# Patient Record
Sex: Male | Born: 1935 | Race: Black or African American | Hispanic: No | Smoking: Former smoker
Health system: Southern US, Community
[De-identification: ages and names within clinical notes are randomized; demographics above are authoritative.]

## PROBLEM LIST (undated history)

## (undated) DIAGNOSIS — D509 Iron deficiency anemia, unspecified: Principal | ICD-10-CM

## (undated) DIAGNOSIS — Z87442 Personal history of urinary calculi: Secondary | ICD-10-CM

## (undated) DIAGNOSIS — I471 Supraventricular tachycardia, unspecified: Secondary | ICD-10-CM

## (undated) DIAGNOSIS — I5032 Chronic diastolic (congestive) heart failure: Secondary | ICD-10-CM

## (undated) DIAGNOSIS — I10 Essential (primary) hypertension: Principal | ICD-10-CM

## (undated) DIAGNOSIS — I498 Other specified cardiac arrhythmias: Secondary | ICD-10-CM

## (undated) DIAGNOSIS — Z950 Presence of cardiac pacemaker: Secondary | ICD-10-CM

## (undated) DIAGNOSIS — G6181 Chronic inflammatory demyelinating polyneuritis: Secondary | ICD-10-CM

## (undated) DIAGNOSIS — C7951 Secondary malignant neoplasm of bone: Secondary | ICD-10-CM

## (undated) DIAGNOSIS — C9000 Multiple myeloma not having achieved remission: Secondary | ICD-10-CM

## (undated) DIAGNOSIS — F17200 Nicotine dependence, unspecified, uncomplicated: Secondary | ICD-10-CM

## (undated) DIAGNOSIS — J45909 Unspecified asthma, uncomplicated: Secondary | ICD-10-CM

## (undated) DIAGNOSIS — Z87898 Personal history of other specified conditions: Secondary | ICD-10-CM

## (undated) HISTORY — DX: Presence of cardiac pacemaker: Z95.0

## (undated) HISTORY — DX: Other specified cardiac arrhythmias: I49.8

## (undated) HISTORY — DX: Secondary malignant neoplasm of bone: C79.51

## (undated) HISTORY — DX: Personal history of urinary calculi: Z87.442

## (undated) HISTORY — DX: Personal history of other specified conditions: Z87.898

## (undated) HISTORY — DX: Multiple myeloma not having achieved remission: C90.00

## (undated) HISTORY — DX: Essential (primary) hypertension: I10

## (undated) HISTORY — DX: Nicotine dependence, unspecified, uncomplicated: F17.200

## (undated) HISTORY — DX: Iron deficiency anemia, unspecified: D50.9

## (undated) HISTORY — PX: COLONOSCOPY: SHX174

## (undated) HISTORY — DX: Unspecified asthma, uncomplicated: J45.909

---

## 1960-05-13 HISTORY — PX: SKIN GRAFT: SHX250

## 2003-05-14 DIAGNOSIS — I498 Other specified cardiac arrhythmias: Secondary | ICD-10-CM

## 2003-05-14 DIAGNOSIS — Z950 Presence of cardiac pacemaker: Secondary | ICD-10-CM

## 2003-05-14 HISTORY — DX: Presence of cardiac pacemaker: Z95.0

## 2003-05-14 HISTORY — PX: PACEMAKER INSERTION: SHX728

## 2003-05-14 HISTORY — DX: Other specified cardiac arrhythmias: I49.8

## 2005-10-03 ENCOUNTER — Ambulatory Visit: Payer: Self-pay | Admitting: Internal Medicine

## 2006-01-16 ENCOUNTER — Ambulatory Visit: Payer: Self-pay | Admitting: Internal Medicine

## 2006-05-19 ENCOUNTER — Ambulatory Visit: Payer: Self-pay | Admitting: Internal Medicine

## 2006-11-21 ENCOUNTER — Telehealth (INDEPENDENT_AMBULATORY_CARE_PROVIDER_SITE_OTHER): Payer: Self-pay

## 2006-11-21 ENCOUNTER — Encounter: Payer: Self-pay | Admitting: Internal Medicine

## 2006-11-21 ENCOUNTER — Ambulatory Visit: Payer: Self-pay | Admitting: Internal Medicine

## 2006-11-21 DIAGNOSIS — I10 Essential (primary) hypertension: Secondary | ICD-10-CM

## 2006-11-21 DIAGNOSIS — Z87442 Personal history of urinary calculi: Secondary | ICD-10-CM

## 2006-11-21 DIAGNOSIS — N400 Enlarged prostate without lower urinary tract symptoms: Secondary | ICD-10-CM

## 2006-11-21 DIAGNOSIS — Z86010 Personal history of colonic polyps: Secondary | ICD-10-CM

## 2006-11-21 HISTORY — DX: Essential (primary) hypertension: I10

## 2006-11-21 HISTORY — DX: Personal history of urinary calculi: Z87.442

## 2006-11-21 LAB — CONVERTED CEMR LAB
ALT: 18 units/L (ref 0–53)
AST: 26 units/L (ref 0–37)
Alkaline Phosphatase: 69 units/L (ref 39–117)
BUN: 12 mg/dL (ref 6–23)
Basophils Relative: 0.7 % (ref 0.0–1.0)
CO2: 31 meq/L (ref 19–32)
Calcium: 9.3 mg/dL (ref 8.4–10.5)
Chloride: 104 meq/L (ref 96–112)
Cholesterol: 204 mg/dL (ref 0–200)
Creatinine, Ser: 1 mg/dL (ref 0.4–1.5)
Hemoglobin: 12.5 g/dL — ABNORMAL LOW (ref 13.0–17.0)
Monocytes Absolute: 0.4 10*3/uL (ref 0.2–0.7)
Monocytes Relative: 10.6 % (ref 3.0–11.0)
Platelets: 185 10*3/uL (ref 150–400)
RBC: 4.35 M/uL (ref 4.22–5.81)
RDW: 13.5 % (ref 11.5–14.6)
Total Bilirubin: 1 mg/dL (ref 0.3–1.2)
Total CHOL/HDL Ratio: 2.7
Total Protein: 6.8 g/dL (ref 6.0–8.3)
VLDL: 14 mg/dL (ref 0–40)

## 2006-12-02 ENCOUNTER — Ambulatory Visit: Payer: Self-pay | Admitting: Internal Medicine

## 2007-03-09 ENCOUNTER — Encounter: Payer: Self-pay | Admitting: Internal Medicine

## 2007-03-19 ENCOUNTER — Ambulatory Visit: Payer: Self-pay | Admitting: Internal Medicine

## 2007-05-25 ENCOUNTER — Ambulatory Visit: Payer: Self-pay | Admitting: Internal Medicine

## 2007-06-17 ENCOUNTER — Ambulatory Visit: Payer: Self-pay | Admitting: Internal Medicine

## 2007-11-03 ENCOUNTER — Ambulatory Visit: Payer: Self-pay | Admitting: Internal Medicine

## 2007-11-24 ENCOUNTER — Ambulatory Visit: Payer: Self-pay | Admitting: Internal Medicine

## 2007-11-24 LAB — CONVERTED CEMR LAB
ALT: 21 units/L (ref 0–53)
AST: 29 units/L (ref 0–37)
Basophils Relative: 0.7 % (ref 0.0–1.0)
Bilirubin, Direct: 0.1 mg/dL (ref 0.0–0.3)
CO2: 28 meq/L (ref 19–32)
Calcium: 9.4 mg/dL (ref 8.4–10.5)
Chloride: 105 meq/L (ref 96–112)
Creatinine, Ser: 0.9 mg/dL (ref 0.4–1.5)
Glucose, Bld: 91 mg/dL (ref 70–99)
HDL: 74.4 mg/dL (ref >39.0)
Hemoglobin: 12.9 g/dL — ABNORMAL LOW (ref 13.0–17.0)
Lymphocytes Relative: 43 % (ref 12.0–46.0)
Monocytes Relative: 11.3 % (ref 3.0–12.0)
Neutro Abs: 1.5 10*3/uL (ref 1.4–7.7)
Neutrophils Relative %: 41.8 % — ABNORMAL LOW (ref 43.0–77.0)
PSA: 0.86 ng/mL (ref 0.10–4.00)
RBC: 4.4 M/uL (ref 4.22–5.81)
Sodium: 141 meq/L (ref 135–145)
Total Bilirubin: 0.9 mg/dL (ref 0.3–1.2)
Total CHOL/HDL Ratio: 2.8
Total Protein: 6.6 g/dL (ref 6.0–8.3)
VLDL: 11 mg/dL (ref 0–40)

## 2007-12-07 ENCOUNTER — Ambulatory Visit: Payer: Self-pay | Admitting: Gastroenterology

## 2007-12-23 ENCOUNTER — Ambulatory Visit: Payer: Self-pay | Admitting: Gastroenterology

## 2008-06-02 ENCOUNTER — Telehealth: Payer: Self-pay | Admitting: Internal Medicine

## 2008-06-03 ENCOUNTER — Ambulatory Visit: Payer: Self-pay | Admitting: Internal Medicine

## 2008-09-01 ENCOUNTER — Encounter (INDEPENDENT_AMBULATORY_CARE_PROVIDER_SITE_OTHER): Payer: Self-pay | Admitting: *Deleted

## 2008-10-25 DIAGNOSIS — D126 Benign neoplasm of colon, unspecified: Secondary | ICD-10-CM | POA: Insufficient documentation

## 2008-10-25 DIAGNOSIS — F17200 Nicotine dependence, unspecified, uncomplicated: Secondary | ICD-10-CM

## 2008-10-25 DIAGNOSIS — R001 Bradycardia, unspecified: Secondary | ICD-10-CM

## 2008-10-25 DIAGNOSIS — Z87898 Personal history of other specified conditions: Secondary | ICD-10-CM

## 2008-10-25 DIAGNOSIS — Z950 Presence of cardiac pacemaker: Secondary | ICD-10-CM

## 2008-10-25 DIAGNOSIS — N200 Calculus of kidney: Secondary | ICD-10-CM | POA: Insufficient documentation

## 2008-10-25 HISTORY — DX: Nicotine dependence, unspecified, uncomplicated: F17.200

## 2008-10-25 HISTORY — DX: Personal history of other specified conditions: Z87.898

## 2008-11-07 ENCOUNTER — Ambulatory Visit: Payer: Self-pay | Admitting: Internal Medicine

## 2008-12-01 ENCOUNTER — Ambulatory Visit: Payer: Self-pay | Admitting: Internal Medicine

## 2008-12-01 LAB — CONVERTED CEMR LAB
ALT: 42 units/L (ref 0–53)
AST: 48 units/L — ABNORMAL HIGH (ref 0–37)
Albumin: 4 g/dL (ref 3.5–5.2)
Alkaline Phosphatase: 67 units/L (ref 39–117)
Basophils Absolute: 0 10*3/uL (ref 0.0–0.1)
Basophils Relative: 0.8 % (ref 0.0–3.0)
Calcium: 9.7 mg/dL (ref 8.4–10.5)
Eosinophils Relative: 5 % (ref 0.0–5.0)
GFR calc non Af Amer: 76.79 mL/min (ref >60)
Glucose, Bld: 99 mg/dL (ref 70–99)
HCT: 38.7 % — ABNORMAL LOW (ref 39.0–52.0)
HDL: 83.4 mg/dL (ref >39.00)
Hemoglobin: 13.1 g/dL (ref 13.0–17.0)
Lymphocytes Relative: 48.5 % — ABNORMAL HIGH (ref 12.0–46.0)
Lymphs Abs: 1.8 10*3/uL (ref 0.7–4.0)
Monocytes Relative: 11.3 % (ref 3.0–12.0)
Neutro Abs: 1.2 10*3/uL — ABNORMAL LOW (ref 1.4–7.7)
Potassium: 5 meq/L (ref 3.5–5.1)
RBC: 4.49 M/uL (ref 4.22–5.81)
RDW: 13.6 % (ref 11.5–14.6)
Sodium: 147 meq/L — ABNORMAL HIGH (ref 135–145)
TSH: 0.76 microintl units/mL (ref 0.35–5.50)
Total CHOL/HDL Ratio: 3
Triglycerides: 76 mg/dL (ref 0.0–149.0)
WBC: 3.6 10*3/uL — ABNORMAL LOW (ref 4.5–10.5)

## 2008-12-08 ENCOUNTER — Emergency Department (HOSPITAL_COMMUNITY): Admission: EM | Admit: 2008-12-08 | Discharge: 2008-12-08 | Payer: Self-pay | Admitting: Family Medicine

## 2009-02-09 ENCOUNTER — Encounter: Payer: Self-pay | Admitting: Internal Medicine

## 2009-02-16 ENCOUNTER — Encounter: Payer: Self-pay | Admitting: Internal Medicine

## 2009-02-16 ENCOUNTER — Ambulatory Visit: Payer: Self-pay

## 2009-05-17 ENCOUNTER — Ambulatory Visit: Payer: Self-pay | Admitting: Internal Medicine

## 2009-06-01 ENCOUNTER — Encounter: Payer: Self-pay | Admitting: Internal Medicine

## 2009-08-16 ENCOUNTER — Ambulatory Visit: Payer: Self-pay | Admitting: Internal Medicine

## 2009-08-30 ENCOUNTER — Encounter: Payer: Self-pay | Admitting: Internal Medicine

## 2009-11-09 ENCOUNTER — Telehealth: Payer: Self-pay | Admitting: Internal Medicine

## 2009-11-21 ENCOUNTER — Ambulatory Visit: Payer: Self-pay | Admitting: Internal Medicine

## 2009-12-18 ENCOUNTER — Telehealth: Payer: Self-pay | Admitting: *Deleted

## 2010-01-12 ENCOUNTER — Telehealth: Payer: Self-pay | Admitting: Internal Medicine

## 2010-01-17 ENCOUNTER — Telehealth: Payer: Self-pay | Admitting: Internal Medicine

## 2010-02-06 ENCOUNTER — Telehealth: Payer: Self-pay | Admitting: Internal Medicine

## 2010-02-13 ENCOUNTER — Telehealth: Payer: Self-pay | Admitting: Internal Medicine

## 2010-02-22 ENCOUNTER — Ambulatory Visit: Payer: Self-pay | Admitting: Internal Medicine

## 2010-02-22 ENCOUNTER — Telehealth: Payer: Self-pay | Admitting: Internal Medicine

## 2010-02-28 ENCOUNTER — Encounter: Payer: Self-pay | Admitting: Internal Medicine

## 2010-03-01 ENCOUNTER — Ambulatory Visit: Payer: Self-pay | Admitting: Internal Medicine

## 2010-05-24 ENCOUNTER — Encounter: Payer: Self-pay | Admitting: Internal Medicine

## 2010-05-24 ENCOUNTER — Ambulatory Visit
Admission: RE | Admit: 2010-05-24 | Discharge: 2010-05-24 | Payer: Self-pay | Source: Home / Self Care | Attending: Internal Medicine | Admitting: Internal Medicine

## 2010-06-12 NOTE — Progress Notes (Signed)
Summary: refill avapro - must be seen  Phone Note Refill Request Message from:  Fax from Pharmacy on February 13, 2010 10:24 AM  Refills Requested: Medication #1:  AVAPRO 300 MG TABS 1 once daily [facility]    Method Requested: Fax to Local Pharmacy Initial call taken by: Duard Brady LPN,  February 13, 2010 10:24 AM    Prescriptions: AVAPRO 300 MG TABS (IRBESARTAN) 1 once daily  #15 x 0   Entered by:   Duard Brady LPN   Authorized by:   Gordy Savers  MD   Signed by:   Duard Brady LPN on 21/30/8657   Method used:   Historical   RxID:   [id number]  must be seen - last seen 10/2008 , lasr efill was given #30 with same instructions   - [facility]   KIK  Appended Document: refill avapro - must be seen pt stopped by office - i called pharmacy - gave ok for #30 - pt made appt to be seen - understands he must keep appt for future refills. KIK

## 2010-06-12 NOTE — Progress Notes (Signed)
Summary: refill cardizem and flomax - #15 - need to see  Phone Note Refill Request Message from:  Fax from Pharmacy on February 06, 2010 11:59 AM  Refills Requested: Medication #1:  CARDIZEM LA 240 MG TB24 1 once daily   Last Refilled: 01/17/2010  Medication #2:  FLOMAX 0.4 MG  CP24 1 once daily.   Last Refilled: 01/17/2010 [facility]   Method Requested: Fax to Local Pharmacy Initial call taken by: Duard Brady LPN,  February 06, 2010 12:00 PM    Prescriptions: FLOMAX 0.4 MG  CP24 (TAMSULOSIN HCL) 1 once daily  #15 x 0   Entered by:   Duard Brady LPN   Authorized by:   Gordy Savers  MD   Signed by:   Duard Brady LPN on 81/19/1478   Method used:   Faxed to ...       [facility] # [phone]* (retail)       [address]       [place], [place]  [address]       Ph: [phone]       Fax: [phone]   RxID:   [id number] CARDIZEM LA 240 MG TB24 (DILTIAZEM HCL COATED BEADS) 1 once daily  #15 x 0   Entered by:   Duard Brady LPN   Authorized by:   Gordy Savers  MD   Signed by:   Duard Brady LPN on 59/56/3875   Method used:   Faxed to ...       [facility] # [phone]* (retail)       [address]       [place], [place]  [address]       Ph: [phone]       Fax: [phone]   RxID:   [id number]

## 2010-06-12 NOTE — Letter (Signed)
Summary: Remote Device Check  [facility] Main Office  [address]   [place], [place] [address]   Phone: [phone]  Fax: [phone]     June 01, 2009 MRN: [id number]   [facility] [address] [place], [place]  [address]   Dear Mr. [facility],   Your remote transmission was recieved and reviewed by your physician.  All diagnostics were within normal limits for you.  __X___Your next transmission is scheduled for:   August 16, 2009.  Please transmit at any time this day.  If you have a wireless device your transmission will be sent automatically.     Sincerely,  Proofreader

## 2010-06-12 NOTE — Progress Notes (Signed)
Summary: refill   Phone Note Refill Request Message from:  Fax from Pharmacy on January 17, 2010 12:20 PM  Refills Requested: Medication #1:  CARDIZEM LA 240 MG TB24 1 once daily  Medication #2:  FLOMAX 0.4 MG  CP24 1 once daily. [facility]   Method Requested: Fax to Local Pharmacy Initial call taken by: Duard Brady LPN,  January 17, 2010 12:20 PM    Prescriptions: FLOMAX 0.4 MG  CP24 (TAMSULOSIN HCL) 1 once daily  #15 x 0   Entered by:   Duard Brady LPN   Authorized by:   Gordy Savers  MD   Signed by:   Duard Brady LPN on 62/37/6283   Method used:   Faxed to ...       [facility] # [phone]* (retail)       [address]       [place], [place]  [address]       Ph: [phone]       Fax: [phone]   RxID:   [id number] CARDIZEM LA 240 MG TB24 (DILTIAZEM HCL COATED BEADS) 1 once daily  #15 x 0   Entered by:   Duard Brady LPN   Authorized by:   Gordy Savers  MD   Signed by:   Duard Brady LPN on 38/02/1750   Method used:   Faxed to ...       [facility] [address] # [phone]* (retail)       [address]       [place], [place]  [address]       Ph: [phone]       Fax: [phone]   RxID:   [id number]  needs to be seen - last seen 11/2008 - was given a 30 day then a 15 day rx with these instructions    KIK

## 2010-06-12 NOTE — Progress Notes (Signed)
Summary: short term refill - MUST be seen   Phone Note Refill Request   Refills Requested: Medication #1:  CARDIZEM LA 240 MG TB24 1 once daily  Medication #2:  FLOMAX 0.4 MG  CP24 1 once daily. [facility]   Method Requested: Fax to Local Pharmacy Initial call taken by: Duard Brady LPN,  February 22, 2010 10:14 AM  Follow-up for Phone Call        faxed back refill # 15 only must be seen - last seen 10/2008 KIK Follow-up by: Duard Brady LPN,  February 22, 2010 12:02 PM    Prescriptions: FLOMAX 0.4 MG  CP24 (TAMSULOSIN HCL) 1 once daily  #15 x 0   Entered by:   Duard Brady LPN   Authorized by:   Gordy Savers  MD   Signed by:   Duard Brady LPN on 91/47/8295   Method used:   Historical   RxID:   [id number] CARDIZEM LA 240 MG TB24 (DILTIAZEM HCL COATED BEADS) 1 once daily  #15 x 0   Entered by:   Duard Brady LPN   Authorized by:   Gordy Savers  MD   Signed by:   Duard Brady LPN on 52/84/1324   Method used:   Historical   RxID:   [id number]

## 2010-06-12 NOTE — Progress Notes (Signed)
Summary: refill avapro  Phone Note Refill Request Message from:  Fax from Pharmacy on January 12, 2010 4:34 PM  Refills Requested: Medication #1:  AVAPRO 300 MG TABS 1 once daily   Last Refilled: 11/10/2009 [facility] [address]    Caryn Section Requested: Fax to Local Pharmacy Initial call taken by: Duard Brady LPN,  January 12, 2010 4:35 PM    Prescriptions: AVAPRO 300 MG TABS (IRBESARTAN) 1 once daily  #30 x 0   Entered by:   Duard Brady LPN   Authorized by:   Gordy Savers  MD   Signed by:   Duard Brady LPN on 16/02/9603   Method used:   Electronically to        [facility] # [phone]* (retail)       [address]       [place], [place]  [address]       Ph: [phone]       Fax: [phone]   RxID:   [id number]

## 2010-06-12 NOTE — Assessment & Plan Note (Signed)
Summary: pc2/sl      Allergies Added: NKDA  Visit Type:  Follow-up Primary Provider:  Gordy Savers  MD   History of Present Illness: Jonathon [name] returns today for PPM followup.  He is a pleasant 75 yo man with a h/o PAF and bradycardia, who underwent PPM in 2005.  He initially moved away but moved back to [place] and returns for evaluation.  He denies c/p or sob or peripheral edema.  He has had no trouble working over the past few years.  Current Medications (verified): 1)  Avapro 300 Mg Tabs (Irbesartan) .Marland Kitchen.. 1 Once Daily 2)  Cardizem La 240 Mg Tb24 (Diltiazem Hcl Coated Beads) .Marland Kitchen.. 1 Once Daily 3)  Flomax 0.4 Mg  Cp24 (Tamsulosin Hcl) .Marland Kitchen.. 1 Once Daily  Allergies (verified): No Known Drug Allergies  Past History:  Past Medical History: Last updated: 10/25/2008 NEPHROLITHIASIS (ICD-592.0) COLONIC POLYPS (ICD-211.3) TOBACCO ABUSE (ICD-305.1) BENIGN PROSTATIC HYPERTROPHY, HX OF (ICD-V13.8) PACEMAKER, PERMANENT (ICD-V45.01) BRADYCARDIA (ICD-427.89) NEPHROLITHIASIS, HX OF (ICD-V13.01) COLONIC POLYPS, HX OF (ICD-V12.72) BENIGN PROSTATIC HYPERTROPHY (ICD-600.00) HYPERTENSION (ICD-401.9)  Past Surgical History: Last updated: 12/01/2008 status post pacemaker insertion February 05. Kidney stone extraction 1981  colonoscopy 2004,2009  Review of Systems  The patient denies chest pain, syncope, dyspnea on exertion, and peripheral edema.    Vital Signs:  Patient profile:   75 year old male Height:      71 inches Weight:      196 pounds BMI:     27.44 Pulse rate:   60 / minute BP sitting:   146 / 78  (left arm)  Vitals Entered By: Laurance Flatten CMA (November 21, 2009 2:56 PM)  Physical Exam  General:  Well-developed,well-nourished,in no acute distress; alert,appropriate and cooperative throughout examination Head:  Normocephalic and atraumatic without obvious abnormalities. No apparent alopecia or balding. Mouth:  Oral mucosa and oropharynx without lesions or  exudates.  Teeth in good repair. Neck:  No deformities, masses, or tenderness noted. Chest Wall:  No deformities, masses, tenderness or gynecomastia noted. Lungs:  Normal respiratory effort, chest expands symmetrically. Lungs are clear to auscultation, no crackles or wheezes. Heart:  Normal rate and regular rhythm. S1 and S2 normal without gallop, murmur, click, rub or other extra sounds. Abdomen:  Bowel sounds positive,abdomen soft and non-tender without masses, organomegaly or hernias noted. Msk:  No deformity or scoliosis noted of thoracic or lumbar spine.   Pulses:  R and L carotid,radial,femoral,dorsalis pedis and posterior tibial pulses are full and equal bilaterally Extremities:  No clubbing, cyanosis, edema, or deformity noted with normal full range of motion of all joints.     PPM Specifications Following MD:  Jonathon Bunting, MD     PPM Vendor:  [employer]onic     PPM Model Number:  [id number]     PPM Serial Number:  [id number] PPM DOI:  06/03/2003     PPM Implanting MD:  Jonathon Bunting, MD  Lead 1    Location: RA     DOI: 06/03/2003     Model #: [id number]     Serial #: [id number]     Status: active Lead 2    Location: RV     DOI: 06/03/2003     Model #: 5621     Serial #: [id number]     Status: active   Indications:  BRADY   PPM Follow Up Battery Voltage:  2.76 V     Battery Est. Longevity:  15yrs     Pacer Dependent:  No  PPM Device Measurements Atrium  Amplitude: 5.60 mV, Impedance: 441 ohms, Threshold: 0.50 V at 0.40 msec Right Ventricle  Amplitude: 11.20 mV, Impedance: 552 ohms, Threshold: 0.50 V at 0.40 msec  Episodes MS Episodes:  13     Percent Mode Switch:  <0.1%     Coumadin:  No Ventricular High Rate:  10     Atrial Pacing:  5.1%     Ventricular Pacing:  13.1%  Parameters Mode:  DDD     Lower Rate Limit:  60     Upper Rate Limit:  130 Paced AV Delay:  250     Sensed AV Delay:  250 Next Cardiology Appt Due:  05/14/2010 Tech Comments:  NORMAL DEVICE FUNCTION.  10 VHR  EPISODES-LONGEST WAS 49 SECONDS.  CHANGED RA OUTPUT FROM 1.5 TO 2.0 V.  ROV IN 6 MTHS.  Vella Kohler  November 21, 2009 3:14 PM MD Comments:  Agree with above.  Impression & Recommendations:  Problem # 1:  PACEMAKER, PERMANENT (ICD-V45.01) His device is working normally.  Will recheck in several months.  Problem # 2:  TOBACCO ABUSE (ICD-305.1) I counciled the patient on the importance of stopping smoking.  Problem # 3:  HYPERTENSION (ICD-401.9) His blood pressure is fairly well controlled.  He will continue to try to stop smoking.   His updated medication list for this problem includes:    Avapro 300 Mg Tabs (Irbesartan) .Marland Kitchen... 1 once daily    Cardizem La 240 Mg Tb24 (Diltiazem hcl coated beads) .Marland Kitchen... 1 once daily

## 2010-06-12 NOTE — Assessment & Plan Note (Signed)
Summary: fu on meds/njr   Vital Signs:  Patient profile:   75 year old male Height:      71 inches Weight:      196 pounds BMI:     27.44 Temp:     98.3 degrees F oral Pulse rate:   681 / minute Resp:     14 per minute BP sitting:   136 / 80  (left arm)  Vitals Entered By: Willy Eddy, LPN (March 01, 2010 3:19 PM) CC: roa Is Patient Diabetic? No   Primary Care Provider:  Gordy Savers  MD  CC:  roa.  History of Present Illness: 75 year old patient who is seen today for follow-up.  He has a history of bradycardia status post pacemaker insertion.  He has hypertension and a history of BPH.  He has nephrolithiasis and history of tobacco use.  He denies any cardiopulmonary complaints  Preventive Screening-Counseling & Management  Alcohol-Tobacco     Smoking Status: current     Smoking Cessation Counseling: yes  Current Problems (verified): 1)  Preventive Health Care  (ICD-V70.0) 2)  Nephrolithiasis  (ICD-592.0) 3)  Colonic Polyps  (ICD-211.3) 4)  Tobacco Abuse  (ICD-305.1) 5)  Benign Prostatic Hypertrophy, Hx of  (ICD-V13.8) 6)  Pacemaker, Permanent  (ICD-V45.01) 7)  Bradycardia  (ICD-427.89) 8)  Nephrolithiasis, Hx of  (ICD-V13.01) 9)  Colonic Polyps, Hx of  (ICD-V12.72) 10)  Benign Prostatic Hypertrophy  (ICD-600.00) 11)  Hypertension  (ICD-401.9)  Current Medications (verified): 1)  Avapro 300 Mg Tabs (Irbesartan) .Marland Kitchen.. 1 Once Daily 2)  Cardizem La 240 Mg Tb24 (Diltiazem Hcl Coated Beads) .Marland Kitchen.. 1 Once Daily 3)  Flomax 0.4 Mg  Cp24 (Tamsulosin Hcl) .Marland Kitchen.. 1 Once Daily  Allergies (verified): No Known Drug Allergies  Past History:  Past Medical History: Reviewed history from 10/25/2008 and no changes required. NEPHROLITHIASIS (ICD-592.0) COLONIC POLYPS (ICD-211.3) TOBACCO ABUSE (ICD-305.1) BENIGN PROSTATIC HYPERTROPHY, HX OF (ICD-V13.8) PACEMAKER, PERMANENT (ICD-V45.01) BRADYCARDIA (ICD-427.89) NEPHROLITHIASIS, HX OF (ICD-V13.01) COLONIC POLYPS, HX  OF (ICD-V12.72) BENIGN PROSTATIC HYPERTROPHY (ICD-600.00) HYPERTENSION (ICD-401.9)  Past Surgical History: Reviewed history from 12/01/2008 and no changes required. status post pacemaker insertion February 05. Kidney stone extraction 1981  colonoscopy 2004,2009  Family History: Reviewed history from 11/24/2007 and no changes required. father died at 48 of an MI.  history of. chronic kidney disease.  Mother died 32.  History Parkinson's disease one brother history type 2 diabetes. two  sisters, history of breast cancer (both)  Review of Systems  The patient denies anorexia, fever, weight loss, weight gain, vision loss, decreased hearing, hoarseness, chest pain, syncope, dyspnea on exertion, peripheral edema, prolonged cough, headaches, hemoptysis, abdominal pain, melena, hematochezia, severe indigestion/heartburn, hematuria, incontinence, genital sores, muscle weakness, suspicious skin lesions, transient blindness, difficulty walking, depression, unusual weight change, abnormal bleeding, enlarged lymph nodes, angioedema, breast masses, and testicular masses.    Physical Exam  General:  Well-developed,well-nourished,in no acute distress; alert,appropriate and cooperative throughout examination Head:  Normocephalic and atraumatic without obvious abnormalities. No apparent alopecia or balding. Eyes:  No corneal or conjunctival inflammation noted. EOMI. Perrla. Funduscopic exam benign, without hemorrhages, exudates or papilledema. Vision grossly normal. Mouth:  Oral mucosa and oropharynx without lesions or exudates.  Teeth in good repair. Neck:  No deformities, masses, or tenderness noted. Lungs:  Normal respiratory effort, chest expands symmetrically. Lungs are clear to auscultation, no crackles or wheezes. Heart:  Normal rate and regular rhythm. S1 and S2 normal without gallop, murmur, click, rub or other extra sounds. Abdomen:  Bowel sounds positive,abdomen soft and non-tender without  masses, organomegaly or hernias noted. Extremities:  no edema   Impression & Recommendations:  Problem # 1:  HYPERTENSION (ICD-401.9)  His updated medication list for this problem includes:    Avapro 300 Mg Tabs (Irbesartan) .Marland Kitchen... 1 once daily    Cardizem La 240 Mg Tb24 (Diltiazem hcl coated beads) .Marland Kitchen... 1 once daily  His updated medication list for this problem includes:    Avapro 300 Mg Tabs (Irbesartan) .Marland Kitchen... 1 once daily    Cardizem La 240 Mg Tb24 (Diltiazem hcl coated beads) .Marland Kitchen... 1 once daily  Problem # 2:  BENIGN PROSTATIC HYPERTROPHY, HX OF (ICD-V13.8)  Problem # 3:  TOBACCO ABUSE (ICD-305.1) cessation of tobacco encouraged  Complete Medication List: 1)  Avapro 300 Mg Tabs (Irbesartan) .Marland Kitchen.. 1 once daily 2)  Cardizem La 240 Mg Tb24 (Diltiazem hcl coated beads) .Marland Kitchen.. 1 once daily 3)  Flomax 0.4 Mg Cp24 (Tamsulosin hcl) .Marland Kitchen.. 1 once daily  Patient Instructions: 1)  Please schedule a follow-up appointment in 6 months for CPX  2)  Advised not to eat any food or drink any liquids after 10 PM the night before your procedure. 3)  Limit your Sodium (Salt). 4)  Tobacco is very bad for your health and your loved ones! You Should stop smoking!. 5)  It is important that you exercise regularly at least 20 minutes 5 times a week. If you develop chest pain, have severe difficulty breathing, or feel very tired , stop exercising immediately and seek medical attention. Prescriptions: FLOMAX 0.4 MG  CP24 (TAMSULOSIN HCL) 1 once daily  #90 x 3   Entered and Authorized by:   Gordy Savers  MD   Signed by:   Gordy Savers  MD on 03/01/2010   Method used:   Electronically to        [facility] # [phone]* (retail)       [address]       [place], [place]  [address]       Ph: [phone]       Fax: [phone]   RxID:   [id number] CARDIZEM LA 240 MG TB24 (DILTIAZEM HCL COATED BEADS) 1 once daily  #90 x 3   Entered and Authorized by:   Gordy Savers  MD   Signed  by:   Gordy Savers  MD on 03/01/2010   Method used:   Electronically to        [facility] # [phone]* (retail)       [address]       [place], [place]  [address]       Ph: [phone]       Fax: [phone]   RxID:   [id number] AVAPRO 300 MG TABS (IRBESARTAN) 1 once daily  #90 x 3   Entered and Authorized by:   Gordy Savers  MD   Signed by:   Gordy Savers  MD on 03/01/2010   Method used:   Electronically to        [facility] # [phone]* (retail)       [address]       [place], [place]  [address]       Ph: [phone]       Fax: [phone]   RxID:   [id number]    Orders Added: 1)  Est. Patient Level III [[address]]   Immunization History:  Influenza Immunization History:    Influenza:  historical (02/10/2010)  Immunization History:  Influenza Immunization History:    Influenza:  Historical (02/10/2010)

## 2010-06-12 NOTE — Letter (Signed)
Summary: Remote Device Check  [facility], Main Office  [address]   [place], [place] [address]   Phone: [phone]  Fax: [phone]     August 30, 2009 MRN: [id number]   [facility] [address] [place], [place]  [address]   Dear Mr. [facility],   Your remote transmission was recieved and reviewed by your physician.  All diagnostics were within normal limits for you.    ___X___Your next office visit is scheduled for:    JUNE 2011 WITH DR Ladona Ridgel. Please call our office to schedule an appointment.    Sincerely,  Proofreader

## 2010-06-12 NOTE — Cardiovascular Report (Signed)
Summary: Office Visit Remote   Office Visit Remote   Imported By: Roderic Ovens 06/07/2009 12:23:48  _____________________________________________________________________  External Attachment:    Type:   Image     Comment:   External Document

## 2010-06-12 NOTE — Progress Notes (Signed)
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    Initial call taken by: Debbra Riding,  November 09, 2009 2:22 PM    Prescriptions: AVAPRO 300 MG TABS (IRBESARTAN) 1 once daily  #60 x 0   Entered by:   Duard Brady LPN   Authorized by:   Gordy Savers  MD   Signed by:   Duard Brady LPN on 16/02/9603   Method used:   Electronically to        [facility] [address] # [phone]* (retail)       [address]       [place], [place]  [address]       Ph: [phone]       Fax: [phone]   RxID:   [id number]  disp # 9 - last seen 10/2008 must be seen   KIK

## 2010-06-12 NOTE — Cardiovascular Report (Signed)
Summary: Office Visit Remote   Office Visit Remote   Imported By: Roderic Ovens 03/01/2010 13:30:06  _____________________________________________________________________  External Attachment:    Type:   Image     Comment:   External Document

## 2010-06-12 NOTE — Letter (Signed)
Summary: Remote Device Check  [facility], Main Office  [address]   [place], [place] [address]   Phone: [phone]  Fax: [phone]     February 28, 2010 MRN: [id number]   [facility] [address] [place], [place]  [address]   Dear Mr. [facility],   Your remote transmission was recieved and reviewed by your physician.  All diagnostics were within normal limits for you.  __X___Your next transmission is scheduled for:  05-24-2010.  Please transmit at any time this day.  If you have a wireless device your transmission will be sent automatically.   Sincerely,  Vella Kohler

## 2010-06-12 NOTE — Cardiovascular Report (Signed)
Summary: Office Visit   Office Visit   Imported By: Roderic Ovens 11/24/2009 11:53:06  _____________________________________________________________________  External Attachment:    Type:   Image     Comment:   External Document

## 2010-06-12 NOTE — Cardiovascular Report (Signed)
Summary: Office Visit Remote  Office Visit Remote   Imported By: Roderic Ovens 08/30/2009 14:53:48  _____________________________________________________________________  External Attachment:    Type:   Image     Comment:   External Document

## 2010-06-12 NOTE — Progress Notes (Signed)
Summary: refill  Phone Note From Pharmacy   Caller: [facility] # [phone]* Reason for Call: Needs renewal Details for Reason: Cardizem la 240mg  Initial call taken by: Romualdo Bolk, CMA (AAMA),  December 18, 2009 11:33 AM    Prescriptions: CARDIZEM LA 240 MG TB24 (DILTIAZEM HCL COATED BEADS) 1 once daily  #30 x 0   Entered by:   Romualdo Bolk, CMA (AAMA)   Authorized by:   Gordy Savers  MD   Signed by:   Romualdo Bolk, CMA (AAMA) on 12/18/2009   Method used:   Electronically to        [facility] # [phone]* (retail)       [address]       [place], [place]  [address]       Ph: [phone]       Fax: [phone]   RxID:   [id number]   Appended Document: refill     Clinical Lists Changes  Medications: Rx of FLOMAX 0.4 MG  CP24 (TAMSULOSIN HCL) 1 once daily;  #30 x 0;  Signed;  Entered by: Romualdo Bolk, CMA (AAMA);  Authorized by: Gordy Savers  MD;  Method used: Electronically to [facility] # [phone]*, [address], [place], [place]  [address], Ph: [phone], Fax: [phone]    Prescriptions: FLOMAX 0.4 MG  CP24 (TAMSULOSIN HCL) 1 once daily  #30 x 0   Entered by:   Romualdo Bolk, CMA (AAMA)   Authorized by:   Gordy Savers  MD   Signed by:   Romualdo Bolk, CMA (AAMA) on 12/18/2009   Method used:   Electronically to        [facility] [address] # [phone]* (retail)       [address]       [place], [place]  [address]       Ph: [phone]       Fax: [phone]   RxID:   [id number]

## 2010-06-16 ENCOUNTER — Encounter: Payer: Self-pay | Admitting: Internal Medicine

## 2010-06-20 NOTE — Miscellaneous (Signed)
Summary: corrected device information  Clinical Lists Changes  Observations: Added new observation of MAGNET RTE: BOL 856 ERI  65 (06/16/2010 10:01) Added new observation of PPMLEADSER1: [id number] V (06/16/2010 10:01)      PPM Specifications Following MD:  Lewayne Bunting, MD     PPM Vendor:  [employer]     PPM Model Number:  [id number]     PPM Serial Number:  [id number] PPM DOI:  06/03/2003     PPM Implanting MD:  Lewayne Bunting, MD  Lead 1    Location: RA     DOI: 06/03/2003     Model #: 5784     Serial #: [id number]     Status: active Lead 2    Location: RV     DOI: 06/03/2003     Model #: 1324     Serial #: [id number]     Status: active  Magnet Response Rate:  BOL 856 ERI  65  Indications:  BRADY   PPM Follow Up Pacer Dependent:  No      Episodes Coumadin:  No  Parameters Mode:  DDD     Lower Rate Limit:  60     Upper Rate Limit:  130 Paced AV Delay:  250     Sensed AV Delay:  250

## 2010-06-24 ENCOUNTER — Encounter (INDEPENDENT_AMBULATORY_CARE_PROVIDER_SITE_OTHER): Payer: Self-pay | Admitting: *Deleted

## 2010-07-04 NOTE — Letter (Signed)
Summary: Remote Device Check  [facility], Main Office  [address]   [place], [place] [address]   Phone: [phone]  Fax: [phone]     June 24, 2010 MRN: [id number]   [facility] [address] [place], [place]  [address]   Dear Mr. [facility],   Your remote transmission was recieved and reviewed by your physician.  All diagnostics were within normal limits for you.  __X___Your next transmission is scheduled for:  08-23-2010.  Please transmit at any time this day.  If you have a wireless device your transmission will be sent automatically.  Sincerely,  Vella Kohler

## 2010-07-04 NOTE — Cardiovascular Report (Signed)
Summary: Office Visit Remote   Office Visit Remote   Imported By: Roderic Ovens 06/26/2010 15:11:50  _____________________________________________________________________  External Attachment:    Type:   Image     Comment:   External Document

## 2010-07-07 ENCOUNTER — Inpatient Hospital Stay (INDEPENDENT_AMBULATORY_CARE_PROVIDER_SITE_OTHER)
Admission: RE | Admit: 2010-07-07 | Discharge: 2010-07-07 | Disposition: A | Discharge status: Home / Self Care | Payer: Medicare Other | Source: Ambulatory Visit | Attending: Family Medicine | Admitting: Family Medicine

## 2010-07-07 DIAGNOSIS — R071 Chest pain on breathing: Secondary | ICD-10-CM

## 2010-07-11 ENCOUNTER — Encounter: Payer: Self-pay | Admitting: Internal Medicine

## 2010-07-11 ENCOUNTER — Ambulatory Visit (INDEPENDENT_AMBULATORY_CARE_PROVIDER_SITE_OTHER): Payer: Private Health Insurance - Indemnity | Admitting: Internal Medicine

## 2010-07-11 DIAGNOSIS — I10 Essential (primary) hypertension: Secondary | ICD-10-CM

## 2010-07-11 MED ORDER — TAMSULOSIN HCL 0.4 MG PO CAPS: 0.4000 mg | ORAL_CAPSULE | Freq: Every day | ORAL | Status: DC

## 2010-07-11 MED ORDER — DILTIAZEM HCL ER COATED BEADS 240 MG PO TB24: 240.0000 mg | ORAL_TABLET | Freq: Every day | ORAL | Status: DC

## 2010-07-11 MED ORDER — IRBESARTAN 300 MG PO TABS: 300.0000 mg | ORAL_TABLET | Freq: Every day | ORAL | Status: DC

## 2010-07-11 NOTE — Progress Notes (Signed)
  Subjective:    Patient ID: Jonathon [name], male    DOB: [birth year], 75 y.o.   MRN: [id number]  HPI   75 year old patient who has a history of hypertension. He was evaluated at the urgent care recently the 2 left shoulder pain. 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 He was treated with analgesics and rest and the left shoulder pain has steadily improved. He has been compliant with his blood pressure medication  He discontinued tobacco use approximately one month ago    Review of Systems  Constitutional: Negative for fever, chills, appetite change and fatigue.  HENT: Negative for hearing loss, ear pain, congestion, sore throat, trouble swallowing, neck stiffness, dental problem, voice change and tinnitus.   Eyes: Negative for pain, discharge and visual disturbance.  Respiratory: Negative for cough, chest tightness, wheezing and stridor.   Cardiovascular: Negative for chest pain, palpitations and leg swelling.  Gastrointestinal: Negative for nausea, vomiting, abdominal pain, diarrhea, constipation, blood in stool and abdominal distention.  Genitourinary: Negative for urgency, hematuria, flank pain, discharge, difficulty urinating and genital sores.  Musculoskeletal: Negative for myalgias, back pain, joint swelling, arthralgias and gait problem.       [ Improving left shoulder pain Skin: Negative for rash.  Neurological: Negative for dizziness, syncope, speech difficulty, weakness, numbness and headaches.  Hematological: Negative for adenopathy. Does not bruise/bleed easily.  Psychiatric/Behavioral: Negative for behavioral problems and dysphoric mood. The patient is not nervous/anxious.        Objective:   Physical Exam  Constitutional: He is oriented to person, place, and time. He appears well-developed.  HENT:  Head: Normocephalic.  Right Ear: External ear normal.  Left Ear: External ear normal.    Eyes: Conjunctivae and EOM are normal.  Neck: Normal range of motion.  Cardiovascular: Normal rate and normal heart sounds.   Pulmonary/Chest: Breath sounds normal.  Abdominal: Bowel sounds are normal.  Musculoskeletal: Normal range of motion. He exhibits no edema and no tenderness.  Neurological: He is alert and oriented to person, place, and time.  Psychiatric: He has a normal mood and affect. His behavior is normal.          Assessment & Plan:   left shoulder strain improving  Hypertension stable   Medications refilled. The patient does have a physical scheduled in 2 months we'll reassess laboratory studies and perform a full examination at that time

## 2010-07-11 NOTE — Patient Instructions (Signed)
Limit your sodium (Salt) intake  Please check your blood pressure on a regular basis.  If it is consistently greater than 150/90, please make an office appointment.  Return in 6 months for follow-up    It is important that you exercise regularly, at least 20 minutes 3 to 4 times per week.  If you develop chest pain or shortness of breath seek  medical attention.   

## 2010-08-23 ENCOUNTER — Encounter: Payer: Self-pay | Admitting: *Deleted

## 2010-08-28 ENCOUNTER — Encounter: Payer: Self-pay | Admitting: *Deleted

## 2010-08-31 ENCOUNTER — Other Ambulatory Visit: Payer: Self-pay | Admitting: Internal Medicine

## 2010-08-31 ENCOUNTER — Ambulatory Visit (INDEPENDENT_AMBULATORY_CARE_PROVIDER_SITE_OTHER): Payer: Medicare Other | Admitting: *Deleted

## 2010-08-31 DIAGNOSIS — I495 Sick sinus syndrome: Secondary | ICD-10-CM

## 2010-08-31 DIAGNOSIS — Z950 Presence of cardiac pacemaker: Secondary | ICD-10-CM

## 2010-09-04 ENCOUNTER — Encounter: Payer: Self-pay | Admitting: Internal Medicine

## 2010-09-04 NOTE — Progress Notes (Signed)
Pacer remote check  

## 2010-09-05 ENCOUNTER — Encounter: Payer: Self-pay | Admitting: *Deleted

## 2010-09-25 NOTE — Assessment & Plan Note (Signed)
[facility]                         ELECTROPHYSIOLOGY OFFICE NOTE   NAME:Jonathon [name]                            MRN:          [id number]  DATE:11/03/2007                            DOB:          April 22, 1936    [name] returns today for followup.  He is a very pleasant 75 year old  man who is retired, who has a history of significant bradycardia and  hypertension.  He underwent a permanent pacemaker insertion back in  2005.  He returns today for followup.  He is still working on a part-  time basis and feels well.  He denies chest pain, shortness of breath,  or peripheral edema.   MEDICATIONS:  1. Avapro 300 a day.  2. Cardizem 240 a day.  3. Flomax 0.4 a day.   PHYSICAL EXAMINATION:  GENERAL:  He is a pleasant well-appearing 71-year-  old man in no acute distress.  VITAL SIGNS:  Blood pressure today was 130/83.  The pulse is 73 and  regular, respirations 18, and weight was 195 pounds.  NECK:  No jugular venous distention.  LUNGS:  Clear bilaterally to auscultation.  No wheezes, rales or rhonchi  are present.  CARDIOVASCULAR:  Regular rate and rhythm.  Normal S1and S2.  No murmurs,  rubs, or gallops present.  PMI was not enlarged nor was it laterally  displaced.  ABDOMEN:  Soft, nontender, and nondistended.  There is no organomegaly.  EXTREMITIES:  No cyanosis, clubbing, or edema.  The pulses are 2+ and  symmetric.   Interrogation of his defibrillator demonstrates a Medtronic Impulse.  The P-waves are greater than 2, R waves are greater than 11.  The  impedance 524 in the A, 525 in the V, the threshold 0.6 at 0.4 and 0.5  at 0.4 in the RV.  Battery voltage was 2.78 volts.  Estimated longevity  is about 5 years.  There are 2 mode switches, the longest of which is 2-  1/2 minutes with upper rate of 175 per minute.   IMPRESSION:  1. Symptomatic bradycardia.  2. Hypertension.  3. Status post pacemaker insertion.   DISCUSSION:  Overall, Mr.  Jonathon [name] is stable, and his pacemaker is working  normally.  We will send to see the patient back in 1 year for pacemaker  followup.     Doylene Canning. Ladona Ridgel, MD  Electronically Signed    GWT/MedQ  DD: 11/03/2007  DT: 11/04/2007  Job #: [id number]

## 2010-09-25 NOTE — Assessment & Plan Note (Signed)
[facility]                         ELECTROPHYSIOLOGY OFFICE NOTE   NAME:Jonathon [name], Jonathon [name]                            MRN:          [id number]  DATE:12/02/2006                            DOB:          [birth year]    Jonathon [name] is referred today by Dr. Amador Cunas for ongoing treatment and  recommendations regarding his permanent pacemaker and cardiology follow-  up.  The patient is a very pleasant 75 year old male with a history of  hypertension and prostate hypertrophy, who underwent current pacemaker  insertion back in 2005.  At that time he had a Medtronic dual-chamber  device placed.  Since then he has done well.  He moved back to [place] approximately a year and a half ago and now comes in for  evaluation of his pacemaker.  The patient has had no syncope and he had  very minimal palpitations.  He denies chest pain or shortness of breath.   PAST MEDICAL HISTORY:  As noted in the HPI.   FAMILY HISTORY:  Noncontributory.   SOCIAL HISTORY:  The patient is divorced.  He drinks 4 alcoholic  beverages per day, sometimes more.  He denies tobacco use or  recreational drug use.   REVIEW OF SYSTEMS:  Negative for fevers, weight loss, anorexia, vision  or hearing problems except he does have some decreased hearing.  He  denies problems with hoarseness or chest pain or syncope, dyspnea.  He  denies peripheral edema.  He denies chronic cough or hemoptysis.  He  denies abdominal pain.  He denies transient visual difficulties.  The  rest of the review of systems was negative.   PHYSICAL EXAMINATION:  He is a pleasant, well-appearing middle-aged man  in no acute distress.  His blood pressure was 144/82, the pulse was 70 and regular, the  respirations 18, the weight was 194 pounds.  NECK:  No jugular venous distention.  There was no thyromegaly.  The  trachea is midline.  The carotids are 2+ and symmetric.  LUNGS:  Clear bilaterally to auscultation.  No  wheezes, rales or  rhonchi.  There was no increased work of breathing.  CARDIOVASCULAR:  A regular rate and rhythm with normal S1 and S2.  I did  not appreciate any murmurs, rubs or gallops.  PMI was not enlarged, nor  was it laterally displaced.  ABDOMEN:  Soft, nontender, nondistended.  There was no organomegaly.  The bowel sounds were present.  There was no rebound or guarding.  EXTREMITIES:  No cyanosis, clubbing or edema.  The pulses were 2+ and  symmetric.  NEUROLOGIC:  He was alert and oriented x3 with cranial nerves intact.  The strength was 5/5 and symmetric.   EKG demonstrates sinus rhythm with normal axis and intervals.   Interrogation of his pacemaker demonstrates a Medtronic with P waves  greater than 2 and R waves of 5, the impedance 549 in the atrium and 546  in the ventricle.  The threshold was 0.5 at 0.4 in both the atrium and  the ventricle.  The battery voltage is 2.79 V.  He  was 63% V-paced.   IMPRESSION:  1. Symptomatic bradycardia.  2. Status post pacemaker insertion.  3. Hypertension.   DISCUSSION:  Overall, Jonathon [name] is stable and his pacemaker appears to  be working normally.  We will plan to see him back for a pacemaker check  in one year.  I have asked that he follow up with Dr. Amador Cunas as  well on an as-needed basis.     Doylene Canning. Ladona Ridgel, MD  Electronically Signed    GWT/MedQ  DD: 12/02/2006  DT: 12/03/2006  Job #: [id number]   cc:   Gordy Savers, MD

## 2010-10-17 ENCOUNTER — Encounter: Payer: Self-pay | Admitting: Internal Medicine

## 2010-11-02 ENCOUNTER — Encounter: Payer: Self-pay | Admitting: Internal Medicine

## 2010-11-02 ENCOUNTER — Ambulatory Visit (INDEPENDENT_AMBULATORY_CARE_PROVIDER_SITE_OTHER): Payer: Private Health Insurance - Indemnity | Admitting: Internal Medicine

## 2010-11-02 VITALS — BP 110/80 | Temp 98.1°F | Wt 205.0 lb

## 2010-11-02 DIAGNOSIS — L723 Sebaceous cyst: Secondary | ICD-10-CM

## 2010-11-02 NOTE — Progress Notes (Signed)
  Subjective:    Patient ID: Jonathon [name], male    DOB: [birth year], 76 y.o.   MRN: [id number]  HPI  75 year old patient who presents complaining of an enlarging painful nodule involving the right lateral neck area. No fever or systemic complaints. He has a history of hypertension which has been stable.   Review of Systems  Skin: Positive for wound.       Objective:   Physical Exam  Constitutional: He appears well-developed and well-nourished. No distress.       Blood pressure 110/80  Skin:       A 1 cm slightly inflamed cyst was noted involving the right lateral neck area. The nodule is open and slightly draining. The sebaceous cyst was manually decompressed. Antibiotic ointment and bandage applied          Assessment & Plan:   Sebaceous cyst.  Mildly inflamed manually decompressed Hypertension stable. We'll continue present regimen

## 2010-11-02 NOTE — Patient Instructions (Signed)
Limit your sodium (Salt) intake  Please check your blood pressure on a regular basis.  If it is consistently greater than 150/90, please make an office appointment.  Call or return to clinic prn if these symptoms worsen or fail to improve as anticipated.  Return in 3 months for follow-up  

## 2010-11-20 ENCOUNTER — Ambulatory Visit (INDEPENDENT_AMBULATORY_CARE_PROVIDER_SITE_OTHER): Payer: Medicare Other | Admitting: Internal Medicine

## 2010-11-20 ENCOUNTER — Encounter: Payer: Self-pay | Admitting: Internal Medicine

## 2010-11-20 DIAGNOSIS — R001 Bradycardia, unspecified: Secondary | ICD-10-CM

## 2010-11-20 DIAGNOSIS — I10 Essential (primary) hypertension: Secondary | ICD-10-CM

## 2010-11-20 DIAGNOSIS — I498 Other specified cardiac arrhythmias: Secondary | ICD-10-CM

## 2010-11-20 DIAGNOSIS — Z950 Presence of cardiac pacemaker: Secondary | ICD-10-CM

## 2010-11-20 LAB — PACEMAKER DEVICE OBSERVATION
AL AMPLITUDE: 5.6 mv
AL THRESHOLD: 0.5 V
ATRIAL PACING PM: 5
BAMS-0001: 175 {beats}/min
RV LEAD IMPEDENCE PM: 590 Ohm
RV LEAD THRESHOLD: 0.5 V

## 2010-11-20 NOTE — Assessment & Plan Note (Signed)
His blood pressure is elevated today in the office but at home it is been well-controlled. Rather than increase his medical therapy, I've asked that he maintain a low-sodium diet. He admits to sodium indiscretion.

## 2010-11-20 NOTE — Assessment & Plan Note (Signed)
His device is working normally. Will recheck in several months. 

## 2010-11-20 NOTE — Patient Instructions (Signed)
Your physician wants you to follow-up in: 12 months Dr Court Joy will receive a reminder letter in the mail two months in advance. If you don't receive a letter, please call our office to schedule the follow-up appointment.  Remote monitoring is used to monitor your Pacemaker of ICD from home. This monitoring reduces the number of office visits required to check your device to one time per year. It allows [place] to keep an eye on the functioning of your device to ensure it is working properly. You are scheduled for a device check from home on 02/21/2011. You may send your transmission at any time that day. If you have a wireless device, the transmission will be sent automatically. After your physician reviews your transmission, you will receive a postcard with your next transmission date.

## 2010-11-20 NOTE — Progress Notes (Signed)
HPI Jonathon [name] returns today for followup. He is a pleasant 75 yo man with a h/o symptomatic bradycardia, s/p PPM, and HTN. He denies c/p, sob, or peripheral edema. He notes some mild shoulder pain which he experienced several months ago after working out too hard. No other complaints. His blood pressure is high today and he notes that at home it is well controlled and that he checks it daily. No Known Allergies   Current Outpatient Prescriptions  Medication Sig Dispense Refill  . diltiazem (CARDIZEM LA) 240 MG 24 hr tablet Take 1 tablet (240 mg total) by mouth daily.  90 tablet  6  . irbesartan (AVAPRO) 300 MG tablet Take 1 tablet (300 mg total) by mouth daily.  90 tablet  6  . Tamsulosin HCl (FLOMAX) 0.4 MG CAPS Take 1 capsule (0.4 mg total) by mouth daily.  90 capsule  6     Past Medical History  Diagnosis Date  . BENIGN PROSTATIC HYPERTROPHY, HX OF 10/25/2008  . BRADYCARDIA 10/25/2008  . COLONIC POLYPS, HX OF 11/21/2006  . HYPERTENSION 11/21/2006  . 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 PACEMAKER, PERMANENT 10/25/2008  . TOBACCO ABUSE 10/25/2008    ROS:   All systems reviewed and negative except as noted in the HPI.   Past Surgical History  Procedure Date  . Pacemaker insertion   . Kidney stone surgery   . Colonoscopy 4098,1191     Family History  Problem Relation Age of Onset  . Breast cancer Sister   . Diabetes type II Brother   . Breast cancer Sister   . Heart attack Father   . Kidney disease Father      History   Social History  . Marital Status: Divorced    Spouse Name: N/A    Number of Children: N/A  . Years of Education: N/A   Occupational History  . Not on file.   Social History Main Topics  . Smoking status: Former Smoker    Quit date: 06/15/2010  . Smokeless tobacco: Never Used  . Alcohol Use: Not on file  . Drug Use: Not on file  . Sexually Active: Not on file   Other Topics Concern  . Not on file   Social  History Narrative   Has relocated from [place] approximately 15 months ago. Daughter, history of uterine cancer. Current smoker 2 packs weekly.      BP 167/97  Pulse 75  Ht 5\' 11"  (1.803 m)  Wt 202 lb 6.4 oz (91.808 kg)  BMI 28.23 kg/m2  Physical Exam:  Well appearing NAD HEENT: Unremarkable Neck:  No JVD, no thyromegally Lymphatics:  No adenopathy Back:  No CVA tenderness Lungs:  Clear. Well-healed pacemaker incision. HEART:  Regular rate rhythm, no murmurs, no rubs, no clicks Abd:  Flat, positive bowel sounds, no organomegally, no rebound, no guarding Ext:  2 plus pulses, no edema, no cyanosis, no clubbing Skin:  No rashes no nodules Neuro:  CN II through XII intact, motor grossly intact  DEVICE  Normal device function.  See PaceArt for details.   Assess/Plan:

## 2011-01-11 ENCOUNTER — Encounter: Payer: Self-pay | Admitting: Internal Medicine

## 2011-01-11 ENCOUNTER — Ambulatory Visit (INDEPENDENT_AMBULATORY_CARE_PROVIDER_SITE_OTHER): Payer: Medicare Other | Admitting: Internal Medicine

## 2011-01-11 DIAGNOSIS — I10 Essential (primary) hypertension: Secondary | ICD-10-CM

## 2011-01-11 DIAGNOSIS — N400 Enlarged prostate without lower urinary tract symptoms: Secondary | ICD-10-CM

## 2011-01-11 DIAGNOSIS — Z950 Presence of cardiac pacemaker: Secondary | ICD-10-CM

## 2011-01-11 DIAGNOSIS — N200 Calculus of kidney: Secondary | ICD-10-CM

## 2011-01-11 DIAGNOSIS — Z87898 Personal history of other specified conditions: Secondary | ICD-10-CM

## 2011-01-11 DIAGNOSIS — Z860100 Personal history of colon polyps, unspecified: Secondary | ICD-10-CM

## 2011-01-11 DIAGNOSIS — Z0000 Encounter for general adult medical examination without abnormal findings: Secondary | ICD-10-CM

## 2011-01-11 LAB — CBC WITH DIFFERENTIAL/PLATELET
Basophils Absolute: 0 10*3/uL (ref 0.0–0.1)
Eosinophils Absolute: 0.1 10*3/uL (ref 0.0–0.7)
Lymphocytes Relative: 40.2 % (ref 12.0–46.0)
MCHC: 32.3 g/dL (ref 30.0–36.0)
Neutrophils Relative %: 46.4 % (ref 43.0–77.0)
Platelets: 179 10*3/uL (ref 150.0–400.0)
RDW: 17.9 % — ABNORMAL HIGH (ref 11.5–14.6)

## 2011-01-11 LAB — LIPID PANEL
Cholesterol: 217 mg/dL — ABNORMAL HIGH (ref 0–200)
Triglycerides: 72 mg/dL (ref 0.0–149.0)

## 2011-01-11 LAB — BASIC METABOLIC PANEL
CO2: 27 mEq/L (ref 19–32)
Calcium: 9.2 mg/dL (ref 8.4–10.5)
Creatinine, Ser: 1.2 mg/dL (ref 0.4–1.5)
Glucose, Bld: 84 mg/dL (ref 70–99)

## 2011-01-11 LAB — HEPATIC FUNCTION PANEL
ALT: 65 U/L — ABNORMAL HIGH (ref 0–53)
Total Bilirubin: 0.7 mg/dL (ref 0.3–1.2)
Total Protein: 7.6 g/dL (ref 6.0–8.3)

## 2011-01-11 LAB — LDL CHOLESTEROL, DIRECT: Direct LDL: 115.8 mg/dL

## 2011-01-11 MED ORDER — IRBESARTAN 300 MG PO TABS: 300.0000 mg | ORAL_TABLET | Freq: Every day | ORAL | Status: DC

## 2011-01-11 MED ORDER — TAMSULOSIN HCL 0.4 MG PO CAPS: 0.4000 mg | ORAL_CAPSULE | Freq: Every day | ORAL | Status: DC

## 2011-01-11 MED ORDER — DILTIAZEM HCL ER COATED BEADS 240 MG PO TB24: 240.0000 mg | ORAL_TABLET | Freq: Every day | ORAL | Status: DC

## 2011-01-11 NOTE — Patient Instructions (Signed)
Limit your sodium (Salt) intake    It is important that you exercise regularly, at least 20 minutes 3 to 4 times per week.  If you develop chest pain or shortness of breath seek  medical attention.  Please check your blood pressure on a regular basis.  If it is consistently greater than 150/90, please make an office appointment.  Return in one year for follow-up  

## 2011-01-11 NOTE — Progress Notes (Signed)
Subjective:    Patient ID: Jonathon [name], male    DOB: 07-01-1935, 75 y.o.   MRN: [id number]  HPI  History of Present Illness:   75 year-old patient seen today for a comprehensive evaluation. He has a history of treated hypertension. Is followed by cardiology, status post permanent pacemaker insertion. He has BPH, history of colonic polyps and also a history of nephrolithiasis. He has a history of  tobacco use but discontinued tobacco in February of 2012   Problems Prior to Update:   1) Nephrolithiasis (ICD-592.0)  2) Colonic Polyps (ICD-211.3)  3) Tobacco Abuse (ICD-305.1)  4) Benign Prostatic Hypertrophy, Hx of (ICD-V13.8)  5) Pacemaker, Permanent (ICD-V45.01)  6) Bradycardia (ICD-427.89)  7) Nephrolithiasis, Hx of (ICD-V13.01)  8) Colonic Polyps, Hx of (ICD-V12.72)  9) Benign Prostatic Hypertrophy (ICD-600.00)  10) Hypertension (ICD-401.9)   Medications Prior to Update:  1) Avapro 300 Mg Tabs (Irbesartan) .Marland Kitchen.. 1 Once Daily  2) Cardizem La 240 Mg Tb24 (Diltiazem Hcl Coated Beads) .Marland Kitchen.. 1 Once Daily  3) Flomax 0.4 Mg Cp24 (Tamsulosin Hcl) .Marland Kitchen.. 1 Once Daily   Allergies:  No Known Drug Allergies   Past History:  Past Medical History:  Reviewed history from 10/25/2008 and no changes required.  NEPHROLITHIASIS (ICD-592.0)  COLONIC POLYPS (ICD-211.3)  TOBACCO ABUSE (ICD-305.1)  BENIGN PROSTATIC HYPERTROPHY, HX OF (ICD-V13.8)  PACEMAKER, PERMANENT (ICD-V45.01)  BRADYCARDIA (ICD-427.89)  NEPHROLITHIASIS, HX OF (ICD-V13.01)  COLONIC POLYPS, HX OF (ICD-V12.72)  BENIGN PROSTATIC HYPERTROPHY (ICD-600.00)  HYPERTENSION (ICD-401.9)   Past Surgical History:  status post pacemaker insertion February 05.  Kidney stone extraction 1981  colonoscopy 2004,2009   Family History:  Reviewed history from 11/24/2007 and no changes required.  father died at 60 of an MI. history of. chronic kidney disease. Mother died 30. History Parkinson's disease  one brother history type 2 diabetes.    two sisters, history of breast cancer (both)   Social History:  Reviewed history from 11/07/2008 and no changes required.  has relocated from [place] approximately 15 months ago  daughter, history of uterine cancer  Former smoker   1. Risk factors, based on past  M,S,F history-  cardiovascular risk factors include a history of hypertension  2.  Physical activities: Remains quite active;  goes to the health club frequently; still works part-time  3.  Depression/mood: No history of depression or mood disorder  4.  Hearing: No deficit  5.  ADL's: Independent in all aspects of daily living  6.  Fall risk: Low  7.  Home safety: No problems identified  8.  Height weight, and visual acuity; height and weight stable no change in visual acuity  9.  Counseling: Heart healthy diet salt restriction regular exercise all encouraged  10. Lab orders based on risk factors: Laboratory profile will be reviewed  11. Referral: Followup cardiology as scheduled requires periodic monitoring of his permanent pacemaker  12. Care plan: Redo colonoscopy in 2 years  13. Cognitive assessment: Alert and oriented normal affect. No cognitive dysfunction.      Review of Systems  Constitutional: Negative for fever, chills, activity change, appetite change and fatigue.  HENT: Negative for hearing loss, ear pain, congestion, rhinorrhea, sneezing, mouth sores, trouble swallowing, neck pain, neck stiffness, dental problem, voice change, sinus pressure and tinnitus.   Eyes: Negative for photophobia, pain, redness and visual disturbance.  Respiratory: Negative for apnea, cough, choking, chest tightness, shortness of breath and wheezing.   Cardiovascular: Negative for chest pain, palpitations and leg swelling.  Gastrointestinal: Negative for nausea, vomiting, abdominal pain, diarrhea, constipation, blood in stool, abdominal distention, anal bleeding and rectal pain.  Genitourinary: Negative for dysuria,  urgency, frequency, hematuria, flank pain, decreased urine volume, discharge, penile swelling, scrotal swelling, difficulty urinating, genital sores and testicular pain.  Musculoskeletal: Negative for myalgias, back pain, joint swelling, arthralgias and gait problem.  Skin: Negative for color change, rash and wound.  Neurological: Negative for dizziness, tremors, seizures, syncope, facial asymmetry, speech difficulty, weakness, light-headedness, numbness and headaches.  Hematological: Negative for adenopathy. Does not bruise/bleed easily.  Psychiatric/Behavioral: Negative for suicidal ideas, hallucinations, behavioral problems, confusion, sleep disturbance, self-injury, dysphoric mood, decreased concentration and agitation. The patient is not nervous/anxious.        Objective:   Physical Exam  Constitutional: He appears well-developed and well-nourished.  HENT:  Head: Normocephalic and atraumatic.  Right Ear: External ear normal.  Left Ear: External ear normal.  Nose: Nose normal.  Mouth/Throat: Oropharynx is clear and moist.  Eyes: Conjunctivae and EOM are normal. Pupils are equal, round, and reactive to light. No scleral icterus.  Neck: Normal range of motion. Neck supple. No JVD present. No thyromegaly present.  Cardiovascular: Regular rhythm, normal heart sounds and intact distal pulses.  Exam reveals no gallop and no friction rub.   No murmur heard. Pulmonary/Chest: Effort normal and breath sounds normal. He exhibits no tenderness.  Abdominal: Soft. Bowel sounds are normal. He exhibits no distension and no mass. There is no tenderness.  Genitourinary: Prostate normal and penis normal.       Prostate +2 enlarged smooth and symmetrical  Redundant rectal hemorrhoidal tissue  Musculoskeletal: Normal range of motion. He exhibits no edema and no tenderness.  Lymphadenopathy:    He has no cervical adenopathy.  Neurological: He is alert. He has normal reflexes. No cranial nerve deficit.  Coordination normal.  Skin: Skin is warm and dry. No rash noted.  Psychiatric: He has a normal mood and affect. His behavior is normal.          Assessment & Plan:   Preventative health Hypertension Status post permanent pacemaker insertion History renal colic History colonic polyps BPH

## 2011-02-21 ENCOUNTER — Encounter: Payer: Medicare Other | Admitting: *Deleted

## 2011-02-25 ENCOUNTER — Encounter: Payer: Self-pay | Admitting: *Deleted

## 2011-03-01 ENCOUNTER — Ambulatory Visit (INDEPENDENT_AMBULATORY_CARE_PROVIDER_SITE_OTHER): Payer: Medicare Other | Admitting: *Deleted

## 2011-03-01 ENCOUNTER — Encounter: Payer: Self-pay | Admitting: Internal Medicine

## 2011-03-01 ENCOUNTER — Other Ambulatory Visit: Payer: Self-pay | Admitting: Internal Medicine

## 2011-03-01 DIAGNOSIS — Z950 Presence of cardiac pacemaker: Secondary | ICD-10-CM

## 2011-03-01 DIAGNOSIS — I498 Other specified cardiac arrhythmias: Secondary | ICD-10-CM

## 2011-03-10 LAB — REMOTE PACEMAKER DEVICE
AL AMPLITUDE: 2.8 mv
BAMS-0001: 175 {beats}/min
BATTERY VOLTAGE: 2.73 V
VENTRICULAR PACING PM: 18

## 2011-03-14 NOTE — Progress Notes (Signed)
Pacer remote check  

## 2011-03-23 ENCOUNTER — Ambulatory Visit (INDEPENDENT_AMBULATORY_CARE_PROVIDER_SITE_OTHER): Payer: Medicare Other | Admitting: Internal Medicine

## 2011-03-23 ENCOUNTER — Encounter: Payer: Self-pay | Admitting: Internal Medicine

## 2011-03-23 VITALS — BP 142/82 | HR 69 | Temp 97.6°F | Ht 70.0 in | Wt 198.4 lb

## 2011-03-23 DIAGNOSIS — K644 Residual hemorrhoidal skin tags: Secondary | ICD-10-CM

## 2011-03-23 MED ORDER — HYDROCORTISONE 2.5 % RE CREA: TOPICAL_CREAM | RECTAL | Status: DC

## 2011-03-23 NOTE — Assessment & Plan Note (Signed)
Not thrombosed Mild rectal prolapse Will try anusol [facility] Consider surgical eval if ongoing pain

## 2011-03-23 NOTE — Progress Notes (Signed)
  Subjective:    Patient ID: Jonathon [name], male    DOB: 10/06/1935, 75 y.o.   MRN: [id number]  HPI Having hemorrhoid problems Goes back for years but worse last week More swollen and painful OTC preparation H with slight relief  occ bleeding on toilet paper No blood in stool Last colonoscopy 3 years  Usually has 5-6 stools per day Increased gas last week though No abdominal pain  Current Outpatient Prescriptions on File Prior to Visit  Medication Sig Dispense Refill  . diltiazem (CARDIZEM LA) 240 MG 24 hr tablet Take 1 tablet (240 mg total) by mouth daily.  90 tablet  6  . irbesartan (AVAPRO) 300 MG tablet Take 1 tablet (300 mg total) by mouth daily.  90 tablet  6  . Tamsulosin HCl (FLOMAX) 0.4 MG CAPS Take 1 capsule (0.4 mg total) by mouth daily.  90 capsule  6    No Known Allergies  Past Medical History  Diagnosis Date  . BENIGN PROSTATIC HYPERTROPHY, HX OF 10/25/2008  . BRADYCARDIA 10/25/2008  . COLONIC POLYPS, HX OF 11/21/2006  . HYPERTENSION 11/21/2006  . 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 PACEMAKER, PERMANENT 10/25/2008  . TOBACCO ABUSE 10/25/2008    Past Surgical History  Procedure Date  . Pacemaker insertion   . Kidney stone surgery   . Colonoscopy [id number]    Family History  Problem Relation Age of Onset  . Breast cancer Sister   . Diabetes type II Brother   . Breast cancer Sister   . Heart attack Father   . Kidney disease Father     History   Social History  . Marital Status: Divorced    Spouse Name: N/A    Number of Children: N/A  . Years of Education: N/A   Occupational History  . Not on file.   Social History Main Topics  . Smoking status: Former Smoker    Quit date: 06/15/2010  . Smokeless tobacco: Never Used  . Alcohol Use: Not on file  . Drug Use: Not on file  . Sexually Active: Not on file   Other Topics Concern  . Not on file   Social History Narrative   Has relocated from [place] approximately 15 months  ago. Daughter, history of uterine cancer. Current smoker 2 packs weekly.    Review of Systems No fever Appetite is his usual     Objective:   Physical Exam  Constitutional: He appears well-developed and well-nourished. No distress.  Abdominal: Soft. There is no tenderness.  Genitourinary:       Slight rectal prolapse with circumferential hemorrhoids without sig tenderness Small internal extension of hemorrhoid as well No rectal mass          Assessment & Plan:

## 2011-04-05 ENCOUNTER — Encounter: Payer: Self-pay | Admitting: *Deleted

## 2011-05-13 ENCOUNTER — Encounter: Payer: Self-pay | Admitting: Internal Medicine

## 2011-05-13 ENCOUNTER — Other Ambulatory Visit: Payer: Self-pay | Admitting: Internal Medicine

## 2011-05-15 ENCOUNTER — Ambulatory Visit (INDEPENDENT_AMBULATORY_CARE_PROVIDER_SITE_OTHER): Payer: Medicare Other | Admitting: *Deleted

## 2011-05-15 DIAGNOSIS — I498 Other specified cardiac arrhythmias: Secondary | ICD-10-CM

## 2011-05-15 DIAGNOSIS — Z950 Presence of cardiac pacemaker: Secondary | ICD-10-CM

## 2011-05-15 LAB — REMOTE PACEMAKER DEVICE
AL AMPLITUDE: 2.8 mv
AL THRESHOLD: 0.625 V
ATRIAL PACING PM: 6
RV LEAD AMPLITUDE: 22.4 mv
RV LEAD IMPEDENCE PM: 648 Ohm
RV LEAD THRESHOLD: 0.625 V

## 2011-05-16 NOTE — Progress Notes (Signed)
Remote pacer check  

## 2011-05-29 ENCOUNTER — Encounter: Payer: Self-pay | Admitting: *Deleted

## 2011-06-13 ENCOUNTER — Encounter: Payer: Medicare Other | Admitting: *Deleted

## 2011-07-24 ENCOUNTER — Other Ambulatory Visit: Payer: Self-pay | Admitting: Internal Medicine

## 2011-07-25 MED ORDER — DILTIAZEM HCL ER COATED BEADS 240 MG PO TB24: 240.0000 mg | ORAL_TABLET | Freq: Every day | ORAL | Status: DC

## 2011-08-15 ENCOUNTER — Encounter: Payer: Medicare Other | Admitting: *Deleted

## 2011-08-22 ENCOUNTER — Encounter: Payer: Self-pay | Admitting: *Deleted

## 2011-09-02 ENCOUNTER — Encounter: Payer: Self-pay | Admitting: Internal Medicine

## 2011-09-02 ENCOUNTER — Ambulatory Visit (INDEPENDENT_AMBULATORY_CARE_PROVIDER_SITE_OTHER): Payer: Medicare Other | Admitting: *Deleted

## 2011-09-02 DIAGNOSIS — I498 Other specified cardiac arrhythmias: Secondary | ICD-10-CM

## 2011-09-02 LAB — PACEMAKER DEVICE OBSERVATION
BAMS-0001: 175 {beats}/min
BATTERY VOLTAGE: 2.71 V
RV LEAD AMPLITUDE: 8 mv
RV LEAD THRESHOLD: 0.375 V
VENTRICULAR PACING PM: 16

## 2011-09-02 NOTE — Progress Notes (Signed)
defib check in clinic  

## 2011-10-04 ENCOUNTER — Other Ambulatory Visit: Payer: Self-pay | Admitting: Internal Medicine

## 2011-10-29 DIAGNOSIS — M25559 Pain in unspecified hip: Secondary | ICD-10-CM | POA: Diagnosis not present

## 2011-10-29 DIAGNOSIS — Q762 Congenital spondylolisthesis: Secondary | ICD-10-CM | POA: Diagnosis not present

## 2011-10-29 DIAGNOSIS — M25569 Pain in unspecified knee: Secondary | ICD-10-CM | POA: Diagnosis not present

## 2011-11-04 DIAGNOSIS — M545 Low back pain: Secondary | ICD-10-CM | POA: Diagnosis not present

## 2011-11-04 DIAGNOSIS — M25569 Pain in unspecified knee: Secondary | ICD-10-CM | POA: Diagnosis not present

## 2011-11-04 DIAGNOSIS — M25559 Pain in unspecified hip: Secondary | ICD-10-CM | POA: Diagnosis not present

## 2011-11-11 ENCOUNTER — Ambulatory Visit (INDEPENDENT_AMBULATORY_CARE_PROVIDER_SITE_OTHER): Payer: Medicare Other | Admitting: Internal Medicine

## 2011-11-11 ENCOUNTER — Encounter: Payer: Self-pay | Admitting: Internal Medicine

## 2011-11-11 VITALS — BP 140/82 | HR 80 | Ht 71.0 in | Wt 189.0 lb

## 2011-11-11 DIAGNOSIS — I10 Essential (primary) hypertension: Secondary | ICD-10-CM

## 2011-11-11 DIAGNOSIS — Z950 Presence of cardiac pacemaker: Secondary | ICD-10-CM

## 2011-11-11 DIAGNOSIS — I498 Other specified cardiac arrhythmias: Secondary | ICD-10-CM

## 2011-11-11 DIAGNOSIS — M25559 Pain in unspecified hip: Secondary | ICD-10-CM | POA: Diagnosis not present

## 2011-11-11 DIAGNOSIS — M25569 Pain in unspecified knee: Secondary | ICD-10-CM | POA: Diagnosis not present

## 2011-11-11 LAB — PACEMAKER DEVICE OBSERVATION
AL AMPLITUDE: 4 mv
BAMS-0001: 175 {beats}/min
BATTERY VOLTAGE: 2.69 V
RV LEAD AMPLITUDE: 5.6 mv
RV LEAD IMPEDENCE PM: 523 Ohm

## 2011-11-11 NOTE — Progress Notes (Signed)
HPI [name] returns today for followup. He is a pleasant 76 yo man with a h/o HTN, symptomatic bradycardia s/p PPM. He denies chest pain or shortness of breath. No syncope. He remains active. No Known Allergies   Current Outpatient Prescriptions  Medication Sig Dispense Refill  . diltiazem (CARDIZEM LA) 240 MG 24 hr tablet Take 1 tablet (240 mg total) by mouth daily.  90 tablet  3  . irbesartan (AVAPRO) 300 MG tablet Take 1 tablet (300 mg total) by mouth daily.  90 tablet  6  . methocarbamol (ROBAXIN) 500 MG tablet as needed.       . Tamsulosin HCl (FLOMAX) 0.4 MG CAPS Take 1 capsule (0.4 mg total) by mouth daily.  90 capsule  6  . traMADol (ULTRAM) 50 MG tablet 50 mg every 8 (eight) hours as needed.       Marland Kitchen DISCONTD: irbesartan (AVAPRO) 300 MG tablet TAKE 1 TABLET BY MOUTH EVERY DAY  90 tablet  0     Past Medical History  Diagnosis Date  . BENIGN PROSTATIC HYPERTROPHY, HX OF 10/25/2008  . BRADYCARDIA 10/25/2008  . COLONIC POLYPS, HX OF 11/21/2006  . HYPERTENSION 11/21/2006  . 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 PACEMAKER, PERMANENT 10/25/2008  . TOBACCO ABUSE 10/25/2008    ROS:   All systems reviewed and negative except as noted in the HPI.   Past Surgical History  Procedure Date  . Pacemaker insertion   . Kidney stone surgery   . Colonoscopy 1610,9604     Family History  Problem Relation Age of Onset  . Breast cancer Sister   . Diabetes type II Brother   . Breast cancer Sister   . Heart attack Father   . Kidney disease Father      History   Social History  . Marital Status: Divorced    Spouse Name: N/A    Number of Children: N/A  . Years of Education: N/A   Occupational History  . Not on file.   Social History Main Topics  . Smoking status: Former Smoker    Quit date: 06/15/2010  . Smokeless tobacco: Never Used  . Alcohol Use: Not on file  . Drug Use: Not on file  . Sexually Active: Not on file   Other Topics Concern  .  Not on file   Social History Narrative   Has relocated from [place] approximately 15 months ago. Daughter, history of uterine cancer. Current smoker 2 packs weekly.      BP 140/82  Pulse 80  Ht 5\' 11"  (1.803 m)  Wt 189 lb (85.73 kg)  BMI 26.36 kg/m2  Physical Exam:  Well appearing 76 year old man, NAD HEENT: Unremarkable Neck:  No JVD, no thyromegally Lungs:  Clear with no wheezes, rales, or rhonchi. HEART:  Regular rate rhythm, no murmurs, no rubs, no clicks Abd:  soft, positive bowel sounds, no organomegally, no rebound, no guarding Ext:  2 plus pulses, no edema, no cyanosis, no clubbing Skin:  No rashes no nodules Neuro:  CN II through XII intact, motor grossly intact  DEVICE  Normal device function.  See PaceArt for details.   Assess/Plan:

## 2011-11-11 NOTE — Assessment & Plan Note (Signed)
His blood pressure is minimally elevated. I've asked him to reduce his salt intake, continue his current medical therapy, and reduce his alcohol consumption.

## 2011-11-11 NOTE — Assessment & Plan Note (Signed)
His device is working normally. We'll plan to recheck in several months. 

## 2011-11-11 NOTE — Patient Instructions (Signed)
Remember to send a transmission on 02/17/12  Your physician wants you to follow-up in: 1 Year with Ladona Ridgel. You will receive a reminder letter in the mail two months in advance. If you don't receive a letter, please call our office to schedule the follow-up appointment.

## 2011-11-13 DIAGNOSIS — M25569 Pain in unspecified knee: Secondary | ICD-10-CM | POA: Diagnosis not present

## 2011-11-13 DIAGNOSIS — M25559 Pain in unspecified hip: Secondary | ICD-10-CM | POA: Diagnosis not present

## 2011-11-18 DIAGNOSIS — M25559 Pain in unspecified hip: Secondary | ICD-10-CM | POA: Diagnosis not present

## 2011-11-20 DIAGNOSIS — M25559 Pain in unspecified hip: Secondary | ICD-10-CM | POA: Diagnosis not present

## 2011-11-20 DIAGNOSIS — M25569 Pain in unspecified knee: Secondary | ICD-10-CM | POA: Diagnosis not present

## 2011-11-21 DIAGNOSIS — M25559 Pain in unspecified hip: Secondary | ICD-10-CM | POA: Diagnosis not present

## 2011-11-25 DIAGNOSIS — M25559 Pain in unspecified hip: Secondary | ICD-10-CM | POA: Diagnosis not present

## 2011-11-25 DIAGNOSIS — M25569 Pain in unspecified knee: Secondary | ICD-10-CM | POA: Diagnosis not present

## 2011-11-27 DIAGNOSIS — M25569 Pain in unspecified knee: Secondary | ICD-10-CM | POA: Diagnosis not present

## 2011-11-27 DIAGNOSIS — M25559 Pain in unspecified hip: Secondary | ICD-10-CM | POA: Diagnosis not present

## 2011-12-03 DIAGNOSIS — M25559 Pain in unspecified hip: Secondary | ICD-10-CM | POA: Diagnosis not present

## 2011-12-03 DIAGNOSIS — M25569 Pain in unspecified knee: Secondary | ICD-10-CM | POA: Diagnosis not present

## 2011-12-05 DIAGNOSIS — M25569 Pain in unspecified knee: Secondary | ICD-10-CM | POA: Diagnosis not present

## 2011-12-05 DIAGNOSIS — M25559 Pain in unspecified hip: Secondary | ICD-10-CM | POA: Diagnosis not present

## 2011-12-10 DIAGNOSIS — M25559 Pain in unspecified hip: Secondary | ICD-10-CM | POA: Diagnosis not present

## 2011-12-10 DIAGNOSIS — M25569 Pain in unspecified knee: Secondary | ICD-10-CM | POA: Diagnosis not present

## 2011-12-12 DIAGNOSIS — M25569 Pain in unspecified knee: Secondary | ICD-10-CM | POA: Diagnosis not present

## 2011-12-12 DIAGNOSIS — M25559 Pain in unspecified hip: Secondary | ICD-10-CM | POA: Diagnosis not present

## 2011-12-17 DIAGNOSIS — M25559 Pain in unspecified hip: Secondary | ICD-10-CM | POA: Diagnosis not present

## 2011-12-17 DIAGNOSIS — M25569 Pain in unspecified knee: Secondary | ICD-10-CM | POA: Diagnosis not present

## 2011-12-19 DIAGNOSIS — M25559 Pain in unspecified hip: Secondary | ICD-10-CM | POA: Diagnosis not present

## 2011-12-26 DIAGNOSIS — M25569 Pain in unspecified knee: Secondary | ICD-10-CM | POA: Diagnosis not present

## 2012-01-12 ENCOUNTER — Other Ambulatory Visit: Payer: Self-pay | Admitting: Internal Medicine

## 2012-01-14 ENCOUNTER — Encounter: Payer: Self-pay | Admitting: Internal Medicine

## 2012-01-14 ENCOUNTER — Ambulatory Visit (INDEPENDENT_AMBULATORY_CARE_PROVIDER_SITE_OTHER): Payer: Medicare Other | Admitting: Internal Medicine

## 2012-01-14 VITALS — BP 122/80 | HR 74 | Temp 98.1°F | Resp 18 | Ht 70.0 in | Wt 190.0 lb

## 2012-01-14 DIAGNOSIS — Z87442 Personal history of urinary calculi: Secondary | ICD-10-CM

## 2012-01-14 DIAGNOSIS — I498 Other specified cardiac arrhythmias: Secondary | ICD-10-CM | POA: Diagnosis not present

## 2012-01-14 DIAGNOSIS — Z860100 Personal history of colon polyps, unspecified: Secondary | ICD-10-CM

## 2012-01-14 DIAGNOSIS — Z0000 Encounter for general adult medical examination without abnormal findings: Secondary | ICD-10-CM

## 2012-01-14 DIAGNOSIS — Z87898 Personal history of other specified conditions: Secondary | ICD-10-CM

## 2012-01-14 DIAGNOSIS — E785 Hyperlipidemia, unspecified: Secondary | ICD-10-CM | POA: Diagnosis not present

## 2012-01-14 DIAGNOSIS — K644 Residual hemorrhoidal skin tags: Secondary | ICD-10-CM

## 2012-01-14 DIAGNOSIS — I10 Essential (primary) hypertension: Secondary | ICD-10-CM

## 2012-01-14 DIAGNOSIS — Z950 Presence of cardiac pacemaker: Secondary | ICD-10-CM

## 2012-01-14 LAB — CBC WITH DIFFERENTIAL/PLATELET
Basophils Absolute: 0 10*3/uL (ref 0.0–0.1)
Eosinophils Relative: 0.9 % (ref 0.0–5.0)
HCT: 31.5 % — ABNORMAL LOW (ref 39.0–52.0)
Hemoglobin: 10.3 g/dL — ABNORMAL LOW (ref 13.0–17.0)
Lymphocytes Relative: 37.2 % (ref 12.0–46.0)
Monocytes Relative: 12.1 % — ABNORMAL HIGH (ref 3.0–12.0)
Neutro Abs: 1.8 10*3/uL (ref 1.4–7.7)
RBC: 3.81 Mil/uL — ABNORMAL LOW (ref 4.22–5.81)
RDW: 15 % — ABNORMAL HIGH (ref 11.5–14.6)
WBC: 3.6 10*3/uL — ABNORMAL LOW (ref 4.5–10.5)

## 2012-01-14 LAB — COMPREHENSIVE METABOLIC PANEL
ALT: 54 U/L — ABNORMAL HIGH (ref 0–53)
BUN: 15 mg/dL (ref 6–23)
CO2: 26 mEq/L (ref 19–32)
Calcium: 9.4 mg/dL (ref 8.4–10.5)
Chloride: 106 mEq/L (ref 96–112)
Creatinine, Ser: 1.1 mg/dL (ref 0.4–1.5)
GFR: 88.8 mL/min (ref >60.00)
Glucose, Bld: 88 mg/dL (ref 70–99)
Total Bilirubin: 0.8 mg/dL (ref 0.3–1.2)

## 2012-01-14 LAB — TSH: TSH: 0.85 u[IU]/mL (ref 0.35–5.50)

## 2012-01-14 LAB — LIPID PANEL
Cholesterol: 206 mg/dL — ABNORMAL HIGH (ref 0–200)
Triglycerides: 44 mg/dL (ref 0.0–149.0)

## 2012-01-14 MED ORDER — DILTIAZEM HCL ER COATED BEADS 240 MG PO TB24: 240.0000 mg | ORAL_TABLET | Freq: Every day | ORAL | Status: DC

## 2012-01-14 MED ORDER — TRAMADOL HCL 50 MG PO TABS: 50.0000 mg | ORAL_TABLET | Freq: Three times a day (TID) | ORAL | Status: DC | PRN

## 2012-01-14 MED ORDER — TAMSULOSIN HCL 0.4 MG PO CAPS: 0.4000 mg | ORAL_CAPSULE | Freq: Every day | ORAL | Status: DC

## 2012-01-14 MED ORDER — IRBESARTAN 300 MG PO TABS: 300.0000 mg | ORAL_TABLET | Freq: Every day | ORAL | Status: DC

## 2012-01-14 NOTE — Progress Notes (Signed)
Patient ID: Jonathon [name], male   DOB: 12-10-1935, 76 y.o.   MRN: [id number]  Subjective:    Patient ID: Jonathon [name], male    DOB: [birth year], 76 y.o.   MRN: [id number]  Hypertension Pertinent negatives include no chest pain, headaches, neck pain, palpitations or shortness of breath.    History of Present Illness:   76 year-old patient seen today for a comprehensive evaluation. He has a history of treated hypertension. Is followed by cardiology, status post permanent pacemaker insertion. He has BPH, history of colonic polyps and also a history of nephrolithiasis. He has a history of  tobacco use but discontinued tobacco in February of 2012;  Last colonoscopy 2009   Problems Prior to Update:   1) Nephrolithiasis (ICD-592.0)  2) Colonic Polyps (ICD-211.3)  3) Tobacco Abuse (ICD-305.1)  4) Benign Prostatic Hypertrophy, Hx of (ICD-V13.8)  5) Pacemaker, Permanent (ICD-V45.01)  6) Bradycardia (ICD-427.89)  7) Nephrolithiasis, Hx of (ICD-V13.01)  8) Colonic Polyps, Hx of (ICD-V12.72)  9) Benign Prostatic Hypertrophy (ICD-600.00)  10) Hypertension (ICD-401.9)   Medications Prior to Update:  1) Avapro 300 Mg Tabs (Irbesartan) .Marland Kitchen.. 1 Once Daily  2) Cardizem La 240 Mg Tb24 (Diltiazem Hcl Coated Beads) .Marland Kitchen.. 1 Once Daily  3) Flomax 0.4 Mg Cp24 (Tamsulosin Hcl) .Marland Kitchen.. 1 Once Daily   Allergies:  No Known Drug Allergies   Past History:  Past Medical History:  Reviewed history from 10/25/2008 and no changes required.  NEPHROLITHIASIS (ICD-592.0)  COLONIC POLYPS (ICD-211.3)  TOBACCO ABUSE (ICD-305.1)  BENIGN PROSTATIC HYPERTROPHY, HX OF (ICD-V13.8)  PACEMAKER, PERMANENT (ICD-V45.01)  BRADYCARDIA (ICD-427.89)  NEPHROLITHIASIS, HX OF (ICD-V13.01)  COLONIC POLYPS, HX OF (ICD-V12.72)  BENIGN PROSTATIC HYPERTROPHY (ICD-600.00)  HYPERTENSION (ICD-401.9)   Past Surgical History:  status post pacemaker insertion February 05.  Kidney stone extraction 1981  colonoscopy 2004,2009   Family History:    Reviewed history from 11/24/2007 and no changes required.  father died at 72 of an MI. history of. chronic kidney disease. Mother died 8. History Parkinson's disease  one brother history type 2 diabetes.  two sisters, history of breast cancer (both)   Social History:  Reviewed history from 11/07/2008 and no changes required.  has relocated from [place] approximately 15 months ago  daughter, history of uterine cancer  Former smoker   1. Risk factors, based on past  M,S,F history-  cardiovascular risk factors include a history of hypertension  2.  Physical activities: Remains quite active;  goes to the health club frequently; still works part-time  3.  Depression/mood: No history of depression or mood disorder  4.  Hearing: No deficit  5.  ADL's: Independent in all aspects of daily living  6.  Fall risk: Low  7.  Home safety: No problems identified  8.  Height weight, and visual acuity; height and weight stable no change in visual acuity  9.  Counseling: Heart healthy diet salt restriction regular exercise all encouraged  10. Lab orders based on risk factors: Laboratory profile will be reviewed  11. Referral: Followup cardiology as scheduled requires periodic monitoring of his permanent pacemaker  12. Care plan: Redo colonoscopy in 1  years  13. Cognitive assessment: Alert and oriented normal affect. No cognitive dysfunction.  Past Medical History  Diagnosis Date  . BENIGN PROSTATIC HYPERTROPHY, HX OF 10/25/2008  . BRADYCARDIA 10/25/2008  . COLONIC POLYPS, HX OF 11/21/2006  . HYPERTENSION 11/21/2006  . 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 PACEMAKER, PERMANENT 10/25/2008  .  TOBACCO ABUSE 10/25/2008    History   Social History  . Marital Status: Divorced    Spouse Name: N/A    Number of Children: N/A  . Years of Education: N/A   Occupational History  . Not on file.   Social History Main Topics  . Smoking status: Former Smoker     Quit date: 06/15/2010  . Smokeless tobacco: Never Used  . Alcohol Use: Not on file  . Drug Use: Not on file  . Sexually Active: Not on file   Other Topics Concern  . Not on file   Social History Narrative   Has relocated from [place] approximately 15 months ago. Daughter, history of uterine cancer. Current smoker 2 packs weekly.     Past Surgical History  Procedure Date  . Pacemaker insertion   . Kidney stone surgery   . Colonoscopy [id number]    Family History  Problem Relation Age of Onset  . Breast cancer Sister   . Diabetes type II Brother   . Breast cancer Sister   . Heart attack Father   . Kidney disease Father     No Known Allergies  Current Outpatient Prescriptions on File Prior to Visit  Medication Sig Dispense Refill  . diltiazem (CARDIZEM LA) 240 MG 24 hr tablet Take 1 tablet (240 mg total) by mouth daily.  90 tablet  3  . irbesartan (AVAPRO) 300 MG tablet Take 1 tablet (300 mg total) by mouth daily.  90 tablet  6  . methocarbamol (ROBAXIN) 500 MG tablet as needed.       . Tamsulosin HCl (FLOMAX) 0.4 MG CAPS Take 1 capsule (0.4 mg total) by mouth daily.  90 capsule  6  . traMADol (ULTRAM) 50 MG tablet 50 mg every 8 (eight) hours as needed.       Marland Kitchen DISCONTD: irbesartan (AVAPRO) 300 MG tablet TAKE 1 TABLET BY MOUTH EVERY DAY  90 tablet  0    BP 122/80  Pulse 74  Temp 98.1 F (36.7 C) (Oral)  Resp 18  Ht 5\' 10"  (1.778 m)  Wt 190 lb (86.183 kg)  BMI 27.26 kg/m2  SpO2 97%       Review of Systems  Constitutional: Negative for fever, chills, activity change, appetite change and fatigue.  HENT: Negative for hearing loss, ear pain, congestion, rhinorrhea, sneezing, mouth sores, trouble swallowing, neck pain, neck stiffness, dental problem, voice change, sinus pressure and tinnitus.   Eyes: Negative for photophobia, pain, redness and visual disturbance.  Respiratory: Negative for apnea, cough, choking, chest tightness, shortness of breath and wheezing.     Cardiovascular: Negative for chest pain, palpitations and leg swelling.  Gastrointestinal: Negative for nausea, vomiting, abdominal pain, diarrhea, constipation, blood in stool, abdominal distention, anal bleeding and rectal pain.  Genitourinary: Negative for dysuria, urgency, frequency, hematuria, flank pain, decreased urine volume, discharge, penile swelling, scrotal swelling, difficulty urinating, genital sores and testicular pain.  Musculoskeletal: Negative for myalgias, back pain, joint swelling, arthralgias and gait problem.  Skin: Negative for color change, rash and wound.  Neurological: Negative for dizziness, tremors, seizures, syncope, facial asymmetry, speech difficulty, weakness, light-headedness, numbness and headaches.  Hematological: Negative for adenopathy. Does not bruise/bleed easily.  Psychiatric/Behavioral: Negative for suicidal ideas, hallucinations, behavioral problems, confusion, disturbed wake/sleep cycle, self-injury, dysphoric mood, decreased concentration and agitation. The patient is not nervous/anxious.        Objective:   Physical Exam  Constitutional: He appears well-developed and well-nourished.  HENT:  Head: Normocephalic and atraumatic.  Right Ear: External ear normal.  Left Ear: External ear normal.  Nose: Nose normal.  Mouth/Throat: Oropharynx is clear and moist.  Eyes: Conjunctivae and EOM are normal. Pupils are equal, round, and reactive to light. No scleral icterus.  Neck: Normal range of motion. Neck supple. No JVD present. No thyromegaly present.  Cardiovascular: Regular rhythm, normal heart sounds and intact distal pulses.  Exam reveals no gallop and no friction rub.   No murmur heard. Pulmonary/Chest: Effort normal and breath sounds normal. He exhibits no tenderness.  Abdominal: Soft. Bowel sounds are normal. He exhibits no distension and no mass. There is no tenderness.  Genitourinary: Prostate normal and penis normal.       Prostate +2  enlarged smooth and symmetrical  Redundant rectal hemorrhoidal tissue  Musculoskeletal: Normal range of motion. He exhibits no edema and no tenderness.  Lymphadenopathy:    He has no cervical adenopathy.  Neurological: He is alert. He has normal reflexes. No cranial nerve deficit. Coordination normal.  Skin: Skin is warm and dry. No rash noted.  Psychiatric: He has a normal mood and affect. His behavior is normal.          Assessment & Plan:   Preventative health Hypertension Status post permanent pacemaker insertion History renal colic History colonic polyps BPH

## 2012-01-14 NOTE — Patient Instructions (Signed)
Limit your sodium (Salt) intake  Please check your blood pressure on a regular basis.  If it is consistently greater than 150/90, please make an office appointment.  Return in one year for follow-up  

## 2012-02-08 ENCOUNTER — Other Ambulatory Visit: Payer: Self-pay | Admitting: Internal Medicine

## 2012-02-17 ENCOUNTER — Ambulatory Visit (INDEPENDENT_AMBULATORY_CARE_PROVIDER_SITE_OTHER): Payer: Medicare Other | Admitting: *Deleted

## 2012-02-17 DIAGNOSIS — I498 Other specified cardiac arrhythmias: Secondary | ICD-10-CM | POA: Diagnosis not present

## 2012-02-17 DIAGNOSIS — Z950 Presence of cardiac pacemaker: Secondary | ICD-10-CM | POA: Diagnosis not present

## 2012-02-18 ENCOUNTER — Encounter: Payer: Self-pay | Admitting: Internal Medicine

## 2012-02-20 LAB — REMOTE PACEMAKER DEVICE
AL AMPLITUDE: 2.8 mv
AL THRESHOLD: 0.75 V
ATRIAL PACING PM: 5
BAMS-0001: 175 {beats}/min
RV LEAD IMPEDENCE PM: 563 Ohm
RV LEAD THRESHOLD: 0.625 V

## 2012-02-27 ENCOUNTER — Encounter: Payer: Self-pay | Admitting: *Deleted

## 2012-05-20 DIAGNOSIS — S93409A Sprain of unspecified ligament of unspecified ankle, initial encounter: Secondary | ICD-10-CM | POA: Diagnosis not present

## 2012-05-20 DIAGNOSIS — M779 Enthesopathy, unspecified: Secondary | ICD-10-CM | POA: Diagnosis not present

## 2012-05-20 DIAGNOSIS — M19079 Primary osteoarthritis, unspecified ankle and foot: Secondary | ICD-10-CM | POA: Diagnosis not present

## 2012-05-20 DIAGNOSIS — M79609 Pain in unspecified limb: Secondary | ICD-10-CM | POA: Diagnosis not present

## 2012-05-25 ENCOUNTER — Telehealth: Payer: Self-pay | Admitting: *Deleted

## 2012-05-25 ENCOUNTER — Ambulatory Visit (INDEPENDENT_AMBULATORY_CARE_PROVIDER_SITE_OTHER): Payer: Medicare Other | Admitting: *Deleted

## 2012-05-25 DIAGNOSIS — I498 Other specified cardiac arrhythmias: Secondary | ICD-10-CM | POA: Diagnosis not present

## 2012-05-25 DIAGNOSIS — Z950 Presence of cardiac pacemaker: Secondary | ICD-10-CM

## 2012-05-25 LAB — REMOTE PACEMAKER DEVICE
RV LEAD IMPEDENCE PM: 584 Ohm
VENTRICULAR PACING PM: 12

## 2012-05-25 NOTE — Telephone Encounter (Signed)
[employer] pacer remote received.  Device at [facility] since 04-06-2012.  Left message for patient to call to schedule appt with Dr Ladona Ridgel to discuss pacer gen change.  Pt should be seen in next 2 weeks.  Asked pt to speak with Melissa since I will be in the hospital the rest of the week

## 2012-05-27 ENCOUNTER — Encounter: Payer: Self-pay | Admitting: *Deleted

## 2012-05-31 ENCOUNTER — Emergency Department (INDEPENDENT_AMBULATORY_CARE_PROVIDER_SITE_OTHER)
Admission: EM | Admit: 2012-05-31 | Discharge: 2012-05-31 | Disposition: A | Discharge status: Home / Self Care | Payer: Medicare Other | Source: Home / Self Care | Attending: Emergency Medicine | Admitting: Emergency Medicine

## 2012-05-31 ENCOUNTER — Encounter (HOSPITAL_COMMUNITY): Payer: Self-pay

## 2012-05-31 DIAGNOSIS — J069 Acute upper respiratory infection, unspecified: Secondary | ICD-10-CM

## 2012-05-31 DIAGNOSIS — H6590 Unspecified nonsuppurative otitis media, unspecified ear: Secondary | ICD-10-CM

## 2012-05-31 MED ORDER — AZITHROMYCIN 250 MG PO TABS: ORAL_TABLET | ORAL | Status: DC

## 2012-05-31 MED ORDER — LORATADINE 10 MG PO TABS: 10.0000 mg | ORAL_TABLET | Freq: Every day | ORAL | Status: DC

## 2012-05-31 NOTE — ED Provider Notes (Signed)
History     CSN: [id number]  Arrival date & time 05/31/12  1320   First MD Initiated Contact with Patient 05/31/12 1342      Chief Complaint  Patient presents with  . Cough    (Consider location/radiation/quality/duration/timing/severity/associated sxs/prior treatment) Patient is a 77 y.o. male presenting with cough. The history is provided by the patient.  Cough This is a new problem. The current episode started more than 1 week ago. The problem has not changed since onset.The cough is productive of sputum (yellow brown phlegm). There has been no fever. Associated symptoms include chills, rhinorrhea and sore throat. Pertinent negatives include no chest pain, no ear congestion, no ear pain, no headaches, no shortness of breath and no wheezing. He has tried decongestants (robitussin) for the symptoms. The treatment provided no relief. He is not a smoker (former). His past medical history is significant for bronchitis.  symptoms preceded by runny nose and sinus congestion.  Past Medical History  Diagnosis Date  . BENIGN PROSTATIC HYPERTROPHY, HX OF 10/25/2008  . BRADYCARDIA 10/25/2008  . COLONIC POLYPS, HX OF 11/21/2006  . HYPERTENSION 11/21/2006  . 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 PACEMAKER, PERMANENT 10/25/2008  . TOBACCO ABUSE 10/25/2008    Past Surgical History  Procedure Date  . Pacemaker insertion   . Kidney stone surgery   . Colonoscopy [id number]    Family History  Problem Relation Age of Onset  . Breast cancer Sister   . Diabetes type II Brother   . Breast cancer Sister   . Heart attack Father   . Kidney disease Father     History  Substance Use Topics  . Smoking status: Former Smoker    Quit date: 06/15/2010  . Smokeless tobacco: Never Used  . Alcohol Use: Not on file      Review of Systems  Constitutional: Positive for chills.  HENT: Positive for sore throat and rhinorrhea. Negative for ear pain.   Respiratory: Positive  for cough. Negative for shortness of breath and wheezing.   Cardiovascular: Negative for chest pain.  Neurological: Negative for headaches.  All other systems reviewed and are negative.    Allergies  Review of patient's allergies indicates no known allergies.  Home Medications   Current Outpatient Rx  Name  Route  Sig  Dispense  Refill  . IRBESARTAN 300 MG PO TABS   Oral   Take 1 tablet (300 mg total) by mouth daily.   90 tablet   6   . MELOXICAM 15 MG PO TABS   Oral   Take 15 mg by mouth daily.         Marland Kitchen TAMSULOSIN HCL 0.4 MG PO CAPS      TAKE 1 CAPSULE BY MOUTH EVERY DAY   90 capsule   3   . AZITHROMYCIN 250 MG PO TABS      Azithromycin 500mg  on day 1, then 250mg  on days 2-4   6 tablet   0   . DILTIAZEM HCL ER COATED BEADS 240 MG PO TB24   Oral   Take 1 tablet (240 mg total) by mouth daily.   90 tablet   3   . LORATADINE 10 MG PO TABS   Oral   Take 1 tablet (10 mg total) by mouth daily.   30 tablet   0   . METHOCARBAMOL 500 MG PO TABS      as needed.          Marland Kitchen  TAMSULOSIN HCL 0.4 MG PO CAPS   Oral   Take 1 capsule (0.4 mg total) by mouth daily.   90 capsule   6   . TRAMADOL HCL 50 MG PO TABS   Oral   Take 1 tablet (50 mg total) by mouth every 8 (eight) hours as needed.   60 tablet   3     BP 156/92  Pulse 88  Temp 98.9 F (37.2 C) (Oral)  Resp 14  SpO2 96%  Physical Exam  Nursing note and vitals reviewed. Constitutional: He is oriented to person, place, and time. Vital signs are normal. He appears well-developed and well-nourished. He is active and cooperative.  HENT:  Head: Normocephalic.  Right Ear: Tympanic membrane and external ear normal.  Left Ear: External ear normal. A middle ear effusion is present.  Nose: Nose normal. Right sinus exhibits no maxillary sinus tenderness and no frontal sinus tenderness. Left sinus exhibits no maxillary sinus tenderness and no frontal sinus tenderness.  Mouth/Throat: Uvula is midline,  oropharynx is clear and moist and mucous membranes are normal.  Eyes: Conjunctivae normal are normal. Pupils are equal, round, and reactive to light. No scleral icterus.  Neck: Trachea normal, normal range of motion and full passive range of motion without pain. Neck supple. No spinous process tenderness and no muscular tenderness present.  Cardiovascular: Normal rate, regular rhythm, S1 normal, normal heart sounds, intact distal pulses and normal pulses.   Pulmonary/Chest: Effort normal and breath sounds normal.  Lymphadenopathy:       Head (right side): No submental, no submandibular, no tonsillar, no preauricular, no posterior auricular and no occipital adenopathy present.       Head (left side): No submental, no submandibular, no tonsillar, no preauricular, no posterior auricular and no occipital adenopathy present.    He has no cervical adenopathy.       Right: No supraclavicular adenopathy present.       Left: No supraclavicular adenopathy present.  Neurological: He is alert and oriented to person, place, and time. No cranial nerve deficit or sensory deficit.  Skin: Skin is warm and dry. No rash noted.  Psychiatric: He has a normal mood and affect. His speech is normal and behavior is normal. Judgment and thought content normal. Cognition and memory are normal.    ED Course  Procedures (including critical care time)  Labs Reviewed - No data to display No results found.   1. URI (upper respiratory infection)   2. Middle ear effusion       MDM  Increase fluids, antihistamine of your choice without decongestant.  Rx provided for antibiotics if symptoms are not improved in 3 days.         Johnsie Kindred, NP 05/31/12 1650

## 2012-05-31 NOTE — ED Notes (Signed)
Cough, congestion, worse at night for 1 week; NAD at present

## 2012-06-01 NOTE — ED Provider Notes (Signed)
Medical screening examination/treatment/procedure(s) were performed by non-physician practitioner and as supervising physician I was immediately available for consultation/collaboration.  Leslee Home, M.D.   Reuben Likes, MD 06/01/12 [phone]

## 2012-06-10 ENCOUNTER — Encounter: Payer: Self-pay | Admitting: *Deleted

## 2012-06-10 ENCOUNTER — Ambulatory Visit (INDEPENDENT_AMBULATORY_CARE_PROVIDER_SITE_OTHER): Payer: Medicare Other | Admitting: Internal Medicine

## 2012-06-10 ENCOUNTER — Encounter: Payer: Self-pay | Admitting: Internal Medicine

## 2012-06-10 VITALS — BP 139/72 | HR 87 | Ht 71.0 in | Wt 186.6 lb

## 2012-06-10 DIAGNOSIS — Z950 Presence of cardiac pacemaker: Secondary | ICD-10-CM | POA: Diagnosis not present

## 2012-06-10 DIAGNOSIS — I10 Essential (primary) hypertension: Secondary | ICD-10-CM | POA: Diagnosis not present

## 2012-06-10 LAB — CBC WITH DIFFERENTIAL/PLATELET
Basophils Relative: 0 % (ref 0.0–3.0)
Eosinophils Absolute: 0 10*3/uL (ref 0.0–0.7)
MCHC: 32 g/dL (ref 30.0–36.0)
MCV: 77.8 fl — ABNORMAL LOW (ref 78.0–100.0)
Monocytes Absolute: 0.5 10*3/uL (ref 0.1–1.0)
Neutrophils Relative %: 63.4 % (ref 43.0–77.0)
Platelets: 276 10*3/uL (ref 150.0–400.0)

## 2012-06-10 LAB — BASIC METABOLIC PANEL
BUN: 47 mg/dL — ABNORMAL HIGH (ref 6–23)
CO2: 26 mEq/L (ref 19–32)
Chloride: 100 mEq/L (ref 96–112)
Creatinine, Ser: 3.1 mg/dL — ABNORMAL HIGH (ref 0.4–1.5)

## 2012-06-10 NOTE — Assessment & Plan Note (Signed)
His Medtronic dual-chamber pacemaker has reached elective replacement. We'll schedule pacemaker removal and insertion of a new device.

## 2012-06-10 NOTE — Patient Instructions (Addendum)
See instruction sheet for generator change  

## 2012-06-10 NOTE — Assessment & Plan Note (Signed)
His blood pressure is well controlled. He will continue his current medical therapy. He is instructed to maintain a low-sodium diet.

## 2012-06-10 NOTE — Progress Notes (Signed)
HPI Jonathon [name] returns today for followup. He is a very pleasant 77-year-old man with symptomatic bradycardia, status post permanent pacemaker insertion, hypertension, and tobacco abuse. His permanent pacemaker has reached elective replacement. He notes that he has had a cold over the last few weeks but no high fever or chills. No syncope. He continues to work part time. No Known Allergies   Current Outpatient Prescriptions  Medication Sig Dispense Refill  . azithromycin (ZITHROMAX Z-PAK) 250 MG tablet Azithromycin 500mg on day 1, then 250mg on days 2-4  6 tablet  0  . diltiazem (CARDIZEM LA) 240 MG 24 hr tablet Take 1 tablet (240 mg total) by mouth daily.  90 tablet  3  . irbesartan (AVAPRO) 300 MG tablet Take 1 tablet (300 mg total) by mouth daily.  90 tablet  6  . loratadine (CLARITIN) 10 MG tablet Take 1 tablet (10 mg total) by mouth daily.  30 tablet  0  . meloxicam (MOBIC) 15 MG tablet Take 15 mg by mouth daily.      . methocarbamol (ROBAXIN) 500 MG tablet as needed.       . Tamsulosin HCl (FLOMAX) 0.4 MG CAPS Take 1 capsule (0.4 mg total) by mouth daily.  90 capsule  6  . traMADol (ULTRAM) 50 MG tablet Take 1 tablet (50 mg total) by mouth every 8 (eight) hours as needed.  60 tablet  3     Past Medical History  Diagnosis Date  . BENIGN PROSTATIC HYPERTROPHY, HX OF 10/25/2008  . BRADYCARDIA 10/25/2008  . COLONIC POLYPS, HX OF 11/21/2006  . HYPERTENSION 11/21/2006  . 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 PACEMAKER, PERMANENT 10/25/2008  . TOBACCO ABUSE 10/25/2008    ROS:   All systems reviewed and negative except as noted in the HPI.   Past Surgical History  Procedure Date  . Pacemaker insertion   . Kidney stone surgery   . Colonoscopy 2004,2009     Family History  Problem Relation Age of Onset  . Breast cancer Sister   . Diabetes type II Brother   . Breast cancer Sister   . Heart attack Father   . Kidney disease Father      History    Social History  . Marital Status: Divorced    Spouse Name: N/A    Number of Children: N/A  . Years of Education: N/A   Occupational History  . Not on file.   Social History Main Topics  . Smoking status: Former Smoker    Quit date: 06/15/2010  . Smokeless tobacco: Never Used  . Alcohol Use: Not on file  . Drug Use: Not on file  . Sexually Active: Not on file   Other Topics Concern  . Not on file   Social History Narrative   Has relocated from [place] approximately 15 months ago. Daughter, history of uterine cancer. Current smoker 2 packs weekly.      BP 139/72  Pulse 87  Ht 5' 11" (1.803 m)  Wt 186 lb 9.6 oz (84.641 kg)  BMI 26.03 kg/m2  Physical Exam:  Well appearing 77-year-old man, NAD HEENT: Unremarkable Neck:  7 cm JVD, no thyromegally Lungs:  Clear with no wheezes, rales, or rhonchi HEART:  Regular rate rhythm, no murmurs, no rubs, no clicks Abd:  soft, positive bowel sounds, no organomegally, no rebound, no guarding Ext:  2 plus pulses, no edema, no cyanosis, no clubbing Skin:  No rashes no nodules Neuro:  CN II through   XII intact, motor grossly intact  DEVICE  Normal device function.  See PaceArt for details. Device at [facility]  Assess/Plan:  

## 2012-06-11 ENCOUNTER — Other Ambulatory Visit: Payer: Self-pay | Admitting: *Deleted

## 2012-06-11 DIAGNOSIS — Z45018 Encounter for adjustment and management of other part of cardiac pacemaker: Secondary | ICD-10-CM

## 2012-06-11 DIAGNOSIS — S93409A Sprain of unspecified ligament of unspecified ankle, initial encounter: Secondary | ICD-10-CM | POA: Diagnosis not present

## 2012-06-11 DIAGNOSIS — R001 Bradycardia, unspecified: Secondary | ICD-10-CM

## 2012-06-11 DIAGNOSIS — M779 Enthesopathy, unspecified: Secondary | ICD-10-CM | POA: Diagnosis not present

## 2012-06-17 ENCOUNTER — Encounter (HOSPITAL_COMMUNITY): Payer: Self-pay | Admitting: Pharmacy Technician

## 2012-06-17 ENCOUNTER — Encounter: Payer: Self-pay | Admitting: Internal Medicine

## 2012-06-18 DIAGNOSIS — I498 Other specified cardiac arrhythmias: Secondary | ICD-10-CM | POA: Diagnosis not present

## 2012-06-18 DIAGNOSIS — Z45018 Encounter for adjustment and management of other part of cardiac pacemaker: Secondary | ICD-10-CM | POA: Diagnosis not present

## 2012-06-18 DIAGNOSIS — F17200 Nicotine dependence, unspecified, uncomplicated: Secondary | ICD-10-CM | POA: Diagnosis not present

## 2012-06-18 DIAGNOSIS — I10 Essential (primary) hypertension: Secondary | ICD-10-CM | POA: Diagnosis not present

## 2012-06-18 MED ORDER — CEFAZOLIN SODIUM-DEXTROSE 2-3 GM-% IV SOLR
2.0000 g | INTRAVENOUS | Status: AC
  Filled 2012-06-18 (×3): qty 50

## 2012-06-18 MED ORDER — SODIUM CHLORIDE 0.9 % IR SOLN
80.0000 mg | Status: AC
  Filled 2012-06-18: qty 2

## 2012-06-19 ENCOUNTER — Ambulatory Visit (HOSPITAL_COMMUNITY): Payer: Medicare Other

## 2012-06-19 ENCOUNTER — Encounter (HOSPITAL_COMMUNITY)
Admission: RE | Disposition: A | Discharge status: Home / Self Care | Payer: Self-pay | Source: Ambulatory Visit | Attending: Internal Medicine

## 2012-06-19 ENCOUNTER — Ambulatory Visit (HOSPITAL_COMMUNITY)
Admission: RE | Admit: 2012-06-19 | Discharge: 2012-06-19 | Disposition: A | Discharge status: Home / Self Care | Payer: Medicare Other | Source: Ambulatory Visit | Attending: Internal Medicine | Admitting: Internal Medicine

## 2012-06-19 DIAGNOSIS — Z45018 Encounter for adjustment and management of other part of cardiac pacemaker: Secondary | ICD-10-CM

## 2012-06-19 DIAGNOSIS — F17200 Nicotine dependence, unspecified, uncomplicated: Secondary | ICD-10-CM | POA: Diagnosis not present

## 2012-06-19 DIAGNOSIS — I77819 Aortic ectasia, unspecified site: Secondary | ICD-10-CM | POA: Diagnosis not present

## 2012-06-19 DIAGNOSIS — Z01818 Encounter for other preprocedural examination: Secondary | ICD-10-CM | POA: Diagnosis not present

## 2012-06-19 DIAGNOSIS — I498 Other specified cardiac arrhythmias: Secondary | ICD-10-CM | POA: Insufficient documentation

## 2012-06-19 DIAGNOSIS — I10 Essential (primary) hypertension: Secondary | ICD-10-CM | POA: Insufficient documentation

## 2012-06-19 DIAGNOSIS — R001 Bradycardia, unspecified: Secondary | ICD-10-CM

## 2012-06-19 HISTORY — PX: PERMANENT PACEMAKER GENERATOR CHANGE: SHX6022

## 2012-06-19 LAB — BASIC METABOLIC PANEL
BUN: 19 mg/dL (ref 6–23)
Calcium: 9.1 mg/dL (ref 8.4–10.5)
GFR calc Af Amer: 78 mL/min — ABNORMAL LOW (ref >90)
GFR calc non Af Amer: 67 mL/min — ABNORMAL LOW (ref >90)
Potassium: 4.4 mEq/L (ref 3.5–5.1)

## 2012-06-19 SURGERY — PERMANENT PACEMAKER GENERATOR CHANGE
Anesthesia: LOCAL

## 2012-06-19 MED ORDER — MUPIROCIN 2 % EX OINT
TOPICAL_OINTMENT | Freq: Two times a day (BID) | CUTANEOUS | Status: DC
  Administered 2012-06-19: 12:00:00 via NASAL
  Filled 2012-06-19: qty 22

## 2012-06-19 MED ORDER — FENTANYL CITRATE 0.05 MG/ML IJ SOLN
INTRAMUSCULAR | Status: AC
  Filled 2012-06-19: qty 2

## 2012-06-19 MED ORDER — ONDANSETRON HCL 4 MG/2ML IJ SOLN: 4.0000 mg | Freq: Four times a day (QID) | INTRAMUSCULAR | Status: DC | PRN

## 2012-06-19 MED ORDER — SODIUM CHLORIDE 0.45 % IV SOLN: INTRAVENOUS | Status: DC

## 2012-06-19 MED ORDER — SODIUM CHLORIDE 0.9 % IJ SOLN: 3.0000 mL | Freq: Two times a day (BID) | INTRAMUSCULAR | Status: DC

## 2012-06-19 MED ORDER — MIDAZOLAM HCL 5 MG/5ML IJ SOLN
INTRAMUSCULAR | Status: AC
  Filled 2012-06-19: qty 5

## 2012-06-19 MED ORDER — LIDOCAINE HCL (PF) 1 % IJ SOLN
INTRAMUSCULAR | Status: AC
  Filled 2012-06-19: qty 60

## 2012-06-19 MED ORDER — MUPIROCIN 2 % EX OINT
TOPICAL_OINTMENT | CUTANEOUS | Status: AC
  Filled 2012-06-19: qty 22

## 2012-06-19 MED ORDER — SODIUM CHLORIDE 0.9 % IJ SOLN: 3.0000 mL | INTRAMUSCULAR | Status: DC | PRN

## 2012-06-19 MED ORDER — CHLORHEXIDINE GLUCONATE 4 % EX LIQD
60.0000 mL | Freq: Once | CUTANEOUS | Status: DC
  Filled 2012-06-19: qty 60

## 2012-06-19 MED ORDER — SODIUM CHLORIDE 0.9 % IV SOLN: 250.0000 mL | INTRAVENOUS | Status: DC

## 2012-06-19 MED ORDER — ACETAMINOPHEN 325 MG PO TABS
325.0000 mg | ORAL_TABLET | ORAL | Status: DC | PRN
  Filled 2012-06-19: qty 2

## 2012-06-19 NOTE — Op Note (Signed)
Jonathon [name], Jonathon [name] NO.:  [id number]  MEDICAL RECORD NO.:  [id number]  LOCATION:  [facility]                         FACILITY:  [facility]  PHYSICIAN:  Doylene Canning. Ladona Ridgel, MD    DATE OF BIRTH:  1935-11-24  DATE OF PROCEDURE:  06/19/2012 DATE OF DISCHARGE:  06/19/2012                              OPERATIVE REPORT   PROCEDURE PERFORMED:  Removal of previously implanted dual-chamber pacemaker and insertion of a new dual-chamber pacemaker.  INDICATIONS:  Symptomatic bradycardia status post pacemaker insertion, with his current device at elective replacement.  INTRODUCTION:  The patient is a very pleasant 77 year old male with symptomatic bradycardia, who has reached elective replacement on this pacemaker.  He is now referred for removal of his older device and insertion of a new one.  DESCRIPTION OF PROCEDURE:  After informed consent was obtained, the patient was taken to the diagnostic EP lab in a fasting state.  After usual preparation and draping, intravenous fentanyl and midazolam were given for sedation.  A 30 mL of lidocaine was infiltrated into the left infraclavicular region.  A 5-cm incision was carried out over this region.  Electrocautery was utilized to dissect down to the fascial plane.  The pacemaker pocket was entered with electrocautery.  Generator was removed with gentle traction.  The R-waves were 6, the P-waves 5. The impedances were stable and the thresholds were both less than a V at 0.5 milliseconds.  With these satisfactory parameters, the old Medtronic impulse dual-chamber pacemaker was removed and the new Medtronic Adaptic L dual-chamber pacemaker, serial #[id number] was connected to the atrial and ventricular leads and placed back in the subcutaneous pocket.  The pocket was irrigated with antibiotic irrigation.  The incision was closed with 2-0 and 3-0 Vicryl.  Benzoin and Steri-Strips were painted on the skin, pressure dressing was applied, the  patient was returned to his room in satisfactory condition.  COMPLICATIONS:  There were no immediate procedure complications.  RESULTS:  This demonstrates successful implantation of a Medtronic dual- chamber pacemaker in a patient with symptomatic bradycardia, who had reached elective replacement on his pacemaker.     Doylene Canning. Ladona Ridgel, MD     GWT/MEDQ  D:  06/19/2012  T:  06/19/2012  Job:  [id number]

## 2012-06-19 NOTE — Interval H&P Note (Signed)
History and Physical Interval Note:  06/19/2012 2:42 PM  Jonathon [name]  has presented today for surgery, with the diagnosis of End of life  The various methods of treatment have been discussed with the patient and family. After consideration of risks, benefits and other options for treatment, the patient has consented to  Procedure(s) (LRB) with comments: PERMANENT PACEMAKER GENERATOR CHANGE (N/A) as a surgical intervention .  The patient's history has been reviewed, patient examined, no change in status, stable for surgery.  I have reviewed the patient's chart and labs.  Questions were answered to the patient's satisfaction.     Leonia Reeves.D.

## 2012-06-19 NOTE — Op Note (Signed)
PPM removal and insertion of a new device without immediate complication. [id number].

## 2012-06-19 NOTE — H&P (View-Only) (Signed)
HPI [name] returns today for followup. He is a very pleasant 77 year old man with symptomatic bradycardia, status post permanent pacemaker insertion, hypertension, and tobacco abuse. His permanent pacemaker has reached elective replacement. He notes that he has had a cold over the last few weeks but no high fever or chills. No syncope. He continues to work part time. No Known Allergies   Current Outpatient Prescriptions  Medication Sig Dispense Refill  . azithromycin (ZITHROMAX Z-PAK) 250 MG tablet Azithromycin 500mg  on day 1, then 250mg  on days 2-4  6 tablet  0  . diltiazem (CARDIZEM LA) 240 MG 24 hr tablet Take 1 tablet (240 mg total) by mouth daily.  90 tablet  3  . irbesartan (AVAPRO) 300 MG tablet Take 1 tablet (300 mg total) by mouth daily.  90 tablet  6  . loratadine (CLARITIN) 10 MG tablet Take 1 tablet (10 mg total) by mouth daily.  30 tablet  0  . meloxicam (MOBIC) 15 MG tablet Take 15 mg by mouth daily.      . methocarbamol (ROBAXIN) 500 MG tablet as needed.       . Tamsulosin HCl (FLOMAX) 0.4 MG CAPS Take 1 capsule (0.4 mg total) by mouth daily.  90 capsule  6  . traMADol (ULTRAM) 50 MG tablet Take 1 tablet (50 mg total) by mouth every 8 (eight) hours as needed.  60 tablet  3     Past Medical History  Diagnosis Date  . BENIGN PROSTATIC HYPERTROPHY, HX OF 10/25/2008  . BRADYCARDIA 10/25/2008  . COLONIC POLYPS, HX OF 11/21/2006  . HYPERTENSION 11/21/2006  . 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 PACEMAKER, PERMANENT 10/25/2008  . TOBACCO ABUSE 10/25/2008    ROS:   All systems reviewed and negative except as noted in the HPI.   Past Surgical History  Procedure Date  . Pacemaker insertion   . Kidney stone surgery   . Colonoscopy [id number]     Family History  Problem Relation Age of Onset  . Breast cancer Sister   . Diabetes type II Brother   . Breast cancer Sister   . Heart attack Father   . Kidney disease Father      History    Social History  . Marital Status: Divorced    Spouse Name: N/A    Number of Children: N/A  . Years of Education: N/A   Occupational History  . Not on file.   Social History Main Topics  . Smoking status: Former Smoker    Quit date: 06/15/2010  . Smokeless tobacco: Never Used  . Alcohol Use: Not on file  . Drug Use: Not on file  . Sexually Active: Not on file   Other Topics Concern  . Not on file   Social History Narrative   Has relocated from [place] approximately 15 months ago. Daughter, history of uterine cancer. Current smoker 2 packs weekly.      BP 139/72  Pulse 87  Ht 5\' 11"  (1.803 m)  Wt 186 lb 9.6 oz (84.641 kg)  BMI 26.03 kg/m2  Physical Exam:  Well appearing 77 year old man, NAD HEENT: Unremarkable Neck:  7 cm JVD, no thyromegally Lungs:  Clear with no wheezes, rales, or rhonchi HEART:  Regular rate rhythm, no murmurs, no rubs, no clicks Abd:  soft, positive bowel sounds, no organomegally, no rebound, no guarding Ext:  2 plus pulses, no edema, no cyanosis, no clubbing Skin:  No rashes no nodules Neuro:  CN II through  XII intact, motor grossly intact  DEVICE  Normal device function.  See PaceArt for details. Device at [facility]  Assess/Plan:

## 2012-07-06 ENCOUNTER — Ambulatory Visit (INDEPENDENT_AMBULATORY_CARE_PROVIDER_SITE_OTHER): Payer: Medicare Other | Admitting: *Deleted

## 2012-07-06 ENCOUNTER — Telehealth: Payer: Self-pay | Admitting: Internal Medicine

## 2012-07-06 ENCOUNTER — Other Ambulatory Visit: Payer: Self-pay | Admitting: Internal Medicine

## 2012-07-06 DIAGNOSIS — I498 Other specified cardiac arrhythmias: Secondary | ICD-10-CM | POA: Diagnosis not present

## 2012-07-06 LAB — PACEMAKER DEVICE OBSERVATION
AL AMPLITUDE: 5.6 mv
AL IMPEDENCE PM: 474 Ohm
AL THRESHOLD: 0.75 V
ATRIAL PACING PM: 1
RV LEAD IMPEDENCE PM: 626 Ohm

## 2012-07-06 NOTE — Progress Notes (Signed)
Pacer check in clinic  

## 2012-07-06 NOTE — Telephone Encounter (Signed)
Pt needs an appointment with Dr.Kwiatkowsk to discuss why he needs a referral to Neurology has not been seen since 01/2012.

## 2012-07-06 NOTE — Telephone Encounter (Signed)
Patient came in stating that he need a referral to a Neurologist. Please assist.

## 2012-07-07 NOTE — Telephone Encounter (Signed)
Pt is sch for Friday 2-28

## 2012-07-07 NOTE — Telephone Encounter (Signed)
lmovm to schedule

## 2012-07-10 ENCOUNTER — Encounter: Payer: Self-pay | Admitting: Internal Medicine

## 2012-07-10 ENCOUNTER — Ambulatory Visit (INDEPENDENT_AMBULATORY_CARE_PROVIDER_SITE_OTHER): Payer: Medicare Other | Admitting: Internal Medicine

## 2012-07-10 VITALS — BP 140/80 | HR 84 | Temp 98.4°F | Resp 18 | Wt 191.0 lb

## 2012-07-10 DIAGNOSIS — I10 Essential (primary) hypertension: Secondary | ICD-10-CM | POA: Diagnosis not present

## 2012-07-10 DIAGNOSIS — R252 Cramp and spasm: Secondary | ICD-10-CM

## 2012-07-10 NOTE — Patient Instructions (Signed)
Limit your sodium (Salt) intake    It is important that you exercise regularly, at least 20 minutes 3 to 4 times per week.  If you develop chest pain or shortness of breath seek  medical attention.  Call or return to clinic prn if these symptoms worsen or fail to improve as anticipated. Muscle Cramps Muscle cramps are due to sudden involuntary muscle contraction. This means you have no control over the tightening of a muscle (or muscles). Often there are no obvious causes. Muscle cramps may occur with overexertion. They may also occur with chilling of the muscles. An example of a muscle chilling activity is swimming. It is uncommon for cramps to be due to a serious underlying disorder. In most cases, muscle cramps improve (or leave) within minutes. CAUSES  Some common causes are:  Injury.  Infections, especially viral.  Abnormal levels of the salts and ions in your blood (electrolytes). This could happen if you are taking water pills (diuretics).  Blood vessel disease where not enough blood is getting to the muscles (intermittent claudication). Some uncommon causes are:  Side effects of some medicine (such as lithium).  Alcohol abuse.  Diseases where there is soreness (inflammation) of the muscular system. HOME CARE INSTRUCTIONS   It may be helpful to massage, stretch, and relax the affected muscle.  Taking a dose of over-the-counter diphenhydramine is helpful for night leg cramps. SEEK MEDICAL CARE IF:  Cramps are frequent and not relieved with medicine. MAKE SURE YOU:   Understand these instructions.  Will watch your condition.  Will get help right away if you are not doing well or get worse. Document Released: 10/19/2001 Document Revised: 07/22/2011 Document Reviewed: 04/20/2008 [facility] Patient Information 2013 [place], [place].

## 2012-07-10 NOTE — Progress Notes (Signed)
Subjective:    Patient ID: Jonathon [name], male    DOB: [birth year], 77 y.o.   MRN: [id number]  HPI  77 year old patient who has treated hypertension. He presents today complaining of cramping involving primarily the left posterior thigh. 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 He often has cramping in his left hamstring region. He is also somewhat concerned about a slight tremor. \ Past Medical History  Diagnosis Date  . BENIGN PROSTATIC HYPERTROPHY, HX OF 10/25/2008  . BRADYCARDIA 10/25/2008  . COLONIC POLYPS, HX OF 11/21/2006  . HYPERTENSION 11/21/2006  . 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 PACEMAKER, PERMANENT 10/25/2008  . TOBACCO ABUSE 10/25/2008    History   Social History  . Marital Status: Divorced    Spouse Name: N/A    Number of Children: N/A  . Years of Education: N/A   Occupational History  . Not on file.   Social History Main Topics  . Smoking status: Former Smoker    Quit date: 06/15/2010  . Smokeless tobacco: Never Used  . Alcohol Use: Not on file  . Drug Use: Not on file  . Sexually Active: Not on file   Other Topics Concern  . Not on file   Social History Narrative   Has relocated from [place] approximately 15 months ago. Daughter, history of uterine cancer. Current smoker 2 packs weekly.     Past Surgical History  Procedure Laterality Date  . Pacemaker insertion    . Kidney stone surgery    . Colonoscopy  [id number]    Family History  Problem Relation Age of Onset  . Breast cancer Sister   . Diabetes type II Brother   . Breast cancer Sister   . Heart attack Father   . Kidney disease Father     No Known Allergies  Current Outpatient Prescriptions on File Prior to Visit  Medication Sig Dispense Refill  . diltiazem (CARDIZEM LA) 240 MG 24 hr tablet Take 1 tablet (240 mg total) by mouth daily.  90 tablet  3  . irbesartan (AVAPRO)  300 MG tablet Take 1 tablet (300 mg total) by mouth daily.  90 tablet  6  . Tamsulosin HCl (FLOMAX) 0.4 MG CAPS Take 1 capsule (0.4 mg total) by mouth daily.  90 capsule  6   No current facility-administered medications on file prior to visit.    BP 140/80  Pulse 84  Temp(Src) 98.4 F (36.9 C) (Oral)  Resp 18  Wt 191 lb (86.637 kg)  BMI 26.65 kg/m2  SpO2 98%       Review of Systems  Constitutional: Negative for fever, chills, appetite change and fatigue.  HENT: Negative for hearing loss, ear pain, congestion, sore throat, trouble swallowing, neck stiffness, dental problem, voice change and tinnitus.   Eyes: Negative for pain, discharge and visual disturbance.  Respiratory: Negative for cough, chest tightness, wheezing and stridor.   Cardiovascular: Negative for chest pain, palpitations and leg swelling.  Gastrointestinal: Negative for nausea, vomiting, abdominal pain, diarrhea, constipation, blood in stool and abdominal distention.  Genitourinary: Negative for urgency, hematuria, flank pain, discharge, difficulty urinating and genital sores.  Musculoskeletal: Positive for myalgias. Negative for back pain, joint swelling, arthralgias and gait problem.  Skin: Negative for rash.  Neurological: Positive for tremors. Negative for dizziness, syncope, speech difficulty, weakness, numbness and headaches.  Hematological: Negative for adenopathy. Does not bruise/bleed easily.  Psychiatric/Behavioral: Negative for behavioral problems and dysphoric mood. The patient is not nervous/anxious.        Objective:   Physical Exam  Constitutional: He appears well-developed and well-nourished. No distress.  Cardiovascular: Intact distal pulses.   Musculoskeletal: Normal range of motion. He exhibits no edema and no tenderness.  Neurological:  Physiologic tremor only          Assessment & Plan:   Muscle cramps. In part due to overuse he exercises quite vigorously. He'll attempt to hydrate  and take more electrolytes Hypertension stable

## 2012-07-23 ENCOUNTER — Encounter: Payer: Self-pay | Admitting: Internal Medicine

## 2012-09-04 ENCOUNTER — Other Ambulatory Visit: Payer: Self-pay | Admitting: Internal Medicine

## 2012-09-22 ENCOUNTER — Ambulatory Visit (INDEPENDENT_AMBULATORY_CARE_PROVIDER_SITE_OTHER): Payer: Medicare Other | Admitting: Internal Medicine

## 2012-09-22 ENCOUNTER — Encounter: Payer: Self-pay | Admitting: Internal Medicine

## 2012-09-22 VITALS — BP 140/80 | HR 60 | Ht 71.0 in | Wt 191.0 lb

## 2012-09-22 DIAGNOSIS — Z950 Presence of cardiac pacemaker: Secondary | ICD-10-CM

## 2012-09-22 DIAGNOSIS — I10 Essential (primary) hypertension: Secondary | ICD-10-CM | POA: Diagnosis not present

## 2012-09-22 DIAGNOSIS — I498 Other specified cardiac arrhythmias: Secondary | ICD-10-CM

## 2012-09-22 LAB — PACEMAKER DEVICE OBSERVATION
AL IMPEDENCE PM: 474 Ohm
AL THRESHOLD: 0.75 V
ATRIAL PACING PM: 0
RV LEAD THRESHOLD: 0.5 V
VENTRICULAR PACING PM: 14

## 2012-09-22 NOTE — Assessment & Plan Note (Signed)
His Medtronic DD PPM is working normally. Will recheck in several months.

## 2012-09-22 NOTE — Patient Instructions (Addendum)
Your physician wants you to follow-up in: 12 months with Dr Taylor You will receive a reminder letter in the mail two months in advance. If you don't receive a letter, please call our office to schedule the follow-up appointment.   Remote monitoring is used to monitor your Pacemaker of ICD from home. This monitoring reduces the number of office visits required to check your device to one time per year. It allows us to keep an eye on the functioning of your device to ensure it is working properly. You are scheduled for a device check from home on 12/28/12. You may send your transmission at any time that day. If you have a wireless device, the transmission will be sent automatically. After your physician reviews your transmission, you will receive a postcard with your next transmission date.   

## 2012-09-22 NOTE — Assessment & Plan Note (Signed)
His blood pressure remains controlled. I've encouraged the patient to increase his physical activity, maintain a low-sodium diet, and continue his current medications.

## 2012-09-22 NOTE — Progress Notes (Signed)
HPI [name] returns today for followup. He is a pleasant 77 yo man with a h/o symptomatic bradycardia, s/p PPM. He has done well in the interim. No chest pain or sob. No syncope. He has retired from working at [employer]. He has minimal residual edema in the left foot. No Known Allergies   Current Outpatient Prescriptions  Medication Sig Dispense Refill  . diltiazem (CARDIZEM LA) 240 MG 24 hr tablet Take 1 tablet (240 mg total) by mouth daily.  90 tablet  3  . irbesartan (AVAPRO) 300 MG tablet Take 1 tablet (300 mg total) by mouth daily.  90 tablet  6  . MATZIM LA 240 MG 24 hr tablet TAKE ONE TABLET BY MOUTH DAILY  90 tablet  0  . Tamsulosin HCl (FLOMAX) 0.4 MG CAPS Take 1 capsule (0.4 mg total) by mouth daily.  90 capsule  6   No current facility-administered medications for this visit.     Past Medical History  Diagnosis Date  . BENIGN PROSTATIC HYPERTROPHY, HX OF 10/25/2008  . BRADYCARDIA 10/25/2008  . COLONIC POLYPS, HX OF 11/21/2006  . HYPERTENSION 11/21/2006  . 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 PACEMAKER, PERMANENT 10/25/2008  . TOBACCO ABUSE 10/25/2008    ROS:   All systems reviewed and negative except as noted in the HPI.   Past Surgical History  Procedure Laterality Date  . Pacemaker insertion    . Kidney stone surgery    . Colonoscopy  [id number]     Family History  Problem Relation Age of Onset  . Breast cancer Sister   . Diabetes type II Brother   . Breast cancer Sister   . Heart attack Father   . Kidney disease Father      History   Social History  . Marital Status: Divorced    Spouse Name: N/A    Number of Children: N/A  . Years of Education: N/A   Occupational History  . Not on file.   Social History Main Topics  . Smoking status: Former Smoker    Quit date: 06/15/2010  . Smokeless tobacco: Never Used  . Alcohol Use: Not on file  . Drug Use: Not on file  . Sexually Active: Not on file   Other Topics  Concern  . Not on file   Social History Narrative   Has relocated from [place] approximately 15 months ago. Daughter, history of uterine cancer. Current smoker 2 packs weekly.      BP 140/80  Pulse 60  Ht 5\' 11"  (1.803 m)  Wt 191 lb (86.637 kg)  BMI 26.65 kg/m2  Physical Exam:  Well appearing 77 yo man, NAD HEENT: Unremarkable Neck:  6 cm JVD, no thyromegally Back:  No CVA tenderness Lungs:  Clear with no wheezes, rales, or rhonchi, well healed PPM incision HEART:  Regular rate rhythm, no murmurs, no rubs, no clicks Abd:  soft, positive bowel sounds, no organomegally, no rebound, no guarding Ext:  2 plus pulses, no edema, no cyanosis, no clubbing Skin:  No rashes no nodules Neuro:  CN II through XII intact, motor grossly intact  DEVICE  Normal device function.  See PaceArt for details.   Assess/Plan:

## 2012-10-29 ENCOUNTER — Encounter: Payer: Self-pay | Admitting: *Deleted

## 2012-11-10 ENCOUNTER — Encounter: Payer: Medicare Other | Admitting: Internal Medicine

## 2012-12-18 ENCOUNTER — Ambulatory Visit (INDEPENDENT_AMBULATORY_CARE_PROVIDER_SITE_OTHER): Payer: Medicare Other | Admitting: Internal Medicine

## 2012-12-18 ENCOUNTER — Encounter: Payer: Self-pay | Admitting: Internal Medicine

## 2012-12-18 VITALS — BP 156/90 | HR 78 | Temp 98.6°F | Resp 20 | Wt 195.0 lb

## 2012-12-18 DIAGNOSIS — R252 Cramp and spasm: Secondary | ICD-10-CM | POA: Diagnosis not present

## 2012-12-18 DIAGNOSIS — Z950 Presence of cardiac pacemaker: Secondary | ICD-10-CM | POA: Diagnosis not present

## 2012-12-18 DIAGNOSIS — F17200 Nicotine dependence, unspecified, uncomplicated: Secondary | ICD-10-CM | POA: Diagnosis not present

## 2012-12-18 DIAGNOSIS — I10 Essential (primary) hypertension: Secondary | ICD-10-CM | POA: Diagnosis not present

## 2012-12-18 NOTE — Progress Notes (Signed)
Subjective:    Patient ID: Jonathon [name], male    DOB: 1935/07/07, 77 y.o.   MRN: [id number]  HPI  77 year old patient who is seen today with a chief complaint of requesting neurology referral.  For the past year he has had cramping involving the hamstring regions the left side much greater than the right. He states his symptoms began last summer. 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 More recently he complains of some slight balance issues. He is also concerned about muscle atrophy involving the hamstring as well as decrease in strength. His symptoms seem confined to hamstring region only and denies any calf or quadriceps symptoms. He denies any back pain or any bowel or bladder symptoms. Denies any weight loss or other constitutional complaints. He was seen in February of this year with a chief complaint of cramping involving the left hamstring area. He states that his workouts include 4 sets with 10 reps of squatting with a 50 pound weight to improve strength.  Wt Readings from Last 3 Encounters:  12/18/12 195 lb (88.451 kg)  09/22/12 191 lb (86.637 kg)  07/10/12 191 lb (86.637 kg)    Past Medical History  Diagnosis Date  . BENIGN PROSTATIC HYPERTROPHY, HX OF 10/25/2008  . BRADYCARDIA 10/25/2008  . HYPERTENSION 11/21/2006  . 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 PACEMAKER, PERMANENT 10/25/2008  . TOBACCO ABUSE 10/25/2008    History   Social History  . Marital Status: Divorced    Spouse Name: N/A    Number of Children: N/A  . Years of Education: N/A   Occupational History  . Not on file.   Social History Main Topics  . Smoking status: Former Smoker    Quit date: 06/15/2010  . Smokeless tobacco: Never Used  . Alcohol Use: Not on file  . Drug Use: Not on file  . Sexually Active: Not on file   Other Topics Concern  . Not on file   Social History  Narrative   Has relocated from [place] approximately 15 months ago. Daughter, history of uterine cancer. Current smoker 2 packs weekly.     Past Surgical History  Procedure Laterality Date  . Pacemaker insertion    . Kidney stone surgery    . Colonoscopy  [id number]    Family History  Problem Relation Age of Onset  . Breast cancer Sister   . Diabetes type II Brother   . Breast cancer Sister   . Heart attack Father   . Kidney disease Father     No Known Allergies  Current Outpatient Prescriptions on File Prior to Visit  Medication Sig Dispense Refill  . diltiazem (CARDIZEM LA) 240 MG 24 hr tablet Take 1 tablet (240 mg total) by mouth daily.  90 tablet  3  . irbesartan (AVAPRO) 300 MG tablet Take 1 tablet (300 mg total) by mouth daily.  90 tablet  6  . Tamsulosin HCl (FLOMAX) 0.4 MG CAPS Take 1 capsule (0.4 mg total) by mouth daily.  90 capsule  6   No current facility-administered medications on file prior to visit.    BP 156/90  Pulse 78  Temp(Src) 98.6 F (37 C) (Oral)  Resp 20  Wt 195 lb (88.451 kg)  BMI 27.21 kg/m2  SpO2 99%       Review of Systems  Constitutional: Negative for fever, chills, appetite change and fatigue.  HENT: Negative  for hearing loss, ear pain, congestion, sore throat, trouble swallowing, neck stiffness, dental problem, voice change and tinnitus.   Eyes: Negative for pain, discharge and visual disturbance.  Respiratory: Negative for cough, chest tightness, wheezing and stridor.   Cardiovascular: Negative for chest pain, palpitations and leg swelling.  Gastrointestinal: Negative for nausea, vomiting, abdominal pain, diarrhea, constipation, blood in stool and abdominal distention.  Genitourinary: Negative for urgency, hematuria, flank pain, discharge, difficulty urinating and genital sores.  Musculoskeletal: Positive for arthralgias. Negative for myalgias, back pain, joint swelling and gait problem.  Skin: Negative for rash.  Neurological:  Negative for dizziness, syncope, speech difficulty, weakness, numbness and headaches.  Hematological: Negative for adenopathy. Does not bruise/bleed easily.  Psychiatric/Behavioral: Negative for behavioral problems and dysphoric mood. The patient is not nervous/anxious.        Objective:   Physical Exam  Constitutional: He is oriented to person, place, and time. He appears well-developed.  HENT:  Head: Normocephalic.  Right Ear: External ear normal.  Left Ear: External ear normal.  Eyes: Conjunctivae and EOM are normal.  Neck: Normal range of motion.  Cardiovascular: Normal rate and normal heart sounds.   Pulmonary/Chest: Breath sounds normal.  Abdominal: Bowel sounds are normal.  Musculoskeletal: Normal range of motion. He exhibits no edema and no tenderness.  No hamstring tenderness or obvious muscle atrophy The patient is able to do multiple squats without difficulty  Neurological: He is alert and oriented to person, place, and time.  Reflexes are generally somewhat depressed but equal Able walk on toes and heels without difficulty Heel-to-shin testing normal Romberg negative Able to do a tandem walk  But  slightly unsteady  Psychiatric: He has a normal mood and affect. His behavior is normal.          Assessment & Plan:    History of hamstring cramping. Patient is concerned about weakness and atrophy. Exam is unremarkable. We'll check laboratory screen including a CPK We'll refer to neurology per his request  Hypertension repeat blood pressure 150/90. We'll continue present regimen. In view of his cramping we'll attempt to hold diuretic therapy  Status post permanent pacemaker insertion Tobacco abuse (neg CXR 2/14)- smoking cessation encouraged

## 2012-12-18 NOTE — Patient Instructions (Addendum)
Limit your sodium (Salt) intake  Please check your blood pressure on a regular basis.  If it is consistently greater than 150/90, please make an office appointment.  Neurology appointment as requested  Smoking tobacco is very bad for your health. You should stop smoking immediately.

## 2012-12-28 ENCOUNTER — Encounter: Payer: Self-pay | Admitting: Internal Medicine

## 2012-12-28 ENCOUNTER — Ambulatory Visit (INDEPENDENT_AMBULATORY_CARE_PROVIDER_SITE_OTHER): Payer: Medicare Other | Admitting: *Deleted

## 2012-12-28 DIAGNOSIS — I498 Other specified cardiac arrhythmias: Secondary | ICD-10-CM

## 2012-12-28 DIAGNOSIS — Z950 Presence of cardiac pacemaker: Secondary | ICD-10-CM

## 2013-01-06 ENCOUNTER — Emergency Department (INDEPENDENT_AMBULATORY_CARE_PROVIDER_SITE_OTHER): Payer: Medicare Other

## 2013-01-06 ENCOUNTER — Emergency Department (INDEPENDENT_AMBULATORY_CARE_PROVIDER_SITE_OTHER)
Admission: EM | Admit: 2013-01-06 | Discharge: 2013-01-06 | Disposition: A | Discharge status: Home / Self Care | Payer: Medicare Other | Source: Home / Self Care

## 2013-01-06 ENCOUNTER — Encounter (HOSPITAL_COMMUNITY): Payer: Self-pay | Admitting: Emergency Medicine

## 2013-01-06 DIAGNOSIS — M7022 Olecranon bursitis, left elbow: Secondary | ICD-10-CM

## 2013-01-06 DIAGNOSIS — M7020 Olecranon bursitis, unspecified elbow: Secondary | ICD-10-CM

## 2013-01-06 MED ORDER — CEPHALEXIN 500 MG PO CAPS: 500.0000 mg | ORAL_CAPSULE | Freq: Four times a day (QID) | ORAL | Status: DC

## 2013-01-06 MED ORDER — HYDROCODONE-ACETAMINOPHEN 5-325 MG PO TABS: 2.0000 | ORAL_TABLET | ORAL | Status: DC | PRN

## 2013-01-06 MED ORDER — IBUPROFEN 800 MG PO TABS: 800.0000 mg | ORAL_TABLET | Freq: Three times a day (TID) | ORAL | Status: DC
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
## 2013-01-06 NOTE — ED Provider Notes (Signed)
CSN: [id number]     Arrival date & time 01/06/13  7829 History   First MD Initiated Contact with Patient 01/06/13 [phone]     Chief Complaint  Patient presents with  . Cyst   (Consider location/radiation/quality/duration/timing/severity/associated sxs/prior Treatment) Patient is a 77 y.o. male presenting with arm injury. The history is provided by the patient. No language interpreter was used.  Arm Injury Location:  Elbow Time since incident:  3 days Injury: yes   Elbow location:  L elbow Pain details:    Quality:  Aching   Severity:  Moderate   Timing:  Constant   Progression:  Worsening Chronicity:  New Ineffective treatments:  None tried Pt complains of swelling to elbow after hitting it  Past Medical History  Diagnosis Date  . BENIGN PROSTATIC HYPERTROPHY, HX OF 10/25/2008  . BRADYCARDIA 10/25/2008  . HYPERTENSION 11/21/2006  . 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 PACEMAKER, PERMANENT 10/25/2008  . TOBACCO ABUSE 10/25/2008   Past Surgical History  Procedure Laterality Date  . Pacemaker insertion    . Kidney stone surgery    . Colonoscopy  [id number]   Family History  Problem Relation Age of Onset  . Breast cancer Sister   . Diabetes type II Brother   . Breast cancer Sister   . Heart attack Father   . Kidney disease Father    History  Substance Use Topics  . Smoking status: Former Smoker    Quit date: 06/15/2010  . Smokeless tobacco: Never Used  . Alcohol Use: Yes    Review of Systems  Musculoskeletal: Positive for myalgias and joint swelling.  All other systems reviewed and are negative.    Allergies  Review of patient's allergies indicates no known allergies.  Home Medications   Current Outpatient Rx  Name  Route  Sig  Dispense  Refill  . diltiazem (CARDIZEM LA) 240 MG 24 hr tablet   Oral   Take 1 tablet (240 mg total) by mouth daily.   90 tablet   3   . irbesartan (AVAPRO) 300 MG tablet   Oral   Take 1 tablet (300 mg  total) by mouth daily.   90 tablet   6   . Tamsulosin HCl (FLOMAX) 0.4 MG CAPS   Oral   Take 1 capsule (0.4 mg total) by mouth daily.   90 capsule   6    BP 151/75  Pulse 108  Temp(Src) 100.2 F (37.9 C) (Oral)  Resp 18  SpO2 100% Physical Exam  Nursing note and vitals reviewed. Constitutional: He is oriented to person, place, and time. He appears well-developed and well-nourished.  HENT:  Head: Normocephalic and atraumatic.  Eyes: Conjunctivae and EOM are normal. Pupils are equal, round, and reactive to light.  Musculoskeletal: He exhibits tenderness.  Swollen left elbow bursa, warm to touch,    Neurological: He is alert and oriented to person, place, and time. He has normal reflexes.  Skin: Skin is warm.  Psychiatric: He has a normal mood and affect.    ED Course  Procedures (including critical care time) Labs Review Labs Reviewed - No data to display Imaging Review Dg Elbow Complete Left  01/06/2013   *RADIOLOGY REPORT*  Clinical Data: Blow to the left elbow 2 days ago with pain and swelling.  LEFT ELBOW - COMPLETE 3+ VIEW  Comparison: None.  Findings: There is marked soft tissue swelling about the posterior aspect of the elbow.  Large enthesophyte at the triceps tendon  insertion is noted.  Degenerative change is present about the elbow.  There is no fracture, dislocation, radiopaque foreign body or joint effusion.  IMPRESSION:  1.  Findings consistent with olecranon bursitis. 2.  Large enthesophyte right triceps tendon insertion. 3.  No acute bony abnormality.   Original Report Authenticated By: Holley Dexter, M.D.    MDM   1. Bursitis, olecranon, left    Traumatic burstitis,  Pt given rx for ibuprofen, keflex and hydrocodone.   Pt advised to see his Md for recheck on Monday.  Return here ir fever or incresing redness    Elson Areas, [place] 01/06/13 1053

## 2013-01-06 NOTE — ED Provider Notes (Signed)
Medical screening examination/treatment/procedure(s) were performed by a resident physician or non-physician practitioner and as the supervising physician I was immediately available for consultation/collaboration.  Jenai Scaletta, MD   Melody Cirrincione S Charleene Callegari, MD 01/06/13 1658 

## 2013-01-08 LAB — REMOTE PACEMAKER DEVICE
AL AMPLITUDE: 2.8 mv
AL IMPEDENCE PM: 467 Ohm
BATTERY VOLTAGE: 2.78 V
RV LEAD AMPLITUDE: 16 mv

## 2013-01-13 ENCOUNTER — Encounter: Payer: Self-pay | Admitting: Neurology

## 2013-01-13 ENCOUNTER — Ambulatory Visit (INDEPENDENT_AMBULATORY_CARE_PROVIDER_SITE_OTHER): Payer: Medicare Other | Admitting: Neurology

## 2013-01-13 VITALS — BP 118/72 | HR 76 | Temp 98.1°F | Resp 20 | Ht 71.0 in | Wt 187.0 lb

## 2013-01-13 DIAGNOSIS — R29898 Other symptoms and signs involving the musculoskeletal system: Secondary | ICD-10-CM

## 2013-01-13 DIAGNOSIS — M6281 Muscle weakness (generalized): Secondary | ICD-10-CM

## 2013-01-13 LAB — CK: Total CK: 55 U/L (ref 7–232)

## 2013-01-13 NOTE — Patient Instructions (Addendum)
1.  Bloodwork today 2.  EMG of the left lower extremity next week. 3.  EMG of the right upper extremity the week after. 4.  Return to the clinic in 2-weeks

## 2013-01-13 NOTE — Progress Notes (Signed)
[facility] Note - Initial Visit   Date: January 13, 2013  Jonathon [name] MRN: [id number] DOB: [birth year]  Dear Dr Amador Cunas:  Thank you for your kind referral of Jonathon [name] for bilateral leg weakness. Although his history is well known to you, please allow [place] to reiterate it for the purpose of our medical record. The patient was accompanied to the clinic by self.   History of Present Illness: Jonathon [name] is a 77 y.o. year-old right-handed African American male with history of BPH, hypertension, bradycardia s/p pacemaker, and previous tobacco use presenting for evaluation of bilateral leg weakness.  Patient was in his usual state of health until 2013.  He has always been physical active and would go to the gym daily (walking up 10 miles/day).  During the spring of 2013, he developed relatively subacute onset of left > right hamstring soreness without an inciting event. Pain initially started in the butt area and radiated down.  There is mild soreness of the calf bilaterally.  It is exacerbated by prolonged sitting, such as when driving.  There is associated proximal leg weakness and loss of muscle mass.   He tried doing 50lb squats at the gym to strengthen his quadriceps over the past month but has not noticed any change in his symptoms.  Functionally, he is still able to complete all his usual activities, but has became much more cautious when climbing stairs. He has not had any falls.  Denies any weakness of his arms, face, swallowing, or speech. Denies abnormal twitches, muscle cramping, tea-colored urine, or sensory disturbances.    At times, he notices a mild tremor when he is writing or with strenuous exertion (weight lifting).  Appetite has been poor because of lack of taste.  He eats one meal a day and recently starting taking whey protein shakes.  His mood is well, but he is understandably concerned about his weakness.  Hobbies include going to the gym and  gardening.    Past Medical History  Diagnosis Date  . BENIGN PROSTATIC HYPERTROPHY, HX OF 10/25/2008  . BRADYCARDIA 10/25/2008  . HYPERTENSION 11/21/2006  . 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 PACEMAKER, PERMANENT 10/25/2008  . TOBACCO ABUSE 10/25/2008    Quit 2012    Past Surgical History  Procedure Laterality Date  . Pacemaker insertion  2005  . Kidney stone surgery    . Colonoscopy  [id number]     Medications:  Current Outpatient Prescriptions on File Prior to Visit  Medication Sig Dispense Refill  . cephALEXin (KEFLEX) 500 MG capsule Take 1 capsule (500 mg total) by mouth 4 (four) times daily.  40 capsule  0  . diltiazem (CARDIZEM LA) 240 MG 24 hr tablet Take 1 tablet (240 mg total) by mouth daily.  90 tablet  3  . ibuprofen (ADVIL,MOTRIN) 800 MG tablet Take 1 tablet (800 mg total) by mouth 3 (three) times daily.  21 tablet  0  . irbesartan (AVAPRO) 300 MG tablet Take 1 tablet (300 mg total) by mouth daily.  90 tablet  6  . Tamsulosin HCl (FLOMAX) 0.4 MG CAPS Take 1 capsule (0.4 mg total) by mouth daily.  90 capsule  6   No current facility-administered medications on file prior to visit.    Allergies: No Known Allergies  Family History: Family History  Problem Relation Age of Onset  . Breast cancer Sister     Living, 74  . Diabetes type II Brother  Living, 67  . Breast cancer Sister     Living, 59  . Heart attack Father     Died, 6  . Kidney disease Father   . Parkinson's disease Mother     Died, 46  . Diabetes Daughter     Living, 62  . Diabetes Son     Living, 36    Social History: History   Social History  . Marital Status: Divorced    Spouse Name: N/A    Number of Children: N/A  . Years of Education: N/A   Occupational History  . Not on file.   Social History Main Topics  . Smoking status: Former Smoker    Quit date: 06/15/2010  . Smokeless tobacco: Never Used  . Alcohol Use: Yes     Comment: Wine  . Drug  Use: Not on file  . Sexual Activity: Not on file   Other Topics Concern  . Not on file   Social History Narrative   Has relocated from [place] in 2007. 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    Review of Systems:  CONSTITUTIONAL: No fevers, chills, night sweats, or weight loss.   EYES: No visual changes or eye pain ENT: No hearing changes.  No history of nose bleeds.   RESPIRATORY: No cough, wheezing and shortness of breath.   CARDIOVASCULAR: Negative for chest pain, and palpitations.   GI: Negative for abdominal discomfort, blood in stools or black stools.  No recent change in bowel habits.   GU:  No history of incontinence.   MUSCLOSKELETAL: No history of joint pain or swelling. + muscle soreness SKIN: Negative for lesions, rash, and itching.   HEMATOLOGY/ONCOLOGY: Negative for prolonged bleeding, bruising easily, and swollen nodes.  No history of cancer.   ENDOCRINE: Negative for cold or heat intolerance, polydipsia or goiter.   PSYCH:  No depression or anxiety symptoms.   NEURO: As Above.   Vital Signs:  BP 118/72  Pulse 76  Temp(Src) 98.1 F (36.7 C) (Oral)  Resp 20  Ht 5\' 11"  (1.803 m)  Wt 187 lb (84.823 kg)  BMI 26.09 kg/m2 Pain Scale: 0 on a scale of 0-10   General Medical Exam: General:  Well appearing, comfortable.   Eyes/ENT: see cranial nerve examination.   Neck: No masses appreciated.  Full range of motion without tenderness.  Respiratory:  Clear to auscultation, good air entry bilaterally.  Mild gynecomastia.  Cardiac:  Regular rate and rhythm, no murmur.  PPM noted in left upper chest. GI:  Soft, non-tender, non-distended abdomen.  Bowel sounds normal. No masses, organomegaly.   Back:  No pain to palpation of spinous processes.   Extremities:  No deformities, edema, or skin discoloration.   Skin:  Skin color, texture, turgor normal. No rashes or lesions.  Neurological Exam: MENTAL STATUS including orientation to time, place, person, recent  and remote memory, attention span and concentration, language, and fund of knowledge is normal.  Speech is not dysarthric.  CRANIAL NERVES: II:  No visual field defects.  Unremarkable fundi.   III-IV-VI: Pupils equal round and reactive to light.  Normal conjugate, extra-ocular eye movements in all directions of gaze.  No nystagmus.  No ptosis prior to or post sustained upgaze, there is redundancy of the skin folds over the upper eyelids.   V:  Normal facial sensation.  Jaw jerk is present, but not brisk.   VII:  Normal facial symmetry and movements.  Mild palmomental reflex bilaterally.  No snout reflex or Myerson's sign.   VIII:  Normal hearing and vestibular function.   IX-X:  Normal palatal movement.   XI:  Normal shoulder shrug and head rotation.   XII:  Normal tongue strength and range of motion, no deviation or fasciculation.  MOTOR:  Moderate atrophy of bilateral quadriceps muscles (L >R) and severe atrophy of the FDI bilaterally, ABP, and ADM (lesser degree).  Very rare fasciculation was seen in the left FDI and right triceps.   No pronator drift.  Tone is normal.    Right Upper Extremity:    Left Upper Extremity:    Deltoid  5/5   Deltoid  5/5   Biceps  5/5   Biceps  5/5   Triceps  5-/5   Triceps  5/5   Wrist extensors  5/5   Wrist extensors  5/5   Wrist flexors  5/5   Wrist flexors  5/5   Finger extensors  5/5   Finger extensors  5/5   Finger flexors  5/5   Finger flexors  5/5   FDI  4-/5  FDI 4-/5  adm 4/5  ADM  4/5  Abductor pollicis  3+/5   Abductor pollicis  3+/5   Tone (Ashworth scale)  0  Tone (Ashworth scale)  0   Right Lower Extremity:    Left Lower Extremity:    Hip flexors  5-/5   Hip flexors  5-/5   Hip extensors  5/5   Hip extensors  5/5   Knee flexors  5/5   Knee flexors  5/5   Knee extensors  5/5   Knee extensors  5/5   Dorsiflexors  5/5   Dorsiflexors  5/5   Plantarflexors  5/5   Plantarflexors  5/5   Toe extensors  5/5   Toe extensors  5/5   Toe flexors   5/5   Toe flexors  5/5   Tone (Ashworth scale)  0  Tone (Ashworth scale)  0   MSRs:  Right                                                                 Left brachioradialis 2+  brachioradialis 2+  biceps 2+  biceps 2+  triceps 2+  triceps 2+  patellar 2+  patellar 2+  ankle jerk absent  ankle jerk absent  Hoffman no  Hoffman no  plantar response mute  plantar response mute  No crossed adductors.    SENSORY:  Reduced vibration distal to ankles bilaterally and impaired proprioception bilaterally with normal and symmetric perception of light touch, and pinprick.  Romberg's sign present.     COORDINATION/GAIT: Normal finger-to- nose-finger and heel-to-shin.  Intact rapid alternating movements bilaterally.  Able to rise from a chair without using arms.  Gait narrow based and stable. Unsteady with heel-walking and tandem gait.    IMPRESSION: Mr. Corado is a delightful 77 year-old gentleman presenting for evaluation of leg weakness and loss of muscle bulk. His neurological exam discloses atrophy and weakness of the intrinsic hand muscles (FDI/ABP > ADM) and quadriceps. There are very rare fasciculations seen in the left FDI and right triceps.  No bulbar weakness is present and pathological facial reflexes are very subtle.  There is no spasticity or hyperreflexia of  the limbs.  Sensory examination reveals distal predominant large fiber sensory loss.  Given his constellation of predominant lower MND symptoms, he warrants an evaluation for myopathy, neuropathy, and motor neuron disease.  His sensory changes, mild gynocomastia, and weakness would make Kennedy's disease a possibility, too.     PLAN/RECOMMENDATIONS:  1.  CK, aldolase, SPEP with IFE on serum and urine, TSH, copper, ceruloplasmin, PTH 2.  EMG of the left lower extremity and right upper extremity - MND protocol 3.  Return to clinic in 3 weeks    The duration of this appointment visit was 60 minutes of face-to-face time with the  patient.  At least 50% of this time was spent in counseling, explanation of diagnosis, planning of further management, and coordination of care.   Thank you for allowing me to participate in patient's care.  If I can answer any additional questions, I would be pleased to do so.    Sincerely,    Samar Dass K. Allena Katz, DO

## 2013-01-14 ENCOUNTER — Ambulatory Visit (INDEPENDENT_AMBULATORY_CARE_PROVIDER_SITE_OTHER): Payer: Medicare Other | Admitting: Internal Medicine

## 2013-01-14 ENCOUNTER — Encounter: Payer: Self-pay | Admitting: Internal Medicine

## 2013-01-14 VITALS — BP 130/74 | HR 91 | Temp 98.3°F | Resp 20 | Ht 70.5 in | Wt 189.0 lb

## 2013-01-14 DIAGNOSIS — N200 Calculus of kidney: Secondary | ICD-10-CM

## 2013-01-14 DIAGNOSIS — Z950 Presence of cardiac pacemaker: Secondary | ICD-10-CM

## 2013-01-14 DIAGNOSIS — Z860100 Personal history of colon polyps, unspecified: Secondary | ICD-10-CM

## 2013-01-14 DIAGNOSIS — R7989 Other specified abnormal findings of blood chemistry: Secondary | ICD-10-CM

## 2013-01-14 DIAGNOSIS — Z23 Encounter for immunization: Secondary | ICD-10-CM | POA: Diagnosis not present

## 2013-01-14 DIAGNOSIS — M6281 Muscle weakness (generalized): Secondary | ICD-10-CM | POA: Diagnosis not present

## 2013-01-14 DIAGNOSIS — Z0000 Encounter for general adult medical examination without abnormal findings: Secondary | ICD-10-CM

## 2013-01-14 DIAGNOSIS — I10 Essential (primary) hypertension: Secondary | ICD-10-CM

## 2013-01-14 LAB — COMPREHENSIVE METABOLIC PANEL
ALT: 58 U/L — ABNORMAL HIGH (ref 0–53)
AST: 77 U/L — ABNORMAL HIGH (ref 0–37)
Albumin: 3.5 g/dL (ref 3.5–5.2)
Alkaline Phosphatase: 95 U/L (ref 39–117)
Glucose, Bld: 91 mg/dL (ref 70–99)
Potassium: 5.1 mEq/L (ref 3.5–5.1)
Sodium: 134 mEq/L — ABNORMAL LOW (ref 135–145)
Total Protein: 7.8 g/dL (ref 6.0–8.3)

## 2013-01-14 LAB — CBC WITH DIFFERENTIAL/PLATELET
Eosinophils Absolute: 0.1 10*3/uL (ref 0.0–0.7)
MCHC: 32.5 g/dL (ref 30.0–36.0)
MCV: 80.6 fl (ref 78.0–100.0)
Monocytes Absolute: 0.9 10*3/uL (ref 0.1–1.0)
Neutrophils Relative %: 52.1 % (ref 43.0–77.0)
Platelets: 242 10*3/uL (ref 150.0–400.0)
RDW: 16 % — ABNORMAL HIGH (ref 11.5–14.6)

## 2013-01-14 LAB — LIPID PANEL
HDL: 53.6 mg/dL (ref >39.00)
VLDL: 41.2 mg/dL — ABNORMAL HIGH (ref 0.0–40.0)

## 2013-01-14 LAB — CK: Total CK: 59 U/L (ref 7–232)

## 2013-01-14 LAB — SEDIMENTATION RATE: Sed Rate: 59 mm/hr — ABNORMAL HIGH (ref 0–22)

## 2013-01-14 MED ORDER — DILTIAZEM HCL ER COATED BEADS 240 MG PO TB24: 240.0000 mg | ORAL_TABLET | Freq: Every day | ORAL | Status: DC

## 2013-01-14 MED ORDER — TAMSULOSIN HCL 0.4 MG PO CAPS: 0.4000 mg | ORAL_CAPSULE | Freq: Every day | ORAL | Status: DC

## 2013-01-14 MED ORDER — IRBESARTAN 300 MG PO TABS: 300.0000 mg | ORAL_TABLET | Freq: Every day | ORAL | Status: DC

## 2013-01-14 NOTE — Patient Instructions (Signed)
Neurology followup as planned  Limit your sodium (Salt) intake    It is important that you exercise regularly, at least 20 minutes 3 to 4 times per week.  If you develop chest pain or shortness of breath seek  medical attention.  Schedule your colonoscopy to help detect colon cancer.

## 2013-01-14 NOTE — Progress Notes (Signed)
Patient ID: Jonathon [name], male   DOB: 03-08-1936, 77 y.o.   MRN: [id number] Patient ID: Jonathon [name], male   DOB: 16-Jul-1935, 77 y.o.   MRN: [id number]  Subjective:    Patient ID: Jonathon [name], male    DOB: 06/30/1935, 77 y.o.   MRN: [id number]  Hypertension Pertinent negatives include no chest pain, headaches, neck pain, palpitations or shortness of breath.    History of Present Illness:   77  year-old patient seen today for a comprehensive evaluation. He has a history of treated hypertension. Is followed by cardiology, status post permanent pacemaker insertion. He has BPH, history of colonic polyps and also a history of nephrolithiasis. He has a history of  tobacco use but discontinued tobacco in February of 2012;  Last colonoscopy 2009.  He has had a recent neurologic examination do to muscle weakness and atrophy and evaluation is in progress   Problems Prior to Update:   1) Nephrolithiasis (ICD-592.0)  2) Colonic Polyps (ICD-211.3)  3) Tobacco Abuse (ICD-305.1)  4) Benign Prostatic Hypertrophy, Hx of (ICD-V13.8)  5) Pacemaker, Permanent (ICD-V45.01)  6) Bradycardia (ICD-427.89)  7) Nephrolithiasis, Hx of (ICD-V13.01)  8) Colonic Polyps, Hx of (ICD-V12.72)  9) Benign Prostatic Hypertrophy (ICD-600.00)  10) Hypertension (ICD-401.9)   Medications Prior to Update:  1) Avapro 300 Mg Tabs (Irbesartan) .Marland Kitchen.. 1 Once Daily  2) Cardizem La 240 Mg Tb24 (Diltiazem Hcl Coated Beads) .Marland Kitchen.. 1 Once Daily  3) Flomax 0.4 Mg Cp24 (Tamsulosin Hcl) .Marland Kitchen.. 1 Once Daily   Allergies:  No Known Drug Allergies   Past History:  Past Medical History:  Reviewed history from 10/25/2008 and no changes required.  NEPHROLITHIASIS (ICD-592.0)  COLONIC POLYPS (ICD-211.3)  TOBACCO ABUSE (ICD-305.1)  BENIGN PROSTATIC HYPERTROPHY, HX OF (ICD-V13.8)  PACEMAKER, PERMANENT (ICD-V45.01)  BRADYCARDIA (ICD-427.89)  NEPHROLITHIASIS, HX OF (ICD-V13.01)  COLONIC POLYPS, HX OF (ICD-V12.72)  BENIGN PROSTATIC HYPERTROPHY  (ICD-600.00)  HYPERTENSION (ICD-401.9)   Past Surgical History:  status post pacemaker insertion February 05.  Kidney stone extraction 1981  colonoscopy 2004,2009   Family History:  Reviewed history from 11/24/2007 and no changes required.  father died at 3 of an MI. history of. chronic kidney disease. Mother died 61. History Parkinson's disease  one brother history type 2 diabetes.  two sisters, history of breast cancer (both)   Social History:  Reviewed history from 11/07/2008 and no changes required.  has relocated from [place] approximately 15 months ago  daughter, history of uterine cancer  Former smoker   1. Risk factors, based on past  M,S,F history-  cardiovascular risk factors include a history of hypertension  2.  Physical activities: Remains quite active;  goes to the health club frequently; still works part-time  3.  Depression/mood: No history of depression or mood disorder  4.  Hearing: No deficit  5.  ADL's: Independent in all aspects of daily living  6.  Fall risk: Low  7.  Home safety: No problems identified  8.  Height weight, and visual acuity; height and weight stable no change in visual acuity  9.  Counseling: Heart healthy diet salt restriction regular exercise all encouraged  10. Lab orders based on risk factors: Laboratory profile will be reviewed  11. Referral: Followup cardiology as scheduled requires periodic monitoring of his permanent pacemaker  12. Care plan: Redo colonoscopy   13. Cognitive assessment: Alert and oriented normal affect. No cognitive dysfunction.  Past Medical History  Diagnosis Date  . BENIGN PROSTATIC HYPERTROPHY, HX OF  10/25/2008  . BRADYCARDIA 10/25/2008  . HYPERTENSION 11/21/2006  . 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 PACEMAKER, PERMANENT 10/25/2008  . TOBACCO ABUSE 10/25/2008    Quit 2012    History   Social History  . Marital Status: Divorced    Spouse Name: N/A    Number of  Children: N/A  . Years of Education: N/A   Occupational History  . Not on file.   Social History Main Topics  . Smoking status: Former Smoker    Quit date: 06/15/2010  . Smokeless tobacco: Never Used  . Alcohol Use: Yes     Comment: Wine  . Drug Use: Not on file  . Sexual Activity: Not on file   Other Topics Concern  . Not on file   Social History Narrative   Has relocated from [place] in 2007. 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    Past Surgical History  Procedure Laterality Date  . Pacemaker insertion  2005  . Kidney stone surgery    . Colonoscopy  1610,9604    Family History  Problem Relation Age of Onset  . Breast cancer Sister     Living, 12  . Diabetes type II Brother     Living, 67  . Breast cancer Sister     Living, 79  . Heart attack Father     Died, 72  . Kidney disease Father   . Parkinson's disease Mother     Died, 17  . Diabetes Daughter     Living, 58  . Diabetes Son     Living, 50    No Known Allergies  Current Outpatient Prescriptions on File Prior to Visit  Medication Sig Dispense Refill  . cephALEXin (KEFLEX) 500 MG capsule Take 1 capsule (500 mg total) by mouth 4 (four) times daily.  40 capsule  0   No current facility-administered medications on file prior to visit.    BP 130/74  Pulse 91  Temp(Src) 98.3 F (36.8 C) (Oral)  Resp 20  Ht 5' 10.5" (1.791 m)  Wt 189 lb (85.73 kg)  BMI 26.73 kg/m2  SpO2 97%       Review of Systems  Constitutional: Negative for fever, chills, activity change, appetite change and fatigue.  HENT: Negative for hearing loss, ear pain, congestion, rhinorrhea, sneezing, mouth sores, trouble swallowing, neck pain, neck stiffness, dental problem, voice change, sinus pressure and tinnitus.   Eyes: Negative for photophobia, pain, redness and visual disturbance.  Respiratory: Negative for apnea, cough, choking, chest tightness, shortness of breath and wheezing.   Cardiovascular: Negative  for chest pain, palpitations and leg swelling.  Gastrointestinal: Negative for nausea, vomiting, abdominal pain, diarrhea, constipation, blood in stool, abdominal distention, anal bleeding and rectal pain.  Genitourinary: Negative for dysuria, urgency, frequency, hematuria, flank pain, decreased urine volume, discharge, penile swelling, scrotal swelling, difficulty urinating, genital sores and testicular pain.  Musculoskeletal: Positive for myalgias. Negative for back pain, joint swelling, arthralgias and gait problem.  Skin: Negative for color change, rash and wound.  Neurological: Positive for weakness. Negative for dizziness, tremors, seizures, syncope, facial asymmetry, speech difficulty, light-headedness, numbness and headaches.  Hematological: Negative for adenopathy. Does not bruise/bleed easily.  Psychiatric/Behavioral: Negative for suicidal ideas, hallucinations, behavioral problems, confusion, sleep disturbance, self-injury, dysphoric mood, decreased concentration and agitation. The patient is not nervous/anxious.        Objective:   Physical Exam  Constitutional: He is oriented to person, place, and time. He  appears well-developed and well-nourished.  HENT:  Head: Normocephalic and atraumatic.  Right Ear: External ear normal.  Left Ear: External ear normal.  Nose: Nose normal.  Mouth/Throat: Oropharynx is clear and moist.  Eyes: Conjunctivae and EOM are normal. Pupils are equal, round, and reactive to light. No scleral icterus.  Neck: Normal range of motion. Neck supple. No JVD present. No thyromegaly present.  Cardiovascular: Regular rhythm, normal heart sounds and intact distal pulses.  Exam reveals no gallop and no friction rub.   No murmur heard. Pulmonary/Chest: Effort normal and breath sounds normal. He exhibits no tenderness.  Abdominal: Soft. Bowel sounds are normal. He exhibits no distension and no mass. There is no tenderness.  Genitourinary: Penis normal.  Prostate +2  enlarged smooth and symmetrical  Redundant rectal hemorrhoidal tissue  Musculoskeletal: Normal range of motion. He exhibits no edema and no tenderness.  Lymphadenopathy:    He has no cervical adenopathy.  Neurological: He is alert and oriented to person, place, and time. He has normal reflexes. No cranial nerve deficit. Coordination normal.  See neuro note of 01/13/2013  Evaluation in progress for NMD with weakness and muscle atrophy of intrinsic muscles of the hands as well as thigh atrophy  Skin: Skin is warm and dry. No rash noted.  Psychiatric: He has a normal mood and affect. His behavior is normal.          Assessment & Plan:   Preventative health Hypertension Status post permanent pacemaker insertion History renal colic History colonic polyps  followup colonoscopy BPH Muscle atrophy and weakness- neuro evaluation in progress

## 2013-01-15 ENCOUNTER — Other Ambulatory Visit: Payer: Self-pay | Admitting: Internal Medicine

## 2013-01-15 DIAGNOSIS — R7989 Other specified abnormal findings of blood chemistry: Secondary | ICD-10-CM | POA: Diagnosis not present

## 2013-01-15 DIAGNOSIS — R945 Abnormal results of liver function studies: Secondary | ICD-10-CM

## 2013-01-15 DIAGNOSIS — R944 Abnormal results of kidney function studies: Secondary | ICD-10-CM

## 2013-01-15 LAB — IMMUNOFIXATION ELECTROPHORESIS
IgA: 709 mg/dL — ABNORMAL HIGH (ref 68–379)
IgM, Serum: 123 mg/dL (ref 41–251)
IgM, Serum: 117 mg/dL (ref 41–251)
Total Protein, Serum Electrophoresis: 7.6 g/dL (ref 6.0–8.3)

## 2013-01-15 LAB — PROTEIN ELECTROPHORESIS, SERUM
Albumin ELP: 48.7 % — ABNORMAL LOW (ref 55.8–66.1)
Alpha-1-Globulin: 4.9 % (ref 2.9–4.9)
Alpha-2-Globulin: 13.3 % — ABNORMAL HIGH (ref 7.1–11.8)
Beta 2: 9.9 % — ABNORMAL HIGH (ref 3.2–6.5)
Beta Globulin: 7.4 % — ABNORMAL HIGH (ref 4.7–7.2)

## 2013-01-15 LAB — UIFE/LIGHT CHAINS/TP QN, 24-HR UR
Beta, Urine: DETECTED — AB
Free Lambda Lt Chains,Ur: 2.01 mg/dL — ABNORMAL HIGH (ref 0.02–0.67)
Total Protein, Urine: 33.7 mg/dL

## 2013-01-15 NOTE — Addendum Note (Signed)
Addended by: Rita Ohara R on: 01/15/2013 10:54 AM   Modules accepted: Orders

## 2013-01-19 ENCOUNTER — Ambulatory Visit (INDEPENDENT_AMBULATORY_CARE_PROVIDER_SITE_OTHER): Payer: Medicare Other

## 2013-01-19 DIAGNOSIS — Z23 Encounter for immunization: Secondary | ICD-10-CM | POA: Diagnosis not present

## 2013-01-20 ENCOUNTER — Ambulatory Visit
Admission: RE | Admit: 2013-01-20 | Discharge: 2013-01-20 | Disposition: A | Discharge status: Home / Self Care | Payer: Medicare Other | Source: Ambulatory Visit | Attending: Internal Medicine | Admitting: Internal Medicine

## 2013-01-20 DIAGNOSIS — R945 Abnormal results of liver function studies: Secondary | ICD-10-CM

## 2013-01-20 DIAGNOSIS — R7989 Other specified abnormal findings of blood chemistry: Secondary | ICD-10-CM | POA: Diagnosis not present

## 2013-01-20 DIAGNOSIS — R944 Abnormal results of kidney function studies: Secondary | ICD-10-CM

## 2013-01-21 ENCOUNTER — Encounter: Payer: Medicare Other | Admitting: Neurology

## 2013-01-21 DIAGNOSIS — I129 Hypertensive chronic kidney disease with stage 1 through stage 4 chronic kidney disease, or unspecified chronic kidney disease: Secondary | ICD-10-CM | POA: Diagnosis not present

## 2013-01-21 DIAGNOSIS — I10 Essential (primary) hypertension: Secondary | ICD-10-CM | POA: Diagnosis not present

## 2013-01-21 DIAGNOSIS — N183 Chronic kidney disease, stage 3 unspecified: Secondary | ICD-10-CM | POA: Diagnosis not present

## 2013-01-21 DIAGNOSIS — N179 Acute kidney failure, unspecified: Secondary | ICD-10-CM | POA: Diagnosis not present

## 2013-01-25 ENCOUNTER — Encounter: Payer: Self-pay | Admitting: Neurology

## 2013-01-25 ENCOUNTER — Ambulatory Visit (INDEPENDENT_AMBULATORY_CARE_PROVIDER_SITE_OTHER): Payer: Medicare Other | Admitting: Neurology

## 2013-01-25 DIAGNOSIS — M6281 Muscle weakness (generalized): Secondary | ICD-10-CM

## 2013-01-25 NOTE — Progress Notes (Signed)
See procedure note for EMG results.  Donika K. Patel, DO  

## 2013-01-25 NOTE — Procedures (Signed)
[facility]  [address] [place], [address]  [place], [place] [address] Tel: [phone] Fax:  [phone] Test Date:  01/25/2013  Patient: Jonathon [name] DOB: 1936-04-17 Physician: Nita Sickle, DO  Sex: Male Height: 5\' 11"  Ref Phys:   ID#: [id number]   Technician:    Patient Complaints: 77 year-old man presenting with one year history of bilateral leg weakness and recent history of fasciculations.  NCV & EMG Findings: Extensive electrodiagnostic examination of the left upper and lower extremities with related paraspinal muscles shows: 1.  Median sensory response is mildly prolonged.  The median motor response is moderately prolonged and the motor amplitude is reduced.   2.  Absent ulnar sensory response.  The ulnar motor response is reduced in amplitude and the distal latency is prolonged.  Additionally, there is focal slowing across the elbow.   3.  Absent sural response may be normal for patient's age.  The superficial peroneal response is normal. 4.  F Wave studies indicate that the left tibial F wave has prolonged latency (65.33 ms).  The left ulnar F wave has no response.   5.  H-reflex studies indicate that the left tibial H-reflex has prolonged latency (41.22 ms).  The right tibial H-reflex has prolonged latency (42.56 ms).  A superimposed S1 intraspinal canal lesion cannot be excluded (based on the abnormal of H-reflexes), however this is thought to be less likely in the setting of normal needle electrode examination of S1-innervated muscles proximal to the ankle. 6.  Needle electrode examination shows chronic motor axon loss changes affecting the first dorsal interosseus, abductor digiti minimi, abductor pollicis brevis, and triceps muscles.  There is no active denervation. 7.  Rare small appearing motor units are seen in the gluteus medius only. 8.  Nonspecific changes affecting the intrinsic foot muscles is most likely due to localized trauma from shoe  wearing.  Impression: 1. 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   2. Left ulnar neuropathy at the elbow, moderately severe in degree electrically.  A superimposed C8 motor radiculopathy cannot be excluded. 3. No evidence of diffuse motor axon loss or a generalized myopathy.   ___________________________ Nita Sickle, DO    Nerve Conduction Studies Anti Sensory Summary Table   Site NR Peak (ms) Norm Peak (ms) P-T Amp (V) Norm P-T Amp  Left Median Anti Sensory (2nd Digit)  Wrist    4.2 <3.8 13.9 >10  Left Radial Anti Sensory (Base 1st Digit)  Wrist    2.5 <2.8 12.6 >10  Left Sup Peroneal Anti Sensory (Ant Lat Mall)  12 cm    3.1 <4.6 7.2 >3  Left Sural Anti Sensory (Lat Mall)  Calf NR  <4.6  >3  Left Ulnar Anti Sensory (5th Digit)  Wrist NR  <3.2  >5   Motor Summary Table   Site NR Onset (ms) Norm Onset (ms) O-P Amp (mV) Norm O-P Amp Site1 Site2 Delta-0 (ms) Dist (cm) Vel (m/s) Norm Vel (m/s)  Left Median Motor (Abd Poll Brev)  Wrist    5.6 <4.0 4.5 >5 Elbow Wrist 6.4 35.0 55 >50  Elbow    12.0  3.7         Left Peroneal Motor (Ext Dig Brev)  Ankle    4.8 <6.0 3.2 >2.5 B Fib Ankle 9.6 39.0 41 >40  B Fib    14.4  2.6  Poplt B Fib 2.4 10.0 42 >40  Poplt    16.8  2.4         Left Tibial Motor (Abd Hall Brev)  Ankle    5.9 <6.0 6.3 >4 Knee Ankle 12.9 50.0 39 >40  Knee    18.8  5.2         Left Ulnar Motor (Abd Dig Minimi)  Wrist    3.3 <3.1 6.7 >7 B Elbow Wrist 5.5 26.0 47 >50  B Elbow    8.8  5.4  A Elbow B Elbow 6.0 10.0 17 >50  A Elbow    14.8  4.1          F Wave Studies   NR F-Lat (ms) Lat Norm (ms) L-R F-Lat (ms)  Left Tibial (Mrkrs) (Abd Hallucis)     65.33 <55   Left Ulnar (Mrkrs) (Abd Dig Min)  NR  <33    H Reflex Studies   NR H-Lat (ms) Lat Norm (ms) L-R H-Lat (ms) M-Lat (ms) HLat-MLat (ms)  Left Tibial (Gastroc)     41.22 <35 1.34 41.36 -0.14   Right Tibial (Gastroc)     42.56 <35 1.34 42.31 0.25     EMG   Side Muscle Ins Act Fibs Psw Fasc Number Recrt Dur Dur. Amp Amp. Poly Poly. Comment  Left AntTibialis Nml Nml Nml 1+ Nml Nml Nml Nml Nml Nml Nml Nml N/A  Left Gastroc Nml Nml Nml Nml 1- Mod-V Nml Nml Nml Nml Nml Nml N/A  Left Flex Dig Long Nml Nml Nml Nml Nml Nml Nml Nml Nml Nml Nml Nml N/A  Left Ext Dig Brev Nml Nml Nml Nml 1- Mod-R Few 1+ Few 1+ Nml Nml N/A  Left AbdHallucis Nml Nml Nml Nml 1- Mod-V Few 1+ Few 1+ Nml Nml N/A  Left RectFemoris Nml Nml Nml Nml Nml Nml Nml Nml Nml Nml Nml Nml N/A  Left VastusLat Nml Nml Nml Nml Nml Nml Nml Nml Nml Nml Nml Nml N/A  Left GluteusMed Nml Nml Nml Nml Nml Nml Nml Nml Few 1- Few 1+ E.R.  Left Iliacus Nml Nml Nml Nml Nml Nml Nml Nml Nml Nml Nml Nml N/A  Left Lumbo Parasp Low Nml Nml Nml Nml NE - - - - - - - N/A  Left Cervical Parasp Low Nml Nml Nml Nml Nml Nml Nml Nml Nml Nml Nml Nml N/A  Left 1stDorInt Nml Nml Nml 1+ 3- Rapid Many 1+ Many 1+ Nml Nml N/A  Left ABD Dig Min Nml Nml Nml 1+ 1- Mod-R Some 1+ Few 1+ Nml Nml N/A  Left Abd Poll Brev Nml Nml Nml Nml 1- Mod-R Few 1+ Few 1+ Nml Nml N/A  Left FlexPolLong Nml Nml Nml Nml Nml Nml Nml Nml Nml Nml Nml Nml N/A  Left Ext Indicis Nml Nml Nml Nml Nml Nml Nml Nml Nml Nml Nml Nml N/A  Left PronatorTeres Nml Nml Nml Nml Nml Nml Nml Nml Nml Nml Nml Nml N/A  Left Biceps Nml Nml Nml Nml Nml Nml Nml Nml Nml Nml Nml Nml N/A  Left Deltoid Nml Nml Nml Nml Nml Nml Nml Nml Nml Nml Nml Nml N/A  Left Triceps Nml Nml Nml Nml 1- Mod-R Some 1+ Some 1+ Nml Nml N/A      Waveforms:

## 2013-02-01 ENCOUNTER — Ambulatory Visit (INDEPENDENT_AMBULATORY_CARE_PROVIDER_SITE_OTHER): Payer: Medicare Other | Admitting: Neurology

## 2013-02-01 ENCOUNTER — Other Ambulatory Visit: Payer: Self-pay | Admitting: Internal Medicine

## 2013-02-01 ENCOUNTER — Encounter: Payer: Self-pay | Admitting: Neurology

## 2013-02-01 VITALS — BP 140/84 | HR 88 | Temp 98.0°F | Resp 20 | Wt 192.0 lb

## 2013-02-01 DIAGNOSIS — R29898 Other symptoms and signs involving the musculoskeletal system: Secondary | ICD-10-CM

## 2013-02-01 DIAGNOSIS — D472 Monoclonal gammopathy: Secondary | ICD-10-CM

## 2013-02-01 NOTE — Progress Notes (Signed)
[facility]  Follow-up Visit   Date: 02/01/2013   Jonathon [name] MRN: [id number] DOB: Mar 27, 1936   Interim History: Jonathon [name] is a 77 y.o. year-old right-handed African American male with history of BPH, hypertension, bradycardia s/p pacemaker, and previous tobacco use presenting for follow-up of bilateral leg weakness.  The patient was accompanied to the clinic by self.  He was last seen in the office on 01/13/2013.  Since then, his symptoms have remained stable.  He continues to have weakness of his proximal legs, worse on the left.  He continues to lift weights in hopes of regaining muscle strength and bulk.  Denies any post-exercise soreness, myalgias, or tea-colored urine.  EMG was performed by myself on 9/15 which did not show any myopathy or radiculopathy of the left lower extremity.  There is left median neuropathy at the wrist and left ulnar neuropathy at the elbow.  He denies any numbness or tinging of the left hand, but does report having difficulty with opening jars.    History of present illness: Patient was in his usual state of health until 2013. He has always been physical active and would go to the gym daily (walking up 10 miles/day). During the spring of 2013, he developed relatively subacute onset of left > right hamstring soreness without an inciting event. Pain initially started in the butt area and radiated down. There is mild soreness of the calf bilaterally. It is exacerbated by prolonged sitting, such as when driving. There is associated proximal leg weakness and loss of muscle mass. He tried doing 50lb squats at the gym to strengthen his quadriceps over the past month but has not noticed any change in his symptoms. Functionally, he is still able to complete all his usual activities, but has became much more cautious when climbing stairs.     Medications:  Current Outpatient Prescriptions on File Prior to Visit  Medication Sig Dispense Refill  .  diltiazem (CARDIZEM LA) 240 MG 24 hr tablet Take 1 tablet (240 mg total) by mouth daily.  90 tablet  3  . tamsulosin (FLOMAX) 0.4 MG CAPS capsule Take 1 capsule (0.4 mg total) by mouth daily.  90 capsule  3  . irbesartan (AVAPRO) 300 MG tablet Take 1 tablet (300 mg total) by mouth daily.  90 tablet  3   No current facility-administered medications on file prior to visit.    Allergies: No Known Allergies   Review of Systems:  CONSTITUTIONAL: No fevers, chills, night sweats, or weight loss.   EYES: No visual changes or eye pain ENT: No hearing changes.  No history of nose bleeds.   RESPIRATORY: No cough, wheezing and shortness of breath.   CARDIOVASCULAR: Negative for chest pain, and palpitations.   GI: Negative for abdominal discomfort, blood in stools or black stools.  No recent change in bowel habits.   GU:  No history of incontinence.   MUSCLOSKELETAL: No history of joint pain or swelling.  No myalgias.   SKIN: Negative for lesions, rash, and itching.   HEMATOLOGY/ONCOLOGY: Negative for prolonged bleeding, bruising easily, and swollen nodes.  ENDOCRINE: Negative for cold or heat intolerance, polydipsia or goiter.   PSYCH:  No depression or anxiety symptoms.   NEURO: As Above.   Vital Signs:  BP 140/84  Pulse 88  Temp(Src) 98 F (36.7 C)  Resp 20  Wt 192 lb (87.091 kg)  BMI 27.15 kg/m2   Neurological Exam: MENTAL STATUS including orientation to time, place, person,  recent and remote memory, attention span and concentration, language, and fund of knowledge is normal.  Speech is not dysarthric.  CRANIAL NERVES: II:  No visual field defects.   III-IV-VI: Pupils equal round and reactive to light.  Normal conjugate, extra-ocular eye movements in all directions of gaze. No ptosis V:  Normal facial sensation.  VII:  Normal facial symmetry and movements.   VIII:  Normal hearing and vestibular function.   IX-X:  Normal palatal movement.   XI:  Normal shoulder shrug and head  rotation.   XII:  Normal tongue strength and range of motion, no deviation or fasciculation.  MOTOR:  Bilateral moderate ADM, FDI > ABP and mild L quadriceps atrophy.  No fasciculations or abnormal movements.  No pronator drift.  Tone is normal.    Right Upper Extremity:    Left Upper Extremity:    Deltoid  5/5   Deltoid  5/5   Biceps  5/5   Biceps  5/5   Triceps  5/5   Triceps  5/5   Wrist extensors  5/5   Wrist extensors  5/5   Wrist flexors  5/5   Wrist flexors  5/5   Finger extensors  5/5   Finger extensors  5/5   Finger flexors  5/5   Finger flexors  5/5   Dorsal interossei  3+/5   Dorsal interossei  3+/5   Abductor pollicis  4/5   Abductor pollicis  4/5   Tone (Ashworth scale)  0  Tone (Ashworth scale)  0   Right Lower Extremity:    Left Lower Extremity:    Hip flexors  5/5   Hip flexors  4+/5   Hip extensors  5/5   Hip extensors  5/5   Knee flexors  5/5   Knee flexors  5/5   Knee extensors  5/5   Knee extensors  5/5   Dorsiflexors  5/5   Dorsiflexors  5/5   Plantarflexors  5/5   Plantarflexors  5/5   Toe extensors  5/5   Toe extensors  5/5   Toe flexors  5/5   Toe flexors  5/5   Tone (Ashworth scale)  0  Tone (Ashworth scale)  0   MSRs:  Right                                                                 Left patellar 2+  patellar 2+    COORDINATION/GAIT: Normal finger-to- nose-finger and heel-to-shin.  Intact rapid alternating movements bilaterally.  Able to rise from a chair without using arms.  Gait narrow based and stable. Tandem and stressed gait intact.   Data: Component     Latest Ref Rng 01/13/2013  Aldolase     <=8.1 U/L 7.9  TSH     0.35 - 5.50 uIU/mL 0.39  Copper     70 - 175 mcg/dL 97  Ceruloplasmin     20 - 60 mg/dL 30  CK Total     7 - 161 U/L 55   EMG 01/25/2013:   1. 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  2. Left ulnar neuropathy at  the elbow, moderately severe in degree electrically. A  superimposed C8 motor radiculopathy cannot be excluded. 3. No evidence of diffuse motor axon loss or a generalized myopathy.   IMPRESSION: Mr. Kemmerling is a delightful 77 year-old gentleman presenting for evaluation of leg weakness and loss of muscle bulk.  His neurological exam discloses atrophy and weakness of the intrinsic hand muscles (FDI/ABP > ADM) and quadriceps.  Labs including CK, aldolase, TSH, copper, and ceruloplasmin is within normal limits.  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  His electrodiagnostic testing of the left side shows median neuropathy at the wrist and left ulnar neuropathy.  There was no evidence of myopathy.  I am not certain that his paraproteinemia is contributing to his muscle weakness because I did not find evidence of neuropothy involving his left leg. Amyloid, on the contrary, can cause myopathy and neuropathy, so I do feel that it needs to be work-up and I will refer him to Hematology.  Unable to obtain MRI of the lumbar spine due to PPM, so will obtain CT instead.   PLAN/RECOMMENDATIONS:  1.  CT lumbar spine 2.  Consult to hematology for IgA monoclonal gammopathy 3.  Return to clinic in 6-weeks  The duration of this appointment visit was 30 minutes of face-to-face time with the patient.  Greater than 50% of this time was spent in counseling, explanation of diagnosis, planning of further management, and coordination of care.   Thank you for allowing me to participate in patient's care.  If I can answer any additional questions, I would be pleased to do so.    Sincerely,    Donika K. Allena Katz, DO

## 2013-02-01 NOTE — Patient Instructions (Addendum)
Hematology will call to schedule your appointment.  Please let us know if you haven't heard from them in a few days.

## 2013-02-02 ENCOUNTER — Other Ambulatory Visit: Payer: Self-pay

## 2013-02-02 DIAGNOSIS — R29898 Other symptoms and signs involving the musculoskeletal system: Secondary | ICD-10-CM

## 2013-02-03 ENCOUNTER — Encounter: Payer: Self-pay | Admitting: *Deleted

## 2013-03-05 DIAGNOSIS — N182 Chronic kidney disease, stage 2 (mild): Secondary | ICD-10-CM | POA: Diagnosis not present

## 2013-03-05 DIAGNOSIS — N179 Acute kidney failure, unspecified: Secondary | ICD-10-CM | POA: Diagnosis not present

## 2013-03-05 DIAGNOSIS — N183 Chronic kidney disease, stage 3 unspecified: Secondary | ICD-10-CM | POA: Diagnosis not present

## 2013-03-05 DIAGNOSIS — I129 Hypertensive chronic kidney disease with stage 1 through stage 4 chronic kidney disease, or unspecified chronic kidney disease: Secondary | ICD-10-CM | POA: Diagnosis not present

## 2013-03-10 ENCOUNTER — Telehealth: Payer: Self-pay | Admitting: Hematology & Oncology

## 2013-03-10 NOTE — Telephone Encounter (Signed)
Left message moved 11-5 to 11-12

## 2013-03-17 ENCOUNTER — Ambulatory Visit: Payer: Medicare Other | Admitting: Hematology & Oncology

## 2013-03-17 ENCOUNTER — Other Ambulatory Visit: Payer: Medicare Other | Admitting: Lab

## 2013-03-17 ENCOUNTER — Ambulatory Visit: Payer: Medicare Other

## 2013-03-23 ENCOUNTER — Telehealth: Payer: Self-pay | Admitting: Hematology & Oncology

## 2013-03-23 NOTE — Telephone Encounter (Signed)
Left vm w NEW PATIENT today to remind them of their appointment with Dr. Ennever. Also, advised them to bring all meds and insurance information. ° °

## 2013-03-24 ENCOUNTER — Telehealth: Payer: Self-pay | Admitting: Hematology & Oncology

## 2013-03-24 ENCOUNTER — Ambulatory Visit: Payer: Medicare Other

## 2013-03-24 ENCOUNTER — Ambulatory Visit (HOSPITAL_BASED_OUTPATIENT_CLINIC_OR_DEPARTMENT_OTHER)
Admission: RE | Admit: 2013-03-24 | Discharge: 2013-03-24 | Disposition: A | Discharge status: Home / Self Care | Payer: Medicare Other | Source: Ambulatory Visit | Attending: Hematology & Oncology | Admitting: Hematology & Oncology

## 2013-03-24 ENCOUNTER — Ambulatory Visit (HOSPITAL_BASED_OUTPATIENT_CLINIC_OR_DEPARTMENT_OTHER): Payer: Medicare Other | Admitting: Hematology & Oncology

## 2013-03-24 ENCOUNTER — Ambulatory Visit (HOSPITAL_BASED_OUTPATIENT_CLINIC_OR_DEPARTMENT_OTHER): Payer: Medicare Other | Admitting: Lab

## 2013-03-24 VITALS — BP 135/81 | HR 83 | Temp 98.2°F | Resp 18 | Ht 70.0 in | Wt 189.0 lb

## 2013-03-24 DIAGNOSIS — M47812 Spondylosis without myelopathy or radiculopathy, cervical region: Secondary | ICD-10-CM | POA: Diagnosis not present

## 2013-03-24 DIAGNOSIS — D649 Anemia, unspecified: Secondary | ICD-10-CM

## 2013-03-24 DIAGNOSIS — M51379 Other intervertebral disc degeneration, lumbosacral region without mention of lumbar back pain or lower extremity pain: Secondary | ICD-10-CM | POA: Insufficient documentation

## 2013-03-24 DIAGNOSIS — D472 Monoclonal gammopathy: Secondary | ICD-10-CM | POA: Diagnosis not present

## 2013-03-24 DIAGNOSIS — M5137 Other intervertebral disc degeneration, lumbosacral region: Secondary | ICD-10-CM | POA: Insufficient documentation

## 2013-03-24 LAB — CBC WITH DIFFERENTIAL (CANCER CENTER ONLY)
BASO#: 0.1 10*3/uL (ref 0.0–0.2)
BASO%: 1.4 % (ref 0.0–2.0)
EOS%: 2.8 % (ref 0.0–7.0)
HCT: 34 % — ABNORMAL LOW (ref 38.7–49.9)
LYMPH#: 1.4 10*3/uL (ref 0.9–3.3)
LYMPH%: 38.6 % (ref 14.0–48.0)
MCH: 25.4 pg — ABNORMAL LOW (ref 28.0–33.4)
MCHC: 33.2 g/dL (ref 32.0–35.9)
MCV: 76 fL — ABNORMAL LOW (ref 82–98)
MONO%: 17.9 % — ABNORMAL HIGH (ref 0.0–13.0)
NEUT%: 39.3 % — ABNORMAL LOW (ref 40.0–80.0)
RDW: 15.7 % (ref 11.1–15.7)

## 2013-03-24 LAB — CHCC SATELLITE - SMEAR

## 2013-03-24 LAB — IRON AND TIBC
%SAT: 7 % — ABNORMAL LOW (ref 20–55)
Iron: 33 ug/dL — ABNORMAL LOW (ref 42–165)
UIBC: 428 ug/dL — ABNORMAL HIGH (ref 125–400)

## 2013-03-24 LAB — FERRITIN: Ferritin: 97 ng/mL (ref 22–322)

## 2013-03-24 NOTE — Progress Notes (Signed)
This office note has been dictated.

## 2013-03-24 NOTE — Telephone Encounter (Signed)
Pt has instructions for 11-26 BMBX, to be NPO after midnight, needs a driver and to check in at radiology. He is aware he may have to wait over an hour between lab and MD on 12-8. Dr. Myna Hidalgo aware BMBX is 11-26 not next week.

## 2013-03-25 ENCOUNTER — Telehealth: Payer: Self-pay | Admitting: *Deleted

## 2013-03-25 NOTE — Telephone Encounter (Signed)
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

## 2013-03-25 NOTE — Telephone Encounter (Signed)
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 Thanks. Jonathon [name] ------

## 2013-03-26 LAB — HEMOGLOBINOPATHY EVALUATION
Hemoglobin Other: 0 %
Hgb A2 Quant: 2.7 % (ref 2.2–3.2)
Hgb A: 97.3 % (ref 96.8–97.8)
Hgb S Quant: 0 %

## 2013-03-29 LAB — IGG, IGA, IGM
IgG (Immunoglobin G), Serum: 1350 mg/dL (ref 650–1600)
IgM, Serum: 121 mg/dL (ref 41–251)

## 2013-03-29 LAB — COMPREHENSIVE METABOLIC PANEL
ALT: 196 U/L — ABNORMAL HIGH (ref 0–53)
AST: 261 U/L — ABNORMAL HIGH (ref 0–37)
Alkaline Phosphatase: 110 U/L (ref 39–117)
BUN: 15 mg/dL (ref 6–23)
Calcium: 9.3 mg/dL (ref 8.4–10.5)
Chloride: 101 mEq/L (ref 96–112)
Creatinine, Ser: 1.01 mg/dL (ref 0.50–1.35)
Total Bilirubin: 0.8 mg/dL (ref 0.3–1.2)

## 2013-03-29 LAB — PROTEIN ELECTROPHORESIS, SERUM, WITH REFLEX
Albumin ELP: 47.9 % — ABNORMAL LOW (ref 55.8–66.1)
Alpha-1-Globulin: 6.6 % — ABNORMAL HIGH (ref 2.9–4.9)
Alpha-2-Globulin: 11.1 % (ref 7.1–11.8)
Beta 2: 9.3 % — ABNORMAL HIGH (ref 3.2–6.5)
Gamma Globulin: 16.7 % (ref 11.1–18.8)

## 2013-03-29 LAB — KAPPA/LAMBDA LIGHT CHAINS
Kappa free light chain: 3.49 mg/dL — ABNORMAL HIGH (ref 0.33–1.94)
Kappa:Lambda Ratio: 0.96 (ref 0.26–1.65)

## 2013-03-29 LAB — BETA 2 MICROGLOBULIN, SERUM: Beta-2 Microglobulin: 3.01 mg/L — ABNORMAL HIGH (ref 1.01–1.73)

## 2013-03-29 LAB — LACTATE DEHYDROGENASE: LDH: 427 U/L — ABNORMAL HIGH (ref 94–250)

## 2013-03-31 LAB — UIFE/LIGHT CHAINS/TP QN, 24-HR UR
Albumin, U: DETECTED
Alpha 2, Urine: DETECTED — AB
Beta, Urine: DETECTED — AB
Free Kappa Lt Chains,Ur: 3.31 mg/dL — ABNORMAL HIGH (ref 0.14–2.42)
Free Kappa/Lambda Ratio: 8.71 ratio (ref 2.04–10.37)
Free Lt Chn Excr Rate: 99.3 mg/d
Total Protein, Urine-Ur/day: 126 mg/d (ref 10–140)
Total Protein, Urine: 4.2 mg/dL

## 2013-04-01 ENCOUNTER — Other Ambulatory Visit: Payer: Self-pay | Admitting: Radiology

## 2013-04-05 ENCOUNTER — Encounter: Payer: Self-pay | Admitting: Internal Medicine

## 2013-04-05 ENCOUNTER — Ambulatory Visit (INDEPENDENT_AMBULATORY_CARE_PROVIDER_SITE_OTHER): Payer: Medicare Other | Admitting: *Deleted

## 2013-04-05 DIAGNOSIS — I495 Sick sinus syndrome: Secondary | ICD-10-CM | POA: Diagnosis not present

## 2013-04-05 LAB — MDC_IDC_ENUM_SESS_TYPE_REMOTE
Battery Impedance: 111 Ohm
Battery Remaining Longevity: 157 mo
Battery Voltage: 2.78 V
Brady Statistic AP VP Percent: 0 %
Brady Statistic AS VP Percent: 7 %
Brady Statistic AS VS Percent: 93 %
Lead Channel Impedance Value: 442 Ohm
Lead Channel Impedance Value: 669 Ohm
Lead Channel Pacing Threshold Pulse Width: 0.4 ms
Lead Channel Sensing Intrinsic Amplitude: 2.8 mV
Lead Channel Setting Pacing Amplitude: 2 V
Lead Channel Setting Pacing Amplitude: 2.5 V
Lead Channel Setting Pacing Pulse Width: 0.4 ms
Lead Channel Setting Sensing Sensitivity: 2 mV

## 2013-04-07 ENCOUNTER — Encounter (HOSPITAL_COMMUNITY): Payer: Self-pay

## 2013-04-07 ENCOUNTER — Ambulatory Visit (HOSPITAL_COMMUNITY)
Admission: RE | Admit: 2013-04-07 | Discharge: 2013-04-07 | Disposition: A | Discharge status: Home / Self Care | Payer: Medicare Other | Source: Ambulatory Visit | Attending: Hematology & Oncology | Admitting: Hematology & Oncology

## 2013-04-07 DIAGNOSIS — Z950 Presence of cardiac pacemaker: Secondary | ICD-10-CM | POA: Insufficient documentation

## 2013-04-07 DIAGNOSIS — I10 Essential (primary) hypertension: Secondary | ICD-10-CM | POA: Insufficient documentation

## 2013-04-07 DIAGNOSIS — D72819 Decreased white blood cell count, unspecified: Secondary | ICD-10-CM | POA: Insufficient documentation

## 2013-04-07 DIAGNOSIS — D472 Monoclonal gammopathy: Secondary | ICD-10-CM | POA: Diagnosis not present

## 2013-04-07 DIAGNOSIS — R252 Cramp and spasm: Secondary | ICD-10-CM | POA: Insufficient documentation

## 2013-04-07 DIAGNOSIS — D509 Iron deficiency anemia, unspecified: Secondary | ICD-10-CM | POA: Insufficient documentation

## 2013-04-07 DIAGNOSIS — D72822 Plasmacytosis: Secondary | ICD-10-CM | POA: Insufficient documentation

## 2013-04-07 LAB — CBC
HCT: 32.4 % — ABNORMAL LOW (ref 39.0–52.0)
Hemoglobin: 11 g/dL — ABNORMAL LOW (ref 13.0–17.0)
MCH: 25.7 pg — ABNORMAL LOW (ref 26.0–34.0)
MCV: 75.7 fL — ABNORMAL LOW (ref 78.0–100.0)
Platelets: 170 10*3/uL (ref 150–400)
RBC: 4.28 MIL/uL (ref 4.22–5.81)
RDW: 16.6 % — ABNORMAL HIGH (ref 11.5–15.5)
WBC: 3.6 10*3/uL — ABNORMAL LOW (ref 4.0–10.5)

## 2013-04-07 MED ORDER — HYDROCODONE-ACETAMINOPHEN 5-325 MG PO TABS
1.0000 | ORAL_TABLET | ORAL | Status: DC | PRN
  Filled 2013-04-07: qty 2

## 2013-04-07 MED ORDER — SODIUM CHLORIDE 0.9 % IV SOLN
INTRAVENOUS | Status: DC
  Administered 2013-04-07: 07:00:00 via INTRAVENOUS

## 2013-04-07 MED ORDER — MIDAZOLAM HCL 2 MG/2ML IJ SOLN
INTRAMUSCULAR | Status: AC | PRN
  Administered 2013-04-07 (×2): 1 mg via INTRAVENOUS

## 2013-04-07 MED ORDER — FENTANYL CITRATE 0.05 MG/ML IJ SOLN
INTRAMUSCULAR | Status: AC
  Filled 2013-04-07: qty 4

## 2013-04-07 MED ORDER — MIDAZOLAM HCL 2 MG/2ML IJ SOLN
INTRAMUSCULAR | Status: AC
  Filled 2013-04-07: qty 4

## 2013-04-07 MED ORDER — FENTANYL CITRATE 0.05 MG/ML IJ SOLN
INTRAMUSCULAR | Status: AC | PRN
  Administered 2013-04-07 (×2): 50 ug via INTRAVENOUS

## 2013-04-07 NOTE — Procedures (Signed)
CT-guided  R iliac bone marrow aspiration and core biopsy No complication No blood loss. See complete dictation in [facility] PACS  

## 2013-04-07 NOTE — H&P (Signed)
Jonathon [name] is an 77 y.o. male.   Chief Complaint: scheduled for bone marrow biopsy Pt developed leg cramps approx 6 months ago. Physical therapy x several weeks without improvement. Referred to neurology - Dr Allena Katz- determined monoclonal gammopathy of unknown significance Referred to hematologist- Dr Myna Hidalgo Request for BM bx HPI: BPH; HTN; pacemaker  Past Medical History  Diagnosis Date  . BENIGN PROSTATIC HYPERTROPHY, HX OF 10/25/2008  . BRADYCARDIA 10/25/2008  . HYPERTENSION 11/21/2006  . 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 PACEMAKER, PERMANENT 10/25/2008  . TOBACCO ABUSE 10/25/2008    Quit 2012    Past Surgical History  Procedure Laterality Date  . Pacemaker insertion  2005  . Kidney stone surgery    . Colonoscopy  [id number]    Family History  Problem Relation Age of Onset  . Breast cancer Sister     Living, 45  . Diabetes type II Brother     Living, 67  . Breast cancer Sister     Living, 72  . Heart attack Father     Died, 60  . Kidney disease Father   . Parkinson's disease Mother     Died, 62  . Diabetes Daughter     Living, 80  . Diabetes Son     Living, 59   Social History:  reports that he quit smoking about 2 years ago. He has never used smokeless tobacco. He reports that he drinks alcohol. His drug history is not on file.  Allergies: No Known Allergies   (Not in [name] hospital admission)  Results for orders placed during the hospital encounter of 04/07/13 (from the past 48 hour(s))  APTT     Status: None   Collection Time    04/07/13  7:20 AM      Result Value Range   aPTT 26  24 - 37 seconds  CBC     Status: Abnormal   Collection Time    04/07/13  7:20 AM      Result Value Range   WBC 3.6 (*) 4.0 - 10.5 K/uL   RBC 4.28  4.22 - 5.81 MIL/uL   Hemoglobin 11.0 (*) 13.0 - 17.0 g/dL   HCT 54.0 (*) 98.1 - 19.1 %   MCV 75.7 (*) 78.0 - 100.0 fL   MCH 25.7 (*) 26.0 - 34.0 pg   MCHC 34.0  30.0 - 36.0 g/dL   RDW 47.8 (*) 29.5  - 15.5 %   Platelets 170  150 - 400 K/uL  PROTIME-INR     Status: None   Collection Time    04/07/13  7:20 AM      Result Value Range   Prothrombin Time 13.4  11.6 - 15.2 seconds   INR 1.04  0.00 - 1.49   No results found.  Review of Systems  Constitutional: Positive for malaise/fatigue. Negative for fever.  Respiratory: Negative for cough and shortness of breath.   Cardiovascular: Negative for chest pain.  Gastrointestinal: Negative for nausea, vomiting and abdominal pain.  Musculoskeletal:       Leg pain  Neurological: Positive for weakness. Negative for dizziness and headaches.    Blood pressure 154/91, pulse 93, temperature 98.3 F (36.8 C), temperature source Oral, resp. rate 18, SpO2 100.00%. Physical Exam  Constitutional: He is oriented to person, place, and time. He appears well-nourished.  Cardiovascular: Normal rate, regular rhythm and normal heart sounds.   No murmur heard. Respiratory: Effort normal and breath sounds normal. He has no  wheezes.  GI: Soft. Bowel sounds are normal. There is no tenderness.  Musculoskeletal: Normal range of motion.  Neurological: He is alert and oriented to person, place, and time.  Skin: Skin is warm and dry.  Psychiatric: He has [name] normal mood and affect. His behavior is normal. Judgment and thought content normal.     Assessment/Plan B Leg cramps; MGUS Request from Dr Myna Hidalgo for [facility] bx Pt aware of procedure benefits and risks and agreeable to proceed Consent signed and in chart  Jonathon [name] 04/07/2013, 8:10 AM

## 2013-04-19 ENCOUNTER — Other Ambulatory Visit (HOSPITAL_BASED_OUTPATIENT_CLINIC_OR_DEPARTMENT_OTHER): Payer: Medicare Other | Admitting: Lab

## 2013-04-19 ENCOUNTER — Encounter: Payer: Self-pay | Admitting: Hematology & Oncology

## 2013-04-19 ENCOUNTER — Ambulatory Visit (HOSPITAL_BASED_OUTPATIENT_CLINIC_OR_DEPARTMENT_OTHER): Payer: Medicare Other | Admitting: Hematology & Oncology

## 2013-04-19 VITALS — BP 144/75 | HR 88 | Temp 98.5°F | Resp 18 | Ht 70.0 in | Wt 191.0 lb

## 2013-04-19 DIAGNOSIS — D509 Iron deficiency anemia, unspecified: Secondary | ICD-10-CM

## 2013-04-19 DIAGNOSIS — D649 Anemia, unspecified: Secondary | ICD-10-CM

## 2013-04-19 DIAGNOSIS — D472 Monoclonal gammopathy: Secondary | ICD-10-CM

## 2013-04-19 HISTORY — DX: Iron deficiency anemia, unspecified: D50.9

## 2013-04-19 LAB — CBC WITH DIFFERENTIAL (CANCER CENTER ONLY)
BASO#: 0 10*3/uL (ref 0.0–0.2)
EOS%: 1.2 % (ref 0.0–7.0)
Eosinophils Absolute: 0.1 10*3/uL (ref 0.0–0.5)
HCT: 33.6 % — ABNORMAL LOW (ref 38.7–49.9)
HGB: 10.9 g/dL — ABNORMAL LOW (ref 13.0–17.1)
MCH: 24.7 pg — ABNORMAL LOW (ref 28.0–33.4)
MCHC: 32.4 g/dL (ref 32.0–35.9)
MONO%: 15.4 % — ABNORMAL HIGH (ref 0.0–13.0)
NEUT#: 2.1 10*3/uL (ref 1.5–6.5)
NEUT%: 49.4 % (ref 40.0–80.0)
Platelets: 139 10*3/uL — ABNORMAL LOW (ref 145–400)
RBC: 4.42 10*6/uL (ref 4.20–5.70)

## 2013-04-19 NOTE — Progress Notes (Signed)
CC:   Jonathon Savers, MD  DIAGNOSIS:  Iron-deficiency anemia.  CURRENT THERAPY:  The patient to receive IV iron on 04/23/2013.  INTERIM HISTORY:  Jonathon [name] comes in for a second office visit.  We initially saw him back in November.  At that point in time, I went ahead and did a bone marrow on him.  I was not sure as to what was going on with the anemia.  His studies certainly were not all that conclusive. He had an LDH that was elevated at 427.  We did do a beta-2 microglobulin which was 3.01.  He had negative monoclonal spike in his serum.  His IgA level was up at 685.  We did a 24-hour urine on him.  There was no Bence Jones protein in his urine.  We did do iron studies.  These were positive, he is iron-deficient.  His iron saturation was only 7%.  Ferritin was 97, which is mostly an acute phase reactant.  We did the bone marrow test on him.  This was done on November 26.  The bone marrow report ([id number]) showed a normocellular marrow with mild plasmacytosis.  He only had 5% to 10% plasma cells. Immunohistochemistry did not really show any monoclonal proteins in the bone marrow.  Congo red stains were negative for amyloid.  Again, he had markedly reduced iron stores.  I believe that Jonathon [name] is going to need to have a colonoscopy.  He has seen, I think, [facility] before.  We will see about making a referral back to them.  He was last seen by Dr. Sheryn Bison.  His colonoscopy was unremarkable.  He definitely needs to have a colonoscopy and an upper endoscopy.  He still has a little bit of fatigue.  He is still trying to work out.  He does not have any type of "cravings."  He has had no weight loss.  He has had no change in bowel or bladder habits.  PHYSICAL EXAMINATION:  General:  This is a well-developed, well- nourished, African American gentleman in no obvious distress.  Vital Signs:  Temperature of 98.5, pulse 88, respiratory rate 18, blood pressure  144/75, weight is 191 pounds.  Head and Neck:  Normocephalic, atraumatic skull.  There are no ocular or oral lesions.  He has no palpable cervical or supraclavicular lymph nodes.  Lungs:  Clear bilaterally.  Cardiac:  Regular rate and rhythm with normal S1, S2. There are no murmurs, rubs, or bruits.  Abdomen:  Soft.  He has good bowel sounds.  There is no fluid wave.  There is no guarding or rebound tenderness.  He has no palpable hepatosplenomegaly.  Back:  No tenderness over the spine, ribs, or hips.  Extremities:  No clubbing, cyanosis, or edema.  Neurologic:  No focal neurological deficits.  LABORATORY STUDIES:  White cell count is 4.2, hemoglobin 10.9, hematocrit 33.6, platelet count is 139.  MCV is 76.  Peripheral smear shows some microcytic red blood cells.  There is some slight anisocytosis.  There are no nucleated red blood cells.  I see no teardrop cells.  He has no schistocytes or spherocytes.  Target cells are not present.  White cells appear normal in morphology and maturation.  There are no immature myeloid or lymphoid forms.  He has no hypersegmented polys.  There are no atypical lymphocytes.  Platelets are adequate in number and size.  IMPRESSION:  Jonathon [name] is a very nice 77 year old African American gentleman.  He has  iron-deficiency anemia.  So far, all of our studies have not shown any bone marrow disorder.  I did do a hemoglobin electrophoresis on him.  He had normal hemoglobin electrophoresis.  I will send off an alpha-thalassemia study on him.  We will see what the iron does for him.  Again, he needs to have an upper endoscopy and lower endoscopy.  He did have an ultrasound of his abdomen back in September.  This showed hepatic steatosis.  Again, I do not see any hematologic malignancy with Jonathon [name].  I do not see any plasma cell disorder.  I spent a good 45 minutes with Jonathon [name] today.  I reviewed all his lab work with him.  I went over his bone  marrow test with him.  I explained to him what the possibilities were going to be, and that he needed to be endoscoped.  Again, we will make a referral back to Dr. Amador Cunas and have him refer to [facility] Pollack, GI.  I will see Jonathon [name] back in about 6 weeks or so.    ______________________________ Josph Macho, M.D. PRE/MEDQ  D:  04/19/2013  T:  04/19/2013  Job:  [id number]

## 2013-04-19 NOTE — Progress Notes (Signed)
This office note has been dictated.

## 2013-04-20 ENCOUNTER — Other Ambulatory Visit: Payer: Self-pay | Admitting: Internal Medicine

## 2013-04-20 DIAGNOSIS — Z86010 Personal history of colonic polyps: Secondary | ICD-10-CM

## 2013-04-20 LAB — CHROMOSOME ANALYSIS, BONE MARROW

## 2013-04-20 NOTE — Progress Notes (Signed)
CC:   Jonathon Savers, MD Nita Sickle, MD [facility]  DIAGNOSES: 1. 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 2. Neuropathy of the legs.  HISTORY OF PRESENT ILLNESS:  Jonathon [name] is a very nice 77 year old African American gentleman.  He is originally from [place] the [place].  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  He has been having some issues for the past year and a half I think with some pain and weakness in his legs.  He has been evaluated at length by Neurology.  He has had EMG studies.  He was found to have I guess neuropathy in the wrists.  I am not sure if there is any kind of neuropathy in the legs.  Of course, they did the typical battery of neuropathy studies. According to the note, there was an IgA monoclonal gammopathy.  However, I do not see any evidence of a monoclonal gammopathy.  He had SPEP done, which did not show a monoclonal spike.  The IgA level was elevated.  I do not find any studies that shows an immunoelectrophoresis, confirmation of an IgA gammopathy.  He was then referred to the [facility] for an evaluation.  He was in the [employer] for 3 years.  He was stationed in [place].  Again, he has had a battery of studies done for all this neuropathy issues.  He had a ceruloplasmin, copper and aldolase, and these were all normal.  Again, the immunoglobulin levels showed an IgA of 709.  He had normal IgG and IgM studies.  He is supposed to have a CT scan from the chart.  I do not see where one was done unless it was done outside the [facility].  He will have a 24-hour urine done in our office.  He does have some renal insufficiency.  Back in January, BUN and creatinine were 47 and 3.1.  A month later, they are back to normal at 19 and 1.06.  I am not sure how to explain that.  His LFTs were slightly elevated.  He did have an ultrasound of the abdomen and  pelvis.  He does have some hepatic steatosis.  Everything else looks okay.  Kidneys looked fine with no renal enlargement.  There is no splenomegaly.  No obvious lymphadenopathy was noted.  He has not had any problem with weight loss or weight gain.  There has been no swallowing difficulties.  He has had no joint issues.  There has been no rashes.  He has not noted any kind of leg swelling.  There has been no change in bowel or bladder habits.  Last colonoscopy was I think 5 years ago.  As far as he knows, there is no history of sickle cell in the family.  He has had no headache.  There has been no double vision or blurred vision.  PAST MEDICAL HISTORY:  Remarkable for: 1. Hypertension. 2. BPH.  PAST SURGICAL HISTORY:  Jonathon [name] had a pacemaker put in 10 years ago for a "heart arrhythmia".  ALLERGIES:  None.  MEDICATIONS:  Cardizem LA 240 mg p.o. daily, Flomax 0.4 mg p.o. daily, Avapro 300 mg p.o. daily.  SOCIAL HISTORY:  Remarkable for past tobacco use.  He smoked for about 45 years.  He said he only smoked a couple cigarettes a day to help him sleep.  He does have a history of alcohol use.  He says that he probably drinks 4-6 beers  a day.  He used to drink heavy liquor, but he stopped this many years ago.  There is no obvious hepatitis exposure.  He has no obvious occupational risk.  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  FAMILY HISTORY:  Remarkable for diabetes.  There is no history of anemia in the family.  REVIEW OF SYSTEMS:  As stated in the history of present illness.  No additional findings noted on a 12-system review.  PHYSICAL EXAMINATION:  General:  This is a well-developed, well- nourished, African American male in no obvious distress.  Vital signs: Temperature 98.2, pulse 83, respiratory rate 18, blood pressure 135/81. Weight is 189 pounds.  Head and neck:  Show normocephalic, atraumatic skull.  There are no ocular  or oral lesions.  There are no palpable cervical or supraclavicular lymph nodes.  There is no thickening of the tongue.  Lungs:  Clear to percussion and auscultation bilaterally. Cardiac:  Regular rate and rhythm with a normal S1 and S2.  There are no murmurs, rubs, or bruits.  Abdomen:  Soft.  He has good bowel sounds. There is no fluid wave.  There is no palpable abdominal mass.  There is no palpable hepatosplenomegaly.  Axillary:  No bilateral axillary adenopathy.  Extremities:  No clubbing, cyanosis, or edema.  He has pretty good strength in his upper and lower extremities.  He has good range of motion of his joints.  I see no joint swelling, erythema, or warmth.  Skin:  No rashes, ecchymoses, or petechia.  Neurological: Shows no focal neurological deficits.  LABORATORY STUDIES:  White cell count is 3.6, hemoglobin 11.3, hematocrit 34, platelet count 163.  MCV is 76.  Peripheral smear shows a slightly microcytic population of red blood cells.  There may be some slight anisocytosis.  There may be a few target cells.  I see no rouleaux formation.  There is no schistocytes or spherocytes.  White cells are minimally decreased in number.  He has a slight increase in monocytes.  They appear mature in shape and form.  He has no hypersegmented polys.  I see no immature myeloid forms.  He has no atypical lymphocytes.  Platelets are adequate in number and size.  IMPRESSION:  Jonathon [name] is a 77 year old African American gentleman with neuropathy.  Again, he did not have a monoclonal spike on the SPEP that was done by the neurologist.  I will repeat this.  We will see if there is an underlying monoclonal gammopathy.  If so, this had to be minimal at best since they cannot even measure a monoclonal protein in his serum.  I think that we are going to end up having to do a bone marrow test on him anyway.  I gave him a 24-hour urine container to do a urine electrophoresis to make sure that he  does not have any kind of kappa or lambda disease.  I see that he is microcytic on his CBC.  I will send off a hemoglobin electrophoresis to rule out a hemoglobinopathy.  I am also sending off iron studies to make sure that he is not iron deficient.  I wonder about his alcohol use that he has.  Looks like he is going to develop some hepatic steatosis.  One would have to think that this is from an alcohol consumption.  I wonder if the alcohol consumption might be contributing to this neuropathy.  We will get a bone survey on him  also.  We will see if there are any lytic lesions on his bones that might suggest a plasma cell disorder.  I spent a good hour with Jonathon [name].  I explained to him what we are trying to find out.  I told him that I thought it would be highly unusual for him to have a monoclonal gammopathy without a positive M- spike.  However, we will still have to rule out light chain disease.  I do not see anything that would suggest a POEMS syndrome.  I will get Jonathon [name] back in another month.  By then, his workup would have been completed and we can see if anything is present that we need to deal with.  Probably, I would have the bone marrow test done by Radiology next week.    ______________________________ Josph Macho, M.D. PRE/MEDQ  D:  03/24/2013  T:  04/03/2013  Job:  [id number]

## 2013-04-21 ENCOUNTER — Encounter: Payer: Self-pay | Admitting: Gastroenterology

## 2013-04-21 ENCOUNTER — Encounter (HOSPITAL_COMMUNITY): Payer: Self-pay

## 2013-04-22 ENCOUNTER — Encounter: Payer: Self-pay | Admitting: Hematology & Oncology

## 2013-04-23 ENCOUNTER — Ambulatory Visit (HOSPITAL_BASED_OUTPATIENT_CLINIC_OR_DEPARTMENT_OTHER): Payer: Medicare Other

## 2013-04-23 ENCOUNTER — Other Ambulatory Visit: Payer: Self-pay | Admitting: *Deleted

## 2013-04-23 ENCOUNTER — Other Ambulatory Visit (HOSPITAL_BASED_OUTPATIENT_CLINIC_OR_DEPARTMENT_OTHER): Payer: Medicare Other | Admitting: Lab

## 2013-04-23 VITALS — BP 152/88 | HR 89 | Temp 98.2°F | Resp 20

## 2013-04-23 DIAGNOSIS — D509 Iron deficiency anemia, unspecified: Secondary | ICD-10-CM | POA: Diagnosis not present

## 2013-04-23 MED ORDER — SODIUM CHLORIDE 0.9 % IV SOLN
Freq: Once | INTRAVENOUS | Status: AC
  Administered 2013-04-23: 10:00:00 via INTRAVENOUS

## 2013-04-23 MED ORDER — SODIUM CHLORIDE 0.9 % IV SOLN
1020.0000 mg | Freq: Once | INTRAVENOUS | Status: AC
  Administered 2013-04-23: 1020 mg via INTRAVENOUS
  Filled 2013-04-23: qty 34

## 2013-04-23 MED ORDER — HEPARIN SOD (PORK) LOCK FLUSH 100 UNIT/ML IV SOLN
250.0000 [IU] | Freq: Once | INTRAVENOUS | Status: DC | PRN
  Filled 2013-04-23: qty 5

## 2013-04-23 NOTE — Patient Instructions (Signed)

## 2013-04-27 ENCOUNTER — Encounter: Payer: Self-pay | Admitting: *Deleted

## 2013-05-04 ENCOUNTER — Ambulatory Visit (AMBULATORY_SURGERY_CENTER): Payer: Self-pay | Admitting: *Deleted

## 2013-05-04 VITALS — Ht 70.0 in | Wt 193.2 lb

## 2013-05-04 DIAGNOSIS — Z860100 Personal history of colon polyps, unspecified: Secondary | ICD-10-CM

## 2013-05-04 MED ORDER — MOVIPREP 100 G PO SOLR: ORAL | Status: DC

## 2013-05-04 NOTE — Progress Notes (Signed)
No allergies to eggs or soy. No problems with anesthesia.  

## 2013-05-19 ENCOUNTER — Encounter: Payer: Medicare Other | Admitting: Gastroenterology

## 2013-05-21 ENCOUNTER — Encounter: Payer: Medicare Other | Admitting: Gastroenterology

## 2013-05-24 ENCOUNTER — Ambulatory Visit (AMBULATORY_SURGERY_CENTER): Payer: Medicare Other | Admitting: Gastroenterology

## 2013-05-24 ENCOUNTER — Encounter: Payer: Self-pay | Admitting: Gastroenterology

## 2013-05-24 VITALS — BP 132/85 | HR 70 | Temp 98.8°F | Resp 22 | Ht 70.0 in | Wt 193.0 lb

## 2013-05-24 DIAGNOSIS — I10 Essential (primary) hypertension: Secondary | ICD-10-CM | POA: Diagnosis not present

## 2013-05-24 DIAGNOSIS — D126 Benign neoplasm of colon, unspecified: Secondary | ICD-10-CM

## 2013-05-24 DIAGNOSIS — Z860100 Personal history of colon polyps, unspecified: Secondary | ICD-10-CM

## 2013-05-24 DIAGNOSIS — D509 Iron deficiency anemia, unspecified: Secondary | ICD-10-CM | POA: Diagnosis not present

## 2013-05-24 DIAGNOSIS — K644 Residual hemorrhoidal skin tags: Secondary | ICD-10-CM

## 2013-05-24 DIAGNOSIS — K620 Anal polyp: Secondary | ICD-10-CM | POA: Diagnosis not present

## 2013-05-24 DIAGNOSIS — N400 Enlarged prostate without lower urinary tract symptoms: Secondary | ICD-10-CM | POA: Diagnosis not present

## 2013-05-24 DIAGNOSIS — K5730 Diverticulosis of large intestine without perforation or abscess without bleeding: Secondary | ICD-10-CM | POA: Diagnosis not present

## 2013-05-24 DIAGNOSIS — K621 Rectal polyp: Secondary | ICD-10-CM | POA: Diagnosis not present

## 2013-05-24 MED ORDER — SODIUM CHLORIDE 0.9 % IV SOLN: 500.0000 mL | INTRAVENOUS | Status: DC

## 2013-05-24 MED ORDER — HYDROCORTISONE ACE-PRAMOXINE 1-1 % RE CREA: TOPICAL_CREAM | RECTAL | Status: DC

## 2013-05-24 NOTE — Patient Instructions (Signed)

## 2013-05-24 NOTE — Progress Notes (Signed)
Called to room to assist during endoscopic procedure.  Patient ID and intended procedure confirmed with present staff. Received instructions for my participation in the procedure from the performing physician.  

## 2013-05-24 NOTE — Progress Notes (Signed)
Stable to RR 

## 2013-05-24 NOTE — Op Note (Addendum)
[place]  [address]. [place], [address]   COLONOSCOPY PROCEDURE REPORT  PATIENT: Jonathon [name], Jonathon [name]  MR#: [id number] BIRTHDATE: Jul 06, 1935 , 77  yrs. old GENDER: Male ENDOSCOPIST: Sable Feil, MD, [facility] REFERRED BY: PROCEDURE DATE:  05/24/2013 PROCEDURE:   Colonoscopy with snare polypectomy First Screening Colonoscopy - Avg.  risk and is 50 yrs.  old or older - No.      History of Adenoma - Now for follow-up colonoscopy & has been > or = to 3 yrs.  Yes hx of adenoma.  Has been 3 or more years since last colonoscopy.  Polyps Removed Today? Yes. ASA CLASS:   Class II INDICATIONS:Colorectal cancer screening. MEDICATIONS: propofol (Diprivan) 350mg  IV  DESCRIPTION OF PROCEDURE:   After the risks benefits and alternatives of the procedure were thoroughly explained, informed consent was obtained.  A digital rectal exam revealed external hemorrhoids.   The LB [id number]  endoscope was introduced through the anus and advanced to the cecum. No adverse events experienced.   The quality of the prep was excellent, using MoviPrep  The instrument was then slowly withdrawn as the colon was fully examined.      COLON FINDINGS: Mild diverticulosis was noted in the sigmoid colon. The colon was otherwise normal.  There was no diverticulosis, inflammation, polyps or cancers unless previously stated. Perianal polyp hot snare excised. Retroflexed views revealed external hemorrhoids. The time to cecum=2 minutes 33 seconds.  Withdrawal time=8 minutes 50 seconds.  The scope was withdrawn and the procedure completed.Fleshy redundant perianal tissue c/w large tags and probable recurrebt fissures and ?? hemorrhoid surgery.Polyp is orobable not an adenoma.Otherwise no polyps noted. COMPLICATIONS: There were no complications.  ENDOSCOPIC IMPRESSION: 1.   Mild diverticulosis was noted in the sigmoid colon 2 .Anal polyp excised 3 .external tags and  hemorrhoids  RECOMMENDATIONS: 1.  Await biopsy results 2.  Repeat colonoscopy in 5 years if polyp adenomatous; otherwise 10 years 3.  Continue current medications 4. Analpram cream prn,he may need surgical hemorrhoidectomy depending on clinical course   eSigned:  Sable Feil, MD, [facility] 05/24/2013 9:04 AM Revised: 05/24/2013 9:04 AM  cc: Marletta Lor, MD   PATIENT NAME:  Jonathon [name], Jonathon [name] MR#: [id number]

## 2013-05-25 ENCOUNTER — Encounter (HOSPITAL_COMMUNITY): Payer: Self-pay | Admitting: Emergency Medicine

## 2013-05-25 ENCOUNTER — Emergency Department (INDEPENDENT_AMBULATORY_CARE_PROVIDER_SITE_OTHER)
Admission: EM | Admit: 2013-05-25 | Discharge: 2013-05-25 | Disposition: A | Discharge status: Home / Self Care | Payer: Medicare Other | Source: Home / Self Care | Attending: Family Medicine | Admitting: Family Medicine

## 2013-05-25 ENCOUNTER — Telehealth: Payer: Self-pay

## 2013-05-25 DIAGNOSIS — M7022 Olecranon bursitis, left elbow: Secondary | ICD-10-CM

## 2013-05-25 DIAGNOSIS — M7020 Olecranon bursitis, unspecified elbow: Secondary | ICD-10-CM | POA: Diagnosis not present

## 2013-05-25 DIAGNOSIS — M25442 Effusion, left hand: Secondary | ICD-10-CM

## 2013-05-25 DIAGNOSIS — M25449 Effusion, unspecified hand: Secondary | ICD-10-CM | POA: Diagnosis not present

## 2013-05-25 MED ORDER — NAPROXEN 500 MG PO TABS: 500.0000 mg | ORAL_TABLET | Freq: Two times a day (BID) | ORAL | Status: DC

## 2013-05-25 NOTE — ED Provider Notes (Signed)
CSN: [id number]     Arrival date & time 05/25/13  0802 History   First MD Initiated Contact with Patient 05/25/13 [phone]     Chief Complaint  Patient presents with  . Joint Swelling   (Consider location/radiation/quality/duration/timing/severity/associated sxs/prior Treatment) HPI Comments: 78 year old male presents complaining of Elbow pain and swelling for a week, getting worse overnight last night.  Also has noted some new hand swelling overnight.  Pain in elbow only, worse with any flexion or extension, no pain proximally or distally to this.  He has a history of olecranon bursitis that has been treated with drainage, always with good results. No trauma to the arm.   Past Medical History  Diagnosis Date  . BENIGN PROSTATIC HYPERTROPHY, HX OF 10/25/2008  . BRADYCARDIA 10/25/2008  . HYPERTENSION 11/21/2006  . 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 PACEMAKER, PERMANENT 10/25/2008  . TOBACCO ABUSE 10/25/2008    Quit 2012  . Iron deficiency anemia, unspecified 04/19/2013   Past Surgical History  Procedure Laterality Date  . Pacemaker insertion  2005  . Kidney stone surgery    . Colonoscopy  [id number]  . Skin graft Right 1962    wrist   Family History  Problem Relation Age of Onset  . Breast cancer Sister     Living, 85  . Diabetes type II Brother     Living, 67  . Breast cancer Sister     Living, 71  . Heart attack Father     Died, 44  . Kidney disease Father   . Parkinson's disease Mother     Died, 85  . Diabetes Daughter     Living, 24  . Diabetes Son     Living, 36  . Colon cancer Neg Hx   . Rectal cancer Neg Hx   . Stomach cancer Neg Hx    History  Substance Use Topics  . Smoking status: Former Smoker    Quit date: 06/15/2010  . Smokeless tobacco: Never Used  . Alcohol Use: 6.6 oz/week    6 Glasses of wine, 5 Cans of beer per week    Review of Systems  Constitutional: Negative for fever, chills and fatigue.  HENT: Negative for sore  throat.   Eyes: Negative for visual disturbance.  Respiratory: Negative for cough and shortness of breath.   Cardiovascular: Negative for chest pain, palpitations and leg swelling.  Gastrointestinal: Negative for nausea, vomiting, abdominal pain, diarrhea and constipation.  Genitourinary: Negative for dysuria, urgency, frequency and hematuria.  Musculoskeletal: Positive for arthralgias and joint swelling. Negative for myalgias, neck pain and neck stiffness.       Left hand swelling   Skin: Negative for rash.  Neurological: Negative for dizziness, weakness and light-headedness.    Allergies  Review of patient's allergies indicates no known allergies.  Home Medications   Current Outpatient Rx  Name  Route  Sig  Dispense  Refill  . cholecalciferol (VITAMIN D) 400 UNITS TABS tablet   Oral   Take 400 Units by mouth.         . Cyanocobalamin (VITAMIN B 12 PO)   Oral   Take 1,000 mg by mouth daily.         Marland Kitchen diltiazem (CARDIZEM LA) 240 MG 24 hr tablet   Oral   Take 1 tablet (240 mg total) by mouth daily.   90 tablet   3   . Garlic [id number] MG CAPS   Oral   Take 1,000 mg by mouth  every morning.          . naproxen (NAPROSYN) 500 MG tablet   Oral   Take 1 tablet (500 mg total) by mouth 2 (two) times daily.   60 tablet   0   . Niacin (VITAMIN B-3 PO)   Oral   Take by mouth every morning.         . Omega-3 Fatty Acids (FISH OIL) 1000 MG CAPS   Oral   Take by mouth every morning.         . pramoxine-hydrocortisone (PROCTOCREAM-HC) 1-1 % rectal cream      Use at bedtime as needed   30 g   1   . tamsulosin (FLOMAX) 0.4 MG CAPS capsule   Oral   Take 1 capsule (0.4 mg total) by mouth daily.   90 capsule   3    BP 155/86  Pulse 102  Temp(Src) 98.1 F (36.7 C) (Oral)  Resp 16  SpO2 98% Physical Exam  Nursing note and vitals reviewed. Constitutional: He is oriented to person, place, and time. He appears well-developed and well-nourished. No distress.  HENT:   Head: Normocephalic.  Cardiovascular:  Pulses:      Radial pulses are 2+ on the right side, and 2+ on the left side.  Pulmonary/Chest: Effort normal. No respiratory distress.  Musculoskeletal:       Left elbow: He exhibits effusion (olecranon bursitis ). Tenderness found. Olecranon process tenderness noted.       Left hand: He exhibits swelling (nonpitting). He exhibits normal range of motion, no tenderness, no bony tenderness, normal two-point discrimination and normal capillary refill. Normal sensation noted. Normal strength noted.  Neurological: He is alert and oriented to person, place, and time. Coordination normal.  Skin: Skin is warm and dry. No rash noted. He is not diaphoretic.  Psychiatric: He has a normal mood and affect. Judgment normal.    ED Course  Procedures (including critical care time) Labs Review Labs Reviewed - No data to display Imaging Review No results found.  Skin over the bursa initially cleansed with alcohol wipe. Anesthetized with 1 mL of 2% lidocaine with epinephrine in a wheel under the skin and 1 mL into the bursa itself. The skin was then cleansed with a Betadine swab and allowed to dry. An 18-gauge needle was entered into the bursa perpendicular to the skin. Approximately 15 mL of serous sanguinous fluid was aspirated from the bursa. A Band-Aid was placed and an Ace wrap was then placed around that.  MDM   1. Olecranon bursitis of left elbow   2. Swelling of joint, hand, left    I have advised him to use a neoprene compression sleeve over his elbow for approximately one month. He has been instructed to followup in 2 days, either here or with his primary care physician, if the hand swelling has not resolved. He has the swelling, but the swelling area is not painful and nonpitting, I believe this is a consequence of the bursitis. However, if this persists or worsen he may need to be evaluated for any underlying cellulitis or upper extremity DVT    Meds  ordered this encounter  Medications  . naproxen (NAPROSYN) 500 MG tablet    Sig: Take 1 tablet (500 mg total) by mouth 2 (two) times daily.    Dispense:  60 tablet    Refill:  0    Order Specific Question:  Supervising Provider    Answer:  Ihor Gully D [phone]  Liam Graham, PA-C 05/25/13 [phone]

## 2013-05-25 NOTE — Telephone Encounter (Signed)
Left message to call [facility] if needed following procedure

## 2013-05-25 NOTE — ED Notes (Signed)
Ace  Wrap  Applied by  zack pa

## 2013-05-25 NOTE — Discharge Instructions (Signed)
Olecranon Bursitis  with Rehab A bursa is a fluid filled sac that is located between soft tissues (ligaments, tendons, skin) and bones. The purpose of a bursa is to allow the soft tissue to function smoothly, without friction. The olecrenon bursa is located between the back of the elbow (olecrenon) and the skin. Olecrenon bursitis involves inflammation of this bursa, resulting in pain. SYMPTOMS   Pain, tenderness, swelling, warmth, or redness over the back of the elbow.  Reduced range of motion of the affected elbow.  Sometimes, severe pain with movement of the affected elbow.  Crackling sound (crepitation) when the bursa is moved or touched.  Often, painless swelling of the bursa.  Fever (when infected). CAUSES  Olecranon bursitis is often caused by direct hit (trauma) to the elbow. Less commonly, it is due to overuse and/or strenuous exercise that the elbow is not used to. RISK INCREASES WITH:  Sports that require bending or landing on the elbow (football, volleyball).  Vigorous or repetitive athletic training, or sudden increase or change in activity level (weekend warriors).  Failure to warm up properly before activity.  Poor exercise technique.  Playing on artificial turf. PREVENTION  Avoid injuries and the overuse of muscles whenever possible.  Warm up and stretch properly before activity.  Allow for adequate recovery between workouts.  Maintain physical fitness:  Strength, flexibility, and endurance.  Cardiovascular fitness.  Learn and use proper technique.  Wear properly fitted and padded protective equipment. PROGNOSIS  If treated properly, olecranon bursitis is usually curable within 2 weeks.  RELATED COMPLICATIONS   Longer healing time, if not properly treated or if not given enough time to heal.  Recurring symptoms that result in a chronic problem.  Joint stiffness with permanent limitation of the affected joint's movement.  Infection of the  bursa.  Chronic inflammation or scarring of the bursa. TREATMENT Treatment first involves the use of ice and medicine, to reduce pain and inflammation. The use of strengthening and stretching exercises may help reduce pain with activity. These exercises may be performed at home or with a therapist. Elbow pads may be advised, to protect the bursa. If symptoms persist, despite non-surgical treatment, a procedure to withdraw fluid from the bursa may be advised. This procedure may be accompanied with an injection of corticosteroids, to reduce inflammation. Sometimes, surgery is needed to remove the bursa. MEDICATION  If pain medicine is needed, nonsteroidal anti-inflammatory medicines (aspirin and ibuprofen), or other minor pain relievers (acetaminophen), are often advised.  Do not take pain medicine for 7 days before surgery.  Prescription pain relievers may be given, if your caregiver thinks they are needed. Use only as directed and only as much as you need.  Corticosteroid injections may be given by your caregiver. These injections should be reserved for the most serious cases, because they may only be given a certain number of times. HEAT AND COLD  Cold treatment (icing) should be applied for 10 to 15 minutes every 2 to 3 hours for inflammation and pain, and immediately after activity that aggravates your symptoms. Use ice packs or an ice massage.  Heat treatment may be used before performing stretching and strengthening activities prescribed by your caregiver, physical therapist, or athletic trainer. Use a heat pack or a warm water soak. SEEK IMMEDIATE MEDICAL CARE IF:   Symptoms get worse or do not improve in 2 weeks, despite treatment.  Signs of infection develop, including fever of 102 F (38.9 C), increased pain, redness, warmth, or pus draining from  the bursa.  New, unexplained symptoms develop. (Drugs used in treatment may produce side effects.) EXERCISES  RANGE OF MOTION (ROM) AND  STRETCHING EXERCISES - Olecranon Bursitis These exercises may help you when beginning to rehabilitate your injury. Your symptoms may resolve with or without further involvement from your physician, physical therapist or athletic trainer. While completing these exercises, remember:   Restoring tissue flexibility helps normal motion to return to the joints. This allows healthier, less painful movement and activity.  An effective stretch should be held for at least 30 seconds.  A stretch should never be painful. You should only feel a gentle lengthening or release in the stretched tissue. RANGE OF MOTION  Elbow Flexion, Supine  Lie on your back. Extend your right / left arm into the air, bracing it with your opposite hand. Allow your right / left arm to relax.  Let your elbow bend, allowing your hand to fall slowly toward your chest.  You should feel a gentle stretch along the back of your upper arm and elbow. Your physician, physical therapist or athletic trainer may ask you to hold a __________ hand weight to increase the intensity of this stretch.  Hold for __________ seconds. Slowly return your right / left arm to the upright position. Repeat __________ times. Complete this exercise __________ times per day. STRETCH  Elbow Flexors  Lie on a firm bed or countertop on your back. Be sure that you are in a comfortable position which will allow you to relax your arm muscles.  Place a folded towel under your right / left upper arm, so that your elbow and shoulder are at the same height. Extend your arm; your elbow should not rest on the bed or towel  Allow the weight of your hand to straighten your elbow. Keep your arm and chest muscles relaxed. Your caregiver may ask you to increase the intensity of your stretch by adding a small wrist or hand weight.  Hold for __________ seconds. You should feel a stretch on the inside of your elbow. Slowly return to the starting position. Repeat __________  times. Complete this exercise __________ times per day. STRENGTHENING EXERCISES - Olecranon Bursitis These exercises will help you regain your strength. They may resolve your symptoms with or without further involvement from your physician, physical therapist or athletic trainer. While completing these exercises, remember:   Muscles can gain both the endurance and the strength needed for everyday activities through controlled exercises.  Complete these exercises as instructed by your physician, physical therapist or athletic trainer. Increase the resistance and repetitions only as guided by your caregiver.  You may experience muscle soreness or fatigue, but the pain or discomfort you are trying to eliminate should never worsen during these exercises. If this pain does worsen, stop and make certain you are following the directions exactly. If the pain is still present after adjustments, discontinue the exercise until you can discuss the trouble with your caregiver. STRENGTH - Elbow Extensors, Isometric  Stand or sit upright on a firm surface. Place your right / left arm so that your palm faces your stomach, and it is at the height of your waist.  Place your opposite hand on the underside of your forearm. Gently push up as your right / left arm resists. Push as hard as you can with both arms without causing any pain or movement at your right / left elbow. Hold this stationary position for __________ seconds.  Gradually release the tension in both arms. Allow  your muscles to relax completely before repeating. Repeat __________ times. Complete this exercise __________ times per day. STRENGTH - Elbow Flexors, Isometric  Stand or sit upright on a firm surface. Place your right / left arm so that your hand is palm-up and at the height of your waist.  Place your opposite hand on top of your forearm. Gently push down as your right / left arm resists. Push as hard as you can with both arms without causing  any pain or movement at your right / left elbow. Hold this stationary position for __________ seconds.  Gradually release the tension in both arms. Allow your muscles to relax completely before repeating. Repeat __________ times. Complete this exercise __________ times per day. STRENGTH  Elbow Flexors, Supinated  With good posture, stand or sit on a firm chair without armrests. Allow your right / left arm to rest at your side with your palm facing forward.  Holding a __________ weight, or gripping a rubber exercise band or tubing,  bring your hand toward your shoulder.  Allow your muscles to control the resistance as your hand returns to your side. Repeat __________ times. Complete this exercise __________ times per day.  STRENGTH  Elbow Flexors, Neutral  With good posture, stand or sit on a firm chair without armrests. Allow your right / left arm to rest at your side with your thumb facing forward.  Holding a __________weight, or gripping a rubber exercise band or tubing,  bring your hand toward your shoulder.  Allow your muscles to control the resistance as your hand returns to your side. Repeat __________ times. Complete this exercise __________ times per day.  STRENGTH  Elbow Extensors  Lie on your back. Extend your right / left elbow into the air, pointing it toward the ceiling. Brace your arm with your opposite hand.*  Holding a __________ weight in your hand, slowly straighten your right / left elbow.  Allow your muscles to control the weight as your hand returns to its starting position. Repeat __________ times. Complete this exercise __________ times per day. *You may also stand with your elbow overhead and pointed toward the ceiling, supported by your opposite hand. STRENGTH - Elbow Extensors, Dynamic  With good posture, stand, or sit on a firm chair without armrests. Keeping your upper arms at your side, bring both hands up to your right / left shoulder while gripping a  rubber exercise band or tubing. Your right / left hand should be just below the other hand.  Straighten your right / left elbow. Hold for __________ seconds.  Allow your muscles to control the rubber exercise band, as your hand returns to your shoulder. Repeat __________ times. Complete this exercise __________ times per day. Document Released: 04/29/2005 Document Revised: 07/22/2011 Document Reviewed: 08/11/2008 [facility] Patient Information 2014 [place], [place].

## 2013-05-25 NOTE — ED Notes (Signed)
Pt  Reports  Pain  /  Swelling of l  Elbow     He  denys  Any   specefic  Injury           He  Reports  The  Symptoms  For  About  4  Days             He  Is  Sitting upright on  Exam table  Speaking in  Complete  sentances  And  Is  Awake  As  Well  As  Alert  And  Oriented

## 2013-05-27 ENCOUNTER — Encounter: Payer: Self-pay | Admitting: Gastroenterology

## 2013-05-27 NOTE — ED Provider Notes (Signed)
Medical screening examination/treatment/procedure(s) were performed by resident physician or non-physician practitioner and as supervising physician I was immediately available for consultation/collaboration.   Pauline Good MD.   Billy Fischer, MD 05/27/13 [phone]

## 2013-06-02 ENCOUNTER — Ambulatory Visit (HOSPITAL_BASED_OUTPATIENT_CLINIC_OR_DEPARTMENT_OTHER): Payer: Medicare Other | Admitting: Hematology & Oncology

## 2013-06-02 ENCOUNTER — Encounter: Payer: Self-pay | Admitting: Hematology & Oncology

## 2013-06-02 ENCOUNTER — Other Ambulatory Visit (HOSPITAL_BASED_OUTPATIENT_CLINIC_OR_DEPARTMENT_OTHER): Payer: Medicare Other | Admitting: Lab

## 2013-06-02 VITALS — BP 142/79 | HR 94 | Temp 98.0°F | Resp 18 | Ht 71.0 in | Wt 187.0 lb

## 2013-06-02 DIAGNOSIS — M7020 Olecranon bursitis, unspecified elbow: Secondary | ICD-10-CM | POA: Diagnosis not present

## 2013-06-02 DIAGNOSIS — R5383 Other fatigue: Secondary | ICD-10-CM

## 2013-06-02 DIAGNOSIS — D509 Iron deficiency anemia, unspecified: Secondary | ICD-10-CM

## 2013-06-02 DIAGNOSIS — D472 Monoclonal gammopathy: Secondary | ICD-10-CM

## 2013-06-02 DIAGNOSIS — R5381 Other malaise: Secondary | ICD-10-CM | POA: Diagnosis not present

## 2013-06-02 LAB — IRON AND TIBC CHCC
%SAT: 57 % — ABNORMAL HIGH (ref 20–55)
Iron: 163 ug/dL (ref 42–163)
TIBC: 285 ug/dL (ref 202–409)
UIBC: 122 ug/dL (ref 117–376)

## 2013-06-02 LAB — CBC WITH DIFFERENTIAL (CANCER CENTER ONLY)
BASO#: 0 10*3/uL (ref 0.0–0.2)
BASO%: 0.8 % (ref 0.0–2.0)
EOS%: 0.8 % (ref 0.0–7.0)
Eosinophils Absolute: 0 10*3/uL (ref 0.0–0.5)
HEMATOCRIT: 39.7 % (ref 38.7–49.9)
HEMOGLOBIN: 13.2 g/dL (ref 13.0–17.1)
LYMPH#: 1.6 10*3/uL (ref 0.9–3.3)
LYMPH%: 30.9 % (ref 14.0–48.0)
MCH: 26.5 pg — AB (ref 28.0–33.4)
MCHC: 33.2 g/dL (ref 32.0–35.9)
MCV: 80 fL — ABNORMAL LOW (ref 82–98)
MONO#: 0.9 10*3/uL (ref 0.1–0.9)
MONO%: 16.7 % — AB (ref 0.0–13.0)
NEUT#: 2.6 10*3/uL (ref 1.5–6.5)
NEUT%: 50.8 % (ref 40.0–80.0)
Platelets: 256 10*3/uL (ref 145–400)
RBC: 4.98 10*6/uL (ref 4.20–5.70)
RDW: 22.2 % — ABNORMAL HIGH (ref 11.1–15.7)
WBC: 5.1 10*3/uL (ref 4.0–10.0)

## 2013-06-02 LAB — TESTOSTERONE: Testosterone: 201 ng/dL — ABNORMAL LOW (ref 300–890)

## 2013-06-02 LAB — CHCC SATELLITE - SMEAR

## 2013-06-02 LAB — FERRITIN CHCC: Ferritin: 2064 ng/ml — ABNORMAL HIGH (ref 22–316)

## 2013-06-02 NOTE — Progress Notes (Signed)
This office note has been dictated.

## 2013-06-03 NOTE — Progress Notes (Signed)
DIAGNOSIS:  Iron-deficiency anemia.  CURRENT THERAPY:  IV iron as indicated.  INTERIM HISTORY:  Mr. Hoogendoorn comes in for followup.  The iron that we gave him back in December worked very nicely.  He said he started to feel better.  He feels much better.  He still has some fatigue, but certainly not what it used to be.  We still are watching him closely.  He was having some issues with his legs.  His legs do not feel as bad right now.  He has been tested for amyloidosis.  He did have a bone marrow biopsy done in November.  This did not show any monoclonal proteins.  He had a 24-hour urine done.  I do not think there was a monoclonal protein noted.  He did have serum protein electrophoresis.  No monoclonal spike was noted in his serum.  His iron studies when we first saw him which showed a ferritin of 97 with an iron saturation of only 7%.  Again, he feels better right now.  I have not yet checked his testosterone levels.  This might be something that could be helpful.  He has had no nausea or vomiting.  He states he still does not look great.  He has had no problems with bowels or bladder.  He did have a colonoscopy a week or so ago.  This was negative for any obvious mass or source of blood loss.  PHYSICAL EXAMINATION:  General:  This is a well-developed, well- nourished black gentleman in no obvious distress.  Vital Signs: Temperature of 98, pulse 94, respiratory rate 18, blood pressure 142/79, weight is 187.  Head and Neck:  Normocephalic, atraumatic skull.  There are no ocular or oral lesions.  There are no palpable cervical or supraclavicular lymph nodes.  Lungs:  Clear bilaterally.  He has no rales, wheezes, or rhonchi.  Cardiac:  Regular rate and rhythm with a normal S1, S2.  There are no murmurs, rubs, or bruits.  Abdomen:  Soft. He has good bowel sounds.  There is no fluid wave.  There is no palpable abdominal mass.  There is no palpable hepatosplenomegaly.  Back:  No tenderness over the spine, ribs, or hips.  Extremities:  No clubbing, cyanosis, or edema.  He has good strength in his legs.  LABORATORY STUDIES:  Show a white cell count of 5.1, hemoglobin 13.2, hematocrit 39.7, platelet count is 256.  MCV is 80.  IMPRESSION:  Mr. Sheek is a very nice 78 year old African American gentleman.  When we first saw him, he came in with fatigue.  He was having leg weakness.  We, so far, have not found any obvious bone marrow malignancy.  He then was found have iron deficiency.  He did have a bone marrow test done.  He had a colonoscopy done.  For now, we will plan to get him back to see [place] in another 6 weeks.    ______________________________ Volanda Napoleon, M.D. PRE/MEDQ  D:  06/02/2013  T:  06/03/2013  Job:  [id number]

## 2013-06-04 LAB — HEMOGLOBINOPATHY EVALUATION
HEMOGLOBIN OTHER: 0 %
HGB A: 97.4 % (ref 96.8–97.8)
Hgb A2 Quant: 2.6 % (ref 2.2–3.2)
Hgb F Quant: 0 % (ref 0.0–2.0)
Hgb S Quant: 0 %

## 2013-06-04 LAB — RETICULOCYTES (CHCC)
ABS Retic: 35.8 10*3/uL (ref 19.0–186.0)
RBC.: 5.11 MIL/uL (ref 4.22–5.81)
RETIC CT PCT: 0.7 % (ref 0.4–2.3)

## 2013-06-09 ENCOUNTER — Telehealth: Payer: Self-pay | Admitting: Nurse Practitioner

## 2013-06-09 NOTE — Telephone Encounter (Addendum)
Message copied by Jimmy Footman on Wed Jun 09, 2013  1:46 PM ------      Message from: Volanda Napoleon      Created: Sun Jun 06, 2013  2:17 PM       Call -His testosterone level is low.  Dose he want any treatment for this (ie gel to rub into his skin???)  Pete ------LVM on pt's personal machine and instructed her to give [place] a call back with any further questions or concerns.

## 2013-06-11 ENCOUNTER — Other Ambulatory Visit: Payer: Self-pay | Admitting: Nurse Practitioner

## 2013-06-11 DIAGNOSIS — N400 Enlarged prostate without lower urinary tract symptoms: Secondary | ICD-10-CM

## 2013-06-11 DIAGNOSIS — D509 Iron deficiency anemia, unspecified: Secondary | ICD-10-CM

## 2013-06-11 MED ORDER — TESTOSTERONE 50 MG/5GM (1%) TD GEL: 5.0000 g | Freq: Every day | TRANSDERMAL | Status: DC

## 2013-06-15 ENCOUNTER — Ambulatory Visit (INDEPENDENT_AMBULATORY_CARE_PROVIDER_SITE_OTHER): Payer: Medicare Other | Admitting: Internal Medicine

## 2013-06-15 ENCOUNTER — Encounter: Payer: Self-pay | Admitting: Internal Medicine

## 2013-06-15 VITALS — BP 140/88 | HR 89 | Temp 98.4°F | Resp 20 | Ht 71.0 in | Wt 190.0 lb

## 2013-06-15 DIAGNOSIS — I10 Essential (primary) hypertension: Secondary | ICD-10-CM | POA: Diagnosis not present

## 2013-06-15 DIAGNOSIS — D509 Iron deficiency anemia, unspecified: Secondary | ICD-10-CM | POA: Diagnosis not present

## 2013-06-15 DIAGNOSIS — L089 Local infection of the skin and subcutaneous tissue, unspecified: Secondary | ICD-10-CM

## 2013-06-15 DIAGNOSIS — L723 Sebaceous cyst: Secondary | ICD-10-CM | POA: Diagnosis not present

## 2013-06-15 MED ORDER — AMOXICILLIN-POT CLAVULANATE 875-125 MG PO TABS: 1.0000 | ORAL_TABLET | Freq: Two times a day (BID) | ORAL | Status: DC

## 2013-06-15 NOTE — Patient Instructions (Signed)
Take your antibiotic as prescribed until ALL of it is gone, but stop if you develop a rash, swelling, or any side effects of the medication.  Contact our office as soon as possible if  there are side effects of the medication.  Epidermal Cyst An epidermal cyst is sometimes called a sebaceous cyst, epidermal inclusion cyst, or infundibular cyst. These cysts usually contain a substance that looks "pasty" or "cheesy" and may have a bad smell. This substance is a protein called keratin. Epidermal cysts are usually found on the face, neck, or trunk. They may also occur in the vaginal area or other parts of the genitalia of both men and women. Epidermal cysts are usually small, painless, slow-growing bumps or lumps that move freely under the skin. It is important not to try to pop them. This may cause an infection and lead to tenderness and swelling. CAUSES  Epidermal cysts may be caused by a deep penetrating injury to the skin or a plugged hair follicle, often associated with acne. SYMPTOMS  Epidermal cysts can become inflamed and cause:  Redness.  Tenderness.  Increased temperature of the skin over the bumps or lumps.  Grayish-white, bad smelling material that drains from the bump or lump. DIAGNOSIS  Epidermal cysts are easily diagnosed by your caregiver during an exam. Rarely, a tissue sample (biopsy) may be taken to rule out other conditions that may resemble epidermal cysts. TREATMENT   Epidermal cysts often get better and disappear on their own. They are rarely ever cancerous.  If a cyst becomes infected, it may become inflamed and tender. This may require opening and draining the cyst. Treatment with antibiotics may be necessary. When the infection is gone, the cyst may be removed with minor surgery.  Small, inflamed cysts can often be treated with antibiotics or by injecting steroid medicines.  Sometimes, epidermal cysts become large and bothersome. If this happens, surgical removal in  your caregiver's office may be necessary. HOME CARE INSTRUCTIONS  Only take over-the-counter or prescription medicines as directed by your caregiver.  Take your antibiotics as directed. Finish them even if you start to feel better. SEEK MEDICAL CARE IF:   Your cyst becomes tender, red, or swollen.  Your condition is not improving or is getting worse.  You have any other questions or concerns. MAKE SURE YOU:  Understand these instructions.  Will watch your condition.  Will get help right away if you are not doing well or get worse. Document Released: 03/30/2004 Document Revised: 07/22/2011 Document Reviewed: 11/05/2010 [employer] Patient Information 2014 [employer].  

## 2013-06-15 NOTE — Progress Notes (Signed)
Subjective:    Patient ID: BIFF RUTIGLIANO, male    DOB: 25-Mar-1936, 78 y.o.   MRN: [id number]  HPI  78 year old patient who presents today with a chief complaint of an inflammatory lesion involving the right elbow region. He was seen at urgent care recently and did have a left olecranon bursitis drained. This has improved He has been followed by hematology due to iron deficiency anemia. He was diagnosed recently with testosterone deficiency and has been on replacement therapy for 2 days.  Past Medical History  Diagnosis Date  . BENIGN PROSTATIC HYPERTROPHY, HX OF 10/25/2008  . BRADYCARDIA 10/25/2008  . HYPERTENSION 11/21/2006  . 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 PACEMAKER, PERMANENT 10/25/2008  . TOBACCO ABUSE 10/25/2008    Quit 2012  . Iron deficiency anemia, unspecified 04/19/2013    History   Social History  . Marital Status: Divorced    Spouse Name: N/A    Number of Children: N/A  . Years of Education: N/A   Occupational History  . Not on file.   Social History Main Topics  . Smoking status: Former Smoker -- 0.25 packs/day for 46 years    Types: Cigarettes    Quit date: 06/15/2010  . Smokeless tobacco: Never Used     Comment: quit 3 years ago0  . Alcohol Use: 6.6 oz/week    6 Glasses of wine, 5 Cans of beer per week  . Drug Use: No  . Sexual Activity: Not on file   Other Topics Concern  . 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    Past Surgical History  Procedure Laterality Date  . Pacemaker insertion  2005  . Kidney stone surgery    . Colonoscopy  [id number],7846  . Skin graft Right 1962    wrist    Family History  Problem Relation Age of Onset  . Breast cancer Sister     Living, 37  . Diabetes type II Brother     Living, 82  . Breast cancer Sister     Living, 55  . Heart attack Father     Died, 14  . Kidney disease Father   . Parkinson's  disease Mother     Died, 60  . Diabetes Daughter     Living, 69  . Diabetes Son     Living, 77  . Colon cancer Neg Hx   . Rectal cancer Neg Hx   . Stomach cancer Neg Hx     No Known Allergies  Current Outpatient Prescriptions on File Prior to Visit  Medication Sig Dispense Refill  . cholecalciferol (VITAMIN D) 400 UNITS TABS tablet Take 400 Units by mouth.      . Cyanocobalamin (VITAMIN B 12 PO) Take 1,000 mg by mouth daily.      Marland Kitchen diltiazem (CARDIZEM LA) 240 MG 24 hr tablet Take 1 tablet (240 mg total) by mouth daily.  90 tablet  3  . Garlic 962 MG CAPS Take 1,000 mg by mouth 3 (three) times a week.       . naproxen (NAPROSYN) 500 MG tablet Take 1 tablet (500 mg total) by mouth 2 (two) times daily.  60 tablet  0  . Niacin (VITAMIN B-3 PO) Take by mouth every morning.      . Omega-3 Fatty Acids (FISH OIL) 1000 MG CAPS Take by mouth every morning.      Marland Kitchen  pramoxine-hydrocortisone (PROCTOCREAM-HC) 1-1 % rectal cream Use at bedtime as needed  30 g  1  . tamsulosin (FLOMAX) 0.4 MG CAPS capsule Take 1 capsule (0.4 mg total) by mouth daily.  90 capsule  3  . testosterone (ANDROGEL) 50 MG/5GM GEL Place 5 g onto the skin daily.  150 Package  0   No current facility-administered medications on file prior to visit.    BP 140/88  Pulse 89  Temp(Src) 98.4 F (36.9 C) (Oral)  Resp 20  Ht 5\' 11"  (1.803 m)  Wt 190 lb (86.183 kg)  BMI 26.51 kg/m2  SpO2 98%       Review of Systems  Skin: Positive for wound.       Objective:   Physical Exam  Constitutional: He appears well-developed and well-nourished. No distress.  Skin:  3 cm annular lesion just distal to the right elbow region. This area was slightly erythematous dry scaly but no fluctuance.          Assessment & Plan:   Resolving furuncle  right elbow region; we'll place on oral antibiotic therapy Local wound care discussed Hypertension stable

## 2013-06-15 NOTE — Progress Notes (Signed)
Pre-visit discussion using our clinic review tool. No additional management support is needed unless otherwise documented below in the visit note.  

## 2013-06-16 ENCOUNTER — Telehealth: Payer: Self-pay | Admitting: Internal Medicine

## 2013-06-16 NOTE — Telephone Encounter (Signed)
Relevant patient education mailed to patient.  

## 2013-07-09 ENCOUNTER — Ambulatory Visit (INDEPENDENT_AMBULATORY_CARE_PROVIDER_SITE_OTHER): Payer: Medicare Other | Admitting: *Deleted

## 2013-07-09 DIAGNOSIS — I498 Other specified cardiac arrhythmias: Secondary | ICD-10-CM

## 2013-07-09 LAB — MDC_IDC_ENUM_SESS_TYPE_REMOTE
Battery Impedance: 111 Ohm
Battery Remaining Longevity: 158 mo
Battery Voltage: 2.78 V
Brady Statistic AP VS Percent: 0 %
Brady Statistic AS VP Percent: 5 %
Brady Statistic AS VS Percent: 95 %
Date Time Interrogation Session: 20150227115635
Lead Channel Impedance Value: 442 Ohm
Lead Channel Impedance Value: 686 Ohm
Lead Channel Pacing Threshold Amplitude: 0.75 V
Lead Channel Pacing Threshold Pulse Width: 0.4 ms
Lead Channel Pacing Threshold Pulse Width: 0.4 ms
Lead Channel Sensing Intrinsic Amplitude: 2.8 mV
Lead Channel Setting Pacing Amplitude: 2 V
Lead Channel Setting Pacing Amplitude: 2.5 V
Lead Channel Setting Sensing Sensitivity: 2 mV
MDC IDC MSMT LEADCHNL RV PACING THRESHOLD AMPLITUDE: 0.5 V
MDC IDC MSMT LEADCHNL RV SENSING INTR AMPL: 5.6 mV
MDC IDC SET LEADCHNL RV PACING PULSEWIDTH: 0.4 ms
MDC IDC STAT BRADY AP VP PERCENT: 0 %

## 2013-07-12 ENCOUNTER — Encounter: Payer: Self-pay | Admitting: Internal Medicine

## 2013-07-14 ENCOUNTER — Ambulatory Visit (HOSPITAL_BASED_OUTPATIENT_CLINIC_OR_DEPARTMENT_OTHER)
Admission: RE | Admit: 2013-07-14 | Discharge: 2013-07-14 | Disposition: A | Discharge status: Home / Self Care | Payer: Medicare Other | Source: Ambulatory Visit | Attending: Hematology & Oncology | Admitting: Hematology & Oncology

## 2013-07-14 ENCOUNTER — Other Ambulatory Visit (HOSPITAL_BASED_OUTPATIENT_CLINIC_OR_DEPARTMENT_OTHER): Payer: Medicare Other | Admitting: Lab

## 2013-07-14 ENCOUNTER — Encounter: Payer: Self-pay | Admitting: Hematology & Oncology

## 2013-07-14 ENCOUNTER — Ambulatory Visit (HOSPITAL_BASED_OUTPATIENT_CLINIC_OR_DEPARTMENT_OTHER): Payer: Medicare Other | Admitting: Hematology & Oncology

## 2013-07-14 VITALS — BP 161/87 | HR 84 | Temp 98.1°F | Resp 18 | Ht 72.0 in | Wt 183.0 lb

## 2013-07-14 DIAGNOSIS — M129 Arthropathy, unspecified: Secondary | ICD-10-CM | POA: Diagnosis not present

## 2013-07-14 DIAGNOSIS — R5381 Other malaise: Secondary | ICD-10-CM

## 2013-07-14 DIAGNOSIS — M1990 Unspecified osteoarthritis, unspecified site: Secondary | ICD-10-CM

## 2013-07-14 DIAGNOSIS — L723 Sebaceous cyst: Secondary | ICD-10-CM | POA: Diagnosis not present

## 2013-07-14 DIAGNOSIS — R160 Hepatomegaly, not elsewhere classified: Secondary | ICD-10-CM | POA: Insufficient documentation

## 2013-07-14 DIAGNOSIS — M7020 Olecranon bursitis, unspecified elbow: Secondary | ICD-10-CM | POA: Diagnosis not present

## 2013-07-14 DIAGNOSIS — M19049 Primary osteoarthritis, unspecified hand: Secondary | ICD-10-CM | POA: Diagnosis not present

## 2013-07-14 DIAGNOSIS — D509 Iron deficiency anemia, unspecified: Secondary | ICD-10-CM | POA: Diagnosis not present

## 2013-07-14 DIAGNOSIS — R5383 Other fatigue: Secondary | ICD-10-CM

## 2013-07-14 DIAGNOSIS — I10 Essential (primary) hypertension: Secondary | ICD-10-CM | POA: Diagnosis not present

## 2013-07-14 DIAGNOSIS — D126 Benign neoplasm of colon, unspecified: Secondary | ICD-10-CM | POA: Diagnosis not present

## 2013-07-14 DIAGNOSIS — D472 Monoclonal gammopathy: Secondary | ICD-10-CM | POA: Diagnosis not present

## 2013-07-14 LAB — CBC WITH DIFFERENTIAL (CANCER CENTER ONLY)
BASO#: 0 10*3/uL (ref 0.0–0.2)
BASO%: 1 % (ref 0.0–2.0)
EOS%: 1.3 % (ref 0.0–7.0)
Eosinophils Absolute: 0 10*3/uL (ref 0.0–0.5)
HCT: 37.3 % — ABNORMAL LOW (ref 38.7–49.9)
HEMOGLOBIN: 12.5 g/dL — AB (ref 13.0–17.1)
LYMPH#: 1.4 10*3/uL (ref 0.9–3.3)
LYMPH%: 46.2 % (ref 14.0–48.0)
MCH: 27.9 pg — ABNORMAL LOW (ref 28.0–33.4)
MCHC: 33.5 g/dL (ref 32.0–35.9)
MCV: 83 fL (ref 82–98)
MONO#: 0.5 10*3/uL (ref 0.1–0.9)
MONO%: 17.4 % — ABNORMAL HIGH (ref 0.0–13.0)
NEUT%: 34.1 % — ABNORMAL LOW (ref 40.0–80.0)
NEUTROS ABS: 1 10*3/uL — AB (ref 1.5–6.5)
Platelets: 105 10*3/uL — ABNORMAL LOW (ref 145–400)
RBC: 4.48 10*6/uL (ref 4.20–5.70)
RDW: 19.5 % — ABNORMAL HIGH (ref 11.1–15.7)
WBC: 3.1 10*3/uL — AB (ref 4.0–10.0)

## 2013-07-14 LAB — SEDIMENTATION RATE: SED RATE: 5 mm/h (ref 0–16)

## 2013-07-15 LAB — RHEUMATOID FACTOR: Rhuematoid fact SerPl-aCnc: <10 IU/mL (ref <=14)

## 2013-07-15 LAB — ANA: Anti Nuclear Antibody(ANA): NEGATIVE

## 2013-07-16 ENCOUNTER — Telehealth: Payer: Self-pay | Admitting: *Deleted

## 2013-07-16 LAB — COMPREHENSIVE METABOLIC PANEL
ALBUMIN: 3.2 g/dL — AB (ref 3.5–5.2)
ALT: 79 U/L — AB (ref 0–53)
AST: 164 U/L — AB (ref 0–37)
Alkaline Phosphatase: 110 U/L (ref 39–117)
BUN: 7 mg/dL (ref 6–23)
CALCIUM: 8.4 mg/dL (ref 8.4–10.5)
CHLORIDE: 104 meq/L (ref 96–112)
CO2: 21 mEq/L (ref 19–32)
Creatinine, Ser: 0.89 mg/dL (ref 0.50–1.35)
Glucose, Bld: 120 mg/dL — ABNORMAL HIGH (ref 70–99)
POTASSIUM: 3.9 meq/L (ref 3.5–5.3)
Sodium: 138 mEq/L (ref 135–145)
Total Bilirubin: 1.1 mg/dL (ref 0.2–1.2)
Total Protein: 6.7 g/dL (ref 6.0–8.3)

## 2013-07-16 LAB — IGG, IGA, IGM
IgA: 725 mg/dL — ABNORMAL HIGH (ref 68–379)
IgG (Immunoglobin G), Serum: 1170 mg/dL (ref 650–1600)
IgM, Serum: 102 mg/dL (ref 41–251)

## 2013-07-16 LAB — PROTEIN ELECTROPHORESIS, SERUM
ALPHA-1-GLOBULIN: 4.6 % (ref 2.9–4.9)
ALPHA-2-GLOBULIN: 9.4 % (ref 7.1–11.8)
Albumin ELP: 50.3 % — ABNORMAL LOW (ref 55.8–66.1)
BETA 2: 10 % — AB (ref 3.2–6.5)
BETA GLOBULIN: 6.9 % (ref 4.7–7.2)
GAMMA GLOBULIN: 18.8 % (ref 11.1–18.8)
Total Protein, Serum Electrophoresis: 6.7 g/dL (ref 6.0–8.3)

## 2013-07-16 LAB — KAPPA/LAMBDA LIGHT CHAINS
Kappa free light chain: 3.57 mg/dL — ABNORMAL HIGH (ref 0.33–1.94)
Kappa:Lambda Ratio: 0.76 (ref 0.26–1.65)
Lambda Free Lght Chn: 4.68 mg/dL — ABNORMAL HIGH (ref 0.57–2.63)

## 2013-07-16 NOTE — Telephone Encounter (Signed)
Called patient to let him know that his xrays are ok. Some arthritis noted.  Also possible cirrhosis noted on ultrasound so an MRI will be set up.  Left message on patients personal cell phone

## 2013-07-19 ENCOUNTER — Telehealth: Payer: Self-pay | Admitting: Hematology & Oncology

## 2013-07-19 NOTE — Telephone Encounter (Signed)
Pt left message to call, I left him message to call back

## 2013-07-19 NOTE — Telephone Encounter (Signed)
Left pt message to call about appointment

## 2013-07-19 NOTE — Telephone Encounter (Signed)
Pt called about follow up appointment. Per MD pt is aware we may order MRI and to still come for 4-2 appointment

## 2013-07-19 NOTE — Progress Notes (Signed)
Hematology and Oncology Follow Up Visit  Jonathon [name] [id number] 06/11/1935 78 y.o. 07/19/2013   Principle Diagnosis:   Iron deficiency anemia  Arthralgias  Current Therapy:    IV iron     Interim History:  Jonathon [name] is back for followup. Unfortunately, he still is having issues. He still is complaining of losing muscle mass. He is on testosterone.  He has a lot of joint swelling. I got x-rays of his hands. I thought that he has a collagen vascular issue. X-rays were negative.  We did do collagen vascular studies on him. His ANA was negative. Rheumatoid factor was normal.  I checked monoclonal studies on him. He did not have a monoclonal spike in the serum.  His LFTs are elevated. We got an ultrasound of his abdomen. His spleen looked okay. There were no lymph nodes. His liver, however, look like there maybe some cirrhosis. He says he doesn't drink. He may have a fatty liver. He will have to have an MRI.  Told him that he probably will need to see a gastroenterologist.    Medications: Current outpatient prescriptions:cholecalciferol (VITAMIN D) 400 UNITS TABS tablet, Take 400 Units by mouth., Disp: , Rfl: ;  Cyanocobalamin (VITAMIN B 12 PO), Take 1,000 mg by mouth daily., Disp: , Rfl: ;  diltiazem (CARDIZEM LA) 240 MG 24 hr tablet, Take 1 tablet (240 mg total) by mouth daily., Disp: 90 tablet, Rfl: 3;  Garlic 829 MG CAPS, Take 1,000 mg by mouth every morning. , Disp: , Rfl:  naproxen (NAPROSYN) 500 MG tablet, Take 1 tablet (500 mg total) by mouth 2 (two) times daily., Disp: 60 tablet, Rfl: 0;  Niacin (VITAMIN B-3 PO), Take by mouth every morning., Disp: , Rfl: ;  Omega-3 Fatty Acids (FISH OIL) 1000 MG CAPS, Take by mouth every morning., Disp: , Rfl: ;  pramoxine-hydrocortisone (PROCTOCREAM-HC) 1-1 % rectal cream, Use at bedtime as needed, Disp: 30 g, Rfl: 1 tamsulosin (FLOMAX) 0.4 MG CAPS capsule, Take 1 capsule (0.4 mg total) by mouth daily., Disp: 90 capsule, Rfl: 3;  testosterone  (ANDROGEL) 50 MG/5GM GEL, Place 5 g onto the skin daily., Disp: 150 Package, Rfl: 0  Allergies: No Known Allergies  Past Medical History, Surgical history, Social history, and Family History were reviewed and updated.  Review of Systems: As above  Physical Exam:  height is 6' (1.829 m) and weight is 183 lb (83.008 kg). His oral temperature is 98.1 F (36.7 C). His blood pressure is 161/87 and his pulse is 84. His respiration is 18.   Well-developed well-nourished. Lungs clear. Cardiac exam regular rate and rhythm. Lymph nodes none. Abdomen soft. No fluid wave. No liver. No splenomegaly. Back exam no tenderness. Extremities shows a swelling in his joints. Skin exam no rashes. Neurological exam negative. Lab Results  Component Value Date   WBC 3.1* 07/14/2013   HGB 12.5* 07/14/2013   HCT 37.3* 07/14/2013   MCV 83 07/14/2013   PLT 105* 07/14/2013     Chemistry      Component Value Date/Time   NA 138 07/14/2013 1206   K 3.9 07/14/2013 1206   CL 104 07/14/2013 1206   CO2 21 07/14/2013 1206   BUN 7 07/14/2013 1206   CREATININE 0.89 07/14/2013 1206      Component Value Date/Time   CALCIUM 8.4 07/14/2013 1206   ALKPHOS 110 07/14/2013 1206   AST 164* 07/14/2013 1206   ALT 79* 07/14/2013 1206   BILITOT 1.1 07/14/2013 1206  Impression and Plan: Jonathon [name] is 78 year old gentleman. Am not sure what is really going on. I will go ahead and get an MRI of his liver.  I still have not found any hematologic issue that we have to deal with.  He has had a bone marrow biopsy done. This was negative the outside of low iron.  We'll see about getting him back in another couple weeks or so. We will see what the MRI shows.  Volanda Napoleon, MD 3/9/201511:49 AM

## 2013-07-20 ENCOUNTER — Telehealth: Payer: Self-pay | Admitting: Hematology & Oncology

## 2013-07-20 NOTE — Telephone Encounter (Signed)
Pt aware of 3-20 MRI

## 2013-07-29 ENCOUNTER — Encounter: Payer: Self-pay | Admitting: *Deleted

## 2013-07-30 ENCOUNTER — Ambulatory Visit (HOSPITAL_COMMUNITY)
Admission: RE | Admit: 2013-07-30 | Discharge: 2013-07-30 | Disposition: A | Discharge status: Home / Self Care | Payer: Medicare Other | Source: Ambulatory Visit | Attending: Hematology & Oncology | Admitting: Hematology & Oncology

## 2013-07-30 DIAGNOSIS — R160 Hepatomegaly, not elsewhere classified: Secondary | ICD-10-CM

## 2013-07-30 NOTE — Progress Notes (Signed)
PT HAS MR UNSAFE PACEMAKER, CANNOT HAVE MRI. -SD

## 2013-08-02 ENCOUNTER — Encounter: Payer: Self-pay | Admitting: *Deleted

## 2013-08-06 ENCOUNTER — Ambulatory Visit (INDEPENDENT_AMBULATORY_CARE_PROVIDER_SITE_OTHER): Payer: Medicare Other | Admitting: Internal Medicine

## 2013-08-06 ENCOUNTER — Encounter: Payer: Self-pay | Admitting: Internal Medicine

## 2013-08-06 VITALS — BP 160/90 | HR 87 | Temp 99.4°F | Resp 20 | Ht 71.0 in | Wt 182.0 lb

## 2013-08-06 DIAGNOSIS — D649 Anemia, unspecified: Secondary | ICD-10-CM | POA: Diagnosis not present

## 2013-08-06 DIAGNOSIS — M6281 Muscle weakness (generalized): Secondary | ICD-10-CM

## 2013-08-06 DIAGNOSIS — D61818 Other pancytopenia: Secondary | ICD-10-CM

## 2013-08-06 DIAGNOSIS — R945 Abnormal results of liver function studies: Secondary | ICD-10-CM

## 2013-08-06 DIAGNOSIS — I10 Essential (primary) hypertension: Secondary | ICD-10-CM

## 2013-08-06 DIAGNOSIS — E538 Deficiency of other specified B group vitamins: Secondary | ICD-10-CM | POA: Diagnosis not present

## 2013-08-06 DIAGNOSIS — Z950 Presence of cardiac pacemaker: Secondary | ICD-10-CM | POA: Diagnosis not present

## 2013-08-06 DIAGNOSIS — R7989 Other specified abnormal findings of blood chemistry: Secondary | ICD-10-CM

## 2013-08-06 LAB — VITAMIN B12: Vitamin B-12: 519 pg/mL (ref 211–911)

## 2013-08-06 NOTE — Patient Instructions (Signed)
GI consultation as discussed Followup neurology Lumbar CT scan  Abstain  from all alcohol

## 2013-08-06 NOTE — Progress Notes (Signed)
Pre-visit discussion using our clinic review tool. No additional management support is needed unless otherwise documented below in the visit note.  

## 2013-08-06 NOTE — Progress Notes (Signed)
Subjective:    Patient ID: Jonathon [name], male    DOB: November 09, 1935, 78 y.o.   MRN: [id number]  HPI 78 year old patient who is seen today in followup.  He was seen by neurology approximately 6 months ago due to muscle weakness and atrophy.  This is still a concern, but has not been very progressive.  He was referred to hematology due to anemia A bone marrow biopsy was unremarkable except for iron deficiency.  The patient has mild pancytopenia and now a slightly depressed albumin.  He states that he drinks 4 beers daily but denied any alcohol use at his last oncology appointment.  An abdominal ultrasound was consistent with  intrinsic liver disease.  Due to the history of joint swelling a collagen vascular panel has also been performed  Past Medical History  Diagnosis Date  . BENIGN PROSTATIC HYPERTROPHY, HX OF 10/25/2008  . BRADYCARDIA 10/25/2008  . HYPERTENSION 11/21/2006  . 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 PACEMAKER, PERMANENT 10/25/2008  . TOBACCO ABUSE 10/25/2008    Quit 2012  . Iron deficiency anemia, unspecified 04/19/2013    History   Social History  . Marital Status: Divorced    Spouse Name: N/A    Number of Children: N/A  . Years of Education: N/A   Occupational History  . Not on file.   Social History Main Topics  . Smoking status: Former Smoker -- 0.25 packs/day for 46 years    Types: Cigarettes    Start date: 03/16/1965    Quit date: 06/15/2010  . Smokeless tobacco: Never Used     Comment: quit 3 years ago0  . Alcohol Use: 6.6 oz/week    6 Glasses of wine, 5 Cans of beer per week  . Drug Use: No  . Sexual Activity: Not on file   Other Topics Concern  . 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    Past Surgical History  Procedure Laterality Date  . Pacemaker insertion  2005  . Kidney stone surgery    . Colonoscopy  1275,1700  . Skin graft  Right 1962    wrist    Family History  Problem Relation Age of Onset  . Breast cancer Sister     Living, 66  . Diabetes type II Brother     Living, 34  . Breast cancer Sister     Living, 17  . Heart attack Father     Died, 9  . Kidney disease Father   . Parkinson's disease Mother     Died, 62  . Diabetes Daughter     Living, 94  . Diabetes Son     Living, 50  . Colon cancer Neg Hx   . Rectal cancer Neg Hx   . Stomach cancer Neg Hx     No Known Allergies  Current Outpatient Prescriptions on File Prior to Visit  Medication Sig Dispense Refill  . cholecalciferol (VITAMIN D) 400 UNITS TABS tablet Take 400 Units by mouth.      . Cyanocobalamin (VITAMIN B 12 PO) Take 1,000 mg by mouth daily.      Marland Kitchen diltiazem (CARDIZEM LA) 240 MG 24 hr tablet Take 1 tablet (240 mg total) by mouth daily.  90 tablet  3  . Garlic 174 MG CAPS Take 1,000 mg by mouth every morning.       . Niacin (  VITAMIN B-3 PO) Take by mouth every morning.      . Omega-3 Fatty Acids (FISH OIL) 1000 MG CAPS Take by mouth every morning.      . pramoxine-hydrocortisone (PROCTOCREAM-HC) 1-1 % rectal cream Use at bedtime as needed  30 g  1  . tamsulosin (FLOMAX) 0.4 MG CAPS capsule Take 1 capsule (0.4 mg total) by mouth daily.  90 capsule  3  . testosterone (ANDROGEL) 50 MG/5GM GEL Place 5 g onto the skin daily.  150 Package  0   No current facility-administered medications on file prior to visit.    BP 160/90  Pulse 87  Temp(Src) 99.4 F (37.4 C) (Oral)  Resp 20  Ht _0  (1.803 m)  Wt 182 lb (82.555 kg)  BMI 25.40 kg/m2  SpO2 97%       Review of Systems  Musculoskeletal: Positive for myalgias.  Neurological: Positive for weakness.       Objective:   Physical Exam  Constitutional: He is oriented to person, place, and time. He appears well-developed.  HENT:  Head: Normocephalic.  Right Ear: External ear normal.  Left Ear: External ear normal.  Eyes: Conjunctivae and EOM are normal.  Neck:  Normal range of motion.  Cardiovascular: Normal rate and normal heart sounds.   Pulmonary/Chest: Breath sounds normal.  Abdominal: Bowel sounds are normal.  Liver is palpable on inspiration  Musculoskeletal: Normal range of motion. He exhibits no edema and no tenderness.  Muscle atrophy of the lower extremities , and intrinsic muscles of the hands, especially the thenar eminences noted  Able to do several squats without much difficulty  Neurological: He is alert and oriented to person, place, and time.  Psychiatric: He has a normal mood and affect. His behavior is normal.          Assessment & Plan:   Muscle pain, weakness, and atrophy.  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  Will schedule GI consultation Pancytopenia.  We'll check a B12 level, although bone marrow biopsy has been performed and was unremarkable

## 2013-08-07 ENCOUNTER — Telehealth: Payer: Self-pay | Admitting: Internal Medicine

## 2013-08-07 NOTE — Telephone Encounter (Signed)
Relevant patient education mailed to patient.  

## 2013-08-10 ENCOUNTER — Encounter: Payer: Self-pay | Admitting: Neurology

## 2013-08-10 ENCOUNTER — Ambulatory Visit (INDEPENDENT_AMBULATORY_CARE_PROVIDER_SITE_OTHER): Payer: Medicare Other | Admitting: Neurology

## 2013-08-10 VITALS — BP 150/92 | HR 88 | Resp 14 | Ht 71.0 in | Wt 181.0 lb

## 2013-08-10 DIAGNOSIS — M6281 Muscle weakness (generalized): Secondary | ICD-10-CM

## 2013-08-10 DIAGNOSIS — M6250 Muscle wasting and atrophy, not elsewhere classified, unspecified site: Secondary | ICD-10-CM

## 2013-08-10 DIAGNOSIS — M6258 Muscle wasting and atrophy, not elsewhere classified, other site: Secondary | ICD-10-CM

## 2013-08-10 NOTE — Patient Instructions (Addendum)
1.  CT cervical and lumbar spine 2.  Repeat EMG - left side  3.  Check GM1, CK, aldolase 4.  Encouraged to add ensure to diet, given loss of appetite 5.  Return to clinic in 4-6 weeks

## 2013-08-10 NOTE — Progress Notes (Signed)
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 year-old right-handed African American male with history of BPH, hypertension, bradycardia s/p pacemaker, and previous tobacco use presenting for follow-up of generalized weakness and muscle atrophy.  The patient was accompanied to the clinic by self.  He was last seen in the office on 02/01/2013.  History of present illness: Patient was in his usual state of health until 2013. He has always been physical active and would go to the gym daily (walking up 10 miles/day). During the spring of 2013, he developed relatively subacute onset of left > right hamstring soreness without an inciting event. Pain initially started in the butt area and radiated down. There is mild soreness of the calf bilaterally. It is exacerbated by prolonged sitting, such as when driving. There is associated proximal leg weakness and loss of muscle mass. He tried doing 50lb squats at the gym to strengthen his quadriceps over the past month but has not noticed any change in his symptoms. Functionally, he is still able to complete all his usual activities, but has became much more cautious when climbing stairs.   - Follow-up 02/01/2013:  He continues to have weakness of his proximal legs, worse on the left.  He continues to lift weights in hopes of regaining muscle strength and bulk.   EMG did not show any myopathy or radiculopathy of the left lower extremity.  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    - Follow-up 08/10/2013: He has noticed loss of muscle bulk over the upper arms and proximal legs. He has difficulty with climbing stairs and opening jars, and dyspnea on exertion. He feels unsteady at times, denies any falls. No post-exercise soreness, myalgias, twitches, cramps, neck pain, problems swallowing/ talking or  tea-colored urine.  He has occasional back pain.  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   He reports previously drinking 4 beers/day and on the weekends up to 6 beers/day and recently has stopped.   He also reports having 15lb unintentional weight loss and has been eating little.  Mood is down.     Medications:  Current Outpatient Prescriptions on File Prior to Visit  Medication Sig Dispense Refill  . cholecalciferol (VITAMIN D) 400 UNITS TABS tablet Take 400 Units by mouth.      . Cyanocobalamin (VITAMIN B 12 PO) Take 1,000 mg by mouth daily.      Marland Kitchen diltiazem (CARDIZEM LA) 240 MG 24 hr tablet Take 1 tablet (240 mg total) by mouth daily.  90 tablet  3  . Garlic 500 MG CAPS Take 1,000 mg by mouth every morning.       . Niacin (VITAMIN B-3 PO) Take by mouth every morning.      . Omega-3 Fatty Acids (FISH OIL) 1000 MG CAPS Take by mouth every morning.      . tamsulosin (FLOMAX) 0.4 MG CAPS capsule Take 1 capsule (0.4 mg total) by mouth daily.  90 capsule  3   No current facility-administered medications on file prior to visit.    Allergies: No Known Allergies   Review of Systems:  CONSTITUTIONAL: No fevers, chills, night sweats, +15 weight loss, + loss of appetitie EYES: No visual changes or eye pain ENT: No hearing changes.  No history of nose bleeds.   RESPIRATORY: No cough,  wheezing and shortness of breath.   CARDIOVASCULAR: Negative for chest pain, and palpitations.   GI: Negative for abdominal discomfort, blood in stools or black stools.  No recent change in bowel habits.   GU:  No history of incontinence.   MUSCLOSKELETAL: No history of joint pain or swelling.  No myalgias.   SKIN: Negative for lesions, rash, and itching.   HEMATOLOGY/ONCOLOGY: Negative for prolonged bleeding, bruising easily, and swollen nodes.  ENDOCRINE: Negative for cold or heat intolerance, polydipsia or goiter.   PSYCH:  +depression or anxiety symptoms.   NEURO: As  Above.   Vital Signs:  BP 150/92  Pulse 88  Resp 14  Ht _0  (1.803 m)  Wt 181 lb (82.101 kg)  BMI 25.26 kg/m2   Neurological Exam: MENTAL STATUS including orientation to time, place, person, recent and remote memory, attention span and concentration, language, and fund of knowledge is normal.  Speech is not dysarthric.  CRANIAL NERVES: II:  No visual field defects.   III-IV-VI: Pupils equal round and reactive to light.  Normal conjugate, extra-ocular eye movements in all directions of gaze. No ptosis V:  Normal facial sensation.  VII:  Normal facial symmetry and movements.   VIII:  Normal hearing and vestibular function.   IX-X:  Normal palatal movement.   XI:  Normal shoulder shrug and head rotation.   XII:  Normal tongue strength and range of motion, no deviation or fasciculation.  MOTOR:  Bilateral moderate ADM, FDI, ABP and moderate bilateral quadriceps atrophy.  No fasciculations or abnormal movements.  No pronator drift.    Right Upper Extremity:    Left Upper Extremity:    Deltoid  5/5   Deltoid  5/5   Biceps  5/5   Biceps  5/5   Triceps  5/5   Triceps  5/5   Wrist extensors  5/5   Wrist extensors  5/5   Wrist flexors  5/5   Wrist flexors  5/5   Finger extensors  5/5   Finger extensors  5/5   Finger flexors  5/5   Finger flexors  5/5   Dorsal interossei  3+/5   Dorsal interossei  3+/5   Abductor pollicis  4-/5   Abductor pollicis  4-/5   Tone (Ashworth scale)  0  Tone (Ashworth scale)  0   Right Lower Extremity:    Left Lower Extremity:    Hip flexors  5-/5   Hip flexors  5-/5   Hip extensors  5/5   Hip extensors  5/5   Knee flexors  5/5   Knee flexors  5/5   Knee extensors  5/5   Knee extensors  5/5   Dorsiflexors  5/5   Dorsiflexors  5/5   Plantarflexors  5/5   Plantarflexors  5/5   Toe extensors  5/5   Toe extensors  5/5   Toe flexors  5/5   Toe flexors  5/5   Tone (Ashworth scale)  0  Tone (Ashworth scale)  0   MSRs:  Reflexes are 2+/4 throughout,  except absent at Achilles.  Down-going plantar responses.  SENSORY:  Pin prick is reduced over the left leg.  Vibration also reduced only at left great toe, otherwise intact.  Temperature intact.  Rhomberg is negative.  COORDINATION/GAIT: Normal finger-to- nose-finger and heel-to-shin.  Intact rapid alternating movements bilaterally.  Able to rise from a chair without using arms.  Gait narrow based and stable. Tandem and stressed gait intact.  He is able  to perform squat and get up without difficulty  Data: Labs 01/13/2013:  CK 55, aldolase 7.9, TSH 0.39, copper 97, ceruloplasmin 30, vitamin  B12 519, ANA neg, RF neg, ESR 5  EMG 01/25/2013:   1. 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  2. Left ulnar neuropathy at the elbow, moderately severe in degree electrically. A superimposed C8 motor radiculopathy cannot be excluded. 3. No evidence of diffuse motor axon loss or a generalized myopathy.   IMPRESSION: Mr. Rothbauer is a delightful 78 year-old gentleman presenting for evaluation of leg weakness and loss of muscle bulk.  He has painless weakness of his hands and proximal legs.  On exam, there is marked atrophy and weakness of the intrinsic hand muscles (FDI, ABP,  ADM) and quadriceps.  I suspect that he is weaker in his legs, but I cannot overcome it.  Reflexes are normal and sensation is only mildly reduced over the left leg.  There is no upper motor neuron findings.   Prior labs including CK, aldolase, TSH, copper, B12, ESR, RF, ANA and ceruloplasmin is within normal limits.  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 Marin Olp, including bone marrow biopsy which was negative.  He was noted to have iron deficiency anemia and had iron transfusion.  He also has transaminatis and is having work-up for this.  His previous EMG shows left  median neuropathy predominately affecting motor fibers (moderate) at the wrist and left ulnar neuropathy (severe).  Reviewing his EMG again, although his H-reflex and F-waves can be attributed to patient's height, they can be subtle demyelinating features.  Multifocal motor neuropathy with conduction block is a possibility especially given the insidious course and muscle atrophy which is out of proportion to his weakness. There is no evidence of conduction block, however.  I will check GM1 and repeat CK and aldolase.  Structural process such as cervical (C7-8-T1) and lumbosacral (L2-4) foraminal stenosis may also cause weakness in his affected muscles, so I will order CT imaging of these levels.    I explained that I would like to repeat EMG of the left side to see if there is an ongoing process causing worsening drop of motor amplitudes or new demyelinating features.  Going forward, if the above testing is unrevealing, I will proceed with CSF analysis to look for inflammatory process, such as CIDP.   Other possibilities, include paraneoplastic process especially with recent weight loss, but he has no features suggestive of LEMS.  Progressive muscles atrophy is purely lower motor neuron process which needs to be considered if work-up is unrevealing.     PLAN/RECOMMENDATIONS:  1.  CT cervical and lumbar spine 2.  Repeat EMG - left side  3.  Check GM1, CK, aldolase 4.  Encouraged to add ensure to his diet, given loss of appetite 5.  Consider CSF testing going forward 6.  Return to clinic in 4-6 weeks   The duration of this appointment visit was 45 minutes of face-to-face time with the patient.  Greater than 50% of this time was spent in counseling, explanation of diagnosis, planning of further management, and coordination of care.   Thank you for allowing me to participate in patient's care.  If I can answer any additional questions, I would be pleased to do so.    Sincerely,    Donika K. Patel,  DO  cc:  Dr. Bluford Kaufmann, Dr. Burney Gauze

## 2013-08-11 ENCOUNTER — Ambulatory Visit (INDEPENDENT_AMBULATORY_CARE_PROVIDER_SITE_OTHER)
Admission: RE | Admit: 2013-08-11 | Discharge: 2013-08-11 | Disposition: A | Discharge status: Home / Self Care | Payer: Medicare Other | Source: Ambulatory Visit | Attending: Internal Medicine | Admitting: Internal Medicine

## 2013-08-11 DIAGNOSIS — D61818 Other pancytopenia: Secondary | ICD-10-CM

## 2013-08-11 DIAGNOSIS — R945 Abnormal results of liver function studies: Secondary | ICD-10-CM

## 2013-08-11 DIAGNOSIS — K7689 Other specified diseases of liver: Secondary | ICD-10-CM | POA: Diagnosis not present

## 2013-08-11 DIAGNOSIS — R7989 Other specified abnormal findings of blood chemistry: Secondary | ICD-10-CM | POA: Diagnosis not present

## 2013-08-11 DIAGNOSIS — K5730 Diverticulosis of large intestine without perforation or abscess without bleeding: Secondary | ICD-10-CM | POA: Diagnosis not present

## 2013-08-11 MED ORDER — IOHEXOL 300 MG/ML  SOLN
100.0000 mL | Freq: Once | INTRAMUSCULAR | Status: AC | PRN
  Administered 2013-08-11: 100 mL via INTRAVENOUS

## 2013-08-12 ENCOUNTER — Other Ambulatory Visit: Payer: Self-pay | Admitting: Hematology & Oncology

## 2013-08-12 ENCOUNTER — Ambulatory Visit (HOSPITAL_BASED_OUTPATIENT_CLINIC_OR_DEPARTMENT_OTHER): Payer: Medicare Other | Admitting: Hematology & Oncology

## 2013-08-12 ENCOUNTER — Other Ambulatory Visit (HOSPITAL_BASED_OUTPATIENT_CLINIC_OR_DEPARTMENT_OTHER): Payer: Medicare Other | Admitting: Lab

## 2013-08-12 ENCOUNTER — Encounter: Payer: Self-pay | Admitting: Hematology & Oncology

## 2013-08-12 VITALS — BP 125/78 | HR 93 | Temp 98.0°F | Resp 16 | Wt 189.0 lb

## 2013-08-12 DIAGNOSIS — I498 Other specified cardiac arrhythmias: Secondary | ICD-10-CM | POA: Diagnosis not present

## 2013-08-12 DIAGNOSIS — D509 Iron deficiency anemia, unspecified: Secondary | ICD-10-CM

## 2013-08-12 DIAGNOSIS — M129 Arthropathy, unspecified: Secondary | ICD-10-CM | POA: Diagnosis not present

## 2013-08-12 DIAGNOSIS — M1990 Unspecified osteoarthritis, unspecified site: Secondary | ICD-10-CM

## 2013-08-12 DIAGNOSIS — I10 Essential (primary) hypertension: Secondary | ICD-10-CM | POA: Diagnosis not present

## 2013-08-12 DIAGNOSIS — R160 Hepatomegaly, not elsewhere classified: Secondary | ICD-10-CM

## 2013-08-12 DIAGNOSIS — D649 Anemia, unspecified: Secondary | ICD-10-CM | POA: Diagnosis not present

## 2013-08-12 DIAGNOSIS — D126 Benign neoplasm of colon, unspecified: Secondary | ICD-10-CM | POA: Diagnosis not present

## 2013-08-12 DIAGNOSIS — M7020 Olecranon bursitis, unspecified elbow: Secondary | ICD-10-CM | POA: Diagnosis not present

## 2013-08-12 DIAGNOSIS — R7989 Other specified abnormal findings of blood chemistry: Secondary | ICD-10-CM | POA: Diagnosis not present

## 2013-08-12 DIAGNOSIS — R5381 Other malaise: Secondary | ICD-10-CM

## 2013-08-12 DIAGNOSIS — M6250 Muscle wasting and atrophy, not elsewhere classified, unspecified site: Secondary | ICD-10-CM | POA: Diagnosis not present

## 2013-08-12 DIAGNOSIS — M6281 Muscle weakness (generalized): Secondary | ICD-10-CM | POA: Diagnosis not present

## 2013-08-12 DIAGNOSIS — Z950 Presence of cardiac pacemaker: Secondary | ICD-10-CM | POA: Diagnosis not present

## 2013-08-12 DIAGNOSIS — D61818 Other pancytopenia: Secondary | ICD-10-CM | POA: Diagnosis not present

## 2013-08-12 DIAGNOSIS — D696 Thrombocytopenia, unspecified: Secondary | ICD-10-CM | POA: Diagnosis not present

## 2013-08-12 DIAGNOSIS — R5383 Other fatigue: Secondary | ICD-10-CM

## 2013-08-12 DIAGNOSIS — D72819 Decreased white blood cell count, unspecified: Secondary | ICD-10-CM

## 2013-08-12 DIAGNOSIS — E538 Deficiency of other specified B group vitamins: Secondary | ICD-10-CM | POA: Diagnosis not present

## 2013-08-12 DIAGNOSIS — L723 Sebaceous cyst: Secondary | ICD-10-CM | POA: Diagnosis not present

## 2013-08-12 LAB — CBC WITH DIFFERENTIAL (CANCER CENTER ONLY)
BASO#: 0 10*3/uL (ref 0.0–0.2)
BASO%: 0.7 % (ref 0.0–2.0)
EOS ABS: 0.1 10*3/uL (ref 0.0–0.5)
EOS%: 3.1 % (ref 0.0–7.0)
HCT: 41.8 % (ref 38.7–49.9)
HGB: 14.2 g/dL (ref 13.0–17.1)
LYMPH#: 1.2 10*3/uL (ref 0.9–3.3)
LYMPH%: 42 % (ref 14.0–48.0)
MCH: 29.3 pg (ref 28.0–33.4)
MCHC: 34 g/dL (ref 32.0–35.9)
MCV: 86 fL (ref 82–98)
MONO#: 0.5 10*3/uL (ref 0.1–0.9)
MONO%: 18.1 % — AB (ref 0.0–13.0)
NEUT#: 1.1 10*3/uL — ABNORMAL LOW (ref 1.5–6.5)
NEUT%: 36.1 % — ABNORMAL LOW (ref 40.0–80.0)
Platelets: 134 10*3/uL — ABNORMAL LOW (ref 145–400)
RBC: 4.84 10*6/uL (ref 4.20–5.70)
RDW: 14.4 % (ref 11.1–15.7)
WBC: 2.9 10*3/uL — AB (ref 4.0–10.0)

## 2013-08-12 LAB — CHCC SATELLITE - SMEAR

## 2013-08-12 NOTE — Progress Notes (Signed)
Hematology and Oncology Follow Up Visit  GEROGE GILLIAM [id number] [birth year] 78 y.o. 08/12/2013   Principle Diagnosis:   Iron deficiency anemia  Current Therapy:    IV iron as indicated     Interim History:  Mr.  Conran is back for followup. We'll last saw him, he had a change in his blood counts. His platelet count was down quite a bit. I felt his liver. We went ahead and got this evaluated. I wanted an MRI. Probably this was not done. Had a CT scan done yesterday. This showed hepatic steatosis. Outside of that, everything else was okay.  He has responded well to iron.  He was on testosterone. His level was low. I told him that he has to keep taking this. He stopped this about a week ago.  He's going to see the neurologist. It sounds like more of nerve conduction studies will be done.  His appetite has been good. Had no nausea vomiting. He's had no change in bowel or bladder habits. She's had no bleeding. He's had no rashes. There's been no leg swelling.  Medications: Current outpatient prescriptions:cholecalciferol (VITAMIN D) 400 UNITS TABS tablet, Take 400 Units by mouth., Disp: , Rfl: ;  Cyanocobalamin (VITAMIN B 12 PO), Take 1,000 mg by mouth daily., Disp: , Rfl: ;  diltiazem (CARDIZEM LA) 240 MG 24 hr tablet, Take 1 tablet (240 mg total) by mouth daily., Disp: 90 tablet, Rfl: 3;  Garlic 665 MG CAPS, Take 1,000 mg by mouth every morning. , Disp: , Rfl:  Niacin (VITAMIN B-3 PO), Take by mouth every morning., Disp: , Rfl: ;  Omega-3 Fatty Acids (FISH OIL) 1000 MG CAPS, Take by mouth every morning., Disp: , Rfl: ;  tamsulosin (FLOMAX) 0.4 MG CAPS capsule, Take 1 capsule (0.4 mg total) by mouth daily., Disp: 90 capsule, Rfl: 3;  testosterone (ANDROGEL) 50 MG/5GM (1%) GEL, , Disp: , Rfl:   Allergies: No Known Allergies  Past Medical History, Surgical history, Social history, and Family History were reviewed and updated.  Review of Systems: As above  Physical Exam:  weight is 189 lb  (85.73 kg). His oral temperature is 98 F (36.7 C). His blood pressure is 125/78 and his pulse is 93. His respiration is 16.   Well developed and well nourished African American gentleman. Head and neck exam shows no scleral icterus.. He has no adenopathy in the neck. He has no palpable thyroid. Lungs are clear. Cardiac exam regular in rhythm. No murmurs. Abdomen is soft. His liver edge is palpable at the right costal margin. There is no palpable spleen tip. Back exam no tenderness over the spine ribs or hips. Extremities shows no clubbing cyanosis or edema. Neurological exam shows no focal neurological deficits.  Lab Results  Component Value Date   WBC 2.9* 08/12/2013   HGB 14.2 08/12/2013   HCT 41.8 08/12/2013   MCV 86 08/12/2013   PLT 134* 08/12/2013     Chemistry      Component Value Date/Time   NA 138 07/14/2013 1206   K 3.9 07/14/2013 1206   CL 104 07/14/2013 1206   CO2 21 07/14/2013 1206   BUN 7 07/14/2013 1206   CREATININE 0.89 07/14/2013 1206      Component Value Date/Time   CALCIUM 8.4 07/14/2013 1206   ALKPHOS 110 07/14/2013 1206   AST 164* 07/14/2013 1206   ALT 79* 07/14/2013 1206   BILITOT 1.1 07/14/2013 1206         Impression and Plan:  Mr. Cozzolino is 78 year old done. He has history of iron deficiency anemia. He has what appears be hepatic steatosis. I suspect that the leukopenia and thrombocytopenia are reflective of this.  He are he has had a bone marrow biopsy done. This was pretty much unremarkable.  I would think that his iron studies should be okay. His blood count certainly is better.  I will plan to get him back in another 2 months for followup.   Volanda Napoleon, MD 4/2/201511:46 AM

## 2013-08-13 LAB — IRON AND TIBC CHCC
%SAT: 58 % — AB (ref 20–55)
Iron: 164 ug/dL — ABNORMAL HIGH (ref 42–163)
TIBC: 285 ug/dL (ref 202–409)
UIBC: 121 ug/dL (ref 117–376)

## 2013-08-13 LAB — FERRITIN CHCC: FERRITIN: 753 ng/mL — AB (ref 22–316)

## 2013-08-16 LAB — COMPREHENSIVE METABOLIC PANEL
ALT: 149 U/L — ABNORMAL HIGH (ref 0–53)
AST: 157 U/L — ABNORMAL HIGH (ref 0–37)
Albumin: 3.5 g/dL (ref 3.5–5.2)
Alkaline Phosphatase: 84 U/L (ref 39–117)
BILIRUBIN TOTAL: 1.2 mg/dL (ref 0.2–1.2)
BUN: 13 mg/dL (ref 6–23)
CO2: 28 meq/L (ref 19–32)
Calcium: 8.8 mg/dL (ref 8.4–10.5)
Chloride: 101 mEq/L (ref 96–112)
Creatinine, Ser: 0.87 mg/dL (ref 0.50–1.35)
GLUCOSE: 94 mg/dL (ref 70–99)
Potassium: 3.9 mEq/L (ref 3.5–5.3)
Sodium: 137 mEq/L (ref 135–145)
Total Protein: 6.8 g/dL (ref 6.0–8.3)

## 2013-08-16 LAB — LACTATE DEHYDROGENASE: LDH: 314 U/L — ABNORMAL HIGH (ref 94–250)

## 2013-08-16 LAB — ALDOLASE: Aldolase: 14.9 U/L — ABNORMAL HIGH (ref 3.3–10.3)

## 2013-08-16 LAB — GM1 ANTIBODY IGG, IGM
GM 1 IgG: <1:100 {titer}
GM 1 IgM: <1:100 {titer}

## 2013-08-16 LAB — CK: CK TOTAL: 91 U/L (ref 24–204)

## 2013-08-16 LAB — HEMOCHROMATOSIS DNA-PCR(C282Y,H63D)

## 2013-08-16 LAB — TESTOSTERONE: TESTOSTERONE: 337 ng/dL (ref 300–890)

## 2013-08-17 ENCOUNTER — Ambulatory Visit (HOSPITAL_COMMUNITY)
Admission: RE | Admit: 2013-08-17 | Discharge: 2013-08-17 | Disposition: A | Discharge status: Home / Self Care | Payer: Medicare Other | Source: Ambulatory Visit | Attending: Neurology | Admitting: Neurology

## 2013-08-17 DIAGNOSIS — M47817 Spondylosis without myelopathy or radiculopathy, lumbosacral region: Secondary | ICD-10-CM | POA: Diagnosis not present

## 2013-08-17 DIAGNOSIS — M48061 Spinal stenosis, lumbar region without neurogenic claudication: Secondary | ICD-10-CM | POA: Diagnosis not present

## 2013-08-17 DIAGNOSIS — M4802 Spinal stenosis, cervical region: Secondary | ICD-10-CM | POA: Diagnosis not present

## 2013-08-17 DIAGNOSIS — M47812 Spondylosis without myelopathy or radiculopathy, cervical region: Secondary | ICD-10-CM | POA: Diagnosis not present

## 2013-08-17 DIAGNOSIS — M6281 Muscle weakness (generalized): Secondary | ICD-10-CM | POA: Insufficient documentation

## 2013-08-18 ENCOUNTER — Telehealth: Payer: Self-pay | Admitting: Neurology

## 2013-08-18 NOTE — Telephone Encounter (Signed)
I called patient back and informed him that you were not in the office this afternoon but that one of [place] would call him back tomorrow.

## 2013-08-18 NOTE — Telephone Encounter (Signed)
Pt wants to talk to someone please call

## 2013-08-18 NOTE — Telephone Encounter (Signed)
I attempted to contact patient via phone today regarding the results of CT cervical and lumbar spine, however there was no answer so a message was left for the patient to return my call.   Donika K. Posey Pronto, DO

## 2013-08-19 NOTE — Telephone Encounter (Signed)
Called patient and informed him that labs are normal.  There is significant multilevel and multifactorial moderate to severe cervical spinal stenosis at the C5-C6 and C6-C7 level,  severe multifactorial neural foraminal stenosis at the  right C4, right C5, right C6, bilateral C7, and bilateral C8 nerve levels, and mild degenerative changes of the lumbar spine. EMG is pending.  Recommended starting physical therapy and occupational therapy, however patient reports he is very active at the gym and would like to continue his exercises there.   Donika K. Posey Pronto, DO

## 2013-09-10 ENCOUNTER — Other Ambulatory Visit: Payer: Self-pay | Admitting: *Deleted

## 2013-09-10 DIAGNOSIS — M6281 Muscle weakness (generalized): Secondary | ICD-10-CM

## 2013-09-16 ENCOUNTER — Ambulatory Visit (INDEPENDENT_AMBULATORY_CARE_PROVIDER_SITE_OTHER): Payer: Medicare Other | Admitting: Neurology

## 2013-09-16 DIAGNOSIS — M6281 Muscle weakness (generalized): Secondary | ICD-10-CM

## 2013-09-16 DIAGNOSIS — G610 Guillain-Barre syndrome: Secondary | ICD-10-CM

## 2013-09-17 ENCOUNTER — Encounter: Payer: Self-pay | Admitting: Neurology

## 2013-09-17 NOTE — Progress Notes (Signed)
See procedure note for EMG results.  Donika K. Patel, DO  

## 2013-09-17 NOTE — Procedures (Signed)
[facility] Neurology  [place], [place]  [place], [place] [address] Tel: [phone] Fax:  [phone] Test Date:  09/16/2013  Patient: Jonathon [name] DOB: 1936/05/09 Physician: Narda Amber, DO  Sex: Male Height: 5\' 11"  Ref Phys: Narda Amber  ID#: [id number] Temp: 34.5C Technician:    Patient Complaints: This is a 78 year-old gentleman presenting with worsening generalized weakness and gait abnormality.  NCV & EMG Findings: Extensive evaluation of the left upper and lower extremities with additional studies of the mid-thoracic paraspinal (T11 level) muscles reveals:  1. The median sensory response is prolonged (4.2 ms) and reduced in amplitude (8.6 V).  The ulnar sensory response is absent. The radial sensory response is normal.  2. Normal sural and superficial peroneal sensory responses. 3. The median motor response is prolonged (4.9 ms) and reduced in amplitude(4.5 mV).. The ulnar motor response is reduced in amplitude (6.0 mV), with evidence of conduction block in the forearm and across the elbow, and has decreased conduction velocity (A Elbow-B Elbow, 20 m/s).   4. The left peroneal motor nerve showed decreased conduction velocity (Poplt-B Fib, 29 m/s).  The left tibial motor nerve showed decreased conduction velocity (Knee-Ankle, 33 m/s).   5. F Wave studies indicate that the left tibial F wave has prolonged latency (62.85 ms).  All remaining F Wave latencies were within normal limits.   6. H-reflex studies indicate that the left tibial H-reflex has prolonged latency (46.26 ms).  The right tibial H-reflex has prolonged latency (39.32 ms).  7. There is evidence of diffuse and generalized chronic motor axon loss changes affecting the aorta the tested muscles of the upper and lower extremities, with accompanied active denervation. No fasciculation potentials are seen.  8. 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 When compared to his previous EMG dated 01/25/2013, there is an interval worsening of findings.    ___________________________ Narda Amber, DO    Nerve Conduction Studies Anti Sensory Summary Table   Site NR Peak (ms) Norm Peak (ms) P-T Amp (V) Norm P-T Amp  Left Median Anti Sensory (2nd Digit)  Wrist    4.2 <3.8 8.6 >10  Left Radial Anti Sensory (Base 1st Digit)  Wrist    2.7 <2.8 16.0 >10  Left Sup Peroneal Anti Sensory (Ant Lat Mall)  12 cm    3.2 <4.6 5.7 >3  Left Sural Anti Sensory (Lat Mall)  Calf    3.8 <4.6 3.5 >3  Left Ulnar Anti Sensory (5th Digit)  Wrist NR  <3.2  >5   Motor Summary Table   Site NR Onset (ms) Norm Onset (ms) O-P Amp (mV) Norm O-P Amp Site1 Site2 Delta-0 (ms) Dist (cm) Vel (m/s) Norm Vel (m/s)  Left Median Motor (Abd Poll Brev)  Wrist    4.9 <4.0 4.5 >5 Elbow Wrist 5.8 35.0 60 >50  Elbow    10.7  4.4         Left Peroneal Motor (Ext Dig Brev)  Ankle    4.5 <6.0 3.0 >2.5 B Fib Ankle 7.9 38.0 48 >40  B Fib    12.4  2.6  Poplt B Fib 3.5 10.0 29 >40  Poplt    15.9  2.4         Left Peroneal TA Motor (Tib Ant)  Fib Head  3.4 <4.5 4.5 >3 Poplit Fib Head 2.2 10.0 45 >40  Poplit    5.6  4.2         Left Tibial Motor (Abd Hall Brev)  Ankle    4.0 <6.0 6.3 >4 Knee Ankle 13.0 43.0 33 >40  Knee    17.0  4.6         Left Ulnar Motor (Abd Dig Minimi)  Wrist    3.0 <3.1 6.0 >7 B Elbow Wrist 5.0 27.0 54 >50  B Elbow    8.0  4.5  A Elbow B Elbow 5.1 10.0 20 >50  A Elbow    13.1  3.1          F Wave Studies   NR F-Lat (ms) Lat Norm (ms) L-R F-Lat (ms)  Left Tibial (Mrkrs) (Abd Hallucis)     62.85 <55   Left Ulnar (Mrkrs) (Abd Dig Min)     26.93 <33    H Reflex Studies   NR H-Lat (ms) Lat Norm (ms) L-R H-Lat (ms)  Left Tibial (Gastroc)     46.26 <35 6.94  Right  Tibial (Gastroc)     39.32 <35 6.94   EMG   Side Muscle Ins Act Fibs Psw Fasc Number Recrt Dur Dur. Amp Amp. Poly Poly. Comment  Left 1stDorInt Nml Nml Nml Nml 3- Rapid Most 1+ Most 1+ Nml Nml N/A  Left AntTibialis Nml Nml Nml Nml Nml Nml Nml Nml Nml Nml Nml Nml N/A  Left Gastroc Nml Nml Nml Nml 1- Mod-R Few 1+ Few 1+ Nml Nml N/A  Left VastusLat Nml Nml Nml Nml 1- Rapid Some 1+ Nml Nml Nml Nml N/A  Left RectFemoris Nml Nml Nml Nml 1- Mod-R Some 1+ Some 1+ Few 1+ E.R.  Left BicepsFemS Nml Nml Nml Nml 1- Mod-R Few 1+ Nml Nml Nml Nml N/A  Left T11 Parasp Nml Nml Nml Nml NE - - - - - All - N/A  Left Cervical Parasp Low Nml Nml Nml Nml NE - - - - - - - N/A  Left Flex Dig Long Nml Nml Nml Nml 2- Mod-R Some 1+ Nml Nml Nml Nml N/A  Left Abd Poll Brev Nml Nml Nml Nml 1- Mod-R Few 1+ Nml Nml Nml Nml N/A  Left FlexPolLong Nml Nml Nml Nml 1- Mod-R Few 1+ Few 1+ Nml Nml N/A  Left Ext Indicis Nml Nml Nml Nml Nml Nml Nml Nml Nml Nml Nml Nml N/A  Left PronatorTeres Nml Nml Nml Nml 1- Rapid Some 1+ Some 1+ Few 1+ N/A  Left ABD Dig Min Nml Nml Nml Nml 3- Rapid Most 1+ Most 1+ Nml Nml N/A  Left Biceps Nml Nml Nml Nml 1- Mod-R Some 1+ Some 1+ Few 1+ N/A  Left Triceps Nml Nml Nml Nml 2- Mod-R Some 1+ Some 1+ Few 1+ N/A  Left Deltoid Nml Nml Nml Nml Nml Nml Nml Nml Nml Nml Nml Nml N/A      Waveforms:

## 2013-09-20 ENCOUNTER — Other Ambulatory Visit: Payer: Self-pay

## 2013-09-21 ENCOUNTER — Telehealth: Payer: Self-pay | Admitting: Hematology & Oncology

## 2013-09-21 ENCOUNTER — Ambulatory Visit (INDEPENDENT_AMBULATORY_CARE_PROVIDER_SITE_OTHER): Payer: Medicare Other | Admitting: Neurology

## 2013-09-21 ENCOUNTER — Telehealth: Payer: Self-pay | Admitting: Neurology

## 2013-09-21 ENCOUNTER — Encounter: Payer: Self-pay | Admitting: Neurology

## 2013-09-21 VITALS — BP 140/94 | HR 82 | Ht 71.65 in | Wt 178.1 lb

## 2013-09-21 DIAGNOSIS — M6250 Muscle wasting and atrophy, not elsewhere classified, unspecified site: Secondary | ICD-10-CM | POA: Diagnosis not present

## 2013-09-21 DIAGNOSIS — G610 Guillain-Barre syndrome: Secondary | ICD-10-CM | POA: Diagnosis not present

## 2013-09-21 DIAGNOSIS — M6258 Muscle wasting and atrophy, not elsewhere classified, other site: Secondary | ICD-10-CM

## 2013-09-21 NOTE — Telephone Encounter (Signed)
Informed patient that his LP is scheduled for Friday, May 15 at 10:00.  Instructed him to arrive at 8:30 at the short stay area at [facility].  Also instructed him to be NPO after midnight the night before and to have a Gabi Mcfate on the day of the procedure.  Patient was given phone # in case he needs to reschedule.

## 2013-09-21 NOTE — Telephone Encounter (Signed)
Pt returning your call

## 2013-09-21 NOTE — Patient Instructions (Signed)
1.  2-hour glucose tolerance test 2.  We will schedule you for lumbar puncture  3.  Continue to add ensure to your diet 4.  OK to continue low impact exercises 5.  Return to clinic in 3-weeks

## 2013-09-21 NOTE — Telephone Encounter (Signed)
Patient called and cx 10/12/13 apt and resch for 10/27/13

## 2013-09-21 NOTE — Progress Notes (Signed)
[name]  Follow-up Visit   Date: 09/21/2013   Jonathon [name] MRN: [id number] DOB: November 07, 1935   Interim History: Jonathon [name] is a 78 y.o. year-old right-handed African American male with history of BPH, hypertension, bradycardia s/p pacemaker, and previous tobacco use presenting for follow-up of generalized weakness and muscle atrophy.  The patient was accompanied to the clinic by self.  He was last seen in the office on 08/10/2012.  History of present illness: Patient was in his usual state of health until 2013. He has always been physical active and would go to the gym daily (walking up 10 miles/day). During the spring of 2013, he developed relatively subacute onset of left > right hamstring soreness without an inciting event. Pain initially started in the butt area and radiated down. There is mild soreness of the calf bilaterally. It is exacerbated by prolonged sitting, such as when driving. There is associated proximal leg weakness and loss of muscle mass. He tried doing 50lb squats at the gym to strengthen his quadriceps over the past month but has not noticed any change in his symptoms. Functionally, he is still able to complete all his usual activities, but has became much more cautious when climbing stairs.   - Follow-up 02/01/2013:  He continues to have weakness of his proximal legs, worse on the left. EMG did not show any myopathy or radiculopathy of the left lower extremity.  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    CK, aldolase, B12 was within normal limits. SPEP/UPEP shows monoclonal IgA kappa gammopathy and he underwent extensive hematologic testing, including bone marrow biopsy by Dr. Marin [name] which has been nondiagnostic.    - Follow-up 08/10/2013: Progressive loss of muscle bulk over the upper arms and proximal legs. He has difficulty with climbing stairs and opening jars, and dyspnea on exertion. He feels unsteady  at times, denies any falls. No post-exercise soreness, myalgias, twitches, cramps, neck pain, problems swallowing/ talking or tea-colored urine.  He has occasional back pain.  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   He reports previously drinking 4 beers/day and on the weekends up to 6 beers/day and recently has stopped.   He also reports having 15lb unintentional weight loss and has been eating little.  Mood is down.    - Follow-up 09/21/2013:  He is here to discuss results of EMG, CT cervical and lumbar spine, and labs.  EMG shows generalized polyradiculoneuropathy with axon loss and demyelinating features.  CK is normal, aldolase elevated, and GM1 antibody is negative. There is evidence of severe multilevel spinal stenosis of the cervical spine, and milder findings in the lumbar region.   Clinically, his remains stable without any significant worsening. He added ensure to his diet and stabilized his weight.  Due to elevated LFTs, he is seeing Dr. Hilarie [name] in GI next week.     Medications:  Current Outpatient Prescriptions on File Prior to Visit  Medication Sig Dispense Refill  . cholecalciferol (VITAMIN D) 400 UNITS TABS tablet Take 400 Units by mouth.      . Cyanocobalamin (VITAMIN B 12 PO) Take 1,000 mg by mouth daily.      Marland Kitchen diltiazem (CARDIZEM LA) 240 MG 24 hr tablet Take 1 tablet (240 mg total) by mouth daily.  90 tablet  3  . Garlic 008 MG CAPS Take 1,000 mg by mouth every morning.       . Niacin (  VITAMIN B-3 PO) Take by mouth every morning.      . Omega-3 Fatty Acids (FISH OIL) 1000 MG CAPS Take by mouth every morning.      . tamsulosin (FLOMAX) 0.4 MG CAPS capsule Take 1 capsule (0.4 mg total) by mouth daily.  90 capsule  3  . testosterone (ANDROGEL) 50 MG/5GM (1%) GEL APPLY ONE PACKET EXTERNALLY TO SPECIFIC AREA OF SKIN DAILY  150 g  0   No current facility-administered medications on file prior to visit.    Allergies: No Known Allergies   Review of  Systems:  CONSTITUTIONAL: No fevers, chills, night sweats, + loss of appetitie EYES: No visual changes or eye pain ENT: No hearing changes.  No history of nose bleeds.   RESPIRATORY: No cough, wheezing and shortness of breath.   CARDIOVASCULAR: Negative for chest pain, and palpitations.   GI: Negative for abdominal discomfort, blood in stools or black stools.  No recent change in bowel habits.   GU:  No history of incontinence.   MUSCLOSKELETAL: No history of joint pain or swelling.  No myalgias.   SKIN: Negative for lesions, rash, and itching.   HEMATOLOGY/ONCOLOGY: Negative for prolonged bleeding, bruising easily, and swollen nodes.  ENDOCRINE: Negative for cold or heat intolerance, polydipsia or goiter.   PSYCH:  +depression or anxiety symptoms.   NEURO: As Above.   Vital Signs:  BP 140/94  Pulse 82  Ht 5' 11.65" (1.82 m)  Wt 178 lb 1 oz (80.769 kg)  BMI 24.38 kg/m2  SpO2 97%   Neurological Exam: MENTAL STATUS including orientation to time, place, person, recent and remote memory, attention span and concentration, language, and fund of knowledge is normal.  Speech is not dysarthric.  CRANIAL NERVES:  Pupils are round and reactive to light.  Normal conjugate, extra-ocular eye movements in all directions of gaze. No ptosis.  Face is symmetric.  Palate elevates symmetrically.  Normal shoulder shrug and head rotation.  Normal tongue strength and range of motion, no deviation or fasciculation.  MOTOR:  Bilateral moderate ADM, FDI, ABP and moderate bilateral quadriceps atrophy.  No fasciculations or abnormal movements.  No pronator drift.    Right Upper Extremity:    Left Upper Extremity:    Deltoid  5/5   Deltoid  5/5   Biceps  5/5   Biceps  5/5   Triceps  5/5   Triceps  5/5   Wrist extensors  5/5   Wrist extensors  5/5   Wrist flexors  5/5   Wrist flexors  5/5   Finger extensors  5/5   Finger extensors  5/5   Finger flexors  5/5   Finger flexors  5/5   Dorsal interossei  3+/5    Dorsal interossei  3+/5   Abductor pollicis  4-/5   Abductor pollicis  4-/5   Tone (Ashworth scale)  0  Tone (Ashworth scale)  0   Right Lower Extremity:    Left Lower Extremity:    Hip flexors  4+/5   Hip flexors  4+/5   Hip extensors  5/5   Hip extensors  5/5   Knee flexors  5/5   Knee flexors  5/5   Knee extensors  5/5   Knee extensors  5/5   Dorsiflexors  5/5   Dorsiflexors  5/5   Plantarflexors  5/5   Plantarflexors  5/5   Toe extensors  5/5   Toe extensors  5/5   Toe flexors  5/5   Toe  flexors  5/5   Tone (Ashworth scale)  0  Tone (Ashworth scale)  0   MSRs:  Reflexes are 1+/4 throughout, except absent at Achilles.  Down-going plantar responses.  SENSORY:  Pin prick is reduced over the left leg.  Vibration also reduced only at left great toe, otherwise intact.   COORDINATION/GAIT: Normal finger-to- nose-finger and heel-to-shin.  Intact rapid alternating movements bilaterally.  Able to rise from a chair without using arms.  Gait narrow based and stable. Tandem and stressed gait intact.  He is able to perform squat and get up without difficulty  Data: Labs 01/13/2013:  CK 55, aldolase 7.9, TSH 0.39, copper 97, ceruloplasmin 30,  Labs 07/2013:  vitamin  B12 519, ANA neg, RF neg, ESR 5 Labs 08/10/2013:  CK 91, aldolase 14.9*, GM1 antibody - neg  EMG 01/25/2013:   1. 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  2. Left ulnar neuropathy at the elbow, moderately severe in degree electrically. A superimposed C8 motor radiculopathy cannot be excluded. 3. No evidence of diffuse motor axon loss or a generalized myopathy.   EMG 09/16/2013: NCV & EMG Findings:  Extensive evaluation of the left upper and lower extremities with additional studies of the mid-thoracic paraspinal (T11 level) muscles reveals:  4. The median sensory response is prolonged (4.2 ms) and reduced in amplitude (8.6 V).  The ulnar sensory response is absent. The radial sensory response is normal.  5. Normal sural and superficial peroneal sensory responses. 6. The median motor response is prolonged (4.9 ms) and reduced in amplitude(4.5 mV).. The ulnar motor response is reduced in amplitude (6.0 mV), with evidence of conduction block in the forearm and across the elbow, and has decreased conduction velocity (A Elbow-B Elbow, 20 m/s).  7. The left peroneal motor nerve showed decreased conduction velocity (Poplt-B Fib, 29 m/s). The left tibial motor nerve showed decreased conduction velocity (Knee-Ankle, 33 m/s).  8. F Wave studies indicate that the left tibial F wave has prolonged latency (62.85 ms). All remaining F Wave latencies were within normal limits.  9. H-reflex studies indicate that the left tibial H-reflex has prolonged latency (46.26 ms). The right tibial H-reflex has prolonged latency (39.32 ms).  10. There is evidence of diffuse and generalized chronic motor axon loss changes affecting the aorta the tested muscles of the upper and lower extremities, with accompanied active denervation. No fasciculation potentials are seen.  11. 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 When compared to his previous EMG dated 01/25/2013, there is an interval worsening of findings.  CT cervical spine and lumbar spine 08/17/2013: 1. Advanced degenerative changes in the cervical spine, including multilevel bulky anterior endplate osteophytes.  2. Suspect multifactorial moderate to severe cervical spinal stenosis at the C5-C6 and C6-C7 levels. As the patient is not a  candidate for MRI, cervical spine CT myelogram would confirm.  3. Associated severe multifactorial neural foraminal stenosis at the right C4,  right C5, right C6, bilateral C7, and bilateral C8 nerve levels.  4. Mild for age lumbar spine degenerative changes, chiefly facet arthropathy. Up to mild multifactorial spinal stenosis at L2-L3 and L3-L4.    IMPRESSION: Jonathon [name] is a delightful 78 year-old gentleman returning for evaluation of leg weakness and loss  of muscle bulk.  He has painless weakness of his hands and proximal legs.  On exam, there is marked atrophy and weakness of the intrinsic hand muscles (FDI, ABP,  ADM) and quadriceps.  I suspect that he is weaker in his legs, but I cannot overcome it.  Reflexes are diminished and sensation is only mildly reduced over the left leg.  There is no upper motor neuron findings.   Labs including CK, TSH, copper, B12, ESR, RF, ANA, GM1 antibody, and ceruloplasmin is within normal limits.  His aldolase when rechecked is elevated (14.9) and he was found to have monoclonal IgA kappa gammopathy on serum for which he underwent extensive evaluation by Dr. Marin [name], including bone marrow biopsy which was negative.  He was noted to have iron deficiency anemia and had iron transfusion.  He also has transaminatis and is having work-up for this.  Repeat EMG shows progression of axon loss and demyelinating features affecting the left side, most suggestive of an acquired polyradiculoneuropathy with the presence of conduction block involving the ulnar nerve and prolonged late responses.  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  Additionally, there is not enough lumbar spinal stenosis to account for the EMG changes seen in the lumbosacral region.  Multifocal motor neuropathy with conduction block is a possibility especially given the insidious course and muscle atrophy which is out of proportion to his weakness, but testing for GM1 antibody returned  negative.    There has been interval progression of findings on his recent EMG which is most consistent with a chronic polyradiculoneuropathy and the next step is to obtain CSF testing looking for inflammatory changes.  Chronic inflammatory demylinating polyradiculoneuropathy is high on the differential, other possibilities include anti-MAG (especially with paraproteinemia), MADSAM, POEMS syndrome, sarcoid, WNV, and Lyme.  I had a lengthy discussion pathophysiology of polyradiculoneuropathy, potential etiologies, work-up to date, and management going forward.  I offered him the opportunity to ask questions and answered them to the best of my ability.    Active issues: 1.  Chronic polyradiculoneuropathy causing weakness 2.  IgA kappa monoclonal gammopathy of uncertain clinical significance 3.  Unintentional weight loss of 10-15lb 4.  Transaminitis   PLAN/RECOMMENDATIONS:  1.  2-hr glucose tolerance test and consider anti-MAG and/or VEGF going forward 2.  Lumbar puncture, will check CSF cell count and diff, protein, glucose,cultures, ACE, WNV, cytology, Lyme, IgG index, MBP, OCB 3.  Encouraged to add ensure to his diet, given loss of appetite 4.  Return to clinic in 3-weeks   The duration of this appointment visit was 40  minutes of face-to-face time with the patient.  Greater than 50% of this time was spent in counseling, explanation of diagnosis, planning of further management, and coordination of care.   Thank you for allowing me to participate in patient's care.  If I can answer any additional questions, I would be pleased to do so.    Sincerely,    Donika K. Posey Pronto, DO  cc:  Dr. Bluford Kaufmann, Dr. Burney Gauze, Dr. Zenovia Jarred

## 2013-09-21 NOTE — Addendum Note (Signed)
Addended by: Chester Holstein on: 09/21/2013 01:38 PM   Modules accepted: Orders

## 2013-09-22 ENCOUNTER — Encounter: Payer: Self-pay | Admitting: Internal Medicine

## 2013-09-22 ENCOUNTER — Ambulatory Visit (INDEPENDENT_AMBULATORY_CARE_PROVIDER_SITE_OTHER): Payer: Medicare Other | Admitting: Internal Medicine

## 2013-09-22 VITALS — BP 138/84 | HR 92 | Ht 71.65 in | Wt 180.0 lb

## 2013-09-22 DIAGNOSIS — R05 Cough: Secondary | ICD-10-CM | POA: Diagnosis not present

## 2013-09-22 DIAGNOSIS — R634 Abnormal weight loss: Secondary | ICD-10-CM | POA: Diagnosis not present

## 2013-09-22 DIAGNOSIS — F17200 Nicotine dependence, unspecified, uncomplicated: Secondary | ICD-10-CM

## 2013-09-22 DIAGNOSIS — R059 Cough, unspecified: Secondary | ICD-10-CM | POA: Diagnosis not present

## 2013-09-22 DIAGNOSIS — I498 Other specified cardiac arrhythmias: Secondary | ICD-10-CM

## 2013-09-22 DIAGNOSIS — Z950 Presence of cardiac pacemaker: Secondary | ICD-10-CM | POA: Diagnosis not present

## 2013-09-22 DIAGNOSIS — Z720 Tobacco use: Secondary | ICD-10-CM

## 2013-09-22 DIAGNOSIS — R0602 Shortness of breath: Secondary | ICD-10-CM

## 2013-09-22 LAB — MDC_IDC_ENUM_SESS_TYPE_INCLINIC
Battery Impedance: 134 Ohm
Battery Remaining Longevity: 151 mo
Battery Voltage: 2.78 V
Lead Channel Impedance Value: 474 Ohm
Lead Channel Pacing Threshold Pulse Width: 0.4 ms
Lead Channel Pacing Threshold Pulse Width: 0.4 ms
Lead Channel Sensing Intrinsic Amplitude: 4 mV
Lead Channel Setting Pacing Amplitude: 2 V
Lead Channel Setting Pacing Amplitude: 2.5 V
Lead Channel Setting Sensing Sensitivity: 2.8 mV
MDC IDC MSMT LEADCHNL RA PACING THRESHOLD AMPLITUDE: 0.75 V
MDC IDC MSMT LEADCHNL RV IMPEDANCE VALUE: 686 Ohm
MDC IDC MSMT LEADCHNL RV PACING THRESHOLD AMPLITUDE: 0.5 V
MDC IDC MSMT LEADCHNL RV SENSING INTR AMPL: 5.6 mV
MDC IDC SESS DTM: 20150513173838
MDC IDC SET LEADCHNL RV PACING PULSEWIDTH: 0.4 ms
MDC IDC STAT BRADY AP VP PERCENT: 0 %
MDC IDC STAT BRADY AP VS PERCENT: 0 %
MDC IDC STAT BRADY AS VP PERCENT: 4 %
MDC IDC STAT BRADY AS VS PERCENT: 96 %

## 2013-09-22 NOTE — Progress Notes (Signed)
HPI Jonathon [name] returns today for followup. He is a pleasant 78 yo man with a h/o symptomatic bradycardia, s/p PPM. He has had problems with weakness, muscle loss and fatigue. He also notes palpitations which are not related to any of the above symptoms and do not bother him much. No syncope. Despite this, he works part time.  No Known Allergies   Current Outpatient Prescriptions  Medication Sig Dispense Refill  . cholecalciferol (VITAMIN D) 400 UNITS TABS tablet Take 400 Units by mouth.      . Cyanocobalamin (VITAMIN B 12 PO) Take 1,000 mg by mouth daily.      Marland Kitchen diltiazem (CARDIZEM LA) 240 MG 24 hr tablet Take 1 tablet (240 mg total) by mouth daily.  90 tablet  3  . Garlic 528 MG CAPS Take 1,000 mg by mouth every morning.       . Niacin (VITAMIN B-3 PO) Take by mouth every morning.      . Omega-3 Fatty Acids (FISH OIL) 1000 MG CAPS Take by mouth every morning.      . tamsulosin (FLOMAX) 0.4 MG CAPS capsule Take 1 capsule (0.4 mg total) by mouth daily.  90 capsule  3   No current facility-administered medications for this visit.     Past Medical History  Diagnosis Date  . BENIGN PROSTATIC HYPERTROPHY, HX OF 10/25/2008  . BRADYCARDIA 10/25/2008  . HYPERTENSION 11/21/2006  . 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 PACEMAKER, PERMANENT 10/25/2008  . TOBACCO ABUSE 10/25/2008    Quit 2012  . Iron deficiency anemia, unspecified 04/19/2013    ROS:   All systems reviewed and negative except as noted in the HPI.   Past Surgical History  Procedure Laterality Date  . Pacemaker insertion  2005  . Kidney stone surgery    . Colonoscopy  4132,4401  . Skin graft Right 1962    wrist     Family History  Problem Relation Age of Onset  . Breast cancer Sister     Living, 1  . Diabetes type II Brother     Living, 3  . Breast cancer Sister     Living, 54  . Heart attack Father     Died, 77  . Kidney disease Father   . Parkinson's disease Mother     Died, 28  .  Diabetes Daughter     Living, 84  . Diabetes Son     Living, 71  . Colon cancer Neg Hx   . Rectal cancer Neg Hx   . Stomach cancer Neg Hx      History   Social History  . Marital Status: Divorced    Spouse Name: N/A    Number of Children: N/A  . Years of Education: N/A   Occupational History  . Not on file.   Social History Main Topics  . Smoking status: Former Smoker -- 0.25 packs/day for 46 years    Types: Cigarettes    Start date: 03/16/1965    Quit date: 06/15/2010  . Smokeless tobacco: Never Used     Comment: quit 3 years ago0  . Alcohol Use: No     Comment: just stopped drinking  . Drug Use: No  . Sexual Activity: Not on file   Other Topics Concern  . 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     BP 138/84  Pulse 92  Ht 5' 11.65" (1.82 m)  Wt 180 lb (81.647 kg)  BMI 24.65 kg/m2  Physical Exam:  Well appearing 78 yo man, NAD HEENT: Unremarkable Neck:  6 cm JVD, no thyromegally Back:  No CVA tenderness Lungs:  Clear with no wheezes, rales, or rhonchi, well healed PPM incision HEART:  Regular rate rhythm, no murmurs, no rubs, no clicks Abd:  soft, positive bowel sounds, no organomegally, no rebound, no guarding Ext:  2 plus pulses, no edema, no cyanosis, no clubbing Skin:  No rashes no nodules Neuro:  CN II through XII intact, motor grossly intact  DEVICE  Normal device function.  See PaceArt for details.   Assess/Plan:

## 2013-09-22 NOTE — Assessment & Plan Note (Signed)
His medtronic PPM is working normally. Will recheck in several months.  

## 2013-09-22 NOTE — Assessment & Plan Note (Signed)
His symptoms are minimal but along with his weakness, mild weight loss and smoking history, I have recommended he undergo a CXR.

## 2013-09-22 NOTE — Patient Instructions (Signed)
Your physician wants you to follow-up in: 12 months with Dr Knox Saliva will receive a reminder letter in the mail two months in advance. If you don't receive a letter, please call our office to schedule the follow-up appointment.  Remote monitoring is used to monitor your Pacemaker of ICD from home. This monitoring reduces the number of office visits required to check your device to one time per year. It allows [place] to keep an eye on the functioning of your device to ensure it is working properly. You are scheduled for a device check from home on 12/22/13. You may send your transmission at any time that day. If you have a wireless device, the transmission will be sent automatically. After your physician reviews your transmission, you will receive a postcard with your next transmission date.   A chest x-ray takes a picture of the organs and structures inside the chest, including the heart, lungs, and blood vessels. This test can show several things, including, whether the heart is enlarges; whether fluid is building up in the lungs; and whether pacemaker / defibrillator leads are still in place.

## 2013-09-23 ENCOUNTER — Encounter: Payer: Self-pay | Admitting: Internal Medicine

## 2013-09-23 ENCOUNTER — Ambulatory Visit: Payer: Medicare Other | Admitting: Neurology

## 2013-09-23 ENCOUNTER — Ambulatory Visit (HOSPITAL_COMMUNITY)
Admission: RE | Admit: 2013-09-23 | Discharge: 2013-09-23 | Disposition: A | Discharge status: Home / Self Care | Payer: Medicare Other | Source: Ambulatory Visit | Attending: Internal Medicine | Admitting: Internal Medicine

## 2013-09-23 DIAGNOSIS — R059 Cough, unspecified: Secondary | ICD-10-CM

## 2013-09-23 DIAGNOSIS — G610 Guillain-Barre syndrome: Secondary | ICD-10-CM | POA: Diagnosis not present

## 2013-09-23 DIAGNOSIS — R05 Cough: Secondary | ICD-10-CM

## 2013-09-23 DIAGNOSIS — R634 Abnormal weight loss: Secondary | ICD-10-CM | POA: Diagnosis not present

## 2013-09-23 DIAGNOSIS — Z950 Presence of cardiac pacemaker: Secondary | ICD-10-CM | POA: Insufficient documentation

## 2013-09-23 DIAGNOSIS — R5381 Other malaise: Secondary | ICD-10-CM | POA: Diagnosis not present

## 2013-09-23 DIAGNOSIS — R5383 Other fatigue: Secondary | ICD-10-CM

## 2013-09-23 DIAGNOSIS — R000 Tachycardia, unspecified: Secondary | ICD-10-CM | POA: Diagnosis not present

## 2013-09-23 DIAGNOSIS — Z720 Tobacco use: Secondary | ICD-10-CM

## 2013-09-24 ENCOUNTER — Ambulatory Visit (HOSPITAL_COMMUNITY)
Admission: RE | Admit: 2013-09-24 | Discharge: 2013-09-24 | Disposition: A | Discharge status: Home / Self Care | Payer: Medicare Other | Source: Ambulatory Visit | Attending: Neurology | Admitting: Neurology

## 2013-09-24 ENCOUNTER — Other Ambulatory Visit: Payer: Self-pay | Admitting: Radiology

## 2013-09-24 DIAGNOSIS — M6258 Muscle wasting and atrophy, not elsewhere classified, other site: Secondary | ICD-10-CM

## 2013-09-24 DIAGNOSIS — M6250 Muscle wasting and atrophy, not elsewhere classified, unspecified site: Secondary | ICD-10-CM | POA: Diagnosis not present

## 2013-09-24 DIAGNOSIS — R839 Unspecified abnormal finding in cerebrospinal fluid: Secondary | ICD-10-CM | POA: Diagnosis not present

## 2013-09-24 DIAGNOSIS — G610 Guillain-Barre syndrome: Secondary | ICD-10-CM

## 2013-09-24 LAB — GRAM STAIN

## 2013-09-24 LAB — CSF CELL COUNT WITH DIFFERENTIAL
RBC COUNT CSF: 1 /mm3 — AB
Tube #: 3
WBC CSF: 0 /mm3 (ref 0–5)

## 2013-09-24 LAB — PROTEIN, CSF: Total  Protein, CSF: 42 mg/dL (ref 15–45)

## 2013-09-24 LAB — GLUCOSE, CSF: Glucose, CSF: 59 mg/dL (ref 43–76)

## 2013-09-24 MED ORDER — ACETAMINOPHEN 500 MG PO TABS
1000.0000 mg | ORAL_TABLET | Freq: Four times a day (QID) | ORAL | Status: DC | PRN
  Filled 2013-09-24: qty 2

## 2013-09-24 NOTE — Procedures (Signed)
Fluoro guided LP performed at O0-6 without complications.  Opening pressure 18cm H2O.  Clear CSF obtained and sent to lab.  No immediate complications.  Full report dictated in [facility].

## 2013-09-24 NOTE — Discharge Instructions (Signed)
Myelogram and Lumbar Puncture Discharge Instructions ° °1. Go home and rest quietly for the next 24 hours.  It is important to lie flat for the next 24 hours.  Get up only to go to the restroom.  You may lie in the bed or on a couch on your back, your stomach, your left side or your right side.  You may have one pillow under your head.  You may have pillows between your knees while you are on your side or under your knees while you are on your back. ° °2. DO NOT drive today.  Recline the seat as far back as it will go, while still wearing your seat belt, on the way home. ° °3. You may get up to go to the bathroom as needed.  You may sit up for 10 minutes to eat.  You may resume your normal diet and medications unless otherwise indicated. ° °4. The incidence of headache, nausea, or vomiting is about 5% (one in 20 patients).  If you develop a headache, lie flat and drink plenty of fluids until the headache goes away.  Caffeinated beverages may be helpful.  If you develop severe nausea and vomiting or a headache that does not go away with flat bed rest, call  [phone] ° °5. You may resume normal activities after your 24 hours of bed rest is over; however, do not exert yourself strongly or do any heavy lifting tomorrow. ° °6. Call your physician for a follow-up appointment.  The results of your myelogram will be sent directly to your physician by the following day. ° °7. If you have any questions or if complications develop after you arrive home, please call [phone] ° °Discharge instructions have been explained to the patient.  The patient, or the person responsible for the patient, fully understands these instructions. ° ° °

## 2013-09-27 ENCOUNTER — Ambulatory Visit (INDEPENDENT_AMBULATORY_CARE_PROVIDER_SITE_OTHER): Payer: Medicare Other | Admitting: Internal Medicine

## 2013-09-27 ENCOUNTER — Other Ambulatory Visit (INDEPENDENT_AMBULATORY_CARE_PROVIDER_SITE_OTHER): Payer: Medicare Other

## 2013-09-27 ENCOUNTER — Encounter: Payer: Self-pay | Admitting: Internal Medicine

## 2013-09-27 VITALS — BP 110/70 | HR 124 | Ht 70.08 in | Wt 179.1 lb

## 2013-09-27 DIAGNOSIS — D509 Iron deficiency anemia, unspecified: Secondary | ICD-10-CM | POA: Diagnosis not present

## 2013-09-27 DIAGNOSIS — R7989 Other specified abnormal findings of blood chemistry: Secondary | ICD-10-CM

## 2013-09-27 DIAGNOSIS — Z5181 Encounter for therapeutic drug level monitoring: Secondary | ICD-10-CM

## 2013-09-27 DIAGNOSIS — R945 Abnormal results of liver function studies: Secondary | ICD-10-CM

## 2013-09-27 DIAGNOSIS — K644 Residual hemorrhoidal skin tags: Secondary | ICD-10-CM | POA: Diagnosis not present

## 2013-09-27 DIAGNOSIS — Z86010 Personal history of colonic polyps: Secondary | ICD-10-CM | POA: Diagnosis not present

## 2013-09-27 LAB — HEPATITIS A ANTIBODY, TOTAL: Hep A Total Ab: NONREACTIVE

## 2013-09-27 LAB — CBC
HEMATOCRIT: 41.9 % (ref 39.0–52.0)
Hemoglobin: 14.1 g/dL (ref 13.0–17.0)
MCHC: 33.5 g/dL (ref 30.0–36.0)
MCV: 89.3 fl (ref 78.0–100.0)
Platelets: 165 10*3/uL (ref 150.0–400.0)
RBC: 4.7 Mil/uL (ref 4.22–5.81)
RDW: 14 % (ref 11.5–15.5)
WBC: 3.9 10*3/uL — ABNORMAL LOW (ref 4.0–10.5)

## 2013-09-27 LAB — COMPREHENSIVE METABOLIC PANEL
ALBUMIN: 3.6 g/dL (ref 3.5–5.2)
ALT: 91 U/L — ABNORMAL HIGH (ref 0–53)
AST: 124 U/L — ABNORMAL HIGH (ref 0–37)
Alkaline Phosphatase: 87 U/L (ref 39–117)
BILIRUBIN TOTAL: 0.8 mg/dL (ref 0.2–1.2)
BUN: 13 mg/dL (ref 6–23)
CALCIUM: 9 mg/dL (ref 8.4–10.5)
CO2: 26 mEq/L (ref 19–32)
Chloride: 107 mEq/L (ref 96–112)
Creatinine, Ser: 1 mg/dL (ref 0.4–1.5)
GFR: 96.86 mL/min (ref >60.00)
Glucose, Bld: 110 mg/dL — ABNORMAL HIGH (ref 70–99)
Potassium: 4.3 mEq/L (ref 3.5–5.1)
Sodium: 142 mEq/L (ref 135–145)
Total Protein: 7.8 g/dL (ref 6.0–8.3)

## 2013-09-27 LAB — CSF CULTURE W GRAM STAIN
Culture: NO GROWTH
Gram Stain: NONE SEEN

## 2013-09-27 LAB — HEPATITIS B CORE ANTIBODY, TOTAL: Hep B Core Total Ab: NONREACTIVE

## 2013-09-27 LAB — CSF CULTURE

## 2013-09-27 LAB — HEPATITIS C ANTIBODY: HCV Ab: NEGATIVE

## 2013-09-27 LAB — MYELIN BASIC PROTEIN, CSF

## 2013-09-27 LAB — HEPATITIS B SURFACE ANTIBODY,QUALITATIVE: HEP B S AB: POSITIVE — AB

## 2013-09-27 LAB — HEPATITIS B SURFACE ANTIGEN: Hepatitis B Surface Ag: NEGATIVE

## 2013-09-27 NOTE — Progress Notes (Signed)
Patient ID: Jonathon [name], male   DOB: [birth year], 78 y.o.   MRN: [id number] HPI: Jonathon [name] is a 78 year old male with a past medical history of colonic polyp, diverticulosis, iron deficiency anemia, kidney stones, hypertension, BPH who is seen in consultation at the request of Dr. Posey Pronto to evaluate elevated liver enzymes. He is here alone today. He reports 2 years of muscle pain and weakness which has been recently worked up by Dr. Posey Pronto of neurology. He is also been seen by Dr. Marin Olp with hematology. 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 He recently had a lumbar puncture but has not heard the results. He did have an unrevealing bone marrow biopsy. Extensive lab testing has been performed including a normal CK, TSH, copper, B12, ESR, RF, ANA, ceruloplasmin, hep C antibody, serum copper, and GM 1 antibody. He has had an elevated lipase and IgA. IgG and IgM were normal. He has undergone EMG.  From a liver standpoint he denies a prior history of liver injury or known liver disease. He also denies a family history of liver disease. He denies history of jaundice, itching, ascites or significant lower extremity edema. He does drink alcohol previously fairly heavily. He reports he was drinking 6 beers daily but since he's gone back to work he's cut this to 3-4 beers daily. His primary care provider asked that he try to cease alcohol entirely. He denies abdominal pain. No nausea or vomiting. No heartburn, dysphagia or odynophagia. No dyspnea or chest pain. He does report decreased appetite over the last 6-12 months. He has lost 10-12 pounds. He reports normal bowel habits without diarrhea or constipation. He occasionally sees bright red blood on the stool and with wiping. He had a colonoscopy earlier this year with Dr. Verl Blalock.  He denies new medications and is only taking diltiazem and tamsulosin  daily. He occasionally uses fish oil, garlic, vitamin D, and B. Vitamins  Muscle weakness has continued primarily in his bilateral upper extremities.  In the past she has been told he had iron deficiency and received IV iron.  Past Medical History  Diagnosis Date  . BENIGN PROSTATIC HYPERTROPHY, HX OF 10/25/2008  . BRADYCARDIA 10/25/2008  . HYPERTENSION 11/21/2006  . 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 PACEMAKER, PERMANENT 10/25/2008  . TOBACCO ABUSE 10/25/2008    Quit 2012  . Iron deficiency anemia, unspecified 04/19/2013  . Asthma     Past Surgical History  Procedure Laterality Date  . Pacemaker insertion  2005  . Colonoscopy  [phone]  . Skin graft Right 1962    wrist    Current Outpatient Prescriptions  Medication Sig Dispense Refill  . cholecalciferol (VITAMIN D) 400 UNITS TABS tablet Take 400 Units by mouth daily.       . Cyanocobalamin (VITAMIN B 12 PO) Take 1,000 mg by mouth daily.      Marland Kitchen diltiazem (CARDIZEM LA) 240 MG 24 hr tablet Take 1 tablet (240 mg total) by mouth daily.  90 tablet  3  . Garlic 974 MG CAPS Take 1,000 mg by mouth every morning.       . Niacin (VITAMIN B-3 PO) Take by mouth every morning.      . Omega-3 Fatty Acids (FISH OIL) 1000 MG CAPS Take by mouth every morning.      . tamsulosin (FLOMAX) 0.4 MG CAPS capsule Take 1 capsule (0.4 mg total) by mouth daily.  90 capsule  3   No current facility-administered medications for this visit.    No Known Allergies  Family History  Problem Relation Age of Onset  . Breast cancer Sister     Living, 52  . Diabetes type II Brother     Living, 33  . Breast cancer Sister     Living, 57  . Heart attack Father     Died, 38  . Kidney disease Father   . Parkinson's disease Mother     Died, 40  . Diabetes Daughter     Living, 48  . Diabetes Son     Living, 76  . Colon cancer Neg Hx   . Rectal cancer Neg Hx   . Stomach cancer Neg Hx   . Ovarian cancer Daughter      History  Substance Use Topics  . Smoking status: Former Smoker -- 0.25 packs/day for 46 years    Types: Cigarettes    Start date: 03/16/1965    Quit date: 06/15/2010  . Smokeless tobacco: Never Used     Comment: quit 3 years ago0  . Alcohol Use: 0.0 oz/week     Comment: 4 beers per day    ROS: As per history of present illness, otherwise negative  BP 110/70  Pulse 124  Ht 5' 10.08" (1.78 m)  Wt 179 lb 2 oz (81.251 kg)  BMI 25.64 kg/m2 Constitutional: Well-developed and well-nourished. No distress. HEENT: Normocephalic and atraumatic. Oropharynx is clear and moist. No oropharyngeal exudate. Conjunctivae are normal.  No scleral icterus. Neck: Neck supple. Trachea midline. Cardiovascular: Tachycardic, regular, no murmur Pulmonary/chest: Effort normal and breath sounds normal. No wheezing, rales or rhonchi. Abdominal: Soft, nontender, nondistended. Bowel sounds active throughout. There are no masses palpable. No hepatosplenomegaly. Extremities: no clubbing, cyanosis, or edema Lymphadenopathy: No cervical adenopathy noted. Neurological: Alert and oriented to person place and time. Skin: Skin is warm and dry. No rashes noted. Psychiatric: Normal mood and affect. Behavior is normal.  RELEVANT LABS AND IMAGING: CBC    Component Value Date/Time   WBC 2.9* 08/12/2013 1020   WBC 3.6* 04/07/2013 0720   RBC 4.84 08/12/2013 1020   RBC 5.11 06/02/2013 1035   RBC 4.28 04/07/2013 0720   HGB 14.2 08/12/2013 1020   HGB 11.0* 04/07/2013 0720   HCT 41.8 08/12/2013 1020   HCT 32.4* 04/07/2013 0720   PLT 134* 08/12/2013 1020   PLT 170 04/07/2013 0720   MCV 86 08/12/2013 1020   MCV 75.7* 04/07/2013 0720   MCH 29.3 08/12/2013 1020   MCH 25.7* 04/07/2013 0720   MCHC 34.0 08/12/2013 1020   MCHC 34.0 04/07/2013 0720   RDW 14.4 08/12/2013 1020   RDW 16.6* 04/07/2013 0720   LYMPHSABS 1.2 08/12/2013 1020   LYMPHSABS 1.2 01/14/2013 0952   MONOABS 0.9 01/14/2013 0952   EOSABS 0.1 08/12/2013 1020   EOSABS 0.1  01/14/2013 0952   BASOSABS 0.0 08/12/2013 1020   BASOSABS 0.0 01/14/2013 0952    CMP     Component Value Date/Time   NA 137 08/12/2013 1020   K 3.9 08/12/2013 1020   CL 101 08/12/2013 1020   CO2 28 08/12/2013 1020   GLUCOSE 94 08/12/2013 1020   BUN 13 08/12/2013 1020   CREATININE 0.87 08/12/2013 1020   CALCIUM 8.8 08/12/2013 1020   PROT 6.8 08/12/2013 1020   ALBUMIN 3.5 08/12/2013 1020   AST 157* 08/12/2013 1020   ALT 149* 08/12/2013 1020   ALKPHOS 84 08/12/2013 1020  BILITOT 1.2 08/12/2013 1020   GFRNONAA 67* 06/19/2012 1123   GFRAA 78* 06/19/2012 1123   Results for ALIK, MAWSON (MRN [id number]) as of 09/27/2013 09:23  Ref. Range 12/01/2008 14:02 01/11/2011 14:31 01/14/2012 15:47 06/10/2012 12:05 06/19/2012 11:23 01/13/2013 15:14 01/14/2013 09:52 03/24/2013 11:44 07/14/2013 12:06 08/12/2013 10:20  Alkaline Phosphatase Latest Range: 39-117 U/L 67 70 81    95 110 110 84  Albumin Latest Range: 3.5-5.2 g/dL 4.0 4.0 3.6    3.5 4.0 3.2 (L) 3.5  AST Latest Range: 0-37 U/L 48 (H) 75 (H) 64 (H)    77 (H) 261 (H) 164 (H) 157 (H)  ALT Latest Range: 0-53 U/L 42 65 (H) 54 (H)    58 (H) 196 (H) 79 (H) 149 (H)  Total Protein Latest Range: 6.0-8.3 g/dL 7.7 7.6 7.1    7.8 7.9 6.7 6.8  Bilirubin, Direct Latest Range: 0.0-0.3 mg/dL 0.1 0.0          Total Bilirubin Latest Range: 0.2-1.2 mg/dL 1.0 0.7 0.8    0.6 0.8 1.1 1.2   Iron/TIBC/Ferritin    Component Value Date/Time   IRON 164* 08/12/2013 1020   IRON 33* 03/24/2013 1411   TIBC 285 08/12/2013 1020   TIBC 461* 03/24/2013 1411   FERRITIN 753* 08/12/2013 1020   FERRITIN 97 03/24/2013 1411   ANA negative IGG normal  Hep C - neg  Serum copper - normal  Hemachromatosis gene - heterozygote H63D   IMAGING -- MARCH 2015 ULTRASOUND ABDOMEN COMPLETE   COMPARISON:  US ABDOMEN COMPLETE dated 01/20/2013   FINDINGS: Gallbladder:   No gallstones or wall thickening visualized. No sonographic Murphy sign noted.   Common bile duct:   Diameter: Normal caliber, 3 mm   Liver:   Coarsened,  increased echotexture throughout the liver suggesting fatty infiltration or intrinsic liver disease. No visible nodularity to the liver contours. No focal abnormality or biliary ductal dilatation.   IVC:   No abnormality visualized.   Pancreas:   Visualized portion unremarkable.   Spleen:   Size and appearance within normal limits.   Right Kidney:   Length: 10.4 cm. Echogenicity within normal limits. No mass or hydronephrosis visualized.   Left Kidney:   Length: 9.8 cm. Echogenicity within normal limits. No mass or hydronephrosis visualized.   Abdominal aorta:   No aneurysm visualized.   Other findings:   None.   IMPRESSION: Increased echotexture throughout the liver suggesting fatty infiltration or intrinsic liver disease.  _______________________________________________________________________________________________________ CT ABDOMEN AND PELVIS WITH CONTRAST   TECHNIQUE: Multidetector CT imaging of the abdomen and pelvis was performed using the standard protocol following bolus administration of intravenous contrast.   CONTRAST:  112m OMNIPAQUE IOHEXOL 300 MG/ML  SOLN   COMPARISON:  None.   FINDINGS: Mild to moderate hepatic steatosis is demonstrated. No liver masses are identified. Gallbladder is unremarkable in appearance, and there is no evidence of biliary ductal dilatation.   The pancreas, spleen, and adrenal glands are normal in appearance. Small renal cysts are noted, however there is no evidence of renal mass or hydronephrosis. No soft tissue masses or lymphadenopathy identified within the abdomen or pelvis.   Prostate gland is normal in size. Mild diffuse bladder wall thickening is seen as well as a small diverticulum along the right lateral bladder wall. This is most likely due to chronic bladder outlet obstruction or neurogenic bladder. No evidence of acute inflammatory process or abnormal fluid collections. Diverticulosis is seen  involving the descending colon, however there is no  evidence of diverticulitis. No other inflammatory process or abnormal fluid collections identified. No suspicious bone lesions identified.   IMPRESSION: Mild to moderate hepatic steatosis.  No liver mass identified.   Diverticulosis. No radiographic evidence of diverticulitis.   Mild diffuse bladder wall thickening and small bladder diverticulum, most often seen with chronic bladder outlet obstruction or neurogenic bladder.  ASSESSMENT/PLAN: 78 year old male with a past medical history of colonic polyp, diverticulosis, iron deficiency anemia, kidney stones, hypertension, BPH who is seen in consultation at the request of Dr. Posey Pronto to evaluate elevated liver enzymes  1.  Elevated serum transaminases -- he does have elevation of serum AST and ALT which has increased slightly over the last several years. By ultrasound and CT he had evidence of steatosis which certainly could explain some element of his transaminitis. He has had multiple labs which have been unrevealing as to an additional source of his transaminitis. He does drink alcohol but the pattern of injury is not consistent with alcoholic hepatitis. Certainly alcohol could contribute to steatosis, and I do think the steatosis is causing some liver inflammation/hepatitis .  Muscle disorder/injury are also in the differential for elevated transaminases and so some element of his liver test abnormality may be muscle in origin.  I have asked that he try to decrease his alcohol intake, ideally to 0, but no more than 2 beers a day. I will check hepatitis B serology and repeat hepatitis C antibody. Also check hepatitis A to determine if he is immune. By mouth check an anti-smooth muscle antibody. His serum ferritin was elevated earlier this year which led to a hemachromatosis gene analysis. 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 I do not think  his liver enzyme abnormalities secondary to hemachromatosis, and it is likely that his elevated ferritin was an acute phase reactant.  There is no evidence of chronic or mass liver disease at this point such as cirrhosis. He does have a pancytopenia --Repeat CMP and CBC today with INR --Followup in 3 months --We also discussed the possibility of liver biopsy, but I do not feel this is necessary at present. We will trend his liver enzymes and discuss at followup  2.  Pancytopenia -- unclear etiology, being followed by hematology  3.  Polyradiculoneuropathy -- following up with Dr. Posey Pronto  4.  Hx of colon polyps -- colonoscopy repeated in January of 2015 by Dr. Sharlett Iles, he did have anorectal prolapse changes consistent with previous hemorrhoids and perhaps fissures. An anorectal polyp was a prolapsed polyp without adenoma or dysplasia. Consider repeat in 5 yrs given history of previous colon polyps

## 2013-09-27 NOTE — Patient Instructions (Signed)
Your physician has requested that you go to the basement for lab work before leaving today.    Follow up with Dr. Hilarie Fredrickson in office in 12 weeks (August) labs before appt.    Dr. Hilarie Fredrickson recommends you cut back on your alcohol consumption to 2 beers a day.

## 2013-09-28 LAB — WEST NILE AB, IGG AND IGM, CSF: West Nile Ab, IgG, CSF: <1.3 index (ref <1.30)

## 2013-09-28 LAB — ANTI-SMOOTH MUSCLE ANTIBODY, IGG: Smooth Muscle Ab: 15 U (ref <20)

## 2013-09-28 LAB — ANGIOTENSIN CONVERTING ENZYME, CSF: Angio Convert Enzyme: 5 U/L (ref <=15)

## 2013-09-28 LAB — PROTIME-INR
INR: 1 ratio (ref 0.8–1.0)
Prothrombin Time: 11.3 s (ref 9.6–13.1)

## 2013-09-28 LAB — CSF IGG: IGG CSF: 4.4 mg/dL (ref 0.8–7.7)

## 2013-09-29 ENCOUNTER — Other Ambulatory Visit: Payer: Self-pay

## 2013-09-29 LAB — OLIGOCLONAL BANDS, CSF + SERM

## 2013-10-08 ENCOUNTER — Telehealth: Payer: Self-pay | Admitting: Neurology

## 2013-10-08 NOTE — Telephone Encounter (Signed)
Pt called, says someone called him from our office but did not leave a message. Unsure who called. CB# [phone] / Sherri S.

## 2013-10-08 NOTE — Telephone Encounter (Signed)
I called patient back and informed him that I did not call.  I asked Dr. Posey Pronto and she said that Hinton Dyer had called him.  Will ask Hinton Dyer to call him back.

## 2013-10-12 ENCOUNTER — Ambulatory Visit (INDEPENDENT_AMBULATORY_CARE_PROVIDER_SITE_OTHER): Payer: Medicare Other | Admitting: Neurology

## 2013-10-12 ENCOUNTER — Encounter: Payer: Self-pay | Admitting: Neurology

## 2013-10-12 ENCOUNTER — Ambulatory Visit: Payer: Medicare Other | Admitting: Hematology & Oncology

## 2013-10-12 ENCOUNTER — Other Ambulatory Visit: Payer: Medicare Other

## 2013-10-12 ENCOUNTER — Other Ambulatory Visit: Payer: Medicare Other | Admitting: Lab

## 2013-10-12 VITALS — BP 158/88 | HR 92 | Ht 71.26 in | Wt 182.0 lb

## 2013-10-12 DIAGNOSIS — D472 Monoclonal gammopathy: Secondary | ICD-10-CM

## 2013-10-12 DIAGNOSIS — G6181 Chronic inflammatory demyelinating polyneuritis: Secondary | ICD-10-CM | POA: Diagnosis not present

## 2013-10-12 DIAGNOSIS — G610 Guillain-Barre syndrome: Secondary | ICD-10-CM | POA: Diagnosis not present

## 2013-10-12 NOTE — Patient Instructions (Addendum)
1.  Check 2-hr glucose tolerance test and anti-MAG  2.  Start IVMP 1g x 3 days 3.  Return to clinic in 10-month

## 2013-10-12 NOTE — Progress Notes (Signed)
[facility]  Follow-up Visit   Date: 10/14/2013   Jonathon [name] MRN: [id number] DOB: [birth year]   Interim History: Jonathon [name] is a 78 y.o. year-old right-handed African American male with history of BPH, hypertension, bradycardia s/p pacemaker, and previous tobacco use presenting for follow-up of generalized weakness and muscle atrophy.  The patient was accompanied to the clinic by self.  He was last seen in the office on 09/21/2012.  History of present illness: Patient was in his usual state of health until 2013. He has always been physical active and would go to the gym daily (walking up 10 miles/day). During the spring of 2013, he developed relatively subacute onset of left > right hamstring soreness without an inciting event. Pain initially started in the butt area and radiated down. There is mild soreness of the calf bilaterally. It is exacerbated by prolonged sitting, such as when driving. There is associated proximal leg weakness and loss of muscle mass. He tried doing 50lb squats at the gym to strengthen his quadriceps over the past month but has not noticed any change in his symptoms. Functionally, he is still able to complete all his usual activities, but has became much more cautious when climbing stairs.   - Follow-up 02/01/2013:  He continues to have weakness of his proximal legs, worse on the left. EMG did not show any myopathy or radiculopathy of the left lower extremity.  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  CK, aldolase, B12 was within normal limits. SPEP/UPEP shows monoclonal IgA kappa gammopathy and he underwent extensive hematologic testing, including bone marrow biopsy by Dr. Marin Olp which has been nondiagnostic.    - Follow-up 08/10/2013: Progressive loss of muscle bulk over the upper arms and proximal legs. He has difficulty with climbing stairs and opening jars, and dyspnea on exertion. He feels unsteady at  times, denies any falls. No post-exercise soreness, myalgias, twitches, cramps, neck pain, problems swallowing/ talking or tea-colored urine.  He has occasional back pain.  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   He reports previously drinking 4 beers/day and on the weekends up to 6 beers/day and recently has stopped.   He also reports having 15lb unintentional weight loss and has been eating little.    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  CK is normal, aldolase elevated, and GM1 antibody is negative. There is evidence of severe multilevel spinal stenosis of the cervical spine, and milder findings in the lumbar region.  Clinically, his remains stable without any significant worsening.    - Follow-up 10/12/2013:  He is here to discuss results of his recent CSF testing. CSF studies performed shows increase oligoclonal bands with normal cell count, IgG index, and myelin basic proteins.  He feels that his legs continue to worsen. No interval falls.  He denies any numbness/tingling.     Medications:  Current Outpatient Prescriptions on File Prior to Visit  Medication Sig Dispense Refill  . cholecalciferol (VITAMIN D) 400 UNITS TABS tablet Take 400 Units by mouth daily.       . Cyanocobalamin (VITAMIN B 12 PO) Take 1,000 mg by mouth daily.      Marland Kitchen diltiazem (CARDIZEM LA) 240 MG 24 hr tablet Take 1 tablet (240 mg total) by mouth daily.  90 tablet  3  . Garlic 314 MG CAPS Take 1,000 mg by mouth every morning.       Marland Kitchen  Niacin (VITAMIN B-3 PO) Take by mouth every morning.      . Omega-3 Fatty Acids (FISH OIL) 1000 MG CAPS Take by mouth every morning.      . tamsulosin (FLOMAX) 0.4 MG CAPS capsule Take 1 capsule (0.4 mg total) by mouth daily.  90 capsule  3   No current facility-administered medications on file prior to visit.    Allergies: No Known Allergies   Review of Systems:  CONSTITUTIONAL: No  fevers, chills, night sweats, + loss of appetitie EYES: No visual changes or eye pain ENT: No hearing changes.  No history of nose bleeds.   RESPIRATORY: No cough, wheezing and shortness of breath.   CARDIOVASCULAR: Negative for chest pain, and palpitations.   GI: Negative for abdominal discomfort, blood in stools or black stools.  No recent change in bowel habits.   GU:  No history of incontinence.   MUSCLOSKELETAL: No history of joint pain or swelling.  No myalgias.   SKIN: Negative for lesions, rash, and itching.   HEMATOLOGY/ONCOLOGY: Negative for prolonged bleeding, bruising easily, and swollen nodes.  ENDOCRINE: Negative for cold or heat intolerance, polydipsia or goiter.   PSYCH:  +depression or anxiety symptoms.   NEURO: As Above.   Vital Signs:  BP 158/88  Pulse 92  Ht 5' 11.26" (1.81 m)  Wt 182 lb (82.555 kg)  BMI 25.20 kg/m2  SpO2 98%  Neurological Exam: MENTAL STATUS including orientation to time, place, person, recent and remote memory, attention span and concentration, language, and fund of knowledge is normal.  Speech is not dysarthric.  CRANIAL NERVES:  Pupils are round and reactive to light.  Normal conjugate, extra-ocular eye movements in all directions of gaze. No ptosis.  Face is symmetric.  Palate elevates symmetrically.  Normal shoulder shrug and head rotation.  Normal tongue strength and range of motion, no deviation or fasciculation.  MOTOR:  Bilateral moderate ADM, FDI, ABP and moderate bilateral quadriceps atrophy.  No fasciculations or abnormal movements.  No pronator drift.   Right Upper Extremity:    Left Upper Extremity:    Deltoid  5/5   Deltoid  5/5   Biceps  5/5   Biceps  5/5   Triceps  5/5   Triceps  5/5   Wrist extensors  5/5   Wrist extensors  5/5   Wrist flexors  5/5   Wrist flexors  5/5   Finger extensors  5/5   Finger extensors  5/5   Finger flexors  5/5   Finger flexors  5/5   Dorsal interossei  3+/5   Dorsal interossei  3+/5   Abductor  pollicis  4-/5   Abductor pollicis  4-/5   Tone (Ashworth scale)  0  Tone (Ashworth scale)  0   Right Lower Extremity:    Left Lower Extremity:    Hip flexors  4+/5   Hip flexors  4+/5   Hip extensors  5/5   Hip extensors  5/5   Knee flexors  5/5   Knee flexors  5/5   Knee extensors  5/5   Knee extensors  5/5   Dorsiflexors  5/5   Dorsiflexors  5/5   Plantarflexors  5/5   Plantarflexors  5/5   Toe extensors  5/5   Toe extensors  5/5   Toe flexors  5/5   Toe flexors  5/5   Tone (Ashworth scale)  0  Tone (Ashworth scale)  0   MSRs:  Reflexes are 1+/4 throughout, except absent  at Achilles.  Down-going plantar responses.  SENSORY:  Pin prick is reduced over the left leg.  Vibration also reduced only at left great toe, otherwise intact.   COORDINATION/GAIT: Normal finger-to- nose-finger and heel-to-shin.  Intact rapid alternating movements bilaterally.  Able to rise from a chair without using arms.  Gait narrow based and stable. Tandem and stressed gait intact.  He is able to perform squat and get up without difficulty  Data: Labs 01/13/2013:  CK 55, aldolase 7.9, TSH 0.39, copper 97, ceruloplasmin 30,  Labs 07/2013:  vitamin  B12 519, ANA neg, RF neg, ESR 5 Labs 08/10/2013:  CK 91, aldolase 14.9*, GM1 antibody - neg  CSF 09/24/2013:  R1 W0 G59 Protein 42, IgG index 4.4, MBP < 2.0, WNV - neg, cytology - neg, OCB - 3 well defined bands present in CSF and serum, more prominent in CSF  EMG 01/25/2013:   1. 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  2. Left ulnar neuropathy at the elbow, moderately severe in degree electrically. A superimposed C8 motor radiculopathy cannot be excluded. 3. No evidence of diffuse motor axon loss or a generalized myopathy.   EMG 09/16/2013: NCV & EMG Findings:  Extensive evaluation of the left upper and lower extremities with additional studies of the  mid-thoracic paraspinal (T11 level) muscles reveals:  4. The median sensory response is prolonged (4.2 ms) and reduced in amplitude (8.6 V). The ulnar sensory response is absent. The radial sensory response is normal.  5. Normal sural and superficial peroneal sensory responses. 6. The median motor response is prolonged (4.9 ms) and reduced in amplitude(4.5 mV).. The ulnar motor response is reduced in amplitude (6.0 mV), with evidence of conduction block in the forearm and across the elbow, and has decreased conduction velocity (A Elbow-B Elbow, 20 m/s).  7. The left peroneal motor nerve showed decreased conduction velocity (Poplt-B Fib, 29 m/s). The left tibial motor nerve showed decreased conduction velocity (Knee-Ankle, 33 m/s).  8. F Wave studies indicate that the left tibial F wave has prolonged latency (62.85 ms). All remaining F Wave latencies were within normal limits.  9. H-reflex studies indicate that the left tibial H-reflex has prolonged latency (46.26 ms). The right tibial H-reflex has prolonged latency (39.32 ms).  10. There is evidence of diffuse and generalized chronic motor axon loss changes affecting the aorta the tested muscles of the upper and lower extremities, with accompanied active denervation. No fasciculation potentials are seen.  11. 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 When compared to his previous EMG dated 01/25/2013, there is an interval worsening of findings.  CT cervical spine and lumbar spine 08/17/2013: 1. Advanced degenerative changes in the cervical spine, including multilevel bulky anterior endplate osteophytes.  2. Suspect multifactorial moderate to severe cervical spinal stenosis at the C5-C6 and C6-C7 levels. As the patient is not a   candidate for MRI, cervical spine CT myelogram would confirm.  3. Associated severe multifactorial neural foraminal stenosis at the right C4, right C5, right C6, bilateral C7, and bilateral C8 nerve levels.  4. Mild for age lumbar spine degenerative changes, chiefly facet arthropathy. Up to mild multifactorial spinal stenosis at L2-L3 and L3-L4.    IMPRESSION: Mr. Dooling is a delightful 78  year-old gentleman returning for evaluation of leg weakness and loss of muscle bulk.  He has progressive sensorimotor neuropathy over the past 6-9 months.  On exam, there is marked atrophy and weakness of the intrinsic hand muscles (FDI, ABP,  ADM) and quadriceps, hyporeflexia throughout, and sensation is only mildly reduced over the left leg.  There is no upper motor neuron findings.   Labs including CK, TSH, copper, B12, ESR, RF, ANA, GM1 antibody, and ceruloplasmin are within normal limits.  His aldolase when rechecked is elevated (14.9) and he was found to have monoclonal IgA kappa gammopathy on serum for which he underwent extensive evaluation by Dr. Marin Olp, including bone marrow biopsy which was negative.  He was noted to have iron deficiency anemia and had iron transfusion.  He also has transaminatis likely due to fatty liver.  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  Additionally, there is not enough lumbar spinal stenosis to account for the EMG changes seen in the lumbosacral region.  Electrodiagnostically, there is a progression of axon loss and demyelinating features affecting the left side, most suggestive of an acquired polyradiculoneuropathy.  There is reduction in conduction velocity involving the peroneal, tibial, and ulnar motor nerves, conduction block involving the ulnar nerve in the forearm, and prolonged late  responses. There is no evidence of abnormal temporal dispersion.   He underwent CSF testing which showed normal cell count and protein. 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     With the workup thus far, I have high clinical suspicion for chronic inflammatory demyelinating polyradiculoneuropathy, associated with monoclonal gammopathy of undetermined significance of the IgA kappa type.   I had an extensive discussion with the patient regarding management options including a course of Solu-Medrol. If he responds to the first round, I will start him on a monthly dose. Going forward to, intravenous immunoglobulin may be considered as well as nerve biopsy if diagnosis remains uncertain and there is no clinical improvement. I offered him the opportunity to ask questions and answered them to the best of my ability.  Multifocal motor neuropathy with conduction block is a possibility especially given the insidious course and muscle atrophy which is out of proportion to his weakness, but testing for GM1 antibody returned negative.    Discussed nerve biopsy, but will hold off for now and pursue treatment with steroids.    PLAN/RECOMMENDATIONS:  1.  Check 2-hr glucose tolerance test and anti-MAG  2.  Start IVMP 1g x 3 days 3.  Return to clinic in 77-month  The duration of this appointment visit was 40 minutes of face-to-face time with the patient.  Greater than 50% of this time was spent in counseling, explanation of diagnosis, planning of further management, and coordination of care.   Thank you for allowing me to participate in patient's care.  If I can answer any additional questions, I would be pleased to do so.    Sincerely,    Farha Dano K. PPosey Pronto DO  cc:  Dr. PBluford Kaufmann Dr. PBurney Gauze Dr. JZenovia Jarred

## 2013-10-13 ENCOUNTER — Other Ambulatory Visit: Payer: Self-pay | Admitting: *Deleted

## 2013-10-13 DIAGNOSIS — G610 Guillain-Barre syndrome: Secondary | ICD-10-CM

## 2013-10-13 MED ORDER — METHYLPREDNISOLONE SODIUM SUCC 1000 MG IJ SOLR: 1000.0000 mg | Freq: Every day | INTRAMUSCULAR | Status: AC

## 2013-10-14 ENCOUNTER — Telehealth: Payer: Self-pay | Admitting: *Deleted

## 2013-10-14 ENCOUNTER — Encounter: Payer: Self-pay | Admitting: *Deleted

## 2013-10-14 ENCOUNTER — Ambulatory Visit: Payer: Medicare Other | Admitting: Neurology

## 2013-10-14 NOTE — Telephone Encounter (Signed)
Patient informed of solumedrol infusion appointments.  June 8th and 9th at 10:00 and June 10th at 11:00.   Letter sent also.

## 2013-10-18 ENCOUNTER — Encounter (HOSPITAL_COMMUNITY)
Admission: RE | Admit: 2013-10-18 | Discharge: 2013-10-18 | Disposition: A | Discharge status: Home / Self Care | Payer: Medicare Other | Source: Ambulatory Visit | Attending: Neurology | Admitting: Neurology

## 2013-10-18 ENCOUNTER — Other Ambulatory Visit: Payer: Self-pay | Admitting: *Deleted

## 2013-10-18 DIAGNOSIS — M6281 Muscle weakness (generalized): Secondary | ICD-10-CM | POA: Diagnosis not present

## 2013-10-18 DIAGNOSIS — G610 Guillain-Barre syndrome: Secondary | ICD-10-CM

## 2013-10-18 DIAGNOSIS — M6250 Muscle wasting and atrophy, not elsewhere classified, unspecified site: Secondary | ICD-10-CM | POA: Insufficient documentation

## 2013-10-18 DIAGNOSIS — G608 Other hereditary and idiopathic neuropathies: Secondary | ICD-10-CM | POA: Insufficient documentation

## 2013-10-18 MED ORDER — METHYLPREDNISOLONE SODIUM SUCC 1000 MG IJ SOLR: 1000.0000 mg | Freq: Every day | INTRAMUSCULAR | Status: AC

## 2013-10-18 MED ORDER — SODIUM CHLORIDE 0.9 % IV SOLN
1000.0000 mg | INTRAVENOUS | Status: DC
  Administered 2013-10-18: 1000 mg via INTRAVENOUS
  Filled 2013-10-18: qty 8

## 2013-10-19 ENCOUNTER — Encounter (HOSPITAL_COMMUNITY)
Admission: RE | Admit: 2013-10-19 | Discharge: 2013-10-19 | Disposition: A | Discharge status: Home / Self Care | Payer: Medicare Other | Source: Ambulatory Visit | Attending: Neurology | Admitting: Neurology

## 2013-10-19 DIAGNOSIS — D472 Monoclonal gammopathy: Secondary | ICD-10-CM | POA: Diagnosis not present

## 2013-10-19 DIAGNOSIS — G610 Guillain-Barre syndrome: Secondary | ICD-10-CM | POA: Diagnosis not present

## 2013-10-19 DIAGNOSIS — G608 Other hereditary and idiopathic neuropathies: Secondary | ICD-10-CM | POA: Diagnosis not present

## 2013-10-19 MED ORDER — SODIUM CHLORIDE 0.9 % IV SOLN
1000.0000 mg | INTRAVENOUS | Status: DC
  Administered 2013-10-19: 1000 mg via INTRAVENOUS
  Filled 2013-10-19: qty 8

## 2013-10-20 ENCOUNTER — Encounter (HOSPITAL_COMMUNITY)
Admission: RE | Admit: 2013-10-20 | Discharge: 2013-10-20 | Disposition: A | Discharge status: Home / Self Care | Payer: Medicare Other | Source: Ambulatory Visit | Attending: Neurology | Admitting: Neurology

## 2013-10-20 DIAGNOSIS — G608 Other hereditary and idiopathic neuropathies: Secondary | ICD-10-CM | POA: Diagnosis not present

## 2013-10-20 MED ORDER — SODIUM CHLORIDE 0.9 % IV SOLN
1000.0000 mg | INTRAVENOUS | Status: DC
  Administered 2013-10-20: 1000 mg via INTRAVENOUS
  Filled 2013-10-20: qty 8

## 2013-10-23 LAB — FUNGUS CULTURE W SMEAR: Fungal Smear: NONE SEEN

## 2013-10-27 ENCOUNTER — Ambulatory Visit (HOSPITAL_BASED_OUTPATIENT_CLINIC_OR_DEPARTMENT_OTHER): Payer: Medicare Other | Admitting: Lab

## 2013-10-27 ENCOUNTER — Ambulatory Visit (HOSPITAL_BASED_OUTPATIENT_CLINIC_OR_DEPARTMENT_OTHER): Payer: Medicare Other | Admitting: Hematology & Oncology

## 2013-10-27 VITALS — BP 148/81 | HR 88 | Temp 99.5°F | Resp 20 | Ht 70.0 in | Wt 187.0 lb

## 2013-10-27 DIAGNOSIS — D696 Thrombocytopenia, unspecified: Secondary | ICD-10-CM | POA: Diagnosis not present

## 2013-10-27 DIAGNOSIS — F17200 Nicotine dependence, unspecified, uncomplicated: Secondary | ICD-10-CM | POA: Diagnosis not present

## 2013-10-27 DIAGNOSIS — M6250 Muscle wasting and atrophy, not elsewhere classified, unspecified site: Secondary | ICD-10-CM | POA: Diagnosis not present

## 2013-10-27 DIAGNOSIS — R5383 Other fatigue: Secondary | ICD-10-CM | POA: Diagnosis not present

## 2013-10-27 DIAGNOSIS — Z86010 Personal history of colonic polyps: Secondary | ICD-10-CM | POA: Diagnosis not present

## 2013-10-27 DIAGNOSIS — Z950 Presence of cardiac pacemaker: Secondary | ICD-10-CM | POA: Diagnosis not present

## 2013-10-27 DIAGNOSIS — D509 Iron deficiency anemia, unspecified: Secondary | ICD-10-CM

## 2013-10-27 DIAGNOSIS — L723 Sebaceous cyst: Secondary | ICD-10-CM | POA: Diagnosis not present

## 2013-10-27 DIAGNOSIS — E349 Endocrine disorder, unspecified: Secondary | ICD-10-CM

## 2013-10-27 DIAGNOSIS — I498 Other specified cardiac arrhythmias: Secondary | ICD-10-CM | POA: Diagnosis not present

## 2013-10-27 DIAGNOSIS — E291 Testicular hypofunction: Secondary | ICD-10-CM | POA: Diagnosis not present

## 2013-10-27 DIAGNOSIS — D126 Benign neoplasm of colon, unspecified: Secondary | ICD-10-CM | POA: Diagnosis not present

## 2013-10-27 DIAGNOSIS — M7020 Olecranon bursitis, unspecified elbow: Secondary | ICD-10-CM | POA: Diagnosis not present

## 2013-10-27 DIAGNOSIS — R634 Abnormal weight loss: Secondary | ICD-10-CM | POA: Diagnosis not present

## 2013-10-27 DIAGNOSIS — D472 Monoclonal gammopathy: Secondary | ICD-10-CM | POA: Diagnosis not present

## 2013-10-27 DIAGNOSIS — M6281 Muscle weakness (generalized): Secondary | ICD-10-CM | POA: Diagnosis not present

## 2013-10-27 DIAGNOSIS — R7989 Other specified abnormal findings of blood chemistry: Secondary | ICD-10-CM | POA: Diagnosis not present

## 2013-10-27 DIAGNOSIS — K7460 Unspecified cirrhosis of liver: Secondary | ICD-10-CM

## 2013-10-27 DIAGNOSIS — I10 Essential (primary) hypertension: Secondary | ICD-10-CM | POA: Diagnosis not present

## 2013-10-27 DIAGNOSIS — D649 Anemia, unspecified: Secondary | ICD-10-CM | POA: Diagnosis not present

## 2013-10-27 DIAGNOSIS — R059 Cough, unspecified: Secondary | ICD-10-CM | POA: Diagnosis not present

## 2013-10-27 DIAGNOSIS — R0602 Shortness of breath: Secondary | ICD-10-CM | POA: Diagnosis not present

## 2013-10-27 DIAGNOSIS — R5381 Other malaise: Secondary | ICD-10-CM | POA: Diagnosis not present

## 2013-10-27 DIAGNOSIS — M129 Arthropathy, unspecified: Secondary | ICD-10-CM | POA: Diagnosis not present

## 2013-10-27 DIAGNOSIS — G610 Guillain-Barre syndrome: Secondary | ICD-10-CM | POA: Diagnosis not present

## 2013-10-27 DIAGNOSIS — K644 Residual hemorrhoidal skin tags: Secondary | ICD-10-CM | POA: Diagnosis not present

## 2013-10-27 DIAGNOSIS — D61818 Other pancytopenia: Secondary | ICD-10-CM | POA: Diagnosis not present

## 2013-10-27 DIAGNOSIS — D72819 Decreased white blood cell count, unspecified: Secondary | ICD-10-CM | POA: Diagnosis not present

## 2013-10-27 DIAGNOSIS — E538 Deficiency of other specified B group vitamins: Secondary | ICD-10-CM | POA: Diagnosis not present

## 2013-10-27 LAB — CBC WITH DIFFERENTIAL (CANCER CENTER ONLY)
BASO#: 0 10*3/uL (ref 0.0–0.2)
BASO%: 0.2 % (ref 0.0–2.0)
EOS%: 0.6 % (ref 0.0–7.0)
Eosinophils Absolute: 0 10*3/uL (ref 0.0–0.5)
HCT: 34.5 % — ABNORMAL LOW (ref 38.7–49.9)
HGB: 12 g/dL — ABNORMAL LOW (ref 13.0–17.1)
LYMPH#: 1.6 10*3/uL (ref 0.9–3.3)
LYMPH%: 26 % (ref 14.0–48.0)
MCH: 30.2 pg (ref 28.0–33.4)
MCHC: 34.8 g/dL (ref 32.0–35.9)
MCV: 87 fL (ref 82–98)
MONO#: 1.2 10*3/uL — AB (ref 0.1–0.9)
MONO%: 19.2 % — ABNORMAL HIGH (ref 0.0–13.0)
NEUT%: 54 % (ref 40.0–80.0)
NEUTROS ABS: 3.4 10*3/uL (ref 1.5–6.5)
Platelets: 122 10*3/uL — ABNORMAL LOW (ref 145–400)
RBC: 3.97 10*6/uL — ABNORMAL LOW (ref 4.20–5.70)
RDW: 12.9 % (ref 11.1–15.7)
WBC: 6.3 10*3/uL (ref 4.0–10.0)

## 2013-10-27 LAB — CHCC SATELLITE - SMEAR

## 2013-10-27 LAB — IRON AND TIBC CHCC
%SAT: 24 % (ref 20–55)
Iron: 69 ug/dL (ref 42–163)
TIBC: 285 ug/dL (ref 202–409)
UIBC: 216 ug/dL (ref 117–376)

## 2013-10-27 LAB — FERRITIN CHCC: Ferritin: 512 ng/ml — ABNORMAL HIGH (ref 22–316)

## 2013-10-27 MED ORDER — TESTOSTERONE 50 MG/5GM (1%) TD GEL: 5.0000 g | Freq: Every day | TRANSDERMAL | Status: DC

## 2013-11-01 ENCOUNTER — Telehealth: Payer: Self-pay | Admitting: Hematology & Oncology

## 2013-11-01 NOTE — Progress Notes (Signed)
Hematology and Oncology Follow Up Visit  Jonathon [name] [id number] [birth year] 78 y.o. 11/01/2013   Principle Diagnosis:  Iron deficiency anemia Hypotestosteronemia  Current Therapy:    IV iron as indicated  Testosterone gel     Interim History:  Mr.  [name] is back for followup. He's about the same. He's been followed by neurology. There may be some issue with a demyelination-type syndrome. He is having studies done.  We've done an extensive battery of labs on him. We did a bone marrow test on him. We've not found any obvious hematologic disorder.  I believe that he's had a spinal tap done.  The last time we saw him, his ferritin was 753 with an iron saturation 58%.  His testosterone level is on the low side. He is on testosterone replacement. In April, his testosterone level was 337.  He's had no fever. He's had no bleeding. He's had no cough. He's had no nausea vomiting.  Medications: Current outpatient prescriptions:cholecalciferol (VITAMIN D) 400 UNITS TABS tablet, Take 400 Units by mouth daily. , Disp: , Rfl: ;  Cyanocobalamin (VITAMIN B 12 PO), Take 1,000 mg by mouth daily., Disp: , Rfl: ;  diltiazem (CARDIZEM LA) 240 MG 24 hr tablet, Take 1 tablet (240 mg total) by mouth daily., Disp: 90 tablet, Rfl: 3;  Garlic 354 MG CAPS, Take 1,000 mg by mouth every morning. , Disp: , Rfl:  Niacin (VITAMIN B-3 PO), Take by mouth every morning., Disp: , Rfl: ;  Omega-3 Fatty Acids (FISH OIL) 1000 MG CAPS, Take by mouth every morning., Disp: , Rfl: ;  tamsulosin (FLOMAX) 0.4 MG CAPS capsule, Take 1 capsule (0.4 mg total) by mouth daily., Disp: 90 capsule, Rfl: 3;  testosterone (ANDROGEL) 50 MG/5GM (1%) GEL, Place 5 g onto the skin daily., Disp: 30 Package, Rfl: 3  Allergies: No Known Allergies  Past Medical History, Surgical history, Social history, and Family History were reviewed and updated.  Review of Systems: As above  Physical Exam:  height is 5\' 10"  (1.778 m) and weight is 187 lb  (84.823 kg). His oral temperature is 99.5 F (37.5 C). His blood pressure is 148/81 and his pulse is 88. His respiration is 20.   Well-developed well-nourished Afro-American gentleman. Lungs are clear. Cardiac exam regular rate and rhythm with no murmurs rubs or bruits. Abdomen is soft. He has good bowel sounds. There is no palpable liver or spleen tip. There is no fluid wave. Back exam no tenderness over the spine. Extremities shows no clubbing cyanosis or edema. Muscle strength is about 4/5 in his legs. Skin exam no rashes. Neurological exam nonfocal.  Lab Results  Component Value Date   WBC 6.3 10/27/2013   HGB 12.0* 10/27/2013   HCT 34.5* 10/27/2013   MCV 87 10/27/2013   PLT 122* 10/27/2013     Chemistry      Component Value Date/Time   NA 136 10/27/2013 1327   K 4.0 10/27/2013 1327   CL 104 10/27/2013 1327   CO2 22 10/27/2013 1327   BUN 16 10/27/2013 1327   CREATININE 0.98 10/27/2013 1327      Component Value Date/Time   CALCIUM 8.3* 10/27/2013 1327   ALKPHOS 66 10/27/2013 1327   AST 43* 10/27/2013 1327   ALT 56* 10/27/2013 1327   BILITOT 1.5* 10/27/2013 1327      Ferritin is 512. Iron saturation 24. Total iron 69.   Impression and Plan: Jonathon [name] is 78 year old gentleman. He has this underlying neurological disorder. Again  is being followed closely by neurology.  His iron is dropping again. We will go ahead and plan for an iron infusion. IV iron always works. Even though he is not that anemic, I think I will help.  I'll plan to get him back in another couple months. Volanda Napoleon, MD 6/22/20157:04 AM

## 2013-11-01 NOTE — Telephone Encounter (Signed)
Pt aware of 6-25 iron infusion

## 2013-11-02 ENCOUNTER — Other Ambulatory Visit: Payer: Medicare Other

## 2013-11-02 DIAGNOSIS — G610 Guillain-Barre syndrome: Secondary | ICD-10-CM

## 2013-11-02 DIAGNOSIS — D472 Monoclonal gammopathy: Secondary | ICD-10-CM

## 2013-11-02 LAB — PROTEIN ELECTROPHORESIS, SERUM, WITH REFLEX
Albumin ELP: 51.4 % — ABNORMAL LOW (ref 55.8–66.1)
Alpha-1-Globulin: 4.6 % (ref 2.9–4.9)
Alpha-2-Globulin: 11.2 % (ref 7.1–11.8)
BETA 2: 9.5 % — AB (ref 3.2–6.5)
Beta Globulin: 7.1 % (ref 4.7–7.2)
Gamma Globulin: 16.2 % (ref 11.1–18.8)
Total Protein, Serum Electrophoresis: 6.2 g/dL (ref 6.0–8.3)

## 2013-11-02 LAB — GLUCOSE TOLERANCE, 2 HOURS
Glucose, 1 Hour GTT: 127 mg/dL
Glucose, 2 hour: 146 mg/dL
Glucose, Fasting: 113 mg/dL — ABNORMAL HIGH (ref 70–99)

## 2013-11-02 LAB — COMPREHENSIVE METABOLIC PANEL
ALK PHOS: 66 U/L (ref 39–117)
ALT: 56 U/L — ABNORMAL HIGH (ref 0–53)
AST: 43 U/L — AB (ref 0–37)
Albumin: 3.4 g/dL — ABNORMAL LOW (ref 3.5–5.2)
BILIRUBIN TOTAL: 1.5 mg/dL — AB (ref 0.2–1.2)
BUN: 16 mg/dL (ref 6–23)
CO2: 22 mEq/L (ref 19–32)
CREATININE: 0.98 mg/dL (ref 0.50–1.35)
Calcium: 8.3 mg/dL — ABNORMAL LOW (ref 8.4–10.5)
Chloride: 104 mEq/L (ref 96–112)
Glucose, Bld: 129 mg/dL — ABNORMAL HIGH (ref 70–99)
Potassium: 4 mEq/L (ref 3.5–5.3)
SODIUM: 136 meq/L (ref 135–145)
TOTAL PROTEIN: 6.2 g/dL (ref 6.0–8.3)

## 2013-11-02 LAB — KAPPA/LAMBDA LIGHT CHAINS
KAPPA FREE LGHT CHN: 2.36 mg/dL — AB (ref 0.33–1.94)
KAPPA LAMBDA RATIO: 0.79 (ref 0.26–1.65)
LAMBDA FREE LGHT CHN: 3 mg/dL — AB (ref 0.57–2.63)

## 2013-11-02 LAB — IGG, IGA, IGM
IGG (IMMUNOGLOBIN G), SERUM: 1020 mg/dL (ref 650–1600)
IgA: 504 mg/dL — ABNORMAL HIGH (ref 68–379)
IgM, Serum: 105 mg/dL (ref 41–251)

## 2013-11-02 LAB — IFE INTERPRETATION

## 2013-11-04 ENCOUNTER — Ambulatory Visit (HOSPITAL_BASED_OUTPATIENT_CLINIC_OR_DEPARTMENT_OTHER): Payer: Medicare Other

## 2013-11-04 VITALS — BP 153/87 | HR 82 | Temp 98.3°F | Resp 20

## 2013-11-04 DIAGNOSIS — M6281 Muscle weakness (generalized): Secondary | ICD-10-CM | POA: Diagnosis not present

## 2013-11-04 DIAGNOSIS — D696 Thrombocytopenia, unspecified: Secondary | ICD-10-CM | POA: Diagnosis not present

## 2013-11-04 DIAGNOSIS — D61818 Other pancytopenia: Secondary | ICD-10-CM | POA: Diagnosis not present

## 2013-11-04 DIAGNOSIS — R0602 Shortness of breath: Secondary | ICD-10-CM | POA: Diagnosis not present

## 2013-11-04 DIAGNOSIS — I10 Essential (primary) hypertension: Secondary | ICD-10-CM | POA: Diagnosis not present

## 2013-11-04 DIAGNOSIS — L723 Sebaceous cyst: Secondary | ICD-10-CM | POA: Diagnosis not present

## 2013-11-04 DIAGNOSIS — I498 Other specified cardiac arrhythmias: Secondary | ICD-10-CM | POA: Diagnosis not present

## 2013-11-04 DIAGNOSIS — R634 Abnormal weight loss: Secondary | ICD-10-CM | POA: Diagnosis not present

## 2013-11-04 DIAGNOSIS — D126 Benign neoplasm of colon, unspecified: Secondary | ICD-10-CM | POA: Diagnosis not present

## 2013-11-04 DIAGNOSIS — K644 Residual hemorrhoidal skin tags: Secondary | ICD-10-CM | POA: Diagnosis not present

## 2013-11-04 DIAGNOSIS — R059 Cough, unspecified: Secondary | ICD-10-CM | POA: Diagnosis not present

## 2013-11-04 DIAGNOSIS — D72819 Decreased white blood cell count, unspecified: Secondary | ICD-10-CM | POA: Diagnosis not present

## 2013-11-04 DIAGNOSIS — M6250 Muscle wasting and atrophy, not elsewhere classified, unspecified site: Secondary | ICD-10-CM | POA: Diagnosis not present

## 2013-11-04 DIAGNOSIS — R7989 Other specified abnormal findings of blood chemistry: Secondary | ICD-10-CM | POA: Diagnosis not present

## 2013-11-04 DIAGNOSIS — E538 Deficiency of other specified B group vitamins: Secondary | ICD-10-CM | POA: Diagnosis not present

## 2013-11-04 DIAGNOSIS — M7020 Olecranon bursitis, unspecified elbow: Secondary | ICD-10-CM | POA: Diagnosis not present

## 2013-11-04 DIAGNOSIS — R5381 Other malaise: Secondary | ICD-10-CM | POA: Diagnosis not present

## 2013-11-04 DIAGNOSIS — Z950 Presence of cardiac pacemaker: Secondary | ICD-10-CM | POA: Diagnosis not present

## 2013-11-04 DIAGNOSIS — R5383 Other fatigue: Secondary | ICD-10-CM | POA: Diagnosis not present

## 2013-11-04 DIAGNOSIS — D649 Anemia, unspecified: Secondary | ICD-10-CM | POA: Diagnosis not present

## 2013-11-04 DIAGNOSIS — D509 Iron deficiency anemia, unspecified: Secondary | ICD-10-CM | POA: Diagnosis not present

## 2013-11-04 DIAGNOSIS — D472 Monoclonal gammopathy: Secondary | ICD-10-CM | POA: Diagnosis not present

## 2013-11-04 DIAGNOSIS — F17200 Nicotine dependence, unspecified, uncomplicated: Secondary | ICD-10-CM | POA: Diagnosis not present

## 2013-11-04 DIAGNOSIS — Z86010 Personal history of colonic polyps: Secondary | ICD-10-CM | POA: Diagnosis not present

## 2013-11-04 DIAGNOSIS — G610 Guillain-Barre syndrome: Secondary | ICD-10-CM | POA: Diagnosis not present

## 2013-11-04 DIAGNOSIS — M129 Arthropathy, unspecified: Secondary | ICD-10-CM | POA: Diagnosis not present

## 2013-11-04 MED ORDER — SODIUM CHLORIDE 0.9 % IV SOLN
510.0000 mg | Freq: Once | INTRAVENOUS | Status: AC
  Administered 2013-11-04: 510 mg via INTRAVENOUS
  Filled 2013-11-04: qty 17

## 2013-11-04 MED ORDER — SODIUM CHLORIDE 0.9 % IV SOLN
Freq: Once | INTRAVENOUS | Status: AC
  Administered 2013-11-04: 11:00:00 via INTRAVENOUS

## 2013-11-04 NOTE — Patient Instructions (Signed)

## 2013-11-05 ENCOUNTER — Encounter: Payer: Self-pay | Admitting: Internal Medicine

## 2013-11-05 ENCOUNTER — Ambulatory Visit (INDEPENDENT_AMBULATORY_CARE_PROVIDER_SITE_OTHER): Payer: Medicare Other | Admitting: Internal Medicine

## 2013-11-05 VITALS — BP 150/90 | HR 82 | Temp 98.3°F | Resp 20 | Ht 70.0 in | Wt 187.0 lb

## 2013-11-05 DIAGNOSIS — R7302 Impaired glucose tolerance (oral): Secondary | ICD-10-CM

## 2013-11-05 DIAGNOSIS — G610 Guillain-Barre syndrome: Secondary | ICD-10-CM

## 2013-11-05 DIAGNOSIS — R600 Localized edema: Secondary | ICD-10-CM

## 2013-11-05 DIAGNOSIS — R609 Edema, unspecified: Secondary | ICD-10-CM | POA: Diagnosis not present

## 2013-11-05 DIAGNOSIS — R7309 Other abnormal glucose: Secondary | ICD-10-CM

## 2013-11-05 DIAGNOSIS — I10 Essential (primary) hypertension: Secondary | ICD-10-CM

## 2013-11-05 LAB — CBC WITH DIFFERENTIAL/PLATELET
BASOS PCT: 0.5 % (ref 0.0–3.0)
Basophils Absolute: 0 10*3/uL (ref 0.0–0.1)
EOS PCT: 3.1 % (ref 0.0–5.0)
Eosinophils Absolute: 0.1 10*3/uL (ref 0.0–0.7)
HCT: 36.9 % — ABNORMAL LOW (ref 39.0–52.0)
HEMOGLOBIN: 12.5 g/dL — AB (ref 13.0–17.0)
LYMPHS PCT: 35.9 % (ref 12.0–46.0)
Lymphs Abs: 1.6 10*3/uL (ref 0.7–4.0)
MCHC: 33.8 g/dL (ref 30.0–36.0)
MCV: 89.1 fl (ref 78.0–100.0)
MONOS PCT: 13.3 % — AB (ref 3.0–12.0)
Monocytes Absolute: 0.6 10*3/uL (ref 0.1–1.0)
NEUTROS ABS: 2.1 10*3/uL (ref 1.4–7.7)
Neutrophils Relative %: 47.2 % (ref 43.0–77.0)
Platelets: 262 10*3/uL (ref 150.0–400.0)
RBC: 4.15 Mil/uL — AB (ref 4.22–5.81)
RDW: 14 % (ref 11.5–15.5)
WBC: 4.4 10*3/uL (ref 4.0–10.5)

## 2013-11-05 LAB — HEMOGLOBIN A1C: Hgb A1c MFr Bld: 6 % (ref 4.6–6.5)

## 2013-11-05 LAB — URIC ACID: URIC ACID, SERUM: 6.4 mg/dL (ref 4.0–7.8)

## 2013-11-05 NOTE — Progress Notes (Signed)
Subjective:    Patient ID: Jonathon [name], male    DOB: 1935/08/04, 78 y.o.   MRN: [id number]  HPI  78 year old complex medical patient has problems include poly-radiculoneuropathy elevated liver function studies and a history of pancytopenia.  He presents with a one-week history of swelling of the right foot.  No history of gout.  He does describe some tenderness of the left first MTP joint.  No history of trauma.  Pain is very minimal.  Past Medical History  Diagnosis Date  . BENIGN PROSTATIC HYPERTROPHY, HX OF 10/25/2008  . BRADYCARDIA 10/25/2008  . HYPERTENSION 11/21/2006  . 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 PACEMAKER, PERMANENT 10/25/2008  . TOBACCO ABUSE 10/25/2008    Quit 2012  . Iron deficiency anemia, unspecified 04/19/2013  . Asthma     History   Social History  . Marital Status: Divorced    Spouse Name: N/A    Number of Children: 72  . Years of Education: N/A   Occupational History  . ENVIROMENTAL SERVICE    Social History Main Topics  . Smoking status: Former Smoker -- 0.25 packs/day for 46 years    Types: Cigarettes    Start date: 03/16/1965    Quit date: 06/15/2010  . Smokeless tobacco: Never Used     Comment: quit 3 years ago0  . Alcohol Use: 0.0 oz/week     Comment: 4 beers per day  . Drug Use: No  . Sexual Activity: Not on file   Other Topics Concern  . 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    Past Surgical History  Procedure Laterality Date  . Pacemaker insertion  2005  . Colonoscopy  5102,5852  . Skin graft Right 1962    wrist    Family History  Problem Relation Age of Onset  . Breast cancer Sister     Living, 52  . Diabetes type II Brother     Living, 10  . Breast cancer Sister     Living, 67  . Heart attack Father     Died, 31  . Kidney disease Father   . Parkinson's disease Mother     Died, 95  . Diabetes Daughter      Living, 63  . Diabetes Son     Living, 83  . Colon cancer Neg Hx   . Rectal cancer Neg Hx   . Stomach cancer Neg Hx   . Ovarian cancer Daughter     No Known Allergies  Current Outpatient Prescriptions on File Prior to Visit  Medication Sig Dispense Refill  . cholecalciferol (VITAMIN D) 400 UNITS TABS tablet Take 400 Units by mouth daily.       . Cyanocobalamin (VITAMIN B 12 PO) Take 1,000 mg by mouth daily.      Marland Kitchen diltiazem (CARDIZEM LA) 240 MG 24 hr tablet Take 1 tablet (240 mg total) by mouth daily.  90 tablet  3  . Garlic 778 MG CAPS Take 1,000 mg by mouth every morning.       . Niacin (VITAMIN B-3 PO) Take by mouth every morning.      . Omega-3 Fatty Acids (FISH OIL) 1000 MG CAPS Take by mouth every morning.      . tamsulosin (FLOMAX) 0.4 MG CAPS capsule Take 1 capsule (0.4 mg total) by mouth daily.  90 capsule  3  .  testosterone (ANDROGEL) 50 MG/5GM (1%) GEL Place 5 g onto the skin daily.  30 Package  3   No current facility-administered medications on file prior to visit.    BP 150/90  Pulse 82  Temp(Src) 98.3 F (36.8 C) (Oral)  Resp 20  Ht 5\' 10"  (1.778 m)  Wt 187 lb (84.823 kg)  BMI 26.83 kg/m2  SpO2 99%       Review of Systems  Constitutional: Negative for fever, chills, appetite change and fatigue.  HENT: Negative for congestion, dental problem, ear pain, hearing loss, sore throat, tinnitus, trouble swallowing and voice change.   Eyes: Negative for pain, discharge and visual disturbance.  Respiratory: Negative for cough, chest tightness, wheezing and stridor.   Cardiovascular: Positive for leg swelling. Negative for chest pain and palpitations.  Gastrointestinal: Negative for nausea, vomiting, abdominal pain, diarrhea, constipation, blood in stool and abdominal distention.  Genitourinary: Negative for urgency, hematuria, flank pain, discharge, difficulty urinating and genital sores.  Musculoskeletal: Negative for arthralgias, back pain, gait problem,  joint swelling, myalgias and neck stiffness.  Skin: Negative for rash.  Neurological: Negative for dizziness, syncope, speech difficulty, weakness, numbness and headaches.  Hematological: Negative for adenopathy. Does not bruise/bleed easily.  Psychiatric/Behavioral: Negative for behavioral problems and dysphoric mood. The patient is not nervous/anxious.        Objective:   Physical Exam  Constitutional: He is oriented to person, place, and time. He appears well-developed.  HENT:  Head: Normocephalic.  Right Ear: External ear normal.  Left Ear: External ear normal.  Eyes: Conjunctivae and EOM are normal.  Neck: Normal range of motion.  Cardiovascular: Normal rate and normal heart sounds.   Pulmonary/Chest: Breath sounds normal.  Abdominal: Bowel sounds are normal.  Liver edge palpable on inspiration  Musculoskeletal: Normal range of motion. He exhibits edema. He exhibits no tenderness.  Edema in his changes of the distal right lower leg right ankle and dorsal aspect of the foot; mild tenderness of the MTP joint.  Slight excess of warmth along the areas of soft tissue swelling  Neurological: He is alert and oriented to person, place, and time.  Psychiatric: He has a normal mood and affect. His behavior is normal.          Assessment & Plan:    right distal lower leg and foot edema.  We'll check a d-dimer, and uric acid level.  We'll treat with anti-inflammatories elevation cold compresses and observe Hypertension stable

## 2013-11-05 NOTE — Progress Notes (Signed)
Pre-visit discussion using our clinic review tool. No additional management support is needed unless otherwise documented below in the visit note.  

## 2013-11-05 NOTE — Patient Instructions (Signed)
Limit your sodium (Salt) intake  Keep right leg elevated as much as possible  Continue cold compresses   Take Aleve 200 mg (2)  twice daily for pain or swelling  Call or return to clinic prn if these symptoms worsen or fail to improve as anticipated.

## 2013-11-06 LAB — D-DIMER, QUANTITATIVE: D-Dimer, Quant: 1.21 ug/mL-FEU — ABNORMAL HIGH (ref 0.00–0.48)

## 2013-11-09 ENCOUNTER — Encounter: Payer: Self-pay | Admitting: Internal Medicine

## 2013-11-09 ENCOUNTER — Ambulatory Visit (INDEPENDENT_AMBULATORY_CARE_PROVIDER_SITE_OTHER): Payer: Medicare Other | Admitting: Neurology

## 2013-11-09 ENCOUNTER — Encounter: Payer: Self-pay | Admitting: Neurology

## 2013-11-09 VITALS — BP 148/88 | HR 95 | Ht 71.65 in | Wt 183.5 lb

## 2013-11-09 DIAGNOSIS — G63 Polyneuropathy in diseases classified elsewhere: Secondary | ICD-10-CM

## 2013-11-09 DIAGNOSIS — G6181 Chronic inflammatory demyelinating polyneuritis: Secondary | ICD-10-CM

## 2013-11-09 DIAGNOSIS — D472 Monoclonal gammopathy: Secondary | ICD-10-CM

## 2013-11-09 NOTE — Patient Instructions (Signed)
1.  Check blood testing today 2.  We will call you with appointments for steroid infusions 3.  Maintain active, healthy lifestyle 4.  Try to cut back on your alcohol consumption 5.  Return to clinic in 3 months

## 2013-11-09 NOTE — Progress Notes (Signed)
[facility]  Follow-up Visit   Date: 11/09/2013   Jonathon [name] MRN: [id number] DOB: [birth year]   Interim History: Jonathon [name] is a 78 y.o. year-old right-handed African American male with history of BPH, hypertension, bradycardia s/p pacemaker, and previous tobacco use presenting for follow-up of generalized weakness and muscle atrophy.  The patient was accompanied to the clinic by self.  He was last seen in the office on 10/12/2013.  History of present illness: Patient was in his usual state of health until 2013. He has always been physical active and would go to the gym daily (walking up 10 miles/day). During the spring of 2013, he developed relatively subacute onset of left > right hamstring soreness without an inciting event. Pain initially started in the butt area and radiated down. There is mild soreness of the calf bilaterally. It is exacerbated by prolonged sitting, such as when driving. There is associated proximal leg weakness and loss of muscle mass. He tried doing 50lb squats at the gym to strengthen his quadriceps over the past month but has not noticed any change in his symptoms. Functionally, he is still able to complete all his usual activities, but has became much more cautious when climbing stairs.   - Follow-up 02/01/2013:  He continues to have weakness of his proximal legs, worse on the left. EMG did not show any myopathy or radiculopathy of the left lower extremity.  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  CK, aldolase, B12 was within normal limits. SPEP/UPEP shows monoclonal IgA kappa gammopathy and he underwent extensive hematologic testing, including bone marrow biopsy by Dr. Marin Olp which has been nondiagnostic.    - Follow-up 08/10/2013: Progressive loss of muscle bulk over the upper arms and proximal legs. He has difficulty with climbing stairs and opening jars, and dyspnea on exertion. He feels unsteady at  times, denies any falls. No post-exercise soreness, myalgias, twitches, cramps, neck pain, problems swallowing/ talking or tea-colored urine.  He has occasional back pain.  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   He reports previously drinking 4 beers/day and on the weekends up to 6 beers/day and recently has stopped.   He also reports having 15lb unintentional weight loss and has been eating little.    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  CK is normal, aldolase elevated, and GM1 antibody is negative. There is evidence of severe multilevel spinal stenosis of the cervical spine, and milder findings in the lumbar region.  Clinically, his remains stable without any significant worsening.    - Follow-up 10/12/2013:  He is here to discuss results of his recent CSF testing. CSF studies performed shows increase oligoclonal bands with normal cell count, IgG index, and myelin basic proteins.  He feels that his legs continue to worsen.     UPDATE 11/09/2013:  He completed 3-days of solumedrol (6/8/-6/10) and since starting steroids, his muscle soreness has significantly improved.  There has been no worsening or notable change in muscle strength.  His appetite is better and he gained 5lb over the past month.  Mood remains good.  He is still drinking 2 beers/nightly and 6 beers/d on the weekends.  He has no interval falls.  Denies any numbness/tingling.  Anti-MAG returned negative.   Medications:  Current Outpatient Prescriptions on File Prior to Visit  Medication Sig Dispense Refill  . cholecalciferol (VITAMIN D) 400  UNITS TABS tablet Take 400 Units by mouth daily.       . Cyanocobalamin (VITAMIN B 12 PO) Take 1,000 mg by mouth daily.      Marland Kitchen diltiazem (CARDIZEM LA) 240 MG 24 hr tablet Take 1 tablet (240 mg total) by mouth daily.  90 tablet  3  . Garlic 353 MG CAPS Take 1,000 mg by mouth every morning.       .  Niacin (VITAMIN B-3 PO) Take by mouth every morning.      . Omega-3 Fatty Acids (FISH OIL) 1000 MG CAPS Take by mouth every morning.      . tamsulosin (FLOMAX) 0.4 MG CAPS capsule Take 1 capsule (0.4 mg total) by mouth daily.  90 capsule  3  . testosterone (ANDROGEL) 50 MG/5GM (1%) GEL Place 5 g onto the skin daily.  30 Package  3   No current facility-administered medications on file prior to visit.    Allergies: No Known Allergies   Review of Systems:  CONSTITUTIONAL: No fevers, chills, night sweats EYES: No visual changes or eye pain ENT: No hearing changes.  No history of nose bleeds.   RESPIRATORY: No cough, wheezing and shortness of breath.   CARDIOVASCULAR: Negative for chest pain, and palpitations.   GI: Negative for abdominal discomfort, blood in stools or black stools.  No recent change in bowel habits.   GU:  No history of incontinence.   MUSCLOSKELETAL: No history of joint pain or swelling.  No myalgias.   SKIN: Negative for lesions, rash, and itching.   HEMATOLOGY/ONCOLOGY: Negative for prolonged bleeding, bruising easily, and swollen nodes.  ENDOCRINE: Negative for cold or heat intolerance, polydipsia or goiter.   PSYCH:  +depression or anxiety symptoms.   NEURO: As Above.   Vital Signs:  BP 148/88  Pulse 95  Ht 5' 11.65" (1.82 m)  Wt 183 lb 8 oz (83.235 kg)  BMI 25.13 kg/m2  SpO2 98%  Neurological Exam: MENTAL STATUS including orientation to time, place, person, recent and remote memory, attention span and concentration, language, and fund of knowledge is normal.  Speech is not dysarthric.  CRANIAL NERVES:  Pupils are round and reactive to light.  Normal conjugate, extra-ocular eye movements in all directions of gaze. No ptosis.  Face is symmetric.  Palate elevates symmetrically.  Normal shoulder shrug and head rotation.  Normal tongue strength and range of motion, no deviation or fasciculation.  MOTOR:  Bilateral moderate ADM, FDI, ABP and moderate bilateral  quadriceps atrophy.  No fasciculations.  Mild resting head tremor.  No pronator drift.  Neck flexion and extension is 5/5  Right Upper Extremity:    Left Upper Extremity:    Deltoid  5/5   Deltoid  5/5   Biceps  5/5   Biceps  5/5   Triceps  5/5   Triceps  5/5   Wrist extensors  5/5   Wrist extensors  5/5   Wrist flexors  5/5   Wrist flexors  5/5   Finger extensors  5/5   Finger extensors  5/5   Finger flexors  5/5   Finger flexors  5/5   Dorsal interossei  4/5   Dorsal interossei  4/5   Abductor pollicis  4+/5   Abductor pollicis  4+/5   Tone (Ashworth scale)  0  Tone (Ashworth scale)  0   Right Lower Extremity:    Left Lower Extremity:    Hip flexors  4+/5   Hip flexors  4+/5  Hip extensors  5/5   Hip extensors  5/5   Knee flexors  5/5   Knee flexors  5/5   Knee extensors  5/5   Knee extensors  5/5   Dorsiflexors  5/5   Dorsiflexors  5/5   Plantarflexors  5/5   Plantarflexors  5/5   Toe extensors  5/5   Toe extensors  5/5   Toe flexors  5/5   Toe flexors  5/5   Tone (Ashworth scale)  0  Tone (Ashworth scale)  0   MSRs:  Reflexes are 2+/4 in upper extremities, 1+ patella, and absent at Achilles.  Down-going plantar responses.  SENSORY:  Vibration and temperature reduced distal to right ankle.  Pin prick intact throughout.    COORDINATION/GAIT: Normal finger-to- nose-finger and heel-to-shin.  Intact rapid alternating movements bilaterally.  Able to rise from a chair without using arms.  Gait narrow based and stable. Tandem and stressed gait intact.  He is able to perform squat and get up without difficulty  Data: Labs 01/13/2013:  CK 55, aldolase 7.9, TSH 0.39, copper 97, ceruloplasmin 30,  Labs 07/2013:  vitamin  B12 519, ANA neg, RF neg, ESR 5 Labs 08/10/2013:  CK 91, aldolase 14.9*, GM1 antibody - neg  CSF 09/24/2013:  R1 W0 G59 Protein 42, IgG index 4.4, MBP < 2.0, WNV - neg, cytology - neg, OCB - 3 well defined bands present in CSF and serum, more prominent in CSF  EMG  01/25/2013:   1. 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  2. Left ulnar neuropathy at the elbow, moderately severe in degree electrically. A superimposed C8 motor radiculopathy cannot be excluded. 3. No evidence of diffuse motor axon loss or a generalized myopathy.   EMG 09/16/2013: NCV & EMG Findings:  Extensive evaluation of the left upper and lower extremities with additional studies of the mid-thoracic paraspinal (T11 level) muscles reveals:  1.  The median sensory response is prolonged (4.2 ms) and reduced in amplitude (8.6 V). The ulnar sensory response is absent. The radial sensory response is normal.  2.  Normal sural and superficial peroneal sensory responses. 3.  The median motor response is prolonged (4.9 ms) and reduced in amplitude(4.5 mV).. The ulnar motor response is reduced in amplitude (6.0 mV), with evidence of conduction block in the forearm and across the elbow, and has decreased conduction velocity (A Elbow-B Elbow, 20 m/s).  4.  The left peroneal motor nerve showed decreased conduction velocity (Poplt-B Fib, 29 m/s). The left tibial motor nerve showed decreased conduction velocity (Knee-Ankle, 33 m/s).  5.  F Wave studies indicate that the left tibial F wave has prolonged latency (62.85 ms). All remaining F Wave latencies were within normal limits.  6.  H-reflex studies indicate that the left tibial H-reflex has prolonged latency (46.26 ms). The right tibial H-reflex has prolonged latency (39.32 ms).  7.  There is evidence of diffuse and generalized chronic motor axon loss changes affecting the aorta the tested muscles of the upper and lower extremities, with accompanied active denervation. No fasciculation potentials are seen.  8.  There is no evidence of active denervation affecting the thoracic paraspinal muscle at T11  Impression:  There is electrophysiological  evidence of a chronic generalized sensorimotor polyradiculoneuropathy, axonal loss and demyelinating in type, affecting the left side. When compared to his previous EMG dated 01/25/2013, there is an interval worsening of findings.  CT cervical spine and lumbar spine 08/17/2013: 1. Advanced degenerative changes in the cervical spine, including multilevel bulky anterior endplate osteophytes.  2. Suspect multifactorial moderate to severe cervical spinal stenosis at the C5-C6 and C6-C7 levels. As the patient is not a  candidate for MRI, cervical spine CT myelogram would confirm.  3. Associated severe multifactorial neural foraminal stenosis at the right C4, right C5, right C6, bilateral C7, and bilateral C8 nerve levels.  4. Mild for age lumbar spine degenerative changes, chiefly facet arthropathy. Up to mild multifactorial spinal stenosis at L2-L3 and L3-L4.    IMPRESSION: Mr. Viegas is a delightful 78 year-old gentleman returning for evaluation of leg weakness and loss of muscle bulk.  He has progressive sensorimotor neuropathy over the past 9 months.  Labs including CK, TSH, copper, B12, ESR, RF, ANA, GM1 antibody, ceruloplasmin, and anti-MAG are within normal limits.  His aldolase when rechecked is elevated (14.9) and he was found to have monoclonal IgA kappa gammopathy on serum for which he underwent extensive evaluation by Dr. Marin Olp, including bone marrow biopsy which was negative.  He was noted to have iron deficiency anemia and had iron transfusion.  He also has transaminatis likely due to fatty liver.  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  Additionally, there is not enough lumbar spinal stenosis to account for the EMG changes seen in the lumbosacral region.  Electrodiagnostically, there is a progression of axon  loss and demyelinating features affecting the left side, most suggestive of an acquired polyradiculoneuropathy.  There is reduction in conduction velocity involving the peroneal, tibial, and ulnar motor nerves, conduction block involving the ulnar nerve in the forearm, and prolonged late responses. There is no evidence of abnormal temporal dispersion.   He underwent CSF testing which showed normal cell count and protein. 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     With the workup thus far, I have high clinical suspicion for chronic inflammatory demyelinating polyradiculoneuropathy, associated with monoclonal gammopathy of undetermined significance of the IgA kappa type.  He has underwent solumedrol 1g x 3 days in early June and reports improvement of muscle soreness.  On exam, there is subtle improvement in intrinsic hand weakness and reflexes are normal in the upper extremities, which is improved from previously.  He continues to have marked atrophy of the hands and quadriceps. There is no upper motor neuron findings.   Since there has been improvement with first course of steroids, I will continue solumedrol 1g monthly and follow him closely.    Other medical co-morbidities include MGUS and transaminitis.  For completeness, I will check VEGF for POEMS syndrome since he has multiple organ systems with various abnormalities.      PLAN/RECOMMENDATIONS:  1.  Check VEGF 2.  Start IVMP 1g monthly for at least 6 months 3.  Recommended reducing alcohol intake 4.  Encouraged to maintain active, healthy lifestyle 5.  Return to clinic in 88-month   The duration of this appointment visit was 30 minutes of face-to-face time with the patient.  Greater than 50% of this time was spent in counseling, explanation of diagnosis, planning  of further management, and coordination of care.   Thank you for allowing me to participate in patient's care.  If  I can answer any additional questions, I would be pleased to do so.    Sincerely,    Donika K. Posey Pronto, DO

## 2013-11-16 ENCOUNTER — Other Ambulatory Visit: Payer: Self-pay | Admitting: Internal Medicine

## 2013-11-17 LAB — VASCULAR ENDOTHELIAL GROWTH FACTOR

## 2013-11-23 ENCOUNTER — Other Ambulatory Visit: Payer: Self-pay | Admitting: *Deleted

## 2013-11-23 ENCOUNTER — Telehealth: Payer: Self-pay | Admitting: *Deleted

## 2013-11-23 DIAGNOSIS — G610 Guillain-Barre syndrome: Secondary | ICD-10-CM

## 2013-11-23 MED ORDER — METHYLPREDNISOLONE SODIUM SUCC 1000 MG IJ SOLR: 1000.0000 mg | Freq: Once | INTRAMUSCULAR | Status: DC

## 2013-11-23 MED ORDER — METHYLPREDNISOLONE SODIUM SUCC 1000 MG IJ SOLR: 1000.0000 mg | Freq: Every day | INTRAMUSCULAR | Status: DC

## 2013-11-23 NOTE — Telephone Encounter (Signed)
Patient notified that I have put the order in for solumedrol infusions.  [name] from short stay asked for me to have patient call to set up appointment times.  Patient given number and will call.

## 2013-12-01 ENCOUNTER — Other Ambulatory Visit: Payer: Self-pay | Admitting: *Deleted

## 2013-12-01 ENCOUNTER — Encounter (HOSPITAL_COMMUNITY)
Admission: RE | Admit: 2013-12-01 | Discharge: 2013-12-01 | Disposition: A | Discharge status: Home / Self Care | Payer: Medicare Other | Source: Ambulatory Visit | Attending: Neurology | Admitting: Neurology

## 2013-12-01 DIAGNOSIS — G608 Other hereditary and idiopathic neuropathies: Secondary | ICD-10-CM | POA: Diagnosis not present

## 2013-12-01 DIAGNOSIS — M6281 Muscle weakness (generalized): Secondary | ICD-10-CM | POA: Insufficient documentation

## 2013-12-01 DIAGNOSIS — M6250 Muscle wasting and atrophy, not elsewhere classified, unspecified site: Secondary | ICD-10-CM | POA: Insufficient documentation

## 2013-12-01 DIAGNOSIS — G379 Demyelinating disease of central nervous system, unspecified: Secondary | ICD-10-CM

## 2013-12-01 MED ORDER — SODIUM CHLORIDE 0.9 % IV SOLN
1000.0000 mg | INTRAVENOUS | Status: DC
  Administered 2013-12-01: 1000 mg via INTRAVENOUS
  Filled 2013-12-01: qty 8

## 2013-12-01 MED ORDER — METHYLPREDNISOLONE SODIUM SUCC 1000 MG IJ SOLR: 1000.0000 mg | Freq: Once | INTRAMUSCULAR | Status: DC

## 2013-12-22 ENCOUNTER — Ambulatory Visit (INDEPENDENT_AMBULATORY_CARE_PROVIDER_SITE_OTHER): Payer: Medicare Other | Admitting: *Deleted

## 2013-12-22 DIAGNOSIS — I495 Sick sinus syndrome: Secondary | ICD-10-CM | POA: Diagnosis not present

## 2013-12-22 DIAGNOSIS — I498 Other specified cardiac arrhythmias: Secondary | ICD-10-CM

## 2013-12-22 LAB — MDC_IDC_ENUM_SESS_TYPE_REMOTE
Battery Remaining Longevity: 152 mo
Battery Voltage: 2.78 V
Brady Statistic AS VP Percent: 0 %
Lead Channel Pacing Threshold Amplitude: 0.5 V
Lead Channel Pacing Threshold Amplitude: 0.75 V
Lead Channel Pacing Threshold Pulse Width: 0.4 ms
Lead Channel Pacing Threshold Pulse Width: 0.4 ms
Lead Channel Sensing Intrinsic Amplitude: 5.6 mV
Lead Channel Setting Pacing Amplitude: 2 V
Lead Channel Setting Pacing Amplitude: 2.5 V
Lead Channel Setting Pacing Pulse Width: 0.4 ms
Lead Channel Setting Sensing Sensitivity: 2 mV
MDC IDC MSMT BATTERY IMPEDANCE: 134 Ohm
MDC IDC MSMT LEADCHNL RA IMPEDANCE VALUE: 510 Ohm
MDC IDC MSMT LEADCHNL RA SENSING INTR AMPL: 1 mV
MDC IDC MSMT LEADCHNL RV IMPEDANCE VALUE: 605 Ohm
MDC IDC SESS DTM: 20150812105640
MDC IDC STAT BRADY AP VP PERCENT: 0 %
MDC IDC STAT BRADY AP VS PERCENT: 0 %
MDC IDC STAT BRADY AS VS PERCENT: 99 %

## 2013-12-22 NOTE — Progress Notes (Signed)
Remote pacemaker transmission.   

## 2013-12-27 ENCOUNTER — Encounter: Payer: Self-pay | Admitting: Hematology & Oncology

## 2013-12-27 ENCOUNTER — Ambulatory Visit (HOSPITAL_BASED_OUTPATIENT_CLINIC_OR_DEPARTMENT_OTHER): Payer: Medicare Other | Admitting: Lab

## 2013-12-27 ENCOUNTER — Telehealth: Payer: Self-pay | Admitting: Hematology & Oncology

## 2013-12-27 ENCOUNTER — Ambulatory Visit (HOSPITAL_BASED_OUTPATIENT_CLINIC_OR_DEPARTMENT_OTHER): Payer: Medicare Other | Admitting: Hematology & Oncology

## 2013-12-27 VITALS — BP 165/93 | HR 82 | Temp 98.2°F | Resp 18 | Ht 71.0 in | Wt 189.0 lb

## 2013-12-27 DIAGNOSIS — G610 Guillain-Barre syndrome: Secondary | ICD-10-CM | POA: Diagnosis not present

## 2013-12-27 DIAGNOSIS — I495 Sick sinus syndrome: Secondary | ICD-10-CM

## 2013-12-27 DIAGNOSIS — D61818 Other pancytopenia: Secondary | ICD-10-CM | POA: Diagnosis not present

## 2013-12-27 DIAGNOSIS — I498 Other specified cardiac arrhythmias: Secondary | ICD-10-CM | POA: Diagnosis not present

## 2013-12-27 DIAGNOSIS — D509 Iron deficiency anemia, unspecified: Secondary | ICD-10-CM | POA: Diagnosis not present

## 2013-12-27 DIAGNOSIS — D649 Anemia, unspecified: Secondary | ICD-10-CM | POA: Diagnosis not present

## 2013-12-27 DIAGNOSIS — R7989 Other specified abnormal findings of blood chemistry: Secondary | ICD-10-CM | POA: Diagnosis not present

## 2013-12-27 DIAGNOSIS — D72819 Decreased white blood cell count, unspecified: Secondary | ICD-10-CM | POA: Diagnosis not present

## 2013-12-27 DIAGNOSIS — E349 Endocrine disorder, unspecified: Secondary | ICD-10-CM

## 2013-12-27 DIAGNOSIS — R609 Edema, unspecified: Secondary | ICD-10-CM

## 2013-12-27 DIAGNOSIS — R5381 Other malaise: Secondary | ICD-10-CM | POA: Diagnosis not present

## 2013-12-27 DIAGNOSIS — R5383 Other fatigue: Secondary | ICD-10-CM

## 2013-12-27 DIAGNOSIS — Z86010 Personal history of colonic polyps: Secondary | ICD-10-CM

## 2013-12-27 DIAGNOSIS — K644 Residual hemorrhoidal skin tags: Secondary | ICD-10-CM

## 2013-12-27 DIAGNOSIS — R634 Abnormal weight loss: Secondary | ICD-10-CM

## 2013-12-27 DIAGNOSIS — D126 Benign neoplasm of colon, unspecified: Secondary | ICD-10-CM | POA: Diagnosis not present

## 2013-12-27 DIAGNOSIS — E291 Testicular hypofunction: Secondary | ICD-10-CM

## 2013-12-27 DIAGNOSIS — K7460 Unspecified cirrhosis of liver: Secondary | ICD-10-CM | POA: Diagnosis not present

## 2013-12-27 DIAGNOSIS — R945 Abnormal results of liver function studies: Secondary | ICD-10-CM

## 2013-12-27 DIAGNOSIS — D472 Monoclonal gammopathy: Secondary | ICD-10-CM | POA: Diagnosis not present

## 2013-12-27 DIAGNOSIS — M7020 Olecranon bursitis, unspecified elbow: Secondary | ICD-10-CM

## 2013-12-27 DIAGNOSIS — I10 Essential (primary) hypertension: Secondary | ICD-10-CM | POA: Diagnosis not present

## 2013-12-27 DIAGNOSIS — M6250 Muscle wasting and atrophy, not elsewhere classified, unspecified site: Secondary | ICD-10-CM | POA: Diagnosis not present

## 2013-12-27 DIAGNOSIS — R0602 Shortness of breath: Secondary | ICD-10-CM

## 2013-12-27 DIAGNOSIS — Z950 Presence of cardiac pacemaker: Secondary | ICD-10-CM | POA: Diagnosis not present

## 2013-12-27 DIAGNOSIS — R059 Cough, unspecified: Secondary | ICD-10-CM

## 2013-12-27 DIAGNOSIS — R7309 Other abnormal glucose: Secondary | ICD-10-CM

## 2013-12-27 DIAGNOSIS — L723 Sebaceous cyst: Secondary | ICD-10-CM

## 2013-12-27 DIAGNOSIS — M129 Arthropathy, unspecified: Secondary | ICD-10-CM

## 2013-12-27 DIAGNOSIS — F17200 Nicotine dependence, unspecified, uncomplicated: Secondary | ICD-10-CM | POA: Diagnosis not present

## 2013-12-27 DIAGNOSIS — D696 Thrombocytopenia, unspecified: Secondary | ICD-10-CM

## 2013-12-27 DIAGNOSIS — G6181 Chronic inflammatory demyelinating polyneuritis: Secondary | ICD-10-CM

## 2013-12-27 DIAGNOSIS — M6281 Muscle weakness (generalized): Secondary | ICD-10-CM

## 2013-12-27 DIAGNOSIS — R05 Cough: Secondary | ICD-10-CM

## 2013-12-27 DIAGNOSIS — G63 Polyneuropathy in diseases classified elsewhere: Secondary | ICD-10-CM

## 2013-12-27 DIAGNOSIS — E538 Deficiency of other specified B group vitamins: Secondary | ICD-10-CM

## 2013-12-27 LAB — CBC WITH DIFFERENTIAL (CANCER CENTER ONLY)
BASO#: 0 10*3/uL (ref 0.0–0.2)
BASO%: 0.8 % (ref 0.0–2.0)
EOS ABS: 0.1 10*3/uL (ref 0.0–0.5)
EOS%: 2.7 % (ref 0.0–7.0)
HEMATOCRIT: 38.2 % — AB (ref 38.7–49.9)
HEMOGLOBIN: 13.1 g/dL (ref 13.0–17.1)
LYMPH#: 1.7 10*3/uL (ref 0.9–3.3)
LYMPH%: 44.6 % (ref 14.0–48.0)
MCH: 30.3 pg (ref 28.0–33.4)
MCHC: 34.3 g/dL (ref 32.0–35.9)
MCV: 88 fL (ref 82–98)
MONO#: 0.5 10*3/uL (ref 0.1–0.9)
MONO%: 14.5 % — ABNORMAL HIGH (ref 0.0–13.0)
NEUT#: 1.4 10*3/uL — ABNORMAL LOW (ref 1.5–6.5)
NEUT%: 37.4 % — AB (ref 40.0–80.0)
PLATELETS: 154 10*3/uL (ref 145–400)
RBC: 4.33 10*6/uL (ref 4.20–5.70)
RDW: 13.3 % (ref 11.1–15.7)
WBC: 3.7 10*3/uL — AB (ref 4.0–10.0)

## 2013-12-27 LAB — IRON AND TIBC CHCC
%SAT: 37 % (ref 20–55)
IRON: 105 ug/dL (ref 42–163)
TIBC: 281 ug/dL (ref 202–409)
UIBC: 176 ug/dL (ref 117–376)

## 2013-12-27 LAB — CMP (CANCER CENTER ONLY)
ALT(SGPT): 93 U/L — ABNORMAL HIGH (ref 10–47)
AST: 106 U/L — ABNORMAL HIGH (ref 11–38)
Albumin: 3.2 g/dL — ABNORMAL LOW (ref 3.3–5.5)
Alkaline Phosphatase: 71 U/L (ref 26–84)
BILIRUBIN TOTAL: 0.5 mg/dL (ref 0.20–1.60)
BUN, Bld: 12 mg/dL (ref 7–22)
CO2: 26 meq/L (ref 18–33)
CREATININE: 0.8 mg/dL (ref 0.6–1.2)
Calcium: 8.5 mg/dL (ref 8.0–10.3)
Chloride: 104 mEq/L (ref 98–108)
GLUCOSE: 105 mg/dL (ref 73–118)
Potassium: 3.9 mEq/L (ref 3.3–4.7)
Sodium: 139 mEq/L (ref 128–145)
Total Protein: 7.3 g/dL (ref 6.4–8.1)

## 2013-12-27 LAB — FERRITIN CHCC: Ferritin: 1545 ng/ml — ABNORMAL HIGH (ref 22–316)

## 2013-12-27 LAB — CHCC SATELLITE - SMEAR

## 2013-12-27 NOTE — Telephone Encounter (Signed)
Mailed oct schedule time ok to draw cryoglobulins per Jackelyn Poling

## 2013-12-29 ENCOUNTER — Encounter: Payer: Self-pay | Admitting: Neurology

## 2013-12-29 LAB — PROTEIN ELECTROPHORESIS, SERUM, WITH REFLEX
ALPHA-1-GLOBULIN: 4 % (ref 2.9–4.9)
ALPHA-2-GLOBULIN: 11 % (ref 7.1–11.8)
Albumin ELP: 55.2 % — ABNORMAL LOW (ref 55.8–66.1)
BETA 2: 8.8 % — AB (ref 3.2–6.5)
Beta Globulin: 6.6 % (ref 4.7–7.2)
Gamma Globulin: 14.4 % (ref 11.1–18.8)
Total Protein, Serum Electrophoresis: 7 g/dL (ref 6.0–8.3)

## 2013-12-29 LAB — IGG, IGA, IGM
IgA: 614 mg/dL — ABNORMAL HIGH (ref 68–379)
IgG (Immunoglobin G), Serum: 1200 mg/dL (ref 650–1600)
IgM, Serum: 120 mg/dL (ref 41–251)

## 2013-12-29 LAB — IFE INTERPRETATION

## 2013-12-29 LAB — RETICULOCYTES (CHCC)
ABS RETIC: 43.9 10*3/uL (ref 19.0–186.0)
RBC.: 4.39 MIL/uL (ref 4.22–5.81)
Retic Ct Pct: 1 % (ref 0.4–2.3)

## 2013-12-30 NOTE — Progress Notes (Signed)
Hematology and Oncology Follow Up Visit  Jonathon [name] [id number] 09-Feb-1936 78 y.o. 12/30/2013   Principle Diagnosis:  Iron deficiency anemia Hypotestosteronemia   Current Therapy:    IV iron as indicated  Testosterone gel supplementation     Interim History:  Mr.  [name] is back for followup. He is being seen by neurology. They are doing a great job with him. They've done quite a few studies on him. There were about the possibility of CIDP. They think this might be possible. There is also a concern for POEMS. They think this might be the case because he has a monoclonal myopathy.  He's been given some pulse steroid doses.  He feels better. He feels a little stronger. He is working.  We last saw him, his ferritin was 512 with an iron saturation 24%. We last saw him, there is no obvious monoclonal spike in his serum. His IgA level was 504 mg/dL.  He's had no fever. He's had no rashes. His had no change in bowel or bladder habits.    Medications: Current outpatient prescriptions:cholecalciferol (VITAMIN D) 400 UNITS TABS tablet, Take 400 Units by mouth daily. , Disp: , Rfl: ;  Cyanocobalamin (VITAMIN B 12 PO), Take 1,000 mg by mouth daily., Disp: , Rfl: ;  diltiazem (CARDIZEM LA) 240 MG 24 hr tablet, Take 1 tablet (240 mg total) by mouth daily., Disp: 90 tablet, Rfl: 3;  Garlic 419 MG CAPS, Take 1,000 mg by mouth every morning. , Disp: , Rfl:  Niacin (VITAMIN B-3 PO), Take by mouth every morning., Disp: , Rfl: ;  Omega-3 Fatty Acids (FISH OIL) 1000 MG CAPS, Take by mouth every morning., Disp: , Rfl: ;  tamsulosin (FLOMAX) 0.4 MG CAPS capsule, Take 1 capsule (0.4 mg total) by mouth daily., Disp: 90 capsule, Rfl: 3;  testosterone (ANDROGEL) 50 MG/5GM (1%) GEL, Place 5 g onto the skin daily., Disp: 30 Package, Rfl: 3 No current facility-administered medications for this visit. Facility-Administered Medications Ordered in Other Visits: methylPREDNISolone sodium succinate (SOLU-MEDROL)  injection 1,000 mg, 1,000 mg, Intravenous, Once, Donika K Patel, DO;  methylPREDNISolone sodium succinate (SOLU-MEDROL) injection 1,000 mg, 1,000 mg, Intravenous, Once, Donika Keith Rake, DO  Allergies: No Known Allergies  Past Medical History, Surgical history, Social history, and Family History were reviewed and updated.  Review of Systems: As above  Physical Exam:  height is 5\' 11"  (1.803 m) and weight is 189 lb (85.73 kg). His oral temperature is 98.2 F (36.8 C). His blood pressure is 165/93 and his pulse is 82. His respiration is 18.   Well-developed and well-nourished African regular. Head and neck exam shows no ocular or oral lesion. He has no palpable cervical or supraclavicular lymph nodes. Lungs are clear bilaterally. Cardiac exam regular in rhythm with no murmurs rubs or bruits. Abdomen is soft. Has good bowel sounds. There is no fluid wave. There is no palpable liver or spleen tip. Back exam no tenderness over the spine ribs or hips. He has no tenderness over the muscles. Extremities shows no clubbing cyanosis or edema. He has good strength in his extremities. Good range of motion of his joints. Skin exam shows no rashes, ecchymosis or petechia. Neurological exam shows no focal neurological deficits.  Lab Results  Component Value Date   WBC 3.7* 12/27/2013   HGB 13.1 12/27/2013   HCT 38.2* 12/27/2013   MCV 88 12/27/2013   PLT 154 12/27/2013     Chemistry      Component Value Date/Time  NA 139 12/27/2013 0941   NA 136 10/27/2013 1327   K 3.9 12/27/2013 0941   K 4.0 10/27/2013 1327   CL 104 12/27/2013 0941   CL 104 10/27/2013 1327   CO2 26 12/27/2013 0941   CO2 22 10/27/2013 1327   BUN 12 12/27/2013 0941   BUN 16 10/27/2013 1327   CREATININE 0.8 12/27/2013 0941   CREATININE 0.98 10/27/2013 1327      Component Value Date/Time   CALCIUM 8.5 12/27/2013 0941   CALCIUM 8.3* 10/27/2013 1327   ALKPHOS 71 12/27/2013 0941   ALKPHOS 66 10/27/2013 1327   AST 106* 12/27/2013 0941   AST 43*  10/27/2013 1327   ALT 93* 12/27/2013 0941   ALT 56* 10/27/2013 1327   BILITOT 0.50 12/27/2013 0941   BILITOT 1.5* 10/27/2013 1327      Ferritin is 1545. Iron saturation is 37%. Total iron is 105. Monoclonal spike is non-detectable. His IgA level is 614 mg/dL   Impression and Plan: Jonathon [name] is a 78 year old male with iron deficiency anemia. We have taken care of this pretty well. His iron studies are normal.  He does have some mildly elevated liver function tests. I think this is been looked at before. A CT scan done back in April showed a fatty liver.  We will see what the neurological evaluation shows up.  I will see him back in another couple months.   Volanda Napoleon, MD 8/20/20156:07 AM

## 2014-01-06 ENCOUNTER — Encounter: Payer: Self-pay | Admitting: Cardiology

## 2014-01-13 ENCOUNTER — Encounter: Payer: Self-pay | Admitting: Internal Medicine

## 2014-01-20 ENCOUNTER — Encounter (HOSPITAL_COMMUNITY)
Admission: RE | Admit: 2014-01-20 | Discharge: 2014-01-20 | Disposition: A | Discharge status: Home / Self Care | Payer: Medicare Other | Source: Ambulatory Visit | Attending: Neurology | Admitting: Neurology

## 2014-01-20 DIAGNOSIS — M6281 Muscle weakness (generalized): Secondary | ICD-10-CM | POA: Insufficient documentation

## 2014-01-20 DIAGNOSIS — M6250 Muscle wasting and atrophy, not elsewhere classified, unspecified site: Secondary | ICD-10-CM | POA: Diagnosis not present

## 2014-01-20 DIAGNOSIS — G608 Other hereditary and idiopathic neuropathies: Secondary | ICD-10-CM | POA: Diagnosis not present

## 2014-01-20 MED ORDER — SODIUM CHLORIDE 0.9 % IV SOLN
1000.0000 mg | INTRAVENOUS | Status: DC
  Administered 2014-01-20: 1000 mg via INTRAVENOUS
  Filled 2014-01-20: qty 8

## 2014-01-28 ENCOUNTER — Encounter: Payer: Self-pay | Admitting: Gastroenterology

## 2014-02-07 ENCOUNTER — Encounter: Payer: Self-pay | Admitting: Internal Medicine

## 2014-02-07 ENCOUNTER — Ambulatory Visit (INDEPENDENT_AMBULATORY_CARE_PROVIDER_SITE_OTHER): Payer: Medicare Other | Admitting: Internal Medicine

## 2014-02-07 VITALS — BP 140/80 | HR 93 | Temp 98.3°F | Resp 20 | Ht 70.0 in | Wt 189.0 lb

## 2014-02-07 DIAGNOSIS — Z950 Presence of cardiac pacemaker: Secondary | ICD-10-CM | POA: Diagnosis not present

## 2014-02-07 DIAGNOSIS — R7989 Other specified abnormal findings of blood chemistry: Secondary | ICD-10-CM | POA: Diagnosis not present

## 2014-02-07 DIAGNOSIS — D509 Iron deficiency anemia, unspecified: Secondary | ICD-10-CM

## 2014-02-07 DIAGNOSIS — I10 Essential (primary) hypertension: Secondary | ICD-10-CM | POA: Diagnosis not present

## 2014-02-07 DIAGNOSIS — R945 Abnormal results of liver function studies: Secondary | ICD-10-CM

## 2014-02-07 NOTE — Patient Instructions (Signed)
Limit your sodium (Salt) intake  Please check your blood pressure on a regular basis.  If it is consistently greater than 150/90, please make an office appointment.    It is important that you exercise regularly, at least 20 minutes 3 to 4 times per week.  If you develop chest pain or shortness of breath seek  medical attention.  Return in 6 months for follow-up  

## 2014-02-07 NOTE — Progress Notes (Signed)
Pre visit review using our clinic review tool, if applicable. No additional management support is needed unless otherwise documented below in the visit note. 

## 2014-02-07 NOTE — Progress Notes (Signed)
Subjective:    Patient ID: Jonathon [name], male    DOB: 11-08-1935, 78 y.o.   MRN: [id number]  HPI 77 year old patient who is seen today for followup.  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  More recently has been receiving monthly Solu-Medrol boluses with improvement in appetite and weight gain.  He continues to have lower extremity weakness and some muscle pain, but denies any paresthesias.  He continues to work 20 hours per week.  Past Medical History  Diagnosis Date  . BENIGN PROSTATIC HYPERTROPHY, HX OF 10/25/2008  . BRADYCARDIA 10/25/2008  . HYPERTENSION 11/21/2006  . 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 PACEMAKER, PERMANENT 10/25/2008  . TOBACCO ABUSE 10/25/2008    Quit 2012  . Iron deficiency anemia, unspecified 04/19/2013  . Asthma     History   Social History  . Marital Status: Divorced    Spouse Name: N/A    Number of Children: 40  . Years of Education: N/A   Occupational History  . ENVIROMENTAL SERVICE    Social History Main Topics  . Smoking status: Former Smoker -- 0.25 packs/day for 46 years    Types: Cigarettes    Start date: 03/16/1965    Quit date: 06/15/2010  . Smokeless tobacco: Never Used     Comment: quit 3 years ago  . Alcohol Use: 0.0 oz/week     Comment: 4 beers per day  . Drug Use: No  . Sexual Activity: Not on file   Other Topics Concern  . 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    Past Surgical History  Procedure Laterality Date  . Pacemaker insertion  2005  . Colonoscopy  [id number]  . Skin graft Right 1962    wrist    Family History  Problem Relation Age of Onset  . Breast cancer Sister     Living, 71  . Diabetes type II Brother     Living, 40  . Breast cancer Sister     Living, 62  . Heart attack Father    Died, 51  . Kidney disease Father   . Parkinson's disease Mother     Died, 56  . Diabetes Daughter     Living, 4  . Diabetes Son     Living, 36  . Colon cancer Neg Hx   . Rectal cancer Neg Hx   . Stomach cancer Neg Hx   . Ovarian cancer Daughter     No Known Allergies  Current Outpatient Prescriptions on File Prior to Visit  Medication Sig Dispense Refill  . cholecalciferol (VITAMIN D) 400 UNITS TABS tablet Take 400 Units by mouth daily.       . Cyanocobalamin (VITAMIN B 12 PO) Take 1,000 mg by mouth daily.      Marland Kitchen diltiazem (CARDIZEM LA) 240 MG 24 hr tablet Take 1 tablet (240 mg total) by mouth daily.  90 tablet  3  . Garlic 588 MG CAPS Take 1,000 mg by mouth every morning.       . Niacin (VITAMIN B-3 PO) Take by mouth every morning.      . Omega-3 Fatty Acids (FISH OIL) 1000 MG CAPS Take by mouth every morning.      . tamsulosin (FLOMAX) 0.4 MG CAPS capsule Take 1  capsule (0.4 mg total) by mouth daily.  90 capsule  3  . testosterone (ANDROGEL) 50 MG/5GM (1%) GEL Place 5 g onto the skin daily.  30 Package  3   Current Facility-Administered Medications on File Prior to Visit  Medication Dose Route Frequency Provider Last Rate Last Dose  . methylPREDNISolone sodium succinate (SOLU-MEDROL) injection 1,000 mg  1,000 mg Intravenous Once Donika K Patel, DO      . methylPREDNISolone sodium succinate (SOLU-MEDROL) injection 1,000 mg  1,000 mg Intravenous Once Donika K Patel, DO        BP 140/80  Pulse 93  Temp(Src) 98.3 F (36.8 C) (Oral)  Resp 20  Ht 5\' 10"  (1.778 m)  Wt 189 lb (85.73 kg)  BMI 27.12 kg/m2  SpO2 98%      Review of Systems  Constitutional: Negative for fever, chills, appetite change and fatigue.  HENT: Negative for congestion, dental problem, ear pain, hearing loss, sore throat, tinnitus, trouble swallowing and voice change.   Eyes: Negative for pain, discharge and visual disturbance.  Respiratory: Negative for cough, chest tightness, wheezing and stridor.    Cardiovascular: Negative for chest pain, palpitations and leg swelling.  Gastrointestinal: Negative for nausea, vomiting, abdominal pain, diarrhea, constipation, blood in stool and abdominal distention.  Genitourinary: Negative for urgency, hematuria, flank pain, discharge, difficulty urinating and genital sores.  Musculoskeletal: Positive for myalgias. Negative for arthralgias, back pain, gait problem, joint swelling and neck stiffness.  Skin: Negative for rash.  Neurological: Positive for weakness. Negative for dizziness, syncope, speech difficulty, numbness and headaches.  Hematological: Negative for adenopathy. Does not bruise/bleed easily.  Psychiatric/Behavioral: Negative for behavioral problems and dysphoric mood. The patient is not nervous/anxious.        Objective:   Physical Exam  Constitutional: He is oriented to person, place, and time. He appears well-developed.  HENT:  Head: Normocephalic.  Right Ear: External ear normal.  Left Ear: External ear normal.  Eyes: Conjunctivae and EOM are normal.  Neck: Normal range of motion.  Cardiovascular: Normal rate and normal heart sounds.   Pulmonary/Chest: Breath sounds normal.  Abdominal: Bowel sounds are normal.  Musculoskeletal: Normal range of motion. He exhibits no edema and no tenderness.  Neurological: He is alert and oriented to person, place, and time.  Able to perform 5 squats quickly and easily  Psychiatric: He has a normal mood and affect. His behavior is normal.          Assessment & Plan:   Hypertension well controlled BPH Poly-radiculoneuropathy.  Followup neurology (in 2 days)  Continue low-salt diet Continue exercise regimen Continue serial blood pressure monitoring Followup neurology Recheck here 6 months

## 2014-02-08 ENCOUNTER — Other Ambulatory Visit: Payer: Self-pay | Admitting: Internal Medicine

## 2014-02-09 ENCOUNTER — Encounter: Payer: Self-pay | Admitting: Neurology

## 2014-02-09 ENCOUNTER — Ambulatory Visit (INDEPENDENT_AMBULATORY_CARE_PROVIDER_SITE_OTHER): Payer: Medicare Other | Admitting: Neurology

## 2014-02-09 VITALS — BP 160/90 | HR 94 | Ht 70.0 in | Wt 189.2 lb

## 2014-02-09 DIAGNOSIS — G6181 Chronic inflammatory demyelinating polyneuritis: Secondary | ICD-10-CM | POA: Diagnosis not present

## 2014-02-09 DIAGNOSIS — G63 Polyneuropathy in diseases classified elsewhere: Secondary | ICD-10-CM | POA: Diagnosis not present

## 2014-02-09 DIAGNOSIS — D472 Monoclonal gammopathy: Secondary | ICD-10-CM

## 2014-02-09 DIAGNOSIS — M6258 Muscle wasting and atrophy, not elsewhere classified, other site: Secondary | ICD-10-CM

## 2014-02-09 DIAGNOSIS — M6250 Muscle wasting and atrophy, not elsewhere classified, unspecified site: Secondary | ICD-10-CM

## 2014-02-09 NOTE — Patient Instructions (Signed)
1.  I will send a message to Dr. Marin Olp to get his opinion on whether your symptoms may be related POEMS Syndrome 2.  Continue your monthly steroid infusions 3.  Continue your gym exercises for leg strengthening 4.  I will see you back in January, or sooner as needed

## 2014-02-09 NOTE — Progress Notes (Signed)
[facility]  Follow-up Visit   Date: 02/09/2014   Jonathon [name] MRN: [id number] DOB: [birth year]   Interim History: Jonathon [name] is a 78 y.o. 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   History of present illness: Patient was in his usual state of health until 2013. He has always been physical active and would go to the gym daily (walking up 10 miles/day). During the spring of 2013, he developed relatively subacute onset of left > right hamstring soreness without an inciting event. Pain initially started in the butt area and radiated down. There is mild soreness of the calf bilaterally. It is exacerbated by prolonged sitting, such as when driving. There is associated proximal leg weakness and loss of muscle mass. He tried doing 50lb squats at the gym to strengthen his quadriceps over the past month but has not noticed any change in his symptoms. Functionally, he is still able to complete all his usual activities, but has became much more cautious when climbing stairs.   - Follow-up 02/01/2013:  He continues to have weakness of his proximal legs, worse on the left. EMG did not show any myopathy or radiculopathy of the left lower extremity.  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  CK, aldolase, B12 was within normal limits. SPEP/UPEP shows monoclonal IgA kappa gammopathy and he underwent extensive hematologic testing, including bone marrow biopsy by Dr. Marin Olp which has been nondiagnostic.    - Follow-up 08/10/2013: Progressive loss of muscle bulk over the upper arms and proximal legs. He has difficulty with climbing stairs and opening jars, and dyspnea on exertion. He feels unsteady at times, denies any falls. 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   He reports previously drinking 4 beers/day and on the weekends up to 6 beers/day and recently has stopped.   He also reports having 15lb unintentional weight loss and has been eating little.    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  CK is normal, aldolase elevated, and GM1 antibody is negative. There is evidence of severe multilevel spinal stenosis of the cervical spine, and milder findings in the lumbar region.  Clinically, his remains stable without any significant worsening.    - Follow-up 10/12/2013:  CSF studies performed shows increase oligoclonal bands with normal cell count, IgG index, and myelin basic proteins.  He feels that his legs continue to worsen.     - Follow-up 11/09/2013:  He completed 3-days of solumedrol (6/8/-6/10) and since starting steroids, his muscle soreness has significantly improved.   His appetite is better and he gained 5lb over the past month.  Mood remains good.  He is still drinking 2 beers/nightly and 6 beers/d on the weekends.  Anti-MAG returned negative.  UPDATE 02/09/2014:  He continues to get monthly steroid infusions and has been tolerating it well. There has been no worsening or notable change in muscle strength that he reports, although he has been going to the gym and pushing 85 pounds with leg exercises. He has no interval falls.  He has gained 8lb, which is great.     Medications:  Current Outpatient Prescriptions on File Prior to Visit  Medication Sig Dispense Refill  . cholecalciferol (VITAMIN D) 400 UNITS TABS tablet Take 400 Units by  mouth daily.       . Cyanocobalamin (VITAMIN B 12 PO) Take 1,000 mg by mouth daily.      Marland Kitchen diltiazem (CARDIZEM LA) 240 MG 24 hr tablet Take 1 tablet (240 mg total) by mouth daily.  90 tablet  3  . Garlic 017 MG CAPS Take 1,000 mg by mouth every morning.       . Niacin (VITAMIN B-3 PO) Take by mouth every morning.      . Omega-3 Fatty  Acids (FISH OIL) 1000 MG CAPS Take by mouth every morning.      . tamsulosin (FLOMAX) 0.4 MG CAPS capsule TAKE 1 CAPSULE BY MOUTH DAILY  90 capsule  1  . testosterone (ANDROGEL) 50 MG/5GM (1%) GEL Place 5 g onto the skin daily.  30 Package  3   Current Facility-Administered Medications on File Prior to Visit  Medication Dose Route Frequency Provider Last Rate Last Dose  . methylPREDNISolone sodium succinate (SOLU-MEDROL) injection 1,000 mg  1,000 mg Intravenous Once Donika K Patel, DO      . methylPREDNISolone sodium succinate (SOLU-MEDROL) injection 1,000 mg  1,000 mg Intravenous Once Donika K Patel, DO        Allergies: No Known Allergies   Review of Systems:  CONSTITUTIONAL: No fevers, chills, night sweats EYES: No visual changes or eye pain ENT: No hearing changes.  No history of nose bleeds.   RESPIRATORY: No cough, wheezing and shortness of breath.   CARDIOVASCULAR: Negative for chest pain, and palpitations.   GI: Negative for abdominal discomfort, blood in stools or black stools.  No recent change in bowel habits.   GU:  No history of incontinence.   MUSCLOSKELETAL: No history of joint pain or swelling.  No myalgias.   SKIN: Negative for lesions, rash, and itching.   HEMATOLOGY/ONCOLOGY: Negative for prolonged bleeding, bruising easily, and swollen nodes.  ENDOCRINE: Negative for cold or heat intolerance, polydipsia or goiter.   PSYCH:  +depression or anxiety symptoms.   NEURO: As Above.   Vital Signs:  BP 160/90  Pulse 94  Ht _0  (1.778 m)  Wt 189 lb 4 oz (85.843 kg)  BMI 27.15 kg/m2  SpO2 96%  Neurological Exam: MENTAL STATUS including orientation to time, place, person, recent and remote memory, attention span and concentration, language, and fund of knowledge is normal.  Speech is not dysarthric.  CRANIAL NERVES:  Pupils are round and reactive to light.  Normal conjugate, extra-ocular eye movements in all directions of gaze. No ptosis.  Face is symmetric.  Palate  elevates symmetrically.  Normal shoulder shrug and head rotation.  Normal tongue strength and range of motion, no deviation or fasciculation.  MOTOR:  Bilateral moderate ADM, FDI, ABP and moderate bilateral quadriceps atrophy.  No fasciculations.  Mild resting head tremor.  No pronator drift.  Neck flexion and extension is 5/5  Right Upper Extremity:    Left Upper Extremity:    Deltoid  5/5   Deltoid  5/5   Biceps  5/5   Biceps  5/5   Triceps  5/5   Triceps  5/5   Wrist extensors  5/5   Wrist extensors  5/5   Wrist flexors  5/5   Wrist flexors  5/5   Finger extensors  5/5   Finger extensors  5/5   Finger flexors  5/5   Finger flexors  5/5   Dorsal interossei  4/5   Dorsal interossei  4/5   Abductor pollicis  4+/5  Abductor pollicis  4+/5   Tone (Ashworth scale)  0  Tone (Ashworth scale)  0   Right Lower Extremity:    Left Lower Extremity:    Hip flexors  5-/5   Hip flexors  5-/5   Hip extensors  5/5   Hip extensors  5/5   Knee flexors  5/5   Knee flexors  5/5   Knee extensors  5/5   Knee extensors  5/5   Dorsiflexors  5/5   Dorsiflexors  5/5   Plantarflexors  5/5   Plantarflexors  5/5   Toe extensors  5/5   Toe extensors  5/5   Toe flexors  5/5   Toe flexors  5/5   Tone (Ashworth scale)  0  Tone (Ashworth scale)  0   MSRs:  Reflexes are 2+/4 in upper extremities, 1+ patella, and absent at Achilles.  Down-going plantar responses.  SENSORY:  Vibration and temperature reduced distal to right ankle.  Pin prick intact throughout.    COORDINATION/GAIT: Normal finger-to- nose-finger and heel-to-shin.  Intact rapid alternating movements bilaterally.  Able to rise from a chair without using arms.  Gait narrow based and stable. Tandem and stressed gait intact.  He is able to perform squat and get up without difficulty  Data: Labs 01/13/2013:  CK 55, aldolase 7.9, TSH 0.39, copper 97, ceruloplasmin 30,  Labs 07/2013:  vitamin  B12 519, ANA neg, RF neg, ESR 5 Labs 08/10/2013:  CK 91, aldolase  14.9*, GM1 antibody - neg Labs 11/09/2013:  VEGF 118* (normal 31-86)  CSF 09/24/2013:  R1 W0 G59 Protein 42, IgG index 4.4, MBP < 2.0, WNV - neg, cytology - neg, OCB - 3 well defined bands present in CSF and serum, more prominent in CSF  EMG 01/25/2013:   1. 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  2. Left ulnar neuropathy at the elbow, moderately severe in degree electrically. A superimposed C8 motor radiculopathy cannot be excluded. 3. No evidence of diffuse motor axon loss or a generalized myopathy.   EMG 09/16/2013: NCV & EMG Findings:  Extensive evaluation of the left upper and lower extremities with additional studies of the mid-thoracic paraspinal (T11 level) muscles reveals:  1.  The median sensory response is prolonged (4.2 ms) and reduced in amplitude (8.6 V). The ulnar sensory response is absent. The radial sensory response is normal.  2.  Normal sural and superficial peroneal sensory responses. 3.  The median motor response is prolonged (4.9 ms) and reduced in amplitude(4.5 mV).. The ulnar motor response is reduced in amplitude (6.0 mV), with evidence of conduction block in the forearm and across the elbow, and has decreased conduction velocity (A Elbow-B Elbow, 20 m/s).  4.  The left peroneal motor nerve showed decreased conduction velocity (Poplt-B Fib, 29 m/s). The left tibial motor nerve showed decreased conduction velocity (Knee-Ankle, 33 m/s).  5.  F Wave studies indicate that the left tibial F wave has prolonged latency (62.85 ms). All remaining F Wave latencies were within normal limits.  6.  H-reflex studies indicate that the left tibial H-reflex has prolonged latency (46.26 ms). The right tibial H-reflex has prolonged latency (39.32 ms).  7.  There is evidence of diffuse and generalized chronic motor axon loss changes affecting the aorta the tested muscles of the upper  and lower extremities, with accompanied active denervation. No fasciculation potentials are seen.  8.  There is no  evidence of active denervation affecting the thoracic paraspinal muscle at T11  Impression:  There is electrophysiological evidence of a chronic generalized sensorimotor polyradiculoneuropathy, axonal loss and demyelinating in type, affecting the left side. When compared to his previous EMG dated 01/25/2013, there is an interval worsening of findings.  CT cervical spine and lumbar spine 08/17/2013: 1. Advanced degenerative changes in the cervical spine, including multilevel bulky anterior endplate osteophytes.  2. Suspect multifactorial moderate to severe cervical spinal stenosis at the C5-C6 and C6-C7 levels. As the patient is not a  candidate for MRI, cervical spine CT myelogram would confirm.  3. Associated severe multifactorial neural foraminal stenosis at the right C4, right C5, right C6, bilateral C7, and bilateral C8 nerve levels.  4. Mild for age lumbar spine degenerative changes, chiefly facet arthropathy. Up to mild multifactorial spinal stenosis at L2-L3 and L3-L4.    IMPRESSION: Jonathon [name] is a delightful 78 year-old gentleman returning for evaluation of CIDP manifesting with leg weakness and loss of muscle bulk.  He underwent extensive testing including EMG, serology tests, cervical and lumbar spine imaging, and CSF testing.  His electrodiagnostic testing last performed in May 2015 showed progression of axon loss and demyelinating features affecting the left side, most suggestive of an acquired polyradiculoneuropathy.  He underwent CSF testing which showed normal cell count and protein. 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     Labs showed monoclonal IgA kappa gammopathy on serum for which he  underwent extensive evaluation by Dr. Marin Olp, including bone marrow biopsy which was negative.  He was noted to have iron deficiency anemia and had iron transfusion.  He also has transaminatis likely due to fatty liver.  Based on his testing, I have high clinical suspicion for chronic inflammatory demyelinating polyradiculoneuropathy, associated with monoclonal gammopathy of undetermined significance of the IgA kappa type.  He has underwent solumedrol 1g x 3 days in early June and reports improvement of muscle soreness.   Today, there continues to be improvement of proximal leg stregnth, although hand weakness persists and may also be due to superimposed cervical foraminal stenosis at this level.  Right patella reflex is also much easier to elicit than previously, suggesting reinnervation.    PLAN: For management of CIDP, I will continue solumedrol 1g monthly and follow him closely.    Other medical co-morbidities include MGUS and transaminitis.  For completeness, at his last visit I checked VEGF for POEMS syndrome, since he has transaminitis, monoclonal gammopathy, skin changes, and demyelinating neuropathy. His VEGF returned high, so would like to get Dr. Antonieta Pert opinion on further management. Of note, he did receive 3-days IVMP prior to checking his VEGF level so it is possible it may have been even higher.   Return to clinic in 43-month.    The duration of this appointment visit was 30 minutes of face-to-face time with the patient.  Greater than 50% of this time was spent in counseling, explanation of diagnosis, planning of further management, and coordination of care.   Thank you for allowing me to participate in patient's care.  If I can answer any additional questions, I would be pleased to do so.    Sincerely,    Donika K. PPosey Pronto DO

## 2014-02-17 ENCOUNTER — Inpatient Hospital Stay (HOSPITAL_COMMUNITY): Admission: RE | Admit: 2014-02-17 | Payer: Medicare Other | Source: Ambulatory Visit

## 2014-02-17 ENCOUNTER — Encounter (HOSPITAL_COMMUNITY)
Admission: RE | Admit: 2014-02-17 | Discharge: 2014-02-17 | Disposition: A | Discharge status: Home / Self Care | Payer: Medicare Other | Source: Ambulatory Visit | Attending: Neurology | Admitting: Neurology

## 2014-02-17 DIAGNOSIS — M6281 Muscle weakness (generalized): Secondary | ICD-10-CM | POA: Insufficient documentation

## 2014-02-17 DIAGNOSIS — G609 Hereditary and idiopathic neuropathy, unspecified: Secondary | ICD-10-CM | POA: Diagnosis not present

## 2014-02-17 MED ORDER — SODIUM CHLORIDE 0.9 % IV SOLN
1000.0000 mg | INTRAVENOUS | Status: DC
  Administered 2014-02-17: 1000 mg via INTRAVENOUS
  Filled 2014-02-17: qty 8

## 2014-02-21 ENCOUNTER — Encounter: Payer: Self-pay | Admitting: Hematology & Oncology

## 2014-02-21 ENCOUNTER — Ambulatory Visit (HOSPITAL_BASED_OUTPATIENT_CLINIC_OR_DEPARTMENT_OTHER): Payer: Medicare Other | Admitting: Hematology & Oncology

## 2014-02-21 ENCOUNTER — Ambulatory Visit (HOSPITAL_BASED_OUTPATIENT_CLINIC_OR_DEPARTMENT_OTHER): Payer: Medicare Other | Admitting: Lab

## 2014-02-21 VITALS — BP 166/96 | HR 61 | Temp 98.1°F | Resp 18 | Ht 70.0 in | Wt 192.0 lb

## 2014-02-21 DIAGNOSIS — D509 Iron deficiency anemia, unspecified: Secondary | ICD-10-CM

## 2014-02-21 DIAGNOSIS — R7989 Other specified abnormal findings of blood chemistry: Secondary | ICD-10-CM | POA: Diagnosis not present

## 2014-02-21 DIAGNOSIS — G610 Guillain-Barre syndrome: Secondary | ICD-10-CM | POA: Diagnosis not present

## 2014-02-21 DIAGNOSIS — R945 Abnormal results of liver function studies: Secondary | ICD-10-CM

## 2014-02-21 DIAGNOSIS — M6281 Muscle weakness (generalized): Secondary | ICD-10-CM

## 2014-02-21 DIAGNOSIS — E291 Testicular hypofunction: Secondary | ICD-10-CM | POA: Diagnosis not present

## 2014-02-21 LAB — CMP (CANCER CENTER ONLY)
ALT: 79 U/L — AB (ref 10–47)
AST: 129 U/L — AB (ref 11–38)
Albumin: 3.7 g/dL (ref 3.3–5.5)
Alkaline Phosphatase: 61 U/L (ref 26–84)
BILIRUBIN TOTAL: 1.1 mg/dL (ref 0.20–1.60)
BUN, Bld: 16 mg/dL (ref 7–22)
CALCIUM: 9.5 mg/dL (ref 8.0–10.3)
CO2: 27 mEq/L (ref 18–33)
Chloride: 96 mEq/L — ABNORMAL LOW (ref 98–108)
Creat: 1 mg/dl (ref 0.6–1.2)
Glucose, Bld: 98 mg/dL (ref 73–118)
Potassium: 3.5 mEq/L (ref 3.3–4.7)
Sodium: 139 mEq/L (ref 128–145)
Total Protein: 7.4 g/dL (ref 6.4–8.1)

## 2014-02-21 LAB — CBC WITH DIFFERENTIAL (CANCER CENTER ONLY)
BASO#: 0 10*3/uL (ref 0.0–0.2)
BASO%: 0.2 % (ref 0.0–2.0)
EOS%: 0.4 % (ref 0.0–7.0)
Eosinophils Absolute: 0 10*3/uL (ref 0.0–0.5)
HCT: 38.2 % — ABNORMAL LOW (ref 38.7–49.9)
HGB: 13.1 g/dL (ref 13.0–17.1)
LYMPH#: 1.2 10*3/uL (ref 0.9–3.3)
LYMPH%: 24.5 % (ref 14.0–48.0)
MCH: 29.8 pg (ref 28.0–33.4)
MCHC: 34.3 g/dL (ref 32.0–35.9)
MCV: 87 fL (ref 82–98)
MONO#: 0.9 10*3/uL (ref 0.1–0.9)
MONO%: 18.2 % — ABNORMAL HIGH (ref 0.0–13.0)
NEUT#: 2.7 10*3/uL (ref 1.5–6.5)
NEUT%: 56.7 % (ref 40.0–80.0)
Platelets: 154 10*3/uL (ref 145–400)
RBC: 4.39 10*6/uL (ref 4.20–5.70)
RDW: 13.6 % (ref 11.1–15.7)
WBC: 4.7 10*3/uL (ref 4.0–10.0)

## 2014-02-21 LAB — CHCC SATELLITE - SMEAR

## 2014-02-22 LAB — IRON AND TIBC CHCC
%SAT: 49 % (ref 20–55)
IRON: 152 ug/dL (ref 42–163)
TIBC: 313 ug/dL (ref 202–409)
UIBC: 161 ug/dL (ref 117–376)

## 2014-02-22 LAB — FERRITIN CHCC: Ferritin: 774 ng/ml — ABNORMAL HIGH (ref 22–316)

## 2014-02-26 LAB — IGG, IGA, IGM
IGA: 591 mg/dL — AB (ref 68–379)
IGG (IMMUNOGLOBIN G), SERUM: 1070 mg/dL (ref 650–1600)
IgM, Serum: 115 mg/dL (ref 41–251)

## 2014-02-26 LAB — PROTEIN ELECTROPHORESIS, SERUM, WITH REFLEX
Albumin ELP: 50.5 % — ABNORMAL LOW (ref 55.8–66.1)
Alpha-1-Globulin: 7.4 % — ABNORMAL HIGH (ref 2.9–4.9)
Alpha-2-Globulin: 11.6 % (ref 7.1–11.8)
Beta 2: 8.6 % — ABNORMAL HIGH (ref 3.2–6.5)
Beta Globulin: 7.3 % — ABNORMAL HIGH (ref 4.7–7.2)
GAMMA GLOBULIN: 14.6 % (ref 11.1–18.8)
M-Spike, %: 0.34 g/dL
TOTAL PROTEIN, SERUM ELECTROPHOR: 7 g/dL (ref 6.0–8.3)

## 2014-02-26 LAB — KAPPA/LAMBDA LIGHT CHAINS
KAPPA LAMBDA RATIO: 0.75 (ref 0.26–1.65)
Kappa free light chain: 1.76 mg/dL (ref 0.33–1.94)
LAMBDA FREE LGHT CHN: 2.35 mg/dL (ref 0.57–2.63)

## 2014-02-26 LAB — CRYOGLOBULIN

## 2014-02-26 LAB — IFE INTERPRETATION

## 2014-02-26 LAB — RHEUMATOID FACTOR

## 2014-03-24 ENCOUNTER — Encounter (HOSPITAL_COMMUNITY)
Admission: RE | Admit: 2014-03-24 | Discharge: 2014-03-24 | Disposition: A | Discharge status: Home / Self Care | Payer: Medicare Other | Source: Ambulatory Visit | Attending: Neurology | Admitting: Neurology

## 2014-03-24 ENCOUNTER — Encounter: Payer: Self-pay | Admitting: Internal Medicine

## 2014-03-24 ENCOUNTER — Ambulatory Visit (INDEPENDENT_AMBULATORY_CARE_PROVIDER_SITE_OTHER): Payer: Medicare Other | Admitting: *Deleted

## 2014-03-24 DIAGNOSIS — I495 Sick sinus syndrome: Secondary | ICD-10-CM

## 2014-03-24 DIAGNOSIS — G609 Hereditary and idiopathic neuropathy, unspecified: Secondary | ICD-10-CM | POA: Diagnosis not present

## 2014-03-24 DIAGNOSIS — M6281 Muscle weakness (generalized): Secondary | ICD-10-CM | POA: Insufficient documentation

## 2014-03-24 LAB — MDC_IDC_ENUM_SESS_TYPE_REMOTE
Battery Impedance: 158 Ohm
Brady Statistic AP VS Percent: 0 %
Brady Statistic AS VP Percent: 0 %
Date Time Interrogation Session: 20151112123527
Lead Channel Impedance Value: 495 Ohm
Lead Channel Impedance Value: 607 Ohm
Lead Channel Pacing Threshold Amplitude: 0.375 V
Lead Channel Pacing Threshold Amplitude: 0.75 V
Lead Channel Pacing Threshold Pulse Width: 0.4 ms
Lead Channel Sensing Intrinsic Amplitude: 16 mV
Lead Channel Sensing Intrinsic Amplitude: 2.8 mV
MDC IDC MSMT BATTERY REMAINING LONGEVITY: 145 mo
MDC IDC MSMT BATTERY VOLTAGE: 2.78 V
MDC IDC MSMT LEADCHNL RV PACING THRESHOLD PULSEWIDTH: 0.4 ms
MDC IDC SET LEADCHNL RA PACING AMPLITUDE: 2 V
MDC IDC SET LEADCHNL RV PACING AMPLITUDE: 2.5 V
MDC IDC SET LEADCHNL RV PACING PULSEWIDTH: 0.4 ms
MDC IDC SET LEADCHNL RV SENSING SENSITIVITY: 2.8 mV
MDC IDC STAT BRADY AP VP PERCENT: 0 %
MDC IDC STAT BRADY AS VS PERCENT: 99 %

## 2014-03-24 MED ORDER — SODIUM CHLORIDE 0.9 % IV SOLN
1000.0000 mg | INTRAVENOUS | Status: DC
  Administered 2014-03-24: 1000 mg via INTRAVENOUS
  Filled 2014-03-24: qty 8

## 2014-03-25 NOTE — Progress Notes (Signed)
Remote pacemaker transmission.   

## 2014-03-30 ENCOUNTER — Other Ambulatory Visit: Payer: Self-pay | Admitting: Hematology & Oncology

## 2014-03-30 DIAGNOSIS — E8809 Other disorders of plasma-protein metabolism, not elsewhere classified: Secondary | ICD-10-CM

## 2014-03-30 NOTE — Progress Notes (Signed)
Hematology and Oncology Follow Up Visit  Jonathon [name] [id number] 06-02-1935 78 y.o. 03/30/2014   Principle Diagnosis:  Iron deficiency anemia Hypotestosteronemia   Current Therapy:    IV iron as indicated  Testosterone gel supplementation     Interim History:  Mr.  [name] is back for followup. He is being seen by neurology. They are doing a great job with him. They've done quite a few studies on him. There were about the possibility of CIDP. They think this might be possible. There is also a concern for POEMS. They think this might be the case because he has a monoclonal gammopathy.  He has had a spinal tap. He has increase in oligoclonal bands with normal cell count, IgG index, and myelin basic proteins.  He has had a MRI of the spine. He has severe spinal stenosis in the cervical spine. He has some small stenosis in the lumbar spine.  He's been given some pulse steroid doses. This has been helping his legs and strength.  He feels better. He feels a little stronger. He is working.  He still does have some alcohol use. He drinks a couple beers a day.  We last saw him, his ferritin was 1545 with an iron saturation 37%. We last saw him, there is no obvious monoclonal spike in his serum. His IgA level was 614 mg/dL.  He's had no fever. He's had no rashes. His had no change in bowel or bladder habits.    Medications: Current outpatient prescriptions: cholecalciferol (VITAMIN D) 400 UNITS TABS tablet, Take 400 Units by mouth daily. , Disp: , Rfl: ;  Cyanocobalamin (VITAMIN B 12 PO), Take 1,000 mg by mouth daily., Disp: , Rfl: ;  diltiazem (CARDIZEM LA) 240 MG 24 hr tablet, Take 1 tablet (240 mg total) by mouth daily., Disp: 90 tablet, Rfl: 3;  Garlic 916 MG CAPS, Take 1,000 mg by mouth every morning. , Disp: , Rfl:  Niacin (VITAMIN B-3 PO), Take by mouth every morning., Disp: , Rfl: ;  Omega-3 Fatty Acids (FISH OIL) 1000 MG CAPS, Take by mouth every morning., Disp: , Rfl: ;  tamsulosin  (FLOMAX) 0.4 MG CAPS capsule, TAKE 1 CAPSULE BY MOUTH DAILY, Disp: 90 capsule, Rfl: 1;  testosterone (ANDROGEL) 50 MG/5GM (1%) GEL, Place 5 g onto the skin daily., Disp: 30 Package, Rfl: 3 No current facility-administered medications for this visit. Facility-Administered Medications Ordered in Other Visits: methylPREDNISolone sodium succinate (SOLU-MEDROL) injection 1,000 mg, 1,000 mg, Intravenous, Once, Donika K Patel, DO;  methylPREDNISolone sodium succinate (SOLU-MEDROL) injection 1,000 mg, 1,000 mg, Intravenous, Once, Donika Keith Rake, DO  Allergies: No Known Allergies  Past Medical History, Surgical history, Social history, and Family History were reviewed and updated.  Review of Systems: As above  Physical Exam:  height is 5\' 10"  (1.778 m) and weight is 192 lb (87.091 kg). His oral temperature is 98.1 F (36.7 C). His blood pressure is 166/96 and his pulse is 61. His respiration is 18.   Well-developed and well-nourished African regular. Head and neck exam shows no ocular or oral lesion. He has no palpable cervical or supraclavicular lymph nodes. Lungs are clear bilaterally. Cardiac exam regular in rhythm with no murmurs rubs or bruits. Abdomen is soft. Has good bowel sounds. There is no fluid wave. There is no palpable liver or spleen tip. Back exam no tenderness over the spine ribs or hips. He has no tenderness over the muscles. Extremities shows no clubbing cyanosis or edema. He has good strength  in his extremities. Good range of motion of his joints. Skin exam shows no rashes, ecchymosis or petechia. Neurological exam shows no focal neurological deficits.  Lab Results  Component Value Date   WBC 4.7 02/21/2014   HGB 13.1 02/21/2014   HCT 38.2* 02/21/2014   MCV 87 02/21/2014   PLT 154 02/21/2014     Chemistry      Component Value Date/Time   NA 139 02/21/2014 1058   NA 136 10/27/2013 1327   K 3.5 02/21/2014 1058   K 4.0 10/27/2013 1327   CL 96* 02/21/2014 1058   CL 104  10/27/2013 1327   CO2 27 02/21/2014 1058   CO2 22 10/27/2013 1327   BUN 16 02/21/2014 1058   BUN 16 10/27/2013 1327   CREATININE 1.0 02/21/2014 1058   CREATININE 0.98 10/27/2013 1327      Component Value Date/Time   CALCIUM 9.5 02/21/2014 1058   CALCIUM 8.3* 10/27/2013 1327   ALKPHOS 61 02/21/2014 1058   ALKPHOS 66 10/27/2013 1327   AST 129* 02/21/2014 1058   AST 43* 10/27/2013 1327   ALT 79* 02/21/2014 1058   ALT 56* 10/27/2013 1327   BILITOT 1.10 02/21/2014 1058   BILITOT 1.5* 10/27/2013 1327      Ferritin is 774. Iron saturation is 49%. Total iron is 152. Monoclonal spike is 0.34g/ l. His IgA level is 591 mg/dL   Impression and Plan: Jonathon [name] is a 78 year old male with iron deficiency anemia. We have taken care of this pretty well. His iron studies are normal.  He does have some mildly elevated liver function tests. I think this is been looked at before. A CT scan done back in April showed a fatty liver.  I really cannot make much of the monoclonal spike. I really don't think this is part of his neurological issue.  He has been seen by neurology. There are very thorough and their evaluation.  We will plan to get him back in another couple months.  Volanda Napoleon, MD 11/18/20155:16 PM

## 2014-04-05 ENCOUNTER — Other Ambulatory Visit: Payer: Self-pay | Admitting: Hematology & Oncology

## 2014-04-06 ENCOUNTER — Other Ambulatory Visit: Payer: Self-pay | Admitting: *Deleted

## 2014-04-21 ENCOUNTER — Encounter (HOSPITAL_COMMUNITY)
Admission: RE | Admit: 2014-04-21 | Discharge: 2014-04-21 | Disposition: A | Discharge status: Home / Self Care | Payer: Medicare Other | Source: Ambulatory Visit | Attending: Neurology | Admitting: Neurology

## 2014-04-21 ENCOUNTER — Encounter (HOSPITAL_COMMUNITY): Payer: Self-pay | Admitting: Internal Medicine

## 2014-04-21 DIAGNOSIS — G609 Hereditary and idiopathic neuropathy, unspecified: Secondary | ICD-10-CM | POA: Insufficient documentation

## 2014-04-21 DIAGNOSIS — M6281 Muscle weakness (generalized): Secondary | ICD-10-CM | POA: Insufficient documentation

## 2014-04-21 MED ORDER — SODIUM CHLORIDE 0.9 % IV SOLN
1000.0000 mg | INTRAVENOUS | Status: DC
  Administered 2014-04-21: 1000 mg via INTRAVENOUS
  Filled 2014-04-21: qty 8

## 2014-04-28 ENCOUNTER — Encounter: Payer: Self-pay | Admitting: Cardiology

## 2014-04-29 ENCOUNTER — Other Ambulatory Visit: Payer: Self-pay | Admitting: Internal Medicine

## 2014-05-16 ENCOUNTER — Encounter: Payer: Self-pay | Admitting: Neurology

## 2014-05-16 ENCOUNTER — Ambulatory Visit (INDEPENDENT_AMBULATORY_CARE_PROVIDER_SITE_OTHER): Payer: Medicare Other | Admitting: Neurology

## 2014-05-16 VITALS — BP 170/80 | HR 121 | Wt 198.0 lb

## 2014-05-16 DIAGNOSIS — G6181 Chronic inflammatory demyelinating polyneuritis: Secondary | ICD-10-CM

## 2014-05-16 MED ORDER — METHYLPREDNISOLONE SODIUM SUCC 1000 MG IJ SOLR: 1000.0000 mg | Freq: Once | INTRAMUSCULAR | Status: DC

## 2014-05-16 NOTE — Progress Notes (Signed)
[facility]  Follow-up Visit   Date: 05/16/2014   Jonathon [name] MRN: [id number] DOB: [birth year]   Interim History: Jonathon [name] is a 79 y.o. 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    History of present illness: Patient was in his usual state of health until 2013. He has always been physical active and would go to the gym daily (walking up 10 miles/day). During the spring of 2013, he developed relatively subacute onset of left > right hamstring soreness without an inciting event. Pain initially started in the butt area and radiated down. There is associated proximal leg weakness and loss of muscle mass. Functionally, he is still able to complete all his usual activities, but has became much more cautious when climbing stairs.   - Follow-up 02/01/2013:  He continues to have weakness of his proximal legs, worse on the left. EMG did not show any myopathy or radiculopathy of the left lower extremity.  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  CK, aldolase, B12 was within normal limits. SPEP/UPEP shows monoclonal IgA kappa gammopathy and he underwent extensive hematologic testing, including bone marrow biopsy by Dr. Marin Olp which has been nondiagnostic.    - Follow-up 08/10/2013: Progressive loss of muscle bulk over the upper arms and proximal legs. He has difficulty with climbing stairs and opening jars, and dyspnea on exertion. He feels unsteady at times, denies any falls. 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   He reports previously drinking 4 beers/day and on the weekends up to 6 beers/day.   He also reports having 15lb unintentional weight loss and has been eating little.    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  CK is normal, aldolase elevated, and GM1 antibody is negative. There is evidence of severe multilevel spinal stenosis of the cervical spine, and milder findings in the lumbar region.  Clinically, his remains stable without any significant worsening.    - Follow-up 10/12/2013:  CSF studies performed shows increase oligoclonal bands with normal cell count, IgG index, and myelin basic proteins.  He feels that his legs continue to worsen.     - Follow-up 11/09/2013:  He completed 3-days of solumedrol (6/8/-6/10) and since starting steroids, his muscle soreness has significantly improved.   His appetite is better and he gained 5lb over the past month.  Mood remains good.  He is still drinking 2 beers/nightly and 6 beers/d on the weekends.  Anti-MAG returned negative.  UPDATE 02/09/2014:  He continues to get monthly steroid infusions and has been tolerating it well. There has been no worsening or notable change in muscle strength that he reports, although he has been going to the gym and pushing 85 pounds with leg exercises. He has no interval falls.      UPDATE 05/16/2014:  He completed 36-month of monthly IVMP and reports little benefit over the past few months, but no worsening.  He continues to have leg weakness and early fatigue.  He does not feel that his hands or legs are weaker and has been able to increase the weight on his biceps curls ([facility]  He also quit drinking his 6-pack of beer last week.  His greatest difficulty is endurance of  his legs because he cannot walk long distances any more.  No falls.  He endorses jittery feeling at times and imbalance.  His BP and HR is elevated today which may be due to the fact that he has not taking his morning medications.  He continues to gain weight and is now back to his baseline weight (198lb).   Medications:  Current Outpatient Prescriptions on File Prior to  Visit  Medication Sig Dispense Refill  . cholecalciferol (VITAMIN D) 400 UNITS TABS tablet Take 400 Units by mouth daily.     . Cyanocobalamin (VITAMIN B 12 PO) Take 1,000 mg by mouth daily.    Marland Kitchen diltiazem (CARDIZEM LA) 240 MG 24 hr tablet Take 1 tablet (240 mg total) by mouth daily. 90 tablet 3  . Garlic 159 MG CAPS Take 1,000 mg by mouth every morning.     . irbesartan (AVAPRO) 300 MG tablet TAKE 1 TABLET BY MOUTH EVERY DAY 90 tablet 0  . Multiple Vitamin (MULTIVITAMIN) tablet Take 1 tablet by mouth daily. Centrum for men    . Niacin (VITAMIN B-3 PO) Take by mouth every morning.    . Omega-3 Fatty Acids (FISH OIL) 1000 MG CAPS Take by mouth every morning.    . tamsulosin (FLOMAX) 0.4 MG CAPS capsule TAKE 1 CAPSULE BY MOUTH DAILY 90 capsule 1  . testosterone (ANDROGEL) 50 MG/5GM (1%) GEL APPLY 1 PACKET ONTO THE SKIN DAILY 150 g 0   Current Facility-Administered Medications on File Prior to Visit  Medication Dose Route Frequency Provider Last Rate Last Dose  . methylPREDNISolone sodium succinate (SOLU-MEDROL) injection 1,000 mg  1,000 mg Intravenous Once Amadeo Coke K Ziah Leandro, DO      . methylPREDNISolone sodium succinate (SOLU-MEDROL) injection 1,000 mg  1,000 mg Intravenous Once Andora Krull K Tyshana Nishida, DO        Allergies: No Known Allergies   Review of Systems:  CONSTITUTIONAL: No fevers, chills, night sweats EYES: No visual changes or eye pain ENT: No hearing changes.  No history of nose bleeds.   RESPIRATORY: No cough, wheezing and shortness of breath.   CARDIOVASCULAR: Negative for chest pain, and palpitations.   GI: Negative for abdominal discomfort, blood in stools or black stools.  No recent change in bowel habits.   GU:  No history of incontinence.   MUSCLOSKELETAL: No history of joint pain or swelling.  No myalgias.   SKIN: Negative for lesions, rash, and itching.   HEMATOLOGY/ONCOLOGY: Negative for prolonged bleeding, bruising easily, and swollen nodes.  ENDOCRINE: Negative for cold or  heat intolerance, polydipsia or goiter.   PSYCH:  +depression or anxiety symptoms.   NEURO: As Above.   Vital Signs:  BP 170/80 mmHg  Pulse 121  Wt 198 lb (89.812 kg)  SpO2 96%  Neurological Exam: MENTAL STATUS including orientation to time, place, person, recent and remote memory, attention span and concentration, language, and fund of knowledge is normal.  Speech is not dysarthric.  CRANIAL NERVES:  Pupils are round and reactive to light.  Normal conjugate, extra-ocular eye movements in all directions of gaze. No ptosis.  Face is symmetric.  Palate elevates symmetrically.  Normal shoulder shrug and head rotation.  Normal tongue strength and range of motion, no deviation or fasciculation.  MOTOR:  Bilateral moderate ADM, FDI, ABP and moderate bilateral quadriceps atrophy.  No fasciculations.  Mild resting head tremor.  No pronator drift.  Neck flexion and extension is 5/5  Right Upper Extremity:    Left Upper Extremity:  Deltoid  5/5   Deltoid  5/5   Biceps  5/5   Biceps  5/5   Triceps  5/5   Triceps  5/5   Wrist extensors  5/5   Wrist extensors  5/5   Wrist flexors  5/5   Wrist flexors  5/5   Finger extensors  5/5   Finger extensors  5/5   Finger flexors  5/5   Finger flexors  5/5   Dorsal interossei  4/5   Dorsal interossei  4/5   Abductor pollicis  4+/5   Abductor pollicis * 5-/5   Tone (Ashworth scale)  0  Tone (Ashworth scale)  0   Right Lower Extremity:    Left Lower Extremity:    Hip flexors * 5/5   Hip flexors * 5/5   Hip extensors  5/5   Hip extensors  5/5   Knee flexors  5/5   Knee flexors  5/5   Knee extensors  5/5   Knee extensors  5/5   Dorsiflexors  5/5   Dorsiflexors  5/5   Plantarflexors  5/5   Plantarflexors  5/5   Toe extensors  5/5   Toe extensors  5/5   Toe flexors  5/5   Toe flexors  5/5   Tone (Ashworth scale)  0  Tone (Ashworth scale)  0   MSRs:  Reflexes are 2+/4 in upper extremities, 1+ patella, and absent at Achilles.  Down-going plantar  responses.  SENSORY: * Vibration and temperature reduced distal to right ankle.  Pin prick intact throughout.    COORDINATION/GAIT:  Intact rapid alternating movements bilaterally.  Able to rise from a chair without using arms.  Gait narrow based and stable. Tandem and stressed gait intact.  He is able to perform squat and get up without difficulty  Data: Labs 01/13/2013:  CK 55, aldolase 7.9, TSH 0.39, copper 97, ceruloplasmin 30,  Labs 07/2013:  vitamin  B12 519, ANA neg, RF neg, ESR 5 Labs 08/10/2013:  CK 91, aldolase 14.9*, GM1 antibody - neg Labs 11/09/2013:  VEGF 118* (normal 31-86)  CSF 09/24/2013:  R1 W0 G59 Protein 42, IgG index 4.4, MBP < 2.0, WNV - neg, cytology - neg, OCB - 3 well defined bands present in CSF and serum, more prominent in CSF  EMG 01/25/2013:   1. 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  2. Left ulnar neuropathy at the elbow, moderately severe in degree electrically. A superimposed C8 motor radiculopathy cannot be excluded. 3. No evidence of diffuse motor axon loss or a generalized myopathy.   EMG 09/16/2013: NCV & EMG Findings:  Extensive evaluation of the left upper and lower extremities with additional studies of the mid-thoracic paraspinal (T11 level) muscles reveals:  1.  The median sensory response is prolonged (4.2 ms) and reduced in amplitude (8.6 V). The ulnar sensory response is absent. The radial sensory response is normal.  2.  Normal sural and superficial peroneal sensory responses. 3.  The median motor response is prolonged (4.9 ms) and reduced in amplitude(4.5 mV).. The ulnar motor response is reduced in amplitude (6.0 mV), with evidence of conduction block in the forearm and across the elbow, and has decreased conduction velocity (A Elbow-B Elbow, 20 m/s).  4.  The left peroneal motor nerve showed decreased conduction velocity (Poplt-B Fib, 29  m/s). The left tibial motor nerve showed decreased conduction velocity (Knee-Ankle, 33 m/s).  5.  F Wave studies indicate that the left tibial F wave has prolonged latency (62.85 ms). All remaining F Wave latencies were within normal limits.  6.  H-reflex studies indicate that the left tibial H-reflex has prolonged latency (46.26 ms). The right tibial H-reflex has prolonged latency (39.32 ms).  7.  There is evidence of diffuse and generalized chronic motor axon loss changes affecting the aorta the tested muscles of the upper and lower extremities, with accompanied active denervation. No fasciculation potentials are seen.  8.  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 When compared to his previous EMG dated 01/25/2013, there is an interval worsening of findings.  CT cervical spine and lumbar spine 08/17/2013: 1. Advanced degenerative changes in the cervical spine, including multilevel bulky anterior endplate osteophytes.  2. Suspect multifactorial moderate to severe cervical spinal stenosis at the C5-C6 and C6-C7 levels. As the patient is not a  candidate for MRI, cervical spine CT myelogram would confirm.  3. Associated severe multifactorial neural foraminal stenosis at the right C4, right C5, right C6, bilateral C7, and bilateral C8 nerve levels.  4. Mild for age lumbar spine degenerative changes, chiefly facet arthropathy. Up to mild multifactorial spinal stenosis at L2-L3 and L3-L4.    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   His electrodiagnostic testing last performed in May 2015 showed progression of axon loss and demyelinating features affecting the left side, most suggestive of an acquired polyradiculoneuropathy.  He underwent CSF testing which showed normal cell count and protein. 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     Labs showed monoclonal IgA kappa gammopathy on serum for which he underwent extensive evaluation by Dr. Marin Olp, including bone marrow biopsy which was negative.  He also has transaminatis likely due to fatty liver.  The possibility of POEMS was raised given elevated VEGF, but hematology did not feel he has this.  He has underwent solumedrol 1g x 3 days in early June 2015 and has been getting monthly IVMP since then.  I will proceed in treating him for CIDP with IVMP since there has been slightly improvement in his motor and sensory exam.  His persistent hand weakness persists and may also be due to superimposed cervical foraminal stenosis at this level.  Consider doing EMG in 3-6 months to re-evaluate neuropathy.  His BP and HR is elevated, may be due to not taking his medication.  I have asked him to monitor this and if it remains elevated to f/u with his PCP/cardiologist.   PLAN: Continue solumedrol 1g monthly and follow him closely.   Consider repeat EMG in 3-6 months Encouraged him to avoid alcohol as this can worsen neuropathy Return to clinic in 82-month.    The duration of this appointment visit was 30 minutes of face-to-face time with the patient.  Greater than 50% of this time was spent in counseling, explanation of diagnosis, planning of further management, and coordination of care.   Thank you for allowing me to participate in patient's care.  If I  can answer any additional questions, I would be pleased  to do so.    Sincerely,    Namiah Dunnavant K. Posey Pronto, DO

## 2014-05-16 NOTE — Patient Instructions (Addendum)
1. Continue your monthly steroid infusions 2.  If your balance becomes more of an issue, please contact my office so we can start you in balance therapy 3. I will see you back in January, or sooner as needed

## 2014-05-20 ENCOUNTER — Telehealth: Payer: Self-pay | Admitting: Neurology

## 2014-05-20 NOTE — Telephone Encounter (Signed)
Pt called wanting to f/u to see when he is scheduled to have his steroid injection at the hospital Please call pt # [phone]

## 2014-05-20 NOTE — Telephone Encounter (Signed)
Left message for patient that his IV appointment is on Jan. 13 at 10:00.

## 2014-05-23 ENCOUNTER — Encounter: Payer: Self-pay | Admitting: Family

## 2014-05-23 ENCOUNTER — Ambulatory Visit (HOSPITAL_BASED_OUTPATIENT_CLINIC_OR_DEPARTMENT_OTHER): Payer: Medicare Other | Admitting: Lab

## 2014-05-23 ENCOUNTER — Ambulatory Visit (HOSPITAL_BASED_OUTPATIENT_CLINIC_OR_DEPARTMENT_OTHER): Payer: Medicare Other | Admitting: Family

## 2014-05-23 DIAGNOSIS — R7989 Other specified abnormal findings of blood chemistry: Secondary | ICD-10-CM | POA: Diagnosis not present

## 2014-05-23 DIAGNOSIS — D509 Iron deficiency anemia, unspecified: Secondary | ICD-10-CM | POA: Diagnosis not present

## 2014-05-23 DIAGNOSIS — M6281 Muscle weakness (generalized): Secondary | ICD-10-CM

## 2014-05-23 DIAGNOSIS — E291 Testicular hypofunction: Secondary | ICD-10-CM

## 2014-05-23 DIAGNOSIS — R945 Abnormal results of liver function studies: Secondary | ICD-10-CM

## 2014-05-23 LAB — CBC WITH DIFFERENTIAL (CANCER CENTER ONLY)
BASO#: 0 10*3/uL (ref 0.0–0.2)
BASO%: 0.6 % (ref 0.0–2.0)
EOS%: 5 % (ref 0.0–7.0)
Eosinophils Absolute: 0.2 10*3/uL (ref 0.0–0.5)
HCT: 42.2 % (ref 38.7–49.9)
HGB: 14.3 g/dL (ref 13.0–17.1)
LYMPH#: 2.3 10*3/uL (ref 0.9–3.3)
LYMPH%: 63.9 % — ABNORMAL HIGH (ref 14.0–48.0)
MCH: 29.7 pg (ref 28.0–33.4)
MCHC: 33.9 g/dL (ref 32.0–35.9)
MCV: 88 fL (ref 82–98)
MONO#: 0.5 10*3/uL (ref 0.1–0.9)
MONO%: 14.6 % — ABNORMAL HIGH (ref 0.0–13.0)
NEUT#: 0.6 10*3/uL — ABNORMAL LOW (ref 1.5–6.5)
NEUT%: 15.9 % — AB (ref 40.0–80.0)
Platelets: 179 10*3/uL (ref 145–400)
RBC: 4.82 10*6/uL (ref 4.20–5.70)
RDW: 14 % (ref 11.1–15.7)
WBC: 3.6 10*3/uL — AB (ref 4.0–10.0)

## 2014-05-23 LAB — CMP (CANCER CENTER ONLY)
ALBUMIN: 3.1 g/dL — AB (ref 3.3–5.5)
ALT: 180 U/L — AB (ref 10–47)
AST: 271 U/L — AB (ref 11–38)
Alkaline Phosphatase: 77 U/L (ref 26–84)
BUN, Bld: 10 mg/dL (ref 7–22)
CO2: 28 mEq/L (ref 18–33)
Calcium: 9.1 mg/dL (ref 8.0–10.3)
Chloride: 104 mEq/L (ref 98–108)
Creat: 1.2 mg/dl (ref 0.6–1.2)
Glucose, Bld: 90 mg/dL (ref 73–118)
Potassium: 3.7 mEq/L (ref 3.3–4.7)
Sodium: 142 mEq/L (ref 128–145)
Total Bilirubin: 0.6 mg/dl (ref 0.20–1.60)
Total Protein: 7.8 g/dL (ref 6.4–8.1)

## 2014-05-23 LAB — FERRITIN CHCC: Ferritin: 406 ng/ml — ABNORMAL HIGH (ref 22–316)

## 2014-05-23 LAB — IRON AND TIBC CHCC
%SAT: 15 % — ABNORMAL LOW (ref 20–55)
Iron: 53 ug/dL (ref 42–163)
TIBC: 344 ug/dL (ref 202–409)
UIBC: 291 ug/dL (ref 117–376)

## 2014-05-23 MED ORDER — TESTOSTERONE 50 MG/5GM (1%) TD GEL: TRANSDERMAL | Status: DC

## 2014-05-23 NOTE — Progress Notes (Signed)
[place]  [phone]) [phone] [phone]) [phone]  ID: Jonathon [name] OB: May 29, 1935 MR#: [id number] HXT#:[id number] Patient Care Team: Marletta Lor, MD as PCP - General [name], MD (Cardiology) [name], MD (Cardiology)  DIAGNOSIS: Iron deficiency anemia Hypotestosteronemia  INTERVAL HISTORY: Jonathon [name] is here today for a follow-up. He is feeling better. He is still having weakness in his legs. He is followed by neurology for this. They are still unsure as to why he has the tremors and muscle wasting.  He is receiving steroid injections to help with strengthening his legs. He states that he stopped drinking alcohol on January first. His liver enzymes are significantly elevated.  He denies fever, chills, n/v, cough, rash, headache, dizziness, SOB, chest pain, palpitations, abdominal pain, constipation, diarrhea, blood in urine ro stool.  No swelling, tenderness, numbness or tingling in his extremities.  His appetite is good and he is trying to drink lots of fluids. His weight is stable at 184 lbs. In October, his ferritin was 774 with an iron saturation 49%. His M-spike was 0.34, IgA level was 591 mg/dL. He is using his androgel daily.   CURRENT TREATMENT: IV iron as indicated Testosterone gel supplementation  REVIEW OF SYSTEMS: All other 10 point review of systems is negative.   PAST MEDICAL HISTORY: Past Medical History  Diagnosis Date  . BENIGN PROSTATIC HYPERTROPHY, HX OF 10/25/2008  . BRADYCARDIA 10/25/2008  . HYPERTENSION 11/21/2006  . 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 PACEMAKER, PERMANENT 10/25/2008  . TOBACCO ABUSE 10/25/2008    Quit 2012  . Iron deficiency anemia, unspecified 04/19/2013  . Asthma     PAST SURGICAL HISTORY: Past Surgical History  Procedure Laterality Date  . Pacemaker insertion  2005  . Colonoscopy  [id number],[id number]  . Skin graft Right 1962    wrist  . Permanent pacemaker generator change  N/A 06/19/2012    Procedure: PERMANENT PACEMAKER GENERATOR CHANGE;  Surgeon: [name], MD;  Location: [facility] CATH LAB;  Service: Cardiovascular;  Laterality: N/A;    FAMILY HISTORY Family History  Problem Relation Age of Onset  . Breast cancer Sister     Living, 77  . Diabetes type II Brother     Living, 66  . Breast cancer Sister     Living, 34  . Heart attack Father     Died, 25  . Kidney disease Father   . Parkinson's disease Mother     Died, 71  . Diabetes Daughter     Living, 77  . Diabetes Son     Living, 45  . Colon cancer Neg Hx   . Rectal cancer Neg Hx   . Stomach cancer Neg Hx   . Ovarian cancer Daughter     GYNECOLOGIC HISTORY:  No LMP for male patient.   SOCIAL HISTORY: History   Social History  . Marital Status: Divorced    Spouse Name: N/A    Number of Children: 55  . Years of Education: N/A   Occupational History  . ENVIROMENTAL SERVICE    Social History Main Topics  . Smoking status: Former Smoker -- 0.25 packs/day for 46 years    Types: Cigarettes    Start date: 03/16/1965    Quit date: 06/15/2010  . Smokeless tobacco: Never Used     Comment: quit 3 years ago  . Alcohol Use: 0.0 oz/week     Comment: 4 beers per day  . Drug Use: No  . Sexual  Activity: Not on file   Other Topics Concern  . 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    ADVANCED DIRECTIVES:  <no information>  HEALTH MAINTENANCE: History  Substance Use Topics  . Smoking status: Former Smoker -- 0.25 packs/day for 46 years    Types: Cigarettes    Start date: 03/16/1965    Quit date: 06/15/2010  . Smokeless tobacco: Never Used     Comment: quit 3 years ago  . Alcohol Use: 0.0 oz/week     Comment: 4 beers per day   Colonoscopy: PAP: Bone density: Lipid panel:  No Known Allergies  Current Outpatient Prescriptions  Medication Sig Dispense Refill  . cholecalciferol (VITAMIN D)  400 UNITS TABS tablet Take 400 Units by mouth daily.     . Cyanocobalamin (VITAMIN B 12 PO) Take 1,000 mg by mouth daily.    Marland Kitchen diltiazem (CARDIZEM LA) 240 MG 24 hr tablet Take 1 tablet (240 mg total) by mouth daily. 90 tablet 3  . Garlic 315 MG CAPS Take 1,000 mg by mouth every morning.     . Multiple Vitamin (MULTIVITAMIN) tablet Take 1 tablet by mouth daily. Centrum for men    . Multiple Vitamins-Minerals (CENTRUM ADULTS PO) Take by mouth.    . Niacin (VITAMIN B-3 PO) Take by mouth every morning.    . Omega-3 Fatty Acids (FISH OIL) 1000 MG CAPS Take by mouth every morning.    . tamsulosin (FLOMAX) 0.4 MG CAPS capsule TAKE 1 CAPSULE BY MOUTH DAILY 90 capsule 1  . testosterone (ANDROGEL) 50 MG/5GM (1%) GEL APPLY 1 PACKET ONTO THE SKIN DAILY 150 g 0   No current facility-administered medications for this visit.   Facility-Administered Medications Ordered in Other Visits  Medication Dose Route Frequency Provider Last Rate Last Dose  . methylPREDNISolone sodium succinate (SOLU-MEDROL) injection 1,000 mg  1,000 mg Intravenous Once Donika K Patel, DO      . methylPREDNISolone sodium succinate (SOLU-MEDROL) injection 1,000 mg  1,000 mg Intravenous Once Donika Keith Rake, DO        OBJECTIVE: Filed Vitals:   05/23/14 1105  BP: 142/89  Pulse: 86  Temp: 97.8 F (36.6 C)  Resp: 16    Filed Weights   05/23/14 1105  Weight: 184 lb (83.462 kg)   ECOG FS:1 - Symptomatic but completely ambulatory Ocular: Sclerae unicteric, pupils equal, round and reactive to light Ear-nose-throat: Oropharynx clear, dentition fair Lymphatic: No cervical or supraclavicular adenopathy Lungs no rales or rhonchi, good excursion bilaterally Heart regular rate and rhythm, no murmur appreciated Abd soft, nontender, positive bowel sounds MSK no focal spinal tenderness, no joint edema Neuro: non-focal, well-oriented, appropriate affect  LAB RESULTS: CMP     Component Value Date/Time   NA 142 05/23/2014 1047   NA  136 10/27/2013 1327   K 3.7 05/23/2014 1047   K 4.0 10/27/2013 1327   CL 104 05/23/2014 1047   CL 104 10/27/2013 1327   CO2 28 05/23/2014 1047   CO2 22 10/27/2013 1327   GLUCOSE 90 05/23/2014 1047   GLUCOSE 129* 10/27/2013 1327   BUN 10 05/23/2014 1047   BUN 16 10/27/2013 1327   CREATININE 1.2 05/23/2014 1047   CREATININE 0.98 10/27/2013 1327   CALCIUM 9.1 05/23/2014 1047   CALCIUM 8.3* 10/27/2013 1327   PROT 7.8 05/23/2014 1047   PROT 6.2 10/27/2013 1327   ALBUMIN 3.4* 10/27/2013  1327   AST 271* 05/23/2014 1047   AST 43* 10/27/2013 1327   ALT 180* 05/23/2014 1047   ALT 56* 10/27/2013 1327   ALKPHOS 77 05/23/2014 1047   ALKPHOS 66 10/27/2013 1327   BILITOT 0.60 05/23/2014 1047   BILITOT 1.5* 10/27/2013 1327   GFRNONAA 67* 06/19/2012 1123   GFRAA 78* 06/19/2012 1123   INo results found for: SPEP, UPEP Lab Results  Component Value Date   WBC 3.6* 05/23/2014   NEUTROABS 0.6* 05/23/2014   HGB 14.3 05/23/2014   HCT 42.2 05/23/2014   MCV 88 05/23/2014   PLT 179 05/23/2014   No results found for: LABCA2 No components found for: TLXBW620 No results for input(s): INR in the last 168 hours. Urinalysis No results found for: COLORURINE, APPEARANCEUR, LABSPEC, PHURINE, GLUCOSEU, HGBUR, BILIRUBINUR, KETONESUR, PROTEINUR, UROBILINOGEN, NITRITE, LEUKOCYTESUR STUDIES: No results found.  ASSESSMENT/PLAN: Jonathon [name] is a 79 year old male with iron deficiency anemia. He has had no issues with this. His iron studies have been normal. Today his Hgb was 14.3 and MCV was 88.  He still has elevated LFT's. A CT scan done back in April showed a fatty liver. He has stopped drinking (11 days).  Neurology is still following him closely.  I refilled his Androgen gel prescription.  We will see him back in 3 months for labs and follow-up.  He knows to call here with any questions or concerns and to go to the ED in the event of an emergency. We can certainly see him sooner if need be.    Eliezer Bottom, NP 05/23/2014 1:11 PM

## 2014-05-24 ENCOUNTER — Other Ambulatory Visit: Payer: Self-pay | Admitting: Hematology & Oncology

## 2014-05-24 ENCOUNTER — Telehealth: Payer: Self-pay | Admitting: Hematology & Oncology

## 2014-05-24 NOTE — Telephone Encounter (Signed)
Mailed April schedule

## 2014-05-25 ENCOUNTER — Other Ambulatory Visit: Payer: Self-pay | Admitting: *Deleted

## 2014-05-25 ENCOUNTER — Encounter (HOSPITAL_COMMUNITY)
Admission: RE | Admit: 2014-05-25 | Discharge: 2014-05-25 | Disposition: A | Discharge status: Home / Self Care | Payer: Medicare Other | Source: Ambulatory Visit | Attending: Neurology | Admitting: Neurology

## 2014-05-25 DIAGNOSIS — G609 Hereditary and idiopathic neuropathy, unspecified: Secondary | ICD-10-CM | POA: Diagnosis not present

## 2014-05-25 DIAGNOSIS — G610 Guillain-Barre syndrome: Secondary | ICD-10-CM

## 2014-05-25 DIAGNOSIS — M6281 Muscle weakness (generalized): Secondary | ICD-10-CM | POA: Insufficient documentation

## 2014-05-25 MED ORDER — SODIUM CHLORIDE 0.9 % IV SOLN: 1000.0000 mg | INTRAVENOUS | Status: DC

## 2014-05-25 MED ORDER — SODIUM CHLORIDE 0.9 % IV SOLN
1000.0000 mg | Freq: Once | INTRAVENOUS | Status: AC
  Administered 2014-05-25: 1000 mg via INTRAVENOUS
  Filled 2014-05-25: qty 8

## 2014-05-25 MED ORDER — METHYLPREDNISOLONE SODIUM SUCC 1000 MG IJ SOLR: 1000.0000 mg | Freq: Once | INTRAMUSCULAR | Status: DC

## 2014-05-26 LAB — PROTEIN ELECTROPHORESIS, SERUM, WITH REFLEX
ALBUMIN ELP: 50.8 % — AB (ref 55.8–66.1)
ALPHA-1-GLOBULIN: 4.1 % (ref 2.9–4.9)
ALPHA-2-GLOBULIN: 11.2 % (ref 7.1–11.8)
Beta 2: 10.5 % — ABNORMAL HIGH (ref 3.2–6.5)
Beta Globulin: 7.2 % (ref 4.7–7.2)
GAMMA GLOBULIN: 16.2 % (ref 11.1–18.8)
Total Protein, Serum Electrophoresis: 7.5 g/dL (ref 6.0–8.3)

## 2014-05-26 LAB — KAPPA/LAMBDA LIGHT CHAINS
KAPPA FREE LGHT CHN: 3.63 mg/dL — AB (ref 0.33–1.94)
KAPPA LAMBDA RATIO: 1.11 (ref 0.26–1.65)
Lambda Free Lght Chn: 3.28 mg/dL — ABNORMAL HIGH (ref 0.57–2.63)

## 2014-05-26 LAB — IGG, IGA, IGM
IgA: 743 mg/dL — ABNORMAL HIGH (ref 68–379)
IgG (Immunoglobin G), Serum: 1290 mg/dL (ref 650–1600)
IgM, Serum: 140 mg/dL (ref 41–251)

## 2014-05-26 LAB — VASCULAR ENDOTHELIAL GROWTH FACTOR

## 2014-05-26 LAB — LACTATE DEHYDROGENASE: LDH: 378 U/L — ABNORMAL HIGH (ref 94–250)

## 2014-05-26 LAB — IFE INTERPRETATION

## 2014-06-02 ENCOUNTER — Encounter: Payer: Self-pay | Admitting: Internal Medicine

## 2014-06-02 ENCOUNTER — Ambulatory Visit (INDEPENDENT_AMBULATORY_CARE_PROVIDER_SITE_OTHER): Payer: Medicare Other | Admitting: Internal Medicine

## 2014-06-02 VITALS — BP 160/90 | HR 96 | Temp 98.0°F | Resp 20 | Ht 70.0 in | Wt 194.0 lb

## 2014-06-02 DIAGNOSIS — Z86010 Personal history of colonic polyps: Secondary | ICD-10-CM | POA: Diagnosis not present

## 2014-06-02 DIAGNOSIS — K625 Hemorrhage of anus and rectum: Secondary | ICD-10-CM

## 2014-06-02 LAB — CBC WITH DIFFERENTIAL/PLATELET
Basophils Absolute: 0 10*3/uL (ref 0.0–0.1)
Basophils Relative: 0.1 % (ref 0.0–3.0)
EOS PCT: 1.5 % (ref 0.0–5.0)
Eosinophils Absolute: 0.1 10*3/uL (ref 0.0–0.7)
HEMATOCRIT: 38.7 % — AB (ref 39.0–52.0)
HEMOGLOBIN: 13 g/dL (ref 13.0–17.0)
LYMPHS PCT: 28.5 % (ref 12.0–46.0)
Lymphs Abs: 1.4 10*3/uL (ref 0.7–4.0)
MCHC: 33.5 g/dL (ref 30.0–36.0)
MCV: 87 fl (ref 78.0–100.0)
Monocytes Absolute: 1 10*3/uL (ref 0.1–1.0)
Monocytes Relative: 19.9 % — ABNORMAL HIGH (ref 3.0–12.0)
Neutro Abs: 2.4 10*3/uL (ref 1.4–7.7)
Neutrophils Relative %: 50 % (ref 43.0–77.0)
Platelets: 166 10*3/uL (ref 150.0–400.0)
RBC: 4.45 Mil/uL (ref 4.22–5.81)
RDW: 14.4 % (ref 11.5–15.5)
WBC: 4.8 10*3/uL (ref 4.0–10.5)

## 2014-06-02 MED ORDER — HYDROCORTISONE 2.5 % RE CREA: 1.0000 "application " | TOPICAL_CREAM | Freq: Two times a day (BID) | RECTAL | Status: DC

## 2014-06-02 NOTE — Patient Instructions (Signed)
General surgery evaluation as discussed  Use hemorrhoidal cream as directed

## 2014-06-02 NOTE — Progress Notes (Signed)
Subjective:    Patient ID: Jonathon [name], male    DOB: 03-Oct-1935, 79 y.o.   MRN: [id number]  HPI 79 year old patient who has a history of colonic polyps.  He had follow-up colonoscopy one year ago and revealed no polyps or significant diverticular disease For the past 2 weeks he has had bright red rectal bleeding associated with each bowel movement. He has a long history of hemorrhoids and the remote past had some ablation procedure performed.  He has had no pain and has not been using any rectal creams  Past Medical History  Diagnosis Date  . BENIGN PROSTATIC HYPERTROPHY, HX OF 10/25/2008  . BRADYCARDIA 10/25/2008  . HYPERTENSION 11/21/2006  . 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 PACEMAKER, PERMANENT 10/25/2008  . TOBACCO ABUSE 10/25/2008    Quit 2012  . Iron deficiency anemia, unspecified 04/19/2013  . Asthma     History   Social History  . Marital Status: Divorced    Spouse Name: N/A    Number of Children: 23  . Years of Education: N/A   Occupational History  . ENVIROMENTAL SERVICE    Social History Main Topics  . Smoking status: Former Smoker -- 0.25 packs/day for 46 years    Types: Cigarettes    Start date: 03/16/1965    Quit date: 06/15/2010  . Smokeless tobacco: Never Used     Comment: quit 3 years ago  . Alcohol Use: 0.0 oz/week     Comment: 4 beers per day  . Drug Use: No  . Sexual Activity: Not on file   Other Topics Concern  . 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    Past Surgical History  Procedure Laterality Date  . Pacemaker insertion  2005  . Colonoscopy  [id number]  . Skin graft Right 1962    wrist  . Permanent pacemaker generator change N/A 06/19/2012    Procedure: PERMANENT PACEMAKER GENERATOR CHANGE;  Surgeon: [name], MD;  Location: [facility] CATH LAB;  Service: Cardiovascular;  Laterality: N/A;    Family History  Problem  Relation Age of Onset  . Breast cancer Sister     Living, 80  . Diabetes type II Brother     Living, 53  . Breast cancer Sister     Living, 62  . Heart attack Father     Died, 36  . Kidney disease Father   . Parkinson's disease Mother     Died, 41  . Diabetes Daughter     Living, 77  . Diabetes Son     Living, 51  . Colon cancer Neg Hx   . Rectal cancer Neg Hx   . Stomach cancer Neg Hx   . Ovarian cancer Daughter     No Known Allergies  Current Outpatient Prescriptions on File Prior to Visit  Medication Sig Dispense Refill  . cholecalciferol (VITAMIN D) 400 UNITS TABS tablet Take 400 Units by mouth daily.     . Cyanocobalamin (VITAMIN B 12 PO) Take 1,000 mg by mouth daily.    Marland Kitchen diltiazem (CARDIZEM LA) 240 MG 24 hr tablet Take 1 tablet (240 mg total) by mouth daily. 90 tablet 3  . Garlic 025 MG CAPS Take 1,000 mg by mouth every morning.     . Multiple Vitamins-Minerals (CENTRUM ADULTS PO) Take by mouth.    . Niacin (VITAMIN B-3 PO)  Take by mouth every morning.    . Omega-3 Fatty Acids (FISH OIL) 1000 MG CAPS Take by mouth every morning.    . tamsulosin (FLOMAX) 0.4 MG CAPS capsule TAKE 1 CAPSULE BY MOUTH DAILY 90 capsule 1  . testosterone (ANDROGEL) 50 MG/5GM (1%) GEL APPLY 1 PACKET ONTO THE SKIN ONCE DAILY AS DIRECTED 150 g 0   Current Facility-Administered Medications on File Prior to Visit  Medication Dose Route Frequency Provider Last Rate Last Dose  . methylPREDNISolone sodium succinate (SOLU-MEDROL) injection 1,000 mg  1,000 mg Intravenous Once Donika K Patel, DO      . methylPREDNISolone sodium succinate (SOLU-MEDROL) injection 1,000 mg  1,000 mg Intravenous Once Donika K Patel, DO      . methylPREDNISolone sodium succinate (SOLU-MEDROL) injection 1,000 mg  1,000 mg Intravenous Once Donika K Patel, DO        BP 160/90 mmHg  Pulse 96  Temp(Src) 98 F (36.7 C) (Oral)  Resp 20  Ht 5\' 10"  (1.778 m)  Wt 194 lb (87.998 kg)  BMI 27.84 kg/m2  SpO2  99%       Review of Systems  Constitutional: Negative for fever, chills, appetite change and fatigue.  HENT: Negative for congestion, dental problem, ear pain, hearing loss, sore throat, tinnitus, trouble swallowing and voice change.   Eyes: Negative for pain, discharge and visual disturbance.  Respiratory: Negative for cough, chest tightness, wheezing and stridor.   Cardiovascular: Negative for chest pain, palpitations and leg swelling.  Gastrointestinal: Positive for blood in stool. Negative for nausea, vomiting, abdominal pain, diarrhea, constipation and abdominal distention.  Genitourinary: Negative for urgency, hematuria, flank pain, discharge, difficulty urinating and genital sores.  Musculoskeletal: Negative for myalgias, back pain, joint swelling, arthralgias, gait problem and neck stiffness.  Skin: Negative for rash.  Neurological: Negative for dizziness, syncope, speech difficulty, weakness, numbness and headaches.  Hematological: Negative for adenopathy. Does not bruise/bleed easily.  Psychiatric/Behavioral: Negative for behavioral problems and dysphoric mood. The patient is not nervous/anxious.        Objective:   Physical Exam  Constitutional: He appears well-developed and well-nourished. No distress.  Abdominal: Bowel sounds are normal. He exhibits no distension. There is no tenderness.  Genitourinary:  Prominent perianal skin folds with multiple polypoid lesions          Assessment & Plan:   Persistent bright red rectal bleeding secondary to perianal pathology.  Patient has some external hemorrhoids.  Prominent perirectal skin folds and polypoid lesions.  Will refer to general surgery for consideration of further treatment.  Will treat with Anusol HC cream

## 2014-06-02 NOTE — Progress Notes (Signed)
Pre visit review using our clinic review tool, if applicable. No additional management support is needed unless otherwise documented below in the visit note. 

## 2014-06-14 ENCOUNTER — Other Ambulatory Visit (INDEPENDENT_AMBULATORY_CARE_PROVIDER_SITE_OTHER): Payer: Self-pay | Admitting: Surgery

## 2014-06-14 DIAGNOSIS — K643 Fourth degree hemorrhoids: Secondary | ICD-10-CM | POA: Diagnosis not present

## 2014-06-14 NOTE — H&P (Signed)
Jonathon [name] 06/14/2014 9:52 AM Location: [facility] Patient #: [id number] DOB: 1936/04/23 Divorced / Language: Jonathon [name] / Race: Black or African American Male History of Present Illness Jonathon Hector MD; 06/14/2014 10:13 AM) The patient is a 79 year old male who presents with hemorrhoids. Patient sent by primary care physician over concern of worsening hemorrhoids with bleeding. Pleasant male with intermittent rectal bleeding in the past. Known hemorrhoids. He thinks he was going to get laser other treatment any decades ago when he lived in [place]. Not since. He tends to have about 5 small but mostly well-formed bowel movements a day. Usually after every meal. Does get intermittent bouts of rectal bleeding. Had more recent episode. Primary care physician concerned. Surgical consultation recommended. Patient notes that the bleeding has fade away over the past week or so. Denies pain with defecation. No drainage. No incontinence. Mild irritation but not severe. History of Crohn's or ulcerative colitis. No history of inflammatory bowel disease. No history of irritable bowel syndrome. Had colonoscopy last year which showed the hyperplastic polyp and hemorrhoids but no other major problems. Other Problems Jonathon Mage Spill[place], [name]; 06/14/2014 9:53 AM) Asthma Enlarged Prostate Hemorrhoids High blood pressure  Past Surgical History Jonathon Mage [place], [name]; 06/14/2014 9:53 AM) Colon Polyp Removal - Colonoscopy Hemorrhoidectomy  Diagnostic Studies History Jonathon Mage Spillers, [name]; 06/14/2014 9:53 AM) Colonoscopy within last year  Allergies Jonathon Mage Spillers, [name]; 06/14/2014 9:54 AM) No Known Drug Allergies 06/14/2014  Medication History (Jonathon Spillers, [name]; 06/14/2014 9:58 AM) Testosterone (50 MG/5GM(1%) Gel, Transdermal) Active. Vitamin D (400UNIT Tablet, Oral) Active. Vitamin B12 (100MCG Tablet, Oral) Active. Cardizem LA (240MG  Tablet ER 24HR, Oral) Active. Garlic (300MG   Tablet, Oral) Active. Anusol-HC (2.5% Cream, External) Active. Anusol-HC (2.5% Cream, Rectal) Active. Centrum Silver Ultra Mens (Oral) Active. Niacin (250MG  Tablet, Oral) Active. Fish Oil (1000MG  Capsule, Oral) Active. Flomax (0.4MG  Capsule, Oral) Active. Medications Reconciled  Social History Jonathon [name], [place]; 06/14/2014 9:53 AM) Alcohol use Moderate alcohol use. Caffeine use Coffee. Illicit drug use Recently quit drug use. Tobacco use Former smoker.  Family History Jonathon [name], [place]; 06/14/2014 9:53 AM) Diabetes Mellitus Brother, Family Members In Jonathon [name], Jonathon [name].     Review of Systems Jonathon Mage [place] [name]; 06/14/2014 9:53 AM) General Present- Fatigue. Not Present- Appetite Loss, Chills, Fever, Night Sweats, Weight Gain and Weight Loss. Skin Present- Non-Healing Wounds. Not Present- Change in Wart/Mole, Dryness, Hives, Jaundice, New Lesions, Rash and Ulcer. HEENT Present- Yellow Eyes. Not Present- Earache, Hearing Loss, Hoarseness, Nose Bleed, Oral Ulcers, Ringing in the Ears, Seasonal Allergies, Sinus Pain, Sore Throat, Visual Disturbances and Wears glasses/contact lenses. Breast Present- Nipple Discharge. Not Present- Breast Mass, Breast Pain and Skin Changes. Gastrointestinal Present- Excessive gas. Not Present- Abdominal Pain, Bloating, Bloody Stool, Change in Bowel Habits, Chronic diarrhea, Constipation, Difficulty Swallowing, Gets full quickly at meals, Hemorrhoids, Indigestion, Nausea, Rectal Pain and Vomiting. Musculoskeletal Present- Muscle Weakness. Not Present- Back Pain, Joint Pain, Joint Stiffness, Muscle Pain and Swelling of Extremities. Neurological Present- Trouble walking and Weakness. Not Present- Decreased Memory, Fainting, Headaches, Numbness, Seizures, Tingling and Tremor. Endocrine Present- Hair Changes. Not Present- Cold Intolerance, Excessive Hunger, Heat Intolerance, Hot flashes and New Diabetes.  Vitals (Jonathon [name]; 06/14/2014 9:54  AM) 06/14/2014 9:53 AM Weight: 186 lb Height: 70in Body Surface Area: 2.04 m Body Mass Index: 26.69 kg/m Pulse: 92 (Regular)  BP: 164/94 (Sitting, Left Arm, Standard)     Physical Exam Jonathon Hector MD; 06/14/2014 10:18 AM)  General Mental Status-Alert. General Appearance-Not in acute distress,  Not Sickly. Orientation-Oriented X3. Hydration-Well hydrated. Voice-Normal.  Integumentary Global Assessment Upon inspection and palpation of skin surfaces of the - Axillae: non-tender, no inflammation or ulceration, no drainage. and Distribution of scalp and body hair is normal. General Characteristics Temperature - normal warmth is noted.  Head and Neck Head-normocephalic, atraumatic with no lesions or palpable masses. Face Global Assessment - atraumatic, no absence of expression. Neck Global Assessment - no abnormal movements, no bruit auscultated on the right, no bruit auscultated on the left, no decreased range of motion, non-tender. Trachea-midline. Thyroid Gland Characteristics - non-tender.  Eye Eyeball - Left-Extraocular movements intact, No Nystagmus. Eyeball - Right-Extraocular movements intact, No Nystagmus. Cornea - Left-No Hazy. Cornea - Right-No Hazy. Sclera/Conjunctiva - Left-No scleral icterus, No Discharge. Sclera/Conjunctiva - Right-No scleral icterus, No Discharge. Pupil - Left-Direct reaction to light normal. Pupil - Right-Direct reaction to light normal.  ENMT Ears Pinna - Left - no drainage observed, no generalized tenderness observed. Right - no drainage observed, no generalized tenderness observed. Nose and Sinuses External Inspection of the Nose - no destructive lesion observed. Inspection of the nares - Left - quiet respiration. Right - quiet respiration. Mouth and Throat Lips - Upper Lip - no fissures observed, no pallor noted. Lower Lip - no fissures observed, no pallor noted. Nasopharynx - no discharge  present. Oral Cavity/Oropharynx - Tongue - no dryness observed. Oral Mucosa - no cyanosis observed. Hypopharynx - no evidence of airway distress observed.  Chest and Lung Exam Inspection Movements - Normal and Symmetrical. Accessory muscles - No use of accessory muscles in breathing. Palpation Palpation of the chest reveals - Non-tender. Auscultation Breath sounds - Normal and Clear.  Cardiovascular Auscultation Rhythm - Regular. Murmurs & Other Heart Sounds - Auscultation of the heart reveals - No Murmurs and No Systolic Clicks.  Abdomen Inspection Inspection of the abdomen reveals - No Visible peristalsis and No Abnormal pulsations. Umbilicus - No Bleeding, No Urine drainage. Palpation/Percussion Palpation and Percussion of the abdomen reveal - Soft, Non Tender, No Rebound tenderness, No Rigidity (guarding) and No Cutaneous hyperesthesia.  Male Genitourinary Sexual Maturity Tanner 5 - Adult hair pattern and Adult penile size and shape. Note: Sternal male genitalia. No internal hernias. No condyloma. No testicular masses   Rectal Note: Giant right anterior prolapsed internal hemorrhoid. Numerous small polyps on it as well. No definite condyloma. Some irritation but no active bleeding. Mild scar posterior midline but no active fissure. No stricture. Normal sphincter tone. Large left lateral greater than rope right posterior internal hemorrhoids. Left lateral partially prolapses. Prostate smooth without enlargement. No condylomata. No fistula.   Peripheral Vascular Upper Extremity Inspection - Left - No Cyanotic nailbeds, Not Ischemic. Right - No Cyanotic nailbeds, Not Ischemic.  Neurologic Neurologic evaluation reveals -normal attention span and ability to concentrate, able to name objects and repeat phrases. Appropriate fund of knowledge , normal sensation and normal coordination. Mental Status Affect - not angry, not paranoid. Cranial Nerves-Normal  Bilaterally. Gait-Normal.  Neuropsychiatric Mental status exam performed with findings of-able to articulate well with normal speech/language, rate, volume and coherence, thought content normal with ability to perform basic computations and apply abstract reasoning and no evidence of hallucinations, delusions, obsessions or homicidal/suicidal ideation.  Musculoskeletal Global Assessment Spine, Ribs and Pelvis - no instability, subluxation or laxity. Right Upper Extremity - no instability, subluxation or laxity.  Lymphatic Head & Neck  General Head & Neck Lymphatics: Bilateral - Description - No Localized lymphadenopathy. Axillary  General Axillary Region: Bilateral - Description -  No Localized lymphadenopathy. Femoral & Inguinal  Generalized Femoral & Inguinal Lymphatics: Left - Description - No Localized lymphadenopathy. Right - Description - No Localized lymphadenopathy.   Results Jonathon Hector MD; 06/14/2014 10:18 AM) Procedures  Name Value Date ANOSCOPY, DIAGNOSTIC [phone]) [ Hemorrhoids ] Procedure Anal exam: External Hemorrhoid prolapse Internal exam: Internal Hemorroids ( non-bleeding) Other: Large hemorrhoids. Please see physical exam note.  Performed: 06/14/2014 10:18 AM    Assessment & Plan Jonathon Hector MD; 06/14/2014 10:18 AM)  COMPLEX FOURTH DEGREE HEMORRHOID (455.8  K64.3) Impression: Very large right anterior and lateral hemorrhoid pile chronically prolapsed with numerous smaller polyps on it. Recurrent bleeding.  I think he would benefit from surgery to get this area removed and THD hemorrhoidal ligation of his other piles since the left lateral pile is rather enlarged and the left lateral is stage III as well.  Because he is not horrifically symptomatic now, I did strongly recommend surgery. I did caution him that there is no other way to fix the problem and usually these things worsen over time. Seems larger than his colonoscopy from  last year. Because he's had recurrent problems with his hemorrhoids, he wishes to go and proceed with surgery. We will coordinate a convenient time. I did caution him he will have pain and soreness especially the first few weeks but eventually it will get better and the anatomy will be much improved.  The anatomy & physiology of the anorectal region was discussed.  The pathophysiology of hemorrhoids and differential diagnosis was discussed.  Natural history risks without surgery was discussed.   I stressed the importance of a bowel regimen to have daily soft bowel movements to minimize progression of disease.  Interventions such as sclerotherapy & banding were discussed.  The patient's symptoms are not adequately controlled by medicines and other non-operative treatments.  I feel the risks & problems of no surgery outweigh the operative risks; therefore, I recommended surgery to treat the hemorrhoids by ligation, pexy, and possible resection.  Risks such as bleeding, infection, urinary difficulties, need for further treatment, heart attack, death, and other risks were discussed.   I noted a good likelihood this will help address the problem.  Goals of post-operative recovery were discussed as well.  Possibility that this will not correct all symptoms was explained.  Post-operative pain, bleeding, constipation, and other problems after surgery were discussed.  We will work to minimize complications.   Educational handouts further explaining the pathology, treatment options, and bowel regimen were given as well.  Questions were answered.  The patient expresses understanding & wishes to proceed with surgery.     Current Plans Schedule for Surgery Pt Education - [facility] Hemorrhoids (Korissa Horsford) Pt Education - [facility] Rectal Surgery HCI (Mylan Lengyel): discussed with patient and provided information. Pt Education - [facility] Good Bowel Health (Eleora Sutherland) Pt Education - [facility] Pain Control (Lalonnie Shaffer) ANOSCOPY, DIAGNOSTIC ([id number])  Jonathon [name], M.D., F.A.C.S. Gastrointestinal and Minimally Invasive Surgery [facility] [address] [place], [place] [address] [phone] Main / Paging

## 2014-06-15 ENCOUNTER — Telehealth: Payer: Self-pay | Admitting: Internal Medicine

## 2014-06-15 NOTE — Telephone Encounter (Signed)
Received request from Nurse fax box, documents faxed for surgical clearance. To: [facility] Fax number: [phone] Attention: 2.3.16/km

## 2014-06-17 ENCOUNTER — Other Ambulatory Visit: Payer: Self-pay | Admitting: Family

## 2014-06-22 ENCOUNTER — Encounter (HOSPITAL_COMMUNITY)
Admission: RE | Admit: 2014-06-22 | Discharge: 2014-06-22 | Disposition: A | Discharge status: Home / Self Care | Payer: Medicare Other | Source: Ambulatory Visit | Attending: Neurology | Admitting: Neurology

## 2014-06-22 DIAGNOSIS — M6281 Muscle weakness (generalized): Secondary | ICD-10-CM | POA: Insufficient documentation

## 2014-06-22 DIAGNOSIS — G609 Hereditary and idiopathic neuropathy, unspecified: Secondary | ICD-10-CM | POA: Insufficient documentation

## 2014-06-22 MED ORDER — METHYLPREDNISOLONE SODIUM SUCC 1000 MG IJ SOLR
1000.0000 mg | INTRAMUSCULAR | Status: DC
  Administered 2014-06-22: 1000 mg via INTRAVENOUS
  Filled 2014-06-22: qty 8

## 2014-06-22 NOTE — Progress Notes (Signed)
[place] for surgery. Low risk. GT

## 2014-06-23 NOTE — Progress Notes (Signed)
Thanks

## 2014-06-27 ENCOUNTER — Ambulatory Visit (INDEPENDENT_AMBULATORY_CARE_PROVIDER_SITE_OTHER): Payer: Medicare Other | Admitting: *Deleted

## 2014-06-27 ENCOUNTER — Encounter: Payer: Self-pay | Admitting: Internal Medicine

## 2014-06-27 DIAGNOSIS — I495 Sick sinus syndrome: Secondary | ICD-10-CM | POA: Diagnosis not present

## 2014-06-27 LAB — MDC_IDC_ENUM_SESS_TYPE_REMOTE
Battery Remaining Longevity: 145 mo
Brady Statistic AP VP Percent: 0 %
Brady Statistic AS VS Percent: 99 %
Lead Channel Impedance Value: 488 Ohm
Lead Channel Sensing Intrinsic Amplitude: 2.8 mV
Lead Channel Sensing Intrinsic Amplitude: 5.6 mV
Lead Channel Setting Pacing Amplitude: 2 V
Lead Channel Setting Pacing Pulse Width: 0.4 ms
MDC IDC MSMT BATTERY IMPEDANCE: 158 Ohm
MDC IDC MSMT BATTERY VOLTAGE: 2.78 V
MDC IDC MSMT LEADCHNL RA PACING THRESHOLD AMPLITUDE: 0.875 V
MDC IDC MSMT LEADCHNL RA PACING THRESHOLD PULSEWIDTH: 0.4 ms
MDC IDC MSMT LEADCHNL RV IMPEDANCE VALUE: 682 Ohm
MDC IDC MSMT LEADCHNL RV PACING THRESHOLD AMPLITUDE: 0.625 V
MDC IDC MSMT LEADCHNL RV PACING THRESHOLD PULSEWIDTH: 0.4 ms
MDC IDC SESS DTM: 20160215182917
MDC IDC SET LEADCHNL RV PACING AMPLITUDE: 2.5 V
MDC IDC SET LEADCHNL RV SENSING SENSITIVITY: 2.8 mV
MDC IDC STAT BRADY AP VS PERCENT: 0 %
MDC IDC STAT BRADY AS VP PERCENT: 0 %

## 2014-06-27 NOTE — Progress Notes (Signed)
Remote pacemaker transmission.   

## 2014-07-08 ENCOUNTER — Telehealth: Payer: Self-pay | Admitting: *Deleted

## 2014-07-08 NOTE — Telephone Encounter (Signed)
Informed patient that GT wants to bring him into the ofc to discuss A/C due to AF. Patient voiced understanding.

## 2014-07-20 ENCOUNTER — Encounter (HOSPITAL_COMMUNITY)
Admission: RE | Admit: 2014-07-20 | Discharge: 2014-07-20 | Disposition: A | Discharge status: Home / Self Care | Payer: Medicare Other | Source: Ambulatory Visit | Attending: Neurology | Admitting: Neurology

## 2014-07-20 DIAGNOSIS — G609 Hereditary and idiopathic neuropathy, unspecified: Secondary | ICD-10-CM | POA: Insufficient documentation

## 2014-07-20 DIAGNOSIS — M6281 Muscle weakness (generalized): Secondary | ICD-10-CM | POA: Diagnosis not present

## 2014-07-20 MED ORDER — METHYLPREDNISOLONE SODIUM SUCC 1000 MG IJ SOLR
1000.0000 mg | INTRAMUSCULAR | Status: DC
  Administered 2014-07-20: 1000 mg via INTRAVENOUS
  Filled 2014-07-20: qty 8

## 2014-07-22 ENCOUNTER — Encounter: Payer: Self-pay | Admitting: Neurology

## 2014-07-22 ENCOUNTER — Ambulatory Visit (INDEPENDENT_AMBULATORY_CARE_PROVIDER_SITE_OTHER): Payer: Medicare Other | Admitting: Neurology

## 2014-07-22 VITALS — BP 160/100 | HR 79 | Ht 70.0 in | Wt 187.2 lb

## 2014-07-22 DIAGNOSIS — M6281 Muscle weakness (generalized): Secondary | ICD-10-CM | POA: Diagnosis not present

## 2014-07-22 DIAGNOSIS — G6181 Chronic inflammatory demyelinating polyneuritis: Secondary | ICD-10-CM | POA: Diagnosis not present

## 2014-07-22 DIAGNOSIS — G609 Hereditary and idiopathic neuropathy, unspecified: Secondary | ICD-10-CM | POA: Diagnosis not present

## 2014-07-22 DIAGNOSIS — R29898 Other symptoms and signs involving the musculoskeletal system: Secondary | ICD-10-CM

## 2014-07-22 MED ORDER — IMMUNE GLOBULIN (HUMAN) 10 GM/100ML IV SOLN: 1.0000 g/kg | INTRAVENOUS | Status: DC

## 2014-07-22 MED ORDER — METHYLPREDNISOLONE SODIUM SUCC 1000 MG IJ SOLR: 1000.0000 mg | Freq: Once | INTRAMUSCULAR | Status: DC

## 2014-07-22 MED ORDER — IMMUNE GLOBULIN (HUMAN) 10 GM/100ML IV SOLN: 400.0000 mg/kg | INTRAVENOUS | Status: DC

## 2014-07-22 NOTE — Patient Instructions (Addendum)
Check CK, vitamin B12, TSH We will schedule monthly steroids again We will also look into the cost of IVIG  Return to clinic in three months

## 2014-07-22 NOTE — Progress Notes (Signed)
[facility]  Follow-up Visit   Date: 07/22/2014   CONNELLY NETTERVILLE MRN: [id number] DOB: 08/18/1935   Interim History: Jonathon [name] is a 79 y.o. 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  He is here with his daughter, [name] today.  History of present illness: Patient was in his usual state of health until 2013. He has always been physical active and would go to the gym daily (walking up 10 miles/day). During the spring of 2013, he developed relatively subacute onset of left > right hamstring soreness without an inciting event. Pain initially started in the butt area and radiated down. There is associated proximal leg weakness and loss of muscle mass. Functionally, he is still able to complete all his usual activities, but has became much more cautious when climbing stairs.   - Follow-up 02/01/2013:  He continues to have weakness of his proximal legs, worse on the left. EMG did not show any myopathy or radiculopathy of the left lower extremity.  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  CK, aldolase, B12 was within normal limits. SPEP/UPEP shows monoclonal IgA kappa gammopathy and he underwent extensive hematologic testing, including bone marrow biopsy by Dr. Marin Olp which has been nondiagnostic.    - Follow-up 08/10/2013: Progressive loss of muscle bulk over the upper arms and proximal legs. He has difficulty with climbing stairs and opening jars, and dyspnea on exertion. He feels unsteady at times, denies any falls. 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   He reports previously drinking 4 beers/day and on the weekends up to 6 beers/day.   He also reports having 15lb unintentional weight loss and has been eating little.    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  CK is normal, aldolase elevated, and GM1 antibody is negative. There is evidence of severe multilevel spinal stenosis of the cervical spine, and milder findings in the lumbar region.  Clinically, his remains stable without any significant worsening.    - Follow-up 10/12/2013:  CSF studies performed shows increase oligoclonal bands with normal cell count, IgG index, and myelin basic proteins.   - Follow-up 11/09/2013:  He completed 3-days of solumedrol (6/8/-6/10) and since starting steroids, his muscle soreness has significantly improved.   His appetite is better and he gained 5lb over the past month.  Mood remains good.  He is still drinking 2 beers/nightly and 6 beers/d on the weekends.  Anti-MAG returned negative.  - Follow-up 02/09/2014:  He continues to get monthly steroid infusions and has been tolerating it well. There has been no worsening or notable change in muscle strength that he reports, although he has been going to the gym and pushing 85 pounds with leg exercises. He has no interval falls.      - Follow-up 05/16/2014:  He completed 51-month of monthly IVMP and reports little benefit over the past few months, but no worsening.  He continues to have leg weakness and early fatigue.  He does not feel that his hands or legs are weaker and has been able to increase the weight on his biceps curls [facility]  He also quit drinking his 6-pack of beer last week.  He endorses jittery feeling at times and imbalance.  He continues to gain weight and is now back to his baseline weight (198lb).  UPDATE 07/22/2014:  Over the past month, he noticed increased weakness of his legs and has lost 11lb.  Appetite remains poor.  Last week at work, he work broke out in a sweat, became light headed, and when he stepped off the machine, he fell.  There was no loss of consciousness and he was  able to get up by himself, but since then he has decided to stop working now.  He is no longer weight lifting at the gym, but does enjoy using the sauna at the [facility].  He continues to have problems with endurance and easily fatigue with walking.   Medications:  Current Outpatient Prescriptions on File Prior to Visit  Medication Sig Dispense Refill  . cholecalciferol (VITAMIN D) 400 UNITS TABS tablet Take 400 Units by mouth daily.     . Cyanocobalamin (VITAMIN B 12 PO) Take 1,000 mg by mouth daily.    Marland Kitchen diltiazem (CARDIZEM LA) 240 MG 24 hr tablet Take 1 tablet (240 mg total) by mouth daily. 90 tablet 3  . Garlic 009 MG CAPS Take 1,000 mg by mouth every morning.     . hydrocortisone (ANUSOL-HC) 2.5 % rectal cream Place 1 application rectally 2 (two) times daily. 30 g 0  . Multiple Vitamins-Minerals (CENTRUM ADULTS PO) Take by mouth.    . Niacin (VITAMIN B-3 PO) Take by mouth every morning.    . Omega-3 Fatty Acids (FISH OIL) 1000 MG CAPS Take by mouth every morning.    . tamsulosin (FLOMAX) 0.4 MG CAPS capsule TAKE 1 CAPSULE BY MOUTH DAILY 90 capsule 1  . testosterone (ANDROGEL) 50 MG/5GM (1%) GEL APPLY 1 PACKET ONTO THE SKIN ONCE DAILY AS DIRECTED 150 g 0   Current Facility-Administered Medications on File Prior to Visit  Medication Dose Route Frequency Provider Last Rate Last Dose  . methylPREDNISolone sodium succinate (SOLU-MEDROL) injection 1,000 mg  1,000 mg Intravenous Once Donika K Patel, DO      . methylPREDNISolone sodium succinate (SOLU-MEDROL) injection 1,000 mg  1,000 mg Intravenous Once Donika K Patel, DO      . methylPREDNISolone sodium succinate (SOLU-MEDROL) injection 1,000 mg  1,000 mg Intravenous Once Donika K Patel, DO        Allergies: No Known Allergies   Review of Systems:  CONSTITUTIONAL: No fevers, chills, night sweats EYES: No visual changes or eye pain ENT: No hearing changes.  No history of nose bleeds.   RESPIRATORY: No cough, wheezing and shortness of breath.    CARDIOVASCULAR: Negative for chest pain, and palpitations.   GI: Negative for abdominal discomfort, blood in stools or black stools.  No recent change in bowel habits.   GU:  No history of incontinence.   MUSCLOSKELETAL: No history of joint pain or swelling.  No myalgias.   SKIN: Negative for lesions, rash, and itching.   HEMATOLOGY/ONCOLOGY: Negative for prolonged bleeding, bruising easily, and swollen nodes.  ENDOCRINE: Negative for cold or heat intolerance, polydipsia or goiter.   PSYCH:  +depression or anxiety symptoms.   NEURO: As Above.   Vital Signs:  BP 160/100 mmHg  Pulse 79  Ht '5\' 10"'  (1.778 m)  Wt 187 lb 3 oz (84.908 kg)  BMI 26.86 kg/m2  SpO2 97%  Neurological Exam: MENTAL STATUS including orientation to time, place, person, recent and remote memory, attention span and concentration, language, and fund of knowledge is normal.  Speech is not dysarthric.  CRANIAL  NERVES:  Pupils are round and reactive to light.  Normal conjugate, extra-ocular eye movements in all directions of gaze. No ptosis.  Face is symmetric.  Palate elevates symmetrically.  Normal shoulder shrug and head rotation.  Normal tongue strength and range of motion, no deviation or fasciculation.  MOTOR:  Bilateral moderate ADM, FDI, ABP and moderate bilateral quadriceps atrophy.  No fasciculations.  Mild resting head tremor.  No pronator drift.  Neck flexion and extension is 5/5  Right Upper Extremity:    Left Upper Extremity:    Deltoid  5/5   Deltoid  5/5   Biceps  5/5   Biceps  5/5   Triceps  5/5   Triceps  5/5   Wrist extensors  5/5   Wrist extensors  5/5   Wrist flexors  5/5   Wrist flexors  5/5   Finger extensors  5/5   Finger extensors  5/5   Finger flexors  5/5   Finger flexors  5/5   Dorsal interossei  4/5   Dorsal interossei  4/5   Abductor pollicis  4+/5   Abductor pollicis * 5-/5   Tone (Ashworth scale)  0  Tone (Ashworth scale)  0   Right Lower Extremity:    Left Lower Extremity:    Hip  flexors  4+/5   Hip flexors  4+/5   Hip extensors  5/5   Hip extensors  5/5   Knee flexors  5/5   Knee flexors  5/5   Knee extensors  5/5   Knee extensors  5/5   Dorsiflexors  5/5   Dorsiflexors  5/5   Plantarflexors  5/5   Plantarflexors  5/5   Toe extensors  5/5   Toe extensors  5/5   Toe flexors  5/5   Toe flexors  5/5   Tone (Ashworth scale)  0  Tone (Ashworth scale)  0   MSRs:  Reflexes are 2+/4 in upper extremities, 2+ patella, and absent at Achilles.  Down-going plantar responses.  SENSORY: Vibration and temperature reduced distal to left ankle.  Pin prick intact throughout.    COORDINATION/GAIT:  Intact rapid alternating movements bilaterally.  Able to rise from a chair without using arms.  Gait narrow based and stable. Tandem and stressed gait intact.  He is able to perform squat and get up without difficulty  Data: Labs 01/13/2013:  CK 55, aldolase 7.9, TSH 0.39, copper 97, ceruloplasmin 30,  Labs 07/2013:  vitamin  B12 519, ANA neg, RF neg, ESR 5 Labs 08/10/2013:  CK 91, aldolase 14.9*, GM1 antibody - neg Labs 11/09/2013:  VEGF 118* (normal 31-86) Labs 06/02/2014:  VEGF <31  CSF 09/24/2013:  R1 W0 G59 Protein 42, IgG index 4.4, MBP < 2.0, WNV - neg, cytology - neg, OCB - 3 well defined bands present in CSF and serum, more prominent in CSF  EMG 01/25/2013:   1. 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  2. Left ulnar neuropathy at the elbow, moderately severe in degree electrically. A superimposed C8 motor radiculopathy cannot be excluded. 3. No evidence of diffuse motor axon loss or a generalized myopathy.  EMG 09/16/2013:  There is electrophysiological evidence of a chronic generalized sensorimotor polyradiculoneuropathy, axonal loss and demyelinating in type, affecting the left side. When compared to his previous EMG dated 01/25/2013, there is an interval worsening of  findings.  CT cervical spine and lumbar spine 08/17/2013: 1.  Advanced degenerative changes in the cervical spine, including multilevel bulky anterior endplate osteophytes.  2. Suspect multifactorial moderate to severe cervical spinal stenosis at the C5-C6 and C6-C7 levels. As the patient is not a  candidate for MRI, cervical spine CT myelogram would confirm.  3. Associated severe multifactorial neural foraminal stenosis at the right C4, right C5, right C6, bilateral C7, and bilateral C8 nerve levels.  4. Mild for age lumbar spine degenerative changes, chiefly facet arthropathy. Up to mild multifactorial spinal stenosis at L2-L3 and L3-L4.    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  His electrodiagnostic testing last performed in May 2015 showed progression of axon loss and demyelinating features affecting the left side, most suggestive of an acquired polyradiculoneuropathy.  He underwent CSF testing which showed normal cell count and protein. 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     Labs showed monoclonal IgA kappa gammopathy on serum for which he underwent extensive evaluation by Dr. Marin Olp, including bone marrow biopsy which was negative.  He also has transaminatis likely due to fatty liver.  The possibility of POEMS was raised given elevated VEGF, but repeat VEGF from 05/2014 returned normal.  Patient was started on solumedrol in June 2015 and has been getting monthly IVMP since then, but reports new worsening of leg weakness since the start of 2016.  His exam discloses proximal leg  (worse) and hand weakness (stable).  His persistent hand weakness persists and may also be due to superimposed cervical foraminal stenosis at this level. We discuss treatment options including switching to IVIG, which he seems interested in.  I will initiate prior authorization to start IVIG and determine the cost to patient.  Risks and benefits discussed at length.  Oral immunomodulatory therapies such as imuran or cellcept will take months to have effect and with his transaminitis, I would be wary of starting potential hepatotoxic agents.   PLAN: Check CK, vitamin B12, TSH Continue solumedrol 1g monthly for now, until we get a better idea of what IVIG would cost patient Start IVIG 76m/kg given over 5 days, followed by 1g/kg monthly.  Risks and benefits discussed, literature provided for patient to review.  Encouraged him to add ensure or boost to diet Return to clinic in 337-month    The duration of this appointment visit was 30 minutes of face-to-face time with the patient.  Greater than 50% of this time was spent in counseling, explanation of diagnosis, planning of further management, and coordination of care.   Thank you for allowing me to participate in patient's care.  If I can answer any additional questions, I would be pleased to do so.    Sincerely,    Donika K. PaPosey ProntoDO

## 2014-07-27 ENCOUNTER — Encounter: Payer: Self-pay | Admitting: Internal Medicine

## 2014-07-27 ENCOUNTER — Ambulatory Visit (INDEPENDENT_AMBULATORY_CARE_PROVIDER_SITE_OTHER): Payer: Medicare Other | Admitting: Internal Medicine

## 2014-07-27 VITALS — BP 136/80 | HR 221 | Ht 70.0 in | Wt 183.8 lb

## 2014-07-27 DIAGNOSIS — R001 Bradycardia, unspecified: Secondary | ICD-10-CM

## 2014-07-27 DIAGNOSIS — I471 Supraventricular tachycardia: Secondary | ICD-10-CM

## 2014-07-27 DIAGNOSIS — Z45018 Encounter for adjustment and management of other part of cardiac pacemaker: Secondary | ICD-10-CM | POA: Diagnosis not present

## 2014-07-27 DIAGNOSIS — Z950 Presence of cardiac pacemaker: Secondary | ICD-10-CM | POA: Diagnosis not present

## 2014-07-27 DIAGNOSIS — I10 Essential (primary) hypertension: Secondary | ICD-10-CM | POA: Diagnosis not present

## 2014-07-27 LAB — MDC_IDC_ENUM_SESS_TYPE_INCLINIC
Battery Impedance: 158 Ohm
Battery Remaining Longevity: 145 mo
Battery Voltage: 2.78 V
Brady Statistic AP VS Percent: 0 %
Brady Statistic AS VP Percent: 0 %
Date Time Interrogation Session: 20160316125958
Lead Channel Impedance Value: 644 Ohm
Lead Channel Setting Pacing Amplitude: 2 V
Lead Channel Setting Sensing Sensitivity: 2.8 mV
MDC IDC MSMT LEADCHNL RA IMPEDANCE VALUE: 461 Ohm
MDC IDC MSMT LEADCHNL RA SENSING INTR AMPL: 5.6 mV — AB
MDC IDC MSMT LEADCHNL RV SENSING INTR AMPL: 5.6 mV
MDC IDC SET LEADCHNL RV PACING AMPLITUDE: 2.5 V
MDC IDC SET LEADCHNL RV PACING PULSEWIDTH: 0.4 ms
MDC IDC STAT BRADY AP VP PERCENT: 0 %
MDC IDC STAT BRADY AS VS PERCENT: 99 %

## 2014-07-27 MED ORDER — FLECAINIDE ACETATE 150 MG PO TABS
75.0000 mg | ORAL_TABLET | Freq: Two times a day (BID) | ORAL | Status: DC
Start: 2014-07-27 — End: 2014-08-15

## 2014-07-27 NOTE — Assessment & Plan Note (Addendum)
In the office today, the patient was in sustained SVT at 220/min. Rapid pacing was carried out from the atrial lead through the PM. We were able to pace terminate his SVT which was atrial tachycardia by pacing the atrium at 260/min. He was in sinus tachy at 115/min. I discussed the treatment options and have recommended a trial of flecainide. Will consider catheter ablation.

## 2014-07-27 NOTE — Progress Notes (Signed)
HPI Jonathon [name] returns today for followup. He is a pleasant 79 yo man with a h/o symptomatic bradycardia, s/p PPM. He has had problems with weakness, muscle loss and fatigue and has been diagnosed with a chronic demyelinating disorder. He also notes palpitations which have increased in frequency and severity. PM remote interogation has demonstrated SVT at 230/min. No syncope.  He was working part time. He presents today feeling anxious and is found to be in SVT at 220/min.   No Known Allergies   Current Outpatient Prescriptions  Medication Sig Dispense Refill  . cholecalciferol (VITAMIN D) 400 UNITS TABS tablet Take 400 Units by mouth daily.     . Cyanocobalamin (VITAMIN B 12 PO) Take 1,000 mg by mouth daily.    Marland Kitchen diltiazem (CARDIZEM LA) 240 MG 24 hr tablet Take 1 tablet (240 mg total) by mouth daily. 90 tablet 3  . Garlic 102 MG CAPS Take 1,000 mg by mouth every morning.     . irbesartan (AVAPRO) 300 MG tablet   0  . Multiple Vitamins-Minerals (CENTRUM ADULTS PO) Take by mouth.    . Omega-3 Fatty Acids (FISH OIL) 1000 MG CAPS Take by mouth every morning.    . tamsulosin (FLOMAX) 0.4 MG CAPS capsule TAKE 1 CAPSULE BY MOUTH DAILY 90 capsule 1  . testosterone (ANDROGEL) 50 MG/5GM (1%) GEL APPLY 1 PACKET ONTO THE SKIN ONCE DAILY AS DIRECTED 150 g 0  . flecainide (TAMBOCOR) 150 MG tablet Take 0.5 tablets (75 mg total) by mouth 2 (two) times daily. 30 tablet 2   No current facility-administered medications for this visit.   Facility-Administered Medications Ordered in Other Visits  Medication Dose Route Frequency Provider Last Rate Last Dose  . methylPREDNISolone sodium succinate (SOLU-MEDROL) injection 1,000 mg  1,000 mg Intravenous Once Jonathon K Patel, DO      . methylPREDNISolone sodium succinate (SOLU-MEDROL) injection 1,000 mg  1,000 mg Intravenous Once Jonathon K Patel, DO      . methylPREDNISolone sodium succinate (SOLU-MEDROL) injection 1,000 mg  1,000 mg Intravenous Once [name], DO          Past Medical History  Diagnosis Date  . BENIGN PROSTATIC HYPERTROPHY, HX OF 10/25/2008  . BRADYCARDIA 10/25/2008  . HYPERTENSION 11/21/2006  . 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 PACEMAKER, PERMANENT 10/25/2008  . TOBACCO ABUSE 10/25/2008    Quit 2012  . Iron deficiency anemia, unspecified 04/19/2013  . Asthma     ROS:   All systems reviewed and negative except as noted in the HPI.   Past Surgical History  Procedure Laterality Date  . Pacemaker insertion  2005  . Colonoscopy  [id number]  . Skin graft Right 1962    wrist  . Permanent pacemaker generator change N/A 06/19/2012    Procedure: PERMANENT PACEMAKER GENERATOR CHANGE;  Surgeon: [name], MD;  Location[facility] CATH LAB;  Service: Cardiovascular;  Laterality: N/A;     Family History  Problem Relation Age of Onset  . Breast cancer Sister     Living, 19  . Diabetes type II Brother     Living, 62  . Breast cancer Sister     Living, 34  . Heart attack Father     Died, 65  . Kidney disease Father   . Parkinson's disease Mother     Died, 88  . Diabetes Daughter     Living, 31  . Diabetes Son     Living, 26  . Colon cancer  Neg Hx   . Rectal cancer Neg Hx   . Stomach cancer Neg Hx   . Ovarian cancer Daughter      History   Social History  . Marital Status: Divorced    Spouse Name: N/A  . Number of Children: 4  . Years of Education: N/A   Occupational History  . ENVIROMENTAL SERVICE    Social History Main Topics  . Smoking status: Former Smoker -- 0.25 packs/day for 46 years    Types: Cigarettes    Start date: 03/16/1965    Quit date: 06/15/2010  . Smokeless tobacco: Never Used     Comment: quit 3 years ago  . Alcohol Use: 0.0 oz/week    0 Standard drinks or equivalent per week     Comment: 4 beers per day  . Drug Use: No  . Sexual Activity: Not on file   Other Topics Concern  . Not on file   Social History Narrative   Has relocated from [place] in 2007. 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     BP 136/80 mmHg  Pulse 221  Ht 5\' 10"  (1.778 m)  Wt 183 lb 12.8 oz (83.371 kg)  BMI 26.37 kg/m2  Physical Exam:  anxious appearing 79 yo man, NAD HEENT: Unremarkable Neck:  8 cm JVD, no thyromegally Back:  No CVA tenderness Lungs:  scattered rales, well healed PPM incision, some increased work of breathing HEART:  Regular tachy rhythm, no murmurs, no rubs, no clicks, precordium is shaking Abd:  soft, positive bowel sounds, no organomegally, no rebound, no guarding Ext:  2 plus pulses, no edema, no cyanosis, no clubbing Skin:  No rashes no nodules Neuro:  CN II through XII intact, motor grossly intact  DEVICE  Normal device function.  See PaceArt for details.   Assess/Plan:

## 2014-07-27 NOTE — Assessment & Plan Note (Signed)
His medtronic DDD PM is working normally. Will recheck in several months. 

## 2014-07-27 NOTE — Patient Instructions (Signed)
Your physician has recommended you make the following change in your medication:  1) START Flecainide 150 mg twice daily -- take 1/2 tablet (75mg  total) twice daily  Your physician recommends that you schedule a nurse visit in: 2 weeks for EKG  Your physician recommends that you schedule a follow-up appointment in: 6 weeks with Dr. Lovena Le.

## 2014-07-27 NOTE — Assessment & Plan Note (Signed)
His blood pressure is stable. Will continue his current meds.

## 2014-08-08 ENCOUNTER — Encounter: Payer: Self-pay | Admitting: Internal Medicine

## 2014-08-08 ENCOUNTER — Ambulatory Visit (INDEPENDENT_AMBULATORY_CARE_PROVIDER_SITE_OTHER): Payer: Medicare Other | Admitting: Internal Medicine

## 2014-08-08 DIAGNOSIS — G610 Guillain-Barre syndrome: Secondary | ICD-10-CM | POA: Diagnosis not present

## 2014-08-08 DIAGNOSIS — G6181 Chronic inflammatory demyelinating polyneuritis: Secondary | ICD-10-CM | POA: Diagnosis not present

## 2014-08-08 DIAGNOSIS — Z23 Encounter for immunization: Secondary | ICD-10-CM | POA: Diagnosis not present

## 2014-08-08 DIAGNOSIS — I10 Essential (primary) hypertension: Secondary | ICD-10-CM

## 2014-08-08 NOTE — Progress Notes (Signed)
Subjective:    Patient ID: Jonathon [name], male    DOB: Aug 30, 1935, 79 y.o.   MRN: [id number]  HPI  79 year old patient who is followed closely by neurology and cardiology.  Seen today for his six-month follow-up.  He has essential hypertension. His only complaint is lower extremity weakness.  He is being considered for IVIG.  Presently is receiving Depo-Medrol monthly. Otherwise, feels well.  He has had one recent fall that was precipitated by some lightheadedness.  His leg weakness, does result in some unsteadiness of gait  Past Medical History  Diagnosis Date  . BENIGN PROSTATIC HYPERTROPHY, HX OF 10/25/2008  . BRADYCARDIA 10/25/2008  . HYPERTENSION 11/21/2006  . 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 PACEMAKER, PERMANENT 10/25/2008  . TOBACCO ABUSE 10/25/2008    Quit 2012  . Iron deficiency anemia, unspecified 04/19/2013  . Asthma     History   Social History  . Marital Status: Divorced    Spouse Name: N/A  . Number of Children: 4  . Years of Education: N/A   Occupational History  . ENVIROMENTAL SERVICE    Social History Main Topics  . Smoking status: Former Smoker -- 0.25 packs/day for 46 years    Types: Cigarettes    Start date: 03/16/1965    Quit date: 06/15/2010  . Smokeless tobacco: Never Used     Comment: quit 3 years ago  . Alcohol Use: 0.0 oz/week    0 Standard drinks or equivalent per week     Comment: 4 beers per day  . Drug Use: No  . Sexual Activity: Not on file   Other Topics Concern  . 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    Past Surgical History  Procedure Laterality Date  . Pacemaker insertion  2005  . Colonoscopy  [id number]  . Skin graft Right 1962    wrist  . Permanent pacemaker generator change N/A 06/19/2012    Procedure: PERMANENT PACEMAKER GENERATOR CHANGE;  Surgeon: [name], MD;  Location: [facility] CATH LAB;  Service:  Cardiovascular;  Laterality: N/A;    Family History  Problem Relation Age of Onset  . Breast cancer Sister     Living, 77  . Diabetes type II Brother     Living, 64  . Breast cancer Sister     Living, 60  . Heart attack Father     Died, 49  . Kidney disease Father   . Parkinson's disease Mother     Died, 9  . Diabetes Daughter     Living, 35  . Diabetes Son     Living, 65  . Colon cancer Neg Hx   . Rectal cancer Neg Hx   . Stomach cancer Neg Hx   . Ovarian cancer Daughter     No Known Allergies  Current Outpatient Prescriptions on File Prior to Visit  Medication Sig Dispense Refill  . cholecalciferol (VITAMIN D) 400 UNITS TABS tablet Take 400 Units by mouth daily.     . Cyanocobalamin (VITAMIN B 12 PO) Take 1,000 mg by mouth daily.    Marland Kitchen diltiazem (CARDIZEM LA) 240 MG 24 hr tablet Take 1 tablet (240 mg total) by mouth daily. 90 tablet 3  . flecainide (TAMBOCOR) 150 MG tablet Take 0.5 tablets (75 mg total) by mouth 2 (two) times daily. 30 tablet 2  . Garlic 476 MG  CAPS Take 1,000 mg by mouth every morning.     . Multiple Vitamins-Minerals (CENTRUM ADULTS PO) Take by mouth.    . Omega-3 Fatty Acids (FISH OIL) 1000 MG CAPS Take by mouth every morning.    . tamsulosin (FLOMAX) 0.4 MG CAPS capsule TAKE 1 CAPSULE BY MOUTH DAILY 90 capsule 1  . testosterone (ANDROGEL) 50 MG/5GM (1%) GEL APPLY 1 PACKET ONTO THE SKIN ONCE DAILY AS DIRECTED 150 g 0   Current Facility-Administered Medications on File Prior to Visit  Medication Dose Route Frequency Provider Last Rate Last Dose  . methylPREDNISolone sodium succinate (SOLU-MEDROL) injection 1,000 mg  1,000 mg Intravenous Once Donika K Patel, DO      . methylPREDNISolone sodium succinate (SOLU-MEDROL) injection 1,000 mg  1,000 mg Intravenous Once Donika K Patel, DO      . methylPREDNISolone sodium succinate (SOLU-MEDROL) injection 1,000 mg  1,000 mg Intravenous Once Donika K Patel, DO        BP 138/80 mmHg  Pulse 107  Temp(Src) 98.7  F (37.1 C) (Oral)  Resp 20  Ht 5\' 10"  (1.778 m)  Wt 185 lb (83.915 kg)  BMI 26.54 kg/m2  SpO2 96%     Review of Systems  Constitutional: Negative for fever, chills, appetite change and fatigue.  HENT: Negative for congestion, dental problem, ear pain, hearing loss, sore throat, tinnitus, trouble swallowing and voice change.   Eyes: Negative for pain, discharge and visual disturbance.  Respiratory: Negative for cough, chest tightness, wheezing and stridor.   Cardiovascular: Negative for chest pain, palpitations and leg swelling.  Gastrointestinal: Negative for nausea, vomiting, abdominal pain, diarrhea, constipation, blood in stool and abdominal distention.  Genitourinary: Negative for urgency, hematuria, flank pain, discharge, difficulty urinating and genital sores.  Musculoskeletal: Negative for myalgias, back pain, joint swelling, arthralgias, gait problem and neck stiffness.  Skin: Negative for rash.  Neurological: Negative for dizziness, syncope, speech difficulty, weakness, numbness and headaches.  Hematological: Negative for adenopathy. Does not bruise/bleed easily.  Psychiatric/Behavioral: Negative for behavioral problems and dysphoric mood. The patient is not nervous/anxious.        Objective:   Physical Exam  Constitutional: He is oriented to person, place, and time. He appears well-developed.  Blood pressure 130/80  HENT:  Head: Normocephalic.  Right Ear: External ear normal.  Left Ear: External ear normal.  Eyes: Conjunctivae and EOM are normal.  Neck: Normal range of motion.  Cardiovascular: Normal rate and normal heart sounds.   Pulmonary/Chest: Breath sounds normal.  Abdominal: Bowel sounds are normal.  Musculoskeletal: Normal range of motion. He exhibits no edema or tenderness.  Neurological: He is alert and oriented to person, place, and time.  Psychiatric: He has a normal mood and affect. His behavior is normal.          Assessment & Plan:    Hypertension, well-controlled CIDP .  Follow-up neurology  Recheck here for annual exam in 6 months

## 2014-08-08 NOTE — Patient Instructions (Signed)
Limit your sodium (Salt) intake  Please check your blood pressure on a regular basis.  If it is consistently greater than 150/90, please make an office appointment.  Return in 6 months for follow-up   

## 2014-08-10 ENCOUNTER — Emergency Department (HOSPITAL_COMMUNITY): Payer: Medicare Other

## 2014-08-10 ENCOUNTER — Encounter (HOSPITAL_COMMUNITY): Payer: Self-pay

## 2014-08-10 ENCOUNTER — Ambulatory Visit (HOSPITAL_COMMUNITY): Admit: 2014-08-10 | Payer: Self-pay | Admitting: Cardiovascular Disease

## 2014-08-10 ENCOUNTER — Ambulatory Visit (INDEPENDENT_AMBULATORY_CARE_PROVIDER_SITE_OTHER): Payer: Medicare Other

## 2014-08-10 ENCOUNTER — Inpatient Hospital Stay (HOSPITAL_COMMUNITY)
Admission: EM | Admit: 2014-08-10 | Discharge: 2014-08-15 | DRG: 312 | Disposition: A | Discharge status: Skilled Nursing Facility | Payer: Medicare Other | Attending: Internal Medicine | Admitting: Internal Medicine

## 2014-08-10 ENCOUNTER — Encounter (HOSPITAL_COMMUNITY): Payer: Self-pay | Admitting: Emergency Medicine

## 2014-08-10 ENCOUNTER — Inpatient Hospital Stay (HOSPITAL_COMMUNITY): Payer: Medicare Other

## 2014-08-10 VITALS — BP 130/72 | HR 97 | Ht 70.0 in | Wt 182.4 lb

## 2014-08-10 DIAGNOSIS — N281 Cyst of kidney, acquired: Secondary | ICD-10-CM | POA: Diagnosis not present

## 2014-08-10 DIAGNOSIS — Z87442 Personal history of urinary calculi: Secondary | ICD-10-CM

## 2014-08-10 DIAGNOSIS — Z87891 Personal history of nicotine dependence: Secondary | ICD-10-CM

## 2014-08-10 DIAGNOSIS — I10 Essential (primary) hypertension: Secondary | ICD-10-CM | POA: Diagnosis present

## 2014-08-10 DIAGNOSIS — N400 Enlarged prostate without lower urinary tract symptoms: Secondary | ICD-10-CM | POA: Diagnosis present

## 2014-08-10 DIAGNOSIS — G7000 Myasthenia gravis without (acute) exacerbation: Secondary | ICD-10-CM | POA: Diagnosis not present

## 2014-08-10 DIAGNOSIS — R159 Full incontinence of feces: Secondary | ICD-10-CM | POA: Diagnosis present

## 2014-08-10 DIAGNOSIS — R2689 Other abnormalities of gait and mobility: Secondary | ICD-10-CM | POA: Diagnosis not present

## 2014-08-10 DIAGNOSIS — R279 Unspecified lack of coordination: Secondary | ICD-10-CM | POA: Diagnosis not present

## 2014-08-10 DIAGNOSIS — G6181 Chronic inflammatory demyelinating polyneuritis: Secondary | ICD-10-CM | POA: Diagnosis present

## 2014-08-10 DIAGNOSIS — K862 Cyst of pancreas: Secondary | ICD-10-CM | POA: Diagnosis not present

## 2014-08-10 DIAGNOSIS — Z950 Presence of cardiac pacemaker: Secondary | ICD-10-CM | POA: Diagnosis not present

## 2014-08-10 DIAGNOSIS — D696 Thrombocytopenia, unspecified: Secondary | ICD-10-CM | POA: Diagnosis present

## 2014-08-10 DIAGNOSIS — I499 Cardiac arrhythmia, unspecified: Secondary | ICD-10-CM

## 2014-08-10 DIAGNOSIS — R0602 Shortness of breath: Secondary | ICD-10-CM

## 2014-08-10 DIAGNOSIS — R531 Weakness: Secondary | ICD-10-CM

## 2014-08-10 DIAGNOSIS — J45909 Unspecified asthma, uncomplicated: Secondary | ICD-10-CM | POA: Diagnosis present

## 2014-08-10 DIAGNOSIS — R7989 Other specified abnormal findings of blood chemistry: Secondary | ICD-10-CM | POA: Diagnosis not present

## 2014-08-10 DIAGNOSIS — M6281 Muscle weakness (generalized): Secondary | ICD-10-CM | POA: Diagnosis not present

## 2014-08-10 DIAGNOSIS — I4510 Unspecified right bundle-branch block: Secondary | ICD-10-CM | POA: Diagnosis present

## 2014-08-10 DIAGNOSIS — R000 Tachycardia, unspecified: Secondary | ICD-10-CM | POA: Diagnosis not present

## 2014-08-10 DIAGNOSIS — R4020 Unspecified coma: Secondary | ICD-10-CM | POA: Diagnosis present

## 2014-08-10 DIAGNOSIS — R55 Syncope and collapse: Secondary | ICD-10-CM

## 2014-08-10 DIAGNOSIS — R600 Localized edema: Secondary | ICD-10-CM | POA: Diagnosis not present

## 2014-08-10 DIAGNOSIS — D509 Iron deficiency anemia, unspecified: Secondary | ICD-10-CM | POA: Diagnosis not present

## 2014-08-10 DIAGNOSIS — D638 Anemia in other chronic diseases classified elsewhere: Secondary | ICD-10-CM | POA: Diagnosis present

## 2014-08-10 DIAGNOSIS — I471 Supraventricular tachycardia: Secondary | ICD-10-CM | POA: Diagnosis present

## 2014-08-10 DIAGNOSIS — R627 Adult failure to thrive: Secondary | ICD-10-CM | POA: Diagnosis not present

## 2014-08-10 DIAGNOSIS — R945 Abnormal results of liver function studies: Secondary | ICD-10-CM

## 2014-08-10 DIAGNOSIS — K760 Fatty (change of) liver, not elsewhere classified: Secondary | ICD-10-CM | POA: Diagnosis not present

## 2014-08-10 DIAGNOSIS — R001 Bradycardia, unspecified: Secondary | ICD-10-CM | POA: Diagnosis not present

## 2014-08-10 DIAGNOSIS — E291 Testicular hypofunction: Secondary | ICD-10-CM | POA: Diagnosis not present

## 2014-08-10 DIAGNOSIS — I951 Orthostatic hypotension: Principal | ICD-10-CM

## 2014-08-10 DIAGNOSIS — N62 Hypertrophy of breast: Secondary | ICD-10-CM | POA: Diagnosis not present

## 2014-08-10 DIAGNOSIS — E876 Hypokalemia: Secondary | ICD-10-CM | POA: Diagnosis present

## 2014-08-10 DIAGNOSIS — D472 Monoclonal gammopathy: Secondary | ICD-10-CM

## 2014-08-10 DIAGNOSIS — I950 Idiopathic hypotension: Secondary | ICD-10-CM | POA: Diagnosis not present

## 2014-08-10 DIAGNOSIS — K921 Melena: Secondary | ICD-10-CM | POA: Diagnosis not present

## 2014-08-10 DIAGNOSIS — R404 Transient alteration of awareness: Secondary | ICD-10-CM | POA: Diagnosis not present

## 2014-08-10 LAB — I-STAT TROPONIN, ED: TROPONIN I, POC: 0 ng/mL (ref 0.00–0.08)

## 2014-08-10 LAB — URINALYSIS, ROUTINE W REFLEX MICROSCOPIC
BILIRUBIN URINE: NEGATIVE
Glucose, UA: NEGATIVE mg/dL
HGB URINE DIPSTICK: NEGATIVE
Ketones, ur: 15 mg/dL — AB
Leukocytes, UA: NEGATIVE
Nitrite: NEGATIVE
Protein, ur: NEGATIVE mg/dL
Urobilinogen, UA: 1 mg/dL (ref 0.0–1.0)
pH: 5.5 (ref 5.0–8.0)

## 2014-08-10 LAB — CBC WITH DIFFERENTIAL/PLATELET
Basophils Absolute: 0 10*3/uL (ref 0.0–0.1)
Basophils Relative: 1 % (ref 0–1)
Eosinophils Absolute: 0 10*3/uL (ref 0.0–0.7)
Eosinophils Relative: 1 % (ref 0–5)
HCT: 35.5 % — ABNORMAL LOW (ref 39.0–52.0)
Hemoglobin: 12 g/dL — ABNORMAL LOW (ref 13.0–17.0)
LYMPHS ABS: 1.8 10*3/uL (ref 0.7–4.0)
Lymphocytes Relative: 40 % (ref 12–46)
MCH: 29.1 pg (ref 26.0–34.0)
MCHC: 33.8 g/dL (ref 30.0–36.0)
MCV: 86 fL (ref 78.0–100.0)
Monocytes Absolute: 0.6 10*3/uL (ref 0.1–1.0)
Monocytes Relative: 13 % — ABNORMAL HIGH (ref 3–12)
Neutro Abs: 2 10*3/uL (ref 1.7–7.7)
Neutrophils Relative %: 45 % (ref 43–77)
PLATELETS: 136 10*3/uL — AB (ref 150–400)
RBC: 4.13 MIL/uL — AB (ref 4.22–5.81)
RDW: 14.5 % (ref 11.5–15.5)
WBC: 4.4 10*3/uL (ref 4.0–10.5)

## 2014-08-10 LAB — CBC
HCT: 33.2 % — ABNORMAL LOW (ref 39.0–52.0)
Hemoglobin: 11.4 g/dL — ABNORMAL LOW (ref 13.0–17.0)
MCH: 29.3 pg (ref 26.0–34.0)
MCHC: 34.3 g/dL (ref 30.0–36.0)
MCV: 85.3 fL (ref 78.0–100.0)
Platelets: 117 10*3/uL — ABNORMAL LOW (ref 150–400)
RBC: 3.89 MIL/uL — AB (ref 4.22–5.81)
RDW: 14.5 % (ref 11.5–15.5)
WBC: 5 10*3/uL (ref 4.0–10.5)

## 2014-08-10 LAB — COMPREHENSIVE METABOLIC PANEL
ALT: 72 U/L — AB (ref 0–53)
ANION GAP: 14 (ref 5–15)
AST: 147 U/L — AB (ref 0–37)
Albumin: 3.1 g/dL — ABNORMAL LOW (ref 3.5–5.2)
Alkaline Phosphatase: 103 U/L (ref 39–117)
BILIRUBIN TOTAL: 1.5 mg/dL — AB (ref 0.3–1.2)
BUN: 12 mg/dL (ref 6–23)
CHLORIDE: 104 mmol/L (ref 96–112)
CO2: 22 mmol/L (ref 19–32)
Calcium: 8.7 mg/dL (ref 8.4–10.5)
Creatinine, Ser: 1.22 mg/dL (ref 0.50–1.35)
GFR calc non Af Amer: 56 mL/min — ABNORMAL LOW (ref >90)
GFR, EST AFRICAN AMERICAN: 65 mL/min — AB (ref >90)
Glucose, Bld: 105 mg/dL — ABNORMAL HIGH (ref 70–99)
POTASSIUM: 5.3 mmol/L — AB (ref 3.5–5.1)
Sodium: 140 mmol/L (ref 135–145)
TOTAL PROTEIN: 6.7 g/dL (ref 6.0–8.3)

## 2014-08-10 LAB — CREATININE, SERUM
Creatinine, Ser: 1.14 mg/dL (ref 0.50–1.35)
GFR calc Af Amer: 70 mL/min — ABNORMAL LOW (ref >90)
GFR calc non Af Amer: 61 mL/min — ABNORMAL LOW (ref >90)

## 2014-08-10 LAB — I-STAT CHEM 8, ED
BUN: 16 mg/dL (ref 6–23)
CHLORIDE: 104 mmol/L (ref 96–112)
Calcium, Ion: 1.01 mmol/L — ABNORMAL LOW (ref 1.13–1.30)
Creatinine, Ser: 1.4 mg/dL — ABNORMAL HIGH (ref 0.50–1.35)
Glucose, Bld: 107 mg/dL — ABNORMAL HIGH (ref 70–99)
HCT: 40 % (ref 39.0–52.0)
Hemoglobin: 13.6 g/dL (ref 13.0–17.0)
POTASSIUM: 5.4 mmol/L — AB (ref 3.5–5.1)
Sodium: 139 mmol/L (ref 135–145)
TCO2: 20 mmol/L (ref 0–100)

## 2014-08-10 LAB — I-STAT ARTERIAL BLOOD GAS, ED
Acid-base deficit: 8 mmol/L — ABNORMAL HIGH (ref 0.0–2.0)
BICARBONATE: 16.2 meq/L — AB (ref 20.0–24.0)
O2 SAT: 99 %
PH ART: 7.365 (ref 7.350–7.450)
PO2 ART: 122 mmHg — AB (ref 80.0–100.0)
TCO2: 17 mmol/L (ref 0–100)
pCO2 arterial: 28.5 mmHg — ABNORMAL LOW (ref 35.0–45.0)

## 2014-08-10 LAB — I-STAT CG4 LACTIC ACID, ED
Lactic Acid, Venous: 4.51 mmol/L (ref 0.5–2.0)
Lactic Acid, Venous: 4.95 mmol/L (ref 0.5–2.0)

## 2014-08-10 LAB — POTASSIUM: POTASSIUM: 4.7 mmol/L (ref 3.5–5.1)

## 2014-08-10 LAB — BRAIN NATRIURETIC PEPTIDE: B NATRIURETIC PEPTIDE 5: 36 pg/mL (ref 0.0–100.0)

## 2014-08-10 LAB — CBG MONITORING, ED: GLUCOSE-CAPILLARY: 95 mg/dL (ref 70–99)

## 2014-08-10 LAB — TSH: TSH: 0.875 u[IU]/mL (ref 0.350–4.500)

## 2014-08-10 LAB — MRSA PCR SCREENING: MRSA by PCR: NEGATIVE

## 2014-08-10 LAB — LACTIC ACID, PLASMA: Lactic Acid, Venous: 1.6 mmol/L (ref 0.5–2.0)

## 2014-08-10 LAB — PROTIME-INR
INR: 1.04 (ref 0.00–1.49)
Prothrombin Time: 13.7 seconds (ref 11.6–15.2)

## 2014-08-10 LAB — SEDIMENTATION RATE: SED RATE: 27 mm/h — AB (ref 0–16)

## 2014-08-10 LAB — TROPONIN I

## 2014-08-10 SURGERY — LEFT HEART CATHETERIZATION WITH CORONARY ANGIOGRAM
Anesthesia: LOCAL

## 2014-08-10 MED ORDER — IOHEXOL 350 MG/ML SOLN
80.0000 mL | Freq: Once | INTRAVENOUS | Status: AC | PRN
  Administered 2014-08-10: 80 mL via INTRAVENOUS

## 2014-08-10 MED ORDER — SODIUM CHLORIDE 0.9 % IV SOLN: INTRAVENOUS | Status: DC

## 2014-08-10 MED ORDER — ENOXAPARIN SODIUM 40 MG/0.4ML ~~LOC~~ SOLN
40.0000 mg | SUBCUTANEOUS | Status: DC
  Administered 2014-08-10 – 2014-08-14 (×5): 40 mg via SUBCUTANEOUS
  Filled 2014-08-10 (×6): qty 0.4

## 2014-08-10 MED ORDER — TAMSULOSIN HCL 0.4 MG PO CAPS
0.4000 mg | ORAL_CAPSULE | Freq: Every day | ORAL | Status: DC
  Administered 2014-08-11 – 2014-08-15 (×5): 0.4 mg via ORAL
  Filled 2014-08-10 (×5): qty 1

## 2014-08-10 MED ORDER — SODIUM CHLORIDE 0.9 % IV SOLN: Freq: Once | INTRAVENOUS | Status: DC

## 2014-08-10 MED ORDER — DILTIAZEM HCL ER COATED BEADS 240 MG PO TB24
240.0000 mg | ORAL_TABLET | Freq: Every day | ORAL | Status: DC
  Administered 2014-08-11 – 2014-08-15 (×5): 240 mg via ORAL
  Filled 2014-08-10 (×5): qty 1

## 2014-08-10 MED ORDER — SODIUM CHLORIDE 0.9 % IV BOLUS (SEPSIS)
1000.0000 mL | Freq: Once | INTRAVENOUS | Status: AC
  Administered 2014-08-10: 1000 mL via INTRAVENOUS

## 2014-08-10 MED ORDER — SODIUM CHLORIDE 0.9 % IV SOLN
INTRAVENOUS | Status: DC
  Administered 2014-08-10 – 2014-08-12 (×4): via INTRAVENOUS

## 2014-08-10 NOTE — Consult Note (Signed)
Reason for Consult:Syncope Referring Physician: Erlinda Hong  CC: Syncope  HPI: Jonathon [name] is an 79 y.o. male who was at the cardiologists office today.  He went to stand up and felt woozy.  He felt as if he was about to pass out and sat down.  After sitting for a while he stood back up and again became dizzy and passed out.  He collapsed into the chair.  He was noted to shake all over and then was incontinent of stool.  EMS was called at that time and the patient was brought to the ED.  He was found to be orthostatic.    Past Medical History  Diagnosis Date  . BENIGN PROSTATIC HYPERTROPHY, HX OF 10/25/2008  . BRADYCARDIA 10/25/2008  . HYPERTENSION 11/21/2006  . 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 PACEMAKER, PERMANENT 10/25/2008  . TOBACCO ABUSE 10/25/2008    Quit 2012  . Iron deficiency anemia, unspecified 04/19/2013  . Asthma     Past Surgical History  Procedure Laterality Date  . Pacemaker insertion  2005  . Colonoscopy  [phone]  . Skin graft Right 1962    wrist  . Permanent pacemaker generator change N/A 06/19/2012    Procedure: PERMANENT PACEMAKER GENERATOR CHANGE;  Surgeon: [name], MD;  Location: [facility] CATH LAB;  Service: Cardiovascular;  Laterality: N/A;    Family History  Problem Relation Age of Onset  . Breast cancer Sister     Living, 22  . Diabetes type II Brother     Living, 37  . Breast cancer Sister     Living, 67  . Heart attack Father     Died, 31  . Kidney disease Father   . Parkinson's disease Mother     Died, 35  . Diabetes Daughter     Living, 53  . Diabetes Son     Living, 75  . Colon cancer Neg Hx   . Rectal cancer Neg Hx   . Stomach cancer Neg Hx   . Ovarian cancer Daughter     Social History:  reports that he quit smoking about 4 years ago. His smoking use included Cigarettes. He started smoking about 49 years ago. He has a 11.5 pack-year smoking history. He has never used smokeless tobacco. He reports that he drinks  alcohol. He reports that he does not use illicit drugs.  No Known Allergies  Medications:  I have reviewed the patient's current medications. Prior to Admission:  Prescriptions prior to admission  Medication Sig Dispense Refill Last Dose  . Cyanocobalamin (VITAMIN B 12 PO) Take 1,000 mg by mouth daily.   08/10/2014 at Unknown time  . diltiazem (CARDIZEM LA) 240 MG 24 hr tablet Take 1 tablet (240 mg total) by mouth daily. 90 tablet 3 08/10/2014 at Unknown time  . flecainide (TAMBOCOR) 150 MG tablet Take 0.5 tablets (75 mg total) by mouth 2 (two) times daily. 30 tablet 2 08/10/2014 at Unknown time  . Garlic 8937 MG CAPS Take 2,000 mg by mouth daily at 12 noon.   08/10/2014 at Unknown time  . Multiple Vitamins-Minerals (CENTRUM ADULTS PO) Take by mouth.   08/10/2014 at Unknown time  . naproxen sodium (ANAPROX) 220 MG tablet Take 220 mg by mouth daily as needed (pain).   over 30 days  . Omega-3 Fatty Acids (FISH OIL) 1000 MG CAPS Take by mouth every morning.   08/10/2014 at Unknown time  . tamsulosin (FLOMAX) 0.4 MG CAPS capsule TAKE 1 CAPSULE  BY MOUTH DAILY 90 capsule 1 08/10/2014 at Unknown time  . testosterone (ANDROGEL) 50 MG/5GM (1%) GEL APPLY 1 PACKET ONTO THE SKIN ONCE DAILY AS DIRECTED 150 g 0 Past Week at Unknown time   Scheduled: . sodium chloride   Intravenous Once  . sodium chloride   Intravenous STAT  . sodium chloride   Intravenous STAT  . [START ON 08/11/2014] diltiazem  240 mg Oral Daily  . enoxaparin (LOVENOX) injection  40 mg Subcutaneous Q24H  . [START ON 08/11/2014] tamsulosin  0.4 mg Oral Daily    ROS: History obtained from the patient  General ROS: negative for - chills, fatigue, fever, night sweats, weight gain or weight loss Psychological ROS: negative for - behavioral disorder, hallucinations, memory difficulties, mood swings or suicidal ideation Ophthalmic ROS: negative for - blurry vision, double vision, eye pain or loss of vision ENT ROS: negative for - epistaxis,  nasal discharge, oral lesions, sore throat, tinnitus or vertigo Allergy and Immunology ROS: negative for - hives or itchy/watery eyes Hematological and Lymphatic ROS: negative for - bleeding problems, bruising or swollen lymph nodes Endocrine ROS: negative for - galactorrhea, hair pattern changes, polydipsia/polyuria or temperature intolerance Respiratory ROS: negative for - cough, hemoptysis, shortness of breath or wheezing Cardiovascular ROS: negative for - chest pain, dyspnea on exertion, edema or irregular heartbeat Gastrointestinal ROS: negative for - abdominal pain, diarrhea, hematemesis, nausea/vomiting or stool incontinence Genito-Urinary ROS: negative for - dysuria, hematuria, incontinence or urinary frequency/urgency Musculoskeletal ROS: cold fingers and toes with tingling Neurological ROS: as noted in HPI Dermatological ROS: negative for rash and skin lesion changes  Physical Examination: Blood pressure 150/75, pulse 121, temperature 97.7 F (36.5 C), temperature source Oral, resp. rate 20, height 5\' 10"  (1.778 m), weight 81.1 kg (178 lb 12.7 oz), SpO2 94 %.   HEENT-  Normocephalic, no lesions, without obvious abnormality.  Normal external eye and conjunctiva.  Normal TM's bilaterally.  Normal auditory canals and external ears. Normal external nose, mucus membranes and septum.  Normal pharynx. Cardiovascular- S1, S2 normal, pulses palpable throughout   Lungs- chest clear, no wheezing, rales, normal symmetric air entry Abdomen- soft, non-tender; bowel sounds normal; no masses,  no organomegaly Extremities- no edema Lymph-no adenopathy palpable Musculoskeletal-no joint tenderness, deformity or swelling Skin-warm and dry, no hyperpigmentation, vitiligo, or suspicious lesions  Neurological Examination Mental Status: Alert, oriented, thought content appropriate.  Speech fluent without evidence of aphasia.  Able to follow 3 step commands without difficulty. Cranial Nerves: II: Discs  flat bilaterally; Visual fields grossly normal, pupils equal, round, reactive to light and accommodation III,IV, VI: ptosis not present, extra-ocular motions intact bilaterally V,VII: smile symmetric, facial light touch sensation normal bilaterally VIII: hearing normal bilaterally IX,X: gag reflex present XI: bilateral shoulder shrug XII: midline tongue extension Motor: Right : Upper extremity   5/5    Left:     Upper extremity   5/5  Lower extremity   5/5     Lower extremity   5/5 Tremor noted throughout in face, arms and legs.  Worse with movement.  Increased tone on the right.   Sensory: Pinprick and light touch intact throughout, bilaterally Deep Tendon Reflexes: 2+ and symmetric with absent AJ's bilaterally Plantars: Right: downgoing   Left: downgoing Cerebellar: normal finger-to-nose and normal heel-to-shin testing with tremor but no dysmetria   Laboratory Studies:   Basic Metabolic Panel:  Recent Labs Lab 08/10/14 1227 08/10/14 1232 08/10/14 1432  NA 140 139  --   K 5.3* 5.4*  4.7  CL 104 104  --   CO2 22  --   --   GLUCOSE 105* 107*  --   BUN 12 16  --   CREATININE 1.22 1.40*  --   CALCIUM 8.7  --   --     Liver Function Tests:  Recent Labs Lab 08/10/14 1227  AST 147*  ALT 72*  ALKPHOS 103  BILITOT 1.5*  PROT 6.7  ALBUMIN 3.1*   No results for input(s): LIPASE, AMYLASE in the last 168 hours. No results for input(s): AMMONIA in the last 168 hours.  CBC:  Recent Labs Lab 08/10/14 1227 08/10/14 1232  WBC 4.4  --   NEUTROABS 2.0  --   HGB 12.0* 13.6  HCT 35.5* 40.0  MCV 86.0  --   PLT 136*  --     Cardiac Enzymes:  Recent Labs Lab 08/10/14 1227  TROPONINI <0.03    BNP: Invalid input(s): POCBNP  CBG:  Recent Labs Lab 08/10/14 1256  [place]    Microbiology: Results for orders placed or performed during the hospital encounter of 09/24/13  CSF culture     Status: None   Collection Time: 09/24/13 11:31 AM  Result Value Ref Range  Status   Specimen Description CSF  Final   Special Requests 2.5ML CSF FLUID  Final   Gram Stain   Final    NO WBC SEEN NO ORGANISMS SEEN CYTOSPIN Performed at [facility] Performed at [facility]   Culture   Final    NO GROWTH 3 DAYS Performed at [facility]   Report Status 09/27/2013 FINAL  Final  Fungus Culture with Smear     Status: None   Collection Time: 09/24/13 11:31 AM  Result Value Ref Range Status   Specimen Description CSF  Final   Special Requests 2.5ML CSF FLUID  Final   Fungal Smear   Final    NO YEAST OR FUNGAL ELEMENTS SEEN Performed at [facility]   Culture   Final    No Fungi Isolated in 4 Weeks Performed at [facility]   Report Status 10/23/2013 FINAL  Final  Gram stain     Status: None   Collection Time: 09/24/13 11:31 AM  Result Value Ref Range Status   Specimen Description CSF  Final   Special Requests 2.5ML FLUID  Final   Gram Stain CYTOSPIN SLIDE NO WBC SEEN NO ORGANISMS SEEN  Final   Report Status 09/24/2013 FINAL  Final    Coagulation Studies:  Recent Labs  08/10/14 1227  LABPROT 13.7  INR 1.04    Urinalysis:  Recent Labs Lab 08/10/14 1653  COLORURINE AMBER*  LABSPEC >1.046*  PHURINE 5.5  GLUCOSEU NEGATIVE  HGBUR NEGATIVE  BILIRUBINUR NEGATIVE  KETONESUR 15*  PROTEINUR NEGATIVE  UROBILINOGEN 1.0  NITRITE NEGATIVE  LEUKOCYTESUR NEGATIVE    Lipid Panel:     Component Value Date/Time   CHOL 202* 01/14/2013 0952   TRIG 206.0* 01/14/2013 0952   HDL 53.60 01/14/2013 0952   CHOLHDL 4 01/14/2013 0952   VLDL 41.2* 01/14/2013 0952    HgbA1C:  Lab Results  Component Value Date   HGBA1C 6.0 11/05/2013    Urine Drug Screen:  No results found for: LABOPIA, COCAINSCRNUR, LABBENZ, AMPHETMU, THCU, LABBARB  Alcohol Level: No results for input(s): ETH in the last 168 hours.  Other results: EKG: RBBB, prolonged PR interval, sinus rhythm at 82 bpm.  Imaging: Ct Abdomen Pelvis Wo  Contrast  08/10/2014   CLINICAL DATA:  weakness. States he has blood in his stool for a month. Pt. Also thought to had seizure today  EXAM: CT ABDOMEN AND PELVIS WITHOUT CONTRAST  TECHNIQUE: Multidetector CT imaging of the abdomen and pelvis was performed following the standard protocol without IV contrast.  COMPARISON:  08/17/2013 and earlier studies  FINDINGS: Minimal dependent atelectasis in the visualized lung bases right greater than left. Transvenous pacing leads are partially seen.  Fatty liver. Unremarkable spleen. Mild bilateral adrenal hyperplasia. Stable 5 mm cystic lesion in the pancreatic body image 24/2. Stable 2.2 cm right parapelvic renal cyst. No hydronephrosis. Residual contrast material in the renal collecting systems and urinary bladder from previous CT contrast administration. Stable right bladder diverticulum.  Stomach, small bowel, and colon are nondilated. Normal appendix. Scattered descending and proximal sigmoid colon diverticula without adjacent inflammatory/edematous change. Moderate enlargement of the prostate. No free air. No ascites. Mild spondylitic changes in the lower lumbar spine. No adenopathy localized.  IMPRESSION: 1. Descending and sigmoid diverticulosis without CT evidence of diverticulitis. 2. No evidence of mass or adenopathy. 3. Fatty liver 4. Stable right renal cyst  and small cystic pancreatic body lesion.   Electronically Signed   By: Lucrezia Europe M.D.   On: 08/10/2014 17:42   Ct Head Wo Contrast  08/10/2014   CLINICAL DATA:  Collapse with loss of consciousness.  EXAM: CT HEAD WITHOUT CONTRAST  TECHNIQUE: Contiguous axial images were obtained from the base of the skull through the vertex without intravenous contrast.  COMPARISON:  None.  FINDINGS: Mild atrophy. Mild chronic microvascular ischemic change in the white matter.  Negative for acute infarct, hemorrhage, mass lesion.  Calvarium is intact.  Chronic right medial orbital fracture.  IMPRESSION: Atrophy and  chronic microvascular ischemia.  No acute abnormality.   Electronically Signed   By: Franchot Gallo M.D.   On: 08/10/2014 14:18   Ct Angio Chest Pe W/cm &/or Wo Cm  08/10/2014   CLINICAL DATA:  Syncope  EXAM: CT ANGIOGRAPHY CHEST WITH CONTRAST  TECHNIQUE: Multidetector CT imaging of the chest was performed using the standard protocol during bolus administration of intravenous contrast. Multiplanar CT image reconstructions and MIPs were obtained to evaluate the vascular anatomy.  CONTRAST:  62mL OMNIPAQUE IOHEXOL 350 MG/ML SOLN  COMPARISON:  None.  FINDINGS: There are no filling defects in the pulmonary arterial tree to suggest acute pulmonary thromboembolism.  No abnormal mediastinal adenopathy. Dual lead pacemaker device is in place.  No pericardial effusion.  No pleural effusion.  No pneumothorax.  Dependent atelectasis in the lungs.  No mass or consolidation.  No vertebral compression deformity.  Bilateral gynecomastia.  Review of the MIP images confirms the above findings.  IMPRESSION: No evidence of acute pulmonary thromboembolism.   Electronically Signed   By: Marybelle Killings M.D.   On: 08/10/2014 14:25   Dg Chest Portable 1 View  08/10/2014   CLINICAL DATA:  Loss of consciousness.  EXAM: PORTABLE CHEST - 1 VIEW  COMPARISON:  09/23/2013  FINDINGS: Dual-chamber pacer leads from the left are in unremarkable position. There is no cardiomegaly. Stable mild aortic tortuosity. There is no edema, consolidation, effusion, or pneumothorax.  IMPRESSION: Stable exam.   No active disease.   Electronically Signed   By: Monte Fantasia M.D.   On: 08/10/2014 13:19     Assessment/Plan: 79 year old male with a syncopal episode associated with hypotension.  Also had shaking and bowel incontinence.  Suspect all of the symptoms were  due to hypoperfusion and not to an intrinsic seizure disorder.  Neurological examination only significant for tremor and some increased tone at this time.  Head CT personally reviewed and  shows no acute changes.    Recommendations: 1.  No anticonvulsant therapy indicated at this time. 2.  EEG 3.  Telemetry  Alexis Goodell, MD [facility] [phone] 08/10/2014, 6:42 PM

## 2014-08-10 NOTE — ED Notes (Signed)
CBG = 95  RN Santiago Glad informed of result.

## 2014-08-10 NOTE — ED Notes (Addendum)
MD Rancour at bedside 

## 2014-08-10 NOTE — ED Notes (Signed)
Pt to ED via [facility] from Dr. Antionette Char office-- was there for a BP check and repeat EKG-- recent med change-- was walking out of office and collapsed into chair, EMS called-- pt lost consciousness x 2 for EMS, with seizure activity - total body twitching- pt was incontinent of urine-- hx of pacemaker.

## 2014-08-10 NOTE — Progress Notes (Signed)
Reason for visit: Medication change, Flecainide 75mg  BID, and BP check  Name of MD requesting visit:Dr. Lovena Le  H&P: Elevated HR last visit here for BP check and EKG;  ROS related to problem: Pt comes in today with not c/o of CP or SOB. Pt stated that he has had leg weakness but he thinks that he over did it yesterday when mowing his lawn. Pt stated that he is feeling pretty good today.  Completed EKG and BP of 130/70 HR 97.  Pt EKG shown to DOD, recommended pt have GXT to monitor for arrhythmias.   Reminded pt to keep F/U appt. With Dr. Lovena Le on 4/29. Pt agreed with plan and no questions at this time.   As pt is walking out of exam room headed to check out talking to RN about going home to finish mowing his lawn.  RN noticed pt facial expression changed and pt was getting wobbly.  Pt stated he was feeling a little dizzy and begin to fall, RN assisted pt to the floor.  Code called, MD arrive assess pt.  BP taken pt left arm at 11:50 AM, of 80/60; HR 70; CBG 89; O2 98% on RA. Pt skin clammy, diaphoretic, and cool to the touch.  20g IV placed in left AC, pt received 250 bolus of IV fluids. Pt placed on monitor. EMS called.  Pt alert and oriented.  Pt feet elevated.  EMS arrived.  Pt stated he felt okay to sit up, pt assisted to sit up, pt started to become diaphoretic and unresponsive.  Pt lifted to EMS stretcher and attached to their monitor. Pt to be transported to [facility] ED and [facility] Card Master Contacted.

## 2014-08-10 NOTE — ED Notes (Signed)
NOTIFIED DR Wyvonnia Dusky IN PERSON FOR PATIENTS LAB RESULTSOF CG4 LACTIC ACID @16 :20.PM

## 2014-08-10 NOTE — ED Notes (Signed)
Pt monitored by pulse ox, bp cuff, and 12-lead. 

## 2014-08-10 NOTE — ED Notes (Signed)
Lab results reported to [place].

## 2014-08-10 NOTE — ED Notes (Signed)
Pt given urinal.

## 2014-08-10 NOTE — ED Notes (Signed)
[name]-- Daughter-- [phone] at bedside--- given wallet, phone, and keys.

## 2014-08-10 NOTE — H&P (Signed)
History and Physical  Jonathon [name] QQI:[id number] DOB: Jun 06, 1935 DOA: 08/10/2014  Referring physician: EDP PCP: Nyoka Cowden, MD   Chief Complaint:   HPI: Jonathon [name] is a 79 y.o. male  Very pleasant AAM sent to [place] ER by EMs due to syncope/LOC?/bladder incontinence episode after his cardiology office visit. there is also reported generalized shakiness?seizure activity? By EMS, concern of STEMI, per ED physician, EKG was reviewed with Dr. Burt Knack, STEMI was ruled out. However, patient was found to be significantly orthostatic hypotension, with elevation of lactic acid. CTA no PE/ Ct head no acute findings/ due to reported blood in stool a CT ab was done, no acute findings. He received ns bolus, hospitalist paged for admission.  Patient reported progressive weakness, loss of appetite, profuse night sweat in the last 12month, though no reported significant weightloss, patient denies  Fever, no cough, no dysuria, no diarrhea, no n/v.   Review chart he was recently started on flecainide for svt about a week ago, he has h/o bradycardia s/p PPM. 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   Review of Systems:  Detail per HPI, Review of systems are otherwise negative  Past Medical History  Diagnosis Date  . BENIGN PROSTATIC HYPERTROPHY, HX OF 10/25/2008  . BRADYCARDIA 10/25/2008  . HYPERTENSION 11/21/2006  . 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 PACEMAKER, PERMANENT 10/25/2008  . TOBACCO ABUSE 10/25/2008    Quit 2012  . Iron deficiency anemia, unspecified 04/19/2013  . Asthma    Past Surgical History  Procedure Laterality Date  . Pacemaker insertion  2005  . Colonoscopy  [id number] . Skin graft Right 1962    wrist  . Permanent pacemaker generator change N/A 06/19/2012    Procedure: PERMANENT PACEMAKER  GENERATOR CHANGE;  Surgeon: GEvans Lance MD;  [facility];  Service: Cardiovascular;  Laterality: N/A;   Social History:  reports that he quit smoking about 4 years ago. His smoking use included Cigarettes. He started smoking about 49 years ago. He has a 11.5 pack-year smoking history. He has never used smokeless tobacco. He reports that he drinks alcohol. He reports that he does not use illicit drugs. Patient lives at home & is able to participate in activities of daily living without assistant, though reported progressive weakness, has to quit working about 651monthago. Patient has good family support.  No Known Allergies  Family History  Problem Relation Age of Onset  . Breast cancer Sister     Living, 7034. Diabetes type II Brother     Living, 6749. Breast cancer Sister     Living, 6499. Heart attack Father     Died, 6552. Kidney disease Father   . Parkinson's disease Mother     Died, 8641. Diabetes Daughter     Living, 5136. Diabetes Son     Living, 5040. Colon cancer Neg Hx   . Rectal cancer Neg Hx   . Stomach cancer Neg Hx   . Ovarian cancer Daughter       Prior to Admission medications   Medication Sig Start Date End Date Taking? Authorizing Provider  Cyanocobalamin (VITAMIN B 12 PO) Take 1,000 mg by mouth daily.   Yes Historical Provider, MD  diltiazem (CARDIZEM LA) 240 MG 24 hr tablet Take 1 tablet (240 mg total) by mouth daily. 01/14/13  Yes Marletta Lor, MD  flecainide (TAMBOCOR) 150 MG tablet Take 0.5 tablets (75 mg total) by mouth 2 (two) times daily. 07/27/14  Yes [name], MD  Garlic 1610 MG CAPS Take 2,000 mg by mouth daily at 12 noon.   Yes Historical Provider, MD  Multiple Vitamins-Minerals (CENTRUM ADULTS PO) Take by mouth.   Yes Historical Provider, MD  naproxen sodium (ANAPROX) 220 MG tablet Take 220 mg by mouth daily as needed (pain).   Yes Historical Provider, MD  Omega-3 Fatty Acids (FISH OIL) 1000 MG CAPS Take by mouth every  morning.   Yes Historical Provider, MD  tamsulosin (FLOMAX) 0.4 MG CAPS capsule TAKE 1 CAPSULE BY MOUTH DAILY 02/08/14  Yes Marletta Lor, MD  testosterone (ANDROGEL) 50 MG/5GM (1%) GEL APPLY 1 PACKET ONTO THE SKIN ONCE DAILY AS DIRECTED 05/24/14  Yes Volanda Napoleon, MD    Physical Exam: BP 150/75 mmHg  Pulse 121  Temp(Src) 99.1 F (37.3 C) (Rectal)  Resp 20  Ht _0  (1.778 m)  Wt 82.555 kg (182 lb)  BMI 26.11 kg/m2  SpO2 94%  General:  AAox3, NAD Eyes: PERRL ENT: unremarkable Neck: supple, no JVD Cardiovascular: sinus tachycardia Respiratory: CTABL Abdomen: soft/ND/ND, positive bowel sounds Skin: no rash Musculoskeletal:  No edema, muscle atrophy? Psychiatric: calm/cooperative Neurologic: no focal deficit, but does has symmetrical weakness in lower extremity, ? intentional tremor upper extremity?          Labs on Admission:  Basic Metabolic Panel:  Recent Labs Lab 08/10/14 1227 08/10/14 1232 08/10/14 1432  NA 140 139  --   K 5.3* 5.4* 4.7  CL 104 104  --   CO2 22  --   --   GLUCOSE 105* 107*  --   BUN 12 16  --   CREATININE 1.22 1.40*  --   CALCIUM 8.7  --   --    Liver Function Tests:  Recent Labs Lab 08/10/14 1227  AST 147*  ALT 72*  ALKPHOS 103  BILITOT 1.5*  PROT 6.7  ALBUMIN 3.1*   No results for input(s): LIPASE, AMYLASE in the last 168 hours. No results for input(s): AMMONIA in the last 168 hours. CBC:  Recent Labs Lab 08/10/14 1227 08/10/14 1232  WBC 4.4  --   NEUTROABS 2.0  --   HGB 12.0* 13.6  HCT 35.5* 40.0  MCV 86.0  --   PLT 136*  --    Cardiac Enzymes:  Recent Labs Lab 08/10/14 1227  TROPONINI <0.03    BNP (last 3 results)  Recent Labs  08/10/14 1227  BNP 36.0    ProBNP (last 3 results) No results for input(s): PROBNP in the last 8760 hours.  CBG:  Recent Labs Lab 08/10/14 1256  GLUCAP 95    Radiological Exams on Admission: Ct Head Wo Contrast  08/10/2014   CLINICAL DATA:  Collapse with loss  of consciousness.  EXAM: CT HEAD WITHOUT CONTRAST  TECHNIQUE: Contiguous axial images were obtained from the base of the skull through the vertex without intravenous contrast.  COMPARISON:  None.  FINDINGS: Mild atrophy. Mild chronic microvascular ischemic change in the white matter.  Negative for acute infarct, hemorrhage, mass lesion.  Calvarium is intact.  Chronic right medial orbital fracture.  IMPRESSION: Atrophy and chronic microvascular ischemia.  No acute abnormality.   Electronically Signed   By: Juanda Crumble  Carlis Abbott M.D.   On: 08/10/2014 14:18   Ct Angio Chest Pe W/cm &/or Wo Cm  08/10/2014   CLINICAL DATA:  Syncope  EXAM: CT ANGIOGRAPHY CHEST WITH CONTRAST  TECHNIQUE: Multidetector CT imaging of the chest was performed using the standard protocol during bolus administration of intravenous contrast. Multiplanar CT image reconstructions and MIPs were obtained to evaluate the vascular anatomy.  CONTRAST:  22m OMNIPAQUE IOHEXOL 350 MG/ML SOLN  COMPARISON:  None.  FINDINGS: There are no filling defects in the pulmonary arterial tree to suggest acute pulmonary thromboembolism.  No abnormal mediastinal adenopathy. Dual lead pacemaker device is in place.  No pericardial effusion.  No pleural effusion.  No pneumothorax.  Dependent atelectasis in the lungs.  No mass or consolidation.  No vertebral compression deformity.  Bilateral gynecomastia.  Review of the MIP images confirms the above findings.  IMPRESSION: No evidence of acute pulmonary thromboembolism.   Electronically Signed   By: AMarybelle KillingsM.D.   On: 08/10/2014 14:25   Dg Chest Portable 1 View  08/10/2014   CLINICAL DATA:  Loss of consciousness.  EXAM: PORTABLE CHEST - 1 VIEW  COMPARISON:  09/23/2013  FINDINGS: Dual-chamber pacer leads from the left are in unremarkable position. There is no cardiomegaly. Stable mild aortic tortuosity. There is no edema, consolidation, effusion, or pneumothorax.  IMPRESSION: Stable exam.   No active disease.    Electronically Signed   By: JMonte FantasiaM.D.   On: 08/10/2014 13:19    EKG: Independently reviewed. Sinus tachycardia/rbbb, chronic st elevation v1?  Assessment/Plan Present on Admission:  . LOC (loss of consciousness) . Syncope  Syncope with significant orthostatic hypotension. Will check tsh/am cortisol level. Admit to step down unit. ua no sign of infection, but is very concentrated, continue hydration. Neurology consulted due to ? Seizure activity, though no post ictal state observed. Echo cardiogram ordered. Cardiology consulted as well. Currently sinus tachycardia, will continue ccb, will have cardiology to decide on flecainide.   Lactic acidosis,  no sign of infection, from poor perfusion/shakiness? Blood culture done, continue ivf, trend lactic acid.  Progressive weakness/night sweat: will repeat spep/upep, check esr/crp/ck. Will need fall precaution/pt/discharge planning once blood pressure improves, for now bed rest.  DVT prophylaxis with lovenox.  Consultants:  Cardiology neurology  Code Status: full  Family Communication:  Patient and family  Disposition Plan: admit to stepdown  Time spent: >766ms  Elza Sortor MD, PhD [facility] Pager [phone] 7PM-7AM, please contact night-coverage at [email or web address], password [email or web address]

## 2014-08-10 NOTE — ED Notes (Signed)
Dr Wyvonnia Dusky made aware of orthostatics

## 2014-08-10 NOTE — ED Provider Notes (Signed)
CSN: [id number]     Arrival date & time 08/10/14  1215 History   First MD Initiated Contact with Patient 08/10/14 1228     Chief Complaint  Patient presents with  . Loss of Consciousness     (Consider location/radiation/quality/duration/timing/severity/associated sxs/prior Treatment) HPI Comments: Patient from cardiologist's office with episode of syncope and loss of consciousness. He was there for blood pressure check. He felt well but on his way out he felt generally weak, lightheaded and dizzy and collapsed into a chair. EMS was called. EKG was concerning for STEMI. Patient transported to the ED. He endorses generalized weakness but denies any chest pain. He denies any shortness of breath. He feels weak all over. EMS reports he lost consciousness 2 with questionable seizure activity. He is incontinent of urine. He denies any  focal weakness. No nausea or vomiting. No history of diabetes. He has a pacemaker secondary to irregular heart rate. Denies ever having a heart attack.   The history is provided by the patient and the EMS personnel. The history is limited by the condition of the patient.    Past Medical History  Diagnosis Date  . BENIGN PROSTATIC HYPERTROPHY, HX OF 10/25/2008  . BRADYCARDIA 10/25/2008  . HYPERTENSION 11/21/2006  . 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 PACEMAKER, PERMANENT 10/25/2008  . TOBACCO ABUSE 10/25/2008    Quit 2012  . Iron deficiency anemia, unspecified 04/19/2013  . Asthma    Past Surgical History  Procedure Laterality Date  . Pacemaker insertion  2005  . Colonoscopy  [id number]  . Skin graft Right 1962    wrist  . Permanent pacemaker generator change N/A 06/19/2012    Procedure: PERMANENT PACEMAKER GENERATOR CHANGE;  Surgeon: [name], MD;  Location: [facility] CATH LAB;  Service: Cardiovascular;  Laterality: N/A;   Family History  Problem Relation Age of Onset  . Breast cancer Sister     Living, 40  . Diabetes type II  Brother     Living, 73  . Breast cancer Sister     Living, 62  . Heart attack Father     Died, 69  . Kidney disease Father   . Parkinson's disease Mother     Died, 45  . Diabetes Daughter     Living, 13  . Diabetes Son     Living, 46  . Colon cancer Neg Hx   . Rectal cancer Neg Hx   . Stomach cancer Neg Hx   . Ovarian cancer Daughter    History  Substance Use Topics  . Smoking status: Former Smoker -- 0.25 packs/day for 46 years    Types: Cigarettes    Start date: 03/16/1965    Quit date: 06/15/2010  . Smokeless tobacco: Never Used     Comment: quit 3 years ago  . Alcohol Use: 0.0 oz/week    0 Standard drinks or equivalent per week     Comment: 4 beers per day    Review of Systems  Constitutional: Positive for fatigue. Negative for fever.  HENT: Negative for congestion and rhinorrhea.   Respiratory: Negative for cough, chest tightness and shortness of breath.   Cardiovascular: Positive for syncope. Negative for chest pain.  Gastrointestinal: Negative for nausea, vomiting and abdominal pain.  Genitourinary: Negative for dysuria, hematuria and testicular pain.  Musculoskeletal: Negative for myalgias and arthralgias.  Skin: Negative for rash.  Neurological: Positive for weakness. Negative for light-headedness and headaches.  A complete 10 system review of systems was  obtained and all systems are negative except as noted in the HPI and PMH.      Allergies  Review of patient's allergies indicates no known allergies.  Home Medications   Prior to Admission medications   Medication Sig Start Date End Date Taking? Authorizing Provider  Cyanocobalamin (VITAMIN B 12 PO) Take 1,000 mg by mouth daily.   Yes Historical Provider, MD  diltiazem (CARDIZEM LA) 240 MG 24 hr tablet Take 1 tablet (240 mg total) by mouth daily. 01/14/13  Yes Marletta Lor, MD  flecainide (TAMBOCOR) 150 MG tablet Take 0.5 tablets (75 mg total) by mouth 2 (two) times daily. 07/27/14  Yes [name], MD  Garlic 7353 MG CAPS Take 2,000 mg by mouth daily at 12 noon.   Yes Historical Provider, MD  Multiple Vitamins-Minerals (CENTRUM ADULTS PO) Take by mouth.   Yes Historical Provider, MD  naproxen sodium (ANAPROX) 220 MG tablet Take 220 mg by mouth daily as needed (pain).   Yes Historical Provider, MD  Omega-3 Fatty Acids (FISH OIL) 1000 MG CAPS Take by mouth every morning.   Yes Historical Provider, MD  tamsulosin (FLOMAX) 0.4 MG CAPS capsule TAKE 1 CAPSULE BY MOUTH DAILY 02/08/14  Yes Marletta Lor, MD  testosterone (ANDROGEL) 50 MG/5GM (1%) GEL APPLY 1 PACKET ONTO THE SKIN ONCE DAILY AS DIRECTED 05/24/14  Yes Volanda Napoleon, MD   BP 150/75 mmHg  Pulse 121  Temp(Src) 97.7 F (36.5 C) (Oral)  Resp 20  Ht 5\' 10"  (1.778 m)  Wt 178 lb 12.7 oz (81.1 kg)  BMI 25.65 kg/m2  SpO2 94% Physical Exam  Constitutional: He is oriented to person, place, and time. He appears well-developed and well-nourished. No distress.  HENT:  Head: Normocephalic and atraumatic.  Mouth/Throat: Oropharynx is clear and moist. No oropharyngeal exudate.  Eyes: Conjunctivae and EOM are normal. Pupils are equal, round, and reactive to light.  Neck: Normal range of motion. Neck supple.  No meningismus.  Cardiovascular: Normal rate, normal heart sounds and intact distal pulses.   No murmur heard. tachycardia  Pulmonary/Chest: Effort normal and breath sounds normal. No respiratory distress.  Abdominal: Soft. There is no tenderness. There is no rebound and no guarding.  Genitourinary:  Incontinent of urine  Musculoskeletal: Normal range of motion. He exhibits no edema or tenderness.  Neurological: He is alert and oriented to person, place, and time. No cranial nerve deficit. He exhibits normal muscle tone. Coordination normal.  CN 2-12 intact. 5/5 strength throughout  Skin: Skin is warm.  Psychiatric: He has a normal mood and affect. His behavior is normal.  Nursing note and vitals reviewed.   ED  Course  Procedures (including critical care time) Labs Review Labs Reviewed  CBC WITH DIFFERENTIAL/PLATELET - Abnormal; Notable for the following:    RBC 4.13 (*)    Hemoglobin 12.0 (*)    HCT 35.5 (*)    Platelets 136 (*)    Monocytes Relative 13 (*)    All other components within normal limits  COMPREHENSIVE METABOLIC PANEL - Abnormal; Notable for the following:    Potassium 5.3 (*)    Glucose, Bld 105 (*)    Albumin 3.1 (*)    AST 147 (*)    ALT 72 (*)    Total Bilirubin 1.5 (*)    GFR calc non Af Amer 56 (*)    GFR calc Af Amer 65 (*)    All other components within normal limits  URINALYSIS, ROUTINE W REFLEX  MICROSCOPIC - Abnormal; Notable for the following:    Color, Urine AMBER (*)    Specific Gravity, Urine >1.046 (*)    Ketones, ur 15 (*)    All other components within normal limits  I-STAT CG4 LACTIC ACID, ED - Abnormal; Notable for the following:    Lactic Acid, Venous 4.51 (*)    All other components within normal limits  I-STAT CHEM 8, ED - Abnormal; Notable for the following:    Potassium 5.4 (*)    Creatinine, Ser 1.40 (*)    Glucose, Bld 107 (*)    Calcium, Ion 1.01 (*)    All other components within normal limits  I-STAT CG4 LACTIC ACID, ED - Abnormal; Notable for the following:    Lactic Acid, Venous 4.95 (*)    All other components within normal limits  I-STAT ARTERIAL BLOOD GAS, ED - Abnormal; Notable for the following:    pCO2 arterial 28.5 (*)    pO2, Arterial 122.0 (*)    Bicarbonate 16.2 (*)    Acid-base deficit 8.0 (*)    All other components within normal limits  CULTURE, BLOOD (ROUTINE X 2)  CULTURE, BLOOD (ROUTINE X 2)  URINE CULTURE  MRSA PCR SCREENING  TROPONIN I  BRAIN NATRIURETIC PEPTIDE  PROTIME-INR  POTASSIUM  SEDIMENTATION RATE  C-REACTIVE PROTEIN  TSH  CK  CBC  CREATININE, SERUM  COMPREHENSIVE METABOLIC PANEL  CBC  CORTISOL  PROTEIN ELECTROPHORESIS, SERUM  IMMUNOFIXATION ELECTROPHORESIS, URINE (WITH TOT PROT)  OCCULT  BLOOD X 1 CARD TO LAB, STOOL  I-STAT TROPOININ, ED  CBG MONITORING, ED    Imaging Review Ct Abdomen Pelvis Wo Contrast  08/10/2014   CLINICAL DATA:  weakness. States he has blood in his stool for a month. Pt. Also thought to had seizure today  EXAM: CT ABDOMEN AND PELVIS WITHOUT CONTRAST  TECHNIQUE: Multidetector CT imaging of the abdomen and pelvis was performed following the standard protocol without IV contrast.  COMPARISON:  08/17/2013 and earlier studies  FINDINGS: Minimal dependent atelectasis in the visualized lung bases right greater than left. Transvenous pacing leads are partially seen.  Fatty liver. Unremarkable spleen. Mild bilateral adrenal hyperplasia. Stable 5 mm cystic lesion in the pancreatic body image 24/2. Stable 2.2 cm right parapelvic renal cyst. No hydronephrosis. Residual contrast material in the renal collecting systems and urinary bladder from previous CT contrast administration. Stable right bladder diverticulum.  Stomach, small bowel, and colon are nondilated. Normal appendix. Scattered descending and proximal sigmoid colon diverticula without adjacent inflammatory/edematous change. Moderate enlargement of the prostate. No free air. No ascites. Mild spondylitic changes in the lower lumbar spine. No adenopathy localized.  IMPRESSION: 1. Descending and sigmoid diverticulosis without CT evidence of diverticulitis. 2. No evidence of mass or adenopathy. 3. Fatty liver 4. Stable right renal cyst  and small cystic pancreatic body lesion.   Electronically Signed   By: Lucrezia Europe M.D.   On: 08/10/2014 17:42   Ct Head Wo Contrast  08/10/2014   CLINICAL DATA:  Collapse with loss of consciousness.  EXAM: CT HEAD WITHOUT CONTRAST  TECHNIQUE: Contiguous axial images were obtained from the base of the skull through the vertex without intravenous contrast.  COMPARISON:  None.  FINDINGS: Mild atrophy. Mild chronic microvascular ischemic change in the white matter.  Negative for acute infarct,  hemorrhage, mass lesion.  Calvarium is intact.  Chronic right medial orbital fracture.  IMPRESSION: Atrophy and chronic microvascular ischemia.  No acute abnormality.   Electronically Signed   By:  Franchot Gallo M.D.   On: 08/10/2014 14:18   Ct Angio Chest Pe W/cm &/or Wo Cm  08/10/2014   CLINICAL DATA:  Syncope  EXAM: CT ANGIOGRAPHY CHEST WITH CONTRAST  TECHNIQUE: Multidetector CT imaging of the chest was performed using the standard protocol during bolus administration of intravenous contrast. Multiplanar CT image reconstructions and MIPs were obtained to evaluate the vascular anatomy.  CONTRAST:  40mL OMNIPAQUE IOHEXOL 350 MG/ML SOLN  COMPARISON:  None.  FINDINGS: There are no filling defects in the pulmonary arterial tree to suggest acute pulmonary thromboembolism.  No abnormal mediastinal adenopathy. Dual lead pacemaker device is in place.  No pericardial effusion.  No pleural effusion.  No pneumothorax.  Dependent atelectasis in the lungs.  No mass or consolidation.  No vertebral compression deformity.  Bilateral gynecomastia.  Review of the MIP images confirms the above findings.  IMPRESSION: No evidence of acute pulmonary thromboembolism.   Electronically Signed   By: Marybelle Killings M.D.   On: 08/10/2014 14:25   Dg Chest Portable 1 View  08/10/2014   CLINICAL DATA:  Loss of consciousness.  EXAM: PORTABLE CHEST - 1 VIEW  COMPARISON:  09/23/2013  FINDINGS: Dual-chamber pacer leads from the left are in unremarkable position. There is no cardiomegaly. Stable mild aortic tortuosity. There is no edema, consolidation, effusion, or pneumothorax.  IMPRESSION: Stable exam.   No active disease.   Electronically Signed   By: Monte Fantasia M.D.   On: 08/10/2014 13:19     EKG Interpretation   Date/Time:  Wednesday August 10 2014 12:20:49 EDT Ventricular Rate:  82 PR Interval:  230 QRS Duration: 157 QT Interval:  416 QTC Calculation: 486 R Axis:   58 Text Interpretation:  Sinus rhythm Prolonged PR  interval Right bundle  branch block Artifact in lead(s) III No significant change was found  Confirmed by [place] [phone]) on 08/10/2014 12:34:56 PM      MDM   Final diagnoses:  LOC (loss of consciousness)  Syncope, unspecified syncope type   Generalized weakness with syncopal episode. No chest pain or shortness of breath. Incontinent of urine. Patient awake and alert on arrival. Denies pain. Endorses global weakness.  EKG reviewed with Dr. Burt Knack who agrees it does not meet STEMI criteria and code STEMI cancelled. Patient with no chest pain or shortness of breath.  syncope versus seizure versus arrhythmia. Concern for PE as well. No right heart strain seen on bedside ultrasound. Elevated lactate, tachycardia. No fever or infectious symptoms.  IVF continued, culture obtained.  CT head negative. No PE.  No AAA or other intraabominal pathology. Hypotensive with attempted standing.  Significantly orthostatic. BP stable at rest.   Admission d.w Dr. Erlinda Hong to stepdown.   CRITICAL CARE Performed by: Ezequiel Essex Total critical care time: 45 Critical care time was exclusive of separately billable procedures and treating other patients. Critical care was necessary to treat or prevent imminent or life-threatening deterioration. Critical care was time spent personally by me on the following activities: development of treatment plan with patient and/or surrogate as well as nursing, discussions with consultants, evaluation of patient's response to treatment, examination of patient, obtaining history from patient or surrogate, ordering and performing treatments and interventions, ordering and review of laboratory studies, ordering and review of radiographic studies, pulse oximetry and re-evaluation of patient's condition.   Ezequiel Essex, MD 08/10/14 [phone]

## 2014-08-10 NOTE — Patient Instructions (Signed)
Your physician recommends that you continue on your current medications as directed. Please refer to the Current Medication list given to you today.  Your physician has requested that you have an exercise tolerance test. For further information please visit [email or web address]. Please also follow instruction sheet, as given.  Keep follow up appointment with Dr. Lovena Le on 4/29.

## 2014-08-11 ENCOUNTER — Inpatient Hospital Stay (HOSPITAL_COMMUNITY): Payer: Medicare Other

## 2014-08-11 ENCOUNTER — Encounter (HOSPITAL_COMMUNITY): Payer: Self-pay | Admitting: Physician Assistant

## 2014-08-11 DIAGNOSIS — R001 Bradycardia, unspecified: Secondary | ICD-10-CM

## 2014-08-11 DIAGNOSIS — R945 Abnormal results of liver function studies: Secondary | ICD-10-CM

## 2014-08-11 DIAGNOSIS — R7989 Other specified abnormal findings of blood chemistry: Secondary | ICD-10-CM

## 2014-08-11 DIAGNOSIS — R404 Transient alteration of awareness: Secondary | ICD-10-CM

## 2014-08-11 LAB — URINE CULTURE
COLONY COUNT: NO GROWTH
Culture: NO GROWTH

## 2014-08-11 LAB — COMPREHENSIVE METABOLIC PANEL
ALBUMIN: 2.8 g/dL — AB (ref 3.5–5.2)
ALT: 62 U/L — AB (ref 0–53)
AST: 94 U/L — AB (ref 0–37)
Alkaline Phosphatase: 90 U/L (ref 39–117)
Anion gap: 12 (ref 5–15)
BUN: 13 mg/dL (ref 6–23)
CALCIUM: 8.2 mg/dL — AB (ref 8.4–10.5)
CO2: 23 mmol/L (ref 19–32)
CREATININE: 1.27 mg/dL (ref 0.50–1.35)
Chloride: 106 mmol/L (ref 96–112)
GFR, EST AFRICAN AMERICAN: 62 mL/min — AB (ref >90)
GFR, EST NON AFRICAN AMERICAN: 53 mL/min — AB (ref >90)
Glucose, Bld: 84 mg/dL (ref 70–99)
Potassium: 4.9 mmol/L (ref 3.5–5.1)
Sodium: 141 mmol/L (ref 135–145)
Total Bilirubin: 1.6 mg/dL — ABNORMAL HIGH (ref 0.3–1.2)
Total Protein: 6.1 g/dL (ref 6.0–8.3)

## 2014-08-11 LAB — CK: Total CK: 72 U/L (ref 7–232)

## 2014-08-11 LAB — CBC
HCT: 31.1 % — ABNORMAL LOW (ref 39.0–52.0)
Hemoglobin: 10.3 g/dL — ABNORMAL LOW (ref 13.0–17.0)
MCH: 28.5 pg (ref 26.0–34.0)
MCHC: 33.1 g/dL (ref 30.0–36.0)
MCV: 85.9 fL (ref 78.0–100.0)
Platelets: 104 10*3/uL — ABNORMAL LOW (ref 150–400)
RBC: 3.62 MIL/uL — ABNORMAL LOW (ref 4.22–5.81)
RDW: 14.5 % (ref 11.5–15.5)
WBC: 4.2 10*3/uL (ref 4.0–10.5)

## 2014-08-11 LAB — CORTISOL: CORTISOL PLASMA: 22.6 ug/dL

## 2014-08-11 LAB — LACTIC ACID, PLASMA: LACTIC ACID, VENOUS: 1.4 mmol/L (ref 0.5–2.0)

## 2014-08-11 LAB — C-REACTIVE PROTEIN

## 2014-08-11 MED ORDER — VANCOMYCIN HCL IN DEXTROSE 750-5 MG/150ML-% IV SOLN
750.0000 mg | Freq: Two times a day (BID) | INTRAVENOUS | Status: DC
  Administered 2014-08-11 – 2014-08-12 (×2): 750 mg via INTRAVENOUS
  Filled 2014-08-11 (×3): qty 150

## 2014-08-11 MED ORDER — ZOLPIDEM TARTRATE 5 MG PO TABS
5.0000 mg | ORAL_TABLET | Freq: Every evening | ORAL | Status: DC | PRN
  Administered 2014-08-11 – 2014-08-12 (×2): 5 mg via ORAL
  Filled 2014-08-11 (×2): qty 1

## 2014-08-11 MED ORDER — TRAZODONE HCL 50 MG PO TABS: 50.0000 mg | ORAL_TABLET | Freq: Every evening | ORAL | Status: DC | PRN

## 2014-08-11 MED ORDER — FLECAINIDE ACETATE 50 MG PO TABS
75.0000 mg | ORAL_TABLET | Freq: Two times a day (BID) | ORAL | Status: DC
  Filled 2014-08-11 (×2): qty 2

## 2014-08-11 MED ORDER — PYRIDOSTIGMINE BROMIDE 60 MG PO TABS: 60.0000 mg | ORAL_TABLET | Freq: Two times a day (BID) | ORAL | Status: DC

## 2014-08-11 MED ORDER — FLECAINIDE ACETATE 100 MG PO TABS
100.0000 mg | ORAL_TABLET | Freq: Two times a day (BID) | ORAL | Status: DC
  Administered 2014-08-11 – 2014-08-15 (×9): 100 mg via ORAL
  Filled 2014-08-11 (×10): qty 1

## 2014-08-11 MED ORDER — PYRIDOSTIGMINE BROMIDE 60 MG PO TABS
60.0000 mg | ORAL_TABLET | Freq: Two times a day (BID) | ORAL | Status: DC
  Administered 2014-08-11 – 2014-08-15 (×8): 60 mg via ORAL
  Filled 2014-08-11 (×14): qty 1

## 2014-08-11 NOTE — Procedures (Signed)
ELECTROENCEPHALOGRAM REPORT   Patient: Jonathon [name]       Room #: 2C-18 Age: 79 y.o.        Sex: male Referring Physician: Dr Karleen Hampshire Report Date:  08/11/2014        Interpreting Physician: Hulen Luster  History: Jonathon [name] is an 79 y.o. male presents with syncopal episode  Medications:  Scheduled: . diltiazem  240 mg Oral Daily  . enoxaparin (LOVENOX) injection  40 mg Subcutaneous Q24H  . flecainide  100 mg Oral BID  . pyridostigmine  60 mg Oral BID AC  . tamsulosin  0.4 mg Oral Daily    Conditions of Recording:  This is a 16 channel EEG carried out with the patient in the drowsy state.  Description:  The waking background activity consists of a low voltage, symmetrical, fairly well organized, 9-11Hz  alpha activity, seen from the parieto-occipital and posterior temporal regions.  Low voltage fast activity, poorly organized, is seen anteriorly and is at times superimposed on more posterior regions.  A mixture of theta and alpha rhythms are seen from the central and temporal regions. No focal slowing or epileptiform activity is noted.   The patient drowses with slowing to irregular, low voltage theta and beta activity. Normal sleep architecture is not noted.   Hyperventilation was not performed.  Intermittent photic stimulation was performed and elicits a symmetrical driving response but fails to elicit any abnormalities.  IMPRESSION: Normal electroencephalogram. There are no focal lateralizing or epileptiform features.   Jim Like, DO [facility] [phone]  If 7pm- 7am, please page neurology on call as listed in [place]. 08/11/2014, 11:49 AM

## 2014-08-11 NOTE — Plan of Care (Signed)
Problem: Phase I Progression Outcomes Goal: OOB as tolerated unless otherwise ordered Outcome: Progressing Pt up to chair today with 1-2 assist. Tolerated well.  Goal: Voiding-avoid urinary catheter unless indicated Outcome: Progressing Pt voids in BSC. No incontinence or difficulty voiding.  Goal: Hemodynamically stable Outcome: Progressing VSS. Orthostatic vitals improved.   Problem: Phase II Progression Outcomes Goal: IV changed to normal saline lock Outcome: Not Progressing Pt continues to have normal saline infusing, but rate decreased to half today.

## 2014-08-11 NOTE — Progress Notes (Addendum)
Subjective: Patient without further events overnight.  No new complaints this morning.    Objective: Current vital signs: BP 179/101 mmHg  Pulse 94  Temp(Src) 98.7 F (37.1 C) (Oral)  Resp 25  Ht 5\' 10"  (1.778 m)  Wt 81.1 kg (178 lb 12.7 oz)  BMI 25.65 kg/m2  SpO2 97% Vital signs in last 24 hours: Temp:  [97.7 F (36.5 C)-100 F (37.8 C)] 98.7 F (37.1 C) (03/31 0826) Pulse Rate:  [80-122] 94 (03/31 0826) Resp:  [18-27] 25 (03/31 0826) BP: (92-179)/(64-101) 179/101 mmHg (03/31 0826) SpO2:  [94 %-100 %] 97 % (03/31 0826) Weight:  [81.1 kg (178 lb 12.7 oz)-82.736 kg (182 lb 6.4 oz)] 81.1 kg (178 lb 12.7 oz) (03/30 1700)  Intake/Output from previous day: 03/30 0701 - 03/31 0700 In: 1006.7 [I.V.:1006.7] Out: -  Intake/Output this shift: Total I/O In: 340 [P.O.:240; I.V.:100] Out: 400 [Urine:400] Nutritional status: Diet heart healthy/carb modified Room service appropriate?: Yes; Fluid consistency:: Thin  Neurologic Exam: Mental Status: Alert, oriented, thought content appropriate. Speech fluent without evidence of aphasia. Able to follow 3 step commands without difficulty. Cranial Nerves: II: Discs flat bilaterally; Visual fields grossly normal, pupils equal, round, reactive to light and accommodation III,IV, VI: ptosis not present, extra-ocular motions intact bilaterally V,VII: smile symmetric, facial light touch sensation normal bilaterally VIII: hearing normal bilaterally IX,X: gag reflex present XI: bilateral shoulder shrug XII: midline tongue extension Motor: Right :Upper extremity 5/5Left: Upper extremity 5/5 Lower extremity 5/5Lower extremity 5/5 Tremor noted throughout in face, arms and legs. Worse with movement. Increased tone on the right.  Sensory: Pinprick and light touch intact throughout, bilaterally Deep Tendon Reflexes: 2+ and symmetric  with absent AJ's bilaterally Plantars: Right: downgoingLeft: downgoing Cerebellar: normal finger-to-nose and normal heel-to-shin testing with tremor but no dysmetria  Lab Results: Basic Metabolic Panel:  Recent Labs Lab 08/10/14 1227 08/10/14 1232 08/10/14 1432 08/10/14 1948 08/11/14 0305  NA 140 139  --   --  141  K 5.3* 5.4* 4.7  --  4.9  CL 104 104  --   --  106  CO2 22  --   --   --  23  GLUCOSE 105* 107*  --   --  84  BUN 12 16  --   --  13  CREATININE 1.22 1.40*  --  1.14 1.27  CALCIUM 8.7  --   --   --  8.2*    Liver Function Tests:  Recent Labs Lab 08/10/14 1227 08/11/14 0305  AST 147* 94*  ALT 72* 62*  ALKPHOS 103 90  BILITOT 1.5* 1.6*  PROT 6.7 6.1  ALBUMIN 3.1* 2.8*   No results for input(s): LIPASE, AMYLASE in the last 168 hours. No results for input(s): AMMONIA in the last 168 hours.  CBC:  Recent Labs Lab 08/10/14 1227 08/10/14 1232 08/10/14 1948 08/11/14 0305  WBC 4.4  --  5.0 4.2  NEUTROABS 2.0  --   --   --   HGB 12.0* 13.6 11.4* 10.3*  HCT 35.5* 40.0 33.2* 31.1*  MCV 86.0  --  85.3 85.9  PLT 136*  --  117* 104*    Cardiac Enzymes:  Recent Labs Lab 08/10/14 1227 08/11/14 0305  CKTOTAL  --  72  TROPONINI <0.03  --     Lipid Panel: No results for input(s): CHOL, TRIG, HDL, CHOLHDL, VLDL, LDLCALC in the last 168 hours.  CBG:  Recent Labs Lab 08/10/14 [place]    Microbiology: Results  for orders placed or performed during the hospital encounter of 08/10/14  MRSA PCR Screening     Status: None   Collection Time: 08/10/14  6:15 PM  Result Value Ref Range Status   MRSA by PCR NEGATIVE NEGATIVE Final    Comment:        The GeneXpert MRSA Assay (FDA approved for NASAL specimens only), is one component of a comprehensive MRSA colonization surveillance program. It is not intended to diagnose MRSA infection nor to guide or monitor treatment for MRSA infections.   Blood culture  (routine x 2)     Status: None (Preliminary result)   Collection Time: 08/10/14  7:40 PM  Result Value Ref Range Status   Specimen Description BLOOD RIGHT HAND  Final   Special Requests BOTTLES DRAWN AEROBIC ONLY 4CC  Final   Culture PENDING  Incomplete   Report Status PENDING  Incomplete  Blood culture (routine x 2)     Status: None (Preliminary result)   Collection Time: 08/10/14  7:48 PM  Result Value Ref Range Status   Specimen Description BLOOD LEFT HAND  Final   Special Requests BOTTLES DRAWN AEROBIC AND ANAEROBIC 5CC  Final   Culture PENDING  Incomplete   Report Status PENDING  Incomplete    Coagulation Studies:  Recent Labs  08/10/14 1227  LABPROT 13.7  INR 1.04    Imaging: Ct Abdomen Pelvis Wo Contrast  08/10/2014   CLINICAL DATA:  weakness. States he has blood in his stool for a month. Pt. Also thought to had seizure today  EXAM: CT ABDOMEN AND PELVIS WITHOUT CONTRAST  TECHNIQUE: Multidetector CT imaging of the abdomen and pelvis was performed following the standard protocol without IV contrast.  COMPARISON:  08/17/2013 and earlier studies  FINDINGS: Minimal dependent atelectasis in the visualized lung bases right greater than left. Transvenous pacing leads are partially seen.  Fatty liver. Unremarkable spleen. Mild bilateral adrenal hyperplasia. Stable 5 mm cystic lesion in the pancreatic body image 24/2. Stable 2.2 cm right parapelvic renal cyst. No hydronephrosis. Residual contrast material in the renal collecting systems and urinary bladder from previous CT contrast administration. Stable right bladder diverticulum.  Stomach, small bowel, and colon are nondilated. Normal appendix. Scattered descending and proximal sigmoid colon diverticula without adjacent inflammatory/edematous change. Moderate enlargement of the prostate. No free air. No ascites. Mild spondylitic changes in the lower lumbar spine. No adenopathy localized.  IMPRESSION: 1. Descending and sigmoid  diverticulosis without CT evidence of diverticulitis. 2. No evidence of mass or adenopathy. 3. Fatty liver 4. Stable right renal cyst  and small cystic pancreatic body lesion.   Electronically Signed   By: Lucrezia Europe M.D.   On: 08/10/2014 17:42   Ct Head Wo Contrast  08/10/2014   CLINICAL DATA:  Collapse with loss of consciousness.  EXAM: CT HEAD WITHOUT CONTRAST  TECHNIQUE: Contiguous axial images were obtained from the base of the skull through the vertex without intravenous contrast.  COMPARISON:  None.  FINDINGS: Mild atrophy. Mild chronic microvascular ischemic change in the white matter.  Negative for acute infarct, hemorrhage, mass lesion.  Calvarium is intact.  Chronic right medial orbital fracture.  IMPRESSION: Atrophy and chronic microvascular ischemia.  No acute abnormality.   Electronically Signed   By: Franchot Gallo M.D.   On: 08/10/2014 14:18   Ct Angio Chest Pe W/cm &/or Wo Cm  08/10/2014   CLINICAL DATA:  Syncope  EXAM: CT ANGIOGRAPHY CHEST WITH CONTRAST  TECHNIQUE: Multidetector CT imaging of the  chest was performed using the standard protocol during bolus administration of intravenous contrast. Multiplanar CT image reconstructions and MIPs were obtained to evaluate the vascular anatomy.  CONTRAST:  54mL OMNIPAQUE IOHEXOL 350 MG/ML SOLN  COMPARISON:  None.  FINDINGS: There are no filling defects in the pulmonary arterial tree to suggest acute pulmonary thromboembolism.  No abnormal mediastinal adenopathy. Dual lead pacemaker device is in place.  No pericardial effusion.  No pleural effusion.  No pneumothorax.  Dependent atelectasis in the lungs.  No mass or consolidation.  No vertebral compression deformity.  Bilateral gynecomastia.  Review of the MIP images confirms the above findings.  IMPRESSION: No evidence of acute pulmonary thromboembolism.   Electronically Signed   By: Marybelle Killings M.D.   On: 08/10/2014 14:25   Dg Chest Portable 1 View  08/10/2014   CLINICAL DATA:  Loss of  consciousness.  EXAM: PORTABLE CHEST - 1 VIEW  COMPARISON:  09/23/2013  FINDINGS: Dual-chamber pacer leads from the left are in unremarkable position. There is no cardiomegaly. Stable mild aortic tortuosity. There is no edema, consolidation, effusion, or pneumothorax.  IMPRESSION: Stable exam.   No active disease.   Electronically Signed   By: Monte Fantasia M.D.   On: 08/10/2014 13:19    Medications:  I have reviewed the patient's current medications. Scheduled: . diltiazem  240 mg Oral Daily  . enoxaparin (LOVENOX) injection  40 mg Subcutaneous Q24H  . flecainide  100 mg Oral BID  . tamsulosin  0.4 mg Oral Daily    Assessment/Plan: Patient with no further events.  Last documentation of orthostatics show BP drop to be significant.  EEG pending.  Recommendations: 1.  Will follow up results of EEG.  Doubt seizure 2.  There is no contraindication to the use of Mestinon.  Case discussed with Dr. Caryl Comes   LOS: 1 day   Alexis Goodell, MD [facility] [phone] 08/11/2014  9:48 AM

## 2014-08-11 NOTE — Progress Notes (Signed)
[facility] PROGRESS NOTE  Jonathon [name] BMW:[id number] DOB: 23-Jul-1935 DOA: 08/10/2014 PCP: Nyoka Cowden, MD Interim summary: 29 y old male admitted for syncope.  Assessment/Plan: 1. 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  Am cortisol level is 22.6. He was initially admitted to step down and started on IV fluids.  Cardiology and neurology consulted and recommendations given.  EEG ordered and was found to be negative for epileptiform activity.  He was started on mestinon.    Bradycardia and s/p PPM : interrogated and did not show any significant cardiac arrhythmias .  Resume flecainide. Echocardiogram ordered and  Is pending.    Hypertension: bp improving.    BPH: Resume flomax.    elevated liver function tests: Asymptomatic. US abdomen ordered.    Mild anemia and thrombocytopenia: Unclear etiology.  Get anemia panel and smear.      Code Status: full code.  Family Communication: family at bedside Disposition Plan: pending. Remain in step down.    Consultants:  [place]  [facility].   Procedures:  EEG  Antibiotics:  none  HPI/Subjective: No new complaints. No dizziness, ches tpain or sob.   Objective: Filed Vitals:   08/11/14 1601  BP: 177/103  Pulse: 111  Temp: 98.6 F (37 C)  Resp: 21    Intake/Output Summary (Last 24 hours) at 08/11/14 1653 Last data filed at 08/11/14 1500  Gross per 24 hour  Intake   1980 ml  Output    400 ml  Net   1580 ml   Filed Weights   08/10/14 1222 08/10/14 1700  Weight: 82.555 kg (182 lb) 81.1 kg (178 lb 12.7 oz)    Exam:   General:  Alert afebrile comfortable sitting in the chair  Cardiovascular: s1s2  Respiratory: clear to ausculatation, no wheezing or rhonchi  Abdomen: soft non tender non distended bowel sounds heard  Musculoskeletal: no pedal edema.   Data Reviewed: Basic Metabolic Panel:  Recent Labs Lab  08/10/14 1227 08/10/14 1232 08/10/14 1432 08/10/14 1948 08/11/14 0305  NA 140 139  --   --  141  K 5.3* 5.4* 4.7  --  4.9  CL 104 104  --   --  106  CO2 22  --   --   --  23  GLUCOSE 105* 107*  --   --  84  BUN 12 16  --   --  13  CREATININE 1.22 1.40*  --  1.14 1.27  CALCIUM 8.7  --   --   --  8.2*   Liver Function Tests:  Recent Labs Lab 08/10/14 1227 08/11/14 0305  AST 147* 94*  ALT 72* 62*  ALKPHOS 103 90  BILITOT 1.5* 1.6*  PROT 6.7 6.1  ALBUMIN 3.1* 2.8*   No results for input(s): LIPASE, AMYLASE in the last 168 hours. No results for input(s): AMMONIA in the last 168 hours. CBC:  Recent Labs Lab 08/10/14 1227 08/10/14 1232 08/10/14 1948 08/11/14 0305  WBC 4.4  --  5.0 4.2  NEUTROABS 2.0  --   --   --   HGB 12.0* 13.6 11.4* 10.3*  HCT 35.5* 40.0 33.2* 31.1*  MCV 86.0  --  85.3 85.9  PLT 136*  --  117* 104*   Cardiac Enzymes:  Recent Labs Lab 08/10/14 1227 08/11/14 0305  CKTOTAL  --  72  TROPONINI <0.03  --    BNP (last 3 results)  Recent Labs  08/10/14 1227  BNP 36.0  ProBNP (last 3 results) No results for input(s): PROBNP in the last 8760 hours.  CBG:  Recent Labs Lab 08/10/14 1256  GLUCAP 95    Recent Results (from the past 240 hour(s))  MRSA PCR Screening     Status: None   Collection Time: 08/10/14  6:15 PM  Result Value Ref Range Status   MRSA by PCR NEGATIVE NEGATIVE Final    Comment:        The GeneXpert MRSA Assay (FDA approved for NASAL specimens only), is one component of a comprehensive MRSA colonization surveillance program. It is not intended to diagnose MRSA infection nor to guide or monitor treatment for MRSA infections.   Blood culture (routine x 2)     Status: None (Preliminary result)   Collection Time: 08/10/14  7:40 PM  Result Value Ref Range Status   Specimen Description BLOOD RIGHT HAND  Final   Special Requests BOTTLES DRAWN AEROBIC ONLY 4CC  Final   Culture PENDING  Incomplete   Report Status  PENDING  Incomplete  Blood culture (routine x 2)     Status: None (Preliminary result)   Collection Time: 08/10/14  7:48 PM  Result Value Ref Range Status   Specimen Description BLOOD LEFT HAND  Final   Special Requests BOTTLES DRAWN AEROBIC AND ANAEROBIC 5CC  Final   Culture PENDING  Incomplete   Report Status PENDING  Incomplete     Studies: Ct Abdomen Pelvis Wo Contrast  08/10/2014   CLINICAL DATA:  weakness. States he has blood in his stool for a month. Pt. Also thought to had seizure today  EXAM: CT ABDOMEN AND PELVIS WITHOUT CONTRAST  TECHNIQUE: Multidetector CT imaging of the abdomen and pelvis was performed following the standard protocol without IV contrast.  COMPARISON:  08/17/2013 and earlier studies  FINDINGS: Minimal dependent atelectasis in the visualized lung bases right greater than left. Transvenous pacing leads are partially seen.  Fatty liver. Unremarkable spleen. Mild bilateral adrenal hyperplasia. Stable 5 mm cystic lesion in the pancreatic body image 24/2. Stable 2.2 cm right parapelvic renal cyst. No hydronephrosis. Residual contrast material in the renal collecting systems and urinary bladder from previous CT contrast administration. Stable right bladder diverticulum.  Stomach, small bowel, and colon are nondilated. Normal appendix. Scattered descending and proximal sigmoid colon diverticula without adjacent inflammatory/edematous change. Moderate enlargement of the prostate. No free air. No ascites. Mild spondylitic changes in the lower lumbar spine. No adenopathy localized.  IMPRESSION: 1. Descending and sigmoid diverticulosis without CT evidence of diverticulitis. 2. No evidence of mass or adenopathy. 3. Fatty liver 4. Stable right renal cyst  and small cystic pancreatic body lesion.   Electronically Signed   By: Lucrezia Europe M.D.   On: 08/10/2014 17:42   Ct Head Wo Contrast  08/10/2014   CLINICAL DATA:  Collapse with loss of consciousness.  EXAM: CT HEAD WITHOUT CONTRAST   TECHNIQUE: Contiguous axial images were obtained from the base of the skull through the vertex without intravenous contrast.  COMPARISON:  None.  FINDINGS: Mild atrophy. Mild chronic microvascular ischemic change in the white matter.  Negative for acute infarct, hemorrhage, mass lesion.  Calvarium is intact.  Chronic right medial orbital fracture.  IMPRESSION: Atrophy and chronic microvascular ischemia.  No acute abnormality.   Electronically Signed   By: Franchot Gallo M.D.   On: 08/10/2014 14:18   Ct Angio Chest Pe W/cm &/or Wo Cm  08/10/2014   CLINICAL DATA:  Syncope  EXAM: CT ANGIOGRAPHY CHEST WITH  CONTRAST  TECHNIQUE: Multidetector CT imaging of the chest was performed using the standard protocol during bolus administration of intravenous contrast. Multiplanar CT image reconstructions and MIPs were obtained to evaluate the vascular anatomy.  CONTRAST:  24mL OMNIPAQUE IOHEXOL 350 MG/ML SOLN  COMPARISON:  None.  FINDINGS: There are no filling defects in the pulmonary arterial tree to suggest acute pulmonary thromboembolism.  No abnormal mediastinal adenopathy. Dual lead pacemaker device is in place.  No pericardial effusion.  No pleural effusion.  No pneumothorax.  Dependent atelectasis in the lungs.  No mass or consolidation.  No vertebral compression deformity.  Bilateral gynecomastia.  Review of the MIP images confirms the above findings.  IMPRESSION: No evidence of acute pulmonary thromboembolism.   Electronically Signed   By: Marybelle Killings M.D.   On: 08/10/2014 14:25   Dg Chest Portable 1 View  08/10/2014   CLINICAL DATA:  Loss of consciousness.  EXAM: PORTABLE CHEST - 1 VIEW  COMPARISON:  09/23/2013  FINDINGS: Dual-chamber pacer leads from the left are in unremarkable position. There is no cardiomegaly. Stable mild aortic tortuosity. There is no edema, consolidation, effusion, or pneumothorax.  IMPRESSION: Stable exam.   No active disease.   Electronically Signed   By: Monte Fantasia M.D.   On:  08/10/2014 13:19    Scheduled Meds: . diltiazem  240 mg Oral Daily  . enoxaparin (LOVENOX) injection  40 mg Subcutaneous Q24H  . flecainide  100 mg Oral BID  . pyridostigmine  60 mg Oral BID AC  . tamsulosin  0.4 mg Oral Daily   Continuous Infusions: . sodium chloride 50 mL/hr at 08/11/14 1429    Active Problems:   LOC (loss of consciousness)   Syncope    Time spent: 25 minutes.     [facility] Pager [phone]. If 7PM-7AM, please contact night-coverage at [email or web address], password [facility] 08/11/2014, 4:53 PM  LOS: 1 day

## 2014-08-11 NOTE — Consult Note (Signed)
ELECTROPHYSIOLOGY CONSULT NOTE   Patient ID: Jonathon [name] MRN: [id number] DOB/AGE: 77-Sep-1937 77 y.o.  Admit date: 08/10/2014  Primary Physician   Nyoka Cowden, MD Primary Cardiologist   Dr Lovena Le Reason for Consultation   Syncope  HPI:Jonathon [name] is a 79 y.o. year old male with a history of symptomatic bradycardia, s/p Medtronic PPM in 2005 with generator change in 2014, not pacer-dependent. He has been diagnosed with a chronic demyelinating disorder. His legs became weaker and he began to fall. After a fall at work, he retired.   He was seen by Dr. Lovena Le in the office on 03/16 for palpitations that were increasing in frequency and severity. He was having SVT versus atrial tach at 220/minute. Rapid pacing was carried out through the pacemaker no murmur able to terminate the SVT by atrial pacing at 260/minute. His underlying rhythm was sinus tachycardia at 115/minute. He was started on flecainide.  He returned to the office on 03/30 for a nurse check. His blood pressure was 130/97 with a heart rate of 97. He was doing well.  As he was walking out of the room he began feeling dizzy and his legs became unable to support him. He was assisted to the floor and suffered no injuries. His blood pressure was 80/60, heart rate 70, he was diaphoretic.   He was placed in Trendelenburg for blood pressure support. When EMS arrived he sat up but became diaphoretic and unresponsive. He had some possible seizure activity and was incontinent of stool/urine. His blood pressure at that time is not recorded. He was transported to the hospital and admitted. Orthostatic VS are below.             Pulse- Lying 120       Pulse- Lying   BP- Sitting 133/69       BP- Sitting   Pulse- Sitting 125       Pulse- Sitting   BP- Standing at 0 minutes 57/32       BP- Standing at 0 minutes   Pulse- Standing at 0 minutes 140               His device was interrogated and showed no tachycardic events during  his symptoms. His device is functioning well and there was no bradycardia.  His orthostatics are to be rechecked, results pending. He has been hydrated.  Today, he feels well, but is still complaining of orthostatic symptoms.   Of note, he lives alone but states between his daughter and his sister, he will have someone with him 24/7.  Past Medical History  Diagnosis Date  . BENIGN PROSTATIC HYPERTROPHY, HX OF 10/25/2008  . BRADYCARDIA 2005  . HYPERTENSION 11/21/2006  . 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 PACEMAKER, PERMANENT 2005    Gen change 2014 Medtronic Adaptic L dual-chamber pacemaker, serial [id number]   . TOBACCO ABUSE 10/25/2008    Quit 2012  . Iron deficiency anemia, unspecified 04/19/2013  . Asthma     Medtronic 5076 CapSureFix Novus [id number] 06/03/2003 Serial#: 114 months RA  Medtronic 5076 CapSureFix Novus [id number] 06/03/2003 Serial#: 114 months LA Medtronic Adaptic L dual-chamber pacemaker, serial #[id number]  Past Surgical History  Procedure Laterality Date  . Pacemaker insertion  2005  . Colonoscopy  1740,[id number]  . Skin graft Right 1962    wrist  . Permanent pacemaker generator change N/A 06/19/2012    Procedure: PERMANENT PACEMAKER GENERATOR CHANGE;  Surgeon: [name], MD;  Medtronic Adaptic L dual-chamber pacemaker, serial [id number]      No Known Allergies  I have reviewed the patient's current medications . diltiazem  240 mg Oral Daily  . enoxaparin (LOVENOX) injection  40 mg Subcutaneous Q24H  . flecainide  75 mg Oral BID  . tamsulosin  0.4 mg Oral Daily   . sodium chloride 100 mL/hr at 08/11/14 3846   Prior to Admission medications   Medication Sig Start Date End Date Taking? Authorizing Provider  Cyanocobalamin (VITAMIN B 12 PO) Take 1,000 mg by mouth daily.   Yes Historical Provider, MD  diltiazem (CARDIZEM LA) 240 MG 24 hr tablet Take 1 tablet (240 mg total) by mouth daily. 01/14/13  Yes Marletta Lor,  MD  flecainide (TAMBOCOR) 150 MG tablet Take 0.5 tablets (75 mg total) by mouth 2 (two) times daily. 07/27/14  Yes [name], MD  Garlic 6599 MG CAPS Take 2,000 mg by mouth daily at 12 noon.   Yes Historical Provider, MD  Multiple Vitamins-Minerals (CENTRUM ADULTS PO) Take by mouth.   Yes Historical Provider, MD  naproxen sodium (ANAPROX) 220 MG tablet Take 220 mg by mouth daily as needed (pain).   Yes Historical Provider, MD  Omega-3 Fatty Acids (FISH OIL) 1000 MG CAPS Take by mouth every morning.   Yes Historical Provider, MD  tamsulosin (FLOMAX) 0.4 MG CAPS capsule TAKE 1 CAPSULE BY MOUTH DAILY 02/08/14  Yes Marletta Lor, MD  testosterone (ANDROGEL) 50 MG/5GM (1%) GEL APPLY 1 PACKET ONTO THE SKIN ONCE DAILY AS DIRECTED 05/24/14  Yes Volanda Napoleon, MD     History   Social History  . Marital Status: Divorced    Spouse Name: N/A  . Number of Children: 4  . Years of Education: N/A   Occupational History  . 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    Social History Main Topics  . Smoking status: Former Smoker -- 0.25 packs/day for 46 years    Types: Cigarettes    Start date: 03/16/1965    Quit date: 06/15/2010  . Smokeless tobacco: Never Used     Comment: quit 3 years ago  . Alcohol Use: 0.0 oz/week    0 Standard drinks or equivalent per week     Comment: 4 beers per day  . Drug Use: No  . Sexual Activity: Not on file   Other Topics Concern  . 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    Family Status  Relation Status Death Age  . Father Deceased 17  . Mother Deceased 75    parkinson's disease   Family History  Problem Relation Age of Onset  . Breast cancer Sister     Living, 77  . Diabetes type II Brother     Living, 33  . Breast cancer Sister     Living, 84  . Heart attack Father     Died, 38  . Kidney disease Father   . Parkinson's disease Mother     Died,  68  . Diabetes Daughter     Living, 66  . Diabetes Son     Living, 44  . Colon cancer Neg Hx   . Rectal cancer Neg Hx   . Stomach cancer Neg Hx   . Ovarian cancer Daughter      ROS: He denies problems with constipation, urinary retention, dry eyes or dry mouth.  Full 14 point review of systems complete and found to be negative unless listed above.  Physical Exam: Blood pressure 179/101, pulse 94, temperature 98.7 F (37.1 C), temperature source Oral, resp. rate 25, height 5\' 10"  (1.778 m), weight 178 lb 12.7 oz (81.1 kg), SpO2 97 %.  General: Well developed, well nourished, male in no acute distress Head: Eyes PERRLA, No xanthomas.   Normocephalic and atraumatic, oropharynx without edema or exudate. Dentition: Good Lungs: Clear bilaterally Heart: HRRR S1 S2, no rub/gallop, soft systolic murmur. pulses are 2+ in all 4 extrem.   Neck: No carotid bruits. No lymphadenopathy.  JVD not elevated. Abdomen: Bowel sounds present, abdomen soft and non-tender without masses or hernias noted. Msk:  No spine or cva tenderness. Bilateral lower extremity weakness, no joint deformities or effusions. Extremities: No clubbing or cyanosis. No edema.  Neuro: Alert and oriented X 3. No focal deficits noted. Psych:  Good affect, responds appropriately Skin: No rashes or lesions noted.  Labs:   Lab Results  Component Value Date   WBC 4.2 08/11/2014   HGB 10.3* 08/11/2014   HCT 31.1* 08/11/2014   MCV 85.9 08/11/2014   PLT 104* 08/11/2014    Recent Labs  08/10/14 1227  INR 1.04     Recent Labs Lab 08/11/14 0305  NA 141  K 4.9  CL 106  CO2 23  BUN 13  CREATININE 1.27  CALCIUM 8.2*  PROT 6.1  BILITOT 1.6*  ALKPHOS 90  ALT 62*  AST 94*  GLUCOSE 84  ALBUMIN 2.8*   No results found for: MG  Recent Labs  08/10/14 1227 08/11/14 0305  CKTOTAL  --  72  TROPONINI <0.03  --     Recent Labs  08/10/14 1230  TROPIPOC 0.00   B NATRIURETIC PEPTIDE  Date/Time Value Ref Range Status   08/10/2014 12:27 PM 36.0 0.0 - 100.0 pg/mL Final   TSH  Date/Time Value Ref Range Status  08/10/2014 07:40 PM 0.875 0.350 - 4.500 uIU/mL Final   Echo: Ordered, not performed yet  ECG:  08/10/2014 Sinus rhythm, first-degree AV block, right bundle branch block Vent. rate 82 BPM PR interval 230 ms QRS duration 157 ms QT/QTc 416/486 ms P-R-T axes 11 58 60  Radiology:  Ct Abdomen Pelvis Wo Contrast  08/10/2014   CLINICAL DATA:  weakness. States he has blood in his stool for a month. Pt. Also thought to had seizure today  EXAM: CT ABDOMEN AND PELVIS WITHOUT CONTRAST  TECHNIQUE: Multidetector CT imaging of the abdomen and pelvis was performed following the standard protocol without IV contrast.  COMPARISON:  08/17/2013 and earlier studies  FINDINGS: Minimal dependent atelectasis in the visualized lung bases right greater than left. Transvenous pacing leads are partially seen.  Fatty liver. Unremarkable spleen. Mild bilateral adrenal hyperplasia. Stable 5 mm cystic lesion in the pancreatic body image 24/2. Stable 2.2 cm right parapelvic renal cyst. No hydronephrosis. Residual contrast material in the renal collecting systems and urinary bladder from previous CT contrast administration. Stable right bladder diverticulum.  Stomach, small bowel, and colon are nondilated. Normal appendix. Scattered descending and proximal sigmoid colon diverticula without adjacent inflammatory/edematous change. Moderate enlargement of the prostate. No free air. No ascites. Mild spondylitic changes in the lower lumbar spine. No adenopathy localized.  IMPRESSION: 1. Descending and sigmoid diverticulosis without CT evidence of diverticulitis. 2. No evidence of mass or adenopathy. 3. Fatty liver 4. Stable right renal cyst  and small cystic pancreatic body lesion.   Electronically Signed  By: Lucrezia Europe M.D.   On: 08/10/2014 17:42   Ct Head Wo Contrast  08/10/2014   CLINICAL DATA:  Collapse with loss of consciousness.  EXAM:  CT HEAD WITHOUT CONTRAST  TECHNIQUE: Contiguous axial images were obtained from the base of the skull through the vertex without intravenous contrast.  COMPARISON:  None.  FINDINGS: Mild atrophy. Mild chronic microvascular ischemic change in the white matter.  Negative for acute infarct, hemorrhage, mass lesion.  Calvarium is intact.  Chronic right medial orbital fracture.  IMPRESSION: Atrophy and chronic microvascular ischemia.  No acute abnormality.   Electronically Signed   By: Franchot Gallo M.D.   On: 08/10/2014 14:18   Ct Angio Chest Pe W/cm &/or Wo Cm  08/10/2014   CLINICAL DATA:  Syncope  EXAM: CT ANGIOGRAPHY CHEST WITH CONTRAST  TECHNIQUE: Multidetector CT imaging of the chest was performed using the standard protocol during bolus administration of intravenous contrast. Multiplanar CT image reconstructions and MIPs were obtained to evaluate the vascular anatomy.  CONTRAST:  80mL OMNIPAQUE IOHEXOL 350 MG/ML SOLN  COMPARISON:  None.  FINDINGS: There are no filling defects in the pulmonary arterial tree to suggest acute pulmonary thromboembolism.  No abnormal mediastinal adenopathy. Dual lead pacemaker device is in place.  No pericardial effusion.  No pleural effusion.  No pneumothorax.  Dependent atelectasis in the lungs.  No mass or consolidation.  No vertebral compression deformity.  Bilateral gynecomastia.  Review of the MIP images confirms the above findings.  IMPRESSION: No evidence of acute pulmonary thromboembolism.   Electronically Signed   By: Marybelle Killings M.D.   On: 08/10/2014 14:25   Dg Chest Portable 1 View  08/10/2014   CLINICAL DATA:  Loss of consciousness.  EXAM: PORTABLE CHEST - 1 VIEW  COMPARISON:  09/23/2013  FINDINGS: Dual-chamber pacer leads from the left are in unremarkable position. There is no cardiomegaly. Stable mild aortic tortuosity. There is no edema, consolidation, effusion, or pneumothorax.  IMPRESSION: Stable exam.   No active disease.   Electronically Signed   By:  Monte Fantasia M.D.   On: 08/10/2014 13:19   ASSESSMENT AND PLAN:   The patient was seen today by Dr. Caryl Comes, the patient evaluated and the data reviewed.  Active Problems:   LOC (loss of consciousness)   Syncope Mr. Bluestein has a chronic demyelinating disorder and has significant orthostatic hypotension, not related to hypovolemia. His device was interrogated and there were no significant cardiac arrhythmias during the episodes of syncope.  Plan: He will be maintained in reversed Trendelenburg while in bed, and is encouraged to sit up in a chair as much as possible. He will get an abdominal binder. He will be started on Mestinon 60 mg twice a day if okay with neurology. His flecainide has been restarted with the dose increased to 100 mg bid. As he is tolerating by mouth intake well, will decrease IV fluids to 50 mL/h.  Other medications and therapies per Dr. Caryl Comes.  SignedRosaria Ferries, PA-C 08/11/2014 8:29 AM Murlean Hark [phone]  Co-Sign MD

## 2014-08-11 NOTE — Progress Notes (Signed)
ANTIBIOTIC CONSULT NOTE - INITIAL  Pharmacy Consult for vancomycin  Indication: Bacteremia  No Known Allergies  Patient Measurements: Height: 5\' 10"  (177.8 cm) Weight: 178 lb 12.7 oz (81.1 kg) IBW/kg (Calculated) : 73   Vital Signs: Temp: 98.6 F (37 C) (03/31 1601) Temp Source: Oral (03/31 1601) BP: 177/103 mmHg (03/31 1601) Pulse Rate: 111 (03/31 1601) Intake/Output from previous day: 03/30 0701 - 03/31 0700 In: 1006.7 [I.V.:1006.7] Out: -  Intake/Output from this shift: Total I/O In: 1073.3 [P.O.:420; I.V.:653.3] Out: 400 [Urine:400]  Labs:  Recent Labs  08/10/14 1227 08/10/14 1232 08/10/14 1948 08/11/14 0305  WBC 4.4  --  5.0 4.2  HGB 12.0* 13.6 11.4* 10.3*  PLT 136*  --  117* 104*  CREATININE 1.22 1.40* 1.14 1.27   Estimated Creatinine Clearance: 51.1 mL/min (by C-G formula based on Cr of 1.27). No results for input(s): VANCOTROUGH, VANCOPEAK, VANCORANDOM, GENTTROUGH, GENTPEAK, GENTRANDOM, TOBRATROUGH, TOBRAPEAK, TOBRARND, AMIKACINPEAK, AMIKACINTROU, AMIKACIN in the last 72 hours.   Microbiology: Recent Results (from the past 720 hour(s))  MRSA PCR Screening     Status: None   Collection Time: 08/10/14  6:15 PM  Result Value Ref Range Status   MRSA by PCR NEGATIVE NEGATIVE Final    Comment:        The GeneXpert MRSA Assay (FDA approved for NASAL specimens only), is one component of a comprehensive MRSA colonization surveillance program. It is not intended to diagnose MRSA infection nor to guide or monitor treatment for MRSA infections.   Blood culture (routine x 2)     Status: None (Preliminary result)   Collection Time: 08/10/14  7:40 PM  Result Value Ref Range Status   Specimen Description BLOOD RIGHT HAND  Final   Special Requests BOTTLES DRAWN AEROBIC ONLY 4CC  Final   Culture   Final    GRAM POSITIVE COCCI IN CLUSTERS Note: Gram Stain Report Called to,Read Back By and Verified With: HEATHER ON [place] ON [id number] BY CASTC Performed at  [facility]    Report Status PENDING  Incomplete  Blood culture (routine x 2)     Status: None (Preliminary result)   Collection Time: 08/10/14  7:48 PM  Result Value Ref Range Status   Specimen Description BLOOD LEFT HAND  Final   Special Requests BOTTLES DRAWN AEROBIC AND ANAEROBIC 5CC  Final   Culture PENDING  Incomplete   Report Status PENDING  Incomplete    Medical History: Past Medical History  Diagnosis Date  . BENIGN PROSTATIC HYPERTROPHY, HX OF 10/25/2008  . BRADYCARDIA 2005  . HYPERTENSION 11/21/2006  . 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 PACEMAKER, PERMANENT 2005    Gen change 2014 Medtronic Adaptic L dual-chamber pacemaker, serial [id number]   . TOBACCO ABUSE 10/25/2008    Quit 2012  . Iron deficiency anemia, unspecified 04/19/2013  . Asthma   Assessment: 36 y old male admitted for syncope. Patient now with 1/2 blood cultures positive for GPC. New orders to start empiric vancomycin. No fevers noted, wbc normal.   Goal of Therapy:  Vancomycin trough level 15-20 mcg/ml  Plan:  Measure antibiotic drug levels at steady state Follow up culture results Vancomycin 750mg  IV q12 hours  Erin Hearing PharmD., BCPS Clinical Pharmacist Pager [phone] 08/11/2014 6:22 PM

## 2014-08-11 NOTE — Progress Notes (Signed)
CRITICAL VALUE ALERT  Critical value received:  Positive blood cultutre; gram + coxxi in clusters 08/10/2014 0740  Date of notification:  08/11/2014    Time of notification:  8251          Critical value read back:Yes.    Nurse who received alert:  Leonidas Romberg, RN  MD notified (1st page):  Dr. Karleen Hampshire       Time of first page:  1808  MD notified (2nd page):  Time of second page:  Responding MD:  Dr. Karleen Hampshire  Time MD responded:  [phone]

## 2014-08-11 NOTE — Progress Notes (Signed)
EEG Completed; Results Pending  

## 2014-08-12 DIAGNOSIS — I951 Orthostatic hypotension: Secondary | ICD-10-CM | POA: Insufficient documentation

## 2014-08-12 DIAGNOSIS — R55 Syncope and collapse: Secondary | ICD-10-CM

## 2014-08-12 LAB — PROTEIN ELECTROPHORESIS, SERUM
A/G RATIO SPE: 0.9 (ref 0.7–2.0)
ALBUMIN ELP: 3.1 g/dL — AB (ref 3.2–5.6)
ALPHA-1-GLOBULIN: 0.2 g/dL (ref 0.1–0.4)
ALPHA-2-GLOBULIN: 0.8 g/dL (ref 0.4–1.2)
BETA GLOBULIN: 1.4 g/dL — AB (ref 0.6–1.3)
Gamma Globulin: 1.1 g/dL (ref 0.5–1.6)
Globulin, Total: 3.5 g/dL (ref 2.0–4.5)
TOTAL PROTEIN ELP: 6.6 g/dL (ref 6.0–8.5)

## 2014-08-12 LAB — CBC
HEMATOCRIT: 28.6 % — AB (ref 39.0–52.0)
Hemoglobin: 9.6 g/dL — ABNORMAL LOW (ref 13.0–17.0)
MCH: 28.7 pg (ref 26.0–34.0)
MCHC: 33.6 g/dL (ref 30.0–36.0)
MCV: 85.6 fL (ref 78.0–100.0)
PLATELETS: 97 10*3/uL — AB (ref 150–400)
RBC: 3.34 MIL/uL — AB (ref 4.22–5.81)
RDW: 14.3 % (ref 11.5–15.5)
WBC: 2.8 10*3/uL — AB (ref 4.0–10.5)

## 2014-08-12 LAB — IRON AND TIBC
Iron: 123 ug/dL (ref 42–165)
SATURATION RATIOS: 49 % (ref 20–55)
TIBC: 249 ug/dL (ref 215–435)
UIBC: 126 ug/dL (ref 125–400)

## 2014-08-12 LAB — BASIC METABOLIC PANEL
Anion gap: 6 (ref 5–15)
BUN: 11 mg/dL (ref 6–23)
CO2: 26 mmol/L (ref 19–32)
Calcium: 8 mg/dL — ABNORMAL LOW (ref 8.4–10.5)
Chloride: 104 mmol/L (ref 96–112)
Creatinine, Ser: 0.84 mg/dL (ref 0.50–1.35)
GFR calc Af Amer: >90 mL/min (ref >90)
GFR, EST NON AFRICAN AMERICAN: 83 mL/min — AB (ref >90)
GLUCOSE: 91 mg/dL (ref 70–99)
POTASSIUM: 3.3 mmol/L — AB (ref 3.5–5.1)
Sodium: 136 mmol/L (ref 135–145)

## 2014-08-12 LAB — HEPATIC FUNCTION PANEL
ALT: 46 U/L (ref 0–53)
AST: 71 U/L — ABNORMAL HIGH (ref 0–37)
Albumin: 2.5 g/dL — ABNORMAL LOW (ref 3.5–5.2)
Alkaline Phosphatase: 77 U/L (ref 39–117)
BILIRUBIN INDIRECT: 0.7 mg/dL (ref 0.3–0.9)
Bilirubin, Direct: 0.3 mg/dL (ref 0.0–0.5)
TOTAL PROTEIN: 5.9 g/dL — AB (ref 6.0–8.3)
Total Bilirubin: 1 mg/dL (ref 0.3–1.2)

## 2014-08-12 LAB — RETICULOCYTES
RBC.: 3.34 MIL/uL — AB (ref 4.22–5.81)
Retic Count, Absolute: 40.1 10*3/uL (ref 19.0–186.0)
Retic Ct Pct: 1.2 % (ref 0.4–3.1)

## 2014-08-12 LAB — CULTURE, BLOOD (ROUTINE X 2)

## 2014-08-12 LAB — FERRITIN: Ferritin: 176 ng/mL (ref 22–322)

## 2014-08-12 LAB — VITAMIN B12: Vitamin B-12: 643 pg/mL (ref 211–911)

## 2014-08-12 LAB — OCCULT BLOOD X 1 CARD TO LAB, STOOL: Fecal Occult Bld: NEGATIVE

## 2014-08-12 LAB — FOLATE: Folate: 18.6 ng/mL

## 2014-08-12 MED ORDER — HYDRALAZINE HCL 25 MG PO TABS
25.0000 mg | ORAL_TABLET | Freq: Three times a day (TID) | ORAL | Status: DC
  Administered 2014-08-12 – 2014-08-15 (×11): 25 mg via ORAL
  Filled 2014-08-12 (×13): qty 1

## 2014-08-12 MED ORDER — POTASSIUM CHLORIDE CRYS ER 20 MEQ PO TBCR
40.0000 meq | EXTENDED_RELEASE_TABLET | Freq: Once | ORAL | Status: AC
  Administered 2014-08-12: 40 meq via ORAL
  Filled 2014-08-12: qty 2

## 2014-08-12 NOTE — Progress Notes (Signed)
Utilization Review Completed.  

## 2014-08-12 NOTE — Progress Notes (Signed)
Called report to [facility]  RN pt to be transferred per w/c to 5West bed 01 will be on telemetry. ABD binder measured for and ordered per materials management

## 2014-08-12 NOTE — Progress Notes (Signed)
Subjective: Patient has had no further syncopal events.  No new complaints.    Objective: Current vital signs: BP 141/86 mmHg  Pulse 66  Temp(Src) 98 F (36.7 C) (Oral)  Resp 18  Ht 5\' 10"  (1.778 m)  Wt 81.1 kg (178 lb 12.7 oz)  BMI 25.65 kg/m2  SpO2 99% Vital signs in last 24 hours: Temp:  [98 F (36.7 C)-98.6 F (37 C)] 98 F (36.7 C) (04/01 0754) Pulse Rate:  [66-111] 66 (04/01 0427) Resp:  [18-30] 18 (04/01 0427) BP: (120-177)/(70-104) 141/86 mmHg (04/01 0427) SpO2:  [95 %-99 %] 99 % (04/01 0427)  Intake/Output from previous day: 03/31 0701 - 04/01 0700 In: 2163.3 [P.O.:660; I.V.:1353.3; IV Piggyback:150] Out: 400 [Urine:400] Intake/Output this shift: Total I/O In: 50 [I.V.:50] Out: -  Nutritional status: Diet heart healthy/carb modified Room service appropriate?: Yes; Fluid consistency:: Thin  Neurologic Exam: Mental Status: Alert, oriented, thought content appropriate. Speech fluent without evidence of aphasia. Able to follow 3 step commands without difficulty. Cranial Nerves: II: Discs flat bilaterally; Visual fields grossly normal, pupils equal, round, reactive to light and accommodation III,IV, VI: ptosis not present, extra-ocular motions intact bilaterally V,VII: smile symmetric, facial light touch sensation normal bilaterally VIII: hearing normal bilaterally IX,X: gag reflex present XI: bilateral shoulder shrug XII: midline tongue extension Motor: Right :Upper extremity 5/5Left: Upper extremity 5/5 Lower extremity 5/5Lower extremity 5/5 Tremor noted throughout in face, arms and legs. Worse with movement. Increased tone on the right.  Sensory: Pinprick and light touch intact throughout, bilaterally Deep Tendon Reflexes: 2+ and symmetric with absent AJ's bilaterally Plantars: Right: downgoingLeft:  downgoing Cerebellar: normal finger-to-nose and normal heel-to-shin testing with tremor but no dysmetria  Lab Results: Basic Metabolic Panel:  Recent Labs Lab 08/10/14 1227 08/10/14 1232 08/10/14 1432 08/10/14 1948 08/11/14 0305 08/12/14 0333  NA 140 139  --   --  141 136  K 5.3* 5.4* 4.7  --  4.9 3.3*  CL 104 104  --   --  106 104  CO2 22  --   --   --  23 26  GLUCOSE 105* 107*  --   --  84 91  BUN 12 16  --   --  13 11  CREATININE 1.22 1.40*  --  1.14 1.27 0.84  CALCIUM 8.7  --   --   --  8.2* 8.0*    Liver Function Tests:  Recent Labs Lab 08/10/14 1227 08/11/14 0305  AST 147* 94*  ALT 72* 62*  ALKPHOS 103 90  BILITOT 1.5* 1.6*  PROT 6.7 6.1  ALBUMIN 3.1* 2.8*   No results for input(s): LIPASE, AMYLASE in the last 168 hours. No results for input(s): AMMONIA in the last 168 hours.  CBC:  Recent Labs Lab 08/10/14 1227 08/10/14 1232 08/10/14 1948 08/11/14 0305 08/12/14 0333  WBC 4.4  --  5.0 4.2 2.8*  NEUTROABS 2.0  --   --   --   --   HGB 12.0* 13.6 11.4* 10.3* 9.6*  HCT 35.5* 40.0 33.2* 31.1* 28.6*  MCV 86.0  --  85.3 85.9 85.6  PLT 136*  --  117* 104* 97*    Cardiac Enzymes:  Recent Labs Lab 08/10/14 1227 08/11/14 0305  CKTOTAL  --  72  TROPONINI <0.03  --     Lipid Panel: No results for input(s): CHOL, TRIG, HDL, CHOLHDL, VLDL, LDLCALC in the last 168 hours.  CBG:  Recent Labs Lab 08/10/14 [place]    Microbiology:  Results for orders placed or performed during the hospital encounter of 08/10/14  Urine culture     Status: None   Collection Time: 08/10/14  4:53 PM  Result Value Ref Range Status   Specimen Description URINE, CLEAN CATCH  Final   Special Requests NONE  Final   Colony Count NO GROWTH Performed at [facility]   Final   Culture NO GROWTH Performed at [facility]   Final   Report Status 08/11/2014 FINAL  Final  MRSA PCR Screening     Status: None   Collection Time: 08/10/14  6:15 PM   Result Value Ref Range Status   MRSA by PCR NEGATIVE NEGATIVE Final    Comment:        The GeneXpert MRSA Assay (FDA approved for NASAL specimens only), is one component of a comprehensive MRSA colonization surveillance program. It is not intended to diagnose MRSA infection nor to guide or monitor treatment for MRSA infections.   Blood culture (routine x 2)     Status: None   Collection Time: 08/10/14  7:40 PM  Result Value Ref Range Status   Specimen Description BLOOD RIGHT HAND  Final   Special Requests BOTTLES DRAWN AEROBIC ONLY 4CC  Final   Culture   Final    STAPHYLOCOCCUS SPECIES (COAGULASE NEGATIVE) Note: THE SIGNIFICANCE OF ISOLATING THIS ORGANISM FROM A SINGLE SET OF BLOOD CULTURES WHEN MULTIPLE SETS ARE DRAWN IS UNCERTAIN. PLEASE NOTIFY THE MICROBIOLOGY DEPARTMENT WITHIN ONE WEEK IF SPECIATION AND SENSITIVITIES ARE REQUIRED. Note: Gram Stain Report Called to,Read Back By and Verified With: [place] [id number] BY CASTC Performed at [facility]    Report Status 08/12/2014 FINAL  Final  Blood culture (routine x 2)     Status: None (Preliminary result)   Collection Time: 08/10/14  7:48 PM  Result Value Ref Range Status   Specimen Description BLOOD LEFT HAND  Final   Special Requests BOTTLES DRAWN AEROBIC AND ANAEROBIC 5CC  Final   Culture   Final           BLOOD CULTURE RECEIVED NO GROWTH TO DATE CULTURE WILL BE HELD FOR 5 DAYS BEFORE ISSUING A FINAL NEGATIVE REPORT Performed at [facility]    Report Status PENDING  Incomplete    Coagulation Studies:  Recent Labs  08/10/14 1227  LABPROT 13.7  INR 1.04    Imaging: Ct Abdomen Pelvis Wo Contrast  08/10/2014   CLINICAL DATA:  weakness. States he has blood in his stool for a month. Pt. Also thought to had seizure today  EXAM: CT ABDOMEN AND PELVIS WITHOUT CONTRAST  TECHNIQUE: Multidetector CT imaging of the abdomen and pelvis was performed following the standard protocol without IV  contrast.  COMPARISON:  08/17/2013 and earlier studies  FINDINGS: Minimal dependent atelectasis in the visualized lung bases right greater than left. Transvenous pacing leads are partially seen.  Fatty liver. Unremarkable spleen. Mild bilateral adrenal hyperplasia. Stable 5 mm cystic lesion in the pancreatic body image 24/2. Stable 2.2 cm right parapelvic renal cyst. No hydronephrosis. Residual contrast material in the renal collecting systems and urinary bladder from previous CT contrast administration. Stable right bladder diverticulum.  Stomach, small bowel, and colon are nondilated. Normal appendix. Scattered descending and proximal sigmoid colon diverticula without adjacent inflammatory/edematous change. Moderate enlargement of the prostate. No free air. No ascites. Mild spondylitic changes in the lower lumbar spine. No adenopathy localized.  IMPRESSION: 1. Descending and sigmoid diverticulosis without CT  evidence of diverticulitis. 2. No evidence of mass or adenopathy. 3. Fatty liver 4. Stable right renal cyst  and small cystic pancreatic body lesion.   Electronically Signed   By: Lucrezia Europe M.D.   On: 08/10/2014 17:42   Ct Head Wo Contrast  08/10/2014   CLINICAL DATA:  Collapse with loss of consciousness.  EXAM: CT HEAD WITHOUT CONTRAST  TECHNIQUE: Contiguous axial images were obtained from the base of the skull through the vertex without intravenous contrast.  COMPARISON:  None.  FINDINGS: Mild atrophy. Mild chronic microvascular ischemic change in the white matter.  Negative for acute infarct, hemorrhage, mass lesion.  Calvarium is intact.  Chronic right medial orbital fracture.  IMPRESSION: Atrophy and chronic microvascular ischemia.  No acute abnormality.   Electronically Signed   By: Franchot Gallo M.D.   On: 08/10/2014 14:18   Ct Angio Chest Pe W/cm &/or Wo Cm  08/10/2014   CLINICAL DATA:  Syncope  EXAM: CT ANGIOGRAPHY CHEST WITH CONTRAST  TECHNIQUE: Multidetector CT imaging of the chest was  performed using the standard protocol during bolus administration of intravenous contrast. Multiplanar CT image reconstructions and MIPs were obtained to evaluate the vascular anatomy.  CONTRAST:  66mL OMNIPAQUE IOHEXOL 350 MG/ML SOLN  COMPARISON:  None.  FINDINGS: There are no filling defects in the pulmonary arterial tree to suggest acute pulmonary thromboembolism.  No abnormal mediastinal adenopathy. Dual lead pacemaker device is in place.  No pericardial effusion.  No pleural effusion.  No pneumothorax.  Dependent atelectasis in the lungs.  No mass or consolidation.  No vertebral compression deformity.  Bilateral gynecomastia.  Review of the MIP images confirms the above findings.  IMPRESSION: No evidence of acute pulmonary thromboembolism.   Electronically Signed   By: Marybelle Killings M.D.   On: 08/10/2014 14:25   Dg Chest Portable 1 View  08/10/2014   CLINICAL DATA:  Loss of consciousness.  EXAM: PORTABLE CHEST - 1 VIEW  COMPARISON:  09/23/2013  FINDINGS: Dual-chamber pacer leads from the left are in unremarkable position. There is no cardiomegaly. Stable mild aortic tortuosity. There is no edema, consolidation, effusion, or pneumothorax.  IMPRESSION: Stable exam.   No active disease.   Electronically Signed   By: Monte Fantasia M.D.   On: 08/10/2014 13:19    Medications:  I have reviewed the patient's current medications. Scheduled: . diltiazem  240 mg Oral Daily  . enoxaparin (LOVENOX) injection  40 mg Subcutaneous Q24H  . flecainide  100 mg Oral BID  . hydrALAZINE  25 mg Oral 3 times per day  . potassium chloride  40 mEq Oral Once  . pyridostigmine  60 mg Oral BID AC  . tamsulosin  0.4 mg Oral Daily  . vancomycin  750 mg Intravenous Q12H    Assessment/Plan: Patient without further events.  EEG normal.  Seizure disorder unlikely.    Recommendations: 1.  No further neurologic intervention is recommended at this time.  If further questions arise, please call or page at that time.  Thank  you for allowing neurology to participate in the care of this patient.    LOS: 2 days   Alexis Goodell, MD [facility] [phone] 08/12/2014  9:21 AM

## 2014-08-12 NOTE — Clinical Documentation Improvement (Signed)
Presents with Orthostatic Hypotension, Acidosis.   Patient with abnormal CBC  Patient is leukopenic (2.8), anemic ( 9.6 / 28.6), thrombocytopenic (97)  Please provide a diagnosis with etiology if known and document findings in next progress note and include in discharge summary if applicable.  Thank You, Zoila Shutter ,RN Clinical Documentation Specialist:  [facility]

## 2014-08-12 NOTE — Progress Notes (Signed)
Pt transferred per w/c to 5 west bed 01 with all belongings per w/c  Accompanied by nurse tech family aware of transfer

## 2014-08-12 NOTE — Progress Notes (Signed)
Pt NPO for ABD ultrasound now test is cancelled will resume diet and all meds

## 2014-08-12 NOTE — Progress Notes (Signed)
RN informed by tele about patient presenting ST with multiform PVC. MD was notified and EKG was  performed per MD order. Will continue to monitor.

## 2014-08-12 NOTE — Progress Notes (Addendum)
[facility] PROGRESS NOTE  Jonathon [name] ACZ:[id number] DOB: 24-Feb-1936 DOA: 08/10/2014 PCP: Nyoka Cowden, MD Interim summary: 22 y old male admitted for syncope.  Assessment/Plan: 1. 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  Am cortisol level is 22.6. He was initially admitted to step down and started on IV fluids. His repeat orthostatics are negative and his bp parameters are much better.  Cardiology and neurology consulted and recommendations given.  EEG ordered and was found to be negative for epileptiform activity.  Echocardiogram ordered , results are pending.     Bradycardia and s/p PPM : interrogated and did not show any significant cardiac arrhythmias .  Resume flecainide. Echocardiogram ordered and  Is pending.    Hypertension: BP parameters are much better.    BPH: Resume flomax.    elevated liver function tests: Asymptomatic. Improved from yesterday.    Mild anemia and thrombocytopenia: Unclear etiology.  Get anemia panel and smear. Anemia panel reveals normal ferritin, iron levels and normal b12 and folate levels.  Probably anemia of chronic disease. His retic count is also within normal. Limits.   Hypokalemia: replete as needed.  Repeat level in am. Get magnesium levels too.   Coag negative staph positive in one blood culture bottle, probably a contaminant. Initially he was started on IV vancomycin and this was discontinued earlier this am.    Code Status: full code.  Family Communication: family at bedside Disposition Plan: transfer to telemetry.    Consultants:  [facility].   Procedures:  EEG  Antibiotics:  none  HPI/Subjective: No new complaints. No dizziness, ches tpain or sob.   Objective: Filed Vitals:   08/12/14 1251  BP: 155/72  Pulse:   Temp: 98.4 F (36.9 C)  Resp:     Intake/Output Summary (Last 24 hours) at 08/12/14 1348 Last data filed  at 08/12/14 1300  Gross per 24 hour  Intake   2160 ml  Output    380 ml  Net   1780 ml   Filed Weights   08/10/14 1222 08/10/14 1700  Weight: 82.555 kg (182 lb) 81.1 kg (178 lb 12.7 oz)    Exam:   General:  Alert afebrile comfortable sitting in the chair  Cardiovascular: s1s2  Respiratory: clear to ausculatation, no wheezing or rhonchi  Abdomen: soft non tender non distended bowel sounds heard  Musculoskeletal: no pedal edema.   Data Reviewed: Basic Metabolic Panel:  Recent Labs Lab 08/10/14 1227 08/10/14 1232 08/10/14 1432 08/10/14 1948 08/11/14 0305 08/12/14 0333  NA 140 139  --   --  141 136  K 5.3* 5.4* 4.7  --  4.9 3.3*  CL 104 104  --   --  106 104  CO2 22  --   --   --  23 26  GLUCOSE 105* 107*  --   --  84 91  BUN 12 16  --   --  13 11  CREATININE 1.22 1.40*  --  1.14 1.27 0.84  CALCIUM 8.7  --   --   --  8.2* 8.0*   Liver Function Tests:  Recent Labs Lab 08/10/14 1227 08/11/14 0305 08/12/14 1030  AST 147* 94* 71*  ALT 72* 62* 46  ALKPHOS 103 90 77  BILITOT 1.5* 1.6* 1.0  PROT 6.7 6.1 5.9*  ALBUMIN 3.1* 2.8* 2.5*   No results for input(s): LIPASE, AMYLASE in the last 168 hours. No results for input(s): AMMONIA in the last 168 hours.  CBC:  Recent Labs Lab 08/10/14 1227 08/10/14 1232 08/10/14 1948 08/11/14 0305 08/12/14 0333  WBC 4.4  --  5.0 4.2 2.8*  NEUTROABS 2.0  --   --   --   --   HGB 12.0* 13.6 11.4* 10.3* 9.6*  HCT 35.5* 40.0 33.2* 31.1* 28.6*  MCV 86.0  --  85.3 85.9 85.6  PLT 136*  --  117* 104* 97*   Cardiac Enzymes:  Recent Labs Lab 08/10/14 1227 08/11/14 0305  CKTOTAL  --  72  TROPONINI <0.03  --    BNP (last 3 results)  Recent Labs  08/10/14 1227  BNP 36.0    ProBNP (last 3 results) No results for input(s): PROBNP in the last 8760 hours.  CBG:  Recent Labs Lab 08/10/14 1256  GLUCAP 95    Recent Results (from the past 240 hour(s))  Urine culture     Status: None   Collection Time: 08/10/14   4:53 PM  Result Value Ref Range Status   Specimen Description URINE, CLEAN CATCH  Final   Special Requests NONE  Final   Colony Count NO GROWTH Performed at [facility]   Final   Culture NO GROWTH Performed at [facility]   Final   Report Status 08/11/2014 FINAL  Final  MRSA PCR Screening     Status: None   Collection Time: 08/10/14  6:15 PM  Result Value Ref Range Status   MRSA by PCR NEGATIVE NEGATIVE Final    Comment:        The GeneXpert MRSA Assay (FDA approved for NASAL specimens only), is one component of a comprehensive MRSA colonization surveillance program. It is not intended to diagnose MRSA infection nor to guide or monitor treatment for MRSA infections.   Blood culture (routine x 2)     Status: None   Collection Time: 08/10/14  7:40 PM  Result Value Ref Range Status   Specimen Description BLOOD RIGHT HAND  Final   Special Requests BOTTLES DRAWN AEROBIC ONLY 4CC  Final   Culture   Final    STAPHYLOCOCCUS SPECIES (COAGULASE NEGATIVE) Note: THE SIGNIFICANCE OF ISOLATING THIS ORGANISM FROM A SINGLE SET OF BLOOD CULTURES WHEN MULTIPLE SETS ARE DRAWN IS UNCERTAIN. PLEASE NOTIFY THE MICROBIOLOGY DEPARTMENT WITHIN ONE WEEK IF SPECIATION AND SENSITIVITIES ARE REQUIRED. Note: Gram Stain Report Called to,Read Back By and Verified With: [place] [id number] BY CASTC Performed at [facility]    Report Status 08/12/2014 FINAL  Final  Blood culture (routine x 2)     Status: None (Preliminary result)   Collection Time: 08/10/14  7:48 PM  Result Value Ref Range Status   Specimen Description BLOOD LEFT HAND  Final   Special Requests BOTTLES DRAWN AEROBIC AND ANAEROBIC 5CC  Final   Culture   Final           BLOOD CULTURE RECEIVED NO GROWTH TO DATE CULTURE WILL BE HELD FOR 5 DAYS BEFORE ISSUING A FINAL NEGATIVE REPORT Performed at [facility]    Report Status PENDING  Incomplete     Studies: Ct Abdomen Pelvis Wo  Contrast  08/10/2014   CLINICAL DATA:  weakness. States he has blood in his stool for a month. Pt. Also thought to had seizure today  EXAM: CT ABDOMEN AND PELVIS WITHOUT CONTRAST  TECHNIQUE: Multidetector CT imaging of the abdomen and pelvis was performed following the standard protocol without IV contrast.  COMPARISON:  08/17/2013 and earlier studies  FINDINGS: Minimal dependent atelectasis in the visualized lung bases right greater than left. Transvenous pacing leads are partially seen.  Fatty liver. Unremarkable spleen. Mild bilateral adrenal hyperplasia. Stable 5 mm cystic lesion in the pancreatic body image 24/2. Stable 2.2 cm right parapelvic renal cyst. No hydronephrosis. Residual contrast material in the renal collecting systems and urinary bladder from previous CT contrast administration. Stable right bladder diverticulum.  Stomach, small bowel, and colon are nondilated. Normal appendix. Scattered descending and proximal sigmoid colon diverticula without adjacent inflammatory/edematous change. Moderate enlargement of the prostate. No free air. No ascites. Mild spondylitic changes in the lower lumbar spine. No adenopathy localized.  IMPRESSION: 1. Descending and sigmoid diverticulosis without CT evidence of diverticulitis. 2. No evidence of mass or adenopathy. 3. Fatty liver 4. Stable right renal cyst  and small cystic pancreatic body lesion.   Electronically Signed   By: Lucrezia Europe M.D.   On: 08/10/2014 17:42   Ct Head Wo Contrast  08/10/2014   CLINICAL DATA:  Collapse with loss of consciousness.  EXAM: CT HEAD WITHOUT CONTRAST  TECHNIQUE: Contiguous axial images were obtained from the base of the skull through the vertex without intravenous contrast.  COMPARISON:  None.  FINDINGS: Mild atrophy. Mild chronic microvascular ischemic change in the white matter.  Negative for acute infarct, hemorrhage, mass lesion.  Calvarium is intact.  Chronic right medial orbital fracture.  IMPRESSION: Atrophy and  chronic microvascular ischemia.  No acute abnormality.   Electronically Signed   By: Franchot Gallo M.D.   On: 08/10/2014 14:18   Ct Angio Chest Pe W/cm &/or Wo Cm  08/10/2014   CLINICAL DATA:  Syncope  EXAM: CT ANGIOGRAPHY CHEST WITH CONTRAST  TECHNIQUE: Multidetector CT imaging of the chest was performed using the standard protocol during bolus administration of intravenous contrast. Multiplanar CT image reconstructions and MIPs were obtained to evaluate the vascular anatomy.  CONTRAST:  7mL OMNIPAQUE IOHEXOL 350 MG/ML SOLN  COMPARISON:  None.  FINDINGS: There are no filling defects in the pulmonary arterial tree to suggest acute pulmonary thromboembolism.  No abnormal mediastinal adenopathy. Dual lead pacemaker device is in place.  No pericardial effusion.  No pleural effusion.  No pneumothorax.  Dependent atelectasis in the lungs.  No mass or consolidation.  No vertebral compression deformity.  Bilateral gynecomastia.  Review of the MIP images confirms the above findings.  IMPRESSION: No evidence of acute pulmonary thromboembolism.   Electronically Signed   By: Marybelle Killings M.D.   On: 08/10/2014 14:25    Scheduled Meds: . diltiazem  240 mg Oral Daily  . enoxaparin (LOVENOX) injection  40 mg Subcutaneous Q24H  . flecainide  100 mg Oral BID  . hydrALAZINE  25 mg Oral 3 times per day  . pyridostigmine  60 mg Oral BID AC  . tamsulosin  0.4 mg Oral Daily   Continuous Infusions: . sodium chloride 50 mL/hr at 08/12/14 9983    Active Problems:   LOC (loss of consciousness)   Syncope   Elevated liver function tests   Orthostatic syncope    Time spent: 25 minutes.     [facility] Pager [phone]. If 7PM-7AM, please contact night-coverage at [email or web address], password [facility] 08/12/2014, 1:48 PM  LOS: 2 days

## 2014-08-12 NOTE — Progress Notes (Signed)
  Echocardiogram 2D Echocardiogram has been performed.  Jonathon [name] 08/12/2014, 10:32 AM

## 2014-08-12 NOTE — Progress Notes (Signed)
Patient trasfered from [facility] to 5W01 via wheelchair; alert and oriented x 4; no complaints of pain; IV saline locked in [place] RAC; skin intact. Orient patient to room and unit;instructed how to use the call bell and  fall risk precautions. Will continue to monitor the patient.

## 2014-08-12 NOTE — Progress Notes (Signed)
Patient Name: Jonathon [name]      SUBJECTIVE: feels better with less LH w standing  Past Medical History  Diagnosis Date  . BENIGN PROSTATIC HYPERTROPHY, HX OF 10/25/2008  . BRADYCARDIA 2005  . HYPERTENSION 11/21/2006  . 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 PACEMAKER, PERMANENT 2005    Gen change 2014 Medtronic Adaptic L dual-chamber pacemaker, serial [id number]   . TOBACCO ABUSE 10/25/2008    Quit 2012  . Iron deficiency anemia, unspecified 04/19/2013  . Asthma     Scheduled Meds:  Scheduled Meds: . diltiazem  240 mg Oral Daily  . enoxaparin (LOVENOX) injection  40 mg Subcutaneous Q24H  . flecainide  100 mg Oral BID  . potassium chloride  40 mEq Oral Once  . pyridostigmine  60 mg Oral BID AC  . tamsulosin  0.4 mg Oral Daily  . vancomycin  750 mg Intravenous Q12H   Continuous Infusions: . sodium chloride 50 mL/hr at 08/12/14 0237   zolpidem    PHYSICAL EXAM Filed Vitals:   08/11/14 1601 08/11/14 2000 08/11/14 2327 08/12/14 0427  BP: 177/103 141/97 120/70 141/86  Pulse: 111 78 66 66  Temp: 98.6 F (37 C) 98.5 F (36.9 C) 98.5 F (36.9 C) 98.5 F (36.9 C)  TempSrc: Oral Oral Oral Oral  Resp: 21 24 28 18   Height:      Weight:      SpO2: 95% 99% 97% 99%    Well developed and nourished in no acute distress HENT normal Neck supple with JVP-flat Clear Regular rate and rhythm, no murmurs or gallops Abd-soft with active BS No Clubbing cyanosis edema Skin-warm and dry A & Oriented  Grossly normal sensory and motor function   TELEMETRY: Reviewed telemetry pt in nsr:    Intake/Output Summary (Last 24 hours) at 08/12/14 0751 Last data filed at 08/12/14 0600  Gross per 24 hour  Intake 2113.33 ml  Output    400 ml  Net 1713.33 ml    LABS: Basic Metabolic Panel:  Recent Labs Lab 08/10/14 1227 08/10/14 1232 08/10/14 1432 08/10/14 1948 08/11/14 0305 08/12/14 0333  NA 140 139  --   --  141 136  K 5.3* 5.4* 4.7   --  4.9 3.3*  CL 104 104  --   --  106 104  CO2 22  --   --   --  23 26  GLUCOSE 105* 107*  --   --  84 91  BUN 12 16  --   --  13 11  CREATININE 1.22 1.40*  --  1.14 1.27 0.84  CALCIUM 8.7  --   --   --  8.2* 8.0*   Cardiac Enzymes:  Recent Labs  08/10/14 1227 08/11/14 0305  CKTOTAL  --  72  TROPONINI <0.03  --    CBC:  Recent Labs Lab 08/10/14 1227 08/10/14 1232 08/10/14 1948 08/11/14 0305 08/12/14 0333  WBC 4.4  --  5.0 4.2 2.8*  NEUTROABS 2.0  --   --   --   --   HGB 12.0* 13.6 11.4* 10.3* 9.6*  HCT 35.5* 40.0 33.2* 31.1* 28.6*  MCV 86.0  --  85.3 85.9 85.6  PLT 136*  --  117* 104* 97*   PROTIME:  Recent Labs  08/10/14 1227  LABPROT 13.7  INR 1.04   Liver Function Tests:  Recent Labs  08/10/14 1227 08/11/14 0305  AST 147* 94*  ALT 72* 62*  ALKPHOS 103 90  BILITOT 1.5* 1.6*  PROT 6.7 6.1  ALBUMIN 3.1* 2.8*   No results for input(s): LIPASE, AMYLASE in the last 72 hours. BNP: BNP (last 3 results)  Recent Labs  08/10/14 1227  BNP 36.0    ProBNP (last 3 results) No results for input(s): PROBNP in the last 8760 hours.  D-Dimer: No results for input(s): DDIMER in the last 72 hours. Hemoglobin A1C: No results for input(s): HGBA1C in the last 72 hours. Fasting Lipid Panel: No results for input(s): CHOL, HDL, LDLCALC, TRIG, CHOLHDL, LDLDIRECT in the last 72 hours. Thyroid Function Tests:  Recent Labs  08/10/14 1940  TSH 0.875   Anemia Panel:  Recent Labs  08/12/14 0333  RETICCTPCT 1.2        ASSESSMENT AND PLAN:  Active Problems: Orthostatic Hypotension Atrial Tachycardia Hypertension Pacemaker Anemia ?   Continue hydralazine for BP   Assoc with volume retention which might mitigate some of his symptoms  Hopefully with ambulation today and tomorrow can be discharged over the weekend  Echo reordered  Signed, Virl Axe MD  08/12/2014

## 2014-08-13 LAB — BASIC METABOLIC PANEL
Anion gap: 5 (ref 5–15)
BUN: 8 mg/dL (ref 6–23)
CALCIUM: 8 mg/dL — AB (ref 8.4–10.5)
CO2: 24 mmol/L (ref 19–32)
CREATININE: 0.88 mg/dL (ref 0.50–1.35)
Chloride: 104 mmol/L (ref 96–112)
GFR calc Af Amer: >90 mL/min (ref >90)
GFR, EST NON AFRICAN AMERICAN: 81 mL/min — AB (ref >90)
Glucose, Bld: 91 mg/dL (ref 70–99)
Potassium: 3.4 mmol/L — ABNORMAL LOW (ref 3.5–5.1)
Sodium: 133 mmol/L — ABNORMAL LOW (ref 135–145)

## 2014-08-13 MED ORDER — SODIUM CHLORIDE 0.9 % IV SOLN
INTRAVENOUS | Status: DC
  Administered 2014-08-13 – 2014-08-14 (×3): via INTRAVENOUS

## 2014-08-13 MED ORDER — POTASSIUM CHLORIDE CRYS ER 20 MEQ PO TBCR
40.0000 meq | EXTENDED_RELEASE_TABLET | Freq: Two times a day (BID) | ORAL | Status: AC
  Administered 2014-08-13 – 2014-08-14 (×2): 40 meq via ORAL
  Filled 2014-08-13 (×2): qty 2

## 2014-08-13 NOTE — Progress Notes (Signed)
[facility] PROGRESS NOTE  DUVAL MACLEOD SVX:[id number] DOB: [birth year] DOA: 08/10/2014 PCP: Nyoka Cowden, MD Interim summary: 78 y old male admitted for syncope.  Assessment/Plan: 1. 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  Am cortisol level is 22.6. He was initially admitted to step down and started on IV fluids. His orthostatics are positive this am. he reports dizziness and weakness. He was started on IV fluids and ted hoses. Cardiology and neurology consulted and recommendations given.  EEG ordered and was found to be negative for epileptiform activity.  Echocardiogram ordered shows LVEF of 60 to 65% with no regional wall abnormalities.     Bradycardia and s/p PPM : interrogated and did not show any significant cardiac arrhythmias .  Resume flecainide. Echocardiogram done does not show any regional wall abnormalities.    Hypertension: BP parameters are much better.    BPH: Resume flomax.    elevated liver function tests: Asymptomatic. Improved from yesterday.    Mild anemia and thrombocytopenia: Unclear etiology.  Get anemia panel and smear. Anemia panel reveals normal ferritin, iron levels and normal b12 and folate levels.  Probably anemia of chronic disease. His retic count is also within normal. Limits.   Hypokalemia: replete as needed.  Repeat level slightly better. Repeat ina m.   Coag negative staph positive in one blood culture bottle, probably a contaminant. Initially he was started on IV vancomycin and this was discontinued earlier this am.    Code Status: full code.  Family Communication: family at bedside Disposition Plan: pending PT eval.    Consultants:  [facility].   Procedures:  EEG  Antibiotics:  none  HPI/Subjective: Reports dizziness.   Objective: Filed Vitals:   08/13/14 1338  BP: 125/72  Pulse: 93  Temp: 99.9 F (37.7 C)  Resp: 20     Intake/Output Summary (Last 24 hours) at 08/13/14 1750 Last data filed at 08/13/14 1300  Gross per 24 hour  Intake    702 ml  Output    150 ml  Net    552 ml   Filed Weights   08/10/14 1222 08/10/14 1700  Weight: 82.555 kg (182 lb) 81.1 kg (178 lb 12.7 oz)    Exam:   General:  Alert afebrile comfortable sitting in the chair  Cardiovascular: s1s2  Respiratory: clear to ausculatation, no wheezing or rhonchi  Abdomen: soft non tender non distended bowel sounds heard  Musculoskeletal: no pedal edema.   Data Reviewed: Basic Metabolic Panel:  Recent Labs Lab 08/10/14 1227 08/10/14 1232 08/10/14 1432 08/10/14 1948 08/11/14 0305 08/12/14 0333 08/13/14 0540  NA 140 139  --   --  141 136 133*  K 5.3* 5.4* 4.7  --  4.9 3.3* 3.4*  CL 104 104  --   --  106 104 104  CO2 22  --   --   --  23 26 24   GLUCOSE 105* 107*  --   --  84 91 91  BUN 12 16  --   --  13 11 8   CREATININE 1.22 1.40*  --  1.14 1.27 0.84 0.88  CALCIUM 8.7  --   --   --  8.2* 8.0* 8.0*   Liver Function Tests:  Recent Labs Lab 08/10/14 1227 08/11/14 0305 08/12/14 1030  AST 147* 94* 71*  ALT 72* 62* 46  ALKPHOS 103 90 77  BILITOT 1.5* 1.6* 1.0  PROT 6.7 6.1 5.9*  ALBUMIN 3.1* 2.8* 2.5*  No results for input(s): LIPASE, AMYLASE in the last 168 hours. No results for input(s): AMMONIA in the last 168 hours. CBC:  Recent Labs Lab 08/10/14 1227 08/10/14 1232 08/10/14 1948 08/11/14 0305 08/12/14 0333  WBC 4.4  --  5.0 4.2 2.8*  NEUTROABS 2.0  --   --   --   --   HGB 12.0* 13.6 11.4* 10.3* 9.6*  HCT 35.5* 40.0 33.2* 31.1* 28.6*  MCV 86.0  --  85.3 85.9 85.6  PLT 136*  --  117* 104* 97*   Cardiac Enzymes:  Recent Labs Lab 08/10/14 1227 08/11/14 0305  CKTOTAL  --  72  TROPONINI <0.03  --    BNP (last 3 results)  Recent Labs  08/10/14 1227  BNP 36.0    ProBNP (last 3 results) No results for input(s): PROBNP in the last 8760 hours.  CBG:  Recent Labs Lab 08/10/14 1256   GLUCAP 95    Recent Results (from the past 240 hour(s))  Urine culture     Status: None   Collection Time: 08/10/14  4:53 PM  Result Value Ref Range Status   Specimen Description URINE, CLEAN CATCH  Final   Special Requests NONE  Final   Colony Count NO GROWTH Performed at [facility]   Final   Culture NO GROWTH Performed at [facility]   Final   Report Status 08/11/2014 FINAL  Final  MRSA PCR Screening     Status: None   Collection Time: 08/10/14  6:15 PM  Result Value Ref Range Status   MRSA by PCR NEGATIVE NEGATIVE Final    Comment:        The GeneXpert MRSA Assay (FDA approved for NASAL specimens only), is one component of a comprehensive MRSA colonization surveillance program. It is not intended to diagnose MRSA infection nor to guide or monitor treatment for MRSA infections.   Blood culture (routine x 2)     Status: None   Collection Time: 08/10/14  7:40 PM  Result Value Ref Range Status   Specimen Description BLOOD RIGHT HAND  Final   Special Requests BOTTLES DRAWN AEROBIC ONLY 4CC  Final   Culture   Final    STAPHYLOCOCCUS SPECIES (COAGULASE NEGATIVE) Note: THE SIGNIFICANCE OF ISOLATING THIS ORGANISM FROM A SINGLE SET OF BLOOD CULTURES WHEN MULTIPLE SETS ARE DRAWN IS UNCERTAIN. PLEASE NOTIFY THE MICROBIOLOGY DEPARTMENT WITHIN ONE WEEK IF SPECIATION AND SENSITIVITIES ARE REQUIRED. Note: Gram Stain Report Called to,Read Back By and Verified With: [place] [id number] BY CASTC Performed at [facility]    Report Status 08/12/2014 FINAL  Final  Blood culture (routine x 2)     Status: None (Preliminary result)   Collection Time: 08/10/14  7:48 PM  Result Value Ref Range Status   Specimen Description BLOOD LEFT HAND  Final   Special Requests BOTTLES DRAWN AEROBIC AND ANAEROBIC 5CC  Final   Culture   Final           BLOOD CULTURE RECEIVED NO GROWTH TO DATE CULTURE WILL BE HELD FOR 5 DAYS BEFORE ISSUING A FINAL NEGATIVE  REPORT Performed at [facility]    Report Status PENDING  Incomplete     Studies: No results found.  Scheduled Meds: . diltiazem  240 mg Oral Daily  . enoxaparin (LOVENOX) injection  40 mg Subcutaneous Q24H  . flecainide  100 mg Oral BID  . hydrALAZINE  25 mg Oral 3 times per day  .  potassium chloride  40 mEq Oral BID  . pyridostigmine  60 mg Oral BID AC  . tamsulosin  0.4 mg Oral Daily   Continuous Infusions: . sodium chloride 75 mL/hr at 08/13/14 1706    Active Problems:   LOC (loss of consciousness)   Syncope   Elevated liver function tests   Orthostatic syncope    Time spent: 25 minutes.     [facility] Pager [phone]. If 7PM-7AM, please contact night-coverage at [email or web address], password [facility] 08/13/2014, 5:50 PM  LOS: 3 days

## 2014-08-13 NOTE — Progress Notes (Signed)
    SUBJECTIVE:  He is weak but in no distress.  EKG done yesterday because of ectopy and possible ST elevation.  However, RBBB and no acute changes noted.    PHYSICAL EXAM Filed Vitals:   08/12/14 1541 08/12/14 2132 08/13/14 0501 08/13/14 0713  BP: 140/84 147/78 149/97 160/75  Pulse:    90  Temp:  99.9 F (37.7 C) 98.6 F (37 C)   TempSrc:  Oral Oral   Resp:  20 19   Height:      Weight:      SpO2:  97% 97% 98%   General:  No distress Lungs:  Clear Heart:  RRR Abdomen:  Positive bowel sounds, no rebound no guarding Extremities:  No edema   LABS:  Results for orders placed or performed during the hospital encounter of 08/10/14 (from the past 24 hour(s))  Hepatic function panel     Status: Abnormal   Collection Time: 08/12/14 10:30 AM  Result Value Ref Range   Total Protein 5.9 (L) 6.0 - 8.3 g/dL   Albumin 2.5 (L) 3.5 - 5.2 g/dL   AST 71 (H) 0 - 37 U/L   ALT 46 0 - 53 U/L   Alkaline Phosphatase 77 39 - 117 U/L   Total Bilirubin 1.0 0.3 - 1.2 mg/dL   Bilirubin, Direct 0.3 0.0 - 0.5 mg/dL   Indirect Bilirubin 0.7 0.3 - 0.9 mg/dL  Occult blood card to lab, stool     Status: None   Collection Time: 08/12/14  1:10 PM  Result Value Ref Range   Fecal Occult Bld NEGATIVE NEGATIVE  Basic metabolic panel     Status: Abnormal   Collection Time: 08/13/14  5:40 AM  Result Value Ref Range   Sodium 133 (L) 135 - 145 mmol/L   Potassium 3.4 (L) 3.5 - 5.1 mmol/L   Chloride 104 96 - 112 mmol/L   CO2 24 19 - 32 mmol/L   Glucose, Bld 91 70 - 99 mg/dL   BUN 8 6 - 23 mg/dL   Creatinine, Ser 0.88 0.50 - 1.35 mg/dL   Calcium 8.0 (L) 8.4 - 10.5 mg/dL   GFR calc non Af Amer 81 (L) >90 mL/min   GFR calc Af Amer >90 >90 mL/min   Anion gap 5 5 - 15    Intake/Output Summary (Last 24 hours) at 08/13/14 0956 Last data filed at 08/13/14 0859  Gross per 24 hour  Intake   1090 ml  Output    330 ml  Net    760 ml    ASSESSMENT AND PLAN:  Orthostatic syncope:  He did have an episode of  tachycardia last night on tele and I reviewed this.  However, this appeared to be sinus tach.  He was likely up and moving shortly before this.  No change in therapy.  Still with significant orthostatic drop but has abdominal binder and at should get Ted hose at discharge.     Jeneen Rinks [facility] 08/13/2014 9:56 AM

## 2014-08-14 LAB — BASIC METABOLIC PANEL
ANION GAP: 7 (ref 5–15)
BUN: <5 mg/dL — ABNORMAL LOW (ref 6–23)
CO2: 22 mmol/L (ref 19–32)
Calcium: 8.8 mg/dL (ref 8.4–10.5)
Chloride: 105 mmol/L (ref 96–112)
Creatinine, Ser: 0.91 mg/dL (ref 0.50–1.35)
GFR, EST NON AFRICAN AMERICAN: 80 mL/min — AB (ref >90)
Glucose, Bld: 119 mg/dL — ABNORMAL HIGH (ref 70–99)
Potassium: 4 mmol/L (ref 3.5–5.1)
Sodium: 134 mmol/L — ABNORMAL LOW (ref 135–145)

## 2014-08-14 NOTE — Progress Notes (Signed)
[facility] PROGRESS NOTE  Jonathon [name] PYK:[id number] DOB: 1936/03/20 DOA: 08/10/2014 PCP: Nyoka Cowden, MD Interim summary: 28 y old male admitted for syncope.  Assessment/Plan: 1. 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  Am cortisol level is 22.6. He was initially admitted to step down and started on IV fluids. His orthostatics are negative. He was given TED hoses Cardiology and neurology consulted and recommendations given.  EEG ordered and was found to be negative for epileptiform activity.  Echocardiogram ordered shows LVEF of 60 to 65% with no regional wall abnormalities.     Bradycardia and s/p PPM : interrogated and did not show any significant cardiac arrhythmias .  Resume flecainide. Echocardiogram done does not show any regional wall abnormalities.    Hypertension: BP parameters are much better.    BPH: Resume flomax.    elevated liver function tests: Asymptomatic. Improved from yesterday.    Mild anemia and thrombocytopenia: Unclear etiology.  anemia panel and smear. Anemia panel reveals normal ferritin, iron levels and normal b12 and folate levels.  Probably anemia of chronic disease. His retic count is also within normal. Limits.   Hypokalemia: replete as needed.  Repeat level slightly better. Repeat ina m is normal .   Coag negative staph positive in one blood culture bottle, probably a contaminant. Initially he was started on IV vancomycin and this was discontinued earlier this am.    Code Status: full code.  Family Communication: family at bedside Disposition Plan:SNF when bed available.    Consultants:  [facility].   Procedures:  EEG  Antibiotics:  none  HPI/Subjective: No new complaints.   Objective: Filed Vitals:   08/14/14 0517  BP: 133/74  Pulse: 85  Temp: 98.5 F (36.9 C)  Resp: 20    Intake/Output Summary (Last 24 hours) at 08/14/14  1931 Last data filed at 08/14/14 1300  Gross per 24 hour  Intake 1563.75 ml  Output    775 ml  Net 788.75 ml   Filed Weights   08/10/14 1222 08/10/14 1700  Weight: 82.555 kg (182 lb) 81.1 kg (178 lb 12.7 oz)    Exam:   General:  Alert afebrile comfortable sitting in the chair  Cardiovascular: s1s2  Respiratory: clear to ausculatation, no wheezing or rhonchi  Abdomen: soft non tender non distended bowel sounds heard  Musculoskeletal: no pedal edema.   Data Reviewed: Basic Metabolic Panel:  Recent Labs Lab 08/10/14 1227 08/10/14 1232 08/10/14 1432 08/10/14 1948 08/11/14 0305 08/12/14 0333 08/13/14 0540 08/14/14 1452  NA 140 139  --   --  141 136 133* 134*  K 5.3* 5.4* 4.7  --  4.9 3.3* 3.4* 4.0  CL 104 104  --   --  106 104 104 105  CO2 22  --   --   --  23 26 24 22   GLUCOSE 105* 107*  --   --  84 91 91 119*  BUN 12 16  --   --  13 11 8  <5*  CREATININE 1.22 1.40*  --  1.14 1.27 0.84 0.88 0.91  CALCIUM 8.7  --   --   --  8.2* 8.0* 8.0* 8.8   Liver Function Tests:  Recent Labs Lab 08/10/14 1227 08/11/14 0305 08/12/14 1030  AST 147* 94* 71*  ALT 72* 62* 46  ALKPHOS 103 90 77  BILITOT 1.5* 1.6* 1.0  PROT 6.7 6.1 5.9*  ALBUMIN 3.1* 2.8* 2.5*   No results for input(s):  LIPASE, AMYLASE in the last 168 hours. No results for input(s): AMMONIA in the last 168 hours. CBC:  Recent Labs Lab 08/10/14 1227 08/10/14 1232 08/10/14 1948 08/11/14 0305 08/12/14 0333  WBC 4.4  --  5.0 4.2 2.8*  NEUTROABS 2.0  --   --   --   --   HGB 12.0* 13.6 11.4* 10.3* 9.6*  HCT 35.5* 40.0 33.2* 31.1* 28.6*  MCV 86.0  --  85.3 85.9 85.6  PLT 136*  --  117* 104* 97*   Cardiac Enzymes:  Recent Labs Lab 08/10/14 1227 08/11/14 0305  CKTOTAL  --  72  TROPONINI <0.03  --    BNP (last 3 results)  Recent Labs  08/10/14 1227  BNP 36.0    ProBNP (last 3 results) No results for input(s): PROBNP in the last 8760 hours.  CBG:  Recent Labs Lab 08/10/14 1256   GLUCAP 95    Recent Results (from the past 240 hour(s))  Urine culture     Status: None   Collection Time: 08/10/14  4:53 PM  Result Value Ref Range Status   Specimen Description URINE, CLEAN CATCH  Final   Special Requests NONE  Final   Colony Count NO GROWTH Performed at [facility]   Final   Culture NO GROWTH Performed at [facility]   Final   Report Status 08/11/2014 FINAL  Final  MRSA PCR Screening     Status: None   Collection Time: 08/10/14  6:15 PM  Result Value Ref Range Status   MRSA by PCR NEGATIVE NEGATIVE Final    Comment:        The GeneXpert MRSA Assay (FDA approved for NASAL specimens only), is one component of a comprehensive MRSA colonization surveillance program. It is not intended to diagnose MRSA infection nor to guide or monitor treatment for MRSA infections.   Blood culture (routine x 2)     Status: None   Collection Time: 08/10/14  7:40 PM  Result Value Ref Range Status   Specimen Description BLOOD RIGHT HAND  Final   Special Requests BOTTLES DRAWN AEROBIC ONLY 4CC  Final   Culture   Final    STAPHYLOCOCCUS SPECIES (COAGULASE NEGATIVE) Note: THE SIGNIFICANCE OF ISOLATING THIS ORGANISM FROM A SINGLE SET OF BLOOD CULTURES WHEN MULTIPLE SETS ARE DRAWN IS UNCERTAIN. PLEASE NOTIFY THE MICROBIOLOGY DEPARTMENT WITHIN ONE WEEK IF SPECIATION AND SENSITIVITIES ARE REQUIRED. Note: Gram Stain Report Called to,Read Back By and Verified With: [place] [id number] BY CASTC Performed at [facility]    Report Status 08/12/2014 FINAL  Final  Blood culture (routine x 2)     Status: None (Preliminary result)   Collection Time: 08/10/14  7:48 PM  Result Value Ref Range Status   Specimen Description BLOOD LEFT HAND  Final   Special Requests BOTTLES DRAWN AEROBIC AND ANAEROBIC 5CC  Final   Culture   Final           BLOOD CULTURE RECEIVED NO GROWTH TO DATE CULTURE WILL BE HELD FOR 5 DAYS BEFORE ISSUING A FINAL NEGATIVE  REPORT Performed at [facility]    Report Status PENDING  Incomplete     Studies: No results found.  Scheduled Meds: . diltiazem  240 mg Oral Daily  . enoxaparin (LOVENOX) injection  40 mg Subcutaneous Q24H  . flecainide  100 mg Oral BID  . hydrALAZINE  25 mg Oral 3 times per day  . pyridostigmine  60  mg Oral BID AC  . tamsulosin  0.4 mg Oral Daily   Continuous Infusions:    Active Problems:   LOC (loss of consciousness)   Syncope   Elevated liver function tests   Orthostatic syncope    Time spent: 25 minutes.     [facility] Pager [phone]. If 7PM-7AM, please contact night-coverage at [email or web address], password [facility] 08/14/2014, 7:31 PM  LOS: 4 days

## 2014-08-14 NOTE — Evaluation (Signed)
Physical Therapy Evaluation Patient Details Name: Jonathon [name] MRN: [id number] DOB: 03-Jul-1935 Today's Date: 08/14/2014   History of Present Illness  PtAdmit with syncope.    Clinical Impression  Pt admitted with above diagnosis. Pt currently with functional limitations due to the deficits listed below (see PT Problem List). Pt will need SNF at d/c as he is needing some assist at present with balance.  Pt agreeable.  Will need RW for balance as well.  Will follow acutely.    Pt will benefit from skilled PT to increase their independence and safety with mobility to allow discharge to the venue listed below.     Follow Up Recommendations Supervision/Assistance - 24 hour;SNF    Equipment Recommendations  Rolling walker with 5" wheels    Recommendations for Other Services       Precautions / Restrictions Precautions Precautions: Fall Restrictions Weight Bearing Restrictions: No      Mobility  Bed Mobility               General bed mobility comments: In chair on arrival.    Transfers Overall transfer level: Needs assistance Equipment used: Rolling walker (2 wheeled) Transfers: Sit to/from Stand Sit to Stand: Min guard         General transfer comment: Pt needed steadying assist upon standing.    Ambulation/Gait Ambulation/Gait assistance: Min assist;Min guard Ambulation Distance (Feet): 250 Feet Assistive device: Rolling walker (2 wheeled) Gait Pattern/deviations: Step-through pattern;Decreased stride length   Gait velocity interpretation: Below normal speed for age/gender General Gait Details: Pt ambulates well with RW.  Slightly shaky at times.  Pt was able to maintain steadiness with RW overall but did have occasional LOB needing steadying assist.    Stairs            Wheelchair Mobility    Modified Rankin (Stroke Patients Only)       Balance Overall balance assessment: Needs assistance;History of Falls         Standing balance support:  Bilateral upper extremity supported;During functional activity Standing balance-Leahy Scale: Poor Standing balance comment: Pt needed RW for balance.               High level balance activites: Direction changes;Turns;Sudden stops High Level Balance Comments: Pt able to ambulate with challenges with balance with RW needing min assist for balance.  Cannot withstand challenges without RW.  Standardized Balance Assessment Standardized Balance Assessment : Dynamic Gait Index   Dynamic Gait Index Level Surface: Mild Impairment Change in Gait Speed: Mild Impairment Gait with Horizontal Head Turns: Mild Impairment Gait with Vertical Head Turns: Mild Impairment Gait and Pivot Turn: Mild Impairment Step Over Obstacle: Moderate Impairment Step Around Obstacles: Mild Impairment Steps: Moderate Impairment Total Score: 14       Pertinent Vitals/Pain Pain Assessment: No/denies pain  VSS    Home Living Family/patient expects to be discharged to:: Private residence Living Arrangements: Alone Available Help at Discharge: Family;Available PRN/intermittently Type of Home: House Home Access: Stairs to enter Entrance Stairs-Rails: Can reach both;Left;Right Entrance Stairs-Number of Steps: 3 Home Layout: One level Home Equipment: None      Prior Function Level of Independence: Independent               Hand Dominance        Extremity/Trunk Assessment   Upper Extremity Assessment: Defer to OT evaluation           Lower Extremity Assessment: Generalized weakness  Communication   Communication: No difficulties  Cognition Arousal/Alertness: Awake/alert Behavior During Therapy: WFL for tasks assessed/performed Overall Cognitive Status: Within Functional Limits for tasks assessed                      General Comments General comments (skin integrity, edema, etc.): Pt scored 14/24 on DGI suggesting pt at risk for falls without RW.      Exercises         Assessment/Plan    PT Assessment Patient needs continued PT services  PT Diagnosis Generalized weakness   PT Problem List Decreased activity tolerance;Decreased balance;Decreased mobility;Decreased knowledge of use of DME;Decreased safety awareness;Decreased knowledge of precautions  PT Treatment Interventions DME instruction;Gait training;Stair training;Functional mobility training;Therapeutic activities;Therapeutic exercise;Balance training;Patient/family education   PT Goals (Current goals can be found in the Care Plan section) Acute Rehab PT Goals Patient Stated Goal: to go home PT Goal Formulation: With patient Time For Goal Achievement: 08/21/14 Potential to Achieve Goals: Good    Frequency Min 3X/week   Barriers to discharge Decreased caregiver support family all works    Co-evaluation               End of [place] During Treatment: Gait belt Activity Tolerance: Patient tolerated treatment well Patient left: in chair;with call bell/phone within reach Nurse Communication: Mobility status         Time: 7416-3845 PT Time Calculation (min) (ACUTE ONLY): 14 min   Charges:   PT Evaluation $Initial PT Evaluation Tier I: 1 Procedure     PT G CodesDenice Paradise 31-Aug-2014, 12:58 PM Eiliyah Reh [facility] Acute Rehabilitation [phone] (pager)

## 2014-08-15 DIAGNOSIS — G609 Hereditary and idiopathic neuropathy, unspecified: Secondary | ICD-10-CM | POA: Diagnosis not present

## 2014-08-15 DIAGNOSIS — R945 Abnormal results of liver function studies: Secondary | ICD-10-CM | POA: Diagnosis not present

## 2014-08-15 DIAGNOSIS — R7989 Other specified abnormal findings of blood chemistry: Secondary | ICD-10-CM | POA: Diagnosis not present

## 2014-08-15 DIAGNOSIS — D509 Iron deficiency anemia, unspecified: Secondary | ICD-10-CM

## 2014-08-15 DIAGNOSIS — R29898 Other symptoms and signs involving the musculoskeletal system: Secondary | ICD-10-CM | POA: Diagnosis not present

## 2014-08-15 DIAGNOSIS — R001 Bradycardia, unspecified: Secondary | ICD-10-CM | POA: Diagnosis not present

## 2014-08-15 DIAGNOSIS — R279 Unspecified lack of coordination: Secondary | ICD-10-CM | POA: Diagnosis not present

## 2014-08-15 DIAGNOSIS — M6281 Muscle weakness (generalized): Secondary | ICD-10-CM | POA: Diagnosis not present

## 2014-08-15 DIAGNOSIS — D519 Vitamin B12 deficiency anemia, unspecified: Secondary | ICD-10-CM | POA: Diagnosis not present

## 2014-08-15 DIAGNOSIS — R2689 Other abnormalities of gait and mobility: Secondary | ICD-10-CM | POA: Diagnosis not present

## 2014-08-15 DIAGNOSIS — E291 Testicular hypofunction: Secondary | ICD-10-CM | POA: Diagnosis not present

## 2014-08-15 DIAGNOSIS — G6181 Chronic inflammatory demyelinating polyneuritis: Secondary | ICD-10-CM | POA: Diagnosis not present

## 2014-08-15 DIAGNOSIS — R404 Transient alteration of awareness: Secondary | ICD-10-CM | POA: Diagnosis not present

## 2014-08-15 DIAGNOSIS — D72819 Decreased white blood cell count, unspecified: Secondary | ICD-10-CM | POA: Diagnosis not present

## 2014-08-15 DIAGNOSIS — G7000 Myasthenia gravis without (acute) exacerbation: Secondary | ICD-10-CM | POA: Diagnosis not present

## 2014-08-15 DIAGNOSIS — D638 Anemia in other chronic diseases classified elsewhere: Secondary | ICD-10-CM | POA: Diagnosis not present

## 2014-08-15 DIAGNOSIS — E46 Unspecified protein-calorie malnutrition: Secondary | ICD-10-CM | POA: Diagnosis not present

## 2014-08-15 DIAGNOSIS — R600 Localized edema: Secondary | ICD-10-CM | POA: Diagnosis not present

## 2014-08-15 DIAGNOSIS — N400 Enlarged prostate without lower urinary tract symptoms: Secondary | ICD-10-CM | POA: Diagnosis not present

## 2014-08-15 DIAGNOSIS — D649 Anemia, unspecified: Secondary | ICD-10-CM | POA: Diagnosis not present

## 2014-08-15 DIAGNOSIS — I10 Essential (primary) hypertension: Secondary | ICD-10-CM | POA: Diagnosis not present

## 2014-08-15 DIAGNOSIS — D472 Monoclonal gammopathy: Secondary | ICD-10-CM | POA: Diagnosis not present

## 2014-08-15 DIAGNOSIS — D696 Thrombocytopenia, unspecified: Secondary | ICD-10-CM | POA: Diagnosis not present

## 2014-08-15 DIAGNOSIS — I951 Orthostatic hypotension: Secondary | ICD-10-CM | POA: Diagnosis not present

## 2014-08-15 DIAGNOSIS — I950 Idiopathic hypotension: Secondary | ICD-10-CM | POA: Diagnosis not present

## 2014-08-15 DIAGNOSIS — Z950 Presence of cardiac pacemaker: Secondary | ICD-10-CM | POA: Diagnosis not present

## 2014-08-15 DIAGNOSIS — G610 Guillain-Barre syndrome: Secondary | ICD-10-CM | POA: Diagnosis not present

## 2014-08-15 DIAGNOSIS — R55 Syncope and collapse: Secondary | ICD-10-CM | POA: Diagnosis not present

## 2014-08-15 MED ORDER — FLECAINIDE ACETATE 100 MG PO TABS: 100.0000 mg | ORAL_TABLET | Freq: Two times a day (BID) | ORAL | Status: DC

## 2014-08-15 MED ORDER — PYRIDOSTIGMINE BROMIDE 60 MG PO TABS: 60.0000 mg | ORAL_TABLET | Freq: Two times a day (BID) | ORAL | Status: DC

## 2014-08-15 MED ORDER — HYDRALAZINE HCL 25 MG PO TABS: 25.0000 mg | ORAL_TABLET | Freq: Three times a day (TID) | ORAL | Status: DC

## 2014-08-15 NOTE — Discharge Summary (Signed)
Physician Discharge Summary  TRELYN VANDERLINDE JJO:[id number] DOB: [birth year] DOA: 08/10/2014  PCP: Nyoka Cowden, MD  Admit date: 08/10/2014 Discharge date: 08/15/2014  Time spent: 30 minutes  Recommendations for Outpatient Follow-up:  1. Follow up with neurology and cardiology as recommended. 2. Follow up of anemia and thrombocytopenia.    Discharge Diagnoses:  Active Problems:   LOC (loss of consciousness)   Syncope   Elevated liver function tests   Orthostatic syncope   Discharge Condition: improved.   Diet recommendation: low sodium diet  Filed Weights   08/10/14 1222 08/10/14 1700  Weight: 82.555 kg (182 lb) 81.1 kg (178 lb 12.7 oz)    History of present illness:  38 y old male admitted for syncope.   Hospital Course:  1. 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 Am cortisol level is 22.6. He was initially admitted to step down and started on IV fluids. His orthostatics are negative. He was given TED hoses Cardiology and neurology consulted and recommendations given.  EEG ordered and was found to be negative for epileptiform activity.  Echocardiogram ordered shows LVEF of 60 to 65% with no regional wall abnormalities.     Bradycardia and s/p PPM : interrogated and did not show any significant cardiac arrhythmias .  Resume flecainide. Echocardiogram done does not show any regional wall abnormalities.    Hypertension: BP parameters are much better.    BPH: Resume flomax.    elevated liver function tests: Asymptomatic. Improved from yesterday.    Mild anemia and thrombocytopenia: Unclear etiology.  Anemia panel reveals normal ferritin, iron levels and normal b12 and folate levels.  Probably anemia of chronic disease. His retic count is also within normal. Limits.   Hypokalemia: replete as needed.   Repeat in am is normal .   Coag negative staph positive in one blood culture bottle,  probably a contaminant. Initially he was started on IV vancomycin and this was discontinued earlier this am.   Procedures:  none  Consultations:  Neurology and cardiology  Discharge Exam: Filed Vitals:   08/15/14 1523  BP: 148/67  Pulse: 89  Temp: 98.4 F (36.9 C)  Resp: 17    General: alert afebrile comfortable Cardiovascular: s1s2 Respiratory: ctab  Discharge Instructions   Discharge Instructions    Diet - low sodium heart healthy    Complete by:  As directed      Discharge instructions    Complete by:  As directed   Follow u with PCP in 1 to 2 weeks.  Follow up with neurology as recommended Follow u with cardiology in 1 week.          Current Discharge Medication List    START taking these medications   Details  hydrALAZINE (APRESOLINE) 25 MG tablet Take 1 tablet (25 mg total) by mouth every 8 (eight) hours.    pyridostigmine (MESTINON) 60 MG tablet Take 1 tablet (60 mg total) by mouth 2 (two) times daily before a meal.      CONTINUE these medications which have CHANGED   Details  flecainide (TAMBOCOR) 100 MG tablet Take 1 tablet (100 mg total) by mouth 2 (two) times daily.      CONTINUE these medications which have NOT CHANGED   Details  Cyanocobalamin (VITAMIN B 12 PO) Take 1,000 mg by mouth daily.    diltiazem (CARDIZEM LA) 240 MG 24 hr tablet Take 1 tablet (240 mg total) by mouth daily. Qty: 90 tablet, Refills: 3  Garlic 0630 MG CAPS Take 2,000 mg by mouth daily at 12 noon.    Multiple Vitamins-Minerals (CENTRUM ADULTS PO) Take by mouth.    Omega-3 Fatty Acids (FISH OIL) 1000 MG CAPS Take by mouth every morning.    tamsulosin (FLOMAX) 0.4 MG CAPS capsule TAKE 1 CAPSULE BY MOUTH DAILY Qty: 90 capsule, Refills: 1    testosterone (ANDROGEL) 50 MG/5GM (1%) GEL APPLY 1 PACKET ONTO THE SKIN ONCE DAILY AS DIRECTED Qty: 150 g, Refills: 0      STOP taking these medications     naproxen sodium (ANAPROX) 220 MG tablet        No Known  Allergies    The results of significant diagnostics from this hospitalization (including imaging, microbiology, ancillary and laboratory) are listed below for reference.    Significant Diagnostic Studies: Ct Abdomen Pelvis Wo Contrast  08/10/2014   CLINICAL DATA:  weakness. States he has blood in his stool for a month. Pt. Also thought to had seizure today  EXAM: CT ABDOMEN AND PELVIS WITHOUT CONTRAST  TECHNIQUE: Multidetector CT imaging of the abdomen and pelvis was performed following the standard protocol without IV contrast.  COMPARISON:  08/17/2013 and earlier studies  FINDINGS: Minimal dependent atelectasis in the visualized lung bases right greater than left. Transvenous pacing leads are partially seen.  Fatty liver. Unremarkable spleen. Mild bilateral adrenal hyperplasia. Stable 5 mm cystic lesion in the pancreatic body image 24/2. Stable 2.2 cm right parapelvic renal cyst. No hydronephrosis. Residual contrast material in the renal collecting systems and urinary bladder from previous CT contrast administration. Stable right bladder diverticulum.  Stomach, small bowel, and colon are nondilated. Normal appendix. Scattered descending and proximal sigmoid colon diverticula without adjacent inflammatory/edematous change. Moderate enlargement of the prostate. No free air. No ascites. Mild spondylitic changes in the lower lumbar spine. No adenopathy localized.  IMPRESSION: 1. Descending and sigmoid diverticulosis without CT evidence of diverticulitis. 2. No evidence of mass or adenopathy. 3. Fatty liver 4. Stable right renal cyst  and small cystic pancreatic body lesion.   Electronically Signed   By: Lucrezia Europe M.D.   On: 08/10/2014 17:42   Ct Head Wo Contrast  08/10/2014   CLINICAL DATA:  Collapse with loss of consciousness.  EXAM: CT HEAD WITHOUT CONTRAST  TECHNIQUE: Contiguous axial images were obtained from the base of the skull through the vertex without intravenous contrast.  COMPARISON:  None.   FINDINGS: Mild atrophy. Mild chronic microvascular ischemic change in the white matter.  Negative for acute infarct, hemorrhage, mass lesion.  Calvarium is intact.  Chronic right medial orbital fracture.  IMPRESSION: Atrophy and chronic microvascular ischemia.  No acute abnormality.   Electronically Signed   By: Franchot Gallo M.D.   On: 08/10/2014 14:18   Ct Angio Chest Pe W/cm &/or Wo Cm  08/10/2014   CLINICAL DATA:  Syncope  EXAM: CT ANGIOGRAPHY CHEST WITH CONTRAST  TECHNIQUE: Multidetector CT imaging of the chest was performed using the standard protocol during bolus administration of intravenous contrast. Multiplanar CT image reconstructions and MIPs were obtained to evaluate the vascular anatomy.  CONTRAST:  68mL OMNIPAQUE IOHEXOL 350 MG/ML SOLN  COMPARISON:  None.  FINDINGS: There are no filling defects in the pulmonary arterial tree to suggest acute pulmonary thromboembolism.  No abnormal mediastinal adenopathy. Dual lead pacemaker device is in place.  No pericardial effusion.  No pleural effusion.  No pneumothorax.  Dependent atelectasis in the lungs.  No mass or consolidation.  No vertebral compression deformity.  Bilateral gynecomastia.  Review of the MIP images confirms the above findings.  IMPRESSION: No evidence of acute pulmonary thromboembolism.   Electronically Signed   By: Marybelle Killings M.D.   On: 08/10/2014 14:25   Dg Chest Portable 1 View  08/10/2014   CLINICAL DATA:  Loss of consciousness.  EXAM: PORTABLE CHEST - 1 VIEW  COMPARISON:  09/23/2013  FINDINGS: Dual-chamber pacer leads from the left are in unremarkable position. There is no cardiomegaly. Stable mild aortic tortuosity. There is no edema, consolidation, effusion, or pneumothorax.  IMPRESSION: Stable exam.   No active disease.   Electronically Signed   By: Monte Fantasia M.D.   On: 08/10/2014 13:19    Microbiology: Recent Results (from the past 240 hour(s))  Urine culture     Status: None   Collection Time: 08/10/14  4:53 PM   Result Value Ref Range Status   Specimen Description URINE, CLEAN CATCH  Final   Special Requests NONE  Final   Colony Count NO GROWTH Performed at [facility]   Final   Culture NO GROWTH Performed at [facility]   Final   Report Status 08/11/2014 FINAL  Final  MRSA PCR Screening     Status: None   Collection Time: 08/10/14  6:15 PM  Result Value Ref Range Status   MRSA by PCR NEGATIVE NEGATIVE Final    Comment:        The GeneXpert MRSA Assay (FDA approved for NASAL specimens only), is one component of a comprehensive MRSA colonization surveillance program. It is not intended to diagnose MRSA infection nor to guide or monitor treatment for MRSA infections.   Blood culture (routine x 2)     Status: None   Collection Time: 08/10/14  7:40 PM  Result Value Ref Range Status   Specimen Description BLOOD RIGHT HAND  Final   Special Requests BOTTLES DRAWN AEROBIC ONLY 4CC  Final   Culture   Final    STAPHYLOCOCCUS SPECIES (COAGULASE NEGATIVE) Note: THE SIGNIFICANCE OF ISOLATING THIS ORGANISM FROM A SINGLE SET OF BLOOD CULTURES WHEN MULTIPLE SETS ARE DRAWN IS UNCERTAIN. PLEASE NOTIFY THE MICROBIOLOGY DEPARTMENT WITHIN ONE WEEK IF SPECIATION AND SENSITIVITIES ARE REQUIRED. Note: Gram Stain Report Called to,Read Back By and Verified With: HEATHER ON Shirley [id number] BY CASTC Performed at [facility]    Report Status 08/12/2014 FINAL  Final  Blood culture (routine x 2)     Status: None (Preliminary result)   Collection Time: 08/10/14  7:48 PM  Result Value Ref Range Status   Specimen Description BLOOD LEFT HAND  Final   Special Requests BOTTLES DRAWN AEROBIC AND ANAEROBIC 5CC  Final   Culture   Final           BLOOD CULTURE RECEIVED NO GROWTH TO DATE CULTURE WILL BE HELD FOR 5 DAYS BEFORE ISSUING A FINAL NEGATIVE REPORT Performed at [facility]    Report Status PENDING  Incomplete     Labs: Basic Metabolic Panel:  Recent Labs Lab  08/10/14 1227 08/10/14 1232 08/10/14 1432 08/10/14 1948 08/11/14 0305 08/12/14 0333 08/13/14 0540 08/14/14 1452  NA 140 139  --   --  141 136 133* 134*  K 5.3* 5.4* 4.7  --  4.9 3.3* 3.4* 4.0  CL 104 104  --   --  106 104 104 105  CO2 22  --   --   --  23 26 24 22   GLUCOSE 105* 107*  --   --  84 91 91 119*  BUN 12 16  --   --  13 11 8  <5*  CREATININE 1.22 1.40*  --  1.14 1.27 0.84 0.88 0.91  CALCIUM 8.7  --   --   --  8.2* 8.0* 8.0* 8.8   Liver Function Tests:  Recent Labs Lab 08/10/14 1227 08/11/14 0305 08/12/14 1030  AST 147* 94* 71*  ALT 72* 62* 46  ALKPHOS 103 90 77  BILITOT 1.5* 1.6* 1.0  PROT 6.7 6.1 5.9*  ALBUMIN 3.1* 2.8* 2.5*   No results for input(s): LIPASE, AMYLASE in the last 168 hours. No results for input(s): AMMONIA in the last 168 hours. CBC:  Recent Labs Lab 08/10/14 1227 08/10/14 1232 08/10/14 1948 08/11/14 0305 08/12/14 0333  WBC 4.4  --  5.0 4.2 2.8*  NEUTROABS 2.0  --   --   --   --   HGB 12.0* 13.6 11.4* 10.3* 9.6*  HCT 35.5* 40.0 33.2* 31.1* 28.6*  MCV 86.0  --  85.3 85.9 85.6  PLT 136*  --  117* 104* 97*   Cardiac Enzymes:  Recent Labs Lab 08/10/14 1227 08/11/14 0305  CKTOTAL  --  72  TROPONINI <0.03  --    BNP: BNP (last 3 results)  Recent Labs  08/10/14 1227  BNP 36.0    ProBNP (last 3 results) No results for input(s): PROBNP in the last 8760 hours.  CBG:  Recent Labs Lab 08/10/14 1256  GLUCAP 95       Signed:  Jennaya Pogue  [facility] 08/15/2014, 3:25 PM

## 2014-08-15 NOTE — Progress Notes (Signed)
Report given to [place], Nurse.

## 2014-08-15 NOTE — Progress Notes (Signed)
Patient Name: Jonathon [name]      SUBJECTIVE: feels better with less LH w standing  Past Medical History  Diagnosis Date  . BENIGN PROSTATIC HYPERTROPHY, HX OF 10/25/2008  . BRADYCARDIA 2005  . HYPERTENSION 11/21/2006  . 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 PACEMAKER, PERMANENT 2005    Gen change 2014 Medtronic Adaptic L dual-chamber pacemaker, serial [id number]   . TOBACCO ABUSE 10/25/2008    Quit 2012  . Iron deficiency anemia, unspecified 04/19/2013  . Asthma     Scheduled Meds:  Scheduled Meds: . diltiazem  240 mg Oral Daily  . enoxaparin (LOVENOX) injection  40 mg Subcutaneous Q24H  . flecainide  100 mg Oral BID  . hydrALAZINE  25 mg Oral 3 times per day  . pyridostigmine  60 mg Oral BID AC  . tamsulosin  0.4 mg Oral Daily   Continuous Infusions:   zolpidem    PHYSICAL EXAM Filed Vitals:   08/13/14 2107 08/14/14 0517 08/14/14 2107 08/15/14 0545  BP: 148/79 133/74 153/79 138/80  Pulse: 87 85 78   Temp: 98.5 F (36.9 C) 98.5 F (36.9 C) 98.9 F (37.2 C) 98.6 F (37 C)  TempSrc: Oral Oral Oral Oral  Resp: 19 20 16 19   Height:      Weight:      SpO2: 100% 98% 100% 98%    Well developed and nourished in no acute distress HENT normal Neck supple with JVP-flat Clear Regular rate and rhythm, no murmurs or gallops Abd-soft with active BS No Clubbing cyanosis edema Skin-warm and dry A & Oriented  Grossly normal sensory and motor function   TELEMETRY: Reviewed telemetry pt in nsr:    Intake/Output Summary (Last 24 hours) at 08/15/14 0625 Last data filed at 08/15/14 0553  Gross per 24 hour  Intake    480 ml  Output    400 ml  Net     80 ml    LABS: Basic Metabolic Panel:  Recent Labs Lab 08/10/14 1227 08/10/14 1232 08/10/14 1432 08/10/14 1948 08/11/14 0305 08/12/14 0333 08/13/14 0540 08/14/14 1452  NA 140 139  --   --  141 136 133* 134*  K 5.3* 5.4* 4.7  --  4.9 3.3* 3.4* 4.0  CL 104 104  --   --   106 104 104 105  CO2 22  --   --   --  23 26 24 22   GLUCOSE 105* 107*  --   --  84 91 91 119*  BUN 12 16  --   --  13 11 8  <5*  CREATININE 1.22 1.40*  --  1.14 1.27 0.84 0.88 0.91  CALCIUM 8.7  --   --   --  8.2* 8.0* 8.0* 8.8   Cardiac Enzymes: No results for input(s): CKTOTAL, CKMB, CKMBINDEX, TROPONINI in the last 72 hours. CBC:  Recent Labs Lab 08/10/14 1227 08/10/14 1232 08/10/14 1948 08/11/14 0305 08/12/14 0333  WBC 4.4  --  5.0 4.2 2.8*  NEUTROABS 2.0  --   --   --   --   HGB 12.0* 13.6 11.4* 10.3* 9.6*  HCT 35.5* 40.0 33.2* 31.1* 28.6*  MCV 86.0  --  85.3 85.9 85.6  PLT 136*  --  117* 104* 97*   PROTIME: No results for input(s): LABPROT, INR in the last 72 hours. Liver Function Tests:  Recent Labs  08/12/14 1030  AST 71*  ALT 46  ALKPHOS 77  BILITOT 1.0  PROT 5.9*  ALBUMIN 2.5*   No results for input(s): LIPASE, AMYLASE in the last 72 hours. BNP: BNP (last 3 results)  Recent Labs  08/10/14 1227  BNP 36.0         ASSESSMENT AND PLAN:  Active Problems: Orthostatic Hypotension Atrial Tachycardia Hypertension Pacemaker Anemia ?   He is much better with less orthostatic drop over  [place] 48 hrs Plan if for rehab  i would concur Signed, Virl Axe MD  08/15/2014

## 2014-08-15 NOTE — Clinical Social Work Note (Signed)
CSW unable to complete full assessment and placement note with patient. Patient's daughter and patient are requesting placement with [facility]. Patient has valid PASRR and [facility] is able to accept patient today. Patient's daughter states she will transport the patient to facility. CSW has left packet on chart and has asked that RN give packet to daughter once the patient is being discharged. CSW signing off at this time.    Liz Beach MSW, [place], [employer], [phone]

## 2014-08-16 ENCOUNTER — Encounter (HOSPITAL_COMMUNITY): Payer: Private Health Insurance - Indemnity

## 2014-08-16 ENCOUNTER — Telehealth: Payer: Self-pay | Admitting: Neurology

## 2014-08-16 DIAGNOSIS — E46 Unspecified protein-calorie malnutrition: Secondary | ICD-10-CM | POA: Diagnosis not present

## 2014-08-16 DIAGNOSIS — E291 Testicular hypofunction: Secondary | ICD-10-CM | POA: Diagnosis not present

## 2014-08-16 DIAGNOSIS — D649 Anemia, unspecified: Secondary | ICD-10-CM | POA: Diagnosis not present

## 2014-08-16 DIAGNOSIS — R55 Syncope and collapse: Secondary | ICD-10-CM | POA: Diagnosis not present

## 2014-08-16 DIAGNOSIS — D519 Vitamin B12 deficiency anemia, unspecified: Secondary | ICD-10-CM | POA: Diagnosis not present

## 2014-08-16 NOTE — Telephone Encounter (Signed)
He should still be able to be transported to his appointments.  Erica, please reschedule is steroid infusion (solumedrol 1g x 1 dose every month).   I will ask Caryl Pina to see about IVIG.  Asiel Chrostowski K. Posey Pronto, DO

## 2014-08-16 NOTE — Telephone Encounter (Signed)
[name], pt's daughter called wanting to speak to Dr. Posey Pronto in reference to his IV treatment that was canceled for today since he was in the hospital. C/b [phone]

## 2014-08-16 NOTE — Telephone Encounter (Signed)
Spoke with patients daughter he will be at [facility] rehab for a while what should patient do about iv treatments

## 2014-08-16 NOTE — Telephone Encounter (Signed)
Spoke with Joelene Millin nurse at [facility] she asked if i could call back tomorrow to coordinate this appointment.

## 2014-08-17 ENCOUNTER — Other Ambulatory Visit: Payer: Self-pay | Admitting: *Deleted

## 2014-08-17 DIAGNOSIS — R945 Abnormal results of liver function studies: Secondary | ICD-10-CM | POA: Diagnosis not present

## 2014-08-17 DIAGNOSIS — G7000 Myasthenia gravis without (acute) exacerbation: Secondary | ICD-10-CM | POA: Diagnosis not present

## 2014-08-17 DIAGNOSIS — D72819 Decreased white blood cell count, unspecified: Secondary | ICD-10-CM | POA: Diagnosis not present

## 2014-08-17 DIAGNOSIS — D696 Thrombocytopenia, unspecified: Secondary | ICD-10-CM | POA: Diagnosis not present

## 2014-08-17 DIAGNOSIS — G6181 Chronic inflammatory demyelinating polyneuritis: Secondary | ICD-10-CM

## 2014-08-17 LAB — CULTURE, BLOOD (ROUTINE X 2): CULTURE: NO GROWTH

## 2014-08-17 MED ORDER — METHYLPREDNISOLONE SODIUM SUCC 1000 MG IJ SOLR: 1000.0000 mg | Freq: Once | INTRAMUSCULAR | Status: DC

## 2014-08-17 NOTE — Telephone Encounter (Signed)
Forwarded to [employer]

## 2014-08-17 NOTE — Telephone Encounter (Signed)
Called [name] and let them know that I have scheduled the infusion appointment for April 12 at 11:00.  They said that he has a doctor appointment with Dr. Posey Pronto on that day also at 9:15.  They will transport him to both appointments.  Daughter also informed.

## 2014-08-18 DIAGNOSIS — D72819 Decreased white blood cell count, unspecified: Secondary | ICD-10-CM | POA: Diagnosis not present

## 2014-08-18 DIAGNOSIS — R945 Abnormal results of liver function studies: Secondary | ICD-10-CM | POA: Diagnosis not present

## 2014-08-18 DIAGNOSIS — G7000 Myasthenia gravis without (acute) exacerbation: Secondary | ICD-10-CM | POA: Diagnosis not present

## 2014-08-18 DIAGNOSIS — D696 Thrombocytopenia, unspecified: Secondary | ICD-10-CM | POA: Diagnosis not present

## 2014-08-19 ENCOUNTER — Ambulatory Visit (HOSPITAL_COMMUNITY)
Admission: RE | Admit: 2014-08-19 | Payer: Private Health Insurance - Indemnity | Source: Ambulatory Visit | Admitting: Surgery

## 2014-08-19 ENCOUNTER — Encounter (HOSPITAL_COMMUNITY): Admission: RE | Payer: Self-pay | Source: Ambulatory Visit

## 2014-08-19 SURGERY — TRANSANAL HEMORRHOIDAL DEARTERIALIZATION
Anesthesia: General

## 2014-08-22 ENCOUNTER — Other Ambulatory Visit: Payer: Medicare Other

## 2014-08-22 ENCOUNTER — Ambulatory Visit: Payer: Medicare Other | Admitting: Hematology & Oncology

## 2014-08-22 ENCOUNTER — Telehealth: Payer: Self-pay | Admitting: Hematology & Oncology

## 2014-08-22 NOTE — Telephone Encounter (Signed)
Called patient's home and cell number to resch lab/md appt due to MD being out.  I did Not get an answer so i left messages on both phones

## 2014-08-23 ENCOUNTER — Ambulatory Visit (INDEPENDENT_AMBULATORY_CARE_PROVIDER_SITE_OTHER): Payer: Medicare Other | Admitting: Neurology

## 2014-08-23 ENCOUNTER — Encounter: Payer: Self-pay | Admitting: Neurology

## 2014-08-23 ENCOUNTER — Encounter (HOSPITAL_COMMUNITY)
Admission: RE | Admit: 2014-08-23 | Discharge: 2014-08-23 | Disposition: A | Discharge status: Home / Self Care | Payer: Medicare Other | Source: Ambulatory Visit | Attending: Neurology | Admitting: Neurology

## 2014-08-23 VITALS — BP 150/80 | HR 87 | Ht 70.0 in | Wt 196.3 lb

## 2014-08-23 DIAGNOSIS — R29898 Other symptoms and signs involving the musculoskeletal system: Secondary | ICD-10-CM

## 2014-08-23 DIAGNOSIS — D472 Monoclonal gammopathy: Secondary | ICD-10-CM | POA: Diagnosis not present

## 2014-08-23 DIAGNOSIS — G6181 Chronic inflammatory demyelinating polyneuritis: Secondary | ICD-10-CM | POA: Diagnosis not present

## 2014-08-23 DIAGNOSIS — G609 Hereditary and idiopathic neuropathy, unspecified: Secondary | ICD-10-CM | POA: Insufficient documentation

## 2014-08-23 DIAGNOSIS — G610 Guillain-Barre syndrome: Secondary | ICD-10-CM

## 2014-08-23 DIAGNOSIS — M6281 Muscle weakness (generalized): Secondary | ICD-10-CM | POA: Insufficient documentation

## 2014-08-23 DIAGNOSIS — M7989 Other specified soft tissue disorders: Secondary | ICD-10-CM

## 2014-08-23 MED ORDER — SODIUM CHLORIDE 0.9 % IV SOLN
1000.0000 mg | INTRAVENOUS | Status: DC
  Administered 2014-08-23: 1000 mg via INTRAVENOUS
  Filled 2014-08-23: qty 8

## 2014-08-23 NOTE — Progress Notes (Signed)
[facility]  Follow-up Visit   Date: 08/23/2014   Jonathon [name] MRN: [id number] DOB: 03-29-1936   Interim History: Jonathon [name] is a 79 y.o. 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  He is here with his daughter, [name] today.    History of present illness: Patient was in his usual state of health until 2013. He has always been physical active and would go to the gym daily (walking up 10 miles/day). During the spring of 2013, he developed relatively subacute onset of left > right hamstring soreness without an inciting event. Pain initially started in the butt area and radiated down. There is associated proximal leg weakness and loss of muscle mass. Functionally, he is still able to complete all his usual activities, but has became much more cautious when climbing stairs.   - Follow-up 02/01/2013:  He continues to have weakness of his proximal legs, worse on the left. EMG did not show any myopathy or radiculopathy of the left lower extremity.  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  CK, aldolase, B12 was within normal limits. SPEP/UPEP shows monoclonal IgA kappa gammopathy and he underwent extensive hematologic testing, including bone marrow biopsy by Dr. Marin Olp which has been nondiagnostic.    - Follow-up 08/10/2013: Progressive loss of muscle bulk over the upper arms and proximal legs. He has difficulty with climbing stairs and opening jars, and dyspnea on exertion. He feels unsteady at times, denies any falls. 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   He reports previously drinking 4 beers/day and on the weekends up to 6 beers/day.   He also reports having 15lb unintentional weight loss and has been eating little.    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  CK is normal, aldolase elevated, and GM1 antibody is negative. There is evidence of severe multilevel spinal stenosis of the cervical spine, and milder findings in the lumbar region.  Clinically, his remains stable without any significant worsening.    - Follow-up 10/12/2013:  CSF studies performed shows increase oligoclonal bands with normal cell count, IgG index, and myelin basic proteins.   - Follow-up 11/09/2013:  He completed 3-days of solumedrol (6/8/-6/10) and since starting steroids, his muscle soreness has significantly improved.   His appetite is better and he gained 5lb over the past month.  Mood remains good.  He is still drinking 2 beers/nightly and 6 beers/d on the weekends.  Anti-MAG returned negative.  - Follow-up 02/09/2014:  He continues to get monthly steroid infusions and has been tolerating it well. There has been no worsening or notable change in muscle strength that he reports, although he has been going to the gym and pushing 85 pounds with leg exercises. He has no interval falls.      - Follow-up 05/16/2014:  He completed 85-month of monthly IVMP and reports little benefit over the past few months, but no worsening.  He continues to have leg weakness and early fatigue.  He does not feel that his hands or legs are weaker and has been able to increase the weight on his biceps curls [facility]  He also quit drinking his 6-pack of beer last week.  He endorses jittery feeling at times  and imbalance.  He continues to gain weight and is now back to his baseline weight (198lb).  UPDATE 07/22/2014:  Over the past month, he noticed increased weakness of his legs and has lost 11lb.  Appetite remains poor.  Last week at work, he work broke out in a sweat, became light headed, and when he stepped off the machine, he fell.  There was no loss of consciousness and he was  able to get up by himself, but since then he has decided to stop working now.  He is no longer weight lifting at the gym, but does enjoy using the sauna at the [facility].  He continues to have problems with endurance and easily fatigue with walking.  UPDATE 08/23/2014:  Patient was hospitalized at [facility] from 3/30-08/15/2014 for syncopal spell secondary to orthostatic hypotension which occurred when he was getting ready to leave his cardiologist's office. He has since been started on mestinon and is using compression stockings. He was discharged to rehab facility where he is doing well with hopes of discharge home later this week. He has noticed new bilateral ankles swelling over the past few days, no associated pain or shortness of breath.   Recent lab testing continues to show mild transaminitis.   No further lightheaded spells.  He has gained 9lb!  No new complaints.   Medications:  Current Outpatient Prescriptions on File Prior to Visit  Medication Sig Dispense Refill  . Cyanocobalamin (VITAMIN B 12 PO) Take 1,000 mg by mouth daily.    Marland Kitchen diltiazem (CARDIZEM LA) 240 MG 24 hr tablet Take 1 tablet (240 mg total) by mouth daily. 90 tablet 3  . flecainide (TAMBOCOR) 100 MG tablet Take 1 tablet (100 mg total) by mouth 2 (two) times daily.    . Garlic 2585 MG CAPS Take 2,000 mg by mouth daily at 12 noon.    . hydrALAZINE (APRESOLINE) 25 MG tablet Take 1 tablet (25 mg total) by mouth every 8 (eight) hours.    . Multiple Vitamins-Minerals (CENTRUM ADULTS PO) Take by mouth.    . Omega-3 Fatty Acids (FISH OIL) 1000 MG CAPS Take by mouth every morning.    . pyridostigmine (MESTINON) 60 MG tablet Take 1 tablet (60 mg total) by mouth 2 (two) times daily before a meal.    . tamsulosin (FLOMAX) 0.4 MG CAPS capsule TAKE 1 CAPSULE BY MOUTH DAILY 90 capsule 1  . testosterone (ANDROGEL) 50 MG/5GM (1%) GEL APPLY 1 PACKET ONTO THE SKIN ONCE DAILY AS DIRECTED 150 g 0   Current Facility-Administered Medications on File  Prior to Visit  Medication Dose Route Frequency Provider Last Rate Last Dose  . methylPREDNISolone sodium succinate (SOLU-MEDROL) injection 1,000 mg  1,000 mg Intravenous Once Donika K Patel, DO      . methylPREDNISolone sodium succinate (SOLU-MEDROL) injection 1,000 mg  1,000 mg Intravenous Once Donika K Patel, DO      . methylPREDNISolone sodium succinate (SOLU-MEDROL) injection 1,000 mg  1,000 mg Intravenous Once Donika K Patel, DO      . methylPREDNISolone sodium succinate (SOLU-MEDROL) injection 1,000 mg  1,000 mg Intravenous Once Donika K Patel, DO        Allergies: No Known Allergies   Review of Systems:  CONSTITUTIONAL: No fevers, chills, night sweats EYES: No visual changes or eye pain ENT: No hearing changes.  No history of nose bleeds.   RESPIRATORY: No cough, wheezing and shortness of breath.   CARDIOVASCULAR: Negative for chest pain, and palpitations.   GI:  Negative for abdominal discomfort, blood in stools or black stools.  No recent change in bowel habits.   GU:  No history of incontinence.   MUSCLOSKELETAL: No history of joint pain or swelling.  No myalgias.   SKIN: Negative for lesions, rash, and itching.   HEMATOLOGY/ONCOLOGY: Negative for prolonged bleeding, bruising easily, and swollen nodes.  ENDOCRINE: Negative for cold or heat intolerance, polydipsia or goiter.   PSYCH:  +depression or anxiety symptoms.   NEURO: As Above.   Vital Signs:  BP 150/80 mmHg  Pulse 87  Ht '5\' 10"'  (1.778 m)  Wt 196 lb 5 oz (89.047 kg)  BMI 28.17 kg/m2  SpO2 97%  General:  Well appearing, no acute distress Ext:  Bilateral lower leg edema (1+ pitting edema)  Neurological Exam: MENTAL STATUS including orientation to time, place, person, recent and remote memory, attention span and concentration, language, and fund of knowledge is normal.  Speech is not dysarthric.  CRANIAL NERVES:  Pupils are round and reactive to light.  Normal conjugate, extra-ocular eye movements in all  directions of gaze. No ptosis.  Face is symmetric.  Palate elevates symmetrically.  Normal shoulder shrug and head rotation.  Normal tongue strength and range of motion, no deviation or fasciculation.  MOTOR:  Bilateral moderate ADM, FDI, ABP and moderate bilateral quadriceps atrophy.  No fasciculations.  No tremor or pronator drift.  Neck flexion and extension is 5/5  Right Upper Extremity:    Left Upper Extremity:    Deltoid  5/5   Deltoid  5/5   Biceps  5/5   Biceps  5/5   Triceps  5/5   Triceps  5/5   Wrist extensors  5/5   Wrist extensors  5/5   Wrist flexors  5/5   Wrist flexors  5/5   Finger extensors  5/5   Finger extensors  5/5   Finger flexors  5/5   Finger flexors  5/5   Dorsal interossei  4/5   Dorsal interossei  4/5   Abductor pollicis  4+/5   Abductor pollicis * 5-/5   Tone (Ashworth scale)  0  Tone (Ashworth scale)  0   Right Lower Extremity:    Left Lower Extremity:    Hip flexors  4/5   Hip flexors  4/5   Hip extensors  5/5   Hip extensors  5/5   Knee flexors  5/5   Knee flexors  5/5   Knee extensors  5/5   Knee extensors  5/5   Dorsiflexors  5/5   Dorsiflexors  5/5   Plantarflexors  5/5   Plantarflexors  5/5   Toe extensors  5/5   Toe extensors  5/5   Toe flexors  5/5   Toe flexors  5/5   Tone (Ashworth scale)  0  Tone (Ashworth scale)  0   MSRs:  Reflexes are 2+/4 in upper extremities, 2+ patella, and absent at Achilles.  Down-going plantar responses.  SENSORY: Vibration and temperature reduced distal to left ankle.  Pin prick intact throughout.    COORDINATION/GAIT:  Intact rapid alternating movements bilaterally.  Able to rise from a chair without using arms.  Gait narrow based and stable. Tandem and stressed gait intact.  He is able to perform squat and get up without difficulty  Data: Labs 01/13/2013:  CK 55, aldolase 7.9, TSH 0.39, copper 97, ceruloplasmin 30,  Labs 07/2013:  vitamin  B12 519, ANA neg, RF neg, ESR 5 Labs 08/10/2013:  CK 91, aldolase  14.9*, GM1  antibody - neg Labs 11/09/2013:  VEGF 118* (normal 31-86) Labs 06/02/2014:  VEGF <31 Labs 08/23/2014:  Vitamin B12 643, folate 18.6, CK 72  CSF 09/24/2013:  R1 W0 G59 Protein 42, IgG index 4.4, MBP < 2.0, WNV - neg, cytology - neg, OCB - 3 well defined bands present in CSF and serum, more prominent in CSF  EMG 01/25/2013:   1. 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  2. Left ulnar neuropathy at the elbow, moderately severe in degree electrically. A superimposed C8 motor radiculopathy cannot be excluded. 3. No evidence of diffuse motor axon loss or a generalized myopathy.  EMG 09/16/2013:  There is electrophysiological evidence of a chronic generalized sensorimotor polyradiculoneuropathy, axonal loss and demyelinating in type, affecting the left side. When compared to his previous EMG dated 01/25/2013, there is an interval worsening of findings.  CT cervical spine and lumbar spine 08/17/2013: 1. Advanced degenerative changes in the cervical spine, including multilevel bulky anterior endplate osteophytes.  2. Suspect multifactorial moderate to severe cervical spinal stenosis at the C5-C6 and C6-C7 levels. As the patient is not a  candidate for MRI, cervical spine CT myelogram would confirm.  3. Associated severe multifactorial neural foraminal stenosis at the right C4, right C5, right C6, bilateral C7, and bilateral C8 nerve levels.  4. Mild for age lumbar spine degenerative changes, chiefly facet arthropathy. Up to mild multifactorial spinal stenosis at L2-L3 and L3-L4.    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  His electrodiagnostic testing last performed in  May 2015 showed progression of axon loss and demyelinating features affecting the left side, most suggestive of an acquired polyradiculoneuropathy.  He underwent CSF testing which showed normal cell count and protein. 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     Labs showed monoclonal IgA kappa gammopathy on serum for which he underwent extensive evaluation by Dr. Marin Olp, including bone marrow biopsy which was negative.  He also has transaminatis likely due to fatty liver.  The possibility of POEMS was raised given elevated VEGF, but repeat VEGF from 05/2014 returned normal.  Patient was started on solumedrol in June 2015 and has been getting monthly IVMP since then, but reports new worsening of leg weakness since the start of 2016.  His exam discloses proximal leg (worse) and hand weakness (stable).  His persistent hand weakness persists and may also be due to superimposed cervical foraminal stenosis at this level. We are still awaiting prior aurthorization for his IVIG and once this is approved, will transition him to this.   PLAN: Continue solumedrol 1g monthly for now, until we get a better idea of what IVIG would cost patient Awaiting prior authorization for IVIG Encouraged him to use a 4-wheeled rollator as needed for long distances Great to see he has gained 9lb in the past month F/U with PCP regarding leg swelling, in the meantime, continue compression stockings.  ?medication effect  Return to clinic in 14-month.    The duration of this appointment visit was 30 minutes of face-to-face time with the patient.  Greater than 50% of this time was spent in  counseling, explanation of diagnosis, planning of further management, and coordination of care.   Thank you for allowing me to participate in patient's care.  If I  can answer any additional questions, I would be pleased to do so.    Sincerely,    Donika K. Posey Pronto, DO

## 2014-08-23 NOTE — Patient Instructions (Addendum)
Consider using a 4-wheeled rollator Continue with monthly steroid infusion and I will inform you when I get more information about the IVIG Keep up the work with your weight gain! Continue your home exercises Please discuss your leg swelling with your primary care providers

## 2014-09-01 ENCOUNTER — Other Ambulatory Visit: Payer: Self-pay | Admitting: Internal Medicine

## 2014-09-09 ENCOUNTER — Encounter: Payer: Self-pay | Admitting: Internal Medicine

## 2014-09-09 ENCOUNTER — Ambulatory Visit (INDEPENDENT_AMBULATORY_CARE_PROVIDER_SITE_OTHER): Payer: Medicare Other | Admitting: Internal Medicine

## 2014-09-09 VITALS — BP 152/72 | HR 100 | Ht 70.0 in | Wt 193.8 lb

## 2014-09-09 DIAGNOSIS — R0602 Shortness of breath: Secondary | ICD-10-CM | POA: Diagnosis not present

## 2014-09-09 DIAGNOSIS — Z950 Presence of cardiac pacemaker: Secondary | ICD-10-CM | POA: Diagnosis not present

## 2014-09-09 DIAGNOSIS — I471 Supraventricular tachycardia: Secondary | ICD-10-CM | POA: Diagnosis not present

## 2014-09-09 DIAGNOSIS — I10 Essential (primary) hypertension: Secondary | ICD-10-CM

## 2014-09-09 DIAGNOSIS — R001 Bradycardia, unspecified: Secondary | ICD-10-CM

## 2014-09-09 DIAGNOSIS — R55 Syncope and collapse: Secondary | ICD-10-CM

## 2014-09-09 LAB — MDC_IDC_ENUM_SESS_TYPE_INCLINIC
Battery Impedance: 159 Ohm
Battery Remaining Longevity: 125 mo
Battery Voltage: 2.78 V
Brady Statistic AP VP Percent: 0 %
Brady Statistic AS VP Percent: 69 %
Lead Channel Impedance Value: 474 Ohm
Lead Channel Pacing Threshold Amplitude: 1.25 V
Lead Channel Pacing Threshold Pulse Width: 0.4 ms
Lead Channel Pacing Threshold Pulse Width: 0.4 ms
Lead Channel Sensing Intrinsic Amplitude: 4 mV
Lead Channel Setting Pacing Amplitude: 2.5 V
Lead Channel Setting Pacing Pulse Width: 0.4 ms
Lead Channel Setting Sensing Sensitivity: 2 mV
MDC IDC MSMT LEADCHNL RV IMPEDANCE VALUE: 496 Ohm
MDC IDC MSMT LEADCHNL RV PACING THRESHOLD AMPLITUDE: 0.75 V
MDC IDC MSMT LEADCHNL RV SENSING INTR AMPL: 4 mV
MDC IDC SESS DTM: 20160429111016
MDC IDC SET LEADCHNL RA PACING AMPLITUDE: 2.25 V
MDC IDC STAT BRADY AP VS PERCENT: 0 %
MDC IDC STAT BRADY AS VS PERCENT: 30 %

## 2014-09-09 MED ORDER — POTASSIUM CHLORIDE ER 10 MEQ PO TBCR: 10.0000 meq | EXTENDED_RELEASE_TABLET | Freq: Every day | ORAL | Status: DC

## 2014-09-09 MED ORDER — FUROSEMIDE 20 MG PO TABS: 20.0000 mg | ORAL_TABLET | Freq: Every day | ORAL | Status: DC

## 2014-09-09 NOTE — Patient Instructions (Addendum)
Medication Instructions:  Your physician has recommended you make the following change in your medication:  1) START Lasix 20 mg by mouth daily - you may stop taking when swelling is gone 2) START Potassium 10 mEq daily with Lasix - you may stop taking when swelling is gone  [employer] Your physician recommends that you return for lab work in: Wed. 5/4 in our office 8 am. ([place] [facility])   Testing/Procedures: NONE  Follow-Up: Your physician recommends that you schedule a follow-up appointment in: 3 months with Dr. Lovena Le.   Any Other Special Instructions Will Be Listed Below (If Applicable).

## 2014-09-09 NOTE — Assessment & Plan Note (Signed)
His shortness of breath has improved with restoration of normal rhythm. He does have some peripheral edema, and we will start him on Lasix.

## 2014-09-09 NOTE — Progress Notes (Signed)
HPI [name] returns today for followup. He is a pleasant 79 yo man with a h/o symptomatic bradycardia, s/p PPM. He has had problems with weakness, muscle loss and fatigue and has been diagnosed with a chronic demyelinating disorder. He also notes his palpitations have improved since starting flecainide. Overall he feels better although he has had some chronic peripheral edema which is new. He denies dietary indiscretion with sodium.  No Known Allergies   Current Outpatient Prescriptions  Medication Sig Dispense Refill  . Cyanocobalamin (VITAMIN B 12 PO) Take 1,000 mg by mouth daily.    Marland Kitchen diltiazem (CARDIZEM LA) 240 MG 24 hr tablet Take 1 tablet (240 mg total) by mouth daily. 90 tablet 3  . flecainide (TAMBOCOR) 100 MG tablet Take 1 tablet (100 mg total) by mouth 2 (two) times daily.    . Garlic 2202 MG CAPS Take 2,000 mg by mouth daily at 12 noon.    . hydrALAZINE (APRESOLINE) 25 MG tablet Take 1 tablet (25 mg total) by mouth every 8 (eight) hours.    . Multiple Vitamins-Minerals (CENTRUM ADULTS PO) Take 1 capsule by mouth daily.     . Omega-3 Fatty Acids (FISH OIL) 1000 MG CAPS Take 1 capsule by mouth every morning.     . pyridostigmine (MESTINON) 60 MG tablet Take 1 tablet (60 mg total) by mouth 2 (two) times daily before a meal.    . tamsulosin (FLOMAX) 0.4 MG CAPS capsule TAKE ONE CAPSULE BY MOUTH EVERY DAY 90 capsule 1  . testosterone (ANDROGEL) 50 MG/5GM (1%) GEL APPLY 1 PACKET ONTO THE SKIN ONCE DAILY AS DIRECTED 150 g 0  . furosemide (LASIX) 20 MG tablet Take 1 tablet (20 mg total) by mouth daily. May stop once swelling is gone 90 tablet 3  . potassium chloride (K-DUR) 10 MEQ tablet Take 1 tablet (10 mEq total) by mouth daily. May stop once swelling is gone 90 tablet 1   No current facility-administered medications for this visit.   Facility-Administered Medications Ordered in Other Visits  Medication Dose Route Frequency Provider Last Rate Last Dose  . methylPREDNISolone sodium  succinate (SOLU-MEDROL) injection 1,000 mg  1,000 mg Intravenous Once Donika K Patel, DO      . methylPREDNISolone sodium succinate (SOLU-MEDROL) injection 1,000 mg  1,000 mg Intravenous Once Donika K Patel, DO      . methylPREDNISolone sodium succinate (SOLU-MEDROL) injection 1,000 mg  1,000 mg Intravenous Once Donika K Patel, DO      . methylPREDNISolone sodium succinate (SOLU-MEDROL) injection 1,000 mg  1,000 mg Intravenous Once [name], DO         Past Medical History  Diagnosis Date  . BENIGN PROSTATIC HYPERTROPHY, HX OF 10/25/2008  . BRADYCARDIA 2005  . HYPERTENSION 11/21/2006  . 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 PACEMAKER, PERMANENT 2005    Gen change 2014 Medtronic Adaptic L dual-chamber pacemaker, serial [id number]   . TOBACCO ABUSE 10/25/2008    Quit 2012  . Iron deficiency anemia, unspecified 04/19/2013  . Asthma     ROS:   All systems reviewed and negative except as noted in the HPI.   Past Surgical History  Procedure Laterality Date  . Pacemaker insertion  2005  . Colonoscopy  5427,0623  . Skin graft Right 1962    wrist  . Permanent pacemaker generator change N/A 06/19/2012    Procedure: PERMANENT PACEMAKER GENERATOR CHANGE;  Surgeon: [name], MD; Medtronic Adaptic L dual-chamber pacemaker, serial #[id number]  Family History  Problem Relation Age of Onset  . Breast cancer Sister     Living, 55  . Diabetes type II Brother     Living, 26  . Breast cancer Sister     Living, 5  . Heart attack Father     Died, 27  . Kidney disease Father   . Parkinson's disease Mother     Died, 75  . Diabetes Daughter     Living, 67  . Diabetes Son     Living, 9  . Colon cancer Neg Hx   . Rectal cancer Neg Hx   . Stomach cancer Neg Hx   . Ovarian cancer Daughter      History   Social History  . Marital Status: Divorced    Spouse Name: N/A  . Number of Children: 4  . Years of Education: N/A   Occupational  History  . ENVIROMENTAL SERVICE    Social History Main Topics  . Smoking status: Former Smoker -- 0.25 packs/day for 46 years    Types: Cigarettes    Start date: 03/16/1965    Quit date: 06/15/2010  . Smokeless tobacco: Never Used     Comment: quit 3 years ago  . Alcohol Use: 0.0 oz/week    0 Standard drinks or equivalent per week     Comment: 4 beers per day  . Drug Use: No  . Sexual Activity: Not on file   Other Topics Concern  . 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     BP 152/72 mmHg  Pulse 100  Ht 5\' 10"  (1.778 m)  Wt 193 lb 12.8 oz (87.907 kg)  BMI 27.81 kg/m2  Physical Exam:  anxious appearing 79 yo man, NAD HEENT: Unremarkable Neck:  8 cm JVD, no thyromegally Back:  No CVA tenderness Lungs:  scattered rales, well healed PPM incision, some increased work of breathing HEART:  Regular tachy rhythm, no murmurs, no rubs, no clicks, precordium is shaking Abd:  soft, positive bowel sounds, no organomegally, no rebound, no guarding Ext:  2 plus pulses, 2+ peripheral edema, no cyanosis, no clubbing Skin:  No rashes no nodules Neuro:  CN II through XII intact, motor grossly intact  DEVICE  Normal device function.  See PaceArt for details.   Assess/Plan:

## 2014-09-09 NOTE — Assessment & Plan Note (Signed)
His Medtronic dual-chamber pacemaker is working normally. We'll plan to recheck in several months.

## 2014-09-09 NOTE — Assessment & Plan Note (Signed)
His SVT is improved. He will continue flecainide.

## 2014-09-09 NOTE — Assessment & Plan Note (Signed)
His blood pressure is slightly elevated today. Hopefully this will improve with low-dose diuretic therapy and sodium reduction.

## 2014-09-14 ENCOUNTER — Other Ambulatory Visit (INDEPENDENT_AMBULATORY_CARE_PROVIDER_SITE_OTHER): Payer: Medicare Other | Admitting: *Deleted

## 2014-09-14 DIAGNOSIS — R55 Syncope and collapse: Secondary | ICD-10-CM

## 2014-09-14 DIAGNOSIS — I498 Other specified cardiac arrhythmias: Secondary | ICD-10-CM | POA: Diagnosis not present

## 2014-09-14 DIAGNOSIS — I471 Supraventricular tachycardia: Secondary | ICD-10-CM | POA: Diagnosis not present

## 2014-09-15 ENCOUNTER — Encounter: Payer: Self-pay | Admitting: Hematology & Oncology

## 2014-09-15 ENCOUNTER — Ambulatory Visit (HOSPITAL_BASED_OUTPATIENT_CLINIC_OR_DEPARTMENT_OTHER): Payer: Medicare Other

## 2014-09-15 ENCOUNTER — Ambulatory Visit (HOSPITAL_BASED_OUTPATIENT_CLINIC_OR_DEPARTMENT_OTHER): Payer: Medicare Other | Admitting: Hematology & Oncology

## 2014-09-15 VITALS — BP 149/77 | HR 107 | Temp 98.6°F | Resp 18 | Ht 70.0 in | Wt 190.0 lb

## 2014-09-15 DIAGNOSIS — R7989 Other specified abnormal findings of blood chemistry: Secondary | ICD-10-CM | POA: Diagnosis not present

## 2014-09-15 DIAGNOSIS — G610 Guillain-Barre syndrome: Secondary | ICD-10-CM

## 2014-09-15 DIAGNOSIS — G6181 Chronic inflammatory demyelinating polyneuritis: Secondary | ICD-10-CM

## 2014-09-15 DIAGNOSIS — D509 Iron deficiency anemia, unspecified: Secondary | ICD-10-CM

## 2014-09-15 DIAGNOSIS — E291 Testicular hypofunction: Secondary | ICD-10-CM | POA: Diagnosis not present

## 2014-09-15 LAB — IRON AND TIBC CHCC
%SAT: 5 % — AB (ref 20–55)
Iron: 17 ug/dL — ABNORMAL LOW (ref 42–163)
TIBC: 366 ug/dL (ref 202–409)
UIBC: 349 ug/dL (ref 117–376)

## 2014-09-15 LAB — CBC WITH DIFFERENTIAL (CANCER CENTER ONLY)
BASO#: 0.1 10*3/uL (ref 0.0–0.2)
BASO%: 1.4 % (ref 0.0–2.0)
EOS%: 5.5 % (ref 0.0–7.0)
Eosinophils Absolute: 0.2 10*3/uL (ref 0.0–0.5)
HCT: 28.8 % — ABNORMAL LOW (ref 38.7–49.9)
HEMOGLOBIN: 9.7 g/dL — AB (ref 13.0–17.1)
LYMPH#: 1.1 10*3/uL (ref 0.9–3.3)
LYMPH%: 32 % (ref 14.0–48.0)
MCH: 27 pg — AB (ref 28.0–33.4)
MCHC: 33.7 g/dL (ref 32.0–35.9)
MCV: 80 fL — ABNORMAL LOW (ref 82–98)
MONO#: 0.6 10*3/uL (ref 0.1–0.9)
MONO%: 17.3 % — ABNORMAL HIGH (ref 0.0–13.0)
NEUT#: 1.5 10*3/uL (ref 1.5–6.5)
NEUT%: 43.8 % (ref 40.0–80.0)
Platelets: 179 10*3/uL (ref 145–400)
RBC: 3.59 10*6/uL — AB (ref 4.20–5.70)
RDW: 14.1 % (ref 11.1–15.7)
WBC: 3.5 10*3/uL — ABNORMAL LOW (ref 4.0–10.0)

## 2014-09-15 LAB — COMPREHENSIVE METABOLIC PANEL
ALBUMIN: 3.4 g/dL — AB (ref 3.5–5.2)
ALT: 29 U/L (ref 0–53)
AST: 59 U/L — ABNORMAL HIGH (ref 0–37)
Alkaline Phosphatase: 90 U/L (ref 39–117)
BUN: 10 mg/dL (ref 6–23)
CALCIUM: 8.2 mg/dL — AB (ref 8.4–10.5)
CHLORIDE: 103 meq/L (ref 96–112)
CO2: 24 meq/L (ref 19–32)
CREATININE: 0.92 mg/dL (ref 0.50–1.35)
Glucose, Bld: 86 mg/dL (ref 70–99)
Potassium: 4.1 mEq/L (ref 3.5–5.3)
Sodium: 140 mEq/L (ref 135–145)
Total Bilirubin: 0.6 mg/dL (ref 0.2–1.2)
Total Protein: 7 g/dL (ref 6.0–8.3)

## 2014-09-15 LAB — FERRITIN CHCC: Ferritin: 58 ng/mL (ref 22–316)

## 2014-09-15 MED ORDER — SODIUM CHLORIDE 0.9 % IV SOLN
Freq: Once | INTRAVENOUS | Status: AC
  Administered 2014-09-15: 09:00:00 via INTRAVENOUS

## 2014-09-15 MED ORDER — SODIUM CHLORIDE 0.9 % IV SOLN
510.0000 mg | Freq: Once | INTRAVENOUS | Status: AC
  Administered 2014-09-15: 510 mg via INTRAVENOUS
  Filled 2014-09-15: qty 17

## 2014-09-15 NOTE — Patient Instructions (Signed)

## 2014-09-15 NOTE — Progress Notes (Signed)
Hematology and Oncology Follow Up Visit  Jonathon [name] [id number] 01/16/1936 79 y.o. 09/15/2014   Principle Diagnosis:  Iron deficiency anemia Hypotestosteronemia CIDP Hepatic steatosis  Current Therapy:    IV iron as indicated  Testosterone gel supplementation  Patient start IVIG     Interim History:  Jonathon [name] is back for followup. He was recently hospitalized. He really had a syncopal episode. I'm unsure exactly what the source of this was. He wears an abdominal binder. I suspect he may have had some kind of autonomic hypotension.  His neurologist is going to start him on IVIG now. Apparently the polyneuropathy seems to be worsening.  He's had no issues with pain. He is not working. He has some chronic weakness in his legs.  He has had some bright blood per rectum. He has terrible hemorrhoids. He really needs to have the hemorrhoids fixed.   He still does have some alcohol use. He drinks a couple beers a day.  He's had no fever. He's had no rashes. His had no change in bowel or bladder habits.  We last saw him back in March, there is no detectable monoclonal spike. His IgA level back in January was 743 mg/dL.  Medications:  Current outpatient prescriptions:  .  Cyanocobalamin (VITAMIN B 12 PO), Take 1,000 mg by mouth daily., Disp: , Rfl:  .  diltiazem (CARDIZEM LA) 240 MG 24 hr tablet, Take 1 tablet (240 mg total) by mouth daily., Disp: 90 tablet, Rfl: 3 .  flecainide (TAMBOCOR) 100 MG tablet, Take 1 tablet (100 mg total) by mouth 2 (two) times daily., Disp: , Rfl:  .  furosemide (LASIX) 20 MG tablet, Take 1 tablet (20 mg total) by mouth daily. May stop once swelling is gone, Disp: 90 tablet, Rfl: 3 .  Garlic 1700 MG CAPS, Take 2,000 mg by mouth as needed. , Disp: , Rfl:  .  hydrALAZINE (APRESOLINE) 25 MG tablet, Take 1 tablet (25 mg total) by mouth every 8 (eight) hours., Disp: , Rfl:  .  Multiple Vitamins-Minerals (CENTRUM ADULTS PO), Take 1 capsule by mouth daily. ,  Disp: , Rfl:  .  Omega-3 Fatty Acids (FISH OIL) 1000 MG CAPS, Take 1 capsule by mouth every morning. , Disp: , Rfl:  .  potassium chloride (K-DUR) 10 MEQ tablet, Take 1 tablet (10 mEq total) by mouth daily. May stop once swelling is gone, Disp: 90 tablet, Rfl: 1 .  pyridostigmine (MESTINON) 60 MG tablet, Take 1 tablet (60 mg total) by mouth 2 (two) times daily before a meal., Disp: , Rfl:  .  tamsulosin (FLOMAX) 0.4 MG CAPS capsule, TAKE ONE CAPSULE BY MOUTH EVERY DAY, Disp: 90 capsule, Rfl: 1 .  testosterone (ANDROGEL) 50 MG/5GM (1%) GEL, APPLY 1 PACKET ONTO THE SKIN ONCE DAILY AS DIRECTED, Disp: 150 g, Rfl: 0 No current facility-administered medications for this visit.  Facility-Administered Medications Ordered in Other Visits:  .  ferumoxytol (FERAHEME) 510 mg in sodium chloride 0.9 % 100 mL IVPB, 510 mg, Intravenous, Once, Volanda Napoleon, MD .  methylPREDNISolone sodium succinate (SOLU-MEDROL) injection 1,000 mg, 1,000 mg, Intravenous, Once, Donika K Patel, DO .  methylPREDNISolone sodium succinate (SOLU-MEDROL) injection 1,000 mg, 1,000 mg, Intravenous, Once, Donika K Patel, DO .  methylPREDNISolone sodium succinate (SOLU-MEDROL) injection 1,000 mg, 1,000 mg, Intravenous, Once, Donika K Patel, DO .  methylPREDNISolone sodium succinate (SOLU-MEDROL) injection 1,000 mg, 1,000 mg, Intravenous, Once, Donika Keith Rake, DO  Allergies: No Known Allergies  Past Medical History,  Surgical history, Social history, and Family History were reviewed and updated.  Review of Systems: As above  Physical Exam:  height is 5\' 10"  (1.778 m) and weight is 190 lb (86.183 kg). His oral temperature is 98.6 F (37 C). His blood pressure is 149/77 and his pulse is 107. His respiration is 18.   Well-developed and well-nourished African regular. Head and neck exam shows no ocular or oral lesion. He has no palpable cervical or supraclavicular lymph nodes. Lungs are clear bilaterally. Cardiac exam regular rate and  rhythm with no murmurs rubs or bruits. Abdomen is soft. Has good bowel sounds. There is no fluid wave. There is no palpable liver or spleen tip. Rectal exam shows severe external hemorrhoids. There is no mass in the rectal vault. His stool is slightly heme positive. Back exam no tenderness over the spine ribs or hips. He has no tenderness over the muscles. Extremities shows no clubbing cyanosis or edema. He has good strength in his extremities. Good range of motion of his joints. Skin exam shows no rashes, ecchymosis or petechia. Neurological exam shows no focal neurological deficits.  Lab Results  Component Value Date   WBC 3.5* 09/15/2014   HGB 9.7* 09/15/2014   HCT 28.8* 09/15/2014   MCV 80* 09/15/2014   PLT 179 09/15/2014     Chemistry      Component Value Date/Time   NA 134* 08/14/2014 1452   NA 142 05/23/2014 1047   K 4.0 08/14/2014 1452   K 3.7 05/23/2014 1047   CL 105 08/14/2014 1452   CL 104 05/23/2014 1047   CO2 22 08/14/2014 1452   CO2 28 05/23/2014 1047   BUN <5* 08/14/2014 1452   BUN 10 05/23/2014 1047   CREATININE 0.91 08/14/2014 1452   CREATININE 1.2 05/23/2014 1047      Component Value Date/Time   CALCIUM 8.8 08/14/2014 1452   CALCIUM 9.1 05/23/2014 1047   ALKPHOS 77 08/12/2014 1030   ALKPHOS 77 05/23/2014 1047   AST 71* 08/12/2014 1030   AST 271* 05/23/2014 1047   ALT 46 08/12/2014 1030   ALT 180* 05/23/2014 1047   BILITOT 1.0 08/12/2014 1030   BILITOT 0.60 05/23/2014 1047        Impression and Plan: Jonathon [name] is a 79 year old male with iron deficiency anemia. I spent that he probably is bleeding again. His hemoglobin is quite low. His MCV is down. He has a significant external hemorrhoids. His stool was slightly heme positive. His last colonoscopy was 2 years ago.  We will go ahead and give him a dose of IV iron. This worked before.  I will like to get him back in another 4 weeks so we can see how his blood is responding.  Hopefully, the IVIG can be  given to him and can help his neuropathy. Volanda Napoleon, MD 5/5/20169:06 AM

## 2014-09-16 ENCOUNTER — Other Ambulatory Visit: Payer: Self-pay | Admitting: Internal Medicine

## 2014-09-18 ENCOUNTER — Emergency Department (HOSPITAL_COMMUNITY)
Admission: EM | Admit: 2014-09-18 | Discharge: 2014-09-18 | Disposition: A | Discharge status: Home / Self Care | Payer: Medicare Other | Attending: Emergency Medicine | Admitting: Emergency Medicine

## 2014-09-18 ENCOUNTER — Encounter (HOSPITAL_COMMUNITY): Payer: Self-pay | Admitting: Emergency Medicine

## 2014-09-18 ENCOUNTER — Other Ambulatory Visit: Payer: Self-pay

## 2014-09-18 DIAGNOSIS — I10 Essential (primary) hypertension: Secondary | ICD-10-CM | POA: Diagnosis not present

## 2014-09-18 DIAGNOSIS — Z79899 Other long term (current) drug therapy: Secondary | ICD-10-CM | POA: Diagnosis not present

## 2014-09-18 DIAGNOSIS — J45909 Unspecified asthma, uncomplicated: Secondary | ICD-10-CM | POA: Insufficient documentation

## 2014-09-18 DIAGNOSIS — Z950 Presence of cardiac pacemaker: Secondary | ICD-10-CM | POA: Insufficient documentation

## 2014-09-18 DIAGNOSIS — Z87442 Personal history of urinary calculi: Secondary | ICD-10-CM | POA: Diagnosis not present

## 2014-09-18 DIAGNOSIS — R404 Transient alteration of awareness: Secondary | ICD-10-CM | POA: Diagnosis not present

## 2014-09-18 DIAGNOSIS — Z87891 Personal history of nicotine dependence: Secondary | ICD-10-CM | POA: Insufficient documentation

## 2014-09-18 DIAGNOSIS — Z862 Personal history of diseases of the blood and blood-forming organs and certain disorders involving the immune mechanism: Secondary | ICD-10-CM | POA: Insufficient documentation

## 2014-09-18 DIAGNOSIS — R55 Syncope and collapse: Secondary | ICD-10-CM | POA: Insufficient documentation

## 2014-09-18 DIAGNOSIS — Z87448 Personal history of other diseases of urinary system: Secondary | ICD-10-CM | POA: Insufficient documentation

## 2014-09-18 LAB — I-STAT CHEM 8, ED
BUN: 9 mg/dL (ref 6–20)
CREATININE: 1.1 mg/dL (ref 0.61–1.24)
Calcium, Ion: 1.17 mmol/L (ref 1.13–1.30)
Chloride: 103 mmol/L (ref 101–111)
Glucose, Bld: 129 mg/dL — ABNORMAL HIGH (ref 70–99)
HCT: 32 % — ABNORMAL LOW (ref 39.0–52.0)
Hemoglobin: 10.9 g/dL — ABNORMAL LOW (ref 13.0–17.0)
Potassium: 4.4 mmol/L (ref 3.5–5.1)
Sodium: 138 mmol/L (ref 135–145)
TCO2: 20 mmol/L (ref 0–100)

## 2014-09-18 NOTE — ED Provider Notes (Signed)
CSN: [id number]     Arrival date & time 09/18/14  1217 History   First MD Initiated Contact with Patient 09/18/14 1219     Chief Complaint  Patient presents with  . Loss of Consciousness     (Consider location/radiation/quality/duration/timing/severity/associated sxs/prior Treatment) HPI Comments: The patient is a 79 year old male who has a known history of recurrent syncope related to orthostatic hypotension, he also is known to have a demyelinating polyneuropathy, he follows closely with Dr. Crissie Sickles of the cardiology service because of a pacemaker and has recently followed up with the neurology service, he has had multiple visits to their office is secondary to his polyneuropathy. Patient reports that he had awoken this morning, he did not have anything to eat or drink, he went to the store to buy some food and states that he was standing in line for a long time, he did not bring his walker with him and thus as he became generally weak and eventually had a syncopal event he had nothing to hold himself up. Paramedics report that he was initially diaphoretic and hypotensive, he had a recurrent episode when he tried to get up or he became hypotensive and diaphoretic again, once he was in the stretcher and being transported to the hospital he became asymptomatic and has no complaints at this time.  Patient is a 79 y.o. male presenting with syncope. The history is provided by the patient and the EMS personnel.  Loss of Consciousness   Past Medical History  Diagnosis Date  . BENIGN PROSTATIC HYPERTROPHY, HX OF 10/25/2008  . BRADYCARDIA 2005  . HYPERTENSION 11/21/2006  . 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 PACEMAKER, PERMANENT 2005    Gen change 2014 Medtronic Adaptic L dual-chamber pacemaker, serial [id number]   . TOBACCO ABUSE 10/25/2008    Quit 2012  . Iron deficiency anemia, unspecified 04/19/2013  . Asthma    Past Surgical History  Procedure Laterality Date   . Pacemaker insertion  2005  . Colonoscopy  [id number]  . Skin graft Right 1962    wrist  . Permanent pacemaker generator change N/A 06/19/2012    Procedure: PERMANENT PACEMAKER GENERATOR CHANGE;  Surgeon: [name], MD; Medtronic Adaptic L dual-chamber pacemaker, serial #[id number]     Family History  Problem Relation Age of Onset  . Breast cancer Sister     Living, 26  . Diabetes type II Brother     Living, 18  . Breast cancer Sister     Living, 79  . Heart attack Father     Died, 48  . Kidney disease Father   . Parkinson's disease Mother     Died, 66  . Diabetes Daughter     Living, 51  . Diabetes Son     Living, 52  . Colon cancer Neg Hx   . Rectal cancer Neg Hx   . Stomach cancer Neg Hx   . Ovarian cancer Daughter    History  Substance Use Topics  . Smoking status: Former Smoker -- 0.25 packs/day for 46 years    Types: Cigarettes    Start date: 03/16/1965    Quit date: 06/15/2010  . Smokeless tobacco: Never Used     Comment: quit 3 years ago  . Alcohol Use: 0.0 oz/week    0 Standard drinks or equivalent per week     Comment: 4 beers per day    Review of Systems  Cardiovascular: Positive for syncope.  All other systems reviewed  and are negative.     Allergies  Review of patient's allergies indicates no known allergies.  Home Medications   Prior to Admission medications   Medication Sig Start Date End Date Taking? Authorizing Provider  Cyanocobalamin (VITAMIN B 12 PO) Take 1,000 mg by mouth daily.   Yes Historical Provider, MD  diltiazem (CARDIZEM LA) 240 MG 24 hr tablet Take 1 tablet (240 mg total) by mouth daily. 01/14/13  Yes Marletta Lor, MD  flecainide (TAMBOCOR) 100 MG tablet Take 1 tablet (100 mg total) by mouth 2 (two) times daily. 08/15/14  Yes Hosie Poisson, MD  furosemide (LASIX) 20 MG tablet Take 1 tablet (20 mg total) by mouth daily. May stop once swelling is gone 09/09/14  Yes [name], MD  Garlic 3903 MG CAPS Take 2,000 mg by  mouth as needed.    Yes Historical Provider, MD  hydrALAZINE (APRESOLINE) 25 MG tablet Take 1 tablet (25 mg total) by mouth every 8 (eight) hours. 08/15/14  Yes Hosie Poisson, MD  irbesartan (AVAPRO) 300 MG tablet TAKE 1 TABLET BY MOUTH DAILY 09/16/14  Yes Marletta Lor, MD  Multiple Vitamins-Minerals (CENTRUM ADULTS PO) Take 1 capsule by mouth daily.    Yes Historical Provider, MD  Omega-3 Fatty Acids (FISH OIL) 1000 MG CAPS Take 1 capsule by mouth every morning.    Yes Historical Provider, MD  potassium chloride (K-DUR) 10 MEQ tablet Take 1 tablet (10 mEq total) by mouth daily. May stop once swelling is gone 09/09/14  Yes [name], MD  pyridostigmine (MESTINON) 60 MG tablet Take 1 tablet (60 mg total) by mouth 2 (two) times daily before a meal. Patient taking differently: Take 30 mg by mouth 2 (two) times daily before a meal.  08/15/14  Yes Hosie Poisson, MD  tamsulosin (FLOMAX) 0.4 MG CAPS capsule TAKE ONE CAPSULE BY MOUTH EVERY DAY 09/01/14  Yes Marletta Lor, MD  testosterone (ANDROGEL) 50 MG/5GM (1%) GEL APPLY 1 PACKET ONTO THE SKIN ONCE DAILY AS DIRECTED 05/24/14  Yes Volanda Napoleon, MD   BP 149/67 mmHg  Pulse 96  Temp(Src) 98.1 F (36.7 C) (Oral)  Resp 17  SpO2 97% Physical Exam  Constitutional: He appears well-developed and well-nourished. No distress.  HENT:  Head: Normocephalic and atraumatic.  Mouth/Throat: Oropharynx is clear and moist. No oropharyngeal exudate.  Eyes: Conjunctivae and EOM are normal. Pupils are equal, round, and reactive to light. Right eye exhibits no discharge. Left eye exhibits no discharge. No scleral icterus.  Neck: Normal range of motion. Neck supple. No JVD present. No thyromegaly present.  Cardiovascular: Normal rate, regular rhythm, normal heart sounds and intact distal pulses.  Exam reveals no gallop and no friction rub.   No murmur heard. Pulmonary/Chest: Effort normal and breath sounds normal. No respiratory distress. He has no wheezes. He  has no rales.  Abdominal: Soft. Bowel sounds are normal. He exhibits no distension and no mass. There is no tenderness.  Musculoskeletal: Normal range of motion. He exhibits no edema or tenderness.  Lymphadenopathy:    He has no cervical adenopathy.  Neurological: He is alert. Coordination normal.  No facial droop, normal strength in all 4 extremities, normal speech and coordination  Skin: Skin is warm and dry. No rash noted. No erythema.  Psychiatric: He has a normal mood and affect. His behavior is normal.  Nursing note and vitals reviewed.   ED Course  Procedures (including critical care time) Labs Review Labs Reviewed  I-STAT CHEM  8, ED - Abnormal; Notable for the following:    Glucose, Bld 129 (*)    Hemoglobin 10.9 (*)    HCT 32.0 (*)    All other components within normal limits    Imaging Review No results found.   EKG Interpretation   Date/Time:  Sunday Sep 18 2014 12:25:39 EDT Ventricular Rate:  86 PR Interval:    QRS Duration: 155 QT Interval:  431 QTC Calculation: 516 R Axis:   107 Text Interpretation:  Normal sinus rhythm Right bundle branch block  Non-specific ST-t changes Abnormal ekg since last tracing no significant  change Confirmed by Mataeo Ingwersen  MD, Selim Durden ([id number]) on 09/18/2014 12:35:17 PM      MDM   Final diagnoses:  Syncope, unspecified syncope type    The patient is very well-appearing, his EKG is totally unchanged from prior EKGs, will interrogate his pacemaker to ensure that is working correctly, check basic labs, the patient is not acutely ill, he did not have breakfast, he will receive some food and fluid, anticipate discharge as the patient does have a history of chronic syncope related to orthostasis. He does not appear ill  Labs normal Pacer interrogated - normal without dysfunction No recurrent sx  Orthostatics stable, has been given meal and PO fluids without difficulty  Stable for d/c.    Noemi Chapel, MD 09/18/14 1435

## 2014-09-18 NOTE — Discharge Instructions (Signed)
Your testing is normal - please use your walker when you walk - see your doctor this week.  Please call your doctor for a followup appointment within 24-48 hours. When you talk to your doctor please let them know that you were seen in the emergency department and have them acquire all of your records so that they can discuss the findings with you and formulate a treatment plan to fully care for your new and ongoing problems.

## 2014-09-18 NOTE — ED Notes (Signed)
NAD at this time.  

## 2014-09-18 NOTE — ED Notes (Signed)
Per EMS, pt coming in today for a syncopal episode which occurred while standing in line at the grocery store. Pt said that he began to feel weak and diaphoretic and then passed out. Pt did not hit his head. After the fire department arrived the pt had another syncopal episode and his BP was 80/50 before the epsidoe, pts BP improved to 150/90 for EMS with no intervention. Pt reports the hx of the same. Pt alert x4 at this time. NAD at this time.

## 2014-09-20 ENCOUNTER — Encounter (HOSPITAL_COMMUNITY)
Admission: RE | Admit: 2014-09-20 | Discharge: 2014-09-20 | Disposition: A | Discharge status: Home / Self Care | Payer: Medicare Other | Source: Ambulatory Visit | Attending: Neurology | Admitting: Neurology

## 2014-09-20 ENCOUNTER — Encounter (HOSPITAL_COMMUNITY)
Admission: RE | Admit: 2014-09-20 | Discharge: 2014-09-20 | Disposition: A | Discharge status: Home / Self Care | Payer: Medicare Other | Source: Ambulatory Visit | Attending: Nephrology | Admitting: Nephrology

## 2014-09-20 DIAGNOSIS — G610 Guillain-Barre syndrome: Secondary | ICD-10-CM | POA: Diagnosis not present

## 2014-09-20 DIAGNOSIS — G6181 Chronic inflammatory demyelinating polyneuritis: Secondary | ICD-10-CM | POA: Insufficient documentation

## 2014-09-20 LAB — LACTATE DEHYDROGENASE: LDH: 237 U/L (ref 94–250)

## 2014-09-20 LAB — PROTEIN ELECTROPHORESIS, SERUM, WITH REFLEX
ALPHA-1-GLOBULIN: 0.4 g/dL — AB (ref 0.2–0.3)
Albumin ELP: 3.4 g/dL — ABNORMAL LOW (ref 3.8–4.8)
Alpha-2-Globulin: 0.8 g/dL (ref 0.5–0.9)
BETA 2: 0.7 g/dL — AB (ref 0.2–0.5)
Beta Globulin: 0.5 g/dL (ref 0.4–0.6)
GAMMA GLOBULIN: 1.2 g/dL (ref 0.8–1.7)
Total Protein, Serum Electrophoresis: 7 g/dL (ref 6.1–8.1)

## 2014-09-20 LAB — KAPPA/LAMBDA LIGHT CHAINS
KAPPA FREE LGHT CHN: 4.43 mg/dL — AB (ref 0.33–1.94)
Kappa:Lambda Ratio: 1.15 (ref 0.26–1.65)
Lambda Free Lght Chn: 3.86 mg/dL — ABNORMAL HIGH (ref 0.57–2.63)

## 2014-09-20 LAB — IGG, IGA, IGM
IGG (IMMUNOGLOBIN G), SERUM: 1370 mg/dL (ref 650–1600)
IgA: 629 mg/dL — ABNORMAL HIGH (ref 68–379)
IgM, Serum: 99 mg/dL (ref 41–251)

## 2014-09-20 LAB — IFE INTERPRETATION

## 2014-09-20 MED ORDER — SODIUM CHLORIDE 0.9 % IV SOLN
1000.0000 mg | INTRAVENOUS | Status: DC
  Administered 2014-09-20: 1000 mg via INTRAVENOUS
  Filled 2014-09-20: qty 8

## 2014-09-21 LAB — FLECAINIDE LEVEL: FLECAINIDE: 0.3 ug/mL (ref 0.20–1.00)

## 2014-09-22 ENCOUNTER — Encounter: Payer: Private Health Insurance - Indemnity | Admitting: Internal Medicine

## 2014-09-22 ENCOUNTER — Telehealth: Payer: Self-pay | Admitting: *Deleted

## 2014-09-22 NOTE — Telephone Encounter (Addendum)
Patient aware of results  ----- Message from Volanda Napoleon, MD sent at 09/21/2014  6:10 PM EDT ----- Please call and tell him that we don't see any abnormal protein in his blood right now. Thanks

## 2014-09-27 ENCOUNTER — Telehealth: Payer: Self-pay | Admitting: *Deleted

## 2014-09-27 NOTE — Telephone Encounter (Signed)
Patient came in to ask if he needed to continue taking med given to him at the hospital for high BP.  Instructed him to call his PCP or Cardiologist per Dr. Posey Pronto to see if they still want him on it.  Patient agreed to do so.

## 2014-10-13 ENCOUNTER — Ambulatory Visit: Payer: Medicare Other | Admitting: Family

## 2014-10-13 ENCOUNTER — Other Ambulatory Visit: Payer: Medicare Other

## 2014-10-13 ENCOUNTER — Ambulatory Visit: Payer: Medicare Other

## 2014-10-24 ENCOUNTER — Ambulatory Visit: Payer: Private Health Insurance - Indemnity | Admitting: Neurology

## 2014-10-28 ENCOUNTER — Other Ambulatory Visit: Payer: Self-pay | Admitting: *Deleted

## 2014-10-31 ENCOUNTER — Other Ambulatory Visit: Payer: Self-pay | Admitting: *Deleted

## 2014-10-31 ENCOUNTER — Encounter: Payer: Self-pay | Admitting: *Deleted

## 2014-11-01 ENCOUNTER — Encounter: Payer: Self-pay | Admitting: *Deleted

## 2014-11-03 ENCOUNTER — Other Ambulatory Visit: Payer: Self-pay | Admitting: *Deleted

## 2014-11-03 DIAGNOSIS — G6181 Chronic inflammatory demyelinating polyneuritis: Secondary | ICD-10-CM

## 2014-11-03 MED ORDER — IMMUNE GLOBULIN (HUMAN) 10 GM/100ML IV SOLN: 1.0000 g/kg | INTRAVENOUS | Status: DC

## 2014-11-03 MED ORDER — IMMUNE GLOBULIN (HUMAN) 10 GM/100ML IV SOLN: 2.0000 g/kg | INTRAVENOUS | Status: AC

## 2014-11-04 NOTE — Addendum Note (Signed)
Addended by: Roberts Gaudy on: 11/04/2014 08:07 AM   Modules accepted: Orders

## 2014-11-08 ENCOUNTER — Other Ambulatory Visit: Payer: Self-pay | Admitting: *Deleted

## 2014-12-05 ENCOUNTER — Encounter: Payer: Self-pay | Admitting: Internal Medicine

## 2014-12-05 ENCOUNTER — Ambulatory Visit (INDEPENDENT_AMBULATORY_CARE_PROVIDER_SITE_OTHER): Payer: Medicare Other | Admitting: Internal Medicine

## 2014-12-05 VITALS — BP 132/84 | HR 97 | Ht 70.0 in | Wt 175.8 lb

## 2014-12-05 DIAGNOSIS — I471 Supraventricular tachycardia: Secondary | ICD-10-CM

## 2014-12-05 DIAGNOSIS — R001 Bradycardia, unspecified: Secondary | ICD-10-CM | POA: Diagnosis not present

## 2014-12-05 DIAGNOSIS — I10 Essential (primary) hypertension: Secondary | ICD-10-CM

## 2014-12-05 DIAGNOSIS — Z950 Presence of cardiac pacemaker: Secondary | ICD-10-CM

## 2014-12-05 LAB — CUP PACEART INCLINIC DEVICE CHECK
Battery Voltage: 2.78 V
Brady Statistic AP VS Percent: 0 %
Brady Statistic AS VP Percent: 19 %
Date Time Interrogation Session: 20160725120131
Lead Channel Pacing Threshold Pulse Width: 0.4 ms
Lead Channel Sensing Intrinsic Amplitude: 4 mV
Lead Channel Sensing Intrinsic Amplitude: 5.6 mV
Lead Channel Setting Pacing Pulse Width: 0.4 ms
MDC IDC MSMT BATTERY IMPEDANCE: 206 Ohm
MDC IDC MSMT BATTERY REMAINING LONGEVITY: 131 mo
MDC IDC MSMT LEADCHNL RA IMPEDANCE VALUE: 480 Ohm
MDC IDC MSMT LEADCHNL RA PACING THRESHOLD AMPLITUDE: 0.75 V
MDC IDC MSMT LEADCHNL RA PACING THRESHOLD PULSEWIDTH: 0.4 ms
MDC IDC MSMT LEADCHNL RV IMPEDANCE VALUE: 619 Ohm
MDC IDC MSMT LEADCHNL RV PACING THRESHOLD AMPLITUDE: 0.5 V
MDC IDC SET LEADCHNL RA PACING AMPLITUDE: 2 V
MDC IDC SET LEADCHNL RV PACING AMPLITUDE: 2.5 V
MDC IDC SET LEADCHNL RV SENSING SENSITIVITY: 2 mV
MDC IDC STAT BRADY AP VP PERCENT: 0 %
MDC IDC STAT BRADY AS VS PERCENT: 81 %

## 2014-12-05 NOTE — Assessment & Plan Note (Signed)
He has had no recurrent SVT (likely atrial tachycardia) since starting flecainide. He will continue his current meds.

## 2014-12-05 NOTE — Progress Notes (Signed)
HPI [name] returns today for followup. He is a pleasant 79 yo man with a h/o symptomatic bradycardia, s/p PPM. He has had problems with weakness, muscle loss and fatigue and has been diagnosed with a chronic demyelinating disorder. He also notes his palpitations have improved since starting flecainide. Overall he feels better although he has had some chronic peripheral edema which is new. He denies dietary indiscretion with sodium. He is using a walker to help with ambulation and to prevent falls.  No Known Allergies   Current Outpatient Prescriptions  Medication Sig Dispense Refill  . Cyanocobalamin (VITAMIN B 12 PO) Take 1,000 mg by mouth daily.    Marland Kitchen diltiazem (CARDIZEM LA) 240 MG 24 hr tablet Take 1 tablet (240 mg total) by mouth daily. 90 tablet 3  . flecainide (TAMBOCOR) 150 MG tablet Take 75 mg by mouth 2 (two) times daily.  2  . furosemide (LASIX) 20 MG tablet Take 1 tablet (20 mg total) by mouth daily. May stop once swelling is gone 90 tablet 3  . Garlic 0347 MG CAPS Take 2,000 mg by mouth as needed.     . tamsulosin (FLOMAX) 0.4 MG CAPS capsule TAKE ONE CAPSULE BY MOUTH EVERY DAY 90 capsule 1  . hydrALAZINE (APRESOLINE) 25 MG tablet Take 1 tablet (25 mg total) by mouth every 8 (eight) hours. (Patient not taking: Reported on 12/05/2014)    . Omega-3 Fatty Acids (FISH OIL) 1000 MG CAPS Take 1 capsule by mouth every morning.     . potassium chloride (K-DUR) 10 MEQ tablet Take 1 tablet (10 mEq total) by mouth daily. May stop once swelling is gone (Patient not taking: Reported on 12/05/2014) 90 tablet 1  . pyridostigmine (MESTINON) 60 MG tablet Take 1 tablet (60 mg total) by mouth 2 (two) times daily before a meal. (Patient not taking: Reported on 12/05/2014)    . testosterone (ANDROGEL) 50 MG/5GM (1%) GEL APPLY 1 PACKET ONTO THE SKIN ONCE DAILY AS DIRECTED (Patient not taking: Reported on 12/05/2014) 150 g 0   No current facility-administered medications for this visit.    Facility-Administered Medications Ordered in Other Visits  Medication Dose Route Frequency Provider Last Rate Last Dose  . Immune Globulin 10% (OCTAGAM) IV infusion 85 g  1 g/kg Intravenous Q30 days Donika K Patel, DO      . methylPREDNISolone sodium succinate (SOLU-MEDROL) injection 1,000 mg  1,000 mg Intravenous Once Donika K Patel, DO      . methylPREDNISolone sodium succinate (SOLU-MEDROL) injection 1,000 mg  1,000 mg Intravenous Once Donika K Patel, DO      . methylPREDNISolone sodium succinate (SOLU-MEDROL) injection 1,000 mg  1,000 mg Intravenous Once Donika K Patel, DO      . methylPREDNISolone sodium succinate (SOLU-MEDROL) injection 1,000 mg  1,000 mg Intravenous Once [name], DO         Past Medical History  Diagnosis Date  . BENIGN PROSTATIC HYPERTROPHY, HX OF 10/25/2008  . BRADYCARDIA 2005  . HYPERTENSION 11/21/2006  . 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 PACEMAKER, PERMANENT 2005    Gen change 2014 Medtronic Adaptic L dual-chamber pacemaker, serial [id number]   . TOBACCO ABUSE 10/25/2008    Quit 2012  . Iron deficiency anemia, unspecified 04/19/2013  . Asthma     ROS:   All systems reviewed and negative except as noted in the HPI.   Past Surgical History  Procedure Laterality Date  . Pacemaker insertion  2005  . Colonoscopy  [id number]  . Skin graft Right 1962    wrist  . Permanent pacemaker generator change N/A 06/19/2012    Procedure: PERMANENT PACEMAKER GENERATOR CHANGE;  Surgeon: [name], MD; Medtronic Adaptic L dual-chamber pacemaker, serial #[id number]       Family History  Problem Relation Age of Onset  . Breast cancer Sister     Living, 47  . Diabetes type II Brother     Living, 72  . Breast cancer Sister     Living, 63  . Heart attack Father     Died, 15  . Kidney disease Father   . Parkinson's disease Mother     Died, 83  . Diabetes Daughter     Living, 58  . Diabetes Son     Living, 108  . Colon  cancer Neg Hx   . Rectal cancer Neg Hx   . Stomach cancer Neg Hx   . Ovarian cancer Daughter      History   Social History  . Marital Status: Divorced    Spouse Name: N/A  . Number of Children: 4  . Years of Education: N/A   Occupational History  . ENVIROMENTAL SERVICE    Social History Main Topics  . Smoking status: Former Smoker -- 0.25 packs/day for 46 years    Types: Cigarettes    Start date: 03/16/1965    Quit date: 06/15/2010  . Smokeless tobacco: Never Used     Comment: quit 3 years ago  . Alcohol Use: 0.0 oz/week    0 Standard drinks or equivalent per week     Comment: 4 beers per day  . Drug Use: No  . Sexual Activity: Not on file   Other Topics Concern  . 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     BP 132/84 mmHg  Pulse 97  Ht 5\' 10"  (1.778 m)  Wt 175 lb 12.8 oz (79.742 kg)  BMI 25.22 kg/m2  SpO2 96%  Physical Exam:  anxious appearing 79 yo man, NAD HEENT: Unremarkable Neck:  8 cm JVD, no thyromegally Back:  No CVA tenderness Lungs:  scattered rales, well healed PPM incision, some increased work of breathing HEART:  Regular tachy rhythm, no murmurs, no rubs, no clicks, precordium is shaking Abd:  soft, positive bowel sounds, no organomegally, no rebound, no guarding Ext:  2 plus pulses, 2+ peripheral edema, no cyanosis, no clubbing Skin:  No rashes no nodules Neuro:  CN II through XII intact, motor grossly intact  DEVICE  Normal device function.  See PaceArt for details.   Assess/Plan:

## 2014-12-05 NOTE — Assessment & Plan Note (Signed)
His blood pressure is stable. With his history of orthostasis, will not attempt to drive blood pressure too low.

## 2014-12-05 NOTE — Patient Instructions (Signed)
Medication Instructions:  Your physician recommends that you continue on your current medications as directed. Please refer to the Current Medication list given to you today.   Labwork: NONE  Testing/Procedures: NONE  Follow-Up: Remote monitoring is used to monitor your Pacemaker or ICD from home. This monitoring reduces the number of office visits required to check your device to one time per year. It allows [place] to keep an eye on the functioning of your device to ensure it is working properly. You are scheduled for a device check from home on 03/06/2015. You may send your transmission at any time that day. If you have a wireless device, the transmission will be sent automatically. After your physician reviews your transmission, you will receive a postcard with your next transmission date.  Your physician wants you to follow-up in: 12 months with Dr. Lovena Le. You will receive a reminder letter in the mail two months in advance. If you don't receive a letter, please call our office to schedule the follow-up appointment.   Any Other Special Instructions Will Be Listed Below (If Applicable).

## 2014-12-05 NOTE — Assessment & Plan Note (Signed)
His medtronic DDD PM is working normally. Will recheck in several months. 

## 2014-12-09 ENCOUNTER — Encounter: Payer: Medicare Other | Admitting: Internal Medicine

## 2014-12-19 ENCOUNTER — Other Ambulatory Visit: Payer: Self-pay | Admitting: Internal Medicine

## 2015-01-02 ENCOUNTER — Telehealth: Payer: Self-pay | Admitting: Neurology

## 2015-01-02 NOTE — Telephone Encounter (Signed)
Despite multiple attempts at trying to get IVIG approved, his insurance will not cover it.  I personally called and spoke to patient who states that he discontinue steroid infusion due to lack of improvement and is no longer interested in IVIG.  He is trying alternative treatment with physical therapy and chiropractic care and feels that it is helping some.  I offered oral agents, such as Imuran, but he would like to think about it.  He will call to schedule return visit, as needed.  Donika K. Posey Pronto, DO

## 2015-01-30 ENCOUNTER — Ambulatory Visit (INDEPENDENT_AMBULATORY_CARE_PROVIDER_SITE_OTHER): Payer: Medicare Other | Admitting: Internal Medicine

## 2015-01-30 ENCOUNTER — Encounter: Payer: Self-pay | Admitting: Internal Medicine

## 2015-01-30 VITALS — BP 146/90 | HR 103 | Temp 98.4°F | Resp 20 | Ht 70.0 in | Wt 181.0 lb

## 2015-01-30 DIAGNOSIS — D509 Iron deficiency anemia, unspecified: Secondary | ICD-10-CM | POA: Diagnosis not present

## 2015-01-30 DIAGNOSIS — G610 Guillain-Barre syndrome: Secondary | ICD-10-CM | POA: Diagnosis not present

## 2015-01-30 DIAGNOSIS — R7989 Other specified abnormal findings of blood chemistry: Secondary | ICD-10-CM

## 2015-01-30 DIAGNOSIS — R945 Abnormal results of liver function studies: Secondary | ICD-10-CM

## 2015-01-30 DIAGNOSIS — Z23 Encounter for immunization: Secondary | ICD-10-CM

## 2015-01-30 DIAGNOSIS — I10 Essential (primary) hypertension: Secondary | ICD-10-CM | POA: Diagnosis not present

## 2015-01-30 DIAGNOSIS — Z950 Presence of cardiac pacemaker: Secondary | ICD-10-CM

## 2015-01-30 LAB — CBC WITH DIFFERENTIAL/PLATELET
Basophils Absolute: 0 10*3/uL (ref 0.0–0.1)
Basophils Relative: 0.1 % (ref 0.0–3.0)
EOS ABS: 0 10*3/uL (ref 0.0–0.7)
Eosinophils Relative: 0.2 % (ref 0.0–5.0)
HCT: 38.3 % — ABNORMAL LOW (ref 39.0–52.0)
Hemoglobin: 12.6 g/dL — ABNORMAL LOW (ref 13.0–17.0)
Lymphocytes Relative: 10.7 % — ABNORMAL LOW (ref 12.0–46.0)
Lymphs Abs: 0.5 10*3/uL — ABNORMAL LOW (ref 0.7–4.0)
MCHC: 33 g/dL (ref 30.0–36.0)
MCV: 87.1 fl (ref 78.0–100.0)
MONO ABS: 0.6 10*3/uL (ref 0.1–1.0)
Monocytes Relative: 11.6 % (ref 3.0–12.0)
NEUTROS PCT: 77.4 % — AB (ref 43.0–77.0)
Neutro Abs: 3.8 10*3/uL (ref 1.4–7.7)
Platelets: 118 10*3/uL — ABNORMAL LOW (ref 150.0–400.0)
RBC: 4.39 Mil/uL (ref 4.22–5.81)
RDW: 16.6 % — ABNORMAL HIGH (ref 11.5–15.5)
WBC: 4.9 10*3/uL (ref 4.0–10.5)

## 2015-01-30 LAB — COMPREHENSIVE METABOLIC PANEL
ALBUMIN: 4 g/dL (ref 3.5–5.2)
ALK PHOS: 117 U/L (ref 39–117)
ALT: 96 U/L — AB (ref 0–53)
AST: 182 U/L — AB (ref 0–37)
BILIRUBIN TOTAL: 1.5 mg/dL — AB (ref 0.2–1.2)
BUN: 16 mg/dL (ref 6–23)
CO2: 28 mEq/L (ref 19–32)
CREATININE: 1.05 mg/dL (ref 0.40–1.50)
Calcium: 9.6 mg/dL (ref 8.4–10.5)
Chloride: 99 mEq/L (ref 96–112)
GFR: 88.08 mL/min (ref >60.00)
Glucose, Bld: 121 mg/dL — ABNORMAL HIGH (ref 70–99)
Potassium: 5.3 mEq/L — ABNORMAL HIGH (ref 3.5–5.1)
SODIUM: 137 meq/L (ref 135–145)
TOTAL PROTEIN: 8.1 g/dL (ref 6.0–8.3)

## 2015-01-30 NOTE — Patient Instructions (Signed)
Limit your sodium (Salt) intake  Please check your blood pressure on a regular basis.  If it is consistently greater than 150/90, please make an office appointment.  Follow-up cardiology and neurology  Bring all medications that you are taking for your next visit  Return in 6 months for follow-up

## 2015-01-30 NOTE — Progress Notes (Signed)
Pre visit review using our clinic review tool, if applicable. No additional management support is needed unless otherwise documented below in the visit note. 

## 2015-01-30 NOTE — Progress Notes (Signed)
Subjective:    Patient ID: Jonathon [name], male    DOB: [birth year], 79 y.o.   MRN: [id number]  HPI  BP Readings from Last 3 Encounters:  01/30/15 146/90  12/05/14 132/84  09/20/14 36/46    79 year old patient who is seen today for his biannual follow-up.  In the spring.  He was hospitalized for orthostatic hypotension.  He has been followed closely by cardiology and neurology. He is in a wheelchair today due to increasing leg weakness, which has been intermittent.  He does have a history of iron deficiency anemia.  He usually is ambulatory with a walker. Generally feels well today Medications were reviewed.  He has not taken a number of prior medications including Mestinon Apresoline, furosemide, Avapro Blood pressure last month or cardiology was normal  Past Medical History  Diagnosis Date  . BENIGN PROSTATIC HYPERTROPHY, HX OF 10/25/2008  . BRADYCARDIA 2005  . HYPERTENSION 11/21/2006  . 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 PACEMAKER, PERMANENT 2005    Gen change 2014 Medtronic Adaptic L dual-chamber pacemaker, serial [id number]   . TOBACCO ABUSE 10/25/2008    Quit 2012  . Iron deficiency anemia, unspecified 04/19/2013  . Asthma     Social History   Social History  . Marital Status: Divorced    Spouse Name: N/A  . Number of Children: 4  . Years of Education: N/A   Occupational History  . ENVIROMENTAL SERVICE    Social History Main Topics  . Smoking status: Former Smoker -- 0.25 packs/day for 46 years    Types: Cigarettes    Start date: 03/16/1965    Quit date: 06/15/2010  . Smokeless tobacco: Never Used     Comment: quit 3 years ago  . Alcohol Use: 0.0 oz/week    0 Standard drinks or equivalent per week     Comment: 4 beers per day  . Drug Use: No  . Sexual Activity: Not on file   Other Topics Concern  . 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      Past Surgical History  Procedure Laterality Date  . Pacemaker insertion  2005  . Colonoscopy  [id number]  . Skin graft Right 1962    wrist  . Permanent pacemaker generator change N/A 06/19/2012    Procedure: PERMANENT PACEMAKER GENERATOR CHANGE;  Surgeon: [name], MD; Medtronic Adaptic L dual-chamber pacemaker, serial #[id number]      Family History  Problem Relation Age of Onset  . Breast cancer Sister     Living, 66  . Diabetes type II Brother     Living, 61  . Breast cancer Sister     Living, 79  . Heart attack Father     Died, 15  . Kidney disease Father   . Parkinson's disease Mother     Died, 22  . Diabetes Daughter     Living, 30  . Diabetes Son     Living, 70  . Colon cancer Neg Hx   . Rectal cancer Neg Hx   . Stomach cancer Neg Hx   . Ovarian cancer Daughter     No Known Allergies  Current Outpatient Prescriptions on File Prior to Visit  Medication Sig Dispense Refill  . Cyanocobalamin (VITAMIN B 12 PO) Take 1,000 mg by mouth daily.    Marland Kitchen diltiazem (CARDIZEM LA) 240 MG 24 hr tablet  Take 1 tablet (240 mg total) by mouth daily. 90 tablet 3  . flecainide (TAMBOCOR) 150 MG tablet Take 75 mg by mouth 2 (two) times daily.  2  . Garlic 3646 MG CAPS Take 2,000 mg by mouth as needed.     . Omega-3 Fatty Acids (FISH OIL) 1000 MG CAPS Take 1 capsule by mouth every morning.     . tamsulosin (FLOMAX) 0.4 MG CAPS capsule TAKE ONE CAPSULE BY MOUTH EVERY DAY 90 capsule 1   Current Facility-Administered Medications on File Prior to Visit  Medication Dose Route Frequency Taytum Scheck Last Rate Last Dose  . Immune Globulin 10% (OCTAGAM) IV infusion 85 g  1 g/kg Intravenous Q30 days Donika K Patel, DO      . methylPREDNISolone sodium succinate (SOLU-MEDROL) injection 1,000 mg  1,000 mg Intravenous Once Donika K Patel, DO      . methylPREDNISolone sodium succinate (SOLU-MEDROL) injection 1,000 mg  1,000 mg Intravenous Once Donika K Patel, DO      . methylPREDNISolone sodium  succinate (SOLU-MEDROL) injection 1,000 mg  1,000 mg Intravenous Once Donika K Patel, DO      . methylPREDNISolone sodium succinate (SOLU-MEDROL) injection 1,000 mg  1,000 mg Intravenous Once Donika K Patel, DO        BP 146/90 mmHg  Pulse 103  Temp(Src) 98.4 F (36.9 C) (Oral)  Resp 20  Ht 5\' 10"  (1.778 m)  Wt 181 lb (82.101 kg)  BMI 25.97 kg/m2  SpO2 98%     Review of Systems  Constitutional: Negative for fever, chills, appetite change and fatigue.  HENT: Negative for congestion, dental problem, ear pain, hearing loss, sore throat, tinnitus, trouble swallowing and voice change.   Eyes: Negative for pain, discharge and visual disturbance.  Respiratory: Negative for cough, chest tightness, wheezing and stridor.   Cardiovascular: Negative for chest pain, palpitations and leg swelling.  Gastrointestinal: Negative for nausea, vomiting, abdominal pain, diarrhea, constipation, blood in stool and abdominal distention.  Genitourinary: Negative for urgency, hematuria, flank pain, discharge, difficulty urinating and genital sores.  Musculoskeletal: Positive for gait problem. Negative for myalgias, back pain, joint swelling, arthralgias and neck stiffness.  Skin: Negative for rash.  Neurological: Positive for weakness. Negative for dizziness, syncope, speech difficulty, numbness and headaches.  Hematological: Negative for adenopathy. Does not bruise/bleed easily.  Psychiatric/Behavioral: Negative for behavioral problems and dysphoric mood. The patient is not nervous/anxious.        Objective:   Physical Exam  Constitutional: He is oriented to person, place, and time. He appears well-developed.  Blood pressure 140/90  HENT:  Head: Normocephalic.  Right Ear: External ear normal.  Left Ear: External ear normal.  Eyes: Conjunctivae and EOM are normal.  Neck: Normal range of motion.  Cardiovascular: Normal rate and normal heart sounds.   Resting pulse rate 90-100  Pulmonary/Chest:  Breath sounds normal.  Abdominal: Bowel sounds are normal.  Musculoskeletal: Normal range of motion. He exhibits no edema or tenderness.  Neurological: He is alert and oriented to person, place, and time.  Psychiatric: He has a normal mood and affect. His behavior is normal.          Assessment & Plan:   Hypertension.  Will continue to observe on present medication.  Blood pressure last month, better.  Repeat blood pressure 140/88 CIDP.  Follow-up neurology History of sustained SVT.  Status post pacemaker insertion for symptomatic bradycardia History of anemia.  Will check a CBC History of LFT abnormalities.  Will check a follow-up  liver panel  Follow-up neurology, hematology and cardiology  Recheck here 6 months Low-salt diet recommended Home blood pressure monitoring.  Encouraged

## 2015-02-05 ENCOUNTER — Other Ambulatory Visit: Payer: Self-pay | Admitting: Internal Medicine

## 2015-03-06 ENCOUNTER — Ambulatory Visit (INDEPENDENT_AMBULATORY_CARE_PROVIDER_SITE_OTHER): Payer: Medicare Other | Admitting: *Deleted

## 2015-03-06 DIAGNOSIS — R001 Bradycardia, unspecified: Secondary | ICD-10-CM | POA: Diagnosis not present

## 2015-03-06 DIAGNOSIS — I495 Sick sinus syndrome: Secondary | ICD-10-CM

## 2015-03-07 NOTE — Progress Notes (Signed)
LOOP RECORDER  

## 2015-03-08 NOTE — Progress Notes (Signed)
Error below --- Remote pacemaker transmission.

## 2015-03-10 ENCOUNTER — Encounter: Payer: Self-pay | Admitting: Cardiology

## 2015-03-10 LAB — CUP PACEART REMOTE DEVICE CHECK
Battery Impedance: 182 Ohm
Battery Remaining Longevity: 137 mo
Brady Statistic AP VP Percent: 1 %
Brady Statistic AP VS Percent: 0 %
Brady Statistic AS VP Percent: 7 %
Brady Statistic AS VS Percent: 92 %
Date Time Interrogation Session: 20161024115617
Implantable Lead Implant Date: 20050121
Implantable Lead Location: 753859
Implantable Lead Location: 753860
Implantable Lead Model: 5076
Implantable Lead Model: 5076
Lead Channel Impedance Value: 534 Ohm
Lead Channel Impedance Value: 575 Ohm
Lead Channel Pacing Threshold Amplitude: 1.25 V
Lead Channel Pacing Threshold Pulse Width: 0.4 ms
Lead Channel Sensing Intrinsic Amplitude: 2.8 mV
Lead Channel Sensing Intrinsic Amplitude: 5.6 mV
MDC IDC LEAD IMPLANT DT: 20050121
MDC IDC MSMT BATTERY VOLTAGE: 2.78 V
MDC IDC MSMT LEADCHNL RV PACING THRESHOLD AMPLITUDE: 0.5 V
MDC IDC MSMT LEADCHNL RV PACING THRESHOLD PULSEWIDTH: 0.4 ms
MDC IDC SET LEADCHNL RA PACING AMPLITUDE: 2.5 V
MDC IDC SET LEADCHNL RV PACING AMPLITUDE: 2.5 V
MDC IDC SET LEADCHNL RV PACING PULSEWIDTH: 0.4 ms
MDC IDC SET LEADCHNL RV SENSING SENSITIVITY: 2.8 mV

## 2015-03-22 DIAGNOSIS — M1711 Unilateral primary osteoarthritis, right knee: Secondary | ICD-10-CM | POA: Diagnosis not present

## 2015-05-01 ENCOUNTER — Encounter: Payer: Self-pay | Admitting: Internal Medicine

## 2015-05-01 ENCOUNTER — Ambulatory Visit (INDEPENDENT_AMBULATORY_CARE_PROVIDER_SITE_OTHER): Payer: Medicare Other | Admitting: Internal Medicine

## 2015-05-01 VITALS — BP 152/90 | HR 87 | Temp 98.5°F | Resp 20 | Ht 70.0 in | Wt 200.0 lb

## 2015-05-01 DIAGNOSIS — R945 Abnormal results of liver function studies: Secondary | ICD-10-CM

## 2015-05-01 DIAGNOSIS — I10 Essential (primary) hypertension: Secondary | ICD-10-CM

## 2015-05-01 DIAGNOSIS — G610 Guillain-Barre syndrome: Secondary | ICD-10-CM | POA: Diagnosis not present

## 2015-05-01 DIAGNOSIS — R7989 Other specified abnormal findings of blood chemistry: Secondary | ICD-10-CM | POA: Diagnosis not present

## 2015-05-01 DIAGNOSIS — D509 Iron deficiency anemia, unspecified: Secondary | ICD-10-CM

## 2015-05-01 LAB — COMPREHENSIVE METABOLIC PANEL
ALBUMIN: 3.6 g/dL (ref 3.5–5.2)
ALK PHOS: 84 U/L (ref 39–117)
ALT: 54 U/L — ABNORMAL HIGH (ref 0–53)
AST: 57 U/L — ABNORMAL HIGH (ref 0–37)
BUN: 18 mg/dL (ref 6–23)
CALCIUM: 9.3 mg/dL (ref 8.4–10.5)
CO2: 28 mEq/L (ref 19–32)
Chloride: 105 mEq/L (ref 96–112)
Creatinine, Ser: 1.01 mg/dL (ref 0.40–1.50)
GFR: 92.06 mL/min (ref >60.00)
Glucose, Bld: 117 mg/dL — ABNORMAL HIGH (ref 70–99)
POTASSIUM: 4.3 meq/L (ref 3.5–5.1)
SODIUM: 140 meq/L (ref 135–145)
Total Bilirubin: 0.8 mg/dL (ref 0.2–1.2)
Total Protein: 6.9 g/dL (ref 6.0–8.3)

## 2015-05-01 LAB — CBC WITH DIFFERENTIAL/PLATELET
Basophils Absolute: 0 10*3/uL (ref 0.0–0.1)
Basophils Relative: 0.8 % (ref 0.0–3.0)
EOS PCT: 5.1 % — AB (ref 0.0–5.0)
Eosinophils Absolute: 0.2 10*3/uL (ref 0.0–0.7)
HEMATOCRIT: 38.6 % — AB (ref 39.0–52.0)
Hemoglobin: 12.7 g/dL — ABNORMAL LOW (ref 13.0–17.0)
LYMPHS PCT: 42.1 % (ref 12.0–46.0)
Lymphs Abs: 1.9 10*3/uL (ref 0.7–4.0)
MCHC: 32.8 g/dL (ref 30.0–36.0)
MCV: 87.1 fl (ref 78.0–100.0)
MONO ABS: 0.6 10*3/uL (ref 0.1–1.0)
Monocytes Relative: 12.8 % — ABNORMAL HIGH (ref 3.0–12.0)
Neutro Abs: 1.8 10*3/uL (ref 1.4–7.7)
Neutrophils Relative %: 39.2 % — ABNORMAL LOW (ref 43.0–77.0)
Platelets: 181 10*3/uL (ref 150.0–400.0)
RBC: 4.44 Mil/uL (ref 4.22–5.81)
RDW: 13.9 % (ref 11.5–15.5)
WBC: 4.5 10*3/uL (ref 4.0–10.5)

## 2015-05-01 NOTE — Progress Notes (Signed)
Pre visit review using our clinic review tool, if applicable. No additional management support is needed unless otherwise documented below in the visit note. 

## 2015-05-01 NOTE — Patient Instructions (Signed)
Limit your sodium (Salt) intake  Please check your blood pressure on a regular basis.  If it is consistently greater than 150/90, please make an office appointment.    It is important that you exercise regularly, at least 20 minutes 3 to 4 times per week.  If you develop chest pain or shortness of breath seek  medical attention.  Return in 6 months for follow-up  

## 2015-05-01 NOTE — Progress Notes (Signed)
Subjective:    Patient ID: Jonathon [name], male    DOB: 08-Oct-1935, 79 y.o.   MRN: [id number]  HPI  79 year old patient who is seen today in follow-up.  He has a history of a polyradiculoneuropathy.  This has been quite stable.  He no longer uses a walker and is active with yard work. He has treated hypertension. Medicines reviewed He has a history of elevated LFTs.  He was told 3 months ago to abstain from all alcohol..  He does admit to excessive alcohol use and states that he has cut back considerably but still drinking.  Feels well  BP Readings from Last 3 Encounters:  05/01/15 152/90  01/30/15 146/90  12/05/14 132/84    Wt Readings from Last 3 Encounters:  05/01/15 200 lb (90.719 kg)  01/30/15 181 lb (82.101 kg)  12/05/14 175 lb 12.8 oz (79.742 kg)    Past Medical History  Diagnosis Date  . BENIGN PROSTATIC HYPERTROPHY, HX OF 10/25/2008  . BRADYCARDIA 2005  . HYPERTENSION 11/21/2006  . 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 PACEMAKER, PERMANENT 2005    Gen change 2014 Medtronic Adaptic L dual-chamber pacemaker, serial [id number]   . TOBACCO ABUSE 10/25/2008    Quit 2012  . Iron deficiency anemia, unspecified 04/19/2013  . Asthma     Social History   Social History  . Marital Status: Divorced    Spouse Name: N/A  . Number of Children: 4  . Years of Education: N/A   Occupational History  . ENVIROMENTAL SERVICE    Social History Main Topics  . Smoking status: Former Smoker -- 0.25 packs/day for 46 years    Types: Cigarettes    Start date: 03/16/1965    Quit date: 06/15/2010  . Smokeless tobacco: Never Used     Comment: quit 3 years ago  . Alcohol Use: 0.0 oz/week    0 Standard drinks or equivalent per week     Comment: 4 beers per day  . Drug Use: No  . Sexual Activity: Not on file   Other Topics Concern  . Not on file   Social History Narrative   Has relocated from [place] in 2007. 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    Past Surgical History  Procedure Laterality Date  . Pacemaker insertion  2005  . Colonoscopy  [id number]  . Skin graft Right 1962    wrist  . Permanent pacemaker generator change N/A 06/19/2012    Procedure: PERMANENT PACEMAKER GENERATOR CHANGE;  Surgeon: [name], MD; Medtronic Adaptic L dual-chamber pacemaker, serial [id number]      Family History  Problem Relation Age of Onset  . Breast cancer Sister     Living, 53  . Diabetes type II Brother     Living, 93  . Breast cancer Sister     Living, 69  . Heart attack Father     Died, 13  . Kidney disease Father   . Parkinson's disease Mother     Died, 41  . Diabetes Daughter     Living, 41  . Diabetes Son     Living, 31  . Colon cancer Neg Hx   . Rectal cancer Neg Hx   . Stomach cancer Neg Hx   . Ovarian cancer Daughter     No Known Allergies  Current Outpatient Prescriptions on File Prior to Visit  Medication Sig Dispense Refill  . Cyanocobalamin (VITAMIN B 12 PO) Take 1,000  mg by mouth daily.    . flecainide (TAMBOCOR) 150 MG tablet TAKE 1/2 TABLET BY MOUTH TWICE DAILY 30 tablet 6  . Garlic 123XX123 MG CAPS Take 2,000 mg by mouth as needed.     . Omega-3 Fatty Acids (FISH OIL) 1000 MG CAPS Take 1 capsule by mouth every morning.     . tamsulosin (FLOMAX) 0.4 MG CAPS capsule TAKE ONE CAPSULE BY MOUTH EVERY DAY 90 capsule 1   Current Facility-Administered Medications on File Prior to Visit  Medication Dose Route Frequency Provider Last Rate Last Dose  . Immune Globulin 10% (OCTAGAM) IV infusion 85 g  1 g/kg Intravenous Q30 days Donika K Patel, DO      . methylPREDNISolone sodium succinate (SOLU-MEDROL) injection 1,000 mg  1,000 mg Intravenous Once Donika K Patel, DO      . methylPREDNISolone sodium succinate (SOLU-MEDROL) injection 1,000 mg  1,000 mg Intravenous Once Donika K Patel, DO      . methylPREDNISolone sodium succinate (SOLU-MEDROL) injection 1,000 mg  1,000 mg Intravenous Once Donika K Patel, DO        . methylPREDNISolone sodium succinate (SOLU-MEDROL) injection 1,000 mg  1,000 mg Intravenous Once Donika K Patel, DO        BP 152/90 mmHg  Pulse 87  Temp(Src) 98.5 F (36.9 C) (Oral)  Resp 20  Ht 5\' 10"  (D34-534 m)  Wt 200 lb (90.719 kg)  BMI 28.70 kg/m2  SpO2 99%       Review of Systems  Constitutional: Negative for fever, chills, appetite change and fatigue.  HENT: Negative for congestion, dental problem, ear pain, hearing loss, sore throat, tinnitus, trouble swallowing and voice change.   Eyes: Negative for pain, discharge and visual disturbance.  Respiratory: Negative for cough, chest tightness, wheezing and stridor.   Cardiovascular: Positive for leg swelling. Negative for chest pain and palpitations.  Gastrointestinal: Negative for nausea, vomiting, abdominal pain, diarrhea, constipation, blood in stool and abdominal distention.  Genitourinary: Negative for urgency, hematuria, flank pain, discharge, difficulty urinating and genital sores.  Musculoskeletal: Negative for myalgias, back pain, joint swelling, arthralgias, gait problem and neck stiffness.  Skin: Negative for rash.  Neurological: Negative for dizziness, syncope, speech difficulty, weakness, numbness and headaches.  Hematological: Negative for adenopathy. Does not bruise/bleed easily.  Psychiatric/Behavioral: Negative for behavioral problems and dysphoric mood. The patient is not nervous/anxious.        Objective:   Physical Exam  Constitutional: He is oriented to person, place, and time. He appears well-developed.  HENT:  Head: Normocephalic.  Right Ear: External ear normal.  Left Ear: External ear normal.  Eyes: Conjunctivae and EOM are normal.  Neck: Normal range of motion.  Cardiovascular: Normal rate and normal heart sounds.   Pulmonary/Chest: Breath sounds normal.  Abdominal: Bowel sounds are normal.  Musculoskeletal: Normal range of motion. He exhibits edema. He exhibits no tenderness.   Neurological: He is alert and oriented to person, place, and time.  Psychiatric: He has a normal mood and affect. His behavior is normal.          Assessment & Plan:   Hypertension.  Fair control.  Hopeful to improve with improved volume status .  Elevated LFTs.  Will repeat.  Likely related to excessive alcohol use History of anemia, improved Polyradiculoneuropathy improved  Weight gain.  Largely due to better diet and caloric intake but some peripheral edema

## 2015-05-03 DIAGNOSIS — M1711 Unilateral primary osteoarthritis, right knee: Secondary | ICD-10-CM | POA: Diagnosis not present

## 2015-06-05 ENCOUNTER — Encounter: Payer: Medicare Other | Admitting: *Deleted

## 2015-06-07 ENCOUNTER — Encounter: Payer: Self-pay | Admitting: Cardiology

## 2015-06-12 ENCOUNTER — Ambulatory Visit (INDEPENDENT_AMBULATORY_CARE_PROVIDER_SITE_OTHER): Payer: Medicare Other | Admitting: *Deleted

## 2015-06-12 DIAGNOSIS — I495 Sick sinus syndrome: Secondary | ICD-10-CM | POA: Diagnosis not present

## 2015-06-13 NOTE — Progress Notes (Signed)
Remote pacemaker transmission.   

## 2015-06-20 LAB — CUP PACEART REMOTE DEVICE CHECK
Battery Remaining Longevity: 134 mo
Battery Voltage: 2.78 V
Brady Statistic AP VP Percent: 0 %
Date Time Interrogation Session: 20170128175830
Implantable Lead Implant Date: 20050121
Implantable Lead Location: 753859
Implantable Lead Model: 5076
Lead Channel Impedance Value: 495 Ohm
Lead Channel Pacing Threshold Amplitude: 0.5 V
Lead Channel Pacing Threshold Pulse Width: 0.4 ms
Lead Channel Pacing Threshold Pulse Width: 0.4 ms
Lead Channel Sensing Intrinsic Amplitude: 2.8 mV
Lead Channel Setting Pacing Amplitude: 2 V
Lead Channel Setting Pacing Amplitude: 2.5 V
Lead Channel Setting Pacing Pulse Width: 0.4 ms
Lead Channel Setting Sensing Sensitivity: 2 mV
MDC IDC LEAD IMPLANT DT: 20050121
MDC IDC LEAD LOCATION: 753860
MDC IDC MSMT BATTERY IMPEDANCE: 206 Ohm
MDC IDC MSMT LEADCHNL RA PACING THRESHOLD AMPLITUDE: 0.75 V
MDC IDC MSMT LEADCHNL RV IMPEDANCE VALUE: 634 Ohm
MDC IDC MSMT LEADCHNL RV SENSING INTR AMPL: 5.6 mV
MDC IDC STAT BRADY AP VS PERCENT: 0 %
MDC IDC STAT BRADY AS VP PERCENT: 7 %
MDC IDC STAT BRADY AS VS PERCENT: 92 %

## 2015-06-23 ENCOUNTER — Encounter: Payer: Self-pay | Admitting: Cardiology

## 2015-08-22 ENCOUNTER — Encounter (HOSPITAL_COMMUNITY): Payer: Self-pay | Admitting: *Deleted

## 2015-08-22 ENCOUNTER — Emergency Department (HOSPITAL_COMMUNITY)
Admission: EM | Admit: 2015-08-22 | Discharge: 2015-08-22 | Disposition: A | Discharge status: Home / Self Care | Payer: Medicare Other | Attending: Emergency Medicine | Admitting: Emergency Medicine

## 2015-08-22 DIAGNOSIS — R748 Abnormal levels of other serum enzymes: Secondary | ICD-10-CM | POA: Diagnosis not present

## 2015-08-22 DIAGNOSIS — Z950 Presence of cardiac pacemaker: Secondary | ICD-10-CM | POA: Insufficient documentation

## 2015-08-22 DIAGNOSIS — R55 Syncope and collapse: Secondary | ICD-10-CM | POA: Diagnosis present

## 2015-08-22 DIAGNOSIS — E860 Dehydration: Secondary | ICD-10-CM | POA: Insufficient documentation

## 2015-08-22 DIAGNOSIS — Z79899 Other long term (current) drug therapy: Secondary | ICD-10-CM | POA: Insufficient documentation

## 2015-08-22 DIAGNOSIS — Z87891 Personal history of nicotine dependence: Secondary | ICD-10-CM | POA: Insufficient documentation

## 2015-08-22 DIAGNOSIS — D509 Iron deficiency anemia, unspecified: Secondary | ICD-10-CM | POA: Diagnosis not present

## 2015-08-22 DIAGNOSIS — Z791 Long term (current) use of non-steroidal anti-inflammatories (NSAID): Secondary | ICD-10-CM | POA: Insufficient documentation

## 2015-08-22 DIAGNOSIS — Z87442 Personal history of urinary calculi: Secondary | ICD-10-CM | POA: Insufficient documentation

## 2015-08-22 DIAGNOSIS — N400 Enlarged prostate without lower urinary tract symptoms: Secondary | ICD-10-CM | POA: Diagnosis not present

## 2015-08-22 DIAGNOSIS — I10 Essential (primary) hypertension: Secondary | ICD-10-CM | POA: Diagnosis not present

## 2015-08-22 DIAGNOSIS — R404 Transient alteration of awareness: Secondary | ICD-10-CM | POA: Diagnosis not present

## 2015-08-22 DIAGNOSIS — I951 Orthostatic hypotension: Secondary | ICD-10-CM

## 2015-08-22 DIAGNOSIS — R61 Generalized hyperhidrosis: Secondary | ICD-10-CM | POA: Insufficient documentation

## 2015-08-22 DIAGNOSIS — J45909 Unspecified asthma, uncomplicated: Secondary | ICD-10-CM | POA: Diagnosis not present

## 2015-08-22 DIAGNOSIS — R945 Abnormal results of liver function studies: Secondary | ICD-10-CM | POA: Diagnosis not present

## 2015-08-22 LAB — COMPREHENSIVE METABOLIC PANEL
ALT: 115 U/L — ABNORMAL HIGH (ref 17–63)
ANION GAP: 17 — AB (ref 5–15)
AST: 213 U/L — ABNORMAL HIGH (ref 15–41)
Albumin: 3.4 g/dL — ABNORMAL LOW (ref 3.5–5.0)
Alkaline Phosphatase: 87 U/L (ref 38–126)
BUN: 12 mg/dL (ref 6–20)
CHLORIDE: 99 mmol/L — AB (ref 101–111)
CO2: 22 mmol/L (ref 22–32)
Calcium: 9.4 mg/dL (ref 8.9–10.3)
Creatinine, Ser: 1.26 mg/dL — ABNORMAL HIGH (ref 0.61–1.24)
GFR calc non Af Amer: 53 mL/min — ABNORMAL LOW (ref >60)
Glucose, Bld: 141 mg/dL — ABNORMAL HIGH (ref 65–99)
Potassium: 4.5 mmol/L (ref 3.5–5.1)
SODIUM: 138 mmol/L (ref 135–145)
Total Bilirubin: 1.5 mg/dL — ABNORMAL HIGH (ref 0.3–1.2)
Total Protein: 7.2 g/dL (ref 6.5–8.1)

## 2015-08-22 LAB — I-STAT TROPONIN, ED
Troponin i, poc: 0 ng/mL (ref 0.00–0.08)
Troponin i, poc: 0 ng/mL (ref 0.00–0.08)

## 2015-08-22 LAB — CBC WITH DIFFERENTIAL/PLATELET
Basophils Absolute: 0 10*3/uL (ref 0.0–0.1)
Basophils Relative: 1 %
EOS ABS: 0 10*3/uL (ref 0.0–0.7)
EOS PCT: 0 %
HCT: 34.9 % — ABNORMAL LOW (ref 39.0–52.0)
Hemoglobin: 11.4 g/dL — ABNORMAL LOW (ref 13.0–17.0)
LYMPHS ABS: 0.7 10*3/uL (ref 0.7–4.0)
Lymphocytes Relative: 17 %
MCH: 27.3 pg (ref 26.0–34.0)
MCHC: 32.7 g/dL (ref 30.0–36.0)
MCV: 83.5 fL (ref 78.0–100.0)
MONOS PCT: 13 %
Monocytes Absolute: 0.6 10*3/uL (ref 0.1–1.0)
Neutro Abs: 3.1 10*3/uL (ref 1.7–7.7)
Neutrophils Relative %: 70 %
Platelets: 80 10*3/uL — ABNORMAL LOW (ref 150–400)
RBC: 4.18 MIL/uL — AB (ref 4.22–5.81)
RDW: 15.8 % — ABNORMAL HIGH (ref 11.5–15.5)
WBC: 4.4 10*3/uL (ref 4.0–10.5)

## 2015-08-22 MED ORDER — SODIUM CHLORIDE 0.9 % IV BOLUS (SEPSIS)
1000.0000 mL | Freq: Once | INTRAVENOUS | Status: AC
  Administered 2015-08-22: 1000 mL via INTRAVENOUS

## 2015-08-22 NOTE — ED Provider Notes (Signed)
CSN: [id number]     Arrival date & time 08/22/15  1300 History   First MD Initiated Contact with Patient 08/22/15 1306     Chief Complaint  Patient presents with  . Loss of Consciousness  . Chest Pain     (Consider location/radiation/quality/duration/timing/severity/associated sxs/prior Treatment) The history is provided by the patient.  Jonathon [name] is a 80 y.o. male hx of BPH, HTN, pacemaker for SA node dysfunction here with syncope. Patient states that he was in the sauna for about 10 minutes as he usually does in the morning. He then took a shower and then felt lightheaded and dizzy. Also felt diaphoretic at the time and then passed out. Patient denies chest pain at that time. He was sitting at that time and then slid down and had no head injury. EMS was called and patient passed out again in the EMS. Had hx of syncope and has a pacemaker. Denies chest pain. Hx of orthostatic hypotension per patient. Has been eating and drinking well, no vomiting or fevers.    Past Medical History  Diagnosis Date  . BENIGN PROSTATIC HYPERTROPHY, HX OF 10/25/2008  . BRADYCARDIA 2005  . HYPERTENSION 11/21/2006  . 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 PACEMAKER, PERMANENT 2005    Gen change 2014 Medtronic Adaptic L dual-chamber pacemaker, serial [id number]   . TOBACCO ABUSE 10/25/2008    Quit 2012  . Iron deficiency anemia, unspecified 04/19/2013  . Asthma    Past Surgical History  Procedure Laterality Date  . Pacemaker insertion  2005  . Colonoscopy  [id number]  . Skin graft Right 1962    wrist  . Permanent pacemaker generator change N/A 06/19/2012    Procedure: PERMANENT PACEMAKER GENERATOR CHANGE;  Surgeon: [name], MD; Medtronic Adaptic L dual-chamber pacemaker, serial [id number]     Family History  Problem Relation Age of Onset  . Breast cancer Sister     Living, 8  . Diabetes type II Brother     Living, 12  . Breast cancer Sister     Living, 35  .  Heart attack Father     Died, 31  . Kidney disease Father   . Parkinson's disease Mother     Died, 76  . Diabetes Daughter     Living, 96  . Diabetes Son     Living, 75  . Colon cancer Neg Hx   . Rectal cancer Neg Hx   . Stomach cancer Neg Hx   . Ovarian cancer Daughter    Social History  Substance Use Topics  . Smoking status: Former Smoker -- 0.25 packs/day for 46 years    Types: Cigarettes    Start date: 03/16/1965    Quit date: 06/15/2010  . Smokeless tobacco: Never Used     Comment: quit 3 years ago  . Alcohol Use: 0.0 oz/week    0 Standard drinks or equivalent per week     Comment: 4 beers per day    Review of Systems  Cardiovascular: Positive for syncope.  All other systems reviewed and are negative.     Allergies  Review of patient's allergies indicates no known allergies.  Home Medications   Prior to Admission medications   Medication Sig Start Date End Date Taking? Authorizing Provider  Cyanocobalamin (VITAMIN B 12 PO) Take 1,000 mg by mouth daily.   Yes Historical Provider, MD  flecainide (TAMBOCOR) 150 MG tablet TAKE 1/2 TABLET BY MOUTH TWICE DAILY 02/06/15  Yes Carleene Overlie  Peyton Najjar, MD  furosemide (LASIX) 20 MG tablet Take 20 mg by mouth as needed.   Yes Historical Provider, MD  Garlic 123XX123 MG CAPS Take 2,000 mg by mouth as needed.    Yes Historical Provider, MD  irbesartan (AVAPRO) 300 MG tablet Take 300 mg by mouth daily.   Yes Historical Provider, MD  meloxicam (MOBIC) 15 MG tablet Take 15 mg by mouth daily.  03/22/15  Yes Historical Provider, MD  Omega-3 Fatty Acids (FISH OIL) 1000 MG CAPS Take 1 capsule by mouth every morning.    Yes Historical Provider, MD  potassium chloride (K-DUR) 10 MEQ tablet Take 10 mEq by mouth as needed.   Yes Historical Provider, MD  tamsulosin (FLOMAX) 0.4 MG CAPS capsule TAKE ONE CAPSULE BY MOUTH EVERY DAY 09/01/14  Yes Marletta Lor, MD   BP 162/92 mmHg  Pulse 98  Resp 20  SpO2 100% Physical Exam  Constitutional: He  is oriented to person, place, and time.  Chronically ill, NAD   HENT:  Head: Normocephalic and atraumatic.  Mouth/Throat: Oropharynx is clear and moist.  No obvious scalp hematoma   Eyes: Conjunctivae are normal. Pupils are equal, round, and reactive to light.  Neck: Normal range of motion. Neck supple.  Cardiovascular: Normal rate, regular rhythm and normal heart sounds.   Pulmonary/Chest: Effort normal and breath sounds normal. No respiratory distress. He has no wheezes. He has no rales.  Abdominal: Soft. Bowel sounds are normal. He exhibits no distension. There is no tenderness. There is no rebound.  Musculoskeletal: Normal range of motion. He exhibits no edema or tenderness.  Neurological: He is alert and oriented to person, place, and time. No cranial nerve deficit. Coordination normal.  CN 2-12 intact, nl strength throughout   Skin: Skin is warm and dry.  Psychiatric: He has a normal mood and affect. His behavior is normal. Judgment and thought content normal.  Nursing note and vitals reviewed.   ED Course  Procedures (including critical care time) Labs Review Labs Reviewed  CBC WITH DIFFERENTIAL/PLATELET - Abnormal; Notable for the following:    RBC 4.18 (*)    Hemoglobin 11.4 (*)    HCT 34.9 (*)    RDW 15.8 (*)    Platelets 80 (*)    All other components within normal limits  COMPREHENSIVE METABOLIC PANEL - Abnormal; Notable for the following:    Chloride 99 (*)    Glucose, Bld 141 (*)    Creatinine, Ser 1.26 (*)    Albumin 3.4 (*)    AST 213 (*)    ALT 115 (*)    Total Bilirubin 1.5 (*)    GFR calc non Af Amer 53 (*)    Anion gap 17 (*)    All other components within normal limits  I-STAT TROPOININ, ED  I-STAT TROPOININ, ED    Imaging Review No results found. I have personally reviewed and evaluated these images and lab results as part of my medical decision-making.   EKG Interpretation   Date/Time:  Tuesday August 22 2015 13:16:40 EDT Ventricular Rate:   95 PR Interval:  200 QRS Duration: 163 QT Interval:  397 QTC Calculation: 499 R Axis:   121 Text Interpretation:  Sinus rhythm Right bundle branch block No  significant change since last tracing Confirmed by YAO  MD, DAVID ([id number])  on 08/22/2015 1:28:14 PM      MDM   Final diagnoses:  None    Jonathon [name] is a 80 y.o. male here  with syncope, no head trauma. No chest pain prior to it. Has hx of orthostatic hypotension. Will get orthostatics, labs. Will interrogate pacemaker.   4:52 PM Pacemaker interrogated. No events or arrhythmias. Patient eating and drinking well. Orthostatic with HR criteria (from 100-140) but BP is fine. Hx of orthostatic hypotension. Given 1 L NS bolus. Labs showed mild dehydration. LFTs slightly elevated but similar to previous. No abdominal tenderness and no abdominal pain. Delta trop neg. Told him to stay hydrated and avoid going to the sauna. Will dc home.    Wandra Arthurs, MD 08/22/15 [phone]

## 2015-08-22 NOTE — ED Notes (Signed)
PER EMS- PT had a syncopal episode x2  at the [facility]. Pt did a 10 min Sauna session which pt states it is his normal routine. Denies head trauma. Upon EMS arrival they attempted to get patient up from floor onto bench and pt had another syncopal episode. A/O x4. Pt was Diaphoretic during episode.

## 2015-08-22 NOTE — ED Notes (Signed)
EMS gave 400cc NS PTA

## 2015-08-22 NOTE — Discharge Instructions (Signed)
Stay hydrated.   Avoid going to the sauna for too long.   Hold lasix for 2 days.   Your liver function and kidney function is slightly abnormal, recheck with your doctor in a week.   See your doctor.   Return to ER if you have passing out, chest pain, shortness of breath, dizziness, lightheadedness.

## 2015-08-22 NOTE — ED Notes (Signed)
[employer] was paged due to no fax regarding the interrogation on patients pacemaker. Spoke w/ Vicky.

## 2015-08-22 NOTE — ED Notes (Signed)
[facility] was paged instead of [employer]. Spoke w/ Yvone Neu. Yvone Neu suggested to redo the interrogation again due to them not receiving the fax. Robie Ridge, Charge RN aware.

## 2015-09-05 ENCOUNTER — Ambulatory Visit (INDEPENDENT_AMBULATORY_CARE_PROVIDER_SITE_OTHER): Payer: Medicare Other | Admitting: Internal Medicine

## 2015-09-05 ENCOUNTER — Encounter: Payer: Self-pay | Admitting: Internal Medicine

## 2015-09-05 ENCOUNTER — Telehealth: Payer: Self-pay | Admitting: Internal Medicine

## 2015-09-05 VITALS — BP 160/90 | HR 100 | Temp 100.4°F | Resp 20 | Ht 70.0 in | Wt 185.0 lb

## 2015-09-05 DIAGNOSIS — G6181 Chronic inflammatory demyelinating polyneuritis: Secondary | ICD-10-CM

## 2015-09-05 DIAGNOSIS — M6281 Muscle weakness (generalized): Secondary | ICD-10-CM

## 2015-09-05 NOTE — Patient Instructions (Addendum)
Neurology follow-up this week.  We will notify you of follow-up appointment Call for any clinical change  Discontinue potassium

## 2015-09-05 NOTE — Progress Notes (Signed)
Pre visit review using our clinic review tool, if applicable. No additional management support is needed unless otherwise documented below in the visit note. 

## 2015-09-05 NOTE — Telephone Encounter (Signed)
That is fine 

## 2015-09-05 NOTE — Progress Notes (Signed)
Subjective:    Patient ID: Jonathon [name], male    DOB: [birth year], 80 y.o.   MRN: [name]  HPI  80 year old patient who has a history of CIDP since 2013.  He has been evaluated by neurology but has not been seen in about one year.  When last seen in clinic in December, he was doing quite well and was ambulatory without a walker.  He is able to do yard work and go to his health club and felt well. 3 weeks ago he had the rather abrupt change in his status.  He developed the abrupt onset of lower extremity weakness, which has been unchanged for 3 weeks.  Denies any bowel and bladder symptoms.  He does describe some numbness over the dorsal and lateral aspect of the left foot only.  The day prior he did yard work and was a motoric without difficulty.  Presently he must use a walker.  He denies any upper extremity symptoms.  Symptoms have been constant since deterioration  3 weeks ago He has been treated with IVIG and IVMP in the past. He is accompanied by his daughter today  Wt Readings from Last 3 Encounters:  09/05/15 185 lb (83.915 kg)  05/01/15 200 lb (90.719 kg)  01/30/15 181 lb (82.101 kg)   He was seen in the ED 2 weeks ago after a single episode that occurred after spending time in the sauna.  This has also occurred under similar circumstances in the past.  ED records reviewed.  Elevated liver function studies.  The patient has a history of heavy alcohol use, at least intermittently  Past Medical History  Diagnosis Date  . BENIGN PROSTATIC HYPERTROPHY, HX OF 10/25/2008  . BRADYCARDIA 2005  . HYPERTENSION 11/21/2006  . 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 PACEMAKER, PERMANENT 2005    Gen change 2014 Medtronic Adaptic L dual-chamber pacemaker, serial [id number]   . TOBACCO ABUSE 10/25/2008    Quit 2012  . Iron deficiency anemia, unspecified 04/19/2013  . Asthma      Social History   Social History  . Marital Status: Divorced    Spouse Name: N/A  .  Number of Children: 4  . Years of Education: N/A   Occupational History  . ENVIROMENTAL SERVICE    Social History Main Topics  . Smoking status: Former Smoker -- 0.25 packs/day for 46 years    Types: Cigarettes    Start date: 03/16/1965    Quit date: 06/15/2010  . Smokeless tobacco: Never Used     Comment: quit 3 years ago  . Alcohol Use: 0.0 oz/week    0 Standard drinks or equivalent per week     Comment: 4 beers per day  . Drug Use: No  . Sexual Activity: Not on file   Other Topics Concern  . 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    Past Surgical History  Procedure Laterality Date  . Pacemaker insertion  2005  . Colonoscopy  [id number]  . Skin graft Right 1962    wrist  . Permanent pacemaker generator change N/A 06/19/2012    Procedure: PERMANENT PACEMAKER GENERATOR CHANGE;  Surgeon: [name], MD; Medtronic Adaptic L dual-chamber pacemaker, serial [id number]      Family History  Problem Relation Age of Onset  . Breast cancer Sister     Living, 65  .  Diabetes type II Brother     Living, 86  . Breast cancer Sister     Living, 36  . Heart attack Father     Died, 7  . Kidney disease Father   . Parkinson's disease Mother     Died, 73  . Diabetes Daughter     Living, 49  . Diabetes Son     Living, 66  . Colon cancer Neg Hx   . Rectal cancer Neg Hx   . Stomach cancer Neg Hx   . Ovarian cancer Daughter     No Known Allergies  Current Outpatient Prescriptions on File Prior to Visit  Medication Sig Dispense Refill  . Cyanocobalamin (VITAMIN B 12 PO) Take 1,000 mg by mouth daily.    . flecainide (TAMBOCOR) 150 MG tablet TAKE 1/2 TABLET BY MOUTH TWICE DAILY 30 tablet 6  . Garlic 123XX123 MG CAPS Take 2,000 mg by mouth as needed.     . irbesartan (AVAPRO) 300 MG tablet Take 300 mg by mouth daily.    . meloxicam (MOBIC) 15 MG tablet Take 15 mg by mouth daily.   1  .  Omega-3 Fatty Acids (FISH OIL) 1000 MG CAPS Take 1 capsule by mouth every morning.     . potassium chloride (K-DUR) 10 MEQ tablet Take 10 mEq by mouth as needed.    . tamsulosin (FLOMAX) 0.4 MG CAPS capsule TAKE ONE CAPSULE BY MOUTH EVERY DAY 90 capsule 1   Current Facility-Administered Medications on File Prior to Visit  Medication Dose Route Frequency Provider Last Rate Last Dose  . Immune Globulin 10% (OCTAGAM) IV infusion 85 g  1 g/kg Intravenous Q30 days Donika K Patel, DO      . methylPREDNISolone sodium succinate (SOLU-MEDROL) injection 1,000 mg  1,000 mg Intravenous Once Donika K Patel, DO      . methylPREDNISolone sodium succinate (SOLU-MEDROL) injection 1,000 mg  1,000 mg Intravenous Once Donika K Patel, DO      . methylPREDNISolone sodium succinate (SOLU-MEDROL) injection 1,000 mg  1,000 mg Intravenous Once Donika K Patel, DO      . methylPREDNISolone sodium succinate (SOLU-MEDROL) injection 1,000 mg  1,000 mg Intravenous Once Donika K Patel, DO        BP 160/90 mmHg  Pulse 100  Temp(Src) 100.4 F (38 C) (Oral)  Resp 20  Ht 5\' 10"  (1.778 m)  Wt 185 lb (83.915 kg)  BMI 26.54 kg/m2  SpO2 98%     Review of Systems  Constitutional: Negative for fever, chills, appetite change and fatigue.  HENT: Negative for congestion, dental problem, ear pain, hearing loss, sore throat, tinnitus, trouble swallowing and voice change.   Eyes: Negative for pain, discharge and visual disturbance.  Respiratory: Negative for cough, chest tightness, wheezing and stridor.   Cardiovascular: Negative for chest pain, palpitations and leg swelling.  Gastrointestinal: Negative for nausea, vomiting, abdominal pain, diarrhea, constipation, blood in stool and abdominal distention.  Genitourinary: Negative for urgency, hematuria, flank pain, discharge, difficulty urinating and genital sores.  Musculoskeletal: Negative for myalgias, back pain, joint swelling, arthralgias, gait problem and neck stiffness.    Skin: Negative for rash.  Neurological: Positive for weakness and numbness. Negative for dizziness, syncope, speech difficulty and headaches.  Hematological: Negative for adenopathy. Does not bruise/bleed easily.  Psychiatric/Behavioral: Negative for behavioral problems and dysphoric mood. The patient is not nervous/anxious.        Objective:   Physical Exam  Constitutional: He is oriented to person, place, and time.  He appears well-developed.  HENT:  Head: Normocephalic.  Right Ear: External ear normal.  Left Ear: External ear normal.  Eyes: Conjunctivae and EOM are normal.  Neck: Normal range of motion.  Cardiovascular: Normal rate and normal heart sounds.   Pulmonary/Chest: Breath sounds normal.  Abdominal: Bowel sounds are normal.  Musculoskeletal: Normal range of motion. He exhibits no edema or tenderness.  Neurological: He is alert and oriented to person, place, and time. He has normal reflexes. No cranial nerve deficit. Coordination normal.  Upper extremity strength appeared to be fairly well intact Trace triceps reflexes Biceps reflexes absent  Patient had a difficult time standing from a sitting position Lower extremity weakness.  More prominent distally with prominent weakness in ankle dorsiflexion and plantar flexion  Psychiatric: He has a normal mood and affect. His behavior is normal.          Assessment & Plan:  Exacerbation CIDP.  Will obtain prompted neurology evaluation History of elevated LFTs.  Probably secondary to alcoholism.  Admits to at least episodic heavy drinking History recent syncope in the setting of volume repletion Status post permit pacemaker Essential hypertension.  Furosemide on hold Recent weight loss

## 2015-09-05 NOTE — Telephone Encounter (Signed)
Daughter called to ask if pt can be seen by Dr Raliegh Ip today? Pt's legs are weak again and pt not sure what is going on.  Pt prefers to see Dr Raliegh Ip who he knows him rather than see another provider or go to UC. Ok to use the 4pm?

## 2015-09-08 ENCOUNTER — Ambulatory Visit (INDEPENDENT_AMBULATORY_CARE_PROVIDER_SITE_OTHER): Payer: Medicare Other | Admitting: Neurology

## 2015-09-08 ENCOUNTER — Encounter: Payer: Self-pay | Admitting: Neurology

## 2015-09-08 VITALS — BP 130/70 | HR 60 | Ht 70.0 in | Wt 185.0 lb

## 2015-09-08 DIAGNOSIS — G6181 Chronic inflammatory demyelinating polyneuritis: Secondary | ICD-10-CM

## 2015-09-08 DIAGNOSIS — M545 Low back pain, unspecified: Secondary | ICD-10-CM

## 2015-09-08 NOTE — Progress Notes (Signed)
[facility]  Follow-up Visit   Date: 09/08/2015   Jonathon [name] MRN: [id number] DOB: May 11, 1936   Interim History: Jonathon [name] is a 80 y.o. 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  He is here with his daughter, Jonathon [name] today.    History of present illness: Patient was in his usual state of health until 2013. He has always been physical active and would go to the gym daily (walking up 10 miles/day). During the spring of 2013, he developed relatively subacute onset of left > right hamstring soreness without an inciting event. Pain initially started in the butt area and radiated down. There is associated proximal leg weakness and loss of muscle mass. Functionally, he is still able to complete all his usual activities, but has became much more cautious when climbing stairs.  In September 2014, he underwent NCS/EMG which did not show any myopathy or radiculopathy of the left lower extremity.  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  CK, aldolase, B12 was within normal limits. SPEP/UPEP shows monoclonal IgA kappa gammopathy and he underwent extensive hematologic testing, including bone marrow biopsy by Dr. Marin [name] which has been nondiagnostic.    In 2015, he continued to have progressive loss of muscle bulk over the upper arms and proximal legs. He has difficulty with climbing stairs and opening jars, and dyspnea on exertion. 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   He reports previously drinking 4 beers/day and on the weekends up to 6 beers/day.   He also reports having 15lb unintentional weight loss and has been eating little.  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  GM1 and  anti-MAG antibody is negative. There is evidence of severe multilevel spinal stenosis of the cervical spine, and milder findings in the lumbar region.  CSF studies shows increase oligoclonal bands with normal cell count, IgG index, and myelin basic proteins.  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   He completed 40-month of monthly IVMP and reports little benefit over the past few months, but no worsening.    UPDATE 07/22/2014:  Over the past month, he noticed increased weakness of his legs and has lost 11lb.  Appetite remains poor.  Last week at work, he work broke out in a sweat, became light headed, and when he stepped off the machine, he fell.  There was no loss of consciousness and he was able to get up by himself, but since then he has decided to stop working now.  He is no longer weight lifting at the gym, but does enjoy using the sauna at the [facility]  He continues to have problems with endurance and easily fatigue with walking.  UPDATE 08/23/2014:  [facility] sync[facility] spell secondary to orthostatic hypotension which occurred when he was getting ready to leave his cardiologist's office. He has since been started on mestinon and is using compression stockings. He was discharged to rehab facility where he is doing well with hopes of discharge home later this week. He has noticed new bilateral ankles swelling over the past few days, no associated pain or shortness of breath.   Recent lab  testing continues to show mild transaminitis.  He has gained 9lb!  UPDATE 09/08/2015:  Patient was last seen 1 year ago and reports being stable with respect to his neuropathy until early April 2017.  In fact, he was doing much better and was ambulating independently, back at the gym, and gained weight, too.  A few weeks ago, he was using a push mower to  cut the lawn and the following morning developed acute onset of bilateral leg weakness, describing his legs as "rubber".  He has to start using his rollator again and became too unsteady to shower at home, so started using the handicap shower at [facility].  He has suffered about 3 falls, but no significant injuries.  He has been dealing with laryngitis over the past week, but denies fever or cough.  He also complains of low back pain for which he was seeing chiropractor, but is more interested in osteopathic manipulation.   Medications:  Current Outpatient Prescriptions on File Prior to Visit  Medication Sig Dispense Refill  . Cyanocobalamin (VITAMIN B 12 PO) Take 1,000 mg by mouth daily.    . flecainide (TAMBOCOR) 150 MG tablet TAKE 1/2 TABLET BY MOUTH TWICE DAILY 30 tablet 6  . Garlic 9147 MG CAPS Take 2,000 mg by mouth as needed.     . irbesartan (AVAPRO) 300 MG tablet Take 300 mg by mouth daily.    . meloxicam (MOBIC) 15 MG tablet Take 15 mg by mouth daily.   1  . Omega-3 Fatty Acids (FISH OIL) 1000 MG CAPS Take 1 capsule by mouth every morning.     . potassium chloride (K-DUR) 10 MEQ tablet Take 10 mEq by mouth as needed.    . tamsulosin (FLOMAX) 0.4 MG CAPS capsule TAKE ONE CAPSULE BY MOUTH EVERY DAY 90 capsule 1   Current Facility-Administered Medications on File Prior to Visit  Medication Dose Route Frequency Provider Last Rate Last Dose  . Immune Globulin 10% (OCTAGAM) IV infusion 85 g  1 g/kg Intravenous Q30 days Jonathon [name] K Jonathon Anding, DO      . methylPREDNISolone sodium succinate (SOLU-MEDROL) injection 1,000 mg  1,000 mg Intravenous Once Jonathon [name] K Jonathon Wanninger, DO      . methylPREDNISolone sodium succinate (SOLU-MEDROL) injection 1,000 mg  1,000 mg Intravenous Once Jonathon [name] K Jonathon Aden, DO      . methylPREDNISolone sodium succinate (SOLU-MEDROL) injection 1,000 mg  1,000 mg Intravenous Once Jonathon [name] K Jonathon Dacy, DO      . methylPREDNISolone sodium succinate (SOLU-MEDROL) injection 1,000 mg  1,000 mg Intravenous  Once Jonathon [name] K Mariaha Ellington, DO        Allergies: No Known Allergies   Review of Systems:  CONSTITUTIONAL: No fevers, chills, night sweats EYES: No visual changes or eye pain ENT: No hearing changes.  No history of nose bleeds.   RESPIRATORY: No cough, wheezing and shortness of breath.   CARDIOVASCULAR: Negative for chest pain, and palpitations.   GI: Negative for abdominal discomfort, blood in stools or black stools.  No recent change in bowel habits.   GU:  No history of incontinence.   MUSCLOSKELETAL: No history of joint pain or swelling.  No myalgias.   SKIN: Negative for lesions, rash, and itching.   HEMATOLOGY/ONCOLOGY: Negative for prolonged bleeding, bruising easily, and swollen nodes.  ENDOCRINE: Negative for cold or heat intolerance, polydipsia or goiter.   PSYCH:  +depression or anxiety symptoms.   NEURO: As Above.   Vital Signs:  BP 130/70 mmHg  Pulse 60  Ht _0  (  1.778 m)  Wt 185 lb (83.915 kg)  BMI 26.54 kg/m2  SpO2 97%  General:  Well appearing, no acute distress  Neurological Exam: MENTAL STATUS including orientation to time, place, person, recent and remote memory, attention span and concentration, language, and fund of knowledge is normal.  Speech is not dysarthric.  CRANIAL NERVES:  Pupils are round and reactive to light.  Normal conjugate, extra-ocular eye movements in all directions of gaze. No ptosis.  Face is symmetric.  Palate elevates symmetrically.  Normal shoulder shrug and head rotation.  Normal tongue strength and range of motion, no deviation or fasciculation.  MOTOR:  Bilateral moderate ADM, FDI, ABP and moderate bilateral quadriceps atrophy.  No fasciculations.  No tremor or pronator drift.  Neck flexion and extension is 5/5  Right Upper Extremity:    Left Upper Extremity:    Deltoid  5/5   Deltoid  5/5   Biceps  5/5   Biceps  5/5   Triceps  5/5   Triceps  5/5   Wrist extensors  5/5   Wrist extensors  5/5   Wrist flexors  5/5   Wrist flexors  5/5    Finger extensors  5/5   Finger extensors  5/5   Finger flexors  5/5   Finger flexors  5/5   Dorsal interossei  4/5   Dorsal interossei  4/5   Abductor pollicis  4+/5   Abductor pollicis * 5-/5   Tone (Ashworth scale)  0  Tone (Ashworth scale)  0   Right Lower Extremity:    Left Lower Extremity:    Hip flexors  4+/5   Hip flexors  4+/5   Hip extensors  5/5   Hip extensors  5/5   Knee flexors  5/5   Knee flexors  5/5   Knee extensors  5/5   Knee extensors  5/5   Dorsiflexors  5/5   Dorsiflexors  5/5   Plantarflexors  5/5   Plantarflexors  5/5   Toe extensors  5/5   Toe extensors  5/5   Toe flexors  5/5   Toe flexors  5/5   Tone (Ashworth scale)  0  Tone (Ashworth scale)  0   MSRs:  Reflexes are 2+/4 in upper extremities, 1+ patella, and absent at Achilles.  Down-going plantar responses.  SENSORY: Vibration and temperature reduced distal to left ankle.   COORDINATION/GAIT:  Intact rapid alternating movements bilaterally.  Able to rise from a chair without using arms.  Gait narrow based and stable, slow.  Data: Labs 01/13/2013:  CK 55, aldolase 7.9, TSH 0.39, copper 97, ceruloplasmin 30,  Labs 07/2013:  vitamin  B12 519, ANA neg, RF neg, ESR 5 Labs 08/10/2013:  CK 91, aldolase 14.9*, GM1 antibody - neg Labs 11/09/2013:  VEGF 118* (normal 31-86) Labs 06/02/2014:  VEGF <31 Labs 08/23/2014:  Vitamin B12 643, folate 18.6, CK 72  CSF 09/24/2013:  R1 W0 G59 Protein 42, IgG index 4.4, MBP < 2.0, WNV - neg, cytology - neg, OCB - 3 well defined bands present in CSF and serum, more prominent in CSF  EMG 01/25/2013:   1. 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  2. Left ulnar neuropathy at the elbow, moderately severe in degree electrically. A superimposed C8 motor radiculopathy cannot be excluded. 3. No evidence of diffuse motor axon loss or a generalized myopathy.  EMG 09/16/2013:  There is  electrophysiological  evidence of a chronic generalized sensorimotor polyradiculoneuropathy, axonal loss and demyelinating in type, affecting the left side. When compared to his previous EMG dated 01/25/2013, there is an interval worsening of findings.  CT cervical spine and lumbar spine 08/17/2013: 1. Advanced degenerative changes in the cervical spine, including multilevel bulky anterior endplate osteophytes.  2. Suspect multifactorial moderate to severe cervical spinal stenosis at the C5-C6 and C6-C7 levels. As the patient is not a  candidate for MRI, cervical spine CT myelogram would confirm.  3. Associated severe multifactorial neural foraminal stenosis at the right C4, right C5, right C6, bilateral C7, and bilateral C8 nerve levels.  4. Mild for age lumbar spine degenerative changes, chiefly facet arthropathy. Up to mild multifactorial spinal stenosis at L2-L3 and L3-L4.    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  He underwent CSF testing which showed normal cell count and protein. 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  Labs showed monoclonal IgA kappa gammopathy on serum for which he underwent extensive evaluation by Dr. Marin [name], including bone marrow biopsy which was negative.  He also has transaminatis likely due to fatty liver.  The possibility of POEMS was raised given elevated VEGF, but repeat VEGF from 05/2014 returned normal.  Patient was started on solumedrol in June 2015 and had monthly IVMP until April 2016, without marked improvement.  Despite stopping treatment, he reports  doing very well and was walking independently again however earlier this month started experiencing a relapse.  He continues to have distal hand and and proximal leg weakness. His persistent hand weakness persists and may also be due to superimposed cervical foraminal stenosis at this level.    PLAN: Start prior authorization for IVIG 59m/kg over 2-5 days, followed by monthly 181mkg.  Risks and benefits discussed Continue to use 4-wheeled rollator as needed for long distances Start out-patient physical therapy Referral to Sports Medicine for low back pain as patient is requesting osteopathic manipulation therapy  Return to clinic in 3-74-month   The duration of this appointment visit was 45 minutes of face-to-face time with the patient.  Greater than 50% of this time was spent in counseling, explanation of diagnosis, planning of further management, and coordination of care.   Thank you for allowing me to participate in patient's care.  If I can answer any additional questions, I would be pleased to do so.    Sincerely,    Alpheus Stiff K. PatPosey ProntoO

## 2015-09-08 NOTE — Patient Instructions (Signed)
Start prior authorization for IVIG  Continue to use 4-wheeled rollator  Start out-patient physical therapy for leg weakness  Referral to Sports Medicine for low back pain as patient is requesting osteopathic manipulation therapy  Return to clinic in 2-3 months

## 2015-09-11 ENCOUNTER — Telehealth: Payer: Self-pay | Admitting: Cardiology

## 2015-09-11 ENCOUNTER — Ambulatory Visit (INDEPENDENT_AMBULATORY_CARE_PROVIDER_SITE_OTHER): Payer: Medicare Other | Admitting: *Deleted

## 2015-09-11 DIAGNOSIS — I495 Sick sinus syndrome: Secondary | ICD-10-CM | POA: Diagnosis not present

## 2015-09-11 NOTE — Telephone Encounter (Signed)
LMOVM reminding pt to send remote transmission.   

## 2015-09-12 NOTE — Progress Notes (Signed)
Remote pacemaker transmission.   

## 2015-09-14 ENCOUNTER — Other Ambulatory Visit: Payer: Self-pay | Admitting: *Deleted

## 2015-09-14 ENCOUNTER — Telehealth: Payer: Self-pay | Admitting: Neurology

## 2015-09-14 DIAGNOSIS — G6181 Chronic inflammatory demyelinating polyneuritis: Secondary | ICD-10-CM

## 2015-09-14 DIAGNOSIS — M545 Low back pain, unspecified: Secondary | ICD-10-CM

## 2015-09-14 DIAGNOSIS — R29898 Other symptoms and signs involving the musculoskeletal system: Secondary | ICD-10-CM

## 2015-09-14 DIAGNOSIS — D472 Monoclonal gammopathy: Secondary | ICD-10-CM

## 2015-09-14 DIAGNOSIS — G610 Guillain-Barre syndrome: Secondary | ICD-10-CM

## 2015-09-14 NOTE — Telephone Encounter (Signed)
PT called in regards to having home health care/[facility] [phone]

## 2015-09-14 NOTE — Telephone Encounter (Signed)
Another referral sent to [facility].  Patient aware that I am working on this.

## 2015-09-14 NOTE — Telephone Encounter (Signed)
Left message for patient to call me back. 

## 2015-09-19 ENCOUNTER — Other Ambulatory Visit: Payer: Self-pay | Admitting: Internal Medicine

## 2015-09-21 ENCOUNTER — Ambulatory Visit (INDEPENDENT_AMBULATORY_CARE_PROVIDER_SITE_OTHER): Payer: Medicare Other | Admitting: Family Medicine

## 2015-09-21 ENCOUNTER — Telehealth: Payer: Self-pay | Admitting: *Deleted

## 2015-09-21 ENCOUNTER — Encounter: Payer: Self-pay | Admitting: Family Medicine

## 2015-09-21 VITALS — BP 128/80 | HR 101 | Ht 70.0 in | Wt 180.0 lb

## 2015-09-21 DIAGNOSIS — G6181 Chronic inflammatory demyelinating polyneuritis: Secondary | ICD-10-CM | POA: Diagnosis not present

## 2015-09-21 DIAGNOSIS — M6281 Muscle weakness (generalized): Secondary | ICD-10-CM | POA: Diagnosis not present

## 2015-09-21 MED ORDER — VITAMIN D (ERGOCALCIFEROL) 1.25 MG (50000 UNIT) PO CAPS: 50000.0000 [IU] | ORAL_CAPSULE | ORAL | Status: DC

## 2015-09-21 MED ORDER — GABAPENTIN 100 MG PO CAPS: 200.0000 mg | ORAL_CAPSULE | Freq: Every day | ORAL | Status: DC

## 2015-09-21 NOTE — Telephone Encounter (Signed)
Noted.  Does he want to try out-patient PT or wait until after starting IVIG?    Hollan Philipp K. Posey Pronto, DO

## 2015-09-21 NOTE — Patient Instructions (Signed)
Good to see you  You are in great hands with Dr. Posey Pronto. For the nerves lets try gabapentin 100mg  at night for next 3 nights then 200mg  thereafter. Once weekly vitamin D for next 12 weeks.  B6 200mg  daily with your B12 can be helpful.  Add protein powder 2 times a day in ADDITION to your regular meals.  See me again in 4 weeks to see how you are responding.

## 2015-09-21 NOTE — Assessment & Plan Note (Signed)
Unfortunately I believe the patient's muscle wasting and muscle weakness is secondary to the chronic inflammatory demyelinating polyneuropathy. I do not believe that this is coming from his back at this moment. We did discuss with patient about repeat imaging which patient declined at the moment. We discussed icing regimen. Patient has had difficulty with iron deficiency previously and we did discuss potential supplementation. Patient will be started on gabapentin which I am hoping we'll help with some of the neuropathy that he is having. I am on a very low dose. Patient will also be started on Solu-Medrol and sounds like in the near future. We discussed vitamin D supplementation. Patient will do this and we'll watch for any signs of kidney stones. I do not believe that he would respond great to osteopathic manipulation today. We will consider this and follow-up. Discussed Perkins supplementation to help as well. Encourage him to remain active. Follow-up again in 4 weeks.

## 2015-09-21 NOTE — Progress Notes (Signed)
[name] [address] [place], [place] [address] Phone: [phone] Subjective:    I'm seeing this patient by the request  of:  Patel MD.   CC: Bilateral leg weakness and mild low back pain  [id number] Jonathon [name] is a 80 y.o. male coming in with complaint of bilateral leg weakness. Patient over the course of time has had generalized weakness as well as management muscle atrophy of his extremities. Patient is notices severely in his legs. Patient has been followed by neurology at this moment. 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 Patient has had this generalized weakness started in 2013 and since then has started to have worsening symptoms. Seems to be worse on the left side at this moment. Does have radiation going down the lateral aspect of the leg. Doing B12 with no significant improvement yet. Patient is going to be on a treatment for his neurologic condition again soon he states. Patient did respond to immunoglobulin therapy previously. Open that this will be beneficial given. Looking at there is any other adjuvant therapies a could be beneficial. Patient states things that have also changed over the course of time has been his diet. Patient is not as hungry. Has lost approximately 15 pounds over the course last year. Patient recently states that he was using a lawnmower and bilateral leg weakness occurred. Started having 10 of a rubber feeling in his legs. Since then has had kind of an unsteady gait. Mild low back pain with it but very minimal. Only numbness is on the lateral aspect of the left leg. Patient has seen a chiropractor for it previously with very minimal improvement. Wondering what else can be done.   previous imaging including CT of the lumbar spine in 2015 showing very mild degenerative changes.  Past Medical History  Diagnosis Date  . BENIGN PROSTATIC HYPERTROPHY, HX OF 10/25/2008  .  BRADYCARDIA 2005  . HYPERTENSION 11/21/2006  . 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 PACEMAKER, PERMANENT 2005    Gen change 2014 Medtronic Adaptic L dual-chamber pacemaker, serial [id number]   . TOBACCO ABUSE 10/25/2008    Quit 2012  . Iron deficiency anemia, unspecified 04/19/2013  . Asthma    Past Surgical History  Procedure Laterality Date  . Pacemaker insertion  2005  . Colonoscopy  [id number]  . Skin graft Right 1962    wrist  . Permanent pacemaker generator change N/A 06/19/2012    Procedure: PERMANENT PACEMAKER GENERATOR CHANGE;  Surgeon: [name], MD; Medtronic Adaptic L dual-chamber pacemaker, serial [id number]     Social History   Social History  . Marital Status: Divorced    Spouse Name: N/A  . Number of Children: 4  . Years of Education: N/A   Occupational History  . ENVIROMENTAL SERVICE    Social History Main Topics  . Smoking status: Former Smoker -- 0.25 packs/day for 46 years    Types: Cigarettes    Start date: 03/16/1965    Quit date: 06/15/2010  . Smokeless tobacco: Never Used     Comment: quit 3 years ago  . Alcohol Use: 0.0 oz/week    0 Standard drinks or equivalent per week     Comment: 4 beers per day  . Drug Use: No  . Sexual Activity: Not Asked   Other Topics Concern  . None   Social History Narrative   Has relocated from  [place] in 2007. 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   No Known Allergies Family History  Problem Relation Age of Onset  . Breast cancer Sister     Living, 49  . Diabetes type II Brother     Living, 83  . Breast cancer Sister     Living, 22  . Heart attack Father     Died, 51  . Kidney disease Father   . Parkinson's disease Mother     Died, 24  . Diabetes Daughter     Living, 70  . Diabetes Son     Living, 78  . Colon cancer Neg Hx   . Rectal cancer Neg Hx   . Stomach cancer Neg Hx   . Ovarian cancer Daughter     Past medical history, social, surgical and  family history all reviewed in electronic medical record.  No pertanent information unless stated regarding to the chief complaint.   Review of Systems: No headache, visual changes, nausea, vomiting, diarrhea, constipation, dizziness, abdominal pain, skin rash, fevers, chills, night sweats, weight loss, swollen lymph nodes, body aches, joint swelling, muscle aches, chest pain, shortness of breath, mood changes.   Objective Blood pressure 128/80, pulse 101, height 5\' 10"  (1.778 m), weight 180 lb (81.647 kg), SpO2 95 %.  General: No apparent distress alert and oriented x3 mood and affect normal, dressed appropriately.  HEENT: Pupils equal, extraocular movements intact  Respiratory: Patient's speak in full sentences and does not appear short of breath  Cardiovascular: No lower extremity edema, non tender, no erythema  Skin: Warm dry intact with no signs of infection or rash on extremities or on axial skeleton.  Abdomen: Soft nontender  Neuro: Cranial nerves II through XII are intact, neurovascularly intact in all extremities with 2+ DTRs and 2+ pulses.  Lymph: No lymphadenopathy of posterior or anterior cervical chain or axillae bilaterally.  Gait normal with good balance and coordination.  MSK:  Non tender with full range of motion and good stability and symmetric strength and tone of shoulders, elbows, wrist, hip, knee and ankles bilaterally. Significant atrophy of all extremity muscles. Patient does have thenar eminence wasting. Atrophy of the quadriceps bilaterally. Appears to be neurovascularly intact distally with 5 out of 5 strength of the lower extremities. Deep tendon reflexes are 2+ and symmetric. Back exam shows the patient has near full range of motion lacking the last 5 of extension. Negative straight leg test bilaterally. Nontender on exam today over the paraspinal musculature and the spinous process.   Impression and Recommendations:     This case required medical decision making  of moderate complexity.      Note: This dictation was prepared with Dragon dictation along with smaller phrase technology. Any transcriptional errors that result from this process are unintentional.

## 2015-09-21 NOTE — Progress Notes (Signed)
Pre visit review using our clinic review tool, if applicable. No additional management support is needed unless otherwise documented below in the visit note. 

## 2015-09-21 NOTE — Telephone Encounter (Signed)
Brad from [facility] called to let us know that patient does not qualify for home health PT.

## 2015-09-22 NOTE — Telephone Encounter (Signed)
Left a message requesting for patient to call me if he would like to do PT outside of the home.

## 2015-09-25 ENCOUNTER — Telehealth: Payer: Self-pay | Admitting: Neurology

## 2015-09-25 NOTE — Telephone Encounter (Signed)
Patient agreed to try outpatient PT since he does not qualify for in home PT.   Referral sent.

## 2015-09-25 NOTE — Telephone Encounter (Signed)
Jonathon [name] 2035-09-27 Patient was returning your call regarding setting up an appointment for rehab. His number is [phone]. Thank you

## 2015-10-02 DIAGNOSIS — R531 Weakness: Secondary | ICD-10-CM | POA: Diagnosis not present

## 2015-10-04 DIAGNOSIS — R531 Weakness: Secondary | ICD-10-CM | POA: Diagnosis not present

## 2015-10-13 DIAGNOSIS — R531 Weakness: Secondary | ICD-10-CM | POA: Diagnosis not present

## 2015-10-19 ENCOUNTER — Encounter (HOSPITAL_COMMUNITY): Payer: Self-pay

## 2015-10-19 ENCOUNTER — Emergency Department (HOSPITAL_COMMUNITY)
Admission: EM | Admit: 2015-10-19 | Discharge: 2015-10-20 | Disposition: A | Discharge status: Home / Self Care | Payer: Medicare Other | Attending: Emergency Medicine | Admitting: Emergency Medicine

## 2015-10-19 DIAGNOSIS — Z79899 Other long term (current) drug therapy: Secondary | ICD-10-CM | POA: Insufficient documentation

## 2015-10-19 DIAGNOSIS — J45909 Unspecified asthma, uncomplicated: Secondary | ICD-10-CM | POA: Insufficient documentation

## 2015-10-19 DIAGNOSIS — Z87891 Personal history of nicotine dependence: Secondary | ICD-10-CM | POA: Diagnosis not present

## 2015-10-19 DIAGNOSIS — Z87442 Personal history of urinary calculi: Secondary | ICD-10-CM | POA: Diagnosis not present

## 2015-10-19 DIAGNOSIS — Z862 Personal history of diseases of the blood and blood-forming organs and certain disorders involving the immune mechanism: Secondary | ICD-10-CM | POA: Insufficient documentation

## 2015-10-19 DIAGNOSIS — Z950 Presence of cardiac pacemaker: Secondary | ICD-10-CM | POA: Diagnosis not present

## 2015-10-19 DIAGNOSIS — R55 Syncope and collapse: Secondary | ICD-10-CM | POA: Insufficient documentation

## 2015-10-19 DIAGNOSIS — R42 Dizziness and giddiness: Secondary | ICD-10-CM | POA: Diagnosis not present

## 2015-10-19 DIAGNOSIS — N400 Enlarged prostate without lower urinary tract symptoms: Secondary | ICD-10-CM | POA: Diagnosis not present

## 2015-10-19 DIAGNOSIS — E860 Dehydration: Secondary | ICD-10-CM | POA: Insufficient documentation

## 2015-10-19 DIAGNOSIS — R402431 Glasgow coma scale score 3-8, in the field [EMT or ambulance]: Secondary | ICD-10-CM | POA: Diagnosis not present

## 2015-10-19 DIAGNOSIS — I10 Essential (primary) hypertension: Secondary | ICD-10-CM | POA: Diagnosis not present

## 2015-10-19 LAB — BASIC METABOLIC PANEL
ANION GAP: 16 — AB (ref 5–15)
BUN: 8 mg/dL (ref 6–20)
CO2: 21 mmol/L — ABNORMAL LOW (ref 22–32)
Calcium: 9.1 mg/dL (ref 8.9–10.3)
Chloride: 95 mmol/L — ABNORMAL LOW (ref 101–111)
Creatinine, Ser: 1.32 mg/dL — ABNORMAL HIGH (ref 0.61–1.24)
GFR calc Af Amer: 58 mL/min — ABNORMAL LOW (ref >60)
GFR, EST NON AFRICAN AMERICAN: 50 mL/min — AB (ref >60)
GLUCOSE: 161 mg/dL — AB (ref 65–99)
POTASSIUM: 3.5 mmol/L (ref 3.5–5.1)
Sodium: 132 mmol/L — ABNORMAL LOW (ref 135–145)

## 2015-10-19 LAB — CBC
HEMATOCRIT: 34.3 % — AB (ref 39.0–52.0)
HEMOGLOBIN: 11.7 g/dL — AB (ref 13.0–17.0)
MCH: 27.9 pg (ref 26.0–34.0)
MCHC: 34.1 g/dL (ref 30.0–36.0)
MCV: 81.9 fL (ref 78.0–100.0)
Platelets: 58 10*3/uL — ABNORMAL LOW (ref 150–400)
RBC: 4.19 MIL/uL — ABNORMAL LOW (ref 4.22–5.81)
RDW: 13.6 % (ref 11.5–15.5)
WBC: 3.4 10*3/uL — AB (ref 4.0–10.5)

## 2015-10-19 LAB — URINALYSIS, ROUTINE W REFLEX MICROSCOPIC
Glucose, UA: NEGATIVE mg/dL
Hgb urine dipstick: NEGATIVE
Ketones, ur: 15 mg/dL — AB
LEUKOCYTES UA: NEGATIVE
NITRITE: NEGATIVE
PH: 6.5 (ref 5.0–8.0)
Protein, ur: 30 mg/dL — AB
SPECIFIC GRAVITY, URINE: 1.014 (ref 1.005–1.030)

## 2015-10-19 LAB — URINE MICROSCOPIC-ADD ON

## 2015-10-19 LAB — CBG MONITORING, ED: Glucose-Capillary: 137 mg/dL — ABNORMAL HIGH (ref 65–99)

## 2015-10-19 MED ORDER — SODIUM CHLORIDE 0.9 % IV SOLN
Freq: Once | INTRAVENOUS | Status: AC
  Administered 2015-10-19: 17:00:00 via INTRAVENOUS

## 2015-10-19 MED ORDER — SODIUM CHLORIDE 0.9 % IV SOLN
Freq: Once | INTRAVENOUS | Status: AC
  Administered 2015-10-19: 15:00:00 via INTRAVENOUS

## 2015-10-19 NOTE — ED Notes (Signed)
Pacemaker interrogated. 

## 2015-10-19 NOTE — ED Notes (Signed)
Pt ambulated in hall with walker without difficulty.  

## 2015-10-19 NOTE — ED Provider Notes (Signed)
Presents with syncopal event at the [facility] earlier today. As witnessed by bystanders. Patient states he's had several in the past one year. Last similar event was in April 2017. He received intravenous fluids and released was released home. He denies any headache denies abdominal pain denies chest pain presently feels well.  Orlie Dakin, MD 10/20/15 [phone]

## 2015-10-19 NOTE — ED Provider Notes (Signed)
CSN: [id number]     Arrival date & time 10/19/15  1336 History   First MD Initiated Contact with Patient 10/19/15 1414     Chief Complaint  Patient presents with  . Loss of Consciousness     (Consider location/radiation/quality/duration/timing/severity/associated sxs/prior Treatment) HPI Patient presents to the emergency department with a history of loss of consciousness.  The patient states he has taken a shower at the [facility] and when he is driving off.  He started sweating and felt hot.  The patient states that he laid himself down on the ground for fear that he is given a pass out.  The patient states had previous episodes of syncope.  He states that he has been evaluated in the emergency department several times for this.  He states he has chronic weakness of both legs.The patient denies chest pain, shortness of breath, headache,blurred vision, neck pain, fever, cough, weakness, numbness, dizziness, anorexia, edema, abdominal pain, nausea, vomiting, diarrhea, rash, back pain, dysuria, hematemesis, bloody stool. Past Medical History  Diagnosis Date  . BENIGN PROSTATIC HYPERTROPHY, HX OF 10/25/2008  . BRADYCARDIA 2005  . HYPERTENSION 11/21/2006  . 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 PACEMAKER, PERMANENT 2005    Gen change 2014 Medtronic Adaptic L dual-chamber pacemaker, serial [id number] H   . TOBACCO ABUSE 10/25/2008    Quit 2012  . Iron deficiency anemia, unspecified 04/19/2013  . Asthma    Past Surgical History  Procedure Laterality Date  . Pacemaker insertion  2005  . Colonoscopy  [id number]  . Skin graft Right 1962    wrist  . Permanent pacemaker generator change N/A 06/19/2012    Procedure: PERMANENT PACEMAKER GENERATOR CHANGE;  Surgeon: [name], MD; Medtronic Adaptic L dual-chamber pacemaker, serial [id number]     Family History  Problem Relation Age of Onset  . Breast cancer Sister     Living, 17  . Diabetes type II Brother     Living, 6   . Breast cancer Sister     Living, 70  . Heart attack Father     Died, 1  . Kidney disease Father   . Parkinson's disease Mother     Died, 100  . Diabetes Daughter     Living, 39  . Diabetes Son     Living, 71  . Colon cancer Neg Hx   . Rectal cancer Neg Hx   . Stomach cancer Neg Hx   . Ovarian cancer Daughter    Social History  Substance Use Topics  . Smoking status: Former Smoker -- 0.25 packs/day for 46 years    Types: Cigarettes    Start date: 03/16/1965    Quit date: 06/15/2010  . Smokeless tobacco: Never Used     Comment: quit 3 years ago  . Alcohol Use: 0.0 oz/week    0 Standard drinks or equivalent per week     Comment: 4 beers per day    Review of Systems  All other systems negative except as documented in the HPI. All pertinent positives and negatives as reviewed in the HPI.  Allergies  Review of patient's allergies indicates no known allergies.  Home Medications   Prior to Admission medications   Medication Sig Start Date End Date Taking? Authorizing Provider  Cyanocobalamin (VITAMIN B 12 PO) Take 1,000 mg by mouth daily.   Yes Historical Provider, MD  flecainide (TAMBOCOR) 150 MG tablet TAKE 1/2 TABLET BY MOUTH TWICE DAILY 02/06/15  Yes [name], MD  gabapentin (  NEURONTIN) 100 MG capsule Take 2 capsules (200 mg total) by mouth at bedtime. 09/21/15  Yes Lyndal Pulley, DO  Garlic [id number] MG CAPS Take 2,000 mg by mouth as needed.    Yes Historical Provider, MD  irbesartan (AVAPRO) 300 MG tablet Take 300 mg by mouth daily.   Yes Historical Provider, MD  Omega-3 Fatty Acids (FISH OIL) 1000 MG CAPS Take 1 capsule by mouth every morning.    Yes Historical Provider, MD  tamsulosin (FLOMAX) 0.4 MG CAPS capsule TAKE 1 CAPSULE BY MOUTH EVERY DAY 09/19/15  Yes Marletta Lor, MD  Vitamin D, Ergocalciferol, (DRISDOL) 50000 units CAPS capsule Take 1 capsule (50,000 Units total) by mouth every 7 (seven) days. Patient taking differently: Take 50,000 Units by mouth  every 7 (seven) days. No specific day 09/21/15  Yes Lyndal Pulley, DO   BP 154/87 mmHg  Pulse 80  Temp(Src) 98.3 F (36.8 C) (Oral)  Resp 19  Ht 5\' 10"  (1.778 m)  Wt 79.379 kg  BMI 25.11 kg/m2  SpO2 100% Physical Exam  Constitutional: He is oriented to person, place, and time. He appears well-developed and well-nourished. No distress.  HENT:  Head: Normocephalic and atraumatic.  Mouth/Throat: Oropharynx is clear and moist.  Eyes: Pupils are equal, round, and reactive to light.  Neck: Normal range of motion. Neck supple.  Cardiovascular: Normal rate, regular rhythm and normal heart sounds.  Exam reveals no gallop and no friction rub.   No murmur heard. Pulmonary/Chest: Effort normal and breath sounds normal. No respiratory distress. He has no wheezes.  Abdominal: Soft. Bowel sounds are normal. He exhibits no distension. There is no tenderness.  Musculoskeletal: He exhibits no edema.  Neurological: He is alert and oriented to person, place, and time. He exhibits normal muscle tone. Coordination normal.  Skin: Skin is warm and dry. No rash noted. No erythema.  Psychiatric: He has a normal mood and affect. His behavior is normal.  Nursing note and vitals reviewed.   ED Course  Procedures (including critical care time) Labs Review Labs Reviewed  BASIC METABOLIC PANEL - Abnormal; Notable for the following:    Sodium 132 (*)    Chloride 95 (*)    CO2 21 (*)    Glucose, Bld 161 (*)    Creatinine, Ser 1.32 (*)    GFR calc non Af Amer 50 (*)    GFR calc Af Amer 58 (*)    Anion gap 16 (*)    All other components within normal limits  CBC - Abnormal; Notable for the following:    WBC 3.4 (*)    RBC 4.19 (*)    Hemoglobin 11.7 (*)    HCT 34.3 (*)    Platelets 58 (*)    All other components within normal limits  CBG MONITORING, ED - Abnormal; Notable for the following:    Glucose-Capillary 137 (*)    All other components within normal limits  URINALYSIS, ROUTINE W REFLEX  MICROSCOPIC (NOT AT [facility])    Imaging Review No results found. I have personally reviewed and evaluated these images and lab results as part of my medical decision-making.   EKG Interpretation   Date/Time:  Thursday October 19 2015 13:43:48 EDT Ventricular Rate:  78 PR Interval:  184 QRS Duration: 150 QT Interval:  417 QTC Calculation: 475 R Axis:   73 Text Interpretation:  Sinus rhythm Right bundle branch block Confirmed by  COOK  MD, BRIAN ([id number]) on 10/19/2015 4:20:00 PM  MDM   Final diagnoses:  None   The patient is feeling better following IV fluids.  He is ambulated without difficulty.  His vital signs have improved.  The patient has had this happen previously and he states he would like to be discharged home.  Patient is advised to return here as needed     Dalia Heading, PA-C 10/20/15 [place], MD 10/20/15 1313

## 2015-10-19 NOTE — Care Management Note (Signed)
Case Management Note  Patient Details  Name: Jonathon [name] MRN: [id number] Date of Birth: 05/10/1936  Subjective/Objective:                  Pt brought in EMS from [facility] for syncopal episode. Pt reports he was taking a shower when he became dizzy. [name] alone.  Action/Plan: Follow for disposition needs. /Set up [place].   Expected Discharge Date:  10/19/15               Expected Discharge Plan:  [place]  In-House Referral:     Discharge planning Services  CM Consult  Post Acute Care Choice:  Home Health Choice offered to:  Patient  DME Arranged:    DME Agency:     HH Arranged:  RN [place] Agency:  [place]  Status of Service:  Completed, signed off  Medicare Important Message Given:    Date Medicare IM Given:    Medicare IM give by:    Date Additional Medicare IM Given:    Additional Medicare Important Message give by:     If discussed at [place] of Stay Meetings, dates discussed:    Additional Comments: [name] J. Clydene Laming, RN, BSN, [employer] [phone] Spoke with pt at bedside regarding discharge planning for [facility]. Offered pt list of home health agencies to choose from.  Pt chose [facility] to render services. Danny Lawless of [facility] notified.  No DME needs identified at this time.  Fuller Mandril, RN 10/19/2015, 3:27 PM

## 2015-10-19 NOTE — Discharge Instructions (Signed)
REturn here as needed. Follow up with your PCP.

## 2015-10-19 NOTE — ED Notes (Signed)
MD at bedside. 

## 2015-10-19 NOTE — ED Provider Notes (Signed)
Patient care assumed from [place] PA-C. See his note for further detail. Plan is for follow-up on vital signs and if patient is not symptomatic with orthostatic vitals and If patient feels well, he may be discharged home. Patient has been ambulating throughout the ED, getting up from lying position, using the bathroom without any dizziness, vision changes, chest pain, shortness of breath or other difficulty.  Has received 2 L of normal saline fluid here and feels much better. Screening labs are unremarkable-baseline for patient. Will interrogate pacemaker. Discussed with Dr. Winfred Leeds Interrogation of pacemaker shows no reason for patient's syncopal episode today. No evidence of cardiac cause of syncopal event. Likely neurocardiogenic. He remains ambulatory in the ED without difficulty. Patient has referral to home health to assist with ADLs at home. Family is at bedside and is comfortable with patient discharge. Patient appropriate for discharge. Discussed aggressive oral rehydration at home.  Filed Vitals:   10/19/15 1745 10/19/15 1830 10/19/15 1845 10/19/15 1902  BP: 151/99 168/94 169/97 165/100  Pulse: 80 89 82 83  Temp:      TempSrc:      Resp: 18   18  Height:      Weight:      SpO2: 99% 98% 99% 98%     Comer Locket, PA-C 10/19/15 [place], MD 10/20/15 1312

## 2015-10-19 NOTE — ED Notes (Signed)
Irena Cords, [place] made aware that patient would like to speak with someone here about the possibility of him getting a home health aid to help with ADL's at home.

## 2015-10-19 NOTE — ED Notes (Signed)
Pt brought in EMS from [facility] for syncopal episode.  Pt reports he was taking a shower when he became dizzy.  Pt laid himself onto floor.  Upon EMS arrival pt was A&Ox4.  EMS sat pt up and pt had LOC for aprox. 5-10 sec.  Pt then vomited and had diarrhea as well.  Pt reports a previous episode of the same symptoms in April.  Pt A&Ox4 and in NAD upon arrival.  Pt incontinent of BM and pericare completed.

## 2015-10-20 ENCOUNTER — Encounter: Payer: Self-pay | Admitting: Cardiology

## 2015-10-20 DIAGNOSIS — R531 Weakness: Secondary | ICD-10-CM | POA: Diagnosis not present

## 2015-10-22 LAB — CUP PACEART REMOTE DEVICE CHECK
Battery Impedance: 230 Ohm
Brady Statistic AP VP Percent: 0 %
Brady Statistic AP VS Percent: 0 %
Brady Statistic AS VP Percent: 12 %
Brady Statistic AS VS Percent: 87 %
Implantable Lead Implant Date: 20050121
Implantable Lead Location: 753859
Lead Channel Impedance Value: 488 Ohm
Lead Channel Pacing Threshold Amplitude: 0.5 V
Lead Channel Sensing Intrinsic Amplitude: 5.6 mV
Lead Channel Setting Pacing Amplitude: 2 V
Lead Channel Setting Pacing Amplitude: 2.5 V
Lead Channel Setting Pacing Pulse Width: 0.4 ms
MDC IDC LEAD IMPLANT DT: 20050121
MDC IDC LEAD LOCATION: 753860
MDC IDC MSMT BATTERY REMAINING LONGEVITY: 128 mo
MDC IDC MSMT BATTERY VOLTAGE: 2.78 V
MDC IDC MSMT LEADCHNL RA PACING THRESHOLD AMPLITUDE: 1 V
MDC IDC MSMT LEADCHNL RA PACING THRESHOLD PULSEWIDTH: 0.4 ms
MDC IDC MSMT LEADCHNL RA SENSING INTR AMPL: 2.8 mV
MDC IDC MSMT LEADCHNL RV IMPEDANCE VALUE: 591 Ohm
MDC IDC MSMT LEADCHNL RV PACING THRESHOLD PULSEWIDTH: 0.4 ms
MDC IDC SESS DTM: 20170501165143
MDC IDC SET LEADCHNL RV SENSING SENSITIVITY: 2 mV

## 2015-10-26 ENCOUNTER — Telehealth: Payer: Self-pay | Admitting: Internal Medicine

## 2015-10-26 NOTE — Telephone Encounter (Signed)
Jonathon [name] [name] is calling to notified md that start care for home health will start today

## 2015-10-27 ENCOUNTER — Encounter (HOSPITAL_COMMUNITY): Payer: Self-pay

## 2015-10-27 ENCOUNTER — Emergency Department (HOSPITAL_COMMUNITY)
Admission: EM | Admit: 2015-10-27 | Discharge: 2015-10-27 | Disposition: A | Discharge status: Home / Self Care | Payer: Medicare Other | Attending: Emergency Medicine | Admitting: Emergency Medicine

## 2015-10-27 DIAGNOSIS — I10 Essential (primary) hypertension: Secondary | ICD-10-CM | POA: Diagnosis not present

## 2015-10-27 DIAGNOSIS — J45909 Unspecified asthma, uncomplicated: Secondary | ICD-10-CM | POA: Insufficient documentation

## 2015-10-27 DIAGNOSIS — R55 Syncope and collapse: Secondary | ICD-10-CM | POA: Insufficient documentation

## 2015-10-27 DIAGNOSIS — Z950 Presence of cardiac pacemaker: Secondary | ICD-10-CM | POA: Diagnosis not present

## 2015-10-27 DIAGNOSIS — R531 Weakness: Secondary | ICD-10-CM | POA: Diagnosis not present

## 2015-10-27 DIAGNOSIS — Z87891 Personal history of nicotine dependence: Secondary | ICD-10-CM | POA: Insufficient documentation

## 2015-10-27 DIAGNOSIS — R404 Transient alteration of awareness: Secondary | ICD-10-CM | POA: Diagnosis not present

## 2015-10-27 LAB — URINALYSIS, ROUTINE W REFLEX MICROSCOPIC
BILIRUBIN URINE: NEGATIVE
GLUCOSE, UA: NEGATIVE mg/dL
HGB URINE DIPSTICK: NEGATIVE
Ketones, ur: NEGATIVE mg/dL
Leukocytes, UA: NEGATIVE
NITRITE: NEGATIVE
PH: 6.5 (ref 5.0–8.0)
Protein, ur: NEGATIVE mg/dL
SPECIFIC GRAVITY, URINE: 1.019 (ref 1.005–1.030)

## 2015-10-27 LAB — CBC WITH DIFFERENTIAL/PLATELET
Basophils Absolute: 0 10*3/uL (ref 0.0–0.1)
Basophils Relative: 0 %
Eosinophils Absolute: 0 10*3/uL (ref 0.0–0.7)
Eosinophils Relative: 0 %
HEMATOCRIT: 34 % — AB (ref 39.0–52.0)
HEMOGLOBIN: 11.4 g/dL — AB (ref 13.0–17.0)
LYMPHS ABS: 0.7 10*3/uL (ref 0.7–4.0)
Lymphocytes Relative: 17 %
MCH: 27.8 pg (ref 26.0–34.0)
MCHC: 33.5 g/dL (ref 30.0–36.0)
MCV: 82.9 fL (ref 78.0–100.0)
MONO ABS: 0.7 10*3/uL (ref 0.1–1.0)
MONOS PCT: 16 %
NEUTROS ABS: 2.8 10*3/uL (ref 1.7–7.7)
NEUTROS PCT: 67 %
Platelets: 171 10*3/uL (ref 150–400)
RBC: 4.1 MIL/uL — ABNORMAL LOW (ref 4.22–5.81)
RDW: 14.3 % (ref 11.5–15.5)
WBC: 4.2 10*3/uL (ref 4.0–10.5)

## 2015-10-27 LAB — BASIC METABOLIC PANEL
Anion gap: 7 (ref 5–15)
BUN: 12 mg/dL (ref 6–20)
CHLORIDE: 105 mmol/L (ref 101–111)
CO2: 24 mmol/L (ref 22–32)
CREATININE: 0.95 mg/dL (ref 0.61–1.24)
Calcium: 9 mg/dL (ref 8.9–10.3)
GFR calc Af Amer: >60 mL/min (ref >60)
GFR calc non Af Amer: >60 mL/min (ref >60)
GLUCOSE: 105 mg/dL — AB (ref 65–99)
Potassium: 4.3 mmol/L (ref 3.5–5.1)
Sodium: 136 mmol/L (ref 135–145)

## 2015-10-27 NOTE — ED Notes (Signed)
Pt presents with report of syncopal episode while at physical therapy today.  Pt reports he warmed up 10 minutes on bike, then stood up, feeling weakness.  Pt received 500 mL NS prior to arrival.

## 2015-10-27 NOTE — Discharge Instructions (Signed)

## 2015-10-27 NOTE — ED Notes (Signed)
Ambulated pt to restroom. Pt needed moderate help to restroom.

## 2015-10-27 NOTE — ED Notes (Signed)
Case management at bedside.

## 2015-10-27 NOTE — ED Notes (Signed)
Patient able to ambulate independently  

## 2015-10-27 NOTE — ED Provider Notes (Signed)
CSN: [id number]     Arrival date & time 10/27/15  1250 History   First MD Initiated Contact with Patient 10/27/15 1302     No chief complaint on file.   HPI Comments: 80 year old male presents with a syncopal episode earlier today. Past medical history significant for pacemaker placement, HTN, chronic bilateral lower extremity weakness, recurrent syncope, BPH. He was at PT today doing leg exercises when he had an acute onset of feeling flushed and lightheaded. His sister, who is in the room with him today, did witness the event. She states that he started foaming at the mouth and his eyes rolled backwards. PT staff layed the patient down on the floor and elevated his legs. She states he lost consciousness for about 1 min total. Afterwards he was transported to the ED for further evaluation. He is denying acute symptoms at this time. Denies vision changes, headache, chest pain, SOB, abdominal pain, N/V, weakness (other than chronic lower leg weakness). He has been seen for the same problem multiple times each time with a negative work up. He states this all started about 1 year ago and has happened about 5 times since. He has has had a negative seizure work up as well.    Past Medical History  Diagnosis Date  . BENIGN PROSTATIC HYPERTROPHY, HX OF 10/25/2008  . BRADYCARDIA 2005  . HYPERTENSION 11/21/2006  . 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 PACEMAKER, PERMANENT 2005    Gen change 2014 Medtronic Adaptic L dual-chamber pacemaker, serial [id number]   . TOBACCO ABUSE 10/25/2008    Quit 2012  . Iron deficiency anemia, unspecified 04/19/2013  . Asthma    Past Surgical History  Procedure Laterality Date  . Pacemaker insertion  2005  . Colonoscopy  [id number]  . Skin graft Right 1962    wrist  . Permanent pacemaker generator change N/A 06/19/2012    Procedure: PERMANENT PACEMAKER GENERATOR CHANGE;  Surgeon: [name], MD; Medtronic Adaptic L dual-chamber pacemaker,  serial [id number]     Family History  Problem Relation Age of Onset  . Breast cancer Sister     Living, 44  . Diabetes type II Brother     Living, 93  . Breast cancer Sister     Living, 56  . Heart attack Father     Died, 21  . Kidney disease Father   . Parkinson's disease Mother     Died, 57  . Diabetes Daughter     Living, 25  . Diabetes Son     Living, 72  . Colon cancer Neg Hx   . Rectal cancer Neg Hx   . Stomach cancer Neg Hx   . Ovarian cancer Daughter    Social History  Substance Use Topics  . Smoking status: Former Smoker -- 0.25 packs/day for 46 years    Types: Cigarettes    Start date: 03/16/1965    Quit date: 06/15/2010  . Smokeless tobacco: Never Used     Comment: quit 3 years ago  . Alcohol Use: 0.0 oz/week    0 Standard drinks or equivalent per week     Comment: 4 beers per day    Review of Systems  Constitutional: Negative for fever and chills.  Eyes: Negative for visual disturbance.  Respiratory: Negative for shortness of breath.   Cardiovascular: Negative for chest pain.  Gastrointestinal: Negative for abdominal pain.  Neurological: Positive for syncope. Negative for dizziness, weakness and light-headedness.    Allergies  Review  of patient's allergies indicates no known allergies.  Home Medications   Prior to Admission medications   Medication Sig Start Date End Date Taking? Authorizing Provider  Cyanocobalamin (VITAMIN B 12 PO) Take 1,000 mg by mouth daily.   Yes Historical Provider, MD  flecainide (TAMBOCOR) 150 MG tablet TAKE 1/2 TABLET BY MOUTH TWICE DAILY 02/06/15  Yes [name], MD  gabapentin (NEURONTIN) 100 MG capsule Take 2 capsules (200 mg total) by mouth at bedtime. 09/21/15  Yes Lyndal Pulley, DO  Garlic [id number] MG CAPS Take 2,000 mg by mouth daily as needed (supplement).    Yes Historical Provider, MD  irbesartan (AVAPRO) 300 MG tablet Take 300 mg by mouth daily.   Yes Historical Provider, MD  Omega-3 Fatty Acids (FISH OIL)  1000 MG CAPS Take 1 capsule by mouth every morning.    Yes Historical Provider, MD  tamsulosin (FLOMAX) 0.4 MG CAPS capsule TAKE 1 CAPSULE BY MOUTH EVERY DAY 09/19/15  Yes Marletta Lor, MD  Vitamin D, Ergocalciferol, (DRISDOL) 50000 units CAPS capsule Take 1 capsule (50,000 Units total) by mouth every 7 (seven) days. Patient taking differently: Take 50,000 Units by mouth every 7 (seven) days. No specific day 09/21/15  Yes Lyndal Pulley, DO   BP 162/98 mmHg  Pulse 83  Temp(Src) 98.6 F (37 C) (Oral)  Resp 23  Ht 5\' 10"  (1.778 m)  Wt 79.379 kg  BMI 25.11 kg/m2  SpO2 99%   Physical Exam  Constitutional: He is oriented to person, place, and time. He appears well-developed and well-nourished. No distress.  HENT:  Head: Normocephalic and atraumatic.  Eyes: Conjunctivae are normal. Pupils are equal, round, and reactive to light. Right eye exhibits no discharge. Left eye exhibits no discharge. No scleral icterus.  Neck: Normal range of motion.  Cardiovascular: Normal rate and regular rhythm.  Exam reveals no gallop and no friction rub.   No murmur heard. Pulmonary/Chest: Effort normal and breath sounds normal. No respiratory distress. He has no wheezes. He has no rales. He exhibits no tenderness.  Abdominal: Soft. Bowel sounds are normal. He exhibits no distension. There is no tenderness. There is no rebound and no guarding.  Neurological: He is alert and oriented to person, place, and time.  CN II-XII grossly intact  Skin: Skin is warm and dry.  Psychiatric: He has a normal mood and affect.    ED Course  Procedures (including critical care time) Labs Review Labs Reviewed  CBC WITH DIFFERENTIAL/PLATELET - Abnormal; Notable for the following:    RBC 4.10 (*)    Hemoglobin 11.4 (*)    HCT 34.0 (*)    All other components within normal limits  BASIC METABOLIC PANEL - Abnormal; Notable for the following:    Glucose, Bld 105 (*)    All other components within normal limits   URINALYSIS, ROUTINE W REFLEX MICROSCOPIC (NOT AT [facility])    Imaging Review No results found. I have personally reviewed and evaluated these images and lab results as part of my medical decision-making.   EKG Interpretation   Date/Time:  Friday October 27 2015 12:56:53 EDT Ventricular Rate:  79 PR Interval:  214 QRS Duration: 152 QT Interval:  412 QTC Calculation: 472 R Axis:   94 Text Interpretation:  Pacemaker spikes or artifacts Sinus rhythm  Borderline prolonged PR interval Right bundle branch block No significant  change since last tracing Confirmed by BEATON  MD, ROBERT ([id number]) on  10/27/2015 3:20:05 PM  MDM   Final diagnoses:  Syncope, unspecified syncope type   80 year old male presents with syncope. He has been worked up for this multiple times in the past with no hard findings. IVF given with significant improvement. Orthostatic vital signs are negative however patient has received fluids prior to checking this. All vital signs are within normal limits except for his blood pressure. His pressure is markedly elevated. No focal neuro deficits noted and patient feels that this is exactly the same as the other times he has had syncopal episodes. Shared visit with Dr. Audie Pinto. Stressed to patient that when he has prodromal symptoms he should find a safe place to lie flat and elevate his legs. Patient is requesting that case management is complicated so he can have PT performed at home. Consultation case management placed. Patient is NAD, non-toxic, with stable VS. Patient is informed of clinical course, understands medical decision making process, and agrees with plan. Opportunity for questions provided and all questions answered. Return precautions given.   Recardo Evangelist, PA-C 10/28/15 1409  Leonard Schwartz, MD 11/03/15 [phone]

## 2015-10-30 ENCOUNTER — Ambulatory Visit (INDEPENDENT_AMBULATORY_CARE_PROVIDER_SITE_OTHER): Payer: Medicare Other | Admitting: Internal Medicine

## 2015-10-30 ENCOUNTER — Encounter: Payer: Self-pay | Admitting: Internal Medicine

## 2015-10-30 VITALS — BP 152/80 | HR 107 | Temp 98.4°F | Resp 20 | Ht 70.0 in | Wt 177.0 lb

## 2015-10-30 DIAGNOSIS — I10 Essential (primary) hypertension: Secondary | ICD-10-CM

## 2015-10-30 DIAGNOSIS — G6181 Chronic inflammatory demyelinating polyneuritis: Secondary | ICD-10-CM

## 2015-10-30 NOTE — Care Management Note (Signed)
Case Management Note  Patient Details  Name: Jonathon [name] MRN: [id number] Date of Birth: 04-Feb-1936  Subjective/Objective:             Patient presented to [facility] ED with Syncope       Action/Plan: CM met with patient and family at bedside, permission given to discuss care in the presence of family. Patient states, he was recently in the ED for similar complaint. Patient reports [place] services were recommended however, services were never commenced. Patient and family are agreeable with recommendations for [place], PT,OT, HH. Patient has reported increase weakness and inability to leave home without assistance. Offered choice [facility] selected. Referral called in to [place] with [facility] to confirm services. Patient contact information verified. Patient verbalizes understanding teach back done. Provided patient information on Medicaid transportation. No further questions or concerns verbalized, provided my contact information should any additional questions arise. No further CM needs identified.  Expected Discharge Date:   10/27/2015               Expected Discharge Plan:  [place]  In-House Referral:     Discharge planning Services  CM Consult  Post Acute Care Choice:    Choice offered to:  Patient, Adult Children  DME Arranged:    DME Agency:     HH Arranged:  RN, PT, OT, Nurse's Aide [place] Agency:     Status of Service:  Completed, signed off  Medicare Important Message Given:    Date Medicare IM Given:    Medicare IM give by:    Date Additional Medicare IM Given:    Additional Medicare Important Message give by:     If discussed at [place] of Stay Meetings, dates discussed:    Additional CommentsLaurena Slimmer, RN 10/30/2015, 4:54 PM

## 2015-10-30 NOTE — Progress Notes (Signed)
Pre visit review using our clinic review tool, if applicable. No additional management support is needed unless otherwise documented below in the visit note. 

## 2015-10-30 NOTE — Patient Instructions (Signed)
Continue active physical therapy  Please check your blood pressure on a regular basis.  If it is consistently greater than 150/90, please make an office appointment.  Return in 6 months for follow-up  Cardiology and neurology follow-up

## 2015-10-30 NOTE — Progress Notes (Signed)
Subjective:    Patient ID: Jonathon [name], male    DOB: February 21, 1936, 80 y.o.   MRN: [id number]  HPI  80 year old patient who is seen today for his six-month follow-up. He was seen in the ED 3 days ago after an episode of recurrent syncope.  This was felt to be orthostatic and occurred in the setting of doing active physical therapy in a standing position. He has a prior history of orthostatic syncope and has been followed by cardiology due to SVT.  He is status post permanent pacemaker insertion. He has essential hypertension and remains on Avapro.  Diltiazem has been discontinued in the past. 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  He is scheduled for follow-up in August.  Remains about the same with lower extremity weakness predominating.  He uses a walker.  Continues to receive active physical therapy  ED records reviewed  Past Medical History  Diagnosis Date  . BENIGN PROSTATIC HYPERTROPHY, HX OF 10/25/2008  . BRADYCARDIA 2005  . HYPERTENSION 11/21/2006  . 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 PACEMAKER, PERMANENT 2005    Gen change 2014 Medtronic Adaptic L dual-chamber pacemaker, serial [id number]   . TOBACCO ABUSE 10/25/2008    Quit 2012  . Iron deficiency anemia, unspecified 04/19/2013  . Asthma      Social History   Social History  . Marital Status: Divorced    Spouse Name: N/A  . Number of Children: 4  . Years of Education: N/A   Occupational History  . ENVIROMENTAL SERVICE    Social History Main Topics  . Smoking status: Former Smoker -- 0.25 packs/day for 46 years    Types: Cigarettes    Start date: 03/16/1965    Quit date: 06/15/2010  . Smokeless tobacco: Never Used     Comment: quit 3 years ago  . Alcohol Use: 0.0 oz/week    0 Standard drinks or equivalent per week     Comment: 4 beers per day  . Drug Use: No  . Sexual Activity: Not on file   Other Topics Concern  . 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    Past Surgical History  Procedure Laterality Date  . Pacemaker insertion  2005  . Colonoscopy  [id number]  . Skin graft Right 1962    wrist  . Permanent pacemaker generator change N/A 06/19/2012    Procedure: PERMANENT PACEMAKER GENERATOR CHANGE;  Surgeon: [name], MD; Medtronic Adaptic L dual-chamber pacemaker, serial [id number]      Family History  Problem Relation Age of Onset  . Breast cancer Sister     Living, 24  . Diabetes type II Brother     Living, 86  . Breast cancer Sister     Living, 22  . Heart attack Father     Died, 34  . Kidney disease Father   . Parkinson's disease Mother     Died, 69  . Diabetes Daughter     Living, 65  . Diabetes Son     Living, 2  . Colon cancer Neg Hx   . Rectal cancer Neg Hx   . Stomach cancer Neg Hx   . Ovarian cancer Daughter     No Known Allergies  Current Outpatient Prescriptions on File Prior to Visit  Medication Sig Dispense Refill  . Cyanocobalamin (VITAMIN B 12  PO) Take 1,000 mg by mouth daily.    . flecainide (TAMBOCOR) 150 MG tablet TAKE 1/2 TABLET BY MOUTH TWICE DAILY 30 tablet 6  . gabapentin (NEURONTIN) 100 MG capsule Take 2 capsules (200 mg total) by mouth at bedtime. 60 capsule 3  . Garlic 123XX123 MG CAPS Take 2,000 mg by mouth daily as needed (supplement).     . irbesartan (AVAPRO) 300 MG tablet Take 300 mg by mouth daily.    . Omega-3 Fatty Acids (FISH OIL) 1000 MG CAPS Take 1 capsule by mouth every morning.     . tamsulosin (FLOMAX) 0.4 MG CAPS capsule TAKE 1 CAPSULE BY MOUTH EVERY DAY 90 capsule 3  . Vitamin D, Ergocalciferol, (DRISDOL) 50000 units CAPS capsule Take 1 capsule (50,000 Units total) by mouth every 7 (seven) days. (Patient taking differently: Take 50,000 Units by mouth every 7 (seven) days. No specific day) 12 capsule 0   Current Facility-Administered Medications on File Prior to Visit  Medication Dose  Route Frequency Provider Last Rate Last Dose  . Immune Globulin 10% (OCTAGAM) IV infusion 85 g  1 g/kg Intravenous Q30 days Donika K Patel, DO      . methylPREDNISolone sodium succinate (SOLU-MEDROL) injection 1,000 mg  1,000 mg Intravenous Once Donika K Patel, DO      . methylPREDNISolone sodium succinate (SOLU-MEDROL) injection 1,000 mg  1,000 mg Intravenous Once Donika K Patel, DO      . methylPREDNISolone sodium succinate (SOLU-MEDROL) injection 1,000 mg  1,000 mg Intravenous Once Donika K Patel, DO      . methylPREDNISolone sodium succinate (SOLU-MEDROL) injection 1,000 mg  1,000 mg Intravenous Once Donika K Patel, DO        BP 152/80 mmHg  Pulse 107  Temp(Src) 98.4 F (36.9 C) (Oral)  Resp 20  Ht 5\' 10"  (1.778 m)  Wt 177 lb (80.287 kg)  BMI 25.40 kg/m2  SpO2 98%     Review of Systems  Constitutional: Negative for fever, chills, appetite change and fatigue.  HENT: Negative for congestion, dental problem, ear pain, hearing loss, sore throat, tinnitus, trouble swallowing and voice change.   Eyes: Negative for pain, discharge and visual disturbance.  Respiratory: Negative for cough, chest tightness, wheezing and stridor.   Cardiovascular: Negative for chest pain, palpitations and leg swelling.  Gastrointestinal: Negative for nausea, vomiting, abdominal pain, diarrhea, constipation, blood in stool and abdominal distention.  Genitourinary: Negative for urgency, hematuria, flank pain, discharge, difficulty urinating and genital sores.  Musculoskeletal: Positive for gait problem. Negative for myalgias, back pain, joint swelling, arthralgias and neck stiffness.  Skin: Negative for rash.  Neurological: Positive for dizziness, syncope, weakness and light-headedness. Negative for speech difficulty, numbness and headaches.  Hematological: Negative for adenopathy. Does not bruise/bleed easily.  Psychiatric/Behavioral: Negative for behavioral problems and dysphoric mood. The patient is not  nervous/anxious.        Objective:   Physical Exam  Constitutional: He is oriented to person, place, and time. He appears well-developed.  Blood pressure 126/80 Pulse 90  HENT:  Head: Normocephalic.  Right Ear: External ear normal.  Left Ear: External ear normal.  Eyes: Conjunctivae and EOM are normal.  Neck: Normal range of motion.  Cardiovascular: Normal rate and normal heart sounds.   Pulmonary/Chest: Breath sounds normal.  Abdominal: Bowel sounds are normal.  Musculoskeletal: Normal range of motion. He exhibits no edema or tenderness.  Neurological: He is alert and oriented to person, place, and time.  Alert and appropriate Lower extremity weakness. Uses  a 4-point walker  Psychiatric: He has a normal mood and affect. His behavior is normal.          Assessment & Plan:   Essential hypertension Recurrent orthostatic hypotension Status post permanent pacemaker insertion History of SVT CIDP.  Continue PT.  Follow-up neurology  Recheck here 6 months  Nyoka Cowden, MD

## 2015-11-03 ENCOUNTER — Telehealth: Payer: Self-pay | Admitting: Internal Medicine

## 2015-11-03 ENCOUNTER — Telehealth: Payer: Self-pay | Admitting: Neurology

## 2015-11-03 DIAGNOSIS — R42 Dizziness and giddiness: Secondary | ICD-10-CM | POA: Diagnosis not present

## 2015-11-03 DIAGNOSIS — I10 Essential (primary) hypertension: Secondary | ICD-10-CM | POA: Diagnosis not present

## 2015-11-03 DIAGNOSIS — Z87891 Personal history of nicotine dependence: Secondary | ICD-10-CM | POA: Diagnosis not present

## 2015-11-03 NOTE — Telephone Encounter (Signed)
Evelyn with [facility] just left pt and his bp was 140/100  and 150/110  Standing (she is not with pt no)  Hr  78

## 2015-11-03 NOTE — Telephone Encounter (Signed)
[facility]urse with [facility] calling with elevated BP-140/100 sitting and 150/110 standing-pls advise

## 2015-11-03 NOTE — Telephone Encounter (Signed)
Jonathon [name] [birth year] He was calling regarding an Infusion Prescription. He had some questions about it. His number is [phone]. Thank you

## 2015-11-03 NOTE — Telephone Encounter (Signed)
Spoke with patient.  He said that someone called him about an infusion.  Sent [facility] a message to check into this.

## 2015-11-03 NOTE — Telephone Encounter (Signed)
Continue home blood pressure monitoring.  Office visit next week.  If blood pressures consistently greater than 150 over 90

## 2015-11-03 NOTE — Telephone Encounter (Signed)
Please see message and advise 

## 2015-11-03 NOTE — Telephone Encounter (Signed)
Referred home health to patient's PCP Dr. Burnice Logan, who monitors his BP.

## 2015-11-03 NOTE — Telephone Encounter (Signed)
Spoke to pt, told him received call about blood pressure being elevated and Dr.K said to continue monitoring. If blood pressures consistently greater than 150/90 need to make an appt next week. Pt verbalized understanding and said he is picking up a blood pressure monitor tomorrow. Told pt okay good, call Monday or Tues for appt if blood pressure elevated. Pt verbalized understanding.

## 2015-11-06 DIAGNOSIS — R42 Dizziness and giddiness: Secondary | ICD-10-CM | POA: Diagnosis not present

## 2015-11-06 DIAGNOSIS — Z87891 Personal history of nicotine dependence: Secondary | ICD-10-CM | POA: Diagnosis not present

## 2015-11-06 DIAGNOSIS — I10 Essential (primary) hypertension: Secondary | ICD-10-CM | POA: Diagnosis not present

## 2015-11-08 ENCOUNTER — Telehealth: Payer: Self-pay | Admitting: Internal Medicine

## 2015-11-08 NOTE — Telephone Encounter (Signed)
[facility] said that the patient does not qualify for financial assistance for in home infusion.  I will send order to [facility] for outpatient infusion.

## 2015-11-08 NOTE — Telephone Encounter (Signed)
New message   Pt is calling to report bp   6-25  156/94  2/27  164/99   172/107  2/27    153/97    Pt wants rn to call him

## 2015-11-08 NOTE — Telephone Encounter (Signed)
Will forward to Dr Raliegh Ip who follows

## 2015-11-08 NOTE — Telephone Encounter (Signed)
lmom for pt to call back to schedule appt for elevated Bp

## 2015-11-08 NOTE — Telephone Encounter (Signed)
Please call pt and schedule appt for Friday to see Dr.K regarding elevated blood pressure.

## 2015-11-09 DIAGNOSIS — I10 Essential (primary) hypertension: Secondary | ICD-10-CM | POA: Diagnosis not present

## 2015-11-09 DIAGNOSIS — Z87891 Personal history of nicotine dependence: Secondary | ICD-10-CM | POA: Diagnosis not present

## 2015-11-09 DIAGNOSIS — R42 Dizziness and giddiness: Secondary | ICD-10-CM | POA: Diagnosis not present

## 2015-11-09 NOTE — Telephone Encounter (Signed)
Please try pt again and schedule him to see Dr.K tomorrow.

## 2015-11-10 ENCOUNTER — Other Ambulatory Visit: Payer: Self-pay | Admitting: *Deleted

## 2015-11-10 DIAGNOSIS — G6181 Chronic inflammatory demyelinating polyneuritis: Secondary | ICD-10-CM

## 2015-11-13 NOTE — Telephone Encounter (Signed)
Pt is busy until Friday, but has been schedule for Friday. Pt will bring readings form his bp machine to visit.

## 2015-11-14 DIAGNOSIS — R42 Dizziness and giddiness: Secondary | ICD-10-CM | POA: Diagnosis not present

## 2015-11-14 DIAGNOSIS — I10 Essential (primary) hypertension: Secondary | ICD-10-CM | POA: Diagnosis not present

## 2015-11-14 DIAGNOSIS — Z87891 Personal history of nicotine dependence: Secondary | ICD-10-CM | POA: Diagnosis not present

## 2015-11-17 ENCOUNTER — Ambulatory Visit: Payer: Medicare Other | Admitting: Internal Medicine

## 2015-11-17 ENCOUNTER — Telehealth: Payer: Self-pay | Admitting: *Deleted

## 2015-11-17 DIAGNOSIS — I10 Essential (primary) hypertension: Secondary | ICD-10-CM | POA: Diagnosis not present

## 2015-11-17 DIAGNOSIS — R42 Dizziness and giddiness: Secondary | ICD-10-CM | POA: Diagnosis not present

## 2015-11-17 DIAGNOSIS — Z87891 Personal history of nicotine dependence: Secondary | ICD-10-CM | POA: Diagnosis not present

## 2015-11-17 NOTE — Telephone Encounter (Signed)
Called and left message for patient that I scheduled his infusions to start on the July 17 at 10:00.  Requested for him to call me to let me know that he got my message.

## 2015-11-20 ENCOUNTER — Ambulatory Visit (INDEPENDENT_AMBULATORY_CARE_PROVIDER_SITE_OTHER): Payer: Medicare Other | Admitting: Internal Medicine

## 2015-11-20 ENCOUNTER — Encounter: Payer: Self-pay | Admitting: Internal Medicine

## 2015-11-20 VITALS — BP 144/90 | HR 102 | Temp 98.1°F | Ht 70.0 in | Wt 178.0 lb

## 2015-11-20 DIAGNOSIS — I10 Essential (primary) hypertension: Secondary | ICD-10-CM

## 2015-11-20 DIAGNOSIS — I951 Orthostatic hypotension: Secondary | ICD-10-CM | POA: Diagnosis not present

## 2015-11-20 DIAGNOSIS — G6181 Chronic inflammatory demyelinating polyneuritis: Secondary | ICD-10-CM | POA: Diagnosis not present

## 2015-11-20 DIAGNOSIS — R42 Dizziness and giddiness: Secondary | ICD-10-CM | POA: Diagnosis not present

## 2015-11-20 DIAGNOSIS — Z87891 Personal history of nicotine dependence: Secondary | ICD-10-CM | POA: Diagnosis not present

## 2015-11-20 NOTE — Progress Notes (Signed)
Pre visit review using our clinic review tool, if applicable. No additional management support is needed unless otherwise documented below in the visit note. 

## 2015-11-20 NOTE — Progress Notes (Signed)
Subjective:    Patient ID: Jonathon [name], male    DOB: 1935-09-10, 80 y.o.   MRN: [name]  HPI 80 year old patient who has a history of essential hypertension.  He was hospitalized 3 weeks ago with orthostatic syncope.  He receives outpatient physical therapy and is followed by advanced home care.  There have been some elevated blood pressure readings periodically.  He also monitors his own blood pressure readings that have been slightly labile but usually in 140 over 85 range. He generally feels well but has had some increasing malaise over the past couple of days.    Past Medical History  Diagnosis Date  . BENIGN PROSTATIC HYPERTROPHY, HX OF 10/25/2008  . BRADYCARDIA 2005  . HYPERTENSION 11/21/2006  . 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 PACEMAKER, PERMANENT 2005    Gen change 2014 Medtronic Adaptic L dual-chamber pacemaker, serial [id number]   . TOBACCO ABUSE 10/25/2008    Quit 2012  . Iron deficiency anemia, unspecified 04/19/2013  . Asthma      Social History   Social History  . Marital Status: Divorced    Spouse Name: N/A  . Number of Children: 4  . Years of Education: N/A   Occupational History  . ENVIROMENTAL SERVICE    Social History Main Topics  . Smoking status: Former Smoker -- 0.25 packs/day for 46 years    Types: Cigarettes    Start date: 03/16/1965    Quit date: 06/15/2010  . Smokeless tobacco: Never Used     Comment: quit 3 years ago  . Alcohol Use: 0.0 oz/week    0 Standard drinks or equivalent per week     Comment: 4 beers per day  . Drug Use: No  . Sexual Activity: Not on file   Other Topics Concern  . 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    Past Surgical History  Procedure Laterality Date  . Pacemaker insertion  2005  . Colonoscopy  [id number]  . Skin graft Right 1962    wrist  . Permanent pacemaker generator change N/A  06/19/2012    Procedure: PERMANENT PACEMAKER GENERATOR CHANGE;  Surgeon: [name], MD; Medtronic Adaptic L dual-chamber pacemaker, serial [id number]      Family History  Problem Relation Age of Onset  . Breast cancer Sister     Living, 21  . Diabetes type II Brother     Living, 36  . Breast cancer Sister     Living, 21  . Heart attack Father     Died, 17  . Kidney disease Father   . Parkinson's disease Mother     Died, 39  . Diabetes Daughter     Living, 4  . Diabetes Son     Living, 95  . Colon cancer Neg Hx   . Rectal cancer Neg Hx   . Stomach cancer Neg Hx   . Ovarian cancer Daughter     No Known Allergies  Current Outpatient Prescriptions on File Prior to Visit  Medication Sig Dispense Refill  . Cyanocobalamin (VITAMIN B 12 PO) Take 1,000 mg by mouth daily.    . flecainide (TAMBOCOR) 150 MG tablet TAKE 1/2 TABLET BY MOUTH TWICE DAILY 30 tablet 6  . gabapentin (NEURONTIN) 100 MG capsule Take 2 capsules (200 mg total) by mouth at bedtime. 60 capsule 3  . Garlic 123XX123 MG  CAPS Take 2,000 mg by mouth daily as needed (supplement).     . irbesartan (AVAPRO) 300 MG tablet Take 300 mg by mouth daily.    . Omega-3 Fatty Acids (FISH OIL) 1000 MG CAPS Take 1 capsule by mouth every morning.     . tamsulosin (FLOMAX) 0.4 MG CAPS capsule TAKE 1 CAPSULE BY MOUTH EVERY DAY 90 capsule 3  . Vitamin D, Ergocalciferol, (DRISDOL) 50000 units CAPS capsule Take 1 capsule (50,000 Units total) by mouth every 7 (seven) days. (Patient taking differently: Take 50,000 Units by mouth every 7 (seven) days. No specific day) 12 capsule 0   Current Facility-Administered Medications on File Prior to Visit  Medication Dose Route Frequency Provider Last Rate Last Dose  . Immune Globulin 10% (OCTAGAM) IV infusion 85 g  1 g/kg Intravenous Q30 days Donika K Patel, DO      . methylPREDNISolone sodium succinate (SOLU-MEDROL) injection 1,000 mg  1,000 mg Intravenous Once Donika K Patel, DO      .  methylPREDNISolone sodium succinate (SOLU-MEDROL) injection 1,000 mg  1,000 mg Intravenous Once Donika K Patel, DO      . methylPREDNISolone sodium succinate (SOLU-MEDROL) injection 1,000 mg  1,000 mg Intravenous Once Donika K Patel, DO      . methylPREDNISolone sodium succinate (SOLU-MEDROL) injection 1,000 mg  1,000 mg Intravenous Once Donika K Patel, DO        BP 144/90 mmHg  Pulse 102  Temp(Src) 98.1 F (36.7 C) (Oral)  Ht 5\' 10"  (1.778 m)  Wt 178 lb (80.74 kg)  BMI 25.54 kg/m2  SpO2 96%      Review of Systems  Musculoskeletal: Positive for gait problem.  Neurological: Positive for weakness.       Objective:   Physical Exam  Constitutional: He appears well-developed and well-nourished. No distress.  Uses a walker Blood pressure 140-144/84-90  Cardiovascular: Normal rate and regular rhythm.   Pulmonary/Chest: Effort normal and breath sounds normal.  Musculoskeletal: He exhibits no edema.          Assessment & Plan:   Hypertension.  Somewhat labile.  We'll continue present regimen at this time.  We'll continue close outpatient monitoring.  Will will consider additional medications, but must be cautious in this patient with a history of orthostatic syncope

## 2015-11-20 NOTE — Patient Instructions (Addendum)
Limit your sodium (Salt) intake  Please check your blood pressure on a regular basis.  If it is consistently greater than 150/90, please make an office appointment.  Return in 3 months for follow-up  Nyoka Cowden, MD

## 2015-11-21 DIAGNOSIS — R42 Dizziness and giddiness: Secondary | ICD-10-CM | POA: Diagnosis not present

## 2015-11-21 DIAGNOSIS — I10 Essential (primary) hypertension: Secondary | ICD-10-CM | POA: Diagnosis not present

## 2015-11-21 DIAGNOSIS — Z87891 Personal history of nicotine dependence: Secondary | ICD-10-CM | POA: Diagnosis not present

## 2015-11-23 ENCOUNTER — Telehealth: Payer: Self-pay | Admitting: Internal Medicine

## 2015-11-23 DIAGNOSIS — Z87891 Personal history of nicotine dependence: Secondary | ICD-10-CM | POA: Diagnosis not present

## 2015-11-23 DIAGNOSIS — R42 Dizziness and giddiness: Secondary | ICD-10-CM | POA: Diagnosis not present

## 2015-11-23 DIAGNOSIS — I10 Essential (primary) hypertension: Secondary | ICD-10-CM | POA: Diagnosis not present

## 2015-11-23 NOTE — Telephone Encounter (Signed)
Frankie with [facility] would like verbal orders to continue PT 2 x/ 5

## 2015-11-23 NOTE — Telephone Encounter (Signed)
Spoke to [place], told her okay to continue PT 2 x's a week for 5 weeks for pt, okay per Dr. Sharion Dove verbalized understanding.

## 2015-11-24 ENCOUNTER — Ambulatory Visit (INDEPENDENT_AMBULATORY_CARE_PROVIDER_SITE_OTHER): Payer: Medicare Other | Admitting: Neurology

## 2015-11-24 ENCOUNTER — Encounter: Payer: Self-pay | Admitting: Neurology

## 2015-11-24 VITALS — BP 150/90 | HR 91 | Ht 70.0 in | Wt 179.4 lb

## 2015-11-24 DIAGNOSIS — G8220 Paraplegia, unspecified: Secondary | ICD-10-CM | POA: Diagnosis not present

## 2015-11-24 DIAGNOSIS — G6181 Chronic inflammatory demyelinating polyneuritis: Secondary | ICD-10-CM

## 2015-11-24 DIAGNOSIS — M6250 Muscle wasting and atrophy, not elsewhere classified, unspecified site: Secondary | ICD-10-CM

## 2015-11-24 NOTE — Progress Notes (Signed)
[facility]  Follow-up Visit   Date: 11/24/2015   OTILIO GROLEAU MRN: [id number] DOB: [birth year]   Interim History: Jonathon [name] is a 80 y.o. 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  He is here with his daughter, [name] today.    History of present illness: Patient was in his usual state of health until 2013. He has always been physical active and would go to the gym daily (walking up 10 miles/day). During the spring of 2013, he developed relatively subacute onset of left > right hamstring soreness without an inciting event. Pain initially started in the butt area and radiated down. There is associated proximal leg weakness and loss of muscle mass. Functionally, he is still able to complete all his usual activities, but has became much more cautious when climbing stairs.  In September 2014, he underwent NCS/EMG which did not show any myopathy or radiculopathy of the left lower extremity.  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  CK, aldolase, B12 was within normal limits. SPEP/UPEP shows monoclonal IgA kappa gammopathy and he underwent extensive hematologic testing, including bone marrow biopsy by Dr. Marin Olp which has been nondiagnostic.    In 2015, he continued to have progressive loss of muscle bulk over the upper arms and proximal legs. He has difficulty with climbing stairs and opening jars, and dyspnea on exertion. 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   He reports previously drinking 4 beers/day and on the weekends up to 6 beers/day.   He also reports having 15lb unintentional weight loss and has been eating little.  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  GM1 and  anti-MAG antibody is negative. There is evidence of severe multilevel spinal stenosis of the cervical spine, and milder findings in the lumbar region.  CSF studies shows increase oligoclonal bands with normal cell count, IgG index, and myelin basic proteins.  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   He completed 42-month of monthly IVMP and reports little benefit over the past few months, but no worsening.    UPDATE 07/22/2014:  Over the past month, he noticed increased weakness of his legs and has lost 11lb.  Appetite remains poor.  Last week at work, he work broke out in a sweat, became light headed, and when he stepped off the machine, he fell.  There was no loss of consciousness and he was able to get up by himself, but since then he has decided to stop working now.  He is no longer weight lifting at the gym, but [facility]  He continues to have problems with endurance and easily fatigue with walking.  UPDATE 08/23/2014:  [facility] sync[facility] spell secondary to orthostatic hypotension which occurred when he was getting ready to leave his cardiologist's office. He has since been started on mestinon and is using compression stockings. He was discharged to rehab facility where he is doing well with hopes of discharge home later this week. He has noticed new bilateral ankles swelling over the past few days, no associated pain or shortness of breath.   Recent lab  testing continues to show mild transaminitis.  He has gained 9lb!  UPDATE 09/08/2015:  Patient was last seen 1 year ago and reports being stable with respect to his neuropathy until early April 2017.  In fact, he was doing much better and was ambulating independently, back at the gym, and gained weight, too.  A few weeks ago, he was using a push mower to  cut the lawn and the following morning developed acute onset of bilateral leg weakness, describing his legs as "rubber".  He has to start using his rollator again and became too unsteady to shower at home, so started using the handicap shower at [facility].  He has suffered about 3 falls, but no significant injuries.    UPDATE 11/24/2015:  He reports having two fainting spells, one while at PT and the second while exiting the shower at the [facility].  He reports feeling very lightheaded and sweating prior to these spells.  He has been staying much better hydrated since this time.  He continues to walk with a walker and leg weakness tends to fluctuates with having good days and bad days.  No new neurological complaints.    Medications:  Current Outpatient Prescriptions on File Prior to Visit  Medication Sig Dispense Refill  . Cyanocobalamin (VITAMIN B 12 PO) Take 1,000 mg by mouth daily.    . flecainide (TAMBOCOR) 150 MG tablet TAKE 1/2 TABLET BY MOUTH TWICE DAILY 30 tablet 6  . gabapentin (NEURONTIN) 100 MG capsule Take 2 capsules (200 mg total) by mouth at bedtime. 60 capsule 3  . Garlic 4451 MG CAPS Take 2,000 mg by mouth daily as needed (supplement).     . irbesartan (AVAPRO) 300 MG tablet Take 300 mg by mouth daily.    . Omega-3 Fatty Acids (FISH OIL) 1000 MG CAPS Take 1 capsule by mouth every morning.     . tamsulosin (FLOMAX) 0.4 MG CAPS capsule TAKE 1 CAPSULE BY MOUTH EVERY DAY 90 capsule 3  . Vitamin D, Ergocalciferol, (DRISDOL) 50000 units CAPS capsule Take 1 capsule (50,000 Units total) by mouth every 7 (seven) days. (Patient taking differently: Take 50,000 Units by mouth every 7 (seven) days. No specific day) 12 capsule 0   Current Facility-Administered Medications on File Prior to Visit  Medication Dose Route Frequency Provider Last Rate Last Dose  . Immune Globulin 10% (OCTAGAM) IV infusion 85 g  1 g/kg Intravenous Q30 days Donika K Patel, DO      . methylPREDNISolone sodium succinate  (SOLU-MEDROL) injection 1,000 mg  1,000 mg Intravenous Once Donika K Patel, DO      . methylPREDNISolone sodium succinate (SOLU-MEDROL) injection 1,000 mg  1,000 mg Intravenous Once Donika K Patel, DO      . methylPREDNISolone sodium succinate (SOLU-MEDROL) injection 1,000 mg  1,000 mg Intravenous Once Donika K Patel, DO      . methylPREDNISolone sodium succinate (SOLU-MEDROL) injection 1,000 mg  1,000 mg Intravenous Once Donika K Patel, DO        Allergies: No Known Allergies   Review of Systems:  CONSTITUTIONAL: No fevers, chills, night sweats EYES: No visual changes or eye pain ENT: No hearing changes.  No history of nose bleeds.   RESPIRATORY: No cough, wheezing and shortness of breath.   CARDIOVASCULAR: Negative for chest pain, and palpitations.   GI: Negative for abdominal discomfort, blood in stools or black stools.  No recent change in bowel habits.   GU:  No history of incontinence.   MUSCLOSKELETAL: No history  of joint pain or swelling.  No myalgias.   SKIN: Negative for lesions, rash, and itching.   HEMATOLOGY/ONCOLOGY: Negative for prolonged bleeding, bruising easily, and swollen nodes.  ENDOCRINE: Negative for cold or heat intolerance, polydipsia or goiter.   PSYCH:  +depression or anxiety symptoms.   NEURO: As Above.   Vital Signs:  BP 150/90 mmHg  Pulse 91  Ht '5\' 10"'  (1.778 m)  Wt 179 lb 7 oz (81.392 kg)  BMI 25.75 kg/m2  SpO2 96%  General:  Well appearing, no acute distress, assisted with walker  Neurological Exam: MENTAL STATUS including orientation to time, place, person, recent and remote memory, attention span and concentration, language, and fund of knowledge is normal.  Speech is not dysarthric.  CRANIAL NERVES:  Pupils are round and reactive to light.  Normal conjugate, extra-ocular eye movements in all directions of gaze. No ptosis.  Face is symmetric.  Palate elevates symmetrically.  Normal shoulder shrug and head rotation.  Normal tongue strength and  range of motion, no deviation or fasciculation.  MOTOR:  Bilateral moderate ADM, FDI, ABP and marked bilateral quadriceps atrophy.  No fasciculations.  No tremor or pronator drift.  Neck flexion and extension is 5/5  Right Upper Extremity:    Left Upper Extremity:    Deltoid  5/5   Deltoid  5/5   Biceps  5/5   Biceps  5/5   Triceps  5/5   Triceps  5/5   Wrist extensors  5/5   Wrist extensors  5/5   Wrist flexors  5/5   Wrist flexors  5/5   Finger extensors  5/5   Finger extensors  5/5   Finger flexors  5/5   Finger flexors  5/5   Dorsal interossei  4/5   Dorsal interossei  4/5   Abductor pollicis  4+/5   Abductor pollicis * 4/5   Tone (Ashworth scale)  0  Tone (Ashworth scale)  0   Right Lower Extremity:    Left Lower Extremity:    Hip flexors  4/5   Hip flexors  4/5   Hip extensors  5/5   Hip extensors  5/5   Knee flexors  5/5   Knee flexors  5/5   Knee extensors  5/5   Knee extensors  5/5   Dorsiflexors  5/5   Dorsiflexors  5/5   Plantarflexors  5/5   Plantarflexors  5/5   Toe extensors  5/5   Toe extensors  5/5   Toe flexors  5/5   Toe flexors  5/5   Tone (Ashworth scale)  0  Tone (Ashworth scale)  0   MSRs:  Reflexes are 2+/4 in upper extremities, 1+ patella, and absent at Achilles.    SENSORY: Vibration and temperature reduced distal to left ankle.   COORDINATION/GAIT:  Able to rise from a chair without using arms.  Gait narrow based and stable, slow.  Data: Labs 01/13/2013:  CK 55, aldolase 7.9, TSH 0.39, copper 97, ceruloplasmin 30,  Labs 07/2013:  vitamin  B12 519, ANA neg, RF neg, ESR 5 Labs 08/10/2013:  CK 91, aldolase 14.9*, GM1 antibody - neg Labs 11/09/2013:  VEGF 118* (normal 31-86) Labs 06/02/2014:  VEGF <31 Labs 08/23/2014:  Vitamin B12 643, folate 18.6, CK 72  CSF 09/24/2013:  R1 W0 G59 Protein 42, IgG index 4.4, MBP < 2.0, WNV - neg, cytology - neg, OCB - 3 well defined bands present in CSF and serum, more prominent in CSF  EMG 01/25/2013:  1. Left median  neuropathy, at or distal to the wrist (carpal tunnel syndrome), moderate in degree electrically and predominately affecting motor fibers.  2. Left ulnar neuropathy at the elbow, moderately severe in degree electrically. A superimposed C8 motor radiculopathy cannot be excluded. 3. No evidence of diffuse motor axon loss or a generalized myopathy.  EMG 09/16/2013:  There is electrophysiological evidence of a chronic generalized sensorimotor polyradiculoneuropathy, axonal loss and demyelinating in type, affecting the left side. When compared to his previous EMG dated 01/25/2013, there is an interval worsening of findings.  CT cervical spine and lumbar spine 08/17/2013: 1. Advanced degenerative changes in the cervical spine, including multilevel bulky anterior endplate osteophytes.  2. Suspect multifactorial moderate to severe cervical spinal stenosis at the C5-C6 and C6-C7 levels. As the patient is not a  candidate for MRI, cervical spine CT myelogram would confirm.  3. Associated severe multifactorial neural foraminal stenosis at the right C4, right C5, right C6, bilateral C7, and bilateral C8 nerve levels.  4. Mild for age lumbar spine degenerative changes, chiefly facet arthropathy. Up to mild multifactorial spinal stenosis at L2-L3 and L3-L4.    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 CSF testing showed normal cell count and protein. 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  Labs showed monoclonal IgA kappa gammopathy on serum and saw Dr. Marin Olp, who also performed bone marrow biopsy which was  negative.  He also has transaminatis likely due to fatty liver.  The possibility of POEMS was raised given elevated VEGF, but repeat VEGF from 05/2014 returned normal.  Patient was started on solumedrol in June 2015 and had monthly IVMP until April 2016, without marked improvement.  Despite stopping treatment, he reports doing very well and was walking independently again however in May, he started experiencing a relapse.  He continues to have distal hand and and proximal leg weakness. His persistent hand weakness persists and may also be due to superimposed cervical foraminal stenosis at this level.    PLAN: He has been approved and scheduled to start IVIG next week, but patient unaware of the estimated cost to him, so will hold on this until we get more information. He was also instructed to look into options through the [place] since he is a veteran Continue to use 4-wheeled rollator as needed for long distances Continue out-patient physical therapy  Return to clinic in 72-month.  The duration of this appointment visit was 30 minutes of face-to-face time with the patient.  Greater than 50% of this time was spent in counseling, explanation of diagnosis, planning of further management, and coordination of care.   Thank you for allowing me to participate in patient's care.  If I can answer any additional questions, I would be pleased to do so.    Sincerely,    Donika K. PPosey Pronto DO

## 2015-11-24 NOTE — Patient Instructions (Signed)
We will call you once we get more information about IVIG and hold off on treatment until then Please explore your options with the [facility]  Return to clinic in 3 months

## 2015-11-24 NOTE — Progress Notes (Signed)
The hospital uses [employer] which costs $11,850 for the 5 days.

## 2015-11-27 ENCOUNTER — Encounter (HOSPITAL_COMMUNITY): Payer: Medicare Other

## 2015-11-28 ENCOUNTER — Encounter (HOSPITAL_COMMUNITY): Payer: Medicare Other

## 2015-11-28 DIAGNOSIS — Z87891 Personal history of nicotine dependence: Secondary | ICD-10-CM | POA: Diagnosis not present

## 2015-11-28 DIAGNOSIS — R42 Dizziness and giddiness: Secondary | ICD-10-CM | POA: Diagnosis not present

## 2015-11-28 DIAGNOSIS — I10 Essential (primary) hypertension: Secondary | ICD-10-CM | POA: Diagnosis not present

## 2015-11-29 ENCOUNTER — Encounter (HOSPITAL_COMMUNITY): Payer: Medicare Other

## 2015-11-30 ENCOUNTER — Encounter (HOSPITAL_COMMUNITY): Payer: Medicare Other

## 2015-11-30 DIAGNOSIS — R42 Dizziness and giddiness: Secondary | ICD-10-CM | POA: Diagnosis not present

## 2015-11-30 DIAGNOSIS — I10 Essential (primary) hypertension: Secondary | ICD-10-CM | POA: Diagnosis not present

## 2015-11-30 DIAGNOSIS — Z87891 Personal history of nicotine dependence: Secondary | ICD-10-CM | POA: Diagnosis not present

## 2015-12-01 ENCOUNTER — Encounter (HOSPITAL_COMMUNITY): Payer: Medicare Other

## 2015-12-05 ENCOUNTER — Other Ambulatory Visit: Payer: Self-pay | Admitting: *Deleted

## 2015-12-05 ENCOUNTER — Other Ambulatory Visit (INDEPENDENT_AMBULATORY_CARE_PROVIDER_SITE_OTHER): Payer: Medicare Other

## 2015-12-05 DIAGNOSIS — G6181 Chronic inflammatory demyelinating polyneuritis: Secondary | ICD-10-CM

## 2015-12-05 DIAGNOSIS — Z79899 Other long term (current) drug therapy: Secondary | ICD-10-CM

## 2015-12-05 DIAGNOSIS — I10 Essential (primary) hypertension: Secondary | ICD-10-CM | POA: Diagnosis not present

## 2015-12-05 DIAGNOSIS — Z87891 Personal history of nicotine dependence: Secondary | ICD-10-CM | POA: Diagnosis not present

## 2015-12-05 DIAGNOSIS — R42 Dizziness and giddiness: Secondary | ICD-10-CM | POA: Diagnosis not present

## 2015-12-05 LAB — COMPREHENSIVE METABOLIC PANEL
ALT: 72 U/L — ABNORMAL HIGH (ref 0–53)
AST: 104 U/L — AB (ref 0–37)
Albumin: 3.7 g/dL (ref 3.5–5.2)
Alkaline Phosphatase: 74 U/L (ref 39–117)
BUN: 9 mg/dL (ref 6–23)
CALCIUM: 8.9 mg/dL (ref 8.4–10.5)
CHLORIDE: 100 meq/L (ref 96–112)
CO2: 26 meq/L (ref 19–32)
CREATININE: 0.86 mg/dL (ref 0.40–1.50)
GFR: 110.65 mL/min (ref >60.00)
Glucose, Bld: 98 mg/dL (ref 70–99)
Potassium: 4.3 mEq/L (ref 3.5–5.1)
Sodium: 133 mEq/L — ABNORMAL LOW (ref 135–145)
Total Bilirubin: 0.7 mg/dL (ref 0.2–1.2)
Total Protein: 7.1 g/dL (ref 6.0–8.3)

## 2015-12-05 LAB — CBC
HCT: 33.9 % — ABNORMAL LOW (ref 39.0–52.0)
Hemoglobin: 11.4 g/dL — ABNORMAL LOW (ref 13.0–17.0)
MCHC: 33.6 g/dL (ref 30.0–36.0)
MCV: 85.2 fl (ref 78.0–100.0)
PLATELETS: 176 10*3/uL (ref 150.0–400.0)
RBC: 3.98 Mil/uL — ABNORMAL LOW (ref 4.22–5.81)
RDW: 14.9 % (ref 11.5–15.5)
WBC: 3.5 10*3/uL — ABNORMAL LOW (ref 4.0–10.5)

## 2015-12-05 LAB — IGA: IgA: 661 mg/dL — ABNORMAL HIGH (ref 68–378)

## 2015-12-07 DIAGNOSIS — I10 Essential (primary) hypertension: Secondary | ICD-10-CM | POA: Diagnosis not present

## 2015-12-07 DIAGNOSIS — Z87891 Personal history of nicotine dependence: Secondary | ICD-10-CM | POA: Diagnosis not present

## 2015-12-07 DIAGNOSIS — R42 Dizziness and giddiness: Secondary | ICD-10-CM | POA: Diagnosis not present

## 2015-12-07 LAB — PROTEIN ELECTROPHORESIS,RANDOM URN
Creatinine, Urine: 90 mg/dL (ref 20–370)
PROTEIN CREATININE RATIO: 89 mg/g{creat} (ref 22–128)
TOTAL PROTEIN, URINE: 8 mg/dL (ref 5–25)

## 2015-12-07 LAB — PROTEIN ELECTROPHORESIS, SERUM
ALPHA-2-GLOBULIN: 0.7 g/dL (ref 0.5–0.9)
Albumin ELP: 3.5 g/dL — ABNORMAL LOW (ref 3.8–4.8)
Alpha-1-Globulin: 0.3 g/dL (ref 0.2–0.3)
BETA GLOBULIN: 0.5 g/dL (ref 0.4–0.6)
Beta 2: 0.7 g/dL — ABNORMAL HIGH (ref 0.2–0.5)
GAMMA GLOBULIN: 1.1 g/dL (ref 0.8–1.7)
Total Protein, Serum Electrophoresis: 6.8 g/dL (ref 6.1–8.1)

## 2015-12-07 LAB — IMMUNOFIXATION ELECTROPHORESIS
IGA: 657 mg/dL — AB (ref 81–463)
IgG (Immunoglobin G), Serum: 1245 mg/dL (ref 694–1618)
IgM, Serum: 137 mg/dL (ref 48–271)

## 2015-12-08 ENCOUNTER — Encounter: Payer: Self-pay | Admitting: Internal Medicine

## 2015-12-08 ENCOUNTER — Encounter (INDEPENDENT_AMBULATORY_CARE_PROVIDER_SITE_OTHER): Payer: Self-pay

## 2015-12-08 ENCOUNTER — Ambulatory Visit (INDEPENDENT_AMBULATORY_CARE_PROVIDER_SITE_OTHER): Payer: Medicare Other | Admitting: Internal Medicine

## 2015-12-08 DIAGNOSIS — Z950 Presence of cardiac pacemaker: Secondary | ICD-10-CM | POA: Diagnosis not present

## 2015-12-08 LAB — CUP PACEART INCLINIC DEVICE CHECK
Battery Remaining Longevity: 127 mo
Battery Voltage: 2.77 V
Brady Statistic AP VP Percent: 1 %
Brady Statistic AS VP Percent: 21 %
Implantable Lead Implant Date: 20050121
Implantable Lead Location: 753860
Implantable Lead Model: 5076
Lead Channel Impedance Value: 633 Ohm
Lead Channel Pacing Threshold Amplitude: 0.5 V
Lead Channel Pacing Threshold Amplitude: 0.875 V
Lead Channel Pacing Threshold Pulse Width: 0.4 ms
Lead Channel Pacing Threshold Pulse Width: 0.4 ms
Lead Channel Sensing Intrinsic Amplitude: 5.6 mV
Lead Channel Setting Pacing Amplitude: 2 V
Lead Channel Setting Sensing Sensitivity: 2 mV
MDC IDC LEAD IMPLANT DT: 20050121
MDC IDC LEAD LOCATION: 753859
MDC IDC MSMT BATTERY IMPEDANCE: 230 Ohm
MDC IDC MSMT LEADCHNL RA IMPEDANCE VALUE: 501 Ohm
MDC IDC MSMT LEADCHNL RA PACING THRESHOLD AMPLITUDE: 0.75 V
MDC IDC MSMT LEADCHNL RA SENSING INTR AMPL: 4 mV
MDC IDC MSMT LEADCHNL RV PACING THRESHOLD AMPLITUDE: 0.5 V
MDC IDC MSMT LEADCHNL RV PACING THRESHOLD PULSEWIDTH: 0.4 ms
MDC IDC MSMT LEADCHNL RV PACING THRESHOLD PULSEWIDTH: 0.4 ms
MDC IDC SESS DTM: 20170728160005
MDC IDC SET LEADCHNL RV PACING AMPLITUDE: 2.5 V
MDC IDC SET LEADCHNL RV PACING PULSEWIDTH: 0.4 ms
MDC IDC STAT BRADY AP VS PERCENT: 0 %
MDC IDC STAT BRADY AS VS PERCENT: 78 %

## 2015-12-08 MED ORDER — IRBESARTAN 300 MG PO TABS: 300.0000 mg | ORAL_TABLET | Freq: Every day | ORAL | 3 refills | Status: DC

## 2015-12-08 NOTE — Patient Instructions (Signed)
Medication Instructions:  Your physician recommends that you continue on your current medications as directed. Please refer to the Current Medication list given to you today.   Labwork: None ordered   Testing/Procedures: None ordered   Follow-Up: Your physician wants you to follow-up in: 12 months with Dr Knox Saliva will receive a reminder letter in the mail two months in advance. If you don't receive a letter, please call our office to schedule the follow-up appointment.   Remote monitoring is used to monitor your Pacemaker  from home. This monitoring reduces the number of office visits required to check your device to one time per year. It allows [place] to keep an eye on the functioning of your device to ensure it is working properly. You are scheduled for a device check from home on 03/11/16. You may send your transmission at any time that day. If you have a wireless device, the transmission will be sent automatically. After your physician reviews your transmission, you will receive a postcard with your next transmission date.     Any Other Special Instructions Will Be Listed Below (If Applicable).     If you need a refill on your cardiac medications before your next appointment, please call your pharmacy.

## 2015-12-08 NOTE — Progress Notes (Signed)
HPI [name] returns today for followup. He is a pleasant 80 yo man with a h/o symptomatic bradycardia, s/p PPM. He has had problems with weakness, muscle loss and fatigue and has been diagnosed with a chronic demyelinating disorder. He also notes his palpitations have improved on flecainide. He denies dietary indiscretion with sodium. He is using a walker to help with ambulation and to prevent falls.  No Known Allergies   Current Outpatient Prescriptions  Medication Sig Dispense Refill  . b complex vitamins tablet Take 1 tablet by mouth daily.    . Cyanocobalamin (VITAMIN B 12 PO) Take 1,000 mg by mouth daily.    . flecainide (TAMBOCOR) 150 MG tablet TAKE 1/2 TABLET BY MOUTH TWICE DAILY 30 tablet 6  . gabapentin (NEURONTIN) 100 MG capsule Take 2 capsules (200 mg total) by mouth at bedtime. 60 capsule 3  . Garlic 123XX123 MG CAPS Take 2,000 mg by mouth daily as needed (supplement).     . irbesartan (AVAPRO) 300 MG tablet Take 300 mg by mouth daily.    . Omega-3 Fatty Acids (FISH OIL) 1000 MG CAPS Take 1 capsule by mouth every morning.     . tamsulosin (FLOMAX) 0.4 MG CAPS capsule TAKE 1 CAPSULE BY MOUTH EVERY DAY 90 capsule 3  . Vitamin D, Ergocalciferol, (DRISDOL) 50000 units CAPS capsule Take 1 capsule (50,000 Units total) by mouth every 7 (seven) days. (Patient taking differently: Take 50,000 Units by mouth every 7 (seven) days. No specific day) 12 capsule 0   No current facility-administered medications for this visit.    Facility-Administered Medications Ordered in Other Visits  Medication Dose Route Frequency Provider Last Rate Last Dose  . Immune Globulin 10% (OCTAGAM) IV infusion 85 g  1 g/kg Intravenous Q30 days Donika K Patel, DO      . methylPREDNISolone sodium succinate (SOLU-MEDROL) injection 1,000 mg  1,000 mg Intravenous Once Donika K Patel, DO      . methylPREDNISolone sodium succinate (SOLU-MEDROL) injection 1,000 mg  1,000 mg Intravenous Once Donika K Patel, DO      .  methylPREDNISolone sodium succinate (SOLU-MEDROL) injection 1,000 mg  1,000 mg Intravenous Once Donika K Patel, DO      . methylPREDNISolone sodium succinate (SOLU-MEDROL) injection 1,000 mg  1,000 mg Intravenous Once [name], DO         Past Medical History:  Diagnosis Date  . Asthma   . BENIGN PROSTATIC HYPERTROPHY, HX OF 10/25/2008  . BRADYCARDIA 2005  . HYPERTENSION 11/21/2006  . Iron deficiency anemia, unspecified 04/19/2013  . 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 PACEMAKER, PERMANENT 2005   Gen change 2014 Medtronic Adaptic L dual-chamber pacemaker, serial [id number]   . TOBACCO ABUSE 10/25/2008   Quit 2012    ROS:   All systems reviewed and negative except as noted in the HPI.   Past Surgical History:  Procedure Laterality Date  . COLONOSCOPY  [id number]  . PACEMAKER INSERTION  2005  . PERMANENT PACEMAKER GENERATOR CHANGE N/A 06/19/2012   Procedure: PERMANENT PACEMAKER GENERATOR CHANGE;  Surgeon: [name], MD; Medtronic Adaptic L dual-chamber pacemaker, serial [id number]    . SKIN GRAFT Right 1962   wrist     Family History  Problem Relation Age of Onset  . Heart attack Father     Died, 99  . Kidney disease Father   . Parkinson's disease Mother     Died, 15  . Breast cancer Sister     Living,  70  . Diabetes type II Brother     Living, 54  . Breast cancer Sister     Living, 77  . Diabetes Daughter     Living, 67  . Diabetes Son     Living, 69  . Ovarian cancer Daughter   . Colon cancer Neg Hx   . Rectal cancer Neg Hx   . Stomach cancer Neg Hx      Social History   Social History  . Marital status: Divorced    Spouse name: N/A  . Number of children: 4  . Years of education: N/A   Occupational History  . [employer]   Social History Main Topics  . Smoking status: Former Smoker    Packs/day: 0.25    Years: 46.00    Types: Cigarettes    Start date: 03/16/1965    Quit  date: 06/15/2010  . Smokeless tobacco: Never Used     Comment: quit 3 years ago  . Alcohol use 0.0 oz/week     Comment: 4 beers per day  . Drug use: No  . Sexual activity: Not on file   Other Topics Concern  . 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     BP 140/80   Pulse 89   Ht 5\' 10"  (1.778 m)   Wt 183 lb 6.4 oz (83.2 kg)   SpO2 98%   BMI 26.32 kg/m   Physical Exam:  stable appearing 80 yo man, NAD HEENT: Unremarkable Neck:  8 cm JVD, no thyromegally Back:  No CVA tenderness Lungs:  Minimal scattered rales, well healed PPM incision HEART:  Regular tachy rhythm, no murmurs, no rubs, no clicks, precordium is shaking Abd:  soft, positive bowel sounds, no organomegally, no rebound, no guarding Ext:  2 plus pulses, 1+ peripheral edema, no cyanosis, no clubbing, bilateral atrophy Skin:  No rashes no nodules Neuro:  CN II through XII intact, motor grossly intact  DEVICE  Normal device function.  See PaceArt for details.   Assess/Plan: 1. SVT - he is maintaining NSR very nicely on flecainide. 2. PPM- his medtronic DDD PM is working normally. Will recheck in several months. 3. HTN - his blood pressure is reasonably well controlled. No change in his meds.  Jonathon [name].D.

## 2015-12-12 ENCOUNTER — Encounter: Payer: Self-pay | Admitting: Internal Medicine

## 2015-12-12 DIAGNOSIS — Z87891 Personal history of nicotine dependence: Secondary | ICD-10-CM | POA: Diagnosis not present

## 2015-12-12 DIAGNOSIS — R42 Dizziness and giddiness: Secondary | ICD-10-CM | POA: Diagnosis not present

## 2015-12-12 DIAGNOSIS — I10 Essential (primary) hypertension: Secondary | ICD-10-CM | POA: Diagnosis not present

## 2015-12-15 ENCOUNTER — Telehealth: Payer: Self-pay | Admitting: Internal Medicine

## 2015-12-15 ENCOUNTER — Ambulatory Visit: Payer: Medicare Other | Admitting: Neurology

## 2015-12-15 NOTE — Telephone Encounter (Signed)
Henderson Newcomer, OT with [facility] would like to have verbal orders 2x for 1 week Home Health OT.

## 2015-12-18 ENCOUNTER — Other Ambulatory Visit: Payer: Self-pay | Admitting: *Deleted

## 2015-12-18 ENCOUNTER — Telehealth: Payer: Self-pay | Admitting: Neurology

## 2015-12-18 DIAGNOSIS — R42 Dizziness and giddiness: Secondary | ICD-10-CM | POA: Diagnosis not present

## 2015-12-18 DIAGNOSIS — I10 Essential (primary) hypertension: Secondary | ICD-10-CM | POA: Diagnosis not present

## 2015-12-18 DIAGNOSIS — Z87891 Personal history of nicotine dependence: Secondary | ICD-10-CM | POA: Diagnosis not present

## 2015-12-18 NOTE — Telephone Encounter (Signed)
Okay for home health OT

## 2015-12-18 NOTE — Telephone Encounter (Signed)
Left message letting Manuela Schwartz know verbal orders were okay.

## 2015-12-18 NOTE — Telephone Encounter (Signed)
Jonathon [name] 03/19/2036. His # is [id number]. He was just wondering about the infusion appointments. He would like you to please call him back. Thank you

## 2015-12-18 NOTE — Telephone Encounter (Signed)
Verbal okay? 

## 2015-12-19 ENCOUNTER — Other Ambulatory Visit: Payer: Self-pay | Admitting: *Deleted

## 2015-12-19 NOTE — Telephone Encounter (Signed)
Patient notified that infusions will begin on Tuesday, August 15 at 12:00.

## 2015-12-21 DIAGNOSIS — Z87891 Personal history of nicotine dependence: Secondary | ICD-10-CM | POA: Diagnosis not present

## 2015-12-21 DIAGNOSIS — R42 Dizziness and giddiness: Secondary | ICD-10-CM | POA: Diagnosis not present

## 2015-12-21 DIAGNOSIS — I10 Essential (primary) hypertension: Secondary | ICD-10-CM | POA: Diagnosis not present

## 2015-12-23 DIAGNOSIS — R42 Dizziness and giddiness: Secondary | ICD-10-CM | POA: Diagnosis not present

## 2015-12-23 DIAGNOSIS — Z87891 Personal history of nicotine dependence: Secondary | ICD-10-CM | POA: Diagnosis not present

## 2015-12-23 DIAGNOSIS — I10 Essential (primary) hypertension: Secondary | ICD-10-CM | POA: Diagnosis not present

## 2015-12-25 ENCOUNTER — Other Ambulatory Visit: Payer: Self-pay | Admitting: *Deleted

## 2015-12-25 ENCOUNTER — Telehealth: Payer: Self-pay | Admitting: *Deleted

## 2015-12-25 ENCOUNTER — Other Ambulatory Visit (HOSPITAL_COMMUNITY): Payer: Self-pay | Admitting: *Deleted

## 2015-12-25 NOTE — Telephone Encounter (Signed)
Privigin 1g/kg x 2 days, then 1g/kg monthly.

## 2015-12-25 NOTE — Telephone Encounter (Signed)
How are you ordering the privigen?

## 2015-12-26 ENCOUNTER — Encounter (HOSPITAL_COMMUNITY)
Admission: RE | Admit: 2015-12-26 | Discharge: 2015-12-26 | Disposition: A | Discharge status: Home / Self Care | Payer: Medicare Other | Source: Ambulatory Visit | Attending: Neurology | Admitting: Neurology

## 2015-12-26 ENCOUNTER — Other Ambulatory Visit: Payer: Self-pay | Admitting: *Deleted

## 2015-12-26 DIAGNOSIS — G6181 Chronic inflammatory demyelinating polyneuritis: Secondary | ICD-10-CM | POA: Insufficient documentation

## 2015-12-26 MED ORDER — IMMUNE GLOBULIN (HUMAN) 5 GM/50ML IV SOLN
1.0000 g/kg | INTRAVENOUS | Status: DC
  Administered 2015-12-26: 85 g via INTRAVENOUS
  Filled 2015-12-26: qty 50

## 2015-12-26 MED ORDER — DEXTROSE 5 % IV SOLN
INTRAVENOUS | Status: DC
  Administered 2015-12-26: 09:00:00 via INTRAVENOUS

## 2015-12-26 NOTE — Telephone Encounter (Signed)
Order in system.

## 2015-12-26 NOTE — Discharge Instructions (Signed)

## 2015-12-27 ENCOUNTER — Encounter (HOSPITAL_COMMUNITY)
Admission: RE | Admit: 2015-12-27 | Discharge: 2015-12-27 | Disposition: A | Discharge status: Home / Self Care | Payer: Medicare Other | Source: Ambulatory Visit | Attending: Neurology | Admitting: Neurology

## 2015-12-27 DIAGNOSIS — G6181 Chronic inflammatory demyelinating polyneuritis: Secondary | ICD-10-CM | POA: Diagnosis not present

## 2015-12-27 MED ORDER — IMMUNE GLOBULIN (HUMAN) 5 GM/50ML IV SOLN
85.0000 g | Freq: Once | INTRAVENOUS | Status: AC
  Administered 2015-12-27: 85 g via INTRAVENOUS
  Filled 2015-12-27: qty 50

## 2015-12-27 MED ORDER — IMMUNE GLOBULIN (HUMAN) 20 GM/200ML IV SOLN: 1.0000 g/kg | INTRAVENOUS | Status: DC

## 2015-12-29 DIAGNOSIS — I10 Essential (primary) hypertension: Secondary | ICD-10-CM | POA: Diagnosis not present

## 2015-12-29 DIAGNOSIS — R42 Dizziness and giddiness: Secondary | ICD-10-CM | POA: Diagnosis not present

## 2015-12-29 DIAGNOSIS — Z87891 Personal history of nicotine dependence: Secondary | ICD-10-CM | POA: Diagnosis not present

## 2016-01-01 DIAGNOSIS — R42 Dizziness and giddiness: Secondary | ICD-10-CM | POA: Diagnosis not present

## 2016-01-01 DIAGNOSIS — I10 Essential (primary) hypertension: Secondary | ICD-10-CM | POA: Diagnosis not present

## 2016-01-01 DIAGNOSIS — Z87891 Personal history of nicotine dependence: Secondary | ICD-10-CM | POA: Diagnosis not present

## 2016-01-25 ENCOUNTER — Ambulatory Visit (HOSPITAL_COMMUNITY)
Admission: RE | Admit: 2016-01-25 | Discharge: 2016-01-25 | Disposition: A | Discharge status: Home / Self Care | Payer: Medicare Other | Source: Ambulatory Visit | Attending: Neurology | Admitting: Neurology

## 2016-01-25 ENCOUNTER — Ambulatory Visit (HOSPITAL_COMMUNITY)
Admission: RE | Admit: 2016-01-25 | Discharge: 2016-01-25 | Disposition: A | Discharge status: Home / Self Care | Payer: Medicare Other | Source: Ambulatory Visit | Attending: Nurse Practitioner | Admitting: Nurse Practitioner

## 2016-01-25 ENCOUNTER — Telehealth: Payer: Self-pay | Admitting: *Deleted

## 2016-01-25 ENCOUNTER — Encounter (HOSPITAL_COMMUNITY): Payer: Self-pay | Admitting: Nurse Practitioner

## 2016-01-25 VITALS — BP 146/82 | HR 113 | Ht 70.0 in | Wt 178.0 lb

## 2016-01-25 DIAGNOSIS — J45909 Unspecified asthma, uncomplicated: Secondary | ICD-10-CM | POA: Insufficient documentation

## 2016-01-25 DIAGNOSIS — N400 Enlarged prostate without lower urinary tract symptoms: Secondary | ICD-10-CM | POA: Insufficient documentation

## 2016-01-25 DIAGNOSIS — Z79899 Other long term (current) drug therapy: Secondary | ICD-10-CM | POA: Insufficient documentation

## 2016-01-25 DIAGNOSIS — Z9889 Other specified postprocedural states: Secondary | ICD-10-CM | POA: Insufficient documentation

## 2016-01-25 DIAGNOSIS — Z87891 Personal history of nicotine dependence: Secondary | ICD-10-CM | POA: Diagnosis not present

## 2016-01-25 DIAGNOSIS — I471 Supraventricular tachycardia: Secondary | ICD-10-CM | POA: Insufficient documentation

## 2016-01-25 DIAGNOSIS — Z87442 Personal history of urinary calculi: Secondary | ICD-10-CM | POA: Diagnosis not present

## 2016-01-25 DIAGNOSIS — Z950 Presence of cardiac pacemaker: Secondary | ICD-10-CM | POA: Insufficient documentation

## 2016-01-25 DIAGNOSIS — I10 Essential (primary) hypertension: Secondary | ICD-10-CM | POA: Insufficient documentation

## 2016-01-25 DIAGNOSIS — I4891 Unspecified atrial fibrillation: Secondary | ICD-10-CM | POA: Insufficient documentation

## 2016-01-25 MED ORDER — DEXTROSE 5 % IV SOLN: INTRAVENOUS | Status: DC

## 2016-01-25 MED ORDER — IMMUNE GLOBULIN (HUMAN) 5 GM/50ML IV SOLN
1.0000 g/kg | INTRAVENOUS | Status: DC
  Filled 2016-01-25: qty 50

## 2016-01-25 MED ORDER — FLECAINIDE ACETATE 150 MG PO TABS: 75.0000 mg | ORAL_TABLET | Freq: Two times a day (BID) | ORAL | 6 refills | Status: DC

## 2016-01-25 MED ORDER — METOPROLOL SUCCINATE ER 25 MG PO TB24: 25.0000 mg | ORAL_TABLET | Freq: Every day | ORAL | 6 refills | Status: DC

## 2016-01-25 NOTE — Telephone Encounter (Signed)
Nancy from short stay said that patient is there for IVIG but his heart rate is 150-160.  They are going to hold off on therapy today.  I requested for her to have patient call his cardiologist and let me know when he can reschedule.

## 2016-01-25 NOTE — Patient Instructions (Signed)
Your physician has recommended you make the following change in your medication:  1)Restart Flecainide 1/2 tablet twice a day 2)Start Metoprolol (toprol) 25mg  once a day

## 2016-01-25 NOTE — Progress Notes (Signed)
Pt here to day for IVIG; HR 150-160; pt states he did not take Flecinide this am, but has been feeling weak last several days, and short of breath;  Dr. Posey Pronto notified, we will hold IVIG today; EKG requested; Trish with [facility] notified; Amber returned call, pt. to be seen this am at 1030 in [facility] clinic

## 2016-01-25 NOTE — Progress Notes (Signed)
Pt taken to [facility] clinic for further evaluation per Amber; pt states "I feel a little better now"

## 2016-01-26 NOTE — Progress Notes (Signed)
Primary Care Physician: Nyoka Cowden, MD Referring Physician:Amber Lynnell Jude, NP   Jonathon [name] is a 80 y.o. male with a h/o SVT. Followed by Dr. Lovena Le, Medttronic PPM, that was in short stay for administration of IVIG and was found to have an elevated heart rate. This was felt to represent afib and hence sent to afib clinic. Device interrogated by industry and results reviewed with Dr Lovena Le. Pt has been feeling weak for the last three days and correlates with him realizing by mistake that he has not taken flecainide since Monday. Interrogated reveals that pt has been having SVT, no afib/flutter. Now ekg shows ST 112 bpm.  Today, he denies symptoms of chest pain, shortness of breath, orthopnea, PND, lower extremity edema, dizziness, presyncope, syncope, or neurologic sequela. Positive for palpitations, fatigue x 3 days.  The patient is tolerating medications without difficulties and is otherwise without complaint today.   Past Medical History:  Diagnosis Date  . Asthma   . BENIGN PROSTATIC HYPERTROPHY, HX OF 10/25/2008  . BRADYCARDIA 2005  . HYPERTENSION 11/21/2006  . Iron deficiency anemia, unspecified 04/19/2013  . 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 PACEMAKER, PERMANENT 2005   Gen change 2014 Medtronic Adaptic L dual-chamber pacemaker, serial [id number]   . TOBACCO ABUSE 10/25/2008   Quit 2012   Past Surgical History:  Procedure Laterality Date  . COLONOSCOPY  [id number]  . PACEMAKER INSERTION  2005  . PERMANENT PACEMAKER GENERATOR CHANGE N/A 06/19/2012   Procedure: PERMANENT PACEMAKER GENERATOR CHANGE;  Surgeon: [name], MD; Medtronic Adaptic L dual-chamber pacemaker, serial [id number]    . SKIN GRAFT Right 1962   wrist    Current Outpatient Prescriptions  Medication Sig Dispense Refill  . b complex vitamins tablet Take 1 tablet by mouth daily.    . Cyanocobalamin (VITAMIN B 12 PO) Take 1,000 mg by mouth daily.    Marland Kitchen gabapentin  (NEURONTIN) 100 MG capsule Take 2 capsules (200 mg total) by mouth at bedtime. 60 capsule 3  . Garlic [id number] MG CAPS Take 2,000 mg by mouth daily as needed (supplement).     . irbesartan (AVAPRO) 300 MG tablet Take 1 tablet (300 mg total) by mouth daily. 90 tablet 3  . Omega-3 Fatty Acids (FISH OIL) 1000 MG CAPS Take 1 capsule by mouth every morning.     . tamsulosin (FLOMAX) 0.4 MG CAPS capsule TAKE 1 CAPSULE BY MOUTH EVERY DAY 90 capsule 3  . Vitamin D, Ergocalciferol, (DRISDOL) 50000 units CAPS capsule Take 1 capsule (50,000 Units total) by mouth every 7 (seven) days. (Patient taking differently: Take 50,000 Units by mouth every 7 (seven) days. No specific day) 12 capsule 0  . flecainide (TAMBOCOR) 150 MG tablet Take 0.5 tablets (75 mg total) by mouth 2 (two) times daily. 30 tablet 6  . metoprolol succinate (TOPROL XL) 25 MG 24 hr tablet Take 1 tablet (25 mg total) by mouth daily. 30 tablet 6   No current facility-administered medications for this encounter.    Facility-Administered Medications Ordered in Other Encounters  Medication Dose Route Frequency Provider Last Rate Last Dose  . methylPREDNISolone sodium succinate (SOLU-MEDROL) injection 1,000 mg  1,000 mg Intravenous Once Donika K Patel, DO      . methylPREDNISolone sodium succinate (SOLU-MEDROL) injection 1,000 mg  1,000 mg Intravenous Once Donika K Patel, DO      . methylPREDNISolone sodium succinate (SOLU-MEDROL) injection 1,000 mg  1,000 mg Intravenous Once [employer], DO      .  methylPREDNISolone sodium succinate (SOLU-MEDROL) injection 1,000 mg  1,000 mg Intravenous Once Donika Keith Rake, DO        No Known Allergies  Social History   Social History  . Marital status: Divorced    Spouse name: N/A  . Number of children: 4  . Years of education: N/A   Occupational History  . [employer]   Social History Main Topics  . Smoking status: Former Smoker    Packs/day: 0.25    Years: 46.00      Types: Cigarettes    Start date: 03/16/1965    Quit date: 06/15/2010  . Smokeless tobacco: Never Used     Comment: quit 3 years ago  . Alcohol use 0.0 oz/week     Comment: 4 beers per day  . Drug use: No  . Sexual activity: Not on file   Other Topics Concern  . 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    Family History  Problem Relation Age of Onset  . Heart attack Father     Died, 14  . Kidney disease Father   . Parkinson's disease Mother     Died, 66  . Breast cancer Sister     Living, 30  . Diabetes type II Brother     Living, 49  . Breast cancer Sister     Living, 22  . Diabetes Daughter     Living, 63  . Diabetes Son     Living, 20  . Ovarian cancer Daughter   . Colon cancer Neg Hx   . Rectal cancer Neg Hx   . Stomach cancer Neg Hx     ROS- All systems are reviewed and negative except as per the HPI above  Physical Exam: Vitals:   01/25/16 1045  BP: (!) 146/82  Pulse: (!) 113  Weight: 178 lb (80.7 kg)  Height: 5\' 10"  (1.778 m)    GEN- The patient is well appearing, alert and oriented x 3 today.   Head- normocephalic, atraumatic Eyes-  Sclera clear, conjunctiva pink Ears- hearing intact Oropharynx- clear Neck- supple, no JVP Lymph- no cervical lymphadenopathy Lungs- Clear to ausculation bilaterally, normal work of breathing Heart- Regular, rapid rate and rhythm, no murmurs, rubs or gallops, PMI not laterally displaced GI- soft, NT, ND, + BS Extremities- no clubbing, cyanosis, or edema MS- no significant deformity or atrophy Skin- no rash or lesion Psych- euthymic mood, full affect Neuro- strength and sensation are intact  EKG- Sinus tach at 112 bpm Pacer interrogated and reviewed with Dr. Lovena Le, SVT, no afib/flutter or ventricular arrhythmias, normal functioning device  Assessment and Plan:   1.SVT  Precipitated by inadvertent omission of  flecainide Restart flecainide at 75 mg bid(1/2 tab 150 mg), Rx refilled Start metoprolol ER at 25 mg bid F/u here in one week for repeat interrogation, after which he can resume IVIG  Butch Penny C. Carroll, [facility] [address] [place], [place] [address] [phone]

## 2016-01-29 ENCOUNTER — Ambulatory Visit: Payer: Medicare Other | Admitting: Physician Assistant

## 2016-02-01 ENCOUNTER — Encounter (HOSPITAL_COMMUNITY): Payer: Self-pay | Admitting: Nurse Practitioner

## 2016-02-01 ENCOUNTER — Ambulatory Visit (HOSPITAL_COMMUNITY)
Admission: RE | Admit: 2016-02-01 | Discharge: 2016-02-01 | Disposition: A | Discharge status: Home / Self Care | Payer: Medicare Other | Source: Ambulatory Visit | Attending: Nurse Practitioner | Admitting: Nurse Practitioner

## 2016-02-01 ENCOUNTER — Encounter: Payer: Self-pay | Admitting: Internal Medicine

## 2016-02-01 VITALS — BP 140/90 | HR 106 | Ht 70.0 in | Wt 176.6 lb

## 2016-02-01 DIAGNOSIS — I10 Essential (primary) hypertension: Secondary | ICD-10-CM | POA: Insufficient documentation

## 2016-02-01 DIAGNOSIS — Z87891 Personal history of nicotine dependence: Secondary | ICD-10-CM | POA: Diagnosis not present

## 2016-02-01 DIAGNOSIS — I471 Supraventricular tachycardia: Secondary | ICD-10-CM | POA: Diagnosis not present

## 2016-02-01 DIAGNOSIS — Z9889 Other specified postprocedural states: Secondary | ICD-10-CM | POA: Insufficient documentation

## 2016-02-01 DIAGNOSIS — J45909 Unspecified asthma, uncomplicated: Secondary | ICD-10-CM | POA: Insufficient documentation

## 2016-02-01 DIAGNOSIS — Z79899 Other long term (current) drug therapy: Secondary | ICD-10-CM | POA: Insufficient documentation

## 2016-02-01 DIAGNOSIS — Z87442 Personal history of urinary calculi: Secondary | ICD-10-CM | POA: Diagnosis not present

## 2016-02-01 NOTE — Progress Notes (Signed)
Primary Care Physician: Nyoka Cowden, MD Referring Physician: Chanetta Marshall, NP EP: Dr. Maretta Bees is a 80 y.o. male with a h/o SVT. Followed by Dr. Lovena Le, with [employer] PPM, that was in short stay for administration of IVIG and was found to have an elevated heart rate. This was felt to represent afib and hence sent to afib clinic. Device interrogated by industry and results reviewed with Dr Lovena Le. Pt has been feeling weak for the last three days and correlates with him realizing by mistake that he has not taken flecainide since Monday. Interrogated reveals that pt has been having SVT, no afib/flutter. Now ekg shows ST 112 bpm. He was asked to restart flecainde and low dose BB was added.  He returns to clinic today and is interrogated. No further rates of 150. He is averaging rates of 80 -100. He feels well and no further episodes of  rapid SVT. He wants to return to IVIG treatments and should be able to reschedule now.  Today, he denies symptoms of chest pain, shortness of breath, orthopnea, PND, lower extremity edema, dizziness, presyncope, syncope, or neurologic sequela. Positive for palpitations, fatigue x 3 days.  The patient is tolerating medications without difficulties and is otherwise without complaint today.   Past Medical History:  Diagnosis Date  . Asthma   . BENIGN PROSTATIC HYPERTROPHY, HX OF 10/25/2008  . BRADYCARDIA 2005  . HYPERTENSION 11/21/2006  . Iron deficiency anemia, unspecified 04/19/2013  . 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 PACEMAKER, PERMANENT 2005   Gen change 2014 Medtronic Adaptic L dual-chamber pacemaker, serial [id number]   . TOBACCO ABUSE 10/25/2008   Quit 2012   Past Surgical History:  Procedure Laterality Date  . COLONOSCOPY  [id number]  . PACEMAKER INSERTION  2005  . PERMANENT PACEMAKER GENERATOR CHANGE N/A 06/19/2012   Procedure: PERMANENT PACEMAKER GENERATOR CHANGE;  Surgeon: [name], MD;  Medtronic Adaptic L dual-chamber pacemaker, serial [id number]    . SKIN GRAFT Right 1962   wrist    Current Outpatient Prescriptions  Medication Sig Dispense Refill  . b complex vitamins tablet Take 1 tablet by mouth daily.    . Cyanocobalamin (VITAMIN B 12 PO) Take 1,000 mg by mouth daily.    . flecainide (TAMBOCOR) 150 MG tablet Take 0.5 tablets (75 mg total) by mouth 2 (two) times daily. 30 tablet 6  . gabapentin (NEURONTIN) 100 MG capsule Take 2 capsules (200 mg total) by mouth at bedtime. 60 capsule 3  . Garlic [id number] MG CAPS Take 2,000 mg by mouth daily as needed (supplement).     . irbesartan (AVAPRO) 300 MG tablet Take 1 tablet (300 mg total) by mouth daily. 90 tablet 3  . metoprolol succinate (TOPROL XL) 25 MG 24 hr tablet Take 1 tablet (25 mg total) by mouth daily. 30 tablet 6  . Omega-3 Fatty Acids (FISH OIL) 1000 MG CAPS Take 1 capsule by mouth every morning.     . tamsulosin (FLOMAX) 0.4 MG CAPS capsule TAKE 1 CAPSULE BY MOUTH EVERY DAY 90 capsule 3  . Vitamin D, Ergocalciferol, (DRISDOL) 50000 units CAPS capsule Take 1 capsule (50,000 Units total) by mouth every 7 (seven) days. (Patient taking differently: Take 50,000 Units by mouth every 7 (seven) days. No specific day) 12 capsule 0   No current facility-administered medications for this encounter.    Facility-Administered Medications Ordered in Other Encounters  Medication Dose Route Frequency Provider Last Rate Last Dose  .  methylPREDNISolone sodium succinate (SOLU-MEDROL) injection 1,000 mg  1,000 mg Intravenous Once Donika K Patel, DO      . methylPREDNISolone sodium succinate (SOLU-MEDROL) injection 1,000 mg  1,000 mg Intravenous Once Donika K Patel, DO      . methylPREDNISolone sodium succinate (SOLU-MEDROL) injection 1,000 mg  1,000 mg Intravenous Once Donika K Patel, DO      . methylPREDNISolone sodium succinate (SOLU-MEDROL) injection 1,000 mg  1,000 mg Intravenous Once Donika Keith Rake, DO        No Known  Allergies  Social History   Social History  . Marital status: Divorced    Spouse name: N/A  . Number of children: 4  . Years of education: N/A   Occupational History  . [employer]   Social History Main Topics  . Smoking status: Former Smoker    Packs/day: 0.25    Years: 46.00    Types: Cigarettes    Start date: 03/16/1965    Quit date: 06/15/2010  . Smokeless tobacco: Never Used     Comment: quit 3 years ago  . Alcohol use 0.0 oz/week     Comment: 4 beers per day  . Drug use: No  . Sexual activity: Not on file   Other Topics Concern  . 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    Family History  Problem Relation Age of Onset  . Heart attack Father     Died, 67  . Kidney disease Father   . Parkinson's disease Mother     Died, 53  . Breast cancer Sister     Living, 9  . Diabetes type II Brother     Living, 78  . Breast cancer Sister     Living, 60  . Diabetes Daughter     Living, 29  . Diabetes Son     Living, 33  . Ovarian cancer Daughter   . Colon cancer Neg Hx   . Rectal cancer Neg Hx   . Stomach cancer Neg Hx     ROS- All systems are reviewed and negative except as per the HPI above  Physical Exam: Vitals:   02/01/16 1058  BP: 140/90  Pulse: (!) 106  Weight: 176 lb 9.6 oz (80.1 kg)  Height: 5\' 10"  (1.778 m)    GEN- The patient is well appearing, alert and oriented x 3 today.   Head- normocephalic, atraumatic Eyes-  Sclera clear, conjunctiva pink Ears- hearing intact Oropharynx- clear Neck- supple, no JVP Lymph- no cervical lymphadenopathy Lungs- Clear to ausculation bilaterally, normal work of breathing Heart- Regular, rapid rate and rhythm, no murmurs, rubs or gallops, PMI not laterally displaced GI- soft, NT, ND, + BS Extremities- no clubbing, cyanosis, or edema MS- no significant deformity or atrophy Skin- no rash  or lesion Psych- euthymic mood, full affect Neuro- strength and sensation are intact  EKG- Sinus tach at 106 bpm,   interrogation shows v rates mostly 80- 00 bpm. No further rapid v rates   Assessment and Plan:   1.SVT  Precipitated by inadvertent omission of flecainide Continue flecainide at 75 mg bid(1/2 tab 150 mg), Rx refilled Continue metoprolol ER at 25 mg bid He can resume IVIG  F/u with Dr. Lovena Le as scheduled  Geroge Baseman. Eyden Dobie, [facility] [address] [place], [place] [address] [phone]

## 2016-02-06 ENCOUNTER — Ambulatory Visit: Payer: Medicare Other | Admitting: Neurology

## 2016-02-06 DIAGNOSIS — Z029 Encounter for administrative examinations, unspecified: Secondary | ICD-10-CM

## 2016-02-08 ENCOUNTER — Telehealth: Payer: Self-pay | Admitting: Neurology

## 2016-02-08 NOTE — Telephone Encounter (Signed)
Jonathon [name] Oct 14, 2035. He has had 2 Infusions and he went last week to have the third and his pulse rate was too high so he was unable to have the Infusion. He was unsure of how to get it rescheduled. His # is [id number]. Thank you

## 2016-02-08 NOTE — Telephone Encounter (Signed)
Called patient back and instructed him to call short stay to reschedule.  Patient agreed.

## 2016-02-12 ENCOUNTER — Telehealth: Payer: Self-pay | Admitting: Neurology

## 2016-02-12 NOTE — Telephone Encounter (Signed)
I spoke with Jonathon [name] and gave her instructions to schedule patient for privigin infusion monthly.

## 2016-02-12 NOTE — Telephone Encounter (Signed)
Jonathon [name] 01/26/16. [name] from short stay called regarding when he needed to get rescheduled for his infusion. Her # is [phone].  He called but she was unsure of when to schedule it. Thank you

## 2016-02-20 ENCOUNTER — Telehealth: Payer: Self-pay | Admitting: Neurology

## 2016-02-20 NOTE — Telephone Encounter (Signed)
PT called and said he needs to resume treatments, no too clear on what he was talking about/Dawn CB# [phone]

## 2016-02-20 NOTE — Telephone Encounter (Signed)
Left message for pt to return call to clinic to gather more information. I assume this may be in regards to telephone encounter from 02/08/16. If so patient will need to contact [facility] @ [phone]

## 2016-02-21 NOTE — Telephone Encounter (Signed)
Spoke with patient. Informed him he needed to call Short Stay. Pt agreed. I then called short stay spoke to  [facility] # is [phone], and verified that pt only needed to call and schedule.

## 2016-03-01 ENCOUNTER — Ambulatory Visit: Payer: Medicare Other | Admitting: Neurology

## 2016-03-05 ENCOUNTER — Telehealth: Payer: Self-pay | Admitting: Neurology

## 2016-03-05 ENCOUNTER — Ambulatory Visit (HOSPITAL_COMMUNITY): Admission: RE | Admit: 2016-03-05 | Payer: Medicare Other | Source: Ambulatory Visit

## 2016-03-05 NOTE — Telephone Encounter (Signed)
PT called and said he had to cancel his short stay because he could hardly walk and is requesting home care because he is having a hard time getting out/Dawn CB# [phone]

## 2016-03-05 NOTE — Telephone Encounter (Signed)
Return call to patient. He reports being much weaker over the past few days, requiring assistance of a walker to get around. I would like to reassess him in the clinic and have asked him to come for follow-up on Friday at 3:30 PM.    Donika K. Posey Pronto, DO

## 2016-03-05 NOTE — Telephone Encounter (Signed)
Ok to order [place]?

## 2016-03-08 ENCOUNTER — Encounter: Payer: Self-pay | Admitting: Neurology

## 2016-03-08 ENCOUNTER — Ambulatory Visit (INDEPENDENT_AMBULATORY_CARE_PROVIDER_SITE_OTHER): Payer: Medicare Other | Admitting: Neurology

## 2016-03-08 ENCOUNTER — Other Ambulatory Visit: Payer: Medicare Other

## 2016-03-08 VITALS — BP 130/76 | HR 96

## 2016-03-08 DIAGNOSIS — G6181 Chronic inflammatory demyelinating polyneuritis: Secondary | ICD-10-CM | POA: Diagnosis not present

## 2016-03-08 DIAGNOSIS — G8220 Paraplegia, unspecified: Secondary | ICD-10-CM | POA: Diagnosis not present

## 2016-03-08 NOTE — Patient Instructions (Addendum)
1.  NCS/EMG of the left arm and leg on November 9th  2.  Check blood work 3.  Continue IVIG 4.  We will send a referral for home health care  We will call you with the results and decide the next step

## 2016-03-08 NOTE — Progress Notes (Signed)
[facility]  Follow-up Visit   Date: 03/08/16   MACHAI DESMITH MRN: [id number] DOB: 1935-11-04   Interim History: NIRAV SWEDA is a 80 y.o. 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  He is here with his sister.    History of present illness: Patient was in his usual state of health until 2013. He has always been physical active and would go to the gym daily (walking up 10 miles/day). During the spring of 2013, he developed relatively subacute onset of left > right hamstring soreness without an inciting event. There is associated proximal leg weakness and loss of muscle mass. Functionally, he is still able to complete all his usual activities, but has became much more cautious when climbing stairs.  In September 2014, he underwent NCS/EMG which did not show any myopathy or radiculopathy of the left lower extremity.  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  CK, aldolase, B12 was within normal limits. SPEP/UPEP shows monoclonal IgA kappa gammopathy and he underwent extensive hematologic testing, including bone marrow biopsy by Dr. Marin Olp which was nondiagnostic.    In 2015, he continued to have progressive loss of muscle bulk in the hands and proximal legs. He has difficulty with climbing stairs and opening jars, and dyspnea on exertion. 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   He reports previously drinking 4 beers/day and on the weekends up to 6 beers/day. 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  GM1 and anti-MAG antibody is negative. There is evidence of severe multilevel spinal stenosis of the cervical spine, and milder findings in the lumbar region.  CSF  studies shows increase oligoclonal bands with normal cell count, IgG index, and myelin basic proteins.  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   He completed 1-month of monthly IVMP and reports little benefit over the past few months, but no worsening.    He stopped working in spring of 2016. Because of progressive weakness, he also stopped weightlifting at the gym.  [facility] for syncopal spell secondary to orthostatic hypotension.  He has since been started on mestinon and is using compression stockings.   UPDATE 09/08/2015:  Patient was last seen 1 year ago and reports being stable with respect to his neuropathy until early April 2017.  In fact, he was doing much better and was ambulating independently, back at the gym, and gained weight, too.  A few weeks ago, he was using a push mower to cut the lawn and the following morning developed acute onset of bilateral leg weakness, describing his legs as "rubber".  He has to start using his rollator again and became too unsteady to shower at home, [facility]  He has suffered about 3 falls, but no significant injuries.    UPDATE 11/24/2015:  He reports having two fainting spells, one while at PT and [facility]  He reports feeling very lightheaded and sweating prior to these spells.  He has been staying much better hydrated since this time.  He continues to walk with a walker and  leg weakness tends to fluctuates with having good days and bad days.  No new neurological complaints.   UPDATE 03/08/2016:  Patient was scheduled for a sooner appointment because earlier this week, he had to cancel his IVIG infusion after awakening with severe weakness where he was unable to stand.  Over the past 2-3 days, he has regained his  strength saw him and is able to stand and walk with walker. He does require help for more day-to-day things.  He completed his induction dose of IVIG and has not noticed any benefit. He unfortunately suffered a fall a few weeks ago in the bathtub, and could not seek help until the day and a half later when his daughter came to check on him.   Medications:  Current Outpatient Prescriptions on File Prior to Visit  Medication Sig Dispense Refill  . b complex vitamins tablet Take 1 tablet by mouth daily.    . Cyanocobalamin (VITAMIN B 12 PO) Take 1,000 mg by mouth daily.    . flecainide (TAMBOCOR) 150 MG tablet Take 0.5 tablets (75 mg total) by mouth 2 (two) times daily. 30 tablet 6  . gabapentin (NEURONTIN) 100 MG capsule Take 2 capsules (200 mg total) by mouth at bedtime. 60 capsule 3  . Garlic 1610 MG CAPS Take 2,000 mg by mouth daily as needed (supplement).     . irbesartan (AVAPRO) 300 MG tablet Take 1 tablet (300 mg total) by mouth daily. 90 tablet 3  . metoprolol succinate (TOPROL XL) 25 MG 24 hr tablet Take 1 tablet (25 mg total) by mouth daily. 30 tablet 6  . Omega-3 Fatty Acids (FISH OIL) 1000 MG CAPS Take 1 capsule by mouth every morning.     . tamsulosin (FLOMAX) 0.4 MG CAPS capsule TAKE 1 CAPSULE BY MOUTH EVERY DAY 90 capsule 3  . Vitamin D, Ergocalciferol, (DRISDOL) 50000 units CAPS capsule Take 1 capsule (50,000 Units total) by mouth every 7 (seven) days. (Patient taking differently: Take 50,000 Units by mouth every 7 (seven) days. No specific day) 12 capsule 0   Current Facility-Administered Medications on File Prior to Visit  Medication Dose Route Frequency Provider Last Rate Last Dose  . methylPREDNISolone sodium succinate (SOLU-MEDROL) injection 1,000 mg  1,000 mg Intravenous Once Atha Mcbain K Nikki Rusnak, DO      . methylPREDNISolone sodium succinate (SOLU-MEDROL) injection 1,000 mg  1,000 mg Intravenous Once Nera Haworth K Deaisha Welborn, DO      . methylPREDNISolone sodium succinate (SOLU-MEDROL)  injection 1,000 mg  1,000 mg Intravenous Once Torsten Weniger K Nicey Krah, DO      . methylPREDNISolone sodium succinate (SOLU-MEDROL) injection 1,000 mg  1,000 mg Intravenous Once Oluwadara Gorman K Liani Caris, DO        Allergies: No Known Allergies   Review of Systems:  CONSTITUTIONAL: No fevers, chills, night sweats EYES: No visual changes or eye pain ENT: No hearing changes.  No history of nose bleeds.   RESPIRATORY: No cough, wheezing and shortness of breath.   CARDIOVASCULAR: Negative for chest pain, and palpitations.   GI: Negative for abdominal discomfort, blood in stools or black stools.  No recent change in bowel habits.   GU:  No history of incontinence.   MUSCLOSKELETAL: No history of joint pain or swelling.  No myalgias.   SKIN: Negative for lesions, rash, and itching.   HEMATOLOGY/ONCOLOGY: Negative for prolonged bleeding, bruising easily, and swollen nodes.  ENDOCRINE: Negative for cold or heat intolerance, polydipsia or goiter.   PSYCH:  +depression or anxiety symptoms.  NEURO: As Above.   Vital Signs:  BP 130/76   Pulse 96   General:  Well appearing, no acute distress, assisted with wheelchair  Neurological Exam: MENTAL STATUS including orientation to time, place, person, recent and remote memory, attention span and concentration, language, and fund of knowledge is normal.  Speech is not dysarthric.  CRANIAL NERVES:  Pupils are round and reactive to light.  Normal conjugate, extra-ocular eye movements in all directions of gaze. No ptosis.  Face is symmetric.  Palate elevates symmetrically.  Normal shoulder shrug and head rotation.  Normal tongue strength and range of motion, no deviation or fasciculation.  MOTOR:  Bilateral severe ADM, FDI, ABP and marked bilateral quadriceps atrophy.  No fasciculations.  No tremor or pronator drift.  Neck flexion and extension is 5/5  Right Upper Extremity:    Left Upper Extremity:    Deltoid  5/5   Deltoid  5/5   Biceps  5/5   Biceps  5/5   Triceps   5/5   Triceps  5/5   Wrist extensors  5/5   Wrist extensors  5/5   Wrist flexors  5/5   Wrist flexors  5/5   Finger extensors  5/5   Finger extensors  5/5   Finger flexors  5/5   Finger flexors  5/5   Dorsal interossei  4/5   Dorsal interossei  4/5   Abductor pollicis  4/5   Abductor pollicis  4/5   Tone (Ashworth scale)  0  Tone (Ashworth scale)  0   Right Lower Extremity:    Left Lower Extremity:    Hip flexors  4/5   Hip flexors  4/5   Hip extensors  5/5   Hip extensors  5/5   Knee flexors  5/5   Knee flexors  5/5   Knee extensors  5/5   Knee extensors  5/5   Dorsiflexors  5/5   Dorsiflexors  5/5   Plantarflexors  5/5   Plantarflexors  5/5   Toe extensors  5/5   Toe extensors  5/5   Toe flexors  5/5   Toe flexors  5/5   Tone (Ashworth scale)  0  Tone (Ashworth scale)  0   MSRs:  Reflexes are 2+/4 in upper extremities, 1+ patella, and absent at Achilles.    SENSORY: Vibration and temperature reduced distal to left ankle.   COORDINATION/GAIT: Unable to rise from chair without using arms. He is able to stand, but appears very unstable. Gait was not tested   Data: Labs 01/13/2013:  CK 55, aldolase 7.9, TSH 0.39, copper 97, ceruloplasmin 30 Labs 07/2013:  vitamin  B12 519, ANA neg, RF neg, ESR 5 Labs 08/10/2013:  CK 91, aldolase 14.9*, GM1 antibody - neg Labs 11/09/2013:  VEGF 118* (normal 31-86) Labs 06/02/2014:  VEGF <31 Labs 08/23/2014:  Vitamin B12 643, folate 18.6, CK 72  CSF 09/24/2013:  R1 W0 G59 Protein 42, IgG index 4.4, MBP < 2.0, WNV - neg, cytology - neg, OCB - 3 well defined bands present in CSF and serum, more prominent in CSF  EMG 01/25/2013:   1. 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  2. Left ulnar neuropathy at the elbow, moderately severe in degree electrically. A superimposed C8 motor radiculopathy cannot be excluded. 3. No evidence of diffuse motor axon loss  or a generalized myopathy.  EMG 09/16/2013:  There is electrophysiological evidence  of a chronic generalized sensorimotor polyradiculoneuropathy, axonal loss and demyelinating in type, affecting the left side. When compared to his previous EMG dated 01/25/2013, there is an interval worsening of findings.  CT cervical spine and lumbar spine 08/17/2013: 1. Advanced degenerative changes in the cervical spine, including multilevel bulky anterior endplate osteophytes.  2. Suspect multifactorial moderate to severe cervical spinal stenosis at the C5-C6 and C6-C7 levels. As the patient is not a  candidate for MRI, cervical spine CT myelogram would confirm.  3. Associated severe multifactorial neural foraminal stenosis at the right C4, right C5, right C6, bilateral C7, and bilateral C8 nerve levels.  4. Mild for age lumbar spine degenerative changes, chiefly facet arthropathy. Up to mild multifactorial spinal stenosis at L2-L3 and L3-L4.   Labs showed monoclonal IgA kappa gammopathy on serum and saw Dr. Marin Olp, who also performed bone marrow biopsy which was negative.  He also has transaminatis likely due to fatty liver.  The possibility of POEMS was raised given elevated VEGF, but repeat VEGF from 05/2014 returned normal.   IMPRESSION: Mr. Asher is a 80 year-old gentleman returning for evaluation of bilateral hand and proximal leg weakness/atrophy.  He has had extensive evaluation including 2 previous electrodiagnostic testing, the most recent in 2015, was suggestive of a axonal and demyelinating polyradiculoneuropathy. CSF testing did not show albuminocytologic dissociation, however there was increased intrathecal protein synthesis based on the presence of oligoclonal bands, therefore an inflammatory-mediated process was suspected and a trial of methylprednisolone was given (June 2015 - April 2016).  He was doing well until May 2017 when he relapsed and developed worsening weakness and therefore switched to IVIG  which he has received 1 dose. There has been no significant improvement with this thus far.  Today, he presents with worsening weakness of the legs, imbalance, and gait instability. Interestingly, his motor strength remains unchanged but he is very unsteady with standing. I have reviewed all of his previous testing.  With his worsening clinical status and no improvement with IVIG or methylprednisolone, I have to question whether he truly has a diagnosis of CIDP.   He has significant degenerative changes in the cervical spine with severe spinal stenosis at C5-6 and C6-7 levels as well as multilevel foraminal stenosis.  Although this may certainly explain his hand weakness, I would not expect sensory abnormalities on his EMG, unless there was a superimposed entrapment neuropathy.  Further his imaging did not disclose lumbosacral stenosis and therefore would not explain his leg weakness, which he is most bothered by.  At this juncture, I would reinvestigate his symptoms as he is not responding to first line therapies for CIDP. I would like to repeat electrodiagnostic testing as well as cervical and lumbar imaging. Depending on the results of his NCS/EMG, we also discussed that a muscle/nerve biopsy may be helpful.    PLAN: At this juncture, I would like to repeat his CT cervical spine as well as electrodiagnostic testing of the left arm and leg.  Continue monthly IVIG.  This can be reassessed based on the results of his testing. Check CK, aldolase, B12, B1, ACE, heavy metal screen, c-ANCA, p-ANCA Referral to home health care will be sent.  Return to clinic in 72-month.  The duration of this appointment visit was 50 minutes of face-to-face time with the patient.  Greater than 50% of this time was spent in counseling, explanation of diagnosis, planning of further management, and coordination of care.   Thank you for allowing me to participate  in patient's care.  If I can answer any additional  questions, I would be pleased to do so.    Sincerely,    Phelicia Dantes K. Posey Pronto, DO

## 2016-03-09 LAB — CK: Total CK: 69 U/L (ref 7–232)

## 2016-03-10 LAB — HEAVY METALS PANEL, BLOOD
Arsenic: 4 mcg/L (ref <23)
Lead: 5 ug/dL — ABNORMAL HIGH (ref <5)
Mercury, B: <4 mcg/L (ref <=10)

## 2016-03-11 ENCOUNTER — Ambulatory Visit: Payer: Medicare Other | Admitting: Neurology

## 2016-03-11 ENCOUNTER — Telehealth: Payer: Self-pay | Admitting: Cardiology

## 2016-03-11 ENCOUNTER — Ambulatory Visit (INDEPENDENT_AMBULATORY_CARE_PROVIDER_SITE_OTHER): Payer: Medicare Other | Admitting: *Deleted

## 2016-03-11 ENCOUNTER — Other Ambulatory Visit: Payer: Self-pay | Admitting: *Deleted

## 2016-03-11 DIAGNOSIS — G6181 Chronic inflammatory demyelinating polyneuritis: Secondary | ICD-10-CM

## 2016-03-11 DIAGNOSIS — I495 Sick sinus syndrome: Secondary | ICD-10-CM

## 2016-03-11 DIAGNOSIS — G610 Guillain-Barre syndrome: Secondary | ICD-10-CM

## 2016-03-11 LAB — SJOGREN'S SYNDROME ANTIBODS(SSA + SSB)
SSA (RO) (ENA) ANTIBODY, IGG: NEGATIVE
SSB (La) (ENA) Antibody, IgG: <1

## 2016-03-11 LAB — ALDOLASE: Aldolase: 8.2 U/L — ABNORMAL HIGH (ref <=8.1)

## 2016-03-11 LAB — ANGIOTENSIN CONVERTING ENZYME: Angiotensin-Converting Enzyme: 57 U/L (ref 9–67)

## 2016-03-11 NOTE — Telephone Encounter (Signed)
LMOVM reminding pt to send remote transmission.   

## 2016-03-11 NOTE — Progress Notes (Signed)
Patient notified and scans ordered.

## 2016-03-12 LAB — ANCA SCREEN W REFLEX TITER: ANCA SCREEN: POSITIVE — AB

## 2016-03-12 LAB — C-ANCA TITER

## 2016-03-12 NOTE — Progress Notes (Signed)
Remote pacemaker transmission.   

## 2016-03-13 LAB — VITAMIN B1: Vitamin B1 (Thiamine): 29 nmol/L (ref 8–30)

## 2016-03-15 ENCOUNTER — Encounter: Payer: Self-pay | Admitting: Cardiology

## 2016-03-20 ENCOUNTER — Other Ambulatory Visit: Payer: Medicare Other

## 2016-03-21 ENCOUNTER — Ambulatory Visit: Payer: Medicare Other

## 2016-03-21 ENCOUNTER — Ambulatory Visit (INDEPENDENT_AMBULATORY_CARE_PROVIDER_SITE_OTHER): Payer: Medicare Other | Admitting: Neurology

## 2016-03-21 DIAGNOSIS — G8220 Paraplegia, unspecified: Secondary | ICD-10-CM

## 2016-03-21 DIAGNOSIS — G6181 Chronic inflammatory demyelinating polyneuritis: Secondary | ICD-10-CM | POA: Diagnosis not present

## 2016-03-21 LAB — CUP PACEART REMOTE DEVICE CHECK
Battery Impedance: 231 Ohm
Battery Remaining Longevity: 127 mo
Battery Voltage: 2.78 V
Brady Statistic AS VS Percent: 74 %
Date Time Interrogation Session: 20171030174356
Implantable Lead Implant Date: 20050121
Implantable Lead Location: 753859
Implantable Lead Model: 5076
Implantable Pulse Generator Implant Date: 20140207
Lead Channel Impedance Value: 559 Ohm
Lead Channel Impedance Value: 725 Ohm
Lead Channel Setting Pacing Amplitude: 2.5 V
Lead Channel Setting Pacing Amplitude: 2.5 V
Lead Channel Setting Pacing Pulse Width: 0.4 ms
Lead Channel Setting Sensing Sensitivity: 2 mV
MDC IDC LEAD IMPLANT DT: 20050121
MDC IDC LEAD LOCATION: 753860
MDC IDC MSMT LEADCHNL RA PACING THRESHOLD AMPLITUDE: 1.125 V
MDC IDC MSMT LEADCHNL RA PACING THRESHOLD PULSEWIDTH: 0.4 ms
MDC IDC MSMT LEADCHNL RV PACING THRESHOLD AMPLITUDE: 0.5 V
MDC IDC MSMT LEADCHNL RV PACING THRESHOLD PULSEWIDTH: 0.4 ms
MDC IDC STAT BRADY AP VP PERCENT: 0 %
MDC IDC STAT BRADY AP VS PERCENT: 0 %
MDC IDC STAT BRADY AS VP PERCENT: 26 %

## 2016-03-22 NOTE — Procedures (Signed)
[facility]  [address], [address]  [place], [place] [address] Tel: [phone] Fax:  [phone] Test Date:  03/21/2016  Patient: Jonathon [name] DOB: 08-01-1935 Physician: Narda Amber, DO  Sex: Male Height: 5\' 10"  Ref Phys: Narda Amber, DO  ID#: [id number] Temp: 33.7C Technician: Jerilynn Mages. Dean   Patient Complaints: This is a 80 year old gentleman with history of polyradiculoneuropathy referred for evaluation of worsening bilateral hand and leg weakness.  NCV & EMG Findings: Extensive electrodiagnostic testing of the left upper and lower extremity shows:  1. Left median sensory response shows prolonged latency (4.7 ms) and reduced amplitude (7.9 V).  Left ulnar sensory response is absent. Left radial sensory responses within normal limits. 2. Left median motor response shows prolonged latency (5.5 ms) and normal amplitude. Left ulnar motor response shows markedly reduced amplitude (3.6 mV) and decreased conduction velocity (A Elbow-B Elbow, 22 m/s).   3. Left sural and superficial peroneal sensory responses are absent. 4. Left peroneal motor response at the tibialis anterior is mildly reduced (2.6 mV). Left peroneal motor response at the extensor digitorum brevis is within normal limits. Left tibial motor response shows reduced amplitude (3.8 mV) and decreased conduction velocity (Knee-Ankle, 37 m/s). 5. Left tibial F-wave and bilateral tibial H reflex studies are prolonged. 6. In the left upper extremity, chronic motor axon loss changes are seen predominantly affecting ulnar innervated muscles and the abductor pollicis brevis. There is no evidence of active denervation. 7. In the left lower extremity, sparse chronic motor axon loss changes are seen in the muscles below the knee and rectus femoris muscles. Rare active denervation is seen in the lower lumbar paraspinal muscles.   Impression: 1. There is electrophysiologic evidence of a chronic, generalized sensorimotor  polyradiculoneuropathy affecting the left side. When compared to his EMG dated 09/17/2013, there has been mild progression of disease. 2. There is also evidence of a superimposed left ulnar neuropathy across the elbow, demyelinating with secondary axon loss, and left median neuropathy at wrist (carpal tunnel syndrome). 3. Specifically, there is no evidence of a diffuse myopathy.    ___________________________ Narda Amber, DO     Nerve Conduction Studies Anti Sensory Summary Table   Site NR Peak (ms) Norm Peak (ms) P-T Amp (V) Norm P-T Amp  Left Median Anti Sensory (2nd Digit)  33.7C  Wrist    4.7 <3.8 7.9 >10  Left Radial Anti Sensory (Base 1st Digit)  33.7C  Wrist    2.6 <2.8 12.6 >10  Left Sup Peroneal Anti Sensory (Ant Lat Mall)  33.7C  12 cm NR  <4.6  >3  Left Sural Anti Sensory (Lat Mall)  33.7C  Calf NR  <4.6  >3  Left Ulnar Anti Sensory (5th Digit)  33.7C  Wrist NR  <3.2  >5   Motor Summary Table   Site NR Onset (ms) Norm Onset (ms) O-P Amp (mV) Norm O-P Amp Site1 Site2 Delta-0 (ms) Dist (cm) Vel (m/s) Norm Vel (m/s)  Left Median Motor (Abd Poll Brev)  33.7C  Wrist    5.5 <4.0 5.1 >5 Elbow Wrist 5.3 27.0 51 >50  Elbow    10.8  4.5         Left Peroneal Motor (Ext Dig Brev)  33.7C  Ankle    4.8 <6.0 2.7 >2.5 B Fib Ankle 9.3 37.0 40 >40  B Fib    14.1  2.4  Poplt B Fib 2.5 10.0 40 >40  Poplt    16.6  2.4  Left Peroneal TA Motor (Tib Ant)  33.7C  Fib Head    3.9 <4.5 2.6 >3 Poplit Fib Head 2.3 10.0 43 >40  Poplit    6.2  2.6         Left Tibial Motor (Abd Hall Brev)  33.7C  Ankle    5.5 <6.0 3.8 >4 Knee Ankle 11.5 42.0 37 >40  Knee    17.0  2.1         Left Ulnar Motor (Abd Dig Minimi)  33.7C  Wrist    3.1 <3.1 3.6 >7 B Elbow Wrist 5.3 27.0 51 >50  B Elbow    8.4  3.2  A Elbow B Elbow 4.6 10.0 22 >50  A Elbow    13.0  2.8          F Wave Studies   NR F-Lat (ms) Lat Norm (ms) L-R F-Lat (ms) M-Lat (ms) FLat-MLat (ms)  Left Tibial (Mrkrs) (Abd  Hallucis)  33.7C     68.67 <55  5.96 62.71  Right Ulnar (Mrkrs) (Abd Dig Min)  33.7C     39.60 <33  3.67 35.93   H Reflex Studies   NR H-Lat (ms) Lat Norm (ms) L-R H-Lat (ms) M-Lat (ms) HLat-MLat (ms)  Left Tibial (Gastroc)  33.7C     44.08 <35 4.76 5.58 38.50  Right Tibial (Gastroc)  33.7C     39.32 <35 4.76 5.58 33.74   EMG   Side Muscle Ins Act Fibs Psw Fasc Number Recrt Dur Dur. Amp Amp. Poly Poly. Comment  Left 1stDorInt Nml Nml Nml Nml SMU Rapid All 1+ All 1+ All 1+ ATR  Left Abd Poll Brev Nml Nml Nml Nml 1- Rapid Some 1+ Some 1+ Nml Nml N/A  Left Flex Dig Long Nml Nml Nml Nml 1- Rapid Some 1+ Some 1+ Nml Nml N/A  Left ABD Dig Min Nml Nml Nml Nml 2- Rapid Many 1+ Many 1+ Nml Nml ATR  Left FlexCarpiUln Nml Nml Nml Nml 1- Rapid Some 1+ Some 1+ Nml Nml N/A  Left Triceps Nml Nml Nml Nml 1- Rapid Few 1+ Few 1+ Nml Nml N/A  Left AntTibialis Nml Nml Nml Nml 1- Mod-R Few 1+ Few 1+ Nml Nml N/A  Left Biceps Nml Nml Nml Nml Nml Nml Nml Nml Nml Nml Nml Nml N/A  Left PronatorTeres Nml Nml Nml Nml Nml Nml Nml Nml Nml Nml Nml Nml N/A  Left Ext Indicis Nml Nml Nml Nml Nml Nml Nml Nml Nml Nml Nml Nml N/A  Left Gastroc Nml Nml Nml Nml 1- Mod-R Few 1+ Few 1+ Nml Nml N/A  Left FlexPolLong Nml Nml Nml Nml 1- Rapid Few 1+ Few 1+ Nml Nml N/A  Left RectFemoris Nml Nml Nml Nml 1- Mod-R Few 1+ Few 1+ Nml Nml N/A  Left GluteusMed Nml Nml Nml Nml Nml Nml Nml Nml Nml Nml Nml Nml N/A  Left BicepsFemS Nml Nml Nml Nml Nml Nml Nml Nml Nml Nml Nml Nml N/A  Left Lumbo Parasp Low Nml 1+ Nml Nml Nml Nml Nml Nml Nml Nml Nml Nml N/A  Left Deltoid Nml Nml Nml Nml Nml Nml Nml Nml Nml Nml Nml Nml N/A  Left Cervical Parasp Low Nml Nml Nml Nml Nml Nml Nml Nml Nml Nml Nml Nml N/A      Waveforms:

## 2016-03-27 ENCOUNTER — Ambulatory Visit
Admission: RE | Admit: 2016-03-27 | Discharge: 2016-03-27 | Disposition: A | Discharge status: Home / Self Care | Payer: Medicare Other | Source: Ambulatory Visit | Attending: Neurology | Admitting: Neurology

## 2016-03-27 DIAGNOSIS — G6181 Chronic inflammatory demyelinating polyneuritis: Secondary | ICD-10-CM

## 2016-03-27 DIAGNOSIS — G610 Guillain-Barre syndrome: Secondary | ICD-10-CM

## 2016-03-27 DIAGNOSIS — M48061 Spinal stenosis, lumbar region without neurogenic claudication: Secondary | ICD-10-CM | POA: Diagnosis not present

## 2016-03-27 DIAGNOSIS — M47812 Spondylosis without myelopathy or radiculopathy, cervical region: Secondary | ICD-10-CM | POA: Diagnosis not present

## 2016-03-29 ENCOUNTER — Telehealth: Payer: Self-pay | Admitting: Neurology

## 2016-03-29 NOTE — Telephone Encounter (Addendum)
Called and discussed results of CT cervical spine, CT lumbar spine, labs, and EMG with patient.    Imaging of the cervical spine shows multilevel degenerative changes and bone spurring causing RIGHT C5 foraminal narrowing, R >L C6 foraminal narrowing, and bilateral C7 foraminal narrowing.  There is no impingement at the C8-T1 levels.  Lumbar spine imaging shows mild degenerative changes, no significant spinal or foraminal stenosis which would cause leg weakness.  Labs indicated mildly elevated c-ANCA titer of 1:40 and borderline abnormal lead level of 5 (normal <5).  I do not see these to be clinically relevant findings.    NCS/EMG of the left arm and leg 03/21/2016: 1. There is electrophysiologic evidence of a chronic, generalized sensorimotor polyradiculoneuropathy affecting the left side. When compared to his EMG dated 09/17/2013, there has been mild progression of disease. 2. There is also evidence of a superimposed left ulnar neuropathy across the elbow, demyelinating with secondary axon loss, and left median neuropathy at wrist (carpal tunnel syndrome). 3. Specifically, there is no evidence of a diffuse myopathy.   The severity of his hand weakness and atrophy seems to be stemming from entrapment neuropathy of the ulnar and median nerves on the left, which shows interval worsening.  Initially, it was thought that these changes were associated with his polyradiculoneuropathy, but because this has progressed despite adequate treatment of polyradiculoneuropathy, I would like to seek the opinion of [facility] for LEFT ulnar nerve transpition and CTS release.  Intrinsic muscle atrophy and weakness is not coming from his neck as there is no impingement at the C8-T1 level.    In the meantime, for his polyradiculoneuropathy we will continue monthly IVIG x 3 months and reassess.   Neah Sporrer K. Posey Pronto, DO

## 2016-03-29 NOTE — Telephone Encounter (Signed)
Referral faxed

## 2016-04-30 ENCOUNTER — Encounter: Payer: Self-pay | Admitting: Internal Medicine

## 2016-04-30 ENCOUNTER — Ambulatory Visit (INDEPENDENT_AMBULATORY_CARE_PROVIDER_SITE_OTHER): Payer: Medicare Other | Admitting: Internal Medicine

## 2016-04-30 VITALS — BP 170/80 | HR 125 | Temp 98.8°F | Ht 70.0 in | Wt 177.4 lb

## 2016-04-30 DIAGNOSIS — G6181 Chronic inflammatory demyelinating polyneuritis: Secondary | ICD-10-CM | POA: Diagnosis not present

## 2016-04-30 DIAGNOSIS — Z23 Encounter for immunization: Secondary | ICD-10-CM

## 2016-04-30 DIAGNOSIS — I10 Essential (primary) hypertension: Secondary | ICD-10-CM

## 2016-04-30 NOTE — Progress Notes (Signed)
Pre visit review using our clinic review tool, if applicable. No additional management support is needed unless otherwise documented below in the visit note. 

## 2016-04-30 NOTE — Patient Instructions (Signed)
Limit your sodium (Salt) intake  Please check your blood pressure on a regular basis.  If it is consistently greater than 150/90, please make an office appointment.    It is important that you exercise regularly, at least 20 minutes 3 to 4 times per week.  If you develop chest pain or shortness of breath seek  medical attention.  Neurology follow-up as scheduled  Return in 6 months for follow-up

## 2016-04-30 NOTE — Progress Notes (Signed)
Subjective:    Patient ID: Jonathon [name], male    DOB: June 17, 1935, 80 y.o.   MRN: [id number]  HPI 80 year old patient who is seen today in follow-up.  He has a history of essential hypertension.  Is also followed by cardiology with a history of SVT. 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  He has not responded well to IVIG or methylprednisolone , raising the possibility of an alternate diagnosis.  He has had recent testing including lab screen, repeat the nerve conduction studies as well as imaging studies of his cervical spine  Last visit here, he was using a walker in today is doing fairly well with a cane  Past Medical History:  Diagnosis Date  . Asthma   . BENIGN PROSTATIC HYPERTROPHY, HX OF 10/25/2008  . BRADYCARDIA 2005  . HYPERTENSION 11/21/2006  . Iron deficiency anemia, unspecified 04/19/2013  . 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 PACEMAKER, PERMANENT 2005   Gen change 2014 Medtronic Adaptic L dual-chamber pacemaker, serial [id number]   . TOBACCO ABUSE 10/25/2008   Quit 2012     Social History   Social History  . Marital status: Divorced    Spouse name: N/A  . Number of children: 4  . Years of education: N/A   Occupational History  . [employer]   Social History Main Topics  . Smoking status: Former Smoker    Packs/day: 0.25    Years: 46.00    Types: Cigarettes    Start date: 03/16/1965    Quit date: 06/15/2010  . Smokeless tobacco: Never Used     Comment: quit 3 years ago  . Alcohol use 0.0 oz/week     Comment: 4 beers per day  . Drug use: No  . Sexual activity: Not on file   Other Topics Concern  . 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    Past Surgical History:  Procedure Laterality Date  . COLONOSCOPY  [id number]  . PACEMAKER INSERTION  2005  . PERMANENT PACEMAKER GENERATOR  CHANGE N/A 06/19/2012   Procedure: PERMANENT PACEMAKER GENERATOR CHANGE;  Surgeon: [name], MD; Medtronic Adaptic L dual-chamber pacemaker, serial [id number]    . SKIN GRAFT Right 1962   wrist    Family History  Problem Relation Age of Onset  . Heart attack Father     Died, 77  . Kidney disease Father   . Parkinson's disease Mother     Died, 40  . Breast cancer Sister     Living, 16  . Diabetes type II Brother     Living, 89  . Breast cancer Sister     Living, 78  . Diabetes Daughter     Living, 48  . Diabetes Son     Living, 43  . Ovarian cancer Daughter   . Colon cancer Neg Hx   . Rectal cancer Neg Hx   . Stomach cancer Neg Hx     No Known Allergies  Current Outpatient Prescriptions on File Prior to Visit  Medication Sig Dispense Refill  . b complex vitamins tablet Take 1 tablet by mouth daily.    . Cyanocobalamin (VITAMIN B 12 PO) Take 1,000 mg by mouth daily.    . flecainide (TAMBOCOR) 150 MG tablet Take 0.5 tablets (75 mg total) by mouth 2 (two) times daily. [place]  tablet 6  . gabapentin (NEURONTIN) 100 MG capsule Take 2 capsules (200 mg total) by mouth at bedtime. 60 capsule 3  . Garlic 123XX123 MG CAPS Take 2,000 mg by mouth daily as needed (supplement).     . irbesartan (AVAPRO) 300 MG tablet Take 1 tablet (300 mg total) by mouth daily. 90 tablet 3  . metoprolol succinate (TOPROL XL) 25 MG 24 hr tablet Take 1 tablet (25 mg total) by mouth daily. 30 tablet 6  . Omega-3 Fatty Acids (FISH OIL) 1000 MG CAPS Take 1 capsule by mouth every morning.     . tamsulosin (FLOMAX) 0.4 MG CAPS capsule TAKE 1 CAPSULE BY MOUTH EVERY DAY 90 capsule 3  . Vitamin D, Ergocalciferol, (DRISDOL) 50000 units CAPS capsule Take 1 capsule (50,000 Units total) by mouth every 7 (seven) days. (Patient taking differently: Take 50,000 Units by mouth every 7 (seven) days. No specific day) 12 capsule 0   No current facility-administered medications on file prior to visit.     BP (!) 170/80 (BP  Location: Right Arm, Patient Position: Sitting, Cuff Size: Normal)   Pulse (!) 125   Temp 98.8 F (37.1 C) (Oral)   Ht 5\' 10"  (1.778 m)   Wt 177 lb 6.4 oz (80.5 kg)   SpO2 96%   BMI 25.45 kg/m      Review of Systems  Constitutional: Negative for appetite change, chills, fatigue and fever.  HENT: Negative for congestion, dental problem, ear pain, hearing loss, sore throat, tinnitus, trouble swallowing and voice change.   Eyes: Negative for pain, discharge and visual disturbance.  Respiratory: Negative for cough, chest tightness, wheezing and stridor.   Cardiovascular: Negative for chest pain, palpitations and leg swelling.  Gastrointestinal: Negative for abdominal distention, abdominal pain, blood in stool, constipation, diarrhea, nausea and vomiting.  Genitourinary: Negative for difficulty urinating, discharge, flank pain, genital sores, hematuria and urgency.  Musculoskeletal: Positive for gait problem. Negative for arthralgias, back pain, joint swelling, myalgias and neck stiffness.  Skin: Negative for rash.  Neurological: Positive for weakness. Negative for dizziness, syncope, speech difficulty, numbness and headaches.  Hematological: Negative for adenopathy. Does not bruise/bleed easily.  Psychiatric/Behavioral: Negative for behavioral problems and dysphoric mood. The patient is not nervous/anxious.        Objective:   Physical Exam  Constitutional: He is oriented to person, place, and time. He appears well-developed.  Repeat blood pressure 144/80  HENT:  Head: Normocephalic.  Right Ear: External ear normal.  Left Ear: External ear normal.  Eyes: Conjunctivae and EOM are normal.  Neck: Normal range of motion.  Cardiovascular: Normal rate and normal heart sounds.   Pulse 100  Pulmonary/Chest: Breath sounds normal.  Abdominal: Bowel sounds are normal.  Musculoskeletal: Normal range of motion. He exhibits no edema or tenderness.  Neurological: He is alert and oriented to  person, place, and time.  Psychiatric: He has a normal mood and affect. His behavior is normal.          Assessment & Plan:   Essential hypertension.  Fair control, although elevated systolic reading on arrival.  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  Follow-up neurology  High-dose flu vaccine administered  Nyoka Cowden

## 2016-05-04 ENCOUNTER — Emergency Department (HOSPITAL_COMMUNITY): Payer: Medicare Other

## 2016-05-04 ENCOUNTER — Emergency Department (HOSPITAL_COMMUNITY)
Admission: EM | Admit: 2016-05-04 | Discharge: 2016-05-04 | Disposition: A | Discharge status: Home / Self Care | Payer: Medicare Other | Attending: Emergency Medicine | Admitting: Emergency Medicine

## 2016-05-04 ENCOUNTER — Encounter (HOSPITAL_COMMUNITY): Payer: Self-pay | Admitting: Emergency Medicine

## 2016-05-04 DIAGNOSIS — Z87891 Personal history of nicotine dependence: Secondary | ICD-10-CM | POA: Diagnosis not present

## 2016-05-04 DIAGNOSIS — R404 Transient alteration of awareness: Secondary | ICD-10-CM | POA: Diagnosis not present

## 2016-05-04 DIAGNOSIS — Z79899 Other long term (current) drug therapy: Secondary | ICD-10-CM | POA: Diagnosis not present

## 2016-05-04 DIAGNOSIS — R55 Syncope and collapse: Secondary | ICD-10-CM

## 2016-05-04 DIAGNOSIS — Z950 Presence of cardiac pacemaker: Secondary | ICD-10-CM | POA: Diagnosis not present

## 2016-05-04 DIAGNOSIS — J45909 Unspecified asthma, uncomplicated: Secondary | ICD-10-CM | POA: Diagnosis not present

## 2016-05-04 DIAGNOSIS — I10 Essential (primary) hypertension: Secondary | ICD-10-CM | POA: Diagnosis not present

## 2016-05-04 LAB — CBG MONITORING, ED: GLUCOSE-CAPILLARY: 117 mg/dL — AB (ref 65–99)

## 2016-05-04 LAB — CBC WITH DIFFERENTIAL/PLATELET
BASOS ABS: 0 10*3/uL (ref 0.0–0.1)
BASOS PCT: 0 %
Eosinophils Absolute: 0 10*3/uL (ref 0.0–0.7)
Eosinophils Relative: 0 %
HEMATOCRIT: 32.9 % — AB (ref 39.0–52.0)
HEMOGLOBIN: 11.2 g/dL — AB (ref 13.0–17.0)
LYMPHS PCT: 18 %
Lymphs Abs: 0.7 10*3/uL (ref 0.7–4.0)
MCH: 29.6 pg (ref 26.0–34.0)
MCHC: 34 g/dL (ref 30.0–36.0)
MCV: 86.8 fL (ref 78.0–100.0)
MONO ABS: 0.5 10*3/uL (ref 0.1–1.0)
MONOS PCT: 12 %
NEUTROS ABS: 2.8 10*3/uL (ref 1.7–7.7)
NEUTROS PCT: 70 %
Platelets: 96 10*3/uL — ABNORMAL LOW (ref 150–400)
RBC: 3.79 MIL/uL — ABNORMAL LOW (ref 4.22–5.81)
RDW: 13.1 % (ref 11.5–15.5)
WBC: 3.9 10*3/uL — ABNORMAL LOW (ref 4.0–10.5)

## 2016-05-04 LAB — COMPREHENSIVE METABOLIC PANEL
ALBUMIN: 2.8 g/dL — AB (ref 3.5–5.0)
ALK PHOS: 124 U/L (ref 38–126)
ALT: 63 U/L (ref 17–63)
AST: 173 U/L — AB (ref 15–41)
Anion gap: 12 (ref 5–15)
BILIRUBIN TOTAL: 2.2 mg/dL — AB (ref 0.3–1.2)
BUN: 11 mg/dL (ref 6–20)
CALCIUM: 8.8 mg/dL — AB (ref 8.9–10.3)
CO2: 22 mmol/L (ref 22–32)
CREATININE: 1.35 mg/dL — AB (ref 0.61–1.24)
Chloride: 103 mmol/L (ref 101–111)
GFR calc Af Amer: 56 mL/min — ABNORMAL LOW (ref >60)
GFR, EST NON AFRICAN AMERICAN: 49 mL/min — AB (ref >60)
GLUCOSE: 128 mg/dL — AB (ref 65–99)
Potassium: 3.6 mmol/L (ref 3.5–5.1)
Sodium: 137 mmol/L (ref 135–145)
TOTAL PROTEIN: 7.1 g/dL (ref 6.5–8.1)

## 2016-05-04 LAB — TROPONIN I

## 2016-05-04 LAB — ETHANOL

## 2016-05-04 NOTE — ED Notes (Signed)
[employer] is sending pacemaker report.

## 2016-05-04 NOTE — Discharge Instructions (Signed)
As discussed, your evaluation today has been largely reassuring.  But, it is important that you monitor your condition carefully, and do not hesitate to return to the ED if you develop new, or concerning changes in your condition. ? ?Otherwise, please follow-up with your physician for appropriate ongoing care. ? ?

## 2016-05-04 NOTE — ED Notes (Signed)
ED Provider at bedside. 

## 2016-05-04 NOTE — ED Notes (Signed)
Dr. Vanita Panda given summary of upcoming [employer]onic report

## 2016-05-04 NOTE — ED Provider Notes (Signed)
[facility] DEPT Provider Note   CSN: [id number] Arrival date & time: 05/04/16  1212     History   Chief Complaint Chief Complaint  Patient presents with  . Loss of Consciousness    HPI Jonathon [name] is a 80 y.o. male.  HPI  Patient presents with concern of possible syncope. The patient is here with his daughter, after urgently arriving via EMS. Patient himself denies current complaints, states that he has been in his usual state of health, though he had an episode of likely syncope earlier today. He states that he has no new medication, diet, activity changes. Today, while receiving a haircut, he felt warm, flushed, and episode of syncope. There is no pain either before or after the episode, and there was complete return to normal cognition afterwards. Patient had 2   subsequent similar events, each brief, with return to complete cognition afterwards. No chest pain at all today. Daughter notes the patient has a history of alcohol abuse, has some suspicion of this.   Past Medical History:  Diagnosis Date  . Asthma   . BENIGN PROSTATIC HYPERTROPHY, HX OF 10/25/2008  . BRADYCARDIA 2005  . HYPERTENSION 11/21/2006  . Iron deficiency anemia, unspecified 04/19/2013  . 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 PACEMAKER, PERMANENT 2005   Gen change 2014 Medtronic Adaptic L dual-chamber pacemaker, serial [id number]   . TOBACCO ABUSE 10/25/2008   Quit 2012    Patient Active Problem List   Diagnosis Date Noted  . Orthostatic syncope   . Elevated liver function tests   . LOC (loss of consciousness) ([place]) 08/10/2014  . Syncope 08/10/2014  . CIDP (chronic inflammatory demyelinating polyneuropathy) ([place] [place]) 08/08/2014  . Sustained SVT ([place]) 07/27/2014  . SOB (shortness of breath) 09/22/2013  . 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 Abnormal LFTs 08/06/2013  . Iron deficiency anemia 04/19/2013  . Muscle weakness 01/13/2013  . External hemorrhoids  03/23/2011  . BRADYCARDIA 10/25/2008  . 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 BENIGN PROSTATIC HYPERTROPHY, HX OF 10/25/2008  . PACEMAKER, PERMANENT 10/25/2008  . Essential hypertension 11/21/2006  . BENIGN PROSTATIC HYPERTROPHY 11/21/2006  . History of colonic polyps 11/21/2006  . NEPHROLITHIASIS, HX OF 11/21/2006    Past Surgical History:  Procedure Laterality Date  . COLONOSCOPY  [id number]  . PACEMAKER INSERTION  2005  . PERMANENT PACEMAKER GENERATOR CHANGE N/A 06/19/2012   Procedure: PERMANENT PACEMAKER GENERATOR CHANGE;  Surgeon: [name], MD; Medtronic Adaptic L dual-chamber pacemaker, serial [id number]    . SKIN GRAFT Right 1962   wrist       Home Medications    Prior to Admission medications   Medication Sig Start Date End Date Taking? Authorizing Provider  b complex vitamins tablet Take 1 tablet by mouth daily.    Historical Provider, MD  Cyanocobalamin (VITAMIN B 12 PO) Take 1,000 mg by mouth daily.    Historical Provider, MD  flecainide (TAMBOCOR) 150 MG tablet Take 0.5 tablets (75 mg total) by mouth 2 (two) times daily. 01/25/16   Sherran Needs, NP  gabapentin (NEURONTIN) 100 MG capsule Take 2 capsules (200 mg total) by mouth at bedtime. 09/21/15   Lyndal Pulley, DO  Garlic [id number] MG CAPS Take 2,000 mg by mouth daily as needed (supplement).     Historical Provider, MD  irbesartan (AVAPRO) 300 MG tablet Take 1 tablet (300 mg total) by mouth daily. 12/08/15   [name], MD  metoprolol succinate (TOPROL XL) 25 MG 24 hr tablet Take  1 tablet (25 mg total) by mouth daily. 01/25/16   Sherran Needs, NP  Omega-3 Fatty Acids (FISH OIL) 1000 MG CAPS Take 1 capsule by mouth every morning.     Historical Provider, MD  tamsulosin (FLOMAX) 0.4 MG CAPS capsule TAKE 1 CAPSULE BY MOUTH EVERY DAY 09/19/15   Marletta Lor, MD  Vitamin D, Ergocalciferol, (DRISDOL) 50000 units CAPS capsule Take 1 capsule (50,000 Units total) by mouth every 7 (seven) days. Patient taking differently:  Take 50,000 Units by mouth every 7 (seven) days. No specific day 09/21/15   Lyndal Pulley, DO    Family History Family History  Problem Relation Age of Onset  . Heart attack Father     Died, 96  . Kidney disease Father   . Parkinson's disease Mother     Died, 58  . Breast cancer Sister     Living, 64  . Diabetes type II Brother     Living, 71  . Breast cancer Sister     Living, 6  . Diabetes Daughter     Living, 62  . Diabetes Son     Living, 66  . Ovarian cancer Daughter   . Colon cancer Neg Hx   . Rectal cancer Neg Hx   . Stomach cancer Neg Hx     Social History Social History  Substance Use Topics  . Smoking status: Former Smoker    Packs/day: 0.25    Years: 46.00    Types: Cigarettes    Start date: 03/16/1965    Quit date: 06/15/2010  . Smokeless tobacco: Never Used     Comment: quit 3 years ago  . Alcohol use No     Comment: 4 beers per day     Allergies   Patient has no known allergies.   Review of Systems Review of Systems  Constitutional:       Per HPI, otherwise negative  HENT:       Per HPI, otherwise negative  Respiratory:       Per HPI, otherwise negative  Cardiovascular:       Per HPI, otherwise negative  Gastrointestinal: Negative for vomiting.  Endocrine:       Negative aside from HPI  Genitourinary:       Neg aside from HPI   Musculoskeletal:       Per HPI, otherwise negative  Skin: Negative.   Neurological: Positive for syncope.  Psychiatric/Behavioral:       Prior alcohol abuse     Physical Exam Updated Vital Signs BP (!) 149/104   Pulse 85   Temp 98.1 F (36.7 C) (Oral)   Resp 17   Ht 5\' 10"  (1.778 m)   Wt 175 lb (79.4 kg)   SpO2 98%   BMI 25.11 kg/m   Physical Exam  Constitutional: He is oriented to person, place, and time. He appears well-developed. No distress.  HENT:  Head: Normocephalic and atraumatic.  Eyes: Conjunctivae and EOM are normal.  Cardiovascular: Normal rate and regular rhythm.     Pulmonary/Chest: Effort normal. No stridor. No respiratory distress.  Abdominal: He exhibits no distension.  Musculoskeletal: He exhibits no edema.  Neurological: He is alert and oriented to person, place, and time.  Skin: Skin is warm and dry.  Psychiatric: He has a normal mood and affect.  Nursing note and vitals reviewed.    ED Treatments / Results  Labs (all labs ordered are listed, but only abnormal results are displayed) Labs Reviewed  CBC WITH DIFFERENTIAL/PLATELET - Abnormal; Notable for the following:       Result Value   WBC 3.9 (*)    RBC 3.79 (*)    Hemoglobin 11.2 (*)    HCT 32.9 (*)    Platelets 96 (*)    All other components within normal limits  COMPREHENSIVE METABOLIC PANEL - Abnormal; Notable for the following:    Glucose, Bld 128 (*)    Creatinine, Ser 1.35 (*)    Calcium 8.8 (*)    Albumin 2.8 (*)    AST 173 (*)    Total Bilirubin 2.2 (*)    GFR calc non Af Amer 49 (*)    GFR calc Af Amer 56 (*)    All other components within normal limits  CBG MONITORING, ED - Abnormal; Notable for the following:    Glucose-Capillary 117 (*)    All other components within normal limits  TROPONIN I  ETHANOL  POCT CBG (FASTING - GLUCOSE)-MANUAL ENTRY    EKG  EKG Interpretation  Date/Time:  Saturday May 04 2016 12:22:19 EST Ventricular Rate:  84 PR Interval:    QRS Duration: 150 QT Interval:  437 QTC Calculation: 517 R Axis:   86 Text Interpretation:  Sinus rhythm Prolonged PR interval Right bundle branch block No significant change since last tracing Abnormal ekg Confirmed by Carmin Muskrat  MD [phone]) on 05/04/2016 4:00:57 PM      Radiology Dg Chest 2 View  Result Date: 05/04/2016 CLINICAL DATA:  Syncope EXAM: CHEST  2 VIEW COMPARISON:  08/10/2014 FINDINGS: Normal heart size. Do lead left subclavian pacemaker device. Clear lungs. IMPRESSION: No active cardiopulmonary disease. Electronically Signed   By: Marybelle Killings M.D.   On: 05/04/2016 13:27    Procedures Procedures (including critical care time)  Initial Impression / Assessment and Plan / ED Course  I have reviewed the triage vital signs and the nursing notes.  Pertinent labs & imaging results that were available during my care of the patient were reviewed by me and considered in my medical decision making (see chart for details).  Clinical Course    On repeat exam the patient is awake and alert. We again discussed all findings this far, and I have had a chance to review the interrogation of the pacemaker device, with no recent events, 2 episodes of tachycardia earlier today. He recalls some racing heartbeat 6 hours prior to his syncope episode, but no similar sensation are too syncope. Currently and he denies any complaints per He, his daughter and I had a lengthy conversation about all findings again, including suspicion for vagal episode given the reassuring physical exam, low suspicion for neurogenic, or cardiogenic etiology. Patient will follow-up with primary care and cardiology.   Final Clinical Impressions(s) / ED Diagnoses   Final diagnoses:  Syncope, unspecified syncope type     Carmin Muskrat, MD 05/04/16 [phone]

## 2016-05-04 NOTE — ED Notes (Signed)
EDCN at bedside to interrogate pacemaker

## 2016-05-04 NOTE — ED Notes (Signed)
Care handoff to Vicente Males, [place]

## 2016-06-05 ENCOUNTER — Encounter (HOSPITAL_COMMUNITY): Payer: Self-pay

## 2016-06-05 ENCOUNTER — Emergency Department (HOSPITAL_COMMUNITY): Payer: Medicare Other

## 2016-06-05 ENCOUNTER — Inpatient Hospital Stay (HOSPITAL_COMMUNITY)
Admission: EM | Admit: 2016-06-05 | Discharge: 2016-06-07 | DRG: 641 | Disposition: A | Discharge status: Home / Self Care | Payer: Medicare Other | Attending: Internal Medicine | Admitting: Internal Medicine

## 2016-06-05 DIAGNOSIS — R001 Bradycardia, unspecified: Secondary | ICD-10-CM | POA: Diagnosis present

## 2016-06-05 DIAGNOSIS — I10 Essential (primary) hypertension: Secondary | ICD-10-CM | POA: Diagnosis present

## 2016-06-05 DIAGNOSIS — Z8041 Family history of malignant neoplasm of ovary: Secondary | ICD-10-CM

## 2016-06-05 DIAGNOSIS — Z833 Family history of diabetes mellitus: Secondary | ICD-10-CM | POA: Diagnosis not present

## 2016-06-05 DIAGNOSIS — E860 Dehydration: Principal | ICD-10-CM | POA: Diagnosis present

## 2016-06-05 DIAGNOSIS — I5032 Chronic diastolic (congestive) heart failure: Secondary | ICD-10-CM | POA: Diagnosis not present

## 2016-06-05 DIAGNOSIS — Z803 Family history of malignant neoplasm of breast: Secondary | ICD-10-CM | POA: Diagnosis not present

## 2016-06-05 DIAGNOSIS — R5381 Other malaise: Secondary | ICD-10-CM | POA: Diagnosis not present

## 2016-06-05 DIAGNOSIS — R509 Fever, unspecified: Secondary | ICD-10-CM

## 2016-06-05 DIAGNOSIS — S299XXA Unspecified injury of thorax, initial encounter: Secondary | ICD-10-CM | POA: Diagnosis not present

## 2016-06-05 DIAGNOSIS — I471 Supraventricular tachycardia, unspecified: Secondary | ICD-10-CM | POA: Diagnosis present

## 2016-06-05 DIAGNOSIS — R000 Tachycardia, unspecified: Secondary | ICD-10-CM | POA: Diagnosis not present

## 2016-06-05 DIAGNOSIS — N400 Enlarged prostate without lower urinary tract symptoms: Secondary | ICD-10-CM | POA: Diagnosis present

## 2016-06-05 DIAGNOSIS — G6181 Chronic inflammatory demyelinating polyneuritis: Secondary | ICD-10-CM | POA: Diagnosis not present

## 2016-06-05 DIAGNOSIS — Z820 Family history of epilepsy and other diseases of the nervous system: Secondary | ICD-10-CM | POA: Diagnosis not present

## 2016-06-05 DIAGNOSIS — E869 Volume depletion, unspecified: Secondary | ICD-10-CM | POA: Diagnosis present

## 2016-06-05 DIAGNOSIS — W19XXXA Unspecified fall, initial encounter: Secondary | ICD-10-CM | POA: Diagnosis not present

## 2016-06-05 DIAGNOSIS — Z841 Family history of disorders of kidney and ureter: Secondary | ICD-10-CM

## 2016-06-05 DIAGNOSIS — R55 Syncope and collapse: Secondary | ICD-10-CM | POA: Diagnosis not present

## 2016-06-05 DIAGNOSIS — R4020 Unspecified coma: Secondary | ICD-10-CM

## 2016-06-05 DIAGNOSIS — T671XXA Heat syncope, initial encounter: Secondary | ICD-10-CM

## 2016-06-05 DIAGNOSIS — N182 Chronic kidney disease, stage 2 (mild): Secondary | ICD-10-CM | POA: Diagnosis present

## 2016-06-05 DIAGNOSIS — Z87891 Personal history of nicotine dependence: Secondary | ICD-10-CM

## 2016-06-05 DIAGNOSIS — Z8249 Family history of ischemic heart disease and other diseases of the circulatory system: Secondary | ICD-10-CM

## 2016-06-05 DIAGNOSIS — Z950 Presence of cardiac pacemaker: Secondary | ICD-10-CM | POA: Diagnosis present

## 2016-06-05 DIAGNOSIS — K760 Fatty (change of) liver, not elsewhere classified: Secondary | ICD-10-CM

## 2016-06-05 DIAGNOSIS — I130 Hypertensive heart and chronic kidney disease with heart failure and stage 1 through stage 4 chronic kidney disease, or unspecified chronic kidney disease: Secondary | ICD-10-CM | POA: Diagnosis present

## 2016-06-05 DIAGNOSIS — T796XXA Traumatic ischemia of muscle, initial encounter: Secondary | ICD-10-CM | POA: Diagnosis present

## 2016-06-05 DIAGNOSIS — S069X9A Unspecified intracranial injury with loss of consciousness of unspecified duration, initial encounter: Secondary | ICD-10-CM | POA: Diagnosis not present

## 2016-06-05 HISTORY — DX: Chronic diastolic (congestive) heart failure: I50.32

## 2016-06-05 HISTORY — DX: Chronic inflammatory demyelinating polyneuritis: G61.81

## 2016-06-05 HISTORY — DX: Supraventricular tachycardia, unspecified: I47.10

## 2016-06-05 HISTORY — DX: Supraventricular tachycardia: I47.1

## 2016-06-05 LAB — CBC WITH DIFFERENTIAL/PLATELET
Basophils Absolute: 0 10*3/uL (ref 0.0–0.1)
Basophils Relative: 0 %
Eosinophils Absolute: 0 10*3/uL (ref 0.0–0.7)
Eosinophils Relative: 0 %
HCT: 36.3 % — ABNORMAL LOW (ref 39.0–52.0)
Hemoglobin: 12.8 g/dL — ABNORMAL LOW (ref 13.0–17.0)
LYMPHS ABS: 0.9 10*3/uL (ref 0.7–4.0)
LYMPHS PCT: 15 %
MCH: 30.1 pg (ref 26.0–34.0)
MCHC: 35.3 g/dL (ref 30.0–36.0)
MCV: 85.4 fL (ref 78.0–100.0)
MONO ABS: 0.7 10*3/uL (ref 0.1–1.0)
Monocytes Relative: 11 %
Neutro Abs: 4.7 10*3/uL (ref 1.7–7.7)
Neutrophils Relative %: 74 %
Platelets: 162 10*3/uL (ref 150–400)
RBC: 4.25 MIL/uL (ref 4.22–5.81)
RDW: 14.1 % (ref 11.5–15.5)
WBC: 6.3 10*3/uL (ref 4.0–10.5)

## 2016-06-05 LAB — COMPREHENSIVE METABOLIC PANEL
ALBUMIN: 2.7 g/dL — AB (ref 3.5–5.0)
ALK PHOS: 136 U/L — AB (ref 38–126)
ALT: 99 U/L — AB (ref 17–63)
AST: 285 U/L — AB (ref 15–41)
Anion gap: 13 (ref 5–15)
BUN: 19 mg/dL (ref 6–20)
CALCIUM: 8.5 mg/dL — AB (ref 8.9–10.3)
CHLORIDE: 98 mmol/L — AB (ref 101–111)
CO2: 21 mmol/L — AB (ref 22–32)
CREATININE: 1.35 mg/dL — AB (ref 0.61–1.24)
GFR calc Af Amer: 56 mL/min — ABNORMAL LOW (ref >60)
GFR calc non Af Amer: 49 mL/min — ABNORMAL LOW (ref >60)
GLUCOSE: 106 mg/dL — AB (ref 65–99)
Potassium: 5 mmol/L (ref 3.5–5.1)
SODIUM: 132 mmol/L — AB (ref 135–145)
Total Bilirubin: 2.7 mg/dL — ABNORMAL HIGH (ref 0.3–1.2)
Total Protein: 7.2 g/dL (ref 6.5–8.1)

## 2016-06-05 LAB — URINALYSIS, ROUTINE W REFLEX MICROSCOPIC
BILIRUBIN URINE: NEGATIVE
Glucose, UA: NEGATIVE mg/dL
KETONES UR: 5 mg/dL — AB
Leukocytes, UA: NEGATIVE
Nitrite: NEGATIVE
PROTEIN: 30 mg/dL — AB
Specific Gravity, Urine: 1.021 (ref 1.005–1.030)
pH: 6 (ref 5.0–8.0)

## 2016-06-05 LAB — LACTIC ACID, PLASMA: Lactic Acid, Venous: 1.8 mmol/L (ref 0.5–1.9)

## 2016-06-05 LAB — INFLUENZA PANEL BY PCR (TYPE A & B)
Influenza A By PCR: NEGATIVE
Influenza B By PCR: NEGATIVE

## 2016-06-05 LAB — PHOSPHORUS: Phosphorus: 2.7 mg/dL (ref 2.5–4.6)

## 2016-06-05 LAB — PROCALCITONIN: Procalcitonin: 0.48 ng/mL

## 2016-06-05 LAB — I-STAT CG4 LACTIC ACID, ED: Lactic Acid, Venous: 2.36 mmol/L (ref 0.5–1.9)

## 2016-06-05 LAB — CK
Total CK: 3535 U/L — ABNORMAL HIGH (ref 49–397)
Total CK: 2401 U/L — ABNORMAL HIGH (ref 49–397)

## 2016-06-05 LAB — MAGNESIUM: MAGNESIUM: 1.4 mg/dL — AB (ref 1.7–2.4)

## 2016-06-05 LAB — I-STAT TROPONIN, ED: Troponin i, poc: 0.01 ng/mL (ref 0.00–0.08)

## 2016-06-05 MED ORDER — IRBESARTAN 300 MG PO TABS
300.0000 mg | ORAL_TABLET | Freq: Every day | ORAL | Status: DC
  Administered 2016-06-05 – 2016-06-06 (×2): 300 mg via ORAL
  Filled 2016-06-05 (×3): qty 1

## 2016-06-05 MED ORDER — SODIUM CHLORIDE 0.9 % IV BOLUS (SEPSIS)
1000.0000 mL | Freq: Once | INTRAVENOUS | Status: AC
  Administered 2016-06-05: 1000 mL via INTRAVENOUS

## 2016-06-05 MED ORDER — PIPERACILLIN-TAZOBACTAM 3.375 G IVPB: 3.3750 g | Freq: Three times a day (TID) | INTRAVENOUS | Status: DC

## 2016-06-05 MED ORDER — TAMSULOSIN HCL 0.4 MG PO CAPS
0.4000 mg | ORAL_CAPSULE | Freq: Every day | ORAL | Status: DC
  Administered 2016-06-05 – 2016-06-07 (×3): 0.4 mg via ORAL
  Filled 2016-06-05 (×3): qty 1

## 2016-06-05 MED ORDER — OSELTAMIVIR PHOSPHATE 75 MG PO CAPS
75.0000 mg | ORAL_CAPSULE | Freq: Once | ORAL | Status: AC
  Administered 2016-06-05: 75 mg via ORAL
  Filled 2016-06-05: qty 1

## 2016-06-05 MED ORDER — ONDANSETRON HCL 4 MG/2ML IJ SOLN: 4.0000 mg | Freq: Four times a day (QID) | INTRAMUSCULAR | Status: DC | PRN

## 2016-06-05 MED ORDER — HEPARIN SODIUM (PORCINE) 5000 UNIT/ML IJ SOLN
5000.0000 [IU] | Freq: Three times a day (TID) | INTRAMUSCULAR | Status: DC
  Administered 2016-06-05 – 2016-06-07 (×5): 5000 [IU] via SUBCUTANEOUS
  Filled 2016-06-05 (×6): qty 1

## 2016-06-05 MED ORDER — SODIUM CHLORIDE 0.9% FLUSH
3.0000 mL | Freq: Two times a day (BID) | INTRAVENOUS | Status: DC
  Administered 2016-06-05 – 2016-06-07 (×4): 3 mL via INTRAVENOUS

## 2016-06-05 MED ORDER — PIPERACILLIN-TAZOBACTAM 3.375 G IVPB 30 MIN
3.3750 g | Freq: Once | INTRAVENOUS | Status: AC
  Administered 2016-06-05: 3.375 g via INTRAVENOUS
  Filled 2016-06-05: qty 50

## 2016-06-05 MED ORDER — SODIUM CHLORIDE 0.9 % IV BOLUS (SEPSIS)
500.0000 mL | Freq: Once | INTRAVENOUS | Status: AC
  Administered 2016-06-05: 500 mL via INTRAVENOUS

## 2016-06-05 MED ORDER — SODIUM CHLORIDE 0.9 % IV SOLN: INTRAVENOUS | Status: DC

## 2016-06-05 MED ORDER — SODIUM CHLORIDE 0.9% FLUSH
3.0000 mL | Freq: Two times a day (BID) | INTRAVENOUS | Status: DC
  Administered 2016-06-05 – 2016-06-06 (×2): 3 mL via INTRAVENOUS

## 2016-06-05 MED ORDER — VANCOMYCIN HCL IN DEXTROSE 1-5 GM/200ML-% IV SOLN
1000.0000 mg | Freq: Once | INTRAVENOUS | Status: AC
  Administered 2016-06-05: 1000 mg via INTRAVENOUS
  Filled 2016-06-05: qty 200

## 2016-06-05 MED ORDER — HEPARIN SODIUM (PORCINE) 5000 UNIT/ML IJ SOLN: 5000.0000 [IU] | Freq: Three times a day (TID) | INTRAMUSCULAR | Status: DC

## 2016-06-05 MED ORDER — SODIUM CHLORIDE 0.9 % IV SOLN
INTRAVENOUS | Status: DC
  Administered 2016-06-05 – 2016-06-07 (×3): via INTRAVENOUS

## 2016-06-05 MED ORDER — ACETAMINOPHEN 650 MG RE SUPP: 650.0000 mg | Freq: Four times a day (QID) | RECTAL | Status: DC | PRN

## 2016-06-05 MED ORDER — ONDANSETRON HCL 4 MG PO TABS: 4.0000 mg | ORAL_TABLET | Freq: Four times a day (QID) | ORAL | Status: DC | PRN

## 2016-06-05 MED ORDER — FLECAINIDE ACETATE 50 MG PO TABS
75.0000 mg | ORAL_TABLET | Freq: Two times a day (BID) | ORAL | Status: DC
  Administered 2016-06-05 – 2016-06-07 (×4): 75 mg via ORAL
  Filled 2016-06-05 (×5): qty 2

## 2016-06-05 MED ORDER — ENSURE ENLIVE PO LIQD
237.0000 mL | Freq: Two times a day (BID) | ORAL | Status: DC
  Administered 2016-06-06 – 2016-06-07 (×3): 237 mL via ORAL
  Filled 2016-06-05: qty 237

## 2016-06-05 MED ORDER — METOPROLOL SUCCINATE ER 25 MG PO TB24
25.0000 mg | ORAL_TABLET | Freq: Every day | ORAL | Status: DC
  Administered 2016-06-05 – 2016-06-07 (×3): 25 mg via ORAL
  Filled 2016-06-05 (×3): qty 1

## 2016-06-05 MED ORDER — OXYCODONE HCL 5 MG PO TABS: 5.0000 mg | ORAL_TABLET | ORAL | Status: DC | PRN

## 2016-06-05 MED ORDER — ACETAMINOPHEN 325 MG PO TABS: 650.0000 mg | ORAL_TABLET | Freq: Four times a day (QID) | ORAL | Status: DC | PRN

## 2016-06-05 MED ORDER — VANCOMYCIN HCL IN DEXTROSE 750-5 MG/150ML-% IV SOLN
750.0000 mg | Freq: Two times a day (BID) | INTRAVENOUS | Status: DC
  Filled 2016-06-05: qty 150

## 2016-06-05 NOTE — ED Notes (Signed)
[employer] pacemaker interrogated at this time.

## 2016-06-05 NOTE — ED Notes (Signed)
Paged [facility] 

## 2016-06-05 NOTE — ED Notes (Signed)
Contacted pharmacy at this time to verify medications.

## 2016-06-05 NOTE — ED Notes (Signed)
Patient transported to X-ray 

## 2016-06-05 NOTE — ED Notes (Signed)
Attempted report 

## 2016-06-05 NOTE — ED Provider Notes (Signed)
[facility] DEPT Provider Note   CSN: [id number] Arrival date & time: 06/05/16  [id number]     History   Chief Complaint Chief Complaint  Patient presents with  . Fall    HPI Jonathon [name] is a 81 y.o. male.  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 Patient was found on the ground in the bathroom today. Patient remembers being in the bathroom prior to falling. He is unsure if this was this morning. He woke when his daughter found him laying on the floor. He did not remember the fall and is unsure how long he was there. Daughter was concerned that he may have been there for about a day because she could not get a hold of him yesterday. However patient states he just did not feel like talking. Per EMS, patient was confused on scene but then became more awake and alert. He had defecated on himself. Normal mentation upon arrival to the emergency department. Patient denies headache or neck pain, chest pain or shortness of breath. He denies other current medical complaints. Onset of symptoms acute. Course is improving. Nothing makes symptoms better or worse.  Similar presentation leading to admit 07/2015. Questionable seizures at that time. EEG performed as inpatient was normal.        Past Medical History:  Diagnosis Date  . Asthma   . BENIGN PROSTATIC HYPERTROPHY, HX OF 10/25/2008  . BRADYCARDIA 2005  . HYPERTENSION 11/21/2006  . Iron deficiency anemia, unspecified 04/19/2013  . 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 PACEMAKER, PERMANENT 2005   Gen change 2014 Medtronic Adaptic L dual-chamber pacemaker, serial [id number]   . TOBACCO ABUSE 10/25/2008   Quit 2012    Patient Active Problem List   Diagnosis Date Noted  . Orthostatic syncope   . Elevated liver function tests   . LOC (loss of consciousness) ([place]) 08/10/2014  . Syncope  08/10/2014  . CIDP (chronic inflammatory demyelinating polyneuropathy) ([place]) 08/08/2014  . Sustained SVT ([place]) 07/27/2014  . SOB (shortness of breath) 09/22/2013  . 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 Abnormal LFTs 08/06/2013  . Iron deficiency anemia 04/19/2013  . Muscle weakness 01/13/2013  . External hemorrhoids 03/23/2011  . BRADYCARDIA 10/25/2008  . 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 BENIGN PROSTATIC HYPERTROPHY, HX OF 10/25/2008  . PACEMAKER, PERMANENT 10/25/2008  . Essential hypertension 11/21/2006  . BENIGN PROSTATIC HYPERTROPHY 11/21/2006  . History of colonic polyps 11/21/2006  . NEPHROLITHIASIS, HX OF 11/21/2006    Past Surgical History:  Procedure Laterality Date  . COLONOSCOPY  [id number]  . PACEMAKER INSERTION  2005  . PERMANENT PACEMAKER GENERATOR CHANGE N/A 06/19/2012   Procedure: PERMANENT PACEMAKER GENERATOR CHANGE;  Surgeon: [name], MD; Medtronic Adaptic L dual-chamber pacemaker, serial [id number]    . SKIN GRAFT Right 1962   wrist       Home Medications    Prior to Admission medications   Medication Sig Start Date End Date Taking? Authorizing Provider  b complex vitamins tablet Take 1 tablet by mouth daily as needed (for supplement).    Yes Historical Provider, MD  Cyanocobalamin (VITAMIN B 12 PO) Take 1,000 mg by mouth daily as needed (for supplement).    Yes Historical Provider, MD  flecainide (TAMBOCOR) 150 MG tablet Take 0.5 tablets (75 mg total) by mouth 2 (two) times daily. 01/25/16  Yes Sherran Needs, NP  Garlic [id number] MG CAPS Take 2,000 mg  by mouth daily as needed (supplement).    Yes Historical Provider, MD  irbesartan (AVAPRO) 300 MG tablet Take 1 tablet (300 mg total) by mouth daily. 12/08/15  Yes [name], MD  metoprolol succinate (TOPROL XL) 25 MG 24 hr tablet Take 1 tablet (25 mg total) by mouth daily. 01/25/16  Yes Sherran Needs, NP  Omega-3 Fatty Acids (FISH OIL) 1000 MG CAPS Take 1 capsule by mouth daily as needed (for  supplement).    Yes Historical Provider, MD  tamsulosin (FLOMAX) 0.4 MG CAPS capsule TAKE 1 CAPSULE BY MOUTH EVERY DAY 09/19/15  Yes Marletta Lor, MD  Vitamin D, Ergocalciferol, (DRISDOL) 50000 units CAPS capsule Take 1 capsule (50,000 Units total) by mouth every 7 (seven) days. 09/21/15  Yes Lyndal Pulley, DO  gabapentin (NEURONTIN) 100 MG capsule Take 2 capsules (200 mg total) by mouth at bedtime. Patient not taking: Reported on 06/05/2016 09/21/15   Lyndal Pulley, DO    Family History Family History  Problem Relation Age of Onset  . Heart attack Father     Died, 68  . Kidney disease Father   . Parkinson's disease Mother     Died, 60  . Breast cancer Sister     Living, 1  . Diabetes type II Brother     Living, 77  . Breast cancer Sister     Living, 70  . Diabetes Daughter     Living, 79  . Diabetes Son     Living, 10  . Ovarian cancer Daughter   . Colon cancer Neg Hx   . Rectal cancer Neg Hx   . Stomach cancer Neg Hx     Social History Social History  Substance Use Topics  . Smoking status: Former Smoker    Packs/day: 0.25    Years: 46.00    Types: Cigarettes    Start date: 03/16/1965    Quit date: 06/15/2010  . Smokeless tobacco: Never Used     Comment: quit 3 years ago  . Alcohol use No     Comment: 4 beers per day     Allergies   Patient has no known allergies.   Review of Systems Review of Systems  Constitutional: Negative for diaphoresis and fever.  Eyes: Negative for redness.  Respiratory: Negative for cough and shortness of breath.   Cardiovascular: Negative for chest pain, palpitations and leg swelling.  Gastrointestinal: Negative for abdominal pain, nausea and vomiting.  Genitourinary: Negative for dysuria.  Musculoskeletal: Negative for back pain and neck pain.  Skin: Negative for rash.  Neurological: Positive for syncope. Negative for light-headedness.       Worsening coordination  Psychiatric/Behavioral: Positive for confusion. Negative  for decreased concentration. The patient is not nervous/anxious.      Physical Exam Updated Vital Signs BP 147/100   Pulse 114   Temp 101 F (38.3 C) (Rectal)   Resp (!) 29   Ht 5\' 11"  (1.803 m)   Wt 77.1 kg   SpO2 100%   BMI 23.71 kg/m   Physical Exam  Constitutional: He is oriented to person, place, and time. He appears well-developed and well-nourished.  HENT:  Head: Normocephalic and atraumatic.  Right Ear: Tympanic membrane, external ear and ear canal normal.  Left Ear: Tympanic membrane, external ear and ear canal normal.  Nose: Nose normal. No mucosal edema or rhinorrhea.  Mouth/Throat: Uvula is midline, oropharynx is clear and moist and mucous membranes are normal. Mucous membranes are not dry. No  trismus in the jaw. No uvula swelling. No oropharyngeal exudate, posterior oropharyngeal edema, posterior oropharyngeal erythema or tonsillar abscesses.  Eyes: Conjunctivae, EOM and lids are normal. Pupils are equal, round, and reactive to light. Right eye exhibits no discharge. Left eye exhibits no discharge.  Neck: Trachea normal and normal range of motion. Neck supple. Normal carotid pulses and no JVD present. No muscular tenderness present. Carotid bruit is not present. No tracheal deviation present.  Cardiovascular: Normal rate, regular rhythm, S1 normal, S2 normal, normal heart sounds and intact distal pulses.  Exam reveals no distant heart sounds and no decreased pulses.   No murmur heard. Pulmonary/Chest: Effort normal and breath sounds normal. No respiratory distress. He has no wheezes. He has no rales. He exhibits no tenderness.  Abdominal: Soft. Normal aorta and bowel sounds are normal. There is no tenderness. There is no rebound and no guarding.  Genitourinary: Testes normal and penis normal. Rectal exam shows external hemorrhoid.  Musculoskeletal: Normal range of motion. He exhibits no edema.       Cervical back: He exhibits normal range of motion, no tenderness and no  bony tenderness.  Full active ROM arms, legs, neck without pain.   Neurological: He is alert and oriented to person, place, and time. He has normal reflexes. No cranial nerve deficit or sensory deficit. He exhibits normal muscle tone. He displays a negative Romberg sign. Coordination abnormal. GCS eye subscore is 4. GCS verbal subscore is 5. GCS motor subscore is 6.  Patient with trace decreased strength bilateral lower extremities which she says is chronic. Well-healed edition. Difficulty touching finger to nose with eyes closed. Finger to nose test is normal.  Skin: Skin is warm and dry. He is not diaphoretic. No cyanosis. No pallor.  Small abrasion over sacrum without tenderness. Some redness on back without disruption to skin.   Psychiatric: He has a normal mood and affect.  Nursing note and vitals reviewed.    ED Treatments / Results  Labs (all labs ordered are listed, but only abnormal results are displayed) Labs Reviewed  CBC WITH DIFFERENTIAL/PLATELET - Abnormal; Notable for the following:       Result Value   Hemoglobin 12.8 (*)    HCT 36.3 (*)    All other components within normal limits  URINALYSIS, ROUTINE W REFLEX MICROSCOPIC - Abnormal; Notable for the following:    Color, Urine AMBER (*)    APPearance HAZY (*)    Hgb urine dipstick SMALL (*)    Ketones, ur 5 (*)    Protein, ur 30 (*)    Bacteria, UA RARE (*)    Squamous Epithelial / LPF 0-5 (*)    All other components within normal limits  COMPREHENSIVE METABOLIC PANEL - Abnormal; Notable for the following:    Sodium 132 (*)    Chloride 98 (*)    CO2 21 (*)    Glucose, Bld 106 (*)    Creatinine, Ser 1.35 (*)    Calcium 8.5 (*)    Albumin 2.7 (*)    AST 285 (*)    ALT 99 (*)    Alkaline Phosphatase 136 (*)    Total Bilirubin 2.7 (*)    GFR calc non Af Amer 49 (*)    GFR calc Af Amer 56 (*)    All other components within normal limits  I-STAT CG4 LACTIC ACID, ED - Abnormal; Notable for the following:     Lactic Acid, Venous 2.36 (*)    All other components within  normal limits  CULTURE, BLOOD (ROUTINE X 2)  CULTURE, BLOOD (ROUTINE X 2)  URINE CULTURE  LACTIC ACID, PLASMA  INFLUENZA PANEL BY PCR (TYPE A & B)  CK  I-STAT TROPOININ, ED    EKG  EKG Interpretation  Date/Time:  Wednesday June 05 2016 09:24:09 EST Ventricular Rate:  119 PR Interval:    QRS Duration: 123 QT Interval:  325 QTC Calculation: 458 R Axis:   155 Text Interpretation:  Sinus tachycardia RBBB and LPFB ST elevation, consider inferior injury Since prior ECG, rate has increased, no other significant changes Confirmed by [facility] MD, ERIN ([id number]) on 06/05/2016 9:55:35 AM       Radiology Dg Chest 2 View  Result Date: 06/05/2016 CLINICAL DATA:  Fall.  Altered level of consciousness. EXAM: CHEST  2 VIEW COMPARISON:  05/04/2016 FINDINGS: Left pacer is in place with leads in the right atrium and right ventricle, unchanged. Heart and mediastinal contours are within normal limits. No focal opacities or effusions. No acute bony abnormality. IMPRESSION: No active cardiopulmonary disease. Electronically Signed   By: Rolm Baptise M.D.   On: 06/05/2016 10:00   Ct Head Wo Contrast  Result Date: 06/05/2016 CLINICAL DATA:  Positive loss of consciousness after fall at home. EXAM: CT HEAD WITHOUT CONTRAST TECHNIQUE: Contiguous axial images were obtained from the base of the skull through the vertex without intravenous contrast. COMPARISON:  CT scan of August 10, 2014. FINDINGS: Brain: Mild diffuse cortical atrophy is noted. Mild chronic ischemic white matter disease is noted. No mass effect or midline shift is noted. Ventricular size is within normal limits. There is no evidence of mass lesion, hemorrhage or acute infarction. Vascular: Atherosclerosis of carotid siphons is noted. Skull: Normal. Negative for fracture or focal lesion. Sinuses/Orbits: No acute finding. Other: None peer IMPRESSION: Mild diffuse cortical atrophy. Mild  chronic ischemic white matter disease. No acute intracranial abnormality seen. Electronically Signed   By: Marijo Conception, M.D.   On: 06/05/2016 10:22    Procedures Procedures (including critical care time)  Medications Ordered in ED Medications  0.9 %  sodium chloride infusion (not administered)     Initial Impression / Assessment and Plan / ED Course  I have reviewed the triage vital signs and the nursing notes.  Pertinent labs & imaging results that were available during my care of the patient were reviewed by me and considered in my medical decision making (see chart for details).     Patient seen and examined. Work-up initiated.    Vital signs reviewed and are as follows: BP 147/100   Pulse 114   Temp 101 F (38.3 C) (Rectal)   Resp (!) 29   Ht 5\' 11"  (1.803 m)   Wt 77.1 kg   SpO2 100%   BMI 23.71 kg/m   10:53 AM Pt d/w and seen by Dr. Billy Fischer who has ordered fluids, IV abx given fever, tachycardia. Unclear source at this time.   Pacer has been interrogated. Patient had elevated atrial rate 140-160 around the time he was found down this AM.   UA is clear. Spoke with Dr. Marily Memos who will see and admit. Pt and daughter at bedside have been updated on plan and agree.   Final Clinical Impressions(s) / ED Diagnoses   Final diagnoses:  Fall, initial encounter  Loss of consciousness ([place])  Fever, unspecified fever cause   Admit.   New Prescriptions New Prescriptions   No medications on file     Carlisle Cater, [place] 06/05/16  [place], MD 06/11/16 1651

## 2016-06-05 NOTE — Progress Notes (Signed)
Pharmacy Antibiotic Note  Jonathon [name] is a 81 y.o. male admitted on 06/05/2016 with sepsis.  Pharmacy has been consulted for vancomycin and zosyn dosing. Wt 77 kg. WBC WNL, lactate 2.36> 1.35 , temp 101, tachycardia HR 140-160. CXR neg, head CT neg. Pt from home via [facility] for fall and LOC, found lying in the floor - unsure how long. Creat 1.35, normalized creat cl 46 ml/min  Plan: vancomycin 1 gm loading dose followed by vancomycin 750 mg IV q12 hrs for vancomycin trough goal 15 - 20 mcg/ml Zosyn 3.375g IV q8h (4 hour infusion).  F/u renal fxn, wbc, temp, culture data Vancomycin levels as needed  Height: 5\' 11"  (180.3 cm) Weight: 170 lb (77.1 kg) IBW/kg (Calculated) : 75.3  Temp (24hrs), Avg:100.3 F (37.9 C), Min:99.6 F (37.6 C), Max:101 F (38.3 C)   Recent Labs Lab 06/05/16 1018 06/05/16 1031  WBC 6.3  --   LATICACIDVEN  --  2.36*    CrCl cannot be calculated (Patient's most recent lab result is older than the maximum 21 days allowed.).    No Known Allergies  Eudelia Bunch, Pharm.D. [id number] 06/05/2016 11:17 AM

## 2016-06-05 NOTE — H&P (Signed)
History and Physical    Jonathon [name] [id number] DOB: November 22, 1935 DOA: 06/05/2016  PCP: Nyoka Cowden, MD Patient coming from: home  Chief Complaint: found down  HPI: Jonathon [name] is a 81 y.o. male with medical history significant of asthma, BPH, bradycardia/SVT status post pacemaker placement, CIDP, nephrolithiasis, hypertension, presenting after being found down by his daughter. Of note patient has no recollection of how he ended up on the floor of the bathroom. No loss of bowel or bladder function, no tongue injury. Last reported communication with the patient was sometime in the afternoon the day prior to admission. Patient was still in his close from the previous day so the suspicion is that he fell some time during the evening and stayed on the floor in the bathroom throughout the night. EMS was called to evaluate patient and patient was noted be confused at time of evaluation. Patient was afebrile at that time and vital signs were otherwise stable. Patient states he has a nonproductive cough which is chronic and unchanged from baseline for him. Denies any other symptoms such as fever, chest pain, shortness of breath, palpitations, nausea, vomiting, diarrhea, neck stiffness, headache, dizzines, lower extremity swelling.   ED Course: Concern for sepsis. Broad-spectrum antibiotics started with fluid boluses. Objective findings outlined below.  Review of Systems: As per HPI otherwise 10 point review of systems negative.   Ambulatory Status: Shuffles but moves about freely.  Past Medical History:  Diagnosis Date  . Asthma   . BENIGN PROSTATIC HYPERTROPHY, HX OF 10/25/2008  . BRADYCARDIA 2005  . Chronic diastolic congestive heart failure ([place]) 06/05/2016  . CIDP (chronic inflammatory demyelinating polyneuropathy) ([place])   . HYPERTENSION 11/21/2006  . Iron deficiency anemia, unspecified 04/19/2013  . 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 PACEMAKER,  PERMANENT 2005   Gen change 2014 Medtronic Adaptic L dual-chamber pacemaker, serial [id number]   . SVT (supraventricular tachycardia) ([place])   . TOBACCO ABUSE 10/25/2008   Quit 2012    Past Surgical History:  Procedure Laterality Date  . COLONOSCOPY  [id number]  . PACEMAKER INSERTION  2005  . PERMANENT PACEMAKER GENERATOR CHANGE N/A 06/19/2012   Procedure: PERMANENT PACEMAKER GENERATOR CHANGE;  Surgeon: [name], MD; Medtronic Adaptic L dual-chamber pacemaker, serial [id number]    . SKIN GRAFT Right 1962   wrist    Social History   Social History  . Marital status: Divorced    Spouse name: N/A  . Number of children: 4  . Years of education: N/A   Occupational History  . [employer]   Social History Main Topics  . Smoking status: Former Smoker    Packs/day: 0.25    Years: 46.00    Types: Cigarettes    Start date: 03/16/1965    Quit date: 06/15/2010  . Smokeless tobacco: Never Used     Comment: quit 3 years ago  . Alcohol use No     Comment: 4 beers per day  . Drug use: No  . Sexual activity: Not on file   Other Topics Concern  . 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    No Known Allergies  Family History  Problem Relation Age of Onset  . Heart attack Father     Died, 28  . Kidney disease Father   . Parkinson's disease Mother     Died,  52  . Breast cancer Sister     Living, 60  . Diabetes type II Brother     Living, 90  . Breast cancer Sister     Living, 55  . Diabetes Daughter     Living, 77  . Diabetes Son     Living, 98  . Ovarian cancer Daughter   . Colon cancer Neg Hx   . Rectal cancer Neg Hx   . Stomach cancer Neg Hx     Prior to Admission medications   Medication Sig Start Date End Date Taking? Authorizing Provider  b complex vitamins tablet Take 1 tablet by mouth daily as needed (for supplement).    Yes Historical  Provider, MD  Cyanocobalamin (VITAMIN B 12 PO) Take 1,000 mg by mouth daily as needed (for supplement).    Yes Historical Provider, MD  flecainide (TAMBOCOR) 150 MG tablet Take 0.5 tablets (75 mg total) by mouth 2 (two) times daily. 01/25/16  Yes Sherran Needs, NP  Garlic [id number] MG CAPS Take 2,000 mg by mouth daily as needed (supplement).    Yes Historical Provider, MD  irbesartan (AVAPRO) 300 MG tablet Take 1 tablet (300 mg total) by mouth daily. 12/08/15  Yes [name], MD  metoprolol succinate (TOPROL XL) 25 MG 24 hr tablet Take 1 tablet (25 mg total) by mouth daily. 01/25/16  Yes Sherran Needs, NP  Omega-3 Fatty Acids (FISH OIL) 1000 MG CAPS Take 1 capsule by mouth daily as needed (for supplement).    Yes Historical Provider, MD  tamsulosin (FLOMAX) 0.4 MG CAPS capsule TAKE 1 CAPSULE BY MOUTH EVERY DAY 09/19/15  Yes Marletta Lor, MD  Vitamin D, Ergocalciferol, (DRISDOL) 50000 units CAPS capsule Take 1 capsule (50,000 Units total) by mouth every 7 (seven) days. 09/21/15  Yes Lyndal Pulley, DO  gabapentin (NEURONTIN) 100 MG capsule Take 2 capsules (200 mg total) by mouth at bedtime. Patient not taking: Reported on 06/05/2016 09/21/15   Lyndal Pulley, DO    Physical Exam: Vitals:   06/05/16 1600 06/05/16 1609 06/05/16 1630 06/05/16 1700  BP: 133/85  127/86 116/85  Pulse: 96  98 95  Resp: 21  22 22   Temp:  98.9 F (37.2 C)    TempSrc:  Oral    SpO2: 95%  98% 97%  Weight:      Height:         General:  Appears calm and comfortable Eyes:  PERRL, EOMI, normal lids, iris ENT:  grossly normal hearing, lips & tongue, mmm Neck:  no LAD, masses or thyromegaly Cardiovascular:  RRR, II/VI systolic murmur. No LE edema.  Respiratory:  CTA bilaterally, no w/r/r. Normal respiratory effort. Abdomen:  soft, ntnd, NABS Skin:  no rash or induration seen on limited exam Musculoskeletal:  grossly normal tone BUE/BLE, good ROM, no bony abnormality Psychiatric:  grossly normal mood and  affect, speech fluent and appropriate, AOx3 Neurologic:  CN 2-12 grossly intact, moves all extremities in coordinated fashion, sensation intact  Labs on Admission: I have personally reviewed following labs and imaging studies  CBC:  Recent Labs Lab 06/05/16 1018  WBC 6.3  NEUTROABS 4.7  HGB 12.8*  HCT 36.3*  MCV 85.4  PLT 0000000   Basic Metabolic Panel:  Recent Labs Lab 06/05/16 1102  NA 132*  K 5.0  CL 98*  CO2 21*  GLUCOSE 106*  BUN 19  CREATININE 1.35*  CALCIUM 8.5*   GFR: Estimated Creatinine Clearance: 48 mL/min (by C-G  formula based on SCr of 1.35 mg/dL (H)). Liver Function Tests:  Recent Labs Lab 06/05/16 1102  AST 285*  ALT 99*  ALKPHOS 136*  BILITOT 2.7*  PROT 7.2  ALBUMIN 2.7*   No results for input(s): LIPASE, AMYLASE in the last 168 hours. No results for input(s): AMMONIA in the last 168 hours. Coagulation Profile: No results for input(s): INR, PROTIME in the last 168 hours. Cardiac Enzymes:  Recent Labs Lab 06/05/16 1623  CKTOTAL 3,535*   BNP (last 3 results) No results for input(s): PROBNP in the last 8760 hours. HbA1C: No results for input(s): HGBA1C in the last 72 hours. CBG: No results for input(s): GLUCAP in the last 168 hours. Lipid Profile: No results for input(s): CHOL, HDL, LDLCALC, TRIG, CHOLHDL, LDLDIRECT in the last 72 hours. Thyroid Function Tests: No results for input(s): TSH, T4TOTAL, FREET4, T3FREE, THYROIDAB in the last 72 hours. Anemia Panel: No results for input(s): VITAMINB12, FOLATE, FERRITIN, TIBC, IRON, RETICCTPCT in the last 72 hours. Urine analysis:    Component Value Date/Time   COLORURINE AMBER (A) 06/05/2016 1355   APPEARANCEUR HAZY (A) 06/05/2016 1355   LABSPEC 1.021 06/05/2016 1355   PHURINE 6.0 06/05/2016 1355   GLUCOSEU NEGATIVE 06/05/2016 1355   HGBUR SMALL (A) 06/05/2016 1355   BILIRUBINUR NEGATIVE 06/05/2016 1355   KETONESUR 5 (A) 06/05/2016 1355   PROTEINUR 30 (A) 06/05/2016 1355    UROBILINOGEN 1.0 08/10/2014 1653   NITRITE NEGATIVE 06/05/2016 1355   LEUKOCYTESUR NEGATIVE 06/05/2016 1355    Creatinine Clearance: Estimated Creatinine Clearance: 48 mL/min (by C-G formula based on SCr of 1.35 mg/dL (H)).  Sepsis Labs: @LABRCNTIP (procalcitonin:4,lacticidven:4) )No results found for this or any previous visit (from the past 240 hour(s)).   Radiological Exams on Admission: Dg Chest 2 View  Result Date: 06/05/2016 CLINICAL DATA:  Fall.  Altered level of consciousness. EXAM: CHEST  2 VIEW COMPARISON:  05/04/2016 FINDINGS: Left pacer is in place with leads in the right atrium and right ventricle, unchanged. Heart and mediastinal contours are within normal limits. No focal opacities or effusions. No acute bony abnormality. IMPRESSION: No active cardiopulmonary disease. Electronically Signed   By: Rolm Baptise M.D.   On: 06/05/2016 10:00   Ct Head Wo Contrast  Result Date: 06/05/2016 CLINICAL DATA:  Positive loss of consciousness after fall at home. EXAM: CT HEAD WITHOUT CONTRAST TECHNIQUE: Contiguous axial images were obtained from the base of the skull through the vertex without intravenous contrast. COMPARISON:  CT scan of August 10, 2014. FINDINGS: Brain: Mild diffuse cortical atrophy is noted. Mild chronic ischemic white matter disease is noted. No mass effect or midline shift is noted. Ventricular size is within normal limits. There is no evidence of mass lesion, hemorrhage or acute infarction. Vascular: Atherosclerosis of carotid siphons is noted. Skull: Normal. Negative for fracture or focal lesion. Sinuses/Orbits: No acute finding. Other: None peer IMPRESSION: Mild diffuse cortical atrophy. Mild chronic ischemic white matter disease. No acute intracranial abnormality seen. Electronically Signed   By: Marijo Conception, M.D.   On: 06/05/2016 10:22    EKG: Independently reviewed. RBBB, No ACS, sinus  Assessment/Plan Active Problems:   Essential hypertension   Symptomatic  bradycardia   PACEMAKER, PERMANENT   Sustained SVT (HCC)   CIDP (chronic inflammatory demyelinating polyneuropathy) (H[facility])   Syncope   FUO (fever of unknown origin)   Hepatic steatosis   Physical deconditioning   Chronic diastolic congestive heart failure ([facility])   Syncope: likely from cardiac etiology vs orthostasis or  vasovagal w/ possible viral/bacterial illness. Lactic acid likely elevated due to dehydration. Pt is a poor historian and w/o recollection of event. Multiple reported falls over the past month. Current episode left pt on floor throughout the night. Doubt seizure. Pacemaker interrogation w/o noted HR of 140-160 at time of EMS eval per EDP. CT head nml. - Tele - Pacemaker interrogation again - orthostatic VS (already w/ IVF boluses) - treatment of presumed infection as below - Echo - CK (concern for rhabdo)  FUO: Fever to 101. Tachycardia time of presentation with intermittent tachypnea. O2 saturations normal on room air. No reported symptoms at home to suggest infection. UA unremarkable. Basic chemistries unchanged from previous. CXR without evidence of acute process. Normal white blood cell count. Lactic acid 2.36 at time of admission. Patient treated per sepsis protocol and started on vancomycin and Zosyn and Tamiflu. Overall patient is well appearing. Influenza PCR negative. Patient's dry cough is chronic and at baseline. - Follow-up urine culture and blood cultures - Respiratory viral panel - Hold further antibiotics - Procalcitonin  SVT/Bradycardia: Condition well controlled after starting flecainide and having pacemaker placed.  - Tele - Continue metop  HTN: - continue metop, avapro  BPH:  - continue flomax  CKD: Creatinine 1.35. At baseline. - IVF, CMP in am  DCHF: EF 65% with grade 1 diastolic noted on previous echo. No evidence of decompensation. - Strict I's and O's, daily weights  Physical deconditioning: h/o CIDP. Pt endorses gradual worsening of  physical strength and poor oral intake - PT/OT/Nutrition - CSW for placement - pt amenable. May need rehab for a few weeks to get pts strength back.   Hepatic steatosis: Chronic. Alkaline phosphatase 136, AST 285, ALT 99, T bili 2.7, albumin 2.7 - CMP in am    DVT prophylaxis:  Hep  Code Status: FULL  Family Communication: wife  Disposition Plan: pending improvement, syncope eval, and PT eval. Suspect pt may need placement   Consults called: none  Admission status: inpt    MERRELL, DAVID J MD [facility]  If 7PM-7AM, please contact night-coverage [email or web address] Password [facility]  06/05/2016, 5:38 PM

## 2016-06-05 NOTE — ED Triage Notes (Signed)
Pt. Coming from home via [facility] for fall and ALOC. Pt. Daughter found him laying in the bathroom floor this morning. Pt. Initially did not know how he got there or remember the fall. Pt. Now sts he was in bed and wanted some oil for his back. Pt. Then got up to get oil from bathroom. Pt. sts he does not know how long he was laying there. Pt. Hx of pacemaker. Pt. Daughter wants him to go to a facility because he's fallen before. Pt. AOx4. EDP at bedside.

## 2016-06-06 ENCOUNTER — Inpatient Hospital Stay (HOSPITAL_COMMUNITY): Payer: Medicare Other

## 2016-06-06 DIAGNOSIS — G6181 Chronic inflammatory demyelinating polyneuritis: Secondary | ICD-10-CM

## 2016-06-06 DIAGNOSIS — Z950 Presence of cardiac pacemaker: Secondary | ICD-10-CM

## 2016-06-06 DIAGNOSIS — R509 Fever, unspecified: Secondary | ICD-10-CM

## 2016-06-06 DIAGNOSIS — I5032 Chronic diastolic (congestive) heart failure: Secondary | ICD-10-CM

## 2016-06-06 DIAGNOSIS — R5381 Other malaise: Secondary | ICD-10-CM

## 2016-06-06 DIAGNOSIS — K760 Fatty (change of) liver, not elsewhere classified: Secondary | ICD-10-CM

## 2016-06-06 LAB — COMPREHENSIVE METABOLIC PANEL
ALK PHOS: 100 U/L (ref 38–126)
ALT: 76 U/L — ABNORMAL HIGH (ref 17–63)
ANION GAP: 7 (ref 5–15)
AST: 193 U/L — ABNORMAL HIGH (ref 15–41)
Albumin: 2.2 g/dL — ABNORMAL LOW (ref 3.5–5.0)
BUN: 19 mg/dL (ref 6–20)
CALCIUM: 8.1 mg/dL — AB (ref 8.9–10.3)
CHLORIDE: 104 mmol/L (ref 101–111)
CO2: 24 mmol/L (ref 22–32)
CREATININE: 1.04 mg/dL (ref 0.61–1.24)
Glucose, Bld: 99 mg/dL (ref 65–99)
Potassium: 4.8 mmol/L (ref 3.5–5.1)
Sodium: 135 mmol/L (ref 135–145)
Total Bilirubin: 2.2 mg/dL — ABNORMAL HIGH (ref 0.3–1.2)
Total Protein: 6.1 g/dL — ABNORMAL LOW (ref 6.5–8.1)

## 2016-06-06 LAB — RESPIRATORY PANEL BY PCR
Adenovirus: NOT DETECTED
BORDETELLA PERTUSSIS-RVPCR: NOT DETECTED
CORONAVIRUS HKU1-RVPPCR: NOT DETECTED
CORONAVIRUS NL63-RVPPCR: NOT DETECTED
CORONAVIRUS OC43-RVPPCR: NOT DETECTED
Chlamydophila pneumoniae: NOT DETECTED
Coronavirus 229E: NOT DETECTED
INFLUENZA A-RVPPCR: NOT DETECTED
Influenza B: NOT DETECTED
MYCOPLASMA PNEUMONIAE-RVPPCR: NOT DETECTED
Metapneumovirus: NOT DETECTED
Parainfluenza Virus 1: NOT DETECTED
Parainfluenza Virus 2: NOT DETECTED
Parainfluenza Virus 3: NOT DETECTED
Parainfluenza Virus 4: NOT DETECTED
RESPIRATORY SYNCYTIAL VIRUS-RVPPCR: NOT DETECTED
RHINOVIRUS / ENTEROVIRUS - RVPPCR: NOT DETECTED

## 2016-06-06 LAB — CBC
HCT: 31.4 % — ABNORMAL LOW (ref 39.0–52.0)
Hemoglobin: 10.6 g/dL — ABNORMAL LOW (ref 13.0–17.0)
MCH: 29.3 pg (ref 26.0–34.0)
MCHC: 33.8 g/dL (ref 30.0–36.0)
MCV: 86.7 fL (ref 78.0–100.0)
PLATELETS: 125 10*3/uL — AB (ref 150–400)
RBC: 3.62 MIL/uL — AB (ref 4.22–5.81)
RDW: 14.1 % (ref 11.5–15.5)
WBC: 4.8 10*3/uL (ref 4.0–10.5)

## 2016-06-06 LAB — URINE CULTURE: Culture: NO GROWTH

## 2016-06-06 LAB — ECHOCARDIOGRAM COMPLETE
HEIGHTINCHES: 71 in
WEIGHTICAEL: 2720 [oz_av]

## 2016-06-06 LAB — GLUCOSE, CAPILLARY: Glucose-Capillary: 103 mg/dL — ABNORMAL HIGH (ref 65–99)

## 2016-06-06 MED ORDER — ADULT MULTIVITAMIN W/MINERALS CH
1.0000 | ORAL_TABLET | Freq: Every day | ORAL | Status: DC
  Administered 2016-06-06 – 2016-06-07 (×2): 1 via ORAL
  Filled 2016-06-06 (×2): qty 1

## 2016-06-06 NOTE — Evaluation (Signed)
Occupational Therapy Evaluation Patient Details Name: Jonathon [name] MRN: [id number] DOB: 25-Feb-1936 Today's Date: 06/06/2016    History of Present Illness  81 y.o. male with medical history significant of asthma, BPH, bradycardia/SVT status post pacemaker placement, CIDP, nephrolithiasis, hypertension, presenting after being found down by his daughter.   Clinical Impression   Pt reports he was managing BADL at mod I level PTA. Currently pt requires min assist for functional mobility and min guard for ADL. Pt presenting with generalized weakness in bil LEs and poor standing balance impacting his independence and safety with ADL and functional mobility. Pt planning to d/c home with 24/7 supervision initially from family with plans for transition to ALF (pt would appreciate guidance with transition to ALF from CM/CSW). Recommending HHOT for follow up to maximize independence and safety with ADL and functional mobility upon return home. Pt would benefit from continued skilled OT to address established goals.    Follow Up Recommendations  Home health OT;Supervision/Assistance - 24 hour (with transition to ALF)    Equipment Recommendations  None recommended by OT    Recommendations for Other Services PT consult     Precautions / Restrictions Precautions Precautions: Fall Restrictions Weight Bearing Restrictions: No      Mobility Bed Mobility Overal bed mobility: Needs Assistance Bed Mobility: Supine to Sit     Supine to sit: Supervision;HOB elevated     General bed mobility comments: Supervision for safety; no physical assist required. HOB elevated without use of bed rail.  Transfers Overall transfer level: Needs assistance Equipment used: Rolling walker (2 wheeled) Transfers: Sit to/from Stand Sit to Stand: Min guard         General transfer comment: Min guard for safety. Cues for hand placement.    Balance Overall balance assessment: Needs assistance;History of  Falls Sitting-balance support: Feet supported;No upper extremity supported Sitting balance-Leahy Scale: Good     Standing balance support: Bilateral upper extremity supported Standing balance-Leahy Scale: Poor Standing balance comment: RW for support                            ADL Overall ADL's : Needs assistance/impaired Eating/Feeding: Modified independent;Sitting   Grooming: Min guard;Standing   Upper Body Bathing: Set up;Supervision/ safety;Sitting   Lower Body Bathing: Min guard;Sit to/from stand   Upper Body Dressing : Set up;Supervision/safety;Sitting   Lower Body Dressing: Min guard;Sit to/from stand   Toilet Transfer: Minimal assistance;Ambulation;RW Toilet Transfer Details (indicate cue type and reason): Simulated by sit to stand from EOB with functional mobility in room         Functional mobility during ADLs: Minimal assistance;Rolling walker General ADL Comments: SpO2 >96% on RA throughout session. Pt reports no dizziness, just bil LE weakness.     Vision     Perception     Praxis      Pertinent Vitals/Pain Pain Assessment: No/denies pain     Hand Dominance     Extremity/Trunk Assessment Upper Extremity Assessment Upper Extremity Assessment: Overall WFL for tasks assessed   Lower Extremity Assessment Lower Extremity Assessment: Defer to PT evaluation   Cervical / Trunk Assessment Cervical / Trunk Assessment: Normal   Communication Communication Communication: No difficulties   Cognition Arousal/Alertness: Awake/alert Behavior During Therapy: WFL for tasks assessed/performed Overall Cognitive Status: Within Functional Limits for tasks assessed                     General Comments  Exercises       Shoulder Instructions      Home Living Family/patient expects to be discharged to:: Private residence Living Arrangements: Alone Available Help at Discharge: Family;Available 24 hours/day (son and daughter  planning to stay with pt 24/7) Type of Home: House Home Access: Stairs to enter [employer] of Steps: 4 Entrance Stairs-Rails: Left;Right Home Layout: One level     Bathroom Shower/Tub: Tub/shower unit;Curtain Shower/tub characteristics: Architectural technologist: Handicapped height     Home Equipment: Environmental consultant - 4 wheels;Cane - single point;Tub bench;Hand held shower head   Additional Comments: Pt reports he plans to get set up at ALF upon d/c; he has already discussed with family.      Prior Functioning/Environment Level of Independence: Independent with assistive device(s)        Comments: RW for mobility. Daughter drives pt to grocery store. Pt independent with BADL, recently has only been sponge bathing.        OT Problem List: Decreased strength;Impaired balance (sitting and/or standing);Decreased safety awareness;Decreased knowledge of use of DME or AE;Decreased knowledge of precautions   OT Treatment/Interventions: Self-care/ADL training;Therapeutic exercise;DME and/or AE instruction;Energy conservation;Therapeutic activities;Patient/family education;Balance training    OT Goals(Current goals can be found in the care plan section) Acute Rehab OT Goals Patient Stated Goal: home then to ALF OT Goal Formulation: With patient Time For Goal Achievement: 06/20/16 Potential to Achieve Goals: Good ADL Goals Pt Will Perform Grooming: with modified independence;standing Pt Will Perform Upper Body Bathing: with modified independence;sitting Pt Will Perform Lower Body Bathing: with modified independence;sit to/from stand Pt Will Perform Upper Body Dressing: with modified independence;sitting Pt Will Perform Lower Body Dressing: with modified independence;sit to/from stand Pt Will Transfer to Toilet: with modified independence;ambulating;regular height toilet Pt Will Perform Toileting - Clothing Manipulation and hygiene: with modified independence;sit to/from stand Pt  Will Perform Tub/Shower Transfer: Tub transfer;with modified independence;ambulating;tub bench;rolling walker  OT Frequency: Min 2X/week   Barriers to D/C:            Co-evaluation              End of Session Equipment Utilized During Treatment: Gait belt;Rolling walker Nurse Communication: Mobility status  Activity Tolerance: Patient tolerated treatment well Patient left: in chair;with call bell/phone within reach;with chair alarm set   Time: [id number] OT Time Calculation (min): 20 min Charges:  OT General Charges $OT Visit: 1 Procedure OT Evaluation $OT Eval Moderate Complexity: 1 Procedure G-Codes:     Binnie Kand M.S., OTR/L Pager: [phone]  06/06/2016, 8:51 AM

## 2016-06-06 NOTE — Progress Notes (Signed)
EEG Completed; Results Pending  

## 2016-06-06 NOTE — Evaluation (Addendum)
PaPhysical Therapy Evaluation Patient Details Name: Jonathon [name] MRN: [name] DOB: Sep 30, 1935 Today's Date: 06/06/2016   History of Present Illness   81 y.o. male with medical history significant of asthma, BPH, bradycardia/SVT status post pacemaker placement, CIDP, nephrolithiasis, hypertension, presenting after being found down by his daughter.  Clinical Impression  Patient presents with decreased mobility due to limited balance, generalized weakness and poor safety awareness.  Feel he will benefit from skilled PT in the acute setting to allow return home with family support initially and plans to set up ALF long term.  He also verbalizes he may try to get life alert system.  Feel follow up HHPT appropriate as well as [name] aide at d/c.     Follow Up Recommendations Home health PT;Supervision/Assistance - 24 hour [name] aide)    Equipment Recommendations  None recommended by PT    Recommendations for Other Services       Precautions / Restrictions Precautions Precautions: Fall Restrictions Weight Bearing Restrictions: No      Mobility  Bed Mobility Overal bed mobility: Needs Assistance Bed Mobility: Supine to Sit     Supine to sit: Supervision;HOB elevated     General bed mobility comments: up in chair  Transfers Overall transfer level: Needs assistance Equipment used: Rolling walker (2 wheeled) Transfers: Sit to/from Stand Sit to Stand: Min guard         General transfer comment: Min guard for safety. Cues for hand placement.  Ambulation/Gait Ambulation/Gait assistance: Supervision;Min guard Ambulation Distance (Feet): 200 Feet Assistive device: Rolling walker (2 wheeled) Gait Pattern/deviations: Step-through pattern;Decreased stride length;Trunk flexed     General Gait Details: assist for safety, pt with some difficulty turning walker due to used to rollator.  Stairs            Wheelchair Mobility    Modified Rankin (Stroke Patients Only)        Balance Overall balance assessment: Needs assistance;History of Falls Sitting-balance support: Feet supported;No upper extremity supported Sitting balance-Leahy Scale: Good     Standing balance support: Bilateral upper extremity supported Standing balance-Leahy Scale: Fair Standing balance comment: able to stand and wash hands at sink no UE support                             Pertinent Vitals/Pain Pain Assessment: No/denies pain    Home Living Family/patient expects to be discharged to:: Private residence Living Arrangements: Alone Available Help at Discharge: Family;Available 24 hours/day (son to stay 24 hours first 5 days, then daughter back from [place] and will stay) Type of Home: House Home Access: Stairs to enter Entrance Stairs-Rails: Chemical engineer of Steps: 4 Home Layout: One level Home Equipment: Environmental consultant - 4 wheels;Cane - single point;Tub bench;Hand held shower head Additional Comments: Pt reports he plans to get set up at ALF upon d/c; he has already discussed with family.    Prior Function Level of Independence: Independent with assistive device(s)         Comments: RW for mobility. Daughter drives pt to grocery store. Pt independent with BADL, recently has only been sponge bathing.     Hand Dominance   Dominant Hand: Right    Extremity/Trunk Assessment   Upper Extremity Assessment Upper Extremity Assessment: Defer to OT evaluation    Lower Extremity Assessment Lower Extremity Assessment: LLE deficits/detail;RLE deficits/detail RLE Deficits / Details: AROM and strength WFL, some tremor noted bilaterally LLE Deficits / Details: AROM and strength  WFL, some tremor noted bilaterally    Cervical / Trunk Assessment Cervical / Trunk Assessment: Normal  Communication   Communication: No difficulties  Cognition Arousal/Alertness: Awake/alert Behavior During Therapy: WFL for tasks assessed/performed Overall Cognitive Status:  Within Functional Limits for tasks assessed                      General Comments General comments (skin integrity, edema, etc.): reports may decide to get life alert button. states passes out due to CDIP. no current lightheadedness in standing    Exercises     Assessment/Plan    PT Assessment Patient needs continued PT services  PT Problem List Decreased strength;Decreased balance;Decreased activity tolerance;Decreased knowledge of use of DME;Cardiopulmonary status limiting activity;Decreased knowledge of precautions;Decreased mobility          PT Treatment Interventions      PT Goals (Current goals can be found in the Care Plan section)  Acute Rehab PT Goals Patient Stated Goal: home then to ALF PT Goal Formulation: With patient Time For Goal Achievement: 06/13/16 Potential to Achieve Goals: Good    Frequency     Barriers to discharge        Co-evaluation               End of Session Equipment Utilized During Treatment: Gait belt Activity Tolerance: Patient tolerated treatment well Patient left: in chair;with call bell/phone within reach;with chair alarm set           Time: 1047-1110 PT Time Calculation (min) (ACUTE ONLY): 23 min   Charges:   PT Evaluation $PT Eval Moderate Complexity: 1 Procedure PT Treatments $Gait Training: 8-22 mins   PT G CodesReginia Naas 2016-07-01, 12:02 PM  Magda Kiel, [place] July 01, 2016

## 2016-06-06 NOTE — Progress Notes (Signed)
Initial Nutrition Assessment  INTERVENTION:  Ensure Enlive BID, each supplement provides 350 calories and 20 grams protein.   MVI daily  NUTRITION DIAGNOSIS:   Increased nutrient needs related to wound healing as evidenced by estimated needs.  GOAL:   Patient will meet greater than or equal to 90% of their needs  MONITOR:   PO intake, Supplement acceptance, Labs, Weight trends, I & O's, Skin  REASON FOR ASSESSMENT:   Consult Assessment of nutrition requirement/status  ASSESSMENT:   81 y.o. male with medical history significant of asthma, BPH, bradycardia/SVT status post pacemaker placement, CIDP, nephrolithiasis, hypertension, presenting after being found down by his daughter.  Pt reported decreased appetite PTA. Per pt report he has consumed 1/2 to 1/3 of meals provided while admitted, depending on foods provided. Per intake history, pt consumed 100% of meal at 10:28 this morning. Nutritional supplement had not been consumed at time of interaction, but he stated he was going to "try it later". Furthermore, he said after being discharged he plans to continue the nutritional supplementation.   Pt reports weight loss from lack of appetite. Pt reports "feeling healthy" around 180 lbs and said a year ago he was going to the gym and an increase in appetite. Since a "set-back in April" pt reports a decline. Weight has ranged from 170-179 over the past 6 mo, however no weight changes significant for time frame.  Discussed importance of protein and nutrition in gaining weight and strength. Provided examples of ways to incorporate more protein into his diet.  Per nutrition focused physical exam pt showed no muscle depletion, no fat depletion, and no edema.   Labs and medications reviewed.  Diet Order:  Diet regular Room service appropriate? Yes; Fluid consistency: Thin  Skin:  Wound (see comment) (Stage II pressure injury on sacrum)  Last BM:  1/25  Height:   Ht Readings from  Last 1 Encounters:  06/05/16 5\' 11"  (1.803 m)    Weight:   Wt Readings from Last 1 Encounters:  06/05/16 170 lb (77.1 kg)    Ideal Body Weight:  78.2 kg  BMI:  Body mass index is 23.71 kg/m.  Estimated Nutritional Needs:   Kcal:  2000-2200  Protein:  95-105  Fluid:  >/= 2 L/d  EDUCATION NEEDS:   Education needs addressed  Parks Ranger Dietetic Intern

## 2016-06-06 NOTE — Consult Note (Signed)
Cardiology Consult    Patient ID: Jonathon [name] MRN: [id number], DOB/AGE: Nov 03, 1935   Admit date: 06/05/2016 Date of Consult: 06/06/2016  Primary Physician: Nyoka Cowden, MD Reason for Consult: Syncope Primary Cardiologist: Dr. Lovena Le Requesting Provider: Dr. Erlinda Hong  Patient Profile    Jonathon [name] is a 81 year old male with a past medical history of HTN, SVT, symptomatic bradycardia s/p PPM followed by Dr. Lovena Le. Cardiology consulted for syncope.   History of Present Illness    Jonathon [name] was found on the bathroom floor by his daughter on 06/05/16 in the am. He was noted to be confused and EMS was called.   In the ED he was febrile, lactic acid was 2.36. He was started on IV antibiotics per the primary team. He tells me that he does not remember falling,he remembers going to the bathroom but the next thing he remembers after that is EMS arriving.   He has not been taking his flecainide bc "I have not been feeling well". When I ask him why he hasn't been feeling well he says, "just not right".  He has had episodes of SVT in the past (Sept. 2017) when he missed his flecainide.   He had a syncopal episode in June 2017 while working out at [employer]. He was found to be dehydrated and hypertensive. Review of chart looks like he has had 5-6 syncopal episodes in 2017.   He denies chest pain and SOB. Feels good today.   Past Medical History   Past Medical History:  Diagnosis Date  . Asthma   . BENIGN PROSTATIC HYPERTROPHY, HX OF 10/25/2008  . BRADYCARDIA 2005  . Chronic diastolic congestive heart failure ([place]) 06/05/2016  . CIDP (chronic inflammatory demyelinating polyneuropathy) ([place])   . HYPERTENSION 11/21/2006  . Iron deficiency anemia, unspecified 04/19/2013  . 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 PACEMAKER, PERMANENT 2005   Gen change 2014 Medtronic Adaptic L dual-chamber pacemaker, serial [id number]   . SVT (supraventricular tachycardia) ([place])     . TOBACCO ABUSE 10/25/2008   Quit 2012    Past Surgical History:  Procedure Laterality Date  . COLONOSCOPY  [id number]  . PACEMAKER INSERTION  2005  . PERMANENT PACEMAKER GENERATOR CHANGE N/A 06/19/2012   Procedure: PERMANENT PACEMAKER GENERATOR CHANGE;  Surgeon: [name], MD; Medtronic Adaptic L dual-chamber pacemaker, serial [id number]    . SKIN GRAFT Right 1962   wrist     Allergies  No Known Allergies  Inpatient Medications    . feeding supplement (ENSURE ENLIVE)  237 mL Oral BID BM  . flecainide  75 mg Oral BID  . heparin  5,000 Units Subcutaneous Q8H  . irbesartan  300 mg Oral Daily  . metoprolol succinate  25 mg Oral Daily  . sodium chloride flush  3 mL Intravenous Q12H  . sodium chloride flush  3 mL Intravenous Q12H  . tamsulosin  0.4 mg Oral Daily    Family History    Family History  Problem Relation Age of Onset  . Heart attack Father     Died, 37  . Kidney disease Father   . Parkinson's disease Mother     Died, 39  . Breast cancer Sister     Living, 20  . Diabetes type II Brother     Living, 12  . Breast cancer Sister     Living, 66  . Diabetes Daughter     Living, 24  . Diabetes Son  Living, 50  . Ovarian cancer Daughter   . Colon cancer Neg Hx   . Rectal cancer Neg Hx   . Stomach cancer Neg Hx     Social History    Social History   Social History  . Marital status: Divorced    Spouse name: N/A  . Number of children: 4  . Years of education: N/A   Occupational History  . [employer]   Social History Main Topics  . Smoking status: Former Smoker    Packs/day: 0.25    Years: 46.00    Types: Cigarettes    Start date: 03/16/1965    Quit date: 06/15/2010  . Smokeless tobacco: Never Used     Comment: quit 3 years ago  . Alcohol use No     Comment: 4 beers per day  . Drug use: No  . Sexual activity: Not on file   Other Topics Concern  . Not on file   Social History Narrative   Has relocated  from [place] in 2007. 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     Review of Systems    General:  No chills, fever, night sweats or weight changes.  Cardiovascular:  No chest pain, dyspnea on exertion, edema, orthopnea, palpitations, paroxysmal nocturnal dyspnea. Dermatological: No rash, lesions/masses Respiratory: No cough, dyspnea Urologic: No hematuria, dysuria Abdominal:   No nausea, vomiting, diarrhea, bright red blood per rectum, melena, or hematemesis Neurologic:  No visual changes, wkns, changes in mental status. All other systems reviewed and are otherwise negative except as noted above.  Physical Exam    Blood pressure 140/78, pulse 90, temperature 98.3 F (36.8 C), temperature source Oral, resp. rate 18, height 5\' 11"  (1.803 m), weight 170 lb (77.1 kg), SpO2 99 %.  General: Pleasant, NAD Psych: Normal affect. Neuro: Alert and oriented X 3. Moves all extremities spontaneously. HEENT: Normal  Neck: Supple without bruits or JVD. Lungs:  Resp regular and unlabored, CTA. Heart: RRR no s3, s4, or murmurs. Abdomen: Soft, non-tender, non-distended, BS + x 4.  Extremities: No clubbing, cyanosis or edema. DP/PT/Radials 2+ and equal bilaterally.  Labs    Troponin [facility] Test)  Recent Labs  06/05/16 1029  TROPIPOC 0.01    Recent Labs  06/05/16 1623 06/05/16 2128  CKTOTAL 3,535* 2,401*   Lab Results  Component Value Date   WBC 4.8 06/06/2016   HGB 10.6 (L) 06/06/2016   HCT 31.4 (L) 06/06/2016   MCV 86.7 06/06/2016   PLT 125 (L) 06/06/2016    Recent Labs Lab 06/06/16 0536  NA 135  K 4.8  CL 104  CO2 24  BUN 19  CREATININE 1.04  CALCIUM 8.1*  PROT 6.1*  BILITOT 2.2*  ALKPHOS 100  ALT 76*  AST 193*  GLUCOSE 99   Lab Results  Component Value Date   CHOL 202 (H) 01/14/2013   HDL 53.60 01/14/2013   TRIG 206.0 (H) 01/14/2013   Lab Results  Component Value Date   DDIMER 1.21 (H) 11/05/2013     Radiology Studies    Dg Chest 2  View  Result Date: 06/05/2016 CLINICAL DATA:  Fall.  Altered level of consciousness. EXAM: CHEST  2 VIEW COMPARISON:  05/04/2016 FINDINGS: Left pacer is in place with leads in the right atrium and right ventricle, unchanged. Heart and mediastinal contours are within normal limits. No focal opacities or effusions. No acute bony abnormality. IMPRESSION: No active cardiopulmonary disease. Electronically  Signed   By: Rolm Baptise M.D.   On: 06/05/2016 10:00   Ct Head Wo Contrast  Result Date: 06/05/2016 CLINICAL DATA:  Positive loss of consciousness after fall at home. EXAM: CT HEAD WITHOUT CONTRAST TECHNIQUE: Contiguous axial images were obtained from the base of the skull through the vertex without intravenous contrast. COMPARISON:  CT scan of August 10, 2014. FINDINGS: Brain: Mild diffuse cortical atrophy is noted. Mild chronic ischemic white matter disease is noted. No mass effect or midline shift is noted. Ventricular size is within normal limits. There is no evidence of mass lesion, hemorrhage or acute infarction. Vascular: Atherosclerosis of carotid siphons is noted. Skull: Normal. Negative for fracture or focal lesion. Sinuses/Orbits: No acute finding. Other: None peer IMPRESSION: Mild diffuse cortical atrophy. Mild chronic ischemic white matter disease. No acute intracranial abnormality seen. Electronically Signed   By: Marijo Conception, M.D.   On: 06/05/2016 10:22    EKG & Cardiac Imaging    EKG: NSR, RBBB  Echocardiogram: pending.   Assessment & Plan    1. Syncope: Contacted Medtronic to interrogate his device. This could be related to him missing his flecainide causing SVT which he has a history of.   He has been worked up for syncope in the past, as he has a history of somewhat frequent episodes. He was last evaluated for syncope in June 2017. He had 5-6 syncopal episodes in 2017. All of which times he has been noted to be hypertensive.   Will follow Echo results.     Signed, Arbutus Leas, NP 06/06/2016, 2:18 PM Pager: [phone]  Personally seen and examined. Agree with above.  81 year old male with syncope of unknown etiology. Pacemaker function was normal. No evidence of SVT surrounding his syncope. He did have an episode of SVT at 144 bpm brief on 06/05/16-20 minutes. Current rhythm is sinus in the 80s.  Currently alert. Heart regular rate and rhythm, lungs are clear  Possible orthostatic changes, possible intravascular volume depletion,   CK total was 3500, lactic acid was elevated at 2.36. Albumin low at 2.2. Liver functions were elevated. Likely metabolic source for syncope.  I do not think that SVT 144 bpm was the ultimate reason for his syncope. Consider restarting flecainide at least on discharge.  Candee Furbish, MD

## 2016-06-06 NOTE — Progress Notes (Signed)
  Echocardiogram 2D Echocardiogram has been performed.  Jonathon [name] M 06/06/2016, 3:39 PM

## 2016-06-06 NOTE — Progress Notes (Signed)
PROGRESS NOTE  Jonathon [name] [id number] DOB: Dec 09, 1935 DOA: 06/05/2016 PCP: Nyoka Cowden, MD  HPI/Recap of past 24 hours:  Feeling better  Assessment/Plan: Active Problems:   Essential hypertension   Symptomatic bradycardia   PACEMAKER, PERMANENT   Sustained SVT (HCC)   CIDP (chronic inflammatory demyelinating polyneuropathy) (H[facility])   Syncope   FUO (fever of unknown origin)   Hepatic steatosis   Physical deconditioning   Chronic diastolic congestive heart failure ([place])   Syncope:  Patient report he has been having these episodes for two years Past EEG in 2016 no seizure   cardiac etiology vs orthostasis vs dehydration or vasovagal w/ possible viral/bacterial illness. Will check am cortisol level Echo pending, Cardiology consulted   Lactic acidosis: from dehydration? Normalized with hydration  Rhabdomyolysis with elevated ck: from down for unknown duration. Urine is dark, urine no infection On gentle hydration   FUO: Fever to 101. Tachycardia time of presentation with intermittent tachypnea. O2 saturations normal on room air. No reported symptoms at home to suggest infection. UA unremarkable. Basic chemistries unchanged from previous. CXR without evidence of acute process. Normal white blood cell count. Lactic acid 2.36 at time of admission. Patient treated per sepsis protocol and started on vancomycin and Zosyn and Tamiflu. Overall patient is well appearing. Influenza PCR negative. Patient's dry cough is chronic and at baseline. -  urine culture and blood cultures no growth - Respiratory viral panel negative - Hold further antibiotics - Procalcitonin One time fever could be associated with rhabdomyolysis  SVT/Bradycardia: Condition well controlled after starting flecainide and having pacemaker placed.  - Tele - Continue metop Cardiology consulted  HTN: - continue metop, avapro  BPH:  - continue flomax  CKD: Creatinine 1.35. At baseline. - IVF,  CMP in am  DCHF: EF 65% with grade 1 diastolic noted on previous echo. No evidence of decompensation. - Strict I's and O's, daily weights  Physical deconditioning: h/o CIDP. Pt endorses gradual worsening of physical strength and poor oral intake - PT/OT/Nutrition - CSW for placement - pt amenable. May need rehab for a few weeks to get pts strength back.   Hepatic steatosis: Chronic. With chronically elevated lft  Alkaline phosphatase 136, AST 285, ALT 99, T bili 2.7, albumin 2.7 - CMP in am     DVT prophylaxis:  Hep  Code Status: FULL  Family Communication: patient Disposition Plan: pending improvement, syncope eval, and PT eval. Suspect pt may need placement   Consults called: cardiology   Procedures:  none  Antibiotics:  Zosyn x1  vancx1   Objective: BP 140/78   Pulse 90   Temp 98.3 F (36.8 C) (Oral)   Resp 18   Ht 5\' 11"  (1.803 m)   Wt 77.1 kg (170 lb)   SpO2 99%   BMI 23.71 kg/m   Intake/Output Summary (Last 24 hours) at 06/06/16 1436 Last data filed at 06/06/16 1028  Gross per 24 hour  Intake             1300 ml  Output                0 ml  Net             1300 ml   Filed Weights   06/05/16 0927  Weight: 77.1 kg (170 lb)    Exam:   General:  NAD  Cardiovascular: RRR  Respiratory: CTABL  Abdomen: Soft/ND/NT, positive BS  Musculoskeletal: No Edema  Neuro: aaox3  Data Reviewed: Basic Metabolic  Panel:  Recent Labs Lab 06/05/16 1102 06/05/16 2128 06/06/16 0536  NA 132*  --  135  K 5.0  --  4.8  CL 98*  --  104  CO2 21*  --  24  GLUCOSE 106*  --  99  BUN 19  --  19  CREATININE 1.35*  --  1.04  CALCIUM 8.5*  --  8.1*  MG  --  1.4*  --   PHOS  --  2.7  --    Liver Function Tests:  Recent Labs Lab 06/05/16 1102 06/06/16 0536  AST 285* 193*  ALT 99* 76*  ALKPHOS 136* 100  BILITOT 2.7* 2.2*  PROT 7.2 6.1*  ALBUMIN 2.7* 2.2*   No results for input(s): LIPASE, AMYLASE in the last 168 hours. No results for  input(s): AMMONIA in the last 168 hours. CBC:  Recent Labs Lab 06/05/16 1018 06/06/16 0536  WBC 6.3 4.8  NEUTROABS 4.7  --   HGB 12.8* 10.6*  HCT 36.3* 31.4*  MCV 85.4 86.7  PLT 162 125*   Cardiac Enzymes:    Recent Labs Lab 06/05/16 1623 06/05/16 2128  CKTOTAL 3,535* 2,401*   BNP (last 3 results) No results for input(s): BNP in the last 8760 hours.  ProBNP (last 3 results) No results for input(s): PROBNP in the last 8760 hours.  CBG:  Recent Labs Lab 06/06/16 0815  GLUCAP 103*    Recent Results (from the past 240 hour(s))  Urine culture     Status: None   Collection Time: 06/05/16  1:39 PM  Result Value Ref Range Status   Specimen Description URINE, CLEAN CATCH  Final   Special Requests NONE  Final   Culture NO GROWTH  Final   Report Status 06/06/2016 FINAL  Final  Respiratory Panel by PCR     Status: None   Collection Time: 06/05/16  5:22 PM  Result Value Ref Range Status   Adenovirus NOT DETECTED NOT DETECTED Final   Coronavirus 229E NOT DETECTED NOT DETECTED Final   Coronavirus HKU1 NOT DETECTED NOT DETECTED Final   Coronavirus NL63 NOT DETECTED NOT DETECTED Final   Coronavirus OC43 NOT DETECTED NOT DETECTED Final   Metapneumovirus NOT DETECTED NOT DETECTED Final   Rhinovirus / Enterovirus NOT DETECTED NOT DETECTED Final   Influenza A NOT DETECTED NOT DETECTED Final   Influenza B NOT DETECTED NOT DETECTED Final   Parainfluenza Virus 1 NOT DETECTED NOT DETECTED Final   Parainfluenza Virus 2 NOT DETECTED NOT DETECTED Final   Parainfluenza Virus 3 NOT DETECTED NOT DETECTED Final   Parainfluenza Virus 4 NOT DETECTED NOT DETECTED Final   Respiratory Syncytial Virus NOT DETECTED NOT DETECTED Final   Bordetella pertussis NOT DETECTED NOT DETECTED Final   Chlamydophila pneumoniae NOT DETECTED NOT DETECTED Final   Mycoplasma pneumoniae NOT DETECTED NOT DETECTED Final     Studies: No results found.  Scheduled Meds: . feeding supplement (ENSURE ENLIVE)   237 mL Oral BID BM  . flecainide  75 mg Oral BID  . heparin  5,000 Units Subcutaneous Q8H  . irbesartan  300 mg Oral Daily  . metoprolol succinate  25 mg Oral Daily  . sodium chloride flush  3 mL Intravenous Q12H  . sodium chloride flush  3 mL Intravenous Q12H  . tamsulosin  0.4 mg Oral Daily    Continuous Infusions: . sodium chloride 50 mL/hr at 06/05/16 2135     Time spent: 88mins  Paulette  MD, PhD  [facility] Pager [phone].  If 7PM-7AM, please contact night-coverage at [email or web address], password [email or web address] 06/06/2016, 2:36 PM  LOS: 1 day

## 2016-06-07 DIAGNOSIS — R55 Syncope and collapse: Secondary | ICD-10-CM

## 2016-06-07 LAB — COMPREHENSIVE METABOLIC PANEL
ALBUMIN: 2.2 g/dL — AB (ref 3.5–5.0)
ALK PHOS: 93 U/L (ref 38–126)
ALT: 95 U/L — AB (ref 17–63)
ANION GAP: 8 (ref 5–15)
AST: 228 U/L — ABNORMAL HIGH (ref 15–41)
BILIRUBIN TOTAL: 1.9 mg/dL — AB (ref 0.3–1.2)
BUN: 19 mg/dL (ref 6–20)
CALCIUM: 8.3 mg/dL — AB (ref 8.9–10.3)
CO2: 22 mmol/L (ref 22–32)
CREATININE: 0.97 mg/dL (ref 0.61–1.24)
Chloride: 105 mmol/L (ref 101–111)
GFR calc Af Amer: >60 mL/min (ref >60)
GFR calc non Af Amer: >60 mL/min (ref >60)
GLUCOSE: 89 mg/dL (ref 65–99)
Potassium: 4.5 mmol/L (ref 3.5–5.1)
SODIUM: 135 mmol/L (ref 135–145)
TOTAL PROTEIN: 6.2 g/dL — AB (ref 6.5–8.1)

## 2016-06-07 LAB — CORTISOL: Cortisol, Plasma: 17.5 ug/dL

## 2016-06-07 LAB — CBC WITH DIFFERENTIAL/PLATELET
BASOS PCT: 1 %
Basophils Absolute: 0 10*3/uL (ref 0.0–0.1)
Eosinophils Absolute: 0.1 10*3/uL (ref 0.0–0.7)
Eosinophils Relative: 2 %
HEMATOCRIT: 29.3 % — AB (ref 39.0–52.0)
HEMOGLOBIN: 9.8 g/dL — AB (ref 13.0–17.0)
LYMPHS ABS: 1 10*3/uL (ref 0.7–4.0)
Lymphocytes Relative: 24 %
MCH: 29 pg (ref 26.0–34.0)
MCHC: 33.4 g/dL (ref 30.0–36.0)
MCV: 86.7 fL (ref 78.0–100.0)
MONOS PCT: 18 %
Monocytes Absolute: 0.7 10*3/uL (ref 0.1–1.0)
NEUTROS ABS: 2.3 10*3/uL (ref 1.7–7.7)
NEUTROS PCT: 55 %
Platelets: 154 10*3/uL (ref 150–400)
RBC: 3.38 MIL/uL — AB (ref 4.22–5.81)
RDW: 13.7 % (ref 11.5–15.5)
WBC: 4.1 10*3/uL (ref 4.0–10.5)

## 2016-06-07 LAB — MAGNESIUM: Magnesium: 1.5 mg/dL — ABNORMAL LOW (ref 1.7–2.4)

## 2016-06-07 LAB — PROCALCITONIN: PROCALCITONIN: 0.46 ng/mL

## 2016-06-07 LAB — CK: Total CK: 1197 U/L — ABNORMAL HIGH (ref 49–397)

## 2016-06-07 LAB — GLUCOSE, CAPILLARY: GLUCOSE-CAPILLARY: 86 mg/dL (ref 65–99)

## 2016-06-07 MED ORDER — ENSURE ENLIVE PO LIQD: 237.0000 mL | Freq: Two times a day (BID) | ORAL | 12 refills | Status: DC

## 2016-06-07 MED ORDER — MAGNESIUM SULFATE 2 GM/50ML IV SOLN
2.0000 g | Freq: Once | INTRAVENOUS | Status: AC
  Administered 2016-06-07: 2 g via INTRAVENOUS
  Filled 2016-06-07: qty 50

## 2016-06-07 MED ORDER — PNEUMOCOCCAL VAC POLYVALENT 25 MCG/0.5ML IJ INJ: 0.5000 mL | INJECTION | Freq: Once | INTRAMUSCULAR | Status: DC

## 2016-06-07 NOTE — Care Management Important Message (Signed)
Important Message  Patient Details  Name: Jonathon [name] MRN: [name] Date of Birth: [birth year]   Medicare Important Message Given:  Yes    Carles Collet, RN 06/07/2016, 8:25 AM

## 2016-06-07 NOTE — Procedures (Signed)
Electroencephalogram (EEG) Report  Date of study: 06/06/16  Requesting clinician: Florencia Reasons, MD  Reason for study: Evaluate for seizure  Brief clinical history: This is a 81 year old man who is admitted after being found down at home by his daughter. EEG is being done or questions as part of his evaluation.  Medications:  Current Facility-Administered Medications:  .  0.9 %  sodium chloride infusion, , Intravenous, Continuous, Waldemar Dickens, MD, Last Rate: 50 mL/hr at 06/07/16 0432 .  acetaminophen (TYLENOL) tablet 650 mg, 650 mg, Oral, Q6H PRN **OR** acetaminophen (TYLENOL) suppository 650 mg, 650 mg, Rectal, Q6H PRN, Waldemar Dickens, MD .  feeding supplement (ENSURE ENLIVE) (ENSURE ENLIVE) liquid 237 mL, 237 mL, Oral, BID BM, Waldemar Dickens, MD, 237 mL at 06/06/16 1519 .  flecainide (TAMBOCOR) tablet 75 mg, 75 mg, Oral, BID, Waldemar Dickens, MD, 75 mg at 06/06/16 2235 .  heparin injection 5,000 Units, 5,000 Units, Subcutaneous, Q8H, Waldemar Dickens, MD, 5,000 Units at 06/07/16 0532 .  irbesartan (AVAPRO) tablet 300 mg, 300 mg, Oral, Daily, Waldemar Dickens, MD, 300 mg at 06/06/16 0914 .  metoprolol succinate (TOPROL-XL) 24 hr tablet 25 mg, 25 mg, Oral, Daily, Waldemar Dickens, MD, 25 mg at 06/06/16 0915 .  multivitamin with minerals tablet 1 tablet, 1 tablet, Oral, Daily, Jenifer A Williams, RD, 1 tablet at 06/06/16 1520 .  ondansetron (ZOFRAN) tablet 4 mg, 4 mg, Oral, Q6H PRN **OR** ondansetron (ZOFRAN) injection 4 mg, 4 mg, Intravenous, Q6H PRN, Waldemar Dickens, MD .  oxyCODONE (Oxy IR/ROXICODONE) immediate release tablet 5 mg, 5 mg, Oral, Q4H PRN, Waldemar Dickens, MD .  sodium chloride flush (NS) 0.9 % injection 3 mL, 3 mL, Intravenous, Q12H, Waldemar Dickens, MD, 3 mL at 06/06/16 2236 .  sodium chloride flush (NS) 0.9 % injection 3 mL, 3 mL, Intravenous, Q12H, Waldemar Dickens, MD, 3 mL at 06/06/16 2236 .  tamsulosin (FLOMAX) capsule 0.4 mg, 0.4 mg, Oral, Daily, Waldemar Dickens, MD, 0.4 mg  at 06/06/16 [id number]  Description: This is a routine EEG performed using standard international 10-20 electrode placement. A total of 18 channels are recorded, including one for the EKG. Wakefulness and drowsiness are recorded.   Activating Maneuvers: None  Findings:  The EKG channel demonstrates a regular rhythm with a rate of about 80 beats per minute.   The background consists of well-formed activity with normal amplitudes. The best dominant posterior rhythm is 10 Hz. This is symmetric and reacts as expected with eye opening.   There are no focal asymmetries. No epileptiform discharges are present. No seizures are recorded.   Drowsiness is recorded and is normal in appearance.   Impression:  This is a normal awake and drowsy EEG.  Clinical correlation: This normal EEG does not explain the patient's clinical presentation.Melba Coon, MD [facility]

## 2016-06-07 NOTE — Discharge Summary (Addendum)
Discharge Summary  Jonathon [name] [id number] DOB: 1935/08/31  PCP: Nyoka Cowden, MD  Admit date: 06/05/2016 Discharge date: 06/07/2016  Time spent: <18mins  Recommendations for Outpatient Follow-up:  1. F/u with PMD within a week  for hospital discharge follow up, repeat cbc/cmp at follow up 2. F/u with cardiology  Discharge Diagnoses:  Active Hospital Problems   Diagnosis Date Noted  . FUO (fever of unknown origin) 06/05/2016  . Hepatic steatosis 06/05/2016  . Physical deconditioning 06/05/2016  . Chronic diastolic congestive heart failure ([place]) 06/05/2016  . Syncope 08/10/2014  . CIDP (chronic inflammatory demyelinating polyneuropathy) ([place]) 08/08/2014  . Sustained SVT ([place]) 07/27/2014  . Symptomatic bradycardia 10/25/2008  . PACEMAKER, PERMANENT 10/25/2008  . Essential hypertension 11/21/2006    Resolved Hospital Problems   Diagnosis Date Noted Date Resolved  No resolved problems to display.    Discharge Condition: stable  Diet recommendation: heart healthy  Filed Weights   06/05/16 0927 06/07/16 0512  Weight: 77.1 kg (170 lb) 77.2 kg (170 lb 3.2 oz)    History of present illness:  PCP: Nyoka Cowden, MD Patient coming from: home  Chief Complaint: found down  HPI: Jonathon [name] is a 81 y.o. male with medical history significant of asthma, BPH, bradycardia/SVT status post pacemaker placement, CIDP, nephrolithiasis, hypertension, presenting after being found down by his daughter. Of note patient has no recollection of how he ended up on the floor of the bathroom. No loss of bowel or bladder function, no tongue injury. Last reported communication with the patient was sometime in the afternoon the day prior to admission. Patient was still in his close from the previous day so the suspicion is that he fell some time during the evening and stayed on the floor in the bathroom throughout the night. EMS was called to evaluate patient and patient was  noted be confused at time of evaluation. Patient was afebrile at that time and vital signs were otherwise stable. Patient states he has a nonproductive cough which is chronic and unchanged from baseline for him. Denies any other symptoms such as fever, chest pain, shortness of breath, palpitations, nausea, vomiting, diarrhea, neck stiffness, headache, dizzines, lower extremity swelling.   ED Course: Concern for sepsis. Broad-spectrum antibiotics started with fluid boluses. Objective findings outlined below.   Hospital Course:  Active Problems:   Essential hypertension   Symptomatic bradycardia   PACEMAKER, PERMANENT   Sustained SVT (HCC)   CIDP (chronic inflammatory demyelinating polyneuropathy) (H[facility])   Syncope   FUO (fever of unknown origin)   Hepatic steatosis   Physical deconditioning   Chronic diastolic congestive heart failure ([place])  Syncope: recurrent Patient report he has been having these episodes for two years Past EEG in 2016 no seizure , repeat EEG this hospitalization unremarkable, am cortisol level unremarkable Echo +LVH, no wall motion abnormality , lvef WNL, grade I diastolic dysfunction  patient does admit that he does not drink enough fluids, and he does not take flecainide consistently Recurrent syncope likely multifactorial He is encouraged to take his flecainide as prescribed, avoid dehydration, he is cleared to discharge home by cardiology, home health arranged.    Lactic acidosis: from dehydration? Normalized with hydration  Rhabdomyolysis with elevated ck: from down for unknown duration. Urine is dark, urine no infection On gentle hydration, ck trending down, cr nwl   [id number] to 101. Tachycardia time of presentation with intermittent tachypnea. O2 saturations normal on room air. No reported symptoms at home to suggest infection. UA unremarkable. Basic  chemistries unchanged from previous. CXR without evidence of acute process. Normal white blood cell  count. Lactic acid 2.36 at time of admission. Patient treated per sepsis protocol and started on vancomycin and Zosyn and Tamiflu. Overall patient is well appearing. Influenza PCR negative. Patient's dry cough is chronic and at baseline. -  urine culture and blood cultures no growth - Respiratory viral panel negative - Hold further antibiotics - Procalcitonin flat at 0.48-0.46 One time fever could be associated with rhabdomyolysis  AKI on CKD II: Creatinine 1.35. On admission,  - ua no infection With hydration, Cr normalized, 0.97 at discharge  SVT/Bradycardia: Condition well controlled after starting flecainide and having pacemaker placed.  - Continue metop, flecainide ( he does not take flecainide consistently at home) Cardiology consulted, input appreciated, he is to continue follow with cardiology outpatient  Hypomagnesemia: replace mag  Chronic DCHF: EF 65% with grade 1 diastolic noted on previous echo. No evidence of decompensation. - he is dehydrated clinically  Hepatic steatosis: Chronic. With chronically elevated lft  Alkaline phosphatase 136, AST 285, ALT 99, T bili 2.7, albumin 2.7 - pmd follow up  HTN: - stable continue metop, avapro  BPH:  - continue flomax    Physical deconditioning: h/o CIDP. Pt endorses gradual worsening of physical strength and poor oral intake - home with home health for now, patient report his family is  Trying to decide ALF placement in the near futher      DVT prophylaxis while in the hospital:Hep Code Status:FULL Family Communication:patient Disposition Plan:home with home health  Consults called:cardiology   Procedures:  EEG unremarkable  Antibiotics:  Zosyn x1  vancx1    Discharge Exam: BP (!) 152/86 (BP Location: Right Arm) Comment: nurse notified  Pulse 75   Temp 98.5 F (36.9 C) (Oral)   Resp 18   Ht 5\' 11"  (1.803 m)   Wt 77.2 kg (170 lb 3.2 oz)   SpO2 100%   BMI 23.74 kg/m      General:  NAD, look stronger  Cardiovascular: RRR  Respiratory: CTABL  Abdomen: Soft/ND/NT, positive BS  Musculoskeletal: No Edema  Neuro: aaox3   Discharge Instructions You were cared for by a hospitalist during your hospital stay. If you have any questions about your discharge medications or the care you received while you were in the hospital after you are discharged, you can call the unit and asked to speak with the hospitalist on call if the hospitalist that took care of you is not available. Once you are discharged, your primary care physician will handle any further medical issues. Please note that NO REFILLS for any discharge medications will be authorized once you are discharged, as it is imperative that you return to your primary care physician (or establish a relationship with a primary care physician if you do not have one) for your aftercare needs so that they can reassess your need for medications and monitor your lab values.  Discharge Instructions    Diet - low sodium heart healthy    Complete by:  As directed    Avoid dehydration   Face-to-face encounter (required for Medicare/Medicaid patients)    Complete by:  As directed    I Othell Diluzio certify that this patient is under my care and that I, or a nurse practitioner or physician's assistant working with me, had a face-to-face encounter that meets the physician face-to-face encounter requirements with this patient on 06/07/2016. The encounter with the patient was in whole, or in part for the  following medical condition(s) which is the primary reason for home health care (List medical condition): FTT   The encounter with the patient was in whole, or in part, for the following medical condition, which is the primary reason for home health care:  FTT   I certify that, based on my findings, the following services are medically necessary home health services:  Physical therapy   Reason for Medically Necessary Home Health  Services:  Therapy- Personnel officer, Public librarian   My clinical findings support the need for the above services:  Unsafe ambulation due to balance issues   Further, I certify that my clinical findings support that this patient is homebound due to:  Unsafe ambulation due to balance issues   Home Health    Complete by:  As directed    To provide the following care/treatments:   PT Home Health Aide     Increase activity slowly    Complete by:  As directed      Allergies as of 06/07/2016   No Known Allergies     Medication List    STOP taking these medications   gabapentin 100 MG capsule Commonly known as:  NEURONTIN     TAKE these medications   b complex vitamins tablet Take 1 tablet by mouth daily as needed (for supplement).   feeding supplement (ENSURE ENLIVE) Liqd Take 237 mLs by mouth 2 (two) times daily between meals.   Fish Oil 1000 MG Caps Take 1 capsule by mouth daily as needed (for supplement).   flecainide 150 MG tablet Commonly known as:  TAMBOCOR Take 0.5 tablets (75 mg total) by mouth 2 (two) times daily.   Garlic 123XX123 MG Caps Take 2,000 mg by mouth daily as needed (supplement).   irbesartan 300 MG tablet Commonly known as:  AVAPRO Take 1 tablet (300 mg total) by mouth daily.   metoprolol succinate 25 MG 24 hr tablet Commonly known as:  TOPROL XL Take 1 tablet (25 mg total) by mouth daily.   tamsulosin 0.4 MG Caps capsule Commonly known as:  FLOMAX TAKE 1 CAPSULE BY MOUTH EVERY DAY   VITAMIN B 12 PO Take 1,000 mg by mouth daily as needed (for supplement).   Vitamin D (Ergocalciferol) 50000 units Caps capsule Commonly known as:  DRISDOL Take 1 capsule (50,000 Units total) by mouth every 7 (seven) days.      No Known Allergies Follow-up Information    Nyoka Cowden, MD Follow up in 1 week(s).   Specialty:  Internal Medicine Why:  hospital discharge follow up, pmd to monitor liver function. Contact  information: [place] [address] [phone]            The results of significant diagnostics from this hospitalization (including imaging, microbiology, ancillary and laboratory) are listed below for reference.    Significant Diagnostic Studies: Dg Chest 2 View  Result Date: 06/05/2016 CLINICAL DATA:  Fall.  Altered level of consciousness. EXAM: CHEST  2 VIEW COMPARISON:  05/04/2016 FINDINGS: Left pacer is in place with leads in the right atrium and right ventricle, unchanged. Heart and mediastinal contours are within normal limits. No focal opacities or effusions. No acute bony abnormality. IMPRESSION: No active cardiopulmonary disease. Electronically Signed   By: Rolm Baptise M.D.   On: 06/05/2016 10:00   Ct Head Wo Contrast  Result Date: 06/05/2016 CLINICAL DATA:  Positive loss of consciousness after fall at home. EXAM: CT HEAD WITHOUT CONTRAST TECHNIQUE: Contiguous axial images  were obtained from the base of the skull through the vertex without intravenous contrast. COMPARISON:  CT scan of August 10, 2014. FINDINGS: Brain: Mild diffuse cortical atrophy is noted. Mild chronic ischemic white matter disease is noted. No mass effect or midline shift is noted. Ventricular size is within normal limits. There is no evidence of mass lesion, hemorrhage or acute infarction. Vascular: Atherosclerosis of carotid siphons is noted. Skull: Normal. Negative for fracture or focal lesion. Sinuses/Orbits: No acute finding. Other: None peer IMPRESSION: Mild diffuse cortical atrophy. Mild chronic ischemic white matter disease. No acute intracranial abnormality seen. Electronically Signed   By: Marijo Conception, M.D.   On: 06/05/2016 10:22    Microbiology: Recent Results (from the past 240 hour(s))  Blood Culture (routine x 2)     Status: None (Preliminary result)   Collection Time: 06/05/16 11:03 AM  Result Value Ref Range Status   Specimen Description BLOOD LEFT ANTECUBITAL   Final   Special Requests BOTTLES DRAWN AEROBIC AND ANAEROBIC 5CC  Final   Culture NO GROWTH 1 DAY  Final   Report Status PENDING  Incomplete  Blood Culture (routine x 2)     Status: None (Preliminary result)   Collection Time: 06/05/16 11:06 AM  Result Value Ref Range Status   Specimen Description BLOOD RIGHT ANTECUBITAL  Final   Special Requests BOTTLES DRAWN AEROBIC AND ANAEROBIC 5CC  Final   Culture NO GROWTH 1 DAY  Final   Report Status PENDING  Incomplete  Urine culture     Status: None   Collection Time: 06/05/16  1:39 PM  Result Value Ref Range Status   Specimen Description URINE, CLEAN CATCH  Final   Special Requests NONE  Final   Culture NO GROWTH  Final   Report Status 06/06/2016 FINAL  Final  Respiratory Panel by PCR     Status: None   Collection Time: 06/05/16  5:22 PM  Result Value Ref Range Status   Adenovirus NOT DETECTED NOT DETECTED Final   Coronavirus 229E NOT DETECTED NOT DETECTED Final   Coronavirus HKU1 NOT DETECTED NOT DETECTED Final   Coronavirus NL63 NOT DETECTED NOT DETECTED Final   Coronavirus OC43 NOT DETECTED NOT DETECTED Final   Metapneumovirus NOT DETECTED NOT DETECTED Final   Rhinovirus / Enterovirus NOT DETECTED NOT DETECTED Final   Influenza A NOT DETECTED NOT DETECTED Final   Influenza B NOT DETECTED NOT DETECTED Final   Parainfluenza Virus 1 NOT DETECTED NOT DETECTED Final   Parainfluenza Virus 2 NOT DETECTED NOT DETECTED Final   Parainfluenza Virus 3 NOT DETECTED NOT DETECTED Final   Parainfluenza Virus 4 NOT DETECTED NOT DETECTED Final   Respiratory Syncytial Virus NOT DETECTED NOT DETECTED Final   Bordetella pertussis NOT DETECTED NOT DETECTED Final   Chlamydophila pneumoniae NOT DETECTED NOT DETECTED Final   Mycoplasma pneumoniae NOT DETECTED NOT DETECTED Final     Labs: Basic Metabolic Panel:  Recent Labs Lab 06/05/16 1102 06/05/16 2128 06/06/16 0536 06/07/16 0459  NA 132*  --  135 135  K 5.0  --  4.8 4.5  CL 98*  --  104 105   CO2 21*  --  24 22  GLUCOSE 106*  --  99 89  BUN 19  --  19 19  CREATININE 1.35*  --  1.04 0.97  CALCIUM 8.5*  --  8.1* 8.3*  MG  --  1.4*  --  1.5*  PHOS  --  2.7  --   --  Liver Function Tests:  Recent Labs Lab 06/05/16 1102 06/06/16 0536 06/07/16 0459  AST 285* 193* 228*  ALT 99* 76* 95*  ALKPHOS 136* 100 93  BILITOT 2.7* 2.2* 1.9*  PROT 7.2 6.1* 6.2*  ALBUMIN 2.7* 2.2* 2.2*   No results for input(s): LIPASE, AMYLASE in the last 168 hours. No results for input(s): AMMONIA in the last 168 hours. CBC:  Recent Labs Lab 06/05/16 1018 06/06/16 0536 06/07/16 0459  WBC 6.3 4.8 4.1  NEUTROABS 4.7  --  2.3  HGB 12.8* 10.6* 9.8*  HCT 36.3* 31.4* 29.3*  MCV 85.4 86.7 86.7  PLT 162 125* 154   Cardiac Enzymes:  Recent Labs Lab 06/05/16 1623 06/05/16 2128 06/07/16 0459  CKTOTAL 3,535* 2,401* 1,197*   BNP: BNP (last 3 results) No results for input(s): BNP in the last 8760 hours.  ProBNP (last 3 results) No results for input(s): PROBNP in the last 8760 hours.  CBG:  Recent Labs Lab 06/06/16 0815 06/07/16 0815  GLUCAP 103* 86       Signed:  Cleaster Shiffer MD, PhD  [facility] 06/07/2016, 10:12 AM

## 2016-06-07 NOTE — Care Management Note (Addendum)
Case Management Note  Patient Details  Name: Jonathon [name] MRN: [name] Date of Birth: 10/21/1935  Subjective/Objective:                 Spoke with patient at the bedside. Expected DC today. PT rec HH. Patient provided choice and would like to use [facility]. CM will request [place] orders from Dr Erlinda Hong. Patient states that he has two RW, no equipment needed. He lives at home alone but his daughter checks up on him three times a week or so and can drive him to appointments and pharmacy.    Action/Plan:  Referral made to [place] clinical liaison [facility], Dr Erlinda Hong text paged for [facility] PT order.  Expected Discharge Date:                  Expected Discharge Plan:  [place]  In-House Referral:     Discharge planning Services  CM Consult  Post Acute Care Choice:  Home Health Choice offered to:  Patient  DME Arranged:    DME Agency:     HH Arranged:  PT [place]:  [place]  Status of Service:  Completed, signed off  If discussed at [place] of Stay Meetings, dates discussed:    Additional Comments:  Carles Collet, RN 06/07/2016, 8:27 AM

## 2016-06-07 NOTE — Progress Notes (Signed)
Progress Note  Patient Name: Jonathon [name] Date of Encounter: 06/07/2016  Primary Cardiologist: Dr. Lovena Le  Subjective   No further syncope. EEG normal, No CP, no SOB  Inpatient Medications    Scheduled Meds: . feeding supplement (ENSURE ENLIVE)  237 mL Oral BID BM  . flecainide  75 mg Oral BID  . heparin  5,000 Units Subcutaneous Q8H  . irbesartan  300 mg Oral Daily  . magnesium sulfate 1 - 4 g bolus IVPB  2 g Intravenous Once  . metoprolol succinate  25 mg Oral Daily  . multivitamin with minerals  1 tablet Oral Daily  . sodium chloride flush  3 mL Intravenous Q12H  . sodium chloride flush  3 mL Intravenous Q12H  . tamsulosin  0.4 mg Oral Daily   Continuous Infusions: . sodium chloride 50 mL/hr at 06/07/16 0432   PRN Meds: acetaminophen **OR** acetaminophen, ondansetron **OR** ondansetron (ZOFRAN) IV, oxyCODONE   Vital Signs    Vitals:   06/06/16 0914 06/06/16 1826 06/06/16 2121 06/07/16 0512  BP: 140/78 132/89 138/88 (!) 152/86  Pulse: 90 91 75 75  Resp:  16 18 18   Temp:  98 F (36.7 C) 98.2 F (36.8 C) 98.5 F (36.9 C)  TempSrc:  Oral  Oral  SpO2:  96% 100% 100%  Weight:    170 lb 3.2 oz (77.2 kg)  Height:        Intake/Output Summary (Last 24 hours) at 06/07/16 0829 Last data filed at 06/07/16 0207  Gross per 24 hour  Intake              670 ml  Output              500 ml  Net              170 ml   Filed Weights   06/05/16 0927 06/07/16 0512  Weight: 170 lb (77.1 kg) 170 lb 3.2 oz (77.2 kg)    Telemetry    No adverse rhythms - Personally Reviewed  ECG    NSR, RBBB - Personally Reviewed  Physical Exam   GEN: No acute distress.  Neck: No JVD Cardiac: RRR, no murmurs, rubs, or gallops.  Respiratory: Clear to auscultation bilaterally. GI: Soft, nontender, non-distended  MS: No edema; No deformity. Neuro:  AAOx3. Psych: Normal affect  Labs    Chemistry Recent Labs Lab 06/05/16 1102 06/06/16 0536 06/07/16 0459  NA 132* 135 135  K  5.0 4.8 4.5  CL 98* 104 105  CO2 21* 24 22  GLUCOSE 106* 99 89  BUN 19 19 19   CREATININE 1.35* 1.04 0.97  CALCIUM 8.5* 8.1* 8.3*  PROT 7.2 6.1* 6.2*  ALBUMIN 2.7* 2.2* 2.2*  AST 285* 193* 228*  ALT 99* 76* 95*  ALKPHOS 136* 100 93  BILITOT 2.7* 2.2* 1.9*  GFRNONAA 49* >60 >60  GFRAA 56* >60 >60  ANIONGAP 13 7 8      Hematology Recent Labs Lab 06/05/16 1018 06/06/16 0536 06/07/16 0459  WBC 6.3 4.8 4.1  RBC 4.25 3.62* 3.38*  HGB 12.8* 10.6* 9.8*  HCT 36.3* 31.4* 29.3*  MCV 85.4 86.7 86.7  MCH 30.1 29.3 29.0  MCHC 35.3 33.8 33.4  RDW 14.1 14.1 13.7  PLT 162 125* 154    Cardiac EnzymesNo results for input(s): TROPONINI in the last 168 hours.  Recent Labs Lab 06/05/16 1029  TROPIPOC 0.01     BNPNo results for input(s): BNP, PROBNP in the last 168 hours.  DDimer No results for input(s): DDIMER in the last 168 hours.   Radiology    Dg Chest 2 View  Result Date: 06/05/2016 CLINICAL DATA:  Fall.  Altered level of consciousness. EXAM: CHEST  2 VIEW COMPARISON:  05/04/2016 FINDINGS: Left pacer is in place with leads in the right atrium and right ventricle, unchanged. Heart and mediastinal contours are within normal limits. No focal opacities or effusions. No acute bony abnormality. IMPRESSION: No active cardiopulmonary disease. Electronically Signed   By: Rolm Baptise M.D.   On: 06/05/2016 10:00   Ct Head Wo Contrast  Result Date: 06/05/2016 CLINICAL DATA:  Positive loss of consciousness after fall at home. EXAM: CT HEAD WITHOUT CONTRAST TECHNIQUE: Contiguous axial images were obtained from the base of the skull through the vertex without intravenous contrast. COMPARISON:  CT scan of August 10, 2014. FINDINGS: Brain: Mild diffuse cortical atrophy is noted. Mild chronic ischemic white matter disease is noted. No mass effect or midline shift is noted. Ventricular size is within normal limits. There is no evidence of mass lesion, hemorrhage or acute infarction. Vascular:  Atherosclerosis of carotid siphons is noted. Skull: Normal. Negative for fracture or focal lesion. Sinuses/Orbits: No acute finding. Other: None peer IMPRESSION: Mild diffuse cortical atrophy. Mild chronic ischemic white matter disease. No acute intracranial abnormality seen. Electronically Signed   By: Marijo Conception, M.D.   On: 06/05/2016 10:22    Cardiac Studies   - Left ventricle: The cavity size was normal. Wall thickness was   increased in a pattern of mild LVH. Systolic function was normal.   The estimated ejection fraction was in the range of 55% to 60%.   Wall motion was normal; there were no regional wall motion   abnormalities. Doppler parameters are consistent with abnormal   left ventricular relaxation (grade 1 diastolic dysfunction). - Mitral valve: There was mild regurgitation. - Right atrium: A pacer wire is seen in the RA and in the 4 chamber   view has the appearance of potential dislodgement. Suggest CXR   and clinical correlation.   NOTE: pacer interrogated, no issues with leads  Patient Profile     81 y.o. male with PSVT (144bpm) with unexplained syncope  Assessment & Plan    Syncope  - possibly related to intravascular volume depletion and orthostasis/ neurocardiogenic.   - pacer functioning normally  - SVT noted yesterday but HR 144, would be unusual cause of syncope.   - Would suggest taking flecainide as outpatient (admits to taking on and off)  - CK total was 3500, lactic acid was elevated at 2.36. Albumin low at 2.2. Liver functions were elevated. Likely metabolic source for syncope.  - overall feels better.   No cardiac source identified. Will sign off. Please call if ?Marland Kitchen   Signed, Candee Furbish, MD  06/07/2016, 8:29 AM

## 2016-06-07 NOTE — Progress Notes (Signed)
Jonathon [name] discharged Home with family and H/H per MD order.  Discharge instructions reviewed and discussed with the patient, all questions and concerns answered. Copy of instructions given to patient.  Allergies as of 06/07/2016   No Known Allergies     Medication List    STOP taking these medications   gabapentin 100 MG capsule Commonly known as:  NEURONTIN     TAKE these medications   b complex vitamins tablet Take 1 tablet by mouth daily as needed (for supplement).   feeding supplement (ENSURE ENLIVE) Liqd Take 237 mLs by mouth 2 (two) times daily between meals.   Fish Oil 1000 MG Caps Take 1 capsule by mouth daily as needed (for supplement).   flecainide 150 MG tablet Commonly known as:  TAMBOCOR Take 0.5 tablets (75 mg total) by mouth 2 (two) times daily.   Garlic 123XX123 MG Caps Take 2,000 mg by mouth daily as needed (supplement).   irbesartan 300 MG tablet Commonly known as:  AVAPRO Take 1 tablet (300 mg total) by mouth daily.   metoprolol succinate 25 MG 24 hr tablet Commonly known as:  TOPROL XL Take 1 tablet (25 mg total) by mouth daily.   tamsulosin 0.4 MG Caps capsule Commonly known as:  FLOMAX TAKE 1 CAPSULE BY MOUTH EVERY DAY   VITAMIN B 12 PO Take 1,000 mg by mouth daily as needed (for supplement).   Vitamin D (Ergocalciferol) 50000 units Caps capsule Commonly known as:  DRISDOL Take 1 capsule (50,000 Units total) by mouth every 7 (seven) days.         Patient will be escorted to car by volunteer in a wheelchair,  no distress noted upon discharge.  Jonathon [name], Jonathon [name] C 06/07/2016 12:47 PM

## 2016-06-10 ENCOUNTER — Ambulatory Visit (INDEPENDENT_AMBULATORY_CARE_PROVIDER_SITE_OTHER): Payer: Medicare Other | Admitting: *Deleted

## 2016-06-10 DIAGNOSIS — I495 Sick sinus syndrome: Secondary | ICD-10-CM

## 2016-06-10 LAB — CULTURE, BLOOD (ROUTINE X 2)
CULTURE: NO GROWTH
Culture: NO GROWTH

## 2016-06-10 NOTE — Progress Notes (Signed)
Remote pacemaker transmission.   

## 2016-06-11 ENCOUNTER — Encounter: Payer: Self-pay | Admitting: Cardiology

## 2016-06-11 ENCOUNTER — Telehealth: Payer: Self-pay

## 2016-06-11 NOTE — Telephone Encounter (Signed)
Spoke with pt and he states that he is doing very well. He is using walker for assistance. His son is staying with him for assistance. He denies needing any additional in home help at this time.   Appt scheduled with Dr Raliegh Ip 06/18/16.Marland Kitchen Pt aware.   Transition Care Management Follow-up Telephone Call  How have you been since you were discharged from the hospital? good   Do you understand why you were in the hospital? yes   Do you understand the discharge instrcutions? yes  Items Reviewed:  Medications reviewed: yes  Allergies reviewed: yes  Dietary changes reviewed: yes  Referrals reviewed: yes   Functional Questionnaire:   Activities of Daily Living (ADLs):   He states they are independent in the following: bathing and hygiene, feeding, continence, grooming, toileting and dressing States they require assistance with the following: ambulates with walker   Any transportation issues/concerns?: no   Any patient concerns? no   Confirmed importance and date/time of follow-up visits scheduled: yes   Confirmed with patient if condition begins to worsen call PCP or go to the ER.  Patient was given the Call-a-Nurse line [phone]: yes

## 2016-06-13 LAB — CUP PACEART REMOTE DEVICE CHECK
Battery Remaining Longevity: 125 mo
Battery Voltage: 2.78 V
Brady Statistic AP VP Percent: 2 %
Brady Statistic AS VP Percent: 14 %
Implantable Lead Implant Date: 20050121
Implantable Lead Model: 5076
Implantable Pulse Generator Implant Date: 20140207
Lead Channel Impedance Value: 534 Ohm
Lead Channel Pacing Threshold Amplitude: 1.125 V
Lead Channel Pacing Threshold Pulse Width: 0.4 ms
Lead Channel Setting Pacing Amplitude: 2.25 V
Lead Channel Setting Pacing Pulse Width: 0.4 ms
MDC IDC LEAD IMPLANT DT: 20050121
MDC IDC LEAD LOCATION: 753859
MDC IDC LEAD LOCATION: 753860
MDC IDC MSMT BATTERY IMPEDANCE: 255 Ohm
MDC IDC MSMT LEADCHNL RA PACING THRESHOLD PULSEWIDTH: 0.4 ms
MDC IDC MSMT LEADCHNL RV IMPEDANCE VALUE: 571 Ohm
MDC IDC MSMT LEADCHNL RV PACING THRESHOLD AMPLITUDE: 0.625 V
MDC IDC SESS DTM: 20180129141420
MDC IDC SET LEADCHNL RV PACING AMPLITUDE: 2.5 V
MDC IDC SET LEADCHNL RV SENSING SENSITIVITY: 2 mV
MDC IDC STAT BRADY AP VS PERCENT: 0 %
MDC IDC STAT BRADY AS VS PERCENT: 84 %

## 2016-06-18 ENCOUNTER — Ambulatory Visit (INDEPENDENT_AMBULATORY_CARE_PROVIDER_SITE_OTHER): Payer: Medicare Other | Admitting: Internal Medicine

## 2016-06-18 ENCOUNTER — Encounter: Payer: Self-pay | Admitting: Internal Medicine

## 2016-06-18 VITALS — BP 140/76 | HR 82 | Temp 98.4°F | Ht 71.0 in | Wt 172.4 lb

## 2016-06-18 DIAGNOSIS — T796XXD Traumatic ischemia of muscle, subsequent encounter: Secondary | ICD-10-CM | POA: Diagnosis not present

## 2016-06-18 DIAGNOSIS — R5381 Other malaise: Secondary | ICD-10-CM | POA: Diagnosis not present

## 2016-06-18 DIAGNOSIS — I10 Essential (primary) hypertension: Secondary | ICD-10-CM

## 2016-06-18 DIAGNOSIS — G6181 Chronic inflammatory demyelinating polyneuritis: Secondary | ICD-10-CM

## 2016-06-18 DIAGNOSIS — K760 Fatty (change of) liver, not elsewhere classified: Secondary | ICD-10-CM | POA: Diagnosis not present

## 2016-06-18 DIAGNOSIS — R7989 Other specified abnormal findings of blood chemistry: Secondary | ICD-10-CM

## 2016-06-18 DIAGNOSIS — R55 Syncope and collapse: Secondary | ICD-10-CM | POA: Diagnosis not present

## 2016-06-18 DIAGNOSIS — R945 Abnormal results of liver function studies: Secondary | ICD-10-CM

## 2016-06-18 LAB — CBC WITH DIFFERENTIAL/PLATELET
BASOS ABS: 0.1 10*3/uL (ref 0.0–0.1)
Basophils Relative: 2.5 % (ref 0.0–3.0)
Eosinophils Absolute: 0.1 10*3/uL (ref 0.0–0.7)
Eosinophils Relative: 2.7 % (ref 0.0–5.0)
HEMATOCRIT: 33.7 % — AB (ref 39.0–52.0)
Hemoglobin: 11.2 g/dL — ABNORMAL LOW (ref 13.0–17.0)
LYMPHS ABS: 1.2 10*3/uL (ref 0.7–4.0)
LYMPHS PCT: 25.9 % (ref 12.0–46.0)
MCHC: 33.1 g/dL (ref 30.0–36.0)
MCV: 90.6 fl (ref 78.0–100.0)
Monocytes Absolute: 0.8 10*3/uL (ref 0.1–1.0)
Monocytes Relative: 16.8 % — ABNORMAL HIGH (ref 3.0–12.0)
NEUTROS ABS: 2.4 10*3/uL (ref 1.4–7.7)
NEUTROS PCT: 52.1 % (ref 43.0–77.0)
PLATELETS: 326 10*3/uL (ref 150.0–400.0)
RBC: 3.72 Mil/uL — ABNORMAL LOW (ref 4.22–5.81)
RDW: 14.6 % (ref 11.5–15.5)
WBC: 4.5 10*3/uL (ref 4.0–10.5)

## 2016-06-18 LAB — COMPREHENSIVE METABOLIC PANEL
ALT: 131 U/L — AB (ref 0–53)
AST: 163 U/L — AB (ref 0–37)
Albumin: 3 g/dL — ABNORMAL LOW (ref 3.5–5.2)
Alkaline Phosphatase: 104 U/L (ref 39–117)
BILIRUBIN TOTAL: 1.4 mg/dL — AB (ref 0.2–1.2)
BUN: 7 mg/dL (ref 6–23)
CALCIUM: 8.6 mg/dL (ref 8.4–10.5)
CO2: 26 meq/L (ref 19–32)
Chloride: 103 mEq/L (ref 96–112)
Creatinine, Ser: 0.81 mg/dL (ref 0.40–1.50)
GFR: 118.4 mL/min (ref >60.00)
Glucose, Bld: 94 mg/dL (ref 70–99)
Potassium: 3.7 mEq/L (ref 3.5–5.1)
Sodium: 137 mEq/L (ref 135–145)
Total Protein: 6.7 g/dL (ref 6.0–8.3)

## 2016-06-18 NOTE — Progress Notes (Signed)
Subjective:    Patient ID: Jonathon [name], male    DOB: March 08, 1936, 81 y.o.   MRN: [id number]  HPI  Admit date: 06/05/2016 Discharge date: 06/07/2016  Recommendations for Outpatient Follow-up:  1. F/u with PMD within a week  for hospital discharge follow up, repeat cbc/cmp at follow up 2. F/u with cardiology  Discharge Diagnoses:       Active Hospital Problems   Diagnosis Date Noted  . FUO (fever of unknown origin) 06/05/2016  . Hepatic steatosis 06/05/2016  . Physical deconditioning 06/05/2016  . Chronic diastolic congestive heart failure ([place]) 06/05/2016   81 year old patient who is seen today following a recent hospital discharge and for transitional care management. He apparently had a syncopal episode without warning and spent the night on his bathroom floor and found by his daughter early the following morning.  On arrival to the hospital.  He was febrile and sepsis workup was negative. Hospital records reviewed.  Patient had significant rhabdomyolysis as well.  Has elevated liver function studies.  He does have a history of fatty liver.  He admits to daily alcohol use .  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  Since his discharge, he has done reasonable well without recurrent syncope.  He complains of lower extremity weakness that he feels is improving.  His weight is up modestly.  There is been no recurrent fever  Past Medical History:  Diagnosis Date  . Asthma   . BENIGN PROSTATIC HYPERTROPHY, HX OF 10/25/2008  . BRADYCARDIA 2005  . Chronic diastolic congestive heart failure ([place]) 06/05/2016  . CIDP (chronic inflammatory demyelinating polyneuropathy) ([place])   . HYPERTENSION 11/21/2006  . Iron deficiency anemia, unspecified 04/19/2013  . 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 PACEMAKER, PERMANENT 2005   Gen change 2014 Medtronic Adaptic L dual-chamber pacemaker, serial [id number]   . SVT (supraventricular tachycardia) ([place])   . TOBACCO  ABUSE 10/25/2008   Quit 2012     Social History   Social History  . Marital status: Divorced    Spouse name: N/A  . Number of children: 4  . Years of education: N/A   Occupational History  . [employer]   Social History Main Topics  . Smoking status: Former Smoker    Packs/day: 0.25    Years: 46.00    Types: Cigarettes    Start date: 03/16/1965    Quit date: 06/15/2010  . Smokeless tobacco: Never Used     Comment: quit 3 years ago  . Alcohol use No     Comment: 4 beers per day  . Drug use: No  . Sexual activity: Not on file   Other Topics Concern  . 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    Past Surgical History:  Procedure Laterality Date  . COLONOSCOPY  [id number]  . PACEMAKER INSERTION  2005  . PERMANENT PACEMAKER GENERATOR CHANGE N/A 06/19/2012   Procedure: PERMANENT PACEMAKER GENERATOR CHANGE;  Surgeon: [name], MD; Medtronic Adaptic L dual-chamber pacemaker, serial [id number]    . SKIN GRAFT Right 1962   wrist    Family History  Problem Relation Age of Onset  . Heart attack Father     Died, 54  . Kidney disease Father   . Parkinson's disease Mother     Died, 67  . Breast cancer Sister  Living, 70  . Diabetes type II Brother     Living, 56  . Breast cancer Sister     Living, 43  . Diabetes Daughter     Living, 46  . Diabetes Son     Living, 76  . Ovarian cancer Daughter   . Colon cancer Neg Hx   . Rectal cancer Neg Hx   . Stomach cancer Neg Hx     No Known Allergies  Current Outpatient Prescriptions on File Prior to Visit  Medication Sig Dispense Refill  . b complex vitamins tablet Take 1 tablet by mouth daily as needed (for supplement).     . Cyanocobalamin (VITAMIN B 12 PO) Take 1,000 mg by mouth daily as needed (for supplement).     . feeding supplement, ENSURE ENLIVE, (ENSURE ENLIVE) LIQD Take 237 mLs by  mouth 2 (two) times daily between meals. 237 mL 12  . flecainide (TAMBOCOR) 150 MG tablet Take 0.5 tablets (75 mg total) by mouth 2 (two) times daily. 30 tablet 6  . Garlic 123XX123 MG CAPS Take 2,000 mg by mouth daily as needed (supplement).     . irbesartan (AVAPRO) 300 MG tablet Take 1 tablet (300 mg total) by mouth daily. 90 tablet 3  . metoprolol succinate (TOPROL XL) 25 MG 24 hr tablet Take 1 tablet (25 mg total) by mouth daily. 30 tablet 6  . Omega-3 Fatty Acids (FISH OIL) 1000 MG CAPS Take 1 capsule by mouth daily as needed (for supplement).     . tamsulosin (FLOMAX) 0.4 MG CAPS capsule TAKE 1 CAPSULE BY MOUTH EVERY DAY 90 capsule 3  . Vitamin D, Ergocalciferol, (DRISDOL) 50000 units CAPS capsule Take 1 capsule (50,000 Units total) by mouth every 7 (seven) days. 12 capsule 0   No current facility-administered medications on file prior to visit.     BP 140/76 (BP Location: Right Arm, Patient Position: Sitting, Cuff Size: Normal)   Pulse 82   Temp 98.4 F (36.9 C) (Oral)   Ht 5\' 11"  (1.803 m)   Wt 172 lb 6.4 oz (78.2 kg)   SpO2 97%   BMI 24.04 kg/m     Review of Systems  Constitutional: Positive for activity change, appetite change and fatigue. Negative for chills and fever.  HENT: Negative for congestion, dental problem, ear pain, hearing loss, sore throat, tinnitus, trouble swallowing and voice change.   Eyes: Negative for pain, discharge and visual disturbance.  Respiratory: Negative for cough, chest tightness, wheezing and stridor.   Cardiovascular: Negative for chest pain, palpitations and leg swelling.  Gastrointestinal: Negative for abdominal distention, abdominal pain, blood in stool, constipation, diarrhea, nausea and vomiting.  Genitourinary: Negative for difficulty urinating, discharge, flank pain, genital sores, hematuria and urgency.  Musculoskeletal: Positive for gait problem. Negative for arthralgias, back pain, joint swelling, myalgias and neck stiffness.  Skin:  Negative for rash.  Neurological: Positive for syncope and weakness. Negative for dizziness, speech difficulty, numbness and headaches.  Hematological: Negative for adenopathy. Does not bruise/bleed easily.  Psychiatric/Behavioral: Negative for behavioral problems and dysphoric mood. The patient is not nervous/anxious.        Objective:   Physical Exam  Constitutional: He is oriented to person, place, and time. He appears well-developed.  Blood pressure 140/76 without orthostatange  HENT:  Head: Normocephalic.  Right Ear: External ear normal.  Left Ear: External ear normal.  Eyes: Conjunctivae and EOM are normal.  Neck: Normal range of motion.  Cardiovascular: Normal rate and normal heart sounds.  Pulmonary/Chest: Breath sounds normal.  Abdominal: Bowel sounds are normal.  Musculoskeletal: Normal range of motion. He exhibits no edema or tenderness.  Neurological: He is alert and oriented to person, place, and time.  Lower extremity weakness Uses a cane  Psychiatric: He has a normal mood and affect. His behavior is normal.          Assessment & Plan:   History of syncope in the setting of alcohol use and fever.  No recurrent symptoms History of rhabdomyolysis.  Will check a follow-up CK levels CIDP.  Follow-up neurology History of elevated liver function studies/history of hepatic steatosis.  Will check follow-up LFTs.  May need ultrasound  [facility]

## 2016-06-18 NOTE — Patient Instructions (Signed)
Discontinue alcohol use  Follow-up neurology    It is important that you exercise regularly, at least 20 minutes 3 to 4 times per week.  If you develop chest pain or shortness of breath seek  medical attention.

## 2016-06-18 NOTE — Progress Notes (Signed)
Pre visit review using our clinic review tool, if applicable. No additional management support is needed unless otherwise documented below in the visit note. 

## 2016-06-19 LAB — CK TOTAL AND CKMB (NOT AT ARMC)
CK, MB: 3.7 ng/mL (ref 0.0–5.0)
Relative Index: 4.2 — ABNORMAL HIGH (ref 0.0–4.0)
Total CK: 89 U/L (ref 7–232)

## 2016-06-24 DIAGNOSIS — K760 Fatty (change of) liver, not elsewhere classified: Secondary | ICD-10-CM | POA: Diagnosis not present

## 2016-06-24 DIAGNOSIS — N400 Enlarged prostate without lower urinary tract symptoms: Secondary | ICD-10-CM | POA: Diagnosis not present

## 2016-06-24 DIAGNOSIS — G6181 Chronic inflammatory demyelinating polyneuritis: Secondary | ICD-10-CM | POA: Diagnosis not present

## 2016-06-24 DIAGNOSIS — Z9181 History of falling: Secondary | ICD-10-CM | POA: Diagnosis not present

## 2016-06-24 DIAGNOSIS — Z950 Presence of cardiac pacemaker: Secondary | ICD-10-CM | POA: Diagnosis not present

## 2016-06-24 DIAGNOSIS — J45909 Unspecified asthma, uncomplicated: Secondary | ICD-10-CM | POA: Diagnosis not present

## 2016-06-24 DIAGNOSIS — M6281 Muscle weakness (generalized): Secondary | ICD-10-CM | POA: Diagnosis not present

## 2016-06-24 DIAGNOSIS — I5032 Chronic diastolic (congestive) heart failure: Secondary | ICD-10-CM | POA: Diagnosis not present

## 2016-06-24 DIAGNOSIS — I110 Hypertensive heart disease with heart failure: Secondary | ICD-10-CM | POA: Diagnosis not present

## 2016-06-26 DIAGNOSIS — K760 Fatty (change of) liver, not elsewhere classified: Secondary | ICD-10-CM | POA: Diagnosis not present

## 2016-06-26 DIAGNOSIS — M6281 Muscle weakness (generalized): Secondary | ICD-10-CM | POA: Diagnosis not present

## 2016-06-26 DIAGNOSIS — I5032 Chronic diastolic (congestive) heart failure: Secondary | ICD-10-CM | POA: Diagnosis not present

## 2016-06-26 DIAGNOSIS — I110 Hypertensive heart disease with heart failure: Secondary | ICD-10-CM | POA: Diagnosis not present

## 2016-06-26 DIAGNOSIS — J45909 Unspecified asthma, uncomplicated: Secondary | ICD-10-CM | POA: Diagnosis not present

## 2016-06-26 DIAGNOSIS — G6181 Chronic inflammatory demyelinating polyneuritis: Secondary | ICD-10-CM | POA: Diagnosis not present

## 2016-06-27 DIAGNOSIS — I110 Hypertensive heart disease with heart failure: Secondary | ICD-10-CM | POA: Diagnosis not present

## 2016-06-27 DIAGNOSIS — I5032 Chronic diastolic (congestive) heart failure: Secondary | ICD-10-CM | POA: Diagnosis not present

## 2016-06-27 DIAGNOSIS — K760 Fatty (change of) liver, not elsewhere classified: Secondary | ICD-10-CM | POA: Diagnosis not present

## 2016-06-27 DIAGNOSIS — G6181 Chronic inflammatory demyelinating polyneuritis: Secondary | ICD-10-CM | POA: Diagnosis not present

## 2016-06-27 DIAGNOSIS — M6281 Muscle weakness (generalized): Secondary | ICD-10-CM | POA: Diagnosis not present

## 2016-06-27 DIAGNOSIS — J45909 Unspecified asthma, uncomplicated: Secondary | ICD-10-CM | POA: Diagnosis not present

## 2016-06-28 DIAGNOSIS — M6281 Muscle weakness (generalized): Secondary | ICD-10-CM | POA: Diagnosis not present

## 2016-06-28 DIAGNOSIS — K760 Fatty (change of) liver, not elsewhere classified: Secondary | ICD-10-CM | POA: Diagnosis not present

## 2016-06-28 DIAGNOSIS — I110 Hypertensive heart disease with heart failure: Secondary | ICD-10-CM | POA: Diagnosis not present

## 2016-06-28 DIAGNOSIS — J45909 Unspecified asthma, uncomplicated: Secondary | ICD-10-CM | POA: Diagnosis not present

## 2016-06-28 DIAGNOSIS — I5032 Chronic diastolic (congestive) heart failure: Secondary | ICD-10-CM | POA: Diagnosis not present

## 2016-06-28 DIAGNOSIS — G6181 Chronic inflammatory demyelinating polyneuritis: Secondary | ICD-10-CM | POA: Diagnosis not present

## 2016-07-01 DIAGNOSIS — J45909 Unspecified asthma, uncomplicated: Secondary | ICD-10-CM | POA: Diagnosis not present

## 2016-07-01 DIAGNOSIS — M6281 Muscle weakness (generalized): Secondary | ICD-10-CM | POA: Diagnosis not present

## 2016-07-01 DIAGNOSIS — I5032 Chronic diastolic (congestive) heart failure: Secondary | ICD-10-CM | POA: Diagnosis not present

## 2016-07-01 DIAGNOSIS — K760 Fatty (change of) liver, not elsewhere classified: Secondary | ICD-10-CM | POA: Diagnosis not present

## 2016-07-01 DIAGNOSIS — I110 Hypertensive heart disease with heart failure: Secondary | ICD-10-CM | POA: Diagnosis not present

## 2016-07-01 DIAGNOSIS — G6181 Chronic inflammatory demyelinating polyneuritis: Secondary | ICD-10-CM | POA: Diagnosis not present

## 2016-07-02 DIAGNOSIS — J45909 Unspecified asthma, uncomplicated: Secondary | ICD-10-CM | POA: Diagnosis not present

## 2016-07-02 DIAGNOSIS — I5032 Chronic diastolic (congestive) heart failure: Secondary | ICD-10-CM | POA: Diagnosis not present

## 2016-07-02 DIAGNOSIS — I110 Hypertensive heart disease with heart failure: Secondary | ICD-10-CM | POA: Diagnosis not present

## 2016-07-02 DIAGNOSIS — M6281 Muscle weakness (generalized): Secondary | ICD-10-CM | POA: Diagnosis not present

## 2016-07-02 DIAGNOSIS — G6181 Chronic inflammatory demyelinating polyneuritis: Secondary | ICD-10-CM | POA: Diagnosis not present

## 2016-07-02 DIAGNOSIS — K760 Fatty (change of) liver, not elsewhere classified: Secondary | ICD-10-CM | POA: Diagnosis not present

## 2016-07-03 DIAGNOSIS — I110 Hypertensive heart disease with heart failure: Secondary | ICD-10-CM | POA: Diagnosis not present

## 2016-07-03 DIAGNOSIS — K760 Fatty (change of) liver, not elsewhere classified: Secondary | ICD-10-CM | POA: Diagnosis not present

## 2016-07-03 DIAGNOSIS — M6281 Muscle weakness (generalized): Secondary | ICD-10-CM | POA: Diagnosis not present

## 2016-07-03 DIAGNOSIS — G6181 Chronic inflammatory demyelinating polyneuritis: Secondary | ICD-10-CM | POA: Diagnosis not present

## 2016-07-03 DIAGNOSIS — I5032 Chronic diastolic (congestive) heart failure: Secondary | ICD-10-CM | POA: Diagnosis not present

## 2016-07-03 DIAGNOSIS — J45909 Unspecified asthma, uncomplicated: Secondary | ICD-10-CM | POA: Diagnosis not present

## 2016-07-04 DIAGNOSIS — M6281 Muscle weakness (generalized): Secondary | ICD-10-CM | POA: Diagnosis not present

## 2016-07-04 DIAGNOSIS — G6181 Chronic inflammatory demyelinating polyneuritis: Secondary | ICD-10-CM | POA: Diagnosis not present

## 2016-07-04 DIAGNOSIS — I110 Hypertensive heart disease with heart failure: Secondary | ICD-10-CM | POA: Diagnosis not present

## 2016-07-04 DIAGNOSIS — J45909 Unspecified asthma, uncomplicated: Secondary | ICD-10-CM | POA: Diagnosis not present

## 2016-07-04 DIAGNOSIS — K760 Fatty (change of) liver, not elsewhere classified: Secondary | ICD-10-CM | POA: Diagnosis not present

## 2016-07-04 DIAGNOSIS — I5032 Chronic diastolic (congestive) heart failure: Secondary | ICD-10-CM | POA: Diagnosis not present

## 2016-07-05 DIAGNOSIS — I110 Hypertensive heart disease with heart failure: Secondary | ICD-10-CM | POA: Diagnosis not present

## 2016-07-05 DIAGNOSIS — G6181 Chronic inflammatory demyelinating polyneuritis: Secondary | ICD-10-CM | POA: Diagnosis not present

## 2016-07-05 DIAGNOSIS — I5032 Chronic diastolic (congestive) heart failure: Secondary | ICD-10-CM | POA: Diagnosis not present

## 2016-07-05 DIAGNOSIS — J45909 Unspecified asthma, uncomplicated: Secondary | ICD-10-CM | POA: Diagnosis not present

## 2016-07-05 DIAGNOSIS — K760 Fatty (change of) liver, not elsewhere classified: Secondary | ICD-10-CM | POA: Diagnosis not present

## 2016-07-05 DIAGNOSIS — M6281 Muscle weakness (generalized): Secondary | ICD-10-CM | POA: Diagnosis not present

## 2016-07-08 DIAGNOSIS — I110 Hypertensive heart disease with heart failure: Secondary | ICD-10-CM | POA: Diagnosis not present

## 2016-07-08 DIAGNOSIS — J45909 Unspecified asthma, uncomplicated: Secondary | ICD-10-CM | POA: Diagnosis not present

## 2016-07-08 DIAGNOSIS — K760 Fatty (change of) liver, not elsewhere classified: Secondary | ICD-10-CM | POA: Diagnosis not present

## 2016-07-08 DIAGNOSIS — M6281 Muscle weakness (generalized): Secondary | ICD-10-CM | POA: Diagnosis not present

## 2016-07-08 DIAGNOSIS — G6181 Chronic inflammatory demyelinating polyneuritis: Secondary | ICD-10-CM | POA: Diagnosis not present

## 2016-07-08 DIAGNOSIS — I119 Hypertensive heart disease without heart failure: Secondary | ICD-10-CM | POA: Diagnosis not present

## 2016-07-08 DIAGNOSIS — I5032 Chronic diastolic (congestive) heart failure: Secondary | ICD-10-CM | POA: Diagnosis not present

## 2016-07-09 DIAGNOSIS — J45909 Unspecified asthma, uncomplicated: Secondary | ICD-10-CM | POA: Diagnosis not present

## 2016-07-09 DIAGNOSIS — I5032 Chronic diastolic (congestive) heart failure: Secondary | ICD-10-CM | POA: Diagnosis not present

## 2016-07-09 DIAGNOSIS — I110 Hypertensive heart disease with heart failure: Secondary | ICD-10-CM | POA: Diagnosis not present

## 2016-07-09 DIAGNOSIS — M6281 Muscle weakness (generalized): Secondary | ICD-10-CM | POA: Diagnosis not present

## 2016-07-09 DIAGNOSIS — G6181 Chronic inflammatory demyelinating polyneuritis: Secondary | ICD-10-CM | POA: Diagnosis not present

## 2016-07-09 DIAGNOSIS — K760 Fatty (change of) liver, not elsewhere classified: Secondary | ICD-10-CM | POA: Diagnosis not present

## 2016-07-10 DIAGNOSIS — I5032 Chronic diastolic (congestive) heart failure: Secondary | ICD-10-CM | POA: Diagnosis not present

## 2016-07-10 DIAGNOSIS — I110 Hypertensive heart disease with heart failure: Secondary | ICD-10-CM | POA: Diagnosis not present

## 2016-07-10 DIAGNOSIS — J45909 Unspecified asthma, uncomplicated: Secondary | ICD-10-CM | POA: Diagnosis not present

## 2016-07-10 DIAGNOSIS — K760 Fatty (change of) liver, not elsewhere classified: Secondary | ICD-10-CM | POA: Diagnosis not present

## 2016-07-10 DIAGNOSIS — M6281 Muscle weakness (generalized): Secondary | ICD-10-CM | POA: Diagnosis not present

## 2016-07-10 DIAGNOSIS — G6181 Chronic inflammatory demyelinating polyneuritis: Secondary | ICD-10-CM | POA: Diagnosis not present

## 2016-07-12 DIAGNOSIS — K760 Fatty (change of) liver, not elsewhere classified: Secondary | ICD-10-CM | POA: Diagnosis not present

## 2016-07-12 DIAGNOSIS — I110 Hypertensive heart disease with heart failure: Secondary | ICD-10-CM | POA: Diagnosis not present

## 2016-07-12 DIAGNOSIS — M6281 Muscle weakness (generalized): Secondary | ICD-10-CM | POA: Diagnosis not present

## 2016-07-12 DIAGNOSIS — G6181 Chronic inflammatory demyelinating polyneuritis: Secondary | ICD-10-CM | POA: Diagnosis not present

## 2016-07-12 DIAGNOSIS — J45909 Unspecified asthma, uncomplicated: Secondary | ICD-10-CM | POA: Diagnosis not present

## 2016-07-12 DIAGNOSIS — I5032 Chronic diastolic (congestive) heart failure: Secondary | ICD-10-CM | POA: Diagnosis not present

## 2016-07-13 ENCOUNTER — Other Ambulatory Visit: Payer: Self-pay | Admitting: Internal Medicine

## 2016-07-15 DIAGNOSIS — G6181 Chronic inflammatory demyelinating polyneuritis: Secondary | ICD-10-CM | POA: Diagnosis not present

## 2016-07-15 DIAGNOSIS — K760 Fatty (change of) liver, not elsewhere classified: Secondary | ICD-10-CM | POA: Diagnosis not present

## 2016-07-15 DIAGNOSIS — I110 Hypertensive heart disease with heart failure: Secondary | ICD-10-CM | POA: Diagnosis not present

## 2016-07-15 DIAGNOSIS — M6281 Muscle weakness (generalized): Secondary | ICD-10-CM | POA: Diagnosis not present

## 2016-07-15 DIAGNOSIS — I5032 Chronic diastolic (congestive) heart failure: Secondary | ICD-10-CM | POA: Diagnosis not present

## 2016-07-15 DIAGNOSIS — J45909 Unspecified asthma, uncomplicated: Secondary | ICD-10-CM | POA: Diagnosis not present

## 2016-07-18 DIAGNOSIS — G6181 Chronic inflammatory demyelinating polyneuritis: Secondary | ICD-10-CM | POA: Diagnosis not present

## 2016-07-18 DIAGNOSIS — I5032 Chronic diastolic (congestive) heart failure: Secondary | ICD-10-CM | POA: Diagnosis not present

## 2016-07-18 DIAGNOSIS — I110 Hypertensive heart disease with heart failure: Secondary | ICD-10-CM | POA: Diagnosis not present

## 2016-07-18 DIAGNOSIS — J45909 Unspecified asthma, uncomplicated: Secondary | ICD-10-CM | POA: Diagnosis not present

## 2016-07-18 DIAGNOSIS — K760 Fatty (change of) liver, not elsewhere classified: Secondary | ICD-10-CM | POA: Diagnosis not present

## 2016-07-18 DIAGNOSIS — M6281 Muscle weakness (generalized): Secondary | ICD-10-CM | POA: Diagnosis not present

## 2016-07-22 DIAGNOSIS — M6281 Muscle weakness (generalized): Secondary | ICD-10-CM | POA: Diagnosis not present

## 2016-07-22 DIAGNOSIS — I110 Hypertensive heart disease with heart failure: Secondary | ICD-10-CM | POA: Diagnosis not present

## 2016-07-22 DIAGNOSIS — J45909 Unspecified asthma, uncomplicated: Secondary | ICD-10-CM | POA: Diagnosis not present

## 2016-07-22 DIAGNOSIS — G6181 Chronic inflammatory demyelinating polyneuritis: Secondary | ICD-10-CM | POA: Diagnosis not present

## 2016-07-22 DIAGNOSIS — K760 Fatty (change of) liver, not elsewhere classified: Secondary | ICD-10-CM | POA: Diagnosis not present

## 2016-07-22 DIAGNOSIS — I5032 Chronic diastolic (congestive) heart failure: Secondary | ICD-10-CM | POA: Diagnosis not present

## 2016-08-01 DIAGNOSIS — K760 Fatty (change of) liver, not elsewhere classified: Secondary | ICD-10-CM | POA: Diagnosis not present

## 2016-08-01 DIAGNOSIS — I110 Hypertensive heart disease with heart failure: Secondary | ICD-10-CM | POA: Diagnosis not present

## 2016-08-01 DIAGNOSIS — M6281 Muscle weakness (generalized): Secondary | ICD-10-CM | POA: Diagnosis not present

## 2016-08-01 DIAGNOSIS — I5032 Chronic diastolic (congestive) heart failure: Secondary | ICD-10-CM | POA: Diagnosis not present

## 2016-08-01 DIAGNOSIS — G6181 Chronic inflammatory demyelinating polyneuritis: Secondary | ICD-10-CM | POA: Diagnosis not present

## 2016-08-01 DIAGNOSIS — J45909 Unspecified asthma, uncomplicated: Secondary | ICD-10-CM | POA: Diagnosis not present

## 2016-08-02 ENCOUNTER — Telehealth: Payer: Self-pay | Admitting: Internal Medicine

## 2016-08-02 NOTE — Telephone Encounter (Signed)
Jonathon [name] with [facility] would like dr to know yesterday afternoon the pt's  BP was 158/100.  She knows the nurse will be checking on pt next week.

## 2016-08-05 DIAGNOSIS — I110 Hypertensive heart disease with heart failure: Secondary | ICD-10-CM | POA: Diagnosis not present

## 2016-08-05 DIAGNOSIS — K760 Fatty (change of) liver, not elsewhere classified: Secondary | ICD-10-CM | POA: Diagnosis not present

## 2016-08-05 DIAGNOSIS — G6181 Chronic inflammatory demyelinating polyneuritis: Secondary | ICD-10-CM | POA: Diagnosis not present

## 2016-08-05 DIAGNOSIS — M6281 Muscle weakness (generalized): Secondary | ICD-10-CM | POA: Diagnosis not present

## 2016-08-05 DIAGNOSIS — I5032 Chronic diastolic (congestive) heart failure: Secondary | ICD-10-CM | POA: Diagnosis not present

## 2016-08-05 DIAGNOSIS — J45909 Unspecified asthma, uncomplicated: Secondary | ICD-10-CM | POA: Diagnosis not present

## 2016-08-22 DIAGNOSIS — I5032 Chronic diastolic (congestive) heart failure: Secondary | ICD-10-CM | POA: Diagnosis not present

## 2016-08-22 DIAGNOSIS — J45909 Unspecified asthma, uncomplicated: Secondary | ICD-10-CM | POA: Diagnosis not present

## 2016-08-22 DIAGNOSIS — I110 Hypertensive heart disease with heart failure: Secondary | ICD-10-CM | POA: Diagnosis not present

## 2016-08-22 DIAGNOSIS — M6281 Muscle weakness (generalized): Secondary | ICD-10-CM | POA: Diagnosis not present

## 2016-08-22 DIAGNOSIS — K760 Fatty (change of) liver, not elsewhere classified: Secondary | ICD-10-CM | POA: Diagnosis not present

## 2016-08-22 DIAGNOSIS — G6181 Chronic inflammatory demyelinating polyneuritis: Secondary | ICD-10-CM | POA: Diagnosis not present

## 2016-09-09 ENCOUNTER — Telehealth: Payer: Self-pay | Admitting: Cardiology

## 2016-09-09 ENCOUNTER — Ambulatory Visit (INDEPENDENT_AMBULATORY_CARE_PROVIDER_SITE_OTHER): Payer: Medicare Other | Admitting: *Deleted

## 2016-09-09 DIAGNOSIS — I495 Sick sinus syndrome: Secondary | ICD-10-CM

## 2016-09-09 NOTE — Telephone Encounter (Signed)
LMOVM reminding pt to send remote transmission.   

## 2016-09-09 NOTE — Progress Notes (Signed)
Remote pacemaker transmission.   

## 2016-09-10 ENCOUNTER — Encounter: Payer: Self-pay | Admitting: Cardiology

## 2016-09-10 LAB — CUP PACEART REMOTE DEVICE CHECK
Battery Remaining Longevity: 118 mo
Battery Voltage: 2.78 V
Brady Statistic AS VP Percent: 37 %
Date Time Interrogation Session: 20180430182857
Implantable Lead Implant Date: 20050121
Implantable Lead Location: 753860
Implantable Lead Model: 5076
Lead Channel Pacing Threshold Amplitude: 0.5 V
Lead Channel Pacing Threshold Amplitude: 0.75 V
Lead Channel Pacing Threshold Pulse Width: 0.4 ms
Lead Channel Setting Pacing Amplitude: 2 V
Lead Channel Setting Pacing Pulse Width: 0.4 ms
Lead Channel Setting Sensing Sensitivity: 2 mV
MDC IDC LEAD IMPLANT DT: 20050121
MDC IDC LEAD LOCATION: 753859
MDC IDC MSMT BATTERY IMPEDANCE: 278 Ohm
MDC IDC MSMT LEADCHNL RA IMPEDANCE VALUE: 495 Ohm
MDC IDC MSMT LEADCHNL RA PACING THRESHOLD PULSEWIDTH: 0.4 ms
MDC IDC MSMT LEADCHNL RV IMPEDANCE VALUE: 613 Ohm
MDC IDC PG IMPLANT DT: 20140207
MDC IDC SET LEADCHNL RV PACING AMPLITUDE: 2.5 V
MDC IDC STAT BRADY AP VP PERCENT: 1 %
MDC IDC STAT BRADY AP VS PERCENT: 0 %
MDC IDC STAT BRADY AS VS PERCENT: 62 %

## 2016-10-04 ENCOUNTER — Encounter: Payer: Self-pay | Admitting: Family Medicine

## 2016-10-04 ENCOUNTER — Ambulatory Visit (INDEPENDENT_AMBULATORY_CARE_PROVIDER_SITE_OTHER): Payer: Medicare Other | Admitting: Family Medicine

## 2016-10-04 VITALS — BP 150/100 | HR 95 | Temp 98.8°F

## 2016-10-04 DIAGNOSIS — M25462 Effusion, left knee: Secondary | ICD-10-CM | POA: Diagnosis not present

## 2016-10-04 DIAGNOSIS — M25561 Pain in right knee: Secondary | ICD-10-CM

## 2016-10-04 DIAGNOSIS — M25562 Pain in left knee: Secondary | ICD-10-CM | POA: Diagnosis not present

## 2016-10-04 NOTE — Progress Notes (Signed)
Subjective:     Patient ID: Jonathon [name], male   DOB: [birth year], 81 y.o.   MRN: [id number]  HPI Patient is here with approximately one-week history of swollen, painful, red, hot left knee. He denies any recent injury. He denies any history of gout. No history of diabetes. Patient states symptoms have progressed over the week. Denies any fever or chills.  Now has pain with flexion or extension of knee.  Increasing difficulty ambulating over the week.  He has chronic problems including history of CIDP, hypertension, chronic diastolic heart failure, kidney stones, BPH, symptomatic bradycardia  Past Medical History:  Diagnosis Date  . Asthma   . BENIGN PROSTATIC HYPERTROPHY, HX OF 10/25/2008  . BRADYCARDIA 2005  . Chronic diastolic congestive heart failure ([place]) 06/05/2016  . CIDP (chronic inflammatory demyelinating polyneuropathy) ([place])   . HYPERTENSION 11/21/2006  . Iron deficiency anemia, unspecified 04/19/2013  . 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 PACEMAKER, PERMANENT 2005   Gen change 2014 Medtronic Adaptic L dual-chamber pacemaker, serial [id number]   . SVT (supraventricular tachycardia) ([place])   . TOBACCO ABUSE 10/25/2008   Quit 2012   Past Surgical History:  Procedure Laterality Date  . COLONOSCOPY  [id number]  . PACEMAKER INSERTION  2005  . PERMANENT PACEMAKER GENERATOR CHANGE N/A 06/19/2012   Procedure: PERMANENT PACEMAKER GENERATOR CHANGE;  Surgeon: [name], MD; Medtronic Adaptic L dual-chamber pacemaker, serial #[id number]    . SKIN GRAFT Right 1962   wrist    reports that he quit smoking about 6 years ago. His smoking use included Cigarettes. He started smoking about 51 years ago. He has a 11.50 pack-year smoking history. He has never used smokeless tobacco. He reports that he does not drink alcohol or use drugs. family history includes Breast cancer in his sister and sister; Diabetes in his daughter and son; Diabetes type II in his brother;  Heart attack in his father; Kidney disease in his father; Ovarian cancer in his daughter; Parkinson's disease in his mother. No Known Allergies   Review of Systems  Constitutional: Negative for chills and fever.  Gastrointestinal: Negative for nausea and vomiting.  Hematological: Negative for adenopathy.       Objective:   Physical Exam  Constitutional: He appears well-developed and well-nourished.  Cardiovascular: Normal rate.   Pulmonary/Chest: Effort normal and breath sounds normal. No respiratory distress. He has no wheezes. He has no rales.  Musculoskeletal:  Patient has diffuse erythema left knee. He has some increased warmth and diffuse tenderness. No prepatellar effusion. No obvious breaks in the skin or any abrasions.    Neurological: He is alert.       Assessment:     Acute inflammatory changes of right knee.  Rule out septic arthritis.      Plan:     -pt was referred to the orthopedic acute walk in clinic for further evaluation  Eulas Post MD [facility]

## 2016-10-05 DIAGNOSIS — M71162 Other infective bursitis, left knee: Secondary | ICD-10-CM | POA: Diagnosis not present

## 2016-10-14 DIAGNOSIS — M25562 Pain in left knee: Secondary | ICD-10-CM | POA: Diagnosis not present

## 2016-10-14 DIAGNOSIS — M71162 Other infective bursitis, left knee: Secondary | ICD-10-CM | POA: Diagnosis not present

## 2016-10-21 DIAGNOSIS — M71162 Other infective bursitis, left knee: Secondary | ICD-10-CM | POA: Diagnosis not present

## 2016-10-25 ENCOUNTER — Ambulatory Visit: Payer: Medicare Other | Admitting: Neurology

## 2016-10-29 ENCOUNTER — Ambulatory Visit: Payer: Medicare Other | Admitting: Internal Medicine

## 2016-11-07 ENCOUNTER — Other Ambulatory Visit: Payer: Self-pay | Admitting: Internal Medicine

## 2016-12-09 ENCOUNTER — Encounter: Payer: Medicare Other | Admitting: *Deleted

## 2016-12-09 ENCOUNTER — Encounter: Payer: Self-pay | Admitting: Neurology

## 2016-12-09 ENCOUNTER — Ambulatory Visit (INDEPENDENT_AMBULATORY_CARE_PROVIDER_SITE_OTHER): Payer: Medicare Other | Admitting: Neurology

## 2016-12-09 ENCOUNTER — Telehealth: Payer: Self-pay | Admitting: Cardiology

## 2016-12-09 VITALS — BP 130/88 | HR 88 | Ht 71.0 in | Wt 174.0 lb

## 2016-12-09 DIAGNOSIS — Z789 Other specified health status: Secondary | ICD-10-CM

## 2016-12-09 DIAGNOSIS — G8220 Paraplegia, unspecified: Secondary | ICD-10-CM

## 2016-12-09 DIAGNOSIS — Z7289 Other problems related to lifestyle: Secondary | ICD-10-CM

## 2016-12-09 DIAGNOSIS — G6181 Chronic inflammatory demyelinating polyneuritis: Secondary | ICD-10-CM

## 2016-12-09 NOTE — Patient Instructions (Addendum)
We will restart IVIG and call you with the details Encouraged to cutback on alcohol intake Return to clinic in 4 months

## 2016-12-09 NOTE — Telephone Encounter (Signed)
LMOVM reminding pt to send remote transmission.   

## 2016-12-09 NOTE — Progress Notes (Signed)
[facility]  Follow-up Visit   Date: 12/09/16   Jonathon [name] MRN: [id number] DOB: November 17, 1935   Interim History: Jonathon [name] is a 81 y.o. 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  He is here with his sister.    History of present illness: Patient was in his usual state of health until 2013. He has always been physical active and would go to the gym daily (walking up 10 miles/day). During the spring of 2013, he developed relatively subacute onset of left > right hamstring soreness without an inciting event. There is associated proximal leg weakness and loss of muscle mass. Functionally, he is still able to complete all his usual activities, but has became much more cautious when climbing stairs.  In September 2014, he underwent NCS/EMG which did not show any myopathy or radiculopathy of the left lower extremity.  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  CK, aldolase, B12 was within normal limits. SPEP/UPEP shows monoclonal IgA kappa gammopathy and he underwent extensive hematologic testing, including bone marrow biopsy by Dr. Marin Olp which was nondiagnostic.    In 2015, he continued to have progressive loss of muscle bulk in the hands and proximal legs. He has difficulty with climbing stairs and opening jars, and dyspnea on exertion. 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   He reports previously drinking 4 beers/day and on the weekends up to 6 beers/day. 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  GM1 and anti-MAG antibody is negative. There is evidence of severe multilevel spinal stenosis of the cervical spine, and milder findings in the lumbar region.  CSF  studies shows increase oligoclonal bands with normal cell count, IgG index, and myelin basic proteins.  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   He completed 76-month of monthly IVMP and reports little benefit over the past few months, but no worsening.   He stopped working in spring of 2016. Because of progressive weakness, he also stopped weightlifting at the gym.  [facility] sync[facility] spell secondary to orthostatic hypotension.  He has since been started on mestinon and is using compression stockings.   UPDATE 09/08/2015:  Patient was last seen 1 year ago and reports being stable with respect to his neuropathy until early April 2017.  In fact, he was doing much better and was ambulating independently, back at the gym, and gained weight, too.  A few weeks ago, he was using a push mower to cut the lawn and the following morning developed acute onset of bilateral leg weakness, describing his legs as "rubber".  He has to start using his rollator again and became too unsteady to shower at home, so [facility]  He has suffered about 3 falls, but no significant injuries.    UPDATE 11/24/2015:  He reports having two fainting spells, one while at PT and the second [facility]  He reports feeling very lightheaded and sweating prior to these spells.  He has been staying much better hydrated since this time.  He continues to walk with a walker and leg  weakness tends to fluctuates with having good days and bad days.  No new neurological complaints.   UPDATE 03/08/2016:  Patient was scheduled for a sooner appointment because earlier this week, he had to cancel his IVIG infusion after awakening with severe weakness where he was unable to stand.  Over the past 2-3 days, he has regained his  strength saw him and is able to stand and walk with walker. He does require help for more day-to-day things.  He completed his induction dose of IVIG and has not noticed any benefit. He unfortunately suffered a fall a few weeks ago in the bathtub, and could not seek help until the day and a half later when his daughter came to check on him.  UPDATE 12/09/2016:  Patient has not been seen in 9 months and self-discontinued IVIG in October 2017 because he did not feel that it was helping at the time, but now states that it may have been beneficial.  He has noticed progressive muscle atrophy of the legs.  He has one hospitalization for syncope and fall where he was on found by his daughter in the bathroom.  He has not suffered a fall in the past 6 months.  He uses a walker and cane at home, and wheelchair only for long distances.  His daughter mentions that his greatest difficulty is raising his legs to get into the shower.  He continues to drink (1) 20oz beer daily.  He has lost 5lb in the past year and has been adding ensure to his diet daily.  He denies any new pain or paresthesias.  Medications:  Current Outpatient Prescriptions on File Prior to Visit  Medication Sig Dispense Refill  . b complex vitamins tablet Take 1 tablet by mouth daily as needed (for supplement).     . Cyanocobalamin (VITAMIN B 12 PO) Take 1,000 mg by mouth daily as needed (for supplement).     . feeding supplement, ENSURE ENLIVE, (ENSURE ENLIVE) LIQD Take 237 mLs by mouth 2 (two) times daily between meals. 237 mL 12  . flecainide (TAMBOCOR) 150 MG tablet TAKE 1/2 TABLET BY MOUTH TWICE DAILY 30 tablet 3  . Garlic 6301 MG CAPS Take 2,000 mg by mouth daily as needed (supplement).     . irbesartan (AVAPRO) 300 MG tablet Take 1 tablet (300 mg total) by mouth daily. 90 tablet 3  . metoprolol succinate (TOPROL XL) 25 MG 24 hr tablet Take 1 tablet (25 mg total) by mouth daily. 30 tablet 6  . Omega-3 Fatty Acids (FISH OIL) 1000 MG CAPS Take  1 capsule by mouth daily as needed (for supplement).     . tamsulosin (FLOMAX) 0.4 MG CAPS capsule TAKE 1 CAPSULE BY MOUTH EVERY DAY 90 capsule 0  . Vitamin D, Ergocalciferol, (DRISDOL) 50000 units CAPS capsule Take 1 capsule (50,000 Units total) by mouth every 7 (seven) days. 12 capsule 0   No current facility-administered medications on file prior to visit.     Allergies: No Known Allergies   Review of Systems:  CONSTITUTIONAL: No fevers, chills, night sweats EYES: No visual changes or eye pain ENT: No hearing changes.  No history of nose bleeds.   RESPIRATORY: No cough, wheezing and shortness of breath.   CARDIOVASCULAR: Negative for chest pain, and palpitations.   GI: Negative for abdominal discomfort, blood in stools or black stools.  No recent change in bowel habits.   GU:  No history of incontinence.   MUSCLOSKELETAL: No history of joint pain  or swelling.  No myalgias.   SKIN: Negative for lesions, rash, and itching.   HEMATOLOGY/ONCOLOGY: Negative for prolonged bleeding, bruising easily, and swollen nodes.  ENDOCRINE: Negative for cold or heat intolerance, polydipsia or goiter.   PSYCH:  +depression or anxiety symptoms.   NEURO: As Above.   Vital Signs:  BP 130/88   Pulse 88   Ht '5\' 11"'  (1.803 m)   Wt 174 lb (78.9 kg)   SpO2 97%   BMI 24.27 kg/m   General:  Well appearing, no acute distress  Neurological Exam: MENTAL STATUS including orientation to time, place, person, recent and remote memory, attention span and concentration, language, and fund of knowledge is normal.  Speech is not dysarthric.  CRANIAL NERVES:  Pupils are round and reactive to light.  Normal conjugate, extra-ocular eye movements in all directions of gaze. Mild bilateral ptosis.  Face is symmetric.  Palate elevates symmetrically.  Normal shoulder shrug and head rotation.  Normal tongue strength and range of motion, no deviation or fasciculation.  MOTOR:  Bilateral severe ADM, ABP and marked  bilateral quadriceps and FDI atrophy.  No fasciculations.  No tremor or pronator drift.  Neck flexion and extension is 5/5  Right Upper Extremity:    Left Upper Extremity:    Deltoid  5/5   Deltoid  5/5   Biceps  5/5   Biceps  5/5   Triceps  5/5   Triceps  5/5   Wrist extensors  5/5   Wrist extensors  5/5   Wrist flexors  5/5   Wrist flexors  5/5   Finger extensors  5/5   Finger extensors  5/5   Finger flexors  5/5   Finger flexors  5/5   Dorsal interossei  4/5   Dorsal interossei  4/5   Abductor pollicis  4+/5   Abductor pollicis  4+/5   Tone (Ashworth scale)  0  Tone (Ashworth scale)  0   Right Lower Extremity:    Left Lower Extremity:    Hip flexors  4+/5   Hip flexors  4+/5   Hip extensors  5/5   Hip extensors  5/5   Knee flexors  5/5   Knee flexors  5/5   Knee extensors  5/5   Knee extensors  5/5   Dorsiflexors  5/5   Dorsiflexors  5/5   Plantarflexors  5/5   Plantarflexors  5/5   Toe extensors  5/5   Toe extensors  5/5   Toe flexors  5/5   Toe flexors  5/5   Tone (Ashworth scale)  0  Tone (Ashworth scale)  0   MSRs:  Reflexes are 2+/4 in upper extremities, 1+ patella, and absent at Achilles.    SENSORY: Vibration and temperature reduced distal to left ankle. Pin prick is intact.   COORDINATION/GAIT:  Able to rise from chair without using arms. He is able to stand and walk unassisted for short-distance.  Data: Labs 01/13/2013:  CK 55, aldolase 7.9, TSH 0.39, copper 97, ceruloplasmin 30 Labs 07/2013:  vitamin  B12 519, ANA neg, RF neg, ESR 5 Labs 08/10/2013:  CK 91, aldolase 14.9*, GM1 antibody - neg Labs 11/09/2013:  VEGF 118* (normal 31-86) Labs 06/02/2014:  VEGF <31 Labs 08/23/2014:  Vitamin B12 643, folate 18.6, CK 72  CSF 09/24/2013:  R1 W0 G59 Protein 42, IgG index 4.4, MBP < 2.0, WNV - neg, cytology - neg, OCB - 3 well defined bands present in CSF and serum, more prominent in  CSF  EMG 01/25/2013:   1. Left median neuropathy, at or distal to the wrist (carpal tunnel  syndrome), moderate in degree electrically and predominately affecting motor fibers.  2. Left ulnar neuropathy at the elbow, moderately severe in degree electrically. A superimposed C8 motor radiculopathy cannot be excluded. 3. No evidence of diffuse motor axon loss or a generalized myopathy.  EMG 09/16/2013:  There is electrophysiological evidence of a chronic generalized sensorimotor polyradiculoneuropathy, axonal loss and demyelinating in type, affecting the left side. When compared to his previous EMG dated 01/25/2013, there is an interval worsening of findings.  CT cervical spine and lumbar spine 08/17/2013: 1. Advanced degenerative changes in the cervical spine, including multilevel bulky anterior endplate osteophytes.  2. Suspect multifactorial moderate to severe cervical spinal stenosis at the C5-C6 and C6-C7 levels. As the patient is not a  candidate for MRI, cervical spine CT myelogram would confirm.  3. Associated severe multifactorial neural foraminal stenosis at the right C4, right C5, right C6, bilateral C7, and bilateral C8 nerve levels.  4. Mild for age lumbar spine degenerative changes, chiefly facet arthropathy. Up to mild multifactorial spinal stenosis at L2-L3 and L3-L4.   Labs showed monoclonal IgA kappa gammopathy on serum and saw Dr. Marin Olp, who also performed bone marrow biopsy which was negative.  He also has transaminatis likely due to fatty liver.  The possibility of POEMS was raised given elevated VEGF, but repeat VEGF from 05/2014 returned normal.  CT lumbar and cervical spine 03/27/2016: Facet and disc degeneration throughout the lumbar spine. Mild spinal stenosis at L3-4 and L4-5 and L5-S1. No disc protrusion or foraminal encroachment.  Bilateral SI joint degenerative change.   C3-4: Asymmetric RIGHT-sided facet arthropathy and uncinate spurring contributes to RIGHT C4 foraminal narrowing.  C4-5: Marked anterior osteophytosis. Posterior disc protrusion. Asymmetric  facet arthropathy on the RIGHT and asymmetric RIGHT-sided uncinate hypertrophy could result in RIGHT C5 foraminal narrowing.  C5-6: Disc space narrowing. Marked anterior osteophytosis. Osseous spurring and facet arthropathy. RIGHT greater than LEFT C6 foraminal narrowing primarily due to uncinate hypertrophy.  C6-7: Severe disc space narrowing. Marked anterior osteophytosis. Osseous ridging, facet arthropathy and BILATERAL uncinate spurring contribute to BILATERAL C7 foraminal narrowing. Superimposed soft disc protrusion is possible.  NCS/EMG of the left arm and leg 03/21/2016: 1. There is electrophysiologic evidence of a chronic, generalized sensorimotor polyradiculoneuropathy affecting the left side. When compared to his EMG dated 09/17/2013, there has been mild progression of disease. 2. There is also evidence of a superimposed left ulnar neuropathy across the elbow, demyelinating with secondary axon loss, and left median neuropathy at wrist (carpal tunnel syndrome). 3. Specifically, there is no evidence of a diffuse myopathy.  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  NCS/EMG was suggestive of axonal and demyelinating polyradiculoneuropathy. CSF testing did not show albuminocytologic dissociation, however there was increased intrathecal protein synthesis based on the presence of oligoclonal bands, therefore an inflammatory-mediated process was suspected and a trial of methylprednisolone was given (June 2015 - April 2016).  He was doing well until May 2017 when he relapsed and developed worsening weakness and therefore switched to IVIG, but self-discontinued this after one dose.    Imaging of the cervical spine shows multilevel degenerative changes and bone spurring causing RIGHT  C5 foraminal narrowing, R >L C6 foraminal narrowing, and bilateral C7  foraminal narrowing.  There is no impingement at the C8-T1 levels.  Lumbar spine imaging shows mild degenerative changes, no significant spinal or foraminal stenosis which would cause leg weakness.    Clinically, he is stable as compared to his exam in July 2017 and better than in October 2017 where he presented in a wheelchair unable to stand.  The goal of treatment is to stabilize his neuropathy and prevent progressive neurological decline.  I set realistic expectations with IVIG and informed him that benefit will takes months to appreciate.  He is agreeable to restarting IVIG 58m/kg over 5 days, then continue 198mkg every 3 weeks.  Side effects discussed.   I also stressed the importance of alcohol cessation in neural recovery and he will try harder at quitting.  Return to clinic in 4-21-month The duration of this appointment visit was 45 minutes of face-to-face time with the patient.  Greater than 50% of this time was spent in counseling, explanation of diagnosis, planning of further management, and coordination of care.   Thank you for allowing me to participate in patient's care.  If I can answer any additional questions, I would be pleased to do so.    Sincerely,    Sueanne Maniaci K. PatPosey ProntoO

## 2016-12-10 ENCOUNTER — Other Ambulatory Visit: Payer: Self-pay | Admitting: *Deleted

## 2016-12-10 DIAGNOSIS — G6181 Chronic inflammatory demyelinating polyneuritis: Secondary | ICD-10-CM

## 2016-12-10 NOTE — Progress Notes (Signed)
Order placed.  I will call patient to let him know we will start the infusions on August 6 at 9:00 am.

## 2016-12-11 ENCOUNTER — Encounter (HOSPITAL_COMMUNITY): Payer: Medicare Other

## 2016-12-11 ENCOUNTER — Telehealth: Payer: Self-pay | Admitting: Neurology

## 2016-12-11 NOTE — Telephone Encounter (Signed)
Patient called to see if his appointment on 12/16/16 for his Infusion can be rescheduled? He needs a time around 3 or 3:30PM. Please call. Thanks

## 2016-12-12 ENCOUNTER — Encounter (HOSPITAL_COMMUNITY): Payer: Medicare Other

## 2016-12-12 NOTE — Telephone Encounter (Signed)
I called patient's daughter (Cybil) and left message informing her that the infusions are not done after 3:00 per [place] at Short Stay.  She called me back and left message that the 9:00 appointment will be ok.  Called for PA.  No PA needed.  Call reference # [id number].

## 2016-12-13 ENCOUNTER — Encounter (HOSPITAL_COMMUNITY): Payer: Medicare Other

## 2016-12-13 ENCOUNTER — Encounter: Payer: Self-pay | Admitting: Cardiology

## 2016-12-16 ENCOUNTER — Encounter (HOSPITAL_COMMUNITY)
Admission: RE | Admit: 2016-12-16 | Discharge: 2016-12-16 | Disposition: A | Discharge status: Home / Self Care | Payer: Medicare Other | Source: Ambulatory Visit | Attending: Neurology | Admitting: Neurology

## 2016-12-16 DIAGNOSIS — G6181 Chronic inflammatory demyelinating polyneuritis: Secondary | ICD-10-CM

## 2016-12-16 MED ORDER — IMMUNE GLOBULIN (HUMAN) 20 GM/200ML IV SOLN
2.0000 g/kg | INTRAVENOUS | Status: DC
Start: 2016-12-16 — End: 2016-12-16
  Filled 2016-12-16: qty 1600

## 2016-12-16 MED ORDER — IMMUNE GLOBULIN (HUMAN) 10 GM/100ML IV SOLN: 400.0000 mg/kg | INTRAVENOUS | Status: DC

## 2016-12-16 MED ORDER — IMMUNE GLOBULIN (HUMAN) 20 GM/200ML IV SOLN
1.0000 g/kg | Freq: Once | INTRAVENOUS | Status: AC
  Administered 2016-12-16: 11:00:00 80 g via INTRAVENOUS

## 2016-12-17 ENCOUNTER — Telehealth: Payer: Self-pay | Admitting: Neurology

## 2016-12-17 ENCOUNTER — Encounter (HOSPITAL_COMMUNITY)
Admission: RE | Admit: 2016-12-17 | Discharge: 2016-12-17 | Disposition: A | Discharge status: Home / Self Care | Payer: Medicare Other | Source: Ambulatory Visit | Attending: Neurology | Admitting: Neurology

## 2016-12-17 DIAGNOSIS — G6181 Chronic inflammatory demyelinating polyneuritis: Secondary | ICD-10-CM | POA: Diagnosis not present

## 2016-12-17 MED ORDER — IMMUNE GLOBULIN (HUMAN) 20 GM/200ML IV SOLN
1.0000 g/kg | Freq: Once | INTRAVENOUS | Status: AC
  Administered 2016-12-17: 10:00:00 80 g via INTRAVENOUS
  Filled 2016-12-17: qty 800

## 2016-12-17 NOTE — Progress Notes (Signed)
Called Dr Posey Pronto because pt's BP was elevated at time of DC and pt stated he took his BP meds for the day already this morning.  Pt denies SOB and chest pain.  Dr Posey Pronto stated okay to let the patient go and have him check his BP today at home and call the office with the reading.  Pt verbalized understanding.

## 2016-12-17 NOTE — Telephone Encounter (Signed)
Called and spoke to [place].  Recheck BP is 172/101, HR 66 patient is asymptomatic.  I recommend that he following BP at home and call the office with an update.    Kempton Milne K. Posey Pronto, DO

## 2016-12-17 NOTE — Telephone Encounter (Signed)
Dr. Posey Pronto will call [place].

## 2016-12-17 NOTE — Telephone Encounter (Signed)
Received call from [place] at [facility]. Patient just finished IVIG and is there now. Before treatment BP - 140/70 and is now reading - 184/97. Wants to know if safe to send him home or if they should give lasix? Please advise.  Erasmo Downer can be reached at [phone].

## 2016-12-18 ENCOUNTER — Encounter (HOSPITAL_COMMUNITY): Payer: Medicare Other

## 2016-12-19 ENCOUNTER — Telehealth: Payer: Self-pay | Admitting: Neurology

## 2016-12-19 ENCOUNTER — Encounter (HOSPITAL_COMMUNITY): Payer: Medicare Other

## 2016-12-19 NOTE — Telephone Encounter (Signed)
Patient's daughter called Cybil. She was calling to give Dr. Posey Pronto her father's Blood Pressure reading from yesterday 12/18/16 at 11:30 AM 151/93 And Pulse 73. She said he is doing well. Thanks

## 2016-12-19 NOTE — Telephone Encounter (Signed)
Noted  

## 2016-12-20 ENCOUNTER — Encounter (HOSPITAL_COMMUNITY): Payer: Medicare Other

## 2016-12-24 ENCOUNTER — Telehealth: Payer: Self-pay | Admitting: Internal Medicine

## 2016-12-24 ENCOUNTER — Encounter (HOSPITAL_COMMUNITY): Payer: Medicare Other

## 2016-12-24 NOTE — Telephone Encounter (Signed)
Patient instructed on how to send manual transmission. Patient plans to call back at ~4pm to see if it was successful.

## 2016-12-24 NOTE — Telephone Encounter (Signed)
New message       Pt is calling to have someone walk him through sending a remote pacer check.  Please call after 2

## 2016-12-25 ENCOUNTER — Encounter: Payer: Self-pay | Admitting: Internal Medicine

## 2016-12-25 ENCOUNTER — Encounter (HOSPITAL_COMMUNITY): Payer: Medicare Other

## 2016-12-25 NOTE — Telephone Encounter (Signed)
Spoke with pt, pt was attempting to use TTM monitor to send transmission, pt is going to get double A batteries for the 2490H monitor and try to send a transmission pt will call back to see if transmission has been received.

## 2016-12-25 NOTE — Telephone Encounter (Signed)
LVM for pt to call back.

## 2016-12-26 NOTE — Telephone Encounter (Signed)
Follow up    Pt is calling to ask if he can bring his home monitor in for it to be looked at. He said he tried to send transmission and it would not go through. Please call.

## 2016-12-26 NOTE — Telephone Encounter (Signed)
Patient calling, states that he is trying to send a transmission but is having difficulty.

## 2016-12-26 NOTE — Telephone Encounter (Signed)
Spoke with patient.  Advised that transmission still not received.  Verified that equipment set up correctly.  Unsure why monitor not transmitting.  Discussed with patient, he is agreeable to receiving new [id number] [employer] monitor.  Ordered to home address, advised should arrive in 7-10 business days.  Printed instructions mailed from our office.  Patient is appreciative.  Advised patient that when he receives monitor, he can call for assistance setting it up, or he can bring with him to appointment with Dr. Lovena Le on 01/17/17.  Patient is agreeable to plan.

## 2016-12-28 ENCOUNTER — Encounter (HOSPITAL_COMMUNITY): Payer: Self-pay | Admitting: *Deleted

## 2016-12-28 ENCOUNTER — Emergency Department (HOSPITAL_COMMUNITY): Payer: Medicare Other

## 2016-12-28 ENCOUNTER — Inpatient Hospital Stay (HOSPITAL_COMMUNITY)
Admission: EM | Admit: 2016-12-28 | Discharge: 2017-01-02 | DRG: 071 | Disposition: A | Discharge status: Home Health | Payer: Medicare Other | Attending: Internal Medicine | Admitting: Internal Medicine

## 2016-12-28 DIAGNOSIS — D509 Iron deficiency anemia, unspecified: Secondary | ICD-10-CM | POA: Diagnosis present

## 2016-12-28 DIAGNOSIS — R402421 Glasgow coma scale score 9-12, in the field [EMT or ambulance]: Secondary | ICD-10-CM | POA: Diagnosis not present

## 2016-12-28 DIAGNOSIS — R55 Syncope and collapse: Secondary | ICD-10-CM | POA: Diagnosis not present

## 2016-12-28 DIAGNOSIS — T796XXA Traumatic ischemia of muscle, initial encounter: Secondary | ICD-10-CM | POA: Diagnosis not present

## 2016-12-28 DIAGNOSIS — G9340 Encephalopathy, unspecified: Secondary | ICD-10-CM | POA: Diagnosis not present

## 2016-12-28 DIAGNOSIS — R748 Abnormal levels of other serum enzymes: Secondary | ICD-10-CM | POA: Diagnosis present

## 2016-12-28 DIAGNOSIS — E876 Hypokalemia: Secondary | ICD-10-CM | POA: Diagnosis not present

## 2016-12-28 DIAGNOSIS — E872 Acidosis, unspecified: Secondary | ICD-10-CM | POA: Diagnosis present

## 2016-12-28 DIAGNOSIS — G6181 Chronic inflammatory demyelinating polyneuritis: Secondary | ICD-10-CM | POA: Diagnosis not present

## 2016-12-28 DIAGNOSIS — I110 Hypertensive heart disease with heart failure: Secondary | ICD-10-CM | POA: Diagnosis present

## 2016-12-28 DIAGNOSIS — S199XXA Unspecified injury of neck, initial encounter: Secondary | ICD-10-CM | POA: Diagnosis not present

## 2016-12-28 DIAGNOSIS — G9341 Metabolic encephalopathy: Principal | ICD-10-CM | POA: Diagnosis present

## 2016-12-28 DIAGNOSIS — N400 Enlarged prostate without lower urinary tract symptoms: Secondary | ICD-10-CM | POA: Diagnosis present

## 2016-12-28 DIAGNOSIS — Z79899 Other long term (current) drug therapy: Secondary | ICD-10-CM

## 2016-12-28 DIAGNOSIS — Z87891 Personal history of nicotine dependence: Secondary | ICD-10-CM

## 2016-12-28 DIAGNOSIS — I5032 Chronic diastolic (congestive) heart failure: Secondary | ICD-10-CM | POA: Diagnosis present

## 2016-12-28 DIAGNOSIS — E860 Dehydration: Secondary | ICD-10-CM | POA: Diagnosis not present

## 2016-12-28 DIAGNOSIS — W19XXXA Unspecified fall, initial encounter: Secondary | ICD-10-CM | POA: Diagnosis present

## 2016-12-28 DIAGNOSIS — Z950 Presence of cardiac pacemaker: Secondary | ICD-10-CM | POA: Diagnosis present

## 2016-12-28 DIAGNOSIS — S0990XA Unspecified injury of head, initial encounter: Secondary | ICD-10-CM | POA: Diagnosis not present

## 2016-12-28 DIAGNOSIS — R4182 Altered mental status, unspecified: Secondary | ICD-10-CM

## 2016-12-28 DIAGNOSIS — G610 Guillain-Barre syndrome: Secondary | ICD-10-CM

## 2016-12-28 LAB — COMPREHENSIVE METABOLIC PANEL
ALBUMIN: 3.2 g/dL — AB (ref 3.5–5.0)
ALT: 48 U/L (ref 17–63)
ANION GAP: 11 (ref 5–15)
AST: 97 U/L — AB (ref 15–41)
Alkaline Phosphatase: 72 U/L (ref 38–126)
BUN: 11 mg/dL (ref 6–20)
CHLORIDE: 104 mmol/L (ref 101–111)
CO2: 23 mmol/L (ref 22–32)
Calcium: 9.1 mg/dL (ref 8.9–10.3)
Creatinine, Ser: 1.22 mg/dL (ref 0.61–1.24)
GFR calc Af Amer: >60 mL/min (ref >60)
GFR calc non Af Amer: 55 mL/min — ABNORMAL LOW (ref >60)
GLUCOSE: 89 mg/dL (ref 65–99)
POTASSIUM: 3.7 mmol/L (ref 3.5–5.1)
SODIUM: 138 mmol/L (ref 135–145)
Total Bilirubin: 1.1 mg/dL (ref 0.3–1.2)
Total Protein: 8.5 g/dL — ABNORMAL HIGH (ref 6.5–8.1)

## 2016-12-28 LAB — CBC WITH DIFFERENTIAL/PLATELET
BASOS ABS: 0 10*3/uL (ref 0.0–0.1)
BASOS PCT: 0 %
EOS ABS: 0 10*3/uL (ref 0.0–0.7)
EOS PCT: 0 %
HCT: 36.4 % — ABNORMAL LOW (ref 39.0–52.0)
Hemoglobin: 12.1 g/dL — ABNORMAL LOW (ref 13.0–17.0)
LYMPHS PCT: 11 %
Lymphs Abs: 1 10*3/uL (ref 0.7–4.0)
MCH: 28.9 pg (ref 26.0–34.0)
MCHC: 33.2 g/dL (ref 30.0–36.0)
MCV: 86.9 fL (ref 78.0–100.0)
Monocytes Absolute: 0.6 10*3/uL (ref 0.1–1.0)
Monocytes Relative: 7 %
Neutro Abs: 7.3 10*3/uL (ref 1.7–7.7)
Neutrophils Relative %: 82 %
PLATELETS: 172 10*3/uL (ref 150–400)
RBC: 4.19 MIL/uL — AB (ref 4.22–5.81)
RDW: 15.5 % (ref 11.5–15.5)
WBC: 8.9 10*3/uL (ref 4.0–10.5)

## 2016-12-28 LAB — I-STAT TROPONIN, ED: Troponin i, poc: 0 ng/mL (ref 0.00–0.08)

## 2016-12-28 LAB — URINALYSIS, ROUTINE W REFLEX MICROSCOPIC
BACTERIA UA: NONE SEEN
BILIRUBIN URINE: NEGATIVE
Glucose, UA: NEGATIVE mg/dL
KETONES UR: NEGATIVE mg/dL
Leukocytes, UA: NEGATIVE
NITRITE: NEGATIVE
PROTEIN: 30 mg/dL — AB
Specific Gravity, Urine: 1.013 (ref 1.005–1.030)
Squamous Epithelial / LPF: NONE SEEN
pH: 5 (ref 5.0–8.0)

## 2016-12-28 LAB — AMMONIA: Ammonia: 36 umol/L — ABNORMAL HIGH (ref 9–35)

## 2016-12-28 LAB — CBC
HCT: 34.5 % — ABNORMAL LOW (ref 39.0–52.0)
HEMOGLOBIN: 11.3 g/dL — AB (ref 13.0–17.0)
MCH: 28.7 pg (ref 26.0–34.0)
MCHC: 32.8 g/dL (ref 30.0–36.0)
MCV: 87.6 fL (ref 78.0–100.0)
PLATELETS: 168 10*3/uL (ref 150–400)
RBC: 3.94 MIL/uL — ABNORMAL LOW (ref 4.22–5.81)
RDW: 15.5 % (ref 11.5–15.5)
WBC: 7.9 10*3/uL (ref 4.0–10.5)

## 2016-12-28 LAB — PROCALCITONIN: PROCALCITONIN: 0.1 ng/mL

## 2016-12-28 LAB — TROPONIN I: Troponin I: 0.03 ng/mL (ref <0.03)

## 2016-12-28 LAB — I-STAT CG4 LACTIC ACID, ED: Lactic Acid, Venous: 6.45 mmol/L (ref 0.5–1.9)

## 2016-12-28 LAB — CREATININE, SERUM
Creatinine, Ser: 1.05 mg/dL (ref 0.61–1.24)
GFR calc Af Amer: >60 mL/min (ref >60)
GFR calc non Af Amer: >60 mL/min (ref >60)

## 2016-12-28 LAB — LACTIC ACID, PLASMA: LACTIC ACID, VENOUS: 3.2 mmol/L — AB (ref 0.5–1.9)

## 2016-12-28 LAB — CK: CK TOTAL: 720 U/L — AB (ref 49–397)

## 2016-12-28 MED ORDER — ONDANSETRON HCL 4 MG/2ML IJ SOLN: 4.0000 mg | Freq: Four times a day (QID) | INTRAMUSCULAR | Status: DC | PRN

## 2016-12-28 MED ORDER — SODIUM CHLORIDE 0.9 % IV BOLUS (SEPSIS)
500.0000 mL | Freq: Once | INTRAVENOUS | Status: AC
  Administered 2016-12-28: 500 mL via INTRAVENOUS

## 2016-12-28 MED ORDER — ENSURE ENLIVE PO LIQD
237.0000 mL | Freq: Two times a day (BID) | ORAL | Status: DC
  Administered 2016-12-29 – 2017-01-01 (×3): 237 mL via ORAL

## 2016-12-28 MED ORDER — METOPROLOL TARTRATE 5 MG/5ML IV SOLN
5.0000 mg | Freq: Once | INTRAVENOUS | Status: AC
  Administered 2016-12-28: 5 mg via INTRAVENOUS
  Filled 2016-12-28: qty 5

## 2016-12-28 MED ORDER — VITAMIN D (ERGOCALCIFEROL) 1.25 MG (50000 UNIT) PO CAPS
50000.0000 [IU] | ORAL_CAPSULE | ORAL | Status: DC
  Administered 2017-01-02: 50000 [IU] via ORAL
  Filled 2016-12-28: qty 1

## 2016-12-28 MED ORDER — SODIUM CHLORIDE 0.9 % IV SOLN
INTRAVENOUS | Status: DC
  Administered 2016-12-28 – 2016-12-31 (×4): via INTRAVENOUS

## 2016-12-28 MED ORDER — VANCOMYCIN HCL IN DEXTROSE 750-5 MG/150ML-% IV SOLN
750.0000 mg | Freq: Two times a day (BID) | INTRAVENOUS | Status: DC
Start: 2016-12-29 — End: 2016-12-29
  Administered 2016-12-29: 750 mg via INTRAVENOUS
  Filled 2016-12-28 (×2): qty 150

## 2016-12-28 MED ORDER — ACETAMINOPHEN 650 MG RE SUPP: 650.0000 mg | Freq: Four times a day (QID) | RECTAL | Status: DC | PRN

## 2016-12-28 MED ORDER — TRAZODONE HCL 50 MG PO TABS
50.0000 mg | ORAL_TABLET | Freq: Every day | ORAL | Status: DC
  Administered 2016-12-28: 50 mg via ORAL
  Filled 2016-12-28: qty 1

## 2016-12-28 MED ORDER — VANCOMYCIN HCL IN DEXTROSE 1-5 GM/200ML-% IV SOLN: 1000.0000 mg | Freq: Once | INTRAVENOUS | Status: DC

## 2016-12-28 MED ORDER — METOPROLOL SUCCINATE ER 25 MG PO TB24
25.0000 mg | ORAL_TABLET | Freq: Every day | ORAL | Status: DC
  Administered 2016-12-28 – 2017-01-01 (×5): 25 mg via ORAL
  Filled 2016-12-28 (×5): qty 1

## 2016-12-28 MED ORDER — ONDANSETRON HCL 4 MG PO TABS: 4.0000 mg | ORAL_TABLET | Freq: Four times a day (QID) | ORAL | Status: DC | PRN

## 2016-12-28 MED ORDER — TAMSULOSIN HCL 0.4 MG PO CAPS
0.4000 mg | ORAL_CAPSULE | Freq: Every day | ORAL | Status: DC
  Administered 2016-12-28 – 2017-01-01 (×5): 0.4 mg via ORAL
  Filled 2016-12-28 (×5): qty 1

## 2016-12-28 MED ORDER — SODIUM CHLORIDE 0.9 % IV BOLUS (SEPSIS)
1000.0000 mL | Freq: Once | INTRAVENOUS | Status: AC
  Administered 2016-12-28: 1000 mL via INTRAVENOUS

## 2016-12-28 MED ORDER — ASPIRIN 300 MG RE SUPP: 300.0000 mg | Freq: Once | RECTAL | Status: DC

## 2016-12-28 MED ORDER — PIPERACILLIN-TAZOBACTAM 3.375 G IVPB
3.3750 g | Freq: Three times a day (TID) | INTRAVENOUS | Status: DC
  Administered 2016-12-28 – 2016-12-29 (×2): 3.375 g via INTRAVENOUS
  Filled 2016-12-28 (×3): qty 50

## 2016-12-28 MED ORDER — ENOXAPARIN SODIUM 40 MG/0.4ML ~~LOC~~ SOLN
40.0000 mg | SUBCUTANEOUS | Status: DC
  Administered 2016-12-29 – 2017-01-02 (×5): 40 mg via SUBCUTANEOUS
  Filled 2016-12-28 (×5): qty 0.4

## 2016-12-28 MED ORDER — FLECAINIDE ACETATE 50 MG PO TABS
75.0000 mg | ORAL_TABLET | Freq: Two times a day (BID) | ORAL | Status: DC
  Administered 2016-12-29 – 2017-01-02 (×9): 75 mg via ORAL
  Filled 2016-12-28 (×12): qty 2

## 2016-12-28 MED ORDER — ASPIRIN 325 MG PO TABS
325.0000 mg | ORAL_TABLET | Freq: Once | ORAL | Status: DC
  Filled 2016-12-28: qty 1

## 2016-12-28 MED ORDER — PIPERACILLIN-TAZOBACTAM 3.375 G IVPB 30 MIN
3.3750 g | Freq: Once | INTRAVENOUS | Status: AC
  Administered 2016-12-28: 3.375 g via INTRAVENOUS
  Filled 2016-12-28: qty 50

## 2016-12-28 MED ORDER — ACETAMINOPHEN 325 MG PO TABS: 650.0000 mg | ORAL_TABLET | Freq: Four times a day (QID) | ORAL | Status: DC | PRN

## 2016-12-28 MED ORDER — VANCOMYCIN HCL 10 G IV SOLR
1500.0000 mg | Freq: Once | INTRAVENOUS | Status: AC
  Administered 2016-12-28: 1500 mg via INTRAVENOUS
  Filled 2016-12-28: qty 1500

## 2016-12-28 NOTE — Progress Notes (Signed)
Pt. Will not keep tele monitor on keeps pulling electrodes off. Has IV fluids scheduled, but patient is agitated and pulling at things so I will hold fluids for an hour and try again.

## 2016-12-28 NOTE — ED Notes (Signed)
Patient transported to CT 

## 2016-12-28 NOTE — ED Provider Notes (Signed)
[facility] DEPT Provider Note   CSN: [id number] Arrival date & time: 12/28/16  1310  LEVEL 5 CAVEAT - ALTERED MENTAL STATUS   History   Chief Complaint Chief Complaint  Patient presents with  . Altered Mental Status    HPI Jonathon [name] is a 81 y.o. male.  HPI  81 year old male with a history of CIDP, pacemaker, diastolic CHF presents with altered mental status. History is unobtainable from the patient given how altered he is. History is obtained from EMS and the daughter when she arrived. Last seen normal around 5:30 PM when he was talked to over the phone. He lives by himself. This morning family came to pick him up and he was found lying on the kitchen floor. Unclear how long. He was acting confused and was brought in by EMS. Has had somewhat similar episodes before. The niece is at the bedside when I'm currently examining the patient and he does not recognize who she is.  Past Medical History:  Diagnosis Date  . Asthma   . BENIGN PROSTATIC HYPERTROPHY, HX OF 10/25/2008  . BRADYCARDIA 2005  . Chronic diastolic congestive heart failure ([place]) 06/05/2016  . CIDP (chronic inflammatory demyelinating polyneuropathy) ([place])   . HYPERTENSION 11/21/2006  . Iron deficiency anemia, unspecified 04/19/2013  . 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 PACEMAKER, PERMANENT 2005   Gen change 2014 Medtronic Adaptic L dual-chamber pacemaker, serial [id number]   . SVT (supraventricular tachycardia) ([place])   . TOBACCO ABUSE 10/25/2008   Quit 2012    Patient Active Problem List   Diagnosis Date Noted  . Acute encephalopathy 12/28/2016  . Lactic acidosis 12/28/2016  . FUO (fever of unknown origin) 06/05/2016  . Hepatic steatosis 06/05/2016  . Physical deconditioning 06/05/2016  . Chronic diastolic congestive heart failure ([place]) 06/05/2016  . Fall   . Orthostatic syncope   . Elevated liver function tests   . LOC (loss of consciousness) ([place]) 08/10/2014  . Syncope  08/10/2014  . CIDP (chronic inflammatory demyelinating polyneuropathy) ([place]) 08/08/2014  . Sustained SVT ([place]) 07/27/2014  . SOB (shortness of breath) 09/22/2013  . 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 Abnormal LFTs 08/06/2013  . Iron deficiency anemia 04/19/2013  . Muscle weakness 01/13/2013  . External hemorrhoids 03/23/2011  . Symptomatic bradycardia 10/25/2008  . 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 BENIGN PROSTATIC HYPERTROPHY, HX OF 10/25/2008  . PACEMAKER, PERMANENT 10/25/2008  . Essential hypertension 11/21/2006  . BENIGN PROSTATIC HYPERTROPHY 11/21/2006  . History of colonic polyps 11/21/2006  . NEPHROLITHIASIS, HX OF 11/21/2006    Past Surgical History:  Procedure Laterality Date  . COLONOSCOPY  [id number]  . PACEMAKER INSERTION  2005  . PERMANENT PACEMAKER GENERATOR CHANGE N/A 06/19/2012   Procedure: PERMANENT PACEMAKER GENERATOR CHANGE;  Surgeon: [name], MD; Medtronic Adaptic L dual-chamber pacemaker, serial #[id number]    . SKIN GRAFT Right 1962   wrist       Home Medications    Prior to Admission medications   Medication Sig Start Date End Date Taking? Authorizing Provider  b complex vitamins tablet Take 1 tablet by mouth daily as needed (for supplement).    Yes [provider]  Cyanocobalamin (VITAMIN B 12 PO) Take 1,000 mg by mouth daily as needed (for supplement).    Yes [provider]  feeding supplement, ENSURE ENLIVE, (ENSURE ENLIVE) LIQD Take 237 mLs by mouth 2 (two) times daily between meals. 06/07/16  Yes Florencia Reasons, MD  flecainide (TAMBOCOR) 150 MG tablet TAKE 1/2 TABLET  BY MOUTH TWICE DAILY Patient taking differently: TAKE 75MG  BY MOUTH TWICE DAILY 07/15/16  Yes [name], MD  Garlic 7893 MG CAPS Take 2,000 mg by mouth daily as needed (supplement).    Yes [provider]  irbesartan (AVAPRO) 300 MG tablet Take 1 tablet (300 mg total) by mouth daily. 12/08/15  Yes [name], MD  metoprolol succinate (TOPROL XL)  25 MG 24 hr tablet Take 1 tablet (25 mg total) by mouth daily. 01/25/16  Yes Sherran Needs, NP  Omega-3 Fatty Acids (FISH OIL) 1000 MG CAPS Take 1 capsule by mouth daily as needed (for supplement).    Yes [provider]  tamsulosin (FLOMAX) 0.4 MG CAPS capsule TAKE 1 CAPSULE BY MOUTH EVERY DAY Patient taking differently: TAKE 0.4MG  BY MOUTH EVERY DAY 11/07/16  Yes Marletta Lor, MD  Vitamin D, Ergocalciferol, (DRISDOL) 50000 units CAPS capsule Take 1 capsule (50,000 Units total) by mouth every 7 (seven) days. 09/21/15  Yes Lyndal Pulley, DO    Family History Family History  Problem Relation Age of Onset  . Heart attack Father        Died, 48  . Kidney disease Father   . Parkinson's disease Mother        Died, 72  . Breast cancer Sister        Living, 70  . Diabetes type II Brother        Living, 87  . Breast cancer Sister        Living, 54  . Diabetes Daughter        Living, 36  . Diabetes Son        Living, 41  . Ovarian cancer Daughter   . Colon cancer Neg Hx   . Rectal cancer Neg Hx   . Stomach cancer Neg Hx     Social History Social History  Substance Use Topics  . Smoking status: Former Smoker    Packs/day: 0.25    Years: 46.00    Types: Cigarettes    Start date: 03/16/1965    Quit date: 06/15/2010  . Smokeless tobacco: Never Used     Comment: quit 3 years ago  . Alcohol use No     Comment: 4 beers per day     Allergies   Patient has no known allergies.   Review of Systems Review of Systems  Unable to perform ROS: Mental status change     Physical Exam Updated Vital Signs BP (!) 190/100   Pulse 98   Temp 98.4 F (36.9 C) (Rectal)   Resp 18   SpO2 100%   Physical Exam  Constitutional: He appears well-developed and well-nourished.  HENT:  Head: Normocephalic and atraumatic.  Right Ear: External ear normal.  Left Ear: External ear normal.  Nose: Nose normal.  Eyes: Pupils are equal, round, and reactive to light. EOM are  normal. Right eye exhibits no discharge. Left eye exhibits no discharge.  Neck: Neck supple.  Cardiovascular: Normal rate, regular rhythm and normal heart sounds.   Pulmonary/Chest: Effort normal and breath sounds normal.  Abdominal: Soft. There is no tenderness.  Musculoskeletal: He exhibits no edema.  Neurological: He is alert. He is disoriented.  Awake, alert but disoriented. Follow commands but very slowly. Often does them incorrectly. However he moves all 4 extremities equally.  Skin: Skin is warm and dry.  Nursing note and vitals reviewed.    ED Treatments / Results  Labs (all labs ordered are listed,  but only abnormal results are displayed) Labs Reviewed  URINALYSIS, ROUTINE W REFLEX MICROSCOPIC - Abnormal; Notable for the following:       Result Value   Hgb urine dipstick MODERATE (*)    Protein, ur 30 (*)    All other components within normal limits  CBC WITH DIFFERENTIAL/PLATELET - Abnormal; Notable for the following:    RBC 4.19 (*)    Hemoglobin 12.1 (*)    HCT 36.4 (*)    All other components within normal limits  CK - Abnormal; Notable for the following:    Total CK 720 (*)    All other components within normal limits  COMPREHENSIVE METABOLIC PANEL - Abnormal; Notable for the following:    Total Protein 8.5 (*)    Albumin 3.2 (*)    AST 97 (*)    GFR calc non Af Amer 55 (*)    All other components within normal limits  LACTIC ACID, PLASMA - Abnormal; Notable for the following:    Lactic Acid, Venous 3.2 (*)    All other components within normal limits  AMMONIA - Abnormal; Notable for the following:    Ammonia 36 (*)    All other components within normal limits  I-STAT CG4 LACTIC ACID, ED - Abnormal; Notable for the following:    Lactic Acid, Venous 6.45 (*)    All other components within normal limits  CULTURE, BLOOD (ROUTINE X 2)  CULTURE, BLOOD (ROUTINE X 2)  URINE CULTURE  PROCALCITONIN  URINALYSIS, ROUTINE W REFLEX MICROSCOPIC  LACTIC ACID, PLASMA    BASIC METABOLIC PANEL  CBC  CBC  CREATININE, SERUM  TROPONIN I  TROPONIN I  TROPONIN I  CBG MONITORING, ED  I-STAT TROPONIN, ED    EKG  EKG Interpretation None       Radiology Ct Head Wo Contrast  Result Date: 12/28/2016 CLINICAL DATA:  Found on the floor. Not following commands. Confusion. EXAM: CT HEAD WITHOUT CONTRAST CT CERVICAL SPINE WITHOUT CONTRAST TECHNIQUE: Multidetector CT imaging of the head and cervical spine was performed following the standard protocol without intravenous contrast. Multiplanar CT image reconstructions of the cervical spine were also generated. COMPARISON:  06/05/2016.  03/27/2016. FINDINGS: CT HEAD FINDINGS Brain: Mild age related atrophy. No evidence of old or acute focal infarction, mass lesion, hemorrhage, hydrocephalus or extra-axial collection. Vascular: There is atherosclerotic calcification of the major vessels at the base of the brain. Skull: Normal Sinuses/Orbits: Clear. Orbits normal except for old lamina papyracea fracture on the right. Other: None CT CERVICAL SPINE FINDINGS Alignment: Straightening of the normal cervical lordosis. 3 mm anterolisthesis C7-T1 which is chronic. Skull base and vertebrae: Chronic posterior element fusion at C2-3. Prominent anterior osteophytes which could be associated with dysphagia. Soft tissues and spinal canal: Otherwise negative Disc levels: Degenerative spondylosis C3-4 through C7-T1. Facet arthropathy bilaterally at C7-T1 and on the right at see C3-4 and C4-5. Mild bony foraminal narrowing because of these processes. Upper chest: Negative Other: None IMPRESSION: Head CT:  No acute or traumatic finding.  Normal study for age. Cervical spine CT: No acute or traumatic finding. Extensive chronic degenerative changes as outlined above. Electronically Signed   By: Nelson Chimes M.D.   On: 12/28/2016 15:31   Ct Cervical Spine Wo Contrast  Result Date: 12/28/2016 CLINICAL DATA:  Found on the floor. Not following  commands. Confusion. EXAM: CT HEAD WITHOUT CONTRAST CT CERVICAL SPINE WITHOUT CONTRAST TECHNIQUE: Multidetector CT imaging of the head and cervical spine was performed following the standard  protocol without intravenous contrast. Multiplanar CT image reconstructions of the cervical spine were also generated. COMPARISON:  06/05/2016.  03/27/2016. FINDINGS: CT HEAD FINDINGS Brain: Mild age related atrophy. No evidence of old or acute focal infarction, mass lesion, hemorrhage, hydrocephalus or extra-axial collection. Vascular: There is atherosclerotic calcification of the major vessels at the base of the brain. Skull: Normal Sinuses/Orbits: Clear. Orbits normal except for old lamina papyracea fracture on the right. Other: None CT CERVICAL SPINE FINDINGS Alignment: Straightening of the normal cervical lordosis. 3 mm anterolisthesis C7-T1 which is chronic. Skull base and vertebrae: Chronic posterior element fusion at C2-3. Prominent anterior osteophytes which could be associated with dysphagia. Soft tissues and spinal canal: Otherwise negative Disc levels: Degenerative spondylosis C3-4 through C7-T1. Facet arthropathy bilaterally at C7-T1 and on the right at see C3-4 and C4-5. Mild bony foraminal narrowing because of these processes. Upper chest: Negative Other: None IMPRESSION: Head CT:  No acute or traumatic finding.  Normal study for age. Cervical spine CT: No acute or traumatic finding. Extensive chronic degenerative changes as outlined above. Electronically Signed   By: Nelson Chimes M.D.   On: 12/28/2016 15:31   Dg Chest Portable 1 View  Result Date: 12/28/2016 CLINICAL DATA:  Altered mental status. EXAM: PORTABLE CHEST 1 VIEW COMPARISON:  06/05/2016 FINDINGS: Cardiac silhouette is normal size. No mediastinal or hilar masses. No convincing adenopathy. Left anterior chest wall sequential pacemaker is stable. Clear lungs.  No pleural effusion or pneumothorax. Skeletal structures are grossly intact. IMPRESSION: No  active disease. Electronically Signed   By: Lajean Manes M.D.   On: 12/28/2016 13:58    Procedures Procedures (including critical care time)  Medications Ordered in ED Medications  piperacillin-tazobactam (ZOSYN) IVPB 3.375 g (not administered)  vancomycin (VANCOCIN) IVPB 750 mg/150 ml premix (not administered)  0.9 %  sodium chloride infusion (not administered)  acetaminophen (TYLENOL) tablet 650 mg (not administered)    Or  acetaminophen (TYLENOL) suppository 650 mg (not administered)  ondansetron (ZOFRAN) tablet 4 mg (not administered)    Or  ondansetron (ZOFRAN) injection 4 mg (not administered)  enoxaparin (LOVENOX) injection 40 mg (not administered)  tamsulosin (FLOMAX) capsule 0.4 mg (not administered)  flecainide (TAMBOCOR) tablet 75 mg (not administered)  feeding supplement (ENSURE ENLIVE) (ENSURE ENLIVE) liquid 237 mL (not administered)  metoprolol succinate (TOPROL-XL) 24 hr tablet 25 mg (not administered)  Vitamin D (Ergocalciferol) (DRISDOL) capsule 50,000 Units (not administered)  aspirin tablet 325 mg (not administered)    Or  aspirin suppository 300 mg (not administered)  sodium chloride 0.9 % bolus 1,000 mL (0 mLs Intravenous Stopped 12/28/16 1553)    And  sodium chloride 0.9 % bolus 1,000 mL (0 mLs Intravenous Stopped 12/28/16 1701)    And  sodium chloride 0.9 % bolus 500 mL (0 mLs Intravenous Stopped 12/28/16 1636)  piperacillin-tazobactam (ZOSYN) IVPB 3.375 g (0 g Intravenous Stopped 12/28/16 1526)  vancomycin (VANCOCIN) 1,500 mg in sodium chloride 0.9 % 500 mL IVPB (0 mg Intravenous Stopped 12/28/16 1636)  metoprolol tartrate (LOPRESSOR) injection 5 mg (5 mg Intravenous Given 12/28/16 1847)     Initial Impression / Assessment and Plan / ED Course  I have reviewed the triage vital signs and the nursing notes.  Pertinent labs & imaging results that were available during my care of the patient were reviewed by me and considered in my medical decision making (see  chart for details).     No clear source of AMS. Has mildly improved mental status but not back  to baseline per family. Possibly a seizure that could account for large lactate without other cause (no fever, normal WBC, no clear infection). Doubt meningitis, has had episodes like this before. Normal neck ROM. Unable to get MRI due to pacemaker. neuro consulted, hospitalist to admit for obs.   Final Clinical Impressions(s) / ED Diagnoses   Final diagnoses:  Altered mental status, unspecified altered mental status type    New Prescriptions Current Discharge Medication List       Sherwood Gambler, MD 12/28/16 [phone]

## 2016-12-28 NOTE — Progress Notes (Signed)
Pharmacy Antibiotic Note  Jonathon [name] is a 80 y.o. male admitted on 12/28/2016 with sepsis.  Pharmacy has been consulted for Zosyn and vancomycin dosing.  Afebrile, WBC wnl. SCr 1.22, CrCl ~73ml/min  Plan: Continue Zosyn 3.375 gm IV q8h (4 hour infusion) Continue vancomycin 1.5g IV x 1, then start vancomycin 750mg  IV Q12h Monitor clinical picture, renal function, VT prn F/U C&S, abx deescalation / LOT   Temp (24hrs), Avg:98.4 F (36.9 C), Min:98.4 F (36.9 C), Max:98.4 F (36.9 C)   Recent Labs Lab 12/28/16 1335 12/28/16 1341  WBC 8.9  --   CREATININE 1.22  --   LATICACIDVEN  --  6.45*    Estimated Creatinine Clearance: 51.5 mL/min (by C-G formula based on SCr of 1.22 mg/dL).    No Known Allergies  Antimicrobials this admission: Zosyn 8/18 >>  Vancomycin 8/18 >>   Dose adjustments this admission: n/a  Microbiology results: 8/18 BCx: sent  Thank you for allowing pharmacy to be a part of this patient's care.  Elenor Quinones, PharmD, BCPS Clinical Pharmacist Pager [phone] 12/28/2016 2:10 PM

## 2016-12-28 NOTE — Progress Notes (Signed)
CRITICAL VALUE ALERT  Critical Value:  Troponin 0.03  Date & Time Notified:  0900 12/28/16  Provider Notified: X. Blount  Orders Received/Actions taken:

## 2016-12-28 NOTE — Progress Notes (Signed)
Pt. BP  On arrival is 190/100, temp. 118. MD notified. 5mg  IV lopressor ordered.

## 2016-12-28 NOTE — Progress Notes (Signed)
Rechecked BP 180/110 manual BP, pulse 94, will notify MD, Pt is very anxious and restless and try and get something for anxiety also. IV fluids running now.

## 2016-12-28 NOTE — ED Notes (Signed)
RN interrogated Pt Medtronic Pacemaker.

## 2016-12-28 NOTE — H&P (Signed)
History and Physical        Hospital Admission Note Date: 12/28/2016  Patient name: Jonathon [name] Medical record number: [id number] Date of birth: [birth year] Age: 81 y.o. Gender: male  PCP: Marletta Lor, MD    Patient coming from: home   I have reviewed all records in the [facility].    Chief Complaint:  Confused  HPI: Patient is a 81 year old male with history of BPH, chronic diastolic CHF, CIDP, hypertension, iron deficiency anemia, SVT status post pacemaker, lives alone and was brought by EMS. He was found lying in the kitchen floor by family this morning confused. At the time of my examination, patient is confused, only oriented to himself. No family member is present at the bedside. Per EDP, Dr Regenia Skeeter, patient was seen last normal last night by his daughter. His daughter tried to contact him today and was unsuccessful. Subsequently family found patient lying on the floor, EMS was called. Unclear if patient had a syncopal episode or any seizure. He lives at home alone. Unable to obtain any other history from the patient himself.   ED work-up/course:  In ED,  temp 98.4, heart rate 107, BP 193/105 O2 sats 100% on room air CBC, BMET unremarkable CK 720 Troponin 1 negative, lactic acid 6.45 EKG: 97, NSR, RBBB   Review of Systems: Positives marked in 'bold' Unable to assess due to his mental status  Past Medical History: Past Medical History:  Diagnosis Date  . Asthma   . BENIGN PROSTATIC HYPERTROPHY, HX OF 10/25/2008  . BRADYCARDIA 2005  . Chronic diastolic congestive heart failure ([place]) 06/05/2016  . CIDP (chronic inflammatory demyelinating polyneuropathy) ([place])   . HYPERTENSION 11/21/2006  . Iron deficiency anemia, unspecified 04/19/2013  . 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 PACEMAKER, PERMANENT 2005   Gen change 2014  Medtronic Adaptic L dual-chamber pacemaker, serial [id number]   . SVT (supraventricular tachycardia) ([place])   . TOBACCO ABUSE 10/25/2008   Quit 2012    Past Surgical History:  Procedure Laterality Date  . COLONOSCOPY  [id number]  . PACEMAKER INSERTION  2005  . PERMANENT PACEMAKER GENERATOR CHANGE N/A 06/19/2012   Procedure: PERMANENT PACEMAKER GENERATOR CHANGE;  Surgeon: [name], MD; Medtronic Adaptic L dual-chamber pacemaker, serial #[id number]    . SKIN GRAFT Right 1962   wrist    Medications: Prior to Admission medications   Medication Sig Start Date End Date Taking? Authorizing Provider  b complex vitamins tablet Take 1 tablet by mouth daily as needed (for supplement).    Yes [provider]  Cyanocobalamin (VITAMIN B 12 PO) Take 1,000 mg by mouth daily as needed (for supplement).    Yes [provider]  feeding supplement, ENSURE ENLIVE, (ENSURE ENLIVE) LIQD Take 237 mLs by mouth 2 (two) times daily between meals. 06/07/16  Yes Florencia Reasons, MD  flecainide (TAMBOCOR) 150 MG tablet TAKE 1/2 TABLET BY MOUTH TWICE DAILY Patient taking differently: TAKE 75MG  BY MOUTH TWICE DAILY 07/15/16  Yes [name], MD  Garlic 2297 MG CAPS Take 2,000 mg by mouth daily as needed (supplement).    Yes [provider]  irbesartan Levy Sjogren)  300 MG tablet Take 1 tablet (300 mg total) by mouth daily. 12/08/15  Yes [name], MD  metoprolol succinate (TOPROL XL) 25 MG 24 hr tablet Take 1 tablet (25 mg total) by mouth daily. 01/25/16  Yes Sherran Needs, NP  Omega-3 Fatty Acids (FISH OIL) 1000 MG CAPS Take 1 capsule by mouth daily as needed (for supplement).    Yes [provider]  tamsulosin (FLOMAX) 0.4 MG CAPS capsule TAKE 1 CAPSULE BY MOUTH EVERY DAY Patient taking differently: TAKE 0.4MG  BY MOUTH EVERY DAY 11/07/16  Yes Marletta Lor, MD  Vitamin D, Ergocalciferol, (DRISDOL) 50000 units CAPS capsule Take 1 capsule (50,000 Units total) by mouth every 7  (seven) days. 09/21/15  Yes Lyndal Pulley, DO    Allergies:  No Known Allergies  Social History:  per chart, reports that he quit smoking about 6 years ago. His smoking use included Cigarettes. He started smoking about 51 years ago. He has a 11.50 pack-year smoking history. He has never used smokeless tobacco. He reports that he does not drink alcohol or use drugs.  Family History: Family History  Problem Relation Age of Onset  . Heart attack Father        Died, 67  . Kidney disease Father   . Parkinson's disease Mother        Died, 32  . Breast cancer Sister        Living, 78  . Diabetes type II Brother        Living, 55  . Breast cancer Sister        Living, 37  . Diabetes Daughter        Living, 68  . Diabetes Son        Living, 56  . Ovarian cancer Daughter   . Colon cancer Neg Hx   . Rectal cancer Neg Hx   . Stomach cancer Neg Hx     Physical Exam: Blood pressure (!) 193/105, pulse (!) 107, temperature 98.4 F (36.9 C), temperature source Rectal, resp. rate 18, SpO2 100 %. General: Confused, alert and oriented to himself Eyes: pink conjunctiva,anicteric sclera, pupils equal and reactive to light and accomodation, HEENT: normocephalic, atraumatic, oropharynx clear Neck: supple, no masses or lymphadenopathy, no goiter, no bruits, no JVD CVS: Regular rate and rhythm, tachycardia, without murmurs, rubs or gallops. No lower extremity edema Resp : Clear to auscultation bilaterally, no wheezing, rales or rhonchi. Pacemaker in the left chest wall GI : Soft, nontender, nondistended, positive bowel sounds, no masses. No hepatomegaly. No hernia.  Musculoskeletal: No clubbing or cyanosis, positive pedal pulses. No contracture. ROM intact  Neuro: moving all 4 extremities Psych confused, oriented  to self Skin: no rashes or lesions, warm and dry   LABS on Admission: I have personally reviewed all the labs and imagings below    Basic Metabolic Panel:  Recent Labs Lab  12/28/16 1335  NA 138  K 3.7  CL 104  CO2 23  GLUCOSE 89  BUN 11  CREATININE 1.22  CALCIUM 9.1   Liver Function Tests:  Recent Labs Lab 12/28/16 1335  AST 97*  ALT 48  ALKPHOS 72  BILITOT 1.1  PROT 8.5*  ALBUMIN 3.2*   No results for input(s): LIPASE, AMYLASE in the last 168 hours. No results for input(s): AMMONIA in the last 168 hours. CBC:  Recent Labs Lab 12/28/16 1335  WBC 8.9  NEUTROABS 7.3  HGB 12.1*  HCT 36.4*  MCV 86.9  PLT 172  Cardiac Enzymes:  Recent Labs Lab 12/28/16 1335  CKTOTAL 720*   BNP: Invalid input(s): POCBNP CBG: No results for input(s): GLUCAP in the last 168 hours.  Radiological Exams on Admission:  Ct Head Wo Contrast  Result Date: 12/28/2016 CLINICAL DATA:  Found on the floor. Not following commands. Confusion. EXAM: CT HEAD WITHOUT CONTRAST CT CERVICAL SPINE WITHOUT CONTRAST TECHNIQUE: Multidetector CT imaging of the head and cervical spine was performed following the standard protocol without intravenous contrast. Multiplanar CT image reconstructions of the cervical spine were also generated. COMPARISON:  06/05/2016.  03/27/2016. FINDINGS: CT HEAD FINDINGS Brain: Mild age related atrophy. No evidence of old or acute focal infarction, mass lesion, hemorrhage, hydrocephalus or extra-axial collection. Vascular: There is atherosclerotic calcification of the major vessels at the base of the brain. Skull: Normal Sinuses/Orbits: Clear. Orbits normal except for old lamina papyracea fracture on the right. Other: None CT CERVICAL SPINE FINDINGS Alignment: Straightening of the normal cervical lordosis. 3 mm anterolisthesis C7-T1 which is chronic. Skull base and vertebrae: Chronic posterior element fusion at C2-3. Prominent anterior osteophytes which could be associated with dysphagia. Soft tissues and spinal canal: Otherwise negative Disc levels: Degenerative spondylosis C3-4 through C7-T1. Facet arthropathy bilaterally at C7-T1 and on the right at  see C3-4 and C4-5. Mild bony foraminal narrowing because of these processes. Upper chest: Negative Other: None IMPRESSION: Head CT:  No acute or traumatic finding.  Normal study for age. Cervical spine CT: No acute or traumatic finding. Extensive chronic degenerative changes as outlined above. Electronically Signed   By: Nelson Chimes M.D.   On: 12/28/2016 15:31   Ct Cervical Spine Wo Contrast  Result Date: 12/28/2016 CLINICAL DATA:  Found on the floor. Not following commands. Confusion. EXAM: CT HEAD WITHOUT CONTRAST CT CERVICAL SPINE WITHOUT CONTRAST TECHNIQUE: Multidetector CT imaging of the head and cervical spine was performed following the standard protocol without intravenous contrast. Multiplanar CT image reconstructions of the cervical spine were also generated. COMPARISON:  06/05/2016.  03/27/2016. FINDINGS: CT HEAD FINDINGS Brain: Mild age related atrophy. No evidence of old or acute focal infarction, mass lesion, hemorrhage, hydrocephalus or extra-axial collection. Vascular: There is atherosclerotic calcification of the major vessels at the base of the brain. Skull: Normal Sinuses/Orbits: Clear. Orbits normal except for old lamina papyracea fracture on the right. Other: None CT CERVICAL SPINE FINDINGS Alignment: Straightening of the normal cervical lordosis. 3 mm anterolisthesis C7-T1 which is chronic. Skull base and vertebrae: Chronic posterior element fusion at C2-3. Prominent anterior osteophytes which could be associated with dysphagia. Soft tissues and spinal canal: Otherwise negative Disc levels: Degenerative spondylosis C3-4 through C7-T1. Facet arthropathy bilaterally at C7-T1 and on the right at see C3-4 and C4-5. Mild bony foraminal narrowing because of these processes. Upper chest: Negative Other: None IMPRESSION: Head CT:  No acute or traumatic finding.  Normal study for age. Cervical spine CT: No acute or traumatic finding. Extensive chronic degenerative changes as outlined above.  Electronically Signed   By: Nelson Chimes M.D.   On: 12/28/2016 15:31   Dg Chest Portable 1 View  Result Date: 12/28/2016 CLINICAL DATA:  Altered mental status. EXAM: PORTABLE CHEST 1 VIEW COMPARISON:  06/05/2016 FINDINGS: Cardiac silhouette is normal size. No mediastinal or hilar masses. No convincing adenopathy. Left anterior chest wall sequential pacemaker is stable. Clear lungs.  No pleural effusion or pneumothorax. Skeletal structures are grossly intact. IMPRESSION: No active disease. Electronically Signed   By: Lajean Manes M.D.   On: 12/28/2016 13:58  EKG: Independently reviewed. EKG: 97, NSR, RBBB   Assessment/Plan Principal Problem:   Acute metabolic encephalopathy: Multifactorial, possibility of seizure versus acute CVA. Patient was last seen normal last night by family, currently no family member at bedside. Elevated CK with lactic acidosis suggests rhabdomyolysis with prolonged lying on the floor. UA negative for UTI. Patient may have had a syncopal episode leading to the fall.  - Admit for observation, telemetry - Will obtain serial cardiac enzymes, 2-D echo, EEG, neurology consult requested (EDP called)  - Unable to obtain MRI to rule out out stroke as patient has pacemaker. Will defer to neurology for CT angiogram .  - Obtain ammonia level  Active Problems:   History of SVT status post PACEMAKER, PERMANENT - will check magnesium level, keep K ~4, Mg ~2 - 2-D echo ordered - Continue flecainide, Toprol-XL    Polyradiculoneuropathy (HCC)/ CIDP - Neurology consult requested    Fall -Once more stable, PT evaluation     Lactic acidosis - No fevers or chills or any infectious symptoms - UA negative for UTI, chest x-ray clear, no pneumonia - Do not think patient has sepsis, lactic acidosis from dehydration, Or possibly from seizure  - Ordered pro-calcitonin, will continue antibiotics for 24 hours and if no fevers and pro-calcitonin <1, will discontinue antibiotics     Rhabdomyolysis -Likely due to fall and on the floor, continue IV fluid hydration    BPH - Continue Flomax  DVT prophylaxis: lovenox   CODE STATUS: full code   Consults called: neurology   Family Communication: No family member at bedside   Admission status:  observation   Disposition plan: Further plan will depend as patient's clinical course evolves and further radiologic and laboratory data become available.    At the time of admission, it appears that the appropriate admission status for this patient is observation This is judged to be reasonable and necessary in order to provide the required intensity of service to ensure the patient's safety given the presenting symptoms, physical exam findings, and initial radiographic and laboratory data in the context of their chronic comorbidities.  The medical decision making on this patient was of high complexity and the patient is at high risk for clinical deterioration, therefore this is a level 3 visit.     Time Spent on Admission: 65 mins     Ripudeep Rai M.D. [facility] 12/28/2016, 4:43 PM Pager: [phone]  If 7PM-7AM, please contact night-coverage [email or web address] Password [email or web address]

## 2016-12-28 NOTE — Consult Note (Addendum)
NEURO HOSPITALIST CONSULT NOTE   Requesting physician: Dr. Tana Coast  Reason for Consult: AMS  History obtained from:  Family  HPI:                                                                                                                                          Jonathon [name] is an 81 y.o. male with a past medical history significant for CIDP, syncope, diastolic CHF with pacemaker and hypertension who presents to [facility] with AMS. His daughter stated that she spoke to Jonathon [name] last night around 1730 about the events they had planned for today. She said that she got to his house around 1200 today, and both the front and back doors were locked (very unusual for him). Once inside the home, she found Jonathon [name] lying on his back on the kitchen floor. He was conscious at this time, confused but quickly recognized his daughter. She said that he was incontinent of urine at that time and with the smell in the home and how dry the urine was, she suspected that he had been on the ground for some time. On further inspection of the home, it looked as though (to her) he had gotten up in haste and fallen on the kitchen floor. His walker was nearby, unlocked. She said that he never went to bed last night. His daughter stated that he is unable to help himself up without a walker, so even if he woke on the floor hours before she saw him, he would not have been able to get up.  Per family, Jonathon [name] does not have a past history of stroke, although his mother did have one. He may or may not have a history of seizure - per his daughter, he has had two episodes in two years where his head turned to the side, his eyes "rolled back" and he became very "rigid". Immediately after these events, he was somnolent, but roused to be fully awake and oriented within a few hours. She states that he has been evaluated for these events, but I could not find record of it. EEG performed for syncopal episode in 2016 revealed  no focal lateralizing or epileptiform features.  At baseline, Jonathon [name] lives alone. He walks with a cane most commonly around the house, but does use a walker. He does not fix most of his meals and his daughter takes care of the bills. He fell in the shower once and does not like to shower when alone in the home, but he does bathe himself. Per family, Jonathon [name] is "sharp as a tack". He does not drive because he "doesn't want to hurt anyone else" should he have an accident due to his neuropathy and seizure-like events. Per family, he manages all  of his medications. It is unclear if he has been compliant.  On my visit, he is pleasant and confused. When asked what happened, he began a story about his "high blood pressure" and being "concerned". Not oriented to location, year, age. He can not name most objects. He follows most commands.  Daughter, sister, niece, son-in-law present at bedside  Pertinent Medications Aspirin 325mg  daily Zosyn and Vanc per PCCM  Pertinent Imaging/Diagnostics CT head, CT C-Spine 12/28/16: No acute or traumatic finding. EEG 08/11/2014: no focal lateralizing or epileptiform features  Past Medical History:  Diagnosis Date  . Asthma   . BENIGN PROSTATIC HYPERTROPHY, HX OF 10/25/2008  . BRADYCARDIA 2005  . Chronic diastolic congestive heart failure ([place]) 06/05/2016  . CIDP (chronic inflammatory demyelinating polyneuropathy) ([place])   . HYPERTENSION 11/21/2006  . Iron deficiency anemia, unspecified 04/19/2013  . 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 PACEMAKER, PERMANENT 2005   Gen change 2014 Medtronic Adaptic L dual-chamber pacemaker, serial [id number]   . SVT (supraventricular tachycardia) ([place])   . TOBACCO ABUSE 10/25/2008   Quit 2012    Past Surgical History:  Procedure Laterality Date  . COLONOSCOPY  [id number]  . PACEMAKER INSERTION  2005  . PERMANENT PACEMAKER GENERATOR CHANGE N/A 06/19/2012   Procedure: PERMANENT PACEMAKER GENERATOR  CHANGE;  Surgeon: [name], MD; Medtronic Adaptic L dual-chamber pacemaker, serial #[id number]    . SKIN GRAFT Right 1962   wrist    Family History  Problem Relation Age of Onset  . Heart attack Father        Died, 57  . Kidney disease Father   . Parkinson's disease Mother        Died, 72  . Breast cancer Sister        Living, 72  . Diabetes type II Brother        Living, 63  . Breast cancer Sister        Living, 69  . Diabetes Daughter        Living, 44  . Diabetes Son        Living, 19  . Ovarian cancer Daughter   . Colon cancer Neg Hx   . Rectal cancer Neg Hx   . Stomach cancer Neg Hx     Social History:  reports that he quit smoking about 6 years ago. His smoking use included Cigarettes. He started smoking about 51 years ago. He has a 11.50 pack-year smoking history. He has never used smokeless tobacco. He reports that he does not drink alcohol or use drugs.  No Known Allergies  MEDICATIONS:                                                                                                                   Current Meds  Medication Sig  . b complex vitamins tablet Take 1 tablet by mouth daily as needed (for supplement).   . Cyanocobalamin (VITAMIN B 12 PO) Take 1,000 mg by mouth daily as needed (for supplement).   Marland Kitchen  feeding supplement, ENSURE ENLIVE, (ENSURE ENLIVE) LIQD Take 237 mLs by mouth 2 (two) times daily between meals.  . flecainide (TAMBOCOR) 150 MG tablet TAKE 1/2 TABLET BY MOUTH TWICE DAILY (Patient taking differently: TAKE 75MG  BY MOUTH TWICE DAILY)  . Garlic 4270 MG CAPS Take 2,000 mg by mouth daily as needed (supplement).   . irbesartan (AVAPRO) 300 MG tablet Take 1 tablet (300 mg total) by mouth daily.  . metoprolol succinate (TOPROL XL) 25 MG 24 hr tablet Take 1 tablet (25 mg total) by mouth daily.  . Omega-3 Fatty Acids (FISH OIL) 1000 MG CAPS Take 1 capsule by mouth daily as needed (for supplement).   . tamsulosin (FLOMAX) 0.4 MG CAPS capsule TAKE 1  CAPSULE BY MOUTH EVERY DAY (Patient taking differently: TAKE 0.4MG  BY MOUTH EVERY DAY)  . Vitamin D, Ergocalciferol, (DRISDOL) 50000 units CAPS capsule Take 1 capsule (50,000 Units total) by mouth every 7 (seven) days.     Review Of Systems:                                                                                                           History obtained from unobtainable from patient due to mental status  Blood pressure (!) 193/105, pulse (!) 107, temperature 98.4 F (36.9 C), temperature source Rectal, resp. rate 18, SpO2 100 %.   Physical Examination:                                                                                                      General: WDWN male. Appears calm and comfortable in bed HEENT:  Normocephalic, no lesions, without obvious abnormality.  Normal external eye and conjunctiva.  Normal external ears. Normal external nose, mucus membranes and septum.  Normal pharynx. Cardiovascular: pulses palpable throughout   Pulmonary: Unlabored Abdomen: Soft Extremities: no joint deformities, effusion, or inflammation. No edema Musculoskeletal: no joint tenderness, deformity or swelling  Skin: warm and dry, no hyperpigmentation, vitiligo, or suspicious lesions  Neurological Examination:                                                                                               Mental Status: Jonathon [name] is alert, oriented only to self and birthday.  Speech fluent with some.  Able to follow most simple commands without difficulty. Cranial Nerves: II: Pupils are equal, round, reactive to light. III,IV, VI: Ptosis not present, extra-ocular muscle movements intact bilaterally V,VII: Smile and eyebrow raise is symmetric. Facial light touch and pinprick sensation intact bilaterally VIII: Hearing grossly intact IX,X: Uvula and palate rise symmetrically XI: SCM and bilateral shoulder shrug strength 5/5 XII: Midline tongue extension Motor: Right :     Upper extremity    5/5   Left:     Upper extremity   5/5          Lower extremity   5/5     Lower extremity   5/5 Pronator drift not present Sensory: Pinprick and light touch intact throughout, bilaterally Deep Tendon Reflexes: Diminished and symmetric throughout. No pathologic reflexes Plantars: Right: downgoing   Left: downgoing Cerebellar: Limbs move without ataxia x 4 Gait: Deferred   Lab Results: Basic Metabolic Panel:  Recent Labs Lab 12/28/16 1335  NA 138  K 3.7  CL 104  CO2 23  GLUCOSE 89  BUN 11  CREATININE 1.22  CALCIUM 9.1    Liver Function Tests:  Recent Labs Lab 12/28/16 1335  AST 97*  ALT 48  ALKPHOS 72  BILITOT 1.1  PROT 8.5*  ALBUMIN 3.2*   No results for input(s): LIPASE, AMYLASE in the last 168 hours. No results for input(s): AMMONIA in the last 168 hours.  CBC:  Recent Labs Lab 12/28/16 1335  WBC 8.9  NEUTROABS 7.3  HGB 12.1*  HCT 36.4*  MCV 86.9  PLT 172    Cardiac Enzymes:  Recent Labs Lab 12/28/16 1335  CKTOTAL 720*    Lipid Panel: No results for input(s): CHOL, TRIG, HDL, CHOLHDL, VLDL, LDLCALC in the last 168 hours.  CBG: No results for input(s): GLUCAP in the last 168 hours.  Microbiology: Results for orders placed or performed during the hospital encounter of 06/05/16  Blood Culture (routine x 2)     Status: None   Collection Time: 06/05/16 11:03 AM  Result Value Ref Range Status   Specimen Description BLOOD LEFT ANTECUBITAL  Final   Special Requests BOTTLES DRAWN AEROBIC AND ANAEROBIC 5CC  Final   Culture NO GROWTH 5 DAYS  Final   Report Status 06/10/2016 FINAL  Final  Blood Culture (routine x 2)     Status: None   Collection Time: 06/05/16 11:06 AM  Result Value Ref Range Status   Specimen Description BLOOD RIGHT ANTECUBITAL  Final   Special Requests BOTTLES DRAWN AEROBIC AND ANAEROBIC 5CC  Final   Culture NO GROWTH 5 DAYS  Final   Report Status 06/10/2016 FINAL  Final  Urine culture     Status: None   Collection Time:  06/05/16  1:39 PM  Result Value Ref Range Status   Specimen Description URINE, CLEAN CATCH  Final   Special Requests NONE  Final   Culture NO GROWTH  Final   Report Status 06/06/2016 FINAL  Final  Respiratory Panel by PCR     Status: None   Collection Time: 06/05/16  5:22 PM  Result Value Ref Range Status   Adenovirus NOT DETECTED NOT DETECTED Final   Coronavirus 229E NOT DETECTED NOT DETECTED Final   Coronavirus HKU1 NOT DETECTED NOT DETECTED Final   Coronavirus NL63 NOT DETECTED NOT DETECTED Final   Coronavirus OC43 NOT DETECTED NOT DETECTED Final   Metapneumovirus NOT DETECTED NOT DETECTED Final   Rhinovirus / Enterovirus NOT DETECTED NOT DETECTED Final   Influenza A NOT  DETECTED NOT DETECTED Final   Influenza B NOT DETECTED NOT DETECTED Final   Parainfluenza Virus 1 NOT DETECTED NOT DETECTED Final   Parainfluenza Virus 2 NOT DETECTED NOT DETECTED Final   Parainfluenza Virus 3 NOT DETECTED NOT DETECTED Final   Parainfluenza Virus 4 NOT DETECTED NOT DETECTED Final   Respiratory Syncytial Virus NOT DETECTED NOT DETECTED Final   Bordetella pertussis NOT DETECTED NOT DETECTED Final   Chlamydophila pneumoniae NOT DETECTED NOT DETECTED Final   Mycoplasma pneumoniae NOT DETECTED NOT DETECTED Final    Coagulation Studies: No results for input(s): LABPROT, INR in the last 72 hours.  Imaging: Ct Head Wo Contrast  Result Date: 12/28/2016 CLINICAL DATA:  Found on the floor. Not following commands. Confusion. EXAM: CT HEAD WITHOUT CONTRAST CT CERVICAL SPINE WITHOUT CONTRAST TECHNIQUE: Multidetector CT imaging of the head and cervical spine was performed following the standard protocol without intravenous contrast. Multiplanar CT image reconstructions of the cervical spine were also generated. COMPARISON:  06/05/2016.  03/27/2016. FINDINGS: CT HEAD FINDINGS Brain: Mild age related atrophy. No evidence of old or acute focal infarction, mass lesion, hemorrhage, hydrocephalus or extra-axial  collection. Vascular: There is atherosclerotic calcification of the major vessels at the base of the brain. Skull: Normal Sinuses/Orbits: Clear. Orbits normal except for old lamina papyracea fracture on the right. Other: None CT CERVICAL SPINE FINDINGS Alignment: Straightening of the normal cervical lordosis. 3 mm anterolisthesis C7-T1 which is chronic. Skull base and vertebrae: Chronic posterior element fusion at C2-3. Prominent anterior osteophytes which could be associated with dysphagia. Soft tissues and spinal canal: Otherwise negative Disc levels: Degenerative spondylosis C3-4 through C7-T1. Facet arthropathy bilaterally at C7-T1 and on the right at see C3-4 and C4-5. Mild bony foraminal narrowing because of these processes. Upper chest: Negative Other: None IMPRESSION: Head CT:  No acute or traumatic finding.  Normal study for age. Cervical spine CT: No acute or traumatic finding. Extensive chronic degenerative changes as outlined above. Electronically Signed   By: Nelson Chimes M.D.   On: 12/28/2016 15:31   Ct Cervical Spine Wo Contrast  Result Date: 12/28/2016 CLINICAL DATA:  Found on the floor. Not following commands. Confusion. EXAM: CT HEAD WITHOUT CONTRAST CT CERVICAL SPINE WITHOUT CONTRAST TECHNIQUE: Multidetector CT imaging of the head and cervical spine was performed following the standard protocol without intravenous contrast. Multiplanar CT image reconstructions of the cervical spine were also generated. COMPARISON:  06/05/2016.  03/27/2016. FINDINGS: CT HEAD FINDINGS Brain: Mild age related atrophy. No evidence of old or acute focal infarction, mass lesion, hemorrhage, hydrocephalus or extra-axial collection. Vascular: There is atherosclerotic calcification of the major vessels at the base of the brain. Skull: Normal Sinuses/Orbits: Clear. Orbits normal except for old lamina papyracea fracture on the right. Other: None CT CERVICAL SPINE FINDINGS Alignment: Straightening of the normal cervical  lordosis. 3 mm anterolisthesis C7-T1 which is chronic. Skull base and vertebrae: Chronic posterior element fusion at C2-3. Prominent anterior osteophytes which could be associated with dysphagia. Soft tissues and spinal canal: Otherwise negative Disc levels: Degenerative spondylosis C3-4 through C7-T1. Facet arthropathy bilaterally at C7-T1 and on the right at see C3-4 and C4-5. Mild bony foraminal narrowing because of these processes. Upper chest: Negative Other: None IMPRESSION: Head CT:  No acute or traumatic finding.  Normal study for age. Cervical spine CT: No acute or traumatic finding. Extensive chronic degenerative changes as outlined above. Electronically Signed   By: Nelson Chimes M.D.   On: 12/28/2016 15:31   Dg Chest Portable  1 View  Result Date: 12/28/2016 CLINICAL DATA:  Altered mental status. EXAM: PORTABLE CHEST 1 VIEW COMPARISON:  06/05/2016 FINDINGS: Cardiac silhouette is normal size. No mediastinal or hilar masses. No convincing adenopathy. Left anterior chest wall sequential pacemaker is stable. Clear lungs.  No pleural effusion or pneumothorax. Skeletal structures are grossly intact. IMPRESSION: No active disease. Electronically Signed   By: Lajean Manes M.D.   On: 12/28/2016 13:58    Assessment  Jonathon [name] is a 81 year old man with a past medical history that includes CIDP, CHF with pacemaker and is concerning for seizure per family report. It is unclear why he fell - ?syncopal episode, ?seizure, ?cardiogenic, ?trip/fall, ?stroke. If he does have an underlying seizure condition, an infection would further lower his seizure threshold and could instigate an event. Pacemaker interrogation, EEG and resolution of the infectious process will be revealing.  Plan: 1) Suggest pacemaker interrogation - is it MRI compatible? 2) MRI if able  3) EEG 4) Correction of metabolic derangements per primary team 5) We will continue to follow   Solon Augusta PA-C [facility]  12/28/2016, 4:56 PM  NEUROHOSPITALIST ADDENDUM Seen and examined the patient this AM. Formulated plan as documented above. Recommendations as above.  Patient has multiple risks for being found down confused an dnot sufficent eveidence to start him on seizure medicatins. Also if he did have seizure, it was likely provoked 2/2 sepsis. Will obtain EEG, hopefully MRI  and hold off staring seizure medications at his time.    Karena Addison Aroor MD [facility] [phone]  If 7pm to 7am, please call on call as listed on AMION.

## 2016-12-28 NOTE — ED Notes (Signed)
[facility] contacted to activate Code Sepsis 

## 2016-12-28 NOTE — ED Triage Notes (Signed)
Pt arrived by [facility]. Last seen normal last night. Family found pt lying on kitchen floor this am altered. Pt appears confused and nonverbal, will follow some commands. Incontinent pta, no hx of seizures.

## 2016-12-29 ENCOUNTER — Observation Stay (HOSPITAL_COMMUNITY): Payer: Medicare Other

## 2016-12-29 DIAGNOSIS — I110 Hypertensive heart disease with heart failure: Secondary | ICD-10-CM | POA: Diagnosis present

## 2016-12-29 DIAGNOSIS — I340 Nonrheumatic mitral (valve) insufficiency: Secondary | ICD-10-CM | POA: Diagnosis not present

## 2016-12-29 DIAGNOSIS — E860 Dehydration: Secondary | ICD-10-CM | POA: Diagnosis present

## 2016-12-29 DIAGNOSIS — T796XXA Traumatic ischemia of muscle, initial encounter: Secondary | ICD-10-CM | POA: Diagnosis present

## 2016-12-29 DIAGNOSIS — M6282 Rhabdomyolysis: Secondary | ICD-10-CM | POA: Diagnosis not present

## 2016-12-29 DIAGNOSIS — N400 Enlarged prostate without lower urinary tract symptoms: Secondary | ICD-10-CM | POA: Diagnosis not present

## 2016-12-29 DIAGNOSIS — Z79899 Other long term (current) drug therapy: Secondary | ICD-10-CM | POA: Diagnosis not present

## 2016-12-29 DIAGNOSIS — Z87891 Personal history of nicotine dependence: Secondary | ICD-10-CM | POA: Diagnosis not present

## 2016-12-29 DIAGNOSIS — R4182 Altered mental status, unspecified: Secondary | ICD-10-CM | POA: Diagnosis not present

## 2016-12-29 DIAGNOSIS — G6181 Chronic inflammatory demyelinating polyneuritis: Secondary | ICD-10-CM | POA: Diagnosis present

## 2016-12-29 DIAGNOSIS — G9340 Encephalopathy, unspecified: Secondary | ICD-10-CM | POA: Diagnosis not present

## 2016-12-29 DIAGNOSIS — Z950 Presence of cardiac pacemaker: Secondary | ICD-10-CM | POA: Diagnosis not present

## 2016-12-29 DIAGNOSIS — G9341 Metabolic encephalopathy: Secondary | ICD-10-CM | POA: Diagnosis present

## 2016-12-29 DIAGNOSIS — E872 Acidosis: Secondary | ICD-10-CM | POA: Diagnosis not present

## 2016-12-29 DIAGNOSIS — D509 Iron deficiency anemia, unspecified: Secondary | ICD-10-CM | POA: Diagnosis present

## 2016-12-29 DIAGNOSIS — R5381 Other malaise: Secondary | ICD-10-CM | POA: Diagnosis not present

## 2016-12-29 DIAGNOSIS — G610 Guillain-Barre syndrome: Secondary | ICD-10-CM | POA: Diagnosis not present

## 2016-12-29 DIAGNOSIS — W19XXXA Unspecified fall, initial encounter: Secondary | ICD-10-CM | POA: Diagnosis not present

## 2016-12-29 DIAGNOSIS — E876 Hypokalemia: Secondary | ICD-10-CM | POA: Diagnosis not present

## 2016-12-29 DIAGNOSIS — R55 Syncope and collapse: Secondary | ICD-10-CM | POA: Diagnosis not present

## 2016-12-29 DIAGNOSIS — R748 Abnormal levels of other serum enzymes: Secondary | ICD-10-CM | POA: Diagnosis present

## 2016-12-29 DIAGNOSIS — I5032 Chronic diastolic (congestive) heart failure: Secondary | ICD-10-CM | POA: Diagnosis present

## 2016-12-29 LAB — BASIC METABOLIC PANEL
Anion gap: 9 (ref 5–15)
BUN: 5 mg/dL — AB (ref 6–20)
CALCIUM: 8.1 mg/dL — AB (ref 8.9–10.3)
CHLORIDE: 105 mmol/L (ref 101–111)
CO2: 23 mmol/L (ref 22–32)
CREATININE: 0.9 mg/dL (ref 0.61–1.24)
GFR calc Af Amer: >60 mL/min (ref >60)
Glucose, Bld: 105 mg/dL — ABNORMAL HIGH (ref 65–99)
Potassium: 3.3 mmol/L — ABNORMAL LOW (ref 3.5–5.1)
SODIUM: 137 mmol/L (ref 135–145)

## 2016-12-29 LAB — CBC
HCT: 31.6 % — ABNORMAL LOW (ref 39.0–52.0)
Hemoglobin: 10.5 g/dL — ABNORMAL LOW (ref 13.0–17.0)
MCH: 28.9 pg (ref 26.0–34.0)
MCHC: 33.2 g/dL (ref 30.0–36.0)
MCV: 87.1 fL (ref 78.0–100.0)
PLATELETS: 151 10*3/uL (ref 150–400)
RBC: 3.63 MIL/uL — ABNORMAL LOW (ref 4.22–5.81)
RDW: 15.2 % (ref 11.5–15.5)
WBC: 5.3 10*3/uL (ref 4.0–10.5)

## 2016-12-29 LAB — TROPONIN I
TROPONIN I: 0.06 ng/mL — AB (ref <0.03)
Troponin I: 0.07 ng/mL (ref <0.03)

## 2016-12-29 LAB — LACTIC ACID, PLASMA: LACTIC ACID, VENOUS: 1.3 mmol/L (ref 0.5–1.9)

## 2016-12-29 MED ORDER — POTASSIUM CHLORIDE CRYS ER 20 MEQ PO TBCR
60.0000 meq | EXTENDED_RELEASE_TABLET | Freq: Once | ORAL | Status: AC
Start: 2016-12-29 — End: 2016-12-29
  Administered 2016-12-29: 60 meq via ORAL
  Filled 2016-12-29: qty 3

## 2016-12-29 NOTE — Progress Notes (Signed)
Subjective: Jonathon [name] was sitting in bed on my arrival, two of his sisters at bedside. He was awake, alert and oriented. He had no recollection of the event that made him fall, but does remember his daughter calling him when she arrived at his house yesterday. He made a comment that "EMS told me I had seizures" and that he had two more "seizures" in the ambulance, but I can not find any record of this.  Pertinent Medications Aspirin 325mg  daily Zosyn and Vanc per PCCM Trazodone 50mg  qhs  Pertinent Imaging/Diagnostics CT head, CT C-Spine 12/28/16: No acute or traumatic finding. EEG 08/11/2014: no focal lateralizing or epileptiform features MRI brain pending  Physical Examination: Vitals:   12/28/16 2056 12/29/16 0549  BP: (!) 169/84 (!) 147/79  Pulse: 83 (!) 133  Resp: 19 18  Temp: 99.5 F (37.5 C) 98.7 F (37.1 C)  SpO2: 100% 100%    General: WDWN male. Appears calm and comfortable in bed HEENT:  Normocephalic, no lesions, without obvious abnormality.  Normal external eye and conjunctiva.  Normal external ears. Normal external nose, mucus membranes and septum.  Normal pharynx. Cardiovascular: pulses palpable throughout   Pulmonary: Unlabored Abdomen: Soft Extremities: no joint deformities, effusion, or inflammation. No edema Musculoskeletal: no joint tenderness, deformity or swelling  Skin: warm and dry, no hyperpigmentation, vitiligo, or suspicious lesions  Neurological Examination:  Mental Status: Jonathon [name] is alert, oriented x 4. Able to follow commands without difficulty. CN: Pupils are equal, round and symmetrically reactive from 3-->2 mm. EOMI without nystagmus. Facial sensation is intact to light touch. Face is symmetric at rest with normal strength and mobility. Hearing is intact to conversational voice. Palate elevates symmetrically and uvula is midline. Voice is normal in tone, pitch and quality. Bilateral SCM and trapezii are 5/5. Tongue is midline with normal bulk and  mobility.  Motor: Normal bulk, slightly increased tone. Strength. 5/5 throughout except 4/5 left hip flexion. No drift.  Sensation: Intact to light touch.  DTRs: Diminished and symmetric throughout. No pathologic reflexes. Toes downgoing bilaterally.   Assessment: Jonathon [name] is a pleasant 81 year old male who presented to [facility] [place] by EMS after being found down by family. On my initial visit, he was alert but only oriented to himself, unaware of his location and year. A code sepsis was called and neurology was consulted to AMS. Differentials at this point are still broad - ?syncopal episode, ?seizure, ?cardiogenic, ?trip/fall, ?stroke.  At this time, he is neurologically intact but we will continue with workup as planned to narrow differential and assist with further treatment measures. He is unable to get an MRI due to his pacemaker. Consider outpatient CT per primary neurologist's discretion.  Recommendations: 1) EEG 2) Orthostatics 3) Correction of metabolic derangements per primary team 4) We will continue to follow   Solon Augusta PA-C [facility] [phone]  12/29/2016, 10:35 AM  NEUROHOSPITALIST ADDENDUM Seen and examined the patient this AM. Formulated plan as documented above. Recommendations as above. Will follow.  Karena Addison Tahliyah Anagnos MD [facility] [phone]  If 7pm to 7am, please call on call as listed on AMION.

## 2016-12-29 NOTE — Progress Notes (Signed)
Patient ID: Jonathon [name], male   DOB: [birth year], 81 y.o.   MRN: [id number]  PROGRESS NOTE    BRAILYN DELMAN  QPR:[id number] DOB: [birth year] DOA: 12/28/2016 PCP: Marletta Lor, MD   Brief Narrative:  81 year old male with history of BPH, chronic diastolic heart failure, CIDP, hypertension, iron deficiency anemia, SVT status post pacemaker presented with altered mental status and confusion. He was found to have elevated lactic acid with unremarkable initial CT scan of the head. Neurology has been consulted and empiric antibiotics were started.  Assessment & Plan:   Principal Problem:   Acute encephalopathy Active Problems:   PACEMAKER, PERMANENT   Polyradiculoneuropathy (HCC)   Syncope   Fall   Lactic acidosis   Acute metabolic encephalopathy -Questionable cause. Mental status has much improved. - Neurology evaluation appreciated. Awaiting MRI of the brain if compatible with pacemaker. Awaiting EEG and 2-D echo. - Unclear if the patient had syncopal episode as well. Orthostatic vitals, carotid ultrasound. Fall precautions. - SLP evaluation. PT/OT evaluation.   Lactic acidosis - Improved. Probably secondary to fall and dehydration. No signs of infection. Discontinue antibiotics and monitor     History of SVT status post PACEMAKER, PERMANENT -  Continue flecainide, Toprol-XL - Monitor heart rate. Follow-up 2-D echo - Repeat his potassium  Mildly elevated troponin Probably secondary to elevated CK and fall with lactic acidosis. No chest pain. Follow 2-D echo  History of CIDP - Follow neurology recommendations  Hypokalemia - Replace potassium. A.m. labs    Fall -Fall precautions. PT evaluation   Elevated CK level -Likely due to fall and on the floor, continue IV fluid hydration   - Repeat a.m. CK  BPH - Continue Flomax  DVT prophylaxis: lovenox   CODE STATUS: full code   Consultants: neurology  Family Communication: None at bedside Disposition Plan:  Depends on clinical outcome   Procedures: None  Antimicrobials: Vancomycin and Zosyn from 12/28/2016    Subjective: Patient seen and examined at bedside. He is more awake and alert and answers some questions but is still slightly confused. No overnight fever or vomiting reported  Objective: Vitals:   12/28/16 1830 12/28/16 2056 12/29/16 0549 12/29/16 1345  BP: (!) 180/110 (!) 169/84 (!) 147/79 (!) 146/80  Pulse:  83 (!) 133 69  Resp:  19 18 20   Temp:  99.5 F (37.5 C) 98.7 F (37.1 C) 98.2 F (36.8 C)  TempSrc:  Oral Oral Oral  SpO2:  100% 100% 100%    Intake/Output Summary (Last 24 hours) at 12/29/16 1353 Last data filed at 12/29/16 0617  Gross per 24 hour  Intake          4793.33 ml  Output              682 ml  Net          4111.33 ml   There were no vitals filed for this visit.  Examination:  General exam: Appears calm and comfortable  Respiratory system: Bilateral decreased breath sound at bases  Cardiovascular system: S1 & S2 heard, Intermittent tachycardia  Gastrointestinal system: Abdomen is nondistended, soft and nontender. Normal bowel sounds heard. Central nervous system: Alert and oriented still slightly confused. No focal neurological deficits. Moving extremities Extremities: No cyanosis, clubbing, edema  Skin: No rashes, lesions or ulcers Lymph: No cervical lymphadenopathy    Data Reviewed: I have personally reviewed following labs and imaging studies  CBC:  Recent Labs Lab 12/28/16 1335 12/28/16 1850 12/29/16 0548  WBC 8.9  7.9 5.3  NEUTROABS 7.3  --   --   HGB 12.1* 11.3* 10.5*  HCT 36.4* 34.5* 31.6*  MCV 86.9 87.6 87.1  PLT 172 168 517   Basic Metabolic Panel:  Recent Labs Lab 12/28/16 1335 12/28/16 1850 12/29/16 0548  NA 138  --  137  K 3.7  --  3.3*  CL 104  --  105  CO2 23  --  23  GLUCOSE 89  --  105*  BUN 11  --  5*  CREATININE 1.22 1.05 0.90  CALCIUM 9.1  --  8.1*   GFR: Estimated Creatinine Clearance: 69.8 mL/min  (by C-G formula based on SCr of 0.9 mg/dL). Liver Function Tests:  Recent Labs Lab 12/28/16 1335  AST 97*  ALT 48  ALKPHOS 72  BILITOT 1.1  PROT 8.5*  ALBUMIN 3.2*   No results for input(s): LIPASE, AMYLASE in the last 168 hours.  Recent Labs Lab 12/28/16 1726  AMMONIA 36*   Coagulation Profile: No results for input(s): INR, PROTIME in the last 168 hours. Cardiac Enzymes:  Recent Labs Lab 12/28/16 1335 12/28/16 1850 12/29/16 0023 12/29/16 0548  CKTOTAL 720*  --   --   --   TROPONINI  --  0.03* 0.06* 0.07*   BNP (last 3 results) No results for input(s): PROBNP in the last 8760 hours. HbA1C: No results for input(s): HGBA1C in the last 72 hours. CBG: No results for input(s): GLUCAP in the last 168 hours. Lipid Profile: No results for input(s): CHOL, HDL, LDLCALC, TRIG, CHOLHDL, LDLDIRECT in the last 72 hours. Thyroid Function Tests: No results for input(s): TSH, T4TOTAL, FREET4, T3FREE, THYROIDAB in the last 72 hours. Anemia Panel: No results for input(s): VITAMINB12, FOLATE, FERRITIN, TIBC, IRON, RETICCTPCT in the last 72 hours. Sepsis Labs:  Recent Labs Lab 12/28/16 1341 12/28/16 1726 12/29/16 0548  PROCALCITON  --  0.10  --   LATICACIDVEN 6.45* 3.2* 1.3    No results found for this or any previous visit (from the past 240 hour(s)).       Radiology Studies: Ct Head Wo Contrast  Result Date: 12/28/2016 CLINICAL DATA:  Found on the floor. Not following commands. Confusion. EXAM: CT HEAD WITHOUT CONTRAST CT CERVICAL SPINE WITHOUT CONTRAST TECHNIQUE: Multidetector CT imaging of the head and cervical spine was performed following the standard protocol without intravenous contrast. Multiplanar CT image reconstructions of the cervical spine were also generated. COMPARISON:  06/05/2016.  03/27/2016. FINDINGS: CT HEAD FINDINGS Brain: Mild age related atrophy. No evidence of old or acute focal infarction, mass lesion, hemorrhage, hydrocephalus or extra-axial  collection. Vascular: There is atherosclerotic calcification of the major vessels at the base of the brain. Skull: Normal Sinuses/Orbits: Clear. Orbits normal except for old lamina papyracea fracture on the right. Other: None CT CERVICAL SPINE FINDINGS Alignment: Straightening of the normal cervical lordosis. 3 mm anterolisthesis C7-T1 which is chronic. Skull base and vertebrae: Chronic posterior element fusion at C2-3. Prominent anterior osteophytes which could be associated with dysphagia. Soft tissues and spinal canal: Otherwise negative Disc levels: Degenerative spondylosis C3-4 through C7-T1. Facet arthropathy bilaterally at C7-T1 and on the right at see C3-4 and C4-5. Mild bony foraminal narrowing because of these processes. Upper chest: Negative Other: None IMPRESSION: Head CT:  No acute or traumatic finding.  Normal study for age. Cervical spine CT: No acute or traumatic finding. Extensive chronic degenerative changes as outlined above. Electronically Signed   By: Nelson Chimes M.D.   On: 12/28/2016 15:31  Ct Cervical Spine Wo Contrast  Result Date: 12/28/2016 CLINICAL DATA:  Found on the floor. Not following commands. Confusion. EXAM: CT HEAD WITHOUT CONTRAST CT CERVICAL SPINE WITHOUT CONTRAST TECHNIQUE: Multidetector CT imaging of the head and cervical spine was performed following the standard protocol without intravenous contrast. Multiplanar CT image reconstructions of the cervical spine were also generated. COMPARISON:  06/05/2016.  03/27/2016. FINDINGS: CT HEAD FINDINGS Brain: Mild age related atrophy. No evidence of old or acute focal infarction, mass lesion, hemorrhage, hydrocephalus or extra-axial collection. Vascular: There is atherosclerotic calcification of the major vessels at the base of the brain. Skull: Normal Sinuses/Orbits: Clear. Orbits normal except for old lamina papyracea fracture on the right. Other: None CT CERVICAL SPINE FINDINGS Alignment: Straightening of the normal cervical  lordosis. 3 mm anterolisthesis C7-T1 which is chronic. Skull base and vertebrae: Chronic posterior element fusion at C2-3. Prominent anterior osteophytes which could be associated with dysphagia. Soft tissues and spinal canal: Otherwise negative Disc levels: Degenerative spondylosis C3-4 through C7-T1. Facet arthropathy bilaterally at C7-T1 and on the right at see C3-4 and C4-5. Mild bony foraminal narrowing because of these processes. Upper chest: Negative Other: None IMPRESSION: Head CT:  No acute or traumatic finding.  Normal study for age. Cervical spine CT: No acute or traumatic finding. Extensive chronic degenerative changes as outlined above. Electronically Signed   By: Nelson Chimes M.D.   On: 12/28/2016 15:31   Dg Chest Portable 1 View  Result Date: 12/28/2016 CLINICAL DATA:  Altered mental status. EXAM: PORTABLE CHEST 1 VIEW COMPARISON:  06/05/2016 FINDINGS: Cardiac silhouette is normal size. No mediastinal or hilar masses. No convincing adenopathy. Left anterior chest wall sequential pacemaker is stable. Clear lungs.  No pleural effusion or pneumothorax. Skeletal structures are grossly intact. IMPRESSION: No active disease. Electronically Signed   By: Lajean Manes M.D.   On: 12/28/2016 13:58        Scheduled Meds: . aspirin  325 mg Oral Once   Or  . aspirin  300 mg Rectal Once  . enoxaparin (LOVENOX) injection  40 mg Subcutaneous Q24H  . feeding supplement (ENSURE ENLIVE)  237 mL Oral BID BM  . flecainide  75 mg Oral BID  . metoprolol succinate  25 mg Oral Daily  . tamsulosin  0.4 mg Oral Daily  . traZODone  50 mg Oral QHS  . [START ON 01/02/2017] Vitamin D (Ergocalciferol)  50,000 Units Oral Q7 days   Continuous Infusions: . sodium chloride 100 mL/hr at 12/29/16 1024  . piperacillin-tazobactam (ZOSYN)  IV Stopped (12/29/16 0900)  . vancomycin 750 mg (12/29/16 0454)     LOS: 0 days        Aline August, MD [facility] Pager [phone]  If 7PM-7AM, please  contact night-coverage [email or web address] Password [email or web address] 12/29/2016, 1:53 PM

## 2016-12-30 ENCOUNTER — Inpatient Hospital Stay (HOSPITAL_COMMUNITY): Payer: Medicare Other

## 2016-12-30 DIAGNOSIS — G9340 Encephalopathy, unspecified: Secondary | ICD-10-CM

## 2016-12-30 DIAGNOSIS — I340 Nonrheumatic mitral (valve) insufficiency: Secondary | ICD-10-CM

## 2016-12-30 DIAGNOSIS — R55 Syncope and collapse: Secondary | ICD-10-CM

## 2016-12-30 LAB — CBC WITH DIFFERENTIAL/PLATELET
Basophils Absolute: 0 10*3/uL (ref 0.0–0.1)
Basophils Relative: 1 %
EOS ABS: 0 10*3/uL (ref 0.0–0.7)
EOS PCT: 1 %
HCT: 31.2 % — ABNORMAL LOW (ref 39.0–52.0)
Hemoglobin: 10.3 g/dL — ABNORMAL LOW (ref 13.0–17.0)
LYMPHS ABS: 2.2 10*3/uL (ref 0.7–4.0)
Lymphocytes Relative: 42 %
MCH: 28.7 pg (ref 26.0–34.0)
MCHC: 33 g/dL (ref 30.0–36.0)
MCV: 86.9 fL (ref 78.0–100.0)
Monocytes Absolute: 0.7 10*3/uL (ref 0.1–1.0)
Monocytes Relative: 14 %
Neutro Abs: 2.1 10*3/uL (ref 1.7–7.7)
Neutrophils Relative %: 42 %
Platelets: 151 10*3/uL (ref 150–400)
RBC: 3.59 MIL/uL — ABNORMAL LOW (ref 4.22–5.81)
RDW: 15.3 % (ref 11.5–15.5)
WBC: 5.1 10*3/uL (ref 4.0–10.5)

## 2016-12-30 LAB — COMPREHENSIVE METABOLIC PANEL
ALT: 36 U/L (ref 17–63)
AST: 108 U/L — ABNORMAL HIGH (ref 15–41)
Albumin: 2.5 g/dL — ABNORMAL LOW (ref 3.5–5.0)
Alkaline Phosphatase: 47 U/L (ref 38–126)
Anion gap: 4 — ABNORMAL LOW (ref 5–15)
BUN: 10 mg/dL (ref 6–20)
CO2: 27 mmol/L (ref 22–32)
Calcium: 8.3 mg/dL — ABNORMAL LOW (ref 8.9–10.3)
Chloride: 107 mmol/L (ref 101–111)
Creatinine, Ser: 0.96 mg/dL (ref 0.61–1.24)
GFR calc Af Amer: >60 mL/min (ref >60)
GFR calc non Af Amer: >60 mL/min (ref >60)
Glucose, Bld: 94 mg/dL (ref 65–99)
Potassium: 3.7 mmol/L (ref 3.5–5.1)
Sodium: 138 mmol/L (ref 135–145)
Total Bilirubin: 1.1 mg/dL (ref 0.3–1.2)
Total Protein: 7 g/dL (ref 6.5–8.1)

## 2016-12-30 LAB — ECHOCARDIOGRAM COMPLETE

## 2016-12-30 LAB — VAS US CAROTID
LCCADDIAS: -10 cm/s
LCCADSYS: -43 cm/s
LEFT ECA DIAS: -8 cm/s
LEFT VERTEBRAL DIAS: 11 cm/s
LICADDIAS: -20 cm/s
LICADSYS: -62 cm/s
Left CCA prox dias: 13 cm/s
Left CCA prox sys: 93 cm/s
Left ICA prox dias: 15 cm/s
Left ICA prox sys: 64 cm/s
RCCADSYS: -55 cm/s
RCCAPSYS: 66 cm/s
RIGHT ECA DIAS: -12 cm/s
RIGHT VERTEBRAL DIAS: 8 cm/s
Right CCA prox dias: 8 cm/s

## 2016-12-30 LAB — CK: CK TOTAL: 3101 U/L — AB (ref 49–397)

## 2016-12-30 LAB — MAGNESIUM: Magnesium: 1.5 mg/dL — ABNORMAL LOW (ref 1.7–2.4)

## 2016-12-30 MED ORDER — MAGNESIUM SULFATE 2 GM/50ML IV SOLN
2.0000 g | Freq: Once | INTRAVENOUS | Status: AC
  Administered 2016-12-30: 2 g via INTRAVENOUS
  Filled 2016-12-30: qty 50

## 2016-12-30 MED ORDER — HYDRALAZINE HCL 20 MG/ML IJ SOLN
5.0000 mg | Freq: Once | INTRAMUSCULAR | Status: AC
  Administered 2016-12-30: 5 mg via INTRAVENOUS
  Filled 2016-12-30: qty 1

## 2016-12-30 MED ORDER — HYDRALAZINE HCL 20 MG/ML IJ SOLN
10.0000 mg | INTRAMUSCULAR | Status: DC | PRN
  Administered 2016-12-30 – 2017-01-01 (×3): 10 mg via INTRAVENOUS
  Filled 2016-12-30 (×3): qty 1

## 2016-12-30 NOTE — Procedures (Signed)
ELECTROENCEPHALOGRAM REPORT  Date of Study: 12/30/2016  Patient's Name: Jonathon [name] MRN: [id number] Date of Birth: [birth year]  Referring Provider: Dr. Estill Cotta  Clinical History: This is a 81 year old man with altered mental status  Medications: acetaminophen (TYLENOL) tablet 650 mg  aspirin tablet 325 mg  enoxaparin (LOVENOX) injection 40 mg  feeding supplement (ENSURE ENLIVE) (ENSURE ENLIVE) liquid 237 mL  flecainide (TAMBOCOR) tablet 75 mg  metoprolol succinate (TOPROL-XL) 24 hr tablet 25 mg  tamsulosin (FLOMAX) capsule 0.4 mg  Vitamin D (Ergocalciferol) (DRISDOL) capsule 50,000 Units   Technical Summary: A multichannel digital EEG recording measured by the international 10-20 system with electrodes applied with paste and impedances below 5000 ohms performed in our laboratory with EKG monitoring in an awake and drowsy patient.  Hyperventilation and photic stimulation were not performed.  The digital EEG was referentially recorded, reformatted, and digitally filtered in a variety of bipolar and referential montages for optimal display.    Description: The patient is awake and drowsy during the recording.  During maximal wakefulness, there is a symmetric, medium voltage 8.5 Hz posterior dominant rhythm that attenuates with eye opening.  There is near-continuous focal delta slowing over the left hemisphere, at times occurring in a periodic pattern without evolution in frequency or amplitude. Sleep is not captured. Hyperventilation and photic stimulation were not performed.  There were no epileptiform discharges or electrographic seizures seen.    EKG lead was unremarkable.  Impression: This awake and asleep EEG is abnormal due to near-continuous focal slowing over the left hemisphere.  Clinical Correlation of the above findings indicates focal cerebral dysfunction over the left hemisphere suggestive of underlying structural or physiologic abnormality. The absence of epileptiform  discharges does not exclude a clinical diagnosis of epilepsy. Clinical correlation is advised.    Ellouise Newer, M.D.

## 2016-12-30 NOTE — Progress Notes (Signed)
Patient's BP 193/105.  On-call MD. Opyd notified.  Patient asymptomatic.  Will continue to monitor and notify MD as needed.

## 2016-12-30 NOTE — Evaluation (Signed)
Physical Therapy Evaluation Patient Details Name: Jonathon [name] MRN: [id number] DOB: Dec 26, 1935 Today's Date: 12/30/2016   History of Present Illness  pt is a 81 y/o male with pmh significant for BPH, d CHF, CIDP, HTN, iron deficiency anemia, pacer, brought in to ED by family who found pt down in the floor confused.  Clinical Impression  Pt admitted with/for acute encephalopathy.  Pt needing min guard for basic mobility, but more help with mentation.  Pt currently limited functionally due to the problems listed. ( See problems list.)   Pt will benefit from PT to maximize function and safety in order to get ready for next venue listed below.     Follow Up Recommendations SNF;Supervision/Assistance - 24 hour    Equipment Recommendations  None recommended by PT    Recommendations for Other Services       Precautions / Restrictions Precautions Precautions: Fall      Mobility  Bed Mobility Overal bed mobility: Needs Assistance Bed Mobility: Sit to Supine       Sit to supine: Supervision   General bed mobility comments: needed cues for sequencing after asked pt to get in bed.  Transfers Overall transfer level: Needs assistance   Transfers: Sit to/from Stand Sit to Stand: Min guard            Ambulation/Gait Ambulation/Gait assistance: Min guard;Min assist Ambulation Distance (Feet): 160 Feet Assistive device:  (pushing IV pole) Gait Pattern/deviations: Step-through pattern   Gait velocity interpretation: Below normal speed for age/gender General Gait Details: mildly unsteady while using the iv pole, distractable, drifting  Stairs            Wheelchair Mobility    Modified Rankin (Stroke Patients Only)       Balance Overall balance assessment: Needs assistance Sitting-balance support: No upper extremity supported Sitting balance-Leahy Scale: Good     Standing balance support: No upper extremity supported;Single extremity supported Standing  balance-Leahy Scale: Fair                               Pertinent Vitals/Pain      Home Living Family/patient expects to be discharged to:: Private residence Living Arrangements: Alone Available Help at Discharge: Family;Available 24 hours/day (Family might be able to do ST 24 hours, but not days) Type of Home: House Home Access: Stairs to enter Entrance Stairs-Rails: Left;Right (back door) Entrance Stairs-Number of Steps: 3-6 Home Layout: One level Home Equipment: Walker - 4 wheels;Cane - single point;Tub bench;Hand held shower head      Prior Function Level of Independence: Independent with assistive device(s)         Comments: RW for mobility. Daughter drives or pt uses [employer] to grocery store. Pt independent with BADL, recently has only been sponge bathing.     Hand Dominance        Extremity/Trunk Assessment        Lower Extremity Assessment Lower Extremity Assessment: Overall WFL for tasks assessed;Generalized weakness       Communication   Communication: Expressive difficulties  Cognition Arousal/Alertness: Awake/alert Behavior During Therapy: WFL for tasks assessed/performed;Flat affect Overall Cognitive Status: Impaired/Different from baseline Area of Impairment: Memory;Following commands;Awareness;Problem solving                     Memory: Decreased short-term memory Following Commands: Follows one step commands with increased time   Awareness: Intellectual Problem Solving: Slow processing  General Comments General comments (skin integrity, edema, etc.): pt having a very hard time answering home environment questions and after awile stopped trying to.    Exercises     Assessment/Plan    PT Assessment Patient needs continued PT services  PT Problem List Decreased strength;Decreased activity tolerance;Decreased balance;Decreased mobility;Decreased knowledge of use of DME       PT Treatment Interventions DME  instruction;Gait training;Functional mobility training;Stair training;Therapeutic activities;Balance training;Patient/family education    PT Goals (Current goals can be found in the Care Plan section)  Acute Rehab PT Goals Patient Stated Goal: pt unable to set goals PT Goal Formulation: Patient unable to participate in goal setting Time For Goal Achievement: 01/13/17 Potential to Achieve Goals: Good    Frequency Min 3X/week   Barriers to discharge        Co-evaluation               AM-PAC PT "6 Clicks" Daily Activity  Outcome Measure Difficulty turning over in bed (including adjusting bedclothes, sheets and blankets)?: A Little Difficulty moving from lying on back to sitting on the side of the bed? : A Little Difficulty sitting down on and standing up from a chair with arms (e.g., wheelchair, bedside commode, etc,.)?: A Little Help needed moving to and from a bed to chair (including a wheelchair)?: A Little Help needed walking in hospital room?: A Little Help needed climbing 3-5 steps with a railing? : A Little 6 Click Score: 18    End of Session   Activity Tolerance: Patient tolerated treatment well Patient left: in bed;with call bell/phone within reach Nurse Communication: Mobility status PT Visit Diagnosis: Unsteadiness on feet (R26.81);Other abnormalities of gait and mobility (R26.89)    Time: 3559-7416 PT Time Calculation (min) (ACUTE ONLY): 33 min   Charges:   PT Evaluation $PT Eval Moderate Complexity: 1 Mod PT Treatments $Gait Training: 8-22 mins   PT G Codes:        2017-01-03  Donnella Sham, PT [phone]  (pager)  Tessie Fass Antonae Zbikowski January 03, 2017, 5:43 PM

## 2016-12-30 NOTE — Progress Notes (Signed)
Unable to get MRI brain. He has been seen multiple times in this hospital with normal EEGs and CT head. This recent EEG did show Delta waves (slowing) but no epileptiform activity. At this time would not start AED but would have follow up as out patient with neurology for further evaluation and possible ambulatory EEG.  At this time Neurology will S/O.   Etta Quill PA-C [facility] [phone]  M-F  (8:30 am- 4 PM)  12/30/2016, 2:05 PM

## 2016-12-30 NOTE — Progress Notes (Signed)
Patient ID: Jonathon [name], male   DOB: 03-Nov-1935, 81 y.o.   MRN: [id number]  PROGRESS NOTE    Jonathon [name]  CHY:[id number] DOB: Apr 09, 1936 DOA: 12/28/2016 PCP: Marletta Lor, MD   Brief Narrative:  81 year old male with history of BPH, chronic diastolic heart failure, CIDP, hypertension, iron deficiency anemia, SVT status post pacemaker presented with altered mental status and confusion. He was found to have elevated lactic acid with unremarkable initial CT scan of the head. Neurology has been consulted and empiric antibiotics were started. Antibiotics were discontinued on 12/29/2016   Assessment & Plan:   Principal Problem:   Acute encephalopathy Active Problems:   PACEMAKER, PERMANENT   Polyradiculoneuropathy (HCC)   Syncope   Fall   Lactic acidosis   Acute metabolic encephalopathy -Questionable cause. Mental status has much improved but still slightly confused. - Neurology evaluation appreciated. MRI could not be done because of pacemaker.  - Awaiting EEG and 2-D echo. - Unclear if the patient had syncopal episode as well. Follow carotid ultrasound and 2-D echo. Fall precautions. - Follow diet as per SLP recommendations - PT/OT eval  Lactic acidosis - Improved. Probably secondary to fall and dehydration. No signs of infection. Monitoring of antibiotics  History of SVT status post PACEMAKER, PERMANENT -  Continue flecainide, Toprol-XL -  Follow-up 2-D echo  Mildly elevated troponin Probably secondary to elevated CK and fall with lactic acidosis. No chest pain. Follow 2-D echo  History of CIDP - Follow neurology recommendations  Hypokalemia - Improving. Repeat a.m. Labs  Hypomagnesemia - Replace and repeat a.m. labs  Fall -Fall precautions. PT evaluation  Elevated CK level -Likely due to fall and on the floor - Repeat CK trending up today. Increase IV fluids to 125 ML per hour - CK in a.m.  BPH - Continue Flomax  DVT prophylaxis: lovenox     CODE STATUS: full code   Consultants: neurology  Family Communication: None at bedside Disposition Plan:  probable discharge in 1-2 days  Procedures:  2-D echo and carotid ultrasound pending  Antimicrobials: Vancomycin and Zosyn from 12/28/2016- 12/29/2016  Subjective: Patient seen and examined at bedside. She denies any overnight fever, nausea or vomiting. He still feels weak. He can't remember the year.  Objective: Vitals:   12/29/16 2253 12/29/16 2255 12/29/16 2257 12/30/16 0533  BP: (!) 188/96 (!) 180/91 (!) 182/101 (!) 191/93  Pulse: 66 65 68 64  Resp:    18  Temp:    98.2 F (36.8 C)  TempSrc:    Oral  SpO2:    100%    Intake/Output Summary (Last 24 hours) at 12/30/16 1123 Last data filed at 12/30/16 1052  Gross per 24 hour  Intake              342 ml  Output             1201 ml  Net             -859 ml   There were no vitals filed for this visit.  Examination:  General exam: Appears calm and comfortable;Still slightly confused as he doesn't remember the current year  Respiratory system: Bilateral decreased breath sound at bases  Cardiovascular system: S1 & S2 heard,Rate controlled Gastrointestinal system: Abdomen is nondistended, soft and nontender. Normal bowel sounds heard. Central nervous system: No focal neurological deficits. Moving extremities Extremities: No cyanosis, clubbing, edema    Data Reviewed: I have personally reviewed following labs and imaging studies  CBC:  Recent Labs Lab 12/28/16 1335 12/28/16 1850 12/29/16 0548 12/30/16 0327  WBC 8.9 7.9 5.3 5.1  NEUTROABS 7.3  --   --  2.1  HGB 12.1* 11.3* 10.5* 10.3*  HCT 36.4* 34.5* 31.6* 31.2*  MCV 86.9 87.6 87.1 86.9  PLT 172 168 151 222   Basic Metabolic Panel:  Recent Labs Lab 12/28/16 1335 12/28/16 1850 12/29/16 0548 12/30/16 0327  NA 138  --  137 138  K 3.7  --  3.3* 3.7  CL 104  --  105 107  CO2 23  --  23 27  GLUCOSE 89  --  105* 94  BUN 11  --  5* 10   CREATININE 1.22 1.05 0.90 0.96  CALCIUM 9.1  --  8.1* 8.3*  MG  --   --   --  1.5*   GFR: Estimated Creatinine Clearance: 65.5 mL/min (by C-G formula based on SCr of 0.96 mg/dL). Liver Function Tests:  Recent Labs Lab 12/28/16 1335 12/30/16 0327  AST 97* 108*  ALT 48 36  ALKPHOS 72 47  BILITOT 1.1 1.1  PROT 8.5* 7.0  ALBUMIN 3.2* 2.5*   No results for input(s): LIPASE, AMYLASE in the last 168 hours.  Recent Labs Lab 12/28/16 1726  AMMONIA 36*   Coagulation Profile: No results for input(s): INR, PROTIME in the last 168 hours. Cardiac Enzymes:  Recent Labs Lab 12/28/16 1335 12/28/16 1850 12/29/16 0023 12/29/16 0548 12/30/16 0327  CKTOTAL 720*  --   --   --  3,101*  TROPONINI  --  0.03* 0.06* 0.07*  --    BNP (last 3 results) No results for input(s): PROBNP in the last 8760 hours. HbA1C: No results for input(s): HGBA1C in the last 72 hours. CBG: No results for input(s): GLUCAP in the last 168 hours. Lipid Profile: No results for input(s): CHOL, HDL, LDLCALC, TRIG, CHOLHDL, LDLDIRECT in the last 72 hours. Thyroid Function Tests: No results for input(s): TSH, T4TOTAL, FREET4, T3FREE, THYROIDAB in the last 72 hours. Anemia Panel: No results for input(s): VITAMINB12, FOLATE, FERRITIN, TIBC, IRON, RETICCTPCT in the last 72 hours. Sepsis Labs:  Recent Labs Lab 12/28/16 1341 12/28/16 1726 12/29/16 0548  PROCALCITON  --  0.10  --   LATICACIDVEN 6.45* 3.2* 1.3    Recent Results (from the past 240 hour(s))  Blood Culture (routine x 2)     Status: None (Preliminary result)   Collection Time: 12/28/16  2:02 PM  Result Value Ref Range Status   Specimen Description LEFT ANTECUBITAL  Final   Special Requests   Final    BOTTLES DRAWN AEROBIC AND ANAEROBIC Blood Culture results may not be optimal due to an inadequate volume of blood received in culture bottles   Culture NO GROWTH 1 DAY  Final   Report Status PENDING  Incomplete  Blood Culture (routine x 2)      Status: None (Preliminary result)   Collection Time: 12/28/16  2:47 PM  Result Value Ref Range Status   Specimen Description BLOOD LEFT FOREARM  Final   Special Requests   Final    BOTTLES DRAWN AEROBIC AND ANAEROBIC Blood Culture adequate volume   Culture NO GROWTH 1 DAY  Final   Report Status PENDING  Incomplete         Radiology Studies: Ct Head Wo Contrast  Result Date: 12/28/2016 CLINICAL DATA:  Found on the floor. Not following commands. Confusion. EXAM: CT HEAD WITHOUT CONTRAST CT CERVICAL SPINE WITHOUT CONTRAST TECHNIQUE: Multidetector CT imaging of the  head and cervical spine was performed following the standard protocol without intravenous contrast. Multiplanar CT image reconstructions of the cervical spine were also generated. COMPARISON:  06/05/2016.  03/27/2016. FINDINGS: CT HEAD FINDINGS Brain: Mild age related atrophy. No evidence of old or acute focal infarction, mass lesion, hemorrhage, hydrocephalus or extra-axial collection. Vascular: There is atherosclerotic calcification of the major vessels at the base of the brain. Skull: Normal Sinuses/Orbits: Clear. Orbits normal except for old lamina papyracea fracture on the right. Other: None CT CERVICAL SPINE FINDINGS Alignment: Straightening of the normal cervical lordosis. 3 mm anterolisthesis C7-T1 which is chronic. Skull base and vertebrae: Chronic posterior element fusion at C2-3. Prominent anterior osteophytes which could be associated with dysphagia. Soft tissues and spinal canal: Otherwise negative Disc levels: Degenerative spondylosis C3-4 through C7-T1. Facet arthropathy bilaterally at C7-T1 and on the right at see C3-4 and C4-5. Mild bony foraminal narrowing because of these processes. Upper chest: Negative Other: None IMPRESSION: Head CT:  No acute or traumatic finding.  Normal study for age. Cervical spine CT: No acute or traumatic finding. Extensive chronic degenerative changes as outlined above. Electronically Signed   By:  Nelson Chimes M.D.   On: 12/28/2016 15:31   Ct Cervical Spine Wo Contrast  Result Date: 12/28/2016 CLINICAL DATA:  Found on the floor. Not following commands. Confusion. EXAM: CT HEAD WITHOUT CONTRAST CT CERVICAL SPINE WITHOUT CONTRAST TECHNIQUE: Multidetector CT imaging of the head and cervical spine was performed following the standard protocol without intravenous contrast. Multiplanar CT image reconstructions of the cervical spine were also generated. COMPARISON:  06/05/2016.  03/27/2016. FINDINGS: CT HEAD FINDINGS Brain: Mild age related atrophy. No evidence of old or acute focal infarction, mass lesion, hemorrhage, hydrocephalus or extra-axial collection. Vascular: There is atherosclerotic calcification of the major vessels at the base of the brain. Skull: Normal Sinuses/Orbits: Clear. Orbits normal except for old lamina papyracea fracture on the right. Other: None CT CERVICAL SPINE FINDINGS Alignment: Straightening of the normal cervical lordosis. 3 mm anterolisthesis C7-T1 which is chronic. Skull base and vertebrae: Chronic posterior element fusion at C2-3. Prominent anterior osteophytes which could be associated with dysphagia. Soft tissues and spinal canal: Otherwise negative Disc levels: Degenerative spondylosis C3-4 through C7-T1. Facet arthropathy bilaterally at C7-T1 and on the right at see C3-4 and C4-5. Mild bony foraminal narrowing because of these processes. Upper chest: Negative Other: None IMPRESSION: Head CT:  No acute or traumatic finding.  Normal study for age. Cervical spine CT: No acute or traumatic finding. Extensive chronic degenerative changes as outlined above. Electronically Signed   By: Nelson Chimes M.D.   On: 12/28/2016 15:31   Dg Chest Portable 1 View  Result Date: 12/28/2016 CLINICAL DATA:  Altered mental status. EXAM: PORTABLE CHEST 1 VIEW COMPARISON:  06/05/2016 FINDINGS: Cardiac silhouette is normal size. No mediastinal or hilar masses. No convincing adenopathy. Left  anterior chest wall sequential pacemaker is stable. Clear lungs.  No pleural effusion or pneumothorax. Skeletal structures are grossly intact. IMPRESSION: No active disease. Electronically Signed   By: Lajean Manes M.D.   On: 12/28/2016 13:58        Scheduled Meds: . aspirin  325 mg Oral Once   Or  . aspirin  300 mg Rectal Once  . enoxaparin (LOVENOX) injection  40 mg Subcutaneous Q24H  . feeding supplement (ENSURE ENLIVE)  237 mL Oral BID BM  . flecainide  75 mg Oral BID  . metoprolol succinate  25 mg Oral Daily  . tamsulosin  0.4 mg Oral Daily  . [START ON 01/02/2017] Vitamin D (Ergocalciferol)  50,000 Units Oral Q7 days   Continuous Infusions: . sodium chloride 125 mL/hr at 12/30/16 0816     LOS: 1 day        Aline August, MD [facility] Pager [phone]  If 7PM-7AM, please contact night-coverage [email or web address] Password [email or web address] 12/30/2016, 11:23 AM

## 2016-12-30 NOTE — Progress Notes (Signed)
*  PRELIMINARY RESULTS* Vascular Ultrasound Carotid Duplex (Doppler) has been completed.  Preliminary findings: Bilateral: No significant (1-39%) ICA stenosis. Antegrade vertebral flow.   Landry Mellow, RDMS, RVT  12/30/2016, 9:17 AM

## 2016-12-30 NOTE — Progress Notes (Signed)
  Echocardiogram 2D Echocardiogram has been performed.  Jeronimo Hellberg 12/30/2016, 11:40 AM

## 2016-12-30 NOTE — Progress Notes (Signed)
Pt brought down to MRI for scan of brain.  Pt has a [employer] pacemaker and even has MR conditional leads, but the ADDRO1 that the patient has installed is NOT MRI conditional.  Pt is unable to be scanned and pt was sent back up to his room by transport.

## 2016-12-30 NOTE — Consult Note (Signed)
[facility] CM Primary Care Navigator  12/30/2016  Jonathon [name] 09/27/1935 [id number]   Met withpatient, sister Jonathon [name]) and daughter at the bedside to identify possibledischarge needs. Patient reports having a fall and  "passed out" that hadled to this admission.  Patient endorses Dr. Bluford Kaufmann with [facility] as [place] care provider.   [facility] [address] [facility].  Patient states that he ismanaginghismedications at home straight out of the containers but plans to use "pill box" system after discharge.  Patient reports driving prior to admission. Patient declined [facility] transportation resource list offered. He mentioned using [employer] bus fortransportation to his doctors' appointments.  Patient lives alone. He understands that he may be needing more help with his care needs after discharge. Patient's niece Jonathon [name]) will beprovidingassistance with care needs at home in between work schedule as stated. [facility] list of personal care services provided as requested by sister for back-up resource, with the understanding that it will be paid out of the pocket.Patient and family were grateful of the resource list provided.  Discharge plan isundetermined yet, pending therapy evaluation/ recommendation and physician order. Patient hopes to be discharged home.  Patient and daughter voiced understanding to call primary care provider's office when he returns backhome, for a post discharge follow-up appointment within a week or sooner if needs arise.Patient letter (with PCP's contact number) wasprovided as theirreminder.  Explained to patient and daughter regarding[facility] CM services available for further healthmanagement at home. Patient opted and verbally agreed to [facility] to follow him up at home as he recovers. Referral to [facility]  made.  Patient and daughter expressed understanding to seekreferral to [facility] care managementservicesfrom primary care provider (on follow-up visit) if deemed necessary and appropriate for services.   [facility] care management information provided for future needs that may arise.   For questions, please contact:  Jonathon [name], BSN, RN- [facility] Primary Care Navigator  Telephone: [phone] [place]

## 2016-12-30 NOTE — Progress Notes (Signed)
EEG Completed; Results Pending  

## 2016-12-31 DIAGNOSIS — E876 Hypokalemia: Secondary | ICD-10-CM

## 2016-12-31 DIAGNOSIS — M6282 Rhabdomyolysis: Secondary | ICD-10-CM

## 2016-12-31 LAB — CBC WITH DIFFERENTIAL/PLATELET
Basophils Absolute: 0 10*3/uL (ref 0.0–0.1)
Basophils Relative: 0 %
EOS ABS: 0 10*3/uL (ref 0.0–0.7)
Eosinophils Relative: 1 %
HEMATOCRIT: 33.2 % — AB (ref 39.0–52.0)
HEMOGLOBIN: 11 g/dL — AB (ref 13.0–17.0)
LYMPHS ABS: 1.7 10*3/uL (ref 0.7–4.0)
LYMPHS PCT: 37 %
MCH: 28.6 pg (ref 26.0–34.0)
MCHC: 33.1 g/dL (ref 30.0–36.0)
MCV: 86.5 fL (ref 78.0–100.0)
MONOS PCT: 13 %
Monocytes Absolute: 0.6 10*3/uL (ref 0.1–1.0)
NEUTROS ABS: 2.2 10*3/uL (ref 1.7–7.7)
NEUTROS PCT: 49 %
Platelets: 150 10*3/uL (ref 150–400)
RBC: 3.84 MIL/uL — AB (ref 4.22–5.81)
RDW: 15 % (ref 11.5–15.5)
WBC: 4.5 10*3/uL (ref 4.0–10.5)

## 2016-12-31 LAB — BASIC METABOLIC PANEL
ANION GAP: 6 (ref 5–15)
BUN: 6 mg/dL (ref 6–20)
CHLORIDE: 106 mmol/L (ref 101–111)
CO2: 25 mmol/L (ref 22–32)
Calcium: 8.2 mg/dL — ABNORMAL LOW (ref 8.9–10.3)
Creatinine, Ser: 0.87 mg/dL (ref 0.61–1.24)
GFR calc Af Amer: >60 mL/min (ref >60)
GFR calc non Af Amer: >60 mL/min (ref >60)
GLUCOSE: 92 mg/dL (ref 65–99)
POTASSIUM: 3 mmol/L — AB (ref 3.5–5.1)
Sodium: 137 mmol/L (ref 135–145)

## 2016-12-31 LAB — MAGNESIUM: Magnesium: 1.6 mg/dL — ABNORMAL LOW (ref 1.7–2.4)

## 2016-12-31 LAB — CK: Total CK: 6432 U/L — ABNORMAL HIGH (ref 49–397)

## 2016-12-31 MED ORDER — MAGNESIUM SULFATE 2 GM/50ML IV SOLN
2.0000 g | Freq: Once | INTRAVENOUS | Status: AC
  Administered 2016-12-31: 2 g via INTRAVENOUS
  Filled 2016-12-31: qty 50

## 2016-12-31 MED ORDER — LABETALOL HCL 5 MG/ML IV SOLN
10.0000 mg | INTRAVENOUS | Status: DC | PRN
  Administered 2016-12-31: 10 mg via INTRAVENOUS
  Filled 2016-12-31 (×2): qty 4

## 2016-12-31 MED ORDER — SODIUM CHLORIDE 0.9 % IV SOLN
INTRAVENOUS | Status: DC
  Administered 2016-12-31 – 2017-01-02 (×5): via INTRAVENOUS

## 2016-12-31 MED ORDER — IRBESARTAN 300 MG PO TABS
300.0000 mg | ORAL_TABLET | Freq: Every day | ORAL | Status: DC
  Administered 2016-12-31 – 2017-01-02 (×3): 300 mg via ORAL
  Filled 2016-12-31 (×3): qty 1

## 2016-12-31 MED ORDER — POTASSIUM CHLORIDE CRYS ER 20 MEQ PO TBCR
80.0000 meq | EXTENDED_RELEASE_TABLET | Freq: Once | ORAL | Status: AC
  Administered 2016-12-31: 80 meq via ORAL
  Filled 2016-12-31: qty 4

## 2016-12-31 MED ORDER — LORAZEPAM 0.5 MG PO TABS
0.5000 mg | ORAL_TABLET | Freq: Four times a day (QID) | ORAL | Status: DC | PRN
  Administered 2016-12-31: 0.5 mg via ORAL
  Administered 2016-12-31: 1 mg via ORAL
  Filled 2016-12-31: qty 2
  Filled 2016-12-31: qty 1

## 2016-12-31 NOTE — Consult Note (Addendum)
   [facility] CM Inpatient Consult   12/31/2016  SIXTO BOWDISH 1936/03/19 [id number]      Went to bedside to follow up on [place] program referral and the need for [facility] Care Management consent in case there are transportation or pharmacy needs.  Went to bedside. Advised by patient's sister to contact Mr. Vick daughter, [name] at [phone].  Called [name] (daughter). She states she is unsure of discharge plan at this time. States "he may some rehab before coming home. I want him to fully be able to maneuver steps before coming home". [name] states she is the main contact for decisions regarding her father.  Made [name] aware that writer will follow up at later time if Mr. Eckardt goes home with [place] aware of conversation with daughter [name].  Spoke with Edwina with [facility] to make aware of above.   [name], MSN-Ed, RN,BSN [facility] Liaison [phone]

## 2016-12-31 NOTE — Progress Notes (Signed)
Patient BP after Hydralazine 10 mg is 192/98. Patient states that he is scared and keeps trying to get out of bed.  Patient is only oriented to self at this time and is worried about "thugs knowing that the house is for sale."  On-call MD, Opyd notified and order for labetaolo 10 mg and ativan 0.5-1 mg ordered.  Will follow MD instructions and continue to monitor patient.

## 2016-12-31 NOTE — Progress Notes (Signed)
Patient ID: Jonathon [name], male   DOB: 1935-11-02, 81 y.o.   MRN: [id number]  PROGRESS NOTE    Jonathon [name]  BPZ:[id number] DOB: 09-Jan-1936 DOA: 12/28/2016 PCP: Marletta Lor, MD   Brief Narrative:  81 year old male with history of BPH, chronic diastolic heart failure, CIDP, hypertension, iron deficiency anemia, SVT status post pacemaker presented with altered mental status and confusion. He was found to have elevated lactic acid with unremarkable initial CT scan of the head. Neurology has been consulted and empiric antibiotics were started. Antibiotics were discontinued on 12/29/2016   Assessment & Plan:   Principal Problem:   Acute encephalopathy Active Problems:   PACEMAKER, PERMANENT   Polyradiculoneuropathy (HCC)   Syncope   Fall   Lactic acidosis   Acute metabolic encephalopathy -Questionable cause. - Apparently was very confused last night. Mental status improved this morning -  MRI could not be done because of pacemaker.  - Neurology has signed off. EEG showed Delta waves but no epileptiform activity. Neurology recommends against intitiation of AED at this time and outpatient neurology follow up for possible ambulatory EEG - PT recommends SNF placement: Social worker consult - Unclear if the patient had syncopal episode as well. carotid ultrasound was negative for significant stenosis. Echo showed EF of 55-60%. Fall precautions. - Follow diet as per SLP recommendations - PT/OT eval  Rhabdomyolysis with elevated CK level -Likely due to fall and on the floor - CK today is 6432. ContinueIV fluids at 125 ML per hour - CK in a.m.  Lactic acidosis - Improved. Probably secondary to fall and dehydration. No signs of infection. Monitoring of antibiotics  History of SVT status post PACEMAKER, PERMANENT - Continue flecainide, Toprol-XL  HTN - BP on the higher side. Continue Toprol. Restart Irbesartan. Monitor BP.  Mildly elevated troponin Probably secondary to  elevated CK and fall with lactic acidosis. No chest pain.  History of CIDP - Outpatient Neurology follow up  Hypokalemia - Replace. Repeat a.m. Labs  Hypomagnesemia - Replace and repeat a.m. labs  Fall -Fall precautions. PT evaluation   BPH - Continue Flomax  DVT prophylaxis: lovenox  CODE STATUS: full code  Consultants: neurology Family Communication:None at bedside Disposition Plan: probable discharge in 1-2 days  Procedures: 2-D echo and carotid ultrasound pending EEG on 12/30/16: Description: The patient is awake and drowsy during the recording.  During maximal wakefulness, there is a symmetric, medium voltage 8.5 Hz posterior dominant rhythm that attenuates with eye opening.  There is near-continuous focal delta slowing over the left hemisphere, at times occurring in a periodic pattern without evolution in frequency or amplitude. Sleep is not captured. Hyperventilation and photic stimulation were not performed.  There were no epileptiform discharges or electrographic seizures seen.    EKG lead was unremarkable.  Impression: This awake and asleep EEG is abnormal due to near-continuous focal slowing over the left hemisphere.  Clinical Correlation of the above findings indicates focal cerebral dysfunction over the left hemisphere suggestive of underlying structural or physiologic abnormality. The absence of epileptiform discharges does not exclude a clinical diagnosis of epilepsy. Clinical correlation is advised.  Carotid US on 12/30/16: Summary:  - The vertebral arteries appear patent with antegrade flow. - Findings consistent with 1- 39 percent stenosis involving the   right internal carotid artery and the left internal carotid   artery.  Echo on 12/30/16: Study Conclusions  - Left ventricle: The cavity size was normal. Wall thickness was   increased in a pattern of mild  LVH. Systolic function was normal.   The estimated ejection fraction  was in the range of 55% to 60%.   Wall motion was normal; there were no regional wall motion   abnormalities. Doppler parameters are consistent with abnormal   left ventricular relaxation (grade 1 diastolic dysfunction). - Aortic valve: There was trivial regurgitation. - Mitral valve: There was mild regurgitation.  Impressions:  - Normal LV systolic function; mild diastolic dysfunction; mild   LVH; trace AI; mild MR.   Antimicrobials: Vancomycin and Zosyn from 12/28/2016- 12/29/2016  Subjective: Patient seen and examined at bedside. He feels better but still slightly confused. No overnight fever or vomiting. He was very confused last night as per nursing report.  Objective: Vitals:   12/31/16 0125 12/31/16 0259 12/31/16 0550 12/31/16 0627  BP: (!) 192/98 (!) 177/88 (!) 172/90 (!) 164/86  Pulse: 83 71 73 78  Resp:   18   Temp:   98.2 F (36.8 C)   TempSrc:      SpO2: 100%  100% 100%    Intake/Output Summary (Last 24 hours) at 12/31/16 1212 Last data filed at 12/31/16 1033  Gross per 24 hour  Intake             1500 ml  Output             2050 ml  Net             -550 ml   There were no vitals filed for this visit.  Examination:  General exam: Appears calm and comfortable but still confused to year Respiratory system: Bilateral decreased breath sound at bases Cardiovascular system: S1 & S2 heard, rate controlled Gastrointestinal system: Abdomen is nondistended, soft and nontender. Normal bowel sounds heard. Central nervous system: No focal neurological deficits. Moving extremities Extremities: No cyanosis, clubbing, edema    Data Reviewed: I have personally reviewed following labs and imaging studies  CBC:  Recent Labs Lab 12/28/16 1335 12/28/16 1850 12/29/16 0548 12/30/16 0327 12/31/16 0526  WBC 8.9 7.9 5.3 5.1 4.5  NEUTROABS 7.3  --   --  2.1 2.2  HGB 12.1* 11.3* 10.5* 10.3* 11.0*  HCT 36.4* 34.5* 31.6* 31.2* 33.2*  MCV 86.9 87.6 87.1 86.9 86.5    PLT 172 168 151 151 536   Basic Metabolic Panel:  Recent Labs Lab 12/28/16 1335 12/28/16 1850 12/29/16 0548 12/30/16 0327 12/31/16 0526  NA 138  --  137 138 137  K 3.7  --  3.3* 3.7 3.0*  CL 104  --  105 107 106  CO2 23  --  23 27 25   GLUCOSE 89  --  105* 94 92  BUN 11  --  5* 10 6  CREATININE 1.22 1.05 0.90 0.96 0.87  CALCIUM 9.1  --  8.1* 8.3* 8.2*  MG  --   --   --  1.5* 1.6*   GFR: CrCl cannot be calculated (Unknown ideal weight.). Liver Function Tests:  Recent Labs Lab 12/28/16 1335 12/30/16 0327  AST 97* 108*  ALT 48 36  ALKPHOS 72 47  BILITOT 1.1 1.1  PROT 8.5* 7.0  ALBUMIN 3.2* 2.5*   No results for input(s): LIPASE, AMYLASE in the last 168 hours.  Recent Labs Lab 12/28/16 1726  AMMONIA 36*   Coagulation Profile: No results for input(s): INR, PROTIME in the last 168 hours. Cardiac Enzymes:  Recent Labs Lab 12/28/16 1335 12/28/16 1850 12/29/16 0023 12/29/16 0548 12/30/16 0327 12/31/16 0840  CKTOTAL 720*  --   --   --  3,101* 6,432*  TROPONINI  --  0.03* 0.06* 0.07*  --   --    BNP (last 3 results) No results for input(s): PROBNP in the last 8760 hours. HbA1C: No results for input(s): HGBA1C in the last 72 hours. CBG: No results for input(s): GLUCAP in the last 168 hours. Lipid Profile: No results for input(s): CHOL, HDL, LDLCALC, TRIG, CHOLHDL, LDLDIRECT in the last 72 hours. Thyroid Function Tests: No results for input(s): TSH, T4TOTAL, FREET4, T3FREE, THYROIDAB in the last 72 hours. Anemia Panel: No results for input(s): VITAMINB12, FOLATE, FERRITIN, TIBC, IRON, RETICCTPCT in the last 72 hours. Sepsis Labs:  Recent Labs Lab 12/28/16 1341 12/28/16 1726 12/29/16 0548  PROCALCITON  --  0.10  --   LATICACIDVEN 6.45* 3.2* 1.3    Recent Results (from the past 240 hour(s))  Blood Culture (routine x 2)     Status: None (Preliminary result)   Collection Time: 12/28/16  2:02 PM  Result Value Ref Range Status   Specimen Description  LEFT ANTECUBITAL  Final   Special Requests   Final    BOTTLES DRAWN AEROBIC AND ANAEROBIC Blood Culture results may not be optimal due to an inadequate volume of blood received in culture bottles   Culture NO GROWTH 3 DAYS  Final   Report Status PENDING  Incomplete  Blood Culture (routine x 2)     Status: None (Preliminary result)   Collection Time: 12/28/16  2:47 PM  Result Value Ref Range Status   Specimen Description BLOOD LEFT FOREARM  Final   Special Requests   Final    BOTTLES DRAWN AEROBIC AND ANAEROBIC Blood Culture adequate volume   Culture NO GROWTH 3 DAYS  Final   Report Status PENDING  Incomplete         Radiology Studies: No results found.      Scheduled Meds: . aspirin  325 mg Oral Once   Or  . aspirin  300 mg Rectal Once  . enoxaparin (LOVENOX) injection  40 mg Subcutaneous Q24H  . feeding supplement (ENSURE ENLIVE)  237 mL Oral BID BM  . flecainide  75 mg Oral BID  . irbesartan  300 mg Oral Daily  . metoprolol succinate  25 mg Oral Daily  . tamsulosin  0.4 mg Oral Daily  . [START ON 01/02/2017] Vitamin D (Ergocalciferol)  50,000 Units Oral Q7 days   Continuous Infusions:   LOS: 2 days        Aline August, MD [facility] Pager [phone]  If 7PM-7AM, please contact night-coverage [email or web address] Password [email or web address] 12/31/2016, 12:12 PM

## 2017-01-01 LAB — CBC WITH DIFFERENTIAL/PLATELET
Basophils Absolute: 0 10*3/uL (ref 0.0–0.1)
Basophils Relative: 0 %
EOS PCT: 1 %
Eosinophils Absolute: 0 10*3/uL (ref 0.0–0.7)
HCT: 33.2 % — ABNORMAL LOW (ref 39.0–52.0)
Hemoglobin: 10.9 g/dL — ABNORMAL LOW (ref 13.0–17.0)
LYMPHS ABS: 1.5 10*3/uL (ref 0.7–4.0)
LYMPHS PCT: 28 %
MCH: 28.4 pg (ref 26.0–34.0)
MCHC: 32.8 g/dL (ref 30.0–36.0)
MCV: 86.5 fL (ref 78.0–100.0)
MONO ABS: 1 10*3/uL (ref 0.1–1.0)
MONOS PCT: 19 %
Neutro Abs: 2.7 10*3/uL (ref 1.7–7.7)
Neutrophils Relative %: 52 %
PLATELETS: 158 10*3/uL (ref 150–400)
RBC: 3.84 MIL/uL — AB (ref 4.22–5.81)
RDW: 14.9 % (ref 11.5–15.5)
WBC: 5.2 10*3/uL (ref 4.0–10.5)

## 2017-01-01 LAB — BASIC METABOLIC PANEL
ANION GAP: 6 (ref 5–15)
BUN: 5 mg/dL — ABNORMAL LOW (ref 6–20)
CALCIUM: 8.5 mg/dL — AB (ref 8.9–10.3)
CO2: 23 mmol/L (ref 22–32)
Chloride: 107 mmol/L (ref 101–111)
Creatinine, Ser: 0.83 mg/dL (ref 0.61–1.24)
GFR calc non Af Amer: >60 mL/min (ref >60)
Glucose, Bld: 91 mg/dL (ref 65–99)
Potassium: 4 mmol/L (ref 3.5–5.1)
Sodium: 136 mmol/L (ref 135–145)

## 2017-01-01 LAB — CK: Total CK: 3777 U/L — ABNORMAL HIGH (ref 49–397)

## 2017-01-01 LAB — MAGNESIUM: MAGNESIUM: 1.7 mg/dL (ref 1.7–2.4)

## 2017-01-01 MED ORDER — MAGNESIUM SULFATE 2 GM/50ML IV SOLN
2.0000 g | Freq: Once | INTRAVENOUS | Status: AC
  Administered 2017-01-01: 2 g via INTRAVENOUS
  Filled 2017-01-01: qty 50

## 2017-01-01 NOTE — Progress Notes (Signed)
Physical Therapy Treatment Patient Details Name: Jonathon [name] MRN: [id number] DOB: 28-Nov-1935 Today's Date: 01/01/2017    History of Present Illness pt is a 81 y/o male with pmh significant for BPH, d CHF, CIDP, HTN, iron deficiency anemia, pacer, brought in to ED by family who found pt down in the floor confused.    PT Comments    Progressing steadily with gait tolerance and stability with cane, etc.  Mentation has improved with pt following simple direction and simple conversation.   Follow Up Recommendations  SNF;Supervision/Assistance - 24 hour     Equipment Recommendations  None recommended by PT    Recommendations for Other Services       Precautions / Restrictions Precautions Precautions: Fall Restrictions Weight Bearing Restrictions: No    Mobility  Bed Mobility               General bed mobility comments: pt in recliner on arrival and back to chair at end of session  Transfers Overall transfer level: Needs assistance Equipment used: Straight cane Transfers: Sit to/from Stand Sit to Stand: Min guard         General transfer comment: pt incoorporated cane well in safe transfer technique  Ambulation/Gait Ambulation/Gait assistance: Min guard;Min assist Ambulation Distance (Feet): 250 Feet Assistive device: Straight cane Gait Pattern/deviations: Step-through pattern   Gait velocity interpretation: Below normal speed for age/gender General Gait Details: Again mildly unsteady overall using std cane.  Cane used safely, but it was not enough to keep pt fully steady when scanning or changing direction .   Stairs            Wheelchair Mobility    Modified Rankin (Stroke Patients Only)       Balance Overall balance assessment: Needs assistance Sitting-balance support: No upper extremity supported Sitting balance-Leahy Scale: Good     Standing balance support: No upper extremity supported;Single extremity supported Standing balance-Leahy  Scale: Fair Standing balance comment: Pt leaning against sink for support during hand washing                            Cognition Arousal/Alertness: Awake/alert Behavior During Therapy: WFL for tasks assessed/performed;Flat affect Overall Cognitive Status: No family/caregiver present to determine baseline cognitive functioning Area of Impairment: Problem solving;Following commands                       Following Commands: Follows one step commands consistently   Awareness: Intellectual Problem Solving: Difficulty sequencing;Requires verbal cues General Comments: Pt had trouble problem solving in unfamiliar environment managing IV pole, Pt demonstrating confusion and needing verbal cues for safety throughout session      Exercises      General Comments General comments (skin integrity, edema, etc.): Pt very very pleasant and excited to work with therapy      Pertinent Vitals/Pain Pain Assessment: No/denies pain Pain Score: 0-No pain Pain Intervention(s): Monitored during session    [place] expects to be discharged to:: Private residence Living Arrangements: Alone Available Help at Discharge: Family;Available 24 hours/day (Family might be able to do ST 24 hours, but not days) Type of Home: House Home Access: Stairs to enter Entrance Stairs-Rails: Left;Right (back door) Home Layout: One level Home Equipment: [place] - 4 wheels;Cane - single point;Tub bench;Hand held shower head      Prior Function Level of Independence: Independent with assistive device(s)      Comments: RW for mobility.  Daughter drives or pt uses SCAT to grocery store. Pt independent with BADL, recently has only been sponge bathing.   PT Goals (current goals can now be found in the care plan section) Acute Rehab PT Goals Patient Stated Goal: To get back to his gardening PT Goal Formulation: Patient unable to participate in goal setting Time For Goal Achievement:  01/13/17 Potential to Achieve Goals: Good Progress towards PT goals: Progressing toward goals    Frequency    Min 3X/week      PT Plan Current plan remains appropriate    Co-evaluation              AM-PAC PT "6 Clicks" Daily Activity  Outcome Measure  Difficulty turning over in bed (including adjusting bedclothes, sheets and blankets)?: A Little Difficulty moving from lying on back to sitting on the side of the bed? : A Little Difficulty sitting down on and standing up from a chair with arms (e.g., wheelchair, bedside commode, etc,.)?: A Little Help needed moving to and from a bed to chair (including a wheelchair)?: A Little Help needed walking in hospital room?: A Little Help needed climbing 3-5 steps with a railing? : A Little 6 Click Score: 18    End of Session   Activity Tolerance: Patient tolerated treatment well Patient left: in chair;with call bell/phone within reach;with chair alarm set Nurse Communication: Mobility status PT Visit Diagnosis: Unsteadiness on feet (R26.81);Other abnormalities of gait and mobility (R26.89)     Time: 8101-7510 PT Time Calculation (min) (ACUTE ONLY): 20 min  Charges:  $Gait Training: 8-22 mins                    G Codes:       January 18, 2017  Donnella Sham, PT [phone]  (pager)   Tessie Fass [place] Scheiderer 01/18/17, 3:47 PM

## 2017-01-01 NOTE — Plan of Care (Signed)
Problem: Safety: Goal: Ability to remain free from injury will improve Outcome: Not Progressing High risk for fall, bed alarm and tele sitter on, patient is very confused and attempting to get OOB multiple times  Problem: Skin Integrity: Goal: Risk for impaired skin integrity will decrease Outcome: Progressing No skin issues noted  Problem: Tissue Perfusion: Goal: Risk factors for ineffective tissue perfusion will decrease Outcome: Progressing No S/S of DVT noted  Problem: Activity: Goal: Risk for activity intolerance will decrease Outcome: Progressing OOB and tolerates well  Problem: Bowel/Gastric: Goal: Will not experience complications related to bowel motility Outcome: Progressing No bowel issues noted, had a bowel movement this shift

## 2017-01-01 NOTE — Progress Notes (Signed)
PROGRESS NOTE    Jonathon [name]  LNL:[id number] DOB: 1936/05/01 DOA: 12/28/2016 PCP: Marletta Lor, MD   Chief Complaint  Patient presents with  . Altered Mental Status    Brief Narrative:  HPI On 12/28/2016 by Dr. Loraine Leriche Patient is a 81 year old male with history of BPH, chronic diastolic CHF, CIDP, hypertension, iron deficiency anemia, SVT status post pacemaker, lives alone and was brought by EMS. He was found lying in the kitchen floor by family this morning confused. At the time of my examination, patient is confused, only oriented to himself. No family member is present at the bedside. Per EDP, Dr Regenia Skeeter, patient was seen last normal last night by his daughter. His daughter tried to contact him today and was unsuccessful. Subsequently family found patient lying on the floor, EMS was called. Unclear if patient had a syncopal episode or any seizure. He lives at home alone. Unable to obtain any other history from the patient himself.  Assessment & Plan   Acute metabolic encephalopathy -Unknown etiology -Mental status appears to improve this morning -Unable to do MRI as patient has pacemaker in place -Neurology consulted and appreciated and has signed off. Neurology recommended no EEGs at this time. Follow-up with outpatient neurology and possibly repeat EEG -EEG should deltoid is no deformity activity. -Carotid ultrasound negative for significant stenosis -Echocardiogram showed EF of 40-81%, grade 1 diastolic dysfunction -PT/OT recommended SNF  Acute rhabdomyolysis with elevated CK level -CK peaked at 6432, was placed on IV fluids -CK currently 3777 -Continue IV fluids, and monitor CK level  Lactic acidosis -Likely secondary to fall and dehydration -Currently no signs of infection  History of SVT -Status post pacemaker -Continue flecainide, metoprolol  Essential hypertension -Continue metoprolol and irbesartan  Mildly elevated troponin -Suspect secondary to the  above, with elevated CK levels -No complaints of chest pain -Echocardiogram as above  History of CIDP -Continue outpatient neurology follow-up  Hypokalemia -resolved   Hypomagnesemia -Magnesium 1.7, will replace  BPH -Continue Flomax  DVT Prophylaxis  lovenox  Code Status: Full  Family Communication: none at bedside  Disposition Plan: Admitted. Pending improvement in CK levels. Needs SNF  Consultants Neurology  Procedures  EEG Echocardiogram Carotid Doppler  Antibiotics   Anti-infectives    Start     Dose/Rate Route Frequency Ordered Stop   12/29/16 0400  vancomycin (VANCOCIN) IVPB 750 mg/150 ml premix  Status:  Discontinued     750 mg 150 mL/hr over 60 Minutes Intravenous Every 12 hours 12/28/16 1412 12/29/16 1408   12/28/16 2200  piperacillin-tazobactam (ZOSYN) IVPB 3.375 g  Status:  Discontinued     3.375 g 12.5 mL/hr over 240 Minutes Intravenous Every 8 hours 12/28/16 1412 12/29/16 1408   12/28/16 1415  piperacillin-tazobactam (ZOSYN) IVPB 3.375 g     3.375 g 100 mL/hr over 30 Minutes Intravenous  Once 12/28/16 1401 12/28/16 1526   12/28/16 1415  vancomycin (VANCOCIN) IVPB 1000 mg/200 mL premix  Status:  Discontinued     1,000 mg 200 mL/hr over 60 Minutes Intravenous  Once 12/28/16 1401 12/28/16 1406   12/28/16 1415  vancomycin (VANCOCIN) 1,500 mg in sodium chloride 0.9 % 500 mL IVPB     1,500 mg 250 mL/hr over 120 Minutes Intravenous  Once 12/28/16 1406 12/28/16 1636      Subjective:   Jonathon [name] seen and examined today.  Patient states he's feeling better. Denies any chest pain, shortness breath, abdominal pain, nausea or vomiting, diarrhea constipation would like to go  home.  Objective:   Vitals:   12/31/16 2111 12/31/16 2330 01/01/17 0516 01/01/17 1450  BP: (!) 197/104 (!) 175/92 (!) 171/94 (!) 161/87  Pulse: 82  81 83  Resp:   18 16  Temp: 98.3 F (36.8 C)  98.8 F (37.1 C) 97.7 F (36.5 C)  TempSrc: Oral  Oral Oral  SpO2: 99%  100% 100%     Intake/Output Summary (Last 24 hours) at 01/01/17 1704 Last data filed at 01/01/17 1501  Gross per 24 hour  Intake           2987.5 ml  Output              800 ml  Net           2187.5 ml   There were no vitals filed for this visit.  Exam  General: Well developed, well nourished, NAD, appears stated age  HEENT: NCAT, mucous membranes moist.   Cardiovascular: S1 S2 auscultated, no rubs, murmurs or gallops. Regular rate and rhythm.  Respiratory: Clear to auscultation bilaterally with equal chest rise  Abdomen: Soft, nontender, nondistended, + bowel sounds  Extremities: warm dry without cyanosis clubbing or edema  Neuro: AAOx3,, Nonfocal  Psych: Appropriate   Data Reviewed: I have personally reviewed following labs and imaging studies  CBC:  Recent Labs Lab 12/28/16 1335 12/28/16 1850 12/29/16 0548 12/30/16 0327 12/31/16 0526 01/01/17 0510  WBC 8.9 7.9 5.3 5.1 4.5 5.2  NEUTROABS 7.3  --   --  2.1 2.2 2.7  HGB 12.1* 11.3* 10.5* 10.3* 11.0* 10.9*  HCT 36.4* 34.5* 31.6* 31.2* 33.2* 33.2*  MCV 86.9 87.6 87.1 86.9 86.5 86.5  PLT 172 168 151 151 150 595   Basic Metabolic Panel:  Recent Labs Lab 12/28/16 1335 12/28/16 1850 12/29/16 0548 12/30/16 0327 12/31/16 0526 01/01/17 0510  NA 138  --  137 138 137 136  K 3.7  --  3.3* 3.7 3.0* 4.0  CL 104  --  105 107 106 107  CO2 23  --  23 27 25 23   GLUCOSE 89  --  105* 94 92 91  BUN 11  --  5* 10 6 5*  CREATININE 1.22 1.05 0.90 0.96 0.87 0.83  CALCIUM 9.1  --  8.1* 8.3* 8.2* 8.5*  MG  --   --   --  1.5* 1.6* 1.7   GFR: CrCl cannot be calculated (Unknown ideal weight.). Liver Function Tests:  Recent Labs Lab 12/28/16 1335 12/30/16 0327  AST 97* 108*  ALT 48 36  ALKPHOS 72 47  BILITOT 1.1 1.1  PROT 8.5* 7.0  ALBUMIN 3.2* 2.5*   No results for input(s): LIPASE, AMYLASE in the last 168 hours.  Recent Labs Lab 12/28/16 1726  AMMONIA 36*   Coagulation Profile: No results for input(s): INR,  PROTIME in the last 168 hours. Cardiac Enzymes:  Recent Labs Lab 12/28/16 1335 12/28/16 1850 12/29/16 0023 12/29/16 0548 12/30/16 0327 12/31/16 0840 01/01/17 0510  CKTOTAL 720*  --   --   --  3,101* 6,432* 3,777*  TROPONINI  --  0.03* 0.06* 0.07*  --   --   --    BNP (last 3 results) No results for input(s): PROBNP in the last 8760 hours. HbA1C: No results for input(s): HGBA1C in the last 72 hours. CBG: No results for input(s): GLUCAP in the last 168 hours. Lipid Profile: No results for input(s): CHOL, HDL, LDLCALC, TRIG, CHOLHDL, LDLDIRECT in the last 72 hours. Thyroid Function Tests: No  results for input(s): TSH, T4TOTAL, FREET4, T3FREE, THYROIDAB in the last 72 hours. Anemia Panel: No results for input(s): VITAMINB12, FOLATE, FERRITIN, TIBC, IRON, RETICCTPCT in the last 72 hours. Urine analysis:    Component Value Date/Time   COLORURINE YELLOW 12/28/2016 1627   APPEARANCEUR CLEAR 12/28/2016 1627   LABSPEC 1.013 12/28/2016 1627   PHURINE 5.0 12/28/2016 1627   GLUCOSEU NEGATIVE 12/28/2016 1627   HGBUR MODERATE (A) 12/28/2016 1627   BILIRUBINUR NEGATIVE 12/28/2016 1627   KETONESUR NEGATIVE 12/28/2016 1627   PROTEINUR 30 (A) 12/28/2016 1627   UROBILINOGEN 1.0 08/10/2014 1653   NITRITE NEGATIVE 12/28/2016 1627   LEUKOCYTESUR NEGATIVE 12/28/2016 1627   Sepsis Labs: @LABRCNTIP (procalcitonin:4,lacticidven:4)  ) Recent Results (from the past 240 hour(s))  Blood Culture (routine x 2)     Status: None (Preliminary result)   Collection Time: 12/28/16  2:02 PM  Result Value Ref Range Status   Specimen Description LEFT ANTECUBITAL  Final   Special Requests   Final    BOTTLES DRAWN AEROBIC AND ANAEROBIC Blood Culture results may not be optimal due to an inadequate volume of blood received in culture bottles   Culture NO GROWTH 4 DAYS  Final   Report Status PENDING  Incomplete  Blood Culture (routine x 2)     Status: None (Preliminary result)   Collection Time: 12/28/16   2:47 PM  Result Value Ref Range Status   Specimen Description BLOOD LEFT FOREARM  Final   Special Requests   Final    BOTTLES DRAWN AEROBIC AND ANAEROBIC Blood Culture adequate volume   Culture NO GROWTH 4 DAYS  Final   Report Status PENDING  Incomplete      Radiology Studies: No results found.   Scheduled Meds: . enoxaparin (LOVENOX) injection  40 mg Subcutaneous Q24H  . feeding supplement (ENSURE ENLIVE)  237 mL Oral BID BM  . flecainide  75 mg Oral BID  . irbesartan  300 mg Oral Daily  . metoprolol succinate  25 mg Oral Daily  . tamsulosin  0.4 mg Oral Daily  . [START ON 01/02/2017] Vitamin D (Ergocalciferol)  50,000 Units Oral Q7 days   Continuous Infusions: . sodium chloride 125 mL/hr at 01/01/17 1619     LOS: 3 days   Time Spent in minutes   30 minutes  Giancarlo Askren D.O. on 01/01/2017 at 5:04 PM  Between 7am to 7pm - Pager - [phone]  After 7pm go to [email or web address] - password [email or web address]  And look for the night coverage person covering for me after hours  [facility] Office  [phone]

## 2017-01-01 NOTE — Evaluation (Signed)
Occupational Therapy Evaluation Patient Details Name: Jonathon [name] MRN: [id number] DOB: [birth year] Today's Date: 01/01/2017    History of Present Illness pt is a 81 y/o male with pmh significant for BPH, d CHF, CIDP, HTN, iron deficiency anemia, pacer, brought in to ED by family who found pt down in the floor confused.   Clinical Impression   PTA Pt independent in ADL and mobility. Pt currently min A for ADL and min A for mobility pushing IV pole and on/off hand held assist. Pt demonstrating more deficits in mentation and cognition than physical at this time. Pt did share that he has not been getting in the bathtub (even with a tub bench) recently and has been sponge bathing, and would like to get back to taking a "real shower". Pt will benefit from skilled OT in the acute setting and afterwards as a part of the "home first" initiative. For safety, OT believes it is imperative that Mr. Berry have 24 hour supervision at this time. Next session to focus on tub transfer and continue to put him in probable instances where he has to problem solve ADLs.     Follow Up Recommendations  Home health OT;Supervision/Assistance - 24 hour (From notes, Pt is going "Home First Program")    Equipment Recommendations  3 in 1 bedside commode    Recommendations for Other Services       Precautions / Restrictions Precautions Precautions: Fall Restrictions Weight Bearing Restrictions: No      Mobility Bed Mobility               General bed mobility comments: Pt sitting OOB in recliner when OT entered the room  Transfers Overall transfer level: Needs assistance Equipment used: None Transfers: Sit to/from Stand Sit to Stand: Min guard;Min assist         General transfer comment: min guard from seats with arm rests, min A from surfaces with no arm rests    Balance Overall balance assessment: Needs assistance Sitting-balance support: No upper extremity supported Sitting balance-Leahy Scale:  Good     Standing balance support: No upper extremity supported;Single extremity supported Standing balance-Leahy Scale: Fair Standing balance comment: Pt leaning against sink for support during hand washing                           ADL either performed or assessed with clinical judgement   ADL Overall ADL's : Needs assistance/impaired Eating/Feeding: Set up;Sitting Eating/Feeding Details (indicate cue type and reason): able to demonstrate ability to use utensils to feed himself  Grooming: Wash/dry hands;Minimal assistance;Standing Grooming Details (indicate cue type and reason): min A for problem solving task - at first tried to stand on the side of the sink, then he could not find the soap, and then he could not find the paper towels. Not sure if he would have the sequencing problems if in his typical environment Upper Body Bathing: Set up;Sitting   Lower Body Bathing: Set up;Sitting/lateral leans   Upper Body Dressing : Supervision/safety   Lower Body Dressing: Minimal assistance   Toilet Transfer: Minimal assistance;Ambulation Toilet Transfer Details (indicate cue type and reason): pushing IV pole with HHA Toileting- Clothing Manipulation and Hygiene: Minimal assistance;Sit to/from stand Toileting - Clothing Manipulation Details (indicate cue type and reason): min A for balance and steadying. No assist needed for rear peri care Tub/ Shower Transfer: Min guard   Functional mobility during ADLs: Minimal assistance (pushing IV pole) General ADL  Comments: deficits seem more mental than physical, Pt eager and willing to participate with therapy     Vision Baseline Vision/History: Wears glasses Wears Glasses: At all times Patient Visual Report: No change from baseline       Perception     Praxis      Pertinent Vitals/Pain Pain Assessment: Faces Pain Score: 0-No pain Pain Intervention(s): Monitored during session     Hand Dominance Right   Extremity/Trunk  Assessment             Communication Communication Communication: No difficulties   Cognition Arousal/Alertness: Awake/alert Behavior During Therapy: WFL for tasks assessed/performed;Flat affect Overall Cognitive Status: No family/caregiver present to determine baseline cognitive functioning Area of Impairment: Problem solving;Following commands                       Following Commands: Follows one step commands consistently   Awareness: Intellectual Problem Solving: Difficulty sequencing;Requires verbal cues General Comments: Pt had trouble problem solving in unfamiliar environment managing IV pole, Pt demonstrating confusion and needing verbal cues for safety throughout session   General Comments  Pt very very pleasant and excited to work with therapy    Exercises     Shoulder Instructions      Home Living Family/patient expects to be discharged to:: Private residence Living Arrangements: Alone Available Help at Discharge: Family;Available 24 hours/day (Family might be able to do ST 24 hours, but not days) Type of Home: House Home Access: Stairs to enter [employer] of Steps: 3-6 Entrance Stairs-Rails: Left;Right (back door) Home Layout: One level     Bathroom Shower/Tub: Tub/shower unit;Curtain   Bathroom Toilet: Handicapped height     Home Equipment: Environmental consultant - 4 wheels;Cane - single point;Tub bench;Hand held shower head          Prior Functioning/Environment Level of Independence: Independent with assistive device(s)        Comments: RW for mobility. Daughter drives or pt uses SCAT to grocery store. Pt independent with BADL, recently has only been sponge bathing.        OT Problem List: Decreased activity tolerance;Impaired balance (sitting and/or standing);Decreased cognition;Decreased knowledge of use of DME or AE;Decreased knowledge of precautions      OT Treatment/Interventions: Self-care/ADL training;Therapeutic exercise;Energy  conservation;DME and/or AE instruction;Therapeutic activities;Cognitive remediation/compensation;Patient/family education;Balance training    OT Goals(Current goals can be found in the care plan section) Acute Rehab OT Goals Patient Stated Goal: To get back to his gardening OT Goal Formulation: With patient Time For Goal Achievement: 01/15/17 Potential to Achieve Goals: Good ADL Goals Pt Will Perform Grooming: with modified independence;standing (sink level) Pt Will Perform Upper Body Bathing: with modified independence;sitting (using tub bench) Pt Will Perform Lower Body Bathing: with modified independence;with adaptive equipment;sit to/from stand (using tub bench) Pt Will Transfer to Toilet: with modified independence;ambulating (BSC over toilet; using SPC/RW) Pt Will Perform Toileting - Clothing Manipulation and hygiene: with modified independence;sit to/from stand Pt Will Perform Tub/Shower Transfer: Tub transfer;with supervision;tub bench  OT Frequency: Min 2X/week   Barriers to D/C: Decreased caregiver support  Pt DOES NOT HAVE 24 hour supervision       Co-evaluation              AM-PAC PT "6 Clicks" Daily Activity     Outcome Measure Help from another person eating meals?: None Help from another person taking care of personal grooming?: A Little Help from another person toileting, which includes using toliet, bedpan, or urinal?:  A Little Help from another person bathing (including washing, rinsing, drying)?: A Little Help from another person to put on and taking off regular upper body clothing?: None Help from another person to put on and taking off regular lower body clothing?: A Little 6 Click Score: 20   End of Session Equipment Utilized During Treatment: Gait belt;Other (comment) (IV pole) Nurse Communication: Mobility status  Activity Tolerance: Patient tolerated treatment well Patient left: in chair;with call bell/phone within reach;with chair alarm set;with  nursing/sitter in room;Other (comment) (telesitter notified via telephone that OT was completed)  OT Visit Diagnosis: Unsteadiness on feet (R26.81);Muscle weakness (generalized) (M62.81);Other symptoms and signs involving cognitive function                Time: 1425-1451 OT Time Calculation (min): 26 min Charges:  OT General Charges $OT Visit: 1 Procedure OT Evaluation $OT Eval Moderate Complexity: 1 Procedure OT Treatments $Self Care/Home Management : 8-22 mins G-Codes:     Hulda Humphrey OTR/L [place] 01/01/2017, 3:41 PM

## 2017-01-01 NOTE — Progress Notes (Signed)
CSW received consult regarding PT recommendation of SNF at discharge.  Patient's family refusing SNF. CSW spoke with patient's daughter, [name]. We discussed the different discharge options available for patient. She reported that she felt patient would get more days with the [facility] program than at SNF since his current issue is more cognitive than physical. CSW alerted [facility] representative who will contact [name] today.  CSW signing off.  Percell Locus Torianna Junio LCSWA [phone]

## 2017-01-01 NOTE — Care Management Important Message (Signed)
Important Message  Patient Details  Name: Jonathon [name] MRN: [id number] Date of Birth: 1935-09-14   Medicare Important Message Given:  Yes    Nathen May 01/01/2017, 9:56 AM

## 2017-01-02 ENCOUNTER — Telehealth: Payer: Self-pay | Admitting: Neurology

## 2017-01-02 ENCOUNTER — Telehealth: Payer: Self-pay | Admitting: Internal Medicine

## 2017-01-02 DIAGNOSIS — N400 Enlarged prostate without lower urinary tract symptoms: Secondary | ICD-10-CM

## 2017-01-02 DIAGNOSIS — R5381 Other malaise: Secondary | ICD-10-CM

## 2017-01-02 LAB — CBC
HCT: 32.5 % — ABNORMAL LOW (ref 39.0–52.0)
HEMOGLOBIN: 11 g/dL — AB (ref 13.0–17.0)
MCH: 29.6 pg (ref 26.0–34.0)
MCHC: 33.8 g/dL (ref 30.0–36.0)
MCV: 87.4 fL (ref 78.0–100.0)
PLATELETS: 169 10*3/uL (ref 150–400)
RBC: 3.72 MIL/uL — ABNORMAL LOW (ref 4.22–5.81)
RDW: 15.1 % (ref 11.5–15.5)
WBC: 6 10*3/uL (ref 4.0–10.5)

## 2017-01-02 LAB — BASIC METABOLIC PANEL
ANION GAP: 9 (ref 5–15)
BUN: 6 mg/dL (ref 6–20)
CHLORIDE: 104 mmol/L (ref 101–111)
CO2: 20 mmol/L — AB (ref 22–32)
Calcium: 8.2 mg/dL — ABNORMAL LOW (ref 8.9–10.3)
Creatinine, Ser: 0.87 mg/dL (ref 0.61–1.24)
GLUCOSE: 87 mg/dL (ref 65–99)
POTASSIUM: 3.5 mmol/L (ref 3.5–5.1)
SODIUM: 133 mmol/L — AB (ref 135–145)

## 2017-01-02 LAB — CULTURE, BLOOD (ROUTINE X 2)
Culture: NO GROWTH
Culture: NO GROWTH
Special Requests: ADEQUATE

## 2017-01-02 LAB — CK: Total CK: 2446 U/L — ABNORMAL HIGH (ref 49–397)

## 2017-01-02 LAB — MAGNESIUM: Magnesium: 1.6 mg/dL — ABNORMAL LOW (ref 1.7–2.4)

## 2017-01-02 MED ORDER — MAGNESIUM SULFATE 4 GM/100ML IV SOLN
4.0000 g | Freq: Once | INTRAVENOUS | Status: AC
  Administered 2017-01-02: 4 g via INTRAVENOUS
  Filled 2017-01-02: qty 100

## 2017-01-02 MED ORDER — POTASSIUM CHLORIDE CRYS ER 20 MEQ PO TBCR
30.0000 meq | EXTENDED_RELEASE_TABLET | ORAL | Status: AC
  Administered 2017-01-02 (×2): 30 meq via ORAL
  Filled 2017-01-02 (×2): qty 1

## 2017-01-02 MED ORDER — MAGNESIUM SULFATE 2 GM/50ML IV SOLN
2.0000 g | Freq: Once | INTRAVENOUS | Status: DC
  Filled 2017-01-02: qty 50

## 2017-01-02 NOTE — Discharge Instructions (Signed)
Confusion Confusion is the inability to think with your usual speed or clarity. Confusion may come on quickly or slowly over time. How quickly the confusion comes on depends on the cause. Confusion can be due to any number of causes. What are the causes?  Concussion, head injury, or head trauma.  Seizures.  Stroke.  Fever.  Brain tumor.  Age related decreased brain function (dementia).  Heightened emotional states like rage or terror.  Mental illness in which the person loses the ability to determine what is real and what is not (hallucinations).  Infections such as a urinary tract infection (UTI).  Toxic effects from alcohol, drugs, or prescription medicines.  Dehydration and an imbalance of salts in the body (electrolytes).  Lack of sleep.  Low blood sugar (diabetes).  Low levels of oxygen from conditions such as chronic lung disorders.  Drug interactions or other medicine side effects.  Nutritional deficiencies, especially niacin, thiamine, vitamin C, or vitamin B.  Sudden drop in body temperature (hypothermia).  Change in routine, such as when traveling or hospitalized. What are the signs or symptoms? People often describe their thinking as cloudy or unclear when they are confused. Confusion can also include feeling disoriented. That means you are unaware of where or who you are. You may also not know what the date or time is. If confused, you may also have difficulty paying attention, remembering, and making decisions. Some people also act aggressively when they are confused. How is this diagnosed? The medical evaluation of confusion may include:  Blood and urine tests.  X-rays.  Brain and nervous system tests.  Analyzing your brain waves (electroencephalogram or EEG).  Magnetic resonance imaging (MRI) of your head.  Computed tomography (CT) scan of your head.  Mental status tests in which your health care provider may ask many questions. Some of these  questions may seem silly or strange, but they are a very important test to help diagnose and treat confusion.  How is this treated? An admission to the hospital may not be needed, but a person with confusion should not be left alone. Stay with a family member or friend until the confusion clears. Avoid alcohol, pain relievers, or sedative drugs until you have fully recovered. Do not drive until directed by your health care provider. Follow these instructions at home: What family and friends can do:  To find out if someone is confused, ask the person to state his or her name, age, and the date. If the person is unsure or answers incorrectly, he or she is confused.  Always introduce yourself, no matter how well the person knows you.  Often remind the person of his or her location.  Place a calendar and clock near the confused person.  Help the person with his or her medicines. You may want to use a pill box, an alarm as a reminder, or give the person each dose as prescribed.  Talk about current events and plans for the day.  Try to keep the environment calm, quiet, and peaceful.  Make sure the person keeps follow-up visits with his or her health care provider.  How is this prevented? Ways to prevent confusion:  Avoid alcohol.  Eat a balanced diet.  Get enough sleep.  Take medicine only as directed by your health care provider.  Do not become isolated. Spend time with other people and make plans for your days.  Keep careful watch on your blood sugar levels if you are diabetic.  Get help right  away if:  You develop severe headaches, repeated vomiting, seizures, blackouts, or slurred speech.  There is increasing confusion, weakness, numbness, restlessness, or personality changes.  You develop a loss of balance, have marked dizziness, feel uncoordinated, or fall.  You have delusions, hallucinations, or develop severe anxiety.  Your family members think you need to be  rechecked. This information is not intended to replace advice given to you by your health care provider. Make sure you discuss any questions you have with your health care provider. Document Released: 06/06/2004 Document Revised: 11/17/2015 Document Reviewed: 06/04/2013 Elsevier Interactive Patient Education  2018 [employer].   Rhabdomyolysis Rhabdomyolysis is a condition that happens when muscle cells break down and release substances into the blood that can damage the kidneys. This condition happens because of damage to the muscles that move bones (skeletal muscle). When the skeletal muscles are damaged, substances inside the muscle cells go into the blood. One of these substances is a protein called myoglobin. Large amounts of myoglobin can cause kidney damage or kidney failure. Other substances that are released by muscle cells may upset the balance of the minerals (electrolytes) in your blood. This imbalance causes your blood to have too much acid (acidosis). What are the causes? This condition is caused by muscle damage. Muscle damage often happens because of:  Using your muscles too much.  An injury that crushes or squeezes a muscle too tightly.  Using illegal drugs, mainly cocaine.  Alcohol abuse.  Other possible causes include:  Prescription medicines, such as those that: ? Lower cholesterol (statins). ? Treat ADHD (attention deficit hyperactivity disorder) or help with weight loss (amphetamines). ? Treat pain (opiates).  Infections.  Muscle diseases that are passed down from parent to child (inherited).  High fever.  Heatstroke.  Not having enough fluids in your body (dehydration).  Seizures.  Surgery.  What increases the risk? This condition is more likely to develop in people who:  Have a family history of muscle disease.  Take part in extreme sports, such as running in marathons.  Have diabetes.  Are older.  Abuse drugs or alcohol.  What are the  signs or symptoms? Symptoms of this condition vary. Some people have very few symptoms, and other people have many symptoms. The most common symptoms include:  Muscle pain and swelling.  Weak muscles.  Dark urine.  Feeling weak and tired.  Other symptoms include:  Nausea and vomiting.  Fever.  Pain in the abdomen.  Pain in the joints.  Symptoms of complications from this condition include:  Heart rhythm that is not normal (arrhythmia).  Seizures.  Not urinating enough because of kidney failure.  Very low blood pressure (shock). Signs of shock include dizziness, blurry vision, and clammy skin.  Bleeding that is hard to stop or control.  How is this diagnosed? This condition is diagnosed based on your medical history, your symptoms, and a physical exam. Tests may also be done, including:  Blood tests.  Urine tests to check for myoglobin.  You may also have other tests to check for causes of muscle damage and to check for complications. How is this treated? Treatment for this condition helps to:  Make sure you have enough fluids in your body.  Lower the acid levels in your blood to reverse acidosis.  Protect your kidneys.  Treatment may include:  Fluids and medicines given through an IV tube that is inserted into one of your veins.  Medicines to lower acidosis or to bring back the  balance of the minerals in your body.  Hemodialysis. This treatment uses an artificial kidney machine to filter your blood while you recover. You may have this if other treatments are not helping.  Follow these instructions at home:  Take over-the-counter and prescription medicines only as told by your health care provider.  Rest at home until your health care provider says that you can return to your normal activities.  Drink enough fluid to keep your urine clear or pale yellow.  Do not do activities that take a lot of effort (are strenuous). Ask your health care provider what  level of exercise is safe for you.  Do not abuse drugs or alcohol. If you are having problems with drug or alcohol use, ask your health care provider for help.  Keep all follow-up visits as told by your health care provider. This is important. Contact a health care provider if:  You start having symptoms of this condition after treatment. Get help right away if:  You have a seizure.  You bleed easily or cannot control bleeding.  You cannot urinate.  You have chest pain.  You have trouble breathing. This information is not intended to replace advice given to you by your health care provider. Make sure you discuss any questions you have with your health care provider. Document Released: 04/11/2004 Document Revised: 02/09/2016 Document Reviewed: 02/09/2016 Elsevier Interactive Patient Education  [employer].

## 2017-01-02 NOTE — Care Management Note (Addendum)
Case Management Note  Patient Details  Name: Jonathon [name] MRN: [id number] Date of Birth: 01/02/1936  Subjective/Objective:   Acute metabolic encephalopathy, acute rhabdomyolysis, lactic acidosis  Action/Plan: Discharge Planning: Spoke to pt and dtr, Jonathon [name] # [phone] and sister at bedside. Pt states he lives at home and sister will stay with him post dc. Pt is declining SNF and did accept [facility] program with extra aide support each day for two weeks. Pt has a shower chair at home. Requesting RW with seat and 3n1 bedside commode. Contacted [facility] DME rep for equipment to be delivered to room prior to dc. Will need HH RN, PT, OT, SW and aide orders with [facility].  [facility] consult.  PCP Jonathon Lor MD  Expected Discharge Date:  01/02/2017               Expected Discharge Plan:  [place]  In-House Referral:  Clinical Social Work  Discharge planning Services  CM Consult  Post Acute Care Choice:  Home Health Choice offered to:  Patient, Adult Children  DME Arranged:  3-N-1, Walker rolling with seat DME Agency:  [place]:  RN, PT, OT, Nurse's Aide, Social Work, Refuses SNF [place] Agency:  [place]  Status of Service:  Completed, signed off  If discussed at [employer], dates discussed:    Additional Comments:  Erenest Rasher, RN 01/02/2017, 10:15 AM

## 2017-01-02 NOTE — Progress Notes (Signed)
Physical Therapy Treatment Patient Details Name: Jonathon [name] MRN: [id number] DOB: 1936/05/11 Today's Date: 01/02/2017    History of Present Illness pt is a 81 y/o male with pmh significant for BPH, d CHF, CIDP, HTN, iron deficiency anemia, pacer, brought in to ED by family who found pt down in the floor confused.    PT Comments    Pt walked with rollator and showed safety on stairs with min guard assist.  He is safe to d/c home with his sister's supervision and [facility] home services at discharge.  His cognition seems much improved today.  PT will continue to follow acutely.    Follow Up Recommendations  Home health PT;Supervision for mobility/OOB     Equipment Recommendations  3in1 (PT);Rolling walker with 5" wheels;Other (comment) (rollator)    Recommendations for Other Services   NA     Precautions / Restrictions Precautions Precautions: Fall Restrictions Weight Bearing Restrictions: No    Mobility  Bed Mobility               General bed mobility comments: pt in recliner on arrival and back to chair at end of session  Transfers Overall transfer level: Needs assistance Equipment used: 4-wheeled walker Transfers: Sit to/from Stand Sit to Stand: Supervision         General transfer comment: supervision for safety, several rocking attempts needed to finally get up on his feet.  Verbal cues for safe hand placement and safe rollator use (locking breaks)  Ambulation/Gait Ambulation/Gait assistance: Supervision Ambulation Distance (Feet): 250 Feet Assistive device: 4-wheeled walker Gait Pattern/deviations: Step-through pattern;Staggering right;Staggering left Gait velocity: decreased Gait velocity interpretation: Below normal speed for age/gender General Gait Details: some mildly staggering gait even with rollator use.  Verbal cues for safe use of new AD.    Stairs Stairs: Yes   Stair Management: Two rails;Alternating pattern;Forwards Number of Stairs: 3 (limited  by IV line) General stair comments: Pt with reports of hip flexion pain with lifting his leg up onto the step bil, reports the soreness has been here this admission, but not before.  He has also been in the bed more than his baseline.        Balance Overall balance assessment: Needs assistance Sitting-balance support: Feet supported;No upper extremity supported Sitting balance-Leahy Scale: Good     Standing balance support: Bilateral upper extremity supported;Single extremity supported;No upper extremity supported Standing balance-Leahy Scale: Fair                              Cognition Arousal/Alertness: Awake/alert Behavior During Therapy: WFL for tasks assessed/performed Overall Cognitive Status: Impaired/Different from baseline Area of Impairment: Problem solving;Memory                     Memory: Decreased short-term memory       Problem Solving: Difficulty sequencing;Requires verbal cues;Requires tactile cues General Comments: Pt seems more clear today than previous sessions.  He is able to report his deficits, his plan going forward and recognized me from when he worked here years ago.          General Comments General comments (skin integrity, edema, etc.): Pt used to be very active, going to the [facility] daily to workout, he is now much more inactive due to his recent neuro/cognitive changes.  It would benefit him to have a regular home program to complete.  I think he would benefit from HHPT .  Pertinent Vitals/Pain Pain Assessment: Faces Faces Pain Scale: Hurts little more Pain Location: bil hip flexors/groin Pain Descriptors / Indicators: Grimacing;Guarding Pain Intervention(s): Limited activity within patient's tolerance;Monitored during session;Repositioned           PT Goals (current goals can now be found in the care plan section) Acute Rehab PT Goals Patient Stated Goal: To get back to his gardening Progress towards PT goals:  Progressing toward goals    Frequency    Min 3X/week      PT Plan Discharge plan needs to be updated       AM-PAC PT "6 Clicks" Daily Activity  Outcome Measure  Difficulty turning over in bed (including adjusting bedclothes, sheets and blankets)?: A Little Difficulty moving from lying on back to sitting on the side of the bed? : A Little Difficulty sitting down on and standing up from a chair with arms (e.g., wheelchair, bedside commode, etc,.)?: A Little Help needed moving to and from a bed to chair (including a wheelchair)?: A Little Help needed walking in hospital room?: A Little Help needed climbing 3-5 steps with a railing? : A Little 6 Click Score: 18    End of Session Equipment Utilized During Treatment: Gait belt Activity Tolerance: Patient limited by fatigue;Patient limited by pain Patient left: in chair;with call bell/phone within reach;with chair alarm set Nurse Communication: Mobility status PT Visit Diagnosis: Unsteadiness on feet (R26.81);Other abnormalities of gait and mobility (R26.89)     Time: 1829-9371 PT Time Calculation (min) (ACUTE ONLY): 20 min  Charges:  $Gait Training: 8-22 mins          Layna Roeper B. [place]en[place], [place], DPT [phone]            01/02/2017, 12:20 PM

## 2017-01-02 NOTE — Telephone Encounter (Signed)
Spoke w/dghtr [employer], is concerned of the drastic mental change in her father since his recent hospitalization. States he is not the same person he was a week ago, she was given differing dx's from the dr's, was told early stages of dementia. Would like you to see him as soon as you feel is necessary. Advsd her you had reviewed some of the hospital notes. How soon would you like to see him?

## 2017-01-02 NOTE — Discharge Summary (Signed)
Physician Discharge Summary  Jonathon [name] QBH:[id number] DOB: [birth year] DOA: 12/28/2016  PCP: Marletta Lor, MD  Admit date: 12/28/2016 Discharge date: 01/02/2017  Time spent: 45 minutes  Recommendations for Outpatient Follow-up:  Patient will be discharged to Home with [facility].  Patient will need to follow up with primary care provider within one week of discharge, repeat CK level in one week.  Patient should continue medications as prescribed.  Patient should follow a heart healthy diet.   Discharge Diagnoses:  Acute metabolic encephalopathy Acute rhabdomyolysis with elevated CK level Lactic acidosis History of SVT Essential hypertension Mildly elevated troponin History of CIDP Hypokalemia Hypomagnesemia BPH Deconditioning  Discharge Condition: Stable  Diet recommendation: Heart healthy  There were no vitals filed for this visit.  History of present illness:  On 12/28/2016 by Dr. Loraine Leriche Patient is a 81 year old male with history of BPH, chronic diastolic CHF, CIDP, hypertension, iron deficiency anemia, SVT status post pacemaker, lives alone and was brought by EMS. He was found lying in the kitchen floor by family this morning confused. At the time of my examination, patient is confused, only oriented to himself. No family member is present at the bedside. Per EDP, Dr Regenia Skeeter, patient was seen last normal last night by his daughter. His daughter tried to contact him today and was unsuccessful. Subsequently family found patient lying on the floor, EMS was called. Unclear if patient had a syncopal episode or any seizure. He lives at home alone. Unable to obtain any other history from the patient himself.   Hospital Course:  Acute metabolic encephalopathy -Unknown etiology -Mental status appears to improve this morning -Unable to do MRI as patient has pacemaker in place -Neurology consulted and appreciated and has signed off. Neurology recommended no EEGs at  this time. Follow-up with outpatient neurology and possibly repeat EEG -EEG should deltoid is no deformity activity. -Carotid ultrasound negative for significant stenosis -Echocardiogram showed EF of 09-73%, grade 1 diastolic dysfunction -PT/OT recommended SNF -Discussed with patient's daughter via phone, she feels her father's mental status has improved today.   Acute rhabdomyolysis with elevated CK level -CK peaked at 6432, was placed on IV fluids -CK currently 2446 -Would repeat CK in one week  Lactic acidosis -Likely secondary to fall and dehydration -CXR and UA unremarkable for infection -No leukocytosis and afebrile. No signs of infection noted. (patient did receive antibiotics in the ER, but were discontinued on admission) -Blood cultures show no growth to date -lactic acid resolved from 6.45 down 1.3  History of SVT -Status post pacemaker -Continue flecainide, metoprolol -Potassium 3.5, will replace for a goal of 4 -Repeat BMP in one week  Essential hypertension -Continue metoprolol and irbesartan  Mildly elevated troponin -Suspect secondary to the above, with elevated CK levels -No complaints of chest pain -Echocardiogram as above  History of CIDP -Continue outpatient neurology follow-up  Hypokalemia -resolved   Hypomagnesemia -Magnesium replaced, would repeat in one week  BPH -Continue Flomax  Deconditioning -Patient was found down at home -PT and OT recommended SNF -Discussed with social work, patient is supposed to go home with home health/Home first Program -Discussed with daughter via phone, she is concerned that her father may not be able to walk up/down the stairs at his home. Discussed with PT and patient was able to walk up/down stairs.   Consultants Neurology  Procedures  EEG Echocardiogram Carotid Doppler  Discharge Exam: Vitals:   01/02/17 0034 01/02/17 0513  BP: (!) 157/90 (!) 157/78  Pulse: (!) 102 87  Resp:  20  Temp:   98.6 F (37 C)  SpO2:  100%   Patient feeling better. Complains of bilateral inguinal soreness. Denies chest pain, shortness of breath, abdominal pain, nausea, vomiting.    General: Well developed, well nourished, NAD, appears stated age  28: NCAT, mucous membranes moist.  Cardiovascular: S1 S2 auscultated, RRR, no murmurs  Respiratory: Clear to auscultation bilaterally with equal chest rise  Abdomen: Soft, nontender, nondistended, + bowel sounds  Extremities: warm dry without cyanosis clubbing or edema  Neuro: AAOx3 (place, self, situation), nonfocal  Psych: Pleasant and appropriate  Discharge Instructions Discharge Instructions    Discharge instructions    Complete by:  As directed    Patient will be discharged to Home with [facility].  Patient will need to follow up with primary care provider within one week of discharge, repeat CK level in one week.  Patient should continue medications as prescribed.  Patient should follow a heart healthy diet.     Current Discharge Medication List    CONTINUE these medications which have NOT CHANGED   Details  b complex vitamins tablet Take 1 tablet by mouth daily as needed (for supplement).     Cyanocobalamin (VITAMIN B 12 PO) Take 1,000 mg by mouth daily as needed (for supplement).     feeding supplement, ENSURE ENLIVE, (ENSURE ENLIVE) LIQD Take 237 mLs by mouth 2 (two) times daily between meals. Qty: 237 mL, Refills: 12    flecainide (TAMBOCOR) 150 MG tablet TAKE 1/2 TABLET BY MOUTH TWICE DAILY Qty: 30 tablet, Refills: 3    Garlic 8242 MG CAPS Take 2,000 mg by mouth daily as needed (supplement).     irbesartan (AVAPRO) 300 MG tablet Take 1 tablet (300 mg total) by mouth daily. Qty: 90 tablet, Refills: 3    metoprolol succinate (TOPROL XL) 25 MG 24 hr tablet Take 1 tablet (25 mg total) by mouth daily. Qty: 30 tablet, Refills: 6    Omega-3 Fatty Acids (FISH OIL) 1000 MG CAPS Take 1 capsule by mouth daily as needed  (for supplement).     tamsulosin (FLOMAX) 0.4 MG CAPS capsule TAKE 1 CAPSULE BY MOUTH EVERY DAY Qty: 90 capsule, Refills: 0    Vitamin D, Ergocalciferol, (DRISDOL) 50000 units CAPS capsule Take 1 capsule (50,000 Units total) by mouth every 7 (seven) days. Qty: 12 capsule, Refills: 0       No Known Allergies Follow-up Information    [facility] Follow up.   Specialty:  Home Health Services Why:  Home Health RN, Physical Therapy, Occupational Therapy, aide and SW-agency will contact you with initial appointment Contact information: [address] [place] [address] [phone]        Marletta Lor, MD. Schedule an appointment as soon as possible for a visit in 1 week(s).   Specialty:  Internal Medicine Why:  Hospital follow up Contact information: [place]  [phone]            The results of significant diagnostics from this hospitalization (including imaging, microbiology, ancillary and laboratory) are listed below for reference.    Significant Diagnostic Studies: Ct Head Wo Contrast  Result Date: 12/28/2016 CLINICAL DATA:  Found on the floor. Not following commands. Confusion. EXAM: CT HEAD WITHOUT CONTRAST CT CERVICAL SPINE WITHOUT CONTRAST TECHNIQUE: Multidetector CT imaging of the head and cervical spine was performed following the standard protocol without intravenous contrast. Multiplanar CT image  reconstructions of the cervical spine were also generated. COMPARISON:  06/05/2016.  03/27/2016. FINDINGS: CT HEAD FINDINGS Brain: Mild age related atrophy. No evidence of old or acute focal infarction, mass lesion, hemorrhage, hydrocephalus or extra-axial collection. Vascular: There is atherosclerotic calcification of the major vessels at the base of the brain. Skull: Normal Sinuses/Orbits: Clear. Orbits normal except for old lamina papyracea fracture on the right. Other: None CT CERVICAL SPINE FINDINGS  Alignment: Straightening of the normal cervical lordosis. 3 mm anterolisthesis C7-T1 which is chronic. Skull base and vertebrae: Chronic posterior element fusion at C2-3. Prominent anterior osteophytes which could be associated with dysphagia. Soft tissues and spinal canal: Otherwise negative Disc levels: Degenerative spondylosis C3-4 through C7-T1. Facet arthropathy bilaterally at C7-T1 and on the right at see C3-4 and C4-5. Mild bony foraminal narrowing because of these processes. Upper chest: Negative Other: None IMPRESSION: Head CT:  No acute or traumatic finding.  Normal study for age. Cervical spine CT: No acute or traumatic finding. Extensive chronic degenerative changes as outlined above. Electronically Signed   By: Nelson Chimes M.D.   On: 12/28/2016 15:31   Ct Cervical Spine Wo Contrast  Result Date: 12/28/2016 CLINICAL DATA:  Found on the floor. Not following commands. Confusion. EXAM: CT HEAD WITHOUT CONTRAST CT CERVICAL SPINE WITHOUT CONTRAST TECHNIQUE: Multidetector CT imaging of the head and cervical spine was performed following the standard protocol without intravenous contrast. Multiplanar CT image reconstructions of the cervical spine were also generated. COMPARISON:  06/05/2016.  03/27/2016. FINDINGS: CT HEAD FINDINGS Brain: Mild age related atrophy. No evidence of old or acute focal infarction, mass lesion, hemorrhage, hydrocephalus or extra-axial collection. Vascular: There is atherosclerotic calcification of the major vessels at the base of the brain. Skull: Normal Sinuses/Orbits: Clear. Orbits normal except for old lamina papyracea fracture on the right. Other: None CT CERVICAL SPINE FINDINGS Alignment: Straightening of the normal cervical lordosis. 3 mm anterolisthesis C7-T1 which is chronic. Skull base and vertebrae: Chronic posterior element fusion at C2-3. Prominent anterior osteophytes which could be associated with dysphagia. Soft tissues and spinal canal: Otherwise negative Disc  levels: Degenerative spondylosis C3-4 through C7-T1. Facet arthropathy bilaterally at C7-T1 and on the right at see C3-4 and C4-5. Mild bony foraminal narrowing because of these processes. Upper chest: Negative Other: None IMPRESSION: Head CT:  No acute or traumatic finding.  Normal study for age. Cervical spine CT: No acute or traumatic finding. Extensive chronic degenerative changes as outlined above. Electronically Signed   By: Nelson Chimes M.D.   On: 12/28/2016 15:31   Dg Chest Portable 1 View  Result Date: 12/28/2016 CLINICAL DATA:  Altered mental status. EXAM: PORTABLE CHEST 1 VIEW COMPARISON:  06/05/2016 FINDINGS: Cardiac silhouette is normal size. No mediastinal or hilar masses. No convincing adenopathy. Left anterior chest wall sequential pacemaker is stable. Clear lungs.  No pleural effusion or pneumothorax. Skeletal structures are grossly intact. IMPRESSION: No active disease. Electronically Signed   By: Lajean Manes M.D.   On: 12/28/2016 13:58    Microbiology: Recent Results (from the past 240 hour(s))  Blood Culture (routine x 2)     Status: None   Collection Time: 12/28/16  2:02 PM  Result Value Ref Range Status   Specimen Description LEFT ANTECUBITAL  Final   Special Requests   Final    BOTTLES DRAWN AEROBIC AND ANAEROBIC Blood Culture results may not be optimal due to an inadequate volume of blood received in culture bottles   Culture NO GROWTH 5 DAYS  Final   Report Status 01/02/2017 FINAL  Final  Blood Culture (routine x 2)     Status: None   Collection Time: 12/28/16  2:47 PM  Result Value Ref Range Status   Specimen Description BLOOD LEFT FOREARM  Final   Special Requests   Final    BOTTLES DRAWN AEROBIC AND ANAEROBIC Blood Culture adequate volume   Culture NO GROWTH 5 DAYS  Final   Report Status 01/02/2017 FINAL  Final     Labs: Basic Metabolic Panel:  Recent Labs Lab 12/29/16 0548 12/30/16 0327 12/31/16 0526 01/01/17 0510 01/02/17 0343  NA 137 138 137 136  133*  K 3.3* 3.7 3.0* 4.0 3.5  CL 105 107 106 107 104  CO2 23 27 25 23  20*  GLUCOSE 105* 94 92 91 87  BUN 5* 10 6 5* 6  CREATININE 0.90 0.96 0.87 0.83 0.87  CALCIUM 8.1* 8.3* 8.2* 8.5* 8.2*  MG  --  1.5* 1.6* 1.7 1.6*   Liver Function Tests:  Recent Labs Lab 12/28/16 1335 12/30/16 0327  AST 97* 108*  ALT 48 36  ALKPHOS 72 47  BILITOT 1.1 1.1  PROT 8.5* 7.0  ALBUMIN 3.2* 2.5*   No results for input(s): LIPASE, AMYLASE in the last 168 hours.  Recent Labs Lab 12/28/16 1726  AMMONIA 36*   CBC:  Recent Labs Lab 12/28/16 1335  12/29/16 0548 12/30/16 0327 12/31/16 0526 01/01/17 0510 01/02/17 0343  WBC 8.9  < > 5.3 5.1 4.5 5.2 6.0  NEUTROABS 7.3  --   --  2.1 2.2 2.7  --   HGB 12.1*  < > 10.5* 10.3* 11.0* 10.9* 11.0*  HCT 36.4*  < > 31.6* 31.2* 33.2* 33.2* 32.5*  MCV 86.9  < > 87.1 86.9 86.5 86.5 87.4  PLT 172  < > 151 151 150 158 169  < > = values in this interval not displayed. Cardiac Enzymes:  Recent Labs Lab 12/28/16 1335 12/28/16 1850 12/29/16 0023 12/29/16 0548 12/30/16 0327 12/31/16 0840 01/01/17 0510 01/02/17 0343  CKTOTAL 720*  --   --   --  3,101* 6,432* 3,777* 2,446*  TROPONINI  --  0.03* 0.06* 0.07*  --   --   --   --    BNP: BNP (last 3 results) No results for input(s): BNP in the last 8760 hours.  ProBNP (last 3 results) No results for input(s): PROBNP in the last 8760 hours.  CBG: No results for input(s): GLUCAP in the last 168 hours.     SignedCristal Ford  [facility] 01/02/2017, 12:58 PM

## 2017-01-02 NOTE — Progress Notes (Signed)
Jonathon [name] to be D/C'd home per MD order.  Discussed with the patient and all questions fully answered.  VSS, Skin clean, dry and intact without evidence of skin break down, no evidence of skin tears noted. IV catheter discontinued intact. Site without signs and symptoms of complications. Dressing and pressure applied.  An After Visit Summary was printed and given to the patient. Patient received prescription.  D/c education completed with patient/family including follow up instructions, medication list, d/c activities limitations if indicated, with other d/c instructions as indicated by MD - patient able to verbalize understanding, all questions fully answered.   Patient instructed to return to ED, call 911, or call MD for any changes in condition.   Patient escorted via [place], and D/C home via private auto.  [name] 01/02/2017 3:50 PM

## 2017-01-02 NOTE — Consult Note (Signed)
   [facility] CM Inpatient Consult   01/02/2017  Jonathon [name] December 23, 1935 [id number]    Confirmed with inpatient LCSW that discharge plan is for [facility].   Telephone call to [name] at [phone] daughter at to confirm that discharge plan is with [facility].   Verbal consent from [place] (daughter/HCPOA) for [facility] services if transportation or pharmacy resources are needed.   [name], MSN-Ed, RN,BSN [facility] Liaison [phone]

## 2017-01-02 NOTE — Telephone Encounter (Signed)
Please note

## 2017-01-02 NOTE — Telephone Encounter (Signed)
[facility] is going to contact [facility] about opening tomorrow

## 2017-01-02 NOTE — Progress Notes (Signed)
Nutrition Brief Note  Patient identified on the Malnutrition Screening Tool (MST) Report  Wt Readings from Last 15 Encounters:  12/17/16 174 lb (78.9 kg)  12/16/16 174 lb (78.9 kg)  12/09/16 174 lb (78.9 kg)  06/18/16 172 lb 6.4 oz (78.2 kg)  06/07/16 170 lb 3.2 oz (77.2 kg)  05/04/16 175 lb (79.4 kg)  04/30/16 177 lb 6.4 oz (80.5 kg)  02/01/16 176 lb 9.6 oz (80.1 kg)  01/25/16 178 lb (80.7 kg)  01/25/16 170 lb (77.1 kg)  12/27/15 175 lb (79.4 kg)  12/26/15 175 lb (79.4 kg)  12/08/15 183 lb 6.4 oz (83.2 kg)  11/24/15 179 lb 7 oz (81.4 kg)  11/20/15 178 lb (80.7 kg)   Patient is a 81 year old male with history of BPH, chronic diastolic CHF, CIDP, hypertension, iron deficiency anemia, SVT status post pacemaker, lives alone and was brought by EMS. He was found lying in the kitchen floor by family this morning confused.   Pt sitting in recliner chair, reading newspaper at time of visit. Unable to provide accurate hx due to cognitive deficits.   Unable to obtain current wt, due to pt being in recliner chair. Last wt from 12/17/16. Wt has been stable over the past year.   Nutrition-Focused physical exam completed. Findings are no fat depletion, no muscle depletion, and no edema.   Estimated body mass index is 24.97 kg/m as calculated from the following:   Height as of 12/17/16: 5\' 10"  (1.778 m).   Weight as of 12/17/16: 174 lb (78.9 kg). Patient meets criteria for normal weight range based on current BMI.   Current diet order is Heart Healthy, patient is consuming approximately 50-75% of meals at this time. Labs and medications reviewed.   No nutrition interventions warranted at this time. If nutrition issues arise, please consult RD.   Trannie Bardales A. Jimmye Norman, RD, LDN, CDE Pager: [phone] After hours Pager: [phone]

## 2017-01-02 NOTE — Telephone Encounter (Signed)
I would like to see him in the clinic on September 11 at 2:30pm to evaluate him. Please place him on a high priority wait list for any sooner cancellations on my schedule.

## 2017-01-02 NOTE — Telephone Encounter (Signed)
Pt is in the hospital since Saturday 12/28/16 they have been treating him for sepsis and they are thinking it is early stage of dementia

## 2017-01-02 NOTE — Telephone Encounter (Signed)
An opening became available tomorrow at 11:30am.  Jonathon [name], please see if Mr. Ridener can come tomorrow - thanks.

## 2017-01-02 NOTE — Telephone Encounter (Signed)
Patient's daughter Beatriz Chancellor) called regarding her dads most recent hospital visit last Week. She said while in the hospital he received a new diagnosis while in the hospital. She would like to speak with someone. She said it was contrary to what was thought? Please call. Thanks

## 2017-01-03 ENCOUNTER — Telehealth: Payer: Self-pay | Admitting: *Deleted

## 2017-01-03 ENCOUNTER — Other Ambulatory Visit: Payer: Self-pay | Admitting: Internal Medicine

## 2017-01-03 ENCOUNTER — Telehealth: Payer: Self-pay | Admitting: Internal Medicine

## 2017-01-03 DIAGNOSIS — G9340 Encephalopathy, unspecified: Secondary | ICD-10-CM | POA: Diagnosis not present

## 2017-01-03 DIAGNOSIS — M6282 Rhabdomyolysis: Secondary | ICD-10-CM | POA: Diagnosis not present

## 2017-01-03 NOTE — Telephone Encounter (Signed)
Transition Care Management Follow-up Telephone Call  Per Discharge Summary:  Admit date: 12/28/2016 Discharge date: 01/02/2017  Time spent: 45 minutes  Recommendations for Outpatient Follow-up:  Patient will be discharged to Home with [facility].  Patient will need to follow up with primary care provider within one week of discharge, repeat CK level in one week.  Patient should continue medications as prescribed.  Patient should follow a heart healthy diet.   Discharge Diagnoses:  Acute metabolic encephalopathy Acute rhabdomyolysis with elevated CK level Lactic acidosis History of SVT Essential hypertension Mildly elevated troponin History of CIDP Hypokalemia Hypomagnesemia BPH Deconditioning  Discharge Condition: Stable  Diet recommendation: Heart healthy  --   How have you been since you were released from the hospital? "I've been okay. I have my sisters and daughter and niece who around me and that is comforting. I ate breakfast this morning."   Do you understand why you were in the hospital? yes   Do you understand the discharge instructions? yes   Where were you discharged to? Home   Items Reviewed:  Medications reviewed: yes  Allergies reviewed: yes  Dietary changes reviewed: yes  Referrals reviewed: yes, home health   Functional Questionnaire:   Activities of Daily Living (ADLs):   He states they are independent in the following: feeding, continence, grooming and dressing States they require assistance with the following: ambulation, bathing and hygiene and toileting. Needs standby assistance/extra time.   Any transportation issues/concerns?: no, uses [facility]   Any patient concerns? no   Confirmed importance and date/time of follow-up visits scheduled yes  Provider Appointment booked with Dr. Burnice Logan 01/10/2017 @ 10:30am  Confirmed with patient if condition begins to worsen call PCP or go to the ER.  Patient was given the office  number and encouraged to call back with question or concerns.  : yes

## 2017-01-03 NOTE — Telephone Encounter (Signed)
Patient unavailable at time of TCM call-home health is at his house.  Will call back this afternoon.

## 2017-01-03 NOTE — Telephone Encounter (Signed)
New message       *STAT* If patient is at the pharmacy, call can be transferred to refill team.   1. Which medications need to be refilled? (please list name of each medication and dose if known) irbesartan 300mg   2. Which pharmacy/location (including street and city if local pharmacy) is medication to be sent to? [facility] at [address] 3. Do they need a 30 day or 90 day supply?30 day

## 2017-01-04 DIAGNOSIS — M6282 Rhabdomyolysis: Secondary | ICD-10-CM | POA: Diagnosis not present

## 2017-01-04 DIAGNOSIS — G9340 Encephalopathy, unspecified: Secondary | ICD-10-CM | POA: Diagnosis not present

## 2017-01-06 ENCOUNTER — Ambulatory Visit (INDEPENDENT_AMBULATORY_CARE_PROVIDER_SITE_OTHER): Payer: Medicare Other | Admitting: Family Medicine

## 2017-01-06 ENCOUNTER — Telehealth: Payer: Self-pay | Admitting: Internal Medicine

## 2017-01-06 ENCOUNTER — Encounter: Payer: Self-pay | Admitting: Family Medicine

## 2017-01-06 VITALS — BP 132/70 | HR 78 | Temp 99.2°F | Wt 177.0 lb

## 2017-01-06 DIAGNOSIS — I5032 Chronic diastolic (congestive) heart failure: Secondary | ICD-10-CM

## 2017-01-06 DIAGNOSIS — M6282 Rhabdomyolysis: Secondary | ICD-10-CM | POA: Diagnosis not present

## 2017-01-06 DIAGNOSIS — G9340 Encephalopathy, unspecified: Secondary | ICD-10-CM | POA: Diagnosis not present

## 2017-01-06 DIAGNOSIS — M109 Gout, unspecified: Secondary | ICD-10-CM

## 2017-01-06 MED ORDER — COLCHICINE 0.6 MG PO TABS: ORAL_TABLET | ORAL | 0 refills | Status: DC

## 2017-01-06 NOTE — Telephone Encounter (Signed)
Please advise 

## 2017-01-06 NOTE — Telephone Encounter (Signed)
Per Dot Lanes at [facility] pt has temp 101.4 and bp 152/80 at home visit today. Please call. [phone]. cb

## 2017-01-06 NOTE — Telephone Encounter (Signed)
Left a detailed message on patient voicemail to call office back to schedule a OV today or tomorrow.  Call Urvi form [facility] and she stated she would contact the pt to make him aware.

## 2017-01-06 NOTE — Patient Instructions (Addendum)
A prescription was sent to your pharmacy for Colchicine.  When you first start taking this medicine for your gout flare, take 2 pills (1.2mg ).  An hour later you should take 1 pill (0.6mg ).  The next day you are to take 1 pill if needed.   We have ordered labs at this visit. It can take up to 1-2 weeks for results and processing. If results require follow up or explanation, we will call you with instructions. Clinically stable results will be released to your [facility] or sent to you via mail. If you have not heard from [place] or cannot find your results in [facility] in 2 weeks please contact our office at [phone].    Gout Gout is painful swelling that can happen in some of your joints. Gout is a type of arthritis. This condition is caused by having too much uric acid in your body. Uric acid is a chemical that is made when your body breaks down substances called purines. If your body has too much uric acid, sharp crystals can form and build up in your joints. This causes pain and swelling. Gout attacks can happen quickly and be very painful (acute gout). Over time, the attacks can affect more joints and happen more often (chronic gout). Follow these instructions at home: During a Gout Attack  If directed, put ice on the painful area: ? Put ice in a plastic bag. ? Place a towel between your skin and the bag. ? Leave the ice on for 20 minutes, 2-3 times a day.  Rest the joint as much as possible. If the joint is in your leg, you may be given crutches to use.  Raise (elevate) the painful joint above the level of your heart as often as you can.  Drink enough fluids to keep your pee (urine) clear or pale yellow.  Take over-the-counter and prescription medicines only as told by your doctor.  Do not drive or use heavy machinery while taking prescription pain medicine.  Follow instructions from your doctor about what you can or cannot eat and drink.  Return to your normal activities as told by your  doctor. Ask your doctor what activities are safe for you. Avoiding Future Gout Attacks  Follow a low-purine diet as told by a specialist (dietitian) or your doctor. Avoid foods and drinks that have a lot of purines, such as: ? Liver. ? Kidney. ? Anchovies. ? Asparagus. ? Herring. ? Mushrooms ? Mussels. ? Beer.  Limit alcohol intake to no more than 1 drink a day for nonpregnant women and 2 drinks a day for men. One drink equals 12 oz of beer, 5 oz of wine, or 1 oz of hard liquor.  Stay at a healthy weight or lose weight if you are overweight. If you want to lose weight, talk with your doctor. It is important that you do not lose weight too fast.  Start or continue an exercise plan as told by your doctor.  Drink enough fluids to keep your pee clear or pale yellow.  Take over-the-counter and prescription medicines only as told by your doctor.  Keep all follow-up visits as told by your doctor. This is important. Contact a doctor if:  You have another gout attack.  You still have symptoms of a gout attack after10 days of treatment.  You have problems (side effects) because of your medicines.  You have chills or a fever.  You have burning pain when you pee (urinate).  You have pain in your  lower back or belly. Get help right away if:  You have very bad pain.  Your pain cannot be controlled.  You cannot pee. This information is not intended to replace advice given to you by your health care provider. Make sure you discuss any questions you have with your health care provider. Document Released: 02/06/2008 Document Revised: 10/05/2015 Document Reviewed: 02/09/2015 Elsevier Interactive Patient Education  [employer].

## 2017-01-06 NOTE — Telephone Encounter (Signed)
ok 

## 2017-01-06 NOTE — Telephone Encounter (Signed)
Edwina @ [facility] called to report that she made a home visit to see pt since he had been reported as sick. She found pt with temp 102 and multiple swollen/painful joints. She is asking for pt to be seen today. They are calling pt's daughter to see if she can bring him in.  Edwina called back and scheduled pt with Dr Volanda Napoleon @ 3:30pm. Pt's daughter to bring.

## 2017-01-06 NOTE — Telephone Encounter (Signed)
Please note

## 2017-01-06 NOTE — Telephone Encounter (Signed)
Please schedule office visit today.  If patient agreeable or tomorrow if today not acceptable.

## 2017-01-06 NOTE — Progress Notes (Addendum)
Subjective:    Patient ID: Jonathon [name], male    DOB: 11-Oct-1935, 81 y.o.   MRN: [id number]  No chief complaint on file.   HPI Pt recently admitted to the hospital 8/18-8/23 for acute encephalopathy.  Patient was seen today for acute joint swelling.  He is accompanied by his niece. -Started yesterday.   -Edema in:  B/l knees, ankles, feet, hands, L elbow. -Joints warm, mildly uncomfortable. -Home health nurse reported pt febrile at 102 today. -No new med changes -Pt lives alone.  Ambulates with a walker, but difficult to get around today.  Felt a little unsteady. -Is in a wheelchair.  Feet are so edematous that pt is wearing slippers. -Pt with prior h/o gout, was on colchicine.    Past Medical History:  Diagnosis Date  . Asthma   . BENIGN PROSTATIC HYPERTROPHY, HX OF 10/25/2008  . BRADYCARDIA 2005  . Chronic diastolic congestive heart failure ([place]) 06/05/2016  . CIDP (chronic inflammatory demyelinating polyneuropathy) ([place])   . HYPERTENSION 11/21/2006  . Iron deficiency anemia, unspecified 04/19/2013  . 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 PACEMAKER, PERMANENT 2005   Gen change 2014 Medtronic Adaptic L dual-chamber pacemaker, serial [id number]   . SVT (supraventricular tachycardia) ([place])   . TOBACCO ABUSE 10/25/2008   Quit 2012    Past Surgical History:  Procedure Laterality Date  . COLONOSCOPY  [id number]  . PACEMAKER INSERTION  2005  . PERMANENT PACEMAKER GENERATOR CHANGE N/A 06/19/2012   Procedure: PERMANENT PACEMAKER GENERATOR CHANGE;  Surgeon: [name], MD; Medtronic Adaptic L dual-chamber pacemaker, serial #[id number]    . SKIN GRAFT Right 1962   wrist    Family History  Problem Relation Age of Onset  . Heart attack Father        Died, 54  . Kidney disease Father   . Parkinson's disease Mother        Died, 54  . Breast cancer Sister        Living, 24  . Diabetes type II Brother        Living, 96  . Breast cancer Sister    Living, 95  . Diabetes Daughter        Living, 58  . Diabetes Son        Living, 38  . Ovarian cancer Daughter   . Colon cancer Neg Hx   . Rectal cancer Neg Hx   . Stomach cancer Neg Hx     Social History   Social History  . Marital status: Divorced    Spouse name: N/A  . Number of children: 4  . Years of education: N/A   Occupational History  . [employer]   Social History Main Topics  . Smoking status: Former Smoker    Packs/day: 0.25    Years: 46.00    Types: Cigarettes    Start date: 03/16/1965    Quit date: 06/15/2010  . Smokeless tobacco: Never Used     Comment: quit 3 years ago  . Alcohol use No     Comment: 4 beers per day  . Drug use: No  . Sexual activity: Not on file   Other Topics Concern  . 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    Outpatient Medications Prior to Visit  Medication Sig Dispense Refill  . b complex vitamins tablet Take  1 tablet by mouth daily as needed (for supplement).     . Cyanocobalamin (VITAMIN B 12 PO) Take 1,000 mg by mouth daily as needed (for supplement).     . feeding supplement, ENSURE ENLIVE, (ENSURE ENLIVE) LIQD Take 237 mLs by mouth 2 (two) times daily between meals. 237 mL 12  . flecainide (TAMBOCOR) 150 MG tablet TAKE 1/2 TABLET BY MOUTH TWICE DAILY (Patient taking differently: TAKE 75MG  BY MOUTH TWICE DAILY) 30 tablet 3  . Garlic 6440 MG CAPS Take 2,000 mg by mouth daily as needed (supplement).     . irbesartan (AVAPRO) 300 MG tablet TAKE 1 TABLET(300 MG) BY MOUTH DAILY 30 tablet 0  . metoprolol succinate (TOPROL XL) 25 MG 24 hr tablet Take 1 tablet (25 mg total) by mouth daily. 30 tablet 6  . Omega-3 Fatty Acids (FISH OIL) 1000 MG CAPS Take 1 capsule by mouth daily as needed (for supplement).     . tamsulosin (FLOMAX) 0.4 MG CAPS capsule TAKE 1 CAPSULE BY MOUTH EVERY DAY 90 capsule 0  . Vitamin D,  Ergocalciferol, (DRISDOL) 50000 units CAPS capsule Take 1 capsule (50,000 Units total) by mouth every 7 (seven) days. 12 capsule 0   No facility-administered medications prior to visit.     No Known Allergies  ROS General: Denies chills, night sweats, changes in appetite  +fever, weight gain HEENT: Denies headaches, ear pain, changes in vision, rhinorrhea, sore throat CV: Denies CP, palpitations, SOB, orthopnea Pulm: Denies SOB, cough, wheezing GI: Denies abdominal pain, nausea, vomiting, diarrhea, constipation GU: Denies dysuria, hematuria, frequency, vaginal discharge Msk: Denies muscle cramps   +warm, swollen joints Neuro: Denies weakness, numbness, tingling Skin: Denies rashes, bruising   Psych: Denies depression, anxiety, hallucinations     Objective:    Blood pressure 132/70, pulse 78, temperature 99.2 F (37.3 C), temperature source Oral, weight 177 lb (80.3 kg), SpO2 97 %.   Gen. Pleasant, well-nourished, in no distress, normal affect HEENT: [place]/AT, face symmetric, no scleral icterus, EOMI, nares patent without drainage Lungs: no accessory muscle use, CTAB Cardiovascular: RR, heart sounds  normal, no m/r/g, peripheral edema Musculoskeletal: B/l LEs edematous from knees down.  2+ pitting edema in b/l ankles and shins.  Mild effusions of b/l knee, L>R. Neuro:  A&Ox3, CN II-XII intact, gait not assessed. Skin:  Warm, erythematous at affected joints (b/l knees, ankles, L elbow, L> R hand, b/l feet)  L elbow tophi aspirated ~4-5 cc of straw colored fluid with some blood present.     Wt Readings from Last 3 Encounters:  01/06/17 177 lb (80.3 kg)  12/17/16 174 lb (78.9 kg)  12/16/16 174 lb (78.9 kg)    Diabetic Foot Exam - Simple   No data filed     Lab Results  Component Value Date   WBC 6.0 01/02/2017   HGB 11.0 (L) 01/02/2017   HCT 32.5 (L) 01/02/2017   PLT 169 01/02/2017   GLUCOSE 87 01/02/2017   CHOL 202 (H) 01/14/2013   TRIG 206.0 (H) 01/14/2013   HDL 53.60  01/14/2013   LDLDIRECT 126.6 01/14/2013   ALT 36 12/30/2016   AST 108 (H) 12/30/2016   NA 133 (L) 01/02/2017   K 3.5 01/02/2017   CL 104 01/02/2017   CREATININE 0.87 01/02/2017   BUN 6 01/02/2017   CO2 20 (L) 01/02/2017   TSH 0.875 08/10/2014   PSA 1.53 12/01/2008   INR 1.04 08/10/2014   HGBA1C 6.0 11/05/2013    Assessment/Plan:  Acute gout of  multiple sites, unspecified cause  -febrile 102 at home, 99.2 in clinic. -Discussed possible causes gout vs psuedogout vs septic arthritis. -L elbow aspirated -Will start Colchicine.  Rx sent to pharmacy, but pt has a bottle at home.  -Reviewed rx instructions - Plan: Uric Acid, Gram Stain/Body Fluid Culture, Cell count + diff,  w/ cryst-synvl fld, CBC with Differential/Platelet, colchicine 0.6 MG tablet -Advised to keep f/u appt on Friday.   RTC or ED if becomes worse.  Chronic diastolic congestive heart failure ([place])  -Hospital d/c wt 174 lbs.  Wt in clinic 177 lbs - Plan: Brain Natriuretic Peptide -Given precautions.    Procedure Note: L Elbow Aspiration  After consent was obtained, using sterile technique the L elbow was prepped and cold spray was used as local anesthetic. The joint was entered and 3-4 ml's of blood tinged straw colored fluid was withdrawn and sent for Gram stain, culture, cell count w/ diff, and crystal.  The procedure was well tolerated.

## 2017-01-06 NOTE — Telephone Encounter (Signed)
° ° °  Jonathon [name] with [facility] home health that pt did see the pt   1 time a week for 1 week  2 times for 4 weeks 1 time a week for 1 week    [phone]

## 2017-01-07 ENCOUNTER — Telehealth: Payer: Self-pay

## 2017-01-07 ENCOUNTER — Other Ambulatory Visit: Payer: Self-pay | Admitting: *Deleted

## 2017-01-07 LAB — SYNOVIAL CELL COUNT + DIFF, W/ CRYSTALS
BASOPHILS, %: 0 %
EOSINOPHILS-SYNOVIAL: 0 % (ref 0–2)
Lymphocytes-Synovial Fld: 1 % (ref 0–74)
MONOCYTE/MACROPHAGE: 3 % (ref 0–69)
NEUTROPHIL, SYNOVIAL: 96 % — AB (ref 0–24)
Synoviocytes, %: 0 % (ref 0–15)
WBC, SYNOVIAL: 11026 {cells}/uL — AB (ref <150)

## 2017-01-07 LAB — CBC WITH DIFFERENTIAL/PLATELET
BASOS ABS: 0.1 10*3/uL (ref 0.0–0.1)
Basophils Relative: 1.1 % (ref 0.0–3.0)
EOS ABS: 0 10*3/uL (ref 0.0–0.7)
Eosinophils Relative: 0.5 % (ref 0.0–5.0)
HCT: 29.3 % — ABNORMAL LOW (ref 39.0–52.0)
Hemoglobin: 9.7 g/dL — ABNORMAL LOW (ref 13.0–17.0)
LYMPHS ABS: 1.2 10*3/uL (ref 0.7–4.0)
Lymphocytes Relative: 18.6 % (ref 12.0–46.0)
MCHC: 33.1 g/dL (ref 30.0–36.0)
MCV: 92.1 fl (ref 78.0–100.0)
Monocytes Absolute: 1.1 10*3/uL — ABNORMAL HIGH (ref 0.1–1.0)
Monocytes Relative: 16.6 % — ABNORMAL HIGH (ref 3.0–12.0)
NEUTROS ABS: 4.2 10*3/uL (ref 1.4–7.7)
NEUTROS PCT: 63.2 % (ref 43.0–77.0)
PLATELETS: 206 10*3/uL (ref 150.0–400.0)
RBC: 3.18 Mil/uL — AB (ref 4.22–5.81)
RDW: 14.3 % (ref 11.5–15.5)
WBC: 6.6 10*3/uL (ref 4.0–10.5)

## 2017-01-07 LAB — URIC ACID: URIC ACID, SERUM: 5 mg/dL (ref 4.0–7.8)

## 2017-01-07 LAB — BRAIN NATRIURETIC PEPTIDE: PRO B NATRI PEPTIDE: 532 pg/mL — AB (ref 0.0–100.0)

## 2017-01-07 MED ORDER — VALACYCLOVIR HCL 1 G PO TABS: 1000.0000 mg | ORAL_TABLET | Freq: Three times a day (TID) | ORAL | 1 refills | Status: DC

## 2017-01-07 NOTE — Telephone Encounter (Signed)
LMTCB for Jonathon [name]. Rx sent to pharmacy.

## 2017-01-07 NOTE — Telephone Encounter (Signed)
Edwina @ [facility] called to report that pt has new onset of cluster of blisters on his buttock. Pt reports area is very itchy and bothersome. She is concerned pt may have shingles. Pt was seen yesterday by Dr. Volanda Napoleon.   Dr. Raliegh Ip - Please advise. Thanks!

## 2017-01-07 NOTE — Telephone Encounter (Signed)
Please call in a new prescription for generic Valtrex 1000 mg #21 1 tablet 3 times daily.  Make patient aware

## 2017-01-07 NOTE — Telephone Encounter (Signed)
Spoke with Bethena Roys and advised. She is calling pt's daughter to make her aware. Nothing further needed at this time.

## 2017-01-07 NOTE — Patient Outreach (Signed)
[place] [facility]) Care Management  01/07/2017  KIM LAUVER February 02, 1936 [id number]   Received [place] EMMI General Discharge Referral on 01/06/17. 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  RNCM sent in basket message to  Care Management Assistant (CMA, Alycia Rossetti), and requested verification of UMR insurance status.  Patient outreach not attempted due to questions regarding [place] Management program eligibility and patient at a scheduled MD appointment when initial outreach was attempted.  Case discussed with [place] Management Care Management Assistant Alycia Rossetti),  Assistant Director [place] Callander Pulliam), and [facility] [name]), patient is being followed by [employer], does not have [employer], not currently not eligible for telephonic [facility] outreach, and will be referred to [place] Management by [facility] Program if appropriate.    RNCM will proceed with case closure.  RNCM will send case closure due to other CM program request to [facility] [place] [facility].   Jame Morrell H. Annia Friendly, BSN, [place] Management [facility] Telephonic CM Phone: [phone] Fax: [phone]

## 2017-01-08 ENCOUNTER — Ambulatory Visit (INDEPENDENT_AMBULATORY_CARE_PROVIDER_SITE_OTHER): Payer: Medicare Other | Admitting: Neurology

## 2017-01-08 ENCOUNTER — Other Ambulatory Visit (INDEPENDENT_AMBULATORY_CARE_PROVIDER_SITE_OTHER): Payer: Medicare Other

## 2017-01-08 ENCOUNTER — Encounter: Payer: Self-pay | Admitting: Neurology

## 2017-01-08 VITALS — BP 130/70 | HR 77 | Temp 98.5°F | Ht 71.0 in | Wt 177.6 lb

## 2017-01-08 DIAGNOSIS — T796XXS Traumatic ischemia of muscle, sequela: Secondary | ICD-10-CM

## 2017-01-08 DIAGNOSIS — R55 Syncope and collapse: Secondary | ICD-10-CM

## 2017-01-08 DIAGNOSIS — R4189 Other symptoms and signs involving cognitive functions and awareness: Secondary | ICD-10-CM

## 2017-01-08 DIAGNOSIS — G6181 Chronic inflammatory demyelinating polyneuritis: Secondary | ICD-10-CM | POA: Diagnosis not present

## 2017-01-08 LAB — CK: CK TOTAL: 147 U/L (ref 7–232)

## 2017-01-08 LAB — VITAMIN B12: VITAMIN B 12: 238 pg/mL (ref 211–911)

## 2017-01-08 LAB — TSH: TSH: 1.28 u[IU]/mL (ref 0.35–4.50)

## 2017-01-08 NOTE — Progress Notes (Signed)
[facility]  Follow-up Visit   Date: 01/08/17   Jonathon [name] MRN: [id number] DOB: 06-30-1935   Interim History: Jonathon [name] is a 81 y.o. 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  He is here with his daughter.    History of present illness: Patient was in his usual state of health until 2013. He has always been physical active and would go to the gym daily (walking up 10 miles/day). During the spring of 2013, he developed relatively subacute onset of left > right hamstring soreness, proximal leg weakness, and atrophy without an inciting event. Functionally, he is still able to complete all his usual activities, but has became much more cautious when climbing stairs.  In September 2014, he underwent NCS/EMG which did not show any myopathy or radiculopathy of the left lower extremity.  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  CK, aldolase, B12 was within normal limits. SPEP/UPEP shows monoclonal IgA kappa gammopathy and he underwent extensive hematologic testing, including bone marrow biopsy by Dr. Marin Olp which was nondiagnostic.    In 2015, he continued to have progressive loss of muscle bulk in the hands and proximal legs. He has difficulty with climbing stairs and opening jars, and dyspnea on exertion. 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   He reports previously drinking 4 beers/day and on the weekends up to 6 beers/day. 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  GM1 and anti-MAG antibody is negative. There is evidence of severe multilevel spinal stenosis of the cervical spine, and milder findings in the lumbar region.  CSF studies shows increase oligoclonal bands with  normal cell count, IgG index, and myelin basic proteins.  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   He completed 79-month of monthly IVMP and reports little benefit over the past few months, but no worsening.    He stopped working in spring of 2016 and going to the gym.  [facility] for syncopal spell secondary to orthostatic hypotension.  He has since been started on mestinon and is using compression stockings.  In late 2016, he was doing better ambulating independently, back at the gym, and gained weight, too.  Unfortunately, in 2017, his legs became much weaker and he suffered about 3 falls and had two fainting spells. Because of his progressive weakness, he was started on IVIG in October 2017, but only completed one dose.   UPDATE 12/09/2016:  Patient has not been seen in 9 months and self-discontinued IVIG in October 2017 because he did not feel that it was helping at the time, but now states that it may have been beneficial.  He has noticed progressive muscle atrophy of the legs.  He has one hospitalization for syncope and fall where he was on found by his daughter in the bathroom.  He has not suffered a fall in the past 6 months.  He uses a walker and cane at home, and wheelchair only for long distances.  His daughter mentions that his greatest difficulty is raising his legs to get into the shower.  He continues to drink (1) 20oz beer daily.  He has lost 5lb  in the past year and has been adding ensure to his diet daily.  He denies any new pain or paresthesias.  UPDATE 01/08/2017:  He was restarted on IVIG in early August 2018 which caused elevated blood pressure transiently.  He was doing well until 8/18 when he was found lying on the kitchen floor by family confused.  It was unclear whether he had a syncopal event or seizure.  He CKs  were elevated to 6400.  EEG showed delta slowing, no epileptiform discharges.  He has noticed increased confusion, stuttering and speech change over the past few months.  He lives by himself and since his discharge has a home health.  His daughter his concerned because he is alone at night and feels he needs 24-hr supervision, but they are unable to provide it. He was doing his own medications until his recent hospitalization and now has a home nurse.   Mood is fair and he is depressed with the fact that he is more dependent for care.   He was seen by his PCP for gout flare and started on colchicine; unfortunately his leg swelling has not improved much, but pain has resolved.   Medications:  Current Outpatient Prescriptions on File Prior to Visit  Medication Sig Dispense Refill  . b complex vitamins tablet Take 1 tablet by mouth daily as needed (for supplement).     . colchicine 0.6 MG tablet Day 1:Take 2 tablets (1.61m) by mouth.  Then take 1 tablet 1 hour later.  Day 2: take 1 tablet. 32 tablet 0  . Cyanocobalamin (VITAMIN B 12 PO) Take 1,000 mg by mouth daily as needed (for supplement).     . feeding supplement, ENSURE ENLIVE, (ENSURE ENLIVE) LIQD Take 237 mLs by mouth 2 (two) times daily between meals. 237 mL 12  . flecainide (TAMBOCOR) 150 MG tablet TAKE 1/2 TABLET BY MOUTH TWICE DAILY (Patient taking differently: TAKE 75MG BY MOUTH TWICE DAILY) 30 tablet 3  . Garlic 13086MG CAPS Take 2,000 mg by mouth daily as needed (supplement).     . irbesartan (AVAPRO) 300 MG tablet TAKE 1 TABLET(300 MG) BY MOUTH DAILY 30 tablet 0  . metoprolol succinate (TOPROL XL) 25 MG 24 hr tablet Take 1 tablet (25 mg total) by mouth daily. 30 tablet 6  . Omega-3 Fatty Acids (FISH OIL) 1000 MG CAPS Take 1 capsule by mouth daily as needed (for supplement).     . tamsulosin (FLOMAX) 0.4 MG CAPS capsule TAKE 1 CAPSULE BY MOUTH EVERY DAY 90 capsule 0  . valACYclovir (VALTREX) 1000 MG tablet Take 1 tablet (1,000 mg total)  by mouth 3 (three) times daily. 21 tablet 1  . Vitamin D, Ergocalciferol, (DRISDOL) 50000 units CAPS capsule Take 1 capsule (50,000 Units total) by mouth every 7 (seven) days. 12 capsule 0   No current facility-administered medications on file prior to visit.     Allergies: No Known Allergies   Review of Systems:  CONSTITUTIONAL: No fevers, chills, night sweats EYES: No visual changes or eye pain ENT: No hearing changes.  No history of nose bleeds.   RESPIRATORY: No cough, wheezing and shortness of breath.   CARDIOVASCULAR: Negative for chest pain, and palpitations.   GI: Negative for abdominal discomfort, blood in stools or black stools.  No recent change in bowel habits.   GU:  No history of incontinence.   MUSCLOSKELETAL: No history of joint pain or swelling.  No myalgias.   SKIN: Negative for lesions, rash, and itching.  HEMATOLOGY/ONCOLOGY: Negative for prolonged bleeding, bruising easily, and swollen nodes.  ENDOCRINE: Negative for cold or heat intolerance, polydipsia or goiter.   PSYCH:  +depression or anxiety symptoms.   NEURO: As Above.   Vital Signs:  BP 130/70   Pulse 77   Temp 98.5 F (36.9 C) (Oral)   Ht 5' 11" (1.803 m)   Wt 177 lb 9 oz (80.5 kg)   SpO2 99%   BMI 24.76 kg/m   General:  Well appearing, no acute distress, sitting in wheelchair  Neurological Exam: MENTAL STATUS including orientation to time, place, person, recent and remote memory, attention span and concentration, language, and fund of knowledge is fairly intact.  Speech is not dysarthric, there is intermittent stuttering of speech which is new. Montreal Cognitive Assessment  01/08/2017  Visuospatial/ Executive (0/5) 3  Naming (0/3) 3  Attention: Read list of digits (0/2) 2  Attention: Read list of letters (0/1) 0  Attention: Serial 7 subtraction starting at 100 (0/3) 1  Language: Repeat phrase (0/2) 2  Language : Fluency (0/1) 0  Abstraction (0/2) 2  Delayed Recall (0/5) 4  Orientation  (0/6) 5  Total 22  Adjusted Score (based on education) 23    CRANIAL NERVES:  Pupils are round and reactive to light.  Normal conjugate, extra-ocular eye movements in all directions of gaze. Mild bilateral ptosis.  Face is symmetric.  Palate elevates symmetrically.    MOTOR:  Bilateral severe ADM, ABP and marked bilateral quadriceps and FDI atrophy.  No tremor or pronator drift.  Neck flexion and extension is 5/5  Right Upper Extremity:    Left Upper Extremity:    Deltoid  5/5   Deltoid  5/5   Biceps  5/5   Biceps  5/5   Triceps  5/5   Triceps  5/5   Wrist extensors  5/5   Wrist extensors  5/5   Wrist flexors  5/5   Wrist flexors  5/5   Finger extensors  5/5   Finger extensors  5/5   Finger flexors  5/5   Finger flexors  5/5   Dorsal interossei  4/5   Dorsal interossei  4/5   Abductor pollicis  4+/5   Abductor pollicis  4+/5   Tone (Ashworth scale)  0  Tone (Ashworth scale)  0   Right Lower Extremity:    Left Lower Extremity:    Hip flexors  4+/5   Hip flexors  4+/5   Hip extensors  5/5   Hip extensors  5/5   Knee flexors  5/5   Knee flexors  5/5   Knee extensors  5/5   Knee extensors  5/5   Dorsiflexors  5/5   Dorsiflexors  5/5   Plantarflexors  5/5   Plantarflexors  5/5   Toe extensors  5/5   Toe extensors  5/5   Toe flexors  5/5   Toe flexors  5/5   Tone (Ashworth scale)  0  Tone (Ashworth scale)  0   MSRs:  Reflexes are 2+/4 in upper extremities, 1+ patella, and absent at Achilles.    COORDINATION/GAIT:  Able to rise from chair without using arms. He is able to stand from wheelchair.  Data: Labs 01/13/2013:  CK 55, aldolase 7.9, TSH 0.39, copper 97, ceruloplasmin 30 Labs 07/2013:  vitamin  B12 519, ANA neg, RF neg, ESR 5 Labs 08/10/2013:  CK 91, aldolase 14.9*, GM1 antibody - neg Labs 11/09/2013:  VEGF 118* (normal 31-86) Labs 06/02/2014:  VEGF <  31 Labs 08/23/2014:  Vitamin B12 643, folate 18.6, CK 72  CSF 09/24/2013:  R1 W0 G59 Protein 42, IgG index 4.4, MBP < 2.0, WNV -  neg, cytology - neg, OCB - 3 well defined bands present in CSF and serum, more prominent in CSF  EMG 01/25/2013:   1. 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  2. Left ulnar neuropathy at the elbow, moderately severe in degree electrically. A superimposed C8 motor radiculopathy cannot be excluded. 3. No evidence of diffuse motor axon loss or a generalized myopathy.  EMG 09/16/2013:  There is electrophysiological evidence of a chronic generalized sensorimotor polyradiculoneuropathy, axonal loss and demyelinating in type, affecting the left side. When compared to his previous EMG dated 01/25/2013, there is an interval worsening of findings.  CT cervical spine and lumbar spine 08/17/2013: 1. Advanced degenerative changes in the cervical spine, including multilevel bulky anterior endplate osteophytes.  2. Suspect multifactorial moderate to severe cervical spinal stenosis at the C5-C6 and C6-C7 levels. As the patient is not a  candidate for MRI, cervical spine CT myelogram would confirm.  3. Associated severe multifactorial neural foraminal stenosis at the right C4, right C5, right C6, bilateral C7, and bilateral C8 nerve levels.  4. Mild for age lumbar spine degenerative changes, chiefly facet arthropathy. Up to mild multifactorial spinal stenosis at L2-L3 and L3-L4.   Labs showed monoclonal IgA kappa gammopathy on serum and saw Dr. Marin Olp, who also performed bone marrow biopsy which was negative.  He also has transaminatis likely due to fatty liver.  The possibility of POEMS was raised given elevated VEGF, but repeat VEGF from 05/2014 returned normal.  CT lumbar and cervical spine 03/27/2016: Facet and disc degeneration throughout the lumbar spine. Mild spinal stenosis at L3-4 and L4-5 and L5-S1. No disc protrusion or foraminal encroachment.  Bilateral SI joint degenerative change.   C3-4:  Asymmetric RIGHT-sided facet arthropathy and uncinate spurring contributes to RIGHT C4 foraminal narrowing.  C4-5: Marked anterior osteophytosis. Posterior disc protrusion. Asymmetric facet arthropathy on the RIGHT and asymmetric RIGHT-sided uncinate hypertrophy could result in RIGHT C5 foraminal narrowing.  C5-6: Disc space narrowing. Marked anterior osteophytosis. Osseous spurring and facet arthropathy. RIGHT greater than LEFT C6 foraminal narrowing primarily due to uncinate hypertrophy.  C6-7: Severe disc space narrowing. Marked anterior osteophytosis. Osseous ridging, facet arthropathy and BILATERAL uncinate spurring contribute to BILATERAL C7 foraminal narrowing. Superimposed soft disc protrusion is possible.  NCS/EMG of the left arm and leg 03/21/2016: 1. There is electrophysiologic evidence of a chronic, generalized sensorimotor polyradiculoneuropathy affecting the left side. When compared to his EMG dated 09/17/2013, there has been mild progression of disease. 2. There is also evidence of a superimposed left ulnar neuropathy across the elbow, demyelinating with secondary axon loss, and left median neuropathy at wrist (carpal tunnel syndrome). 3. Specifically, there is no evidence of a diffuse myopathy.  Labs 03/08/2016:  CK, aldolase 8.2, B12, B1 29, ACE 57, heavy metal screen neg, c-ANCA 1:40, p-ANCA neg  CT head and cervical spine 12/28/2016: Head CT:  No acute or traumatic finding.  Normal study for age.  Cervical spine CT: No acute or traumatic finding. Extensive chronic degenerative changes as outlined above.   IMPRESSION: Encephalopathy, syncope/seizure, and new stuttering - no evidence of epileptiform discharges on EEG while hospitalized.  CT head was unrevealing.  The fact that there was urinary incontinence and elevated CK certainly raises the possibility of  seizure disoder, so set him up with ambulatory EEG monitornig.  CT head did not show acute stroke.  His mood is  down and this could be contributing to some of his cognitive changes especially as his MOCA showed deficits in multiple domains and recall was rather intact.  To better assess, he will have ambulatory EEG and be scheduled for neuropsychological testing. Check CK, vitmain B12, vitamin B1, TSH  Home safety.  There are concerns of home safety with him being alone at night so will refer to home care agency for night supervision.  We may also need to involve Case Management for in-home care resources.   Chronic inflammatory demyelinating polyradiculoneuropathy Continue IVIG 6m/kg every 3 weeks.    Return to clinic in 343-month    Greater than 50% of this 40 minute visit was spent in face-to-face counseling, explanation of diagnosis, planning of further management, and coordination of care.   Thank you for allowing me to participate in patient's care.  If I can answer any additional questions, I would be pleased to do so.    Sincerely,     K. PaPosey ProntoDO

## 2017-01-08 NOTE — Patient Instructions (Addendum)
Check labs Ambulatory EEG Neurocognitive testing Referral to [facility] case manager for in-home resources

## 2017-01-09 DIAGNOSIS — M6282 Rhabdomyolysis: Secondary | ICD-10-CM | POA: Diagnosis not present

## 2017-01-09 DIAGNOSIS — G9340 Encephalopathy, unspecified: Secondary | ICD-10-CM | POA: Diagnosis not present

## 2017-01-10 ENCOUNTER — Ambulatory Visit (INDEPENDENT_AMBULATORY_CARE_PROVIDER_SITE_OTHER): Payer: Medicare Other | Admitting: Internal Medicine

## 2017-01-10 ENCOUNTER — Encounter: Payer: Self-pay | Admitting: Internal Medicine

## 2017-01-10 VITALS — BP 144/78 | HR 76 | Temp 97.9°F | Ht 71.0 in | Wt 179.0 lb

## 2017-01-10 DIAGNOSIS — M6282 Rhabdomyolysis: Secondary | ICD-10-CM | POA: Diagnosis not present

## 2017-01-10 DIAGNOSIS — G9340 Encephalopathy, unspecified: Secondary | ICD-10-CM | POA: Diagnosis not present

## 2017-01-10 DIAGNOSIS — I10 Essential (primary) hypertension: Secondary | ICD-10-CM

## 2017-01-10 DIAGNOSIS — I5032 Chronic diastolic (congestive) heart failure: Secondary | ICD-10-CM

## 2017-01-10 DIAGNOSIS — E538 Deficiency of other specified B group vitamins: Secondary | ICD-10-CM | POA: Diagnosis not present

## 2017-01-10 DIAGNOSIS — G6181 Chronic inflammatory demyelinating polyneuritis: Secondary | ICD-10-CM | POA: Diagnosis not present

## 2017-01-10 MED ORDER — FUROSEMIDE 20 MG PO TABS: 20.0000 mg | ORAL_TABLET | Freq: Every day | ORAL | 3 refills | Status: DC

## 2017-01-10 MED ORDER — CYANOCOBALAMIN 1000 MCG/ML IJ SOLN
1000.0000 ug | Freq: Once | INTRAMUSCULAR | Status: AC
  Administered 2017-01-10: 1000 ug via INTRAMUSCULAR

## 2017-01-10 NOTE — Progress Notes (Signed)
Subjective:    Patient ID: Jonathon [name], male    DOB: 1935-09-12, 81 y.o.   MRN: [id number]  HPI  Admit date: 12/28/2016 Discharge date: 01/02/2017   Recommendations for Outpatient Follow-up:  Patient will be [facility].  Patient will need to follow up with primary care provider within one week of discharge, repeat CK level in one week.  Patient should continue medications as prescribed.  Patient should follow a heart healthy diet.   Discharge Diagnoses:  Acute metabolic encephalopathy Acute rhabdomyolysis with elevated CK level Lactic acidosis History of SVT Essential hypertension Mildly elevated troponin History of CIDP Hypokalemia Hypomagnesemia BPH Deconditioning   01/08/2017.  Neurology follow-up Vit B12 238 01/06/2017. ROV for suspected polyarticular gout. BNP 532  CK 147  Wt Readings from Last 3 Encounters:  01/10/17 179 lb (81.2 kg)  01/08/17 177 lb 9 oz (80.5 kg)  01/06/17 177 lb (80.3 kg)    01/10/2017.  81 year old patient who is seen today following a recent hospital discharge. Hospital records reviewed. He was seen in follow-up earlier this week with suspected polyarticular gout. Uric acid crystals were identified from aspiration from a left olecranon bursitis. He is now pain-free and edema.  This changes in knees, feet and ankles are starting to improve  A B12 level was checked by neurology recently and was in a low-normal range.  He was discharged from the hospital on vitamin B12, which she has been taking for only 2 days  Denies any shortness of breath  He has developed a rash involving the left buttock area and has been on Valtrex for suspected shingles  Past Medical History:  Diagnosis Date  . Asthma   . BENIGN PROSTATIC HYPERTROPHY, HX OF 10/25/2008  . BRADYCARDIA 2005  . Chronic diastolic congestive heart failure ([place]) 06/05/2016  . CIDP (chronic inflammatory demyelinating polyneuropathy) ([place])   . HYPERTENSION  11/21/2006  . Iron deficiency anemia, unspecified 04/19/2013  . 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 PACEMAKER, PERMANENT 2005   Gen change 2014 Medtronic Adaptic L dual-chamber pacemaker, serial [id number]   . SVT (supraventricular tachycardia) ([place])   . TOBACCO ABUSE 10/25/2008   Quit 2012     Social History   Social History  . Marital status: Divorced    Spouse name: N/A  . Number of children: 4  . Years of education: N/A   Occupational History  . [employer]  . retired    Social History Main Topics  . Smoking status: Former Smoker    Packs/day: 0.25    Years: 46.00    Types: Cigarettes    Start date: 03/16/1965    Quit date: 06/15/2010  . Smokeless tobacco: Never Used     Comment: quit 3 years ago  . Alcohol use No     Comment: 4 beers per day  . Drug use: No  . Sexual activity: Not on file   Other Topics Concern  . 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    Past Surgical History:  Procedure Laterality Date  . COLONOSCOPY  [id number]  . PACEMAKER INSERTION  2005  . PERMANENT PACEMAKER GENERATOR CHANGE N/A 06/19/2012   Procedure: PERMANENT PACEMAKER GENERATOR CHANGE;  Surgeon: [name], MD; Medtronic Adaptic L dual-chamber pacemaker, serial #[id number]    . SKIN GRAFT Right 1962  wrist    Family History  Problem Relation Age of Onset  . Heart attack Father        Died, 79  . Kidney disease Father   . Parkinson's disease Mother        Died, 12  . Breast cancer Sister        Living, 52  . Diabetes type II Brother        Living, 51  . Breast cancer Sister        Living, 57  . Diabetes Daughter        Living, 66  . Diabetes Son        Living, 21  . Ovarian cancer Daughter   . Colon cancer Neg Hx   . Rectal cancer Neg Hx   . Stomach cancer Neg Hx     No Known Allergies  Current Outpatient  Prescriptions on File Prior to Visit  Medication Sig Dispense Refill  . b complex vitamins tablet Take 1 tablet by mouth daily as needed (for supplement).     . colchicine 0.6 MG tablet Day 1:Take 2 tablets (1.2mg ) by mouth.  Then take 1 tablet 1 hour later.  Day 2: take 1 tablet. 32 tablet 0  . Cyanocobalamin (VITAMIN B 12 PO) Take 1,000 mg by mouth daily as needed (for supplement).     . feeding supplement, ENSURE ENLIVE, (ENSURE ENLIVE) LIQD Take 237 mLs by mouth 2 (two) times daily between meals. 237 mL 12  . flecainide (TAMBOCOR) 150 MG tablet TAKE 1/2 TABLET BY MOUTH TWICE DAILY (Patient taking differently: TAKE 75MG  BY MOUTH TWICE DAILY) 30 tablet 3  . Garlic 2440 MG CAPS Take 2,000 mg by mouth daily as needed (supplement).     . irbesartan (AVAPRO) 300 MG tablet TAKE 1 TABLET(300 MG) BY MOUTH DAILY 30 tablet 0  . metoprolol succinate (TOPROL XL) 25 MG 24 hr tablet Take 1 tablet (25 mg total) by mouth daily. 30 tablet 6  . Omega-3 Fatty Acids (FISH OIL) 1000 MG CAPS Take 1 capsule by mouth daily as needed (for supplement).     . tamsulosin (FLOMAX) 0.4 MG CAPS capsule TAKE 1 CAPSULE BY MOUTH EVERY DAY 90 capsule 0  . valACYclovir (VALTREX) 1000 MG tablet Take 1 tablet (1,000 mg total) by mouth 3 (three) times daily. 21 tablet 1  . Vitamin D, Ergocalciferol, (DRISDOL) 50000 units CAPS capsule Take 1 capsule (50,000 Units total) by mouth every 7 (seven) days. 12 capsule 0   No current facility-administered medications on file prior to visit.     BP (!) 144/78 (BP Location: Left Arm, Patient Position: Sitting, Cuff Size: Normal)   Pulse 76   Temp 97.9 F (36.6 C) (Oral)   Ht 5\' 11"  (1.803 m)   Wt 179 lb (81.2 kg)   SpO2 98%   BMI 24.97 kg/m       Review of Systems  Constitutional: Negative for appetite change, chills, fatigue and fever.  HENT: Negative for congestion, dental problem, ear pain, hearing loss, sore throat, tinnitus, trouble swallowing and voice change.   Eyes:  Negative for pain, discharge and visual disturbance.  Respiratory: Negative for cough, chest tightness, wheezing and stridor.   Cardiovascular: Negative for chest pain, palpitations and leg swelling.  Gastrointestinal: Negative for abdominal distention, abdominal pain, blood in stool, constipation, diarrhea, nausea and vomiting.  Genitourinary: Negative for difficulty urinating, discharge, flank pain, genital sores, hematuria and urgency.  Musculoskeletal: Positive for arthralgias, gait problem and joint swelling.  Negative for back pain, myalgias and neck stiffness.  Skin: Positive for rash.  Neurological: Positive for weakness. Negative for dizziness, syncope, speech difficulty, numbness and headaches.  Hematological: Negative for adenopathy. Does not bruise/bleed easily.  Psychiatric/Behavioral: Negative for behavioral problems and dysphoric mood. The patient is not nervous/anxious.        Objective:   Physical Exam  Constitutional: He is oriented to person, place, and time. He appears well-developed.  HENT:  Head: Normocephalic.  Right Ear: External ear normal.  Left Ear: External ear normal.  Eyes: Conjunctivae and EOM are normal.  Neck: Normal range of motion.  Cardiovascular: Normal rate, regular rhythm and normal heart sounds.   Pulmonary/Chest: Breath sounds normal.  Abdominal: Bowel sounds are normal.  Musculoskeletal: Normal range of motion. He exhibits edema. He exhibits no tenderness.  Left olecranon still warm to touch and swollen Edema involving ankles and feet  Neurological: He is alert and oriented to person, place, and time.  Skin:  Resolving herpetic rash.  Left buttock area  Psychiatric: He has a normal mood and affect. His behavior is normal.          Assessment & Plan:   Status post rhabdomyolysis.  Most recent CK normal Possible B12 deficiency.  Will supplement with 1 mg IM B12; will continue daily by mouth supplements.  Unclear if MMA and homocystine  level will be helpful since patient has been on oral supplements.  Will defer Resolving gout, left olecranon and left knee.  Continue short-term colchicine Diastolic heart failure.  Will place on furosemide 20.  Low-salt diet.  Encouraged.  Follow-up 4 weeks Hypertension, well-controlled.  Consider switching to losartan for the uricosuric  affect Resolving shingles left buttock area.  Patient will complete Valtrex  Follow-up 4 weeks  Rithvik Orcutt Pilar Plate

## 2017-01-10 NOTE — Patient Instructions (Signed)
Colchicine 1 tablet twice daily for 2 weeks, then discontinue  Furosemide 20 mg daily  Limit your sodium (Salt) intake  Return in 4 weeks for follow-up  Continue oral vitamin B12 1 tablet daily  Complete Valtrex

## 2017-01-12 LAB — GRAM STAIN/BODY FLUID CULTURE: Organism ID, Bacteria: NONE SEEN

## 2017-01-12 LAB — VITAMIN B1

## 2017-01-14 DIAGNOSIS — G9340 Encephalopathy, unspecified: Secondary | ICD-10-CM | POA: Diagnosis not present

## 2017-01-14 DIAGNOSIS — M6282 Rhabdomyolysis: Secondary | ICD-10-CM | POA: Diagnosis not present

## 2017-01-15 ENCOUNTER — Telehealth: Payer: Self-pay | Admitting: *Deleted

## 2017-01-15 NOTE — Telephone Encounter (Signed)
Left message for patient to call me back. 

## 2017-01-15 NOTE — Telephone Encounter (Signed)
-----   Message from [name], DO sent at 01/14/2017  3:35 PM EDT ----- Please inform patient that both his vitamin B 1 and B 12 are low. I would like for him to start B 12 injections if his nurse can administer at home 1066mcg IM injection daily x 7 days, weekly x 4 weeks, then monthly thereafter x 1 year.  I'll also start vitamin B1 (thiamine) 100 mg daily.

## 2017-01-16 ENCOUNTER — Other Ambulatory Visit: Payer: Self-pay | Admitting: *Deleted

## 2017-01-16 ENCOUNTER — Ambulatory Visit (INDEPENDENT_AMBULATORY_CARE_PROVIDER_SITE_OTHER): Payer: Medicare Other | Admitting: Neurology

## 2017-01-16 ENCOUNTER — Telehealth: Payer: Self-pay | Admitting: *Deleted

## 2017-01-16 DIAGNOSIS — R55 Syncope and collapse: Secondary | ICD-10-CM

## 2017-01-16 MED ORDER — CYANOCOBALAMIN 1000 MCG/ML IJ SOLN: 1000.0000 ug | Freq: Once | INTRAMUSCULAR | 0 refills | Status: AC

## 2017-01-16 MED ORDER — "SYRINGE 25G X 1"" 3 ML MISC": 1.0000 [IU] | Freq: Once | 0 refills | Status: AC

## 2017-01-16 NOTE — Telephone Encounter (Signed)
-----   Message from [name], DO sent at 01/14/2017  3:35 PM EDT ----- Please inform patient that both his vitamin B 1 and B 12 are low. I would like for him to start B 12 injections if his nurse can administer at home 1023mcg IM injection daily x 7 days, weekly x 4 weeks, then monthly thereafter x 1 year.  I'll also start vitamin B1 (thiamine) 100 mg daily.

## 2017-01-16 NOTE — Telephone Encounter (Signed)
Patient given results and instructions.  I will give written instructions when I see patient today.  I will also call [facility] to have them go out and do the injections.

## 2017-01-16 NOTE — Patient Instructions (Signed)
Please start vitamin B12 injections and vitamin B1 (thiamin) 100 mg daily.

## 2017-01-17 ENCOUNTER — Encounter: Payer: Medicare Other | Admitting: Internal Medicine

## 2017-01-17 DIAGNOSIS — M6282 Rhabdomyolysis: Secondary | ICD-10-CM | POA: Diagnosis not present

## 2017-01-17 DIAGNOSIS — G9340 Encephalopathy, unspecified: Secondary | ICD-10-CM | POA: Diagnosis not present

## 2017-01-18 DIAGNOSIS — M6282 Rhabdomyolysis: Secondary | ICD-10-CM | POA: Diagnosis not present

## 2017-01-18 DIAGNOSIS — G9340 Encephalopathy, unspecified: Secondary | ICD-10-CM | POA: Diagnosis not present

## 2017-01-19 DIAGNOSIS — M6282 Rhabdomyolysis: Secondary | ICD-10-CM | POA: Diagnosis not present

## 2017-01-19 DIAGNOSIS — G9340 Encephalopathy, unspecified: Secondary | ICD-10-CM | POA: Diagnosis not present

## 2017-01-20 ENCOUNTER — Encounter: Payer: Self-pay | Admitting: Family Medicine

## 2017-01-20 ENCOUNTER — Telehealth: Payer: Self-pay | Admitting: Internal Medicine

## 2017-01-20 DIAGNOSIS — M6282 Rhabdomyolysis: Secondary | ICD-10-CM | POA: Diagnosis not present

## 2017-01-20 DIAGNOSIS — G9340 Encephalopathy, unspecified: Secondary | ICD-10-CM | POA: Diagnosis not present

## 2017-01-20 NOTE — Telephone Encounter (Signed)
OT 1 time a week for 2 weeks, 2 times a week for 1 week,  1 x week for 1 week for ADL, transfers, IADL, Exercise and Heart failure education and okay to DC when goals met or maximum recovery.

## 2017-01-20 NOTE — Telephone Encounter (Signed)
A detailed voice message was left for Don,OT from [facility] with verbal orders for OT per Dr Raliegh Ip.

## 2017-01-21 ENCOUNTER — Telehealth: Payer: Self-pay | Admitting: *Deleted

## 2017-01-21 ENCOUNTER — Ambulatory Visit: Payer: Medicare Other | Admitting: Neurology

## 2017-01-21 DIAGNOSIS — M6282 Rhabdomyolysis: Secondary | ICD-10-CM | POA: Diagnosis not present

## 2017-01-21 DIAGNOSIS — G9340 Encephalopathy, unspecified: Secondary | ICD-10-CM | POA: Diagnosis not present

## 2017-01-21 NOTE — Telephone Encounter (Signed)
Patient given results

## 2017-01-21 NOTE — Procedures (Signed)
ELECTROENCEPHALOGRAM REPORT  Date of Study: 01/16/2017  Patient's Name: Jonathon [name] MRN: [id number] Date of Birth: [birth year]  Referring Provider: Dr. Narda Amber  Clinical History: This is a 81 year old man with altered mental status, stuttering, speech change.  Medications: b complex vitamins tablet   colchicine  VITAMIN B 12 ENSURE ENLIVE TAMBOCOR Garlic AVAPRO TOPROL XL  FISH OIL FLOMAX  VALTREX DRISDOL   Technical Summary: A multichannel digital EEG recording measured by the international 10-20 system with electrodes applied with paste and impedances below 5000 ohms performed in our laboratory with EKG monitoring in an awake and asleep patient.  Hyperventilation was not performed. Photic stimulation was performed.  The digital EEG was referentially recorded, reformatted, and digitally filtered in a variety of bipolar and referential montages for optimal display.    Description: The patient is awake and asleep during the recording.  During maximal wakefulness, there is a symmetric, medium voltage 8.5 Hz posterior dominant rhythm that attenuates with eye opening.  The record is symmetric.  During drowsiness and stage I sleep, there is an increase in theta slowing of the background with vertex waves seen.  Photic stimulation did not elicit any abnormalities.  There were no epileptiform discharges or electrographic seizures seen.    EKG lead showed sinus bradycardia at 60 bpm.  Impression: This awake and asleep EEG is normal.    Clinical Correlation: A normal EEG does not exclude a clinical diagnosis of epilepsy.  If further clinical questions remain, prolonged EEG may be helpful.  Clinical correlation is advised.   Ellouise Newer, M.D.

## 2017-01-21 NOTE — Telephone Encounter (Signed)
-----   Message from [name], DO sent at 01/21/2017 10:57 AM EDT ----- Please inform pt that EEG is normal and did not show any seizures.

## 2017-01-22 ENCOUNTER — Telehealth: Payer: Self-pay | Admitting: *Deleted

## 2017-01-22 DIAGNOSIS — M6282 Rhabdomyolysis: Secondary | ICD-10-CM | POA: Diagnosis not present

## 2017-01-22 DIAGNOSIS — G9340 Encephalopathy, unspecified: Secondary | ICD-10-CM | POA: Diagnosis not present

## 2017-01-22 NOTE — Telephone Encounter (Signed)
Edwina (nurse from [facility]) calling because patient's blood pressure has been increasing above his normal range. It is now 150/90.  She states that he also is show a little more confusion.  She would like to know if you would like to order some labs for him?  Bethena Roys [phone]

## 2017-01-23 DIAGNOSIS — D509 Iron deficiency anemia, unspecified: Secondary | ICD-10-CM | POA: Diagnosis not present

## 2017-01-23 DIAGNOSIS — R789 Finding of unspecified substance, not normally found in blood: Secondary | ICD-10-CM | POA: Diagnosis not present

## 2017-01-23 DIAGNOSIS — G9340 Encephalopathy, unspecified: Secondary | ICD-10-CM | POA: Diagnosis not present

## 2017-01-23 DIAGNOSIS — N400 Enlarged prostate without lower urinary tract symptoms: Secondary | ICD-10-CM | POA: Diagnosis not present

## 2017-01-23 DIAGNOSIS — M6282 Rhabdomyolysis: Secondary | ICD-10-CM | POA: Diagnosis not present

## 2017-01-23 NOTE — Telephone Encounter (Signed)
Please advise 

## 2017-01-24 NOTE — Telephone Encounter (Signed)
Please call and check on patient status Schedule follow-up appointment Monday

## 2017-01-24 NOTE — Telephone Encounter (Signed)
Spoke with [place].  She states the patient is "doing a little better".  She has had some labs done and will send a copy when the results come in.  Appointment made for Tuesday. FYI

## 2017-01-27 ENCOUNTER — Telehealth: Payer: Self-pay | Admitting: Internal Medicine

## 2017-01-27 NOTE — Telephone Encounter (Addendum)
Jonathon [name] is calling pt has an appt on 01-28-17 and she will fax another FL2 form to be completed. The FL2 is in dr Raliegh Ip folder

## 2017-01-28 ENCOUNTER — Encounter: Payer: Self-pay | Admitting: Internal Medicine

## 2017-01-28 ENCOUNTER — Ambulatory Visit (INDEPENDENT_AMBULATORY_CARE_PROVIDER_SITE_OTHER): Payer: Medicare Other | Admitting: Internal Medicine

## 2017-01-28 ENCOUNTER — Ambulatory Visit: Payer: Self-pay | Admitting: Internal Medicine

## 2017-01-28 VITALS — BP 150/88 | HR 68 | Temp 97.8°F | Ht 70.0 in | Wt 168.8 lb

## 2017-01-28 VITALS — BP 170/84 | HR 70 | Ht 70.0 in | Wt 169.0 lb

## 2017-01-28 DIAGNOSIS — I471 Supraventricular tachycardia: Secondary | ICD-10-CM

## 2017-01-28 DIAGNOSIS — I5032 Chronic diastolic (congestive) heart failure: Secondary | ICD-10-CM

## 2017-01-28 DIAGNOSIS — M109 Gout, unspecified: Secondary | ICD-10-CM

## 2017-01-28 DIAGNOSIS — I951 Orthostatic hypotension: Secondary | ICD-10-CM | POA: Diagnosis not present

## 2017-01-28 DIAGNOSIS — Z23 Encounter for immunization: Secondary | ICD-10-CM | POA: Diagnosis not present

## 2017-01-28 DIAGNOSIS — I10 Essential (primary) hypertension: Secondary | ICD-10-CM

## 2017-01-28 DIAGNOSIS — G6181 Chronic inflammatory demyelinating polyneuritis: Secondary | ICD-10-CM | POA: Diagnosis not present

## 2017-01-28 DIAGNOSIS — F1020 Alcohol dependence, uncomplicated: Secondary | ICD-10-CM | POA: Diagnosis not present

## 2017-01-28 DIAGNOSIS — I495 Sick sinus syndrome: Secondary | ICD-10-CM | POA: Diagnosis not present

## 2017-01-28 DIAGNOSIS — Z111 Encounter for screening for respiratory tuberculosis: Secondary | ICD-10-CM | POA: Diagnosis not present

## 2017-01-28 LAB — CUP PACEART REMOTE DEVICE CHECK
Battery Impedance: 303 Ohm
Battery Voltage: 2.78 V
Brady Statistic AP VS Percent: 0 %
Brady Statistic AS VP Percent: 42 %
Implantable Lead Implant Date: 20050121
Implantable Lead Location: 753860
Implantable Lead Model: 5076
Implantable Lead Model: 5076
Lead Channel Pacing Threshold Amplitude: 0.75 V
Lead Channel Pacing Threshold Amplitude: 1.125 V
Lead Channel Pacing Threshold Pulse Width: 0.4 ms
Lead Channel Sensing Intrinsic Amplitude: 5.6 mV
Lead Channel Setting Pacing Amplitude: 2.25 V
Lead Channel Setting Pacing Pulse Width: 0.4 ms
MDC IDC LEAD IMPLANT DT: 20050121
MDC IDC LEAD LOCATION: 753859
MDC IDC MSMT BATTERY REMAINING LONGEVITY: 113 mo
MDC IDC MSMT LEADCHNL RA IMPEDANCE VALUE: 606 Ohm
MDC IDC MSMT LEADCHNL RA PACING THRESHOLD AMPLITUDE: 1.25 V
MDC IDC MSMT LEADCHNL RA PACING THRESHOLD PULSEWIDTH: 0.4 ms
MDC IDC MSMT LEADCHNL RA SENSING INTR AMPL: 2.8 mV
MDC IDC MSMT LEADCHNL RV IMPEDANCE VALUE: 571 Ohm
MDC IDC MSMT LEADCHNL RV PACING THRESHOLD AMPLITUDE: 0.875 V
MDC IDC MSMT LEADCHNL RV PACING THRESHOLD PULSEWIDTH: 0.4 ms
MDC IDC MSMT LEADCHNL RV PACING THRESHOLD PULSEWIDTH: 0.4 ms
MDC IDC PG IMPLANT DT: 20140207
MDC IDC SESS DTM: 20180918151745
MDC IDC SET LEADCHNL RV PACING AMPLITUDE: 2.5 V
MDC IDC SET LEADCHNL RV SENSING SENSITIVITY: 2 mV
MDC IDC STAT BRADY AP VP PERCENT: 1 %
MDC IDC STAT BRADY AS VS PERCENT: 57 %

## 2017-01-28 MED ORDER — COLCHICINE 0.6 MG PO TABS: ORAL_TABLET | ORAL | 0 refills | Status: DC

## 2017-01-28 MED ORDER — CYANOCOBALAMIN 1000 MCG/ML IJ SOLN: 1000.0000 ug | INTRAMUSCULAR | 12 refills | Status: DC

## 2017-01-28 NOTE — Patient Instructions (Signed)
Medication Instructions:  Your physician recommends that you continue on your current medications as directed. Please refer to the Current Medication list given to you today.  Labwork: None ordered.  Testing/Procedures: None ordered.  Follow-Up: Your physician wants you to follow-up in: one year with Dr. Lovena Le.   You will receive a reminder letter in the mail two months in advance. If you don't receive a letter, please call our office to schedule the follow-up appointment.  Remote monitoring is used to monitor your Pacemaker from home. This monitoring reduces the number of office visits required to check your device to one time per year. It allows [place] to keep an eye on the functioning of your device to ensure it is working properly. You are scheduled for a device check from home on 04/29/2017. You may send your transmission at any time that day. If you have a wireless device, the transmission will be sent automatically. After your physician reviews your transmission, you will receive a postcard with your next transmission date.    Any Other Special Instructions Will Be Listed Below (If Applicable).     If you need a refill on your cardiac medications before your next appointment, please call your pharmacy.

## 2017-01-28 NOTE — Progress Notes (Signed)
HPI [name] returns today for ongoing evaluation and management of sinus node dysfunction, SVT, status post pacemaker insertion. He also is a history of progressive lower extremity neurological the new generation and a recent admission with a metabolic encephalopathy in the setting of rhabdo and heavy alcohol use. Since then he has been stable. He gets around in a wheelchair mostly and is generally weak. He denies falling or hurting himself. He is also had a problem with vitamin B-12 deficiency and is undergoing injections. He has orthostasis at times. No Known Allergies   Current Outpatient Prescriptions  Medication Sig Dispense Refill  . feeding supplement, ENSURE ENLIVE, (ENSURE ENLIVE) LIQD Take 237 mLs by mouth 2 (two) times daily between meals. 237 mL 12  . flecainide (TAMBOCOR) 150 MG tablet TAKE 1/2 TABLET BY MOUTH TWICE DAILY 30 tablet 3  . furosemide (LASIX) 20 MG tablet Take 1 tablet (20 mg total) by mouth daily. 30 tablet 3  . Garlic 7829 MG CAPS Take 2,000 mg by mouth daily as needed (supplement).     . irbesartan (AVAPRO) 300 MG tablet TAKE 1 TABLET(300 MG) BY MOUTH DAILY 30 tablet 0  . metoprolol succinate (TOPROL XL) 25 MG 24 hr tablet Take 1 tablet (25 mg total) by mouth daily. 30 tablet 6  . NON FORMULARY as directed. B 12 infusion - once a week    . Omega-3 Fatty Acids (FISH OIL) 1000 MG CAPS Take 1 capsule by mouth daily as needed (for supplement).     . tamsulosin (FLOMAX) 0.4 MG CAPS capsule TAKE 1 CAPSULE BY MOUTH EVERY DAY 90 capsule 0  . Vitamin D, Ergocalciferol, (DRISDOL) 50000 units CAPS capsule Take 1 capsule (50,000 Units total) by mouth every 7 (seven) days. 12 capsule 0  . colchicine 0.6 MG tablet Day 1:Take 2 tablets (1.2mg ) by mouth.  Then take 1 tablet 1 hour later.  Day 2: take 1 tablet. 32 tablet 0  . cyanocobalamin (,VITAMIN B-12,) 1000 MCG/ML injection Inject 1 mL (1,000 mcg total) into the muscle every 30 (thirty) days. 1 mL 12   No current  facility-administered medications for this visit.      Past Medical History:  Diagnosis Date  . Asthma   . BENIGN PROSTATIC HYPERTROPHY, HX OF 10/25/2008  . BRADYCARDIA 2005  . Chronic diastolic congestive heart failure ([place]) 06/05/2016  . CIDP (chronic inflammatory demyelinating polyneuropathy) ([place])   . HYPERTENSION 11/21/2006  . Iron deficiency anemia, unspecified 04/19/2013  . 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 PACEMAKER, PERMANENT 2005   Gen change 2014 Medtronic Adaptic L dual-chamber pacemaker, serial [id number]   . SVT (supraventricular tachycardia) ([place])   . TOBACCO ABUSE 10/25/2008   Quit 2012    ROS:   All systems reviewed and negative except as noted in the HPI.   Past Surgical History:  Procedure Laterality Date  . COLONOSCOPY  [id number]  . PACEMAKER INSERTION  2005  . PERMANENT PACEMAKER GENERATOR CHANGE N/A 06/19/2012   Procedure: PERMANENT PACEMAKER GENERATOR CHANGE;  Surgeon: [name], MD; Medtronic Adaptic L dual-chamber pacemaker, serial #[id number]    . SKIN GRAFT Right 1962   wrist     Family History  Problem Relation Age of Onset  . Heart attack Father        Died, 56  . Kidney disease Father   . Parkinson's disease Mother        Died, 57  . Breast cancer Sister  Living, 70  . Diabetes type II Brother        Living, 47  . Breast cancer Sister        Living, 33  . Diabetes Daughter        Living, 64  . Diabetes Son        Living, 66  . Ovarian cancer Daughter   . Colon cancer Neg Hx   . Rectal cancer Neg Hx   . Stomach cancer Neg Hx      Social History   Social History  . Marital status: Divorced    Spouse name: N/A  . Number of children: 4  . Years of education: N/A   Occupational History  . [employer]  . retired    Social History Main Topics  . Smoking status: Former Smoker    Packs/day: 0.25    Years: 46.00    Types: Cigarettes    Start date:  03/16/1965    Quit date: 06/15/2010  . Smokeless tobacco: Never Used     Comment: quit 3 years ago  . Alcohol use No     Comment: 4 beers per day  . Drug use: No  . Sexual activity: Not on file   Other Topics Concern  . 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     BP (!) 170/84   Pulse 70   Ht 5\' 10"  (1.778 m)   Wt 169 lb (76.7 kg)   SpO2 99%   BMI 24.25 kg/m   Physical Exam:   Chronically ill appearing  81 year old man,NAD HEENT: Unremarkable Neck:  7 cm  JVD, no thyromegally Lymphatics:  No adenopathy Back:  No CVA tenderness Lungs:  Clear, with no wheezes, rales, or rhonchi  HEART:  Regular rate rhythm, no murmurs, no rubs, no clicks Abd:  soft, positive bowel sounds, no organomegally, no rebound, no guarding Ext:  2 plus pulses, no edema, no cyanosis, no clubbing Skin:  No rashes no nodules Neuro:  CN II through XII intact, motor grossly intact   DEVICE  Normal device function.  See PaceArt for details.   Assess/Plan: 1. Sinus node dysfunction - he is asymptomatic status post pacemaker insertion. 2. SVT - he has had no real fast episodes of SVT in the last year. He does have episodes of atrial tachycardia at up 250 bpm but he is not symptomatic. 3. Pacemaker - his Medtronic dual-chamber pacemaker is working normally. We'll recheck in several months. 4. Hypertension - on my exam, his systolic blood pressure is 142/78. I've encouraged him to maintain a low-sodium diet. He admits to sodium indiscretion.  Cristopher Peru, M.D.

## 2017-01-28 NOTE — Patient Instructions (Addendum)
>   Limit your sodium (Salt) intake  Return in 3 months for follow-up  Cardiology and neurology follow-up as scheduled

## 2017-01-28 NOTE — Progress Notes (Signed)
Subjective:    Patient ID: Jonathon [name], male    DOB: May 18, 1935, 81 y.o.   MRN: [id number]  HPI  81 year old patient who has a history of CIDP who presents today for follow-up. He was hospitalized last month with acute metabolic encephalopathy and rhabdomyolysis.  Prior to this he was drinking heavily daily. He has a history of CIDP and chronic lower extremity weakness.  He continues to improve.  He does ambulate with a cane and walker.  He states that he is able to walk a proximally 15 feet before needing to rest. He is accompanied today by 2 daughters.  They will be leaving on vacation and the patient will be residing at [facility].  Respite care for 4 weeks. FL 2.  Forms completed Patient has done quite well and has been abstinent from alcohol since his hospital discharge.  His daughters are very attentive and he is also eating much better  He has a recent history of B12 deficiency and a daughter has been administering parenteral injections  Wt Readings from Last 3 Encounters:  01/28/17 168 lb 12.8 oz (76.6 kg)  01/28/17 169 lb (76.7 kg)  01/10/17 179 lb (81.2 kg)    Past Medical History:  Diagnosis Date  . Asthma   . BENIGN PROSTATIC HYPERTROPHY, HX OF 10/25/2008  . BRADYCARDIA 2005  . Chronic diastolic congestive heart failure ([place]) 06/05/2016  . CIDP (chronic inflammatory demyelinating polyneuropathy) ([place])   . HYPERTENSION 11/21/2006  . Iron deficiency anemia, unspecified 04/19/2013  . 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 PACEMAKER, PERMANENT 2005   Gen change 2014 Medtronic Adaptic L dual-chamber pacemaker, serial [id number]   . SVT (supraventricular tachycardia) ([place])   . TOBACCO ABUSE 10/25/2008   Quit 2012     Social History   Social History  . Marital status: Divorced    Spouse name: N/A  . Number of children: 4  . Years of education: N/A   Occupational History  . [employer]  . retired     Social History Main Topics  . Smoking status: Former Smoker    Packs/day: 0.25    Years: 46.00    Types: Cigarettes    Start date: 03/16/1965    Quit date: 06/15/2010  . Smokeless tobacco: Never Used     Comment: quit 3 years ago  . Alcohol use No     Comment: 4 beers per day  . Drug use: No  . Sexual activity: Not on file   Other Topics Concern  . 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    Past Surgical History:  Procedure Laterality Date  . COLONOSCOPY  [id number]  . PACEMAKER INSERTION  2005  . PERMANENT PACEMAKER GENERATOR CHANGE N/A 06/19/2012   Procedure: PERMANENT PACEMAKER GENERATOR CHANGE;  Surgeon: [name], MD; Medtronic Adaptic L dual-chamber pacemaker, serial #[id number]    . SKIN GRAFT Right 1962   wrist    Family History  Problem Relation Age of Onset  . Heart attack Father        Died, 49  . Kidney disease Father   . Parkinson's disease Mother        Died, 59  . Breast cancer Sister        Living, 63  . Diabetes type II Brother        Living,  4  . Breast cancer Sister        Living, 67  . Diabetes Daughter        Living, 37  . Diabetes Son        Living, 45  . Ovarian cancer Daughter   . Colon cancer Neg Hx   . Rectal cancer Neg Hx   . Stomach cancer Neg Hx     No Known Allergies  Current Outpatient Prescriptions on File Prior to Visit  Medication Sig Dispense Refill  . feeding supplement, ENSURE ENLIVE, (ENSURE ENLIVE) LIQD Take 237 mLs by mouth 2 (two) times daily between meals. 237 mL 12  . flecainide (TAMBOCOR) 150 MG tablet TAKE 1/2 TABLET BY MOUTH TWICE DAILY 30 tablet 3  . furosemide (LASIX) 20 MG tablet Take 1 tablet (20 mg total) by mouth daily. 30 tablet 3  . Garlic 1610 MG CAPS Take 2,000 mg by mouth daily as needed (supplement).     . irbesartan (AVAPRO) 300 MG tablet TAKE 1 TABLET(300 MG) BY MOUTH DAILY 30 tablet 0  . metoprolol  succinate (TOPROL XL) 25 MG 24 hr tablet Take 1 tablet (25 mg total) by mouth daily. 30 tablet 6  . NON FORMULARY as directed. B 12 infusion - once a week    . Omega-3 Fatty Acids (FISH OIL) 1000 MG CAPS Take 1 capsule by mouth daily as needed (for supplement).     . tamsulosin (FLOMAX) 0.4 MG CAPS capsule TAKE 1 CAPSULE BY MOUTH EVERY DAY 90 capsule 0  . Vitamin D, Ergocalciferol, (DRISDOL) 50000 units CAPS capsule Take 1 capsule (50,000 Units total) by mouth every 7 (seven) days. 12 capsule 0   No current facility-administered medications on file prior to visit.     BP (!) 150/88 (BP Location: Left Arm, Patient Position: Sitting, Cuff Size: Normal)   Pulse 68   Temp 97.8 F (36.6 C) (Oral)   Ht 5\' 10"  (1.778 m)   Wt 168 lb 12.8 oz (76.6 kg)   SpO2 98%   BMI 24.22 kg/m      Review of Systems  Constitutional: Negative for appetite change, chills, fatigue and fever.  HENT: Negative for congestion, dental problem, ear pain, hearing loss, sore throat, tinnitus, trouble swallowing and voice change.   Eyes: Negative for pain, discharge and visual disturbance.  Respiratory: Negative for cough, chest tightness, wheezing and stridor.   Cardiovascular: Negative for chest pain, palpitations and leg swelling.  Gastrointestinal: Negative for abdominal distention, abdominal pain, blood in stool, constipation, diarrhea, nausea and vomiting.  Genitourinary: Negative for difficulty urinating, discharge, flank pain, genital sores, hematuria and urgency.  Musculoskeletal: Positive for gait problem. Negative for arthralgias, back pain, joint swelling, myalgias and neck stiffness.  Skin: Negative for rash.  Neurological: Positive for weakness. Negative for dizziness, syncope, speech difficulty, numbness and headaches.  Hematological: Negative for adenopathy. Does not bruise/bleed easily.  Psychiatric/Behavioral: Negative for behavioral problems and dysphoric mood. The patient is not nervous/anxious.          Objective:   Physical Exam  Constitutional: He is oriented to person, place, and time. He appears well-developed and well-nourished. No distress.  Alert, appropriate, no distress Blood pressure 140/80  HENT:  Head: Normocephalic.  Right Ear: External ear normal.  Left Ear: External ear normal.  Eyes: Conjunctivae and EOM are normal.  Neck: Normal range of motion.  Cardiovascular: Normal rate and normal heart sounds.   Pulmonary/Chest: Breath sounds normal.  Abdominal: Bowel sounds are normal.  Musculoskeletal:  Normal range of motion. He exhibits no edema or tenderness.  Neurological: He is alert and oriented to person, place, and time.  Lower extremity weakness  Psychiatric: He has a normal mood and affect. His behavior is normal.          Assessment & Plan:   Status post hospital admission for metabolic encephalopathy in the setting of rhabdomyolysis and heavy alcohol use. Continues to improve.  Remains abstinent from alcohol Hypertension, fair control.  History of orthostatic hypotension.  No change in medical regimen today CIDP stable Physical deconditioning.  FL 2.  Forms completed for 4 week respite stay Vitamin B12 deficiency.  Continue monthly IM injections  Follow-up 3 months  Nazyia Gaugh Pilar Plate

## 2017-01-29 ENCOUNTER — Telehealth: Payer: Self-pay | Admitting: Internal Medicine

## 2017-01-29 DIAGNOSIS — M6282 Rhabdomyolysis: Secondary | ICD-10-CM | POA: Diagnosis not present

## 2017-01-29 DIAGNOSIS — G9340 Encephalopathy, unspecified: Secondary | ICD-10-CM | POA: Diagnosis not present

## 2017-01-29 NOTE — Telephone Encounter (Signed)
Facility is calling stating that the FL2 has a issue with Rx flecainide (TAMBOCOR) 150 MG they need clarification faxed to [phone](F)

## 2017-01-30 DIAGNOSIS — M6282 Rhabdomyolysis: Secondary | ICD-10-CM | POA: Diagnosis not present

## 2017-01-30 DIAGNOSIS — G9340 Encephalopathy, unspecified: Secondary | ICD-10-CM | POA: Diagnosis not present

## 2017-01-30 NOTE — Telephone Encounter (Signed)
Calling back to get instructions on pt's   flecainide (TAMBOCOR) 150 MG tablet  (how much/how often?)  Also pt's med list from the daughter states pt is taking Fish oil, garlic and Vit D (1 X wk) But that was not on the FL2.  Please clarify this as well.  [place] with [facility] at [place] Fax: [phone]  Phone:  [phone]

## 2017-01-31 DIAGNOSIS — M6282 Rhabdomyolysis: Secondary | ICD-10-CM | POA: Diagnosis not present

## 2017-01-31 DIAGNOSIS — G9340 Encephalopathy, unspecified: Secondary | ICD-10-CM | POA: Diagnosis not present

## 2017-02-02 DIAGNOSIS — G9340 Encephalopathy, unspecified: Secondary | ICD-10-CM | POA: Diagnosis not present

## 2017-02-02 DIAGNOSIS — M6282 Rhabdomyolysis: Secondary | ICD-10-CM | POA: Diagnosis not present

## 2017-02-03 DIAGNOSIS — G9340 Encephalopathy, unspecified: Secondary | ICD-10-CM | POA: Diagnosis not present

## 2017-02-03 DIAGNOSIS — M6282 Rhabdomyolysis: Secondary | ICD-10-CM | POA: Diagnosis not present

## 2017-02-04 DIAGNOSIS — G9340 Encephalopathy, unspecified: Secondary | ICD-10-CM | POA: Diagnosis not present

## 2017-02-04 DIAGNOSIS — M6282 Rhabdomyolysis: Secondary | ICD-10-CM | POA: Diagnosis not present

## 2017-02-04 NOTE — Telephone Encounter (Signed)
Tambocor 150  1 half tablet twice daily Okay to hold fish oil, garlic and vitamin D

## 2017-02-04 NOTE — Telephone Encounter (Signed)
Please advise 

## 2017-02-05 MED ORDER — FLECAINIDE ACETATE 150 MG PO TABS: 75.0000 mg | ORAL_TABLET | Freq: Two times a day (BID) | ORAL | 3 refills | Status: DC

## 2017-02-05 NOTE — Telephone Encounter (Signed)
Rx printed awaiting to be singed.

## 2017-02-06 NOTE — Telephone Encounter (Signed)
Rx's were sent to facility

## 2017-02-07 ENCOUNTER — Ambulatory Visit: Payer: Medicare Other | Admitting: Internal Medicine

## 2017-02-24 DIAGNOSIS — M6282 Rhabdomyolysis: Secondary | ICD-10-CM | POA: Diagnosis not present

## 2017-02-24 DIAGNOSIS — G9340 Encephalopathy, unspecified: Secondary | ICD-10-CM | POA: Diagnosis not present

## 2017-03-01 ENCOUNTER — Other Ambulatory Visit (HOSPITAL_COMMUNITY): Payer: Self-pay | Admitting: Nurse Practitioner

## 2017-03-03 DIAGNOSIS — M6282 Rhabdomyolysis: Secondary | ICD-10-CM | POA: Diagnosis not present

## 2017-03-03 DIAGNOSIS — G9340 Encephalopathy, unspecified: Secondary | ICD-10-CM | POA: Diagnosis not present

## 2017-03-04 DIAGNOSIS — G9340 Encephalopathy, unspecified: Secondary | ICD-10-CM | POA: Diagnosis not present

## 2017-03-04 DIAGNOSIS — D509 Iron deficiency anemia, unspecified: Secondary | ICD-10-CM | POA: Diagnosis not present

## 2017-03-10 DIAGNOSIS — D509 Iron deficiency anemia, unspecified: Secondary | ICD-10-CM | POA: Diagnosis not present

## 2017-03-10 DIAGNOSIS — G9340 Encephalopathy, unspecified: Secondary | ICD-10-CM | POA: Diagnosis not present

## 2017-03-17 ENCOUNTER — Other Ambulatory Visit: Payer: Self-pay | Admitting: Internal Medicine

## 2017-03-24 DIAGNOSIS — D509 Iron deficiency anemia, unspecified: Secondary | ICD-10-CM | POA: Diagnosis not present

## 2017-03-24 DIAGNOSIS — G9340 Encephalopathy, unspecified: Secondary | ICD-10-CM | POA: Diagnosis not present

## 2017-03-31 DIAGNOSIS — G9340 Encephalopathy, unspecified: Secondary | ICD-10-CM | POA: Diagnosis not present

## 2017-03-31 DIAGNOSIS — D509 Iron deficiency anemia, unspecified: Secondary | ICD-10-CM | POA: Diagnosis not present

## 2017-04-21 ENCOUNTER — Ambulatory Visit: Payer: Medicare Other | Admitting: Neurology

## 2017-04-21 ENCOUNTER — Encounter: Payer: Medicare Other | Admitting: Psychology

## 2017-04-23 ENCOUNTER — Telehealth: Payer: Self-pay | Admitting: Neurology

## 2017-04-23 NOTE — Telephone Encounter (Signed)
Pt's daughter wants a call back regarding pt's appointments with Dr Si Raider, he was rescheduled to Friday the 14th and the 18th but the daughter said she can not make the Friday appointment and is upset that the next available isn't until April and wants to talk to Dr Posey Pronto about that

## 2017-04-24 ENCOUNTER — Telehealth: Payer: Self-pay

## 2017-04-24 DIAGNOSIS — D509 Iron deficiency anemia, unspecified: Secondary | ICD-10-CM | POA: Diagnosis not present

## 2017-04-24 DIAGNOSIS — G9340 Encephalopathy, unspecified: Secondary | ICD-10-CM | POA: Diagnosis not present

## 2017-04-24 NOTE — Telephone Encounter (Signed)
FYI

## 2017-04-24 NOTE — Telephone Encounter (Signed)
Please see below message, Please advise

## 2017-04-24 NOTE — Telephone Encounter (Signed)
Katharine Look with [facility] home health called to report pt.'s BP "is a little elevated today. Right arm 151/110      Left arm  150/102. States he is asymptomatic, but was worried about some "family matters." Will forward information to provider.

## 2017-04-24 NOTE — Telephone Encounter (Signed)
Suggest follow-up office visit next week to further assess blood pressure

## 2017-04-25 ENCOUNTER — Encounter: Payer: Medicare Other | Admitting: Psychology

## 2017-04-25 NOTE — Telephone Encounter (Signed)
Has patient been scheduled for office follow-up next week??

## 2017-04-28 NOTE — Telephone Encounter (Signed)
Pt has an OV on 04/30/17 @ 1130am

## 2017-04-30 ENCOUNTER — Encounter: Payer: Self-pay | Admitting: Internal Medicine

## 2017-04-30 ENCOUNTER — Ambulatory Visit (INDEPENDENT_AMBULATORY_CARE_PROVIDER_SITE_OTHER): Payer: Medicare Other | Admitting: Internal Medicine

## 2017-04-30 VITALS — BP 170/82 | HR 76 | Temp 98.5°F | Ht 70.0 in | Wt 172.6 lb

## 2017-04-30 DIAGNOSIS — E538 Deficiency of other specified B group vitamins: Secondary | ICD-10-CM | POA: Diagnosis not present

## 2017-04-30 DIAGNOSIS — I951 Orthostatic hypotension: Secondary | ICD-10-CM

## 2017-04-30 DIAGNOSIS — I5032 Chronic diastolic (congestive) heart failure: Secondary | ICD-10-CM | POA: Diagnosis not present

## 2017-04-30 DIAGNOSIS — F1020 Alcohol dependence, uncomplicated: Secondary | ICD-10-CM

## 2017-04-30 DIAGNOSIS — I10 Essential (primary) hypertension: Secondary | ICD-10-CM

## 2017-04-30 MED ORDER — FUROSEMIDE 20 MG PO TABS: 20.0000 mg | ORAL_TABLET | Freq: Every day | ORAL | 3 refills | Status: DC

## 2017-04-30 NOTE — Patient Instructions (Signed)
Limit your sodium (Salt) intake  Return in 4 months for follow-up  Please check your blood pressure on a regular basis.  If it is consistently greater than 150/90, please make an office appointment.

## 2017-04-30 NOTE — Progress Notes (Signed)
Subjective:    Patient ID: Jonathon [name], male    DOB: 1936-02-14, 81 y.o.   MRN: [id number]  HPI  81 year old patient who has a history of essential hypertension.  He also has a history of chronic diastolic heart failure that has been stable. He is followed by neurology for CIDP.  This has been fairly stable;.  No falls since his last visit here.  He continues to use a cane He lives alone and there has been a issue with excessive alcohol use in the past.  He does have daughters that are very attentive.  He denies any recent alcohol use he has a history of B12 deficiency and continues to receive monthly parenteral injections   Past Medical History:  Diagnosis Date  . Asthma   . BENIGN PROSTATIC HYPERTROPHY, HX OF 10/25/2008  . BRADYCARDIA 2005  . Chronic diastolic congestive heart failure ([place]) 06/05/2016  . CIDP (chronic inflammatory demyelinating polyneuropathy) ([place])   . HYPERTENSION 11/21/2006  . Iron deficiency anemia, unspecified 04/19/2013  . 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 PACEMAKER, PERMANENT 2005   Gen change 2014 Medtronic Adaptic L dual-chamber pacemaker, serial [id number]   . SVT (supraventricular tachycardia) ([place])   . TOBACCO ABUSE 10/25/2008   Quit 2012     Social History   Socioeconomic History  . Marital status: Divorced    Spouse name: Not on file  . Number of children: 4  . Years of education: Not on file  . Highest education level: Not on file  Social Needs  . Financial resource strain: Not on file  . Food insecurity - worry: Not on file  . Food insecurity - inability: Not on file  . Transportation needs - medical: Not on file  . Transportation needs - non-medical: Not on file  Occupational History  . Occupation: ENVIROMENTAL Location manager: [employer]  . Occupation: retired  Tobacco Use  . Smoking status: Former Smoker    Packs/day: 0.25    Years: 46.00    Pack years: 11.50    Types: Cigarettes      Start date: 03/16/1965    Last attempt to quit: 06/15/2010    Years since quitting: 6.8  . Smokeless tobacco: Never Used  . Tobacco comment: quit 3 years ago  Substance and Sexual Activity  . Alcohol use: No    Alcohol/week: 0.0 oz    Comment: 4 beers per day  . Drug use: No  . Sexual activity: Not on file  Other Topics Concern  . Not on file  Social History Narrative   Has relocated from [place] in 2007. 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    Past Surgical History:  Procedure Laterality Date  . COLONOSCOPY  [id number]  . PACEMAKER INSERTION  2005  . PERMANENT PACEMAKER GENERATOR CHANGE N/A 06/19/2012   Procedure: PERMANENT PACEMAKER GENERATOR CHANGE;  Surgeon: [name], MD; Medtronic Adaptic L dual-chamber pacemaker, serial #[id number]    . SKIN GRAFT Right 1962   wrist    Family History  Problem Relation Age of Onset  . Heart attack Father        Died, 44  . Kidney disease Father   . Parkinson's disease Mother        Died, 72  . Breast cancer Sister        Living, 37  . Diabetes type II Brother        Living, 57  .  Breast cancer Sister        Living, 25  . Diabetes Daughter        Living, 11  . Diabetes Son        Living, 16  . Ovarian cancer Daughter   . Colon cancer Neg Hx   . Rectal cancer Neg Hx   . Stomach cancer Neg Hx     No Known Allergies  Current Outpatient Medications on File Prior to Visit  Medication Sig Dispense Refill  . colchicine 0.6 MG tablet Day 1:Take 2 tablets (1.2mg ) by mouth.  Then take 1 tablet 1 hour later.  Day 2: take 1 tablet. 32 tablet 0  . cyanocobalamin (,VITAMIN B-12,) 1000 MCG/ML injection Inject 1 mL (1,000 mcg total) into the muscle every 30 (thirty) days. 1 mL 12  . feeding supplement, ENSURE ENLIVE, (ENSURE ENLIVE) LIQD Take 237 mLs by mouth 2 (two) times daily between meals. 237 mL 12  . flecainide (TAMBOCOR) 150 MG tablet Take 0.5 tablets (75 mg total) by mouth 2 (two) times daily. 30 tablet 3  .  Garlic 3875 MG CAPS Take 2,000 mg by mouth daily as needed (supplement).     . irbesartan (AVAPRO) 300 MG tablet TAKE 1 TABLET(300 MG) BY MOUTH DAILY 90 tablet 2  . metoprolol succinate (TOPROL-XL) 25 MG 24 hr tablet TAKE 1 TABLET(25 MG) BY MOUTH DAILY 30 tablet 10  . NON FORMULARY as directed. B 12 infusion - once a week    . Omega-3 Fatty Acids (FISH OIL) 1000 MG CAPS Take 1 capsule by mouth daily as needed (for supplement).     . tamsulosin (FLOMAX) 0.4 MG CAPS capsule TAKE 1 CAPSULE BY MOUTH EVERY DAY 90 capsule 0  . Vitamin D, Ergocalciferol, (DRISDOL) 50000 units CAPS capsule Take 1 capsule (50,000 Units total) by mouth every 7 (seven) days. 12 capsule 0   No current facility-administered medications on file prior to visit.     BP (!) 170/82 (BP Location: Left Arm, Patient Position: Sitting, Cuff Size: Normal)   Pulse 76   Temp 98.5 F (36.9 C) (Oral)   Ht 5\' 10"  (1.778 m)   Wt 172 lb 9.6 oz (78.3 kg)   SpO2 95%   BMI 24.77 kg/m     Review of Systems  Constitutional: Negative for appetite change, chills, fatigue and fever.  HENT: Negative for congestion, dental problem, ear pain, hearing loss, sore throat, tinnitus, trouble swallowing and voice change.   Eyes: Negative for pain, discharge and visual disturbance.  Respiratory: Negative for cough, chest tightness, wheezing and stridor.   Cardiovascular: Negative for chest pain, palpitations and leg swelling.  Gastrointestinal: Negative for abdominal distention, abdominal pain, blood in stool, constipation, diarrhea, nausea and vomiting.  Genitourinary: Negative for difficulty urinating, discharge, flank pain, genital sores, hematuria and urgency.  Musculoskeletal: Positive for gait problem. Negative for arthralgias, back pain, joint swelling, myalgias and neck stiffness.  Skin: Negative for rash.  Neurological: Positive for weakness. Negative for dizziness, syncope, speech difficulty, numbness and headaches.  Hematological:  Negative for adenopathy. Does not bruise/bleed easily.  Psychiatric/Behavioral: Negative for behavioral problems and dysphoric mood. The patient is not nervous/anxious.        Objective:   Physical Exam  Constitutional: He is oriented to person, place, and time. He appears well-developed.  Blood pressure 160/80  HENT:  Head: Normocephalic.  Right Ear: External ear normal.  Left Ear: External ear normal.  Eyes: Conjunctivae and EOM are normal.  Neck: Normal range  of motion.  Cardiovascular: Normal rate and normal heart sounds.  Pulmonary/Chest: Breath sounds normal.  Abdominal: Bowel sounds are normal.  Musculoskeletal: Normal range of motion. He exhibits no edema or tenderness.  Neurological: He is alert and oriented to person, place, and time.  Lower extremity weakness.  Walks with a cane  Psychiatric: He has a normal mood and affect. His behavior is normal.          Assessment & Plan:   Essential hypertension History of orthostatic hypotension.  Unclear whether patient is using furosemide compliant with his medications encourage we will continue present regimen B12 deficiency continue monthly injections CIDP.  Follow-up neurology next month as scheduled Chronic diastolic heart failure compensated  Nyoka Cowden

## 2017-05-01 ENCOUNTER — Telehealth: Payer: Self-pay | Admitting: Internal Medicine

## 2017-05-01 DIAGNOSIS — D509 Iron deficiency anemia, unspecified: Secondary | ICD-10-CM | POA: Diagnosis not present

## 2017-05-01 DIAGNOSIS — G9340 Encephalopathy, unspecified: Secondary | ICD-10-CM | POA: Diagnosis not present

## 2017-05-01 NOTE — Telephone Encounter (Signed)
Katharine Look, RN with [facility] called in with concerns for hypertension. BP left arm 173/110 BP right arm 175/96  He is taking his medications properly.   No s/s of dizziness, weakness, etc per Katharine Look, RN.   Pt was just seen by Dr. Burnice Logan yesterday for for hypertension.     I routed a note to Dr. Truddie Hidden nurse pool.   The nurse can be reached at [phone].

## 2017-05-01 NOTE — Telephone Encounter (Signed)
Spoke with Katharine Look, RN and infomed her that pt was seen in the office yesterday.  He was instructed to please follow-up neurology next month as scheduled Limit your sodium (Salt) intake, (Nurse stated that pt admitted to eating french fries today).  Return in 4 months for follow-up. Check your blood pressure on a regular basis.  If it is consistently greater than 150/90, please make an office appointment. Nurse verbalized understanding.

## 2017-05-19 ENCOUNTER — Encounter: Payer: Medicare Other | Admitting: Psychology

## 2017-05-20 ENCOUNTER — Encounter: Payer: Self-pay | Admitting: Psychology

## 2017-05-20 ENCOUNTER — Ambulatory Visit (INDEPENDENT_AMBULATORY_CARE_PROVIDER_SITE_OTHER): Payer: Medicare Other | Admitting: Psychology

## 2017-05-20 DIAGNOSIS — G3184 Mild cognitive impairment, so stated: Secondary | ICD-10-CM

## 2017-05-20 DIAGNOSIS — G9340 Encephalopathy, unspecified: Secondary | ICD-10-CM

## 2017-05-20 DIAGNOSIS — R4189 Other symptoms and signs involving cognitive functions and awareness: Secondary | ICD-10-CM | POA: Diagnosis not present

## 2017-05-20 NOTE — Progress Notes (Signed)
   Neuropsychology Note  Jonathon [name] completed 60 minutes of neuropsychological testing with technician, Milana Kidney, BS, under the supervision of Dr. Macarthur Critchley, Licensed Psychologist. The patient did not appear overtly distressed by the testing session, per behavioral observation or via self-report to the technician. Rest breaks were offered.   Communication between the psychologist and technician was ongoing throughout the testing session and changes were made as deemed necessary based on patient performance on testing, technician observations and additional pertinent factors (e.g., current level of functioning, level of presumed impairment, nature of symptoms, emotional and behavioral responses during the status exam/testing, level of literacy, estimated premorbid baseline intellectual abilities, and observed level of motivation/effort).  Jonathon [name] will return within 2 weeks for a feedback session with Dr. Si Raider at which time his test performances, clinical impressions and treatment recommendations will be reviewed in detail. The patient understands he can contact our office should he require our assistance before this time.  60 minutes spent face-to-face with patient administering standardized tests Environmental education officer). 30 minutes spent scoring Environmental education officer).  Full report to follow.

## 2017-05-20 NOTE — Progress Notes (Signed)
NEUROBEHAVIORAL STATUS EXAM (CPT: 207 [id number])  Name: Jonathon [name] Date of Birth: [birth year] Date of Interview: 05/20/2017  Reason for Referral:  Jonathon [name] is a 82 y.o. male who is referred for neuropsychological evaluation by Dr. Narda Amber of [facility] due to concerns about memory loss/confusion and possible seizure. This patient is accompanied in the office by his daughter, Jonathon [name], who supplements the history.  History of Presenting Problem:  Jonathon [name] is followed by Dr. Posey Pronto for chronic inflammatory demyelinating polyneuropathy (CIDP). He had no prior history of cognitive decline/dementia, but was hospitalized 12/28/2016-01/02/2017 for altered mental status. He had been found confused and lying on the floor by his daughter on that date. There was no evidence of epileptiform discharges on EEG while hospitalized and CT of the head was unrevealing. He was diagnosed with metabolic encephalopathy. He continued to be confused and not himself after discharge home. Dr. Posey Pronto saw him on 01/08/2017 and noted speech changes/stuttering that was not present before. MoCA was 23/30. Labwork was completed, and vitamin B1 and B12 were found to be low. He has been getting B12 injections and taking oral supplement. EEG was completed on 01/16/2017 and was normal. He was referred for neurocognitive evaluation to further assess and delineate cognitive deficits.  Fortunately, since August 2018, the patient's condition has improved. He reports that it takes him longer to retrieve some words/information, and he still sometimes slurs his words when he speaks quickly, but otherwise he has not had cognitive concerns recently. His daughter agrees that his mental status/cognition has improved markedly since starting B12 injections. He is not forgetting recent conversations/events, not misplacing items, no significant problems with attention/concentration. He does not get disoriented. He has some trouble keeping track of the  day of the week but otherwise is well oriented to time. He still enjoys reading and watching certain television programs/sports. His son moved here to stay with him and provide support. He moved in a week ago. It has been very positive for both of them. Family helps with transportation (patient no longer driving), medication management (filling pillbox and reminding him if necessary), finances, and appointments. He is able to cook and prepare meals without any problems.   He has not had any falls since the hospitalization in August 2018. He is very careful and vigilant when he walks. He uses a cane.  He has had some adjustment related depression due to coping with physical limitations and reduced independence but does not appear clinically depressed. Getting out of the house helps his mood a lot. His daughter is hoping to get a family membership to the [facility] so that they can go together and he can get out more.  He is no longer drinking alcohol daily as he had in the past. He only has one glass of wine at special family functions.   He denies any problems with sleep or appetite.   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 He worked part time for [employer] from 2004-2017. He is now retired. Marital history: Divorced. Ex-wife lives with his daughter Jonathon [name]. Patient has 4 children, 8 grandchildren and 3 great grandchildren.  Alcohol: Former daily drinker. Now minimal alcohol and controlled by family. Tobacco: Former smoker, quit 2012   Medical History: Past Medical History:  Diagnosis Date  . Asthma   .  BENIGN PROSTATIC HYPERTROPHY, HX OF 10/25/2008  . BRADYCARDIA 2005  . Chronic diastolic congestive heart failure ([place]) 06/05/2016  . CIDP (chronic inflammatory demyelinating polyneuropathy) ([place])   .  HYPERTENSION 11/21/2006  . Iron deficiency anemia, unspecified 04/19/2013  . 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 PACEMAKER, PERMANENT 2005   Gen change 2014 Medtronic Adaptic L dual-chamber pacemaker, serial [id number]   . SVT (supraventricular tachycardia) ([place])   . TOBACCO ABUSE 10/25/2008   Quit 2012      Current Medications:  Outpatient Encounter Medications as of 05/20/2017  Medication Sig  . colchicine 0.6 MG tablet Day 1:Take 2 tablets (1.2mg ) by mouth.  Then take 1 tablet 1 hour later.  Day 2: take 1 tablet.  . cyanocobalamin (,VITAMIN B-12,) 1000 MCG/ML injection Inject 1 mL (1,000 mcg total) into the muscle every 30 (thirty) days.  . feeding supplement, ENSURE ENLIVE, (ENSURE ENLIVE) LIQD Take 237 mLs by mouth 2 (two) times daily between meals.  . flecainide (TAMBOCOR) 150 MG tablet Take 0.5 tablets (75 mg total) by mouth 2 (two) times daily.  . furosemide (LASIX) 20 MG tablet Take 1 tablet (20 mg total) by mouth daily.  . Garlic 1324 MG CAPS Take 2,000 mg by mouth daily as needed (supplement).   . irbesartan (AVAPRO) 300 MG tablet TAKE 1 TABLET(300 MG) BY MOUTH DAILY  . metoprolol succinate (TOPROL-XL) 25 MG 24 hr tablet TAKE 1 TABLET(25 MG) BY MOUTH DAILY  . NON FORMULARY as directed. B 12 infusion - once a week  . Omega-3 Fatty Acids (FISH OIL) 1000 MG CAPS Take 1 capsule by mouth daily as needed (for supplement).   . tamsulosin (FLOMAX) 0.4 MG CAPS capsule TAKE 1 CAPSULE BY MOUTH EVERY DAY  . Vitamin D, Ergocalciferol, (DRISDOL) 50000 units CAPS capsule Take 1 capsule (50,000 Units total) by mouth every 7 (seven) days.   No facility-administered encounter medications on file as of 05/20/2017.      Behavioral Observations:   Appearance: Neatly, casually and appropriately dressed and groomed Gait: Ambulated with a cane, no gross abnormalities observed Speech: Fluent; generally normal rate, rhythm and volume. 1-2 occasions of brief slurred speech  when trying to speak quickly. Thought process: Linear, goal directed Affect: Full, appropriate to context, generally euthymic Interpersonal: Very pleasant, appropriate   TESTING: There is medical necessity to proceed with neuropsychological assessment as the results will be used to aid in differential diagnosis and clinical decision-making and to inform specific treatment recommendations. Per the patient, his daughter and medical records reviewed, there was an acute change in cognitive functioning and possible encephalopathy with some persisting symptoms.  Clinical Decision Making: In considering the patient's current level of functioning, level of presumed impairment, nature of symptoms, emotional and behavioral responses during the interview, level of literacy, and observed level of motivation, a battery of tests was selected and communicated to the psychometrician.   Following the clinical interview/neurobehavioral status exam, the patient completed this full battery of neuropsychological testing with my psychometrician under my supervision (see separate note).   PLAN: Evaluation ongoing; full report to follow as an addendum. The patient will return to see Dr. Posey Pronto next week, on 05/27/2017, at which time the results of this evaluation will be reviewed with the patient and his daughter.

## 2017-05-25 ENCOUNTER — Encounter: Payer: Self-pay | Admitting: Psychology

## 2017-05-25 NOTE — Progress Notes (Signed)
Full report is posted as an addendum to Dr. Corliss Blacker 05/20/2017 office visit note.

## 2017-05-25 NOTE — Progress Notes (Signed)
NEUROPSYCHOLOGICAL EVALUATION   Name:    Jonathon [name]  Date of Birth:   23-Jul-1935 Date of Interview:  05/20/2017 Date of Testing:  05/20/2017   Date of Report:  05/26/2017       Background Information:  Reason for Referral:  Jonathon [name] is a 82 y.o. male referred by Dr. Narda Amber to assess his current level of cognitive functioning and assist in differential diagnosis. The current evaluation consisted of a review of available medical records, an interview with the patient and his daughter, Jonathon [name], and the completion of a neuropsychological testing battery. Informed consent was obtained.  History of Presenting Problem:  [name] is followed by Dr. Posey Pronto for chronic inflammatory demyelinating polyneuropathy (CIDP). He had no prior history of cognitive decline/dementia, but was hospitalized 12/28/2016-01/02/2017 for altered mental status. He had been found confused and lying on the floor by his daughter on that date. There was no evidence of epileptiform discharges on EEG while hospitalized and CT of the head was unrevealing. He was diagnosed with metabolic encephalopathy. He continued to be confused and not himself after discharge home. Dr. Posey Pronto saw him on 01/08/2017 and noted speech changes/stuttering that was not present before. MoCA was 23/30. Labwork was completed, and vitamin B1 and B12 were found to be low. He has been getting B12 injections and taking oral supplement. EEG was completed on 01/16/2017 and was normal. He was referred for neurocognitive evaluation to further assess and delineate cognitive deficits.  Fortunately, since August 2018, the patient's condition has improved. He reports that it takes him longer to retrieve some words/information, and he still sometimes slurs his words when he speaks quickly, but otherwise he has not had cognitive concerns recently. His daughter agrees that his mental status/cognition has improved markedly since starting B12 injections. He is not forgetting  recent conversations/events, not misplacing items, no significant problems with attention/concentration. He does not get disoriented. He has some trouble keeping track of the day of the week but otherwise is well oriented to time. He still enjoys reading and watching certain television programs/sports. His son moved here to stay with him and provide support. He moved in a week ago. It has been very positive for both of them. Family helps with transportation (patient no longer driving), medication management (filling pillbox and reminding him if necessary), finances, and appointments. He is able to cook and prepare meals without any problems.   He has not had any falls since the hospitalization in August 2018. He is very careful and vigilant when he walks. He uses a Jonathon.  He has had some adjustment related depression due to coping with physical limitations and reduced independence but does not appear clinically depressed. Getting out of the house helps his mood a lot. His daughter is hoping to get a family membership to the [facility] so that they can go together and he can get out more.  He is no longer drinking alcohol daily as he had in the past. He only has one glass of wine at special family functions.   He denies any problems with sleep or appetite.   Social History: Born/Raised: [place] Education: High school graduate Occupational history: Served in [employer], academic for 4 years, no combat. Worked in a Civil engineer, contracting for many years, also worked for [employer] for many years. He worked part time for [employer] from 2004-2017. He is now retired. Marital history: Divorced. Ex-wife lives with his daughter Jonathon [name]. Patient has 4 children,  8 grandchildren and 3 great grandchildren.  Alcohol: Former daily drinker. Now minimal alcohol and controlled by family. Tobacco: Former smoker, quit 2012   Medical History:  Past Medical History:  Diagnosis Date  . Asthma   . BENIGN PROSTATIC HYPERTROPHY, HX  OF 10/25/2008  . BRADYCARDIA 2005  . Chronic diastolic congestive heart failure ([place]) 06/05/2016  . CIDP (chronic inflammatory demyelinating polyneuropathy) ([place])   . HYPERTENSION 11/21/2006  . Iron deficiency anemia, unspecified 04/19/2013  . 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 PACEMAKER, PERMANENT 2005   Gen change 2014 Medtronic Adaptic L dual-chamber pacemaker, serial [id number]   . SVT (supraventricular tachycardia) ([place])   . TOBACCO ABUSE 10/25/2008   Quit 2012    Current medications:  Outpatient Encounter Medications as of 05/20/2017  Medication Sig  . colchicine 0.6 MG tablet Day 1:Take 2 tablets (1.2mg ) by mouth.  Then take 1 tablet 1 hour later.  Day 2: take 1 tablet.  . cyanocobalamin (,VITAMIN B-12,) 1000 MCG/ML injection Inject 1 mL (1,000 mcg total) into the muscle every 30 (thirty) days.  . feeding supplement, ENSURE ENLIVE, (ENSURE ENLIVE) LIQD Take 237 mLs by mouth 2 (two) times daily between meals.  . flecainide (TAMBOCOR) 150 MG tablet Take 0.5 tablets (75 mg total) by mouth 2 (two) times daily.  . furosemide (LASIX) 20 MG tablet Take 1 tablet (20 mg total) by mouth daily.  . Garlic 6160 MG CAPS Take 2,000 mg by mouth daily as needed (supplement).   . irbesartan (AVAPRO) 300 MG tablet TAKE 1 TABLET(300 MG) BY MOUTH DAILY  . metoprolol succinate (TOPROL-XL) 25 MG 24 hr tablet TAKE 1 TABLET(25 MG) BY MOUTH DAILY  . NON FORMULARY as directed. B 12 infusion - once a week  . Omega-3 Fatty Acids (FISH OIL) 1000 MG CAPS Take 1 capsule by mouth daily as needed (for supplement).   . tamsulosin (FLOMAX) 0.4 MG CAPS capsule TAKE 1 CAPSULE BY MOUTH EVERY DAY  . Vitamin D, Ergocalciferol, (DRISDOL) 50000 units CAPS capsule Take 1 capsule (50,000 Units total) by mouth every 7 (seven) days.   No facility-administered encounter medications on file as of 05/20/2017.      Current Examination:  Behavioral Observations:  Appearance: Neatly, casually and  appropriately dressed and groomed Gait: Ambulated with a Jonathon, no gross abnormalities observed Speech: Fluent; generally normal rate, rhythm and volume. 1-2 occasions of brief slurred speech when trying to speak quickly. Thought process: Linear, goal directed Affect: Full, appropriate to context, generally euthymic Interpersonal: Very pleasant, appropriate Orientation: Oriented to all spheres. Accurately named the current President and his predecessor.    Tests Administered: . Test of Premorbid Functioning (TOPF) . Wechsler Adult Intelligence Scale-Fourth Edition (WAIS-IV): Similarities, Block Design, Matrix Reasoning, Coding and Digit Span subtests . Wechsler Memory Scale-Fourth Edition (WMS-IV) Older Adult Version (ages 35-90): Logical Memory I, II and Recognition subtests  . [employer] Verbal Learning Test - 2nd Edition (CVLT-2) Short Form . Repeatable Battery for the Assessment of Neuropsychological Status (RBANS) Form A:  Figure Copy and Recall subtests and Semantic Fluency subtest . Neuropsychological Assessment Battery (NAB) Language Module, Form 1: Naming subtest . [place] Diagnostic Aphasia Examination: Complex Ideational Material subtest . Controlled Oral Word Association Test (COWAT) . Trail Making Test A and B . Clock drawing test . Geriatric Depression Scale (GDS) 15 Item . Generalized Anxiety Disorder - 7 item screener (GAD-7)  Test Results: Note: Standardized scores are presented only for use by appropriately trained professionals and to allow  for any future test-retest comparison. These scores should not be interpreted without consideration of all the information that is contained in the rest of the report. The most recent standardization samples from the test publisher or other sources were used whenever possible to derive standard scores; scores were corrected for age, gender, ethnicity and education when available.   Test Scores:  Test Name Raw Score Standardized Score  Descriptor  TOPF 55/70 SS= 113 High average  WAIS-IV Subtests     Similarities 19/36 ss= 8 Average  Block Design 24/66 ss= 9 Average   Matrix Reasoning 7/26 ss= 8 Average  Coding 18/135 ss= 5 Borderline  Digit Span Forward 11/16 ss= 12 High average  Digit Span Backward 11/16 ss= 15 Superior  WMS-IV Subtests     LM I 41/53 ss= 15 Superior  LM II 22/39 ss= 12 High average  LM II Recognition 21/23 Cum %: >75 Above average  RBANS Subtests     Figure Copy 20/20 Z= 1.2 High average  Figure Recall 15/20 Z= 0.6 Average  Semantic Fluency 15 Z= -0.9 Low average  CVLT-II Scores     Trial 1 5/9 Z= 0 Average  Trial 4 8/9 Z= 0.5 Average  Trials 1-4 total 28/36 T= 61 High average  SD Free Recall 9/9 Z= 3.5 Very superior  LD Free Recall 8/9 Z= 2 Very superior  LD Cued Recall 9/9 Z= 2 Very superior  Recognition Discriminability 8/9 hits, 0 false positives Z= 1 High average  Forced Choice Recognition 9/9  WNL  NAB Language subtest     Naming 30/31 T= 59 High average  BDAE Subtest     Complex Ideational Material 12/12  WNL  COWAT-FAS 28 T= 44 Average  COWAT-Animals 10 T= 35 Borderline  Trail Making Test A  81" 0 errors T= 31 Borderline  Trail Making Test B  D/c'ed at 300" (maximum time reached at L) 2 errors   Severely impaired  Clock Drawing   Mildly impaired  GDS-15 5/15  Mild  GAD-7 7/21  Mild      Description of Test Results:  Premorbid verbal intellectual abilities were estimated to have been within the high average range based on a test of word reading. Psychomotor processing speed was borderline impaired. Auditory attention and working memory were high average to superior. Visual-spatial construction was average to high average. Language abilities were somewhat variable. Specifically, confrontation naming was high average, while semantic verbal fluency with a time limit was low average to borderline impaired. Auditory comprehension of complex ideational material was intact. With  regard to verbal memory, encoding and acquisition of non-contextual information (i.e., word list) was high average. After a brief distracter task, free recall was very superior (9/9 items recalled). After a delay, free recall was very superior (8/9 items recalled). Cued recall was very superior (9/9 items recalled). Performance on a yes/no recognition task was high average. On another verbal memory test, encoding and acquisition of contextual auditory information (i.e., short stories) was superior. After a delay, free recall was high average. Performance on a yes/no recognition task was above average. With regard to non-verbal memory, delayed free recall of visual information was average. On tasks measuring various aspects of executive functioning, performances ranged from average to impaired. Mental flexibility and set-shifting were severely impaired on Trails B. Verbal fluency with phonemic search restrictions was average. Verbal abstract reasoning was average. Non-verbal abstract reasoning was average. Performance on a clock drawing task was mildly impaired on his second attempt (hands accurately  indicated time, but spacing of numbers demonstrated poor planning and organization). On a self-report measures of mood, the patient's responses were indicative of mild depression and anxiety at the present time. Symptoms endorsed included: dropping interests/activities, boredom, fear of something bad happening, and not feeling happy most of the time, as well as mild generalized anxiety characterized by some nervousness, excessive worrying, difficulty relaxing, irritability and restlessness.   Clinical Impressions: Mild cognitive impairment; Adjustment disorder with mixed depressed/anxious mood. Results of cognitive testing revealed several above-average performances for age and indicated multiple areas of preserved cognitive function (commensurate with high average estimated premorbid baseline). Auditory memory and  auditory attention were two areas of relative strength, with performances ranging from high average to very superior for age. Auditory comprehension, visual memory, visual-spatial construction, confrontation naming, and abstract reasoning were all within normal limits for age. Meanwhile, there were a few areas of impairment, including processing speed, semantic verbal fluency, and mental flexibility/set-shifting.  The patient's performances on testing do not indicate a level of cognitive impairment great enough to be considered dementia. 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 His cognitive profile is indicative of mild frontal-subcortical dysfunction. It appears that he has mostly returned to cognitive baseline since experiencing a fall with altered mental status in August 2018, but he continues to struggle with slow processing speed and mild executive dysfunction. There is no sign of superimposed Alzheimer's disease at this time.  With regard to psychological functioning, he is not presenting with mood disorder or anxiety disorder, but he is experiencing adjustment related depressive/anxiety symptoms (secondary to chronic medical condition with associated declining physical abilities). Increased socialization will likely assist in maintaining highest quality of life possible.    Recommendations/Plan: --The patient and his daughter are scheduled to see Dr. Posey Pronto on 05/27/2017, and they will receive the results of this evaluation at that time. They will be reassured there are no signs of dementia and that in fact the patient has many areas of very strong cognitive function. His main persisting issue (based on testing performances) is reduced processing speed, so increasing time to complete tasks and take in new information will be helpful for him.  --To maximize cognitive functions, regular daily schedule/routine is also recommended, and this should include opportunities for  socialization and mental stimulation.  --They will continue to follow up with Dr. Posey Pronto for management of CIDP, and if there are concerns about cognitive changes at any point in the future we will be glad to provide neuropsychological re-evaluation.   Thank you for your referral of YUEPHENG SCHALLER. Please feel free to contact me if you have any questions or concerns regarding this report.   Total time spent on this patient's case: [id number] unit for neurobehavioral status exam with psychologist (60 minutes); [id number] and [phone] units for testing administered by psychometrician under psychologist's supervision and for scoring of tests by psychometrician; [phone] and [phone] units for integration of patient data, interpretation of standardized test results and clinical data, clinical decision making, treatment planning and preparation of this report by psychologist.

## 2017-05-27 ENCOUNTER — Ambulatory Visit (INDEPENDENT_AMBULATORY_CARE_PROVIDER_SITE_OTHER): Payer: Medicare Other | Admitting: Neurology

## 2017-05-27 ENCOUNTER — Encounter: Payer: Self-pay | Admitting: Neurology

## 2017-05-27 VITALS — BP 138/90 | HR 70 | Ht 70.0 in | Wt 179.5 lb

## 2017-05-27 DIAGNOSIS — G6181 Chronic inflammatory demyelinating polyneuritis: Secondary | ICD-10-CM

## 2017-05-27 DIAGNOSIS — E538 Deficiency of other specified B group vitamins: Secondary | ICD-10-CM

## 2017-05-27 DIAGNOSIS — G3184 Mild cognitive impairment, so stated: Secondary | ICD-10-CM | POA: Diagnosis not present

## 2017-05-27 DIAGNOSIS — E519 Thiamine deficiency, unspecified: Secondary | ICD-10-CM | POA: Diagnosis not present

## 2017-05-27 NOTE — Progress Notes (Signed)
[facility]  Follow-up Visit   Date: 05/27/17   Jonathon [name] MRN: [id number] DOB: June 08, 1935   Interim History: Jonathon [name] is a 82 y.o. year-old right-handed African American male with history of BPH, hypertension, bradycardia s/p pacemaker, and previous tobacco use presenting for follow-up of CIDP and memory changes.  He is here with his son and sister.    History of present illness: Patient was in his usual state of health until 2013. He has always been physical active and would go to the gym daily (walking up 10 miles/day). During the spring of 2013, he developed relatively subacute onset of left > right hamstring soreness, proximal leg weakness, and atrophy without an inciting event. Functionally, he is still able to complete all his usual activities, but has became much more cautious when climbing stairs.  In September 2014, he underwent NCS/EMG which did not show any myopathy or radiculopathy of the left lower extremity.  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  CK, aldolase, B12 was within normal limits. SPEP/UPEP shows monoclonal IgA kappa gammopathy and he underwent extensive hematologic testing, including bone marrow biopsy by Dr. Marin Olp which was nondiagnostic.    In 2015, he continued to have progressive loss of muscle bulk in the hands and proximal legs. He has difficulty with climbing stairs and opening jars, and dyspnea on exertion. 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   He reports previously drinking 4 beers/day and on the weekends up to 6 beers/day. 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  GM1 and anti-MAG antibody is negative. There is evidence of severe multilevel spinal stenosis of the cervical spine, and milder findings in the lumbar region.  CSF studies shows increase oligoclonal bands with normal cell count, IgG index, and  myelin basic proteins.  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   He completed 56-month of monthly IVMP and reports little benefit over the past few months, but no worsening.    He stopped working in spring of 2016 and going to the gym.  [facility] for syncopal spell secondary to orthostatic hypotension.  He has since been started on mestinon and is using compression stockings.  In late 2016, he was doing better ambulating independently, back at the gym, and gained weight, too.  Unfortunately, in 2017, his legs became much weaker and he suffered about 3 falls and had two fainting spells. Because of his progressive weakness, he was started on IVIG in October 2017, but only completed one dose.   UPDATE 12/09/2016:  Patient has not been seen in 9 months and self-discontinued IVIG in October 2017 because he did not feel that it was helping at the time, but now states that it may have been beneficial.  He has noticed progressive muscle atrophy of the legs.  He has one hospitalization for syncope and fall where he was on found by his daughter in the bathroom.  He has not suffered a fall in the past 6 months.  He uses a walker and cane at home, and wheelchair only for long distances.  His daughter mentions that his greatest difficulty is raising his legs to get into the shower.  He continues to drink (1) 20oz beer daily.  He has lost 5lb in the  past year and has been adding ensure to his diet daily.  He denies any new pain or paresthesias.  UPDATE 01/08/2017:  He was restarted on IVIG in early August 2018 which caused elevated blood pressure transiently.  He was doing well until 8/18 when he was found lying on the kitchen floor by family confused.  It was unclear whether he had a syncopal event or seizure.  He CKs were elevated to 6400.  EEG showed  delta slowing, no epileptiform discharges.  He has noticed increased confusion, stuttering and speech change over the past few months.  He lives by himself and since his discharge has a home health.  His daughter his concerned because he is alone at night and feels he needs 24-hr supervision, but they are unable to provide it. He was doing his own medications until his recent hospitalization and now has a home nurse.   Mood is fair and he is depressed with the fact that he is more dependent for care.   UPDATE 05/27/2017:  He is here for follow-up visit to discuss results of neuropsychological testing and routine follow-up.  There was no sign of dementia on his cognitive testing.  There is mild cognitive impairment and adjustment disorder.  He states that since his son has moved in with him, his mood is doing much better.  He quit drinking alcohol and has started to eat 2 meals daily.  In fact, he has gained 11lb since September.   At his last visit, he was started on vitamin B12 and B1 supplementation.  Since making these changes, he is no longer confused and family has seen a marked improvement in his overall cognition and health.  He has not suffered any falls and walks with a cane/walker as needed.  No new complaints.    Medications:  Current Outpatient Medications on File Prior to Visit  Medication Sig Dispense Refill  . colchicine 0.6 MG tablet Day 1:Take 2 tablets (1.57m) by mouth.  Then take 1 tablet 1 hour later.  Day 2: take 1 tablet. 32 tablet 0  . cyanocobalamin (,VITAMIN B-12,) 1000 MCG/ML injection Inject 1 mL (1,000 mcg total) into the muscle every 30 (thirty) days. 1 mL 12  . feeding supplement, ENSURE ENLIVE, (ENSURE ENLIVE) LIQD Take 237 mLs by mouth 2 (two) times daily between meals. 237 mL 12  . flecainide (TAMBOCOR) 150 MG tablet Take 0.5 tablets (75 mg total) by mouth 2 (two) times daily. 30 tablet 3  . furosemide (LASIX) 20 MG tablet Take 1 tablet (20 mg total) by mouth daily. 60  tablet 3  . Garlic 18657MG CAPS Take 2,000 mg by mouth daily as needed (supplement).     . irbesartan (AVAPRO) 300 MG tablet TAKE 1 TABLET(300 MG) BY MOUTH DAILY 90 tablet 2  . metoprolol succinate (TOPROL-XL) 25 MG 24 hr tablet TAKE 1 TABLET(25 MG) BY MOUTH DAILY 30 tablet 10  . NON FORMULARY as directed. B 12 infusion - once a week    . Omega-3 Fatty Acids (FISH OIL) 1000 MG CAPS Take 1 capsule by mouth daily as needed (for supplement).     . tamsulosin (FLOMAX) 0.4 MG CAPS capsule TAKE 1 CAPSULE BY MOUTH EVERY DAY 90 capsule 0  . Vitamin D, Ergocalciferol, (DRISDOL) 50000 units CAPS capsule Take 1 capsule (50,000 Units total) by mouth every 7 (seven) days. 12 capsule 0   No current facility-administered medications on file prior to visit.     Allergies: No Known Allergies  Review of Systems:  CONSTITUTIONAL: No fevers, chills, night sweats +weight gain EYES: No visual changes or eye pain ENT: No hearing changes.  No history of nose bleeds.   RESPIRATORY: No cough, wheezing and shortness of breath.   CARDIOVASCULAR: Negative for chest pain, and palpitations.   GI: Negative for abdominal discomfort, blood in stools or black stools.  No recent change in bowel habits.   GU:  No history of incontinence.   MUSCLOSKELETAL: No history of joint pain or swelling.  No myalgias.   SKIN: Negative for lesions, rash, and itching.   HEMATOLOGY/ONCOLOGY: Negative for prolonged bleeding, bruising easily, and swollen nodes.  ENDOCRINE: Negative for cold or heat intolerance, polydipsia or goiter.   PSYCH:  No depression or anxiety symptoms.   NEURO: As Above.   Vital Signs:  BP 138/90   Pulse 70   Ht '5\' 10"'  (1.778 m)   Wt 179 lb 8 oz (81.4 kg)   SpO2 97%   BMI 25.76 kg/m   General:  Well appearing, comfortable  Neurological Exam: MENTAL STATUS including orientation to time, place, person, recent and remote memory, attention span and concentration, language, and fund of knowledge is intact.   Speech is not dysarthric, there is no stuttering.  CRANIAL NERVES:  Pupils are round and reactive to light.  Normal conjugate, extra-ocular eye movements in all directions of gaze. Mild bilateral ptosis.  Face is symmetric.  Palate elevates symmetrically.    MOTOR:  Bilateral severe ADM, ABP and marked bilateral quadriceps and FDI atrophy.  No tremor or pronator drift.   Right Upper Extremity:    Left Upper Extremity:    Deltoid  5/5   Deltoid  5/5   Biceps  5/5   Biceps  5/5   Triceps  5/5   Triceps  5/5   Wrist extensors  5/5   Wrist extensors  5/5   Wrist flexors  5/5   Wrist flexors  5/5   Finger extensors  5/5   Finger extensors  5/5   Finger flexors  5/5   Finger flexors  5/5   Dorsal interossei  4/5   Dorsal interossei  4/5   Abductor pollicis  4+/5   Abductor pollicis  4+/5   Tone (Ashworth scale)  0  Tone (Ashworth scale)  0   Right Lower Extremity:    Left Lower Extremity:    Hip flexors  5/5   Hip flexors  5/5   Hip extensors  5/5   Hip extensors  5/5   Knee flexors  5/5   Knee flexors  5/5   Knee extensors  5/5   Knee extensors  5/5   Dorsiflexors  5/5   Dorsiflexors  5/5   Plantarflexors  5/5   Plantarflexors  5/5   Toe extensors  5/5   Toe extensors  5/5   Toe flexors  5/5   Toe flexors  5/5   Tone (Ashworth scale)  0  Tone (Ashworth scale)  0   MSRs:  Reflexes are 2+/4 in upper extremities, 2+ patella, and absent at Achilles.    COORDINATION/GAIT:  He is easily able to rise from chair without using arms.  Gait is mildly wide-based, appears stable and assisted with cane as needed.  Data: Labs 01/13/2013:  CK 55, aldolase 7.9, TSH 0.39, copper 97, ceruloplasmin 30 Labs 07/2013:  vitamin  B12 519, ANA neg, RF neg, ESR 5 Labs 08/10/2013:  CK 91, aldolase 14.9*, GM1 antibody - neg Labs 11/09/2013:  VEGF 118* (normal 31-86) Labs 06/02/2014:  VEGF <31 Labs 08/23/2014:  Vitamin B12 643, folate 18.6, CK 72 Labs 01/08/2017:  TSH 1.28, vitamin B12 238, CK 147  CSF 09/24/2013:   R1 W0 G59 Protein 42, IgG index 4.4, MBP < 2.0, WNV - neg, cytology - neg, OCB - 3 well defined bands present in CSF and serum, more prominent in CSF  EMG 01/25/2013:   1. 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  2. Left ulnar neuropathy at the elbow, moderately severe in degree electrically. A superimposed C8 motor radiculopathy cannot be excluded. 3. No evidence of diffuse motor axon loss or a generalized myopathy.  EMG 09/16/2013:  There is electrophysiological evidence of a chronic generalized sensorimotor polyradiculoneuropathy, axonal loss and demyelinating in type, affecting the left side. When compared to his previous EMG dated 01/25/2013, there is an interval worsening of findings.  CT cervical spine and lumbar spine 08/17/2013: 1. Advanced degenerative changes in the cervical spine, including multilevel bulky anterior endplate osteophytes.  2. Suspect multifactorial moderate to severe cervical spinal stenosis at the C5-C6 and C6-C7 levels. As the patient is not a candidate for MRI, cervical spine CT myelogram would confirm.  3. Associated severe multifactorial neural foraminal stenosis at the right C4, right C5, right C6, bilateral C7, and bilateral C8 nerve levels.  4. Mild for age lumbar spine degenerative changes, chiefly facet arthropathy. Up to mild multifactorial spinal stenosis at L2-L3 and L3-L4.   Labs showed monoclonal IgA kappa gammopathy on serum and saw Dr. Marin Olp, who also performed bone marrow biopsy which was negative.  He also has transaminatis likely due to fatty liver.  The possibility of POEMS was raised given elevated VEGF, but repeat VEGF from 05/2014 returned normal.  CT lumbar and cervical spine 03/27/2016:  Facet and disc degeneration throughout the lumbar spine. Mild spinal stenosis at L3-4 and L4-5 and L5-S1. No disc protrusion or foraminal encroachment.   Bilateral SI joint degenerative change. C3-4: Asymmetric RIGHT-sided facet arthropathy and uncinate spurring contributes to RIGHT C4 foraminal narrowing. C4-5: Marked anterior osteophytosis. Posterior disc protrusion. Asymmetric facet arthropathy on the RIGHT and asymmetric RIGHT-sided uncinate hypertrophy could result in RIGHT C5 foraminal narrowing. C5-6: Disc space narrowing. Marked anterior osteophytosis. Osseous spurring and facet arthropathy. RIGHT greater than LEFT C6 foraminal narrowing primarily due to uncinate hypertrophy. C6-7: Severe disc space narrowing. Marked anterior osteophytosis. Osseous ridging, facet arthropathy and BILATERAL uncinate spurring contribute to BILATERAL C7 foraminal narrowing. Superimposed soft disc protrusion is possible.  NCS/EMG of the left arm and leg 03/21/2016: 1. There is electrophysiologic evidence of a chronic, generalized sensorimotor polyradiculoneuropathy affecting the left side. When compared to his EMG dated 09/17/2013, there has been mild progression of disease. 2. There is also evidence of a superimposed left ulnar neuropathy across the elbow, demyelinating with secondary axon loss, and left median neuropathy at wrist (carpal tunnel syndrome). 3. Specifically, there is no evidence of a diffuse myopathy.  Labs 03/08/2016:  CK, aldolase 8.2, B12, B1 29, ACE 57, heavy metal screen neg, c-ANCA 1:40, p-ANCA neg  CT head and cervical spine 12/28/2016: Head CT:  No acute or traumatic finding.  Normal study for age.  Cervical spine CT: No acute or traumatic finding. Extensive chronic degenerative changes as outlined above. Routine EEG 02/05/2017:  Normal   IMPRESSION: 1. Chronic inflammatory demyelinating polyradiculoneuropathy.  Clinically, his exam continues to show hand atrophy and weakness, however his  hip flexion weakness has markedly improved.  He has stopped alcohol consumption and is being treated for vitamin B12 and B1 deficiency - the  combination of this is mostly likely helping, especially has he is no longer getting IVIG for the past year.  If he continues to show improvement or at least stabilization, it may be reasonable to conclude that his neuropathy was largely contributed by alcohol.  Restart IVIG infusions, if he has any neurological deterioration.  2.  Mild cognitive changes, likely due to vitamin B1 and B12 deficiency. After reviewing his records, his episode of confusion in the summer may have been due to Wernicke-Korsakoff encephalopathy, as labs indicated both vitamin B12 and thiamine deficiency.  Results of formal neuropsychological testing was reviewed.   Patient reassured that there are no signs of dementia on his cognitive testing. Continue vitamin B12 1026mg monthly and vitamin B1 1033mdaily.  I praised him for alcohol cessation and encouraged him to avoid this.   3.  Adjustment disorder.  Patient indicates his mood is doing much better now that his son is living with him and he has company.  He is eating much better and trying to stay active.  I will continue to follow this.    Return to clinic in 4 months  Greater than 50% of this 30 minute visit was spent in counseling, explanation of diagnosis, planning of further management, and coordination of care.   Thank you for allowing me to participate in patient's care.  If I can answer any additional questions, I would be pleased to do so.    Sincerely,    Tammy Ericsson K. PaPosey ProntoDO

## 2017-05-27 NOTE — Patient Instructions (Signed)
You look great today!  Start your home exercises   Continue your vitamin B12 injections every month and vitamin B1 100mg  daily  Return to clinic in 4 months

## 2017-05-30 ENCOUNTER — Ambulatory Visit: Payer: Medicare Other | Admitting: Neurology

## 2017-06-11 ENCOUNTER — Other Ambulatory Visit: Payer: Self-pay | Admitting: Internal Medicine

## 2017-07-23 ENCOUNTER — Other Ambulatory Visit: Payer: Self-pay | Admitting: Internal Medicine

## 2017-07-30 ENCOUNTER — Telehealth: Payer: Self-pay | Admitting: Neurology

## 2017-07-30 NOTE — Telephone Encounter (Signed)
Patient's daughter called and is very concerned for her dad's health. She said he has become very Combative, Delusional and very Agitated. She is unsure of who to call. Please Call. Thanks

## 2017-07-30 NOTE — Telephone Encounter (Signed)
I spoke with [name] and she said that her dad is acting very paranoid and demented.  She states that she can no longer care for him at home and is trying to get her brother to help out.  I instructed her to call his PCP to see if they would try to get him into an assisted living facility since he needs to constantly be watched now.  She agreed to call PCP.

## 2017-07-31 ENCOUNTER — Encounter: Payer: Self-pay | Admitting: Internal Medicine

## 2017-07-31 ENCOUNTER — Ambulatory Visit (INDEPENDENT_AMBULATORY_CARE_PROVIDER_SITE_OTHER): Payer: Medicare Other | Admitting: Internal Medicine

## 2017-07-31 VITALS — BP 180/90 | HR 81 | Temp 97.8°F | Wt 179.4 lb

## 2017-07-31 DIAGNOSIS — E538 Deficiency of other specified B group vitamins: Secondary | ICD-10-CM

## 2017-07-31 DIAGNOSIS — G6181 Chronic inflammatory demyelinating polyneuritis: Secondary | ICD-10-CM

## 2017-07-31 DIAGNOSIS — I5032 Chronic diastolic (congestive) heart failure: Secondary | ICD-10-CM

## 2017-07-31 DIAGNOSIS — I10 Essential (primary) hypertension: Secondary | ICD-10-CM | POA: Diagnosis not present

## 2017-07-31 DIAGNOSIS — G610 Guillain-Barre syndrome: Secondary | ICD-10-CM | POA: Diagnosis not present

## 2017-07-31 MED ORDER — ESCITALOPRAM OXALATE 5 MG PO TABS: 5.0000 mg | ORAL_TABLET | Freq: Every day | ORAL | 0 refills | Status: DC

## 2017-07-31 NOTE — Progress Notes (Signed)
Subjective:    Patient ID: Jonathon [name], male    DOB: 12/11/1935, 82 y.o.   MRN: [id number]  HPI  82 year old patient who is followed closely by neurology with a history of CIDP.  He had some extensive neuropsychiatric testing 2 months ago per neurology and was felt to have some very mild cognitive impairment with an adjustment disorder with mixed anxiety and depression He is accompanied by his daughter today.  He has had increasing anxiety and has been easily angered and frustrated.  He feels that he has become overly sensitive to a number of things including noise. There is a suggestion that at times he may have some visual hallucinations but the history is a bit inconsistent.  Past Medical History:  Diagnosis Date  . Asthma   . BENIGN PROSTATIC HYPERTROPHY, HX OF 10/25/2008  . BRADYCARDIA 2005  . Chronic diastolic congestive heart failure ([place]) 06/05/2016  . CIDP (chronic inflammatory demyelinating polyneuropathy) ([place])   . HYPERTENSION 11/21/2006  . Iron deficiency anemia, unspecified 04/19/2013  . 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 PACEMAKER, PERMANENT 2005   Gen change 2014 Medtronic Adaptic L dual-chamber pacemaker, serial [id number]   . SVT (supraventricular tachycardia) ([place])   . TOBACCO ABUSE 10/25/2008   Quit 2012     Social History   Socioeconomic History  . Marital status: Divorced    Spouse name: Not on file  . Number of children: 4  . Years of education: Not on file  . Highest education level: Not on file  Occupational History  . Occupation: ENVIROMENTAL Location manager: [employer]  . Occupation: retired  Scientific laboratory technician  . Financial resource strain: Not on file  . Food insecurity:    Worry: Not on file    Inability: Not on file  . Transportation needs:    Medical: Not on file    Non-medical: Not on file  Tobacco Use  . Smoking status: Former Smoker    Packs/day: 0.25    Years: 46.00    Pack years: 11.50   Types: Cigarettes    Start date: 03/16/1965    Last attempt to quit: 06/15/2010    Years since quitting: 7.1  . Smokeless tobacco: Never Used  . Tobacco comment: quit 3 years ago  Substance and Sexual Activity  . Alcohol use: No    Alcohol/week: 0.0 oz    Comment: 4 beers per day  . Drug use: No  . Sexual activity: Not on file  Lifestyle  . Physical activity:    Days per week: Not on file    Minutes per session: Not on file  . Stress: Not on file  Relationships  . Social connections:    Talks on phone: Not on file    Gets together: Not on file    Attends religious service: Not on file    Active member of club or organization: Not on file    Attends meetings of clubs or organizations: Not on file    Relationship status: Not on file  . Intimate partner violence:    Fear of current or ex partner: Not on file    Emotionally abused: Not on file    Physically abused: Not on file    Forced sexual activity: Not on file  Other Topics Concern  . Not on file  Social History Narrative   Has relocated from [place] in 2007. 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  Past Surgical History:  Procedure Laterality Date  . COLONOSCOPY  [id number]  . PACEMAKER INSERTION  2005  . PERMANENT PACEMAKER GENERATOR CHANGE N/A 06/19/2012   Procedure: PERMANENT PACEMAKER GENERATOR CHANGE;  Surgeon: [name], MD; Medtronic Adaptic L dual-chamber pacemaker, serial #[id number]    . SKIN GRAFT Right 1962   wrist    Family History  Problem Relation Age of Onset  . Heart attack Father        Died, 70  . Kidney disease Father   . Parkinson's disease Mother        Died, 83  . Breast cancer Sister        Living, 41  . Diabetes type II Brother        Living, 27  . Breast cancer Sister        Living, 27  . Diabetes Daughter        Living, 44  . Diabetes Son        Living, 75  . Ovarian cancer Daughter   . Colon cancer Neg Hx   . Rectal cancer Neg Hx   . Stomach cancer Neg Hx      No Known Allergies  Current Outpatient Medications on File Prior to Visit  Medication Sig Dispense Refill  . cyanocobalamin (,VITAMIN B-12,) 1000 MCG/ML injection Inject 1 mL (1,000 mcg total) into the muscle every 30 (thirty) days. 1 mL 12  . flecainide (TAMBOCOR) 150 MG tablet TAKE 1/2 TABLET BY MOUTH TWICE DAILY 90 tablet 1  . furosemide (LASIX) 20 MG tablet Take 1 tablet (20 mg total) by mouth daily. 60 tablet 3  . Garlic 6222 MG CAPS Take 2,000 mg by mouth daily as needed (supplement).     . irbesartan (AVAPRO) 300 MG tablet TAKE 1 TABLET(300 MG) BY MOUTH DAILY 90 tablet 2  . metoprolol succinate (TOPROL-XL) 25 MG 24 hr tablet TAKE 1 TABLET(25 MG) BY MOUTH DAILY 30 tablet 10  . NON FORMULARY as directed. B 12 infusion - once a week    . Omega-3 Fatty Acids (FISH OIL) 1000 MG CAPS Take 1 capsule by mouth daily as needed (for supplement).     . tamsulosin (FLOMAX) 0.4 MG CAPS capsule TAKE 1 CAPSULE BY MOUTH EVERY DAY 90 capsule 0  . Vitamin D, Ergocalciferol, (DRISDOL) 50000 units CAPS capsule Take 1 capsule (50,000 Units total) by mouth every 7 (seven) days. 12 capsule 0  . colchicine 0.6 MG tablet Day 1:Take 2 tablets (1.2mg ) by mouth.  Then take 1 tablet 1 hour later.  Day 2: take 1 tablet. (Patient not taking: Reported on 07/31/2017) 32 tablet 0  . feeding supplement, ENSURE ENLIVE, (ENSURE ENLIVE) LIQD Take 237 mLs by mouth 2 (two) times daily between meals. (Patient not taking: Reported on 07/31/2017) 237 mL 12   No current facility-administered medications on file prior to visit.     BP (!) 180/90 (BP Location: Right Arm, Patient Position: Sitting, Cuff Size: Large)   Pulse 81   Temp 97.8 F (36.6 C) (Oral)   Wt 179 lb 6.4 oz (81.4 kg)   SpO2 100%   BMI 25.74 kg/m     Review of Systems  Constitutional: Positive for fatigue. Negative for appetite change, chills and fever.  HENT: Negative for congestion, dental problem, ear pain, hearing loss, sore throat, tinnitus,  trouble swallowing and voice change.   Eyes: Negative for pain, discharge and visual disturbance.  Respiratory: Negative for cough, chest tightness, wheezing and stridor.   Cardiovascular:  Negative for chest pain, palpitations and leg swelling.  Gastrointestinal: Negative for abdominal distention, abdominal pain, blood in stool, constipation, diarrhea, nausea and vomiting.  Genitourinary: Negative for difficulty urinating, discharge, flank pain, genital sores, hematuria and urgency.  Musculoskeletal: Positive for gait problem. Negative for arthralgias, back pain, joint swelling, myalgias and neck stiffness.  Skin: Negative for rash.  Neurological: Negative for dizziness, syncope, speech difficulty, weakness, numbness and headaches.  Hematological: Negative for adenopathy. Does not bruise/bleed easily.  Psychiatric/Behavioral: Positive for agitation, behavioral problems and dysphoric mood. The patient is nervous/anxious.        Objective:   Physical Exam  Constitutional: He is oriented to person, place, and time. He appears well-developed.  HENT:  Head: Normocephalic.  Right Ear: External ear normal.  Left Ear: External ear normal.  Eyes: Conjunctivae and EOM are normal.  Neck: Normal range of motion.  Cardiovascular: Normal rate and normal heart sounds.  Pulmonary/Chest: Breath sounds normal.  Abdominal: Bowel sounds are normal.  Musculoskeletal: Normal range of motion. He exhibits no edema or tenderness.  Neurological: He is alert and oriented to person, place, and time.  Psychiatric: He has a normal mood and affect. His behavior is normal.  Alert and appropriate with normal affect          Assessment & Plan:   Adjustment disorder with mixed anxiety and depressed mood Mild cognitive impairment CIDP Hypertension.  Blood pressure a bit high today repeat 160/84 will reassess at next visit  Will place on Lexapro 5 to take every morning.  Follow-up 6 weeks Family will report  any clinical change Follow-up neurology  Nyoka Cowden

## 2017-07-31 NOTE — Patient Instructions (Signed)
Return in 6 weeks for follow-up  Limit your sodium (Salt) intake  Please check your blood pressure on a regular basis.  If it is consistently greater than 150/90, please make an office appointment.

## 2017-07-31 NOTE — Telephone Encounter (Signed)
Please send referral for home health nursing care for medication as well as social worker who can help them through this process and provide resources.  Did his behavior change suddenly?  Any new medications?  He may need evaluation for infection (UTI).  He may need a f/u with me to further assess this.

## 2017-08-01 ENCOUNTER — Emergency Department (HOSPITAL_COMMUNITY): Payer: Medicare Other

## 2017-08-01 ENCOUNTER — Inpatient Hospital Stay (HOSPITAL_COMMUNITY)
Admission: EM | Admit: 2017-08-01 | Discharge: 2017-08-05 | DRG: 101 | Disposition: A | Discharge status: Home Health | Payer: Medicare Other | Attending: Internal Medicine | Admitting: Internal Medicine

## 2017-08-01 ENCOUNTER — Telehealth: Payer: Self-pay | Admitting: Internal Medicine

## 2017-08-01 ENCOUNTER — Other Ambulatory Visit: Payer: Self-pay

## 2017-08-01 ENCOUNTER — Encounter (HOSPITAL_COMMUNITY): Payer: Self-pay | Admitting: Emergency Medicine

## 2017-08-01 DIAGNOSIS — R451 Restlessness and agitation: Secondary | ICD-10-CM | POA: Diagnosis present

## 2017-08-01 DIAGNOSIS — E519 Thiamine deficiency, unspecified: Secondary | ICD-10-CM | POA: Diagnosis not present

## 2017-08-01 DIAGNOSIS — F419 Anxiety disorder, unspecified: Secondary | ICD-10-CM | POA: Diagnosis present

## 2017-08-01 DIAGNOSIS — E162 Hypoglycemia, unspecified: Secondary | ICD-10-CM | POA: Diagnosis not present

## 2017-08-01 DIAGNOSIS — E538 Deficiency of other specified B group vitamins: Secondary | ICD-10-CM | POA: Diagnosis present

## 2017-08-01 DIAGNOSIS — F0390 Unspecified dementia without behavioral disturbance: Secondary | ICD-10-CM

## 2017-08-01 DIAGNOSIS — I5032 Chronic diastolic (congestive) heart failure: Secondary | ICD-10-CM | POA: Diagnosis not present

## 2017-08-01 DIAGNOSIS — I471 Supraventricular tachycardia, unspecified: Secondary | ICD-10-CM | POA: Diagnosis present

## 2017-08-01 DIAGNOSIS — G610 Guillain-Barre syndrome: Secondary | ICD-10-CM | POA: Diagnosis present

## 2017-08-01 DIAGNOSIS — R030 Elevated blood-pressure reading, without diagnosis of hypertension: Secondary | ICD-10-CM | POA: Diagnosis not present

## 2017-08-01 DIAGNOSIS — Z87891 Personal history of nicotine dependence: Secondary | ICD-10-CM

## 2017-08-01 DIAGNOSIS — M6281 Muscle weakness (generalized): Secondary | ICD-10-CM | POA: Diagnosis present

## 2017-08-01 DIAGNOSIS — R531 Weakness: Secondary | ICD-10-CM | POA: Diagnosis present

## 2017-08-01 DIAGNOSIS — I110 Hypertensive heart disease with heart failure: Secondary | ICD-10-CM | POA: Diagnosis present

## 2017-08-01 DIAGNOSIS — R404 Transient alteration of awareness: Secondary | ICD-10-CM | POA: Diagnosis not present

## 2017-08-01 DIAGNOSIS — F1020 Alcohol dependence, uncomplicated: Secondary | ICD-10-CM | POA: Diagnosis present

## 2017-08-01 DIAGNOSIS — G909 Disorder of the autonomic nervous system, unspecified: Secondary | ICD-10-CM | POA: Diagnosis present

## 2017-08-01 DIAGNOSIS — N400 Enlarged prostate without lower urinary tract symptoms: Secondary | ICD-10-CM | POA: Diagnosis present

## 2017-08-01 DIAGNOSIS — Z79899 Other long term (current) drug therapy: Secondary | ICD-10-CM

## 2017-08-01 DIAGNOSIS — R001 Bradycardia, unspecified: Secondary | ICD-10-CM | POA: Diagnosis present

## 2017-08-01 DIAGNOSIS — G6181 Chronic inflammatory demyelinating polyneuritis: Secondary | ICD-10-CM | POA: Diagnosis present

## 2017-08-01 DIAGNOSIS — D509 Iron deficiency anemia, unspecified: Secondary | ICD-10-CM | POA: Diagnosis present

## 2017-08-01 DIAGNOSIS — I951 Orthostatic hypotension: Secondary | ICD-10-CM | POA: Diagnosis present

## 2017-08-01 DIAGNOSIS — Z950 Presence of cardiac pacemaker: Secondary | ICD-10-CM | POA: Diagnosis present

## 2017-08-01 DIAGNOSIS — R5381 Other malaise: Secondary | ICD-10-CM | POA: Diagnosis present

## 2017-08-01 DIAGNOSIS — I10 Essential (primary) hypertension: Secondary | ICD-10-CM | POA: Diagnosis present

## 2017-08-01 DIAGNOSIS — G9340 Encephalopathy, unspecified: Secondary | ICD-10-CM | POA: Diagnosis present

## 2017-08-01 DIAGNOSIS — R569 Unspecified convulsions: Secondary | ICD-10-CM | POA: Diagnosis not present

## 2017-08-01 DIAGNOSIS — M109 Gout, unspecified: Secondary | ICD-10-CM | POA: Diagnosis present

## 2017-08-01 DIAGNOSIS — Z781 Physical restraint status: Secondary | ICD-10-CM

## 2017-08-01 DIAGNOSIS — K760 Fatty (change of) liver, not elsewhere classified: Secondary | ICD-10-CM | POA: Diagnosis present

## 2017-08-01 DIAGNOSIS — F329 Major depressive disorder, single episode, unspecified: Secondary | ICD-10-CM | POA: Diagnosis present

## 2017-08-01 DIAGNOSIS — J45909 Unspecified asthma, uncomplicated: Secondary | ICD-10-CM | POA: Diagnosis present

## 2017-08-01 DIAGNOSIS — R410 Disorientation, unspecified: Secondary | ICD-10-CM

## 2017-08-01 DIAGNOSIS — R4182 Altered mental status, unspecified: Secondary | ICD-10-CM | POA: Diagnosis not present

## 2017-08-01 LAB — CBC WITH DIFFERENTIAL/PLATELET
Basophils Absolute: 0.1 10*3/uL (ref 0.0–0.1)
Basophils Relative: 1 %
Eosinophils Absolute: 0.1 10*3/uL (ref 0.0–0.7)
Eosinophils Relative: 2 %
HEMATOCRIT: 37.8 % — AB (ref 39.0–52.0)
Hemoglobin: 12.8 g/dL — ABNORMAL LOW (ref 13.0–17.0)
LYMPHS PCT: 37 %
Lymphs Abs: 2.2 10*3/uL (ref 0.7–4.0)
MCH: 27.4 pg (ref 26.0–34.0)
MCHC: 33.9 g/dL (ref 30.0–36.0)
MCV: 80.8 fL (ref 78.0–100.0)
MONO ABS: 0.6 10*3/uL (ref 0.1–1.0)
MONOS PCT: 10 %
NEUTROS ABS: 3.1 10*3/uL (ref 1.7–7.7)
Neutrophils Relative %: 50 %
PLATELETS: 194 10*3/uL (ref 150–400)
RBC: 4.68 MIL/uL (ref 4.22–5.81)
RDW: 14.8 % (ref 11.5–15.5)
WBC: 6.1 10*3/uL (ref 4.0–10.5)

## 2017-08-01 LAB — URINALYSIS, ROUTINE W REFLEX MICROSCOPIC
BILIRUBIN URINE: NEGATIVE
GLUCOSE, UA: NEGATIVE mg/dL
KETONES UR: 5 mg/dL — AB
LEUKOCYTES UA: NEGATIVE
NITRITE: NEGATIVE
PH: 6 (ref 5.0–8.0)
Protein, ur: 30 mg/dL — AB
Specific Gravity, Urine: 1.01 (ref 1.005–1.030)
Squamous Epithelial / LPF: NONE SEEN

## 2017-08-01 LAB — COMPREHENSIVE METABOLIC PANEL
ALBUMIN: 3.8 g/dL (ref 3.5–5.0)
ALT: 36 U/L (ref 17–63)
AST: 54 U/L — AB (ref 15–41)
Alkaline Phosphatase: 96 U/L (ref 38–126)
Anion gap: 11 (ref 5–15)
BILIRUBIN TOTAL: 1.3 mg/dL — AB (ref 0.3–1.2)
BUN: 21 mg/dL — AB (ref 6–20)
CHLORIDE: 100 mmol/L — AB (ref 101–111)
CO2: 24 mmol/L (ref 22–32)
Calcium: 9.3 mg/dL (ref 8.9–10.3)
Creatinine, Ser: 1.05 mg/dL (ref 0.61–1.24)
GFR calc Af Amer: >60 mL/min (ref >60)
GFR calc non Af Amer: >60 mL/min (ref >60)
GLUCOSE: 92 mg/dL (ref 65–99)
POTASSIUM: 3.9 mmol/L (ref 3.5–5.1)
Sodium: 135 mmol/L (ref 135–145)
TOTAL PROTEIN: 7.7 g/dL (ref 6.5–8.1)

## 2017-08-01 LAB — AMMONIA: Ammonia: 19 umol/L (ref 9–35)

## 2017-08-01 LAB — CBG MONITORING, ED
GLUCOSE-CAPILLARY: 95 mg/dL (ref 65–99)
Glucose-Capillary: 97 mg/dL (ref 65–99)
Glucose-Capillary: 83 mg/dL (ref 65–99)

## 2017-08-01 LAB — ETHANOL: Alcohol, Ethyl (B): <10 mg/dL (ref <10)

## 2017-08-01 LAB — TROPONIN I
Troponin I: <0.03 ng/mL (ref <0.03)
Troponin I: 0.05 ng/mL (ref <0.03)

## 2017-08-01 MED ORDER — ALBUTEROL SULFATE (2.5 MG/3ML) 0.083% IN NEBU: 2.5000 mg | INHALATION_SOLUTION | RESPIRATORY_TRACT | Status: DC | PRN

## 2017-08-01 MED ORDER — SODIUM CHLORIDE 0.9% FLUSH: 3.0000 mL | INTRAVENOUS | Status: DC | PRN

## 2017-08-01 MED ORDER — METOPROLOL SUCCINATE ER 25 MG PO TB24
25.0000 mg | ORAL_TABLET | Freq: Every day | ORAL | Status: DC
  Administered 2017-08-01 – 2017-08-05 (×5): 25 mg via ORAL
  Filled 2017-08-01 (×5): qty 1

## 2017-08-01 MED ORDER — IRBESARTAN 75 MG PO TABS
75.0000 mg | ORAL_TABLET | Freq: Every day | ORAL | Status: DC
  Administered 2017-08-01 – 2017-08-03 (×3): 75 mg via ORAL
  Filled 2017-08-01 (×3): qty 1

## 2017-08-01 MED ORDER — VITAMIN B-12 1000 MCG PO TABS
1000.0000 ug | ORAL_TABLET | Freq: Every day | ORAL | Status: DC
  Administered 2017-08-02 – 2017-08-05 (×4): 1000 ug via ORAL
  Filled 2017-08-01 (×4): qty 1

## 2017-08-01 MED ORDER — SENNA 8.6 MG PO TABS
1.0000 | ORAL_TABLET | Freq: Two times a day (BID) | ORAL | Status: DC
  Administered 2017-08-01 – 2017-08-05 (×8): 8.6 mg via ORAL
  Filled 2017-08-01 (×8): qty 1

## 2017-08-01 MED ORDER — SODIUM CHLORIDE 0.9 % IV SOLN
INTRAVENOUS | Status: DC
  Administered 2017-08-01: 23:00:00 via INTRAVENOUS

## 2017-08-01 MED ORDER — ACETAMINOPHEN 325 MG PO TABS: 650.0000 mg | ORAL_TABLET | Freq: Four times a day (QID) | ORAL | Status: DC | PRN

## 2017-08-01 MED ORDER — VITAMIN D (ERGOCALCIFEROL) 1.25 MG (50000 UNIT) PO CAPS: 50000.0000 [IU] | ORAL_CAPSULE | ORAL | Status: DC

## 2017-08-01 MED ORDER — POLYETHYLENE GLYCOL 3350 17 G PO PACK: 17.0000 g | PACK | Freq: Every day | ORAL | Status: DC | PRN

## 2017-08-01 MED ORDER — SODIUM CHLORIDE 0.9 % IV SOLN: 250.0000 mL | INTRAVENOUS | Status: DC | PRN

## 2017-08-01 MED ORDER — ACETAMINOPHEN 650 MG RE SUPP: 650.0000 mg | Freq: Four times a day (QID) | RECTAL | Status: DC | PRN

## 2017-08-01 MED ORDER — ONDANSETRON HCL 4 MG PO TABS: 4.0000 mg | ORAL_TABLET | Freq: Four times a day (QID) | ORAL | Status: DC | PRN

## 2017-08-01 MED ORDER — FLECAINIDE ACETATE 50 MG PO TABS
75.0000 mg | ORAL_TABLET | Freq: Two times a day (BID) | ORAL | Status: DC
  Administered 2017-08-01 – 2017-08-05 (×8): 75 mg via ORAL
  Filled 2017-08-01 (×9): qty 2

## 2017-08-01 MED ORDER — HEPARIN SODIUM (PORCINE) 5000 UNIT/ML IJ SOLN
5000.0000 [IU] | Freq: Three times a day (TID) | INTRAMUSCULAR | Status: DC
  Administered 2017-08-01 – 2017-08-05 (×11): 5000 [IU] via SUBCUTANEOUS
  Filled 2017-08-01 (×11): qty 1

## 2017-08-01 MED ORDER — SODIUM CHLORIDE 0.9% FLUSH
3.0000 mL | Freq: Two times a day (BID) | INTRAVENOUS | Status: DC
  Administered 2017-08-01 – 2017-08-05 (×7): 3 mL via INTRAVENOUS

## 2017-08-01 MED ORDER — TAMSULOSIN HCL 0.4 MG PO CAPS
0.4000 mg | ORAL_CAPSULE | Freq: Every day | ORAL | Status: DC
  Administered 2017-08-01 – 2017-08-05 (×5): 0.4 mg via ORAL
  Filled 2017-08-01 (×5): qty 1

## 2017-08-01 MED ORDER — HYDRALAZINE HCL 20 MG/ML IJ SOLN
10.0000 mg | Freq: Four times a day (QID) | INTRAMUSCULAR | Status: DC | PRN
  Administered 2017-08-02 – 2017-08-04 (×5): 10 mg via INTRAVENOUS
  Filled 2017-08-01 (×6): qty 1

## 2017-08-01 MED ORDER — ONDANSETRON HCL 4 MG/2ML IJ SOLN: 4.0000 mg | Freq: Four times a day (QID) | INTRAMUSCULAR | Status: DC | PRN

## 2017-08-01 MED ORDER — TRAZODONE HCL 50 MG PO TABS: 50.0000 mg | ORAL_TABLET | Freq: Every evening | ORAL | Status: DC | PRN

## 2017-08-01 MED ORDER — ENSURE ENLIVE PO LIQD
237.0000 mL | Freq: Two times a day (BID) | ORAL | Status: DC
  Administered 2017-08-02 – 2017-08-05 (×6): 237 mL via ORAL

## 2017-08-01 MED ORDER — CYANOCOBALAMIN 1000 MCG/ML IJ SOLN: 1000.0000 ug | INTRAMUSCULAR | Status: DC

## 2017-08-01 NOTE — Progress Notes (Addendum)
[facility] ED CSW received phone call about pt. Pt's family is seeking placement. As of right now, pt does not have a 3 night qualifying stay therefore Medicare will not be able to cover SNF placement. CSW spoke with [facility] ED CM. [facility] ED CM is aware of pt and is reviewing pt's chart to see if pt is eligible for Home Health.   Plan: [facility] CSW will continue to follow for discharge needs.   Update: Family is willing to pay out of pocket for ALF. Family called [facility]. Nurse from [facility] is on her way to assess the pt. CSW created FL-2 and will fax to [facility]. CM working to establish home health with [facility].   3:35 CSW provided handoff to [facility] ED CSW.   Wendelyn Breslow, [facility]  [phone]

## 2017-08-01 NOTE — Progress Notes (Signed)
2nd shift ED CSW received handoff from 1st shift ED CSW stating, family is willing to private pay for pt to go to ALF and per 1st shift ED CSW's noites,  "Nurse from [facility] is on her way to assess the pt. CSW created FL-2 and will fax to [facility]. CM working to establish home health with [facility]".   Per CM, CM and nurse from [facility] confirmed pt was accepted to [facility] ALF, and then pt experienced a seizure in the ED.  Per nurse from [facility], pt will still be accepted whether pt is admitted into the hospital and D/C'd or the pt is D/C'd from the ED.  CSW will continue to follow for D/C needs.  Alphonse Guild. Renarda Mullinix, LCSW, LCAS, CSI Clinical Social Worker Ph: [phone]

## 2017-08-01 NOTE — Telephone Encounter (Unsigned)
Copied from [place] [phone]. Topic: Quick Communication - See Telephone Encounter >> Aug 01, 2017  3:45 PM Percell Belt A wrote: CRM for notification. See Telephone encounter for: 08/01/17. Kisa with [facility] [phone] They will be going to see pt on tues the 26th to start services

## 2017-08-01 NOTE — Progress Notes (Signed)
CRITICAL VALUE ALERT  Critical Value:  Troponin 0.05  Date & Time Notied:  08/01/17 2311  Provider Notified: Silas [place]  Orders Received/Actions taken: pending

## 2017-08-01 NOTE — H&P (Signed)
Patient Demographics:    Jonathon [name], is a 82 y.o. male  MRN: [id number]   DOB - May 24, 1935  Admit Date - 08/01/2017  Outpatient Primary MD for the patient is Marletta Lor, MD   Assessment & Plan:    Active Problems:   Seizure ([place])    1)Syncope Versus Seizures-  admit to telemetry monitored unit, watch for arrhythmias, check serial troponins and EKG for rule out ACS protocol, check echocardiogram to rule out significant aortic stenosis or other outflow obstruction, and also to evaluate EF and to rule out segmental/Regional wall motion abnormalities.  Will need to interrogate patient's pacemaker to see if any significant arrhythmia during this episode/event in the ED .  check carotid artery Dopplers to rule out hemodynamically significant stenosis, EEG for possible seizures, neurology consult requested, seizure precautions.  Brain MRI if pacemaker is MRI compatible  2)Dementia/Cognitive Decline/CIDP/chronic inflammatory demyelinating polyneuropathy-with neuromuscular compromise, generalized weakness and debility-patient is to go to skilled nursing facility as his needs are not being met at home, concerns about recurrent falls at home and inability to do ADLs independently at home.  Get PT OT eval  3)HTN-give metoprolol 25 mg daily, Avapro 75 mg daily,  may use IV Hydralazine 10 mg  Every 4 hours Prn for systolic blood pressure over 160 mmhg  4)H/o Cardiac Arrythmias-continue metoprolol 25 mg daily, continue flecainide 75 mg twice daily, monitor on telemetry unit, Patient needs pacemaker interrogation please call pacemaker team/Tech  5)Disposition- Transfer to [facility] With History of - Reviewed by me  Past Medical History:  Diagnosis Date  . Asthma   . BENIGN PROSTATIC HYPERTROPHY, HX OF 10/25/2008    . BRADYCARDIA 2005  . Chronic diastolic congestive heart failure ([place]) 06/05/2016  . CIDP (chronic inflammatory demyelinating polyneuropathy) ([place])   . HYPERTENSION 11/21/2006  . Iron deficiency anemia, unspecified 04/19/2013  . 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 PACEMAKER, PERMANENT 2005   Gen change 2014 Medtronic Adaptic L dual-chamber pacemaker, serial [id number]   . SVT (supraventricular tachycardia) ([place])   . TOBACCO ABUSE 10/25/2008   Quit 2012      Past Surgical History:  Procedure Laterality Date  . COLONOSCOPY  [id number]  . PACEMAKER INSERTION  2005  . PERMANENT PACEMAKER GENERATOR CHANGE N/A 06/19/2012   Procedure: PERMANENT PACEMAKER GENERATOR CHANGE;  Surgeon: [name], MD; Medtronic Adaptic L dual-chamber pacemaker, serial #[id number]    . SKIN GRAFT Right 1962   wrist      Chief Complaint  Patient presents with  . Altered Mental Status      HPI:    Jonathon [name]  is a 82 y.o. male with PMHx relevant for CIDP/chronic inflammatory demyelinating polyneuropathy, history of cardiac arrhythmias, status post prior pacemaker placement cognitive challenges presented to the ED with worsening generalized weakness and debility and was being assessed for possible transfer to long-term care facility when while in the ED had an  episode of unresponsiveness concerning for possible syncope versus seizure, apparently during this episode patient had tonic-clonic type movement concerning for possible seizures, but no incontinence or tongue bitting, ED provider discussed case with neurologist who advised observation overnight and further neurological workup including EEG. Patient needs pacemaker interrogation please call pacemaker team/Tech.  Prior to arrival to the ED EMS reportedly called for confusion although patient is somewhat unsure how this all happened.  Patient says he thinks that he just fell asleep.  Found to be hypoglycemic by EMS with sugars of  59.  Oral glucose improved to 80.   Patient apparently has been having increasing confusional episodes and disorientation confusion and forgetfulness additional history obtained from patient's daughter at bedside  CT head in the ED without acute findings, may not be able to get MRI as patient has a pacemaker    Review of systems:    In addition to the HPI above,   A full 12 point Review of 10 Systems was done, except as stated above, all other Review of 10 Systems were negative.    Social History:  Reviewed by me    Social History   Tobacco Use  . Smoking status: Former Smoker    Packs/day: 0.25    Years: 46.00    Pack years: 11.50    Types: Cigarettes    Start date: 03/16/1965    Last attempt to quit: 06/15/2010    Years since quitting: 7.1  . Smokeless tobacco: Never Used  . Tobacco comment: quit 3 years ago  Substance Use Topics  . Alcohol use: No    Alcohol/week: 0.0 oz    Comment: 4 beers per day       Family History :  Reviewed by me    Family History  Problem Relation Age of Onset  . Heart attack Father        Died, 57  . Kidney disease Father   . Parkinson's disease Mother        Died, 54  . Breast cancer Sister        Living, 58  . Diabetes type II Brother        Living, 45  . Breast cancer Sister        Living, 37  . Diabetes Daughter        Living, 103  . Diabetes Son        Living, 76  . Ovarian cancer Daughter   . Colon cancer Neg Hx   . Rectal cancer Neg Hx   . Stomach cancer Neg Hx      Home Medications:   Prior to Admission medications   Medication Sig Start Date End Date Taking? Authorizing Provider  cyanocobalamin (,VITAMIN B-12,) 1000 MCG/ML injection Inject 1 mL (1,000 mcg total) into the muscle every 30 (thirty) days. 01/28/17  Yes Marletta Lor, MD  escitalopram (LEXAPRO) 5 MG tablet Take 1 tablet (5 mg total) by mouth daily. 07/31/17  Yes Marletta Lor, MD  flecainide [facility]) 150 MG tablet TAKE 1/2 TABLET BY  MOUTH TWICE DAILY 07/23/17  Yes [name], MD  furosemide (LASIX) 20 MG tablet Take 1 tablet (20 mg total) by mouth daily. 04/30/17  Yes Marletta Lor, MD  Garlic 6226 MG CAPS Take 2,000 mg by mouth daily.    Yes [provider]  irbesartan (AVAPRO) 300 MG tablet TAKE 1 TABLET(300 MG) BY MOUTH DAILY 03/18/17  Yes [name], MD  metoprolol succinate (TOPROL-XL) 25 MG  24 hr tablet TAKE 1 TABLET(25 MG) BY MOUTH DAILY 03/03/17  Yes [name], MD  tamsulosin (FLOMAX) 0.4 MG CAPS capsule TAKE 1 CAPSULE BY MOUTH EVERY DAY 06/11/17  Yes Marletta Lor, MD  vitamin B-12 (CYANOCOBALAMIN) 1000 MCG tablet Take 1,000 mcg by mouth daily.   Yes [provider]  colchicine 0.6 MG tablet Day 1:Take 2 tablets (1.41m) by mouth.  Then take 1 tablet 1 hour later.  Day 2: take 1 tablet. Patient not taking: Reported on 07/31/2017 01/28/17   KMarletta Lor MD  feeding supplement, ENSURE ENLIVE, (ENSURE ENLIVE) LIQD Take 237 mLs by mouth 2 (two) times daily between meals. Patient not taking: Reported on 07/31/2017 06/07/16   XFlorencia Reasons MD  Vitamin D, Ergocalciferol, (DRISDOL) 50000 units CAPS capsule Take 1 capsule (50,000 Units total) by mouth every 7 (seven) days. Patient not taking: Reported on 08/01/2017 09/21/15   SLyndal Pulley DO     Allergies:    No Known Allergies   Physical Exam:   Vitals  Blood pressure (!) 151/81, pulse 81, temperature 98.9 F (37.2 C), temperature source Oral, resp. rate 18, SpO2 100 %.  Physical Examination: General appearance - alert, well appearing, and in no distress Mental status - alert, oriented to person, place, and time, some forgetfulness/cognitive challenges Eyes - sclera anicteric Neck - supple, no JVD elevation , Chest - clear  to auscultation bilaterally, symmetrical air movement,  Heart - S1 and S2 normal, pacemaker in situ Abdomen - soft, nontender, nondistended, no masses or organomegaly Neurological -   neck  supple without rigidity, h/o chronic inflammatory demyelinating polyneuropathy, gait is unsteady Extremities - no pedal edema noted, intact peripheral pulses  Skin - warm, dry   Data Review:    CBC Recent Labs  Lab 08/01/17 1058  WBC 6.1  HGB 12.8*  HCT 37.8*  PLT 194  MCV 80.8  MCH 27.4  MCHC 33.9  RDW 14.8  LYMPHSABS 2.2  MONOABS 0.6  EOSABS 0.1  BASOSABS 0.1   ------------------------------------------------------------------------------------------------------------------  Chemistries  Recent Labs  Lab 08/01/17 1058  NA 135  K 3.9  CL 100*  CO2 24  GLUCOSE 92  BUN 21*  CREATININE 1.05  CALCIUM 9.3  AST 54*  ALT 36  ALKPHOS 96  BILITOT 1.3*   ------------------------------------------------------------------------------------------------------------------ estimated creatinine clearance is 58.9 mL/min (by C-G formula based on SCr of 1.05 mg/dL). ------------------------------------------------------------------------------------------------------------------ No results for input(s): TSH, T4TOTAL, T3FREE, THYROIDAB in the last 72 hours.  Invalid input(s): FREET3  Coagulation profile No results for input(s): INR, PROTIME in the last 168 hours. ------------------------------------------------------------------------------------------------------------------- No results for input(s): DDIMER in the last 72 hours. -------------------------------------------------------------------------------------------------------------------  Cardiac Enzymes Recent Labs  Lab 08/01/17 1058  TROPONINI <0.03   ------------------------------------------------------------------------------------------------------------------    Component Value Date/Time   BNP 36.0 08/10/2014 1227   --------------------------------------------------------------------------------------------------------------  Urinalysis    Component Value Date/Time   COLORURINE YELLOW 08/01/2017 1119    APPEARANCEUR CLEAR 08/01/2017 1119   LABSPEC 1.010 08/01/2017 1119   PHURINE 6.0 08/01/2017 1119   GLUCOSEU NEGATIVE 08/01/2017 1119   HGBUR SMALL (A) 08/01/2017 1119   BILIRUBINUR NEGATIVE 08/01/2017 1119   KETONESUR 5 (A) 08/01/2017 1119   PROTEINUR 30 (A) 08/01/2017 1119   UROBILINOGEN 1.0 08/10/2014 1653   NITRITE NEGATIVE 08/01/2017 1119   LEUKOCYTESUR NEGATIVE 08/01/2017 1119    ----------------------------------------------------------------------------------------------------------------   Imaging Results:    Dg Chest 2 View  Result Date: 08/01/2017 CLINICAL DATA:  Altered mental status. EXAM: CHEST - 2  VIEW COMPARISON:  12/28/2016 FINDINGS: Dual lead cardiac pacemaker in stable position. Cardiomediastinal silhouette is normal. Mediastinal contours appear intact. Tortuosity and calcific atherosclerotic disease of the aorta. There is no evidence of focal airspace consolidation, pleural effusion or pneumothorax. Osseous structures are without acute abnormality. Soft tissues are grossly normal. IMPRESSION: No active cardiopulmonary disease. Electronically Signed   By: Fidela Salisbury M.D.   On: 08/01/2017 11:00   Ct Head Wo Contrast  Result Date: 08/01/2017 CLINICAL DATA:  Seizure.  Altered mental status. EXAM: CT HEAD WITHOUT CONTRAST TECHNIQUE: Contiguous axial images were obtained from the base of the skull through the vertex without intravenous contrast. COMPARISON:  12/28/2016 FINDINGS: Brain: Ventricles, cisterns and other CSF spaces are normal. There is mild chronic ischemic microvascular disease. There is no mass, mass effect, shift of midline structures or acute hemorrhage. There is no evidence of acute infarction. Vascular: No hyperdense vessel or unexpected calcification. Skull: Normal. Negative for fracture or focal lesion. Sinuses/Orbits: No acute finding. Other: None. IMPRESSION: No acute findings. Mild chronic ischemic microvascular disease. Electronically Signed   By:  Marin Olp M.D.   On: 08/01/2017 16:10    Radiological Exams on Admission: Dg Chest 2 View  Result Date: 08/01/2017 CLINICAL DATA:  Altered mental status. EXAM: CHEST - 2 VIEW COMPARISON:  12/28/2016 FINDINGS: Dual lead cardiac pacemaker in stable position. Cardiomediastinal silhouette is normal. Mediastinal contours appear intact. Tortuosity and calcific atherosclerotic disease of the aorta. There is no evidence of focal airspace consolidation, pleural effusion or pneumothorax. Osseous structures are without acute abnormality. Soft tissues are grossly normal. IMPRESSION: No active cardiopulmonary disease. Electronically Signed   By: Fidela Salisbury M.D.   On: 08/01/2017 11:00   Ct Head Wo Contrast  Result Date: 08/01/2017 CLINICAL DATA:  Seizure.  Altered mental status. EXAM: CT HEAD WITHOUT CONTRAST TECHNIQUE: Contiguous axial images were obtained from the base of the skull through the vertex without intravenous contrast. COMPARISON:  12/28/2016 FINDINGS: Brain: Ventricles, cisterns and other CSF spaces are normal. There is mild chronic ischemic microvascular disease. There is no mass, mass effect, shift of midline structures or acute hemorrhage. There is no evidence of acute infarction. Vascular: No hyperdense vessel or unexpected calcification. Skull: Normal. Negative for fracture or focal lesion. Sinuses/Orbits: No acute finding. Other: None. IMPRESSION: No acute findings. Mild chronic ischemic microvascular disease. Electronically Signed   By: Marin Olp M.D.   On: 08/01/2017 16:10    DVT Prophylaxis -SCD /heparin AM Labs Ordered, also please review Full Orders  Family Communication: Admission, patients condition and plan of care including tests being ordered have been discussed with the patient and daughter /POA who indicate understanding and agree with the plan   Code Status - Full Code  Likely DC to  home  Condition   Stable  Roxan Hockey M.D on 08/01/2017 at 8:12  PM  Between 7am to 7pm - Pager - [phone] After 7pm go to [email or web address] - password [email or web address]  [facility] - Office  [phone]  Voice Recognition Viviann Spare dictation system was used to create this note, attempts have been made to correct errors. Please contact the author with questions and/or clarifications.

## 2017-08-01 NOTE — Telephone Encounter (Signed)
Copied from [place]. Topic: Inquiry >> Aug 01, 2017  8:32 AM Margot Ables wrote: Reason for CRM: pt daughter states EMS at pts home now. His cognition is off, yesterday BP was high at his OV, EMS states blood sugar is low this morning. Pt called [name] at 3:47am, she had a family member go to pts home, home was 60 degrees with AC on and he said the lady told him to stay there (in his chair). [name] states she was trying to explain yesterday at pt appt that pt is seeing visions of things that are not there. Pt is going to the hospital. [name] was sobbing stating that he can't be home alone anymore. He is a proud man and she knows he wants to. She is asking for call back today to advise on possible home health or temporary facility. Please advise

## 2017-08-01 NOTE — ED Triage Notes (Signed)
Per EMS, 59 was initial CBG. When EMS arrived he was talking normally and then seemed to be altered briefly after that. Received oral glucose and is now at 80 CBG and AOx4 208/104 92 18. Has psych hx.

## 2017-08-01 NOTE — ED Provider Notes (Signed)
Blood pressure (!) 189/114, pulse 81, temperature 98.5 F (36.9 C), temperature source Oral, resp. rate (!) 22, SpO2 98 %.  Assuming care from Dr. Alvino Chapel.  In short, Jonathon [name] is a 82 y.o. male with a chief complaint of Altered Mental Status .  Refer to the original H&P for additional details.  The current plan of care is to follow CT and likely admit with non-hypoglycemic seizure in the ED with worsening AMS.  04:43 PM Spoke with Dr. Malen Gauze with neurology who reviewed the CT imaging and labs.  No indication for lumbar puncture.  No concern for infectious etiology.  Patient's mental status is improving after his seizure.  If he returns to mental status baseline he would be okay to go home with outpatient neurology follow-up.  If not, the patient will require MRI and EEG.   Discussed patient's case with Hospitalist to request admission. Patient and family (if present) updated with plan. Care transferred to Hospitalist service.  I reviewed all nursing notes, vitals, pertinent old records, EKGs, labs, imaging (as available).   EKG Interpretation  Date/Time:  Friday August 01 2017 10:27:35 EDT Ventricular Rate:  89 PR Interval:    QRS Duration: 165 QT Interval:  418 QTC Calculation: 509 R Axis:   -143 Text Interpretation:  Ventricular-paced complexes No further analysis attempted due to paced rhythm Confirmed by [name] [phone]) on 08/01/2017 11:54:46 AM      Nanda Quinton, MD    Long, [facility], MD 08/01/17 [phone]

## 2017-08-01 NOTE — ED Notes (Signed)
Bed: WA02 Expected date:  Expected time:  Means of arrival:  Comments: EMS-hypoglycemic

## 2017-08-01 NOTE — Telephone Encounter (Signed)
Spoke to [place] and informed her to speak with ED for discharge planning. She stated that she was thinking the same thing but she wanted to give Dr.Kwiatkowski updated information about the patient.

## 2017-08-01 NOTE — ED Provider Notes (Addendum)
[name] Provider Note   CSN: [id number] Arrival date & time: 08/01/17  0910     History   Chief Complaint Chief Complaint  Patient presents with  . Altered Mental Status    HPI Jonathon [name] is a 82 y.o. male. Level 5 caveat due to altered mental status. HPI Patient presents with altered mental status.  EMS reportedly called for confusion although patient is somewhat unsure how this all happened.  Patient says he thinks that he just fell asleep.  Found to be hypoglycemic by EMS with sugars of 59.  Oral glucose improved to 80.  Seen by primary care doctor yesterday.  Has reportedly had some increasing confusion.  Reviewing the doctor's notes from yesterday there was some talk about placement. Past Medical History:  Diagnosis Date  . Asthma   . BENIGN PROSTATIC HYPERTROPHY, HX OF 10/25/2008  . BRADYCARDIA 2005  . Chronic diastolic congestive heart failure ([place]) 06/05/2016  . CIDP (chronic inflammatory demyelinating polyneuropathy) ([place])   . HYPERTENSION 11/21/2006  . Iron deficiency anemia, unspecified 04/19/2013  . 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 PACEMAKER, PERMANENT 2005   Gen change 2014 Medtronic Adaptic L dual-chamber pacemaker, serial [id number]   . SVT (supraventricular tachycardia) ([place])   . TOBACCO ABUSE 10/25/2008   Quit 2012    Patient Active Problem List   Diagnosis Date Noted  . Thiamine deficiency 05/27/2017  . B12 deficiency 04/30/2017  . Alcoholism ([place]) 01/28/2017  . Acute encephalopathy 12/28/2016  . Hepatic steatosis 06/05/2016  . Physical deconditioning 06/05/2016  . Chronic diastolic congestive heart failure ([place]) 06/05/2016  . Orthostatic syncope   . Elevated liver function tests   . LOC (loss of consciousness) ([place]) 08/10/2014  . Syncope 08/10/2014  . CIDP (chronic inflammatory demyelinating polyneuropathy) ([place]) 08/08/2014  . Sustained SVT ([place]) 07/27/2014  .  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 Abnormal LFTs 08/06/2013  . Iron deficiency anemia 04/19/2013  . Muscle weakness 01/13/2013  . External hemorrhoids 03/23/2011  . Symptomatic bradycardia 10/25/2008  . 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 BENIGN PROSTATIC HYPERTROPHY, HX OF 10/25/2008  . PACEMAKER, PERMANENT 10/25/2008  . Essential hypertension 11/21/2006  . BENIGN PROSTATIC HYPERTROPHY 11/21/2006  . History of colonic polyps 11/21/2006  . NEPHROLITHIASIS, HX OF 11/21/2006    Past Surgical History:  Procedure Laterality Date  . COLONOSCOPY  [id number]  . PACEMAKER INSERTION  2005  . PERMANENT PACEMAKER GENERATOR CHANGE N/A 06/19/2012   Procedure: PERMANENT PACEMAKER GENERATOR CHANGE;  Surgeon: [name], MD; Medtronic Adaptic L dual-chamber pacemaker, serial #[id number]    . SKIN GRAFT Right 1962   wrist       Home Medications    Prior to Admission medications   Medication Sig Start Date End Date Taking? Authorizing Provider  cyanocobalamin (,VITAMIN B-12,) 1000 MCG/ML injection Inject 1 mL (1,000 mcg total) into the muscle every 30 (thirty) days. 01/28/17  Yes Marletta Lor, MD  escitalopram (LEXAPRO) 5 MG tablet Take 1 tablet (5 mg total) by mouth daily. 07/31/17  Yes Marletta Lor, MD  flecainide [facility]) 150 MG tablet TAKE 1/2 TABLET BY MOUTH TWICE DAILY 07/23/17  Yes [name], MD  furosemide (LASIX) 20 MG tablet Take 1 tablet (20 mg total) by mouth daily. 04/30/17  Yes Marletta Lor, MD  Garlic 5465 MG CAPS Take 2,000 mg by mouth daily.    Yes [provider]  irbesartan (AVAPRO) 300 MG tablet TAKE 1 TABLET(300 MG) BY MOUTH DAILY  03/18/17  Yes [name], MD  metoprolol succinate (TOPROL-XL) 25 MG 24 hr tablet TAKE 1 TABLET(25 MG) BY MOUTH DAILY 03/03/17  Yes [name], MD  tamsulosin (FLOMAX) 0.4 MG CAPS capsule TAKE 1 CAPSULE BY MOUTH EVERY DAY 06/11/17  Yes Marletta Lor, MD  vitamin B-12 (CYANOCOBALAMIN) 1000 MCG  tablet Take 1,000 mcg by mouth daily.   Yes [provider]  colchicine 0.6 MG tablet Day 1:Take 2 tablets (1.2mg ) by mouth.  Then take 1 tablet 1 hour later.  Day 2: take 1 tablet. Patient not taking: Reported on 07/31/2017 01/28/17   Marletta Lor, MD  feeding supplement, ENSURE ENLIVE, (ENSURE ENLIVE) LIQD Take 237 mLs by mouth 2 (two) times daily between meals. Patient not taking: Reported on 07/31/2017 06/07/16   Florencia Reasons, MD  Vitamin D, Ergocalciferol, (DRISDOL) 50000 units CAPS capsule Take 1 capsule (50,000 Units total) by mouth every 7 (seven) days. Patient not taking: Reported on 08/01/2017 09/21/15   Lyndal Pulley, DO    Family History Family History  Problem Relation Age of Onset  . Heart attack Father        Died, 55  . Kidney disease Father   . Parkinson's disease Mother        Died, 61  . Breast cancer Sister        Living, 76  . Diabetes type II Brother        Living, 35  . Breast cancer Sister        Living, 32  . Diabetes Daughter        Living, 4  . Diabetes Son        Living, 68  . Ovarian cancer Daughter   . Colon cancer Neg Hx   . Rectal cancer Neg Hx   . Stomach cancer Neg Hx     Social History Social History   Tobacco Use  . Smoking status: Former Smoker    Packs/day: 0.25    Years: 46.00    Pack years: 11.50    Types: Cigarettes    Start date: 03/16/1965    Last attempt to quit: 06/15/2010    Years since quitting: 7.1  . Smokeless tobacco: Never Used  . Tobacco comment: quit 3 years ago  Substance Use Topics  . Alcohol use: No    Alcohol/week: 0.0 oz    Comment: 4 beers per day  . Drug use: No     Allergies   Patient has no known allergies.   Review of Systems Review of Systems  Constitutional: Negative for appetite change.  HENT: Negative for congestion.   Respiratory: Negative for chest tightness.   Gastrointestinal: Negative for abdominal pain.  Genitourinary: Negative for flank pain.  Skin: Negative for pallor.    Psychiatric/Behavioral: Positive for confusion.     Physical Exam Updated Vital Signs BP (!) 189/114   Pulse 81   Temp 98.5 F (36.9 C) (Oral)   Resp (!) 22   SpO2 98%   Physical Exam  Constitutional: He appears well-developed.  HENT:  Head: Atraumatic.  Neck: Neck supple.  Cardiovascular: Normal rate.  Pulmonary/Chest: Effort normal.  Abdominal: There is no tenderness.  Neurological: He is alert.  Patient is awake and pleasant but some confusion to date and events.  Skin: Skin is warm. Capillary refill takes less than 2 seconds.     ED Treatments / Results  Labs (all labs ordered are listed, but only abnormal results are displayed) Labs Reviewed  COMPREHENSIVE METABOLIC PANEL - Abnormal; Notable for the following components:      Result Value   Chloride 100 (*)    BUN 21 (*)    AST 54 (*)    Total Bilirubin 1.3 (*)    All other components within normal limits  CBC WITH DIFFERENTIAL/PLATELET - Abnormal; Notable for the following components:   Hemoglobin 12.8 (*)    HCT 37.8 (*)    All other components within normal limits  URINALYSIS, ROUTINE W REFLEX MICROSCOPIC - Abnormal; Notable for the following components:   Hgb urine dipstick SMALL (*)    Ketones, ur 5 (*)    Protein, ur 30 (*)    Bacteria, UA RARE (*)    All other components within normal limits  TROPONIN I  AMMONIA  ETHANOL  CBG MONITORING, ED  CBG MONITORING, ED    EKG  EKG Interpretation None       EKG Interpretation  Date/Time:  Friday August 01 2017 10:27:35 EDT Ventricular Rate:  89 PR Interval:    QRS Duration: 165 QT Interval:  418 QTC Calculation: 509 R Axis:   -143 Text Interpretation:  Ventricular-paced complexes No further analysis attempted due to paced rhythm Confirmed by [name] [phone]) on 08/01/2017 11:54:46 AM        Radiology Dg Chest 2 View  Result Date: 08/01/2017 CLINICAL DATA:  Altered mental status. EXAM: CHEST - 2 VIEW COMPARISON:  12/28/2016  FINDINGS: Dual lead cardiac pacemaker in stable position. Cardiomediastinal silhouette is normal. Mediastinal contours appear intact. Tortuosity and calcific atherosclerotic disease of the aorta. There is no evidence of focal airspace consolidation, pleural effusion or pneumothorax. Osseous structures are without acute abnormality. Soft tissues are grossly normal. IMPRESSION: No active cardiopulmonary disease. Electronically Signed   By: Fidela Salisbury M.D.   On: 08/01/2017 11:00   Ct Head Wo Contrast  Result Date: 08/01/2017 CLINICAL DATA:  Seizure.  Altered mental status. EXAM: CT HEAD WITHOUT CONTRAST TECHNIQUE: Contiguous axial images were obtained from the base of the skull through the vertex without intravenous contrast. COMPARISON:  12/28/2016 FINDINGS: Brain: Ventricles, cisterns and other CSF spaces are normal. There is mild chronic ischemic microvascular disease. There is no mass, mass effect, shift of midline structures or acute hemorrhage. There is no evidence of acute infarction. Vascular: No hyperdense vessel or unexpected calcification. Skull: Normal. Negative for fracture or focal lesion. Sinuses/Orbits: No acute finding. Other: None. IMPRESSION: No acute findings. Mild chronic ischemic microvascular disease. Electronically Signed   By: Marin Olp M.D.   On: 08/01/2017 16:10    Procedures Procedures (including critical care time)  Medications Ordered in ED Medications - No data to display   Initial Impression / Assessment and Plan / ED Course  I have reviewed the triage vital signs and the nursing notes.  Pertinent labs & imaging results that were available during my care of the patient were reviewed by me and considered in my medical decision making (see chart for details).     Patient with mental status changes.  Has had some confusion gradually over the last months.  Mild hypoglycemia that is resolved here.  Has had some hypertension.  CBG stable.  Patient does not  have a clear reason for admission at this time.  Patient has had a home health consult and has been able to be placed at the higher level of care.  Will discharge home.  Before patient was able to be discharged home he had a witnessed tonic-clonic seizure  in the ER.  This was a first seizure for him.  Mental status now improving with reexamination.  Reportedly had been looked for seizures in the past.  CBG during the episode was 95.  With episodic mental status changes and new seizure today I feels that patient would now benefit from admission.  Will admit to hospitalist  Final Clinical Impressions(s) / ED Diagnoses   Final diagnoses:  Confusion  Hypoglycemia  Seizure [facility])    ED Discharge Orders        [place]     08/01/17 1444    Face-to-face encounter (required for Medicare/Medicaid patients)    Comments:  I [name] certify that this patient is under my care and that I, or a nurse practitioner or physician's assistant working with me, had a face-to-face encounter that meets the physician face-to-face encounter requirements with this patient on 08/01/2017. The encounter with the patient was in whole, or in part for the following medical condition(s) which is the primary reason for home health care (List medical condition):  Confusion  Further orders from Dr Burnice Logan.   08/01/17 Purdy, Jezebel Pollet, MD 08/01/17 [place]    [name], MD 08/01/17 1616

## 2017-08-01 NOTE — Discharge Instructions (Signed)
Try to keep your oral intake up.

## 2017-08-01 NOTE — NC FL2 (Signed)
[place] LEVEL OF CARE SCREENING TOOL     IDENTIFICATION  Patient Name: Jonathon [name] Birthdate: 1935-06-03 Sex: male Admission Date (Current Location): 08/01/2017  [facility] and [place] Number:  Herbalist and Address:  [facility], [place] [address] [place], [place] [address]      Provider Number: [id number]  Attending Physician Name and Address:  [name], MD  Relative Name and Phone Number:       Current Level of Care: Home Recommended Level of Care: [place] Prior Approval Number:   [id number] A Date Approved/Denied:   PASRR Number:    Discharge Plan: Other (Comment)(ALF)    Current Diagnoses: Patient Active Problem List   Diagnosis Date Noted  . Thiamine deficiency 05/27/2017  . B12 deficiency 04/30/2017  . Alcoholism ([place]) 01/28/2017  . Acute encephalopathy 12/28/2016  . Hepatic steatosis 06/05/2016  . Physical deconditioning 06/05/2016  . Chronic diastolic congestive heart failure ([place]) 06/05/2016  . Orthostatic syncope   . Elevated liver function tests   . LOC (loss of consciousness) ([place]) 08/10/2014  . Syncope 08/10/2014  . CIDP (chronic inflammatory demyelinating polyneuropathy) ([place]) 08/08/2014  . Sustained SVT ([place]) 07/27/2014  . 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 Abnormal LFTs 08/06/2013  . Iron deficiency anemia 04/19/2013  . Muscle weakness 01/13/2013  . External hemorrhoids 03/23/2011  . Symptomatic bradycardia 10/25/2008  . 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 BENIGN PROSTATIC HYPERTROPHY, HX OF 10/25/2008  . PACEMAKER, PERMANENT 10/25/2008  . Essential hypertension 11/21/2006  . BENIGN PROSTATIC HYPERTROPHY 11/21/2006  . History of colonic polyps 11/21/2006  . NEPHROLITHIASIS, HX OF 11/21/2006    Orientation RESPIRATION BLADDER Height & Weight     Self, Time, Situation, Place  Normal Continent Weight:   Height:     BEHAVIORAL SYMPTOMS/MOOD NEUROLOGICAL BOWEL NUTRITION  STATUS      Continent (See AVS)  AMBULATORY STATUS COMMUNICATION OF NEEDS Skin   Independent([place])   Normal                       Personal Care Assistance Level of Assistance  Bathing, Feeding, Dressing Bathing Assistance: Limited assistance Feeding assistance: Limited assistance Dressing Assistance: Limited assistance     Functional Limitations Info             SPECIAL CARE FACTORS FREQUENCY                       Contractures      Additional Factors Info  Code Status, Allergies Code Status Info: Prior Allergies Info: No known allergies            Current Medications (08/01/2017):  This is the current hospital active medication list No current facility-administered medications for this encounter.    Current Outpatient Medications  Medication Sig Dispense Refill  . cyanocobalamin (,VITAMIN B-12,) 1000 MCG/ML injection Inject 1 mL (1,000 mcg total) into the muscle every 30 (thirty) days. 1 mL 12  . escitalopram (LEXAPRO) 5 MG tablet Take 1 tablet (5 mg total) by mouth daily. 90 tablet 0  . flecainide (TAMBOCOR) 150 MG tablet TAKE 1/2 TABLET BY MOUTH TWICE DAILY 90 tablet 1  . furosemide (LASIX) 20 MG tablet Take 1 tablet (20 mg total) by mouth daily. 60 tablet 3  . Garlic 3474 MG CAPS Take 2,000 mg by mouth daily.     . irbesartan (AVAPRO) 300 MG tablet TAKE 1 TABLET(300 MG) BY MOUTH DAILY 90 tablet 2  .  metoprolol succinate (TOPROL-XL) 25 MG 24 hr tablet TAKE 1 TABLET(25 MG) BY MOUTH DAILY 30 tablet 10  . tamsulosin (FLOMAX) 0.4 MG CAPS capsule TAKE 1 CAPSULE BY MOUTH EVERY DAY 90 capsule 0  . vitamin B-12 (CYANOCOBALAMIN) 1000 MCG tablet Take 1,000 mcg by mouth daily.    . colchicine 0.6 MG tablet Day 1:Take 2 tablets (1.2mg ) by mouth.  Then take 1 tablet 1 hour later.  Day 2: take 1 tablet. (Patient not taking: Reported on 07/31/2017) 32 tablet 0  . feeding supplement, ENSURE ENLIVE, (ENSURE ENLIVE) LIQD Take 237 mLs by mouth 2 (two) times daily between  meals. (Patient not taking: Reported on 07/31/2017) 237 mL 12  . Vitamin D, Ergocalciferol, (DRISDOL) 50000 units CAPS capsule Take 1 capsule (50,000 Units total) by mouth every 7 (seven) days. (Patient not taking: Reported on 08/01/2017) 12 capsule 0     Discharge Medications: Please see discharge summary for a list of discharge medications.  Relevant Imaging Results:  Relevant Lab Results:   Additional Information [id number]  Wendelyn Breslow, LCSW

## 2017-08-01 NOTE — Telephone Encounter (Signed)
Patient is current in ED. Recommend family talk with ED social worker regarding discharge planning and possible admission to a facility. Please call family.

## 2017-08-01 NOTE — Care Management Note (Signed)
Case Management Note  Patient Details  Name: Jonathon [name] MRN: [id number] Date of Birth: Apr 14, 1936  CM consulted for [facility] services.  Pt would not qualify for SNF rehab through Medicare with no 3 night inpt qualifying stay.  CM spoke with Dr. Alvino Chapel who advised pt would not be admitted.  CM spoke with pt, sister, and daughter [name]) at bedside to discuss [facility] and private pay PCS or facility options.  Pt's daughter said he was not safe to be at home by himself at this time and they would private pay for a facility.  She advised that pt has been at both [facility] and [facility] in the past and would be ok to go to either and transition to another later if needed.  CM advised that if either location had a bed available at this time pt could go directly there from the ED but if not pt would still have to be D/C home with [facility] that could help with ALF placement.  Pt's daughter called [facility] who stated they had a bed and would be sending their assessment RN in the next 20 minutes.  She also advised that the [facility] agency they contract with is [facility].    CM contacted CSW to create an FL2 to send to [place].  CM contacted Ronalee Belts with [facility] who accepted pt for services.  CM updated Dr. Alvino Chapel.   [facility] RN, pt's daughter, and CM were outside of the pt's room discussing plan of care for [facility] unit when pt began to have a seizure lasting approx 45 seconds.  CM called RNs, tech, and Dr. Alvino Chapel to bedside.    15:40 pm [facility] advised they will still accept pt after hospital admission.  CM contacted Ronalee Belts with [facility] to advised D/C plan remains the same but that pt is now being admitted.   No further CM needs noted at this time.   Expected Discharge Date:   Unknown               Expected Discharge Plan:  [place], [facility]  In-House Referral:  Clinical Social Work  Discharge planning Services  CM  Consult  Choice offered to:  Patient, Adult Children, Sibling  HH Arranged:  RN, PT, OT, Social Work [employer] Agency:  [facility] [employer] [facility] [employer]  Status of Service:  In process, will continue to follow  Rae Mar, RN 08/01/2017, 3:05 PM

## 2017-08-01 NOTE — ED Notes (Addendum)
This RN was notified that patient began to have seizure like activity. This RN found patient in room sitting up in bed actively seizing. Seizure lasted approx 45 seconds. The head of the bed was lowered and pt was turned on to his side. Suction was provided to prevent aspiration. Dr. Alvino Chapel and primary RN notified. Seizure pads placed. New orders for CT Head.

## 2017-08-01 NOTE — Telephone Encounter (Signed)
Patient's daughter called and said that he is at [facility] ER.  Patient is acting very paranoid and sometime early this morning he had emptied everything out of his kitchen cabinets.  She is hoping they will admit him and eventually put him into a facility where he can be monitored closely.  She will keep [place] posted on what is going on.

## 2017-08-02 ENCOUNTER — Other Ambulatory Visit: Payer: Self-pay

## 2017-08-02 ENCOUNTER — Observation Stay (HOSPITAL_BASED_OUTPATIENT_CLINIC_OR_DEPARTMENT_OTHER): Payer: Medicare Other

## 2017-08-02 DIAGNOSIS — G909 Disorder of the autonomic nervous system, unspecified: Secondary | ICD-10-CM | POA: Diagnosis present

## 2017-08-02 DIAGNOSIS — I471 Supraventricular tachycardia: Secondary | ICD-10-CM | POA: Diagnosis present

## 2017-08-02 DIAGNOSIS — E538 Deficiency of other specified B group vitamins: Secondary | ICD-10-CM | POA: Diagnosis present

## 2017-08-02 DIAGNOSIS — J45909 Unspecified asthma, uncomplicated: Secondary | ICD-10-CM | POA: Diagnosis present

## 2017-08-02 DIAGNOSIS — R410 Disorientation, unspecified: Secondary | ICD-10-CM | POA: Diagnosis not present

## 2017-08-02 DIAGNOSIS — I951 Orthostatic hypotension: Secondary | ICD-10-CM | POA: Diagnosis present

## 2017-08-02 DIAGNOSIS — F0390 Unspecified dementia without behavioral disturbance: Secondary | ICD-10-CM | POA: Diagnosis present

## 2017-08-02 DIAGNOSIS — I361 Nonrheumatic tricuspid (valve) insufficiency: Secondary | ICD-10-CM

## 2017-08-02 DIAGNOSIS — E162 Hypoglycemia, unspecified: Secondary | ICD-10-CM | POA: Diagnosis present

## 2017-08-02 DIAGNOSIS — R5381 Other malaise: Secondary | ICD-10-CM | POA: Diagnosis present

## 2017-08-02 DIAGNOSIS — Z79899 Other long term (current) drug therapy: Secondary | ICD-10-CM | POA: Diagnosis not present

## 2017-08-02 DIAGNOSIS — R451 Restlessness and agitation: Secondary | ICD-10-CM | POA: Diagnosis present

## 2017-08-02 DIAGNOSIS — F329 Major depressive disorder, single episode, unspecified: Secondary | ICD-10-CM | POA: Diagnosis present

## 2017-08-02 DIAGNOSIS — Z781 Physical restraint status: Secondary | ICD-10-CM | POA: Diagnosis not present

## 2017-08-02 DIAGNOSIS — E519 Thiamine deficiency, unspecified: Secondary | ICD-10-CM | POA: Diagnosis present

## 2017-08-02 DIAGNOSIS — N400 Enlarged prostate without lower urinary tract symptoms: Secondary | ICD-10-CM | POA: Diagnosis present

## 2017-08-02 DIAGNOSIS — R569 Unspecified convulsions: Secondary | ICD-10-CM | POA: Diagnosis not present

## 2017-08-02 DIAGNOSIS — G9340 Encephalopathy, unspecified: Secondary | ICD-10-CM | POA: Diagnosis not present

## 2017-08-02 DIAGNOSIS — M109 Gout, unspecified: Secondary | ICD-10-CM | POA: Diagnosis present

## 2017-08-02 DIAGNOSIS — R531 Weakness: Secondary | ICD-10-CM | POA: Diagnosis present

## 2017-08-02 DIAGNOSIS — G6181 Chronic inflammatory demyelinating polyneuritis: Secondary | ICD-10-CM | POA: Diagnosis present

## 2017-08-02 DIAGNOSIS — I110 Hypertensive heart disease with heart failure: Secondary | ICD-10-CM | POA: Diagnosis present

## 2017-08-02 DIAGNOSIS — D509 Iron deficiency anemia, unspecified: Secondary | ICD-10-CM | POA: Diagnosis present

## 2017-08-02 DIAGNOSIS — Z87891 Personal history of nicotine dependence: Secondary | ICD-10-CM | POA: Diagnosis not present

## 2017-08-02 DIAGNOSIS — I5032 Chronic diastolic (congestive) heart failure: Secondary | ICD-10-CM | POA: Diagnosis present

## 2017-08-02 DIAGNOSIS — R001 Bradycardia, unspecified: Secondary | ICD-10-CM | POA: Diagnosis present

## 2017-08-02 DIAGNOSIS — F419 Anxiety disorder, unspecified: Secondary | ICD-10-CM | POA: Diagnosis present

## 2017-08-02 LAB — CBC
HCT: 32.5 % — ABNORMAL LOW (ref 39.0–52.0)
Hemoglobin: 10.8 g/dL — ABNORMAL LOW (ref 13.0–17.0)
MCH: 27.2 pg (ref 26.0–34.0)
MCHC: 33.2 g/dL (ref 30.0–36.0)
MCV: 81.9 fL (ref 78.0–100.0)
PLATELETS: 174 10*3/uL (ref 150–400)
RBC: 3.97 MIL/uL — ABNORMAL LOW (ref 4.22–5.81)
RDW: 15 % (ref 11.5–15.5)
WBC: 5.1 10*3/uL (ref 4.0–10.5)

## 2017-08-02 LAB — BASIC METABOLIC PANEL
Anion gap: 10 (ref 5–15)
BUN: 17 mg/dL (ref 6–20)
CALCIUM: 8.9 mg/dL (ref 8.9–10.3)
CO2: 23 mmol/L (ref 22–32)
CREATININE: 1 mg/dL (ref 0.61–1.24)
Chloride: 105 mmol/L (ref 101–111)
GFR calc Af Amer: >60 mL/min (ref >60)
GLUCOSE: 84 mg/dL (ref 65–99)
POTASSIUM: 3.9 mmol/L (ref 3.5–5.1)
SODIUM: 138 mmol/L (ref 135–145)

## 2017-08-02 LAB — GLUCOSE, CAPILLARY
GLUCOSE-CAPILLARY: 77 mg/dL (ref 65–99)
GLUCOSE-CAPILLARY: 119 mg/dL — AB (ref 65–99)
Glucose-Capillary: 109 mg/dL — ABNORMAL HIGH (ref 65–99)

## 2017-08-02 LAB — ECHOCARDIOGRAM COMPLETE
HEIGHTINCHES: 70 in
WEIGHTICAEL: 2761.92 [oz_av]

## 2017-08-02 LAB — TROPONIN I: Troponin I: 0.05 ng/mL (ref <0.03)

## 2017-08-02 MED ORDER — IMMUNE GLOBULIN (HUMAN) 20 GM/200ML IV SOLN
400.0000 mg/kg | Freq: Once | INTRAVENOUS | Status: AC
  Administered 2017-08-02: 16:00:00 400 mg/kg via INTRAVENOUS
  Filled 2017-08-02: qty 100

## 2017-08-02 MED ORDER — VITAMIN B-1 100 MG PO TABS
100.0000 mg | ORAL_TABLET | Freq: Every day | ORAL | Status: DC
  Administered 2017-08-02 – 2017-08-05 (×4): 100 mg via ORAL
  Filled 2017-08-02 (×4): qty 1

## 2017-08-02 NOTE — Progress Notes (Signed)
  Echocardiogram 2D Echocardiogram has been performed.  [name] 08/02/2017, 10:02 AM

## 2017-08-02 NOTE — Evaluation (Signed)
Physical Therapy Evaluation Patient Details Name: Jonathon [name] MRN: [id number] DOB: 02/01/1936 Today's Date: 08/02/2017   History of Present Illness  pt was admitted with a seizure vs syncope. PMH:  chronic inflammatory demyelinating polyneuropathy, pacemaker, cognitive decline, CHF, HTN  Clinical Impression  Pt admitted with above diagnosis. Pt currently with functional limitations due to the deficits listed below (see PT Problem List). Pt reports independence at baseline, requiring  incr assist to day for amb/balance/safety; will follow Iin acute setting; Pt will benefit from skilled PT to increase their independence and safety with mobility to allow discharge to the venue listed below.     Follow Up Recommendations Home health PT;Supervision - Intermittent    Equipment Recommendations  None recommended by PT    Recommendations for Other Services       Precautions / Restrictions Precautions Precautions: Fall Restrictions Weight Bearing Restrictions: No      Mobility  Bed Mobility               General bed mobility comments: supervision for OOB; head of bed raised  Transfers Overall transfer level: Needs assistance Equipment used: Rolling walker (2 wheeled);1 person hand held assist Transfers: Sit to/from Stand Sit to Stand: Min guard         General transfer comment: for safety  Ambulation/Gait Ambulation/Gait assistance: Min guard Ambulation Distance (Feet): 120 Feet Assistive device: 1 person hand held assist;Rolling walker (2 wheeled) Gait Pattern/deviations: Step-through pattern;Decreased stride length     General Gait Details: pt was carrying RW so switched to HHA, pt with heavy reliance on PT to balance during wt shifting  Stairs            Wheelchair Mobility    Modified Rankin (Stroke Patients Only)       Balance Overall balance assessment: Needs assistance Sitting-balance support: Feet supported;No upper extremity supported Sitting  balance-Leahy Scale: Good       Standing balance-Leahy Scale: Fair Standing balance comment: reliant on UE for dynamic balance, able to maintain static wtih supervision                             Pertinent Vitals/Pain Pain Assessment: No/denies pain    Home Living Family/patient expects to be discharged to:: Private residence Living Arrangements: Alone   Type of Home: House Home Access: Stairs to enter Entrance Stairs-Rails: Right;Left;Can reach both(rails in back) Entrance Stairs-Number of Steps: 3 front, 4 rails Home Layout: One level Home Equipment: [place] - 4 wheels;Cane - single point;Tub bench;Hand held shower head      Prior Function Level of Independence: Independent with assistive device(s)         Comments: amb with cane; pt does not drive; dtr or SCAT for transportation; reports he doesn't bathe unless someone is in the home     Hand Dominance        Extremity/Trunk Assessment   Upper Extremity Assessment Upper Extremity Assessment: Defer to OT evaluation;Overall [facility] for tasks assessed    Lower Extremity Assessment Lower Extremity Assessment: Overall WFL for tasks assessed       Communication   Communication: No difficulties  Cognition Arousal/Alertness: Awake/alert Behavior During Therapy: WFL for tasks assessed/performed Overall Cognitive Status: No family/caregiver present to determine baseline cognitive functioning                                 General  Comments: mostly wfls, but some of what he said was "a little off"; seems to have difficulty following topic of conversation, transferring information; follows commands and is oriented      General Comments      Exercises     Assessment/Plan    PT Assessment Patient needs continued PT services  PT Problem List Decreased activity tolerance;Decreased balance;Decreased coordination;Decreased mobility       PT Treatment Interventions DME instruction;Gait  training;Stair training;Functional mobility training;Balance training;Therapeutic exercise;Patient/family education    PT Goals (Current goals can be found in the Care Plan section)  Acute Rehab PT Goals Patient Stated Goal: none stated; agreeable to therapy PT Goal Formulation: With patient Time For Goal Achievement: 08/09/17 Potential to Achieve Goals: Good    Frequency Min 3X/week   Barriers to discharge        Co-evaluation PT/OT/SLP Co-Evaluation/Treatment: Yes Reason for Co-Treatment: For patient/therapist safety PT goals addressed during session: Mobility/safety with mobility OT goals addressed during session: ADL's and self-care       AM-PAC PT "6 Clicks" Daily Activity  Outcome Measure Difficulty turning over in bed (including adjusting bedclothes, sheets and blankets)?: A Little Difficulty moving from lying on back to sitting on the side of the bed? : A Little Difficulty sitting down on and standing up from a chair with arms (e.g., wheelchair, bedside commode, etc,.)?: A Little Help needed moving to and from a bed to chair (including a wheelchair)?: A Little Help needed walking in hospital room?: A Little Help needed climbing 3-5 steps with a railing? : A Little 6 Click Score: 18    End of Session Equipment Utilized During Treatment: Gait belt Activity Tolerance: Patient tolerated treatment well Patient left: in chair;with call bell/phone within reach;with chair alarm set   PT Visit Diagnosis: Difficulty in walking, not elsewhere classified (R26.2)    Time: 5726-2035 PT Time Calculation (min) (ACUTE ONLY): 25 min   Charges:   PT Evaluation $PT Eval Low Complexity: 1 Low     PT G CodesKenyon Ana, PT Pager: [phone] 08/02/2017   [facility] 08/02/2017, 12:35 PM

## 2017-08-02 NOTE — Progress Notes (Addendum)
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   Assessment & Plan:   Active Problems:   Essential hypertension   Symptomatic bradycardia   BPH (benign prostatic hyperplasia)   PACEMAKER, PERMANENT   Muscle weakness   Polyradiculoneuropathy (HCC)   Sustained SVT (HCC)   CIDP (chronic inflammatory demyelinating polyneuropathy) (HCC)   Syncope   Hepatic steatosis   Physical deconditioning   Alcoholism ([place])   B12 deficiency   Thiamine deficiency   Seizure ([place])   #) Syncopal versus seizure-like episode: Patient's episode of unresponsiveness and possible tonic-clonic movement has had been before.  Patient apparently had a history of autonomic neuropathy of unclear etiology and was on Mestinon some years ago.  It is unclear why this was taken off however patient was lost to follow-up in neurology clinic for some time.  Unfortunately patient cannot receive MRI as he has an old Medtronic pacemaker in place. -EP consult to interrogate Medtronic pacemaker shows no evidence of arrhythmia -Telemetry -Echo pending -EEG pending -We will perform orthostatic vitals, if these are positive we will consider restarting Mestinon versus fludrocortisone or midodrine  #) CIDP with worsening weakness: Patient appears to be increasingly less functional at home.  Per review of the chart patient went off of IVIG.  Discussed  with neurology and unclear if patient would benefit from 5-day bolus however reasonable to give a one-time dose of 400 mg IVIG. -PT consult -One-time dose of 400 mg IVIG, no evidence of infection at this time  #) Hypertension: At this time patient is quite hypertensive.  It is unclear if he was taken off the Mestinon because of his poorly controlled hypertension.  Will attempt orthostatics however will continue home antihypertensives at this time. -Continue irbesartan 300 mg daily -Continue metoprolol succinate 25 mg daily -Hold home furosemide 20 mg  #) SVT: Patient has history of SVT  -Continue flecainide 75 mg twice a day -Telemetry  #) Symptom medic bradycardia status post permanent pacemaker: Patient has Medtronic pacemaker - Telemetry -EPS interrogate pacer with no events  #) BPH: Unfortunately patient is on alpha 1 blocker for his BPH and this can unfortunately be associated with orthostatic hypotension as well. -Continue tamsulosin 0.4 mg daily  #) Possible B12/B1 deficiency: Some of patient's neurological symptoms have been attributed to vitamin deficiencies due to possible alcohol abuse however patient denies drinking. -Continue B12 supplementation -Continue thiamine supplementation  #) Gout: -PRN colchicine 1.2 mg followed by 0.6 mg 1 hour later if symptomatic  Fluids: Tolerating p.o. Electrolytes: Monitor and supplement Nutrition: 2 g sodium restricted diet  Prophylaxis: Heparin every 8 hours  Disposition: Pending physical therapy recommendations  Full code    Consultants:   EP for pacer interrogation  Procedures: (Don't include imaging studies which can be auto populated. Include things that cannot be auto populated i.e. Echo, Carotid and venous dopplers, Foley, Bipap, HD, tubes/drains, wound vac, central lines etc)  Pacer interrogation on 08/02/2017 shows no evidence of arrhythmias  Antimicrobials: (specify start and planned stop date. Auto  populated tables  are space occupying and do not give end dates)  None   Subjective: Patient reports that he is doing well.  He continues to be significantly weak particularly in the extensor and flexor muscles of his lower legs.  He otherwise denies any chest pain, palpitations, nausea, vomiting, diarrhea.  Objective: Vitals:   08/01/17 2130 08/01/17 2148 08/02/17 0129 08/02/17 0507  BP: 138/87 (!) 177/85 134/76 (!) 149/76  Pulse:  82 75 81  Resp: 18 18 18 18   Temp:  98.3 F (36.8 C) 98.7 F (37.1 C) 97.9 F (36.6 C)  TempSrc:  Oral Oral Oral  SpO2:  99% 98% 96%  Weight:  78.3 kg (172 lb 9.9 oz)    Height:  5\' 10"  (1.778 m)      Intake/Output Summary (Last 24 hours) at 08/02/2017 1314 Last data filed at 08/02/2017 1007 Gross per 24 hour  Intake 693.33 ml  Output 0 ml  Net 693.33 ml   Filed Weights   08/01/17 2148  Weight: 78.3 kg (172 lb 9.9 oz)    Examination:  General exam: Appears calm and comfortable  Respiratory system: Clear to auscultation. Respiratory effort normal. Cardiovascular system: Regular rate and rhythm, no murmurs. Gastrointestinal system: Abdomen is nondistended, soft and nontender. No organomegaly or masses felt. Normal bowel sounds heard. Central nervous system: Alert and oriented.  Bilateral lower extremity weakness and large extensor and flexor muscles of lower extremity, worse on right, large leg muscle atrophy noted Extremities: No lower extremity edema Skin: No rashes on visible skin Psychiatry: Judgement and insight appear normal. Mood & affect appropriate.     Data Reviewed: I have personally reviewed following labs and imaging studies  CBC: Recent Labs  Lab 08/01/17 1058 08/02/17 0500  WBC 6.1 5.1  NEUTROABS 3.1  --   HGB 12.8* 10.8*  HCT 37.8* 32.5*  MCV 80.8 81.9  PLT 194 854   Basic Metabolic Panel: Recent Labs  Lab 08/01/17 1058 08/02/17 0500  NA 135 138  K 3.9 3.9  CL 100* 105  CO2 24 23  GLUCOSE 92 84  BUN 21* 17  CREATININE  1.05 1.00  CALCIUM 9.3 8.9   GFR: Estimated Creatinine Clearance: 61.8 mL/min (by C-G formula based on SCr of 1 mg/dL). Liver Function Tests: Recent Labs  Lab 08/01/17 1058  AST 54*  ALT 36  ALKPHOS 96  BILITOT 1.3*  PROT 7.7  ALBUMIN 3.8   No results for input(s): LIPASE, AMYLASE in the last 168 hours. Recent Labs  Lab 08/01/17 1058  AMMONIA 19   Coagulation Profile: No results for input(s): INR, PROTIME in the last 168 hours. Cardiac Enzymes: Recent Labs  Lab 08/01/17 1058 08/01/17 2207 08/02/17 0500  TROPONINI <0.03 0.05* 0.05*   BNP (last 3 results) Recent Labs    01/06/17 1705  PROBNP 532.0*   HbA1C: No results for input(s): HGBA1C in the last 72 hours. CBG: Recent Labs  Lab 08/01/17 0940 08/01/17 1541 08/01/17 2126 08/02/17 0730  GLUCAP 97 95 83 77   Lipid Profile: No results for input(s): CHOL, HDL, LDLCALC, TRIG, CHOLHDL, LDLDIRECT in the last 72 hours. Thyroid Function Tests: No results for input(s): TSH, T4TOTAL, FREET4, T3FREE, THYROIDAB in the last 72 hours. Anemia Panel: No results for input(s): VITAMINB12, FOLATE, FERRITIN, TIBC, IRON, RETICCTPCT in the last 72 hours. Sepsis Labs: No results for input(s): PROCALCITON, LATICACIDVEN in the last 168 hours.  No results found for this or any previous visit (from the past 240 hour(s)).  Radiology Studies: Dg Chest 2 View  Result Date: 08/01/2017 CLINICAL DATA:  Altered mental status. EXAM: CHEST - 2 VIEW COMPARISON:  12/28/2016 FINDINGS: Dual lead cardiac pacemaker in stable position. Cardiomediastinal silhouette is normal. Mediastinal contours appear intact. Tortuosity and calcific atherosclerotic disease of the aorta. There is no evidence of focal airspace consolidation, pleural effusion or pneumothorax. Osseous structures are without acute abnormality. Soft tissues are grossly normal. IMPRESSION: No active cardiopulmonary disease. Electronically Signed   By: Fidela Salisbury M.D.    On: 08/01/2017 11:00   Ct Head Wo Contrast  Result Date: 08/01/2017 CLINICAL DATA:  Seizure.  Altered mental status. EXAM: CT HEAD WITHOUT CONTRAST TECHNIQUE: Contiguous axial images were obtained from the base of the skull through the vertex without intravenous contrast. COMPARISON:  12/28/2016 FINDINGS: Brain: Ventricles, cisterns and other CSF spaces are normal. There is mild chronic ischemic microvascular disease. There is no mass, mass effect, shift of midline structures or acute hemorrhage. There is no evidence of acute infarction. Vascular: No hyperdense vessel or unexpected calcification. Skull: Normal. Negative for fracture or focal lesion. Sinuses/Orbits: No acute finding. Other: None. IMPRESSION: No acute findings. Mild chronic ischemic microvascular disease. Electronically Signed   By: Marin Olp M.D.   On: 08/01/2017 16:10        Scheduled Meds: . [START ON 08/20/2017] cyanocobalamin  1,000 mcg Intramuscular Q30 days  . feeding supplement (ENSURE ENLIVE)  237 mL Oral BID BM  . flecainide  75 mg Oral BID  . heparin  5,000 Units Subcutaneous Q8H  . Immune Globulin 10%  400 mg/kg Intravenous Once  . irbesartan  75 mg Oral Daily  . metoprolol succinate  25 mg Oral Daily  . senna  1 tablet Oral BID  . sodium chloride flush  3 mL Intravenous Q12H  . tamsulosin  0.4 mg Oral Daily  . vitamin B-12  1,000 mcg Oral Daily   Continuous Infusions: . sodium chloride       LOS: 0 days    Time spent: [place], MD [facility]  If 7PM-7AM, please contact night-coverage [email or web address] Password [email or web address] 08/02/2017, 1:14 PM

## 2017-08-02 NOTE — Progress Notes (Signed)
Clinical Social Worker following patient for discharge needs. Per ED social worker patient has been accepted into [facility] and family has agreed to pay privately. Patient will be able to discharge to facility once medically cleared by MD   Rhea Pink, MSW,  [place]

## 2017-08-02 NOTE — Evaluation (Signed)
Occupational Therapy Evaluation Patient Details Name: Jonathon [name] MRN: [id number] DOB: [birth year] Today's Date: 08/02/2017    History of Present Illness pt was admitted with a seizure vs syncope. PMH:  chronic inflammatory demyelinating polyneuropathy, pacemaker, cognitive decline, CHF, HTN   Clinical Impression   This 82 year old man was admitted for the above.  At baseline, he lives alone and family assists with groceries. He also has someone clean for him.  He will benefit from continued OT to increase safety and independence with adls. Anticipate he may need increased assistance from family when he first returns home.  Goals in acute are for supervision level; he currently needs min A overall    Follow Up Recommendations  Home health OT(increased supervision for mob/ADLs)    Equipment Recommendations  None recommended by OT    Recommendations for Other Services       Precautions / Restrictions Precautions Precautions: Fall Restrictions Weight Bearing Restrictions: No      Mobility Bed Mobility               General bed mobility comments: supervision for OOB; head of bed raised  Transfers Overall transfer level: Needs assistance Equipment used: Rolling walker (2 wheeled);1 person hand held assist Transfers: Sit to/from Stand Sit to Stand: Min guard         General transfer comment: for safety    Balance Overall balance assessment: Needs assistance           Standing balance-Leahy Scale: Fair Standing balance comment: min guard for safety. Pt relies on cane for ambulation                           ADL either performed or assessed with clinical judgement   ADL Overall ADL's : Needs assistance/impaired Eating/Feeding: Independent   Grooming: Min guard;Standing   Upper Body Bathing: Set up;Sitting   Lower Body Bathing: Minimal assistance;Sit to/from stand   Upper Body Dressing : Set up;Sitting   Lower Body Dressing: Minimal  assistance;Sit to/from stand   Toilet Transfer: Minimal assistance;Ambulation;Comfort height toilet;RW   Toileting- Clothing Manipulation and Hygiene: Supervision/safety;Sitting/lateral lean         General ADL Comments: used toilet, washed hands and ambulated in hallway.  Min A for balance. Pt usually uses a cane     Vision         Perception     Praxis      Pertinent Vitals/Pain Pain Assessment: No/denies pain     Hand Dominance     Extremity/Trunk Assessment Upper Extremity Assessment Upper Extremity Assessment: Overall WFL for tasks assessed           Communication Communication Communication: No difficulties   Cognition Arousal/Alertness: Awake/alert Behavior During Therapy: WFL for tasks assessed/performed Overall Cognitive Status: No family/caregiver present to determine baseline cognitive functioning                                 General Comments: mostly wfls, but some of what he said was "a little off"   General Comments       Exercises     Shoulder Instructions      Home Living Family/patient expects to be discharged to:: Private residence Living Arrangements: Alone   Type of Home: House Home Access: Stairs to enter [employer] of Steps: 3 front, 4 rails Entrance Stairs-Rails: Right;Left;Can reach both(rails in back) Home  Layout: One level     Bathroom Shower/Tub: Tub/shower unit;Curtain   Bathroom Toilet: Handicapped height     Home Equipment: Environmental consultant - 4 wheels;Cane - single point;Tub bench;Hand held shower head          Prior Functioning/Environment Level of Independence: Independent with assistive device(s)        Comments: amb with cane; pt does not drive; dtr or SCAT for transportation; reports he doesn't bathe unless someone is in the home        OT Problem List: Impaired balance (sitting and/or standing);Decreased strength;Decreased knowledge of use of DME or AE;Decreased activity tolerance       OT Treatment/Interventions: Self-care/ADL training;Energy conservation;Balance training;Patient/family education;Therapeutic activities    OT Goals(Current goals can be found in the care plan section) Acute Rehab OT Goals Patient Stated Goal: none stated; agreeable to therapy OT Goal Formulation: With patient Time For Goal Achievement: 08/16/17 Potential to Achieve Goals: Good ADL Goals Pt Will Transfer to Toilet: with supervision;ambulating(comfort height with SPC) Additional ADL Goal #1: pt will gather adl supplies witih SPC at supervision level and complete ADL without supervision/assistance  OT Frequency: Min 2X/week   Barriers to D/C:            Co-evaluation PT/OT/SLP Co-Evaluation/Treatment: Yes Reason for Co-Treatment: For patient/therapist safety PT goals addressed during session: Mobility/safety with mobility OT goals addressed during session: ADL's and self-care      AM-PAC PT "6 Clicks" Daily Activity     Outcome Measure Help from another person eating meals?: None Help from another person taking care of personal grooming?: A Little Help from another person toileting, which includes using toliet, bedpan, or urinal?: A Little Help from another person bathing (including washing, rinsing, drying)?: A Little Help from another person to put on and taking off regular upper body clothing?: A Little Help from another person to put on and taking off regular lower body clothing?: A Little 6 Click Score: 19   End of Session    Activity Tolerance: Patient tolerated treatment well Patient left: in chair;with call bell/phone within reach;with chair alarm set  OT Visit Diagnosis: Muscle weakness (generalized) (M62.81);Unsteadiness on feet (R26.81)                Time: 6962-9528 OT Time Calculation (min): 21 min Charges:  OT General Charges $OT Visit: 1 Visit OT Evaluation $OT Eval Low Complexity: 1 Low G-Codes:     [place],  OTR/L [phone] 08/02/2017  Jonathon [name] 08/02/2017, 11:38 AM

## 2017-08-02 NOTE — Progress Notes (Signed)
PT Cancellation Note  Patient Details Name: Jonathon [name] MRN: [id number] DOB: 1936-03-31   Cancelled Treatment:    Reason Eval/Treat Not Completed: Patient at procedure or test/unavailable; continue efforts to evaluate pt   [facility] 08/02/2017, 9:36 AM

## 2017-08-03 ENCOUNTER — Inpatient Hospital Stay (HOSPITAL_COMMUNITY): Payer: Medicare Other

## 2017-08-03 DIAGNOSIS — R410 Disorientation, unspecified: Secondary | ICD-10-CM

## 2017-08-03 LAB — CBC
HCT: 34.2 % — ABNORMAL LOW (ref 39.0–52.0)
Hemoglobin: 11.5 g/dL — ABNORMAL LOW (ref 13.0–17.0)
MCH: 27.3 pg (ref 26.0–34.0)
MCHC: 33.6 g/dL (ref 30.0–36.0)
MCV: 81.2 fL (ref 78.0–100.0)
Platelets: 178 10*3/uL (ref 150–400)
RBC: 4.21 MIL/uL — ABNORMAL LOW (ref 4.22–5.81)
RDW: 14.9 % (ref 11.5–15.5)
WBC: 6 10*3/uL (ref 4.0–10.5)

## 2017-08-03 LAB — BASIC METABOLIC PANEL
Anion gap: 9 (ref 5–15)
BUN: 16 mg/dL (ref 6–20)
GFR calc non Af Amer: >60 mL/min (ref >60)
Glucose, Bld: 106 mg/dL — ABNORMAL HIGH (ref 65–99)
Potassium: 3.9 mmol/L (ref 3.5–5.1)
Sodium: 136 mmol/L (ref 135–145)

## 2017-08-03 LAB — GLUCOSE, CAPILLARY
GLUCOSE-CAPILLARY: 89 mg/dL (ref 65–99)
GLUCOSE-CAPILLARY: 83 mg/dL (ref 65–99)
Glucose-Capillary: 103 mg/dL — ABNORMAL HIGH (ref 65–99)
Glucose-Capillary: 104 mg/dL — ABNORMAL HIGH (ref 65–99)

## 2017-08-03 LAB — BASIC METABOLIC PANEL WITH GFR
CO2: 25 mmol/L (ref 22–32)
Calcium: 9 mg/dL (ref 8.9–10.3)
Chloride: 102 mmol/L (ref 101–111)
Creatinine, Ser: 0.95 mg/dL (ref 0.61–1.24)
GFR calc Af Amer: >60 mL/min (ref >60)

## 2017-08-03 LAB — MAGNESIUM: Magnesium: 1.7 mg/dL (ref 1.7–2.4)

## 2017-08-03 MED ORDER — OLANZAPINE 10 MG IM SOLR
5.0000 mg | Freq: Three times a day (TID) | INTRAMUSCULAR | Status: DC | PRN
  Administered 2017-08-03: 5 mg via INTRAMUSCULAR
  Filled 2017-08-03: qty 10

## 2017-08-03 MED ORDER — LORAZEPAM 2 MG/ML IJ SOLN
1.0000 mg | Freq: Four times a day (QID) | INTRAMUSCULAR | Status: DC | PRN
  Administered 2017-08-03: 1 mg via INTRAVENOUS
  Filled 2017-08-03: qty 1

## 2017-08-03 MED ORDER — HALOPERIDOL LACTATE 5 MG/ML IJ SOLN
5.0000 mg | Freq: Once | INTRAMUSCULAR | Status: AC
  Administered 2017-08-03: 5 mg via INTRAVENOUS
  Filled 2017-08-03: qty 1

## 2017-08-03 MED ORDER — ESCITALOPRAM OXALATE 10 MG PO TABS
5.0000 mg | ORAL_TABLET | Freq: Every day | ORAL | Status: DC
  Administered 2017-08-03 – 2017-08-05 (×3): 5 mg via ORAL
  Filled 2017-08-03 (×3): qty 1

## 2017-08-03 NOTE — Progress Notes (Signed)
OT Cancellation Note  Patient Details Name: Jonathon [name] MRN: [id number] DOB: [birth year]   Cancelled Treatment:    Reason Eval/Treat Not Completed: Patient at procedure or test/ unavailable  [place], Mickel Baas, OT [phone] 08/03/2017, 11:25 AM

## 2017-08-03 NOTE — Progress Notes (Signed)
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  In the interim he has developed some altered mental status.   Assessment & Plan:   Active Problems:   Essential hypertension   Symptomatic bradycardia   BPH (benign prostatic hyperplasia)   PACEMAKER, PERMANENT   Muscle weakness   Polyradiculoneuropathy (HCC)   Sustained SVT (HCC)   CIDP (chronic inflammatory demyelinating polyneuropathy) (HCC)   Syncope   Hepatic steatosis   Physical deconditioning   Alcoholism ([place])   B12 deficiency   Thiamine deficiency   Seizure ([place])   #) Altered mental status: Yesterday patient began to become increasingly paranoid and reported hallucinations.  Nursing staff reports that patient continues to exhibit bizarre behaviors.  UA on admission was unremarkable.  This would be far too far out to be postictal.  Unclear if this could possibly represent temporal lobe seizures however not classic.  Apparently patient was initially admitted for concern for altered mental status however his mental status yesterday and on admission appeared to be unremarkable.  There does not appear to be any evidence of withdrawal from either medications or alcohol at this time.  There is no evidence of an infective process ongoing. -We will discuss with daughter about his baseline mental status -Consider psychiatry consult - Olanzapine 5 mg every 8  hours as needed for agitation  #) Syncopal versus seizure-like episode: Patient has had these episodes of unresponsiveness in the past with negative workup.  Unfortunately patient cannot receive MRI due to pacemaker not being MRI compatible.  Per chart review patient has been on Mestinon in the past for orthostatic hypotension. -EP consult to interrogate Medtronic pacemaker shows no evidence of arrhythmia -Telemetry -Echo from 08/02/2017 shows normal EF with grade 1 diastolic dysfunction -EEG pending  #) CIDP with worsening weakness: Patient appears to be increasingly less functional at home.  Per review of the chart patient went off of IVIG. -Status post one-time dose of 400 mg IVIG on 08/02/2017 -PT consult recommends home health PT, family is opting for assisted living facility with self-pay   #) Hypertension:  -Continue irbesartan 300 mg daily -Continue metoprolol succinate 25 mg daily -Hold home furosemide 20 mg  #) SVT: Patient has history of SVT  -Continue flecainide 75 mg twice a day -Telemetry  #) Symptom medic bradycardia status post permanent pacemaker: Patient has Medtronic pacemaker - Telemetry -EPS interrogate pacer with no events  #) BPH: Unfortunately patient is on alpha 1 blocker for his BPH and this can unfortunately be associated with orthostatic hypotension as well. -Continue tamsulosin 0.4 mg daily  #) Possible B12/B1 deficiency: Some of patient's neurological symptoms have been attributed to vitamin deficiencies due to possible alcohol abuse however patient denies drinking. -Continue B12 supplementation -Continue thiamine supplementation  #) Gout: -PRN colchicine 1.2 mg followed by 0.6 mg 1 hour later if symptomatic  #) Psych: -Restarted escitalopram 5 mg daily  Fluids: Tolerating p.o. Electrolytes: Monitor and supplement Nutrition: 2 g sodium restricted diet  Prophylaxis: Heparin every 8 hours  Disposition:  Pending physical therapy  recommendations  Full code    Consultants:   EP for pacer interrogation  Procedures: (Don't include imaging studies which can be auto populated. Include things that cannot be auto populated i.e. Echo, Carotid and venous dopplers, Foley, Bipap, HD, tubes/drains, wound vac, central lines etc)  Pacer interrogation on 08/02/2017 shows no evidence of arrhythmias Echo 08/02/2017:- Left ventricle: The cavity size was normal. There was mild   concentric hypertrophy. Systolic function was vigorous. The   estimated ejection fraction was in the range of 65% to 70%. Wall   motion was normal; there were no regional wall motion   abnormalities. Doppler parameters are consistent with abnormal   left ventricular relaxation (grade 1 diastolic dysfunction).   There was no evidence of elevated ventricular filling pressure by   Doppler parameters. - Aortic valve: Trileaflet; moderately thickened, moderately   calcified leaflets. Transvalvular velocity was within the normal   range. There was no stenosis. There was no regurgitation. - Mitral valve: There was trivial regurgitation. - Right ventricle: Pacer wire or catheter noted in right ventricle.   Systolic function was normal. - Right atrium: Pacer wire or catheter noted in right atrium. - Tricuspid valve: There was mild regurgitation. - Inferior vena cava: The vessel was normal in size.  - Pericardium, extracardiac: There was no pericardial effusion.  Antimicrobials: (specify start and planned stop date. Auto populated tables are space occupying and do not give end dates)  None   Subjective: Patient appears to be somewhat confused today.  He appears to have hallucinations and delusions.  He apparently insisted on walking out of bed and criticized and other family members clothing.  Objective: Vitals:   08/02/17 2104 08/02/17 2203 08/03/17 0458 08/03/17 0801  BP: (!) 168/77 (!) 164/78 (!) 191/92 (!) 182/80  Pulse: 88 87 84 84  Resp: 15 18 17     Temp: 98.2 F (36.8 C) 98.6 F (37 C) 98.7 F (37.1 C)   TempSrc: Axillary Oral Oral   SpO2: 91% 99% 100%   Weight:      Height:        Intake/Output Summary (Last 24 hours) at 08/03/2017 1012 Last data filed at 08/03/2017 0825 Gross per 24 hour  Intake 1170 ml  Output 827 ml  Net 343 ml   Filed Weights   08/01/17 2148  Weight: 78.3 kg (172 lb 9.9 oz)    Examination:  General exam: Somewhat agitated Respiratory system: Clear to auscultation. Respiratory effort normal. Cardiovascular system: Regular rate and rhythm, no murmurs, permanent pacemaker site is clean dry and intact Gastrointestinal system: Abdomen is nondistended, soft and nontender. No organomegaly or masses felt. Normal bowel sounds heard. Central nervous system: Alert but not oriented to situation.  Bilateral lower extremity weakness and large extensor and flexor muscles of lower extremity, worse on right, large leg muscle atrophy noted Extremities: No lower extremity edema Skin: No rashes on visible skin Psychiatry: Judgement and insight appear poor.  He continues to go off on tangents that sound paranoid and are not coherent.  His ability to pass orientation suggests that he is compensating well.    Data Reviewed: I have personally reviewed following labs and imaging studies  CBC: Recent Labs  Lab 08/01/17 1058 08/02/17 0500 08/03/17 0436  WBC 6.1 5.1 6.0  NEUTROABS 3.1  --   --   HGB 12.8* 10.8* 11.5*  HCT 37.8* 32.5* 34.2*  MCV 80.8 81.9 81.2  PLT 194 174 678   Basic Metabolic Panel:  Recent Labs  Lab 08/01/17 1058 08/02/17 0500 08/03/17 0436  NA 135 138 136  K 3.9 3.9 3.9  CL 100* 105 102  CO2 24 23 25   GLUCOSE 92 84 106*  BUN 21* 17 16  CREATININE 1.05 1.00 0.95  CALCIUM 9.3 8.9 9.0  MG  --   --  1.7   GFR: Estimated Creatinine Clearance: 65.1 mL/min (by C-G formula based on SCr of 0.95 mg/dL). Liver Function Tests: Recent Labs  Lab 08/01/17 1058  AST 54*  ALT 36  ALKPHOS 96   BILITOT 1.3*  PROT 7.7  ALBUMIN 3.8   No results for input(s): LIPASE, AMYLASE in the last 168 hours. Recent Labs  Lab 08/01/17 1058  AMMONIA 19   Coagulation Profile: No results for input(s): INR, PROTIME in the last 168 hours. Cardiac Enzymes: Recent Labs  Lab 08/01/17 1058 08/01/17 2207 08/02/17 0500  TROPONINI <0.03 0.05* 0.05*   BNP (last 3 results) Recent Labs    01/06/17 1705  PROBNP 532.0*   HbA1C: No results for input(s): HGBA1C in the last 72 hours. CBG: Recent Labs  Lab 08/01/17 2126 08/02/17 0730 08/02/17 1733 08/02/17 2132 08/03/17 0735  GLUCAP 83 77 119* 109* 89   Lipid Profile: No results for input(s): CHOL, HDL, LDLCALC, TRIG, CHOLHDL, LDLDIRECT in the last 72 hours. Thyroid Function Tests: No results for input(s): TSH, T4TOTAL, FREET4, T3FREE, THYROIDAB in the last 72 hours. Anemia Panel: No results for input(s): VITAMINB12, FOLATE, FERRITIN, TIBC, IRON, RETICCTPCT in the last 72 hours. Sepsis Labs: No results for input(s): PROCALCITON, LATICACIDVEN in the last 168 hours.  No results found for this or any previous visit (from the past 240 hour(s)).       Radiology Studies: Dg Chest 2 View  Result Date: 08/01/2017 CLINICAL DATA:  Altered mental status. EXAM: CHEST - 2 VIEW COMPARISON:  12/28/2016 FINDINGS: Dual lead cardiac pacemaker in stable position. Cardiomediastinal silhouette is normal. Mediastinal contours appear intact. Tortuosity and calcific atherosclerotic disease of the aorta. There is no evidence of focal airspace consolidation, pleural effusion or pneumothorax. Osseous structures are without acute abnormality. Soft tissues are grossly normal. IMPRESSION: No active cardiopulmonary disease. Electronically Signed   By: Fidela Salisbury M.D.   On: 08/01/2017 11:00   Ct Head Wo Contrast  Result Date: 08/01/2017 CLINICAL DATA:  Seizure.  Altered mental status. EXAM: CT HEAD WITHOUT CONTRAST TECHNIQUE: Contiguous axial images  were obtained from the base of the skull through the vertex without intravenous contrast. COMPARISON:  12/28/2016 FINDINGS: Brain: Ventricles, cisterns and other CSF spaces are normal. There is mild chronic ischemic microvascular disease. There is no mass, mass effect, shift of midline structures or acute hemorrhage. There is no evidence of acute infarction. Vascular: No hyperdense vessel or unexpected calcification. Skull: Normal. Negative for fracture or focal lesion. Sinuses/Orbits: No acute finding. Other: None. IMPRESSION: No acute findings. Mild chronic ischemic microvascular disease. Electronically Signed   By: Marin Olp M.D.   On: 08/01/2017 16:10        Scheduled Meds: . [START ON 08/20/2017] cyanocobalamin  1,000 mcg Intramuscular Q30 days  . feeding supplement (ENSURE ENLIVE)  237 mL Oral BID BM  . flecainide  75 mg Oral BID  . heparin  5,000 Units Subcutaneous Q8H  . irbesartan  75 mg Oral Daily  . metoprolol succinate  25 mg Oral Daily  . senna  1 tablet Oral BID  . sodium chloride flush  3 mL Intravenous Q12H  . tamsulosin  0.4  mg Oral Daily  . thiamine  100 mg Oral Daily  . vitamin B-12  1,000 mcg Oral Daily   Continuous Infusions: . sodium chloride       LOS: 1 day    Time spent: [place], MD [facility]  If 7PM-7AM, please contact night-coverage [email or web address] Password [email or web address] 08/03/2017, 10:12 AM

## 2017-08-03 NOTE — Progress Notes (Signed)
MD notified for BP of 182/80. Pt received hydralazine at 0600. Will continue to monitor.  Barbee [place]. Brigitte Pulse, RN

## 2017-08-03 NOTE — Progress Notes (Signed)
Bilateral carotid duplex completed. 1% to 39% ICA stenosis bilaterally with antegrade vertebral flow. [facility], [place]  08/03/2017, 11:32 AM

## 2017-08-04 ENCOUNTER — Inpatient Hospital Stay (HOSPITAL_COMMUNITY)
Admit: 2017-08-04 | Discharge: 2017-08-04 | Disposition: A | Discharge status: Home / Self Care | Payer: Medicare Other | Attending: Internal Medicine | Admitting: Internal Medicine

## 2017-08-04 ENCOUNTER — Telehealth: Payer: Self-pay | Admitting: Internal Medicine

## 2017-08-04 DIAGNOSIS — G9340 Encephalopathy, unspecified: Secondary | ICD-10-CM

## 2017-08-04 DIAGNOSIS — F0390 Unspecified dementia without behavioral disturbance: Secondary | ICD-10-CM

## 2017-08-04 LAB — GLUCOSE, CAPILLARY
GLUCOSE-CAPILLARY: 113 mg/dL — AB (ref 65–99)
Glucose-Capillary: 101 mg/dL — ABNORMAL HIGH (ref 65–99)
Glucose-Capillary: 104 mg/dL — ABNORMAL HIGH (ref 65–99)

## 2017-08-04 MED ORDER — IRBESARTAN 300 MG PO TABS
300.0000 mg | ORAL_TABLET | Freq: Every day | ORAL | Status: DC
  Administered 2017-08-05: 300 mg via ORAL
  Filled 2017-08-04: qty 1

## 2017-08-04 MED ORDER — OLANZAPINE 5 MG PO TABS
5.0000 mg | ORAL_TABLET | Freq: Every day | ORAL | Status: DC
  Administered 2017-08-05: 5 mg via ORAL
  Filled 2017-08-04: qty 1

## 2017-08-04 MED ORDER — TRAZODONE HCL 50 MG PO TABS: 50.0000 mg | ORAL_TABLET | Freq: Every evening | ORAL | 0 refills | Status: DC | PRN

## 2017-08-04 NOTE — Telephone Encounter (Signed)
Copied from [place] [id number]. Topic: Quick Communication - See Telephone Encounter >> Aug 04, 2017  8:28 AM Burnis Medin, NT wrote: CRM for notification. See Telephone encounter for: 08/04/17. Daughter called and wanted to let the doctor know that her dad is in the hospital and has been there since  Thursday. Daughter would like a call back.

## 2017-08-04 NOTE — Progress Notes (Signed)
Occupational Therapy Treatment Patient Details Name: Jonathon [name] MRN: [id number] DOB: [birth year] Today's Date: 08/04/2017    History of present illness pt was admitted with a seizure vs syncope. PMH:  chronic inflammatory demyelinating polyneuropathy, pacemaker, cognitive decline, CHF, HTN   OT comments    Follow Up Recommendations  Home health OT;Other (comment)(min A for ADL's at ALF)    Equipment Recommendations  None recommended by OT    Recommendations for Other Services      Precautions / Restrictions Precautions Precautions: Fall       Mobility Bed Mobility Overal bed mobility: Needs Assistance Bed Mobility: Supine to Sit     Supine to sit: Supervision        Transfers Overall transfer level: Needs assistance Equipment used: Rolling walker (2 wheeled) Transfers: Sit to/from [facility] Sit to Stand: Min guard Stand pivot transfers: Min guard            Balance Overall balance assessment: Needs assistance Sitting-balance support: Feet supported;No upper extremity supported Sitting balance-Leahy Scale: Good       Standing balance-Leahy Scale: Fair Standing balance comment: reliant on UE for dynamic balance, able to maintain static wtih supervision                           ADL either performed or assessed with clinical judgement   ADL Overall ADL's : Needs assistance/impaired     Grooming: Set up;Sitting           Upper Body Dressing : Set up;Sitting   Lower Body Dressing: Minimal assistance;Sit to/from stand   Toilet Transfer: Minimal assistance;Ambulation;Comfort height toilet;RW   Toileting- Clothing Manipulation and Hygiene: Minimal assistance;Sit to/from stand;Cueing for safety;Cueing for sequencing         General ADL Comments: Pt agreeable to OOB.  Pt overall min A.  Pt will need min A with ADL activity at ALF.       Vision Patient Visual Report: No change from baseline            Cognition  Arousal/Alertness: Awake/alert Behavior During Therapy: WFL for tasks assessed/performed Overall Cognitive Status: No family/caregiver present to determine baseline cognitive functioning                                                     Pertinent Vitals/ Pain       Pain Assessment: No/denies pain     Prior Functioning/Environment              Frequency  Min 2X/week        Progress Toward Goals  OT Goals(current goals can now be found in the care plan section)  Progress towards OT goals: Progressing toward goals     Plan Discharge plan remains appropriate       AM-PAC PT "6 Clicks" Daily Activity     Outcome Measure   Help from another person eating meals?: None Help from another person taking care of personal grooming?: A Little Help from another person toileting, which includes using toliet, bedpan, or urinal?: A Little Help from another person bathing (including washing, rinsing, drying)?: A Little Help from another person to put on and taking off regular upper body clothing?: A Little Help from another person to put on and taking off regular lower body  clothing?: A Little 6 Click Score: 19    End of Session    OT Visit Diagnosis: Muscle weakness (generalized) (M62.81);Unsteadiness on feet (R26.81)   Activity Tolerance Patient tolerated treatment well   Patient Left in chair;with call bell/phone within reach;with chair alarm set   Nurse Communication Mobility status        Time: 1610-9604 OT Time Calculation (min): 9 min  Charges: OT General Charges $OT Visit: 1 Visit OT Treatments $Self Care/Home Management : 8-22 mins  [name], [name]   [name] 08/04/2017, 2:09 PM

## 2017-08-04 NOTE — Progress Notes (Addendum)
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  In the interim he has developed some altered mental status.   Assessment & Plan:   Active Problems:   Essential hypertension   Symptomatic bradycardia   BPH (benign prostatic hyperplasia)   PACEMAKER, PERMANENT   Muscle weakness   Polyradiculoneuropathy (HCC)   Sustained SVT (HCC)   CIDP (chronic inflammatory demyelinating polyneuropathy) (HCC)   Syncope   Hepatic steatosis   Physical deconditioning   Acute encephalopathy   Alcoholism ([place])   B12 deficiency   Thiamine deficiency   Seizure ([place])   Dementia   #) Altered mental status: Today patient continues to be altered.  On discussion with his daughter this is apparently been ongoing for several months which is why she brought him to the ED for placement.  Apparently overnight he required restraints as he was quite agitated. -Consider psychiatry consult - Olanzapine 5 mg every 8 hours as needed for agitation -Olanzapine 5 mg nightly -Will attempt to remove restraints and try redirecting patient as this is interfering with his discharge  #) Syncopal versus seizure-like episode: Patient has had these episodes of unresponsiveness in the past with negative workup.  Unfortunately patient cannot receive MRI due to pacemaker not being MRI compatible.  -EP consult to interrogate Medtronic pacemaker shows no  evidence of arrhythmia -Telemetry -Echo from 08/02/2017 shows normal EF with grade 1 diastolic dysfunction -EEG pending  #) CIDP with worsening weakness: Patient appears to be increasingly less functional at home.  Per review of the chart patient went off of IVIG. -Status post one-time dose of 400 mg IVIG on 08/02/2017 -PT consult recommends home health PT, family is opting for assisted living facility with self-pay however his altered mental status which appears to be chronic is interfering with discharge   #) Hypertension:  -Continue irbesartan 300 mg daily -Continue metoprolol succinate 25 mg daily -Hold home furosemide 20 mg  #) SVT: Patient has history of SVT  -Continue flecainide 75 mg twice a day -Telemetry  #) Symptom medic bradycardia status post permanent pacemaker: Patient has Medtronic pacemaker - Telemetry -EPS interrogate pacer with no events  #) BPH: -Continue tamsulosin 0.4 mg daily  #) Possible B12/B1 deficiency:  -Continue B12 supplementation -Continue thiamine supplementation  #) Gout: -PRN colchicine 1.2 mg followed by 0.6 mg 1 hour later if symptomatic  #) Psych: -Continue escitalopram 5 mg daily  Fluids: Tolerating p.o. Electrolytes: Monitor and supplement Nutrition: 2 g sodium restricted diet  Prophylaxis: Heparin every 8 hours  Disposition: Pending safe discharge due to mental status and no requirements for restraints  Full code    Consultants:   EP for pacer interrogation  Procedures: (Don't include imaging studies which can be auto populated. Include things that cannot be auto populated i.e. Echo, Carotid and venous dopplers, Foley, Bipap, HD, tubes/drains, wound vac, central lines etc)  Pacer interrogation on 08/02/2017 shows no evidence of arrhythmias Echo 08/02/2017:- Left ventricle: The cavity size was normal. There was mild   concentric hypertrophy. Systolic  function was vigorous. The   estimated ejection fraction was in the range of  65% to 70%. Wall   motion was normal; there were no regional wall motion   abnormalities. Doppler parameters are consistent with abnormal   left ventricular relaxation (grade 1 diastolic dysfunction).   There was no evidence of elevated ventricular filling pressure by   Doppler parameters. - Aortic valve: Trileaflet; moderately thickened, moderately   calcified leaflets. Transvalvular velocity was within the normal   range. There was no stenosis. There was no regurgitation. - Mitral valve: There was trivial regurgitation. - Right ventricle: Pacer wire or catheter noted in right ventricle.   Systolic function was normal. - Right atrium: Pacer wire or catheter noted in right atrium. - Tricuspid valve: There was mild regurgitation. - Inferior vena cava: The vessel was normal in size.  - Pericardium, extracardiac: There was no pericardial effusion.  Antimicrobials: (specify start and planned stop date. Auto populated tables are space occupying and do not give end dates)  None   Subjective: Patient is sleeping in bed.  Apparently overnight patient required restraints as he was out of bed a great deal.  Objective: Vitals:   08/03/17 1936 08/03/17 2039 08/03/17 2110 08/04/17 0500  BP: (!) 191/82 (!) 191/81 (!) 175/83 (!) 170/79  Pulse: 98   86  Resp: 17     Temp: 99.2 F (37.3 C)   98.6 F (37 C)  TempSrc: Oral   Oral  SpO2: 100%   98%  Weight:      Height:        Intake/Output Summary (Last 24 hours) at 08/04/2017 1048 Last data filed at 08/03/2017 2108 Gross per 24 hour  Intake 150 ml  Output 300 ml  Net -150 ml   Filed Weights   08/01/17 2148  Weight: 78.3 kg (172 lb 9.9 oz)    Examination:  General exam: Sleeping in bed Respiratory system: Clear to auscultation. Respiratory effort normal. Cardiovascular system: Regular rate and rhythm, no murmurs, permanent pacemaker site is clean dry and intact Gastrointestinal system: Abdomen is nondistended, soft and nontender.  No organomegaly or masses felt. Normal bowel sounds heard. Central nervous system: Alert but not oriented to situation.  Bilateral lower extremity weakness and large extensor and flexor muscles of lower extremity, worse on right, large leg muscle atrophy noted Extremities: No lower extremity edema Skin: No rashes on visible skin Psychiatry: Not assessed today as patient was sleeping in bed    Data Reviewed: I have personally reviewed following labs and imaging studies  CBC: Recent Labs  Lab 08/01/17 1058 08/02/17 0500 08/03/17 0436  WBC 6.1 5.1 6.0  NEUTROABS 3.1  --   --   HGB 12.8* 10.8* 11.5*  HCT 37.8* 32.5* 34.2*  MCV 80.8 81.9 81.2  PLT 194 174 811   Basic Metabolic Panel: Recent Labs  Lab 08/01/17 1058 08/02/17 0500 08/03/17 0436  NA 135 138 136  K 3.9 3.9 3.9  CL 100* 105 102  CO2 24 23 25   GLUCOSE 92 84 106*  BUN 21* 17 16  CREATININE 1.05 1.00 0.95  CALCIUM 9.3 8.9 9.0  MG  --   --  1.7   GFR: Estimated Creatinine Clearance: 65.1 mL/min (by C-G formula based on SCr of 0.95 mg/dL). Liver Function Tests: Recent Labs  Lab 08/01/17 1058  AST 54*  ALT 36  ALKPHOS 96  BILITOT 1.3*  PROT 7.7  ALBUMIN 3.8   No results for input(s): LIPASE, AMYLASE in the  last 168 hours. Recent Labs  Lab 08/01/17 1058  AMMONIA 19   Coagulation Profile: No results for input(s): INR, PROTIME in the last 168 hours. Cardiac Enzymes: Recent Labs  Lab 08/01/17 1058 08/01/17 2207 08/02/17 0500  TROPONINI <0.03 0.05* 0.05*   BNP (last 3 results) Recent Labs    01/06/17 1705  PROBNP 532.0*   HbA1C: No results for input(s): HGBA1C in the last 72 hours. CBG: Recent Labs  Lab 08/03/17 0735 08/03/17 1215 08/03/17 1643 08/03/17 1936 08/04/17 0729  GLUCAP 89 83 103* 104* 113*   Lipid Profile: No results for input(s): CHOL, HDL, LDLCALC, TRIG, CHOLHDL, LDLDIRECT in the last 72 hours. Thyroid Function Tests: No results for input(s): TSH, T4TOTAL, FREET4,  T3FREE, THYROIDAB in the last 72 hours. Anemia Panel: No results for input(s): VITAMINB12, FOLATE, FERRITIN, TIBC, IRON, RETICCTPCT in the last 72 hours. Sepsis Labs: No results for input(s): PROCALCITON, LATICACIDVEN in the last 168 hours.  No results found for this or any previous visit (from the past 240 hour(s)).       Radiology Studies: No results found.      Scheduled Meds: . [START ON 08/20/2017] cyanocobalamin  1,000 mcg Intramuscular Q30 days  . escitalopram  5 mg Oral Daily  . feeding supplement (ENSURE ENLIVE)  237 mL Oral BID BM  . flecainide  75 mg Oral BID  . heparin  5,000 Units Subcutaneous Q8H  . [START ON 08/05/2017] irbesartan  300 mg Oral Daily  . metoprolol succinate  25 mg Oral Daily  . OLANZapine  5 mg Oral QHS  . senna  1 tablet Oral BID  . sodium chloride flush  3 mL Intravenous Q12H  . tamsulosin  0.4 mg Oral Daily  . thiamine  100 mg Oral Daily  . vitamin B-12  1,000 mcg Oral Daily   Continuous Infusions: . sodium chloride       LOS: 2 days    Time spent: [place], MD [facility]  If 7PM-7AM, please contact night-coverage [email or web address] Password [facility] 08/04/2017, 10:48 AM

## 2017-08-04 NOTE — Procedures (Signed)
ELECTROENCEPHALOGRAM REPORT  Date of Study: 08/04/2017  Patient's Name: NUNZIO BANET MRN: [id number] Date of Birth: 1936/01/14  Referring Provider: Dr. Roxan Hockey  Clinical History: This is a 82 year old man with possible seizures.  Medications: Ativan  Technical Summary: A multichannel digital EEG recording measured by the international 10-20 system with electrodes applied with paste and impedances below 5000 ohms performed in our laboratory with EKG monitoring in an awake and asleep patient.  Hyperventilation and photic stimulation were not performed.  The digital EEG was referentially recorded, reformatted, and digitally filtered in a variety of bipolar and referential montages for optimal display.    Description: The patient is predominantly drowsy and asleep during the recording.  During brief period of wakefulness, there is a symmetric, medium voltage 8 Hz posterior dominant rhythm that attenuates with eye opening.  The record is symmetric.  During drowsiness and sleep, there is an increase in theta slowing of the background.  Vertex waves and symmetric sleep spindles were seen.  Hyperventilation and photic stimulation were not performed.  There were no epileptiform discharges or electrographic seizures seen.    EKG lead was unremarkable.  Impression: This predominantly drowsy and asleep EEG is normal.    Clinical Correlation: A normal EEG does not exclude a clinical diagnosis of epilepsy.  Clinical correlation is advised.   Ellouise Newer, M.D.

## 2017-08-04 NOTE — Progress Notes (Signed)
Patient confused, getting out of bed, pulling tubes, delusional, unsteady gait, staff continue to reinforce/reorient constantly, unable to leave remove because patient will not stay still, tried other diversional activity not effective,  call family to see if they able to come in but daughter stated she has been with him all day and not able to come back because she is so tired. haldol was given at 7pm but not effective as well,  Notified  C.Bodenheimer-NP again, order given for restraint. Patient's daughter [name] notified of the restraint and plan of care. Will continue to assess patient.

## 2017-08-04 NOTE — Plan of Care (Signed)
  Problem: Safety: Goal: Ability to remain free from injury will improve Outcome: Progressing   Problem: Skin Integrity: Goal: Risk for impaired skin integrity will decrease Outcome: Progressing   

## 2017-08-04 NOTE — Progress Notes (Signed)
EEG Completed; Results Pending  

## 2017-08-04 NOTE — Progress Notes (Signed)
CSW following to assist with patient's discharge to [facility] (memory care unit).   CSW spoke with [facility] staff member Amy, who confirmed patient's room at ALF in their memory care unit.  CSW spoke with patient's daughter/HCPOA [name] [phone]) who confirmed plan for patient to dc to [facility]. Patient's daughter reported that patient will need PTAR for transport to ALF. CSW and patient's daughter discussed patient needing to be restraint free for 24 hours to dc to facility, patient's daughter verbalized understanding.   Per chart review, patient in soft restraints. CSW spoke with patient's attending MD regarding patient's restraints, MD agreed to discontinue order. Patient's RN updated, patient's RN agreed to remove restraints. Patient will need to be restraint free for 24 hours to dc to facility.   CSW will continue to follow and assist with discharge planning.   Abundio Miu, [place] Social Worker [facility] Cell#: [phone]

## 2017-08-04 NOTE — Telephone Encounter (Signed)
Noted! Routing to Dr.Kwiatkowski. 

## 2017-08-04 NOTE — Telephone Encounter (Signed)
Noted  

## 2017-08-05 LAB — BASIC METABOLIC PANEL
BUN: 20 mg/dL (ref 6–20)
CO2: 25 mmol/L (ref 22–32)
Calcium: 8.6 mg/dL — ABNORMAL LOW (ref 8.9–10.3)
Chloride: 105 mmol/L (ref 101–111)
Creatinine, Ser: 1.27 mg/dL — ABNORMAL HIGH (ref 0.61–1.24)
GFR calc Af Amer: >60 mL/min (ref >60)
Glucose, Bld: 98 mg/dL (ref 65–99)

## 2017-08-05 LAB — BASIC METABOLIC PANEL WITH GFR
Anion gap: 10 (ref 5–15)
GFR calc non Af Amer: 52 mL/min — ABNORMAL LOW (ref >60)
Potassium: 3.7 mmol/L (ref 3.5–5.1)
Sodium: 140 mmol/L (ref 135–145)

## 2017-08-05 LAB — CBC
HCT: 32.9 % — ABNORMAL LOW (ref 39.0–52.0)
Hemoglobin: 11 g/dL — ABNORMAL LOW (ref 13.0–17.0)
MCH: 27.6 pg (ref 26.0–34.0)
MCHC: 33.4 g/dL (ref 30.0–36.0)
MCV: 82.5 fL (ref 78.0–100.0)
Platelets: 180 K/uL (ref 150–400)
RBC: 3.99 MIL/uL — ABNORMAL LOW (ref 4.22–5.81)
RDW: 15.4 % (ref 11.5–15.5)
WBC: 4.6 K/uL (ref 4.0–10.5)

## 2017-08-05 LAB — GLUCOSE, CAPILLARY
Glucose-Capillary: 131 mg/dL — ABNORMAL HIGH (ref 65–99)
Glucose-Capillary: 114 mg/dL — ABNORMAL HIGH (ref 65–99)

## 2017-08-05 LAB — MAGNESIUM: Magnesium: 1.8 mg/dL (ref 1.7–2.4)

## 2017-08-05 MED ORDER — IRBESARTAN 300 MG PO TABS: 300.0000 mg | ORAL_TABLET | Freq: Every day | ORAL | 0 refills | Status: DC

## 2017-08-05 MED ORDER — OLANZAPINE 5 MG PO TABS: 5.0000 mg | ORAL_TABLET | Freq: Every day | ORAL | 0 refills | Status: DC

## 2017-08-05 NOTE — Clinical Social Work Placement (Signed)
Patient received and accepted bed offer at [facility] (memory care unit). Facility aware of patient's discharge and confirmed patient's bed offer. PTAR contacted, patient's family notified. Patient's RN can call report to [phone], packet complete. CSW signing off, no other needs identified at this time.   CLINICAL SOCIAL WORK PLACEMENT  NOTE  Date:  08/05/2017  Patient Details  Name: Jonathon [name] MRN: [id number] Date of Birth: February 12, 1936  Clinical Social Work is seeking post-discharge placement for this patient at the [place] level of care (*CSW will initial, date and re-position this form in  chart as items are completed):  Yes   Patient/family provided with [place] Work Department's list of facilities offering this level of care within the geographic area requested by the patient (or if unable, by the patient's family).  Yes   Patient/family informed of their freedom to choose among providers that offer the needed level of care, that participate in Medicare, Medicaid or managed care program needed by the patient, have an available bed and are willing to accept the patient.  Yes   Patient/family informed of [place]'s ownership interest in [facility] and [facility], as well as of the fact that they are under no obligation to receive care at these facilities.  PASRR submitted to EDS on       PASRR number received on       Existing PASRR number confirmed on       FL2 transmitted to all facilities in geographic area requested by pt/family on       FL2 transmitted to all facilities within larger geographic area on       Patient informed that his/her managed care company has contracts with or will negotiate with certain facilities, including the following:        Yes   Patient/family informed of bed offers received.  Patient chooses bed at [facility]     Physician recommends and patient chooses bed at        Patient to be transferred to [facility] on 08/05/17.  Patient to be transferred to facility by PTAR     Patient family notified on 08/05/17 of transfer.  Name of family member notified:  [name]     PHYSICIAN       Additional Comment:    _______________________________________________ Burnis Medin, LCSW 08/05/2017, 2:09 PM

## 2017-08-05 NOTE — NC FL2 (Addendum)
[place] LEVEL OF CARE SCREENING TOOL     IDENTIFICATION  Patient Name: Jonathon [name] Birthdate: 1935/10/29 Sex: male Admission Date (Current Location): 08/01/2017  [facility] and [place] Number:  Herbalist and Address:  [facility],  [place] [address], [place]      Provider Number: [id number]  Attending Physician Name and Address:  Cristy Folks, MD  Relative Name and Phone Number:       Current Level of Care: Hospital Recommended Level of Care: Memory Care, [place] Prior Approval Number:    Date Approved/Denied:   PASRR Number:    Discharge Plan: Other (Comment)(Assisted Living Facility (memory care unit))    Current Diagnoses: Patient Active Problem List   Diagnosis Date Noted  . Dementia 08/04/2017  . Seizure ([place]) 08/01/2017  . Thiamine deficiency 05/27/2017  . B12 deficiency 04/30/2017  . Alcoholism ([place]) 01/28/2017  . Acute encephalopathy 12/28/2016  . Hepatic steatosis 06/05/2016  . Physical deconditioning 06/05/2016  . Chronic diastolic congestive heart failure ([place]) 06/05/2016  . Orthostatic syncope   . Elevated liver function tests   . LOC (loss of consciousness) ([place]) 08/10/2014  . Syncope 08/10/2014  . 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 Sustained SVT ([place]) 07/27/2014  . 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 Abnormal LFTs 08/06/2013  . Iron deficiency anemia 04/19/2013  . Muscle weakness 01/13/2013  . External hemorrhoids 03/23/2011  . Symptomatic bradycardia 10/25/2008  . 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 BENIGN PROSTATIC HYPERTROPHY, HX OF 10/25/2008  . PACEMAKER, PERMANENT 10/25/2008  . Essential hypertension 11/21/2006  . BPH (benign prostatic hyperplasia) 11/21/2006  . History of colonic polyps 11/21/2006  . NEPHROLITHIASIS, HX OF 11/21/2006    Orientation RESPIRATION BLADDER Height & Weight     Self, Time, Situation, Place  Normal  Incontinent Weight: 172 lb 9.9 oz (78.3 kg) Height:  5\' 10"  (177.8 cm)  BEHAVIORAL SYMPTOMS/MOOD NEUROLOGICAL BOWEL NUTRITION STATUS      Continent Diet(no added table salt)  AMBULATORY STATUS COMMUNICATION OF NEEDS Skin   Limited Assist Verbally Normal                       Personal Care Assistance Level of Assistance  Bathing, Feeding, Dressing Bathing Assistance: Limited assistance Feeding assistance: Independent Dressing Assistance: Limited assistance     Functional Limitations Info  Sight, Hearing, Speech Sight Info: Adequate Hearing Info: Adequate Speech Info: Adequate    SPECIAL CARE FACTORS FREQUENCY  OT (By licensed OT), PT (By licensed PT)     PT Frequency: HHPT OT Frequency: HHOT            Contractures Contractures Info: Not present    Additional Factors Info  Code Status, Allergies Code Status Info: Full Code Allergies Info: NKA              Discharge Medications: Medication List    TAKE these medications   colchicine 0.6 MG tablet Day 1:Take 2 tablets (1.2mg ) by mouth.  Then take 1 tablet 1 hour later.  Day 2: take 1 tablet.   escitalopram 5 MG tablet Commonly known as:  LEXAPRO Take 1 tablet (5 mg total) by mouth daily.   feeding supplement (ENSURE ENLIVE) Liqd Take 237 mLs by mouth 2 (two) times daily between meals.   flecainide 150 MG tablet Commonly known as:  TAMBOCOR TAKE 1/2 TABLET BY MOUTH TWICE DAILY   furosemide 20 MG tablet Commonly known as:  LASIX Take  1 tablet (20 mg total) by mouth daily.   Garlic 7048 MG Caps Take 2,000 mg by mouth daily.   irbesartan 300 MG tablet Commonly known as:  AVAPRO TAKE 1 TABLET(300 MG) BY MOUTH DAILY What changed:  Another medication with the same name was added. Make sure you understand how and when to take each.   irbesartan 300 MG tablet Commonly known as:  AVAPRO Take 1 tablet (300 mg total) by mouth daily. Start taking on:  08/06/2017 What changed:  You were  already taking a medication with the same name, and this prescription was added. Make sure you understand how and when to take each.   metoprolol succinate 25 MG 24 hr tablet Commonly known as:  TOPROL-XL TAKE 1 TABLET(25 MG) BY MOUTH DAILY   OLANZapine 5 MG tablet Commonly known as:  ZYPREXA Take 1 tablet (5 mg total) by mouth at bedtime.   tamsulosin 0.4 MG Caps capsule Commonly known as:  FLOMAX TAKE 1 CAPSULE BY MOUTH EVERY DAY   traZODone 50 MG tablet Commonly known as:  DESYREL Take 1 tablet (50 mg total) by mouth at bedtime as needed for sleep.   vitamin B-12 1000 MCG tablet Commonly known as:  CYANOCOBALAMIN Take 1,000 mcg by mouth daily. What changed:  Another medication with the same name was removed. Continue taking this medication, and follow the directions you see here.   Vitamin D (Ergocalciferol) 50000 units Caps capsule Commonly known as:  DRISDOL Take 1 capsule (50,000 Units total) by mouth every 7 (seven) days.      Relevant Imaging Results:  Relevant Lab Results:   Additional Information [id number]  Burnis Medin, LCSW

## 2017-08-05 NOTE — Care Management Note (Signed)
Case Management Note  Patient Details  Name: Jonathon [name] MRN: [id number] Date of Birth: Oct 08, 1935  Subjective/Objective:  D/c to [facility]-CSW to manage d/c to ALF.Spoke to Amy @ facility-contract w/[facility] @ home for [employer] aware of d/c & HHC orders. No further CM needs.                  Action/Plan:d/c ALF w/HHC.   Expected Discharge Date:  08/04/17               Expected Discharge Plan:  [facility])  In-House Referral:  Clinical Social Work  Discharge planning Services  CM Consult  Post Acute Care Choice:    Choice offered to:  Patient, Adult Children, Sibling  DME Arranged:    DME Agency:     HH Arranged:  RN, PT, OT, Social Work [employer] Agency:  [facility] [facility] [employer]  Status of Service:  Completed, signed off  If discussed at [employer], dates discussed:    Additional Comments:  Dessa Phi, RN 08/05/2017, 9:28 AM

## 2017-08-05 NOTE — Progress Notes (Signed)
Report called to Amy at [facility]. PTAR at bedside for transportation to facility.  Patient ready for d/c.

## 2017-08-05 NOTE — Discharge Summary (Signed)
Physician Discharge Summary  Jonathon [name] XFG:[id number] DOB: 09/30/1935 DOA: 08/01/2017  PCP: Jonathon Lor, MD  Admit date: 08/01/2017 Discharge date: 08/05/2017  Admitted From: Home Disposition: Assisted living facility  Recommendations for Outpatient Follow-up:  1. Follow up with PCP in 1-2 weeks 2. Please obtain BMP/CBC in one week   Home Health: Yes Equipment/Devices: No  Discharge Condition: Stable CODE STATUS: Full Diet recommendation: Low sodium  Brief/Interim Summary:  #) Altered mental status: Patient was admitted with intermittent altered mental status.  This apparently has been going on for several months when patient has been living with daughter and she can no longer take care of him at home.  Patient did briefly require restraints overnight however has been without restraints for greater than 24 hours.  Patient was started on olanzapine as needed for agitation and olanzapine 5 mg at night.  Patient was discharged in stable condition.  #) Syncope versus seizure-like episode: Patient had an unresponsive episode while in the emergency department.  EP interrogation of pacemaker showed no evidence of arrhythmia.  EEG was normal and unchanged from prior.  Cardiac echo showed only grade 1 diastolic dysfunction.  Patient did not have any events on telemetry.  Patient has had these episodes before and has been extensively worked up and have been unremarkable.  Patient cannot receive MRI due to pacemaker not being MRI compatible.    #) Chronic inflammatory demyelinating polyneuropathy: Patient was admitted with increasing weakness and decreasing function at home.  Patient had been on IVIG as an outpatient however has been off of her several months on admission to the hospital.  Discussed with neurology over the phone and patient was given one-time dose of 400 mg of IVIG on 08/02/2017 and tolerated this well.  Physical therapy evaluated the patient and recommended home health PT  however the family opted to place the patient into an assisted living facility.  #) hypertension: Patient was continued on home losartan, metoprolol.  #) Sees SVT: Patient has a history of SVT.  He did not have any events while on telemetry here.  He was continued on his home flecainide dose.  #) Symptomatic bradycardia status post permanent pacemaker: Patient's pacemaker did not show any events when interrogated.  #) Benign prostatic hypertrophy: Patient was continued on tamsulosin  #) Possible B12/B1 deficiency: Patient was continued on thiamine and B12 supplementation  #) Gout: Patient was continued on as needed colchicine for flares  #) Psych: Patient was continued on home escitalopram dose.  Discharge Diagnoses:  Active Problems:   Essential hypertension   Symptomatic bradycardia   BPH (benign prostatic hyperplasia)   PACEMAKER, PERMANENT   Muscle weakness   Polyradiculoneuropathy (HCC)   Sustained SVT (HCC)   CIDP (chronic inflammatory demyelinating polyneuropathy) (HCC)   Syncope   Hepatic steatosis   Physical deconditioning   Acute encephalopathy   Alcoholism ([place])   B12 deficiency   Thiamine deficiency   Seizure ([place])   Dementia    Discharge Instructions  Discharge Instructions    Call MD for:  difficulty breathing, headache or visual disturbances   Complete by:  As directed    Call MD for:  persistant nausea and vomiting   Complete by:  As directed    Call MD for:  persistant nausea and vomiting   Complete by:  As directed    Call MD for:  redness, tenderness, or signs of infection (pain, swelling, redness, odor or green/yellow discharge around incision site)   Complete by:  As directed    Call MD for:  redness, tenderness, or signs of infection (pain, swelling, redness, odor or green/yellow discharge around incision site)   Complete by:  As directed    Call MD for:  severe uncontrolled pain   Complete by:  As directed    Call MD for:  severe  uncontrolled pain   Complete by:  As directed    Call MD for:  temperature >100.4   Complete by:  As directed    Call MD for:  temperature >100.4   Complete by:  As directed    Diet - low sodium heart healthy   Complete by:  As directed    Diet - low sodium heart healthy   Complete by:  As directed    Discharge instructions   Complete by:  As directed    Please follow-up with your primary care doctor in 1 week.  Please follow-up with neurology as an outpatient to see if he would continue to benefit from treatment of CIDP.   Discharge instructions   Complete by:  As directed    Please follow-up with your PCP in 1-2 weeks.  Please follow-up with your neurologist in 1-2 weeks.   Face-to-face encounter (required for Medicare/Medicaid patients)   Complete by:  As directed    I [name] certify that this patient is under my care and that I, or a nurse practitioner or physician's assistant working with me, had a face-to-face encounter that meets the physician face-to-face encounter requirements with this patient on 08/01/2017. The encounter with the patient was in whole, or in part for the following medical condition(s) which is the primary reason for home health care (List medical condition):  Confusion  Further orders from Dr Burnice Logan.   The encounter with the patient was in whole, or in part, for the following medical condition, which is the primary reason for home health care:  confusion   I certify that, based on my findings, the following services are medically necessary home health services:  Nursing   Reason for Medically Necessary Home Health Services:   Skilled Nursing- Change/Decline in Patient Status Skilled Nursing- Skilled Assessment/Observation     My clinical findings support the need for the above services:  Cognitive impairments, dementia, or mental confusion  that make it unsafe to leave home   Further, I certify that my clinical findings support that this patient is  homebound due to:  Mental confusion   Home Health   Complete by:  As directed    To provide the following care/treatments:   Social work [place]     Increase activity slowly   Complete by:  As directed    Increase activity slowly   Complete by:  As directed      Allergies as of 08/05/2017   No Known Allergies     Medication List    TAKE these medications   colchicine 0.6 MG tablet Day 1:Take 2 tablets (1.2mg ) by mouth.  Then take 1 tablet 1 hour later.  Day 2: take 1 tablet.   escitalopram 5 MG tablet Commonly known as:  LEXAPRO Take 1 tablet (5 mg total) by mouth daily.   feeding supplement (ENSURE ENLIVE) Liqd Take 237 mLs by mouth 2 (two) times daily between meals.   flecainide 150 MG tablet Commonly known as:  TAMBOCOR TAKE 1/2 TABLET BY MOUTH TWICE DAILY   furosemide 20 MG tablet Commonly known as:  LASIX Take 1 tablet (20 mg total)  by mouth daily.   Garlic 4034 MG Caps Take 2,000 mg by mouth daily.   irbesartan 300 MG tablet Commonly known as:  AVAPRO TAKE 1 TABLET(300 MG) BY MOUTH DAILY What changed:  Another medication with the same name was added. Make sure you understand how and when to take each.   irbesartan 300 MG tablet Commonly known as:  AVAPRO Take 1 tablet (300 mg total) by mouth daily. Start taking on:  08/06/2017 What changed:  You were already taking a medication with the same name, and this prescription was added. Make sure you understand how and when to take each.   metoprolol succinate 25 MG 24 hr tablet Commonly known as:  TOPROL-XL TAKE 1 TABLET(25 MG) BY MOUTH DAILY   OLANZapine 5 MG tablet Commonly known as:  ZYPREXA Take 1 tablet (5 mg total) by mouth at bedtime.   tamsulosin 0.4 MG Caps capsule Commonly known as:  FLOMAX TAKE 1 CAPSULE BY MOUTH EVERY DAY   traZODone 50 MG tablet Commonly known as:  DESYREL Take 1 tablet (50 mg total) by mouth at bedtime as needed for sleep.   vitamin B-12 1000 MCG tablet Commonly  known as:  CYANOCOBALAMIN Take 1,000 mcg by mouth daily. What changed:  Another medication with the same name was removed. Continue taking this medication, and follow the directions you see here.   Vitamin D (Ergocalciferol) 50000 units Caps capsule Commonly known as:  DRISDOL Take 1 capsule (50,000 Units total) by mouth every 7 (seven) days.      Follow-up Information    Jonathon Lor, MD.   Specialty:  Internal Medicine Contact information: [place] [address] [phone]        [facility] Follow up.   Specialty:  Home Health Services Why:  [facility] nursing,physica/occupational therapy, aide,social worker Contact information: [place]  [address] [phone]          No Known Allergies  Consultations:  EP   Procedures/Studies: Dg Chest 2 View  Result Date: 08/01/2017 CLINICAL DATA:  Altered mental status. EXAM: CHEST - 2 VIEW COMPARISON:  12/28/2016 FINDINGS: Dual lead cardiac pacemaker in stable position. Cardiomediastinal silhouette is normal. Mediastinal contours appear intact. Tortuosity and calcific atherosclerotic disease of the aorta. There is no evidence of focal airspace consolidation, pleural effusion or pneumothorax. Osseous structures are without acute abnormality. Soft tissues are grossly normal. IMPRESSION: No active cardiopulmonary disease. Electronically Signed   By: Fidela Salisbury M.D.   On: 08/01/2017 11:00   Ct Head Wo Contrast  Result Date: 08/01/2017 CLINICAL DATA:  Seizure.  Altered mental status. EXAM: CT HEAD WITHOUT CONTRAST TECHNIQUE: Contiguous axial images were obtained from the base of the skull through the vertex without intravenous contrast. COMPARISON:  12/28/2016 FINDINGS: Brain: Ventricles, cisterns and other CSF spaces are normal. There is mild chronic ischemic microvascular disease. There is no mass, mass effect, shift of midline structures or acute hemorrhage. There is  no evidence of acute infarction. Vascular: No hyperdense vessel or unexpected calcification. Skull: Normal. Negative for fracture or focal lesion. Sinuses/Orbits: No acute finding. Other: None. IMPRESSION: No acute findings. Mild chronic ischemic microvascular disease. Electronically Signed   By: Marin Olp M.D.   On: 08/01/2017 16:10   Pacer interrogation on 08/02/2017 shows no evidence of arrhythmias Echo 08/02/2017:- Left ventricle: The cavity size was normal. There was mild concentric hypertrophy. Systolic function was vigorous. The estimated ejection fraction was in the range of 65%  to 70%. Wall motion was normal; there were no regional wall motion abnormalities. Doppler parameters are consistent with abnormal left ventricular relaxation (grade 1 diastolic dysfunction). There was no evidence of elevated ventricular filling pressure by Doppler parameters. - Aortic valve: Trileaflet; moderately thickened, moderately calcified leaflets. Transvalvular velocity was within the normal range. There was no stenosis. There was no regurgitation. - Mitral valve: There was trivial regurgitation. - Right ventricle: Pacer wire or catheter noted in right ventricle. Systolic function was normal. - Right atrium: Pacer wire or catheter noted in right atrium. - Tricuspid valve: There was mild regurgitation. - Inferior vena cava: The vessel was normal in size.  - Pericardium, extracardiac: There was no pericardial effusion.   08/04/2017 EEG: No events    Subjective:   Discharge Exam: Vitals:   08/05/17 0500 08/05/17 0930  BP: 127/65 (!) 154/84  Pulse: 65 86  Resp: 18   Temp: 98.3 F (36.8 C)   SpO2: 100%    Vitals:   08/04/17 1634 08/04/17 2017 08/05/17 0500 08/05/17 0930  BP: 114/65 111/67 127/65 (!) 154/84  Pulse: 80 76 65 86  Resp:  18 18   Temp: 99.2 F (37.3 C) 99 F (37.2 C) 98.3 F (36.8 C)   TempSrc: Oral Oral Oral   SpO2: 100% 100% 100%   Weight:       Height:        General exam: Sleeping in bed Respiratory system: Clear to auscultation. Respiratory effort normal. Cardiovascular system: Regular rate and rhythm, no murmurs, permanent pacemaker site is clean dry and intact Gastrointestinal system: Abdomen is nondistended, soft and nontender. No organomegaly or masses felt. Normal bowel sounds heard. Central nervous system: Alert to self and place but not oriented to situation.  Bilateral lower extremity weakness and large extensor and flexor muscles of lower extremity, worse on right, large leg muscle atrophy noted Extremities: No lower extremity edema Skin: No rashes on visible skin Psychiatry:  Patient's judgment and insight appear to be poor.  His mood appears to be depressed.      The results of significant diagnostics from this hospitalization (including imaging, microbiology, ancillary and laboratory) are listed below for reference.     Microbiology: No results found for this or any previous visit (from the past 240 hour(s)).   Labs: BNP (last 3 results) No results for input(s): BNP in the last 8760 hours. Basic Metabolic Panel: Recent Labs  Lab 08/01/17 1058 08/02/17 0500 08/03/17 0436 08/05/17 0542  NA 135 138 136 140  K 3.9 3.9 3.9 3.7  CL 100* 105 102 105  CO2 24 23 25 25   GLUCOSE 92 84 106* 98  BUN 21* 17 16 20   CREATININE 1.05 1.00 0.95 1.27*  CALCIUM 9.3 8.9 9.0 8.6*  MG  --   --  1.7 1.8   Liver Function Tests: Recent Labs  Lab 08/01/17 1058  AST 54*  ALT 36  ALKPHOS 96  BILITOT 1.3*  PROT 7.7  ALBUMIN 3.8   No results for input(s): LIPASE, AMYLASE in the last 168 hours. Recent Labs  Lab 08/01/17 1058  AMMONIA 19   CBC: Recent Labs  Lab 08/01/17 1058 08/02/17 0500 08/03/17 0436 08/05/17 0542  WBC 6.1 5.1 6.0 4.6  NEUTROABS 3.1  --   --   --   HGB 12.8* 10.8* 11.5* 11.0*  HCT 37.8* 32.5* 34.2* 32.9*  MCV 80.8 81.9 81.2 82.5  PLT 194 174 178 180   Cardiac Enzymes: Recent Labs   Lab  08/01/17 1058 08/01/17 2207 08/02/17 0500  TROPONINI <0.03 0.05* 0.05*   BNP: Invalid input(s): POCBNP CBG: Recent Labs  Lab 08/03/17 1936 08/04/17 0729 08/04/17 1136 08/04/17 1613 08/05/17 0721  GLUCAP 104* 113* 101* 104* 131*   D-Dimer No results for input(s): DDIMER in the last 72 hours. Hgb A1c No results for input(s): HGBA1C in the last 72 hours. Lipid Profile No results for input(s): CHOL, HDL, LDLCALC, TRIG, CHOLHDL, LDLDIRECT in the last 72 hours. Thyroid function studies No results for input(s): TSH, T4TOTAL, T3FREE, THYROIDAB in the last 72 hours.  Invalid input(s): FREET3 Anemia work up No results for input(s): VITAMINB12, FOLATE, FERRITIN, TIBC, IRON, RETICCTPCT in the last 72 hours. Urinalysis    Component Value Date/Time   COLORURINE YELLOW 08/01/2017 1119   APPEARANCEUR CLEAR 08/01/2017 1119   LABSPEC 1.010 08/01/2017 1119   PHURINE 6.0 08/01/2017 1119   GLUCOSEU NEGATIVE 08/01/2017 1119   HGBUR SMALL (A) 08/01/2017 1119   BILIRUBINUR NEGATIVE 08/01/2017 1119   KETONESUR 5 (A) 08/01/2017 1119   PROTEINUR 30 (A) 08/01/2017 1119   UROBILINOGEN 1.0 08/10/2014 1653   NITRITE NEGATIVE 08/01/2017 1119   LEUKOCYTESUR NEGATIVE 08/01/2017 1119   Sepsis Labs Invalid input(s): PROCALCITONIN,  WBC,  LACTICIDVEN Microbiology No results found for this or any previous visit (from the past 240 hour(s)).   Time coordinating discharge: Over 30 minutes  SIGNED:   Cristy Folks, MD  [facility] 08/05/2017, 11:27 AM   If 7PM-7AM, please contact night-coverage [email or web address] Password [email or web address]

## 2017-08-06 DIAGNOSIS — I5032 Chronic diastolic (congestive) heart failure: Secondary | ICD-10-CM | POA: Diagnosis not present

## 2017-08-06 DIAGNOSIS — I110 Hypertensive heart disease with heart failure: Secondary | ICD-10-CM | POA: Diagnosis not present

## 2017-08-06 DIAGNOSIS — G6181 Chronic inflammatory demyelinating polyneuritis: Secondary | ICD-10-CM | POA: Diagnosis not present

## 2017-08-06 DIAGNOSIS — I951 Orthostatic hypotension: Secondary | ICD-10-CM | POA: Diagnosis not present

## 2017-08-06 DIAGNOSIS — K760 Fatty (change of) liver, not elsewhere classified: Secondary | ICD-10-CM | POA: Diagnosis not present

## 2017-08-06 DIAGNOSIS — I471 Supraventricular tachycardia: Secondary | ICD-10-CM | POA: Diagnosis not present

## 2017-08-08 ENCOUNTER — Emergency Department (HOSPITAL_COMMUNITY): Payer: Medicare Other

## 2017-08-08 ENCOUNTER — Telehealth: Payer: Self-pay | Admitting: Internal Medicine

## 2017-08-08 ENCOUNTER — Emergency Department (HOSPITAL_COMMUNITY)
Admission: EM | Admit: 2017-08-08 | Discharge: 2017-08-09 | Disposition: A | Discharge status: Home / Self Care | Payer: Medicare Other | Source: Home / Self Care | Attending: Emergency Medicine | Admitting: Emergency Medicine

## 2017-08-08 ENCOUNTER — Encounter (HOSPITAL_COMMUNITY): Payer: Self-pay | Admitting: Emergency Medicine

## 2017-08-08 DIAGNOSIS — F0390 Unspecified dementia without behavioral disturbance: Secondary | ICD-10-CM | POA: Insufficient documentation

## 2017-08-08 DIAGNOSIS — T887XXA Unspecified adverse effect of drug or medicament, initial encounter: Secondary | ICD-10-CM

## 2017-08-08 DIAGNOSIS — I5032 Chronic diastolic (congestive) heart failure: Secondary | ICD-10-CM | POA: Insufficient documentation

## 2017-08-08 DIAGNOSIS — Z79899 Other long term (current) drug therapy: Secondary | ICD-10-CM

## 2017-08-08 DIAGNOSIS — R410 Disorientation, unspecified: Secondary | ICD-10-CM | POA: Diagnosis not present

## 2017-08-08 DIAGNOSIS — N179 Acute kidney failure, unspecified: Secondary | ICD-10-CM | POA: Diagnosis not present

## 2017-08-08 DIAGNOSIS — I82611 Acute embolism and thrombosis of superficial veins of right upper extremity: Secondary | ICD-10-CM | POA: Diagnosis not present

## 2017-08-08 DIAGNOSIS — R404 Transient alteration of awareness: Secondary | ICD-10-CM | POA: Diagnosis not present

## 2017-08-08 DIAGNOSIS — G6181 Chronic inflammatory demyelinating polyneuritis: Secondary | ICD-10-CM | POA: Diagnosis not present

## 2017-08-08 DIAGNOSIS — Y829 Unspecified medical devices associated with adverse incidents: Secondary | ICD-10-CM

## 2017-08-08 DIAGNOSIS — I471 Supraventricular tachycardia: Secondary | ICD-10-CM | POA: Diagnosis not present

## 2017-08-08 DIAGNOSIS — G92 Toxic encephalopathy: Secondary | ICD-10-CM | POA: Diagnosis not present

## 2017-08-08 DIAGNOSIS — I10 Essential (primary) hypertension: Secondary | ICD-10-CM | POA: Diagnosis not present

## 2017-08-08 DIAGNOSIS — I110 Hypertensive heart disease with heart failure: Secondary | ICD-10-CM | POA: Insufficient documentation

## 2017-08-08 DIAGNOSIS — T43595A Adverse effect of other antipsychotics and neuroleptics, initial encounter: Secondary | ICD-10-CM | POA: Diagnosis not present

## 2017-08-08 DIAGNOSIS — Z950 Presence of cardiac pacemaker: Secondary | ICD-10-CM

## 2017-08-08 DIAGNOSIS — T50905A Adverse effect of unspecified drugs, medicaments and biological substances, initial encounter: Secondary | ICD-10-CM

## 2017-08-08 DIAGNOSIS — R4182 Altered mental status, unspecified: Secondary | ICD-10-CM

## 2017-08-08 DIAGNOSIS — R509 Fever, unspecified: Secondary | ICD-10-CM | POA: Diagnosis not present

## 2017-08-08 DIAGNOSIS — Z87891 Personal history of nicotine dependence: Secondary | ICD-10-CM

## 2017-08-08 DIAGNOSIS — R05 Cough: Secondary | ICD-10-CM | POA: Diagnosis not present

## 2017-08-08 DIAGNOSIS — J45909 Unspecified asthma, uncomplicated: Secondary | ICD-10-CM | POA: Insufficient documentation

## 2017-08-08 DIAGNOSIS — R6889 Other general symptoms and signs: Secondary | ICD-10-CM | POA: Diagnosis not present

## 2017-08-08 LAB — AMMONIA: Ammonia: 14 umol/L (ref 9–35)

## 2017-08-08 LAB — COMPREHENSIVE METABOLIC PANEL
ALK PHOS: 76 U/L (ref 38–126)
ALT: 36 U/L (ref 17–63)
ANION GAP: 14 (ref 5–15)
AST: 90 U/L — ABNORMAL HIGH (ref 15–41)
Albumin: 3.3 g/dL — ABNORMAL LOW (ref 3.5–5.0)
BUN: 25 mg/dL — ABNORMAL HIGH (ref 6–20)
CALCIUM: 9.3 mg/dL (ref 8.9–10.3)
CO2: 22 mmol/L (ref 22–32)
CREATININE: 1.17 mg/dL (ref 0.61–1.24)
Chloride: 102 mmol/L (ref 101–111)
GFR calc Af Amer: >60 mL/min (ref >60)
GFR, EST NON AFRICAN AMERICAN: 57 mL/min — AB (ref >60)
Glucose, Bld: 96 mg/dL (ref 65–99)
Potassium: 4.8 mmol/L (ref 3.5–5.1)
Sodium: 138 mmol/L (ref 135–145)
TOTAL PROTEIN: 8.1 g/dL (ref 6.5–8.1)
Total Bilirubin: 1.3 mg/dL — ABNORMAL HIGH (ref 0.3–1.2)

## 2017-08-08 LAB — CBC
HCT: 36.9 % — ABNORMAL LOW (ref 39.0–52.0)
HEMOGLOBIN: 12.6 g/dL — AB (ref 13.0–17.0)
MCH: 28.3 pg (ref 26.0–34.0)
MCHC: 34.1 g/dL (ref 30.0–36.0)
MCV: 82.7 fL (ref 78.0–100.0)
PLATELETS: 229 10*3/uL (ref 150–400)
RBC: 4.46 MIL/uL (ref 4.22–5.81)
RDW: 15.2 % (ref 11.5–15.5)
WBC: 7.2 10*3/uL (ref 4.0–10.5)

## 2017-08-08 LAB — URINALYSIS, ROUTINE W REFLEX MICROSCOPIC
BILIRUBIN URINE: NEGATIVE
Bacteria, UA: NONE SEEN
GLUCOSE, UA: NEGATIVE mg/dL
KETONES UR: 5 mg/dL — AB
LEUKOCYTES UA: NEGATIVE
NITRITE: NEGATIVE
PH: 5 (ref 5.0–8.0)
Protein, ur: 30 mg/dL — AB
Specific Gravity, Urine: 1.019 (ref 1.005–1.030)
Squamous Epithelial / LPF: NONE SEEN

## 2017-08-08 LAB — CBG MONITORING, ED: Glucose-Capillary: 66 mg/dL (ref 65–99)

## 2017-08-08 LAB — ETHANOL: Alcohol, Ethyl (B): <10 mg/dL (ref <10)

## 2017-08-08 LAB — I-STAT TROPONIN, ED: Troponin i, poc: 0 ng/mL (ref 0.00–0.08)

## 2017-08-08 MED ORDER — METOPROLOL TARTRATE 5 MG/5ML IV SOLN
5.0000 mg | Freq: Once | INTRAVENOUS | Status: AC
  Administered 2017-08-09: 5 mg via INTRAVENOUS
  Filled 2017-08-08: qty 5

## 2017-08-08 MED ORDER — DIPHENHYDRAMINE HCL 50 MG/ML IJ SOLN
12.5000 mg | Freq: Once | INTRAMUSCULAR | Status: AC
  Administered 2017-08-09: 12.5 mg via INTRAVENOUS
  Filled 2017-08-08: qty 1

## 2017-08-08 MED ORDER — METOPROLOL TARTRATE 25 MG PO TABS
25.0000 mg | ORAL_TABLET | Freq: Once | ORAL | Status: DC
  Filled 2017-08-08: qty 1

## 2017-08-08 NOTE — Telephone Encounter (Signed)
Error

## 2017-08-08 NOTE — Telephone Encounter (Signed)
Pt's daughter calling back again for advise on her dad. He is now at [facility] and is very lethargic. She has spoke with the nurse at [facility]. And she is calling his PCP for advise she got very upset when I told her what I was advised which was to talk to a nurse at the facility. She is also very upset no one returned her call from 08/04/17. She stated they would just taken him to the emergency room and hung up the phone.

## 2017-08-08 NOTE — ED Provider Notes (Signed)
[facility] EMERGENCY DEPARTMENT Provider Note   CSN: [id number] Arrival date & time: 08/08/17  1846     History   Chief Complaint Chief Complaint  Patient presents with  . Altered Mental Status    HPI Jonathon [name] is a 82 y.o. male with a past medical history of hypertension, pacemaker placement, who presents to ED for possible altered mental status since this morning.  History is limited due to patient's altered mental status.  Most of history is provided by relatives in the room.  They state that when the CNA at the assisted living facility that he resides at what to feed him his morning meal and medications he refused to take them.  They note that he has been speaking nonsensical sentences and been paranoid since September 2018.  He was hospitalized about a week ago for similar symptoms and was discharged home with new antipsychotic medication and diagnosis of dementia.  He has been residing in an assisted living facility since then.  No recent medication adjustments yesterday.  They state that yesterday he was eating and drinking fine and he is usually ambulatory with normal gait.  HPI  Past Medical History:  Diagnosis Date  . Asthma   . BENIGN PROSTATIC HYPERTROPHY, HX OF 10/25/2008  . BRADYCARDIA 2005  . Chronic diastolic congestive heart failure ([place]) 06/05/2016  . CIDP (chronic inflammatory demyelinating polyneuropathy) ([place])   . HYPERTENSION 11/21/2006  . Iron deficiency anemia, unspecified 04/19/2013  . 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 PACEMAKER, PERMANENT 2005   Gen change 2014 Medtronic Adaptic L dual-chamber pacemaker, serial [id number]   . SVT (supraventricular tachycardia) ([place])   . TOBACCO ABUSE 10/25/2008   Quit 2012    Patient Active Problem List   Diagnosis Date Noted  . Dementia 08/04/2017  . Seizure ([place]) 08/01/2017  . Thiamine deficiency 05/27/2017  . B12 deficiency 04/30/2017  . Alcoholism ([place])  01/28/2017  . Acute encephalopathy 12/28/2016  . Hepatic steatosis 06/05/2016  . Physical deconditioning 06/05/2016  . Chronic diastolic congestive heart failure ([place]) 06/05/2016  . Orthostatic syncope   . Elevated liver function tests   . LOC (loss of consciousness) ([place]) 08/10/2014  . Syncope 08/10/2014  . CIDP (chronic inflammatory demyelinating polyneuropathy) ([place]) 08/08/2014  . Sustained SVT ([place]) 07/27/2014  . 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 Abnormal LFTs 08/06/2013  . Iron deficiency anemia 04/19/2013  . Muscle weakness 01/13/2013  . External hemorrhoids 03/23/2011  . Symptomatic bradycardia 10/25/2008  . 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 BENIGN PROSTATIC HYPERTROPHY, HX OF 10/25/2008  . PACEMAKER, PERMANENT 10/25/2008  . Essential hypertension 11/21/2006  . BPH (benign prostatic hyperplasia) 11/21/2006  . History of colonic polyps 11/21/2006  . NEPHROLITHIASIS, HX OF 11/21/2006    Past Surgical History:  Procedure Laterality Date  . COLONOSCOPY  [id number]  . PACEMAKER INSERTION  2005  . PERMANENT PACEMAKER GENERATOR CHANGE N/A 06/19/2012   Procedure: PERMANENT PACEMAKER GENERATOR CHANGE;  Surgeon: [name], MD; Medtronic Adaptic L dual-chamber pacemaker, serial #[id number]    . SKIN GRAFT Right 1962   wrist        Home Medications    Prior to Admission medications   Medication Sig Start Date End Date Taking? Authorizing Provider  colchicine 0.6 MG tablet Day 1:Take 2 tablets (1.2mg ) by mouth.  Then take 1 tablet 1 hour later.  Day 2: take 1 tablet. Patient taking differently: Take 0.6 mg by mouth daily as needed (gout pain).  01/28/17  Yes  Marletta Lor, MD  escitalopram (LEXAPRO) 5 MG tablet Take 1 tablet (5 mg total) by mouth daily. 07/31/17  Yes Marletta Lor, MD  feeding supplement, ENSURE ENLIVE, (ENSURE ENLIVE) LIQD Take 237 mLs by mouth 2 (two) times daily between meals. 06/07/16  Yes Florencia Reasons, MD  flecainide [facility]) 150 MG  tablet TAKE 1/2 TABLET BY MOUTH TWICE DAILY 07/23/17  Yes [name], MD  furosemide (LASIX) 20 MG tablet Take 1 tablet (20 mg total) by mouth daily. 04/30/17  Yes Marletta Lor, MD  Garlic 7106 MG CAPS Take 2,000 mg by mouth daily.    Yes [provider]  irbesartan (AVAPRO) 300 MG tablet TAKE 1 TABLET(300 MG) BY MOUTH DAILY 03/18/17  Yes [name], MD  metoprolol succinate (TOPROL-XL) 25 MG 24 hr tablet TAKE 1 TABLET(25 MG) BY MOUTH DAILY 03/03/17  Yes [name], MD  OLANZapine (ZYPREXA) 5 MG tablet Take 1 tablet (5 mg total) by mouth at bedtime. 08/05/17  Yes Purohit, Konrad Dolores, MD  tamsulosin (FLOMAX) 0.4 MG CAPS capsule TAKE 1 CAPSULE BY MOUTH EVERY DAY 06/11/17  Yes Marletta Lor, MD  traZODone (DESYREL) 50 MG tablet Take 1 tablet (50 mg total) by mouth at bedtime as needed for sleep. 08/04/17  Yes Purohit, Konrad Dolores, MD  vitamin B-12 (CYANOCOBALAMIN) 1000 MCG tablet Take 1,000 mcg by mouth daily.   Yes [provider]  Vitamin D, Ergocalciferol, (DRISDOL) 50000 units CAPS capsule Take 1 capsule (50,000 Units total) by mouth every 7 (seven) days. 09/21/15  Yes Lyndal Pulley, DO    Family History Family History  Problem Relation Age of Onset  . Heart attack Father        Died, 61  . Kidney disease Father   . Parkinson's disease Mother        Died, 15  . Breast cancer Sister        Living, 67  . Diabetes type II Brother        Living, 35  . Breast cancer Sister        Living, 93  . Diabetes Daughter        Living, 62  . Diabetes Son        Living, 33  . Ovarian cancer Daughter   . Colon cancer Neg Hx   . Rectal cancer Neg Hx   . Stomach cancer Neg Hx     Social History Social History   Tobacco Use  . Smoking status: Former Smoker    Packs/day: 0.25    Years: 46.00    Pack years: 11.50    Types: Cigarettes    Start date: 03/16/1965    Last attempt to quit: 06/15/2010    Years since quitting: 7.1  . Smokeless tobacco: Never Used   . Tobacco comment: quit 3 years ago  Substance Use Topics  . Alcohol use: No    Alcohol/week: 0.0 oz    Comment: 4 beers per day  . Drug use: No     Allergies   Patient has no known allergies.   Review of Systems Review of Systems  Unable to perform ROS: Mental status change     Physical Exam Updated Vital Signs BP (!) 176/89   Pulse 92   Temp 99.6 F (37.6 C) (Rectal)   Resp 19   Ht 5\' 11"  (1.803 m)   Wt 81.2 kg (179 lb)   SpO2 100%   BMI 24.97 kg/m   Physical  Exam  Constitutional: He appears well-developed and well-nourished. No distress.  HENT:  Head: Normocephalic and atraumatic.  Nose: Nose normal.  Eyes: Conjunctivae and EOM are normal. Left eye exhibits no discharge. No scleral icterus.  Neck: Normal range of motion. Neck supple.  Cardiovascular: Normal rate, regular rhythm, normal heart sounds and intact distal pulses. Exam reveals no gallop and no friction rub.  No murmur heard. Pulmonary/Chest: Effort normal and breath sounds normal. No respiratory distress.  Abdominal: Soft. Bowel sounds are normal. He exhibits no distension. There is no tenderness. There is no guarding.  Musculoskeletal: Normal range of motion. He exhibits no edema.  Patient is contracting extremities when trying to pull them forward.  Refusing to open eyes even with force.  Neurological: He is alert. He exhibits normal muscle tone. Coordination normal.  Skin: Skin is warm and dry. No rash noted.  Psychiatric: He has a normal mood and affect.  Nursing note and vitals reviewed.    ED Treatments / Results  Labs (all labs ordered are listed, but only abnormal results are displayed) Labs Reviewed  COMPREHENSIVE METABOLIC PANEL - Abnormal; Notable for the following components:      Result Value   BUN 25 (*)    Albumin 3.3 (*)    AST 90 (*)    Total Bilirubin 1.3 (*)    GFR calc non Af Amer 57 (*)    All other components within normal limits  CBC - Abnormal; Notable for the  following components:   Hemoglobin 12.6 (*)    HCT 36.9 (*)    All other components within normal limits  URINALYSIS, ROUTINE W REFLEX MICROSCOPIC - Abnormal; Notable for the following components:   Hgb urine dipstick SMALL (*)    Ketones, ur 5 (*)    Protein, ur 30 (*)    All other components within normal limits  URINE CULTURE  ETHANOL  AMMONIA  CBG MONITORING, ED  CBG MONITORING, ED  I-STAT TROPONIN, ED    EKG EKG Interpretation  Date/Time:  Friday August 08 2017 18:51:56 EDT Ventricular Rate:  90 PR Interval:    QRS Duration: 151 QT Interval:  390 QTC Calculation: 478 R Axis:   56 Text Interpretation:  Sinus rhythm Right bundle branch block No significant change since last tracing Confirmed by Deno Etienne [phone]) on 08/08/2017 6:54:05 PM   Radiology Dg Chest 1 View  Result Date: 08/08/2017 CLINICAL DATA:  Altered mental status EXAM: CHEST  1 VIEW COMPARISON:  08/01/2017 FINDINGS: Left-sided pacing device as before. No consolidation or effusion. Stable cardiomediastinal silhouette. No pneumothorax. IMPRESSION: No active disease. Electronically Signed   By: Donavan Foil M.D.   On: 08/08/2017 20:10   Ct Head Wo Contrast  Result Date: 08/08/2017 CLINICAL DATA:  Altered mental status.  Found down. EXAM: CT HEAD WITHOUT CONTRAST TECHNIQUE: Contiguous axial images were obtained from the base of the skull through the vertex without intravenous contrast. COMPARISON:  08/01/2017 FINDINGS: Brain: No mass lesion, intraparenchymal hemorrhage or extra-axial collection. No evidence of acute cortical infarct. Normal appearance of the brain parenchyma and extra axial spaces for age. Vascular: No hyperdense vessel or unexpected vascular calcification. Skull: Normal visualized skull base, calvarium and extracranial soft tissues. Sinuses/Orbits: No sinus fluid levels or advanced mucosal thickening. No mastoid effusion. Chronic fracture of the right lamina papyracea. IMPRESSION: Normal aging brain.  Electronically Signed   By: Ulyses Jarred M.D.   On: 08/08/2017 20:08    Procedures Procedures (including critical care time)  Medications  Ordered in ED Medications  diphenhydrAMINE (BENADRYL) injection 12.5 mg (12.5 mg Intravenous Given 08/09/17 0003)  metoprolol tartrate (LOPRESSOR) injection 5 mg (5 mg Intravenous Given 08/09/17 0004)     Initial Impression / Assessment and Plan / ED Course  I have reviewed the triage vital signs and the nursing notes.  Pertinent labs & imaging results that were available during my care of the patient were reviewed by me and considered in my medical decision making (see chart for details).     Patient presents to ED for evaluation of possible altered mental status since this morning.  He resides at [facility] living facility and when the CNA went to give him his medications this morning he refused to take them.  They state that he is refusing to move at all.  Per family, he has been speaking nonsensical sentences and has been paranoid since September 2018.  He was recently started on olanzapine for the symptoms and he has been on it for 5 days.  Physical exam patient is alert but refusing to complete any commands.  He resists any type of movement of his extremities and his eyes.  Lab work including CBC, CMP, urinalysis, ethanol, ammonia, troponin, EKG unremarkable.  Chest x-ray negative.  Head CT negative.  He is afebrile.  He is hypertensive because he is not taking his medication this morning.  Try to give patient blood pressure medication but he refused to take it.  I spoke to the hospitalist who does not feel that inpatient admission is warranted at this time.  I spoke to the family and told him that we can either consult TTS for possible psychiatric evaluation or give the patient Benadryl, discontinue his olanzapine and have him follow-up with his primary care provider and returning to the assisted living facility.  They are agreeable with the plan of giving  him medications here and having him follow-up outpatient.  Blood pressure has improved with IV metoprolol however his condition is essentially unchanged with the Benadryl.  Family declined TTS consult at this time. Patient discussed with and seen by my attending Dr. Tyrone Nine.  Portions of this note were generated with [employer]. Dictation errors may occur despite best attempts at proofreading.   Final Clinical Impressions(s) / ED Diagnoses   Final diagnoses:  Drug reaction, initial encounter    ED Discharge Orders    None       Delia Heady, PA-C 08/09/17 0026    Deno Etienne, DO 08/12/17 1509

## 2017-08-08 NOTE — ED Triage Notes (Signed)
Per EMS pt is from [facility] assisted living.  His last scene normal was last night 9pm. They found him this morning on the floor with no visible trauma.  They called EMS because he seemed more altered.  Pt well tense up when you touch and pull his extremities.  He keeps his eyes close and will not open eyes.  NAD noted at this time.

## 2017-08-09 ENCOUNTER — Inpatient Hospital Stay (HOSPITAL_COMMUNITY)
Admission: EM | Admit: 2017-08-09 | Discharge: 2017-08-16 | DRG: 092 | Disposition: A | Discharge status: Skilled Nursing Facility | Payer: Medicare Other | Attending: Family Medicine | Admitting: Family Medicine

## 2017-08-09 ENCOUNTER — Emergency Department (HOSPITAL_COMMUNITY): Payer: Medicare Other

## 2017-08-09 ENCOUNTER — Other Ambulatory Visit: Payer: Self-pay

## 2017-08-09 ENCOUNTER — Encounter (HOSPITAL_COMMUNITY): Payer: Self-pay

## 2017-08-09 DIAGNOSIS — R404 Transient alteration of awareness: Secondary | ICD-10-CM | POA: Diagnosis not present

## 2017-08-09 DIAGNOSIS — Z818 Family history of other mental and behavioral disorders: Secondary | ICD-10-CM

## 2017-08-09 DIAGNOSIS — Z8249 Family history of ischemic heart disease and other diseases of the circulatory system: Secondary | ICD-10-CM

## 2017-08-09 DIAGNOSIS — R2681 Unsteadiness on feet: Secondary | ICD-10-CM | POA: Diagnosis not present

## 2017-08-09 DIAGNOSIS — I5032 Chronic diastolic (congestive) heart failure: Secondary | ICD-10-CM

## 2017-08-09 DIAGNOSIS — Z7982 Long term (current) use of aspirin: Secondary | ICD-10-CM | POA: Diagnosis not present

## 2017-08-09 DIAGNOSIS — G9340 Encephalopathy, unspecified: Secondary | ICD-10-CM | POA: Diagnosis not present

## 2017-08-09 DIAGNOSIS — I82611 Acute embolism and thrombosis of superficial veins of right upper extremity: Secondary | ICD-10-CM | POA: Diagnosis not present

## 2017-08-09 DIAGNOSIS — R402412 Glasgow coma scale score 13-15, at arrival to emergency department: Secondary | ICD-10-CM | POA: Diagnosis present

## 2017-08-09 DIAGNOSIS — Z87891 Personal history of nicotine dependence: Secondary | ICD-10-CM | POA: Diagnosis not present

## 2017-08-09 DIAGNOSIS — F0390 Unspecified dementia without behavioral disturbance: Secondary | ICD-10-CM | POA: Diagnosis present

## 2017-08-09 DIAGNOSIS — R339 Retention of urine, unspecified: Secondary | ICD-10-CM | POA: Diagnosis not present

## 2017-08-09 DIAGNOSIS — Z841 Family history of disorders of kidney and ureter: Secondary | ICD-10-CM | POA: Diagnosis not present

## 2017-08-09 DIAGNOSIS — M109 Gout, unspecified: Secondary | ICD-10-CM | POA: Diagnosis present

## 2017-08-09 DIAGNOSIS — G9341 Metabolic encephalopathy: Secondary | ICD-10-CM | POA: Diagnosis present

## 2017-08-09 DIAGNOSIS — N401 Enlarged prostate with lower urinary tract symptoms: Secondary | ICD-10-CM | POA: Diagnosis present

## 2017-08-09 DIAGNOSIS — T50995D Adverse effect of other drugs, medicaments and biological substances, subsequent encounter: Secondary | ICD-10-CM | POA: Diagnosis not present

## 2017-08-09 DIAGNOSIS — Z833 Family history of diabetes mellitus: Secondary | ICD-10-CM | POA: Diagnosis not present

## 2017-08-09 DIAGNOSIS — R488 Other symbolic dysfunctions: Secondary | ICD-10-CM | POA: Diagnosis not present

## 2017-08-09 DIAGNOSIS — Z803 Family history of malignant neoplasm of breast: Secondary | ICD-10-CM

## 2017-08-09 DIAGNOSIS — T50995A Adverse effect of other drugs, medicaments and biological substances, initial encounter: Secondary | ICD-10-CM | POA: Diagnosis present

## 2017-08-09 DIAGNOSIS — L03114 Cellulitis of left upper limb: Secondary | ICD-10-CM | POA: Diagnosis not present

## 2017-08-09 DIAGNOSIS — M6281 Muscle weakness (generalized): Secondary | ICD-10-CM | POA: Diagnosis not present

## 2017-08-09 DIAGNOSIS — I471 Supraventricular tachycardia: Secondary | ICD-10-CM | POA: Diagnosis present

## 2017-08-09 DIAGNOSIS — I110 Hypertensive heart disease with heart failure: Secondary | ICD-10-CM | POA: Diagnosis present

## 2017-08-09 DIAGNOSIS — R05 Cough: Secondary | ICD-10-CM | POA: Diagnosis not present

## 2017-08-09 DIAGNOSIS — G6181 Chronic inflammatory demyelinating polyneuritis: Secondary | ICD-10-CM | POA: Diagnosis not present

## 2017-08-09 DIAGNOSIS — R509 Fever, unspecified: Secondary | ICD-10-CM

## 2017-08-09 DIAGNOSIS — I10 Essential (primary) hypertension: Secondary | ICD-10-CM | POA: Diagnosis present

## 2017-08-09 DIAGNOSIS — G92 Toxic encephalopathy: Principal | ICD-10-CM | POA: Diagnosis present

## 2017-08-09 DIAGNOSIS — R338 Other retention of urine: Secondary | ICD-10-CM | POA: Diagnosis not present

## 2017-08-09 DIAGNOSIS — Z820 Family history of epilepsy and other diseases of the nervous system: Secondary | ICD-10-CM

## 2017-08-09 DIAGNOSIS — N320 Bladder-neck obstruction: Secondary | ICD-10-CM | POA: Diagnosis not present

## 2017-08-09 DIAGNOSIS — R410 Disorientation, unspecified: Secondary | ICD-10-CM | POA: Diagnosis not present

## 2017-08-09 DIAGNOSIS — Z466 Encounter for fitting and adjustment of urinary device: Secondary | ICD-10-CM | POA: Diagnosis not present

## 2017-08-09 DIAGNOSIS — M7989 Other specified soft tissue disorders: Secondary | ICD-10-CM | POA: Diagnosis not present

## 2017-08-09 DIAGNOSIS — R4182 Altered mental status, unspecified: Secondary | ICD-10-CM

## 2017-08-09 DIAGNOSIS — N179 Acute kidney failure, unspecified: Secondary | ICD-10-CM | POA: Diagnosis not present

## 2017-08-09 DIAGNOSIS — J45909 Unspecified asthma, uncomplicated: Secondary | ICD-10-CM | POA: Diagnosis not present

## 2017-08-09 DIAGNOSIS — Z950 Presence of cardiac pacemaker: Secondary | ICD-10-CM | POA: Diagnosis not present

## 2017-08-09 LAB — CBC WITH DIFFERENTIAL/PLATELET
Basophils Absolute: 0 10*3/uL (ref 0.0–0.1)
Basophils Relative: 0 %
Eosinophils Absolute: 0 10*3/uL (ref 0.0–0.7)
Eosinophils Relative: 0 %
HEMATOCRIT: 34.8 % — AB (ref 39.0–52.0)
HEMOGLOBIN: 11.3 g/dL — AB (ref 13.0–17.0)
LYMPHS PCT: 21 %
Lymphs Abs: 1.6 10*3/uL (ref 0.7–4.0)
MCH: 27 pg (ref 26.0–34.0)
MCHC: 32.5 g/dL (ref 30.0–36.0)
MCV: 83.3 fL (ref 78.0–100.0)
MONO ABS: 1 10*3/uL (ref 0.1–1.0)
Monocytes Relative: 13 %
NEUTROS ABS: 5.2 10*3/uL (ref 1.7–7.7)
NEUTROS PCT: 66 %
Platelets: 254 10*3/uL (ref 150–400)
RBC: 4.18 MIL/uL — AB (ref 4.22–5.81)
RDW: 15.2 % (ref 11.5–15.5)
WBC: 7.9 10*3/uL (ref 4.0–10.5)

## 2017-08-09 LAB — COMPREHENSIVE METABOLIC PANEL
ALK PHOS: 63 U/L (ref 38–126)
ALT: 28 U/L (ref 17–63)
AST: 55 U/L — AB (ref 15–41)
Albumin: 3.1 g/dL — ABNORMAL LOW (ref 3.5–5.0)
Anion gap: 12 (ref 5–15)
BILIRUBIN TOTAL: 1.2 mg/dL (ref 0.3–1.2)
BUN: 26 mg/dL — AB (ref 6–20)
CO2: 22 mmol/L (ref 22–32)
CREATININE: 1.07 mg/dL (ref 0.61–1.24)
Calcium: 8.9 mg/dL (ref 8.9–10.3)
Chloride: 107 mmol/L (ref 101–111)
GFR calc Af Amer: >60 mL/min (ref >60)
Glucose, Bld: 82 mg/dL (ref 65–99)
Potassium: 4.7 mmol/L (ref 3.5–5.1)
Sodium: 141 mmol/L (ref 135–145)
TOTAL PROTEIN: 7.2 g/dL (ref 6.5–8.1)

## 2017-08-09 LAB — INFLUENZA PANEL BY PCR (TYPE A & B)
Influenza A By PCR: NEGATIVE
Influenza B By PCR: NEGATIVE

## 2017-08-09 LAB — CK: Total CK: 644 U/L — ABNORMAL HIGH (ref 49–397)

## 2017-08-09 LAB — RAPID STREP SCREEN (MED CTR MEBANE ONLY): Streptococcus, Group A Screen (Direct): NEGATIVE

## 2017-08-09 LAB — I-STAT CG4 LACTIC ACID, ED: Lactic Acid, Venous: 1.43 mmol/L (ref 0.5–1.9)

## 2017-08-09 LAB — MRSA PCR SCREENING: MRSA by PCR: NEGATIVE

## 2017-08-09 LAB — LIPASE, BLOOD: Lipase: 24 U/L (ref 11–51)

## 2017-08-09 MED ORDER — SODIUM CHLORIDE 0.9 % IV BOLUS
500.0000 mL | Freq: Once | INTRAVENOUS | Status: AC
  Administered 2017-08-09: 500 mL via INTRAVENOUS

## 2017-08-09 MED ORDER — HYDRALAZINE HCL 20 MG/ML IJ SOLN
10.0000 mg | INTRAMUSCULAR | Status: DC | PRN
  Administered 2017-08-10 – 2017-08-14 (×4): 10 mg via INTRAVENOUS
  Filled 2017-08-09 (×5): qty 1

## 2017-08-09 MED ORDER — LORAZEPAM 2 MG/ML IJ SOLN
2.0000 mg | Freq: Four times a day (QID) | INTRAMUSCULAR | Status: DC
  Administered 2017-08-10 (×4): 2 mg via INTRAVENOUS
  Filled 2017-08-09 (×4): qty 1

## 2017-08-09 MED ORDER — ACETAMINOPHEN 650 MG RE SUPP
650.0000 mg | Freq: Once | RECTAL | Status: AC
  Administered 2017-08-09: 650 mg via RECTAL
  Filled 2017-08-09: qty 1

## 2017-08-09 MED ORDER — ACETAMINOPHEN 325 MG PO TABS
650.0000 mg | ORAL_TABLET | Freq: Four times a day (QID) | ORAL | Status: DC | PRN
  Administered 2017-08-11 – 2017-08-15 (×4): 650 mg via ORAL
  Filled 2017-08-09 (×4): qty 2

## 2017-08-09 MED ORDER — ACETAMINOPHEN 650 MG RE SUPP: 650.0000 mg | Freq: Four times a day (QID) | RECTAL | Status: DC | PRN

## 2017-08-09 MED ORDER — HEPARIN SODIUM (PORCINE) 5000 UNIT/ML IJ SOLN
5000.0000 [IU] | Freq: Three times a day (TID) | INTRAMUSCULAR | Status: DC
  Administered 2017-08-09 – 2017-08-16 (×21): 5000 [IU] via SUBCUTANEOUS
  Filled 2017-08-09 (×20): qty 1

## 2017-08-09 MED ORDER — VANCOMYCIN HCL IN DEXTROSE 1-5 GM/200ML-% IV SOLN
1000.0000 mg | Freq: Once | INTRAVENOUS | Status: AC
  Administered 2017-08-09: 1000 mg via INTRAVENOUS
  Filled 2017-08-09: qty 200

## 2017-08-09 MED ORDER — PIPERACILLIN-TAZOBACTAM 3.375 G IVPB 30 MIN
3.3750 g | Freq: Once | INTRAVENOUS | Status: AC
  Administered 2017-08-09: 3.375 g via INTRAVENOUS
  Filled 2017-08-09: qty 50

## 2017-08-09 MED ORDER — ACETAMINOPHEN 325 MG PO TABS
325.0000 mg | ORAL_TABLET | Freq: Once | ORAL | Status: DC
  Filled 2017-08-09: qty 1

## 2017-08-09 MED ORDER — ENALAPRILAT 1.25 MG/ML IV SOLN
0.6250 mg | Freq: Four times a day (QID) | INTRAVENOUS | Status: DC
  Administered 2017-08-09 – 2017-08-10 (×2): 0.625 mg via INTRAVENOUS
  Filled 2017-08-09 (×3): qty 0.5

## 2017-08-09 MED ORDER — ACETAMINOPHEN 500 MG PO TABS: 1000.0000 mg | ORAL_TABLET | Freq: Once | ORAL | Status: DC

## 2017-08-09 MED ORDER — LABETALOL HCL 5 MG/ML IV SOLN
5.0000 mg | INTRAVENOUS | Status: DC | PRN
  Administered 2017-08-10 (×3): 5 mg via INTRAVENOUS
  Filled 2017-08-09 (×4): qty 4

## 2017-08-09 MED ORDER — SODIUM CHLORIDE 0.9 % IV SOLN
INTRAVENOUS | Status: AC
  Administered 2017-08-09: 21:00:00 via INTRAVENOUS

## 2017-08-09 MED ORDER — ACETAMINOPHEN 325 MG RE SUPP
325.0000 mg | Freq: Once | RECTAL | Status: AC
  Administered 2017-08-09: 325 mg via RECTAL
  Filled 2017-08-09: qty 1

## 2017-08-09 MED ORDER — LABETALOL HCL 5 MG/ML IV SOLN
5.0000 mg | INTRAVENOUS | Status: DC | PRN
  Administered 2017-08-09: 5 mg via INTRAVENOUS
  Filled 2017-08-09: qty 4

## 2017-08-09 MED ORDER — METOPROLOL TARTRATE 5 MG/5ML IV SOLN
5.0000 mg | Freq: Four times a day (QID) | INTRAVENOUS | Status: DC
  Administered 2017-08-09 – 2017-08-11 (×7): 5 mg via INTRAVENOUS
  Filled 2017-08-09 (×7): qty 5

## 2017-08-09 MED ORDER — LORAZEPAM 2 MG/ML IJ SOLN
2.0000 mg | Freq: Once | INTRAMUSCULAR | Status: AC
  Administered 2017-08-09: 2 mg via INTRAVENOUS
  Filled 2017-08-09: qty 1

## 2017-08-09 MED ORDER — HYDRALAZINE HCL 20 MG/ML IJ SOLN
5.0000 mg | INTRAMUSCULAR | Status: DC | PRN
  Administered 2017-08-09: 5 mg via INTRAVENOUS
  Filled 2017-08-09: qty 1

## 2017-08-09 MED ORDER — LABETALOL HCL 5 MG/ML IV SOLN
10.0000 mg | Freq: Once | INTRAVENOUS | Status: AC
  Administered 2017-08-09: 10 mg via INTRAVENOUS

## 2017-08-09 NOTE — H&P (Addendum)
History and Physical    Jonathon [name] ZYS:[id number] DOB: 1936/02/28 DOA: 08/09/2017  PCP: Marletta Lor, MD  Patient coming from: [facility]  Chief Complaint: Lethargy, AMS  HPI: RAHMON HEIGL is a 82 y.o. male with medical history significant of asthma, chronic diastolic CHF, chronic inflammatory demyelinating polyneuropathy, HTN initially presented to ED on 3/29 with concerns of new mental status changes. Patient is currently encephalopathic and unable to provide accurate history. Family not available. Chart reviewed. On the morning of 3/29, patient noted to have acute mental status change associated with refusing PO intake and PO meds. Patient was recently started on zyprexa one week prior to onset of symptoms reportedly for hx of paranoia. In the ED on 3/29, work up was found to be unremarkable, including head CT and CXR. Patient was given benadryl with olanzapine discontinued. Family declined psychiatric evaluation and patient returned to facility. On the morning of 3/30, patient was note to have fever of 100.11F axillary with increased lethargy. No cough, diarrhea. Pacemaker was interrogated and found to be unremarkable. Pt had been on IVIG for chronic inflammatory demyelinating polyneuropathy  ED Course: In the ED, patient found to be hypertensive with temp of 100.45F rectally and with RR in the mid 20's. No leukocytosis. BMET found to be unremarkable. CXR was clear. CT head was unremarkable. Recent UA unremarkable. Rapid strep neg. Flu neg. Viral respiratory panel obtained and is currently pending. Patient was given one dose of vancomycin and zosyn. ED discussed case with on-call Neurology who recommended 2mg  ativan q6hrs with IVF hydration and admission to SDU. Hospitalist consulted for consideration for admission.  Review of Systems:  Review of Systems  Unable to perform ROS: Mental status change    Past Medical History:  Diagnosis Date  . Asthma   . BENIGN PROSTATIC  HYPERTROPHY, HX OF 10/25/2008  . BRADYCARDIA 2005  . Chronic diastolic congestive heart failure ([place]) 06/05/2016  . CIDP (chronic inflammatory demyelinating polyneuropathy) ([place])   . HYPERTENSION 11/21/2006  . Iron deficiency anemia, unspecified 04/19/2013  . 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 PACEMAKER, PERMANENT 2005   Gen change 2014 Medtronic Adaptic L dual-chamber pacemaker, serial [id number]   . SVT (supraventricular tachycardia) ([place])   . TOBACCO ABUSE 10/25/2008   Quit 2012    Past Surgical History:  Procedure Laterality Date  . COLONOSCOPY  [id number]  . PACEMAKER INSERTION  2005  . PERMANENT PACEMAKER GENERATOR CHANGE N/A 06/19/2012   Procedure: PERMANENT PACEMAKER GENERATOR CHANGE;  Surgeon: [name], MD; Medtronic Adaptic L dual-chamber pacemaker, serial #[id number]    . SKIN GRAFT Right 1962   wrist     reports that he quit smoking about 7 years ago. His smoking use included cigarettes. He started smoking about 52 years ago. He has a 11.50 pack-year smoking history. He has never used smokeless tobacco. He reports that he does not drink alcohol or use drugs.  No Known Allergies  Family History  Problem Relation Age of Onset  . Heart attack Father        Died, 35  . Kidney disease Father   . Parkinson's disease Mother        Died, 75  . Breast cancer Sister        Living, 14  . Diabetes type II Brother        Living, 38  . Breast cancer Sister        Living, 43  . Diabetes Daughter  Living, 51  . Diabetes Son        Living, 89  . Ovarian cancer Daughter   . Colon cancer Neg Hx   . Rectal cancer Neg Hx   . Stomach cancer Neg Hx     Prior to Admission medications   Medication Sig Start Date End Date Taking? Authorizing Provider  colchicine 0.6 MG tablet Day 1:Take 2 tablets (1.2mg ) by mouth.  Then take 1 tablet 1 hour later.  Day 2: take 1 tablet. Patient taking differently: Take 0.6 mg by mouth daily as needed (gout  pain).  01/28/17   Marletta Lor, MD  escitalopram (LEXAPRO) 5 MG tablet Take 1 tablet (5 mg total) by mouth daily. 07/31/17   Marletta Lor, MD  feeding supplement, ENSURE ENLIVE, (ENSURE ENLIVE) LIQD Take 237 mLs by mouth 2 (two) times daily between meals. 06/07/16   Florencia Reasons, MD  flecainide [facility]) 150 MG tablet TAKE 1/2 TABLET BY MOUTH TWICE DAILY 07/23/17   [name], MD  furosemide (LASIX) 20 MG tablet Take 1 tablet (20 mg total) by mouth daily. 04/30/17   Marletta Lor, MD  Garlic 7425 MG CAPS Take 2,000 mg by mouth daily.     [provider]  irbesartan (AVAPRO) 300 MG tablet TAKE 1 TABLET(300 MG) BY MOUTH DAILY 03/18/17   [name], MD  metoprolol succinate (TOPROL-XL) 25 MG 24 hr tablet TAKE 1 TABLET(25 MG) BY MOUTH DAILY 03/03/17   [name], MD  OLANZapine (ZYPREXA) 5 MG tablet Take 1 tablet (5 mg total) by mouth at bedtime. 08/05/17   Purohit, Konrad Dolores, MD  tamsulosin (FLOMAX) 0.4 MG CAPS capsule TAKE 1 CAPSULE BY MOUTH EVERY DAY 06/11/17   Marletta Lor, MD  traZODone (DESYREL) 50 MG tablet Take 1 tablet (50 mg total) by mouth at bedtime as needed for sleep. 08/04/17   Purohit, Konrad Dolores, MD  vitamin B-12 (CYANOCOBALAMIN) 1000 MCG tablet Take 1,000 mcg by mouth daily.    [provider]  Vitamin D, Ergocalciferol, (DRISDOL) 50000 units CAPS capsule Take 1 capsule (50,000 Units total) by mouth every 7 (seven) days. 09/21/15   Lyndal Pulley, DO    Physical Exam: Vitals:   08/09/17 1630 08/09/17 1730 08/09/17 1800 08/09/17 1837  BP: (!) 156/85 (!) 165/90 (!) 152/99 (!) 184/92  Pulse: 100 95 (!) 103 (!) 104  Resp: (!) 27  (!) 23 (!) 22  Temp:      TempSrc:      SpO2: 96% 94% 96% 96%  Weight:        Constitutional: NAD, calm, comfortable Vitals:   08/09/17 1630 08/09/17 1730 08/09/17 1800 08/09/17 1837  BP: (!) 156/85 (!) 165/90 (!) 152/99 (!) 184/92  Pulse: 100 95 (!) 103 (!) 104  Resp: (!) 27  (!) 23 (!) 22  Temp:       TempSrc:      SpO2: 96% 94% 96% 96%  Weight:       Eyes: PERRL, lids and conjunctivae normal ENMT: Mucous membranes are moist. Posterior pharynx clear of any exudate or lesions.Normal dentition.  Neck: normal, supple, no masses, no thyromegaly Respiratory: clear to auscultation bilaterally, no wheezing, no crackles. Normal respiratory effort. No accessory muscle use.  Cardiovascular: Regular rate and rhythm, s1, s2 Abdomen: no tenderness, no masses palpated. No hepatosplenomegaly. Bowel sounds positive.  Musculoskeletal: no clubbing / cyanosis. No joint deformity upper and lower extremities. Good ROM, no contractures. Normal muscle tone.  Skin:  no rashes, lesions, ulcers. No induration Neurologic: difficult to assess given mental status at present Psychiatric: unable to assess given mental status change   Labs on Admission: I have personally reviewed following labs and imaging studies  CBC: Recent Labs  Lab 08/03/17 0436 08/05/17 0542 08/08/17 1917 08/09/17 1234  WBC 6.0 4.6 7.2 7.9  NEUTROABS  --   --   --  5.2  HGB 11.5* 11.0* 12.6* 11.3*  HCT 34.2* 32.9* 36.9* 34.8*  MCV 81.2 82.5 82.7 83.3  PLT 178 180 229 703   Basic Metabolic Panel: Recent Labs  Lab 08/03/17 0436 08/05/17 0542 08/08/17 1917 08/09/17 1234  NA 136 140 138 141  K 3.9 3.7 4.8 4.7  CL 102 105 102 107  CO2 25 25 22 22   GLUCOSE 106* 98 96 82  BUN 16 20 25* 26*  CREATININE 0.95 1.27* 1.17 1.07  CALCIUM 9.0 8.6* 9.3 8.9  MG 1.7 1.8  --   --    GFR: Estimated Creatinine Clearance: 59.6 mL/min (by C-G formula based on SCr of 1.07 mg/dL). Liver Function Tests: Recent Labs  Lab 08/08/17 1917 08/09/17 1234  AST 90* 55*  ALT 36 28  ALKPHOS 76 63  BILITOT 1.3* 1.2  PROT 8.1 7.2  ALBUMIN 3.3* 3.1*   Recent Labs  Lab 08/09/17 1234  LIPASE 24   Recent Labs  Lab 08/08/17 2023  AMMONIA 14   Coagulation Profile: No results for input(s): INR, PROTIME in the last 168 hours. Cardiac  Enzymes: Recent Labs  Lab 08/09/17 1234  CKTOTAL 644*   BNP (last 3 results) Recent Labs    01/06/17 1705  PROBNP 532.0*   HbA1C: No results for input(s): HGBA1C in the last 72 hours. CBG: Recent Labs  Lab 08/04/17 1136 08/04/17 1613 08/05/17 0721 08/05/17 1137 08/08/17 1858  GLUCAP 101* 104* 131* 114* 66   Lipid Profile: No results for input(s): CHOL, HDL, LDLCALC, TRIG, CHOLHDL, LDLDIRECT in the last 72 hours. Thyroid Function Tests: No results for input(s): TSH, T4TOTAL, FREET4, T3FREE, THYROIDAB in the last 72 hours. Anemia Panel: No results for input(s): VITAMINB12, FOLATE, FERRITIN, TIBC, IRON, RETICCTPCT in the last 72 hours. Urine analysis:    Component Value Date/Time   COLORURINE YELLOW 08/08/2017 2120   APPEARANCEUR CLEAR 08/08/2017 2120   LABSPEC 1.019 08/08/2017 2120   PHURINE 5.0 08/08/2017 2120   GLUCOSEU NEGATIVE 08/08/2017 2120   HGBUR SMALL (A) 08/08/2017 2120   BILIRUBINUR NEGATIVE 08/08/2017 2120   KETONESUR 5 (A) 08/08/2017 2120   PROTEINUR 30 (A) 08/08/2017 2120   UROBILINOGEN 1.0 08/10/2014 1653   NITRITE NEGATIVE 08/08/2017 2120   LEUKOCYTESUR NEGATIVE 08/08/2017 2120   Sepsis Labs: !!!!!!!!!!!!!!!!!!!!!!!!!!!!!!!!!!!!!!!!!!!! @LABRCNTIP (procalcitonin:4,lacticidven:4) ) Recent Results (from the past 240 hour(s))  Rapid strep screen     Status: None   Collection Time: 08/09/17  5:39 PM  Result Value Ref Range Status   Streptococcus, Group A Screen (Direct) NEGATIVE NEGATIVE Final    Comment: (NOTE) A Rapid Antigen test may result negative if the antigen level in the sample is below the detection level of this test. The FDA has not cleared this test as a stand-alone test therefore the rapid antigen negative result has reflexed to a Group A Strep culture. Performed at [facility], [place] [address] [place], [place] [address]      Radiological Exams on Admission: Dg Chest 1 View  Result Date: 08/08/2017 CLINICAL  DATA:  Altered mental status EXAM: CHEST  1 VIEW COMPARISON:  08/01/2017 FINDINGS: Left-sided pacing device as before. No consolidation or effusion. Stable cardiomediastinal silhouette. No pneumothorax. IMPRESSION: No active disease. Electronically Signed   By: Donavan Foil M.D.   On: 08/08/2017 20:10   Dg Chest 2 View  Result Date: 08/09/2017 CLINICAL DATA:  Fever. EXAM: CHEST - 2 VIEW COMPARISON:  08/08/2017 FINDINGS: Left chest wall pacer device is noted with lead in the right atrial appendage and right ventricle. The heart size appears normal. There is no pleural effusion or edema. No airspace opacities. IMPRESSION: 1. No acute cardiopulmonary abnormalities. Electronically Signed   By: Kerby Moors M.D.   On: 08/09/2017 14:48   Ct Head Wo Contrast  Result Date: 08/08/2017 CLINICAL DATA:  Altered mental status.  Found down. EXAM: CT HEAD WITHOUT CONTRAST TECHNIQUE: Contiguous axial images were obtained from the base of the skull through the vertex without intravenous contrast. COMPARISON:  08/01/2017 FINDINGS: Brain: No mass lesion, intraparenchymal hemorrhage or extra-axial collection. No evidence of acute cortical infarct. Normal appearance of the brain parenchyma and extra axial spaces for age. Vascular: No hyperdense vessel or unexpected vascular calcification. Skull: Normal visualized skull base, calvarium and extracranial soft tissues. Sinuses/Orbits: No sinus fluid levels or advanced mucosal thickening. No mastoid effusion. Chronic fracture of the right lamina papyracea. IMPRESSION: Normal aging brain. Electronically Signed   By: Ulyses Jarred M.D.   On: 08/08/2017 20:08    Assessment/Plan Principal Problem:   Metabolic encephalopathy Active Problems:   Essential hypertension   PACEMAKER, PERMANENT   CIDP (chronic inflammatory demyelinating polyneuropathy) ([facility])   Chronic diastolic congestive heart failure ([facility])   Acute encephalopathy   Dementia   1. Toxic-metabolic  encephalopathy 1. Uncertain etiology 2. No infectious process identified at present 3. Consideration for viral process vs possible serotonin syndrome 4. Neurology called by ED and will see in consultation 5. Neurology recommendation for 2mg  ativan q6hrs, IVF hydration and admission to SDU  2. Fever 1. Continue antipyretics as tolerated 2. Concerns for serotonin syndrome vs viral process per above 3. UA unremarkable. CXR clear.  4. Will check blood cultures 5. Will hold off on further abx at this time 3. HTN 1. Patient had been refusing medications prior to admit 2. BP currently suboptimally controlled 3. Wil continue patient on scheduled IV ACEI and IV beta blocker with anticipation to transition to home PO meds when mental status improves 4. CIDP 1. Patient had been receiving IVIG prior to admit, last dose on 3/22 2. Will continue to monitor for now 3. Neurology consulted per above 5. Chronic diastolic CHF 1. Clinically euvolemic 2. Will give hydration overnight per neurology recommendations 3. Monitor I/o and daily wts 4. Most recent 2d echo from 3/23 with EF normal LVEF and grade 1 diastolic dysfunction 6. Hx dementia 1. Currently encephalopathic per above 2. Will continue per above  DVT prophylaxis: heparin subq  Code Status: Full Family Communication: Pt in room, family not at bedside  Disposition Plan: Uncertain at this time  Consults called: Neurology per ED, Dr. Lorraine Lax Admission status: Inpt as would likely require greater than 2 midnight stay to resolve encephalopathy   Marylu Lund MD [facility] Pager [phone]  If 7PM-7AM, please contact night-coverage [email or web address] Password [facility]  08/09/2017, 6:40 PM

## 2017-08-09 NOTE — ED Provider Notes (Signed)
[facility] DEPT Provider Note   CSN: [id number] Arrival date & time: 08/09/17  1129     History   Chief Complaint Chief Complaint  Patient presents with  . Fever    HPI Jonathon ALPER is a 82 y.o. 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   Staff notes a 100.1 F axillary temperature. Spoke with Lelan Pons, NT, at [facility] who stated patient was very talkative and social, eating and drinking when he arrived several days ago. States he has been more lethargic, which she describes as less responsive, since yesterday morning. Today, he was saying 1-2 words, then would close his eyes and wouldn't answer. Mucous was noted in his mouth that was swabbed out. They spoke with Dr. Raliegh Ip, on-call provider, who advised return to the ED for further evaluation. Staff denies recent vomiting, diarrhea, cough, or shortness of breath. Family also notes that the patient has refused to put anything in his mouth, including medication or food.   He was seen in the ED on 08/08/16 for AMS since yesterday morning. Family told ED staff yesterday the patient has been speaking nonsensical sentences and has been paranoid since September 2018.  Work up yesterday including, CBC, CMP, UA w/ culture, ethanol, ammonia, troponin, EKG, 1-view portable CXR, and head CT was negative.  PA-C spoke with hospitalist who did not feel inpatient admission was warranted at this time.  Family declined TTS consult at that time.  The discussion with the patient's family regarding waxing and waning altered mental status since September.  Family states they have noticed no changes in patient's mentation other than he has been more agitated over the last few days.  Patient was admitted from 3/22-3/26 with  intermittent altered mental status that had apparently been going on for several months.  Patient had been living with daughter, but she is no longer able to take care of him.  Patient had an unresponsive episode while in the emergency department.  He was started on olanzapine, which was later discontinued.  EP interrogation of pacemaker with no evidence of arrhythmia.  EEG was normal and unchanged from previous.  Echo with grade 1 diastolic dysfunction.  MRI deferred due to pacemaker not being MRI compatible.  He has been on IVIG as an outpatient for chronic inflammatory demyelinating polyneuropathy. He was given a dose on 3/22.  He was discharged from the hospital to [facility].  Patient walks with a walker, but has not gotten out of bed much since yesterday.    Level 5 caveat secondary to altered mental status.  The history is provided by the patient. No language interpreter was used.  Fever   Associated symptoms include sore throat and cough. Pertinent negatives include no diarrhea and no vomiting.    Past Medical History:  Diagnosis Date  . Asthma   . BENIGN PROSTATIC HYPERTROPHY, HX OF 10/25/2008  . BRADYCARDIA 2005  . Chronic diastolic congestive heart failure ([place]) 06/05/2016  . CIDP (chronic inflammatory demyelinating polyneuropathy) ([place])   . HYPERTENSION 11/21/2006  . Iron deficiency anemia, unspecified 04/19/2013  . 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 PACEMAKER, PERMANENT 2005   Gen change 2014 Medtronic Adaptic L dual-chamber pacemaker, serial [id number]   . SVT (supraventricular tachycardia) ([place])   . TOBACCO ABUSE 10/25/2008  Quit 2012    Patient Active Problem List   Diagnosis Date Noted  . Dementia 08/04/2017  . Seizure ([place]) 08/01/2017  . Thiamine deficiency 05/27/2017  . B12 deficiency 04/30/2017  . Alcoholism ([place]) 01/28/2017  . Acute encephalopathy 12/28/2016  . Hepatic steatosis 06/05/2016  . Physical deconditioning  06/05/2016  . Chronic diastolic congestive heart failure ([place]) 06/05/2016  . Orthostatic syncope   . Elevated liver function tests   . LOC (loss of consciousness) ([place]) 08/10/2014  . Syncope 08/10/2014  . CIDP (chronic inflammatory demyelinating polyneuropathy) ([place]) 08/08/2014  . Sustained SVT ([place]) 07/27/2014  . 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 Abnormal LFTs 08/06/2013  . Iron deficiency anemia 04/19/2013  . Muscle weakness 01/13/2013  . External hemorrhoids 03/23/2011  . Symptomatic bradycardia 10/25/2008  . 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 BENIGN PROSTATIC HYPERTROPHY, HX OF 10/25/2008  . PACEMAKER, PERMANENT 10/25/2008  . Essential hypertension 11/21/2006  . BPH (benign prostatic hyperplasia) 11/21/2006  . History of colonic polyps 11/21/2006  . NEPHROLITHIASIS, HX OF 11/21/2006    Past Surgical History:  Procedure Laterality Date  . COLONOSCOPY  [id number]  . PACEMAKER INSERTION  2005  . PERMANENT PACEMAKER GENERATOR CHANGE N/A 06/19/2012   Procedure: PERMANENT PACEMAKER GENERATOR CHANGE;  Surgeon: [name], MD; Medtronic Adaptic L dual-chamber pacemaker, serial #[id number]    . SKIN GRAFT Right 1962   wrist        Home Medications    Prior to Admission medications   Medication Sig Start Date End Date Taking? Authorizing Provider  colchicine 0.6 MG tablet Day 1:Take 2 tablets (1.2mg ) by mouth.  Then take 1 tablet 1 hour later.  Day 2: take 1 tablet. Patient taking differently: Take 0.6 mg by mouth daily as needed (gout pain).  01/28/17   Marletta Lor, MD  escitalopram (LEXAPRO) 5 MG tablet Take 1 tablet (5 mg total) by mouth daily. 07/31/17   Marletta Lor, MD  feeding supplement, ENSURE ENLIVE, (ENSURE ENLIVE) LIQD Take 237 mLs by mouth 2 (two) times daily between meals. 06/07/16   Florencia Reasons, MD  flecainide [facility]) 150 MG tablet TAKE 1/2 TABLET BY MOUTH TWICE DAILY 07/23/17   [name], MD  furosemide (LASIX) 20 MG tablet Take 1 tablet  (20 mg total) by mouth daily. 04/30/17   Marletta Lor, MD  Garlic 6440 MG CAPS Take 2,000 mg by mouth daily.     [provider]  irbesartan (AVAPRO) 300 MG tablet TAKE 1 TABLET(300 MG) BY MOUTH DAILY 03/18/17   [name], MD  metoprolol succinate (TOPROL-XL) 25 MG 24 hr tablet TAKE 1 TABLET(25 MG) BY MOUTH DAILY 03/03/17   [name], MD  OLANZapine (ZYPREXA) 5 MG tablet Take 1 tablet (5 mg total) by mouth at bedtime. 08/05/17   Purohit, Konrad Dolores, MD  tamsulosin (FLOMAX) 0.4 MG CAPS capsule TAKE 1 CAPSULE BY MOUTH EVERY DAY 06/11/17   Marletta Lor, MD  traZODone (DESYREL) 50 MG tablet Take 1 tablet (50 mg total) by mouth at bedtime as needed for sleep. 08/04/17   Purohit, Konrad Dolores, MD  vitamin B-12 (CYANOCOBALAMIN) 1000 MCG tablet Take 1,000 mcg by mouth daily.    [provider]  Vitamin D, Ergocalciferol, (DRISDOL) 50000 units CAPS capsule Take 1 capsule (50,000 Units total) by mouth every 7 (seven) days. 09/21/15   Lyndal Pulley, DO    Family History Family History  Problem Relation Age of Onset  . Heart attack Father  Died, 65  . Kidney disease Father   . Parkinson's disease Mother        Died, 62  . Breast cancer Sister        Living, 84  . Diabetes type II Brother        Living, 33  . Breast cancer Sister        Living, 50  . Diabetes Daughter        Living, 68  . Diabetes Son        Living, 60  . Ovarian cancer Daughter   . Colon cancer Neg Hx   . Rectal cancer Neg Hx   . Stomach cancer Neg Hx     Social History Social History   Tobacco Use  . Smoking status: Former Smoker    Packs/day: 0.25    Years: 46.00    Pack years: 11.50    Types: Cigarettes    Start date: 03/16/1965    Last attempt to quit: 06/15/2010    Years since quitting: 7.1  . Smokeless tobacco: Never Used  . Tobacco comment: quit 3 years ago  Substance Use Topics  . Alcohol use: No    Alcohol/week: 0.0 oz    Comment: 4 beers per day  . Drug use: No      Allergies   Patient has no known allergies.   Review of Systems Review of Systems  Unable to perform ROS: Mental status change  Constitutional: Positive for fever.  HENT: Positive for sore throat.   Respiratory: Positive for cough. Negative for shortness of breath.   Gastrointestinal: Negative for abdominal pain, diarrhea and vomiting.  Allergic/Immunologic: Positive for immunocompromised state.  Psychiatric/Behavioral: Positive for confusion.     Physical Exam Updated Vital Signs BP (!) 165/90   Pulse 95   Temp (!) 100.4 F (38 C) (Rectal)   Resp (!) 27   Wt 79.4 kg (175 lb)   SpO2 94%   BMI 24.41 kg/m   Physical Exam  Constitutional: He appears well-developed.  HENT:  Head: Normocephalic.  Right Ear: Tympanic membrane normal.  Left Ear: Tympanic membrane normal.  Nose: Nose normal.  Mouth/Throat: Uvula is midline. Posterior oropharyngeal erythema present. No oropharyngeal exudate, posterior oropharyngeal edema or tonsillar abscesses.  Eyes: Pupils are equal, round, and reactive to light. Conjunctivae are normal.  Neck: Normal range of motion. Neck supple. No tracheal deviation present. No thyromegaly present.  Patient has full active rotation of the neck.  No meningismus.  Cardiovascular: Regular rhythm, normal heart sounds and intact distal pulses. Tachycardia present. Exam reveals no gallop and no friction rub.  No murmur heard. Pulmonary/Chest: Effort normal and breath sounds normal. No stridor. No respiratory distress. He has no wheezes. He has no rales. He exhibits no tenderness.  Abdominal: Soft. Bowel sounds are normal. He exhibits no distension and no mass. There is no tenderness. There is no rebound and no guarding. No hernia.  Abdomen is soft, nontender, nondistended.  Musculoskeletal: Normal range of motion. He exhibits no edema, tenderness or deformity.  Holding the bilateral hands in a clenched position.  Bilateral lower extremities do not appear  rigid.  Neurological: He is alert.  GCS 15. Oriented to self.  Intermittently responds appropriately to questions appropriately and intermittently follows commands, family states this is baseline.  Cogwheeling in the bilateral upper extremities.  5-6 beats of clonus in the bilateral lower extremities.    Skin: Skin is warm and dry. Capillary refill takes less than 2 seconds.  Psychiatric:  He has a normal mood and affect.  Nursing note and vitals reviewed.  ED Treatments / Results  Labs (all labs ordered are listed, but only abnormal results are displayed) Labs Reviewed  CBC WITH DIFFERENTIAL/PLATELET - Abnormal; Notable for the following components:      Result Value   RBC 4.18 (*)    Hemoglobin 11.3 (*)    HCT 34.8 (*)    All other components within normal limits  COMPREHENSIVE METABOLIC PANEL - Abnormal; Notable for the following components:   BUN 26 (*)    Albumin 3.1 (*)    AST 55 (*)    All other components within normal limits  CK - Abnormal; Notable for the following components:   Total CK 644 (*)    All other components within normal limits  RESPIRATORY PANEL BY PCR  RAPID STREP SCREEN (NOT AT [facility])  LIPASE, BLOOD  INFLUENZA PANEL BY PCR (TYPE A & B)  I-STAT CG4 LACTIC ACID, ED  I-STAT CG4 LACTIC ACID, ED    EKG None  Radiology Dg Chest 1 View  Result Date: 08/08/2017 CLINICAL DATA:  Altered mental status EXAM: CHEST  1 VIEW COMPARISON:  08/01/2017 FINDINGS: Left-sided pacing device as before. No consolidation or effusion. Stable cardiomediastinal silhouette. No pneumothorax. IMPRESSION: No active disease. Electronically Signed   By: Donavan Foil M.D.   On: 08/08/2017 20:10   Dg Chest 2 View  Result Date: 08/09/2017 CLINICAL DATA:  Fever. EXAM: CHEST - 2 VIEW COMPARISON:  08/08/2017 FINDINGS: Left chest wall pacer device is noted with lead in the right atrial appendage and right ventricle. The heart size appears normal. There is no pleural effusion or edema. No  airspace opacities. IMPRESSION: 1. No acute cardiopulmonary abnormalities. Electronically Signed   By: Kerby Moors M.D.   On: 08/09/2017 14:48   Ct Head Wo Contrast  Result Date: 08/08/2017 CLINICAL DATA:  Altered mental status.  Found down. EXAM: CT HEAD WITHOUT CONTRAST TECHNIQUE: Contiguous axial images were obtained from the base of the skull through the vertex without intravenous contrast. COMPARISON:  08/01/2017 FINDINGS: Brain: No mass lesion, intraparenchymal hemorrhage or extra-axial collection. No evidence of acute cortical infarct. Normal appearance of the brain parenchyma and extra axial spaces for age. Vascular: No hyperdense vessel or unexpected vascular calcification. Skull: Normal visualized skull base, calvarium and extracranial soft tissues. Sinuses/Orbits: No sinus fluid levels or advanced mucosal thickening. No mastoid effusion. Chronic fracture of the right lamina papyracea. IMPRESSION: Normal aging brain. Electronically Signed   By: Ulyses [name] M.D.   On: 08/08/2017 20:08    Procedures Procedures (including critical care time)  Medications Ordered in ED Medications  vancomycin (VANCOCIN) IVPB 1000 mg/200 mL premix (1,000 mg Intravenous New Bag/Given 08/09/17 1723)  sodium chloride 0.9 % bolus 500 mL (has no administration in time range)  LORazepam (ATIVAN) injection 2 mg (has no administration in time range)  acetaminophen (TYLENOL) suppository 650 mg (650 mg Rectal Given 08/09/17 1247)  piperacillin-tazobactam (ZOSYN) IVPB 3.375 g (0 g Intravenous Stopped 08/09/17 1718)  acetaminophen (TYLENOL) suppository 325 mg (325 mg Rectal Given 08/09/17 1723)     Initial Impression / Assessment and Plan / ED Course  I have reviewed the triage vital signs and the nursing notes.  Pertinent labs & imaging results that were available during my care of the patient were reviewed by me and considered in my medical decision making (see chart for details).    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  Patient with waxing and waning mentation since September 2018, but has been more agitated over the last 3 days.   All workup from yesterday's ED visit was reviewed.  Started on Lexapro on 3/21.  He was admitted to the hospital on 3/22-26 and given olanzapine.  Olanzapine has been DC'd by nursing facility.  Patient has not been taking his medications over the last few days and has only received 1 dose of Lexapro since initiated.  Workup today for source of the patient's fever has been largely unremarkable.  Influenza was negative.  Family notes the patient has been refusing to eat.  When asked, he seems to imply that it is more of his throat.  On physical exam, there is posterior oropharynx, but exam is otherwise unremarkable.  Rapid strep is negative.  Respiratory virus panel is pending.  Labs from yesterday's ED visit were repeated and grossly unchanged.  Chest x-ray is negative.  Urinalysis from yesterday does not appear infectious.  CK level 644.  On physical exam, the patient does have cogwheeling of the bilateral upper extremities and 5-6 beats of clonus in the bilateral lower extremities.  Patient is oriented only to himself.   The patient was seen and evaluated by myself and Dr. Sherry Ruffing, who was in agreement with the workup and plan.  Given recent altered mental status, with new agitation, elevated CK, and focal findings on neurologic exam with new SNRI and second generation antipsychotic on board, consulted neurology, for concern for serotonin syndrome versus neuroleptic malignant syndrome.  Spoke with Dr. Lorraine Lax who recommended 2 mg of Ativan every 6 hours, hydration with IV fluids, and admit to stepdown.  He will see the patient. Recommended holding  cyproheptadine at this time.  Doubt meningitis, ICH, or SAH. Doubt pneumonia or influenza.   In the ED, the patient's fever has been treated with Tylenol suppository.  500 cc fluid bolus given.  2 mg of Ativan given. Spoke with Dr. Wyline Copas, hospitalist, who will admit the patient. The patient appears reasonably stabilized for admission considering the current resources, flow, and capabilities available in the ED at this time, and I doubt any other [facility] requiring further screening and/or treatment in the ED prior to admission.   Final Clinical Impressions(s) / ED Diagnoses   Final diagnoses:  Fever, unspecified fever cause  Transient alteration of awareness    ED Discharge Orders    None       Joanne Gavel, PA-C 08/09/17 1802    Tegeler, Gwenyth Allegra, MD 08/11/17 [phone]

## 2017-08-09 NOTE — ED Triage Notes (Signed)
Pt arrives from [facility] facility. Pt seen at [facility] yesterday for AMS. [facility] discontinued medication Zyprexa that  They think was causing AMS. Pt arrives today via EMS for fever and lethargy.

## 2017-08-09 NOTE — Discharge Instructions (Addendum)
Discontinue his olanzapine. Follow-up with his primary care provider as soon as possible for further evaluation and any further medication adjustments.

## 2017-08-09 NOTE — Progress Notes (Signed)
A consult was received from an ED physician for vancomycin and zosyn per pharmacy dosing.  The patient's profile has been reviewed for ht/wt/allergies/indication/available labs.    A one time order has been placed for vancomycin 1000 mg IV and zosyn 3.375 gm IV over 30 min.  Further antibiotics/pharmacy consults should be ordered by admitting physician if indicated.                       Thank you, Lynelle Doctor 08/09/2017  3:01 PM

## 2017-08-09 NOTE — ED Notes (Signed)
Lab called to add on labs to existing specimens

## 2017-08-09 NOTE — Consult Note (Signed)
Neurology Consultation Reason for Consult:  Referring Physician: Joline Maxcy, [facility]  History is obtained from: Caretaker and chart review  HPI: Jonathon [name] is a 82 y.o. 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 He was discharged to NH after recent admission and has remained confused. However since yesterday, patient has been rigid and unable to get up from bed. Patient noted to be febrile, and rigid in the ER with clonus noted by EDP. CK mildly elevated. Infectious workup negative. BP elevated in 517- 200--systolics. Neurology was consulted for further recommendations.     ROS:  Unable to obtain due to altered mental status.   Past Medical History:  Diagnosis Date  . Asthma   . BENIGN PROSTATIC HYPERTROPHY, HX OF 10/25/2008  . BRADYCARDIA 2005  . Chronic diastolic congestive heart failure ([place]) 06/05/2016  . CIDP (chronic inflammatory demyelinating polyneuropathy) ([place])   . HYPERTENSION 11/21/2006  . Iron deficiency anemia, unspecified 04/19/2013  . 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 PACEMAKER, PERMANENT 2005   Gen change 2014 Medtronic Adaptic L dual-chamber pacemaker, serial [id number]   . SVT (supraventricular tachycardia) ([place])   . TOBACCO ABUSE 10/25/2008   Quit 2012     Family History  Problem Relation Age of Onset  . Heart attack Father        Died, 76  . Kidney disease Father   . Parkinson's disease Mother        Died, 30  . Breast cancer Sister        Living, 68  . Diabetes type II Brother        Living, 75  . Breast cancer Sister        Living, 34  . Diabetes Daughter        Living, 75  . Diabetes Son        Living, 46  . Ovarian cancer Daughter   . Colon cancer Neg Hx   . Rectal cancer Neg Hx   . Stomach cancer Neg Hx      Social History:  reports that  he quit smoking about 7 years ago. His smoking use included cigarettes. He started smoking about 52 years ago. He has a 11.50 pack-year smoking history. He has never used smokeless tobacco. He reports that he does not drink alcohol or use drugs.   Exam: Current vital signs: BP (!) 184/92 (BP Location: Right Arm)   Pulse (!) 104   Temp (!) 100.4 F (38 C) (Rectal)   Resp (!) 22   Wt 79.4 kg (175 lb)   SpO2 96%   BMI 24.41 kg/m  Vital signs in last 24 hours: Temp:  [98.7 F (37.1 C)-100.5 F (38.1 C)] 100.4 F (38 C) (03/30 1439) Pulse Rate:  [68-104] 104 (03/30 1837) Resp:  [16-31] 22 (03/30 1837) BP: (145-202)/(79-123) 184/92 (03/30 1837) SpO2:  [94 %-100 %] 96 % (03/30 1837) Weight:  [79.4 kg (175 lb)] 79.4 kg (175 lb) (03/30 1158)   Physical Exam  Constitutional: Appears well-developed and well-nourished.  Psych: Affect appropriate to situation Eyes: No scleral injection HENT: No OP obstrucion Head: Normocephalic.  Cardiovascular: Normal rate and regular rhythm.  Respiratory: Effort normal, non-labored breathing GI: Soft.  No distension. There is no tenderness.  Skin: WDI  Neuro: Mental Status: Patient is confused, somnolent but easily arousable, follows some commands. Speaks incoherently. Patient is  unable to give a clear and coherent history. Cranial Nerves: II: Visual Fields : unable to assess  Pupils are equal, round, and reactive to light.   III,IV, VI: unable to assess  V: Facial sensation is symmetric VII: Facial movement is symmetric.  VIII: hearing is intact to voice X: Uvula elevates symmetrically XI: Shoulder shrug is symmetric. XII: tongue is midline without atrophy or fasciculations.  Motor: Tone is increased in all 4 extremities. Bulk is normal.  Sensory: Unable to assess Deep Tendon Reflexes: Patellar, anke reflexes absent bilaterally Plantars: Toes are downgoing bilaterally.  Cerebellar: Unable to assess       I have reviewed labs in  epic and the results pertinent to this consultation are: CBC is wnl  I have reviewed the images obtained:CT head   ASSESSMENT AND PLAN    82 y.o. 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 Constellation of symptoms concerning for NMS/Serotonin syndrome. Suspect SS as fever is low grade, CK is 600. On assessment, rigidity has improved as he had just received Ativan.   Recommendations Stop Lexapro, Olanzapine, trazadone IV fluids Ativan 2mg  Q6h Trend CK, Renal function Symptoms seem improving, so role of cyproheptadine/dantrolene  at this point Goal BP,160 SBP   Karena Addison Demosthenes Virnig MD [facility] [phone]  If 7pm to 7am, please call on call as listed on AMION.

## 2017-08-09 NOTE — ED Notes (Signed)
Bed: WA01 Expected date: 08/09/17 Expected time: 11:12 AM Means of arrival: Ambulance Comments: Lethargy

## 2017-08-10 DIAGNOSIS — Z950 Presence of cardiac pacemaker: Secondary | ICD-10-CM

## 2017-08-10 LAB — CBC
HEMATOCRIT: 40.4 % (ref 39.0–52.0)
Hemoglobin: 13 g/dL (ref 13.0–17.0)
MCH: 27.1 pg (ref 26.0–34.0)
MCHC: 32.2 g/dL (ref 30.0–36.0)
MCV: 84.2 fL (ref 78.0–100.0)
Platelets: 259 10*3/uL (ref 150–400)
RBC: 4.8 MIL/uL (ref 4.22–5.81)
RDW: 15.3 % (ref 11.5–15.5)
WBC: 8 10*3/uL (ref 4.0–10.5)

## 2017-08-10 LAB — COMPREHENSIVE METABOLIC PANEL
ALT: 29 U/L (ref 17–63)
AST: 50 U/L — ABNORMAL HIGH (ref 15–41)
Albumin: 3.3 g/dL — ABNORMAL LOW (ref 3.5–5.0)
Alkaline Phosphatase: 68 U/L (ref 38–126)
Anion gap: 14 (ref 5–15)
BUN: 23 mg/dL — ABNORMAL HIGH (ref 6–20)
CHLORIDE: 107 mmol/L (ref 101–111)
CO2: 21 mmol/L — AB (ref 22–32)
Calcium: 9 mg/dL (ref 8.9–10.3)
Creatinine, Ser: 0.94 mg/dL (ref 0.61–1.24)
Glucose, Bld: 84 mg/dL (ref 65–99)
POTASSIUM: 4.2 mmol/L (ref 3.5–5.1)
SODIUM: 142 mmol/L (ref 135–145)
Total Bilirubin: 1 mg/dL (ref 0.3–1.2)
Total Protein: 7.8 g/dL (ref 6.5–8.1)

## 2017-08-10 LAB — RESPIRATORY PANEL BY PCR
Adenovirus: NOT DETECTED
BORDETELLA PERTUSSIS-RVPCR: NOT DETECTED
CHLAMYDOPHILA PNEUMONIAE-RVPPCR: NOT DETECTED
CORONAVIRUS 229E-RVPPCR: NOT DETECTED
CORONAVIRUS HKU1-RVPPCR: NOT DETECTED
Coronavirus NL63: NOT DETECTED
Coronavirus OC43: NOT DETECTED
Influenza A: NOT DETECTED
Influenza B: NOT DETECTED
Metapneumovirus: NOT DETECTED
Mycoplasma pneumoniae: NOT DETECTED
Parainfluenza Virus 1: NOT DETECTED
Parainfluenza Virus 2: NOT DETECTED
Parainfluenza Virus 3: NOT DETECTED
Parainfluenza Virus 4: NOT DETECTED
Respiratory Syncytial Virus: NOT DETECTED
Rhinovirus / Enterovirus: NOT DETECTED

## 2017-08-10 LAB — URINE CULTURE: CULTURE: NO GROWTH

## 2017-08-10 LAB — CK: Total CK: 308 U/L (ref 49–397)

## 2017-08-10 MED ORDER — LABETALOL HCL 5 MG/ML IV SOLN
20.0000 mg | Freq: Once | INTRAVENOUS | Status: AC
  Administered 2017-08-10: 20 mg via INTRAVENOUS
  Filled 2017-08-10: qty 4

## 2017-08-10 MED ORDER — NITROGLYCERIN IN D5W 200-5 MCG/ML-% IV SOLN
0.0000 ug/min | INTRAVENOUS | Status: DC
  Administered 2017-08-10: 5 ug/min via INTRAVENOUS
  Filled 2017-08-10: qty 250

## 2017-08-10 MED ORDER — NICARDIPINE HCL IN NACL 20-0.86 MG/200ML-% IV SOLN
3.0000 mg/h | INTRAVENOUS | Status: DC
  Administered 2017-08-10 (×2): 5 mg/h via INTRAVENOUS
  Administered 2017-08-10: 6 mg/h via INTRAVENOUS
  Administered 2017-08-10: 5 mg/h via INTRAVENOUS
  Administered 2017-08-11: 15 mg/h via INTRAVENOUS
  Administered 2017-08-11: 7.5 mg/h via INTRAVENOUS
  Administered 2017-08-11 (×2): 5 mg/h via INTRAVENOUS
  Administered 2017-08-11: 15 mg/h via INTRAVENOUS
  Administered 2017-08-11: 7.5 mg/h via INTRAVENOUS
  Administered 2017-08-11: 12.5 mg/h via INTRAVENOUS
  Administered 2017-08-11: 15 mg/h via INTRAVENOUS
  Administered 2017-08-11: 7.5 mg/h via INTRAVENOUS
  Administered 2017-08-11: 10 mg/h via INTRAVENOUS
  Administered 2017-08-12 (×2): 7.5 mg/h via INTRAVENOUS
  Filled 2017-08-10 (×17): qty 200

## 2017-08-10 MED ORDER — ENALAPRILAT 1.25 MG/ML IV SOLN
1.2500 mg | Freq: Four times a day (QID) | INTRAVENOUS | Status: DC
  Administered 2017-08-10 – 2017-08-11 (×4): 1.25 mg via INTRAVENOUS
  Filled 2017-08-10 (×6): qty 1

## 2017-08-10 NOTE — Progress Notes (Signed)
PROGRESS NOTE    Jonathon [name]  UUV:[id number] DOB: [birth year] DOA: 08/09/2017 PCP: Marletta Lor, MD    Brief Narrative:  82 y.o. male with medical history significant of asthma, chronic diastolic CHF, chronic inflammatory demyelinating polyneuropathy, HTN initially presented to ED on 3/29 with concerns of new mental status changes with fevers, admitted for concerns for possible serotonin syndrome.  Assessment & Plan:   Principal Problem:   Metabolic encephalopathy Active Problems:   Essential hypertension   PACEMAKER, PERMANENT   CIDP (chronic inflammatory demyelinating polyneuropathy) (HCC)   Chronic diastolic congestive heart failure (HCC)   Acute encephalopathy   Dementia   1. Toxic-metabolic encephalopathy 1. Suspect related to possible serotonin syndrome 2. No infectious process identified at present 3. Viral respiratory panel is negative  4. Appreciate input by Neurology continued on 2mg  ativan q6hrs, IVF hydration was given at time of admission to SDU  2. Fever 1. Continued antipyretics as tolerated 2. Concerns for serotonin syndrome vs viral process per above 3. UA unremarkable. CXR clear. Resp viral panel is neg 4. Blood cx pending 5. Continue to hold off on further abx at this time 3. HTN 1. Patient had been refusing medications prior to admit 2. BP had been poorly controlled overnight 3. Wil continue patient on scheduled IV ACEI and IV beta blocker with anticipation to transition to home PO meds when mental status improves 4. Overnight required nitro gtt with little improvement in bp. Have transitioned to nicardipine gtt with improved BP 4. CIDP 1. Patient had been receiving IVIG prior to admit, last dose on 3/22 2. Will continue to monitor for now 3. Neurology consulted as per above 5. Chronic diastolic CHF 1. Clinically euvolemic 2. Hydration was provided overnight per neurology recommendations 3. Continue to monitor I/o and daily wts 4. Most recent  2d echo from 3/23 with EF normal LVEF and grade 1 diastolic dysfunction 6. Hx dementia 1. Currently encephalopathic per above 2. Will continue per above  DVT prophylaxis: heparin subq Code Status: Full Family Communication: Pt in room, family not at bedside Disposition Plan: Uncertain at this time  Consultants:   Neurology  Procedures:     Antimicrobials: Anti-infectives (From admission, onward)   Start     Dose/Rate Route Frequency Ordered Stop   08/09/17 1515  vancomycin (VANCOCIN) IVPB 1000 mg/200 mL premix     1,000 mg 200 mL/hr over 60 Minutes Intravenous  Once 08/09/17 1503 08/09/17 1837   08/09/17 1515  piperacillin-tazobactam (ZOSYN) IVPB 3.375 g     3.375 g 100 mL/hr over 30 Minutes Intravenous  Once 08/09/17 1503 08/09/17 1718       Subjective: More conversant and awake. Still confused  Objective: Vitals:   08/10/17 1300 08/10/17 1330 08/10/17 1400 08/10/17 1430  BP: (!) 163/85 (!) 171/85 (!) 166/82 (!) 160/85  Pulse: 92 93 95 97  Resp: (!) 21 (!) 21 15 (!) 28  Temp:      TempSrc:      SpO2: 95% 97% 95% 95%  Weight:      Height:        Intake/Output Summary (Last 24 hours) at 08/10/2017 1458 Last data filed at 08/10/2017 1400 Gross per 24 hour  Intake 1104.5 ml  Output -  Net 1104.5 ml   Filed Weights   08/09/17 1158 08/09/17 2100 08/10/17 0211  Weight: 79.4 kg (175 lb) 77.1 kg (169 lb 15.6 oz) 77.1 kg (169 lb 15.6 oz)    Examination:  General exam: Appears calm  and comfortable  Respiratory system: Clear to auscultation. Respiratory effort normal. Cardiovascular system: S1 & S2 heard, regular Gastrointestinal system: Abdomen is nondistended, soft and nontender. No organomegaly or masses felt. Normal bowel sounds heard. Central nervous system: Alert and oriented. No focal neurological deficits. Extremities: Symmetric 5 x 5 power. Skin: No rashes, lesions Psychiatry:Conversant, confused. Knows he is in hospital   Data Reviewed: I have  personally reviewed following labs and imaging studies  CBC: Recent Labs  Lab 08/05/17 0542 08/08/17 1917 08/09/17 1234 08/10/17 0331  WBC 4.6 7.2 7.9 8.0  NEUTROABS  --   --  5.2  --   HGB 11.0* 12.6* 11.3* 13.0  HCT 32.9* 36.9* 34.8* 40.4  MCV 82.5 82.7 83.3 84.2  PLT 180 229 254 937   Basic Metabolic Panel: Recent Labs  Lab 08/05/17 0542 08/08/17 1917 08/09/17 1234 08/10/17 0331  NA 140 138 141 142  K 3.7 4.8 4.7 4.2  CL 105 102 107 107  CO2 25 22 22  21*  GLUCOSE 98 96 82 84  BUN 20 25* 26* 23*  CREATININE 1.27* 1.17 1.07 0.94  CALCIUM 8.6* 9.3 8.9 9.0  MG 1.8  --   --   --    GFR: Estimated Creatinine Clearance: 65.8 mL/min (by C-G formula based on SCr of 0.94 mg/dL). Liver Function Tests: Recent Labs  Lab 08/08/17 1917 08/09/17 1234 08/10/17 0331  AST 90* 55* 50*  ALT 36 28 29  ALKPHOS 76 63 68  BILITOT 1.3* 1.2 1.0  PROT 8.1 7.2 7.8  ALBUMIN 3.3* 3.1* 3.3*   Recent Labs  Lab 08/09/17 1234  LIPASE 24   Recent Labs  Lab 08/08/17 2023  AMMONIA 14   Coagulation Profile: No results for input(s): INR, PROTIME in the last 168 hours. Cardiac Enzymes: Recent Labs  Lab 08/09/17 1234  CKTOTAL 644*   BNP (last 3 results) Recent Labs    01/06/17 1705  PROBNP 532.0*   HbA1C: No results for input(s): HGBA1C in the last 72 hours. CBG: Recent Labs  Lab 08/04/17 1136 08/04/17 1613 08/05/17 0721 08/05/17 1137 08/08/17 1858  GLUCAP 101* 104* 131* 114* 66   Lipid Profile: No results for input(s): CHOL, HDL, LDLCALC, TRIG, CHOLHDL, LDLDIRECT in the last 72 hours. Thyroid Function Tests: No results for input(s): TSH, T4TOTAL, FREET4, T3FREE, THYROIDAB in the last 72 hours. Anemia Panel: No results for input(s): VITAMINB12, FOLATE, FERRITIN, TIBC, IRON, RETICCTPCT in the last 72 hours. Sepsis Labs: Recent Labs  Lab 08/09/17 1241  LATICACIDVEN 1.43    Recent Results (from the past 240 hour(s))  Urine culture     Status: None   Collection  Time: 08/08/17  9:34 PM  Result Value Ref Range Status   Specimen Description URINE, CATHETERIZED  Final   Special Requests NONE  Final   Culture   Final    NO GROWTH Performed at [facility], [place] [address], [place], [place] [address]    Report Status 08/10/2017 FINAL  Final  Respiratory Panel by PCR     Status: None   Collection Time: 08/09/17 12:34 PM  Result Value Ref Range Status   Adenovirus NOT DETECTED NOT DETECTED Final   Coronavirus 229E NOT DETECTED NOT DETECTED Final   Coronavirus HKU1 NOT DETECTED NOT DETECTED Final   Coronavirus NL63 NOT DETECTED NOT DETECTED Final   Coronavirus OC43 NOT DETECTED NOT DETECTED Final   Metapneumovirus NOT DETECTED NOT DETECTED Final   Rhinovirus / Enterovirus NOT DETECTED NOT DETECTED Final  Influenza A NOT DETECTED NOT DETECTED Final   Influenza B NOT DETECTED NOT DETECTED Final   Parainfluenza Virus 1 NOT DETECTED NOT DETECTED Final   Parainfluenza Virus 2 NOT DETECTED NOT DETECTED Final   Parainfluenza Virus 3 NOT DETECTED NOT DETECTED Final   Parainfluenza Virus 4 NOT DETECTED NOT DETECTED Final   Respiratory Syncytial Virus NOT DETECTED NOT DETECTED Final   Bordetella pertussis NOT DETECTED NOT DETECTED Final   Chlamydophila pneumoniae NOT DETECTED NOT DETECTED Final   Mycoplasma pneumoniae NOT DETECTED NOT DETECTED Final    Comment: Performed at [facility], [place] [address] [place], [place] [address]  Rapid strep screen     Status: None   Collection Time: 08/09/17  5:39 PM  Result Value Ref Range Status   Streptococcus, Group A Screen (Direct) NEGATIVE NEGATIVE Final    Comment: (NOTE) A Rapid Antigen test may result negative if the antigen level in the sample is below the detection level of this test. The FDA has not cleared this test as a stand-alone test therefore the rapid antigen negative result has reflexed to a Group A Strep culture. Performed at [facility], [place] [address], [place], [place] [address]   Culture, group A strep     Status: None (Preliminary result)   Collection Time: 08/09/17  5:39 PM  Result Value Ref Range Status   Specimen Description   Final    THROAT Performed at [place] [address] [place], [place] [address]    Special Requests   Final    NONE Reflexed from [id number] Performed at [facility], [place] [address] [place], [place] [address]    Culture   Final    CULTURE REINCUBATED FOR BETTER GROWTH Performed at [facility], [place] [address] [place] [address] [place], [place] [address]    Report Status PENDING  Incomplete  MRSA PCR Screening     Status: None   Collection Time: 08/09/17  8:16 PM  Result Value Ref Range Status   MRSA by PCR NEGATIVE NEGATIVE Final    Comment:        The GeneXpert MRSA Assay (FDA approved for NASAL specimens only), is one component of a comprehensive MRSA colonization surveillance program. It is not intended to diagnose MRSA infection nor to guide or monitor treatment for MRSA infections. Performed at [facility], [place] [address] [place], [place] [address]      Radiology Studies: Dg Chest 1 View  Result Date: 08/08/2017 CLINICAL DATA:  Altered mental status EXAM: CHEST  1 VIEW COMPARISON:  08/01/2017 FINDINGS: Left-sided pacing device as before. No consolidation or effusion. Stable cardiomediastinal silhouette. No pneumothorax. IMPRESSION: No active disease. Electronically Signed   By: Donavan Foil M.D.   On: 08/08/2017 20:10   Dg Chest 2 View  Result Date: 08/09/2017 CLINICAL DATA:  Fever. EXAM: CHEST - 2 VIEW COMPARISON:  08/08/2017 FINDINGS: Left chest wall pacer device is noted with lead in the right atrial appendage and right ventricle. The heart size appears normal. There is no pleural effusion or edema. No airspace opacities. IMPRESSION: 1. No acute cardiopulmonary abnormalities. Electronically Signed   By: Kerby Moors M.D.   On:  08/09/2017 14:48   Ct Head Wo Contrast  Result Date: 08/08/2017 CLINICAL DATA:  Altered mental status.  Found down. EXAM: CT HEAD WITHOUT CONTRAST TECHNIQUE: Contiguous axial images were obtained from the base of the skull through the vertex without intravenous contrast. COMPARISON:  08/01/2017 FINDINGS: Brain: No mass lesion, intraparenchymal hemorrhage or extra-axial collection. No evidence of acute cortical infarct. Normal appearance of the brain parenchyma and extra axial spaces for age. Vascular: No hyperdense vessel or unexpected vascular calcification. Skull: Normal visualized skull base, calvarium and extracranial soft tissues. Sinuses/Orbits: No sinus fluid levels or advanced mucosal thickening. No mastoid effusion. Chronic fracture of the right lamina papyracea. IMPRESSION: Normal aging brain. Electronically Signed   By: Ulyses Jarred M.D.   On: 08/08/2017 20:08    Scheduled Meds: . enalaprilat  1.25 mg Intravenous Q6H  . heparin  5,000 Units Subcutaneous Q8H  . LORazepam  2 mg Intravenous Q6H  . metoprolol tartrate  5 mg Intravenous Q6H   Continuous Infusions: . niCARDipine 5 mg/hr (08/10/17 1204)     LOS: 1 day   Marylu Lund, MD [facility] Pager [phone]  If 7PM-7AM, please contact night-coverage [email or web address] Password [facility] 08/10/2017, 2:58 PM

## 2017-08-10 NOTE — Progress Notes (Signed)
SBP fluctuates between 180-200's, patient was given labetalol pushes, metoprolol, enalapril and hydralazine throughout the night. The BP would decrease for short periods but remained uncontrolled . [facility] and cardiology are aware and nitroglycerin drip was ordered.

## 2017-08-11 ENCOUNTER — Telehealth: Payer: Self-pay | Admitting: Internal Medicine

## 2017-08-11 DIAGNOSIS — R4182 Altered mental status, unspecified: Secondary | ICD-10-CM

## 2017-08-11 DIAGNOSIS — Z87891 Personal history of nicotine dependence: Secondary | ICD-10-CM

## 2017-08-11 DIAGNOSIS — G9340 Encephalopathy, unspecified: Secondary | ICD-10-CM

## 2017-08-11 DIAGNOSIS — Z818 Family history of other mental and behavioral disorders: Secondary | ICD-10-CM

## 2017-08-11 LAB — BASIC METABOLIC PANEL
ANION GAP: 13 (ref 5–15)
BUN: 30 mg/dL — ABNORMAL HIGH (ref 6–20)
CALCIUM: 8.9 mg/dL (ref 8.9–10.3)
CO2: 20 mmol/L — AB (ref 22–32)
CREATININE: 1.06 mg/dL (ref 0.61–1.24)
Chloride: 110 mmol/L (ref 101–111)
Glucose, Bld: 127 mg/dL — ABNORMAL HIGH (ref 65–99)
Potassium: 4 mmol/L (ref 3.5–5.1)
SODIUM: 143 mmol/L (ref 135–145)

## 2017-08-11 MED ORDER — FLECAINIDE ACETATE 50 MG PO TABS
75.0000 mg | ORAL_TABLET | Freq: Two times a day (BID) | ORAL | Status: DC
  Filled 2017-08-11: qty 2

## 2017-08-11 MED ORDER — ENALAPRILAT 1.25 MG/ML IV SOLN
1.2500 mg | Freq: Four times a day (QID) | INTRAVENOUS | Status: DC
  Administered 2017-08-11 – 2017-08-12 (×3): 1.25 mg via INTRAVENOUS
  Filled 2017-08-11 (×3): qty 1

## 2017-08-11 MED ORDER — FUROSEMIDE 20 MG PO TABS: 20.0000 mg | ORAL_TABLET | Freq: Every day | ORAL | Status: DC

## 2017-08-11 MED ORDER — METOPROLOL TARTRATE 5 MG/5ML IV SOLN
5.0000 mg | Freq: Four times a day (QID) | INTRAVENOUS | Status: DC
  Administered 2017-08-11 – 2017-08-12 (×4): 5 mg via INTRAVENOUS
  Filled 2017-08-11 (×4): qty 5

## 2017-08-11 MED ORDER — METOPROLOL SUCCINATE ER 25 MG PO TB24: 25.0000 mg | ORAL_TABLET | Freq: Every day | ORAL | Status: DC

## 2017-08-11 MED ORDER — LIP MEDEX EX OINT
TOPICAL_OINTMENT | CUTANEOUS | Status: AC
  Administered 2017-08-11: 04:00:00
  Filled 2017-08-11: qty 7

## 2017-08-11 MED ORDER — IRBESARTAN 300 MG PO TABS
300.0000 mg | ORAL_TABLET | Freq: Every day | ORAL | Status: DC
  Filled 2017-08-11: qty 1

## 2017-08-11 NOTE — Progress Notes (Signed)
PROGRESS NOTE    Jonathon [name]  JQZ:[id number] DOB: 31-Mar-1936 DOA: 08/09/2017 PCP: Marletta Lor, MD    Brief Narrative:  82 y.o. male with medical history significant of asthma, chronic diastolic CHF, chronic inflammatory demyelinating polyneuropathy, HTN initially presented to ED on 3/29 with concerns of new mental status changes with fevers, admitted for concerns for possible serotonin syndrome.  Assessment & Plan:   Principal Problem:   Metabolic encephalopathy Active Problems:   Essential hypertension   PACEMAKER, PERMANENT   CIDP (chronic inflammatory demyelinating polyneuropathy) (HCC)   Chronic diastolic congestive heart failure (HCC)   Acute encephalopathy   Dementia  1. Toxic-metabolic encephalopathy 1. Suspect related to possible serotonin syndrome 2. No infectious process identified at present 3. Viral respiratory panel is negative  4. Appreciate input by Neurology improvement noted with 2mg  ativan q6hrs, IVF hydration was given at time of admission to SDU  5. Neurology has since signed off with no further recommendations 6. Have consulted Psychiatry for assistance with medications as olanzapine has been discontinued 2. Fever 1. Now afebrile 2. Concerns for serotonin syndrome, trazadone and olanzapine discontinued 3. UA unremarkable. CXR clear. Resp viral panel is neg 4. Blood cx pending 5. Continue to hold off on further abx at this time 3. HTN 1. Patient had been refusing medications prior to admit 2. BP had been poorly controlled overnight 3. BP improved with scheduled IV ACEI and IV beta blocker with cardene gtt 4. Will resume home bp meds and continue to wean cardene gtt with goal of off 4. CIDP 1. Patient had been receiving IVIG prior to admit, last dose on 3/22 2. Will continue to monitor for now 3. Neurology has since signed off 5. Chronic diastolic CHF 1. Clinically euvolemic 2. Hydration was provided overnight per neurology  recommendations 3. Continue to monitor I/o and daily wts 4. Most recent 2d echo from 3/23 with EF normal LVEF and grade 1 diastolic dysfunction 6. Hx dementia 1. Patient appears more alert and conversant since admit 2. Olanzapine and trazadone stopped 3. Will consult Psychiatry for medication recommendations for agitation  DVT prophylaxis: heparin subq Code Status: Full Family Communication: Pt in room, family not at bedside Disposition Plan: Uncertain at this time  Consultants:   Neurology  Psychiatry  Procedures:     Antimicrobials: Anti-infectives (From admission, onward)   Start     Dose/Rate Route Frequency Ordered Stop   08/09/17 1515  vancomycin (VANCOCIN) IVPB 1000 mg/200 mL premix     1,000 mg 200 mL/hr over 60 Minutes Intravenous  Once 08/09/17 1503 08/09/17 1837   08/09/17 1515  piperacillin-tazobactam (ZOSYN) IVPB 3.375 g     3.375 g 100 mL/hr over 30 Minutes Intravenous  Once 08/09/17 1503 08/09/17 1718      Subjective: Conversant, awake, still confused  Objective: Vitals:   08/11/17 0354 08/11/17 0400 08/11/17 0600 08/11/17 0800  BP:  (!) 189/78 (!) 156/72   Pulse:  (!) 103 94   Resp:  19 18   Temp: 98.6 F (37 C)   97.6 F (36.4 C)  TempSrc: Axillary   Axillary  SpO2:  98% 98%   Weight: 78.1 kg (172 lb 2.9 oz)     Height:        Intake/Output Summary (Last 24 hours) at 08/11/2017 1126 Last data filed at 08/11/2017 0600 Gross per 24 hour  Intake 1245 ml  Output 450 ml  Net 795 ml   Filed Weights   08/09/17 2100 08/10/17 0211 08/11/17  0354  Weight: 77.1 kg (169 lb 15.6 oz) 77.1 kg (169 lb 15.6 oz) 78.1 kg (172 lb 2.9 oz)    Examination: General exam: Awake, laying in bed, in nad Respiratory system: Normal respiratory effort, no wheezing Cardiovascular system: regular rate, s1, s2 Gastrointestinal system: Soft, nondistended, positive BS Central nervous system: CN2-12 grossly intact, strength intact Extremities: Perfused, no  clubbing Skin: Normal skin turgor, no notable skin lesions seen Psychiatry: confused but conversant  Data Reviewed: I have personally reviewed following labs and imaging studies  CBC: Recent Labs  Lab 08/05/17 0542 08/08/17 1917 08/09/17 1234 08/10/17 0331  WBC 4.6 7.2 7.9 8.0  NEUTROABS  --   --  5.2  --   HGB 11.0* 12.6* 11.3* 13.0  HCT 32.9* 36.9* 34.8* 40.4  MCV 82.5 82.7 83.3 84.2  PLT 180 229 254 093   Basic Metabolic Panel: Recent Labs  Lab 08/05/17 0542 08/08/17 1917 08/09/17 1234 08/10/17 0331 08/11/17 0336  NA 140 138 141 142 143  K 3.7 4.8 4.7 4.2 4.0  CL 105 102 107 107 110  CO2 25 22 22  21* 20*  GLUCOSE 98 96 82 84 127*  BUN 20 25* 26* 23* 30*  CREATININE 1.27* 1.17 1.07 0.94 1.06  CALCIUM 8.6* 9.3 8.9 9.0 8.9  MG 1.8  --   --   --   --    GFR: Estimated Creatinine Clearance: 58.3 mL/min (by C-G formula based on SCr of 1.06 mg/dL). Liver Function Tests: Recent Labs  Lab 08/08/17 1917 08/09/17 1234 08/10/17 0331  AST 90* 55* 50*  ALT 36 28 29  ALKPHOS 76 63 68  BILITOT 1.3* 1.2 1.0  PROT 8.1 7.2 7.8  ALBUMIN 3.3* 3.1* 3.3*   Recent Labs  Lab 08/09/17 1234  LIPASE 24   Recent Labs  Lab 08/08/17 2023  AMMONIA 14   Coagulation Profile: No results for input(s): INR, PROTIME in the last 168 hours. Cardiac Enzymes: Recent Labs  Lab 08/09/17 1234 08/10/17 2102  CKTOTAL 644* 308   BNP (last 3 results) Recent Labs    01/06/17 1705  PROBNP 532.0*   HbA1C: No results for input(s): HGBA1C in the last 72 hours. CBG: Recent Labs  Lab 08/04/17 1136 08/04/17 1613 08/05/17 0721 08/05/17 1137 08/08/17 1858  GLUCAP 101* 104* 131* 114* 66   Lipid Profile: No results for input(s): CHOL, HDL, LDLCALC, TRIG, CHOLHDL, LDLDIRECT in the last 72 hours. Thyroid Function Tests: No results for input(s): TSH, T4TOTAL, FREET4, T3FREE, THYROIDAB in the last 72 hours. Anemia Panel: No results for input(s): VITAMINB12, FOLATE, FERRITIN, TIBC,  IRON, RETICCTPCT in the last 72 hours. Sepsis Labs: Recent Labs  Lab 08/09/17 1241  LATICACIDVEN 1.43    Recent Results (from the past 240 hour(s))  Urine culture     Status: None   Collection Time: 08/08/17  9:34 PM  Result Value Ref Range Status   Specimen Description URINE, CATHETERIZED  Final   Special Requests NONE  Final   Culture   Final    NO GROWTH Performed at [facility], [place] [address], [place], [place] [address]    Report Status 08/10/2017 FINAL  Final  Respiratory Panel by PCR     Status: None   Collection Time: 08/09/17 12:34 PM  Result Value Ref Range Status   Adenovirus NOT DETECTED NOT DETECTED Final   Coronavirus 229E NOT DETECTED NOT DETECTED Final   Coronavirus HKU1 NOT DETECTED NOT DETECTED Final   Coronavirus NL63 NOT DETECTED  NOT DETECTED Final   Coronavirus OC43 NOT DETECTED NOT DETECTED Final   Metapneumovirus NOT DETECTED NOT DETECTED Final   Rhinovirus / Enterovirus NOT DETECTED NOT DETECTED Final   Influenza A NOT DETECTED NOT DETECTED Final   Influenza B NOT DETECTED NOT DETECTED Final   Parainfluenza Virus 1 NOT DETECTED NOT DETECTED Final   Parainfluenza Virus 2 NOT DETECTED NOT DETECTED Final   Parainfluenza Virus 3 NOT DETECTED NOT DETECTED Final   Parainfluenza Virus 4 NOT DETECTED NOT DETECTED Final   Respiratory Syncytial Virus NOT DETECTED NOT DETECTED Final   Bordetella pertussis NOT DETECTED NOT DETECTED Final   Chlamydophila pneumoniae NOT DETECTED NOT DETECTED Final   Mycoplasma pneumoniae NOT DETECTED NOT DETECTED Final    Comment: Performed at [facility], [place] [address] [place], [place] [address]  Rapid strep screen     Status: None   Collection Time: 08/09/17  5:39 PM  Result Value Ref Range Status   Streptococcus, Group A Screen (Direct) NEGATIVE NEGATIVE Final    Comment: (NOTE) A Rapid Antigen test may result negative if the antigen level in the sample is below the detection level of this test. The FDA has  not cleared this test as a stand-alone test therefore the rapid antigen negative result has reflexed to a Group A Strep culture. Performed at [facility], [place] [address], [place], [place] [address]   Culture, group A strep     Status: None (Preliminary result)   Collection Time: 08/09/17  5:39 PM  Result Value Ref Range Status   Specimen Description   Final    THROAT Performed at [place] [address], [place], [place] [address]    Special Requests   Final    NONE Reflexed from [id number] Performed at [facility], [place] [address], [place], [place] [address]    Culture   Final    CULTURE REINCUBATED FOR BETTER GROWTH Performed at [facility], [place] [address] [place], [place] [address]    Report Status PENDING  Incomplete  MRSA PCR Screening     Status: None   Collection Time: 08/09/17  8:16 PM  Result Value Ref Range Status   MRSA by PCR NEGATIVE NEGATIVE Final    Comment:        The GeneXpert MRSA Assay (FDA approved for NASAL specimens only), is one component of a comprehensive MRSA colonization surveillance program. It is not intended to diagnose MRSA infection nor to guide or monitor treatment for MRSA infections. Performed at [facility], [place] [address], [place],  [address]      Radiology Studies: Dg Chest 2 View  Result Date: 08/09/2017 CLINICAL DATA:  Fever. EXAM: CHEST - 2 VIEW COMPARISON:  08/08/2017 FINDINGS: Left chest wall pacer device is noted with lead in the right atrial appendage and right ventricle. The heart size appears normal. There is no pleural effusion or edema. No airspace opacities. IMPRESSION: 1. No acute cardiopulmonary abnormalities. Electronically Signed   By: Kerby Moors M.D.   On: 08/09/2017 14:48    Scheduled Meds: . enalaprilat  1.25 mg Intravenous Q6H  . heparin  5,000 Units Subcutaneous Q8H  . LORazepam  2 mg Intravenous Q6H  .  metoprolol tartrate  5 mg Intravenous Q6H   Continuous Infusions: . niCARDipine 12.5 mg/hr (08/11/17 1109)     LOS: 2 days   Marylu Lund, MD [facility] Pager [phone]  If 7PM-7AM, please contact  night-coverage [email or web address] Password [email or web address] 08/11/2017, 11:26 AM

## 2017-08-11 NOTE — Telephone Encounter (Signed)
Copied from [place] [phone]. Topic: Quick Communication - See Telephone Encounter >> Aug 11, 2017  1:30 PM Boyd Kerbs wrote: CRM for notification.   Christopher at [facility] verbal orders for PT - 2 x 3 ; gait training and balance     See Telephone encounter for: 08/11/17.

## 2017-08-11 NOTE — Progress Notes (Signed)
Neurology sign off note  Subjective: Patient resting comfortably when entering the room.  Upon waking patient he remains confused believes that he is at a high school.  He does follow commands however if not spoken to he will fall back asleep. daughter wanted Ativan to be held.    Vitals:   08/11/17 0600 08/11/17 0800  BP: (!) 156/72   Pulse: 94   Resp: 18   Temp:  97.6 F (36.4 C)  SpO2: 98%    Gen: In bed, NAD Resp: non-labored breathing, no acute distress Abd: soft, nt   General: NAD Mental Status: Drowsy but awakens easily to voice, not oriented and believes that he is at high school and [place]. Speech hypophonic fluent without evidence of aphasia.  Follows simple commands. Cranial Nerves: II: Able to count fingers in all 4 quadrants, pupils equal, round, reactive to light and accommodation III,IV, VI: ptosis not present, extra-ocular motions intact bilaterally V,VII: smile symmetric, facial light touch sensation normal bilaterally VIII: hearing normal bilaterally IX,X: uvula rises symmetrically XI: bilateral shoulder shrug XII: midline tongue extension without atrophy or fasciculations  Motor: Being all extremities antigravity.  Initially when he attempts to follow my commands to move his extremities he stiffens his extremities up but after asking him to relax he easily relaxes.  He does have a postural tremor in all 4 extremities.  Resting I can easily moving all extremities and does not appear to have any increased tone Sensory: Pinprick and light touch intact throughout, bilaterally Deep Tendon Reflexes:  Depressed throughout  Plantars: Right: downgoing   Left: downgoing     Pertinent Labs:  CO2 20 Glucose 127 BUN 30 CK 308 down from 644  Brief summary of case/Impression:  Male with chronic inflammatory edema lean polyneuropathy status post pacemaker and nephrolithiasis.  He also has hypertension and chronic active decline for a few years.  Patient  was recently started on olanzapine and presents with altered mental status, fever, rigidity and hypertension.  There was concern for serotonin syndrome as he had a fever which was low-grade, CK which was 600.  His exam shows no significant rigidity, afebrile, heart rate of 99, blood pressure 156/72.  Lexapro, olanzapine and trazodone were stopped.  Patient was receiving IV fluids.  His CK has decreased to half of what it was when he came in.  Neurological Diagnoses: 1) possible serotonin syndrome versus metabolic encephalopathy.  Patient's symptoms of serotonin syndrome have significantly improved at this point.  Recommendations: 1) no further recommendations at this time 2) Neurology to sign off at this time. Please call with any further questions or concerns.

## 2017-08-11 NOTE — Telephone Encounter (Signed)
Okay for verbal orders.  Is not this patient presently hospitalized??

## 2017-08-11 NOTE — Consult Note (Addendum)
[facility] Consult   Reason for Consult:  Medication management of dementia  Referring Physician:  Dr. Wyline Copas  Patient Identification: KERBY BORNER MRN:  [id number] Principal Diagnosis: Altered mental status Diagnosis:   Patient Active Problem List   Diagnosis Date Noted  . Metabolic encephalopathy [O35.00] 08/09/2017  . Dementia [F03.90] 08/04/2017  . Seizure ([place]) [R56.9] 08/01/2017  . Thiamine deficiency [E51.9] 05/27/2017  . B12 deficiency [E53.8] 04/30/2017  . Alcoholism ([place]) [F10.20] 01/28/2017  . Acute encephalopathy [G93.40] 12/28/2016  . Hepatic steatosis [K76.0] 06/05/2016  . Physical deconditioning [R53.81] 06/05/2016  . Chronic diastolic congestive heart failure ([place]) [I50.32] 06/05/2016  . Orthostatic syncope [I95.1]   . Elevated liver function tests [R94.5]   . LOC (loss of consciousness) ([place]) [R40.20] 08/10/2014  . Syncope [R55] 08/10/2014  . CIDP (chronic inflammatory demyelinating polyneuropathy) ([place]) [G61.81] 08/08/2014  . Sustained SVT ([place]) [I47.1] 07/27/2014  . 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 Abnormal LFTs [R94.5] 08/06/2013  . Iron deficiency anemia [D50.9] 04/19/2013  . Muscle weakness [M62.81] 01/13/2013  . External hemorrhoids [K64.4] 03/23/2011  . Symptomatic bradycardia [R00.1] 10/25/2008  . NEPHROLITHIASIS [N20.0] 10/25/2008  . BENIGN PROSTATIC HYPERTROPHY, HX OF [Z87.898] 10/25/2008  . PACEMAKER, PERMANENT [Z95.0] 10/25/2008  . Essential hypertension [I10] 11/21/2006  . BPH (benign prostatic hyperplasia) [N40.0] 11/21/2006  . History of colonic polyps [Z86.010] 11/21/2006  . NEPHROLITHIASIS, HX OF [Z87.442] 11/21/2006    Total Time spent with patient: 1 hour  Subjective:   GLEN KESINGER is a 82 y.o. male patient admitted with possible serotonin syndrome.  HPI:   Per chart review, patient was admitted with new mental status change with fevers and concern for possible serotonin syndrome. Trazodone and Zyprexa  were discontinued on admission. He has a history of dementia. He was seen by neurology who recommended Ativan 2 mg q 6 hours and IVF. Home psychiatric medications include Lexapro 5 mg daily, Trazodone 50 mg qhs PRN for sleep and Zyprexa 5 mg qhs. According to [facility] review it indicates that patient was not taking Zyprexa on admission.    On interview, Mr. Schnabel reports that he is feeling okay.  He reports that he woke up here and felt like he had a seizure.  He reports that he has been more confused lately.  He believes that today is Thursday and reports it is Saturday when he asked the year.  He believes that he is a 82 y/o.  He also reports that he is at [facility].  He denies any problems with sleep or appetite.  He denies SI, HI or AVH.  He reports that his son cooks for him and his daughter also helps him with tasks at home.  He is able to bathe himself.  He uses a walker and a cane to ambulate.  He has a nurse who comes to help him at home.  Patient's daughter, [name] was spoken to by phone.  She reports that her father has been at [facility] for the past week.  He was started on Lexapro on Thursday and received 1 dose.  He does not have a psychiatric history.  He does have problems with memory loss and confusion.  She reports that he was also given Zyprexa and narcotic pain medications in the past week which caused him to appear catatonic and stiff.  Past Psychiatric History: Dementia  Risk to Self: Is patient at risk for suicide?: No Risk to Others:  None.  Denies HI. Prior Inpatient Therapy:  Denies Prior Outpatient Therapy:  Denies  Past Medical History:  Past Medical History:  Diagnosis Date  . Asthma   . BENIGN PROSTATIC HYPERTROPHY, HX OF 10/25/2008  . BRADYCARDIA 2005  . Chronic diastolic congestive heart failure ([place]) 06/05/2016  . CIDP (chronic inflammatory demyelinating polyneuropathy) ([place])   . HYPERTENSION 11/21/2006  . Iron deficiency anemia, unspecified 04/19/2013  .  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 PACEMAKER, PERMANENT 2005   Gen change 2014 Medtronic Adaptic L dual-chamber pacemaker, serial [id number]   . SVT (supraventricular tachycardia) ([place])   . TOBACCO ABUSE 10/25/2008   Quit 2012    Past Surgical History:  Procedure Laterality Date  . COLONOSCOPY  [id number]  . PACEMAKER INSERTION  2005  . PERMANENT PACEMAKER GENERATOR CHANGE N/A 06/19/2012   Procedure: PERMANENT PACEMAKER GENERATOR CHANGE;  Surgeon: [name], MD; Medtronic Adaptic L dual-chamber pacemaker, serial #[id number]    . SKIN GRAFT Right 1962   wrist   Family History:  Family History  Problem Relation Age of Onset  . Heart attack Father        Died, 11  . Kidney disease Father   . Parkinson's disease Mother        Died, 18  . Breast cancer Sister        Living, 83  . Diabetes type II Brother        Living, 40  . Breast cancer Sister        Living, 49  . Diabetes Daughter        Living, 50  . Diabetes Son        Living, 5  . Ovarian cancer Daughter   . Colon cancer Neg Hx   . Rectal cancer Neg Hx   . Stomach cancer Neg Hx    Family Psychiatric  History: Father with depression and anxiety. Social History:  Social History   Substance and Sexual Activity  Alcohol Use No  . Alcohol/week: 0.0 oz   Comment: 4 beers per day     Social History   Substance and Sexual Activity  Drug Use No    Social History   Socioeconomic History  . Marital status: Divorced    Spouse name: Not on file  . Number of children: 4  . Years of education: Not on file  . Highest education level: Not on file  Occupational History  . Occupation: ENVIROMENTAL Location manager: [employer]  . Occupation: retired  Scientific laboratory technician  . Financial resource strain: Not on file  . Food insecurity:    Worry: Not on file    Inability: Not on file  . Transportation needs:    Medical: Not on file    Non-medical: Not on file  Tobacco Use  .  Smoking status: Former Smoker    Packs/day: 0.25    Years: 46.00    Pack years: 11.50    Types: Cigarettes    Start date: 03/16/1965    Last attempt to quit: 06/15/2010    Years since quitting: 7.1  . Smokeless tobacco: Never Used  . Tobacco comment: quit 3 years ago  Substance and Sexual Activity  . Alcohol use: No    Alcohol/week: 0.0 oz    Comment: 4 beers per day  . Drug use: No  . Sexual activity: Not on file  Lifestyle  . Physical activity:    Days per week: Not on file    Minutes per session: Not on  file  . Stress: Not on file  Relationships  . Social connections:    Talks on phone: Not on file    Gets together: Not on file    Attends religious service: Not on file    Active member of club or organization: Not on file    Attends meetings of clubs or organizations: Not on file    Relationship status: Not on file  Other Topics Concern  . Not on file  Social History Narrative   Has relocated from [place] in 2007. 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   Additional Social History: He lives at [facility] for the past week.  He denies current alcohol or illicit substance use.  He is a English as a second language teacher.  He previously worked in the hospital in Probation officer.    Allergies:  No Known Allergies  Labs:  Results for orders placed or performed during the hospital encounter of 08/09/17 (from the past 48 hour(s))  CBC with Differential     Status: Abnormal   Collection Time: 08/09/17 12:34 PM  Result Value Ref Range   WBC 7.9 4.0 - 10.5 K/uL   RBC 4.18 (L) 4.22 - 5.81 MIL/uL   Hemoglobin 11.3 (L) 13.0 - 17.0 g/dL   HCT 34.8 (L) 39.0 - 52.0 %   MCV 83.3 78.0 - 100.0 fL   MCH 27.0 26.0 - 34.0 pg   MCHC 32.5 30.0 - 36.0 g/dL   RDW 15.2 11.5 - 15.5 %   Platelets 254 150 - 400 K/uL   Neutrophils Relative % 66 %   Neutro Abs 5.2 1.7 - 7.7 K/uL   Lymphocytes Relative 21 %   Lymphs Abs 1.6 0.7 - 4.0 K/uL   Monocytes Relative 13 %   Monocytes Absolute 1.0 0.1 -  1.0 K/uL   Eosinophils Relative 0 %   Eosinophils Absolute 0.0 0.0 - 0.7 K/uL   Basophils Relative 0 %   Basophils Absolute 0.0 0.0 - 0.1 K/uL    Comment: Performed at [facility], [place] [address] [place], [place] [address]  Comprehensive metabolic panel     Status: Abnormal   Collection Time: 08/09/17 12:34 PM  Result Value Ref Range   Sodium 141 135 - 145 mmol/L   Potassium 4.7 3.5 - 5.1 mmol/L   Chloride 107 101 - 111 mmol/L   CO2 22 22 - 32 mmol/L   Glucose, Bld 82 65 - 99 mg/dL   BUN 26 (H) 6 - 20 mg/dL   Creatinine, Ser 1.07 0.61 - 1.24 mg/dL   Calcium 8.9 8.9 - 10.3 mg/dL   Total Protein 7.2 6.5 - 8.1 g/dL   Albumin 3.1 (L) 3.5 - 5.0 g/dL   AST 55 (H) 15 - 41 U/L   ALT 28 17 - 63 U/L   Alkaline Phosphatase 63 38 - 126 U/L   Total Bilirubin 1.2 0.3 - 1.2 mg/dL   GFR calc non Af Amer >60 >60 mL/min   GFR calc Af Amer >60 >60 mL/min    Comment: (NOTE) The eGFR has been calculated using the CKD EPI equation. This calculation has not been validated in all clinical situations. eGFR's persistently <60 mL/min signify possible Chronic Kidney Disease.    Anion gap 12 5 - 15    Comment: Performed at [facility], [place] [address], [place], [place] [address]  Lipase, blood     Status: None   Collection Time: 08/09/17 12:34 PM  Result Value Ref  Range   Lipase 24 11 - 51 U/L    Comment: Performed at [facility], [place] [address] [place], [place] [address]  Influenza panel by PCR (type A & B)     Status: None   Collection Time: 08/09/17 12:34 PM  Result Value Ref Range   Influenza A By PCR NEGATIVE NEGATIVE   Influenza B By PCR NEGATIVE NEGATIVE    Comment: (NOTE) The Xpert Xpress Flu assay is intended as an aid in the diagnosis of  influenza and should not be used as a sole basis for treatment.  This  assay is FDA approved for nasopharyngeal swab specimens only. Nasal  washings and aspirates are unacceptable for Xpert  Xpress Flu testing. Performed at [facility], [place] [address] [place], [place] [address]   Respiratory Panel by PCR     Status: None   Collection Time: 08/09/17 12:34 PM  Result Value Ref Range   Adenovirus NOT DETECTED NOT DETECTED   Coronavirus 229E NOT DETECTED NOT DETECTED   Coronavirus HKU1 NOT DETECTED NOT DETECTED   Coronavirus NL63 NOT DETECTED NOT DETECTED   Coronavirus OC43 NOT DETECTED NOT DETECTED   Metapneumovirus NOT DETECTED NOT DETECTED   Rhinovirus / Enterovirus NOT DETECTED NOT DETECTED   Influenza A NOT DETECTED NOT DETECTED   Influenza B NOT DETECTED NOT DETECTED   Parainfluenza Virus 1 NOT DETECTED NOT DETECTED   Parainfluenza Virus 2 NOT DETECTED NOT DETECTED   Parainfluenza Virus 3 NOT DETECTED NOT DETECTED   Parainfluenza Virus 4 NOT DETECTED NOT DETECTED   Respiratory Syncytial Virus NOT DETECTED NOT DETECTED   Bordetella pertussis NOT DETECTED NOT DETECTED   Chlamydophila pneumoniae NOT DETECTED NOT DETECTED   Mycoplasma pneumoniae NOT DETECTED NOT DETECTED    Comment: Performed at [place] [address], [place], [place] [address]  CK     Status: Abnormal   Collection Time: 08/09/17 12:34 PM  Result Value Ref Range   Total CK 644 (H) 49 - 397 U/L    Comment: Performed at [facility], [place] [address] [place], [place] [address]  I-Stat CG4 Lactic Acid, ED     Status: None   Collection Time: 08/09/17 12:41 PM  Result Value Ref Range   Lactic Acid, Venous 1.43 0.5 - 1.9 mmol/L  Rapid strep screen     Status: None   Collection Time: 08/09/17  5:39 PM  Result Value Ref Range   Streptococcus, Group A Screen (Direct) NEGATIVE NEGATIVE    Comment: (NOTE) A Rapid Antigen test may result negative if the antigen level in the sample is below the detection level of this test. The FDA has not cleared this test as a stand-alone test therefore the rapid antigen negative result has reflexed to a Group A Strep  culture. Performed at [facility], [place] [address] [place], [place] [address]   Culture, group A strep     Status: None (Preliminary result)   Collection Time: 08/09/17  5:39 PM  Result Value Ref Range   Specimen Description      THROAT Performed at [place] [address] [place], [place] [address]    Special Requests      NONE Reflexed from [id number] Performed at [facility], [place] [address] [place], [place] [address]    Culture      CULTURE REINCUBATED FOR BETTER GROWTH Performed at [facility], [place] [address]  [place], [place] [address]    Report Status PENDING   MRSA PCR Screening     Status: None   Collection Time: 08/09/17  8:16 PM  Result Value Ref Range   MRSA by PCR NEGATIVE NEGATIVE    Comment:        The GeneXpert MRSA Assay (FDA approved for NASAL specimens only), is one component of a comprehensive MRSA colonization surveillance program. It is not intended to diagnose MRSA infection nor to guide or monitor treatment for MRSA infections. Performed at [facility], [place] [address], [place], [place] [address]   Comprehensive metabolic panel     Status: Abnormal   Collection Time: 08/10/17  3:31 AM  Result Value Ref Range   Sodium 142 135 - 145 mmol/L   Potassium 4.2 3.5 - 5.1 mmol/L   Chloride 107 101 - 111 mmol/L   CO2 21 (L) 22 - 32 mmol/L   Glucose, Bld 84 65 - 99 mg/dL   BUN 23 (H) 6 - 20 mg/dL   Creatinine, Ser 0.94 0.61 - 1.24 mg/dL   Calcium 9.0 8.9 - 10.3 mg/dL   Total Protein 7.8 6.5 - 8.1 g/dL   Albumin 3.3 (L) 3.5 - 5.0 g/dL   AST 50 (H) 15 - 41 U/L   ALT 29 17 - 63 U/L   Alkaline Phosphatase 68 38 - 126 U/L   Total Bilirubin 1.0 0.3 - 1.2 mg/dL   GFR calc non Af Amer >60 >60 mL/min   GFR calc Af Amer >60 >60 mL/min    Comment: (NOTE) The eGFR has been calculated using the CKD EPI equation. This calculation has not been validated in all clinical  situations. eGFR's persistently <60 mL/min signify possible Chronic Kidney Disease.    Anion gap 14 5 - 15    Comment: Performed at [facility], [place] [address] [place], [place] [address]  CBC     Status: None   Collection Time: 08/10/17  3:31 AM  Result Value Ref Range   WBC 8.0 4.0 - 10.5 K/uL   RBC 4.80 4.22 - 5.81 MIL/uL   Hemoglobin 13.0 13.0 - 17.0 g/dL   HCT 40.4 39.0 - 52.0 %   MCV 84.2 78.0 - 100.0 fL   MCH 27.1 26.0 - 34.0 pg   MCHC 32.2 30.0 - 36.0 g/dL   RDW 15.3 11.5 - 15.5 %   Platelets 259 150 - 400 K/uL    Comment: Performed at [facility], [place] [address] [place] [address], [place], [place] [address]  CK     Status: None   Collection Time: 08/10/17  9:02 PM  Result Value Ref Range   Total CK 308 49 - 397 U/L    Comment: Performed at [facility], [place] [address] [place], [place] [address]  Basic metabolic panel     Status: Abnormal   Collection Time: 08/11/17  3:36 AM  Result Value Ref Range   Sodium 143 135 - 145 mmol/L   Potassium 4.0 3.5 - 5.1 mmol/L   Chloride 110 101 - 111 mmol/L   CO2 20 (L) 22 - 32 mmol/L   Glucose, Bld 127 (H) 65 - 99 mg/dL   BUN 30 (H) 6 - 20 mg/dL   Creatinine, Ser 1.06 0.61 - 1.24 mg/dL   Calcium 8.9 8.9 - 10.3 mg/dL   GFR calc non Af Amer >60 >60 mL/min   GFR calc Af Amer >60 >60 mL/min    Comment: (NOTE) The  eGFR has been calculated using the CKD EPI equation. This calculation has not been validated in all clinical situations. eGFR's persistently <60 mL/min signify possible Chronic Kidney Disease.    Anion gap 13 5 - 15    Comment: Performed at [facility], [place] [address], [place], [place] [address]    Current Facility-Administered Medications  Medication Dose Route Frequency Provider Last Rate Last Dose  . acetaminophen (TYLENOL) tablet 650 mg  650 mg Oral Q6H PRN Donne Hazel, MD       Or  . acetaminophen (TYLENOL) suppository 650 mg  650 mg Rectal Q6H PRN  Donne Hazel, MD      . flecainide [facility]) tablet 75 mg  75 mg Oral BID Donne Hazel, MD      . furosemide (LASIX) tablet 20 mg  20 mg Oral Daily Donne Hazel, MD      . heparin injection 5,000 Units  5,000 Units Subcutaneous Q8H Donne Hazel, MD   5,000 Units at 08/11/17 0535  . hydrALAZINE (APRESOLINE) injection 10 mg  10 mg Intravenous Q4H PRN Vertis Kelch, NP   10 mg at 08/10/17 0209  . irbesartan (AVAPRO) tablet 300 mg  300 mg Oral Daily Donne Hazel, MD      . labetalol (NORMODYNE,TRANDATE) injection 5 mg  5 mg Intravenous Q2H PRN Vertis Kelch, NP   5 mg at 08/10/17 1602  . LORazepam (ATIVAN) injection 2 mg  2 mg Intravenous Q6H Donne Hazel, MD   2 mg at 08/10/17 1745  . metoprolol succinate (TOPROL-XL) 24 hr tablet 25 mg  25 mg Oral Daily Donne Hazel, MD      . nicardipine (CARDENE) 80m in 0.86% saline 2081mIV infusion (0.1 mg/ml)  3-15 mg/hr Intravenous Continuous ChDonne HazelMD 125 mL/hr at 08/11/17 1109 12.5 mg/hr at 08/11/17 1109    Musculoskeletal: Strength & Muscle Tone: decreased due to physical deconditioning. Gait & Station: UTA due to patient lying in bed. Patient leans: N/A  Psychiatric Specialty Exam: Physical Exam  Nursing note and vitals reviewed. Constitutional: He is oriented to person, place, and time. He appears well-developed and well-nourished.  HENT:  Head: Normocephalic and atraumatic.  Neck: Normal range of motion.  Respiratory: Effort normal.  Musculoskeletal: Normal range of motion.  Neurological: He is alert and oriented to person, place, and time.  Skin: No rash noted.  Psychiatric: He has a normal mood and affect. His speech is normal and behavior is normal. Judgment and thought content normal. Cognition and memory are impaired.    Review of Systems  Constitutional: Negative for chills and fever.  Cardiovascular: Negative for chest pain.  Gastrointestinal: Positive for constipation and diarrhea.  Negative for nausea and vomiting.  Psychiatric/Behavioral: Negative for hallucinations, substance abuse and suicidal ideas. The patient does not have insomnia.   All other systems reviewed and are negative.   Blood pressure (!) 156/72, pulse 94, temperature 97.6 F (36.4 C), temperature source Axillary, resp. rate 18, height _0  (1.778 m), weight 78.1 kg (172 lb 2.9 oz), SpO2 98 %.Body mass index is 24.71 kg/m.  General Appearance: Fairly Groomed, elderly, African American male, wearing a hospital gown with unshaved face and hand mitts. NAD.   Eye Contact:  Good  Speech:  Clear and Coherent and Normal Rate  Volume:  Normal  Mood:  Euthymic  Affect:  Appropriate and Full Range  Thought Process:  Goal Directed, Linear and Descriptions of Associations:  Intact  Orientation:  Other:  Person only.  Thought Content:  Logical  Suicidal Thoughts:  No  Homicidal Thoughts:  No  Memory:  Immediate;   Poor Recent;   Fair Remote;   Fair  Judgement:  Fair  Insight:  Fair  Psychomotor Activity:  Normal  Concentration:  Concentration: Good and Attention Span: Good  Recall:  [employer] of Knowledge:  Fair  Language:  Fair  Akathisia:  No  Handed:  Right  AIMS (if indicated):   N/A  Assets:  Communication Skills Housing Social Support  ADL's:  Impaired  Cognition:  Impaired with short term memory deficits.   Sleep:   Okay   Assessment:  Jonathon [name] is a 82 y.o. male who was admitted with altered mental status, fever, hypertension, rigidity and concern for serotonin syndrome. His symptoms are more consistent with NMS. It appears that he was started on multiple medications in a short time period. Recommend starting Melatonin to regulate sleep/wake cycle and not restarting prior medications due to presenting symptoms. His daughter reports no prior psychiatric history and mostly confusion/memory loss. He can follow up with his PCP as needed for further medication management.   Treatment Plan  Summary: -Start Melatonin 3 mg qhs for sleep/wake cycle. -Recommend against restarting Lexapro, Trazodone or Zyprexa given presenting symptoms (NMS versus serotonin syndrome). -Patient should follow up with his PCP for further medication management. -Recommend follow up with neurology for dementia and cognitive memory enhancers if warranted.  -Psychiatry will sign off on patient at this time. Please consult psychiatry again as needed.   Disposition: No evidence of imminent risk to self or others at present.   Patient does not meet criteria for psychiatric inpatient admission.  Faythe Dingwall, DO 08/11/2017 12:28 PM

## 2017-08-12 LAB — BASIC METABOLIC PANEL
Anion gap: 10 (ref 5–15)
BUN: 45 mg/dL — AB (ref 6–20)
CHLORIDE: 110 mmol/L (ref 101–111)
CO2: 21 mmol/L — AB (ref 22–32)
CREATININE: 1.74 mg/dL — AB (ref 0.61–1.24)
Calcium: 8.5 mg/dL — ABNORMAL LOW (ref 8.9–10.3)
GFR calc Af Amer: 41 mL/min — ABNORMAL LOW (ref >60)
GFR calc non Af Amer: 36 mL/min — ABNORMAL LOW (ref >60)
Glucose, Bld: 128 mg/dL — ABNORMAL HIGH (ref 65–99)
POTASSIUM: 3.6 mmol/L (ref 3.5–5.1)
Sodium: 141 mmol/L (ref 135–145)

## 2017-08-12 LAB — CBC
HCT: 32.3 % — ABNORMAL LOW (ref 39.0–52.0)
Hemoglobin: 10.4 g/dL — ABNORMAL LOW (ref 13.0–17.0)
MCH: 26.8 pg (ref 26.0–34.0)
MCHC: 32.2 g/dL (ref 30.0–36.0)
MCV: 83.2 fL (ref 78.0–100.0)
PLATELETS: 304 10*3/uL (ref 150–400)
RBC: 3.88 MIL/uL — AB (ref 4.22–5.81)
RDW: 15.5 % (ref 11.5–15.5)
WBC: 9 10*3/uL (ref 4.0–10.5)

## 2017-08-12 LAB — CULTURE, GROUP A STREP (THRC)

## 2017-08-12 MED ORDER — ORAL CARE MOUTH RINSE
15.0000 mL | Freq: Two times a day (BID) | OROMUCOSAL | Status: DC
  Administered 2017-08-12 – 2017-08-16 (×8): 15 mL via OROMUCOSAL

## 2017-08-12 MED ORDER — MELATONIN 3 MG PO TABS
3.0000 mg | ORAL_TABLET | Freq: Every day | ORAL | Status: DC
  Administered 2017-08-12 – 2017-08-15 (×4): 3 mg via ORAL
  Filled 2017-08-12 (×6): qty 1

## 2017-08-12 MED ORDER — SODIUM CHLORIDE 0.9 % IV SOLN
INTRAVENOUS | Status: AC
  Administered 2017-08-12: 11:00:00 via INTRAVENOUS

## 2017-08-12 MED ORDER — METOPROLOL TARTRATE 25 MG PO TABS
12.5000 mg | ORAL_TABLET | Freq: Two times a day (BID) | ORAL | Status: DC
  Administered 2017-08-12 – 2017-08-14 (×4): 12.5 mg via ORAL
  Filled 2017-08-12 (×4): qty 1

## 2017-08-12 MED ORDER — POLYETHYLENE GLYCOL 3350 17 G PO PACK
17.0000 g | PACK | Freq: Every day | ORAL | Status: DC
  Administered 2017-08-12 – 2017-08-16 (×5): 17 g via ORAL
  Filled 2017-08-12 (×5): qty 1

## 2017-08-12 NOTE — Telephone Encounter (Signed)
Yes he was previously hospitalized and the verbal orders came.

## 2017-08-12 NOTE — Care Management Note (Signed)
Case Management Note  Patient Details  Name: Jonathon [name] MRN: [id number] Date of Birth: 11/24/1935  Subjective/Objective: Active w/[facility] rep Ronalee Belts aware & following-HHRN/PT/OT/social Insurance underwriter.                   Action/Plan:d/c home w/HHC.   Expected Discharge Date:  (unknown )               Expected Discharge Plan:     In-House Referral:     Discharge planning Services     Post Acute Care Choice:    Choice offered to:     DME Arranged:    DME Agency:     HH Arranged:    HH Agency:     Status of Service:     If discussed at [employer], dates discussed:    Additional Comments:  Dessa Phi, RN 08/12/2017, 3:20 PM

## 2017-08-12 NOTE — Clinical Social Work Note (Signed)
Clinical Social Work Assessment  Patient Details  Name: Jonathon [name] MRN: [id number] Date of Birth: 1935/11/10  Date of referral:  08/12/17               Reason for consult:  Facility Placement                Permission sought to share information with:    Permission granted to share information::     Name::        Agency::  [facility]  Relationship::   Daughter   Contact Information:     Housing/Transportation Living arrangements for the past 2 months:  [place], Single Family Home Source of Information:  Patient Patient Interpreter Needed:  None Criminal Activity/Legal Involvement Pertinent to Current Situation/Hospitalization:  No - Comment as needed Significant Relationships:  Adult Children Lives with:  Facility Resident Do you feel safe going back to the place where you live?  Yes Need for family participation in patient care:  Yes (Comment)(Patient has confusion, per daughter cannot lives independently)  Care giving concerns:   Patient has not been eating or drinking. Lethargy/Altered Mental Status.   Social Worker assessment / plan:  Patient alert x2/resting. CSW talk with the daughter via phone to discuss patient discharge plans. She reports the patient recently transition from his private residence to an Environmental consultant living [employer]. Patient daughter states he moved in about three days ago prior to being admitted. She reports over the course of 6 months the patient cognitive and physically decline. The patient has neuropathy and episodes of syncope. In the past week the patient has refused to eat to take his medications. He is incontinent at times.    Patient daughter reports it is unsafe for the patient to live alone. She reports concerns with continuing to pay privately at the ALF since the patient has only enough funds for about another few months. He recieves a monthly income of about $3,000 dollars through social  security and a pension but is she is unsure if this is correct. She reports they are in the process of saleing the patient home.   She is proactively working with the [facility] at this time to determine if there are other funds available to support the patient. The patient daughter was also given direction from the [place] to apply for medicaid through the department of social services. Patient daughter understands the patient may not qualify due to his current income.   CSW explain PT evaluation/ recommendation by PT .The SNF process and later providing SNF bed offers.CSW explain Medicare only covering first 20 days at 100 percent for physical therapy. Patient daughter reports understanding.   Plan: to be determine, back to ALF vs. Short rehab at [facility] after PT evaluates.   Employment status:  Retired Forensic scientist:  Medicare PT Recommendations:  Not assessed at this time Information / Referral to community resources:  [place]  Patient/Family's Response to care:  Patient daughter appreciative of CSW listening and assisting with the patient placement.   Patient/Family's Understanding of and Emotional Response to Diagnosis, Current Treatment, and Prognosis: Patient daughter is very involved  in patient care and has good understanding of pt. Diagnosis and treatment. She has been proactively in finding a long term payor source for patient care.   Emotional Assessment Appearance:  Appears stated age Attitude/Demeanor/Rapport:    Affect (typically observed):    Orientation:  Oriented to Self, Oriented to Place Alcohol / Substance  use:  Not Applicable Psych involvement (Current and /or in the community):  Yes (Comment)(Yes,Medication Managment )  Discharge Needs  Concerns to be addressed:  Discharge Planning Concerns Readmission within the last 30 days:  No Current discharge risk:  Dependent with Mobility Barriers to Discharge:  Continued Medical Work up   [employer],  LCSW 08/12/2017, 2:59 PM

## 2017-08-12 NOTE — Progress Notes (Signed)
PROGRESS NOTE    Jonathon [name]  ZOX:[id number] DOB: Feb 22, 1936 DOA: 08/09/2017 PCP: Marletta Lor, MD    Brief Narrative:  82 y.o. male with medical history significant of asthma, chronic diastolic CHF, chronic inflammatory demyelinating polyneuropathy, HTN initially presented to ED on 3/29 with concerns of new mental status changes with fevers, admitted for concerns for possible serotonin syndrome.  Assessment & Plan:   Principal Problem:   Altered mental status Active Problems:   Essential hypertension   PACEMAKER, PERMANENT   CIDP (chronic inflammatory demyelinating polyneuropathy) (HCC)   Chronic diastolic congestive heart failure (HCC)   Acute encephalopathy   Dementia   Metabolic encephalopathy  1. Toxic-metabolic encephalopathy 1. Suspect related to possible serotonin syndrome 2. No infectious process identified at present 3. Viral respiratory panel is negative  4. Appreciate input by Neurology improvement noted with 2mg  ativan q6hrs, IVF hydration was given at time of admission to SDU  5. Neurology has since signed off with no further recommendations 6. Have consulted Psychiatry for assistance with medications. Recommendations to avoid restarting Lexapro, Trazadone, Zyprexa 7. Recommendations for Melatonin 3mg  qhs. Will order 8. Overall improved  2. Fever 1. Presenting fevers, now afebrile 2. Concerns for serotonin syndrome, trazadone and olanzapine and lexapro discontinued perr above 3. UA unremarkable. CXR clear. Resp viral panel is neg 4. Blood cx pending 5. Continue to hold off on further abx at this time 3. HTN 1. Patient had been refusing medications prior to admit 2. BP had been poorly controlled on presentation 3. BP improved with scheduled IV ACEI and IV beta blocker with cardene gtt 4. SBP now in the 100's. Cr elevated at 1.4, see below 5. Will continue patient on 12.5mg  BID metoprolol with PRN's, hold ARB or ACEI until renal function  improves 4. CIDP 1. Patient had been receiving IVIG prior to admit, last dose on 3/22 2. Will continue to monitor for now, stable 3. Neurology has since signed off 5. Chronic diastolic CHF 1. Clinically euvolemic 2. Hydration was provided overnight per neurology recommendations 3. Continue to monitor I/o and daily wts 4. Most recent 2d echo from 3/23 with EF normal LVEF and grade 1 diastolic dysfunction 5. Currently stable 6. Hx dementia 1. Patient appears more alert and conversant since admit 2. Olanzapine and trazadone stopped 3. Appreciate input by Psychiatry. Recommendations for melatonin qhs 7. ARF secondary to bladder outlet obstruction 1. >1000cc on bladder scan overnight 2. Foley cath placed with over 1L out 3. Cr up to 1.74 this AM (baseline is normal renal function) 4. Have started IVF at 75cc/hr and will continue for 12hrs given hx of CHF 5. Hold ACEI 6. Repeat bmet in AM  DVT prophylaxis: heparin subq Code Status: Full Family Communication: Pt in room, family not at bedside Disposition Plan: Uncertain at this time  Consultants:   Neurology  Psychiatry  Procedures:     Antimicrobials: Anti-infectives (From admission, onward)   Start     Dose/Rate Route Frequency Ordered Stop   08/09/17 1515  vancomycin (VANCOCIN) IVPB 1000 mg/200 mL premix     1,000 mg 200 mL/hr over 60 Minutes Intravenous  Once 08/09/17 1503 08/09/17 1837   08/09/17 1515  piperacillin-tazobactam (ZOSYN) IVPB 3.375 g     3.375 g 100 mL/hr over 30 Minutes Intravenous  Once 08/09/17 1503 08/09/17 1718      Subjective: Remains confused, no complaints  Objective: Vitals:   08/12/17 1200 08/12/17 1300 08/12/17 1326 08/12/17 1400  BP: (!) 112/48 (!) 146/57 Marland Kitchen)  125/39 (!) 120/58  Pulse: 73 89 86 81  Resp: 18 15 15 19   Temp: 98 F (36.7 C)     TempSrc: Axillary     SpO2: 99% 98% 99% 99%  Weight:      Height:        Intake/Output Summary (Last 24 hours) at 08/12/2017 1531 Last data  filed at 08/12/2017 1043 Gross per 24 hour  Intake 2381.67 ml  Output 1750 ml  Net 631.67 ml   Filed Weights   08/09/17 2100 08/10/17 0211 08/11/17 0354  Weight: 77.1 kg (169 lb 15.6 oz) 77.1 kg (169 lb 15.6 oz) 78.1 kg (172 lb 2.9 oz)    Examination: General exam: Conversant, in no acute distress Respiratory system: normal chest rise, clear, no audible wheezing Cardiovascular system: regular rhythm, s1-s2 Gastrointestinal system: Nondistended, nontender, pos BS Central nervous system: No seizures, no tremors Extremities: No cyanosis, no joint deformities Skin: No rashes, no pallor Psychiatry: Affect normal // no auditory hallucinations   Data Reviewed: I have personally reviewed following labs and imaging studies  CBC: Recent Labs  Lab 08/08/17 1917 08/09/17 1234 08/10/17 0331 08/12/17 0339  WBC 7.2 7.9 8.0 9.0  NEUTROABS  --  5.2  --   --   HGB 12.6* 11.3* 13.0 10.4*  HCT 36.9* 34.8* 40.4 32.3*  MCV 82.7 83.3 84.2 83.2  PLT 229 254 259 938   Basic Metabolic Panel: Recent Labs  Lab 08/08/17 1917 08/09/17 1234 08/10/17 0331 08/11/17 0336 08/12/17 0339  NA 138 141 142 143 141  K 4.8 4.7 4.2 4.0 3.6  CL 102 107 107 110 110  CO2 22 22 21* 20* 21*  GLUCOSE 96 82 84 127* 128*  BUN 25* 26* 23* 30* 45*  CREATININE 1.17 1.07 0.94 1.06 1.74*  CALCIUM 9.3 8.9 9.0 8.9 8.5*   GFR: Estimated Creatinine Clearance: 35.5 mL/min (A) (by C-G formula based on SCr of 1.74 mg/dL (H)). Liver Function Tests: Recent Labs  Lab 08/08/17 1917 08/09/17 1234 08/10/17 0331  AST 90* 55* 50*  ALT 36 28 29  ALKPHOS 76 63 68  BILITOT 1.3* 1.2 1.0  PROT 8.1 7.2 7.8  ALBUMIN 3.3* 3.1* 3.3*   Recent Labs  Lab 08/09/17 1234  LIPASE 24   Recent Labs  Lab 08/08/17 2023  AMMONIA 14   Coagulation Profile: No results for input(s): INR, PROTIME in the last 168 hours. Cardiac Enzymes: Recent Labs  Lab 08/09/17 1234 08/10/17 2102  CKTOTAL 644* 308   BNP (last 3  results) Recent Labs    01/06/17 1705  PROBNP 532.0*   HbA1C: No results for input(s): HGBA1C in the last 72 hours. CBG: Recent Labs  Lab 08/08/17 1858  GLUCAP 66   Lipid Profile: No results for input(s): CHOL, HDL, LDLCALC, TRIG, CHOLHDL, LDLDIRECT in the last 72 hours. Thyroid Function Tests: No results for input(s): TSH, T4TOTAL, FREET4, T3FREE, THYROIDAB in the last 72 hours. Anemia Panel: No results for input(s): VITAMINB12, FOLATE, FERRITIN, TIBC, IRON, RETICCTPCT in the last 72 hours. Sepsis Labs: Recent Labs  Lab 08/09/17 1241  LATICACIDVEN 1.43    Recent Results (from the past 240 hour(s))  Urine culture     Status: None   Collection Time: 08/08/17  9:34 PM  Result Value Ref Range Status   Specimen Description URINE, CATHETERIZED  Final   Special Requests NONE  Final   Culture   Final    NO GROWTH Performed at [facility], [place] [address]  [place], [place] [address]    Report Status 08/10/2017 FINAL  Final  Respiratory Panel by PCR     Status: None   Collection Time: 08/09/17 12:34 PM  Result Value Ref Range Status   Adenovirus NOT DETECTED NOT DETECTED Final   Coronavirus 229E NOT DETECTED NOT DETECTED Final   Coronavirus HKU1 NOT DETECTED NOT DETECTED Final   Coronavirus NL63 NOT DETECTED NOT DETECTED Final   Coronavirus OC43 NOT DETECTED NOT DETECTED Final   Metapneumovirus NOT DETECTED NOT DETECTED Final   Rhinovirus / Enterovirus NOT DETECTED NOT DETECTED Final   Influenza A NOT DETECTED NOT DETECTED Final   Influenza B NOT DETECTED NOT DETECTED Final   Parainfluenza Virus 1 NOT DETECTED NOT DETECTED Final   Parainfluenza Virus 2 NOT DETECTED NOT DETECTED Final   Parainfluenza Virus 3 NOT DETECTED NOT DETECTED Final   Parainfluenza Virus 4 NOT DETECTED NOT DETECTED Final   Respiratory Syncytial Virus NOT DETECTED NOT DETECTED Final   Bordetella pertussis NOT DETECTED NOT DETECTED Final   Chlamydophila pneumoniae NOT DETECTED NOT DETECTED  Final   Mycoplasma pneumoniae NOT DETECTED NOT DETECTED Final    Comment: Performed at [facility], [place] [address], [place], [place] [address]  Rapid strep screen     Status: None   Collection Time: 08/09/17  5:39 PM  Result Value Ref Range Status   Streptococcus, Group A Screen (Direct) NEGATIVE NEGATIVE Final    Comment: (NOTE) A Rapid Antigen test may result negative if the antigen level in the sample is below the detection level of this test. The FDA has not cleared this test as a stand-alone test therefore the rapid antigen negative result has reflexed to a Group A Strep culture. Performed at [facility], [place] [address] [place], [place] [address]   Culture, group A strep     Status: None   Collection Time: 08/09/17  5:39 PM  Result Value Ref Range Status   Specimen Description   Final    THROAT Performed at [facility] [address], [place], [place] [address]    Special Requests   Final    NONE Reflexed from [id number] Performed at [facility], [place] [address], [place], [place] [address]    Culture   Final    NO GROUP A STREP (S.PYOGENES) ISOLATED Performed at [facility], [place] [address], [place], [place] [address]    Report Status 08/12/2017 FINAL  Final  MRSA PCR Screening     Status: None   Collection Time: 08/09/17  8:16 PM  Result Value Ref Range Status   MRSA by PCR NEGATIVE NEGATIVE Final    Comment:        The GeneXpert MRSA Assay (FDA approved for NASAL specimens only), is one component of a comprehensive MRSA colonization surveillance program. It is not intended to diagnose MRSA infection nor to guide or monitor treatment for MRSA infections. Performed at [facility], [place] [address], [place], [place] [address]   Culture, blood (routine x 2)     Status: None (Preliminary result)   Collection Time: 08/09/17  9:29 PM  Result Value Ref Range Status   Specimen  Description   Final    BLOOD RIGHT ARM Performed at [facility] [address], [place], [place] [address]    Special Requests   Final    Blood Culture adequate volume BOTTLES DRAWN AEROBIC AND ANAEROBIC Performed at [facility], [place] [address] [place], [place] [address]    Culture   Final    NO GROWTH 2 DAYS Performed at [facility] [address], [place], [place] [address]    Report Status PENDING  Incomplete  Culture, blood (routine x 2)     Status: None (Preliminary result)   Collection Time: 08/09/17 10:02 PM  Result Value Ref Range Status   Specimen Description   Final    BLOOD RIGHT ANTECUBITAL Performed at [facility] [address] [place], [place] [address]    Special Requests   Final    BOTTLES DRAWN AEROBIC AND ANAEROBIC Blood Culture adequate volume Performed at [facility] [address], [place], [place] [address]    Culture   Final    NO GROWTH 2 DAYS Performed at [facility] [address], [place], [place] [address]    Report Status PENDING  Incomplete     Radiology Studies: No results found.  Scheduled Meds: . heparin  5,000 Units Subcutaneous Q8H  . mouth rinse  15 mL Mouth Rinse BID  . metoprolol tartrate  12.5 mg Oral BID  . polyethylene glycol  17 g Oral Daily   Continuous Infusions: . sodium chloride 75 mL/hr at 08/12/17 1037     LOS: 3 days   Marylu Lund, MD [facility] Pager [phone]  If 7PM-7AM, please contact night-coverage [email or web address] Password [facility] 08/12/2017, 3:31 PM

## 2017-08-13 DIAGNOSIS — R4182 Altered mental status, unspecified: Secondary | ICD-10-CM

## 2017-08-13 DIAGNOSIS — F0390 Unspecified dementia without behavioral disturbance: Secondary | ICD-10-CM

## 2017-08-13 LAB — URINALYSIS, ROUTINE W REFLEX MICROSCOPIC
BILIRUBIN URINE: NEGATIVE
Glucose, UA: NEGATIVE mg/dL
HGB URINE DIPSTICK: NEGATIVE
KETONES UR: NEGATIVE mg/dL
Leukocytes, UA: NEGATIVE
Nitrite: NEGATIVE
PROTEIN: NEGATIVE mg/dL
Specific Gravity, Urine: 1.016 (ref 1.005–1.030)
pH: 5 (ref 5.0–8.0)

## 2017-08-13 LAB — BASIC METABOLIC PANEL
Anion gap: 10 (ref 5–15)
BUN: 25 mg/dL — AB (ref 6–20)
CALCIUM: 8.4 mg/dL — AB (ref 8.9–10.3)
CO2: 21 mmol/L — ABNORMAL LOW (ref 22–32)
Chloride: 108 mmol/L (ref 101–111)
Creatinine, Ser: 0.92 mg/dL (ref 0.61–1.24)
GFR calc Af Amer: >60 mL/min (ref >60)
GLUCOSE: 104 mg/dL — AB (ref 65–99)
Potassium: 3.6 mmol/L (ref 3.5–5.1)
Sodium: 139 mmol/L (ref 135–145)

## 2017-08-13 MED ORDER — CEFAZOLIN SODIUM-DEXTROSE 1-4 GM/50ML-% IV SOLN
1.0000 g | Freq: Three times a day (TID) | INTRAVENOUS | Status: DC
Start: 2017-08-13 — End: 2017-08-14
  Administered 2017-08-13 – 2017-08-14 (×4): 1 g via INTRAVENOUS
  Filled 2017-08-13 (×5): qty 50

## 2017-08-13 MED ORDER — TAMSULOSIN HCL 0.4 MG PO CAPS
0.4000 mg | ORAL_CAPSULE | Freq: Every day | ORAL | Status: DC
  Administered 2017-08-13 – 2017-08-14 (×2): 0.4 mg via ORAL
  Filled 2017-08-13 (×2): qty 1

## 2017-08-13 NOTE — Progress Notes (Signed)
PROGRESS NOTE [facility]   [name]   ACZ:[id number] DOB: 11-10-1935  DOA: 08/09/2017 PCP: Marletta Lor, MD   Brief Narrative:  Jonathon [name] is a 82 year old male with medical history significant for asthma, chronic diastolic CHF, chronic inflammatory demyelinating polyneuropathy, hypertension who presented to the ED on 3/29 with new changes on mental status and fever.  Patient was admitted with concerns of possible serotonin syndrome.  Subjective: Patient seen and examined, he is oriented to self and place only.  Report pain but unable to localize.  Denies difficulty breathing.  Assessment & Plan: Toxic metabolic encephalopathy Suspect related to serotonin syndrome, no infectious process Initially identified.  Viral respiratory panel negative, neurology was consulted recommended Ativan 2 mg every 6 hours, this has been taper off.  No further recommendations Psychiatry was consulted and recommended to do not restart Lexapro, trazodone or Zyprexa Melatonin was started nightly Slowly improving. Out of bed as tolerated  Fever Initially felt to be secondary to serotonin syndrome Now with erythema on left upper extremity, questionable IV infiltrate although looks like cellulitis.  Patient has been started on antibiotic Initial UA unremarkable, chest x-ray clear, blood cultures negative  Right upper extremity cellulitis Start cefazolin Continue to monitor  Hypertension She was treated with IV ACEI, Cardene ggt and IV beta-blocker BP has improved.  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 Patient with history of BPH, resume Flomax Remove Foley catheter  and obtain  UA and urine culture given fever Holding ACEI Repeat BMP in a.m.  DVT prophylaxis: Heparin SQ Code Status: Full code Family Communication: None at bedside Disposition Plan: SNF in 2-3 days if mental status continues to improve and afebrile  Consultants:   Neurology  Psychiatry  Procedures:     Antimicrobials: Anti-infectives (From admission, onward)   Start     Dose/Rate Route Frequency Ordered Stop   08/13/17 1200  ceFAZolin (ANCEF) IVPB 1 g/50 mL premix     1 g 100 mL/hr over 30 Minutes Intravenous Every 8 hours 08/13/17 1119     08/09/17 1515  vancomycin (VANCOCIN) IVPB 1000 mg/200 mL premix     1,000 mg 200 mL/hr over 60 Minutes Intravenous  Once 08/09/17 1503 08/09/17 1837   08/09/17 1515  piperacillin-tazobactam (ZOSYN) IVPB 3.375 g     3.375 g 100 mL/hr over 30 Minutes Intravenous  Once 08/09/17 1503 08/09/17 1718         Objective: Vitals:   08/13/17 0257 08/13/17 0445 08/13/17 0530 08/13/17 1327  BP:  (!) 173/86 139/80 (!) 162/79  Pulse:  100 (!) 105 98  Resp:  20  (!) 32  Temp:  98.7 F (37.1 C)  (!) 101.7 F (38.7 C)  TempSrc:  Oral  Oral  SpO2:  97%  98%  Weight: 79.6 kg (175 lb 7.8 oz)     Height:        Intake/Output Summary (Last 24 hours) at 08/13/2017 1659 Last data filed at 08/13/2017 1403 Gross per 24 hour  Intake 1010 ml  Output 1700 ml  Net -690 ml   Filed Weights   08/11/17 0354 08/12/17 1700 08/13/17 0257  Weight: 78.1 kg (  172 lb 2.9 oz) 83.6 kg (184 lb 4.9 oz) 79.6 kg (175 lb 7.8 oz)    Examination:  General exam: Appears calm and comfortable  Respiratory system: Clear to auscultation. No wheezes,crackle or rhonchi Cardiovascular system: S1 & S2 heard, RRR. No JVD, murmurs, rubs or gallops Gastrointestinal system: Abdomen is nondistended, soft and nontender.  Central nervous system: Oriented to place and person only, follows commands.  No focal Extremities: No pedal edema. Symmetric, strength 5/5   Skin: Left upper  extremity with erythema in the antecubital fossa and forearm, mild induration tender to palpation and warm   Data Reviewed: I have personally reviewed following labs and imaging studies  CBC: Recent Labs  Lab 08/08/17 1917 08/09/17 1234 08/10/17 0331 08/12/17 0339  WBC 7.2 7.9 8.0 9.0  NEUTROABS  --  5.2  --   --   HGB 12.6* 11.3* 13.0 10.4*  HCT 36.9* 34.8* 40.4 32.3*  MCV 82.7 83.3 84.2 83.2  PLT 229 254 259 242   Basic Metabolic Panel: Recent Labs  Lab 08/09/17 1234 08/10/17 0331 08/11/17 0336 08/12/17 0339 08/13/17 0523  NA 141 142 143 141 139  K 4.7 4.2 4.0 3.6 3.6  CL 107 107 110 110 108  CO2 22 21* 20* 21* 21*  GLUCOSE 82 84 127* 128* 104*  BUN 26* 23* 30* 45* 25*  CREATININE 1.07 0.94 1.06 1.74* 0.92  CALCIUM 8.9 9.0 8.9 8.5* 8.4*   GFR: Estimated Creatinine Clearance: 67.2 mL/min (by C-G formula based on SCr of 0.92 mg/dL). Liver Function Tests: Recent Labs  Lab 08/08/17 1917 08/09/17 1234 08/10/17 0331  AST 90* 55* 50*  ALT 36 28 29  ALKPHOS 76 63 68  BILITOT 1.3* 1.2 1.0  PROT 8.1 7.2 7.8  ALBUMIN 3.3* 3.1* 3.3*   Recent Labs  Lab 08/09/17 1234  LIPASE 24   Recent Labs  Lab 08/08/17 2023  AMMONIA 14   Coagulation Profile: No results for input(s): INR, PROTIME in the last 168 hours. Cardiac Enzymes: Recent Labs  Lab 08/09/17 1234 08/10/17 2102  CKTOTAL 644* 308   BNP (last 3 results) Recent Labs    01/06/17 1705  PROBNP 532.0*   HbA1C: No results for input(s): HGBA1C in the last 72 hours. CBG: Recent Labs  Lab 08/08/17 1858  GLUCAP 66   Lipid Profile: No results for input(s): CHOL, HDL, LDLCALC, TRIG, CHOLHDL, LDLDIRECT in the last 72 hours. Thyroid Function Tests: No results for input(s): TSH, T4TOTAL, FREET4, T3FREE, THYROIDAB in the last 72 hours. Anemia Panel: No results for input(s): VITAMINB12, FOLATE, FERRITIN, TIBC, IRON, RETICCTPCT in the last 72 hours. Sepsis Labs: Recent Labs  Lab 08/09/17 1241    LATICACIDVEN 1.43    Recent Results (from the past 240 hour(s))  Urine culture     Status: None   Collection Time: 08/08/17  9:34 PM  Result Value Ref Range Status   Specimen Description URINE, CATHETERIZED  Final   Special Requests NONE  Final   Culture   Final    NO GROWTH Performed at [facility], [place] [address] [place], [place] [address]    Report Status 08/10/2017 FINAL  Final  Respiratory Panel by PCR     Status: None   Collection Time: 08/09/17 12:34 PM  Result Value Ref Range Status   Adenovirus NOT DETECTED NOT DETECTED Final   Coronavirus 229E NOT DETECTED NOT DETECTED Final   Coronavirus HKU1 NOT DETECTED NOT DETECTED Final   Coronavirus NL63 NOT DETECTED NOT  DETECTED Final   Coronavirus OC43 NOT DETECTED NOT DETECTED Final   Metapneumovirus NOT DETECTED NOT DETECTED Final   Rhinovirus / Enterovirus NOT DETECTED NOT DETECTED Final   Influenza A NOT DETECTED NOT DETECTED Final   Influenza B NOT DETECTED NOT DETECTED Final   Parainfluenza Virus 1 NOT DETECTED NOT DETECTED Final   Parainfluenza Virus 2 NOT DETECTED NOT DETECTED Final   Parainfluenza Virus 3 NOT DETECTED NOT DETECTED Final   Parainfluenza Virus 4 NOT DETECTED NOT DETECTED Final   Respiratory Syncytial Virus NOT DETECTED NOT DETECTED Final   Bordetella pertussis NOT DETECTED NOT DETECTED Final   Chlamydophila pneumoniae NOT DETECTED NOT DETECTED Final   Mycoplasma pneumoniae NOT DETECTED NOT DETECTED Final    Comment: Performed at [facility], [place] [address] [place], [place] [address]  Rapid strep screen     Status: None   Collection Time: 08/09/17  5:39 PM  Result Value Ref Range Status   Streptococcus, Group A Screen (Direct) NEGATIVE NEGATIVE Final    Comment: (NOTE) A Rapid Antigen test may result negative if the antigen level in the sample is below the detection level of this test. The FDA has not cleared this test as a stand-alone test therefore the rapid antigen negative  result has reflexed to a Group A Strep culture. Performed at [facility], [place] [address] [place], [place] [address]   Culture, group A strep     Status: None   Collection Time: 08/09/17  5:39 PM  Result Value Ref Range Status   Specimen Description   Final    THROAT Performed at [facility] [address] [place], [place] [address]    Special Requests   Final    NONE Reflexed from [id number] Performed at [facility], [place] [address] [place], [place] [address]    Culture   Final    NO GROUP A STREP (S.PYOGENES) ISOLATED Performed at [facility], [place] [address] [place], [place] [address]    Report Status 08/12/2017 FINAL  Final  MRSA PCR Screening     Status: None   Collection Time: 08/09/17  8:16 PM  Result Value Ref Range Status   MRSA by PCR NEGATIVE NEGATIVE Final    Comment:        The GeneXpert MRSA Assay (FDA approved for NASAL specimens only), is one component of a comprehensive MRSA colonization surveillance program. It is not intended to diagnose MRSA infection nor to guide or monitor treatment for MRSA infections. Performed at [facility], [place] [address], [place], [place] [address]   Culture, blood (routine x 2)     Status: None (Preliminary result)   Collection Time: 08/09/17  9:29 PM  Result Value Ref Range Status   Specimen Description   Final    BLOOD RIGHT ARM Performed at [facility] [address] [place], [place] [address]    Special Requests   Final    Blood Culture adequate volume BOTTLES DRAWN AEROBIC AND ANAEROBIC Performed at [facility] [address], [place], [place] [address]    Culture   Final    NO GROWTH 3 DAYS Performed at [facility], [place] [address], [place], [place] [address]    Report Status PENDING  Incomplete  Culture, blood (routine x 2)     Status: None (Preliminary result)   Collection Time:  08/09/17 10:02 PM  Result Value Ref Range  Status   Specimen Description   Final    BLOOD RIGHT ANTECUBITAL Performed at [facility] [address], [place], [place] [address]    Special Requests   Final    BOTTLES DRAWN AEROBIC AND ANAEROBIC Blood Culture adequate volume Performed at [facility] [address], [place], [place] [address]    Culture   Final    NO GROWTH 3 DAYS Performed at [facility], [place] [address], [place], [place] [address]    Report Status PENDING  Incomplete      Radiology Studies: No results found.    Scheduled Meds: . heparin  5,000 Units Subcutaneous Q8H  . mouth rinse  15 mL Mouth Rinse BID  . Melatonin  3 mg Oral QHS  . metoprolol tartrate  12.5 mg Oral BID  . polyethylene glycol  17 g Oral Daily  . tamsulosin  0.4 mg Oral Daily   Continuous Infusions: .  ceFAZolin (ANCEF) IV Stopped (08/13/17 1236)     LOS: 4 days    Time spent: Total of 25 minutes spent with pt, greater than 50% of which was spent in discussion of  treatment, counseling and coordination of care   Chipper Oman, MD Pager: Text Page via [email or web address]   If 7PM-7AM, please contact night-coverage [email or web address] 08/13/2017, 4:59 PM   Note - This record has been created using [employer]. Chart creation errors have been sought, but may not always have been located. Such creation errors do not reflect on the standard of medical care.

## 2017-08-13 NOTE — Telephone Encounter (Signed)
left message for Jonathon [name] to return call.

## 2017-08-13 NOTE — Progress Notes (Signed)
LCSW following for SNF placement.   Patient needs bed offers. Daughter prefers [name].  LCSW will continue to follow.   Carolin Coy Kimberly [place]

## 2017-08-13 NOTE — NC FL2 (Signed)
[place] LEVEL OF CARE SCREENING TOOL     IDENTIFICATION  Patient Name: Jonathon [name] Birthdate: [birth year] Sex: male Admission Date (Current Location): 08/09/2017  [facility] and [place] Number:  Herbalist and Address:  [facility],  [place], [place]      Provider Number: [id number]  Attending Physician Name and Address:  Patrecia Pour, Christean Grief, MD  Relative Name and Phone Number:       Current Level of Care: Hospital Recommended Level of Care: [place] Prior Approval Number: [id number]  Date Approved/Denied:   PASRR Number:    Discharge Plan: SNF    Current Diagnoses: Patient Active Problem List   Diagnosis Date Noted  . Altered mental status   . Metabolic encephalopathy 45/80/9983  . Dementia 08/04/2017  . Seizure ([place]) 08/01/2017  . Thiamine deficiency 05/27/2017  . B12 deficiency 04/30/2017  . Alcoholism ([place]) 01/28/2017  . Acute encephalopathy 12/28/2016  . Hepatic steatosis 06/05/2016  . Physical deconditioning 06/05/2016  . Chronic diastolic congestive heart failure ([place]) 06/05/2016  . Orthostatic syncope   . Elevated liver function tests   . LOC (loss of consciousness) ([place]) 08/10/2014  . Syncope 08/10/2014  . CIDP (chronic inflammatory demyelinating polyneuropathy) ([place]) 08/08/2014  . Sustained SVT ([place]) 07/27/2014  . 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 Abnormal LFTs 08/06/2013  . Iron deficiency anemia 04/19/2013  . Muscle weakness 01/13/2013  . External hemorrhoids 03/23/2011  . Symptomatic bradycardia 10/25/2008  . 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 BENIGN PROSTATIC HYPERTROPHY, HX OF 10/25/2008  . PACEMAKER, PERMANENT 10/25/2008  . Essential hypertension 11/21/2006  . BPH (benign prostatic hyperplasia) 11/21/2006  . History of colonic polyps 11/21/2006  . NEPHROLITHIASIS, HX OF 11/21/2006    Orientation RESPIRATION BLADDER Height & Weight     Self, Time, Place  Normal  Incontinent Weight: 175 lb 7.8 oz (79.6 kg) Height:  5\' 10"  (177.8 cm)  BEHAVIORAL SYMPTOMS/MOOD NEUROLOGICAL BOWEL NUTRITION STATUS      Continent Diet(See dc summary)  AMBULATORY STATUS COMMUNICATION OF NEEDS Skin   Extensive Assist   Normal                       Personal Care Assistance Level of Assistance  Bathing, Feeding, Dressing Bathing Assistance: Limited assistance Feeding assistance: Independent Dressing Assistance: Limited assistance     Functional Limitations Info  Sight, Hearing, Speech Sight Info: Adequate Hearing Info: Adequate Speech Info: Adequate    SPECIAL CARE FACTORS FREQUENCY  OT (By licensed OT), PT (By licensed PT)     PT Frequency: 5x/week OT Frequency: 5x/week            Contractures Contractures Info: Not present    Additional Factors Info  Code Status, Allergies Code Status Info: Full Allergies Info: NKA           Current Medications (08/13/2017):  This is the current hospital active medication list Current Facility-Administered Medications  Medication Dose Route Frequency Provider Last Rate Last Dose  . acetaminophen (TYLENOL) tablet 650 mg  650 mg Oral Q6H PRN Donne Hazel, MD   650 mg at 08/13/17 1350   Or  . acetaminophen (TYLENOL) suppository 650 mg  650 mg Rectal Q6H PRN Donne Hazel, MD      . ceFAZolin (ANCEF) IVPB 1 g/50 mL premix  1 g Intravenous Q8H Patrecia Pour, Christean Grief, MD   Stopped at 08/13/17 1236  . heparin injection 5,000 Units  5,000 Units Subcutaneous Q8H  Donne Hazel, MD   5,000 Units at 08/13/17 1350  . hydrALAZINE (APRESOLINE) injection 10 mg  10 mg Intravenous Q4H PRN Donne Hazel, MD   10 mg at 08/13/17 1350  . labetalol (NORMODYNE,TRANDATE) injection 5 mg  5 mg Intravenous Q2H PRN Donne Hazel, MD   5 mg at 08/10/17 1602  . MEDLINE mouth rinse  15 mL Mouth Rinse BID Donne Hazel, MD   15 mL at 08/13/17 1205  . Melatonin TABS 3 mg  3 mg Oral QHS Donne Hazel, MD   3 mg at 08/12/17 2159  .  metoprolol tartrate (LOPRESSOR) tablet 12.5 mg  12.5 mg Oral BID Donne Hazel, MD   12.5 mg at 08/13/17 1204  . polyethylene glycol (MIRALAX / GLYCOLAX) packet 17 g  17 g Oral Daily Donne Hazel, MD   17 g at 08/13/17 1205  . tamsulosin (FLOMAX) capsule 0.4 mg  0.4 mg Oral Daily Patrecia Pour, Christean Grief, MD   0.4 mg at 08/13/17 1204     Discharge Medications: Please see discharge summary for a list of discharge medications.  Relevant Imaging Results:  Relevant Lab Results:   Additional Information [id number]  Servando Snare, LCSW

## 2017-08-13 NOTE — Evaluation (Addendum)
Occupational Therapy Evaluation Patient Details Name: Jonathon [name] MRN: [id number] DOB: 31-Oct-1935 Today's Date: 08/13/2017    History of Present Illness 82 yo male admitted with AMS and toxic metabolic encephalopathy. Hx of asthma, CHF, HTnm chronic inflammatory demyelinating polyneuropathy, pacemaker, dementia, HTN.    Clinical Impression   Pt admitted with altered AMS. Pt currently with functional limitations due to the deficits listed below (see OT Problem List). Pt will benefit from skilled OT to increase their safety and independence with ADL and functional mobility for ADL to facilitate discharge to venue listed below.      Follow Up Recommendations  SNF   Equipment Recommendations  None recommended by OT    Recommendations for Other Services       Precautions / Restrictions Precautions Precautions: Fall Restrictions Weight Bearing Restrictions: No      Mobility Bed Mobility Overal bed mobility: Needs Assistance Bed Mobility: Supine to Sit     Supine to sit: Max assist;+2 for physical assistance;+2 for safety/equipment;HOB elevated     General bed mobility comments: Assist for trunk and bil LEs. Utilized bedpad. Multimodal cueing required. Jerky movements noted at times  Transfers Overall transfer level: Needs assistance Equipment used: Rolling walker (2 wheeled);2 person hand held assist Transfers: Sit to/from Stand Sit to Stand: Mod assist;+2 safety/equipment;+2 physical assistance;From elevated surface         General transfer comment: x2. Assist to rise, stabilize, control descent. Multimodal cueing required. Jerky movements noted at times.     Balance Overall balance assessment: Needs assistance         Standing balance support: Bilateral upper extremity supported Standing balance-Leahy Scale: Poor                             ADL either performed or assessed with clinical judgement   ADL Overall ADL's : Needs  assistance/impaired Eating/Feeding: Sitting;Moderate assistance   Grooming: Sitting;Moderate assistance   Upper Body Bathing: Maximal assistance;Sitting   Lower Body Bathing: Sit to/from stand;+2 for physical assistance;+2 for safety/equipment;Total assistance   Upper Body Dressing : Moderate assistance;Sitting   Lower Body Dressing: +2 for physical assistance;+2 for safety/equipment;Sit to/from stand;Cueing for safety;Cueing for sequencing;Total assistance                 General ADL Comments: Pt needs MUCH more A this OT visit than when he was seen last week by OT.  Pt will likely need SNF     Vision Patient Visual Report: No change from baseline              Pertinent Vitals/Pain Pain Assessment: Faces Faces Pain Scale: No hurt     Hand Dominance     Extremity/Trunk Assessment BUE with noted edema in hands, fingers and lower arms.  Noted redness and warmth on LUE lower arm - RN/MD notified   Lower Extremity Assessment Lower Extremity Assessment: Generalized weakness   Cervical / Trunk Assessment Cervical / Trunk Assessment: Normal   Communication Communication Communication: Expressive difficulties;Receptive difficulties   Cognition Arousal/Alertness: Awake/alert Behavior During Therapy: WFL for tasks assessed/performed Overall Cognitive Status: No family/caregiver present to determine baseline cognitive functioning                                 General Comments: inconsistent accuracy of yes/no responses. A&Ox1.    General Comments  Home Living Family/patient expects to be discharged to:: Skilled nursing facility     Type of Home: Assisted living                                  Prior Functioning/Environment          Comments: amb with cane prior to admission in 07/2017        OT Problem List: Impaired balance (sitting and/or standing);Decreased strength;Decreased knowledge of use of DME or  AE;Decreased activity tolerance      OT Treatment/Interventions: Self-care/ADL training;Energy conservation;Balance training;Patient/family education;Therapeutic activities    OT Goals(Current goals can be found in the care plan section) Acute Rehab OT Goals Patient Stated Goal: none stated; agreeable to therapy OT Goal Formulation: With patient  OT Frequency: Min 2X/week   Barriers to D/C:            Co-evaluation   Reason for Co-Treatment: For patient/therapist safety          AM-PAC PT "6 Clicks" Daily Activity     Outcome Measure Help from another person eating meals?: None Help from another person taking care of personal grooming?: A Little Help from another person toileting, which includes using toliet, bedpan, or urinal?: A Little Help from another person bathing (including washing, rinsing, drying)?: A Little Help from another person to put on and taking off regular upper body clothing?: A Little Help from another person to put on and taking off regular lower body clothing?: A Little 6 Click Score: 19   End of Session Equipment Utilized During Treatment: Rolling walker Nurse Communication: Mobility status  Activity Tolerance: Patient tolerated treatment well Patient left: with call bell/phone within reach;in bed;with bed alarm set;with nursing/sitter in room  OT Visit Diagnosis: Muscle weakness (generalized) (M62.81);Unsteadiness on feet (R26.81)                Time: 1660-6004 OT Time Calculation (min): 35 min Charges:  OT General Charges $OT Visit: 1 Visit OT Evaluation $OT Eval Moderate Complexity: 1 Mod G-Codes:     Kari Baars, [place]  [name] 08/13/2017, 12:39 PM

## 2017-08-13 NOTE — Progress Notes (Signed)
Removed foley catheter to attempt voiding trail and patient restarted on Flomax. Foley removed at 1225. Bladder scan at 1730 showed 500cc of urine in the bladder. Patient had not voided any. Patient unable to void on his own. MD made aware. Orders received to replace foley catheter for retention and to send a UA and Urine Cx. 16Fr foley catheter placed without and difficulties. Urine samples collected and sent to the lab.

## 2017-08-13 NOTE — Care Management Important Message (Signed)
Important Message  Patient Details  Name: Jonathon [name] MRN: [id number] Date of Birth: 1936-03-16   Medicare Important Message Given:  Yes    Kerin Salen 08/13/2017, 9:58 AMImportant Message  Patient Details  Name: Jonathon [name] MRN: [id number] Date of Birth: 1936-01-01   Medicare Important Message Given:  Yes    Kerin Salen 08/13/2017, 9:58 AM

## 2017-08-13 NOTE — Evaluation (Signed)
Physical Therapy Evaluation Patient Details Name: Jonathon [name] MRN: [id number] DOB: 05-28-1935 Today's Date: 08/13/2017   History of Present Illness  82 yo male admitted with AMS and toxic metabolic encephalopathy. Hx of asthma, CHF, HTnm chronic inflammatory demyelinating polyneuropathy, pacemaker, dementia, HTN.     Clinical Impression  On eval, pt required Max assist +2 for mobility. He stood x 2 (once with 2 HHA, once with a RW) for ~20-30 seconds at bedside. Pt presents with general weakness, decreased activity tolerance, and impaired gait and balance. No family present during session. Pt was A&Ox 1. Receptive and expressive difficulties noted during session. Unsure of PLOF at ALF. At this time, recommendation is for SNF. Will follow and progress activity as tolerated.     Follow Up Recommendations SNF    Equipment Recommendations  None recommended by PT    Recommendations for Other Services       Precautions / Restrictions Precautions Precautions: Fall Restrictions Weight Bearing Restrictions: No      Mobility  Bed Mobility Overal bed mobility: Needs Assistance Bed Mobility: Supine to Sit     Supine to sit: Max assist;+2 for physical assistance;+2 for safety/equipment;HOB elevated     General bed mobility comments: Assist for trunk and bil LEs. Utilized bedpad. Multimodal cueing required. Jerky movements noted at times  Transfers Overall transfer level: Needs assistance Equipment used: Rolling walker (2 wheeled);2 person hand held assist Transfers: Sit to/from Stand Sit to Stand: Mod assist;+2 safety/equipment;+2 physical assistance;From elevated surface         General transfer comment: x2. Assist to rise, stabilize, control descent. Multimodal cueing required. Jerky movements noted at times.   Ambulation/Gait             General Gait Details: NT  Stairs            Wheelchair Mobility    Modified Rankin (Stroke Patients Only)       Balance  Overall balance assessment: Needs assistance         Standing balance support: Bilateral upper extremity supported Standing balance-Leahy Scale: Poor                               Pertinent Vitals/Pain Pain Assessment: Faces Faces Pain Scale: No hurt    Home Living Family/patient expects to be discharged to:: Skilled nursing facility     Type of Home: Assisted living                Prior Function           Comments: amb with cane prior to admission in 07/2017     Hand Dominance        Extremity/Trunk Assessment   Upper Extremity Assessment Upper Extremity Assessment: Defer to OT evaluation    Lower Extremity Assessment Lower Extremity Assessment: Generalized weakness    Cervical / Trunk Assessment Cervical / Trunk Assessment: Normal  Communication   Communication: Expressive difficulties;Receptive difficulties  Cognition Arousal/Alertness: Awake/alert Behavior During Therapy: WFL for tasks assessed/performed Overall Cognitive Status: No family/caregiver present to determine baseline cognitive functioning                                 General Comments: inconsistent accuracy of yes/no responses. A&Ox1.       General Comments      Exercises     Assessment/Plan    PT Assessment  Patient needs continued PT services  PT Problem List Decreased strength;Decreased mobility;Decreased cognition;Decreased activity tolerance;Decreased balance;Decreased knowledge of use of DME;Decreased range of motion       PT Treatment Interventions DME instruction;Gait training;Therapeutic activities;Therapeutic exercise;Patient/family education;Balance training;Functional mobility training    PT Goals (Current goals can be found in the Care Plan section)  Acute Rehab PT Goals Patient Stated Goal: none stated; agreeable to therapy PT Goal Formulation: With patient Time For Goal Achievement: 08/27/17 Potential to Achieve Goals: Fair     Frequency Min 3X/week   Barriers to discharge        Co-evaluation PT/OT/SLP Co-Evaluation/Treatment: Yes Reason for Co-Treatment: For patient/therapist safety           AM-PAC PT "6 Clicks" Daily Activity  Outcome Measure Difficulty turning over in bed (including adjusting bedclothes, sheets and blankets)?: Unable Difficulty moving from lying on back to sitting on the side of the bed? : Unable Difficulty sitting down on and standing up from a chair with arms (e.g., wheelchair, bedside commode, etc,.)?: Unable Help needed moving to and from a bed to chair (including a wheelchair)?: Total Help needed walking in hospital room?: Total Help needed climbing 3-5 steps with a railing? : Total 6 Click Score: 6    End of Session Equipment Utilized During Treatment: Gait belt Activity Tolerance: Patient tolerated treatment well Patient left: in bed;with call bell/phone within reach;with bed alarm set   PT Visit Diagnosis: Muscle weakness (generalized) (M62.81);Difficulty in walking, not elsewhere classified (R26.2);Other abnormalities of gait and mobility (R26.89)    Time: 9323-5573 PT Time Calculation (min) (ACUTE ONLY): 32 min   Charges:   PT Evaluation $PT Eval Moderate Complexity: 1 Mod     PT G Codes:          [name] Anna, MPT Pager: [phone]

## 2017-08-14 ENCOUNTER — Inpatient Hospital Stay (HOSPITAL_COMMUNITY): Payer: Medicare Other

## 2017-08-14 DIAGNOSIS — M7989 Other specified soft tissue disorders: Secondary | ICD-10-CM

## 2017-08-14 LAB — CBC WITH DIFFERENTIAL/PLATELET
BASOS ABS: 0 10*3/uL (ref 0.0–0.1)
BASOS PCT: 0 %
EOS ABS: 0.1 10*3/uL (ref 0.0–0.7)
EOS PCT: 1 %
HCT: 28.3 % — ABNORMAL LOW (ref 39.0–52.0)
HEMOGLOBIN: 9.4 g/dL — AB (ref 13.0–17.0)
LYMPHS ABS: 1.4 10*3/uL (ref 0.7–4.0)
Lymphocytes Relative: 18 %
MCH: 27.1 pg (ref 26.0–34.0)
MCHC: 33.2 g/dL (ref 30.0–36.0)
MCV: 81.6 fL (ref 78.0–100.0)
Monocytes Absolute: 0.9 10*3/uL (ref 0.1–1.0)
Monocytes Relative: 12 %
NEUTROS PCT: 69 %
Neutro Abs: 5.3 10*3/uL (ref 1.7–7.7)
PLATELETS: 226 10*3/uL (ref 150–400)
RBC: 3.47 MIL/uL — AB (ref 4.22–5.81)
RDW: 14.8 % (ref 11.5–15.5)
WBC: 7.7 10*3/uL (ref 4.0–10.5)

## 2017-08-14 LAB — BASIC METABOLIC PANEL
Anion gap: 8 (ref 5–15)
BUN: 18 mg/dL (ref 6–20)
CALCIUM: 8.3 mg/dL — AB (ref 8.9–10.3)
CHLORIDE: 108 mmol/L (ref 101–111)
CO2: 23 mmol/L (ref 22–32)
CREATININE: 0.91 mg/dL (ref 0.61–1.24)
Glucose, Bld: 92 mg/dL (ref 65–99)
Potassium: 3.6 mmol/L (ref 3.5–5.1)
SODIUM: 139 mmol/L (ref 135–145)

## 2017-08-14 MED ORDER — TAMSULOSIN HCL 0.4 MG PO CAPS
0.8000 mg | ORAL_CAPSULE | Freq: Every day | ORAL | Status: DC
  Administered 2017-08-15 – 2017-08-16 (×2): 0.8 mg via ORAL
  Filled 2017-08-14 (×2): qty 2

## 2017-08-14 MED ORDER — IRBESARTAN 300 MG PO TABS
300.0000 mg | ORAL_TABLET | Freq: Every day | ORAL | Status: DC
  Administered 2017-08-14 – 2017-08-16 (×3): 300 mg via ORAL
  Filled 2017-08-14 (×3): qty 1

## 2017-08-14 MED ORDER — FLECAINIDE ACETATE 50 MG PO TABS
75.0000 mg | ORAL_TABLET | Freq: Two times a day (BID) | ORAL | Status: DC
  Administered 2017-08-14 – 2017-08-16 (×5): 75 mg via ORAL
  Filled 2017-08-14 (×7): qty 2

## 2017-08-14 MED ORDER — METOPROLOL TARTRATE 25 MG PO TABS
25.0000 mg | ORAL_TABLET | Freq: Two times a day (BID) | ORAL | Status: DC
  Administered 2017-08-14 – 2017-08-16 (×4): 25 mg via ORAL
  Filled 2017-08-14 (×4): qty 1

## 2017-08-14 MED ORDER — ASPIRIN EC 81 MG PO TBEC
81.0000 mg | DELAYED_RELEASE_TABLET | Freq: Every day | ORAL | Status: DC
  Administered 2017-08-14 – 2017-08-16 (×3): 81 mg via ORAL
  Filled 2017-08-14 (×3): qty 1

## 2017-08-14 NOTE — Progress Notes (Signed)
PROGRESS NOTE [facility]   Jonathon [name]   NWG:[id number] DOB: 1935-12-05  DOA: 08/09/2017 PCP: Marletta Lor, MD   Brief Narrative:  Jonathon [name] is a 82 year old male with medical history significant for asthma, chronic diastolic CHF, chronic inflammatory demyelinating polyneuropathy, hypertension who presented to the ED on 3/29 with new changes on mental status and fever.  Patient was admitted with concerns of possible serotonin syndrome.  Subjective: She is seen and examined, he is more awake and alert and talkative.  Denies difficulty breathing and shortness of breath. + Left arm pain and swelling.  Assessment & Plan: Toxic metabolic encephalopathy - slowly improving Suspect related to serotonin syndrome, no infectious process initially identified.  Viral respiratory panel negative, neurology was consulted recommended Ativan 2 mg every 6 hours, this has been taper off.  No further recommendations.  Psychiatry was consulted and recommended to do not restart Lexapro, trazodone or Zyprexa. Melatonin was started nightly. Out of bed as tolerated.   Fever Fever now have defervescence, a febrile for over 24 hours Initially felt to be secondary to serotonin syndrome, then thought of cellulitis, this has been r/o  Initial UA unremarkable, chest x-ray clear, blood cultures negative  R upper extremity swelling due to superficial vein thrombosis  Initial thought was cellulitis, was treated with antibiotic with no significant improvement.  Will discontinue cefazolin as  Doppler ultrasound shows superficial vein thrombosis.  Treat with were compresses and will add aspirin.  Keep arm elevated  Hypertension HE was treated with IV ACEI, Cardene ggt and IV beta-blocker BP improved but not at goal.  Will increase metoprolol 25 mg daily as heart rate is also slightly elevated.  Add home dose Avapro as renal function is back to baseline.  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 Monitor BMP in a.m.  Urinary retention Patient with history of BPH,  Failed voiding trial yesterday Foley was reinserted.  We will increase Flomax and do voiding trials in a.m.  DVT prophylaxis: Heparin SQ Code Status: Full code Family Communication: None at bedside Disposition Plan: SNF in 2-3 days if mental status continues to improve and afebrile  Consultants:   Neurology  Psychiatry  Procedures:     Antimicrobials: Anti-infectives (From admission, onward)   Start     Dose/Rate Route Frequency Ordered Stop   08/13/17 1200  ceFAZolin (ANCEF) IVPB 1 g/50 mL premix     1 g 100 mL/hr over 30 Minutes Intravenous Every 8 hours 08/13/17 1119     08/09/17 1515  vancomycin (VANCOCIN) IVPB 1000 mg/200 mL premix     1,000 mg 200 mL/hr over 60 Minutes Intravenous  Once 08/09/17 1503 08/09/17 1837   08/09/17 1515  piperacillin-tazobactam (ZOSYN) IVPB 3.375 g     3.375 g 100 mL/hr over 30 Minutes Intravenous  Once 08/09/17 1503 08/09/17 1718        Objective: Vitals:   08/14/17 0600 08/14/17 0730 08/14/17 0955 08/14/17 1304  BP:  (!) 159/78 (!) 150/61 (!) 146/92  Pulse:   (!) 102 (!) 113  Resp:    20  Temp:  97.8 F (36.6 C)  99.7 F (37.6 C)  TempSrc:  Oral  Axillary  SpO2:    99%  Weight: 81.4 kg (179 lb  7.3 oz)     Height:        Intake/Output Summary (Last 24 hours) at 08/14/2017 1701 Last data filed at 08/14/2017 0900 Gross per 24 hour  Intake 220 ml  Output 750 ml  Net -530 ml   Filed Weights   08/12/17 1700 08/13/17 0257 08/14/17 0600  Weight: 83.6 kg (184 lb 4.9 oz) 79.6 kg (175 lb 7.8 oz) 81.4 kg (179 lb 7.3 oz)    Examination:  General: Pt is alert, awake, not in acute distress Cardiovascular: RRR, S1/S2 +, no rubs, no  gallops Abdominal: Soft, NT, ND, bowel sounds + GU: Foley catheter in place Neuro: Pleasantly confused Extremities: Left upper extremity erythema, swelling nontender in the forearm area.  LE no edema  Data Reviewed: I have personally reviewed following labs and imaging studies  CBC: Recent Labs  Lab 08/08/17 1917 08/09/17 1234 08/10/17 0331 08/12/17 0339 08/14/17 0544  WBC 7.2 7.9 8.0 9.0 7.7  NEUTROABS  --  5.2  --   --  5.3  HGB 12.6* 11.3* 13.0 10.4* 9.4*  HCT 36.9* 34.8* 40.4 32.3* 28.3*  MCV 82.7 83.3 84.2 83.2 81.6  PLT 229 254 259 304 161   Basic Metabolic Panel: Recent Labs  Lab 08/10/17 0331 08/11/17 0336 08/12/17 0339 08/13/17 0523 08/14/17 0544  NA 142 143 141 139 139  K 4.2 4.0 3.6 3.6 3.6  CL 107 110 110 108 108  CO2 21* 20* 21* 21* 23  GLUCOSE 84 127* 128* 104* 92  BUN 23* 30* 45* 25* 18  CREATININE 0.94 1.06 1.74* 0.92 0.91  CALCIUM 9.0 8.9 8.5* 8.4* 8.3*   GFR: Estimated Creatinine Clearance: 68 mL/min (by C-G formula based on SCr of 0.91 mg/dL). Liver Function Tests: Recent Labs  Lab 08/08/17 1917 08/09/17 1234 08/10/17 0331  AST 90* 55* 50*  ALT 36 28 29  ALKPHOS 76 63 68  BILITOT 1.3* 1.2 1.0  PROT 8.1 7.2 7.8  ALBUMIN 3.3* 3.1* 3.3*   Recent Labs  Lab 08/09/17 1234  LIPASE 24   Recent Labs  Lab 08/08/17 2023  AMMONIA 14   Coagulation Profile: No results for input(s): INR, PROTIME in the last 168 hours. Cardiac Enzymes: Recent Labs  Lab 08/09/17 1234 08/10/17 2102  CKTOTAL 644* 308   BNP (last 3 results) Recent Labs    01/06/17 1705  PROBNP 532.0*   HbA1C: No results for input(s): HGBA1C in the last 72 hours. CBG: Recent Labs  Lab 08/08/17 1858  GLUCAP 66   Lipid Profile: No results for input(s): CHOL, HDL, LDLCALC, TRIG, CHOLHDL, LDLDIRECT in the last 72 hours. Thyroid Function Tests: No results for input(s): TSH, T4TOTAL, FREET4, T3FREE, THYROIDAB in the last 72 hours. Anemia Panel: No results for  input(s): VITAMINB12, FOLATE, FERRITIN, TIBC, IRON, RETICCTPCT in the last 72 hours. Sepsis Labs: Recent Labs  Lab 08/09/17 1241  LATICACIDVEN 1.43    Recent Results (from the past 240 hour(s))  Urine culture     Status: None   Collection Time: 08/08/17  9:34 PM  Result Value Ref Range Status   Specimen Description URINE, CATHETERIZED  Final   Special Requests NONE  Final   Culture   Final    NO GROWTH Performed at [facility], [place] [address], [place], [place] [address]    Report Status 08/10/2017 FINAL  Final  Respiratory Panel by PCR     Status: None   Collection Time: 08/09/17 12:34 PM  Result Value Ref Range Status  Adenovirus NOT DETECTED NOT DETECTED Final   Coronavirus 229E NOT DETECTED NOT DETECTED Final   Coronavirus HKU1 NOT DETECTED NOT DETECTED Final   Coronavirus NL63 NOT DETECTED NOT DETECTED Final   Coronavirus OC43 NOT DETECTED NOT DETECTED Final   Metapneumovirus NOT DETECTED NOT DETECTED Final   Rhinovirus / Enterovirus NOT DETECTED NOT DETECTED Final   Influenza A NOT DETECTED NOT DETECTED Final   Influenza B NOT DETECTED NOT DETECTED Final   Parainfluenza Virus 1 NOT DETECTED NOT DETECTED Final   Parainfluenza Virus 2 NOT DETECTED NOT DETECTED Final   Parainfluenza Virus 3 NOT DETECTED NOT DETECTED Final   Parainfluenza Virus 4 NOT DETECTED NOT DETECTED Final   Respiratory Syncytial Virus NOT DETECTED NOT DETECTED Final   Bordetella pertussis NOT DETECTED NOT DETECTED Final   Chlamydophila pneumoniae NOT DETECTED NOT DETECTED Final   Mycoplasma pneumoniae NOT DETECTED NOT DETECTED Final    Comment: Performed at [facility], [place] [address] [place], [place] [address]  Rapid strep screen     Status: None   Collection Time: 08/09/17  5:39 PM  Result Value Ref Range Status   Streptococcus, Group A Screen (Direct) NEGATIVE NEGATIVE Final    Comment: (NOTE) A Rapid Antigen test may result negative if the antigen level in the sample is below  the detection level of this test. The FDA has not cleared this test as a stand-alone test therefore the rapid antigen negative result has reflexed to a Group A Strep culture. Performed at [facility], [place] [address] [place], [place] [address]   Culture, group A strep     Status: None   Collection Time: 08/09/17  5:39 PM  Result Value Ref Range Status   Specimen Description   Final    THROAT Performed at [facility] [address] [place], [place] [address]    Special Requests   Final    NONE Reflexed from [id number] Performed at [facility], [place] [address], [place], [place] [address]    Culture   Final    NO GROUP A STREP (S.PYOGENES) ISOLATED Performed at [facility], [place] [address], [place], [place] [address]    Report Status 08/12/2017 FINAL  Final  MRSA PCR Screening     Status: None   Collection Time: 08/09/17  8:16 PM  Result Value Ref Range Status   MRSA by PCR NEGATIVE NEGATIVE Final    Comment:        The GeneXpert MRSA Assay (FDA approved for NASAL specimens only), is one component of a comprehensive MRSA colonization surveillance program. It is not intended to diagnose MRSA infection nor to guide or monitor treatment for MRSA infections. Performed at [facility], [place] [address] [place], [place] [address]   Culture, blood (routine x 2)     Status: None (Preliminary result)   Collection Time: 08/09/17  9:29 PM  Result Value Ref Range Status   Specimen Description   Final    BLOOD RIGHT ARM Performed at [place] [address] [place], [place] [address]    Special Requests   Final    Blood Culture adequate volume BOTTLES DRAWN AEROBIC AND ANAEROBIC Performed at [place] [address] [place], [place] [address]    Culture   Final    NO GROWTH 4 DAYS Performed at [facility], [place] [address], [address], [place]  [address]  Report Status PENDING  Incomplete  Culture, blood (routine x 2)     Status: None (Preliminary result)   Collection Time: 08/09/17 10:02 PM  Result Value Ref Range Status   Specimen Description   Final    BLOOD RIGHT ANTECUBITAL Performed at [place] [address], [place], [place] [address]    Special Requests   Final    BOTTLES DRAWN AEROBIC AND ANAEROBIC Blood Culture adequate volume Performed at [place] [address] [place], [place] [address]    Culture   Final    NO GROWTH 4 DAYS Performed at [facility], [place] [address], [place], [place] [address]    Report Status PENDING  Incomplete      Radiology Studies: No results found.    Scheduled Meds: . flecainide  75 mg Oral Q12H  . heparin  5,000 Units Subcutaneous Q8H  . irbesartan  300 mg Oral Daily  . mouth rinse  15 mL Mouth Rinse BID  . Melatonin  3 mg Oral QHS  . metoprolol tartrate  12.5 mg Oral BID  . polyethylene glycol  17 g Oral Daily  . tamsulosin  0.4 mg Oral Daily   Continuous Infusions: .  ceFAZolin (ANCEF) IV Stopped (08/14/17 1523)     LOS: 5 days    Time spent: Total of 25 minutes spent with pt, greater than 50% of which was spent in discussion of  treatment, counseling and coordination of care   Chipper Oman, MD Pager: Text Page via [email or web address]   If 7PM-7AM, please contact night-coverage [email or web address] 08/14/2017, 5:01 PM   Note - This record has been created using [employer]. Chart creation errors have been sought, but may not always have been located. Such creation errors do not reflect on the standard of medical care.

## 2017-08-14 NOTE — Telephone Encounter (Signed)
Spoke to [place] and he stated that the patient is back in the hospital but will presume with verbal orders if appropriate.

## 2017-08-14 NOTE — Progress Notes (Signed)
LUE venous duplex prelim: negative for DVT. Superficial thrombosis noted in the left cephalic vein, forearm to wrist. Landry Mellow, RDMS, RVT

## 2017-08-15 LAB — URINE CULTURE: CULTURE: NO GROWTH

## 2017-08-15 LAB — CULTURE, BLOOD (ROUTINE X 2)
CULTURE: NO GROWTH
Culture: NO GROWTH
SPECIAL REQUESTS: ADEQUATE
Special Requests: ADEQUATE

## 2017-08-15 MED ORDER — CEPHALEXIN 500 MG PO CAPS
500.0000 mg | ORAL_CAPSULE | Freq: Two times a day (BID) | ORAL | Status: DC
  Administered 2017-08-15 – 2017-08-16 (×2): 500 mg via ORAL
  Filled 2017-08-15 (×2): qty 1

## 2017-08-15 MED ORDER — FUROSEMIDE 20 MG PO TABS
20.0000 mg | ORAL_TABLET | Freq: Every day | ORAL | Status: DC
  Administered 2017-08-15 – 2017-08-16 (×2): 20 mg via ORAL
  Filled 2017-08-15 (×2): qty 1

## 2017-08-15 NOTE — Progress Notes (Signed)
Jonathon Fantasia, NP on call made aware of pt's c/o tip of penis burning and inability to urinate.  Bladder scan revealed >928mL in bladder. Will continue to monitor pt and carry out any new orders. Carnella [place] I

## 2017-08-15 NOTE — Progress Notes (Signed)
LCSW following for SNF placement.  Patient is not ready for dc today. Patient will go to SNF at dc.   LCSW will continue to follow.  Carolin Coy [place]

## 2017-08-15 NOTE — Progress Notes (Signed)
PROGRESS NOTE [facility]   Jonathon [name]   OHY:[id number] DOB: 25-Nov-1935  DOA: 08/09/2017 PCP: Jonathon Lor, MD   Brief Narrative:  Jonathon [name] is a 82 year old male with medical history significant for asthma, chronic diastolic CHF, chronic inflammatory demyelinating polyneuropathy, hypertension who presented to the ED on 3/29 with new changes on mental status and fever.  Patient was admitted with concerns of possible serotonin syndrome.  Subjective: Patient seen and examined, continues to improve mentally.  Today complaining of mild swelling of the legs.  Otherwise feeling well.  No acute events overnight  Assessment & Plan: Toxic metabolic encephalopathy - mentally back to baseline Suspect related to serotonin syndrome, no infectious process initially identified.  Viral respiratory panel negative, neurology was consulted recommended Ativan 2 mg every 6 hours, this has been taper off.  No further recommendations.  Patient at baseline with dementia.  Psychiatry was consulted and recommended do not restart Lexapro, trazodone or Zyprexa. C/w Melatonin nightly. Out of bed as tolerated.   Fever Fever now have defervescence Initially felt to be secondary to serotonin syndrome, then thought of cellulitis, mild low-grade temp this afternoon.  Will complete treatment for suspected left arm cellulitis.  Keflex for 5 days Initial UA unremarkable, chest x-ray clear, blood cultures negative  R upper extremity swelling due to superficial vein thrombosis - significantly improved Initial thought was cellulitis, was treated with antibiotic with no significant improvement.  Will discontinue cefazolin as  Doppler ultrasound shows superficial vein thrombosis.  Continue warm compresses and aspirin.  Keep arm elevated  Hypertension He was treated with IV ACEI, Cardene ggt and IV beta-blocker BP has improved we will continue with metoprolol 25 mg daily and Avapro. 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  If no voiding in 6-hour reinsert Foley and follow-up with urology. Continue Flomax 0.8 mg daily  DVT prophylaxis: Heparin SQ Code Status: Full code Family Communication: None at bedside Disposition Plan: SNF in a.m. if remains afebrile.   Consultants:   Neurology  Psychiatry  Procedures:     Antimicrobials: Anti-infectives (From admission, onward)   Start     Dose/Rate Route Frequency Ordered Stop   08/13/17 1200  ceFAZolin (ANCEF) IVPB 1 g/50 mL premix  Status:  Discontinued     1 g 100 mL/hr over 30 Minutes Intravenous Every 8 hours 08/13/17 1119 08/14/17 1714   08/09/17 1515  vancomycin (VANCOCIN) IVPB 1000 mg/200 mL premix     1,000 mg 200 mL/hr over 60 Minutes Intravenous  Once 08/09/17 1503 08/09/17 1837   08/09/17 1515  piperacillin-tazobactam (ZOSYN) IVPB 3.375 g     3.375 g 100 mL/hr over 30 Minutes Intravenous  Once 08/09/17 1503 08/09/17 1718        Objective: Vitals:   08/15/17 0414 08/15/17 0919 08/15/17 1422 08/15/17 1704  BP: (!) 157/76 (!) 155/72 (!) 148/73 (!) 142/76  Pulse: 89 88 91 88  Resp: 20  (!) 25 20  Temp: 99.6 F (37.6 C)  (!) 100.5 F (38.1 C) 99.5 F (37.5 C)  TempSrc: Oral  Oral Oral  SpO2: 98%  98%   Weight:  81.9 kg (180 lb 8.9 oz)     Height:        Intake/Output Summary (Last 24 hours) at 08/15/2017 1859 Last data filed at 08/15/2017 1707 Gross per 24 hour  Intake 480 ml  Output 1050 ml  Net -570 ml   Filed Weights   08/13/17 0257 08/14/17 0600 08/15/17 0414  Weight: 79.6 kg (175 lb 7.8 oz) 81.4 kg (179 lb 7.3 oz) 81.9  kg (180 lb 8.9 oz)    Examination:   General: Pt is alert, awake, not in acute distress Cardiovascular: RRR, S1/S2 +, no rubs, no gallops Respiratory: CTA bilaterally, no wheezing, no rhonchi Abdominal: Soft, NT, ND, bowel sounds + Extremities: Left upper extremity erythema and swelling significantly decreased.   General: Pt is alert, awake, not in acute distress Cardiovascular: RRR, S1/S2 +, no rubs, no gallops Abdominal: Soft, NT, ND, bowel sounds + GU: Foley catheter in place Neuro: Pleasantly confused Extremities: Left upper extremity erythema, swelling nontender in the forearm area.  LE no edema  Data Reviewed: I have personally reviewed following labs and imaging studies  CBC: Recent Labs  Lab 08/08/17 1917 08/09/17 1234 08/10/17 0331 08/12/17 0339 08/14/17 0544  WBC 7.2 7.9 8.0 9.0 7.7  NEUTROABS  --  5.2  --   --  5.3  HGB 12.6* 11.3* 13.0 10.4* 9.4*  HCT 36.9* 34.8* 40.4 32.3* 28.3*  MCV 82.7 83.3 84.2 83.2 81.6  PLT 229 254 259 304 443   Basic Metabolic Panel: Recent Labs  Lab 08/10/17 0331 08/11/17 0336 08/12/17 0339 08/13/17 0523 08/14/17 0544  NA 142 143 141 139 139  K 4.2 4.0 3.6 3.6 3.6  CL 107 110 110 108 108  CO2 21* 20* 21* 21* 23  GLUCOSE 84 127* 128* 104* 92  BUN 23* 30* 45* 25* 18  CREATININE 0.94 1.06 1.74* 0.92 0.91  CALCIUM 9.0 8.9 8.5* 8.4* 8.3*   GFR: Estimated Creatinine Clearance: 68 mL/min (by C-G formula based on SCr of 0.91 mg/dL). Liver Function Tests: Recent Labs  Lab 08/08/17 1917 08/09/17 1234 08/10/17 0331  AST 90* 55* 50*  ALT 36 28 29  ALKPHOS 76 63 68  BILITOT 1.3* 1.2 1.0  PROT 8.1 7.2 7.8  ALBUMIN 3.3* 3.1* 3.3*   Recent Labs  Lab 08/09/17 1234  LIPASE 24   Recent Labs  Lab 08/08/17 2023  AMMONIA 14   Coagulation Profile: No results for input(s): INR, PROTIME in the last 168 hours. Cardiac Enzymes: Recent Labs  Lab 08/09/17 1234 08/10/17 2102  CKTOTAL 644* 308   BNP (last 3 results) Recent  Labs    01/06/17 1705  PROBNP 532.0*   HbA1C: No results for input(s): HGBA1C in the last 72 hours. CBG: No results for input(s): GLUCAP in the last 168 hours. Lipid Profile: No results for input(s): CHOL, HDL, LDLCALC, TRIG, CHOLHDL, LDLDIRECT in the last 72 hours. Thyroid Function Tests: No results for input(s): TSH, T4TOTAL, FREET4, T3FREE, THYROIDAB in the last 72 hours. Anemia Panel: No results for input(s): VITAMINB12, FOLATE, FERRITIN, TIBC, IRON, RETICCTPCT in the last 72 hours. Sepsis Labs: Recent Labs  Lab 08/09/17 1241  LATICACIDVEN 1.43    Recent Results (from the past 240 hour(s))  Urine culture     Status: None   Collection Time: 08/08/17  9:34 PM  Result Value Ref Range Status   Specimen Description URINE, CATHETERIZED  Final   Special Requests NONE  Final   Culture   Final    NO GROWTH  Performed at [facility], [place] [address] [place], [place] [address]    Report Status 08/10/2017 FINAL  Final  Respiratory Panel by PCR     Status: None   Collection Time: 08/09/17 12:34 PM  Result Value Ref Range Status   Adenovirus NOT DETECTED NOT DETECTED Final   Coronavirus 229E NOT DETECTED NOT DETECTED Final   Coronavirus HKU1 NOT DETECTED NOT DETECTED Final   Coronavirus NL63 NOT DETECTED NOT DETECTED Final   Coronavirus OC43 NOT DETECTED NOT DETECTED Final   Metapneumovirus NOT DETECTED NOT DETECTED Final   Rhinovirus / Enterovirus NOT DETECTED NOT DETECTED Final   Influenza A NOT DETECTED NOT DETECTED Final   Influenza B NOT DETECTED NOT DETECTED Final   Parainfluenza Virus 1 NOT DETECTED NOT DETECTED Final   Parainfluenza Virus 2 NOT DETECTED NOT DETECTED Final   Parainfluenza Virus 3 NOT DETECTED NOT DETECTED Final   Parainfluenza Virus 4 NOT DETECTED NOT DETECTED Final   Respiratory Syncytial Virus NOT DETECTED NOT DETECTED Final   Bordetella pertussis NOT DETECTED NOT DETECTED Final   Chlamydophila pneumoniae NOT DETECTED NOT DETECTED Final    Mycoplasma pneumoniae NOT DETECTED NOT DETECTED Final    Comment: Performed at [facility], [place] [address], [place], [place] [address]  Rapid strep screen     Status: None   Collection Time: 08/09/17  5:39 PM  Result Value Ref Range Status   Streptococcus, Group A Screen (Direct) NEGATIVE NEGATIVE Final    Comment: (NOTE) A Rapid Antigen test may result negative if the antigen level in the sample is below the detection level of this test. The FDA has not cleared this test as a stand-alone test therefore the rapid antigen negative result has reflexed to a Group A Strep culture. Performed at [facility], [place] [address] [place], [place] [address]   Culture, group A strep     Status: None   Collection Time: 08/09/17  5:39 PM  Result Value Ref Range Status   Specimen Description   Final    THROAT Performed at [place] [address], [place], [place] [address]    Special Requests   Final    NONE Reflexed from [id number] Performed at [facility], [place] [address] [place], [place] [address]    Culture   Final    NO GROUP A STREP (S.PYOGENES) ISOLATED Performed at [facility], [place] [address] [place], [place] [address]    Report Status 08/12/2017 FINAL  Final  MRSA PCR Screening     Status: None   Collection Time: 08/09/17  8:16 PM  Result Value Ref Range Status   MRSA by PCR NEGATIVE NEGATIVE Final    Comment:        The GeneXpert MRSA Assay (FDA approved for NASAL specimens only), is one component of a comprehensive MRSA colonization surveillance program. It is not intended to diagnose MRSA infection nor to guide or monitor treatment for MRSA infections. Performed at [facility], [place] [address], [place], [place] [address]   Culture, blood (routine x 2)     Status: None   Collection Time: 08/09/17  9:29 PM  Result Value Ref Range Status   Specimen Description   Final    BLOOD RIGHT  ARM Performed at [place] [address] [place], [place] [address]    Special Requests   Final    Blood Culture adequate volume BOTTLES DRAWN  AEROBIC AND ANAEROBIC Performed at [place] [address] [place], [place] [address]    Culture   Final    NO GROWTH 5 DAYS Performed at [facility], [place] [address] [place], [place] [address]    Report Status 08/15/2017 FINAL  Final  Culture, blood (routine x 2)     Status: None   Collection Time: 08/09/17 10:02 PM  Result Value Ref Range Status   Specimen Description   Final    BLOOD RIGHT ANTECUBITAL Performed at [place] [address], [place], [place] [address]    Special Requests   Final    BOTTLES DRAWN AEROBIC AND ANAEROBIC Blood Culture adequate volume Performed at [place] [address] [place], [place] [address]    Culture   Final    NO GROWTH 5 DAYS Performed at [facility], [place] [address] [place], [place] [address]    Report Status 08/15/2017 FINAL  Final  Culture, Urine     Status: None   Collection Time: 08/13/17  6:53 PM  Result Value Ref Range Status   Specimen Description   Final    URINE, CLEAN CATCH Performed at [facility], [place] [address] [place], [place] [address]    Special Requests   Final    NONE Performed at [facility], [place] [address], [place], [place] [address]    Culture   Final    NO GROWTH Performed at [facility], [place] [address] [place], [place] [address]    Report Status 08/15/2017 FINAL  Final      Radiology Studies: No results found.    Scheduled Meds: . aspirin EC  81 mg Oral Daily  . flecainide  75 mg Oral Q12H  . furosemide  20 mg Oral Daily  . heparin  5,000 Units Subcutaneous Q8H  . irbesartan  300 mg Oral Daily  . mouth rinse  15 mL Mouth Rinse BID  . Melatonin  3 mg Oral QHS  . metoprolol tartrate  25 mg Oral BID   . polyethylene glycol  17 g Oral Daily  . tamsulosin  0.8 mg Oral Daily   Continuous Infusions:    LOS: 6 days    Time spent: Total of 15 minutes spent with pt, greater than 50% of which was spent in discussion of  treatment, counseling and coordination of care  Chipper Oman, MD Pager: Text Page via [email or web address]   If 7PM-7AM, please contact night-coverage [email or web address] 08/15/2017, 6:59 PM   Note - This record has been created using [employer]. Chart creation errors have been sought, but may not always have been located. Such creation errors do not reflect on the standard of medical care.

## 2017-08-15 NOTE — Progress Notes (Signed)
Physical Therapy Treatment Patient Details Name: Jonathon [name] MRN: [id number] DOB: 06/20/1935 Today's Date: 08/15/2017    History of Present Illness 82 yo male admitted with AMS and toxic metabolic encephalopathy. Hx of asthma, CHF, HTnm chronic inflammatory demyelinating polyneuropathy, pacemaker, dementia, HTN.     PT Comments    Improved cognition and mobility! Overall, Min assist for mobility. He walked a short distance on today. He still exhibits some mild confusion, weakness, impaired gait and balance. Continue to recommend SNF.    Follow Up Recommendations  SNF     Equipment Recommendations  None recommended by PT    Recommendations for Other Services       Precautions / Restrictions Precautions Precautions: Fall Restrictions Weight Bearing Restrictions: No    Mobility  Bed Mobility Overal bed mobility: Needs Assistance Bed Mobility: Sit to Supine       Sit to supine: Min guard   General bed mobility comments: close guard for safety.   Transfers Overall transfer level: Needs assistance Equipment used: Rolling walker (2 wheeled) Transfers: Sit to/from Stand Sit to Stand: Min assist;From elevated surface         General transfer comment: Assist to rise, stabilize, control descent. cueing required.   Ambulation/Gait Ambulation/Gait assistance: Min assist Ambulation Distance (Feet): 10 Feet(x2) Assistive device: Rolling walker (2 wheeled) Gait Pattern/deviations: Step-through pattern;Decreased stride length;Trunk flexed     General Gait Details: Assist to stabilize and maneuver safely with walker. Pt agreed to ambulate to and from bathroom. He declined hallway ambulation. Unsteady.    Stairs            Wheelchair Mobility    Modified Rankin (Stroke Patients Only)       Balance Overall balance assessment: Needs assistance         Standing balance support: Bilateral upper extremity supported Standing balance-Leahy Scale: Poor                               Cognition Arousal/Alertness: Awake/alert Behavior During Therapy: WFL for tasks assessed/performed Overall Cognitive Status: No family/caregiver present to determine baseline cognitive functioning                                 General Comments: Still note some mild confusion      Exercises      General Comments        Pertinent Vitals/Pain Pain Assessment: Faces Faces Pain Scale: Hurts a little bit Pain Location: catheter site Pain Descriptors / Indicators: Discomfort;Burning Pain Intervention(s): Monitored during session;Repositioned    Home Living                      Prior Function            PT Goals (current goals can now be found in the care plan section) Progress towards PT goals: Progressing toward goals    Frequency    Min 3X/week      PT Plan Current plan remains appropriate    Co-evaluation              AM-PAC PT "6 Clicks" Daily Activity  Outcome Measure  Difficulty turning over in bed (including adjusting bedclothes, sheets and blankets)?: A Little Difficulty moving from lying on back to sitting on the side of the bed? : A Little Difficulty sitting down on and standing up from a  chair with arms (e.g., wheelchair, bedside commode, etc,.)?: A Little Help needed moving to and from a bed to chair (including a wheelchair)?: A Little Help needed walking in hospital room?: A Little Help needed climbing 3-5 steps with a railing? : A Lot 6 Click Score: 17    End of Session Equipment Utilized During Treatment: Gait belt Activity Tolerance: Patient tolerated treatment well Patient left: in bed;with call bell/phone within reach;with bed alarm set   PT Visit Diagnosis: Muscle weakness (generalized) (M62.81);Difficulty in walking, not elsewhere classified (R26.2);Other abnormalities of gait and mobility (R26.89)     Time: 0981-1914 PT Time Calculation (min) (ACUTE ONLY): 24 min  Charges:   $Gait Training: 8-22 mins                    G Codes:          [name] Anna, MPT Pager: [phone]

## 2017-08-15 NOTE — Progress Notes (Signed)
Occupational Therapy Treatment Patient Details Name: Jonathon [name] MRN: [id number] DOB: 1935/08/09 Today's Date: 08/15/2017    History of present illness 82 yo male admitted with AMS and toxic metabolic encephalopathy. Hx of asthma, CHF, HTnm chronic inflammatory demyelinating polyneuropathy, pacemaker, dementia, HTN.    OT comments  Pt making good progress:  Overall min A.  Continue to recommend SNF for rehab  Follow Up Recommendations  SNF    Equipment Recommendations  None recommended by OT    Recommendations for Other Services      Precautions / Restrictions Precautions Precautions: Fall Restrictions Weight Bearing Restrictions: No       Mobility Bed Mobility Overal bed mobility: Needs Assistance Bed Mobility: Sit to Supine       Sit to supine: Min guard   General bed mobility comments: close guard for safety.   Transfers Overall transfer level: Needs assistance Equipment used: Rolling walker (2 wheeled) Transfers: Sit to/from Stand Sit to Stand: Min assist         General transfer comment: Assist to rise, stabilize, control descent. cueing required.     Balance Overall balance assessment: Needs assistance         Standing balance support: Bilateral upper extremity supported Standing balance-Leahy Scale: Poor                             ADL either performed or assessed with clinical judgement   ADL       Grooming: Wash/dry hands;Min guard;Standing                   Toilet Transfer: Minimal assistance;Ambulation;Comfort height toilet;RW   Toileting- Clothing Manipulation and Hygiene: Minimal assistance;Sit to/from stand;Cueing for safety;Cueing for sequencing         General ADL Comments: pt initially sitting at EOB trying to urinate:  he has foley in place. Ambulated to bathroom and used commode for bowel movement.  Multimodal cues to side step through tight spaces. Pt tended to release hands from RW and hold onto wall and  grab bar. Cues given for safety     Vision       Perception     Praxis      Cognition Arousal/Alertness: Awake/alert Behavior During Therapy: WFL for tasks assessed/performed Overall Cognitive Status: No family/caregiver present to determine baseline cognitive functioning                                 General Comments: mild confusion; multimodal cues to sidestep through bathroom        Exercises     Shoulder Instructions       General Comments      Pertinent Vitals/ Pain       Pain Assessment: Faces Faces Pain Scale: Hurts a little bit Pain Location: catheter site Pain Descriptors / Indicators: Discomfort;Burning Pain Intervention(s): Limited activity within patient's tolerance;Monitored during session;Repositioned  Home Living                                          Prior Functioning/Environment              Frequency  Min 2X/week        Progress Toward Goals  OT Goals(current goals can now be found in the care plan  section)  Progress towards OT goals: Progressing toward goals     Plan      Co-evaluation    PT/OT/SLP Co-Evaluation/Treatment: Yes Reason for Co-Treatment: For patient/therapist safety PT goals addressed during session: Mobility/safety with mobility OT goals addressed during session: ADL's and self-care      AM-PAC PT "6 Clicks" Daily Activity     Outcome Measure   Help from another person eating meals?: None Help from another person taking care of personal grooming?: A Little Help from another person toileting, which includes using toliet, bedpan, or urinal?: A Little Help from another person bathing (including washing, rinsing, drying)?: A Little Help from another person to put on and taking off regular upper body clothing?: A Little Help from another person to put on and taking off regular lower body clothing?: A Little 6 Click Score: 19    End of Session    OT Visit Diagnosis:  Muscle weakness (generalized) (M62.81);Unsteadiness on feet (R26.81)   Activity Tolerance Patient tolerated treatment well   Patient Left in bed;with call bell/phone within reach;with bed alarm set   Nurse Communication          Time: 5784-6962 OT Time Calculation (min): 24 min  Charges: OT General Charges $OT Visit: 1 Visit OT Treatments $Self Care/Home Management : 8-22 mins  Lesle Chris, OTR/L [phone] 08/15/2017   Jonathon [name] 08/15/2017, 12:47 PM

## 2017-08-16 DIAGNOSIS — R300 Dysuria: Secondary | ICD-10-CM | POA: Diagnosis not present

## 2017-08-16 DIAGNOSIS — N400 Enlarged prostate without lower urinary tract symptoms: Secondary | ICD-10-CM | POA: Diagnosis present

## 2017-08-16 DIAGNOSIS — N3001 Acute cystitis with hematuria: Secondary | ICD-10-CM | POA: Diagnosis present

## 2017-08-16 DIAGNOSIS — G9341 Metabolic encephalopathy: Secondary | ICD-10-CM | POA: Diagnosis not present

## 2017-08-16 DIAGNOSIS — I110 Hypertensive heart disease with heart failure: Secondary | ICD-10-CM | POA: Diagnosis present

## 2017-08-16 DIAGNOSIS — Y846 Urinary catheterization as the cause of abnormal reaction of the patient, or of later complication, without mention of misadventure at the time of the procedure: Secondary | ICD-10-CM | POA: Diagnosis present

## 2017-08-16 DIAGNOSIS — G6181 Chronic inflammatory demyelinating polyneuritis: Secondary | ICD-10-CM | POA: Diagnosis not present

## 2017-08-16 DIAGNOSIS — R296 Repeated falls: Secondary | ICD-10-CM | POA: Diagnosis present

## 2017-08-16 DIAGNOSIS — I10 Essential (primary) hypertension: Secondary | ICD-10-CM | POA: Diagnosis not present

## 2017-08-16 DIAGNOSIS — R351 Nocturia: Secondary | ICD-10-CM | POA: Diagnosis not present

## 2017-08-16 DIAGNOSIS — G9340 Encephalopathy, unspecified: Secondary | ICD-10-CM | POA: Diagnosis not present

## 2017-08-16 DIAGNOSIS — S299XXA Unspecified injury of thorax, initial encounter: Secondary | ICD-10-CM | POA: Diagnosis not present

## 2017-08-16 DIAGNOSIS — Z833 Family history of diabetes mellitus: Secondary | ICD-10-CM | POA: Diagnosis not present

## 2017-08-16 DIAGNOSIS — M25552 Pain in left hip: Secondary | ICD-10-CM | POA: Diagnosis not present

## 2017-08-16 DIAGNOSIS — S0181XA Laceration without foreign body of other part of head, initial encounter: Secondary | ICD-10-CM | POA: Diagnosis not present

## 2017-08-16 DIAGNOSIS — S0003XA Contusion of scalp, initial encounter: Secondary | ICD-10-CM | POA: Diagnosis present

## 2017-08-16 DIAGNOSIS — I82611 Acute embolism and thrombosis of superficial veins of right upper extremity: Secondary | ICD-10-CM | POA: Diagnosis not present

## 2017-08-16 DIAGNOSIS — N179 Acute kidney failure, unspecified: Secondary | ICD-10-CM | POA: Diagnosis not present

## 2017-08-16 DIAGNOSIS — N401 Enlarged prostate with lower urinary tract symptoms: Secondary | ICD-10-CM | POA: Diagnosis not present

## 2017-08-16 DIAGNOSIS — E876 Hypokalemia: Secondary | ICD-10-CM | POA: Diagnosis not present

## 2017-08-16 DIAGNOSIS — G92 Toxic encephalopathy: Secondary | ICD-10-CM | POA: Diagnosis not present

## 2017-08-16 DIAGNOSIS — Z466 Encounter for fitting and adjustment of urinary device: Secondary | ICD-10-CM | POA: Diagnosis not present

## 2017-08-16 DIAGNOSIS — Z7982 Long term (current) use of aspirin: Secondary | ICD-10-CM | POA: Diagnosis not present

## 2017-08-16 DIAGNOSIS — W1830XA Fall on same level, unspecified, initial encounter: Secondary | ICD-10-CM | POA: Diagnosis present

## 2017-08-16 DIAGNOSIS — Z950 Presence of cardiac pacemaker: Secondary | ICD-10-CM | POA: Diagnosis not present

## 2017-08-16 DIAGNOSIS — L03114 Cellulitis of left upper limb: Secondary | ICD-10-CM | POA: Diagnosis not present

## 2017-08-16 DIAGNOSIS — M6281 Muscle weakness (generalized): Secondary | ICD-10-CM | POA: Diagnosis not present

## 2017-08-16 DIAGNOSIS — S62202A Unspecified fracture of first metacarpal bone, left hand, initial encounter for closed fracture: Secondary | ICD-10-CM | POA: Diagnosis present

## 2017-08-16 DIAGNOSIS — T50995D Adverse effect of other drugs, medicaments and biological substances, subsequent encounter: Secondary | ICD-10-CM | POA: Diagnosis not present

## 2017-08-16 DIAGNOSIS — R488 Other symbolic dysfunctions: Secondary | ICD-10-CM | POA: Diagnosis not present

## 2017-08-16 DIAGNOSIS — I82890 Acute embolism and thrombosis of other specified veins: Secondary | ICD-10-CM | POA: Diagnosis not present

## 2017-08-16 DIAGNOSIS — S0081XA Abrasion of other part of head, initial encounter: Secondary | ICD-10-CM | POA: Diagnosis present

## 2017-08-16 DIAGNOSIS — S098XXA Other specified injuries of head, initial encounter: Secondary | ICD-10-CM | POA: Diagnosis not present

## 2017-08-16 DIAGNOSIS — R339 Retention of urine, unspecified: Secondary | ICD-10-CM | POA: Diagnosis not present

## 2017-08-16 DIAGNOSIS — Z87891 Personal history of nicotine dependence: Secondary | ICD-10-CM | POA: Diagnosis not present

## 2017-08-16 DIAGNOSIS — Z8249 Family history of ischemic heart disease and other diseases of the circulatory system: Secondary | ICD-10-CM | POA: Diagnosis not present

## 2017-08-16 DIAGNOSIS — J45909 Unspecified asthma, uncomplicated: Secondary | ICD-10-CM | POA: Diagnosis not present

## 2017-08-16 DIAGNOSIS — E538 Deficiency of other specified B group vitamins: Secondary | ICD-10-CM | POA: Diagnosis not present

## 2017-08-16 DIAGNOSIS — R4182 Altered mental status, unspecified: Secondary | ICD-10-CM | POA: Diagnosis not present

## 2017-08-16 DIAGNOSIS — R509 Fever, unspecified: Secondary | ICD-10-CM | POA: Diagnosis not present

## 2017-08-16 DIAGNOSIS — R2681 Unsteadiness on feet: Secondary | ICD-10-CM | POA: Diagnosis not present

## 2017-08-16 DIAGNOSIS — A4152 Sepsis due to Pseudomonas: Secondary | ICD-10-CM | POA: Diagnosis present

## 2017-08-16 DIAGNOSIS — Y92121 Bathroom in nursing home as the place of occurrence of the external cause: Secondary | ICD-10-CM | POA: Diagnosis not present

## 2017-08-16 DIAGNOSIS — F0390 Unspecified dementia without behavioral disturbance: Secondary | ICD-10-CM | POA: Diagnosis not present

## 2017-08-16 DIAGNOSIS — S79912A Unspecified injury of left hip, initial encounter: Secondary | ICD-10-CM | POA: Diagnosis not present

## 2017-08-16 DIAGNOSIS — Z820 Family history of epilepsy and other diseases of the nervous system: Secondary | ICD-10-CM | POA: Diagnosis not present

## 2017-08-16 DIAGNOSIS — I2510 Atherosclerotic heart disease of native coronary artery without angina pectoris: Secondary | ICD-10-CM | POA: Diagnosis present

## 2017-08-16 DIAGNOSIS — I5032 Chronic diastolic (congestive) heart failure: Secondary | ICD-10-CM | POA: Diagnosis not present

## 2017-08-16 DIAGNOSIS — Z803 Family history of malignant neoplasm of breast: Secondary | ICD-10-CM | POA: Diagnosis not present

## 2017-08-16 DIAGNOSIS — T83511A Infection and inflammatory reaction due to indwelling urethral catheter, initial encounter: Secondary | ICD-10-CM | POA: Diagnosis present

## 2017-08-16 DIAGNOSIS — W19XXXA Unspecified fall, initial encounter: Secondary | ICD-10-CM | POA: Diagnosis not present

## 2017-08-16 DIAGNOSIS — S62512A Displaced fracture of proximal phalanx of left thumb, initial encounter for closed fracture: Secondary | ICD-10-CM | POA: Diagnosis not present

## 2017-08-16 DIAGNOSIS — I471 Supraventricular tachycardia: Secondary | ICD-10-CM | POA: Diagnosis not present

## 2017-08-16 DIAGNOSIS — R338 Other retention of urine: Secondary | ICD-10-CM | POA: Diagnosis not present

## 2017-08-16 MED ORDER — MEMANTINE HCL 5 MG PO TABS: 5.0000 mg | ORAL_TABLET | Freq: Two times a day (BID) | ORAL | Status: DC

## 2017-08-16 MED ORDER — TAMSULOSIN HCL 0.4 MG PO CAPS: 0.8000 mg | ORAL_CAPSULE | Freq: Every day | ORAL | 0 refills | Status: DC

## 2017-08-16 MED ORDER — ASPIRIN 81 MG PO TBEC: 81.0000 mg | DELAYED_RELEASE_TABLET | Freq: Every day | ORAL | Status: DC

## 2017-08-16 MED ORDER — CEPHALEXIN 500 MG PO CAPS: 500.0000 mg | ORAL_CAPSULE | Freq: Two times a day (BID) | ORAL | 0 refills | Status: AC

## 2017-08-16 MED ORDER — POLYETHYLENE GLYCOL 3350 17 G PO PACK: 17.0000 g | PACK | Freq: Every day | ORAL | 0 refills | Status: DC

## 2017-08-16 MED ORDER — METOPROLOL TARTRATE 25 MG PO TABS: 25.0000 mg | ORAL_TABLET | Freq: Two times a day (BID) | ORAL | Status: DC

## 2017-08-16 MED ORDER — MELATONIN 3 MG PO TABS: 3.0000 mg | ORAL_TABLET | Freq: Every day | ORAL | 0 refills | Status: DC

## 2017-08-16 NOTE — NC FL2 (Signed)
[place] LEVEL OF CARE SCREENING TOOL     IDENTIFICATION  Patient Name: Jonathon [name] Birthdate: 29-May-1935 Sex: male Admission Date (Current Location): 08/09/2017  [facility] and [place] Number:  Herbalist and Address:  [facility],  [place], [place]      Provider Number: [id number]  Attending Physician Name and Address:  Patrecia Pour, Christean Grief, MD  Relative Name and Phone Number:       Current Level of Care: Hospital Recommended Level of Care: [place] Prior Approval Number: [id number]  Date Approved/Denied:   PASRR Number:    Discharge Plan: SNF    Current Diagnoses: Patient Active Problem List   Diagnosis Date Noted  . Altered mental status   . Metabolic encephalopathy 94/76/5465  . Dementia 08/04/2017  . Seizure ([place]) 08/01/2017  . Thiamine deficiency 05/27/2017  . B12 deficiency 04/30/2017  . Alcoholism ([place]) 01/28/2017  . Acute encephalopathy 12/28/2016  . Hepatic steatosis 06/05/2016  . Physical deconditioning 06/05/2016  . Chronic diastolic congestive heart failure ([place]) 06/05/2016  . Orthostatic syncope   . Elevated liver function tests   . LOC (loss of consciousness) ([place]) 08/10/2014  . Syncope 08/10/2014  . CIDP (chronic inflammatory demyelinating polyneuropathy) ([place]) 08/08/2014  . Sustained SVT ([place]) 07/27/2014  . 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 Abnormal LFTs 08/06/2013  . Iron deficiency anemia 04/19/2013  . Muscle weakness 01/13/2013  . External hemorrhoids 03/23/2011  . Symptomatic bradycardia 10/25/2008  . 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 BENIGN PROSTATIC HYPERTROPHY, HX OF 10/25/2008  . PACEMAKER, PERMANENT 10/25/2008  . Essential hypertension 11/21/2006  . BPH (benign prostatic hyperplasia) 11/21/2006  . History of colonic polyps 11/21/2006  . NEPHROLITHIASIS, HX OF 11/21/2006    Orientation RESPIRATION BLADDER Height & Weight     Self, Time, Place  Normal  Incontinent Weight: 179 lb 14.3 oz (81.6 kg) Height:  5\' 10"  (177.8 cm)  BEHAVIORAL SYMPTOMS/MOOD NEUROLOGICAL BOWEL NUTRITION STATUS      Continent Diet(See dc summary)  AMBULATORY STATUS COMMUNICATION OF NEEDS Skin   Extensive Assist   Normal                       Personal Care Assistance Level of Assistance  Bathing, Feeding, Dressing Bathing Assistance: Limited assistance Feeding assistance: Independent Dressing Assistance: Limited assistance     Functional Limitations Info  Sight, Hearing, Speech Sight Info: Adequate Hearing Info: Adequate Speech Info: Adequate    SPECIAL CARE FACTORS FREQUENCY  OT (By licensed OT), PT (By licensed PT)     PT Frequency: 5x/week OT Frequency: 5x/week            Contractures Contractures Info: Not present    Additional Factors Info  Code Status, Allergies Code Status Info: Full Allergies Info: NKA           Current Medications (08/16/2017):  This is the current hospital active medication list Current Facility-Administered Medications  Medication Dose Route Frequency Provider Last Rate Last Dose  . acetaminophen (TYLENOL) tablet 650 mg  650 mg Oral Q6H PRN Donne Hazel, MD   650 mg at 08/15/17 1505   Or  . acetaminophen (TYLENOL) suppository 650 mg  650 mg Rectal Q6H PRN Donne Hazel, MD      . aspirin EC tablet 81 mg  81 mg Oral Daily Patrecia Pour, Christean Grief, MD   81 mg at 08/16/17 0354  . cephALEXin (KEFLEX) capsule 500 mg  500 mg Oral Q12H  Patrecia Pour, Christean Grief, MD   500 mg at 08/16/17 1583  . flecainide (TAMBOCOR) tablet 75 mg  75 mg Oral Q12H Patrecia Pour, Christean Grief, MD   75 mg at 08/16/17 1345  . furosemide (LASIX) tablet 20 mg  20 mg Oral Daily Patrecia Pour, Christean Grief, MD   20 mg at 08/16/17 0928  . heparin injection 5,000 Units  5,000 Units Subcutaneous Q8H Donne Hazel, MD   5,000 Units at 08/16/17 1424  . hydrALAZINE (APRESOLINE) injection 10 mg  10 mg Intravenous Q4H PRN Donne Hazel, MD   10 mg at 08/14/17 0602  .  irbesartan (AVAPRO) tablet 300 mg  300 mg Oral Daily Patrecia Pour, Christean Grief, MD   300 mg at 08/16/17 0940  . labetalol (NORMODYNE,TRANDATE) injection 5 mg  5 mg Intravenous Q2H PRN Donne Hazel, MD   5 mg at 08/10/17 1602  . MEDLINE mouth rinse  15 mL Mouth Rinse BID Donne Hazel, MD   15 mL at 08/16/17 0929  . Melatonin TABS 3 mg  3 mg Oral QHS Donne Hazel, MD   3 mg at 08/15/17 2125  . metoprolol tartrate (LOPRESSOR) tablet 25 mg  25 mg Oral BID Patrecia Pour, Christean Grief, MD   25 mg at 08/16/17 7680  . polyethylene glycol (MIRALAX / GLYCOLAX) packet 17 g  17 g Oral Daily Donne Hazel, MD   17 g at 08/16/17 8811  . tamsulosin (FLOMAX) capsule 0.8 mg  0.8 mg Oral Daily Patrecia Pour, Christean Grief, MD   0.8 mg at 08/16/17 0315     Discharge Medications: Please see discharge summary for a list of discharge medications.  Relevant Imaging Results:  Relevant Lab Results:   Additional Information [id number]  Wende Neighbors, LCSW

## 2017-08-16 NOTE — Discharge Summary (Signed)
Physician Discharge Summary  Jonathon [name]  ENI:[id number]  DOB: Sep 12, 1935  DOA: 08/09/2017 PCP: Marletta Lor, MD  Admit date: 08/09/2017 Discharge date: 08/16/2017  Admitted From: Home  Disposition: SNF   Recommendations for Outpatient Follow-up:  1. Follow up with PCP in 1 week  2. Please obtain BMP/CBC in one week to monitor renal function and Hbg  3. Need Urology consult as outpatient, for BPH and urinary retention   Discharge Condition: Stable  CODE STATUS: Full Code  Diet recommendation: Heart Healthy   Brief/Interim Summary: For full details see H&P/Progress note, but in brief, Jonathon [name] is a 82 year old male with medical history significant for asthma, chronic diastolic CHF, chronic inflammatory demyelinating polyneuropathy, hypertension who presented to the ED on 3/29 with new changes on mental status and fever.  Patient was admitted with concerns of possible serotonin syndrome.  Subjective: Patient seen and examined, remains confuse on and off. Periods off lucency and then confused, typical of dementia.  Overnight voiding trials were tried but patient was unable to void.  Patient was pushing and was having some pain but unable to urinate.  Discharge Diagnoses/Hospital Course:  Toxic metabolic encephalopathy - mentally close back to baseline Suspect related to serotonin syndrome, no infectious process initially identified.  Viral respiratory panel negative, neurology was consulted recommended Ativan 2 mg every 6 hours, this has been taper off.  No further recommendations.  Patient at baseline with dementia.  Psychiatry was consulted and recommended do not restart Lexapro, trazodone or Zyprexa. C/w Melatonin nightly. PT evaluated recommended SNF for STR.  Case discussed with neurology given baseline dementia patient will have a prolonged recovery of mental status.  Patient needs physical therapy at this point.  Advised family to continue to reorient patient.  Will add  low-dose Nemenda for dementia, not Aricept at this time given urinary retention.   Fever  Fever now have defervescence Initially felt to be secondary to serotonin syndrome, then thought of cellulitis, will complete treatment for suspected left arm cellulitis. Keflex for 5 days Initial UA unremarkable, chest x-ray clear, blood cultures negative  R upper extremity swelling due to superficial vein thrombosis - significantly improved Initial thought was cellulitis, was treated with antibiotic with no significant improvement.  Will discontinue cefazolin as  Doppler ultrasound shows superficial vein thrombosis.  Continue warm compresses and aspirin.  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  Patient Flomax increase it to 0.8 mg and Foley is being kept in place.  Need urology follow-up as an outpatient.  All other chronic medical condition were stable during the hospitalization.  Patient was seen by physical therapy, recommending SNF On the day of the discharge the patient's vitals were stable, and no other acute medical condition were reported by patient. the patient was felt safe to be discharge to SNF  Discharge Instructions  You were cared for by a hospitalist during your hospital stay. If you have any questions about your discharge medications or the care you received while you were in the hospital after you are  discharged, you can call the unit and asked to speak with the hospitalist on call if the hospitalist that took  care of you is not available. Once you are discharged, your primary care physician will handle any further medical issues. Please note that NO REFILLS for any discharge medications will be authorized once you are discharged, as it is imperative that you return to your primary care physician (or establish a relationship with a primary care physician if you do not have one) for your aftercare needs so that they can reassess your need for medications and monitor your lab values.  Discharge Instructions    Call MD for:  difficulty breathing, headache or visual disturbances   Complete by:  As directed    Call MD for:  extreme fatigue   Complete by:  As directed    Call MD for:  hives   Complete by:  As directed    Call MD for:  persistant dizziness or light-headedness   Complete by:  As directed    Call MD for:  persistant nausea and vomiting   Complete by:  As directed    Call MD for:  redness, tenderness, or signs of infection (pain, swelling, redness, odor or green/yellow discharge around incision site)   Complete by:  As directed    Call MD for:  severe uncontrolled pain   Complete by:  As directed    Call MD for:  temperature >100.4   Complete by:  As directed    Diet - low sodium heart healthy   Complete by:  As directed    Increase activity slowly   Complete by:  As directed      Allergies as of 08/16/2017   No Known Allergies     Medication List    STOP taking these medications   colchicine 0.6 MG tablet   escitalopram 5 MG tablet Commonly known as:  LEXAPRO   metoprolol succinate 25 MG 24 hr tablet Commonly known as:  TOPROL-XL   OLANZapine 5 MG tablet Commonly known as:  ZYPREXA   traZODone 50 MG tablet Commonly known as:  DESYREL     TAKE these medications   aspirin 81 MG EC tablet Take 1 tablet (81 mg total) by mouth daily. Start taking on:  08/17/2017   cephALEXin 500 MG capsule Commonly known as:  KEFLEX Take 1 capsule (500 mg total) by mouth every 12  (twelve) hours for 3 days.   feeding supplement (ENSURE ENLIVE) Liqd Take 237 mLs by mouth 2 (two) times daily between meals.   flecainide 150 MG tablet Commonly known as:  TAMBOCOR TAKE 1/2 TABLET BY MOUTH TWICE DAILY   furosemide 20 MG tablet Commonly known as:  LASIX Take 1 tablet (20 mg total) by mouth daily.   Garlic 0960 MG Caps Take 2,000 mg by mouth daily.   irbesartan 300 MG tablet Commonly known as:  AVAPRO TAKE 1 TABLET(300 MG) BY MOUTH DAILY   Melatonin 3 MG Tabs Take 1 tablet (3 mg total) by mouth at bedtime.   memantine 5 MG tablet Commonly known as:  NAMENDA Take 1 tablet (5 mg total) by mouth 2 (two) times daily.   metoprolol tartrate 25 MG tablet Commonly known as:  LOPRESSOR Take 1 tablet (25 mg total) by mouth 2 (two) times daily.   polyethylene glycol packet Commonly known as:  MIRALAX / GLYCOLAX Take 17 g by mouth daily. Start taking on:  08/17/2017   tamsulosin 0.4 MG Caps capsule Commonly known  as:  FLOMAX Take 2 capsules (0.8 mg total) by mouth daily. What changed:  how much to take   vitamin B-12 1000 MCG tablet Commonly known as:  CYANOCOBALAMIN Take 1,000 mcg by mouth daily.   Vitamin D (Ergocalciferol) 50000 units Caps capsule Commonly known as:  DRISDOL Take 1 capsule (50,000 Units total) by mouth every 7 (seven) days.       No Known Allergies  Consultations:  Neurology   Psychiatry    Procedures/Studies: Dg Chest 1 View  Result Date: 08/08/2017 CLINICAL DATA:  Altered mental status EXAM: CHEST  1 VIEW COMPARISON:  08/01/2017 FINDINGS: Left-sided pacing device as before. No consolidation or effusion. Stable cardiomediastinal silhouette. No pneumothorax. IMPRESSION: No active disease. Electronically Signed   By: Donavan Foil M.D.   On: 08/08/2017 20:10   Dg Chest 2 View  Result Date: 08/09/2017 CLINICAL DATA:  Fever. EXAM: CHEST - 2 VIEW COMPARISON:  08/08/2017 FINDINGS: Left chest wall pacer device is noted with lead in  the right atrial appendage and right ventricle. The heart size appears normal. There is no pleural effusion or edema. No airspace opacities. IMPRESSION: 1. No acute cardiopulmonary abnormalities. Electronically Signed   By: Kerby Moors M.D.   On: 08/09/2017 14:48   Dg Chest 2 View  Result Date: 08/01/2017 CLINICAL DATA:  Altered mental status. EXAM: CHEST - 2 VIEW COMPARISON:  12/28/2016 FINDINGS: Dual lead cardiac pacemaker in stable position. Cardiomediastinal silhouette is normal. Mediastinal contours appear intact. Tortuosity and calcific atherosclerotic disease of the aorta. There is no evidence of focal airspace consolidation, pleural effusion or pneumothorax. Osseous structures are without acute abnormality. Soft tissues are grossly normal. IMPRESSION: No active cardiopulmonary disease. Electronically Signed   By: Fidela Salisbury M.D.   On: 08/01/2017 11:00   Ct Head Wo Contrast  Result Date: 08/08/2017 CLINICAL DATA:  Altered mental status.  Found down. EXAM: CT HEAD WITHOUT CONTRAST TECHNIQUE: Contiguous axial images were obtained from the base of the skull through the vertex without intravenous contrast. COMPARISON:  08/01/2017 FINDINGS: Brain: No mass lesion, intraparenchymal hemorrhage or extra-axial collection. No evidence of acute cortical infarct. Normal appearance of the brain parenchyma and extra axial spaces for age. Vascular: No hyperdense vessel or unexpected vascular calcification. Skull: Normal visualized skull base, calvarium and extracranial soft tissues. Sinuses/Orbits: No sinus fluid levels or advanced mucosal thickening. No mastoid effusion. Chronic fracture of the right lamina papyracea. IMPRESSION: Normal aging brain. Electronically Signed   By: Ulyses Jarred M.D.   On: 08/08/2017 20:08   Ct Head Wo Contrast  Result Date: 08/01/2017 CLINICAL DATA:  Seizure.  Altered mental status. EXAM: CT HEAD WITHOUT CONTRAST TECHNIQUE: Contiguous axial images were obtained from the  base of the skull through the vertex without intravenous contrast. COMPARISON:  12/28/2016 FINDINGS: Brain: Ventricles, cisterns and other CSF spaces are normal. There is mild chronic ischemic microvascular disease. There is no mass, mass effect, shift of midline structures or acute hemorrhage. There is no evidence of acute infarction. Vascular: No hyperdense vessel or unexpected calcification. Skull: Normal. Negative for fracture or focal lesion. Sinuses/Orbits: No acute finding. Other: None. IMPRESSION: No acute findings. Mild chronic ischemic microvascular disease. Electronically Signed   By: Marin Olp M.D.   On: 08/01/2017 16:10     Discharge Exam: Vitals:   08/15/17 2121 08/16/17 0553  BP: (!) 158/81 (!) 156/92  Pulse: 82 76  Resp: 18 18  Temp: 98.5 F (36.9 C) 98 F (36.7 C)  SpO2: 99% 100%  Vitals:   08/15/17 1422 08/15/17 1704 08/15/17 2121 08/16/17 0553  BP: (!) 148/73 (!) 142/76 (!) 158/81 (!) 156/92  Pulse: 91 88 82 76  Resp: (!) 25 20 18 18   Temp: (!) 100.5 F (38.1 C) 99.5 F (37.5 C) 98.5 F (36.9 C) 98 F (36.7 C)  TempSrc: Oral Oral Oral Oral  SpO2: 98%  99% 100%  Weight:    81.6 kg (179 lb 14.3 oz)  Height:        General: Pt is alert, awake, not in acute distress Cardiovascular: RRR, S1/S2 +, no rubs, no gallops Respiratory: CTA bilaterally, no wheezing, no rhonchi Abdominal: Soft, NT, ND, bowel sounds + GU: Foley cath in Place  Extremities: no edema Neuro: Pleasantly confused, Oriented to self and place. Non focal    The results of significant diagnostics from this hospitalization (including imaging, microbiology, ancillary and laboratory) are listed below for reference.     Microbiology: Recent Results (from the past 240 hour(s))  Urine culture     Status: None   Collection Time: 08/08/17  9:34 PM  Result Value Ref Range Status   Specimen Description URINE, CATHETERIZED  Final   Special Requests NONE  Final   Culture   Final    NO  GROWTH Performed at [facility], [address] [place], [place] [address]    Report Status 08/10/2017 FINAL  Final  Respiratory Panel by PCR     Status: None   Collection Time: 08/09/17 12:34 PM  Result Value Ref Range Status   Adenovirus NOT DETECTED NOT DETECTED Final   Coronavirus 229E NOT DETECTED NOT DETECTED Final   Coronavirus HKU1 NOT DETECTED NOT DETECTED Final   Coronavirus NL63 NOT DETECTED NOT DETECTED Final   Coronavirus OC43 NOT DETECTED NOT DETECTED Final   Metapneumovirus NOT DETECTED NOT DETECTED Final   Rhinovirus / Enterovirus NOT DETECTED NOT DETECTED Final   Influenza A NOT DETECTED NOT DETECTED Final   Influenza B NOT DETECTED NOT DETECTED Final   Parainfluenza Virus 1 NOT DETECTED NOT DETECTED Final   Parainfluenza Virus 2 NOT DETECTED NOT DETECTED Final   Parainfluenza Virus 3 NOT DETECTED NOT DETECTED Final   Parainfluenza Virus 4 NOT DETECTED NOT DETECTED Final   Respiratory Syncytial Virus NOT DETECTED NOT DETECTED Final   Bordetella pertussis NOT DETECTED NOT DETECTED Final   Chlamydophila pneumoniae NOT DETECTED NOT DETECTED Final   Mycoplasma pneumoniae NOT DETECTED NOT DETECTED Final    Comment: Performed at [facility], [place] [address] [place], [place] [address]  Rapid strep screen     Status: None   Collection Time: 08/09/17  5:39 PM  Result Value Ref Range Status   Streptococcus, Group A Screen (Direct) NEGATIVE NEGATIVE Final    Comment: (NOTE) A Rapid Antigen test may result negative if the antigen level in the sample is below the detection level of this test. The FDA has not cleared this test as a stand-alone test therefore the rapid antigen negative result has reflexed to a Group A Strep culture. Performed at [facility], [place] [address] [place], [place] [address]   Culture, group A strep     Status: None   Collection Time: 08/09/17  5:39 PM  Result Value Ref Range Status   Specimen Description   Final     THROAT Performed at [place] [address] [place],  [address]    Special Requests   Final  NONE Reflexed from [id number] Performed at [facility], [place] [address], [place], [place] [address]    Culture   Final    NO GROUP A STREP (S.PYOGENES) ISOLATED Performed at [facility], [place] [address], [place], [place] [address]    Report Status 08/12/2017 FINAL  Final  MRSA PCR Screening     Status: None   Collection Time: 08/09/17  8:16 PM  Result Value Ref Range Status   MRSA by PCR NEGATIVE NEGATIVE Final    Comment:        The GeneXpert MRSA Assay (FDA approved for NASAL specimens only), is one component of a comprehensive MRSA colonization surveillance program. It is not intended to diagnose MRSA infection nor to guide or monitor treatment for MRSA infections. Performed at [facility], [place] [address], [place], [place] [address]   Culture, blood (routine x 2)     Status: None   Collection Time: 08/09/17  9:29 PM  Result Value Ref Range Status   Specimen Description   Final    BLOOD RIGHT ARM Performed at [place] [address] [place], [place] [address]    Special Requests   Final    Blood Culture adequate volume BOTTLES DRAWN AEROBIC AND ANAEROBIC Performed at [place] [address], [place], [place] [address]    Culture   Final    NO GROWTH 5 DAYS Performed at [facility], [place] [address] [place], [place] [address]    Report Status 08/15/2017 FINAL  Final  Culture, blood (routine x 2)     Status: None   Collection Time: 08/09/17 10:02 PM  Result Value Ref Range Status   Specimen Description   Final    BLOOD RIGHT ANTECUBITAL Performed at [place] [address], [place], [place] [address]    Special Requests   Final    BOTTLES DRAWN AEROBIC AND ANAEROBIC Blood Culture adequate volume Performed at [place] [address], [place], [place] [address]    Culture   Final    NO GROWTH 5 DAYS Performed at [facility], [place] [address] [place], [place] [address]    Report Status 08/15/2017 FINAL  Final  Culture, Urine     Status: None   Collection Time: 08/13/17  6:53 PM  Result Value Ref Range Status   Specimen Description   Final    URINE, CLEAN CATCH Performed at [facility], [place] [address], [place], [place] [address]    Special Requests   Final    NONE Performed at [facility] [place] [address], [place], [place] [address]    Culture   Final    NO GROWTH Performed at [facility], [place] [address] [place], [place] [address]    Report Status 08/15/2017 FINAL  Final     Labs: BNP (last 3 results) No results for input(s): BNP in the last 8760 hours. Basic Metabolic Panel: Recent Labs  Lab 08/10/17 0331 08/11/17 0336 08/12/17 0339 08/13/17 0523 08/14/17 0544  NA 142 143 141 139 139  K 4.2 4.0 3.6 3.6 3.6  CL 107 110 110 108 108  CO2 21* 20* 21* 21* 23  GLUCOSE 84 127* 128* 104* 92  BUN 23* 30* 45* 25* 18  CREATININE 0.94 1.06 1.74* 0.92 0.91  CALCIUM 9.0 8.9 8.5* 8.4* 8.3*   Liver Function Tests: Recent Labs  Lab  08/10/17 0331  AST 50*  ALT 29  ALKPHOS 68  BILITOT 1.0  PROT 7.8  ALBUMIN 3.3*   No results for input(s): LIPASE, AMYLASE in the last 168 hours. No results for input(s): AMMONIA in the last 168 hours. CBC: Recent Labs  Lab 08/10/17 0331 08/12/17 0339 08/14/17 0544  WBC 8.0 9.0 7.7  NEUTROABS  --   --  5.3  HGB 13.0 10.4* 9.4*  HCT 40.4 32.3* 28.3*  MCV 84.2 83.2 81.6  PLT 259 304 226   Cardiac Enzymes: Recent Labs  Lab 08/10/17 2102  CKTOTAL 308   BNP: Invalid input(s): POCBNP CBG: No results for input(s): GLUCAP in the last 168 hours. D-Dimer No results for input(s): DDIMER in the last 72 hours. Hgb A1c No results for input(s): HGBA1C in the last 72  hours. Lipid Profile No results for input(s): CHOL, HDL, LDLCALC, TRIG, CHOLHDL, LDLDIRECT in the last 72 hours. Thyroid function studies No results for input(s): TSH, T4TOTAL, T3FREE, THYROIDAB in the last 72 hours.  Invalid input(s): FREET3 Anemia work up No results for input(s): VITAMINB12, FOLATE, FERRITIN, TIBC, IRON, RETICCTPCT in the last 72 hours. Urinalysis    Component Value Date/Time   COLORURINE YELLOW 08/13/2017 1853   APPEARANCEUR CLEAR 08/13/2017 1853   LABSPEC 1.016 08/13/2017 1853   PHURINE 5.0 08/13/2017 1853   GLUCOSEU NEGATIVE 08/13/2017 1853   HGBUR NEGATIVE 08/13/2017 1853   BILIRUBINUR NEGATIVE 08/13/2017 1853   KETONESUR NEGATIVE 08/13/2017 1853   PROTEINUR NEGATIVE 08/13/2017 1853   UROBILINOGEN 1.0 08/10/2014 1653   NITRITE NEGATIVE 08/13/2017 1853   LEUKOCYTESUR NEGATIVE 08/13/2017 1853   Sepsis Labs Invalid input(s): PROCALCITONIN,  WBC,  LACTICIDVEN Microbiology Recent Results (from the past 240 hour(s))  Urine culture     Status: None   Collection Time: 08/08/17  9:34 PM  Result Value Ref Range Status   Specimen Description URINE, CATHETERIZED  Final   Special Requests NONE  Final   Culture   Final    NO GROWTH Performed at [facility], [place] [address] [place], [place] [address]    Report Status 08/10/2017 FINAL  Final  Respiratory Panel by PCR     Status: None   Collection Time: 08/09/17 12:34 PM  Result Value Ref Range Status   Adenovirus NOT DETECTED NOT DETECTED Final   Coronavirus 229E NOT DETECTED NOT DETECTED Final   Coronavirus HKU1 NOT DETECTED NOT DETECTED Final   Coronavirus NL63 NOT DETECTED NOT DETECTED Final   Coronavirus OC43 NOT DETECTED NOT DETECTED Final   Metapneumovirus NOT DETECTED NOT DETECTED Final   Rhinovirus / Enterovirus NOT DETECTED NOT DETECTED Final   Influenza A NOT DETECTED NOT DETECTED Final   Influenza B NOT DETECTED NOT DETECTED Final   Parainfluenza Virus 1 NOT DETECTED NOT DETECTED Final    Parainfluenza Virus 2 NOT DETECTED NOT DETECTED Final   Parainfluenza Virus 3 NOT DETECTED NOT DETECTED Final   Parainfluenza Virus 4 NOT DETECTED NOT DETECTED Final   Respiratory Syncytial Virus NOT DETECTED NOT DETECTED Final   Bordetella pertussis NOT DETECTED NOT DETECTED Final   Chlamydophila pneumoniae NOT DETECTED NOT DETECTED Final   Mycoplasma pneumoniae NOT DETECTED NOT DETECTED Final    Comment: Performed at [facility], [place] [address], [place], [place] [address]  Rapid strep screen     Status: None   Collection Time: 08/09/17  5:39 PM  Result Value Ref Range Status   Streptococcus, Group A Screen (Direct) NEGATIVE NEGATIVE Final  Comment: (NOTE) A Rapid Antigen test may result negative if the antigen level in the sample is below the detection level of this test. The FDA has not cleared this test as a stand-alone test therefore the rapid antigen negative result has reflexed to a Group A Strep culture. Performed at [facility], [place] [address] [place], [place] [address]   Culture, group A strep     Status: None   Collection Time: 08/09/17  5:39 PM  Result Value Ref Range Status   Specimen Description   Final    THROAT Performed at [place] [address] [place], [place] [address]    Special Requests   Final    NONE Reflexed from [id number] Performed at [facility], [place] [address] [place], [place] [address]    Culture   Final    NO GROUP A STREP (S.PYOGENES) ISOLATED Performed at [facility], [place] [address] [place], [place] [address]    Report Status 08/12/2017 FINAL  Final  MRSA PCR Screening     Status: None   Collection Time: 08/09/17  8:16 PM  Result Value Ref Range Status   MRSA by PCR NEGATIVE NEGATIVE Final    Comment:        The GeneXpert MRSA Assay (FDA approved for NASAL specimens only), is one component of a comprehensive MRSA colonization surveillance program. It is  not intended to diagnose MRSA infection nor to guide or monitor treatment for MRSA infections. Performed at [facility], [place] [address], [place], [place] [address]   Culture, blood (routine x 2)     Status: None   Collection Time: 08/09/17  9:29 PM  Result Value Ref Range Status   Specimen Description   Final    BLOOD RIGHT ARM Performed at [place] [address], [place], [place] [address]    Special Requests   Final    Blood Culture adequate volume BOTTLES DRAWN AEROBIC AND ANAEROBIC Performed at [place] [address] [place], [place] [address]    Culture   Final    NO GROWTH 5 DAYS Performed at [facility], [place] [address], [place], [place] [address]    Report Status 08/15/2017 FINAL  Final  Culture, blood (routine x 2)     Status: None   Collection Time: 08/09/17 10:02 PM  Result Value Ref Range Status   Specimen Description   Final    BLOOD RIGHT ANTECUBITAL Performed at [place] [address], [name], [place] [address]    Special Requests   Final    BOTTLES DRAWN AEROBIC AND ANAEROBIC Blood Culture adequate volume Performed at [place] [address], [place], [place] [address]    Culture   Final    NO GROWTH 5 DAYS Performed at [facility], [place] [address], [place], [place] [address]    Report Status 08/15/2017 FINAL  Final  Culture, Urine     Status: None   Collection Time: 08/13/17  6:53 PM  Result Value Ref Range Status   Specimen Description   Final    URINE, CLEAN CATCH Performed at [facility], [place] [address] [place], [place] [address]    Special Requests   Final    NONE Performed at [facility], [place] [address], [place], [place] [address]    Culture   Final    NO GROWTH  Performed at [facility], [place] [address], [place], [place] [address]    Report Status 08/15/2017 FINAL   Final     Time coordinating discharge: 35 minutes  SIGNED:  Chipper Oman, MD  [facility] 08/16/2017, 1:26 PM  Pager please text page via  [email or web address]  Note - This record has been created using [employer]. Chart creation errors have been sought, but may not always have been located. Such creation errors do not reflect on the standard of medical care.

## 2017-08-16 NOTE — Progress Notes (Signed)
Report given to [employer], RN at [facility]. Pt will be transported via [facility].

## 2017-08-16 NOTE — Progress Notes (Signed)
Clinical Social Worker facilitated patient discharge including contacting patient family and facility to confirm patient discharge plans.  Clinical information faxed to facility and family agreeable with plan.  CSW arranged ambulance transport via [facility] to [place] .  RN to call [phone] (pt will go in room 107) for report prior to discharge.  Clinical Social Worker will sign off for now as social work intervention is no longer needed. Please consult [place] again if new need arises.  Rhea Pink, MSW, [place]

## 2017-08-16 NOTE — Clinical Social Work Placement (Signed)
   CLINICAL SOCIAL WORK PLACEMENT  NOTE  Date:  08/16/2017  Patient Details  Name: Jonathon [name] MRN: [id number] Date of Birth: 1936/04/24  Clinical Social Work is seeking post-discharge placement for this patient at the   level of care (*CSW will initial, date and re-position this form in  chart as items are completed):      Patient/family provided with [place] Work Department's list of facilities offering this level of care within the geographic area requested by the patient (or if unable, by the patient's family).      Patient/family informed of their freedom to choose among providers that offer the needed level of care, that participate in Medicare, Medicaid or managed care program needed by the patient, have an available bed and are willing to accept the patient.      Patient/family informed of [facility]'s ownership interest in [facility] and [facility], as well as of the fact that they are under no obligation to receive care at these facilities.  PASRR submitted to EDS on       PASRR number received on       Existing PASRR number confirmed on 08/16/17     FL2 transmitted to all facilities in geographic area requested by pt/family on 08/16/17     FL2 transmitted to all facilities within larger geographic area on       Patient informed that his/her managed care company has contracts with or will negotiate with certain facilities, including the following:            Patient/family informed of bed offers received.  Patient chooses bed at [facility] recommends and patient chooses bed at      Patient to be transferred to [facility] on 08/16/17.  Patient to be transferred to facility by [facility]     Patient family notified on 08/16/17 of transfer.  Name of family member notified:  spoke to [name] (daughter)      PHYSICIAN       Additional Comment:     _______________________________________________ Wende Neighbors, LCSW 08/16/2017, 12:44 PM

## 2017-08-18 ENCOUNTER — Non-Acute Institutional Stay (SKILLED_NURSING_FACILITY): Payer: Medicare Other | Admitting: Adult Health

## 2017-08-18 ENCOUNTER — Encounter: Payer: Self-pay | Admitting: Adult Health

## 2017-08-18 DIAGNOSIS — G9341 Metabolic encephalopathy: Secondary | ICD-10-CM

## 2017-08-18 DIAGNOSIS — F0390 Unspecified dementia without behavioral disturbance: Secondary | ICD-10-CM | POA: Diagnosis not present

## 2017-08-18 DIAGNOSIS — N179 Acute kidney failure, unspecified: Secondary | ICD-10-CM

## 2017-08-18 DIAGNOSIS — I10 Essential (primary) hypertension: Secondary | ICD-10-CM | POA: Diagnosis not present

## 2017-08-18 DIAGNOSIS — N401 Enlarged prostate with lower urinary tract symptoms: Secondary | ICD-10-CM

## 2017-08-18 DIAGNOSIS — E538 Deficiency of other specified B group vitamins: Secondary | ICD-10-CM

## 2017-08-18 DIAGNOSIS — I5032 Chronic diastolic (congestive) heart failure: Secondary | ICD-10-CM

## 2017-08-18 DIAGNOSIS — L03114 Cellulitis of left upper limb: Secondary | ICD-10-CM | POA: Diagnosis not present

## 2017-08-18 DIAGNOSIS — I471 Supraventricular tachycardia, unspecified: Secondary | ICD-10-CM

## 2017-08-18 DIAGNOSIS — R338 Other retention of urine: Secondary | ICD-10-CM

## 2017-08-18 DIAGNOSIS — G6181 Chronic inflammatory demyelinating polyneuritis: Secondary | ICD-10-CM | POA: Diagnosis not present

## 2017-08-18 NOTE — Progress Notes (Signed)
Location:  [facility] Room Number: 107-A Place of Service:  SNF (31) Provider:  Durenda Age, NP  Patient Care Team: Marletta Lor, MD as PCP - General [name], MD (Cardiology) [name], MD (Cardiology)  Extended Emergency Contact Information Primary Emergency Contact: [employer] Address: [address]          [place], [place] [address] Johnnette Litter of [place] Phone: [phone] Relation: Daughter Secondary Emergency Contact: [place] of [place] Phone: [phone] Relation: Niece  Code Status:  Full Code  Goals of care: Advanced Directive information Advanced Directives 08/09/2017  Does Patient Have a Medical Advance Directive? Yes  Type of Paramedic of [place];Living will  Does patient want to make changes to medical advance directive? No - Patient declined  Copy of [place] in Chart? Yes  Would patient like information on creating a medical advance directive? -  Pre-existing out of facility DNR order (yellow form or pink MOST form) -     Chief Complaint  Patient presents with  . 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 male seen today for hospital followup.  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  He has a PMH of chronic diastolic CHF with pacemaker, nephrolithiasis, asthma, and chronic inflammatory demyelinating polyneuropathy. It has been recommended not to restart Lexapro, Trazodone, or Zyprexa. He had fever which was thought to be due to serotonin syndrome, then thought of cellulitis of left arm. He was discharged with Keflex x 5 days.UA was unremarkable, chest x-ray is  clear, and blood cultures are negative. His right upper extremity was swollen. Doppler ultrasound showed superficial vein thrombosis. He was given antibiotics and now discharged on Keflex X 5 days. He was seen in the room today. No erythema noted nor edema on both arms. He was noted to be talking to somebody when I entered the room. He said that he "has been talked to and was told and its the tone of voice." He was even talking to somebody while I was talking to him. He was seen talking to the television. Charge nurse reported that he has refused his food during breakfast.    Past Medical History:  Diagnosis Date  . Asthma   . BENIGN PROSTATIC HYPERTROPHY, HX OF 10/25/2008  . BRADYCARDIA 2005  . Chronic diastolic congestive heart failure ([place]) 06/05/2016  . CIDP (chronic inflammatory demyelinating polyneuropathy) ([place])   . HYPERTENSION 11/21/2006  . Iron deficiency anemia, unspecified 04/19/2013  . 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 PACEMAKER, PERMANENT 2005   Gen change 2014 Medtronic Adaptic L dual-chamber pacemaker, serial [id number]   . SVT (supraventricular tachycardia) ([place])   . TOBACCO ABUSE 10/25/2008   Quit 2012   Past Surgical History:  Procedure Laterality Date  . COLONOSCOPY  [id number]  . PACEMAKER INSERTION  2005  . PERMANENT PACEMAKER GENERATOR CHANGE N/A 06/19/2012   Procedure: PERMANENT PACEMAKER GENERATOR CHANGE;  Surgeon: [name], MD; Medtronic Adaptic L dual-chamber pacemaker, serial #[id number]    . SKIN GRAFT Right 1962   wrist    No Known Allergies  Outpatient Encounter Medications as of 08/18/2017  Medication Sig  . aspirin EC 81 MG EC tablet Take 1  tablet (81 mg total) by mouth daily.  . cephALEXin (KEFLEX) 500 MG capsule Take 1 capsule (500 mg total) by mouth every 12 (twelve) hours for 3 days.  . Cholecalciferol (VITAMIN D3) 50000 units CAPS Take 1 capsule by mouth once a week.  . flecainide (TAMBOCOR) 150 MG tablet TAKE 1/2 TABLET BY MOUTH  TWICE DAILY  . furosemide (LASIX) 20 MG tablet Take 1 tablet (20 mg total) by mouth daily.  . Garlic 9629 MG CAPS Take 2,000 mg by mouth daily.   . irbesartan (AVAPRO) 300 MG tablet TAKE 1 TABLET(300 MG) BY MOUTH DAILY  . Melatonin 3 MG TABS Take 1 tablet (3 mg total) by mouth at bedtime.  . memantine (NAMENDA) 5 MG tablet Take 1 tablet (5 mg total) by mouth 2 (two) times daily.  . metoprolol tartrate (LOPRESSOR) 25 MG tablet Take 1 tablet (25 mg total) by mouth 2 (two) times daily.  Marland Kitchen NUTRITIONAL SUPPLEMENT LIQD Take 120 mLs by mouth 2 (two) times daily.  . polyethylene glycol (MIRALAX / GLYCOLAX) packet Take 17 g by mouth daily.  . tamsulosin (FLOMAX) 0.4 MG CAPS capsule Take 2 capsules (0.8 mg total) by mouth daily.  . vitamin B-12 (CYANOCOBALAMIN) 1000 MCG tablet Take 1,000 mcg by mouth daily.  . [DISCONTINUED] feeding supplement, ENSURE ENLIVE, (ENSURE ENLIVE) LIQD Take 237 mLs by mouth 2 (two) times daily between meals.  . [DISCONTINUED] Vitamin D, Ergocalciferol, (DRISDOL) 50000 units CAPS capsule Take 1 capsule (50,000 Units total) by mouth every 7 (seven) days.   No facility-administered encounter medications on file as of 08/18/2017.     Review of Systems  Unable to obtain due to dementia    Immunization History  Administered Date(s) Administered  . Influenza Whole 02/10/2010  . Influenza, High Dose Seasonal PF 01/30/2015, 04/30/2016, 01/28/2017  . Influenza,inj,Quad PF,6+ Mos 01/19/2013  . Influenza-Unspecified 02/03/2014  . PPD Test 01/28/2017  . Pneumococcal Conjugate-13 08/08/2014  . Pneumococcal Polysaccharide-23 01/14/2013  . Tetanus 01/14/2013   Pertinent  Health Maintenance Due  Topic Date Due  . INFLUENZA VACCINE  12/11/2017  . PNA vac Low Risk Adult  Completed   Fall Risk  05/27/2017 01/08/2017 01/06/2017 01/06/2017 12/09/2016  Falls in the past year? Yes Yes Yes Yes Yes  Comment - - - - -  Number falls in past yr: 1 2 or more 2 or more 2 or more 1  Injury with  Fall? No No No No No  Risk Factor Category  - High Fall Risk - - -  Risk for fall due to : Impaired mobility;Impaired balance/gait Impaired balance/gait;Impaired mobility Impaired balance/gait;Other (Comment) - Impaired balance/gait;Impaired mobility  Risk for fall due to: Comment - - - - -  Follow up Falls evaluation completed;Education provided;Falls prevention discussed Falls evaluation completed;Education provided;Falls prevention discussed - - Falls evaluation completed;Education provided;Falls prevention discussed    Vitals:   08/18/17 1115  BP: (!) 146/75  Pulse: 88  Resp: 18  Temp: 99.1 F (37.3 C)  TempSrc: Oral  SpO2: 95%  Weight: 179 lb 14.4 oz (81.6 kg)  Height: 5\' 10"  (1.778 m)   Body mass index is 25.81 kg/m.  Physical Exam  GENERAL APPEARANCE: Well nourished. In no acute distress. Normal body habitus SKIN:  Skin is warm and dry.  MOUTH and THROAT: Lips are without lesions. Oral mucosa is moist and without lesions. Tongue is normal in shape, size, and color and without lesions RESPIRATORY: Breathing is even & unlabored, BS CTAB CARDIAC: RRR, no murmur,no extra  heart sounds, left ankle 1+ edema GI: Abdomen soft, normal BS, no masses, no tenderness GU:  Has foley catheter draining to urine bag with yellowish urine PSYCHIATRIC: Alert to self, disoriented to time and place. Talking to a person not in the room.   Labs reviewed: Recent Labs    01/02/17 0343  08/03/17 0436 08/05/17 0542  08/12/17 0339 08/13/17 0523 08/14/17 0544  NA 133*   < > 136 140   < > 141 139 139  K 3.5   < > 3.9 3.7   < > 3.6 3.6 3.6  CL 104   < > 102 105   < > 110 108 108  CO2 20*   < > 25 25   < > 21* 21* 23  GLUCOSE 87   < > 106* 98   < > 128* 104* 92  BUN 6   < > 16 20   < > 45* 25* 18  CREATININE 0.87   < > 0.95 1.27*   < > 1.74* 0.92 0.91  CALCIUM 8.2*   < > 9.0 8.6*   < > 8.5* 8.4* 8.3*  MG 1.6*  --  1.7 1.8  --   --   --   --    < > = values in this interval not displayed.    Recent Labs    08/08/17 1917 08/09/17 1234 08/10/17 0331  AST 90* 55* 50*  ALT 36 28 29  ALKPHOS 76 63 68  BILITOT 1.3* 1.2 1.0  PROT 8.1 7.2 7.8  ALBUMIN 3.3* 3.1* 3.3*   Recent Labs    08/01/17 1058  08/09/17 1234 08/10/17 0331 08/12/17 0339 08/14/17 0544  WBC 6.1   < > 7.9 8.0 9.0 7.7  NEUTROABS 3.1  --  5.2  --   --  5.3  HGB 12.8*   < > 11.3* 13.0 10.4* 9.4*  HCT 37.8*   < > 34.8* 40.4 32.3* 28.3*  MCV 80.8   < > 83.3 84.2 83.2 81.6  PLT 194   < > 254 259 304 226   < > = values in this interval not displayed.   Lab Results  Component Value Date   TSH 1.28 01/08/2017   Lab Results  Component Value Date   HGBA1C 6.0 11/05/2013   Lab Results  Component Value Date   CHOL 202 (H) 01/14/2013   HDL 53.60 01/14/2013   LDLDIRECT 126.6 01/14/2013   TRIG 206.0 (H) 01/14/2013   CHOLHDL 4 01/14/2013    Significant Diagnostic Results in last 30 days:  Dg Chest 1 View  Result Date: 08/08/2017 CLINICAL DATA:  Altered mental status EXAM: CHEST  1 VIEW COMPARISON:  08/01/2017 FINDINGS: Left-sided pacing device as before. No consolidation or effusion. Stable cardiomediastinal silhouette. No pneumothorax. IMPRESSION: No active disease. Electronically Signed   By: Donavan Foil M.D.   On: 08/08/2017 20:10   Dg Chest 2 View  Result Date: 08/09/2017 CLINICAL DATA:  Fever. EXAM: CHEST - 2 VIEW COMPARISON:  08/08/2017 FINDINGS: Left chest wall pacer device is noted with lead in the right atrial appendage and right ventricle. The heart size appears normal. There is no pleural effusion or edema. No airspace opacities. IMPRESSION: 1. No acute cardiopulmonary abnormalities. Electronically Signed   By: Kerby Moors M.D.   On: 08/09/2017 14:48   Dg Chest 2 View  Result Date: 08/01/2017 CLINICAL DATA:  Altered mental status. EXAM: CHEST - 2 VIEW COMPARISON:  12/28/2016 FINDINGS: Dual lead cardiac pacemaker in  stable position. Cardiomediastinal silhouette is normal. Mediastinal  contours appear intact. Tortuosity and calcific atherosclerotic disease of the aorta. There is no evidence of focal airspace consolidation, pleural effusion or pneumothorax. Osseous structures are without acute abnormality. Soft tissues are grossly normal. IMPRESSION: No active cardiopulmonary disease. Electronically Signed   By: Fidela Salisbury M.D.   On: 08/01/2017 11:00   Ct Head Wo Contrast  Result Date: 08/08/2017 CLINICAL DATA:  Altered mental status.  Found down. EXAM: CT HEAD WITHOUT CONTRAST TECHNIQUE: Contiguous axial images were obtained from the base of the skull through the vertex without intravenous contrast. COMPARISON:  08/01/2017 FINDINGS: Brain: No mass lesion, intraparenchymal hemorrhage or extra-axial collection. No evidence of acute cortical infarct. Normal appearance of the brain parenchyma and extra axial spaces for age. Vascular: No hyperdense vessel or unexpected vascular calcification. Skull: Normal visualized skull base, calvarium and extracranial soft tissues. Sinuses/Orbits: No sinus fluid levels or advanced mucosal thickening. No mastoid effusion. Chronic fracture of the right lamina papyracea. IMPRESSION: Normal aging brain. Electronically Signed   By: Ulyses Jarred M.D.   On: 08/08/2017 20:08   Ct Head Wo Contrast  Result Date: 08/01/2017 CLINICAL DATA:  Seizure.  Altered mental status. EXAM: CT HEAD WITHOUT CONTRAST TECHNIQUE: Contiguous axial images were obtained from the base of the skull through the vertex without intravenous contrast. COMPARISON:  12/28/2016 FINDINGS: Brain: Ventricles, cisterns and other CSF spaces are normal. There is mild chronic ischemic microvascular disease. There is no mass, mass effect, shift of midline structures or acute hemorrhage. There is no evidence of acute infarction. Vascular: No hyperdense vessel or unexpected calcification. Skull: Normal. Negative for fracture or focal lesion. Sinuses/Orbits: No acute finding. Other: None.  IMPRESSION: No acute findings. Mild chronic ischemic microvascular disease. Electronically Signed   By: Marin Olp M.D.   On: 08/01/2017 16:10    Assessment/Plan  1. Metabolic encephalopathy - thought to be related to serotonin syndrome, non infectious process, neurology was consulted and recommended Ativan which was tapered off, recommended  not to restart Lexapro, Trazodone or Zyprexa, for rehabilitation, PT and OT, for therapeutic strengthening exercises, fall precautions   2. Left arm cellulitis - will continue Cephalexin 500 mg BID BID X 3 days, no edema nor erythema noted   3. Chronic diastolic congestive heart failure (HCC) - no SOB, continue Lasix 20 mg daily,  Irbesartan 300 mg 1 tab daily, metoprolol tartrate 5 mg 1 table   4. Essential hypertension - continueI  Irbesartan 300 mg 1 tab daily   5. Sustained SVT (HCC)  -rate controlled, continue flecainide150 mg give 1/2 tab twice a day   6. Benign prostatic hyperplasia with urinary retention - has Foley catheter, attempted X2 failed voiding trialfollow-up with urology, continue Flomax 0.4 mg give 2 capsules = 0.8 mg daily   7. B12 deficiency - continue vitamin B12 1000  mcg 1 tab daily and metoprolol tartrate  1Tab twice a day   8. Dementia without behavioral disturbance, unspecified dementia type - continue memantine 5 mg 1 tab twice a day   9. CIDP - on IVIG prior to hospital admission, last dose 3/22  10.  Acute renal failure -  will check BMP in 1 week Lab Results  Component Value Date   CREATININE 0.91 08/14/2017      Family/ staff Communication: Discussed plan of care with charge nurse.  Labs/tests ordered:  CBC and BMP in 1 week  Goals of care:   Short-term care   Durenda Age, NP [place]  [facility] [phone] (Monday-Friday 8:00 a.m. - 5:00 p.m.) [phone] (after hours)

## 2017-08-19 ENCOUNTER — Encounter: Payer: Self-pay | Admitting: Internal Medicine

## 2017-08-19 ENCOUNTER — Non-Acute Institutional Stay (SKILLED_NURSING_FACILITY): Payer: Medicare Other | Admitting: Internal Medicine

## 2017-08-19 DIAGNOSIS — I82890 Acute embolism and thrombosis of other specified veins: Secondary | ICD-10-CM | POA: Diagnosis not present

## 2017-08-19 DIAGNOSIS — G9340 Encephalopathy, unspecified: Secondary | ICD-10-CM | POA: Diagnosis not present

## 2017-08-19 DIAGNOSIS — N401 Enlarged prostate with lower urinary tract symptoms: Secondary | ICD-10-CM

## 2017-08-19 DIAGNOSIS — R338 Other retention of urine: Secondary | ICD-10-CM | POA: Diagnosis not present

## 2017-08-19 NOTE — Assessment & Plan Note (Signed)
Urology follow-up as outpatient

## 2017-08-19 NOTE — Patient Instructions (Signed)
See assessment and plan under each diagnosis in the problem list and acutely for this visit 

## 2017-08-19 NOTE — Progress Notes (Signed)
NURSING HOME LOCATION:  [facility] ROOM NUMBER:  107-A  CODE STATUS:  Full Code  PCP:  Marletta Lor, MD  [place] [address]   This is a comprehensive admission note to [facility] performed on this date less than 30 days from date of admission. Included are preadmission medical/surgical history;reconciled medication list; family history; social history and comprehensive review of systems.  Corrections and additions to the records were documented. Comprehensive physical exam was also performed. Additionally a clinical summary was entered for each active diagnosis pertinent to this admission in the Problem List to enhance continuity of care.  HPI: The patient was hospitalized 3/30-08/16/17 presenting with changes of mental status in the context of fever. Working diagnosis was possible serotonin syndrome as there was no evidence of any infectious process on urinalysis, chest x-ray, or subsequently blood cultures.  Mental status changes were manifested as intermittent confusion with periods of lucency. Neurology recommended Ativan 2 mg every 6 hours with subsequent taper. Psychiatry recommended not restarting Lexapro, trazodone, Zyprexa.  PT recommended SNF for rehabilitation. Cellulitis of the right upper extremity was found and Keflex prescribed for 5 days. Doppler ultrasound revealed superficial vein thrombosis for which warm compresses ,aspirin and elevation of the extremity was prescribed.  Patient did have urinary retention in the context of history of BPH. Flomax was increased to 0.8 mg and Foley kept in place. Urology follow-up as an outpatient was to be scheduled. Lab abnormalities prior to discharge included a calcium of 8.3, hemoglobin 9.4, and hematocrit 28.3. Indices were normochromic, normocytic.  Past medical and surgical history: Includes SVT, nephrolithiasis, chronic inflammatory demyelinating polyneuropathy, chronic diastolic  congestive heart failure, and asthma. Surgeries and procedures include permanent pacemaker insertion. He also had colonoscopy twice.  Social history: former smoker, record indicates he drinks 4 beers per day  Family history:reviewed   Review of systems: He gave the date as April or May 2019. His only complaint was numbness in his toes which is chronic. He states that he is short of breath only with asthma flares. Actually his history tended to be rambling, convoluted & unfocused. Responses were slow.  Constitutional: No fever,significant weight change, fatigue  Eyes: No redness, discharge, pain, vision change ENT/mouth: No nasal congestion, purulent discharge, earache, change in hearing, sore throat  Cardiovascular: No chest pain, palpitations, paroxysmal nocturnal dyspnea, claudication, edema  Respiratory: No cough, sputum production, hemoptysis, DOE, significant snoring, apnea Gastrointestinal: No heartburn, dysphagia, abdominal pain, nausea /vomiting, rectal bleeding, melena, change in bowels Genitourinary: No dysuria, hematuria, pyuria, incontinence, nocturia Musculoskeletal: No joint stiffness, joint swelling, weakness, pain Dermatologic: No rash, pruritus, change in appearance of skin Neurologic: No dizziness, headache, syncope, seizures Psychiatric: No significant anxiety, depression, insomnia, anorexia Endocrine: No change in hair/skin/ nails, excessive thirst, excessive hunger, excessive urination  Hematologic/lymphatic: No significant bruising, lymphadenopathy, abnormal bleeding Allergy/immunology: No itchy/watery eyes, significant sneezing, urticaria, angioedema  Physical exam:  Pertinent or positive findings: Hair is disheveled. 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 Ptosis greater on the left than the right. There is minimal esotropia of the right eye. There are bronchovesicular breath sounds over the upper lobes. He has minor rales inferiorly. He has 1/2+ pitting edema. Pedal pulses  are surprisingly good. He is slightly weak to opposition. Foley catheter in place. There is an irregular dark nevus of the right upper forearm with 2 surgical deficits within it. Burn / graft scarring over the right ventral forearm.  General appearance: Adequately nourished; no  acute distress, increased work of breathing is present.   Lymphatic: No lymphadenopathy about the head, neck, axilla. Eyes: No conjunctival inflammation or lid edema is present. There is no scleral icterus. Ears:  External ear exam shows no significant lesions or deformities.   Nose:  External nasal examination shows no deformity or inflammation. Nasal mucosa are pink and moist without lesions, exudates Oral exam: Lips and gums are healthy appearing.There is no oropharyngeal erythema or exudate. Neck:  No thyromegaly, masses, tenderness noted.    Heart:  Normal rate and regular rhythm. S1 and S2 normal without gallop, murmur, click, rub .  Lungs: without wheezes, rhonchi, rubs. Abdomen: Bowel sounds are normal.  Abdomen is soft and nontender with no organomegaly, hernias, masses. GU: Deferred  Extremities:  No cyanosis, clubbing  Neurologic exam:  Strength equal  in upper & lower extremities Balance, Rhomberg, finger to nose testing could not be completed due to clinical state Deep tendon reflexes are equal Skin: Warm & dry w/o tenting. No significant rash.  See clinical summary under each active problem in the Problem List with associated updated therapeutic plan

## 2017-08-20 NOTE — Assessment & Plan Note (Signed)
Psych NP follow up @ SNF 

## 2017-08-20 NOTE — Assessment & Plan Note (Signed)
Clinically resolved.  

## 2017-08-23 LAB — CBC AND DIFFERENTIAL
HCT: 30 — AB (ref 41–53)
Hemoglobin: 9.9 — AB (ref 13.5–17.5)
NEUTROS ABS: 3
PLATELETS: 426 — AB (ref 150–399)
WBC: 5.5

## 2017-08-23 LAB — BASIC METABOLIC PANEL
BUN: 13 (ref 4–21)
Creatinine: 0.9 (ref 0.6–1.3)
Glucose: 80
Potassium: 4.5 (ref 3.4–5.3)
Sodium: 139 (ref 137–147)

## 2017-08-25 LAB — BASIC METABOLIC PANEL
BUN: 12 (ref 4–21)
Creatinine: 0.9 (ref 0.6–1.3)
GLUCOSE: 88
POTASSIUM: 4.4 (ref 3.4–5.3)
SODIUM: 139 (ref 137–147)

## 2017-08-25 LAB — CBC AND DIFFERENTIAL
HEMATOCRIT: 31 — AB (ref 41–53)
HEMOGLOBIN: 10.7 — AB (ref 13.5–17.5)
NEUTROS ABS: 2
Platelets: 404 — AB (ref 150–399)
WBC: 5.4

## 2017-08-28 ENCOUNTER — Ambulatory Visit: Payer: Medicare Other | Admitting: Internal Medicine

## 2017-09-04 DIAGNOSIS — N401 Enlarged prostate with lower urinary tract symptoms: Secondary | ICD-10-CM | POA: Diagnosis not present

## 2017-09-04 DIAGNOSIS — R338 Other retention of urine: Secondary | ICD-10-CM | POA: Diagnosis not present

## 2017-09-04 DIAGNOSIS — R351 Nocturia: Secondary | ICD-10-CM | POA: Diagnosis not present

## 2017-09-05 ENCOUNTER — Emergency Department (HOSPITAL_COMMUNITY): Payer: Medicare Other

## 2017-09-05 ENCOUNTER — Other Ambulatory Visit: Payer: Self-pay

## 2017-09-05 ENCOUNTER — Inpatient Hospital Stay (HOSPITAL_COMMUNITY)
Admission: EM | Admit: 2017-09-05 | Discharge: 2017-09-07 | DRG: 698 | Disposition: A | Discharge status: Skilled Nursing Facility | Payer: Medicare Other | Attending: Internal Medicine | Admitting: Internal Medicine

## 2017-09-05 ENCOUNTER — Encounter (HOSPITAL_COMMUNITY): Payer: Self-pay | Admitting: Emergency Medicine

## 2017-09-05 DIAGNOSIS — R509 Fever, unspecified: Secondary | ICD-10-CM

## 2017-09-05 DIAGNOSIS — Z820 Family history of epilepsy and other diseases of the nervous system: Secondary | ICD-10-CM

## 2017-09-05 DIAGNOSIS — N400 Enlarged prostate without lower urinary tract symptoms: Secondary | ICD-10-CM | POA: Diagnosis present

## 2017-09-05 DIAGNOSIS — S0181XA Laceration without foreign body of other part of head, initial encounter: Secondary | ICD-10-CM | POA: Diagnosis not present

## 2017-09-05 DIAGNOSIS — M6281 Muscle weakness (generalized): Secondary | ICD-10-CM | POA: Diagnosis not present

## 2017-09-05 DIAGNOSIS — I10 Essential (primary) hypertension: Secondary | ICD-10-CM | POA: Diagnosis not present

## 2017-09-05 DIAGNOSIS — Y846 Urinary catheterization as the cause of abnormal reaction of the patient, or of later complication, without mention of misadventure at the time of the procedure: Secondary | ICD-10-CM | POA: Diagnosis present

## 2017-09-05 DIAGNOSIS — Z950 Presence of cardiac pacemaker: Secondary | ICD-10-CM

## 2017-09-05 DIAGNOSIS — S0003XA Contusion of scalp, initial encounter: Secondary | ICD-10-CM | POA: Diagnosis present

## 2017-09-05 DIAGNOSIS — N401 Enlarged prostate with lower urinary tract symptoms: Secondary | ICD-10-CM | POA: Diagnosis not present

## 2017-09-05 DIAGNOSIS — Z8249 Family history of ischemic heart disease and other diseases of the circulatory system: Secondary | ICD-10-CM | POA: Diagnosis not present

## 2017-09-05 DIAGNOSIS — R488 Other symbolic dysfunctions: Secondary | ICD-10-CM | POA: Diagnosis not present

## 2017-09-05 DIAGNOSIS — R2681 Unsteadiness on feet: Secondary | ICD-10-CM | POA: Diagnosis not present

## 2017-09-05 DIAGNOSIS — W19XXXA Unspecified fall, initial encounter: Secondary | ICD-10-CM | POA: Diagnosis present

## 2017-09-05 DIAGNOSIS — J45909 Unspecified asthma, uncomplicated: Secondary | ICD-10-CM | POA: Diagnosis not present

## 2017-09-05 DIAGNOSIS — R339 Retention of urine, unspecified: Secondary | ICD-10-CM | POA: Diagnosis present

## 2017-09-05 DIAGNOSIS — R296 Repeated falls: Secondary | ICD-10-CM | POA: Diagnosis present

## 2017-09-05 DIAGNOSIS — S79912A Unspecified injury of left hip, initial encounter: Secondary | ICD-10-CM | POA: Diagnosis not present

## 2017-09-05 DIAGNOSIS — S62202A Unspecified fracture of first metacarpal bone, left hand, initial encounter for closed fracture: Secondary | ICD-10-CM | POA: Diagnosis present

## 2017-09-05 DIAGNOSIS — S0081XA Abrasion of other part of head, initial encounter: Secondary | ICD-10-CM | POA: Diagnosis present

## 2017-09-05 DIAGNOSIS — G6181 Chronic inflammatory demyelinating polyneuritis: Secondary | ICD-10-CM | POA: Diagnosis present

## 2017-09-05 DIAGNOSIS — Z833 Family history of diabetes mellitus: Secondary | ICD-10-CM | POA: Diagnosis not present

## 2017-09-05 DIAGNOSIS — Z803 Family history of malignant neoplasm of breast: Secondary | ICD-10-CM

## 2017-09-05 DIAGNOSIS — A4189 Other specified sepsis: Secondary | ICD-10-CM | POA: Diagnosis not present

## 2017-09-05 DIAGNOSIS — W1830XA Fall on same level, unspecified, initial encounter: Secondary | ICD-10-CM | POA: Diagnosis present

## 2017-09-05 DIAGNOSIS — A4152 Sepsis due to Pseudomonas: Secondary | ICD-10-CM | POA: Diagnosis present

## 2017-09-05 DIAGNOSIS — Z87891 Personal history of nicotine dependence: Secondary | ICD-10-CM

## 2017-09-05 DIAGNOSIS — I5032 Chronic diastolic (congestive) heart failure: Secondary | ICD-10-CM | POA: Diagnosis present

## 2017-09-05 DIAGNOSIS — T83511A Infection and inflammatory reaction due to indwelling urethral catheter, initial encounter: Principal | ICD-10-CM | POA: Diagnosis present

## 2017-09-05 DIAGNOSIS — S62232A Other displaced fracture of base of first metacarpal bone, left hand, initial encounter for closed fracture: Secondary | ICD-10-CM | POA: Diagnosis not present

## 2017-09-05 DIAGNOSIS — S6990XA Unspecified injury of unspecified wrist, hand and finger(s), initial encounter: Secondary | ICD-10-CM | POA: Diagnosis not present

## 2017-09-05 DIAGNOSIS — Y92121 Bathroom in nursing home as the place of occurrence of the external cause: Secondary | ICD-10-CM | POA: Diagnosis not present

## 2017-09-05 DIAGNOSIS — I2510 Atherosclerotic heart disease of native coronary artery without angina pectoris: Secondary | ICD-10-CM | POA: Diagnosis present

## 2017-09-05 DIAGNOSIS — S62512A Displaced fracture of proximal phalanx of left thumb, initial encounter for closed fracture: Secondary | ICD-10-CM | POA: Diagnosis not present

## 2017-09-05 DIAGNOSIS — R300 Dysuria: Secondary | ICD-10-CM | POA: Diagnosis not present

## 2017-09-05 DIAGNOSIS — Z841 Family history of disorders of kidney and ureter: Secondary | ICD-10-CM

## 2017-09-05 DIAGNOSIS — E876 Hypokalemia: Secondary | ICD-10-CM | POA: Diagnosis not present

## 2017-09-05 DIAGNOSIS — I471 Supraventricular tachycardia: Secondary | ICD-10-CM | POA: Diagnosis present

## 2017-09-05 DIAGNOSIS — I5033 Acute on chronic diastolic (congestive) heart failure: Secondary | ICD-10-CM | POA: Diagnosis not present

## 2017-09-05 DIAGNOSIS — Z87442 Personal history of urinary calculi: Secondary | ICD-10-CM

## 2017-09-05 DIAGNOSIS — L03114 Cellulitis of left upper limb: Secondary | ICD-10-CM | POA: Diagnosis not present

## 2017-09-05 DIAGNOSIS — Z7982 Long term (current) use of aspirin: Secondary | ICD-10-CM | POA: Diagnosis not present

## 2017-09-05 DIAGNOSIS — N179 Acute kidney failure, unspecified: Secondary | ICD-10-CM | POA: Diagnosis present

## 2017-09-05 DIAGNOSIS — N3001 Acute cystitis with hematuria: Secondary | ICD-10-CM | POA: Diagnosis not present

## 2017-09-05 DIAGNOSIS — N390 Urinary tract infection, site not specified: Secondary | ICD-10-CM | POA: Diagnosis not present

## 2017-09-05 DIAGNOSIS — I110 Hypertensive heart disease with heart failure: Secondary | ICD-10-CM | POA: Diagnosis present

## 2017-09-05 DIAGNOSIS — S299XXA Unspecified injury of thorax, initial encounter: Secondary | ICD-10-CM | POA: Diagnosis not present

## 2017-09-05 DIAGNOSIS — M25552 Pain in left hip: Secondary | ICD-10-CM | POA: Diagnosis not present

## 2017-09-05 DIAGNOSIS — F0390 Unspecified dementia without behavioral disturbance: Secondary | ICD-10-CM | POA: Diagnosis present

## 2017-09-05 DIAGNOSIS — G92 Toxic encephalopathy: Secondary | ICD-10-CM | POA: Diagnosis not present

## 2017-09-05 DIAGNOSIS — R6520 Severe sepsis without septic shock: Secondary | ICD-10-CM | POA: Diagnosis not present

## 2017-09-05 DIAGNOSIS — Z9181 History of falling: Secondary | ICD-10-CM | POA: Diagnosis not present

## 2017-09-05 DIAGNOSIS — S62202D Unspecified fracture of first metacarpal bone, left hand, subsequent encounter for fracture with routine healing: Secondary | ICD-10-CM | POA: Diagnosis not present

## 2017-09-05 DIAGNOSIS — S098XXA Other specified injuries of head, initial encounter: Secondary | ICD-10-CM | POA: Diagnosis not present

## 2017-09-05 DIAGNOSIS — R338 Other retention of urine: Secondary | ICD-10-CM | POA: Diagnosis not present

## 2017-09-05 DIAGNOSIS — I82611 Acute embolism and thrombosis of superficial veins of right upper extremity: Secondary | ICD-10-CM | POA: Diagnosis not present

## 2017-09-05 DIAGNOSIS — A419 Sepsis, unspecified organism: Secondary | ICD-10-CM

## 2017-09-05 DIAGNOSIS — Z466 Encounter for fitting and adjustment of urinary device: Secondary | ICD-10-CM | POA: Diagnosis not present

## 2017-09-05 DIAGNOSIS — B965 Pseudomonas (aeruginosa) (mallei) (pseudomallei) as the cause of diseases classified elsewhere: Secondary | ICD-10-CM | POA: Diagnosis not present

## 2017-09-05 LAB — CBC WITH DIFFERENTIAL/PLATELET
BASOS PCT: 0 %
Basophils Absolute: 0 10*3/uL (ref 0.0–0.1)
Eosinophils Absolute: 0 10*3/uL (ref 0.0–0.7)
Eosinophils Relative: 0 %
HEMATOCRIT: 34.5 % — AB (ref 39.0–52.0)
HEMOGLOBIN: 11.4 g/dL — AB (ref 13.0–17.0)
LYMPHS PCT: 4 %
Lymphs Abs: 0.6 10*3/uL — ABNORMAL LOW (ref 0.7–4.0)
MCH: 26.6 pg (ref 26.0–34.0)
MCHC: 33 g/dL (ref 30.0–36.0)
MCV: 80.4 fL (ref 78.0–100.0)
MONO ABS: 1.8 10*3/uL — AB (ref 0.1–1.0)
Monocytes Relative: 13 %
NEUTROS ABS: 11.8 10*3/uL — AB (ref 1.7–7.7)
Neutrophils Relative %: 83 %
Platelets: 206 10*3/uL (ref 150–400)
RBC: 4.29 MIL/uL (ref 4.22–5.81)
RDW: 14 % (ref 11.5–15.5)
WBC: 14.2 10*3/uL — ABNORMAL HIGH (ref 4.0–10.5)

## 2017-09-05 LAB — URINALYSIS, ROUTINE W REFLEX MICROSCOPIC
Bilirubin Urine: NEGATIVE
Glucose, UA: NEGATIVE mg/dL
Ketones, ur: NEGATIVE mg/dL
Nitrite: POSITIVE — AB
PROTEIN: 100 mg/dL — AB
SPECIFIC GRAVITY, URINE: 1.008 (ref 1.005–1.030)
pH: 6 (ref 5.0–8.0)

## 2017-09-05 LAB — COMPREHENSIVE METABOLIC PANEL
ALBUMIN: 2.9 g/dL — AB (ref 3.5–5.0)
ALT: 25 U/L (ref 17–63)
AST: 38 U/L (ref 15–41)
Alkaline Phosphatase: 87 U/L (ref 38–126)
Anion gap: 13 (ref 5–15)
BILIRUBIN TOTAL: 1.5 mg/dL — AB (ref 0.3–1.2)
BUN: 14 mg/dL (ref 6–20)
CHLORIDE: 99 mmol/L — AB (ref 101–111)
CO2: 22 mmol/L (ref 22–32)
CREATININE: 1.33 mg/dL — AB (ref 0.61–1.24)
Calcium: 9 mg/dL (ref 8.9–10.3)
GFR calc Af Amer: 57 mL/min — ABNORMAL LOW (ref >60)
GFR calc non Af Amer: 49 mL/min — ABNORMAL LOW (ref >60)
GLUCOSE: 120 mg/dL — AB (ref 65–99)
POTASSIUM: 3.6 mmol/L (ref 3.5–5.1)
Sodium: 134 mmol/L — ABNORMAL LOW (ref 135–145)
TOTAL PROTEIN: 7.4 g/dL (ref 6.5–8.1)

## 2017-09-05 LAB — LACTIC ACID, PLASMA: Lactic Acid, Venous: 1.3 mmol/L (ref 0.5–1.9)

## 2017-09-05 MED ORDER — MEMANTINE HCL 10 MG PO TABS
5.0000 mg | ORAL_TABLET | Freq: Two times a day (BID) | ORAL | Status: DC
  Administered 2017-09-05 – 2017-09-07 (×4): 5 mg via ORAL
  Filled 2017-09-05 (×4): qty 1

## 2017-09-05 MED ORDER — SODIUM CHLORIDE 0.9 % IV SOLN
INTRAVENOUS | Status: DC
  Administered 2017-09-05 – 2017-09-06 (×2): via INTRAVENOUS

## 2017-09-05 MED ORDER — SODIUM CHLORIDE 0.9 % IV SOLN
1.0000 g | Freq: Once | INTRAVENOUS | Status: AC
  Administered 2017-09-05: 1 g via INTRAVENOUS
  Filled 2017-09-05: qty 10

## 2017-09-05 MED ORDER — ACETAMINOPHEN 325 MG PO TABS
650.0000 mg | ORAL_TABLET | Freq: Four times a day (QID) | ORAL | Status: DC | PRN
  Administered 2017-09-05: 650 mg via ORAL
  Filled 2017-09-05: qty 2

## 2017-09-05 MED ORDER — ACETAMINOPHEN 650 MG RE SUPP: 650.0000 mg | Freq: Four times a day (QID) | RECTAL | Status: DC | PRN

## 2017-09-05 MED ORDER — SODIUM CHLORIDE 0.9 % IV SOLN
1.0000 g | INTRAVENOUS | Status: DC
  Administered 2017-09-05 – 2017-09-06 (×2): 1 g via INTRAVENOUS
  Filled 2017-09-05 (×2): qty 10

## 2017-09-05 MED ORDER — ENOXAPARIN SODIUM 40 MG/0.4ML ~~LOC~~ SOLN
40.0000 mg | SUBCUTANEOUS | Status: DC
  Administered 2017-09-05 – 2017-09-06 (×2): 40 mg via SUBCUTANEOUS
  Filled 2017-09-05 (×2): qty 0.4

## 2017-09-05 MED ORDER — SENNOSIDES-DOCUSATE SODIUM 8.6-50 MG PO TABS: 1.0000 | ORAL_TABLET | Freq: Every evening | ORAL | Status: DC | PRN

## 2017-09-05 MED ORDER — ASPIRIN EC 81 MG PO TBEC
81.0000 mg | DELAYED_RELEASE_TABLET | Freq: Every day | ORAL | Status: DC
  Administered 2017-09-05 – 2017-09-07 (×3): 81 mg via ORAL
  Filled 2017-09-05 (×3): qty 1

## 2017-09-05 MED ORDER — ONDANSETRON HCL 4 MG/2ML IJ SOLN: 4.0000 mg | Freq: Four times a day (QID) | INTRAMUSCULAR | Status: DC | PRN

## 2017-09-05 MED ORDER — IRBESARTAN 300 MG PO TABS
300.0000 mg | ORAL_TABLET | Freq: Every day | ORAL | Status: DC
  Administered 2017-09-05 – 2017-09-07 (×3): 300 mg via ORAL
  Filled 2017-09-05 (×3): qty 1

## 2017-09-05 MED ORDER — TAMSULOSIN HCL 0.4 MG PO CAPS
0.8000 mg | ORAL_CAPSULE | Freq: Every day | ORAL | Status: DC
  Administered 2017-09-05 – 2017-09-07 (×3): 0.8 mg via ORAL
  Filled 2017-09-05 (×3): qty 2

## 2017-09-05 MED ORDER — SODIUM CHLORIDE 0.9 % IV BOLUS
500.0000 mL | Freq: Once | INTRAVENOUS | Status: AC
  Administered 2017-09-05: 500 mL via INTRAVENOUS

## 2017-09-05 MED ORDER — METOPROLOL TARTRATE 25 MG PO TABS
25.0000 mg | ORAL_TABLET | Freq: Two times a day (BID) | ORAL | Status: DC
  Administered 2017-09-05 – 2017-09-07 (×4): 25 mg via ORAL
  Filled 2017-09-05 (×5): qty 1

## 2017-09-05 MED ORDER — ONDANSETRON HCL 4 MG PO TABS: 4.0000 mg | ORAL_TABLET | Freq: Four times a day (QID) | ORAL | Status: DC | PRN

## 2017-09-05 MED ORDER — SODIUM CHLORIDE 0.9 % IV SOLN
INTRAVENOUS | Status: DC
  Administered 2017-09-05: 18:00:00 via INTRAVENOUS

## 2017-09-05 MED ORDER — FUROSEMIDE 20 MG PO TABS
20.0000 mg | ORAL_TABLET | Freq: Every day | ORAL | Status: DC
  Administered 2017-09-05 – 2017-09-07 (×3): 20 mg via ORAL
  Filled 2017-09-05 (×3): qty 1

## 2017-09-05 MED ORDER — MELATONIN 3 MG PO TABS
3.0000 mg | ORAL_TABLET | Freq: Every day | ORAL | Status: DC
  Administered 2017-09-05 – 2017-09-06 (×2): 3 mg via ORAL
  Filled 2017-09-05 (×3): qty 1

## 2017-09-05 MED ORDER — HYDROCODONE-ACETAMINOPHEN 5-325 MG PO TABS
1.0000 | ORAL_TABLET | ORAL | Status: DC | PRN
  Administered 2017-09-06 (×2): 2 via ORAL
  Filled 2017-09-05 (×2): qty 2

## 2017-09-05 MED ORDER — FLECAINIDE ACETATE 50 MG PO TABS
75.0000 mg | ORAL_TABLET | Freq: Two times a day (BID) | ORAL | Status: DC
  Administered 2017-09-06 – 2017-09-07 (×4): 75 mg via ORAL
  Filled 2017-09-05 (×6): qty 2

## 2017-09-05 NOTE — ED Notes (Signed)
Will move to next available room for Foley & EKG, Charge RN aware.

## 2017-09-05 NOTE — ED Notes (Signed)
Pt's family to the desk requesting blankets, family informed by RN that pt has an elevated temperature and it's not advised for him to have multiple blankets at this time. Family stated "this is my dad and I'm not going to tell him no". Family seen getting blankets out of blanket warmer.

## 2017-09-05 NOTE — ED Provider Notes (Signed)
[facility] EMERGENCY DEPARTMENT Provider Note   CSN: [id number] Arrival date & time: 09/05/17  1010     History   Chief Complaint Chief Complaint  Patient presents with  . Fall    HPI Jonathon [name] is a 82 y.o. male.  Patient sent in from [facility] [place].  Patient was due for discharge today he was there for rehab for frequent falls.  He had a fall in the bathroom onto his left side.  Resulting in hitting his head with an abrasion to his forehead.  Complaint of left hip pain and left hand pain.  Patient also had had some urinary retention has Foley catheter removed yesterday.  In preparation for going home.  Patient states he has not really urinated since that time.  Patient states he has a fullness in his abdomen.     Past Medical History:  Diagnosis Date  . Asthma   . BENIGN PROSTATIC HYPERTROPHY, HX OF 10/25/2008  . BRADYCARDIA 2005  . Chronic diastolic congestive heart failure ([place]) 06/05/2016  . CIDP (chronic inflammatory demyelinating polyneuropathy) ([place])   . HYPERTENSION 11/21/2006  . Iron deficiency anemia, unspecified 04/19/2013  . 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 PACEMAKER, PERMANENT 2005   Gen change 2014 Medtronic Adaptic L dual-chamber pacemaker, serial [id number]   . SVT (supraventricular tachycardia) ([place])   . TOBACCO ABUSE 10/25/2008   Quit 2012    Patient Active Problem List   Diagnosis Date Noted  . Fall 09/05/2017  . Superficial vein thrombosis 08/19/2017  . Altered mental status   . Metabolic encephalopathy 86/57/8469  . Dementia 08/04/2017  . Seizure ([place]) 08/01/2017  . Thiamine deficiency 05/27/2017  . B12 deficiency 04/30/2017  . Alcoholism ([place]) 01/28/2017  . Acute encephalopathy 12/28/2016  . Hepatic steatosis 06/05/2016  . Physical deconditioning 06/05/2016  . Chronic diastolic congestive heart failure ([place]) 06/05/2016  . Orthostatic syncope   . Elevated liver function tests   . LOC (loss of consciousness)  ([place]) 08/10/2014  . CIDP (chronic inflammatory demyelinating polyneuropathy) ([place]) 08/08/2014  . Sustained SVT ([place]) 07/27/2014  . 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 Abnormal LFTs 08/06/2013  . Iron deficiency anemia 04/19/2013  . Muscle weakness 01/13/2013  . External hemorrhoids 03/23/2011  . Symptomatic bradycardia 10/25/2008  . 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 PACEMAKER, PERMANENT 10/25/2008  . Essential hypertension 11/21/2006  . BPH (benign prostatic hyperplasia) 11/21/2006  . History of colonic polyps 11/21/2006    Past Surgical History:  Procedure Laterality Date  . COLONOSCOPY  [id number]  . PACEMAKER INSERTION  2005  . PERMANENT PACEMAKER GENERATOR CHANGE N/A 06/19/2012   Procedure: PERMANENT PACEMAKER GENERATOR CHANGE;  Surgeon: [name], MD; Medtronic Adaptic L dual-chamber pacemaker, serial #[id number]    . SKIN GRAFT Right 1962   wrist        Home Medications    Prior to Admission medications   Medication Sig Start Date End Date Taking? Authorizing Provider  aspirin EC 81 MG EC tablet Take 1 tablet (81 mg total) by mouth daily. 08/17/17  Yes Patrecia Pour, Christean Grief, MD  Cholecalciferol (VITAMIN D3) 50000 units CAPS Take 1 capsule by mouth once a week.   Yes [provider]  flecainide (TAMBOCOR) 150 MG tablet TAKE 1/2 TABLET BY MOUTH TWICE DAILY 07/23/17  Yes [name], MD  furosemide (LASIX) 20 MG tablet Take 1 tablet (20 mg total) by mouth daily. 04/30/17  Yes Marletta Lor, MD  Garlic 2440 MG CAPS Take 2,000 mg by mouth  daily.    Yes [provider]  irbesartan (AVAPRO) 300 MG tablet TAKE 1 TABLET(300 MG) BY MOUTH DAILY 03/18/17  Yes [name], MD  memantine (NAMENDA) 5 MG tablet Take 1 tablet (5 mg total) by mouth 2 (two) times daily. 08/16/17  Yes Patrecia Pour, Christean Grief, MD  metoprolol tartrate (LOPRESSOR) 25 MG tablet Take 1 tablet (25 mg total) by mouth 2 (two) times daily. 08/16/17  Yes Doreatha Lew, MD  NUTRITIONAL  SUPPLEMENT LIQD Take 120 mLs by mouth 2 (two) times daily. [employer]   Yes [provider]  polyethylene glycol (MIRALAX / GLYCOLAX) packet Take 17 g by mouth daily. 08/17/17  Yes Patrecia Pour, Christean Grief, MD  tamsulosin (FLOMAX) 0.4 MG CAPS capsule Take 2 capsules (0.8 mg total) by mouth daily. 08/16/17  Yes Patrecia Pour, Christean Grief, MD  vitamin B-12 (CYANOCOBALAMIN) 1000 MCG tablet Take 1,000 mcg by mouth daily.   Yes [provider]  Melatonin 3 MG TABS Take 1 tablet (3 mg total) by mouth at bedtime. 08/16/17   Doreatha Lew, MD    Family History Family History  Problem Relation Age of Onset  . Heart attack Father        Died, 40  . Kidney disease Father   . Parkinson's disease Mother        Died, 85  . Breast cancer Sister        Living, 10  . Diabetes type II Brother        Living, 65  . Breast cancer Sister        Living, 46  . Diabetes Daughter        Living, 50  . Diabetes Son        Living, 52  . Ovarian cancer Daughter   . Colon cancer Neg Hx   . Rectal cancer Neg Hx   . Stomach cancer Neg Hx     Social History Social History   Tobacco Use  . Smoking status: Former Smoker    Packs/day: 0.25    Years: 46.00    Pack years: 11.50    Types: Cigarettes    Start date: 03/16/1965    Last attempt to quit: 06/15/2010    Years since quitting: 7.2  . Smokeless tobacco: Never Used  . Tobacco comment: quit 3 years ago  Substance Use Topics  . Alcohol use: No    Alcohol/week: 0.0 oz    Comment: 4 beers per day  . Drug use: No     Allergies   Lexapro [escitalopram oxalate]   Review of Systems Review of Systems  Constitutional: Positive for fever.  HENT: Negative for congestion.   Eyes: Negative for redness.  Respiratory: Negative for shortness of breath.   Cardiovascular: Negative for chest pain.  Gastrointestinal: Positive for abdominal distention and abdominal pain. Negative for nausea.  Genitourinary: Positive for difficulty urinating.    Musculoskeletal: Negative for back pain and neck pain.  Skin: Positive for wound.  Neurological: Negative for headaches.  Hematological: Does not bruise/bleed easily.  Psychiatric/Behavioral: Negative for confusion.     Physical Exam Updated Vital Signs BP (!) 169/86   Pulse (!) 102   Temp (!) 103.1 F (39.5 C) (Oral)   Resp (!) 22   Ht 1.778 m (5\' 10" )   Wt 78.5 kg (173 lb)   SpO2 90%   BMI 24.82 kg/m   Physical Exam  Constitutional: He is oriented to person, place, and time. He appears well-developed and well-nourished. No  distress.  HENT:  Head: Normocephalic.  Mouth/Throat: Oropharynx is clear and moist.  Patient with 3 to 4 cm swelling to the left forehead with abrasion.  No active bleeding.  Eyes: Pupils are equal, round, and reactive to light. Conjunctivae and EOM are normal.  Neck: Normal range of motion. Neck supple.  Cardiovascular: Normal rate, regular rhythm and normal heart sounds.  Pulmonary/Chest: Effort normal and breath sounds normal. No respiratory distress.  Abdominal: Soft. Bowel sounds are normal. He exhibits distension.  Patient with lower abdominal swelling  Musculoskeletal: Normal range of motion. He exhibits edema and tenderness.  Patient with good range of motion of both lower extremities.  Has some tenderness around the left hip area.  Left hand with swelling around the first metacarpal also a superficial abrasion to the dorsum of the thumb.  Radial pulse 2+.  Cap refill to the thumb normal.  Neurological: He is alert and oriented to person, place, and time. No cranial nerve deficit or sensory deficit. He exhibits normal muscle tone. Coordination normal.  Skin: Skin is warm.  Nursing note and vitals reviewed.    ED Treatments / Results  Labs (all labs ordered are listed, but only abnormal results are displayed) Labs Reviewed  CBC WITH DIFFERENTIAL/PLATELET - Abnormal; Notable for the following components:      Result Value   WBC 14.2 (*)     Hemoglobin 11.4 (*)    HCT 34.5 (*)    Neutro Abs 11.8 (*)    Lymphs Abs 0.6 (*)    Monocytes Absolute 1.8 (*)    All other components within normal limits  COMPREHENSIVE METABOLIC PANEL - Abnormal; Notable for the following components:   Sodium 134 (*)    Chloride 99 (*)    Glucose, Bld 120 (*)    Creatinine, Ser 1.33 (*)    Albumin 2.9 (*)    Total Bilirubin 1.5 (*)    GFR calc non Af Amer 49 (*)    GFR calc Af Amer 57 (*)    All other components within normal limits  URINALYSIS, ROUTINE W REFLEX MICROSCOPIC - Abnormal; Notable for the following components:   APPearance CLOUDY (*)    Hgb urine dipstick LARGE (*)    Protein, ur 100 (*)    Nitrite POSITIVE (*)    Leukocytes, UA LARGE (*)    RBC / HPF >50 (*)    WBC, UA >50 (*)    Bacteria, UA FEW (*)    All other components within normal limits  URINE CULTURE  CULTURE, BLOOD (ROUTINE X 2)  CULTURE, BLOOD (ROUTINE X 2)  LACTIC ACID, PLASMA    EKG EKG Interpretation  Date/Time:  Friday September 05 2017 15:28:56 EDT Ventricular Rate:  103 PR Interval:    QRS Duration: 156 QT Interval:  362 QTC Calculation: 474 R Axis:   91 Text Interpretation:  Sinus tachycardia Prolonged PR interval Right bundle branch block Confirmed by Fredia Sorrow [phone]) on 09/05/2017 4:15:06 PM   Radiology Ct Head Wo Contrast  Result Date: 09/05/2017 CLINICAL DATA:  [place] in restroom at rehab, states he tripped over his feet, fell onto LEFT side, LEFT frontal hematoma, history CHF, hypertension, chronic inflammatory demyelinating polyneuropathy EXAM: CT HEAD WITHOUT CONTRAST TECHNIQUE: Contiguous axial images were obtained from the base of the skull through the vertex without intravenous contrast. Sagittal and coronal MPR images reconstructed from axial data set. COMPARISON:  08/08/2017 FINDINGS: Brain: Normal ventricular morphology. No midline shift or mass effect. Normal appearance of brain  parenchyma. No intracranial hemorrhage, mass lesion, or  evidence of acute infarction. No extra-axial fluid collections. Vascular: No hyperdense vessels. Minimal atherosclerotic calcification of internal carotid arteries at skull base Skull: Calvaria intact.  Small LEFT frontal scalp hematoma. Sinuses/Orbits: Paranasal sinuses and mastoid air cells clear. Intraorbital soft tissue planes clear. Other: N/A IMPRESSION: No acute intracranial abnormalities. Small LEFT frontal scalp hematoma. Electronically Signed   By: Lavonia Dana M.D.   On: 09/05/2017 13:36   Dg Hand Complete Left  Result Date: 09/05/2017 CLINICAL DATA:  Pain following fall EXAM: LEFT HAND - COMPLETE 3+ VIEW COMPARISON:  July 14, 2013 FINDINGS: Frontal, oblique, and lateral views were obtained. There is a comminuted fracture of the proximal to mid aspects of the first metacarpal with major fracture fragments in overall near anatomic alignment. No other fractures are evident. No dislocation. There is osteoarthritic change in the first MCP joint. There is also osteoarthritic change in the fourth and fifth DIP joints. No erosive changes. IMPRESSION: Comminuted fracture proximal to mid first metacarpal with major fracture fragments in near anatomic alignment. No other fracture. No dislocation. 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 Electronically Signed   By: Lowella Grip III M.D.   On: 09/05/2017 13:16   Dg Hip Unilat W Or Wo Pelvis 2-3 Views Left  Result Date: 09/05/2017 CLINICAL DATA:  Pain following fall EXAM: DG HIP (WITH OR WITHOUT PELVIS) 2-3V LEFT COMPARISON:  None. FINDINGS: Frontal pelvis as well as frontal and lateral left hip images were obtained. There is no appreciable fracture or dislocation. There is mild symmetric narrowing of both hip joints. No erosive change. IMPRESSION: Mild symmetric narrowing of both hip joints. No fracture or dislocation. Electronically Signed   By: Lowella Grip III M.D.   On: 09/05/2017 13:17     Procedures Procedures (including critical care time)  CRITICAL CARE Performed by: Fredia Sorrow Total critical care time: 30 minutes Critical care time was exclusive of separately billable procedures and treating other patients. Critical care was necessary to treat or prevent imminent or life-threatening deterioration. Critical care was time spent personally by me on the following activities: development of treatment plan with patient and/or surrogate as well as nursing, discussions with consultants, evaluation of patient's response to treatment, examination of patient, obtaining history from patient or surrogate, ordering and performing treatments and interventions, ordering and review of laboratory studies, ordering and review of radiographic studies, pulse oximetry and re-evaluation of patient's condition.   Medications Ordered in ED Medications  cefTRIAXone (ROCEPHIN) 1 g in sodium chloride 0.9 % 100 mL IVPB (1 g Intravenous New Bag/Given 09/05/17 1704)  0.9 %  sodium chloride infusion (has no administration in time range)  sodium chloride 0.9 % bolus 500 mL (has no administration in time range)  0.9 %  sodium chloride infusion (has no administration in time range)     Initial Impression / Assessment and Plan / ED Course  I have reviewed the triage vital signs and the nursing notes.  Pertinent labs & imaging results that were available during my care of the patient were reviewed by me and considered in my medical decision making (see chart for details).    Patient will be admitted by the hospitalist for the spiking fever probably secondary to urinary tract infection.  Patient started on Rocephin for this has blood cultures pending lactic acid pending.  Initial labs without significant abnormalities other than a leukocytosis of 14,000.  In  addition secondary to the fall patient head CT was negative he does have an abrasion on the left forehead but he fell to his left side and he  does have a comminuted fracture of his first metacarpal bone of the thumb on the left side although is fairly aligned.  This will be treated with a thumb spica splint which is been approved by Dr. Amedeo Plenty and Dr. Amedeo Plenty will see him in the hospital.   Other events during the hospitalization patient was sent in for a fall from [facility].  He was due to be discharged from there today he was there because he had been unstable on his feet apparently had been rehabilitated but then he fell today hitting his left head and landing on his left side.  Head CT negative x-rays of pelvis and hip were negative and as already mentioned he had the metacarpal fracture.  It was then noted the patient had a lot of lower abdominal distention and we found out that he had a Foley catheter until the day before that had been removed in preparation for his discharge home but they never confirmed whether he had urinated or not.  We did bladder scan patient had a 800 cc of urine present Foley catheter was replaced patient had significant relief however urinalysis consistent with urinary tract infection with positive nitrite elevated white cell and red blood cells.  This most likely is the cause of the fever.  Chest x-ray has been ordered to be complete and is not back yet.  Patient has not been hypotensive there is been some tachycardia and did have a fever.  Possible sepsis not completely ruled out at this time but that is part of the reason for admission.  Patient certainly was not septic upon his presentation earlier today.  Final Clinical Impressions(s) / ED Diagnoses   Final diagnoses:  Fall, initial encounter  Fever, unspecified fever cause  Urinary retention  Acute cystitis with hematuria    ED Discharge Orders    None       Fredia Sorrow, MD 09/05/17 1807

## 2017-09-05 NOTE — H&P (Signed)
History and Physical    Jonathon [name] FTD:[id number] DOB: January 09, 1936 DOA: 09/05/2017  PCP: Marletta Lor, MD  Patient coming from: [facility]   Chief Complaint: Lytle Michaels   HPI: Jonathon [name] is a 82 y.o. male with medical history significant of chronic diastolic HF, chronic inflammatory demyelinating polyneuropathy, HTN, who was recently admitted and discharged from the hospital for toxic metabolic encephalopathy, suspected secondary to serotonin syndrome. He was discharged to SNF and was supposed to discharge from the facility today when he sustained a fall. He had foley placed at discharge from the hospital due to acute urinary retention which was removed at SNF yesterday. He states that he had significant dysuria, went to the bathroom and the stinging sensation made him fall. He denies losing consciousness. He denies fevers, chills, chest pain, shortness of breath, nausea, vomiting, diarrhea, abdominal pain. He admits to pain over MCP over left thumb.   ED Course: CT head without acute intracranial abnormalities, small left frontal scalp hematoma, CXR negative, hip xray without acute fx or dislocation, left hand xray showed comminuted fx proximal to mid first metacarpal with major fx fragments in near anatomic alignment.   Review of Systems: As per HPI otherwise 10 point review of systems negative.   Past Medical History:  Diagnosis Date  . Asthma   . BENIGN PROSTATIC HYPERTROPHY, HX OF 10/25/2008  . BRADYCARDIA 2005  . Chronic diastolic congestive heart failure ([place]) 06/05/2016  . CIDP (chronic inflammatory demyelinating polyneuropathy) ([place])   . HYPERTENSION 11/21/2006  . Iron deficiency anemia, unspecified 04/19/2013  . 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 PACEMAKER, PERMANENT 2005   Gen change 2014 Medtronic Adaptic L dual-chamber pacemaker, serial [id number]   . SVT (supraventricular tachycardia) ([place])   . TOBACCO ABUSE 10/25/2008   Quit 2012    Past Surgical  History:  Procedure Laterality Date  . COLONOSCOPY  [id number]  . PACEMAKER INSERTION  2005  . PERMANENT PACEMAKER GENERATOR CHANGE N/A 06/19/2012   Procedure: PERMANENT PACEMAKER GENERATOR CHANGE;  Surgeon: [name], MD; Medtronic Adaptic L dual-chamber pacemaker, serial #[id number]    . SKIN GRAFT Right 1962   wrist     reports that he quit smoking about 7 years ago. His smoking use included cigarettes. He started smoking about 52 years ago. He has a 11.50 pack-year smoking history. He has never used smokeless tobacco. He reports that he does not drink alcohol or use drugs.  Allergies  Allergen Reactions  . Lexapro [Escitalopram Oxalate]     3/30-08/16/17 ? Serotonin Syndrome    Family History  Problem Relation Age of Onset  . Heart attack Father        Died, 88  . Kidney disease Father   . Parkinson's disease Mother        Died, 31  . Breast cancer Sister        Living, 69  . Diabetes type II Brother        Living, 70  . Breast cancer Sister        Living, 3  . Diabetes Daughter        Living, 38  . Diabetes Son        Living, 65  . Ovarian cancer Daughter   . Colon cancer Neg Hx   . Rectal cancer Neg Hx   . Stomach cancer Neg Hx     Prior to Admission medications   Medication Sig Start Date End Date Taking? Authorizing Provider  aspirin EC  81 MG EC tablet Take 1 tablet (81 mg total) by mouth daily. 08/17/17  Yes Patrecia Pour, Christean Grief, MD  Cholecalciferol (VITAMIN D3) 50000 units CAPS Take 1 capsule by mouth once a week.   Yes [provider]  flecainide (TAMBOCOR) 150 MG tablet TAKE 1/2 TABLET BY MOUTH TWICE DAILY 07/23/17  Yes [name], MD  furosemide (LASIX) 20 MG tablet Take 1 tablet (20 mg total) by mouth daily. 04/30/17  Yes Marletta Lor, MD  Garlic 8469 MG CAPS Take 2,000 mg by mouth daily.    Yes [provider]  irbesartan (AVAPRO) 300 MG tablet TAKE 1 TABLET(300 MG) BY MOUTH DAILY 03/18/17  Yes [name], MD  memantine  (NAMENDA) 5 MG tablet Take 1 tablet (5 mg total) by mouth 2 (two) times daily. 08/16/17  Yes Patrecia Pour, Christean Grief, MD  metoprolol tartrate (LOPRESSOR) 25 MG tablet Take 1 tablet (25 mg total) by mouth 2 (two) times daily. 08/16/17  Yes Doreatha Lew, MD  NUTRITIONAL SUPPLEMENT LIQD Take 120 mLs by mouth 2 (two) times daily. MedPass   Yes [provider]  polyethylene glycol (MIRALAX / GLYCOLAX) packet Take 17 g by mouth daily. 08/17/17  Yes Patrecia Pour, Christean Grief, MD  tamsulosin (FLOMAX) 0.4 MG CAPS capsule Take 2 capsules (0.8 mg total) by mouth daily. 08/16/17  Yes Patrecia Pour, Christean Grief, MD  vitamin B-12 (CYANOCOBALAMIN) 1000 MCG tablet Take 1,000 mcg by mouth daily.   Yes [provider]  Melatonin 3 MG TABS Take 1 tablet (3 mg total) by mouth at bedtime. 08/16/17   Doreatha Lew, MD    Physical Exam: Vitals:   09/05/17 1530 09/05/17 1645 09/05/17 1707 09/05/17 1745  BP:  (!) 169/86  (!) 171/84  Pulse:  (!) 102  (!) 105  Resp:  (!) 22  (!) 25  Temp:   (!) 103.1 F (39.5 C)   TempSrc:   Oral   SpO2:  90%  95%  Weight: 78.5 kg (173 lb)     Height: 5\' 10"  (1.778 m)       Constitutional: NAD, calm, comfortable Eyes: PERRL, lids and conjunctivae normal ENMT: Mucous membranes are moist. Posterior pharynx clear of any exudate or lesions.Normal dentition.  Neck: normal, supple, no masses, no thyromegaly Respiratory: clear to auscultation bilaterally, no wheezing, no crackles. Normal respiratory effort. No accessory muscle use.  Cardiovascular: Tachycardic rate and regular rhythm, no murmurs / rubs / gallops. No extremity edema. Abdomen: no tenderness, no masses palpated. No hepatosplenomegaly. Bowel sounds positive.  Musculoskeletal: no clubbing / cyanosis. +swelling of left hand  Skin: no rashes, lesions, ulcers. No induration Neurologic: CN 2-12 grossly intact. Speech clear, nonfocal exam Psychiatric: Normal judgment and insight. Alert and oriented x 3. Normal mood.    Labs on Admission: I have personally reviewed following labs and imaging studies  CBC: Recent Labs  Lab 09/05/17 1357  WBC 14.2*  NEUTROABS 11.8*  HGB 11.4*  HCT 34.5*  MCV 80.4  PLT 629   Basic Metabolic Panel: Recent Labs  Lab 09/05/17 1357  NA 134*  K 3.6  CL 99*  CO2 22  GLUCOSE 120*  BUN 14  CREATININE 1.33*  CALCIUM 9.0   GFR: Estimated Creatinine Clearance: 46.5 mL/min (A) (by C-G formula based on SCr of 1.33 mg/dL (H)). Liver Function Tests: Recent Labs  Lab 09/05/17 1357  AST 38  ALT 25  ALKPHOS 87  BILITOT 1.5*  PROT 7.4  ALBUMIN 2.9*   No results  for input(s): LIPASE, AMYLASE in the last 168 hours. No results for input(s): AMMONIA in the last 168 hours. Coagulation Profile: No results for input(s): INR, PROTIME in the last 168 hours. Cardiac Enzymes: No results for input(s): CKTOTAL, CKMB, CKMBINDEX, TROPONINI in the last 168 hours. BNP (last 3 results) Recent Labs    01/06/17 1705  PROBNP 532.0*   HbA1C: No results for input(s): HGBA1C in the last 72 hours. CBG: No results for input(s): GLUCAP in the last 168 hours. Lipid Profile: No results for input(s): CHOL, HDL, LDLCALC, TRIG, CHOLHDL, LDLDIRECT in the last 72 hours. Thyroid Function Tests: No results for input(s): TSH, T4TOTAL, FREET4, T3FREE, THYROIDAB in the last 72 hours. Anemia Panel: No results for input(s): VITAMINB12, FOLATE, FERRITIN, TIBC, IRON, RETICCTPCT in the last 72 hours. Urine analysis:    Component Value Date/Time   COLORURINE YELLOW 09/05/2017 1347   APPEARANCEUR CLOUDY (A) 09/05/2017 1347   LABSPEC 1.008 09/05/2017 1347   PHURINE 6.0 09/05/2017 1347   GLUCOSEU NEGATIVE 09/05/2017 1347   HGBUR LARGE (A) 09/05/2017 1347   BILIRUBINUR NEGATIVE 09/05/2017 1347   KETONESUR NEGATIVE 09/05/2017 1347   PROTEINUR 100 (A) 09/05/2017 1347   UROBILINOGEN 1.0 08/10/2014 1653   NITRITE POSITIVE (A) 09/05/2017 1347   LEUKOCYTESUR LARGE (A) 09/05/2017 1347   Sepsis  Labs: !!!!!!!!!!!!!!!!!!!!!!!!!!!!!!!!!!!!!!!!!!!! @LABRCNTIP (procalcitonin:4,lacticidven:4) )No results found for this or any previous visit (from the past 240 hour(s)).   Radiological Exams on Admission: Ct Head Wo Contrast  Result Date: 09/05/2017 CLINICAL DATA:  [place] in restroom at rehab, states he tripped over his feet, fell onto LEFT side, LEFT frontal hematoma, history CHF, hypertension, chronic inflammatory demyelinating polyneuropathy EXAM: CT HEAD WITHOUT CONTRAST TECHNIQUE: Contiguous axial images were obtained from the base of the skull through the vertex without intravenous contrast. Sagittal and coronal MPR images reconstructed from axial data set. COMPARISON:  08/08/2017 FINDINGS: Brain: Normal ventricular morphology. No midline shift or mass effect. Normal appearance of brain parenchyma. No intracranial hemorrhage, mass lesion, or evidence of acute infarction. No extra-axial fluid collections. Vascular: No hyperdense vessels. Minimal atherosclerotic calcification of internal carotid arteries at skull base Skull: Calvaria intact.  Small LEFT frontal scalp hematoma. Sinuses/Orbits: Paranasal sinuses and mastoid air cells clear. Intraorbital soft tissue planes clear. Other: N/A IMPRESSION: No acute intracranial abnormalities. Small LEFT frontal scalp hematoma. Electronically Signed   By: Lavonia Dana M.D.   On: 09/05/2017 13:36   Dg Chest Port 1 View  Result Date: 09/05/2017 CLINICAL DATA:  82 y/o  M; fever.  Recent fall to left side. EXAM: PORTABLE CHEST 1 VIEW COMPARISON:  08/09/2017 chest radiograph FINDINGS: Stable heart size and mediastinal contours are within normal limits. Two lead pacemaker. Both lungs are clear. The visualized skeletal structures are unremarkable. IMPRESSION: No active disease. Electronically Signed   By: Kristine Garbe M.D.   On: 09/05/2017 17:36   Dg Hand Complete Left  Result Date: 09/05/2017 CLINICAL DATA:  Pain following fall EXAM: LEFT HAND -  COMPLETE 3+ VIEW COMPARISON:  July 14, 2013 FINDINGS: Frontal, oblique, and lateral views were obtained. There is a comminuted fracture of the proximal to mid aspects of the first metacarpal with major fracture fragments in overall near anatomic alignment. No other fractures are evident. No dislocation. There is osteoarthritic change in the first MCP joint. There is also osteoarthritic change in the fourth and fifth DIP joints. No erosive changes. IMPRESSION: Comminuted fracture proximal to mid first metacarpal with major fracture fragments in near anatomic alignment. No other fracture.  No dislocation. 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 Electronically Signed   By: Lowella Grip III M.D.   On: 09/05/2017 13:16   Dg Hip Unilat W Or Wo Pelvis 2-3 Views Left  Result Date: 09/05/2017 CLINICAL DATA:  Pain following fall EXAM: DG HIP (WITH OR WITHOUT PELVIS) 2-3V LEFT COMPARISON:  None. FINDINGS: Frontal pelvis as well as frontal and lateral left hip images were obtained. There is no appreciable fracture or dislocation. There is mild symmetric narrowing of both hip joints. No erosive change. IMPRESSION: Mild symmetric narrowing of both hip joints. No fracture or dislocation. Electronically Signed   By: Lowella Grip III M.D.   On: 09/05/2017 13:17    EKG: Independently reviewed. Normal sinus rhythm with RBBB, similar to previous EKG from 08/08/17   Assessment/Plan Principal Problem:   Fall Active Problems:   Essential hypertension   BPH (benign prostatic hyperplasia)   Sustained SVT (HCC)   CIDP (chronic inflammatory demyelinating polyneuropathy) (HCC)   Chronic diastolic congestive heart failure (HCC)   Dementia   Sepsis secondary to UTI [facility])   Urinary retention   AKI (acute kidney injury) ([place])   CAD (coronary artery disease)   Fall -Head CT negative -Left hand fracture, splint to be placed in ED. EDP will call hand surgery for  recommendation  -PT OT   Sepsis secondary to UTI, chronic foley related, POA -Blood culture pending -Urine culture pending -Rocephin -IVF  -Lactic acid pending   Urinary retention -Continue flomax  -Foley replaced in ED   AKI -Baseline Cr 0.9  -IVF  HTN -Continue metoprolol, avapro   CAD -Continue aspirin  Chronic diastolic CHF -Continue lasix -Euvolemic on exam    Hx SVT -Continue flecainide   CIDP -Has been on IVIG previously   Dementia -Continue namenda -Recent admission for suspected serotonin syndrome. Do not resume lexapro, trazodone, zyprexa    DVT prophylaxis: Lovenox Code Status: Full  Family Communication: No family at bedside Disposition Plan: Pending improvement of sepsis, may need SNF again on discharge  Consults called: None  Admission status: Inpatient    Severity of Illness: The appropriate patient status for this patient is INPATIENT. Inpatient status is judged to be reasonable and necessary in order to provide the required intensity of service to ensure the patient's safety. The patient's presenting symptoms, physical exam findings, and initial radiographic and laboratory data in the context of their chronic comorbidities is felt to place them at high risk for further clinical deterioration. Furthermore, it is not anticipated that the patient will be medically stable for discharge from the hospital within 2 midnights of admission. The following factors support the patient status of inpatient.    Dessa Phi, DO [facility] [email or web address] Password [email or web address] 09/05/2017, 6:01 PM

## 2017-09-05 NOTE — ED Notes (Signed)
Pt transported to Xray and CT. Will come back to room when returns.

## 2017-09-05 NOTE — ED Triage Notes (Addendum)
Pt arrives by [facility] after having a fall in the restroom. Pt is in rehab there due to a fall and was due to go home soon. Pt states he tripped over his feet in the restroom and fell onto his left side, pt has hematoma to left side of forehead and abrasion to left hand. Pt denies any LOC.  Pt has swelling in bilateral lower legs, worse of left than right. Pt has Medtronic pacemaker, In report ems stated on blood thinners, However unable to find documentation of that in [facility] or in pts med list from [place].

## 2017-09-05 NOTE — ED Notes (Signed)
Pt helped to restroom in wheelchair, pt unable to catch urine sample. Pt able to stand and pivot into different positions, unsteady on feet.

## 2017-09-05 NOTE — ED Notes (Signed)
Pt still having urge to Urinate, bladder scan done and pt has 800CC. Pt had foley removed 1 day ago and states has been having pain and burning in penis and unable to void.

## 2017-09-05 NOTE — Progress Notes (Signed)
Orthopedic Tech Progress Note Patient Details:  Jonathon [name] Oct 13, 1935 [id number]  Ortho Devices Type of Ortho Device: Ace wrap, Thumb spica splint Splint Material: Fiberglass Ortho Device/Splint Interventions: Application   Post Interventions Patient Tolerated: Well Instructions Provided: Care of device   [name] 09/05/2017, 6:07 PM

## 2017-09-05 NOTE — ED Notes (Signed)
Upon inserting Cathter I noticed a small wound on tip of penis and ask patient if this had been there to which he answered "Someone had told him he had a sore there at [facility] but he hasn't noticed it'.

## 2017-09-06 ENCOUNTER — Encounter (HOSPITAL_COMMUNITY): Payer: Self-pay

## 2017-09-06 LAB — BASIC METABOLIC PANEL
Anion gap: 9 (ref 5–15)
BUN: 16 mg/dL (ref 6–20)
CALCIUM: 8.1 mg/dL — AB (ref 8.9–10.3)
CO2: 24 mmol/L (ref 22–32)
Chloride: 102 mmol/L (ref 101–111)
Creatinine, Ser: 1.21 mg/dL (ref 0.61–1.24)
GFR calc Af Amer: >60 mL/min (ref >60)
GFR, EST NON AFRICAN AMERICAN: 55 mL/min — AB (ref >60)
GLUCOSE: 101 mg/dL — AB (ref 65–99)
POTASSIUM: 3.4 mmol/L — AB (ref 3.5–5.1)
Sodium: 135 mmol/L (ref 135–145)

## 2017-09-06 LAB — CBC
HCT: 30.7 % — ABNORMAL LOW (ref 39.0–52.0)
Hemoglobin: 10.2 g/dL — ABNORMAL LOW (ref 13.0–17.0)
MCH: 26.9 pg (ref 26.0–34.0)
MCHC: 33.2 g/dL (ref 30.0–36.0)
MCV: 81 fL (ref 78.0–100.0)
PLATELETS: 179 10*3/uL (ref 150–400)
RBC: 3.79 MIL/uL — ABNORMAL LOW (ref 4.22–5.81)
RDW: 14.5 % (ref 11.5–15.5)
WBC: 17.1 10*3/uL — ABNORMAL HIGH (ref 4.0–10.5)

## 2017-09-06 LAB — MRSA PCR SCREENING: MRSA by PCR: NEGATIVE

## 2017-09-06 MED ORDER — POTASSIUM CHLORIDE CRYS ER 20 MEQ PO TBCR
40.0000 meq | EXTENDED_RELEASE_TABLET | Freq: Once | ORAL | Status: DC
  Filled 2017-09-06: qty 2

## 2017-09-06 NOTE — Evaluation (Signed)
Occupational Therapy Evaluation Patient Details Name: Jonathon [name] MRN: [id number] DOB: Mar 25, 1936 Today's Date: 09/06/2017    History of Present Illness 82 yo male admitted from [facility] SNF after falling, resulting in abrasions and fracture of L first metacarpal. Found to have sepsis with UTI. Pt with recent admission with AMS and toxic metabolic encephalopathy. Hx of asthma, CHF, HTN, chronic inflammatory demyelinating polyneuropathy, pacemaker, dementia, HTN.    Clinical Impression   Pt has been ambulating with a rollator and participating in ADL training at SNF. His L thumb is immobilized in splint. Presents with impaired standing balance and requires min to max assist for ADL. Recommend return to SNF to complete rehab. Per pt, his long term plan is to go to an ALF in [facility] and daughter is setting this up.    Follow Up Recommendations  SNF    Equipment Recommendations  None recommended by OT    Recommendations for Other Services       Precautions / Restrictions Precautions Precautions: Fall Required Braces or Orthoses: Other Brace/Splint Other Brace/Splint: L thumb spica splint Restrictions Weight Bearing Restrictions: No Other Position/Activity Restrictions: adherered to avoiding weight through L thumb      Mobility Bed Mobility Overal bed mobility: Needs Assistance Bed Mobility: Supine to Sit     Supine to sit: Supervision     General bed mobility comments: for safety/line management  Transfers Overall transfer level: Needs assistance Equipment used: Rolling walker (2 wheeled) Transfers: Sit to/from Stand Sit to Stand: Min guard         General transfer comment: from bed and 3 in 1 over toilet, cues for hand placement and to avoid weight on L thumb    Balance Overall balance assessment: Needs assistance   Sitting balance-Leahy Scale: Good       Standing balance-Leahy Scale: Poor Standing balance comment: requires B UE support                            ADL either performed or assessed with clinical judgement   ADL Overall ADL's : Needs assistance/impaired Eating/Feeding: Set up;Sitting   Grooming: Minimal assistance;Standing   Upper Body Bathing: Minimal assistance;Sitting   Lower Body Bathing: Sit to/from stand;Moderate assistance   Upper Body Dressing : Minimal assistance;Sitting   Lower Body Dressing: Sit to/from stand;Moderate assistance   Toilet Transfer: Minimal assistance;RW;Ambulation   Toileting- Clothing Manipulation and Hygiene: Maximal assistance;Sitting/lateral lean       Functional mobility during ADLs: Minimal assistance;Rolling walker;Min guard       Vision Baseline Vision/History: Wears glasses Wears Glasses: Reading only Patient Visual Report: No change from baseline       Perception     Praxis      Pertinent Vitals/Pain Pain Assessment: No/denies pain     Hand Dominance Right   Extremity/Trunk Assessment Upper Extremity Assessment Upper Extremity Assessment: LUE deficits/detail LUE Deficits / Details: thumb immobilized in splint LUE Coordination: decreased fine motor   Lower Extremity Assessment Lower Extremity Assessment: Defer to PT evaluation   Cervical / Trunk Assessment Cervical / Trunk Assessment: Normal   Communication Communication Communication: No difficulties   Cognition Arousal/Alertness: Awake/alert Behavior During Therapy: WFL for tasks assessed/performed Overall Cognitive Status: History of cognitive impairments - at baseline                                 General Comments:  hx of dementia   General Comments       Exercises     Shoulder Instructions      Home Living Family/patient expects to be discharged to:: Skilled nursing facility                                 Additional Comments: per pt, his plan is to move to an ALF after SNF      Prior Functioning/Environment Level of Independence: Independent  with assistive device(s)        Comments: walking with a rollator         OT Problem List: Impaired balance (sitting and/or standing);Decreased strength;Decreased knowledge of use of DME or AE;Impaired UE functional use      OT Treatment/Interventions: Self-care/ADL training;Balance training;Patient/family education;Therapeutic activities    OT Goals(Current goals can be found in the care plan section) Acute Rehab OT Goals Patient Stated Goal: to finish rehab at SNF and then go to ALF  OT Goal Formulation: With patient Time For Goal Achievement: 09/20/17 Potential to Achieve Goals: Good ADL Goals Pt Will Perform Grooming: with supervision;standing;with set-up Pt Will Perform Upper Body Dressing: with supervision;sitting Pt Will Perform Lower Body Dressing: with min assist;sit to/from stand Pt Will Transfer to Toilet: with supervision;ambulating;bedside commode Pt Will Perform Toileting - Clothing Manipulation and hygiene: with min assist;sit to/from stand  OT Frequency: Min 2X/week   Barriers to D/C: Decreased caregiver support          Co-evaluation PT/OT/SLP Co-Evaluation/Treatment: Yes Reason for Co-Treatment: For patient/therapist safety   OT goals addressed during session: ADL's and self-care      AM-PAC PT "6 Clicks" Daily Activity     Outcome Measure Help from another person eating meals?: A Little Help from another person taking care of personal grooming?: A Little Help from another person toileting, which includes using toliet, bedpan, or urinal?: A Lot Help from another person bathing (including washing, rinsing, drying)?: A Little Help from another person to put on and taking off regular upper body clothing?: A Little Help from another person to put on and taking off regular lower body clothing?: A Lot 6 Click Score: 16   End of Session Equipment Utilized During Treatment: Gait belt;Rolling walker Nurse Communication: Mobility status  Activity Tolerance:  Patient tolerated treatment well Patient left: in chair;with call bell/phone within reach;with chair alarm set  OT Visit Diagnosis: Muscle weakness (generalized) (M62.81);Unsteadiness on feet (R26.81);History of falling (Z91.81)                Time: 4782-9562 OT Time Calculation (min): 21 min Charges:  OT General Charges $OT Visit: 1 Visit OT Evaluation $OT Eval Moderate Complexity: 1 Mod G-Codes:     2017-09-20 Nestor Lewandowsky, OTR/L Pager: [phone]  Werner Lean, Haze Boyden 2017-09-20, 12:26 PM

## 2017-09-06 NOTE — Progress Notes (Signed)
09/06/17 1244  PT Visit Information  Last PT Received On 09/06/17  Assistance Needed +1 (+2 helpful for lines )  PT/OT/SLP Co-Evaluation/Treatment Yes  Reason for Co-Treatment For patient/therapist safety  PT goals addressed during session Mobility/safety with mobility;Balance;Proper use of DME  History of Present Illness 82 yo male admitted from [facility] SNF after falling, resulting in abrasions and fracture of L first metacarpal. Found to have sepsis with UTI. Pt with recent admission with AMS and toxic metabolic encephalopathy. Hx of asthma, CHF, HTN, chronic inflammatory demyelinating polyneuropathy, pacemaker, dementia, HTN.   Precautions  Precautions Fall  Required Braces or Orthoses Other Brace/Splint  Other Brace/Splint L thumb spica splint  Restrictions  Weight Bearing Restrictions No  Other Position/Activity Restrictions adherered to avoiding weight through L thumb  Home Living  Family/patient expects to be discharged to: Skilled nursing facility  Additional Comments per pt, his plan is to move to an ALF after SNF  Prior Function  Level of Independence Independent with assistive device(s)  Comments walking with a rollator   Communication  Communication No difficulties  Pain Assessment  Pain Assessment No/denies pain  Cognition  Arousal/Alertness Awake/alert  Behavior During Therapy WFL for tasks assessed/performed  Overall Cognitive Status History of cognitive impairments - at baseline  General Comments hx of dementia  Upper Extremity Assessment  Upper Extremity Assessment Defer to OT evaluation  Lower Extremity Assessment  Lower Extremity Assessment Generalized weakness  Cervical / Trunk Assessment  Cervical / Trunk Assessment Normal  Bed Mobility  Overal bed mobility Needs Assistance  Bed Mobility Supine to Sit  Supine to sit Supervision  General bed mobility comments for safety/line management  Transfers  Overall transfer level Needs assistance  Equipment  used Rolling walker (2 wheeled)  Transfers Sit to/from Stand  Sit to Stand Min guard  General transfer comment from bed and 3 in 1 over toilet, cues for hand placement and to avoid weight on L thumb  Ambulation/Gait  Ambulation/Gait assistance +2 safety/equipment;Min assist;Min guard  Ambulation Distance (Feet) 20 Feet  Assistive device Rolling walker (2 wheeled)  Gait Pattern/deviations Step-through pattern;Decreased stride length  General Gait Details Initially unsteady gait requiring min A for steadying. Improved steadiness noted on ambulation back to chair from bathroom and required min guard A. Pt with good technique to avoid WB on L thumb.   Gait velocity Decreased   Gait velocity interpretation <1.31 ft/sec, indicative of household ambulator  Balance  Overall balance assessment Needs assistance  Sitting-balance support No upper extremity supported;Feet supported  Sitting balance-Leahy Scale Good  Standing balance support Bilateral upper extremity supported  Standing balance-Leahy Scale Poor  Standing balance comment requires B UE support  PT - End of Session  Equipment Utilized During Treatment Gait belt  Activity Tolerance Patient tolerated treatment well  Patient left in chair;with call bell/phone within reach;with chair alarm set  Nurse Communication Mobility status  PT Assessment  PT Recommendation/Assessment Patient needs continued PT services  PT Visit Diagnosis Unsteadiness on feet (R26.81);Muscle weakness (generalized) (M62.81);History of falling (Z91.81)  PT Problem List Decreased strength;Decreased mobility;Decreased cognition;Decreased activity tolerance;Decreased balance;Decreased knowledge of use of DME;Decreased range of motion  PT Plan  PT Frequency (ACUTE ONLY) Min 2X/week  PT Treatment/Interventions (ACUTE ONLY) DME instruction;Gait training;Therapeutic activities;Therapeutic exercise;Patient/family education;Balance training;Functional mobility training  AM-PAC  PT "6 Clicks" Daily Activity Outcome Measure  Difficulty turning over in bed (including adjusting bedclothes, sheets and blankets)? 3  Difficulty moving from lying on back to sitting on the side of  the bed?  3  Difficulty sitting down on and standing up from a chair with arms (e.g., wheelchair, bedside commode, etc,.)? 1  Help needed moving to and from a bed to chair (including a wheelchair)? 3  Help needed walking in hospital room? 3  Help needed climbing 3-5 steps with a railing?  2  6 Click Score 15  Mobility G Code  CK  PT Recommendation  Follow Up Recommendations SNF  PT equipment None recommended by PT  Individuals Consulted  Consulted and Agree with Results and Recommendations Patient  Acute Rehab PT Goals  Patient Stated Goal to finish rehab at SNF and then go to ALF   PT Goal Formulation With patient  Time For Goal Achievement 09/20/17  Potential to Achieve Goals Good  PT Time Calculation  PT Start Time (ACUTE ONLY) 0958  PT Stop Time (ACUTE ONLY) 1019  PT Time Calculation (min) (ACUTE ONLY) 21 min  PT General Charges  $$ ACUTE PT VISIT 1 Visit  PT Evaluation  $PT Eval Moderate Complexity 1 Mod  Written Expression  Dominant Hand Right   Pt admitted secondary to problem above with deficits below. Pt with unsteadiness during gait requiring min to min guard A with RW. Educated to limit weightbearing on L thumb, although no precautions in chart. Pt reports his plan is to return to SNF to finish rehab, and then to go to ALF. Will continue to follow acutely to maximize functional mobility independence and safety.   Leighton Ruff, PT, DPT  Acute Rehabilitation Services  Pager: [phone]

## 2017-09-06 NOTE — Consult Note (Signed)
Reason for Consult: Injury to the left thumb with fracture Referring Physician: ER physician  Jonathon [name] is an 82 y.o. male.  HPI: Patient is a 82 year old male status post injury to his left hand.  He has a minimally displaced first metacarpal fracture nonarticular nature.  I reviewed his chart and the summary of findings.  He is currently admitted for medical work-up.  I discussed with him all issues.  He is sitting up in a chair awake alert and oriented at present time.  Past Medical History:  Diagnosis Date  . Asthma   . BENIGN PROSTATIC HYPERTROPHY, HX OF 10/25/2008  . BRADYCARDIA 2005  . Chronic diastolic congestive heart failure ([place]) 06/05/2016  . CIDP (chronic inflammatory demyelinating polyneuropathy) ([place])   . HYPERTENSION 11/21/2006  . Iron deficiency anemia, unspecified 04/19/2013  . 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 PACEMAKER, PERMANENT 2005   Gen change 2014 Medtronic Adaptic L dual-chamber pacemaker, serial [id number]   . SVT (supraventricular tachycardia) ([place])   . TOBACCO ABUSE 10/25/2008   Quit 2012    Past Surgical History:  Procedure Laterality Date  . COLONOSCOPY  [id number]  . PACEMAKER INSERTION  2005  . PERMANENT PACEMAKER GENERATOR CHANGE N/A 06/19/2012   Procedure: PERMANENT PACEMAKER GENERATOR CHANGE;  Surgeon: [name], MD; Medtronic Adaptic L dual-chamber pacemaker, serial #[id number]    . SKIN GRAFT Right 1962   wrist    Family History  Problem Relation Age of Onset  . Heart attack Father        Died, 8  . Kidney disease Father   . Parkinson's disease Mother        Died, 74  . Breast cancer Sister        Living, 39  . Diabetes type II Brother        Living, 61  . Breast cancer Sister        Living, 85  . Diabetes Daughter        Living, 79  . Diabetes Son        Living, 23  . Ovarian cancer Daughter   . Colon cancer Neg Hx   . Rectal cancer Neg Hx   . Stomach cancer Neg Hx     Social History:  reports that he  quit smoking about 7 years ago. His smoking use included cigarettes. He started smoking about 52 years ago. He has a 11.50 pack-year smoking history. He has never used smokeless tobacco. He reports that he does not drink alcohol or use drugs.  Allergies:  Allergies  Allergen Reactions  . Lexapro [Escitalopram Oxalate]     3/30-08/16/17 ? Serotonin Syndrome    Medications: I have reviewed the patient's current medications.  Results for orders placed or performed during the hospital encounter of 09/05/17 (from the past 48 hour(s))  Urinalysis, Routine w reflex microscopic     Status: Abnormal   Collection Time: 09/05/17  1:47 PM  Result Value Ref Range   Color, Urine YELLOW YELLOW   APPearance CLOUDY (A) CLEAR   Specific Gravity, Urine 1.008 1.005 - 1.030   pH 6.0 5.0 - 8.0   Glucose, UA NEGATIVE NEGATIVE mg/dL   Hgb urine dipstick LARGE (A) NEGATIVE   Bilirubin Urine NEGATIVE NEGATIVE   Ketones, ur NEGATIVE NEGATIVE mg/dL   Protein, ur 100 (A) NEGATIVE mg/dL   Nitrite POSITIVE (A) NEGATIVE   Leukocytes, UA LARGE (A) NEGATIVE   RBC / HPF >50 (H) 0 - 5 RBC/hpf   WBC,  UA >50 (H) 0 - 5 WBC/hpf   Bacteria, UA FEW (A) NONE SEEN   WBC Clumps PRESENT     Comment: Performed at [facility], [place] [address], [place], [place] [address]  Urine Culture     Status: Abnormal (Preliminary result)   Collection Time: 09/05/17  1:49 PM  Result Value Ref Range   Specimen Description URINE, CLEAN CATCH    Special Requests NONE    Culture (A)     >=100,000 COLONIES/mL PSEUDOMONAS AERUGINOSA SUSCEPTIBILITIES TO FOLLOW Performed at [facility], [place] [address] [place], [place] [address]    Report Status PENDING   CBC with Differential/Platelet     Status: Abnormal   Collection Time: 09/05/17  1:57 PM  Result Value Ref Range   WBC 14.2 (H) 4.0 - 10.5 K/uL   RBC 4.29 4.22 - 5.81 MIL/uL   Hemoglobin 11.4 (L) 13.0 - 17.0 g/dL   HCT 34.5 (L) 39.0 - 52.0 %   MCV 80.4 78.0 - 100.0 fL    MCH 26.6 26.0 - 34.0 pg   MCHC 33.0 30.0 - 36.0 g/dL   RDW 14.0 11.5 - 15.5 %   Platelets 206 150 - 400 K/uL   Neutrophils Relative % 83 %   Neutro Abs 11.8 (H) 1.7 - 7.7 K/uL   Lymphocytes Relative 4 %   Lymphs Abs 0.6 (L) 0.7 - 4.0 K/uL   Monocytes Relative 13 %   Monocytes Absolute 1.8 (H) 0.1 - 1.0 K/uL   Eosinophils Relative 0 %   Eosinophils Absolute 0.0 0.0 - 0.7 K/uL   Basophils Relative 0 %   Basophils Absolute 0.0 0.0 - 0.1 K/uL    Comment: Performed at [facility] [address] [place], [place] [address]  Comprehensive metabolic panel     Status: Abnormal   Collection Time: 09/05/17  1:57 PM  Result Value Ref Range   Sodium 134 (L) 135 - 145 mmol/L   Potassium 3.6 3.5 - 5.1 mmol/L   Chloride 99 (L) 101 - 111 mmol/L   CO2 22 22 - 32 mmol/L   Glucose, Bld 120 (H) 65 - 99 mg/dL   BUN 14 6 - 20 mg/dL   Creatinine, Ser 1.33 (H) 0.61 - 1.24 mg/dL   Calcium 9.0 8.9 - 10.3 mg/dL   Total Protein 7.4 6.5 - 8.1 g/dL   Albumin 2.9 (L) 3.5 - 5.0 g/dL   AST 38 15 - 41 U/L   ALT 25 17 - 63 U/L   Alkaline Phosphatase 87 38 - 126 U/L   Total Bilirubin 1.5 (H) 0.3 - 1.2 mg/dL   GFR calc non Af Amer 49 (L) >60 mL/min   GFR calc Af Amer 57 (L) >60 mL/min    Comment: (NOTE) The eGFR has been calculated using the CKD EPI equation. This calculation has not been validated in all clinical situations. eGFR's persistently <60 mL/min signify possible Chronic Kidney Disease.    Anion gap 13 5 - 15    Comment: Performed at [facility] [address] [place], [place] [address]  Lactic acid, plasma     Status: None   Collection Time: 09/05/17  6:47 PM  Result Value Ref Range   Lactic Acid, Venous 1.3 0.5 - 1.9 mmol/L    Comment: Performed at [facility] [address], [place], [place] [address]  Culture, blood (Routine X 2) w Reflex to ID Panel     Status: None (  Preliminary result)   Collection Time: 09/05/17  6:52 PM  Result Value Ref Range   Specimen  Description BLOOD RIGHT HAND    Special Requests      BOTTLES DRAWN AEROBIC AND ANAEROBIC Blood Culture adequate volume   Culture      NO GROWTH < 24 HOURS Performed at [facility], [place] [address], [place], [name] [address]    Report Status PENDING   Culture, blood (Routine X 2) w Reflex to ID Panel     Status: None (Preliminary result)   Collection Time: 09/05/17  6:52 PM  Result Value Ref Range   Specimen Description BLOOD RIGHT HAND    Special Requests      BOTTLES DRAWN AEROBIC AND ANAEROBIC Blood Culture adequate volume   Culture      NO GROWTH < 24 HOURS Performed at [facility], [place] [address], [place], [place] [address]    Report Status PENDING   MRSA PCR Screening     Status: None   Collection Time: 09/06/17 12:07 AM  Result Value Ref Range   MRSA by PCR NEGATIVE NEGATIVE    Comment:        The GeneXpert MRSA Assay (FDA approved for NASAL specimens only), is one component of a comprehensive MRSA colonization surveillance program. It is not intended to diagnose MRSA infection nor to guide or monitor treatment for MRSA infections. Performed at [facility], [place] [address] [place], [place] [address]   CBC     Status: Abnormal   Collection Time: 09/06/17  7:10 AM  Result Value Ref Range   WBC 17.1 (H) 4.0 - 10.5 K/uL   RBC 3.79 (L) 4.22 - 5.81 MIL/uL   Hemoglobin 10.2 (L) 13.0 - 17.0 g/dL   HCT 30.7 (L) 39.0 - 52.0 %   MCV 81.0 78.0 - 100.0 fL   MCH 26.9 26.0 - 34.0 pg   MCHC 33.2 30.0 - 36.0 g/dL   RDW 14.5 11.5 - 15.5 %   Platelets 179 150 - 400 K/uL    Comment: Performed at [facility], [place] [address], [place], [place] [address]  Basic metabolic panel     Status: Abnormal   Collection Time: 09/06/17  7:10 AM  Result Value Ref Range   Sodium 135 135 - 145 mmol/L   Potassium 3.4 (L) 3.5 - 5.1 mmol/L   Chloride 102 101 - 111 mmol/L   CO2 24 22 - 32 mmol/L   Glucose, Bld 101 (H) 65 - 99 mg/dL   BUN 16 6 - 20 mg/dL   Creatinine,  Ser 1.21 0.61 - 1.24 mg/dL   Calcium 8.1 (L) 8.9 - 10.3 mg/dL   GFR calc non Af Amer 55 (L) >60 mL/min   GFR calc Af Amer >60 >60 mL/min    Comment: (NOTE) The eGFR has been calculated using the CKD EPI equation. This calculation has not been validated in all clinical situations. eGFR's persistently <60 mL/min signify possible Chronic Kidney Disease.    Anion gap 9 5 - 15    Comment: Performed at [facility] [address] [place], [place] [address]    Ct Head Wo Contrast  Result Date: 09/05/2017 CLINICAL DATA:  [place] in restroom at rehab, states he tripped over his feet, fell onto LEFT side, LEFT frontal hematoma, history CHF, hypertension, chronic inflammatory demyelinating polyneuropathy EXAM: CT HEAD WITHOUT CONTRAST TECHNIQUE: Contiguous axial images were obtained from the base of the skull  through the vertex without intravenous contrast. Sagittal and coronal MPR images reconstructed from axial data set. COMPARISON:  08/08/2017 FINDINGS: Brain: Normal ventricular morphology. No midline shift or mass effect. Normal appearance of brain parenchyma. No intracranial hemorrhage, mass lesion, or evidence of acute infarction. No extra-axial fluid collections. Vascular: No hyperdense vessels. Minimal atherosclerotic calcification of internal carotid arteries at skull base Skull: Calvaria intact.  Small LEFT frontal scalp hematoma. Sinuses/Orbits: Paranasal sinuses and mastoid air cells clear. Intraorbital soft tissue planes clear. Other: N/A IMPRESSION: No acute intracranial abnormalities. Small LEFT frontal scalp hematoma. Electronically Signed   By: Lavonia Dana M.D.   On: 09/05/2017 13:36   Dg Chest Port 1 View  Result Date: 09/05/2017 CLINICAL DATA:  82 y/o  M; fever.  Recent fall to left side. EXAM: PORTABLE CHEST 1 VIEW COMPARISON:  08/09/2017 chest radiograph FINDINGS: Stable heart size and mediastinal contours are within normal limits. Two lead pacemaker. Both lungs are clear. The  visualized skeletal structures are unremarkable. IMPRESSION: No active disease. Electronically Signed   By: Kristine Garbe M.D.   On: 09/05/2017 17:36   Dg Hand Complete Left  Result Date: 09/05/2017 CLINICAL DATA:  Pain following fall EXAM: LEFT HAND - COMPLETE 3+ VIEW COMPARISON:  July 14, 2013 FINDINGS: Frontal, oblique, and lateral views were obtained. There is a comminuted fracture of the proximal to mid aspects of the first metacarpal with major fracture fragments in overall near anatomic alignment. No other fractures are evident. No dislocation. There is osteoarthritic change in the first MCP joint. There is also osteoarthritic change in the fourth and fifth DIP joints. No erosive changes. IMPRESSION: Comminuted fracture proximal to mid first metacarpal with major fracture fragments in near anatomic alignment. No other fracture. No dislocation. 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 Electronically Signed   By: Lowella Grip III M.D.   On: 09/05/2017 13:16   Dg Hip Unilat W Or Wo Pelvis 2-3 Views Left  Result Date: 09/05/2017 CLINICAL DATA:  Pain following fall EXAM: DG HIP (WITH OR WITHOUT PELVIS) 2-3V LEFT COMPARISON:  None. FINDINGS: Frontal pelvis as well as frontal and lateral left hip images were obtained. There is no appreciable fracture or dislocation. There is mild symmetric narrowing of both hip joints. No erosive change. IMPRESSION: Mild symmetric narrowing of both hip joints. No fracture or dislocation. Electronically Signed   By: Lowella Grip III M.D.   On: 09/05/2017 13:17    Review of Systems  Eyes: Negative for pain.  Cardiovascular: Negative for chest pain.  Gastrointestinal: Negative.   Genitourinary: Negative.   Skin: Negative for itching and rash.   Blood pressure 128/68, pulse 74, temperature 98.3 F (36.8 C), temperature source Oral, resp. rate 18, height '5\' 10"'  (1.778 m), weight 80.3 kg (177 lb),  SpO2 100 %. Physical Exam patient is alert and oriented.  He is sitting in a chair.  He has a splint on his left hand.  He has some edema due to a little bit of tightness in the splint.  I have adjusted the splint and discussed with him edema control and range of motion measures.  Right upper extremity is atraumatic.  His left elbow forearm and upper arm are stable.  There is no skin lesions.  There is no evidence of compartment syndrome dystrophy or infection.  Abdomen is nontender and soft.  He has an labored breathing.  He has no evidence of visual disturbance.  I have reviewed his x-rays in detail today.  X-rays show a minimal to nondisplaced first metacarpal fracture.  This appears to be fairly stable.  I would plan for conservative algorithm of care.  We will transition him to a cast in the days ahead.  Assessment/Plan: Patient has a non-to minimally displaced first metacarpal fracture and is in a brace.  I went ahead and adjusted his splint to decrease edema and discussed with him range of motion to the fingers and edema control measures.  I would recommend keeping him in the splint and transitioning to a cast in the days ahead.  I will monitor his condition with you while in the hospital.  I will need to see him back in the office in 10 to 14 days following discharge to assess the fracture radiographically.  I informed him it is typically a 6 to 8-week healing time with some slight stiffness expected.  He is a very nice gentleman and I enjoyed chatting with him at bedside today  Willa Frater III 09/06/2017, 6:50 PM

## 2017-09-06 NOTE — Progress Notes (Signed)
PROGRESS NOTE    Jonathon [name]  QAS:[id number] DOB: Oct 26, 1935 DOA: 09/05/2017 PCP: Marletta Lor, MD     Brief Narrative:  Jonathon [name] is a 82 y.o. male with medical history significant of chronic diastolic HF, chronic inflammatory demyelinating polyneuropathy, HTN, who was recently admitted and discharged from the hospital for toxic metabolic encephalopathy, suspected secondary to serotonin syndrome. He was discharged to SNF and was supposed to discharge from the facility today when he sustained a fall. He had foley placed at discharge from the hospital due to acute urinary retention which was removed at SNF yesterday. He states that he had significant dysuria, went to the bathroom and the stinging sensation made him fall. He denies losing consciousness. In the ED, CT head without acute intracranial abnormalities, small left frontal scalp hematoma, CXR negative, hip xray without acute fx or dislocation, left hand xray showed comminuted fx proximal to mid first metacarpal with major fx fragments in near anatomic alignment. UA was consistent with UTI and he was started on empiric rocephin.   Assessment & Plan:   Principal Problem:   Fall Active Problems:   Essential hypertension   BPH (benign prostatic hyperplasia)   Sustained SVT (HCC)   CIDP (chronic inflammatory demyelinating polyneuropathy) (HCC)   Chronic diastolic congestive heart failure (HCC)   Dementia   Sepsis secondary to UTI [facility])   Urinary retention   AKI (acute kidney injury) ([place])   CAD (coronary artery disease)   Fall -Head CT negative -Left hand fracture, thumb spica splint placed in ED. EDP spoke with Dr. Amedeo Plenty for consultation  -PT OT   Sepsis secondary to UTI, chronic foley related, POA -Blood culture pending -Urine culture pending -Rocephin  Urinary retention -Continue flomax  -Foley replaced in ED   AKI -Baseline Cr 0.9  -Improving and tolerating PO, discontinue IVF and monitor BMP    HTN -Continue metoprolol, avapro   CAD -Continue aspirin  Chronic diastolic CHF -Continue lasix -Euvolemic on exam    Hx SVT -Continue flecainide   CIDP -Has been on IVIG previously   Dementia -Continue namenda -Recent admission for suspected serotonin syndrome. Do not resume lexapro, trazodone, zyprexa   Hypokalemia -Replace, trend      DVT prophylaxis: Lovenox Code Status: Full Family Communication: No family at bedside Disposition Plan: Pending urine culture, PT eval   Consultants:   Hand surgery, Dr. Amedeo Plenty consulted by EDP   Procedures:   None   Antimicrobials:  Anti-infectives (From admission, onward)   Start     Dose/Rate Route Frequency Ordered Stop   09/05/17 1900  cefTRIAXone (ROCEPHIN) 1 g in sodium chloride 0.9 % 100 mL IVPB     1 g 200 mL/hr over 30 Minutes Intravenous Every 24 hours 09/05/17 1845     09/05/17 1700  cefTRIAXone (ROCEPHIN) 1 g in sodium chloride 0.9 % 100 mL IVPB     1 g 200 mL/hr over 30 Minutes Intravenous  Once 09/05/17 1651 09/05/17 1740        Subjective: Patient feeling well today. No complaints of chest pain, shortness of breath, nausea, vomiting, diarrhea, abdominal pain. Left hand feels okay   Objective: Vitals:   09/05/17 2248 09/05/17 2324 09/06/17 0217 09/06/17 0300  BP:  (!) 164/73 (!) 149/75 133/76  Pulse:  85 82 82  Resp:  18  18  Temp: 99 F (37.2 C) 98.6 F (37 C)  98.1 F (36.7 C)  TempSrc: Oral Oral  Oral  SpO2:  100%  98%  Weight:  80.3 kg (177 lb)    Height:  5\' 10"  (1.778 m)      Intake/Output Summary (Last 24 hours) at 09/06/2017 0955 Last data filed at 09/06/2017 0400 Gross per 24 hour  Intake 1416.25 ml  Output 2000 ml  Net -583.75 ml   Filed Weights   09/05/17 1530 09/05/17 2324  Weight: 78.5 kg (173 lb) 80.3 kg (177 lb)    Examination: General exam: Appears calm and comfortable  Respiratory system: Clear to auscultation. Respiratory effort normal. Cardiovascular  system: S1 & S2 heard, RRR. No JVD, murmurs, rubs, gallops or clicks. No pedal edema. Gastrointestinal system: Abdomen is nondistended, soft and nontender. No organomegaly or masses felt. Normal bowel sounds heard. GU: Foley in place  Central nervous system: Alert and oriented. No focal neurological deficits. Extremities: +left hand in thumb spica splint  Skin: No rashes, lesions or ulcers Psychiatry: Judgement and insight appear normal. Mood & affect appropriate.   Data Reviewed: I have personally reviewed following labs and imaging studies  CBC: Recent Labs  Lab 09/05/17 1357 09/06/17 0710  WBC 14.2* 17.1*  NEUTROABS 11.8*  --   HGB 11.4* 10.2*  HCT 34.5* 30.7*  MCV 80.4 81.0  PLT 206 295   Basic Metabolic Panel: Recent Labs  Lab 09/05/17 1357 09/06/17 0710  NA 134* 135  K 3.6 3.4*  CL 99* 102  CO2 22 24  GLUCOSE 120* 101*  BUN 14 16  CREATININE 1.33* 1.21  CALCIUM 9.0 8.1*   GFR: Estimated Creatinine Clearance: 51.1 mL/min (by C-G formula based on SCr of 1.21 mg/dL). Liver Function Tests: Recent Labs  Lab 09/05/17 1357  AST 38  ALT 25  ALKPHOS 87  BILITOT 1.5*  PROT 7.4  ALBUMIN 2.9*   No results for input(s): LIPASE, AMYLASE in the last 168 hours. No results for input(s): AMMONIA in the last 168 hours. Coagulation Profile: No results for input(s): INR, PROTIME in the last 168 hours. Cardiac Enzymes: No results for input(s): CKTOTAL, CKMB, CKMBINDEX, TROPONINI in the last 168 hours. BNP (last 3 results) Recent Labs    01/06/17 1705  PROBNP 532.0*   HbA1C: No results for input(s): HGBA1C in the last 72 hours. CBG: No results for input(s): GLUCAP in the last 168 hours. Lipid Profile: No results for input(s): CHOL, HDL, LDLCALC, TRIG, CHOLHDL, LDLDIRECT in the last 72 hours. Thyroid Function Tests: No results for input(s): TSH, T4TOTAL, FREET4, T3FREE, THYROIDAB in the last 72 hours. Anemia Panel: No results for input(s): VITAMINB12, FOLATE,  FERRITIN, TIBC, IRON, RETICCTPCT in the last 72 hours. Sepsis Labs: Recent Labs  Lab 09/05/17 1847  LATICACIDVEN 1.3    Recent Results (from the past 240 hour(s))  MRSA PCR Screening     Status: None   Collection Time: 09/06/17 12:07 AM  Result Value Ref Range Status   MRSA by PCR NEGATIVE NEGATIVE Final    Comment:        The GeneXpert MRSA Assay (FDA approved for NASAL specimens only), is one component of a comprehensive MRSA colonization surveillance program. It is not intended to diagnose MRSA infection nor to guide or monitor treatment for MRSA infections. Performed at [facility], [place] [address] [place], [place] [address]        Radiology Studies: Ct Head Wo Contrast  Result Date: 09/05/2017 CLINICAL DATA:  [place] in restroom at rehab, states he tripped over his feet, fell onto LEFT side, LEFT frontal hematoma, history CHF, hypertension, chronic inflammatory  demyelinating polyneuropathy EXAM: CT HEAD WITHOUT CONTRAST TECHNIQUE: Contiguous axial images were obtained from the base of the skull through the vertex without intravenous contrast. Sagittal and coronal MPR images reconstructed from axial data set. COMPARISON:  08/08/2017 FINDINGS: Brain: Normal ventricular morphology. No midline shift or mass effect. Normal appearance of brain parenchyma. No intracranial hemorrhage, mass lesion, or evidence of acute infarction. No extra-axial fluid collections. Vascular: No hyperdense vessels. Minimal atherosclerotic calcification of internal carotid arteries at skull base Skull: Calvaria intact.  Small LEFT frontal scalp hematoma. Sinuses/Orbits: Paranasal sinuses and mastoid air cells clear. Intraorbital soft tissue planes clear. Other: N/A IMPRESSION: No acute intracranial abnormalities. Small LEFT frontal scalp hematoma. Electronically Signed   By: Lavonia Dana M.D.   On: 09/05/2017 13:36   Dg Chest Port 1 View  Result Date: 09/05/2017 CLINICAL DATA:  82 y/o  M; fever.   Recent fall to left side. EXAM: PORTABLE CHEST 1 VIEW COMPARISON:  08/09/2017 chest radiograph FINDINGS: Stable heart size and mediastinal contours are within normal limits. Two lead pacemaker. Both lungs are clear. The visualized skeletal structures are unremarkable. IMPRESSION: No active disease. Electronically Signed   By: Kristine Garbe M.D.   On: 09/05/2017 17:36   Dg Hand Complete Left  Result Date: 09/05/2017 CLINICAL DATA:  Pain following fall EXAM: LEFT HAND - COMPLETE 3+ VIEW COMPARISON:  July 14, 2013 FINDINGS: Frontal, oblique, and lateral views were obtained. There is a comminuted fracture of the proximal to mid aspects of the first metacarpal with major fracture fragments in overall near anatomic alignment. No other fractures are evident. No dislocation. There is osteoarthritic change in the first MCP joint. There is also osteoarthritic change in the fourth and fifth DIP joints. No erosive changes. IMPRESSION: Comminuted fracture proximal to mid first metacarpal with major fracture fragments in near anatomic alignment. No other fracture. No dislocation. 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 Electronically Signed   By: Lowella Grip III M.D.   On: 09/05/2017 13:16   Dg Hip Unilat W Or Wo Pelvis 2-3 Views Left  Result Date: 09/05/2017 CLINICAL DATA:  Pain following fall EXAM: DG HIP (WITH OR WITHOUT PELVIS) 2-3V LEFT COMPARISON:  None. FINDINGS: Frontal pelvis as well as frontal and lateral left hip images were obtained. There is no appreciable fracture or dislocation. There is mild symmetric narrowing of both hip joints. No erosive change. IMPRESSION: Mild symmetric narrowing of both hip joints. No fracture or dislocation. Electronically Signed   By: Lowella Grip III M.D.   On: 09/05/2017 13:17      Scheduled Meds: . aspirin EC  81 mg Oral Daily  . enoxaparin (LOVENOX) injection  40 mg Subcutaneous Q24H  .  flecainide  75 mg Oral BID  . furosemide  20 mg Oral Daily  . irbesartan  300 mg Oral Daily  . Melatonin  3 mg Oral QHS  . memantine  5 mg Oral BID  . metoprolol tartrate  25 mg Oral BID  . tamsulosin  0.8 mg Oral Daily   Continuous Infusions: . cefTRIAXone (ROCEPHIN)  IV Stopped (09/05/17 2053)     LOS: 1 day    Time spent: 25 minutes   Dessa Phi, DO [facility] [email or web address] Password [facility] 09/06/2017, 9:55 AM

## 2017-09-07 DIAGNOSIS — Z9181 History of falling: Secondary | ICD-10-CM | POA: Diagnosis not present

## 2017-09-07 DIAGNOSIS — G6181 Chronic inflammatory demyelinating polyneuritis: Secondary | ICD-10-CM | POA: Diagnosis not present

## 2017-09-07 DIAGNOSIS — S62202D Unspecified fracture of first metacarpal bone, left hand, subsequent encounter for fracture with routine healing: Secondary | ICD-10-CM | POA: Diagnosis not present

## 2017-09-07 DIAGNOSIS — R338 Other retention of urine: Secondary | ICD-10-CM | POA: Diagnosis not present

## 2017-09-07 DIAGNOSIS — M6281 Muscle weakness (generalized): Secondary | ICD-10-CM | POA: Diagnosis not present

## 2017-09-07 DIAGNOSIS — R488 Other symbolic dysfunctions: Secondary | ICD-10-CM | POA: Diagnosis not present

## 2017-09-07 DIAGNOSIS — R5381 Other malaise: Secondary | ICD-10-CM | POA: Diagnosis not present

## 2017-09-07 DIAGNOSIS — R509 Fever, unspecified: Secondary | ICD-10-CM | POA: Diagnosis not present

## 2017-09-07 DIAGNOSIS — Z87442 Personal history of urinary calculi: Secondary | ICD-10-CM | POA: Diagnosis not present

## 2017-09-07 DIAGNOSIS — I10 Essential (primary) hypertension: Secondary | ICD-10-CM | POA: Diagnosis not present

## 2017-09-07 DIAGNOSIS — I5032 Chronic diastolic (congestive) heart failure: Secondary | ICD-10-CM | POA: Diagnosis not present

## 2017-09-07 DIAGNOSIS — N179 Acute kidney failure, unspecified: Secondary | ICD-10-CM | POA: Diagnosis not present

## 2017-09-07 DIAGNOSIS — E519 Thiamine deficiency, unspecified: Secondary | ICD-10-CM | POA: Diagnosis not present

## 2017-09-07 DIAGNOSIS — S6990XA Unspecified injury of unspecified wrist, hand and finger(s), initial encounter: Secondary | ICD-10-CM | POA: Diagnosis not present

## 2017-09-07 DIAGNOSIS — I2510 Atherosclerotic heart disease of native coronary artery without angina pectoris: Secondary | ICD-10-CM | POA: Diagnosis not present

## 2017-09-07 DIAGNOSIS — I82611 Acute embolism and thrombosis of superficial veins of right upper extremity: Secondary | ICD-10-CM | POA: Diagnosis not present

## 2017-09-07 DIAGNOSIS — E876 Hypokalemia: Secondary | ICD-10-CM | POA: Diagnosis not present

## 2017-09-07 DIAGNOSIS — Z950 Presence of cardiac pacemaker: Secondary | ICD-10-CM | POA: Diagnosis not present

## 2017-09-07 DIAGNOSIS — L03114 Cellulitis of left upper limb: Secondary | ICD-10-CM | POA: Diagnosis not present

## 2017-09-07 DIAGNOSIS — W19XXXS Unspecified fall, sequela: Secondary | ICD-10-CM | POA: Diagnosis not present

## 2017-09-07 DIAGNOSIS — I110 Hypertensive heart disease with heart failure: Secondary | ICD-10-CM | POA: Diagnosis not present

## 2017-09-07 DIAGNOSIS — I471 Supraventricular tachycardia: Secondary | ICD-10-CM | POA: Diagnosis not present

## 2017-09-07 DIAGNOSIS — Z466 Encounter for fitting and adjustment of urinary device: Secondary | ICD-10-CM | POA: Diagnosis not present

## 2017-09-07 DIAGNOSIS — A4189 Other specified sepsis: Secondary | ICD-10-CM | POA: Diagnosis not present

## 2017-09-07 DIAGNOSIS — W19XXXA Unspecified fall, initial encounter: Secondary | ICD-10-CM | POA: Diagnosis not present

## 2017-09-07 DIAGNOSIS — R339 Retention of urine, unspecified: Secondary | ICD-10-CM | POA: Diagnosis not present

## 2017-09-07 DIAGNOSIS — N401 Enlarged prostate with lower urinary tract symptoms: Secondary | ICD-10-CM | POA: Diagnosis not present

## 2017-09-07 DIAGNOSIS — B965 Pseudomonas (aeruginosa) (mallei) (pseudomallei) as the cause of diseases classified elsewhere: Secondary | ICD-10-CM | POA: Diagnosis not present

## 2017-09-07 DIAGNOSIS — G92 Toxic encephalopathy: Secondary | ICD-10-CM | POA: Diagnosis not present

## 2017-09-07 DIAGNOSIS — F0390 Unspecified dementia without behavioral disturbance: Secondary | ICD-10-CM | POA: Diagnosis not present

## 2017-09-07 DIAGNOSIS — R2681 Unsteadiness on feet: Secondary | ICD-10-CM | POA: Diagnosis not present

## 2017-09-07 DIAGNOSIS — N390 Urinary tract infection, site not specified: Secondary | ICD-10-CM | POA: Diagnosis not present

## 2017-09-07 DIAGNOSIS — S62292S Other fracture of first metacarpal bone, left hand, sequela: Secondary | ICD-10-CM | POA: Diagnosis not present

## 2017-09-07 DIAGNOSIS — I5033 Acute on chronic diastolic (congestive) heart failure: Secondary | ICD-10-CM | POA: Diagnosis not present

## 2017-09-07 DIAGNOSIS — J45909 Unspecified asthma, uncomplicated: Secondary | ICD-10-CM | POA: Diagnosis not present

## 2017-09-07 DIAGNOSIS — A419 Sepsis, unspecified organism: Secondary | ICD-10-CM | POA: Diagnosis not present

## 2017-09-07 DIAGNOSIS — Z7982 Long term (current) use of aspirin: Secondary | ICD-10-CM | POA: Diagnosis not present

## 2017-09-07 DIAGNOSIS — R6520 Severe sepsis without septic shock: Secondary | ICD-10-CM | POA: Diagnosis not present

## 2017-09-07 LAB — BASIC METABOLIC PANEL
Anion gap: 5 (ref 5–15)
BUN: 17 mg/dL (ref 6–20)
CALCIUM: 7.9 mg/dL — AB (ref 8.9–10.3)
CO2: 24 mmol/L (ref 22–32)
CREATININE: 1.04 mg/dL (ref 0.61–1.24)
Chloride: 105 mmol/L (ref 101–111)
GFR calc non Af Amer: >60 mL/min (ref >60)
Glucose, Bld: 95 mg/dL (ref 65–99)
Potassium: 3.1 mmol/L — ABNORMAL LOW (ref 3.5–5.1)
SODIUM: 134 mmol/L — AB (ref 135–145)

## 2017-09-07 LAB — URINE CULTURE

## 2017-09-07 LAB — CBC
HCT: 27.3 % — ABNORMAL LOW (ref 39.0–52.0)
Hemoglobin: 8.9 g/dL — ABNORMAL LOW (ref 13.0–17.0)
MCH: 26.2 pg (ref 26.0–34.0)
MCHC: 32.6 g/dL (ref 30.0–36.0)
MCV: 80.3 fL (ref 78.0–100.0)
Platelets: 179 10*3/uL (ref 150–400)
RBC: 3.4 MIL/uL — ABNORMAL LOW (ref 4.22–5.81)
RDW: 14.2 % (ref 11.5–15.5)
WBC: 15.9 10*3/uL — ABNORMAL HIGH (ref 4.0–10.5)

## 2017-09-07 LAB — MAGNESIUM: MAGNESIUM: 1.6 mg/dL — AB (ref 1.7–2.4)

## 2017-09-07 MED ORDER — PIPERACILLIN-TAZOBACTAM 3.375 G IVPB
3.3750 g | Freq: Three times a day (TID) | INTRAVENOUS | Status: DC
  Filled 2017-09-07 (×2): qty 50

## 2017-09-07 MED ORDER — CIPROFLOXACIN HCL 500 MG PO TABS: 500.0000 mg | ORAL_TABLET | Freq: Two times a day (BID) | ORAL | 0 refills | Status: AC

## 2017-09-07 MED ORDER — CIPROFLOXACIN HCL 500 MG PO TABS: 500.0000 mg | ORAL_TABLET | Freq: Two times a day (BID) | ORAL | 0 refills | Status: DC

## 2017-09-07 MED ORDER — POTASSIUM CHLORIDE CRYS ER 20 MEQ PO TBCR
40.0000 meq | EXTENDED_RELEASE_TABLET | ORAL | Status: AC
  Administered 2017-09-07: 40 meq via ORAL
  Filled 2017-09-07: qty 2

## 2017-09-07 MED ORDER — MAGNESIUM SULFATE 2 GM/50ML IV SOLN
2.0000 g | Freq: Once | INTRAVENOUS | Status: AC
  Administered 2017-09-07: 2 g via INTRAVENOUS
  Filled 2017-09-07: qty 50

## 2017-09-07 MED ORDER — CIPROFLOXACIN HCL 500 MG PO TABS
500.0000 mg | ORAL_TABLET | Freq: Two times a day (BID) | ORAL | Status: DC
  Administered 2017-09-07: 500 mg via ORAL
  Filled 2017-09-07: qty 1

## 2017-09-07 NOTE — Progress Notes (Signed)
.  Patient will Discharge To: [facility] Date:09/07/17 Family Notified: yes, [name] [phone] Transport By: Corey Harold   Per MD patient ready for DC to [place], patient, patient's family, and facility notified of DC. Assessment, Fl2/Pasrr, and Discharge Summary sent to facility. RN given number for report [phone]). DC packet on chart. Ambulance transport requested for patient.   CSW signing off.  Reed Breech LCSWA [phone]

## 2017-09-07 NOTE — Clinical Social Work Note (Signed)
Clinical Social Work Assessment  Patient Details  Name: Jonathon [name] MRN: [id number] Date of Birth: Oct 10, 1935  Date of referral:  09/07/17               Reason for consult:  Facility Placement                Permission sought to share information with:  Facility Sport and exercise psychologist, Family Supports Permission granted to share information::  Yes, Verbal Permission Granted  Name::     Stage manager::  yes  Relationship::  Daughter  Contact Information:  yes  Housing/Transportation Living arrangements for the past 2 months:  [place] of Information:  Patient, Adult Children Patient Interpreter Needed:  None Criminal Activity/Legal Involvement Pertinent to Current Situation/Hospitalization:  No - Comment as needed Significant Relationships:  Adult Children, Siblings, Other Family Members Lives with:  Facility Resident Do you feel safe going back to the place where you live?  Yes Need for family participation in patient care:  Yes (Comment)  Care giving concerns: CSW received consult regarding  SNF placement.  CSW spoke with pt and pt's daughter/family at bedside.  Pt was at [facility] before being admitted to hospital.  Pt and daughter would like for pt to reside at [facility] for short term rehab until stronger.   Social Worker assessment / plan:  CSW spoke with pt and [pt's daughter regarding SNF placement.  Pt and Pt's daughter are agreeable for short term placement.  Employment status:  Retired Nurse, adult PT Recommendations:  [place] / Referral to community resources:  (NA)  Patient/Family's Response to care:  Pt and pt's daughter reports agreement with discharge plan to [facility] for short term stay.  Patient/Family's Understanding of and Emotional Response to Diagnosis, Current Treatment, and Prognosis:  Pt/daughter are realistic regarding therapy needs. Pt and pt's  daughter expressed understanding of CSW role and discharge process as well as medical condition.  No questions/concerns about plan or treatment  Emotional Assessment Appearance:  Appears stated age Attitude/Demeanor/Rapport:  Engaged, Gracious, Self-Confident Affect (typically observed):  Accepting, Appropriate, Calm Orientation:  Oriented to Self, Oriented to Place, Oriented to  Time, Oriented to Situation Alcohol / Substance use:  Not Applicable Psych involvement (Current and /or in the community):  No (Comment)  Discharge Needs  Concerns to be addressed:  No discharge needs identified Readmission within the last 30 days:  No Current discharge risk:  None Barriers to Discharge:  Continued Medical Work up   [employer], [place] 09/07/2017, 1:44 PM

## 2017-09-07 NOTE — Progress Notes (Signed)
Attempted report to [facility].

## 2017-09-07 NOTE — Progress Notes (Signed)
MD was paged about the patient needing to stay three nights and he only stayed two nights so far. Social Work and Scientist, research (physical sciences) says to cancel D/C.

## 2017-09-07 NOTE — Discharge Summary (Addendum)
Physician Discharge Summary  Jonathon [name] EYC:[id number] DOB: 02/02/1936 DOA: 09/05/2017  PCP: Marletta Lor, MD  Admit date: 09/05/2017 Discharge date: 09/07/2017  Admitted From:  SNF Disposition:  SNF  Recommendations for Outpatient Follow-up:  1. Follow up with PCP in 1 week 2. Follow up with Dr. Amedeo Plenty in 2 weeks  3. Repeat BMP and Mg in 1 week, hypokalemia and hypomagnesemia were replaced prior to discharge 4. Repeat CBC to check leukocytosis as infection is treated 5. Follow up final blood culture result  6. Continue voiding trials with foley removal at SNF. Outpatient urology referral recommended for acute urinary retention   Discharge Condition: Stable CODE STATUS: Full  Diet recommendation: Heart healthy   Brief/Interim Summary: Jonathon Ching Lynchis a 82 y.o.malewith medical history significant ofchronic diastolic HF,chronic inflammatory demyelinating polyneuropathy, HTN, who was recently admitted and discharged from the hospital for toxic metabolic encephalopathy, suspected secondary to serotonin syndrome. He was discharged to SNF and was supposed to discharge from the facility today when he sustained a fall. He had foley placed at discharge from the hospital due to acute urinary retention which was removed at SNF yesterday. He states that he had significant dysuria, went to the bathroom and the stinging sensation made him fall. He denies losing consciousness. In the ED, CT head without acute intracranial abnormalities, small left frontal scalp hematoma, CXR negative, hip xray without acute fx or dislocation, left hand xray showed comminuted fx proximal to mid first metacarpal with major fx fragments in near anatomic alignment.UA was consistent with UTI and he was started on empiric rocephin. He was evaluated by Dr. Amedeo Plenty with hand surgery with cast placed for his fracture. Urine culture resulted with pseudomonas, antibiotics changed to cipro.   Discharge Diagnoses:  Principal  Problem:   Fall Active Problems:   Essential hypertension   BPH (benign prostatic hyperplasia)   Sustained SVT (HCC)   CIDP (chronic inflammatory demyelinating polyneuropathy) (HCC)   Chronic diastolic congestive heart failure (HCC)   Dementia   Sepsis secondary to UTI [facility])   Urinary retention   AKI (acute kidney injury) ([place])   CAD (coronary artery disease)  Fall -Head CT negative -Left first metacarpal fracture, cast placed 4/28. Follow up with Dr. Amedeo Plenty in office 2 weeks  -PT OT   Sepsis secondary to Pseudomoas UTI,chronicfoley related, POA -Blood culture negative to date -Urine culture +Pseudomonas, sensitive to cipro -Cipro for 2 weeks   Urinary retention -Continue flomax -Foley replaced in ED  -Will discharge SNF with foley due to acute retention, follow up with voiding trial  AKI -Baseline Cr 0.9  -Resolved back to baseline   HTN -Continue metoprolol, avapro   CAD -Continue aspirin  Chronic diastolic CHF -Continue lasix -Euvolemic on exam   Hx SVT -Continue flecainide  CIDP -Has been on IVIG previously  Dementia -Continue namenda -Recent admission for suspected serotonin syndrome. Do not resume lexapro, trazodone, zyprexa  Hypokalemia -Replace, trend   Hypomagnesemia -Replace, trend      Discharge Instructions  Discharge Instructions    Diet - low sodium heart healthy   Complete by:  As directed    Increase activity slowly   Complete by:  As directed      Allergies as of 09/07/2017      Reactions   Lexapro [escitalopram Oxalate]    3/30-08/16/17 ? Serotonin Syndrome      Medication List    TAKE these medications   aspirin 81 MG EC tablet Take 1 tablet (81 mg  total) by mouth daily.   ciprofloxacin 500 MG tablet Commonly known as:  CIPRO Take 1 tablet (500 mg total) by mouth 2 (two) times daily for 14 days.   flecainide 150 MG tablet Commonly known as:  TAMBOCOR TAKE 1/2 TABLET BY MOUTH TWICE DAILY    furosemide 20 MG tablet Commonly known as:  LASIX Take 1 tablet (20 mg total) by mouth daily.   Garlic 8466 MG Caps Take 2,000 mg by mouth daily.   irbesartan 300 MG tablet Commonly known as:  AVAPRO TAKE 1 TABLET(300 MG) BY MOUTH DAILY   Melatonin 3 MG Tabs Take 1 tablet (3 mg total) by mouth at bedtime.   memantine 5 MG tablet Commonly known as:  NAMENDA Take 1 tablet (5 mg total) by mouth 2 (two) times daily.   metoprolol tartrate 25 MG tablet Commonly known as:  LOPRESSOR Take 1 tablet (25 mg total) by mouth 2 (two) times daily.   NUTRITIONAL SUPPLEMENT Liqd Take 120 mLs by mouth 2 (two) times daily. MedPass   polyethylene glycol packet Commonly known as:  MIRALAX / GLYCOLAX Take 17 g by mouth daily.   tamsulosin 0.4 MG Caps capsule Commonly known as:  FLOMAX Take 2 capsules (0.8 mg total) by mouth daily.   vitamin B-12 1000 MCG tablet Commonly known as:  CYANOCOBALAMIN Take 1,000 mcg by mouth daily.   Vitamin D3 50000 units Caps Take 1 capsule by mouth once a week.      Follow-up Information    Marletta Lor, MD. Schedule an appointment as soon as possible for a visit in 1 week(s).   Specialty:  Internal Medicine Contact information: [place] [address] [phone]        Roseanne Kaufman, MD. Schedule an appointment as soon as possible for a visit in 2 week(s).   Specialty:  Orthopedic Surgery Contact information: [address] [place] [address] [phone]          Allergies  Allergen Reactions  . Lexapro [Escitalopram Oxalate]     3/30-08/16/17 ? Serotonin Syndrome    Consultations:  Hand surgery    Procedures/Studies: Dg Chest 1 View  Result Date: 08/08/2017 CLINICAL DATA:  Altered mental status EXAM: CHEST  1 VIEW COMPARISON:  08/01/2017 FINDINGS: Left-sided pacing device as before. No consolidation or effusion. Stable cardiomediastinal silhouette. No pneumothorax. IMPRESSION:  No active disease. Electronically Signed   By: Donavan Foil M.D.   On: 08/08/2017 20:10   Dg Chest 2 View  Result Date: 08/09/2017 CLINICAL DATA:  Fever. EXAM: CHEST - 2 VIEW COMPARISON:  08/08/2017 FINDINGS: Left chest wall pacer device is noted with lead in the right atrial appendage and right ventricle. The heart size appears normal. There is no pleural effusion or edema. No airspace opacities. IMPRESSION: 1. No acute cardiopulmonary abnormalities. Electronically Signed   By: Kerby Moors M.D.   On: 08/09/2017 14:48   Ct Head Wo Contrast  Result Date: 09/05/2017 CLINICAL DATA:  [place] in restroom at rehab, states he tripped over his feet, fell onto LEFT side, LEFT frontal hematoma, history CHF, hypertension, chronic inflammatory demyelinating polyneuropathy EXAM: CT HEAD WITHOUT CONTRAST TECHNIQUE: Contiguous axial images were obtained from the base of the skull through the vertex without intravenous contrast. Sagittal and coronal MPR images reconstructed from axial data set. COMPARISON:  08/08/2017 FINDINGS: Brain: Normal ventricular morphology. No midline shift or mass effect. Normal appearance of brain parenchyma. No intracranial hemorrhage, mass lesion, or evidence of acute infarction.  No extra-axial fluid collections. Vascular: No hyperdense vessels. Minimal atherosclerotic calcification of internal carotid arteries at skull base Skull: Calvaria intact.  Small LEFT frontal scalp hematoma. Sinuses/Orbits: Paranasal sinuses and mastoid air cells clear. Intraorbital soft tissue planes clear. Other: N/A IMPRESSION: No acute intracranial abnormalities. Small LEFT frontal scalp hematoma. Electronically Signed   By: Lavonia Dana M.D.   On: 09/05/2017 13:36   Ct Head Wo Contrast  Result Date: 08/08/2017 CLINICAL DATA:  Altered mental status.  Found down. EXAM: CT HEAD WITHOUT CONTRAST TECHNIQUE: Contiguous axial images were obtained from the base of the skull through the vertex without intravenous  contrast. COMPARISON:  08/01/2017 FINDINGS: Brain: No mass lesion, intraparenchymal hemorrhage or extra-axial collection. No evidence of acute cortical infarct. Normal appearance of the brain parenchyma and extra axial spaces for age. Vascular: No hyperdense vessel or unexpected vascular calcification. Skull: Normal visualized skull base, calvarium and extracranial soft tissues. Sinuses/Orbits: No sinus fluid levels or advanced mucosal thickening. No mastoid effusion. Chronic fracture of the right lamina papyracea. IMPRESSION: Normal aging brain. Electronically Signed   By: Ulyses Jarred M.D.   On: 08/08/2017 20:08   Dg Chest Port 1 View  Result Date: 09/05/2017 CLINICAL DATA:  82 y/o  M; fever.  Recent fall to left side. EXAM: PORTABLE CHEST 1 VIEW COMPARISON:  08/09/2017 chest radiograph FINDINGS: Stable heart size and mediastinal contours are within normal limits. Two lead pacemaker. Both lungs are clear. The visualized skeletal structures are unremarkable. IMPRESSION: No active disease. Electronically Signed   By: Kristine Garbe M.D.   On: 09/05/2017 17:36   Dg Hand Complete Left  Result Date: 09/05/2017 CLINICAL DATA:  Pain following fall EXAM: LEFT HAND - COMPLETE 3+ VIEW COMPARISON:  July 14, 2013 FINDINGS: Frontal, oblique, and lateral views were obtained. There is a comminuted fracture of the proximal to mid aspects of the first metacarpal with major fracture fragments in overall near anatomic alignment. No other fractures are evident. No dislocation. There is osteoarthritic change in the first MCP joint. There is also osteoarthritic change in the fourth and fifth DIP joints. No erosive changes. IMPRESSION: Comminuted fracture proximal to mid first metacarpal with major fracture fragments in near anatomic alignment. No other fracture. No dislocation. 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 Electronically Signed   By: Lowella Grip III  M.D.   On: 09/05/2017 13:16   Dg Hip Unilat W Or Wo Pelvis 2-3 Views Left  Result Date: 09/05/2017 CLINICAL DATA:  Pain following fall EXAM: DG HIP (WITH OR WITHOUT PELVIS) 2-3V LEFT COMPARISON:  None. FINDINGS: Frontal pelvis as well as frontal and lateral left hip images were obtained. There is no appreciable fracture or dislocation. There is mild symmetric narrowing of both hip joints. No erosive change. IMPRESSION: Mild symmetric narrowing of both hip joints. No fracture or dislocation. Electronically Signed   By: Lowella Grip III M.D.   On: 09/05/2017 13:17      Discharge Exam: Vitals:   09/07/17 0532 09/07/17 0950  BP: (!) 143/79 140/70  Pulse: 68 70  Resp: 16   Temp: 98.9 F (37.2 C)   SpO2: 99%     General: Pt is alert, awake, not in acute distress Cardiovascular: RRR, S1/S2 +, no rubs, no gallops Respiratory: CTA bilaterally, no wheezing, no rhonchi Abdominal: Soft, NT, ND, bowel sounds + Extremities: no edema, no cyanosis    The results of significant diagnostics from this hospitalization (including  imaging, microbiology, ancillary and laboratory) are listed below for reference.     Microbiology: Recent Results (from the past 240 hour(s))  Urine Culture     Status: Abnormal   Collection Time: 09/05/17  1:49 PM  Result Value Ref Range Status   Specimen Description URINE, CLEAN CATCH  Final   Special Requests   Final    NONE Performed at [facility], [address], [place], [place] [address]    Culture >=100,000 COLONIES/mL PSEUDOMONAS AERUGINOSA (A)  Final   Report Status 09/07/2017 FINAL  Final   Organism ID, Bacteria PSEUDOMONAS AERUGINOSA (A)  Final      Susceptibility   Pseudomonas aeruginosa - MIC*    CEFTAZIDIME 2 SENSITIVE Sensitive     CIPROFLOXACIN <=0.25 SENSITIVE Sensitive     GENTAMICIN <=1 SENSITIVE Sensitive     IMIPENEM 2 SENSITIVE Sensitive     PIP/TAZO <=4 SENSITIVE Sensitive     CEFEPIME <=1 SENSITIVE Sensitive     * >=100,000  COLONIES/mL PSEUDOMONAS AERUGINOSA  Culture, blood (Routine X 2) w Reflex to ID Panel     Status: None (Preliminary result)   Collection Time: 09/05/17  6:52 PM  Result Value Ref Range Status   Specimen Description BLOOD RIGHT HAND  Final   Special Requests   Final    BOTTLES DRAWN AEROBIC AND ANAEROBIC Blood Culture adequate volume   Culture   Final    NO GROWTH < 24 HOURS Performed at [facility], [address] [place], [place] [address]    Report Status PENDING  Incomplete  Culture, blood (Routine X 2) w Reflex to ID Panel     Status: None (Preliminary result)   Collection Time: 09/05/17  6:52 PM  Result Value Ref Range Status   Specimen Description BLOOD RIGHT HAND  Final   Special Requests   Final    BOTTLES DRAWN AEROBIC AND ANAEROBIC Blood Culture adequate volume   Culture   Final    NO GROWTH < 24 HOURS Performed at [facility], [place] [address] [place], [place] [address]    Report Status PENDING  Incomplete  MRSA PCR Screening     Status: None   Collection Time: 09/06/17 12:07 AM  Result Value Ref Range Status   MRSA by PCR NEGATIVE NEGATIVE Final    Comment:        The GeneXpert MRSA Assay (FDA approved for NASAL specimens only), is one component of a comprehensive MRSA colonization surveillance program. It is not intended to diagnose MRSA infection nor to guide or monitor treatment for MRSA infections. Performed at [facility], [place] [address], [place], [place] [address]      Labs: BNP (last 3 results) No results for input(s): BNP in the last 8760 hours. Basic Metabolic Panel: Recent Labs  Lab 09/05/17 1357 09/06/17 0710 09/07/17 0548  NA 134* 135 134*  K 3.6 3.4* 3.1*  CL 99* 102 105  CO2 22 24 24   GLUCOSE 120* 101* 95  BUN 14 16 17   CREATININE 1.33* 1.21 1.04  CALCIUM 9.0 8.1* 7.9*  MG  --   --  1.6*   Liver Function Tests: Recent Labs  Lab 09/05/17 1357  AST 38  ALT 25  ALKPHOS 87  BILITOT 1.5*  PROT 7.4  ALBUMIN  2.9*   No results for input(s): LIPASE, AMYLASE in the last 168 hours. No results for input(s): AMMONIA in the last 168 hours. CBC: Recent Labs  Lab  09/05/17 1357 09/06/17 0710 09/07/17 0548  WBC 14.2* 17.1* 15.9*  NEUTROABS 11.8*  --   --   HGB 11.4* 10.2* 8.9*  HCT 34.5* 30.7* 27.3*  MCV 80.4 81.0 80.3  PLT 206 179 179   Cardiac Enzymes: No results for input(s): CKTOTAL, CKMB, CKMBINDEX, TROPONINI in the last 168 hours. BNP: Invalid input(s): POCBNP CBG: No results for input(s): GLUCAP in the last 168 hours. D-Dimer No results for input(s): DDIMER in the last 72 hours. Hgb A1c No results for input(s): HGBA1C in the last 72 hours. Lipid Profile No results for input(s): CHOL, HDL, LDLCALC, TRIG, CHOLHDL, LDLDIRECT in the last 72 hours. Thyroid function studies No results for input(s): TSH, T4TOTAL, T3FREE, THYROIDAB in the last 72 hours.  Invalid input(s): FREET3 Anemia work up No results for input(s): VITAMINB12, FOLATE, FERRITIN, TIBC, IRON, RETICCTPCT in the last 72 hours. Urinalysis    Component Value Date/Time   COLORURINE YELLOW 09/05/2017 1347   APPEARANCEUR CLOUDY (A) 09/05/2017 1347   LABSPEC 1.008 09/05/2017 1347   PHURINE 6.0 09/05/2017 1347   GLUCOSEU NEGATIVE 09/05/2017 1347   HGBUR LARGE (A) 09/05/2017 1347   BILIRUBINUR NEGATIVE 09/05/2017 1347   KETONESUR NEGATIVE 09/05/2017 1347   PROTEINUR 100 (A) 09/05/2017 1347   UROBILINOGEN 1.0 08/10/2014 1653   NITRITE POSITIVE (A) 09/05/2017 1347   LEUKOCYTESUR LARGE (A) 09/05/2017 1347   Sepsis Labs Invalid input(s): PROCALCITONIN,  WBC,  LACTICIDVEN Microbiology Recent Results (from the past 240 hour(s))  Urine Culture     Status: Abnormal   Collection Time: 09/05/17  1:49 PM  Result Value Ref Range Status   Specimen Description URINE, CLEAN CATCH  Final   Special Requests   Final    NONE Performed at [facility], [place] [address], [place], [place] [address]    Culture >=100,000 COLONIES/mL  PSEUDOMONAS AERUGINOSA (A)  Final   Report Status 09/07/2017 FINAL  Final   Organism ID, Bacteria PSEUDOMONAS AERUGINOSA (A)  Final      Susceptibility   Pseudomonas aeruginosa - MIC*    CEFTAZIDIME 2 SENSITIVE Sensitive     CIPROFLOXACIN <=0.25 SENSITIVE Sensitive     GENTAMICIN <=1 SENSITIVE Sensitive     IMIPENEM 2 SENSITIVE Sensitive     PIP/TAZO <=4 SENSITIVE Sensitive     CEFEPIME <=1 SENSITIVE Sensitive     * >=100,000 COLONIES/mL PSEUDOMONAS AERUGINOSA  Culture, blood (Routine X 2) w Reflex to ID Panel     Status: None (Preliminary result)   Collection Time: 09/05/17  6:52 PM  Result Value Ref Range Status   Specimen Description BLOOD RIGHT HAND  Final   Special Requests   Final    BOTTLES DRAWN AEROBIC AND ANAEROBIC Blood Culture adequate volume   Culture   Final    NO GROWTH < 24 HOURS Performed at [facility], [address], [place], [place] [address]    Report Status PENDING  Incomplete  Culture, blood (Routine X 2) w Reflex to ID Panel     Status: None (Preliminary result)   Collection Time: 09/05/17  6:52 PM  Result Value Ref Range Status   Specimen Description BLOOD RIGHT HAND  Final   Special Requests   Final    BOTTLES DRAWN AEROBIC AND ANAEROBIC Blood Culture adequate volume   Culture   Final    NO GROWTH < 24 HOURS Performed at [facility], [place] [address], [place], [place] [address]    Report Status PENDING  Incomplete  MRSA PCR Screening     Status: None   Collection Time: 09/06/17 12:07 AM  Result Value Ref Range Status   MRSA by PCR NEGATIVE NEGATIVE Final    Comment:        The GeneXpert MRSA Assay (FDA approved for NASAL specimens only), is one component of a comprehensive MRSA colonization surveillance program. It is not intended to diagnose MRSA infection nor to guide or monitor treatment for MRSA infections. Performed at [facility], [place] [address], [place], [place] [address]      Patient was seen and examined on  the day of discharge and was found to be in stable condition. Time coordinating discharge: 40 minutes including assessment and coordination of care, as well as examination of the patient.   SIGNED:  Dessa Phi, DO [facility] Pager [phone]  If 7PM-7AM, please contact night-coverage [email or web address] Password [email or web address] 09/07/2017, 11:00 AM

## 2017-09-07 NOTE — Progress Notes (Signed)
Orthopedic Tech Progress Note Patient Details:  JASN XIA [birth year] [id number]  Casting Type of Cast: Short arm cast Cast Location: lue Cast Material: Fiberglass Cast Intervention: Application  Post Interventions Patient Tolerated: Well Instructions Provided: Care of device As ordered by Dr. Freddi Starr, Kerryn Tennant 09/07/2017, 9:56 AM

## 2017-09-07 NOTE — Progress Notes (Signed)
Jonathon [name] to be D/C'd Skilled nursing facility per MD order.  Discussed with the patient and all questions fully answered.  IV catheter discontinued intact. Site without signs and symptoms of complications. Dressing and pressure applied.  An After Visit Summary was printed and given to the patient. Patient received prescription.  D/c education completed with patient/family including follow up instructions, medication list, d/c activities limitations if indicated, with other d/c instructions as indicated by MD - patient able to verbalize understanding, all questions fully answered.    [place] 09/07/2017 2:53 PM

## 2017-09-07 NOTE — NC FL2 (Signed)
[place] LEVEL OF CARE SCREENING TOOL     IDENTIFICATION  Patient Name: Jonathon [name] Birthdate: 1935/09/08 Sex: male Admission Date (Current Location): 09/05/2017  [facility] and [place] Number:  Herbalist and Address:  [facility] [place]. [facility], [place] [address], [place], [place] [address]      Provider Number: [id number]  Attending Physician Name and Address:  Dessa Phi, DO  Relative Name and Phone Number:       Current Level of Care:   Recommended Level of Care:   Prior Approval Number:    Date Approved/Denied:   PASRR Number:    Discharge Plan:      Current Diagnoses: Patient Active Problem List   Diagnosis Date Noted  . Fall 09/05/2017  . Sepsis secondary to UTI ([place]) 09/05/2017  . Urinary retention 09/05/2017  . AKI (acute kidney injury) ([place]) 09/05/2017  . CAD (coronary artery disease) 09/05/2017  . Superficial vein thrombosis 08/19/2017  . Altered mental status   . Metabolic encephalopathy 94/85/4627  . Dementia 08/04/2017  . Seizure ([place]) 08/01/2017  . Thiamine deficiency 05/27/2017  . B12 deficiency 04/30/2017  . Alcoholism ([place]) 01/28/2017  . Acute encephalopathy 12/28/2016  . Hepatic steatosis 06/05/2016  . Physical deconditioning 06/05/2016  . Chronic diastolic congestive heart failure ([place]) 06/05/2016  . Orthostatic syncope   . Elevated liver function tests   . LOC (loss of consciousness) ([place]) 08/10/2014  . CIDP (chronic inflammatory demyelinating polyneuropathy) ([place]) 08/08/2014  . Sustained SVT ([place]) 07/27/2014  . 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 Abnormal LFTs 08/06/2013  . Iron deficiency anemia 04/19/2013  . Muscle weakness 01/13/2013  . External hemorrhoids 03/23/2011  . Symptomatic bradycardia 10/25/2008  . 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 PACEMAKER, PERMANENT 10/25/2008  . Essential hypertension 11/21/2006  . BPH (benign prostatic hyperplasia) 11/21/2006  . History of colonic polyps  11/21/2006    Orientation RESPIRATION BLADDER Height & Weight        Normal Continent Weight: 177 lb (80.3 kg) Height:  5\' 10"  (177.8 cm)  BEHAVIORAL SYMPTOMS/MOOD NEUROLOGICAL BOWEL NUTRITION STATUS    (NA) Continent (NA)  AMBULATORY STATUS COMMUNICATION OF NEEDS Skin   Limited Assist Verbally Normal                       Personal Care Assistance Level of Assistance  Feeding, Bathing Bathing Assistance: Limited assistance Feeding assistance: Limited assistance Dressing Assistance: Limited assistance     Functional Limitations Info  (NA) Sight Info: Adequate Hearing Info: Adequate Speech Info: Adequate    SPECIAL CARE FACTORS FREQUENCY  OT (By licensed OT)       OT Frequency: 2x week            Contractures Contractures Info: Not present    Additional Factors Info  Allergies Code Status Info: Full Allergies Info: Lexapro (Escitalopram Syndrome)           Current Medications (09/07/2017):  This is the current hospital active medication list Current Facility-Administered Medications  Medication Dose Route Frequency Provider Last Rate Last Dose  . acetaminophen (TYLENOL) tablet 650 mg  650 mg Oral Q6H PRN Dessa Phi, DO   650 mg at 09/05/17 1827   Or  . acetaminophen (TYLENOL) suppository 650 mg  650 mg Rectal Q6H PRN Dessa Phi, DO      . aspirin EC tablet 81 mg  81 mg Oral Daily Dessa Phi, DO   81 mg at 09/07/17 0950  . ciprofloxacin (CIPRO)  tablet 500 mg  500 mg Oral BID Dessa Phi, DO   500 mg at 09/07/17 0957  . enoxaparin (LOVENOX) injection 40 mg  40 mg Subcutaneous Q24H Dessa Phi, DO   40 mg at 09/06/17 1836  . flecainide (TAMBOCOR) tablet 75 mg  75 mg Oral BID Dessa Phi, DO   75 mg at 09/07/17 0950  . furosemide (LASIX) tablet 20 mg  20 mg Oral Daily Dessa Phi, DO   20 mg at 09/07/17 3614  . HYDROcodone-acetaminophen (NORCO/VICODIN) 5-325 MG per tablet 1-2 tablet  1-2 tablet Oral Q4H PRN Dessa Phi, DO   2  tablet at 09/06/17 1837  . irbesartan (AVAPRO) tablet 300 mg  300 mg Oral Daily Dessa Phi, DO   300 mg at 09/07/17 4315  . Melatonin TABS 3 mg  3 mg Oral QHS Dessa Phi, DO   3 mg at 09/06/17 2237  . memantine (NAMENDA) tablet 5 mg  5 mg Oral BID Dessa Phi, DO   5 mg at 09/07/17 0944  . metoprolol tartrate (LOPRESSOR) tablet 25 mg  25 mg Oral BID Dessa Phi, DO   25 mg at 09/07/17 0950  . ondansetron (ZOFRAN) tablet 4 mg  4 mg Oral Q6H PRN Dessa Phi, DO       Or  . ondansetron [facility]) injection 4 mg  4 mg Intravenous Q6H PRN Dessa Phi, DO      . potassium chloride SA (K-DUR,KLOR-CON) CR tablet 40 mEq  40 mEq Oral Once Dessa Phi, DO      . potassium chloride SA (K-DUR,KLOR-CON) CR tablet 40 mEq  40 mEq Oral Q4H Dessa Phi, DO   40 mEq at 09/07/17 0949  . senna-docusate (Senokot-S) tablet 1 tablet  1 tablet Oral QHS PRN Dessa Phi, DO      . tamsulosin [facility]) capsule 0.8 mg  0.8 mg Oral Daily Dessa Phi, DO   0.8 mg at 09/07/17 4008     Discharge Medications: Please see discharge summary for a list of discharge medications.  Relevant Imaging Results:  Relevant Lab Results:   Additional Information SS# [id number]  Carolin Sicks, [place]

## 2017-09-07 NOTE — Progress Notes (Signed)
Patient ID: Jonathon [name], male   DOB: 1935/09/21, 82 y.o.   MRN: [id number] Patient is seen at bedside in follow-up regarding his first metacarpal fracture.  He has a left first metacarpal fracture minimally displaced.  I reviewed this with him at length.  He was placed in a cast today at bedside.  There were no complicating features.  I discussed with patient I would like to see him back in the office in 2 weeks for repeat x-rays out of the cast and likely a continued casting for up to 4 to 6 weeks followed by a removable brace and therapeutic endeavors.  At present time the patient is aware of this.  I have given him my business card so that he can make sure that his daughter gets the appointment set up once he is discharged from the hospital.  I will see him back as needed until the 2-week follow-up visit.  He has my business card and I asked him to notify me should any problems occur.  His casting went well today without difficulty.  Marijane Trower MD cellular phone [phone]  [phone]

## 2017-09-08 ENCOUNTER — Non-Acute Institutional Stay (SKILLED_NURSING_FACILITY): Payer: Medicare Other | Admitting: Adult Health

## 2017-09-08 ENCOUNTER — Encounter: Payer: Self-pay | Admitting: Adult Health

## 2017-09-08 DIAGNOSIS — I10 Essential (primary) hypertension: Secondary | ICD-10-CM | POA: Diagnosis not present

## 2017-09-08 DIAGNOSIS — S62292S Other fracture of first metacarpal bone, left hand, sequela: Secondary | ICD-10-CM | POA: Diagnosis not present

## 2017-09-08 DIAGNOSIS — N390 Urinary tract infection, site not specified: Secondary | ICD-10-CM

## 2017-09-08 DIAGNOSIS — E876 Hypokalemia: Secondary | ICD-10-CM | POA: Diagnosis not present

## 2017-09-08 DIAGNOSIS — I2510 Atherosclerotic heart disease of native coronary artery without angina pectoris: Secondary | ICD-10-CM

## 2017-09-08 DIAGNOSIS — F0390 Unspecified dementia without behavioral disturbance: Secondary | ICD-10-CM | POA: Diagnosis not present

## 2017-09-08 DIAGNOSIS — I5032 Chronic diastolic (congestive) heart failure: Secondary | ICD-10-CM

## 2017-09-08 DIAGNOSIS — R339 Retention of urine, unspecified: Secondary | ICD-10-CM | POA: Diagnosis not present

## 2017-09-08 DIAGNOSIS — W19XXXS Unspecified fall, sequela: Secondary | ICD-10-CM | POA: Diagnosis not present

## 2017-09-08 DIAGNOSIS — I471 Supraventricular tachycardia: Secondary | ICD-10-CM | POA: Diagnosis not present

## 2017-09-08 DIAGNOSIS — G6181 Chronic inflammatory demyelinating polyneuritis: Secondary | ICD-10-CM

## 2017-09-08 NOTE — Progress Notes (Addendum)
Location:  [facility] Room Number: 107-A Place of Service:  SNF (31) Provider:  Durenda Age, NP  Patient Care Team: Marletta Lor, MD as PCP - General [name], MD (Cardiology) [name], MD (Cardiology)  Extended Emergency Contact Information Primary Emergency Contact: [facility] Address: [address]          [place], [place] [address] Johnnette Litter of [place] Phone: [phone] Relation: Daughter Secondary Emergency Contact: [place] Phone: [phone] Relation: Niece  Code Status:  Full Code  Goals of care: Advanced Directive information Advanced Directives 09/05/2017  Does Patient Have a Medical Advance Directive? Yes  Type of Advance Directive [place]  Does patient want to make changes to medical advance directive? No - Patient declined  Copy of [place] in Chart? No - copy requested  Would patient like information on creating a medical advance directive? -  Pre-existing out of facility DNR order (yellow form or pink MOST form) -     Chief Complaint  Patient presents with  . Acute Visit    Patient seen for hospital followup, status post admission at [facility] 4/26-4/28/19 for a urinary tract infection    HPI:  Pt is a 82 y.o. male seen today for hospital followup.  He was readmitted to [facility] on 09/08/17 after an admission at [facility] 09/05/17-09/07/17, status post fall and was diagnosed with a UTI. He was started on Rocephin. Urine culture was positive for Pseudomonas so antibiotic was changed to Cipro. He is a short-term rehabilitation resident at [facility].  He has a PMH of asthma, chronic inflammatory demyelinating polyneuropathy, chronic diastolic CHF with pacemaker, and nephrolithiasis. He was having short-term rehabilitation at [facility] before going to the  hospital  for a hospitalization dates 08/09/17 to 08/16/17 due to toxic metabolic encephalopathy which is suspected to be related to serotonin syndrome and cellulitis of left arm. He has completed keflex therapy. He was seen in the room today and was noted to have foley catheter draining to urine bag with clear yellowish urine. He requested that trial voiding be done on Wednesday.    Past Medical History:  Diagnosis Date  . Asthma   . BENIGN PROSTATIC HYPERTROPHY, HX OF 10/25/2008  . BRADYCARDIA 2005  . Chronic diastolic congestive heart failure ([place]) 06/05/2016  . CIDP (chronic inflammatory demyelinating polyneuropathy) ([place])   . HYPERTENSION 11/21/2006  . Iron deficiency anemia, unspecified 04/19/2013  . 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 PACEMAKER, PERMANENT 2005   Gen change 2014 Medtronic Adaptic L dual-chamber pacemaker, serial [id number]   . SVT (supraventricular tachycardia) ([place])   . TOBACCO ABUSE 10/25/2008   Quit 2012   Past Surgical History:  Procedure Laterality Date  . COLONOSCOPY  [id number]  . PACEMAKER INSERTION  2005  . PERMANENT PACEMAKER GENERATOR CHANGE N/A 06/19/2012   Procedure: PERMANENT PACEMAKER GENERATOR CHANGE;  Surgeon: [name], MD; Medtronic Adaptic L dual-chamber pacemaker, serial #[id number]    . SKIN GRAFT Right 1962   wrist    Allergies  Allergen Reactions  . Lexapro [Escitalopram Oxalate]     3/30-08/16/17 ? Serotonin Syndrome    Outpatient Encounter Medications as of 09/08/2017  Medication Sig  . aspirin EC 81 MG EC tablet Take 1 tablet (81 mg total) by mouth daily.  . Cholecalciferol (VITAMIN D3) 50000 units CAPS Take 1 capsule by mouth once a week.  Marland Kitchen  ciprofloxacin (CIPRO) 500 MG tablet Take 1 tablet (500 mg total) by mouth 2 (two) times daily for 14 days.  . flecainide (TAMBOCOR) 150 MG tablet TAKE 1/2 TABLET BY MOUTH TWICE DAILY  . furosemide (LASIX) 20 MG tablet Take 1 tablet (20 mg total) by mouth daily.  . Garlic 1610 MG  CAPS Take 2,000 mg by mouth daily.   . irbesartan (AVAPRO) 300 MG tablet TAKE 1 TABLET(300 MG) BY MOUTH DAILY  . Melatonin 3 MG TABS Take 1 tablet (3 mg total) by mouth at bedtime.  . memantine (NAMENDA) 5 MG tablet Take 1 tablet (5 mg total) by mouth 2 (two) times daily.  . metoprolol tartrate (LOPRESSOR) 25 MG tablet Take 1 tablet (25 mg total) by mouth 2 (two) times daily.  Marland Kitchen NUTRITIONAL SUPPLEMENT LIQD Take 120 mLs by mouth 2 (two) times daily. MedPass  . polyethylene glycol (MIRALAX / GLYCOLAX) packet Take 17 g by mouth daily.  . tamsulosin (FLOMAX) 0.4 MG CAPS capsule Take 2 capsules (0.8 mg total) by mouth daily.  . vitamin B-12 (CYANOCOBALAMIN) 1000 MCG tablet Take 1,000 mcg by mouth daily.   No facility-administered encounter medications on file as of 09/08/2017.     Review of Systems  GENERAL: No change in appetite, no fatigue, no weight changes, no fever, chills or weakness MOUTH and THROAT: Denies oral discomfort, gingival pain or bleeding, pain from teeth or hoarseness   RESPIRATORY: no cough, SOB, DOE, wheezing, hemoptysis CARDIAC: No chest pain, edema or palpitations GI: No abdominal pain, diarrhea, constipation, heart burn, nausea or vomiting GU: Denies dysuria, frequency, hematuria, incontinence, or discharge PSYCHIATRIC: Denies feelings of depression or anxiety. No report of hallucinations, insomnia, paranoia, or agitation   Immunization History  Administered Date(s) Administered  . Influenza Whole 02/10/2010  . Influenza, High Dose Seasonal PF 01/30/2015, 04/30/2016, 01/28/2017  . Influenza,inj,Quad PF,6+ Mos 01/19/2013  . Influenza-Unspecified 02/03/2014  . PPD Test 01/28/2017  . Pneumococcal Conjugate-13 08/08/2014  . Pneumococcal Polysaccharide-23 01/14/2013  . Tetanus 01/14/2013   Pertinent  Health Maintenance Due  Topic Date Due  . INFLUENZA VACCINE  12/11/2017  . PNA vac Low Risk Adult  Completed   Fall Risk  05/27/2017 01/08/2017 01/06/2017 01/06/2017  12/09/2016  Falls in the past year? Yes Yes Yes Yes Yes  Comment - - - - -  Number falls in past yr: 1 2 or more 2 or more 2 or more 1  Injury with Fall? No No No No No  Risk Factor Category  - High Fall Risk - - -  Risk for fall due to : Impaired mobility;Impaired balance/gait Impaired balance/gait;Impaired mobility Impaired balance/gait;Other (Comment) - Impaired balance/gait;Impaired mobility  Risk for fall due to: Comment - - - - -  Follow up Falls evaluation completed;Education provided;Falls prevention discussed Falls evaluation completed;Education provided;Falls prevention discussed - - Falls evaluation completed;Education provided;Falls prevention discussed      Vitals:   09/08/17 0843  BP: 131/75  Pulse: 69  Resp: 20  Temp: 98.5 F (36.9 C)  TempSrc: Oral  SpO2: 95%  Weight: 177 lb (80.3 kg)  Height: 5\' 10"  (1.778 m)   Body mass index is 25.4 kg/m.  Physical Exam  GENERAL APPEARANCE: Well nourished. In no acute distress. Normal body habitus SKIN:  Skin is warm and dry.  MOUTH and THROAT: Lips are without lesions. Oral mucosa is moist and without lesions. Tongue is normal in shape, size, and color and without lesions RESPIRATORY: Breathing is even & unlabored, BS CTAB CARDIAC:  RRR, no murmur,no extra heart sounds, no edema GI: Abdomen soft, normal BS, no masses, no tenderness GU:  Has FC draining to urine bag with clear yellowish urine EXTREMITIES:  Able to move X 4 extremities, left hand with cast PSYCHIATRIC: Alert and oriented X 3. Affect and behavior are appropriate   Labs reviewed: Recent Labs    08/03/17 0436 08/05/17 0542  09/05/17 1357 09/06/17 0710 09/07/17 0548  NA 136 140   < > 134* 135 134*  K 3.9 3.7   < > 3.6 3.4* 3.1*  CL 102 105   < > 99* 102 105  CO2 25 25   < > 22 24 24   GLUCOSE 106* 98   < > 120* 101* 95  BUN 16 20   < > 14 16 17   CREATININE 0.95 1.27*   < > 1.33* 1.21 1.04  CALCIUM 9.0 8.6*   < > 9.0 8.1* 7.9*  MG 1.7 1.8  --   --    --  1.6*   < > = values in this interval not displayed.   Recent Labs    08/09/17 1234 08/10/17 0331 09/05/17 1357  AST 55* 50* 38  ALT 28 29 25   ALKPHOS 63 68 87  BILITOT 1.2 1.0 1.5*  PROT 7.2 7.8 7.4  ALBUMIN 3.1* 3.3* 2.9*   Recent Labs    08/23/17 08/25/17 09/05/17 1357 09/06/17 0710 09/07/17 0548  WBC 5.5 5.4 14.2* 17.1* 15.9*  NEUTROABS 3 2 11.8*  --   --   HGB 9.9* 10.7* 11.4* 10.2* 8.9*  HCT 30* 31* 34.5* 30.7* 27.3*  MCV  --   --  80.4 81.0 80.3  PLT 426* 404* 206 179 179   Lab Results  Component Value Date   TSH 1.28 01/08/2017   Lab Results  Component Value Date   HGBA1C 6.0 11/05/2013   Lab Results  Component Value Date   CHOL 202 (H) 01/14/2013   HDL 53.60 01/14/2013   LDLDIRECT 126.6 01/14/2013   TRIG 206.0 (H) 01/14/2013   CHOLHDL 4 01/14/2013    Significant Diagnostic Results in last 30 days:  Dg Chest 2 View  Result Date: 08/09/2017 CLINICAL DATA:  Fever. EXAM: CHEST - 2 VIEW COMPARISON:  08/08/2017 FINDINGS: Left chest wall pacer device is noted with lead in the right atrial appendage and right ventricle. The heart size appears normal. There is no pleural effusion or edema. No airspace opacities. IMPRESSION: 1. No acute cardiopulmonary abnormalities. Electronically Signed   By: Kerby Moors M.D.   On: 08/09/2017 14:48   Ct Head Wo Contrast  Result Date: 09/05/2017 CLINICAL DATA:  [place] in restroom at rehab, states he tripped over his feet, fell onto LEFT side, LEFT frontal hematoma, history CHF, hypertension, chronic inflammatory demyelinating polyneuropathy EXAM: CT HEAD WITHOUT CONTRAST TECHNIQUE: Contiguous axial images were obtained from the base of the skull through the vertex without intravenous contrast. Sagittal and coronal MPR images reconstructed from axial data set. COMPARISON:  08/08/2017 FINDINGS: Brain: Normal ventricular morphology. No midline shift or mass effect. Normal appearance of brain parenchyma. No intracranial hemorrhage,  mass lesion, or evidence of acute infarction. No extra-axial fluid collections. Vascular: No hyperdense vessels. Minimal atherosclerotic calcification of internal carotid arteries at skull base Skull: Calvaria intact.  Small LEFT frontal scalp hematoma. Sinuses/Orbits: Paranasal sinuses and mastoid air cells clear. Intraorbital soft tissue planes clear. Other: N/A IMPRESSION: No acute intracranial abnormalities. Small LEFT frontal scalp hematoma. Electronically Signed   By: Lavonia Dana  M.D.   On: 09/05/2017 13:36   Dg Chest Port 1 View  Result Date: 09/05/2017 CLINICAL DATA:  82 y/o  M; fever.  Recent fall to left side. EXAM: PORTABLE CHEST 1 VIEW COMPARISON:  08/09/2017 chest radiograph FINDINGS: Stable heart size and mediastinal contours are within normal limits. Two lead pacemaker. Both lungs are clear. The visualized skeletal structures are unremarkable. IMPRESSION: No active disease. Electronically Signed   By: Kristine Garbe M.D.   On: 09/05/2017 17:36   Dg Hand Complete Left  Result Date: 09/05/2017 CLINICAL DATA:  Pain following fall EXAM: LEFT HAND - COMPLETE 3+ VIEW COMPARISON:  July 14, 2013 FINDINGS: Frontal, oblique, and lateral views were obtained. There is a comminuted fracture of the proximal to mid aspects of the first metacarpal with major fracture fragments in overall near anatomic alignment. No other fractures are evident. No dislocation. There is osteoarthritic change in the first MCP joint. There is also osteoarthritic change in the fourth and fifth DIP joints. No erosive changes. IMPRESSION: Comminuted fracture proximal to mid first metacarpal with major fracture fragments in near anatomic alignment. No other fracture. No dislocation. 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 Electronically Signed   By: Lowella Grip III M.D.   On: 09/05/2017 13:16   Dg Hip Unilat W Or Wo Pelvis 2-3 Views Left  Result Date:  09/05/2017 CLINICAL DATA:  Pain following fall EXAM: DG HIP (WITH OR WITHOUT PELVIS) 2-3V LEFT COMPARISON:  None. FINDINGS: Frontal pelvis as well as frontal and lateral left hip images were obtained. There is no appreciable fracture or dislocation. There is mild symmetric narrowing of both hip joints. No erosive change. IMPRESSION: Mild symmetric narrowing of both hip joints. No fracture or dislocation. Electronically Signed   By: Lowella Grip III M.D.   On: 09/05/2017 13:17    Assessment/Plan  1. Fall, sequela - head CT negative, sustained left first metacarpal fracture, cast placed 4/28, for rehabilitation with PT and OT, for therapeutic strengthening exercises, fall precautions   2. Urinary tract infection without hematuria, site unspecified - blood culture was negative, urine culture + for Pseudomonas, continue Cipro 500 mg BID X 14 days   3. Coronary artery disease, angina presence unspecified, unspecified vessel or lesion type, unspecified whether native or transplanted heart - no complaints of chest pains, continue ASA 81  Mg daily, Irbesartan 300 mg daily, discontinue Metoprolol succinate ER 25 mg BID,    4. Essential hypertension - continue Irbesartan 300 mg daily, discontinue Metoprolol Succinate ER 25 mg BID, start Metoprolol tartrate 25 mg 1 tab BID, start Metoprolol tartrate 25 mg 1 tab BID   5. Sustained SVT (HCC) - well-controlled, continue Flecainide 150 mg 1/2 tab BID, discontinue Metoprolol Succinate ER 25 mg BID, start Metoprolol tartrate 25 mg 1 tab BID   6. CIDP (chronic inflammatory demyelinating polyneuropathy) (HCC) - has been on IVIG previously   7. Dementia without behavioral disturbance, unspecified dementia type - continue supportive care, fall precautions, continue Memantine 5 mg BID   8. Hypokalemia - replaced, check BMP Lab Results  Component Value Date   K 3.1 (L) 09/07/2017    9. Hypomagnesemia - replaced, check Mg   10. Closed fracture of  first metacarpal, left hand - cast placed on 4/28, follow-up with Dr. Amedeo Plenty, orthopedics, in 2 weeks  11. Urinary retention - will continue with Cipro and will do voiding trial on 5/01, continue Tamsulosin 0.4 mg 2  capsules = 0.8 mg daily  12. Chronic diastolic CHF - continue Lasix 20 mg daily     Family/ staff Communication: Discussed plan of care with patient.  Labs/tests ordered:  CBC, BMP, Mg,   Goals of care:   Short-term care   Durenda Age, NP [facility] [phone] (Monday-Friday 8:00 a.m. - 5:00 p.m.) [phone] (after hours)

## 2017-09-09 ENCOUNTER — Non-Acute Institutional Stay (SKILLED_NURSING_FACILITY): Payer: Medicare Other | Admitting: Internal Medicine

## 2017-09-09 ENCOUNTER — Encounter: Payer: Self-pay | Admitting: Internal Medicine

## 2017-09-09 DIAGNOSIS — A419 Sepsis, unspecified organism: Secondary | ICD-10-CM

## 2017-09-09 DIAGNOSIS — N390 Urinary tract infection, site not specified: Secondary | ICD-10-CM | POA: Diagnosis not present

## 2017-09-09 DIAGNOSIS — R339 Retention of urine, unspecified: Secondary | ICD-10-CM

## 2017-09-09 DIAGNOSIS — R509 Fever, unspecified: Secondary | ICD-10-CM | POA: Diagnosis not present

## 2017-09-09 DIAGNOSIS — W19XXXS Unspecified fall, sequela: Secondary | ICD-10-CM | POA: Diagnosis not present

## 2017-09-09 LAB — BASIC METABOLIC PANEL
BUN: 12 (ref 4–21)
Creatinine: 0.8 (ref 0.6–1.3)
Glucose: 91
Potassium: 3.6 (ref 3.4–5.3)
SODIUM: 138 (ref 137–147)

## 2017-09-09 LAB — CBC AND DIFFERENTIAL
HEMATOCRIT: 29 — AB (ref 41–53)
HEMOGLOBIN: 9.4 — AB (ref 13.5–17.5)
NEUTROS ABS: 6
Platelets: 201 (ref 150–399)
WBC: 9.9

## 2017-09-09 NOTE — Assessment & Plan Note (Addendum)
Leave Foley until [facility] follow-up 09/18/17

## 2017-09-09 NOTE — Progress Notes (Signed)
NURSING HOME LOCATION:  [facility] ROOM NUMBER:  107-A  CODE STATUS:  Full Code  PCP:  Marletta Lor, MD  [place] [address]  This is a [facility] readmission within 30 days  Interim medical record and care since last [place] visit was updated with review of diagnostic studies and change in clinical status since last visit were documented.  HPI: He was rehospitalized 4/26-4/28/2019 having fallen at the SNF.  A Foley catheter had been placed for urinary retention but this was removed 4/25 at the SNF.He attributes the loss of consciousness  to constant stinging in the urethra. He stated he felt himself losing his strength and energy before passing out.  There was no cardiac or neurologic prodrome.  He also had no postural hypotension symptoms.   In  the ED CTA of the head revealed no acute interim abnormalities did reveal a small left frontal scalp hematoma.  Hip films were negative for fracture or dislocation.  He had a comminuted fracture proximal to the mid first metatarsal with major fracture fragments in near anatomic alignment.Dr Amedeo Plenty placed a cast left hand fracture.  He was to be seen in follow-up 2 weeks after discharge. Urinalysis suggested possible UTI, empiric Rocephin was initiated.  This was changed to ciprofloxacin when culture revealed Pseudomonas.  Cipro was to be continued for a total of 2 weeks  Sepsis syndrone secondary to Pseudomonas UTI attacks in context of chronic Foley was diagnosed.  Blood culture was negative. Marland Kitchen He did have urinary retention for which Flomax was continued.  Foley was replaced in the ED.  Patient was to have voiding trial with [facility].  Because of recent admission for probable serotonin syndrome, Lexapro, trazodone, and Zyprexa were not restarted.  Review of systems: After his temperature was documented as 101 today 4/30 signs or symptoms of infection were sought but all answers were  negative. He has been having BM within 1 hour of meals w/o diarrhea.  Constitutional: No fever, significant weight change, fatigue  Eyes: No redness, discharge, pain, vision change ENT/mouth: No nasal congestion,  purulent discharge, earache, change in hearing, sore throat  Cardiovascular: No chest pain, palpitations, paroxysmal nocturnal dyspnea, claudication, edema  Respiratory: No cough, sputum production, hemoptysis, DOE , significant snoring, apnea   Gastrointestinal: No heartburn, dysphagia, abdominal pain, nausea /vomiting, rectal bleeding, melena Genitourinary: No dysuria, hematuria, pyuria, incontinence, nocturia Musculoskeletal: No joint stiffness, joint swelling, weakness, pain Dermatologic: No rash, pruritus, change in appearance of skin Neurologic: No dizziness, headache, recurrent syncope, seizures, numbness, tingling Psychiatric: No significant anxiety, depression, insomnia, anorexia Endocrine: No change in hair/skin/ nails, excessive thirst, excessive hunger, excessive urination  Hematologic/lymphatic: No significant bruising, lymphadenopathy, abnormal bleeding Allergy/immunology: No itchy/watery eyes, significant sneezing, urticaria, angioedema  Physical exam:  Pertinent or positive findings: He was alert and communicative.  Bilateral ptosis present.  Dentition appears extremely healthy.  Patterned beard & mustache present. No meningismus present. There is splitting of the second heart sound.  1+ edema at the sock line.  Skin is hot and dry.  Temperature was verified as 101.  He has a narrow but elongated lipoma which transilluminates in the left inguinal area.  He has no inguinal axillary lymphadenopathy.  There is an abrasion over the left hip which is healing with eschar.  There is no associated evidence of cellulitis or abscess clinically. L hand casted.  General appearance: Adequately nourished; no acute distress, increased work of breathing is present.  Lymphatic: No  lymphadenopathy about the head, neck, axilla. Eyes: No conjunctival inflammation or lid edema is present. There is no scleral icterus. Ears:  External ear exam shows no significant lesions or deformities.   Nose:  External nasal examination shows no deformity or inflammation. Nasal mucosa are pink and moist without lesions, exudates Oral exam:  Lips and gums are healthy appearing. There is no oropharyngeal erythema or exudate. Neck:  No thyromegaly, masses, tenderness noted.    Heart:  Normal rate and regular rhythm. S1 normal without gallop, murmur, click, rub .  Lungs:Chest clear to auscultation without wheezes, rhonchi,rales , rubs. Abdomen:Bowel sounds are normal. Abdomen is soft and nontender with no organomegaly, hernias,masses. GU: deferred  Extremities:  No cyanosis, clubbing  Neurologic exam : Strength equal  in upper & lower extremities Deep tendon reflexes are equal Skin: Warm & dry w/o tenting. No significant lesions or rash.  See summary under each active problem in the Problem List with associated updated therapeutic plan.

## 2017-09-09 NOTE — Patient Instructions (Signed)
See assessment and plan under each diagnosis in the problem list and acutely for this visit 

## 2017-09-09 NOTE — Assessment & Plan Note (Signed)
Continue Cipro until 09/21/2017 Monitor for any other nidus of infection

## 2017-09-10 LAB — CULTURE, BLOOD (ROUTINE X 2)
Culture: NO GROWTH
Culture: NO GROWTH
Special Requests: ADEQUATE
Special Requests: ADEQUATE

## 2017-09-10 NOTE — Assessment & Plan Note (Signed)
If fever persists ;Urology reassessment to R/O bladder issue as predispositio to UTI (Ex bladder tumor,etc) Monitor for C dif as some pc BM changes w/o diarrhea

## 2017-09-12 ENCOUNTER — Encounter: Payer: Self-pay | Admitting: Adult Health

## 2017-09-12 ENCOUNTER — Non-Acute Institutional Stay (SKILLED_NURSING_FACILITY): Payer: Medicare Other | Admitting: Adult Health

## 2017-09-12 DIAGNOSIS — R339 Retention of urine, unspecified: Secondary | ICD-10-CM

## 2017-09-12 DIAGNOSIS — I471 Supraventricular tachycardia, unspecified: Secondary | ICD-10-CM

## 2017-09-12 DIAGNOSIS — N390 Urinary tract infection, site not specified: Secondary | ICD-10-CM | POA: Diagnosis not present

## 2017-09-12 DIAGNOSIS — G6181 Chronic inflammatory demyelinating polyneuritis: Secondary | ICD-10-CM | POA: Diagnosis not present

## 2017-09-12 DIAGNOSIS — S62292S Other fracture of first metacarpal bone, left hand, sequela: Secondary | ICD-10-CM | POA: Diagnosis not present

## 2017-09-12 DIAGNOSIS — E519 Thiamine deficiency, unspecified: Secondary | ICD-10-CM

## 2017-09-12 DIAGNOSIS — R5381 Other malaise: Secondary | ICD-10-CM

## 2017-09-12 DIAGNOSIS — F0390 Unspecified dementia without behavioral disturbance: Secondary | ICD-10-CM | POA: Diagnosis not present

## 2017-09-12 DIAGNOSIS — I5032 Chronic diastolic (congestive) heart failure: Secondary | ICD-10-CM

## 2017-09-12 DIAGNOSIS — I10 Essential (primary) hypertension: Secondary | ICD-10-CM

## 2017-09-12 NOTE — Progress Notes (Signed)
Location:  [facility] Room Number: 107-A Place of Service:  SNF (31) Provider:  Durenda Age, NP  Patient Care Team: Marletta Lor, MD as PCP - General [name], MD (Cardiology) [name], MD (Cardiology)  Extended Emergency Contact Information Primary Emergency Contact: [facility] Address: [address]          [place], [place] [address] Johnnette Litter of [place] Phone: [phone] Relation: Daughter Secondary Emergency Contact: [place] of [place] Phone: [phone] Relation: Niece  Code Status:  Full Code  Goals of care: Advanced Directive information Advanced Directives 09/05/2017  Does Patient Have a Medical Advance Directive? Yes  Type of Advance Directive [place]  Does patient want to make changes to medical advance directive? No - Patient declined  Copy of [place] in Chart? No - copy requested  Would patient like information on creating a medical advance directive? -  Pre-existing out of facility DNR order (yellow form or pink MOST form) -     Chief Complaint  Patient presents with  . Discharge Note    Patient is transferring to another facility on 09/15/17    HPI:  Pt is a 82 y.o. male seen today for a discharge visit.  He is transferring to a [facility] facility on 09/15/17.   He has been admitted to [facility] on 09/07/17 from [facility] admission dates 09/05/17 - 09/07/17 who fell at home and was diagnosed with UTI. He was started on Rocephin. Urine culture was positive for Pseudomonas so antibiotic was changed to Cipro. He was having a short-term rehabilitation @ [place] before going to the hospital with hospitalization dates 08/09/17 to 08/16/17 due to toxic metabolic encephalopathy which is thought to be related to serotonin syndrome and cellulitis of left arm. He was treated with Keflex. He has a  PMH of asthma, chronic inflammatory demyelinating polyneuropathy, chronic diastolic CHF with pacemaker, and nephrolithiasis.   He was seen in the room today. Explained to him regarding his foley catheter which needs to be kept while he is on antibiotic. He has a urology appointment on May 9th and would still be on Ciprofloxacin.  Patient was admitted to this facility for short-term rehabilitation after the patient's recent hospitalization.  Patient has completed SNF rehabilitation and therapy has cleared the patient for discharge.    Past Medical History:  Diagnosis Date  . Asthma   . BENIGN PROSTATIC HYPERTROPHY, HX OF 10/25/2008  . BRADYCARDIA 2005  . Chronic diastolic congestive heart failure ([place]) 06/05/2016  . CIDP (chronic inflammatory demyelinating polyneuropathy) ([place])   . HYPERTENSION 11/21/2006  . Iron deficiency anemia, unspecified 04/19/2013  . 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 PACEMAKER, PERMANENT 2005   Gen change 2014 Medtronic Adaptic L dual-chamber pacemaker, serial [id number]   . SVT (supraventricular tachycardia) ([place])   . TOBACCO ABUSE 10/25/2008   Quit 2012   Past Surgical History:  Procedure Laterality Date  . COLONOSCOPY  [id number]  . PACEMAKER INSERTION  2005  . PERMANENT PACEMAKER GENERATOR CHANGE N/A 06/19/2012   Procedure: PERMANENT PACEMAKER GENERATOR CHANGE;  Surgeon: [name], MD; Medtronic Adaptic L dual-chamber pacemaker, serial #[id number]    . SKIN GRAFT Right 1962   wrist    Allergies  Allergen Reactions  . Lexapro [Escitalopram Oxalate]     3/30-08/16/17 ? Serotonin Syndrome    Outpatient Encounter Medications as of 09/12/2017  Medication Sig  . acetaminophen (TYLENOL)  325 MG tablet Take 650 mg by mouth every 6 (six) hours as needed for mild pain.  Marland Kitchen aspirin EC 81 MG EC tablet Take 1 tablet (81 mg total) by mouth daily.  . Cholecalciferol (VITAMIN D3) 50000 units CAPS Take 1 capsule by mouth once a week.  . ciprofloxacin  (CIPRO) 500 MG tablet Take 1 tablet (500 mg total) by mouth 2 (two) times daily for 14 days.  . flecainide (TAMBOCOR) 150 MG tablet TAKE 1/2 TABLET BY MOUTH TWICE DAILY  . furosemide (LASIX) 20 MG tablet Take 1 tablet (20 mg total) by mouth daily.  . Garlic 4782 MG CAPS Take 2,000 mg by mouth daily.   . irbesartan (AVAPRO) 300 MG tablet TAKE 1 TABLET(300 MG) BY MOUTH DAILY  . Melatonin 3 MG TABS Take 1 tablet (3 mg total) by mouth at bedtime.  . memantine (NAMENDA) 5 MG tablet Take 5 mg by mouth 2 (two) times daily.  . metoprolol tartrate (LOPRESSOR) 25 MG tablet Take 1 tablet (25 mg total) by mouth 2 (two) times daily.  Marland Kitchen NUTRITIONAL SUPPLEMENT LIQD Take 120 mLs by mouth 2 (two) times daily. MedPass  . polyethylene glycol (MIRALAX / GLYCOLAX) packet Take 17 g by mouth daily.  Marland Kitchen saccharomyces boulardii (FLORASTOR) 250 MG capsule Take 250 mg by mouth 2 (two) times daily.  . tamsulosin (FLOMAX) 0.4 MG CAPS capsule Take 2 capsules (0.8 mg total) by mouth daily.  . vitamin B-12 (CYANOCOBALAMIN) 500 MCG tablet Take 1,000 mcg by mouth daily.   No facility-administered encounter medications on file as of 09/12/2017.     Review of Systems  GENERAL: No change in appetite, no fatigue, no weight changes, no fever, chills or weakness MOUTH and THROAT: Denies oral discomfort, gingival pain or bleeding, pain from teeth or hoarseness   RESPIRATORY: no cough, SOB, DOE, wheezing, hemoptysis CARDIAC: No chest pain, edema or palpitations GI: No abdominal pain, diarrhea, constipation, heart burn, nausea or vomiting PSYCHIATRIC: Denies feelings of depression or anxiety. No report of hallucinations, insomnia, paranoia, or agitation   Immunization History  Administered Date(s) Administered  . Influenza Whole 02/10/2010  . Influenza, High Dose Seasonal PF 01/30/2015, 04/30/2016, 01/28/2017  . Influenza,inj,Quad PF,6+ Mos 01/19/2013  . Influenza-Unspecified 02/03/2014  . PPD Test 01/28/2017  . Pneumococcal  Conjugate-13 08/08/2014  . Pneumococcal Polysaccharide-23 01/14/2013  . Tetanus 01/14/2013   Pertinent  Health Maintenance Due  Topic Date Due  . INFLUENZA VACCINE  12/11/2017  . PNA vac Low Risk Adult  Completed   Fall Risk  05/27/2017 01/08/2017 01/06/2017 01/06/2017 12/09/2016  Falls in the past year? Yes Yes Yes Yes Yes  Comment - - - - -  Number falls in past yr: 1 2 or more 2 or more 2 or more 1  Injury with Fall? No No No No No  Risk Factor Category  - High Fall Risk - - -  Risk for fall due to : Impaired mobility;Impaired balance/gait Impaired balance/gait;Impaired mobility Impaired balance/gait;Other (Comment) - Impaired balance/gait;Impaired mobility  Risk for fall due to: Comment - - - - -  Follow up Falls evaluation completed;Education provided;Falls prevention discussed Falls evaluation completed;Education provided;Falls prevention discussed - - Falls evaluation completed;Education provided;Falls prevention discussed     Vitals:   09/12/17 0734  BP: 120/68  Pulse: 70  Resp: 18  Temp: 97.7 F (36.5 C)  TempSrc: Oral  SpO2: 97%  Weight: 176 lb 3.2 oz (79.9 kg)  Height: 5\' 10"  (1.778 m)   Body mass index is  25.28 kg/m.  Physical Exam  GENERAL APPEARANCE: Well nourished. In no acute distress. Normal body habitus SKIN:  Skin is warm and dry.  MOUTH and THROAT: Lips are without lesions. Oral mucosa is moist and without lesions. Tongue is normal in shape, size, and color and without lesions RESPIRATORY: Breathing is even & unlabored, BS CTAB CARDIAC: RRR, no murmur,no extra heart sounds, no edema GI: Abdomen soft, normal BS, no masses, no tenderness GU:  Has foley catheter attached to urine bag draining clear yellowish urine EXTREMITIES:  Able to move X 4 extremities PSYCHIATRIC: Alert and oriented X 3. Affect and behavior are appropriate  Labs reviewed: Recent Labs    08/03/17 0436 08/05/17 0542  09/05/17 1357 09/06/17 0710 09/07/17 0548  NA 136 140   < >  134* 135 134*  K 3.9 3.7   < > 3.6 3.4* 3.1*  CL 102 105   < > 99* 102 105  CO2 25 25   < > 22 24 24   GLUCOSE 106* 98   < > 120* 101* 95  BUN 16 20   < > 14 16 17   CREATININE 0.95 1.27*   < > 1.33* 1.21 1.04  CALCIUM 9.0 8.6*   < > 9.0 8.1* 7.9*  MG 1.7 1.8  --   --   --  1.6*   < > = values in this interval not displayed.   Recent Labs    08/09/17 1234 08/10/17 0331 09/05/17 1357  AST 55* 50* 38  ALT 28 29 25   ALKPHOS 63 68 87  BILITOT 1.2 1.0 1.5*  PROT 7.2 7.8 7.4  ALBUMIN 3.1* 3.3* 2.9*   Recent Labs    08/23/17 08/25/17 09/05/17 1357 09/06/17 0710 09/07/17 0548  WBC 5.5 5.4 14.2* 17.1* 15.9*  NEUTROABS 3 2 11.8*  --   --   HGB 9.9* 10.7* 11.4* 10.2* 8.9*  HCT 30* 31* 34.5* 30.7* 27.3*  MCV  --   --  80.4 81.0 80.3  PLT 426* 404* 206 179 179   Lab Results  Component Value Date   TSH 1.28 01/08/2017   Lab Results  Component Value Date   HGBA1C 6.0 11/05/2013   Lab Results  Component Value Date   CHOL 202 (H) 01/14/2013   HDL 53.60 01/14/2013   LDLDIRECT 126.6 01/14/2013   TRIG 206.0 (H) 01/14/2013   CHOLHDL 4 01/14/2013    Significant Diagnostic Results in last 30 days:  Ct Head Wo Contrast  Result Date: 09/05/2017 CLINICAL DATA:  [place] in restroom at rehab, states he tripped over his feet, fell onto LEFT side, LEFT frontal hematoma, history CHF, hypertension, chronic inflammatory demyelinating polyneuropathy EXAM: CT HEAD WITHOUT CONTRAST TECHNIQUE: Contiguous axial images were obtained from the base of the skull through the vertex without intravenous contrast. Sagittal and coronal MPR images reconstructed from axial data set. COMPARISON:  08/08/2017 FINDINGS: Brain: Normal ventricular morphology. No midline shift or mass effect. Normal appearance of brain parenchyma. No intracranial hemorrhage, mass lesion, or evidence of acute infarction. No extra-axial fluid collections. Vascular: No hyperdense vessels. Minimal atherosclerotic calcification of internal  carotid arteries at skull base Skull: Calvaria intact.  Small LEFT frontal scalp hematoma. Sinuses/Orbits: Paranasal sinuses and mastoid air cells clear. Intraorbital soft tissue planes clear. Other: N/A IMPRESSION: No acute intracranial abnormalities. Small LEFT frontal scalp hematoma. Electronically Signed   By: Lavonia Dana M.D.   On: 09/05/2017 13:36   Dg Chest Port 1 View  Result Date: 09/05/2017 CLINICAL DATA:  82 y/o  M; fever.  Recent fall to left side. EXAM: PORTABLE CHEST 1 VIEW COMPARISON:  08/09/2017 chest radiograph FINDINGS: Stable heart size and mediastinal contours are within normal limits. Two lead pacemaker. Both lungs are clear. The visualized skeletal structures are unremarkable. IMPRESSION: No active disease. Electronically Signed   By: Kristine Garbe M.D.   On: 09/05/2017 17:36   Dg Hand Complete Left  Result Date: 09/05/2017 CLINICAL DATA:  Pain following fall EXAM: LEFT HAND - COMPLETE 3+ VIEW COMPARISON:  July 14, 2013 FINDINGS: Frontal, oblique, and lateral views were obtained. There is a comminuted fracture of the proximal to mid aspects of the first metacarpal with major fracture fragments in overall near anatomic alignment. No other fractures are evident. No dislocation. There is osteoarthritic change in the first MCP joint. There is also osteoarthritic change in the fourth and fifth DIP joints. No erosive changes. IMPRESSION: Comminuted fracture proximal to mid first metacarpal with major fracture fragments in near anatomic alignment. No other fracture. No dislocation. 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 Electronically Signed   By: Lowella Grip III M.D.   On: 09/05/2017 13:16   Dg Hip Unilat W Or Wo Pelvis 2-3 Views Left  Result Date: 09/05/2017 CLINICAL DATA:  Pain following fall EXAM: DG HIP (WITH OR WITHOUT PELVIS) 2-3V LEFT COMPARISON:  None. FINDINGS: Frontal pelvis as well as frontal and lateral left  hip images were obtained. There is no appreciable fracture or dislocation. There is mild symmetric narrowing of both hip joints. No erosive change. IMPRESSION: Mild symmetric narrowing of both hip joints. No fracture or dislocation. Electronically Signed   By: Lowella Grip III M.D.   On: 09/05/2017 13:17    Assessment/Plan  1. Physical deconditioning - for Home health PT and OT, for therapeutic strengthening exercises, fall precautions   2. Urinary tract infection without hematuria, site unspecified - continue Ciprofloxacin 500 mg BID till 09/21/17 and Florastor 250 mg PO BID    3. Urinary retention - continue Tamsulosin 0.4 mg 1 capsule daily, has foley catheter, will follow up with urology, on May 9th   4. Sustained SVT (HCC) - continue  Metoprolol tartrate 25 mg 1 tab BID, Flecainide 50 mg 1 1/2 tab = 75 mg BID   5. CIDP (chronic inflammatory demyelinating polyneuropathy) (HCC) - has been on IVIG previously   6. Chronic diastolic congestive heart failure (HCC) - continue Lasix 40 mg daily   7. Essential hypertension - well-controlled, continue Metoprolol tartrate 25 mg 1 tab BID, Irbesartan 300 mg 1 tab daily   8. Dementia without behavioral disturbance, unspecified dementia type - continue Memantine 5 mg 1 tab BID   9. Thiamine deficiency - continue Vitamin B12 1,000 mcg 1 tab daily   10. Closed nondisplaced fracture of other part of first metacarpal bone of left hand, sequela - cast was placed on 4/28, follow-up with Dr. Nicola Girt, in 2 weeks     I have filled out patient's discharge paperwork and written prescriptions.  Patient will receive home health PT, OT, and Nursing.  DME provided:  None  Total discharge time: Greater than 30 minutes Greater than 50% was spent in counseling and coordination of care.   Discharge time involved coordination of the discharge process with social worker, nursing staff and therapy department. Medical justification for  home health services verified.   Durenda Age, NP [facility] [phone] (Monday-Friday 8:00 a.m. -  5:00 p.m.) [phone] (after hours)

## 2017-09-15 ENCOUNTER — Ambulatory Visit: Payer: Medicare Other | Admitting: Internal Medicine

## 2017-09-17 DIAGNOSIS — N390 Urinary tract infection, site not specified: Secondary | ICD-10-CM | POA: Diagnosis not present

## 2017-09-17 DIAGNOSIS — I110 Hypertensive heart disease with heart failure: Secondary | ICD-10-CM | POA: Diagnosis not present

## 2017-09-17 DIAGNOSIS — I2510 Atherosclerotic heart disease of native coronary artery without angina pectoris: Secondary | ICD-10-CM | POA: Diagnosis not present

## 2017-09-17 DIAGNOSIS — F0390 Unspecified dementia without behavioral disturbance: Secondary | ICD-10-CM | POA: Diagnosis not present

## 2017-09-17 DIAGNOSIS — N401 Enlarged prostate with lower urinary tract symptoms: Secondary | ICD-10-CM | POA: Diagnosis not present

## 2017-09-17 DIAGNOSIS — R339 Retention of urine, unspecified: Secondary | ICD-10-CM | POA: Diagnosis not present

## 2017-09-17 DIAGNOSIS — I5030 Unspecified diastolic (congestive) heart failure: Secondary | ICD-10-CM | POA: Diagnosis not present

## 2017-09-17 DIAGNOSIS — F5101 Primary insomnia: Secondary | ICD-10-CM | POA: Diagnosis not present

## 2017-09-17 DIAGNOSIS — E559 Vitamin D deficiency, unspecified: Secondary | ICD-10-CM | POA: Diagnosis not present

## 2017-09-17 DIAGNOSIS — Z950 Presence of cardiac pacemaker: Secondary | ICD-10-CM | POA: Diagnosis not present

## 2017-09-17 DIAGNOSIS — Z792 Long term (current) use of antibiotics: Secondary | ICD-10-CM | POA: Diagnosis not present

## 2017-09-17 DIAGNOSIS — I82611 Acute embolism and thrombosis of superficial veins of right upper extremity: Secondary | ICD-10-CM | POA: Diagnosis not present

## 2017-09-17 DIAGNOSIS — L03114 Cellulitis of left upper limb: Secondary | ICD-10-CM | POA: Diagnosis not present

## 2017-09-17 DIAGNOSIS — I482 Chronic atrial fibrillation: Secondary | ICD-10-CM | POA: Diagnosis not present

## 2017-09-17 DIAGNOSIS — J45909 Unspecified asthma, uncomplicated: Secondary | ICD-10-CM | POA: Diagnosis not present

## 2017-09-17 DIAGNOSIS — D518 Other vitamin B12 deficiency anemias: Secondary | ICD-10-CM | POA: Diagnosis not present

## 2017-09-17 DIAGNOSIS — I10 Essential (primary) hypertension: Secondary | ICD-10-CM | POA: Diagnosis not present

## 2017-09-17 DIAGNOSIS — F17211 Nicotine dependence, cigarettes, in remission: Secondary | ICD-10-CM | POA: Diagnosis not present

## 2017-09-17 DIAGNOSIS — G6181 Chronic inflammatory demyelinating polyneuritis: Secondary | ICD-10-CM | POA: Diagnosis not present

## 2017-09-17 DIAGNOSIS — Z466 Encounter for fitting and adjustment of urinary device: Secondary | ICD-10-CM | POA: Diagnosis not present

## 2017-09-17 DIAGNOSIS — I5033 Acute on chronic diastolic (congestive) heart failure: Secondary | ICD-10-CM | POA: Diagnosis not present

## 2017-09-17 DIAGNOSIS — Z9181 History of falling: Secondary | ICD-10-CM | POA: Diagnosis not present

## 2017-09-17 DIAGNOSIS — K5900 Constipation, unspecified: Secondary | ICD-10-CM | POA: Diagnosis not present

## 2017-09-17 DIAGNOSIS — R338 Other retention of urine: Secondary | ICD-10-CM | POA: Diagnosis not present

## 2017-09-18 ENCOUNTER — Telehealth: Payer: Self-pay | Admitting: Neurology

## 2017-09-18 DIAGNOSIS — R338 Other retention of urine: Secondary | ICD-10-CM | POA: Diagnosis not present

## 2017-09-18 NOTE — Telephone Encounter (Signed)
Patient's daughter called and wanted to let Dr. Posey Pronto know that Jonathon [name] is in an assisted [place] at [facility].  She had to reschedule his appointment from  this month to September. Thanks

## 2017-09-19 DIAGNOSIS — I110 Hypertensive heart disease with heart failure: Secondary | ICD-10-CM | POA: Diagnosis not present

## 2017-09-19 DIAGNOSIS — R338 Other retention of urine: Secondary | ICD-10-CM | POA: Diagnosis not present

## 2017-09-19 DIAGNOSIS — N401 Enlarged prostate with lower urinary tract symptoms: Secondary | ICD-10-CM | POA: Diagnosis not present

## 2017-09-19 DIAGNOSIS — I2510 Atherosclerotic heart disease of native coronary artery without angina pectoris: Secondary | ICD-10-CM | POA: Diagnosis not present

## 2017-09-19 DIAGNOSIS — G6181 Chronic inflammatory demyelinating polyneuritis: Secondary | ICD-10-CM | POA: Diagnosis not present

## 2017-09-19 DIAGNOSIS — I5033 Acute on chronic diastolic (congestive) heart failure: Secondary | ICD-10-CM | POA: Diagnosis not present

## 2017-09-19 NOTE — Telephone Encounter (Signed)
FYI

## 2017-09-22 DIAGNOSIS — S62242A Displaced fracture of shaft of first metacarpal bone, left hand, initial encounter for closed fracture: Secondary | ICD-10-CM | POA: Diagnosis not present

## 2017-09-22 DIAGNOSIS — S62242D Displaced fracture of shaft of first metacarpal bone, left hand, subsequent encounter for fracture with routine healing: Secondary | ICD-10-CM | POA: Diagnosis not present

## 2017-09-22 DIAGNOSIS — M79642 Pain in left hand: Secondary | ICD-10-CM | POA: Diagnosis not present

## 2017-09-23 DIAGNOSIS — R338 Other retention of urine: Secondary | ICD-10-CM | POA: Diagnosis not present

## 2017-09-23 DIAGNOSIS — N401 Enlarged prostate with lower urinary tract symptoms: Secondary | ICD-10-CM | POA: Diagnosis not present

## 2017-09-23 DIAGNOSIS — I110 Hypertensive heart disease with heart failure: Secondary | ICD-10-CM | POA: Diagnosis not present

## 2017-09-23 DIAGNOSIS — I2510 Atherosclerotic heart disease of native coronary artery without angina pectoris: Secondary | ICD-10-CM | POA: Diagnosis not present

## 2017-09-23 DIAGNOSIS — G6181 Chronic inflammatory demyelinating polyneuritis: Secondary | ICD-10-CM | POA: Diagnosis not present

## 2017-09-23 DIAGNOSIS — I5033 Acute on chronic diastolic (congestive) heart failure: Secondary | ICD-10-CM | POA: Diagnosis not present

## 2017-09-24 DIAGNOSIS — I2510 Atherosclerotic heart disease of native coronary artery without angina pectoris: Secondary | ICD-10-CM | POA: Diagnosis not present

## 2017-09-24 DIAGNOSIS — N401 Enlarged prostate with lower urinary tract symptoms: Secondary | ICD-10-CM | POA: Diagnosis not present

## 2017-09-24 DIAGNOSIS — G6181 Chronic inflammatory demyelinating polyneuritis: Secondary | ICD-10-CM | POA: Diagnosis not present

## 2017-09-24 DIAGNOSIS — R338 Other retention of urine: Secondary | ICD-10-CM | POA: Diagnosis not present

## 2017-09-24 DIAGNOSIS — I5033 Acute on chronic diastolic (congestive) heart failure: Secondary | ICD-10-CM | POA: Diagnosis not present

## 2017-09-24 DIAGNOSIS — I110 Hypertensive heart disease with heart failure: Secondary | ICD-10-CM | POA: Diagnosis not present

## 2017-09-25 DIAGNOSIS — I2510 Atherosclerotic heart disease of native coronary artery without angina pectoris: Secondary | ICD-10-CM | POA: Diagnosis not present

## 2017-09-25 DIAGNOSIS — G6181 Chronic inflammatory demyelinating polyneuritis: Secondary | ICD-10-CM | POA: Diagnosis not present

## 2017-09-25 DIAGNOSIS — I5033 Acute on chronic diastolic (congestive) heart failure: Secondary | ICD-10-CM | POA: Diagnosis not present

## 2017-09-25 DIAGNOSIS — N401 Enlarged prostate with lower urinary tract symptoms: Secondary | ICD-10-CM | POA: Diagnosis not present

## 2017-09-25 DIAGNOSIS — R338 Other retention of urine: Secondary | ICD-10-CM | POA: Diagnosis not present

## 2017-09-25 DIAGNOSIS — I110 Hypertensive heart disease with heart failure: Secondary | ICD-10-CM | POA: Diagnosis not present

## 2017-09-26 ENCOUNTER — Ambulatory Visit: Payer: Medicare Other | Admitting: Neurology

## 2017-09-26 DIAGNOSIS — R351 Nocturia: Secondary | ICD-10-CM | POA: Diagnosis not present

## 2017-09-26 DIAGNOSIS — N401 Enlarged prostate with lower urinary tract symptoms: Secondary | ICD-10-CM | POA: Diagnosis not present

## 2017-09-26 DIAGNOSIS — R338 Other retention of urine: Secondary | ICD-10-CM | POA: Diagnosis not present

## 2017-09-26 DIAGNOSIS — I2510 Atherosclerotic heart disease of native coronary artery without angina pectoris: Secondary | ICD-10-CM | POA: Diagnosis not present

## 2017-09-26 DIAGNOSIS — G6181 Chronic inflammatory demyelinating polyneuritis: Secondary | ICD-10-CM | POA: Diagnosis not present

## 2017-09-26 DIAGNOSIS — I110 Hypertensive heart disease with heart failure: Secondary | ICD-10-CM | POA: Diagnosis not present

## 2017-09-26 DIAGNOSIS — I5033 Acute on chronic diastolic (congestive) heart failure: Secondary | ICD-10-CM | POA: Diagnosis not present

## 2017-09-30 DIAGNOSIS — I2510 Atherosclerotic heart disease of native coronary artery without angina pectoris: Secondary | ICD-10-CM | POA: Diagnosis not present

## 2017-09-30 DIAGNOSIS — G6181 Chronic inflammatory demyelinating polyneuritis: Secondary | ICD-10-CM | POA: Diagnosis not present

## 2017-09-30 DIAGNOSIS — R338 Other retention of urine: Secondary | ICD-10-CM | POA: Diagnosis not present

## 2017-09-30 DIAGNOSIS — I5033 Acute on chronic diastolic (congestive) heart failure: Secondary | ICD-10-CM | POA: Diagnosis not present

## 2017-09-30 DIAGNOSIS — N401 Enlarged prostate with lower urinary tract symptoms: Secondary | ICD-10-CM | POA: Diagnosis not present

## 2017-09-30 DIAGNOSIS — I110 Hypertensive heart disease with heart failure: Secondary | ICD-10-CM | POA: Diagnosis not present

## 2017-10-01 DIAGNOSIS — N401 Enlarged prostate with lower urinary tract symptoms: Secondary | ICD-10-CM | POA: Diagnosis not present

## 2017-10-01 DIAGNOSIS — I5033 Acute on chronic diastolic (congestive) heart failure: Secondary | ICD-10-CM | POA: Diagnosis not present

## 2017-10-01 DIAGNOSIS — I110 Hypertensive heart disease with heart failure: Secondary | ICD-10-CM | POA: Diagnosis not present

## 2017-10-01 DIAGNOSIS — R338 Other retention of urine: Secondary | ICD-10-CM | POA: Diagnosis not present

## 2017-10-01 DIAGNOSIS — G6181 Chronic inflammatory demyelinating polyneuritis: Secondary | ICD-10-CM | POA: Diagnosis not present

## 2017-10-01 DIAGNOSIS — I2510 Atherosclerotic heart disease of native coronary artery without angina pectoris: Secondary | ICD-10-CM | POA: Diagnosis not present

## 2017-10-02 DIAGNOSIS — I110 Hypertensive heart disease with heart failure: Secondary | ICD-10-CM | POA: Diagnosis not present

## 2017-10-02 DIAGNOSIS — N401 Enlarged prostate with lower urinary tract symptoms: Secondary | ICD-10-CM | POA: Diagnosis not present

## 2017-10-02 DIAGNOSIS — R338 Other retention of urine: Secondary | ICD-10-CM | POA: Diagnosis not present

## 2017-10-02 DIAGNOSIS — G6181 Chronic inflammatory demyelinating polyneuritis: Secondary | ICD-10-CM | POA: Diagnosis not present

## 2017-10-02 DIAGNOSIS — I2510 Atherosclerotic heart disease of native coronary artery without angina pectoris: Secondary | ICD-10-CM | POA: Diagnosis not present

## 2017-10-02 DIAGNOSIS — I5033 Acute on chronic diastolic (congestive) heart failure: Secondary | ICD-10-CM | POA: Diagnosis not present

## 2017-10-07 DIAGNOSIS — I5033 Acute on chronic diastolic (congestive) heart failure: Secondary | ICD-10-CM | POA: Diagnosis not present

## 2017-10-07 DIAGNOSIS — N401 Enlarged prostate with lower urinary tract symptoms: Secondary | ICD-10-CM | POA: Diagnosis not present

## 2017-10-07 DIAGNOSIS — R338 Other retention of urine: Secondary | ICD-10-CM | POA: Diagnosis not present

## 2017-10-07 DIAGNOSIS — I2510 Atherosclerotic heart disease of native coronary artery without angina pectoris: Secondary | ICD-10-CM | POA: Diagnosis not present

## 2017-10-07 DIAGNOSIS — G6181 Chronic inflammatory demyelinating polyneuritis: Secondary | ICD-10-CM | POA: Diagnosis not present

## 2017-10-07 DIAGNOSIS — I110 Hypertensive heart disease with heart failure: Secondary | ICD-10-CM | POA: Diagnosis not present

## 2017-10-09 DIAGNOSIS — N401 Enlarged prostate with lower urinary tract symptoms: Secondary | ICD-10-CM | POA: Diagnosis not present

## 2017-10-09 DIAGNOSIS — I2510 Atherosclerotic heart disease of native coronary artery without angina pectoris: Secondary | ICD-10-CM | POA: Diagnosis not present

## 2017-10-09 DIAGNOSIS — R338 Other retention of urine: Secondary | ICD-10-CM | POA: Diagnosis not present

## 2017-10-09 DIAGNOSIS — G6181 Chronic inflammatory demyelinating polyneuritis: Secondary | ICD-10-CM | POA: Diagnosis not present

## 2017-10-09 DIAGNOSIS — I5033 Acute on chronic diastolic (congestive) heart failure: Secondary | ICD-10-CM | POA: Diagnosis not present

## 2017-10-09 DIAGNOSIS — I110 Hypertensive heart disease with heart failure: Secondary | ICD-10-CM | POA: Diagnosis not present

## 2017-10-10 DIAGNOSIS — I482 Chronic atrial fibrillation: Secondary | ICD-10-CM | POA: Diagnosis not present

## 2017-10-10 DIAGNOSIS — N401 Enlarged prostate with lower urinary tract symptoms: Secondary | ICD-10-CM | POA: Diagnosis not present

## 2017-10-13 DIAGNOSIS — I110 Hypertensive heart disease with heart failure: Secondary | ICD-10-CM | POA: Diagnosis not present

## 2017-10-13 DIAGNOSIS — N401 Enlarged prostate with lower urinary tract symptoms: Secondary | ICD-10-CM | POA: Diagnosis not present

## 2017-10-13 DIAGNOSIS — R338 Other retention of urine: Secondary | ICD-10-CM | POA: Diagnosis not present

## 2017-10-13 DIAGNOSIS — M79642 Pain in left hand: Secondary | ICD-10-CM | POA: Diagnosis not present

## 2017-10-13 DIAGNOSIS — G6181 Chronic inflammatory demyelinating polyneuritis: Secondary | ICD-10-CM | POA: Diagnosis not present

## 2017-10-13 DIAGNOSIS — S62309D Unspecified fracture of unspecified metacarpal bone, subsequent encounter for fracture with routine healing: Secondary | ICD-10-CM | POA: Diagnosis not present

## 2017-10-13 DIAGNOSIS — I2510 Atherosclerotic heart disease of native coronary artery without angina pectoris: Secondary | ICD-10-CM | POA: Diagnosis not present

## 2017-10-13 DIAGNOSIS — I5033 Acute on chronic diastolic (congestive) heart failure: Secondary | ICD-10-CM | POA: Diagnosis not present

## 2017-10-15 DIAGNOSIS — I2510 Atherosclerotic heart disease of native coronary artery without angina pectoris: Secondary | ICD-10-CM | POA: Diagnosis not present

## 2017-10-15 DIAGNOSIS — I110 Hypertensive heart disease with heart failure: Secondary | ICD-10-CM | POA: Diagnosis not present

## 2017-10-15 DIAGNOSIS — G6181 Chronic inflammatory demyelinating polyneuritis: Secondary | ICD-10-CM | POA: Diagnosis not present

## 2017-10-15 DIAGNOSIS — I5033 Acute on chronic diastolic (congestive) heart failure: Secondary | ICD-10-CM | POA: Diagnosis not present

## 2017-10-15 DIAGNOSIS — N401 Enlarged prostate with lower urinary tract symptoms: Secondary | ICD-10-CM | POA: Diagnosis not present

## 2017-10-15 DIAGNOSIS — R338 Other retention of urine: Secondary | ICD-10-CM | POA: Diagnosis not present

## 2017-10-21 DIAGNOSIS — G6181 Chronic inflammatory demyelinating polyneuritis: Secondary | ICD-10-CM | POA: Diagnosis not present

## 2017-10-21 DIAGNOSIS — R338 Other retention of urine: Secondary | ICD-10-CM | POA: Diagnosis not present

## 2017-10-21 DIAGNOSIS — I110 Hypertensive heart disease with heart failure: Secondary | ICD-10-CM | POA: Diagnosis not present

## 2017-10-21 DIAGNOSIS — I2510 Atherosclerotic heart disease of native coronary artery without angina pectoris: Secondary | ICD-10-CM | POA: Diagnosis not present

## 2017-10-21 DIAGNOSIS — N401 Enlarged prostate with lower urinary tract symptoms: Secondary | ICD-10-CM | POA: Diagnosis not present

## 2017-10-21 DIAGNOSIS — I5033 Acute on chronic diastolic (congestive) heart failure: Secondary | ICD-10-CM | POA: Diagnosis not present

## 2017-10-23 DIAGNOSIS — I110 Hypertensive heart disease with heart failure: Secondary | ICD-10-CM | POA: Diagnosis not present

## 2017-10-23 DIAGNOSIS — N401 Enlarged prostate with lower urinary tract symptoms: Secondary | ICD-10-CM | POA: Diagnosis not present

## 2017-10-23 DIAGNOSIS — G6181 Chronic inflammatory demyelinating polyneuritis: Secondary | ICD-10-CM | POA: Diagnosis not present

## 2017-10-23 DIAGNOSIS — I2510 Atherosclerotic heart disease of native coronary artery without angina pectoris: Secondary | ICD-10-CM | POA: Diagnosis not present

## 2017-10-23 DIAGNOSIS — R338 Other retention of urine: Secondary | ICD-10-CM | POA: Diagnosis not present

## 2017-10-23 DIAGNOSIS — I5033 Acute on chronic diastolic (congestive) heart failure: Secondary | ICD-10-CM | POA: Diagnosis not present

## 2017-10-29 DIAGNOSIS — M79642 Pain in left hand: Secondary | ICD-10-CM | POA: Diagnosis not present

## 2017-10-29 DIAGNOSIS — B351 Tinea unguium: Secondary | ICD-10-CM | POA: Diagnosis not present

## 2017-10-29 DIAGNOSIS — M79675 Pain in left toe(s): Secondary | ICD-10-CM | POA: Diagnosis not present

## 2017-10-31 DIAGNOSIS — G6181 Chronic inflammatory demyelinating polyneuritis: Secondary | ICD-10-CM | POA: Diagnosis not present

## 2017-10-31 DIAGNOSIS — I5033 Acute on chronic diastolic (congestive) heart failure: Secondary | ICD-10-CM | POA: Diagnosis not present

## 2017-10-31 DIAGNOSIS — I110 Hypertensive heart disease with heart failure: Secondary | ICD-10-CM | POA: Diagnosis not present

## 2017-10-31 DIAGNOSIS — I2510 Atherosclerotic heart disease of native coronary artery without angina pectoris: Secondary | ICD-10-CM | POA: Diagnosis not present

## 2017-10-31 DIAGNOSIS — R338 Other retention of urine: Secondary | ICD-10-CM | POA: Diagnosis not present

## 2017-10-31 DIAGNOSIS — N401 Enlarged prostate with lower urinary tract symptoms: Secondary | ICD-10-CM | POA: Diagnosis not present

## 2017-11-03 DIAGNOSIS — M79642 Pain in left hand: Secondary | ICD-10-CM | POA: Diagnosis not present

## 2017-11-03 DIAGNOSIS — S62242D Displaced fracture of shaft of first metacarpal bone, left hand, subsequent encounter for fracture with routine healing: Secondary | ICD-10-CM | POA: Diagnosis not present

## 2017-11-08 DIAGNOSIS — G6181 Chronic inflammatory demyelinating polyneuritis: Secondary | ICD-10-CM | POA: Diagnosis not present

## 2017-11-08 DIAGNOSIS — I110 Hypertensive heart disease with heart failure: Secondary | ICD-10-CM | POA: Diagnosis not present

## 2017-11-08 DIAGNOSIS — R338 Other retention of urine: Secondary | ICD-10-CM | POA: Diagnosis not present

## 2017-11-08 DIAGNOSIS — I5033 Acute on chronic diastolic (congestive) heart failure: Secondary | ICD-10-CM | POA: Diagnosis not present

## 2017-11-08 DIAGNOSIS — I2510 Atherosclerotic heart disease of native coronary artery without angina pectoris: Secondary | ICD-10-CM | POA: Diagnosis not present

## 2017-11-08 DIAGNOSIS — N401 Enlarged prostate with lower urinary tract symptoms: Secondary | ICD-10-CM | POA: Diagnosis not present

## 2017-11-11 DIAGNOSIS — G6181 Chronic inflammatory demyelinating polyneuritis: Secondary | ICD-10-CM | POA: Diagnosis not present

## 2017-11-11 DIAGNOSIS — I110 Hypertensive heart disease with heart failure: Secondary | ICD-10-CM | POA: Diagnosis not present

## 2017-11-11 DIAGNOSIS — I2510 Atherosclerotic heart disease of native coronary artery without angina pectoris: Secondary | ICD-10-CM | POA: Diagnosis not present

## 2017-11-11 DIAGNOSIS — R338 Other retention of urine: Secondary | ICD-10-CM | POA: Diagnosis not present

## 2017-11-11 DIAGNOSIS — N401 Enlarged prostate with lower urinary tract symptoms: Secondary | ICD-10-CM | POA: Diagnosis not present

## 2017-11-11 DIAGNOSIS — I5033 Acute on chronic diastolic (congestive) heart failure: Secondary | ICD-10-CM | POA: Diagnosis not present

## 2017-11-12 DIAGNOSIS — K5900 Constipation, unspecified: Secondary | ICD-10-CM | POA: Diagnosis not present

## 2017-11-12 DIAGNOSIS — S5292XA Unspecified fracture of left forearm, initial encounter for closed fracture: Secondary | ICD-10-CM | POA: Diagnosis not present

## 2017-11-12 DIAGNOSIS — I482 Chronic atrial fibrillation: Secondary | ICD-10-CM | POA: Diagnosis not present

## 2017-11-12 DIAGNOSIS — R339 Retention of urine, unspecified: Secondary | ICD-10-CM | POA: Diagnosis not present

## 2017-11-12 DIAGNOSIS — D518 Other vitamin B12 deficiency anemias: Secondary | ICD-10-CM | POA: Diagnosis not present

## 2017-11-12 DIAGNOSIS — F0390 Unspecified dementia without behavioral disturbance: Secondary | ICD-10-CM | POA: Diagnosis not present

## 2017-11-12 DIAGNOSIS — E559 Vitamin D deficiency, unspecified: Secondary | ICD-10-CM | POA: Diagnosis not present

## 2017-11-12 DIAGNOSIS — N390 Urinary tract infection, site not specified: Secondary | ICD-10-CM | POA: Diagnosis not present

## 2017-11-12 DIAGNOSIS — N401 Enlarged prostate with lower urinary tract symptoms: Secondary | ICD-10-CM | POA: Diagnosis not present

## 2017-11-12 DIAGNOSIS — Z950 Presence of cardiac pacemaker: Secondary | ICD-10-CM | POA: Diagnosis not present

## 2017-11-12 DIAGNOSIS — I5030 Unspecified diastolic (congestive) heart failure: Secondary | ICD-10-CM | POA: Diagnosis not present

## 2017-11-12 DIAGNOSIS — I10 Essential (primary) hypertension: Secondary | ICD-10-CM | POA: Diagnosis not present

## 2017-12-17 DIAGNOSIS — R339 Retention of urine, unspecified: Secondary | ICD-10-CM | POA: Diagnosis not present

## 2017-12-17 DIAGNOSIS — I5030 Unspecified diastolic (congestive) heart failure: Secondary | ICD-10-CM | POA: Diagnosis not present

## 2017-12-17 DIAGNOSIS — N401 Enlarged prostate with lower urinary tract symptoms: Secondary | ICD-10-CM | POA: Diagnosis not present

## 2017-12-17 DIAGNOSIS — K5900 Constipation, unspecified: Secondary | ICD-10-CM | POA: Diagnosis not present

## 2017-12-17 DIAGNOSIS — I10 Essential (primary) hypertension: Secondary | ICD-10-CM | POA: Diagnosis not present

## 2017-12-17 DIAGNOSIS — I482 Chronic atrial fibrillation: Secondary | ICD-10-CM | POA: Diagnosis not present

## 2017-12-17 DIAGNOSIS — F0390 Unspecified dementia without behavioral disturbance: Secondary | ICD-10-CM | POA: Diagnosis not present

## 2017-12-17 DIAGNOSIS — N390 Urinary tract infection, site not specified: Secondary | ICD-10-CM | POA: Diagnosis not present

## 2017-12-17 DIAGNOSIS — D518 Other vitamin B12 deficiency anemias: Secondary | ICD-10-CM | POA: Diagnosis not present

## 2017-12-17 DIAGNOSIS — Z950 Presence of cardiac pacemaker: Secondary | ICD-10-CM | POA: Diagnosis not present

## 2017-12-17 DIAGNOSIS — S5292XA Unspecified fracture of left forearm, initial encounter for closed fracture: Secondary | ICD-10-CM | POA: Diagnosis not present

## 2017-12-17 DIAGNOSIS — E559 Vitamin D deficiency, unspecified: Secondary | ICD-10-CM | POA: Diagnosis not present

## 2018-01-09 DIAGNOSIS — E559 Vitamin D deficiency, unspecified: Secondary | ICD-10-CM | POA: Diagnosis not present

## 2018-01-09 DIAGNOSIS — F0390 Unspecified dementia without behavioral disturbance: Secondary | ICD-10-CM | POA: Diagnosis not present

## 2018-01-09 DIAGNOSIS — I5030 Unspecified diastolic (congestive) heart failure: Secondary | ICD-10-CM | POA: Diagnosis not present

## 2018-01-09 DIAGNOSIS — K5900 Constipation, unspecified: Secondary | ICD-10-CM | POA: Diagnosis not present

## 2018-01-09 DIAGNOSIS — F5101 Primary insomnia: Secondary | ICD-10-CM | POA: Diagnosis not present

## 2018-01-09 DIAGNOSIS — Z950 Presence of cardiac pacemaker: Secondary | ICD-10-CM | POA: Diagnosis not present

## 2018-01-09 DIAGNOSIS — D518 Other vitamin B12 deficiency anemias: Secondary | ICD-10-CM | POA: Diagnosis not present

## 2018-01-09 DIAGNOSIS — I482 Chronic atrial fibrillation: Secondary | ICD-10-CM | POA: Diagnosis not present

## 2018-01-09 DIAGNOSIS — N401 Enlarged prostate with lower urinary tract symptoms: Secondary | ICD-10-CM | POA: Diagnosis not present

## 2018-01-14 DIAGNOSIS — F0390 Unspecified dementia without behavioral disturbance: Secondary | ICD-10-CM | POA: Diagnosis not present

## 2018-01-14 DIAGNOSIS — R339 Retention of urine, unspecified: Secondary | ICD-10-CM | POA: Diagnosis not present

## 2018-01-14 DIAGNOSIS — I5030 Unspecified diastolic (congestive) heart failure: Secondary | ICD-10-CM | POA: Diagnosis not present

## 2018-01-14 DIAGNOSIS — N390 Urinary tract infection, site not specified: Secondary | ICD-10-CM | POA: Diagnosis not present

## 2018-01-14 DIAGNOSIS — S5292XA Unspecified fracture of left forearm, initial encounter for closed fracture: Secondary | ICD-10-CM | POA: Diagnosis not present

## 2018-01-14 DIAGNOSIS — I482 Chronic atrial fibrillation: Secondary | ICD-10-CM | POA: Diagnosis not present

## 2018-01-14 DIAGNOSIS — I10 Essential (primary) hypertension: Secondary | ICD-10-CM | POA: Diagnosis not present

## 2018-01-14 DIAGNOSIS — K5900 Constipation, unspecified: Secondary | ICD-10-CM | POA: Diagnosis not present

## 2018-01-14 DIAGNOSIS — Z950 Presence of cardiac pacemaker: Secondary | ICD-10-CM | POA: Diagnosis not present

## 2018-01-14 DIAGNOSIS — E559 Vitamin D deficiency, unspecified: Secondary | ICD-10-CM | POA: Diagnosis not present

## 2018-01-14 DIAGNOSIS — N401 Enlarged prostate with lower urinary tract symptoms: Secondary | ICD-10-CM | POA: Diagnosis not present

## 2018-01-14 DIAGNOSIS — D518 Other vitamin B12 deficiency anemias: Secondary | ICD-10-CM | POA: Diagnosis not present

## 2018-02-16 ENCOUNTER — Encounter: Payer: Self-pay | Admitting: Neurology

## 2018-02-16 ENCOUNTER — Encounter

## 2018-02-16 ENCOUNTER — Ambulatory Visit (INDEPENDENT_AMBULATORY_CARE_PROVIDER_SITE_OTHER): Payer: Medicare Other | Admitting: Neurology

## 2018-02-16 VITALS — BP 144/90 | HR 70 | Ht 70.5 in | Wt 195.0 lb

## 2018-02-16 DIAGNOSIS — I2510 Atherosclerotic heart disease of native coronary artery without angina pectoris: Secondary | ICD-10-CM | POA: Diagnosis not present

## 2018-02-16 DIAGNOSIS — G6181 Chronic inflammatory demyelinating polyneuritis: Secondary | ICD-10-CM | POA: Diagnosis not present

## 2018-02-16 NOTE — Progress Notes (Signed)
[facility]  Follow-up Visit   Date: 02/16/18   Jonathon [name] MRN: [id number] DOB: [birth year]   Interim History: Jonathon [name] is a 82 y.o. year-old right-handed African American male with history of BPH, hypertension, bradycardia s/p pacemaker, and previous tobacco use presenting for follow-up of CIDP.  He is here with his daughter.    History of present illness: Patient was in his usual state of health until 2013. He has always been physical active and would go to the gym daily (walking up 10 miles/day). During the spring of 2013, he developed relatively subacute onset of left > right hamstring soreness, proximal leg weakness, and atrophy without an inciting event. Functionally, he is still able to complete all his usual activities, but has became much more cautious when climbing stairs.  In September 2014, he underwent NCS/EMG which did not show any myopathy or radiculopathy of the left lower extremity.  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  CK, aldolase, B12 was within normal limits. SPEP/UPEP shows monoclonal IgA kappa gammopathy and he underwent extensive hematologic testing, including bone marrow biopsy by Dr. Marin Olp which was nondiagnostic.    In 2015, he continued to have progressive loss of muscle bulk in the hands and proximal legs. He has difficulty with climbing stairs and opening jars, and dyspnea on exertion. 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   He reports previously drinking 4 beers/day and on the weekends up to 6 beers/day. 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  GM1 and anti-MAG antibody is negative. There is evidence of severe multilevel spinal stenosis of the cervical spine, and milder findings in the lumbar region.  CSF studies shows increase oligoclonal bands with normal cell count, IgG index, and myelin basic  proteins.  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   He completed 62-month of monthly IVMP and reports little benefit over the past few months, but no worsening.    He stopped working in spring of 2016 and going to the gym.  [facility] sync[facility] spell secondary to orthostatic hypotension.  He has since been started on mestinon and is using compression stockings.  In late 2016, he was doing better ambulating independently, back at the gym, and gained weight, too.  Unfortunately, in 2017, his legs became much weaker and he suffered about 3 falls and had two fainting spells. Because of his progressive weakness, he was started on IVIG in October 2017, but only completed one dose. In 2018, he had one hospitalization for syncope and fall where he was on found by his daughter in the bathroom.   UPDATE 01/08/2017:  He was restarted on IVIG in early August 2018 which caused elevated blood pressure transiently.  He was doing well until 8/18 when he was found lying on the kitchen floor by family confused.  It was unclear whether he had a syncopal event or seizure.  He CKs were elevated to 6400.  EEG showed delta slowing, no epileptiform discharges.  He has noticed increased confusion, stuttering and speech change over the past few months.  He lives by himself and since his discharge has a home health.  His daughter his concerned because he is alone at night and feels he needs 24-hr  supervision, but they are unable to provide it. He was doing his own medications until his recent hospitalization and now has a home nurse.   Mood is fair and he is depressed with the fact that he is more dependent for care.   UPDATE 05/27/2017:  He is here for follow-up visit to discuss results of neuropsychological testing and routine follow-up.  There was no sign of  dementia on his cognitive testing.  There is mild cognitive impairment and adjustment disorder.  He states that since his son has moved in with him, his mood is doing much better.  He quit drinking alcohol and has started to eat 2 meals daily.  In fact, he has gained 11lb since September.   At his last visit, he was started on vitamin B12 and B1 supplementation.  Since making these changes, he is no longer confused and family has seen a marked improvement in his overall cognition and health.  He has not suffered any falls and walks with a cane/walker as needed.  No new complaints.   UPDATE 02/16/2018: He is here for routine follow-up visit and is doing well.  He is now living in [facility] and feels much safer than when he was living at home.  He has been eating well, participating in physical therapy, and is even able to walk unassisted at times.  He has not suffered any falls.  He does use a cane most of the time.  He denies any new weakness or worsening neuropathy.  He was hospitalized in April with urinary tract infection and delirium, which resulted in his transition him to a skilled nursing facility.  Daughter and patient are much happier with him living in a supervised facility.  Medications:  Current Outpatient Medications on File Prior to Visit  Medication Sig Dispense Refill  . Acetaminophen (TYLENOL) 325 MG CAPS Take 650 mg by mouth as needed.    Marland Kitchen aspirin EC 81 MG tablet Take 81 mg by mouth daily.    . flecainide (TAMBOCOR) 50 MG tablet Take 75 mg by mouth daily as needed.    . furosemide (LASIX) 40 MG tablet Take 40 mg by mouth daily.    . Garlic 8938 MG CAPS Take by mouth daily.    . irbesartan (AVAPRO) 300 MG tablet Take 300 mg by mouth daily.    . Melatonin 3 MG CAPS Take 3 mg by mouth at bedtime.    . memantine (NAMENDA) 5 MG tablet Take 5 mg by mouth 2 (two) times daily.    . metoprolol tartrate (LOPRESSOR) 25 MG tablet Take 1 tablet (25 mg total) by mouth 2  (two) times daily.    . polyethylene glycol (MIRALAX / GLYCOLAX) packet Take 17 g by mouth daily. 14 each 0  . Saccharomyces boulardii (FLORASTOR PO) Take 250 mg by mouth 2 (two) times daily.    . tamsulosin (FLOMAX) 0.4 MG CAPS capsule Take 2 capsules (0.8 mg total) by mouth daily. 90 capsule 0  . vitamin B-12 (CYANOCOBALAMIN) 500 MCG tablet Take 1,000 mcg by mouth daily.    . Vitamin D, Ergocalciferol, (DRISDOL) 50000 units CAPS capsule Take 50,000 Units by mouth every 7 (seven) days.     No current facility-administered medications on file prior to visit.     Allergies:  Allergies  Allergen Reactions  . Lexapro [Escitalopram Oxalate]     3/30-08/16/17 ? Serotonin Syndrome     Review of Systems:  CONSTITUTIONAL: No fevers, chills, night sweats +weight  gain EYES: No visual changes or eye pain ENT: No hearing changes.  No history of nose bleeds.   RESPIRATORY: No cough, wheezing and shortness of breath.   CARDIOVASCULAR: Negative for chest pain, and palpitations.   GI: Negative for abdominal discomfort, blood in stools or black stools.  No recent change in bowel habits.   GU:  No history of incontinence.   MUSCLOSKELETAL: No history of joint pain or swelling.  No myalgias.   SKIN: Negative for lesions, rash, and itching.   HEMATOLOGY/ONCOLOGY: Negative for prolonged bleeding, bruising easily, and swollen nodes.  ENDOCRINE: Negative for cold or heat intolerance, polydipsia or goiter.   PSYCH:  No depression or anxiety symptoms.   NEURO: As Above.   Vital Signs:  BP (!) 144/90   Pulse 70   Ht 5' 10.5" (1.791 m)   Wt 195 lb (88.5 kg)   SpO2 96%   BMI 27.58 kg/m   General Medical Exam:   General:  Well appearing, comfortable  Eyes/ENT: see cranial nerve examination.   Neck: No masses appreciated.  Full range of motion without tenderness.  No carotid bruits. Respiratory:  Clear to auscultation, good air entry bilaterally.   Cardiac:  Regular rate and rhythm, no murmur.     Ext: Generalized loss of muscle bulk in the legs, no edema.  Neurological Exam: MENTAL STATUS including orientation to time, place, person, recent and remote memory, attention span and concentration, language, and fund of knowledge is intact.  Speech is not dysarthric.  CRANIAL NERVES:  Pupils are round and reactive to light.  Normal conjugate, extra-ocular eye movements in all directions of gaze. Mild bilateral ptosis.  Face is symmetric.  Palate elevates symmetrically.    MOTOR:  Bilateral severe ADM, ABP and marked bilateral quadriceps and FDI atrophy.  No tremor or pronator drift.   Right Upper Extremity:    Left Upper Extremity:    Deltoid  5/5   Deltoid  5/5   Biceps  5/5   Biceps  5/5   Triceps  5/5   Triceps  5/5   Wrist extensors  5/5   Wrist extensors  5/5   Wrist flexors  5/5   Wrist flexors  5/5   Finger extensors  5/5   Finger extensors  5/5   Finger flexors  5/5   Finger flexors  5/5   Dorsal interossei  4/5   Dorsal interossei  4/5   Abductor pollicis  4+/5   Abductor pollicis  4+/5   Tone (Ashworth scale)  0  Tone (Ashworth scale)  0   Right Lower Extremity:    Left Lower Extremity:    Hip flexors  5/5   Hip flexors  5/5   Hip extensors  5/5   Hip extensors  5/5   Knee flexors  5/5   Knee flexors  5/5   Knee extensors  5/5   Knee extensors  5/5   Dorsiflexors  5/5   Dorsiflexors  5/5   Plantarflexors  5/5   Plantarflexors  5/5   Toe extensors  5/5   Toe extensors  5/5   Toe flexors  5/5   Toe flexors  5/5   Tone (Ashworth scale)  0  Tone (Ashworth scale)  0   MSRs:  Reflexes are 2+/4 in upper extremities, absent reflexes in the legs.    COORDINATION/GAIT:  He is easily able to rise from chair without using arms.  Gait is mildly wide-based, appears stable and assisted with cane  as needed.  Data: Labs 01/13/2013:  CK 55, aldolase 7.9, TSH 0.39, copper 97, ceruloplasmin 30 Labs 07/2013:  vitamin  B12 519, ANA neg, RF neg, ESR 5 Labs 08/10/2013:  CK 91, aldolase  14.9*, GM1 antibody - neg Labs 11/09/2013:  VEGF 118* (normal 31-86) Labs 06/02/2014:  VEGF <31 Labs 08/23/2014:  Vitamin B12 643, folate 18.6, CK 72 Labs 01/08/2017:  TSH 1.28, vitamin B12 238, CK 147  CSF 09/24/2013:  R1 W0 G59 Protein 42, IgG index 4.4, MBP < 2.0, WNV - neg, cytology - neg, OCB - 3 well defined bands present in CSF and serum, more prominent in CSF  EMG 01/25/2013:   1. 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  2. Left ulnar neuropathy at the elbow, moderately severe in degree electrically. A superimposed C8 motor radiculopathy cannot be excluded. 3. No evidence of diffuse motor axon loss or a generalized myopathy.  EMG 09/16/2013:  There is electrophysiological evidence of a chronic generalized sensorimotor polyradiculoneuropathy, axonal loss and demyelinating in type, affecting the left side. When compared to his previous EMG dated 01/25/2013, there is an interval worsening of findings.  Labs showed monoclonal IgA kappa gammopathy on serum and saw Dr. Marin Olp, who also performed bone marrow biopsy which was negative.  He also has transaminatis likely due to fatty liver.  The possibility of POEMS was raised given elevated VEGF, but repeat VEGF from 05/2014 returned normal.  NCS/EMG of the left arm and leg 03/21/2016: 1. There is electrophysiologic evidence of a chronic, generalized sensorimotor polyradiculoneuropathy affecting the left side. When compared to his EMG dated 09/17/2013, there has been mild progression of disease. 2. There is also evidence of a superimposed left ulnar neuropathy across the elbow, demyelinating with secondary axon loss, and left median neuropathy at wrist (carpal tunnel syndrome). 3. Specifically, there is no evidence of a diffuse myopathy.  Labs 03/08/2016:  CK, aldolase 8.2, B12, B1 29, ACE 57, heavy metal screen neg, c-ANCA 1:40, p-ANCA neg  CT  head and cervical spine 12/28/2016: Head CT:  No acute or traumatic finding.  Normal study for age.  Cervical spine CT: No acute or traumatic finding. Extensive chronic degenerative changes as outlined above. Routine EEG 02/05/2017:  Normal   IMPRESSION: 1. Chronic inflammatory demyelinating polyradiculoneuropathy.  Clinically, he is doing well and exam remains stable as compared to earlier this year.  He has not had any interval falls and is able to walk unassisted at times.  He appears much happier and mood is better since living in an assisted living facility.  Continue to follow off IVIG.  I praised him for self motivation for staying active and engaging in exercises He has underwent an exhaustive work-up in the past for etiology of his neuropathy, which has been nondiagnostic and pointing more towards chronic alcohol consumption.  Since being sober for the past year, there has certainly been stability in his neurologic symptoms.  We discussed doing genetic testing as well as assessing for hereditary transthyretin amyloidosis for his neuropathy, which he is agreeable to.  2.  Vitamin B12 and B1 deficiency.  Continue vitamin B12 1000 mcg and vitamin B1 100 mg daily  3.  Mild cognitive changes, resolved -most likely due to vitamin deficiency.  Formal neuropsychological testing did not show signs of dementia.  Return to clinic in 6 months  Greater than 50% of this 30 minute visit was spent in counseling, explanation  of diagnosis, planning of further management, and coordination of care.  Thank you for allowing me to participate in patient's care.  If I can answer any additional questions, I would be pleased to do so.    Sincerely,    Haruye Lainez K. Posey Pronto, DO

## 2018-02-16 NOTE — Patient Instructions (Signed)
It was great to see you today!  We will call you when we have received the Saliva kit to do genetic testing  Return to clinic in 6 months

## 2018-02-18 DIAGNOSIS — F0390 Unspecified dementia without behavioral disturbance: Secondary | ICD-10-CM | POA: Diagnosis not present

## 2018-02-18 DIAGNOSIS — R339 Retention of urine, unspecified: Secondary | ICD-10-CM | POA: Diagnosis not present

## 2018-02-18 DIAGNOSIS — S5292XA Unspecified fracture of left forearm, initial encounter for closed fracture: Secondary | ICD-10-CM | POA: Diagnosis not present

## 2018-02-18 DIAGNOSIS — K5900 Constipation, unspecified: Secondary | ICD-10-CM | POA: Diagnosis not present

## 2018-02-18 DIAGNOSIS — Z950 Presence of cardiac pacemaker: Secondary | ICD-10-CM | POA: Diagnosis not present

## 2018-02-18 DIAGNOSIS — I5030 Unspecified diastolic (congestive) heart failure: Secondary | ICD-10-CM | POA: Diagnosis not present

## 2018-02-18 DIAGNOSIS — E559 Vitamin D deficiency, unspecified: Secondary | ICD-10-CM | POA: Diagnosis not present

## 2018-02-18 DIAGNOSIS — I10 Essential (primary) hypertension: Secondary | ICD-10-CM | POA: Diagnosis not present

## 2018-02-18 DIAGNOSIS — N401 Enlarged prostate with lower urinary tract symptoms: Secondary | ICD-10-CM | POA: Diagnosis not present

## 2018-02-18 DIAGNOSIS — N390 Urinary tract infection, site not specified: Secondary | ICD-10-CM | POA: Diagnosis not present

## 2018-02-18 DIAGNOSIS — D518 Other vitamin B12 deficiency anemias: Secondary | ICD-10-CM | POA: Diagnosis not present

## 2018-03-02 DIAGNOSIS — Z23 Encounter for immunization: Secondary | ICD-10-CM | POA: Diagnosis not present

## 2018-03-05 ENCOUNTER — Ambulatory Visit: Payer: Self-pay | Admitting: *Deleted

## 2018-03-05 ENCOUNTER — Ambulatory Visit: Payer: Self-pay

## 2018-03-05 NOTE — Telephone Encounter (Signed)
Pt's daughter, Jonathon [name], called stating that the pt's blood pressure is elevated 186/104 at 0926 on10/24/19; on 03/04/18 it was 178/98 at 0803; recommendations made per nurse triage; the pt's daughter Jonathon [name] would like to schedule an appointment on Tuesday 03/10/18 at 1500 because she has to make arrangements for him to bring him; she also says that she is not concerned because the pt is at a monitored facility (pt is a resident at [name]); they would also like to transfer care from [name]; pt/daughter offered and accepted appointment with Jonathon [name], Jonathon [name], 03/10/18 at 1500; they verbalize understanding; will route to office for notification of this upcoming appointment.   Reason for Disposition . Systolic BP  >= 500 OR Diastolic >= 938  Answer Assessment - Initial Assessment Questions 1. BLOOD PRESSURE: "What is the blood pressure?" "Did you take at least two measurements 5 minutes apart?"     Yes 186/104 and 178/98 2. ONSET: "When did you take your blood pressure?"     03/04/18 at 0803 and 186/104 on 03/05/18; not usre if used automatic or manual cuff; pt is a resident of [name] 3. HOW: "How did you obtain the blood pressure?" (e.g., visiting nurse, automatic home BP monitor)     Automatic and cuff 4. HISTORY: "Do you have a history of high blood pressure?"     yes 5. MEDICATIONS: "Are you taking any medications for blood pressure?" "Have you missed any doses recently?"     no 6. OTHER SYMPTOMS: "Do you have any symptoms?" (e.g., headache, chest pain, blurred vision, difficulty breathing, weakness)     no 7. PREGNANCY: "Is there any chance you are pregnant?" "When was your last menstrual period?"     n/a  Protocols used: HIGH BLOOD PRESSURE-A-AH

## 2018-03-10 ENCOUNTER — Ambulatory Visit: Payer: Self-pay | Admitting: Family Medicine

## 2018-03-12 ENCOUNTER — Ambulatory Visit: Payer: Medicare Other

## 2018-03-12 DIAGNOSIS — Z950 Presence of cardiac pacemaker: Secondary | ICD-10-CM | POA: Diagnosis not present

## 2018-03-20 ENCOUNTER — Encounter: Payer: Self-pay | Admitting: Family Medicine

## 2018-03-20 ENCOUNTER — Ambulatory Visit (INDEPENDENT_AMBULATORY_CARE_PROVIDER_SITE_OTHER): Payer: Medicare Other | Admitting: Family Medicine

## 2018-03-20 VITALS — BP 125/79 | HR 72 | Temp 97.7°F | Wt 189.0 lb

## 2018-03-20 DIAGNOSIS — I2510 Atherosclerotic heart disease of native coronary artery without angina pectoris: Secondary | ICD-10-CM

## 2018-03-20 DIAGNOSIS — I10 Essential (primary) hypertension: Secondary | ICD-10-CM | POA: Diagnosis not present

## 2018-03-20 NOTE — Patient Instructions (Addendum)
Please check patient's blood pressure using a standard arm cuff not a wrist or finger blood pressure cuff as these can give falsely elevated readings.  Please provide patient with a log of his blood pressure readings so that he can bring them with him to his next doctor's appointment.  These readings will help determine if his blood pressure medicines need to be adjusted. Managing Your Hypertension Hypertension is commonly called high blood pressure. This is when the force of your blood pressing against the walls of your arteries is too strong. Arteries are blood vessels that carry blood from your heart throughout your body. Hypertension forces the heart to work harder to pump blood, and may cause the arteries to become narrow or stiff. Having untreated or uncontrolled hypertension can cause heart attack, stroke, kidney disease, and other problems. What are blood pressure readings? A blood pressure reading consists of a higher number over a lower number. Ideally, your blood pressure should be below 120/80. The first ("top") number is called the systolic pressure. It is a measure of the pressure in your arteries as your heart beats. The second ("bottom") number is called the diastolic pressure. It is a measure of the pressure in your arteries as the heart relaxes. What does my blood pressure reading mean? Blood pressure is classified into four stages. Based on your blood pressure reading, your health care provider may use the following stages to determine what type of treatment you need, if any. Systolic pressure and diastolic pressure are measured in a unit called mm Hg. Normal  Systolic pressure: below 814.  Diastolic pressure: below 80. Elevated  Systolic pressure: 481-856.  Diastolic pressure: below 80. Hypertension stage 1  Systolic pressure: 314-970.  Diastolic pressure: 26-37. Hypertension stage 2  Systolic pressure: 858 or above.  Diastolic pressure: 90 or above. What health risks  are associated with hypertension? Managing your hypertension is an important responsibility. Uncontrolled hypertension can lead to:  A heart attack.  A stroke.  A weakened blood vessel (aneurysm).  Heart failure.  Kidney damage.  Eye damage.  Metabolic syndrome.  Memory and concentration problems.  What changes can I make to manage my hypertension? Hypertension can be managed by making lifestyle changes and possibly by taking medicines. Your health care provider will help you make a plan to bring your blood pressure within a normal range. Eating and drinking  Eat a diet that is high in fiber and potassium, and low in salt (sodium), added sugar, and fat. An example eating plan is called the DASH (Dietary Approaches to Stop Hypertension) diet. To eat this way: ? Eat plenty of fresh fruits and vegetables. Try to fill half of your plate at each meal with fruits and vegetables. ? Eat whole grains, such as whole wheat pasta, brown rice, or whole grain bread. Fill about one quarter of your plate with whole grains. ? Eat low-fat diary products. ? Avoid fatty cuts of meat, processed or cured meats, and poultry with skin. Fill about one quarter of your plate with lean proteins such as fish, chicken without skin, beans, eggs, and tofu. ? Avoid premade and processed foods. These tend to be higher in sodium, added sugar, and fat.  Reduce your daily sodium intake. Most people with hypertension should eat less than 1,500 mg of sodium a day.  Limit alcohol intake to no more than 1 drink a day for nonpregnant women and 2 drinks a day for men. One drink equals 12 oz of beer, 5 oz of  wine, or 1 oz of hard liquor. Lifestyle  Work with your health care provider to maintain a healthy body weight, or to lose weight. Ask what an ideal weight is for you.  Get at least 30 minutes of exercise that causes your heart to beat faster (aerobic exercise) most days of the week. Activities may include walking,  swimming, or biking.  Include exercise to strengthen your muscles (resistance exercise), such as weight lifting, as part of your weekly exercise routine. Try to do these types of exercises for 30 minutes at least 3 days a week.  Do not use any products that contain nicotine or tobacco, such as cigarettes and e-cigarettes. If you need help quitting, ask your health care provider.  Control any long-term (chronic) conditions you have, such as high cholesterol or diabetes. Monitoring  Monitor your blood pressure at home as told by your health care provider. Your personal target blood pressure may vary depending on your medical conditions, your age, and other factors.  Have your blood pressure checked regularly, as often as told by your health care provider. Working with your health care provider  Review all the medicines you take with your health care provider because there may be side effects or interactions.  Talk with your health care provider about your diet, exercise habits, and other lifestyle factors that may be contributing to hypertension.  Visit your health care provider regularly. Your health care provider can help you create and adjust your plan for managing hypertension. Will I need medicine to control my blood pressure? Your health care provider may prescribe medicine if lifestyle changes are not enough to get your blood pressure under control, and if:  Your systolic blood pressure is 130 or higher.  Your diastolic blood pressure is 80 or higher.  Take medicines only as told by your health care provider. Follow the directions carefully. Blood pressure medicines must be taken as prescribed. The medicine does not work as well when you skip doses. Skipping doses also puts you at risk for problems. Contact a health care provider if:  You think you are having a reaction to medicines you have taken.  You have repeated (recurrent) headaches.  You feel dizzy.  You have swelling  in your ankles.  You have trouble with your vision. Get help right away if:  You develop a severe headache or confusion.  You have unusual weakness or numbness, or you feel faint.  You have severe pain in your chest or abdomen.  You vomit repeatedly.  You have trouble breathing. Summary  Hypertension is when the force of blood pumping through your arteries is too strong. If this condition is not controlled, it may put you at risk for serious complications.  Your personal target blood pressure may vary depending on your medical conditions, your age, and other factors. For most people, a normal blood pressure is less than 120/80.  Hypertension is managed by lifestyle changes, medicines, or both. Lifestyle changes include weight loss, eating a healthy, low-sodium diet, exercising more, and limiting alcohol. This information is not intended to replace advice given to you by your health care provider. Make sure you discuss any questions you have with your health care provider. Document Released: 01/22/2012 Document Revised: 03/27/2016 Document Reviewed: 03/27/2016 Elsevier Interactive Patient Education  2018 [employer].  How to Take Your Blood Pressure Blood pressure is a measurement of how strongly your blood is pressing against the walls of your arteries. Arteries are blood vessels that carry blood from  your heart throughout your body. Your health care provider takes your blood pressure at each office visit. You can also take your own blood pressure at home with a blood pressure machine. You may need to take your own blood pressure:  To confirm a diagnosis of high blood pressure (hypertension).  To monitor your blood pressure over time.  To make sure your blood pressure medicine is working.  Supplies needed: To take your blood pressure, you will need a blood pressure machine. You can buy a blood pressure machine, or blood pressure monitor, at most drugstores or online. There are  several types of home blood pressure monitors. When choosing one, consider the following:  Choose a monitor that has an arm cuff.  Choose a monitor that wraps snugly around your upper arm. You should be able to fit only one finger between your arm and the cuff.  Do not choose a monitor that measures your blood pressure from your wrist or finger.  Your health care provider can suggest a reliable monitor that will meet your needs. How to prepare To get the most accurate reading, avoid the following for 30 minutes before you check your blood pressure:  Drinking caffeine.  Drinking alcohol.  Eating.  Smoking.  Exercising.  Five minutes before you check your blood pressure:  Empty your bladder.  Sit quietly without talking in a dining chair, rather than in a soft couch or armchair.  How to take your blood pressure To check your blood pressure, follow the instructions in the manual that came with your blood pressure monitor. If you have a digital blood pressure monitor, the instructions may be as follows: 1. Sit up straight. 2. Place your feet on the floor. Do not cross your ankles or legs. 3. Rest your left arm at the level of your heart on a table or desk or on the arm of a chair. 4. Pull up your shirt sleeve. 5. Wrap the blood pressure cuff around the upper part of your left arm, 1 inch (2.5 cm) above your elbow. It is best to wrap the cuff around bare skin. 6. Fit the cuff snugly around your arm. You should be able to place only one finger between the cuff and your arm. 7. Position the cord inside the groove of your elbow. 8. Press the power button. 9. Sit quietly while the cuff inflates and deflates. 10. Read the digital reading on the monitor screen and write it down (record it). 11. Wait 2-3 minutes, then repeat the steps, starting at step 1.  What does my blood pressure reading mean? A blood pressure reading consists of a higher number over a lower number. Ideally, your  blood pressure should be below 120/80. The first ("top") number is called the systolic pressure. It is a measure of the pressure in your arteries as your heart beats. The second ("bottom") number is called the diastolic pressure. It is a measure of the pressure in your arteries as the heart relaxes. Blood pressure is classified into four stages. The following are the stages for adults who do not have a short-term serious illness or a chronic condition. Systolic pressure and diastolic pressure are measured in a unit called mm Hg. Normal  Systolic pressure: below 037.  Diastolic pressure: below 80. Elevated  Systolic pressure: 048-889.  Diastolic pressure: below 80. Hypertension stage 1  Systolic pressure: 169-450.  Diastolic pressure: 38-88. Hypertension stage 2  Systolic pressure: 280 or above.  Diastolic pressure: 90 or above. You can  have prehypertension or hypertension even if only the systolic or only the diastolic number in your reading is higher than normal. Follow these instructions at home:  Check your blood pressure as often as recommended by your health care provider.  Take your monitor to the next appointment with your health care provider to make sure: ? That you are using it correctly. ? That it provides accurate readings.  Be sure you understand what your goal blood pressure numbers are.  Tell your health care provider if you are having any side effects from blood pressure medicine. Contact a health care provider if:  Your blood pressure is consistently high. Get help right away if:  Your systolic blood pressure is higher than 180.  Your diastolic blood pressure is higher than 110. This information is not intended to replace advice given to you by your health care provider. Make sure you discuss any questions you have with your health care provider. Document Released: 10/06/2015 Document Revised: 12/19/2015 Document Reviewed: 10/06/2015 Elsevier Interactive  Patient Education  [employer].

## 2018-03-20 NOTE — Progress Notes (Signed)
Subjective:    Patient ID: Jonathon [name], male    DOB: [birth year], 82 y.o.   MRN: [id number]  No chief complaint on file.   HPI Patient was seen today for BP check.  Pt notes bp has been elevated at the nursing facility.  Per memory bp 159/85, 174/95, 101/100.  Pt notes bp checked twice a day sometimes with wrist cuff and other times with an arm cuff.  Pt denies HAs, changes in vision, CP, HAs.  Pt taking lasix 40 mg, irbesartan 300 mg, metoprolol tartrate 25 mg BID.  Pt notes h/o seizures.  Last sz. 6 months ago.  Past Medical History:  Diagnosis Date  . Asthma   . BENIGN PROSTATIC HYPERTROPHY, HX OF 10/25/2008  . BRADYCARDIA 2005  . Chronic diastolic congestive heart failure ([place]) 06/05/2016  . CIDP (chronic inflammatory demyelinating polyneuropathy) ([place])   . HYPERTENSION 11/21/2006  . Iron deficiency anemia, unspecified 04/19/2013  . 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 PACEMAKER, PERMANENT 2005   Gen change 2014 Medtronic Adaptic L dual-chamber pacemaker, serial [id number]   . SVT (supraventricular tachycardia) ([place])   . TOBACCO ABUSE 10/25/2008   Quit 2012    Allergies  Allergen Reactions  . Lexapro [Escitalopram Oxalate]     3/30-08/16/17 ? Serotonin Syndrome    ROS General: Denies fever, chills, night sweats, changes in weight, changes in appetite HEENT: Denies headaches, ear pain, changes in vision, rhinorrhea, sore throat CV: Denies CP, palpitations, SOB, orthopnea Pulm: Denies SOB, cough, wheezing GI: Denies abdominal pain, nausea, vomiting, diarrhea, constipation GU: Denies dysuria, hematuria, frequency, vaginal discharge Msk: Denies muscle cramps, joint pains Neuro: Denies weakness, numbness, tingling Skin: Denies rashes, bruising Psych: Denies depression, anxiety, hallucinations    Objective:    Blood pressure 130/80, pulse 72, temperature 97.7 F (36.5 C), temperature source Oral, weight 189 lb (85.7 kg), SpO2 95 %. Repeat bp 125/79  Gen.  Pleasant, well-nourished, in no distress, normal affect  HEENT: [place]/AT, face symmetric, no scleral icterus, PERRLA, nares patent without drainage Lungs: no accessory muscle use, CTAB, no wheezes or rales Cardiovascular: RRR, no m/r/g, no peripheral edema Neuro:  A&Ox3, CN II-XII intact, ambulating with cane  Wt Readings from Last 3 Encounters:  03/20/18 189 lb (85.7 kg)  02/16/18 195 lb (88.5 kg)  09/12/17 176 lb 3.2 oz (79.9 kg)    Lab Results  Component Value Date   WBC 9.9 09/09/2017   HGB 9.4 (A) 09/09/2017   HCT 29 (A) 09/09/2017   PLT 201 09/09/2017   GLUCOSE 95 09/07/2017   CHOL 202 (H) 01/14/2013   TRIG 206.0 (H) 01/14/2013   HDL 53.60 01/14/2013   LDLDIRECT 126.6 01/14/2013   ALT 25 09/05/2017   AST 38 09/05/2017   NA 138 09/09/2017   K 3.6 09/09/2017   CL 105 09/07/2017   CREATININE 0.8 09/09/2017   BUN 12 09/09/2017   CO2 24 09/07/2017   TSH 1.28 01/08/2017   PSA 1.53 12/01/2008   INR 1.04 08/10/2014   HGBA1C 6.0 11/05/2013    Assessment/Plan:  Essential hypertension  -controlled  130/80 and 125/79 in office -note written to SNF staff not to use wrist cuff to check pt's bp. -bp log to be kept so adjustments can be made to pt's bp med if needed. -continue current meds: Lasix 40 mg, Lopressor 20 mg BID, irbesartan 300 mg daily -discussed being mindful of sodium intake and continuing to drink plenty of water.  F/u in 2-4 wks for bp  Norvell Ureste, MD 

## 2018-03-24 ENCOUNTER — Ambulatory Visit: Payer: Self-pay | Admitting: Internal Medicine

## 2018-03-24 ENCOUNTER — Emergency Department (HOSPITAL_BASED_OUTPATIENT_CLINIC_OR_DEPARTMENT_OTHER): Payer: Medicare Other

## 2018-03-24 ENCOUNTER — Telehealth: Payer: Self-pay | Admitting: Internal Medicine

## 2018-03-24 ENCOUNTER — Emergency Department (HOSPITAL_BASED_OUTPATIENT_CLINIC_OR_DEPARTMENT_OTHER)
Admission: EM | Admit: 2018-03-24 | Discharge: 2018-03-24 | Disposition: A | Discharge status: Home / Self Care | Payer: Medicare Other | Attending: Emergency Medicine | Admitting: Emergency Medicine

## 2018-03-24 ENCOUNTER — Encounter (HOSPITAL_BASED_OUTPATIENT_CLINIC_OR_DEPARTMENT_OTHER): Payer: Self-pay

## 2018-03-24 ENCOUNTER — Telehealth: Payer: Self-pay | Admitting: Family Medicine

## 2018-03-24 DIAGNOSIS — Z87891 Personal history of nicotine dependence: Secondary | ICD-10-CM | POA: Insufficient documentation

## 2018-03-24 DIAGNOSIS — M89011 Algoneurodystrophy, right shoulder: Secondary | ICD-10-CM | POA: Insufficient documentation

## 2018-03-24 DIAGNOSIS — Y9389 Activity, other specified: Secondary | ICD-10-CM | POA: Insufficient documentation

## 2018-03-24 DIAGNOSIS — I110 Hypertensive heart disease with heart failure: Secondary | ICD-10-CM | POA: Diagnosis not present

## 2018-03-24 DIAGNOSIS — I5032 Chronic diastolic (congestive) heart failure: Secondary | ICD-10-CM | POA: Diagnosis not present

## 2018-03-24 DIAGNOSIS — F0390 Unspecified dementia without behavioral disturbance: Secondary | ICD-10-CM | POA: Insufficient documentation

## 2018-03-24 DIAGNOSIS — S42001A Fracture of unspecified part of right clavicle, initial encounter for closed fracture: Secondary | ICD-10-CM | POA: Diagnosis not present

## 2018-03-24 DIAGNOSIS — Y999 Unspecified external cause status: Secondary | ICD-10-CM | POA: Diagnosis not present

## 2018-03-24 DIAGNOSIS — X509XXA Other and unspecified overexertion or strenuous movements or postures, initial encounter: Secondary | ICD-10-CM | POA: Insufficient documentation

## 2018-03-24 DIAGNOSIS — Z79899 Other long term (current) drug therapy: Secondary | ICD-10-CM | POA: Diagnosis not present

## 2018-03-24 DIAGNOSIS — M25511 Pain in right shoulder: Secondary | ICD-10-CM | POA: Diagnosis present

## 2018-03-24 DIAGNOSIS — R531 Weakness: Secondary | ICD-10-CM | POA: Diagnosis not present

## 2018-03-24 DIAGNOSIS — Y92003 Bedroom of unspecified non-institutional (private) residence as the place of occurrence of the external cause: Secondary | ICD-10-CM | POA: Diagnosis not present

## 2018-03-24 DIAGNOSIS — R410 Disorientation, unspecified: Secondary | ICD-10-CM | POA: Diagnosis not present

## 2018-03-24 DIAGNOSIS — M899 Disorder of bone, unspecified: Secondary | ICD-10-CM

## 2018-03-24 DIAGNOSIS — Z7982 Long term (current) use of aspirin: Secondary | ICD-10-CM | POA: Diagnosis not present

## 2018-03-24 DIAGNOSIS — I10 Essential (primary) hypertension: Secondary | ICD-10-CM | POA: Diagnosis not present

## 2018-03-24 DIAGNOSIS — J45909 Unspecified asthma, uncomplicated: Secondary | ICD-10-CM | POA: Diagnosis not present

## 2018-03-24 DIAGNOSIS — S42031A Displaced fracture of lateral end of right clavicle, initial encounter for closed fracture: Secondary | ICD-10-CM | POA: Diagnosis not present

## 2018-03-24 DIAGNOSIS — R937 Abnormal findings on diagnostic imaging of other parts of musculoskeletal system: Secondary | ICD-10-CM | POA: Diagnosis not present

## 2018-03-24 MED ORDER — ACETAMINOPHEN 500 MG PO TABS: 1000.0000 mg | ORAL_TABLET | Freq: Four times a day (QID) | ORAL | 0 refills | Status: DC

## 2018-03-24 MED ORDER — ACETAMINOPHEN 500 MG PO TABS
1000.0000 mg | ORAL_TABLET | Freq: Once | ORAL | Status: AC
  Administered 2018-03-24: 1000 mg via ORAL
  Filled 2018-03-24: qty 2

## 2018-03-24 NOTE — Telephone Encounter (Signed)
Pt's daughter calling, reports [facility] facility called to report pt could not raise his right arm and BP 168/102.  Daughter asked facility agent if EMS should be called; stated not necessary. Daughter is not with patient.  She called the facility to have them call NT back with pt present.

## 2018-03-24 NOTE — Telephone Encounter (Signed)
LMTCB for pt's daughter (DPR) [name] to move pt's TOC appt sooner, this week if possible - can call office to assist with work-in

## 2018-03-24 NOTE — Telephone Encounter (Signed)
Reviewed with Dr Sherren Mocha.  Verbally agreed that patient should go to ED. Patient has arrived at the ED.

## 2018-03-24 NOTE — ED Notes (Signed)
NAD at this time. Pt is stable and leaving with daughter.

## 2018-03-24 NOTE — ED Notes (Signed)
Per EMS pt from [facility]; pt states felt his rt shoulder pop when he raised his arm while in bed. Pt is able to move rt arm

## 2018-03-24 NOTE — Addendum Note (Signed)
Addended by: Elliot Cousin on: 03/24/2018 08:47 AM   Modules accepted: Level of Service, SmartSet

## 2018-03-24 NOTE — Telephone Encounter (Signed)
Spoke with pt from [facility] facility. See previous telephone encounter with daughter. Pt reports was transferring himself from bed to chair yesterday AM, pulling himself to chair with right arm; heard "pop" right shoulder, immediate severe pain.  States limited ROM. Facility nurse present during call, states pt unable to raise arm. Pt reports 10/10 pain, has not taken any pain meds or applied ice. Nurse reports no swelling, redness, warmth. Pt states pain is constant, "Less when I don't move it but always there, pretty bad."  Nurse present reports BP 168/102 this am; checked during call, 165/85.  Facility initially told pt's daughter episode did not warrant EMS. Pt directed to ED; TN spoke with Rachael to confirm disposition. Facility states they will alert daughter and transport pt to ED.    Reason for Disposition . [1] SEVERE pain AND [2] not improved 2 hours after pain medicine    HAs not had pain medication but 10/10 pain x 24hours, limited ROM  Answer Assessment - Initial Assessment Questions 1. ONSET: "When did the pain start?"    Yesterday morning 2. LOCATION: "Where is the pain located?"     Right shoulder 3. PAIN: "How bad is the pain?" (Scale 1-10; or mild, moderate, severe)   - MILD (1-3): doesn't interfere with normal activities   - MODERATE (4-7): interferes with normal activities (e.g., work or school) or awakens from sleep   - SEVERE (8-10): excruciating pain, unable to do any normal activities, unable to move arm at all due to pain     10/10, limited ROM, severe pain 4. WORK OR EXERCISE: "Has there been any recent work or exercise that involved this part of the body?"     Pulled self onto chair transferring out of bed at facility. 5. CAUSE: "What do you think is causing the shoulder pain?"     "Heard a pop", immediate severe pain 6. OTHER SYMPTOMS: "Do you have any other symptoms?" (e.g., neck pain, swelling, rash, fever, numbness, weakness)     Limited ROM, pain  worsening, generalized weakness, elevated BP  Protocols used: SHOULDER PAIN-A-AH

## 2018-03-24 NOTE — ED Provider Notes (Addendum)
[facility] EMERGENCY DEPARTMENT Provider Note   CSN: [id number] Arrival date & time: 03/24/18  1006     History   Chief Complaint Chief Complaint  Patient presents with  . Shoulder Pain    HPI Jonathon [name] is a 82 y.o. male.  Patient is an 82 year old male with a history of bradycardia, CHF, polyneuropathy, hypertension who is coming in today complaining of right shoulder pain.  He states for the last week or so he has had left side and back pain that makes it difficult for him to get up.  He states 2 days ago he was trying to get out of bed and he was reaching with his right arm towards a chair when he felt a pop in his collarbone area and from that time on he has had severe pain in that area anytime he tries to move his arm.  He denies any numbness or tingling in his hands.  His hand grip strength is normal.  He denies any falls or trauma.  The history is provided by the patient.  Shoulder Pain   This is a new problem. The current episode started 2 days ago. The problem occurs constantly. The problem has not changed since onset.The pain is present in the right shoulder. The quality of the pain is described as aching, sharp and constant. The pain is at a severity of 7/10. The pain is severe. Associated symptoms include limited range of motion. The symptoms are aggravated by activity. He has tried rest for the symptoms. The treatment provided no relief. There has been no history of extremity trauma.    Past Medical History:  Diagnosis Date  . Asthma   . BENIGN PROSTATIC HYPERTROPHY, HX OF 10/25/2008  . BRADYCARDIA 2005  . Chronic diastolic congestive heart failure ([place]) 06/05/2016  . CIDP (chronic inflammatory demyelinating polyneuropathy) ([place])   . HYPERTENSION 11/21/2006  . Iron deficiency anemia, unspecified 04/19/2013  . 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 PACEMAKER, PERMANENT 2005   Gen change 2014 Medtronic Adaptic L dual-chamber pacemaker, serial  [id number]   . SVT (supraventricular tachycardia) ([place])   . TOBACCO ABUSE 10/25/2008   Quit 2012    Patient Active Problem List   Diagnosis Date Noted  . Fall 09/05/2017  . Sepsis secondary to UTI ([place]) 09/05/2017  . Urinary retention 09/05/2017  . AKI (acute kidney injury) ([place]) 09/05/2017  . CAD (coronary artery disease) 09/05/2017  . Superficial vein thrombosis 08/19/2017  . Altered mental status   . Metabolic encephalopathy 27/74/1287  . Dementia ([place]) 08/04/2017  . Seizure ([place]) 08/01/2017  . Thiamine deficiency 05/27/2017  . B12 deficiency 04/30/2017  . Alcoholism ([place]) 01/28/2017  . Acute encephalopathy 12/28/2016  . Hepatic steatosis 06/05/2016  . Physical deconditioning 06/05/2016  . Chronic diastolic congestive heart failure ([place]) 06/05/2016  . Orthostatic syncope   . Elevated liver function tests   . LOC (loss of consciousness) ([place]) 08/10/2014  . CIDP (chronic inflammatory demyelinating polyneuropathy) ([place]) 08/08/2014  . Sustained SVT ([place]) 07/27/2014  . 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 Abnormal LFTs 08/06/2013  . Iron deficiency anemia 04/19/2013  . Muscle weakness 01/13/2013  . External hemorrhoids 03/23/2011  . Symptomatic bradycardia 10/25/2008  . 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 PACEMAKER, PERMANENT 10/25/2008  . Essential hypertension 11/21/2006  . BPH (benign prostatic hyperplasia) 11/21/2006  . History of colonic polyps 11/21/2006    Past Surgical History:  Procedure Laterality Date  . COLONOSCOPY  [id number]  . PACEMAKER INSERTION  2005  .  PERMANENT PACEMAKER GENERATOR CHANGE N/A 06/19/2012   Procedure: PERMANENT PACEMAKER GENERATOR CHANGE;  Surgeon: [name], MD; Medtronic Adaptic L dual-chamber pacemaker, serial #[id number]    . SKIN GRAFT Right 1962   wrist        Home Medications    Prior to Admission medications   Medication Sig Start Date End Date Taking? Authorizing Provider  Acetaminophen (TYLENOL) 325 MG CAPS Take  650 mg by mouth as needed.    [provider]  aspirin EC 81 MG tablet Take 81 mg by mouth daily.    [provider]  flecainide (TAMBOCOR) 50 MG tablet Take 75 mg by mouth daily as needed.    [provider]  furosemide (LASIX) 40 MG tablet Take 40 mg by mouth daily.    [provider]  Garlic 2202 MG CAPS Take by mouth daily.    [provider]  irbesartan (AVAPRO) 300 MG tablet Take 300 mg by mouth daily.    [provider]  Melatonin 3 MG CAPS Take 3 mg by mouth at bedtime.    [provider]  memantine (NAMENDA) 5 MG tablet Take 5 mg by mouth 2 (two) times daily.    [provider]  metoprolol tartrate (LOPRESSOR) 25 MG tablet Take 1 tablet (25 mg total) by mouth 2 (two) times daily. 08/16/17   Doreatha Lew, MD  polyethylene glycol [facility] / Floria Raveling) packet Take 17 g by mouth daily. 08/17/17   Doreatha Lew, MD  Saccharomyces boulardii (FLORASTOR PO) Take 250 mg by mouth 2 (two) times daily.    [provider]  tamsulosin (FLOMAX) 0.4 MG CAPS capsule Take 2 capsules (0.8 mg total) by mouth daily. 08/16/17   Doreatha Lew, MD  vitamin B-12 (CYANOCOBALAMIN) 500 MCG tablet Take 1,000 mcg by mouth daily.    [provider]  Vitamin D, Ergocalciferol, (DRISDOL) 50000 units CAPS capsule Take 50,000 Units by mouth every 7 (seven) days.    [provider]    Family History Family History  Problem Relation Age of Onset  . Heart attack Father        Died, 83  . Kidney disease Father   . Parkinson's disease Mother        Died, 70  . Breast cancer Sister        Living, 71  . Diabetes type II Brother        Living, 92  . Breast cancer Sister        Living, 1  . Diabetes Daughter        Living, 9  . Diabetes Son        Living, 39  . Ovarian cancer Daughter   . Colon cancer Neg Hx   . Rectal cancer Neg Hx   . Stomach cancer Neg Hx     Social History Social History    Tobacco Use  . Smoking status: Former Smoker    Packs/day: 0.25    Years: 46.00    Pack years: 11.50    Types: Cigarettes    Start date: 03/16/1965    Last attempt to quit: 06/15/2010    Years since quitting: 7.7  . Smokeless tobacco: Never Used  . Tobacco comment: quit 3 years ago  Substance Use Topics  . Alcohol use: No    Alcohol/week: 0.0 standard drinks    Comment: 4 beers per day  . Drug use: No     Allergies   Lexapro [escitalopram oxalate]  Review of Systems Review of Systems  All other systems reviewed and are negative.    Physical Exam Updated Vital Signs BP (!) 168/88 (BP Location: Left Arm)   Pulse 65   Temp (!) 97.5 F (36.4 C) (Oral)   Resp 16   Ht 5\' 10"  (1.778 m)   Wt 86.2 kg   SpO2 98%   BMI 27.26 kg/m   Physical Exam  Constitutional: He is oriented to person, place, and time. He appears well-developed and well-nourished. No distress.  HENT:  Head: Normocephalic and atraumatic.  Eyes: Pupils are equal, round, and reactive to light.  Neck: Normal range of motion.  Cardiovascular: Normal rate and intact distal pulses.  Pulmonary/Chest: Effort normal.  Musculoskeletal: He exhibits tenderness.       Right shoulder: He exhibits decreased range of motion, tenderness, bony tenderness and swelling. He exhibits no deformity, normal pulse and normal strength.       Arms: Neurological: He is alert and oriented to person, place, and time.  5 out of 5 hand grip strength on the right with normal sensation in the right upper extremity  Skin: Skin is warm and dry.  Psychiatric: He has a normal mood and affect. His behavior is normal.  Nursing note and vitals reviewed.    ED Treatments / Results  Labs (all labs ordered are listed, but only abnormal results are displayed) Labs Reviewed - No data to display  EKG None  Radiology Dg Shoulder Right  Result Date: 03/24/2018 CLINICAL DATA:  Shoulder pain following injury 2 weeks ago, initial  encounter EXAM: RIGHT SHOULDER - 2+ VIEW COMPARISON:  09/05/2017. FINDINGS: Distal right clavicular fracture is noted with some downward displacement of the distal fracture fragment. No acromioclavicular separation is seen. There is a lytic lesion involving the right fifth rib posterolaterally as well as a fracture of the right sixth rib posteriorly. Some expansile lesions are seen laterally in the right sixth rib as well. IMPRESSION: Distal right clavicular fracture with some irregular margins likely related to a pathologic fracture. There are abnormalities in the right ribcage as described above also suggestive of metastatic disease. These were not present on a chest x-ray from 09/05/2017. Further nonemergent workup is recommended. Electronically Signed   By: Inez Catalina M.D.   On: 03/24/2018 11:07    Procedures Procedures (including critical care time)  Medications Ordered in ED Medications - No data to display   Initial Impression / Assessment and Plan / ED Course  I have reviewed the triage vital signs and the nursing notes.  Pertinent labs & imaging results that were available during my care of the patient were reviewed by me and considered in my medical decision making (see chart for details).     Pleasant elderly gentleman presenting today with pain in the right distal clavicle.  He states he felt a pop when he was reaching for a chair trying to pull himself out of bed 2 days ago.  Since that time he has not been able to lift his arm because it elicits the pain.  Neurovascularly intact.  He denies any trauma.  Will get a plain film to rule out clavicle fracture.  11:49 AM X-ray confirms a right distal clavicle fracture however there is concern for possible pathologic fracture.  Also there are multiple lesions present in the right ribs concerning for metastatic disease.  Findings were discussed with the patient.  Spoke with his PCPs office and they plan on getting him in for an  urgent  follow-up.  And has no other complaints at this time vital signs are reassuring.  Will attempt to get a hold of his power of attorney to discuss the findings.  pt was placed in a sling Discussed findings with his daughter [name]. Final Clinical Impressions(s) / ED Diagnoses   Final diagnoses:  Closed displaced fracture of acromial end of right clavicle, initial encounter  Lytic bone lesions on xray    ED Discharge Orders    None       Blanchie Dessert, MD 03/24/18 1151    Blanchie Dessert, MD 03/24/18 1208

## 2018-03-24 NOTE — Telephone Encounter (Signed)
This encounter was created in error - please disregard.

## 2018-03-24 NOTE — Telephone Encounter (Signed)
Received a phone call from ER, Dr. Maryan Rued. Mr. Spoelstra was taken to the ER because complaints of right shoulder pain.  X-ray showed a clavicular fracture as well as other bone abnormalities that raise concern for metastatic disease.  Right shoulder X ray: Distal right clavicular fracture with some irregular margins likely related to a pathologic fracture. There are abnormalities in the right ribcage as described above also suggestive of metastatic disease. These were not present on a chest.  He has an appointment on 04/06/2017 with PCP, Dr. Volanda Napoleon.  Will try to move appt to be seen earlier.   Betty Martinique, MD

## 2018-03-24 NOTE — Discharge Instructions (Signed)
Wear the sling at all times except to shower.  Do not lift or hold anything in the right hand.  You will have to use your cane with your left hand.  Physical therapy was ordered.  Take Tylenol every 6 hours for the next 1 week and then you can take it as needed for pain

## 2018-03-25 ENCOUNTER — Ambulatory Visit (INDEPENDENT_AMBULATORY_CARE_PROVIDER_SITE_OTHER): Payer: Medicare Other | Admitting: Family Medicine

## 2018-03-25 ENCOUNTER — Encounter: Payer: Self-pay | Admitting: Family Medicine

## 2018-03-25 ENCOUNTER — Telehealth: Payer: Self-pay | Admitting: *Deleted

## 2018-03-25 VITALS — BP 148/94 | HR 62 | Temp 98.0°F | Wt 185.0 lb

## 2018-03-25 DIAGNOSIS — I2510 Atherosclerotic heart disease of native coronary artery without angina pectoris: Secondary | ICD-10-CM | POA: Diagnosis not present

## 2018-03-25 DIAGNOSIS — M899 Disorder of bone, unspecified: Secondary | ICD-10-CM

## 2018-03-25 DIAGNOSIS — D518 Other vitamin B12 deficiency anemias: Secondary | ICD-10-CM | POA: Diagnosis not present

## 2018-03-25 DIAGNOSIS — N390 Urinary tract infection, site not specified: Secondary | ICD-10-CM | POA: Diagnosis not present

## 2018-03-25 DIAGNOSIS — I10 Essential (primary) hypertension: Secondary | ICD-10-CM | POA: Diagnosis not present

## 2018-03-25 DIAGNOSIS — N401 Enlarged prostate with lower urinary tract symptoms: Secondary | ICD-10-CM | POA: Diagnosis not present

## 2018-03-25 DIAGNOSIS — Z125 Encounter for screening for malignant neoplasm of prostate: Secondary | ICD-10-CM | POA: Diagnosis not present

## 2018-03-25 DIAGNOSIS — S42031D Displaced fracture of lateral end of right clavicle, subsequent encounter for fracture with routine healing: Secondary | ICD-10-CM

## 2018-03-25 DIAGNOSIS — S5292XA Unspecified fracture of left forearm, initial encounter for closed fracture: Secondary | ICD-10-CM | POA: Diagnosis not present

## 2018-03-25 DIAGNOSIS — K5900 Constipation, unspecified: Secondary | ICD-10-CM | POA: Diagnosis not present

## 2018-03-25 DIAGNOSIS — I5030 Unspecified diastolic (congestive) heart failure: Secondary | ICD-10-CM | POA: Diagnosis not present

## 2018-03-25 DIAGNOSIS — F0390 Unspecified dementia without behavioral disturbance: Secondary | ICD-10-CM | POA: Diagnosis not present

## 2018-03-25 DIAGNOSIS — R339 Retention of urine, unspecified: Secondary | ICD-10-CM | POA: Diagnosis not present

## 2018-03-25 DIAGNOSIS — Z950 Presence of cardiac pacemaker: Secondary | ICD-10-CM | POA: Diagnosis not present

## 2018-03-25 DIAGNOSIS — E559 Vitamin D deficiency, unspecified: Secondary | ICD-10-CM | POA: Diagnosis not present

## 2018-03-25 LAB — CBC WITH DIFFERENTIAL/PLATELET
BASOS ABS: 0.1 10*3/uL (ref 0.0–0.1)
Basophils Relative: 0.8 % (ref 0.0–3.0)
EOS PCT: 1.7 % (ref 0.0–5.0)
Eosinophils Absolute: 0.1 10*3/uL (ref 0.0–0.7)
HEMATOCRIT: 33.6 % — AB (ref 39.0–52.0)
Hemoglobin: 11.3 g/dL — ABNORMAL LOW (ref 13.0–17.0)
LYMPHS PCT: 33.3 % (ref 12.0–46.0)
Lymphs Abs: 2.2 10*3/uL (ref 0.7–4.0)
MCHC: 33.5 g/dL (ref 30.0–36.0)
MCV: 81.3 fl (ref 78.0–100.0)
MONOS PCT: 7.9 % (ref 3.0–12.0)
Monocytes Absolute: 0.5 10*3/uL (ref 0.1–1.0)
NEUTROS PCT: 56.3 % (ref 43.0–77.0)
Neutro Abs: 3.7 10*3/uL (ref 1.4–7.7)
Platelets: 193 10*3/uL (ref 150.0–400.0)
RBC: 4.14 Mil/uL — AB (ref 4.22–5.81)
RDW: 15 % (ref 11.5–15.5)
WBC: 6.6 10*3/uL (ref 4.0–10.5)

## 2018-03-25 LAB — COMPREHENSIVE METABOLIC PANEL
ALBUMIN: 3.4 g/dL — AB (ref 3.5–5.2)
ALK PHOS: 90 U/L (ref 39–117)
ALT: 10 U/L (ref 0–53)
AST: 21 U/L (ref 0–37)
BILIRUBIN TOTAL: 0.5 mg/dL (ref 0.2–1.2)
BUN: 25 mg/dL — AB (ref 6–23)
CO2: 33 mEq/L — ABNORMAL HIGH (ref 19–32)
Calcium: 13.8 mg/dL (ref 8.4–10.5)
Chloride: 94 mEq/L — ABNORMAL LOW (ref 96–112)
Creatinine, Ser: 1.48 mg/dL (ref 0.40–1.50)
GFR: 58.79 mL/min — ABNORMAL LOW (ref >60.00)
GLUCOSE: 100 mg/dL — AB (ref 70–99)
POTASSIUM: 3.7 meq/L (ref 3.5–5.1)
SODIUM: 135 meq/L (ref 135–145)
TOTAL PROTEIN: 9.4 g/dL — AB (ref 6.0–8.3)

## 2018-03-25 NOTE — Telephone Encounter (Signed)
CRITICAL VALUE STICKER  CRITICAL VALUE:  RECEIVER (on-site recipient of call): Apolonio Schneiders  DATE & TIME NOTIFIED: 03/25/18  MESSENGER (representative from lab):Esperanza Richters  MD NOTIFIED: Volanda Napoleon  TIME OF NOTIFICATION: 03/25/18 4:47  RESPONSE:

## 2018-03-25 NOTE — Patient Instructions (Signed)
Clavicle Fracture A clavicle fracture is a break in the long bone that connects the shoulder to the chest wall (clavicle). The clavicle is also called the collarbone. A clavicle fracture is a common injury that can happen at any age. What are the causes? Common causes of a clavicle fracture include:  A hard, direct hit (blow) to the shoulder.  A car accident.  A fall, especially if you try to break your fall with an outstretched arm.  What increases the risk? You are more likely to develop this condition if:  You are younger than 31 or older than 62. Most clavicle fractures happen to people who are younger than 25.  You are a male.  You play contact sports.  What are the signs or symptoms? Symptoms of this condition include:  Pain.  Difficulty moving the arm.  A shoulder that drops downward and forward.  Pain when trying to lift the shoulder.  Bruising, swelling, and tenderness over the clavicle.  A grinding noise when you try to move the shoulder.  A bump over the clavicle.  How is this diagnosed? This condition is diagnosed based on:  Your medical history.  A physical exam.  X-rays to determine the position of your clavicle.  How is this treated? Treatment for this condition depends on the position of your clavicle after the fracture:  If the broken ends of the bone are not out of place, your health care provider may put your arm in a sling.  If the broken ends of the bone are out of place, you may need surgery. Surgery may involve placing screws, pins, or plates to keep your clavicle stable while it heals.  When your health care provider thinks your fracture has healed enough, you may have to do physical therapy to regain normal movement and build up your arm strength. Follow these instructions at home: If you have a sling:  Wear the sling as told by your health care provider. Remove it only as told by your health care provider.  Loosen the sling if your  fingers tingle, become numb, or turn cold and blue.  Do not lift your arm. Keep it across your chest.  Keep the sling clean.  Ask your health care provider if you may remove the sling for bathing. ? If your sling is not waterproof, do not let it get wet. Cover the sling with a watertight covering if you take a bath or a shower while wearing it. ? If you may remove your sling when you take a bath or a shower, keep your shoulder in the same position as when the sling is on. Managing pain, stiffness, and swelling  If directed, put ice on the injured area: ? If you have a removable sling, remove it as told by your health care provider. ? Put ice in a plastic bag. ? Place a towel between your skin and the bag. ? Leave the ice on for 20 minutes, 2-3 times a day. Activity  Avoid activities that make your symptoms worse for 4-6 weeks, or as long as directed.  Ask your health care provider when it is safe for you to drive.  Do exercises as told by your health care provider. General instructions  Do not use any products that contain nicotine or tobacco, such as cigarettes and e-cigarettes. These can delay bone healing. If you need help quitting, ask your health care provider.  Take over-the-counter and prescription medicines only as told by your health care provider.  Keep all follow-up visits as told by your health care provider. This is important. Contact a health care provider if:  Your medicine is not helping to relieve pain and swelling. Get help right away if:  Your arm is numb, cold, or pale, even when your splint is loose. Summary  The clavicle, also called the collarbone, is the long bone that connects the shoulder to the chest wall.  Treatment for this condition depends on the position of your clavicle after the fracture.  You may need to do physical therapy after your injury has healed enough.  If you have a sling, wear the sling as told by your health care  provider. This information is not intended to replace advice given to you by your health care provider. Make sure you discuss any questions you have with your health care provider. Document Released: 02/06/2005 Document Revised: 03/18/2016 Document Reviewed: 03/18/2016 Elsevier Interactive Patient Education  2017 [employer].  How to Use a Sling A sling is a type of hanging bandage. You wear it around your neck to protect an injured arm, shoulder, or other body part. You may need to wear a sling to keep you from moving the injured body part while it heals. Keeping the injured part of your body still reduces pain and speeds up healing. Your doctor may suggest you use a sling if you have:  A broken arm.  A broken collarbone.  A shoulder injury.  Surgery.  What are the risks? Wearing a sling the wrong way can:  Make your injury worse.  Cause stiffness or numbness.  Affect blood circulation in your arm and hand. This can cause tingling or numbness in your fingers or hands.  How to use a sling The way that you should use a sling depends on your injury. It is important that you follow all of your doctor's instructions for your injury. Also follow these general suggestions:  Wear the sling so that your arm bends 90 degrees at the elbow. That is like a right angle or the shape of a capital letter "L." The sling should also support your wrist and hand.  Try not to move your arm.  Do not lie down flat on your back while you have to wear a sling. Sleep in a recliner or use pillows to raise your upper body in bed.  Do not twist, raise, or move your arm in a way that could make your injury worse.  Do not lean on your arm while you have to wear a sling.  Do not lift anything while you have to wear a sling.  Get help if:  You have bruising, swelling, or pain that is getting worse.  Your pain medicine is not helping.  You have a fever. Get help right away if:  Your fingers are numb  or tingling.  Your fingers turn blue or feel cold to the touch.  You cannot control the bleeding from your injury.  You are short of breath. This information is not intended to replace advice given to you by your health care provider. Make sure you discuss any questions you have with your health care provider. Document Released: 07/24/2009 Document Revised: 10/05/2015 Document Reviewed: 03/02/2014 Elsevier Interactive Patient Education  [employer].

## 2018-03-26 LAB — PSA, TOTAL AND FREE
PSA, % Free: 42 % (calc) (ref >25)
PSA, Free: 0.5 ng/mL
PSA, Total: 1.2 ng/mL (ref <=4.0)

## 2018-03-26 NOTE — Telephone Encounter (Signed)
Noted  

## 2018-03-27 ENCOUNTER — Encounter: Payer: Self-pay | Admitting: Hematology

## 2018-03-27 ENCOUNTER — Telehealth: Payer: Self-pay | Admitting: Hematology

## 2018-03-27 NOTE — Telephone Encounter (Signed)
Addressed.

## 2018-03-27 NOTE — Progress Notes (Signed)
Subjective:    Patient ID: Jonathon [name], male    DOB: 07-03-1935, 82 y.o.   MRN: [id number]  No chief complaint on file. Pt accompanied by his daughter, Jonathon [name].  HPI Patient was seen today for ED f/u.  Seen 11/12 in ED for R shoulder pain after reaching up, subsequently dx'd with closed displaced fx of acromial end of R clavicle.  Xray R shoulder also with lytic bone lesions.  Since ED visit, pt doing well.  Wearing sling on R arm, denies pain.  Taking Tylenol PRN.  Patient and daughter interested in pursuing cause of lytic lesions noted on x-ray.  Pt also states he would like to establish care with this provider moving forward.  Past Medical History:  Diagnosis Date  . Asthma   . BENIGN PROSTATIC HYPERTROPHY, HX OF 10/25/2008  . BRADYCARDIA 2005  . Chronic diastolic congestive heart failure ([place]) 06/05/2016  . CIDP (chronic inflammatory demyelinating polyneuropathy) ([place])   . HYPERTENSION 11/21/2006  . Iron deficiency anemia, unspecified 04/19/2013  . 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 PACEMAKER, PERMANENT 2005   Gen change 2014 Medtronic Adaptic L dual-chamber pacemaker, serial [id number]   . SVT (supraventricular tachycardia) ([place])   . TOBACCO ABUSE 10/25/2008   Quit 2012    Allergies  Allergen Reactions  . Lexapro [Escitalopram Oxalate]     3/30-08/16/17 ? Serotonin Syndrome    ROS General: Denies fever, chills, night sweats, changes in weight, changes in appetite HEENT: Denies headaches, ear pain, changes in vision, rhinorrhea, sore throat CV: Denies CP, palpitations, SOB, orthopnea Pulm: Denies SOB, cough, wheezing GI: Denies abdominal pain, nausea, vomiting, diarrhea, constipation GU: Denies dysuria, hematuria, frequency, vaginal discharge Msk: Denies muscle cramps, joint pains  + R shoulder pain Neuro: Denies weakness, numbness, tingling Skin: Denies rashes, bruising Psych: Denies depression, anxiety, hallucinations     Objective:    Blood pressure (!)  148/94, pulse 62, temperature 98 F (36.7 C), temperature source Oral, weight 185 lb (83.9 kg), SpO2 96 %.  Gen. Pleasant, well-nourished, in no distress, normal affect  Neck: No JVD, no thyromegaly, no cervical lymphadenopathy. Lungs: no accessory muscle use, CTAB, no wheezes or rales Cardiovascular: RRR, no m/r/g, no peripheral edema Musculoskeletal: Wearing sling on right arm.  Grip strength bilaterally 5/5 .  no deformities, no cyanosis or clubbing, normal tone Neuro:  A&Ox3, CN II-XII intact, in wheelchair  Wt Readings from Last 3 Encounters:  03/25/18 185 lb (83.9 kg)  03/24/18 190 lb (86.2 kg)  03/20/18 189 lb (85.7 kg)    Lab Results  Component Value Date   WBC 6.6 03/25/2018   HGB 11.3 (L) 03/25/2018   HCT 33.6 (L) 03/25/2018   PLT 193.0 03/25/2018   GLUCOSE 100 (H) 03/25/2018   CHOL 202 (H) 01/14/2013   TRIG 206.0 (H) 01/14/2013   HDL 53.60 01/14/2013   LDLDIRECT 126.6 01/14/2013   ALT 10 03/25/2018   AST 21 03/25/2018   NA 135 03/25/2018   K 3.7 03/25/2018   CL 94 (L) 03/25/2018   CREATININE 1.48 03/25/2018   BUN 25 (H) 03/25/2018   CO2 33 (H) 03/25/2018   TSH 1.28 01/08/2017   PSA 1.53 12/01/2008   INR 1.04 08/10/2014   HGBA1C 6.0 11/05/2013    Assessment/Plan:  Lytic bone lesions on xray  -discussed concern for malignancy. -will obtain labs  - Plan: Ambulatory referral to Oncology, CBC with Differential/Platelet, Comprehensive metabolic panel, PSA, Total and Free, CT Chest W Contrast, CT ABDOMEN PELVIS W  CONTRAST  Closed displaced fracture of acromial end of right clavicle with routine healing, subsequent encounter  -continue use of sling -Tylenol PRN for pain/discomfort - Plan: CT Chest W Contrast  Essential hypertension -Elevated at this visit likely 2/2 pain/anxiety about new medical concern -Continue to check BP daily -Continue current meds  Follow-up PRN in the next 2 to 4 weeks  [name], MD

## 2018-03-27 NOTE — Telephone Encounter (Signed)
New referral received from Dr. [name] for lytic bone lesions. Cld and spoke to Mr. Kunkle daughter, [name], to schedule an appt to see D. Kale on 12/3 at 11am. Letter mailed.

## 2018-03-29 ENCOUNTER — Encounter: Payer: Self-pay | Admitting: Family Medicine

## 2018-03-29 DIAGNOSIS — M899 Disorder of bone, unspecified: Secondary | ICD-10-CM | POA: Insufficient documentation

## 2018-03-29 DIAGNOSIS — S42031D Displaced fracture of lateral end of right clavicle, subsequent encounter for fracture with routine healing: Secondary | ICD-10-CM | POA: Insufficient documentation

## 2018-03-31 ENCOUNTER — Ambulatory Visit (INDEPENDENT_AMBULATORY_CARE_PROVIDER_SITE_OTHER)
Admission: RE | Admit: 2018-03-31 | Discharge: 2018-03-31 | Disposition: A | Discharge status: Home / Self Care | Payer: Medicare Other | Source: Ambulatory Visit | Attending: Family Medicine | Admitting: Family Medicine

## 2018-03-31 DIAGNOSIS — S42001A Fracture of unspecified part of right clavicle, initial encounter for closed fracture: Secondary | ICD-10-CM | POA: Diagnosis not present

## 2018-03-31 DIAGNOSIS — K8020 Calculus of gallbladder without cholecystitis without obstruction: Secondary | ICD-10-CM | POA: Diagnosis not present

## 2018-03-31 DIAGNOSIS — M899 Disorder of bone, unspecified: Secondary | ICD-10-CM

## 2018-03-31 DIAGNOSIS — R911 Solitary pulmonary nodule: Secondary | ICD-10-CM | POA: Diagnosis not present

## 2018-03-31 DIAGNOSIS — S42031D Displaced fracture of lateral end of right clavicle, subsequent encounter for fracture with routine healing: Secondary | ICD-10-CM | POA: Diagnosis not present

## 2018-03-31 MED ORDER — IOPAMIDOL (ISOVUE-300) INJECTION 61%
100.0000 mL | Freq: Once | INTRAVENOUS | Status: AC | PRN
  Administered 2018-03-31: 100 mL via INTRAVENOUS

## 2018-04-02 ENCOUNTER — Ambulatory Visit (INDEPENDENT_AMBULATORY_CARE_PROVIDER_SITE_OTHER): Payer: Medicare Other | Admitting: Family Medicine

## 2018-04-02 ENCOUNTER — Encounter: Payer: Self-pay | Admitting: Family Medicine

## 2018-04-02 VITALS — BP 168/90 | HR 70 | Temp 98.2°F

## 2018-04-02 DIAGNOSIS — S2249XA Multiple fractures of ribs, unspecified side, initial encounter for closed fracture: Secondary | ICD-10-CM | POA: Diagnosis not present

## 2018-04-02 DIAGNOSIS — M899 Disorder of bone, unspecified: Secondary | ICD-10-CM | POA: Diagnosis not present

## 2018-04-02 DIAGNOSIS — I2510 Atherosclerotic heart disease of native coronary artery without angina pectoris: Secondary | ICD-10-CM | POA: Diagnosis not present

## 2018-04-02 MED ORDER — TRAMADOL HCL 50 MG PO TABS: 50.0000 mg | ORAL_TABLET | Freq: Three times a day (TID) | ORAL | 0 refills | Status: AC | PRN

## 2018-04-02 NOTE — Progress Notes (Signed)
Subjective:    Patient ID: Jonathon [name], male    DOB: [birth year], 82 y.o.   MRN: [id number]  Chief Complaint  Patient presents with  . Follow-up    CT Results  Pt accompanied by Jonathon [name], his daughter.  HPI Patient was seen today for review of CT scan.  Pt endorses some pain in back and with deep breaths, tylenol not helping.  Pt stated his understanding of his current health.  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  Discussed keeping appt with Oncology.  Jonathon [name] to get pt a wheelchair.  Per pt's daughter Jonathon [name] needs a note faxed about d/c'd meds. [employer] fax [phone] Cristopher [place]. Need order to d/c Tylenol.  Past Medical History:  Diagnosis Date  . Asthma   . BENIGN PROSTATIC HYPERTROPHY, HX OF 10/25/2008  . BRADYCARDIA 2005  . Chronic diastolic congestive heart failure ([place]) 06/05/2016  . CIDP (chronic inflammatory demyelinating polyneuropathy) ([place])   . HYPERTENSION 11/21/2006  . Iron deficiency anemia, unspecified 04/19/2013  . 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 PACEMAKER, PERMANENT 2005   Gen change 2014 Medtronic Adaptic L dual-chamber pacemaker, serial [id number]   . SVT (supraventricular tachycardia) ([place])   . TOBACCO ABUSE 10/25/2008   Quit 2012    Allergies  Allergen Reactions  . Lexapro [Escitalopram Oxalate]     3/30-08/16/17 ? Serotonin Syndrome    ROS General: Denies fever, chills, night sweats, changes in weight, changes in appetite HEENT: Denies headaches, ear pain, changes in vision, rhinorrhea, sore throat CV: Denies CP, palpitations, SOB, orthopnea Pulm: Denies SOB, cough, wheezing GI: Denies abdominal pain, nausea, vomiting, diarrhea, constipation GU: Denies dysuria, hematuria, frequency, vaginal discharge Msk: Denies muscle cramps, joint pains   +back pain, rib pain, shoulder pain Neuro: Denies  weakness, numbness, tingling Skin: Denies rashes, bruising Psych: Denies depression, anxiety, hallucinations    Objective:    Blood pressure (!) 168/90, pulse 70, temperature 98.2 F (36.8 C), temperature source Oral, SpO2 98 %.  Gen. Pleasant, well-nourished, in no distress, normal affect, tearful, slightly depressed. Lungs: no accessory muscle use Cardiovascular: RRR,  no peripheral edema Neuro:  A&Ox3, CN II-XII intact, gait not assessed, sitting in wheelchair Skin:  Warm, no lesions/ rash   Wt Readings from Last 3 Encounters:  03/25/18 185 lb (83.9 kg)  03/24/18 190 lb (86.2 kg)  03/20/18 189 lb (85.7 kg)    Lab Results  Component Value Date   WBC 6.6 03/25/2018   HGB 11.3 (L) 03/25/2018   HCT 33.6 (L) 03/25/2018   PLT 193.0 03/25/2018   GLUCOSE 100 (H) 03/25/2018   CHOL 202 (H) 01/14/2013   TRIG 206.0 (H) 01/14/2013   HDL 53.60 01/14/2013   LDLDIRECT 126.6 01/14/2013   ALT 10 03/25/2018   AST 21 03/25/2018   NA 135 03/25/2018   K 3.7 03/25/2018   CL 94 (L) 03/25/2018   CREATININE 1.48 03/25/2018   BUN 25 (H) 03/25/2018   CO2 33 (H) 03/25/2018   TSH 1.28 01/08/2017   PSA 1.53 12/01/2008   INR 1.04 08/10/2014   HGBA1C 6.0 11/05/2013    Assessment/Plan:  Lytic bone lesions on 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 -discussed f/u with Oncology.  Pt's daughter called during [place], appt Dec 3 at 11am at [facility] -will start tramadol for pain.  Pt advised to make provider aware if not  helping. - Plan: traMADol (ULTRAM) 50 MG tablet  Multiple fractures of rib involving four or more ribs -2/2 lytic lesions -start tramadol  F/u prn  [name], MD

## 2018-04-06 ENCOUNTER — Encounter: Payer: Self-pay | Admitting: Family Medicine

## 2018-04-07 ENCOUNTER — Other Ambulatory Visit: Payer: Self-pay

## 2018-04-13 NOTE — Progress Notes (Signed)
HEMATOLOGY/ONCOLOGY CONSULTATION NOTE  Date of Service: 04/14/2018  Patient Care Team: [name], MD as PCP - General (Family Medicine) [name], MD (Cardiology) [name], MD (Cardiology)  CHIEF COMPLAINTS/PURPOSE OF CONSULTATION:  Lytic bone lesions   HISTORY OF PRESENTING ILLNESS:   Jonathon [name] is a wonderful 82 y.o. male who has been referred to [place] by Dr. [name] for evaluation and management of Lytic bone lesions. He is accompanied today by his daughter. The pt reports that he is doing well overall. He is currently a resident at [facility] in [place], [place].   The pt presented to the ED on 03/24/18 with a right distal clavicle fracture, which occurred after trying to get out of bed and pushing himself up with his right arm. He notes that his energy has been noticeably decreased for the last month. He also notes that his lower back has a slight sting when trying to stand up from sitting. He also notes some pain in his lower, right chest wall, which is exacerbated when coughing. The pt has been taking 73m Tramadol which is controlling his pain. He does endorse some weakening of both of his lower extremities in the past 1-2 months, which he attributes somewhat to his recent lower back pain.   The pt denies any bowel abnormalities recently. He notes that he stays very well hydrated, and urinates 4-5 times a night, and does have BPH, and takes Flomax. The pt denies any recent changes in his urination habits.   The pt notes that his appetite decreased for a few months, until he began assisted living. He has lost 9 pounds in the last week.   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 He now uses a walker to ambulate. He notes that the CIDP symptoms are primarily located to his legs. He sees Dr. [name] [facility]. He took steroids  without any symptomatic relief. IVIG was cost-prohibitive for the patient, but was recommended.    Regarding the patient's anemia in April 2019, he denies any obvious bleeding at that time, and denies surgeries.    The pt notes that his last colonoscopy, in 2015, was not concerning, which is corroborated by chart review.   The pt and daughter deny any concerns for memory issues, and the daughter adds his memory is "elephant-like". They are not sure why Namenda is on his medication list.   The pt denies any recent dental procedures, or concerns for dental issues. He has maintained every 6 month dental cleanings for all of his life.   Of note prior to the patient's visit today, pt has had a CT C/A/P completed on 03/31/18 with results revealing IMPRESSION: 1. Lytic lesion throughout all visualized vertebra from the lower cervical spine through the sacrum. Epidural extension of tumor T2, T3 and L2 level. Right L3-4 foraminal extension tumor. 2. Lytic lesions involving majority of ribs. Multiple pathologic rib fractures with bony expansion/sub pleural extension of tumor at several levels. 3. Lytic lesions of the scapula, hips bilaterally and pelvis bilaterally. With further progression of tumor, patient may be at risk for pathologic fractures. Pathologic fracture right clavicle incompletely assessed. 4. Circumferential narrowing sigmoid colon. Question possibility of underlying mass (series 2, image 97). Alternatively this could represent muscular hypertrophy/peristalsis. 5. Gastric antrum circumferential narrowing. Cannot exclude mass although this  may be related to peristalsis. 6. No primary lung malignancy noted. 7. Matted aortic pulmonary window lymph nodes. 8. Circumferential urinary bladder wall thickening greater anterior dome region. Right lateral bladder diverticulum. 9. 5 mm low-density lesion pancreatic body (series 5, image 10) unchanged. This is felt unlikely to be a primary malignancy. 10.  Right parapelvic 2.2 cm cyst without significant change. Scattered low-density renal lesions bilaterally too small to adequately characterize. Some were present previously. Statistically these are likely cysts. 11. Stable adrenal gland hyperplasia. 12. Gynecomastia. 13. Prostate gland causes slight impression upon the bladder base. 14.  Aortic Atherosclerosis. 15. Sequential pacemaker in place. 16. Gallstones.  Most recent lab results (03/25/18) of CBC w/diff and CMP is as follows: all values are WNL except for RBC at 4.14, HGB at 11.3, HCT at 33.6, Chloride at 94, CO2 at 33, Glucose at 100, BUN at 25, Total Protein at 9.4, Albumin at 3.4, Calcium at 13.8, GFR at 58. 03/25/18 PSA, Total was normal at 1.2  On review of systems, pt reports positional lower back pain, lower right chest wall pain, recently decreased energy levels, relatively weaker appetite, weight loss, right clavicle pain, weakening of the lower extremities, moving his bowels well, and denies changes in urination, abdominal pains, changes in breathing, changes in bowel habits, pain along the spine, leg swelling, and any other symptoms.   MEDICAL HISTORY:  Past Medical History:  Diagnosis Date  . Asthma   . BENIGN PROSTATIC HYPERTROPHY, HX OF 10/25/2008  . Bone metastases ([place]) 04/14/2018  . BRADYCARDIA 2005  . Chronic diastolic congestive heart failure ([place]) 06/05/2016  . CIDP (chronic inflammatory demyelinating polyneuropathy) ([place])   . HYPERTENSION 11/21/2006  . Iron deficiency anemia, unspecified 04/19/2013  . 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 PACEMAKER, PERMANENT 2005   Gen change 2014 Medtronic Adaptic L dual-chamber pacemaker, serial [id number]   . SVT (supraventricular tachycardia) ([place])   . TOBACCO ABUSE 10/25/2008   Quit 2012    SURGICAL HISTORY: Past Surgical History:  Procedure Laterality Date  . COLONOSCOPY  [id number]  . PACEMAKER INSERTION  2005  . PERMANENT PACEMAKER GENERATOR CHANGE N/A 06/19/2012     Procedure: PERMANENT PACEMAKER GENERATOR CHANGE;  Surgeon: [name], MD; Medtronic Adaptic L dual-chamber pacemaker, serial #[id number]    . SKIN GRAFT Right 1962   wrist    SOCIAL HISTORY: Social History   Socioeconomic History  . Marital status: Divorced    Spouse name: Not on file  . Number of children: 4  . Years of education: Not on file  . Highest education level: Not on file  Occupational History  . Occupation: ENVIROMENTAL Location manager: [employer]  . Occupation: retired  Scientific laboratory technician  . Financial resource strain: Not on file  . Food insecurity:    Worry: Not on file    Inability: Not on file  . Transportation needs:    Medical: Not on file    Non-medical: Not on file  Tobacco Use  . Smoking status: Former Smoker    Packs/day: 0.25    Years: 46.00    Pack years: 11.50    Types: Cigarettes    Start date: 03/16/1965    Last attempt to quit: 06/15/2010    Years since quitting: 7.8  . Smokeless tobacco: Never Used  . Tobacco comment: quit 3 years ago  Substance and Sexual Activity  . Alcohol use: No    Alcohol/week: 0.0 standard drinks    Comment: 4  beers per day  . Drug use: No  . Sexual activity: Not on file  Lifestyle  . Physical activity:    Days per week: Not on file    Minutes per session: Not on file  . Stress: Not on file  Relationships  . Social connections:    Talks on phone: Not on file    Gets together: Not on file    Attends religious service: Not on file    Active member of club or organization: Not on file    Attends meetings of clubs or organizations: Not on file    Relationship status: Not on file  . Intimate partner violence:    Fear of current or ex partner: Not on file    Emotionally abused: Not on file    Physically abused: Not on file    Forced sexual activity: Not on file  Other Topics Concern  . Not on file  Social History Narrative   Has relocated from [place] in 2007. 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  Lives alone  in a one-story home.    FAMILY HISTORY: Family History  Problem Relation Age of Onset  . Heart attack Father        Died, 64  . Kidney disease Father   . Parkinson's disease Mother        Died, 64  . Breast cancer Sister        Living, 3  . Diabetes type II Brother        Living, 33  . Breast cancer Sister        Living, 20  . Diabetes Daughter        Living, 83  . Diabetes Son        Living, 22  . Ovarian cancer Daughter   . Colon cancer Neg Hx   . Rectal cancer Neg Hx   . Stomach cancer Neg Hx     ALLERGIES:  is allergic to lexapro [escitalopram oxalate].  MEDICATIONS:  Current Outpatient Medications  Medication Sig Dispense Refill  . aspirin EC 81 MG tablet Take 81 mg by mouth daily.    . flecainide (TAMBOCOR) 50 MG tablet Take 75 mg by mouth daily as needed.    . furosemide (LASIX) 40 MG tablet Take 40 mg by mouth daily.    . Garlic 2423 MG CAPS Take by mouth daily.    . irbesartan (AVAPRO) 300 MG tablet Take 300 mg by mouth daily.    . Melatonin 3 MG CAPS Take 3 mg by mouth at bedtime.    . memantine (NAMENDA) 5 MG tablet Take 5 mg by mouth 2 (two) times daily.    . metoprolol tartrate (LOPRESSOR) 25 MG tablet Take 1 tablet (25 mg total) by mouth 2 (two) times daily.    . polyethylene glycol (MIRALAX / GLYCOLAX) packet Take 17 g by mouth daily. 14 each 0  . Saccharomyces boulardii (FLORASTOR PO) Take 250 mg by mouth 2 (two) times daily.    . tamsulosin (FLOMAX) 0.4 MG CAPS capsule Take 2 capsules (0.8 mg total) by mouth daily. 90 capsule 0  . vitamin B-12 (CYANOCOBALAMIN) 500 MCG tablet Take 1,000 mcg by mouth daily.    . Vitamin D, Ergocalciferol, (DRISDOL) 50000 units CAPS capsule Take 50,000 Units by mouth every 7 (seven) days.     No current facility-administered medications for this visit.     REVIEW OF SYSTEMS:    10 Point review of Systems was done is negative except as noted above.  PHYSICAL EXAMINATION: ECOG PERFORMANCE STATUS: 2 - Symptomatic,  <50% confined to bed  . Vitals:   04/14/18 1051  BP: (!) 169/93  Pulse: 74  Resp: 18  Temp: 98.5 F (36.9 C)  SpO2: 100%   Filed Weights   04/14/18 1051  Weight: 184 lb 4.8 oz (83.6 kg)   .Body mass index is 26.44 kg/m.  GENERAL:alert, in no acute distress and comfortable SKIN: no acute rashes, no significant lesions EYES: conjunctiva are pink and non-injected, sclera anicteric OROPHARYNX: MMM, no exudates, no oropharyngeal erythema or ulceration NECK: supple, no JVD LYMPH:  no palpable lymphadenopathy in the cervical, axillary or inguinal regions LUNGS: clear to auscultation b/l with normal respiratory effort HEART: regular rate & rhythm ABDOMEN:  normoactive bowel sounds , non tender, not distended. Extremity: no pedal edema PSYCH: alert & oriented x 3 with fluent speech NEURO: no focal motor/sensory deficits  LABORATORY DATA:  I have reviewed the data as listed  . CBC Latest Ref Rng & Units 04/17/2018 04/14/2018 03/25/2018  WBC 4.0 - 10.5 K/uL 4.6 5.3 6.6  Hemoglobin 13.0 - 17.0 g/dL 10.1(L) 10.6(L) 11.3(L)  Hematocrit 39.0 - 52.0 % 32.4(L) 32.4(L) 33.6(L)  Platelets 150 - 400 K/uL 189 191 193.0    . CMP Latest Ref Rng & Units 04/14/2018 04/14/2018 03/25/2018  Glucose 70 - 99 mg/dL 88 - 100(H)  BUN 8 - 23 mg/dL 17 - 25(H)  Creatinine 0.61 - 1.24 mg/dL 1.59(H) - 1.48  Sodium 135 - 145 mmol/L 140 - 135  Potassium 3.5 - 5.1 mmol/L 3.8 - 3.7  Chloride 98 - 111 mmol/L 101 - 94(L)  CO2 22 - 32 mmol/L 28 - 33(H)  Calcium 8.6 - 10.2 mg/dL 11.1(H) 10.9(H) 13.8(HH)  Total Protein 6.5 - 8.1 g/dL 9.8(H) - 9.4(H)  Total Bilirubin 0.3 - 1.2 mg/dL 0.5 - 0.5  Alkaline Phos 38 - 126 U/L 145(H) - 90  AST 15 - 41 U/L 19 - 21  ALT 0 - 44 U/L 11 - 10     RADIOGRAPHIC STUDIES: I have personally reviewed the radiological images as listed and agreed with the findings in the report. Dg Shoulder Right  Result Date: 03/24/2018 CLINICAL DATA:  Shoulder pain following injury 2  weeks ago, initial encounter EXAM: RIGHT SHOULDER - 2+ VIEW COMPARISON:  09/05/2017. FINDINGS: Distal right clavicular fracture is noted with some downward displacement of the distal fracture fragment. No acromioclavicular separation is seen. There is a lytic lesion involving the right fifth rib posterolaterally as well as a fracture of the right sixth rib posteriorly. Some expansile lesions are seen laterally in the right sixth rib as well. IMPRESSION: Distal right clavicular fracture with some irregular margins likely related to a pathologic fracture. There are abnormalities in the right ribcage as described above also suggestive of metastatic disease. These were not present on a chest x-ray from 09/05/2017. Further nonemergent workup is recommended. Electronically Signed   By: Inez Catalina M.D.   On: 03/24/2018 11:07   Ct Chest W Contrast  Result Date: 03/31/2018 CLINICAL DATA:  82 year old male with right clavicle fracture getting out of bed last week. X-ray showed lytic lesions. Subsequent encounter. EXAM: CT CHEST, ABDOMEN, AND PELVIS WITH CONTRAST TECHNIQUE: Multidetector CT imaging of the chest, abdomen and pelvis was performed following the standard protocol during bolus administration of intravenous contrast. CONTRAST:  175m ISOVUE-300 IOPAMIDOL (ISOVUE-300) INJECTION 61% COMPARISON:  03/24/2018 clavicle films. 08/10/2014 CT abdomen and pelvis. FINDINGS: CT CHEST FINDINGS Cardiovascular: No central pulmonary embolus. No  aortic dissection. Ascending thoracic aorta measures 3.9 cm. Heart size within normal limits. Sequential pacemaker in place. Mediastinum/Nodes: Matted AP window lymph nodes which short axis dimension 1.4 cm. No hilar or axillary adenopathy Lungs/Pleura: 6 mm right apical pleural based nodule. No primary lung malignancy detected. Trachea and mainstem bronchi are patent. Musculoskeletal: Lytic lesions throughout majority of visualized osseous structures including all thoracic vertebra  and multiple ribs bilaterally. Epidural extension of tumor T2 and T3 level. Multiple pathologic rib fractures with bony expansion/sub pleural extension of tumor at several levels. Lytic lesions of the scapula. Pathologic fracture right clavicle incompletely assessed. Other: Gynecomastia. CT ABDOMEN PELVIS FINDINGS Hepatobiliary: No focal hepatic lesion. Previously noted fatty infiltration less apparent. Small gallstones. Pancreas: 5 mm low-density lesion pancreatic body (series 5, image 10) unchanged. Spleen: No splenic lesion. Adrenals/Urinary Tract: No obstructing stone or hydronephrosis. Right parapelvic 2.2 cm cyst without significant change. Scattered low-density lesions within both kidneys too small to adequately characterize. Some were present previously. Statistically these are likely cysts. Hyperplasia adrenal glands without change. Circumferential urinary bladder wall thickening greater anterior dome region. Right lateral bladder diverticulum. Stomach/Bowel: Circumferential narrowing sigmoid colon. Question possibility of underlying mass (series 2, image 97). Alternatively this could represent muscular hypertrophy/peristalsis. Gastric antrum circumferential narrowing. Cannot exclude mass although this may be related to peristalsis. Vascular/Lymphatic: Atherosclerotic changes aorta and aortic branch vessels. No abdominal aortic aneurysm or large vessel occlusion. Scattered normal size lymph nodes. Reproductive: Prostate gland causes minimal impression upon the bladder base. Other: No free air or bowel containing hernia. Musculoskeletal: Lytic lesions throughout all lumbar vertebra. Epidural extension L2 level. Right L3-4 foraminal extension of tumor. Lytic lesions throughout hips bilaterally, pelvis bilaterally and sacrum bilaterally. IMPRESSION: 1. Lytic lesion throughout all visualized vertebra from the lower cervical spine through the sacrum. Epidural extension of tumor T2, T3 and L2 level. Right L3-4  foraminal extension tumor. 2. Lytic lesions involving majority of ribs. Multiple pathologic rib fractures with bony expansion/sub pleural extension of tumor at several levels. 3. Lytic lesions of the scapula, hips bilaterally and pelvis bilaterally. With further progression of tumor, patient may be at risk for pathologic fractures. Pathologic fracture right clavicle incompletely assessed. 4. Circumferential narrowing sigmoid colon. Question possibility of underlying mass (series 2, image 97). Alternatively this could represent muscular hypertrophy/peristalsis. 5. Gastric antrum circumferential narrowing. Cannot exclude mass although this may be related to peristalsis. 6. No primary lung malignancy noted. 7. Matted aortic pulmonary window lymph nodes. 8. Circumferential urinary bladder wall thickening greater anterior dome region. Right lateral bladder diverticulum. 9. 5 mm low-density lesion pancreatic body (series 5, image 10) unchanged. This is felt unlikely to be a primary malignancy. 10. Right parapelvic 2.2 cm cyst without significant change. Scattered low-density renal lesions bilaterally too small to adequately characterize. Some were present previously. Statistically these are likely cysts. 11. Stable adrenal gland hyperplasia. 12. Gynecomastia. 13. Prostate gland causes slight impression upon the bladder base. 14.  Aortic Atherosclerosis (ICD10-I70.0). 15. Sequential pacemaker in place. 16. Gallstones. These results will be called to the ordering clinician or representative by the Radiologist Assistant, and communication documented in the PACS or zVision Dashboard. Electronically Signed   By: Genia Del M.D.   On: 03/31/2018 13:31   Ct Abdomen Pelvis W Contrast  Result Date: 03/31/2018 CLINICAL DATA:  82 year old male with right clavicle fracture getting out of bed last week. X-ray showed lytic lesions. Subsequent encounter. EXAM: CT CHEST, ABDOMEN, AND PELVIS WITH CONTRAST TECHNIQUE: Multidetector  CT imaging of the chest,  abdomen and pelvis was performed following the standard protocol during bolus administration of intravenous contrast. CONTRAST:  156m ISOVUE-300 IOPAMIDOL (ISOVUE-300) INJECTION 61% COMPARISON:  03/24/2018 clavicle films. 08/10/2014 CT abdomen and pelvis. FINDINGS: CT CHEST FINDINGS Cardiovascular: No central pulmonary embolus. No aortic dissection. Ascending thoracic aorta measures 3.9 cm. Heart size within normal limits. Sequential pacemaker in place. Mediastinum/Nodes: Matted AP window lymph nodes which short axis dimension 1.4 cm. No hilar or axillary adenopathy Lungs/Pleura: 6 mm right apical pleural based nodule. No primary lung malignancy detected. Trachea and mainstem bronchi are patent. Musculoskeletal: Lytic lesions throughout majority of visualized osseous structures including all thoracic vertebra and multiple ribs bilaterally. Epidural extension of tumor T2 and T3 level. Multiple pathologic rib fractures with bony expansion/sub pleural extension of tumor at several levels. Lytic lesions of the scapula. Pathologic fracture right clavicle incompletely assessed. Other: Gynecomastia. CT ABDOMEN PELVIS FINDINGS Hepatobiliary: No focal hepatic lesion. Previously noted fatty infiltration less apparent. Small gallstones. Pancreas: 5 mm low-density lesion pancreatic body (series 5, image 10) unchanged. Spleen: No splenic lesion. Adrenals/Urinary Tract: No obstructing stone or hydronephrosis. Right parapelvic 2.2 cm cyst without significant change. Scattered low-density lesions within both kidneys too small to adequately characterize. Some were present previously. Statistically these are likely cysts. Hyperplasia adrenal glands without change. Circumferential urinary bladder wall thickening greater anterior dome region. Right lateral bladder diverticulum. Stomach/Bowel: Circumferential narrowing sigmoid colon. Question possibility of underlying mass (series 2, image 97). Alternatively  this could represent muscular hypertrophy/peristalsis. Gastric antrum circumferential narrowing. Cannot exclude mass although this may be related to peristalsis. Vascular/Lymphatic: Atherosclerotic changes aorta and aortic branch vessels. No abdominal aortic aneurysm or large vessel occlusion. Scattered normal size lymph nodes. Reproductive: Prostate gland causes minimal impression upon the bladder base. Other: No free air or bowel containing hernia. Musculoskeletal: Lytic lesions throughout all lumbar vertebra. Epidural extension L2 level. Right L3-4 foraminal extension of tumor. Lytic lesions throughout hips bilaterally, pelvis bilaterally and sacrum bilaterally. IMPRESSION: 1. Lytic lesion throughout all visualized vertebra from the lower cervical spine through the sacrum. Epidural extension of tumor T2, T3 and L2 level. Right L3-4 foraminal extension tumor. 2. Lytic lesions involving majority of ribs. Multiple pathologic rib fractures with bony expansion/sub pleural extension of tumor at several levels. 3. Lytic lesions of the scapula, hips bilaterally and pelvis bilaterally. With further progression of tumor, patient may be at risk for pathologic fractures. Pathologic fracture right clavicle incompletely assessed. 4. Circumferential narrowing sigmoid colon. Question possibility of underlying mass (series 2, image 97). Alternatively this could represent muscular hypertrophy/peristalsis. 5. Gastric antrum circumferential narrowing. Cannot exclude mass although this may be related to peristalsis. 6. No primary lung malignancy noted. 7. Matted aortic pulmonary window lymph nodes. 8. Circumferential urinary bladder wall thickening greater anterior dome region. Right lateral bladder diverticulum. 9. 5 mm low-density lesion pancreatic body (series 5, image 10) unchanged. This is felt unlikely to be a primary malignancy. 10. Right parapelvic 2.2 cm cyst without significant change. Scattered low-density renal lesions  bilaterally too small to adequately characterize. Some were present previously. Statistically these are likely cysts. 11. Stable adrenal gland hyperplasia. 12. Gynecomastia. 13. Prostate gland causes slight impression upon the bladder base. 14.  Aortic Atherosclerosis (ICD10-I70.0). 15. Sequential pacemaker in place. 16. Gallstones. These results will be called to the ordering clinician or representative by the Radiologist Assistant, and communication documented in the PACS or zVision Dashboard. Electronically Signed   By: SGenia DelM.D.   On: 03/31/2018 13:31    ASSESSMENT &  PLAN:  82 y.o. male with  1. Lytic bone lesions - concerning for myeloma vs metastatic carcinoma with unknown primary 2. Pathologic right clavicle fracture 3. Hypercalcemia 03/25/18 CMP revealed Calcium at 13.8-- now down to 11.1 Will begin Xgeva injections, and strongly recommended keeping very well hydrated   PLAN:  -Discussed patient's most recent labs from 04/14/18, hypercalcemic with Calcium at 13.8, renal function up form baseline with Creatinine at 1.48, HGB at 11.3. PSA was normal.  -Discussed the 03/31/18 CT C/A/P which revealed 1. Lytic lesion throughout all visualized vertebra from the lower cervical spine through the sacrum. Epidural extension of tumor T2, T3 and L2 level. Right L3-4 foraminal extension tumor. 2. Lytic lesions involving majority of ribs. Multiple pathologic rib fractures with bony expansion/sub pleural extension of tumor at several levels. 3. Lytic lesions of the scapula, hips bilaterally and pelvis bilaterally. With further progression of tumor, patient may be at risk for pathologic fractures. Pathologic fracture right clavicle incompletely assessed. 4. Circumferential narrowing sigmoid colon. Question possibility of underlying mass (series 2, image 97). Alternatively this could represent muscular hypertrophy/peristalsis. 5. Gastric antrum circumferential narrowing. Cannot exclude mass although  this may be related to peristalsis. 6. No primary lung malignancy noted. 7. Matted aortic pulmonary window lymph nodes. 8. Circumferential urinary bladder wall thickening greater anterior dome region. Right lateral bladder diverticulum. 9. 5 mm low-density lesion pancreatic body (series 5, image 10) unchanged. This is felt unlikely to be a primary malignancy. 10. Right parapelvic 2.2 cm cyst without significant change. Scattered low-density renal lesions bilaterally too small to adequately characterize. Some were present previously. Statistically these are likely cysts. 11. Stable adrenal gland hyperplasia. 12. Gynecomastia. 13. Prostate gland causes slight impression upon the bladder base. 14.  Aortic Atherosclerosis. 15. Sequential pacemaker in place. 16. Gallstones.  -Discussed that all of the work up so far is concerning for Multiple Myeloma, and would require a BM Bx, imaging, and blood tests to further evaluate, all of which the pt prefers -Encouraged the patient to stay very well hydrated given hypercalcemia   -Will begin Xgeva injections. Pt denies any recent dental procedures, dental issues or concerns. -Advised that the pt do his best to minimize pushing, pulling, or lifting to reduce risk of further pathologic bone fractures  -Continue Tramadol for pain control  -Will refer pt to IR for BM Bx -Will order PET/CT  -Will order 24 hour urine study -Will order labs today -Will see the pt back in 10 days    Labs today PET/CT ASAP in 5-7 days Xgeva to start ASAP in 2-3 days CT bone marrow biopsy in 2 days RTC with Dr Irene Limbo in 10 days    All of the patients questions were answered with apparent satisfaction. The patient knows to call the clinic with any problems, questions or concerns.  The total time spent in the appt was 60 minutes and more than 50% was on counseling and direct patient cares.    Sullivan Lone MD MS AAHIVMS [facility] Physician [facility]  (Office):       [phone] (Work cell):  [phone] (Fax):           [phone]  04/14/2018 11:42 AM  I, Baldwin Jamaica, am acting as a scribe for Dr. Sullivan Lone.   .I have reviewed the above documentation for accuracy and completeness, and I agree with the above. Brunetta Genera MD

## 2018-04-14 ENCOUNTER — Inpatient Hospital Stay: Payer: Medicare Other

## 2018-04-14 ENCOUNTER — Encounter: Payer: Self-pay | Admitting: Hematology

## 2018-04-14 ENCOUNTER — Telehealth: Payer: Self-pay

## 2018-04-14 ENCOUNTER — Inpatient Hospital Stay: Payer: Medicare Other | Attending: Hematology | Admitting: Hematology

## 2018-04-14 VITALS — BP 169/93 | HR 74 | Temp 98.5°F | Resp 18 | Ht 70.0 in | Wt 184.3 lb

## 2018-04-14 DIAGNOSIS — C7951 Secondary malignant neoplasm of bone: Secondary | ICD-10-CM | POA: Diagnosis not present

## 2018-04-14 DIAGNOSIS — Z803 Family history of malignant neoplasm of breast: Secondary | ICD-10-CM | POA: Diagnosis not present

## 2018-04-14 DIAGNOSIS — Z950 Presence of cardiac pacemaker: Secondary | ICD-10-CM | POA: Diagnosis not present

## 2018-04-14 DIAGNOSIS — R0789 Other chest pain: Secondary | ICD-10-CM | POA: Insufficient documentation

## 2018-04-14 DIAGNOSIS — C801 Malignant (primary) neoplasm, unspecified: Secondary | ICD-10-CM | POA: Diagnosis not present

## 2018-04-14 DIAGNOSIS — Z5112 Encounter for antineoplastic immunotherapy: Secondary | ICD-10-CM | POA: Insufficient documentation

## 2018-04-14 DIAGNOSIS — I2510 Atherosclerotic heart disease of native coronary artery without angina pectoris: Secondary | ICD-10-CM | POA: Insufficient documentation

## 2018-04-14 DIAGNOSIS — Z7982 Long term (current) use of aspirin: Secondary | ICD-10-CM | POA: Diagnosis not present

## 2018-04-14 DIAGNOSIS — X58XXXS Exposure to other specified factors, sequela: Secondary | ICD-10-CM | POA: Insufficient documentation

## 2018-04-14 DIAGNOSIS — Z87891 Personal history of nicotine dependence: Secondary | ICD-10-CM | POA: Diagnosis not present

## 2018-04-14 DIAGNOSIS — C9000 Multiple myeloma not having achieved remission: Secondary | ICD-10-CM | POA: Insufficient documentation

## 2018-04-14 DIAGNOSIS — M545 Low back pain: Secondary | ICD-10-CM | POA: Diagnosis not present

## 2018-04-14 DIAGNOSIS — I700 Atherosclerosis of aorta: Secondary | ICD-10-CM | POA: Insufficient documentation

## 2018-04-14 DIAGNOSIS — Z8041 Family history of malignant neoplasm of ovary: Secondary | ICD-10-CM | POA: Diagnosis not present

## 2018-04-14 DIAGNOSIS — I5032 Chronic diastolic (congestive) heart failure: Secondary | ICD-10-CM | POA: Diagnosis not present

## 2018-04-14 DIAGNOSIS — I110 Hypertensive heart disease with heart failure: Secondary | ICD-10-CM | POA: Insufficient documentation

## 2018-04-14 DIAGNOSIS — R634 Abnormal weight loss: Secondary | ICD-10-CM | POA: Insufficient documentation

## 2018-04-14 DIAGNOSIS — G6181 Chronic inflammatory demyelinating polyneuritis: Secondary | ICD-10-CM | POA: Insufficient documentation

## 2018-04-14 DIAGNOSIS — R5383 Other fatigue: Secondary | ICD-10-CM | POA: Diagnosis not present

## 2018-04-14 DIAGNOSIS — Z79899 Other long term (current) drug therapy: Secondary | ICD-10-CM | POA: Insufficient documentation

## 2018-04-14 DIAGNOSIS — S42031S Displaced fracture of lateral end of right clavicle, sequela: Secondary | ICD-10-CM | POA: Diagnosis not present

## 2018-04-14 HISTORY — DX: Secondary malignant neoplasm of bone: C79.51

## 2018-04-14 LAB — CMP (CANCER CENTER ONLY)
ALT: 11 U/L (ref 0–44)
AST: 19 U/L (ref 15–41)
Albumin: 3 g/dL — ABNORMAL LOW (ref 3.5–5.0)
Alkaline Phosphatase: 145 U/L — ABNORMAL HIGH (ref 38–126)
Anion gap: 11 (ref 5–15)
BILIRUBIN TOTAL: 0.5 mg/dL (ref 0.3–1.2)
BUN: 17 mg/dL (ref 8–23)
CO2: 28 mmol/L (ref 22–32)
CREATININE: 1.59 mg/dL — AB (ref 0.61–1.24)
Calcium: 11.1 mg/dL — ABNORMAL HIGH (ref 8.9–10.3)
Chloride: 101 mmol/L (ref 98–111)
GFR, EST NON AFRICAN AMERICAN: 40 mL/min — AB (ref >60)
GFR, Est AFR Am: 47 mL/min — ABNORMAL LOW (ref >60)
Glucose, Bld: 88 mg/dL (ref 70–99)
POTASSIUM: 3.8 mmol/L (ref 3.5–5.1)
Sodium: 140 mmol/L (ref 135–145)
TOTAL PROTEIN: 9.8 g/dL — AB (ref 6.5–8.1)

## 2018-04-14 LAB — SAMPLE TO BLOOD BANK

## 2018-04-14 LAB — CBC WITH DIFFERENTIAL/PLATELET
Abs Immature Granulocytes: 0.02 10*3/uL (ref 0.00–0.07)
Basophils Absolute: 0 10*3/uL (ref 0.0–0.1)
Basophils Relative: 1 %
EOS ABS: 0.3 10*3/uL (ref 0.0–0.5)
EOS PCT: 6 %
HEMATOCRIT: 32.4 % — AB (ref 39.0–52.0)
Hemoglobin: 10.6 g/dL — ABNORMAL LOW (ref 13.0–17.0)
IMMATURE GRANULOCYTES: 0 %
LYMPHS ABS: 1.8 10*3/uL (ref 0.7–4.0)
Lymphocytes Relative: 35 %
MCH: 27 pg (ref 26.0–34.0)
MCHC: 32.7 g/dL (ref 30.0–36.0)
MCV: 82.7 fL (ref 80.0–100.0)
MONO ABS: 0.4 10*3/uL (ref 0.1–1.0)
MONOS PCT: 7 %
Neutro Abs: 2.7 10*3/uL (ref 1.7–7.7)
Neutrophils Relative %: 51 %
Platelets: 191 10*3/uL (ref 150–400)
RBC: 3.92 MIL/uL — ABNORMAL LOW (ref 4.22–5.81)
RDW: 14.7 % (ref 11.5–15.5)
WBC: 5.3 10*3/uL (ref 4.0–10.5)
nRBC: 0 % (ref 0.0–0.2)

## 2018-04-14 LAB — LACTATE DEHYDROGENASE: LDH: 204 U/L — AB (ref 98–192)

## 2018-04-14 LAB — CEA (IN HOUSE-CHCC): CEA (CHCC-In House): 1.6 ng/mL (ref 0.00–5.00)

## 2018-04-14 LAB — SEDIMENTATION RATE: Sed Rate: 131 mm/hr — ABNORMAL HIGH (ref 0–16)

## 2018-04-14 NOTE — Telephone Encounter (Signed)
Ok for refills

## 2018-04-14 NOTE — Telephone Encounter (Signed)
Copied from [place] [phone]. Topic: General - Other >> Apr 14, 2018  1:02 PM Oneta Rack wrote: Osvaldo Human name: Marcille Blanco  Relation to pt: [employer] back number: [phone] fax # [phone]   Reason for call:  Daughter states PCP advised if Tramadol 50mg  1 tablet every 8hr as needed short supply worked for the patient to call back and she would give him additional refills. [facility] request refill and new Rx please fax to [phone]

## 2018-04-14 NOTE — Telephone Encounter (Signed)
Printed avs and calender of upcoming appointment. Per 12/3 los 

## 2018-04-15 LAB — PROSTATE-SPECIFIC AG, SERUM (LABCORP): PROSTATE SPECIFIC AG, SERUM: 1 ng/mL (ref 0.0–4.0)

## 2018-04-15 LAB — PTH, INTACT AND CALCIUM
Calcium, Total (PTH): 10.9 mg/dL — ABNORMAL HIGH (ref 8.6–10.2)
PTH: 8 pg/mL — AB (ref 15–65)

## 2018-04-15 LAB — KAPPA/LAMBDA LIGHT CHAINS
Kappa free light chain: 48 mg/L — ABNORMAL HIGH (ref 3.3–19.4)
Kappa, lambda light chain ratio: 2.55 — ABNORMAL HIGH (ref 0.26–1.65)
Lambda free light chains: 18.8 mg/L (ref 5.7–26.3)

## 2018-04-15 LAB — CALCIUM, IONIZED: CALCIUM, IONIZED, SERUM: 5.6 mg/dL (ref 4.5–5.6)

## 2018-04-15 LAB — VITAMIN D 25 HYDROXY (VIT D DEFICIENCY, FRACTURES): VIT D 25 HYDROXY: 138.8 ng/mL — AB (ref 30.0–100.0)

## 2018-04-15 NOTE — Telephone Encounter (Signed)
Ok to send in rx for Tramadol 50 mg. Take 1 tab q 8 hrs prn for pain.  Dispense 90.  Refills 2.

## 2018-04-15 NOTE — Telephone Encounter (Signed)
Rx was call in to pharmacy per Dr Volanda Napoleon approval

## 2018-04-16 ENCOUNTER — Telehealth: Payer: Self-pay | Admitting: Family Medicine

## 2018-04-16 ENCOUNTER — Other Ambulatory Visit: Payer: Self-pay

## 2018-04-16 ENCOUNTER — Inpatient Hospital Stay: Payer: Medicare Other

## 2018-04-16 ENCOUNTER — Other Ambulatory Visit: Payer: Self-pay | Admitting: Student

## 2018-04-16 DIAGNOSIS — C801 Malignant (primary) neoplasm, unspecified: Secondary | ICD-10-CM | POA: Diagnosis not present

## 2018-04-16 DIAGNOSIS — C7951 Secondary malignant neoplasm of bone: Secondary | ICD-10-CM | POA: Diagnosis not present

## 2018-04-16 DIAGNOSIS — S42031S Displaced fracture of lateral end of right clavicle, sequela: Secondary | ICD-10-CM | POA: Diagnosis not present

## 2018-04-16 DIAGNOSIS — C9000 Multiple myeloma not having achieved remission: Secondary | ICD-10-CM | POA: Diagnosis not present

## 2018-04-16 DIAGNOSIS — Z5112 Encounter for antineoplastic immunotherapy: Secondary | ICD-10-CM | POA: Diagnosis not present

## 2018-04-16 LAB — MULTIPLE MYELOMA PANEL, SERUM
ALBUMIN SERPL ELPH-MCNC: 3.1 g/dL (ref 2.9–4.4)
ALBUMIN/GLOB SERPL: 0.6 — AB (ref 0.7–1.7)
ALPHA2 GLOB SERPL ELPH-MCNC: 0.9 g/dL (ref 0.4–1.0)
Alpha 1: 0.3 g/dL (ref 0.0–0.4)
B-Globulin SerPl Elph-Mcnc: 4.3 g/dL — ABNORMAL HIGH (ref 0.7–1.3)
Gamma Glob SerPl Elph-Mcnc: 0.6 g/dL (ref 0.4–1.8)
Globulin, Total: 6.2 g/dL — ABNORMAL HIGH (ref 2.2–3.9)
IGA: 3485 mg/dL — AB (ref 61–437)
IGG (IMMUNOGLOBIN G), SERUM: 970 mg/dL (ref 700–1600)
IGM (IMMUNOGLOBULIN M), SRM: 73 mg/dL (ref 15–143)
M Protein SerPl Elph-Mcnc: 3.4 g/dL — ABNORMAL HIGH
Total Protein ELP: 9.3 g/dL — ABNORMAL HIGH (ref 6.0–8.5)

## 2018-04-16 MED ORDER — DENOSUMAB 120 MG/1.7ML ~~LOC~~ SOLN
120.0000 mg | Freq: Once | SUBCUTANEOUS | Status: AC
  Administered 2018-04-16: 120 mg via SUBCUTANEOUS

## 2018-04-16 MED ORDER — TRAMADOL HCL 50 MG PO TABS: ORAL_TABLET | ORAL | 2 refills | Status: DC

## 2018-04-16 NOTE — Telephone Encounter (Signed)
Spoke with Cyril Mourning aware that Rx script has been faxed to [place] facility

## 2018-04-16 NOTE — Telephone Encounter (Signed)
Copied from [place] [phone]. Topic: Quick Communication - See Telephone Encounter >> Apr 16, 2018  4:03 PM Blase Mess A wrote: CRM for notification. See Telephone encounter for: 04/16/18.  Patient daughter is calling regarding traMADol (ULTRAM) 50 MG tablet [[id number]] The patient is in a facility the daughter is requesting the script to be faxed at this time. [phone]- Attention Kristen.  Patient is out of medication.

## 2018-04-17 ENCOUNTER — Ambulatory Visit (HOSPITAL_COMMUNITY)
Admission: RE | Admit: 2018-04-17 | Discharge: 2018-04-17 | Disposition: A | Discharge status: Home / Self Care | Payer: Medicare Other | Source: Ambulatory Visit | Attending: Hematology | Admitting: Hematology

## 2018-04-17 ENCOUNTER — Encounter (HOSPITAL_COMMUNITY): Payer: Self-pay

## 2018-04-17 DIAGNOSIS — Z8041 Family history of malignant neoplasm of ovary: Secondary | ICD-10-CM | POA: Insufficient documentation

## 2018-04-17 DIAGNOSIS — Z888 Allergy status to other drugs, medicaments and biological substances status: Secondary | ICD-10-CM | POA: Insufficient documentation

## 2018-04-17 DIAGNOSIS — Z8249 Family history of ischemic heart disease and other diseases of the circulatory system: Secondary | ICD-10-CM | POA: Insufficient documentation

## 2018-04-17 DIAGNOSIS — Z803 Family history of malignant neoplasm of breast: Secondary | ICD-10-CM | POA: Diagnosis not present

## 2018-04-17 DIAGNOSIS — Z833 Family history of diabetes mellitus: Secondary | ICD-10-CM | POA: Diagnosis not present

## 2018-04-17 DIAGNOSIS — Z950 Presence of cardiac pacemaker: Secondary | ICD-10-CM | POA: Insufficient documentation

## 2018-04-17 DIAGNOSIS — D649 Anemia, unspecified: Secondary | ICD-10-CM | POA: Diagnosis not present

## 2018-04-17 DIAGNOSIS — Z79899 Other long term (current) drug therapy: Secondary | ICD-10-CM | POA: Insufficient documentation

## 2018-04-17 DIAGNOSIS — Z87891 Personal history of nicotine dependence: Secondary | ICD-10-CM | POA: Diagnosis not present

## 2018-04-17 DIAGNOSIS — Z820 Family history of epilepsy and other diseases of the nervous system: Secondary | ICD-10-CM | POA: Diagnosis not present

## 2018-04-17 DIAGNOSIS — D4989 Neoplasm of unspecified behavior of other specified sites: Secondary | ICD-10-CM | POA: Diagnosis not present

## 2018-04-17 DIAGNOSIS — C9000 Multiple myeloma not having achieved remission: Secondary | ICD-10-CM | POA: Diagnosis not present

## 2018-04-17 DIAGNOSIS — C9030 Solitary plasmacytoma not having achieved remission: Secondary | ICD-10-CM | POA: Diagnosis not present

## 2018-04-17 DIAGNOSIS — G6181 Chronic inflammatory demyelinating polyneuritis: Secondary | ICD-10-CM | POA: Diagnosis not present

## 2018-04-17 DIAGNOSIS — Z87442 Personal history of urinary calculi: Secondary | ICD-10-CM | POA: Diagnosis not present

## 2018-04-17 DIAGNOSIS — N400 Enlarged prostate without lower urinary tract symptoms: Secondary | ICD-10-CM | POA: Diagnosis not present

## 2018-04-17 DIAGNOSIS — I5032 Chronic diastolic (congestive) heart failure: Secondary | ICD-10-CM | POA: Insufficient documentation

## 2018-04-17 DIAGNOSIS — M899 Disorder of bone, unspecified: Secondary | ICD-10-CM | POA: Insufficient documentation

## 2018-04-17 DIAGNOSIS — I110 Hypertensive heart disease with heart failure: Secondary | ICD-10-CM | POA: Insufficient documentation

## 2018-04-17 DIAGNOSIS — Z7982 Long term (current) use of aspirin: Secondary | ICD-10-CM | POA: Insufficient documentation

## 2018-04-17 DIAGNOSIS — C801 Malignant (primary) neoplasm, unspecified: Secondary | ICD-10-CM

## 2018-04-17 DIAGNOSIS — Z841 Family history of disorders of kidney and ureter: Secondary | ICD-10-CM | POA: Diagnosis not present

## 2018-04-17 DIAGNOSIS — C7951 Secondary malignant neoplasm of bone: Secondary | ICD-10-CM

## 2018-04-17 LAB — CBC WITH DIFFERENTIAL/PLATELET
Abs Immature Granulocytes: 0.01 10*3/uL (ref 0.00–0.07)
Basophils Absolute: 0 10*3/uL (ref 0.0–0.1)
Basophils Relative: 1 %
Eosinophils Absolute: 0.3 10*3/uL (ref 0.0–0.5)
Eosinophils Relative: 6 %
HCT: 32.4 % — ABNORMAL LOW (ref 39.0–52.0)
Hemoglobin: 10.1 g/dL — ABNORMAL LOW (ref 13.0–17.0)
Immature Granulocytes: 0 %
LYMPHS PCT: 43 %
Lymphs Abs: 1.9 10*3/uL (ref 0.7–4.0)
MCH: 27.1 pg (ref 26.0–34.0)
MCHC: 31.2 g/dL (ref 30.0–36.0)
MCV: 86.9 fL (ref 80.0–100.0)
Monocytes Absolute: 0.4 10*3/uL (ref 0.1–1.0)
Monocytes Relative: 8 %
Neutro Abs: 1.9 10*3/uL (ref 1.7–7.7)
Neutrophils Relative %: 42 %
Platelets: 189 10*3/uL (ref 150–400)
RBC: 3.73 MIL/uL — AB (ref 4.22–5.81)
RDW: 14.8 % (ref 11.5–15.5)
WBC: 4.6 10*3/uL (ref 4.0–10.5)
nRBC: 0 % (ref 0.0–0.2)

## 2018-04-17 LAB — PROTIME-INR
INR: 1.12
PROTHROMBIN TIME: 14.3 s (ref 11.4–15.2)

## 2018-04-17 LAB — UPEP/TP, 24-HR URINE
Albumin, U: 75.9 %
Alpha 1, Urine: 5.7 %
Alpha 2, Urine: 3.4 %
Beta, Urine: 7.4 %
Gamma Globulin, Urine: 7.6 %
Total Protein, Urine-Ur/day: 726 mg/24 hr — ABNORMAL HIGH (ref 30–150)
Total Protein, Urine: 24.2 mg/dL
Total Volume: 3000

## 2018-04-17 LAB — APTT: APTT: 31 s (ref 24–36)

## 2018-04-17 MED ORDER — FENTANYL CITRATE (PF) 100 MCG/2ML IJ SOLN
INTRAMUSCULAR | Status: AC
  Filled 2018-04-17: qty 6

## 2018-04-17 MED ORDER — MIDAZOLAM HCL 2 MG/2ML IJ SOLN
INTRAMUSCULAR | Status: AC
  Filled 2018-04-17: qty 6

## 2018-04-17 MED ORDER — FENTANYL CITRATE (PF) 100 MCG/2ML IJ SOLN
INTRAMUSCULAR | Status: AC | PRN
  Administered 2018-04-17 (×2): 50 ug via INTRAVENOUS

## 2018-04-17 MED ORDER — SODIUM CHLORIDE 0.9 % IV SOLN
INTRAVENOUS | Status: DC
  Administered 2018-04-17: 09:00:00 via INTRAVENOUS

## 2018-04-17 MED ORDER — FLUMAZENIL 0.5 MG/5ML IV SOLN
INTRAVENOUS | Status: AC
  Filled 2018-04-17: qty 5

## 2018-04-17 MED ORDER — NALOXONE HCL 0.4 MG/ML IJ SOLN
INTRAMUSCULAR | Status: AC
  Filled 2018-04-17: qty 1

## 2018-04-17 MED ORDER — MIDAZOLAM HCL 2 MG/2ML IJ SOLN
INTRAMUSCULAR | Status: AC | PRN
  Administered 2018-04-17 (×2): 1 mg via INTRAVENOUS

## 2018-04-17 NOTE — Discharge Instructions (Signed)
Bone Marrow Aspiration and Bone Marrow Biopsy, Adult, Care After °This sheet gives you information about how to care for yourself after your procedure. Your health care provider may also give you more specific instructions. If you have problems or questions, contact your health care provider. °What can I expect after the procedure? °After the procedure, it is common to have: °· Mild pain and tenderness. °· Swelling. °· Bruising. ° °Follow these instructions at home: °· Take over-the-counter or prescription medicines only as told by your health care provider. °· Do not take baths, swim, or use a hot tub until your health care provider approves. Ask if you can take a shower or have a sponge bath. °· Follow instructions from your health care provider about how to take care of the puncture site. Make sure you: °? Wash your hands with soap and water before you change your bandage (dressing). If soap and water are not available, use hand sanitizer. °? Change your dressing as told by your health care provider. °· Check your puncture site every day for signs of infection. Check for: °? More redness, swelling, or pain. °? More fluid or blood. °? Warmth. °? Pus or a bad smell. °· Return to your normal activities as told by your health care provider. Ask your health care provider what activities are safe for you. °· Do not drive for 24 hours if you were given a medicine to help you relax (sedative). °· Keep all follow-up visits as told by your health care provider. This is important. °Contact a health care provider if: °· You have more redness, swelling, or pain around the puncture site. °· You have more fluid or blood coming from the puncture site. °· Your puncture site feels warm to the touch. °· You have pus or a bad smell coming from the puncture site. °· You have a fever. °· Your pain is not controlled with medicine. °This information is not intended to replace advice given to you by your health care provider. Make sure  you discuss any questions you have with your health care provider. °Document Released: 11/16/2004 Document Revised: 11/17/2015 Document Reviewed: 10/11/2015 °Elsevier Interactive Patient Education © 2018 [employer] °Moderate Conscious Sedation, Adult, Care After °These instructions provide you with information about caring for yourself after your procedure. Your health care provider may also give you more specific instructions. Your treatment has been planned according to current medical practices, but problems sometimes occur. Call your health care provider if you have any problems or questions after your procedure. °What can I expect after the procedure? °After your procedure, it is common: °· To feel sleepy for several hours. °· To feel clumsy and have poor balance for several hours. °· To have poor judgment for several hours. °· To vomit if you eat too soon. ° °Follow these instructions at home: °For at least 24 hours after the procedure: ° °· Do not: °? Participate in activities where you could fall or become injured. °? Drive. °? Use heavy machinery. °? Drink alcohol. °? Take sleeping pills or medicines that cause drowsiness. °? Make important decisions or sign legal documents. °? Take care of children on your own. °· Rest. °Eating and drinking °· Follow the diet recommended by your health care provider. °· If you vomit: °? Drink water, juice, or soup when you can drink without vomiting. °? Make sure you have little or no nausea before eating solid foods. °General instructions °· Have a responsible adult stay with you until you are   awake and alert.  Take over-the-counter and prescription medicines only as told by your health care provider.  If you smoke, do not smoke without supervision.  Keep all follow-up visits as told by your health care provider. This is important. Contact a health care provider if:  You keep feeling nauseous or you keep vomiting.  You feel light-headed.  You develop a  rash.  You have a fever. Get help right away if:  You have trouble breathing. This information is not intended to replace advice given to you by your health care provider. Make sure you discuss any questions you have with your health care provider. Document Released: 02/17/2013 Document Revised: 10/02/2015 Document Reviewed: 08/19/2015 Elsevier Interactive Patient Education  [employer].

## 2018-04-17 NOTE — Procedures (Signed)
Myeloma  S/p RT ILIAC BM ASP AND CORE NO COMP STABLE EBL MIN PATH PENDING FULL REPORT IN PACS

## 2018-04-17 NOTE — H&P (Signed)
Chief Complaint: Patient was seen in consultation today for bone marrow biopsy at the request of Brunetta Genera  Referring Physician(s): Brunetta Genera  Supervising Physician: Daryll Brod  Patient Status: Jonathon [name]  History of Present Illness: Jonathon [name] is a 82 y.o. male being worked up for lytic bone lesions and possible myeloma. He is referred for bone marrow biopsy. PMHx, meds, labs, imaging, allergies reviewed. Feels well, no recent fevers, chills, illness. Has been NPO today as directed. Family at bedside.   Past Medical History:  Diagnosis Date  . Asthma   . BENIGN PROSTATIC HYPERTROPHY, HX OF 10/25/2008  . Bone metastases ([place]) 04/14/2018  . BRADYCARDIA 2005  . Chronic diastolic congestive heart failure ([place]) 06/05/2016  . CIDP (chronic inflammatory demyelinating polyneuropathy) ([place])   . HYPERTENSION 11/21/2006  . Iron deficiency anemia, unspecified 04/19/2013  . 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 PACEMAKER, PERMANENT 2005   Gen change 2014 Medtronic Adaptic L dual-chamber pacemaker, serial [id number]   . SVT (supraventricular tachycardia) ([place])   . TOBACCO ABUSE 10/25/2008   Quit 2012    Past Surgical History:  Procedure Laterality Date  . COLONOSCOPY  [id number]  . PACEMAKER INSERTION  2005  . PERMANENT PACEMAKER GENERATOR CHANGE N/A 06/19/2012   Procedure: PERMANENT PACEMAKER GENERATOR CHANGE;  Surgeon: [name], MD; Medtronic Adaptic L dual-chamber pacemaker, serial #[id number]    . SKIN GRAFT Right 1962   wrist    Allergies: Lexapro [escitalopram oxalate]  Medications: Prior to Admission medications   Medication Sig Start Date End Date Taking? Authorizing Provider  aspirin EC 81 MG tablet Take 81 mg by mouth daily.   Yes [provider]  flecainide (TAMBOCOR) 50 MG tablet Take 75 mg by mouth daily as needed.   Yes [provider]  furosemide (LASIX) 40 MG tablet Take 40 mg by mouth daily.   Yes  [provider]  Garlic 0034 MG CAPS Take by mouth daily.   Yes [provider]  irbesartan (AVAPRO) 300 MG tablet Take 300 mg by mouth daily.   Yes [provider]  Melatonin 3 MG CAPS Take 3 mg by mouth at bedtime.   Yes [provider]  memantine (NAMENDA) 5 MG tablet Take 5 mg by mouth 2 (two) times daily.   Yes [provider]  metoprolol tartrate (LOPRESSOR) 25 MG tablet Take 1 tablet (25 mg total) by mouth 2 (two) times daily. 08/16/17  Yes Patrecia Pour, Christean Grief, MD  polyethylene glycol [facility] / Floria Raveling) packet Take 17 g by mouth daily. 08/17/17  Yes Patrecia Pour, Christean Grief, MD  Saccharomyces boulardii (FLORASTOR PO) Take 250 mg by mouth 2 (two) times daily.   Yes [provider]  tamsulosin (FLOMAX) 0.4 MG CAPS capsule Take 2 capsules (0.8 mg total) by mouth daily. 08/16/17  Yes Patrecia Pour, Christean Grief, MD  traMADol (ULTRAM) 50 MG tablet Take 1 tablet( 50 mg total) by mouth every 8 hrs as needed for pain 04/16/18  Yes [name], MD  vitamin B-12 (CYANOCOBALAMIN) 500 MCG tablet Take 1,000 mcg by mouth daily.   Yes [provider]  Vitamin D, Ergocalciferol, (DRISDOL) 50000 units CAPS capsule Take 50,000 Units by mouth every 7 (seven) days.   Yes [provider]     Family History  Problem Relation Age of Onset  . Heart attack Father        Died, 31  . Kidney disease Father   . Parkinson's disease Mother  Died, 86  . Breast cancer Sister        Living, 71  . Diabetes type II Brother        Living, 18  . Breast cancer Sister        Living, 85  . Diabetes Daughter        Living, 69  . Diabetes Son        Living, 81  . Ovarian cancer Daughter   . Colon cancer Neg Hx   . Rectal cancer Neg Hx   . Stomach cancer Neg Hx     Social History   Socioeconomic History  . Marital status: Divorced    Spouse name: Not on file  . Number of children: 4  . Years of education: Not on file  . Highest education level:  Not on file  Occupational History  . Occupation: ENVIROMENTAL Location manager: [employer]  . Occupation: retired  Scientific laboratory technician  . Financial resource strain: Not on file  . Food insecurity:    Worry: Not on file    Inability: Not on file  . Transportation needs:    Medical: Not on file    Non-medical: Not on file  Tobacco Use  . Smoking status: Former Smoker    Packs/day: 0.25    Years: 46.00    Pack years: 11.50    Types: Cigarettes    Start date: 03/16/1965    Last attempt to quit: 06/15/2010    Years since quitting: 7.8  . Smokeless tobacco: Never Used  . Tobacco comment: quit 3 years ago  Substance and Sexual Activity  . Alcohol use: No    Alcohol/week: 0.0 standard drinks    Comment: 4 beers per day  . Drug use: No  . Sexual activity: Not on file  Lifestyle  . Physical activity:    Days per week: Not on file    Minutes per session: Not on file  . Stress: Not on file  Relationships  . Social connections:    Talks on phone: Not on file    Gets together: Not on file    Attends religious service: Not on file    Active member of club or organization: Not on file    Attends meetings of clubs or organizations: Not on file    Relationship status: Not on file  Other Topics Concern  . Not on file  Social History Narrative   Has relocated from [place] in 2007. 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     Review of Systems: A 12 point ROS discussed and pertinent positives are indicated in the HPI above.  All other systems are negative.  Review of Systems  Vital Signs: BP (!) 158/89   Pulse 65   Temp 98.3 F (36.8 C) (Oral)   Resp 18   Ht '5\' 10"'  (1.778 m)   Wt 83.6 kg   SpO2 99%   BMI 26.44 kg/m   Physical Exam  Constitutional: He is oriented to person, place, and time. He appears well-developed and well-nourished.  HENT:  Head: Normocephalic.  Mouth/Throat: Oropharynx is clear and moist.  Neck: Normal range of motion.    Cardiovascular: Normal rate, regular rhythm and normal heart sounds.  Pulmonary/Chest: Effort normal and breath sounds normal. No respiratory distress.  Neurological: He is alert and oriented to person, place, and time.  Skin: Skin is warm and dry.    Imaging: Dg Shoulder Right  Result Date:  03/24/2018 CLINICAL DATA:  Shoulder pain following injury 2 weeks ago, initial encounter EXAM: RIGHT SHOULDER - 2+ VIEW COMPARISON:  09/05/2017. FINDINGS: Distal right clavicular fracture is noted with some downward displacement of the distal fracture fragment. No acromioclavicular separation is seen. There is a lytic lesion involving the right fifth rib posterolaterally as well as a fracture of the right sixth rib posteriorly. Some expansile lesions are seen laterally in the right sixth rib as well. IMPRESSION: Distal right clavicular fracture with some irregular margins likely related to a pathologic fracture. There are abnormalities in the right ribcage as described above also suggestive of metastatic disease. These were not present on a chest x-ray from 09/05/2017. Further nonemergent workup is recommended. Electronically Signed   By: Inez Catalina M.D.   On: 03/24/2018 11:07   Ct Chest W Contrast  Result Date: 03/31/2018 CLINICAL DATA:  83 year old male with right clavicle fracture getting out of bed last week. X-ray showed lytic lesions. Subsequent encounter. EXAM: CT CHEST, ABDOMEN, AND PELVIS WITH CONTRAST TECHNIQUE: Multidetector CT imaging of the chest, abdomen and pelvis was performed following the standard protocol during bolus administration of intravenous contrast. CONTRAST:  120m ISOVUE-300 IOPAMIDOL (ISOVUE-300) INJECTION 61% COMPARISON:  03/24/2018 clavicle films. 08/10/2014 CT abdomen and pelvis. FINDINGS: CT CHEST FINDINGS Cardiovascular: No central pulmonary embolus. No aortic dissection. Ascending thoracic aorta measures 3.9 cm. Heart size within normal limits. Sequential pacemaker in place.  Mediastinum/Nodes: Matted AP window lymph nodes which short axis dimension 1.4 cm. No hilar or axillary adenopathy Lungs/Pleura: 6 mm right apical pleural based nodule. No primary lung malignancy detected. Trachea and mainstem bronchi are patent. Musculoskeletal: Lytic lesions throughout majority of visualized osseous structures including all thoracic vertebra and multiple ribs bilaterally. Epidural extension of tumor T2 and T3 level. Multiple pathologic rib fractures with bony expansion/sub pleural extension of tumor at several levels. Lytic lesions of the scapula. Pathologic fracture right clavicle incompletely assessed. Other: Gynecomastia. CT ABDOMEN PELVIS FINDINGS Hepatobiliary: No focal hepatic lesion. Previously noted fatty infiltration less apparent. Small gallstones. Pancreas: 5 mm low-density lesion pancreatic body (series 5, image 10) unchanged. Spleen: No splenic lesion. Adrenals/Urinary Tract: No obstructing stone or hydronephrosis. Right parapelvic 2.2 cm cyst without significant change. Scattered low-density lesions within both kidneys too small to adequately characterize. Some were present previously. Statistically these are likely cysts. Hyperplasia adrenal glands without change. Circumferential urinary bladder wall thickening greater anterior dome region. Right lateral bladder diverticulum. Stomach/Bowel: Circumferential narrowing sigmoid colon. Question possibility of underlying mass (series 2, image 97). Alternatively this could represent muscular hypertrophy/peristalsis. Gastric antrum circumferential narrowing. Cannot exclude mass although this may be related to peristalsis. Vascular/Lymphatic: Atherosclerotic changes aorta and aortic branch vessels. No abdominal aortic aneurysm or large vessel occlusion. Scattered normal size lymph nodes. Reproductive: Prostate gland causes minimal impression upon the bladder base. Other: No free air or bowel containing hernia. Musculoskeletal: Lytic lesions  throughout all lumbar vertebra. Epidural extension L2 level. Right L3-4 foraminal extension of tumor. Lytic lesions throughout hips bilaterally, pelvis bilaterally and sacrum bilaterally. IMPRESSION: 1. Lytic lesion throughout all visualized vertebra from the lower cervical spine through the sacrum. Epidural extension of tumor T2, T3 and L2 level. Right L3-4 foraminal extension tumor. 2. Lytic lesions involving majority of ribs. Multiple pathologic rib fractures with bony expansion/sub pleural extension of tumor at several levels. 3. Lytic lesions of the scapula, hips bilaterally and pelvis bilaterally. With further progression of tumor, patient may be at risk for pathologic fractures. Pathologic fracture right clavicle incompletely assessed.  4. Circumferential narrowing sigmoid colon. Question possibility of underlying mass (series 2, image 97). Alternatively this could represent muscular hypertrophy/peristalsis. 5. Gastric antrum circumferential narrowing. Cannot exclude mass although this may be related to peristalsis. 6. No primary lung malignancy noted. 7. Matted aortic pulmonary window lymph nodes. 8. Circumferential urinary bladder wall thickening greater anterior dome region. Right lateral bladder diverticulum. 9. 5 mm low-density lesion pancreatic body (series 5, image 10) unchanged. This is felt unlikely to be a primary malignancy. 10. Right parapelvic 2.2 cm cyst without significant change. Scattered low-density renal lesions bilaterally too small to adequately characterize. Some were present previously. Statistically these are likely cysts. 11. Stable adrenal gland hyperplasia. 12. Gynecomastia. 13. Prostate gland causes slight impression upon the bladder base. 14.  Aortic Atherosclerosis (ICD10-I70.0). 15. Sequential pacemaker in place. 16. Gallstones. These results will be called to the ordering clinician or representative by the Radiologist Assistant, and communication documented in the PACS or  zVision Dashboard. Electronically Signed   By: Genia Del M.D.   On: 03/31/2018 13:31   Ct Abdomen Pelvis W Contrast  Result Date: 03/31/2018 CLINICAL DATA:  82 year old male with right clavicle fracture getting out of bed last week. X-ray showed lytic lesions. Subsequent encounter. EXAM: CT CHEST, ABDOMEN, AND PELVIS WITH CONTRAST TECHNIQUE: Multidetector CT imaging of the chest, abdomen and pelvis was performed following the standard protocol during bolus administration of intravenous contrast. CONTRAST:  123m ISOVUE-300 IOPAMIDOL (ISOVUE-300) INJECTION 61% COMPARISON:  03/24/2018 clavicle films. 08/10/2014 CT abdomen and pelvis. FINDINGS: CT CHEST FINDINGS Cardiovascular: No central pulmonary embolus. No aortic dissection. Ascending thoracic aorta measures 3.9 cm. Heart size within normal limits. Sequential pacemaker in place. Mediastinum/Nodes: Matted AP window lymph nodes which short axis dimension 1.4 cm. No hilar or axillary adenopathy Lungs/Pleura: 6 mm right apical pleural based nodule. No primary lung malignancy detected. Trachea and mainstem bronchi are patent. Musculoskeletal: Lytic lesions throughout majority of visualized osseous structures including all thoracic vertebra and multiple ribs bilaterally. Epidural extension of tumor T2 and T3 level. Multiple pathologic rib fractures with bony expansion/sub pleural extension of tumor at several levels. Lytic lesions of the scapula. Pathologic fracture right clavicle incompletely assessed. Other: Gynecomastia. CT ABDOMEN PELVIS FINDINGS Hepatobiliary: No focal hepatic lesion. Previously noted fatty infiltration less apparent. Small gallstones. Pancreas: 5 mm low-density lesion pancreatic body (series 5, image 10) unchanged. Spleen: No splenic lesion. Adrenals/Urinary Tract: No obstructing stone or hydronephrosis. Right parapelvic 2.2 cm cyst without significant change. Scattered low-density lesions within both kidneys too small to adequately  characterize. Some were present previously. Statistically these are likely cysts. Hyperplasia adrenal glands without change. Circumferential urinary bladder wall thickening greater anterior dome region. Right lateral bladder diverticulum. Stomach/Bowel: Circumferential narrowing sigmoid colon. Question possibility of underlying mass (series 2, image 97). Alternatively this could represent muscular hypertrophy/peristalsis. Gastric antrum circumferential narrowing. Cannot exclude mass although this may be related to peristalsis. Vascular/Lymphatic: Atherosclerotic changes aorta and aortic branch vessels. No abdominal aortic aneurysm or large vessel occlusion. Scattered normal size lymph nodes. Reproductive: Prostate gland causes minimal impression upon the bladder base. Other: No free air or bowel containing hernia. Musculoskeletal: Lytic lesions throughout all lumbar vertebra. Epidural extension L2 level. Right L3-4 foraminal extension of tumor. Lytic lesions throughout hips bilaterally, pelvis bilaterally and sacrum bilaterally. IMPRESSION: 1. Lytic lesion throughout all visualized vertebra from the lower cervical spine through the sacrum. Epidural extension of tumor T2, T3 and L2 level. Right L3-4 foraminal extension tumor. 2. Lytic lesions involving majority of ribs. Multiple pathologic  rib fractures with bony expansion/sub pleural extension of tumor at several levels. 3. Lytic lesions of the scapula, hips bilaterally and pelvis bilaterally. With further progression of tumor, patient may be at risk for pathologic fractures. Pathologic fracture right clavicle incompletely assessed. 4. Circumferential narrowing sigmoid colon. Question possibility of underlying mass (series 2, image 97). Alternatively this could represent muscular hypertrophy/peristalsis. 5. Gastric antrum circumferential narrowing. Cannot exclude mass although this may be related to peristalsis. 6. No primary lung malignancy noted. 7. Matted aortic  pulmonary window lymph nodes. 8. Circumferential urinary bladder wall thickening greater anterior dome region. Right lateral bladder diverticulum. 9. 5 mm low-density lesion pancreatic body (series 5, image 10) unchanged. This is felt unlikely to be a primary malignancy. 10. Right parapelvic 2.2 cm cyst without significant change. Scattered low-density renal lesions bilaterally too small to adequately characterize. Some were present previously. Statistically these are likely cysts. 11. Stable adrenal gland hyperplasia. 12. Gynecomastia. 13. Prostate gland causes slight impression upon the bladder base. 14.  Aortic Atherosclerosis (ICD10-I70.0). 15. Sequential pacemaker in place. 16. Gallstones. These results will be called to the ordering clinician or representative by the Radiologist Assistant, and communication documented in the PACS or zVision Dashboard. Electronically Signed   By: Genia Del M.D.   On: 03/31/2018 13:31    Labs:  CBC: Recent Labs    09/09/17 03/25/18 1424 04/14/18 1159 04/17/18 0909  WBC 9.9 6.6 5.3 4.6  HGB 9.4* 11.3* 10.6* 10.1*  HCT 29* 33.6* 32.4* 32.4*  PLT 201 193.0 191 189    COAGS: Recent Labs    04/17/18 0909  INR 1.12  APTT 31    BMP: Recent Labs    09/05/17 1357 09/06/17 0710 09/07/17 0548 09/09/17 03/25/18 1424 04/14/18 1159  NA 134* 135 134* 138 135 140  K 3.6 3.4* 3.1* 3.6 3.7 3.8  CL 99* 102 105  --  94* 101  CO2 '22 24 24  ' --  33* 28  GLUCOSE 120* 101* 95  --  100* 88  BUN '14 16 17 12 ' 25* 17  CALCIUM 9.0 8.1* 7.9*  --  13.8* 11.1*  10.9*  CREATININE 1.33* 1.21 1.04 0.8 1.48 1.59*  GFRNONAA 49* 55* >60  --   --  40*  GFRAA 57* >60 >60  --   --  47*    LIVER FUNCTION TESTS: Recent Labs    08/10/17 0331 09/05/17 1357 03/25/18 1424 04/14/18 1159  BILITOT 1.0 1.5* 0.5 0.5  AST 50* 38 21 19  ALT '29 25 10 11  ' ALKPHOS 68 87 90 145*  PROT 7.8 7.4 9.4* 9.8*  ALBUMIN 3.3* 2.9* 3.4* 3.0*    TUMOR MARKERS: No results for input(s):  AFPTM, CEA, CA199, CHROMGRNA in the last 8760 hours.  Assessment and Plan: Lytic bone lesions, suspicious for myeloma. For CT guided bone marrow biopsy. Labs ok Risks and benefits discussed with the patient including, but not limited to bleeding, infection, damage to adjacent structures or low yield requiring additional tests.  All of the patient's questions were answered, patient is agreeable to proceed. Consent signed and in chart.    Thank you for this interesting consult.  I greatly enjoyed meeting ALDEAN PIPE and look forward to participating in their care.  A copy of this report was sent to the requesting provider on this date.  Electronically Signed: Ascencion Dike, PA-C 04/17/2018, 10:18 AM   I spent a total of 20 minutes in face to face in clinical consultation, greater than 50%  of which was counseling/coordinating care for bone marrow biopsy

## 2018-04-23 ENCOUNTER — Ambulatory Visit (HOSPITAL_COMMUNITY)
Admission: RE | Admit: 2018-04-23 | Discharge: 2018-04-23 | Disposition: A | Discharge status: Home / Self Care | Payer: Medicare Other | Source: Ambulatory Visit | Attending: Hematology | Admitting: Hematology

## 2018-04-23 DIAGNOSIS — C9010 Plasma cell leukemia not having achieved remission: Secondary | ICD-10-CM | POA: Diagnosis not present

## 2018-04-23 DIAGNOSIS — C7951 Secondary malignant neoplasm of bone: Secondary | ICD-10-CM | POA: Insufficient documentation

## 2018-04-23 DIAGNOSIS — C801 Malignant (primary) neoplasm, unspecified: Secondary | ICD-10-CM | POA: Insufficient documentation

## 2018-04-23 LAB — GLUCOSE, CAPILLARY: Glucose-Capillary: 71 mg/dL (ref 70–99)

## 2018-04-23 MED ORDER — FLUDEOXYGLUCOSE F - 18 (FDG) INJECTION
9.5000 | Freq: Once | INTRAVENOUS | Status: AC
  Administered 2018-04-23: 9.5 via INTRAVENOUS

## 2018-04-23 NOTE — Progress Notes (Signed)
HEMATOLOGY/ONCOLOGY CLINIC NOTE  Date of Service: 04/23/2018  Patient Care Team: [name], MD as PCP - General (Family Medicine) [name], MD (Cardiology) [name], MD (Cardiology)  CHIEF COMPLAINTS/PURPOSE OF CONSULTATION:  Newly diagnosed myeloma  HISTORY OF PRESENTING ILLNESS:   Jonathon [name] is a wonderful 82 y.o. male who has been referred to [place] by Dr. [name] for evaluation and management of Lytic bone lesions. He is accompanied today by his daughter. The pt reports that he is doing well overall. He is currently a resident at [facility] in [place], [place].   The pt presented to the ED on 03/24/18 with a right distal clavicle fracture, which occurred after trying to get out of bed and pushing himself up with his right arm. He notes that his energy has been noticeably decreased for the last month. He also notes that his lower back has a slight sting when trying to stand up from sitting. He also notes some pain in his lower, right chest wall, which is exacerbated when coughing. The pt has been taking '50mg'$  Tramadol which is controlling his pain. He does endorse some weakening of both of his lower extremities in the past 1-2 months, which he attributes somewhat to his recent lower back pain.   The pt denies any bowel abnormalities recently. He notes that he stays very well hydrated, and urinates 4-5 times a night, and does have BPH, and takes Flomax. The pt denies any recent changes in his urination habits.   The pt notes that his appetite decreased for a few months, until he began assisted living. He has lost 9 pounds in the last week.   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 He now uses a walker to ambulate. He notes that the CIDP symptoms are primarily located to his legs. He sees Dr. Narda Amber in Neurology. He took steroids  without any symptomatic relief. IVIG was cost-prohibitive for the patient, but was recommended.    Regarding the patient's anemia in April 2019, he denies any obvious bleeding at that time, and denies surgeries.    The pt notes that his last colonoscopy, in 2015, was not concerning, which is corroborated by chart review.   The pt and daughter deny any concerns for memory issues, and the daughter adds his memory is "elephant-like". They are not sure why Namenda is on his medication list.   The pt denies any recent dental procedures, or concerns for dental issues. He has maintained every 6 month dental cleanings for all of his life.   Of note prior to the patient's visit today, pt has had a CT C/A/P completed on 03/31/18 with results revealing IMPRESSION: 1. Lytic lesion throughout all visualized vertebra from the lower cervical spine through the sacrum. Epidural extension of tumor T2, T3 and L2 level. Right L3-4 foraminal extension tumor. 2. Lytic lesions involving majority of ribs. Multiple pathologic rib fractures with bony expansion/sub pleural extension of tumor at several levels. 3. Lytic lesions of the scapula, hips bilaterally and pelvis bilaterally. With further progression of tumor, patient may be at risk for pathologic fractures. Pathologic fracture right clavicle incompletely assessed. 4. Circumferential narrowing sigmoid colon. Question possibility of underlying mass (series 2, image 97). Alternatively this could represent muscular hypertrophy/peristalsis. 5. Gastric antrum circumferential narrowing. Cannot exclude mass although this may  be related to peristalsis. 6. No primary lung malignancy noted. 7. Matted aortic pulmonary window lymph nodes. 8. Circumferential urinary bladder wall thickening greater anterior dome region. Right lateral bladder diverticulum. 9. 5 mm low-density lesion pancreatic body (series 5, image 10) unchanged. This is felt unlikely to be a primary malignancy. 10.  Right parapelvic 2.2 cm cyst without significant change. Scattered low-density renal lesions bilaterally too small to adequately characterize. Some were present previously. Statistically these are likely cysts. 11. Stable adrenal gland hyperplasia. 12. Gynecomastia. 13. Prostate gland causes slight impression upon the bladder base. 14.  Aortic Atherosclerosis. 15. Sequential pacemaker in place. 16. Gallstones.  Most recent lab results (03/25/18) of CBC w/diff and CMP is as follows: all values are WNL except for RBC at 4.14, HGB at 11.3, HCT at 33.6, Chloride at 94, CO2 at 33, Glucose at 100, BUN at 25, Total Protein at 9.4, Albumin at 3.4, Calcium at 13.8, GFR at 58. 03/25/18 PSA, Total was normal at 1.2  On review of systems, pt reports positional lower back pain, lower right chest wall pain, recently decreased energy levels, relatively weaker appetite, weight loss, right clavicle pain, weakening of the lower extremities, moving his bowels well, and denies changes in urination, abdominal pains, changes in breathing, changes in bowel habits, pain along the spine, leg swelling, and any other symptoms.  Interval History:   Jonathon [name] returns today for management and evaluation of his lytic bone lesions. The patient's last visit with [place] was on 04/14/18. He is accompanied today by his daughter. The pt reports that he is doing well overall.   The pt reports that he has not developed any new concerns in the short interim. His right clavicle fracture is still a little painful and he continues on Tramadol for pain control. He endorses stable energy levels.  The pt also notes that his ribs do intermittently hurt, but this is not something he is normally aware of and is not very bothersome for him.   The pt notes that he hasn't had a UTI "in a while," and denies concerns for current or recent infections.   The pt was diagnosed with CIDP 5 years ago.   Of note since the patient's last visit, pt has had a  PET/CT completed on 04/23/18 with results revealing Innumerable hypermetabolic lytic lesions throughout the skeleton especially involving the spine, ribs, bony pelvis along with some involvement of both proximal femurs. The appearance is compatible with pathologic diagnosis of plasma cell neoplasm could well reflect multiple myeloma. Resulting pathologic fractures of the right lateral clavicle and of multiple bilateral ribs. Lesions are present along the cortical margins of the spinal canal. 2. Both the area and the stomach in the area in the sigmoid colon drawn attention to on prior CT scan appear benign normal today. 3. The confluence of AP window lymph nodes measures about 1.2 cm in short axis with maximum SUV 3.1, which is mildly above the blood pool merit surveillance. 4. Other imaging findings of potential clinical significance: Aortic Atherosclerosis. Coronary atherosclerosis. Pacemaker noted. Borderline prostatomegaly. Cholelithiasis.  The pt also had a BM Bx on 04/17/18 which revealed Normocellular bone marrow with Plasma cell neoplasm   Lab results (04/14/18) of CBC w/diff and CMP is as follows: all values are WNL except for RBC at 3.92, HGB at 10.6, HCT at 32.4, Creatinine at 1.59, Calcium at 11.1, Total Protein at 9.8, Albumin at 3.0, Alk Phos at 145, GFR at 40.  04/14/18 MMP revealed all  values WNL except for IgA at 3485, Total Protein at 9.3, B-globulin at 4.3, M Protein at 3.4, Globulin total at 6.2, Albumin/Glob at 0.6  04/16/18 24 hour UPEP revealed all values WNL except for Total Protein/day at 726 04/14/18 LDH at 204 04/14/18 Sed Rate at 131 04/14/18 Vitamin D at 138.8 04/14/18 SFLC revealed Kappa at 48 and K:L ratio at 2.55 04/14/18 PTH at 8, Calcium Total (PTH) at 10.9  On review of systems, pt reports controlled right clavicle pain, stable energy levels, intermittent rib pain, and denies fevers, chills, night sweats, concerns for infections, and any other symptoms.   MEDICAL HISTORY:    Past Medical History:  Diagnosis Date  . Asthma   . BENIGN PROSTATIC HYPERTROPHY, HX OF 10/25/2008  . Bone metastases ([place]) 04/14/2018  . BRADYCARDIA 2005  . Chronic diastolic congestive heart failure ([place]) 06/05/2016  . CIDP (chronic inflammatory demyelinating polyneuropathy) ([place])   . HYPERTENSION 11/21/2006  . Iron deficiency anemia, unspecified 04/19/2013  . 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 PACEMAKER, PERMANENT 2005   Gen change 2014 Medtronic Adaptic L dual-chamber pacemaker, serial [id number]   . SVT (supraventricular tachycardia) ([place])   . TOBACCO ABUSE 10/25/2008   Quit 2012    SURGICAL HISTORY: Past Surgical History:  Procedure Laterality Date  . COLONOSCOPY  [id number]  . PACEMAKER INSERTION  2005  . PERMANENT PACEMAKER GENERATOR CHANGE N/A 06/19/2012   Procedure: PERMANENT PACEMAKER GENERATOR CHANGE;  Surgeon: [name], MD; Medtronic Adaptic L dual-chamber pacemaker, serial #[id number]    . SKIN GRAFT Right 1962   wrist    SOCIAL HISTORY: Social History   Socioeconomic History  . Marital status: Divorced    Spouse name: Not on file  . Number of children: 4  . Years of education: Not on file  . Highest education level: Not on file  Occupational History  . Occupation: ENVIROMENTAL Location manager: [employer]  . Occupation: retired  Scientific laboratory technician  . Financial resource strain: Not on file  . Food insecurity:    Worry: Not on file    Inability: Not on file  . Transportation needs:    Medical: Not on file    Non-medical: Not on file  Tobacco Use  . Smoking status: Former Smoker    Packs/day: 0.25    Years: 46.00    Pack years: 11.50    Types: Cigarettes    Start date: 03/16/1965    Last attempt to quit: 06/15/2010    Years since quitting: 7.8  . Smokeless tobacco: Never Used  . Tobacco comment: quit 3 years ago  Substance and Sexual Activity  . Alcohol use: No    Alcohol/week: 0.0 standard drinks    Comment: 4 beers  per day  . Drug use: No  . Sexual activity: Not on file  Lifestyle  . Physical activity:    Days per week: Not on file    Minutes per session: Not on file  . Stress: Not on file  Relationships  . Social connections:    Talks on phone: Not on file    Gets together: Not on file    Attends religious service: Not on file    Active member of club or organization: Not on file    Attends meetings of clubs or organizations: Not on file    Relationship status: Not on file  . Intimate partner violence:    Fear of current or ex partner: Not on file  Emotionally abused: Not on file    Physically abused: Not on file    Forced sexual activity: Not on file  Other Topics Concern  . Not on file  Social History Narrative   Has relocated from [place] in 2007. 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    FAMILY HISTORY: Family History  Problem Relation Age of Onset  . Heart attack Father        Died, 25  . Kidney disease Father   . Parkinson's disease Mother        Died, 31  . Breast cancer Sister        Living, 89  . Diabetes type II Brother        Living, 22  . Breast cancer Sister        Living, 87  . Diabetes Daughter        Living, 44  . Diabetes Son        Living, 16  . Ovarian cancer Daughter   . Colon cancer Neg Hx   . Rectal cancer Neg Hx   . Stomach cancer Neg Hx     ALLERGIES:  is allergic to lexapro [escitalopram oxalate].  MEDICATIONS:  Current Outpatient Medications  Medication Sig Dispense Refill  . aspirin EC 81 MG tablet Take 81 mg by mouth daily.    . flecainide (TAMBOCOR) 50 MG tablet Take 75 mg by mouth daily as needed.    . furosemide (LASIX) 40 MG tablet Take 40 mg by mouth daily.    . Garlic 4854 MG CAPS Take by mouth daily.    . irbesartan (AVAPRO) 300 MG tablet Take 300 mg by mouth daily.    . Melatonin 3 MG CAPS Take 3 mg by mouth at bedtime.    . memantine (NAMENDA) 5 MG tablet Take 5 mg by mouth 2 (two) times daily.    . metoprolol  tartrate (LOPRESSOR) 25 MG tablet Take 1 tablet (25 mg total) by mouth 2 (two) times daily.    . polyethylene glycol (MIRALAX / GLYCOLAX) packet Take 17 g by mouth daily. 14 each 0  . Saccharomyces boulardii (FLORASTOR PO) Take 250 mg by mouth 2 (two) times daily.    . tamsulosin (FLOMAX) 0.4 MG CAPS capsule Take 2 capsules (0.8 mg total) by mouth daily. 90 capsule 0  . traMADol (ULTRAM) 50 MG tablet Take 1 tablet( 50 mg total) by mouth every 8 hrs as needed for pain 90 tablet 2  . vitamin B-12 (CYANOCOBALAMIN) 500 MCG tablet Take 1,000 mcg by mouth daily.    . Vitamin D, Ergocalciferol, (DRISDOL) 50000 units CAPS capsule Take 50,000 Units by mouth every 7 (seven) days.     No current facility-administered medications for this visit.     REVIEW OF SYSTEMS:    A 10+ POINT REVIEW OF SYSTEMS WAS OBTAINED including neurology, dermatology, psychiatry, cardiac, respiratory, lymph, extremities, GI, GU, Musculoskeletal, constitutional, breasts, reproductive, HEENT.  All pertinent positives are noted in the HPI.  All others are negative.   PHYSICAL EXAMINATION: ECOG PERFORMANCE STATUS: 2 - Symptomatic, <50% confined to bed  . Vitals:   04/24/18 1059  BP: (!) 176/94  Pulse: 73  Resp: 18  Temp: 98.2 F (36.8 C)  SpO2: 100%   Filed Weights   04/24/18 1059  Weight: 189 lb 9.6 oz (86 kg)   .Body mass index is 27.2 kg/m.  GENERAL:alert, in no acute distress and comfortable SKIN: no acute rashes, no significant lesions EYES:  conjunctiva are pink and non-injected, sclera anicteric OROPHARYNX: MMM, no exudates, no oropharyngeal erythema or ulceration NECK: supple, no JVD LYMPH:  no palpable lymphadenopathy in the cervical, axillary or inguinal regions LUNGS: clear to auscultation b/l with normal respiratory effort HEART: regular rate & rhythm ABDOMEN:  normoactive bowel sounds , non tender, not distended. No palpable hepatosplenomegaly.  Extremity: no pedal edema PSYCH: alert & oriented x  3 with fluent speech NEURO: no focal motor/sensory deficits   LABORATORY DATA:  I have reviewed the data as listed  . CBC Latest Ref Rng & Units 04/17/2018 04/14/2018 03/25/2018  WBC 4.0 - 10.5 K/uL 4.6 5.3 6.6  Hemoglobin 13.0 - 17.0 g/dL 10.1(L) 10.6(L) 11.3(L)  Hematocrit 39.0 - 52.0 % 32.4(L) 32.4(L) 33.6(L)  Platelets 150 - 400 K/uL 189 191 193.0    . CMP Latest Ref Rng & Units 04/14/2018 04/14/2018 03/25/2018  Glucose 70 - 99 mg/dL 88 - 100(H)  BUN 8 - 23 mg/dL 17 - 25(H)  Creatinine 0.61 - 1.24 mg/dL 1.59(H) - 1.48  Sodium 135 - 145 mmol/L 140 - 135  Potassium 3.5 - 5.1 mmol/L 3.8 - 3.7  Chloride 98 - 111 mmol/L 101 - 94(L)  CO2 22 - 32 mmol/L 28 - 33(H)  Calcium 8.6 - 10.2 mg/dL 11.1(H) 10.9(H) 13.8(HH)  Total Protein 6.5 - 8.1 g/dL 9.8(H) - 9.4(H)  Total Bilirubin 0.3 - 1.2 mg/dL 0.5 - 0.5  Alkaline Phos 38 - 126 U/L 145(H) - 90  AST 15 - 41 U/L 19 - 21  ALT 0 - 44 U/L 11 - 10   04/17/18 BM Bx:    RADIOGRAPHIC STUDIES: I have personally reviewed the radiological images as listed and agreed with the findings in the report. Ct Chest W Contrast  Result Date: 03/31/2018 CLINICAL DATA:  82 year old male with right clavicle fracture getting out of bed last week. X-ray showed lytic lesions. Subsequent encounter. EXAM: CT CHEST, ABDOMEN, AND PELVIS WITH CONTRAST TECHNIQUE: Multidetector CT imaging of the chest, abdomen and pelvis was performed following the standard protocol during bolus administration of intravenous contrast. CONTRAST:  167m ISOVUE-300 IOPAMIDOL (ISOVUE-300) INJECTION 61% COMPARISON:  03/24/2018 clavicle films. 08/10/2014 CT abdomen and pelvis. FINDINGS: CT CHEST FINDINGS Cardiovascular: No central pulmonary embolus. No aortic dissection. Ascending thoracic aorta measures 3.9 cm. Heart size within normal limits. Sequential pacemaker in place. Mediastinum/Nodes: Matted AP window lymph nodes which short axis dimension 1.4 cm. No hilar or axillary adenopathy  Lungs/Pleura: 6 mm right apical pleural based nodule. No primary lung malignancy detected. Trachea and mainstem bronchi are patent. Musculoskeletal: Lytic lesions throughout majority of visualized osseous structures including all thoracic vertebra and multiple ribs bilaterally. Epidural extension of tumor T2 and T3 level. Multiple pathologic rib fractures with bony expansion/sub pleural extension of tumor at several levels. Lytic lesions of the scapula. Pathologic fracture right clavicle incompletely assessed. Other: Gynecomastia. CT ABDOMEN PELVIS FINDINGS Hepatobiliary: No focal hepatic lesion. Previously noted fatty infiltration less apparent. Small gallstones. Pancreas: 5 mm low-density lesion pancreatic body (series 5, image 10) unchanged. Spleen: No splenic lesion. Adrenals/Urinary Tract: No obstructing stone or hydronephrosis. Right parapelvic 2.2 cm cyst without significant change. Scattered low-density lesions within both kidneys too small to adequately characterize. Some were present previously. Statistically these are likely cysts. Hyperplasia adrenal glands without change. Circumferential urinary bladder wall thickening greater anterior dome region. Right lateral bladder diverticulum. Stomach/Bowel: Circumferential narrowing sigmoid colon. Question possibility of underlying mass (series 2, image 97). Alternatively this could represent muscular hypertrophy/peristalsis. Gastric antrum circumferential  narrowing. Cannot exclude mass although this may be related to peristalsis. Vascular/Lymphatic: Atherosclerotic changes aorta and aortic branch vessels. No abdominal aortic aneurysm or large vessel occlusion. Scattered normal size lymph nodes. Reproductive: Prostate gland causes minimal impression upon the bladder base. Other: No free air or bowel containing hernia. Musculoskeletal: Lytic lesions throughout all lumbar vertebra. Epidural extension L2 level. Right L3-4 foraminal extension of tumor. Lytic lesions  throughout hips bilaterally, pelvis bilaterally and sacrum bilaterally. IMPRESSION: 1. Lytic lesion throughout all visualized vertebra from the lower cervical spine through the sacrum. Epidural extension of tumor T2, T3 and L2 level. Right L3-4 foraminal extension tumor. 2. Lytic lesions involving majority of ribs. Multiple pathologic rib fractures with bony expansion/sub pleural extension of tumor at several levels. 3. Lytic lesions of the scapula, hips bilaterally and pelvis bilaterally. With further progression of tumor, patient may be at risk for pathologic fractures. Pathologic fracture right clavicle incompletely assessed. 4. Circumferential narrowing sigmoid colon. Question possibility of underlying mass (series 2, image 97). Alternatively this could represent muscular hypertrophy/peristalsis. 5. Gastric antrum circumferential narrowing. Cannot exclude mass although this may be related to peristalsis. 6. No primary lung malignancy noted. 7. Matted aortic pulmonary window lymph nodes. 8. Circumferential urinary bladder wall thickening greater anterior dome region. Right lateral bladder diverticulum. 9. 5 mm low-density lesion pancreatic body (series 5, image 10) unchanged. This is felt unlikely to be a primary malignancy. 10. Right parapelvic 2.2 cm cyst without significant change. Scattered low-density renal lesions bilaterally too small to adequately characterize. Some were present previously. Statistically these are likely cysts. 11. Stable adrenal gland hyperplasia. 12. Gynecomastia. 13. Prostate gland causes slight impression upon the bladder base. 14.  Aortic Atherosclerosis (ICD10-I70.0). 15. Sequential pacemaker in place. 16. Gallstones. These results will be called to the ordering clinician or representative by the Radiologist Assistant, and communication documented in the PACS or zVision Dashboard. Electronically Signed   By: Genia Del M.D.   On: 03/31/2018 13:31   Ct Abdomen Pelvis W  Contrast  Result Date: 03/31/2018 CLINICAL DATA:  82 year old male with right clavicle fracture getting out of bed last week. X-ray showed lytic lesions. Subsequent encounter. EXAM: CT CHEST, ABDOMEN, AND PELVIS WITH CONTRAST TECHNIQUE: Multidetector CT imaging of the chest, abdomen and pelvis was performed following the standard protocol during bolus administration of intravenous contrast. CONTRAST:  153m ISOVUE-300 IOPAMIDOL (ISOVUE-300) INJECTION 61% COMPARISON:  03/24/2018 clavicle films. 08/10/2014 CT abdomen and pelvis. FINDINGS: CT CHEST FINDINGS Cardiovascular: No central pulmonary embolus. No aortic dissection. Ascending thoracic aorta measures 3.9 cm. Heart size within normal limits. Sequential pacemaker in place. Mediastinum/Nodes: Matted AP window lymph nodes which short axis dimension 1.4 cm. No hilar or axillary adenopathy Lungs/Pleura: 6 mm right apical pleural based nodule. No primary lung malignancy detected. Trachea and mainstem bronchi are patent. Musculoskeletal: Lytic lesions throughout majority of visualized osseous structures including all thoracic vertebra and multiple ribs bilaterally. Epidural extension of tumor T2 and T3 level. Multiple pathologic rib fractures with bony expansion/sub pleural extension of tumor at several levels. Lytic lesions of the scapula. Pathologic fracture right clavicle incompletely assessed. Other: Gynecomastia. CT ABDOMEN PELVIS FINDINGS Hepatobiliary: No focal hepatic lesion. Previously noted fatty infiltration less apparent. Small gallstones. Pancreas: 5 mm low-density lesion pancreatic body (series 5, image 10) unchanged. Spleen: No splenic lesion. Adrenals/Urinary Tract: No obstructing stone or hydronephrosis. Right parapelvic 2.2 cm cyst without significant change. Scattered low-density lesions within both kidneys too small to adequately characterize. Some were present previously. Statistically these are likely  cysts. Hyperplasia adrenal glands without  change. Circumferential urinary bladder wall thickening greater anterior dome region. Right lateral bladder diverticulum. Stomach/Bowel: Circumferential narrowing sigmoid colon. Question possibility of underlying mass (series 2, image 97). Alternatively this could represent muscular hypertrophy/peristalsis. Gastric antrum circumferential narrowing. Cannot exclude mass although this may be related to peristalsis. Vascular/Lymphatic: Atherosclerotic changes aorta and aortic branch vessels. No abdominal aortic aneurysm or large vessel occlusion. Scattered normal size lymph nodes. Reproductive: Prostate gland causes minimal impression upon the bladder base. Other: No free air or bowel containing hernia. Musculoskeletal: Lytic lesions throughout all lumbar vertebra. Epidural extension L2 level. Right L3-4 foraminal extension of tumor. Lytic lesions throughout hips bilaterally, pelvis bilaterally and sacrum bilaterally. IMPRESSION: 1. Lytic lesion throughout all visualized vertebra from the lower cervical spine through the sacrum. Epidural extension of tumor T2, T3 and L2 level. Right L3-4 foraminal extension tumor. 2. Lytic lesions involving majority of ribs. Multiple pathologic rib fractures with bony expansion/sub pleural extension of tumor at several levels. 3. Lytic lesions of the scapula, hips bilaterally and pelvis bilaterally. With further progression of tumor, patient may be at risk for pathologic fractures. Pathologic fracture right clavicle incompletely assessed. 4. Circumferential narrowing sigmoid colon. Question possibility of underlying mass (series 2, image 97). Alternatively this could represent muscular hypertrophy/peristalsis. 5. Gastric antrum circumferential narrowing. Cannot exclude mass although this may be related to peristalsis. 6. No primary lung malignancy noted. 7. Matted aortic pulmonary window lymph nodes. 8. Circumferential urinary bladder wall thickening greater anterior dome region. Right  lateral bladder diverticulum. 9. 5 mm low-density lesion pancreatic body (series 5, image 10) unchanged. This is felt unlikely to be a primary malignancy. 10. Right parapelvic 2.2 cm cyst without significant change. Scattered low-density renal lesions bilaterally too small to adequately characterize. Some were present previously. Statistically these are likely cysts. 11. Stable adrenal gland hyperplasia. 12. Gynecomastia. 13. Prostate gland causes slight impression upon the bladder base. 14.  Aortic Atherosclerosis (ICD10-I70.0). 15. Sequential pacemaker in place. 16. Gallstones. These results will be called to the ordering clinician or representative by the Radiologist Assistant, and communication documented in the PACS or zVision Dashboard. Electronically Signed   By: Genia Del M.D.   On: 03/31/2018 13:31   Ct Biopsy  Result Date: 04/17/2018 INDICATION: MYELOMA EXAM: CT GUIDED RIGHT ILIAC BONE MARROW ASPIRATION AND CORE BIOPSY Date:  04/17/2018 04/17/2018 12:10 pm Radiologist:  Jerilynn Mages. Daryll Brod, MD Guidance:  CT FLUOROSCOPY TIME:  Fluoroscopy Time: None. MEDICATIONS: None. ANESTHESIA/SEDATION: 2.0 mg IV Versed; 100 mcg IV Fentanyl Moderate Sedation Time:  12 minutes The patient was continuously monitored during the procedure by the interventional radiology nurse under my direct supervision. CONTRAST:  None COMPLICATIONS: None PROCEDURE: Informed consent was obtained from the patient following explanation of the procedure, risks, benefits and alternatives. The patient understands, agrees and consents for the procedure. All questions were addressed. A time out was performed. The patient was positioned prone and non-contrast localization CT was performed of the pelvis to demonstrate the iliac marrow spaces. Maximal barrier sterile technique utilized including caps, mask, sterile gowns, sterile gloves, large sterile drape, hand hygiene, and Betadine prep. Under sterile conditions and local anesthesia, an 11 gauge  coaxial bone biopsy needle was advanced into the right iliac marrow space. Needle position was confirmed with CT imaging. Initially, bone marrow aspiration was performed. Next, the 11 gauge outer cannula was utilized to obtain a right iliac bone marrow core biopsy. Needle was removed. Hemostasis was obtained with compression. The patient tolerated the  procedure well. Samples were prepared with the cytotechnologist. No immediate complications. IMPRESSION: CT guided right iliac bone marrow aspiration and core biopsy. Electronically Signed   By: Jerilynn Mages.  Shick M.D.   On: 04/17/2018 12:42   Ct Bone Marrow Biopsy & Aspiration  Result Date: 04/17/2018 INDICATION: MYELOMA EXAM: CT GUIDED RIGHT ILIAC BONE MARROW ASPIRATION AND CORE BIOPSY Date:  04/17/2018 04/17/2018 12:10 pm Radiologist:  Jerilynn Mages. Daryll Brod, MD Guidance:  CT FLUOROSCOPY TIME:  Fluoroscopy Time: None. MEDICATIONS: None. ANESTHESIA/SEDATION: 2.0 mg IV Versed; 100 mcg IV Fentanyl Moderate Sedation Time:  12 minutes The patient was continuously monitored during the procedure by the interventional radiology nurse under my direct supervision. CONTRAST:  None COMPLICATIONS: None PROCEDURE: Informed consent was obtained from the patient following explanation of the procedure, risks, benefits and alternatives. The patient understands, agrees and consents for the procedure. All questions were addressed. A time out was performed. The patient was positioned prone and non-contrast localization CT was performed of the pelvis to demonstrate the iliac marrow spaces. Maximal barrier sterile technique utilized including caps, mask, sterile gowns, sterile gloves, large sterile drape, hand hygiene, and Betadine prep. Under sterile conditions and local anesthesia, an 11 gauge coaxial bone biopsy needle was advanced into the right iliac marrow space. Needle position was confirmed with CT imaging. Initially, bone marrow aspiration was performed. Next, the 11 gauge outer cannula was  utilized to obtain a right iliac bone marrow core biopsy. Needle was removed. Hemostasis was obtained with compression. The patient tolerated the procedure well. Samples were prepared with the cytotechnologist. No immediate complications. IMPRESSION: CT guided right iliac bone marrow aspiration and core biopsy. Electronically Signed   By: Jerilynn Mages.  Shick M.D.   On: 04/17/2018 12:42    ASSESSMENT & PLAN:  82 y.o. male with  1. Lytic bone lesions - w/u consistent with newly diagnosed multiple myeloma Labs upon initial presentation from 04/14/18; hypercalcemic with Calcium at 13.8, renal function up form baseline with Creatinine at 1.48, HGB at 11.3. PSA was normal.   03/31/18 CT C/A/P revealed 1. Lytic lesion throughout all visualized vertebra from the lower cervical spine through the sacrum. Epidural extension of tumor T2, T3 and L2 level. Right L3-4 foraminal extension tumor. 2. Lytic lesions involving majority of ribs. Multiple pathologic rib fractures with bony expansion/sub pleural extension of tumor at several levels. 3. Lytic lesions of the scapula, hips bilaterally and pelvis bilaterally. With further progression of tumor, patient may be at risk for pathologic fractures. Pathologic fracture right clavicle incompletely assessed. 4. Circumferential narrowing sigmoid colon. Question possibility of underlying mass (series 2, image 97). Alternatively this could represent muscular hypertrophy/peristalsis. 5. Gastric antrum circumferential narrowing. Cannot exclude mass although this may be related to peristalsis. 6. No primary lung malignancy noted. 7. Matted aortic pulmonary window lymph nodes. 8. Circumferential urinary bladder wall thickening greater anterior dome region. Right lateral bladder diverticulum. 9. 5 mm low-density lesion pancreatic body (series 5, image 10) unchanged. This is felt unlikely to be a primary malignancy. 10. Right parapelvic 2.2 cm cyst without significant change. Scattered low-density  renal lesions bilaterally too small to adequately characterize. Some were present previously. Statistically these are likely cysts. 11. Stable adrenal gland hyperplasia. 12. Gynecomastia. 13. Prostate gland causes slight impression upon the bladder base. 14.  Aortic Atherosclerosis. 15. Sequential pacemaker in place. 16. Gallstones.   2. Pathologic right clavicle fracture 3. Hypercalcemia 03/25/18 CMP revealed Calcium at 13.8-- now down to 11.1 Will begin Xgeva injections, and strongly recommended keeping very  well hydrated   PLAN:  -Discussed pt labwork from 04/14/18; M spike at 3.4 with IgA kappa specificity, no urinary M spike but Total Protein of '726mg'$ /day noted, Calcium decreased to 11.1 from 13.8, Creatinine at 1.59, Kappa light chains serum at '48mg'$  with a K:L ratio at 2.55, Sed Rate at 131, Vitamin D elevated at 138.8 -Patient meets CRAB criteria with hypercalcemia, worsened renal function from baseline, anemia, and bone tumors -Discussed the 04/23/18 PET/CT which revealed Innumerable hypermetabolic lytic lesions throughout the skeleton especially involving the spine, ribs, bony pelvis along with some involvement of both proximal femurs. The appearance is compatible with pathologic diagnosis of plasma cell neoplasm could well reflect multiple myeloma. Resulting pathologic fractures of the right lateral clavicle and of multiple bilateral ribs. Lesions are present along the cortical margins of the spinal canal. 2. Both the area and the stomach in the area in the sigmoid colon drawn attention to on prior CT scan appear benign normal today. 3. The confluence of AP window lymph nodes measures about 1.2 cm in short axis with maximum SUV 3.1, which is mildly above the blood pool merit surveillance. 4. Other imaging findings of potential clinical significance: Aortic Atherosclerosis. Coronary atherosclerosis. Pacemaker noted. Borderline prostatomegaly. Cholelithiasis. -Discussed the 04/21/18 BM Bx which  revealed Normocellular bone marrow with Plasma Cell neoplasm. Scattered medium sized clusters of kappa-restricted plasma cells (14% aspirate, 10% CD138 immunohistochemistry.  -I do suspect that the burden of disease is underestimated in the bone marrow sample given PET/CT findings of innumerable lytic lesions and an M spike of 3.4g -Discussed that the above work up does indicate a diagnosis of Multiple Myeloma, and provided supplemental information  -Discussed that the patient's CIDP needs to be considered in the treatment choice, as some available treatments could worsen his existing neuropathy, and my recommendation is to begin with Daratumumab and Dexamethasonse, and then to hopefully add Revlimid after initial tolerance is displayed -Continue Xgeva every 4 weeks -Advised that the pt return to care with his orthopedist for consideration of risk stratification of his hip and femur involvement, and determining need for prophylactic fixation -Advised that the pt limit his physical stressors, and limit bending, pushing, pulling and stairs to limit risk of further pathologic bone fractures  -Continue Tramadol for fracture related pain -Will begin Acyclovir and discussed the risk of developing Shingles  -Will send supportive medications to pharmacy -Will set the pt up for chemotherapy counseling -Will begin weekly Daratumumab and Dexamethasone -Will see the pt back 7 days after first treatment    -Chemotherapy counseling for Daratumumab/Revlimid/Dexamethasone ASAP -Start Daratumumab in 5-7 days -RTC with dr Irene Limbo with 2nd dose of Daratumumab for toxicity check -labs with each treatment -continue Marchelle Folks -orthopedics referral to [facility] Dancer for femur mets ? Need for prophylactic nailing   All of the patients questions were answered with apparent satisfaction. The patient knows to call the clinic with any problems, questions or concerns.  The total time spent in the appt was 40 minutes  and more than 50% was on counseling and direct patient cares.    Sullivan Lone MD MS AAHIVMS [facility] Hematology/Oncology [facility]  (Office):       [phone] (Work cell):  [phone] (Fax):           [phone]  04/23/2018 2:05 PM  I, Baldwin [place], am acting as a scribe for Dr. Sullivan Lone.   .I have reviewed the above documentation for accuracy and completeness, and I agree  with the above. Brunetta Genera MD

## 2018-04-24 ENCOUNTER — Inpatient Hospital Stay (HOSPITAL_BASED_OUTPATIENT_CLINIC_OR_DEPARTMENT_OTHER): Payer: Medicare Other | Admitting: Hematology

## 2018-04-24 ENCOUNTER — Telehealth: Payer: Self-pay | Admitting: Hematology

## 2018-04-24 VITALS — BP 176/94 | HR 73 | Temp 98.2°F | Resp 18 | Ht 70.0 in | Wt 189.6 lb

## 2018-04-24 DIAGNOSIS — I700 Atherosclerosis of aorta: Secondary | ICD-10-CM

## 2018-04-24 DIAGNOSIS — Z7982 Long term (current) use of aspirin: Secondary | ICD-10-CM

## 2018-04-24 DIAGNOSIS — Z5112 Encounter for antineoplastic immunotherapy: Secondary | ICD-10-CM | POA: Diagnosis not present

## 2018-04-24 DIAGNOSIS — S42031S Displaced fracture of lateral end of right clavicle, sequela: Secondary | ICD-10-CM

## 2018-04-24 DIAGNOSIS — C9000 Multiple myeloma not having achieved remission: Secondary | ICD-10-CM | POA: Diagnosis not present

## 2018-04-24 DIAGNOSIS — X58XXXS Exposure to other specified factors, sequela: Secondary | ICD-10-CM | POA: Diagnosis not present

## 2018-04-24 DIAGNOSIS — I2510 Atherosclerotic heart disease of native coronary artery without angina pectoris: Secondary | ICD-10-CM | POA: Diagnosis not present

## 2018-04-24 DIAGNOSIS — Z7189 Other specified counseling: Secondary | ICD-10-CM

## 2018-04-24 DIAGNOSIS — I5032 Chronic diastolic (congestive) heart failure: Secondary | ICD-10-CM | POA: Diagnosis not present

## 2018-04-24 DIAGNOSIS — Z803 Family history of malignant neoplasm of breast: Secondary | ICD-10-CM

## 2018-04-24 DIAGNOSIS — R634 Abnormal weight loss: Secondary | ICD-10-CM | POA: Diagnosis not present

## 2018-04-24 DIAGNOSIS — I110 Hypertensive heart disease with heart failure: Secondary | ICD-10-CM

## 2018-04-24 DIAGNOSIS — Z8041 Family history of malignant neoplasm of ovary: Secondary | ICD-10-CM

## 2018-04-24 DIAGNOSIS — G6181 Chronic inflammatory demyelinating polyneuritis: Secondary | ICD-10-CM

## 2018-04-24 DIAGNOSIS — Z79899 Other long term (current) drug therapy: Secondary | ICD-10-CM

## 2018-04-24 DIAGNOSIS — C7951 Secondary malignant neoplasm of bone: Secondary | ICD-10-CM | POA: Diagnosis not present

## 2018-04-24 DIAGNOSIS — Z950 Presence of cardiac pacemaker: Secondary | ICD-10-CM | POA: Diagnosis not present

## 2018-04-24 DIAGNOSIS — C801 Malignant (primary) neoplasm, unspecified: Secondary | ICD-10-CM | POA: Diagnosis not present

## 2018-04-24 DIAGNOSIS — Z87891 Personal history of nicotine dependence: Secondary | ICD-10-CM

## 2018-04-24 NOTE — Telephone Encounter (Signed)
Gave patient dtr avs report and appointments for December and January. Per 12/13 los patient to start dara 5-7 days and continue injection q4w. No care plan at this time. Spoke with [facility] and patient will have dara weekly for the 1st 8 weeks.  Scheduled patient for weekly dara and q4w injection will be given with infusion. Patient will see [facility] 12/30 with 2nd infusion due to [place] out of office - ok per [place]. Referral to ortho sent via [facility].

## 2018-04-29 ENCOUNTER — Encounter (HOSPITAL_COMMUNITY): Payer: Self-pay | Admitting: Hematology

## 2018-04-29 ENCOUNTER — Encounter: Payer: Self-pay | Admitting: Hematology

## 2018-04-29 DIAGNOSIS — C9000 Multiple myeloma not having achieved remission: Secondary | ICD-10-CM

## 2018-04-29 DIAGNOSIS — Z7189 Other specified counseling: Secondary | ICD-10-CM | POA: Insufficient documentation

## 2018-04-29 HISTORY — DX: Multiple myeloma not having achieved remission: C90.00

## 2018-04-29 MED ORDER — ONDANSETRON HCL 8 MG PO TABS: 8.0000 mg | ORAL_TABLET | Freq: Two times a day (BID) | ORAL | 1 refills | Status: DC | PRN

## 2018-04-29 MED ORDER — DEXAMETHASONE 4 MG PO TABS: ORAL_TABLET | ORAL | 4 refills | Status: DC

## 2018-04-29 MED ORDER — PROCHLORPERAZINE MALEATE 10 MG PO TABS: 10.0000 mg | ORAL_TABLET | Freq: Four times a day (QID) | ORAL | 1 refills | Status: DC | PRN

## 2018-04-29 MED ORDER — ACYCLOVIR 400 MG PO TABS: 400.0000 mg | ORAL_TABLET | Freq: Two times a day (BID) | ORAL | 11 refills | Status: DC

## 2018-04-29 NOTE — Progress Notes (Signed)
START ON PATHWAY REGIMEN - Multiple Myeloma and Other Plasma Cell Dyscrasias     Cycle 1 and 2: A cycle is every 28 days:     Lenalidomide      Dexamethasone      Daratumumab    Cycles 3 through 6: A cycle is every 28 days:     Lenalidomide      Dexamethasone      Daratumumab    Cycles 7 and beyond: A cycle is every 28 days:     Lenalidomide      Dexamethasone      Daratumumab   **Always confirm dose/schedule in your pharmacy ordering system**  Patient Characteristics: Newly Diagnosed, Transplant Ineligible or Refused, Unknown or Awaiting Test Results R-ISS Staging: II Disease Classification: Newly Diagnosed Is Patient Eligible for Transplant<= Transplant Ineligible or Refused Risk Status: Unknown Intent of Therapy: Non-Curative / Palliative Intent, Discussed with Patient

## 2018-04-29 NOTE — Telephone Encounter (Signed)
Spoke with Anderson Malta at [facility]. Patient scheduled to see Dr. Noemi Chapel 05/14/2018 @ 12:30 pm. Damaris Schooner with dtr re appointment date/time/location/phone.

## 2018-04-30 ENCOUNTER — Inpatient Hospital Stay: Payer: Medicare Other

## 2018-05-01 ENCOUNTER — Other Ambulatory Visit: Payer: Self-pay | Admitting: *Deleted

## 2018-05-01 ENCOUNTER — Telehealth: Payer: Self-pay | Admitting: *Deleted

## 2018-05-01 DIAGNOSIS — Z7189 Other specified counseling: Secondary | ICD-10-CM

## 2018-05-01 DIAGNOSIS — C9000 Multiple myeloma not having achieved remission: Secondary | ICD-10-CM

## 2018-05-01 MED ORDER — DEXAMETHASONE 4 MG PO TABS: ORAL_TABLET | ORAL | 4 refills | Status: DC

## 2018-05-01 MED ORDER — PROCHLORPERAZINE MALEATE 10 MG PO TABS: 10.0000 mg | ORAL_TABLET | Freq: Four times a day (QID) | ORAL | 1 refills | Status: DC | PRN

## 2018-05-01 MED ORDER — ACYCLOVIR 400 MG PO TABS: 400.0000 mg | ORAL_TABLET | Freq: Two times a day (BID) | ORAL | 11 refills | Status: DC

## 2018-05-01 MED ORDER — ONDANSETRON HCL 8 MG PO TABS: 8.0000 mg | ORAL_TABLET | Freq: Two times a day (BID) | ORAL | 1 refills | Status: DC | PRN

## 2018-05-01 NOTE — Telephone Encounter (Signed)
Faxed RXs for Zofran, Decadron, Acyclovir and Compazine to [facility] [phone]. Contacted Erasmo Downer [phone]  at [facility] to insure receipt. She states the RXs arrived.

## 2018-05-04 ENCOUNTER — Inpatient Hospital Stay: Payer: Medicare Other

## 2018-05-04 ENCOUNTER — Other Ambulatory Visit: Payer: Self-pay | Admitting: Hematology

## 2018-05-04 ENCOUNTER — Encounter: Payer: Self-pay | Admitting: Hematology

## 2018-05-04 VITALS — BP 133/86 | HR 95 | Temp 98.5°F | Resp 18

## 2018-05-04 DIAGNOSIS — S42031S Displaced fracture of lateral end of right clavicle, sequela: Secondary | ICD-10-CM | POA: Diagnosis not present

## 2018-05-04 DIAGNOSIS — C7951 Secondary malignant neoplasm of bone: Secondary | ICD-10-CM | POA: Diagnosis not present

## 2018-05-04 DIAGNOSIS — C801 Malignant (primary) neoplasm, unspecified: Secondary | ICD-10-CM | POA: Diagnosis not present

## 2018-05-04 DIAGNOSIS — Z7189 Other specified counseling: Secondary | ICD-10-CM

## 2018-05-04 DIAGNOSIS — C9000 Multiple myeloma not having achieved remission: Secondary | ICD-10-CM

## 2018-05-04 DIAGNOSIS — Z5112 Encounter for antineoplastic immunotherapy: Secondary | ICD-10-CM | POA: Diagnosis not present

## 2018-05-04 LAB — COMPREHENSIVE METABOLIC PANEL
ALT: 11 U/L (ref 0–44)
AST: 17 U/L (ref 15–41)
Albumin: 2.9 g/dL — ABNORMAL LOW (ref 3.5–5.0)
Alkaline Phosphatase: 243 U/L — ABNORMAL HIGH (ref 38–126)
Anion gap: 8 (ref 5–15)
BUN: 9 mg/dL (ref 8–23)
CO2: 23 mmol/L (ref 22–32)
Calcium: 8.5 mg/dL — ABNORMAL LOW (ref 8.9–10.3)
Chloride: 109 mmol/L (ref 98–111)
Creatinine, Ser: 0.95 mg/dL (ref 0.61–1.24)
GFR calc Af Amer: >60 mL/min (ref >60)
GFR calc non Af Amer: >60 mL/min (ref >60)
Glucose, Bld: 93 mg/dL (ref 70–99)
POTASSIUM: 3.9 mmol/L (ref 3.5–5.1)
Sodium: 140 mmol/L (ref 135–145)
TOTAL PROTEIN: 7.4 g/dL (ref 6.5–8.1)
Total Bilirubin: 0.4 mg/dL (ref 0.3–1.2)

## 2018-05-04 LAB — CBC WITH DIFFERENTIAL/PLATELET
Abs Immature Granulocytes: 0 10*3/uL (ref 0.00–0.07)
Basophils Absolute: 0.1 10*3/uL (ref 0.0–0.1)
Basophils Relative: 1 %
Eosinophils Absolute: 0.3 10*3/uL (ref 0.0–0.5)
Eosinophils Relative: 9 %
HCT: 33 % — ABNORMAL LOW (ref 39.0–52.0)
Hemoglobin: 10.5 g/dL — ABNORMAL LOW (ref 13.0–17.0)
Immature Granulocytes: 0 %
Lymphocytes Relative: 39 %
Lymphs Abs: 1.5 10*3/uL (ref 0.7–4.0)
MCH: 26.7 pg (ref 26.0–34.0)
MCHC: 31.8 g/dL (ref 30.0–36.0)
MCV: 84 fL (ref 80.0–100.0)
Monocytes Absolute: 0.3 10*3/uL (ref 0.1–1.0)
Monocytes Relative: 9 %
NEUTROS ABS: 1.6 10*3/uL — AB (ref 1.7–7.7)
Neutrophils Relative %: 42 %
Platelets: 203 10*3/uL (ref 150–400)
RBC: 3.93 MIL/uL — ABNORMAL LOW (ref 4.22–5.81)
RDW: 14.9 % (ref 11.5–15.5)
WBC: 3.7 10*3/uL — ABNORMAL LOW (ref 4.0–10.5)
nRBC: 0 % (ref 0.0–0.2)

## 2018-05-04 LAB — PRETREATMENT RBC PHENOTYPE

## 2018-05-04 LAB — TYPE AND SCREEN
ABO/RH(D): B POS
Antibody Screen: NEGATIVE

## 2018-05-04 LAB — ABO/RH: ABO/RH(D): B POS

## 2018-05-04 MED ORDER — SODIUM CHLORIDE 0.9 % IV SOLN
20.0000 mg | Freq: Once | INTRAVENOUS | Status: AC
  Administered 2018-05-04: 20 mg via INTRAVENOUS
  Filled 2018-05-04: qty 2

## 2018-05-04 MED ORDER — SODIUM CHLORIDE 0.9 % IV SOLN
16.3000 mg/kg | Freq: Once | INTRAVENOUS | Status: AC
  Administered 2018-05-04: 1400 mg via INTRAVENOUS
  Filled 2018-05-04: qty 60

## 2018-05-04 MED ORDER — DIPHENHYDRAMINE HCL 25 MG PO CAPS
ORAL_CAPSULE | ORAL | Status: AC
  Filled 2018-05-04: qty 2

## 2018-05-04 MED ORDER — ACETAMINOPHEN 325 MG PO TABS
ORAL_TABLET | ORAL | Status: AC
  Filled 2018-05-04: qty 2

## 2018-05-04 MED ORDER — DEXAMETHASONE SODIUM PHOSPHATE 10 MG/ML IJ SOLN
INTRAMUSCULAR | Status: AC
  Filled 2018-05-04: qty 1

## 2018-05-04 MED ORDER — MONTELUKAST SODIUM 10 MG PO TABS
ORAL_TABLET | ORAL | Status: AC
  Filled 2018-05-04: qty 1

## 2018-05-04 MED ORDER — DIPHENHYDRAMINE HCL 25 MG PO CAPS
50.0000 mg | ORAL_CAPSULE | Freq: Once | ORAL | Status: AC
  Administered 2018-05-04: 50 mg via ORAL

## 2018-05-04 MED ORDER — MONTELUKAST SODIUM 10 MG PO TABS
10.0000 mg | ORAL_TABLET | Freq: Once | ORAL | Status: AC
  Administered 2018-05-04: 10 mg via ORAL

## 2018-05-04 MED ORDER — SODIUM CHLORIDE 0.9 % IV SOLN
Freq: Once | INTRAVENOUS | Status: AC
  Administered 2018-05-04: 10:00:00 via INTRAVENOUS
  Filled 2018-05-04: qty 250

## 2018-05-04 MED ORDER — FAMOTIDINE IN NACL 20-0.9 MG/50ML-% IV SOLN
INTRAVENOUS | Status: AC
  Filled 2018-05-04: qty 50

## 2018-05-04 MED ORDER — ACETAMINOPHEN 325 MG PO TABS
650.0000 mg | ORAL_TABLET | Freq: Once | ORAL | Status: AC
  Administered 2018-05-04: 650 mg via ORAL

## 2018-05-04 MED ORDER — FAMOTIDINE IN NACL 20-0.9 MG/50ML-% IV SOLN
20.0000 mg | Freq: Once | INTRAVENOUS | Status: AC
  Administered 2018-05-04: 20 mg via INTRAVENOUS

## 2018-05-04 NOTE — Patient Instructions (Signed)
[place] Discharge Instructions for Patients Receiving Chemotherapy  Today you received the following chemotherapy agents Darzalex  To help prevent nausea and vomiting after your treatment, we encourage you to take your nausea medication as directed   If you develop nausea and vomiting that is not controlled by your nausea medication, call the clinic.   BELOW ARE SYMPTOMS THAT SHOULD BE REPORTED IMMEDIATELY:  *FEVER GREATER THAN 100.5 F  *CHILLS WITH OR WITHOUT FEVER  NAUSEA AND VOMITING THAT IS NOT CONTROLLED WITH YOUR NAUSEA MEDICATION  *UNUSUAL SHORTNESS OF BREATH  *UNUSUAL BRUISING OR BLEEDING  TENDERNESS IN MOUTH AND THROAT WITH OR WITHOUT PRESENCE OF ULCERS  *URINARY PROBLEMS  *BOWEL PROBLEMS  UNUSUAL RASH Items with * indicate a potential emergency and should be followed up as soon as possible.  Feel free to call the clinic should you have any questions or concerns. The clinic phone number is [phone].  Please show the [place] at check-in to the Emergency Department and triage nurse.    Daratumumab injection What is this medicine? DARATUMUMAB (dar a toom ue mab) is a monoclonal antibody. It is used to treat multiple myeloma. This medicine may be used for other purposes; ask your health care provider or pharmacist if you have questions. COMMON BRAND NAME(S): DARZALEX What should I tell my health care provider before I take this medicine? They need to know if you have any of these conditions: -infection (especially a virus infection such as chickenpox, herpes, or hepatitis B virus) -lung or breathing disease -an unusual or allergic reaction to daratumumab, other medicines, foods, dyes, or preservatives -pregnant or trying to get pregnant -breast-feeding How should I use this medicine? This medicine is for infusion into a vein. It is given by a health care professional in a hospital or clinic setting. Talk to your pediatrician  regarding the use of this medicine in children. Special care may be needed. Overdosage: If you think you have taken too much of this medicine contact a poison control center or emergency room at once. NOTE: This medicine is only for you. Do not share this medicine with others. What if I miss a dose? Keep appointments for follow-up doses as directed. It is important not to miss your dose. Call your doctor or health care professional if you are unable to keep an appointment. What may interact with this medicine? Interactions have not been studied. Give your health care provider a list of all the medicines, herbs, non-prescription drugs, or dietary supplements you use. Also tell them if you smoke, drink alcohol, or use illegal drugs. Some items may interact with your medicine. This list may not describe all possible interactions. Give your health care provider a list of all the medicines, herbs, non-prescription drugs, or dietary supplements you use. Also tell them if you smoke, drink alcohol, or use illegal drugs. Some items may interact with your medicine. What should I watch for while using this medicine? This drug may make you feel generally unwell. Report any side effects. Continue your course of treatment even though you feel ill unless your doctor tells you to stop. This medicine can cause serious allergic reactions. To reduce your risk you may need to take medicine before treatment with this medicine. Take your medicine as directed. This medicine can affect the results of blood tests to match your blood type. These changes can last for up to 6 months after the final dose. Your healthcare provider will do blood tests to match  your blood type before you start treatment. Tell all of your healthcare providers that you are being treated with this medicine before receiving a blood transfusion. This medicine can affect the results of some tests used to determine treatment response; extra tests may be  needed to evaluate response. Do not become pregnant while taking this medicine or for 3 months after stopping it. Women should inform their doctor if they wish to become pregnant or think they might be pregnant. There is a potential for serious side effects to an unborn child. Talk to your health care professional or pharmacist for more information. What side effects may I notice from receiving this medicine? Side effects that you should report to your doctor or health care professional as soon as possible: -allergic reactions like skin rash, itching or hives, swelling of the face, lips, or tongue -breathing problems -chills -cough -dizziness -feeling faint or lightheaded -headache -low blood counts - this medicine may decrease the number of white blood cells, red blood cells and platelets. You may be at increased risk for infections and bleeding. -nausea, vomiting -shortness of breath -signs of decreased platelets or bleeding - bruising, pinpoint red spots on the skin, black, tarry stools, blood in the urine -signs of decreased red blood cells - unusually weak or tired, feeling faint or lightheaded, falls -signs of infection - fever or chills, cough, sore throat, pain or difficulty passing urine -signs and symptoms of liver injury like dark yellow or brown urine; general ill feeling or flu-like symptoms; light-colored stools; loss of appetite; right upper belly pain; unusually weak or tired; yellowing of the eyes or skin Side effects that usually do not require medical attention (report to your doctor or health care professional if they continue or are bothersome): -back pain -constipation -loss of appetite -diarrhea -joint pain -muscle cramps -pain, tingling, numbness in the hands or feet -swelling of the ankles, feet, hands -tiredness -trouble sleeping This list may not describe all possible side effects. Call your doctor for medical advice about side effects. You may report side  effects to FDA at 1-800-FDA-1088. Where should I keep my medicine? Keep out of the reach of children. This drug is given in a hospital or clinic and will not be stored at home. NOTE: This sheet is a summary. It may not cover all possible information. If you have questions about this medicine, talk to your doctor, pharmacist, or health care provider.  2019 Elsevier/Gold Standard (2017-11-28 15:52:44)

## 2018-05-04 NOTE — Progress Notes (Signed)
Went to infusion to introduce myself as Arboriculturist and to offer available resources. Patient has 2 insurance therefore copay assistance should not be needed.  Discussed the one-time $700 Engineer, drilling. Gave my card for any additional financial questions or concerns.

## 2018-05-11 ENCOUNTER — Inpatient Hospital Stay (HOSPITAL_BASED_OUTPATIENT_CLINIC_OR_DEPARTMENT_OTHER): Payer: Medicare Other | Admitting: Adult Health

## 2018-05-11 ENCOUNTER — Inpatient Hospital Stay: Payer: Medicare Other

## 2018-05-11 ENCOUNTER — Encounter: Payer: Self-pay | Admitting: Adult Health

## 2018-05-11 VITALS — BP 132/86 | HR 74 | Temp 97.9°F | Resp 18

## 2018-05-11 VITALS — BP 153/94 | HR 70 | Temp 98.7°F | Resp 18 | Ht 70.0 in | Wt 187.7 lb

## 2018-05-11 DIAGNOSIS — I110 Hypertensive heart disease with heart failure: Secondary | ICD-10-CM

## 2018-05-11 DIAGNOSIS — C7951 Secondary malignant neoplasm of bone: Secondary | ICD-10-CM | POA: Diagnosis not present

## 2018-05-11 DIAGNOSIS — Z950 Presence of cardiac pacemaker: Secondary | ICD-10-CM

## 2018-05-11 DIAGNOSIS — I5032 Chronic diastolic (congestive) heart failure: Secondary | ICD-10-CM

## 2018-05-11 DIAGNOSIS — Z79899 Other long term (current) drug therapy: Secondary | ICD-10-CM | POA: Diagnosis not present

## 2018-05-11 DIAGNOSIS — C9000 Multiple myeloma not having achieved remission: Secondary | ICD-10-CM | POA: Diagnosis not present

## 2018-05-11 DIAGNOSIS — Z7982 Long term (current) use of aspirin: Secondary | ICD-10-CM

## 2018-05-11 DIAGNOSIS — C801 Malignant (primary) neoplasm, unspecified: Secondary | ICD-10-CM | POA: Diagnosis not present

## 2018-05-11 DIAGNOSIS — Z87891 Personal history of nicotine dependence: Secondary | ICD-10-CM

## 2018-05-11 DIAGNOSIS — Z7189 Other specified counseling: Secondary | ICD-10-CM

## 2018-05-11 DIAGNOSIS — S42031S Displaced fracture of lateral end of right clavicle, sequela: Secondary | ICD-10-CM

## 2018-05-11 DIAGNOSIS — Z5112 Encounter for antineoplastic immunotherapy: Secondary | ICD-10-CM | POA: Diagnosis not present

## 2018-05-11 DIAGNOSIS — X58XXXS Exposure to other specified factors, sequela: Secondary | ICD-10-CM | POA: Diagnosis not present

## 2018-05-11 DIAGNOSIS — M545 Low back pain: Secondary | ICD-10-CM | POA: Diagnosis not present

## 2018-05-11 LAB — CBC WITH DIFFERENTIAL/PLATELET
Abs Immature Granulocytes: 0.01 10*3/uL (ref 0.00–0.07)
Basophils Absolute: 0 10*3/uL (ref 0.0–0.1)
Basophils Relative: 0 %
Eosinophils Absolute: 0.2 10*3/uL (ref 0.0–0.5)
Eosinophils Relative: 4 %
HCT: 34.6 % — ABNORMAL LOW (ref 39.0–52.0)
Hemoglobin: 11.2 g/dL — ABNORMAL LOW (ref 13.0–17.0)
Immature Granulocytes: 0 %
Lymphocytes Relative: 27 %
Lymphs Abs: 1.2 10*3/uL (ref 0.7–4.0)
MCH: 27.1 pg (ref 26.0–34.0)
MCHC: 32.4 g/dL (ref 30.0–36.0)
MCV: 83.6 fL (ref 80.0–100.0)
MONO ABS: 0.4 10*3/uL (ref 0.1–1.0)
Monocytes Relative: 8 %
Neutro Abs: 2.7 10*3/uL (ref 1.7–7.7)
Neutrophils Relative %: 61 %
Platelets: 214 10*3/uL (ref 150–400)
RBC: 4.14 MIL/uL — ABNORMAL LOW (ref 4.22–5.81)
RDW: 15 % (ref 11.5–15.5)
WBC: 4.5 10*3/uL (ref 4.0–10.5)
nRBC: 0 % (ref 0.0–0.2)

## 2018-05-11 LAB — COMPREHENSIVE METABOLIC PANEL
ALK PHOS: 353 U/L — AB (ref 38–126)
ALT: 16 U/L (ref 0–44)
AST: 18 U/L (ref 15–41)
Albumin: 3.1 g/dL — ABNORMAL LOW (ref 3.5–5.0)
Anion gap: 8 (ref 5–15)
BUN: 14 mg/dL (ref 8–23)
CALCIUM: 8.6 mg/dL — AB (ref 8.9–10.3)
CO2: 25 mmol/L (ref 22–32)
CREATININE: 1.08 mg/dL (ref 0.61–1.24)
Chloride: 106 mmol/L (ref 98–111)
GFR calc Af Amer: >60 mL/min (ref >60)
GFR calc non Af Amer: >60 mL/min (ref >60)
Glucose, Bld: 95 mg/dL (ref 70–99)
Potassium: 4.6 mmol/L (ref 3.5–5.1)
Sodium: 139 mmol/L (ref 135–145)
Total Bilirubin: 0.7 mg/dL (ref 0.3–1.2)
Total Protein: 7 g/dL (ref 6.5–8.1)

## 2018-05-11 MED ORDER — SODIUM CHLORIDE 0.9 % IV SOLN
Freq: Once | INTRAVENOUS | Status: AC
  Administered 2018-05-11: 10:00:00 via INTRAVENOUS
  Filled 2018-05-11: qty 250

## 2018-05-11 MED ORDER — DIPHENHYDRAMINE HCL 25 MG PO CAPS
50.0000 mg | ORAL_CAPSULE | Freq: Once | ORAL | Status: AC
  Administered 2018-05-11: 50 mg via ORAL

## 2018-05-11 MED ORDER — SODIUM CHLORIDE 0.9 % IV SOLN
20.0000 mg | Freq: Once | INTRAVENOUS | Status: AC
  Administered 2018-05-11: 20 mg via INTRAVENOUS
  Filled 2018-05-11: qty 2

## 2018-05-11 MED ORDER — MONTELUKAST SODIUM 10 MG PO TABS
10.0000 mg | ORAL_TABLET | Freq: Once | ORAL | Status: AC
  Administered 2018-05-11: 10 mg via ORAL

## 2018-05-11 MED ORDER — FAMOTIDINE IN NACL 20-0.9 MG/50ML-% IV SOLN
20.0000 mg | Freq: Once | INTRAVENOUS | Status: AC
  Administered 2018-05-11: 20 mg via INTRAVENOUS

## 2018-05-11 MED ORDER — FAMOTIDINE IN NACL 20-0.9 MG/50ML-% IV SOLN
INTRAVENOUS | Status: AC
  Filled 2018-05-11: qty 50

## 2018-05-11 MED ORDER — MONTELUKAST SODIUM 10 MG PO TABS
ORAL_TABLET | ORAL | Status: AC
  Filled 2018-05-11: qty 1

## 2018-05-11 MED ORDER — SODIUM CHLORIDE 0.9 % IV SOLN
16.2000 mg/kg | Freq: Once | INTRAVENOUS | Status: AC
  Administered 2018-05-11: 1400 mg via INTRAVENOUS
  Filled 2018-05-11: qty 60

## 2018-05-11 MED ORDER — ACETAMINOPHEN 325 MG PO TABS
650.0000 mg | ORAL_TABLET | Freq: Once | ORAL | Status: AC
  Administered 2018-05-11: 650 mg via ORAL

## 2018-05-11 MED ORDER — ACETAMINOPHEN 325 MG PO TABS
ORAL_TABLET | ORAL | Status: AC
  Filled 2018-05-11: qty 2

## 2018-05-11 MED ORDER — DIPHENHYDRAMINE HCL 25 MG PO CAPS
ORAL_CAPSULE | ORAL | Status: AC
  Filled 2018-05-11: qty 2

## 2018-05-11 NOTE — Patient Instructions (Signed)
Lenalidomide Oral Capsules What is this medicine? LENALIDOMIDE (len a LID oh mide) is a chemotherapy drug that targets specific proteins within cancer cells and stops the cancer cell from growing. It is used to treat multiple myeloma, certain types of lymphoma, and some myelodysplastic syndromes that cause severe anemia requiring blood transfusions. This medicine may be used for other purposes; ask your health care provider or pharmacist if you have questions. COMMON BRAND NAME(S): Revlimid What should I tell my health care provider before I take this medicine? They need to know if you have any of these conditions: -blood clots in the legs or the lungs -high blood pressure -high cholesterol -infection -irregular monthly periods or menstrual cycles -kidney disease -liver disease -smoke tobacco -thyroid disease -an unusual or allergic reaction to lenalidomide, thalidomide, other medicines, foods, dyes, or preservatives -pregnant or trying to get pregnant -breast-feeding How should I use this medicine? Take this medicine by mouth with a glass of water. Follow the directions on the prescription label. Do not cut, crush, or chew this medicine. Take your medicine at regular intervals. Do not take it more often than directed. Do not stop taking except on your doctor's advice. A MedGuide will be given with each prescription and refill. Read this guide carefully each time. The MedGuide may change frequently. Talk to your pediatrician regarding the use of this medicine in children. Special care may be needed. Overdosage: If you think you have taken too much of this medicine contact a poison control center or emergency room at once. NOTE: This medicine is only for you. Do not share this medicine with others. What if I miss a dose? If you miss a dose, take it as soon as you can. If your next dose is to be taken in less than 12 hours, then do not take the missed dose. Take the next dose at your regular  time. Do not take double or extra doses. What may interact with this medicine? This medicine may interact with the following medications: -digoxin -medicines that increase the risk of thrombosis like estrogens or erythropoietic agents (e.g., epoetin alfa and darbepoetin alfa) -warfarin This list may not describe all possible interactions. Give your health care provider a list of all the medicines, herbs, non-prescription drugs, or dietary supplements you use. Also tell them if you smoke, drink alcohol, or use illegal drugs. Some items may interact with your medicine. What should I watch for while using this medicine? You may need blood work done while you are taking this medicine. This medicine is available only through a special program. Doctors, pharmacies, and patients must meet all of the conditions of the program. Your health care provider will help you get signed up with the program if you need this medicine. Through the program you will only receive up to a 28 day supply of the medicine at one time. You will need a new prescription for each refill. This medicine can cause birth defects. Do not get pregnant while taking this drug. Females with child-bearing potential will need to have 2 negative pregnancy tests before starting this medicine. Pregnancy testing must be done every 2 to 4 weeks as directed while taking this medicine. Use 2 reliable forms of birth control together while you are taking this medicine and for 4 weeks after you stop taking this medicine. If you think that you might be pregnant talk to your doctor right away. Do not breast-feed an infant while taking this medicine. Men must use a latex condom during  sexual contact with a woman while taking this medicine and for 4 weeks after you stop taking this medicine. A latex condom is needed even if you have had a vasectomy. Contact your doctor right away if your partner becomes pregnant. Do not donate sperm while taking this medicine  and for 4 weeks after you stop taking this medicine. Do not give blood while taking the medicine and for 4 weeks after completion of treatment to avoid exposing pregnant women to the medicine through the donated blood. Talk to your doctor about your risk of cancer. You may be more at risk for certain types of cancers if you take this medicine. What side effects may I notice from receiving this medicine? Side effects that you should report to your doctor or health care professional as soon as possible: -allergic reactions like skin rash, itching or hives, swelling of the face, lips, or tongue -breathing problems -chest pain or tightness -fast, irregular heartbeat -fever with rash, swollen lymph nodes, or swelling of the face -low blood counts - this medicine may decrease the number of white blood cells, red blood cells and platelets. You may be at increased risk for infections and bleeding. -seizures -signs and symptoms of bleeding such as bloody or black, tarry stools; red or dark-brown urine; spitting up blood or brown material that looks like coffee grounds; red spots on the skin; unusual bruising or bleeding from the eye, gums, or nose -signs and symptoms of a blood clot such as breathing problems; changes in vision; chest pain; severe, sudden headache; pain, swelling, warmth in the leg; trouble speaking; sudden numbness or weakness of the face, arm or leg -signs and symptoms of liver injury like dark yellow or brown urine; general ill feeling or flu-like symptoms; light-colored stools; loss of appetite; nausea; right upper belly pain; unusually weak or tired; yellowing of the eyes or skin -signs and symptoms of a stroke like changes in vision; confusion; trouble speaking or understanding; severe headaches; sudden numbness or weakness of the face, arm or leg; trouble walking; dizziness; loss of balance or coordination -sweating -vomiting Side effects that usually do not require medical attention  (report to your doctor or health care professional if they continue or are bothersome): -constipation -cough -diarrhea -joint pain -muscle cramps -swelling of the arms, legs, or skin -tiredness -trouble sleeping This list may not describe all possible side effects. Call your doctor for medical advice about side effects. You may report side effects to FDA at 1-800-FDA-1088. Where should I keep my medicine? Keep out of the reach of children. Store at room temperature between 15 and 30 degrees C (59 and 86 degrees F). Throw away any unused medicine after the expiration date. NOTE: This sheet is a summary. It may not cover all possible information. If you have questions about this medicine, talk to your doctor, pharmacist, or health care provider.  2019 Elsevier/Gold Standard (2017-10-13 15:00:38)

## 2018-05-11 NOTE — Assessment & Plan Note (Signed)
Jonathon [name] is an 82 year old man with newly diagnosed multiple myeloma here today for evaluation prior to receiving his treatment.    1. Multiple Myeloma: He is tolerating treatment with Daratumumab well.  His CBC is stable today, CMET pending.  He will proceed with treatment today.  He is taking the Dexamethasone on treatment day and is tolerating this well with no adverse effects.  We briefly reviewed the potential addition of Lenalidomide and the fact that Dr. Irene Limbo will discuss this further with them at his next appointment.  This will be in 2 weeks.  I gave him reading information about Lenalidomide today.  2. Pathologic fractures/pain:  He has tramadol PRN, and has needed this minimally.  His pain is improving.    Jonathon [name] will continue to return for weekly Daratumumab and will see Dr. Irene Limbo in 2 weeks.

## 2018-05-11 NOTE — Progress Notes (Signed)
[facility] Follow up:    Jonathon Ruddy, MD [address] [place] [address]   DIAGNOSIS: Cancer Staging No matching staging information was found for the patient.  SUMMARY OF ONCOLOGIC HISTORY:   Multiple myeloma ([facility])   04/29/2018 Initial Diagnosis    Multiple myeloma ([place])    05/04/2018 -  Chemotherapy    The patient had daratumumab (DARZALEX) 1,400 mg in sodium chloride 0.9 % 930 mL (1.4 mg/mL) chemo infusion, 16.3 mg/kg = 1,380 mg, Intravenous, Once, 1 of 1 cycle Administration: 1,400 mg (05/04/2018) daratumumab (DARZALEX) 1,380 mg in sodium chloride 0.9 % 431 mL (2.76 mg/mL) chemo infusion, 16 mg/kg = 1,380 mg, Intravenous, Once, 1 of 10 cycles  for chemotherapy treatment.      CURRENT THERAPY: Weekly Daratumumab and Dexamethasone, Xgeva every 4 weeks  INTERVAL HISTORY: Jonathon [name] 82 y.o. male returns for evaluation accompanied by his daughter Cybil.  He is doing well since starting treatment.  He lives in assisted living.  [place] [facility].  He is pretty independent, using the services for dining mostly.  He has a good appetite and that hasn't changed.  He hasn't noted any changes or side effects from the treatment.  He takes 38m Dexamethasone on Daratumumab treatment days.  He does not note any issues such as sleeplessness or stomach upset from taking this.    Pain: in lower back, occ radiating.  His right shoulder/clavicle is improving.  ROM is getting better.  He takes Tramadol for his pain.  He says that he hasn't requested it in a while.  He says that he hasn't felt a need to request any pain medication.   Patient Active Problem List   Diagnosis Date Noted  . Counseling regarding advance care planning and goals of care 04/29/2018  . Multiple myeloma ([place] 04/29/2018  . Bone metastases ([facility] 04/14/2018  . Hypercalcemia 04/14/2018  . Lytic bone lesions on xray 03/29/2018  . Displaced fracture of lateral end of right  clavicle with routine healing 03/29/2018  . Fall 09/05/2017  . Sepsis secondary to UTI ([facility] 09/05/2017  . Urinary retention 09/05/2017  . AKI (acute kidney injury) ([facility] 09/05/2017  . CAD (coronary artery disease) 09/05/2017  . Superficial vein thrombosis 08/19/2017  . Altered mental status   . Metabolic encephalopathy [id number] . Dementia ([facility] 08/04/2017  . Seizure ([facility] 08/01/2017  . Thiamine deficiency 05/27/2017  . B12 deficiency 04/30/2017  . Alcoholism ([facility] 01/28/2017  . Acute encephalopathy 12/28/2016  . Hepatic steatosis 06/05/2016  . Physical deconditioning 06/05/2016  . Chronic diastolic congestive heart failure ([facility] 06/05/2016  . Orthostatic syncope   . Elevated liver function tests   . LOC (loss of consciousness) ([facility] 08/10/2014  . CIDP (chronic inflammatory demyelinating polyneuropathy) ([facility] 08/08/2014  . Sustained SVT ([facility] 07/27/2014  . 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 Abnormal LFTs 08/06/2013  . Iron deficiency anemia 04/19/2013  . Muscle weakness 01/13/2013  . External hemorrhoids 03/23/2011  . Symptomatic bradycardia 10/25/2008  . 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 PACEMAKER, PERMANENT 10/25/2008  . Essential hypertension 11/21/2006  . BPH (benign prostatic hyperplasia) 11/21/2006  . History of colonic polyps 11/21/2006    is allergic to lexapro [escitalopram oxalate].  MEDICAL HISTORY: Past Medical History:  Diagnosis Date  . Asthma   . BENIGN PROSTATIC HYPERTROPHY, HX OF 10/25/2008  . Bone metastases ([facility] 04/14/2018  . BRADYCARDIA 2005  . Chronic diastolic congestive heart failure ([facility] 06/05/2016  . CIDP (chronic inflammatory demyelinating  polyneuropathy) ([place])   . HYPERTENSION 11/21/2006  . Iron deficiency anemia, unspecified 04/19/2013  . Multiple myeloma ([place]) 04/29/2018  . 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 PACEMAKER, PERMANENT 2005   Gen change 2014 Medtronic Adaptic L dual-chamber pacemaker, serial [id number]   .  SVT (supraventricular tachycardia) ([place])   . TOBACCO ABUSE 10/25/2008   Quit 2012    SURGICAL HISTORY: Past Surgical History:  Procedure Laterality Date  . COLONOSCOPY  [id number]  . PACEMAKER INSERTION  2005  . PERMANENT PACEMAKER GENERATOR CHANGE N/A 06/19/2012   Procedure: PERMANENT PACEMAKER GENERATOR CHANGE;  Surgeon: [name], MD; Medtronic Adaptic L dual-chamber pacemaker, serial #[id number]    . SKIN GRAFT Right 1962   wrist    SOCIAL HISTORY: Social History   Socioeconomic History  . Marital status: Divorced    Spouse name: Not on file  . Number of children: 4  . Years of education: Not on file  . Highest education level: Not on file  Occupational History  . Occupation: ENVIROMENTAL Location manager: [employer]  . Occupation: retired  Scientific laboratory technician  . Financial resource strain: Not on file  . Food insecurity:    Worry: Not on file    Inability: Not on file  . Transportation needs:    Medical: Not on file    Non-medical: Not on file  Tobacco Use  . Smoking status: Former Smoker    Packs/day: 0.25    Years: 46.00    Pack years: 11.50    Types: Cigarettes    Start date: 03/16/1965    Last attempt to quit: 06/15/2010    Years since quitting: 7.9  . Smokeless tobacco: Never Used  . Tobacco comment: quit 3 years ago  Substance and Sexual Activity  . Alcohol use: No    Alcohol/week: 0.0 standard drinks    Comment: 4 beers per day  . Drug use: No  . Sexual activity: Not on file  Lifestyle  . Physical activity:    Days per week: Not on file    Minutes per session: Not on file  . Stress: Not on file  Relationships  . Social connections:    Talks on phone: Not on file    Gets together: Not on file    Attends religious service: Not on file    Active member of club or organization: Not on file    Attends meetings of clubs or organizations: Not on file    Relationship status: Not on file  . Intimate partner violence:    Fear of current or  ex partner: Not on file    Emotionally abused: Not on file    Physically abused: Not on file    Forced sexual activity: Not on file  Other Topics Concern  . Not on file  Social History Narrative   Has relocated from [place] in 2007. 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    FAMILY HISTORY: Family History  Problem Relation Age of Onset  . Heart attack Father        Died, 68  . Kidney disease Father   . Parkinson's disease Mother        Died, 17  . Breast cancer Sister        Living, 66  . Diabetes type II Brother        Living, 17  . Breast cancer Sister        Living, 18  .  Diabetes Daughter        Living, 65  . Diabetes Son        Living, 62  . Ovarian cancer Daughter   . Colon cancer Neg Hx   . Rectal cancer Neg Hx   . Stomach cancer Neg Hx     Review of Systems  Constitutional: Negative for appetite change, chills, fatigue, fever and unexpected weight change.  HENT:   Negative for hearing loss, lump/mass, sore throat and trouble swallowing.   Eyes: Negative for eye problems and icterus.  Respiratory: Negative for chest tightness and shortness of breath.   Cardiovascular: Negative for chest pain, leg swelling and palpitations.  Gastrointestinal: Negative for abdominal distention, abdominal pain, constipation, diarrhea, nausea and vomiting.  Skin: Negative for itching and rash.  Neurological: Positive for numbness (Has CIPD). Negative for dizziness and extremity weakness.  Hematological: Negative for adenopathy. Does not bruise/bleed easily.  Psychiatric/Behavioral: Negative for depression. The patient is not nervous/anxious.       PHYSICAL EXAMINATION  ECOG PERFORMANCE STATUS: 2 - Symptomatic, <50% confined to bed  Vitals:   05/11/18 0842  BP: (!) 153/94  Pulse: 70  Resp: 18  Temp: 98.7 F (37.1 C)  SpO2: 100%    Physical Exam Constitutional:      Appearance: Normal appearance.     Comments: In wheelchair   HENT:     Head:  Normocephalic and atraumatic.     Mouth/Throat:     Mouth: Mucous membranes are moist.     Pharynx: Oropharynx is clear. No oropharyngeal exudate.  Eyes:     General: No scleral icterus.    Pupils: Pupils are equal, round, and reactive to light.  Neck:     Musculoskeletal: Neck supple.  Cardiovascular:     Rate and Rhythm: Normal rate and regular rhythm.     Pulses: Normal pulses.     Heart sounds: Normal heart sounds.  Pulmonary:     Effort: Pulmonary effort is normal.     Breath sounds: Normal breath sounds.  Abdominal:     General: Abdomen is flat. Bowel sounds are normal. There is no distension.     Palpations: Abdomen is soft.     Tenderness: There is no abdominal tenderness.  Musculoskeletal:        General: No swelling.  Lymphadenopathy:     Cervical: No cervical adenopathy.  Skin:    General: Skin is warm and dry.     Capillary Refill: Capillary refill takes less than 2 seconds.     Findings: No rash.  Neurological:     General: No focal deficit present.     Mental Status: He is alert.  Psychiatric:        Mood and Affect: Mood normal.        Behavior: Behavior normal.     LABORATORY DATA:  CBC    Component Value Date/Time   WBC 4.5 05/11/2018 0827   RBC 4.14 (L) 05/11/2018 0827   HGB 11.2 (L) 05/11/2018 0827   HGB 9.7 (L) 09/15/2014 0810   HCT 34.6 (L) 05/11/2018 0827   HCT 28.8 (L) 09/15/2014 0810   PLT 214 05/11/2018 0827   PLT 179 09/15/2014 0810   MCV 83.6 05/11/2018 0827   MCV 80 (L) 09/15/2014 0810   MCH 27.1 05/11/2018 0827   MCHC 32.4 05/11/2018 0827   RDW 15.0 05/11/2018 0827   RDW 14.1 09/15/2014 0810   LYMPHSABS 1.2 05/11/2018 0827   LYMPHSABS 1.1 09/15/2014 0810  MONOABS 0.4 05/11/2018 0827   EOSABS 0.2 05/11/2018 0827   EOSABS 0.2 09/15/2014 0810   BASOSABS 0.0 05/11/2018 0827   BASOSABS 0.1 09/15/2014 0810    CMP     Component Value Date/Time   NA 139 05/11/2018 0827   NA 138 09/09/2017   NA 142 05/23/2014 1047   K 4.6  05/11/2018 0827   K 3.7 05/23/2014 1047   CL 106 05/11/2018 0827   CL 104 05/23/2014 1047   CO2 25 05/11/2018 0827   CO2 28 05/23/2014 1047   GLUCOSE 95 05/11/2018 0827   GLUCOSE 90 05/23/2014 1047   BUN 14 05/11/2018 0827   BUN 12 09/09/2017   BUN 10 05/23/2014 1047   CREATININE 1.08 05/11/2018 0827   CREATININE 1.59 (H) 04/14/2018 1159   CREATININE 1.2 05/23/2014 1047   CALCIUM 8.6 (L) 05/11/2018 0827   CALCIUM 10.9 (H) 04/14/2018 1159   PROT 7.0 05/11/2018 0827   PROT 7.8 05/23/2014 1047   ALBUMIN 3.1 (L) 05/11/2018 0827   ALBUMIN 3.1 (L) 05/23/2014 1047   AST 18 05/11/2018 0827   AST 19 04/14/2018 1159   ALT 16 05/11/2018 0827   ALT 11 04/14/2018 1159   ALT 180 (H) 05/23/2014 1047   ALKPHOS 353 (H) 05/11/2018 0827   ALKPHOS 77 05/23/2014 1047   BILITOT 0.7 05/11/2018 0827   BILITOT 0.5 04/14/2018 1159   GFRNONAA >60 05/11/2018 0827   GFRNONAA 40 (L) 04/14/2018 1159   GFRAA >60 05/11/2018 0827   GFRAA 47 (L) 04/14/2018 1159        ASSESSMENT and PLAN:   Multiple myeloma (H[facility]) Aidenn is an 82 year old man with newly diagnosed multiple myeloma here today for evaluation prior to receiving his treatment.    1. Multiple Myeloma: He is tolerating treatment with Daratumumab well.  His CBC is stable today, CMET pending.  He will proceed with treatment today.  He is taking the Dexamethasone on treatment day and is tolerating this well with no adverse effects.  We briefly reviewed the potential addition of Lenalidomide and the fact that Dr. Irene Limbo will discuss this further with them at his next appointment.  This will be in 2 weeks.  I gave him reading information about Lenalidomide today.  2. Pathologic fractures/pain:  He has tramadol PRN, and has needed this minimally.  His pain is improving.    Shadow will continue to return for weekly Daratumumab and will see Dr. Irene Limbo in 2 weeks.     All questions were answered. The patient knows to call the clinic with any problems,  questions or concerns. We can certainly see the patient much sooner if necessary.  A total of (30) minutes of face-to-face time was spent with this patient with greater than 50% of that time in counseling and care-coordination.  This note was electronically signed. Scot Dock, NP 05/11/2018

## 2018-05-11 NOTE — Patient Instructions (Signed)
[facility] Discharge Instructions for Patients Receiving Chemotherapy  Today you received the following chemotherapy agents :  Daratumumab.  To help prevent nausea and vomiting after your treatment, we encourage you to take your nausea medication as prescribed.   If you develop nausea and vomiting that is not controlled by your nausea medication, call the clinic.   BELOW ARE SYMPTOMS THAT SHOULD BE REPORTED IMMEDIATELY:  *FEVER GREATER THAN 100.5 F  *CHILLS WITH OR WITHOUT FEVER  NAUSEA AND VOMITING THAT IS NOT CONTROLLED WITH YOUR NAUSEA MEDICATION  *UNUSUAL SHORTNESS OF BREATH  *UNUSUAL BRUISING OR BLEEDING  TENDERNESS IN MOUTH AND THROAT WITH OR WITHOUT PRESENCE OF ULCERS  *URINARY PROBLEMS  *BOWEL PROBLEMS  UNUSUAL RASH Items with * indicate a potential emergency and should be followed up as soon as possible.  Feel free to call the clinic should you have any questions or concerns. The clinic phone number is [phone].  Please show the CHEMO ALERT CARD at check-in to the Emergency Department and triage nurse.   

## 2018-05-18 ENCOUNTER — Inpatient Hospital Stay: Payer: Medicare Other

## 2018-05-18 ENCOUNTER — Inpatient Hospital Stay: Payer: Medicare Other | Attending: Hematology

## 2018-05-18 ENCOUNTER — Telehealth: Payer: Self-pay | Admitting: Hematology

## 2018-05-18 VITALS — BP 141/88 | HR 66 | Temp 97.9°F | Resp 17 | Ht 70.0 in | Wt 190.4 lb

## 2018-05-18 DIAGNOSIS — N323 Diverticulum of bladder: Secondary | ICD-10-CM | POA: Diagnosis not present

## 2018-05-18 DIAGNOSIS — N62 Hypertrophy of breast: Secondary | ICD-10-CM | POA: Insufficient documentation

## 2018-05-18 DIAGNOSIS — R5383 Other fatigue: Secondary | ICD-10-CM | POA: Diagnosis not present

## 2018-05-18 DIAGNOSIS — M25511 Pain in right shoulder: Secondary | ICD-10-CM | POA: Diagnosis not present

## 2018-05-18 DIAGNOSIS — I2510 Atherosclerotic heart disease of native coronary artery without angina pectoris: Secondary | ICD-10-CM | POA: Diagnosis not present

## 2018-05-18 DIAGNOSIS — G6181 Chronic inflammatory demyelinating polyneuritis: Secondary | ICD-10-CM | POA: Diagnosis not present

## 2018-05-18 DIAGNOSIS — Z7982 Long term (current) use of aspirin: Secondary | ICD-10-CM | POA: Diagnosis not present

## 2018-05-18 DIAGNOSIS — C7951 Secondary malignant neoplasm of bone: Secondary | ICD-10-CM

## 2018-05-18 DIAGNOSIS — Z950 Presence of cardiac pacemaker: Secondary | ICD-10-CM | POA: Diagnosis not present

## 2018-05-18 DIAGNOSIS — Z79899 Other long term (current) drug therapy: Secondary | ICD-10-CM | POA: Insufficient documentation

## 2018-05-18 DIAGNOSIS — M545 Low back pain: Secondary | ICD-10-CM | POA: Diagnosis not present

## 2018-05-18 DIAGNOSIS — I5032 Chronic diastolic (congestive) heart failure: Secondary | ICD-10-CM | POA: Diagnosis not present

## 2018-05-18 DIAGNOSIS — Z7189 Other specified counseling: Secondary | ICD-10-CM

## 2018-05-18 DIAGNOSIS — M899 Disorder of bone, unspecified: Secondary | ICD-10-CM | POA: Diagnosis not present

## 2018-05-18 DIAGNOSIS — R0789 Other chest pain: Secondary | ICD-10-CM | POA: Diagnosis not present

## 2018-05-18 DIAGNOSIS — I110 Hypertensive heart disease with heart failure: Secondary | ICD-10-CM | POA: Diagnosis not present

## 2018-05-18 DIAGNOSIS — R531 Weakness: Secondary | ICD-10-CM | POA: Diagnosis not present

## 2018-05-18 DIAGNOSIS — R634 Abnormal weight loss: Secondary | ICD-10-CM | POA: Insufficient documentation

## 2018-05-18 DIAGNOSIS — Z87891 Personal history of nicotine dependence: Secondary | ICD-10-CM | POA: Diagnosis not present

## 2018-05-18 DIAGNOSIS — C9000 Multiple myeloma not having achieved remission: Secondary | ICD-10-CM

## 2018-05-18 DIAGNOSIS — Z5112 Encounter for antineoplastic immunotherapy: Secondary | ICD-10-CM | POA: Insufficient documentation

## 2018-05-18 DIAGNOSIS — D649 Anemia, unspecified: Secondary | ICD-10-CM | POA: Insufficient documentation

## 2018-05-18 DIAGNOSIS — N400 Enlarged prostate without lower urinary tract symptoms: Secondary | ICD-10-CM | POA: Diagnosis not present

## 2018-05-18 LAB — COMPREHENSIVE METABOLIC PANEL
ALT: 16 U/L (ref 0–44)
AST: 16 U/L (ref 15–41)
Albumin: 3.3 g/dL — ABNORMAL LOW (ref 3.5–5.0)
Alkaline Phosphatase: 403 U/L — ABNORMAL HIGH (ref 38–126)
Anion gap: 9 (ref 5–15)
BUN: 13 mg/dL (ref 8–23)
CHLORIDE: 108 mmol/L (ref 98–111)
CO2: 23 mmol/L (ref 22–32)
Calcium: 8.4 mg/dL — ABNORMAL LOW (ref 8.9–10.3)
Creatinine, Ser: 1 mg/dL (ref 0.61–1.24)
GFR calc Af Amer: >60 mL/min (ref >60)
GFR calc non Af Amer: >60 mL/min (ref >60)
Glucose, Bld: 87 mg/dL (ref 70–99)
Potassium: 4.4 mmol/L (ref 3.5–5.1)
Sodium: 140 mmol/L (ref 135–145)
Total Bilirubin: 0.6 mg/dL (ref 0.3–1.2)
Total Protein: 7.1 g/dL (ref 6.5–8.1)

## 2018-05-18 LAB — CBC WITH DIFFERENTIAL/PLATELET
Abs Immature Granulocytes: 0.01 10*3/uL (ref 0.00–0.07)
Basophils Absolute: 0 10*3/uL (ref 0.0–0.1)
Basophils Relative: 1 %
Eosinophils Absolute: 0.2 10*3/uL (ref 0.0–0.5)
Eosinophils Relative: 5 %
HEMATOCRIT: 37.1 % — AB (ref 39.0–52.0)
Hemoglobin: 11.7 g/dL — ABNORMAL LOW (ref 13.0–17.0)
Immature Granulocytes: 0 %
Lymphocytes Relative: 34 %
Lymphs Abs: 1.4 10*3/uL (ref 0.7–4.0)
MCH: 26.7 pg (ref 26.0–34.0)
MCHC: 31.5 g/dL (ref 30.0–36.0)
MCV: 84.5 fL (ref 80.0–100.0)
Monocytes Absolute: 0.5 10*3/uL (ref 0.1–1.0)
Monocytes Relative: 11 %
Neutro Abs: 2.1 10*3/uL (ref 1.7–7.7)
Neutrophils Relative %: 49 %
Platelets: 190 10*3/uL (ref 150–400)
RBC: 4.39 MIL/uL (ref 4.22–5.81)
RDW: 15.6 % — ABNORMAL HIGH (ref 11.5–15.5)
WBC: 4.2 10*3/uL (ref 4.0–10.5)
nRBC: 0 % (ref 0.0–0.2)

## 2018-05-18 MED ORDER — DIPHENHYDRAMINE HCL 25 MG PO CAPS
50.0000 mg | ORAL_CAPSULE | Freq: Once | ORAL | Status: AC
  Administered 2018-05-18: 50 mg via ORAL

## 2018-05-18 MED ORDER — SODIUM CHLORIDE 0.9 % IV SOLN
Freq: Once | INTRAVENOUS | Status: AC
  Administered 2018-05-18: 10:00:00 via INTRAVENOUS
  Filled 2018-05-18: qty 250

## 2018-05-18 MED ORDER — MONTELUKAST SODIUM 10 MG PO TABS
ORAL_TABLET | ORAL | Status: AC
  Filled 2018-05-18: qty 1

## 2018-05-18 MED ORDER — ACETAMINOPHEN 325 MG PO TABS
ORAL_TABLET | ORAL | Status: AC
  Filled 2018-05-18: qty 1

## 2018-05-18 MED ORDER — FAMOTIDINE IN NACL 20-0.9 MG/50ML-% IV SOLN
INTRAVENOUS | Status: AC
  Filled 2018-05-18: qty 50

## 2018-05-18 MED ORDER — FAMOTIDINE IN NACL 20-0.9 MG/50ML-% IV SOLN
20.0000 mg | Freq: Once | INTRAVENOUS | Status: AC
  Administered 2018-05-18: 20 mg via INTRAVENOUS

## 2018-05-18 MED ORDER — MONTELUKAST SODIUM 10 MG PO TABS
10.0000 mg | ORAL_TABLET | Freq: Once | ORAL | Status: AC
  Administered 2018-05-18: 10 mg via ORAL

## 2018-05-18 MED ORDER — ACETAMINOPHEN 325 MG PO TABS
650.0000 mg | ORAL_TABLET | Freq: Once | ORAL | Status: AC
  Administered 2018-05-18: 650 mg via ORAL

## 2018-05-18 MED ORDER — SODIUM CHLORIDE 0.9 % IV SOLN
20.0000 mg | Freq: Once | INTRAVENOUS | Status: AC
  Administered 2018-05-18: 20 mg via INTRAVENOUS
  Filled 2018-05-18: qty 2

## 2018-05-18 MED ORDER — DENOSUMAB 120 MG/1.7ML ~~LOC~~ SOLN
SUBCUTANEOUS | Status: AC
  Filled 2018-05-18: qty 1.7

## 2018-05-18 MED ORDER — DIPHENHYDRAMINE HCL 25 MG PO CAPS
ORAL_CAPSULE | ORAL | Status: AC
  Filled 2018-05-18: qty 2

## 2018-05-18 MED ORDER — DENOSUMAB 120 MG/1.7ML ~~LOC~~ SOLN
120.0000 mg | Freq: Once | SUBCUTANEOUS | Status: AC
  Administered 2018-05-18: 120 mg via SUBCUTANEOUS

## 2018-05-18 MED ORDER — SODIUM CHLORIDE 0.9 % IV SOLN
16.2000 mg/kg | Freq: Once | INTRAVENOUS | Status: AC
  Administered 2018-05-18: 1400 mg via INTRAVENOUS
  Filled 2018-05-18: qty 60

## 2018-05-18 NOTE — Patient Instructions (Addendum)
[facility] Discharge Instructions for Patients Receiving Chemotherapy  Today you received the following chemotherapy agents: Daratumumab (Darzalex)  To help prevent nausea and vomiting after your treatment, we encourage you to take your nausea medication as directed.    If you develop nausea and vomiting that is not controlled by your nausea medication, call the clinic.   BELOW ARE SYMPTOMS THAT SHOULD BE REPORTED IMMEDIATELY:  *FEVER GREATER THAN 100.5 F  *CHILLS WITH OR WITHOUT FEVER  NAUSEA AND VOMITING THAT IS NOT CONTROLLED WITH YOUR NAUSEA MEDICATION  *UNUSUAL SHORTNESS OF BREATH  *UNUSUAL BRUISING OR BLEEDING  TENDERNESS IN MOUTH AND THROAT WITH OR WITHOUT PRESENCE OF ULCERS  *URINARY PROBLEMS  *BOWEL PROBLEMS  UNUSUAL RASH Items with * indicate a potential emergency and should be followed up as soon as possible.  Feel free to call the clinic should you have any questions or concerns. The clinic phone number is [phone].  Please show the [facility] at check-in to the Emergency Department and triage nurse.  Denosumab injection What is this medicine? DENOSUMAB (den oh sue mab) slows bone breakdown. Prolia is used to treat osteoporosis in women after menopause and in men, and in people who are taking corticosteroids for 6 months or more. Delton See is used to treat a high calcium level due to cancer and to prevent bone fractures and other bone problems caused by multiple myeloma or cancer bone metastases. Delton See is also used to treat giant cell tumor of the bone. This medicine may be used for other purposes; ask your health care provider or pharmacist if you have questions. COMMON BRAND NAME(S): Prolia, XGEVA What should I tell my health care provider before I take this medicine? They need to know if you have any of these conditions: -dental disease -having surgery or tooth extraction -infection -kidney disease -low levels of calcium or Vitamin D  in the blood -malnutrition -on hemodialysis -skin conditions or sensitivity -thyroid or parathyroid disease -an unusual reaction to denosumab, other medicines, foods, dyes, or preservatives -pregnant or trying to get pregnant -breast-feeding How should I use this medicine? This medicine is for injection under the skin. It is given by a health care professional in a hospital or clinic setting. A special MedGuide will be given to you before each treatment. Be sure to read this information carefully each time. For Prolia, talk to your pediatrician regarding the use of this medicine in children. Special care may be needed. For Delton See, talk to your pediatrician regarding the use of this medicine in children. While this drug may be prescribed for children as young as 13 years for selected conditions, precautions do apply. Overdosage: If you think you have taken too much of this medicine contact a poison control center or emergency room at once. NOTE: This medicine is only for you. Do not share this medicine with others. What if I miss a dose? It is important not to miss your dose. Call your doctor or health care professional if you are unable to keep an appointment. What may interact with this medicine? Do not take this medicine with any of the following medications: -other medicines containing denosumab This medicine may also interact with the following medications: -medicines that lower your chance of fighting infection -steroid medicines like prednisone or cortisone This list may not describe all possible interactions. Give your health care provider a list of all the medicines, herbs, non-prescription drugs, or dietary supplements you use. Also tell them if you smoke, drink  alcohol, or use illegal drugs. Some items may interact with your medicine. What should I watch for while using this medicine? Visit your doctor or health care professional for regular checks on your progress. Your doctor or  health care professional may order blood tests and other tests to see how you are doing. Call your doctor or health care professional for advice if you get a fever, chills or sore throat, or other symptoms of a cold or flu. Do not treat yourself. This drug may decrease your body's ability to fight infection. Try to avoid being around people who are sick. You should make sure you get enough calcium and vitamin D while you are taking this medicine, unless your doctor tells you not to. Discuss the foods you eat and the vitamins you take with your health care professional. See your dentist regularly. Brush and floss your teeth as directed. Before you have any dental work done, tell your dentist you are receiving this medicine. Do not become pregnant while taking this medicine or for 5 months after stopping it. Talk with your doctor or health care professional about your birth control options while taking this medicine. Women should inform their doctor if they wish to become pregnant or think they might be pregnant. There is a potential for serious side effects to an unborn child. Talk to your health care professional or pharmacist for more information. What side effects may I notice from receiving this medicine? Side effects that you should report to your doctor or health care professional as soon as possible: -allergic reactions like skin rash, itching or hives, swelling of the face, lips, or tongue -bone pain -breathing problems -dizziness -jaw pain, especially after dental work -redness, blistering, peeling of the skin -signs and symptoms of infection like fever or chills; cough; sore throat; pain or trouble passing urine -signs of low calcium like fast heartbeat, muscle cramps or muscle pain; pain, tingling, numbness in the hands or feet; seizures -unusual bleeding or bruising -unusually weak or tired Side effects that usually do not require medical attention (report to your doctor or health care  professional if they continue or are bothersome): -constipation -diarrhea -headache -joint pain -loss of appetite -muscle pain -runny nose -tiredness -upset stomach This list may not describe all possible side effects. Call your doctor for medical advice about side effects. You may report side effects to FDA at 1-800-FDA-1088. Where should I keep my medicine? This medicine is only given in a clinic, doctor's office, or other health care setting and will not be stored at home. NOTE: This sheet is a summary. It may not cover all possible information. If you have questions about this medicine, talk to your doctor, pharmacist, or health care provider.  2019 [employer] (2017-09-05 16:10:44)  

## 2018-05-18 NOTE — Progress Notes (Signed)
05/18/18  Maintain dose at 1400 mg despite weight drop.  Dr Irene Limbo will monitor weight and adjust dose if needed.   T.O. Dr Cleda Clarks, PharmD

## 2018-05-18 NOTE — Telephone Encounter (Signed)
Added appt per 1/6 sch message - left message for patient with appt date and time

## 2018-05-22 NOTE — Progress Notes (Signed)
HEMATOLOGY/ONCOLOGY CLINIC NOTE  Date of Service: 05/25/2018  Patient Care Team: [name], MD as PCP - General (Family Medicine) [name], MD (Cardiology) [name], MD (Cardiology)  CHIEF COMPLAINTS/PURPOSE OF CONSULTATION:  Newly diagnosed myeloma  HISTORY OF PRESENTING ILLNESS:   Jonathon [name] is a wonderful 83 y.o. male who has been referred to [place] by Dr. [name] for evaluation and management of Lytic bone lesions. He is accompanied today by his daughter. The pt reports that he is doing well overall. He is currently a resident at [facility] in [place], [place].   The pt presented to the ED on 03/24/18 with a right distal clavicle fracture, which occurred after trying to get out of bed and pushing himself up with his right arm. He notes that his energy has been noticeably decreased for the last month. He also notes that his lower back has a slight sting when trying to stand up from sitting. He also notes some pain in his lower, right chest wall, which is exacerbated when coughing. The pt has been taking 65m Tramadol which is controlling his pain. He does endorse some weakening of both of his lower extremities in the past 1-2 months, which he attributes somewhat to his recent lower back pain.   The pt denies any bowel abnormalities recently. He notes that he stays very well hydrated, and urinates 4-5 times a night, and does have BPH, and takes Flomax. The pt denies any recent changes in his urination habits.   The pt notes that his appetite decreased for a few months, until he began assisted living. He has lost 9 pounds in the last week.   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 He now uses a walker to ambulate. He notes that the CIDP symptoms are primarily located to his legs. He sees Dr. [name]. He took steroids  without any symptomatic relief. IVIG was cost-prohibitive for the patient, but was recommended.    Regarding the patient's anemia in April 2019, he denies any obvious bleeding at that time, and denies surgeries.    The pt notes that his last colonoscopy, in 2015, was not concerning, which is corroborated by chart review.   The pt and daughter deny any concerns for memory issues, and the daughter adds his memory is "elephant-like". They are not sure why Namenda is on his medication list.   The pt denies any recent dental procedures, or concerns for dental issues. He has maintained every 6 month dental cleanings for all of his life.   Of note prior to the patient's visit today, pt has had a CT C/A/P completed on 03/31/18 with results revealing IMPRESSION: 1. Lytic lesion throughout all visualized vertebra from the lower cervical spine through the sacrum. Epidural extension of tumor T2, T3 and L2 level. Right L3-4 foraminal extension tumor. 2. Lytic lesions involving majority of ribs. Multiple pathologic rib fractures with bony expansion/sub pleural extension of tumor at several levels. 3. Lytic lesions of the scapula, hips bilaterally and pelvis bilaterally. With further progression of tumor, patient may be at risk for pathologic fractures. Pathologic fracture right clavicle incompletely assessed. 4. Circumferential narrowing sigmoid colon. Question possibility of underlying mass (series 2, image 97). Alternatively this could represent muscular hypertrophy/peristalsis. 5. Gastric antrum circumferential narrowing. Cannot exclude mass although this may  be related to peristalsis. 6. No primary lung malignancy noted. 7. Matted aortic pulmonary window lymph nodes. 8. Circumferential urinary bladder wall thickening greater anterior dome region. Right lateral bladder diverticulum. 9. 5 mm low-density lesion pancreatic body (series 5, image 10) unchanged. This is felt unlikely to be a primary malignancy. 10.  Right parapelvic 2.2 cm cyst without significant change. Scattered low-density renal lesions bila2terally too small to adequately characterize. Some were present previously. Statistically these are likely cysts. 11. Stable adrenal gland hyperplasia. 12. Gynecomastia. 13. Prostate gland causes slight impression upon the bladder base. 14.  Aortic Atherosclerosis. 15. Sequential pacemaker in place. 16. Gallstones.  Most recent lab results (03/25/18) of CBC w/diff and CMP is as follows: all values are WNL except for RBC at 4.14, HGB at 11.3, HCT at 33.6, Chloride at 94, CO2 at 33, Glucose at 100, BUN at 25, Total Protein at 9.4, Albumin at 3.4, Calcium at 13.8, GFR at 58. 03/25/18 PSA, Total was normal at 1.2  On review of systems, pt reports positional lower back pain, lower right chest wall pain, recently decreased energy levels, relatively weaker appetite, weight loss, right clavicle pain, weakening of the lower extremities, moving his bowels well, and denies changes in urination, abdominal pains, changes in breathing, changes in bowel habits, pain along the spine, leg swelling, and any other symptoms.  Interval History:   Jonathon [name] returns today for management, evaluation, and C1D22 treatment of his Multiple Myeloma. The patient's last visit with [place] was on 04/24/18. He is accompanied today by his daughter. The pt reports that he is doing well overall.   The pt reports that he has tolerated treatment well so far. He notes that his constant pain is "not there," and generally better overall. He notes some pain when starting to stand.   The pt notes that he cannot put as much pressure on his left hip as the other side. He notes that there is some weakness but denies pain. He did see his orthopedist in the interim, who has referred the pt to another orthopedist, which the pt notes he is not very inclined to pursue as this would mean traveling out of town.   The pt notes that he is eating better and is  enjoying improved energy levels. The pt has gained a couple pounds in the interim.   Lab results today (05/25/18) of CBC w/diff is as follows: all values are WNL except for HGB at 11.8, HCT at 37.9, RDW at 15.9. 05/25/18 CMP is pending   On review of systems, pt reports improved pain overall, eating well, improved energy levels, weight gain, and denies abdominal pains, difficulty breathing, new bone pains, and any other symptoms.   MEDICAL HISTORY:  Past Medical History:  Diagnosis Date  . Asthma   . BENIGN PROSTATIC HYPERTROPHY, HX OF 10/25/2008  . Bone metastases ([place]) 04/14/2018  . BRADYCARDIA 2005  . Chronic diastolic congestive heart failure ([place]) 06/05/2016  . CIDP (chronic inflammatory demyelinating polyneuropathy) ([place])   . HYPERTENSION 11/21/2006  . Iron deficiency anemia, unspecified 04/19/2013  . Multiple myeloma ([place]) 04/29/2018  . 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 PACEMAKER, PERMANENT 2005   Gen change 2014 Medtronic Adaptic L dual-chamber pacemaker, serial [id number]   . SVT (supraventricular tachycardia) (Home)   . TOBACCO ABUSE 10/25/2008   Quit 2012    SURGICAL HISTORY: Past Surgical History:  Procedure Laterality Date  . COLONOSCOPY  [id number]  . PACEMAKER INSERTION  2005  . PERMANENT  PACEMAKER GENERATOR CHANGE N/A 06/19/2012   Procedure: PERMANENT PACEMAKER GENERATOR CHANGE;  Surgeon: [name], MD; Medtronic Adaptic L dual-chamber pacemaker, serial #[id number]    . SKIN GRAFT Right 1962   wrist    SOCIAL HISTORY: Social History   Socioeconomic History  . Marital status: Divorced    Spouse name: Not on file  . Number of children: 4  . Years of education: Not on file  . Highest education level: Not on file  Occupational History  . Occupation: ENVIROMENTAL Location manager: [employer]  . Occupation: retired  Scientific laboratory technician  . Financial resource strain: Not on file  . Food insecurity:    Worry: Not on file    Inability:  Not on file  . Transportation needs:    Medical: Not on file    Non-medical: Not on file  Tobacco Use  . Smoking status: Former Smoker    Packs/day: 0.25    Years: 46.00    Pack years: 11.50    Types: Cigarettes    Start date: 03/16/1965    Last attempt to quit: 06/15/2010    Years since quitting: 7.9  . Smokeless tobacco: Never Used  . Tobacco comment: quit 3 years ago  Substance and Sexual Activity  . Alcohol use: No    Alcohol/week: 0.0 standard drinks    Comment: 4 beers per day  . Drug use: No  . Sexual activity: Not on file  Lifestyle  . Physical activity:    Days per week: Not on file    Minutes per session: Not on file  . Stress: Not on file  Relationships  . Social connections:    Talks on phone: Not on file    Gets together: Not on file    Attends religious service: Not on file    Active member of club or organization: Not on file    Attends meetings of clubs or organizations: Not on file    Relationship status: Not on file  . Intimate partner violence:    Fear of current or ex partner: Not on file    Emotionally abused: Not on file    Physically abused: Not on file    Forced sexual activity: Not on file  Other Topics Concern  . Not on file  Social History Narrative   Has relocated from [place] in 2007. 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    FAMILY HISTORY: Family History  Problem Relation Age of Onset  . Heart attack Father        Died, 66  . Kidney disease Father   . Parkinson's disease Mother        Died, 81  . Breast cancer Sister        Living, 42  . Diabetes type II Brother        Living, 64  . Breast cancer Sister        Living, 20  . Diabetes Daughter        Living, 60  . Diabetes Son        Living, 58  . Ovarian cancer Daughter   . Colon cancer Neg Hx   . Rectal cancer Neg Hx   . Stomach cancer Neg Hx     ALLERGIES:  is allergic to lexapro [escitalopram oxalate].  MEDICATIONS:  Current Outpatient Medications    Medication Sig Dispense Refill  . acyclovir (ZOVIRAX) 400 MG tablet Take 1 tablet (400 mg total) by  mouth 2 (two) times daily. 60 tablet 11  . aspirin EC 81 MG tablet Take 81 mg by mouth daily.    Marland Kitchen dexamethasone (DECADRON) 4 MG tablet 74m (5 tabs) with breakfast the day after each Daratumumab infusion 20 tablet 4  . flecainide (TAMBOCOR) 50 MG tablet Take 75 mg by mouth daily as needed.    . furosemide (LASIX) 40 MG tablet Take 40 mg by mouth daily.    . irbesartan (AVAPRO) 300 MG tablet Take 300 mg by mouth daily.    . Melatonin 3 MG CAPS Take 3 mg by mouth at bedtime.    . memantine (NAMENDA) 5 MG tablet Take 5 mg by mouth 2 (two) times daily.    . metoprolol tartrate (LOPRESSOR) 25 MG tablet Take 1 tablet (25 mg total) by mouth 2 (two) times daily.    . ondansetron (ZOFRAN) 8 MG tablet Take 1 tablet (8 mg total) by mouth 2 (two) times daily as needed (Nausea or vomiting). 30 tablet 1  . polyethylene glycol (MIRALAX / GLYCOLAX) packet Take 17 g by mouth daily. 14 each 0  . prochlorperazine (COMPAZINE) 10 MG tablet Take 1 tablet (10 mg total) by mouth every 6 (six) hours as needed (Nausea or vomiting). 30 tablet 1  . Saccharomyces boulardii (FLORASTOR PO) Take 250 mg by mouth 2 (two) times daily.    . tamsulosin (FLOMAX) 0.4 MG CAPS capsule Take 2 capsules (0.8 mg total) by mouth daily. 90 capsule 0  . traMADol (ULTRAM) 50 MG tablet Take 1 tablet( 50 mg total) by mouth every 8 hrs as needed for pain 90 tablet 2  . vitamin B-12 (CYANOCOBALAMIN) 500 MCG tablet Take 1,000 mcg by mouth daily.    . Vitamin D, Ergocalciferol, (DRISDOL) 50000 units CAPS capsule Take 50,000 Units by mouth every 7 (seven) days.     No current facility-administered medications for this visit.     REVIEW OF SYSTEMS:    A 10+ POINT REVIEW OF SYSTEMS WAS OBTAINED including neurology, dermatology, psychiatry, cardiac, respiratory, lymph, extremities, GI, GU, Musculoskeletal, constitutional, breasts, reproductive,  HEENT.  All pertinent positives are noted in the HPI.  All others are negative.   PHYSICAL EXAMINATION: ECOG PERFORMANCE STATUS: 2 - Symptomatic, <50% confined to bed  . Vitals:   05/25/18 0848  BP: (!) 172/95  Pulse: 68  Resp: 18  Temp: 98.3 F (36.8 C)  SpO2: 100%   Filed Weights   05/25/18 0848  Weight: 190 lb 4.8 oz (86.3 kg)   .Body mass index is 27.31 kg/m.  GENERAL:alert, in no acute distress and comfortable SKIN: no acute rashes, no significant lesions EYES: conjunctiva are pink and non-injected, sclera anicteric OROPHARYNX: MMM, no exudates, no oropharyngeal erythema or ulceration NECK: supple, no JVD LYMPH:  no palpable lymphadenopathy in the cervical, axillary or inguinal regions LUNGS: clear to auscultation b/l with normal respiratory effort HEART: regular rate & rhythm ABDOMEN:  normoactive bowel sounds , non tender, not distended. No palpable hepatosplenomegaly.  Extremity: no pedal edema PSYCH: alert & oriented x 3 with fluent speech NEURO: no focal motor/sensory deficits   LABORATORY DATA:  I have reviewed the data as listed  . CBC Latest Ref Rng & Units 05/25/2018 05/18/2018 05/11/2018  WBC 4.0 - 10.5 K/uL 4.3 4.2 4.5  Hemoglobin 13.0 - 17.0 g/dL 11.8(L) 11.7(L) 11.2(L)  Hematocrit 39.0 - 52.0 % 37.9(L) 37.1(L) 34.6(L)  Platelets 150 - 400 K/uL 150 190 214    . CMP Latest Ref Rng & Units  05/18/2018 05/11/2018 05/04/2018  Glucose 70 - 99 mg/dL 87 95 93  BUN 8 - 23 mg/dL '13 14 9  ' Creatinine 0.61 - 1.24 mg/dL 1.00 1.08 0.95  Sodium 135 - 145 mmol/L 140 139 140  Potassium 3.5 - 5.1 mmol/L 4.4 4.6 3.9  Chloride 98 - 111 mmol/L 108 106 109  CO2 22 - 32 mmol/L '23 25 23  ' Calcium 8.9 - 10.3 mg/dL 8.4(L) 8.6(L) 8.5(L)  Total Protein 6.5 - 8.1 g/dL 7.1 7.0 7.4  Total Bilirubin 0.3 - 1.2 mg/dL 0.6 0.7 0.4  Alkaline Phos 38 - 126 U/L 403(H) 353(H) 243(H)  AST 15 - 41 U/L '16 18 17  ' ALT 0 - 44 U/L '16 16 11   ' 04/17/18 BM Bx:    RADIOGRAPHIC STUDIES: I  have personally reviewed the radiological images as listed and agreed with the findings in the report. No results found.  ASSESSMENT & PLAN:  83 y.o. male with  1. Lytic bone lesions - w/u consistent with newly diagnosed multiple myeloma Labs upon initial presentation from 04/14/18; hypercalcemic with Calcium at 13.8, renal function up form baseline with Creatinine at 1.48, HGB at 11.3. PSA was normal.   03/31/18 CT C/A/P revealed 1. Lytic lesion throughout all visualized vertebra from the lower cervical spine through the sacrum. Epidural extension of tumor T2, T3 and L2 level. Right L3-4 foraminal extension tumor. 2. Lytic lesions involving majority of ribs. Multiple pathologic rib fractures with bony expansion/sub pleural extension of tumor at several levels. 3. Lytic lesions of the scapula, hips bilaterally and pelvis bilaterally. With further progression of tumor, patient may be at risk for pathologic fractures. Pathologic fracture right clavicle incompletely assessed. 4. Circumferential narrowing sigmoid colon. Question possibility of underlying mass (series 2, image 97). Alternatively this could represent muscular hypertrophy/peristalsis. 5. Gastric antrum circumferential narrowing. Cannot exclude mass although this may be related to peristalsis. 6. No primary lung malignancy noted. 7. Matted aortic pulmonary window lymph nodes. 8. Circumferential urinary bladder wall thickening greater anterior dome region. Right lateral bladder diverticulum. 9. 5 mm low-density lesion pancreatic body (series 5, image 10) unchanged. This is felt unlikely to be a primary malignancy. 10. Right parapelvic 2.2 cm cyst without significant change. Scattered low-density renal lesions bilaterally too small to adequately characterize. Some were present previously. Statistically these are likely cysts. 11. Stable adrenal gland hyperplasia. 12. Gynecomastia. 13. Prostate gland causes slight impression upon the bladder base. 14.   Aortic Atherosclerosis. 15. Sequential pacemaker in place. 16. Gallstones.   04/23/18 PET/CT revealed Innumerable hypermetabolic lytic lesions throughout the skeleton especially involving the spine, ribs, bony pelvis along with some involvement of both proximal femurs. The appearance is compatible with pathologic diagnosis of plasma cell neoplasm could well reflect multiple myeloma. Resulting pathologic fractures of the right lateral clavicle and of multiple bilateral ribs. Lesions are present along the cortical margins of the spinal canal. 2. Both the area and the stomach in the area in the sigmoid colon drawn attention to on prior CT scan appear benign normal today. 3. The confluence of AP window lymph nodes measures about 1.2 cm in short axis with maximum SUV 3.1, which is mildly above the blood pool merit surveillance. 4. Other imaging findings of potential clinical significance: Aortic Atherosclerosis. Coronary atherosclerosis. Pacemaker noted. Borderline prostatomegaly. Cholelithiasis.  04/21/18 BM Bx revealed Normocellular bone [name] with Plasma Cell neoplasm. Scattered medium sized clusters of kappa-restricted plasma cells (14% aspirate, 10% CD138 immunohistochemistry.   I do suspect that the burden of disease  is underestimated in the bone [name] sample given PET/CT findings of innumerable lytic lesions and an M spike of 3.4g   2. Pathologic right clavicle fracture 3. Hypercalcemia 03/25/18 CMP revealed Calcium at 13.8-- now down to 11.1 Will begin Xgeva injections, and strongly recommended keeping very well hydrated   PLAN:  -Discussed pt labwork today, 05/25/18; calcium levels and anemia have been improving with treatment initiation -The pt has no prohibitive toxicities from continuing C1D22 Daratumumab and Dexamethasone at this time.   -Discussed that the patient's CIDP needs to be considered in the treatment choice -Continue Xgeva every 4 weeks -Again advised that the pt return to care  with his orthopedist for consideration of risk stratification of his hip and femur involvement, and determining need for prophylactic fixation -Again advised that the pt limit his physical stressors, and limit bending, pushing, pulling and stairs to limit risk of further pathologic bone fractures -Continue Tramadol for fracture related pain -Will begin Acyclovir and discussed the risk of developing Shingles -Will see the pt back in 2 weeks   Continue Daratumumab with labs as ordered. Please schedule C2 . RTC with Dr Irene Limbo with labs in 2 weeks X Ray Hip b/l and pelvis in 12 days   All of the patients questions were answered with apparent satisfaction. The patient knows to call the clinic with any problems, questions or concerns.  The total time spent in the appt was 25 minutes and more than 50% was on counseling and direct patient cares.    Sullivan Lone MD MS AAHIVMS [facility] Hematology/Oncology Physician [facility]  (Office):       [phone] (Work cell):  [phone] (Fax):           [phone]  05/25/2018 9:28 AM  I, Baldwin [place], am acting as a scribe for Dr. Sullivan Lone.   .I have reviewed the above documentation for accuracy and completeness, and I agree with the above. Brunetta Genera MD

## 2018-05-25 ENCOUNTER — Encounter: Payer: Self-pay | Admitting: Hematology

## 2018-05-25 ENCOUNTER — Inpatient Hospital Stay: Payer: Medicare Other

## 2018-05-25 ENCOUNTER — Inpatient Hospital Stay (HOSPITAL_BASED_OUTPATIENT_CLINIC_OR_DEPARTMENT_OTHER): Payer: Medicare Other | Admitting: Hematology

## 2018-05-25 VITALS — BP 135/70 | HR 63 | Temp 97.5°F | Resp 17

## 2018-05-25 VITALS — BP 172/95 | HR 68 | Temp 98.3°F | Resp 18 | Ht 70.0 in | Wt 190.3 lb

## 2018-05-25 DIAGNOSIS — G6181 Chronic inflammatory demyelinating polyneuritis: Secondary | ICD-10-CM

## 2018-05-25 DIAGNOSIS — R634 Abnormal weight loss: Secondary | ICD-10-CM

## 2018-05-25 DIAGNOSIS — M545 Low back pain: Secondary | ICD-10-CM

## 2018-05-25 DIAGNOSIS — M25511 Pain in right shoulder: Secondary | ICD-10-CM | POA: Diagnosis not present

## 2018-05-25 DIAGNOSIS — R531 Weakness: Secondary | ICD-10-CM

## 2018-05-25 DIAGNOSIS — R0789 Other chest pain: Secondary | ICD-10-CM | POA: Diagnosis not present

## 2018-05-25 DIAGNOSIS — N62 Hypertrophy of breast: Secondary | ICD-10-CM

## 2018-05-25 DIAGNOSIS — Z7189 Other specified counseling: Secondary | ICD-10-CM

## 2018-05-25 DIAGNOSIS — C9000 Multiple myeloma not having achieved remission: Secondary | ICD-10-CM | POA: Diagnosis not present

## 2018-05-25 DIAGNOSIS — D649 Anemia, unspecified: Secondary | ICD-10-CM

## 2018-05-25 DIAGNOSIS — R5383 Other fatigue: Secondary | ICD-10-CM

## 2018-05-25 DIAGNOSIS — N400 Enlarged prostate without lower urinary tract symptoms: Secondary | ICD-10-CM

## 2018-05-25 DIAGNOSIS — I5032 Chronic diastolic (congestive) heart failure: Secondary | ICD-10-CM

## 2018-05-25 DIAGNOSIS — Z950 Presence of cardiac pacemaker: Secondary | ICD-10-CM

## 2018-05-25 DIAGNOSIS — I110 Hypertensive heart disease with heart failure: Secondary | ICD-10-CM

## 2018-05-25 DIAGNOSIS — Z7982 Long term (current) use of aspirin: Secondary | ICD-10-CM

## 2018-05-25 DIAGNOSIS — I2510 Atherosclerotic heart disease of native coronary artery without angina pectoris: Secondary | ICD-10-CM

## 2018-05-25 DIAGNOSIS — Z87891 Personal history of nicotine dependence: Secondary | ICD-10-CM

## 2018-05-25 DIAGNOSIS — Z5112 Encounter for antineoplastic immunotherapy: Secondary | ICD-10-CM | POA: Diagnosis not present

## 2018-05-25 DIAGNOSIS — Z79899 Other long term (current) drug therapy: Secondary | ICD-10-CM

## 2018-05-25 DIAGNOSIS — M899 Disorder of bone, unspecified: Secondary | ICD-10-CM | POA: Diagnosis not present

## 2018-05-25 DIAGNOSIS — N323 Diverticulum of bladder: Secondary | ICD-10-CM

## 2018-05-25 LAB — COMPREHENSIVE METABOLIC PANEL
ALT: 20 U/L (ref 0–44)
AST: 18 U/L (ref 15–41)
Albumin: 3.3 g/dL — ABNORMAL LOW (ref 3.5–5.0)
Alkaline Phosphatase: 375 U/L — ABNORMAL HIGH (ref 38–126)
Anion gap: 7 (ref 5–15)
BUN: 14 mg/dL (ref 8–23)
CO2: 24 mmol/L (ref 22–32)
Calcium: 8 mg/dL — ABNORMAL LOW (ref 8.9–10.3)
Chloride: 108 mmol/L (ref 98–111)
Creatinine, Ser: 0.9 mg/dL (ref 0.61–1.24)
GFR calc Af Amer: >60 mL/min (ref >60)
GFR calc non Af Amer: >60 mL/min (ref >60)
Glucose, Bld: 86 mg/dL (ref 70–99)
Potassium: 4.1 mmol/L (ref 3.5–5.1)
Sodium: 139 mmol/L (ref 135–145)
Total Bilirubin: 0.6 mg/dL (ref 0.3–1.2)
Total Protein: 6.6 g/dL (ref 6.5–8.1)

## 2018-05-25 LAB — CBC WITH DIFFERENTIAL/PLATELET
Abs Immature Granulocytes: 0.01 10*3/uL (ref 0.00–0.07)
Basophils Absolute: 0 10*3/uL (ref 0.0–0.1)
Basophils Relative: 1 %
Eosinophils Absolute: 0.2 10*3/uL (ref 0.0–0.5)
Eosinophils Relative: 6 %
HEMATOCRIT: 37.9 % — AB (ref 39.0–52.0)
Hemoglobin: 11.8 g/dL — ABNORMAL LOW (ref 13.0–17.0)
IMMATURE GRANULOCYTES: 0 %
Lymphocytes Relative: 35 %
Lymphs Abs: 1.5 10*3/uL (ref 0.7–4.0)
MCH: 26.7 pg (ref 26.0–34.0)
MCHC: 31.1 g/dL (ref 30.0–36.0)
MCV: 85.7 fL (ref 80.0–100.0)
Monocytes Absolute: 0.6 10*3/uL (ref 0.1–1.0)
Monocytes Relative: 13 %
Neutro Abs: 2 10*3/uL (ref 1.7–7.7)
Neutrophils Relative %: 45 %
Platelets: 150 10*3/uL (ref 150–400)
RBC: 4.42 MIL/uL (ref 4.22–5.81)
RDW: 15.9 % — AB (ref 11.5–15.5)
WBC: 4.3 10*3/uL (ref 4.0–10.5)
nRBC: 0 % (ref 0.0–0.2)

## 2018-05-25 MED ORDER — MONTELUKAST SODIUM 10 MG PO TABS
10.0000 mg | ORAL_TABLET | Freq: Once | ORAL | Status: AC
  Administered 2018-05-25: 10 mg via ORAL

## 2018-05-25 MED ORDER — ACETAMINOPHEN 325 MG PO TABS
ORAL_TABLET | ORAL | Status: AC
  Filled 2018-05-25: qty 2

## 2018-05-25 MED ORDER — SODIUM CHLORIDE 0.9 % IV SOLN
16.2000 mg/kg | Freq: Once | INTRAVENOUS | Status: AC
  Administered 2018-05-25: 1400 mg via INTRAVENOUS
  Filled 2018-05-25: qty 60

## 2018-05-25 MED ORDER — DIPHENHYDRAMINE HCL 25 MG PO CAPS
50.0000 mg | ORAL_CAPSULE | Freq: Once | ORAL | Status: AC
  Administered 2018-05-25: 50 mg via ORAL

## 2018-05-25 MED ORDER — FAMOTIDINE IN NACL 20-0.9 MG/50ML-% IV SOLN
20.0000 mg | Freq: Once | INTRAVENOUS | Status: AC
  Administered 2018-05-25: 20 mg via INTRAVENOUS

## 2018-05-25 MED ORDER — FAMOTIDINE IN NACL 20-0.9 MG/50ML-% IV SOLN
INTRAVENOUS | Status: AC
  Filled 2018-05-25: qty 50

## 2018-05-25 MED ORDER — DIPHENHYDRAMINE HCL 25 MG PO CAPS
ORAL_CAPSULE | ORAL | Status: AC
  Filled 2018-05-25: qty 2

## 2018-05-25 MED ORDER — SODIUM CHLORIDE 0.9 % IV SOLN: 16.0000 mg/kg | Freq: Once | INTRAVENOUS | Status: DC

## 2018-05-25 MED ORDER — MONTELUKAST SODIUM 10 MG PO TABS
ORAL_TABLET | ORAL | Status: AC
  Filled 2018-05-25: qty 1

## 2018-05-25 MED ORDER — SODIUM CHLORIDE 0.9 % IV SOLN
20.0000 mg | Freq: Once | INTRAVENOUS | Status: AC
  Administered 2018-05-25: 20 mg via INTRAVENOUS
  Filled 2018-05-25: qty 2

## 2018-05-25 MED ORDER — SODIUM CHLORIDE 0.9 % IV SOLN
Freq: Once | INTRAVENOUS | Status: AC
  Administered 2018-05-25: 10:00:00 via INTRAVENOUS
  Filled 2018-05-25: qty 250

## 2018-05-25 MED ORDER — ACETAMINOPHEN 325 MG PO TABS
650.0000 mg | ORAL_TABLET | Freq: Once | ORAL | Status: AC
  Administered 2018-05-25: 650 mg via ORAL

## 2018-05-25 NOTE — Patient Instructions (Signed)
[facility] Discharge Instructions for Patients Receiving Chemotherapy  Today you received the following chemotherapy agents: Daratumumab (Darzalex)  To help prevent nausea and vomiting after your treatment, we encourage you to take your nausea medication as directed.    If you develop nausea and vomiting that is not controlled by your nausea medication, call the clinic.   BELOW ARE SYMPTOMS THAT SHOULD BE REPORTED IMMEDIATELY:  *FEVER GREATER THAN 100.5 F  *CHILLS WITH OR WITHOUT FEVER  NAUSEA AND VOMITING THAT IS NOT CONTROLLED WITH YOUR NAUSEA MEDICATION  *UNUSUAL SHORTNESS OF BREATH  *UNUSUAL BRUISING OR BLEEDING  TENDERNESS IN MOUTH AND THROAT WITH OR WITHOUT PRESENCE OF ULCERS  *URINARY PROBLEMS  *BOWEL PROBLEMS  UNUSUAL RASH Items with * indicate a potential emergency and should be followed up as soon as possible.  Feel free to call the clinic should you have any questions or concerns. The clinic phone number is [phone].  Please show the [facility] at check-in to the Emergency Department and triage nurse.  Denosumab injection What is this medicine? DENOSUMAB (den oh sue mab) slows bone breakdown. Prolia is used to treat osteoporosis in women after menopause and in men, and in people who are taking corticosteroids for 6 months or more. Delton See is used to treat a high calcium level due to cancer and to prevent bone fractures and other bone problems caused by multiple myeloma or cancer bone metastases. Delton See is also used to treat giant cell tumor of the bone. This medicine may be used for other purposes; ask your health care provider or pharmacist if you have questions. COMMON BRAND NAME(S): Prolia, XGEVA What should I tell my health care provider before I take this medicine? They need to know if you have any of these conditions: -dental disease -having surgery or tooth extraction -infection -kidney disease -low levels of calcium or Vitamin D  in the blood -malnutrition -on hemodialysis -skin conditions or sensitivity -thyroid or parathyroid disease -an unusual reaction to denosumab, other medicines, foods, dyes, or preservatives -pregnant or trying to get pregnant -breast-feeding How should I use this medicine? This medicine is for injection under the skin. It is given by a health care professional in a hospital or clinic setting. A special MedGuide will be given to you before each treatment. Be sure to read this information carefully each time. For Prolia, talk to your pediatrician regarding the use of this medicine in children. Special care may be needed. For Delton See, talk to your pediatrician regarding the use of this medicine in children. While this drug may be prescribed for children as young as 13 years for selected conditions, precautions do apply. Overdosage: If you think you have taken too much of this medicine contact a poison control center or emergency room at once. NOTE: This medicine is only for you. Do not share this medicine with others. What if I miss a dose? It is important not to miss your dose. Call your doctor or health care professional if you are unable to keep an appointment. What may interact with this medicine? Do not take this medicine with any of the following medications: -other medicines containing denosumab This medicine may also interact with the following medications: -medicines that lower your chance of fighting infection -steroid medicines like prednisone or cortisone This list may not describe all possible interactions. Give your health care provider a list of all the medicines, herbs, non-prescription drugs, or dietary supplements you use. Also tell them if you smoke, drink  alcohol, or use illegal drugs. Some items may interact with your medicine. What should I watch for while using this medicine? Visit your doctor or health care professional for regular checks on your progress. Your doctor or  health care professional may order blood tests and other tests to see how you are doing. Call your doctor or health care professional for advice if you get a fever, chills or sore throat, or other symptoms of a cold or flu. Do not treat yourself. This drug may decrease your body's ability to fight infection. Try to avoid being around people who are sick. You should make sure you get enough calcium and vitamin D while you are taking this medicine, unless your doctor tells you not to. Discuss the foods you eat and the vitamins you take with your health care professional. See your dentist regularly. Brush and floss your teeth as directed. Before you have any dental work done, tell your dentist you are receiving this medicine. Do not become pregnant while taking this medicine or for 5 months after stopping it. Talk with your doctor or health care professional about your birth control options while taking this medicine. Women should inform their doctor if they wish to become pregnant or think they might be pregnant. There is a potential for serious side effects to an unborn child. Talk to your health care professional or pharmacist for more information. What side effects may I notice from receiving this medicine? Side effects that you should report to your doctor or health care professional as soon as possible: -allergic reactions like skin rash, itching or hives, swelling of the face, lips, or tongue -bone pain -breathing problems -dizziness -jaw pain, especially after dental work -redness, blistering, peeling of the skin -signs and symptoms of infection like fever or chills; cough; sore throat; pain or trouble passing urine -signs of low calcium like fast heartbeat, muscle cramps or muscle pain; pain, tingling, numbness in the hands or feet; seizures -unusual bleeding or bruising -unusually weak or tired Side effects that usually do not require medical attention (report to your doctor or health care  professional if they continue or are bothersome): -constipation -diarrhea -headache -joint pain -loss of appetite -muscle pain -runny nose -tiredness -upset stomach This list may not describe all possible side effects. Call your doctor for medical advice about side effects. You may report side effects to FDA at 1-800-FDA-1088. Where should I keep my medicine? This medicine is only given in a clinic, doctor's office, or other health care setting and will not be stored at home. NOTE: This sheet is a summary. It may not cover all possible information. If you have questions about this medicine, talk to your doctor, pharmacist, or health care provider.  2019 Elsevier/Gold Standard (2017-09-05 16:10:44)  

## 2018-05-25 NOTE — Progress Notes (Signed)
05/25/18  Confirmed with Dr Irene Limbo ok to change infusion to rapid rate daratumumab.  Orders changed to reflect new rate.  T.O. Dr Cleda Clarks, PharmD

## 2018-05-25 NOTE — Patient Instructions (Signed)
Thank you for choosing [facility] to provide your oncology and hematology care.  To afford each patient quality time with our providers, please arrive 30 minutes before your scheduled appointment time.  If you arrive late for your appointment, you may be asked to reschedule.  We strive to give you quality time with our providers, and arriving late affects you and other patients whose appointments are after yours.    If you are a no show for multiple scheduled visits, you may be dismissed from the clinic at the providers discretion.     Again, thank you for choosing [facility], our hope is that these requests will decrease the amount of time that you wait before being seen by our physicians.  ______________________________________________________________________   Should you have questions after your visit to the [facility], please contact our office at [phone] between the hours of 8:30 and 4:30 p.m.    Voicemails left after 4:30p.m will not be returned until the following business day.     For prescription refill requests, please have your pharmacy contact us directly.  Please also try to allow 48 hours for prescription requests.     Please contact the scheduling department for questions regarding scheduling.  For scheduling of procedures such as PET scans, CT scans, MRI, Ultrasound, etc please contact central scheduling at [phone].     Resources For Cancer Patients and Caregivers:    Oncolink.org:  A wonderful resource for patients and healthcare providers for information regarding your disease, ways to tract your treatment, what to expect, etc.      American Cancer Society:  800-227-2345  Can help patients locate various types of support and financial assistance   Cancer Care: 1-800-813-HOPE (4673) Provides financial assistance, online support groups, medication/co-pay assistance.     [place] DSS:  336-641-3447 Where to apply  for food stamps, Medicaid, and utility assistance   Medicare Rights Center: 800-333-4114 Helps people with Medicare understand their rights and benefits, navigate the Medicare system, and secure the quality healthcare they deserve   SCAT: 336-333-6589 [place] [employer]'s shared-ride transportation service for eligible riders who have a disability that prevents them from riding the fixed route bus.     For additional information on assistance programs please contact our social worker:   Abigail Elmore:  [phone]  

## 2018-05-26 ENCOUNTER — Telehealth: Payer: Self-pay

## 2018-05-26 NOTE — Telephone Encounter (Signed)
Per 1/13 los to add [place] C2 already scheduled.

## 2018-05-27 ENCOUNTER — Telehealth: Payer: Self-pay

## 2018-06-01 ENCOUNTER — Inpatient Hospital Stay: Payer: Medicare Other

## 2018-06-01 ENCOUNTER — Other Ambulatory Visit: Payer: Self-pay | Admitting: Hematology

## 2018-06-01 VITALS — BP 160/89 | HR 63 | Temp 98.2°F | Resp 17 | Wt 193.5 lb

## 2018-06-01 DIAGNOSIS — Z7189 Other specified counseling: Secondary | ICD-10-CM

## 2018-06-01 DIAGNOSIS — C9000 Multiple myeloma not having achieved remission: Secondary | ICD-10-CM | POA: Diagnosis not present

## 2018-06-01 DIAGNOSIS — M899 Disorder of bone, unspecified: Secondary | ICD-10-CM | POA: Diagnosis not present

## 2018-06-01 DIAGNOSIS — R0789 Other chest pain: Secondary | ICD-10-CM | POA: Diagnosis not present

## 2018-06-01 DIAGNOSIS — Z5112 Encounter for antineoplastic immunotherapy: Secondary | ICD-10-CM | POA: Diagnosis not present

## 2018-06-01 DIAGNOSIS — D649 Anemia, unspecified: Secondary | ICD-10-CM | POA: Diagnosis not present

## 2018-06-01 LAB — CBC WITH DIFFERENTIAL/PLATELET
Abs Immature Granulocytes: 0.01 10*3/uL (ref 0.00–0.07)
Basophils Absolute: 0 10*3/uL (ref 0.0–0.1)
Basophils Relative: 1 %
Eosinophils Absolute: 0.2 10*3/uL (ref 0.0–0.5)
Eosinophils Relative: 4 %
HCT: 37.2 % — ABNORMAL LOW (ref 39.0–52.0)
HEMOGLOBIN: 12 g/dL — AB (ref 13.0–17.0)
Immature Granulocytes: 0 %
LYMPHS PCT: 33 %
Lymphs Abs: 1.4 10*3/uL (ref 0.7–4.0)
MCH: 27.2 pg (ref 26.0–34.0)
MCHC: 32.3 g/dL (ref 30.0–36.0)
MCV: 84.4 fL (ref 80.0–100.0)
Monocytes Absolute: 0.4 10*3/uL (ref 0.1–1.0)
Monocytes Relative: 10 %
Neutro Abs: 2.2 10*3/uL (ref 1.7–7.7)
Neutrophils Relative %: 52 %
Platelets: 141 10*3/uL — ABNORMAL LOW (ref 150–400)
RBC: 4.41 MIL/uL (ref 4.22–5.81)
RDW: 16.2 % — ABNORMAL HIGH (ref 11.5–15.5)
WBC: 4.2 10*3/uL (ref 4.0–10.5)
nRBC: 0 % (ref 0.0–0.2)

## 2018-06-01 LAB — CMP (CANCER CENTER ONLY)
ALT: 18 U/L (ref 0–44)
AST: 16 U/L (ref 15–41)
Albumin: 3.4 g/dL — ABNORMAL LOW (ref 3.5–5.0)
Alkaline Phosphatase: 327 U/L — ABNORMAL HIGH (ref 38–126)
Anion gap: 9 (ref 5–15)
BUN: 16 mg/dL (ref 8–23)
CO2: 24 mmol/L (ref 22–32)
Calcium: 7.8 mg/dL — ABNORMAL LOW (ref 8.9–10.3)
Chloride: 109 mmol/L (ref 98–111)
Creatinine: 0.99 mg/dL (ref 0.61–1.24)
GFR, Est AFR Am: >60 mL/min (ref >60)
GFR, Estimated: >60 mL/min (ref >60)
Glucose, Bld: 87 mg/dL (ref 70–99)
Potassium: 4.3 mmol/L (ref 3.5–5.1)
Sodium: 142 mmol/L (ref 135–145)
Total Bilirubin: 0.6 mg/dL (ref 0.3–1.2)
Total Protein: 6.7 g/dL (ref 6.5–8.1)

## 2018-06-01 MED ORDER — DIPHENHYDRAMINE HCL 25 MG PO CAPS
ORAL_CAPSULE | ORAL | Status: AC
  Filled 2018-06-01: qty 2

## 2018-06-01 MED ORDER — SODIUM CHLORIDE 0.9 % IV SOLN
16.2000 mg/kg | Freq: Once | INTRAVENOUS | Status: AC
  Administered 2018-06-01: 1400 mg via INTRAVENOUS
  Filled 2018-06-01: qty 60

## 2018-06-01 MED ORDER — ACETAMINOPHEN 325 MG PO TABS
650.0000 mg | ORAL_TABLET | Freq: Once | ORAL | Status: AC
  Administered 2018-06-01: 650 mg via ORAL

## 2018-06-01 MED ORDER — MONTELUKAST SODIUM 10 MG PO TABS
ORAL_TABLET | ORAL | Status: AC
  Filled 2018-06-01: qty 1

## 2018-06-01 MED ORDER — DIPHENHYDRAMINE HCL 25 MG PO CAPS
50.0000 mg | ORAL_CAPSULE | Freq: Once | ORAL | Status: AC
  Administered 2018-06-01: 50 mg via ORAL

## 2018-06-01 MED ORDER — SODIUM CHLORIDE 0.9 % IV SOLN
Freq: Once | INTRAVENOUS | Status: AC
  Administered 2018-06-01: 10:00:00 via INTRAVENOUS
  Filled 2018-06-01: qty 250

## 2018-06-01 MED ORDER — SODIUM CHLORIDE 0.9 % IV SOLN
20.0000 mg | Freq: Once | INTRAVENOUS | Status: AC
  Administered 2018-06-01: 20 mg via INTRAVENOUS
  Filled 2018-06-01: qty 2

## 2018-06-01 MED ORDER — FAMOTIDINE IN NACL 20-0.9 MG/50ML-% IV SOLN
INTRAVENOUS | Status: AC
  Filled 2018-06-01: qty 50

## 2018-06-01 MED ORDER — FAMOTIDINE IN NACL 20-0.9 MG/50ML-% IV SOLN
20.0000 mg | Freq: Once | INTRAVENOUS | Status: AC
  Administered 2018-06-01: 20 mg via INTRAVENOUS

## 2018-06-01 MED ORDER — ACETAMINOPHEN 325 MG PO TABS
ORAL_TABLET | ORAL | Status: AC
  Filled 2018-06-01: qty 2

## 2018-06-01 MED ORDER — MONTELUKAST SODIUM 10 MG PO TABS
10.0000 mg | ORAL_TABLET | Freq: Once | ORAL | Status: AC
  Administered 2018-06-01: 10 mg via ORAL

## 2018-06-01 NOTE — Patient Instructions (Signed)
[facility] Discharge Instructions for Patients Receiving Chemotherapy  Today you received the following chemotherapy agents Daratumumab(Darzalex)  To help prevent nausea and vomiting after your treatment, we encourage you to take your nausea medication as directed.    If you develop nausea and vomiting that is not controlled by your nausea medication, call the clinic.   BELOW ARE SYMPTOMS THAT SHOULD BE REPORTED IMMEDIATELY:  *FEVER GREATER THAN 100.5 F  *CHILLS WITH OR WITHOUT FEVER  NAUSEA AND VOMITING THAT IS NOT CONTROLLED WITH YOUR NAUSEA MEDICATION  *UNUSUAL SHORTNESS OF BREATH  *UNUSUAL BRUISING OR BLEEDING  TENDERNESS IN MOUTH AND THROAT WITH OR WITHOUT PRESENCE OF ULCERS  *URINARY PROBLEMS  *BOWEL PROBLEMS  UNUSUAL RASH Items with * indicate a potential emergency and should be followed up as soon as possible.  Feel free to call the clinic should you have any questions or concerns. The clinic phone number is [phone].  Please show the CHEMO ALERT CARD at check-in to the Emergency Department and triage nurse.   

## 2018-06-02 LAB — KAPPA/LAMBDA LIGHT CHAINS
Kappa free light chain: 16.8 mg/L (ref 3.3–19.4)
Kappa, lambda light chain ratio: 1.81 — ABNORMAL HIGH (ref 0.26–1.65)
Lambda free light chains: 9.3 mg/L (ref 5.7–26.3)

## 2018-06-03 LAB — MULTIPLE MYELOMA PANEL, SERUM
Albumin SerPl Elph-Mcnc: 3.3 g/dL (ref 2.9–4.4)
Albumin/Glob SerPl: 1.2 (ref 0.7–1.7)
Alpha 1: 0.2 g/dL (ref 0.0–0.4)
Alpha2 Glob SerPl Elph-Mcnc: 0.9 g/dL (ref 0.4–1.0)
B-Globulin SerPl Elph-Mcnc: 1.1 g/dL (ref 0.7–1.3)
GAMMA GLOB SERPL ELPH-MCNC: 0.6 g/dL (ref 0.4–1.8)
Globulin, Total: 2.8 g/dL (ref 2.2–3.9)
IgA: 339 mg/dL (ref 61–437)
IgG (Immunoglobin G), Serum: 767 mg/dL (ref 700–1600)
IgM (Immunoglobulin M), Srm: 58 mg/dL (ref 15–143)
M Protein SerPl Elph-Mcnc: 0.3 g/dL — ABNORMAL HIGH
Total Protein ELP: 6.1 g/dL (ref 6.0–8.5)

## 2018-06-05 NOTE — Progress Notes (Signed)
HEMATOLOGY/ONCOLOGY CLINIC NOTE  Date of Service: 06/08/2018  Patient Care Team: [name], MD as PCP - General (Family Medicine) [name], MD (Cardiology) [name], MD (Cardiology)  CHIEF COMPLAINTS/PURPOSE OF CONSULTATION:  Recently diagnosed myeloma  HISTORY OF PRESENTING ILLNESS:   Jonathon [name] is a wonderful 83 y.o. male who has been referred to [place] by Dr. [name] for evaluation and management of Lytic bone lesions. He is accompanied today by his daughter. The pt reports that he is doing well overall. He is currently a resident at [facility] in [place], [place].   The pt presented to the ED on 03/24/18 with a right distal clavicle fracture, which occurred after trying to get out of bed and pushing himself up with his right arm. He notes that his energy has been noticeably decreased for the last month. He also notes that his lower back has a slight sting when trying to stand up from sitting. He also notes some pain in his lower, right chest wall, which is exacerbated when coughing. The pt has been taking 22m Tramadol which is controlling his pain. He does endorse some weakening of both of his lower extremities in the past 1-2 months, which he attributes somewhat to his recent lower back pain.   The pt denies any bowel abnormalities recently. He notes that he stays very well hydrated, and urinates 4-5 times a night, and does have BPH, and takes Flomax. The pt denies any recent changes in his urination habits.   The pt notes that his appetite decreased for a few months, until he began assisted living. He has lost 9 pounds in the last week.   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 He now uses a walker to ambulate. He notes that the CIDP symptoms are primarily located to his legs. He sees Dr. [name] [facility]. He took  steroids without any symptomatic relief. IVIG was cost-prohibitive for the patient, but was recommended.    Regarding the patient's anemia in April 2019, he denies any obvious bleeding at that time, and denies surgeries.    The pt notes that his last colonoscopy, in 2015, was not concerning, which is corroborated by chart review.   The pt and daughter deny any concerns for memory issues, and the daughter adds his memory is "elephant-like". They are not sure why Namenda is on his medication list.   The pt denies any recent dental procedures, or concerns for dental issues. He has maintained every 6 month dental cleanings for all of his life.   Of note prior to the patient's visit today, pt has had a CT C/A/P completed on 03/31/18 with results revealing IMPRESSION: 1. Lytic lesion throughout all visualized vertebra from the lower cervical spine through the sacrum. Epidural extension of tumor T2, T3 and L2 level. Right L3-4 foraminal extension tumor. 2. Lytic lesions involving majority of ribs. Multiple pathologic rib fractures with bony expansion/sub pleural extension of tumor at several levels. 3. Lytic lesions of the scapula, hips bilaterally and pelvis bilaterally. With further progression of tumor, patient may be at risk for pathologic fractures. Pathologic fracture right clavicle incompletely assessed. 4. Circumferential narrowing sigmoid colon. Question possibility of underlying mass (series 2, image 97). Alternatively this could represent muscular hypertrophy/peristalsis. 5. Gastric antrum circumferential narrowing. Cannot exclude mass although this may  be related to peristalsis. 6. No primary lung malignancy noted. 7. Matted aortic pulmonary window lymph nodes. 8. Circumferential urinary bladder wall thickening greater anterior dome region. Right lateral bladder diverticulum. 9. 5 mm low-density lesion pancreatic body (series 5, image 10) unchanged. This is felt unlikely to be a primary  malignancy. 10. Right parapelvic 2.2 cm cyst without significant change. Scattered low-density renal lesions bila2terally too small to adequately characterize. Some were present previously. Statistically these are likely cysts. 11. Stable adrenal gland hyperplasia. 12. Gynecomastia. 13. Prostate gland causes slight impression upon the bladder base. 14.  Aortic Atherosclerosis. 15. Sequential pacemaker in place. 16. Gallstones.  Most recent lab results (03/25/18) of CBC w/diff and CMP is as follows: all values are WNL except for RBC at 4.14, HGB at 11.3, HCT at 33.6, Chloride at 94, CO2 at 33, Glucose at 100, BUN at 25, Total Protein at 9.4, Albumin at 3.4, Calcium at 13.8, GFR at 58. 03/25/18 PSA, Total was normal at 1.2  On review of systems, pt reports positional lower back pain, lower right chest wall pain, recently decreased energy levels, relatively weaker appetite, weight loss, right clavicle pain, weakening of the lower extremities, moving his bowels well, and denies changes in urination, abdominal pains, changes in breathing, changes in bowel habits, pain along the spine, leg swelling, and any other symptoms.  Interval History:   Jonathon [name] returns today for management, evaluation, and C2D8 treatment of his Multiple Myeloma. The patient's last visit with [place] was on 05/25/18. He is accompanied today by his daughter. The pt reports that he is doing well overall.   The pt reports that he has been feeling better as treatment has begun. He notes that his left hip pain has decreased, and he notes improving back and rib pain. The pt notes that he has been eating very well, and has gained 5 pounds in the last month. He has been wearing compression socks and notes that this has controlled his leg swelling. The pt has been taking Ergocalciferol once a week.  Lab results today (06/08/18) of CBC w/diff and CMP is as follows: all values are WNL except for HGB at 11.7, HCT at 36.1, RDW at 16.2, PLT at 146k,  Calcium at 8.4, Albumin at 3.4, AST at 13, Alk Phos at 240.  On review of systems, pt reports improving energy levels, improving hip back and rib pain, eating well, weight gain, and denies leg swelling, mouth sores, abdominal pains, and any other symptoms.   MEDICAL HISTORY:  Past Medical History:  Diagnosis Date  . Asthma   . BENIGN PROSTATIC HYPERTROPHY, HX OF 10/25/2008  . Bone metastases ([place]) 04/14/2018  . BRADYCARDIA 2005  . Chronic diastolic congestive heart failure ([place]) 06/05/2016  . CIDP (chronic inflammatory demyelinating polyneuropathy) ([place])   . HYPERTENSION 11/21/2006  . Iron deficiency anemia, unspecified 04/19/2013  . Multiple myeloma ([place]) 04/29/2018  . 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 PACEMAKER, PERMANENT 2005   Gen change 2014 Medtronic Adaptic L dual-chamber pacemaker, serial [id number]   . SVT (supraventricular tachycardia) ([place])   . TOBACCO ABUSE 10/25/2008   Quit 2012    SURGICAL HISTORY: Past Surgical History:  Procedure Laterality Date  . COLONOSCOPY  [id number]  . PACEMAKER INSERTION  2005  . PERMANENT PACEMAKER GENERATOR CHANGE N/A 06/19/2012   Procedure: PERMANENT PACEMAKER GENERATOR CHANGE;  Surgeon: [name], MD; Medtronic Adaptic L dual-chamber pacemaker, serial #[id number]    . SKIN GRAFT Right 1962   wrist  SOCIAL HISTORY: Social History   Socioeconomic History  . Marital status: Divorced    Spouse name: Not on file  . Number of children: 4  . Years of education: Not on file  . Highest education level: Not on file  Occupational History  . Occupation: ENVIROMENTAL Location manager: [employer]  . Occupation: retired  Scientific laboratory technician  . Financial resource strain: Not on file  . Food insecurity:    Worry: Not on file    Inability: Not on file  . Transportation needs:    Medical: Not on file    Non-medical: Not on file  Tobacco Use  . Smoking status: Former Smoker    Packs/day: 0.25    Years: 46.00     Pack years: 11.50    Types: Cigarettes    Start date: 03/16/1965    Last attempt to quit: 06/15/2010    Years since quitting: 7.9  . Smokeless tobacco: Never Used  . Tobacco comment: quit 3 years ago  Substance and Sexual Activity  . Alcohol use: No    Alcohol/week: 0.0 standard drinks    Comment: 4 beers per day  . Drug use: No  . Sexual activity: Not on file  Lifestyle  . Physical activity:    Days per week: Not on file    Minutes per session: Not on file  . Stress: Not on file  Relationships  . Social connections:    Talks on phone: Not on file    Gets together: Not on file    Attends religious service: Not on file    Active member of club or organization: Not on file    Attends meetings of clubs or organizations: Not on file    Relationship status: Not on file  . Intimate partner violence:    Fear of current or ex partner: Not on file    Emotionally abused: Not on file    Physically abused: Not on file    Forced sexual activity: Not on file  Other Topics Concern  . Not on file  Social History Narrative   Has relocated from [place] in 2007. 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    FAMILY HISTORY: Family History  Problem Relation Age of Onset  . Heart attack Father        Died, 8  . Kidney disease Father   . Parkinson's disease Mother        Died, 54  . Breast cancer Sister        Living, 78  . Diabetes type II Brother        Living, 82  . Breast cancer Sister        Living, 64  . Diabetes Daughter        Living, 46  . Diabetes Son        Living, 20  . Ovarian cancer Daughter   . Colon cancer Neg Hx   . Rectal cancer Neg Hx   . Stomach cancer Neg Hx     ALLERGIES:  is allergic to lexapro [escitalopram oxalate].  MEDICATIONS:  Current Outpatient Medications  Medication Sig Dispense Refill  . acyclovir (ZOVIRAX) 400 MG tablet Take 1 tablet (400 mg total) by mouth 2 (two) times daily. 60 tablet 11  . aspirin EC 81 MG tablet Take 81 mg by  mouth daily.    Marland Kitchen dexamethasone (DECADRON) 4 MG tablet 88m (5 tabs) with breakfast the day after each Daratumumab  infusion 20 tablet 4  . flecainide (TAMBOCOR) 50 MG tablet Take 75 mg by mouth daily as needed.    . furosemide (LASIX) 40 MG tablet Take 40 mg by mouth daily.    . irbesartan (AVAPRO) 300 MG tablet Take 300 mg by mouth daily.    . Melatonin 3 MG CAPS Take 3 mg by mouth at bedtime.    . memantine (NAMENDA) 5 MG tablet Take 5 mg by mouth 2 (two) times daily.    . metoprolol tartrate (LOPRESSOR) 25 MG tablet Take 1 tablet (25 mg total) by mouth 2 (two) times daily.    . ondansetron (ZOFRAN) 8 MG tablet Take 1 tablet (8 mg total) by mouth 2 (two) times daily as needed (Nausea or vomiting). 30 tablet 1  . polyethylene glycol (MIRALAX / GLYCOLAX) packet Take 17 g by mouth daily. 14 each 0  . prochlorperazine (COMPAZINE) 10 MG tablet Take 1 tablet (10 mg total) by mouth every 6 (six) hours as needed (Nausea or vomiting). 30 tablet 1  . Saccharomyces boulardii (FLORASTOR PO) Take 250 mg by mouth 2 (two) times daily.    . tamsulosin (FLOMAX) 0.4 MG CAPS capsule Take 2 capsules (0.8 mg total) by mouth daily. 90 capsule 0  . traMADol (ULTRAM) 50 MG tablet Take 1 tablet( 50 mg total) by mouth every 8 hrs as needed for pain 90 tablet 2  . vitamin B-12 (CYANOCOBALAMIN) 500 MCG tablet Take 1,000 mcg by mouth daily.    . Vitamin D, Ergocalciferol, (DRISDOL) 50000 units CAPS capsule Take 50,000 Units by mouth every 7 (seven) days.     No current facility-administered medications for this visit.     REVIEW OF SYSTEMS:    A 10+ POINT REVIEW OF SYSTEMS WAS OBTAINED including neurology, dermatology, psychiatry, cardiac, respiratory, lymph, extremities, GI, GU, Musculoskeletal, constitutional, breasts, reproductive, HEENT.  All pertinent positives are noted in the HPI.  All others are negative.   PHYSICAL EXAMINATION: ECOG PERFORMANCE STATUS: 2 - Symptomatic, <50% confined to bed  . Vitals:    06/08/18 0901  BP: (!) 155/86  Pulse: 71  Resp: 18  Temp: (!) 97.5 F (36.4 C)  SpO2: 100%   Filed Weights   06/08/18 0901  Weight: 192 lb 11.2 oz (87.4 kg)   .Body mass index is 27.65 kg/m.  GENERAL:alert, in no acute distress and comfortable SKIN: no acute rashes, no significant lesions EYES: conjunctiva are pink and non-injected, sclera anicteric OROPHARYNX: MMM, no exudates, no oropharyngeal erythema or ulceration NECK: supple, no JVD LYMPH:  no palpable lymphadenopathy in the cervical, axillary or inguinal regions LUNGS: clear to auscultation b/l with normal respiratory effort HEART: regular rate & rhythm ABDOMEN:  normoactive bowel sounds , non tender, not distended. No palpable hepatosplenomegaly.  Extremity: no pedal edema PSYCH: alert & oriented x 3 with fluent speech NEURO: no focal motor/sensory deficits   LABORATORY DATA:  I have reviewed the data as listed  . CBC Latest Ref Rng & Units 06/08/2018 06/01/2018 05/25/2018  WBC 4.0 - 10.5 K/uL 4.9 4.2 4.3  Hemoglobin 13.0 - 17.0 g/dL 11.7(L) 12.0(L) 11.8(L)  Hematocrit 39.0 - 52.0 % 36.1(L) 37.2(L) 37.9(L)  Platelets 150 - 400 K/uL 146(L) 141(L) 150    . CMP Latest Ref Rng & Units 06/08/2018 06/01/2018 05/25/2018  Glucose 70 - 99 mg/dL 96 87 86  BUN 8 - 23 mg/dL _0 Creatinine 0.61 - 1.24 mg/dL 0.97 0.99 0.90  Sodium 135 - 145 mmol/L 139 142 139  Potassium 3.5 - 5.1 mmol/L 4.6 4.3 4.1  Chloride 98 - 111 mmol/L 108 109 108  CO2 22 - 32 mmol/L _0 Calcium 8.9 - 10.3 mg/dL 8.4(L) 7.8(L) 8.0(L)  Total Protein 6.5 - 8.1 g/dL 6.5 6.7 6.6  Total Bilirubin 0.3 - 1.2 mg/dL 0.5 0.6 0.6  Alkaline Phos 38 - 126 U/L 240(H) 327(H) 375(H)  AST 15 - 41 U/L 13(L) 16 18  ALT 0 - 44 U/L _1 04/17/18 BM Bx:    RADIOGRAPHIC STUDIES: I have personally reviewed the radiological images as listed and agreed with the findings in the report. No results found.  ASSESSMENT & PLAN:  83 y.o. male with  1. Lytic  bone lesions - w/u consistent with newly diagnosed multiple myeloma Labs upon initial presentation from 04/14/18; hypercalcemic with Calcium at 13.8, renal function up form baseline with Creatinine at 1.48, HGB at 11.3. PSA was normal.   03/31/18 CT C/A/P revealed 1. Lytic lesion throughout all visualized vertebra from the lower cervical spine through the sacrum. Epidural extension of tumor T2, T3 and L2 level. Right L3-4 foraminal extension tumor. 2. Lytic lesions involving majority of ribs. Multiple pathologic rib fractures with bony expansion/sub pleural extension of tumor at several levels. 3. Lytic lesions of the scapula, hips bilaterally and pelvis bilaterally. With further progression of tumor, patient may be at risk for pathologic fractures. Pathologic fracture right clavicle incompletely assessed. 4. Circumferential narrowing sigmoid colon. Question possibility of underlying mass (series 2, image 97). Alternatively this could represent muscular hypertrophy/peristalsis. 5. Gastric antrum circumferential narrowing. Cannot exclude mass although this may be related to peristalsis. 6. No primary lung malignancy noted. 7. Matted aortic pulmonary window lymph nodes. 8. Circumferential urinary bladder wall thickening greater anterior dome region. Right lateral bladder diverticulum. 9. 5 mm low-density lesion pancreatic body (series 5, image 10) unchanged. This is felt unlikely to be a primary malignancy. 10. Right parapelvic 2.2 cm cyst without significant change. Scattered low-density renal lesions bilaterally too small to adequately characterize. Some were present previously. Statistically these are likely cysts. 11. Stable adrenal gland hyperplasia. 12. Gynecomastia. 13. Prostate gland causes slight impression upon the bladder base. 14.  Aortic Atherosclerosis. 15. Sequential pacemaker in place. 16. Gallstones.   04/23/18 PET/CT revealed Innumerable hypermetabolic lytic lesions throughout the skeleton  especially involving the spine, ribs, bony pelvis along with some involvement of both proximal femurs. The appearance is compatible with pathologic diagnosis of plasma cell neoplasm could well reflect multiple myeloma. Resulting pathologic fractures of the right lateral clavicle and of multiple bilateral ribs. Lesions are present along the cortical margins of the spinal canal. 2. Both the area and the stomach in the area in the sigmoid colon drawn attention to on prior CT scan appear benign normal today. 3. The confluence of AP window lymph nodes measures about 1.2 cm in short axis with maximum SUV 3.1, which is mildly above the blood pool merit surveillance. 4. Other imaging findings of potential clinical significance: Aortic Atherosclerosis. Coronary atherosclerosis. Pacemaker noted. Borderline prostatomegaly. Cholelithiasis.  04/21/18 BM Bx revealed Normocellular bone marrow with Plasma Cell neoplasm. Scattered medium sized clusters of kappa-restricted plasma cells (14% aspirate, 10% CD138 immunohistochemistry.   I do suspect that the burden of disease is underestimated in the bone marrow sample given PET/CT findings of innumerable lytic lesions and an M spike of 3.4g   2. Pathologic right clavicle fracture 3. Hypercalcemia 03/25/18 CMP revealed Calcium at 13.8-- now down to 11.1 Will  begin Xgeva injections, and strongly recommended keeping very well hydrated   PLAN:  -Discussed pt labwork today, 06/08/18; blood counts are stable  -The pt has no prohibitive toxicities from continuing C2D8 Daratumumab and Dexamethasone at this time. -Will recheck Vitamin D levels and will adjust replacement as indicated -Proceed with XR Hips bilaterally as previously ordered -Discussed that the patient's CIDP needs to be considered in the treatment choice -Continue Xgeva every 4 weeks -Again advised that the pt return to care with his orthopedist for consideration of risk stratification of his hip and femur  involvement, and determining need for prophylactic fixation -Again advised that the pt limit his physical stressors, and limit bending, pushing, pulling and stairs to limit risk of further pathologic bone fractures -Continue Tramadol for fracture related pain -Will begin Acyclovir and discussed the risk of developing Shingles -Will see the pt back in 4 weeks    Hip/pelvis Xray after infusion today Continue Xgeva q4weeks Please schedule remaining C2, C3 and C4 of Daratumumab with weekly labs RTC with Dr Irene Limbo in 4 weeks   All of the patients questions were answered with apparent satisfaction. The patient knows to call the clinic with any problems, questions or concerns.  The total time spent in the appt was 25 minutes and more than 50% was on counseling and direct patient cares.    Sullivan Lone MD MS AAHIVMS [facility] Hematology/Oncology Physician [facility]  (Office):       [phone] (Work cell):  [phone] (Fax):           [phone]  06/08/2018 9:28 AM  I, Baldwin Jamaica, am acting as a scribe for Dr. Sullivan Lone.   .I have reviewed the above documentation for accuracy and completeness, and I agree with the above. Brunetta Genera MD

## 2018-06-08 ENCOUNTER — Ambulatory Visit (HOSPITAL_COMMUNITY)
Admission: RE | Admit: 2018-06-08 | Discharge: 2018-06-08 | Disposition: A | Discharge status: Home / Self Care | Payer: Medicare Other | Source: Ambulatory Visit | Attending: Hematology | Admitting: Hematology

## 2018-06-08 ENCOUNTER — Inpatient Hospital Stay (HOSPITAL_BASED_OUTPATIENT_CLINIC_OR_DEPARTMENT_OTHER): Payer: Medicare Other | Admitting: Hematology

## 2018-06-08 ENCOUNTER — Inpatient Hospital Stay: Payer: Medicare Other

## 2018-06-08 VITALS — BP 155/86 | HR 71 | Temp 97.5°F | Resp 18 | Ht 70.0 in | Wt 192.7 lb

## 2018-06-08 VITALS — BP 146/76 | HR 70 | Temp 97.8°F | Resp 18

## 2018-06-08 DIAGNOSIS — Z7982 Long term (current) use of aspirin: Secondary | ICD-10-CM

## 2018-06-08 DIAGNOSIS — R5383 Other fatigue: Secondary | ICD-10-CM

## 2018-06-08 DIAGNOSIS — Z87891 Personal history of nicotine dependence: Secondary | ICD-10-CM

## 2018-06-08 DIAGNOSIS — R0789 Other chest pain: Secondary | ICD-10-CM

## 2018-06-08 DIAGNOSIS — C7951 Secondary malignant neoplasm of bone: Secondary | ICD-10-CM

## 2018-06-08 DIAGNOSIS — C9000 Multiple myeloma not having achieved remission: Secondary | ICD-10-CM | POA: Insufficient documentation

## 2018-06-08 DIAGNOSIS — N62 Hypertrophy of breast: Secondary | ICD-10-CM

## 2018-06-08 DIAGNOSIS — R531 Weakness: Secondary | ICD-10-CM

## 2018-06-08 DIAGNOSIS — M25511 Pain in right shoulder: Secondary | ICD-10-CM | POA: Diagnosis not present

## 2018-06-08 DIAGNOSIS — M899 Disorder of bone, unspecified: Secondary | ICD-10-CM | POA: Diagnosis not present

## 2018-06-08 DIAGNOSIS — Z950 Presence of cardiac pacemaker: Secondary | ICD-10-CM

## 2018-06-08 DIAGNOSIS — M545 Low back pain: Secondary | ICD-10-CM | POA: Diagnosis not present

## 2018-06-08 DIAGNOSIS — D649 Anemia, unspecified: Secondary | ICD-10-CM | POA: Diagnosis not present

## 2018-06-08 DIAGNOSIS — I5032 Chronic diastolic (congestive) heart failure: Secondary | ICD-10-CM

## 2018-06-08 DIAGNOSIS — N323 Diverticulum of bladder: Secondary | ICD-10-CM

## 2018-06-08 DIAGNOSIS — Z79899 Other long term (current) drug therapy: Secondary | ICD-10-CM

## 2018-06-08 DIAGNOSIS — Z5112 Encounter for antineoplastic immunotherapy: Secondary | ICD-10-CM | POA: Diagnosis not present

## 2018-06-08 DIAGNOSIS — G6181 Chronic inflammatory demyelinating polyneuritis: Secondary | ICD-10-CM | POA: Diagnosis not present

## 2018-06-08 DIAGNOSIS — R634 Abnormal weight loss: Secondary | ICD-10-CM

## 2018-06-08 DIAGNOSIS — Z7189 Other specified counseling: Secondary | ICD-10-CM

## 2018-06-08 DIAGNOSIS — N400 Enlarged prostate without lower urinary tract symptoms: Secondary | ICD-10-CM

## 2018-06-08 DIAGNOSIS — I2510 Atherosclerotic heart disease of native coronary artery without angina pectoris: Secondary | ICD-10-CM

## 2018-06-08 DIAGNOSIS — I110 Hypertensive heart disease with heart failure: Secondary | ICD-10-CM

## 2018-06-08 LAB — CMP (CANCER CENTER ONLY)
ALT: 14 U/L (ref 0–44)
AST: 13 U/L — AB (ref 15–41)
Albumin: 3.4 g/dL — ABNORMAL LOW (ref 3.5–5.0)
Alkaline Phosphatase: 240 U/L — ABNORMAL HIGH (ref 38–126)
Anion gap: 7 (ref 5–15)
BUN: 14 mg/dL (ref 8–23)
CO2: 24 mmol/L (ref 22–32)
Calcium: 8.4 mg/dL — ABNORMAL LOW (ref 8.9–10.3)
Chloride: 108 mmol/L (ref 98–111)
Creatinine: 0.97 mg/dL (ref 0.61–1.24)
GFR, Est AFR Am: >60 mL/min (ref >60)
Glucose, Bld: 96 mg/dL (ref 70–99)
Potassium: 4.6 mmol/L (ref 3.5–5.1)
Sodium: 139 mmol/L (ref 135–145)
TOTAL PROTEIN: 6.5 g/dL (ref 6.5–8.1)
Total Bilirubin: 0.5 mg/dL (ref 0.3–1.2)

## 2018-06-08 LAB — CBC WITH DIFFERENTIAL/PLATELET
Abs Immature Granulocytes: 0.01 10*3/uL (ref 0.00–0.07)
BASOS PCT: 0 %
Basophils Absolute: 0 10*3/uL (ref 0.0–0.1)
Eosinophils Absolute: 0.3 10*3/uL (ref 0.0–0.5)
Eosinophils Relative: 5 %
HCT: 36.1 % — ABNORMAL LOW (ref 39.0–52.0)
HEMOGLOBIN: 11.7 g/dL — AB (ref 13.0–17.0)
Immature Granulocytes: 0 %
Lymphocytes Relative: 36 %
Lymphs Abs: 1.8 10*3/uL (ref 0.7–4.0)
MCH: 27 pg (ref 26.0–34.0)
MCHC: 32.4 g/dL (ref 30.0–36.0)
MCV: 83.4 fL (ref 80.0–100.0)
Monocytes Absolute: 0.5 10*3/uL (ref 0.1–1.0)
Monocytes Relative: 11 %
Neutro Abs: 2.3 10*3/uL (ref 1.7–7.7)
Neutrophils Relative %: 48 %
Platelets: 146 10*3/uL — ABNORMAL LOW (ref 150–400)
RBC: 4.33 MIL/uL (ref 4.22–5.81)
RDW: 16.2 % — ABNORMAL HIGH (ref 11.5–15.5)
WBC: 4.9 10*3/uL (ref 4.0–10.5)
nRBC: 0 % (ref 0.0–0.2)

## 2018-06-08 MED ORDER — MONTELUKAST SODIUM 10 MG PO TABS
ORAL_TABLET | ORAL | Status: AC
  Filled 2018-06-08: qty 1

## 2018-06-08 MED ORDER — SODIUM CHLORIDE 0.9 % IV SOLN
20.0000 mg | Freq: Once | INTRAVENOUS | Status: AC
  Administered 2018-06-08: 20 mg via INTRAVENOUS
  Filled 2018-06-08: qty 2

## 2018-06-08 MED ORDER — ACETAMINOPHEN 325 MG PO TABS
650.0000 mg | ORAL_TABLET | Freq: Once | ORAL | Status: AC
  Administered 2018-06-08: 650 mg via ORAL

## 2018-06-08 MED ORDER — DIPHENHYDRAMINE HCL 25 MG PO CAPS
ORAL_CAPSULE | ORAL | Status: AC
  Filled 2018-06-08: qty 2

## 2018-06-08 MED ORDER — MONTELUKAST SODIUM 10 MG PO TABS
10.0000 mg | ORAL_TABLET | Freq: Once | ORAL | Status: AC
  Administered 2018-06-08: 10 mg via ORAL

## 2018-06-08 MED ORDER — DIPHENHYDRAMINE HCL 25 MG PO CAPS
50.0000 mg | ORAL_CAPSULE | Freq: Once | ORAL | Status: AC
  Administered 2018-06-08: 50 mg via ORAL

## 2018-06-08 MED ORDER — ACETAMINOPHEN 325 MG PO TABS
ORAL_TABLET | ORAL | Status: AC
  Filled 2018-06-08: qty 2

## 2018-06-08 MED ORDER — SODIUM CHLORIDE 0.9 % IV SOLN
16.2000 mg/kg | Freq: Once | INTRAVENOUS | Status: AC
  Administered 2018-06-08: 1400 mg via INTRAVENOUS
  Filled 2018-06-08: qty 60

## 2018-06-08 MED ORDER — SODIUM CHLORIDE 0.9 % IV SOLN
Freq: Once | INTRAVENOUS | Status: AC
  Administered 2018-06-08: 10:00:00 via INTRAVENOUS
  Filled 2018-06-08: qty 250

## 2018-06-08 MED ORDER — FAMOTIDINE IN NACL 20-0.9 MG/50ML-% IV SOLN
INTRAVENOUS | Status: AC
  Filled 2018-06-08: qty 50

## 2018-06-08 MED ORDER — FAMOTIDINE IN NACL 20-0.9 MG/50ML-% IV SOLN
20.0000 mg | Freq: Once | INTRAVENOUS | Status: AC
  Administered 2018-06-08: 20 mg via INTRAVENOUS

## 2018-06-08 NOTE — Patient Instructions (Signed)
Thank you for choosing [facility] to provide your oncology and hematology care.  To afford each patient quality time with our providers, please arrive 30 minutes before your scheduled appointment time.  If you arrive late for your appointment, you may be asked to reschedule.  We strive to give you quality time with our providers, and arriving late affects you and other patients whose appointments are after yours.    If you are a no show for multiple scheduled visits, you may be dismissed from the clinic at the providers discretion.     Again, thank you for choosing [facility], our hope is that these requests will decrease the amount of time that you wait before being seen by our physicians.  ______________________________________________________________________   Should you have questions after your visit to the [facility], please contact our office at [phone] between the hours of 8:30 and 4:30 p.m.    Voicemails left after 4:30p.m will not be returned until the following business day.     For prescription refill requests, please have your pharmacy contact us directly.  Please also try to allow 48 hours for prescription requests.     Please contact the scheduling department for questions regarding scheduling.  For scheduling of procedures such as PET scans, CT scans, MRI, Ultrasound, etc please contact central scheduling at [phone].     Resources For Cancer Patients and Caregivers:    Oncolink.org:  A wonderful resource for patients and healthcare providers for information regarding your disease, ways to tract your treatment, what to expect, etc.      American Cancer Society:  800-227-2345  Can help patients locate various types of support and financial assistance   Cancer Care: 1-800-813-HOPE (4673) Provides financial assistance, online support groups, medication/co-pay assistance.     [place] DSS:  336-641-3447 Where to apply  for food stamps, Medicaid, and utility assistance   Medicare Rights Center: 800-333-4114 Helps people with Medicare understand their rights and benefits, navigate the Medicare system, and secure the quality healthcare they deserve   SCAT: 336-333-6589  [employer]'s shared-ride transportation service for eligible riders who have a disability that prevents them from riding the fixed route bus.     For additional information on assistance programs please contact our social worker:   Abigail Elmore:  [phone]  

## 2018-06-08 NOTE — Patient Instructions (Signed)
[facility] Discharge Instructions for Patients Receiving Chemotherapy  Today you received the following chemotherapy agents :  Daratumumab.  To help prevent nausea and vomiting after your treatment, we encourage you to take your nausea medication as prescribed.   If you develop nausea and vomiting that is not controlled by your nausea medication, call the clinic.   BELOW ARE SYMPTOMS THAT SHOULD BE REPORTED IMMEDIATELY:  *FEVER GREATER THAN 100.5 F  *CHILLS WITH OR WITHOUT FEVER  NAUSEA AND VOMITING THAT IS NOT CONTROLLED WITH YOUR NAUSEA MEDICATION  *UNUSUAL SHORTNESS OF BREATH  *UNUSUAL BRUISING OR BLEEDING  TENDERNESS IN MOUTH AND THROAT WITH OR WITHOUT PRESENCE OF ULCERS  *URINARY PROBLEMS  *BOWEL PROBLEMS  UNUSUAL RASH Items with * indicate a potential emergency and should be followed up as soon as possible.  Feel free to call the clinic should you have any questions or concerns. The clinic phone number is [phone].  Please show the CHEMO ALERT CARD at check-in to the Emergency Department and triage nurse.   

## 2018-06-10 ENCOUNTER — Telehealth: Payer: Self-pay

## 2018-06-10 ENCOUNTER — Other Ambulatory Visit: Payer: Self-pay | Admitting: Hematology

## 2018-06-10 NOTE — Telephone Encounter (Signed)
Spoke with patient daughter concerning some additional appointments added to his current schedule will pick on Monday after  Infusion. Per 1/27 los

## 2018-06-15 ENCOUNTER — Inpatient Hospital Stay: Payer: Medicare Other

## 2018-06-15 ENCOUNTER — Inpatient Hospital Stay: Payer: Medicare Other | Attending: Hematology

## 2018-06-15 ENCOUNTER — Other Ambulatory Visit: Payer: Self-pay | Admitting: Hematology

## 2018-06-15 VITALS — BP 145/81 | HR 68 | Temp 98.0°F | Resp 18

## 2018-06-15 DIAGNOSIS — Z950 Presence of cardiac pacemaker: Secondary | ICD-10-CM | POA: Insufficient documentation

## 2018-06-15 DIAGNOSIS — Z7982 Long term (current) use of aspirin: Secondary | ICD-10-CM | POA: Insufficient documentation

## 2018-06-15 DIAGNOSIS — Z803 Family history of malignant neoplasm of breast: Secondary | ICD-10-CM | POA: Diagnosis not present

## 2018-06-15 DIAGNOSIS — Z8041 Family history of malignant neoplasm of ovary: Secondary | ICD-10-CM | POA: Insufficient documentation

## 2018-06-15 DIAGNOSIS — C7951 Secondary malignant neoplasm of bone: Secondary | ICD-10-CM | POA: Insufficient documentation

## 2018-06-15 DIAGNOSIS — Z79899 Other long term (current) drug therapy: Secondary | ICD-10-CM | POA: Insufficient documentation

## 2018-06-15 DIAGNOSIS — R531 Weakness: Secondary | ICD-10-CM | POA: Diagnosis not present

## 2018-06-15 DIAGNOSIS — Z7189 Other specified counseling: Secondary | ICD-10-CM

## 2018-06-15 DIAGNOSIS — Z5112 Encounter for antineoplastic immunotherapy: Secondary | ICD-10-CM | POA: Insufficient documentation

## 2018-06-15 DIAGNOSIS — E559 Vitamin D deficiency, unspecified: Secondary | ICD-10-CM

## 2018-06-15 DIAGNOSIS — C9000 Multiple myeloma not having achieved remission: Secondary | ICD-10-CM | POA: Insufficient documentation

## 2018-06-15 DIAGNOSIS — I110 Hypertensive heart disease with heart failure: Secondary | ICD-10-CM | POA: Insufficient documentation

## 2018-06-15 DIAGNOSIS — Z87891 Personal history of nicotine dependence: Secondary | ICD-10-CM | POA: Insufficient documentation

## 2018-06-15 DIAGNOSIS — R0789 Other chest pain: Secondary | ICD-10-CM | POA: Insufficient documentation

## 2018-06-15 DIAGNOSIS — M545 Low back pain: Secondary | ICD-10-CM | POA: Insufficient documentation

## 2018-06-15 DIAGNOSIS — R5383 Other fatigue: Secondary | ICD-10-CM | POA: Diagnosis not present

## 2018-06-15 DIAGNOSIS — I5032 Chronic diastolic (congestive) heart failure: Secondary | ICD-10-CM | POA: Diagnosis not present

## 2018-06-15 LAB — CBC WITH DIFFERENTIAL/PLATELET
ABS IMMATURE GRANULOCYTES: 0.01 10*3/uL (ref 0.00–0.07)
Basophils Absolute: 0 10*3/uL (ref 0.0–0.1)
Basophils Relative: 0 %
Eosinophils Absolute: 0.2 10*3/uL (ref 0.0–0.5)
Eosinophils Relative: 5 %
HCT: 37.4 % — ABNORMAL LOW (ref 39.0–52.0)
Hemoglobin: 11.9 g/dL — ABNORMAL LOW (ref 13.0–17.0)
Immature Granulocytes: 0 %
Lymphocytes Relative: 43 %
Lymphs Abs: 2.1 10*3/uL (ref 0.7–4.0)
MCH: 26.9 pg (ref 26.0–34.0)
MCHC: 31.8 g/dL (ref 30.0–36.0)
MCV: 84.6 fL (ref 80.0–100.0)
MONOS PCT: 11 %
Monocytes Absolute: 0.5 10*3/uL (ref 0.1–1.0)
Neutro Abs: 2 10*3/uL (ref 1.7–7.7)
Neutrophils Relative %: 41 %
Platelets: 144 10*3/uL — ABNORMAL LOW (ref 150–400)
RBC: 4.42 MIL/uL (ref 4.22–5.81)
RDW: 16.4 % — ABNORMAL HIGH (ref 11.5–15.5)
WBC: 4.9 10*3/uL (ref 4.0–10.5)
nRBC: 0 % (ref 0.0–0.2)

## 2018-06-15 LAB — COMPREHENSIVE METABOLIC PANEL
ALBUMIN: 3.4 g/dL — AB (ref 3.5–5.0)
ALT: 16 U/L (ref 0–44)
AST: 15 U/L (ref 15–41)
Alkaline Phosphatase: 217 U/L — ABNORMAL HIGH (ref 38–126)
Anion gap: 7 (ref 5–15)
BUN: 17 mg/dL (ref 8–23)
CHLORIDE: 109 mmol/L (ref 98–111)
CO2: 25 mmol/L (ref 22–32)
Calcium: 8.2 mg/dL — ABNORMAL LOW (ref 8.9–10.3)
Creatinine, Ser: 0.96 mg/dL (ref 0.61–1.24)
GFR calc Af Amer: >60 mL/min (ref >60)
GFR calc non Af Amer: >60 mL/min (ref >60)
GLUCOSE: 90 mg/dL (ref 70–99)
Potassium: 4.2 mmol/L (ref 3.5–5.1)
Sodium: 141 mmol/L (ref 135–145)
Total Bilirubin: 0.6 mg/dL (ref 0.3–1.2)
Total Protein: 6.4 g/dL — ABNORMAL LOW (ref 6.5–8.1)

## 2018-06-15 MED ORDER — MONTELUKAST SODIUM 10 MG PO TABS
10.0000 mg | ORAL_TABLET | Freq: Once | ORAL | Status: AC
  Administered 2018-06-15: 10 mg via ORAL

## 2018-06-15 MED ORDER — DENOSUMAB 120 MG/1.7ML ~~LOC~~ SOLN
120.0000 mg | Freq: Once | SUBCUTANEOUS | Status: AC
  Administered 2018-06-15: 120 mg via SUBCUTANEOUS

## 2018-06-15 MED ORDER — FAMOTIDINE IN NACL 20-0.9 MG/50ML-% IV SOLN
INTRAVENOUS | Status: AC
  Filled 2018-06-15: qty 50

## 2018-06-15 MED ORDER — DENOSUMAB 120 MG/1.7ML ~~LOC~~ SOLN
SUBCUTANEOUS | Status: AC
  Filled 2018-06-15: qty 1.7

## 2018-06-15 MED ORDER — SODIUM CHLORIDE 0.9 % IV SOLN
Freq: Once | INTRAVENOUS | Status: AC
  Administered 2018-06-15: 10:00:00 via INTRAVENOUS
  Filled 2018-06-15: qty 250

## 2018-06-15 MED ORDER — ACETAMINOPHEN 325 MG PO TABS
650.0000 mg | ORAL_TABLET | Freq: Once | ORAL | Status: AC
  Administered 2018-06-15: 650 mg via ORAL

## 2018-06-15 MED ORDER — SODIUM CHLORIDE 0.9 % IV SOLN
16.2000 mg/kg | Freq: Once | INTRAVENOUS | Status: AC
  Administered 2018-06-15: 1400 mg via INTRAVENOUS
  Filled 2018-06-15: qty 60

## 2018-06-15 MED ORDER — ACETAMINOPHEN 325 MG PO TABS
ORAL_TABLET | ORAL | Status: AC
  Filled 2018-06-15: qty 2

## 2018-06-15 MED ORDER — CALCIUM GLUCONATE 500 MG PO TABS: 1.0000 | ORAL_TABLET | Freq: Every day | ORAL | 0 refills | Status: DC

## 2018-06-15 MED ORDER — MONTELUKAST SODIUM 10 MG PO TABS
ORAL_TABLET | ORAL | Status: AC
  Filled 2018-06-15: qty 1

## 2018-06-15 MED ORDER — DIPHENHYDRAMINE HCL 25 MG PO CAPS
50.0000 mg | ORAL_CAPSULE | Freq: Once | ORAL | Status: AC
  Administered 2018-06-15: 50 mg via ORAL

## 2018-06-15 MED ORDER — DIPHENHYDRAMINE HCL 25 MG PO CAPS
ORAL_CAPSULE | ORAL | Status: AC
  Filled 2018-06-15: qty 2

## 2018-06-15 MED ORDER — SODIUM CHLORIDE 0.9 % IV SOLN
20.0000 mg | Freq: Once | INTRAVENOUS | Status: AC
  Administered 2018-06-15: 20 mg via INTRAVENOUS
  Filled 2018-06-15: qty 2

## 2018-06-15 MED ORDER — FAMOTIDINE IN NACL 20-0.9 MG/50ML-% IV SOLN
20.0000 mg | Freq: Once | INTRAVENOUS | Status: AC
  Administered 2018-06-15: 20 mg via INTRAVENOUS

## 2018-06-15 NOTE — Progress Notes (Signed)
OK to receive xgeva today per MD Irene Limbo - will advise patient of proper OTC dose of Calcium with discharge today

## 2018-06-15 NOTE — Patient Instructions (Addendum)
[facility] Discharge Instructions for Patients Receiving Chemotherapy  Today you received the following chemotherapy agents :  Daratumumab,  Delton See.  To help prevent nausea and vomiting after your treatment, we encourage you to take your nausea medication as prescribed.  TAKE CALCIUM SUPPLEMENT DAILY -  500 mg twice a day - get this over the counter   If you develop nausea and vomiting that is not controlled by your nausea medication, call the clinic.   BELOW ARE SYMPTOMS THAT SHOULD BE REPORTED IMMEDIATELY:  *FEVER GREATER THAN 100.5 F  *CHILLS WITH OR WITHOUT FEVER  NAUSEA AND VOMITING THAT IS NOT CONTROLLED WITH YOUR NAUSEA MEDICATION  *UNUSUAL SHORTNESS OF BREATH  *UNUSUAL BRUISING OR BLEEDING  TENDERNESS IN MOUTH AND THROAT WITH OR WITHOUT PRESENCE OF ULCERS  *URINARY PROBLEMS  *BOWEL PROBLEMS  UNUSUAL RASH Items with * indicate a potential emergency and should be followed up as soon as possible.  Feel free to call the clinic should you have any questions or concerns. The clinic phone number is [phone].  Please show the [place] at check-in to the Emergency Department and triage nurse.

## 2018-06-16 ENCOUNTER — Telehealth: Payer: Self-pay | Admitting: *Deleted

## 2018-06-16 ENCOUNTER — Telehealth: Payer: Self-pay | Admitting: Family Medicine

## 2018-06-16 NOTE — Telephone Encounter (Signed)
Please Advise

## 2018-06-16 NOTE — Telephone Encounter (Signed)
Received faxed order for med change authorization from [facility]. Dr. Irene Limbo d/c'd D3 50,000 units/tablet, give 1 tablet po q7days. Started D2 (ergocalciferol) 50,000 units/tablet, give 1 tablet q7days. Faxed to facility - fax confirmation received.

## 2018-06-16 NOTE — Telephone Encounter (Signed)
Copied from [place] [id number]. Topic: Quick Communication - See Telephone Encounter >> Jun 16, 2018 12:55 PM Bea Graff, NT wrote: CRM for notification. See Telephone encounter for: 06/16/18. [place] with [facility] needs clarification on how much of the vitamin B-12 (CYANOCOBALAMIN) 500 MCG tablet the pt should be taking. CB#: [phone]

## 2018-06-16 NOTE — Telephone Encounter (Signed)
Vitamin B12 1000 mcg (2 of the 500 mcg pills) daily.

## 2018-06-17 ENCOUNTER — Telehealth: Payer: Self-pay | Admitting: *Deleted

## 2018-06-17 NOTE — Telephone Encounter (Signed)
Per Dr. Irene Limbo directions, contacted patient/family to inform them: Multiple Myeloma Panel results: M spike has come down significantly, showing very good partial response with current treatment. No additional drugs to be added at this time, treatment will continue the same. Daughter verbalized understanding and states she will inform her father.

## 2018-06-17 NOTE — Telephone Encounter (Signed)
I called Jonathon [name] and informed her of the message below.  She stated she was confused as the [facility] stated the pt was to have extended release?  I advised she send a note/order form via fax and she agreed.

## 2018-06-19 ENCOUNTER — Telehealth: Payer: Self-pay | Admitting: *Deleted

## 2018-06-19 NOTE — Telephone Encounter (Signed)
Dr. Irene Limbo original order for calcium gluconate 500 mg qd faxed to facility 06/15/2018 could not be filled by facility pharmacy. They requested prescription for calcium carbonate 500 mg q day and that order be sent to hold calcium gluconate which was not available from 06/15/25. Dr. Irene Limbo ordered all calcium held and sent order to have patient drink 12 oz whole milk daily. New order faxed to [phone]. Fax confirmation received.

## 2018-06-22 ENCOUNTER — Inpatient Hospital Stay: Payer: Medicare Other

## 2018-06-22 VITALS — BP 138/92 | HR 72 | Temp 97.6°F | Resp 17

## 2018-06-22 DIAGNOSIS — C7951 Secondary malignant neoplasm of bone: Secondary | ICD-10-CM

## 2018-06-22 DIAGNOSIS — R0789 Other chest pain: Secondary | ICD-10-CM | POA: Diagnosis not present

## 2018-06-22 DIAGNOSIS — Z7189 Other specified counseling: Secondary | ICD-10-CM

## 2018-06-22 DIAGNOSIS — C9000 Multiple myeloma not having achieved remission: Secondary | ICD-10-CM | POA: Diagnosis not present

## 2018-06-22 DIAGNOSIS — M545 Low back pain: Secondary | ICD-10-CM | POA: Diagnosis not present

## 2018-06-22 DIAGNOSIS — R531 Weakness: Secondary | ICD-10-CM | POA: Diagnosis not present

## 2018-06-22 DIAGNOSIS — Z5112 Encounter for antineoplastic immunotherapy: Secondary | ICD-10-CM | POA: Diagnosis not present

## 2018-06-22 LAB — CBC WITH DIFFERENTIAL (CANCER CENTER ONLY)
Abs Immature Granulocytes: 0.01 10*3/uL (ref 0.00–0.07)
BASOS ABS: 0 10*3/uL (ref 0.0–0.1)
Basophils Relative: 0 %
Eosinophils Absolute: 0.2 10*3/uL (ref 0.0–0.5)
Eosinophils Relative: 5 %
HCT: 39.7 % (ref 39.0–52.0)
Hemoglobin: 12.8 g/dL — ABNORMAL LOW (ref 13.0–17.0)
Immature Granulocytes: 0 %
Lymphocytes Relative: 33 %
Lymphs Abs: 1.7 10*3/uL (ref 0.7–4.0)
MCH: 27.2 pg (ref 26.0–34.0)
MCHC: 32.2 g/dL (ref 30.0–36.0)
MCV: 84.3 fL (ref 80.0–100.0)
Monocytes Absolute: 0.5 10*3/uL (ref 0.1–1.0)
Monocytes Relative: 10 %
Neutro Abs: 2.6 10*3/uL (ref 1.7–7.7)
Neutrophils Relative %: 52 %
PLATELETS: 146 10*3/uL — AB (ref 150–400)
RBC: 4.71 MIL/uL (ref 4.22–5.81)
RDW: 16.6 % — AB (ref 11.5–15.5)
WBC Count: 5.1 10*3/uL (ref 4.0–10.5)
nRBC: 0 % (ref 0.0–0.2)

## 2018-06-22 LAB — TYPE AND SCREEN
ABO/RH(D): B POS
Antibody Screen: POSITIVE

## 2018-06-22 LAB — CMP (CANCER CENTER ONLY)
ALT: 19 U/L (ref 0–44)
AST: 19 U/L (ref 15–41)
Albumin: 3.7 g/dL (ref 3.5–5.0)
Alkaline Phosphatase: 205 U/L — ABNORMAL HIGH (ref 38–126)
Anion gap: 11 (ref 5–15)
BUN: 13 mg/dL (ref 8–23)
CO2: 24 mmol/L (ref 22–32)
Calcium: 8.8 mg/dL — ABNORMAL LOW (ref 8.9–10.3)
Chloride: 106 mmol/L (ref 98–111)
Creatinine: 0.99 mg/dL (ref 0.61–1.24)
GFR, Estimated: >60 mL/min (ref >60)
Glucose, Bld: 98 mg/dL (ref 70–99)
Potassium: 4.5 mmol/L (ref 3.5–5.1)
Sodium: 141 mmol/L (ref 135–145)
TOTAL PROTEIN: 6.9 g/dL (ref 6.5–8.1)
Total Bilirubin: 0.9 mg/dL (ref 0.3–1.2)

## 2018-06-22 MED ORDER — DIPHENHYDRAMINE HCL 25 MG PO CAPS
ORAL_CAPSULE | ORAL | Status: AC
  Filled 2018-06-22: qty 2

## 2018-06-22 MED ORDER — DIPHENHYDRAMINE HCL 25 MG PO CAPS
50.0000 mg | ORAL_CAPSULE | Freq: Once | ORAL | Status: AC
  Administered 2018-06-22: 50 mg via ORAL

## 2018-06-22 MED ORDER — SODIUM CHLORIDE 0.9 % IV SOLN: 20.0000 mg | Freq: Once | INTRAVENOUS | Status: DC

## 2018-06-22 MED ORDER — SODIUM CHLORIDE 0.9 % IV SOLN
20.0000 mg | Freq: Once | INTRAVENOUS | Status: AC
  Administered 2018-06-22: 20 mg via INTRAVENOUS
  Filled 2018-06-22: qty 2

## 2018-06-22 MED ORDER — MONTELUKAST SODIUM 10 MG PO TABS
10.0000 mg | ORAL_TABLET | Freq: Once | ORAL | Status: AC
  Administered 2018-06-22: 10 mg via ORAL

## 2018-06-22 MED ORDER — ACETAMINOPHEN 325 MG PO TABS
650.0000 mg | ORAL_TABLET | Freq: Once | ORAL | Status: AC
  Administered 2018-06-22: 650 mg via ORAL

## 2018-06-22 MED ORDER — FAMOTIDINE IN NACL 20-0.9 MG/50ML-% IV SOLN
INTRAVENOUS | Status: AC
  Filled 2018-06-22: qty 50

## 2018-06-22 MED ORDER — ACETAMINOPHEN 325 MG PO TABS
ORAL_TABLET | ORAL | Status: AC
  Filled 2018-06-22: qty 2

## 2018-06-22 MED ORDER — FAMOTIDINE IN NACL 20-0.9 MG/50ML-% IV SOLN
20.0000 mg | Freq: Once | INTRAVENOUS | Status: AC
  Administered 2018-06-22: 20 mg via INTRAVENOUS

## 2018-06-22 MED ORDER — SODIUM CHLORIDE 0.9 % IV SOLN
16.2000 mg/kg | Freq: Once | INTRAVENOUS | Status: AC
  Administered 2018-06-22: 1400 mg via INTRAVENOUS
  Filled 2018-06-22: qty 60

## 2018-06-22 MED ORDER — MONTELUKAST SODIUM 10 MG PO TABS
ORAL_TABLET | ORAL | Status: AC
  Filled 2018-06-22: qty 1

## 2018-06-22 MED ORDER — SODIUM CHLORIDE 0.9 % IV SOLN
Freq: Once | INTRAVENOUS | Status: AC
  Administered 2018-06-22: 10:00:00 via INTRAVENOUS
  Filled 2018-06-22: qty 250

## 2018-06-22 NOTE — Patient Instructions (Signed)
[facility] Discharge Instructions for Patients Receiving Chemotherapy  Today you received the following chemotherapy agents :  Daratumumab.  To help prevent nausea and vomiting after your treatment, we encourage you to take your nausea medication as prescribed.   If you develop nausea and vomiting that is not controlled by your nausea medication, call the clinic.   BELOW ARE SYMPTOMS THAT SHOULD BE REPORTED IMMEDIATELY:  *FEVER GREATER THAN 100.5 F  *CHILLS WITH OR WITHOUT FEVER  NAUSEA AND VOMITING THAT IS NOT CONTROLLED WITH YOUR NAUSEA MEDICATION  *UNUSUAL SHORTNESS OF BREATH  *UNUSUAL BRUISING OR BLEEDING  TENDERNESS IN MOUTH AND THROAT WITH OR WITHOUT PRESENCE OF ULCERS  *URINARY PROBLEMS  *BOWEL PROBLEMS  UNUSUAL RASH Items with * indicate a potential emergency and should be followed up as soon as possible.  Feel free to call the clinic should you have any questions or concerns. The clinic phone number is [phone].  Please show the CHEMO ALERT CARD at check-in to the Emergency Department and triage nurse.   

## 2018-06-24 ENCOUNTER — Telehealth: Payer: Self-pay

## 2018-06-24 LAB — MULTIPLE MYELOMA PANEL, SERUM
Albumin SerPl Elph-Mcnc: 3.4 g/dL (ref 2.9–4.4)
Albumin/Glob SerPl: 1.2 (ref 0.7–1.7)
Alpha 1: 0.3 g/dL (ref 0.0–0.4)
Alpha2 Glob SerPl Elph-Mcnc: 1 g/dL (ref 0.4–1.0)
B-GLOBULIN SERPL ELPH-MCNC: 1.1 g/dL (ref 0.7–1.3)
Gamma Glob SerPl Elph-Mcnc: 0.7 g/dL (ref 0.4–1.8)
Globulin, Total: 3 g/dL (ref 2.2–3.9)
IgA: 212 mg/dL (ref 61–437)
IgG (Immunoglobin G), Serum: 768 mg/dL (ref 700–1600)
IgM (Immunoglobulin M), Srm: 52 mg/dL (ref 15–143)
M PROTEIN SERPL ELPH-MCNC: 0.2 g/dL — AB
Total Protein ELP: 6.4 g/dL (ref 6.0–8.5)

## 2018-06-24 NOTE — Telephone Encounter (Signed)
Pt request to D/C vit B12 extended release 1041mcg was sent to [facility] point.

## 2018-06-29 ENCOUNTER — Inpatient Hospital Stay: Payer: Medicare Other

## 2018-06-29 VITALS — BP 144/86 | HR 73 | Temp 97.8°F | Resp 16

## 2018-06-29 DIAGNOSIS — Z7189 Other specified counseling: Secondary | ICD-10-CM

## 2018-06-29 DIAGNOSIS — C9000 Multiple myeloma not having achieved remission: Secondary | ICD-10-CM

## 2018-06-29 DIAGNOSIS — R531 Weakness: Secondary | ICD-10-CM | POA: Diagnosis not present

## 2018-06-29 DIAGNOSIS — C7951 Secondary malignant neoplasm of bone: Secondary | ICD-10-CM | POA: Diagnosis not present

## 2018-06-29 DIAGNOSIS — R0789 Other chest pain: Secondary | ICD-10-CM | POA: Diagnosis not present

## 2018-06-29 DIAGNOSIS — M545 Low back pain: Secondary | ICD-10-CM | POA: Diagnosis not present

## 2018-06-29 DIAGNOSIS — Z5112 Encounter for antineoplastic immunotherapy: Secondary | ICD-10-CM | POA: Diagnosis not present

## 2018-06-29 LAB — CMP (CANCER CENTER ONLY)
ALT: 16 U/L (ref 0–44)
AST: 17 U/L (ref 15–41)
Albumin: 3.7 g/dL (ref 3.5–5.0)
Alkaline Phosphatase: 169 U/L — ABNORMAL HIGH (ref 38–126)
Anion gap: 10 (ref 5–15)
BILIRUBIN TOTAL: 0.6 mg/dL (ref 0.3–1.2)
BUN: 16 mg/dL (ref 8–23)
CO2: 23 mmol/L (ref 22–32)
Calcium: 8.8 mg/dL — ABNORMAL LOW (ref 8.9–10.3)
Chloride: 108 mmol/L (ref 98–111)
Creatinine: 1.02 mg/dL (ref 0.61–1.24)
GFR, Estimated: >60 mL/min (ref >60)
Glucose, Bld: 99 mg/dL (ref 70–99)
Potassium: 4.4 mmol/L (ref 3.5–5.1)
Sodium: 141 mmol/L (ref 135–145)
TOTAL PROTEIN: 6.9 g/dL (ref 6.5–8.1)

## 2018-06-29 LAB — CBC WITH DIFFERENTIAL/PLATELET
ABS IMMATURE GRANULOCYTES: 0.02 10*3/uL (ref 0.00–0.07)
Basophils Absolute: 0 10*3/uL (ref 0.0–0.1)
Basophils Relative: 1 %
Eosinophils Absolute: 0.3 10*3/uL (ref 0.0–0.5)
Eosinophils Relative: 5 %
HCT: 40.4 % (ref 39.0–52.0)
Hemoglobin: 12.8 g/dL — ABNORMAL LOW (ref 13.0–17.0)
IMMATURE GRANULOCYTES: 0 %
Lymphocytes Relative: 37 %
Lymphs Abs: 2.2 10*3/uL (ref 0.7–4.0)
MCH: 27 pg (ref 26.0–34.0)
MCHC: 31.7 g/dL (ref 30.0–36.0)
MCV: 85.2 fL (ref 80.0–100.0)
Monocytes Absolute: 0.6 10*3/uL (ref 0.1–1.0)
Monocytes Relative: 10 %
Neutro Abs: 2.8 10*3/uL (ref 1.7–7.7)
Neutrophils Relative %: 47 %
Platelets: 126 10*3/uL — ABNORMAL LOW (ref 150–400)
RBC: 4.74 MIL/uL (ref 4.22–5.81)
RDW: 16.8 % — ABNORMAL HIGH (ref 11.5–15.5)
WBC: 5.9 10*3/uL (ref 4.0–10.5)
nRBC: 0 % (ref 0.0–0.2)

## 2018-06-29 MED ORDER — FAMOTIDINE IN NACL 20-0.9 MG/50ML-% IV SOLN
INTRAVENOUS | Status: AC
  Filled 2018-06-29: qty 50

## 2018-06-29 MED ORDER — MONTELUKAST SODIUM 10 MG PO TABS
10.0000 mg | ORAL_TABLET | Freq: Once | ORAL | Status: AC
  Administered 2018-06-29: 10 mg via ORAL

## 2018-06-29 MED ORDER — SODIUM CHLORIDE 0.9 % IV SOLN
16.2000 mg/kg | Freq: Once | INTRAVENOUS | Status: AC
  Administered 2018-06-29: 1400 mg via INTRAVENOUS
  Filled 2018-06-29: qty 60

## 2018-06-29 MED ORDER — SODIUM CHLORIDE 0.9 % IV SOLN
20.0000 mg | Freq: Once | INTRAVENOUS | Status: AC
  Administered 2018-06-29: 20 mg via INTRAVENOUS
  Filled 2018-06-29: qty 20

## 2018-06-29 MED ORDER — SODIUM CHLORIDE 0.9 % IV SOLN
Freq: Once | INTRAVENOUS | Status: AC
  Administered 2018-06-29: 11:00:00 via INTRAVENOUS
  Filled 2018-06-29: qty 250

## 2018-06-29 MED ORDER — MONTELUKAST SODIUM 10 MG PO TABS
ORAL_TABLET | ORAL | Status: AC
  Filled 2018-06-29: qty 1

## 2018-06-29 MED ORDER — FAMOTIDINE IN NACL 20-0.9 MG/50ML-% IV SOLN
20.0000 mg | Freq: Once | INTRAVENOUS | Status: AC
  Administered 2018-06-29: 20 mg via INTRAVENOUS

## 2018-06-29 MED ORDER — DIPHENHYDRAMINE HCL 25 MG PO CAPS
50.0000 mg | ORAL_CAPSULE | Freq: Once | ORAL | Status: AC
  Administered 2018-06-29: 50 mg via ORAL

## 2018-06-29 MED ORDER — ACETAMINOPHEN 325 MG PO TABS
650.0000 mg | ORAL_TABLET | Freq: Once | ORAL | Status: AC
  Administered 2018-06-29: 650 mg via ORAL

## 2018-06-29 MED ORDER — ACETAMINOPHEN 325 MG PO TABS
ORAL_TABLET | ORAL | Status: AC
  Filled 2018-06-29: qty 2

## 2018-06-29 MED ORDER — DIPHENHYDRAMINE HCL 25 MG PO CAPS
ORAL_CAPSULE | ORAL | Status: AC
  Filled 2018-06-29: qty 2

## 2018-06-29 NOTE — Patient Instructions (Signed)
[facility] Discharge Instructions for Patients Receiving Chemotherapy  Today you received the following chemotherapy agents:  Darzalex  To help prevent nausea and vomiting after your treatment, we encourage you to take your nausea medication as prescribed.   If you develop nausea and vomiting that is not controlled by your nausea medication, call the clinic.   BELOW ARE SYMPTOMS THAT SHOULD BE REPORTED IMMEDIATELY:  *FEVER GREATER THAN 100.5 F  *CHILLS WITH OR WITHOUT FEVER  NAUSEA AND VOMITING THAT IS NOT CONTROLLED WITH YOUR NAUSEA MEDICATION  *UNUSUAL SHORTNESS OF BREATH  *UNUSUAL BRUISING OR BLEEDING  TENDERNESS IN MOUTH AND THROAT WITH OR WITHOUT PRESENCE OF ULCERS  *URINARY PROBLEMS  *BOWEL PROBLEMS  UNUSUAL RASH Items with * indicate a potential emergency and should be followed up as soon as possible.  Feel free to call the clinic should you have any questions or concerns. The clinic phone number is [phone].  Please show the CHEMO ALERT CARD at check-in to the Emergency Department and triage nurse.   

## 2018-06-30 LAB — KAPPA/LAMBDA LIGHT CHAINS
Kappa free light chain: 14.4 mg/L (ref 3.3–19.4)
Kappa, lambda light chain ratio: 1.3 (ref 0.26–1.65)
Lambda free light chains: 11.1 mg/L (ref 5.7–26.3)

## 2018-07-03 NOTE — Progress Notes (Signed)
HEMATOLOGY/ONCOLOGY CLINIC NOTE  Date of Service: 07/06/2018  Patient Care Team: [name], MD as PCP - General (Family Medicine) [name], MD (Cardiology) [name], MD (Cardiology)  CHIEF COMPLAINTS/PURPOSE OF CONSULTATION:  Recently diagnosed myeloma  HISTORY OF PRESENTING ILLNESS:   Jonathon [name] is a wonderful 83 y.o. male who has been referred to [place] by Dr. [name] for evaluation and management of Lytic bone lesions. He is accompanied today by his daughter. The pt reports that he is doing well overall. He is currently a resident at [facility] in [place], [place].   The pt presented to the ED on 03/24/18 with a right distal clavicle fracture, which occurred after trying to get out of bed and pushing himself up with his right arm. He notes that his energy has been noticeably decreased for the last month. He also notes that his lower back has a slight sting when trying to stand up from sitting. He also notes some pain in his lower, right chest wall, which is exacerbated when coughing. The pt has been taking '50mg'$  Tramadol which is controlling his pain. He does endorse some weakening of both of his lower extremities in the past 1-2 months, which he attributes somewhat to his recent lower back pain.   The pt denies any bowel abnormalities recently. He notes that he stays very well hydrated, and urinates 4-5 times a night, and does have BPH, and takes Flomax. The pt denies any recent changes in his urination habits.   The pt notes that his appetite decreased for a few months, until he began assisted living. He has lost 9 pounds in the last week.   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 He now uses a walker to ambulate. He notes that the CIDP symptoms are primarily located to his legs. He sees Dr. Narda Amber in Neurology. He took  steroids without any symptomatic relief. IVIG was cost-prohibitive for the patient, but was recommended.    Regarding the patient's anemia in April 2019, he denies any obvious bleeding at that time, and denies surgeries.    The pt notes that his last colonoscopy, in 2015, was not concerning, which is corroborated by chart review.   The pt and daughter deny any concerns for memory issues, and the daughter adds his memory is "elephant-like". They are not sure why Namenda is on his medication list.   The pt denies any recent dental procedures, or concerns for dental issues. He has maintained every 6 month dental cleanings for all of his life.   Of note prior to the patient's visit today, pt has had a CT C/A/P completed on 03/31/18 with results revealing IMPRESSION: 1. Lytic lesion throughout all visualized vertebra from the lower cervical spine through the sacrum. Epidural extension of tumor T2, T3 and L2 level. Right L3-4 foraminal extension tumor. 2. Lytic lesions involving majority of ribs. Multiple pathologic rib fractures with bony expansion/sub pleural extension of tumor at several levels. 3. Lytic lesions of the scapula, hips bilaterally and pelvis bilaterally. With further progression of tumor, patient may be at risk for pathologic fractures. Pathologic fracture right clavicle incompletely assessed. 4. Circumferential narrowing sigmoid colon. Question possibility of underlying mass (series 2, image 97). Alternatively this could represent muscular hypertrophy/peristalsis. 5. Gastric antrum circumferential narrowing. Cannot exclude mass although this may  be related to peristalsis. 6. No primary lung malignancy noted. 7. Matted aortic pulmonary window lymph nodes. 8. Circumferential urinary bladder wall thickening greater anterior dome region. Right lateral bladder diverticulum. 9. 5 mm low-density lesion pancreatic body (series 5, image 10) unchanged. This is felt unlikely to be a primary  malignancy. 10. Right parapelvic 2.2 cm cyst without significant change. Scattered low-density renal lesions bila2terally too small to adequately characterize. Some were present previously. Statistically these are likely cysts. 11. Stable adrenal gland hyperplasia. 12. Gynecomastia. 13. Prostate gland causes slight impression upon the bladder base. 14.  Aortic Atherosclerosis. 15. Sequential pacemaker in place. 16. Gallstones.  Most recent lab results (03/25/18) of CBC w/diff and CMP is as follows: all values are WNL except for RBC at 4.14, HGB at 11.3, HCT at 33.6, Chloride at 94, CO2 at 33, Glucose at 100, BUN at 25, Total Protein at 9.4, Albumin at 3.4, Calcium at 13.8, GFR at 58. 03/25/18 PSA, Total was normal at 1.2  On review of systems, pt reports positional lower back pain, lower right chest wall pain, recently decreased energy levels, relatively weaker appetite, weight loss, right clavicle pain, weakening of the lower extremities, moving his bowels well, and denies changes in urination, abdominal pains, changes in breathing, changes in bowel habits, pain along the spine, leg swelling, and any other symptoms.  Interval History:   Jonathon [name] returns today for management, evaluation, and C3D15 treatment of his Multiple Myeloma. The patient's last visit with [place] was on 06/08/18. He is accompanied today by his daughter. The pt reports that he is doing well overall.   The pt reports that he has been enjoying good energy levels, has been getting around well, and has been eating well. The pt notes that he is not having any more rib pain when he sneezes or coughs. He denies hip or back pain at this time, and is pursuing recumbent bicycling for 30 minutes each day. He notes that he is currently enjoying his life very much and endorses a good quality of life, adding that he is able to do all he wants to do at this time. He notes that his CIDP has been "very stable."  Of note since the patient's last visit,  pt has had DG Hips bilat completed on 06/08/18 with results revealing Innumerable lucencies are noted throughout the pelvis and proximal femurs, similar findings noted on prior PET-CT of 04/23/2018. Findings are consistent patient's known multiple myeloma. Femoral necks are intact. 2.  Aortoiliac atherosclerotic vascular disease.  Lab results today (07/06/18) of CBC w/diff and CMP is as follows: all values are WNL except for HGB at 12.4, RDW at 16.8, PLT at 141k, Glucose at 108, Calcium at 8.4, Alk Phos at 146.  On review of systems, pt reports good energy levels, eating well, improved bone pains, strong appetite, and denies new bone pains, mouth sores, tingling or numbness in his hands or feet, pain along the spine, leg swelling, and any other symptoms.  MEDICAL HISTORY:  Past Medical History:  Diagnosis Date  . Asthma   . BENIGN PROSTATIC HYPERTROPHY, HX OF 10/25/2008  . Bone metastases ([place]) 04/14/2018  . BRADYCARDIA 2005  . Chronic diastolic congestive heart failure ([place]) 06/05/2016  . CIDP (chronic inflammatory demyelinating polyneuropathy) ([place])   . HYPERTENSION 11/21/2006  . Iron deficiency anemia, unspecified 04/19/2013  . Multiple myeloma ([place]) 04/29/2018  . 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 PACEMAKER, PERMANENT 2005   Gen change 2014 Medtronic Adaptic  L dual-chamber pacemaker, serial #[id number]   . SVT (supraventricular tachycardia) ([place])   . TOBACCO ABUSE 10/25/2008   Quit 2012    SURGICAL HISTORY: Past Surgical History:  Procedure Laterality Date  . COLONOSCOPY  [id number]  . PACEMAKER INSERTION  2005  . PERMANENT PACEMAKER GENERATOR CHANGE N/A 06/19/2012   Procedure: PERMANENT PACEMAKER GENERATOR CHANGE;  Surgeon: [name], MD; Medtronic Adaptic L dual-chamber pacemaker, serial #[id number]    . SKIN GRAFT Right 1962   wrist    SOCIAL HISTORY: Social History   Socioeconomic History  . Marital status: Divorced    Spouse name: Not on file  . Number of  children: 4  . Years of education: Not on file  . Highest education level: Not on file  Occupational History  . Occupation: ENVIROMENTAL Location manager: [employer]  . Occupation: retired  Scientific laboratory technician  . Financial resource strain: Not on file  . Food insecurity:    Worry: Not on file    Inability: Not on file  . Transportation needs:    Medical: Not on file    Non-medical: Not on file  Tobacco Use  . Smoking status: Former Smoker    Packs/day: 0.25    Years: 46.00    Pack years: 11.50    Types: Cigarettes    Start date: 03/16/1965    Last attempt to quit: 06/15/2010    Years since quitting: 8.0  . Smokeless tobacco: Never Used  . Tobacco comment: quit 3 years ago  Substance and Sexual Activity  . Alcohol use: No    Alcohol/week: 0.0 standard drinks    Comment: 4 beers per day  . Drug use: No  . Sexual activity: Not on file  Lifestyle  . Physical activity:    Days per week: Not on file    Minutes per session: Not on file  . Stress: Not on file  Relationships  . Social connections:    Talks on phone: Not on file    Gets together: Not on file    Attends religious service: Not on file    Active member of club or organization: Not on file    Attends meetings of clubs or organizations: Not on file    Relationship status: Not on file  . Intimate partner violence:    Fear of current or ex partner: Not on file    Emotionally abused: Not on file    Physically abused: Not on file    Forced sexual activity: Not on file  Other Topics Concern  . Not on file  Social History Narrative   Has relocated from [place] in 2007. 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    FAMILY HISTORY: Family History  Problem Relation Age of Onset  . Heart attack Father        Died, 70  . Kidney disease Father   . Parkinson's disease Mother        Died, 59  . Breast cancer Sister        Living, 67  . Diabetes type II Brother        Living, 22  . Breast cancer  Sister        Living, 54  . Diabetes Daughter        Living, 16  . Diabetes Son        Living, 50  . Ovarian cancer Daughter   . Colon cancer Neg Hx   . Rectal  cancer Neg Hx   . Stomach cancer Neg Hx     ALLERGIES:  is allergic to lexapro [escitalopram oxalate].  MEDICATIONS:  Current Outpatient Medications  Medication Sig Dispense Refill  . acyclovir (ZOVIRAX) 400 MG tablet Take 1 tablet (400 mg total) by mouth 2 (two) times daily. 60 tablet 11  . aspirin EC 81 MG tablet Take 81 mg by mouth daily.    Marland Kitchen dexamethasone (DECADRON) 4 MG tablet '20mg'$  (5 tabs) with breakfast the day after each Daratumumab infusion 20 tablet 4  . flecainide (TAMBOCOR) 50 MG tablet Take 75 mg by mouth daily as needed.    . furosemide (LASIX) 40 MG tablet Take 40 mg by mouth daily.    . irbesartan (AVAPRO) 300 MG tablet Take 300 mg by mouth daily.    . Melatonin 3 MG CAPS Take 3 mg by mouth at bedtime.    . memantine (NAMENDA) 5 MG tablet Take 5 mg by mouth 2 (two) times daily.    . metoprolol tartrate (LOPRESSOR) 25 MG tablet Take 1 tablet (25 mg total) by mouth 2 (two) times daily.    . ondansetron (ZOFRAN) 8 MG tablet Take 1 tablet (8 mg total) by mouth 2 (two) times daily as needed (Nausea or vomiting). 30 tablet 1  . polyethylene glycol (MIRALAX / GLYCOLAX) packet Take 17 g by mouth daily. 14 each 0  . prochlorperazine (COMPAZINE) 10 MG tablet Take 1 tablet (10 mg total) by mouth every 6 (six) hours as needed (Nausea or vomiting). 30 tablet 1  . Saccharomyces boulardii (FLORASTOR PO) Take 250 mg by mouth 2 (two) times daily.    . tamsulosin (FLOMAX) 0.4 MG CAPS capsule Take 2 capsules (0.8 mg total) by mouth daily. 90 capsule 0  . traMADol (ULTRAM) 50 MG tablet Take 1 tablet( 50 mg total) by mouth every 8 hrs as needed for pain 90 tablet 2  . vitamin B-12 (CYANOCOBALAMIN) 500 MCG tablet Take 1,000 mcg by mouth daily.    . Vitamin D, Ergocalciferol, (DRISDOL) 50000 units CAPS capsule Take 50,000 Units by  mouth every 7 (seven) days.     No current facility-administered medications for this visit.     REVIEW OF SYSTEMS:    A 10+ POINT REVIEW OF SYSTEMS WAS OBTAINED including neurology, dermatology, psychiatry, cardiac, respiratory, lymph, extremities, GI, GU, Musculoskeletal, constitutional, breasts, reproductive, HEENT.  All pertinent positives are noted in the HPI.  All others are negative.   PHYSICAL EXAMINATION: ECOG PERFORMANCE STATUS: 2 - Symptomatic, <50% confined to bed  . Vitals:   07/06/18 1406  BP: (!) 177/92  Pulse: 78  Resp: 18  Temp: 98.6 F (37 C)  SpO2: 100%   Filed Weights   07/06/18 1406  Weight: 200 lb 8 oz (90.9 kg)   .Body mass index is 28.77 kg/m.  GENERAL:alert, in no acute distress and comfortable SKIN: no acute rashes, no significant lesions EYES: conjunctiva are pink and non-injected, sclera anicteric OROPHARYNX: MMM, no exudates, no oropharyngeal erythema or ulceration NECK: supple, no JVD LYMPH:  no palpable lymphadenopathy in the cervical, axillary or inguinal regions LUNGS: clear to auscultation b/l with normal respiratory effort HEART: regular rate & rhythm ABDOMEN:  normoactive bowel sounds , non tender, not distended. No palpable hepatosplenomegaly.  Extremity: no pedal edema PSYCH: alert & oriented x 3 with fluent speech NEURO: no focal motor/sensory deficits   LABORATORY DATA:  I have reviewed the data as listed  . CBC Latest Ref Rng & Units 07/06/2018 06/29/2018  06/22/2018  WBC 4.0 - 10.5 K/uL 6.2 5.9 5.1  Hemoglobin 13.0 - 17.0 g/dL 12.4(L) 12.8(L) 12.8(L)  Hematocrit 39.0 - 52.0 % 39.8 40.4 39.7  Platelets 150 - 400 K/uL 141(L) 126(L) 146(L)    . CMP Latest Ref Rng & Units 07/06/2018 06/29/2018 06/22/2018  Glucose 70 - 99 mg/dL 108(H) 99 98  BUN 8 - 23 mg/dL '18 16 13  '$ Creatinine 0.61 - 1.24 mg/dL 1.09 1.02 0.99  Sodium 135 - 145 mmol/L 139 141 141  Potassium 3.5 - 5.1 mmol/L 4.2 4.4 4.5  Chloride 98 - 111 mmol/L 104 108 106    CO2 22 - 32 mmol/L '24 23 24  '$ Calcium 8.9 - 10.3 mg/dL 8.4(L) 8.8(L) 8.8(L)  Total Protein 6.5 - 8.1 g/dL 6.8 6.9 6.9  Total Bilirubin 0.3 - 1.2 mg/dL 0.5 0.6 0.9  Alkaline Phos 38 - 126 U/L 146(H) 169(H) 205(H)  AST 15 - 41 U/L '20 17 19  '$ ALT 0 - 44 U/L '20 16 19   '$ 04/17/18 BM Bx:    RADIOGRAPHIC STUDIES: I have personally reviewed the radiological images as listed and agreed with the findings in the report. Dg Hips Bilat With Pelvis 3-4 Views  Result Date: 06/09/2018 CLINICAL DATA:  Multiple myeloma. EXAM: DG HIP (WITH OR WITHOUT PELVIS) 3-4V BILAT COMPARISON:  PET-CT 04/23/2018. FINDINGS: Innumerable lucencies are noted throughout the pelvis and proximal femurs, similar findings noted on prior PET-CT of 04/23/2018. Findings are consistent patient's known multiple myeloma. Femoral necks are intact. No acute bony abnormality. Aortoiliac atherosclerotic vascular calcification. IMPRESSION: 1. Innumerable lucencies are noted throughout the pelvis and proximal femurs, similar findings noted on prior PET-CT of 04/23/2018. Findings are consistent patient's known multiple myeloma. Femoral necks are intact. 2.  Aortoiliac atherosclerotic vascular disease. Electronically Signed   By: Marcello Moores  Register   On: 06/09/2018 08:08    ASSESSMENT & PLAN:  83 y.o. male with  1. Lytic bone lesions - w/u consistent with newly diagnosed multiple myeloma Labs upon initial presentation from 04/14/18; hypercalcemic with Calcium at 13.8, renal function up form baseline with Creatinine at 1.48, HGB at 11.3. PSA was normal.   03/31/18 CT C/A/P revealed 1. Lytic lesion throughout all visualized vertebra from the lower cervical spine through the sacrum. Epidural extension of tumor T2, T3 and L2 level. Right L3-4 foraminal extension tumor. 2. Lytic lesions involving majority of ribs. Multiple pathologic rib fractures with bony expansion/sub pleural extension of tumor at several levels. 3. Lytic lesions of the scapula, hips  bilaterally and pelvis bilaterally. With further progression of tumor, patient may be at risk for pathologic fractures. Pathologic fracture right clavicle incompletely assessed. 4. Circumferential narrowing sigmoid colon. Question possibility of underlying mass (series 2, image 97). Alternatively this could represent muscular hypertrophy/peristalsis. 5. Gastric antrum circumferential narrowing. Cannot exclude mass although this may be related to peristalsis. 6. No primary lung malignancy noted. 7. Matted aortic pulmonary window lymph nodes. 8. Circumferential urinary bladder wall thickening greater anterior dome region. Right lateral bladder diverticulum. 9. 5 mm low-density lesion pancreatic body (series 5, image 10) unchanged. This is felt unlikely to be a primary malignancy. 10. Right parapelvic 2.2 cm cyst without significant change. Scattered low-density renal lesions bilaterally too small to adequately characterize. Some were present previously. Statistically these are likely cysts. 11. Stable adrenal gland hyperplasia. 12. Gynecomastia. 13. Prostate gland causes slight impression upon the bladder base. 14.  Aortic Atherosclerosis. 15. Sequential pacemaker in place. 16. Gallstones.   04/23/18 PET/CT revealed Innumerable hypermetabolic lytic  lesions throughout the skeleton especially involving the spine, ribs, bony pelvis along with some involvement of both proximal femurs. The appearance is compatible with pathologic diagnosis of plasma cell neoplasm could well reflect multiple myeloma. Resulting pathologic fractures of the right lateral clavicle and of multiple bilateral ribs. Lesions are present along the cortical margins of the spinal canal. 2. Both the area and the stomach in the area in the sigmoid colon drawn attention to on prior CT scan appear benign normal today. 3. The confluence of AP window lymph nodes measures about 1.2 cm in short axis with maximum SUV 3.1, which is mildly above the blood pool  merit surveillance. 4. Other imaging findings of potential clinical significance: Aortic Atherosclerosis. Coronary atherosclerosis. Pacemaker noted. Borderline prostatomegaly. Cholelithiasis.  04/21/18 BM Bx revealed Normocellular bone marrow with Plasma Cell neoplasm. Scattered medium sized clusters of kappa-restricted plasma cells (14% aspirate, 10% CD138 immunohistochemistry.   I do suspect that the burden of disease is underestimated in the bone marrow sample given PET/CT findings of innumerable lytic lesions and an M spike of 3.4g   2. Pathologic right clavicle fracture 3. Hypercalcemia 03/25/18 CMP revealed Calcium at 13.8-- now down to 11.1 Will begin Xgeva injections, and strongly recommended keeping very well hydrated   PLAN:  -Discussed pt labwork today, 07/06/18; blood counts are stable, calcium has normalized -Discussed the 06/08/18 DG Hips bilat which revealed Innumerable lucencies are noted throughout the pelvis and proximal femurs, similar findings noted on prior PET-CT of 04/23/2018. Findings are consistent patient's known multiple myeloma. Femoral necks are intact. 2.  Aortoiliac atherosclerotic vascular disease. -M Protein improved from 3.4g two months ago, to 0.2g on 06/22/18 labs, indicating that the pt has attained a very good partial response -The pt has no prohibitive toxicities from continuing C3 Daratumumab and Dexamethasone at this time. -No strong indication to add on additional medication like Revlimid at this time -Discussed the option to pursue transplant consideration vs maintenance therapy after completing induction. The pt notes that he is very comfortable with not pursuing transplant, and is appreciating the current preservation of his quality of life without intrusive therapies. -Will recheck Vitamin D levels and will adjust replacement as indicated -CIDP has been considered in the treatment choice -Continue Xgeva every 4 weeks -Again advised that the pt return to  care with his orthopedist for consideration of risk stratification of his hip and femur involvement, and determining need for prophylactic fixation -Again advised that the pt limit his physical stressors, and limit bending, pushing, pulling and stairs to limit risk of further pathologic bone fractures  -Will see the pt back in 4 weeks   Please schedule C4 and C5 of Daratumumab as ordered with labs q2weeks with each treatment. Continue Marchelle Folks RTC with Dr Irene Limbo in 4 weeks   All of the patients questions were answered with apparent satisfaction. The patient knows to call the clinic with any problems, questions or concerns.  The total time spent in the appt was 25 minutes and more than 50% was on counseling and direct patient cares.    Sullivan Lone MD MS AAHIVMS [facility] Hematology/Oncology Physician [facility]  (Office):       [phone] (Work cell):  [phone] (Fax):           [phone]  07/06/2018 2:40 PM  I, Baldwin Jamaica, am acting as a scribe for Dr. Sullivan Lone.   .I have reviewed the above documentation for accuracy and completeness, and I agree with the  above. .Brunetta Genera MD

## 2018-07-06 ENCOUNTER — Inpatient Hospital Stay (HOSPITAL_BASED_OUTPATIENT_CLINIC_OR_DEPARTMENT_OTHER): Payer: Medicare Other | Admitting: Hematology

## 2018-07-06 ENCOUNTER — Telehealth: Payer: Self-pay | Admitting: Hematology

## 2018-07-06 ENCOUNTER — Inpatient Hospital Stay: Payer: Medicare Other

## 2018-07-06 ENCOUNTER — Other Ambulatory Visit: Payer: Self-pay

## 2018-07-06 VITALS — BP 177/92 | HR 78 | Temp 98.6°F | Resp 18 | Ht 70.0 in | Wt 200.5 lb

## 2018-07-06 DIAGNOSIS — R531 Weakness: Secondary | ICD-10-CM | POA: Diagnosis not present

## 2018-07-06 DIAGNOSIS — Z5112 Encounter for antineoplastic immunotherapy: Secondary | ICD-10-CM | POA: Diagnosis not present

## 2018-07-06 DIAGNOSIS — I110 Hypertensive heart disease with heart failure: Secondary | ICD-10-CM | POA: Diagnosis not present

## 2018-07-06 DIAGNOSIS — R5383 Other fatigue: Secondary | ICD-10-CM | POA: Diagnosis not present

## 2018-07-06 DIAGNOSIS — R0789 Other chest pain: Secondary | ICD-10-CM | POA: Diagnosis not present

## 2018-07-06 DIAGNOSIS — Z950 Presence of cardiac pacemaker: Secondary | ICD-10-CM | POA: Diagnosis not present

## 2018-07-06 DIAGNOSIS — M545 Low back pain: Secondary | ICD-10-CM

## 2018-07-06 DIAGNOSIS — E559 Vitamin D deficiency, unspecified: Secondary | ICD-10-CM

## 2018-07-06 DIAGNOSIS — Z7982 Long term (current) use of aspirin: Secondary | ICD-10-CM | POA: Diagnosis not present

## 2018-07-06 DIAGNOSIS — C9000 Multiple myeloma not having achieved remission: Secondary | ICD-10-CM

## 2018-07-06 DIAGNOSIS — Z8041 Family history of malignant neoplasm of ovary: Secondary | ICD-10-CM

## 2018-07-06 DIAGNOSIS — Z79899 Other long term (current) drug therapy: Secondary | ICD-10-CM | POA: Diagnosis not present

## 2018-07-06 DIAGNOSIS — C7951 Secondary malignant neoplasm of bone: Secondary | ICD-10-CM

## 2018-07-06 DIAGNOSIS — Z803 Family history of malignant neoplasm of breast: Secondary | ICD-10-CM

## 2018-07-06 DIAGNOSIS — Z87891 Personal history of nicotine dependence: Secondary | ICD-10-CM

## 2018-07-06 DIAGNOSIS — I5032 Chronic diastolic (congestive) heart failure: Secondary | ICD-10-CM

## 2018-07-06 LAB — CBC WITH DIFFERENTIAL/PLATELET
Abs Immature Granulocytes: 0.01 10*3/uL (ref 0.00–0.07)
Basophils Absolute: 0 10*3/uL (ref 0.0–0.1)
Basophils Relative: 0 %
Eosinophils Absolute: 0.3 10*3/uL (ref 0.0–0.5)
Eosinophils Relative: 5 %
HCT: 39.8 % (ref 39.0–52.0)
Hemoglobin: 12.4 g/dL — ABNORMAL LOW (ref 13.0–17.0)
Immature Granulocytes: 0 %
LYMPHS PCT: 38 %
Lymphs Abs: 2.3 10*3/uL (ref 0.7–4.0)
MCH: 27.1 pg (ref 26.0–34.0)
MCHC: 31.2 g/dL (ref 30.0–36.0)
MCV: 87.1 fL (ref 80.0–100.0)
Monocytes Absolute: 0.6 10*3/uL (ref 0.1–1.0)
Monocytes Relative: 9 %
Neutro Abs: 2.9 10*3/uL (ref 1.7–7.7)
Neutrophils Relative %: 48 %
Platelets: 141 10*3/uL — ABNORMAL LOW (ref 150–400)
RBC: 4.57 MIL/uL (ref 4.22–5.81)
RDW: 16.8 % — ABNORMAL HIGH (ref 11.5–15.5)
WBC: 6.2 10*3/uL (ref 4.0–10.5)
nRBC: 0 % (ref 0.0–0.2)

## 2018-07-06 LAB — CMP (CANCER CENTER ONLY)
ALT: 20 U/L (ref 0–44)
AST: 20 U/L (ref 15–41)
Albumin: 3.6 g/dL (ref 3.5–5.0)
Alkaline Phosphatase: 146 U/L — ABNORMAL HIGH (ref 38–126)
Anion gap: 11 (ref 5–15)
BUN: 18 mg/dL (ref 8–23)
CO2: 24 mmol/L (ref 22–32)
Calcium: 8.4 mg/dL — ABNORMAL LOW (ref 8.9–10.3)
Chloride: 104 mmol/L (ref 98–111)
Creatinine: 1.09 mg/dL (ref 0.61–1.24)
GFR, Est AFR Am: >60 mL/min (ref >60)
GFR, Estimated: >60 mL/min (ref >60)
Glucose, Bld: 108 mg/dL — ABNORMAL HIGH (ref 70–99)
Potassium: 4.2 mmol/L (ref 3.5–5.1)
Sodium: 139 mmol/L (ref 135–145)
Total Bilirubin: 0.5 mg/dL (ref 0.3–1.2)
Total Protein: 6.8 g/dL (ref 6.5–8.1)

## 2018-07-06 NOTE — Telephone Encounter (Signed)
Scheduled appt per 02/24 los. ° °Printed calendar and avs. °

## 2018-07-07 LAB — VITAMIN D 25 HYDROXY (VIT D DEFICIENCY, FRACTURES): Vit D, 25-Hydroxy: 70.4 ng/mL (ref 30.0–100.0)

## 2018-07-13 ENCOUNTER — Other Ambulatory Visit: Payer: Self-pay

## 2018-07-13 ENCOUNTER — Inpatient Hospital Stay: Payer: Medicare Other | Attending: Hematology

## 2018-07-13 ENCOUNTER — Inpatient Hospital Stay: Payer: Medicare Other

## 2018-07-13 ENCOUNTER — Ambulatory Visit: Payer: Self-pay | Admitting: Hematology

## 2018-07-13 VITALS — BP 137/78 | HR 73 | Temp 98.0°F | Resp 16

## 2018-07-13 DIAGNOSIS — Z5112 Encounter for antineoplastic immunotherapy: Secondary | ICD-10-CM | POA: Diagnosis not present

## 2018-07-13 DIAGNOSIS — C9000 Multiple myeloma not having achieved remission: Secondary | ICD-10-CM

## 2018-07-13 DIAGNOSIS — Z79899 Other long term (current) drug therapy: Secondary | ICD-10-CM | POA: Diagnosis not present

## 2018-07-13 DIAGNOSIS — I110 Hypertensive heart disease with heart failure: Secondary | ICD-10-CM | POA: Diagnosis not present

## 2018-07-13 DIAGNOSIS — Z87891 Personal history of nicotine dependence: Secondary | ICD-10-CM | POA: Insufficient documentation

## 2018-07-13 DIAGNOSIS — R0789 Other chest pain: Secondary | ICD-10-CM | POA: Diagnosis not present

## 2018-07-13 DIAGNOSIS — I5032 Chronic diastolic (congestive) heart failure: Secondary | ICD-10-CM | POA: Diagnosis not present

## 2018-07-13 DIAGNOSIS — C7951 Secondary malignant neoplasm of bone: Secondary | ICD-10-CM

## 2018-07-13 DIAGNOSIS — Z950 Presence of cardiac pacemaker: Secondary | ICD-10-CM | POA: Diagnosis not present

## 2018-07-13 DIAGNOSIS — Z7189 Other specified counseling: Secondary | ICD-10-CM

## 2018-07-13 DIAGNOSIS — Z87311 Personal history of (healed) other pathological fracture: Secondary | ICD-10-CM | POA: Diagnosis not present

## 2018-07-13 DIAGNOSIS — M545 Low back pain: Secondary | ICD-10-CM | POA: Diagnosis not present

## 2018-07-13 DIAGNOSIS — Z7982 Long term (current) use of aspirin: Secondary | ICD-10-CM | POA: Diagnosis not present

## 2018-07-13 LAB — CMP (CANCER CENTER ONLY)
ALBUMIN: 3.2 g/dL — AB (ref 3.5–5.0)
ALT: 14 U/L (ref 0–44)
AST: 16 U/L (ref 15–41)
Alkaline Phosphatase: 121 U/L (ref 38–126)
Anion gap: 8 (ref 5–15)
BUN: 16 mg/dL (ref 8–23)
CO2: 25 mmol/L (ref 22–32)
CREATININE: 0.98 mg/dL (ref 0.61–1.24)
Calcium: 8 mg/dL — ABNORMAL LOW (ref 8.9–10.3)
Chloride: 108 mmol/L (ref 98–111)
GFR, Est AFR Am: >60 mL/min (ref >60)
GFR, Estimated: >60 mL/min (ref >60)
Glucose, Bld: 112 mg/dL — ABNORMAL HIGH (ref 70–99)
Potassium: 4.1 mmol/L (ref 3.5–5.1)
Sodium: 141 mmol/L (ref 135–145)
Total Bilirubin: 0.5 mg/dL (ref 0.3–1.2)
Total Protein: 6 g/dL — ABNORMAL LOW (ref 6.5–8.1)

## 2018-07-13 LAB — CBC WITH DIFFERENTIAL/PLATELET
Abs Immature Granulocytes: 0.01 10*3/uL (ref 0.00–0.07)
Basophils Absolute: 0 10*3/uL (ref 0.0–0.1)
Basophils Relative: 1 %
Eosinophils Absolute: 0.3 10*3/uL (ref 0.0–0.5)
Eosinophils Relative: 6 %
HCT: 37.6 % — ABNORMAL LOW (ref 39.0–52.0)
Hemoglobin: 12 g/dL — ABNORMAL LOW (ref 13.0–17.0)
IMMATURE GRANULOCYTES: 0 %
Lymphocytes Relative: 38 %
Lymphs Abs: 2 10*3/uL (ref 0.7–4.0)
MCH: 27.5 pg (ref 26.0–34.0)
MCHC: 31.9 g/dL (ref 30.0–36.0)
MCV: 86 fL (ref 80.0–100.0)
Monocytes Absolute: 0.6 10*3/uL (ref 0.1–1.0)
Monocytes Relative: 11 %
NEUTROS PCT: 44 %
Neutro Abs: 2.4 10*3/uL (ref 1.7–7.7)
Platelets: 115 10*3/uL — ABNORMAL LOW (ref 150–400)
RBC: 4.37 MIL/uL (ref 4.22–5.81)
RDW: 16.5 % — ABNORMAL HIGH (ref 11.5–15.5)
WBC: 5.4 10*3/uL (ref 4.0–10.5)
nRBC: 0 % (ref 0.0–0.2)

## 2018-07-13 MED ORDER — ACETAMINOPHEN 325 MG PO TABS
650.0000 mg | ORAL_TABLET | Freq: Once | ORAL | Status: AC
  Administered 2018-07-13: 650 mg via ORAL

## 2018-07-13 MED ORDER — ACETAMINOPHEN 325 MG PO TABS
ORAL_TABLET | ORAL | Status: AC
  Filled 2018-07-13: qty 2

## 2018-07-13 MED ORDER — DIPHENHYDRAMINE HCL 25 MG PO CAPS
ORAL_CAPSULE | ORAL | Status: AC
  Filled 2018-07-13: qty 2

## 2018-07-13 MED ORDER — SODIUM CHLORIDE 0.9 % IV SOLN
20.0000 mg | Freq: Once | INTRAVENOUS | Status: AC
  Administered 2018-07-13: 20 mg via INTRAVENOUS
  Filled 2018-07-13: qty 2

## 2018-07-13 MED ORDER — FAMOTIDINE 20 MG IN NS 100 ML IVPB
20.0000 mg | Freq: Once | INTRAVENOUS | Status: DC
  Filled 2018-07-13: qty 100

## 2018-07-13 MED ORDER — MONTELUKAST SODIUM 10 MG PO TABS
ORAL_TABLET | ORAL | Status: AC
  Filled 2018-07-13: qty 1

## 2018-07-13 MED ORDER — FAMOTIDINE 20 MG IN NS 100 ML IVPB
20.0000 mg | Freq: Two times a day (BID) | INTRAVENOUS | Status: DC
  Filled 2018-07-13: qty 100

## 2018-07-13 MED ORDER — FAMOTIDINE IN NACL 20-0.9 MG/50ML-% IV SOLN: 20.0000 mg | Freq: Once | INTRAVENOUS | Status: DC

## 2018-07-13 MED ORDER — DENOSUMAB 120 MG/1.7ML ~~LOC~~ SOLN
SUBCUTANEOUS | Status: AC
  Filled 2018-07-13: qty 1.7

## 2018-07-13 MED ORDER — SODIUM CHLORIDE 0.9 % IV SOLN
Freq: Once | INTRAVENOUS | Status: AC
  Administered 2018-07-13: 09:00:00 via INTRAVENOUS
  Filled 2018-07-13: qty 250

## 2018-07-13 MED ORDER — DENOSUMAB 120 MG/1.7ML ~~LOC~~ SOLN
120.0000 mg | Freq: Once | SUBCUTANEOUS | Status: AC
  Administered 2018-07-13: 120 mg via SUBCUTANEOUS

## 2018-07-13 MED ORDER — MONTELUKAST SODIUM 10 MG PO TABS
10.0000 mg | ORAL_TABLET | Freq: Once | ORAL | Status: AC
  Administered 2018-07-13: 10 mg via ORAL

## 2018-07-13 MED ORDER — SODIUM CHLORIDE 0.9 % IV SOLN
20.0000 mg | Freq: Once | INTRAVENOUS | Status: AC
  Administered 2018-07-13: 20 mg via INTRAVENOUS
  Filled 2018-07-13: qty 20

## 2018-07-13 MED ORDER — DIPHENHYDRAMINE HCL 25 MG PO CAPS
50.0000 mg | ORAL_CAPSULE | Freq: Once | ORAL | Status: AC
  Administered 2018-07-13: 50 mg via ORAL

## 2018-07-13 MED ORDER — SODIUM CHLORIDE 0.9 % IV SOLN
16.2000 mg/kg | Freq: Once | INTRAVENOUS | Status: AC
  Administered 2018-07-13: 1400 mg via INTRAVENOUS
  Filled 2018-07-13: qty 60

## 2018-07-13 NOTE — Patient Instructions (Signed)
[facility] Discharge Instructions for Patients Receiving Chemotherapy  Today you received the following chemotherapy agents:  Darzalex  To help prevent nausea and vomiting after your treatment, we encourage you to take your nausea medication as prescribed.   If you develop nausea and vomiting that is not controlled by your nausea medication, call the clinic.   BELOW ARE SYMPTOMS THAT SHOULD BE REPORTED IMMEDIATELY:  *FEVER GREATER THAN 100.5 F  *CHILLS WITH OR WITHOUT FEVER  NAUSEA AND VOMITING THAT IS NOT CONTROLLED WITH YOUR NAUSEA MEDICATION  *UNUSUAL SHORTNESS OF BREATH  *UNUSUAL BRUISING OR BLEEDING  TENDERNESS IN MOUTH AND THROAT WITH OR WITHOUT PRESENCE OF ULCERS  *URINARY PROBLEMS  *BOWEL PROBLEMS  UNUSUAL RASH Items with * indicate a potential emergency and should be followed up as soon as possible.  Feel free to call the clinic should you have any questions or concerns. The clinic phone number is [phone].  Please show the CHEMO ALERT CARD at check-in to the Emergency Department and triage nurse.   

## 2018-07-13 NOTE — Progress Notes (Signed)
MD ok'd Delton See despite Ca level today. Kennith Center, Pharm.D., CPP 07/13/2018@10 :10 AM

## 2018-07-20 ENCOUNTER — Inpatient Hospital Stay: Payer: Medicare Other

## 2018-07-20 DIAGNOSIS — Z5112 Encounter for antineoplastic immunotherapy: Secondary | ICD-10-CM | POA: Diagnosis not present

## 2018-07-20 DIAGNOSIS — C9000 Multiple myeloma not having achieved remission: Secondary | ICD-10-CM | POA: Diagnosis not present

## 2018-07-20 DIAGNOSIS — M545 Low back pain: Secondary | ICD-10-CM | POA: Diagnosis not present

## 2018-07-20 DIAGNOSIS — C7951 Secondary malignant neoplasm of bone: Secondary | ICD-10-CM | POA: Diagnosis not present

## 2018-07-20 DIAGNOSIS — R0789 Other chest pain: Secondary | ICD-10-CM | POA: Diagnosis not present

## 2018-07-20 LAB — CBC WITH DIFFERENTIAL/PLATELET
Abs Immature Granulocytes: 0.02 10*3/uL (ref 0.00–0.07)
Basophils Absolute: 0 10*3/uL (ref 0.0–0.1)
Basophils Relative: 1 %
Eosinophils Absolute: 0.3 10*3/uL (ref 0.0–0.5)
Eosinophils Relative: 5 %
HEMATOCRIT: 38.2 % — AB (ref 39.0–52.0)
HEMOGLOBIN: 12.3 g/dL — AB (ref 13.0–17.0)
Immature Granulocytes: 0 %
LYMPHS ABS: 1.8 10*3/uL (ref 0.7–4.0)
LYMPHS PCT: 33 %
MCH: 27.4 pg (ref 26.0–34.0)
MCHC: 32.2 g/dL (ref 30.0–36.0)
MCV: 85.1 fL (ref 80.0–100.0)
Monocytes Absolute: 0.6 10*3/uL (ref 0.1–1.0)
Monocytes Relative: 11 %
Neutro Abs: 2.8 10*3/uL (ref 1.7–7.7)
Neutrophils Relative %: 50 %
Platelets: 135 10*3/uL — ABNORMAL LOW (ref 150–400)
RBC: 4.49 MIL/uL (ref 4.22–5.81)
RDW: 16.4 % — ABNORMAL HIGH (ref 11.5–15.5)
WBC: 5.6 10*3/uL (ref 4.0–10.5)
nRBC: 0 % (ref 0.0–0.2)

## 2018-07-20 LAB — COMPREHENSIVE METABOLIC PANEL
ALT: 19 U/L (ref 0–44)
AST: 20 U/L (ref 15–41)
Albumin: 3.9 g/dL (ref 3.5–5.0)
Alkaline Phosphatase: 103 U/L (ref 38–126)
Anion gap: 8 (ref 5–15)
BUN: 20 mg/dL (ref 8–23)
CO2: 26 mmol/L (ref 22–32)
Calcium: 8.6 mg/dL — ABNORMAL LOW (ref 8.9–10.3)
Chloride: 106 mmol/L (ref 98–111)
Creatinine, Ser: 1.01 mg/dL (ref 0.61–1.24)
GFR calc Af Amer: >60 mL/min (ref >60)
GFR calc non Af Amer: >60 mL/min (ref >60)
Glucose, Bld: 102 mg/dL — ABNORMAL HIGH (ref 70–99)
POTASSIUM: 4.1 mmol/L (ref 3.5–5.1)
Sodium: 140 mmol/L (ref 135–145)
Total Bilirubin: 0.4 mg/dL (ref 0.3–1.2)
Total Protein: 6.4 g/dL — ABNORMAL LOW (ref 6.5–8.1)

## 2018-07-22 LAB — MULTIPLE MYELOMA PANEL, SERUM
ALBUMIN/GLOB SERPL: 1.3 (ref 0.7–1.7)
Albumin SerPl Elph-Mcnc: 3.3 g/dL (ref 2.9–4.4)
Alpha 1: 0.2 g/dL (ref 0.0–0.4)
Alpha2 Glob SerPl Elph-Mcnc: 0.8 g/dL (ref 0.4–1.0)
B-Globulin SerPl Elph-Mcnc: 1 g/dL (ref 0.7–1.3)
Gamma Glob SerPl Elph-Mcnc: 0.5 g/dL (ref 0.4–1.8)
Globulin, Total: 2.6 g/dL (ref 2.2–3.9)
IGM (IMMUNOGLOBULIN M), SRM: 42 mg/dL (ref 15–143)
IgA: 167 mg/dL (ref 61–437)
IgG (Immunoglobin G), Serum: 659 mg/dL — ABNORMAL LOW (ref 700–1600)
M Protein SerPl Elph-Mcnc: 0.2 g/dL — ABNORMAL HIGH
Total Protein ELP: 5.9 g/dL — ABNORMAL LOW (ref 6.0–8.5)

## 2018-07-23 ENCOUNTER — Other Ambulatory Visit: Payer: Self-pay

## 2018-07-23 ENCOUNTER — Emergency Department (HOSPITAL_BASED_OUTPATIENT_CLINIC_OR_DEPARTMENT_OTHER)
Admission: EM | Admit: 2018-07-23 | Discharge: 2018-07-23 | Disposition: A | Discharge status: Home / Self Care | Payer: Medicare Other | Attending: Emergency Medicine | Admitting: Emergency Medicine

## 2018-07-23 ENCOUNTER — Emergency Department (HOSPITAL_BASED_OUTPATIENT_CLINIC_OR_DEPARTMENT_OTHER): Payer: Medicare Other

## 2018-07-23 ENCOUNTER — Encounter (HOSPITAL_BASED_OUTPATIENT_CLINIC_OR_DEPARTMENT_OTHER): Payer: Self-pay

## 2018-07-23 DIAGNOSIS — Z79899 Other long term (current) drug therapy: Secondary | ICD-10-CM | POA: Diagnosis not present

## 2018-07-23 DIAGNOSIS — Z7982 Long term (current) use of aspirin: Secondary | ICD-10-CM | POA: Diagnosis not present

## 2018-07-23 DIAGNOSIS — Z950 Presence of cardiac pacemaker: Secondary | ICD-10-CM | POA: Diagnosis not present

## 2018-07-23 DIAGNOSIS — I2510 Atherosclerotic heart disease of native coronary artery without angina pectoris: Secondary | ICD-10-CM | POA: Diagnosis not present

## 2018-07-23 DIAGNOSIS — Z20828 Contact with and (suspected) exposure to other viral communicable diseases: Secondary | ICD-10-CM | POA: Diagnosis not present

## 2018-07-23 DIAGNOSIS — J111 Influenza due to unidentified influenza virus with other respiratory manifestations: Secondary | ICD-10-CM | POA: Insufficient documentation

## 2018-07-23 DIAGNOSIS — I110 Hypertensive heart disease with heart failure: Secondary | ICD-10-CM | POA: Diagnosis not present

## 2018-07-23 DIAGNOSIS — J45909 Unspecified asthma, uncomplicated: Secondary | ICD-10-CM | POA: Diagnosis not present

## 2018-07-23 DIAGNOSIS — R05 Cough: Secondary | ICD-10-CM | POA: Diagnosis not present

## 2018-07-23 DIAGNOSIS — Z87891 Personal history of nicotine dependence: Secondary | ICD-10-CM | POA: Diagnosis not present

## 2018-07-23 DIAGNOSIS — I5032 Chronic diastolic (congestive) heart failure: Secondary | ICD-10-CM | POA: Diagnosis not present

## 2018-07-23 DIAGNOSIS — R69 Illness, unspecified: Secondary | ICD-10-CM

## 2018-07-23 LAB — BASIC METABOLIC PANEL WITH GFR
Anion gap: 7 (ref 5–15)
BUN: 17 mg/dL (ref 8–23)
CO2: 26 mmol/L (ref 22–32)
Calcium: 8.1 mg/dL — ABNORMAL LOW (ref 8.9–10.3)
Chloride: 103 mmol/L (ref 98–111)
Creatinine, Ser: 1.04 mg/dL (ref 0.61–1.24)
GFR calc Af Amer: >60 mL/min
GFR calc non Af Amer: >60 mL/min
Glucose, Bld: 102 mg/dL — ABNORMAL HIGH (ref 70–99)
Potassium: 4 mmol/L (ref 3.5–5.1)
Sodium: 136 mmol/L (ref 135–145)

## 2018-07-23 LAB — PHOSPHORUS: Phosphorus: 2.9 mg/dL (ref 2.5–4.6)

## 2018-07-23 LAB — CBC WITH DIFFERENTIAL/PLATELET
Abs Immature Granulocytes: 0.02 K/uL (ref 0.00–0.07)
Basophils Absolute: 0 K/uL (ref 0.0–0.1)
Basophils Relative: 0 %
Eosinophils Absolute: 0.2 K/uL (ref 0.0–0.5)
Eosinophils Relative: 3 %
HCT: 38 % — ABNORMAL LOW (ref 39.0–52.0)
Hemoglobin: 11.7 g/dL — ABNORMAL LOW (ref 13.0–17.0)
Immature Granulocytes: 0 %
Lymphocytes Relative: 24 %
Lymphs Abs: 1.6 K/uL (ref 0.7–4.0)
MCH: 26.5 pg (ref 26.0–34.0)
MCHC: 30.8 g/dL (ref 30.0–36.0)
MCV: 86.2 fL (ref 80.0–100.0)
Monocytes Absolute: 1.1 K/uL — ABNORMAL HIGH (ref 0.1–1.0)
Monocytes Relative: 16 %
Neutro Abs: 3.8 K/uL (ref 1.7–7.7)
Neutrophils Relative %: 57 %
Platelets: 125 K/uL — ABNORMAL LOW (ref 150–400)
RBC: 4.41 MIL/uL (ref 4.22–5.81)
RDW: 16.1 % — ABNORMAL HIGH (ref 11.5–15.5)
WBC: 6.6 K/uL (ref 4.0–10.5)
nRBC: 0 % (ref 0.0–0.2)

## 2018-07-23 LAB — MAGNESIUM: MAGNESIUM: 1.7 mg/dL (ref 1.7–2.4)

## 2018-07-23 MED ORDER — OSELTAMIVIR PHOSPHATE 75 MG PO CAPS: 75.0000 mg | ORAL_CAPSULE | Freq: Two times a day (BID) | ORAL | 0 refills | Status: DC

## 2018-07-23 MED ORDER — OSELTAMIVIR PHOSPHATE 75 MG PO CAPS
75.0000 mg | ORAL_CAPSULE | Freq: Once | ORAL | Status: AC
  Administered 2018-07-23: 75 mg via ORAL
  Filled 2018-07-23: qty 1

## 2018-07-23 NOTE — ED Provider Notes (Signed)
[facility] EMERGENC[facility] DEPARTMENT Provider Note   CSN: [id number] Arrival date & time: 07/23/18  1821    History   Chief Complaint Chief Complaint  Patient presents with   Cough    HPI Jonathon [name] is a 83 y.o. male.     HPI Patient started developing a slight cough about 2 days ago.  He reports that today the nurse measured a fever up to 101.  He reports he has not felt short of breath or having any chest pain.  Patient has known multiple bone lytic lesions from multiple myeloma.  He denies any worsening of pain.  No swelling in the legs.  No nausea vomiting.  Patient's daughter reports that today his companion at the dining table has tested positive for influenza B. Past Medical History:  Diagnosis Date   Asthma    BENIGN PROSTATIC HYPERTROPHY, HX OF 10/25/2008   Bone metastases ([place]) 04/14/2018   BRADYCARDIA 2005   Chronic diastolic congestive heart failure ([place]) 06/05/2016   CIDP (chronic inflammatory demyelinating polyneuropathy) ([place])    HYPERTENSION 11/21/2006   Iron deficiency anemia, unspecified 04/19/2013   Multiple myeloma ([place]) 04/29/2018   NEPHROLITHIASIS, HX OF 11/21/2006 and 10/15/2008   PACEMAKER, PERMANENT 2005   Gen change 2014 Medtronic Adaptic L dual-chamber pacemaker, serial #[id number]    SVT (supraventricular tachycardia) ([place])    TOBACCO ABUSE 10/25/2008   Quit 2012    Patient Active Problem List   Diagnosis Date Noted   Counseling regarding advance care planning and goals of care 04/29/2018   Multiple myeloma ([place]) 04/29/2018   Bone metastases ([place]) 04/14/2018   Hypercalcemia 04/14/2018   Lytic bone lesions on xray 03/29/2018   Displaced fracture of lateral end of right clavicle with routine healing 03/29/2018   Fall 09/05/2017   Sepsis secondary to UTI ([place]) 09/05/2017   Urinary retention 09/05/2017   AKI (acute kidney injury) ([place]) 09/05/2017   CAD (coronary artery disease) 09/05/2017   Superficial vein  thrombosis 08/19/2017   Altered mental status    Metabolic encephalopathy 15/40/0867   Dementia ([place]) 08/04/2017   Seizure ([place]) 08/01/2017   Thiamine deficiency 05/27/2017   B12 deficiency 04/30/2017   Alcoholism ([place]) 01/28/2017   Acute encephalopathy 12/28/2016   Hepatic steatosis 06/05/2016   Physical deconditioning 06/05/2016   Chronic diastolic congestive heart failure ([place]) 06/05/2016   Orthostatic syncope    Elevated liver function tests    LOC (loss of consciousness) ([place]) 08/10/2014   CIDP (chronic inflammatory demyelinating polyneuropathy) ([place]) 08/08/2014   Sustained SVT ([place]) 07/27/2014   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   Abnormal LFTs 08/06/2013   Iron deficiency anemia 04/19/2013   Muscle weakness 01/13/2013   External hemorrhoids 03/23/2011   Symptomatic bradycardia 10/25/2008   NEPHROLITHIASIS 10/25/2008   PACEMAKER, PERMANENT 10/25/2008   Essential hypertension 11/21/2006   BPH (benign prostatic hyperplasia) 11/21/2006   History of colonic polyps 11/21/2006    Past Surgical History:  Procedure Laterality Date   COLONOSCOPY  2004,2009   PACEMAKER INSERTION  2005   PERMANENT PACEMAKER GENERATOR CHANGE N/A 06/19/2012   Procedure: PERMANENT PACEMAKER GENERATOR CHANGE;  Surgeon: [name], MD; Medtronic Adaptic L dual-chamber pacemaker, serial #[id number]     SKIN GRAFT Right 1962   wrist        Home Medications    Prior to Admission medications   Medication Sig Start Date End Date Taking? Authorizing Provider  acyclovir (ZOVIRAX) 400 MG tablet Take 1 tablet (400 mg total) by mouth 2 (two) times  daily. 05/01/18   Brunetta Genera, MD  aspirin EC 81 MG tablet Take 81 mg by mouth daily.    [provider]  dexamethasone (DECADRON) 4 MG tablet 93m (5 tabs) with breakfast the day after each Daratumumab infusion 05/01/18   KBrunetta Genera MD  flecainide (TAMBOCOR) 50 MG tablet Take 75 mg by mouth  daily as needed.    [provider]  furosemide (LASIX) 40 MG tablet Take 40 mg by mouth daily.    [provider]  irbesartan (AVAPRO) 300 MG tablet Take 300 mg by mouth daily.    [provider]  Melatonin 3 MG CAPS Take 3 mg by mouth at bedtime.    [provider]  memantine (NAMENDA) 5 MG tablet Take 5 mg by mouth 2 (two) times daily.    [provider]  metoprolol tartrate (LOPRESSOR) 25 MG tablet Take 1 tablet (25 mg total) by mouth 2 (two) times daily. 08/16/17   SDoreatha Lew MD  ondansetron (ZOFRAN) 8 MG tablet Take 1 tablet (8 mg total) by mouth 2 (two) times daily as needed (Nausea or vomiting). 05/01/18   KBrunetta Genera MD  oseltamivir (TAMIFLU) 75 MG capsule Take 1 capsule (75 mg total) by mouth 2 (two) times daily. 07/24/18   PCharlesetta Shanks MD  polyethylene glycol (MIRALAX / GLYCOLAX) packet Take 17 g by mouth daily. 08/17/17   SDoreatha Lew MD  prochlorperazine (COMPAZINE) 10 MG tablet Take 1 tablet (10 mg total) by mouth every 6 (six) hours as needed (Nausea or vomiting). 05/01/18   KBrunetta Genera MD  Saccharomyces boulardii (FLORASTOR PO) Take 250 mg by mouth 2 (two) times daily.    [provider]  tamsulosin (FLOMAX) 0.4 MG CAPS capsule Take 2 capsules (0.8 mg total) by mouth daily. 08/16/17   SDoreatha Lew MD  traMADol (ULTRAM) 50 MG tablet Take 1 tablet( 50 mg total) by mouth every 8 hrs as needed for pain 04/16/18   BBillie Ruddy MD  vitamin B-12 (CYANOCOBALAMIN) 500 MCG tablet Take 1,000 mcg by mouth daily.    [provider]  Vitamin D, Ergocalciferol, (DRISDOL) 50000 units CAPS capsule Take 50,000 Units by mouth every 7 (seven) days.    [provider]    Family History Family History  Problem Relation Age of Onset   Heart attack Father        Died, 664  Kidney disease Father    Parkinson's disease Mother        Died, 820  Breast cancer Sister        Living,  783  Diabetes type II Brother        Living, 637  Breast cancer Sister        Living, 69  Diabetes Daughter        Living, 51  Diabetes Son        Living, 569  Ovarian cancer Daughter    Colon cancer Neg Hx    Rectal cancer Neg Hx    Stomach cancer Neg Hx     Social History Social History   Tobacco Use   Smoking status: Former Smoker    Packs/day: 0.25    Years: 46.00    Pack years: 11.50    Types: Cigarettes    Start date: 03/16/1965    Last attempt to quit: 06/15/2010    Years since quitting: 8.1   Smokeless tobacco: Never Used  Tobacco comment: quit 3 years ago  Substance Use Topics   Alcohol use: No    Alcohol/week: 0.0 standard drinks   Drug use: No     Allergies   Lexapro [escitalopram oxalate]   Review of Systems Review of Systems 10 Systems reviewed and are negative for acute change except as noted in the HPI.   Physical Exam Updated Vital Signs BP (!) 167/104 (BP Location: Left Arm)    Pulse 85    Temp 99.1 F (37.3 C) (Oral)    Resp 16    Ht '5\' 11"'  (1.803 m)    Wt 90.7 kg    SpO2 99%    BMI 27.89 kg/m   Physical Exam Constitutional:      Comments: Patient is alert and nontoxic.  Mental status clear.  No respiratory distress.  Patient has a clinically well appearance.  HENT:     Head: Normocephalic and atraumatic.     Mouth/Throat:     Mouth: Mucous membranes are moist.     Pharynx: Oropharynx is clear.  Eyes:     Extraocular Movements: Extraocular movements intact.  Cardiovascular:     Rate and Rhythm: Normal rate and regular rhythm.  Pulmonary:     Effort: Pulmonary effort is normal.     Comments: Patient has a few expiratory wheezes in the right midlung field.  Otherwise good airflow without rhonchi. Abdominal:     General: There is no distension.     Palpations: Abdomen is soft.     Tenderness: There is no abdominal tenderness. There is no guarding.  Musculoskeletal: Normal range of motion.        General: No swelling or  tenderness.     Comments: Some chronic venous stasis but no significant swelling.  Patient is wearing his compression hose.  No calf tenderness.  Skin:    General: Skin is warm and dry.  Neurological:     General: No focal deficit present.     Mental Status: He is oriented to person, place, and time.     Coordination: Coordination normal.  Psychiatric:        Mood and Affect: Mood normal.      ED Treatments / Results  Labs (all labs ordered are listed, but only abnormal results are displayed) Labs Reviewed  BASIC METABOLIC PANEL - Abnormal; Notable for the following components:      Result Value   Glucose, Bld 102 (*)    Calcium 8.1 (*)    All other components within normal limits  CBC WITH DIFFERENTIAL/PLATELET - Abnormal; Notable for the following components:   Hemoglobin 11.7 (*)    HCT 38.0 (*)    RDW 16.1 (*)    Platelets 125 (*)    Monocytes Absolute 1.1 (*)    All other components within normal limits  MAGNESIUM  PHOSPHORUS    EKG None  Radiology Dg Chest 2 View  Result Date: 07/23/2018 CLINICAL DATA:  83 year old male with fever and cough. Plasma cell neoplasm. EXAM: CHEST - 2 VIEW COMPARISON:  PET-CT 04/23/2018 and earlier. FINDINGS: Stable left chest cardiac pacemaker and mediastinal contours with borderline to mild cardiomegaly. Tortuous thoracic aorta. Visualized tracheal air column is within normal limits. Left nipple shadow suspected. Otherwise the lungs appear clear. No pneumothorax or pleural effusion. Pathologic appearing fractures of the distal right clavicle, posterior right 2nd 5th and 6th ribs, lateral right 3rd and 4th ribs. Negative visible bowel gas pattern. IMPRESSION: 1.  No acute cardiopulmonary abnormality. 2. Multiple  right rib and clavicle pathologic fractures. Innumerable lytic bone lesions demonstrated by PET in December 2019. Electronically Signed   By: Genevie Ann M.D.   On: 07/23/2018 19:31    Procedures Procedures (including critical care  time)  Medications Ordered in ED Medications  oseltamivir (TAMIFLU) capsule 75 mg (75 mg Oral Given 07/23/18 1927)     Initial Impression / Assessment and Plan / ED Course  I have reviewed the triage vital signs and the nursing notes.  Pertinent labs & imaging results that were available during my care of the patient were reviewed by me and considered in my medical decision making (see chart for details).        Patient is alert and without distress.  He does not have respiratory distress.  He has had 2 days or less of cough and now has a documented fever as an outpatient of 101.  He has a close contact influenza B positive at his assisted living.  At this time, patient's chest x-ray does not show any focal pneumonia.  He does not have leukocytosis.  He appears stable to start outpatient treatment.  He will be started on Tamiflu.  Return precautions reviewed.  Final Clinical Impressions(s) / ED Diagnoses   Final diagnoses:  Influenza-like illness    ED Discharge Orders         Ordered    oseltamivir (TAMIFLU) 75 MG capsule  2 times daily     07/23/18 2006           Charlesetta Shanks, MD 07/23/18 2012

## 2018-07-23 NOTE — ED Triage Notes (Signed)
Pt c/o cough fever x 2 days-pt with +flu exposure-NAD-steady gait

## 2018-07-23 NOTE — Discharge Instructions (Signed)
1.  Start Tamiflu tomorrow morning. 2.  Take acetaminophen every 4-6 hours for fever or body ache. 3.  Return to the emergency department if you develop shortness of breath, confusion, worsening symptoms or other concerns.

## 2018-07-27 ENCOUNTER — Other Ambulatory Visit: Payer: Self-pay

## 2018-07-27 ENCOUNTER — Inpatient Hospital Stay: Payer: Medicare Other

## 2018-07-27 VITALS — BP 142/75 | HR 70 | Temp 97.9°F | Resp 16

## 2018-07-27 DIAGNOSIS — C9000 Multiple myeloma not having achieved remission: Secondary | ICD-10-CM

## 2018-07-27 DIAGNOSIS — Z7189 Other specified counseling: Secondary | ICD-10-CM

## 2018-07-27 DIAGNOSIS — M545 Low back pain: Secondary | ICD-10-CM | POA: Diagnosis not present

## 2018-07-27 DIAGNOSIS — Z5112 Encounter for antineoplastic immunotherapy: Secondary | ICD-10-CM | POA: Diagnosis not present

## 2018-07-27 DIAGNOSIS — R0789 Other chest pain: Secondary | ICD-10-CM | POA: Diagnosis not present

## 2018-07-27 DIAGNOSIS — C7951 Secondary malignant neoplasm of bone: Secondary | ICD-10-CM | POA: Diagnosis not present

## 2018-07-27 LAB — CMP (CANCER CENTER ONLY)
ALT: 14 U/L (ref 0–44)
AST: 20 U/L (ref 15–41)
Albumin: 3.1 g/dL — ABNORMAL LOW (ref 3.5–5.0)
Alkaline Phosphatase: 91 U/L (ref 38–126)
Anion gap: 10 (ref 5–15)
BUN: 17 mg/dL (ref 8–23)
CO2: 24 mmol/L (ref 22–32)
CREATININE: 0.93 mg/dL (ref 0.61–1.24)
Calcium: 7.7 mg/dL — ABNORMAL LOW (ref 8.9–10.3)
Chloride: 105 mmol/L (ref 98–111)
Glucose, Bld: 97 mg/dL (ref 70–99)
Potassium: 3.8 mmol/L (ref 3.5–5.1)
Sodium: 139 mmol/L (ref 135–145)
Total Bilirubin: 0.8 mg/dL (ref 0.3–1.2)
Total Protein: 6.1 g/dL — ABNORMAL LOW (ref 6.5–8.1)

## 2018-07-27 LAB — CBC WITH DIFFERENTIAL/PLATELET
Abs Immature Granulocytes: 0.01 10*3/uL (ref 0.00–0.07)
Basophils Absolute: 0 10*3/uL (ref 0.0–0.1)
Basophils Relative: 1 %
Eosinophils Absolute: 0.3 10*3/uL (ref 0.0–0.5)
Eosinophils Relative: 6 %
HEMATOCRIT: 38.7 % — AB (ref 39.0–52.0)
HEMOGLOBIN: 12.3 g/dL — AB (ref 13.0–17.0)
Immature Granulocytes: 0 %
LYMPHS PCT: 42 %
Lymphs Abs: 2.4 10*3/uL (ref 0.7–4.0)
MCH: 27 pg (ref 26.0–34.0)
MCHC: 31.8 g/dL (ref 30.0–36.0)
MCV: 85.1 fL (ref 80.0–100.0)
Monocytes Absolute: 0.6 10*3/uL (ref 0.1–1.0)
Monocytes Relative: 11 %
Neutro Abs: 2.2 10*3/uL (ref 1.7–7.7)
Neutrophils Relative %: 40 %
Platelets: 119 10*3/uL — ABNORMAL LOW (ref 150–400)
RBC: 4.55 MIL/uL (ref 4.22–5.81)
RDW: 15.7 % — ABNORMAL HIGH (ref 11.5–15.5)
WBC: 5.5 10*3/uL (ref 4.0–10.5)
nRBC: 0 % (ref 0.0–0.2)

## 2018-07-27 MED ORDER — FAMOTIDINE IN NACL 20-0.9 MG/50ML-% IV SOLN: 20.0000 mg | Freq: Once | INTRAVENOUS | Status: DC

## 2018-07-27 MED ORDER — MONTELUKAST SODIUM 10 MG PO TABS
10.0000 mg | ORAL_TABLET | Freq: Once | ORAL | Status: AC
  Administered 2018-07-27: 10 mg via ORAL

## 2018-07-27 MED ORDER — SODIUM CHLORIDE 0.9 % IV SOLN
16.2000 mg/kg | Freq: Once | INTRAVENOUS | Status: AC
  Administered 2018-07-27: 1400 mg via INTRAVENOUS
  Filled 2018-07-27: qty 60

## 2018-07-27 MED ORDER — ACETAMINOPHEN 325 MG PO TABS
650.0000 mg | ORAL_TABLET | Freq: Once | ORAL | Status: AC
  Administered 2018-07-27: 650 mg via ORAL

## 2018-07-27 MED ORDER — SODIUM CHLORIDE 0.9 % IV SOLN
Freq: Once | INTRAVENOUS | Status: AC
  Administered 2018-07-27: 10:00:00 via INTRAVENOUS
  Filled 2018-07-27: qty 250

## 2018-07-27 MED ORDER — ACETAMINOPHEN 325 MG PO TABS
ORAL_TABLET | ORAL | Status: AC
  Filled 2018-07-27: qty 2

## 2018-07-27 MED ORDER — DIPHENHYDRAMINE HCL 25 MG PO CAPS
ORAL_CAPSULE | ORAL | Status: AC
  Filled 2018-07-27: qty 2

## 2018-07-27 MED ORDER — SODIUM CHLORIDE 0.9 % IV SOLN
20.0000 mg | Freq: Once | INTRAVENOUS | Status: AC
  Administered 2018-07-27: 20 mg via INTRAVENOUS
  Filled 2018-07-27: qty 2

## 2018-07-27 MED ORDER — DIPHENHYDRAMINE HCL 25 MG PO CAPS
50.0000 mg | ORAL_CAPSULE | Freq: Once | ORAL | Status: AC
  Administered 2018-07-27: 50 mg via ORAL

## 2018-07-27 MED ORDER — MONTELUKAST SODIUM 10 MG PO TABS
ORAL_TABLET | ORAL | Status: AC
  Filled 2018-07-27: qty 1

## 2018-07-27 MED ORDER — SODIUM CHLORIDE 0.9 % IV SOLN
20.0000 mg | Freq: Once | INTRAVENOUS | Status: AC
  Administered 2018-07-27: 20 mg via INTRAVENOUS
  Filled 2018-07-27: qty 20

## 2018-07-27 NOTE — Patient Instructions (Signed)
[facility] Discharge Instructions for Patients Receiving Chemotherapy  Today you received the following chemotherapy agents :  daratumumab (Darzalex).  To help prevent nausea and vomiting after your treatment, we encourage you to take your nausea medication as prescribed.   If you develop nausea and vomiting that is not controlled by your nausea medication, call the clinic.   BELOW ARE SYMPTOMS THAT SHOULD BE REPORTED IMMEDIATELY:  *FEVER GREATER THAN 100.5 F  *CHILLS WITH OR WITHOUT FEVER  NAUSEA AND VOMITING THAT IS NOT CONTROLLED WITH YOUR NAUSEA MEDICATION  *UNUSUAL SHORTNESS OF BREATH  *UNUSUAL BRUISING OR BLEEDING  TENDERNESS IN MOUTH AND THROAT WITH OR WITHOUT PRESENCE OF ULCERS  *URINARY PROBLEMS  *BOWEL PROBLEMS  UNUSUAL RASH Items with * indicate a potential emergency and should be followed up as soon as possible.  Feel free to call the clinic should you have any questions or concerns. The clinic phone number is [phone].  Please show the CHEMO ALERT CARD at check-in to the Emergency Department and triage nurse.   

## 2018-07-31 ENCOUNTER — Telehealth: Payer: Self-pay | Admitting: *Deleted

## 2018-07-31 ENCOUNTER — Telehealth: Payer: Self-pay | Admitting: Hematology

## 2018-07-31 NOTE — Telephone Encounter (Signed)
Called regarding 3/30

## 2018-07-31 NOTE — Telephone Encounter (Signed)
Daughter called in response to screening call. Father lives in [place]. Was exposed to flu positive resident 2 weeks ago. Father had congestion and cough, however tested negative. He did receive tamiflu. He does not have symptoms at this time. Information given to Dr. Irene Limbo. Dr. Irene Limbo advised that patient can cancel Monday and add MD appt to lab/infusion on 3/30. Contacted daughter with information. She verbalized understanding and appreciation. Msg sent to scheduling

## 2018-07-31 NOTE — Telephone Encounter (Signed)
Attempted to contact patient/daughter. Left voice mail regarding COVID-19 screening questions prior to appt on Monday. If answer to any question is yes, please contact office [phone]. Advised that will also be screened at frond desk prior to appt.

## 2018-08-03 ENCOUNTER — Inpatient Hospital Stay: Payer: Medicare Other

## 2018-08-03 ENCOUNTER — Inpatient Hospital Stay: Payer: Medicare Other | Admitting: Hematology

## 2018-08-06 NOTE — Progress Notes (Signed)
HEMATOLOGY/ONCOLOGY CLINIC NOTE  Date of Service: 08/10/2018  Patient Care Team: [name], MD as PCP - General (Family Medicine) [name], MD (Cardiology) [name], MD (Cardiology)  CHIEF COMPLAINTS/PURPOSE OF CONSULTATION:  Recently diagnosed myeloma  HISTORY OF PRESENTING ILLNESS:   Jonathon [name] is a wonderful 83 y.o. male who has been referred to [place] by Dr. [name] for evaluation and management of Lytic bone lesions. He is accompanied today by his daughter. The pt reports that he is doing well overall. He is currently a resident at [facility] in [place], [place].   The pt presented to the ED on 03/24/18 with a right distal clavicle fracture, which occurred after trying to get out of bed and pushing himself up with his right arm. He notes that his energy has been noticeably decreased for the last month. He also notes that his lower back has a slight sting when trying to stand up from sitting. He also notes some pain in his lower, right chest wall, which is exacerbated when coughing. The pt has been taking 61m Tramadol which is controlling his pain. He does endorse some weakening of both of his lower extremities in the past 1-2 months, which he attributes somewhat to his recent lower back pain.   The pt denies any bowel abnormalities recently. He notes that he stays very well hydrated, and urinates 4-5 times a night, and does have BPH, and takes Flomax. The pt denies any recent changes in his urination habits.   The pt notes that his appetite decreased for a few months, until he began assisted living. He has lost 9 pounds in the last week.   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 He now uses a walker to ambulate. He notes that the CIDP symptoms are primarily located to his legs. He sees Dr. [name] [facility]. He took  steroids without any symptomatic relief. IVIG was cost-prohibitive for the patient, but was recommended.    Regarding the patient's anemia in April 2019, he denies any obvious bleeding at that time, and denies surgeries.    The pt notes that his last colonoscopy, in 2015, was not concerning, which is corroborated by chart review.   The pt and daughter deny any concerns for memory issues, and the daughter adds his memory is "elephant-like". They are not sure why Namenda is on his medication list.   The pt denies any recent dental procedures, or concerns for dental issues. He has maintained every 6 month dental cleanings for all of his life.   Of note prior to the patient's visit today, pt has had a CT C/A/P completed on 03/31/18 with results revealing IMPRESSION: 1. Lytic lesion throughout all visualized vertebra from the lower cervical spine through the sacrum. Epidural extension of tumor T2, T3 and L2 level. Right L3-4 foraminal extension tumor. 2. Lytic lesions involving majority of ribs. Multiple pathologic rib fractures with bony expansion/sub pleural extension of tumor at several levels. 3. Lytic lesions of the scapula, hips bilaterally and pelvis bilaterally. With further progression of tumor, patient may be at risk for pathologic fractures. Pathologic fracture right clavicle incompletely assessed. 4. Circumferential narrowing sigmoid colon. Question possibility of underlying mass (series 2, image 97). Alternatively this could represent muscular hypertrophy/peristalsis. 5. Gastric antrum circumferential narrowing. Cannot exclude mass although this may  be related to peristalsis. 6. No primary lung malignancy noted. 7. Matted aortic pulmonary window lymph nodes. 8. Circumferential urinary bladder wall thickening greater anterior dome region. Right lateral bladder diverticulum. 9. 5 mm low-density lesion pancreatic body (series 5, image 10) unchanged. This is felt unlikely to be a primary  malignancy. 10. Right parapelvic 2.2 cm cyst without significant change. Scattered low-density renal lesions bila2terally too small to adequately characterize. Some were present previously. Statistically these are likely cysts. 11. Stable adrenal gland hyperplasia. 12. Gynecomastia. 13. Prostate gland causes slight impression upon the bladder base. 14.  Aortic Atherosclerosis. 15. Sequential pacemaker in place. 16. Gallstones.  Most recent lab results (03/25/18) of CBC w/diff and CMP is as follows: all values are WNL except for RBC at 4.14, HGB at 11.3, HCT at 33.6, Chloride at 94, CO2 at 33, Glucose at 100, BUN at 25, Total Protein at 9.4, Albumin at 3.4, Calcium at 13.8, GFR at 58. 03/25/18 PSA, Total was normal at 1.2  On review of systems, pt reports positional lower back pain, lower right chest wall pain, recently decreased energy levels, relatively weaker appetite, weight loss, right clavicle pain, weakening of the lower extremities, moving his bowels well, and denies changes in urination, abdominal pains, changes in breathing, changes in bowel habits, pain along the spine, leg swelling, and any other symptoms.  Interval History:   [name] returns today for management, evaluation, and C4D15 treatment of his Multiple Myeloma. The patient's last visit with [place] was on 07/06/18. The pt reports that he is doing well overall.   The pt reports that he has enjoyed stable energy levels, has been eating well, and has been moving his bowels well. The pt denies any fevers, concerns of infections, or new bone pains. He has continued to bike 20-30 minutes each day with his recombinant bicycle. The pt notes that he has begun drinking whole milk every day in the interim.  Lab results today (08/10/18) of CBC w/diff and CMP is as follows: all values are WNL except for HGB at 12.9, Calcium at 8.7.  On review of systems, pt reports good energy levels, eating well, moving his bowels well, and denies fevers, new  bone pains, concerns for infections, mouth sores, pain along the spine, hip pains, and any other symptoms.  MEDICAL HISTORY:  Past Medical History:  Diagnosis Date   Asthma    BENIGN PROSTATIC HYPERTROPHY, HX OF 10/25/2008   Bone metastases ([place]) 04/14/2018   BRADYCARDIA 2005   Chronic diastolic congestive heart failure ([place]) 06/05/2016   CIDP (chronic inflammatory demyelinating polyneuropathy) ([place])    HYPERTENSION 11/21/2006   Iron deficiency anemia, unspecified 04/19/2013   Multiple myeloma ([place]) 04/29/2018   NEPHROLITHIASIS, HX OF 11/21/2006 and 10/15/2008   PACEMAKER, PERMANENT 2005   Gen change 2014 Medtronic Adaptic L dual-chamber pacemaker, serial #[id number]    SVT (supraventricular tachycardia) ([place])    TOBACCO ABUSE 10/25/2008   Quit 2012    SURGICAL HISTORY: Past Surgical History:  Procedure Laterality Date   COLONOSCOPY  2004,2009   PACEMAKER INSERTION  2005   PERMANENT PACEMAKER GENERATOR CHANGE N/A 06/19/2012   Procedure: PERMANENT PACEMAKER GENERATOR CHANGE;  Surgeon: [name], MD; Medtronic Adaptic L dual-chamber pacemaker, serial #[id number]     SKIN GRAFT Right 1962   wrist    SOCIAL HISTORY: Social History   Socioeconomic History   Marital status: Divorced    Spouse name: Not on file   Number of children: 4   Years of  education: Not on file   Highest education level: Not on file  Occupational History   Occupation: ENVIROMENTAL SERVICE    Employer: [employer]   Occupation: retired  Scientist, product/process development strain: Not on file   Food insecurity:    Worry: Not on file    Inability: Not on file   Transportation needs:    Medical: Not on file    Non-medical: Not on file  Tobacco Use   Smoking status: Former Smoker    Packs/day: 0.25    Years: 46.00    Pack years: 11.50    Types: Cigarettes    Start date: 03/16/1965    Last attempt to quit: 06/15/2010    Years since quitting: 8.1   Smokeless  tobacco: Never Used   Tobacco comment: quit 3 years ago  Substance and Sexual Activity   Alcohol use: No    Alcohol/week: 0.0 standard drinks   Drug use: No   Sexual activity: Not on file  Lifestyle   Physical activity:    Days per week: Not on file    Minutes per session: Not on file   Stress: Not on file  Relationships   Social connections:    Talks on phone: Not on file    Gets together: Not on file    Attends religious service: Not on file    Active member of club or organization: Not on file    Attends meetings of clubs or organizations: Not on file    Relationship status: Not on file   Intimate partner violence:    Fear of current or ex partner: Not on file    Emotionally abused: Not on file    Physically abused: Not on file    Forced sexual activity: Not on file  Other Topics Concern   Not on file  Social History Narrative   Has relocated from [place] in 2007. 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    FAMILY HISTORY: Family History  Problem Relation Age of Onset   Heart attack Father        Died, 37   Kidney disease Father    Parkinson's disease Mother        Died, 3   Breast cancer Sister        Living, 99   Diabetes type II Brother        Living, 25   Breast cancer Sister        Living, 82   Diabetes Daughter        Living, 63   Diabetes Son        Living, 22   Ovarian cancer Daughter    Colon cancer Neg Hx    Rectal cancer Neg Hx    Stomach cancer Neg Hx     ALLERGIES:  is allergic to lexapro [escitalopram oxalate].  MEDICATIONS:  Current Outpatient Medications  Medication Sig Dispense Refill   acyclovir (ZOVIRAX) 400 MG tablet Take 1 tablet (400 mg total) by mouth 2 (two) times daily. 60 tablet 11   aspirin EC 81 MG tablet Take 81 mg by mouth daily.     dexamethasone (DECADRON) 4 MG tablet 5m (5 tabs) with breakfast the day after each Daratumumab infusion 20 tablet 4   flecainide (TAMBOCOR) 50 MG tablet  Take 75 mg by mouth daily as needed.     furosemide (LASIX) 40 MG tablet Take 40 mg by mouth daily.     irbesartan (  AVAPRO) 300 MG tablet Take 300 mg by mouth daily.     Melatonin 3 MG CAPS Take 3 mg by mouth at bedtime.     memantine (NAMENDA) 5 MG tablet Take 5 mg by mouth 2 (two) times daily.     metoprolol tartrate (LOPRESSOR) 25 MG tablet Take 1 tablet (25 mg total) by mouth 2 (two) times daily.     ondansetron (ZOFRAN) 8 MG tablet Take 1 tablet (8 mg total) by mouth 2 (two) times daily as needed (Nausea or vomiting). 30 tablet 1   oseltamivir (TAMIFLU) 75 MG capsule Take 1 capsule (75 mg total) by mouth 2 (two) times daily. 9 capsule 0   polyethylene glycol (MIRALAX / GLYCOLAX) packet Take 17 g by mouth daily. 14 each 0   prochlorperazine (COMPAZINE) 10 MG tablet Take 1 tablet (10 mg total) by mouth every 6 (six) hours as needed (Nausea or vomiting). 30 tablet 1   Saccharomyces boulardii (FLORASTOR PO) Take 250 mg by mouth 2 (two) times daily.     tamsulosin (FLOMAX) 0.4 MG CAPS capsule Take 2 capsules (0.8 mg total) by mouth daily. 90 capsule 0   traMADol (ULTRAM) 50 MG tablet Take 1 tablet( 50 mg total) by mouth every 8 hrs as needed for pain 90 tablet 2   vitamin B-12 (CYANOCOBALAMIN) 500 MCG tablet Take 1,000 mcg by mouth daily.     Vitamin D, Ergocalciferol, (DRISDOL) 50000 units CAPS capsule Take 50,000 Units by mouth every 7 (seven) days.     No current facility-administered medications for this visit.     REVIEW OF SYSTEMS:    A 10+ POINT REVIEW OF SYSTEMS WAS OBTAINED including neurology, dermatology, psychiatry, cardiac, respiratory, lymph, extremities, GI, GU, Musculoskeletal, constitutional, breasts, reproductive, HEENT.  All pertinent positives are noted in the HPI.  All others are negative.   PHYSICAL EXAMINATION: ECOG PERFORMANCE STATUS: 2 - Symptomatic, <50% confined to bed  Vitals:   08/10/18 0852  BP: (!) 158/95  Pulse: 73  Resp: 18  Temp: (!) 97.5  F (36.4 C)  SpO2: 100%   Filed Weights   08/10/18 0852  Weight: 198 lb 9.6 oz (90.1 kg)   .Body mass index is 27.7 kg/m.  GENERAL:alert, in no acute distress and comfortable SKIN: no acute rashes, no significant lesions EYES: conjunctiva are pink and non-injected, sclera anicteric OROPHARYNX: MMM, no exudates, no oropharyngeal erythema or ulceration NECK: supple, no JVD LYMPH:  no palpable lymphadenopathy in the cervical, axillary or inguinal regions LUNGS: clear to auscultation b/l with normal respiratory effort HEART: regular rate & rhythm ABDOMEN:  normoactive bowel sounds , non tender, not distended. No palpable hepatosplenomegaly.  Extremity: no pedal edema PSYCH: alert & oriented x 3 with fluent speech NEURO: no focal motor/sensory deficits   LABORATORY DATA:  I have reviewed the data as listed  . CBC Latest Ref Rng & Units 08/10/2018 07/27/2018 07/23/2018  WBC 4.0 - 10.5 K/uL 6.3 5.5 6.6  Hemoglobin 13.0 - 17.0 g/dL 12.9(L) 12.3(L) 11.7(L)  Hematocrit 39.0 - 52.0 % 40.4 38.7(L) 38.0(L)  Platelets 150 - 400 K/uL 163 119(L) 125(L)    . CMP Latest Ref Rng & Units 08/10/2018 07/27/2018 07/23/2018  Glucose 70 - 99 mg/dL 93 97 102(H)  BUN 8 - 23 mg/dL '16 17 17  ' Creatinine 0.61 - 1.24 mg/dL 1.06 0.93 1.04  Sodium 135 - 145 mmol/L 141 139 136  Potassium 3.5 - 5.1 mmol/L 4.2 3.8 4.0  Chloride 98 - 111 mmol/L 107 105 103  CO2 22 - 32 mmol/L '24 24 26  ' Calcium 8.9 - 10.3 mg/dL 8.7(L) 7.7(L) 8.1(L)  Total Protein 6.5 - 8.1 g/dL 6.5 6.1(L) -  Total Bilirubin 0.3 - 1.2 mg/dL 0.5 0.8 -  Alkaline Phos 38 - 126 U/L 93 91 -  AST 15 - 41 U/L 17 20 -  ALT 0 - 44 U/L 17 14 -   04/17/18 BM Bx:    RADIOGRAPHIC STUDIES: I have personally reviewed the radiological images as listed and agreed with the findings in the report. Dg Chest 2 View  Result Date: 07/23/2018 CLINICAL DATA:  83 year old male with fever and cough. Plasma cell neoplasm. EXAM: CHEST - 2 VIEW COMPARISON:  PET-CT  04/23/2018 and earlier. FINDINGS: Stable left chest cardiac pacemaker and mediastinal contours with borderline to mild cardiomegaly. Tortuous thoracic aorta. Visualized tracheal air column is within normal limits. Left nipple shadow suspected. Otherwise the lungs appear clear. No pneumothorax or pleural effusion. Pathologic appearing fractures of the distal right clavicle, posterior right 2nd 5th and 6th ribs, lateral right 3rd and 4th ribs. Negative visible bowel gas pattern. IMPRESSION: 1.  No acute cardiopulmonary abnormality. 2. Multiple right rib and clavicle pathologic fractures. Innumerable lytic bone lesions demonstrated by PET in December 2019. Electronically Signed   By: Genevie Ann M.D.   On: 07/23/2018 19:31    ASSESSMENT & PLAN:  83 y.o. male with  1. Lytic bone lesions - w/u consistent with newly diagnosed multiple myeloma Labs upon initial presentation from 04/14/18; hypercalcemic with Calcium at 13.8, renal function up form baseline with Creatinine at 1.48, HGB at 11.3. PSA was normal.   03/31/18 CT C/A/P revealed 1. Lytic lesion throughout all visualized vertebra from the lower cervical spine through the sacrum. Epidural extension of tumor T2, T3 and L2 level. Right L3-4 foraminal extension tumor. 2. Lytic lesions involving majority of ribs. Multiple pathologic rib fractures with bony expansion/sub pleural extension of tumor at several levels. 3. Lytic lesions of the scapula, hips bilaterally and pelvis bilaterally. With further progression of tumor, patient may be at risk for pathologic fractures. Pathologic fracture right clavicle incompletely assessed. 4. Circumferential narrowing sigmoid colon. Question possibility of underlying mass (series 2, image 97). Alternatively this could represent muscular hypertrophy/peristalsis. 5. Gastric antrum circumferential narrowing. Cannot exclude mass although this may be related to peristalsis. 6. No primary lung malignancy noted. 7. Matted aortic  pulmonary window lymph nodes. 8. Circumferential urinary bladder wall thickening greater anterior dome region. Right lateral bladder diverticulum. 9. 5 mm low-density lesion pancreatic body (series 5, image 10) unchanged. This is felt unlikely to be a primary malignancy. 10. Right parapelvic 2.2 cm cyst without significant change. Scattered low-density renal lesions bilaterally too small to adequately characterize. Some were present previously. Statistically these are likely cysts. 11. Stable adrenal gland hyperplasia. 12. Gynecomastia. 13. Prostate gland causes slight impression upon the bladder base. 14.  Aortic Atherosclerosis. 15. Sequential pacemaker in place. 16. Gallstones.   04/23/18 PET/CT revealed Innumerable hypermetabolic lytic lesions throughout the skeleton especially involving the spine, ribs, bony pelvis along with some involvement of both proximal femurs. The appearance is compatible with pathologic diagnosis of plasma cell neoplasm could well reflect multiple myeloma. Resulting pathologic fractures of the right lateral clavicle and of multiple bilateral ribs. Lesions are present along the cortical margins of the spinal canal. 2. Both the area and the stomach in the area in the sigmoid colon drawn attention to on prior CT scan appear benign normal today. 3. The confluence of  AP window lymph nodes measures about 1.2 cm in short axis with maximum SUV 3.1, which is mildly above the blood pool merit surveillance. 4. Other imaging findings of potential clinical significance: Aortic Atherosclerosis. Coronary atherosclerosis. Pacemaker noted. Borderline prostatomegaly. Cholelithiasis.  04/21/18 BM Bx revealed Normocellular bone marrow with Plasma Cell neoplasm. Scattered medium sized clusters of kappa-restricted plasma cells (14% aspirate, 10% CD138 immunohistochemistry.   I do suspect that the burden of disease is underestimated in the bone marrow sample given PET/CT findings of innumerable lytic  lesions and an M spike of 3.4g   06/08/18 DG Hips bilat which revealed Innumerable lucencies are noted throughout the pelvis and proximal femurs, similar findings noted on prior PET-CT of 04/23/2018. Findings are consistent patient's known multiple myeloma. Femoral necks are intact. 2.  Aortoiliac atherosclerotic vascular disease.  2. Pathologic right clavicle fracture due to myeloma 3. Hypercalcemia 03/25/18 CMP revealed Calcium at 13.8-- now down to 11.1 Will begin Xgeva injections, and strongly recommended keeping very well hydrated   PLAN:  -Discussed pt labwork today, 08/10/18; HGB has continued to improve now to 12.9, WBC normal, PLT normal. Chemistries are stable. -Last available MMP from 07/20/18 revealed M Protein at 0.2g, consistent with very good partial response.  -Discussed that due to the low IgG Kappa M spike, pt could now be in remission, as his M Protein has IgG kappa specificity, which is the same as the Daratumumab antibody. -The pt has no prohibitive toxicities from continuing C4D15 Daratumumab and Dexamethasone at this time. -No strong indication to add on additional medication like Revlimid at this time -Discussed the option to pursue transplant consideration vs maintenance therapy after completing induction. The pt notes that he is very comfortable with not pursuing transplant, and is appreciating the current preservation of his quality of life without intrusive therapies. -Will recheck Vitamin D levels and will adjust replacement as indicated -CIDP has been considered in the treatment choice -Continue Xgeva every 4 weeks -Again advised that the pt limit his physical stressors, and limit bending, pushing, pulling and stairs to limit risk of further pathologic bone fractures -Will see the pt back in 4 weeks   plz cancel labs appointment for 08/17/2018 Next labs and treatment as scheduled on 4/13 Plz schedule MD visit with labs and treatment on 4/27 (ok to see in infusion if  needed) Continue [place] See q4weeks   All of the patients questions were answered with apparent satisfaction. The patient knows to call the clinic with any problems, questions or concerns.  The total time spent in the appt was 25 minutes and more than 50% was on counseling and direct patient cares.    Sullivan Lone MD MS AAHIVMS [facility] Hematology/Oncology Physician [facility]  (Office):       [phone] (Work cell):  [phone] (Fax):           [phone]  08/10/2018 9:33 AM  I, Baldwin [place], am acting as a scribe for Dr. Sullivan Lone.   .I have reviewed the above documentation for accuracy and completeness, and I agree with the above. Brunetta Genera MD

## 2018-08-10 ENCOUNTER — Inpatient Hospital Stay: Payer: Medicare Other

## 2018-08-10 ENCOUNTER — Other Ambulatory Visit: Payer: Self-pay

## 2018-08-10 ENCOUNTER — Inpatient Hospital Stay (HOSPITAL_BASED_OUTPATIENT_CLINIC_OR_DEPARTMENT_OTHER): Payer: Medicare Other | Admitting: Hematology

## 2018-08-10 ENCOUNTER — Telehealth: Payer: Self-pay | Admitting: Hematology

## 2018-08-10 VITALS — BP 158/95 | HR 73 | Temp 97.5°F | Resp 18 | Ht 71.0 in | Wt 198.6 lb

## 2018-08-10 VITALS — BP 144/78 | HR 67 | Resp 18

## 2018-08-10 DIAGNOSIS — C7951 Secondary malignant neoplasm of bone: Secondary | ICD-10-CM

## 2018-08-10 DIAGNOSIS — I110 Hypertensive heart disease with heart failure: Secondary | ICD-10-CM | POA: Diagnosis not present

## 2018-08-10 DIAGNOSIS — Z950 Presence of cardiac pacemaker: Secondary | ICD-10-CM | POA: Diagnosis not present

## 2018-08-10 DIAGNOSIS — Z79899 Other long term (current) drug therapy: Secondary | ICD-10-CM | POA: Diagnosis not present

## 2018-08-10 DIAGNOSIS — R0789 Other chest pain: Secondary | ICD-10-CM | POA: Diagnosis not present

## 2018-08-10 DIAGNOSIS — Z87891 Personal history of nicotine dependence: Secondary | ICD-10-CM

## 2018-08-10 DIAGNOSIS — Z7982 Long term (current) use of aspirin: Secondary | ICD-10-CM | POA: Diagnosis not present

## 2018-08-10 DIAGNOSIS — Z87311 Personal history of (healed) other pathological fracture: Secondary | ICD-10-CM

## 2018-08-10 DIAGNOSIS — I5032 Chronic diastolic (congestive) heart failure: Secondary | ICD-10-CM | POA: Diagnosis not present

## 2018-08-10 DIAGNOSIS — Z7189 Other specified counseling: Secondary | ICD-10-CM

## 2018-08-10 DIAGNOSIS — C9000 Multiple myeloma not having achieved remission: Secondary | ICD-10-CM

## 2018-08-10 DIAGNOSIS — Z5112 Encounter for antineoplastic immunotherapy: Secondary | ICD-10-CM | POA: Diagnosis not present

## 2018-08-10 DIAGNOSIS — M545 Low back pain: Secondary | ICD-10-CM

## 2018-08-10 LAB — CMP (CANCER CENTER ONLY)
ALT: 17 U/L (ref 0–44)
AST: 17 U/L (ref 15–41)
Albumin: 3.5 g/dL (ref 3.5–5.0)
Alkaline Phosphatase: 93 U/L (ref 38–126)
Anion gap: 10 (ref 5–15)
BUN: 16 mg/dL (ref 8–23)
CALCIUM: 8.7 mg/dL — AB (ref 8.9–10.3)
CO2: 24 mmol/L (ref 22–32)
Chloride: 107 mmol/L (ref 98–111)
Creatinine: 1.06 mg/dL (ref 0.61–1.24)
GFR, Est AFR Am: >60 mL/min (ref >60)
Glucose, Bld: 93 mg/dL (ref 70–99)
Potassium: 4.2 mmol/L (ref 3.5–5.1)
Sodium: 141 mmol/L (ref 135–145)
Total Bilirubin: 0.5 mg/dL (ref 0.3–1.2)
Total Protein: 6.5 g/dL (ref 6.5–8.1)

## 2018-08-10 LAB — CBC WITH DIFFERENTIAL/PLATELET
Abs Immature Granulocytes: 0.02 10*3/uL (ref 0.00–0.07)
Basophils Absolute: 0.1 10*3/uL (ref 0.0–0.1)
Basophils Relative: 1 %
Eosinophils Absolute: 0.4 10*3/uL (ref 0.0–0.5)
Eosinophils Relative: 6 %
HEMATOCRIT: 40.4 % (ref 39.0–52.0)
HEMOGLOBIN: 12.9 g/dL — AB (ref 13.0–17.0)
Immature Granulocytes: 0 %
LYMPHS ABS: 3.1 10*3/uL (ref 0.7–4.0)
LYMPHS PCT: 48 %
MCH: 27.4 pg (ref 26.0–34.0)
MCHC: 31.9 g/dL (ref 30.0–36.0)
MCV: 85.8 fL (ref 80.0–100.0)
MONOS PCT: 10 %
Monocytes Absolute: 0.6 10*3/uL (ref 0.1–1.0)
Neutro Abs: 2.2 10*3/uL (ref 1.7–7.7)
Neutrophils Relative %: 35 %
Platelets: 163 10*3/uL (ref 150–400)
RBC: 4.71 MIL/uL (ref 4.22–5.81)
RDW: 15.3 % (ref 11.5–15.5)
WBC: 6.3 10*3/uL (ref 4.0–10.5)
nRBC: 0 % (ref 0.0–0.2)

## 2018-08-10 MED ORDER — SODIUM CHLORIDE 0.9 % IV SOLN
16.2000 mg/kg | Freq: Once | INTRAVENOUS | Status: AC
  Administered 2018-08-10: 1400 mg via INTRAVENOUS
  Filled 2018-08-10: qty 60

## 2018-08-10 MED ORDER — SODIUM CHLORIDE 0.9 % IV SOLN
20.0000 mg | Freq: Once | INTRAVENOUS | Status: AC
  Administered 2018-08-10: 20 mg via INTRAVENOUS
  Filled 2018-08-10: qty 20

## 2018-08-10 MED ORDER — ACETAMINOPHEN 325 MG PO TABS
650.0000 mg | ORAL_TABLET | Freq: Once | ORAL | Status: AC
  Administered 2018-08-10: 650 mg via ORAL

## 2018-08-10 MED ORDER — ACETAMINOPHEN 325 MG PO TABS
ORAL_TABLET | ORAL | Status: AC
  Filled 2018-08-10: qty 2

## 2018-08-10 MED ORDER — MONTELUKAST SODIUM 10 MG PO TABS
10.0000 mg | ORAL_TABLET | Freq: Once | ORAL | Status: AC
  Administered 2018-08-10: 10 mg via ORAL

## 2018-08-10 MED ORDER — DIPHENHYDRAMINE HCL 25 MG PO CAPS
ORAL_CAPSULE | ORAL | Status: AC
  Filled 2018-08-10: qty 2

## 2018-08-10 MED ORDER — DIPHENHYDRAMINE HCL 25 MG PO CAPS
50.0000 mg | ORAL_CAPSULE | Freq: Once | ORAL | Status: AC
  Administered 2018-08-10: 50 mg via ORAL

## 2018-08-10 MED ORDER — SODIUM CHLORIDE 0.9 % IV SOLN
20.0000 mg | Freq: Once | INTRAVENOUS | Status: AC
  Administered 2018-08-10: 20 mg via INTRAVENOUS
  Filled 2018-08-10: qty 2

## 2018-08-10 MED ORDER — DENOSUMAB 120 MG/1.7ML ~~LOC~~ SOLN: 120.0000 mg | Freq: Once | SUBCUTANEOUS | Status: DC

## 2018-08-10 MED ORDER — MONTELUKAST SODIUM 10 MG PO TABS
ORAL_TABLET | ORAL | Status: AC
  Filled 2018-08-10: qty 1

## 2018-08-10 MED ORDER — SODIUM CHLORIDE 0.9 % IV SOLN
Freq: Once | INTRAVENOUS | Status: AC
  Administered 2018-08-10: 10:00:00 via INTRAVENOUS
  Filled 2018-08-10: qty 250

## 2018-08-10 MED ORDER — FAMOTIDINE IN NACL 20-0.9 MG/50ML-% IV SOLN: 20.0000 mg | Freq: Once | INTRAVENOUS | Status: DC

## 2018-08-10 NOTE — Telephone Encounter (Signed)
Scheduled appt per 3/30 los.  Printed calendar and avs, and took to patient in treatment area.

## 2018-08-10 NOTE — Patient Instructions (Signed)
[facility] Discharge Instructions for Patients Receiving Chemotherapy  Today you received the following chemotherapy agents :  daratumumab (Darzalex).  To help prevent nausea and vomiting after your treatment, we encourage you to take your nausea medication as prescribed.   If you develop nausea and vomiting that is not controlled by your nausea medication, call the clinic.   BELOW ARE SYMPTOMS THAT SHOULD BE REPORTED IMMEDIATELY:  *FEVER GREATER THAN 100.5 F  *CHILLS WITH OR WITHOUT FEVER  NAUSEA AND VOMITING THAT IS NOT CONTROLLED WITH YOUR NAUSEA MEDICATION  *UNUSUAL SHORTNESS OF BREATH  *UNUSUAL BRUISING OR BLEEDING  TENDERNESS IN MOUTH AND THROAT WITH OR WITHOUT PRESENCE OF ULCERS  *URINARY PROBLEMS  *BOWEL PROBLEMS  UNUSUAL RASH Items with * indicate a potential emergency and should be followed up as soon as possible.  Feel free to call the clinic should you have any questions or concerns. The clinic phone number is [phone].  Please show the CHEMO ALERT CARD at check-in to the Emergency Department and triage nurse.   

## 2018-08-17 ENCOUNTER — Other Ambulatory Visit: Payer: Self-pay

## 2018-08-20 DIAGNOSIS — M79675 Pain in left toe(s): Secondary | ICD-10-CM | POA: Diagnosis not present

## 2018-08-20 DIAGNOSIS — M79674 Pain in right toe(s): Secondary | ICD-10-CM | POA: Diagnosis not present

## 2018-08-20 DIAGNOSIS — B351 Tinea unguium: Secondary | ICD-10-CM | POA: Diagnosis not present

## 2018-08-24 ENCOUNTER — Inpatient Hospital Stay: Payer: Medicare Other

## 2018-08-24 ENCOUNTER — Inpatient Hospital Stay: Payer: Medicare Other | Attending: Hematology

## 2018-08-24 ENCOUNTER — Ambulatory Visit: Payer: Self-pay | Admitting: Neurology

## 2018-08-24 ENCOUNTER — Other Ambulatory Visit: Payer: Self-pay

## 2018-08-24 VITALS — BP 160/78 | HR 62 | Temp 98.0°F | Resp 18

## 2018-08-24 DIAGNOSIS — C9000 Multiple myeloma not having achieved remission: Secondary | ICD-10-CM | POA: Diagnosis not present

## 2018-08-24 DIAGNOSIS — Z803 Family history of malignant neoplasm of breast: Secondary | ICD-10-CM | POA: Insufficient documentation

## 2018-08-24 DIAGNOSIS — Z5112 Encounter for antineoplastic immunotherapy: Secondary | ICD-10-CM | POA: Diagnosis not present

## 2018-08-24 DIAGNOSIS — Z87891 Personal history of nicotine dependence: Secondary | ICD-10-CM | POA: Diagnosis not present

## 2018-08-24 DIAGNOSIS — Z7982 Long term (current) use of aspirin: Secondary | ICD-10-CM | POA: Diagnosis not present

## 2018-08-24 DIAGNOSIS — I10 Essential (primary) hypertension: Secondary | ICD-10-CM | POA: Diagnosis not present

## 2018-08-24 DIAGNOSIS — Z7189 Other specified counseling: Secondary | ICD-10-CM

## 2018-08-24 DIAGNOSIS — Z79899 Other long term (current) drug therapy: Secondary | ICD-10-CM | POA: Insufficient documentation

## 2018-08-24 DIAGNOSIS — C7951 Secondary malignant neoplasm of bone: Secondary | ICD-10-CM

## 2018-08-24 LAB — CMP (CANCER CENTER ONLY)
ALT: 12 U/L (ref 0–44)
AST: 18 U/L (ref 15–41)
Albumin: 3.3 g/dL — ABNORMAL LOW (ref 3.5–5.0)
Alkaline Phosphatase: 84 U/L (ref 38–126)
Anion gap: 11 (ref 5–15)
BUN: 18 mg/dL (ref 8–23)
CO2: 21 mmol/L — ABNORMAL LOW (ref 22–32)
Calcium: 8.8 mg/dL — ABNORMAL LOW (ref 8.9–10.3)
Chloride: 107 mmol/L (ref 98–111)
Creatinine: 1.09 mg/dL (ref 0.61–1.24)
GFR, Est AFR Am: >60 mL/min (ref >60)
GFR, Estimated: >60 mL/min (ref >60)
Glucose, Bld: 103 mg/dL — ABNORMAL HIGH (ref 70–99)
Potassium: 4.2 mmol/L (ref 3.5–5.1)
Sodium: 139 mmol/L (ref 135–145)
Total Bilirubin: 0.5 mg/dL (ref 0.3–1.2)
Total Protein: 6.4 g/dL — ABNORMAL LOW (ref 6.5–8.1)

## 2018-08-24 LAB — CBC WITH DIFFERENTIAL/PLATELET
Abs Immature Granulocytes: 0.01 10*3/uL (ref 0.00–0.07)
Basophils Absolute: 0.1 10*3/uL (ref 0.0–0.1)
Basophils Relative: 1 %
Eosinophils Absolute: 0.5 10*3/uL (ref 0.0–0.5)
Eosinophils Relative: 8 %
HCT: 39 % (ref 39.0–52.0)
Hemoglobin: 12.4 g/dL — ABNORMAL LOW (ref 13.0–17.0)
Immature Granulocytes: 0 %
Lymphocytes Relative: 38 %
Lymphs Abs: 2.2 10*3/uL (ref 0.7–4.0)
MCH: 27.1 pg (ref 26.0–34.0)
MCHC: 31.8 g/dL (ref 30.0–36.0)
MCV: 85.3 fL (ref 80.0–100.0)
Monocytes Absolute: 0.6 10*3/uL (ref 0.1–1.0)
Monocytes Relative: 9 %
Neutro Abs: 2.6 10*3/uL (ref 1.7–7.7)
Neutrophils Relative %: 44 %
Platelets: 124 10*3/uL — ABNORMAL LOW (ref 150–400)
RBC: 4.57 MIL/uL (ref 4.22–5.81)
RDW: 15 % (ref 11.5–15.5)
WBC: 5.9 10*3/uL (ref 4.0–10.5)
nRBC: 0 % (ref 0.0–0.2)

## 2018-08-24 IMAGING — DX DG CHEST 1V PORT
1 series · 1 of 1 positions shown · non-contrast
Comparison: 08/09/2017 chest radiograph

CLINICAL DATA: 81 y/o  M; fever.  Recent fall to left side.

EXAM:
PORTABLE CHEST 1 VIEW

[chest ap]
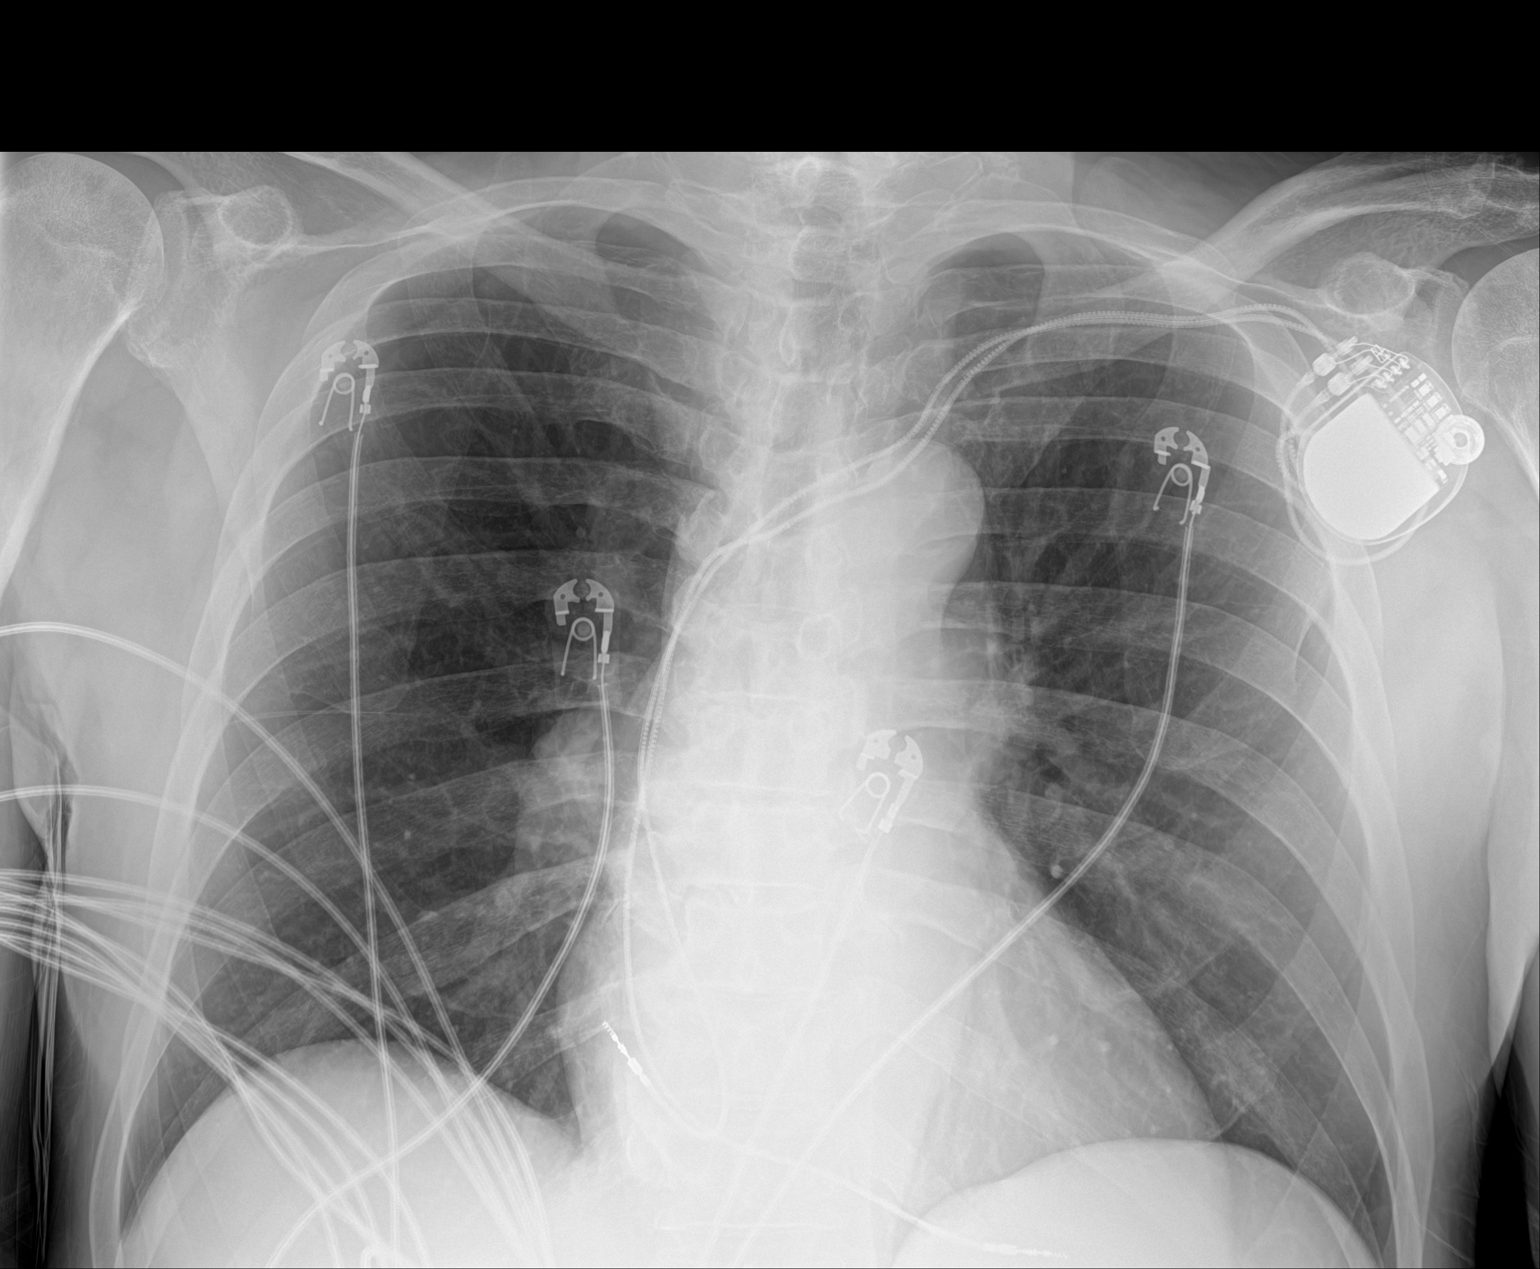

[1 of 1 positions shown; findings below may reference images not displayed]

FINDINGS: Stable heart size and mediastinal contours are within normal limits.
Two lead pacemaker. Both lungs are clear. The visualized skeletal
structures are unremarkable.
IMPRESSION: No active disease.

By: Norsida Taiba M.D.

## 2018-08-24 IMAGING — DX DG HIP (WITH OR WITHOUT PELVIS) 2-3V*L*
3 series · 3 of 3 positions shown · non-contrast
Comparison: None.

CLINICAL DATA: Pain following fall

EXAM:
DG HIP (WITH OR WITHOUT PELVIS) 2-3V LEFT

[pelvis ap]
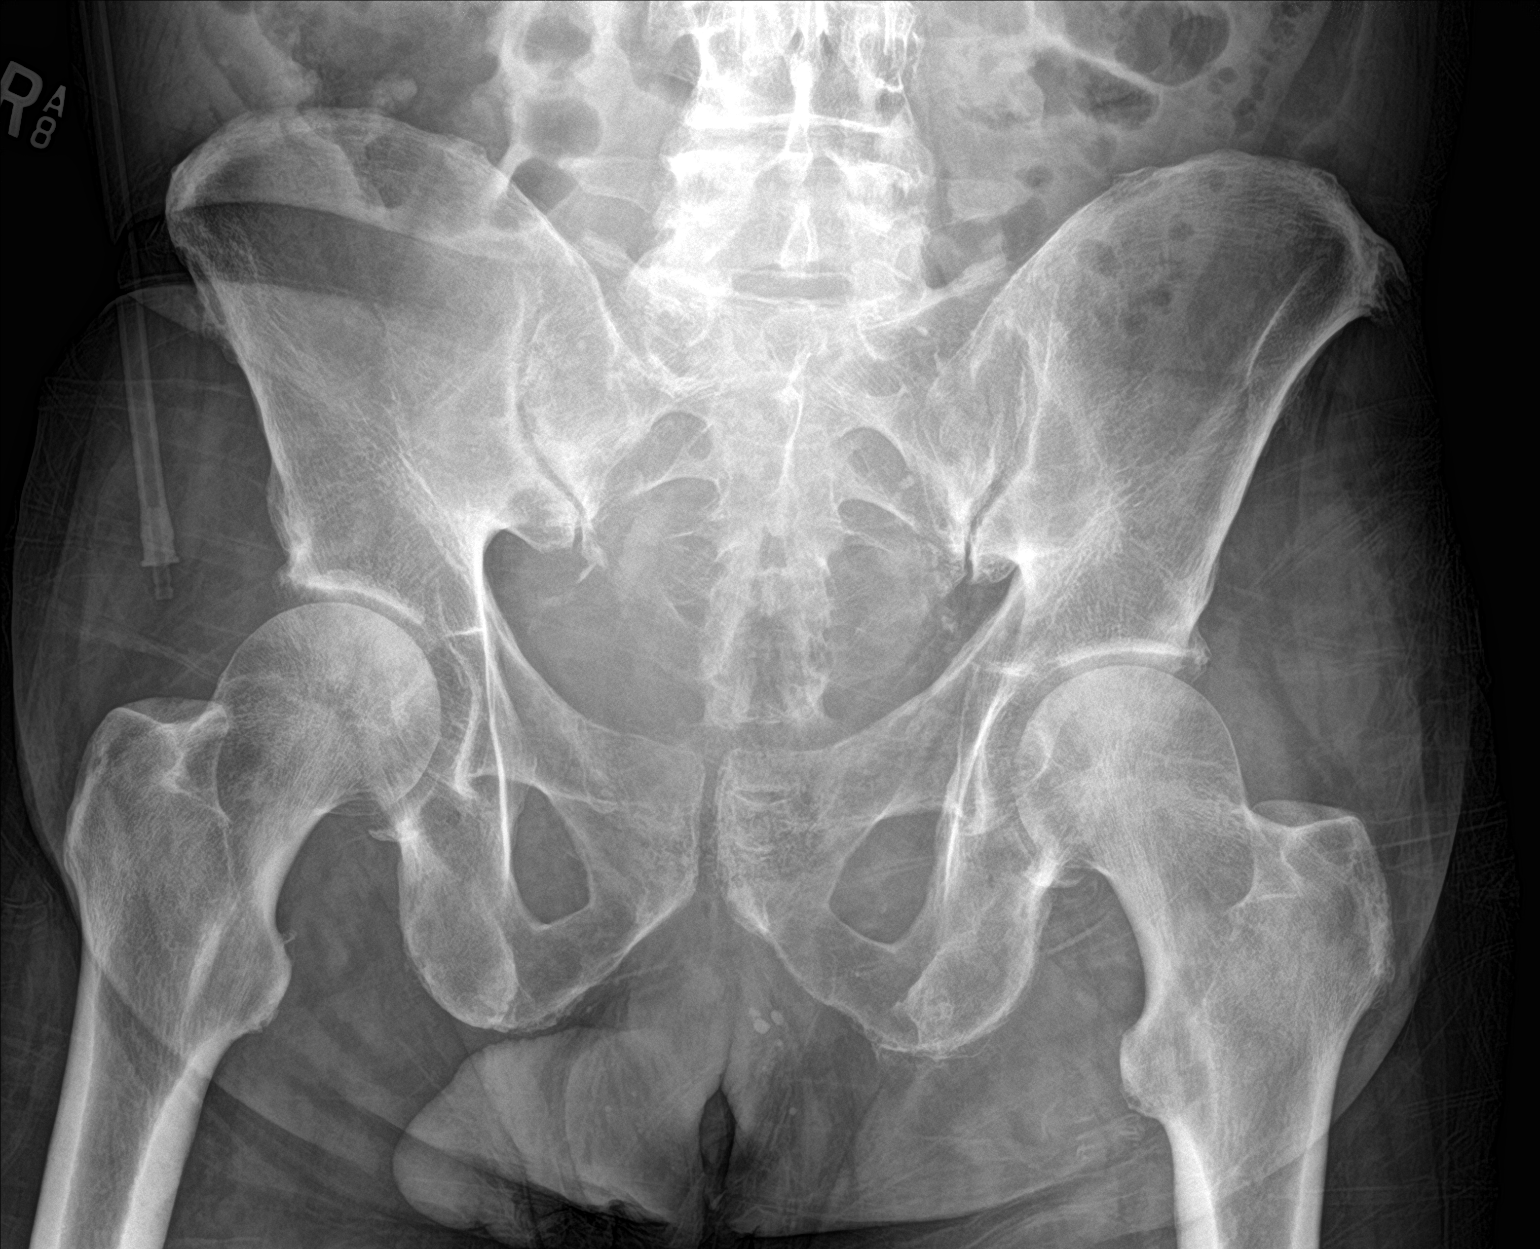

[hip ap]
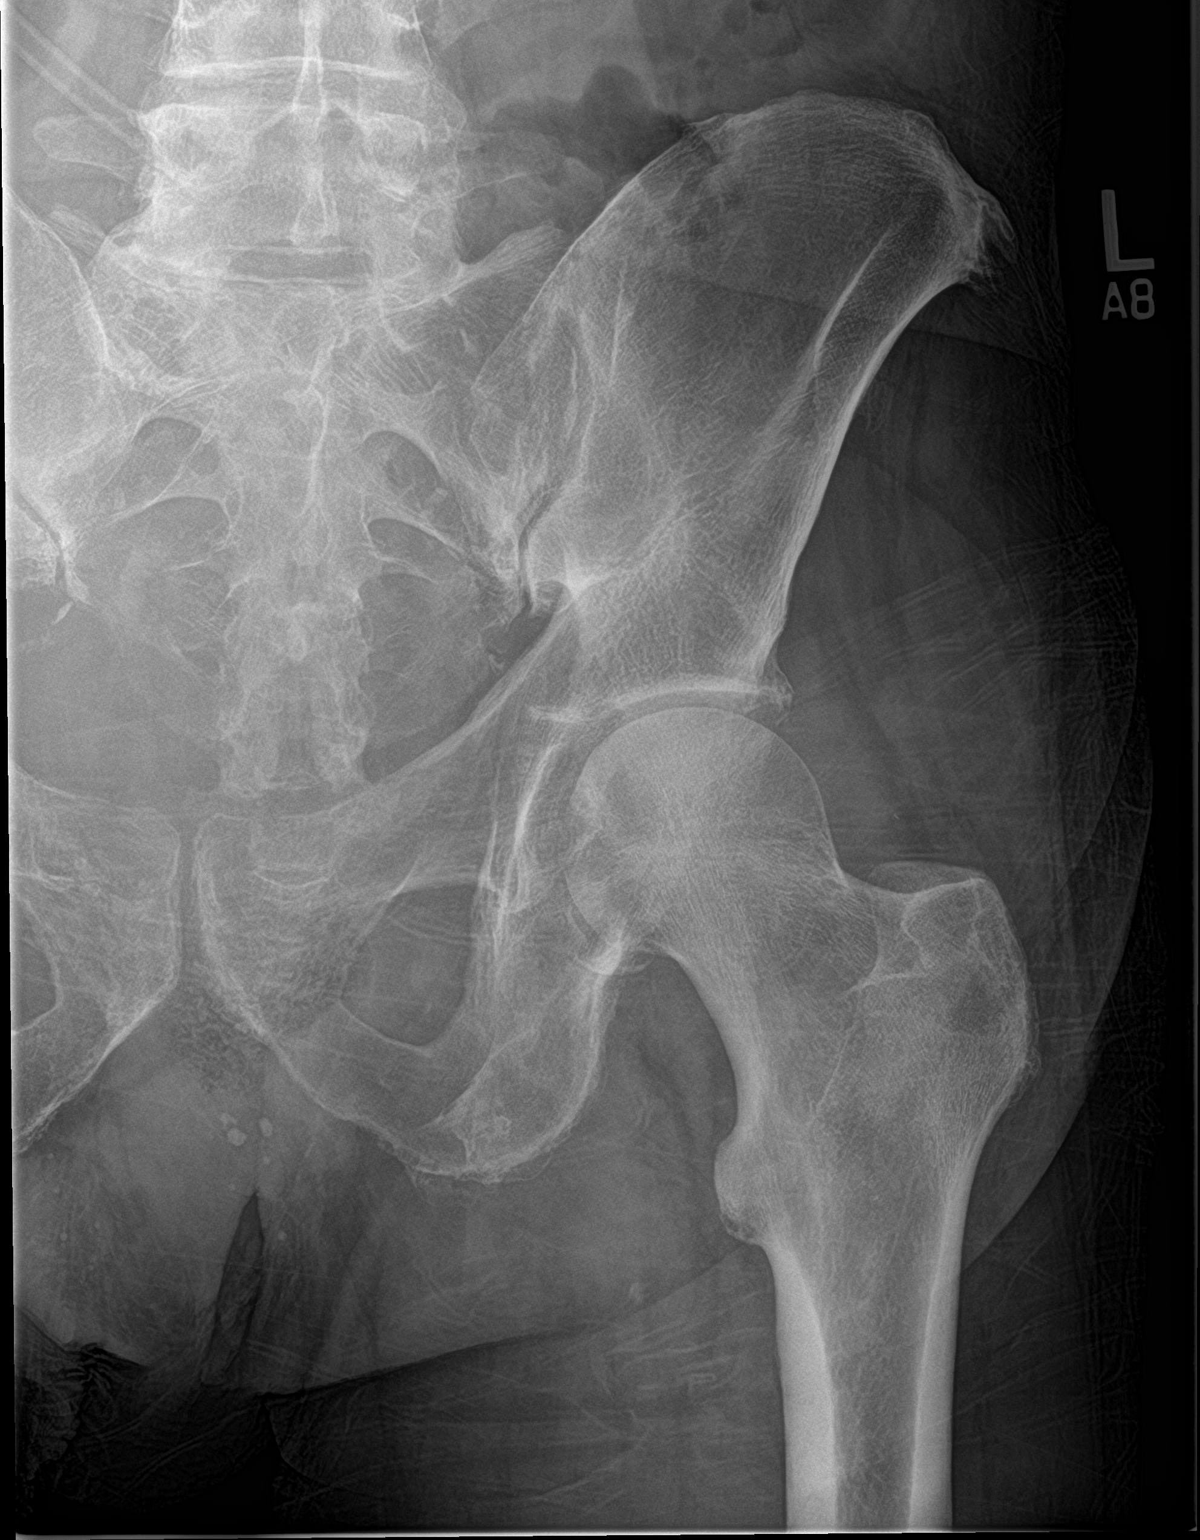

[hip lat]
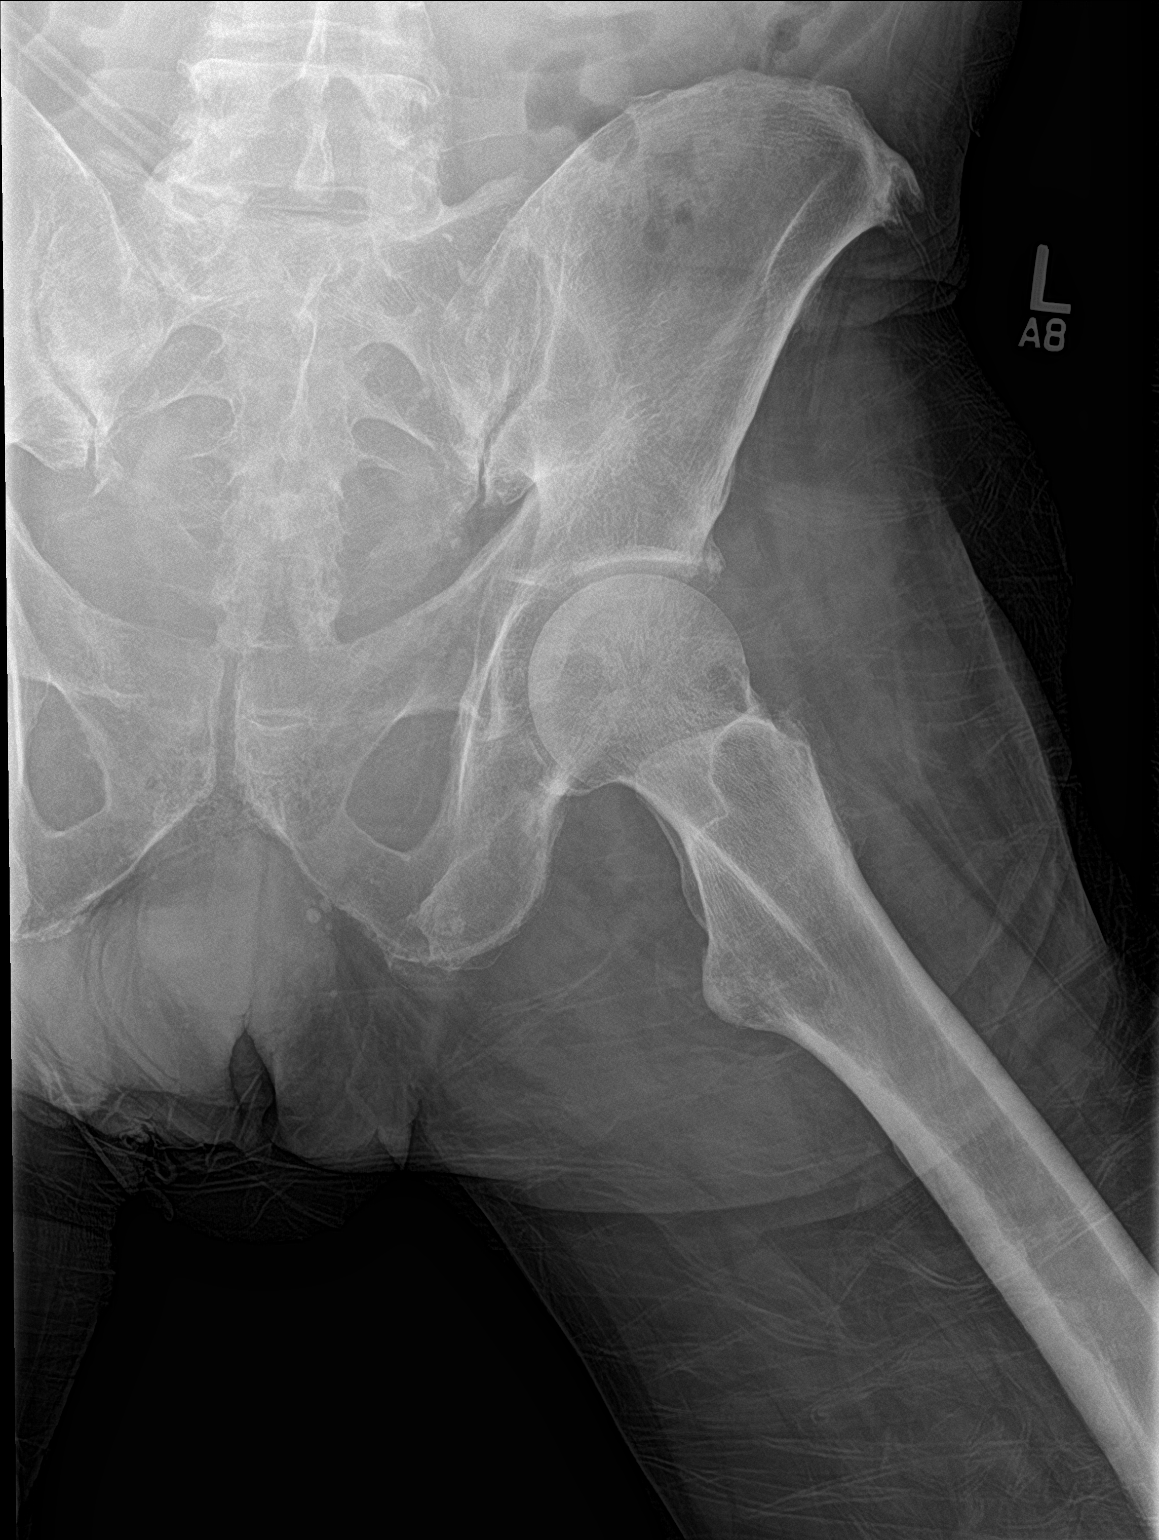

[3 of 3 positions shown; findings below may reference images not displayed]

FINDINGS: Frontal pelvis as well as frontal and lateral left hip images were
obtained. There is no appreciable fracture or dislocation. There is
mild symmetric narrowing of both hip joints. No erosive change.
IMPRESSION: Mild symmetric narrowing of both hip joints. No fracture or
dislocation.

## 2018-08-24 IMAGING — CT CT HEAD W/O CM
4 series · 16 of 47 positions shown, 18 images · non-contrast
Comparison: 08/08/2017

CLINICAL DATA: Fell in restroom at rehab, states he tripped over
his feet, fell onto LEFT side, LEFT frontal hematoma, history CHF,
hypertension, chronic inflammatory demyelinating polyneuropathy

EXAM:
CT HEAD WITHOUT CONTRAST
TECHNIQUE: Contiguous axial images were obtained from the base of the skull
through the vertex without intravenous contrast. Sagittal and
coronal MPR images reconstructed from axial data set.

[Series 3: head without · axial · non-contrast · 0.45mm/px · z∈[-63,+62]mm · 7 of 35 slices shown, 9 images]
[im 5/35  brain]
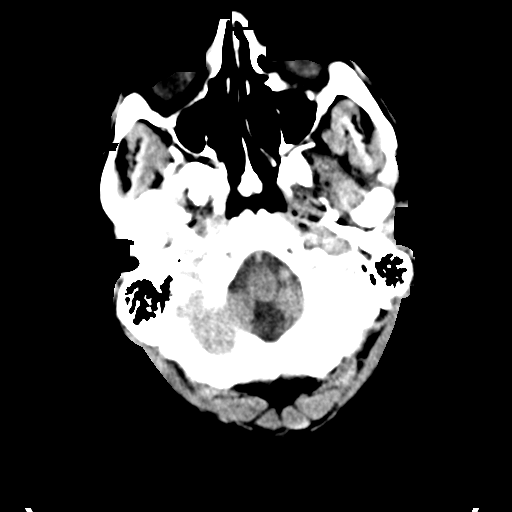
[im 5/35  bone]
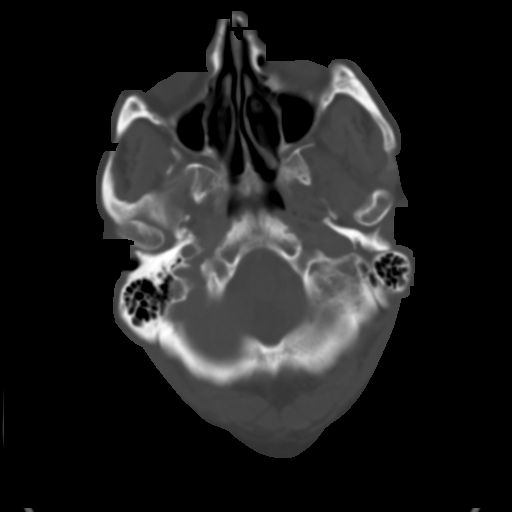
[im 9/35  brain]
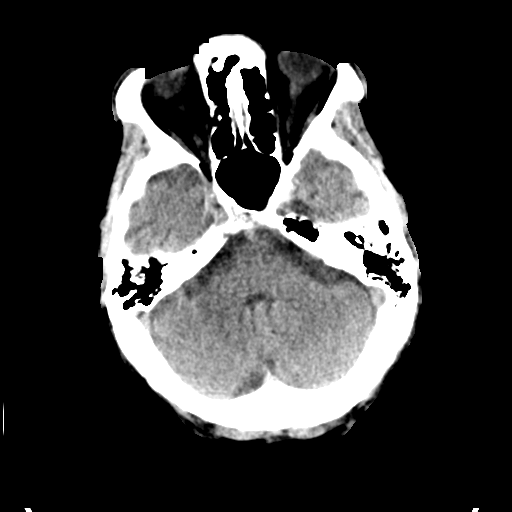
[im 13/35  brain]
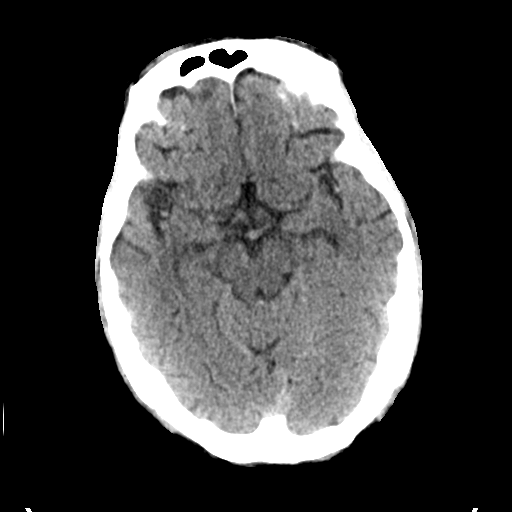
[im 18/35  brain]
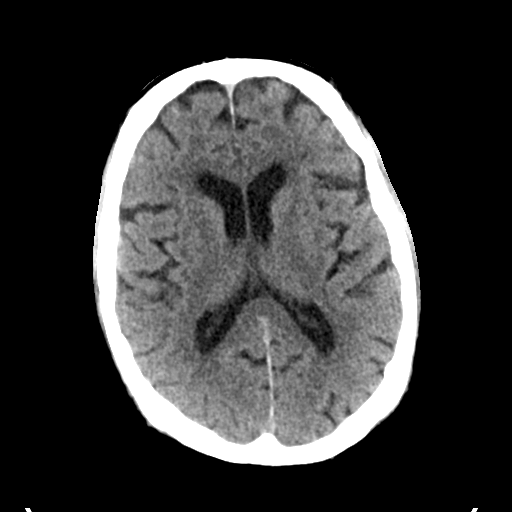
[im 22/35  brain]
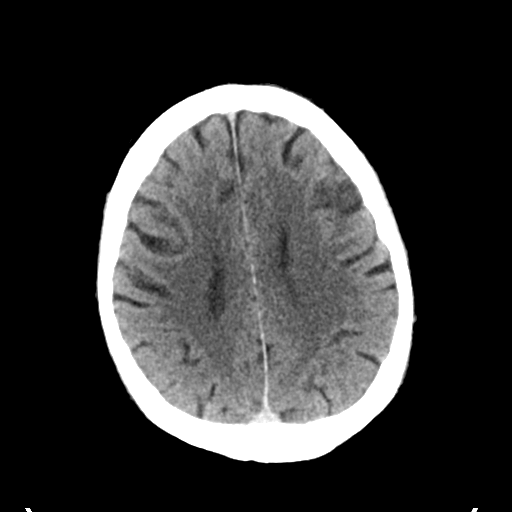
[im 22/35  bone]
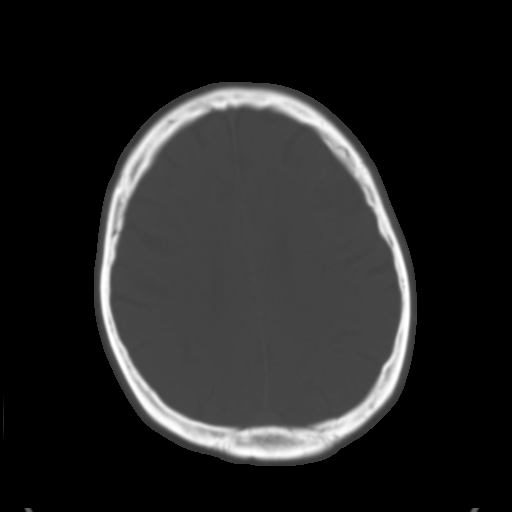
[im 26/35  brain]
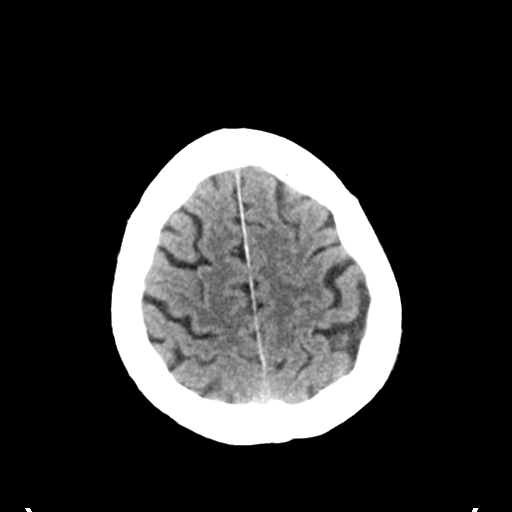
[im 30/35  brain]
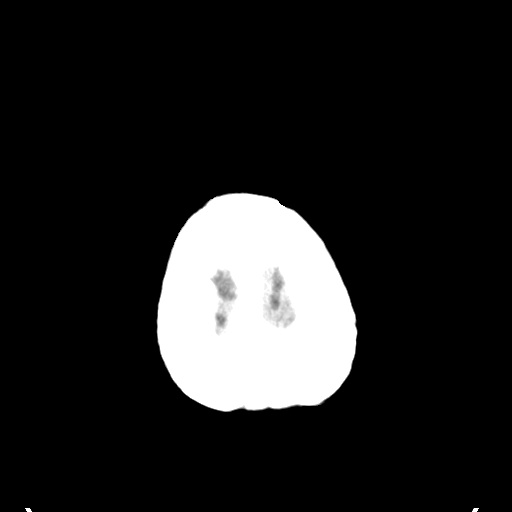

[Series 4: head bone · axial · 0.45mm/px · z∈[-67,-31]mm · 3 of 88 slices shown]
[im 9/88  bone]
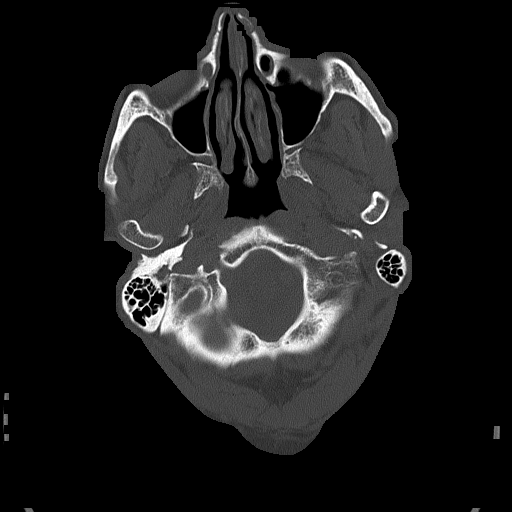
[im 18/88  bone]
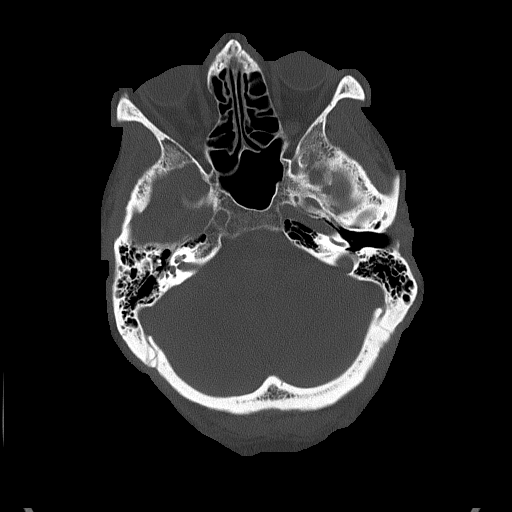
[im 27/88  bone]
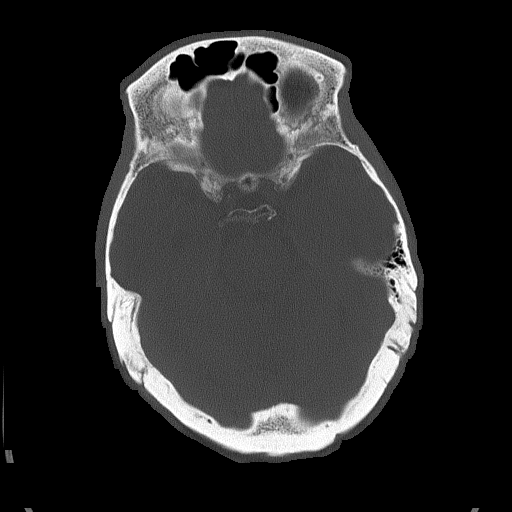

[Series 5: head without cor · coronal · non-contrast · 0.33mm/px · 3 of 73 slices shown]
[im 25/73  brain]
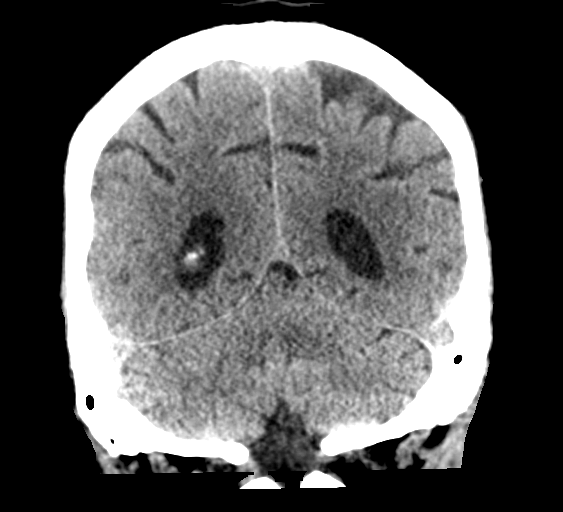
[im 33/73  brain]
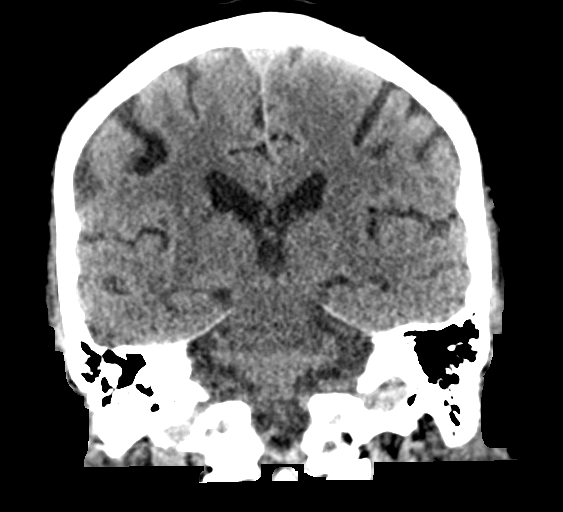
[im 41/73  brain]
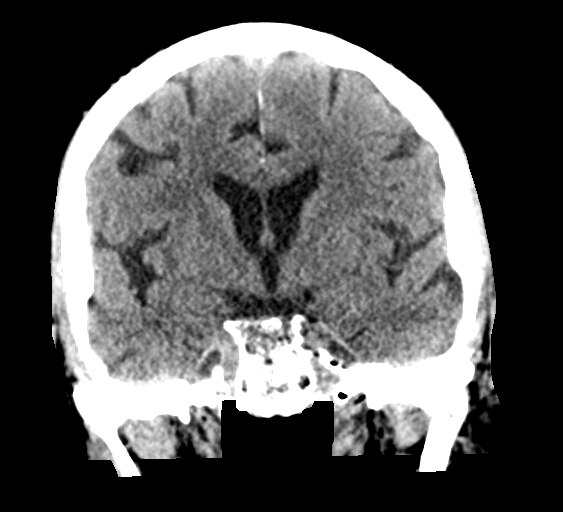

[Series 6: head without sag · sagittal · non-contrast · 0.34mm/px · 3 of 63 slices shown]
[im 21/63  brain]
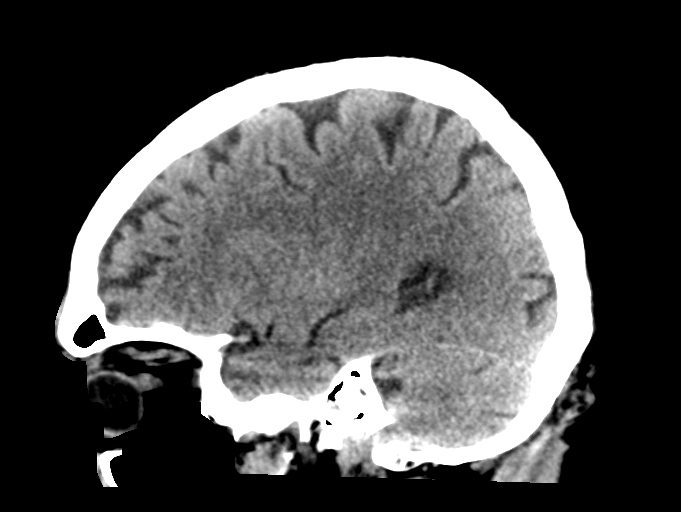
[im 32/63  brain]
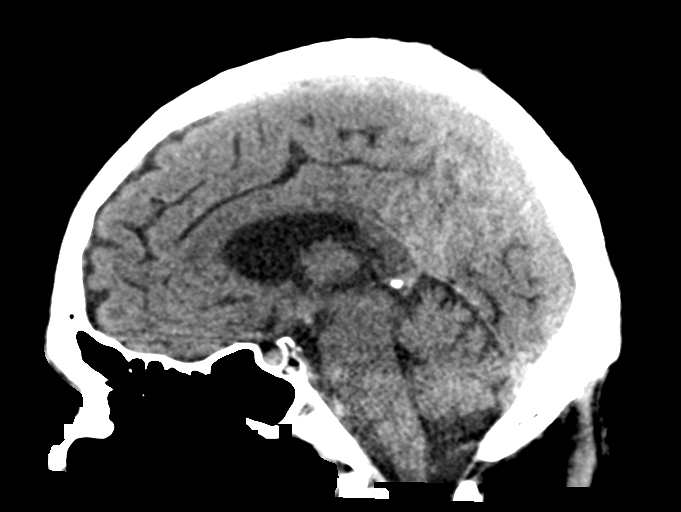
[im 42/63  brain]
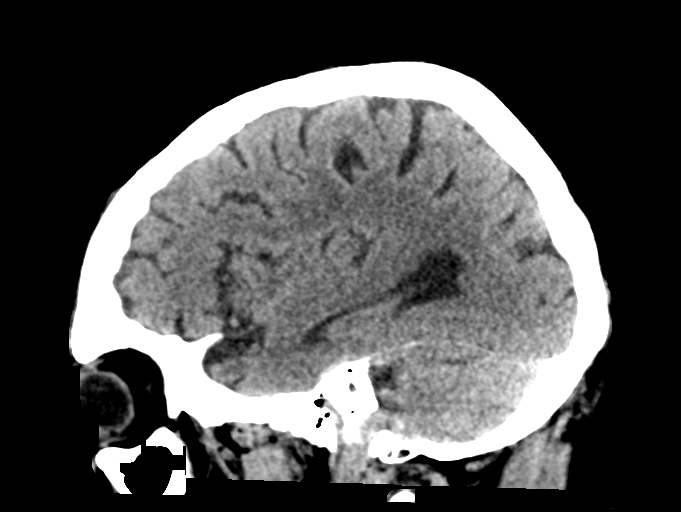

[16 of 47 positions shown; findings below may reference images not displayed]

FINDINGS: Brain: Normal ventricular morphology. No midline shift or mass
effect. Normal appearance of brain parenchyma. No intracranial
hemorrhage, mass lesion, or evidence of acute infarction. No
extra-axial fluid collections.

Vascular: No hyperdense vessels. Minimal atherosclerotic
calcification of internal carotid arteries at skull base

Skull: Calvaria intact.  Small LEFT frontal scalp hematoma.

Sinuses/Orbits: Paranasal sinuses and mastoid air cells clear.
Intraorbital soft tissue planes clear.

Other: N/A
IMPRESSION: No acute intracranial abnormalities.

Small LEFT frontal scalp hematoma.

## 2018-08-24 MED ORDER — DENOSUMAB 120 MG/1.7ML ~~LOC~~ SOLN
SUBCUTANEOUS | Status: AC
  Filled 2018-08-24: qty 1.7

## 2018-08-24 MED ORDER — ACETAMINOPHEN 325 MG PO TABS
ORAL_TABLET | ORAL | Status: AC
  Filled 2018-08-24: qty 2

## 2018-08-24 MED ORDER — FAMOTIDINE IN NACL 20-0.9 MG/50ML-% IV SOLN: 20.0000 mg | Freq: Once | INTRAVENOUS | Status: DC

## 2018-08-24 MED ORDER — DIPHENHYDRAMINE HCL 25 MG PO CAPS
ORAL_CAPSULE | ORAL | Status: AC
  Filled 2018-08-24: qty 2

## 2018-08-24 MED ORDER — SODIUM CHLORIDE 0.9 % IV SOLN
20.0000 mg | Freq: Once | INTRAVENOUS | Status: AC
  Administered 2018-08-24: 20 mg via INTRAVENOUS
  Filled 2018-08-24: qty 2

## 2018-08-24 MED ORDER — SODIUM CHLORIDE 0.9 % IV SOLN
Freq: Once | INTRAVENOUS | Status: AC
  Administered 2018-08-24: 10:00:00 via INTRAVENOUS
  Filled 2018-08-24: qty 250

## 2018-08-24 MED ORDER — ACETAMINOPHEN 325 MG PO TABS
650.0000 mg | ORAL_TABLET | Freq: Once | ORAL | Status: AC
  Administered 2018-08-24: 650 mg via ORAL

## 2018-08-24 MED ORDER — MONTELUKAST SODIUM 10 MG PO TABS
ORAL_TABLET | ORAL | Status: AC
  Filled 2018-08-24: qty 1

## 2018-08-24 MED ORDER — MONTELUKAST SODIUM 10 MG PO TABS
10.0000 mg | ORAL_TABLET | Freq: Once | ORAL | Status: AC
  Administered 2018-08-24: 10 mg via ORAL

## 2018-08-24 MED ORDER — DENOSUMAB 120 MG/1.7ML ~~LOC~~ SOLN
120.0000 mg | Freq: Once | SUBCUTANEOUS | Status: AC
  Administered 2018-08-24: 120 mg via SUBCUTANEOUS

## 2018-08-24 MED ORDER — DIPHENHYDRAMINE HCL 25 MG PO CAPS
50.0000 mg | ORAL_CAPSULE | Freq: Once | ORAL | Status: AC
  Administered 2018-08-24: 50 mg via ORAL

## 2018-08-24 MED ORDER — SODIUM CHLORIDE 0.9 % IV SOLN
16.2000 mg/kg | Freq: Once | INTRAVENOUS | Status: AC
  Administered 2018-08-24: 1400 mg via INTRAVENOUS
  Filled 2018-08-24: qty 60

## 2018-08-24 NOTE — Progress Notes (Signed)
Confirmed with previous encounter and infusion nurse. Delton See is due today as it was released from the treatment plan but not administered.

## 2018-08-24 NOTE — Patient Instructions (Signed)
[facility] Discharge Instructions for Patients Receiving Chemotherapy  Today you received the following chemotherapy agents :  daratumumab (Darzalex).  To help prevent nausea and vomiting after your treatment, we encourage you to take your nausea medication as prescribed.   If you develop nausea and vomiting that is not controlled by your nausea medication, call the clinic.   BELOW ARE SYMPTOMS THAT SHOULD BE REPORTED IMMEDIATELY:  *FEVER GREATER THAN 100.5 F  *CHILLS WITH OR WITHOUT FEVER  NAUSEA AND VOMITING THAT IS NOT CONTROLLED WITH YOUR NAUSEA MEDICATION  *UNUSUAL SHORTNESS OF BREATH  *UNUSUAL BRUISING OR BLEEDING  TENDERNESS IN MOUTH AND THROAT WITH OR WITHOUT PRESENCE OF ULCERS  *URINARY PROBLEMS  *BOWEL PROBLEMS  UNUSUAL RASH Items with * indicate a potential emergency and should be followed up as soon as possible.  Feel free to call the clinic should you have any questions or concerns. The clinic phone number is [phone].  Please show the CHEMO ALERT CARD at check-in to the Emergency Department and triage nurse.   

## 2018-08-25 LAB — MULTIPLE MYELOMA PANEL, SERUM
Albumin SerPl Elph-Mcnc: 3.2 g/dL (ref 2.9–4.4)
Albumin/Glob SerPl: 1.2 (ref 0.7–1.7)
Alpha 1: 0.3 g/dL (ref 0.0–0.4)
Alpha2 Glob SerPl Elph-Mcnc: 1 g/dL (ref 0.4–1.0)
B-Globulin SerPl Elph-Mcnc: 1 g/dL (ref 0.7–1.3)
Gamma Glob SerPl Elph-Mcnc: 0.5 g/dL (ref 0.4–1.8)
Globulin, Total: 2.8 g/dL (ref 2.2–3.9)
IgA: 156 mg/dL (ref 61–437)
IgG (Immunoglobin G), Serum: 639 mg/dL (ref 603–1613)
IgM (Immunoglobulin M), Srm: 39 mg/dL (ref 15–143)
M Protein SerPl Elph-Mcnc: 0.1 g/dL — ABNORMAL HIGH
Total Protein ELP: 6 g/dL (ref 6.0–8.5)

## 2018-08-31 NOTE — Progress Notes (Signed)
HEMATOLOGY/ONCOLOGY CLINIC NOTE  Date of Service: 09/07/2018  Patient Care Team: [name], MD as PCP - General (Family Medicine) [name], MD (Cardiology) [name], MD (Cardiology)  CHIEF COMPLAINTS/PURPOSE OF CONSULTATION:  Continue mx of myeloma  HISTORY OF PRESENTING ILLNESS:   Jonathon [name] is a wonderful 83 y.o. male who has been referred to [place] by Dr. [name] for evaluation and management of Lytic bone lesions. He is accompanied today by his daughter. The pt reports that he is doing well overall. He is currently a resident at [facility] in [place], [place].   The pt presented to the ED on 03/24/18 with a right distal clavicle fracture, which occurred after trying to get out of bed and pushing himself up with his right arm. He notes that his energy has been noticeably decreased for the last month. He also notes that his lower back has a slight sting when trying to stand up from sitting. He also notes some pain in his lower, right chest wall, which is exacerbated when coughing. The pt has been taking 25m Tramadol which is controlling his pain. He does endorse some weakening of both of his lower extremities in the past 1-2 months, which he attributes somewhat to his recent lower back pain.   The pt denies any bowel abnormalities recently. He notes that he stays very well hydrated, and urinates 4-5 times a night, and does have BPH, and takes Flomax. The pt denies any recent changes in his urination habits.   The pt notes that his appetite decreased for a few months, until he began assisted living. He has lost 9 pounds in the last week.   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 He now uses a walker to ambulate. He notes that the CIDP symptoms are primarily located to his legs. He sees Dr. [name] [facility]. He took steroids  without any symptomatic relief. IVIG was cost-prohibitive for the patient, but was recommended.    Regarding the patient's anemia in April 2019, he denies any obvious bleeding at that time, and denies surgeries.    The pt notes that his last colonoscopy, in 2015, was not concerning, which is corroborated by chart review.   The pt and daughter deny any concerns for memory issues, and the daughter adds his memory is "elephant-like". They are not sure why Namenda is on his medication list.   The pt denies any recent dental procedures, or concerns for dental issues. He has maintained every 6 month dental cleanings for all of his life.   Of note prior to the patient's visit today, pt has had a CT C/A/P completed on 03/31/18 with results revealing IMPRESSION: 1. Lytic lesion throughout all visualized vertebra from the lower cervical spine through the sacrum. Epidural extension of tumor T2, T3 and L2 level. Right L3-4 foraminal extension tumor. 2. Lytic lesions involving majority of ribs. Multiple pathologic rib fractures with bony expansion/sub pleural extension of tumor at several levels. 3. Lytic lesions of the scapula, hips bilaterally and pelvis bilaterally. With further progression of tumor, patient may be at risk for pathologic fractures. Pathologic fracture right clavicle incompletely assessed. 4. Circumferential narrowing sigmoid colon. Question possibility of underlying mass (series 2, image 97). Alternatively this could represent muscular hypertrophy/peristalsis. 5. Gastric antrum circumferential narrowing. Cannot exclude mass although this  may be related to peristalsis. 6. No primary lung malignancy noted. 7. Matted aortic pulmonary window lymph nodes. 8. Circumferential urinary bladder wall thickening greater anterior dome region. Right lateral bladder diverticulum. 9. 5 mm low-density lesion pancreatic body (series 5, image 10) unchanged. This is felt unlikely to be a primary malignancy. 10.  Right parapelvic 2.2 cm cyst without significant change. Scattered low-density renal lesions bila2terally too small to adequately characterize. Some were present previously. Statistically these are likely cysts. 11. Stable adrenal gland hyperplasia. 12. Gynecomastia. 13. Prostate gland causes slight impression upon the bladder base. 14.  Aortic Atherosclerosis. 15. Sequential pacemaker in place. 16. Gallstones.  Most recent lab results (03/25/18) of CBC w/diff and CMP is as follows: all values are WNL except for RBC at 4.14, HGB at 11.3, HCT at 33.6, Chloride at 94, CO2 at 33, Glucose at 100, BUN at 25, Total Protein at 9.4, Albumin at 3.4, Calcium at 13.8, GFR at 58. 03/25/18 PSA, Total was normal at 1.2  On review of systems, pt reports positional lower back pain, lower right chest wall pain, recently decreased energy levels, relatively weaker appetite, weight loss, right clavicle pain, weakening of the lower extremities, moving his bowels well, and denies changes in urination, abdominal pains, changes in breathing, changes in bowel habits, pain along the spine, leg swelling, and any other symptoms.  Interval History:   Jonathon [name] returns today for management, evaluation, and C5D15 treatment of his Multiple Myeloma. The patient's last visit with [place] was on 08/10/18. The pt reports that he is doing well overall.  The pt reports that he has not developed any new symptoms or concerns in the interim. He denies any bone pains and has been staying active with 30 minutes of bicycling indoors a day. The pt denies any new back pains at this time as well. The pt notes that his previous rib pain has resolved and he notes he is able to breathe easily and comfortably at this time.  Lab results today (09/07/18) of CBC w/diff and CMP is as follows: all values are WNL except for HGB at 12.9, Calcium at 8.6.  On review of systems, pt reports good energy levels, staying active, breathing well, eating well, good  appetite, and denies bone pains, new back pains, rib pain, abdominal pains, and any other symptoms.   MEDICAL HISTORY:  Past Medical History:  Diagnosis Date   Asthma    BENIGN PROSTATIC HYPERTROPHY, HX OF 10/25/2008   Bone metastases ([place]) 04/14/2018   BRADYCARDIA 2005   Chronic diastolic congestive heart failure ([place]) 06/05/2016   CIDP (chronic inflammatory demyelinating polyneuropathy) ([place])    HYPERTENSION 11/21/2006   Iron deficiency anemia, unspecified 04/19/2013   Multiple myeloma ([place]) 04/29/2018   NEPHROLITHIASIS, HX OF 11/21/2006 and 10/15/2008   PACEMAKER, PERMANENT 2005   Gen change 2014 Medtronic Adaptic L dual-chamber pacemaker, serial #[id number]    SVT (supraventricular tachycardia) ([place])    TOBACCO ABUSE 10/25/2008   Quit 2012    SURGICAL HISTORY: Past Surgical History:  Procedure Laterality Date   COLONOSCOPY  2004,2009   PACEMAKER INSERTION  2005   PERMANENT PACEMAKER GENERATOR CHANGE N/A 06/19/2012   Procedure: PERMANENT PACEMAKER GENERATOR CHANGE;  Surgeon: [name], MD; Medtronic Adaptic L dual-chamber pacemaker, serial #[id number]     SKIN GRAFT Right 1962   wrist    SOCIAL HISTORY: Social History   Socioeconomic History   Marital status: Divorced    Spouse name: Not on file   Number of  children: 4   Years of education: Not on file   Highest education level: Not on file  Occupational History   Occupation: ENVIROMENTAL SERVICE    Employer: [employer]   Occupation: retired  Scientist, product/process development strain: Not on file   Food insecurity:    Worry: Not on file    Inability: Not on file   Transportation needs:    Medical: Not on file    Non-medical: Not on file  Tobacco Use   Smoking status: Former Smoker    Packs/day: 0.25    Years: 46.00    Pack years: 11.50    Types: Cigarettes    Start date: 03/16/1965    Last attempt to quit: 06/15/2010    Years since quitting: 8.2   Smokeless tobacco:  Never Used   Tobacco comment: quit 3 years ago  Substance and Sexual Activity   Alcohol use: No    Alcohol/week: 0.0 standard drinks   Drug use: No   Sexual activity: Not on file  Lifestyle   Physical activity:    Days per week: Not on file    Minutes per session: Not on file   Stress: Not on file  Relationships   Social connections:    Talks on phone: Not on file    Gets together: Not on file    Attends religious service: Not on file    Active member of club or organization: Not on file    Attends meetings of clubs or organizations: Not on file    Relationship status: Not on file   Intimate partner violence:    Fear of current or ex partner: Not on file    Emotionally abused: Not on file    Physically abused: Not on file    Forced sexual activity: Not on file  Other Topics Concern   Not on file  Social History Narrative   Has relocated from [place] in 2007. 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    FAMILY HISTORY: Family History  Problem Relation Age of Onset   Heart attack Father        Died, 63   Kidney disease Father    Parkinson's disease Mother        Died, 44   Breast cancer Sister        Living, 25   Diabetes type II Brother        Living, 41   Breast cancer Sister        Living, 73   Diabetes Daughter        Living, 52   Diabetes Son        Living, 30   Ovarian cancer Daughter    Colon cancer Neg Hx    Rectal cancer Neg Hx    Stomach cancer Neg Hx     ALLERGIES:  is allergic to lexapro [escitalopram oxalate].  MEDICATIONS:  Current Outpatient Medications  Medication Sig Dispense Refill   acyclovir (ZOVIRAX) 400 MG tablet Take 1 tablet (400 mg total) by mouth 2 (two) times daily. 60 tablet 11   aspirin EC 81 MG tablet Take 81 mg by mouth daily.     dexamethasone (DECADRON) 4 MG tablet 12m (5 tabs) with breakfast the day after each Daratumumab infusion 20 tablet 4   flecainide (TAMBOCOR) 50 MG tablet Take 75  mg by mouth daily as needed.     furosemide (LASIX) 40 MG tablet Take 40 mg by mouth  daily.     irbesartan (AVAPRO) 300 MG tablet Take 300 mg by mouth daily.     Melatonin 3 MG CAPS Take 3 mg by mouth at bedtime.     memantine (NAMENDA) 5 MG tablet Take 5 mg by mouth 2 (two) times daily.     metoprolol tartrate (LOPRESSOR) 25 MG tablet Take 1 tablet (25 mg total) by mouth 2 (two) times daily.     ondansetron (ZOFRAN) 8 MG tablet Take 1 tablet (8 mg total) by mouth 2 (two) times daily as needed (Nausea or vomiting). 30 tablet 1   oseltamivir (TAMIFLU) 75 MG capsule Take 1 capsule (75 mg total) by mouth 2 (two) times daily. 9 capsule 0   polyethylene glycol (MIRALAX / GLYCOLAX) packet Take 17 g by mouth daily. 14 each 0   prochlorperazine (COMPAZINE) 10 MG tablet Take 1 tablet (10 mg total) by mouth every 6 (six) hours as needed (Nausea or vomiting). 30 tablet 1   Saccharomyces boulardii (FLORASTOR PO) Take 250 mg by mouth 2 (two) times daily.     tamsulosin (FLOMAX) 0.4 MG CAPS capsule Take 2 capsules (0.8 mg total) by mouth daily. 90 capsule 0   traMADol (ULTRAM) 50 MG tablet Take 1 tablet( 50 mg total) by mouth every 8 hrs as needed for pain 90 tablet 2   vitamin B-12 (CYANOCOBALAMIN) 500 MCG tablet Take 1,000 mcg by mouth daily.     Vitamin D, Ergocalciferol, (DRISDOL) 50000 units CAPS capsule Take 50,000 Units by mouth every 7 (seven) days.     No current facility-administered medications for this visit.     REVIEW OF SYSTEMS:    A 10+ POINT REVIEW OF SYSTEMS WAS OBTAINED including neurology, dermatology, psychiatry, cardiac, respiratory, lymph, extremities, GI, GU, Musculoskeletal, constitutional, breasts, reproductive, HEENT.  All pertinent positives are noted in the HPI.  All others are negative.   PHYSICAL EXAMINATION: ECOG PERFORMANCE STATUS: 2 - Symptomatic, <50% confined to bed  VS stable GENERAL:alert, in no acute distress and comfortable SKIN: no acute rashes, no  significant lesions EYES: conjunctiva are pink and non-injected, sclera anicteric OROPHARYNX: MMM, no exudates, no oropharyngeal erythema or ulceration NECK: supple, no JVD LYMPH:  no palpable lymphadenopathy in the cervical, axillary or inguinal regions LUNGS: clear to auscultation b/l with normal respiratory effort HEART: regular rate & rhythm ABDOMEN:  normoactive bowel sounds , non tender, not distended. No palpable hepatosplenomegaly.  Extremity: no pedal edema PSYCH: alert & oriented x 3 with fluent speech NEURO: no focal motor/sensory deficits   LABORATORY DATA:  I have reviewed the data as listed  . CBC Latest Ref Rng & Units 09/07/2018 08/24/2018 08/10/2018  WBC 4.0 - 10.5 K/uL 6.3 5.9 6.3  Hemoglobin 13.0 - 17.0 g/dL 12.9(L) 12.4(L) 12.9(L)  Hematocrit 39.0 - 52.0 % 41.3 39.0 40.4  Platelets 150 - 400 K/uL 162 124(L) 163    . CMP Latest Ref Rng & Units 09/07/2018 08/24/2018 08/10/2018  Glucose 70 - 99 mg/dL 92 103(H) 93  BUN 8 - 23 mg/dL _0 Creatinine 0.61 - 1.24 mg/dL 1.03 1.09 1.06  Sodium 135 - 145 mmol/L 140 139 141  Potassium 3.5 - 5.1 mmol/L 4.2 4.2 4.2  Chloride 98 - 111 mmol/L 106 107 107  CO2 22 - 32 mmol/L 24 21(L) 24  Calcium 8.9 - 10.3 mg/dL 8.6(L) 8.8(L) 8.7(L)  Total Protein 6.5 - 8.1 g/dL 6.7 6.4(L) 6.5  Total Bilirubin 0.3 - 1.2 mg/dL 0.4 0.5 0.5  Alkaline Phos 38 -  126 U/L 86 84 93  AST 15 - 41 U/L _0 ALT 0 - 44 U/L _1 04/17/18 BM Bx:    RADIOGRAPHIC STUDIES: I have personally reviewed the radiological images as listed and agreed with the findings in the report. No results found.  ASSESSMENT & PLAN:  83 y.o. male with  1. Lytic bone lesions - w/u consistent with newly diagnosed multiple myeloma Labs upon initial presentation from 04/14/18; hypercalcemic with Calcium at 13.8, renal function up form baseline with Creatinine at 1.48, HGB at 11.3. PSA was normal.   03/31/18 CT C/A/P revealed 1. Lytic lesion throughout all  visualized vertebra from the lower cervical spine through the sacrum. Epidural extension of tumor T2, T3 and L2 level. Right L3-4 foraminal extension tumor. 2. Lytic lesions involving majority of ribs. Multiple pathologic rib fractures with bony expansion/sub pleural extension of tumor at several levels. 3. Lytic lesions of the scapula, hips bilaterally and pelvis bilaterally. With further progression of tumor, patient may be at risk for pathologic fractures. Pathologic fracture right clavicle incompletely assessed. 4. Circumferential narrowing sigmoid colon. Question possibility of underlying mass (series 2, image 97). Alternatively this could represent muscular hypertrophy/peristalsis. 5. Gastric antrum circumferential narrowing. Cannot exclude mass although this may be related to peristalsis. 6. No primary lung malignancy noted. 7. Matted aortic pulmonary window lymph nodes. 8. Circumferential urinary bladder wall thickening greater anterior dome region. Right lateral bladder diverticulum. 9. 5 mm low-density lesion pancreatic body (series 5, image 10) unchanged. This is felt unlikely to be a primary malignancy. 10. Right parapelvic 2.2 cm cyst without significant change. Scattered low-density renal lesions bilaterally too small to adequately characterize. Some were present previously. Statistically these are likely cysts. 11. Stable adrenal gland hyperplasia. 12. Gynecomastia. 13. Prostate gland causes slight impression upon the bladder base. 14.  Aortic Atherosclerosis. 15. Sequential pacemaker in place. 16. Gallstones.   04/23/18 PET/CT revealed Innumerable hypermetabolic lytic lesions throughout the skeleton especially involving the spine, ribs, bony pelvis along with some involvement of both proximal femurs. The appearance is compatible with pathologic diagnosis of plasma cell neoplasm could well reflect multiple myeloma. Resulting pathologic fractures of the right lateral clavicle and of multiple  bilateral ribs. Lesions are present along the cortical margins of the spinal canal. 2. Both the area and the stomach in the area in the sigmoid colon drawn attention to on prior CT scan appear benign normal today. 3. The confluence of AP window lymph nodes measures about 1.2 cm in short axis with maximum SUV 3.1, which is mildly above the blood pool merit surveillance. 4. Other imaging findings of potential clinical significance: Aortic Atherosclerosis. Coronary atherosclerosis. Pacemaker noted. Borderline prostatomegaly. Cholelithiasis.  04/21/18 BM Bx revealed Normocellular bone marrow with Plasma Cell neoplasm. Scattered medium sized clusters of kappa-restricted plasma cells (14% aspirate, 10% CD138 immunohistochemistry.   I do suspect that the burden of disease is underestimated in the bone marrow sample given PET/CT findings of innumerable lytic lesions and an M spike of 3.4g   06/08/18 DG Hips bilat which revealed Innumerable lucencies are noted throughout the pelvis and proximal femurs, similar findings noted on prior PET-CT of 04/23/2018. Findings are consistent patient's known multiple myeloma. Femoral necks are intact. 2.  Aortoiliac atherosclerotic vascular disease.  2. Pathologic right clavicle fracture due to myeloma 3. Hypercalcemia 03/25/18 CMP revealed Calcium at 13.8-- now down to 11.1 Will begin Xgeva injections, and strongly recommended keeping very well hydrated   PLAN:  -Discussed  pt labwork today, 09/07/18; HGB normalized to 12.9, other blood counts and chemistries are stable -Discussed the last available MMP from 08/24/18 which revealed an M spike of 0.1g, consistent with a very good partial response, with an initial M Protein of 3.4g. -Discussed that due to the low IgG Kappa M spike, pt could now be in remission, as his M Protein has IgG kappa specificity, which is the same as the Daratumumab antibody. -The pt has no prohibitive toxicities from continuing C5D15 Daratumumab and  Dexamethasone at this time. -No strong indication to add on additional medication like Revlimid at this time -Discussed the option to pursue transplant consideration vs maintenance therapy after completing induction. The pt notes that he is very comfortable with not pursuing transplant, and is appreciating the current preservation of his quality of life without intrusive therapies. -Will recheck Vitamin D levels and will adjust replacement as indicated -CIDP has been considered in the treatment choice -Continue Xgeva every 4 weeks -Again advised that the pt limit his physical stressors, and limit bending, pushing, pulling and stairs to limit risk of further pathologic bone fractures -Recommended that the pt continue to eat well, drink at least 48-64 oz of water each day, and walk 20-30 minutes each day.  -Will see the pt back in 4 weeks   -continue f/u and treatment as scheduled through end of may. Continue Marchelle Folks   All of the patients questions were answered with apparent satisfaction. The patient knows to call the clinic with any problems, questions or concerns.  The total time spent in the appt was 25 minutes and more than 50% was on counseling and direct patient cares.    Sullivan Lone MD MS AAHIVMS [facility] Hematology/Oncology Physician [facility]  (Office):       [phone] (Work cell):  [phone] (Fax):           [phone]  09/07/2018 10:15 AM  I, Baldwin Jamaica, am acting as a scribe for Dr. Sullivan Lone.   .I have reviewed the above documentation for accuracy and completeness, and I agree with the above. Brunetta Genera MD

## 2018-09-07 ENCOUNTER — Other Ambulatory Visit: Payer: Self-pay

## 2018-09-07 ENCOUNTER — Inpatient Hospital Stay (HOSPITAL_BASED_OUTPATIENT_CLINIC_OR_DEPARTMENT_OTHER): Payer: Medicare Other | Admitting: Hematology

## 2018-09-07 ENCOUNTER — Inpatient Hospital Stay: Payer: Medicare Other

## 2018-09-07 ENCOUNTER — Telehealth: Payer: Self-pay | Admitting: Hematology

## 2018-09-07 VITALS — BP 144/88 | HR 64 | Temp 97.7°F | Resp 17

## 2018-09-07 DIAGNOSIS — C9000 Multiple myeloma not having achieved remission: Secondary | ICD-10-CM

## 2018-09-07 DIAGNOSIS — Z803 Family history of malignant neoplasm of breast: Secondary | ICD-10-CM

## 2018-09-07 DIAGNOSIS — Z79899 Other long term (current) drug therapy: Secondary | ICD-10-CM

## 2018-09-07 DIAGNOSIS — I10 Essential (primary) hypertension: Secondary | ICD-10-CM

## 2018-09-07 DIAGNOSIS — Z5112 Encounter for antineoplastic immunotherapy: Secondary | ICD-10-CM

## 2018-09-07 DIAGNOSIS — Z7982 Long term (current) use of aspirin: Secondary | ICD-10-CM

## 2018-09-07 DIAGNOSIS — Z87891 Personal history of nicotine dependence: Secondary | ICD-10-CM

## 2018-09-07 DIAGNOSIS — Z7189 Other specified counseling: Secondary | ICD-10-CM

## 2018-09-07 DIAGNOSIS — C7951 Secondary malignant neoplasm of bone: Secondary | ICD-10-CM

## 2018-09-07 LAB — CMP (CANCER CENTER ONLY)
ALT: 12 U/L (ref 0–44)
AST: 16 U/L (ref 15–41)
Albumin: 3.5 g/dL (ref 3.5–5.0)
Alkaline Phosphatase: 86 U/L (ref 38–126)
Anion gap: 10 (ref 5–15)
BUN: 11 mg/dL (ref 8–23)
CO2: 24 mmol/L (ref 22–32)
Calcium: 8.6 mg/dL — ABNORMAL LOW (ref 8.9–10.3)
Chloride: 106 mmol/L (ref 98–111)
Creatinine: 1.03 mg/dL (ref 0.61–1.24)
GFR, Est AFR Am: >60 mL/min (ref >60)
GFR, Estimated: >60 mL/min (ref >60)
Glucose, Bld: 92 mg/dL (ref 70–99)
Potassium: 4.2 mmol/L (ref 3.5–5.1)
Sodium: 140 mmol/L (ref 135–145)
Total Bilirubin: 0.4 mg/dL (ref 0.3–1.2)
Total Protein: 6.7 g/dL (ref 6.5–8.1)

## 2018-09-07 LAB — CBC WITH DIFFERENTIAL/PLATELET
Abs Immature Granulocytes: 0.02 10*3/uL (ref 0.00–0.07)
Basophils Absolute: 0.1 10*3/uL (ref 0.0–0.1)
Basophils Relative: 1 %
Eosinophils Absolute: 0.5 10*3/uL (ref 0.0–0.5)
Eosinophils Relative: 8 %
HCT: 41.3 % (ref 39.0–52.0)
Hemoglobin: 12.9 g/dL — ABNORMAL LOW (ref 13.0–17.0)
Immature Granulocytes: 0 %
Lymphocytes Relative: 47 %
Lymphs Abs: 3 10*3/uL (ref 0.7–4.0)
MCH: 27 pg (ref 26.0–34.0)
MCHC: 31.2 g/dL (ref 30.0–36.0)
MCV: 86.6 fL (ref 80.0–100.0)
Monocytes Absolute: 0.6 10*3/uL (ref 0.1–1.0)
Monocytes Relative: 10 %
Neutro Abs: 2.1 10*3/uL (ref 1.7–7.7)
Neutrophils Relative %: 34 %
Platelets: 162 10*3/uL (ref 150–400)
RBC: 4.77 MIL/uL (ref 4.22–5.81)
RDW: 14.6 % (ref 11.5–15.5)
WBC: 6.3 10*3/uL (ref 4.0–10.5)
nRBC: 0 % (ref 0.0–0.2)

## 2018-09-07 MED ORDER — FAMOTIDINE IN NACL 20-0.9 MG/50ML-% IV SOLN
20.0000 mg | Freq: Once | INTRAVENOUS | Status: AC
  Administered 2018-09-07: 20 mg via INTRAVENOUS

## 2018-09-07 MED ORDER — SODIUM CHLORIDE 0.9 % IV SOLN: 20.0000 mg | Freq: Once | INTRAVENOUS | Status: DC

## 2018-09-07 MED ORDER — ACETAMINOPHEN 325 MG PO TABS
ORAL_TABLET | ORAL | Status: AC
  Filled 2018-09-07: qty 2

## 2018-09-07 MED ORDER — DENOSUMAB 120 MG/1.7ML ~~LOC~~ SOLN: 120.0000 mg | Freq: Once | SUBCUTANEOUS | Status: DC

## 2018-09-07 MED ORDER — MONTELUKAST SODIUM 10 MG PO TABS
10.0000 mg | ORAL_TABLET | Freq: Once | ORAL | Status: AC
  Administered 2018-09-07: 11:00:00 10 mg via ORAL

## 2018-09-07 MED ORDER — MONTELUKAST SODIUM 10 MG PO TABS
ORAL_TABLET | ORAL | Status: AC
  Filled 2018-09-07: qty 1

## 2018-09-07 MED ORDER — ACETAMINOPHEN 325 MG PO TABS
650.0000 mg | ORAL_TABLET | Freq: Once | ORAL | Status: AC
  Administered 2018-09-07: 650 mg via ORAL

## 2018-09-07 MED ORDER — FAMOTIDINE IN NACL 20-0.9 MG/50ML-% IV SOLN
INTRAVENOUS | Status: AC
  Filled 2018-09-07: qty 50

## 2018-09-07 MED ORDER — SODIUM CHLORIDE 0.9 % IV SOLN
16.2000 mg/kg | Freq: Once | INTRAVENOUS | Status: AC
  Administered 2018-09-07: 1400 mg via INTRAVENOUS
  Filled 2018-09-07: qty 60

## 2018-09-07 MED ORDER — SODIUM CHLORIDE 0.9 % IV SOLN
20.0000 mg | Freq: Once | INTRAVENOUS | Status: AC
  Administered 2018-09-07: 20 mg via INTRAVENOUS
  Filled 2018-09-07: qty 20

## 2018-09-07 MED ORDER — DIPHENHYDRAMINE HCL 25 MG PO CAPS
ORAL_CAPSULE | ORAL | Status: AC
  Filled 2018-09-07: qty 2

## 2018-09-07 MED ORDER — DIPHENHYDRAMINE HCL 25 MG PO CAPS
50.0000 mg | ORAL_CAPSULE | Freq: Once | ORAL | Status: AC
  Administered 2018-09-07: 50 mg via ORAL

## 2018-09-07 MED ORDER — SODIUM CHLORIDE 0.9 % IV SOLN
Freq: Once | INTRAVENOUS | Status: AC
  Administered 2018-09-07: 10:00:00 via INTRAVENOUS
  Filled 2018-09-07: qty 250

## 2018-09-07 NOTE — Progress Notes (Signed)
Xgeva dose was given 08/24/18 per Ronaldo Miyamoto, RN. Today's dose d/c'd and inf RN updated not to administer dose today. Kennith Center, Pharm.D., CPP 09/07/2018@11 :08 AM

## 2018-09-07 NOTE — Progress Notes (Signed)
Delton See will be given today. See inf note 08/24/18. RN aware to release Xgeva order today. Kennith Center, Pharm.D., CPP 09/07/2018@10 :42 AM

## 2018-09-07 NOTE — Patient Instructions (Signed)
[facility] Discharge Instructions for Patients Receiving Chemotherapy  Today you received the following chemotherapy agents:  Darzalex  To help prevent nausea and vomiting after your treatment, we encourage you to take your nausea medication as prescribed.   If you develop nausea and vomiting that is not controlled by your nausea medication, call the clinic.   BELOW ARE SYMPTOMS THAT SHOULD BE REPORTED IMMEDIATELY:  *FEVER GREATER THAN 100.5 F  *CHILLS WITH OR WITHOUT FEVER  NAUSEA AND VOMITING THAT IS NOT CONTROLLED WITH YOUR NAUSEA MEDICATION  *UNUSUAL SHORTNESS OF BREATH  *UNUSUAL BRUISING OR BLEEDING  TENDERNESS IN MOUTH AND THROAT WITH OR WITHOUT PRESENCE OF ULCERS  *URINARY PROBLEMS  *BOWEL PROBLEMS  UNUSUAL RASH Items with * indicate a potential emergency and should be followed up as soon as possible.  Feel free to call the clinic should you have any questions or concerns. The clinic phone number is [phone].  Please show the CHEMO ALERT CARD at check-in to the Emergency Department and triage nurse.   

## 2018-09-07 NOTE — Telephone Encounter (Signed)
Per 4/27 los appt already scheduled.

## 2018-09-21 ENCOUNTER — Inpatient Hospital Stay: Payer: Medicare Other

## 2018-09-21 ENCOUNTER — Other Ambulatory Visit: Payer: Self-pay

## 2018-09-21 ENCOUNTER — Inpatient Hospital Stay: Payer: Medicare Other | Attending: Hematology

## 2018-09-21 VITALS — BP 133/65 | HR 68 | Temp 97.8°F | Resp 17

## 2018-09-21 DIAGNOSIS — C9000 Multiple myeloma not having achieved remission: Secondary | ICD-10-CM | POA: Insufficient documentation

## 2018-09-21 DIAGNOSIS — Z87891 Personal history of nicotine dependence: Secondary | ICD-10-CM | POA: Insufficient documentation

## 2018-09-21 DIAGNOSIS — Z5112 Encounter for antineoplastic immunotherapy: Secondary | ICD-10-CM | POA: Insufficient documentation

## 2018-09-21 DIAGNOSIS — C7951 Secondary malignant neoplasm of bone: Secondary | ICD-10-CM

## 2018-09-21 DIAGNOSIS — Z7189 Other specified counseling: Secondary | ICD-10-CM

## 2018-09-21 LAB — CMP (CANCER CENTER ONLY)
ALT: 16 U/L (ref 0–44)
AST: 16 U/L (ref 15–41)
Albumin: 3.6 g/dL (ref 3.5–5.0)
Alkaline Phosphatase: 78 U/L (ref 38–126)
Anion gap: 11 (ref 5–15)
BUN: 19 mg/dL (ref 8–23)
CO2: 24 mmol/L (ref 22–32)
Calcium: 8.9 mg/dL (ref 8.9–10.3)
Chloride: 106 mmol/L (ref 98–111)
Creatinine: 1.19 mg/dL (ref 0.61–1.24)
GFR, Est AFR Am: >60 mL/min (ref >60)
GFR, Estimated: 57 mL/min — ABNORMAL LOW (ref >60)
Glucose, Bld: 104 mg/dL — ABNORMAL HIGH (ref 70–99)
Potassium: 3.9 mmol/L (ref 3.5–5.1)
Sodium: 141 mmol/L (ref 135–145)
Total Bilirubin: 0.4 mg/dL (ref 0.3–1.2)
Total Protein: 6.7 g/dL (ref 6.5–8.1)

## 2018-09-21 LAB — CBC WITH DIFFERENTIAL/PLATELET
Abs Immature Granulocytes: 0.02 10*3/uL (ref 0.00–0.07)
Basophils Absolute: 0.1 10*3/uL (ref 0.0–0.1)
Basophils Relative: 1 %
Eosinophils Absolute: 0.5 10*3/uL (ref 0.0–0.5)
Eosinophils Relative: 6 %
HCT: 41.7 % (ref 39.0–52.0)
Hemoglobin: 13.3 g/dL (ref 13.0–17.0)
Immature Granulocytes: 0 %
Lymphocytes Relative: 47 %
Lymphs Abs: 3.3 10*3/uL (ref 0.7–4.0)
MCH: 27.1 pg (ref 26.0–34.0)
MCHC: 31.9 g/dL (ref 30.0–36.0)
MCV: 84.9 fL (ref 80.0–100.0)
Monocytes Absolute: 0.6 10*3/uL (ref 0.1–1.0)
Monocytes Relative: 8 %
Neutro Abs: 2.8 10*3/uL (ref 1.7–7.7)
Neutrophils Relative %: 38 %
Platelets: 135 10*3/uL — ABNORMAL LOW (ref 150–400)
RBC: 4.91 MIL/uL (ref 4.22–5.81)
RDW: 14.4 % (ref 11.5–15.5)
WBC: 7.2 10*3/uL (ref 4.0–10.5)
nRBC: 0 % (ref 0.0–0.2)

## 2018-09-21 MED ORDER — MONTELUKAST SODIUM 10 MG PO TABS
ORAL_TABLET | ORAL | Status: AC
  Filled 2018-09-21: qty 1

## 2018-09-21 MED ORDER — ACETAMINOPHEN 325 MG PO TABS
650.0000 mg | ORAL_TABLET | Freq: Once | ORAL | Status: AC
  Administered 2018-09-21: 650 mg via ORAL

## 2018-09-21 MED ORDER — ACETAMINOPHEN 325 MG PO TABS
ORAL_TABLET | ORAL | Status: AC
  Filled 2018-09-21: qty 2

## 2018-09-21 MED ORDER — FAMOTIDINE IN NACL 20-0.9 MG/50ML-% IV SOLN
INTRAVENOUS | Status: AC
  Filled 2018-09-21: qty 50

## 2018-09-21 MED ORDER — SODIUM CHLORIDE 0.9 % IV SOLN
16.2000 mg/kg | Freq: Once | INTRAVENOUS | Status: AC
  Administered 2018-09-21: 12:00:00 1400 mg via INTRAVENOUS
  Filled 2018-09-21: qty 60

## 2018-09-21 MED ORDER — FAMOTIDINE IN NACL 20-0.9 MG/50ML-% IV SOLN
20.0000 mg | Freq: Once | INTRAVENOUS | Status: AC
  Administered 2018-09-21: 10:00:00 20 mg via INTRAVENOUS

## 2018-09-21 MED ORDER — DIPHENHYDRAMINE HCL 25 MG PO CAPS
50.0000 mg | ORAL_CAPSULE | Freq: Once | ORAL | Status: AC
  Administered 2018-09-21: 10:00:00 50 mg via ORAL

## 2018-09-21 MED ORDER — MONTELUKAST SODIUM 10 MG PO TABS
10.0000 mg | ORAL_TABLET | Freq: Once | ORAL | Status: AC
  Administered 2018-09-21: 10:00:00 10 mg via ORAL

## 2018-09-21 MED ORDER — DIPHENHYDRAMINE HCL 25 MG PO CAPS
ORAL_CAPSULE | ORAL | Status: AC
  Filled 2018-09-21: qty 2

## 2018-09-21 MED ORDER — SODIUM CHLORIDE 0.9 % IV SOLN
Freq: Once | INTRAVENOUS | Status: AC
  Administered 2018-09-21: 10:00:00 via INTRAVENOUS
  Filled 2018-09-21: qty 250

## 2018-09-21 MED ORDER — SODIUM CHLORIDE 0.9 % IV SOLN
20.0000 mg | Freq: Once | INTRAVENOUS | Status: AC
  Administered 2018-09-21: 10:00:00 20 mg via INTRAVENOUS
  Filled 2018-09-21: qty 2

## 2018-09-21 NOTE — Patient Instructions (Signed)
[facility] Discharge Instructions for Patients Receiving Chemotherapy  Today you received the following chemotherapy agents:  Darzalex  To help prevent nausea and vomiting after your treatment, we encourage you to take your nausea medication as prescribed.   If you develop nausea and vomiting that is not controlled by your nausea medication, call the clinic.   BELOW ARE SYMPTOMS THAT SHOULD BE REPORTED IMMEDIATELY:  *FEVER GREATER THAN 100.5 F  *CHILLS WITH OR WITHOUT FEVER  NAUSEA AND VOMITING THAT IS NOT CONTROLLED WITH YOUR NAUSEA MEDICATION  *UNUSUAL SHORTNESS OF BREATH  *UNUSUAL BRUISING OR BLEEDING  TENDERNESS IN MOUTH AND THROAT WITH OR WITHOUT PRESENCE OF ULCERS  *URINARY PROBLEMS  *BOWEL PROBLEMS  UNUSUAL RASH Items with * indicate a potential emergency and should be followed up as soon as possible.  Feel free to call the clinic should you have any questions or concerns. The clinic phone number is [phone].  Please show the [facility] at check-in to the Emergency Department and triage nurse.  Coronavirus (COVID-19) Are you at risk?  Are you at risk for the Coronavirus (COVID-19)?  To be considered HIGH RISK for Coronavirus (COVID-19), you have to meet the following criteria:  . Traveled to [place], [place], [place], [place] or [place]; or in the [place] to [place], [place], [place], or [place]; and have fever, cough, and shortness of breath within the last 2 weeks of travel OR . Been in close contact with a person diagnosed with COVID-19 within the last 2 weeks and have fever, cough, and shortness of breath . IF YOU DO NOT MEET THESE CRITERIA, YOU ARE CONSIDERED LOW RISK FOR COVID-19.  What to do if you are HIGH RISK for COVID-19?  Marland Kitchen If you are having a medical emergency, call 911. . Seek medical care right away. Before you go to a doctor's office, urgent care or emergency department, call ahead and tell them about your  recent travel, contact with someone diagnosed with COVID-19, and your symptoms. You should receive instructions from your physician's office regarding next steps of care.  . When you arrive at healthcare provider, tell the healthcare staff immediately you have returned from visiting [place], [place], [place], [place] or [place]; or traveled in the [place] to [place], [place], [place], or [place]; in the last two weeks or you have been in close contact with a person diagnosed with COVID-19 in the last 2 weeks.   . Tell the health care staff about your symptoms: fever, cough and shortness of breath. . After you have been seen by a medical provider, you will be either: o Tested for (COVID-19) and discharged home on quarantine except to seek medical care if symptoms worsen, and asked to  - Stay home and avoid contact with others until you get your results (4-5 days)  - Avoid travel on public transportation if possible (such as bus, train, or airplane) or o Sent to the Emergency Department by EMS for evaluation, COVID-19 testing, and possible admission depending on your condition and test results.  What to do if you are LOW RISK for COVID-19?  Reduce your risk of any infection by using the same precautions used for avoiding the common cold or flu:  Marland Kitchen Wash your hands often with soap and warm water for at least 20 seconds.  If soap and water are not readily available, use an alcohol-based hand sanitizer with at least 60% alcohol.  . If coughing or  sneezing, cover your mouth and nose by coughing or sneezing into the elbow areas of your shirt or coat, into a tissue or into your sleeve (not your hands). . Avoid shaking hands with others and consider head nods or verbal greetings only. . Avoid touching your eyes, nose, or mouth with unwashed hands.  . Avoid close contact with people who are sick. . Avoid places or events with large numbers of people in one location, like concerts or sporting  events. . Carefully consider travel plans you have or are making. . If you are planning any travel outside or inside the [place], visit the CDC's Travelers' Health webpage for the latest health notices. . If you have some symptoms but not all symptoms, continue to monitor at home and seek medical attention if your symptoms worsen. . If you are having a medical emergency, call 911.   [place] / e-Visit: [email or web address]         [facility]: [facility]: [phone]                   [facility]: [phone]

## 2018-10-02 NOTE — Progress Notes (Signed)
HEMATOLOGY/ONCOLOGY CLINIC NOTE  Date of Service: 10/06/2018  Patient Care Team: [name], MD as PCP - General (Family Medicine) [name], MD (Cardiology) [name], MD (Cardiology)  CHIEF COMPLAINTS/PURPOSE OF CONSULTATION:  Continue mx of myeloma  HISTORY OF PRESENTING ILLNESS:   Jonathon [name] is a wonderful 83 y.o. male who has been referred to [place] by Dr. [name] for evaluation and management of Lytic bone lesions. He is accompanied today by his daughter. The pt reports that he is doing well overall. He is currently a resident at [facility] in [place], [place].   The pt presented to the ED on 03/24/18 with a right distal clavicle fracture, which occurred after trying to get out of bed and pushing himself up with his right arm. He notes that his energy has been noticeably decreased for the last month. He also notes that his lower back has a slight sting when trying to stand up from sitting. He also notes some pain in his lower, right chest wall, which is exacerbated when coughing. The pt has been taking 78m Tramadol which is controlling his pain. He does endorse some weakening of both of his lower extremities in the past 1-2 months, which he attributes somewhat to his recent lower back pain.   The pt denies any bowel abnormalities recently. He notes that he stays very well hydrated, and urinates 4-5 times a night, and does have BPH, and takes Flomax. The pt denies any recent changes in his urination habits.   The pt notes that his appetite decreased for a few months, until he began assisted living. He has lost 9 pounds in the last week.   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 He now uses a walker to ambulate. He notes that the CIDP symptoms are primarily located to his legs. He sees Dr. [name] [facility]. He took steroids  without any symptomatic relief. IVIG was cost-prohibitive for the patient, but was recommended.    Regarding the patient's anemia in April 2019, he denies any obvious bleeding at that time, and denies surgeries.    The pt notes that his last colonoscopy, in 2015, was not concerning, which is corroborated by chart review.   The pt and daughter deny any concerns for memory issues, and the daughter adds his memory is "elephant-like". They are not sure why Namenda is on his medication list.   The pt denies any recent dental procedures, or concerns for dental issues. He has maintained every 6 month dental cleanings for all of his life.   Of note prior to the patient's visit today, pt has had a CT C/A/P completed on 03/31/18 with results revealing IMPRESSION: 1. Lytic lesion throughout all visualized vertebra from the lower cervical spine through the sacrum. Epidural extension of tumor T2, T3 and L2 level. Right L3-4 foraminal extension tumor. 2. Lytic lesions involving majority of ribs. Multiple pathologic rib fractures with bony expansion/sub pleural extension of tumor at several levels. 3. Lytic lesions of the scapula, hips bilaterally and pelvis bilaterally. With further progression of tumor, patient may be at risk for pathologic fractures. Pathologic fracture right clavicle incompletely assessed. 4. Circumferential narrowing sigmoid colon. Question possibility of underlying mass (series 2, image 97). Alternatively this could represent muscular hypertrophy/peristalsis. 5. Gastric antrum circumferential narrowing. Cannot exclude mass although this  may be related to peristalsis. 6. No primary lung malignancy noted. 7. Matted aortic pulmonary window lymph nodes. 8. Circumferential urinary bladder wall thickening greater anterior dome region. Right lateral bladder diverticulum. 9. 5 mm low-density lesion pancreatic body (series 5, image 10) unchanged. This is felt unlikely to be a primary malignancy. 10.  Right parapelvic 2.2 cm cyst without significant change. Scattered low-density renal lesions bila2terally too small to adequately characterize. Some were present previously. Statistically these are likely cysts. 11. Stable adrenal gland hyperplasia. 12. Gynecomastia. 13. Prostate gland causes slight impression upon the bladder base. 14.  Aortic Atherosclerosis. 15. Sequential pacemaker in place. 16. Gallstones.  Most recent lab results (03/25/18) of CBC w/diff and CMP is as follows: all values are WNL except for RBC at 4.14, HGB at 11.3, HCT at 33.6, Chloride at 94, CO2 at 33, Glucose at 100, BUN at 25, Total Protein at 9.4, Albumin at 3.4, Calcium at 13.8, GFR at 58. 03/25/18 PSA, Total was normal at 1.2  On review of systems, pt reports positional lower back pain, lower right chest wall pain, recently decreased energy levels, relatively weaker appetite, weight loss, right clavicle pain, weakening of the lower extremities, moving his bowels well, and denies changes in urination, abdominal pains, changes in breathing, changes in bowel habits, pain along the spine, leg swelling, and any other symptoms.  Interval History:   Jonathon [name] returns today for management, evaluation, and C6D15 treatment of his Multiple Myeloma. The patient's last visit with [place] was on 09/07/18. The pt reports that he is doing well overall.  The pt reports that he has not developed any new concerns since our last visit. He notes that he has had some small, clear secretions which he coughs up about twice a day. He denies concerns for infections and endorses good energy levels. He notes that he associates this with asthma and allergies. He denies any fevers or persistent cough.  He denies any bone pains. He notes that he is "eating too well." He is continuing to stay active and notes that just today he rode a stationary bike for 38 minutes.  Lab results today (10/06/18) of CBC w/diff and CMP is as follows: all values are WNL except  for HGB at 12.8, PLT at 128k.  On review of systems, pt reports good energy levels, clear phlegm, eating well, staying active,  and denies concerns for infections, bone pains, fevers, cough, changes in breathing, leg swelling, pain along the spine, and any other symptoms.   MEDICAL HISTORY:  Past Medical History:  Diagnosis Date   Asthma    BENIGN PROSTATIC HYPERTROPHY, HX OF 10/25/2008   Bone metastases ([place]) 04/14/2018   BRADYCARDIA 2005   Chronic diastolic congestive heart failure ([place]) 06/05/2016   CIDP (chronic inflammatory demyelinating polyneuropathy) ([place])    HYPERTENSION 11/21/2006   Iron deficiency anemia, unspecified 04/19/2013   Multiple myeloma ([place]) 04/29/2018   NEPHROLITHIASIS, HX OF 11/21/2006 and 10/15/2008   PACEMAKER, PERMANENT 2005   Gen change 2014 Medtronic Adaptic L dual-chamber pacemaker, serial #[id number]    SVT (supraventricular tachycardia) ([place])    TOBACCO ABUSE 10/25/2008   Quit 2012    SURGICAL HISTORY: Past Surgical History:  Procedure Laterality Date   COLONOSCOPY  2004,2009   PACEMAKER INSERTION  2005   PERMANENT PACEMAKER GENERATOR CHANGE N/A 06/19/2012   Procedure: PERMANENT PACEMAKER GENERATOR CHANGE;  Surgeon: [name], MD; Medtronic Adaptic L dual-chamber pacemaker, serial #[id number]     SKIN GRAFT Right 1962  wrist    SOCIAL HISTORY: Social History   Socioeconomic History   Marital status: Divorced    Spouse name: Not on file   Number of children: 4   Years of education: Not on file   Highest education level: Not on file  Occupational History   Occupation: ENVIROMENTAL SERVICE    Employer: [employer]   Occupation: retired  Scientist, product/process development strain: Not on file   Food insecurity:    Worry: Not on file    Inability: Not on file   Transportation needs:    Medical: Not on file    Non-medical: Not on file  Tobacco Use   Smoking status: Former Smoker    Packs/day: 0.25     Years: 46.00    Pack years: 11.50    Types: Cigarettes    Start date: 03/16/1965    Last attempt to quit: 06/15/2010    Years since quitting: 8.3   Smokeless tobacco: Never Used   Tobacco comment: quit 3 years ago  Substance and Sexual Activity   Alcohol use: No    Alcohol/week: 0.0 standard drinks   Drug use: No   Sexual activity: Not on file  Lifestyle   Physical activity:    Days per week: Not on file    Minutes per session: Not on file   Stress: Not on file  Relationships   Social connections:    Talks on phone: Not on file    Gets together: Not on file    Attends religious service: Not on file    Active member of club or organization: Not on file    Attends meetings of clubs or organizations: Not on file    Relationship status: Not on file   Intimate partner violence:    Fear of current or ex partner: Not on file    Emotionally abused: Not on file    Physically abused: Not on file    Forced sexual activity: Not on file  Other Topics Concern   Not on file  Social History Narrative   Has relocated from [place] in 2007. 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    FAMILY HISTORY: Family History  Problem Relation Age of Onset   Heart attack Father        Died, 78   Kidney disease Father    Parkinson's disease Mother        Died, 25   Breast cancer Sister        Living, 66   Diabetes type II Brother        Living, 66   Breast cancer Sister        Living, 58   Diabetes Daughter        Living, 69   Diabetes Son        Living, 10   Ovarian cancer Daughter    Colon cancer Neg Hx    Rectal cancer Neg Hx    Stomach cancer Neg Hx     ALLERGIES:  is allergic to lexapro [escitalopram oxalate].  MEDICATIONS:  Current Outpatient Medications  Medication Sig Dispense Refill   acyclovir (ZOVIRAX) 400 MG tablet Take 1 tablet (400 mg total) by mouth 2 (two) times daily. 60 tablet 11   aspirin EC 81 MG tablet Take 81 mg by mouth daily.      dexamethasone (DECADRON) 4 MG tablet 81m (5 tabs) with breakfast the day after each Daratumumab infusion 20 tablet 4  flecainide (TAMBOCOR) 50 MG tablet Take 75 mg by mouth daily as needed.     furosemide (LASIX) 40 MG tablet Take 40 mg by mouth daily.     irbesartan (AVAPRO) 300 MG tablet Take 300 mg by mouth daily.     Melatonin 3 MG CAPS Take 3 mg by mouth at bedtime.     memantine (NAMENDA) 5 MG tablet Take 5 mg by mouth 2 (two) times daily.     metoprolol tartrate (LOPRESSOR) 25 MG tablet Take 1 tablet (25 mg total) by mouth 2 (two) times daily.     ondansetron (ZOFRAN) 8 MG tablet Take 1 tablet (8 mg total) by mouth 2 (two) times daily as needed (Nausea or vomiting). 30 tablet 1   oseltamivir (TAMIFLU) 75 MG capsule Take 1 capsule (75 mg total) by mouth 2 (two) times daily. 9 capsule 0   polyethylene glycol (MIRALAX / GLYCOLAX) packet Take 17 g by mouth daily. 14 each 0   prochlorperazine (COMPAZINE) 10 MG tablet Take 1 tablet (10 mg total) by mouth every 6 (six) hours as needed (Nausea or vomiting). 30 tablet 1   Saccharomyces boulardii (FLORASTOR PO) Take 250 mg by mouth 2 (two) times daily.     tamsulosin (FLOMAX) 0.4 MG CAPS capsule Take 2 capsules (0.8 mg total) by mouth daily. 90 capsule 0   traMADol (ULTRAM) 50 MG tablet Take 1 tablet( 50 mg total) by mouth every 8 hrs as needed for pain 90 tablet 2   vitamin B-12 (CYANOCOBALAMIN) 500 MCG tablet Take 1,000 mcg by mouth daily.     Vitamin D, Ergocalciferol, (DRISDOL) 50000 units CAPS capsule Take 50,000 Units by mouth every 7 (seven) days.     No current facility-administered medications for this visit.     REVIEW OF SYSTEMS:    A 10+ POINT REVIEW OF SYSTEMS WAS OBTAINED including neurology, dermatology, psychiatry, cardiac, respiratory, lymph, extremities, GI, GU, Musculoskeletal, constitutional, breasts, reproductive, HEENT.  All pertinent positives are noted in the HPI.  All others are negative.  PHYSICAL  EXAMINATION: ECOG PERFORMANCE STATUS: 2 - Symptomatic, <50% confined to bed  VS stable  GENERAL:alert, in no acute distress and comfortable SKIN: no acute rashes, no significant lesions EYES: conjunctiva are pink and non-injected, sclera anicteric OROPHARYNX: MMM, no exudates, no oropharyngeal erythema or ulceration NECK: supple, no JVD LYMPH:  no palpable lymphadenopathy in the cervical, axillary or inguinal regions LUNGS: clear to auscultation b/l with normal respiratory effort HEART: regular rate & rhythm ABDOMEN:  normoactive bowel sounds , non tender, not distended. No palpable hepatosplenomegaly.  Extremity: no pedal edema PSYCH: alert & oriented x 3 with fluent speech NEURO: no focal motor/sensory deficits   LABORATORY DATA:  I have reviewed the data as listed  . CBC Latest Ref Rng & Units 10/06/2018 09/21/2018 09/07/2018  WBC 4.0 - 10.5 K/uL 6.6 7.2 6.3  Hemoglobin 13.0 - 17.0 g/dL 12.8(L) 13.3 12.9(L)  Hematocrit 39.0 - 52.0 % 40.2 41.7 41.3  Platelets 150 - 400 K/uL 128(L) 135(L) 162    . CMP Latest Ref Rng & Units 10/06/2018 09/21/2018 09/07/2018  Glucose 70 - 99 mg/dL 89 104(H) 92  BUN 8 - 23 mg/dL '17 19 11  ' Creatinine 0.61 - 1.24 mg/dL 1.01 1.19 1.03  Sodium 135 - 145 mmol/L 142 141 140  Potassium 3.5 - 5.1 mmol/L 4.2 3.9 4.2  Chloride 98 - 111 mmol/L 108 106 106  CO2 22 - 32 mmol/L '25 24 24  ' Calcium 8.9 - 10.3 mg/dL  8.9 8.9 8.6(L)  Total Protein 6.5 - 8.1 g/dL 6.8 6.7 6.7  Total Bilirubin 0.3 - 1.2 mg/dL 0.7 0.4 0.4  Alkaline Phos 38 - 126 U/L 77 78 86  AST 15 - 41 U/L '17 16 16  ' ALT 0 - 44 U/L '15 16 12   ' 04/17/18 BM Bx:    RADIOGRAPHIC STUDIES: I have personally reviewed the radiological images as listed and agreed with the findings in the report. No results found.  ASSESSMENT & PLAN:  83 y.o. male with  1. Lytic bone lesions - w/u consistent with newly diagnosed multiple myeloma Labs upon initial presentation from 04/14/18; hypercalcemic with Calcium at  13.8, renal function up form baseline with Creatinine at 1.48, HGB at 11.3. PSA was normal.   03/31/18 CT C/A/P revealed 1. Lytic lesion throughout all visualized vertebra from the lower cervical spine through the sacrum. Epidural extension of tumor T2, T3 and L2 level. Right L3-4 foraminal extension tumor. 2. Lytic lesions involving majority of ribs. Multiple pathologic rib fractures with bony expansion/sub pleural extension of tumor at several levels. 3. Lytic lesions of the scapula, hips bilaterally and pelvis bilaterally. With further progression of tumor, patient may be at risk for pathologic fractures. Pathologic fracture right clavicle incompletely assessed. 4. Circumferential narrowing sigmoid colon. Question possibility of underlying mass (series 2, image 97). Alternatively this could represent muscular hypertrophy/peristalsis. 5. Gastric antrum circumferential narrowing. Cannot exclude mass although this may be related to peristalsis. 6. No primary lung malignancy noted. 7. Matted aortic pulmonary window lymph nodes. 8. Circumferential urinary bladder wall thickening greater anterior dome region. Right lateral bladder diverticulum. 9. 5 mm low-density lesion pancreatic body (series 5, image 10) unchanged. This is felt unlikely to be a primary malignancy. 10. Right parapelvic 2.2 cm cyst without significant change. Scattered low-density renal lesions bilaterally too small to adequately characterize. Some were present previously. Statistically these are likely cysts. 11. Stable adrenal gland hyperplasia. 12. Gynecomastia. 13. Prostate gland causes slight impression upon the bladder base. 14.  Aortic Atherosclerosis. 15. Sequential pacemaker in place. 16. Gallstones.   04/23/18 PET/CT revealed Innumerable hypermetabolic lytic lesions throughout the skeleton especially involving the spine, ribs, bony pelvis along with some involvement of both proximal femurs. The appearance is compatible with pathologic  diagnosis of plasma cell neoplasm could well reflect multiple myeloma. Resulting pathologic fractures of the right lateral clavicle and of multiple bilateral ribs. Lesions are present along the cortical margins of the spinal canal. 2. Both the area and the stomach in the area in the sigmoid colon drawn attention to on prior CT scan appear benign normal today. 3. The confluence of AP window lymph nodes measures about 1.2 cm in short axis with maximum SUV 3.1, which is mildly above the blood pool merit surveillance. 4. Other imaging findings of potential clinical significance: Aortic Atherosclerosis. Coronary atherosclerosis. Pacemaker noted. Borderline prostatomegaly. Cholelithiasis.  04/21/18 BM Bx revealed Normocellular bone marrow with Plasma Cell neoplasm. Scattered medium sized clusters of kappa-restricted plasma cells (14% aspirate, 10% CD138 immunohistochemistry.   I do suspect that the burden of disease is underestimated in the bone marrow sample given PET/CT findings of innumerable lytic lesions and an M spike of 3.4g   06/08/18 DG Hips bilat which revealed Innumerable lucencies are noted throughout the pelvis and proximal femurs, similar findings noted on prior PET-CT of 04/23/2018. Findings are consistent patient's known multiple myeloma. Femoral necks are intact. 2.  Aortoiliac atherosclerotic vascular disease.  2. Pathologic right clavicle fracture due to myeloma  3. Hypercalcemia 03/25/18 CMP revealed Calcium at 13.8-- now down to 11.1 Will begin Xgeva injections, and strongly recommended keeping very well hydrated   PLAN:  -Discussed pt labwork today, 10/06/18; blood counts and chemistries are stable -08/24/18 MMP revealed M Protein at 0.1g. -Will check MMP again in 2 weeks with C7D1 -The pt has no prohibitive toxicities from continuing C6D15 Daratumumab and Dexamethasone, at this time. -Will begin monthly Daratumumab and Dexamethasone with C7, switching from every 2 weeks -Will discuss  further treatment goals at next visit and consideration of ongoing maintenance Daratumumab -Discussed that due to the low IgG Kappa M spike, pt could now be in remission, as his M Protein has IgG kappa specificity, which is the same as the Daratumumab antibody. -No strong indication to add on additional medication like Revlimid at this time -Previously discussed the option to pursue transplant consideration vs maintenance therapy after completing induction. The pt notes that he is very comfortable with not pursuing transplant, and is appreciating the current preservation of his quality of life without intrusive therapies. -Will recheck Vitamin D levels and will adjust replacement as indicated -CIDP has been considered in the treatment choice -Continue Xgeva every 4 weeks -Again advised that the pt limit his physical stressors, and limit bending, pushing, pulling and stairs to limit risk of further pathologic bone fractures -Recommended that the pt continue to eat well, drink at least 48-64 oz of water each day, and walk 20-30 minutes each day.  -Will see the pt back in 6 weeks with C8D1   F/u as per scheduled appointments on 6/8 and 11/16/2018 Next MD visit on 11/16/2018   All of the patients questions were answered with apparent satisfaction. The patient knows to call the clinic with any problems, questions or concerns.  . The total time spent in the appointment was 25 minutes and more than 50% was on counseling and direct patient cares.      Sullivan Lone MD MS AAHIVMS [facility] Hematology/Oncology Physician [facility]  (Office):       [phone] (Work cell):  [phone] (Fax):           [phone]  10/06/2018 11:17 AM  I, Baldwin [place], am acting as a scribe for Dr. Sullivan Lone.   .I have reviewed the above documentation for accuracy and completeness, and I agree with the above. Brunetta Genera MD

## 2018-10-06 ENCOUNTER — Inpatient Hospital Stay (HOSPITAL_BASED_OUTPATIENT_CLINIC_OR_DEPARTMENT_OTHER): Payer: Medicare Other | Admitting: Hematology

## 2018-10-06 ENCOUNTER — Other Ambulatory Visit: Payer: Self-pay

## 2018-10-06 ENCOUNTER — Inpatient Hospital Stay: Payer: Medicare Other

## 2018-10-06 VITALS — BP 161/95 | HR 67 | Temp 98.7°F | Resp 18 | Ht 71.0 in | Wt 204.6 lb

## 2018-10-06 VITALS — BP 156/76 | HR 64 | Temp 98.2°F | Resp 18

## 2018-10-06 DIAGNOSIS — Z5112 Encounter for antineoplastic immunotherapy: Secondary | ICD-10-CM

## 2018-10-06 DIAGNOSIS — C9000 Multiple myeloma not having achieved remission: Secondary | ICD-10-CM

## 2018-10-06 DIAGNOSIS — Z87891 Personal history of nicotine dependence: Secondary | ICD-10-CM | POA: Diagnosis not present

## 2018-10-06 DIAGNOSIS — Z7189 Other specified counseling: Secondary | ICD-10-CM

## 2018-10-06 DIAGNOSIS — C7951 Secondary malignant neoplasm of bone: Secondary | ICD-10-CM

## 2018-10-06 LAB — CBC WITH DIFFERENTIAL/PLATELET
Abs Immature Granulocytes: 0.01 10*3/uL (ref 0.00–0.07)
Basophils Absolute: 0.1 10*3/uL (ref 0.0–0.1)
Basophils Relative: 1 %
Eosinophils Absolute: 0.5 10*3/uL (ref 0.0–0.5)
Eosinophils Relative: 7 %
HCT: 40.2 % (ref 39.0–52.0)
Hemoglobin: 12.8 g/dL — ABNORMAL LOW (ref 13.0–17.0)
Immature Granulocytes: 0 %
Lymphocytes Relative: 42 %
Lymphs Abs: 2.8 10*3/uL (ref 0.7–4.0)
MCH: 27.8 pg (ref 26.0–34.0)
MCHC: 31.8 g/dL (ref 30.0–36.0)
MCV: 87.4 fL (ref 80.0–100.0)
Monocytes Absolute: 0.5 10*3/uL (ref 0.1–1.0)
Monocytes Relative: 7 %
Neutro Abs: 2.8 10*3/uL (ref 1.7–7.7)
Neutrophils Relative %: 43 %
Platelets: 128 10*3/uL — ABNORMAL LOW (ref 150–400)
RBC: 4.6 MIL/uL (ref 4.22–5.81)
RDW: 14.2 % (ref 11.5–15.5)
WBC: 6.6 10*3/uL (ref 4.0–10.5)
nRBC: 0 % (ref 0.0–0.2)

## 2018-10-06 LAB — CMP (CANCER CENTER ONLY)
ALT: 15 U/L (ref 0–44)
AST: 17 U/L (ref 15–41)
Albumin: 3.7 g/dL (ref 3.5–5.0)
Alkaline Phosphatase: 77 U/L (ref 38–126)
Anion gap: 9 (ref 5–15)
BUN: 17 mg/dL (ref 8–23)
CO2: 25 mmol/L (ref 22–32)
Calcium: 8.9 mg/dL (ref 8.9–10.3)
Chloride: 108 mmol/L (ref 98–111)
Creatinine: 1.01 mg/dL (ref 0.61–1.24)
GFR, Est AFR Am: >60 mL/min (ref >60)
GFR, Estimated: >60 mL/min (ref >60)
Glucose, Bld: 89 mg/dL (ref 70–99)
Potassium: 4.2 mmol/L (ref 3.5–5.1)
Sodium: 142 mmol/L (ref 135–145)
Total Bilirubin: 0.7 mg/dL (ref 0.3–1.2)
Total Protein: 6.8 g/dL (ref 6.5–8.1)

## 2018-10-06 MED ORDER — MONTELUKAST SODIUM 10 MG PO TABS
ORAL_TABLET | ORAL | Status: AC
  Filled 2018-10-06: qty 1

## 2018-10-06 MED ORDER — SODIUM CHLORIDE 0.9 % IV SOLN
20.0000 mg | Freq: Once | INTRAVENOUS | Status: AC
  Administered 2018-10-06: 20 mg via INTRAVENOUS
  Filled 2018-10-06: qty 20

## 2018-10-06 MED ORDER — FAMOTIDINE IN NACL 20-0.9 MG/50ML-% IV SOLN
INTRAVENOUS | Status: AC
  Filled 2018-10-06: qty 50

## 2018-10-06 MED ORDER — DENOSUMAB 120 MG/1.7ML ~~LOC~~ SOLN
120.0000 mg | Freq: Once | SUBCUTANEOUS | Status: AC
  Administered 2018-10-06: 15:00:00 120 mg via SUBCUTANEOUS

## 2018-10-06 MED ORDER — DIPHENHYDRAMINE HCL 25 MG PO CAPS
ORAL_CAPSULE | ORAL | Status: AC
  Filled 2018-10-06: qty 1

## 2018-10-06 MED ORDER — DIPHENHYDRAMINE HCL 25 MG PO CAPS
50.0000 mg | ORAL_CAPSULE | Freq: Once | ORAL | Status: AC
  Administered 2018-10-06: 12:00:00 50 mg via ORAL

## 2018-10-06 MED ORDER — SODIUM CHLORIDE 0.9 % IV SOLN
Freq: Once | INTRAVENOUS | Status: AC
  Administered 2018-10-06: 12:00:00 via INTRAVENOUS
  Filled 2018-10-06: qty 250

## 2018-10-06 MED ORDER — MONTELUKAST SODIUM 10 MG PO TABS
10.0000 mg | ORAL_TABLET | Freq: Once | ORAL | Status: AC
  Administered 2018-10-06: 12:00:00 10 mg via ORAL

## 2018-10-06 MED ORDER — FAMOTIDINE IN NACL 20-0.9 MG/50ML-% IV SOLN
20.0000 mg | Freq: Once | INTRAVENOUS | Status: AC
  Administered 2018-10-06: 12:00:00 20 mg via INTRAVENOUS

## 2018-10-06 MED ORDER — ACETAMINOPHEN 325 MG PO TABS
ORAL_TABLET | ORAL | Status: AC
  Filled 2018-10-06: qty 2

## 2018-10-06 MED ORDER — ACETAMINOPHEN 325 MG PO TABS
650.0000 mg | ORAL_TABLET | Freq: Once | ORAL | Status: AC
  Administered 2018-10-06: 650 mg via ORAL

## 2018-10-06 MED ORDER — SODIUM CHLORIDE 0.9 % IV SOLN
16.2000 mg/kg | Freq: Once | INTRAVENOUS | Status: AC
  Administered 2018-10-06: 14:00:00 1400 mg via INTRAVENOUS
  Filled 2018-10-06: qty 60

## 2018-10-06 NOTE — Patient Instructions (Addendum)
[facility] Discharge Instructions for Patients Receiving Chemotherapy  Today you received the following chemotherapy agents:  Darzalex  To help prevent nausea and vomiting after your treatment, we encourage you to take your nausea medication as prescribed.   If you develop nausea and vomiting that is not controlled by your nausea medication, call the clinic.   BELOW ARE SYMPTOMS THAT SHOULD BE REPORTED IMMEDIATELY:  *FEVER GREATER THAN 100.5 F  *CHILLS WITH OR WITHOUT FEVER  NAUSEA AND VOMITING THAT IS NOT CONTROLLED WITH YOUR NAUSEA MEDICATION  *UNUSUAL SHORTNESS OF BREATH  *UNUSUAL BRUISING OR BLEEDING  TENDERNESS IN MOUTH AND THROAT WITH OR WITHOUT PRESENCE OF ULCERS  *URINARY PROBLEMS  *BOWEL PROBLEMS  UNUSUAL RASH Items with * indicate a potential emergency and should be followed up as soon as possible.  Feel free to call the clinic should you have any questions or concerns. The clinic phone number is [phone].  Please show the [facility] at check-in to the Emergency Department and triage nurse.  Denosumab injection Delton See) What is this medicine? DENOSUMAB (den oh sue mab) slows bone breakdown. Prolia is used to treat osteoporosis in women after menopause and in men, and in people who are taking corticosteroids for 6 months or more. Delton See is used to treat a high calcium level due to cancer and to prevent bone fractures and other bone problems caused by multiple myeloma or cancer bone metastases. Delton See is also used to treat giant cell tumor of the bone. This medicine may be used for other purposes; ask your health care provider or pharmacist if you have questions. COMMON BRAND NAME(S): Prolia, XGEVA What should I tell my health care provider before I take this medicine? They need to know if you have any of these conditions: -dental disease -having surgery or tooth extraction -infection -kidney disease -low levels of calcium or Vitamin D in  the blood -malnutrition -on hemodialysis -skin conditions or sensitivity -thyroid or parathyroid disease -an unusual reaction to denosumab, other medicines, foods, dyes, or preservatives -pregnant or trying to get pregnant -breast-feeding How should I use this medicine? This medicine is for injection under the skin. It is given by a health care professional in a hospital or clinic setting. A special MedGuide will be given to you before each treatment. Be sure to read this information carefully each time. For Prolia, talk to your pediatrician regarding the use of this medicine in children. Special care may be needed. For Delton See, talk to your pediatrician regarding the use of this medicine in children. While this drug may be prescribed for children as young as 13 years for selected conditions, precautions do apply. Overdosage: If you think you have taken too much of this medicine contact a poison control center or emergency room at once. NOTE: This medicine is only for you. Do not share this medicine with others. What if I miss a dose? It is important not to miss your dose. Call your doctor or health care professional if you are unable to keep an appointment. What may interact with this medicine? Do not take this medicine with any of the following medications: -other medicines containing denosumab This medicine may also interact with the following medications: -medicines that lower your chance of fighting infection -steroid medicines like prednisone or cortisone This list may not describe all possible interactions. Give your health care provider a list of all the medicines, herbs, non-prescription drugs, or dietary supplements you use. Also tell them if you smoke, drink  alcohol, or use illegal drugs. Some items may interact with your medicine. What should I watch for while using this medicine? Visit your doctor or health care professional for regular checks on your progress. Your doctor or  health care professional may order blood tests and other tests to see how you are doing. Call your doctor or health care professional for advice if you get a fever, chills or sore throat, or other symptoms of a cold or flu. Do not treat yourself. This drug may decrease your body's ability to fight infection. Try to avoid being around people who are sick. You should make sure you get enough calcium and vitamin D while you are taking this medicine, unless your doctor tells you not to. Discuss the foods you eat and the vitamins you take with your health care professional. See your dentist regularly. Brush and floss your teeth as directed. Before you have any dental work done, tell your dentist you are receiving this medicine. Do not become pregnant while taking this medicine or for 5 months after stopping it. Talk with your doctor or health care professional about your birth control options while taking this medicine. Women should inform their doctor if they wish to become pregnant or think they might be pregnant. There is a potential for serious side effects to an unborn child. Talk to your health care professional or pharmacist for more information. What side effects may I notice from receiving this medicine? Side effects that you should report to your doctor or health care professional as soon as possible: -allergic reactions like skin rash, itching or hives, swelling of the face, lips, or tongue -bone pain -breathing problems -dizziness -jaw pain, especially after dental work -redness, blistering, peeling of the skin -signs and symptoms of infection like fever or chills; cough; sore throat; pain or trouble passing urine -signs of low calcium like fast heartbeat, muscle cramps or muscle pain; pain, tingling, numbness in the hands or feet; seizures -unusual bleeding or bruising -unusually weak or tired Side effects that usually do not require medical attention (report to your doctor or health care  professional if they continue or are bothersome): -constipation -diarrhea -headache -joint pain -loss of appetite -muscle pain -runny nose -tiredness -upset stomach This list may not describe all possible side effects. Call your doctor for medical advice about side effects. You may report side effects to FDA at 1-800-FDA-1088. Where should I keep my medicine? This medicine is only given in a clinic, doctor's office, or other health care setting and will not be stored at home. NOTE: This sheet is a summary. It may not cover all possible information. If you have questions about this medicine, talk to your doctor, pharmacist, or health care provider.  2019 [employer] (2017-09-05 16:10:44)

## 2018-10-07 ENCOUNTER — Telehealth: Payer: Self-pay | Admitting: Hematology

## 2018-10-07 NOTE — Telephone Encounter (Signed)
Per 5/26 los appts already scheduled.

## 2018-10-19 ENCOUNTER — Inpatient Hospital Stay: Payer: Medicare Other

## 2018-10-19 ENCOUNTER — Inpatient Hospital Stay: Payer: Medicare Other | Attending: Hematology

## 2018-10-19 ENCOUNTER — Other Ambulatory Visit: Payer: Self-pay

## 2018-10-19 VITALS — BP 148/80 | HR 60 | Temp 98.0°F | Resp 18

## 2018-10-19 DIAGNOSIS — Z5112 Encounter for antineoplastic immunotherapy: Secondary | ICD-10-CM | POA: Diagnosis not present

## 2018-10-19 DIAGNOSIS — C9000 Multiple myeloma not having achieved remission: Secondary | ICD-10-CM | POA: Diagnosis not present

## 2018-10-19 DIAGNOSIS — Z7189 Other specified counseling: Secondary | ICD-10-CM

## 2018-10-19 LAB — CBC WITH DIFFERENTIAL/PLATELET
Abs Immature Granulocytes: 0.02 10*3/uL (ref 0.00–0.07)
Basophils Absolute: 0.1 10*3/uL (ref 0.0–0.1)
Basophils Relative: 1 %
Eosinophils Absolute: 0.4 10*3/uL (ref 0.0–0.5)
Eosinophils Relative: 7 %
HCT: 39.9 % (ref 39.0–52.0)
Hemoglobin: 12.5 g/dL — ABNORMAL LOW (ref 13.0–17.0)
Immature Granulocytes: 0 %
Lymphocytes Relative: 45 %
Lymphs Abs: 2.7 10*3/uL (ref 0.7–4.0)
MCH: 27.7 pg (ref 26.0–34.0)
MCHC: 31.3 g/dL (ref 30.0–36.0)
MCV: 88.3 fL (ref 80.0–100.0)
Monocytes Absolute: 0.5 10*3/uL (ref 0.1–1.0)
Monocytes Relative: 7 %
Neutro Abs: 2.5 10*3/uL (ref 1.7–7.7)
Neutrophils Relative %: 40 %
Platelets: 132 10*3/uL — ABNORMAL LOW (ref 150–400)
RBC: 4.52 MIL/uL (ref 4.22–5.81)
RDW: 14.5 % (ref 11.5–15.5)
WBC: 6.2 10*3/uL (ref 4.0–10.5)
nRBC: 0 % (ref 0.0–0.2)

## 2018-10-19 LAB — CMP (CANCER CENTER ONLY)
ALT: 11 U/L (ref 0–44)
AST: 17 U/L (ref 15–41)
Albumin: 3.7 g/dL (ref 3.5–5.0)
Alkaline Phosphatase: 81 U/L (ref 38–126)
Anion gap: 9 (ref 5–15)
BUN: 14 mg/dL (ref 8–23)
CO2: 22 mmol/L (ref 22–32)
Calcium: 8.7 mg/dL — ABNORMAL LOW (ref 8.9–10.3)
Chloride: 110 mmol/L (ref 98–111)
Creatinine: 1.16 mg/dL (ref 0.61–1.24)
GFR, Est AFR Am: >60 mL/min (ref >60)
GFR, Estimated: 59 mL/min — ABNORMAL LOW (ref >60)
Glucose, Bld: 113 mg/dL — ABNORMAL HIGH (ref 70–99)
Potassium: 4 mmol/L (ref 3.5–5.1)
Sodium: 141 mmol/L (ref 135–145)
Total Bilirubin: 0.6 mg/dL (ref 0.3–1.2)
Total Protein: 6.6 g/dL (ref 6.5–8.1)

## 2018-10-19 MED ORDER — SODIUM CHLORIDE 0.9 % IV SOLN
16.2000 mg/kg | Freq: Once | INTRAVENOUS | Status: AC
  Administered 2018-10-19: 1400 mg via INTRAVENOUS
  Filled 2018-10-19: qty 60

## 2018-10-19 MED ORDER — SODIUM CHLORIDE 0.9 % IV SOLN
20.0000 mg | Freq: Once | INTRAVENOUS | Status: AC
  Administered 2018-10-19: 20 mg via INTRAVENOUS
  Filled 2018-10-19: qty 20

## 2018-10-19 MED ORDER — DIPHENHYDRAMINE HCL 25 MG PO CAPS
50.0000 mg | ORAL_CAPSULE | Freq: Once | ORAL | Status: AC
  Administered 2018-10-19: 50 mg via ORAL

## 2018-10-19 MED ORDER — ACETAMINOPHEN 325 MG PO TABS
ORAL_TABLET | ORAL | Status: AC
  Filled 2018-10-19: qty 2

## 2018-10-19 MED ORDER — SODIUM CHLORIDE 0.9 % IV SOLN
Freq: Once | INTRAVENOUS | Status: AC
  Administered 2018-10-19: 12:00:00 via INTRAVENOUS
  Filled 2018-10-19: qty 250

## 2018-10-19 MED ORDER — ACETAMINOPHEN 325 MG PO TABS
650.0000 mg | ORAL_TABLET | Freq: Once | ORAL | Status: AC
  Administered 2018-10-19: 12:00:00 650 mg via ORAL

## 2018-10-19 MED ORDER — DIPHENHYDRAMINE HCL 25 MG PO CAPS
ORAL_CAPSULE | ORAL | Status: AC
  Filled 2018-10-19: qty 2

## 2018-10-19 MED ORDER — MONTELUKAST SODIUM 10 MG PO TABS
10.0000 mg | ORAL_TABLET | Freq: Once | ORAL | Status: AC
  Administered 2018-10-19: 12:00:00 10 mg via ORAL

## 2018-10-19 MED ORDER — FAMOTIDINE IN NACL 20-0.9 MG/50ML-% IV SOLN
INTRAVENOUS | Status: AC
  Filled 2018-10-19: qty 50

## 2018-10-19 MED ORDER — FAMOTIDINE IN NACL 20-0.9 MG/50ML-% IV SOLN
20.0000 mg | Freq: Once | INTRAVENOUS | Status: AC
  Administered 2018-10-19: 20 mg via INTRAVENOUS

## 2018-10-19 MED ORDER — MONTELUKAST SODIUM 10 MG PO TABS
ORAL_TABLET | ORAL | Status: AC
  Filled 2018-10-19: qty 1

## 2018-10-19 NOTE — Patient Instructions (Signed)
[facility] Discharge Instructions for Patients Receiving Chemotherapy  Today you received the following chemotherapy agents:  Darzalex  To help prevent nausea and vomiting after your treatment, we encourage you to take your nausea medication as prescribed.   If you develop nausea and vomiting that is not controlled by your nausea medication, call the clinic.   BELOW ARE SYMPTOMS THAT SHOULD BE REPORTED IMMEDIATELY:  *FEVER GREATER THAN 100.5 F  *CHILLS WITH OR WITHOUT FEVER  NAUSEA AND VOMITING THAT IS NOT CONTROLLED WITH YOUR NAUSEA MEDICATION  *UNUSUAL SHORTNESS OF BREATH  *UNUSUAL BRUISING OR BLEEDING  TENDERNESS IN MOUTH AND THROAT WITH OR WITHOUT PRESENCE OF ULCERS  *URINARY PROBLEMS  *BOWEL PROBLEMS  UNUSUAL RASH Items with * indicate a potential emergency and should be followed up as soon as possible.  Feel free to call the clinic should you have any questions or concerns. The clinic phone number is [phone].  Please show the CHEMO ALERT CARD at check-in to the Emergency Department and triage nurse.   

## 2018-10-20 LAB — MULTIPLE MYELOMA PANEL, SERUM
Albumin SerPl Elph-Mcnc: 3.4 g/dL (ref 2.9–4.4)
Albumin/Glob SerPl: 1.3 (ref 0.7–1.7)
Alpha 1: 0.2 g/dL (ref 0.0–0.4)
Alpha2 Glob SerPl Elph-Mcnc: 0.8 g/dL (ref 0.4–1.0)
B-Globulin SerPl Elph-Mcnc: 1.1 g/dL (ref 0.7–1.3)
Gamma Glob SerPl Elph-Mcnc: 0.6 g/dL (ref 0.4–1.8)
Globulin, Total: 2.7 g/dL (ref 2.2–3.9)
IgA: 159 mg/dL (ref 61–437)
IgG (Immunoglobin G), Serum: 641 mg/dL (ref 603–1613)
IgM (Immunoglobulin M), Srm: 46 mg/dL (ref 15–143)
M Protein SerPl Elph-Mcnc: 0.2 g/dL — ABNORMAL HIGH
Total Protein ELP: 6.1 g/dL (ref 6.0–8.5)

## 2018-10-29 DIAGNOSIS — Z20828 Contact with and (suspected) exposure to other viral communicable diseases: Secondary | ICD-10-CM | POA: Diagnosis not present

## 2018-10-30 DIAGNOSIS — Z20828 Contact with and (suspected) exposure to other viral communicable diseases: Secondary | ICD-10-CM | POA: Diagnosis not present

## 2018-11-02 ENCOUNTER — Inpatient Hospital Stay: Payer: Medicare Other

## 2018-11-02 ENCOUNTER — Other Ambulatory Visit: Payer: Self-pay

## 2018-11-02 VITALS — BP 148/78 | HR 62 | Temp 98.2°F | Resp 18

## 2018-11-02 DIAGNOSIS — C9000 Multiple myeloma not having achieved remission: Secondary | ICD-10-CM | POA: Diagnosis not present

## 2018-11-02 DIAGNOSIS — C7951 Secondary malignant neoplasm of bone: Secondary | ICD-10-CM

## 2018-11-02 DIAGNOSIS — Z5112 Encounter for antineoplastic immunotherapy: Secondary | ICD-10-CM | POA: Diagnosis not present

## 2018-11-02 DIAGNOSIS — Z7189 Other specified counseling: Secondary | ICD-10-CM

## 2018-11-02 MED ORDER — DENOSUMAB 120 MG/1.7ML ~~LOC~~ SOLN
120.0000 mg | Freq: Once | SUBCUTANEOUS | Status: AC
  Administered 2018-11-02: 120 mg via SUBCUTANEOUS

## 2018-11-02 MED ORDER — DENOSUMAB 120 MG/1.7ML ~~LOC~~ SOLN
SUBCUTANEOUS | Status: AC
  Filled 2018-11-02: qty 1.7

## 2018-11-02 NOTE — Progress Notes (Signed)
Per Dr.Kale verbal order - OK to receive Xgeva today with Ca 8.7.

## 2018-11-02 NOTE — Patient Instructions (Signed)

## 2018-11-11 ENCOUNTER — Ambulatory Visit: Payer: Self-pay | Admitting: Neurology

## 2018-11-12 NOTE — Progress Notes (Signed)
HEMATOLOGY/ONCOLOGY CLINIC NOTE  Date of Service: 11/16/2018  Patient Care Team: [name], MD as PCP - General (Family Medicine) [name], MD (Cardiology) [name], MD (Cardiology)  CHIEF COMPLAINTS/PURPOSE OF CONSULTATION:  Continue mx of myeloma  HISTORY OF PRESENTING ILLNESS:   Jonathon [name] is a wonderful 83 y.o. male who has been referred to [place] by Dr. [name] for evaluation and management of Lytic bone lesions. He is accompanied today by his daughter. The pt reports that he is doing well overall. He is currently a resident at [facility] in [place], [place].   The pt presented to the ED on 03/24/18 with a right distal clavicle fracture, which occurred after trying to get out of bed and pushing himself up with his right arm. He notes that his energy has been noticeably decreased for the last month. He also notes that his lower back has a slight sting when trying to stand up from sitting. He also notes some pain in his lower, right chest wall, which is exacerbated when coughing. The pt has been taking 2m Tramadol which is controlling his pain. He does endorse some weakening of both of his lower extremities in the past 1-2 months, which he attributes somewhat to his recent lower back pain.   The pt denies any bowel abnormalities recently. He notes that he stays very well hydrated, and urinates 4-5 times a night, and does have BPH, and takes Flomax. The pt denies any recent changes in his urination habits.   The pt notes that his appetite decreased for a few months, until he began assisted living. He has lost 9 pounds in the last week.   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 He now uses a walker to ambulate. He notes that the CIDP symptoms are primarily located to his legs. He sees Dr. [name]. He took steroids  without any symptomatic relief. IVIG was cost-prohibitive for the patient, but was recommended.    Regarding the patient's anemia in April 2019, he denies any obvious bleeding at that time, and denies surgeries.    The pt notes that his last colonoscopy, in 2015, was not concerning, which is corroborated by chart review.   The pt and daughter deny any concerns for memory issues, and the daughter adds his memory is "elephant-like". They are not sure why Namenda is on his medication list.   The pt denies any recent dental procedures, or concerns for dental issues. He has maintained every 6 month dental cleanings for all of his life.   Of note prior to the patient's visit today, pt has had a CT C/A/P completed on 03/31/18 with results revealing IMPRESSION: 1. Lytic lesion throughout all visualized vertebra from the lower cervical spine through the sacrum. Epidural extension of tumor T2, T3 and L2 level. Right L3-4 foraminal extension tumor. 2. Lytic lesions involving majority of ribs. Multiple pathologic rib fractures with bony expansion/sub pleural extension of tumor at several levels. 3. Lytic lesions of the scapula, hips bilaterally and pelvis bilaterally. With further progression of tumor, patient may be at risk for pathologic fractures. Pathologic fracture right clavicle incompletely assessed. 4. Circumferential narrowing sigmoid colon. Question possibility of underlying mass (series 2, image 97). Alternatively this could represent muscular hypertrophy/peristalsis. 5. Gastric antrum circumferential narrowing. Cannot exclude mass although this  may be related to peristalsis. 6. No primary lung malignancy noted. 7. Matted aortic pulmonary window lymph nodes. 8. Circumferential urinary bladder wall thickening greater anterior dome region. Right lateral bladder diverticulum. 9. 5 mm low-density lesion pancreatic body (series 5, image 10) unchanged. This is felt unlikely to be a primary malignancy. 10.  Right parapelvic 2.2 cm cyst without significant change. Scattered low-density renal lesions bila2terally too small to adequately characterize. Some were present previously. Statistically these are likely cysts. 11. Stable adrenal gland hyperplasia. 12. Gynecomastia. 13. Prostate gland causes slight impression upon the bladder base. 14.  Aortic Atherosclerosis. 15. Sequential pacemaker in place. 16. Gallstones.  Most recent lab results (03/25/18) of CBC w/diff and CMP is as follows: all values are WNL except for RBC at 4.14, HGB at 11.3, HCT at 33.6, Chloride at 94, CO2 at 33, Glucose at 100, BUN at 25, Total Protein at 9.4, Albumin at 3.4, Calcium at 13.8, GFR at 58. 03/25/18 PSA, Total was normal at 1.2  On review of systems, pt reports positional lower back pain, lower right chest wall pain, recently decreased energy levels, relatively weaker appetite, weight loss, right clavicle pain, weakening of the lower extremities, moving his bowels well, and denies changes in urination, abdominal pains, changes in breathing, changes in bowel habits, pain along the spine, leg swelling, and any other symptoms.  Interval History:   Jonathon [name] returns today for management, evaluation, and C6D15 treatment of his Multiple Myeloma. The patient's last visit with [place] was on 10/06/18. The pt reports that he is doing well overall.  The pt reports that he has been staying active and denies any new bone pains or back pains. He notes that he is eating well and endorses good energy levels. He denies leg swelling and presents today wearing his compression socks. He denies fevers, chills, or concerns for infections.  Lab results today (11/16/18) of CBC w/diff and CMP is as follows: all values are WNL except for HGB at 12.5, Calcium at 8.3. 11/16/18 MMP is pending  On review of systems, pt reports good energy levels, staying active, eating well, and denies bone pains, back pains, fevers, chills, concerns for infections, leg  swelling, and any other symptoms.   MEDICAL HISTORY:  Past Medical History:  Diagnosis Date   Asthma    BENIGN PROSTATIC HYPERTROPHY, HX OF 10/25/2008   Bone metastases ([place]) 04/14/2018   BRADYCARDIA 2005   Chronic diastolic congestive heart failure ([place]) 06/05/2016   CIDP (chronic inflammatory demyelinating polyneuropathy) ([place])    HYPERTENSION 11/21/2006   Iron deficiency anemia, unspecified 04/19/2013   Multiple myeloma ([place]) 04/29/2018   NEPHROLITHIASIS, HX OF 11/21/2006 and 10/15/2008   PACEMAKER, PERMANENT 2005   Gen change 2014 Medtronic Adaptic L dual-chamber pacemaker, serial #[id number]    SVT (supraventricular tachycardia) ([place])    TOBACCO ABUSE 10/25/2008   Quit 2012    SURGICAL HISTORY: Past Surgical History:  Procedure Laterality Date   COLONOSCOPY  2004,2009   PACEMAKER INSERTION  2005   PERMANENT PACEMAKER GENERATOR CHANGE N/A 06/19/2012   Procedure: PERMANENT PACEMAKER GENERATOR CHANGE;  Surgeon: [name], MD; Medtronic Adaptic L dual-chamber pacemaker, serial #[id number]     SKIN GRAFT Right 1962   wrist    SOCIAL HISTORY: Social History   Socioeconomic History   Marital status: Divorced    Spouse name: Not on file   Number of children: 4   Years of education: Not on file   Highest education level: Not on file  Occupational History   Occupation: ENVIROMENTAL Location manager: [employer]   Occupation: retired  Scientist, product/process development strain: Not on file   Food insecurity    Worry: Not on file    Inability: Not on Lexicographer needs    Medical: Not on file    Non-medical: Not on file  Tobacco Use   Smoking status: Former Smoker    Packs/day: 0.25    Years: 46.00    Pack years: 11.50    Types: Cigarettes    Start date: 03/16/1965    Quit date: 06/15/2010    Years since quitting: 8.4   Smokeless tobacco: Never Used   Tobacco comment: quit 3 years ago  Substance and Sexual Activity     Alcohol use: No    Alcohol/week: 0.0 standard drinks   Drug use: No   Sexual activity: Not on file  Lifestyle   Physical activity    Days per week: Not on file    Minutes per session: Not on file   Stress: Not on file  Relationships   Social connections    Talks on phone: Not on file    Gets together: Not on file    Attends religious service: Not on file    Active member of club or organization: Not on file    Attends meetings of clubs or organizations: Not on file    Relationship status: Not on file   Intimate partner violence    Fear of current or ex partner: Not on file    Emotionally abused: Not on file    Physically abused: Not on file    Forced sexual activity: Not on file  Other Topics Concern   Not on file  Social History Narrative   Has relocated from [place] in 2007. 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    FAMILY HISTORY: Family History  Problem Relation Age of Onset   Heart attack Father        Died, 63   Kidney disease Father    Parkinson's disease Mother        Died, 75   Breast cancer Sister        Living, 27   Diabetes type II Brother        Living, 23   Breast cancer Sister        Living, 54   Diabetes Daughter        Living, 40   Diabetes Son        Living, 59   Ovarian cancer Daughter    Colon cancer Neg Hx    Rectal cancer Neg Hx    Stomach cancer Neg Hx     ALLERGIES:  is allergic to lexapro [escitalopram oxalate].  MEDICATIONS:  Current Outpatient Medications  Medication Sig Dispense Refill   acyclovir (ZOVIRAX) 400 MG tablet Take 1 tablet (400 mg total) by mouth 2 (two) times daily. 60 tablet 11   aspirin EC 81 MG tablet Take 81 mg by mouth daily.     dexamethasone (DECADRON) 4 MG tablet 16m (5 tabs) with breakfast the day after each Daratumumab infusion 20 tablet 4   flecainide (TAMBOCOR) 50 MG tablet Take 75 mg by mouth daily as needed.     furosemide (LASIX) 40 MG tablet Take 40 mg by  mouth daily.     irbesartan (AVAPRO) 300 MG tablet Take 300 mg by mouth daily.  Melatonin 3 MG CAPS Take 3 mg by mouth at bedtime.     memantine (NAMENDA) 5 MG tablet Take 5 mg by mouth 2 (two) times daily.     metoprolol tartrate (LOPRESSOR) 25 MG tablet Take 1 tablet (25 mg total) by mouth 2 (two) times daily.     ondansetron (ZOFRAN) 8 MG tablet Take 1 tablet (8 mg total) by mouth 2 (two) times daily as needed (Nausea or vomiting). 30 tablet 1   oseltamivir (TAMIFLU) 75 MG capsule Take 1 capsule (75 mg total) by mouth 2 (two) times daily. 9 capsule 0   polyethylene glycol (MIRALAX / GLYCOLAX) packet Take 17 g by mouth daily. 14 each 0   prochlorperazine (COMPAZINE) 10 MG tablet Take 1 tablet (10 mg total) by mouth every 6 (six) hours as needed (Nausea or vomiting). 30 tablet 1   Saccharomyces boulardii (FLORASTOR PO) Take 250 mg by mouth 2 (two) times daily.     tamsulosin (FLOMAX) 0.4 MG CAPS capsule Take 2 capsules (0.8 mg total) by mouth daily. 90 capsule 0   traMADol (ULTRAM) 50 MG tablet Take 1 tablet( 50 mg total) by mouth every 8 hrs as needed for pain 90 tablet 2   vitamin B-12 (CYANOCOBALAMIN) 500 MCG tablet Take 1,000 mcg by mouth daily.     Vitamin D, Ergocalciferol, (DRISDOL) 50000 units CAPS capsule Take 50,000 Units by mouth every 7 (seven) days.     No current facility-administered medications for this visit.     REVIEW OF SYSTEMS:    A 10+ POINT REVIEW OF SYSTEMS WAS OBTAINED including neurology, dermatology, psychiatry, cardiac, respiratory, lymph, extremities, GI, GU, Musculoskeletal, constitutional, breasts, reproductive, HEENT.  All pertinent positives are noted in the HPI.  All others are negative.   PHYSICAL EXAMINATION: ECOG PERFORMANCE STATUS: 2 - Symptomatic, <50% confined to bed  VS stable  GENERAL:alert, in no acute distress and comfortable SKIN: no acute rashes, no significant lesions EYES: conjunctiva are pink and non-injected, sclera  anicteric OROPHARYNX: MMM, no exudates, no oropharyngeal erythema or ulceration NECK: supple, no JVD LYMPH:  no palpable lymphadenopathy in the cervical, axillary or inguinal regions LUNGS: clear to auscultation b/l with normal respiratory effort HEART: regular rate & rhythm ABDOMEN:  normoactive bowel sounds , non tender, not distended. No palpable hepatosplenomegaly.  Extremity: no pedal edema PSYCH: alert & oriented x 3 with fluent speech NEURO: no focal motor/sensory deficits   LABORATORY DATA:  I have reviewed the data as listed  . CBC Latest Ref Rng & Units 11/16/2018 10/19/2018 10/06/2018  WBC 4.0 - 10.5 K/uL 6.5 6.2 6.6  Hemoglobin 13.0 - 17.0 g/dL 12.5(L) 12.5(L) 12.8(L)  Hematocrit 39.0 - 52.0 % 39.6 39.9 40.2  Platelets 150 - 400 K/uL 150 132(L) 128(L)    . CMP Latest Ref Rng & Units 11/16/2018 10/19/2018 10/06/2018  Glucose 70 - 99 mg/dL 95 113(H) 89  BUN 8 - 23 mg/dL '15 14 17  ' Creatinine 0.61 - 1.24 mg/dL 1.11 1.16 1.01  Sodium 135 - 145 mmol/L 142 141 142  Potassium 3.5 - 5.1 mmol/L 4.1 4.0 4.2  Chloride 98 - 111 mmol/L 110 110 108  CO2 22 - 32 mmol/L '23 22 25  ' Calcium 8.9 - 10.3 mg/dL 8.3(L) 8.7(L) 8.9  Total Protein 6.5 - 8.1 g/dL 6.6 6.6 6.8  Total Bilirubin 0.3 - 1.2 mg/dL 0.6 0.6 0.7  Alkaline Phos 38 - 126 U/L 63 81 77  AST 15 - 41 U/L '16 17 17  ' ALT 0 -  44 U/L '12 11 15   ' 04/17/18 BM Bx:    RADIOGRAPHIC STUDIES: I have personally reviewed the radiological images as listed and agreed with the findings in the report. No results found.  ASSESSMENT & PLAN:  83 y.o. male with  1. Lytic bone lesions - w/u consistent with newly diagnosed multiple myeloma Labs upon initial presentation from 04/14/18; hypercalcemic with Calcium at 13.8, renal function up form baseline with Creatinine at 1.48, HGB at 11.3. PSA was normal.   03/31/18 CT C/A/P revealed 1. Lytic lesion throughout all visualized vertebra from the lower cervical spine through the sacrum. Epidural extension  of tumor T2, T3 and L2 level. Right L3-4 foraminal extension tumor. 2. Lytic lesions involving majority of ribs. Multiple pathologic rib fractures with bony expansion/sub pleural extension of tumor at several levels. 3. Lytic lesions of the scapula, hips bilaterally and pelvis bilaterally. With further progression of tumor, patient may be at risk for pathologic fractures. Pathologic fracture right clavicle incompletely assessed. 4. Circumferential narrowing sigmoid colon. Question possibility of underlying mass (series 2, image 97). Alternatively this could represent muscular hypertrophy/peristalsis. 5. Gastric antrum circumferential narrowing. Cannot exclude mass although this may be related to peristalsis. 6. No primary lung malignancy noted. 7. Matted aortic pulmonary window lymph nodes. 8. Circumferential urinary bladder wall thickening greater anterior dome region. Right lateral bladder diverticulum. 9. 5 mm low-density lesion pancreatic body (series 5, image 10) unchanged. This is felt unlikely to be a primary malignancy. 10. Right parapelvic 2.2 cm cyst without significant change. Scattered low-density renal lesions bilaterally too small to adequately characterize. Some were present previously. Statistically these are likely cysts. 11. Stable adrenal gland hyperplasia. 12. Gynecomastia. 13. Prostate gland causes slight impression upon the bladder base. 14.  Aortic Atherosclerosis. 15. Sequential pacemaker in place. 16. Gallstones.   04/23/18 PET/CT revealed Innumerable hypermetabolic lytic lesions throughout the skeleton especially involving the spine, ribs, bony pelvis along with some involvement of both proximal femurs. The appearance is compatible with pathologic diagnosis of plasma cell neoplasm could well reflect multiple myeloma. Resulting pathologic fractures of the right lateral clavicle and of multiple bilateral ribs. Lesions are present along the cortical margins of the spinal canal. 2. Both the  area and the stomach in the area in the sigmoid colon drawn attention to on prior CT scan appear benign normal today. 3. The confluence of AP window lymph nodes measures about 1.2 cm in short axis with maximum SUV 3.1, which is mildly above the blood pool merit surveillance. 4. Other imaging findings of potential clinical significance: Aortic Atherosclerosis. Coronary atherosclerosis. Pacemaker noted. Borderline prostatomegaly. Cholelithiasis.  04/21/18 BM Bx revealed Normocellular bone marrow with Plasma Cell neoplasm. Scattered medium sized clusters of kappa-restricted plasma cells (14% aspirate, 10% CD138 immunohistochemistry.   I do suspect that the burden of disease is underestimated in the bone marrow sample given PET/CT findings of innumerable lytic lesions and an M spike of 3.4g   06/08/18 DG Hips bilat which revealed Innumerable lucencies are noted throughout the pelvis and proximal femurs, similar findings noted on prior PET-CT of 04/23/2018. Findings are consistent patient's known multiple myeloma. Femoral necks are intact. 2.  Aortoiliac atherosclerotic vascular disease.  2. Pathologic right clavicle fracture due to myeloma 3. Hypercalcemia 03/25/18 CMP revealed Calcium at 13.8-- now down to 11.1 Will begin Xgeva injections, and strongly recommended keeping very well hydrated   PLAN:  -Discussed pt labwork today, 10/06/18; blood counts and chemistries are normal -11/16/18 MMP is pending. Last available 10/19/18 MMP  revealed M Protein stable at 0.2g -The pt has no prohibitive toxicities from continuing C8D1 Daratumumab at this time. -Will continue maintenance monthly Daratumumab until progression or intolerance -Discussed that due to the low IgG Kappa M spike, pt could now be in remission, as his M Protein has IgG kappa specificity, which is the same as the Daratumumab antibody. -No strong indication to add on additional medication like Revlimid at this time -Previously discussed the option  to pursue transplant consideration vs maintenance therapy after completing induction. The pt notes that he is very comfortable with not pursuing transplant, and is appreciating the current preservation of his quality of life without intrusive therapies. -Will recheck Vitamin D levels and will adjust replacement as indicated -CIDP has been considered in the treatment choice -Continue Xgeva every 4 weeks -Again advised that the pt limit his physical stressors, and limit bending, pushing, pulling and stairs to limit risk of further pathologic bone fractures -Recommended that the pt continue to eat well, drink at least 48-64 oz of water each day, and walk 20-30 minutes each day.  -Will see the pt back in 1 month   Please schedule next 3 doses of montly daratumumab with labs and MD visit   All of the patients questions were answered with apparent satisfaction. The patient knows to call the clinic with any problems, questions or concerns.  The total time spent in the appt was 20 minutes and more than 50% was on counseling and direct patient cares.     Sullivan Lone MD MS AAHIVMS [facility] Hematology/Oncology Physician [facility]  (Office):       [phone] (Work cell):  [phone] (Fax):           [phone]  11/16/2018 11:23 AM  I, Baldwin [place], am acting as a scribe for Dr. Sullivan Lone.   .I have reviewed the above documentation for accuracy and completeness, and I agree with the above. Brunetta Genera MD

## 2018-11-16 ENCOUNTER — Inpatient Hospital Stay: Payer: Medicare Other | Attending: Hematology

## 2018-11-16 ENCOUNTER — Other Ambulatory Visit: Payer: Self-pay

## 2018-11-16 ENCOUNTER — Inpatient Hospital Stay: Payer: Medicare Other

## 2018-11-16 ENCOUNTER — Telehealth: Payer: Self-pay | Admitting: Hematology

## 2018-11-16 ENCOUNTER — Inpatient Hospital Stay (HOSPITAL_BASED_OUTPATIENT_CLINIC_OR_DEPARTMENT_OTHER): Payer: Medicare Other | Admitting: Hematology

## 2018-11-16 VITALS — BP 157/86 | HR 68 | Temp 98.4°F | Resp 18

## 2018-11-16 VITALS — BP 161/81 | HR 65 | Temp 99.1°F | Resp 18 | Ht 71.0 in | Wt 206.9 lb

## 2018-11-16 DIAGNOSIS — I700 Atherosclerosis of aorta: Secondary | ICD-10-CM

## 2018-11-16 DIAGNOSIS — M545 Low back pain: Secondary | ICD-10-CM | POA: Diagnosis not present

## 2018-11-16 DIAGNOSIS — Z950 Presence of cardiac pacemaker: Secondary | ICD-10-CM

## 2018-11-16 DIAGNOSIS — I5032 Chronic diastolic (congestive) heart failure: Secondary | ICD-10-CM

## 2018-11-16 DIAGNOSIS — Z79899 Other long term (current) drug therapy: Secondary | ICD-10-CM

## 2018-11-16 DIAGNOSIS — G6181 Chronic inflammatory demyelinating polyneuritis: Secondary | ICD-10-CM | POA: Insufficient documentation

## 2018-11-16 DIAGNOSIS — Z7982 Long term (current) use of aspirin: Secondary | ICD-10-CM

## 2018-11-16 DIAGNOSIS — Z87891 Personal history of nicotine dependence: Secondary | ICD-10-CM | POA: Insufficient documentation

## 2018-11-16 DIAGNOSIS — I2510 Atherosclerotic heart disease of native coronary artery without angina pectoris: Secondary | ICD-10-CM | POA: Diagnosis not present

## 2018-11-16 DIAGNOSIS — C7951 Secondary malignant neoplasm of bone: Secondary | ICD-10-CM | POA: Diagnosis not present

## 2018-11-16 DIAGNOSIS — C9000 Multiple myeloma not having achieved remission: Secondary | ICD-10-CM | POA: Diagnosis not present

## 2018-11-16 DIAGNOSIS — Z5112 Encounter for antineoplastic immunotherapy: Secondary | ICD-10-CM | POA: Insufficient documentation

## 2018-11-16 DIAGNOSIS — N400 Enlarged prostate without lower urinary tract symptoms: Secondary | ICD-10-CM | POA: Insufficient documentation

## 2018-11-16 DIAGNOSIS — Z7189 Other specified counseling: Secondary | ICD-10-CM

## 2018-11-16 LAB — CBC WITH DIFFERENTIAL/PLATELET
Abs Immature Granulocytes: 0.01 10*3/uL (ref 0.00–0.07)
Basophils Absolute: 0.1 10*3/uL (ref 0.0–0.1)
Basophils Relative: 1 %
Eosinophils Absolute: 0.5 10*3/uL (ref 0.0–0.5)
Eosinophils Relative: 7 %
HCT: 39.6 % (ref 39.0–52.0)
Hemoglobin: 12.5 g/dL — ABNORMAL LOW (ref 13.0–17.0)
Immature Granulocytes: 0 %
Lymphocytes Relative: 48 %
Lymphs Abs: 3.1 10*3/uL (ref 0.7–4.0)
MCH: 27.3 pg (ref 26.0–34.0)
MCHC: 31.6 g/dL (ref 30.0–36.0)
MCV: 86.5 fL (ref 80.0–100.0)
Monocytes Absolute: 0.5 10*3/uL (ref 0.1–1.0)
Monocytes Relative: 8 %
Neutro Abs: 2.3 10*3/uL (ref 1.7–7.7)
Neutrophils Relative %: 36 %
Platelets: 150 10*3/uL (ref 150–400)
RBC: 4.58 MIL/uL (ref 4.22–5.81)
RDW: 14.5 % (ref 11.5–15.5)
WBC: 6.5 10*3/uL (ref 4.0–10.5)
nRBC: 0 % (ref 0.0–0.2)

## 2018-11-16 LAB — CMP (CANCER CENTER ONLY)
ALT: 12 U/L (ref 0–44)
AST: 16 U/L (ref 15–41)
Albumin: 3.6 g/dL (ref 3.5–5.0)
Alkaline Phosphatase: 63 U/L (ref 38–126)
Anion gap: 9 (ref 5–15)
BUN: 15 mg/dL (ref 8–23)
CO2: 23 mmol/L (ref 22–32)
Calcium: 8.3 mg/dL — ABNORMAL LOW (ref 8.9–10.3)
Chloride: 110 mmol/L (ref 98–111)
Creatinine: 1.11 mg/dL (ref 0.61–1.24)
GFR, Est AFR Am: >60 mL/min (ref >60)
GFR, Estimated: >60 mL/min (ref >60)
Glucose, Bld: 95 mg/dL (ref 70–99)
Potassium: 4.1 mmol/L (ref 3.5–5.1)
Sodium: 142 mmol/L (ref 135–145)
Total Bilirubin: 0.6 mg/dL (ref 0.3–1.2)
Total Protein: 6.6 g/dL (ref 6.5–8.1)

## 2018-11-16 MED ORDER — SODIUM CHLORIDE 0.9 % IV SOLN
20.0000 mg | Freq: Once | INTRAVENOUS | Status: AC
  Administered 2018-11-16: 20 mg via INTRAVENOUS
  Filled 2018-11-16: qty 2

## 2018-11-16 MED ORDER — DIPHENHYDRAMINE HCL 25 MG PO CAPS
ORAL_CAPSULE | ORAL | Status: AC
  Filled 2018-11-16: qty 1

## 2018-11-16 MED ORDER — FAMOTIDINE IN NACL 20-0.9 MG/50ML-% IV SOLN
INTRAVENOUS | Status: AC
  Filled 2018-11-16: qty 50

## 2018-11-16 MED ORDER — ACETAMINOPHEN 325 MG PO TABS
ORAL_TABLET | ORAL | Status: AC
  Filled 2018-11-16: qty 2

## 2018-11-16 MED ORDER — SODIUM CHLORIDE 0.9 % IV SOLN
Freq: Once | INTRAVENOUS | Status: AC
  Administered 2018-11-16: 12:00:00 via INTRAVENOUS
  Filled 2018-11-16: qty 250

## 2018-11-16 MED ORDER — MONTELUKAST SODIUM 10 MG PO TABS
ORAL_TABLET | ORAL | Status: AC
  Filled 2018-11-16: qty 1

## 2018-11-16 MED ORDER — DIPHENHYDRAMINE HCL 25 MG PO CAPS
50.0000 mg | ORAL_CAPSULE | Freq: Once | ORAL | Status: AC
  Administered 2018-11-16: 50 mg via ORAL

## 2018-11-16 MED ORDER — SODIUM CHLORIDE 0.9 % IV SOLN
16.2000 mg/kg | Freq: Once | INTRAVENOUS | Status: AC
  Administered 2018-11-16: 1400 mg via INTRAVENOUS
  Filled 2018-11-16: qty 60

## 2018-11-16 MED ORDER — FAMOTIDINE IN NACL 20-0.9 MG/50ML-% IV SOLN
20.0000 mg | Freq: Once | INTRAVENOUS | Status: AC
  Administered 2018-11-16: 20 mg via INTRAVENOUS

## 2018-11-16 MED ORDER — MONTELUKAST SODIUM 10 MG PO TABS
10.0000 mg | ORAL_TABLET | Freq: Once | ORAL | Status: AC
  Administered 2018-11-16: 10 mg via ORAL

## 2018-11-16 MED ORDER — ACETAMINOPHEN 325 MG PO TABS
650.0000 mg | ORAL_TABLET | Freq: Once | ORAL | Status: AC
  Administered 2018-11-16: 650 mg via ORAL

## 2018-11-16 NOTE — Patient Instructions (Signed)
[facility] Discharge Instructions for Patients Receiving Chemotherapy  Today you received the following chemotherapy agents:  Darzalex  To help prevent nausea and vomiting after your treatment, we encourage you to take your nausea medication as prescribed.   If you develop nausea and vomiting that is not controlled by your nausea medication, call the clinic.   BELOW ARE SYMPTOMS THAT SHOULD BE REPORTED IMMEDIATELY:  *FEVER GREATER THAN 100.5 F  *CHILLS WITH OR WITHOUT FEVER  NAUSEA AND VOMITING THAT IS NOT CONTROLLED WITH YOUR NAUSEA MEDICATION  *UNUSUAL SHORTNESS OF BREATH  *UNUSUAL BRUISING OR BLEEDING  TENDERNESS IN MOUTH AND THROAT WITH OR WITHOUT PRESENCE OF ULCERS  *URINARY PROBLEMS  *BOWEL PROBLEMS  UNUSUAL RASH Items with * indicate a potential emergency and should be followed up as soon as possible.  Feel free to call the clinic should you have any questions or concerns. The clinic phone number is [phone].  Please show the CHEMO ALERT CARD at check-in to the Emergency Department and triage nurse.   

## 2018-11-16 NOTE — Telephone Encounter (Signed)
Scheduled appts per 7/6 los. Spoke with patient daughter and she is aware of the appt date and time.

## 2018-11-18 LAB — MULTIPLE MYELOMA PANEL, SERUM
Albumin SerPl Elph-Mcnc: 3.4 g/dL (ref 2.9–4.4)
Albumin/Glob SerPl: 1.4 (ref 0.7–1.7)
Alpha 1: 0.2 g/dL (ref 0.0–0.4)
Alpha2 Glob SerPl Elph-Mcnc: 0.9 g/dL (ref 0.4–1.0)
B-Globulin SerPl Elph-Mcnc: 1 g/dL (ref 0.7–1.3)
Gamma Glob SerPl Elph-Mcnc: 0.5 g/dL (ref 0.4–1.8)
Globulin, Total: 2.6 g/dL (ref 2.2–3.9)
IgA: 158 mg/dL (ref 61–437)
IgG (Immunoglobin G), Serum: 657 mg/dL (ref 603–1613)
IgM (Immunoglobulin M), Srm: 53 mg/dL (ref 15–143)
M Protein SerPl Elph-Mcnc: 0.2 g/dL — ABNORMAL HIGH
Total Protein ELP: 6 g/dL (ref 6.0–8.5)

## 2018-11-30 ENCOUNTER — Other Ambulatory Visit: Payer: Self-pay

## 2018-11-30 ENCOUNTER — Inpatient Hospital Stay: Payer: Medicare Other

## 2018-11-30 VITALS — BP 167/84 | HR 66 | Temp 98.9°F | Resp 18

## 2018-11-30 DIAGNOSIS — G6181 Chronic inflammatory demyelinating polyneuritis: Secondary | ICD-10-CM | POA: Diagnosis not present

## 2018-11-30 DIAGNOSIS — C9000 Multiple myeloma not having achieved remission: Secondary | ICD-10-CM

## 2018-11-30 DIAGNOSIS — C7951 Secondary malignant neoplasm of bone: Secondary | ICD-10-CM | POA: Diagnosis not present

## 2018-11-30 DIAGNOSIS — Z5112 Encounter for antineoplastic immunotherapy: Secondary | ICD-10-CM | POA: Diagnosis not present

## 2018-11-30 DIAGNOSIS — Z7189 Other specified counseling: Secondary | ICD-10-CM

## 2018-11-30 DIAGNOSIS — M545 Low back pain: Secondary | ICD-10-CM | POA: Diagnosis not present

## 2018-11-30 MED ORDER — DENOSUMAB 120 MG/1.7ML ~~LOC~~ SOLN
120.0000 mg | Freq: Once | SUBCUTANEOUS | Status: AC
  Administered 2018-11-30: 120 mg via SUBCUTANEOUS

## 2018-11-30 MED ORDER — DENOSUMAB 120 MG/1.7ML ~~LOC~~ SOLN
SUBCUTANEOUS | Status: AC
  Filled 2018-11-30: qty 1.7

## 2018-11-30 NOTE — Progress Notes (Signed)
Verbal Order per Dr. Irene Limbo: Madaline Brilliant to give [place] today.

## 2018-11-30 NOTE — Patient Instructions (Signed)
Denosumab injection What is this medicine? DENOSUMAB (den oh sue mab) slows bone breakdown. Prolia is used to treat osteoporosis in women after menopause and in men, and in people who are taking corticosteroids for 6 months or more. Xgeva is used to treat a high calcium level due to cancer and to prevent bone fractures and other bone problems caused by multiple myeloma or cancer bone metastases. Xgeva is also used to treat giant cell tumor of the bone. This medicine may be used for other purposes; ask your health care provider or pharmacist if you have questions. COMMON BRAND NAME(S): Prolia, XGEVA What should I tell my health care provider before I take this medicine? They need to know if you have any of these conditions:  dental disease  having surgery or tooth extraction  infection  kidney disease  low levels of calcium or Vitamin D in the blood  malnutrition  on hemodialysis  skin conditions or sensitivity  thyroid or parathyroid disease  an unusual reaction to denosumab, other medicines, foods, dyes, or preservatives  pregnant or trying to get pregnant  breast-feeding How should I use this medicine? This medicine is for injection under the skin. It is given by a health care professional in a hospital or clinic setting. A special MedGuide will be given to you before each treatment. Be sure to read this information carefully each time. For Prolia, talk to your pediatrician regarding the use of this medicine in children. Special care may be needed. For Xgeva, talk to your pediatrician regarding the use of this medicine in children. While this drug may be prescribed for children as young as 13 years for selected conditions, precautions do apply. Overdosage: If you think you have taken too much of this medicine contact a poison control center or emergency room at once. NOTE: This medicine is only for you. Do not share this medicine with others. What if I miss a dose? It is  important not to miss your dose. Call your doctor or health care professional if you are unable to keep an appointment. What may interact with this medicine? Do not take this medicine with any of the following medications:  other medicines containing denosumab This medicine may also interact with the following medications:  medicines that lower your chance of fighting infection  steroid medicines like prednisone or cortisone This list may not describe all possible interactions. Give your health care provider a list of all the medicines, herbs, non-prescription drugs, or dietary supplements you use. Also tell them if you smoke, drink alcohol, or use illegal drugs. Some items may interact with your medicine. What should I watch for while using this medicine? Visit your doctor or health care professional for regular checks on your progress. Your doctor or health care professional may order blood tests and other tests to see how you are doing. Call your doctor or health care professional for advice if you get a fever, chills or sore throat, or other symptoms of a cold or flu. Do not treat yourself. This drug may decrease your body's ability to fight infection. Try to avoid being around people who are sick. You should make sure you get enough calcium and vitamin D while you are taking this medicine, unless your doctor tells you not to. Discuss the foods you eat and the vitamins you take with your health care professional. See your dentist regularly. Brush and floss your teeth as directed. Before you have any dental work done, tell your dentist you are   receiving this medicine. Do not become pregnant while taking this medicine or for 5 months after stopping it. Talk with your doctor or health care professional about your birth control options while taking this medicine. Women should inform their doctor if they wish to become pregnant or think they might be pregnant. There is a potential for serious side  effects to an unborn child. Talk to your health care professional or pharmacist for more information. What side effects may I notice from receiving this medicine? Side effects that you should report to your doctor or health care professional as soon as possible:  allergic reactions like skin rash, itching or hives, swelling of the face, lips, or tongue  bone pain  breathing problems  dizziness  jaw pain, especially after dental work  redness, blistering, peeling of the skin  signs and symptoms of infection like fever or chills; cough; sore throat; pain or trouble passing urine  signs of low calcium like fast heartbeat, muscle cramps or muscle pain; pain, tingling, numbness in the hands or feet; seizures  unusual bleeding or bruising  unusually weak or tired Side effects that usually do not require medical attention (report to your doctor or health care professional if they continue or are bothersome):  constipation  diarrhea  headache  joint pain  loss of appetite  muscle pain  runny nose  tiredness  upset stomach This list may not describe all possible side effects. Call your doctor for medical advice about side effects. You may report side effects to FDA at 1-800-FDA-1088. Where should I keep my medicine? This medicine is only given in a clinic, doctor's office, or other health care setting and will not be stored at home. NOTE: This sheet is a summary. It may not cover all possible information. If you have questions about this medicine, talk to your doctor, pharmacist, or health care provider.  2020 [employer] (2017-09-05 16:10:44)

## 2018-12-03 ENCOUNTER — Encounter: Payer: Self-pay | Admitting: Neurology

## 2018-12-07 ENCOUNTER — Other Ambulatory Visit: Payer: Self-pay

## 2018-12-07 ENCOUNTER — Telehealth (INDEPENDENT_AMBULATORY_CARE_PROVIDER_SITE_OTHER): Payer: Medicare Other | Admitting: Neurology

## 2018-12-07 DIAGNOSIS — G6181 Chronic inflammatory demyelinating polyneuritis: Secondary | ICD-10-CM | POA: Diagnosis not present

## 2018-12-07 NOTE — Progress Notes (Signed)
Virtual Visit via Video Note The purpose of this virtual visit is to provide medical care while limiting exposure to the novel coronavirus.    Consent was obtained for video visit:  Yes.   Answered questions that patient had about telehealth interaction:  Yes.   I discussed the limitations, risks, security and privacy concerns of performing an evaluation and management service by telemedicine. I also discussed with the patient that there may be a patient responsible charge related to this service. The patient expressed understanding and agreed to proceed.  Pt location: Home Physician Location: office Name of referring provider:  [name], MD I connected with [name] at patients initiation/request on 12/07/2018 at  2:50 PM EDT by video enabled telemedicine application and verified that I am speaking with the correct person using two identifiers. Pt MRN:  [id number] Pt DOB:  [birth year] Video Participants:  [name];  [name] (daughter)   History of Present Illness: This is a 83 y.o. male with  history of BPH, hypertension, bradycardia s/p pacemaker, and previous tobacco use presenting for follow-up of CIDP.   He was last seen in the office in October 2019 and had transition to [place] home, where he continues to live.  He has made significant improvement in overall health, with increased weight, appetite, and mood.  He is regularly exercising and feels that leg strength is also better.  He walks with a Rollator and is careful with balance, however has not sustained any falls. He is able to perform all ADLs.  They have allowed him to start his own vegetable garden in the small area, which gives him purpose and enjoyment.  He denies any new numbness, tingling, or weakness.  He has not been on any therapy for CIDP over the last year or so, but reports to doing well.  He was diagnosed with multiple myeloma in late 2019 after imaging showed multiple bony lytic lesions.  He is  being followed by Dr. Irene Limbo and on daratumumab.    Observations/Objective:   Weight:  203lb Patient is awake, alert, and appears comfortable.  Oriented x 4.   Extraocular muscles are intact. No ptosis.  Face is symmetric.  Speech is not dysarthric. Tongue is midline. Antigravity in all extremities, with assistance of his daughter, motor strength proximally in the arms and legs was 5/5 (improved).  He continues to have atrophy in the intrinsic hand muscles, which is stable.  No pronator drift.  He is easily able to stand up from low-chair without using arms.    Assessment and Plan:  1.  Chronic inflammatory demyelinating polyradiculoneuropathy.  Overall, he is doing well and possibly even better than his last visit in October 2019.  His neuropathy may indeed be related to new diagnosis of multiple myeloma.  He is no longer on corticosteroids or IVIG and has been doing well.  I will continue to follow him clinically.  He was recommended to continue exercises at home and take extra precautions when walking, especially on uneven ground.  He is compliant with using a Rollator.  2.  Mild cognitive changes, resolved -most likely due to vitamin deficiency.  Formal neuropsychological testing in January 2019 did not show signs of dementia.   Follow Up Instructions:   I discussed the assessment and treatment plan with the patient. The patient was provided an opportunity to ask questions and all were answered. The patient agreed with the plan and demonstrated an understanding of the instructions.  The patient was advised to call back or seek an in-person evaluation if the symptoms worsen or if the condition fails to improve as anticipated.  Follow-up in 9 months  Total time spent:  20 minutes     [name], DO

## 2018-12-13 NOTE — Progress Notes (Signed)
HEMATOLOGY/ONCOLOGY CLINIC NOTE  Date of Service: 12/14/2018  Patient Care Team: [name], MD as PCP - General (Family Medicine) [name], MD (Cardiology) [name], MD (Cardiology) [name], DO as Consulting Physician (Neurology)  CHIEF COMPLAINTS/PURPOSE OF CONSULTATION:  Continue mx of myeloma  HISTORY OF PRESENTING ILLNESS:   Jonathon [name] is a wonderful 83 y.o. male who has been referred to [place] by Dr. [name] for evaluation and management of Lytic bone lesions. He is accompanied today by his daughter. The pt reports that he is doing well overall. He is currently a resident at [facility] in [place], [place].   The pt presented to the ED on 03/24/18 with a right distal clavicle fracture, which occurred after trying to get out of bed and pushing himself up with his right arm. He notes that his energy has been noticeably decreased for the last month. He also notes that his lower back has a slight sting when trying to stand up from sitting. He also notes some pain in his lower, right chest wall, which is exacerbated when coughing. The pt has been taking 82m Tramadol which is controlling his pain. He does endorse some weakening of both of his lower extremities in the past 1-2 months, which he attributes somewhat to his recent lower back pain.   The pt denies any bowel abnormalities recently. He notes that he stays very well hydrated, and urinates 4-5 times a night, and does have BPH, and takes Flomax. The pt denies any recent changes in his urination habits.   The pt notes that his appetite decreased for a few months, until he began assisted living. He has lost 9 pounds in the last week.   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 He now uses a walker to ambulate. He notes that the CIDP symptoms are primarily located to his legs. He  sees Dr. [name] Neurology. He took steroids without any symptomatic relief. IVIG was cost-prohibitive for the patient, but was recommended.    Regarding the patient's anemia in April 2019, he denies any obvious bleeding at that time, and denies surgeries.    The pt notes that his last colonoscopy, in 2015, was not concerning, which is corroborated by chart review.   The pt and daughter deny any concerns for memory issues, and the daughter adds his memory is "elephant-like". They are not sure why Namenda is on his medication list.   The pt denies any recent dental procedures, or concerns for dental issues. He has maintained every 6 month dental cleanings for all of his life.   Of note prior to the patient's visit today, pt has had a CT C/A/P completed on 03/31/18 with results revealing IMPRESSION: 1. Lytic lesion throughout all visualized vertebra from the lower cervical spine through the sacrum. Epidural extension of tumor T2, T3 and L2 level. Right L3-4 foraminal extension tumor. 2. Lytic lesions involving majority of ribs. Multiple pathologic rib fractures with bony expansion/sub pleural extension of tumor at several levels. 3. Lytic lesions of the scapula, hips bilaterally and pelvis bilaterally. With further progression of tumor, patient may be at risk for pathologic fractures. Pathologic fracture right clavicle incompletely assessed. 4. Circumferential narrowing sigmoid colon. Question possibility of underlying mass (series 2, image 97). Alternatively this could represent muscular hypertrophy/peristalsis. 5. Gastric  antrum circumferential narrowing. Cannot exclude mass although this may be related to peristalsis. 6. No primary lung malignancy noted. 7. Matted aortic pulmonary window lymph nodes. 8. Circumferential urinary bladder wall thickening greater anterior dome region. Right lateral bladder diverticulum. 9. 5 mm low-density lesion pancreatic body (series 5, image 10) unchanged.  This is felt unlikely to be a primary malignancy. 10. Right parapelvic 2.2 cm cyst without significant change. Scattered low-density renal lesions bila2terally too small to adequately characterize. Some were present previously. Statistically these are likely cysts. 11. Stable adrenal gland hyperplasia. 12. Gynecomastia. 13. Prostate gland causes slight impression upon the bladder base. 14.  Aortic Atherosclerosis. 15. Sequential pacemaker in place. 16. Gallstones.  Most recent lab results (03/25/18) of CBC w/diff and CMP is as follows: all values are WNL except for RBC at 4.14, HGB at 11.3, HCT at 33.6, Chloride at 94, CO2 at 33, Glucose at 100, BUN at 25, Total Protein at 9.4, Albumin at 3.4, Calcium at 13.8, GFR at 58. 03/25/18 PSA, Total was normal at 1.2  On review of systems, pt reports positional lower back pain, lower right chest wall pain, recently decreased energy levels, relatively weaker appetite, weight loss, right clavicle pain, weakening of the lower extremities, moving his bowels well, and denies changes in urination, abdominal pains, changes in breathing, changes in bowel habits, pain along the spine, leg swelling, and any other symptoms.  Interval History:   Jonathon [name] returns today for management, evaluation, and C9D1 Daratumumab treatment of his Multiple Myeloma. The patient's last visit with [place] was on 11/16/2018. The pt reports that he is doing well overall.  The pt reports no new concerns. He has been eating well and biking for 40 min daily for physical activity. His leg swelling has improved and he has not needed to wear his compression socks everyday. The pt has had no problems with his treatment.  Lab results today (12/14/2018) of CBC w/diff and CMP is as follows: all values are WNL except for HGB at 12.4 PLt at 144k. 12/14/2018 MMP shows no M spike  On review of systems, pt reports improved leg swelling and denies bone pains, body aches, fevers, infection concerns, cough,  sore throat, belly pain, and any other symptoms.   MEDICAL HISTORY:  Past Medical History:  Diagnosis Date   Asthma    BENIGN PROSTATIC HYPERTROPHY, HX OF 10/25/2008   Bone metastases ([place]) 04/14/2018   BRADYCARDIA 2005   Chronic diastolic congestive heart failure ([place]) 06/05/2016   CIDP (chronic inflammatory demyelinating polyneuropathy) ([place])    HYPERTENSION 11/21/2006   Iron deficiency anemia, unspecified 04/19/2013   Multiple myeloma ([place]) 04/29/2018   NEPHROLITHIASIS, HX OF 11/21/2006 and 10/15/2008   PACEMAKER, PERMANENT 2005   Gen change 2014 Medtronic Adaptic L dual-chamber pacemaker, serial #[id number]    SVT (supraventricular tachycardia) ([place])    TOBACCO ABUSE 10/25/2008   Quit 2012    SURGICAL HISTORY: Past Surgical History:  Procedure Laterality Date   COLONOSCOPY  2004,2009   PACEMAKER INSERTION  2005   PERMANENT PACEMAKER GENERATOR CHANGE N/A 06/19/2012   Procedure: PERMANENT PACEMAKER GENERATOR CHANGE;  Surgeon: [name], MD; Medtronic Adaptic L dual-chamber pacemaker, serial #[id number]     SKIN GRAFT Right 1962   wrist    SOCIAL HISTORY: Social History   Socioeconomic History   Marital status: Divorced    Spouse name: Not on file   Number of children: 4   Years of education: Not on file   Highest education level:  Not on file  Occupational History   Occupation: ENVIROMENTAL SERVICE    Employer: [employer]   Occupation: retired  Scientist, product/process development strain: Not on file   Food insecurity    Worry: Not on file    Inability: Not on Lexicographer needs    Medical: Not on file    Non-medical: Not on file  Tobacco Use   Smoking status: Former Smoker    Packs/day: 0.25    Years: 46.00    Pack years: 11.50    Types: Cigarettes    Start date: 03/16/1965    Quit date: 06/15/2010    Years since quitting: 8.5   Smokeless tobacco: Never Used   Tobacco comment: quit 3 years ago  Substance and  Sexual Activity   Alcohol use: No    Alcohol/week: 0.0 standard drinks   Drug use: No   Sexual activity: Not on file  Lifestyle   Physical activity    Days per week: Not on file    Minutes per session: Not on file   Stress: Not on file  Relationships   Social connections    Talks on phone: Not on file    Gets together: Not on file    Attends religious service: Not on file    Active member of club or organization: Not on file    Attends meetings of clubs or organizations: Not on file    Relationship status: Not on file   Intimate partner violence    Fear of current or ex partner: Not on file    Emotionally abused: Not on file    Physically abused: Not on file    Forced sexual activity: Not on file  Other Topics Concern   Not on file  Social History Narrative   Has relocated from [place] in 2007. 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  Patient in a facility thru [facility]    FAMILY HISTORY: Family History  Problem Relation Age of Onset   Heart attack Father        Died, 58   Kidney disease Father    Parkinson's disease Mother        Died, 43   Breast cancer Sister        Living, 37   Diabetes type II Brother        Living, 55   Breast cancer Sister        Living, 48   Diabetes Daughter        Living, 79   Diabetes Son        Living, 81   Ovarian cancer Daughter    Colon cancer Neg Hx    Rectal cancer Neg Hx    Stomach cancer Neg Hx     ALLERGIES:  is allergic to lexapro [escitalopram oxalate].  MEDICATIONS:  Current Outpatient Medications  Medication Sig Dispense Refill   acyclovir (ZOVIRAX) 400 MG tablet Take 1 tablet (400 mg total) by mouth 2 (two) times daily. 60 tablet 11   aspirin EC 81 MG tablet Take 81 mg by mouth daily.     dexamethasone (DECADRON) 4 MG tablet 70m (5 tabs) with breakfast the day after each Daratumumab infusion 20 tablet 4   flecainide (TAMBOCOR) 50 MG tablet Take 75 mg by mouth daily as needed.     furosemide (LASIX) 40 MG tablet  Take 40 mg by mouth daily.     irbesartan (AVAPRO) 300 MG tablet Take 300 mg by mouth daily.  Melatonin 3 MG CAPS Take 3 mg by mouth at bedtime.     memantine (NAMENDA) 5 MG tablet Take 5 mg by mouth 2 (two) times daily.     metoprolol tartrate (LOPRESSOR) 25 MG tablet Take 1 tablet (25 mg total) by mouth 2 (two) times daily.     ondansetron (ZOFRAN) 8 MG tablet Take 1 tablet (8 mg total) by mouth 2 (two) times daily as needed (Nausea or vomiting). 30 tablet 1   oseltamivir (TAMIFLU) 75 MG capsule Take 1 capsule (75 mg total) by mouth 2 (two) times daily. 9 capsule 0   polyethylene glycol (MIRALAX / GLYCOLAX) packet Take 17 g by mouth daily. 14 each 0   prochlorperazine (COMPAZINE) 10 MG tablet Take 1 tablet (10 mg total) by mouth every 6 (six) hours as needed (Nausea or vomiting). 30 tablet 1   Saccharomyces boulardii (FLORASTOR PO) Take 250 mg by mouth 2 (two) times daily.     tamsulosin (FLOMAX) 0.4 MG CAPS capsule Take 2 capsules (0.8 mg total) by mouth daily. 90 capsule 0   traMADol (ULTRAM) 50 MG tablet Take 1 tablet( 50 mg total) by mouth every 8 hrs as needed for pain 90 tablet 2   vitamin B-12 (CYANOCOBALAMIN) 500 MCG tablet Take 1,000 mcg by mouth daily.     Vitamin D, Ergocalciferol, (DRISDOL) 50000 units CAPS capsule Take 50,000 Units by mouth every 7 (seven) days.     No current facility-administered medications for this visit.     REVIEW OF SYSTEMS:    A 10+ POINT REVIEW OF SYSTEMS WAS OBTAINED including neurology, dermatology, psychiatry, cardiac, respiratory, lymph, extremities, GI, GU, Musculoskeletal, constitutional, breasts, reproductive, HEENT.  All pertinent positives are noted in the HPI.  All others are negative.   PHYSICAL EXAMINATION: ECOG PERFORMANCE STATUS: 2 - Symptomatic, <50% confined to bed  GENERAL:alert, in no acute distress and comfortable SKIN: no acute rashes, no significant lesions EYES: conjunctiva are pink and non-injected, sclera  anicteric OROPHARYNX: MMM, no exudates, no oropharyngeal erythema or ulceration NECK: supple, no JVD LYMPH:  no palpable lymphadenopathy in the cervical, axillary or inguinal regions LUNGS: clear to auscultation b/l with normal respiratory effort HEART: regular rate & rhythm ABDOMEN:  normoactive bowel sounds , non tender, not distended. No palpable hepatosplenomegaly.  Extremity: no pedal edema PSYCH: alert & oriented x 3 with fluent speech NEURO: no focal motor/sensory deficits   LABORATORY DATA:  I have reviewed the data as listed  . CBC Latest Ref Rng & Units 12/14/2018 11/16/2018 10/19/2018  WBC 4.0 - 10.5 K/uL 6.3 6.5 6.2  Hemoglobin 13.0 - 17.0 g/dL 12.4(L) 12.5(L) 12.5(L)  Hematocrit 39.0 - 52.0 % 39.3 39.6 39.9  Platelets 150 - 400 K/uL 144(L) 150 132(L)    . CMP Latest Ref Rng & Units 12/14/2018 11/16/2018 10/19/2018  Glucose 70 - 99 mg/dL 99 95 113(H)  BUN 8 - 23 mg/dL '18 15 14  ' Creatinine 0.61 - 1.24 mg/dL 1.11 1.11 1.16  Sodium 135 - 145 mmol/L 142 142 141  Potassium 3.5 - 5.1 mmol/L 4.0 4.1 4.0  Chloride 98 - 111 mmol/L 109 110 110  CO2 22 - 32 mmol/L '23 23 22  ' Calcium 8.9 - 10.3 mg/dL 9.2 8.3(L) 8.7(L)  Total Protein 6.5 - 8.1 g/dL 6.7 6.6 6.6  Total Bilirubin 0.3 - 1.2 mg/dL 0.8 0.6 0.6  Alkaline Phos 38 - 126 U/L 69 63 81  AST 15 - 41 U/L '17 16 17  ' ALT 0 - 44 U/L 13  12 11   04/17/18 BM Bx:   Most recent MMP from 11/16/2018 Component     Latest Ref Rng & Units 11/16/2018  IgG (Immunoglobin G), Serum     603 - 1,613 mg/dL 657  IgA     61 - 437 mg/dL 158  IgM (Immunoglobulin M), Srm     15 - 143 mg/dL 53  Total Protein ELP     6.0 - 8.5 g/dL 6.0  Albumin SerPl Elph-Mcnc     2.9 - 4.4 g/dL 3.4  Alpha 1     0.0 - 0.4 g/dL 0.2  Alpha2 Glob SerPl Elph-Mcnc     0.4 - 1.0 g/dL 0.9  B-Globulin SerPl Elph-Mcnc     0.7 - 1.3 g/dL 1.0  Gamma Glob SerPl Elph-Mcnc     0.4 - 1.8 g/dL 0.5  M Protein SerPl Elph-Mcnc     Not Observed g/dL 0.2 (H)  Globulin, Total      2.2 - 3.9 g/dL 2.6  Albumin/Glob SerPl     0.7 - 1.7 1.4  IFE 1      Comment  Please Note (HCV):      Comment    RADIOGRAPHIC STUDIES: I have personally reviewed the radiological images as listed and agreed with the findings in the report. No results found.  ASSESSMENT & PLAN:  83 y.o. male with  1. Lytic bone lesions - w/u consistent with newly diagnosed multiple myeloma Labs upon initial presentation from 04/14/18; hypercalcemic with Calcium at 13.8, renal function up form baseline with Creatinine at 1.48, HGB at 11.3. PSA was normal.   03/31/18 CT C/A/P revealed 1. Lytic lesion throughout all visualized vertebra from the lower cervical spine through the sacrum. Epidural extension of tumor T2, T3 and L2 level. Right L3-4 foraminal extension tumor. 2. Lytic lesions involving majority of ribs. Multiple pathologic rib fractures with bony expansion/sub pleural extension of tumor at several levels. 3. Lytic lesions of the scapula, hips bilaterally and pelvis bilaterally. With further progression of tumor, patient may be at risk for pathologic fractures. Pathologic fracture right clavicle incompletely assessed. 4. Circumferential narrowing sigmoid colon. Question possibility of underlying mass (series 2, image 97). Alternatively this could represent muscular hypertrophy/peristalsis. 5. Gastric antrum circumferential narrowing. Cannot exclude mass although this may be related to peristalsis. 6. No primary lung malignancy noted. 7. Matted aortic pulmonary window lymph nodes. 8. Circumferential urinary bladder wall thickening greater anterior dome region. Right lateral bladder diverticulum. 9. 5 mm low-density lesion pancreatic body (series 5, image 10) unchanged. This is felt unlikely to be a primary malignancy. 10. Right parapelvic 2.2 cm cyst without significant change. Scattered low-density renal lesions bilaterally too small to adequately characterize. Some were present previously. Statistically  these are likely cysts. 11. Stable adrenal gland hyperplasia. 12. Gynecomastia. 13. Prostate gland causes slight impression upon the bladder base. 14.  Aortic Atherosclerosis. 15. Sequential pacemaker in place. 16. Gallstones.   04/23/18 PET/CT revealed Innumerable hypermetabolic lytic lesions throughout the skeleton especially involving the spine, ribs, bony pelvis along with some involvement of both proximal femurs. The appearance is compatible with pathologic diagnosis of plasma cell neoplasm could well reflect multiple myeloma. Resulting pathologic fractures of the right lateral clavicle and of multiple bilateral ribs. Lesions are present along the cortical margins of the spinal canal. 2. Both the area and the stomach in the area in the sigmoid colon drawn attention to on prior CT scan appear benign normal today. 3. The confluence of AP window lymph nodes measures  about 1.2 cm in short axis with maximum SUV 3.1, which is mildly above the blood pool merit surveillance. 4. Other imaging findings of potential clinical significance: Aortic Atherosclerosis. Coronary atherosclerosis. Pacemaker noted. Borderline prostatomegaly. Cholelithiasis.  04/21/18 BM Bx revealed Normocellular bone marrow with Plasma Cell neoplasm. Scattered medium sized clusters of kappa-restricted plasma cells (14% aspirate, 10% CD138 immunohistochemistry.   I do suspect that the burden of disease is underestimated in the bone marrow sample given PET/CT findings of innumerable lytic lesions and an M spike of 3.4g   06/08/18 DG Hips bilat which revealed Innumerable lucencies are noted throughout the pelvis and proximal femurs, similar findings noted on prior PET-CT of 04/23/2018. Findings are consistent patient's known multiple myeloma. Femoral necks are intact. 2.  Aortoiliac atherosclerotic vascular disease.  2. Pathologic right clavicle fracture due to myeloma 3. Hypercalcemia 03/25/18 CMP revealed Calcium at 13.8-- now down to  11.1 Will begin Xgeva injections, and strongly recommended keeping very well hydrated   PLAN:  -Discussed pt labwork today, 12/14/2018; blood counts are stable, PLT borderline low -The pt has no prohibitive toxicities from continuing C9D1 Daratumumab at this time. -treatment orders placed , reviewed and signed -Will continue maintenance monthly Daratumumab until progression or intolerance -Previously discussed the option to pursue transplant consideration vs maintenance therapy after completing induction. The pt notes that he is very comfortable with not pursuing transplant, and is appreciating the current preservation of his quality of life without intrusive therapies. -CIDP has been considered in the treatment choice -Continue Xgeva every 4 weeks -Will see the pt back in 4 weeks   -Please schedule next 3 doses of montly daratumumab with labs and MD visit -Continue Xgeva every 4 weeks   All of the patients questions were answered with apparent satisfaction. The patient knows to call the clinic with any problems, questions or concerns.  The total time spent in the appt was 25 minutes and more than 50% was on counseling and direct patient cares.   Sullivan Lone MD MS AAHIVMS [facility]  (Office):       [phone] (Work cell):  [phone] (Fax):           [phone]  12/14/2018 10:57 AM  I, De Burrs, am acting as a scribe for Dr. Irene Limbo  .I have reviewed the above documentation for accuracy and completeness, and I agree with the above. Brunetta Genera MD

## 2018-12-14 ENCOUNTER — Inpatient Hospital Stay: Payer: Medicare Other | Attending: Hematology

## 2018-12-14 ENCOUNTER — Other Ambulatory Visit: Payer: Self-pay

## 2018-12-14 ENCOUNTER — Inpatient Hospital Stay: Payer: Medicare Other

## 2018-12-14 ENCOUNTER — Inpatient Hospital Stay (HOSPITAL_BASED_OUTPATIENT_CLINIC_OR_DEPARTMENT_OTHER): Payer: Medicare Other | Admitting: Hematology

## 2018-12-14 VITALS — BP 165/90 | HR 62 | Resp 16

## 2018-12-14 VITALS — BP 165/84 | HR 62 | Temp 98.7°F | Resp 18 | Ht 71.0 in | Wt 210.7 lb

## 2018-12-14 DIAGNOSIS — Z79899 Other long term (current) drug therapy: Secondary | ICD-10-CM | POA: Insufficient documentation

## 2018-12-14 DIAGNOSIS — I2510 Atherosclerotic heart disease of native coronary artery without angina pectoris: Secondary | ICD-10-CM | POA: Insufficient documentation

## 2018-12-14 DIAGNOSIS — M6281 Muscle weakness (generalized): Secondary | ICD-10-CM | POA: Insufficient documentation

## 2018-12-14 DIAGNOSIS — C9000 Multiple myeloma not having achieved remission: Secondary | ICD-10-CM

## 2018-12-14 DIAGNOSIS — Z5112 Encounter for antineoplastic immunotherapy: Secondary | ICD-10-CM | POA: Insufficient documentation

## 2018-12-14 DIAGNOSIS — Z87891 Personal history of nicotine dependence: Secondary | ICD-10-CM | POA: Insufficient documentation

## 2018-12-14 DIAGNOSIS — R600 Localized edema: Secondary | ICD-10-CM | POA: Diagnosis not present

## 2018-12-14 DIAGNOSIS — C7951 Secondary malignant neoplasm of bone: Secondary | ICD-10-CM | POA: Diagnosis not present

## 2018-12-14 DIAGNOSIS — I700 Atherosclerosis of aorta: Secondary | ICD-10-CM | POA: Diagnosis not present

## 2018-12-14 DIAGNOSIS — Z950 Presence of cardiac pacemaker: Secondary | ICD-10-CM | POA: Insufficient documentation

## 2018-12-14 DIAGNOSIS — M899 Disorder of bone, unspecified: Secondary | ICD-10-CM | POA: Diagnosis not present

## 2018-12-14 DIAGNOSIS — I5032 Chronic diastolic (congestive) heart failure: Secondary | ICD-10-CM | POA: Insufficient documentation

## 2018-12-14 DIAGNOSIS — J45909 Unspecified asthma, uncomplicated: Secondary | ICD-10-CM | POA: Insufficient documentation

## 2018-12-14 DIAGNOSIS — Z7982 Long term (current) use of aspirin: Secondary | ICD-10-CM | POA: Insufficient documentation

## 2018-12-14 DIAGNOSIS — Z7189 Other specified counseling: Secondary | ICD-10-CM

## 2018-12-14 DIAGNOSIS — N400 Enlarged prostate without lower urinary tract symptoms: Secondary | ICD-10-CM | POA: Insufficient documentation

## 2018-12-14 LAB — CBC WITH DIFFERENTIAL/PLATELET
Abs Immature Granulocytes: 0.01 10*3/uL (ref 0.00–0.07)
Basophils Absolute: 0.1 10*3/uL (ref 0.0–0.1)
Basophils Relative: 1 %
Eosinophils Absolute: 0.4 10*3/uL (ref 0.0–0.5)
Eosinophils Relative: 7 %
HCT: 39.3 % (ref 39.0–52.0)
Hemoglobin: 12.4 g/dL — ABNORMAL LOW (ref 13.0–17.0)
Immature Granulocytes: 0 %
Lymphocytes Relative: 42 %
Lymphs Abs: 2.7 10*3/uL (ref 0.7–4.0)
MCH: 27.4 pg (ref 26.0–34.0)
MCHC: 31.6 g/dL (ref 30.0–36.0)
MCV: 86.8 fL (ref 80.0–100.0)
Monocytes Absolute: 0.6 10*3/uL (ref 0.1–1.0)
Monocytes Relative: 9 %
Neutro Abs: 2.5 10*3/uL (ref 1.7–7.7)
Neutrophils Relative %: 41 %
Platelets: 144 10*3/uL — ABNORMAL LOW (ref 150–400)
RBC: 4.53 MIL/uL (ref 4.22–5.81)
RDW: 14.5 % (ref 11.5–15.5)
WBC: 6.3 10*3/uL (ref 4.0–10.5)
nRBC: 0 % (ref 0.0–0.2)

## 2018-12-14 LAB — CMP (CANCER CENTER ONLY)
ALT: 13 U/L (ref 0–44)
AST: 17 U/L (ref 15–41)
Albumin: 3.8 g/dL (ref 3.5–5.0)
Alkaline Phosphatase: 69 U/L (ref 38–126)
Anion gap: 10 (ref 5–15)
BUN: 18 mg/dL (ref 8–23)
CO2: 23 mmol/L (ref 22–32)
Calcium: 9.2 mg/dL (ref 8.9–10.3)
Chloride: 109 mmol/L (ref 98–111)
Creatinine: 1.11 mg/dL (ref 0.61–1.24)
GFR, Est AFR Am: >60 mL/min (ref >60)
GFR, Estimated: >60 mL/min (ref >60)
Glucose, Bld: 99 mg/dL (ref 70–99)
Potassium: 4 mmol/L (ref 3.5–5.1)
Sodium: 142 mmol/L (ref 135–145)
Total Bilirubin: 0.8 mg/dL (ref 0.3–1.2)
Total Protein: 6.7 g/dL (ref 6.5–8.1)

## 2018-12-14 MED ORDER — ACETAMINOPHEN 325 MG PO TABS
ORAL_TABLET | ORAL | Status: AC
  Filled 2018-12-14: qty 2

## 2018-12-14 MED ORDER — ACETAMINOPHEN 325 MG PO TABS
650.0000 mg | ORAL_TABLET | Freq: Once | ORAL | Status: AC
  Administered 2018-12-14: 650 mg via ORAL

## 2018-12-14 MED ORDER — MONTELUKAST SODIUM 10 MG PO TABS
ORAL_TABLET | ORAL | Status: AC
  Filled 2018-12-14: qty 1

## 2018-12-14 MED ORDER — FAMOTIDINE IN NACL 20-0.9 MG/50ML-% IV SOLN
INTRAVENOUS | Status: AC
  Filled 2018-12-14: qty 50

## 2018-12-14 MED ORDER — MONTELUKAST SODIUM 10 MG PO TABS
10.0000 mg | ORAL_TABLET | Freq: Once | ORAL | Status: AC
  Administered 2018-12-14: 11:00:00 10 mg via ORAL

## 2018-12-14 MED ORDER — SODIUM CHLORIDE 0.9 % IV SOLN
16.2000 mg/kg | Freq: Once | INTRAVENOUS | Status: AC
  Administered 2018-12-14: 13:00:00 1400 mg via INTRAVENOUS
  Filled 2018-12-14: qty 10

## 2018-12-14 MED ORDER — DIPHENHYDRAMINE HCL 25 MG PO CAPS
ORAL_CAPSULE | ORAL | Status: AC
  Filled 2018-12-14: qty 2

## 2018-12-14 MED ORDER — DIPHENHYDRAMINE HCL 25 MG PO CAPS
50.0000 mg | ORAL_CAPSULE | Freq: Once | ORAL | Status: AC
  Administered 2018-12-14: 11:00:00 50 mg via ORAL

## 2018-12-14 MED ORDER — FAMOTIDINE IN NACL 20-0.9 MG/50ML-% IV SOLN
20.0000 mg | Freq: Once | INTRAVENOUS | Status: AC
  Administered 2018-12-14: 11:00:00 20 mg via INTRAVENOUS

## 2018-12-14 MED ORDER — SODIUM CHLORIDE 0.9 % IV SOLN
Freq: Once | INTRAVENOUS | Status: AC
  Administered 2018-12-14: 11:00:00 via INTRAVENOUS
  Filled 2018-12-14: qty 250

## 2018-12-14 MED ORDER — SODIUM CHLORIDE 0.9 % IV SOLN
20.0000 mg | Freq: Once | INTRAVENOUS | Status: AC
  Administered 2018-12-14: 12:00:00 20 mg via INTRAVENOUS
  Filled 2018-12-14: qty 20

## 2018-12-14 NOTE — Patient Instructions (Signed)
[facility] Discharge Instructions for Patients Receiving Chemotherapy  Today you received the following chemotherapy agents: Darzalex  To help prevent nausea and vomiting after your treatment, we encourage you to take your nausea medication as directed.    If you develop nausea and vomiting that is not controlled by your nausea medication, call the clinic.   BELOW ARE SYMPTOMS THAT SHOULD BE REPORTED IMMEDIATELY:  *FEVER GREATER THAN 100.5 F  *CHILLS WITH OR WITHOUT FEVER  NAUSEA AND VOMITING THAT IS NOT CONTROLLED WITH YOUR NAUSEA MEDICATION  *UNUSUAL SHORTNESS OF BREATH  *UNUSUAL BRUISING OR BLEEDING  TENDERNESS IN MOUTH AND THROAT WITH OR WITHOUT PRESENCE OF ULCERS  *URINARY PROBLEMS  *BOWEL PROBLEMS  UNUSUAL RASH Items with * indicate a potential emergency and should be followed up as soon as possible.  Feel free to call the clinic should you have any questions or concerns. The clinic phone number is [phone].  Please show the CHEMO ALERT CARD at check-in to the Emergency Department and triage nurse.   

## 2018-12-15 ENCOUNTER — Telehealth: Payer: Self-pay | Admitting: Hematology

## 2018-12-15 LAB — MULTIPLE MYELOMA PANEL, SERUM
Albumin SerPl Elph-Mcnc: 3.6 g/dL (ref 2.9–4.4)
Albumin/Glob SerPl: 1.4 (ref 0.7–1.7)
Alpha 1: 0.2 g/dL (ref 0.0–0.4)
Alpha2 Glob SerPl Elph-Mcnc: 0.9 g/dL (ref 0.4–1.0)
B-Globulin SerPl Elph-Mcnc: 1 g/dL (ref 0.7–1.3)
Gamma Glob SerPl Elph-Mcnc: 0.5 g/dL (ref 0.4–1.8)
Globulin, Total: 2.6 g/dL (ref 2.2–3.9)
IgA: 159 mg/dL (ref 61–437)
IgG (Immunoglobin G), Serum: 621 mg/dL (ref 603–1613)
IgM (Immunoglobulin M), Srm: 54 mg/dL (ref 15–143)
Total Protein ELP: 6.2 g/dL (ref 6.0–8.5)

## 2018-12-15 NOTE — Telephone Encounter (Signed)
Scheduled appt per 8/3 los

## 2019-01-09 NOTE — Progress Notes (Signed)
° ° °HEMATOLOGY/ONCOLOGY CLINIC NOTE ° °Date of Service: 01/11/2019 ° °Patient Care Team: °Banks, Shannon R, MD as PCP - General (Family Medicine) °Taylor, Gregg W, MD (Cardiology) °Taylor, Gregg W, MD (Cardiology) °Patel, Donika K, DO as Consulting Physician (Neurology) ° °CHIEF COMPLAINTS/PURPOSE OF CONSULTATION:  °Continue mx of myeloma ° °HISTORY OF PRESENTING ILLNESS:  ° °Jonathon [name] is a wonderful 83 y.o. male who has been referred to us by Dr. Shannon Banks for evaluation and management of Lytic bone lesions. He is accompanied today by his daughter. The pt reports that he is doing well overall. He is currently a resident at [facility] in [place], [place].  ° °The pt presented to the ED on 03/24/18 with a right distal clavicle fracture, which occurred after trying to get out of bed and pushing himself up with his right arm. He notes that his energy has been noticeably decreased for the last month. He also notes that his lower back has a slight sting when trying to stand up from sitting. He also notes some pain in his lower, right chest wall, which is exacerbated when coughing. The pt has been taking 50mg Tramadol which is controlling his pain. He does endorse some weakening of both of his lower extremities in the past 1-2 months, which he attributes somewhat to his recent lower back pain.  ° °The pt denies any bowel abnormalities recently. He notes that he stays very well hydrated, and urinates 4-5 times a night, and does have BPH, and takes Flomax. The pt denies any recent changes in his urination habits.  ° °The pt notes that his appetite decreased for a few months, until he began assisted living. He has lost 9 pounds in the last week.  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 He now uses a walker to ambulate. He notes that the CIDP symptoms are primarily located to his legs.  He sees Dr. Donika Patel in Neurology. He took steroids without any symptomatic relief. IVIG was cost-prohibitive for the patient, but was recommended.   ° °Regarding the patient's anemia in April 2019, he denies any obvious bleeding at that time, and denies surgeries.   ° °The pt notes that his last colonoscopy, in 2015, was not concerning, which is corroborated by chart review.  ° °The pt and daughter deny any concerns for memory issues, and the daughter adds his memory is "elephant-like". They are not sure why Namenda is on his medication list.  ° °The pt denies any recent dental procedures, or concerns for dental issues. He has maintained every 6 month dental cleanings for all of his life.  ° °Of note prior to the patient's visit today, pt has had a CT C/A/P completed on 03/31/18 with results revealing IMPRESSION: °1. Lytic lesion throughout all visualized vertebra from the lower °cervical spine through the sacrum. Epidural extension of tumor T2, T3 and L2 level. Right L3-4 foraminal extension tumor. °2. Lytic lesions involving majority of ribs. Multiple pathologic rib °fractures with bony expansion/sub pleural extension of tumor at °several levels. 3. Lytic lesions of the scapula, hips bilaterally and pelvis bilaterally. With further progression of tumor, patient may be at risk for pathologic fractures. Pathologic fracture right clavicle incompletely assessed. 4. Circumferential narrowing sigmoid colon. Question possibility of underlying mass (series 2, image 97). Alternatively this could represent muscular hypertrophy/peristalsis. 5. Gastric   Gastric antrum circumferential narrowing. Cannot exclude mass although this may be related to peristalsis. 6. No primary lung malignancy noted. 7. Matted aortic pulmonary window lymph nodes. 8. Circumferential urinary bladder wall thickening greater anterior dome region. Right lateral bladder diverticulum. 9. 5 mm low-density lesion pancreatic body (series 5, image 10) unchanged.  This is felt unlikely to be a primary malignancy. 10. Right parapelvic 2.2 cm cyst without significant change. Scattered low-density renal lesions bila2terally too small to adequately characterize. Some were present previously. Statistically these are likely cysts. 11. Stable adrenal gland hyperplasia. 12. Gynecomastia. 13. Prostate gland causes slight impression upon the bladder base. 14.  Aortic Atherosclerosis. 15. Sequential pacemaker in place. 16. Gallstones.  Most recent lab results (03/25/18) of CBC w/diff and CMP is as follows: all values are WNL except for RBC at 4.14, HGB at 11.3, HCT at 33.6, Chloride at 94, CO2 at 33, Glucose at 100, BUN at 25, Total Protein at 9.4, Albumin at 3.4, Calcium at 13.8, GFR at 58. 03/25/18 PSA, Total was normal at 1.2  On review of systems, pt reports positional lower back pain, lower right chest wall pain, recently decreased energy levels, relatively weaker appetite, weight loss, right clavicle pain, weakening of the lower extremities, moving his bowels well, and denies changes in urination, abdominal pains, changes in breathing, changes in bowel habits, pain along the spine, leg swelling, and any other symptoms.  Interval History:   YADRIEL KERRIGAN returns today for management, evaluation, and C10D1 Daratumumab treatment of his Multiple Myeloma. The patient's last visit with [place] was on 12/14/2018. The pt reports that he is doing well overall.  The pt reports that he is eating well and still cycling 40 minutes per day on his bike. He has no new concerns.   Lab results today (01/11/19) of CBC w/diff and CMP is as follows: all values are WNL except for Hgb at 12.6, HCT at 38.9, Platelets at 149K.  01/11/2019 MMP shows M spike of 0.1g/dl  On review of systems, pt reports denies new bone pains, new back pains, abdominal pain, infection issues, fevers, diarrhea and any other symptoms.    MEDICAL HISTORY:  Past Medical History:  Diagnosis Date   Asthma     BENIGN PROSTATIC HYPERTROPHY, HX OF 10/25/2008   Bone metastases ([place]) 04/14/2018   BRADYCARDIA 2005   Chronic diastolic congestive heart failure ([place]) 06/05/2016   CIDP (chronic inflammatory demyelinating polyneuropathy) ([place])    HYPERTENSION 11/21/2006   Iron deficiency anemia, unspecified 04/19/2013   Multiple myeloma ([place]) 04/29/2018   NEPHROLITHIASIS, HX OF 11/21/2006 and 10/15/2008   PACEMAKER, PERMANENT 2005   Gen change 2014 Medtronic Adaptic L dual-chamber pacemaker, serial #[id number]    SVT (supraventricular tachycardia) ([place])    TOBACCO ABUSE 10/25/2008   Quit 2012    SURGICAL HISTORY: Past Surgical History:  Procedure Laterality Date   COLONOSCOPY  2004,2009   PACEMAKER INSERTION  2005   PERMANENT PACEMAKER GENERATOR CHANGE N/A 06/19/2012   Procedure: PERMANENT PACEMAKER GENERATOR CHANGE;  Surgeon: [name], MD; Medtronic Adaptic L dual-chamber pacemaker, serial #[id number]     SKIN GRAFT Right 1962   wrist    SOCIAL HISTORY: Social History   Socioeconomic History   Marital status: Divorced    Spouse name: Not on file   Number of children: 4   Years of education: Not on file   Highest education level: Not on file  Occupational History   Occupation: ENVIROMENTAL SERVICE    Employer: [employer]   Occupation: retired  Scientist, product/process development strain: Not on [employer] insecurity    Worry: Not on file    Inability: Not on Lexicographer needs    Medical: Not on file    Non-medical: Not on file  Tobacco Use   Smoking status: Former Smoker    Packs/day: 0.25    Years: 46.00    Pack years: 11.50    Types: Cigarettes    Start date: 03/16/1965    Quit date: 06/15/2010    Years since quitting: 8.5   Smokeless tobacco: Never Used   Tobacco comment: quit 3 years ago  Substance and Sexual Activity   Alcohol use: No    Alcohol/week: 0.0 standard drinks   Drug use: No   Sexual activity: Not on file    Lifestyle   Physical activity    Days per week: Not on file    Minutes per session: Not on file   Stress: Not on file  Relationships   Social connections    Talks on phone: Not on file    Gets together: Not on file    Attends religious service: Not on file    Active member of club or organization: Not on file    Attends meetings of clubs or organizations: Not on file    Relationship status: Not on file   Intimate partner violence    Fear of current or ex partner: Not on file    Emotionally abused: Not on file    Physically abused: Not on file    Forced sexual activity: Not on file  Other Topics Concern   Not on file  Social History Narrative   Has relocated from [place] in 2007. 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  Patient in a facility thru [facility]    FAMILY HISTORY: Family History  Problem Relation Age of Onset   Heart attack Father        Died, 30   Kidney disease Father    Parkinson's disease Mother        Died, 15   Breast cancer Sister        Living, 85   Diabetes type II Brother        Living, 70   Breast cancer Sister        Living, 2   Diabetes Daughter        Living, 53   Diabetes Son        Living, 27   Ovarian cancer Daughter    Colon cancer Neg Hx    Rectal cancer Neg Hx    Stomach cancer Neg Hx     ALLERGIES:  is allergic to lexapro [escitalopram oxalate].  MEDICATIONS:  Current Outpatient Medications  Medication Sig Dispense Refill   acyclovir (ZOVIRAX) 400 MG tablet Take 1 tablet (400 mg total) by mouth 2 (two) times daily. 60 tablet 11   aspirin EC 81 MG tablet Take 81 mg by mouth daily.     dexamethasone (DECADRON) 4 MG tablet 78m (5 tabs) with breakfast the day after each Daratumumab infusion 20 tablet 4   flecainide (TAMBOCOR) 50 MG tablet Take 75 mg by mouth daily as needed.     furosemide (LASIX) 40 MG tablet Take 40 mg by mouth daily.     irbesartan (AVAPRO) 300 MG tablet Take 300 mg by mouth daily.     Melatonin 3 MG CAPS  Take 3 mg by mouth at bedtime.  memantine (NAMENDA) 5 MG tablet Take 5 mg by mouth 2 (two) times daily.     metoprolol tartrate (LOPRESSOR) 25 MG tablet Take 1 tablet (25 mg total) by mouth 2 (two) times daily.     ondansetron (ZOFRAN) 8 MG tablet Take 1 tablet (8 mg total) by mouth 2 (two) times daily as needed (Nausea or vomiting). 30 tablet 1   oseltamivir (TAMIFLU) 75 MG capsule Take 1 capsule (75 mg total) by mouth 2 (two) times daily. 9 capsule 0   polyethylene glycol (MIRALAX / GLYCOLAX) packet Take 17 g by mouth daily. 14 each 0   prochlorperazine (COMPAZINE) 10 MG tablet Take 1 tablet (10 mg total) by mouth every 6 (six) hours as needed (Nausea or vomiting). 30 tablet 1   Saccharomyces boulardii (FLORASTOR PO) Take 250 mg by mouth 2 (two) times daily.     tamsulosin (FLOMAX) 0.4 MG CAPS capsule Take 2 capsules (0.8 mg total) by mouth daily. 90 capsule 0   traMADol (ULTRAM) 50 MG tablet Take 1 tablet( 50 mg total) by mouth every 8 hrs as needed for pain 90 tablet 2   vitamin B-12 (CYANOCOBALAMIN) 500 MCG tablet Take 1,000 mcg by mouth daily.     Vitamin D, Ergocalciferol, (DRISDOL) 50000 units CAPS capsule Take 50,000 Units by mouth every 7 (seven) days.     No current facility-administered medications for this visit.    Facility-Administered Medications Ordered in Other Visits  Medication Dose Route Frequency Provider Last Rate Last Dose   sodium chloride flush (NS) 0.9 % injection 10 mL  10 mL Intracatheter PRN Brunetta Genera, MD        REVIEW OF SYSTEMS:    A 10+ POINT REVIEW OF SYSTEMS WAS OBTAINED including neurology, dermatology, psychiatry, cardiac, respiratory, lymph, extremities, GI, GU, Musculoskeletal, constitutional, breasts, reproductive, HEENT.  All pertinent positives are noted in the HPI.  All others are negative.   PHYSICAL EXAMINATION: ECOG PERFORMANCE STATUS: 2 - Symptomatic, <50% confined to bed    GENERAL:alert, in no acute distress and  comfortable SKIN: no acute rashes, no significant lesions EYES: conjunctiva are pink and non-injected, sclera anicteric OROPHARYNX: MMM, no exudates, no oropharyngeal erythema or ulceration NECK: supple, no JVD LYMPH:  no palpable lymphadenopathy in the cervical, axillary or inguinal regions LUNGS: clear to auscultation b/l with normal respiratory effort HEART: regular rate & rhythm ABDOMEN:  normoactive bowel sounds , non tender, not distended. No palpable hepatosplenomegaly. Extremity: no pedal edema PSYCH: alert & oriented x 3 with fluent speech NEURO: no focal motor/sensory deficits  LABORATORY DATA:  I have reviewed the data as listed  . CBC Latest Ref Rng & Units 01/11/2019 12/14/2018 11/16/2018  WBC 4.0 - 10.5 K/uL 6.6 6.3 6.5  Hemoglobin 13.0 - 17.0 g/dL 12.6(L) 12.4(L) 12.5(L)  Hematocrit 39.0 - 52.0 % 38.9(L) 39.3 39.6  Platelets 150 - 400 K/uL 149(L) 144(L) 150    . CMP Latest Ref Rng & Units 01/11/2019 12/14/2018 11/16/2018  Glucose 70 - 99 mg/dL 99 99 95  BUN 8 - 23 mg/dL _0 Creatinine 0.61 - 1.24 mg/dL 1.13 1.11 1.11  Sodium 135 - 145 mmol/L 140 142 142  Potassium 3.5 - 5.1 mmol/L 4.0 4.0 4.1  Chloride 98 - 111 mmol/L 107 109 110  CO2 22 - 32 mmol/L _1 Calcium 8.9 - 10.3 mg/dL 8.9 9.2 8.3(L)  Total Protein 6.5 - 8.1 g/dL 6.7 6.7 6.6  Total Bilirubin 0.3 - 1.2 mg/dL 0.5 0.8  Alkaline Phos 38 - 126 U/L 64 69 63  °AST 15 - 41 U/L 18 17 16  °ALT 0 - 44 U/L 13 13 12  ° °12/14/2018 MMP  ° ° °12/14/2018 MMP  ° ° °04/17/18 BM Bx: ° ° ° °RADIOGRAPHIC STUDIES: °I have personally reviewed the radiological images as listed and agreed with the findings in the report. °No results found. ° °ASSESSMENT & PLAN:  °82 y.o. male with ° °1. Lytic bone lesions - w/u consistent with newly diagnosed multiple myeloma °Labs upon initial presentation from 04/14/18; hypercalcemic with Calcium at 13.8, renal function up form baseline with Creatinine at 1.48, HGB at 11.3. PSA was normal.   ° °03/31/18 CT C/A/P revealed 1. Lytic lesion throughout all visualized vertebra from the lower cervical spine through the sacrum. Epidural extension of tumor T2, T3 and L2 level. Right L3-4 foraminal extension tumor. 2. Lytic lesions involving majority of ribs. Multiple pathologic rib fractures with bony expansion/sub pleural extension of tumor at several levels. 3. Lytic lesions of the scapula, hips bilaterally and pelvis bilaterally. With further progression of tumor, patient may be at risk for pathologic fractures. Pathologic fracture right clavicle incompletely assessed. 4. Circumferential narrowing sigmoid colon. Question possibility of underlying mass (series 2, image 97). Alternatively this could represent muscular hypertrophy/peristalsis. 5. Gastric antrum circumferential narrowing. Cannot exclude mass although this may be related to peristalsis. 6. No primary lung malignancy noted. 7. Matted aortic pulmonary window lymph nodes. 8. Circumferential urinary bladder wall thickening greater anterior dome region. Right lateral bladder diverticulum. 9. 5 mm low-density lesion pancreatic body (series 5, image 10) unchanged. This is felt unlikely to be a primary malignancy. 10. Right parapelvic 2.2 cm cyst without significant change. Scattered low-density renal lesions bilaterally too small to adequately characterize. Some were present previously. Statistically these are likely cysts. 11. Stable adrenal gland hyperplasia. 12. Gynecomastia. 13. Prostate gland causes slight impression upon the bladder base. 14.  Aortic Atherosclerosis. 15. Sequential pacemaker in place. 16. Gallstones.  ° °04/23/18 PET/CT revealed Innumerable hypermetabolic lytic lesions throughout the skeleton especially involving the spine, ribs, bony pelvis along with some involvement of both proximal femurs. The appearance is compatible with pathologic diagnosis of plasma cell neoplasm could well reflect multiple myeloma. Resulting pathologic  fractures of the right lateral clavicle and of multiple bilateral ribs. Lesions are present along the cortical margins of the spinal canal. 2. Both the area and the stomach in the area in the sigmoid colon drawn attention to on prior CT scan appear benign normal today. 3. The confluence of AP window lymph nodes measures about 1.2 cm in short axis with maximum SUV 3.1, which is mildly above the blood pool merit surveillance. 4. Other imaging findings of potential clinical significance: Aortic Atherosclerosis. Coronary atherosclerosis. Pacemaker noted. Borderline prostatomegaly. Cholelithiasis. ° °04/21/18 BM Bx revealed Normocellular bone marrow with Plasma Cell neoplasm. Scattered medium sized clusters of kappa-restricted plasma cells (14% aspirate, 10% CD138 immunohistochemistry.   °I do suspect that the burden of disease is underestimated in the bone marrow sample given PET/CT findings of innumerable lytic lesions and an M spike of 3.4g  ° °06/08/18 DG Hips bilat which revealed Innumerable lucencies are noted throughout the pelvis and proximal femurs, similar findings noted on prior PET-CT of 04/23/2018. Findings are consistent patient's known multiple myeloma. Femoral necks are intact. 2.  Aortoiliac atherosclerotic vascular disease. ° °2. Pathologic right clavicle fracture due to myeloma °3. Hypercalcemia- resolved. ° °PLAN:  °-Discussed pt labwork today, 01/11/19; all values are   WNL except for Hgb at 12.6, HCT at 38.9, Platelets at 149K.    °-Discussed 01/11/2019 MMP shows M spike of 0.1g/dl IgG kappa ?related to daratumumab vs MM °-The pt has no prohibitive toxicities from continuing C10D1 Daratumumab at this time. °-Will continue maintenance monthly Daratumumab until progression or intolerance °-Previously discussed the option to pursue transplant consideration vs maintenance therapy after completing induction. The pt notes that he is very comfortable with not pursuing transplant, and is appreciating the  current preservation of his quality of life without intrusive therapies. °-CIDP has been considered in the treatment choice °-Continue Xgeva every 4 weeks ° °FOLLOW UP: °-F/u as per scheduled appointments on 02/08/2019 with labs, MD visit and Daratumumab °-continue Xgeva q4weeks -plz schedule next 4 doses ° °The total time spent in the appt was 25 minutes and more than 50% was on counseling and direct patient cares. ° °All of the patient's questions were answered with apparent satisfaction. The patient knows to call the clinic with any problems, questions or concerns. °  °  MD MS AAHIVMS SCH CTH °Hematology/Oncology Physician °[facility] ° °(Office):       [phone] °(Work cell):  [phone] °(Fax):           [phone] ° °01/11/2019 8:53 AM ° °I, Jazzmine Knight, am acting as a scribe for Dr.  .  ° °.I have reviewed the above documentation for accuracy and completeness, and I agree with the above. °. Kishore  MD °  °  °

## 2019-01-11 ENCOUNTER — Inpatient Hospital Stay: Payer: Medicare Other

## 2019-01-11 ENCOUNTER — Telehealth: Payer: Self-pay | Admitting: Hematology

## 2019-01-11 ENCOUNTER — Other Ambulatory Visit: Payer: Self-pay

## 2019-01-11 ENCOUNTER — Inpatient Hospital Stay (HOSPITAL_BASED_OUTPATIENT_CLINIC_OR_DEPARTMENT_OTHER): Payer: Medicare Other | Admitting: Hematology

## 2019-01-11 VITALS — BP 169/87 | HR 68 | Temp 98.6°F | Resp 18 | Ht 71.0 in | Wt 215.7 lb

## 2019-01-11 VITALS — BP 138/75 | HR 64 | Temp 98.0°F | Resp 17

## 2019-01-11 DIAGNOSIS — R600 Localized edema: Secondary | ICD-10-CM | POA: Diagnosis not present

## 2019-01-11 DIAGNOSIS — Z7189 Other specified counseling: Secondary | ICD-10-CM

## 2019-01-11 DIAGNOSIS — Z5112 Encounter for antineoplastic immunotherapy: Secondary | ICD-10-CM | POA: Diagnosis not present

## 2019-01-11 DIAGNOSIS — C9000 Multiple myeloma not having achieved remission: Secondary | ICD-10-CM | POA: Diagnosis not present

## 2019-01-11 DIAGNOSIS — M899 Disorder of bone, unspecified: Secondary | ICD-10-CM | POA: Diagnosis not present

## 2019-01-11 DIAGNOSIS — C7951 Secondary malignant neoplasm of bone: Secondary | ICD-10-CM

## 2019-01-11 DIAGNOSIS — M6281 Muscle weakness (generalized): Secondary | ICD-10-CM | POA: Diagnosis not present

## 2019-01-11 LAB — CBC WITH DIFFERENTIAL/PLATELET
Abs Immature Granulocytes: 0.02 10*3/uL (ref 0.00–0.07)
Basophils Absolute: 0.1 10*3/uL (ref 0.0–0.1)
Basophils Relative: 1 %
Eosinophils Absolute: 0.5 10*3/uL (ref 0.0–0.5)
Eosinophils Relative: 8 %
HCT: 38.9 % — ABNORMAL LOW (ref 39.0–52.0)
Hemoglobin: 12.6 g/dL — ABNORMAL LOW (ref 13.0–17.0)
Immature Granulocytes: 0 %
Lymphocytes Relative: 44 %
Lymphs Abs: 2.9 10*3/uL (ref 0.7–4.0)
MCH: 27.6 pg (ref 26.0–34.0)
MCHC: 32.4 g/dL (ref 30.0–36.0)
MCV: 85.1 fL (ref 80.0–100.0)
Monocytes Absolute: 0.7 10*3/uL (ref 0.1–1.0)
Monocytes Relative: 10 %
Neutro Abs: 2.4 10*3/uL (ref 1.7–7.7)
Neutrophils Relative %: 37 %
Platelets: 149 10*3/uL — ABNORMAL LOW (ref 150–400)
RBC: 4.57 MIL/uL (ref 4.22–5.81)
RDW: 14.3 % (ref 11.5–15.5)
WBC: 6.6 10*3/uL (ref 4.0–10.5)
nRBC: 0 % (ref 0.0–0.2)

## 2019-01-11 LAB — CMP (CANCER CENTER ONLY)
ALT: 13 U/L (ref 0–44)
AST: 18 U/L (ref 15–41)
Albumin: 3.9 g/dL (ref 3.5–5.0)
Alkaline Phosphatase: 64 U/L (ref 38–126)
Anion gap: 8 (ref 5–15)
BUN: 14 mg/dL (ref 8–23)
CO2: 25 mmol/L (ref 22–32)
Calcium: 8.9 mg/dL (ref 8.9–10.3)
Chloride: 107 mmol/L (ref 98–111)
Creatinine: 1.13 mg/dL (ref 0.61–1.24)
GFR, Est AFR Am: >60 mL/min (ref >60)
GFR, Estimated: >60 mL/min (ref >60)
Glucose, Bld: 99 mg/dL (ref 70–99)
Potassium: 4 mmol/L (ref 3.5–5.1)
Sodium: 140 mmol/L (ref 135–145)
Total Bilirubin: 0.5 mg/dL (ref 0.3–1.2)
Total Protein: 6.7 g/dL (ref 6.5–8.1)

## 2019-01-11 MED ORDER — ACETAMINOPHEN 325 MG PO TABS
650.0000 mg | ORAL_TABLET | Freq: Once | ORAL | Status: AC
  Administered 2019-01-11: 10:00:00 650 mg via ORAL

## 2019-01-11 MED ORDER — DENOSUMAB 120 MG/1.7ML ~~LOC~~ SOLN
120.0000 mg | Freq: Once | SUBCUTANEOUS | Status: AC
  Administered 2019-01-11: 11:00:00 120 mg via SUBCUTANEOUS

## 2019-01-11 MED ORDER — MONTELUKAST SODIUM 10 MG PO TABS
10.0000 mg | ORAL_TABLET | Freq: Once | ORAL | Status: AC
  Administered 2019-01-11: 10:00:00 10 mg via ORAL

## 2019-01-11 MED ORDER — FAMOTIDINE IN NACL 20-0.9 MG/50ML-% IV SOLN
INTRAVENOUS | Status: AC
  Filled 2019-01-11: qty 50

## 2019-01-11 MED ORDER — MONTELUKAST SODIUM 10 MG PO TABS
ORAL_TABLET | ORAL | Status: AC
  Filled 2019-01-11: qty 1

## 2019-01-11 MED ORDER — DIPHENHYDRAMINE HCL 25 MG PO CAPS
50.0000 mg | ORAL_CAPSULE | Freq: Once | ORAL | Status: AC
  Administered 2019-01-11: 10:00:00 50 mg via ORAL

## 2019-01-11 MED ORDER — SODIUM CHLORIDE 0.9 % IV SOLN
20.0000 mg | Freq: Once | INTRAVENOUS | Status: AC
  Administered 2019-01-11: 10:00:00 20 mg via INTRAVENOUS
  Filled 2019-01-11: qty 20

## 2019-01-11 MED ORDER — ACETAMINOPHEN 325 MG PO TABS
ORAL_TABLET | ORAL | Status: AC
  Filled 2019-01-11: qty 2

## 2019-01-11 MED ORDER — FAMOTIDINE IN NACL 20-0.9 MG/50ML-% IV SOLN
20.0000 mg | Freq: Once | INTRAVENOUS | Status: AC
  Administered 2019-01-11: 10:00:00 20 mg via INTRAVENOUS

## 2019-01-11 MED ORDER — SODIUM CHLORIDE 0.9 % IV SOLN
Freq: Once | INTRAVENOUS | Status: AC
  Administered 2019-01-11: 10:00:00 via INTRAVENOUS
  Filled 2019-01-11: qty 250

## 2019-01-11 MED ORDER — SODIUM CHLORIDE 0.9 % IV SOLN: 1400.0000 mg | Freq: Once | INTRAVENOUS | Status: DC

## 2019-01-11 MED ORDER — DENOSUMAB 120 MG/1.7ML ~~LOC~~ SOLN
SUBCUTANEOUS | Status: AC
  Filled 2019-01-11: qty 1.7

## 2019-01-11 MED ORDER — SODIUM CHLORIDE 0.9% FLUSH
10.0000 mL | INTRAVENOUS | Status: AC | PRN
  Filled 2019-01-11: qty 10

## 2019-01-11 MED ORDER — SODIUM CHLORIDE 0.9 % IV SOLN
1600.0000 mg | Freq: Once | INTRAVENOUS | Status: AC
  Administered 2019-01-11: 12:00:00 1600 mg via INTRAVENOUS
  Filled 2019-01-11: qty 80

## 2019-01-11 MED ORDER — DIPHENHYDRAMINE HCL 25 MG PO CAPS
ORAL_CAPSULE | ORAL | Status: AC
  Filled 2019-01-11: qty 2

## 2019-01-11 NOTE — Telephone Encounter (Signed)
Scheduled appt per 8/31 los.

## 2019-01-11 NOTE — Patient Instructions (Signed)
[facility] Discharge Instructions for Patients Receiving Chemotherapy  Today you received the following chemotherapy agents: Darzalex  To help prevent nausea and vomiting after your treatment, we encourage you to take your nausea medication as directed.    If you develop nausea and vomiting that is not controlled by your nausea medication, call the clinic.   BELOW ARE SYMPTOMS THAT SHOULD BE REPORTED IMMEDIATELY:  *FEVER GREATER THAN 100.5 F  *CHILLS WITH OR WITHOUT FEVER  NAUSEA AND VOMITING THAT IS NOT CONTROLLED WITH YOUR NAUSEA MEDICATION  *UNUSUAL SHORTNESS OF BREATH  *UNUSUAL BRUISING OR BLEEDING  TENDERNESS IN MOUTH AND THROAT WITH OR WITHOUT PRESENCE OF ULCERS  *URINARY PROBLEMS  *BOWEL PROBLEMS  UNUSUAL RASH Items with * indicate a potential emergency and should be followed up as soon as possible.  Feel free to call the clinic should you have any questions or concerns. The clinic phone number is [phone].  Please show the CHEMO ALERT CARD at check-in to the Emergency Department and triage nurse.   

## 2019-01-13 DIAGNOSIS — B351 Tinea unguium: Secondary | ICD-10-CM | POA: Diagnosis not present

## 2019-01-13 DIAGNOSIS — M79674 Pain in right toe(s): Secondary | ICD-10-CM | POA: Diagnosis not present

## 2019-01-13 LAB — MULTIPLE MYELOMA PANEL, SERUM
Albumin SerPl Elph-Mcnc: 3.6 g/dL (ref 2.9–4.4)
Albumin/Glob SerPl: 1.5 (ref 0.7–1.7)
Alpha 1: 0.2 g/dL (ref 0.0–0.4)
Alpha2 Glob SerPl Elph-Mcnc: 0.9 g/dL (ref 0.4–1.0)
B-Globulin SerPl Elph-Mcnc: 1 g/dL (ref 0.7–1.3)
Gamma Glob SerPl Elph-Mcnc: 0.4 g/dL (ref 0.4–1.8)
Globulin, Total: 2.5 g/dL (ref 2.2–3.9)
IgA: 153 mg/dL (ref 61–437)
IgG (Immunoglobin G), Serum: 625 mg/dL (ref 603–1613)
IgM (Immunoglobulin M), Srm: 49 mg/dL (ref 15–143)
M Protein SerPl Elph-Mcnc: 0.1 g/dL — ABNORMAL HIGH
Total Protein ELP: 6.1 g/dL (ref 6.0–8.5)

## 2019-02-05 NOTE — Progress Notes (Signed)
HEMATOLOGY/ONCOLOGY CLINIC NOTE  Date of Service: 02/08/2019  Patient Care Team: [name], MD as PCP - General (Family Medicine) [name], MD (Cardiology) [name], MD (Cardiology) [name], DO as Consulting Physician (Neurology)  CHIEF COMPLAINTS/PURPOSE OF CONSULTATION:  Continue mx of myeloma  HISTORY OF PRESENTING ILLNESS:   Jonathon [name] is a wonderful 83 y.o. male who has been referred to [place] by Dr. [name] for evaluation and management of Lytic bone lesions. He is accompanied today by his daughter. The pt reports that he is doing well overall. He is currently a resident at [facility] in [place], [place].   The pt presented to the ED on 03/24/18 with a right distal clavicle fracture, which occurred after trying to get out of bed and pushing himself up with his right arm. He notes that his energy has been noticeably decreased for the last month. He also notes that his lower back has a slight sting when trying to stand up from sitting. He also notes some pain in his lower, right chest wall, which is exacerbated when coughing. The pt has been taking 41m Tramadol which is controlling his pain. He does endorse some weakening of both of his lower extremities in the past 1-2 months, which he attributes somewhat to his recent lower back pain.   The pt denies any bowel abnormalities recently. He notes that he stays very well hydrated, and urinates 4-5 times a night, and does have BPH, and takes Flomax. The pt denies any recent changes in his urination habits.   The pt notes that his appetite decreased for a few months, until he began assisted living. He has lost 9 pounds in the last week.   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 He now uses a walker to ambulate. He notes that the CIDP symptoms are primarily located to his legs.  He sees Dr. [name] Neurology. He took steroids without any symptomatic relief. IVIG was cost-prohibitive for the patient, but was recommended.    Regarding the patient's anemia in April 2019, he denies any obvious bleeding at that time, and denies surgeries.    The pt notes that his last colonoscopy, in 2015, was not concerning, which is corroborated by chart review.   The pt and daughter deny any concerns for memory issues, and the daughter adds his memory is "elephant-like". They are not sure why Namenda is on his medication list.   The pt denies any recent dental procedures, or concerns for dental issues. He has maintained every 6 month dental cleanings for all of his life.   Of note prior to the patient's visit today, pt has had a CT C/A/P completed on 03/31/18 with results revealing IMPRESSION: 1. Lytic lesion throughout all visualized vertebra from the lower cervical spine through the sacrum. Epidural extension of tumor T2, T3 and L2 level. Right L3-4 foraminal extension tumor. 2. Lytic lesions involving majority of ribs. Multiple pathologic rib fractures with bony expansion/sub pleural extension of tumor at several levels. 3. Lytic lesions of the scapula, hips bilaterally and pelvis bilaterally. With further progression of tumor, patient may be at risk for pathologic fractures. Pathologic fracture right clavicle incompletely assessed. 4. Circumferential narrowing sigmoid colon. Question possibility of underlying mass (series 2, image 97). Alternatively this could represent muscular hypertrophy/peristalsis. 5. Gastric  antrum circumferential narrowing. Cannot exclude mass although this may be related to peristalsis. 6. No primary lung malignancy noted. 7. Matted aortic pulmonary window lymph nodes. 8. Circumferential urinary bladder wall thickening greater anterior dome region. Right lateral bladder diverticulum. 9. 5 mm low-density lesion pancreatic body (series 5, image 10) unchanged.  This is felt unlikely to be a primary malignancy. 10. Right parapelvic 2.2 cm cyst without significant change. Scattered low-density renal lesions bila2terally too small to adequately characterize. Some were present previously. Statistically these are likely cysts. 11. Stable adrenal gland hyperplasia. 12. Gynecomastia. 13. Prostate gland causes slight impression upon the bladder base. 14.  Aortic Atherosclerosis. 15. Sequential pacemaker in place. 16. Gallstones.  Most recent lab results (03/25/18) of CBC w/diff and CMP is as follows: all values are WNL except for RBC at 4.14, HGB at 11.3, HCT at 33.6, Chloride at 94, CO2 at 33, Glucose at 100, BUN at 25, Total Protein at 9.4, Albumin at 3.4, Calcium at 13.8, GFR at 58. 03/25/18 PSA, Total was normal at 1.2  On review of systems, pt reports positional lower back pain, lower right chest wall pain, recently decreased energy levels, relatively weaker appetite, weight loss, right clavicle pain, weakening of the lower extremities, moving his bowels well, and denies changes in urination, abdominal pains, changes in breathing, changes in bowel habits, pain along the spine, leg swelling, and any other symptoms.  Interval History:   Jonathon [name] returns today for management, evaluation, and C11D1 Daratumumab treatment of his Multiple Myeloma. The patient's last visit with [place] was on 01/11/2019. The pt reports that he is doing well overall.   The pt reports that everything has been stable and he has no new concerns. His appetite has been good and he has been gaining weight. He is taking Miralax about 3 times a week and his bowel movements have been regular (about 3-4x a day). Pt continues to cycle on his bike. Some time ago he did experience back pain when working out but it has since resolved. He continues to walk outside. Dr. Posey Pronto did a screen about a month ago and was pleased with his neurological function. He is not currently on any medication for his CIDP. He  has not had his seasonal flu shot but he has been getting them regularly for the past few years. He has had no issues with the flu shot. Pt had his last Prevnar in 07/2014 and Pneumovax in 2014. Pt has been taking his Vitamin D once a week.   Lab results today (02/08/19) of CBC w/diff and CMP is as follows: all values are WNL except for Hgb at 12.5, Glucose at 108, Calcium at 8.7. 02/10/2019 MMP is in progress   On review of systems, pt denies diarrhea, fevers, infections, back pain, dental issues and any other symptoms.   MEDICAL HISTORY:  Past Medical History:  Diagnosis Date  . Asthma   . BENIGN PROSTATIC HYPERTROPHY, HX OF 10/25/2008  . Bone metastases ([place]) 04/14/2018  . BRADYCARDIA 2005  . Chronic diastolic congestive heart failure ([place]) 06/05/2016  . CIDP (chronic inflammatory demyelinating polyneuropathy) ([place])   . HYPERTENSION 11/21/2006  . Iron deficiency anemia, unspecified 04/19/2013  . Multiple myeloma ([place]) 04/29/2018  . 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 PACEMAKER, PERMANENT 2005   Gen change 2014 Medtronic Adaptic L dual-chamber pacemaker, serial [id number]   . SVT (supraventricular tachycardia) ([place])   . TOBACCO ABUSE 10/25/2008   Quit 2012    SURGICAL HISTORY: Past  Surgical History:  Procedure Laterality Date  . COLONOSCOPY  [id number]  . PACEMAKER INSERTION  2005  . PERMANENT PACEMAKER GENERATOR CHANGE N/A 06/19/2012   Procedure: PERMANENT PACEMAKER GENERATOR CHANGE;  Surgeon: [name], MD; Medtronic Adaptic L dual-chamber pacemaker, serial #[id number]    . SKIN GRAFT Right 1962   wrist    SOCIAL HISTORY: Social History   Socioeconomic History  . Marital status: Divorced    Spouse name: Not on file  . Number of children: 4  . Years of education: Not on file  . Highest education level: Not on file  Occupational History  . Occupation: ENVIROMENTAL Location manager: [employer]  . Occupation: retired  Scientific laboratory technician  .  Financial resource strain: Not on file  . Food insecurity    Worry: Not on file    Inability: Not on file  . Transportation needs    Medical: Not on file    Non-medical: Not on file  Tobacco Use  . Smoking status: Former Smoker    Packs/day: 0.25    Years: 46.00    Pack years: 11.50    Types: Cigarettes    Start date: 03/16/1965    Quit date: 06/15/2010    Years since quitting: 8.6  . Smokeless tobacco: Never Used  . Tobacco comment: quit 3 years ago  Substance and Sexual Activity  . Alcohol use: No    Alcohol/week: 0.0 standard drinks  . Drug use: No  . Sexual activity: Not on file  Lifestyle  . Physical activity    Days per week: Not on file    Minutes per session: Not on file  . Stress: Not on file  Relationships  . Social Herbalist on phone: Not on file    Gets together: Not on file    Attends religious service: Not on file    Active member of club or organization: Not on file    Attends meetings of clubs or organizations: Not on file    Relationship status: Not on file  . Intimate partner violence    Fear of current or ex partner: Not on file    Emotionally abused: Not on file    Physically abused: Not on file    Forced sexual activity: Not on file  Other Topics Concern  . Not on file  Social History Narrative   Has relocated from [place] in 2007. 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  Patient in a facility thru [facility]    FAMILY HISTORY: Family History  Problem Relation Age of Onset  . Heart attack Father        Died, 83  . Kidney disease Father   . Parkinson's disease Mother        Died, 43  . Breast cancer Sister        Living, 66  . Diabetes type II Brother        Living, 35  . Breast cancer Sister        Living, 55  . Diabetes Daughter        Living, 3  . Diabetes Son        Living, 29  . Ovarian cancer Daughter   . Colon cancer Neg Hx   . Rectal cancer Neg Hx   . Stomach cancer Neg Hx     ALLERGIES:  is allergic to lexapro [escitalopram oxalate].   MEDICATIONS:  Current Outpatient Medications  Medication Sig Dispense Refill  . acyclovir (ZOVIRAX) 400  MG tablet Take 1 tablet (400 mg total) by mouth 2 (two) times daily. 60 tablet 11  . aspirin EC 81 MG tablet Take 81 mg by mouth daily.    Marland Kitchen dexamethasone (DECADRON) 4 MG tablet 47m (5 tabs) with breakfast the day after each Daratumumab infusion 20 tablet 4  . flecainide (TAMBOCOR) 50 MG tablet Take 75 mg by mouth daily as needed.    . furosemide (LASIX) 40 MG tablet Take 40 mg by mouth daily.    . irbesartan (AVAPRO) 300 MG tablet Take 300 mg by mouth daily.    . Melatonin 3 MG CAPS Take 3 mg by mouth at bedtime.    . memantine (NAMENDA) 5 MG tablet Take 5 mg by mouth 2 (two) times daily.    . metoprolol tartrate (LOPRESSOR) 25 MG tablet Take 1 tablet (25 mg total) by mouth 2 (two) times daily.    . ondansetron (ZOFRAN) 8 MG tablet Take 1 tablet (8 mg total) by mouth 2 (two) times daily as needed (Nausea or vomiting). 30 tablet 1  . oseltamivir (TAMIFLU) 75 MG capsule Take 1 capsule (75 mg total) by mouth 2 (two) times daily. 9 capsule 0  . polyethylene glycol (MIRALAX / GLYCOLAX) packet Take 17 g by mouth daily. 14 each 0  . prochlorperazine (COMPAZINE) 10 MG tablet Take 1 tablet (10 mg total) by mouth every 6 (six) hours as needed (Nausea or vomiting). 30 tablet 1  . Saccharomyces boulardii (FLORASTOR PO) Take 250 mg by mouth 2 (two) times daily.    . tamsulosin (FLOMAX) 0.4 MG CAPS capsule Take 2 capsules (0.8 mg total) by mouth daily. 90 capsule 0  . traMADol (ULTRAM) 50 MG tablet Take 1 tablet( 50 mg total) by mouth every 8 hrs as needed for pain 90 tablet 2  . vitamin B-12 (CYANOCOBALAMIN) 500 MCG tablet Take 1,000 mcg by mouth daily.    . Vitamin D, Ergocalciferol, (DRISDOL) 50000 units CAPS capsule Take 50,000 Units by mouth every 7 (seven) days.     No current facility-administered medications for this visit.    Facility-Administered Medications Ordered in Other Visits   Medication Dose Route Frequency Provider Last Rate Last Dose  . influenza vaccine adjuvanted (FLUAD) injection 0.5 mL  0.5 mL Intramuscular Once KBrunetta Genera MD      . sodium chloride flush (NS) 0.9 % injection 10 mL  10 mL Intracatheter PRN KBrunetta Genera MD        REVIEW OF SYSTEMS:    A 10+ POINT REVIEW OF SYSTEMS WAS OBTAINED including neurology, dermatology, psychiatry, cardiac, respiratory, lymph, extremities, GI, GU, Musculoskeletal, constitutional, breasts, reproductive, HEENT.  All pertinent positives are noted in the HPI.  All others are negative.   PHYSICAL EXAMINATION: ECOG PERFORMANCE STATUS: 2 - Symptomatic, <50% confined to bed   Exam was given in a chair   GENERAL:alert, in no acute distress and comfortable SKIN: no acute rashes, no significant lesions EYES: conjunctiva are pink and non-injected, sclera anicteric OROPHARYNX: MMM, no exudates, no oropharyngeal erythema or ulceration NECK: supple, no JVD LYMPH:  no palpable lymphadenopathy in the cervical, axillary or inguinal regions LUNGS: clear to auscultation b/l with normal respiratory effort HEART: regular rate & rhythm ABDOMEN:  normoactive bowel sounds , non tender, not distended. No palpable hepatosplenomegaly.  Extremity: trace pedal edema  PSYCH: alert & oriented x 3 with fluent speech NEURO: no focal motor/sensory deficits   LABORATORY DATA:  I have reviewed the data as listed  .  CBC Latest Ref Rng & Units 02/08/2019 01/11/2019 12/14/2018  WBC 4.0 - 10.5 K/uL 5.7 6.6 6.3  Hemoglobin 13.0 - 17.0 g/dL 12.5(L) 12.6(L) 12.4(L)  Hematocrit 39.0 - 52.0 % 39.0 38.9(L) 39.3  Platelets 150 - 400 K/uL 158 149(L) 144(L)    . CMP Latest Ref Rng & Units 02/08/2019 01/11/2019 12/14/2018  Glucose 70 - 99 mg/dL 108(H) 99 99  BUN 8 - 23 mg/dL _0 Creatinine 0.61 - 1.24 mg/dL 1.11 1.13 1.11  Sodium 135 - 145 mmol/L 141 140 142  Potassium 3.5 - 5.1 mmol/L 3.9 4.0 4.0  Chloride 98 - 111 mmol/L 108  107 109  CO2 22 - 32 mmol/L _1 Calcium 8.9 - 10.3 mg/dL 8.7(L) 8.9 9.2  Total Protein 6.5 - 8.1 g/dL 6.5 6.7 6.7  Total Bilirubin 0.3 - 1.2 mg/dL 0.5 0.5 0.8  Alkaline Phos 38 - 126 U/L 66 64 69  AST 15 - 41 U/L _2 ALT 0 - 44 U/L _3 12/14/2018 MMP    12/14/2018 MMP    04/17/18 BM Bx:    RADIOGRAPHIC STUDIES: I have personally reviewed the radiological images as listed and agreed with the findings in the report. No results found.  ASSESSMENT & PLAN:  83 y.o. male with  1. Lytic bone lesions - w/u consistent with newly diagnosed multiple myeloma Labs upon initial presentation from 04/14/18; hypercalcemic with Calcium at 13.8, renal function up form baseline with Creatinine at 1.48, HGB at 11.3. PSA was normal.   03/31/18 CT C/A/P revealed 1. Lytic lesion throughout all visualized vertebra from the lower cervical spine through the sacrum. Epidural extension of tumor T2, T3 and L2 level. Right L3-4 foraminal extension tumor. 2. Lytic lesions involving majority of ribs. Multiple pathologic rib fractures with bony expansion/sub pleural extension of tumor at several levels. 3. Lytic lesions of the scapula, hips bilaterally and pelvis bilaterally. With further progression of tumor, patient may be at risk for pathologic fractures. Pathologic fracture right clavicle incompletely assessed. 4. Circumferential narrowing sigmoid colon. Question possibility of underlying mass (series 2, image 97). Alternatively this could represent muscular hypertrophy/peristalsis. 5. Gastric antrum circumferential narrowing. Cannot exclude mass although this may be related to peristalsis. 6. No primary lung malignancy noted. 7. Matted aortic pulmonary window lymph nodes. 8. Circumferential urinary bladder wall thickening greater anterior dome region. Right lateral bladder diverticulum. 9. 5 mm low-density lesion pancreatic body (series 5, image 10) unchanged. This is felt unlikely to be a primary  malignancy. 10. Right parapelvic 2.2 cm cyst without significant change. Scattered low-density renal lesions bilaterally too small to adequately characterize. Some were present previously. Statistically these are likely cysts. 11. Stable adrenal gland hyperplasia. 12. Gynecomastia. 13. Prostate gland causes slight impression upon the bladder base. 14.  Aortic Atherosclerosis. 15. Sequential pacemaker in place. 16. Gallstones.   04/23/18 PET/CT revealed Innumerable hypermetabolic lytic lesions throughout the skeleton especially involving the spine, ribs, bony pelvis along with some involvement of both proximal femurs. The appearance is compatible with pathologic diagnosis of plasma cell neoplasm could well reflect multiple myeloma. Resulting pathologic fractures of the right lateral clavicle and of multiple bilateral ribs. Lesions are present along the cortical margins of the spinal canal. 2. Both the area and the stomach in the area in the sigmoid colon drawn attention to on prior CT scan appear benign normal today. 3. The confluence of AP window lymph nodes measures about 1.2 cm in short axis  with maximum SUV 3.1, which is mildly above the blood pool merit surveillance. 4. Other imaging findings of potential clinical significance: Aortic Atherosclerosis. Coronary atherosclerosis. Pacemaker noted. Borderline prostatomegaly. Cholelithiasis.  04/21/18 BM Bx revealed Normocellular bone marrow with Plasma Cell neoplasm. Scattered medium sized clusters of kappa-restricted plasma cells (14% aspirate, 10% CD138 immunohistochemistry.   I do suspect that the burden of disease is underestimated in the bone marrow sample given PET/CT findings of innumerable lytic lesions and an M spike of 3.4g   06/08/18 DG Hips bilat which revealed Innumerable lucencies are noted throughout the pelvis and proximal femurs, similar findings noted on prior PET-CT of 04/23/2018. Findings are consistent patient's known multiple myeloma.  Femoral necks are intact. 2.  Aortoiliac atherosclerotic vascular disease.  2. Pathologic right clavicle fracture due to myeloma- resolved 3. Hypercalcemia- resolved.  PLAN:    -Discussed pt labwork today, 02/08/19; all values are WNL except for Hgb at 12.5, Glucose at 108, Calcium at 8.7. -Discussed 02/10/2019 MMP is in progress -Discussed MMP which shows all values WNL except for M Protein at 0.1g/dL, IFE 1 shows "Immunofixation shows IgG monoclonal protein with kappa light chain specificity."  IgG kappa ?related to daratumumab vs MM -The pt has no prohibitive toxicities from continuing C11D1 Daratumumab at this time. -Will continue maintenance monthly Daratumumab until progression or intolerance -Previously discussed the option to pursue transplant consideration vs maintenance therapy after completing induction. The pt notes that he is very comfortable with not pursuing transplant, and is appreciating the current preservation of his quality of life without intrusive therapies. -CIDP has been considered in the treatment choice -Continue Xgeva every 4 weeks -Pt had Prevnar vaccine in 07/2014 and Pneumovax vaccine in 2014 -Will give flu shot today  -Will give Prevnar vaccine with next visit in 1 month -Will give Pneumovax vaccine in 3 months -Will see back in 1 month with labs   FOLLOW UP: -please schedule next 3 doses of daratumumab with labs and MD visit -continue Xgeva q4weeks -plz schedule next 4 doses -flu shot today -Prevnar in 1 month -Pneumovax in 3 months   The total time spent in the appt was 25 minutes and more than 50% was on counseling and direct patient cares.  All of the patient's questions were answered with apparent satisfaction. The patient knows to call the clinic with any problems, questions or concerns.    Sullivan Lone MD [place] AAHIVMS [facility] Hematology/Oncology Physician [facility]  (Office):       [phone] (Work cell):  [phone] (Fax):            [phone]  02/08/2019 10:58 AM  I, Yevette Edwards, am acting as a scribe for Dr. Sullivan Lone.   .I have reviewed the above documentation for accuracy and completeness, and I agree with the above. Brunetta Genera MD

## 2019-02-08 ENCOUNTER — Inpatient Hospital Stay (HOSPITAL_BASED_OUTPATIENT_CLINIC_OR_DEPARTMENT_OTHER): Payer: Medicare Other | Admitting: Hematology

## 2019-02-08 ENCOUNTER — Other Ambulatory Visit: Payer: Medicare Other

## 2019-02-08 ENCOUNTER — Other Ambulatory Visit: Payer: Self-pay

## 2019-02-08 ENCOUNTER — Inpatient Hospital Stay: Payer: Medicare Other

## 2019-02-08 ENCOUNTER — Inpatient Hospital Stay: Payer: Medicare Other | Attending: Hematology

## 2019-02-08 VITALS — BP 156/84 | HR 68 | Temp 98.9°F | Resp 18 | Ht 71.0 in | Wt 215.5 lb

## 2019-02-08 VITALS — BP 149/83 | HR 60 | Temp 97.5°F | Resp 18

## 2019-02-08 DIAGNOSIS — C7951 Secondary malignant neoplasm of bone: Secondary | ICD-10-CM

## 2019-02-08 DIAGNOSIS — C9000 Multiple myeloma not having achieved remission: Secondary | ICD-10-CM

## 2019-02-08 DIAGNOSIS — Z7189 Other specified counseling: Secondary | ICD-10-CM

## 2019-02-08 DIAGNOSIS — Z23 Encounter for immunization: Secondary | ICD-10-CM | POA: Diagnosis not present

## 2019-02-08 DIAGNOSIS — Z0000 Encounter for general adult medical examination without abnormal findings: Secondary | ICD-10-CM

## 2019-02-08 DIAGNOSIS — Z5112 Encounter for antineoplastic immunotherapy: Secondary | ICD-10-CM | POA: Insufficient documentation

## 2019-02-08 LAB — CMP (CANCER CENTER ONLY)
ALT: 11 U/L (ref 0–44)
AST: 16 U/L (ref 15–41)
Albumin: 3.8 g/dL (ref 3.5–5.0)
Alkaline Phosphatase: 66 U/L (ref 38–126)
Anion gap: 7 (ref 5–15)
BUN: 16 mg/dL (ref 8–23)
CO2: 26 mmol/L (ref 22–32)
Calcium: 8.7 mg/dL — ABNORMAL LOW (ref 8.9–10.3)
Chloride: 108 mmol/L (ref 98–111)
Creatinine: 1.11 mg/dL (ref 0.61–1.24)
GFR, Est AFR Am: >60 mL/min (ref >60)
GFR, Estimated: >60 mL/min (ref >60)
Glucose, Bld: 108 mg/dL — ABNORMAL HIGH (ref 70–99)
Potassium: 3.9 mmol/L (ref 3.5–5.1)
Sodium: 141 mmol/L (ref 135–145)
Total Bilirubin: 0.5 mg/dL (ref 0.3–1.2)
Total Protein: 6.5 g/dL (ref 6.5–8.1)

## 2019-02-08 LAB — CBC WITH DIFFERENTIAL/PLATELET
Abs Immature Granulocytes: 0 10*3/uL (ref 0.00–0.07)
Basophils Absolute: 0 10*3/uL (ref 0.0–0.1)
Basophils Relative: 1 %
Eosinophils Absolute: 0.4 10*3/uL (ref 0.0–0.5)
Eosinophils Relative: 6 %
HCT: 39 % (ref 39.0–52.0)
Hemoglobin: 12.5 g/dL — ABNORMAL LOW (ref 13.0–17.0)
Immature Granulocytes: 0 %
Lymphocytes Relative: 41 %
Lymphs Abs: 2.3 10*3/uL (ref 0.7–4.0)
MCH: 27.6 pg (ref 26.0–34.0)
MCHC: 32.1 g/dL (ref 30.0–36.0)
MCV: 86.1 fL (ref 80.0–100.0)
Monocytes Absolute: 0.6 10*3/uL (ref 0.1–1.0)
Monocytes Relative: 10 %
Neutro Abs: 2.4 10*3/uL (ref 1.7–7.7)
Neutrophils Relative %: 42 %
Platelets: 158 10*3/uL (ref 150–400)
RBC: 4.53 MIL/uL (ref 4.22–5.81)
RDW: 14.3 % (ref 11.5–15.5)
WBC: 5.7 10*3/uL (ref 4.0–10.5)
nRBC: 0 % (ref 0.0–0.2)

## 2019-02-08 MED ORDER — DENOSUMAB 120 MG/1.7ML ~~LOC~~ SOLN
120.0000 mg | Freq: Once | SUBCUTANEOUS | Status: AC
  Administered 2019-02-08: 120 mg via SUBCUTANEOUS

## 2019-02-08 MED ORDER — DENOSUMAB 120 MG/1.7ML ~~LOC~~ SOLN
SUBCUTANEOUS | Status: AC
  Filled 2019-02-08: qty 1.7

## 2019-02-08 MED ORDER — INFLUENZA VAC A&B SA ADJ QUAD 0.5 ML IM PRSY
PREFILLED_SYRINGE | INTRAMUSCULAR | Status: AC
  Filled 2019-02-08: qty 0.5

## 2019-02-08 MED ORDER — FAMOTIDINE IN NACL 20-0.9 MG/50ML-% IV SOLN
20.0000 mg | Freq: Once | INTRAVENOUS | Status: AC
  Administered 2019-02-08: 11:00:00 20 mg via INTRAVENOUS

## 2019-02-08 MED ORDER — ACETAMINOPHEN 325 MG PO TABS
650.0000 mg | ORAL_TABLET | Freq: Once | ORAL | Status: AC
  Administered 2019-02-08: 650 mg via ORAL

## 2019-02-08 MED ORDER — MONTELUKAST SODIUM 10 MG PO TABS
10.0000 mg | ORAL_TABLET | Freq: Once | ORAL | Status: AC
  Administered 2019-02-08: 11:00:00 10 mg via ORAL

## 2019-02-08 MED ORDER — DIPHENHYDRAMINE HCL 25 MG PO CAPS
ORAL_CAPSULE | ORAL | Status: AC
  Filled 2019-02-08: qty 2

## 2019-02-08 MED ORDER — FAMOTIDINE IN NACL 20-0.9 MG/50ML-% IV SOLN
INTRAVENOUS | Status: AC
  Filled 2019-02-08: qty 50

## 2019-02-08 MED ORDER — INFLUENZA VAC A&B SA ADJ QUAD 0.5 ML IM PRSY: 0.5000 mL | PREFILLED_SYRINGE | Freq: Once | INTRAMUSCULAR | Status: DC

## 2019-02-08 MED ORDER — SODIUM CHLORIDE 0.9 % IV SOLN
20.0000 mg | Freq: Once | INTRAVENOUS | Status: AC
  Administered 2019-02-08: 12:00:00 20 mg via INTRAVENOUS
  Filled 2019-02-08: qty 20

## 2019-02-08 MED ORDER — ACETAMINOPHEN 325 MG PO TABS
ORAL_TABLET | ORAL | Status: AC
  Filled 2019-02-08: qty 2

## 2019-02-08 MED ORDER — MONTELUKAST SODIUM 10 MG PO TABS
ORAL_TABLET | ORAL | Status: AC
  Filled 2019-02-08: qty 1

## 2019-02-08 MED ORDER — SODIUM CHLORIDE 0.9 % IV SOLN
1600.0000 mg | Freq: Once | INTRAVENOUS | Status: AC
  Administered 2019-02-08: 1600 mg via INTRAVENOUS
  Filled 2019-02-08: qty 80

## 2019-02-08 MED ORDER — SODIUM CHLORIDE 0.9 % IV SOLN
Freq: Once | INTRAVENOUS | Status: AC
  Administered 2019-02-08: 11:00:00 via INTRAVENOUS
  Filled 2019-02-08: qty 250

## 2019-02-08 MED ORDER — INFLUENZA VAC A&B SA ADJ QUAD 0.5 ML IM PRSY
0.5000 mL | PREFILLED_SYRINGE | Freq: Once | INTRAMUSCULAR | Status: AC
  Administered 2019-02-08: 12:00:00 0.5 mL via INTRAMUSCULAR

## 2019-02-08 MED ORDER — DIPHENHYDRAMINE HCL 25 MG PO CAPS
50.0000 mg | ORAL_CAPSULE | Freq: Once | ORAL | Status: AC
  Administered 2019-02-08: 11:00:00 50 mg via ORAL

## 2019-02-08 NOTE — Patient Instructions (Signed)
[facility] Discharge Instructions for Patients Receiving Chemotherapy  Today you received the following chemotherapy agents: Darzalex  To help prevent nausea and vomiting after your treatment, we encourage you to take your nausea medication as directed.    If you develop nausea and vomiting that is not controlled by your nausea medication, call the clinic.   BELOW ARE SYMPTOMS THAT SHOULD BE REPORTED IMMEDIATELY:  *FEVER GREATER THAN 100.5 F  *CHILLS WITH OR WITHOUT FEVER  NAUSEA AND VOMITING THAT IS NOT CONTROLLED WITH YOUR NAUSEA MEDICATION  *UNUSUAL SHORTNESS OF BREATH  *UNUSUAL BRUISING OR BLEEDING  TENDERNESS IN MOUTH AND THROAT WITH OR WITHOUT PRESENCE OF ULCERS  *URINARY PROBLEMS  *BOWEL PROBLEMS  UNUSUAL RASH Items with * indicate a potential emergency and should be followed up as soon as possible.  Feel free to call the clinic should you have any questions or concerns. The clinic phone number is [phone].  Please show the CHEMO ALERT CARD at check-in to the Emergency Department and triage nurse.   

## 2019-02-09 ENCOUNTER — Telehealth: Payer: Self-pay | Admitting: Hematology

## 2019-02-09 NOTE — Telephone Encounter (Signed)
Scheduled appt per 9/28 los. °

## 2019-02-10 LAB — MULTIPLE MYELOMA PANEL, SERUM
Albumin SerPl Elph-Mcnc: 3.5 g/dL (ref 2.9–4.4)
Albumin/Glob SerPl: 1.5 (ref 0.7–1.7)
Alpha 1: 0.2 g/dL (ref 0.0–0.4)
Alpha2 Glob SerPl Elph-Mcnc: 0.9 g/dL (ref 0.4–1.0)
B-Globulin SerPl Elph-Mcnc: 0.9 g/dL (ref 0.7–1.3)
Gamma Glob SerPl Elph-Mcnc: 0.5 g/dL (ref 0.4–1.8)
Globulin, Total: 2.5 g/dL (ref 2.2–3.9)
IgA: 149 mg/dL (ref 61–437)
IgG (Immunoglobin G), Serum: 569 mg/dL — ABNORMAL LOW (ref 603–1613)
IgM (Immunoglobulin M), Srm: 47 mg/dL (ref 15–143)
Total Protein ELP: 6 g/dL (ref 6.0–8.5)

## 2019-03-08 ENCOUNTER — Inpatient Hospital Stay (HOSPITAL_BASED_OUTPATIENT_CLINIC_OR_DEPARTMENT_OTHER): Payer: Medicare Other | Admitting: Hematology

## 2019-03-08 ENCOUNTER — Inpatient Hospital Stay: Payer: Medicare Other

## 2019-03-08 ENCOUNTER — Inpatient Hospital Stay: Payer: Medicare Other | Attending: Hematology

## 2019-03-08 ENCOUNTER — Other Ambulatory Visit: Payer: Medicare Other

## 2019-03-08 ENCOUNTER — Other Ambulatory Visit: Payer: Self-pay

## 2019-03-08 ENCOUNTER — Telehealth: Payer: Self-pay | Admitting: Hematology

## 2019-03-08 VITALS — BP 128/63 | HR 61 | Temp 97.9°F | Resp 16

## 2019-03-08 VITALS — BP 160/82 | HR 71 | Temp 98.2°F | Resp 17 | Ht 71.0 in | Wt 217.5 lb

## 2019-03-08 DIAGNOSIS — Z23 Encounter for immunization: Secondary | ICD-10-CM | POA: Diagnosis not present

## 2019-03-08 DIAGNOSIS — C7951 Secondary malignant neoplasm of bone: Secondary | ICD-10-CM | POA: Insufficient documentation

## 2019-03-08 DIAGNOSIS — Z950 Presence of cardiac pacemaker: Secondary | ICD-10-CM | POA: Diagnosis not present

## 2019-03-08 DIAGNOSIS — M545 Low back pain: Secondary | ICD-10-CM | POA: Diagnosis not present

## 2019-03-08 DIAGNOSIS — Z7982 Long term (current) use of aspirin: Secondary | ICD-10-CM | POA: Diagnosis not present

## 2019-03-08 DIAGNOSIS — Z5112 Encounter for antineoplastic immunotherapy: Secondary | ICD-10-CM | POA: Diagnosis not present

## 2019-03-08 DIAGNOSIS — R630 Anorexia: Secondary | ICD-10-CM | POA: Insufficient documentation

## 2019-03-08 DIAGNOSIS — Z9181 History of falling: Secondary | ICD-10-CM | POA: Insufficient documentation

## 2019-03-08 DIAGNOSIS — C9000 Multiple myeloma not having achieved remission: Secondary | ICD-10-CM

## 2019-03-08 DIAGNOSIS — Z87891 Personal history of nicotine dependence: Secondary | ICD-10-CM | POA: Diagnosis not present

## 2019-03-08 DIAGNOSIS — R531 Weakness: Secondary | ICD-10-CM | POA: Diagnosis not present

## 2019-03-08 DIAGNOSIS — Z79899 Other long term (current) drug therapy: Secondary | ICD-10-CM | POA: Diagnosis not present

## 2019-03-08 DIAGNOSIS — I10 Essential (primary) hypertension: Secondary | ICD-10-CM | POA: Diagnosis not present

## 2019-03-08 DIAGNOSIS — Z7189 Other specified counseling: Secondary | ICD-10-CM

## 2019-03-08 LAB — CMP (CANCER CENTER ONLY)
ALT: 9 U/L (ref 0–44)
AST: 14 U/L — ABNORMAL LOW (ref 15–41)
Albumin: 3.6 g/dL (ref 3.5–5.0)
Alkaline Phosphatase: 69 U/L (ref 38–126)
Anion gap: 11 (ref 5–15)
BUN: 14 mg/dL (ref 8–23)
CO2: 25 mmol/L (ref 22–32)
Calcium: 8.8 mg/dL — ABNORMAL LOW (ref 8.9–10.3)
Chloride: 104 mmol/L (ref 98–111)
Creatinine: 1.11 mg/dL (ref 0.61–1.24)
GFR, Est AFR Am: >60 mL/min (ref >60)
GFR, Estimated: >60 mL/min (ref >60)
Glucose, Bld: 101 mg/dL — ABNORMAL HIGH (ref 70–99)
Potassium: 3.9 mmol/L (ref 3.5–5.1)
Sodium: 140 mmol/L (ref 135–145)
Total Bilirubin: 0.6 mg/dL (ref 0.3–1.2)
Total Protein: 6.5 g/dL (ref 6.5–8.1)

## 2019-03-08 LAB — CBC WITH DIFFERENTIAL/PLATELET
Abs Immature Granulocytes: 0.01 10*3/uL (ref 0.00–0.07)
Basophils Absolute: 0 10*3/uL (ref 0.0–0.1)
Basophils Relative: 1 %
Eosinophils Absolute: 0.5 10*3/uL (ref 0.0–0.5)
Eosinophils Relative: 9 %
HCT: 38.9 % — ABNORMAL LOW (ref 39.0–52.0)
Hemoglobin: 12.4 g/dL — ABNORMAL LOW (ref 13.0–17.0)
Immature Granulocytes: 0 %
Lymphocytes Relative: 45 %
Lymphs Abs: 2.6 10*3/uL (ref 0.7–4.0)
MCH: 27.2 pg (ref 26.0–34.0)
MCHC: 31.9 g/dL (ref 30.0–36.0)
MCV: 85.3 fL (ref 80.0–100.0)
Monocytes Absolute: 0.6 10*3/uL (ref 0.1–1.0)
Monocytes Relative: 11 %
Neutro Abs: 1.9 10*3/uL (ref 1.7–7.7)
Neutrophils Relative %: 34 %
Platelets: 142 10*3/uL — ABNORMAL LOW (ref 150–400)
RBC: 4.56 MIL/uL (ref 4.22–5.81)
RDW: 14.2 % (ref 11.5–15.5)
WBC: 5.7 10*3/uL (ref 4.0–10.5)
nRBC: 0 % (ref 0.0–0.2)

## 2019-03-08 MED ORDER — DENOSUMAB 120 MG/1.7ML ~~LOC~~ SOLN
120.0000 mg | Freq: Once | SUBCUTANEOUS | Status: AC
  Administered 2019-03-08: 120 mg via SUBCUTANEOUS

## 2019-03-08 MED ORDER — ACETAMINOPHEN 325 MG PO TABS
650.0000 mg | ORAL_TABLET | Freq: Once | ORAL | Status: AC
  Administered 2019-03-08: 650 mg via ORAL

## 2019-03-08 MED ORDER — SODIUM CHLORIDE 0.9 % IV SOLN
Freq: Once | INTRAVENOUS | Status: AC
  Administered 2019-03-08: 12:00:00 via INTRAVENOUS
  Filled 2019-03-08: qty 250

## 2019-03-08 MED ORDER — FAMOTIDINE IN NACL 20-0.9 MG/50ML-% IV SOLN
20.0000 mg | Freq: Once | INTRAVENOUS | Status: AC
  Administered 2019-03-08: 20 mg via INTRAVENOUS

## 2019-03-08 MED ORDER — ACETAMINOPHEN 325 MG PO TABS
ORAL_TABLET | ORAL | Status: AC
  Filled 2019-03-08: qty 2

## 2019-03-08 MED ORDER — SODIUM CHLORIDE 0.9 % IV SOLN
20.0000 mg | Freq: Once | INTRAVENOUS | Status: AC
  Administered 2019-03-08: 20 mg via INTRAVENOUS
  Filled 2019-03-08: qty 20

## 2019-03-08 MED ORDER — SODIUM CHLORIDE 0.9 % IV SOLN
16.4000 mg/kg | Freq: Once | INTRAVENOUS | Status: AC
  Administered 2019-03-08: 1600 mg via INTRAVENOUS
  Filled 2019-03-08: qty 80

## 2019-03-08 MED ORDER — DIPHENHYDRAMINE HCL 25 MG PO CAPS
50.0000 mg | ORAL_CAPSULE | Freq: Once | ORAL | Status: AC
  Administered 2019-03-08: 50 mg via ORAL

## 2019-03-08 MED ORDER — MONTELUKAST SODIUM 10 MG PO TABS
10.0000 mg | ORAL_TABLET | Freq: Once | ORAL | Status: AC
  Administered 2019-03-08: 10 mg via ORAL

## 2019-03-08 MED ORDER — DIPHENHYDRAMINE HCL 25 MG PO CAPS
ORAL_CAPSULE | ORAL | Status: AC
  Filled 2019-03-08: qty 2

## 2019-03-08 MED ORDER — FAMOTIDINE IN NACL 20-0.9 MG/50ML-% IV SOLN
INTRAVENOUS | Status: AC
  Filled 2019-03-08: qty 50

## 2019-03-08 MED ORDER — MONTELUKAST SODIUM 10 MG PO TABS
ORAL_TABLET | ORAL | Status: AC
  Filled 2019-03-08: qty 1

## 2019-03-08 MED ORDER — PNEUMOCOCCAL 13-VAL CONJ VACC IM SUSP
0.5000 mL | Freq: Once | INTRAMUSCULAR | Status: AC
  Administered 2019-03-08: 0.5 mL via INTRAMUSCULAR
  Filled 2019-03-08: qty 0.5

## 2019-03-08 MED ORDER — DENOSUMAB 120 MG/1.7ML ~~LOC~~ SOLN
SUBCUTANEOUS | Status: AC
  Filled 2019-03-08: qty 1.7

## 2019-03-08 NOTE — Telephone Encounter (Signed)
Per 10/26 los appts already scheduled.

## 2019-03-08 NOTE — Progress Notes (Signed)
HEMATOLOGY/ONCOLOGY CLINIC NOTE  Date of Service: 03/08/2019  Patient Care Team: [name], MD as PCP - General (Family Medicine) [name], MD (Cardiology) [name], MD (Cardiology) [name], DO as Consulting Physician (Neurology)  CHIEF COMPLAINTS/PURPOSE OF CONSULTATION:  Continue mx of myeloma  HISTORY OF PRESENTING ILLNESS:   Jonathon [name] is a wonderful 83 y.o. male who has been referred to [place] by Dr. [name] for evaluation and management of Lytic bone lesions. He is accompanied today by his daughter. The pt reports that he is doing well overall. He is currently a resident at [facility] in [place], [place].   The pt presented to the ED on 03/24/18 with a right distal clavicle fracture, which occurred after trying to get out of bed and pushing himself up with his right arm. He notes that his energy has been noticeably decreased for the last month. He also notes that his lower back has a slight sting when trying to stand up from sitting. He also notes some pain in his lower, right chest wall, which is exacerbated when coughing. The pt has been taking 53m Tramadol which is controlling his pain. He does endorse some weakening of both of his lower extremities in the past 1-2 months, which he attributes somewhat to his recent lower back pain.   The pt denies any bowel abnormalities recently. He notes that he stays very well hydrated, and urinates 4-5 times a night, and does have BPH, and takes Flomax. The pt denies any recent changes in his urination habits.   The pt notes that his appetite decreased for a few months, until he began assisted living. He has lost 9 pounds in the last week.   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 He now uses a walker to ambulate. He notes that the CIDP symptoms are primarily located to his legs.  He sees Dr. [name] Neurology. He took steroids without any symptomatic relief. IVIG was cost-prohibitive for the patient, but was recommended.    Regarding the patient's anemia in April 2019, he denies any obvious bleeding at that time, and denies surgeries.    The pt notes that his last colonoscopy, in 2015, was not concerning, which is corroborated by chart review.   The pt and daughter deny any concerns for memory issues, and the daughter adds his memory is "elephant-like". They are not sure why Namenda is on his medication list.   The pt denies any recent dental procedures, or concerns for dental issues. He has maintained every 6 month dental cleanings for all of his life.   Of note prior to the patient's visit today, pt has had a CT C/A/P completed on 03/31/18 with results revealing IMPRESSION: 1. Lytic lesion throughout all visualized vertebra from the lower cervical spine through the sacrum. Epidural extension of tumor T2, T3 and L2 level. Right L3-4 foraminal extension tumor. 2. Lytic lesions involving majority of ribs. Multiple pathologic rib fractures with bony expansion/sub pleural extension of tumor at several levels. 3. Lytic lesions of the scapula, hips bilaterally and pelvis bilaterally. With further progression of tumor, patient may be at risk for pathologic fractures. Pathologic fracture right clavicle incompletely assessed. 4. Circumferential narrowing sigmoid colon. Question possibility of underlying mass (series 2, image 97). Alternatively this could represent muscular hypertrophy/peristalsis. 5. Gastric  antrum circumferential narrowing. Cannot exclude mass although this may be related to peristalsis. 6. No primary lung malignancy noted. 7. Matted aortic pulmonary window lymph nodes. 8. Circumferential urinary bladder wall thickening greater anterior dome region. Right lateral bladder diverticulum. 9. 5 mm low-density lesion pancreatic body (series 5, image 10) unchanged.  This is felt unlikely to be a primary malignancy. 10. Right parapelvic 2.2 cm cyst without significant change. Scattered low-density renal lesions bila2terally too small to adequately characterize. Some were present previously. Statistically these are likely cysts. 11. Stable adrenal gland hyperplasia. 12. Gynecomastia. 13. Prostate gland causes slight impression upon the bladder base. 14.  Aortic Atherosclerosis. 15. Sequential pacemaker in place. 16. Gallstones.  Most recent lab results (03/25/18) of CBC w/diff and CMP is as follows: all values are WNL except for RBC at 4.14, HGB at 11.3, HCT at 33.6, Chloride at 94, CO2 at 33, Glucose at 100, BUN at 25, Total Protein at 9.4, Albumin at 3.4, Calcium at 13.8, GFR at 58. 03/25/18 PSA, Total was normal at 1.2  On review of systems, pt reports positional lower back pain, lower right chest wall pain, recently decreased energy levels, relatively weaker appetite, weight loss, right clavicle pain, weakening of the lower extremities, moving his bowels well, and denies changes in urination, abdominal pains, changes in breathing, changes in bowel habits, pain along the spine, leg swelling, and any other symptoms.  Interval History:   Jonathon [name] returns today for management, evaluation, and C12D1 Daratumumab treatment of his Multiple Myeloma. The patient's last visit with [place] was on 02/08/2019. The pt reports that he is doing well overall.  The pt reports that he has been eating well and gaining weight. He has continued to do 40 minutes on his bike every morning, as he hasn't been able to get out of the house much lately. Pt has gotten his annual flu shot but is unsure of the last time he received his pneumonia vaccines. He has been wearing his compression socks and notices improved leg swelling.   Lab results today (03/08/19) of CBC w/diff and CMP is as follows: all values are WNL except for Hgb at 12.4, HCT at 38.9, PLTs at 142K, Glucose at 101, Calcium at 8.8,  AST at 14. 03/08/2019 MMP -- M spike not observed.  On review of systems, pt reports eating well, improved leg swelling and denies bone pain, back pain, bowel movement issues, infection issues, mouth sores and any other symptoms.    MEDICAL HISTORY:  Past Medical History:  Diagnosis Date   Asthma    BENIGN PROSTATIC HYPERTROPHY, HX OF 10/25/2008   Bone metastases ([place]) 04/14/2018   BRADYCARDIA 2005   Chronic diastolic congestive heart failure ([place]) 06/05/2016   CIDP (chronic inflammatory demyelinating polyneuropathy) ([place])    HYPERTENSION 11/21/2006   Iron deficiency anemia, unspecified 04/19/2013   Multiple myeloma ([place]) 04/29/2018   NEPHROLITHIASIS, HX OF 11/21/2006 and 10/15/2008   PACEMAKER, PERMANENT 2005   Gen change 2014 Medtronic Adaptic L dual-chamber pacemaker, serial #[id number]    SVT (supraventricular tachycardia) ([place])    TOBACCO ABUSE 10/25/2008   Quit 2012    SURGICAL HISTORY: Past Surgical History:  Procedure Laterality Date   COLONOSCOPY  2004,2009   PACEMAKER INSERTION  2005   PERMANENT PACEMAKER GENERATOR CHANGE N/A 06/19/2012   Procedure: PERMANENT PACEMAKER GENERATOR CHANGE;  Surgeon: [name], MD; Medtronic Adaptic L dual-chamber pacemaker, serial #[id number]     SKIN GRAFT Right 1962   wrist    SOCIAL  HISTORY: Social History   Socioeconomic History   Marital status: Divorced    Spouse name: Not on file   Number of children: 4   Years of education: Not on file   Highest education level: Not on file  Occupational History   Occupation: ENVIROMENTAL SERVICE    Employer: [employer]   Occupation: retired  Scientist, product/process development strain: Not on file   Food insecurity    Worry: Not on file    Inability: Not on Lexicographer needs    Medical: Not on file    Non-medical: Not on file  Tobacco Use   Smoking status: Former Smoker    Packs/day: 0.25    Years: 46.00    Pack years: 11.50     Types: Cigarettes    Start date: 03/16/1965    Quit date: 06/15/2010    Years since quitting: 8.7   Smokeless tobacco: Never Used   Tobacco comment: quit 3 years ago  Substance and Sexual Activity   Alcohol use: No    Alcohol/week: 0.0 standard drinks   Drug use: No   Sexual activity: Not on file  Lifestyle   Physical activity    Days per week: Not on file    Minutes per session: Not on file   Stress: Not on file  Relationships   Social connections    Talks on phone: Not on file    Gets together: Not on file    Attends religious service: Not on file    Active member of club or organization: Not on file    Attends meetings of clubs or organizations: Not on file    Relationship status: Not on file   Intimate partner violence    Fear of current or ex partner: Not on file    Emotionally abused: Not on file    Physically abused: Not on file    Forced sexual activity: Not on file  Other Topics Concern   Not on file  Social History Narrative   Has relocated from [place] in 2007. 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  Patient in a facility thru [facility]    FAMILY HISTORY: Family History  Problem Relation Age of Onset   Heart attack Father        Died, 59   Kidney disease Father    Parkinson's disease Mother        Died, 19   Breast cancer Sister        Living, 61   Diabetes type II Brother        Living, 66   Breast cancer Sister        Living, 79   Diabetes Daughter        Living, 28   Diabetes Son        Living, 12   Ovarian cancer Daughter    Colon cancer Neg Hx    Rectal cancer Neg Hx    Stomach cancer Neg Hx     ALLERGIES:  is allergic to lexapro [escitalopram oxalate].  MEDICATIONS:  Current Outpatient Medications  Medication Sig Dispense Refill   acyclovir (ZOVIRAX) 400 MG tablet Take 1 tablet (400 mg total) by mouth 2 (two) times daily. 60 tablet 11   aspirin EC 81 MG tablet Take 81 mg by mouth daily.     dexamethasone (DECADRON) 4 MG tablet 50m (5  tabs) with breakfast the day after each Daratumumab infusion 20 tablet 4   flecainide (TAMBOCOR) 50 MG tablet  Take 75 mg by mouth daily as needed.     furosemide (LASIX) 40 MG tablet Take 40 mg by mouth daily.     irbesartan (AVAPRO) 300 MG tablet Take 300 mg by mouth daily.     Melatonin 3 MG CAPS Take 3 mg by mouth at bedtime.     memantine (NAMENDA) 5 MG tablet Take 5 mg by mouth 2 (two) times daily.     metoprolol tartrate (LOPRESSOR) 25 MG tablet Take 1 tablet (25 mg total) by mouth 2 (two) times daily.     ondansetron (ZOFRAN) 8 MG tablet Take 1 tablet (8 mg total) by mouth 2 (two) times daily as needed (Nausea or vomiting). 30 tablet 1   oseltamivir (TAMIFLU) 75 MG capsule Take 1 capsule (75 mg total) by mouth 2 (two) times daily. 9 capsule 0   polyethylene glycol (MIRALAX / GLYCOLAX) packet Take 17 g by mouth daily. 14 each 0   prochlorperazine (COMPAZINE) 10 MG tablet Take 1 tablet (10 mg total) by mouth every 6 (six) hours as needed (Nausea or vomiting). 30 tablet 1   Saccharomyces boulardii (FLORASTOR PO) Take 250 mg by mouth 2 (two) times daily.     tamsulosin (FLOMAX) 0.4 MG CAPS capsule Take 2 capsules (0.8 mg total) by mouth daily. 90 capsule 0   traMADol (ULTRAM) 50 MG tablet Take 1 tablet( 50 mg total) by mouth every 8 hrs as needed for pain 90 tablet 2   vitamin B-12 (CYANOCOBALAMIN) 500 MCG tablet Take 1,000 mcg by mouth daily.     Vitamin D, Ergocalciferol, (DRISDOL) 50000 units CAPS capsule Take 50,000 Units by mouth every 7 (seven) days.     No current facility-administered medications for this visit.    Facility-Administered Medications Ordered in Other Visits  Medication Dose Route Frequency Provider Last Rate Last Dose   sodium chloride flush (NS) 0.9 % injection 10 mL  10 mL Intracatheter PRN Brunetta Genera, MD        REVIEW OF SYSTEMS:   A 10+ POINT REVIEW OF SYSTEMS WAS OBTAINED including neurology, dermatology, psychiatry, cardiac,  respiratory, lymph, extremities, GI, GU, Musculoskeletal, constitutional, breasts, reproductive, HEENT.  All pertinent positives are noted in the HPI.  All others are negative.   PHYSICAL EXAMINATION: ECOG PERFORMANCE STATUS: 2 - Symptomatic, <50% confined to bed  .BP (!) 160/82 (BP Location: Left Arm, Patient Position: Sitting) Comment: Dr Irene Limbo aware of bp   Pulse 71    Temp 98.2 F (36.8 C) (Oral)    Resp 17    Ht _0  (1.803 m)    Wt 217 lb 8 oz (98.7 kg)    SpO2 100%    BMI 30.34 kg/m   GENERAL:alert, in no acute distress and comfortable SKIN: no acute rashes, no significant lesions EYES: conjunctiva are pink and non-injected, sclera anicteric OROPHARYNX: MMM, no exudates, no oropharyngeal erythema or ulceration NECK: supple, no JVD LYMPH:  no palpable lymphadenopathy in the cervical, axillary or inguinal regions LUNGS: clear to auscultation b/l with normal respiratory effort HEART: regular rate & rhythm ABDOMEN:  normoactive bowel sounds , non tender, not distended. No palpable hepatosplenomegaly.  Extremity: no pedal edema  PSYCH: alert & oriented x 3 with fluent speech NEURO: no focal motor/sensory deficits   LABORATORY DATA:  I have reviewed the data as listed  . CBC Latest Ref Rng & Units 03/08/2019 02/08/2019 01/11/2019  WBC 4.0 - 10.5 K/uL 5.7 5.7 6.6  Hemoglobin 13.0 - 17.0 g/dL 12.4(L) 12.5(L) 12.6(L)  Hematocrit 39.0 - 52.0 % 38.9(L) 39.0 38.9(L)  Platelets 150 - 400 K/uL 142(L) 158 149(L)    . CMP Latest Ref Rng & Units 03/08/2019 02/08/2019 01/11/2019  Glucose 70 - 99 mg/dL 101(H) 108(H) 99  BUN 8 - 23 mg/dL _0 Creatinine 0.61 - 1.24 mg/dL 1.11 1.11 1.13  Sodium 135 - 145 mmol/L 140 141 140  Potassium 3.5 - 5.1 mmol/L 3.9 3.9 4.0  Chloride 98 - 111 mmol/L 104 108 107  CO2 22 - 32 mmol/L _1 Calcium 8.9 - 10.3 mg/dL 8.8(L) 8.7(L) 8.9  Total Protein 6.5 - 8.1 g/dL 6.5 6.5 6.7  Total Bilirubin 0.3 - 1.2 mg/dL 0.6 0.5 0.5  Alkaline Phos 38 - 126 U/L  69 66 64  AST 15 - 41 U/L 14(L) 16 18  ALT 0 - 44 U/L _2 12/14/2018 MMP    12/14/2018 MMP    04/17/18 BM Bx:    RADIOGRAPHIC STUDIES: I have personally reviewed the radiological images as listed and agreed with the findings in the report. No results found.  ASSESSMENT & PLAN:  83 y.o. male with  1. Lytic bone lesions - w/u consistent with newly diagnosed multiple myeloma Labs upon initial presentation from 04/14/18; hypercalcemic with Calcium at 13.8, renal function up form baseline with Creatinine at 1.48, HGB at 11.3. PSA was normal.   03/31/18 CT C/A/P revealed 1. Lytic lesion throughout all visualized vertebra from the lower cervical spine through the sacrum. Epidural extension of tumor T2, T3 and L2 level. Right L3-4 foraminal extension tumor. 2. Lytic lesions involving majority of ribs. Multiple pathologic rib fractures with bony expansion/sub pleural extension of tumor at several levels. 3. Lytic lesions of the scapula, hips bilaterally and pelvis bilaterally. With further progression of tumor, patient may be at risk for pathologic fractures. Pathologic fracture right clavicle incompletely assessed. 4. Circumferential narrowing sigmoid colon. Question possibility of underlying mass (series 2, image 97). Alternatively this could represent muscular hypertrophy/peristalsis. 5. Gastric antrum circumferential narrowing. Cannot exclude mass although this may be related to peristalsis. 6. No primary lung malignancy noted. 7. Matted aortic pulmonary window lymph nodes. 8. Circumferential urinary bladder wall thickening greater anterior dome region. Right lateral bladder diverticulum. 9. 5 mm low-density lesion pancreatic body (series 5, image 10) unchanged. This is felt unlikely to be a primary malignancy. 10. Right parapelvic 2.2 cm cyst without significant change. Scattered low-density renal lesions bilaterally too small to adequately characterize. Some were present previously.  Statistically these are likely cysts. 11. Stable adrenal gland hyperplasia. 12. Gynecomastia. 13. Prostate gland causes slight impression upon the bladder base. 14.  Aortic Atherosclerosis. 15. Sequential pacemaker in place. 16. Gallstones.   04/23/18 PET/CT revealed Innumerable hypermetabolic lytic lesions throughout the skeleton especially involving the spine, ribs, bony pelvis along with some involvement of both proximal femurs. The appearance is compatible with pathologic diagnosis of plasma cell neoplasm could well reflect multiple myeloma. Resulting pathologic fractures of the right lateral clavicle and of multiple bilateral ribs. Lesions are present along the cortical margins of the spinal canal. 2. Both the area and the stomach in the area in the sigmoid colon drawn attention to on prior CT scan appear benign normal today. 3. The confluence of AP window lymph nodes measures about 1.2 cm in short axis with maximum SUV 3.1, which is mildly above the blood pool merit surveillance. 4. Other imaging findings of potential clinical significance: Aortic Atherosclerosis. Coronary atherosclerosis. Pacemaker noted. Borderline  prostatomegaly. Cholelithiasis.  04/21/18 BM Bx revealed Normocellular bone marrow with Plasma Cell neoplasm. Scattered medium sized clusters of kappa-restricted plasma cells (14% aspirate, 10% CD138 immunohistochemistry.   I do suspect that the burden of disease is underestimated in the bone marrow sample given PET/CT findings of innumerable lytic lesions and an M spike of 3.4g   06/08/18 DG Hips bilat which revealed Innumerable lucencies are noted throughout the pelvis and proximal femurs, similar findings noted on prior PET-CT of 04/23/2018. Findings are consistent patient's known multiple myeloma. Femoral necks are intact. 2.  Aortoiliac atherosclerotic vascular disease.  2. Pathologic right clavicle fracture due to myeloma- resolved 3. Hypercalcemia- resolved.  PLAN:    -Discussed  pt labwork today, 03/08/19; all values are WNL except for Hgb at 12.4, HCT at 38.9, PLTs at 142K, Glucose at 101, Calcium at 8.8, AST at 14. -Discussed 02/08/2019 MMP is as follows: all values are WNL except for IgG at 569. No M protein observed.  -Will give Prevnar today after treatment, Pneumovax in 2 months. -The pt has no prohibitive toxicities from continuing C12D1 Daratumumab at this time.  -Will continue maintenance monthly Daratumumab until progression or intolerance -CIDP has been considered in the treatment choice-- remains stable/improved with Daratumumab -Continue Xgeva every 4 weeks -Will see back in 1 month   FOLLOW UP: RTC as per scheduled appointments for next 2 cycles of Daratumumab with labs and MD visits -Pneumovax in 2 months -Prevnar today after daratumumab -Continue Xgeva q4weeks   The total time spent in the appt was 25 minutes and more than 50% was on counseling and direct patient cares.  All of the patient's questions were answered with apparent satisfaction. The patient knows to call the clinic with any problems, questions or concerns.    Sullivan Lone MD [place] AAHIVMS [facility] Hematology/Oncology Physician [facility]  (Office):       [phone] (Work cell):  [phone] (Fax):           [phone]  03/08/2019 5:43 PM  I, Yevette Edwards, am acting as a scribe for Dr. Sullivan Lone.   .I have reviewed the above documentation for accuracy and completeness, and I agree with the above. Brunetta Genera MD

## 2019-03-08 NOTE — Patient Instructions (Signed)
[facility] Discharge Instructions for Patients Receiving Chemotherapy  Today you received the following chemotherapy agents :  Daratumumab.  To help prevent nausea and vomiting after your treatment, we encourage you to take your nausea medication as prescribed.   If you develop nausea and vomiting that is not controlled by your nausea medication, call the clinic.   BELOW ARE SYMPTOMS THAT SHOULD BE REPORTED IMMEDIATELY:  *FEVER GREATER THAN 100.5 F  *CHILLS WITH OR WITHOUT FEVER  NAUSEA AND VOMITING THAT IS NOT CONTROLLED WITH YOUR NAUSEA MEDICATION  *UNUSUAL SHORTNESS OF BREATH  *UNUSUAL BRUISING OR BLEEDING  TENDERNESS IN MOUTH AND THROAT WITH OR WITHOUT PRESENCE OF ULCERS  *URINARY PROBLEMS  *BOWEL PROBLEMS  UNUSUAL RASH Items with * indicate a potential emergency and should be followed up as soon as possible.  Feel free to call the clinic should you have any questions or concerns. The clinic phone number is [phone].  Please show the CHEMO ALERT CARD at check-in to the Emergency Department and triage nurse.   

## 2019-03-10 LAB — MULTIPLE MYELOMA PANEL, SERUM
Albumin SerPl Elph-Mcnc: 3.7 g/dL (ref 2.9–4.4)
Albumin/Glob SerPl: 1.6 (ref 0.7–1.7)
Alpha 1: 0.2 g/dL (ref 0.0–0.4)
Alpha2 Glob SerPl Elph-Mcnc: 0.8 g/dL (ref 0.4–1.0)
B-Globulin SerPl Elph-Mcnc: 1 g/dL (ref 0.7–1.3)
Gamma Glob SerPl Elph-Mcnc: 0.5 g/dL (ref 0.4–1.8)
Globulin, Total: 2.4 g/dL (ref 2.2–3.9)
IgA: 163 mg/dL (ref 61–437)
IgG (Immunoglobin G), Serum: 629 mg/dL (ref 603–1613)
IgM (Immunoglobulin M), Srm: 41 mg/dL (ref 15–143)
Total Protein ELP: 6.1 g/dL (ref 6.0–8.5)

## 2019-03-12 DIAGNOSIS — Z20828 Contact with and (suspected) exposure to other viral communicable diseases: Secondary | ICD-10-CM | POA: Diagnosis not present

## 2019-03-13 DIAGNOSIS — Z20828 Contact with and (suspected) exposure to other viral communicable diseases: Secondary | ICD-10-CM | POA: Diagnosis not present

## 2019-03-19 DIAGNOSIS — Z20828 Contact with and (suspected) exposure to other viral communicable diseases: Secondary | ICD-10-CM | POA: Diagnosis not present

## 2019-03-30 DIAGNOSIS — B351 Tinea unguium: Secondary | ICD-10-CM | POA: Diagnosis not present

## 2019-03-30 DIAGNOSIS — M79674 Pain in right toe(s): Secondary | ICD-10-CM | POA: Diagnosis not present

## 2019-03-30 DIAGNOSIS — M79675 Pain in left toe(s): Secondary | ICD-10-CM | POA: Diagnosis not present

## 2019-03-31 ENCOUNTER — Telehealth: Payer: Self-pay | Admitting: Hematology

## 2019-03-31 NOTE — Telephone Encounter (Signed)
[place] PAL 11/23 f/u cancelled patient will have lab/fu only 11/23. Left message re change with new time for 11/23 appointments from 9 am to 9:30 am.

## 2019-03-31 NOTE — Telephone Encounter (Signed)
Daughter returned call and confirmed 11/23 appointment time change and is aware patient will not see [facility] this day.

## 2019-04-05 ENCOUNTER — Ambulatory Visit: Payer: Medicare Other | Admitting: Hematology

## 2019-04-05 ENCOUNTER — Inpatient Hospital Stay: Payer: Medicare Other | Attending: Hematology

## 2019-04-05 ENCOUNTER — Other Ambulatory Visit: Payer: Self-pay

## 2019-04-05 ENCOUNTER — Inpatient Hospital Stay: Payer: Medicare Other

## 2019-04-05 VITALS — BP 135/77 | HR 60 | Temp 98.0°F | Resp 16 | Wt 214.8 lb

## 2019-04-05 DIAGNOSIS — C7951 Secondary malignant neoplasm of bone: Secondary | ICD-10-CM

## 2019-04-05 DIAGNOSIS — Z7189 Other specified counseling: Secondary | ICD-10-CM

## 2019-04-05 DIAGNOSIS — Z5112 Encounter for antineoplastic immunotherapy: Secondary | ICD-10-CM | POA: Insufficient documentation

## 2019-04-05 DIAGNOSIS — C9000 Multiple myeloma not having achieved remission: Secondary | ICD-10-CM | POA: Diagnosis not present

## 2019-04-05 LAB — CBC WITH DIFFERENTIAL/PLATELET
Abs Immature Granulocytes: 0 10*3/uL (ref 0.00–0.07)
Basophils Absolute: 0.1 10*3/uL (ref 0.0–0.1)
Basophils Relative: 1 %
Eosinophils Absolute: 0.4 10*3/uL (ref 0.0–0.5)
Eosinophils Relative: 7 %
HCT: 39.4 % (ref 39.0–52.0)
Hemoglobin: 12.6 g/dL — ABNORMAL LOW (ref 13.0–17.0)
Immature Granulocytes: 0 %
Lymphocytes Relative: 45 %
Lymphs Abs: 2.3 10*3/uL (ref 0.7–4.0)
MCH: 27.2 pg (ref 26.0–34.0)
MCHC: 32 g/dL (ref 30.0–36.0)
MCV: 85.1 fL (ref 80.0–100.0)
Monocytes Absolute: 0.5 10*3/uL (ref 0.1–1.0)
Monocytes Relative: 9 %
Neutro Abs: 2 10*3/uL (ref 1.7–7.7)
Neutrophils Relative %: 38 %
Platelets: 138 10*3/uL — ABNORMAL LOW (ref 150–400)
RBC: 4.63 MIL/uL (ref 4.22–5.81)
RDW: 14.5 % (ref 11.5–15.5)
WBC: 5.3 10*3/uL (ref 4.0–10.5)
nRBC: 0 % (ref 0.0–0.2)

## 2019-04-05 LAB — COMPREHENSIVE METABOLIC PANEL
ALT: 11 U/L (ref 0–44)
AST: 17 U/L (ref 15–41)
Albumin: 3.8 g/dL (ref 3.5–5.0)
Alkaline Phosphatase: 69 U/L (ref 38–126)
Anion gap: 10 (ref 5–15)
BUN: 17 mg/dL (ref 8–23)
CO2: 23 mmol/L (ref 22–32)
Calcium: 8.7 mg/dL — ABNORMAL LOW (ref 8.9–10.3)
Chloride: 109 mmol/L (ref 98–111)
Creatinine, Ser: 1.14 mg/dL (ref 0.61–1.24)
GFR calc Af Amer: >60 mL/min (ref >60)
GFR calc non Af Amer: 60 mL/min — ABNORMAL LOW (ref >60)
Glucose, Bld: 104 mg/dL — ABNORMAL HIGH (ref 70–99)
Potassium: 4.1 mmol/L (ref 3.5–5.1)
Sodium: 142 mmol/L (ref 135–145)
Total Bilirubin: 0.8 mg/dL (ref 0.3–1.2)
Total Protein: 6.7 g/dL (ref 6.5–8.1)

## 2019-04-05 MED ORDER — ACETAMINOPHEN 325 MG PO TABS
650.0000 mg | ORAL_TABLET | Freq: Once | ORAL | Status: AC
  Administered 2019-04-05: 650 mg via ORAL

## 2019-04-05 MED ORDER — SODIUM CHLORIDE 0.9 % IV SOLN
Freq: Once | INTRAVENOUS | Status: AC
  Administered 2019-04-05: 11:00:00 via INTRAVENOUS
  Filled 2019-04-05: qty 250

## 2019-04-05 MED ORDER — DIPHENHYDRAMINE HCL 25 MG PO CAPS
ORAL_CAPSULE | ORAL | Status: AC
  Filled 2019-04-05: qty 2

## 2019-04-05 MED ORDER — FAMOTIDINE IN NACL 20-0.9 MG/50ML-% IV SOLN
20.0000 mg | Freq: Once | INTRAVENOUS | Status: AC
  Administered 2019-04-05: 20 mg via INTRAVENOUS

## 2019-04-05 MED ORDER — DENOSUMAB 120 MG/1.7ML ~~LOC~~ SOLN
120.0000 mg | Freq: Once | SUBCUTANEOUS | Status: AC
  Administered 2019-04-05: 120 mg via SUBCUTANEOUS

## 2019-04-05 MED ORDER — MONTELUKAST SODIUM 10 MG PO TABS
10.0000 mg | ORAL_TABLET | Freq: Once | ORAL | Status: AC
  Administered 2019-04-05: 10 mg via ORAL

## 2019-04-05 MED ORDER — FAMOTIDINE IN NACL 20-0.9 MG/50ML-% IV SOLN
INTRAVENOUS | Status: AC
  Filled 2019-04-05: qty 50

## 2019-04-05 MED ORDER — SODIUM CHLORIDE 0.9 % IV SOLN
20.0000 mg | Freq: Once | INTRAVENOUS | Status: AC
  Administered 2019-04-05: 11:00:00 20 mg via INTRAVENOUS
  Filled 2019-04-05: qty 20

## 2019-04-05 MED ORDER — DENOSUMAB 120 MG/1.7ML ~~LOC~~ SOLN
SUBCUTANEOUS | Status: AC
  Filled 2019-04-05: qty 1.7

## 2019-04-05 MED ORDER — SODIUM CHLORIDE 0.9 % IV SOLN
16.4000 mg/kg | Freq: Once | INTRAVENOUS | Status: AC
  Administered 2019-04-05: 12:00:00 1600 mg via INTRAVENOUS
  Filled 2019-04-05: qty 80

## 2019-04-05 MED ORDER — DIPHENHYDRAMINE HCL 25 MG PO CAPS
50.0000 mg | ORAL_CAPSULE | Freq: Once | ORAL | Status: AC
  Administered 2019-04-05: 50 mg via ORAL

## 2019-04-05 MED ORDER — ACETAMINOPHEN 325 MG PO TABS
ORAL_TABLET | ORAL | Status: AC
  Filled 2019-04-05: qty 2

## 2019-04-05 MED ORDER — MONTELUKAST SODIUM 10 MG PO TABS
ORAL_TABLET | ORAL | Status: AC
  Filled 2019-04-05: qty 1

## 2019-04-05 NOTE — Patient Instructions (Signed)
[facility] Discharge Instructions for Patients Receiving Chemotherapy  Today you received the following chemotherapy agents: Darzalex  To help prevent nausea and vomiting after your treatment, we encourage you to take your nausea medication as directed.    If you develop nausea and vomiting that is not controlled by your nausea medication, call the clinic.   BELOW ARE SYMPTOMS THAT SHOULD BE REPORTED IMMEDIATELY:  *FEVER GREATER THAN 100.5 F  *CHILLS WITH OR WITHOUT FEVER  NAUSEA AND VOMITING THAT IS NOT CONTROLLED WITH YOUR NAUSEA MEDICATION  *UNUSUAL SHORTNESS OF BREATH  *UNUSUAL BRUISING OR BLEEDING  TENDERNESS IN MOUTH AND THROAT WITH OR WITHOUT PRESENCE OF ULCERS  *URINARY PROBLEMS  *BOWEL PROBLEMS  UNUSUAL RASH Items with * indicate a potential emergency and should be followed up as soon as possible.  Feel free to call the clinic should you have any questions or concerns. The clinic phone number is [phone].  Please show the CHEMO ALERT CARD at check-in to the Emergency Department and triage nurse.   

## 2019-04-05 NOTE — Progress Notes (Signed)
Ok to treat with calcium 8.7 today per Dr. [name]

## 2019-04-07 LAB — MULTIPLE MYELOMA PANEL, SERUM
Albumin SerPl Elph-Mcnc: 3.4 g/dL (ref 2.9–4.4)
Albumin/Glob SerPl: 1.4 (ref 0.7–1.7)
Alpha 1: 0.2 g/dL (ref 0.0–0.4)
Alpha2 Glob SerPl Elph-Mcnc: 0.8 g/dL (ref 0.4–1.0)
B-Globulin SerPl Elph-Mcnc: 1 g/dL (ref 0.7–1.3)
Gamma Glob SerPl Elph-Mcnc: 0.6 g/dL (ref 0.4–1.8)
Globulin, Total: 2.6 g/dL (ref 2.2–3.9)
IgA: 178 mg/dL (ref 61–437)
IgG (Immunoglobin G), Serum: 721 mg/dL (ref 603–1613)
IgM (Immunoglobulin M), Srm: 50 mg/dL (ref 15–143)
Total Protein ELP: 6 g/dL (ref 6.0–8.5)

## 2019-05-03 ENCOUNTER — Inpatient Hospital Stay: Payer: Medicare Other

## 2019-05-03 ENCOUNTER — Other Ambulatory Visit: Payer: Self-pay

## 2019-05-03 ENCOUNTER — Inpatient Hospital Stay: Payer: Medicare Other | Attending: Hematology

## 2019-05-03 ENCOUNTER — Inpatient Hospital Stay (HOSPITAL_BASED_OUTPATIENT_CLINIC_OR_DEPARTMENT_OTHER): Payer: Medicare Other | Admitting: Hematology

## 2019-05-03 VITALS — BP 143/83 | HR 62 | Temp 98.1°F | Resp 18

## 2019-05-03 VITALS — BP 166/89 | HR 64 | Temp 98.7°F | Resp 17 | Ht 71.0 in | Wt 215.7 lb

## 2019-05-03 DIAGNOSIS — C9000 Multiple myeloma not having achieved remission: Secondary | ICD-10-CM | POA: Insufficient documentation

## 2019-05-03 DIAGNOSIS — C7951 Secondary malignant neoplasm of bone: Secondary | ICD-10-CM

## 2019-05-03 DIAGNOSIS — R0789 Other chest pain: Secondary | ICD-10-CM | POA: Insufficient documentation

## 2019-05-03 DIAGNOSIS — Z87891 Personal history of nicotine dependence: Secondary | ICD-10-CM | POA: Diagnosis not present

## 2019-05-03 DIAGNOSIS — Z87311 Personal history of (healed) other pathological fracture: Secondary | ICD-10-CM | POA: Diagnosis not present

## 2019-05-03 DIAGNOSIS — R634 Abnormal weight loss: Secondary | ICD-10-CM | POA: Insufficient documentation

## 2019-05-03 DIAGNOSIS — Z23 Encounter for immunization: Secondary | ICD-10-CM

## 2019-05-03 DIAGNOSIS — M545 Low back pain: Secondary | ICD-10-CM | POA: Insufficient documentation

## 2019-05-03 DIAGNOSIS — I2510 Atherosclerotic heart disease of native coronary artery without angina pectoris: Secondary | ICD-10-CM | POA: Diagnosis not present

## 2019-05-03 DIAGNOSIS — Z7189 Other specified counseling: Secondary | ICD-10-CM

## 2019-05-03 DIAGNOSIS — C9001 Multiple myeloma in remission: Secondary | ICD-10-CM

## 2019-05-03 DIAGNOSIS — R351 Nocturia: Secondary | ICD-10-CM | POA: Insufficient documentation

## 2019-05-03 DIAGNOSIS — D649 Anemia, unspecified: Secondary | ICD-10-CM | POA: Insufficient documentation

## 2019-05-03 DIAGNOSIS — Z7982 Long term (current) use of aspirin: Secondary | ICD-10-CM | POA: Diagnosis not present

## 2019-05-03 DIAGNOSIS — N401 Enlarged prostate with lower urinary tract symptoms: Secondary | ICD-10-CM | POA: Insufficient documentation

## 2019-05-03 DIAGNOSIS — Z5112 Encounter for antineoplastic immunotherapy: Secondary | ICD-10-CM | POA: Diagnosis not present

## 2019-05-03 DIAGNOSIS — Z79899 Other long term (current) drug therapy: Secondary | ICD-10-CM | POA: Insufficient documentation

## 2019-05-03 DIAGNOSIS — Z7951 Long term (current) use of inhaled steroids: Secondary | ICD-10-CM | POA: Insufficient documentation

## 2019-05-03 LAB — CMP (CANCER CENTER ONLY)
ALT: 10 U/L (ref 0–44)
AST: 16 U/L (ref 15–41)
Albumin: 3.8 g/dL (ref 3.5–5.0)
Alkaline Phosphatase: 59 U/L (ref 38–126)
Anion gap: 10 (ref 5–15)
BUN: 19 mg/dL (ref 8–23)
CO2: 25 mmol/L (ref 22–32)
Calcium: 8.5 mg/dL — ABNORMAL LOW (ref 8.9–10.3)
Chloride: 107 mmol/L (ref 98–111)
Creatinine: 1.1 mg/dL (ref 0.61–1.24)
GFR, Est AFR Am: >60 mL/min (ref >60)
GFR, Estimated: >60 mL/min (ref >60)
Glucose, Bld: 100 mg/dL — ABNORMAL HIGH (ref 70–99)
Potassium: 3.7 mmol/L (ref 3.5–5.1)
Sodium: 142 mmol/L (ref 135–145)
Total Bilirubin: 0.7 mg/dL (ref 0.3–1.2)
Total Protein: 6.7 g/dL (ref 6.5–8.1)

## 2019-05-03 LAB — CBC WITH DIFFERENTIAL/PLATELET
Abs Immature Granulocytes: 0.01 10*3/uL (ref 0.00–0.07)
Basophils Absolute: 0.1 10*3/uL (ref 0.0–0.1)
Basophils Relative: 1 %
Eosinophils Absolute: 0.4 10*3/uL (ref 0.0–0.5)
Eosinophils Relative: 6 %
HCT: 39.8 % (ref 39.0–52.0)
Hemoglobin: 12.7 g/dL — ABNORMAL LOW (ref 13.0–17.0)
Immature Granulocytes: 0 %
Lymphocytes Relative: 44 %
Lymphs Abs: 2.8 10*3/uL (ref 0.7–4.0)
MCH: 27.5 pg (ref 26.0–34.0)
MCHC: 31.9 g/dL (ref 30.0–36.0)
MCV: 86.1 fL (ref 80.0–100.0)
Monocytes Absolute: 0.5 10*3/uL (ref 0.1–1.0)
Monocytes Relative: 8 %
Neutro Abs: 2.6 10*3/uL (ref 1.7–7.7)
Neutrophils Relative %: 41 %
Platelets: 142 10*3/uL — ABNORMAL LOW (ref 150–400)
RBC: 4.62 MIL/uL (ref 4.22–5.81)
RDW: 14.7 % (ref 11.5–15.5)
WBC: 6.4 10*3/uL (ref 4.0–10.5)
nRBC: 0 % (ref 0.0–0.2)

## 2019-05-03 MED ORDER — DIPHENHYDRAMINE HCL 25 MG PO CAPS
ORAL_CAPSULE | ORAL | Status: AC
  Filled 2019-05-03: qty 2

## 2019-05-03 MED ORDER — ACETAMINOPHEN 325 MG PO TABS
ORAL_TABLET | ORAL | Status: AC
  Filled 2019-05-03: qty 2

## 2019-05-03 MED ORDER — SODIUM CHLORIDE 0.9 % IV SOLN
20.0000 mg | Freq: Once | INTRAVENOUS | Status: AC
  Administered 2019-05-03: 20 mg via INTRAVENOUS
  Filled 2019-05-03: qty 20

## 2019-05-03 MED ORDER — SODIUM CHLORIDE 0.9 % IV SOLN
Freq: Once | INTRAVENOUS | Status: AC
  Filled 2019-05-03: qty 250

## 2019-05-03 MED ORDER — MONTELUKAST SODIUM 10 MG PO TABS
10.0000 mg | ORAL_TABLET | Freq: Once | ORAL | Status: AC
  Administered 2019-05-03: 10 mg via ORAL

## 2019-05-03 MED ORDER — ACETAMINOPHEN 325 MG PO TABS
650.0000 mg | ORAL_TABLET | Freq: Once | ORAL | Status: AC
  Administered 2019-05-03: 650 mg via ORAL

## 2019-05-03 MED ORDER — PNEUMOCOCCAL VAC POLYVALENT 25 MCG/0.5ML IJ INJ
0.5000 mL | INJECTION | Freq: Once | INTRAMUSCULAR | Status: AC
  Administered 2019-05-03: 0.5 mL via INTRAMUSCULAR
  Filled 2019-05-03: qty 0.5

## 2019-05-03 MED ORDER — DENOSUMAB 120 MG/1.7ML ~~LOC~~ SOLN
120.0000 mg | Freq: Once | SUBCUTANEOUS | Status: AC
  Administered 2019-05-03: 120 mg via SUBCUTANEOUS

## 2019-05-03 MED ORDER — DIPHENHYDRAMINE HCL 25 MG PO CAPS
50.0000 mg | ORAL_CAPSULE | Freq: Once | ORAL | Status: AC
  Administered 2019-05-03: 50 mg via ORAL

## 2019-05-03 MED ORDER — PNEUMOCOCCAL VAC POLYVALENT 25 MCG/0.5ML IJ INJ
0.5000 mL | INJECTION | Freq: Once | INTRAMUSCULAR | Status: DC
  Filled 2019-05-03: qty 0.5

## 2019-05-03 MED ORDER — MONTELUKAST SODIUM 10 MG PO TABS
ORAL_TABLET | ORAL | Status: AC
  Filled 2019-05-03: qty 1

## 2019-05-03 MED ORDER — FAMOTIDINE IN NACL 20-0.9 MG/50ML-% IV SOLN
INTRAVENOUS | Status: AC
  Filled 2019-05-03: qty 50

## 2019-05-03 MED ORDER — DENOSUMAB 120 MG/1.7ML ~~LOC~~ SOLN
SUBCUTANEOUS | Status: AC
  Filled 2019-05-03: qty 1.7

## 2019-05-03 MED ORDER — FAMOTIDINE IN NACL 20-0.9 MG/50ML-% IV SOLN
20.0000 mg | Freq: Once | INTRAVENOUS | Status: AC
  Administered 2019-05-03: 20 mg via INTRAVENOUS

## 2019-05-03 MED ORDER — SODIUM CHLORIDE 0.9 % IV SOLN
16.4000 mg/kg | Freq: Once | INTRAVENOUS | Status: AC
  Administered 2019-05-03: 1600 mg via INTRAVENOUS
  Filled 2019-05-03: qty 80

## 2019-05-03 NOTE — Progress Notes (Signed)
HEMATOLOGY/ONCOLOGY CLINIC NOTE  Date of Service: 05/03/2019  Patient Care Team: [name], MD as PCP - General (Family Medicine) [name], MD (Cardiology) [name], MD (Cardiology) [name], DO as Consulting Physician (Neurology)  CHIEF COMPLAINTS/PURPOSE OF CONSULTATION:  Continue mx of myeloma  HISTORY OF PRESENTING ILLNESS:   Jonathon [name] is a wonderful 83 y.o. male who has been referred to [place] by Dr. [name] for evaluation and management of Lytic bone lesions. He is accompanied today by his daughter. The pt reports that he is doing well overall. He is currently a resident at [facility] in [place], [place].   The pt presented to the ED on 03/24/18 with a right distal clavicle fracture, which occurred after trying to get out of bed and pushing himself up with his right arm. He notes that his energy has been noticeably decreased for the last month. He also notes that his lower back has a slight sting when trying to stand up from sitting. He also notes some pain in his lower, right chest wall, which is exacerbated when coughing. The pt has been taking 61m Tramadol which is controlling his pain. He does endorse some weakening of both of his lower extremities in the past 1-2 months, which he attributes somewhat to his recent lower back pain.   The pt denies any bowel abnormalities recently. He notes that he stays very well hydrated, and urinates 4-5 times a night, and does have BPH, and takes Flomax. The pt denies any recent changes in his urination habits.   The pt notes that his appetite decreased for a few months, until he began assisted living. He has lost 9 pounds in the last week.   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 He now uses a walker to ambulate. He notes that the CIDP symptoms are primarily located to his legs.  He sees Dr. [name] Neurology. He took steroids without any symptomatic relief. IVIG was cost-prohibitive for the patient, but was recommended.    Regarding the patient's anemia in April 2019, he denies any obvious bleeding at that time, and denies surgeries.    The pt notes that his last colonoscopy, in 2015, was not concerning, which is corroborated by chart review.   The pt and daughter deny any concerns for memory issues, and the daughter adds his memory is "elephant-like". They are not sure why Namenda is on his medication list.   The pt denies any recent dental procedures, or concerns for dental issues. He has maintained every 6 month dental cleanings for all of his life.   Of note prior to the patient's visit today, pt has had a CT C/A/P completed on 03/31/18 with results revealing IMPRESSION: 1. Lytic lesion throughout all visualized vertebra from the lower cervical spine through the sacrum. Epidural extension of tumor T2, T3 and L2 level. Right L3-4 foraminal extension tumor. 2. Lytic lesions involving majority of ribs. Multiple pathologic rib fractures with bony expansion/sub pleural extension of tumor at several levels. 3. Lytic lesions of the scapula, hips bilaterally and pelvis bilaterally. With further progression of tumor, patient may be at risk for pathologic fractures. Pathologic fracture right clavicle incompletely assessed. 4. Circumferential narrowing sigmoid colon. Question possibility of underlying mass (series 2, image 97). Alternatively this could represent muscular hypertrophy/peristalsis. 5. Gastric  antrum circumferential narrowing. Cannot exclude mass although this may be related to peristalsis. 6. No primary lung malignancy noted. 7. Matted aortic pulmonary window lymph nodes. 8. Circumferential urinary bladder wall thickening greater anterior dome region. Right lateral bladder diverticulum. 9. 5 mm low-density lesion pancreatic body (series 5, image 10) unchanged.  This is felt unlikely to be a primary malignancy. 10. Right parapelvic 2.2 cm cyst without significant change. Scattered low-density renal lesions bila2terally too small to adequately characterize. Some were present previously. Statistically these are likely cysts. 11. Stable adrenal gland hyperplasia. 12. Gynecomastia. 13. Prostate gland causes slight impression upon the bladder base. 14.  Aortic Atherosclerosis. 15. Sequential pacemaker in place. 16. Gallstones.  Most recent lab results (03/25/18) of CBC w/diff and CMP is as follows: all values are WNL except for RBC at 4.14, HGB at 11.3, HCT at 33.6, Chloride at 94, CO2 at 33, Glucose at 100, BUN at 25, Total Protein at 9.4, Albumin at 3.4, Calcium at 13.8, GFR at 58. 03/25/18 PSA, Total was normal at 1.2  On review of systems, pt reports positional lower back pain, lower right chest wall pain, recently decreased energy levels, relatively weaker appetite, weight loss, right clavicle pain, weakening of the lower extremities, moving his bowels well, and denies changes in urination, abdominal pains, changes in breathing, changes in bowel habits, pain along the spine, leg swelling, and any other symptoms.  Interval History:  Jonathon [name] returns today for management, evaluation, and C14D1 Daratumumab treatment of his Multiple Myeloma. The patient's last visit with [place] was on 03/08/2019. The pt reports that he is doing well overall.  The pt reports that he has been doing well and eating well. He has even gained a little weight. Pt notes that his blood pressures are fluctuating between normal and high but he has been watching them closely. Pt has been having some cold sensations in his fingers and toes and has been trying to keep them warm.   Lab results today (05/03/19) of CBC w/diff and CMP is as follows: all values are WNL except for Hgb at 12.7, PLTs at 142K, Glucose at 100, Calcium at 8.5. 05/03/2019 MMP is in progress  On review of systems, pt  reports cold sensation in fingers/toes and denies fevers, chills, night sweats, diarrhea, new bone pains, new back pain, unexpected weight loss and any other symptoms.   MEDICAL HISTORY:  Past Medical History:  Diagnosis Date  . Asthma   . BENIGN PROSTATIC HYPERTROPHY, HX OF 10/25/2008  . Bone metastases ([place]) 04/14/2018  . BRADYCARDIA 2005  . Chronic diastolic congestive heart failure ([place]) 06/05/2016  . CIDP (chronic inflammatory demyelinating polyneuropathy) ([place])   . HYPERTENSION 11/21/2006  . Iron deficiency anemia, unspecified 04/19/2013  . Multiple myeloma ([place]) 04/29/2018  . 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 PACEMAKER, PERMANENT 2005   Gen change 2014 Medtronic Adaptic L dual-chamber pacemaker, serial [id number]   . SVT (supraventricular tachycardia) ([facility])   . TOBACCO ABUSE 10/25/2008   Quit 2012    SURGICAL HISTORY: Past Surgical History:  Procedure Laterality Date  . COLONOSCOPY  [id number]  . PACEMAKER INSERTION  2005  . PERMANENT PACEMAKER GENERATOR CHANGE N/A 06/19/2012   Procedure: PERMANENT PACEMAKER GENERATOR CHANGE;  Surgeon: [name], MD; Medtronic Adaptic L dual-chamber pacemaker, serial #[id number]    . SKIN GRAFT Right 1962   wrist    SOCIAL HISTORY: Social History   Socioeconomic History  . Marital status: Divorced    Spouse name: Not  on file  . Number of children: 4  . Years of education: Not on file  . Highest education level: Not on file  Occupational History  . Occupation: ENVIROMENTAL Location manager: [employer]  . Occupation: retired  Tobacco Use  . Smoking status: Former Smoker    Packs/day: 0.25    Years: 46.00    Pack years: 11.50    Types: Cigarettes    Start date: 03/16/1965    Quit date: 06/15/2010    Years since quitting: 8.8  . Smokeless tobacco: Never Used  . Tobacco comment: quit 3 years ago  Substance and Sexual Activity  . Alcohol use: No    Alcohol/week: 0.0 standard drinks  . Drug use: No   . Sexual activity: Not on file  Other Topics Concern  . Not on file  Social History Narrative   Has relocated from [place] in 2007. 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  Patient in a facility thru [facility]   Social Determinants of Health   Financial Resource Strain:   . Difficulty of Paying Living Expenses: Not on file  Food Insecurity:   . Worried About Charity fundraiser in the Last Year: Not on file  . Ran Out of Food in the Last Year: Not on file  Transportation Needs:   . Lack of Transportation (Medical): Not on file  . Lack of Transportation (Non-Medical): Not on file  Physical Activity:   . Days of Exercise per Week: Not on file  . Minutes of Exercise per Session: Not on file  Stress:   . Feeling of Stress : Not on file  Social Connections:   . Frequency of Communication with Friends and Family: Not on file  . Frequency of Social Gatherings with Friends and Family: Not on file  . Attends Religious Services: Not on file  . Active Member of Clubs or Organizations: Not on file  . Attends Archivist Meetings: Not on file  . Marital Status: Not on file  Intimate Partner Violence:   . Fear of Current or Ex-Partner: Not on file  . Emotionally Abused: Not on file  . Physically Abused: Not on file  . Sexually Abused: Not on file    FAMILY HISTORY: Family History  Problem Relation Age of Onset  . Heart attack Father        Died, 38  . Kidney disease Father   . Parkinson's disease Mother        Died, 75  . Breast cancer Sister        Living, 58  . Diabetes type II Brother        Living, 7  . Breast cancer Sister        Living, 82  . Diabetes Daughter        Living, 1  . Diabetes Son        Living, 59  . Ovarian cancer Daughter   . Colon cancer Neg Hx   . Rectal cancer Neg Hx   . Stomach cancer Neg Hx     ALLERGIES:  is allergic to lexapro [escitalopram oxalate].  MEDICATIONS:  Current Outpatient Medications  Medication Sig Dispense Refill  . acyclovir (ZOVIRAX)  400 MG tablet Take 1 tablet (400 mg total) by mouth 2 (two) times daily. 60 tablet 11  . aspirin EC 81 MG tablet Take 81 mg by mouth daily.    Marland Kitchen dexamethasone (DECADRON) 4 MG tablet 63m (5 tabs) with breakfast the day after each  Daratumumab infusion 20 tablet 4  . flecainide (TAMBOCOR) 50 MG tablet Take 75 mg by mouth daily as needed.    . furosemide (LASIX) 40 MG tablet Take 40 mg by mouth daily.    . irbesartan (AVAPRO) 300 MG tablet Take 300 mg by mouth daily.    . Melatonin 3 MG CAPS Take 3 mg by mouth at bedtime.    . memantine (NAMENDA) 5 MG tablet Take 5 mg by mouth 2 (two) times daily.    . metoprolol tartrate (LOPRESSOR) 25 MG tablet Take 1 tablet (25 mg total) by mouth 2 (two) times daily.    . ondansetron (ZOFRAN) 8 MG tablet Take 1 tablet (8 mg total) by mouth 2 (two) times daily as needed (Nausea or vomiting). 30 tablet 1  . oseltamivir (TAMIFLU) 75 MG capsule Take 1 capsule (75 mg total) by mouth 2 (two) times daily. 9 capsule 0  . polyethylene glycol (MIRALAX / GLYCOLAX) packet Take 17 g by mouth daily. 14 each 0  . prochlorperazine (COMPAZINE) 10 MG tablet Take 1 tablet (10 mg total) by mouth every 6 (six) hours as needed (Nausea or vomiting). 30 tablet 1  . Saccharomyces boulardii (FLORASTOR PO) Take 250 mg by mouth 2 (two) times daily.    . tamsulosin (FLOMAX) 0.4 MG CAPS capsule Take 2 capsules (0.8 mg total) by mouth daily. 90 capsule 0  . traMADol (ULTRAM) 50 MG tablet Take 1 tablet( 50 mg total) by mouth every 8 hrs as needed for pain 90 tablet 2  . vitamin B-12 (CYANOCOBALAMIN) 500 MCG tablet Take 1,000 mcg by mouth daily.    . Vitamin D, Ergocalciferol, (DRISDOL) 50000 units CAPS capsule Take 50,000 Units by mouth every 7 (seven) days.     Current Facility-Administered Medications  Medication Dose Route Frequency Provider Last Rate Last Admin  . pneumococcal 23 valent vaccine (PNEUMOVAX-23) injection 0.5 mL  0.5 mL Intramuscular Once Brunetta Genera, MD        Facility-Administered Medications Ordered in Other Visits  Medication Dose Route Frequency Provider Last Rate Last Admin  . 0.9 %  sodium chloride infusion   Intravenous Once Brunetta Genera, MD      . acetaminophen (TYLENOL) tablet 650 mg  650 mg Oral Once Brunetta Genera, MD      . daratumumab [facility]) 1,600 mg in sodium chloride 0.9 % 420 mL chemo infusion  16.4 mg/kg (Order-Specific) Intravenous Once Brunetta Genera, MD      . dexamethasone (DECADRON) 20 mg in sodium chloride 0.9 % 50 mL IVPB  20 mg Intravenous Once Brunetta Genera, MD      . diphenhydrAMINE (BENADRYL) capsule 50 mg  50 mg Oral Once Brunetta Genera, MD      . famotidine (PEPCID) IVPB 20 mg premix  20 mg Intravenous Once Brunetta Genera, MD      . montelukast (SINGULAIR) tablet 10 mg  10 mg Oral Once Brunetta Genera, MD      . sodium chloride flush (NS) 0.9 % injection 10 mL  10 mL Intracatheter PRN Brunetta Genera, MD        REVIEW OF SYSTEMS:   A 10+ POINT REVIEW OF SYSTEMS WAS OBTAINED including neurology, dermatology, psychiatry, cardiac, respiratory, lymph, extremities, GI, GU, Musculoskeletal, constitutional, breasts, reproductive, HEENT.  All pertinent positives are noted in the HPI.  All others are negative.   PHYSICAL EXAMINATION: ECOG PERFORMANCE STATUS: 2 - Symptomatic, <50% confined to bed  .BP (!) 166/89 (  BP Location: Left Arm, Patient Position: Sitting)   Pulse 64   Temp 98.7 F (37.1 C) (Temporal)   Resp 17   Ht '5\' 11"'  (1.803 m)   Wt 215 lb 11.2 oz (97.8 kg)   SpO2 100%   BMI 30.08 kg/m   Exam was given in a chair   GENERAL:alert, in no acute distress and comfortable SKIN: no acute rashes, no significant lesions EYES: conjunctiva are pink and non-injected, sclera anicteric OROPHARYNX: MMM, no exudates, no oropharyngeal erythema or ulceration NECK: supple, no JVD LYMPH:  no palpable lymphadenopathy in the cervical, axillary or inguinal regions LUNGS:  clear to auscultation b/l with normal respiratory effort HEART: regular rate & rhythm ABDOMEN:  normoactive bowel sounds , non tender, not distended. No palpable hepatosplenomegaly.  Extremity: no pedal edema PSYCH: alert & oriented x 3 with fluent speech NEURO: no focal motor/sensory deficits  LABORATORY DATA:  I have reviewed the data as listed  . CBC Latest Ref Rng & Units 05/03/2019 04/05/2019 03/08/2019  WBC 4.0 - 10.5 K/uL 6.4 5.3 5.7  Hemoglobin 13.0 - 17.0 g/dL 12.7(L) 12.6(L) 12.4(L)  Hematocrit 39.0 - 52.0 % 39.8 39.4 38.9(L)  Platelets 150 - 400 K/uL 142(L) 138(L) 142(L)    . CMP Latest Ref Rng & Units 05/03/2019 04/05/2019 03/08/2019  Glucose 70 - 99 mg/dL 100(H) 104(H) 101(H)  BUN 8 - 23 mg/dL '19 17 14  ' Creatinine 0.61 - 1.24 mg/dL 1.10 1.14 1.11  Sodium 135 - 145 mmol/L 142 142 140  Potassium 3.5 - 5.1 mmol/L 3.7 4.1 3.9  Chloride 98 - 111 mmol/L 107 109 104  CO2 22 - 32 mmol/L '25 23 25  ' Calcium 8.9 - 10.3 mg/dL 8.5(L) 8.7(L) 8.8(L)  Total Protein 6.5 - 8.1 g/dL 6.7 6.7 6.5  Total Bilirubin 0.3 - 1.2 mg/dL 0.7 0.8 0.6  Alkaline Phos 38 - 126 U/L 59 69 69  AST 15 - 41 U/L 16 17 14(L)  ALT 0 - 44 U/L '10 11 9   ' 12/14/2018 MMP    12/14/2018 MMP    04/17/18 BM Bx:    RADIOGRAPHIC STUDIES: I have personally reviewed the radiological images as listed and agreed with the findings in the report. No results found.  ASSESSMENT & PLAN:  83 y.o. male with  1. Lytic bone lesions - w/u consistent with newly diagnosed multiple myeloma Labs upon initial presentation from 04/14/18; hypercalcemic with Calcium at 13.8, renal function up form baseline with Creatinine at 1.48, HGB at 11.3. PSA was normal.   03/31/18 CT C/A/P revealed 1. Lytic lesion throughout all visualized vertebra from the lower cervical spine through the sacrum. Epidural extension of tumor T2, T3 and L2 level. Right L3-4 foraminal extension tumor. 2. Lytic lesions involving majority of ribs. Multiple  pathologic rib fractures with bony expansion/sub pleural extension of tumor at several levels. 3. Lytic lesions of the scapula, hips bilaterally and pelvis bilaterally. With further progression of tumor, patient may be at risk for pathologic fractures. Pathologic fracture right clavicle incompletely assessed. 4. Circumferential narrowing sigmoid colon. Question possibility of underlying mass (series 2, image 97). Alternatively this could represent muscular hypertrophy/peristalsis. 5. Gastric antrum circumferential narrowing. Cannot exclude mass although this may be related to peristalsis. 6. No primary lung malignancy noted. 7. Matted aortic pulmonary window lymph nodes. 8. Circumferential urinary bladder wall thickening greater anterior dome region. Right lateral bladder diverticulum. 9. 5 mm low-density lesion pancreatic body (series 5, image 10) unchanged. This is felt unlikely to be a primary  malignancy. 10. Right parapelvic 2.2 cm cyst without significant change. Scattered low-density renal lesions bilaterally too small to adequately characterize. Some were present previously. Statistically these are likely cysts. 11. Stable adrenal gland hyperplasia. 12. Gynecomastia. 13. Prostate gland causes slight impression upon the bladder base. 14.  Aortic Atherosclerosis. 15. Sequential pacemaker in place. 16. Gallstones.   04/23/18 PET/CT revealed Innumerable hypermetabolic lytic lesions throughout the skeleton especially involving the spine, ribs, bony pelvis along with some involvement of both proximal femurs. The appearance is compatible with pathologic diagnosis of plasma cell neoplasm could well reflect multiple myeloma. Resulting pathologic fractures of the right lateral clavicle and of multiple bilateral ribs. Lesions are present along the cortical margins of the spinal canal. 2. Both the area and the stomach in the area in the sigmoid colon drawn attention to on prior CT scan appear benign normal today. 3.  The confluence of AP window lymph nodes measures about 1.2 cm in short axis with maximum SUV 3.1, which is mildly above the blood pool merit surveillance. 4. Other imaging findings of potential clinical significance: Aortic Atherosclerosis. Coronary atherosclerosis. Pacemaker noted. Borderline prostatomegaly. Cholelithiasis.  04/21/18 BM Bx revealed Normocellular bone marrow with Plasma Cell neoplasm. Scattered medium sized clusters of kappa-restricted plasma cells (14% aspirate, 10% CD138 immunohistochemistry.   I do suspect that the burden of disease is underestimated in the bone marrow sample given PET/CT findings of innumerable lytic lesions and an M spike of 3.4g   06/08/18 DG Hips bilat which revealed Innumerable lucencies are noted throughout the pelvis and proximal femurs, similar findings noted on prior PET-CT of 04/23/2018. Findings are consistent patient's known multiple myeloma. Femoral necks are intact. 2.  Aortoiliac atherosclerotic vascular disease.  2. Pathologic right clavicle fracture due to myeloma- resolved 3. Hypercalcemia- resolved.  PLAN:    -Discussed pt labwork today, 05/03/19; all values are WNL except for Hgb at 12.7, PLTs at 142K, Glucose at 100, Calcium at 8.5. -Discussed 05/03/2019 MMP is in progress -Discussed 04/05/2019 MMP shows no observed M Protein  -Discussed continuing maintenance Daratumumab until progression vs. continuing maintenance with another medication until progression vs. transplant vs. no treatment - just watching with labs and clinic visits -Would not recommend pt receive a transplant at this time -Due to pt being in remission and great response to treatment not unreasonable for pt to hold treatment at this time -Advised pt that continuing treatment carries an increased risk of infection and holding treatment carries a risk of shorter time to progression -Pt elects to continue his treatment with Daratumumab at this time -The pt has no prohibitive  toxicities from continuing C14D1 Daratumumab at this time.  -Will continue maintenance monthly Daratumumab until progression or intolerance -CIDP has been considered in the treatment choice-- remains stable/improved with Daratumumab -Continue Xgeva every 4 weeks -Will give pt Pneumovax today after treatment -Will see back in 1 month with labs   FOLLOW UP: -Please schedule next 2 cycles of daratumumab with labs and MD visit q4 weeks -Continue Xgeva every 4 weeks   The total time spent in the appt was 25 minutes and more than 50% was on counseling and direct patient cares.  All of the patient's questions were answered with apparent satisfaction. The patient knows to call the clinic with any problems, questions or concerns.    Sullivan Lone MD [place] AAHIVMS [facility] Hematology/Oncology Physician [facility]  (Office):       [phone] (Work cell):  [phone] (Fax):  [phone]  05/03/2019 10:14 AM  I, Yevette Edwards, am acting as a Education administrator for Dr. Sullivan Lone.   .I have reviewed the above documentation for accuracy and completeness, and I agree with the above. Brunetta Genera MD

## 2019-05-03 NOTE — Patient Instructions (Signed)
[facility] Discharge Instructions for Patients Receiving Chemotherapy  Today you received the following chemotherapy agents: Darzalex  To help prevent nausea and vomiting after your treatment, we encourage you to take your nausea medication as directed.    If you develop nausea and vomiting that is not controlled by your nausea medication, call the clinic.   BELOW ARE SYMPTOMS THAT SHOULD BE REPORTED IMMEDIATELY:  *FEVER GREATER THAN 100.5 F  *CHILLS WITH OR WITHOUT FEVER  NAUSEA AND VOMITING THAT IS NOT CONTROLLED WITH YOUR NAUSEA MEDICATION  *UNUSUAL SHORTNESS OF BREATH  *UNUSUAL BRUISING OR BLEEDING  TENDERNESS IN MOUTH AND THROAT WITH OR WITHOUT PRESENCE OF ULCERS  *URINARY PROBLEMS  *BOWEL PROBLEMS  UNUSUAL RASH Items with * indicate a potential emergency and should be followed up as soon as possible.  Feel free to call the clinic should you have any questions or concerns. The clinic phone number is [phone].  Please show the CHEMO ALERT CARD at check-in to the Emergency Department and triage nurse.   

## 2019-05-03 NOTE — Progress Notes (Signed)
Okay to give xgeva today w/ CorCa 8.7 per Dr. Irene Limbo.  Demetrius Charity, PharmD, [facility] Pharmacist Pharmacy Phone: [phone] 05/03/2019

## 2019-05-04 ENCOUNTER — Telehealth: Payer: Self-pay | Admitting: Hematology

## 2019-05-04 NOTE — Telephone Encounter (Signed)
Scheduled appt per 12/21 los.  Spoke with pt daughter and she is aware of the scheduled appt date and time

## 2019-05-05 LAB — MULTIPLE MYELOMA PANEL, SERUM
Albumin SerPl Elph-Mcnc: 3.7 g/dL (ref 2.9–4.4)
Albumin/Glob SerPl: 1.4 (ref 0.7–1.7)
Alpha 1: 0.2 g/dL (ref 0.0–0.4)
Alpha2 Glob SerPl Elph-Mcnc: 0.9 g/dL (ref 0.4–1.0)
B-Globulin SerPl Elph-Mcnc: 1.1 g/dL (ref 0.7–1.3)
Gamma Glob SerPl Elph-Mcnc: 0.6 g/dL (ref 0.4–1.8)
Globulin, Total: 2.8 g/dL (ref 2.2–3.9)
IgA: 166 mg/dL (ref 61–437)
IgG (Immunoglobin G), Serum: 648 mg/dL (ref 603–1613)
IgM (Immunoglobulin M), Srm: 48 mg/dL (ref 15–143)
Total Protein ELP: 6.5 g/dL (ref 6.0–8.5)

## 2019-05-27 DIAGNOSIS — Z20828 Contact with and (suspected) exposure to other viral communicable diseases: Secondary | ICD-10-CM | POA: Diagnosis not present

## 2019-05-28 DIAGNOSIS — Z20828 Contact with and (suspected) exposure to other viral communicable diseases: Secondary | ICD-10-CM | POA: Diagnosis not present

## 2019-05-31 ENCOUNTER — Inpatient Hospital Stay: Payer: Medicare Other

## 2019-05-31 ENCOUNTER — Inpatient Hospital Stay: Payer: Medicare Other | Attending: Hematology | Admitting: Hematology

## 2019-05-31 ENCOUNTER — Other Ambulatory Visit: Payer: Self-pay

## 2019-05-31 VITALS — BP 173/91 | HR 70 | Temp 98.2°F | Resp 16 | Ht 71.0 in | Wt 214.8 lb

## 2019-05-31 DIAGNOSIS — Z7982 Long term (current) use of aspirin: Secondary | ICD-10-CM | POA: Insufficient documentation

## 2019-05-31 DIAGNOSIS — I10 Essential (primary) hypertension: Secondary | ICD-10-CM | POA: Insufficient documentation

## 2019-05-31 DIAGNOSIS — Z79899 Other long term (current) drug therapy: Secondary | ICD-10-CM | POA: Diagnosis not present

## 2019-05-31 DIAGNOSIS — R0789 Other chest pain: Secondary | ICD-10-CM | POA: Diagnosis not present

## 2019-05-31 DIAGNOSIS — Z9225 Personal history of immunosupression therapy: Secondary | ICD-10-CM | POA: Insufficient documentation

## 2019-05-31 DIAGNOSIS — Z87891 Personal history of nicotine dependence: Secondary | ICD-10-CM | POA: Insufficient documentation

## 2019-05-31 DIAGNOSIS — Z87311 Personal history of (healed) other pathological fracture: Secondary | ICD-10-CM | POA: Insufficient documentation

## 2019-05-31 DIAGNOSIS — C7951 Secondary malignant neoplasm of bone: Secondary | ICD-10-CM

## 2019-05-31 DIAGNOSIS — Z950 Presence of cardiac pacemaker: Secondary | ICD-10-CM | POA: Diagnosis not present

## 2019-05-31 DIAGNOSIS — C9001 Multiple myeloma in remission: Secondary | ICD-10-CM

## 2019-05-31 DIAGNOSIS — M545 Low back pain: Secondary | ICD-10-CM | POA: Insufficient documentation

## 2019-05-31 DIAGNOSIS — C9000 Multiple myeloma not having achieved remission: Secondary | ICD-10-CM | POA: Insufficient documentation

## 2019-05-31 LAB — CBC WITH DIFFERENTIAL/PLATELET
Abs Immature Granulocytes: 0.01 10*3/uL (ref 0.00–0.07)
Basophils Absolute: 0.1 10*3/uL (ref 0.0–0.1)
Basophils Relative: 1 %
Eosinophils Absolute: 0.4 10*3/uL (ref 0.0–0.5)
Eosinophils Relative: 7 %
HCT: 41.1 % (ref 39.0–52.0)
Hemoglobin: 13.1 g/dL (ref 13.0–17.0)
Immature Granulocytes: 0 %
Lymphocytes Relative: 45 %
Lymphs Abs: 3 10*3/uL (ref 0.7–4.0)
MCH: 27.5 pg (ref 26.0–34.0)
MCHC: 31.9 g/dL (ref 30.0–36.0)
MCV: 86.2 fL (ref 80.0–100.0)
Monocytes Absolute: 0.5 10*3/uL (ref 0.1–1.0)
Monocytes Relative: 8 %
Neutro Abs: 2.5 10*3/uL (ref 1.7–7.7)
Neutrophils Relative %: 39 %
Platelets: 161 10*3/uL (ref 150–400)
RBC: 4.77 MIL/uL (ref 4.22–5.81)
RDW: 14.8 % (ref 11.5–15.5)
WBC: 6.5 10*3/uL (ref 4.0–10.5)
nRBC: 0 % (ref 0.0–0.2)

## 2019-05-31 LAB — CMP (CANCER CENTER ONLY)
ALT: 12 U/L (ref 0–44)
AST: 15 U/L (ref 15–41)
Albumin: 3.9 g/dL (ref 3.5–5.0)
Alkaline Phosphatase: 71 U/L (ref 38–126)
Anion gap: 9 (ref 5–15)
BUN: 16 mg/dL (ref 8–23)
CO2: 25 mmol/L (ref 22–32)
Calcium: 8.4 mg/dL — ABNORMAL LOW (ref 8.9–10.3)
Chloride: 107 mmol/L (ref 98–111)
Creatinine: 1.12 mg/dL (ref 0.61–1.24)
GFR, Est AFR Am: >60 mL/min (ref >60)
GFR, Estimated: >60 mL/min (ref >60)
Glucose, Bld: 95 mg/dL (ref 70–99)
Potassium: 4.1 mmol/L (ref 3.5–5.1)
Sodium: 141 mmol/L (ref 135–145)
Total Bilirubin: 0.5 mg/dL (ref 0.3–1.2)
Total Protein: 6.7 g/dL (ref 6.5–8.1)

## 2019-05-31 NOTE — Progress Notes (Signed)
HEMATOLOGY/ONCOLOGY CLINIC NOTE  Date of Service: 05/31/2019  Patient Care Team: [name], MD as PCP - General (Family Medicine) [name], MD (Cardiology) [name], MD (Cardiology) [name], DO as Consulting Physician (Neurology)  CHIEF COMPLAINTS/PURPOSE OF CONSULTATION:  Continue mx of myeloma  HISTORY OF PRESENTING ILLNESS:   Jonathon [name] is a wonderful 84 y.o. male who has been referred to [place] by Dr. [name] for evaluation and management of Lytic bone lesions. He is accompanied today by his daughter. The pt reports that he is doing well overall. He is currently a resident at [facility] in [place], [place].   The pt presented to the ED on 03/24/18 with a right distal clavicle fracture, which occurred after trying to get out of bed and pushing himself up with his right arm. He notes that his energy has been noticeably decreased for the last month. He also notes that his lower back has a slight sting when trying to stand up from sitting. He also notes some pain in his lower, right chest wall, which is exacerbated when coughing. The pt has been taking 63m Tramadol which is controlling his pain. He does endorse some weakening of both of his lower extremities in the past 1-2 months, which he attributes somewhat to his recent lower back pain.   The pt denies any bowel abnormalities recently. He notes that he stays very well hydrated, and urinates 4-5 times a night, and does have BPH, and takes Flomax. The pt denies any recent changes in his urination habits.   The pt notes that his appetite decreased for a few months, until he began assisted living. He has lost 9 pounds in the last week.   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 He now uses a walker to ambulate. He notes that the CIDP symptoms are primarily located to his legs.  He sees Dr. [name] Neurology. He took steroids without any symptomatic relief. IVIG was cost-prohibitive for the patient, but was recommended.    Regarding the patient's anemia in April 2019, he denies any obvious bleeding at that time, and denies surgeries.    The pt notes that his last colonoscopy, in 2015, was not concerning, which is corroborated by chart review.   The pt and daughter deny any concerns for memory issues, and the daughter adds his memory is "elephant-like". They are not sure why Namenda is on his medication list.   The pt denies any recent dental procedures, or concerns for dental issues. He has maintained every 6 month dental cleanings for all of his life.   Of note prior to the patient's visit today, pt has had a CT C/A/P completed on 03/31/18 with results revealing IMPRESSION: 1. Lytic lesion throughout all visualized vertebra from the lower cervical spine through the sacrum. Epidural extension of tumor T2, T3 and L2 level. Right L3-4 foraminal extension tumor. 2. Lytic lesions involving majority of ribs. Multiple pathologic rib fractures with bony expansion/sub pleural extension of tumor at several levels. 3. Lytic lesions of the scapula, hips bilaterally and pelvis bilaterally. With further progression of tumor, patient may be at risk for pathologic fractures. Pathologic fracture right clavicle incompletely assessed. 4. Circumferential narrowing sigmoid colon. Question possibility of underlying mass (series 2, image 97). Alternatively this could represent muscular hypertrophy/peristalsis. 5. Gastric  antrum circumferential narrowing. Cannot exclude mass although this may be related to peristalsis. 6. No primary lung malignancy noted. 7. Matted aortic pulmonary window lymph nodes. 8. Circumferential urinary bladder wall thickening greater anterior dome region. Right lateral bladder diverticulum. 9. 5 mm low-density lesion pancreatic body (series 5, image 10) unchanged.  This is felt unlikely to be a primary malignancy. 10. Right parapelvic 2.2 cm cyst without significant change. Scattered low-density renal lesions bila2terally too small to adequately characterize. Some were present previously. Statistically these are likely cysts. 11. Stable adrenal gland hyperplasia. 12. Gynecomastia. 13. Prostate gland causes slight impression upon the bladder base. 14.  Aortic Atherosclerosis. 15. Sequential pacemaker in place. 16. Gallstones.  Most recent lab results (03/25/18) of CBC w/diff and CMP is as follows: all values are WNL except for RBC at 4.14, HGB at 11.3, HCT at 33.6, Chloride at 94, CO2 at 33, Glucose at 100, BUN at 25, Total Protein at 9.4, Albumin at 3.4, Calcium at 13.8, GFR at 58. 03/25/18 PSA, Total was normal at 1.2  On review of systems, pt reports positional lower back pain, lower right chest wall pain, recently decreased energy levels, relatively weaker appetite, weight loss, right clavicle pain, weakening of the lower extremities, moving his bowels well, and denies changes in urination, abdominal pains, changes in breathing, changes in bowel habits, pain along the spine, leg swelling, and any other symptoms.  Interval History:   Jonathon [name] returns today for management, evaluation, and C15D1 Daratumumab treatment of his Multiple Myeloma. The patient's last visit with [place] was on 05/03/2019. The pt reports that he is doing well overall.  The pt reports that issinuses have been aggravating him but he has not been taking any medication for it. Pt is an assisted living facility and is scheduled to receive his first dose of the COVID19 vaccine on Thursday.   He is concerned about his consistently high BP. His systolic number has been around the 557'D and his diastolic number has been around the 90's in the mornings. Pt has not spoken with his PCP about his increased BP.  Lab results today (05/31/19) of CBC w/diff and CMP is as follows: all values are WNL except  for Calcium at 8.4. 05/31/2019 MMP - no M spike.  On review of systems, pt reports rhinitis and denies fevers, chills, night sweats, SOB, abdominal pain and any other symptoms.    MEDICAL HISTORY:  Past Medical History:  Diagnosis Date  . Asthma   . BENIGN PROSTATIC HYPERTROPHY, HX OF 10/25/2008  . Bone metastases ([place]) 04/14/2018  . BRADYCARDIA 2005  . Chronic diastolic congestive heart failure ([place]) 06/05/2016  . CIDP (chronic inflammatory demyelinating polyneuropathy) ([place])   . HYPERTENSION 11/21/2006  . Iron deficiency anemia, unspecified 04/19/2013  . Multiple myeloma ([place]) 04/29/2018  . 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 PACEMAKER, PERMANENT 2005   Gen change 2014 Medtronic Adaptic L dual-chamber pacemaker, serial [id number]   . SVT (supraventricular tachycardia) ([place])   . TOBACCO ABUSE 10/25/2008   Quit 2012    SURGICAL HISTORY: Past Surgical History:  Procedure Laterality Date  . COLONOSCOPY  [id number]  . PACEMAKER INSERTION  2005  . PERMANENT PACEMAKER GENERATOR CHANGE N/A 06/19/2012   Procedure: PERMANENT PACEMAKER GENERATOR CHANGE;  Surgeon: [name], MD; Medtronic Adaptic L dual-chamber pacemaker, serial #[id number]    . SKIN GRAFT Right 1962   wrist    SOCIAL HISTORY: Social History   Socioeconomic History  . Marital status: Divorced  Spouse name: Not on file  . Number of children: 4  . Years of education: Not on file  . Highest education level: Not on file  Occupational History  . Occupation: ENVIROMENTAL Location manager: [employer]  . Occupation: retired  Tobacco Use  . Smoking status: Former Smoker    Packs/day: 0.25    Years: 46.00    Pack years: 11.50    Types: Cigarettes    Start date: 03/16/1965    Quit date: 06/15/2010    Years since quitting: 8.9  . Smokeless tobacco: Never Used  . Tobacco comment: quit 3 years ago  Substance and Sexual Activity  . Alcohol use: No    Alcohol/week: 0.0 standard drinks    . Drug use: No  . Sexual activity: Not on file  Other Topics Concern  . Not on file  Social History Narrative   Has relocated from [place] in 2007. 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  Patient in a facility thru [facility]   Social Determinants of Health   Financial Resource Strain:   . Difficulty of Paying Living Expenses: Not on file  Food Insecurity:   . Worried About Charity fundraiser in the Last Year: Not on file  . Ran Out of Food in the Last Year: Not on file  Transportation Needs:   . Lack of Transportation (Medical): Not on file  . Lack of Transportation (Non-Medical): Not on file  Physical Activity:   . Days of Exercise per Week: Not on file  . Minutes of Exercise per Session: Not on file  Stress:   . Feeling of Stress : Not on file  Social Connections:   . Frequency of Communication with Friends and Family: Not on file  . Frequency of Social Gatherings with Friends and Family: Not on file  . Attends Religious Services: Not on file  . Active Member of Clubs or Organizations: Not on file  . Attends Archivist Meetings: Not on file  . Marital Status: Not on file  Intimate Partner Violence:   . Fear of Current or Ex-Partner: Not on file  . Emotionally Abused: Not on file  . Physically Abused: Not on file  . Sexually Abused: Not on file    FAMILY HISTORY: Family History  Problem Relation Age of Onset  . Heart attack Father        Died, 90  . Kidney disease Father   . Parkinson's disease Mother        Died, 62  . Breast cancer Sister        Living, 31  . Diabetes type II Brother        Living, 49  . Breast cancer Sister        Living, 23  . Diabetes Daughter        Living, 30  . Diabetes Son        Living, 49  . Ovarian cancer Daughter   . Colon cancer Neg Hx   . Rectal cancer Neg Hx   . Stomach cancer Neg Hx     ALLERGIES:  is allergic to lexapro [escitalopram oxalate].  MEDICATIONS:  Current Outpatient Medications  Medication Sig Dispense Refill  .  acyclovir (ZOVIRAX) 400 MG tablet Take 1 tablet (400 mg total) by mouth 2 (two) times daily. 60 tablet 11  . aspirin EC 81 MG tablet Take 81 mg by mouth daily.    Marland Kitchen dexamethasone (DECADRON) 4 MG tablet 59m (5 tabs) with breakfast  the day after each Daratumumab infusion 20 tablet 4  . flecainide (TAMBOCOR) 50 MG tablet Take 75 mg by mouth daily as needed.    . furosemide (LASIX) 40 MG tablet Take 40 mg by mouth daily.    . irbesartan (AVAPRO) 300 MG tablet Take 300 mg by mouth daily.    . Melatonin 3 MG CAPS Take 3 mg by mouth at bedtime.    . memantine (NAMENDA) 5 MG tablet Take 5 mg by mouth 2 (two) times daily.    . metoprolol tartrate (LOPRESSOR) 25 MG tablet Take 1 tablet (25 mg total) by mouth 2 (two) times daily.    . ondansetron (ZOFRAN) 8 MG tablet Take 1 tablet (8 mg total) by mouth 2 (two) times daily as needed (Nausea or vomiting). 30 tablet 1  . oseltamivir (TAMIFLU) 75 MG capsule Take 1 capsule (75 mg total) by mouth 2 (two) times daily. 9 capsule 0  . polyethylene glycol (MIRALAX / GLYCOLAX) packet Take 17 g by mouth daily. 14 each 0  . prochlorperazine (COMPAZINE) 10 MG tablet Take 1 tablet (10 mg total) by mouth every 6 (six) hours as needed (Nausea or vomiting). 30 tablet 1  . Saccharomyces boulardii (FLORASTOR PO) Take 250 mg by mouth 2 (two) times daily.    . tamsulosin (FLOMAX) 0.4 MG CAPS capsule Take 2 capsules (0.8 mg total) by mouth daily. 90 capsule 0  . traMADol (ULTRAM) 50 MG tablet Take 1 tablet( 50 mg total) by mouth every 8 hrs as needed for pain 90 tablet 2  . vitamin B-12 (CYANOCOBALAMIN) 500 MCG tablet Take 1,000 mcg by mouth daily.    . Vitamin D, Ergocalciferol, (DRISDOL) 50000 units CAPS capsule Take 50,000 Units by mouth every 7 (seven) days.     No current facility-administered medications for this visit.   Facility-Administered Medications Ordered in Other Visits  Medication Dose Route Frequency Provider Last Rate Last Admin  . sodium chloride flush (NS)  0.9 % injection 10 mL  10 mL Intracatheter PRN Brunetta Genera, MD        REVIEW OF SYSTEMS:   A 10+ POINT REVIEW OF SYSTEMS WAS OBTAINED including neurology, dermatology, psychiatry, cardiac, respiratory, lymph, extremities, GI, GU, Musculoskeletal, constitutional, breasts, reproductive, HEENT.  All pertinent positives are noted in the HPI.  All others are negative.   PHYSICAL EXAMINATION: ECOG PERFORMANCE STATUS: 2 - Symptomatic, <50% confined to bed  .BP (!) 173/91 (BP Location: Left Arm, Patient Position: Sitting) Comment: RN aware  Pulse 70   Temp 98.2 F (36.8 C) (Temporal)   Resp 16   Ht 5' 11" (1.803 m)   Wt 214 lb 12.8 oz (97.4 kg)   SpO2 100%   BMI 29.96 kg/m   Exam was given in a chair  GENERAL:alert, in no acute distress and comfortable SKIN: no acute rashes, no significant lesions EYES: conjunctiva are pink and non-injected, sclera anicteric OROPHARYNX: MMM, no exudates, no oropharyngeal erythema or ulceration NECK: supple, no JVD LYMPH:  no palpable lymphadenopathy in the cervical, axillary or inguinal regions LUNGS: clear to auscultation b/l with normal respiratory effort HEART: regular rate & rhythm ABDOMEN:  normoactive bowel sounds , non tender, not distended. No palpable hepatosplenomegaly.  Extremity: no pedal edema PSYCH: alert & oriented x 3 with fluent speech NEURO: no focal motor/sensory deficits  LABORATORY DATA:  I have reviewed the data as listed  . CBC Latest Ref Rng & Units 05/31/2019 05/03/2019 04/05/2019  WBC 4.0 - 10.5 K/uL 6.5 6.4  5.3  Hemoglobin 13.0 - 17.0 g/dL 13.1 12.7(L) 12.6(L)  Hematocrit 39.0 - 52.0 % 41.1 39.8 39.4  Platelets 150 - 400 K/uL 161 142(L) 138(L)    . CMP Latest Ref Rng & Units 05/31/2019 05/03/2019 04/05/2019  Glucose 70 - 99 mg/dL 95 100(H) 104(H)  BUN 8 - 23 mg/dL _0 Creatinine 0.61 - 1.24 mg/dL 1.12 1.10 1.14  Sodium 135 - 145 mmol/L 141 142 142  Potassium 3.5 - 5.1 mmol/L 4.1 3.7 4.1  Chloride 98  - 111 mmol/L 107 107 109  CO2 22 - 32 mmol/L _1 Calcium 8.9 - 10.3 mg/dL 8.4(L) 8.5(L) 8.7(L)  Total Protein 6.5 - 8.1 g/dL 6.7 6.7 6.7  Total Bilirubin 0.3 - 1.2 mg/dL 0.5 0.7 0.8  Alkaline Phos 38 - 126 U/L 71 59 69  AST 15 - 41 U/L _2 ALT 0 - 44 U/L _3 12/14/2018 MMP    12/14/2018 MMP    04/17/18 BM Bx:    RADIOGRAPHIC STUDIES: I have personally reviewed the radiological images as listed and agreed with the findings in the report. No results found.  ASSESSMENT & PLAN:  84 y.o. male with  1. Lytic bone lesions - w/u consistent with newly diagnosed multiple myeloma Labs upon initial presentation from 04/14/18; hypercalcemic with Calcium at 13.8, renal function up form baseline with Creatinine at 1.48, HGB at 11.3. PSA was normal.   03/31/18 CT C/A/P revealed 1. Lytic lesion throughout all visualized vertebra from the lower cervical spine through the sacrum. Epidural extension of tumor T2, T3 and L2 level. Right L3-4 foraminal extension tumor. 2. Lytic lesions involving majority of ribs. Multiple pathologic rib fractures with bony expansion/sub pleural extension of tumor at several levels. 3. Lytic lesions of the scapula, hips bilaterally and pelvis bilaterally. With further progression of tumor, patient may be at risk for pathologic fractures. Pathologic fracture right clavicle incompletely assessed. 4. Circumferential narrowing sigmoid colon. Question possibility of underlying mass (series 2, image 97). Alternatively this could represent muscular hypertrophy/peristalsis. 5. Gastric antrum circumferential narrowing. Cannot exclude mass although this may be related to peristalsis. 6. No primary lung malignancy noted. 7. Matted aortic pulmonary window lymph nodes. 8. Circumferential urinary bladder wall thickening greater anterior dome region. Right lateral bladder diverticulum. 9. 5 mm low-density lesion pancreatic body (series 5, image 10) unchanged. This is felt  unlikely to be a primary malignancy. 10. Right parapelvic 2.2 cm cyst without significant change. Scattered low-density renal lesions bilaterally too small to adequately characterize. Some were present previously. Statistically these are likely cysts. 11. Stable adrenal gland hyperplasia. 12. Gynecomastia. 13. Prostate gland causes slight impression upon the bladder base. 14.  Aortic Atherosclerosis. 15. Sequential pacemaker in place. 16. Gallstones.   04/23/18 PET/CT revealed Innumerable hypermetabolic lytic lesions throughout the skeleton especially involving the spine, ribs, bony pelvis along with some involvement of both proximal femurs. The appearance is compatible with pathologic diagnosis of plasma cell neoplasm could well reflect multiple myeloma. Resulting pathologic fractures of the right lateral clavicle and of multiple bilateral ribs. Lesions are present along the cortical margins of the spinal canal. 2. Both the area and the stomach in the area in the sigmoid colon drawn attention to on prior CT scan appear benign normal today. 3. The confluence of AP window lymph nodes measures about 1.2 cm in short axis with maximum SUV 3.1, which is mildly above the blood pool merit surveillance. 4. Other imaging findings  of potential clinical significance: Aortic Atherosclerosis. Coronary atherosclerosis. Pacemaker noted. Borderline prostatomegaly. Cholelithiasis.  04/21/18 BM Bx revealed Normocellular bone marrow with Plasma Cell neoplasm. Scattered medium sized clusters of kappa-restricted plasma cells (14% aspirate, 10% CD138 immunohistochemistry.   I do suspect that the burden of disease is underestimated in the bone marrow sample given PET/CT findings of innumerable lytic lesions and an M spike of 3.4g   06/08/18 DG Hips bilat which revealed Innumerable lucencies are noted throughout the pelvis and proximal femurs, similar findings noted on prior PET-CT of 04/23/2018. Findings are consistent patient's  known multiple myeloma. Femoral necks are intact. 2.  Aortoiliac atherosclerotic vascular disease.  2. Pathologic right clavicle fracture due to myeloma- resolved 3. Hypercalcemia- resolved.  PLAN:    -Discussed pt labwork today, 05/31/19; blood counts and chemistries look good  -Discussed 05/31/2019 MMP is in progress  -Discussed 05/03/2019 MMP shows no M Spike -Pt continues to be in remission from Multiple Myeloma -Recommended pt f/u with scheduled appointment for COVID19 vaccine -Due to respiratory symptoms and upcoming vaccination will hold Daratumumab today -CIDP has been considered in the treatment choice-- remains stable/improved with Daratumumab -Will hold Xgeva today -Recommended pt f/u with PCP for HTN management  -Will see back in 4 weeks with labs  FOLLOW UP: Holding Daratumumab and Xgeva today Plz schedule next dose of Daratumumab , Xgeva, labs and MD visit in 4 weeks  The total time spent in the appt was 15 minutes and more than 50% was on counseling and direct patient cares.  All of the patient's questions were answered with apparent satisfaction. The patient knows to call the clinic with any problems, questions or concerns.    Sullivan Lone MD [place] AAHIVMS [facility] Hematology/Oncology Physician [facility]  (Office):       [phone] (Work cell):  [phone] (Fax):           [phone]  05/31/2019 11:23 AM  I, Yevette Edwards, am acting as a scribe for Dr. Sullivan Lone.   .I have reviewed the above documentation for accuracy and completeness, and I agree with the above. Brunetta Genera MD

## 2019-06-01 ENCOUNTER — Telehealth: Payer: Self-pay | Admitting: Hematology

## 2019-06-01 NOTE — Telephone Encounter (Signed)
Per 1/18 los, appts already scheduled,

## 2019-06-02 DIAGNOSIS — Z23 Encounter for immunization: Secondary | ICD-10-CM | POA: Diagnosis not present

## 2019-06-03 LAB — MULTIPLE MYELOMA PANEL, SERUM
Albumin SerPl Elph-Mcnc: 3.8 g/dL (ref 2.9–4.4)
Albumin/Glob SerPl: 1.5 (ref 0.7–1.7)
Alpha 1: 0.2 g/dL (ref 0.0–0.4)
Alpha2 Glob SerPl Elph-Mcnc: 0.8 g/dL (ref 0.4–1.0)
B-Globulin SerPl Elph-Mcnc: 1.1 g/dL (ref 0.7–1.3)
Gamma Glob SerPl Elph-Mcnc: 0.6 g/dL (ref 0.4–1.8)
Globulin, Total: 2.6 g/dL (ref 2.2–3.9)
IgA: 163 mg/dL (ref 61–437)
IgG (Immunoglobin G), Serum: 667 mg/dL (ref 603–1613)
IgM (Immunoglobulin M), Srm: 48 mg/dL (ref 15–143)
Total Protein ELP: 6.4 g/dL (ref 6.0–8.5)

## 2019-06-08 DIAGNOSIS — M79674 Pain in right toe(s): Secondary | ICD-10-CM | POA: Diagnosis not present

## 2019-06-08 DIAGNOSIS — B351 Tinea unguium: Secondary | ICD-10-CM | POA: Diagnosis not present

## 2019-06-08 DIAGNOSIS — M79675 Pain in left toe(s): Secondary | ICD-10-CM | POA: Diagnosis not present

## 2019-06-28 ENCOUNTER — Inpatient Hospital Stay: Payer: Medicare Other | Attending: Hematology

## 2019-06-28 ENCOUNTER — Other Ambulatory Visit: Payer: Self-pay

## 2019-06-28 ENCOUNTER — Inpatient Hospital Stay (HOSPITAL_BASED_OUTPATIENT_CLINIC_OR_DEPARTMENT_OTHER): Payer: Medicare Other | Admitting: Hematology

## 2019-06-28 ENCOUNTER — Inpatient Hospital Stay: Payer: Medicare Other

## 2019-06-28 VITALS — BP 167/92 | HR 60 | Temp 98.1°F | Resp 16

## 2019-06-28 VITALS — BP 193/97 | HR 71 | Temp 98.1°F | Resp 17 | Ht 71.0 in | Wt 214.9 lb

## 2019-06-28 DIAGNOSIS — Z79899 Other long term (current) drug therapy: Secondary | ICD-10-CM | POA: Diagnosis not present

## 2019-06-28 DIAGNOSIS — Z87891 Personal history of nicotine dependence: Secondary | ICD-10-CM | POA: Diagnosis not present

## 2019-06-28 DIAGNOSIS — Z950 Presence of cardiac pacemaker: Secondary | ICD-10-CM | POA: Insufficient documentation

## 2019-06-28 DIAGNOSIS — I110 Hypertensive heart disease with heart failure: Secondary | ICD-10-CM | POA: Diagnosis not present

## 2019-06-28 DIAGNOSIS — D649 Anemia, unspecified: Secondary | ICD-10-CM | POA: Insufficient documentation

## 2019-06-28 DIAGNOSIS — Z8041 Family history of malignant neoplasm of ovary: Secondary | ICD-10-CM | POA: Insufficient documentation

## 2019-06-28 DIAGNOSIS — C7951 Secondary malignant neoplasm of bone: Secondary | ICD-10-CM | POA: Diagnosis not present

## 2019-06-28 DIAGNOSIS — Z7982 Long term (current) use of aspirin: Secondary | ICD-10-CM | POA: Insufficient documentation

## 2019-06-28 DIAGNOSIS — Z5112 Encounter for antineoplastic immunotherapy: Secondary | ICD-10-CM

## 2019-06-28 DIAGNOSIS — Z7189 Other specified counseling: Secondary | ICD-10-CM

## 2019-06-28 DIAGNOSIS — Z803 Family history of malignant neoplasm of breast: Secondary | ICD-10-CM | POA: Insufficient documentation

## 2019-06-28 DIAGNOSIS — C9000 Multiple myeloma not having achieved remission: Secondary | ICD-10-CM | POA: Diagnosis not present

## 2019-06-28 DIAGNOSIS — C9001 Multiple myeloma in remission: Secondary | ICD-10-CM | POA: Diagnosis not present

## 2019-06-28 DIAGNOSIS — I5032 Chronic diastolic (congestive) heart failure: Secondary | ICD-10-CM | POA: Insufficient documentation

## 2019-06-28 LAB — CBC WITH DIFFERENTIAL/PLATELET
Abs Immature Granulocytes: 0.01 10*3/uL (ref 0.00–0.07)
Basophils Absolute: 0.1 10*3/uL (ref 0.0–0.1)
Basophils Relative: 1 %
Eosinophils Absolute: 0.4 10*3/uL (ref 0.0–0.5)
Eosinophils Relative: 6 %
HCT: 42.1 % (ref 39.0–52.0)
Hemoglobin: 13.6 g/dL (ref 13.0–17.0)
Immature Granulocytes: 0 %
Lymphocytes Relative: 46 %
Lymphs Abs: 3.1 10*3/uL (ref 0.7–4.0)
MCH: 27.5 pg (ref 26.0–34.0)
MCHC: 32.3 g/dL (ref 30.0–36.0)
MCV: 85.2 fL (ref 80.0–100.0)
Monocytes Absolute: 0.7 10*3/uL (ref 0.1–1.0)
Monocytes Relative: 10 %
Neutro Abs: 2.4 10*3/uL (ref 1.7–7.7)
Neutrophils Relative %: 37 %
Platelets: 168 10*3/uL (ref 150–400)
RBC: 4.94 MIL/uL (ref 4.22–5.81)
RDW: 14.6 % (ref 11.5–15.5)
WBC: 6.6 10*3/uL (ref 4.0–10.5)
nRBC: 0 % (ref 0.0–0.2)

## 2019-06-28 LAB — CMP (CANCER CENTER ONLY)
ALT: 12 U/L (ref 0–44)
AST: 14 U/L — ABNORMAL LOW (ref 15–41)
Albumin: 3.9 g/dL (ref 3.5–5.0)
Alkaline Phosphatase: 66 U/L (ref 38–126)
Anion gap: 11 (ref 5–15)
BUN: 16 mg/dL (ref 8–23)
CO2: 24 mmol/L (ref 22–32)
Calcium: 9.3 mg/dL (ref 8.9–10.3)
Chloride: 107 mmol/L (ref 98–111)
Creatinine: 1.11 mg/dL (ref 0.61–1.24)
GFR, Est AFR Am: >60 mL/min (ref >60)
GFR, Estimated: >60 mL/min (ref >60)
Glucose, Bld: 102 mg/dL — ABNORMAL HIGH (ref 70–99)
Potassium: 4.2 mmol/L (ref 3.5–5.1)
Sodium: 142 mmol/L (ref 135–145)
Total Bilirubin: 0.6 mg/dL (ref 0.3–1.2)
Total Protein: 7.1 g/dL (ref 6.5–8.1)

## 2019-06-28 MED ORDER — SODIUM CHLORIDE 0.9 % IV SOLN
16.0000 mg/kg | Freq: Once | INTRAVENOUS | Status: AC
  Administered 2019-06-28: 1600 mg via INTRAVENOUS
  Filled 2019-06-28: qty 80

## 2019-06-28 MED ORDER — FAMOTIDINE IN NACL 20-0.9 MG/50ML-% IV SOLN
INTRAVENOUS | Status: AC
  Filled 2019-06-28: qty 50

## 2019-06-28 MED ORDER — MONTELUKAST SODIUM 10 MG PO TABS
ORAL_TABLET | ORAL | Status: AC
  Filled 2019-06-28: qty 1

## 2019-06-28 MED ORDER — SODIUM CHLORIDE 0.9 % IV SOLN
20.0000 mg | Freq: Once | INTRAVENOUS | Status: AC
  Administered 2019-06-28: 20 mg via INTRAVENOUS
  Filled 2019-06-28: qty 20

## 2019-06-28 MED ORDER — DENOSUMAB 120 MG/1.7ML ~~LOC~~ SOLN
120.0000 mg | Freq: Once | SUBCUTANEOUS | Status: AC
  Administered 2019-06-28: 14:00:00 120 mg via SUBCUTANEOUS

## 2019-06-28 MED ORDER — ACETAMINOPHEN 325 MG PO TABS
650.0000 mg | ORAL_TABLET | Freq: Once | ORAL | Status: AC
  Administered 2019-06-28: 11:00:00 650 mg via ORAL

## 2019-06-28 MED ORDER — SODIUM CHLORIDE 0.9 % IV SOLN
Freq: Once | INTRAVENOUS | Status: AC
  Filled 2019-06-28: qty 250

## 2019-06-28 MED ORDER — ACETAMINOPHEN 325 MG PO TABS
ORAL_TABLET | ORAL | Status: AC
  Filled 2019-06-28: qty 2

## 2019-06-28 MED ORDER — MONTELUKAST SODIUM 10 MG PO TABS
10.0000 mg | ORAL_TABLET | Freq: Once | ORAL | Status: AC
  Administered 2019-06-28: 10 mg via ORAL

## 2019-06-28 MED ORDER — DIPHENHYDRAMINE HCL 25 MG PO CAPS
50.0000 mg | ORAL_CAPSULE | Freq: Once | ORAL | Status: AC
  Administered 2019-06-28: 50 mg via ORAL

## 2019-06-28 MED ORDER — FAMOTIDINE IN NACL 20-0.9 MG/50ML-% IV SOLN
20.0000 mg | Freq: Once | INTRAVENOUS | Status: AC
  Administered 2019-06-28: 11:00:00 20 mg via INTRAVENOUS

## 2019-06-28 MED ORDER — DENOSUMAB 120 MG/1.7ML ~~LOC~~ SOLN
SUBCUTANEOUS | Status: AC
  Filled 2019-06-28: qty 1.7

## 2019-06-28 MED ORDER — DIPHENHYDRAMINE HCL 25 MG PO CAPS
ORAL_CAPSULE | ORAL | Status: AC
  Filled 2019-06-28: qty 2

## 2019-06-28 NOTE — Progress Notes (Signed)
HEMATOLOGY/ONCOLOGY CLINIC NOTE  Date of Service: 06/28/2019  Patient Care Team: [name], MD as PCP - General (Family Medicine) [name], MD (Cardiology) [name], MD (Cardiology) [name], DO as Consulting Physician (Neurology)  CHIEF COMPLAINTS/PURPOSE OF CONSULTATION:  Continue mx of myeloma  HISTORY OF PRESENTING ILLNESS:   Jonathon [name] is a wonderful 84 y.o. male who has been referred to [place] by Dr. [name] for evaluation and management of Lytic bone lesions. He is accompanied today by his daughter. The pt reports that he is doing well overall. He is currently a resident at [facility] in [place], [place].   The pt presented to the ED on 03/24/18 with a right distal clavicle fracture, which occurred after trying to get out of bed and pushing himself up with his right arm. He notes that his energy has been noticeably decreased for the last month. He also notes that his lower back has a slight sting when trying to stand up from sitting. He also notes some pain in his lower, right chest wall, which is exacerbated when coughing. The pt has been taking 51m Tramadol which is controlling his pain. He does endorse some weakening of both of his lower extremities in the past 1-2 months, which he attributes somewhat to his recent lower back pain.   The pt denies any bowel abnormalities recently. He notes that he stays very well hydrated, and urinates 4-5 times a night, and does have BPH, and takes Flomax. The pt denies any recent changes in his urination habits.   The pt notes that his appetite decreased for a few months, until he began assisted living. He has lost 9 pounds in the last week.   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 He now uses a walker to ambulate. He notes that the CIDP symptoms are primarily located to his legs.  He sees Dr. [name] Neurology. He took steroids without any symptomatic relief. IVIG was cost-prohibitive for the patient, but was recommended.    Regarding the patient's anemia in April 2019, he denies any obvious bleeding at that time, and denies surgeries.    The pt notes that his last colonoscopy, in 2015, was not concerning, which is corroborated by chart review.   The pt and daughter deny any concerns for memory issues, and the daughter adds his memory is "elephant-like". They are not sure why Namenda is on his medication list.   The pt denies any recent dental procedures, or concerns for dental issues. He has maintained every 6 month dental cleanings for all of his life.   Of note prior to the patient's visit today, pt has had a CT C/A/P completed on 03/31/18 with results revealing IMPRESSION: 1. Lytic lesion throughout all visualized vertebra from the lower cervical spine through the sacrum. Epidural extension of tumor T2, T3 and L2 level. Right L3-4 foraminal extension tumor. 2. Lytic lesions involving majority of ribs. Multiple pathologic rib fractures with bony expansion/sub pleural extension of tumor at several levels. 3. Lytic lesions of the scapula, hips bilaterally and pelvis bilaterally. With further progression of tumor, patient may be at risk for pathologic fractures. Pathologic fracture right clavicle incompletely assessed. 4. Circumferential narrowing sigmoid colon. Question possibility of underlying mass (series 2, image 97). Alternatively this could represent muscular hypertrophy/peristalsis. 5. Gastric  antrum circumferential narrowing. Cannot exclude mass although this may be related to peristalsis. 6. No primary lung malignancy noted. 7. Matted aortic pulmonary window lymph nodes. 8. Circumferential urinary bladder wall thickening greater anterior dome region. Right lateral bladder diverticulum. 9. 5 mm low-density lesion pancreatic body (series 5, image 10) unchanged.  This is felt unlikely to be a primary malignancy. 10. Right parapelvic 2.2 cm cyst without significant change. Scattered low-density renal lesions bila2terally too small to adequately characterize. Some were present previously. Statistically these are likely cysts. 11. Stable adrenal gland hyperplasia. 12. Gynecomastia. 13. Prostate gland causes slight impression upon the bladder base. 14.  Aortic Atherosclerosis. 15. Sequential pacemaker in place. 16. Gallstones.  Most recent lab results (03/25/18) of CBC w/diff and CMP is as follows: all values are WNL except for RBC at 4.14, HGB at 11.3, HCT at 33.6, Chloride at 94, CO2 at 33, Glucose at 100, BUN at 25, Total Protein at 9.4, Albumin at 3.4, Calcium at 13.8, GFR at 58. 03/25/18 PSA, Total was normal at 1.2  On review of systems, pt reports positional lower back pain, lower right chest wall pain, recently decreased energy levels, relatively weaker appetite, weight loss, right clavicle pain, weakening of the lower extremities, moving his bowels well, and denies changes in urination, abdominal pains, changes in breathing, changes in bowel habits, pain along the spine, leg swelling, and any other symptoms.  Interval History:   [name] returns today for management, evaluation, and C15D1 Daratumumab treatment of his Multiple Myeloma. The patient's last visit with [place] was on 05/31/2019. The pt reports that he is doing well overall.  The pt reports that his last BP reading at home was close to 700 systolic. Pt gets regular BP readings first thing in the morning before breakfast or coffee. He has been taking his HTN medications as prescribed for years. He has cut back on his sodium intake, but notes that sometimes the food that he eats is pre-seasoned. Pt has no history of white-coat hypertension. It has been over a year since he has had a visit with his PCP.  Pt reports that his toes feel cold sometimes. The feeling is not persistent and his toes do not  change color.   He has his second dose of the COVID19 vaccine scheduled for later this week.   Lab results today (06/28/19) of CBC w/diff and CMP is as follows: all values are WNL except for Glucose at 102, AST at 14. 06/28/2019 MMP - no M spike  On review of systems, pt reports cold sensation in toes and denies chest pain, SOB, mouth sores, fevers, chills, night sweats and any other symptoms.   MEDICAL HISTORY:  Past Medical History:  Diagnosis Date  . Asthma   . BENIGN PROSTATIC HYPERTROPHY, HX OF 10/25/2008  . Bone metastases ([place]) 04/14/2018  . BRADYCARDIA 2005  . Chronic diastolic congestive heart failure ([place]) 06/05/2016  . CIDP (chronic inflammatory demyelinating polyneuropathy) ([place])   . HYPERTENSION 11/21/2006  . Iron deficiency anemia, unspecified 04/19/2013  . Multiple myeloma ([place]) 04/29/2018  . 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 PACEMAKER, PERMANENT 2005   Gen change 2014 Medtronic Adaptic L dual-chamber pacemaker, serial [id number]   . SVT (supraventricular tachycardia) ([place])   . TOBACCO ABUSE 10/25/2008   Quit 2012    SURGICAL HISTORY: Past Surgical History:  Procedure Laterality Date  . COLONOSCOPY  [id number]  . PACEMAKER INSERTION  2005  . PERMANENT PACEMAKER GENERATOR CHANGE N/A 06/19/2012  Procedure: PERMANENT PACEMAKER GENERATOR CHANGE;  Surgeon: [name], MD; Medtronic Adaptic L dual-chamber pacemaker, serial #[id number]    . SKIN GRAFT Right 1962   wrist    SOCIAL HISTORY: Social History   Socioeconomic History  . Marital status: Divorced    Spouse name: Not on file  . Number of children: 4  . Years of education: Not on file  . Highest education level: Not on file  Occupational History  . Occupation: ENVIROMENTAL Location manager: [employer]  . Occupation: retired  Tobacco Use  . Smoking status: Former Smoker    Packs/day: 0.25    Years: 46.00    Pack years: 11.50    Types: Cigarettes    Start date:  03/16/1965    Quit date: 06/15/2010    Years since quitting: 9.0  . Smokeless tobacco: Never Used  . Tobacco comment: quit 3 years ago  Substance and Sexual Activity  . Alcohol use: No    Alcohol/week: 0.0 standard drinks  . Drug use: No  . Sexual activity: Not on file  Other Topics Concern  . Not on file  Social History Narrative   Has relocated from [place] in 2007. 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  Patient in a facility thru [facility]   Social Determinants of Health   Financial Resource Strain:   . Difficulty of Paying Living Expenses: Not on file  Food Insecurity:   . Worried About Charity fundraiser in the Last Year: Not on file  . Ran Out of Food in the Last Year: Not on file  Transportation Needs:   . Lack of Transportation (Medical): Not on file  . Lack of Transportation (Non-Medical): Not on file  Physical Activity:   . Days of Exercise per Week: Not on file  . Minutes of Exercise per Session: Not on file  Stress:   . Feeling of Stress : Not on file  Social Connections:   . Frequency of Communication with Friends and Family: Not on file  . Frequency of Social Gatherings with Friends and Family: Not on file  . Attends Religious Services: Not on file  . Active Member of Clubs or Organizations: Not on file  . Attends Archivist Meetings: Not on file  . Marital Status: Not on file  Intimate Partner Violence:   . Fear of Current or Ex-Partner: Not on file  . Emotionally Abused: Not on file  . Physically Abused: Not on file  . Sexually Abused: Not on file    FAMILY HISTORY: Family History  Problem Relation Age of Onset  . Heart attack Father        Died, 32  . Kidney disease Father   . Parkinson's disease Mother        Died, 15  . Breast cancer Sister        Living, 33  . Diabetes type II Brother        Living, 31  . Breast cancer Sister        Living, 57  . Diabetes Daughter        Living, 65  . Diabetes Son        Living, 5  . Ovarian cancer Daughter   .  Colon cancer Neg Hx   . Rectal cancer Neg Hx   . Stomach cancer Neg Hx     ALLERGIES:  is allergic to lexapro [escitalopram oxalate].  MEDICATIONS:  Current Outpatient Medications  Medication Sig Dispense Refill  .  acyclovir (ZOVIRAX) 400 MG tablet Take 1 tablet (400 mg total) by mouth 2 (two) times daily. 60 tablet 11  . aspirin EC 81 MG tablet Take 81 mg by mouth daily.    Marland Kitchen dexamethasone (DECADRON) 4 MG tablet 88m (5 tabs) with breakfast the day after each Daratumumab infusion 20 tablet 4  . flecainide (TAMBOCOR) 50 MG tablet Take 75 mg by mouth daily as needed.    . furosemide (LASIX) 40 MG tablet Take 40 mg by mouth daily.    . irbesartan (AVAPRO) 300 MG tablet Take 300 mg by mouth daily.    . Melatonin 3 MG CAPS Take 3 mg by mouth at bedtime.    . memantine (NAMENDA) 5 MG tablet Take 5 mg by mouth 2 (two) times daily.    . metoprolol tartrate (LOPRESSOR) 25 MG tablet Take 1 tablet (25 mg total) by mouth 2 (two) times daily.    . ondansetron (ZOFRAN) 8 MG tablet Take 1 tablet (8 mg total) by mouth 2 (two) times daily as needed (Nausea or vomiting). 30 tablet 1  . polyethylene glycol (MIRALAX / GLYCOLAX) packet Take 17 g by mouth daily. 14 each 0  . prochlorperazine (COMPAZINE) 10 MG tablet Take 1 tablet (10 mg total) by mouth every 6 (six) hours as needed (Nausea or vomiting). 30 tablet 1  . Saccharomyces boulardii (FLORASTOR PO) Take 250 mg by mouth 2 (two) times daily.    . tamsulosin (FLOMAX) 0.4 MG CAPS capsule Take 2 capsules (0.8 mg total) by mouth daily. 90 capsule 0  . traMADol (ULTRAM) 50 MG tablet Take 1 tablet( 50 mg total) by mouth every 8 hrs as needed for pain 90 tablet 2  . vitamin B-12 (CYANOCOBALAMIN) 500 MCG tablet Take 1,000 mcg by mouth daily.    . Vitamin D, Ergocalciferol, (DRISDOL) 50000 units CAPS capsule Take 50,000 Units by mouth every 7 (seven) days.     No current facility-administered medications for this visit.   Facility-Administered Medications  Ordered in Other Visits  Medication Dose Route Frequency Provider Last Rate Last Admin  . sodium chloride flush (NS) 0.9 % injection 10 mL  10 mL Intracatheter PRN KBrunetta Genera MD        REVIEW OF SYSTEMS:   A 10+ POINT REVIEW OF SYSTEMS WAS OBTAINED including neurology, dermatology, psychiatry, cardiac, respiratory, lymph, extremities, GI, GU, Musculoskeletal, constitutional, breasts, reproductive, HEENT.  All pertinent positives are noted in the HPI.  All others are negative.   PHYSICAL EXAMINATION: ECOG PERFORMANCE STATUS: 2 - Symptomatic, <50% confined to bed  .BP (!) 193/97 (Patient Position: Sitting) Comment: Sandi RN notified  Pulse 71   Temp 98.1 F (36.7 C) (Temporal)   Resp 17   Ht '5\' 11"'  (1.803 m)   Wt 214 lb 14.4 oz (97.5 kg)   SpO2 100%   BMI 29.97 kg/m   Exam was given in a chair   GENERAL:alert, in no acute distress and comfortable SKIN: no acute rashes, no significant lesions EYES: conjunctiva are pink and non-injected, sclera anicteric OROPHARYNX: MMM, no exudates, no oropharyngeal erythema or ulceration NECK: supple, no JVD LYMPH:  no palpable lymphadenopathy in the cervical, axillary or inguinal regions LUNGS: clear to auscultation b/l with normal respiratory effort HEART: regular rate & rhythm ABDOMEN:  normoactive bowel sounds , non tender, not distended. No palpable hepatosplenomegaly.  Extremity: no pedal edema PSYCH: alert & oriented x 3 with fluent speech NEURO: no focal motor/sensory deficits  LABORATORY DATA:  I have reviewed  the data as listed  . CBC Latest Ref Rng & Units 06/28/2019 05/31/2019 05/03/2019  WBC 4.0 - 10.5 K/uL 6.6 6.5 6.4  Hemoglobin 13.0 - 17.0 g/dL 13.6 13.1 12.7(L)  Hematocrit 39.0 - 52.0 % 42.1 41.1 39.8  Platelets 150 - 400 K/uL 168 161 142(L)    . CMP Latest Ref Rng & Units 06/28/2019 05/31/2019 05/03/2019  Glucose 70 - 99 mg/dL 102(H) 95 100(H)  BUN 8 - 23 mg/dL '16 16 19  ' Creatinine 0.61 - 1.24 mg/dL 1.11 1.12  1.10  Sodium 135 - 145 mmol/L 142 141 142  Potassium 3.5 - 5.1 mmol/L 4.2 4.1 3.7  Chloride 98 - 111 mmol/L 107 107 107  CO2 22 - 32 mmol/L '24 25 25  ' Calcium 8.9 - 10.3 mg/dL 9.3 8.4(L) 8.5(L)  Total Protein 6.5 - 8.1 g/dL 7.1 6.7 6.7  Total Bilirubin 0.3 - 1.2 mg/dL 0.6 0.5 0.7  Alkaline Phos 38 - 126 U/L 66 71 59  AST 15 - 41 U/L 14(L) 15 16  ALT 0 - 44 U/L '12 12 10   ' 12/14/2018 MMP    12/14/2018 MMP    04/17/18 BM Bx:    RADIOGRAPHIC STUDIES: I have personally reviewed the radiological images as listed and agreed with the findings in the report. No results found.  ASSESSMENT & PLAN:  84 y.o. male with  1. Lytic bone lesions - w/u consistent with newly diagnosed multiple myeloma Labs upon initial presentation from 04/14/18; hypercalcemic with Calcium at 13.8, renal function up form baseline with Creatinine at 1.48, HGB at 11.3. PSA was normal.   03/31/18 CT C/A/P revealed 1. Lytic lesion throughout all visualized vertebra from the lower cervical spine through the sacrum. Epidural extension of tumor T2, T3 and L2 level. Right L3-4 foraminal extension tumor. 2. Lytic lesions involving majority of ribs. Multiple pathologic rib fractures with bony expansion/sub pleural extension of tumor at several levels. 3. Lytic lesions of the scapula, hips bilaterally and pelvis bilaterally. With further progression of tumor, patient may be at risk for pathologic fractures. Pathologic fracture right clavicle incompletely assessed. 4. Circumferential narrowing sigmoid colon. Question possibility of underlying mass (series 2, image 97). Alternatively this could represent muscular hypertrophy/peristalsis. 5. Gastric antrum circumferential narrowing. Cannot exclude mass although this may be related to peristalsis. 6. No primary lung malignancy noted. 7. Matted aortic pulmonary window lymph nodes. 8. Circumferential urinary bladder wall thickening greater anterior dome region. Right lateral bladder  diverticulum. 9. 5 mm low-density lesion pancreatic body (series 5, image 10) unchanged. This is felt unlikely to be a primary malignancy. 10. Right parapelvic 2.2 cm cyst without significant change. Scattered low-density renal lesions bilaterally too small to adequately characterize. Some were present previously. Statistically these are likely cysts. 11. Stable adrenal gland hyperplasia. 12. Gynecomastia. 13. Prostate gland causes slight impression upon the bladder base. 14.  Aortic Atherosclerosis. 15. Sequential pacemaker in place. 16. Gallstones.   04/23/18 PET/CT revealed Innumerable hypermetabolic lytic lesions throughout the skeleton especially involving the spine, ribs, bony pelvis along with some involvement of both proximal femurs. The appearance is compatible with pathologic diagnosis of plasma cell neoplasm could well reflect multiple myeloma. Resulting pathologic fractures of the right lateral clavicle and of multiple bilateral ribs. Lesions are present along the cortical margins of the spinal canal. 2. Both the area and the stomach in the area in the sigmoid colon drawn attention to on prior CT scan appear benign normal today. 3. The confluence of AP window lymph nodes measures  about 1.2 cm in short axis with maximum SUV 3.1, which is mildly above the blood pool merit surveillance. 4. Other imaging findings of potential clinical significance: Aortic Atherosclerosis. Coronary atherosclerosis. Pacemaker noted. Borderline prostatomegaly. Cholelithiasis.  04/21/18 BM Bx revealed Normocellular bone marrow with Plasma Cell neoplasm. Scattered medium sized clusters of kappa-restricted plasma cells (14% aspirate, 10% CD138 immunohistochemistry.   I do suspect that the burden of disease is underestimated in the bone marrow sample given PET/CT findings of innumerable lytic lesions and an M spike of 3.4g   06/08/18 DG Hips bilat which revealed Innumerable lucencies are noted throughout the pelvis and  proximal femurs, similar findings noted on prior PET-CT of 04/23/2018. Findings are consistent patient's known multiple myeloma. Femoral necks are intact. 2.  Aortoiliac atherosclerotic vascular disease.  2. Pathologic right clavicle fracture due to myeloma- resolved 3. Hypercalcemia- resolved.  PLAN:    -Discussed pt labwork today, 06/28/19; blood counts look good, blood chemistries are stable  -Discussed 06/28/2019 MMP - no M spike. -Discussed 05/31/2019 M Protein is not observed  -Pt continues to be in remission from Multiple Myeloma. -The pt has no prohibitive toxicities from continuing C15D1 of Daratumumab at this time -CIDP has been considered in the treatment choice-- remains stable/improved with Daratumumab -Recommend pt continue to monitor sodium intake  -Recommend pt f/u with second dose of COVID19 vaccine as scheduled -Would recommend pt f/u with Dr. Volanda Napoleon for HTN management  -Continue Xgeva every 3 weeks -Will see back in 3 weeks with labs   FOLLOW UP: Continue Daratumumab , Xgeva, labs and MD visit every 4 weeks x 3   . The total time spent in the appointment was 25 minutes and more than 50% was on counseling and direct patient cares.    All of the patient's questions were answered with apparent satisfaction. The patient knows to call the clinic with any problems, questions or concerns.    Sullivan Lone MD [place] AAHIVMS [facility] Hematology/Oncology Physician [facility]  (Office):       [phone] (Work cell):  [phone] (Fax):           [phone]  06/28/2019 9:50 AM  I, Yevette Edwards, am acting as a scribe for Dr. Sullivan Lone.   .I have reviewed the above documentation for accuracy and completeness, and I agree with the above. Brunetta Genera MD

## 2019-06-28 NOTE — Patient Instructions (Signed)
[facility] Discharge Instructions for Patients Receiving Chemotherapy  Today you received the following chemotherapy agents: darzalex  To help prevent nausea and vomiting after your treatment, we encourage you to take your nausea medication as directed    If you develop nausea and vomiting that is not controlled by your nausea medication, call the clinic.   BELOW ARE SYMPTOMS THAT SHOULD BE REPORTED IMMEDIATELY:  *FEVER GREATER THAN 100.5 F  *CHILLS WITH OR WITHOUT FEVER  NAUSEA AND VOMITING THAT IS NOT CONTROLLED WITH YOUR NAUSEA MEDICATION  *UNUSUAL SHORTNESS OF BREATH  *UNUSUAL BRUISING OR BLEEDING  TENDERNESS IN MOUTH AND THROAT WITH OR WITHOUT PRESENCE OF ULCERS  *URINARY PROBLEMS  *BOWEL PROBLEMS  UNUSUAL RASH Items with * indicate a potential emergency and should be followed up as soon as possible.  Feel free to call the clinic should you have any questions or concerns. The clinic phone number is [phone].  Please show the CHEMO ALERT CARD at check-in to the Emergency Department and triage nurse.   

## 2019-06-30 ENCOUNTER — Other Ambulatory Visit: Payer: Self-pay

## 2019-06-30 ENCOUNTER — Telehealth: Payer: Self-pay | Admitting: Hematology

## 2019-06-30 DIAGNOSIS — Z23 Encounter for immunization: Secondary | ICD-10-CM | POA: Diagnosis not present

## 2019-06-30 NOTE — Telephone Encounter (Signed)
Scheduled per 02/15 los, spoke with patient's daughter and patient should be notified.

## 2019-07-01 ENCOUNTER — Ambulatory Visit: Payer: Medicare Other | Admitting: Family Medicine

## 2019-07-01 LAB — MULTIPLE MYELOMA PANEL, SERUM
Albumin SerPl Elph-Mcnc: 3.7 g/dL (ref 2.9–4.4)
Albumin/Glob SerPl: 1.4 (ref 0.7–1.7)
Alpha 1: 0.2 g/dL (ref 0.0–0.4)
Alpha2 Glob SerPl Elph-Mcnc: 0.8 g/dL (ref 0.4–1.0)
B-Globulin SerPl Elph-Mcnc: 1 g/dL (ref 0.7–1.3)
Gamma Glob SerPl Elph-Mcnc: 0.6 g/dL (ref 0.4–1.8)
Globulin, Total: 2.7 g/dL (ref 2.2–3.9)
IgA: 163 mg/dL (ref 61–437)
IgG (Immunoglobin G), Serum: 628 mg/dL (ref 603–1613)
IgM (Immunoglobulin M), Srm: 54 mg/dL (ref 15–143)
Total Protein ELP: 6.4 g/dL (ref 6.0–8.5)

## 2019-07-07 ENCOUNTER — Other Ambulatory Visit: Payer: Self-pay

## 2019-07-07 ENCOUNTER — Ambulatory Visit (INDEPENDENT_AMBULATORY_CARE_PROVIDER_SITE_OTHER): Payer: Medicare Other | Admitting: Family Medicine

## 2019-07-07 VITALS — BP 170/84 | HR 70 | Temp 98.0°F | Wt 217.0 lb

## 2019-07-07 DIAGNOSIS — C9001 Multiple myeloma in remission: Secondary | ICD-10-CM | POA: Diagnosis not present

## 2019-07-07 DIAGNOSIS — I10 Essential (primary) hypertension: Secondary | ICD-10-CM

## 2019-07-07 MED ORDER — METOPROLOL TARTRATE 25 MG PO TABS: 37.5000 mg | ORAL_TABLET | Freq: Two times a day (BID) | ORAL | 3 refills | Status: DC

## 2019-07-07 NOTE — Patient Instructions (Addendum)
We have discontinued vitamin D 50,000 IU, vitamin B12 500 MCG, melatonin 3 mg, tramadol 50 mg, metoprolol 25 mg twice daily.  We will start metoprolol tartrate 37.5 mg twice daily.  A prescription has been written for you to provide to [facility] living.  Work on decreasing the amount of soup and sodium you are eating.  We will continue to check your blood pressures regularly.  Plan to follow-up in 1 month, sooner if needed. Managing Your Hypertension Hypertension is commonly called high blood pressure. This is when the force of your blood pressing against the walls of your arteries is too strong. Arteries are blood vessels that carry blood from your heart throughout your body. Hypertension forces the heart to work harder to pump blood, and may cause the arteries to become narrow or stiff. Having untreated or uncontrolled hypertension can cause heart attack, stroke, kidney disease, and other problems. What are blood pressure readings? A blood pressure reading consists of a higher number over a lower number. Ideally, your blood pressure should be below 120/80. The first ("top") number is called the systolic pressure. It is a measure of the pressure in your arteries as your heart beats. The second ("bottom") number is called the diastolic pressure. It is a measure of the pressure in your arteries as the heart relaxes. What does my blood pressure reading mean? Blood pressure is classified into four stages. Based on your blood pressure reading, your health care provider may use the following stages to determine what type of treatment you need, if any. Systolic pressure and diastolic pressure are measured in a unit called mm Hg. Normal  Systolic pressure: below 123456.  Diastolic pressure: below 80. Elevated  Systolic pressure: [id number].  Diastolic pressure: below 80. Hypertension stage 1  Systolic pressure: 0000000.  Diastolic pressure: [id number]. Hypertension stage 2  Systolic pressure: XX123456 or  above.  Diastolic pressure: 90 or above. What health risks are associated with hypertension? Managing your hypertension is an important responsibility. Uncontrolled hypertension can lead to:  A heart attack.  A stroke.  A weakened blood vessel (aneurysm).  Heart failure.  Kidney damage.  Eye damage.  Metabolic syndrome.  Memory and concentration problems. What changes can I make to manage my hypertension? Hypertension can be managed by making lifestyle changes and possibly by taking medicines. Your health care provider will help you make a plan to bring your blood pressure within a normal range. Eating and drinking   Eat a diet that is high in fiber and potassium, and low in salt (sodium), added sugar, and fat. An example eating plan is called the DASH (Dietary Approaches to Stop Hypertension) diet. To eat this way: ? Eat plenty of fresh fruits and vegetables. Try to fill half of your plate at each meal with fruits and vegetables. ? Eat whole grains, such as whole wheat pasta, brown rice, or whole grain bread. Fill about one quarter of your plate with whole grains. ? Eat low-fat diary products. ? Avoid fatty cuts of meat, processed or cured meats, and poultry with skin. Fill about one quarter of your plate with lean proteins such as fish, chicken without skin, beans, eggs, and tofu. ? Avoid premade and processed foods. These tend to be higher in sodium, added sugar, and fat.  Reduce your daily sodium intake. Most people with hypertension should eat less than 1,500 mg of sodium a day.  Limit alcohol intake to no more than 1 drink a day for nonpregnant women and 2 drinks  a day for men. One drink equals 12 oz of beer, 5 oz of wine, or 1 oz of hard liquor. Lifestyle  Work with your health care provider to maintain a healthy body weight, or to lose weight. Ask what an ideal weight is for you.  Get at least 30 minutes of exercise that causes your heart to beat faster (aerobic  exercise) most days of the week. Activities may include walking, swimming, or biking.  Include exercise to strengthen your muscles (resistance exercise), such as weight lifting, as part of your weekly exercise routine. Try to do these types of exercises for 30 minutes at least 3 days a week.  Do not use any products that contain nicotine or tobacco, such as cigarettes and e-cigarettes. If you need help quitting, ask your health care provider.  Control any long-term (chronic) conditions you have, such as high cholesterol or diabetes. Monitoring  Monitor your blood pressure at home as told by your health care provider. Your personal target blood pressure may vary depending on your medical conditions, your age, and other factors.  Have your blood pressure checked regularly, as often as told by your health care provider. Working with your health care provider  Review all the medicines you take with your health care provider because there may be side effects or interactions.  Talk with your health care provider about your diet, exercise habits, and other lifestyle factors that may be contributing to hypertension.  Visit your health care provider regularly. Your health care provider can help you create and adjust your plan for managing hypertension. Will I need medicine to control my blood pressure? Your health care provider may prescribe medicine if lifestyle changes are not enough to get your blood pressure under control, and if:  Your systolic blood pressure is 130 or higher.  Your diastolic blood pressure is 80 or higher. Take medicines only as told by your health care provider. Follow the directions carefully. Blood pressure medicines must be taken as prescribed. The medicine does not work as well when you skip doses. Skipping doses also puts you at risk for problems. Contact a health care provider if:  You think you are having a reaction to medicines you have taken.  You have repeated  (recurrent) headaches.  You feel dizzy.  You have swelling in your ankles.  You have trouble with your vision. Get help right away if:  You develop a severe headache or confusion.  You have unusual weakness or numbness, or you feel faint.  You have severe pain in your chest or abdomen.  You vomit repeatedly.  You have trouble breathing. Summary  Hypertension is when the force of blood pumping through your arteries is too strong. If this condition is not controlled, it may put you at risk for serious complications.  Your personal target blood pressure may vary depending on your medical conditions, your age, and other factors. For most people, a normal blood pressure is less than 120/80.  Hypertension is managed by lifestyle changes, medicines, or both. Lifestyle changes include weight loss, eating a healthy, low-sodium diet, exercising more, and limiting alcohol. This information is not intended to replace advice given to you by your health care provider. Make sure you discuss any questions you have with your health care provider. Document Revised: 08/21/2018 Document Reviewed: 03/27/2016 Elsevier Patient Education  [place].  Edema  Edema is an abnormal buildup of fluids in the body tissues and under the skin. Swelling of the legs, feet, and  ankles is a common symptom that becomes more likely as you get older. Swelling is also common in looser tissues, like around the eyes. When the affected area is squeezed, the fluid may move out of that spot and leave a dent for a few moments. This dent is called pitting edema. There are many possible causes of edema. Eating too much salt (sodium) and being on your feet or sitting for a long time can cause edema in your legs, feet, and ankles. Hot weather may make edema worse. Common causes of edema include:  Heart failure.  Liver or kidney disease.  Weak leg blood vessels.  Cancer.  An  injury.  Pregnancy.  Medicines.  Being obese.  Low protein levels in the blood. Edema is usually painless. Your skin may look swollen or shiny. Follow these instructions at home:  Keep the affected body part raised (elevated) above the level of your heart when you are sitting or lying down.  Do not sit still or stand for long periods of time.  Do not wear tight clothing. Do not wear garters on your upper legs.  Exercise your legs to get your circulation going. This helps to move the fluid back into your blood vessels, and it may help the swelling go down.  Wear elastic bandages or support stockings to reduce swelling as told by your health care provider.  Eat a low-salt (low-sodium) diet to reduce fluid as told by your health care provider.  Depending on the cause of your swelling, you may need to limit how much fluid you drink (fluid restriction).  Take over-the-counter and prescription medicines only as told by your health care provider. Contact a health care provider if:  Your edema does not get better with treatment.  You have heart, liver, or kidney disease and have symptoms of edema.  You have sudden and unexplained weight gain. Get help right away if:  You develop shortness of breath or chest pain.  You cannot breathe when you lie down.  You develop pain, redness, or warmth in the swollen areas.  You have heart, liver, or kidney disease and suddenly get edema.  You have a fever and your symptoms suddenly get worse. Summary  Edema is an abnormal buildup of fluids in the body tissues and under the skin.  Eating too much salt (sodium) and being on your feet or sitting for a long time can cause edema in your legs, feet, and ankles.  Keep the affected body part raised (elevated) above the level of your heart when you are sitting or lying down. This information is not intended to replace advice given to you by your health care provider. Make sure you discuss any  questions you have with your health care provider. Document Revised: 09/16/2018 Document Reviewed: 06/01/2016 Elsevier Patient Education  [place].

## 2019-07-07 NOTE — Progress Notes (Signed)
Subjective:    Patient ID: Jonathon [name], male    DOB: 30-Dec-1935, 84 y.o.   MRN: [id number]  No chief complaint on file. Patient accompanied by his daughter, [name].  HPI Patient was seen today for follow-up.  Pt notes elevated BP.  BP 156/93, 166/90, 145/85, 196/92, 181/100, 184/64, 174/22 over the last week at [facility] in [facility].  Pt denies HA, CP, SOB, changes in vision.  Taking lasix 40 mg, irbesartan 300 mg, lopressor 25 mg BID.  Pt notes eating more soup.    Pt followed by Oncology for MM.  On C15D1 of Daratumaumab and Xgeva.  Noted after xray with R clavicular fx and lytic bone lesions 03/24/18.  Currently in remission.  Denies pain.  Has not required Tramadol, would like it removed from med list. Pt had his 1st COVID vaccine without issue.  Past Medical History:  Diagnosis Date  . Asthma   . BENIGN PROSTATIC HYPERTROPHY, HX OF 10/25/2008  . Bone metastases ([place]) 04/14/2018  . BRADYCARDIA 2005  . Chronic diastolic congestive heart failure ([place]) 06/05/2016  . CIDP (chronic inflammatory demyelinating polyneuropathy) ([place])   . HYPERTENSION 11/21/2006  . Iron deficiency anemia, unspecified 04/19/2013  . Multiple myeloma ([place]) 04/29/2018  . 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 PACEMAKER, PERMANENT 2005   Gen change 2014 Medtronic Adaptic L dual-chamber pacemaker, serial [id number]   . SVT (supraventricular tachycardia) ([place])   . TOBACCO ABUSE 10/25/2008   Quit 2012    Allergies  Allergen Reactions  . Lexapro [Escitalopram Oxalate]     3/30-08/16/17 ? Serotonin Syndrome    ROS General: Denies fever, chills, night sweats, changes in weight, changes in appetite HEENT: Denies headaches, ear pain, changes in vision, rhinorrhea, sore throat CV: Denies CP, palpitations, SOB, orthopnea Pulm: Denies SOB, cough, wheezing GI: Denies abdominal pain, nausea, vomiting, diarrhea, constipation GU: Denies dysuria, hematuria, frequency, vaginal discharge Msk: Denies muscle  cramps, joint pains Neuro: Denies weakness, numbness, tingling Skin: Denies rashes, bruising Psych: Denies depression, anxiety, hallucinations    Objective:    Blood pressure (!) 170/84, pulse 70, temperature 98 F (36.7 C), weight 217 lb (98.4 kg), SpO2 98 %.  Gen. Pleasant, well-nourished, in no distress, normal affect   HEENT: [place]/AT, face symmetric, PERRLA, EOMI, nares patent without drainage Lungs: no accessory muscle use, CTAB, no wheezes or rales Cardiovascular: RRR, no m/r/g, no peripheral edema Musculoskeletal: No deformities, no cyanosis or clubbing, normal tone Neuro:  A&Ox3, CN II-XII intact, normal gait Skin:  Warm, no lesions/ rash   Wt Readings from Last 3 Encounters:  06/28/19 214 lb 14.4 oz (97.5 kg)  05/31/19 214 lb 12.8 oz (97.4 kg)  05/03/19 215 lb 11.2 oz (97.8 kg)    Lab Results  Component Value Date   WBC 6.6 06/28/2019   HGB 13.6 06/28/2019   HCT 42.1 06/28/2019   PLT 168 06/28/2019   GLUCOSE 102 (H) 06/28/2019   CHOL 202 (H) 01/14/2013   TRIG 206.0 (H) 01/14/2013   HDL 53.60 01/14/2013   LDLDIRECT 126.6 01/14/2013   ALT 12 06/28/2019   AST 14 (L) 06/28/2019   NA 142 06/28/2019   K 4.2 06/28/2019   CL 107 06/28/2019   CREATININE 1.11 06/28/2019   BUN 16 06/28/2019   CO2 24 06/28/2019   TSH 1.28 01/08/2017   PSA 1.53 12/01/2008   INR 1.12 04/17/2018   HGBA1C 6.0 11/05/2013    Assessment/Plan:  Essential hypertension  -uncontrolled -continue irbesartan 300 mg, lasix 40  mg -will increase lopressor from 25 mg to 37.5 mg.   -monitor sodium intake. -continue to monitor readings - Plan: metoprolol tartrate (LOPRESSOR) 25 MG tablet  Multiple myeloma in remission ([place]) -continue Daratumumab and Xgeva -continue f/u with Oncology, Dr. Lovena Le  F/u in 4 wks, sooner if needed  [name], MD

## 2019-07-10 ENCOUNTER — Encounter: Payer: Self-pay | Admitting: Family Medicine

## 2019-07-16 ENCOUNTER — Encounter (HOSPITAL_BASED_OUTPATIENT_CLINIC_OR_DEPARTMENT_OTHER): Payer: Self-pay | Admitting: *Deleted

## 2019-07-16 ENCOUNTER — Emergency Department (HOSPITAL_BASED_OUTPATIENT_CLINIC_OR_DEPARTMENT_OTHER)
Admission: EM | Admit: 2019-07-16 | Discharge: 2019-07-16 | Disposition: A | Discharge status: Home / Self Care | Payer: Medicare Other | Attending: Emergency Medicine | Admitting: Emergency Medicine

## 2019-07-16 ENCOUNTER — Other Ambulatory Visit: Payer: Self-pay

## 2019-07-16 DIAGNOSIS — I10 Essential (primary) hypertension: Secondary | ICD-10-CM

## 2019-07-16 DIAGNOSIS — Z950 Presence of cardiac pacemaker: Secondary | ICD-10-CM | POA: Insufficient documentation

## 2019-07-16 DIAGNOSIS — I110 Hypertensive heart disease with heart failure: Secondary | ICD-10-CM | POA: Diagnosis not present

## 2019-07-16 DIAGNOSIS — Z7982 Long term (current) use of aspirin: Secondary | ICD-10-CM | POA: Insufficient documentation

## 2019-07-16 DIAGNOSIS — I2510 Atherosclerotic heart disease of native coronary artery without angina pectoris: Secondary | ICD-10-CM | POA: Insufficient documentation

## 2019-07-16 DIAGNOSIS — Z87891 Personal history of nicotine dependence: Secondary | ICD-10-CM | POA: Insufficient documentation

## 2019-07-16 DIAGNOSIS — Z888 Allergy status to other drugs, medicaments and biological substances status: Secondary | ICD-10-CM | POA: Insufficient documentation

## 2019-07-16 DIAGNOSIS — I5032 Chronic diastolic (congestive) heart failure: Secondary | ICD-10-CM | POA: Diagnosis not present

## 2019-07-16 DIAGNOSIS — G4489 Other headache syndrome: Secondary | ICD-10-CM | POA: Diagnosis not present

## 2019-07-16 DIAGNOSIS — J45909 Unspecified asthma, uncomplicated: Secondary | ICD-10-CM | POA: Diagnosis not present

## 2019-07-16 DIAGNOSIS — F0390 Unspecified dementia without behavioral disturbance: Secondary | ICD-10-CM | POA: Diagnosis not present

## 2019-07-16 DIAGNOSIS — Z79899 Other long term (current) drug therapy: Secondary | ICD-10-CM | POA: Insufficient documentation

## 2019-07-16 LAB — CBC WITH DIFFERENTIAL/PLATELET
Abs Immature Granulocytes: 0.01 10*3/uL (ref 0.00–0.07)
Basophils Absolute: 0 10*3/uL (ref 0.0–0.1)
Basophils Relative: 1 %
Eosinophils Absolute: 0.3 10*3/uL (ref 0.0–0.5)
Eosinophils Relative: 5 %
HCT: 38.9 % — ABNORMAL LOW (ref 39.0–52.0)
Hemoglobin: 12.5 g/dL — ABNORMAL LOW (ref 13.0–17.0)
Immature Granulocytes: 0 %
Lymphocytes Relative: 46 %
Lymphs Abs: 2.2 10*3/uL (ref 0.7–4.0)
MCH: 27.6 pg (ref 26.0–34.0)
MCHC: 32.1 g/dL (ref 30.0–36.0)
MCV: 85.9 fL (ref 80.0–100.0)
Monocytes Absolute: 0.5 10*3/uL (ref 0.1–1.0)
Monocytes Relative: 10 %
Neutro Abs: 1.8 10*3/uL (ref 1.7–7.7)
Neutrophils Relative %: 38 %
Platelets: 153 10*3/uL (ref 150–400)
RBC: 4.53 MIL/uL (ref 4.22–5.81)
RDW: 14.6 % (ref 11.5–15.5)
WBC: 4.7 10*3/uL (ref 4.0–10.5)
nRBC: 0 % (ref 0.0–0.2)

## 2019-07-16 LAB — URINALYSIS, ROUTINE W REFLEX MICROSCOPIC
Bilirubin Urine: NEGATIVE
Glucose, UA: NEGATIVE mg/dL
Hgb urine dipstick: NEGATIVE
Ketones, ur: NEGATIVE mg/dL
Leukocytes,Ua: NEGATIVE
Nitrite: NEGATIVE
Protein, ur: NEGATIVE mg/dL
Specific Gravity, Urine: 1.015 (ref 1.005–1.030)
pH: 7 (ref 5.0–8.0)

## 2019-07-16 LAB — BASIC METABOLIC PANEL
Anion gap: 8 (ref 5–15)
BUN: 19 mg/dL (ref 8–23)
CO2: 26 mmol/L (ref 22–32)
Calcium: 8.8 mg/dL — ABNORMAL LOW (ref 8.9–10.3)
Chloride: 106 mmol/L (ref 98–111)
Creatinine, Ser: 1.06 mg/dL (ref 0.61–1.24)
GFR calc Af Amer: >60 mL/min (ref >60)
GFR calc non Af Amer: >60 mL/min (ref >60)
Glucose, Bld: 103 mg/dL — ABNORMAL HIGH (ref 70–99)
Potassium: 3.7 mmol/L (ref 3.5–5.1)
Sodium: 140 mmol/L (ref 135–145)

## 2019-07-16 MED ORDER — METOPROLOL TARTRATE 25 MG PO TABS
37.5000 mg | ORAL_TABLET | Freq: Two times a day (BID) | ORAL | Status: DC
  Filled 2019-07-16: qty 1

## 2019-07-16 MED ORDER — FUROSEMIDE 40 MG PO TABS: 40.0000 mg | ORAL_TABLET | Freq: Every day | ORAL | Status: DC

## 2019-07-16 MED ORDER — METOPROLOL TARTRATE 50 MG PO TABS
50.0000 mg | ORAL_TABLET | Freq: Once | ORAL | Status: AC
  Administered 2019-07-16: 50 mg via ORAL
  Filled 2019-07-16: qty 1

## 2019-07-16 MED ORDER — ENALAPRILAT 1.25 MG/ML IV SOLN
1.2500 mg | Freq: Once | INTRAVENOUS | Status: AC
  Administered 2019-07-16: 1.25 mg via INTRAVENOUS
  Filled 2019-07-16: qty 2

## 2019-07-16 MED ORDER — IRBESARTAN 300 MG PO TABS
300.0000 mg | ORAL_TABLET | Freq: Every day | ORAL | Status: DC
  Filled 2019-07-16: qty 1

## 2019-07-16 MED ORDER — LABETALOL HCL 5 MG/ML IV SOLN
20.0000 mg | Freq: Once | INTRAVENOUS | Status: AC
  Administered 2019-07-16: 20 mg via INTRAVENOUS
  Filled 2019-07-16: qty 4

## 2019-07-16 MED ORDER — METOPROLOL TARTRATE 50 MG PO TABS: 50.0000 mg | ORAL_TABLET | Freq: Two times a day (BID) | ORAL | 0 refills | Status: DC

## 2019-07-16 MED ORDER — FUROSEMIDE 40 MG PO TABS
40.0000 mg | ORAL_TABLET | Freq: Every day | ORAL | Status: DC
  Administered 2019-07-16: 40 mg via ORAL
  Filled 2019-07-16: qty 1

## 2019-07-16 NOTE — ED Triage Notes (Addendum)
Arrived ems c/o high bp  Slight ha over left eye  A&O x 4  bp has been running high x 1 month,  Was seen 1 week ago by pcp and adjusted meds

## 2019-07-16 NOTE — ED Provider Notes (Signed)
[facility] EMERGENCY DEPARTMENT Provider Note   CSN: [id number] Arrival date & time: 07/16/19  6270     History Chief Complaint  Patient presents with  . Hypertension    Jonathon [name] is a 84 y.o. male.  Pt presents to the ED today with HTN.  It is not quite clear why the facility called EMS.  Pt's BPs have been running high for the past month.  He did see his PCP on 2/24 and Metoprolol tartrate 37.5 bid was added to his regimen.  He has not taken his meds today as they are given at 0800 at the facility.  Pt had a little headache, but it is gone now.  No cp or sob.          Past Medical History:  Diagnosis Date  . Asthma   . BENIGN PROSTATIC HYPERTROPHY, HX OF 10/25/2008  . Bone metastases ([place]) 04/14/2018  . BRADYCARDIA 2005  . Chronic diastolic congestive heart failure ([place]) 06/05/2016  . CIDP (chronic inflammatory demyelinating polyneuropathy) ([place])   . HYPERTENSION 11/21/2006  . Iron deficiency anemia, unspecified 04/19/2013  . Multiple myeloma ([place]) 04/29/2018  . 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 PACEMAKER, PERMANENT 2005   Gen change 2014 Medtronic Adaptic L dual-chamber pacemaker, serial [id number]   . SVT (supraventricular tachycardia) ([place])   . TOBACCO ABUSE 10/25/2008   Quit 2012    Patient Active Problem List   Diagnosis Date Noted  . Counseling regarding advance care planning and goals of care 04/29/2018  . Multiple myeloma ([place]) 04/29/2018  . Bone metastases ([place]) 04/14/2018  . Hypercalcemia 04/14/2018  . Lytic bone lesions on xray 03/29/2018  . Displaced fracture of lateral end of right clavicle with routine healing 03/29/2018  . Fall 09/05/2017  . Sepsis secondary to UTI ([place]) 09/05/2017  . Urinary retention 09/05/2017  . AKI (acute kidney injury) ([place]) 09/05/2017  . CAD (coronary artery disease) 09/05/2017  . Superficial vein thrombosis 08/19/2017  . Altered mental status   . Metabolic encephalopathy 35/00/9381  . Dementia  ([place]) 08/04/2017  . Seizure ([place]) 08/01/2017  . Thiamine deficiency 05/27/2017  . B12 deficiency 04/30/2017  . Alcoholism ([place]) 01/28/2017  . Acute encephalopathy 12/28/2016  . Hepatic steatosis 06/05/2016  . Physical deconditioning 06/05/2016  . Chronic diastolic congestive heart failure ([place]) 06/05/2016  . Orthostatic syncope   . Elevated liver function tests   . LOC (loss of consciousness) ([place]) 08/10/2014  . CIDP (chronic inflammatory demyelinating polyneuropathy) ([place]) 08/08/2014  . Sustained SVT ([place]) 07/27/2014  . 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 Abnormal LFTs 08/06/2013  . Iron deficiency anemia 04/19/2013  . Muscle weakness 01/13/2013  . External hemorrhoids 03/23/2011  . Symptomatic bradycardia 10/25/2008  . 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 PACEMAKER, PERMANENT 10/25/2008  . Essential hypertension 11/21/2006  . BPH (benign prostatic hyperplasia) 11/21/2006  . History of colonic polyps 11/21/2006    Past Surgical History:  Procedure Laterality Date  . COLONOSCOPY  [id number]  . PACEMAKER INSERTION  2005  . PERMANENT PACEMAKER GENERATOR CHANGE N/A 06/19/2012   Procedure: PERMANENT PACEMAKER GENERATOR CHANGE;  Surgeon: [name], MD; Medtronic Adaptic L dual-chamber pacemaker, serial #[id number]    . SKIN GRAFT Right 1962   wrist       Family History  Problem Relation Age of Onset  . Heart attack Father        Died, 70  . Kidney disease Father   . Parkinson's disease Mother  Died, 86  . Breast cancer Sister        Living, 10  . Diabetes type II Brother        Living, 48  . Breast cancer Sister        Living, 77  . Diabetes Daughter        Living, 32  . Diabetes Son        Living, 34  . Ovarian cancer Daughter   . Colon cancer Neg Hx   . Rectal cancer Neg Hx   . Stomach cancer Neg Hx     Social History   Tobacco Use  . Smoking status: Former Smoker    Packs/day: 0.25    Years: 46.00    Pack years: 11.50    Types: Cigarettes     Start date: 03/16/1965    Quit date: 06/15/2010    Years since quitting: 9.0  . Smokeless tobacco: Never Used  . Tobacco comment: quit 3 years ago  Substance Use Topics  . Alcohol use: No    Alcohol/week: 0.0 standard drinks  . Drug use: No    Home Medications Prior to Admission medications   Medication Sig Start Date End Date Taking? Authorizing Provider  acyclovir (ZOVIRAX) 400 MG tablet Take 1 tablet (400 mg total) by mouth 2 (two) times daily. 05/01/18   Brunetta Genera, MD  aspirin EC 81 MG tablet Take 81 mg by mouth daily.    [provider]  dexamethasone (DECADRON) 4 MG tablet 79m (5 tabs) with breakfast the day after each Daratumumab infusion 05/01/18   KBrunetta Genera MD  flecainide (TAMBOCOR) 50 MG tablet Take 75 mg by mouth daily as needed.    [provider]  furosemide (LASIX) 40 MG tablet Take 40 mg by mouth daily.    [provider]  irbesartan (AVAPRO) 300 MG tablet Take 300 mg by mouth daily.    [provider]  memantine (NAMENDA) 5 MG tablet Take 5 mg by mouth 2 (two) times daily.    [provider]  metoprolol tartrate (LOPRESSOR) 50 MG tablet Take 1 tablet (50 mg total) by mouth 2 (two) times daily. 07/16/19   HIsla Pence MD  ondansetron (ZOFRAN) 8 MG tablet Take 1 tablet (8 mg total) by mouth 2 (two) times daily as needed (Nausea or vomiting). 05/01/18   KBrunetta Genera MD  polyethylene glycol ([facility]/ GFloria Raveling packet Take 17 g by mouth daily. 08/17/17   SDoreatha Lew MD  prochlorperazine (COMPAZINE) 10 MG tablet Take 1 tablet (10 mg total) by mouth every 6 (six) hours as needed (Nausea or vomiting). 05/01/18   KBrunetta Genera MD  Saccharomyces boulardii (FLORASTOR PO) Take 250 mg by mouth 2 (two) times daily.    [provider]  tamsulosin (FLOMAX) 0.4 MG CAPS capsule Take 2 capsules (0.8 mg total) by mouth daily. 08/16/17   SDoreatha Lew MD    Allergies    Lexapro  [escitalopram oxalate]  Review of Systems   Review of Systems  Neurological: Positive for headaches.  All other systems reviewed and are negative.   Physical Exam Updated Vital Signs BP (!) 164/101   Pulse (!) 59   Temp 98.1 F (36.7 C) (Oral)   Resp 18   Ht '5\' 10"'  (1.778 m)   Wt 97.5 kg   SpO2 99%   BMI 30.85 kg/m   Physical Exam Vitals and nursing note reviewed.  Constitutional:      Appearance: Normal  appearance.  HENT:     Head: Normocephalic and atraumatic.     Right Ear: External ear normal.     Left Ear: External ear normal.     Nose: Nose normal.     Mouth/Throat:     Mouth: Mucous membranes are moist.     Pharynx: Oropharynx is clear.  Eyes:     Extraocular Movements: Extraocular movements intact.     Conjunctiva/sclera: Conjunctivae normal.     Pupils: Pupils are equal, round, and reactive to light.  Cardiovascular:     Rate and Rhythm: Normal rate and regular rhythm.     Pulses: Normal pulses.     Heart sounds: Normal heart sounds.  Pulmonary:     Effort: Pulmonary effort is normal.     Breath sounds: Normal breath sounds.  Abdominal:     General: Abdomen is flat. Bowel sounds are normal.     Palpations: Abdomen is soft.  Musculoskeletal:        General: Normal range of motion.     Cervical back: Normal range of motion and neck supple.  Skin:    General: Skin is warm.     Capillary Refill: Capillary refill takes less than 2 seconds.  Neurological:     General: No focal deficit present.     Mental Status: He is alert and oriented to person, place, and time.  Psychiatric:        Mood and Affect: Mood normal.        Behavior: Behavior normal.        Thought Content: Thought content normal.        Judgment: Judgment normal.     ED Results / Procedures / Treatments   Labs (all labs ordered are listed, but only abnormal results are displayed) Labs Reviewed  BASIC METABOLIC PANEL - Abnormal; Notable for the following components:      Result  Value   Glucose, Bld 103 (*)    Calcium 8.8 (*)    All other components within normal limits  CBC WITH DIFFERENTIAL/PLATELET - Abnormal; Notable for the following components:   Hemoglobin 12.5 (*)    HCT 38.9 (*)    All other components within normal limits  URINALYSIS, ROUTINE W REFLEX MICROSCOPIC    EKG EKG Interpretation  Date/Time:  Friday July 16 2019 07:47:09 EST Ventricular Rate:  60 PR Interval:    QRS Duration: 148 QT Interval:  441 QTC Calculation: 441 R Axis:   -26 Text Interpretation: Sinus or ectopic atrial rhythm Right bundle branch block No significant change since last tracing Confirmed by Isla Pence [phone]) on 07/16/2019 8:17:51 AM   Radiology No results found.  Procedures Procedures (including critical care time)  Medications Ordered in ED Medications  furosemide (LASIX) tablet 40 mg (40 mg Oral Given 07/16/19 0808)  metoprolol tartrate (LOPRESSOR) tablet 50 mg (50 mg Oral Given 07/16/19 0808)  enalaprilat (VASOTEC) injection 1.25 mg (1.25 mg Intravenous Given 07/16/19 0809)  labetalol (NORMODYNE) injection 20 mg (20 mg Intravenous Given 07/16/19 1062)    ED Course  I have reviewed the triage vital signs and the nursing notes.  Pertinent labs & imaging results that were available during my care of the patient were reviewed by me and considered in my medical decision making (see chart for details).    MDM Rules/Calculators/A&P                     We gave him his nl lasix 40 mg.  We don't have avapro or metoprolol 37.5 here.  We gave him lopressor 50 and vasotec 1.25 mg.  Pt's bp is still elevated, but he is asymptomatic.  He can go back to the facility and take his Avapro.  I will increase his lopressor to 50 mg bid.  Further medication changes should go through his pcp.  Final Clinical Impression(s) / ED Diagnoses Final diagnoses:  Essential hypertension    Rx / DC Orders ED Discharge Orders         Ordered    metoprolol tartrate (LOPRESSOR) 50  MG tablet  2 times daily     07/16/19 0933           Isla Pence, MD 07/16/19 [phone]

## 2019-07-16 NOTE — Discharge Instructions (Addendum)
Change metoprolol from 37.5 mg twice a day to 50 mg twice a day.  Take the Avapro when you get back to the facility.

## 2019-07-16 NOTE — Telephone Encounter (Signed)
Pt was seen at the office. Nothing further needed

## 2019-07-26 ENCOUNTER — Inpatient Hospital Stay: Payer: Medicare Other

## 2019-07-26 ENCOUNTER — Inpatient Hospital Stay (HOSPITAL_BASED_OUTPATIENT_CLINIC_OR_DEPARTMENT_OTHER): Payer: Medicare Other | Admitting: Hematology

## 2019-07-26 ENCOUNTER — Inpatient Hospital Stay: Payer: Medicare Other | Attending: Hematology

## 2019-07-26 ENCOUNTER — Other Ambulatory Visit: Payer: Self-pay

## 2019-07-26 VITALS — BP 183/88 | HR 60 | Temp 97.8°F | Resp 17

## 2019-07-26 VITALS — BP 183/91 | HR 64 | Temp 98.7°F | Resp 18 | Ht 70.0 in | Wt 218.4 lb

## 2019-07-26 DIAGNOSIS — Z87891 Personal history of nicotine dependence: Secondary | ICD-10-CM | POA: Diagnosis not present

## 2019-07-26 DIAGNOSIS — C9001 Multiple myeloma in remission: Secondary | ICD-10-CM | POA: Diagnosis not present

## 2019-07-26 DIAGNOSIS — C7951 Secondary malignant neoplasm of bone: Secondary | ICD-10-CM

## 2019-07-26 DIAGNOSIS — C9000 Multiple myeloma not having achieved remission: Secondary | ICD-10-CM | POA: Diagnosis not present

## 2019-07-26 DIAGNOSIS — Z7982 Long term (current) use of aspirin: Secondary | ICD-10-CM | POA: Insufficient documentation

## 2019-07-26 DIAGNOSIS — I10 Essential (primary) hypertension: Secondary | ICD-10-CM | POA: Diagnosis not present

## 2019-07-26 DIAGNOSIS — Z5112 Encounter for antineoplastic immunotherapy: Secondary | ICD-10-CM

## 2019-07-26 DIAGNOSIS — Z9689 Presence of other specified functional implants: Secondary | ICD-10-CM | POA: Insufficient documentation

## 2019-07-26 DIAGNOSIS — Z7189 Other specified counseling: Secondary | ICD-10-CM

## 2019-07-26 DIAGNOSIS — Z5111 Encounter for antineoplastic chemotherapy: Secondary | ICD-10-CM | POA: Diagnosis not present

## 2019-07-26 DIAGNOSIS — Z79899 Other long term (current) drug therapy: Secondary | ICD-10-CM | POA: Insufficient documentation

## 2019-07-26 LAB — CMP (CANCER CENTER ONLY)
ALT: 9 U/L (ref 0–44)
AST: 16 U/L (ref 15–41)
Albumin: 3.7 g/dL (ref 3.5–5.0)
Alkaline Phosphatase: 66 U/L (ref 38–126)
Anion gap: 10 (ref 5–15)
BUN: 13 mg/dL (ref 8–23)
CO2: 25 mmol/L (ref 22–32)
Calcium: 8.4 mg/dL — ABNORMAL LOW (ref 8.9–10.3)
Chloride: 106 mmol/L (ref 98–111)
Creatinine: 1.1 mg/dL (ref 0.61–1.24)
GFR, Est AFR Am: >60 mL/min (ref >60)
GFR, Estimated: >60 mL/min (ref >60)
Glucose, Bld: 96 mg/dL (ref 70–99)
Potassium: 4.1 mmol/L (ref 3.5–5.1)
Sodium: 141 mmol/L (ref 135–145)
Total Bilirubin: 0.7 mg/dL (ref 0.3–1.2)
Total Protein: 6.5 g/dL (ref 6.5–8.1)

## 2019-07-26 LAB — CBC WITH DIFFERENTIAL/PLATELET
Abs Immature Granulocytes: 0.01 10*3/uL (ref 0.00–0.07)
Basophils Absolute: 0.1 10*3/uL (ref 0.0–0.1)
Basophils Relative: 1 %
Eosinophils Absolute: 0.3 10*3/uL (ref 0.0–0.5)
Eosinophils Relative: 5 %
HCT: 39.6 % (ref 39.0–52.0)
Hemoglobin: 12.7 g/dL — ABNORMAL LOW (ref 13.0–17.0)
Immature Granulocytes: 0 %
Lymphocytes Relative: 45 %
Lymphs Abs: 3.1 10*3/uL (ref 0.7–4.0)
MCH: 27.4 pg (ref 26.0–34.0)
MCHC: 32.1 g/dL (ref 30.0–36.0)
MCV: 85.3 fL (ref 80.0–100.0)
Monocytes Absolute: 0.6 10*3/uL (ref 0.1–1.0)
Monocytes Relative: 9 %
Neutro Abs: 2.8 10*3/uL (ref 1.7–7.7)
Neutrophils Relative %: 40 %
Platelets: 157 10*3/uL (ref 150–400)
RBC: 4.64 MIL/uL (ref 4.22–5.81)
RDW: 14.4 % (ref 11.5–15.5)
WBC: 6.8 10*3/uL (ref 4.0–10.5)
nRBC: 0 % (ref 0.0–0.2)

## 2019-07-26 MED ORDER — MONTELUKAST SODIUM 10 MG PO TABS
ORAL_TABLET | ORAL | Status: AC
  Filled 2019-07-26: qty 1

## 2019-07-26 MED ORDER — MONTELUKAST SODIUM 10 MG PO TABS
10.0000 mg | ORAL_TABLET | Freq: Once | ORAL | Status: AC
  Administered 2019-07-26: 13:00:00 10 mg via ORAL

## 2019-07-26 MED ORDER — FAMOTIDINE IN NACL 20-0.9 MG/50ML-% IV SOLN
20.0000 mg | Freq: Once | INTRAVENOUS | Status: AC
  Administered 2019-07-26: 13:00:00 20 mg via INTRAVENOUS

## 2019-07-26 MED ORDER — SODIUM CHLORIDE 0.9 % IV SOLN
Freq: Once | INTRAVENOUS | Status: AC
  Filled 2019-07-26: qty 250

## 2019-07-26 MED ORDER — DENOSUMAB 120 MG/1.7ML ~~LOC~~ SOLN
120.0000 mg | Freq: Once | SUBCUTANEOUS | Status: AC
  Administered 2019-07-26: 120 mg via SUBCUTANEOUS

## 2019-07-26 MED ORDER — SODIUM CHLORIDE 0.9 % IV SOLN
16.0000 mg/kg | Freq: Once | INTRAVENOUS | Status: AC
  Administered 2019-07-26: 14:00:00 1600 mg via INTRAVENOUS
  Filled 2019-07-26: qty 80

## 2019-07-26 MED ORDER — ACETAMINOPHEN 325 MG PO TABS
ORAL_TABLET | ORAL | Status: AC
  Filled 2019-07-26: qty 2

## 2019-07-26 MED ORDER — DIPHENHYDRAMINE HCL 25 MG PO CAPS
50.0000 mg | ORAL_CAPSULE | Freq: Once | ORAL | Status: AC
  Administered 2019-07-26: 50 mg via ORAL

## 2019-07-26 MED ORDER — DIPHENHYDRAMINE HCL 25 MG PO CAPS
ORAL_CAPSULE | ORAL | Status: AC
  Filled 2019-07-26: qty 2

## 2019-07-26 MED ORDER — SODIUM CHLORIDE 0.9 % IV SOLN
20.0000 mg | Freq: Once | INTRAVENOUS | Status: AC
  Administered 2019-07-26: 13:00:00 20 mg via INTRAVENOUS
  Filled 2019-07-26: qty 20

## 2019-07-26 MED ORDER — DENOSUMAB 120 MG/1.7ML ~~LOC~~ SOLN
SUBCUTANEOUS | Status: AC
  Filled 2019-07-26: qty 1.7

## 2019-07-26 MED ORDER — ACETAMINOPHEN 325 MG PO TABS
650.0000 mg | ORAL_TABLET | Freq: Once | ORAL | Status: AC
  Administered 2019-07-26: 650 mg via ORAL

## 2019-07-26 MED ORDER — FAMOTIDINE IN NACL 20-0.9 MG/50ML-% IV SOLN
INTRAVENOUS | Status: AC
  Filled 2019-07-26: qty 50

## 2019-07-26 MED ORDER — DIPHENHYDRAMINE HCL 25 MG PO CAPS
ORAL_CAPSULE | ORAL | Status: AC
  Filled 2019-07-26: qty 1

## 2019-07-26 NOTE — Progress Notes (Signed)
HEMATOLOGY/ONCOLOGY CLINIC NOTE  Date of Service: 07/26/2019  Patient Care Team: [name], MD as PCP - General (Family Medicine) [name], MD (Cardiology) [name], MD (Cardiology) [name], DO as Consulting Physician (Neurology)  CHIEF COMPLAINTS/PURPOSE OF CONSULTATION:  Continue mx of myeloma  HISTORY OF PRESENTING ILLNESS:   Jonathon [name] is a wonderful 84 y.o. male who has been referred to [place] by Dr. [name] for evaluation and management of Lytic bone lesions. He is accompanied today by his daughter. The pt reports that he is doing well overall. He is currently a resident at [facility] in [place], [place].   The pt presented to the ED on 03/24/18 with a right distal clavicle fracture, which occurred after trying to get out of bed and pushing himself up with his right arm. He notes that his energy has been noticeably decreased for the last month. He also notes that his lower back has a slight sting when trying to stand up from sitting. He also notes some pain in his lower, right chest wall, which is exacerbated when coughing. The pt has been taking 31m Tramadol which is controlling his pain. He does endorse some weakening of both of his lower extremities in the past 1-2 months, which he attributes somewhat to his recent lower back pain.   The pt denies any bowel abnormalities recently. He notes that he stays very well hydrated, and urinates 4-5 times a night, and does have BPH, and takes Flomax. The pt denies any recent changes in his urination habits.   The pt notes that his appetite decreased for a few months, until he began assisted living. He has lost 9 pounds in the last week.   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 He now uses a walker to ambulate. He notes that the CIDP symptoms are primarily located to his legs.  He sees Dr. [name] Neurology. He took steroids without any symptomatic relief. IVIG was cost-prohibitive for the patient, but was recommended.    Regarding the patient's anemia in April 2019, he denies any obvious bleeding at that time, and denies surgeries.    The pt notes that his last colonoscopy, in 2015, was not concerning, which is corroborated by chart review.   The pt and daughter deny any concerns for memory issues, and the daughter adds his memory is "elephant-like". They are not sure why Namenda is on his medication list.   The pt denies any recent dental procedures, or concerns for dental issues. He has maintained every 6 month dental cleanings for all of his life.   Of note prior to the patient's visit today, pt has had a CT C/A/P completed on 03/31/18 with results revealing IMPRESSION: 1. Lytic lesion throughout all visualized vertebra from the lower cervical spine through the sacrum. Epidural extension of tumor T2, T3 and L2 level. Right L3-4 foraminal extension tumor. 2. Lytic lesions involving majority of ribs. Multiple pathologic rib fractures with bony expansion/sub pleural extension of tumor at several levels. 3. Lytic lesions of the scapula, hips bilaterally and pelvis bilaterally. With further progression of tumor, patient may be at risk for pathologic fractures. Pathologic fracture right clavicle incompletely assessed. 4. Circumferential narrowing sigmoid colon. Question possibility of underlying mass (series 2, image 97). Alternatively this could represent muscular hypertrophy/peristalsis. 5. Gastric  antrum circumferential narrowing. Cannot exclude mass although this may be related to peristalsis. 6. No primary lung malignancy noted. 7. Matted aortic pulmonary window lymph nodes. 8. Circumferential urinary bladder wall thickening greater anterior dome region. Right lateral bladder diverticulum. 9. 5 mm low-density lesion pancreatic body (series 5, image 10) unchanged.  This is felt unlikely to be a primary malignancy. 10. Right parapelvic 2.2 cm cyst without significant change. Scattered low-density renal lesions bila2terally too small to adequately characterize. Some were present previously. Statistically these are likely cysts. 11. Stable adrenal gland hyperplasia. 12. Gynecomastia. 13. Prostate gland causes slight impression upon the bladder base. 14.  Aortic Atherosclerosis. 15. Sequential pacemaker in place. 16. Gallstones.  Most recent lab results (03/25/18) of CBC w/diff and CMP is as follows: all values are WNL except for RBC at 4.14, HGB at 11.3, HCT at 33.6, Chloride at 94, CO2 at 33, Glucose at 100, BUN at 25, Total Protein at 9.4, Albumin at 3.4, Calcium at 13.8, GFR at 58. 03/25/18 PSA, Total was normal at 1.2  On review of systems, pt reports positional lower back pain, lower right chest wall pain, recently decreased energy levels, relatively weaker appetite, weight loss, right clavicle pain, weakening of the lower extremities, moving his bowels well, and denies changes in urination, abdominal pains, changes in breathing, changes in bowel habits, pain along the spine, leg swelling, and any other symptoms.  Interval History:   Jonathon [name] returns today for management, evaluation, and C16D1 Daratumumab treatment of his Multiple Myeloma. The patient's last visit with [place] was on 06/28/2019. The pt reports that he is doing well overall.  The pt reports that his Lopressor dosage was increased. He has also received both doses of the COVID19 vaccine and tolerated them well. Pt has had good energy levels and has continued to eat well.   Lab results today (07/26/19) of CBC w/diff and CMP is as follows: all values are WNL except for Hgb at 12.7, Calcium at 8.4.  On review of systems, pt reports healthy appetite and denies new bone pain, fatigue and any other symptoms.   MEDICAL HISTORY:  Past Medical History:  Diagnosis Date  . Asthma   . BENIGN PROSTATIC  HYPERTROPHY, HX OF 10/25/2008  . Bone metastases ([place]) 04/14/2018  . BRADYCARDIA 2005  . Chronic diastolic congestive heart failure ([place]) 06/05/2016  . CIDP (chronic inflammatory demyelinating polyneuropathy) ([place])   . HYPERTENSION 11/21/2006  . Iron deficiency anemia, unspecified 04/19/2013  . Multiple myeloma ([place]) 04/29/2018  . 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 PACEMAKER, PERMANENT 2005   Gen change 2014 Medtronic Adaptic L dual-chamber pacemaker, serial [id number]   . SVT (supraventricular tachycardia) ([place])   . TOBACCO ABUSE 10/25/2008   Quit 2012    SURGICAL HISTORY: Past Surgical History:  Procedure Laterality Date  . COLONOSCOPY  [id number]  . PACEMAKER INSERTION  2005  . PERMANENT PACEMAKER GENERATOR CHANGE N/A 06/19/2012   Procedure: PERMANENT PACEMAKER GENERATOR CHANGE;  Surgeon: [name], MD; Medtronic Adaptic L dual-chamber pacemaker, serial #[id number]    . SKIN GRAFT Right 1962   wrist    SOCIAL HISTORY: Social History   Socioeconomic History  . Marital status: Divorced    Spouse name: Not on file  . Number of children: 4  . Years of education: Not on file  . Highest education level: Not on file  Occupational History  . Occupation: ENVIROMENTAL Location manager: [employer]  . Occupation: retired  Tobacco Use  . Smoking status: Former Smoker    Packs/day: 0.25    Years: 46.00    Pack years: 11.50    Types: Cigarettes    Start date: 03/16/1965    Quit date: 06/15/2010    Years since quitting: 9.1  . Smokeless tobacco: Never Used  . Tobacco comment: quit 3 years ago  Substance and Sexual Activity  . Alcohol use: No    Alcohol/week: 0.0 standard drinks  . Drug use: No  . Sexual activity: Not on file  Other Topics Concern  . Not on file  Social History Narrative   Has relocated from [place] in 2007. 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  Patient in a facility thru [facility]   Social Determinants of Health   Financial Resource Strain:   .  Difficulty of Paying Living Expenses:   Food Insecurity:   . Worried About Charity fundraiser in the Last Year:   . Arboriculturist in the Last Year:   Transportation Needs:   . Film/video editor (Medical):   Marland Kitchen Lack of Transportation (Non-Medical):   Physical Activity:   . Days of Exercise per Week:   . Minutes of Exercise per Session:   Stress:   . Feeling of Stress :   Social Connections:   . Frequency of Communication with Friends and Family:   . Frequency of Social Gatherings with Friends and Family:   . Attends Religious Services:   . Active Member of Clubs or Organizations:   . Attends Archivist Meetings:   Marland Kitchen Marital Status:   Intimate Partner Violence:   . Fear of Current or Ex-Partner:   . Emotionally Abused:   Marland Kitchen Physically Abused:   . Sexually Abused:     FAMILY HISTORY: Family History  Problem Relation Age of Onset  . Heart attack Father        Died, 16  . Kidney disease Father   . Parkinson's disease Mother        Died, 60  . Breast cancer Sister        Living, 66  . Diabetes type II Brother        Living, 52  . Breast cancer Sister        Living, 82  . Diabetes Daughter        Living, 13  . Diabetes Son        Living, 33  . Ovarian cancer Daughter   . Colon cancer Neg Hx   . Rectal cancer Neg Hx   . Stomach cancer Neg Hx     ALLERGIES:  is allergic to lexapro [escitalopram oxalate].  MEDICATIONS:  Current Outpatient Medications  Medication Sig Dispense Refill  . acyclovir (ZOVIRAX) 400 MG tablet Take 1 tablet (400 mg total) by mouth 2 (two) times daily. 60 tablet 11  . aspirin EC 81 MG tablet Take 81 mg by mouth daily.    Marland Kitchen dexamethasone (DECADRON) 4 MG tablet 41m (5 tabs) with breakfast the day after each Daratumumab infusion 20 tablet 4  . flecainide (TAMBOCOR) 50 MG tablet Take 75 mg by mouth daily as needed.    . furosemide (LASIX) 40 MG tablet Take 40 mg by mouth daily.    . irbesartan (AVAPRO) 300 MG tablet Take 300 mg by  mouth daily.    . memantine (NAMENDA) 5 MG tablet Take 5 mg by mouth 2 (two) times daily.    . metoprolol tartrate (LOPRESSOR) 50 MG tablet Take 1 tablet (  50 mg total) by mouth 2 (two) times daily. 60 tablet 0  . ondansetron (ZOFRAN) 8 MG tablet Take 1 tablet (8 mg total) by mouth 2 (two) times daily as needed (Nausea or vomiting). 30 tablet 1  . polyethylene glycol (MIRALAX / GLYCOLAX) packet Take 17 g by mouth daily. 14 each 0  . prochlorperazine (COMPAZINE) 10 MG tablet Take 1 tablet (10 mg total) by mouth every 6 (six) hours as needed (Nausea or vomiting). 30 tablet 1  . Saccharomyces boulardii (FLORASTOR PO) Take 250 mg by mouth 2 (two) times daily.    . tamsulosin (FLOMAX) 0.4 MG CAPS capsule Take 2 capsules (0.8 mg total) by mouth daily. 90 capsule 0   No current facility-administered medications for this visit.   Facility-Administered Medications Ordered in Other Visits  Medication Dose Route Frequency Provider Last Rate Last Admin  . sodium chloride flush (NS) 0.9 % injection 10 mL  10 mL Intracatheter PRN Brunetta Genera, MD        REVIEW OF SYSTEMS:   A 10+ POINT REVIEW OF SYSTEMS WAS OBTAINED including neurology, dermatology, psychiatry, cardiac, respiratory, lymph, extremities, GI, GU, Musculoskeletal, constitutional, breasts, reproductive, HEENT.  All pertinent positives are noted in the HPI.  All others are negative.   PHYSICAL EXAMINATION: ECOG PERFORMANCE STATUS: 2 - Symptomatic, <50% confined to bed  .BP (!) 183/91 (BP Location: Left Arm, Patient Position: Sitting) Comment: Notified Nurse of BP  Pulse 64   Temp 98.7 F (37.1 C) (Temporal)   Resp 18   Ht '5\' 10"'  (1.778 m)   Wt 218 lb 6.4 oz (99.1 kg)   SpO2 100%   BMI 31.34 kg/m   GENERAL:alert, in no acute distress and comfortable SKIN: no acute rashes, no significant lesions EYES: conjunctiva are pink and non-injected, sclera anicteric OROPHARYNX: MMM, no exudates, no oropharyngeal erythema or  ulceration NECK: supple, no JVD LYMPH:  no palpable lymphadenopathy in the cervical, axillary or inguinal regions LUNGS: clear to auscultation b/l with normal respiratory effort HEART: regular rate & rhythm ABDOMEN:  normoactive bowel sounds , non tender, not distended. No palpable hepatosplenomegaly.  Extremity: no pedal edema PSYCH: alert & oriented x 3 with fluent speech NEURO: no focal motor/sensory deficits  LABORATORY DATA:  I have reviewed the data as listed  . CBC Latest Ref Rng & Units 07/26/2019 07/16/2019 06/28/2019  WBC 4.0 - 10.5 K/uL 6.8 4.7 6.6  Hemoglobin 13.0 - 17.0 g/dL 12.7(L) 12.5(L) 13.6  Hematocrit 39.0 - 52.0 % 39.6 38.9(L) 42.1  Platelets 150 - 400 K/uL 157 153 168    . CMP Latest Ref Rng & Units 07/26/2019 07/16/2019 06/28/2019  Glucose 70 - 99 mg/dL 96 103(H) 102(H)  BUN 8 - 23 mg/dL '13 19 16  ' Creatinine 0.61 - 1.24 mg/dL 1.10 1.06 1.11  Sodium 135 - 145 mmol/L 141 140 142  Potassium 3.5 - 5.1 mmol/L 4.1 3.7 4.2  Chloride 98 - 111 mmol/L 106 106 107  CO2 22 - 32 mmol/L '25 26 24  ' Calcium 8.9 - 10.3 mg/dL 8.4(L) 8.8(L) 9.3  Total Protein 6.5 - 8.1 g/dL 6.5 - 7.1  Total Bilirubin 0.3 - 1.2 mg/dL 0.7 - 0.6  Alkaline Phos 38 - 126 U/L 66 - 66  AST 15 - 41 U/L 16 - 14(L)  ALT 0 - 44 U/L 9 - 12   12/14/2018 MMP    12/14/2018 MMP    04/17/18 BM Bx:    RADIOGRAPHIC STUDIES: I have personally reviewed the radiological images  as listed and agreed with the findings in the report. No results found.  ASSESSMENT & PLAN:  84 y.o. male with  1. Lytic bone lesions - w/u consistent with newly diagnosed multiple myeloma Labs upon initial presentation from 04/14/18; hypercalcemic with Calcium at 13.8, renal function up form baseline with Creatinine at 1.48, HGB at 11.3. PSA was normal.   03/31/18 CT C/A/P revealed 1. Lytic lesion throughout all visualized vertebra from the lower cervical spine through the sacrum. Epidural extension of tumor T2, T3 and L2 level.  Right L3-4 foraminal extension tumor. 2. Lytic lesions involving majority of ribs. Multiple pathologic rib fractures with bony expansion/sub pleural extension of tumor at several levels. 3. Lytic lesions of the scapula, hips bilaterally and pelvis bilaterally. With further progression of tumor, patient may be at risk for pathologic fractures. Pathologic fracture right clavicle incompletely assessed. 4. Circumferential narrowing sigmoid colon. Question possibility of underlying mass (series 2, image 97). Alternatively this could represent muscular hypertrophy/peristalsis. 5. Gastric antrum circumferential narrowing. Cannot exclude mass although this may be related to peristalsis. 6. No primary lung malignancy noted. 7. Matted aortic pulmonary window lymph nodes. 8. Circumferential urinary bladder wall thickening greater anterior dome region. Right lateral bladder diverticulum. 9. 5 mm low-density lesion pancreatic body (series 5, image 10) unchanged. This is felt unlikely to be a primary malignancy. 10. Right parapelvic 2.2 cm cyst without significant change. Scattered low-density renal lesions bilaterally too small to adequately characterize. Some were present previously. Statistically these are likely cysts. 11. Stable adrenal gland hyperplasia. 12. Gynecomastia. 13. Prostate gland causes slight impression upon the bladder base. 14.  Aortic Atherosclerosis. 15. Sequential pacemaker in place. 16. Gallstones.   04/23/18 PET/CT revealed Innumerable hypermetabolic lytic lesions throughout the skeleton especially involving the spine, ribs, bony pelvis along with some involvement of both proximal femurs. The appearance is compatible with pathologic diagnosis of plasma cell neoplasm could well reflect multiple myeloma. Resulting pathologic fractures of the right lateral clavicle and of multiple bilateral ribs. Lesions are present along the cortical margins of the spinal canal. 2. Both the area and the stomach in the  area in the sigmoid colon drawn attention to on prior CT scan appear benign normal today. 3. The confluence of AP window lymph nodes measures about 1.2 cm in short axis with maximum SUV 3.1, which is mildly above the blood pool merit surveillance. 4. Other imaging findings of potential clinical significance: Aortic Atherosclerosis. Coronary atherosclerosis. Pacemaker noted. Borderline prostatomegaly. Cholelithiasis.  04/21/18 BM Bx revealed Normocellular bone marrow with Plasma Cell neoplasm. Scattered medium sized clusters of kappa-restricted plasma cells (14% aspirate, 10% CD138 immunohistochemistry.   I do suspect that the burden of disease is underestimated in the bone marrow sample given PET/CT findings of innumerable lytic lesions and an M spike of 3.4g   06/08/18 DG Hips bilat which revealed Innumerable lucencies are noted throughout the pelvis and proximal femurs, similar findings noted on prior PET-CT of 04/23/2018. Findings are consistent patient's known multiple myeloma. Femoral necks are intact. 2.  Aortoiliac atherosclerotic vascular disease.  2. Pathologic right clavicle fracture due to myeloma- resolved 3. Hypercalcemia- resolved.  PLAN:    -Discussed pt labwork today, 07/26/19; blood counts look good, Calcium is a little low -Pt continues to be in remission from Multiple Myeloma, based on last MMP -The pt has no prohibitive toxicities from continuing C16D1 of Daratumumab at this time -Discussed the possibility of discontinuing maintenance Daratumumab after C16 due to 6 months of Multiple Myeloma remission. Also  discussed continuing until 1 year and 2 years of Multiple Myeloma remission. -Advised pt that continuing maintenance Daratumumab may lengthen his time to recurrence/progression  -Advised pt that Daratumumab is also likely helping his CIDP -Pt would like to watch with labs and clinic visits at this time -Will discontinue Daratumumab after today -Recommend pt begin Calcium  supplement  -Continue Xgeva every 8 weeks -Will see back in 8 weeks, with labs 1 week prior  FOLLOW UP: Change Xgeva to every 2 months- plz schedule next 3 doses Holding Daratumumab appointments after today RTC with Dr Irene Limbo with labs in 2 months with Daratumumab dose. Labs 1 week prior to clinic visit   The total time spent in the appt was 30 minutes and more than 50% was on counseling and direct patient cares.  All of the patient's questions were answered with apparent satisfaction. The patient knows to call the clinic with any problems, questions or concerns.    Sullivan Lone MD [place] AAHIVMS [facility] Hematology/Oncology Physician [facility]  (Office):       [phone] (Work cell):  [phone] (Fax):           [phone]  07/26/2019 10:38 AM  I, Yevette Edwards, am acting as a scribe for Dr. Sullivan Lone.   .I have reviewed the above documentation for accuracy and completeness, and I agree with the above. Brunetta Genera MD

## 2019-07-26 NOTE — Patient Instructions (Signed)
[facility] Discharge Instructions for Patients Receiving Chemotherapy  Today you received the following chemotherapy agents: Darzalex  To help prevent nausea and vomiting after your treatment, we encourage you to take your nausea medication as directed.    If you develop nausea and vomiting that is not controlled by your nausea medication, call the clinic.   BELOW ARE SYMPTOMS THAT SHOULD BE REPORTED IMMEDIATELY:  *FEVER GREATER THAN 100.5 F  *CHILLS WITH OR WITHOUT FEVER  NAUSEA AND VOMITING THAT IS NOT CONTROLLED WITH YOUR NAUSEA MEDICATION  *UNUSUAL SHORTNESS OF BREATH  *UNUSUAL BRUISING OR BLEEDING  TENDERNESS IN MOUTH AND THROAT WITH OR WITHOUT PRESENCE OF ULCERS  *URINARY PROBLEMS  *BOWEL PROBLEMS  UNUSUAL RASH Items with * indicate a potential emergency and should be followed up as soon as possible.  Feel free to call the clinic should you have any questions or concerns. The clinic phone number is [phone].  Please show the CHEMO ALERT CARD at check-in to the Emergency Department and triage nurse.   

## 2019-07-29 LAB — MULTIPLE MYELOMA PANEL, SERUM
Albumin SerPl Elph-Mcnc: 3.5 g/dL (ref 2.9–4.4)
Albumin/Glob SerPl: 1.4 (ref 0.7–1.7)
Alpha 1: 0.2 g/dL (ref 0.0–0.4)
Alpha2 Glob SerPl Elph-Mcnc: 0.8 g/dL (ref 0.4–1.0)
B-Globulin SerPl Elph-Mcnc: 1.1 g/dL (ref 0.7–1.3)
Gamma Glob SerPl Elph-Mcnc: 0.5 g/dL (ref 0.4–1.8)
Globulin, Total: 2.6 g/dL (ref 2.2–3.9)
IgA: 162 mg/dL (ref 61–437)
IgG (Immunoglobin G), Serum: 563 mg/dL — ABNORMAL LOW (ref 603–1613)
IgM (Immunoglobulin M), Srm: 49 mg/dL (ref 15–143)
Total Protein ELP: 6.1 g/dL (ref 6.0–8.5)

## 2019-08-12 ENCOUNTER — Telehealth: Payer: Self-pay | Admitting: Hematology

## 2019-08-12 NOTE — Telephone Encounter (Signed)
Scheduled per 03/15 los, called patient's daughter and patient should be notified of upcoming appointments.

## 2019-08-17 DIAGNOSIS — M79674 Pain in right toe(s): Secondary | ICD-10-CM | POA: Diagnosis not present

## 2019-08-17 DIAGNOSIS — B351 Tinea unguium: Secondary | ICD-10-CM | POA: Diagnosis not present

## 2019-08-17 DIAGNOSIS — M79675 Pain in left toe(s): Secondary | ICD-10-CM | POA: Diagnosis not present

## 2019-08-24 ENCOUNTER — Ambulatory Visit: Payer: Medicare Other

## 2019-08-24 ENCOUNTER — Ambulatory Visit: Payer: Medicare Other | Admitting: Hematology

## 2019-08-24 ENCOUNTER — Other Ambulatory Visit: Payer: Medicare Other

## 2019-09-14 ENCOUNTER — Ambulatory Visit (INDEPENDENT_AMBULATORY_CARE_PROVIDER_SITE_OTHER): Payer: Medicare Other | Admitting: Internal Medicine

## 2019-09-14 ENCOUNTER — Other Ambulatory Visit: Payer: Self-pay

## 2019-09-14 ENCOUNTER — Encounter (INDEPENDENT_AMBULATORY_CARE_PROVIDER_SITE_OTHER): Payer: Self-pay

## 2019-09-14 ENCOUNTER — Encounter: Payer: Self-pay | Admitting: Internal Medicine

## 2019-09-14 VITALS — BP 150/84 | HR 69 | Ht 71.0 in | Wt 213.0 lb

## 2019-09-14 DIAGNOSIS — Z950 Presence of cardiac pacemaker: Secondary | ICD-10-CM

## 2019-09-14 DIAGNOSIS — I471 Supraventricular tachycardia: Secondary | ICD-10-CM | POA: Diagnosis not present

## 2019-09-14 DIAGNOSIS — I10 Essential (primary) hypertension: Secondary | ICD-10-CM | POA: Diagnosis not present

## 2019-09-14 DIAGNOSIS — R001 Bradycardia, unspecified: Secondary | ICD-10-CM

## 2019-09-14 NOTE — Patient Instructions (Signed)
Medication Instructions:  Your physician recommends that you continue on your current medications as directed. Please refer to the Current Medication list given to you today.  Labwork: None ordered.  Testing/Procedures: None ordered.  Follow-Up: Your physician wants you to follow-up in: one year with Dr. Lovena Le.   You will receive a reminder letter in the mail two months in advance. If you don't receive a letter, please call our office to schedule the follow-up appointment.  Remote monitoring is used to monitor your Pacemaker from home.   Device clinic will call you to get you set up with remote monitoring.  Device clinic- [phone]  Any Other Special Instructions Will Be Listed Below (If Applicable).  If you need a refill on your cardiac medications before your next appointment, please call your pharmacy.

## 2019-09-14 NOTE — Progress Notes (Signed)
HPI Jonathon [name] returns today for followup. He is a pleasant 84 yo man with a h/o sinus node dysfunction, PAF/AT and HTN, s/p PPM insertion. He has CIDP and recently diagnosed with multiple myeloma and is on immune therapy. Despite his serious medical problems he continues to maintain a very positive attitude. He had gained weight but then lost some. He has not had chest pain or sob. No edema.  Allergies  Allergen Reactions  . Lexapro [Escitalopram Oxalate]     3/30-08/16/17 ? Serotonin Syndrome     Current Outpatient Medications  Medication Sig Dispense Refill  . acyclovir (ZOVIRAX) 400 MG tablet Take 1 tablet (400 mg total) by mouth 2 (two) times daily. 60 tablet 11  . aspirin EC 81 MG tablet Take 81 mg by mouth daily.    Marland Kitchen dexamethasone (DECADRON) 4 MG tablet 50m (5 tabs) with breakfast the day after each Daratumumab infusion 20 tablet 4  . diltiazem (TIAZAC) 240 MG 24 hr capsule Take 1 capsule by mouth daily.    . flecainide (TAMBOCOR) 50 MG tablet Take 75 mg by mouth daily as needed.    . furosemide (LASIX) 40 MG tablet Take 40 mg by mouth daily.    . irbesartan (AVAPRO) 300 MG tablet Take 300 mg by mouth daily.    . memantine (NAMENDA) 5 MG tablet Take 5 mg by mouth 2 (two) times daily.    . metoprolol tartrate (LOPRESSOR) 50 MG tablet Take 1 tablet (50 mg total) by mouth 2 (two) times daily. 60 tablet 0  . ondansetron (ZOFRAN) 8 MG tablet Take 1 tablet (8 mg total) by mouth 2 (two) times daily as needed (Nausea or vomiting). 30 tablet 1  . polyethylene glycol (MIRALAX / GLYCOLAX) packet Take 17 g by mouth daily. 14 each 0  . prochlorperazine (COMPAZINE) 10 MG tablet Take 1 tablet (10 mg total) by mouth every 6 (six) hours as needed (Nausea or vomiting). 30 tablet 1  . Saccharomyces boulardii (FLORASTOR PO) Take 250 mg by mouth 2 (two) times daily.    . tamsulosin (FLOMAX) 0.4 MG CAPS capsule Take 2 capsules (0.8 mg total) by mouth daily. 90 capsule 0   No current  facility-administered medications for this visit.   Facility-Administered Medications Ordered in Other Visits  Medication Dose Route Frequency Provider Last Rate Last Admin  . sodium chloride flush (NS) 0.9 % injection 10 mL  10 mL Intracatheter PRN KBrunetta Genera MD         Past Medical History:  Diagnosis Date  . Asthma   . BENIGN PROSTATIC HYPERTROPHY, HX OF 10/25/2008  . Bone metastases ([facility] 04/14/2018  . BRADYCARDIA 2005  . Chronic diastolic congestive heart failure ([facility] 06/05/2016  . CIDP (chronic inflammatory demyelinating polyneuropathy) ([facility]   . HYPERTENSION 11/21/2006  . Iron deficiency anemia, unspecified 04/19/2013  . Multiple myeloma ([facility] 04/29/2018  . 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 PACEMAKER, PERMANENT 2005   Gen change 2014 Medtronic Adaptic L dual-chamber pacemaker, serial #[id number]   . SVT (supraventricular tachycardia) ([facility]   . TOBACCO ABUSE 10/25/2008   Quit 2012    ROS:   All systems reviewed and negative except as noted in the HPI.   Past Surgical History:  Procedure Laterality Date  . COLONOSCOPY  [id number] . PACEMAKER INSERTION  2005  . PERMANENT PACEMAKER GENERATOR CHANGE N/A 06/19/2012   Procedure: PERMANENT PACEMAKER GENERATOR CHANGE;  Surgeon: GEvans Lance MD; Medtronic Adaptic L dual-chamber pacemaker,  serial #[id number]    . SKIN GRAFT Right 1962   wrist     Family History  Problem Relation Age of Onset  . Heart attack Father        Died, 65  . Kidney disease Father   . Parkinson's disease Mother        Died, 86  . Breast cancer Sister        Living, 70  . Diabetes type II Brother        Living, 67  . Breast cancer Sister        Living, 64  . Diabetes Daughter        Living, 51  . Diabetes Son        Living, 50  . Ovarian cancer Daughter   . Colon cancer Neg Hx   . Rectal cancer Neg Hx   . Stomach cancer Neg Hx      Social History   Socioeconomic History  . Marital status: Divorced     Spouse name: Not on file  . Number of children: 4  . Years of education: Not on file  . Highest education level: Not on file  Occupational History  . Occupation: ENVIROMENTAL SERVICE    Employer: [employer]  . Occupation: retired  Tobacco Use  . Smoking status: Former Smoker    Packs/day: 0.25    Years: 46.00    Pack years: 11.50    Types: Cigarettes    Start date: 03/16/1965    Quit date: 06/15/2010    Years since quitting: 9.2  . Smokeless tobacco: Never Used  . Tobacco comment: quit 3 years ago  Substance and Sexual Activity  . Alcohol use: No    Alcohol/week: 0.0 standard drinks  . Drug use: No  . Sexual activity: Not on file  Other Topics Concern  . Not on file  Social History Narrative   Has relocated from [place] in 2007. 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  Patient in a facility thru [facility]   Social Determinants of Health   Financial Resource Strain:   . Difficulty of Paying Living Expenses:   Food Insecurity:   . Worried About Running Out of Food in the Last Year:   . Ran Out of Food in the Last Year:   Transportation Needs:   . Lack of Transportation (Medical):   . Lack of Transportation (Non-Medical):   Physical Activity:   . Days of Exercise per Week:   . Minutes of Exercise per Session:   Stress:   . Feeling of Stress :   Social Connections:   . Frequency of Communication with Friends and Family:   . Frequency of Social Gatherings with Friends and Family:   . Attends Religious Services:   . Active Member of Clubs or Organizations:   . Attends Club or Organization Meetings:   . Marital Status:   Intimate Partner Violence:   . Fear of Current or Ex-Partner:   . Emotionally Abused:   . Physically Abused:   . Sexually Abused:      BP (!) 150/84   Pulse 69   Ht 5' 11" (1.803 m)   Wt 213 lb (96.6 kg)   SpO2 96%   BMI 29.71 kg/m   Physical Exam:  Well appearing NAD HEENT: Unremarkable Neck:  No JVD, no thyromegally Lymphatics:  No  adenopathy Back:  No CVA tenderness Lungs:  Clear with no wheezes HEART:  Regular rate rhythm, no murmurs, no rubs, no clicks Abd:    soft, positive bowel sounds, no organomegally, no rebound, no guarding Ext:  2 plus pulses, no edema, no cyanosis, no clubbing Skin:  No rashes no nodules Neuro:  CN II through XII intact, motor grossly intact  DEVICE  Normal device function.  See PaceArt for details.   Assess/Plan: 1. AT  - his arrhythmias are well controlled. He will continue low dose flecainide 2. Sinus node dysfunction - he is asymptomatic, s/p PPM insertion. 3. PPM -his Medtronic DDD PM is working normally. We will recheck in several months. 4. HTN - his bp today is a little high. He has had labile pressures in the past so I have asked him to continue his current meds but to call if he has much higher bp's.  Mikle Bosworth.D.

## 2019-09-15 ENCOUNTER — Telehealth: Payer: Self-pay

## 2019-09-15 NOTE — Telephone Encounter (Signed)
Spoke with pt daughter.  She states when they moved pt to nursing facility they did not bring his old bedside monitor with him.  They will need new monitor.  New monitor ordered to be sent to [facility] at [address] [place], [place] [address]

## 2019-09-15 NOTE — Telephone Encounter (Signed)
-----   Message from Damian Leavell, RN sent at 09/14/2019  3:57 PM EDT ----- Regarding: remote checks It looks like this Pt is remote capable, but hasn't submitted remotes in a couple of years.  Can we get him set up with remote checks again if possible?  Sonia Baller

## 2019-09-17 ENCOUNTER — Inpatient Hospital Stay: Payer: Medicare Other | Attending: Hematology

## 2019-09-17 ENCOUNTER — Other Ambulatory Visit: Payer: Self-pay

## 2019-09-17 DIAGNOSIS — Z5112 Encounter for antineoplastic immunotherapy: Secondary | ICD-10-CM

## 2019-09-17 DIAGNOSIS — I110 Hypertensive heart disease with heart failure: Secondary | ICD-10-CM | POA: Diagnosis not present

## 2019-09-17 DIAGNOSIS — C7951 Secondary malignant neoplasm of bone: Secondary | ICD-10-CM | POA: Insufficient documentation

## 2019-09-17 DIAGNOSIS — C9001 Multiple myeloma in remission: Secondary | ICD-10-CM | POA: Insufficient documentation

## 2019-09-17 DIAGNOSIS — Z79899 Other long term (current) drug therapy: Secondary | ICD-10-CM | POA: Insufficient documentation

## 2019-09-17 DIAGNOSIS — I5032 Chronic diastolic (congestive) heart failure: Secondary | ICD-10-CM | POA: Insufficient documentation

## 2019-09-17 DIAGNOSIS — Z87891 Personal history of nicotine dependence: Secondary | ICD-10-CM | POA: Diagnosis not present

## 2019-09-17 DIAGNOSIS — Z9225 Personal history of immunosupression therapy: Secondary | ICD-10-CM | POA: Insufficient documentation

## 2019-09-17 LAB — CMP (CANCER CENTER ONLY)
ALT: 10 U/L (ref 0–44)
AST: 16 U/L (ref 15–41)
Albumin: 3.7 g/dL (ref 3.5–5.0)
Alkaline Phosphatase: 67 U/L (ref 38–126)
Anion gap: 11 (ref 5–15)
BUN: 15 mg/dL (ref 8–23)
CO2: 28 mmol/L (ref 22–32)
Calcium: 8.5 mg/dL — ABNORMAL LOW (ref 8.9–10.3)
Chloride: 105 mmol/L (ref 98–111)
Creatinine: 1.11 mg/dL (ref 0.61–1.24)
GFR, Est AFR Am: >60 mL/min (ref >60)
GFR, Estimated: >60 mL/min (ref >60)
Glucose, Bld: 87 mg/dL (ref 70–99)
Potassium: 3.6 mmol/L (ref 3.5–5.1)
Sodium: 144 mmol/L (ref 135–145)
Total Bilirubin: 0.9 mg/dL (ref 0.3–1.2)
Total Protein: 6.7 g/dL (ref 6.5–8.1)

## 2019-09-17 LAB — CBC WITH DIFFERENTIAL/PLATELET
Abs Immature Granulocytes: 0.01 10*3/uL (ref 0.00–0.07)
Basophils Absolute: 0.1 10*3/uL (ref 0.0–0.1)
Basophils Relative: 1 %
Eosinophils Absolute: 0.2 10*3/uL (ref 0.0–0.5)
Eosinophils Relative: 3 %
HCT: 41.5 % (ref 39.0–52.0)
Hemoglobin: 13 g/dL (ref 13.0–17.0)
Immature Granulocytes: 0 %
Lymphocytes Relative: 50 %
Lymphs Abs: 3.1 10*3/uL (ref 0.7–4.0)
MCH: 27.5 pg (ref 26.0–34.0)
MCHC: 31.3 g/dL (ref 30.0–36.0)
MCV: 87.9 fL (ref 80.0–100.0)
Monocytes Absolute: 0.6 10*3/uL (ref 0.1–1.0)
Monocytes Relative: 9 %
Neutro Abs: 2.3 10*3/uL (ref 1.7–7.7)
Neutrophils Relative %: 37 %
Platelets: 153 10*3/uL (ref 150–400)
RBC: 4.72 MIL/uL (ref 4.22–5.81)
RDW: 14.6 % (ref 11.5–15.5)
WBC: 6.3 10*3/uL (ref 4.0–10.5)
nRBC: 0 % (ref 0.0–0.2)

## 2019-09-17 LAB — VITAMIN D 25 HYDROXY (VIT D DEFICIENCY, FRACTURES): Vit D, 25-Hydroxy: 42.24 ng/mL (ref 30–100)

## 2019-09-17 NOTE — Telephone Encounter (Signed)
New monitor shipped 09/16/19 per [facility].

## 2019-09-20 LAB — MULTIPLE MYELOMA PANEL, SERUM
Albumin SerPl Elph-Mcnc: 3.4 g/dL (ref 2.9–4.4)
Albumin/Glob SerPl: 1.4 (ref 0.7–1.7)
Alpha 1: 0.2 g/dL (ref 0.0–0.4)
Alpha2 Glob SerPl Elph-Mcnc: 0.8 g/dL (ref 0.4–1.0)
B-Globulin SerPl Elph-Mcnc: 1 g/dL (ref 0.7–1.3)
Gamma Glob SerPl Elph-Mcnc: 0.6 g/dL (ref 0.4–1.8)
Globulin, Total: 2.6 g/dL (ref 2.2–3.9)
IgA: 156 mg/dL (ref 61–437)
IgG (Immunoglobin G), Serum: 596 mg/dL — ABNORMAL LOW (ref 603–1613)
IgM (Immunoglobulin M), Srm: 47 mg/dL (ref 15–143)
Total Protein ELP: 6 g/dL (ref 6.0–8.5)

## 2019-09-21 ENCOUNTER — Ambulatory Visit: Payer: Medicare Other

## 2019-09-21 ENCOUNTER — Other Ambulatory Visit: Payer: Medicare Other

## 2019-09-21 ENCOUNTER — Ambulatory Visit: Payer: Medicare Other | Admitting: Hematology

## 2019-09-23 NOTE — Progress Notes (Signed)
HEMATOLOGY/ONCOLOGY CLINIC NOTE  Date of Service: 09/24/2019  Patient Care Team: [name], MD as PCP - General (Family Medicine) [name], MD (Cardiology) [name], MD (Cardiology) [name], DO as Consulting Physician (Neurology)  CHIEF COMPLAINTS/PURPOSE OF CONSULTATION:  Continue mx of myeloma  HISTORY OF PRESENTING ILLNESS:   Jonathon [name] is a wonderful 84 y.o. male who has been referred to [place] by Dr. [name] for evaluation and management of Lytic bone lesions. He is accompanied today by his daughter. The pt reports that he is doing well overall. He is currently a resident at [facility] in [place], [place].   The pt presented to the ED on 03/24/18 with a right distal clavicle fracture, which occurred after trying to get out of bed and pushing himself up with his right arm. He notes that his energy has been noticeably decreased for the last month. He also notes that his lower back has a slight sting when trying to stand up from sitting. He also notes some pain in his lower, right chest wall, which is exacerbated when coughing. The pt has been taking 23m Tramadol which is controlling his pain. He does endorse some weakening of both of his lower extremities in the past 1-2 months, which he attributes somewhat to his recent lower back pain.   The pt denies any bowel abnormalities recently. He notes that he stays very well hydrated, and urinates 4-5 times a night, and does have BPH, and takes Flomax. The pt denies any recent changes in his urination habits.   The pt notes that his appetite decreased for a few months, until he began assisted living. He has lost 9 pounds in the last week.   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 He now uses a walker to ambulate. He notes that the CIDP symptoms are primarily located to his legs.  He sees Dr. [name] Neurology. He took steroids without any symptomatic relief. IVIG was cost-prohibitive for the patient, but was recommended.    Regarding the patient's anemia in April 2019, he denies any obvious bleeding at that time, and denies surgeries.    The pt notes that his last colonoscopy, in 2015, was not concerning, which is corroborated by chart review.   The pt and daughter deny any concerns for memory issues, and the daughter adds his memory is "elephant-like". They are not sure why Namenda is on his medication list.   The pt denies any recent dental procedures, or concerns for dental issues. He has maintained every 6 month dental cleanings for all of his life.   Of note prior to the patient's visit today, pt has had a CT C/A/P completed on 03/31/18 with results revealing IMPRESSION: 1. Lytic lesion throughout all visualized vertebra from the lower cervical spine through the sacrum. Epidural extension of tumor T2, T3 and L2 level. Right L3-4 foraminal extension tumor. 2. Lytic lesions involving majority of ribs. Multiple pathologic rib fractures with bony expansion/sub pleural extension of tumor at several levels. 3. Lytic lesions of the scapula, hips bilaterally and pelvis bilaterally. With further progression of tumor, patient may be at risk for pathologic fractures. Pathologic fracture right clavicle incompletely assessed. 4. Circumferential narrowing sigmoid colon. Question possibility of underlying mass (series 2, image 97). Alternatively this could represent muscular hypertrophy/peristalsis. 5. Gastric  antrum circumferential narrowing. Cannot exclude mass although this may be related to peristalsis. 6. No primary lung malignancy noted. 7. Matted aortic pulmonary window lymph nodes. 8. Circumferential urinary bladder wall thickening greater anterior dome region. Right lateral bladder diverticulum. 9. 5 mm low-density lesion pancreatic body (series 5, image 10) unchanged.  This is felt unlikely to be a primary malignancy. 10. Right parapelvic 2.2 cm cyst without significant change. Scattered low-density renal lesions bila2terally too small to adequately characterize. Some were present previously. Statistically these are likely cysts. 11. Stable adrenal gland hyperplasia. 12. Gynecomastia. 13. Prostate gland causes slight impression upon the bladder base. 14.  Aortic Atherosclerosis. 15. Sequential pacemaker in place. 16. Gallstones.  Most recent lab results (03/25/18) of CBC w/diff and CMP is as follows: all values are WNL except for RBC at 4.14, HGB at 11.3, HCT at 33.6, Chloride at 94, CO2 at 33, Glucose at 100, BUN at 25, Total Protein at 9.4, Albumin at 3.4, Calcium at 13.8, GFR at 58. 03/25/18 PSA, Total was normal at 1.2  On review of systems, pt reports positional lower back pain, lower right chest wall pain, recently decreased energy levels, relatively weaker appetite, weight loss, right clavicle pain, weakening of the lower extremities, moving his bowels well, and denies changes in urination, abdominal pains, changes in breathing, changes in bowel habits, pain along the spine, leg swelling, and any other symptoms.  Interval History:   [name] returns today for management, evaluation, and C17D1 Daratumumab treatment of his Multiple Myeloma. The patient's last visit with [place] was on 07/26/19. The pt reports that he is doing well overall.  The pt reports he is good. He has gotten both doses of COVID19 vaccines. Pt has been eating well, and staying active with biking.   Lab results today (09/17/19) of CBC w/diff and CMP is as follows: all values are WNL except for Calcium at 8.5 09/17/19 of MMP is as follows: all values are WNL except for IgG Serum at 596. No M spike  09/17/19 of Vitamin D 25-Hydroxy at 42.42: WNL  On review of systems, pt reports healthy appetite and denies back pains, fatigue, abdominal pain and any other symptoms.    MEDICAL HISTORY:    Past Medical History:  Diagnosis Date  . Asthma   . BENIGN PROSTATIC HYPERTROPHY, HX OF 10/25/2008  . Bone metastases ([place]) 04/14/2018  . BRADYCARDIA 2005  . Chronic diastolic congestive heart failure ([place]) 06/05/2016  . CIDP (chronic inflammatory demyelinating polyneuropathy) ([place])   . HYPERTENSION 11/21/2006  . Iron deficiency anemia, unspecified 04/19/2013  . Multiple myeloma ([place]) 04/29/2018  . 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 PACEMAKER, PERMANENT 2005   Gen change 2014 Medtronic Adaptic L dual-chamber pacemaker, serial [id number]   . SVT (supraventricular tachycardia) ([place])   . TOBACCO ABUSE 10/25/2008   Quit 2012    SURGICAL HISTORY: Past Surgical History:  Procedure Laterality Date  . COLONOSCOPY  [id number]  . PACEMAKER INSERTION  2005  . PERMANENT PACEMAKER GENERATOR CHANGE N/A 06/19/2012   Procedure: PERMANENT PACEMAKER GENERATOR CHANGE;  Surgeon: [name], MD; Medtronic Adaptic L dual-chamber pacemaker, serial #[id number]    . SKIN GRAFT Right 1962   wrist    SOCIAL HISTORY: Social History   Socioeconomic History  . Marital status: Divorced    Spouse name: Not on file  . Number of children: 4  . Years of education: Not on file  . Highest education level: Not on file  Occupational History  .  Occupation: ENVIROMENTAL Location manager: [employer]  . Occupation: retired  Tobacco Use  . Smoking status: Former Smoker    Packs/day: 0.25    Years: 46.00    Pack years: 11.50    Types: Cigarettes    Start date: 03/16/1965    Quit date: 06/15/2010    Years since quitting: 9.2  . Smokeless tobacco: Never Used  . Tobacco comment: quit 3 years ago  Substance and Sexual Activity  . Alcohol use: No    Alcohol/week: 0.0 standard drinks  . Drug use: No  . Sexual activity: Not on file  Other Topics Concern  . Not on file  Social History Narrative   Has relocated from [place] in 2007. 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  Patient in a facility thru  [facility]   Social Determinants of Health   Financial Resource Strain:   . Difficulty of Paying Living Expenses:   Food Insecurity:   . Worried About Charity fundraiser in the Last Year:   . Arboriculturist in the Last Year:   Transportation Needs:   . Film/video editor (Medical):   Marland Kitchen Lack of Transportation (Non-Medical):   Physical Activity:   . Days of Exercise per Week:   . Minutes of Exercise per Session:   Stress:   . Feeling of Stress :   Social Connections:   . Frequency of Communication with Friends and Family:   . Frequency of Social Gatherings with Friends and Family:   . Attends Religious Services:   . Active Member of Clubs or Organizations:   . Attends Archivist Meetings:   Marland Kitchen Marital Status:   Intimate Partner Violence:   . Fear of Current or Ex-Partner:   . Emotionally Abused:   Marland Kitchen Physically Abused:   . Sexually Abused:     FAMILY HISTORY: Family History  Problem Relation Age of Onset  . Heart attack Father        Died, 67  . Kidney disease Father   . Parkinson's disease Mother        Died, 58  . Breast cancer Sister        Living, 51  . Diabetes type II Brother        Living, 43  . Breast cancer Sister        Living, 60  . Diabetes Daughter        Living, 50  . Diabetes Son        Living, 72  . Ovarian cancer Daughter   . Colon cancer Neg Hx   . Rectal cancer Neg Hx   . Stomach cancer Neg Hx     ALLERGIES:  is allergic to lexapro [escitalopram oxalate].  MEDICATIONS:  Current Outpatient Medications  Medication Sig Dispense Refill  . acyclovir (ZOVIRAX) 400 MG tablet Take 1 tablet (400 mg total) by mouth 2 (two) times daily. 60 tablet 11  . aspirin EC 81 MG tablet Take 81 mg by mouth daily.    Marland Kitchen dexamethasone (DECADRON) 4 MG tablet 45m (5 tabs) with breakfast the day after each Daratumumab infusion 20 tablet 4  . diltiazem (TIAZAC) 240 MG 24 hr capsule Take 1 capsule by mouth daily.    . flecainide (TAMBOCOR) 50 MG tablet  Take 75 mg by mouth daily as needed.    . furosemide (LASIX) 40 MG tablet Take 40 mg by mouth daily.    . irbesartan (AVAPRO) 300 MG tablet Take 300 mg by  mouth daily.    . memantine (NAMENDA) 5 MG tablet Take 5 mg by mouth 2 (two) times daily.    . metoprolol tartrate (LOPRESSOR) 50 MG tablet Take 1 tablet (50 mg total) by mouth 2 (two) times daily. 60 tablet 0  . ondansetron (ZOFRAN) 8 MG tablet Take 1 tablet (8 mg total) by mouth 2 (two) times daily as needed (Nausea or vomiting). 30 tablet 1  . polyethylene glycol (MIRALAX / GLYCOLAX) packet Take 17 g by mouth daily. 14 each 0  . prochlorperazine (COMPAZINE) 10 MG tablet Take 1 tablet (10 mg total) by mouth every 6 (six) hours as needed (Nausea or vomiting). 30 tablet 1  . Saccharomyces boulardii (FLORASTOR PO) Take 250 mg by mouth 2 (two) times daily.    . tamsulosin (FLOMAX) 0.4 MG CAPS capsule Take 2 capsules (0.8 mg total) by mouth daily. 90 capsule 0   No current facility-administered medications for this visit.   Facility-Administered Medications Ordered in Other Visits  Medication Dose Route Frequency Provider Last Rate Last Admin  . sodium chloride flush (NS) 0.9 % injection 10 mL  10 mL Intracatheter PRN Brunetta Genera, MD        REVIEW OF SYSTEMS:   A 10+ POINT REVIEW OF SYSTEMS WAS OBTAINED including neurology, dermatology, psychiatry, cardiac, respiratory, lymph, extremities, GI, GU, Musculoskeletal, constitutional, breasts, reproductive, HEENT.  All pertinent positives are noted in the HPI.  All others are negative.   PHYSICAL EXAMINATION: ECOG PERFORMANCE STATUS: 2 - Symptomatic, <50% confined to bed  .BP (!) 169/92 (BP Location: Left Arm, Patient Position: Sitting)   Pulse 60   Temp 98.7 F (37.1 C) (Temporal)   Resp 17   Ht '5\' 11"'  (1.803 m)   Wt 213 lb 6.4 oz (96.8 kg)   SpO2 100%   BMI 29.76 kg/m   GENERAL:alert, in no acute distress and comfortable SKIN: no acute rashes, no significant lesions EYES:  conjunctiva are pink and non-injected, sclera anicteric OROPHARYNX: MMM, no exudates, no oropharyngeal erythema or ulceration NECK: supple, no JVD LYMPH:  no palpable lymphadenopathy in the cervical, axillary or inguinal regions LUNGS: clear to auscultation b/l with normal respiratory effort HEART: regular rate & rhythm ABDOMEN:  normoactive bowel sounds , non tender, not distended. Extremity: no pedal edema PSYCH: alert & oriented x 3 with fluent speech NEURO: no focal motor/sensory deficits  LABORATORY DATA:  I have reviewed the data as listed  . CBC Latest Ref Rng & Units 09/17/2019 07/26/2019 07/16/2019  WBC 4.0 - 10.5 K/uL 6.3 6.8 4.7  Hemoglobin 13.0 - 17.0 g/dL 13.0 12.7(L) 12.5(L)  Hematocrit 39.0 - 52.0 % 41.5 39.6 38.9(L)  Platelets 150 - 400 K/uL 153 157 153    . CMP Latest Ref Rng & Units 09/17/2019 07/26/2019 07/16/2019  Glucose 70 - 99 mg/dL 87 96 103(H)  BUN 8 - 23 mg/dL '15 13 19  ' Creatinine 0.61 - 1.24 mg/dL 1.11 1.10 1.06  Sodium 135 - 145 mmol/L 144 141 140  Potassium 3.5 - 5.1 mmol/L 3.6 4.1 3.7  Chloride 98 - 111 mmol/L 105 106 106  CO2 22 - 32 mmol/L '28 25 26  ' Calcium 8.9 - 10.3 mg/dL 8.5(L) 8.4(L) 8.8(L)  Total Protein 6.5 - 8.1 g/dL 6.7 6.5 -  Total Bilirubin 0.3 - 1.2 mg/dL 0.9 0.7 -  Alkaline Phos 38 - 126 U/L 67 66 -  AST 15 - 41 U/L 16 16 -  ALT 0 - 44 U/L 10 9 -  12/14/2018 MMP    12/14/2018 MMP    04/17/18 BM Bx:    RADIOGRAPHIC STUDIES: I have personally reviewed the radiological images as listed and agreed with the findings in the report. No results found.  ASSESSMENT & PLAN:  84 y.o. male with  1. Lytic bone lesions - w/u consistent with newly diagnosed multiple myeloma Labs upon initial presentation from 04/14/18; hypercalcemic with Calcium at 13.8, renal function up form baseline with Creatinine at 1.48, HGB at 11.3. PSA was normal.   03/31/18 CT C/A/P revealed 1. Lytic lesion throughout all visualized vertebra from the lower cervical  spine through the sacrum. Epidural extension of tumor T2, T3 and L2 level. Right L3-4 foraminal extension tumor. 2. Lytic lesions involving majority of ribs. Multiple pathologic rib fractures with bony expansion/sub pleural extension of tumor at several levels. 3. Lytic lesions of the scapula, hips bilaterally and pelvis bilaterally. With further progression of tumor, patient may be at risk for pathologic fractures. Pathologic fracture right clavicle incompletely assessed. 4. Circumferential narrowing sigmoid colon. Question possibility of underlying mass (series 2, image 97). Alternatively this could represent muscular hypertrophy/peristalsis. 5. Gastric antrum circumferential narrowing. Cannot exclude mass although this may be related to peristalsis. 6. No primary lung malignancy noted. 7. Matted aortic pulmonary window lymph nodes. 8. Circumferential urinary bladder wall thickening greater anterior dome region. Right lateral bladder diverticulum. 9. 5 mm low-density lesion pancreatic body (series 5, image 10) unchanged. This is felt unlikely to be a primary malignancy. 10. Right parapelvic 2.2 cm cyst without significant change. Scattered low-density renal lesions bilaterally too small to adequately characterize. Some were present previously. Statistically these are likely cysts. 11. Stable adrenal gland hyperplasia. 12. Gynecomastia. 13. Prostate gland causes slight impression upon the bladder base. 14.  Aortic Atherosclerosis. 15. Sequential pacemaker in place. 16. Gallstones.   04/23/18 PET/CT revealed Innumerable hypermetabolic lytic lesions throughout the skeleton especially involving the spine, ribs, bony pelvis along with some involvement of both proximal femurs. The appearance is compatible with pathologic diagnosis of plasma cell neoplasm could well reflect multiple myeloma. Resulting pathologic fractures of the right lateral clavicle and of multiple bilateral ribs. Lesions are present along the  cortical margins of the spinal canal. 2. Both the area and the stomach in the area in the sigmoid colon drawn attention to on prior CT scan appear benign normal today. 3. The confluence of AP window lymph nodes measures about 1.2 cm in short axis with maximum SUV 3.1, which is mildly above the blood pool merit surveillance. 4. Other imaging findings of potential clinical significance: Aortic Atherosclerosis. Coronary atherosclerosis. Pacemaker noted. Borderline prostatomegaly. Cholelithiasis.  04/21/18 BM Bx revealed Normocellular bone marrow with Plasma Cell neoplasm. Scattered medium sized clusters of kappa-restricted plasma cells (14% aspirate, 10% CD138 immunohistochemistry.   I do suspect that the burden of disease is underestimated in the bone marrow sample given PET/CT findings of innumerable lytic lesions and an M spike of 3.4g   06/08/18 DG Hips bilat which revealed Innumerable lucencies are noted throughout the pelvis and proximal femurs, similar findings noted on prior PET-CT of 04/23/2018. Findings are consistent patient's known multiple myeloma. Femoral necks are intact. 2.  Aortoiliac atherosclerotic vascular disease.  2. Pathologic right clavicle fracture due to myeloma- resolved 3. Hypercalcemia- resolved.  PLAN:    -Discussed pt labwork today, 09/17/19; of CBC w/diff and CMP is as follows: all values are WNL except for Calcium at 8.5 -Discussed 09/17/19 of MMP is as follows: all values are WNL except  for IgG Serum at 596 -Discussed 09/17/19 of Vitamin D 25-Hydroxy at 42.42: WNL --Pt continues to be in remission from Multiple Myeloma, based on last MMP - patient is off Daratumumab and continues to be monitored for myeloma progression. -Continue Xgeva every 8 weeks -Recommends taking OTC Vitamin D  -Will see back in 4 months with labs   FOLLOW UP: Plz schedule Xgeva every 2 months with labs- plz schedule next 4 doses RTC with Dr Irene Limbo in 4 months  The total time spent in the appt  was 20 minutes and more than 50% was on counseling and direct patient cares.  All of the patient's questions were answered with apparent satisfaction. The patient knows to call the clinic with any problems, questions or concerns.   Sullivan Lone MD [place] AAHIVMS [facility] Hematology/Oncology Physician [facility]  (Office):       [phone] (Work cell):  [phone] (Fax):           [phone]  09/24/2019 11:06 AM  I, Dawayne Cirri am acting as a Education administrator for Dr. Sullivan Lone.   .I have reviewed the above documentation for accuracy and completeness, and I agree with the above. Brunetta Genera MD

## 2019-09-24 ENCOUNTER — Inpatient Hospital Stay: Payer: Medicare Other

## 2019-09-24 ENCOUNTER — Other Ambulatory Visit: Payer: Medicare Other

## 2019-09-24 ENCOUNTER — Inpatient Hospital Stay (HOSPITAL_BASED_OUTPATIENT_CLINIC_OR_DEPARTMENT_OTHER): Payer: Medicare Other | Admitting: Hematology

## 2019-09-24 ENCOUNTER — Other Ambulatory Visit: Payer: Self-pay

## 2019-09-24 VITALS — BP 169/92 | HR 60 | Temp 98.7°F | Resp 17 | Ht 71.0 in | Wt 213.4 lb

## 2019-09-24 DIAGNOSIS — C7951 Secondary malignant neoplasm of bone: Secondary | ICD-10-CM

## 2019-09-24 DIAGNOSIS — Z7189 Other specified counseling: Secondary | ICD-10-CM

## 2019-09-24 DIAGNOSIS — C9001 Multiple myeloma in remission: Secondary | ICD-10-CM | POA: Diagnosis not present

## 2019-09-24 DIAGNOSIS — C9000 Multiple myeloma not having achieved remission: Secondary | ICD-10-CM

## 2019-09-24 MED ORDER — DENOSUMAB 120 MG/1.7ML ~~LOC~~ SOLN
120.0000 mg | Freq: Once | SUBCUTANEOUS | Status: AC
  Administered 2019-09-24: 120 mg via SUBCUTANEOUS

## 2019-09-24 MED ORDER — DENOSUMAB 120 MG/1.7ML ~~LOC~~ SOLN
SUBCUTANEOUS | Status: AC
  Filled 2019-09-24: qty 1.7

## 2019-09-24 NOTE — Patient Instructions (Signed)
Thank you for choosing [facility] to provide your oncology and hematology care.   Should you have questions after your visit to the [facility] ([facility]), please contact this office at [phone] between 8:30 AM and 4:30 PM.  Voice mails left after 4:00 PM may not be returned until the following business day.  Calls received after 4:30 PM will be answered by an off-site Nurse Triage Line.    Prescription Refills:  Please have your pharmacy contact us directly for most prescription requests.  Contact the office directly for refills of narcotics (pain medications). Allow 48-72 hours for refills.  Appointments: Please contact the [facility] scheduling department [phone] for questions regarding [facility] appointment scheduling.  Contact the schedulers with any scheduling changes so that your appointment can be rescheduled in a timely manner.   Central Scheduling for [facility] [phone] - Call to schedule procedures such as PET scans, CT scans, MRI, Ultrasound, etc.  To afford each patient quality time with our providers, please arrive 30 minutes before your scheduled appointment time.  If you arrive late for your appointment, you may be asked to reschedule.  We strive to give you quality time with our providers, and arriving late affects you and other patients whose appointments are after yours. If you are a no show for multiple scheduled visits, you may be dismissed from the clinic at the providers discretion.     Resources: [facility] Social Workers [phone] for additional information on assistance programs or assistance connecting with community support programs   [place] DSS  336-641-3447: Information regarding food stamps, Medicaid, and utility assistance [place] 336-333-6589   [place] [employer]'s shared-ride transportation service for eligible riders who have a disability that prevents them from riding the fixed route bus.   Medicare Rights Center  800-333-4114 Helps people with Medicare understand their rights and benefits, navigate the Medicare system, and secure the quality healthcare they deserve [employer] 800-227-2345 Assists patients locate various types of support and financial assistance Cancer Care: 1-800-813-HOPE (4673) Provides financial assistance, online support groups, medication/co-pay assistance.   Transportation Assistance for appointments at [facility]: Transportation Coordinator [phone]  Again, thank you for choosing [facility] for your care.       

## 2019-09-24 NOTE — Patient Instructions (Signed)
Denosumab injection What is this medicine? DENOSUMAB (den oh sue mab) slows bone breakdown. Prolia is used to treat osteoporosis in women after menopause and in men, and in people who are taking corticosteroids for 6 months or more. Xgeva is used to treat a high calcium level due to cancer and to prevent bone fractures and other bone problems caused by multiple myeloma or cancer bone metastases. Xgeva is also used to treat giant cell tumor of the bone. This medicine may be used for other purposes; ask your health care provider or pharmacist if you have questions. COMMON BRAND NAME(S): Prolia, XGEVA What should I tell my health care provider before I take this medicine? They need to know if you have any of these conditions:  dental disease  having surgery or tooth extraction  infection  kidney disease  low levels of calcium or Vitamin D in the blood  malnutrition  on hemodialysis  skin conditions or sensitivity  thyroid or parathyroid disease  an unusual reaction to denosumab, other medicines, foods, dyes, or preservatives  pregnant or trying to get pregnant  breast-feeding How should I use this medicine? This medicine is for injection under the skin. It is given by a health care professional in a hospital or clinic setting. A special MedGuide will be given to you before each treatment. Be sure to read this information carefully each time. For Prolia, talk to your pediatrician regarding the use of this medicine in children. Special care may be needed. For Xgeva, talk to your pediatrician regarding the use of this medicine in children. While this drug may be prescribed for children as young as 13 years for selected conditions, precautions do apply. Overdosage: If you think you have taken too much of this medicine contact a poison control center or emergency room at once. NOTE: This medicine is only for you. Do not share this medicine with others. What if I miss a dose? It is  important not to miss your dose. Call your doctor or health care professional if you are unable to keep an appointment. What may interact with this medicine? Do not take this medicine with any of the following medications:  other medicines containing denosumab This medicine may also interact with the following medications:  medicines that lower your chance of fighting infection  steroid medicines like prednisone or cortisone This list may not describe all possible interactions. Give your health care provider a list of all the medicines, herbs, non-prescription drugs, or dietary supplements you use. Also tell them if you smoke, drink alcohol, or use illegal drugs. Some items may interact with your medicine. What should I watch for while using this medicine? Visit your doctor or health care professional for regular checks on your progress. Your doctor or health care professional may order blood tests and other tests to see how you are doing. Call your doctor or health care professional for advice if you get a fever, chills or sore throat, or other symptoms of a cold or flu. Do not treat yourself. This drug may decrease your body's ability to fight infection. Try to avoid being around people who are sick. You should make sure you get enough calcium and vitamin D while you are taking this medicine, unless your doctor tells you not to. Discuss the foods you eat and the vitamins you take with your health care professional. See your dentist regularly. Brush and floss your teeth as directed. Before you have any dental work done, tell your dentist you are   receiving this medicine. Do not become pregnant while taking this medicine or for 5 months after stopping it. Talk with your doctor or health care professional about your birth control options while taking this medicine. Women should inform their doctor if they wish to become pregnant or think they might be pregnant. There is a potential for serious side  effects to an unborn child. Talk to your health care professional or pharmacist for more information. What side effects may I notice from receiving this medicine? Side effects that you should report to your doctor or health care professional as soon as possible:  allergic reactions like skin rash, itching or hives, swelling of the face, lips, or tongue  bone pain  breathing problems  dizziness  jaw pain, especially after dental work  redness, blistering, peeling of the skin  signs and symptoms of infection like fever or chills; cough; sore throat; pain or trouble passing urine  signs of low calcium like fast heartbeat, muscle cramps or muscle pain; pain, tingling, numbness in the hands or feet; seizures  unusual bleeding or bruising  unusually weak or tired Side effects that usually do not require medical attention (report to your doctor or health care professional if they continue or are bothersome):  constipation  diarrhea  headache  joint pain  loss of appetite  muscle pain  runny nose  tiredness  upset stomach This list may not describe all possible side effects. Call your doctor for medical advice about side effects. You may report side effects to FDA at 1-800-FDA-1088. Where should I keep my medicine? This medicine is only given in a clinic, doctor's office, or other health care setting and will not be stored at home. NOTE: This sheet is a summary. It may not cover all possible information. If you have questions about this medicine, talk to your doctor, pharmacist, or health care provider.  2020 [employer] (2017-09-05 16:10:44)

## 2019-09-29 ENCOUNTER — Telehealth: Payer: Self-pay | Admitting: Hematology

## 2019-09-29 NOTE — Telephone Encounter (Signed)
Scheduled per 05/14 los, patient has been called and voicemail was left.

## 2019-09-29 NOTE — Telephone Encounter (Signed)
Spoke with daughter, states the current monitor sent the patient is unable to use d/t not having cell phone. Called [employer] tech support and they are sending new monitor to patient for bedside. Should arrive within 7-10 days. Advised [name] once she receives the new monitor to place the old monitor back in the box, there will be a return label, attach that to outside of box then return to [employer] by [employer]. Verbalizes understanding.   Address verified with attn to [facility].   [address]  [place], [place] [address]

## 2019-10-06 ENCOUNTER — Ambulatory Visit (INDEPENDENT_AMBULATORY_CARE_PROVIDER_SITE_OTHER): Payer: Medicare Other | Admitting: *Deleted

## 2019-10-06 DIAGNOSIS — R001 Bradycardia, unspecified: Secondary | ICD-10-CM

## 2019-10-06 LAB — CUP PACEART REMOTE DEVICE CHECK
Battery Impedance: 802 Ohm
Battery Remaining Longevity: 72 mo
Battery Voltage: 2.75 V
Brady Statistic AP VP Percent: 25 %
Brady Statistic AP VS Percent: 0 %
Brady Statistic AS VP Percent: 38 %
Brady Statistic AS VS Percent: 36 %
Date Time Interrogation Session: 20210526091124
Implantable Lead Implant Date: 20050121
Implantable Lead Implant Date: 20050121
Implantable Lead Location: 753859
Implantable Lead Location: 753860
Implantable Lead Model: 5076
Implantable Lead Model: 5076
Implantable Pulse Generator Implant Date: 20140207
Lead Channel Impedance Value: 526 Ohm
Lead Channel Impedance Value: 665 Ohm
Lead Channel Pacing Threshold Amplitude: 0.5 V
Lead Channel Pacing Threshold Amplitude: 1.25 V
Lead Channel Pacing Threshold Pulse Width: 0.4 ms
Lead Channel Pacing Threshold Pulse Width: 0.4 ms
Lead Channel Setting Pacing Amplitude: 2.5 V
Lead Channel Setting Pacing Amplitude: 2.5 V
Lead Channel Setting Pacing Pulse Width: 0.4 ms
Lead Channel Setting Sensing Sensitivity: 2 mV

## 2019-10-07 NOTE — Progress Notes (Signed)
Remote pacemaker transmission.   

## 2019-10-14 NOTE — Telephone Encounter (Signed)
Patient monitor is now connected to [place]. Last send 10/06/2019.

## 2019-10-19 ENCOUNTER — Ambulatory Visit: Payer: Medicare Other | Admitting: Hematology

## 2019-10-19 ENCOUNTER — Ambulatory Visit: Payer: Medicare Other

## 2019-10-19 ENCOUNTER — Other Ambulatory Visit: Payer: Medicare Other

## 2019-10-26 DIAGNOSIS — B351 Tinea unguium: Secondary | ICD-10-CM | POA: Diagnosis not present

## 2019-10-26 DIAGNOSIS — M79675 Pain in left toe(s): Secondary | ICD-10-CM | POA: Diagnosis not present

## 2019-10-26 DIAGNOSIS — M79674 Pain in right toe(s): Secondary | ICD-10-CM | POA: Diagnosis not present

## 2019-11-26 ENCOUNTER — Inpatient Hospital Stay: Payer: Medicare Other

## 2019-11-26 ENCOUNTER — Other Ambulatory Visit: Payer: Self-pay

## 2019-11-26 ENCOUNTER — Inpatient Hospital Stay: Payer: Medicare Other | Attending: Hematology

## 2019-11-26 ENCOUNTER — Ambulatory Visit: Payer: Medicare Other

## 2019-11-26 ENCOUNTER — Other Ambulatory Visit: Payer: Medicare Other

## 2019-11-26 VITALS — BP 146/72 | HR 63 | Resp 17

## 2019-11-26 DIAGNOSIS — C7951 Secondary malignant neoplasm of bone: Secondary | ICD-10-CM

## 2019-11-26 DIAGNOSIS — Z7189 Other specified counseling: Secondary | ICD-10-CM

## 2019-11-26 DIAGNOSIS — C9001 Multiple myeloma in remission: Secondary | ICD-10-CM | POA: Diagnosis not present

## 2019-11-26 DIAGNOSIS — C9000 Multiple myeloma not having achieved remission: Secondary | ICD-10-CM

## 2019-11-26 DIAGNOSIS — Z5112 Encounter for antineoplastic immunotherapy: Secondary | ICD-10-CM

## 2019-11-26 LAB — CMP (CANCER CENTER ONLY)
ALT: 8 U/L (ref 0–44)
AST: 15 U/L (ref 15–41)
Albumin: 3.7 g/dL (ref 3.5–5.0)
Alkaline Phosphatase: 81 U/L (ref 38–126)
Anion gap: 11 (ref 5–15)
BUN: 11 mg/dL (ref 8–23)
CO2: 27 mmol/L (ref 22–32)
Calcium: 9.6 mg/dL (ref 8.9–10.3)
Chloride: 103 mmol/L (ref 98–111)
Creatinine: 1.02 mg/dL (ref 0.61–1.24)
GFR, Est AFR Am: >60 mL/min (ref >60)
GFR, Estimated: >60 mL/min (ref >60)
Glucose, Bld: 95 mg/dL (ref 70–99)
Potassium: 4.2 mmol/L (ref 3.5–5.1)
Sodium: 141 mmol/L (ref 135–145)
Total Bilirubin: 0.9 mg/dL (ref 0.3–1.2)
Total Protein: 7.1 g/dL (ref 6.5–8.1)

## 2019-11-26 LAB — CBC WITH DIFFERENTIAL/PLATELET
Abs Immature Granulocytes: 0.01 10*3/uL (ref 0.00–0.07)
Basophils Absolute: 0.1 10*3/uL (ref 0.0–0.1)
Basophils Relative: 1 %
Eosinophils Absolute: 0.4 10*3/uL (ref 0.0–0.5)
Eosinophils Relative: 7 %
HCT: 39.9 % (ref 39.0–52.0)
Hemoglobin: 12.8 g/dL — ABNORMAL LOW (ref 13.0–17.0)
Immature Granulocytes: 0 %
Lymphocytes Relative: 43 %
Lymphs Abs: 2.8 10*3/uL (ref 0.7–4.0)
MCH: 27.4 pg (ref 26.0–34.0)
MCHC: 32.1 g/dL (ref 30.0–36.0)
MCV: 85.4 fL (ref 80.0–100.0)
Monocytes Absolute: 0.6 10*3/uL (ref 0.1–1.0)
Monocytes Relative: 9 %
Neutro Abs: 2.6 10*3/uL (ref 1.7–7.7)
Neutrophils Relative %: 40 %
Platelets: 176 10*3/uL (ref 150–400)
RBC: 4.67 MIL/uL (ref 4.22–5.81)
RDW: 14.4 % (ref 11.5–15.5)
WBC: 6.5 10*3/uL (ref 4.0–10.5)
nRBC: 0 % (ref 0.0–0.2)

## 2019-11-26 MED ORDER — DENOSUMAB 120 MG/1.7ML ~~LOC~~ SOLN
120.0000 mg | Freq: Once | SUBCUTANEOUS | Status: AC
  Administered 2019-11-26: 120 mg via SUBCUTANEOUS

## 2019-11-26 NOTE — Patient Instructions (Signed)
Denosumab injection What is this medicine? DENOSUMAB (den oh sue mab) slows bone breakdown. Prolia is used to treat osteoporosis in women after menopause and in men, and in people who are taking corticosteroids for 6 months or more. Xgeva is used to treat a high calcium level due to cancer and to prevent bone fractures and other bone problems caused by multiple myeloma or cancer bone metastases. Xgeva is also used to treat giant cell tumor of the bone. This medicine may be used for other purposes; ask your health care provider or pharmacist if you have questions. COMMON BRAND NAME(S): Prolia, XGEVA What should I tell my health care provider before I take this medicine? They need to know if you have any of these conditions:  dental disease  having surgery or tooth extraction  infection  kidney disease  low levels of calcium or Vitamin D in the blood  malnutrition  on hemodialysis  skin conditions or sensitivity  thyroid or parathyroid disease  an unusual reaction to denosumab, other medicines, foods, dyes, or preservatives  pregnant or trying to get pregnant  breast-feeding How should I use this medicine? This medicine is for injection under the skin. It is given by a health care professional in a hospital or clinic setting. A special MedGuide will be given to you before each treatment. Be sure to read this information carefully each time. For Prolia, talk to your pediatrician regarding the use of this medicine in children. Special care may be needed. For Xgeva, talk to your pediatrician regarding the use of this medicine in children. While this drug may be prescribed for children as young as 13 years for selected conditions, precautions do apply. Overdosage: If you think you have taken too much of this medicine contact a poison control center or emergency room at once. NOTE: This medicine is only for you. Do not share this medicine with others. What if I miss a dose? It is  important not to miss your dose. Call your doctor or health care professional if you are unable to keep an appointment. What may interact with this medicine? Do not take this medicine with any of the following medications:  other medicines containing denosumab This medicine may also interact with the following medications:  medicines that lower your chance of fighting infection  steroid medicines like prednisone or cortisone This list may not describe all possible interactions. Give your health care provider a list of all the medicines, herbs, non-prescription drugs, or dietary supplements you use. Also tell them if you smoke, drink alcohol, or use illegal drugs. Some items may interact with your medicine. What should I watch for while using this medicine? Visit your doctor or health care professional for regular checks on your progress. Your doctor or health care professional may order blood tests and other tests to see how you are doing. Call your doctor or health care professional for advice if you get a fever, chills or sore throat, or other symptoms of a cold or flu. Do not treat yourself. This drug may decrease your body's ability to fight infection. Try to avoid being around people who are sick. You should make sure you get enough calcium and vitamin D while you are taking this medicine, unless your doctor tells you not to. Discuss the foods you eat and the vitamins you take with your health care professional. See your dentist regularly. Brush and floss your teeth as directed. Before you have any dental work done, tell your dentist you are   receiving this medicine. Do not become pregnant while taking this medicine or for 5 months after stopping it. Talk with your doctor or health care professional about your birth control options while taking this medicine. Women should inform their doctor if they wish to become pregnant or think they might be pregnant. There is a potential for serious side  effects to an unborn child. Talk to your health care professional or pharmacist for more information. What side effects may I notice from receiving this medicine? Side effects that you should report to your doctor or health care professional as soon as possible:  allergic reactions like skin rash, itching or hives, swelling of the face, lips, or tongue  bone pain  breathing problems  dizziness  jaw pain, especially after dental work  redness, blistering, peeling of the skin  signs and symptoms of infection like fever or chills; cough; sore throat; pain or trouble passing urine  signs of low calcium like fast heartbeat, muscle cramps or muscle pain; pain, tingling, numbness in the hands or feet; seizures  unusual bleeding or bruising  unusually weak or tired Side effects that usually do not require medical attention (report to your doctor or health care professional if they continue or are bothersome):  constipation  diarrhea  headache  joint pain  loss of appetite  muscle pain  runny nose  tiredness  upset stomach This list may not describe all possible side effects. Call your doctor for medical advice about side effects. You may report side effects to FDA at 1-800-FDA-1088. Where should I keep my medicine? This medicine is only given in a clinic, doctor's office, or other health care setting and will not be stored at home. NOTE: This sheet is a summary. It may not cover all possible information. If you have questions about this medicine, talk to your doctor, pharmacist, or health care provider.  2020 [employer] (2017-09-05 16:10:44)

## 2019-11-30 LAB — MULTIPLE MYELOMA PANEL, SERUM
Albumin SerPl Elph-Mcnc: 3.5 g/dL (ref 2.9–4.4)
Albumin/Glob SerPl: 1.2 (ref 0.7–1.7)
Alpha 1: 0.3 g/dL (ref 0.0–0.4)
Alpha2 Glob SerPl Elph-Mcnc: 1 g/dL (ref 0.4–1.0)
B-Globulin SerPl Elph-Mcnc: 1.2 g/dL (ref 0.7–1.3)
Gamma Glob SerPl Elph-Mcnc: 0.6 g/dL (ref 0.4–1.8)
Globulin, Total: 3 g/dL (ref 2.2–3.9)
IgA: 247 mg/dL (ref 61–437)
IgG (Immunoglobin G), Serum: 681 mg/dL (ref 603–1613)
IgM (Immunoglobulin M), Srm: 50 mg/dL (ref 15–143)
Total Protein ELP: 6.5 g/dL (ref 6.0–8.5)

## 2019-12-22 ENCOUNTER — Telehealth: Payer: Self-pay | Admitting: Neurology

## 2019-12-22 NOTE — Telephone Encounter (Signed)
Spoke with daughter and told her that per the DMV form, pt shouldn't need a medical recertification for placard renewal since he is over 59. Told her that if she still wants Dr Posey Pronto to sign then she can drop form off for signature and we'll notify when ready to pick up so she can take to [facility] [place]. Sending renewal form to her since she thinks she misplaced the form. She verbalized understanding of all inst.

## 2019-12-22 NOTE — Telephone Encounter (Signed)
AccessNurse 12/21/19 @ 4:37pm:  "Caller states she completed the DMV handicap information for her father and needs to drop paperwork off. States that her dad needs to re-file and needs to know what she needs to do."

## 2020-01-05 ENCOUNTER — Ambulatory Visit (INDEPENDENT_AMBULATORY_CARE_PROVIDER_SITE_OTHER): Payer: Medicare Other | Admitting: *Deleted

## 2020-01-05 DIAGNOSIS — R001 Bradycardia, unspecified: Secondary | ICD-10-CM

## 2020-01-06 LAB — CUP PACEART REMOTE DEVICE CHECK
Battery Impedance: 905 Ohm
Battery Remaining Longevity: 67 mo
Battery Voltage: 2.76 V
Brady Statistic AP VP Percent: 23 %
Brady Statistic AP VS Percent: 0 %
Brady Statistic AS VP Percent: 58 %
Brady Statistic AS VS Percent: 19 %
Date Time Interrogation Session: 20210825112820
Implantable Lead Implant Date: 20050121
Implantable Lead Implant Date: 20050121
Implantable Lead Location: 753859
Implantable Lead Location: 753860
Implantable Lead Model: 5076
Implantable Lead Model: 5076
Implantable Pulse Generator Implant Date: 20140207
Lead Channel Impedance Value: 559 Ohm
Lead Channel Impedance Value: 650 Ohm
Lead Channel Pacing Threshold Amplitude: 0.5 V
Lead Channel Pacing Threshold Amplitude: 1.25 V
Lead Channel Pacing Threshold Pulse Width: 0.4 ms
Lead Channel Pacing Threshold Pulse Width: 0.4 ms
Lead Channel Setting Pacing Amplitude: 2.5 V
Lead Channel Setting Pacing Amplitude: 2.5 V
Lead Channel Setting Pacing Pulse Width: 0.4 ms
Lead Channel Setting Sensing Sensitivity: 2 mV

## 2020-01-11 DIAGNOSIS — M79675 Pain in left toe(s): Secondary | ICD-10-CM | POA: Diagnosis not present

## 2020-01-11 DIAGNOSIS — M79674 Pain in right toe(s): Secondary | ICD-10-CM | POA: Diagnosis not present

## 2020-01-11 DIAGNOSIS — B351 Tinea unguium: Secondary | ICD-10-CM | POA: Diagnosis not present

## 2020-01-11 NOTE — Progress Notes (Signed)
Remote pacemaker transmission.   

## 2020-01-27 ENCOUNTER — Ambulatory Visit: Payer: Medicare Other

## 2020-01-27 ENCOUNTER — Other Ambulatory Visit: Payer: Medicare Other

## 2020-01-28 ENCOUNTER — Inpatient Hospital Stay: Payer: Medicare Other

## 2020-01-28 ENCOUNTER — Inpatient Hospital Stay (HOSPITAL_BASED_OUTPATIENT_CLINIC_OR_DEPARTMENT_OTHER): Payer: Medicare Other | Admitting: Hematology

## 2020-01-28 ENCOUNTER — Inpatient Hospital Stay: Payer: Medicare Other | Attending: Hematology

## 2020-01-28 ENCOUNTER — Other Ambulatory Visit: Payer: Self-pay

## 2020-01-28 ENCOUNTER — Telehealth: Payer: Self-pay | Admitting: Hematology

## 2020-01-28 VITALS — BP 174/86 | HR 62 | Temp 97.8°F | Resp 18 | Ht 71.0 in | Wt 207.1 lb

## 2020-01-28 DIAGNOSIS — C9000 Multiple myeloma not having achieved remission: Secondary | ICD-10-CM | POA: Insufficient documentation

## 2020-01-28 DIAGNOSIS — Z5112 Encounter for antineoplastic immunotherapy: Secondary | ICD-10-CM

## 2020-01-28 DIAGNOSIS — C9001 Multiple myeloma in remission: Secondary | ICD-10-CM | POA: Diagnosis present

## 2020-01-28 DIAGNOSIS — I5032 Chronic diastolic (congestive) heart failure: Secondary | ICD-10-CM | POA: Diagnosis not present

## 2020-01-28 DIAGNOSIS — Z833 Family history of diabetes mellitus: Secondary | ICD-10-CM | POA: Insufficient documentation

## 2020-01-28 DIAGNOSIS — I110 Hypertensive heart disease with heart failure: Secondary | ICD-10-CM | POA: Insufficient documentation

## 2020-01-28 DIAGNOSIS — Z87891 Personal history of nicotine dependence: Secondary | ICD-10-CM | POA: Diagnosis not present

## 2020-01-28 DIAGNOSIS — Z1152 Encounter for screening for COVID-19: Secondary | ICD-10-CM | POA: Diagnosis not present

## 2020-01-28 DIAGNOSIS — Z7982 Long term (current) use of aspirin: Secondary | ICD-10-CM | POA: Diagnosis not present

## 2020-01-28 DIAGNOSIS — Z803 Family history of malignant neoplasm of breast: Secondary | ICD-10-CM | POA: Insufficient documentation

## 2020-01-28 DIAGNOSIS — Z79899 Other long term (current) drug therapy: Secondary | ICD-10-CM | POA: Diagnosis not present

## 2020-01-28 DIAGNOSIS — Z8249 Family history of ischemic heart disease and other diseases of the circulatory system: Secondary | ICD-10-CM | POA: Insufficient documentation

## 2020-01-28 DIAGNOSIS — Z8041 Family history of malignant neoplasm of ovary: Secondary | ICD-10-CM | POA: Diagnosis not present

## 2020-01-28 DIAGNOSIS — N400 Enlarged prostate without lower urinary tract symptoms: Secondary | ICD-10-CM | POA: Insufficient documentation

## 2020-01-28 LAB — CMP (CANCER CENTER ONLY)
ALT: 10 U/L (ref 0–44)
AST: 17 U/L (ref 15–41)
Albumin: 3.8 g/dL (ref 3.5–5.0)
Alkaline Phosphatase: 68 U/L (ref 38–126)
Anion gap: 11 (ref 5–15)
BUN: 19 mg/dL (ref 8–23)
CO2: 26 mmol/L (ref 22–32)
Calcium: 9.4 mg/dL (ref 8.9–10.3)
Chloride: 103 mmol/L (ref 98–111)
Creatinine: 1.2 mg/dL (ref 0.61–1.24)
GFR, Est AFR Am: >60 mL/min (ref >60)
GFR, Estimated: 56 mL/min — ABNORMAL LOW (ref >60)
Glucose, Bld: 98 mg/dL (ref 70–99)
Potassium: 3.7 mmol/L (ref 3.5–5.1)
Sodium: 140 mmol/L (ref 135–145)
Total Bilirubin: 0.6 mg/dL (ref 0.3–1.2)
Total Protein: 7.2 g/dL (ref 6.5–8.1)

## 2020-01-28 LAB — CBC WITH DIFFERENTIAL/PLATELET
Abs Immature Granulocytes: 0.01 10*3/uL (ref 0.00–0.07)
Basophils Absolute: 0.1 10*3/uL (ref 0.0–0.1)
Basophils Relative: 1 %
Eosinophils Absolute: 0.2 10*3/uL (ref 0.0–0.5)
Eosinophils Relative: 3 %
HCT: 39.9 % (ref 39.0–52.0)
Hemoglobin: 12.8 g/dL — ABNORMAL LOW (ref 13.0–17.0)
Immature Granulocytes: 0 %
Lymphocytes Relative: 43 %
Lymphs Abs: 3.2 10*3/uL (ref 0.7–4.0)
MCH: 27.6 pg (ref 26.0–34.0)
MCHC: 32.1 g/dL (ref 30.0–36.0)
MCV: 86 fL (ref 80.0–100.0)
Monocytes Absolute: 0.6 10*3/uL (ref 0.1–1.0)
Monocytes Relative: 8 %
Neutro Abs: 3.3 10*3/uL (ref 1.7–7.7)
Neutrophils Relative %: 45 %
Platelets: 187 10*3/uL (ref 150–400)
RBC: 4.64 MIL/uL (ref 4.22–5.81)
RDW: 14.9 % (ref 11.5–15.5)
WBC: 7.4 10*3/uL (ref 4.0–10.5)
nRBC: 0 % (ref 0.0–0.2)

## 2020-01-28 MED ORDER — DENOSUMAB 120 MG/1.7ML ~~LOC~~ SOLN
120.0000 mg | Freq: Once | SUBCUTANEOUS | Status: AC
  Administered 2020-01-28: 120 mg via SUBCUTANEOUS

## 2020-01-28 MED ORDER — DENOSUMAB 120 MG/1.7ML ~~LOC~~ SOLN
SUBCUTANEOUS | Status: AC
  Filled 2020-01-28: qty 1.7

## 2020-01-28 NOTE — Progress Notes (Signed)
HEMATOLOGY/ONCOLOGY CLINIC NOTE  Date of Service: 01/28/2020  Patient Care Team: [name], MD as PCP - General (Family Medicine) [name], MD (Cardiology) [name], MD (Cardiology) [name], DO as Consulting Physician (Neurology)  CHIEF COMPLAINTS/PURPOSE OF CONSULTATION:  Continue mx of myeloma  HISTORY OF PRESENTING ILLNESS:   Jonathon [name] is a wonderful 84 y.o. male who has been referred to [place] by Dr. [name] for evaluation and management of Lytic bone lesions. He is accompanied today by his daughter. The pt reports that he is doing well overall. He is currently a resident at [facility] in [place], [place].   The pt presented to the ED on 03/24/18 with a right distal clavicle fracture, which occurred after trying to get out of bed and pushing himself up with his right arm. He notes that his energy has been noticeably decreased for the last month. He also notes that his lower back has a slight sting when trying to stand up from sitting. He also notes some pain in his lower, right chest wall, which is exacerbated when coughing. The pt has been taking 25m Tramadol which is controlling his pain. He does endorse some weakening of both of his lower extremities in the past 1-2 months, which he attributes somewhat to his recent lower back pain.   The pt denies any bowel abnormalities recently. He notes that he stays very well hydrated, and urinates 4-5 times a night, and does have BPH, and takes Flomax. The pt denies any recent changes in his urination habits.   The pt notes that his appetite decreased for a few months, until he began assisted living. He has lost 9 pounds in the last week.   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 He now uses a walker to ambulate. He notes that the CIDP symptoms are primarily located to his legs.  He sees Dr. [name] Neurology. He took steroids without any symptomatic relief. IVIG was cost-prohibitive for the patient, but was recommended.    Regarding the patient's anemia in April 2019, he denies any obvious bleeding at that time, and denies surgeries.    The pt notes that his last colonoscopy, in 2015, was not concerning, which is corroborated by chart review.   The pt and daughter deny any concerns for memory issues, and the daughter adds his memory is "elephant-like". They are not sure why Namenda is on his medication list.   The pt denies any recent dental procedures, or concerns for dental issues. He has maintained every 6 month dental cleanings for all of his life.   Of note prior to the patient's visit today, pt has had a CT C/A/P completed on 03/31/18 with results revealing IMPRESSION: 1. Lytic lesion throughout all visualized vertebra from the lower cervical spine through the sacrum. Epidural extension of tumor T2, T3 and L2 level. Right L3-4 foraminal extension tumor. 2. Lytic lesions involving majority of ribs. Multiple pathologic rib fractures with bony expansion/sub pleural extension of tumor at several levels. 3. Lytic lesions of the scapula, hips bilaterally and pelvis bilaterally. With further progression of tumor, patient may be at risk for pathologic fractures. Pathologic fracture right clavicle incompletely assessed. 4. Circumferential narrowing sigmoid colon. Question possibility of underlying mass (series 2, image 97). Alternatively this could represent muscular hypertrophy/peristalsis. 5. Gastric  antrum circumferential narrowing. Cannot exclude mass although this may be related to peristalsis. 6. No primary lung malignancy noted. 7. Matted aortic pulmonary window lymph nodes. 8. Circumferential urinary bladder wall thickening greater anterior dome region. Right lateral bladder diverticulum. 9. 5 mm low-density lesion pancreatic body (series 5, image 10) unchanged.  This is felt unlikely to be a primary malignancy. 10. Right parapelvic 2.2 cm cyst without significant change. Scattered low-density renal lesions bila2terally too small to adequately characterize. Some were present previously. Statistically these are likely cysts. 11. Stable adrenal gland hyperplasia. 12. Gynecomastia. 13. Prostate gland causes slight impression upon the bladder base. 14.  Aortic Atherosclerosis. 15. Sequential pacemaker in place. 16. Gallstones.  Most recent lab results (03/25/18) of CBC w/diff and CMP is as follows: all values are WNL except for RBC at 4.14, HGB at 11.3, HCT at 33.6, Chloride at 94, CO2 at 33, Glucose at 100, BUN at 25, Total Protein at 9.4, Albumin at 3.4, Calcium at 13.8, GFR at 58. 03/25/18 PSA, Total was normal at 1.2  On review of systems, pt reports positional lower back pain, lower right chest wall pain, recently decreased energy levels, relatively weaker appetite, weight loss, right clavicle pain, weakening of the lower extremities, moving his bowels well, and denies changes in urination, abdominal pains, changes in breathing, changes in bowel habits, pain along the spine, leg swelling, and any other symptoms.  Interval History:   Jonathon [name] returns today for management and evaluation of his Multiple Myeloma. We are joined today by his daughter. The patient's last visit with [place] was on 09/24/2019. The pt reports that he is doing well overall.  The pt reports that he has started a daily Vitamin D3 and is getting plenty of dietary Calcium. Pt continues to ride his stationary bike regularly. The numbness in his fingers and toes is stable at this time.   Lab results today (01/28/20) of CBC w/diff and CMP is as follows: all values are WNL except for Hgb at 12.8. 01/28/2020 MMP is in progress  On review of systems, pt denies new bone pain, back pain, worsening neuropathy, leg pain and any other symptoms.   MEDICAL HISTORY:  Past Medical History:  Diagnosis  Date  . Asthma   . BENIGN PROSTATIC HYPERTROPHY, HX OF 10/25/2008  . Bone metastases ([place]) 04/14/2018  . BRADYCARDIA 2005  . Chronic diastolic congestive heart failure ([place]) 06/05/2016  . CIDP (chronic inflammatory demyelinating polyneuropathy) ([place])   . HYPERTENSION 11/21/2006  . Iron deficiency anemia, unspecified 04/19/2013  . Multiple myeloma ([place]) 04/29/2018  . 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 PACEMAKER, PERMANENT 2005   Gen change 2014 Medtronic Adaptic L dual-chamber pacemaker, serial [id number]   . SVT (supraventricular tachycardia) ([place])   . TOBACCO ABUSE 10/25/2008   Quit 2012    SURGICAL HISTORY: Past Surgical History:  Procedure Laterality Date  . COLONOSCOPY  [id number]  . PACEMAKER INSERTION  2005  . PERMANENT PACEMAKER GENERATOR CHANGE N/A 06/19/2012   Procedure: PERMANENT PACEMAKER GENERATOR CHANGE;  Surgeon: [name], MD; Medtronic Adaptic L dual-chamber pacemaker, serial #[id number]    . SKIN GRAFT Right 1962   wrist    SOCIAL HISTORY: Social History   Socioeconomic History  . Marital status: Divorced    Spouse name: Not on file  . Number of children: 4  . Years of education: Not on file  . Highest education level: Not on file  Occupational History  . Occupation: ENVIROMENTAL SERVICE  Employer: [employer]  . Occupation: retired  Tobacco Use  . Smoking status: Former Smoker    Packs/day: 0.25    Years: 46.00    Pack years: 11.50    Types: Cigarettes    Start date: 03/16/1965    Quit date: 06/15/2010    Years since quitting: 9.6  . Smokeless tobacco: Never Used  . Tobacco comment: quit 3 years ago  Vaping Use  . Vaping Use: Never used  Substance and Sexual Activity  . Alcohol use: No    Alcohol/week: 0.0 standard drinks  . Drug use: No  . Sexual activity: Not on file  Other Topics Concern  . Not on file  Social History Narrative   Has relocated from [place] in 2007. 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  Patient in a facility thru  [facility]   Social Determinants of Health   Financial Resource Strain:   . Difficulty of Paying Living Expenses: Not on file  Food Insecurity:   . Worried About Charity fundraiser in the Last Year: Not on file  . Ran Out of Food in the Last Year: Not on file  Transportation Needs:   . Lack of Transportation (Medical): Not on file  . Lack of Transportation (Non-Medical): Not on file  Physical Activity:   . Days of Exercise per Week: Not on file  . Minutes of Exercise per Session: Not on file  Stress:   . Feeling of Stress : Not on file  Social Connections:   . Frequency of Communication with Friends and Family: Not on file  . Frequency of Social Gatherings with Friends and Family: Not on file  . Attends Religious Services: Not on file  . Active Member of Clubs or Organizations: Not on file  . Attends Archivist Meetings: Not on file  . Marital Status: Not on file  Intimate Partner Violence:   . Fear of Current or Ex-Partner: Not on file  . Emotionally Abused: Not on file  . Physically Abused: Not on file  . Sexually Abused: Not on file    FAMILY HISTORY: Family History  Problem Relation Age of Onset  . Heart attack Father        Died, 35  . Kidney disease Father   . Parkinson's disease Mother        Died, 46  . Breast cancer Sister        Living, 77  . Diabetes type II Brother        Living, 20  . Breast cancer Sister        Living, 23  . Diabetes Daughter        Living, 10  . Diabetes Son        Living, 22  . Ovarian cancer Daughter   . Colon cancer Neg Hx   . Rectal cancer Neg Hx   . Stomach cancer Neg Hx     ALLERGIES:  is allergic to lexapro [escitalopram oxalate].  MEDICATIONS:  Current Outpatient Medications  Medication Sig Dispense Refill  . aspirin EC 81 MG tablet Take 81 mg by mouth daily.    Marland Kitchen diltiazem (TIAZAC) 240 MG 24 hr capsule Take 1 capsule by mouth daily.    . flecainide (TAMBOCOR) 50 MG tablet Take 75 mg by mouth daily as  needed.    . furosemide (LASIX) 40 MG tablet Take 40 mg by mouth daily.    . irbesartan (AVAPRO) 300 MG tablet Take 300 mg by mouth daily.    Marland Kitchen  memantine (NAMENDA) 5 MG tablet Take 5 mg by mouth 2 (two) times daily.    . metoprolol tartrate (LOPRESSOR) 50 MG tablet Take 1 tablet (50 mg total) by mouth 2 (two) times daily. 60 tablet 0  . polyethylene glycol (MIRALAX / GLYCOLAX) packet Take 17 g by mouth daily. 14 each 0  . Saccharomyces boulardii (FLORASTOR PO) Take 250 mg by mouth 2 (two) times daily.    . tamsulosin (FLOMAX) 0.4 MG CAPS capsule Take 2 capsules (0.8 mg total) by mouth daily. 90 capsule 0   No current facility-administered medications for this visit.   Facility-Administered Medications Ordered in Other Visits  Medication Dose Route Frequency Provider Last Rate Last Admin  . sodium chloride flush (NS) 0.9 % injection 10 mL  10 mL Intracatheter PRN Brunetta Genera, MD        REVIEW OF SYSTEMS:   A 10+ POINT REVIEW OF SYSTEMS WAS OBTAINED including neurology, dermatology, psychiatry, cardiac, respiratory, lymph, extremities, GI, GU, Musculoskeletal, constitutional, breasts, reproductive, HEENT.  All pertinent positives are noted in the HPI.  All others are negative.   PHYSICAL EXAMINATION: ECOG PERFORMANCE STATUS: 2 - Symptomatic, <50% confined to bed  .BP (!) 174/86 (BP Location: Left Arm, Patient Position: Sitting)   Pulse 62   Temp 97.8 F (36.6 C) (Tympanic)   Resp 18   Ht _0  (1.803 m)   Wt 207 lb 1.6 oz (93.9 kg)   SpO2 100%   BMI 28.88 kg/m   Exam was given in a chair   GENERAL:alert, in no acute distress and comfortable SKIN: no acute rashes, no significant lesions EYES: conjunctiva are pink and non-injected, sclera anicteric OROPHARYNX: MMM, no exudates, no oropharyngeal erythema or ulceration NECK: supple, no JVD LYMPH:  no palpable lymphadenopathy in the cervical, axillary or inguinal regions LUNGS: clear to auscultation b/l with normal  respiratory effort HEART: regular rate & rhythm ABDOMEN:  normoactive bowel sounds , non tender, not distended. No palpable hepatosplenomegaly.  Extremity: no pedal edema PSYCH: alert & oriented x 3 with fluent speech NEURO: no focal motor/sensory deficits  LABORATORY DATA:  I have reviewed the data as listed  . CBC Latest Ref Rng & Units 01/28/2020 11/26/2019 09/17/2019  WBC 4.0 - 10.5 K/uL 7.4 6.5 6.3  Hemoglobin 13.0 - 17.0 g/dL 12.8(L) 12.8(L) 13.0  Hematocrit 39 - 52 % 39.9 39.9 41.5  Platelets 150 - 400 K/uL 187 176 153    . CMP Latest Ref Rng & Units 01/28/2020 11/26/2019 09/17/2019  Glucose 70 - 99 mg/dL 98 95 87  BUN 8 - 23 mg/dL _1 Creatinine 0.61 - 1.24 mg/dL 1.20 1.02 1.11  Sodium 135 - 145 mmol/L 140 141 144  Potassium 3.5 - 5.1 mmol/L 3.7 4.2 3.6  Chloride 98 - 111 mmol/L 103 103 105  CO2 22 - 32 mmol/L _2 Calcium 8.9 - 10.3 mg/dL 9.4 9.6 8.5(L)  Total Protein 6.5 - 8.1 g/dL 7.2 7.1 6.7  Total Bilirubin 0.3 - 1.2 mg/dL 0.6 0.9 0.9  Alkaline Phos 38 - 126 U/L 68 81 67  AST 15 - 41 U/L _3 ALT 0 - 44 U/L _4 12/14/2018 MMP    12/14/2018 MMP    04/17/18 BM Bx:    RADIOGRAPHIC STUDIES: I have personally reviewed the radiological images as listed and agreed with the findings in the report. CUP PACEART REMOTE DEVICE CHECK  Result Date: 01/06/2020 Scheduled remote reviewed. Normal  device function.  Next remote 91 days. Kathy Breach, RN, CCDS, [employer]   ASSESSMENT & PLAN:  83 y.o. male with  1. Lytic bone lesions - w/u consistent with newly diagnosed multiple myeloma Labs upon initial presentation from 04/14/18; hypercalcemic with Calcium at 13.8, renal function up form baseline with Creatinine at 1.48, HGB at 11.3. PSA was normal.   03/31/18 CT C/A/P revealed 1. Lytic lesion throughout all visualized vertebra from the lower cervical spine through the sacrum. Epidural extension of tumor T2, T3 and L2 level. Right L3-4  foraminal extension tumor. 2. Lytic lesions involving majority of ribs. Multiple pathologic rib fractures with bony expansion/sub pleural extension of tumor at several levels. 3. Lytic lesions of the scapula, hips bilaterally and pelvis bilaterally. With further progression of tumor, patient may be at risk for pathologic fractures. Pathologic fracture right clavicle incompletely assessed. 4. Circumferential narrowing sigmoid colon. Question possibility of underlying mass (series 2, image 97). Alternatively this could represent muscular hypertrophy/peristalsis. 5. Gastric antrum circumferential narrowing. Cannot exclude mass although this may be related to peristalsis. 6. No primary lung malignancy noted. 7. Matted aortic pulmonary window lymph nodes. 8. Circumferential urinary bladder wall thickening greater anterior dome region. Right lateral bladder diverticulum. 9. 5 mm low-density lesion pancreatic body (series 5, image 10) unchanged. This is felt unlikely to be a primary malignancy. 10. Right parapelvic 2.2 cm cyst without significant change. Scattered low-density renal lesions bilaterally too small to adequately characterize. Some were present previously. Statistically these are likely cysts. 11. Stable adrenal gland hyperplasia. 12. Gynecomastia. 13. Prostate gland causes slight impression upon the bladder base. 14.  Aortic Atherosclerosis. 15. Sequential pacemaker in place. 16. Gallstones.   04/23/18 PET/CT revealed Innumerable hypermetabolic lytic lesions throughout the skeleton especially involving the spine, ribs, bony pelvis along with some involvement of both proximal femurs. The appearance is compatible with pathologic diagnosis of plasma cell neoplasm could well reflect multiple myeloma. Resulting pathologic fractures of the right lateral clavicle and of multiple bilateral ribs. Lesions are present along the cortical margins of the spinal canal. 2. Both the area and the stomach in the area in the  sigmoid colon drawn attention to on prior CT scan appear benign normal today. 3. The confluence of AP window lymph nodes measures about 1.2 cm in short axis with maximum SUV 3.1, which is mildly above the blood pool merit surveillance. 4. Other imaging findings of potential clinical significance: Aortic Atherosclerosis. Coronary atherosclerosis. Pacemaker noted. Borderline prostatomegaly. Cholelithiasis.  04/21/18 BM Bx revealed Normocellular bone marrow with Plasma Cell neoplasm. Scattered medium sized clusters of kappa-restricted plasma cells (14% aspirate, 10% CD138 immunohistochemistry.   I do suspect that the burden of disease is underestimated in the bone marrow sample given PET/CT findings of innumerable lytic lesions and an M spike of 3.4g   06/08/18 DG Hips bilat which revealed Innumerable lucencies are noted throughout the pelvis and proximal femurs, similar findings noted on prior PET-CT of 04/23/2018. Findings are consistent patient's known multiple myeloma. Femoral necks are intact. 2.  Aortoiliac atherosclerotic vascular disease.  2. Pathologic right clavicle fracture due to myeloma- resolved 3. Hypercalcemia- resolved.  PLAN:    -Discussed pt labwork today, 01/28/20; blood counts look good, blood chemistries are nml.  -Discussed 01/28/2020 MMP is in progress, 11/26/2019 MMP shows no M Protein observed -Pt continues to be in remission from Multiple Myeloma. -Discussed CDC guidelines regarding the [place] booster. Plan to give in clinic today.  -Recommend pt receive the annual flu  vaccine. Wait two weeks between vaccinations if possible.  -Continue OTC Vitamin D  -Continue Xgeva q8weeks -Will see back in 4 months with labs    FOLLOW UP: Covid booster vaccine today Plz schedule Xgeva every 2 months with labs- plz schedule next 4 doses RTC with Dr Irene Limbo in 4 months   The total time spent in the appt was 20 minutes and more than 50% was on counseling and direct patient  cares.  All of the patient's questions were answered with apparent satisfaction. The patient knows to call the clinic with any problems, questions or concerns.    Sullivan Lone MD [place] AAHIVMS [facility] Hematology/Oncology Physician [facility]  (Office):       [phone] (Work cell):  [phone] (Fax):           [phone]  01/28/2020 12:11 PM  I, Yevette Edwards, am acting as a scribe for Dr. Sullivan Lone.   .I have reviewed the above documentation for accuracy and completeness, and I agree with the above. Brunetta Genera MD

## 2020-01-28 NOTE — Patient Instructions (Signed)
Denosumab injection What is this medicine? DENOSUMAB (den oh sue mab) slows bone breakdown. Prolia is used to treat osteoporosis in women after menopause and in men, and in people who are taking corticosteroids for 6 months or more. Xgeva is used to treat a high calcium level due to cancer and to prevent bone fractures and other bone problems caused by multiple myeloma or cancer bone metastases. Xgeva is also used to treat giant cell tumor of the bone. This medicine may be used for other purposes; ask your health care provider or pharmacist if you have questions. COMMON BRAND NAME(S): Prolia, XGEVA What should I tell my health care provider before I take this medicine? They need to know if you have any of these conditions:  dental disease  having surgery or tooth extraction  infection  kidney disease  low levels of calcium or Vitamin D in the blood  malnutrition  on hemodialysis  skin conditions or sensitivity  thyroid or parathyroid disease  an unusual reaction to denosumab, other medicines, foods, dyes, or preservatives  pregnant or trying to get pregnant  breast-feeding How should I use this medicine? This medicine is for injection under the skin. It is given by a health care professional in a hospital or clinic setting. A special MedGuide will be given to you before each treatment. Be sure to read this information carefully each time. For Prolia, talk to your pediatrician regarding the use of this medicine in children. Special care may be needed. For Xgeva, talk to your pediatrician regarding the use of this medicine in children. While this drug may be prescribed for children as young as 13 years for selected conditions, precautions do apply. Overdosage: If you think you have taken too much of this medicine contact a poison control center or emergency room at once. NOTE: This medicine is only for you. Do not share this medicine with others. What if I miss a dose? It is  important not to miss your dose. Call your doctor or health care professional if you are unable to keep an appointment. What may interact with this medicine? Do not take this medicine with any of the following medications:  other medicines containing denosumab This medicine may also interact with the following medications:  medicines that lower your chance of fighting infection  steroid medicines like prednisone or cortisone This list may not describe all possible interactions. Give your health care provider a list of all the medicines, herbs, non-prescription drugs, or dietary supplements you use. Also tell them if you smoke, drink alcohol, or use illegal drugs. Some items may interact with your medicine. What should I watch for while using this medicine? Visit your doctor or health care professional for regular checks on your progress. Your doctor or health care professional may order blood tests and other tests to see how you are doing. Call your doctor or health care professional for advice if you get a fever, chills or sore throat, or other symptoms of a cold or flu. Do not treat yourself. This drug may decrease your body's ability to fight infection. Try to avoid being around people who are sick. You should make sure you get enough calcium and vitamin D while you are taking this medicine, unless your doctor tells you not to. Discuss the foods you eat and the vitamins you take with your health care professional. See your dentist regularly. Brush and floss your teeth as directed. Before you have any dental work done, tell your dentist you are   receiving this medicine. Do not become pregnant while taking this medicine or for 5 months after stopping it. Talk with your doctor or health care professional about your birth control options while taking this medicine. Women should inform their doctor if they wish to become pregnant or think they might be pregnant. There is a potential for serious side  effects to an unborn child. Talk to your health care professional or pharmacist for more information. What side effects may I notice from receiving this medicine? Side effects that you should report to your doctor or health care professional as soon as possible:  allergic reactions like skin rash, itching or hives, swelling of the face, lips, or tongue  bone pain  breathing problems  dizziness  jaw pain, especially after dental work  redness, blistering, peeling of the skin  signs and symptoms of infection like fever or chills; cough; sore throat; pain or trouble passing urine  signs of low calcium like fast heartbeat, muscle cramps or muscle pain; pain, tingling, numbness in the hands or feet; seizures  unusual bleeding or bruising  unusually weak or tired Side effects that usually do not require medical attention (report to your doctor or health care professional if they continue or are bothersome):  constipation  diarrhea  headache  joint pain  loss of appetite  muscle pain  runny nose  tiredness  upset stomach This list may not describe all possible side effects. Call your doctor for medical advice about side effects. You may report side effects to FDA at 1-800-FDA-1088. Where should I keep my medicine? This medicine is only given in a clinic, doctor's office, or other health care setting and will not be stored at home. NOTE: This sheet is a summary. It may not cover all possible information. If you have questions about this medicine, talk to your doctor, pharmacist, or health care provider.  2020 Elsevier/Gold Standard (2017-09-05 16:10:44)  

## 2020-01-28 NOTE — Telephone Encounter (Signed)
Scheduled per 9/17 los. Printed avs and calendar for pt.

## 2020-01-31 LAB — MULTIPLE MYELOMA PANEL, SERUM
Albumin SerPl Elph-Mcnc: 3.6 g/dL (ref 2.9–4.4)
Albumin/Glob SerPl: 1.2 (ref 0.7–1.7)
Alpha 1: 0.2 g/dL (ref 0.0–0.4)
Alpha2 Glob SerPl Elph-Mcnc: 1 g/dL (ref 0.4–1.0)
B-Globulin SerPl Elph-Mcnc: 1.2 g/dL (ref 0.7–1.3)
Gamma Glob SerPl Elph-Mcnc: 0.8 g/dL (ref 0.4–1.8)
Globulin, Total: 3.2 g/dL (ref 2.2–3.9)
IgA: 295 mg/dL (ref 61–437)
IgG (Immunoglobin G), Serum: 763 mg/dL (ref 603–1613)
IgM (Immunoglobulin M), Srm: 57 mg/dL (ref 15–143)
Total Protein ELP: 6.8 g/dL (ref 6.0–8.5)

## 2020-02-07 DIAGNOSIS — Z1152 Encounter for screening for COVID-19: Secondary | ICD-10-CM | POA: Diagnosis not present

## 2020-02-28 DIAGNOSIS — Z23 Encounter for immunization: Secondary | ICD-10-CM | POA: Diagnosis not present

## 2020-03-08 ENCOUNTER — Telehealth: Payer: Self-pay | Admitting: *Deleted

## 2020-03-08 NOTE — Telephone Encounter (Signed)
Contacted by Steffanie Dunn from [facility] [phone]. She requested clarification regarding Dexamethasone - if patient was to continue or not - please send faxed signed MD order for patient. Dr. Irene Limbo stated patient does not need to take it at this time. Signed order faxed to [phone] as requested and fax confirmation received.

## 2020-03-22 DIAGNOSIS — Z23 Encounter for immunization: Secondary | ICD-10-CM | POA: Diagnosis not present

## 2020-03-28 DIAGNOSIS — B351 Tinea unguium: Secondary | ICD-10-CM | POA: Diagnosis not present

## 2020-03-28 DIAGNOSIS — M79675 Pain in left toe(s): Secondary | ICD-10-CM | POA: Diagnosis not present

## 2020-03-29 ENCOUNTER — Ambulatory Visit: Payer: Medicare Other

## 2020-03-29 ENCOUNTER — Other Ambulatory Visit: Payer: Medicare Other

## 2020-03-31 ENCOUNTER — Other Ambulatory Visit: Payer: Self-pay

## 2020-03-31 ENCOUNTER — Inpatient Hospital Stay: Payer: Medicare Other

## 2020-03-31 ENCOUNTER — Inpatient Hospital Stay: Payer: Medicare Other | Attending: Hematology

## 2020-03-31 VITALS — BP 170/91 | HR 59

## 2020-03-31 DIAGNOSIS — C9000 Multiple myeloma not having achieved remission: Secondary | ICD-10-CM

## 2020-03-31 DIAGNOSIS — C9001 Multiple myeloma in remission: Secondary | ICD-10-CM | POA: Diagnosis not present

## 2020-03-31 DIAGNOSIS — C7951 Secondary malignant neoplasm of bone: Secondary | ICD-10-CM | POA: Insufficient documentation

## 2020-03-31 DIAGNOSIS — Z7189 Other specified counseling: Secondary | ICD-10-CM

## 2020-03-31 DIAGNOSIS — Z5112 Encounter for antineoplastic immunotherapy: Secondary | ICD-10-CM

## 2020-03-31 LAB — CBC WITH DIFFERENTIAL/PLATELET
Abs Immature Granulocytes: 0.01 10*3/uL (ref 0.00–0.07)
Basophils Absolute: 0.1 10*3/uL (ref 0.0–0.1)
Basophils Relative: 1 %
Eosinophils Absolute: 0.4 10*3/uL (ref 0.0–0.5)
Eosinophils Relative: 6 %
HCT: 39.2 % (ref 39.0–52.0)
Hemoglobin: 12.4 g/dL — ABNORMAL LOW (ref 13.0–17.0)
Immature Granulocytes: 0 %
Lymphocytes Relative: 47 %
Lymphs Abs: 3.1 10*3/uL (ref 0.7–4.0)
MCH: 27.2 pg (ref 26.0–34.0)
MCHC: 31.6 g/dL (ref 30.0–36.0)
MCV: 86 fL (ref 80.0–100.0)
Monocytes Absolute: 0.6 10*3/uL (ref 0.1–1.0)
Monocytes Relative: 10 %
Neutro Abs: 2.4 10*3/uL (ref 1.7–7.7)
Neutrophils Relative %: 36 %
Platelets: 162 10*3/uL (ref 150–400)
RBC: 4.56 MIL/uL (ref 4.22–5.81)
RDW: 14.3 % (ref 11.5–15.5)
WBC: 6.6 10*3/uL (ref 4.0–10.5)
nRBC: 0 % (ref 0.0–0.2)

## 2020-03-31 LAB — CMP (CANCER CENTER ONLY)
ALT: 7 U/L (ref 0–44)
AST: 18 U/L (ref 15–41)
Albumin: 3.6 g/dL (ref 3.5–5.0)
Alkaline Phosphatase: 70 U/L (ref 38–126)
Anion gap: 5 (ref 5–15)
BUN: 13 mg/dL (ref 8–23)
CO2: 31 mmol/L (ref 22–32)
Calcium: 9.4 mg/dL (ref 8.9–10.3)
Chloride: 103 mmol/L (ref 98–111)
Creatinine: 1.13 mg/dL (ref 0.61–1.24)
GFR, Estimated: >60 mL/min (ref >60)
Glucose, Bld: 91 mg/dL (ref 70–99)
Potassium: 4.5 mmol/L (ref 3.5–5.1)
Sodium: 139 mmol/L (ref 135–145)
Total Bilirubin: 0.6 mg/dL (ref 0.3–1.2)
Total Protein: 7.1 g/dL (ref 6.5–8.1)

## 2020-03-31 MED ORDER — DENOSUMAB 120 MG/1.7ML ~~LOC~~ SOLN
SUBCUTANEOUS | Status: AC
  Filled 2020-03-31: qty 1.7

## 2020-03-31 MED ORDER — DENOSUMAB 120 MG/1.7ML ~~LOC~~ SOLN
120.0000 mg | Freq: Once | SUBCUTANEOUS | Status: AC
  Administered 2020-03-31: 120 mg via SUBCUTANEOUS

## 2020-04-04 LAB — MULTIPLE MYELOMA PANEL, SERUM
Albumin SerPl Elph-Mcnc: 3.3 g/dL (ref 2.9–4.4)
Albumin/Glob SerPl: 1.1 (ref 0.7–1.7)
Alpha 1: 0.2 g/dL (ref 0.0–0.4)
Alpha2 Glob SerPl Elph-Mcnc: 0.9 g/dL (ref 0.4–1.0)
B-Globulin SerPl Elph-Mcnc: 1.1 g/dL (ref 0.7–1.3)
Gamma Glob SerPl Elph-Mcnc: 1 g/dL (ref 0.4–1.8)
Globulin, Total: 3.2 g/dL (ref 2.2–3.9)
IgA: 364 mg/dL (ref 61–437)
IgG (Immunoglobin G), Serum: 781 mg/dL (ref 603–1613)
IgM (Immunoglobulin M), Srm: 50 mg/dL (ref 15–143)
M Protein SerPl Elph-Mcnc: 0.3 g/dL — ABNORMAL HIGH
Total Protein ELP: 6.5 g/dL (ref 6.0–8.5)

## 2020-04-05 ENCOUNTER — Ambulatory Visit (INDEPENDENT_AMBULATORY_CARE_PROVIDER_SITE_OTHER): Payer: Medicare Other

## 2020-04-05 DIAGNOSIS — R001 Bradycardia, unspecified: Secondary | ICD-10-CM

## 2020-04-05 LAB — CUP PACEART REMOTE DEVICE CHECK
Battery Impedance: 956 Ohm
Battery Remaining Longevity: 65 mo
Battery Voltage: 2.77 V
Brady Statistic AP VP Percent: 24 %
Brady Statistic AP VS Percent: 0 %
Brady Statistic AS VP Percent: 58 %
Brady Statistic AS VS Percent: 19 %
Date Time Interrogation Session: 20211124092234
Implantable Lead Implant Date: 20050121
Implantable Lead Implant Date: 20050121
Implantable Lead Location: 753859
Implantable Lead Location: 753860
Implantable Lead Model: 5076
Implantable Lead Model: 5076
Implantable Pulse Generator Implant Date: 20140207
Lead Channel Impedance Value: 550 Ohm
Lead Channel Impedance Value: 702 Ohm
Lead Channel Pacing Threshold Amplitude: 0.375 V
Lead Channel Pacing Threshold Amplitude: 1.25 V
Lead Channel Pacing Threshold Pulse Width: 0.4 ms
Lead Channel Pacing Threshold Pulse Width: 0.4 ms
Lead Channel Setting Pacing Amplitude: 2.5 V
Lead Channel Setting Pacing Amplitude: 2.5 V
Lead Channel Setting Pacing Pulse Width: 0.4 ms
Lead Channel Setting Sensing Sensitivity: 2 mV

## 2020-04-13 NOTE — Progress Notes (Signed)
Remote pacemaker transmission.   

## 2020-05-22 DIAGNOSIS — Z20828 Contact with and (suspected) exposure to other viral communicable diseases: Secondary | ICD-10-CM | POA: Diagnosis not present

## 2020-05-29 ENCOUNTER — Other Ambulatory Visit: Payer: Medicare Other

## 2020-05-29 ENCOUNTER — Ambulatory Visit: Payer: Medicare Other | Admitting: Hematology

## 2020-05-29 ENCOUNTER — Ambulatory Visit: Payer: Medicare Other

## 2020-06-01 NOTE — Progress Notes (Signed)
HEMATOLOGY/ONCOLOGY CLINIC NOTE  Date of Service: 06/01/2020  Patient Care Team: [name], MD as PCP - General (Family Medicine) [name], MD (Cardiology) [name], MD (Cardiology) [name], DO as Consulting Physician (Neurology)  CHIEF COMPLAINTS/PURPOSE OF CONSULTATION:  Continue mx of myeloma  HISTORY OF PRESENTING ILLNESS:   Jonathon [name] is a wonderful 85 y.o. male who has been referred to [place] by Dr. [name] for evaluation and management of Lytic bone lesions. He is accompanied today by his daughter. The pt reports that he is doing well overall. He is currently a resident at [facility] in [place], [place].   The pt presented to the ED on 03/24/18 with a right distal clavicle fracture, which occurred after trying to get out of bed and pushing himself up with his right arm. He notes that his energy has been noticeably decreased for the last month. He also notes that his lower back has a slight sting when trying to stand up from sitting. He also notes some pain in his lower, right chest wall, which is exacerbated when coughing. The pt has been taking 79m Tramadol which is controlling his pain. He does endorse some weakening of both of his lower extremities in the past 1-2 months, which he attributes somewhat to his recent lower back pain.   The pt denies any bowel abnormalities recently. He notes that he stays very well hydrated, and urinates 4-5 times a night, and does have BPH, and takes Flomax. The pt denies any recent changes in his urination habits.   The pt notes that his appetite decreased for a few months, until he began assisted living. He has lost 9 pounds in the last week.   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 He now uses a walker to ambulate. He notes that the CIDP symptoms are primarily located to his legs.  He sees Dr. [name] Neurology. He took steroids without any symptomatic relief. IVIG was cost-prohibitive for the patient, but was recommended.    Regarding the patient's anemia in April 2019, he denies any obvious bleeding at that time, and denies surgeries.    The pt notes that his last colonoscopy, in 2015, was not concerning, which is corroborated by chart review.   The pt and daughter deny any concerns for memory issues, and the daughter adds his memory is "elephant-like". They are not sure why Namenda is on his medication list.   The pt denies any recent dental procedures, or concerns for dental issues. He has maintained every 6 month dental cleanings for all of his life.   Of note prior to the patient's visit today, pt has had a CT C/A/P completed on 03/31/18 with results revealing IMPRESSION: 1. Lytic lesion throughout all visualized vertebra from the lower cervical spine through the sacrum. Epidural extension of tumor T2, T3 and L2 level. Right L3-4 foraminal extension tumor. 2. Lytic lesions involving majority of ribs. Multiple pathologic rib fractures with bony expansion/sub pleural extension of tumor at several levels. 3. Lytic lesions of the scapula, hips bilaterally and pelvis bilaterally. With further progression of tumor, patient may be at risk for pathologic fractures. Pathologic fracture right clavicle incompletely assessed. 4. Circumferential narrowing sigmoid colon. Question possibility of underlying mass (series 2, image 97). Alternatively this could represent muscular hypertrophy/peristalsis. 5. Gastric  antrum circumferential narrowing. Cannot exclude mass although this may be related to peristalsis. 6. No primary lung malignancy noted. 7. Matted aortic pulmonary window lymph nodes. 8. Circumferential urinary bladder wall thickening greater anterior dome region. Right lateral bladder diverticulum. 9. 5 mm low-density lesion pancreatic body (series 5, image 10) unchanged.  This is felt unlikely to be a primary malignancy. 10. Right parapelvic 2.2 cm cyst without significant change. Scattered low-density renal lesions bila2terally too small to adequately characterize. Some were present previously. Statistically these are likely cysts. 11. Stable adrenal gland hyperplasia. 12. Gynecomastia. 13. Prostate gland causes slight impression upon the bladder base. 14.  Aortic Atherosclerosis. 15. Sequential pacemaker in place. 16. Gallstones.  Most recent lab results (03/25/18) of CBC w/diff and CMP is as follows: all values are WNL except for RBC at 4.14, HGB at 11.3, HCT at 33.6, Chloride at 94, CO2 at 33, Glucose at 100, BUN at 25, Total Protein at 9.4, Albumin at 3.4, Calcium at 13.8, GFR at 58. 03/25/18 PSA, Total was normal at 1.2  On review of systems, pt reports positional lower back pain, lower right chest wall pain, recently decreased energy levels, relatively weaker appetite, weight loss, right clavicle pain, weakening of the lower extremities, moving his bowels well, and denies changes in urination, abdominal pains, changes in breathing, changes in bowel habits, pain along the spine, leg swelling, and any other symptoms.  Interval History:   Jonathon [name] returns today for management and evaluation of his Multiple Myeloma. We are joined today by his daughter. The patient's last visit with [place] was on 01/28/2020. The pt reports that he is doing well overall.  The pt reports that he has been doing well. He has no new symptoms or concerns. The pt notes that he has been attempting to stay active and no new medication changes.  The pt has received both his COVID vaccines and booster. He recently got tested for COVID and the results came back negative. The pt noted that he recently lost his 17 year old grandchild due to [place].  The pt also noted that he still has his pacemaker in.   Lab results today 06/02/2020 of CBC w/diff and CMP is as follows: all values are WNL except  for Hgb at 12.3, HCT at 38.4, Calcium at 8.8. 06/02/2020 MMP is at 0.3g/dl  On review of systems, pt denies back pain, abdominal pain and any other symptoms.  MEDICAL HISTORY:  Past Medical History:  Diagnosis Date  . Asthma   . BENIGN PROSTATIC HYPERTROPHY, HX OF 10/25/2008  . Bone metastases ([place]) 04/14/2018  . BRADYCARDIA 2005  . Chronic diastolic congestive heart failure ([place]) 06/05/2016  . CIDP (chronic inflammatory demyelinating polyneuropathy) ([place])   . HYPERTENSION 11/21/2006  . Iron deficiency anemia, unspecified 04/19/2013  . Multiple myeloma ([place]) 04/29/2018  . 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 PACEMAKER, PERMANENT 2005   Gen change 2014 Medtronic Adaptic L dual-chamber pacemaker, serial [id number]   . SVT (supraventricular tachycardia) ([place])   . TOBACCO ABUSE 10/25/2008   Quit 2012    SURGICAL HISTORY: Past Surgical History:  Procedure Laterality Date  . COLONOSCOPY  [id number]  . PACEMAKER INSERTION  2005  . PERMANENT PACEMAKER GENERATOR CHANGE N/A 06/19/2012   Procedure: PERMANENT PACEMAKER GENERATOR CHANGE;  Surgeon: [name], MD; Medtronic Adaptic L dual-chamber pacemaker, serial #[id number]    . SKIN GRAFT Right 1962   wrist    SOCIAL HISTORY: Social History   Socioeconomic History  . Marital  status: Divorced    Spouse name: Not on file  . Number of children: 4  . Years of education: Not on file  . Highest education level: Not on file  Occupational History  . Occupation: ENVIROMENTAL Location manager: [employer]  . Occupation: retired  Tobacco Use  . Smoking status: Former Smoker    Packs/day: 0.25    Years: 46.00    Pack years: 11.50    Types: Cigarettes    Start date: 03/16/1965    Quit date: 06/15/2010    Years since quitting: 9.9  . Smokeless tobacco: Never Used  . Tobacco comment: quit 3 years ago  Vaping Use  . Vaping Use: Never used  Substance and Sexual Activity  . Alcohol use: No    Alcohol/week: 0.0  standard drinks  . Drug use: No  . Sexual activity: Not on file  Other Topics Concern  . Not on file  Social History Narrative   Has relocated from [place] in 2007. 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  Patient in a facility thru [facility]   Social Determinants of Health   Financial Resource Strain: Not on file  Food Insecurity: Not on file  Transportation Needs: Not on file  Physical Activity: Not on file  Stress: Not on file  Social Connections: Not on file  Intimate Partner Violence: Not on file    FAMILY HISTORY: Family History  Problem Relation Age of Onset  . Heart attack Father        Died, 85  . Kidney disease Father   . Parkinson's disease Mother        Died, 21  . Breast cancer Sister        Living, 81  . Diabetes type II Brother        Living, 44  . Breast cancer Sister        Living, 85  . Diabetes Daughter        Living, 36  . Diabetes Son        Living, 10  . Ovarian cancer Daughter   . Colon cancer Neg Hx   . Rectal cancer Neg Hx   . Stomach cancer Neg Hx     ALLERGIES:  is allergic to lexapro [escitalopram oxalate].  MEDICATIONS:  Current Outpatient Medications  Medication Sig Dispense Refill  . aspirin EC 81 MG tablet Take 81 mg by mouth daily.    Marland Kitchen diltiazem (TIAZAC) 240 MG 24 hr capsule Take 1 capsule by mouth daily.    . flecainide (TAMBOCOR) 50 MG tablet Take 75 mg by mouth daily as needed.    . furosemide (LASIX) 40 MG tablet Take 40 mg by mouth daily.    . irbesartan (AVAPRO) 300 MG tablet Take 300 mg by mouth daily.    . memantine (NAMENDA) 5 MG tablet Take 5 mg by mouth 2 (two) times daily.    . metoprolol tartrate (LOPRESSOR) 50 MG tablet Take 1 tablet (50 mg total) by mouth 2 (two) times daily. 60 tablet 0  . polyethylene glycol (MIRALAX / GLYCOLAX) packet Take 17 g by mouth daily. 14 each 0  . Saccharomyces boulardii (FLORASTOR PO) Take 250 mg by mouth 2 (two) times daily.    . tamsulosin (FLOMAX) 0.4 MG CAPS capsule Take 2 capsules (0.8 mg total) by  mouth daily. 90 capsule 0   No current facility-administered medications for this visit.   Facility-Administered Medications Ordered in Other Visits  Medication Dose Route Frequency Provider Last Rate  Last Admin  . sodium chloride flush (NS) 0.9 % injection 10 mL  10 mL Intracatheter PRN Brunetta Genera, MD        REVIEW OF SYSTEMS:   10 Point review of Systems was done is negative except as noted above.  PHYSICAL EXAMINATION: ECOG PERFORMANCE STATUS: 2 - Symptomatic, <50% confined to bed  .BP (!) 182/96   Pulse 74   Temp (!) 97 F (36.1 C) (Tympanic)   Resp 18   Ht '5\' 11"'  (1.803 m)   Wt 216 lb 8 oz (98.2 kg)   SpO2 99%   BMI 30.20 kg/m   Exam was given a chair.   GENERAL:alert, in no acute distress and comfortable SKIN: no acute rashes, no significant lesions EYES: conjunctiva are pink and non-injected, sclera anicteric OROPHARYNX: MMM, no exudates, no oropharyngeal erythema or ulceration NECK: supple, no JVD LYMPH:  no palpable lymphadenopathy in the cervical, axillary or inguinal regions LUNGS: clear to auscultation b/l with normal respiratory effort HEART: regular rate & rhythm ABDOMEN:  normoactive bowel sounds , non tender, not distended. Extremity: no pedal edema PSYCH: alert & oriented x 3 with fluent speech NEURO: no focal motor/sensory deficits   LABORATORY DATA:  I have reviewed the data as listed  . CBC Latest Ref Rng & Units 03/31/2020 01/28/2020 11/26/2019  WBC 4.0 - 10.5 K/uL 6.6 7.4 6.5  Hemoglobin 13.0 - 17.0 g/dL 12.4(L) 12.8(L) 12.8(L)  Hematocrit 39.0 - 52.0 % 39.2 39.9 39.9  Platelets 150 - 400 K/uL 162 187 176    . CMP Latest Ref Rng & Units 03/31/2020 01/28/2020 11/26/2019  Glucose 70 - 99 mg/dL 91 98 95  BUN 8 - 23 mg/dL '13 19 11  ' Creatinine 0.61 - 1.24 mg/dL 1.13 1.20 1.02  Sodium 135 - 145 mmol/L 139 140 141  Potassium 3.5 - 5.1 mmol/L 4.5 3.7 4.2  Chloride 98 - 111 mmol/L 103 103 103  CO2 22 - 32 mmol/L '31 26 27  ' Calcium 8.9 -  10.3 mg/dL 9.4 9.4 9.6  Total Protein 6.5 - 8.1 g/dL 7.1 7.2 7.1  Total Bilirubin 0.3 - 1.2 mg/dL 0.6 0.6 0.9  Alkaline Phos 38 - 126 U/L 70 68 81  AST 15 - 41 U/L '18 17 15  ' ALT 0 - 44 U/L '7 10 8   ' 12/14/2018 MMP    12/14/2018 MMP    04/17/18 BM Bx:    RADIOGRAPHIC STUDIES: I have personally reviewed the radiological images as listed and agreed with the findings in the report. No results found.  ASSESSMENT & PLAN:  85 y.o. male with  1. Lytic bone lesions - w/u consistent with newly diagnosed multiple myeloma Labs upon initial presentation from 04/14/18; hypercalcemic with Calcium at 13.8, renal function up form baseline with Creatinine at 1.48, HGB at 11.3. PSA was normal.   03/31/18 CT C/A/P revealed 1. Lytic lesion throughout all visualized vertebra from the lower cervical spine through the sacrum. Epidural extension of tumor T2, T3 and L2 level. Right L3-4 foraminal extension tumor. 2. Lytic lesions involving majority of ribs. Multiple pathologic rib fractures with bony expansion/sub pleural extension of tumor at several levels. 3. Lytic lesions of the scapula, hips bilaterally and pelvis bilaterally. With further progression of tumor, patient may be at risk for pathologic fractures. Pathologic fracture right clavicle incompletely assessed. 4. Circumferential narrowing sigmoid colon. Question possibility of underlying mass (series 2, image 97). Alternatively this could represent muscular hypertrophy/peristalsis. 5. Gastric antrum circumferential narrowing. Cannot exclude mass although  this may be related to peristalsis. 6. No primary lung malignancy noted. 7. Matted aortic pulmonary window lymph nodes. 8. Circumferential urinary bladder wall thickening greater anterior dome region. Right lateral bladder diverticulum. 9. 5 mm low-density lesion pancreatic body (series 5, image 10) unchanged. This is felt unlikely to be a primary malignancy. 10. Right parapelvic 2.2 cm cyst without  significant change. Scattered low-density renal lesions bilaterally too small to adequately characterize. Some were present previously. Statistically these are likely cysts. 11. Stable adrenal gland hyperplasia. 12. Gynecomastia. 13. Prostate gland causes slight impression upon the bladder base. 14.  Aortic Atherosclerosis. 15. Sequential pacemaker in place. 16. Gallstones.   04/23/18 PET/CT revealed Innumerable hypermetabolic lytic lesions throughout the skeleton especially involving the spine, ribs, bony pelvis along with some involvement of both proximal femurs. The appearance is compatible with pathologic diagnosis of plasma cell neoplasm could well reflect multiple myeloma. Resulting pathologic fractures of the right lateral clavicle and of multiple bilateral ribs. Lesions are present along the cortical margins of the spinal canal. 2. Both the area and the stomach in the area in the sigmoid colon drawn attention to on prior CT scan appear benign normal today. 3. The confluence of AP window lymph nodes measures about 1.2 cm in short axis with maximum SUV 3.1, which is mildly above the blood pool merit surveillance. 4. Other imaging findings of potential clinical significance: Aortic Atherosclerosis. Coronary atherosclerosis. Pacemaker noted. Borderline prostatomegaly. Cholelithiasis.  04/21/18 BM Bx revealed Normocellular bone marrow with Plasma Cell neoplasm. Scattered medium sized clusters of kappa-restricted plasma cells (14% aspirate, 10% CD138 immunohistochemistry.   I do suspect that the burden of disease is underestimated in the bone marrow sample given PET/CT findings of innumerable lytic lesions and an M spike of 3.4g   06/08/18 DG Hips bilat which revealed Innumerable lucencies are noted throughout the pelvis and proximal femurs, similar findings noted on prior PET-CT of 04/23/2018. Findings are consistent patient's known multiple myeloma. Femoral necks are intact. 2.  Aortoiliac atherosclerotic  vascular disease.  2. Pathologic right clavicle fracture due to myeloma- resolved 3. Hypercalcemia- resolved.  PLAN:    -Discussed pt labwork today, 06/02/2020; MMP is at 0.3g/dl, blood counts and chemistries very stable -Discussed last MMP; slight increase in m-protein. Will observe this MMP today and make a decision. -will restart maintenance Daratumumab montly  -Advised pt of q4weeks Daratumumab if slight increase and conservative approach.  -Continue OTC Vitamin D. -Continue Xgeva q8weeks with labs. -Continue stationary bike and physical activity daily.  -Will see back in 8 weeks with labs.   FOLLOW UP: PLz schedule next 4 doses of Lianne Cure  MD visit in 8 weeks Labs with each Delton See treatment   The total time spent in the appointment was 30 minutes and more than 50% was on counseling and direct patient cares.  All of the patient's questions were answered with apparent satisfaction. The patient knows to call the clinic with any problems, questions or concerns.    Sullivan Lone MD [place] [facility]AHIVMS [facility] Hematology/Oncology Physician [facility]  (Office):       [phone] (Work cell):  [phone] (Fax):           [phone]  06/01/2020 2:51 PM  I, Reinaldo Raddle, am acting as scribe for Dr. Sullivan Lone, MD.    .I have reviewed the above documentation for accuracy and completeness, and I agree with the above. Brunetta Genera MD

## 2020-06-02 ENCOUNTER — Inpatient Hospital Stay: Payer: Medicare Other

## 2020-06-02 ENCOUNTER — Inpatient Hospital Stay: Payer: Medicare Other | Attending: Hematology | Admitting: Hematology

## 2020-06-02 ENCOUNTER — Other Ambulatory Visit: Payer: Self-pay

## 2020-06-02 VITALS — BP 182/96 | HR 74 | Temp 97.0°F | Resp 18 | Ht 71.0 in | Wt 216.5 lb

## 2020-06-02 DIAGNOSIS — I10 Essential (primary) hypertension: Secondary | ICD-10-CM | POA: Insufficient documentation

## 2020-06-02 DIAGNOSIS — N400 Enlarged prostate without lower urinary tract symptoms: Secondary | ICD-10-CM | POA: Diagnosis not present

## 2020-06-02 DIAGNOSIS — Z7982 Long term (current) use of aspirin: Secondary | ICD-10-CM | POA: Insufficient documentation

## 2020-06-02 DIAGNOSIS — C9001 Multiple myeloma in remission: Secondary | ICD-10-CM | POA: Diagnosis not present

## 2020-06-02 DIAGNOSIS — Z79899 Other long term (current) drug therapy: Secondary | ICD-10-CM | POA: Diagnosis not present

## 2020-06-02 DIAGNOSIS — C7951 Secondary malignant neoplasm of bone: Secondary | ICD-10-CM

## 2020-06-02 DIAGNOSIS — Z7189 Other specified counseling: Secondary | ICD-10-CM

## 2020-06-02 DIAGNOSIS — Z8041 Family history of malignant neoplasm of ovary: Secondary | ICD-10-CM | POA: Diagnosis not present

## 2020-06-02 DIAGNOSIS — C9000 Multiple myeloma not having achieved remission: Secondary | ICD-10-CM

## 2020-06-02 DIAGNOSIS — Z5112 Encounter for antineoplastic immunotherapy: Secondary | ICD-10-CM

## 2020-06-02 DIAGNOSIS — Z833 Family history of diabetes mellitus: Secondary | ICD-10-CM | POA: Insufficient documentation

## 2020-06-02 DIAGNOSIS — Z87891 Personal history of nicotine dependence: Secondary | ICD-10-CM | POA: Insufficient documentation

## 2020-06-02 DIAGNOSIS — D508 Other iron deficiency anemias: Secondary | ICD-10-CM | POA: Diagnosis not present

## 2020-06-02 DIAGNOSIS — Z8249 Family history of ischemic heart disease and other diseases of the circulatory system: Secondary | ICD-10-CM | POA: Diagnosis not present

## 2020-06-02 DIAGNOSIS — Z803 Family history of malignant neoplasm of breast: Secondary | ICD-10-CM | POA: Insufficient documentation

## 2020-06-02 LAB — CBC WITH DIFFERENTIAL/PLATELET
Abs Immature Granulocytes: 0.01 10*3/uL (ref 0.00–0.07)
Basophils Absolute: 0.1 10*3/uL (ref 0.0–0.1)
Basophils Relative: 1 %
Eosinophils Absolute: 0.3 10*3/uL (ref 0.0–0.5)
Eosinophils Relative: 6 %
HCT: 38.4 % — ABNORMAL LOW (ref 39.0–52.0)
Hemoglobin: 12.3 g/dL — ABNORMAL LOW (ref 13.0–17.0)
Immature Granulocytes: 0 %
Lymphocytes Relative: 46 %
Lymphs Abs: 2.4 10*3/uL (ref 0.7–4.0)
MCH: 27.7 pg (ref 26.0–34.0)
MCHC: 32 g/dL (ref 30.0–36.0)
MCV: 86.5 fL (ref 80.0–100.0)
Monocytes Absolute: 0.5 10*3/uL (ref 0.1–1.0)
Monocytes Relative: 10 %
Neutro Abs: 1.9 10*3/uL (ref 1.7–7.7)
Neutrophils Relative %: 37 %
Platelets: 158 10*3/uL (ref 150–400)
RBC: 4.44 MIL/uL (ref 4.22–5.81)
RDW: 14.4 % (ref 11.5–15.5)
WBC: 5.1 10*3/uL (ref 4.0–10.5)
nRBC: 0 % (ref 0.0–0.2)

## 2020-06-02 LAB — CMP (CANCER CENTER ONLY)
ALT: 9 U/L (ref 0–44)
AST: 16 U/L (ref 15–41)
Albumin: 3.6 g/dL (ref 3.5–5.0)
Alkaline Phosphatase: 70 U/L (ref 38–126)
Anion gap: 8 (ref 5–15)
BUN: 14 mg/dL (ref 8–23)
CO2: 25 mmol/L (ref 22–32)
Calcium: 8.8 mg/dL — ABNORMAL LOW (ref 8.9–10.3)
Chloride: 108 mmol/L (ref 98–111)
Creatinine: 1.12 mg/dL (ref 0.61–1.24)
GFR, Estimated: >60 mL/min (ref >60)
Glucose, Bld: 86 mg/dL (ref 70–99)
Potassium: 4.1 mmol/L (ref 3.5–5.1)
Sodium: 141 mmol/L (ref 135–145)
Total Bilirubin: 0.5 mg/dL (ref 0.3–1.2)
Total Protein: 7.3 g/dL (ref 6.5–8.1)

## 2020-06-02 MED ORDER — DENOSUMAB 120 MG/1.7ML ~~LOC~~ SOLN
SUBCUTANEOUS | Status: AC
  Filled 2020-06-02: qty 1.7

## 2020-06-02 MED ORDER — DENOSUMAB 120 MG/1.7ML ~~LOC~~ SOLN
120.0000 mg | Freq: Once | SUBCUTANEOUS | Status: AC
  Administered 2020-06-02: 120 mg via SUBCUTANEOUS

## 2020-06-02 NOTE — Patient Instructions (Signed)
Denosumab injection °What is this medicine? °DENOSUMAB (den oh sue mab) slows bone breakdown. Prolia is used to treat osteoporosis in women after menopause and in men, and in people who are taking corticosteroids for 6 months or more. Xgeva is used to treat a high calcium level due to cancer and to prevent bone fractures and other bone problems caused by multiple myeloma or cancer bone metastases. Xgeva is also used to treat giant cell tumor of the bone. °This medicine may be used for other purposes; ask your health care provider or pharmacist if you have questions. °COMMON BRAND NAME(S): Prolia, XGEVA °What should I tell my health care provider before I take this medicine? °They need to know if you have any of these conditions: °· dental disease °· having surgery or tooth extraction °· infection °· kidney disease °· low levels of calcium or Vitamin D in the blood °· malnutrition °· on hemodialysis °· skin conditions or sensitivity °· thyroid or parathyroid disease °· an unusual reaction to denosumab, other medicines, foods, dyes, or preservatives °· pregnant or trying to get pregnant °· breast-feeding °How should I use this medicine? °This medicine is for injection under the skin. It is given by a health care professional in a hospital or clinic setting. °A special MedGuide will be given to you before each treatment. Be sure to read this information carefully each time. °For Prolia, talk to your pediatrician regarding the use of this medicine in children. Special care may be needed. For Xgeva, talk to your pediatrician regarding the use of this medicine in children. While this drug may be prescribed for children as young as 13 years for selected conditions, precautions do apply. °Overdosage: If you think you have taken too much of this medicine contact a poison control center or emergency room at once. °NOTE: This medicine is only for you. Do not share this medicine with others. °What if I miss a dose? °It is  important not to miss your dose. Call your doctor or health care professional if you are unable to keep an appointment. °What may interact with this medicine? °Do not take this medicine with any of the following medications: °· other medicines containing denosumab °This medicine may also interact with the following medications: °· medicines that lower your chance of fighting infection °· steroid medicines like prednisone or cortisone °This list may not describe all possible interactions. Give your health care provider a list of all the medicines, herbs, non-prescription drugs, or dietary supplements you use. Also tell them if you smoke, drink alcohol, or use illegal drugs. Some items may interact with your medicine. °What should I watch for while using this medicine? °Visit your doctor or health care professional for regular checks on your progress. Your doctor or health care professional may order blood tests and other tests to see how you are doing. °Call your doctor or health care professional for advice if you get a fever, chills or sore throat, or other symptoms of a cold or flu. Do not treat yourself. This drug may decrease your body's ability to fight infection. Try to avoid being around people who are sick. °You should make sure you get enough calcium and vitamin D while you are taking this medicine, unless your doctor tells you not to. Discuss the foods you eat and the vitamins you take with your health care professional. °See your dentist regularly. Brush and floss your teeth as directed. Before you have any dental work done, tell your dentist you are   receiving this medicine. Do not become pregnant while taking this medicine or for 5 months after stopping it. Talk with your doctor or health care professional about your birth control options while taking this medicine. Women should inform their doctor if they wish to become pregnant or think they might be pregnant. There is a potential for serious side  effects to an unborn child. Talk to your health care professional or pharmacist for more information. What side effects may I notice from receiving this medicine? Side effects that you should report to your doctor or health care professional as soon as possible:  allergic reactions like skin rash, itching or hives, swelling of the face, lips, or tongue  bone pain  breathing problems  dizziness  jaw pain, especially after dental work  redness, blistering, peeling of the skin  signs and symptoms of infection like fever or chills; cough; sore throat; pain or trouble passing urine  signs of low calcium like fast heartbeat, muscle cramps or muscle pain; pain, tingling, numbness in the hands or feet; seizures  unusual bleeding or bruising  unusually weak or tired Side effects that usually do not require medical attention (report to your doctor or health care professional if they continue or are bothersome):  constipation  diarrhea  headache  joint pain  loss of appetite  muscle pain  runny nose  tiredness  upset stomach This list may not describe all possible side effects. Call your doctor for medical advice about side effects. You may report side effects to FDA at 1-800-FDA-1088. Where should I keep my medicine? This medicine is only given in a clinic, doctor's office, or other health care setting and will not be stored at home. NOTE: This sheet is a summary. It may not cover all possible information. If you have questions about this medicine, talk to your doctor, pharmacist, or health care provider.  2021 Elsevier/Gold Standard (2017-09-05 16:10:44)

## 2020-06-06 LAB — MULTIPLE MYELOMA PANEL, SERUM
Albumin SerPl Elph-Mcnc: 3.6 g/dL (ref 2.9–4.4)
Albumin/Glob SerPl: 1.1 (ref 0.7–1.7)
Alpha 1: 0.2 g/dL (ref 0.0–0.4)
Alpha2 Glob SerPl Elph-Mcnc: 0.8 g/dL (ref 0.4–1.0)
B-Globulin SerPl Elph-Mcnc: 1.2 g/dL (ref 0.7–1.3)
Gamma Glob SerPl Elph-Mcnc: 1.1 g/dL (ref 0.4–1.8)
Globulin, Total: 3.3 g/dL (ref 2.2–3.9)
IgA: 526 mg/dL — ABNORMAL HIGH (ref 61–437)
IgG (Immunoglobin G), Serum: 789 mg/dL (ref 603–1613)
IgM (Immunoglobulin M), Srm: 48 mg/dL (ref 15–143)
M Protein SerPl Elph-Mcnc: 0.3 g/dL — ABNORMAL HIGH
Total Protein ELP: 6.9 g/dL (ref 6.0–8.5)

## 2020-06-08 MED ORDER — ACYCLOVIR 400 MG PO TABS: 400.0000 mg | ORAL_TABLET | Freq: Two times a day (BID) | ORAL | 11 refills | Status: DC

## 2020-06-08 MED ORDER — PROCHLORPERAZINE MALEATE 10 MG PO TABS: 10.0000 mg | ORAL_TABLET | Freq: Four times a day (QID) | ORAL | 1 refills | Status: DC | PRN

## 2020-06-08 MED ORDER — ONDANSETRON HCL 8 MG PO TABS: 8.0000 mg | ORAL_TABLET | Freq: Two times a day (BID) | ORAL | 1 refills | Status: DC | PRN

## 2020-06-08 NOTE — Progress Notes (Signed)
ON PATHWAY REGIMEN - Multiple Myeloma and Other Plasma Cell Dyscrasias  No Change  Continue With Treatment as Ordered.  Original Decision Date/Time: 04/29/2018 00:34     Cycle 1 and 2: A cycle is every 28 days:     Lenalidomide      Dexamethasone      Daratumumab    Cycles 3 through 6: A cycle is every 28 days:     Lenalidomide      Dexamethasone      Daratumumab    Cycles 7 and beyond: A cycle is every 28 days:     Lenalidomide      Dexamethasone      Daratumumab   **Always confirm dose/schedule in your pharmacy ordering system**  Patient Characteristics: Newly Diagnosed, Transplant Ineligible or Refused, Unknown or Awaiting Test Results R-ISS Staging: II Disease Classification: Newly Diagnosed Is Patient Eligible for Transplant<= Transplant Ineligible or Refused Risk Status: Unknown Intent of Therapy: Non-Curative / Palliative Intent, Discussed with Patient

## 2020-06-13 DIAGNOSIS — B351 Tinea unguium: Secondary | ICD-10-CM | POA: Diagnosis not present

## 2020-06-13 DIAGNOSIS — M79675 Pain in left toe(s): Secondary | ICD-10-CM | POA: Diagnosis not present

## 2020-06-13 DIAGNOSIS — M79674 Pain in right toe(s): Secondary | ICD-10-CM | POA: Diagnosis not present

## 2020-06-16 ENCOUNTER — Telehealth: Payer: Self-pay | Admitting: Hematology

## 2020-06-16 NOTE — Telephone Encounter (Signed)
Called pt per 1/27 sch msg - left message for daughter with appt date and time

## 2020-06-22 ENCOUNTER — Inpatient Hospital Stay: Payer: Medicare Other

## 2020-06-27 ENCOUNTER — Inpatient Hospital Stay: Payer: Medicare Other

## 2020-06-27 ENCOUNTER — Inpatient Hospital Stay: Payer: Medicare Other | Attending: Hematology

## 2020-06-27 ENCOUNTER — Other Ambulatory Visit: Payer: Self-pay

## 2020-06-27 VITALS — BP 171/96 | HR 64 | Temp 98.2°F | Resp 18

## 2020-06-27 DIAGNOSIS — Z79899 Other long term (current) drug therapy: Secondary | ICD-10-CM | POA: Diagnosis not present

## 2020-06-27 DIAGNOSIS — C9001 Multiple myeloma in remission: Secondary | ICD-10-CM

## 2020-06-27 DIAGNOSIS — C9000 Multiple myeloma not having achieved remission: Secondary | ICD-10-CM | POA: Insufficient documentation

## 2020-06-27 DIAGNOSIS — Z5112 Encounter for antineoplastic immunotherapy: Secondary | ICD-10-CM

## 2020-06-27 DIAGNOSIS — Z7189 Other specified counseling: Secondary | ICD-10-CM

## 2020-06-27 LAB — CMP (CANCER CENTER ONLY)
ALT: 8 U/L (ref 0–44)
AST: 16 U/L (ref 15–41)
Albumin: 3.6 g/dL (ref 3.5–5.0)
Alkaline Phosphatase: 67 U/L (ref 38–126)
Anion gap: 8 (ref 5–15)
BUN: 15 mg/dL (ref 8–23)
CO2: 26 mmol/L (ref 22–32)
Calcium: 9.1 mg/dL (ref 8.9–10.3)
Chloride: 104 mmol/L (ref 98–111)
Creatinine: 1.26 mg/dL — ABNORMAL HIGH (ref 0.61–1.24)
GFR, Estimated: 57 mL/min — ABNORMAL LOW (ref >60)
Glucose, Bld: 96 mg/dL (ref 70–99)
Potassium: 4 mmol/L (ref 3.5–5.1)
Sodium: 138 mmol/L (ref 135–145)
Total Bilirubin: 0.7 mg/dL (ref 0.3–1.2)
Total Protein: 7.1 g/dL (ref 6.5–8.1)

## 2020-06-27 LAB — CBC WITH DIFFERENTIAL/PLATELET
Abs Immature Granulocytes: 0.01 10*3/uL (ref 0.00–0.07)
Basophils Absolute: 0.1 10*3/uL (ref 0.0–0.1)
Basophils Relative: 1 %
Eosinophils Absolute: 0.5 10*3/uL (ref 0.0–0.5)
Eosinophils Relative: 8 %
HCT: 38.1 % — ABNORMAL LOW (ref 39.0–52.0)
Hemoglobin: 12.6 g/dL — ABNORMAL LOW (ref 13.0–17.0)
Immature Granulocytes: 0 %
Lymphocytes Relative: 46 %
Lymphs Abs: 2.7 10*3/uL (ref 0.7–4.0)
MCH: 28.1 pg (ref 26.0–34.0)
MCHC: 33.1 g/dL (ref 30.0–36.0)
MCV: 84.9 fL (ref 80.0–100.0)
Monocytes Absolute: 0.6 10*3/uL (ref 0.1–1.0)
Monocytes Relative: 11 %
Neutro Abs: 2 10*3/uL (ref 1.7–7.7)
Neutrophils Relative %: 34 %
Platelets: 153 10*3/uL (ref 150–400)
RBC: 4.49 MIL/uL (ref 4.22–5.81)
RDW: 14 % (ref 11.5–15.5)
WBC: 5.9 10*3/uL (ref 4.0–10.5)
nRBC: 0 % (ref 0.0–0.2)

## 2020-06-27 MED ORDER — ACETAMINOPHEN 325 MG PO TABS
650.0000 mg | ORAL_TABLET | Freq: Once | ORAL | Status: AC
  Administered 2020-06-27: 650 mg via ORAL

## 2020-06-27 MED ORDER — SODIUM CHLORIDE 0.9 % IV SOLN
16.0000 mg/kg | Freq: Once | INTRAVENOUS | Status: AC
  Administered 2020-06-27: 1600 mg via INTRAVENOUS
  Filled 2020-06-27: qty 80

## 2020-06-27 MED ORDER — MONTELUKAST SODIUM 10 MG PO TABS
ORAL_TABLET | ORAL | Status: AC
  Filled 2020-06-27: qty 1

## 2020-06-27 MED ORDER — DIPHENHYDRAMINE HCL 25 MG PO CAPS
ORAL_CAPSULE | ORAL | Status: AC
  Filled 2020-06-27: qty 2

## 2020-06-27 MED ORDER — SODIUM CHLORIDE 0.9 % IV SOLN
Freq: Once | INTRAVENOUS | Status: AC
Start: 2020-06-27 — End: 2020-06-27
  Filled 2020-06-27: qty 250

## 2020-06-27 MED ORDER — ACETAMINOPHEN 325 MG PO TABS
ORAL_TABLET | ORAL | Status: AC
  Filled 2020-06-27: qty 2

## 2020-06-27 MED ORDER — FAMOTIDINE IN NACL 20-0.9 MG/50ML-% IV SOLN
20.0000 mg | Freq: Once | INTRAVENOUS | Status: AC
  Administered 2020-06-27: 20 mg via INTRAVENOUS

## 2020-06-27 MED ORDER — MONTELUKAST SODIUM 10 MG PO TABS
10.0000 mg | ORAL_TABLET | Freq: Once | ORAL | Status: AC
  Administered 2020-06-27: 10 mg via ORAL

## 2020-06-27 MED ORDER — SODIUM CHLORIDE 0.9 % IV SOLN
20.0000 mg | Freq: Once | INTRAVENOUS | Status: AC
  Administered 2020-06-27: 20 mg via INTRAVENOUS
  Filled 2020-06-27: qty 20

## 2020-06-27 MED ORDER — DIPHENHYDRAMINE HCL 25 MG PO CAPS
50.0000 mg | ORAL_CAPSULE | Freq: Once | ORAL | Status: AC
  Administered 2020-06-27: 50 mg via ORAL

## 2020-06-27 MED ORDER — FAMOTIDINE IN NACL 20-0.9 MG/50ML-% IV SOLN
INTRAVENOUS | Status: AC
  Filled 2020-06-27: qty 50

## 2020-06-27 NOTE — Patient Instructions (Signed)
[facility] Discharge Instructions for Patients Receiving Chemotherapy  Today you received the following chemotherapy agents: darzalex  To help prevent nausea and vomiting after your treatment, we encourage you to take your nausea medication as directed    If you develop nausea and vomiting that is not controlled by your nausea medication, call the clinic.   BELOW ARE SYMPTOMS THAT SHOULD BE REPORTED IMMEDIATELY:  *FEVER GREATER THAN 100.5 F  *CHILLS WITH OR WITHOUT FEVER  NAUSEA AND VOMITING THAT IS NOT CONTROLLED WITH YOUR NAUSEA MEDICATION  *UNUSUAL SHORTNESS OF BREATH  *UNUSUAL BRUISING OR BLEEDING  TENDERNESS IN MOUTH AND THROAT WITH OR WITHOUT PRESENCE OF ULCERS  *URINARY PROBLEMS  *BOWEL PROBLEMS  UNUSUAL RASH Items with * indicate a potential emergency and should be followed up as soon as possible.  Feel free to call the clinic should you have any questions or concerns. The clinic phone number is [phone].  Please show the CHEMO ALERT CARD at check-in to the Emergency Department and triage nurse.   

## 2020-06-30 LAB — MULTIPLE MYELOMA PANEL, SERUM
Albumin SerPl Elph-Mcnc: 3.5 g/dL (ref 2.9–4.4)
Albumin/Glob SerPl: 1.1 (ref 0.7–1.7)
Alpha 1: 0.2 g/dL (ref 0.0–0.4)
Alpha2 Glob SerPl Elph-Mcnc: 0.8 g/dL (ref 0.4–1.0)
B-Globulin SerPl Elph-Mcnc: 1.2 g/dL (ref 0.7–1.3)
Gamma Glob SerPl Elph-Mcnc: 1 g/dL (ref 0.4–1.8)
Globulin, Total: 3.3 g/dL (ref 2.2–3.9)
IgA: 569 mg/dL — ABNORMAL HIGH (ref 61–437)
IgG (Immunoglobin G), Serum: 759 mg/dL (ref 603–1613)
IgM (Immunoglobulin M), Srm: 45 mg/dL (ref 15–143)
M Protein SerPl Elph-Mcnc: 0.2 g/dL — ABNORMAL HIGH
Total Protein ELP: 6.8 g/dL (ref 6.0–8.5)

## 2020-07-05 ENCOUNTER — Ambulatory Visit (INDEPENDENT_AMBULATORY_CARE_PROVIDER_SITE_OTHER): Payer: Medicare Other

## 2020-07-05 DIAGNOSIS — R001 Bradycardia, unspecified: Secondary | ICD-10-CM | POA: Diagnosis not present

## 2020-07-06 LAB — CUP PACEART REMOTE DEVICE CHECK
Battery Impedance: 1035 Ohm
Battery Remaining Longevity: 61 mo
Battery Voltage: 2.75 V
Brady Statistic AP VP Percent: 25 %
Brady Statistic AP VS Percent: 0 %
Brady Statistic AS VP Percent: 61 %
Brady Statistic AS VS Percent: 14 %
Date Time Interrogation Session: 20220223101949
Implantable Lead Implant Date: 20050121
Implantable Lead Implant Date: 20050121
Implantable Lead Location: 753859
Implantable Lead Location: 753860
Implantable Lead Model: 5076
Implantable Lead Model: 5076
Implantable Pulse Generator Implant Date: 20140207
Lead Channel Impedance Value: 550 Ohm
Lead Channel Impedance Value: 628 Ohm
Lead Channel Pacing Threshold Amplitude: 0.375 V
Lead Channel Pacing Threshold Amplitude: 1.125 V
Lead Channel Pacing Threshold Pulse Width: 0.4 ms
Lead Channel Pacing Threshold Pulse Width: 0.4 ms
Lead Channel Setting Pacing Amplitude: 2.5 V
Lead Channel Setting Pacing Amplitude: 2.5 V
Lead Channel Setting Pacing Pulse Width: 0.4 ms
Lead Channel Setting Sensing Sensitivity: 2 mV

## 2020-07-13 NOTE — Progress Notes (Signed)
Remote pacemaker transmission.   

## 2020-07-24 NOTE — Progress Notes (Incomplete)
HEMATOLOGY/ONCOLOGY CLINIC NOTE  Date of Service: 07/24/2020  Patient Care Team: [name], MD as PCP - General (Family Medicine) [name], MD (Cardiology) [name], MD (Cardiology) [name], DO as Consulting Physician (Neurology)  CHIEF COMPLAINTS/PURPOSE OF CONSULTATION:  Continue mx of myeloma  HISTORY OF PRESENTING ILLNESS:   Jonathon [name] is a wonderful 85 y.o. male who has been referred to [place] by Dr. [name] for evaluation and management of Lytic bone lesions. He is accompanied today by his daughter. The pt reports that he is doing well overall. He is currently a resident at [facility] in [place], [place].   The pt presented to the ED on 03/24/18 with a right distal clavicle fracture, which occurred after trying to get out of bed and pushing himself up with his right arm. He notes that his energy has been noticeably decreased for the last month. He also notes that his lower back has a slight sting when trying to stand up from sitting. He also notes some pain in his lower, right chest wall, which is exacerbated when coughing. The pt has been taking 61m Tramadol which is controlling his pain. He does endorse some weakening of both of his lower extremities in the past 1-2 months, which he attributes somewhat to his recent lower back pain.   The pt denies any bowel abnormalities recently. He notes that he stays very well hydrated, and urinates 4-5 times a night, and does have BPH, and takes Flomax. The pt denies any recent changes in his urination habits.   The pt notes that his appetite decreased for a few months, until he began assisted living. He has lost 9 pounds in the last week.   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 He now uses a walker to ambulate. He notes that the CIDP symptoms are primarily located to his legs.  He sees Dr. [name] Neurology. He took steroids without any symptomatic relief. IVIG was cost-prohibitive for the patient, but was recommended.    Regarding the patient's anemia in April 2019, he denies any obvious bleeding at that time, and denies surgeries.    The pt notes that his last colonoscopy, in 2015, was not concerning, which is corroborated by chart review.   The pt and daughter deny any concerns for memory issues, and the daughter adds his memory is "elephant-like". They are not sure why Namenda is on his medication list.   The pt denies any recent dental procedures, or concerns for dental issues. He has maintained every 6 month dental cleanings for all of his life.   Of note prior to the patient's visit today, pt has had a CT C/A/P completed on 03/31/18 with results revealing IMPRESSION: 1. Lytic lesion throughout all visualized vertebra from the lower cervical spine through the sacrum. Epidural extension of tumor T2, T3 and L2 level. Right L3-4 foraminal extension tumor. 2. Lytic lesions involving majority of ribs. Multiple pathologic rib fractures with bony expansion/sub pleural extension of tumor at several levels. 3. Lytic lesions of the scapula, hips bilaterally and pelvis bilaterally. With further progression of tumor, patient may be at risk for pathologic fractures. Pathologic fracture right clavicle incompletely assessed. 4. Circumferential narrowing sigmoid colon. Question possibility of underlying mass (series 2, image 97). Alternatively this could represent muscular hypertrophy/peristalsis. 5. Gastric  antrum circumferential narrowing. Cannot exclude mass although this may be related to peristalsis. 6. No primary lung malignancy noted. 7. Matted aortic pulmonary window lymph nodes. 8. Circumferential urinary bladder wall thickening greater anterior dome region. Right lateral bladder diverticulum. 9. 5 mm low-density lesion pancreatic body (series 5, image 10) unchanged.  This is felt unlikely to be a primary malignancy. 10. Right parapelvic 2.2 cm cyst without significant change. Scattered low-density renal lesions bila2terally too small to adequately characterize. Some were present previously. Statistically these are likely cysts. 11. Stable adrenal gland hyperplasia. 12. Gynecomastia. 13. Prostate gland causes slight impression upon the bladder base. 14.  Aortic Atherosclerosis. 15. Sequential pacemaker in place. 16. Gallstones.  Most recent lab results (03/25/18) of CBC w/diff and CMP is as follows: all values are WNL except for RBC at 4.14, HGB at 11.3, HCT at 33.6, Chloride at 94, CO2 at 33, Glucose at 100, BUN at 25, Total Protein at 9.4, Albumin at 3.4, Calcium at 13.8, GFR at 58. 03/25/18 PSA, Total was normal at 1.2  On review of systems, pt reports positional lower back pain, lower right chest wall pain, recently decreased energy levels, relatively weaker appetite, weight loss, right clavicle pain, weakening of the lower extremities, moving his bowels well, and denies changes in urination, abdominal pains, changes in breathing, changes in bowel habits, pain along the spine, leg swelling, and any other symptoms.  Interval History:  Jonathon [name] returns today for management and evaluation of his Multiple Myeloma. We are joined today by his daughter.***  The patient's last visit with [place] was on 06/02/2020. The pt reports that he is doing well overall.  The pt reports ***  Lab results today 07/25/2020 of CBC w/diff and CMP is as follows: all values are WNL except for ***  On review of systems, pt reports *** and denies *** and any other symptoms.  MEDICAL HISTORY:  Past Medical History:  Diagnosis Date  . Asthma   . BENIGN PROSTATIC HYPERTROPHY, HX OF 10/25/2008  . Bone metastases ([place]) 04/14/2018  . BRADYCARDIA 2005  . Chronic diastolic congestive heart failure ([place]) 06/05/2016  . CIDP (chronic inflammatory demyelinating polyneuropathy) ([place])   .  HYPERTENSION 11/21/2006  . Iron deficiency anemia, unspecified 04/19/2013  . Multiple myeloma ([place]) 04/29/2018  . 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 PACEMAKER, PERMANENT 2005   Gen change 2014 Medtronic Adaptic L dual-chamber pacemaker, serial [id number]   . SVT (supraventricular tachycardia) ([place])   . TOBACCO ABUSE 10/25/2008   Quit 2012    SURGICAL HISTORY: Past Surgical History:  Procedure Laterality Date  . COLONOSCOPY  [id number]  . PACEMAKER INSERTION  2005  . PERMANENT PACEMAKER GENERATOR CHANGE N/A 06/19/2012   Procedure: PERMANENT PACEMAKER GENERATOR CHANGE;  Surgeon: [name], MD; Medtronic Adaptic L dual-chamber pacemaker, serial #[id number]    . SKIN GRAFT Right 1962   wrist    SOCIAL HISTORY: Social History   Socioeconomic History  . Marital status: Divorced    Spouse name: Not on file  . Number of children: 4  . Years of education: Not on file  . Highest education level: Not on file  Occupational History  . Occupation: ENVIROMENTAL Location manager: [employer]  . Occupation: retired  Tobacco Use  . Smoking status: Former Smoker    Packs/day: 0.25    Years: 46.00    Pack years: 11.50    Types: Cigarettes    Start date: 03/16/1965  Quit date: 06/15/2010    Years since quitting: 10.1  . Smokeless tobacco: Never Used  . Tobacco comment: quit 3 years ago  Vaping Use  . Vaping Use: Never used  Substance and Sexual Activity  . Alcohol use: No    Alcohol/week: 0.0 standard drinks  . Drug use: No  . Sexual activity: Not on file  Other Topics Concern  . Not on file  Social History Narrative   Has relocated from [place] in 2007. 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  Patient in a facility thru [facility]   Social Determinants of Health   Financial Resource Strain: Not on file  Food Insecurity: Not on file  Transportation Needs: Not on file  Physical Activity: Not on file  Stress: Not on file  Social Connections: Not on file  Intimate  Partner Violence: Not on file    FAMILY HISTORY: Family History  Problem Relation Age of Onset  . Heart attack Father        Died, 16  . Kidney disease Father   . Parkinson's disease Mother        Died, 73  . Breast cancer Sister        Living, 41  . Diabetes type II Brother        Living, 46  . Breast cancer Sister        Living, 69  . Diabetes Daughter        Living, 14  . Diabetes Son        Living, 48  . Ovarian cancer Daughter   . Colon cancer Neg Hx   . Rectal cancer Neg Hx   . Stomach cancer Neg Hx     ALLERGIES:  is allergic to lexapro [escitalopram oxalate].  MEDICATIONS:  Current Outpatient Medications  Medication Sig Dispense Refill  . acyclovir (ZOVIRAX) 400 MG tablet Take 1 tablet (400 mg total) by mouth 2 (two) times daily. 60 tablet 11  . aspirin EC 81 MG tablet Take 81 mg by mouth daily.    Marland Kitchen diltiazem (TIAZAC) 240 MG 24 hr capsule Take 1 capsule by mouth daily.    . flecainide (TAMBOCOR) 50 MG tablet Take 75 mg by mouth daily as needed.    . furosemide (LASIX) 40 MG tablet Take 40 mg by mouth daily.    . irbesartan (AVAPRO) 300 MG tablet Take 300 mg by mouth daily.    . memantine (NAMENDA) 5 MG tablet Take 5 mg by mouth 2 (two) times daily.    . metoprolol tartrate (LOPRESSOR) 50 MG tablet Take 1 tablet (50 mg total) by mouth 2 (two) times daily. 60 tablet 0  . ondansetron (ZOFRAN) 8 MG tablet Take 1 tablet (8 mg total) by mouth 2 (two) times daily as needed (Nausea or vomiting). 30 tablet 1  . polyethylene glycol (MIRALAX / GLYCOLAX) packet Take 17 g by mouth daily. 14 each 0  . prochlorperazine (COMPAZINE) 10 MG tablet Take 1 tablet (10 mg total) by mouth every 6 (six) hours as needed (Nausea or vomiting). 30 tablet 1  . Saccharomyces boulardii (FLORASTOR PO) Take 250 mg by mouth 2 (two) times daily.    . tamsulosin (FLOMAX) 0.4 MG CAPS capsule Take 2 capsules (0.8 mg total) by mouth daily. 90 capsule 0   No current facility-administered medications  for this visit.   Facility-Administered Medications Ordered in Other Visits  Medication Dose Route Frequency Provider Last Rate Last Admin  . sodium chloride flush (NS) 0.9 % injection 10 mL  10  mL Intracatheter PRN Brunetta Genera, MD        REVIEW OF SYSTEMS:   10 Point review of Systems was done is negative except as noted above.  PHYSICAL EXAMINATION: ECOG PERFORMANCE STATUS: 2 - Symptomatic, <50% confined to bed  .There were no vitals taken for this visit.  *** GENERAL:alert, in no acute distress and comfortable SKIN: no acute rashes, no significant lesions EYES: conjunctiva are pink and non-injected, sclera anicteric OROPHARYNX: MMM, no exudates, no oropharyngeal erythema or ulceration NECK: supple, no JVD LYMPH:  no palpable lymphadenopathy in the cervical, axillary or inguinal regions LUNGS: clear to auscultation b/l with normal respiratory effort HEART: regular rate & rhythm ABDOMEN:  normoactive bowel sounds , non tender, not distended. Extremity: no pedal edema PSYCH: alert & oriented x 3 with fluent speech NEURO: no focal motor/sensory deficits   LABORATORY DATA:  I have reviewed the data as listed  . CBC Latest Ref Rng & Units 06/27/2020 06/02/2020 03/31/2020  WBC 4.0 - 10.5 K/uL 5.9 5.1 6.6  Hemoglobin 13.0 - 17.0 g/dL 12.6(L) 12.3(L) 12.4(L)  Hematocrit 39.0 - 52.0 % 38.1(L) 38.4(L) 39.2  Platelets 150 - 400 K/uL 153 158 162    . CMP Latest Ref Rng & Units 06/27/2020 06/02/2020 03/31/2020  Glucose 70 - 99 mg/dL 96 86 91  BUN 8 - 23 mg/dL '15 14 13  ' Creatinine 0.61 - 1.24 mg/dL 1.26(H) 1.12 1.13  Sodium 135 - 145 mmol/L 138 141 139  Potassium 3.5 - 5.1 mmol/L 4.0 4.1 4.5  Chloride 98 - 111 mmol/L 104 108 103  CO2 22 - 32 mmol/L '26 25 31  ' Calcium 8.9 - 10.3 mg/dL 9.1 8.8(L) 9.4  Total Protein 6.5 - 8.1 g/dL 7.1 7.3 7.1  Total Bilirubin 0.3 - 1.2 mg/dL 0.7 0.5 0.6  Alkaline Phos 38 - 126 U/L 67 70 70  AST 15 - 41 U/L '16 16 18  ' ALT 0 - 44 U/L '8 9 7    ' 12/14/2018 MMP    12/14/2018 MMP    04/17/18 BM Bx:    RADIOGRAPHIC STUDIES: I have personally reviewed the radiological images as listed and agreed with the findings in the report. CUP PACEART REMOTE DEVICE CHECK  Result Date: 07/06/2020 Scheduled remote reviewed. Normal device function.  Next remote 91 days. Kathy Breach, RN, CCDS, [employer]   ASSESSMENT & PLAN:  85 y.o. male with  1. Lytic bone lesions - w/u consistent with newly diagnosed multiple myeloma Labs upon initial presentation from 04/14/18; hypercalcemic with Calcium at 13.8, renal function up form baseline with Creatinine at 1.48, HGB at 11.3. PSA was normal.   03/31/18 CT C/A/P revealed 1. Lytic lesion throughout all visualized vertebra from the lower cervical spine through the sacrum. Epidural extension of tumor T2, T3 and L2 level. Right L3-4 foraminal extension tumor. 2. Lytic lesions involving majority of ribs. Multiple pathologic rib fractures with bony expansion/sub pleural extension of tumor at several levels. 3. Lytic lesions of the scapula, hips bilaterally and pelvis bilaterally. With further progression of tumor, patient may be at risk for pathologic fractures. Pathologic fracture right clavicle incompletely assessed. 4. Circumferential narrowing sigmoid colon. Question possibility of underlying mass (series 2, image 97). Alternatively this could represent muscular hypertrophy/peristalsis. 5. Gastric antrum circumferential narrowing. Cannot exclude mass although this may be related to peristalsis. 6. No primary lung malignancy noted. 7. Matted aortic pulmonary window lymph nodes. 8. Circumferential urinary bladder wall thickening greater anterior dome region. Right lateral bladder diverticulum.  9. 5 mm low-density lesion pancreatic body (series 5, image 10) unchanged. This is felt unlikely to be a primary malignancy. 10. Right parapelvic 2.2 cm cyst without significant change. Scattered low-density renal  lesions bilaterally too small to adequately characterize. Some were present previously. Statistically these are likely cysts. 11. Stable adrenal gland hyperplasia. 12. Gynecomastia. 13. Prostate gland causes slight impression upon the bladder base. 14.  Aortic Atherosclerosis. 15. Sequential pacemaker in place. 16. Gallstones.   04/23/18 PET/CT revealed Innumerable hypermetabolic lytic lesions throughout the skeleton especially involving the spine, ribs, bony pelvis along with some involvement of both proximal femurs. The appearance is compatible with pathologic diagnosis of plasma cell neoplasm could well reflect multiple myeloma. Resulting pathologic fractures of the right lateral clavicle and of multiple bilateral ribs. Lesions are present along the cortical margins of the spinal canal. 2. Both the area and the stomach in the area in the sigmoid colon drawn attention to on prior CT scan appear benign normal today. 3. The confluence of AP window lymph nodes measures about 1.2 cm in short axis with maximum SUV 3.1, which is mildly above the blood pool merit surveillance. 4. Other imaging findings of potential clinical significance: Aortic Atherosclerosis. Coronary atherosclerosis. Pacemaker noted. Borderline prostatomegaly. Cholelithiasis.  04/21/18 BM Bx revealed Normocellular bone marrow with Plasma Cell neoplasm. Scattered medium sized clusters of kappa-restricted plasma cells (14% aspirate, 10% CD138 immunohistochemistry.   I do suspect that the burden of disease is underestimated in the bone marrow sample given PET/CT findings of innumerable lytic lesions and an M spike of 3.4g   06/08/18 DG Hips bilat which revealed Innumerable lucencies are noted throughout the pelvis and proximal femurs, similar findings noted on prior PET-CT of 04/23/2018. Findings are consistent patient's known multiple myeloma. Femoral necks are intact. 2.  Aortoiliac atherosclerotic vascular disease.  2. Pathologic right  clavicle fracture due to myeloma- resolved 3. Hypercalcemia- resolved.  PLAN:    -Discussed pt labwork today, 07/25/2020; ***    -Advised pt of q4weeks Daratumumab if slight increase and conservative approach.  -Continue OTC Vitamin D. -Continue Xgeva q8weeks with labs. -Continue stationary bike and physical activity daily.  -Will see back in ***   FOLLOW UP: ***   The total time spent in the appointment was *** minutes and more than 50% was on counseling and direct patient cares.   All of the patient's questions were answered with apparent satisfaction. The patient knows to call the clinic with any problems, questions or concerns.    Sullivan Lone MD [place] AAHIVMS [facility] Hematology/Oncology Physician [facility]  (Office):       [phone] (Work cell):  [phone] (Fax):           [phone]  07/24/2020 8:18 PM  I, Reinaldo Raddle, am acting as scribe for Dr. Sullivan Lone, MD.

## 2020-07-25 ENCOUNTER — Telehealth: Payer: Self-pay | Admitting: *Deleted

## 2020-07-25 ENCOUNTER — Inpatient Hospital Stay: Payer: Medicare Other | Attending: Hematology

## 2020-07-25 ENCOUNTER — Inpatient Hospital Stay: Payer: Medicare Other | Admitting: Hematology

## 2020-07-25 ENCOUNTER — Inpatient Hospital Stay: Payer: Medicare Other

## 2020-07-25 DIAGNOSIS — D509 Iron deficiency anemia, unspecified: Secondary | ICD-10-CM | POA: Insufficient documentation

## 2020-07-25 DIAGNOSIS — Z8041 Family history of malignant neoplasm of ovary: Secondary | ICD-10-CM | POA: Insufficient documentation

## 2020-07-25 DIAGNOSIS — Z803 Family history of malignant neoplasm of breast: Secondary | ICD-10-CM | POA: Insufficient documentation

## 2020-07-25 DIAGNOSIS — Z79899 Other long term (current) drug therapy: Secondary | ICD-10-CM | POA: Insufficient documentation

## 2020-07-25 DIAGNOSIS — Z5112 Encounter for antineoplastic immunotherapy: Secondary | ICD-10-CM | POA: Insufficient documentation

## 2020-07-25 DIAGNOSIS — Z87891 Personal history of nicotine dependence: Secondary | ICD-10-CM | POA: Insufficient documentation

## 2020-07-25 DIAGNOSIS — Z833 Family history of diabetes mellitus: Secondary | ICD-10-CM | POA: Insufficient documentation

## 2020-07-25 DIAGNOSIS — Z7982 Long term (current) use of aspirin: Secondary | ICD-10-CM | POA: Insufficient documentation

## 2020-07-25 DIAGNOSIS — I10 Essential (primary) hypertension: Secondary | ICD-10-CM | POA: Insufficient documentation

## 2020-07-25 DIAGNOSIS — C9000 Multiple myeloma not having achieved remission: Secondary | ICD-10-CM | POA: Insufficient documentation

## 2020-07-25 DIAGNOSIS — Z8249 Family history of ischemic heart disease and other diseases of the circulatory system: Secondary | ICD-10-CM | POA: Insufficient documentation

## 2020-07-25 NOTE — Telephone Encounter (Signed)
Patient did not come to [place] this morning for appts for lab/Dr.Kale/infusion. Contacted daughter [name] [phone] at [phone] and left a message requesting she contact office to reschedule.

## 2020-07-25 NOTE — Telephone Encounter (Signed)
Daughter called - she arranges appts for patient and has been out of country on business. States she just got her days mixed up and did not bring him to [facility] today. Advised her that schedule message sent to r/s appts as soon as openings are available.

## 2020-07-28 ENCOUNTER — Other Ambulatory Visit: Payer: Medicare Other

## 2020-07-28 ENCOUNTER — Ambulatory Visit: Payer: Medicare Other

## 2020-07-28 ENCOUNTER — Ambulatory Visit: Payer: Medicare Other | Admitting: Hematology

## 2020-07-31 ENCOUNTER — Ambulatory Visit: Payer: Medicare Other

## 2020-07-31 ENCOUNTER — Other Ambulatory Visit: Payer: Medicare Other

## 2020-07-31 ENCOUNTER — Ambulatory Visit: Payer: Medicare Other | Admitting: Hematology

## 2020-07-31 ENCOUNTER — Other Ambulatory Visit: Payer: Self-pay | Admitting: *Deleted

## 2020-07-31 DIAGNOSIS — C9001 Multiple myeloma in remission: Secondary | ICD-10-CM

## 2020-08-01 ENCOUNTER — Other Ambulatory Visit: Payer: Medicare Other

## 2020-08-01 ENCOUNTER — Ambulatory Visit: Payer: Medicare Other | Admitting: Hematology

## 2020-08-01 NOTE — Progress Notes (Signed)
HEMATOLOGY/ONCOLOGY CLINIC NOTE  Date of Service: 08/02/2020  Patient Care Team: [name], MD as PCP - General (Family Medicine) [name], MD (Cardiology) [name], MD (Cardiology) [name], DO as Consulting Physician (Neurology)  CHIEF COMPLAINTS/PURPOSE OF CONSULTATION:  Continue mx of myeloma  HISTORY OF PRESENTING ILLNESS:   Jonathon [name] is a wonderful 85 y.o. male who has been referred to [place] by Dr. [name] for evaluation and management of Lytic bone lesions. He is accompanied today by his daughter. The pt reports that he is doing well overall. He is currently a resident at [facility] in [place], [place].   The pt presented to the ED on 03/24/18 with a right distal clavicle fracture, which occurred after trying to get out of bed and pushing himself up with his right arm. He notes that his energy has been noticeably decreased for the last month. He also notes that his lower back has a slight sting when trying to stand up from sitting. He also notes some pain in his lower, right chest wall, which is exacerbated when coughing. The pt has been taking 32m Tramadol which is controlling his pain. He does endorse some weakening of both of his lower extremities in the past 1-2 months, which he attributes somewhat to his recent lower back pain.   The pt denies any bowel abnormalities recently. He notes that he stays very well hydrated, and urinates 4-5 times a night, and does have BPH, and takes Flomax. The pt denies any recent changes in his urination habits.   The pt notes that his appetite decreased for a few months, until he began assisted living. He has lost 9 pounds in the last week.   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 He now uses a walker to ambulate. He notes that the CIDP symptoms are primarily located to his legs.  He sees Dr. [name] Neurology. He took steroids without any symptomatic relief. IVIG was cost-prohibitive for the patient, but was recommended.    Regarding the patient's anemia in April 2019, he denies any obvious bleeding at that time, and denies surgeries.    The pt notes that his last colonoscopy, in 2015, was not concerning, which is corroborated by chart review.   The pt and daughter deny any concerns for memory issues, and the daughter adds his memory is "elephant-like". They are not sure why Namenda is on his medication list.   The pt denies any recent dental procedures, or concerns for dental issues. He has maintained every 6 month dental cleanings for all of his life.   Of note prior to the patient's visit today, pt has had a CT C/A/P completed on 03/31/18 with results revealing IMPRESSION: 1. Lytic lesion throughout all visualized vertebra from the lower cervical spine through the sacrum. Epidural extension of tumor T2, T3 and L2 level. Right L3-4 foraminal extension tumor. 2. Lytic lesions involving majority of ribs. Multiple pathologic rib fractures with bony expansion/sub pleural extension of tumor at several levels. 3. Lytic lesions of the scapula, hips bilaterally and pelvis bilaterally. With further progression of tumor, patient may be at risk for pathologic fractures. Pathologic fracture right clavicle incompletely assessed. 4. Circumferential narrowing sigmoid colon. Question possibility of underlying mass (series 2, image 97). Alternatively this could represent muscular hypertrophy/peristalsis. 5. Gastric  antrum circumferential narrowing. Cannot exclude mass although this may be related to peristalsis. 6. No primary lung malignancy noted. 7. Matted aortic pulmonary window lymph nodes. 8. Circumferential urinary bladder wall thickening greater anterior dome region. Right lateral bladder diverticulum. 9. 5 mm low-density lesion pancreatic body (series 5, image 10) unchanged.  This is felt unlikely to be a primary malignancy. 10. Right parapelvic 2.2 cm cyst without significant change. Scattered low-density renal lesions bila2terally too small to adequately characterize. Some were present previously. Statistically these are likely cysts. 11. Stable adrenal gland hyperplasia. 12. Gynecomastia. 13. Prostate gland causes slight impression upon the bladder base. 14.  Aortic Atherosclerosis. 15. Sequential pacemaker in place. 16. Gallstones.  Most recent lab results (03/25/18) of CBC w/diff and CMP is as follows: all values are WNL except for RBC at 4.14, HGB at 11.3, HCT at 33.6, Chloride at 94, CO2 at 33, Glucose at 100, BUN at 25, Total Protein at 9.4, Albumin at 3.4, Calcium at 13.8, GFR at 58. 03/25/18 PSA, Total was normal at 1.2  On review of systems, pt reports positional lower back pain, lower right chest wall pain, recently decreased energy levels, relatively weaker appetite, weight loss, right clavicle pain, weakening of the lower extremities, moving his bowels well, and denies changes in urination, abdominal pains, changes in breathing, changes in bowel habits, pain along the spine, leg swelling, and any other symptoms.  INTERVAL HISTORY:  [name] returns today for management and evaluation of his Multiple Myeloma. We are joined today by his daughter. The patient's last visit with [place] was on 06/02/2020. The pt reports that he is doing well overall.  The pt reports that he has been experiencing some lower back pain when he stand for extended periods of time. The pt notes he has been staying active. He notes no issues tolerating the Daratumumab.   Lab results today 08/02/2020 of CBC w/diff and CMP is as follows: all values are WNL except for Calcium of 8.6, Hgb of 12.5, HCT of 38.3. 08/02/2020 MMP in progress.  On review of systems, pt reports lower back pain upon much activity and denies abdominal pain, leg swelling, and any other symptoms.  MEDICAL HISTORY:   Past Medical History:  Diagnosis Date  . Asthma   . BENIGN PROSTATIC HYPERTROPHY, HX OF 10/25/2008  . Bone metastases ([place]) 04/14/2018  . BRADYCARDIA 2005  . Chronic diastolic congestive heart failure ([place]) 06/05/2016  . CIDP (chronic inflammatory demyelinating polyneuropathy) ([place])   . HYPERTENSION 11/21/2006  . Iron deficiency anemia, unspecified 04/19/2013  . Multiple myeloma ([place]) 04/29/2018  . 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 PACEMAKER, PERMANENT 2005   Gen change 2014 Medtronic Adaptic L dual-chamber pacemaker, serial [id number]   . SVT (supraventricular tachycardia) ([place])   . TOBACCO ABUSE 10/25/2008   Quit 2012    SURGICAL HISTORY: Past Surgical History:  Procedure Laterality Date  . COLONOSCOPY  [id number]  . PACEMAKER INSERTION  2005  . PERMANENT PACEMAKER GENERATOR CHANGE N/A 06/19/2012   Procedure: PERMANENT PACEMAKER GENERATOR CHANGE;  Surgeon: [name], MD; Medtronic Adaptic L dual-chamber pacemaker, serial #[id number]    . SKIN GRAFT Right 1962   wrist    SOCIAL HISTORY: Social History   Socioeconomic History  . Marital status: Divorced    Spouse name: Not on file  . Number of children: 4  . Years of education: Not on file  . Highest education level: Not on file  Occupational History  . Occupation: ENVIROMENTAL SERVICE  Employer: [employer]  . Occupation: retired  Tobacco Use  . Smoking status: Former Smoker    Packs/day: 0.25    Years: 46.00    Pack years: 11.50    Types: Cigarettes    Start date: 03/16/1965    Quit date: 06/15/2010    Years since quitting: 10.1  . Smokeless tobacco: Never Used  . Tobacco comment: quit 3 years ago  Vaping Use  . Vaping Use: Never used  Substance and Sexual Activity  . Alcohol use: No    Alcohol/week: 0.0 standard drinks  . Drug use: No  . Sexual activity: Not on file  Other Topics Concern  . Not on file  Social History Narrative   Has relocated from [place] in 2007. 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  Patient in a facility thru [facility]   Social Determinants of Health   Financial Resource Strain: Not on file  Food Insecurity: Not on file  Transportation Needs: Not on file  Physical Activity: Not on file  Stress: Not on file  Social Connections: Not on file  Intimate Partner Violence: Not on file    FAMILY HISTORY: Family History  Problem Relation Age of Onset  . Heart attack Father        Died, 41  . Kidney disease Father   . Parkinson's disease Mother        Died, 19  . Breast cancer Sister        Living, 74  . Diabetes type II Brother        Living, 85  . Breast cancer Sister        Living, 38  . Diabetes Daughter        Living, 26  . Diabetes Son        Living, 54  . Ovarian cancer Daughter   . Colon cancer Neg Hx   . Rectal cancer Neg Hx   . Stomach cancer Neg Hx     ALLERGIES:  is allergic to lexapro [escitalopram oxalate].  MEDICATIONS:  Current Outpatient Medications  Medication Sig Dispense Refill  . acyclovir (ZOVIRAX) 400 MG tablet Take 1 tablet (400 mg total) by mouth 2 (two) times daily. 60 tablet 11  . aspirin EC 81 MG tablet Take 81 mg by mouth daily.    Marland Kitchen diltiazem (TIAZAC) 240 MG 24 hr capsule Take 1 capsule by mouth daily.    . flecainide (TAMBOCOR) 50 MG tablet Take 75 mg by mouth daily as needed.    . furosemide (LASIX) 40 MG tablet Take 40 mg by mouth daily.    . irbesartan (AVAPRO) 300 MG tablet Take 300 mg by mouth daily.    . memantine (NAMENDA) 5 MG tablet Take 5 mg by mouth 2 (two) times daily.    . metoprolol tartrate (LOPRESSOR) 50 MG tablet Take 1 tablet (50 mg total) by mouth 2 (two) times daily. 60 tablet 0  . ondansetron (ZOFRAN) 8 MG tablet Take 1 tablet (8 mg total) by mouth 2 (two) times daily as needed (Nausea or vomiting). 30 tablet 1  . polyethylene glycol (MIRALAX / GLYCOLAX) packet Take 17 g by mouth daily. 14 each 0  . prochlorperazine (COMPAZINE) 10 MG tablet Take 1 tablet (10 mg total) by mouth every 6  (six) hours as needed (Nausea or vomiting). 30 tablet 1  . Saccharomyces boulardii (FLORASTOR PO) Take 250 mg by mouth 2 (two) times daily.    . tamsulosin (FLOMAX) 0.4 MG CAPS capsule Take 2 capsules (0.8  mg total) by mouth daily. 90 capsule 0   No current facility-administered medications for this visit.   Facility-Administered Medications Ordered in Other Visits  Medication Dose Route Frequency Provider Last Rate Last Admin  . sodium chloride flush (NS) 0.9 % injection 10 mL  10 mL Intracatheter PRN Brunetta Genera, MD        REVIEW OF SYSTEMS:   10 Point review of Systems was done is negative except as noted above.  PHYSICAL EXAMINATION: ECOG PERFORMANCE STATUS: 2 - Symptomatic, <50% confined to bed  .BP (!) 185/88 (BP Location: Left Arm, Patient Position: Sitting)   Pulse 61   Temp (!) 97 F (36.1 C) (Tympanic)   Resp 19   Ht '5\' 11"'  (1.803 m)   Wt 218 lb (98.9 kg)   SpO2 100%   BMI 30.40 kg/m   GENERAL:alert, in no acute distress and comfortable SKIN: no acute rashes, no significant lesions EYES: conjunctiva are pink and non-injected, sclera anicteric OROPHARYNX: MMM, no exudates, no oropharyngeal erythema or ulceration NECK: supple, no JVD LYMPH:  no palpable lymphadenopathy in the cervical, axillary or inguinal regions LUNGS: clear to auscultation b/l with normal respiratory effort HEART: regular rate & rhythm ABDOMEN:  normoactive bowel sounds , non tender, not distended. Extremity: no pedal edema PSYCH: alert & oriented x 3 with fluent speech NEURO: no focal motor/sensory deficits   LABORATORY DATA:  I have reviewed the data as listed  CBC Latest Ref Rng & Units 08/02/2020 06/27/2020 06/02/2020  WBC 4.0 - 10.5 K/uL 6.4 5.9 5.1  Hemoglobin 13.0 - 17.0 g/dL 12.5(L) 12.6(L) 12.3(L)  Hematocrit 39.0 - 52.0 % 38.3(L) 38.1(L) 38.4(L)  Platelets 150 - 400 K/uL 155 153 158    CMP Latest Ref Rng & Units 08/02/2020 06/27/2020 06/02/2020  Glucose 70 - 99 mg/dL 90 96  86  BUN 8 - 23 mg/dL '20 15 14  ' Creatinine 0.61 - 1.24 mg/dL 1.17 1.26(H) 1.12  Sodium 135 - 145 mmol/L 142 138 141  Potassium 3.5 - 5.1 mmol/L 4.4 4.0 4.1  Chloride 98 - 111 mmol/L 105 104 108  CO2 22 - 32 mmol/L '25 26 25  ' Calcium 8.9 - 10.3 mg/dL 8.6(L) 9.1 8.8(L)  Total Protein 6.5 - 8.1 g/dL 7.0 7.1 7.3  Total Bilirubin 0.3 - 1.2 mg/dL 0.5 0.7 0.5  Alkaline Phos 38 - 126 U/L 65 67 70  AST 15 - 41 U/L '16 16 16  ' ALT 0 - 44 U/L '11 8 9   ' 12/14/2018 MMP    12/14/2018 MMP    04/17/18 BM Bx:    RADIOGRAPHIC STUDIES: I have personally reviewed the radiological images as listed and agreed with the findings in the report. CUP PACEART REMOTE DEVICE CHECK  Result Date: 07/06/2020 Scheduled remote reviewed. Normal device function.  Next remote 91 days. Kathy Breach, RN, CCDS, [employer]   ASSESSMENT & PLAN:  85 y.o. male with  1. Lytic bone lesions - w/u consistent with newly diagnosed multiple myeloma Labs upon initial presentation from 04/14/18; hypercalcemic with Calcium at 13.8, renal function up form baseline with Creatinine at 1.48, HGB at 11.3. PSA was normal.   03/31/18 CT C/A/P revealed 1. Lytic lesion throughout all visualized vertebra from the lower cervical spine through the sacrum. Epidural extension of tumor T2, T3 and L2 level. Right L3-4 foraminal extension tumor. 2. Lytic lesions involving majority of ribs. Multiple pathologic rib fractures with bony expansion/sub pleural extension of tumor at several levels. 3. Lytic lesions of  the scapula, hips bilaterally and pelvis bilaterally. With further progression of tumor, patient may be at risk for pathologic fractures. Pathologic fracture right clavicle incompletely assessed. 4. Circumferential narrowing sigmoid colon. Question possibility of underlying mass (series 2, image 97). Alternatively this could represent muscular hypertrophy/peristalsis. 5. Gastric antrum circumferential narrowing. Cannot exclude mass although  this may be related to peristalsis. 6. No primary lung malignancy noted. 7. Matted aortic pulmonary window lymph nodes. 8. Circumferential urinary bladder wall thickening greater anterior dome region. Right lateral bladder diverticulum. 9. 5 mm low-density lesion pancreatic body (series 5, image 10) unchanged. This is felt unlikely to be a primary malignancy. 10. Right parapelvic 2.2 cm cyst without significant change. Scattered low-density renal lesions bilaterally too small to adequately characterize. Some were present previously. Statistically these are likely cysts. 11. Stable adrenal gland hyperplasia. 12. Gynecomastia. 13. Prostate gland causes slight impression upon the bladder base. 14.  Aortic Atherosclerosis. 15. Sequential pacemaker in place. 16. Gallstones.   04/23/18 PET/CT revealed Innumerable hypermetabolic lytic lesions throughout the skeleton especially involving the spine, ribs, bony pelvis along with some involvement of both proximal femurs. The appearance is compatible with pathologic diagnosis of plasma cell neoplasm could well reflect multiple myeloma. Resulting pathologic fractures of the right lateral clavicle and of multiple bilateral ribs. Lesions are present along the cortical margins of the spinal canal. 2. Both the area and the stomach in the area in the sigmoid colon drawn attention to on prior CT scan appear benign normal today. 3. The confluence of AP window lymph nodes measures about 1.2 cm in short axis with maximum SUV 3.1, which is mildly above the blood pool merit surveillance. 4. Other imaging findings of potential clinical significance: Aortic Atherosclerosis. Coronary atherosclerosis. Pacemaker noted. Borderline prostatomegaly. Cholelithiasis.  04/21/18 BM Bx revealed Normocellular bone marrow with Plasma Cell neoplasm. Scattered medium sized clusters of kappa-restricted plasma cells (14% aspirate, 10% CD138 immunohistochemistry.   I do suspect that the burden of disease  is underestimated in the bone marrow sample given PET/CT findings of innumerable lytic lesions and an M spike of 3.4g   06/08/18 DG Hips bilat which revealed Innumerable lucencies are noted throughout the pelvis and proximal femurs, similar findings noted on prior PET-CT of 04/23/2018. Findings are consistent patient's known multiple myeloma. Femoral necks are intact. 2.  Aortoiliac atherosclerotic vascular disease.  2. Pathologic right clavicle fracture due to myeloma- resolved 3. Hypercalcemia- resolved.   PLAN:    -Discussed pt labwork today, 08/02/2020; blood chemistries normal, blood counts mostly normal.  -Advised pt his last MMP m-protein was down to 0.2. -The pt has no prohibitive toxicities from continuing q4weeks Daratumumab at this time. -Discussed [place] and pt's eligibility. The pt wishes to get a referral. -Continue OTC Vitamin D. -Continue Lianne Cure with labs. -Continue stationary bike and physical activity daily.  -Will see back in 2 months with labs.   FOLLOW UP: F/u for next 3 treatments as scheduled   The total time spent in the appointment was 20 minutes and more than 50% was on counseling and direct patient cares.   All of the patient's questions were answered with apparent satisfaction. The patient knows to call the clinic with any problems, questions or concerns.    Sullivan Lone MD [place] AAHIVMS [facility] Hematology/Oncology Physician [facility]  (Office):       [phone] (Work cell):  [phone] (Fax):           [phone]  08/02/2020 3:55 PM  I,  Reinaldo Raddle, am acting as scribe for Dr. Sullivan Lone, MD.     .I have reviewed the above documentation for accuracy and completeness, and I agree with the above. Brunetta Genera MD

## 2020-08-02 ENCOUNTER — Inpatient Hospital Stay: Payer: Medicare Other

## 2020-08-02 ENCOUNTER — Inpatient Hospital Stay (HOSPITAL_BASED_OUTPATIENT_CLINIC_OR_DEPARTMENT_OTHER): Payer: Medicare Other | Admitting: Hematology

## 2020-08-02 ENCOUNTER — Other Ambulatory Visit: Payer: Self-pay

## 2020-08-02 VITALS — BP 185/88 | HR 61 | Temp 97.0°F | Resp 19 | Ht 71.0 in | Wt 218.0 lb

## 2020-08-02 DIAGNOSIS — Z87891 Personal history of nicotine dependence: Secondary | ICD-10-CM | POA: Diagnosis not present

## 2020-08-02 DIAGNOSIS — C9001 Multiple myeloma in remission: Secondary | ICD-10-CM

## 2020-08-02 DIAGNOSIS — C9000 Multiple myeloma not having achieved remission: Secondary | ICD-10-CM | POA: Diagnosis not present

## 2020-08-02 DIAGNOSIS — I10 Essential (primary) hypertension: Secondary | ICD-10-CM | POA: Diagnosis not present

## 2020-08-02 DIAGNOSIS — Z833 Family history of diabetes mellitus: Secondary | ICD-10-CM | POA: Diagnosis not present

## 2020-08-02 DIAGNOSIS — D509 Iron deficiency anemia, unspecified: Secondary | ICD-10-CM | POA: Diagnosis not present

## 2020-08-02 DIAGNOSIS — Z79899 Other long term (current) drug therapy: Secondary | ICD-10-CM | POA: Diagnosis not present

## 2020-08-02 DIAGNOSIS — Z5112 Encounter for antineoplastic immunotherapy: Secondary | ICD-10-CM | POA: Diagnosis not present

## 2020-08-02 DIAGNOSIS — Z8041 Family history of malignant neoplasm of ovary: Secondary | ICD-10-CM | POA: Diagnosis not present

## 2020-08-02 DIAGNOSIS — Z7982 Long term (current) use of aspirin: Secondary | ICD-10-CM | POA: Diagnosis not present

## 2020-08-02 DIAGNOSIS — Z803 Family history of malignant neoplasm of breast: Secondary | ICD-10-CM | POA: Diagnosis not present

## 2020-08-02 DIAGNOSIS — Z8249 Family history of ischemic heart disease and other diseases of the circulatory system: Secondary | ICD-10-CM | POA: Diagnosis not present

## 2020-08-02 LAB — CBC WITH DIFFERENTIAL (CANCER CENTER ONLY)
Abs Immature Granulocytes: 0.01 10*3/uL (ref 0.00–0.07)
Basophils Absolute: 0.1 10*3/uL (ref 0.0–0.1)
Basophils Relative: 1 %
Eosinophils Absolute: 0.4 10*3/uL (ref 0.0–0.5)
Eosinophils Relative: 5 %
HCT: 38.3 % — ABNORMAL LOW (ref 39.0–52.0)
Hemoglobin: 12.5 g/dL — ABNORMAL LOW (ref 13.0–17.0)
Immature Granulocytes: 0 %
Lymphocytes Relative: 48 %
Lymphs Abs: 3 10*3/uL (ref 0.7–4.0)
MCH: 27.8 pg (ref 26.0–34.0)
MCHC: 32.6 g/dL (ref 30.0–36.0)
MCV: 85.3 fL (ref 80.0–100.0)
Monocytes Absolute: 0.5 10*3/uL (ref 0.1–1.0)
Monocytes Relative: 8 %
Neutro Abs: 2.5 10*3/uL (ref 1.7–7.7)
Neutrophils Relative %: 38 %
Platelet Count: 155 10*3/uL (ref 150–400)
RBC: 4.49 MIL/uL (ref 4.22–5.81)
RDW: 14 % (ref 11.5–15.5)
WBC Count: 6.4 10*3/uL (ref 4.0–10.5)
nRBC: 0 % (ref 0.0–0.2)

## 2020-08-02 LAB — CMP (CANCER CENTER ONLY)
ALT: 11 U/L (ref 0–44)
AST: 16 U/L (ref 15–41)
Albumin: 3.7 g/dL (ref 3.5–5.0)
Alkaline Phosphatase: 65 U/L (ref 38–126)
Anion gap: 12 (ref 5–15)
BUN: 20 mg/dL (ref 8–23)
CO2: 25 mmol/L (ref 22–32)
Calcium: 8.6 mg/dL — ABNORMAL LOW (ref 8.9–10.3)
Chloride: 105 mmol/L (ref 98–111)
Creatinine: 1.17 mg/dL (ref 0.61–1.24)
GFR, Estimated: >60 mL/min (ref >60)
Glucose, Bld: 90 mg/dL (ref 70–99)
Potassium: 4.4 mmol/L (ref 3.5–5.1)
Sodium: 142 mmol/L (ref 135–145)
Total Bilirubin: 0.5 mg/dL (ref 0.3–1.2)
Total Protein: 7 g/dL (ref 6.5–8.1)

## 2020-08-03 ENCOUNTER — Telehealth: Payer: Self-pay | Admitting: Hematology

## 2020-08-03 ENCOUNTER — Inpatient Hospital Stay: Payer: Medicare Other

## 2020-08-03 VITALS — BP 193/83 | HR 60 | Temp 98.1°F | Resp 17

## 2020-08-03 DIAGNOSIS — C9000 Multiple myeloma not having achieved remission: Secondary | ICD-10-CM

## 2020-08-03 DIAGNOSIS — C7951 Secondary malignant neoplasm of bone: Secondary | ICD-10-CM

## 2020-08-03 DIAGNOSIS — Z5112 Encounter for antineoplastic immunotherapy: Secondary | ICD-10-CM | POA: Diagnosis not present

## 2020-08-03 DIAGNOSIS — C9001 Multiple myeloma in remission: Secondary | ICD-10-CM

## 2020-08-03 DIAGNOSIS — Z7189 Other specified counseling: Secondary | ICD-10-CM

## 2020-08-03 MED ORDER — SODIUM CHLORIDE 0.9 % IV SOLN
16.0000 mg/kg | Freq: Once | INTRAVENOUS | Status: AC
  Administered 2020-08-03: 1600 mg via INTRAVENOUS
  Filled 2020-08-03: qty 80

## 2020-08-03 MED ORDER — ACETAMINOPHEN 325 MG PO TABS
ORAL_TABLET | ORAL | Status: AC
  Filled 2020-08-03: qty 2

## 2020-08-03 MED ORDER — DIPHENHYDRAMINE HCL 25 MG PO CAPS
ORAL_CAPSULE | ORAL | Status: AC
  Filled 2020-08-03: qty 2

## 2020-08-03 MED ORDER — SODIUM CHLORIDE 0.9 % IV SOLN
Freq: Once | INTRAVENOUS | Status: AC
  Filled 2020-08-03: qty 250

## 2020-08-03 MED ORDER — ACETAMINOPHEN 325 MG PO TABS
650.0000 mg | ORAL_TABLET | Freq: Once | ORAL | Status: AC
  Administered 2020-08-03: 650 mg via ORAL

## 2020-08-03 MED ORDER — FAMOTIDINE IN NACL 20-0.9 MG/50ML-% IV SOLN
INTRAVENOUS | Status: AC
  Filled 2020-08-03: qty 50

## 2020-08-03 MED ORDER — FAMOTIDINE IN NACL 20-0.9 MG/50ML-% IV SOLN
20.0000 mg | Freq: Once | INTRAVENOUS | Status: AC
  Administered 2020-08-03: 20 mg via INTRAVENOUS

## 2020-08-03 MED ORDER — MONTELUKAST SODIUM 10 MG PO TABS
10.0000 mg | ORAL_TABLET | Freq: Once | ORAL | Status: AC
Start: 2020-08-03 — End: 2020-08-03
  Administered 2020-08-03: 10 mg via ORAL
  Filled 2020-08-03: qty 1

## 2020-08-03 MED ORDER — DIPHENHYDRAMINE HCL 25 MG PO CAPS
50.0000 mg | ORAL_CAPSULE | Freq: Once | ORAL | Status: AC
  Administered 2020-08-03: 50 mg via ORAL

## 2020-08-03 MED ORDER — DENOSUMAB 120 MG/1.7ML ~~LOC~~ SOLN
SUBCUTANEOUS | Status: AC
  Filled 2020-08-03: qty 1.7

## 2020-08-03 MED ORDER — DENOSUMAB 120 MG/1.7ML ~~LOC~~ SOLN
120.0000 mg | Freq: Once | SUBCUTANEOUS | Status: AC
  Administered 2020-08-03: 120 mg via SUBCUTANEOUS

## 2020-08-03 MED ORDER — SODIUM CHLORIDE 0.9 % IV SOLN
20.0000 mg | Freq: Once | INTRAVENOUS | Status: AC
  Administered 2020-08-03: 20 mg via INTRAVENOUS
  Filled 2020-08-03: qty 20

## 2020-08-03 NOTE — Telephone Encounter (Signed)
Checked out appointment. No LOS notes needing to be scheduled. No changes made. 

## 2020-08-04 LAB — MULTIPLE MYELOMA PANEL, SERUM
Albumin SerPl Elph-Mcnc: 3.5 g/dL (ref 2.9–4.4)
Albumin/Glob SerPl: 1.1 (ref 0.7–1.7)
Alpha 1: 0.2 g/dL (ref 0.0–0.4)
Alpha2 Glob SerPl Elph-Mcnc: 0.9 g/dL (ref 0.4–1.0)
B-Globulin SerPl Elph-Mcnc: 1.1 g/dL (ref 0.7–1.3)
Gamma Glob SerPl Elph-Mcnc: 1 g/dL (ref 0.4–1.8)
Globulin, Total: 3.2 g/dL (ref 2.2–3.9)
IgA: 640 mg/dL — ABNORMAL HIGH (ref 61–437)
IgG (Immunoglobin G), Serum: 662 mg/dL (ref 603–1613)
IgM (Immunoglobulin M), Srm: 34 mg/dL (ref 15–143)
M Protein SerPl Elph-Mcnc: 0.3 g/dL — ABNORMAL HIGH
Total Protein ELP: 6.7 g/dL (ref 6.0–8.5)

## 2020-08-09 ENCOUNTER — Telehealth: Payer: Self-pay | Admitting: Family Medicine

## 2020-08-09 NOTE — Telephone Encounter (Signed)
Jonathon [name] is calling in to see if she can get the Medication Summary Report (5 pages) from [facility].  It was faxed the first time on 08/03/2020 @ 11:15 a.m and will refax 08/09/2020 @ 2:10 p.m.  She would like to have a call back to let her know when it has been faxed.

## 2020-08-11 NOTE — Telephone Encounter (Signed)
Tried Automotive engineer at call back number listed, tried several options and no answer. Still no paperwork/forms received by fax for patient.

## 2020-08-15 NOTE — Telephone Encounter (Signed)
Paper work was received, completed and faxed on 08/15/20.

## 2020-08-22 ENCOUNTER — Other Ambulatory Visit: Payer: Self-pay

## 2020-08-22 ENCOUNTER — Inpatient Hospital Stay: Payer: Medicare Other

## 2020-08-22 ENCOUNTER — Ambulatory Visit: Payer: Medicare Other

## 2020-08-22 ENCOUNTER — Inpatient Hospital Stay: Payer: Medicare Other | Attending: Hematology

## 2020-08-22 ENCOUNTER — Other Ambulatory Visit: Payer: Medicare Other

## 2020-08-22 VITALS — BP 165/83 | HR 59 | Temp 98.0°F | Resp 18 | Wt 215.5 lb

## 2020-08-22 DIAGNOSIS — Z7189 Other specified counseling: Secondary | ICD-10-CM

## 2020-08-22 DIAGNOSIS — C9001 Multiple myeloma in remission: Secondary | ICD-10-CM

## 2020-08-22 DIAGNOSIS — C9000 Multiple myeloma not having achieved remission: Secondary | ICD-10-CM | POA: Diagnosis not present

## 2020-08-22 DIAGNOSIS — Z5112 Encounter for antineoplastic immunotherapy: Secondary | ICD-10-CM | POA: Diagnosis not present

## 2020-08-22 LAB — CMP (CANCER CENTER ONLY)
ALT: 8 U/L (ref 0–44)
AST: 16 U/L (ref 15–41)
Albumin: 3.9 g/dL (ref 3.5–5.0)
Alkaline Phosphatase: 72 U/L (ref 38–126)
Anion gap: 11 (ref 5–15)
BUN: 18 mg/dL (ref 8–23)
CO2: 23 mmol/L (ref 22–32)
Calcium: 9.1 mg/dL (ref 8.9–10.3)
Chloride: 104 mmol/L (ref 98–111)
Creatinine: 1.16 mg/dL (ref 0.61–1.24)
GFR, Estimated: >60 mL/min (ref >60)
Glucose, Bld: 104 mg/dL — ABNORMAL HIGH (ref 70–99)
Potassium: 4.4 mmol/L (ref 3.5–5.1)
Sodium: 138 mmol/L (ref 135–145)
Total Bilirubin: 0.7 mg/dL (ref 0.3–1.2)
Total Protein: 7.5 g/dL (ref 6.5–8.1)

## 2020-08-22 LAB — CBC WITH DIFFERENTIAL/PLATELET
Abs Immature Granulocytes: 0.01 10*3/uL (ref 0.00–0.07)
Basophils Absolute: 0.1 10*3/uL (ref 0.0–0.1)
Basophils Relative: 1 %
Eosinophils Absolute: 0.4 10*3/uL (ref 0.0–0.5)
Eosinophils Relative: 6 %
HCT: 39.8 % (ref 39.0–52.0)
Hemoglobin: 13 g/dL (ref 13.0–17.0)
Immature Granulocytes: 0 %
Lymphocytes Relative: 39 %
Lymphs Abs: 2.4 10*3/uL (ref 0.7–4.0)
MCH: 27.5 pg (ref 26.0–34.0)
MCHC: 32.7 g/dL (ref 30.0–36.0)
MCV: 84.3 fL (ref 80.0–100.0)
Monocytes Absolute: 0.5 10*3/uL (ref 0.1–1.0)
Monocytes Relative: 8 %
Neutro Abs: 2.8 10*3/uL (ref 1.7–7.7)
Neutrophils Relative %: 46 %
Platelets: 163 10*3/uL (ref 150–400)
RBC: 4.72 MIL/uL (ref 4.22–5.81)
RDW: 13.8 % (ref 11.5–15.5)
WBC: 6.1 10*3/uL (ref 4.0–10.5)
nRBC: 0 % (ref 0.0–0.2)

## 2020-08-22 MED ORDER — ACETAMINOPHEN 325 MG PO TABS
650.0000 mg | ORAL_TABLET | Freq: Once | ORAL | Status: AC
  Administered 2020-08-22: 650 mg via ORAL

## 2020-08-22 MED ORDER — SODIUM CHLORIDE 0.9 % IV SOLN
16.0000 mg/kg | Freq: Once | INTRAVENOUS | Status: AC
  Administered 2020-08-22: 1600 mg via INTRAVENOUS
  Filled 2020-08-22: qty 80

## 2020-08-22 MED ORDER — DIPHENHYDRAMINE HCL 25 MG PO CAPS
ORAL_CAPSULE | ORAL | Status: AC
  Filled 2020-08-22: qty 2

## 2020-08-22 MED ORDER — MONTELUKAST SODIUM 10 MG PO TABS
10.0000 mg | ORAL_TABLET | Freq: Once | ORAL | Status: AC
Start: 2020-08-22 — End: 2020-08-22
  Administered 2020-08-22: 10 mg via ORAL

## 2020-08-22 MED ORDER — FAMOTIDINE IN NACL 20-0.9 MG/50ML-% IV SOLN
20.0000 mg | Freq: Once | INTRAVENOUS | Status: AC
Start: 2020-08-22 — End: 2020-08-22
  Administered 2020-08-22: 20 mg via INTRAVENOUS

## 2020-08-22 MED ORDER — ACETAMINOPHEN 325 MG PO TABS
ORAL_TABLET | ORAL | Status: AC
  Filled 2020-08-22: qty 2

## 2020-08-22 MED ORDER — SODIUM CHLORIDE 0.9 % IV SOLN
Freq: Once | INTRAVENOUS | Status: AC
Start: 2020-08-22 — End: 2020-08-22
  Filled 2020-08-22: qty 250

## 2020-08-22 MED ORDER — MONTELUKAST SODIUM 10 MG PO TABS
ORAL_TABLET | ORAL | Status: AC
  Filled 2020-08-22: qty 1

## 2020-08-22 MED ORDER — SODIUM CHLORIDE 0.9 % IV SOLN
20.0000 mg | Freq: Once | INTRAVENOUS | Status: AC
  Administered 2020-08-22: 20 mg via INTRAVENOUS
  Filled 2020-08-22: qty 20

## 2020-08-22 MED ORDER — FAMOTIDINE IN NACL 20-0.9 MG/50ML-% IV SOLN
INTRAVENOUS | Status: AC
  Filled 2020-08-22: qty 50

## 2020-08-22 MED ORDER — DIPHENHYDRAMINE HCL 25 MG PO CAPS
50.0000 mg | ORAL_CAPSULE | Freq: Once | ORAL | Status: AC
Start: 2020-08-22 — End: 2020-08-22
  Administered 2020-08-22: 50 mg via ORAL

## 2020-08-22 NOTE — Patient Instructions (Signed)
[facility] Discharge Instructions for Patients Receiving Chemotherapy  Today you received the following chemotherapy agents: daratumumab.  To help prevent nausea and vomiting after your treatment, we encourage you to take your nausea medication as directed.   If you develop nausea and vomiting that is not controlled by your nausea medication, call the clinic.   BELOW ARE SYMPTOMS THAT SHOULD BE REPORTED IMMEDIATELY:  *FEVER GREATER THAN 100.5 F  *CHILLS WITH OR WITHOUT FEVER  NAUSEA AND VOMITING THAT IS NOT CONTROLLED WITH YOUR NAUSEA MEDICATION  *UNUSUAL SHORTNESS OF BREATH  *UNUSUAL BRUISING OR BLEEDING  TENDERNESS IN MOUTH AND THROAT WITH OR WITHOUT PRESENCE OF ULCERS  *URINARY PROBLEMS  *BOWEL PROBLEMS  UNUSUAL RASH Items with * indicate a potential emergency and should be followed up as soon as possible.  Feel free to call the clinic should you have any questions or concerns. The clinic phone number is [phone].  Please show the [place] at check-in to the Emergency Department and triage nurse.

## 2020-08-23 LAB — MULTIPLE MYELOMA PANEL, SERUM
Albumin SerPl Elph-Mcnc: 3.7 g/dL (ref 2.9–4.4)
Albumin/Glob SerPl: 1.1 (ref 0.7–1.7)
Alpha 1: 0.2 g/dL (ref 0.0–0.4)
Alpha2 Glob SerPl Elph-Mcnc: 1 g/dL (ref 0.4–1.0)
B-Globulin SerPl Elph-Mcnc: 1.3 g/dL (ref 0.7–1.3)
Gamma Glob SerPl Elph-Mcnc: 1.1 g/dL (ref 0.4–1.8)
Globulin, Total: 3.6 g/dL (ref 2.2–3.9)
IgA: 693 mg/dL — ABNORMAL HIGH (ref 61–437)
IgG (Immunoglobin G), Serum: 727 mg/dL (ref 603–1613)
IgM (Immunoglobulin M), Srm: 38 mg/dL (ref 15–143)
M Protein SerPl Elph-Mcnc: 0.4 g/dL — ABNORMAL HIGH
Total Protein ELP: 7.3 g/dL (ref 6.0–8.5)

## 2020-08-31 DIAGNOSIS — Z23 Encounter for immunization: Secondary | ICD-10-CM | POA: Diagnosis not present

## 2020-09-05 DIAGNOSIS — B351 Tinea unguium: Secondary | ICD-10-CM | POA: Diagnosis not present

## 2020-09-05 DIAGNOSIS — M79675 Pain in left toe(s): Secondary | ICD-10-CM | POA: Diagnosis not present

## 2020-09-05 DIAGNOSIS — M79674 Pain in right toe(s): Secondary | ICD-10-CM | POA: Diagnosis not present

## 2020-09-18 NOTE — Progress Notes (Signed)
HEMATOLOGY/ONCOLOGY CLINIC NOTE  Date of Service: 09/19/2020  Patient Care Team: [name], MD as PCP - General (Family Medicine) [name], MD (Cardiology) [name], MD (Cardiology) [name], DO as Consulting Physician (Neurology)  CHIEF COMPLAINTS/PURPOSE OF CONSULTATION:  Continue mx of myeloma  HISTORY OF PRESENTING ILLNESS:   Jonathon [name] is a wonderful 85 y.o. male who has been referred to [place] by Dr. [name] for evaluation and management of Lytic bone lesions. He is accompanied today by his daughter. The pt reports that he is doing well overall. He is currently a resident at [facility] in [place], [place].   The pt presented to the ED on 03/24/18 with a right distal clavicle fracture, which occurred after trying to get out of bed and pushing himself up with his right arm. He notes that his energy has been noticeably decreased for the last month. He also notes that his lower back has a slight sting when trying to stand up from sitting. He also notes some pain in his lower, right chest wall, which is exacerbated when coughing. The pt has been taking 107m Tramadol which is controlling his pain. He does endorse some weakening of both of his lower extremities in the past 1-2 months, which he attributes somewhat to his recent lower back pain.   The pt denies any bowel abnormalities recently. He notes that he stays very well hydrated, and urinates 4-5 times a night, and does have BPH, and takes Flomax. The pt denies any recent changes in his urination habits.   The pt notes that his appetite decreased for a few months, until he began assisted living. He has lost 9 pounds in the last week.   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 He now uses a walker to ambulate. He notes that the CIDP symptoms are primarily located to his legs.  He sees Dr. [name] Neurology. He took steroids without any symptomatic relief. IVIG was cost-prohibitive for the patient, but was recommended.    Regarding the patient's anemia in April 2019, he denies any obvious bleeding at that time, and denies surgeries.    The pt notes that his last colonoscopy, in 2015, was not concerning, which is corroborated by chart review.   The pt and daughter deny any concerns for memory issues, and the daughter adds his memory is "elephant-like". They are not sure why Namenda is on his medication list.   The pt denies any recent dental procedures, or concerns for dental issues. He has maintained every 6 month dental cleanings for all of his life.   Of note prior to the patient's visit today, pt has had a CT C/A/P completed on 03/31/18 with results revealing IMPRESSION: 1. Lytic lesion throughout all visualized vertebra from the lower cervical spine through the sacrum. Epidural extension of tumor T2, T3 and L2 level. Right L3-4 foraminal extension tumor. 2. Lytic lesions involving majority of ribs. Multiple pathologic rib fractures with bony expansion/sub pleural extension of tumor at several levels. 3. Lytic lesions of the scapula, hips bilaterally and pelvis bilaterally. With further progression of tumor, patient may be at risk for pathologic fractures. Pathologic fracture right clavicle incompletely assessed. 4. Circumferential narrowing sigmoid colon. Question possibility of underlying mass (series 2, image 97). Alternatively this could represent muscular hypertrophy/peristalsis. 5. Gastric  antrum circumferential narrowing. Cannot exclude mass although this may be related to peristalsis. 6. No primary lung malignancy noted. 7. Matted aortic pulmonary window lymph nodes. 8. Circumferential urinary bladder wall thickening greater anterior dome region. Right lateral bladder diverticulum. 9. 5 mm low-density lesion pancreatic body (series 5, image 10) unchanged.  This is felt unlikely to be a primary malignancy. 10. Right parapelvic 2.2 cm cyst without significant change. Scattered low-density renal lesions bila2terally too small to adequately characterize. Some were present previously. Statistically these are likely cysts. 11. Stable adrenal gland hyperplasia. 12. Gynecomastia. 13. Prostate gland causes slight impression upon the bladder base. 14.  Aortic Atherosclerosis. 15. Sequential pacemaker in place. 16. Gallstones.  Most recent lab results (03/25/18) of CBC w/diff and CMP is as follows: all values are WNL except for RBC at 4.14, HGB at 11.3, HCT at 33.6, Chloride at 94, CO2 at 33, Glucose at 100, BUN at 25, Total Protein at 9.4, Albumin at 3.4, Calcium at 13.8, GFR at 58. 03/25/18 PSA, Total was normal at 1.2  On review of systems, pt reports positional lower back pain, lower right chest wall pain, recently decreased energy levels, relatively weaker appetite, weight loss, right clavicle pain, weakening of the lower extremities, moving his bowels well, and denies changes in urination, abdominal pains, changes in breathing, changes in bowel habits, pain along the spine, leg swelling, and any other symptoms.  INTERVAL HISTORY:   GONSALO CUTHBERTSON returns today for management and evaluation of his Multiple Myeloma. The patient's last visit with [place] was on 08/07/2020. The pt reports that he is doing well overall.  The pt reports no new symptoms or concerns. The pt notes his urination frequency during the night is improving, from four to now two times per night. The pt has been eating and drinking well. The pt notes that he was never called about the Evusheld. The pt notes that it is his daughter's number on the chart and they may have called her and she forgot to tell him. The pt notes that he may be needing a dental procedure in the coming months, but will keep [place] updated.  Lab results today 09/19/2020 of CBC w/diff and CMP is as follows: all values are WNL except  for Glucose of 106. 09/19/2020 MMP in progress.  On review of systems, the pt reports dental issues and denies fevers, chills, infection issues, SOB, abdominal pain, changes in bowel habits, sudden weight loss, and any other symptoms.    MEDICAL HISTORY:  Past Medical History:  Diagnosis Date  . Asthma   . BENIGN PROSTATIC HYPERTROPHY, HX OF 10/25/2008  . Bone metastases ([place]) 04/14/2018  . BRADYCARDIA 2005  . Chronic diastolic congestive heart failure ([place]) 06/05/2016  . CIDP (chronic inflammatory demyelinating polyneuropathy) ([place])   . HYPERTENSION 11/21/2006  . Iron deficiency anemia, unspecified 04/19/2013  . Multiple myeloma ([place]) 04/29/2018  . 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 PACEMAKER, PERMANENT 2005   Gen change 2014 Medtronic Adaptic L dual-chamber pacemaker, serial [id number]   . SVT (supraventricular tachycardia) ([place])   . TOBACCO ABUSE 10/25/2008   Quit 2012    SURGICAL HISTORY: Past Surgical History:  Procedure Laterality Date  . COLONOSCOPY  [id number]  . PACEMAKER INSERTION  2005  . PERMANENT PACEMAKER GENERATOR CHANGE N/A 06/19/2012   Procedure: PERMANENT PACEMAKER GENERATOR CHANGE;  Surgeon: [name], MD; Medtronic Adaptic L dual-chamber pacemaker, serial #[id number]    . SKIN GRAFT Right 1962   wrist  SOCIAL HISTORY: Social History   Socioeconomic History  . Marital status: Divorced    Spouse name: Not on file  . Number of children: 4  . Years of education: Not on file  . Highest education level: Not on file  Occupational History  . Occupation: ENVIROMENTAL Location manager: [employer]  . Occupation: retired  Tobacco Use  . Smoking status: Former Smoker    Packs/day: 0.25    Years: 46.00    Pack years: 11.50    Types: Cigarettes    Start date: 03/16/1965    Quit date: 06/15/2010    Years since quitting: 10.2  . Smokeless tobacco: Never Used  . Tobacco comment: quit 3 years ago  Vaping Use  . Vaping Use: Never  used  Substance and Sexual Activity  . Alcohol use: No    Alcohol/week: 0.0 standard drinks  . Drug use: No  . Sexual activity: Not on file  Other Topics Concern  . Not on file  Social History Narrative   Has relocated from [place] in 2007. 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  Patient in a facility thru [facility]   Social Determinants of Health   Financial Resource Strain: Not on file  Food Insecurity: Not on file  Transportation Needs: Not on file  Physical Activity: Not on file  Stress: Not on file  Social Connections: Not on file  Intimate Partner Violence: Not on file    FAMILY HISTORY: Family History  Problem Relation Age of Onset  . Heart attack Father        Died, 21  . Kidney disease Father   . Parkinson's disease Mother        Died, 51  . Breast cancer Sister        Living, 53  . Diabetes type II Brother        Living, 61  . Breast cancer Sister        Living, 59  . Diabetes Daughter        Living, 51  . Diabetes Son        Living, 65  . Ovarian cancer Daughter   . Colon cancer Neg Hx   . Rectal cancer Neg Hx   . Stomach cancer Neg Hx     ALLERGIES:  is allergic to lexapro [escitalopram oxalate].  MEDICATIONS:  Current Outpatient Medications  Medication Sig Dispense Refill  . acyclovir (ZOVIRAX) 400 MG tablet Take 1 tablet (400 mg total) by mouth 2 (two) times daily. 60 tablet 11  . aspirin EC 81 MG tablet Take 81 mg by mouth daily.    Marland Kitchen diltiazem (TIAZAC) 240 MG 24 hr capsule Take 1 capsule by mouth daily.    . flecainide (TAMBOCOR) 50 MG tablet Take 75 mg by mouth daily as needed.    . furosemide (LASIX) 40 MG tablet Take 40 mg by mouth daily.    . irbesartan (AVAPRO) 300 MG tablet Take 300 mg by mouth daily.    . memantine (NAMENDA) 5 MG tablet Take 5 mg by mouth 2 (two) times daily.    . metoprolol tartrate (LOPRESSOR) 50 MG tablet Take 1 tablet (50 mg total) by mouth 2 (two) times daily. 60 tablet 0  . ondansetron (ZOFRAN) 8 MG tablet Take 1 tablet (8 mg total) by  mouth 2 (two) times daily as needed (Nausea or vomiting). 30 tablet 1  . polyethylene glycol (MIRALAX / GLYCOLAX) packet Take 17 g by mouth daily. 14 each 0  .  prochlorperazine (COMPAZINE) 10 MG tablet Take 1 tablet (10 mg total) by mouth every 6 (six) hours as needed (Nausea or vomiting). 30 tablet 1  . Saccharomyces boulardii (FLORASTOR PO) Take 250 mg by mouth 2 (two) times daily.    . tamsulosin (FLOMAX) 0.4 MG CAPS capsule Take 2 capsules (0.8 mg total) by mouth daily. 90 capsule 0   No current facility-administered medications for this visit.   Facility-Administered Medications Ordered in Other Visits  Medication Dose Route Frequency Provider Last Rate Last Admin  . [MAR Hold] sodium chloride flush (NS) 0.9 % injection 10 mL  10 mL Intracatheter PRN Brunetta Genera, MD        REVIEW OF SYSTEMS:   10 Point review of Systems was done is negative except as noted above.  PHYSICAL EXAMINATION: ECOG PERFORMANCE STATUS: 2 - Symptomatic, <50% confined to bed  .BP (!) 152/87 (BP Location: Right Arm, Patient Position: Sitting)   Pulse 73   Temp 97.8 F (36.6 C) (Tympanic)   Resp 20   Ht _0  (1.803 m)   Wt 214 lb 9.6 oz (97.3 kg)   SpO2 100%   BMI 29.93 kg/m    GENERAL:alert, in no acute distress and comfortable SKIN: no acute rashes, no significant lesions EYES: conjunctiva are pink and non-injected, sclera anicteric OROPHARYNX: MMM, no exudates, no oropharyngeal erythema or ulceration NECK: supple, no JVD LYMPH:  no palpable lymphadenopathy in the cervical, axillary or inguinal regions LUNGS: clear to auscultation b/l with normal respiratory effort HEART: regular rate & rhythm ABDOMEN:  normoactive bowel sounds , non tender, not distended. Extremity: no pedal edema PSYCH: alert & oriented x 3 with fluent speech NEURO: no focal motor/sensory deficits   LABORATORY DATA:  I have reviewed the data as listed  CBC Latest Ref Rng & Units 09/19/2020 08/22/2020 08/02/2020   WBC 4.0 - 10.5 K/uL 5.8 6.1 6.4  Hemoglobin 13.0 - 17.0 g/dL 13.0 13.0 12.5(L)  Hematocrit 39.0 - 52.0 % 39.1 39.8 38.3(L)  Platelets 150 - 400 K/uL 198 163 155    CMP Latest Ref Rng & Units 09/19/2020 08/22/2020 08/02/2020  Glucose 70 - 99 mg/dL 106(H) 104(H) 90  BUN 8 - 23 mg/dL _1 Creatinine 0.61 - 1.24 mg/dL 1.08 1.16 1.17  Sodium 135 - 145 mmol/L 137 138 142  Potassium 3.5 - 5.1 mmol/L 4.4 4.4 4.4  Chloride 98 - 111 mmol/L 101 104 105  CO2 22 - 32 mmol/L _2 Calcium 8.9 - 10.3 mg/dL 9.1 9.1 8.6(L)  Total Protein 6.5 - 8.1 g/dL 7.2 7.5 7.0  Total Bilirubin 0.3 - 1.2 mg/dL 0.6 0.7 0.5  Alkaline Phos 38 - 126 U/L 73 72 65  AST 15 - 41 U/L _3 ALT 0 - 44 U/L _4 12/14/2018 MMP    12/14/2018 MMP    04/17/18 BM Bx:    RADIOGRAPHIC STUDIES: I have personally reviewed the radiological images as listed and agreed with the findings in the report. No results found.  ASSESSMENT & PLAN:  85 y.o. male with  1. Lytic bone lesions - w/u consistent with newly diagnosed multiple myeloma Labs upon initial presentation from 04/14/18; hypercalcemic with Calcium at 13.8, renal function up form baseline with Creatinine at 1.48, HGB at 11.3. PSA was normal.   03/31/18 CT C/A/P revealed 1. Lytic lesion throughout all visualized vertebra from the lower cervical spine through the sacrum. Epidural extension of tumor T2, T3 and  L2 level. Right L3-4 foraminal extension tumor. 2. Lytic lesions involving majority of ribs. Multiple pathologic rib fractures with bony expansion/sub pleural extension of tumor at several levels. 3. Lytic lesions of the scapula, hips bilaterally and pelvis bilaterally. With further progression of tumor, patient may be at risk for pathologic fractures. Pathologic fracture right clavicle incompletely assessed. 4. Circumferential narrowing sigmoid colon. Question possibility of underlying mass (series 2, image 97). Alternatively this could represent  muscular hypertrophy/peristalsis. 5. Gastric antrum circumferential narrowing. Cannot exclude mass although this may be related to peristalsis. 6. No primary lung malignancy noted. 7. Matted aortic pulmonary window lymph nodes. 8. Circumferential urinary bladder wall thickening greater anterior dome region. Right lateral bladder diverticulum. 9. 5 mm low-density lesion pancreatic body (series 5, image 10) unchanged. This is felt unlikely to be a primary malignancy. 10. Right parapelvic 2.2 cm cyst without significant change. Scattered low-density renal lesions bilaterally too small to adequately characterize. Some were present previously. Statistically these are likely cysts. 11. Stable adrenal gland hyperplasia. 12. Gynecomastia. 13. Prostate gland causes slight impression upon the bladder base. 14.  Aortic Atherosclerosis. 15. Sequential pacemaker in place. 16. Gallstones.   04/23/18 PET/CT revealed Innumerable hypermetabolic lytic lesions throughout the skeleton especially involving the spine, ribs, bony pelvis along with some involvement of both proximal femurs. The appearance is compatible with pathologic diagnosis of plasma cell neoplasm could well reflect multiple myeloma. Resulting pathologic fractures of the right lateral clavicle and of multiple bilateral ribs. Lesions are present along the cortical margins of the spinal canal. 2. Both the area and the stomach in the area in the sigmoid colon drawn attention to on prior CT scan appear benign normal today. 3. The confluence of AP window lymph nodes measures about 1.2 cm in short axis with maximum SUV 3.1, which is mildly above the blood pool merit surveillance. 4. Other imaging findings of potential clinical significance: Aortic Atherosclerosis. Coronary atherosclerosis. Pacemaker noted. Borderline prostatomegaly. Cholelithiasis.  04/21/18 BM Bx revealed Normocellular bone marrow with Plasma Cell neoplasm. Scattered medium sized clusters of  kappa-restricted plasma cells (14% aspirate, 10% CD138 immunohistochemistry.   I do suspect that the burden of disease is underestimated in the bone marrow sample given PET/CT findings of innumerable lytic lesions and an M spike of 3.4g   06/08/18 DG Hips bilat which revealed Innumerable lucencies are noted throughout the pelvis and proximal femurs, similar findings noted on prior PET-CT of 04/23/2018. Findings are consistent patient's known multiple myeloma. Femoral necks are intact. 2.  Aortoiliac atherosclerotic vascular disease.  2. Pathologic right clavicle fracture due to myeloma- resolved 3. Hypercalcemia- resolved.   PLAN:    -Discussed pt labwork today, 09/19/2020; blood counts completely normal, chemistries normal. -Discussed pt MMP, 08/22/2020; m-protein continues to increase slightly. Now at 0.4. -Advised pt that we will monitor his myeloma panel from labs today and will see if it keeps increasing gradually. If so, we will switch Daratumumab from q4weeks to q2weeks. -Discussed Evusheld and pt's eligibility. Will send referral again. -The pt has no prohibitive toxicities from continuing q4weeks Daratumumab at this time. -Continue OTC Vitamin D. -Hold Xgeva at this time until dental issues are resolved. -Continue stationary bike and physical activity daily.  -Will see back in in 2 months with labs.   FOLLOW UP: Please schedule next 3 cycles of monthly daratumumab with labs MD visit in 2 months Xgeva currently on hold pending dental procedure Referral for Evusheld   The total time spent in the appointment was 30 minutes  and more than 50% was on counseling and direct patient cares, ordering and mx of immunotherapy   All of the patient's questions were answered with apparent satisfaction. The patient knows to call the clinic with any problems, questions or concerns.    Sullivan Lone MD [place] AAHIVMS [facility] Hematology/Oncology Physician [facility]  (Office):        [phone] (Work cell):  [phone] (Fax):           [phone]  09/19/2020 9:47 AM  I, Reinaldo Raddle, am acting as scribe for Dr. Sullivan Lone, MD.   .I have reviewed the above documentation for accuracy and completeness, and I agree with the above. Brunetta Genera MD

## 2020-09-19 ENCOUNTER — Inpatient Hospital Stay: Payer: Medicare Other | Attending: Hematology

## 2020-09-19 ENCOUNTER — Inpatient Hospital Stay (HOSPITAL_BASED_OUTPATIENT_CLINIC_OR_DEPARTMENT_OTHER): Payer: Medicare Other | Admitting: Hematology

## 2020-09-19 ENCOUNTER — Other Ambulatory Visit: Payer: Self-pay

## 2020-09-19 ENCOUNTER — Inpatient Hospital Stay: Payer: Medicare Other

## 2020-09-19 VITALS — BP 152/87 | HR 73 | Temp 97.8°F | Resp 20 | Ht 71.0 in | Wt 214.6 lb

## 2020-09-19 VITALS — BP 147/76 | HR 65 | Temp 97.8°F | Resp 22

## 2020-09-19 DIAGNOSIS — Z7982 Long term (current) use of aspirin: Secondary | ICD-10-CM | POA: Insufficient documentation

## 2020-09-19 DIAGNOSIS — C9001 Multiple myeloma in remission: Secondary | ICD-10-CM

## 2020-09-19 DIAGNOSIS — I110 Hypertensive heart disease with heart failure: Secondary | ICD-10-CM | POA: Diagnosis not present

## 2020-09-19 DIAGNOSIS — Z79899 Other long term (current) drug therapy: Secondary | ICD-10-CM | POA: Diagnosis not present

## 2020-09-19 DIAGNOSIS — C9000 Multiple myeloma not having achieved remission: Secondary | ICD-10-CM | POA: Diagnosis not present

## 2020-09-19 DIAGNOSIS — N400 Enlarged prostate without lower urinary tract symptoms: Secondary | ICD-10-CM | POA: Insufficient documentation

## 2020-09-19 DIAGNOSIS — Z5112 Encounter for antineoplastic immunotherapy: Secondary | ICD-10-CM

## 2020-09-19 DIAGNOSIS — Z7189 Other specified counseling: Secondary | ICD-10-CM

## 2020-09-19 DIAGNOSIS — C9002 Multiple myeloma in relapse: Secondary | ICD-10-CM | POA: Diagnosis not present

## 2020-09-19 DIAGNOSIS — I5032 Chronic diastolic (congestive) heart failure: Secondary | ICD-10-CM | POA: Diagnosis not present

## 2020-09-19 DIAGNOSIS — Z87891 Personal history of nicotine dependence: Secondary | ICD-10-CM | POA: Diagnosis not present

## 2020-09-19 DIAGNOSIS — I471 Supraventricular tachycardia: Secondary | ICD-10-CM | POA: Insufficient documentation

## 2020-09-19 LAB — CMP (CANCER CENTER ONLY)
ALT: 6 U/L (ref 0–44)
AST: 16 U/L (ref 15–41)
Albumin: 3.7 g/dL (ref 3.5–5.0)
Alkaline Phosphatase: 73 U/L (ref 38–126)
Anion gap: 11 (ref 5–15)
BUN: 13 mg/dL (ref 8–23)
CO2: 25 mmol/L (ref 22–32)
Calcium: 9.1 mg/dL (ref 8.9–10.3)
Chloride: 101 mmol/L (ref 98–111)
Creatinine: 1.08 mg/dL (ref 0.61–1.24)
GFR, Estimated: >60 mL/min (ref >60)
Glucose, Bld: 106 mg/dL — ABNORMAL HIGH (ref 70–99)
Potassium: 4.4 mmol/L (ref 3.5–5.1)
Sodium: 137 mmol/L (ref 135–145)
Total Bilirubin: 0.6 mg/dL (ref 0.3–1.2)
Total Protein: 7.2 g/dL (ref 6.5–8.1)

## 2020-09-19 LAB — CBC WITH DIFFERENTIAL/PLATELET
Abs Immature Granulocytes: 0.01 10*3/uL (ref 0.00–0.07)
Basophils Absolute: 0.1 10*3/uL (ref 0.0–0.1)
Basophils Relative: 1 %
Eosinophils Absolute: 0.5 10*3/uL (ref 0.0–0.5)
Eosinophils Relative: 8 %
HCT: 39.1 % (ref 39.0–52.0)
Hemoglobin: 13 g/dL (ref 13.0–17.0)
Immature Granulocytes: 0 %
Lymphocytes Relative: 45 %
Lymphs Abs: 2.6 10*3/uL (ref 0.7–4.0)
MCH: 27.9 pg (ref 26.0–34.0)
MCHC: 33.2 g/dL (ref 30.0–36.0)
MCV: 83.9 fL (ref 80.0–100.0)
Monocytes Absolute: 0.6 10*3/uL (ref 0.1–1.0)
Monocytes Relative: 10 %
Neutro Abs: 2.1 10*3/uL (ref 1.7–7.7)
Neutrophils Relative %: 36 %
Platelets: 198 10*3/uL (ref 150–400)
RBC: 4.66 MIL/uL (ref 4.22–5.81)
RDW: 14 % (ref 11.5–15.5)
WBC: 5.8 10*3/uL (ref 4.0–10.5)
nRBC: 0 % (ref 0.0–0.2)

## 2020-09-19 MED ORDER — DIPHENHYDRAMINE HCL 25 MG PO CAPS
ORAL_CAPSULE | ORAL | Status: AC
  Filled 2020-09-19: qty 2

## 2020-09-19 MED ORDER — DEXAMETHASONE SODIUM PHOSPHATE 100 MG/10ML IJ SOLN
20.0000 mg | Freq: Once | INTRAMUSCULAR | Status: AC
  Administered 2020-09-19: 20 mg via INTRAVENOUS
  Filled 2020-09-19: qty 20

## 2020-09-19 MED ORDER — DIPHENHYDRAMINE HCL 25 MG PO CAPS
50.0000 mg | ORAL_CAPSULE | Freq: Once | ORAL | Status: AC
  Administered 2020-09-19: 50 mg via ORAL

## 2020-09-19 MED ORDER — MONTELUKAST SODIUM 10 MG PO TABS
10.0000 mg | ORAL_TABLET | Freq: Once | ORAL | Status: AC
  Administered 2020-09-19: 10 mg via ORAL

## 2020-09-19 MED ORDER — MONTELUKAST SODIUM 10 MG PO TABS
ORAL_TABLET | ORAL | Status: AC
  Filled 2020-09-19: qty 1

## 2020-09-19 MED ORDER — ACETAMINOPHEN 325 MG PO TABS
650.0000 mg | ORAL_TABLET | Freq: Once | ORAL | Status: AC
Start: 2020-09-19 — End: 2020-09-19
  Administered 2020-09-19: 650 mg via ORAL

## 2020-09-19 MED ORDER — SODIUM CHLORIDE 0.9 % IV SOLN
Freq: Once | INTRAVENOUS | Status: AC
  Filled 2020-09-19: qty 250

## 2020-09-19 MED ORDER — FAMOTIDINE 20 MG IN NS 100 ML IVPB
20.0000 mg | Freq: Once | INTRAVENOUS | Status: AC
  Administered 2020-09-19: 20 mg via INTRAVENOUS

## 2020-09-19 MED ORDER — SODIUM CHLORIDE 0.9 % IV SOLN
16.0000 mg/kg | Freq: Once | INTRAVENOUS | Status: AC
  Administered 2020-09-19: 1600 mg via INTRAVENOUS
  Filled 2020-09-19: qty 80

## 2020-09-19 MED ORDER — FAMOTIDINE 20 MG IN NS 100 ML IVPB
INTRAVENOUS | Status: AC
  Filled 2020-09-19: qty 100

## 2020-09-19 MED ORDER — ACETAMINOPHEN 325 MG PO TABS
ORAL_TABLET | ORAL | Status: AC
  Filled 2020-09-19: qty 2

## 2020-09-19 NOTE — Patient Instructions (Signed)
[facility] MEDICAL ONCOLOGY   Discharge Instructions: Thank you for choosing [facility] to provide your oncology and hematology care.   If you have a lab appointment with the Cancer Center, please go directly to the Cancer Center and check in at the registration area.   Wear comfortable clothing and clothing appropriate for easy access to any Portacath or PICC line.   We strive to give you quality time with your provider. You may need to reschedule your appointment if you arrive late (15 or more minutes).  Arriving late affects you and other patients whose appointments are after yours.  Also, if you miss three or more appointments without notifying the office, you may be dismissed from the clinic at the provider's discretion.      For prescription refill requests, have your pharmacy contact our office and allow 72 hours for refills to be completed.    Today you received the following chemotherapy and/or immunotherapy agents: daratumumab      To help prevent nausea and vomiting after your treatment, we encourage you to take your nausea medication as directed.  BELOW ARE SYMPTOMS THAT SHOULD BE REPORTED IMMEDIATELY: *FEVER GREATER THAN 100.4 F (38 C) OR HIGHER *CHILLS OR SWEATING *NAUSEA AND VOMITING THAT IS NOT CONTROLLED WITH YOUR NAUSEA MEDICATION *UNUSUAL SHORTNESS OF BREATH *UNUSUAL BRUISING OR BLEEDING *URINARY PROBLEMS (pain or burning when urinating, or frequent urination) *BOWEL PROBLEMS (unusual diarrhea, constipation, pain near the anus) TENDERNESS IN MOUTH AND THROAT WITH OR WITHOUT PRESENCE OF ULCERS (sore throat, sores in mouth, or a toothache) UNUSUAL RASH, SWELLING OR PAIN  UNUSUAL VAGINAL DISCHARGE OR ITCHING   Items with * indicate a potential emergency and should be followed up as soon as possible or go to the Emergency Department if any problems should occur.  Please show the CHEMOTHERAPY ALERT CARD or IMMUNOTHERAPY ALERT CARD at check-in  to the Emergency Department and triage nurse.  Should you have questions after your visit or need to cancel or reschedule your appointment, please contact [facility] MEDICAL ONCOLOG[facility]  Dept: [phone]  and follow the prompts.  Office hours are 8:00 a.m. to 4:30 p.m. Monday - Friday. Please note that voicemails left after 4:00 p.m. may not be returned until the following business day.  We are closed weekends and major holidays. You have access to a nurse at all times for urgent questions. Please call the main number to the clinic Dept: [phone] and follow the prompts.   For any non-urgent questions, you may also contact your provider using MyChart. We now offer e-Visits for anyone 18 and older to request care online for non-urgent symptoms. For details visit [email or web address].   Also download the MyChart app! Go to the app store, search "MyChart", open the app, select , and log in with your MyChart username and password.  Due to Covid, a mask is required upon entering the hospital/clinic. If you do not have a mask, one will be given to you upon arrival. For doctor visits, patients may have 1 support person aged 18 or older with them. For treatment visits, patients cannot have anyone with them due to current Covid guidelines and our immunocompromised population.   

## 2020-09-21 ENCOUNTER — Telehealth: Payer: Self-pay

## 2020-09-21 ENCOUNTER — Telehealth: Payer: Self-pay | Admitting: Hematology

## 2020-09-21 NOTE — Telephone Encounter (Signed)
Left message for pt's daughter:   Jonathon [name] currently on hold pending dental procedure   Pt will continue with his regular infusion appointments but needs to [name] the dentist prior to resuming Jonathon [name],   Instructed to call this office for questions.

## 2020-09-21 NOTE — Telephone Encounter (Signed)
Left message with cancelled upcoming appointments per 5/10 los. Gave option to call back with questions if needed.

## 2020-09-22 ENCOUNTER — Ambulatory Visit: Payer: Medicare Other

## 2020-09-22 ENCOUNTER — Other Ambulatory Visit: Payer: Medicare Other

## 2020-09-25 ENCOUNTER — Other Ambulatory Visit: Payer: Self-pay | Admitting: Adult Health

## 2020-09-25 DIAGNOSIS — Z20828 Contact with and (suspected) exposure to other viral communicable diseases: Secondary | ICD-10-CM | POA: Diagnosis not present

## 2020-09-25 DIAGNOSIS — C9001 Multiple myeloma in remission: Secondary | ICD-10-CM

## 2020-09-25 LAB — MULTIPLE MYELOMA PANEL, SERUM
Albumin SerPl Elph-Mcnc: 3.6 g/dL (ref 2.9–4.4)
Albumin/Glob SerPl: 1.2 (ref 0.7–1.7)
Alpha 1: 0.2 g/dL (ref 0.0–0.4)
Alpha2 Glob SerPl Elph-Mcnc: 0.9 g/dL (ref 0.4–1.0)
B-Globulin SerPl Elph-Mcnc: 1.1 g/dL (ref 0.7–1.3)
Gamma Glob SerPl Elph-Mcnc: 1.1 g/dL (ref 0.4–1.8)
Globulin, Total: 3.2 g/dL (ref 2.2–3.9)
IgA: 759 mg/dL — ABNORMAL HIGH (ref 61–437)
IgG (Immunoglobin G), Serum: 660 mg/dL (ref 603–1613)
IgM (Immunoglobulin M), Srm: 31 mg/dL (ref 15–143)
M Protein SerPl Elph-Mcnc: 0.4 g/dL — ABNORMAL HIGH
Total Protein ELP: 6.8 g/dL (ref 6.0–8.5)

## 2020-09-25 NOTE — Progress Notes (Signed)
I connected by phone with Jonathon [name] on 09/25/2020, 1:43 PM to discuss the potential use of a new treatment, tixagevimab/cilgavimab, for pre-exposure prophylaxis for prevention of coronavirus disease 2019 (COVID-19) caused by the SARS-CoV-2 virus.  This patient is a 85 y.o. male that meets the FDA criteria for Emergency Use Authorization of tixagevimab/cilgavimab for pre-exposure prophylaxis of COVID-19 disease. Pt meets following criteria:  Age >12 yr and weight > 40kg  Not currently infected with SARS-CoV-2 and has no known recent exposure to an individual infected with SARS-CoV-2 AND o Who has moderate to severe immune compromise due to a medical condition or receipt of immunosuppressive medications or treatments and may not mount an adequate immune response to COVID-19 vaccination or  o Vaccination with any available COVID-19 vaccine, according to the approved or authorized schedule, is not recommended due to a history of severe adverse reaction (e.g., severe allergic reaction) to a COVID-19 vaccine(s) and/or COVID-19 vaccine component(s).  o Patient meets the following definition of mod-severe immune compromised status: 4. Lung transplants, other solid organ transplant recipients on continual belatacept therapy, or multiple myeloma (actively receiving treatment)  I have spoken and communicated the following to the patient or parent/caregiver regarding COVID monoclonal antibody treatment:  1. FDA has authorized the emergency use of tixagevimab/cilgavimab for the pre-exposure prophylaxis of COVID-19 in patients with moderate-severe immunocompromised status, who meet above EUA criteria.  2. The significant known and potential risks and benefits of COVID monoclonal antibody, and the extent to which such potential risks and benefits are unknown.  3. Information on available alternative treatments and the risks and benefits of those alternatives, including clinical trials.  4. The patient or  parent/caregiver has the option to accept or refuse COVID monoclonal antibody treatment.  After reviewing this information with the patient's HCPOA and legal guardia she agree to receive tixagevimab/cilgavimab  Scot Dock, NP, 09/25/2020, 1:43 PM

## 2020-09-29 ENCOUNTER — Ambulatory Visit: Payer: Medicare Other

## 2020-09-29 ENCOUNTER — Other Ambulatory Visit: Payer: Medicare Other

## 2020-10-04 ENCOUNTER — Ambulatory Visit (INDEPENDENT_AMBULATORY_CARE_PROVIDER_SITE_OTHER): Payer: Medicare Other

## 2020-10-04 DIAGNOSIS — R001 Bradycardia, unspecified: Secondary | ICD-10-CM

## 2020-10-05 LAB — CUP PACEART REMOTE DEVICE CHECK
Battery Impedance: 1115 Ohm
Battery Remaining Longevity: 58 mo
Battery Voltage: 2.75 V
Brady Statistic AP VP Percent: 24 %
Brady Statistic AP VS Percent: 0 %
Brady Statistic AS VP Percent: 64 %
Brady Statistic AS VS Percent: 12 %
Date Time Interrogation Session: 20220525093057
Implantable Lead Implant Date: 20050121
Implantable Lead Implant Date: 20050121
Implantable Lead Location: 753859
Implantable Lead Location: 753860
Implantable Lead Model: 5076
Implantable Lead Model: 5076
Implantable Pulse Generator Implant Date: 20140207
Lead Channel Impedance Value: 550 Ohm
Lead Channel Impedance Value: 624 Ohm
Lead Channel Pacing Threshold Amplitude: 0.5 V
Lead Channel Pacing Threshold Amplitude: 1.125 V
Lead Channel Pacing Threshold Pulse Width: 0.4 ms
Lead Channel Pacing Threshold Pulse Width: 0.4 ms
Lead Channel Setting Pacing Amplitude: 2.25 V
Lead Channel Setting Pacing Amplitude: 2.5 V
Lead Channel Setting Pacing Pulse Width: 0.4 ms
Lead Channel Setting Sensing Sensitivity: 2 mV

## 2020-10-17 ENCOUNTER — Other Ambulatory Visit: Payer: Self-pay

## 2020-10-17 ENCOUNTER — Inpatient Hospital Stay: Payer: Medicare Other

## 2020-10-17 ENCOUNTER — Inpatient Hospital Stay: Payer: Medicare Other | Attending: Hematology

## 2020-10-17 VITALS — BP 164/86 | HR 70 | Temp 97.5°F | Resp 16 | Wt 204.5 lb

## 2020-10-17 DIAGNOSIS — C9001 Multiple myeloma in remission: Secondary | ICD-10-CM

## 2020-10-17 DIAGNOSIS — Z7189 Other specified counseling: Secondary | ICD-10-CM

## 2020-10-17 DIAGNOSIS — Z5112 Encounter for antineoplastic immunotherapy: Secondary | ICD-10-CM | POA: Insufficient documentation

## 2020-10-17 DIAGNOSIS — C9000 Multiple myeloma not having achieved remission: Secondary | ICD-10-CM | POA: Diagnosis not present

## 2020-10-17 LAB — CMP (CANCER CENTER ONLY)
ALT: 7 U/L (ref 0–44)
AST: 15 U/L (ref 15–41)
Albumin: 3.5 g/dL (ref 3.5–5.0)
Alkaline Phosphatase: 72 U/L (ref 38–126)
Anion gap: 10 (ref 5–15)
BUN: 17 mg/dL (ref 8–23)
CO2: 25 mmol/L (ref 22–32)
Calcium: 9.6 mg/dL (ref 8.9–10.3)
Chloride: 100 mmol/L (ref 98–111)
Creatinine: 1.03 mg/dL (ref 0.61–1.24)
GFR, Estimated: >60 mL/min (ref >60)
Glucose, Bld: 97 mg/dL (ref 70–99)
Potassium: 4.4 mmol/L (ref 3.5–5.1)
Sodium: 135 mmol/L (ref 135–145)
Total Bilirubin: 0.7 mg/dL (ref 0.3–1.2)
Total Protein: 7.3 g/dL (ref 6.5–8.1)

## 2020-10-17 LAB — CBC WITH DIFFERENTIAL/PLATELET
Abs Immature Granulocytes: 0.01 10*3/uL (ref 0.00–0.07)
Basophils Absolute: 0.1 10*3/uL (ref 0.0–0.1)
Basophils Relative: 1 %
Eosinophils Absolute: 0.4 10*3/uL (ref 0.0–0.5)
Eosinophils Relative: 5 %
HCT: 36 % — ABNORMAL LOW (ref 39.0–52.0)
Hemoglobin: 12 g/dL — ABNORMAL LOW (ref 13.0–17.0)
Immature Granulocytes: 0 %
Lymphocytes Relative: 38 %
Lymphs Abs: 2.5 10*3/uL (ref 0.7–4.0)
MCH: 28.2 pg (ref 26.0–34.0)
MCHC: 33.3 g/dL (ref 30.0–36.0)
MCV: 84.5 fL (ref 80.0–100.0)
Monocytes Absolute: 0.6 10*3/uL (ref 0.1–1.0)
Monocytes Relative: 9 %
Neutro Abs: 3.1 10*3/uL (ref 1.7–7.7)
Neutrophils Relative %: 47 %
Platelets: 194 10*3/uL (ref 150–400)
RBC: 4.26 MIL/uL (ref 4.22–5.81)
RDW: 14 % (ref 11.5–15.5)
WBC: 6.5 10*3/uL (ref 4.0–10.5)
nRBC: 0 % (ref 0.0–0.2)

## 2020-10-17 MED ORDER — DEXAMETHASONE SODIUM PHOSPHATE 100 MG/10ML IJ SOLN
20.0000 mg | Freq: Once | INTRAMUSCULAR | Status: AC
  Administered 2020-10-17: 20 mg via INTRAVENOUS
  Filled 2020-10-17: qty 20

## 2020-10-17 MED ORDER — SODIUM CHLORIDE 0.9 % IV SOLN
Freq: Once | INTRAVENOUS | Status: AC
  Filled 2020-10-17: qty 250

## 2020-10-17 MED ORDER — SODIUM CHLORIDE 0.9 % IV SOLN
16.0000 mg/kg | Freq: Once | INTRAVENOUS | Status: AC
  Administered 2020-10-17: 1600 mg via INTRAVENOUS
  Filled 2020-10-17: qty 80

## 2020-10-17 MED ORDER — FAMOTIDINE 20 MG IN NS 100 ML IVPB
INTRAVENOUS | Status: AC
  Filled 2020-10-17: qty 100

## 2020-10-17 MED ORDER — DIPHENHYDRAMINE HCL 25 MG PO CAPS
50.0000 mg | ORAL_CAPSULE | Freq: Once | ORAL | Status: AC
Start: 2020-10-17 — End: 2020-10-17
  Administered 2020-10-17: 50 mg via ORAL

## 2020-10-17 MED ORDER — ACETAMINOPHEN 325 MG PO TABS
650.0000 mg | ORAL_TABLET | Freq: Once | ORAL | Status: AC
  Administered 2020-10-17: 650 mg via ORAL

## 2020-10-17 MED ORDER — ACETAMINOPHEN 325 MG PO TABS
ORAL_TABLET | ORAL | Status: AC
  Filled 2020-10-17: qty 2

## 2020-10-17 MED ORDER — FAMOTIDINE 20 MG IN NS 100 ML IVPB
20.0000 mg | Freq: Once | INTRAVENOUS | Status: AC
  Administered 2020-10-17: 20 mg via INTRAVENOUS

## 2020-10-17 MED ORDER — MONTELUKAST SODIUM 10 MG PO TABS
10.0000 mg | ORAL_TABLET | Freq: Once | ORAL | Status: AC
  Administered 2020-10-17: 10 mg via ORAL

## 2020-10-17 MED ORDER — MONTELUKAST SODIUM 10 MG PO TABS
ORAL_TABLET | ORAL | Status: AC
  Filled 2020-10-17: qty 1

## 2020-10-17 MED ORDER — DIPHENHYDRAMINE HCL 25 MG PO CAPS
ORAL_CAPSULE | ORAL | Status: AC
  Filled 2020-10-17: qty 2

## 2020-10-17 NOTE — Patient Instructions (Signed)
[facility]COL[facility]  Discharge Instructions: °Thank you for choosing [facility] to provide your oncology and hematology care.  ° °If you have a lab appointment with the Cancer Center, please go directly to the Cancer Center and check in at the registration area. °  °Wear comfortable clothing and clothing appropriate for easy access to any Portacath or PICC line.  ° °We strive to give you quality time with your provider. You may need to reschedule your appointment if you arrive late (15 or more minutes).  Arriving late affects you and other patients whose appointments are after yours.  Also, if you miss three or more appointments without notifying the office, you may be dismissed from the clinic at the provider’s discretion.    °  °For prescription refill requests, have your pharmacy contact our office and allow 72 hours for refills to be completed.   ° °Today you received the following chemotherapy and/or immunotherapy agents: Daratumumab.     °  °To help prevent nausea and vomiting after your treatment, we encourage you to take your nausea medication as directed. ° °BELOW ARE SYMPTOMS THAT SHOULD BE REPORTED IMMEDIATELY: °*FEVER GREATER THAN 100.4 F (38 °C) OR HIGHER °*CHILLS OR SWEATING °*NAUSEA AND VOMITING THAT IS NOT CONTROLLED WITH YOUR NAUSEA MEDICATION °*UNUSUAL SHORTNESS OF BREATH °*UNUSUAL BRUISING OR BLEEDING °*URINARY PROBLEMS (pain or burning when urinating, or frequent urination) °*BOWEL PROBLEMS (unusual diarrhea, constipation, pain near the anus) °TENDERNESS IN MOUTH AND THROAT WITH OR WITHOUT PRESENCE OF ULCERS (sore throat, sores in mouth, or a toothache) °UNUSUAL RASH, SWELLING OR PAIN  °UNUSUAL VAGINAL DISCHARGE OR ITCHING  ° °Items with * indicate a potential emergency and should be followed up as soon as possible or go to the Emergency Department if any problems should occur. ° °Please show the CHEMOTHERAPY ALERT CARD or IMMUNOTHERAPY ALERT CARD at check-in  to the Emergency Department and triage nurse. ° °Should you have questions after your visit or need to cancel or reschedule your appointment, please contact [facility]  Dept: [phone]  and follow the prompts.  Office hours are 8:00 a.m. to 4:30 p.m. Monday - Friday. Please note that voicemails left after 4:00 p.m. may not be returned until the following business day.  We are closed weekends and major holidays. You have access to a nurse at all times for urgent questions. Please call the main number to the clinic Dept: [phone] and follow the prompts. ° ° °For any non-urgent questions, you may also contact your provider using MyChart. We now offer e-Visits for anyone 18 and older to request care online for non-urgent symptoms. For details visit [email or web address]. °  °Also download the MyChart app! Go to the app store, search "MyChart", open the app, select [facility], and log in with your MyChart username and password. ° °Due to Covid, a mask is required upon entering the hospital/clinic. If you do not have a mask, one will be given to you upon arrival. For doctor visits, patients may have 1 support person aged 18 or older with them. For treatment visits, patients cannot have anyone with them due to current Covid guidelines and our immunocompromised population.  ° °

## 2020-10-20 LAB — MULTIPLE MYELOMA PANEL, SERUM
Albumin SerPl Elph-Mcnc: 3.5 g/dL (ref 2.9–4.4)
Albumin/Glob SerPl: 1.1 (ref 0.7–1.7)
Alpha 1: 0.2 g/dL (ref 0.0–0.4)
Alpha2 Glob SerPl Elph-Mcnc: 1 g/dL (ref 0.4–1.0)
B-Globulin SerPl Elph-Mcnc: 1.2 g/dL (ref 0.7–1.3)
Gamma Glob SerPl Elph-Mcnc: 0.9 g/dL (ref 0.4–1.8)
Globulin, Total: 3.3 g/dL (ref 2.2–3.9)
IgA: 760 mg/dL — ABNORMAL HIGH (ref 61–437)
IgG (Immunoglobin G), Serum: 563 mg/dL — ABNORMAL LOW (ref 603–1613)
IgM (Immunoglobulin M), Srm: 33 mg/dL (ref 15–143)
M Protein SerPl Elph-Mcnc: 0.3 g/dL — ABNORMAL HIGH
Total Protein ELP: 6.8 g/dL (ref 6.0–8.5)

## 2020-10-26 NOTE — Progress Notes (Signed)
Remote pacemaker transmission.   

## 2020-11-01 DIAGNOSIS — M272 Inflammatory conditions of jaws: Secondary | ICD-10-CM | POA: Diagnosis not present

## 2020-11-10 HISTORY — PX: TOOTH EXTRACTION: SUR596

## 2020-11-13 NOTE — Progress Notes (Signed)
HEMATOLOGY/ONCOLOGY CLINIC NOTE  Date of Service: 11/14/2020  Patient Care Team: [name], MD as PCP - General (Family Medicine) [name], MD (Cardiology) [name], MD (Cardiology) [name], DO as Consulting Physician (Neurology)  CHIEF COMPLAINTS/PURPOSE OF CONSULTATION:  Continue mx of myeloma  HISTORY OF PRESENTING ILLNESS:   Jonathon [name] is a wonderful 85 y.o. male who has been referred to [place] by Dr. [name] for evaluation and management of Lytic bone lesions. He is accompanied today by his daughter. The pt reports that he is doing well overall. He is currently a resident at [facility] in [place], [place].   The pt presented to the ED on 03/24/18 with a right distal clavicle fracture, which occurred after trying to get out of bed and pushing himself up with his right arm. He notes that his energy has been noticeably decreased for the last month. He also notes that his lower back has a slight sting when trying to stand up from sitting. He also notes some pain in his lower, right chest wall, which is exacerbated when coughing. The pt has been taking 19m Tramadol which is controlling his pain. He does endorse some weakening of both of his lower extremities in the past 1-2 months, which he attributes somewhat to his recent lower back pain.   The pt denies any bowel abnormalities recently. He notes that he stays very well hydrated, and urinates 4-5 times a night, and does have BPH, and takes Flomax. The pt denies any recent changes in his urination habits.   The pt notes that his appetite decreased for a few months, until he began assisted living. He has lost 9 pounds in the last week.   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 He now uses a walker to ambulate. He notes that the CIDP symptoms are primarily located to his legs. He  sees Dr. [name] Neurology. He took steroids without any symptomatic relief. IVIG was cost-prohibitive for the patient, but was recommended.    Regarding the patient's anemia in April 2019, he denies any obvious bleeding at that time, and denies surgeries.    The pt notes that his last colonoscopy, in 2015, was not concerning, which is corroborated by chart review.   The pt and daughter deny any concerns for memory issues, and the daughter adds his memory is "elephant-like". They are not sure why Namenda is on his medication list.   The pt denies any recent dental procedures, or concerns for dental issues. He has maintained every 6 month dental cleanings for all of his life.   Of note prior to the patient's visit today, pt has had a CT C/A/P completed on 03/31/18 with results revealing IMPRESSION: 1. Lytic lesion throughout all visualized vertebra from the lower cervical spine through the sacrum. Epidural extension of tumor T2, T3 and L2 level. Right L3-4 foraminal extension tumor. 2. Lytic lesions involving majority of ribs. Multiple pathologic rib fractures with bony expansion/sub pleural extension of tumor at several levels. 3. Lytic lesions of the scapula, hips bilaterally and pelvis bilaterally. With further progression of tumor, patient may be at risk for pathologic fractures. Pathologic fracture right clavicle incompletely assessed. 4. Circumferential narrowing sigmoid colon. Question possibility of underlying mass (series 2, image 97). Alternatively this could represent muscular hypertrophy/peristalsis. 5. Gastric  antrum circumferential narrowing. Cannot exclude mass although this may be related to peristalsis. 6. No primary lung malignancy noted. 7. Matted aortic pulmonary window lymph nodes. 8. Circumferential urinary bladder wall thickening greater anterior dome region. Right lateral bladder diverticulum. 9. 5 mm low-density lesion pancreatic body (series 5, image 10) unchanged.  This is felt unlikely to be a primary malignancy. 10. Right parapelvic 2.2 cm cyst without significant change. Scattered low-density renal lesions bila2terally too small to adequately characterize. Some were present previously. Statistically these are likely cysts. 11. Stable adrenal gland hyperplasia. 12. Gynecomastia. 13. Prostate gland causes slight impression upon the bladder base. 14.  Aortic Atherosclerosis. 15. Sequential pacemaker in place. 16. Gallstones.  Most recent lab results (03/25/18) of CBC w/diff and CMP is as follows: all values are WNL except for RBC at 4.14, HGB at 11.3, HCT at 33.6, Chloride at 94, CO2 at 33, Glucose at 100, BUN at 25, Total Protein at 9.4, Albumin at 3.4, Calcium at 13.8, GFR at 58. 03/25/18 PSA, Total was normal at 1.2  On review of systems, pt reports positional lower back pain, lower right chest wall pain, recently decreased energy levels, relatively weaker appetite, weight loss, right clavicle pain, weakening of the lower extremities, moving his bowels well, and denies changes in urination, abdominal pains, changes in breathing, changes in bowel habits, pain along the spine, leg swelling, and any other symptoms.  INTERVAL HISTORY:   Jonathon [name] returns today for management and evaluation of his Multiple Myeloma. The patient's last visit with [place] was on 09/26/2020. The pt reports that he is doing well overall. We are joined today by his daughter.  The pt reports that he has recently had three teeth removed and they noted that he was not healing as they desired. He was given an antibotic for the tooth next to it that was loose and then inflamed after removal. The last extraction was two weeks ago, where they removed the one single loose tooth next to the three they had removed prior. He finished the antibiotic last week (amoxicillin). He is currently on an oral mouthwash for the next month.  Lab results today 11/14/2020 of CBC w/diff and CMP is as follows: all  values are WNL except for Hgb of 12.2, HCT of 36.9. CMP pending. 11/14/2020 MMP - M spike stable ar 0.3g/dl  On review of systems, pt reports recent tooth extraction, gum inflammation, intermittent lower back pain and denies bleeding issues, fevers, chills, abdominal pain, leg swelling, specific hip pain, and any other symptoms.     MEDICAL HISTORY:  Past Medical History:  Diagnosis Date   Asthma    BENIGN PROSTATIC HYPERTROPHY, HX OF 10/25/2008   Bone metastases ([place]) 04/14/2018   BRADYCARDIA 2005   Chronic diastolic congestive heart failure ([place]) 06/05/2016   CIDP (chronic inflammatory demyelinating polyneuropathy) ([place])    HYPERTENSION 11/21/2006   Iron deficiency anemia, unspecified 04/19/2013   Multiple myeloma ([place]) 04/29/2018   NEPHROLITHIASIS, HX OF 11/21/2006 and 10/15/2008   PACEMAKER, PERMANENT 2005   Gen change 2014 Medtronic Adaptic L dual-chamber pacemaker, serial #[id number]    SVT (supraventricular tachycardia) ([place])    TOBACCO ABUSE 10/25/2008   Quit 2012    SURGICAL HISTORY: Past Surgical History:  Procedure Laterality Date   COLONOSCOPY  2004,2009   PACEMAKER INSERTION  2005   PERMANENT PACEMAKER GENERATOR CHANGE N/A 06/19/2012   Procedure: PERMANENT PACEMAKER GENERATOR CHANGE;  Surgeon: [name], MD; Medtronic Adaptic L dual-chamber pacemaker, serial #[id number]  SKIN GRAFT Right 1962   wrist    SOCIAL HISTORY: Social History   Socioeconomic History   Marital status: Divorced    Spouse name: Not on file   Number of children: 4   Years of education: Not on file   Highest education level: Not on file  Occupational History   Occupation: ENVIROMENTAL SERVICE    Employer: [employer]   Occupation: retired  Tobacco Use   Smoking status: Former    Packs/day: 0.25    Years: 46.00    Pack years: 11.50    Types: Cigarettes    Start date: 03/16/1965    Quit date: 06/15/2010    Years since quitting: 10.4   Smokeless tobacco: Never   Tobacco  comments:    quit 3 years ago  Vaping Use   Vaping Use: Never used  Substance and Sexual Activity   Alcohol use: No    Alcohol/week: 0.0 standard drinks   Drug use: No   Sexual activity: Not on file  Other Topics Concern   Not on file  Social History Narrative   Has relocated from [place] in 2007. 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  Patient in a facility thru [facility]   Social Determinants of Health   Financial Resource Strain: Not on file  Food Insecurity: Not on file  Transportation Needs: Not on file  Physical Activity: Not on file  Stress: Not on file  Social Connections: Not on file  Intimate Partner Violence: Not on file    FAMILY HISTORY: Family History  Problem Relation Age of Onset   Heart attack Father        Died, 21   Kidney disease Father    Parkinson's disease Mother        Died, 49   Breast cancer Sister        Living, 66   Diabetes type II Brother        Living, 70   Breast cancer Sister        Living, 67   Diabetes Daughter        Living, 83   Diabetes Son        Living, 28   Ovarian cancer Daughter    Colon cancer Neg Hx    Rectal cancer Neg Hx    Stomach cancer Neg Hx     ALLERGIES:  is allergic to lexapro [escitalopram oxalate].  MEDICATIONS:  Current Outpatient Medications  Medication Sig Dispense Refill   acyclovir (ZOVIRAX) 400 MG tablet Take 1 tablet (400 mg total) by mouth 2 (two) times daily. 60 tablet 11   aspirin EC 81 MG tablet Take 81 mg by mouth daily.     diltiazem (TIAZAC) 240 MG 24 hr capsule Take 1 capsule by mouth daily.     flecainide (TAMBOCOR) 50 MG tablet Take 75 mg by mouth daily as needed.     furosemide (LASIX) 40 MG tablet Take 40 mg by mouth daily.     irbesartan (AVAPRO) 300 MG tablet Take 300 mg by mouth daily.     memantine (NAMENDA) 5 MG tablet Take 5 mg by mouth 2 (two) times daily.     metoprolol tartrate (LOPRESSOR) 50 MG tablet Take 1 tablet (50 mg total) by mouth 2 (two) times daily. 60 tablet 0   ondansetron (ZOFRAN) 8  MG tablet Take 1 tablet (8 mg total) by mouth 2 (two) times daily as needed (Nausea or vomiting). 30 tablet 1   polyethylene glycol (MIRALAX / GLYCOLAX) packet  Take 17 g by mouth daily. 14 each 0   prochlorperazine (COMPAZINE) 10 MG tablet Take 1 tablet (10 mg total) by mouth every 6 (six) hours as needed (Nausea or vomiting). 30 tablet 1   Saccharomyces boulardii (FLORASTOR PO) Take 250 mg by mouth 2 (two) times daily.     tamsulosin (FLOMAX) 0.4 MG CAPS capsule Take 2 capsules (0.8 mg total) by mouth daily. 90 capsule 0   No current facility-administered medications for this visit.   Facility-Administered Medications Ordered in Other Visits  Medication Dose Route Frequency Provider Last Rate Last Admin   [MAR Hold] sodium chloride flush (NS) 0.9 % injection 10 mL  10 mL Intracatheter PRN Brunetta Genera, MD        REVIEW OF SYSTEMS:   10 Point review of Systems was done is negative except as noted above.  PHYSICAL EXAMINATION: ECOG PERFORMANCE STATUS: 2 - Symptomatic, <50% confined to bed  .BP (!) 157/85 (BP Location: Left Arm, Patient Position: Sitting)   Pulse 78   Temp 98 F (36.7 C) (Oral)   Resp 18   Wt 208 lb 12.8 oz (94.7 kg)   SpO2 100%   BMI 29.12 kg/m   NAD. GENERAL:alert, in no acute distress and comfortable SKIN: no acute rashes, no significant lesions EYES: conjunctiva are pink and non-injected, sclera anicteric OROPHARYNX: MMM, no exudates, no oropharyngeal erythema or ulceration NECK: supple, no JVD LYMPH:  no palpable lymphadenopathy in the cervical, axillary or inguinal regions LUNGS: clear to auscultation b/l with normal respiratory effort HEART: regular rate & rhythm ABDOMEN:  normoactive bowel sounds , non tender, not distended. Extremity: no pedal edema PSYCH: alert & oriented x 3 with fluent speech NEURO: no focal motor/sensory deficits  LABORATORY DATA:  I have reviewed the data as listed  CBC Latest Ref Rng & Units 11/14/2020 10/17/2020  09/19/2020  WBC 4.0 - 10.5 K/uL 6.3 6.5 5.8  Hemoglobin 13.0 - 17.0 g/dL 12.2(L) 12.0(L) 13.0  Hematocrit 39.0 - 52.0 % 36.9(L) 36.0(L) 39.1  Platelets 150 - 400 K/uL 183 194 198    CMP Latest Ref Rng & Units 10/17/2020 09/19/2020 08/22/2020  Glucose 70 - 99 mg/dL 97 106(H) 104(H)  BUN 8 - 23 mg/dL '17 13 18  ' Creatinine 0.61 - 1.24 mg/dL 1.03 1.08 1.16  Sodium 135 - 145 mmol/L 135 137 138  Potassium 3.5 - 5.1 mmol/L 4.4 4.4 4.4  Chloride 98 - 111 mmol/L 100 101 104  CO2 22 - 32 mmol/L '25 25 23  ' Calcium 8.9 - 10.3 mg/dL 9.6 9.1 9.1  Total Protein 6.5 - 8.1 g/dL 7.3 7.2 7.5  Total Bilirubin 0.3 - 1.2 mg/dL 0.7 0.6 0.7  Alkaline Phos 38 - 126 U/L 72 73 72  AST 15 - 41 U/L '15 16 16  ' ALT 0 - 44 U/L '7 6 8   ' 12/14/2018 MMP    12/14/2018 MMP    04/17/18 BM Bx:    RADIOGRAPHIC STUDIES: I have personally reviewed the radiological images as listed and agreed with the findings in the report. No results found.  ASSESSMENT & PLAN:  85 y.o. male with  1. Lytic bone lesions - w/u consistent with newly diagnosed multiple myeloma Labs upon initial presentation from 04/14/18; hypercalcemic with Calcium at 13.8, renal function up form baseline with Creatinine at 1.48, HGB at 11.3. PSA was normal.   03/31/18 CT C/A/P revealed 1. Lytic lesion throughout all visualized vertebra from the lower cervical spine through the sacrum. Epidural extension of tumor  T2, T3 and L2 level. Right L3-4 foraminal extension tumor. 2. Lytic lesions involving majority of ribs. Multiple pathologic rib fractures with bony expansion/sub pleural extension of tumor at several levels. 3. Lytic lesions of the scapula, hips bilaterally and pelvis bilaterally. With further progression of tumor, patient may be at risk for pathologic fractures. Pathologic fracture right clavicle incompletely assessed. 4. Circumferential narrowing sigmoid colon. Question possibility of underlying mass (series 2, image 97). Alternatively this could  represent muscular hypertrophy/peristalsis. 5. Gastric antrum circumferential narrowing. Cannot exclude mass although this may be related to peristalsis. 6. No primary lung malignancy noted. 7. Matted aortic pulmonary window lymph nodes. 8. Circumferential urinary bladder wall thickening greater anterior dome region. Right lateral bladder diverticulum. 9. 5 mm low-density lesion pancreatic body (series 5, image 10) unchanged. This is felt unlikely to be a primary malignancy. 10. Right parapelvic 2.2 cm cyst without significant change. Scattered low-density renal lesions bilaterally too small to adequately characterize. Some were present previously. Statistically these are likely cysts. 11. Stable adrenal gland hyperplasia. 12. Gynecomastia. 13. Prostate gland causes slight impression upon the bladder base. 14.  Aortic Atherosclerosis. 15. Sequential pacemaker in place. 16. Gallstones.   04/23/18 PET/CT revealed Innumerable hypermetabolic lytic lesions throughout the skeleton especially involving the spine, ribs, bony pelvis along with some involvement of both proximal femurs. The appearance is compatible with pathologic diagnosis of plasma cell neoplasm could well reflect multiple myeloma. Resulting pathologic fractures of the right lateral clavicle and of multiple bilateral ribs. Lesions are present along the cortical margins of the spinal canal. 2. Both the area and the stomach in the area in the sigmoid colon drawn attention to on prior CT scan appear benign normal today. 3. The confluence of AP window lymph nodes measures about 1.2 cm in short axis with maximum SUV 3.1, which is mildly above the blood pool merit surveillance. 4. Other imaging findings of potential clinical significance: Aortic Atherosclerosis. Coronary atherosclerosis. Pacemaker noted. Borderline prostatomegaly. Cholelithiasis.  04/21/18 BM Bx revealed Normocellular bone marrow with Plasma Cell neoplasm. Scattered medium sized clusters of  kappa-restricted plasma cells (14% aspirate, 10% CD138 immunohistochemistry.   I do suspect that the burden of disease is underestimated in the bone marrow sample given PET/CT findings of innumerable lytic lesions and an M spike of 3.4g   06/08/18 DG Hips bilat which revealed Innumerable lucencies are noted throughout the pelvis and proximal femurs, similar findings noted on prior PET-CT of 04/23/2018. Findings are consistent patient's known multiple myeloma. Femoral necks are intact. 2.  Aortoiliac atherosclerotic vascular disease.  2. Pathologic right clavicle fracture due to myeloma- resolved 3. Hypercalcemia- resolved.   PLAN:    -Discussed pt labwork today, 11/14/2020; blood counts stable, other labs pending. -Discussed continuing treatment while pt is healing. The pt notes he wishes to continue with treatment and not hold it. He is slowly improving. -Advised pt of increased risk of infection while on treatment.  -Advised pt to remind them today to get the Evusheld. -Will continue with Daratumumab as per current plan unless signs of overt progression. -The pt has no prohibitive toxicities from continuing q4weeks Daratumumab at this time. -Continue OTC Vitamin D. -Hold Xgeva at this time until dental issues are resolved. -Continue stationary bike and physical activity daily.  -Will see back in 2 months with labs.   FOLLOW UP: Plz schedule next 4 doses of monthly Daratumumab with labs MD visit in 2 months Evusheld today Continuing to [employer]    The total time spent  in the appointment was 30 minutes and more than 50% was on counseling and direct patient cares, ordering and mx of immunotherapy.   All of the patient's questions were answered with apparent satisfaction. The patient knows to call the clinic with any problems, questions or concerns.    Sullivan Lone MD [place] AAHIVMS [facility] Hematology/Oncology Physician [facility]  (Office):       [phone] (Work  cell):  [phone] (Fax):           [phone]  11/14/2020 10:27 AM  I, Reinaldo Raddle, am acting as scribe for Dr. Sullivan Lone, MD.   .I have reviewed the above documentation for accuracy and completeness, and I agree with the above. Brunetta Genera MD

## 2020-11-14 ENCOUNTER — Inpatient Hospital Stay (HOSPITAL_BASED_OUTPATIENT_CLINIC_OR_DEPARTMENT_OTHER): Payer: Medicare Other | Admitting: Hematology

## 2020-11-14 ENCOUNTER — Other Ambulatory Visit: Payer: Self-pay

## 2020-11-14 ENCOUNTER — Inpatient Hospital Stay: Payer: Medicare Other | Attending: Hematology

## 2020-11-14 ENCOUNTER — Inpatient Hospital Stay: Payer: Medicare Other

## 2020-11-14 VITALS — BP 157/85 | HR 78 | Temp 98.0°F | Resp 18 | Wt 208.8 lb

## 2020-11-14 VITALS — BP 168/83 | HR 61 | Temp 97.4°F | Resp 17

## 2020-11-14 DIAGNOSIS — C9000 Multiple myeloma not having achieved remission: Secondary | ICD-10-CM | POA: Insufficient documentation

## 2020-11-14 DIAGNOSIS — Z5112 Encounter for antineoplastic immunotherapy: Secondary | ICD-10-CM | POA: Diagnosis not present

## 2020-11-14 DIAGNOSIS — C9001 Multiple myeloma in remission: Secondary | ICD-10-CM

## 2020-11-14 DIAGNOSIS — C9002 Multiple myeloma in relapse: Secondary | ICD-10-CM

## 2020-11-14 DIAGNOSIS — Z79899 Other long term (current) drug therapy: Secondary | ICD-10-CM | POA: Diagnosis not present

## 2020-11-14 DIAGNOSIS — Z7189 Other specified counseling: Secondary | ICD-10-CM

## 2020-11-14 LAB — CMP (CANCER CENTER ONLY)
ALT: 8 U/L (ref 0–44)
AST: 17 U/L (ref 15–41)
Albumin: 3.4 g/dL — ABNORMAL LOW (ref 3.5–5.0)
Alkaline Phosphatase: 75 U/L (ref 38–126)
Anion gap: 10 (ref 5–15)
BUN: 14 mg/dL (ref 8–23)
CO2: 24 mmol/L (ref 22–32)
Calcium: 9 mg/dL (ref 8.9–10.3)
Chloride: 102 mmol/L (ref 98–111)
Creatinine: 1.09 mg/dL (ref 0.61–1.24)
GFR, Estimated: >60 mL/min (ref >60)
Glucose, Bld: 115 mg/dL — ABNORMAL HIGH (ref 70–99)
Potassium: 4.2 mmol/L (ref 3.5–5.1)
Sodium: 136 mmol/L (ref 135–145)
Total Bilirubin: 0.7 mg/dL (ref 0.3–1.2)
Total Protein: 7.3 g/dL (ref 6.5–8.1)

## 2020-11-14 LAB — CBC WITH DIFFERENTIAL/PLATELET
Abs Immature Granulocytes: 0.01 10*3/uL (ref 0.00–0.07)
Basophils Absolute: 0.1 10*3/uL (ref 0.0–0.1)
Basophils Relative: 1 %
Eosinophils Absolute: 0.5 10*3/uL (ref 0.0–0.5)
Eosinophils Relative: 8 %
HCT: 36.9 % — ABNORMAL LOW (ref 39.0–52.0)
Hemoglobin: 12.2 g/dL — ABNORMAL LOW (ref 13.0–17.0)
Immature Granulocytes: 0 %
Lymphocytes Relative: 40 %
Lymphs Abs: 2.5 10*3/uL (ref 0.7–4.0)
MCH: 28 pg (ref 26.0–34.0)
MCHC: 33.1 g/dL (ref 30.0–36.0)
MCV: 84.6 fL (ref 80.0–100.0)
Monocytes Absolute: 0.6 10*3/uL (ref 0.1–1.0)
Monocytes Relative: 9 %
Neutro Abs: 2.7 10*3/uL (ref 1.7–7.7)
Neutrophils Relative %: 42 %
Platelets: 183 10*3/uL (ref 150–400)
RBC: 4.36 MIL/uL (ref 4.22–5.81)
RDW: 14 % (ref 11.5–15.5)
WBC: 6.3 10*3/uL (ref 4.0–10.5)
nRBC: 0 % (ref 0.0–0.2)

## 2020-11-14 MED ORDER — SODIUM CHLORIDE 0.9 % IV SOLN
Freq: Once | INTRAVENOUS | Status: AC
Start: 2020-11-14 — End: 2020-11-14
  Filled 2020-11-14: qty 250

## 2020-11-14 MED ORDER — DIPHENHYDRAMINE HCL 25 MG PO CAPS
ORAL_CAPSULE | ORAL | Status: AC
  Filled 2020-11-14: qty 2

## 2020-11-14 MED ORDER — ACETAMINOPHEN 325 MG PO TABS
ORAL_TABLET | ORAL | Status: AC
  Filled 2020-11-14: qty 2

## 2020-11-14 MED ORDER — MONTELUKAST SODIUM 10 MG PO TABS
10.0000 mg | ORAL_TABLET | Freq: Once | ORAL | Status: AC
  Administered 2020-11-14: 10 mg via ORAL

## 2020-11-14 MED ORDER — SODIUM CHLORIDE 0.9 % IV SOLN
20.0000 mg | Freq: Once | INTRAVENOUS | Status: AC
  Administered 2020-11-14: 20 mg via INTRAVENOUS
  Filled 2020-11-14: qty 20

## 2020-11-14 MED ORDER — DIPHENHYDRAMINE HCL 25 MG PO CAPS
50.0000 mg | ORAL_CAPSULE | Freq: Once | ORAL | Status: AC
  Administered 2020-11-14: 50 mg via ORAL

## 2020-11-14 MED ORDER — FAMOTIDINE 20 MG IN NS 100 ML IVPB
20.0000 mg | Freq: Once | INTRAVENOUS | Status: AC
Start: 2020-11-14 — End: 2020-11-14
  Administered 2020-11-14: 20 mg via INTRAVENOUS

## 2020-11-14 MED ORDER — MONTELUKAST SODIUM 10 MG PO TABS
ORAL_TABLET | ORAL | Status: AC
  Filled 2020-11-14: qty 1

## 2020-11-14 MED ORDER — FAMOTIDINE 20 MG IN NS 100 ML IVPB
INTRAVENOUS | Status: AC
  Filled 2020-11-14: qty 100

## 2020-11-14 MED ORDER — SODIUM CHLORIDE 0.9 % IV SOLN
16.0000 mg/kg | Freq: Once | INTRAVENOUS | Status: AC
  Administered 2020-11-14: 1600 mg via INTRAVENOUS
  Filled 2020-11-14: qty 80

## 2020-11-14 MED ORDER — ACETAMINOPHEN 325 MG PO TABS
650.0000 mg | ORAL_TABLET | Freq: Once | ORAL | Status: AC
Start: 2020-11-14 — End: 2020-11-14
  Administered 2020-11-14: 650 mg via ORAL

## 2020-11-14 NOTE — Progress Notes (Signed)
Patient is to receive Evusheld at next infusion appt.

## 2020-11-14 NOTE — Patient Instructions (Signed)
[facility]   Discharge Instructions: Thank you for choosing [facility] to provide your oncology and hematology care.   If you have a lab appointment with the Cancer Center, please go directly to the Cancer Center and check in at the registration area.   Wear comfortable clothing and clothing appropriate for easy access to any Portacath or PICC line.   We strive to give you quality time with your provider. You may need to reschedule your appointment if you arrive late (15 or more minutes).  Arriving late affects you and other patients whose appointments are after yours.  Also, if you miss three or more appointments without notifying the office, you may be dismissed from the clinic at the provider's discretion.      For prescription refill requests, have your pharmacy contact our office and allow 72 hours for refills to be completed.    Today you received the following chemotherapy and/or immunotherapy agents: daratumumab      To help prevent nausea and vomiting after your treatment, we encourage you to take your nausea medication as directed.  BELOW ARE SYMPTOMS THAT SHOULD BE REPORTED IMMEDIATELY: *FEVER GREATER THAN 100.4 F (38 C) OR HIGHER *CHILLS OR SWEATING *NAUSEA AND VOMITING THAT IS NOT CONTROLLED WITH YOUR NAUSEA MEDICATION *UNUSUAL SHORTNESS OF BREATH *UNUSUAL BRUISING OR BLEEDING *URINARY PROBLEMS (pain or burning when urinating, or frequent urination) *BOWEL PROBLEMS (unusual diarrhea, constipation, pain near the anus) TENDERNESS IN MOUTH AND THROAT WITH OR WITHOUT PRESENCE OF ULCERS (sore throat, sores in mouth, or a toothache) UNUSUAL RASH, SWELLING OR PAIN  UNUSUAL VAGINAL DISCHARGE OR ITCHING   Items with * indicate a potential emergency and should be followed up as soon as possible or go to the Emergency Department if any problems should occur.  Please show the CHEMOTHERAPY ALERT CARD or IMMUNOTHERAPY ALERT CARD at check-in  to the Emergency Department and triage nurse.  Should you have questions after your visit or need to cancel or reschedule your appointment, please contact [facility]  Dept: [phone]  and follow the prompts.  Office hours are 8:00 a.m. to 4:30 p.m. Monday - Friday. Please note that voicemails left after 4:00 p.m. may not be returned until the following business day.  We are closed weekends and major holidays. You have access to a nurse at all times for urgent questions. Please call the main number to the clinic Dept: [phone] and follow the prompts.   For any non-urgent questions, you may also contact your provider using MyChart. We now offer e-Visits for anyone 18 and older to request care online for non-urgent symptoms. For details visit [email or web address].   Also download the MyChart app! Go to the app store, search "MyChart", open the app, select [facility], and log in with your MyChart username and password.  Due to Covid, a mask is required upon entering the hospital/clinic. If you do not have a mask, one will be given to you upon arrival. For doctor visits, patients may have 1 support person aged 18 or older with them. For treatment visits, patients cannot have anyone with them due to current Covid guidelines and our immunocompromised population.   

## 2020-11-15 ENCOUNTER — Telehealth: Payer: Self-pay | Admitting: Hematology

## 2020-11-15 NOTE — Telephone Encounter (Signed)
Scheduled follow-up appointments per 7/5 los. Patient's daughter is aware.

## 2020-11-16 DIAGNOSIS — Z20828 Contact with and (suspected) exposure to other viral communicable diseases: Secondary | ICD-10-CM | POA: Diagnosis not present

## 2020-11-17 ENCOUNTER — Ambulatory Visit: Payer: Medicare Other

## 2020-11-17 ENCOUNTER — Other Ambulatory Visit: Payer: Medicare Other

## 2020-11-20 ENCOUNTER — Encounter: Payer: Self-pay | Admitting: Hematology

## 2020-11-20 LAB — MULTIPLE MYELOMA PANEL, SERUM
Albumin SerPl Elph-Mcnc: 3.4 g/dL (ref 2.9–4.4)
Albumin/Glob SerPl: 1.1 (ref 0.7–1.7)
Alpha 1: 0.2 g/dL (ref 0.0–0.4)
Alpha2 Glob SerPl Elph-Mcnc: 0.9 g/dL (ref 0.4–1.0)
B-Globulin SerPl Elph-Mcnc: 1 g/dL (ref 0.7–1.3)
Gamma Glob SerPl Elph-Mcnc: 1.1 g/dL (ref 0.4–1.8)
Globulin, Total: 3.2 g/dL (ref 2.2–3.9)
IgA: 884 mg/dL — ABNORMAL HIGH (ref 61–437)
IgG (Immunoglobin G), Serum: 598 mg/dL — ABNORMAL LOW (ref 603–1613)
IgM (Immunoglobulin M), Srm: 32 mg/dL (ref 15–143)
M Protein SerPl Elph-Mcnc: 0.3 g/dL — ABNORMAL HIGH
Total Protein ELP: 6.6 g/dL (ref 6.0–8.5)

## 2020-11-21 DIAGNOSIS — M79675 Pain in left toe(s): Secondary | ICD-10-CM | POA: Diagnosis not present

## 2020-11-21 DIAGNOSIS — B351 Tinea unguium: Secondary | ICD-10-CM | POA: Diagnosis not present

## 2020-12-12 ENCOUNTER — Other Ambulatory Visit: Payer: Self-pay

## 2020-12-12 ENCOUNTER — Inpatient Hospital Stay: Payer: Medicare Other | Attending: Hematology

## 2020-12-12 ENCOUNTER — Inpatient Hospital Stay: Payer: Medicare Other

## 2020-12-12 VITALS — BP 153/83 | HR 65 | Temp 97.9°F | Resp 19 | Wt 206.8 lb

## 2020-12-12 DIAGNOSIS — Z298 Encounter for other specified prophylactic measures: Secondary | ICD-10-CM | POA: Insufficient documentation

## 2020-12-12 DIAGNOSIS — Z7189 Other specified counseling: Secondary | ICD-10-CM

## 2020-12-12 DIAGNOSIS — Z5112 Encounter for antineoplastic immunotherapy: Secondary | ICD-10-CM | POA: Diagnosis not present

## 2020-12-12 DIAGNOSIS — C9001 Multiple myeloma in remission: Secondary | ICD-10-CM

## 2020-12-12 DIAGNOSIS — Z7982 Long term (current) use of aspirin: Secondary | ICD-10-CM | POA: Diagnosis not present

## 2020-12-12 DIAGNOSIS — C9000 Multiple myeloma not having achieved remission: Secondary | ICD-10-CM | POA: Diagnosis not present

## 2020-12-12 DIAGNOSIS — Z9221 Personal history of antineoplastic chemotherapy: Secondary | ICD-10-CM | POA: Diagnosis not present

## 2020-12-12 DIAGNOSIS — Z87891 Personal history of nicotine dependence: Secondary | ICD-10-CM | POA: Diagnosis not present

## 2020-12-12 DIAGNOSIS — Z79899 Other long term (current) drug therapy: Secondary | ICD-10-CM | POA: Insufficient documentation

## 2020-12-12 LAB — CMP (CANCER CENTER ONLY)
ALT: 11 U/L (ref 0–44)
AST: 21 U/L (ref 15–41)
Albumin: 3.9 g/dL (ref 3.5–5.0)
Alkaline Phosphatase: 63 U/L (ref 38–126)
Anion gap: 7 (ref 5–15)
BUN: 12 mg/dL (ref 8–23)
CO2: 29 mmol/L (ref 22–32)
Calcium: 9.3 mg/dL (ref 8.9–10.3)
Chloride: 100 mmol/L (ref 98–111)
Creatinine: 1.05 mg/dL (ref 0.61–1.24)
GFR, Estimated: >60 mL/min (ref >60)
Glucose, Bld: 109 mg/dL — ABNORMAL HIGH (ref 70–99)
Potassium: 4.1 mmol/L (ref 3.5–5.1)
Sodium: 136 mmol/L (ref 135–145)
Total Bilirubin: 0.9 mg/dL (ref 0.3–1.2)
Total Protein: 7.7 g/dL (ref 6.5–8.1)

## 2020-12-12 LAB — CBC WITH DIFFERENTIAL/PLATELET
Abs Immature Granulocytes: 0.01 10*3/uL (ref 0.00–0.07)
Basophils Absolute: 0.1 10*3/uL (ref 0.0–0.1)
Basophils Relative: 1 %
Eosinophils Absolute: 0.5 10*3/uL (ref 0.0–0.5)
Eosinophils Relative: 9 %
HCT: 36.9 % — ABNORMAL LOW (ref 39.0–52.0)
Hemoglobin: 12.4 g/dL — ABNORMAL LOW (ref 13.0–17.0)
Immature Granulocytes: 0 %
Lymphocytes Relative: 39 %
Lymphs Abs: 2.2 10*3/uL (ref 0.7–4.0)
MCH: 28.1 pg (ref 26.0–34.0)
MCHC: 33.6 g/dL (ref 30.0–36.0)
MCV: 83.5 fL (ref 80.0–100.0)
Monocytes Absolute: 0.5 10*3/uL (ref 0.1–1.0)
Monocytes Relative: 10 %
Neutro Abs: 2.3 10*3/uL (ref 1.7–7.7)
Neutrophils Relative %: 41 %
Platelets: 184 10*3/uL (ref 150–400)
RBC: 4.42 MIL/uL (ref 4.22–5.81)
RDW: 13.6 % (ref 11.5–15.5)
WBC: 5.5 10*3/uL (ref 4.0–10.5)
nRBC: 0 % (ref 0.0–0.2)

## 2020-12-12 MED ORDER — DIPHENHYDRAMINE HCL 25 MG PO CAPS
50.0000 mg | ORAL_CAPSULE | Freq: Once | ORAL | Status: AC
  Administered 2020-12-12: 50 mg via ORAL

## 2020-12-12 MED ORDER — DIPHENHYDRAMINE HCL 25 MG PO CAPS
ORAL_CAPSULE | ORAL | Status: AC
  Filled 2020-12-12: qty 2

## 2020-12-12 MED ORDER — ACETAMINOPHEN 325 MG PO TABS
650.0000 mg | ORAL_TABLET | Freq: Once | ORAL | Status: AC
  Administered 2020-12-12: 650 mg via ORAL

## 2020-12-12 MED ORDER — CILGAVIMAB (PART OF EVUSHELD) INJECTION
300.0000 mg | Freq: Once | INTRAMUSCULAR | Status: AC
  Administered 2020-12-12: 300 mg via INTRAMUSCULAR
  Filled 2020-12-12: qty 3

## 2020-12-12 MED ORDER — SODIUM CHLORIDE 0.9 % IV SOLN
Freq: Once | INTRAVENOUS | Status: AC
  Filled 2020-12-12: qty 250

## 2020-12-12 MED ORDER — MONTELUKAST SODIUM 10 MG PO TABS
ORAL_TABLET | ORAL | Status: AC
  Filled 2020-12-12: qty 1

## 2020-12-12 MED ORDER — SODIUM CHLORIDE 0.9 % IV SOLN
16.0000 mg/kg | Freq: Once | INTRAVENOUS | Status: AC
  Administered 2020-12-12: 1600 mg via INTRAVENOUS
  Filled 2020-12-12: qty 80

## 2020-12-12 MED ORDER — SODIUM CHLORIDE 0.9 % IV SOLN
20.0000 mg | Freq: Once | INTRAVENOUS | Status: AC
  Administered 2020-12-12: 20 mg via INTRAVENOUS
  Filled 2020-12-12: qty 20

## 2020-12-12 MED ORDER — TIXAGEVIMAB (PART OF EVUSHELD) INJECTION
300.0000 mg | Freq: Once | INTRAMUSCULAR | Status: AC
  Administered 2020-12-12: 300 mg via INTRAMUSCULAR
  Filled 2020-12-12: qty 3

## 2020-12-12 MED ORDER — MONTELUKAST SODIUM 10 MG PO TABS
10.0000 mg | ORAL_TABLET | Freq: Once | ORAL | Status: AC
  Administered 2020-12-12: 10 mg via ORAL

## 2020-12-12 MED ORDER — FAMOTIDINE 20 MG IN NS 100 ML IVPB
INTRAVENOUS | Status: AC
  Filled 2020-12-12: qty 100

## 2020-12-12 MED ORDER — FAMOTIDINE 20 MG IN NS 100 ML IVPB
20.0000 mg | Freq: Once | INTRAVENOUS | Status: AC
  Administered 2020-12-12: 20 mg via INTRAVENOUS

## 2020-12-12 MED ORDER — ACETAMINOPHEN 325 MG PO TABS
ORAL_TABLET | ORAL | Status: AC
  Filled 2020-12-12: qty 2

## 2020-12-12 NOTE — Patient Instructions (Signed)
[facility]  Discharge Instructions: Thank you for choosing [facility] to provide your oncology and hematology care.   If you have a lab appointment with the [facility], please go directly to the [facility] and check in at the registration area.   Wear comfortable clothing and clothing appropriate for easy access to any Portacath or PICC line.   We strive to give you quality time with your provider. You may need to reschedule your appointment if you arrive late (15 or more minutes).  Arriving late affects you and other patients whose appointments are after yours.  Also, if you miss three or more appointments without notifying the office, you may be dismissed from the clinic at the provider's discretion.      For prescription refill requests, have your pharmacy contact our office and allow 72 hours for refills to be completed.    Today you received the following chemotherapy and/or immunotherapy agent: Daratumumab (Darzalex).   To help prevent nausea and vomiting after your treatment, we encourage you to take your nausea medication as directed.  BELOW ARE SYMPTOMS THAT SHOULD BE REPORTED IMMEDIATELY: *FEVER GREATER THAN 100.4 F (38 C) OR HIGHER *CHILLS OR SWEATING *NAUSEA AND VOMITING THAT IS NOT CONTROLLED WITH YOUR NAUSEA MEDICATION *UNUSUAL SHORTNESS OF BREATH *UNUSUAL BRUISING OR BLEEDING *URINARY PROBLEMS (pain or burning when urinating, or frequent urination) *BOWEL PROBLEMS (unusual diarrhea, constipation, pain near the anus) TENDERNESS IN MOUTH AND THROAT WITH OR WITHOUT PRESENCE OF ULCERS (sore throat, sores in mouth, or a toothache) UNUSUAL RASH, SWELLING OR PAIN  UNUSUAL VAGINAL DISCHARGE OR ITCHING   Items with * indicate a potential emergency and should be followed up as soon as possible or go to the Emergency Department if any problems should occur.  Please show the CHEMOTHERAPY ALERT CARD or IMMUNOTHERAPY ALERT CARD at  check-in to the Emergency Department and triage nurse.  Should you have questions after your visit or need to cancel or reschedule your appointment, please contact [facility]  Dept: [phone]  and follow the prompts.  Office hours are 8:00 a.m. to 4:30 p.m. Monday - Friday. Please note that voicemails left after 4:00 p.m. may not be returned until the following business day.  We are closed weekends and major holidays. You have access to a nurse at all times for urgent questions. Please call the main number to the clinic Dept: [phone] and follow the prompts.   For any non-urgent questions, you may also contact your provider using MyChart. We now offer e-Visits for anyone 68 and older to request care online for non-urgent symptoms. For details visit [email or web address].   Also download the MyChart app! Go to the app store, search "MyChart", open the app, select [facility], and log in with your MyChart username and password.  Due to Covid, a mask is required upon entering the hospital/clinic. If you do not have a mask, one will be given to you upon arrival. For doctor visits, patients may have 1 support person aged 16 or older with them. For treatment visits, patients cannot have anyone with them due to current Covid guidelines and our immunocompromised population.

## 2020-12-13 LAB — MULTIPLE MYELOMA PANEL, SERUM
Albumin SerPl Elph-Mcnc: 3.3 g/dL (ref 2.9–4.4)
Albumin/Glob SerPl: 1 (ref 0.7–1.7)
Alpha 1: 0.2 g/dL (ref 0.0–0.4)
Alpha2 Glob SerPl Elph-Mcnc: 0.9 g/dL (ref 0.4–1.0)
B-Globulin SerPl Elph-Mcnc: 1.1 g/dL (ref 0.7–1.3)
Gamma Glob SerPl Elph-Mcnc: 1 g/dL (ref 0.4–1.8)
Globulin, Total: 3.4 g/dL (ref 2.2–3.9)
IgA: 932 mg/dL — ABNORMAL HIGH (ref 61–437)
IgG (Immunoglobin G), Serum: 575 mg/dL — ABNORMAL LOW (ref 603–1613)
IgM (Immunoglobulin M), Srm: 32 mg/dL (ref 15–143)
M Protein SerPl Elph-Mcnc: 0.3 g/dL — ABNORMAL HIGH
Total Protein ELP: 6.7 g/dL (ref 6.0–8.5)

## 2021-01-03 ENCOUNTER — Ambulatory Visit (INDEPENDENT_AMBULATORY_CARE_PROVIDER_SITE_OTHER): Payer: Medicare Other

## 2021-01-03 DIAGNOSIS — I5032 Chronic diastolic (congestive) heart failure: Secondary | ICD-10-CM

## 2021-01-03 LAB — CUP PACEART REMOTE DEVICE CHECK
Battery Impedance: 1248 Ohm
Battery Remaining Longevity: 54 mo
Battery Voltage: 2.75 V
Brady Statistic AP VP Percent: 22 %
Brady Statistic AP VS Percent: 0 %
Brady Statistic AS VP Percent: 66 %
Brady Statistic AS VS Percent: 11 %
Date Time Interrogation Session: 20220824092356
Implantable Lead Implant Date: 20050121
Implantable Lead Implant Date: 20050121
Implantable Lead Location: 753859
Implantable Lead Location: 753860
Implantable Lead Model: 5076
Implantable Lead Model: 5076
Implantable Pulse Generator Implant Date: 20140207
Lead Channel Impedance Value: 552 Ohm
Lead Channel Impedance Value: 635 Ohm
Lead Channel Pacing Threshold Amplitude: 0.5 V
Lead Channel Pacing Threshold Amplitude: 1.125 V
Lead Channel Pacing Threshold Pulse Width: 0.4 ms
Lead Channel Pacing Threshold Pulse Width: 0.4 ms
Lead Channel Setting Pacing Amplitude: 2.25 V
Lead Channel Setting Pacing Amplitude: 2.5 V
Lead Channel Setting Pacing Pulse Width: 0.4 ms
Lead Channel Setting Sensing Sensitivity: 2 mV

## 2021-01-09 ENCOUNTER — Inpatient Hospital Stay (HOSPITAL_BASED_OUTPATIENT_CLINIC_OR_DEPARTMENT_OTHER): Payer: Medicare Other | Admitting: Hematology

## 2021-01-09 ENCOUNTER — Inpatient Hospital Stay: Payer: Medicare Other

## 2021-01-09 ENCOUNTER — Other Ambulatory Visit: Payer: Self-pay

## 2021-01-09 VITALS — BP 128/71 | HR 61 | Temp 97.8°F | Resp 18

## 2021-01-09 VITALS — BP 139/82 | HR 73 | Temp 98.2°F | Resp 17 | Wt 207.7 lb

## 2021-01-09 DIAGNOSIS — C9002 Multiple myeloma in relapse: Secondary | ICD-10-CM

## 2021-01-09 DIAGNOSIS — Z7982 Long term (current) use of aspirin: Secondary | ICD-10-CM | POA: Diagnosis not present

## 2021-01-09 DIAGNOSIS — C9000 Multiple myeloma not having achieved remission: Secondary | ICD-10-CM | POA: Diagnosis not present

## 2021-01-09 DIAGNOSIS — C9001 Multiple myeloma in remission: Secondary | ICD-10-CM | POA: Diagnosis not present

## 2021-01-09 DIAGNOSIS — Z87891 Personal history of nicotine dependence: Secondary | ICD-10-CM | POA: Diagnosis not present

## 2021-01-09 DIAGNOSIS — Z7189 Other specified counseling: Secondary | ICD-10-CM

## 2021-01-09 DIAGNOSIS — Z298 Encounter for other specified prophylactic measures: Secondary | ICD-10-CM | POA: Diagnosis not present

## 2021-01-09 DIAGNOSIS — Z5112 Encounter for antineoplastic immunotherapy: Secondary | ICD-10-CM | POA: Diagnosis not present

## 2021-01-09 DIAGNOSIS — Z9221 Personal history of antineoplastic chemotherapy: Secondary | ICD-10-CM | POA: Diagnosis not present

## 2021-01-09 LAB — CMP (CANCER CENTER ONLY)
ALT: 10 U/L (ref 0–44)
AST: 18 U/L (ref 15–41)
Albumin: 3.5 g/dL (ref 3.5–5.0)
Alkaline Phosphatase: 77 U/L (ref 38–126)
Anion gap: 9 (ref 5–15)
BUN: 11 mg/dL (ref 8–23)
CO2: 24 mmol/L (ref 22–32)
Calcium: 9.2 mg/dL (ref 8.9–10.3)
Chloride: 101 mmol/L (ref 98–111)
Creatinine: 1.03 mg/dL (ref 0.61–1.24)
GFR, Estimated: >60 mL/min (ref >60)
Glucose, Bld: 101 mg/dL — ABNORMAL HIGH (ref 70–99)
Potassium: 4.3 mmol/L (ref 3.5–5.1)
Sodium: 134 mmol/L — ABNORMAL LOW (ref 135–145)
Total Bilirubin: 0.7 mg/dL (ref 0.3–1.2)
Total Protein: 7.5 g/dL (ref 6.5–8.1)

## 2021-01-09 LAB — CBC WITH DIFFERENTIAL/PLATELET
Abs Immature Granulocytes: 0.01 10*3/uL (ref 0.00–0.07)
Basophils Absolute: 0.1 10*3/uL (ref 0.0–0.1)
Basophils Relative: 1 %
Eosinophils Absolute: 0.4 10*3/uL (ref 0.0–0.5)
Eosinophils Relative: 7 %
HCT: 36.2 % — ABNORMAL LOW (ref 39.0–52.0)
Hemoglobin: 12.3 g/dL — ABNORMAL LOW (ref 13.0–17.0)
Immature Granulocytes: 0 %
Lymphocytes Relative: 45 %
Lymphs Abs: 2.5 10*3/uL (ref 0.7–4.0)
MCH: 28.1 pg (ref 26.0–34.0)
MCHC: 34 g/dL (ref 30.0–36.0)
MCV: 82.8 fL (ref 80.0–100.0)
Monocytes Absolute: 0.5 10*3/uL (ref 0.1–1.0)
Monocytes Relative: 9 %
Neutro Abs: 2.2 10*3/uL (ref 1.7–7.7)
Neutrophils Relative %: 38 %
Platelets: 183 10*3/uL (ref 150–400)
RBC: 4.37 MIL/uL (ref 4.22–5.81)
RDW: 13.9 % (ref 11.5–15.5)
WBC: 5.7 10*3/uL (ref 4.0–10.5)
nRBC: 0 % (ref 0.0–0.2)

## 2021-01-09 MED ORDER — DIPHENHYDRAMINE HCL 25 MG PO CAPS
50.0000 mg | ORAL_CAPSULE | Freq: Once | ORAL | Status: AC
  Administered 2021-01-09: 50 mg via ORAL
  Filled 2021-01-09: qty 2

## 2021-01-09 MED ORDER — SODIUM CHLORIDE 0.9 % IV SOLN
20.0000 mg | Freq: Once | INTRAVENOUS | Status: AC
  Administered 2021-01-09: 20 mg via INTRAVENOUS
  Filled 2021-01-09: qty 20

## 2021-01-09 MED ORDER — MONTELUKAST SODIUM 10 MG PO TABS
10.0000 mg | ORAL_TABLET | Freq: Once | ORAL | Status: AC
  Administered 2021-01-09: 10 mg via ORAL
  Filled 2021-01-09: qty 1

## 2021-01-09 MED ORDER — ACETAMINOPHEN 325 MG PO TABS
650.0000 mg | ORAL_TABLET | Freq: Once | ORAL | Status: AC
  Administered 2021-01-09: 650 mg via ORAL
  Filled 2021-01-09: qty 2

## 2021-01-09 MED ORDER — SODIUM CHLORIDE 0.9 % IV SOLN: Freq: Once | INTRAVENOUS | Status: AC

## 2021-01-09 MED ORDER — FAMOTIDINE 20 MG IN NS 100 ML IVPB
20.0000 mg | Freq: Once | INTRAVENOUS | Status: AC
  Administered 2021-01-09: 20 mg via INTRAVENOUS
  Filled 2021-01-09: qty 100

## 2021-01-09 MED ORDER — SODIUM CHLORIDE 0.9 % IV SOLN
16.0000 mg/kg | Freq: Once | INTRAVENOUS | Status: AC
  Administered 2021-01-09: 1600 mg via INTRAVENOUS
  Filled 2021-01-09: qty 80

## 2021-01-09 NOTE — Patient Instructions (Signed)
[facility]  Discharge Instructions: Thank you for choosing [facility] to provide your oncology and hematology care.   If you have a lab appointment with the Cancer Center, please go directly to the Cancer Center and check in at the registration area.   Wear comfortable clothing and clothing appropriate for easy access to any Portacath or PICC line.   We strive to give you quality time with your provider. You may need to reschedule your appointment if you arrive late (15 or more minutes).  Arriving late affects you and other patients whose appointments are after yours.  Also, if you miss three or more appointments without notifying the office, you may be dismissed from the clinic at the provider's discretion.      For prescription refill requests, have your pharmacy contact our office and allow 72 hours for refills to be completed.    Today you received the following chemotherapy and/or immunotherapy agents darzalex      To help prevent nausea and vomiting after your treatment, we encourage you to take your nausea medication as directed.  BELOW ARE SYMPTOMS THAT SHOULD BE REPORTED IMMEDIATELY: *FEVER GREATER THAN 100.4 F (38 C) OR HIGHER *CHILLS OR SWEATING *NAUSEA AND VOMITING THAT IS NOT CONTROLLED WITH YOUR NAUSEA MEDICATION *UNUSUAL SHORTNESS OF BREATH *UNUSUAL BRUISING OR BLEEDING *URINARY PROBLEMS (pain or burning when urinating, or frequent urination) *BOWEL PROBLEMS (unusual diarrhea, constipation, pain near the anus) TENDERNESS IN MOUTH AND THROAT WITH OR WITHOUT PRESENCE OF ULCERS (sore throat, sores in mouth, or a toothache) UNUSUAL RASH, SWELLING OR PAIN  UNUSUAL VAGINAL DISCHARGE OR ITCHING   Items with * indicate a potential emergency and should be followed up as soon as possible or go to the Emergency Department if any problems should occur.  Please show the CHEMOTHERAPY ALERT CARD or IMMUNOTHERAPY ALERT CARD at check-in to  the Emergency Department and triage nurse.  Should you have questions after your visit or need to cancel or reschedule your appointment, please contact [facility]  Dept: [phone]  and follow the prompts.  Office hours are 8:00 a.m. to 4:30 p.m. Monday - Friday. Please note that voicemails left after 4:00 p.m. may not be returned until the following business day.  We are closed weekends and major holidays. You have access to a nurse at all times for urgent questions. Please call the main number to the clinic Dept: [phone] and follow the prompts.   For any non-urgent questions, you may also contact your provider using MyChart. We now offer e-Visits for anyone 18 and older to request care online for non-urgent symptoms. For details visit [email or web address].   Also download the MyChart app! Go to the app store, search "MyChart", open the app, select [facility], and log in with your MyChart username and password.  Due to Covid, a mask is required upon entering the hospital/clinic. If you do not have a mask, one will be given to you upon arrival. For doctor visits, patients may have 1 support person aged 18 or older with them. For treatment visits, patients cannot have anyone with them due to current Covid guidelines and our immunocompromised population.  

## 2021-01-10 ENCOUNTER — Telehealth: Payer: Self-pay | Admitting: Hematology

## 2021-01-10 NOTE — Telephone Encounter (Signed)
Scheduled follow-up appointment per 8/30 los. Patient's daughter is aware.

## 2021-01-12 ENCOUNTER — Other Ambulatory Visit: Payer: Medicare Other

## 2021-01-12 ENCOUNTER — Ambulatory Visit: Payer: Medicare Other

## 2021-01-12 LAB — MULTIPLE MYELOMA PANEL, SERUM
Albumin SerPl Elph-Mcnc: 3.4 g/dL (ref 2.9–4.4)
Albumin/Glob SerPl: 1.1 (ref 0.7–1.7)
Alpha 1: 0.2 g/dL (ref 0.0–0.4)
Alpha2 Glob SerPl Elph-Mcnc: 0.9 g/dL (ref 0.4–1.0)
B-Globulin SerPl Elph-Mcnc: 1.5 g/dL — ABNORMAL HIGH (ref 0.7–1.3)
Gamma Glob SerPl Elph-Mcnc: 0.8 g/dL (ref 0.4–1.8)
Globulin, Total: 3.3 g/dL (ref 2.2–3.9)
IgA: 1093 mg/dL — ABNORMAL HIGH (ref 61–437)
IgG (Immunoglobin G), Serum: 589 mg/dL — ABNORMAL LOW (ref 603–1613)
IgM (Immunoglobulin M), Srm: 35 mg/dL (ref 15–143)
M Protein SerPl Elph-Mcnc: 0.4 g/dL — ABNORMAL HIGH
Total Protein ELP: 6.7 g/dL (ref 6.0–8.5)

## 2021-01-15 ENCOUNTER — Encounter: Payer: Self-pay | Admitting: Hematology

## 2021-01-15 NOTE — Progress Notes (Signed)
HEMATOLOGY/ONCOLOGY CLINIC NOTE  Date of Service: .01/09/2021   Patient Care Team: [name], MD as PCP - General (Family Medicine) [name], MD (Cardiology) [name], MD (Cardiology) [name], DO as Consulting Physician (Neurology)  CHIEF COMPLAINTS/PURPOSE OF CONSULTATION:  Continue mx of myeloma  HISTORY OF PRESENTING ILLNESS:   Jonathon [name] is a wonderful 85 y.o. male who has been referred to [place] by Dr. [name] for evaluation and management of Lytic bone lesions. He is accompanied today by his daughter. The pt reports that he is doing well overall. He is currently a resident at [facility] in [place], [place].   The pt presented to the ED on 03/24/18 with a right distal clavicle fracture, which occurred after trying to get out of bed and pushing himself up with his right arm. He notes that his energy has been noticeably decreased for the last month. He also notes that his lower back has a slight sting when trying to stand up from sitting. He also notes some pain in his lower, right chest wall, which is exacerbated when coughing. The pt has been taking 31m Tramadol which is controlling his pain. He does endorse some weakening of both of his lower extremities in the past 1-2 months, which he attributes somewhat to his recent lower back pain.   The pt denies any bowel abnormalities recently. He notes that he stays very well hydrated, and urinates 4-5 times a night, and does have BPH, and takes Flomax. The pt denies any recent changes in his urination habits.   The pt notes that his appetite decreased for a few months, until he began assisted living. He has lost 9 pounds in the last week.   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 He now uses a walker to ambulate. He notes that the CIDP symptoms are primarily located to his  legs. He sees Dr. [name] Neu[facility]. He took steroids without any symptomatic relief. IVIG was cost-prohibitive for the patient, but was recommended.    Regarding the patient's anemia in April 2019, he denies any obvious bleeding at that time, and denies surgeries.    The pt notes that his last colonoscopy, in 2015, was not concerning, which is corroborated by chart review.   The pt and daughter deny any concerns for memory issues, and the daughter adds his memory is "elephant-like". They are not sure why Namenda is on his medication list.   The pt denies any recent dental procedures, or concerns for dental issues. He has maintained every 6 month dental cleanings for all of his life.   Of note prior to the patient's visit today, pt has had a CT C/A/P completed on 03/31/18 with results revealing IMPRESSION: 1. Lytic lesion throughout all visualized vertebra from the lower cervical spine through the sacrum. Epidural extension of tumor T2, T3 and L2 level. Right L3-4 foraminal extension tumor. 2. Lytic lesions involving majority of ribs. Multiple pathologic rib fractures with bony expansion/sub pleural extension of tumor at several levels. 3. Lytic lesions of the scapula, hips bilaterally and pelvis bilaterally. With further progression of tumor, patient may be at risk for pathologic fractures. Pathologic fracture right clavicle incompletely assessed. 4. Circumferential narrowing sigmoid colon. Question possibility of underlying mass (series 2, image 97). Alternatively this could represent muscular hypertrophy/peristalsis. 5.  Gastric antrum circumferential narrowing. Cannot exclude mass although this may be related to peristalsis. 6. No primary lung malignancy noted. 7. Matted aortic pulmonary window lymph nodes. 8. Circumferential urinary bladder wall thickening greater anterior dome region. Right lateral bladder diverticulum. 9. 5 mm low-density lesion pancreatic body (series 5, image 10)  unchanged. This is felt unlikely to be a primary malignancy. 10. Right parapelvic 2.2 cm cyst without significant change. Scattered low-density renal lesions bila2terally too small to adequately characterize. Some were present previously. Statistically these are likely cysts. 11. Stable adrenal gland hyperplasia. 12. Gynecomastia. 13. Prostate gland causes slight impression upon the bladder base. 14.  Aortic Atherosclerosis. 15. Sequential pacemaker in place. 16. Gallstones.  Most recent lab results (03/25/18) of CBC w/diff and CMP is as follows: all values are WNL except for RBC at 4.14, HGB at 11.3, HCT at 33.6, Chloride at 94, CO2 at 33, Glucose at 100, BUN at 25, Total Protein at 9.4, Albumin at 3.4, Calcium at 13.8, GFR at 58. 03/25/18 PSA, Total was normal at 1.2  On review of systems, pt reports positional lower back pain, lower right chest wall pain, recently decreased energy levels, relatively weaker appetite, weight loss, right clavicle pain, weakening of the lower extremities, moving his bowels well, and denies changes in urination, abdominal pains, changes in breathing, changes in bowel habits, pain along the spine, leg swelling, and any other symptoms.  INTERVAL HISTORY:   DUSHAUN OKEY returns today for management and evaluation of his Multiple Myeloma. The patient's last visit with [place] was on 11/10/2020. The pt reports that he is doing well overall.   Patient notes no acute new symptoms.  Lab results today 01/09/2021 of CBC w/diff and CMP is as follows: all values are WNL except for Hgb of 12.2, HCT of 36.9. CMP stable. 11/14/2020 MMP - M spike stable ar 0.4g/dl  On review of systems, pt reports no other acute new symptoms.no infection issues.     MEDICAL HISTORY:  Past Medical History:  Diagnosis Date   Asthma    BENIGN PROSTATIC HYPERTROPHY, HX OF 10/25/2008   Bone metastases ([place]) 04/14/2018   BRADYCARDIA 2005   Chronic diastolic congestive heart failure ([place]) 06/05/2016   CIDP  (chronic inflammatory demyelinating polyneuropathy) ([place])    HYPERTENSION 11/21/2006   Iron deficiency anemia, unspecified 04/19/2013   Multiple myeloma ([place]) 04/29/2018   NEPHROLITHIASIS, HX OF 11/21/2006 and 10/15/2008   PACEMAKER, PERMANENT 2005   Gen change 2014 Medtronic Adaptic L dual-chamber pacemaker, serial #[id number]    SVT (supraventricular tachycardia) ([place])    TOBACCO ABUSE 10/25/2008   Quit 2012    SURGICAL HISTORY: Past Surgical History:  Procedure Laterality Date   COLONOSCOPY  2004,2009   PACEMAKER INSERTION  2005   PERMANENT PACEMAKER GENERATOR CHANGE N/A 06/19/2012   Procedure: PERMANENT PACEMAKER GENERATOR CHANGE;  Surgeon: [name], MD; Medtronic Adaptic L dual-chamber pacemaker, serial #[id number]     SKIN GRAFT Right 1962   wrist    SOCIAL HISTORY: Social History   Socioeconomic History   Marital status: Divorced    Spouse name: Not on file   Number of children: 4   Years of education: Not on file   Highest education level: Not on file  Occupational History   Occupation: ENVIROMENTAL SERVICE    Employer: [employer]   Occupation: retired  Tobacco Use   Smoking status: Former    Packs/day: 0.25    Years: 46.00    Pack years: 11.50  Types: Cigarettes    Start date: 03/16/1965    Quit date: 06/15/2010    Years since quitting: 10.5   Smokeless tobacco: Never   Tobacco comments:    quit 3 years ago  Vaping Use   Vaping Use: Never used  Substance and Sexual Activity   Alcohol use: No    Alcohol/week: 0.0 standard drinks   Drug use: No   Sexual activity: Not on file  Other Topics Concern   Not on file  Social History Narrative   Has relocated from [place] in 2007. 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  Patient in a facility thru [facility]   Social Determinants of Health   Financial Resource Strain: Not on file  Food Insecurity: Not on file  Transportation Needs: Not on file  Physical Activity: Not on file  Stress: Not on file  Social  Connections: Not on file  Intimate Partner Violence: Not on file    FAMILY HISTORY: Family History  Problem Relation Age of Onset   Heart attack Father        Died, 59   Kidney disease Father    Parkinson's disease Mother        Died, 62   Breast cancer Sister        Living, 48   Diabetes type II Brother        Living, 19   Breast cancer Sister        Living, 41   Diabetes Daughter        Living, 64   Diabetes Son        Living, 26   Ovarian cancer Daughter    Colon cancer Neg Hx    Rectal cancer Neg Hx    Stomach cancer Neg Hx     ALLERGIES:  is allergic to lexapro [escitalopram oxalate].  MEDICATIONS:  Current Outpatient Medications  Medication Sig Dispense Refill   acyclovir (ZOVIRAX) 400 MG tablet Take 1 tablet (400 mg total) by mouth 2 (two) times daily. 60 tablet 11   amoxicillin (AMOXIL) 250 MG capsule Take 250 mg by mouth 3 (three) times daily.     aspirin EC 81 MG tablet Take 81 mg by mouth daily.     diltiazem (TIAZAC) 240 MG 24 hr capsule Take 1 capsule by mouth daily.     flecainide (TAMBOCOR) 50 MG tablet Take 75 mg by mouth daily as needed.     furosemide (LASIX) 40 MG tablet Take 40 mg by mouth daily.     irbesartan (AVAPRO) 300 MG tablet Take 300 mg by mouth daily.     memantine (NAMENDA) 5 MG tablet Take 5 mg by mouth 2 (two) times daily.     metoprolol tartrate (LOPRESSOR) 50 MG tablet Take 1 tablet (50 mg total) by mouth 2 (two) times daily. 60 tablet 0   ondansetron (ZOFRAN) 8 MG tablet Take 1 tablet (8 mg total) by mouth 2 (two) times daily as needed (Nausea or vomiting). 30 tablet 1   polyethylene glycol (MIRALAX / GLYCOLAX) packet Take 17 g by mouth daily. 14 each 0   prochlorperazine (COMPAZINE) 10 MG tablet Take 1 tablet (10 mg total) by mouth every 6 (six) hours as needed (Nausea or vomiting). 30 tablet 1   Saccharomyces boulardii (FLORASTOR PO) Take 250 mg by mouth 2 (two) times daily.     tamsulosin (FLOMAX) 0.4 MG CAPS capsule Take 2 capsules  (0.8 mg total) by mouth daily. 90 capsule 0   No current facility-administered medications for this visit.  Facility-Administered Medications Ordered in Other Visits  Medication Dose Route Frequency Provider Last Rate Last Admin   [MAR Hold] sodium chloride flush (NS) 0.9 % injection 10 mL  10 mL Intracatheter PRN Brunetta Genera, MD        REVIEW OF SYSTEMS:   .10 Point review of Systems was done is negative except as noted above.   PHYSICAL EXAMINATION: ECOG PERFORMANCE STATUS: 2 - Symptomatic, <50% confined to bed  .BP 139/82 (BP Location: Left Arm, Patient Position: Sitting)   Pulse 73   Temp 98.2 F (36.8 C) (Oral)   Resp 17   Wt 207 lb 11.2 oz (94.2 kg)   SpO2 100%   BMI 28.97 kg/m   NAD. GENERAL:alert, in no acute distress and comfortable SKIN: no acute rashes, no significant lesions EYES: conjunctiva are pink and non-injected, sclera anicteric OROPHARYNX: MMM, no exudates, no oropharyngeal erythema or ulceration NECK: supple, no JVD LYMPH:  no palpable lymphadenopathy in the cervical, axillary or inguinal regions LUNGS: clear to auscultation b/l with normal respiratory effort HEART: regular rate & rhythm ABDOMEN:  normoactive bowel sounds , non tender, not distended. Extremity: no pedal edema PSYCH: alert & oriented x 3 with fluent speech NEURO: no focal motor/sensory deficits   LABORATORY DATA:  I have reviewed the data as listed  CBC Latest Ref Rng & Units 01/09/2021 12/12/2020 11/14/2020  WBC 4.0 - 10.5 K/uL 5.7 5.5 6.3  Hemoglobin 13.0 - 17.0 g/dL 12.3(L) 12.4(L) 12.2(L)  Hematocrit 39.0 - 52.0 % 36.2(L) 36.9(L) 36.9(L)  Platelets 150 - 400 K/uL 183 184 183    CMP Latest Ref Rng & Units 01/09/2021 12/12/2020 11/14/2020  Glucose 70 - 99 mg/dL 101(H) 109(H) 115(H)  BUN 8 - 23 mg/dL '11 12 14  ' Creatinine 0.61 - 1.24 mg/dL 1.03 1.05 1.09  Sodium 135 - 145 mmol/L 134(L) 136 136  Potassium 3.5 - 5.1 mmol/L 4.3 4.1 4.2  Chloride 98 - 111 mmol/L 101 100 102   CO2 22 - 32 mmol/L '24 29 24  ' Calcium 8.9 - 10.3 mg/dL 9.2 9.3 9.0  Total Protein 6.5 - 8.1 g/dL 7.5 7.7 7.3  Total Bilirubin 0.3 - 1.2 mg/dL 0.7 0.9 0.7  Alkaline Phos 38 - 126 U/L 77 63 75  AST 15 - 41 U/L '18 21 17  ' ALT 0 - 44 U/L '10 11 8   ' 12/14/2018 MMP    12/14/2018 MMP    04/17/18 BM Bx:    RADIOGRAPHIC STUDIES: I have personally reviewed the radiological images as listed and agreed with the findings in the report. CUP PACEART REMOTE DEVICE CHECK  Result Date: 01/03/2021 Scheduled remote reviewed. Normal device function.  Next remote 91 days. LR   ASSESSMENT & PLAN:  85 y.o. male with  1. Lytic bone lesions - w/u consistent with newly diagnosed multiple myeloma Labs upon initial presentation from 04/14/18; hypercalcemic with Calcium at 13.8, renal function up form baseline with Creatinine at 1.48, HGB at 11.3. PSA was normal.   03/31/18 CT C/A/P revealed 1. Lytic lesion throughout all visualized vertebra from the lower cervical spine through the sacrum. Epidural extension of tumor T2, T3 and L2 level. Right L3-4 foraminal extension tumor. 2. Lytic lesions involving majority of ribs. Multiple pathologic rib fractures with bony expansion/sub pleural extension of tumor at several levels. 3. Lytic lesions of the scapula, hips bilaterally and pelvis bilaterally. With further progression of tumor, patient may be at risk for pathologic fractures. Pathologic fracture right clavicle incompletely assessed. 4. Circumferential narrowing  sigmoid colon. Question possibility of underlying mass (series 2, image 97). Alternatively this could represent muscular hypertrophy/peristalsis. 5. Gastric antrum circumferential narrowing. Cannot exclude mass although this may be related to peristalsis. 6. No primary lung malignancy noted. 7. Matted aortic pulmonary window lymph nodes. 8. Circumferential urinary bladder wall thickening greater anterior dome region. Right lateral bladder diverticulum. 9. 5 mm  low-density lesion pancreatic body (series 5, image 10) unchanged. This is felt unlikely to be a primary malignancy. 10. Right parapelvic 2.2 cm cyst without significant change. Scattered low-density renal lesions bilaterally too small to adequately characterize. Some were present previously. Statistically these are likely cysts. 11. Stable adrenal gland hyperplasia. 12. Gynecomastia. 13. Prostate gland causes slight impression upon the bladder base. 14.  Aortic Atherosclerosis. 15. Sequential pacemaker in place. 16. Gallstones.   04/23/18 PET/CT revealed Innumerable hypermetabolic lytic lesions throughout the skeleton especially involving the spine, ribs, bony pelvis along with some involvement of both proximal femurs. The appearance is compatible with pathologic diagnosis of plasma cell neoplasm could well reflect multiple myeloma. Resulting pathologic fractures of the right lateral clavicle and of multiple bilateral ribs. Lesions are present along the cortical margins of the spinal canal. 2. Both the area and the stomach in the area in the sigmoid colon drawn attention to on prior CT scan appear benign normal today. 3. The confluence of AP window lymph nodes measures about 1.2 cm in short axis with maximum SUV 3.1, which is mildly above the blood pool merit surveillance. 4. Other imaging findings of potential clinical significance: Aortic Atherosclerosis. Coronary atherosclerosis. Pacemaker noted. Borderline prostatomegaly. Cholelithiasis.  04/21/18 BM Bx revealed Normocellular bone marrow with Plasma Cell neoplasm. Scattered medium sized clusters of kappa-restricted plasma cells (14% aspirate, 10% CD138 immunohistochemistry.   I do suspect that the burden of disease is underestimated in the bone marrow sample given PET/CT findings of innumerable lytic lesions and an M spike of 3.4g   06/08/18 DG Hips bilat which revealed Innumerable lucencies are noted throughout the pelvis and proximal femurs, similar  findings noted on prior PET-CT of 04/23/2018. Findings are consistent patient's known multiple myeloma. Femoral necks are intact. 2.  Aortoiliac atherosclerotic vascular disease.  2. Pathologic right clavicle fracture due to myeloma- resolved 3. Hypercalcemia- resolved.   PLAN:    -Discussed pt labwork today, 8/303/2022; blood counts stable, cmp- unremarkable. -myeloma panel M spike steady at 0.4g/dl -Will continue with Daratumumab as per current plan unless signs of overt progression. -The pt has no prohibitive toxicities from continuing q4weeks Daratumumab at this time. -Continue OTC Vitamin D. -Hold Xgeva at this time until dental issues are resolved. -Continue stationary bike and physical activity daily.  -Will see back in 2 months with labs. -pet/ct to evaluate for bone lesions   FOLLOW UP: PET/CT in 3 weeks Return to clinic with next cycle of daratumumab in 4 weeks with labs and MD visit.  . The total time spent in the appointment was 30 minutes and more than 50% was on counseling and direct patient cares, ordering and mx of Dara    All of the patient's questions were answered with apparent satisfaction. The patient knows to call the clinic with any problems, questions or concerns.    Sullivan Lone MD [place] AAHIVMS [facility] Hematology/Oncology Physician [facility]  (Office):       [phone] (Work cell):  [phone] (Fax):           [phone]

## 2021-01-18 NOTE — Progress Notes (Signed)
Remote pacemaker transmission.   

## 2021-01-22 ENCOUNTER — Telehealth: Payer: Self-pay

## 2021-01-22 ENCOUNTER — Other Ambulatory Visit: Payer: Self-pay | Admitting: Family Medicine

## 2021-01-22 DIAGNOSIS — U071 COVID-19: Secondary | ICD-10-CM

## 2021-01-22 MED ORDER — GUAIFENESIN 100 MG/5ML PO SOLN: 5.0000 mL | Freq: Four times a day (QID) | ORAL | 0 refills | Status: AC | PRN

## 2021-01-22 NOTE — Telephone Encounter (Signed)
Spoke with Mickel Baas from [facility], stated pt tested positive on 9/8. Now has deep cough and congestion. Originally stated he tested on Tue., spoke with Dr Volanda Napoleon and gave info, out of time frame for antiviral, can now treat symptoms. Spoke with [name], daughter, and advised her of Dr Volanda Napoleon recommendation. She stated she can not take him OTC medications, Dr Volanda Napoleon provided prescription for cough medicine.

## 2021-01-22 NOTE — Telephone Encounter (Signed)
Returned call to daughter per her request . Pt has Covid and was asking about cough medication, daughter to call PCP for cough prn. Pt developed COVID on 01/18/21. Pt able to return to [facility] on 02/07/21 or after. Will reschedule pt's next appointment.

## 2021-01-22 NOTE — Progress Notes (Signed)
Per SNF pt with symptom starting Tuesday 9/6.  COVID testing negative on 9/6, then positive on 9/8.  This office made aware of pt's positive COVID result on 9/12 when medication was requested for pt's cough.  Rx for guaifenesin to be faxed to SNF.

## 2021-01-22 NOTE — Telephone Encounter (Signed)
Jonathon [name] from [facility] stating patient was Dx with Covid and would like an order for a prescription faxed  # [phone] call back # [phone]

## 2021-01-22 NOTE — Telephone Encounter (Signed)
Daughter of patient called in regards to previous message daughter stated urgency and a call back Carron Nee # [phone]

## 2021-01-23 ENCOUNTER — Telehealth: Payer: Self-pay | Admitting: Hematology

## 2021-01-23 NOTE — Telephone Encounter (Signed)
Scheduled per sch msg.. called and left msg.

## 2021-01-23 NOTE — Telephone Encounter (Signed)
Prescription faxed

## 2021-02-02 DIAGNOSIS — Z23 Encounter for immunization: Secondary | ICD-10-CM | POA: Diagnosis not present

## 2021-02-05 ENCOUNTER — Ambulatory Visit (HOSPITAL_COMMUNITY): Payer: Medicare Other

## 2021-02-06 ENCOUNTER — Ambulatory Visit: Payer: Medicare Other | Admitting: Hematology

## 2021-02-06 ENCOUNTER — Other Ambulatory Visit: Payer: Medicare Other

## 2021-02-06 ENCOUNTER — Other Ambulatory Visit: Payer: Self-pay

## 2021-02-06 ENCOUNTER — Ambulatory Visit: Payer: Medicare Other

## 2021-02-06 ENCOUNTER — Encounter (HOSPITAL_COMMUNITY)
Admission: RE | Admit: 2021-02-06 | Discharge: 2021-02-06 | Disposition: A | Discharge status: Home / Self Care | Payer: Medicare Other | Source: Ambulatory Visit | Attending: Hematology | Admitting: Hematology

## 2021-02-06 DIAGNOSIS — C9002 Multiple myeloma in relapse: Secondary | ICD-10-CM | POA: Diagnosis not present

## 2021-02-06 DIAGNOSIS — Z79899 Other long term (current) drug therapy: Secondary | ICD-10-CM | POA: Insufficient documentation

## 2021-02-06 DIAGNOSIS — C9000 Multiple myeloma not having achieved remission: Secondary | ICD-10-CM | POA: Diagnosis not present

## 2021-02-06 LAB — GLUCOSE, CAPILLARY: Glucose-Capillary: 119 mg/dL — ABNORMAL HIGH (ref 70–99)

## 2021-02-06 MED ORDER — FLUDEOXYGLUCOSE F - 18 (FDG) INJECTION
10.2000 | Freq: Once | INTRAVENOUS | Status: AC | PRN
  Administered 2021-02-06: 10.2 via INTRAVENOUS

## 2021-02-13 ENCOUNTER — Inpatient Hospital Stay: Payer: Medicare Other | Attending: Hematology

## 2021-02-13 ENCOUNTER — Inpatient Hospital Stay: Payer: Medicare Other

## 2021-02-13 ENCOUNTER — Other Ambulatory Visit: Payer: Self-pay

## 2021-02-13 ENCOUNTER — Inpatient Hospital Stay (HOSPITAL_BASED_OUTPATIENT_CLINIC_OR_DEPARTMENT_OTHER): Payer: Medicare Other | Admitting: Hematology

## 2021-02-13 VITALS — BP 145/75 | HR 72 | Temp 97.8°F | Resp 17 | Ht 71.0 in | Wt 200.3 lb

## 2021-02-13 VITALS — BP 148/79 | HR 68 | Resp 18

## 2021-02-13 DIAGNOSIS — M79674 Pain in right toe(s): Secondary | ICD-10-CM | POA: Diagnosis not present

## 2021-02-13 DIAGNOSIS — C9001 Multiple myeloma in remission: Secondary | ICD-10-CM

## 2021-02-13 DIAGNOSIS — C9000 Multiple myeloma not having achieved remission: Secondary | ICD-10-CM | POA: Diagnosis not present

## 2021-02-13 DIAGNOSIS — C9002 Multiple myeloma in relapse: Secondary | ICD-10-CM

## 2021-02-13 DIAGNOSIS — C7951 Secondary malignant neoplasm of bone: Secondary | ICD-10-CM

## 2021-02-13 DIAGNOSIS — M79675 Pain in left toe(s): Secondary | ICD-10-CM | POA: Diagnosis not present

## 2021-02-13 DIAGNOSIS — Z5112 Encounter for antineoplastic immunotherapy: Secondary | ICD-10-CM

## 2021-02-13 DIAGNOSIS — Z7189 Other specified counseling: Secondary | ICD-10-CM

## 2021-02-13 DIAGNOSIS — B351 Tinea unguium: Secondary | ICD-10-CM | POA: Diagnosis not present

## 2021-02-13 LAB — CBC WITH DIFFERENTIAL/PLATELET
Abs Immature Granulocytes: 0.01 10*3/uL (ref 0.00–0.07)
Basophils Absolute: 0.1 10*3/uL (ref 0.0–0.1)
Basophils Relative: 1 %
Eosinophils Absolute: 0.4 10*3/uL (ref 0.0–0.5)
Eosinophils Relative: 7 %
HCT: 36.3 % — ABNORMAL LOW (ref 39.0–52.0)
Hemoglobin: 12.1 g/dL — ABNORMAL LOW (ref 13.0–17.0)
Immature Granulocytes: 0 %
Lymphocytes Relative: 36 %
Lymphs Abs: 2.2 10*3/uL (ref 0.7–4.0)
MCH: 27.3 pg (ref 26.0–34.0)
MCHC: 33.3 g/dL (ref 30.0–36.0)
MCV: 81.8 fL (ref 80.0–100.0)
Monocytes Absolute: 0.7 10*3/uL (ref 0.1–1.0)
Monocytes Relative: 11 %
Neutro Abs: 2.8 10*3/uL (ref 1.7–7.7)
Neutrophils Relative %: 45 %
Platelets: 188 10*3/uL (ref 150–400)
RBC: 4.44 MIL/uL (ref 4.22–5.81)
RDW: 13.5 % (ref 11.5–15.5)
WBC: 6.2 10*3/uL (ref 4.0–10.5)
nRBC: 0 % (ref 0.0–0.2)

## 2021-02-13 LAB — CMP (CANCER CENTER ONLY)
ALT: 9 U/L (ref 0–44)
AST: 19 U/L (ref 15–41)
Albumin: 3.5 g/dL (ref 3.5–5.0)
Alkaline Phosphatase: 84 U/L (ref 38–126)
Anion gap: 10 (ref 5–15)
BUN: 11 mg/dL (ref 8–23)
CO2: 23 mmol/L (ref 22–32)
Calcium: 9.5 mg/dL (ref 8.9–10.3)
Chloride: 97 mmol/L — ABNORMAL LOW (ref 98–111)
Creatinine: 0.94 mg/dL (ref 0.61–1.24)
GFR, Estimated: >60 mL/min
Glucose, Bld: 109 mg/dL — ABNORMAL HIGH (ref 70–99)
Potassium: 4.2 mmol/L (ref 3.5–5.1)
Sodium: 130 mmol/L — ABNORMAL LOW (ref 135–145)
Total Bilirubin: 0.9 mg/dL (ref 0.3–1.2)
Total Protein: 7.7 g/dL (ref 6.5–8.1)

## 2021-02-13 MED ORDER — DIPHENHYDRAMINE HCL 25 MG PO CAPS
50.0000 mg | ORAL_CAPSULE | Freq: Once | ORAL | Status: AC
  Administered 2021-02-13: 50 mg via ORAL
  Filled 2021-02-13: qty 2

## 2021-02-13 MED ORDER — SODIUM CHLORIDE 0.9 % IV SOLN
20.0000 mg | Freq: Once | INTRAVENOUS | Status: AC
  Administered 2021-02-13: 20 mg via INTRAVENOUS
  Filled 2021-02-13: qty 20

## 2021-02-13 MED ORDER — ACETAMINOPHEN 325 MG PO TABS
650.0000 mg | ORAL_TABLET | Freq: Once | ORAL | Status: AC
  Administered 2021-02-13: 650 mg via ORAL
  Filled 2021-02-13: qty 2

## 2021-02-13 MED ORDER — MONTELUKAST SODIUM 10 MG PO TABS
10.0000 mg | ORAL_TABLET | Freq: Once | ORAL | Status: AC
  Administered 2021-02-13: 10 mg via ORAL
  Filled 2021-02-13: qty 1

## 2021-02-13 MED ORDER — TRAMADOL HCL 50 MG PO TABS: 50.0000 mg | ORAL_TABLET | Freq: Four times a day (QID) | ORAL | 0 refills | Status: DC | PRN

## 2021-02-13 MED ORDER — FAMOTIDINE 20 MG IN NS 100 ML IVPB
20.0000 mg | Freq: Once | INTRAVENOUS | Status: AC
  Administered 2021-02-13: 20 mg via INTRAVENOUS
  Filled 2021-02-13: qty 100

## 2021-02-13 MED ORDER — SODIUM CHLORIDE 0.9 % IV SOLN: Freq: Once | INTRAVENOUS | Status: AC

## 2021-02-13 MED ORDER — SODIUM CHLORIDE 0.9 % IV SOLN
16.0000 mg/kg | Freq: Once | INTRAVENOUS | Status: AC
  Administered 2021-02-13: 1600 mg via INTRAVENOUS
  Filled 2021-02-13: qty 80

## 2021-02-13 NOTE — Patient Instructions (Signed)
[facility]  Discharge Instructions: Thank you for choosing [facility] to provide your oncology and hematology care.   If you have a lab appointment with the Cancer Center, please go directly to the Cancer Center and check in at the registration area.   Wear comfortable clothing and clothing appropriate for easy access to any Portacath or PICC line.   We strive to give you quality time with your provider. You may need to reschedule your appointment if you arrive late (15 or more minutes).  Arriving late affects you and other patients whose appointments are after yours.  Also, if you miss three or more appointments without notifying the office, you may be dismissed from the clinic at the provider's discretion.      For prescription refill requests, have your pharmacy contact our office and allow 72 hours for refills to be completed.    Today you received the following chemotherapy and/or immunotherapy agents darzalex      To help prevent nausea and vomiting after your treatment, we encourage you to take your nausea medication as directed.  BELOW ARE SYMPTOMS THAT SHOULD BE REPORTED IMMEDIATELY: *FEVER GREATER THAN 100.4 F (38 C) OR HIGHER *CHILLS OR SWEATING *NAUSEA AND VOMITING THAT IS NOT CONTROLLED WITH YOUR NAUSEA MEDICATION *UNUSUAL SHORTNESS OF BREATH *UNUSUAL BRUISING OR BLEEDING *URINARY PROBLEMS (pain or burning when urinating, or frequent urination) *BOWEL PROBLEMS (unusual diarrhea, constipation, pain near the anus) TENDERNESS IN MOUTH AND THROAT WITH OR WITHOUT PRESENCE OF ULCERS (sore throat, sores in mouth, or a toothache) UNUSUAL RASH, SWELLING OR PAIN  UNUSUAL VAGINAL DISCHARGE OR ITCHING   Items with * indicate a potential emergency and should be followed up as soon as possible or go to the Emergency Department if any problems should occur.  Please show the CHEMOTHERAPY ALERT CARD or IMMUNOTHERAPY ALERT CARD at check-in to  the Emergency Department and triage nurse.  Should you have questions after your visit or need to cancel or reschedule your appointment, please contact [facility]  Dept: [phone]  and follow the prompts.  Office hours are 8:00 a.m. to 4:30 p.m. Monday - Friday. Please note that voicemails left after 4:00 p.m. may not be returned until the following business day.  We are closed weekends and major holidays. You have access to a nurse at all times for urgent questions. Please call the main number to the clinic Dept: [phone] and follow the prompts.   For any non-urgent questions, you may also contact your provider using MyChart. We now offer e-Visits for anyone 18 and older to request care online for non-urgent symptoms. For details visit [email or web address].   Also download the MyChart app! Go to the app store, search "MyChart", open the app, select [facility], and log in with your MyChart username and password.  Due to Covid, a mask is required upon entering the hospital/clinic. If you do not have a mask, one will be given to you upon arrival. For doctor visits, patients may have 1 support person aged 18 or older with them. For treatment visits, patients cannot have anyone with them due to current Covid guidelines and our immunocompromised population.  

## 2021-02-13 NOTE — Progress Notes (Signed)
Confirmed Darzalex dose w/ Dr. Irene Limbo - keep at 1600 mg. Pt has lost wt b/c he had COVID.  Kennith Center, Pharm.D., CPP 02/13/2021@9 :59 AM

## 2021-02-13 NOTE — Progress Notes (Signed)
HEMATOLOGY/ONCOLOGY CLINIC NOTE  Date of Service: .02/13/2021   Patient Care Team: [name], MD as PCP - General (Family Medicine) [name], MD (Cardiology) [name], MD (Cardiology) [name], DO as Consulting Physician (Neurology)  CHIEF COMPLAINTS/PURPOSE OF CONSULTATION:  Continue mx of myeloma  HISTORY OF PRESENTING ILLNESS:   Jonathon [name] is a wonderful 85 y.o. male who has been referred to [place] by Dr. [name] for evaluation and management of Lytic bone lesions. He is accompanied today by his daughter. The pt reports that he is doing well overall. He is currently a resident at [facility] in [place], [place].   The pt presented to the ED on 03/24/18 with a right distal clavicle fracture, which occurred after trying to get out of bed and pushing himself up with his right arm. He notes that his energy has been noticeably decreased for the last month. He also notes that his lower back has a slight sting when trying to stand up from sitting. He also notes some pain in his lower, right chest wall, which is exacerbated when coughing. The pt has been taking 72m Tramadol which is controlling his pain. He does endorse some weakening of both of his lower extremities in the past 1-2 months, which he attributes somewhat to his recent lower back pain.   The pt denies any bowel abnormalities recently. He notes that he stays very well hydrated, and urinates 4-5 times a night, and does have BPH, and takes Flomax. The pt denies any recent changes in his urination habits.   The pt notes that his appetite decreased for a few months, until he began assisted living. He has lost 9 pounds in the last week.   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 He now uses a walker to ambulate. He notes that the CIDP symptoms are primarily located to his  legs. He sees Dr. [name] Neurology. He took steroids without any symptomatic relief. IVIG was cost-prohibitive for the patient, but was recommended.    Regarding the patient's anemia in April 2019, he denies any obvious bleeding at that time, and denies surgeries.    The pt notes that his last colonoscopy, in 2015, was not concerning, which is corroborated by chart review.   The pt and daughter deny any concerns for memory issues, and the daughter adds his memory is "elephant-like". They are not sure why Namenda is on his medication list.   The pt denies any recent dental procedures, or concerns for dental issues. He has maintained every 6 month dental cleanings for all of his life.   Of note prior to the patient's visit today, pt has had a CT C/A/P completed on 03/31/18 with results revealing IMPRESSION: 1. Lytic lesion throughout all visualized vertebra from the lower cervical spine through the sacrum. Epidural extension of tumor T2, T3 and L2 level. Right L3-4 foraminal extension tumor. 2. Lytic lesions involving majority of ribs. Multiple pathologic rib fractures with bony expansion/sub pleural extension of tumor at several levels. 3. Lytic lesions of the scapula, hips bilaterally and pelvis bilaterally. With further progression of tumor, patient may be at risk for pathologic fractures. Pathologic fracture right clavicle incompletely assessed. 4. Circumferential narrowing sigmoid colon. Question possibility of underlying mass (series 2, image 97). Alternatively this could represent muscular hypertrophy/peristalsis. 5.  Gastric antrum circumferential narrowing. Cannot exclude mass although this may be related to peristalsis. 6. No primary lung malignancy noted. 7. Matted aortic pulmonary window lymph nodes. 8. Circumferential urinary bladder wall thickening greater anterior dome region. Right lateral bladder diverticulum. 9. 5 mm low-density lesion pancreatic body (series 5, image 10)  unchanged. This is felt unlikely to be a primary malignancy. 10. Right parapelvic 2.2 cm cyst without significant change. Scattered low-density renal lesions bila2terally too small to adequately characterize. Some were present previously. Statistically these are likely cysts. 11. Stable adrenal gland hyperplasia. 12. Gynecomastia. 13. Prostate gland causes slight impression upon the bladder base. 14.  Aortic Atherosclerosis. 15. Sequential pacemaker in place. 16. Gallstones.  Most recent lab results (03/25/18) of CBC w/diff and CMP is as follows: all values are WNL except for RBC at 4.14, HGB at 11.3, HCT at 33.6, Chloride at 94, CO2 at 33, Glucose at 100, BUN at 25, Total Protein at 9.4, Albumin at 3.4, Calcium at 13.8, GFR at 58. 03/25/18 PSA, Total was normal at 1.2  On review of systems, pt reports positional lower back pain, lower right chest wall pain, recently decreased energy levels, relatively weaker appetite, weight loss, right clavicle pain, weakening of the lower extremities, moving his bowels well, and denies changes in urination, abdominal pains, changes in breathing, changes in bowel habits, pain along the spine, leg swelling, and any other symptoms.  INTERVAL HISTORY:   Jonathon [name] returns today for management and evaluation of his Multiple Myeloma. The patient's last visit with [place] was on 01/09/2021.   Patient notes he had a COVID infection about a month ago but now has completely resolved with no residual symptoms. Notes that his appetite had dropped off a little bit but he is starting to again eat better.  Lab results today 02/13/2021 CBC shows hemoglobin of 12.1 with normal WBC count and platelets CMP shows mild hyponatremia with sodium of 130 but otherwise unremarkable Myeloma panel-shows M spike of 0.6+0.4 equal to 1 g/dL  Patient does note some chest wall plan and lower back pain.  PET CT scan from 02/06/2021 shows 1. 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 2. Large expansile soft tissue mass extending through the LEFT chest wall with intense metabolic activity. 3. Large expansile mass originating in the body of the RIGHT scapula  and extending into the posterior musculature with intense metabolic activity. 4. Soft tissue nodule in the midline paraspinal subcutaneous tissue and a lesion in the LEFT gluteus muscle consistent with soft tissue metastasis. 5. Single nodal metastasis in the gastrohepatic ligament of the upper abdomen . 6. Lytic lesion with in the LEFT sacral ala with hypermetabolic activity.7. Multiple additional sclerotic lesions without metabolic activity consistent with dormant/treated multiple myeloma.    MEDICAL HISTORY:  Past Medical History:  Diagnosis Date   Asthma    BENIGN PROSTATIC HYPERTROPHY, HX OF 10/25/2008   Bone metastases ([place]) 04/14/2018   BRADYCARDIA 2005   Chronic diastolic congestive heart failure ([place]) 06/05/2016   CIDP (chronic inflammatory demyelinating polyneuropathy) ([place])    HYPERTENSION 11/21/2006   Iron deficiency anemia, unspecified 04/19/2013   Multiple myeloma ([place]) 04/29/2018   NEPHROLITHIASIS, HX OF 11/21/2006 and 10/15/2008   PACEMAKER, PERMANENT 2005   Gen change 2014 Medtronic Adaptic L dual-chamber pacemaker, serial #[id number]    SVT (supraventricular tachycardia) ([place])    TOBACCO ABUSE 10/25/2008   Quit 2012    SURGICAL HISTORY: Past Surgical History:  Procedure Laterality Date  COLONOSCOPY  0277,4128   PACEMAKER INSERTION  2005   PERMANENT PACEMAKER GENERATOR CHANGE N/A 06/19/2012   Procedure: PERMANENT PACEMAKER GENERATOR CHANGE;  Surgeon: [name], MD; Medtronic Adaptic L dual-chamber pacemaker, serial #[id number]     SKIN GRAFT Right 1962   wrist    SOCIAL HISTORY: Social History   Socioeconomic History   Marital status: Divorced    Spouse name: Not on file   Number of children: 4   Years of education: Not on file   Highest education level: Not on file   Occupational History   Occupation: ENVIROMENTAL SERVICE    Employer: [employer]   Occupation: retired  Tobacco Use   Smoking status: Former    Packs/day: 0.25    Years: 46.00    Pack years: 11.50    Types: Cigarettes    Start date: 03/16/1965    Quit date: 06/15/2010    Years since quitting: 10.6   Smokeless tobacco: Never   Tobacco comments:    quit 3 years ago  Vaping Use   Vaping Use: Never used  Substance and Sexual Activity   Alcohol use: No    Alcohol/week: 0.0 standard drinks   Drug use: No   Sexual activity: Not on file  Other Topics Concern   Not on file  Social History Narrative   Has relocated from [place] in 2007. 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  Patient in a facility thru [facility]   Social Determinants of Health   Financial Resource Strain: Not on file  Food Insecurity: Not on file  Transportation Needs: Not on file  Physical Activity: Not on file  Stress: Not on file  Social Connections: Not on file  Intimate Partner Violence: Not on file    FAMILY HISTORY: Family History  Problem Relation Age of Onset   Heart attack Father        Died, 2   Kidney disease Father    Parkinson's disease Mother        Died, 29   Breast cancer Sister        Living, 9   Diabetes type II Brother        Living, 54   Breast cancer Sister        Living, 72   Diabetes Daughter        Living, 3   Diabetes Son        Living, 79   Ovarian cancer Daughter    Colon cancer Neg Hx    Rectal cancer Neg Hx    Stomach cancer Neg Hx     ALLERGIES:  is allergic to lexapro [escitalopram oxalate].  MEDICATIONS:  Current Outpatient Medications  Medication Sig Dispense Refill   acyclovir (ZOVIRAX) 400 MG tablet Take 1 tablet (400 mg total) by mouth 2 (two) times daily. 60 tablet 11   amoxicillin (AMOXIL) 250 MG capsule Take 250 mg by mouth 3 (three) times daily.     aspirin EC 81 MG tablet Take 81 mg by mouth daily.     diltiazem (TIAZAC) 240 MG 24 hr capsule Take 1 capsule  by mouth daily.     flecainide (TAMBOCOR) 50 MG tablet Take 75 mg by mouth daily as needed.     furosemide (LASIX) 40 MG tablet Take 40 mg by mouth daily.     irbesartan (AVAPRO) 300 MG tablet Take 300 mg by mouth daily.     memantine (NAMENDA) 5 MG tablet Take 5 mg by mouth 2 (two) times daily.  metoprolol tartrate (LOPRESSOR) 50 MG tablet Take 1 tablet (50 mg total) by mouth 2 (two) times daily. 60 tablet 0   ondansetron (ZOFRAN) 8 MG tablet Take 1 tablet (8 mg total) by mouth 2 (two) times daily as needed (Nausea or vomiting). 30 tablet 1   polyethylene glycol (MIRALAX / GLYCOLAX) packet Take 17 g by mouth daily. 14 each 0   prochlorperazine (COMPAZINE) 10 MG tablet Take 1 tablet (10 mg total) by mouth every 6 (six) hours as needed (Nausea or vomiting). 30 tablet 1   Saccharomyces boulardii (FLORASTOR PO) Take 250 mg by mouth 2 (two) times daily.     tamsulosin (FLOMAX) 0.4 MG CAPS capsule Take 2 capsules (0.8 mg total) by mouth daily. 90 capsule 0   traMADol (ULTRAM) 50 MG tablet Take 1 tablet (50 mg total) by mouth every 6 (six) hours as needed. 30 tablet 0   No current facility-administered medications for this visit.   Facility-Administered Medications Ordered in Other Visits  Medication Dose Route Frequency Provider Last Rate Last Admin   [MAR Hold] sodium chloride flush (NS) 0.9 % injection 10 mL  10 mL Intracatheter PRN Brunetta Genera, MD        REVIEW OF SYSTEMS:   .10 Point review of Systems was done is negative except as noted above.  PHYSICAL EXAMINATION: ECOG PERFORMANCE STATUS: 2 - Symptomatic, <50% confined to bed  .BP (!) 145/75 (BP Location: Left Arm, Patient Position: Sitting) Comment: nurse notified  Pulse 72   Temp 97.8 F (36.6 C) (Oral)   Resp 17   Ht '5\' 11"'  (1.803 m)   Wt 200 lb 4.8 oz (90.9 kg)   SpO2 100%   BMI 27.94 kg/m  . GENERAL:alert, in no acute distress and comfortable SKIN: no acute rashes, no significant lesions EYES: conjunctiva are  pink and non-injected, sclera anicteric OROPHARYNX: MMM, no exudates, no oropharyngeal erythema or ulceration NECK: supple, no JVD LYMPH:  no palpable lymphadenopathy in the cervical, axillary or inguinal regions LUNGS: clear to auscultation b/l with normal respiratory effort HEART: regular rate & rhythm ABDOMEN:  normoactive bowel sounds , non tender, not distended. Extremity: no pedal edema PSYCH: alert & oriented x 3 with fluent speech NEURO: no focal motor/sensory deficits   LABORATORY DATA:  I have reviewed the data as listed  CBC Latest Ref Rng & Units 02/13/2021 01/09/2021 12/12/2020  WBC 4.0 - 10.5 K/uL 6.2 5.7 5.5  Hemoglobin 13.0 - 17.0 g/dL 12.1(L) 12.3(L) 12.4(L)  Hematocrit 39.0 - 52.0 % 36.3(L) 36.2(L) 36.9(L)  Platelets 150 - 400 K/uL 188 183 184    CMP Latest Ref Rng & Units 02/13/2021 01/09/2021 12/12/2020  Glucose 70 - 99 mg/dL 109(H) 101(H) 109(H)  BUN 8 - 23 mg/dL '11 11 12  ' Creatinine 0.61 - 1.24 mg/dL 0.94 1.03 1.05  Sodium 135 - 145 mmol/L 130(L) 134(L) 136  Potassium 3.5 - 5.1 mmol/L 4.2 4.3 4.1  Chloride 98 - 111 mmol/L 97(L) 101 100  CO2 22 - 32 mmol/L '23 24 29  ' Calcium 8.9 - 10.3 mg/dL 9.5 9.2 9.3  Total Protein 6.5 - 8.1 g/dL 7.7 7.5 7.7  Total Bilirubin 0.3 - 1.2 mg/dL 0.9 0.7 0.9  Alkaline Phos 38 - 126 U/L 84 77 63  AST 15 - 41 U/L '19 18 21  ' ALT 0 - 44 U/L '9 10 11     ' 04/17/18 BM Bx:    RADIOGRAPHIC STUDIES: I have personally reviewed the radiological images as listed and agreed with  the findings in the report. NM PET Image Restage (PS) Whole Body  Result Date: 02/06/2021 CLINICAL DATA:  Subsequent treatment strategy for multiple myeloma. EXAM: NUCLEAR MEDICINE PET WHOLE BODY TECHNIQUE: 10.2 mCi F-18 FDG was injected intravenously. Full-ring PET imaging was performed from the head to foot after the radiotracer. CT data was obtained and used for attenuation correction and anatomic localization. Fasting blood glucose: 119 mg/dl COMPARISON:  PET-CT  scan 04/23/2018 FINDINGS: Mediastinal blood pool activity: SUV max 2.5 HEAD/NECK: No hypermetabolic activity in the scalp. No hypermetabolic cervical lymph nodes. Incidental CT findings: none CHEST: Incidental CT findings: Large LEFT lateral chest wall mass measuring 7.3 cm (image 91/CT series 4) has intense metabolic activity SUV max equal 12.6. This mass extends into the lung parenchyma in through the chest wall into the LEFT axilla. The mass originates from the LEFT lateral fifth rib. No additional pulmonary nodules.  No mediastinal lymphadenopathy. ABDOMEN/PELVIS: Single enlarged node in the gastrohepatic ligament measuring 19 mm (image 137) with moderate metabolic activity (SUV max equal 5). No abnormal metabolic activity liver. Hypermetabolic nodule in the subcutaneous tissue of the back just RIGHT of the L4 spinous process measuring 26 mm SUV max equal 8.7. Image 161. Small nodule within the LEFT gluteus muscle with SUV max equal 6.0 on image 193. Probable small lesion noted in the muscle. There is some misregistration. Incidental CT findings: none SKELETON: Expansile hypermetabolic mass expanding the body of the RIGHT scapula and extending into the posterior musculature measuring 7 cm with SUV max equal 21.9 (image 72) Lytic lesion in the LEFT sacral ala with SUV max equal 11.4. soft tissue extension into the musculature noted on image 173/series 4 measuring 3 cm. Incidental CT findings: There multiple additional sclerotic lesions at site of prior hypermetabolic melanoma however these are now non metabolically active. EXTREMITIES: No abnormal hypermetabolic activity in the lower extremities. Incidental CT findings: none IMPRESSION: 1. 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 2. Large expansile soft tissue mass extending through the LEFT chest wall with intense metabolic activity. 3. Large expansile mass originating in the body of the RIGHT scapula and extending into the  posterior musculature with intense metabolic activity. 4. Soft tissue nodule in the midline paraspinal subcutaneous tissue and a lesion in the LEFT gluteus muscle consistent with soft tissue metastasis. 5. Single nodal metastasis in the gastrohepatic ligament of the upper abdomen . 6. Lytic lesion with in the LEFT sacral ala with hypermetabolic activity. 7. Multiple additional sclerotic lesions without metabolic activity consistent with dormant/treated multiple myeloma. Electronically Signed   By: Suzy Bouchard M.D.   On: 02/06/2021 17:23    ASSESSMENT & PLAN:  85 y.o. male with  1. Lytic bone lesions - w/u consistent with newly diagnosed multiple myeloma Labs upon initial presentation from 04/14/18; hypercalcemic with Calcium at 13.8, renal function up form baseline with Creatinine at 1.48, HGB at 11.3. PSA was normal.   03/31/18 CT C/A/P revealed 1. Lytic lesion throughout all visualized vertebra from the lower cervical spine through the sacrum. Epidural extension of tumor T2, T3 and L2 level. Right L3-4 foraminal extension tumor. 2. Lytic lesions involving majority of ribs. Multiple pathologic rib fractures with bony expansion/sub pleural extension of tumor at several levels. 3. Lytic lesions of the scapula, hips bilaterally and pelvis bilaterally. With further progression of tumor, patient may be at risk for pathologic fractures. Pathologic fracture right clavicle incompletely assessed. 4. Circumferential narrowing sigmoid colon. Question possibility of underlying mass (series 2, image  97). Alternatively this could represent muscular hypertrophy/peristalsis. 5. Gastric antrum circumferential narrowing. Cannot exclude mass although this may be related to peristalsis. 6. No primary lung malignancy noted. 7. Matted aortic pulmonary window lymph nodes. 8. Circumferential urinary bladder wall thickening greater anterior dome region. Right lateral bladder diverticulum. 9. 5 mm low-density lesion pancreatic  body (series 5, image 10) unchanged. This is felt unlikely to be a primary malignancy. 10. Right parapelvic 2.2 cm cyst without significant change. Scattered low-density renal lesions bilaterally too small to adequately characterize. Some were present previously. Statistically these are likely cysts. 11. Stable adrenal gland hyperplasia. 12. Gynecomastia. 13. Prostate gland causes slight impression upon the bladder base. 14.  Aortic Atherosclerosis. 15. Sequential pacemaker in place. 16. Gallstones.   04/23/18 PET/CT revealed Innumerable hypermetabolic lytic lesions throughout the skeleton especially involving the spine, ribs, bony pelvis along with some involvement of both proximal femurs. The appearance is compatible with pathologic diagnosis of plasma cell neoplasm could well reflect multiple myeloma. Resulting pathologic fractures of the right lateral clavicle and of multiple bilateral ribs. Lesions are present along the cortical margins of the spinal canal. 2. Both the area and the stomach in the area in the sigmoid colon drawn attention to on prior CT scan appear benign normal today. 3. The confluence of AP window lymph nodes measures about 1.2 cm in short axis with maximum SUV 3.1, which is mildly above the blood pool merit surveillance. 4. Other imaging findings of potential clinical significance: Aortic Atherosclerosis. Coronary atherosclerosis. Pacemaker noted. Borderline prostatomegaly. Cholelithiasis.  04/21/18 BM Bx revealed Normocellular bone marrow with Plasma Cell neoplasm. Scattered medium sized clusters of kappa-restricted plasma cells (14% aspirate, 10% CD138 immunohistochemistry.   I do suspect that the burden of disease is underestimated in the bone marrow sample given PET/CT findings of innumerable lytic lesions and an M spike of 3.4g   06/08/18 DG Hips bilat which revealed Innumerable lucencies are noted throughout the pelvis and proximal femurs, similar findings noted on prior PET-CT of  04/23/2018. Findings are consistent patient's known multiple myeloma. Femoral necks are intact. 2.  Aortoiliac atherosclerotic vascular disease.  2. Pathologic right clavicle fracture due to myeloma- resolved 3. Hypercalcemia- resolved.   PLAN:    -Discussed pt labwork today, 02/13/2021 CBC and CMP stable -myeloma panel M spike increased to 0.6+0.4 equal to 1 g/dL -PET CT scan as noted above show overt myeloma progression with new bone lesions. -We will refer the patient to radiation oncology to address painful bone lesions in the chest and spine with palliative radiation. -Discussed and plan to have Revlimid and adjust the daratumumab to every 2 weeks to try to optimize systemic treatment of his myeloma. --Continue OTC Vitamin D. -Hold Xgeva at this time until dental issues are resolved. -Prescription provided for as needed tramadol to help with his bone pains related to myeloma. FOLLOW UP: Please schedule for daratumumab every 2 weeks for 6 doses with port flush and labs Xgeva still on hold due to dental procedure Starting patient on oral Revlimid 10 mg 3 weeks on 1 week off Referral to radiation oncology for consideration of palliative radiation to symptomatic myeloma lesions MD visit in 1 month   . The total time spent in the appointment was 40 minutes and more than 50% was on counseling and direct patient cares, ordering and management of new medications, referral and coordination with radiation oncology and discussion of new imaging studies and labs.     All of the patient's questions were answered  with apparent satisfaction. The patient knows to call the clinic with any problems, questions or concerns.    Sullivan Lone MD [place] AAHIVMS [facility] Hematology/Oncology Physician [facility]  (Office):       [phone] (Work cell):  [phone] (Fax):           [phone]

## 2021-02-14 ENCOUNTER — Telehealth: Payer: Self-pay | Admitting: Hematology

## 2021-02-14 NOTE — Telephone Encounter (Signed)
Scheduled follow-up appointments per 10/4 los. Patient's daughter is aware. Mailed calendar.

## 2021-02-16 LAB — MULTIPLE MYELOMA PANEL, SERUM
Albumin SerPl Elph-Mcnc: 3.5 g/dL (ref 2.9–4.4)
Albumin/Glob SerPl: 1 (ref 0.7–1.7)
Alpha 1: 0.2 g/dL (ref 0.0–0.4)
Alpha2 Glob SerPl Elph-Mcnc: 0.9 g/dL (ref 0.4–1.0)
B-Globulin SerPl Elph-Mcnc: 1.6 g/dL — ABNORMAL HIGH (ref 0.7–1.3)
Gamma Glob SerPl Elph-Mcnc: 0.8 g/dL (ref 0.4–1.8)
Globulin, Total: 3.6 g/dL (ref 2.2–3.9)
IgA: 1289 mg/dL — ABNORMAL HIGH (ref 61–437)
IgG (Immunoglobin G), Serum: 586 mg/dL — ABNORMAL LOW (ref 603–1613)
IgM (Immunoglobulin M), Srm: 35 mg/dL (ref 15–143)
M Protein SerPl Elph-Mcnc: 0.6 g/dL — ABNORMAL HIGH
Total Protein ELP: 7.1 g/dL (ref 6.0–8.5)

## 2021-02-19 ENCOUNTER — Encounter: Payer: Self-pay | Admitting: Hematology

## 2021-02-19 MED ORDER — LENALIDOMIDE 10 MG PO CAPS: 10.0000 mg | ORAL_CAPSULE | Freq: Every day | ORAL | 0 refills | Status: DC

## 2021-02-20 ENCOUNTER — Telehealth: Payer: Self-pay | Admitting: Pharmacist

## 2021-02-20 ENCOUNTER — Telehealth: Payer: Self-pay

## 2021-02-20 ENCOUNTER — Encounter: Payer: Self-pay | Admitting: Hematology

## 2021-02-20 ENCOUNTER — Other Ambulatory Visit (HOSPITAL_COMMUNITY): Payer: Self-pay

## 2021-02-20 ENCOUNTER — Telehealth: Payer: Self-pay | Admitting: Radiation Oncology

## 2021-02-20 NOTE — Telephone Encounter (Signed)
Oral Oncology Patient Advocate Encounter  Prior Authorization for Revlimid has been approved.    PA# [id number] Effective dates: 02/20/21 through 02/20/22  Oral Oncology Clinic will continue to follow.   [place] Patient [place] Phone [phone] Fax [phone] 02/20/2021 3:14 PM

## 2021-02-20 NOTE — Telephone Encounter (Signed)
Oral Oncology Patient Advocate Encounter   Received notification from [place] that prior authorization for Revlimid is required.   PA submitted on CoverMyMeds Key [id number] Status is pending   Oral Oncology Clinic will continue to follow.   [place] Patient [place] Phone [phone] Fax [phone] 02/20/2021 9:13 AM

## 2021-02-20 NOTE — Telephone Encounter (Signed)
Oral Oncology Pharmacist Encounter  Received new prescription for Revlimid (lenalidomide) for the treatment of multiple myeloma in conjunction with daratumumab and dexamethasone, planned duration until disease progression or unacceptable drug toxicity.  CBC w/ Diff and CMP from 02/13/21 assessed, no relevant lab abnormalities noted. Prescription dose and frequency assessed.  Current medication list in Epic reviewed, no relevant/significant DDIs with Revlimid identified.  Evaluated chart and no patient barriers to medication adherence noted.   Oral Oncology Clinic will continue to follow for insurance authorization, copayment issues, initial counseling and start date.  Leron Croak, PharmD, BCPS Hematology/Oncology Clinical Pharmacist [facility] [place] [phone] 02/20/2021 8:33 AM

## 2021-02-21 ENCOUNTER — Telehealth: Payer: Self-pay | Admitting: Radiation Oncology

## 2021-02-23 ENCOUNTER — Other Ambulatory Visit: Payer: Self-pay

## 2021-02-23 DIAGNOSIS — C9001 Multiple myeloma in remission: Secondary | ICD-10-CM

## 2021-02-23 DIAGNOSIS — C9000 Multiple myeloma not having achieved remission: Secondary | ICD-10-CM

## 2021-02-23 MED ORDER — LENALIDOMIDE 10 MG PO CAPS: 10.0000 mg | ORAL_CAPSULE | Freq: Every day | ORAL | 0 refills | Status: DC

## 2021-02-26 ENCOUNTER — Other Ambulatory Visit: Payer: Self-pay | Admitting: Pharmacist

## 2021-02-26 ENCOUNTER — Other Ambulatory Visit (HOSPITAL_COMMUNITY): Payer: Self-pay

## 2021-02-26 DIAGNOSIS — C9000 Multiple myeloma not having achieved remission: Secondary | ICD-10-CM

## 2021-02-26 MED ORDER — LENALIDOMIDE 10 MG PO CAPS: 10.0000 mg | ORAL_CAPSULE | Freq: Every day | ORAL | 0 refills | Status: DC

## 2021-02-26 NOTE — Progress Notes (Signed)
Oral Oncology Pharmacist Encounter  Patient's insurance requires Revlimid (lenalidomide) be filled through [facility]. Prescription red[facility] for dispensing.  Leron Croak, PharmD, BCPS Hematology/Oncology Clinical Pharmacist [facility] [phone] 02/26/2021 9:31 AM

## 2021-02-27 ENCOUNTER — Inpatient Hospital Stay: Payer: Medicare Other | Admitting: Dietician

## 2021-02-27 ENCOUNTER — Other Ambulatory Visit: Payer: Self-pay

## 2021-02-27 ENCOUNTER — Inpatient Hospital Stay: Payer: Medicare Other

## 2021-02-27 ENCOUNTER — Other Ambulatory Visit: Payer: Self-pay | Admitting: Hematology

## 2021-02-27 VITALS — BP 140/75 | HR 65 | Temp 98.3°F | Resp 18 | Wt 199.5 lb

## 2021-02-27 DIAGNOSIS — C9002 Multiple myeloma in relapse: Secondary | ICD-10-CM

## 2021-02-27 DIAGNOSIS — C9001 Multiple myeloma in remission: Secondary | ICD-10-CM

## 2021-02-27 DIAGNOSIS — Z7189 Other specified counseling: Secondary | ICD-10-CM

## 2021-02-27 DIAGNOSIS — Z5112 Encounter for antineoplastic immunotherapy: Secondary | ICD-10-CM | POA: Diagnosis not present

## 2021-02-27 LAB — CBC WITH DIFFERENTIAL/PLATELET
Abs Immature Granulocytes: 0.01 10*3/uL (ref 0.00–0.07)
Basophils Absolute: 0.1 10*3/uL (ref 0.0–0.1)
Basophils Relative: 1 %
Eosinophils Absolute: 0.5 10*3/uL (ref 0.0–0.5)
Eosinophils Relative: 8 %
HCT: 35.3 % — ABNORMAL LOW (ref 39.0–52.0)
Hemoglobin: 11.7 g/dL — ABNORMAL LOW (ref 13.0–17.0)
Immature Granulocytes: 0 %
Lymphocytes Relative: 38 %
Lymphs Abs: 2.3 10*3/uL (ref 0.7–4.0)
MCH: 27.4 pg (ref 26.0–34.0)
MCHC: 33.1 g/dL (ref 30.0–36.0)
MCV: 82.7 fL (ref 80.0–100.0)
Monocytes Absolute: 0.7 10*3/uL (ref 0.1–1.0)
Monocytes Relative: 11 %
Neutro Abs: 2.6 10*3/uL (ref 1.7–7.7)
Neutrophils Relative %: 42 %
Platelets: 202 10*3/uL (ref 150–400)
RBC: 4.27 MIL/uL (ref 4.22–5.81)
RDW: 14.1 % (ref 11.5–15.5)
WBC: 6 10*3/uL (ref 4.0–10.5)
nRBC: 0 % (ref 0.0–0.2)

## 2021-02-27 LAB — CMP (CANCER CENTER ONLY)
ALT: 6 U/L (ref 0–44)
AST: 18 U/L (ref 15–41)
Albumin: 3.3 g/dL — ABNORMAL LOW (ref 3.5–5.0)
Alkaline Phosphatase: 85 U/L (ref 38–126)
Anion gap: 12 (ref 5–15)
BUN: 13 mg/dL (ref 8–23)
CO2: 24 mmol/L (ref 22–32)
Calcium: 9.4 mg/dL (ref 8.9–10.3)
Chloride: 98 mmol/L (ref 98–111)
Creatinine: 1.08 mg/dL (ref 0.61–1.24)
GFR, Estimated: >60 mL/min (ref >60)
Glucose, Bld: 117 mg/dL — ABNORMAL HIGH (ref 70–99)
Potassium: 4.2 mmol/L (ref 3.5–5.1)
Sodium: 134 mmol/L — ABNORMAL LOW (ref 135–145)
Total Bilirubin: 0.5 mg/dL (ref 0.3–1.2)
Total Protein: 7.7 g/dL (ref 6.5–8.1)

## 2021-02-27 MED ORDER — SODIUM CHLORIDE 0.9 % IV SOLN
20.0000 mg | Freq: Once | INTRAVENOUS | Status: AC
  Administered 2021-02-27: 20 mg via INTRAVENOUS
  Filled 2021-02-27: qty 20

## 2021-02-27 MED ORDER — FAMOTIDINE 20 MG IN NS 100 ML IVPB
20.0000 mg | Freq: Once | INTRAVENOUS | Status: AC
  Administered 2021-02-27: 20 mg via INTRAVENOUS
  Filled 2021-02-27: qty 100

## 2021-02-27 MED ORDER — SODIUM CHLORIDE 0.9 % IV SOLN
16.0000 mg/kg | Freq: Once | INTRAVENOUS | Status: AC
  Administered 2021-02-27: 1600 mg via INTRAVENOUS
  Filled 2021-02-27: qty 80

## 2021-02-27 MED ORDER — ACETAMINOPHEN 325 MG PO TABS
650.0000 mg | ORAL_TABLET | Freq: Once | ORAL | Status: AC
  Administered 2021-02-27: 650 mg via ORAL
  Filled 2021-02-27: qty 2

## 2021-02-27 MED ORDER — MONTELUKAST SODIUM 10 MG PO TABS
10.0000 mg | ORAL_TABLET | Freq: Once | ORAL | Status: AC
  Administered 2021-02-27: 10 mg via ORAL
  Filled 2021-02-27: qty 1

## 2021-02-27 MED ORDER — DIPHENHYDRAMINE HCL 25 MG PO CAPS
50.0000 mg | ORAL_CAPSULE | Freq: Once | ORAL | Status: AC
  Administered 2021-02-27: 50 mg via ORAL
  Filled 2021-02-27: qty 2

## 2021-02-27 MED ORDER — SODIUM CHLORIDE 0.9 % IV SOLN: Freq: Once | INTRAVENOUS | Status: AC

## 2021-02-27 NOTE — Progress Notes (Signed)
Nutrition Assessment   Reason for Assessment: MST   ASSESSMENT: 85 year old male with multiple myeloma. S/p PET on 9/27 with evidence of progression. He is currently receiving Revlimid/daratumumab. Patient is followed by Dr. Irene Limbo  Past medical history includes CHF, CIDP, HTN, iron deficiency anemia, COVID infection, s/p pacemaker.    Met with patient during infusion. He reports doing well. Patient reports loss of appetite after Covid infection ~2 months ago. Patient says this is improving. He is a resident of [facility], reports eating breakfast and dinner meals. This morning ~8:30 he had bowl of cheerios with whole milk. He is offered grits or oatmeal but did not have either today. He usually does not eat again until dinner ~5 PM. Patient enjoys their soups, reports the garden vegetable is seasoned well. He typically has soup and meat with vegetable (baked chicken, fish, brussels sprouts, beans, peas) Patient keeps a few snacks in his room, reports liking peanuts and oatmeal chocolate chip pies. His daughter has recently purchased Boost and Ensure for him to try. He likes these, but is not drinking them everyday. Patient agreeable to drinking vanilla Ensure Enlive during treatment today.    Nutrition Focused Physical Exam: deferred   Medications: Lasix 40 mg, zofran, miralax, vit D, tramadol   Labs: Glucose 117, Na 134   Anthropometrics: Weights have decreased 8 lbs (3.9%) from 207 lb 11.2 oz on 8/30. Patient weight have decreased ~19 lbs (8.7%) from usual weight over the past 7 months. Trends are insignificant for time frame, however concerning given advanced age.  Height: 5'11" Weight: 199.5 lb  UBW: 218 lb (08/02/20) BMI: 27.82    NUTRITION DIAGNOSIS: Unintentional weight loss related to acute illness and cancer as evidenced by reported decreased appetite with recent Covid infection and 3.9% (8 lb) weight loss in 6 weeks. This is insignificant for time frame.      INTERVENTION:  Educated on importance of adequate calories and protein to maintain weights, strength, nutrition - handout provided Encouraged high calorie, high protein snacks in between meals - handout with ideas provided Discussed strategies for added calories and protein to foods  Recommended drinking Ensure Plus/equivalent daily - coupons provided Patient provided vanilla Ensure Enlive to drink during infusion today Contact information provided     MONITORING, EVALUATION, GOAL: Patient will tolerate increased calories and protein to promote stable weight/gain   Next Visit: Tuesday November 15 during infusion

## 2021-02-27 NOTE — Patient Instructions (Signed)
[facility]COL[facility]  Discharge Instructions: °Thank you for choosing [facility] to provide your oncology and hematology care.  ° °If you have a lab appointment with the Cancer Center, please go directly to the Cancer Center and check in at the registration area. °  °Wear comfortable clothing and clothing appropriate for easy access to any Portacath or PICC line.  ° °We strive to give you quality time with your provider. You may need to reschedule your appointment if you arrive late (15 or more minutes).  Arriving late affects you and other patients whose appointments are after yours.  Also, if you miss three or more appointments without notifying the office, you may be dismissed from the clinic at the provider’s discretion.    °  °For prescription refill requests, have your pharmacy contact our office and allow 72 hours for refills to be completed.   ° °Today you received the following chemotherapy and/or immunotherapy agents: Darzalex  °  °To help prevent nausea and vomiting after your treatment, we encourage you to take your nausea medication as directed. ° °BELOW ARE SYMPTOMS THAT SHOULD BE REPORTED IMMEDIATELY: °*FEVER GREATER THAN 100.4 F (38 °C) OR HIGHER °*CHILLS OR SWEATING °*NAUSEA AND VOMITING THAT IS NOT CONTROLLED WITH YOUR NAUSEA MEDICATION °*UNUSUAL SHORTNESS OF BREATH °*UNUSUAL BRUISING OR BLEEDING °*URINARY PROBLEMS (pain or burning when urinating, or frequent urination) °*BOWEL PROBLEMS (unusual diarrhea, constipation, pain near the anus) °TENDERNESS IN MOUTH AND THROAT WITH OR WITHOUT PRESENCE OF ULCERS (sore throat, sores in mouth, or a toothache) °UNUSUAL RASH, SWELLING OR PAIN  °UNUSUAL VAGINAL DISCHARGE OR ITCHING  ° °Items with * indicate a potential emergency and should be followed up as soon as possible or go to the Emergency Department if any problems should occur. ° °Please show the CHEMOTHERAPY ALERT CARD or IMMUNOTHERAPY ALERT CARD at check-in to the  Emergency Department and triage nurse. ° °Should you have questions after your visit or need to cancel or reschedule your appointment, please contact [facility]  Dept: [phone]  and follow the prompts.  Office hours are 8:00 a.m. to 4:30 p.m. Monday - Friday. Please note that voicemails left after 4:00 p.m. may not be returned until the following business day.  We are closed weekends and major holidays. You have access to a nurse at all times for urgent questions. Please call the main number to the clinic Dept: [phone] and follow the prompts. ° ° °For any non-urgent questions, you may also contact your provider using MyChart. We now offer e-Visits for anyone 18 and older to request care online for non-urgent symptoms. For details visit [email or web address]. °  °Also download the MyChart app! Go to the app store, search "MyChart", open the app, select [facility], and log in with your MyChart username and password. ° °Due to Covid, a mask is required upon entering the hospital/clinic. If you do not have a mask, one will be given to you upon arrival. For doctor visits, patients may have 1 support person aged 18 or older with them. For treatment visits, patients cannot have anyone with them due to current Covid guidelines and our immunocompromised population.  ° °

## 2021-02-27 NOTE — Progress Notes (Signed)
Keep Daratumumab dose at 1600 mg per Dr. Irene Limbo.  Kennith Center, Pharm.D., CPP 02/27/2021@1 :00 PM

## 2021-02-28 ENCOUNTER — Encounter: Payer: Self-pay | Admitting: Radiation Oncology

## 2021-02-28 ENCOUNTER — Ambulatory Visit
Admission: RE | Admit: 2021-02-28 | Discharge: 2021-02-28 | Disposition: A | Discharge status: Home / Self Care | Payer: Medicare Other | Source: Ambulatory Visit | Attending: Radiation Oncology | Admitting: Radiation Oncology

## 2021-02-28 ENCOUNTER — Telehealth: Payer: Self-pay

## 2021-02-28 ENCOUNTER — Other Ambulatory Visit (HOSPITAL_COMMUNITY): Payer: Self-pay

## 2021-02-28 VITALS — BP 181/86 | HR 73 | Temp 97.2°F | Resp 20 | Ht 71.0 in | Wt 203.6 lb

## 2021-02-28 DIAGNOSIS — I110 Hypertensive heart disease with heart failure: Secondary | ICD-10-CM | POA: Diagnosis not present

## 2021-02-28 DIAGNOSIS — J45909 Unspecified asthma, uncomplicated: Secondary | ICD-10-CM | POA: Insufficient documentation

## 2021-02-28 DIAGNOSIS — Z803 Family history of malignant neoplasm of breast: Secondary | ICD-10-CM | POA: Diagnosis not present

## 2021-02-28 DIAGNOSIS — C9000 Multiple myeloma not having achieved remission: Secondary | ICD-10-CM | POA: Diagnosis not present

## 2021-02-28 DIAGNOSIS — I5032 Chronic diastolic (congestive) heart failure: Secondary | ICD-10-CM | POA: Insufficient documentation

## 2021-02-28 DIAGNOSIS — Z8041 Family history of malignant neoplasm of ovary: Secondary | ICD-10-CM | POA: Diagnosis not present

## 2021-02-28 DIAGNOSIS — F17210 Nicotine dependence, cigarettes, uncomplicated: Secondary | ICD-10-CM | POA: Insufficient documentation

## 2021-02-28 DIAGNOSIS — M25511 Pain in right shoulder: Secondary | ICD-10-CM | POA: Diagnosis not present

## 2021-02-28 DIAGNOSIS — R918 Other nonspecific abnormal finding of lung field: Secondary | ICD-10-CM | POA: Insufficient documentation

## 2021-02-28 DIAGNOSIS — C7951 Secondary malignant neoplasm of bone: Secondary | ICD-10-CM

## 2021-02-28 DIAGNOSIS — Z7961 Long term (current) use of immunomodulator: Secondary | ICD-10-CM | POA: Insufficient documentation

## 2021-02-28 DIAGNOSIS — Z7982 Long term (current) use of aspirin: Secondary | ICD-10-CM | POA: Insufficient documentation

## 2021-02-28 DIAGNOSIS — G6181 Chronic inflammatory demyelinating polyneuritis: Secondary | ICD-10-CM | POA: Insufficient documentation

## 2021-02-28 DIAGNOSIS — Z87891 Personal history of nicotine dependence: Secondary | ICD-10-CM | POA: Diagnosis not present

## 2021-02-28 NOTE — Telephone Encounter (Signed)
Oral Chemotherapy Pharmacist Encounter   Spoke with patient's daughter today to follow up regarding patient's oral chemotherapy medication: Revlimid (lenalidomide)  Medication is required to be dispensed from [facility]. Oral chemotherapy clinic obtained grant for patient bringing patient's $900 copay for first fill to $0. Patient's daughter provided with phone number [phone]) to call and set up shipment of medication to [facility].   Called and spoke with Florene Glen at [facility] in [place]. She is aware medication will be shipped to their facility. The facility will need a faxed prescription of patient's Revlimid before being able to administer at the facility.    Patient's daughter knows to call the office with questions or concerns. Will follow up with daughter regarding specific start date and initial counseling.   Leron Croak, PharmD, BCPS Hematology/Oncology Clinical Pharmacist [facility] [phone] 02/28/2021 2:51 PM

## 2021-02-28 NOTE — Progress Notes (Signed)
Radiation Oncology         [phone] ________________________________  Name: Jonathon [name]        MRN: [id number]  Date of Service: 02/28/2021 DOB: 11/26/1935  YT:[id number], Langley Adie, MD  Brunetta Genera, MD     REFERRING PHYSICIAN: Brunetta Genera, MD   DIAGNOSIS: The encounter diagnosis was Multiple myeloma, remission status unspecified ([place]).   HISTORY OF PRESENT ILLNESS: Jonathon [name] is a 85 y.o. male seen at the request of Dr. Irene Limbo for a diagnosis of multiple myeloma who presented with a right distal clavicle fracture in November 2019. He underwent work up, and was found to have multiple lytic lesions throughout the skeleton. He had blood work and bone marrow biopsy and was ultimately diagnosed with multiple myeloma in December 2019. He was started on Darzalex. Recent PET scan on 02/06/21 showed active disease in the left chest wall with hypermetabolism, the right scapula, paraspinal subcutaneous tissue and left gluteus, a nodal metastasis in the gastrohepatic ligament, and the left sacral ala. He recently started on Revlimid and is seen to discuss palliative radiotherapy to his painful disease in the right shoulder region.   PREVIOUS RADIATION THERAPY: No   PAST MEDICAL HISTORY:  Past Medical History:  Diagnosis Date   Asthma    BENIGN PROSTATIC HYPERTROPHY, HX OF 10/25/2008   Bone metastases ([place]) 04/14/2018   BRADYCARDIA 2005   Chronic diastolic congestive heart failure ([place]) 06/05/2016   CIDP (chronic inflammatory demyelinating polyneuropathy) ([place])    HYPERTENSION 11/21/2006   Iron deficiency anemia, unspecified 04/19/2013   Multiple myeloma ([place]) 04/29/2018   NEPHROLITHIASIS, HX OF 11/21/2006 and 10/15/2008   PACEMAKER, PERMANENT 2005   Gen change 2014 Medtronic Adaptic L dual-chamber pacemaker, serial #[id number]    SVT (supraventricular tachycardia) ([place])    TOBACCO ABUSE 10/25/2008   Quit 2012       PAST SURGICAL HISTORY: Past Surgical History:  Procedure  Laterality Date   COLONOSCOPY  2004,2009   PACEMAKER INSERTION  05/14/2003   PERMANENT PACEMAKER GENERATOR CHANGE N/A 06/19/2012   Procedure: PERMANENT PACEMAKER GENERATOR CHANGE;  Surgeon: [name], MD; Medtronic Adaptic L dual-chamber pacemaker, serial #[id number]     SKIN GRAFT Right 05/13/1960   wrist   TOOTH EXTRACTION  11/10/2020   3 teeth removed     FAMILY HISTORY:  Family History  Problem Relation Age of Onset   Heart attack Father        Died, 66   Kidney disease Father    Parkinson's disease Mother        Died, 6   Breast cancer Sister        Living, 58   Diabetes type II Brother        Living, 73   Breast cancer Sister        Living, 22   Diabetes Daughter        Living, 35   Diabetes Son        Living, 67   Ovarian cancer Daughter    Colon cancer Neg Hx    Rectal cancer Neg Hx    Stomach cancer Neg Hx      SOCIAL HISTORY:  reports that he quit smoking about 10 years ago. His smoking use included cigarettes. He started smoking about 55 years ago. He has a 11.50 pack-year smoking history. He has never used smokeless tobacco. He reports current alcohol use. He reports that he does not use drugs.  The patient is divorced and  lives in [place] however more recently he has been residing in [place] living in [place].  He had been a very active man physically up until his neurologic diagnosis.  He still enjoys exercising is much as he is able, and even uses light weights for weight training.  He is retired from working in a Loss adjuster, chartered and had environmental exposures to multiple chemicals over the years.  His daughter [name] is a Mudlogger at [employer].  They enjoy spending time together, he enjoys reading and staying mentally active as well.   ALLERGIES: Lexapro [escitalopram oxalate]   MEDICATIONS:  Current Outpatient Medications  Medication Sig Dispense Refill   acyclovir (ZOVIRAX) 400 MG tablet Take 1 tablet (400 mg  total) by mouth 2 (two) times daily. 60 tablet 11   amoxicillin (AMOXIL) 250 MG capsule Take 250 mg by mouth 3 (three) times daily.     aspirin EC 81 MG tablet Take 81 mg by mouth daily.     diltiazem (TIAZAC) 240 MG 24 hr capsule Take 1 capsule by mouth daily.     flecainide (TAMBOCOR) 50 MG tablet Take 75 mg by mouth daily as needed.     furosemide (LASIX) 40 MG tablet Take 40 mg by mouth daily.     irbesartan (AVAPRO) 300 MG tablet Take 300 mg by mouth daily.     lenalidomide (REVLIMID) 10 MG capsule Take 1 capsule (10 mg total) by mouth daily. Take for 21 days on, 7 days off. Repeat every 28 days. 21 capsule 0   memantine (NAMENDA) 5 MG tablet Take 5 mg by mouth 2 (two) times daily.     metoprolol tartrate (LOPRESSOR) 50 MG tablet Take 1 tablet (50 mg total) by mouth 2 (two) times daily. 60 tablet 0   ondansetron (ZOFRAN) 8 MG tablet Take 1 tablet (8 mg total) by mouth 2 (two) times daily as needed (Nausea or vomiting). 30 tablet 1   polyethylene glycol (MIRALAX / GLYCOLAX) packet Take 17 g by mouth daily. 14 each 0   prochlorperazine (COMPAZINE) 10 MG tablet Take 1 tablet (10 mg total) by mouth every 6 (six) hours as needed (Nausea or vomiting). 30 tablet 1   Saccharomyces boulardii (FLORASTOR PO) Take 250 mg by mouth 2 (two) times daily.     tamsulosin (FLOMAX) 0.4 MG CAPS capsule Take 2 capsules (0.8 mg total) by mouth daily. 90 capsule 0   traMADol (ULTRAM) 50 MG tablet Take 1 tablet (50 mg total) by mouth every 6 (six) hours as needed. 30 tablet 0   No current facility-administered medications for this encounter.   Facility-Administered Medications Ordered in Other Encounters  Medication Dose Route Frequency Provider Last Rate Last Admin   [MAR Hold] sodium chloride flush (NS) 0.9 % injection 10 mL  10 mL Intracatheter PRN Brunetta Genera, MD         REVIEW OF SYSTEMS: On review of systems, the patient reports that he is doing overall pretty well, he states that his most  uncomfortable part of the body is currently his right shoulder, he states that even pulling or trying to lift his arm can recreate some of this pain.  He noted this especially when trying to focus on more upper body exercise.  He has also noticed a palpable area on the back of his trunk at the level of the lumbar spine, this is not quite as painful but was noticeable when he was showering and dressing.  He has also had  low back discomfort especially when standing for long periods of time which is new.  He denies any radiculopathy or loss of control or bowel or bladder function.  Regarding his pain he takes tramadol as needed which seems to help minimize some of his symptoms.     PHYSICAL EXAM:  Wt Readings from Last 3 Encounters:  02/28/21 203 lb 9.6 oz (92.4 kg)  02/27/21 199 lb 8 oz (90.5 kg)  02/13/21 200 lb 4.8 oz (90.9 kg)   Temp Readings from Last 3 Encounters:  02/28/21 (!) 97.2 F (36.2 C)  02/27/21 98.3 F (36.8 C) (Oral)  02/13/21 97.8 F (36.6 C) (Oral)   BP Readings from Last 3 Encounters:  02/28/21 (!) 181/86  02/27/21 140/75  02/13/21 (!) 148/79   Pulse Readings from Last 3 Encounters:  02/28/21 73  02/27/21 65  02/13/21 68   Pain Assessment Pain Score: 7  Pain Loc: Shoulder (Right Shoulder)/10  In general this is a well appearing African-American male in no acute distress.  He's alert and oriented x4 and appropriate throughout the examination. Cardiopulmonary assessment is negative for acute distress and he exhibits normal effort.  Notably, he has approximately 4 cm of palpable fullness in the left axillary tail and extending into the low axilla of the chest wall, this is soft and somewhat mobile.  Pain is not elicited with palpation.  He specifically denies any pain in this region but he has noticed a palpable lesion that seen about the level of L2-L3 3, the lesion is subcutaneous as well without evidence of erosion.  The skin is soft and pliable without any visible  telangiectasias.  The lesion is approximately 3 cm in greatest dimension.  He has limited range of motion with his right upper extremity for  abduction and external rotation.    ECOG = 1  0 - Asymptomatic (Fully active, able to carry on all predisease activities without restriction)  1 - Symptomatic but completely ambulatory (Restricted in physically strenuous activity but ambulatory and able to carry out work of a light or sedentary nature. For example, light housework, office work)  2 - Symptomatic, <50% in bed during the day (Ambulatory and capable of all self care but unable to carry out any work activities. Up and about more than 50% of waking hours)  3 - Symptomatic, >50% in bed, but not bedbound (Capable of only limited self-care, confined to bed or chair 50% or more of waking hours)  4 - Bedbound (Completely disabled. Cannot carry on any self-care. Totally confined to bed or chair)  5 - Death   Eustace Pen MM, Creech RH, Tormey DC, et al. [phone]). "Toxicity and response criteria of the [facility]". [place] Oncol. 5 (6): 649-55    LABORATORY DATA:  Lab Results  Component Value Date   WBC 6.0 02/27/2021   HGB 11.7 (L) 02/27/2021   HCT 35.3 (L) 02/27/2021   MCV 82.7 02/27/2021   PLT 202 02/27/2021   Lab Results  Component Value Date   NA 134 (L) 02/27/2021   K 4.2 02/27/2021   CL 98 02/27/2021   CO2 24 02/27/2021   Lab Results  Component Value Date   ALT 6 02/27/2021   AST 18 02/27/2021   ALKPHOS 85 02/27/2021   BILITOT 0.5 02/27/2021      RADIOGRAPHY: NM PET Image Restage (PS) Whole Body  Result Date: 02/06/2021 CLINICAL DATA:  Subsequent treatment strategy for multiple myeloma. EXAM: NUCLEAR MEDICINE PET WHOLE BODY  TECHNIQUE: 10.2 mCi F-18 FDG was injected intravenously. Full-ring PET imaging was performed from the head to foot after the radiotracer. CT data was obtained and used for attenuation correction and anatomic localization. Fasting  blood glucose: 119 mg/dl COMPARISON:  PET-CT scan 04/23/2018 FINDINGS: Mediastinal blood pool activity: SUV max 2.5 HEAD/NECK: No hypermetabolic activity in the scalp. No hypermetabolic cervical lymph nodes. Incidental CT findings: none CHEST: Incidental CT findings: Large LEFT lateral chest wall mass measuring 7.3 cm (image 91/CT series 4) has intense metabolic activity SUV max equal 12.6. This mass extends into the lung parenchyma in through the chest wall into the LEFT axilla. The mass originates from the LEFT lateral fifth rib. No additional pulmonary nodules.  No mediastinal lymphadenopathy. ABDOMEN/PELVIS: Single enlarged node in the gastrohepatic ligament measuring 19 mm (image 137) with moderate metabolic activity (SUV max equal 5). No abnormal metabolic activity liver. Hypermetabolic nodule in the subcutaneous tissue of the back just RIGHT of the L4 spinous process measuring 26 mm SUV max equal 8.7. Image 161. Small nodule within the LEFT gluteus muscle with SUV max equal 6.0 on image 193. Probable small lesion noted in the muscle. There is some misregistration. Incidental CT findings: none SKELETON: Expansile hypermetabolic mass expanding the body of the RIGHT scapula and extending into the posterior musculature measuring 7 cm with SUV max equal 21.9 (image 72) Lytic lesion in the LEFT sacral ala with SUV max equal 11.4. soft tissue extension into the musculature noted on image 173/series 4 measuring 3 cm. Incidental CT findings: There multiple additional sclerotic lesions at site of prior hypermetabolic melanoma however these are now non metabolically active. EXTREMITIES: No abnormal hypermetabolic activity in the lower extremities. Incidental CT findings: none IMPRESSION: 1. 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 2. Large expansile soft tissue mass extending through the LEFT chest wall with intense metabolic activity. 3. Large expansile mass originating in the body of  the RIGHT scapula and extending into the posterior musculature with intense metabolic activity. 4. Soft tissue nodule in the midline paraspinal subcutaneous tissue and a lesion in the LEFT gluteus muscle consistent with soft tissue metastasis. 5. Single nodal metastasis in the gastrohepatic ligament of the upper abdomen . 6. Lytic lesion with in the LEFT sacral ala with hypermetabolic activity. 7. Multiple additional sclerotic lesions without metabolic activity consistent with dormant/treated multiple myeloma. Electronically Signed   By: Suzy Bouchard M.D.   On: 02/06/2021 17:23       IMPRESSION/PLAN: 1. Multiple myeloma.  Dr. Lisbeth Renshaw discusses the findings from the patient's imaging and it appears that he has had relapse.  The PET scan findings show hypermetabolic change in the right scapula with CT correlate, the same is noted along the left chest wall at the level of the axillary tail and axilla.  He also has disease in the sacroiliac region that appears to be causing some symptoms.  We also discussed the finding in the posterior trunk.  Dr. Lisbeth Renshaw discusses that the locations of the chest wall lesion at the level of the axilla, the right scapula, and the S eye joint appeared to be a good target for radiotherapy in the palliative setting.  Since the patient is not having pain in the lesion along the posterior trunk at the level of the lumbar spine, we will watch this, we could consider additional therapy however this would also be an area that clinically can be followed to determine response as well with the plans to Revlimid.  The  patient does have questions about the timing of starting Revlimid which I messaged Dr. [name] nurse about.  We discussed the rationale and logistics of radiotherapy, we discussed side effects of treatment over the course of 10 fractions including short and long-term effects.  The patient is interested in proceeding.  He will be contacted by our simulation staff to coordinate  planning and subsequent scheduling of therapy. Written consent is obtained and placed in the chart, a copy was provided to the patient. 2. In situ ICD.  We will reach out to Dr. Lovena Le, the patient's cardiologist for permission to pursue treatment, 2 of the areas of therapy are a good distance away from the site however treatment for the left axillary tail was approximately 5 cm from his device.  In a visit lasting 60 minutes, greater than 50% of the time was spent face to face discussing the patient's condition, in preparation for the discussion, and coordinating the patient's care.   The above documentation reflects my direct findings during this shared patient visit. Please see the separate note by Dr. Lisbeth Renshaw on this date for the remainder of the patient's plan of care.    Carola Rhine, [facility]   **Disclaimer: This note was dictated with voice recognition software. Similar sounding words can inadvertently be transcribed and this note may contain transcription errors which may not have been corrected upon publication of note.**

## 2021-02-28 NOTE — Progress Notes (Signed)
Histology and Location of Primary Cancer: Multiple Myeloma  Sites of Visceral and Bony Metastatic Disease: Right Scapula, left sacral ala, soft tissue mass in left chest wall, nodal mets in the gastrohepatic ligament of the upper abdomen.  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  Large expansile soft tissue mass extending through the LEFT chest wall with intense metabolic activity.  Large expansile mass originating in the body of the RIGHT scapula and extending into the posterior musculature with intense metabolic activity. Soft tissue nodule in the midline paraspinal subcutaneous tissue and a lesion in the LEFT gluteus muscle consistent with soft tissue metastasis. Single nodal metastasis in the gastrohepatic ligament of the upper abdomen.  Lytic lesion with in the LEFT sacral ala with hypermetabolic activity.  Multiple additional sclerotic lesions without metabolic activity consistent with dormant/treated multiple myeloma.  Past/Anticipated chemotherapy by medical oncology, if any:  Dr. Irene Limbo 02/13/2021 -myeloma panel M spike increased to 0.6+0.4 equal to 1 g/dL -PET CT scan as noted above show overt myeloma progression with new bone lesions. -We will refer the patient to radiation oncology to address painful bone lesions in the chest and spine with palliative radiation. -Discussed and plan to have Revlimid and adjust the daratumumab to every 2 weeks to try to optimize systemic treatment of his myeloma. --Continue OTC Vitamin D. -Hold Xgeva at this time until dental issues are resolved. -Prescription provided for as needed tramadol to help with his bone pains related to myeloma.   Pain on a scale of 0-10 is:  Right Shoulder pain 7/10 for the last couple of months.  He notes a knot on lower back, is not painful, noticed this within the last couple of weeks.  Ambulatory status? Walker? Wheelchair?: Uses a cane  SAFETY ISSUES: Prior radiation?  No Pacemaker/ICD? Pacer, managed by Dr. Lovena Le Possible current pregnancy? N/a Is the patient on methotrexate? No  Current Complaints / other details:   Lives at [facility]

## 2021-02-28 NOTE — Telephone Encounter (Signed)
Spoke with [facility] staff member regarding setting up appointment for patient to see GT or Any Tillery on 10/26 to have device checked in office, patient has not had device checked since 2017. Patient is due to start radiation and will need device checked prior to initiation of treatment. Staff member stated that [place] who organizes transportation would give a call back, direct number to device clinic given.

## 2021-02-28 NOTE — Telephone Encounter (Signed)
Oral Oncology Patient Advocate Encounter  Was successful in securing patient a $12000 grant from [facility] to provide copayment coverage for Revlimid.  This will keep the out of pocket expense at $0.     [employer] ID: [id number]  I have spoken with the patient.   The billing information is as follows and has been shared with [facility].    RxBin: [id number] PCN: [id number] Member ID: [id number] Group ID: [id number] Dates of Eligibility: 01/29/21 through 01/28/22  Fund:  [place] Patient [place] Phone [phone] Fax [phone] 02/28/2021 4:06 PM

## 2021-03-01 ENCOUNTER — Encounter: Payer: Self-pay | Admitting: General Practice

## 2021-03-01 NOTE — Progress Notes (Signed)
[place] Psychosocial Distress Screening Clinical Social Work  Clinical Social Work was referred by distress screening protocol.  The patient scored a 8 on the Psychosocial Distress Thermometer which indicates moderate distress. Clinical Social Worker contacted patient by phone to assess for distress and other psychosocial needs. Unable to reach patient, spoke w daughter.  "He is is good spirits now that they know the reason for his pain and can address it."  Feels optimistic and feels better.  Lives in assisted living, [facility].  Daughter can transport as needed.  Has Medicare Parts A and B, Supplement and additional insurance.    ONCBCN DISTRESS SCREENING 02/28/2021  Screening Type Initial Screening  Distress experienced in past week (1-10) 8  Emotional problem type Adjusting to illness;Boredom  Physical Problem type Pain;Sleep/insomnia;Getting around;Breathing;Mouth sores/swallowing;Loss of appetitie;Tingling hands/feet    Clinical Social Worker follow up needed: No.  If yes, follow up plan:  Beverely Pace, [place], LCSW Clinical Social Worker Phone:  [phone]

## 2021-03-01 NOTE — Telephone Encounter (Signed)
Appointment scheduled for 10/26 at 8:40AM

## 2021-03-01 NOTE — Telephone Encounter (Signed)
The facility called back and I scheduled him an appointment for 03/07/2021 at 8:40 am. She agreed to have him her at this time.

## 2021-03-02 LAB — MULTIPLE MYELOMA PANEL, SERUM
Albumin SerPl Elph-Mcnc: 3.3 g/dL (ref 2.9–4.4)
Albumin/Glob SerPl: 0.9 (ref 0.7–1.7)
Alpha 1: 0.2 g/dL (ref 0.0–0.4)
Alpha2 Glob SerPl Elph-Mcnc: 0.9 g/dL (ref 0.4–1.0)
B-Globulin SerPl Elph-Mcnc: 1.6 g/dL — ABNORMAL HIGH (ref 0.7–1.3)
Gamma Glob SerPl Elph-Mcnc: 0.9 g/dL (ref 0.4–1.8)
Globulin, Total: 3.7 g/dL (ref 2.2–3.9)
IgA: 1439 mg/dL — ABNORMAL HIGH (ref 61–437)
IgG (Immunoglobin G), Serum: 533 mg/dL — ABNORMAL LOW (ref 603–1613)
IgM (Immunoglobulin M), Srm: 31 mg/dL (ref 15–143)
M Protein SerPl Elph-Mcnc: 0.7 g/dL — ABNORMAL HIGH
Total Protein ELP: 7 g/dL (ref 6.0–8.5)

## 2021-03-02 NOTE — Telephone Encounter (Signed)
Oral Chemotherapy Pharmacist Encounter  I spoke with patient's daughter for overview of: Revlimid for the treatment of multiple myeloma in conjunction with daratumumab and dexamethasone, planned duration until disease progression or unacceptable drug toxicity.  Counseled on administration, dosing, side effects, monitoring, drug-food interactions, safe handling, storage, and disposal. This will be administered by patient's nursing facility.   Patient will take Revlimid 30m capsules, 1 capsule by mouth once daily, without regard to food, with a full glass of water.  Revlimid will be given 21 days on, 7 days off, repeat every 28 days.  Revlimid start date: 03/02/21  Adverse effects of Revlimid include but are not limited to: nausea, constipation, diarrhea, abdominal pain, rash, fatigue, drug fever, and decreased blood counts.    Reviewed importance of keeping a medication schedule and plan for any missed doses. No barriers to medication adherence identified since this will be administered by patient's facility.   Medication reconciliation performed and medication/allergy list updated.  Patient continues to take acyclovir and daily aspirin 874mfor VTE prophylaxis.  Insurance authorization for Revlimid has been obtained. [facility] as it is a limited distribution medication. Medication is being shipped directly to patient's facility (first shipment delivered 03/01/21).  All questions answered.  Ms. NeLucia Gaskinsoiced understanding and appreciation.   Medication education handout placed in mail for patient's family. Family knows to call the office with questions or concerns. Oral Chemotherapy Clinic phone number provided.   ReLeron CroakPharmD, BCPS Hematology/Oncology Clinical Pharmacist [facility] [phone]/21/2022 11:00 AM

## 2021-03-06 ENCOUNTER — Other Ambulatory Visit: Payer: Medicare Other

## 2021-03-06 ENCOUNTER — Ambulatory Visit: Payer: Medicare Other

## 2021-03-07 ENCOUNTER — Encounter: Payer: Medicare Other | Admitting: Student

## 2021-03-10 NOTE — Progress Notes (Signed)
Cardiology Office Note Date:  03/10/2021  Patient ID:  Jonathon [name], Jonathon [name] 04-Sep-1935, MRN [id number] PCP:  [name], MD  Electrophysiologist: Dr. Lovena Le Oncologist; Dr. Irene Limbo    Chief Complaint: over due device visit  History of Present Illness: [name] STATES is a 85 y.o. male with history of AFib, AT, sinus node dysfunction w/PPM, CIPD, multiple myeloma (bone mets), HTN.  He come sin today to be seen for Dr. Lovena Le, last seen by him May 2021, despite serious medical problems, a very positive attitude, and cardiac-wise doing OK. Maintained on low dose flecainide, BP was elevated though labile and no changes were made. Notes report AFib, Dr. Lovena Le discussed AT was controlled, e was not on a/c  He is now planned for palliative XRT for painful lytic bone lesions and comes in for an over due device check   TODAY He comes today accompanied by his daughter He is having some orthopedic pain, is pending his trial run for XRT to get started. Cardiac-wise, he says he [place] doing quite well, has no cardiac awareness of any kind. No CP, palpitations Denies SOB No near syncope or syncope.    Device information MDT dual chamber PPM implanted 06/03/2003, gen change 06/19/2012   Past Medical History:  Diagnosis Date   Asthma    BENIGN PROSTATIC HYPERTROPHY, HX OF 10/25/2008   Bone metastases ([place]) 04/14/2018   BRADYCARDIA 2005   Chronic diastolic congestive heart failure ([name]) 06/05/2016   CIDP (chronic inflammatory demyelinating polyneuropathy) ([place])    HYPERTENSION 11/21/2006   Iron deficiency anemia, unspecified 04/19/2013   Multiple myeloma ([place]) 04/29/2018   NEPHROLITHIASIS, HX OF 11/21/2006 and 10/15/2008   PACEMAKER, PERMANENT 2005   Gen change 2014 Medtronic Adaptic L dual-chamber pacemaker, serial #[id number]    SVT (supraventricular tachycardia) ([place])    TOBACCO ABUSE 10/25/2008   Quit 2012    Past Surgical History:  Procedure Laterality Date   COLONOSCOPY  2004,2009    PACEMAKER INSERTION  05/14/2003   PERMANENT PACEMAKER GENERATOR CHANGE N/A 06/19/2012   Procedure: PERMANENT PACEMAKER GENERATOR CHANGE;  Surgeon: [name], MD; Medtronic Adaptic L dual-chamber pacemaker, serial #[id number]     SKIN GRAFT Right 05/13/1960   wrist   TOOTH EXTRACTION  11/10/2020   3 teeth removed    Current Outpatient Medications  Medication Sig Dispense Refill   acyclovir (ZOVIRAX) 400 MG tablet Take 1 tablet (400 mg total) by mouth 2 (two) times daily. 60 tablet 11   amoxicillin (AMOXIL) 250 MG capsule Take 250 mg by mouth 3 (three) times daily.     aspirin EC 81 MG tablet Take 81 mg by mouth daily.     diltiazem (TIAZAC) 240 MG 24 hr capsule Take 1 capsule by mouth daily.     flecainide (TAMBOCOR) 50 MG tablet Take 75 mg by mouth daily as needed.     furosemide (LASIX) 40 MG tablet Take 40 mg by mouth daily.     irbesartan (AVAPRO) 300 MG tablet Take 300 mg by mouth daily.     lenalidomide (REVLIMID) 10 MG capsule Take 1 capsule (10 mg total) by mouth daily. Take for 21 days on, 7 days off. Repeat every 28 days. 21 capsule 0   memantine (NAMENDA) 5 MG tablet Take 5 mg by mouth 2 (two) times daily.     metoprolol tartrate (LOPRESSOR) 50 MG tablet Take 1 tablet (50 mg total) by mouth 2 (two) times daily. 60 tablet 0   ondansetron (ZOFRAN) 8 MG  tablet Take 1 tablet (8 mg total) by mouth 2 (two) times daily as needed (Nausea or vomiting). 30 tablet 1   polyethylene glycol (MIRALAX / GLYCOLAX) packet Take 17 g by mouth daily. 14 each 0   prochlorperazine (COMPAZINE) 10 MG tablet Take 1 tablet (10 mg total) by mouth every 6 (six) hours as needed (Nausea or vomiting). 30 tablet 1   Saccharomyces boulardii (FLORASTOR PO) Take 250 mg by mouth 2 (two) times daily.     tamsulosin (FLOMAX) 0.4 MG CAPS capsule Take 2 capsules (0.8 mg total) by mouth daily. 90 capsule 0   traMADol (ULTRAM) 50 MG tablet Take 1 tablet (50 mg total) by mouth every 6 (six) hours as needed. 30 tablet 0    No current facility-administered medications for this visit.   Facility-Administered Medications Ordered in Other Visits  Medication Dose Route Frequency Provider Last Rate Last Admin   [MAR Hold] sodium chloride flush (NS) 0.9 % injection 10 mL  10 mL Intracatheter PRN Brunetta Genera, MD        Allergies:   Lexapro [escitalopram oxalate]   Social History:  The patient  reports that he quit smoking about 10 years ago. His smoking use included cigarettes. He started smoking about 56 years ago. He has a 11.50 pack-year smoking history. He has never used smokeless tobacco. He reports current alcohol use. He reports that he does not use drugs.   Family History:  The patient's family history includes Breast cancer in his sister and sister; Diabetes in his daughter and son; Diabetes type II in his brother; Heart attack in his father; Kidney disease in his father; Ovarian cancer in his daughter; Parkinson's disease in his mother.  ROS:  Please see the history of present illness.    All other systems are reviewed and otherwise negative.   PHYSICAL EXAM:  VS:  There were no vitals taken for this visit. BMI: There is no height or weight on file to calculate BMI. Well nourished, well developed, in no acute distress HEENT: normocephalic, atraumatic Neck: no JVD, carotid bruits or masses Cardiac:  RRR; no significant murmurs, no rubs, or gallops Lungs:  CTA b/l, no wheezing, rhonchi or rales Abd: soft, nontender MS: no deformity or atrophy Ext: no edema Skin: warm and dry, no rash Neuro:  No gross deficits appreciated Psych: euthymic mood, full affect  PPM site is stable, no tethering or discomfort   EKG:  Done today and reviewed by myself shows  Baseline interference, SR 70bpm, RBBB (old)  Device interrogation done today and reviewed by myself:  Battery and lead measurements are stable One AMS, no EGM available, <0.1% burden He presents AS/VP, has underlying AV conduction with PR  about 376m or so No changes, PAV/SAV 3067m  08/02/2017: TTE Study Conclusions  - Left ventricle: The cavity size was normal. There was mild    concentric hypertrophy. Systolic function was vigorous. The    estimated ejection fraction was in the range of 65% to 70%. Wall    motion was normal; there were no regional wall motion    abnormalities. Doppler parameters are consistent with abnormal    left ventricular relaxation (grade 1 diastolic dysfunction).    There was no evidence of elevated ventricular filling pressure by    Doppler parameters.  - Aortic valve: Trileaflet; moderately thickened, moderately    calcified leaflets. Transvalvular velocity was within the normal    range. There was no stenosis. There was no regurgitation.  -  Mitral valve: There was trivial regurgitation.  - Right ventricle: Pacer wire or catheter noted in right ventricle.    Systolic function was normal.  - Right atrium: Pacer wire or catheter noted in right atrium.  - Tricuspid valve: There was mild regurgitation.  - Inferior vena cava: The vessel was normal in size.  - Pericardium, extracardiac: There was no pericardial effusion.    Recent Labs: 02/27/2021: ALT 6; BUN 13; Creatinine 1.08; Hemoglobin 11.7; Platelets 202; Potassium 4.2; Sodium 134  No results found for requested labs within last 8760 hours.   Estimated Creatinine Clearance: 60.2 mL/min (by C-G formula based on SCr of 1.08 mg/dL).   Wt Readings from Last 3 Encounters:  02/28/21 203 lb 9.6 oz (92.4 kg)  02/27/21 199 lb 8 oz (90.5 kg)  02/13/21 200 lb 4.8 oz (90.9 kg)     Other studies reviewed: Additional studies/records reviewed today include: summarized above  ASSESSMENT AND PLAN:  PPM Intact function No programming changes made  ATach Notes mention PAFib On flecainide/ dilt <0.1 % burden RBBB is old  HTN Looks OK  Disposition: F/u with remotes as usual and in clinic In a year, sooner if needed  Current medicines  are reviewed at length with the patient today.  The patient did not have any concerns regarding medicines.  Venetia Night, PA-C 03/10/2021 9:32 AM     [address] [place] [address] [phone] (office)  [phone] (fax)

## 2021-03-12 ENCOUNTER — Ambulatory Visit (INDEPENDENT_AMBULATORY_CARE_PROVIDER_SITE_OTHER): Payer: Medicare Other | Admitting: Physician Assistant

## 2021-03-12 ENCOUNTER — Other Ambulatory Visit: Payer: Self-pay

## 2021-03-12 ENCOUNTER — Encounter: Payer: Self-pay | Admitting: Physician Assistant

## 2021-03-12 VITALS — BP 138/72 | HR 70 | Ht 70.0 in | Wt 200.8 lb

## 2021-03-12 DIAGNOSIS — I471 Supraventricular tachycardia: Secondary | ICD-10-CM | POA: Diagnosis not present

## 2021-03-12 DIAGNOSIS — I5032 Chronic diastolic (congestive) heart failure: Secondary | ICD-10-CM

## 2021-03-12 DIAGNOSIS — Z79899 Other long term (current) drug therapy: Secondary | ICD-10-CM | POA: Diagnosis not present

## 2021-03-12 DIAGNOSIS — I480 Paroxysmal atrial fibrillation: Secondary | ICD-10-CM

## 2021-03-12 DIAGNOSIS — I10 Essential (primary) hypertension: Secondary | ICD-10-CM | POA: Diagnosis not present

## 2021-03-12 DIAGNOSIS — Z5181 Encounter for therapeutic drug level monitoring: Secondary | ICD-10-CM

## 2021-03-12 DIAGNOSIS — I495 Sick sinus syndrome: Secondary | ICD-10-CM

## 2021-03-12 DIAGNOSIS — Z950 Presence of cardiac pacemaker: Secondary | ICD-10-CM | POA: Diagnosis not present

## 2021-03-12 LAB — CUP PACEART INCLINIC DEVICE CHECK
Battery Impedance: 1330 Ohm
Battery Remaining Longevity: 51 mo
Battery Voltage: 2.75 V
Brady Statistic AP VP Percent: 20 %
Brady Statistic AP VS Percent: 0 %
Brady Statistic AS VP Percent: 69 %
Brady Statistic AS VS Percent: 12 %
Date Time Interrogation Session: 20221031100728
Implantable Lead Implant Date: 20050121
Implantable Lead Implant Date: 20050121
Implantable Lead Location: 753859
Implantable Lead Location: 753860
Implantable Lead Model: 5076
Implantable Lead Model: 5076
Implantable Pulse Generator Implant Date: 20140207
Lead Channel Impedance Value: 559 Ohm
Lead Channel Impedance Value: 604 Ohm
Lead Channel Pacing Threshold Amplitude: 0.5 V
Lead Channel Pacing Threshold Amplitude: 0.5 V
Lead Channel Pacing Threshold Amplitude: 1.25 V
Lead Channel Pacing Threshold Amplitude: 1.25 V
Lead Channel Pacing Threshold Pulse Width: 0.4 ms
Lead Channel Pacing Threshold Pulse Width: 0.4 ms
Lead Channel Pacing Threshold Pulse Width: 0.4 ms
Lead Channel Pacing Threshold Pulse Width: 0.4 ms
Lead Channel Sensing Intrinsic Amplitude: 2 mV
Lead Channel Sensing Intrinsic Amplitude: 4 mV
Lead Channel Setting Pacing Amplitude: 2.5 V
Lead Channel Setting Pacing Amplitude: 2.5 V
Lead Channel Setting Pacing Pulse Width: 0.4 ms
Lead Channel Setting Sensing Sensitivity: 2 mV

## 2021-03-12 NOTE — Patient Instructions (Addendum)
Medication Instructions:   Your physician recommends that you continue on your current medications as directed. Please refer to the Current Medication list given to you today.   *If you need a refill on your cardiac medications before your next appointment, please call your pharmacy*   Lab Work: [place]   If you have labs (blood work) drawn today and your tests are completely normal, you will receive your results only by: [facility] (if you have MyChart) OR A paper copy in the mail If you have any lab test that is abnormal or we need to change your treatment, we will call you to review the results.   Testing/Procedures: NONE ORDERED  TODAY     Follow-Up: At [facility], you and your health needs are our priority.  As part of our continuing mission to provide you with exceptional heart care, we have created designated Provider Care Teams.  These Care Teams include your primary Cardiologist (physician) and Advanced Practice Providers (APPs -  Physician Assistants and Nurse Practitioners) who all work together to provide you with the care you need, when you need it.  We recommend signing up for the patient portal called "MyChart".  Sign up information is provided on this After Visit Summary.  MyChart is used to connect with patients for Virtual Visits (Telemedicine).  Patients are able to view lab/test results, encounter notes, upcoming appointments, etc.  Non-urgent messages can be sent to your provider as well.   To learn more about what you can do with MyChart, go to [email or web address].    Your next appointment:    1 year   The format for your next appointment:   In Person  Provider:   You may see Dr. Lovena Le or one of the following Advanced Practice Providers on your designated Care Team:   Tommye Standard, [place] Legrand Como "Jonni Sanger" Chalmers Cater, [place]   Other Instructions

## 2021-03-13 ENCOUNTER — Inpatient Hospital Stay: Payer: Medicare Other

## 2021-03-13 ENCOUNTER — Other Ambulatory Visit: Payer: Self-pay | Admitting: Hematology

## 2021-03-13 VITALS — BP 147/73 | HR 60 | Temp 97.5°F | Resp 20 | Wt 202.2 lb

## 2021-03-13 DIAGNOSIS — C9001 Multiple myeloma in remission: Secondary | ICD-10-CM

## 2021-03-13 DIAGNOSIS — Z7189 Other specified counseling: Secondary | ICD-10-CM

## 2021-03-13 DIAGNOSIS — Z5112 Encounter for antineoplastic immunotherapy: Secondary | ICD-10-CM | POA: Insufficient documentation

## 2021-03-13 DIAGNOSIS — Z510 Encounter for antineoplastic radiation therapy: Secondary | ICD-10-CM | POA: Insufficient documentation

## 2021-03-13 DIAGNOSIS — C9000 Multiple myeloma not having achieved remission: Secondary | ICD-10-CM | POA: Insufficient documentation

## 2021-03-13 LAB — CBC WITH DIFFERENTIAL/PLATELET
Abs Immature Granulocytes: 0.02 10*3/uL (ref 0.00–0.07)
Basophils Absolute: 0 10*3/uL (ref 0.0–0.1)
Basophils Relative: 1 %
Eosinophils Absolute: 0.7 10*3/uL — ABNORMAL HIGH (ref 0.0–0.5)
Eosinophils Relative: 14 %
HCT: 34.3 % — ABNORMAL LOW (ref 39.0–52.0)
Hemoglobin: 11.4 g/dL — ABNORMAL LOW (ref 13.0–17.0)
Immature Granulocytes: 0 %
Lymphocytes Relative: 32 %
Lymphs Abs: 1.6 10*3/uL (ref 0.7–4.0)
MCH: 27.2 pg (ref 26.0–34.0)
MCHC: 33.2 g/dL (ref 30.0–36.0)
MCV: 81.9 fL (ref 80.0–100.0)
Monocytes Absolute: 0.8 10*3/uL (ref 0.1–1.0)
Monocytes Relative: 16 %
Neutro Abs: 1.8 10*3/uL (ref 1.7–7.7)
Neutrophils Relative %: 37 %
Platelets: 190 10*3/uL (ref 150–400)
RBC: 4.19 MIL/uL — ABNORMAL LOW (ref 4.22–5.81)
RDW: 13.8 % (ref 11.5–15.5)
WBC: 4.9 10*3/uL (ref 4.0–10.5)
nRBC: 0 % (ref 0.0–0.2)

## 2021-03-13 LAB — CMP (CANCER CENTER ONLY)
ALT: 9 U/L (ref 0–44)
AST: 13 U/L — ABNORMAL LOW (ref 15–41)
Albumin: 3.2 g/dL — ABNORMAL LOW (ref 3.5–5.0)
Alkaline Phosphatase: 78 U/L (ref 38–126)
Anion gap: 9 (ref 5–15)
BUN: 12 mg/dL (ref 8–23)
CO2: 25 mmol/L (ref 22–32)
Calcium: 8.4 mg/dL — ABNORMAL LOW (ref 8.9–10.3)
Chloride: 98 mmol/L (ref 98–111)
Creatinine: 0.91 mg/dL (ref 0.61–1.24)
GFR, Estimated: >60 mL/min (ref >60)
Glucose, Bld: 94 mg/dL (ref 70–99)
Potassium: 4 mmol/L (ref 3.5–5.1)
Sodium: 132 mmol/L — ABNORMAL LOW (ref 135–145)
Total Bilirubin: 0.6 mg/dL (ref 0.3–1.2)
Total Protein: 6.7 g/dL (ref 6.5–8.1)

## 2021-03-13 MED ORDER — DIPHENHYDRAMINE HCL 25 MG PO CAPS
50.0000 mg | ORAL_CAPSULE | Freq: Once | ORAL | Status: AC
  Administered 2021-03-13: 50 mg via ORAL
  Filled 2021-03-13: qty 2

## 2021-03-13 MED ORDER — SODIUM CHLORIDE 0.9 % IV SOLN
16.0000 mg/kg | Freq: Once | INTRAVENOUS | Status: AC
  Administered 2021-03-13: 1600 mg via INTRAVENOUS
  Filled 2021-03-13: qty 80

## 2021-03-13 MED ORDER — ACETAMINOPHEN 325 MG PO TABS
650.0000 mg | ORAL_TABLET | Freq: Once | ORAL | Status: AC
  Administered 2021-03-13: 650 mg via ORAL
  Filled 2021-03-13: qty 2

## 2021-03-13 MED ORDER — SODIUM CHLORIDE 0.9 % IV SOLN: Freq: Once | INTRAVENOUS | Status: AC

## 2021-03-13 MED ORDER — MONTELUKAST SODIUM 10 MG PO TABS
10.0000 mg | ORAL_TABLET | Freq: Once | ORAL | Status: AC
  Administered 2021-03-13: 10 mg via ORAL
  Filled 2021-03-13: qty 1

## 2021-03-13 MED ORDER — SODIUM CHLORIDE 0.9 % IV SOLN
20.0000 mg | Freq: Once | INTRAVENOUS | Status: AC
  Administered 2021-03-13: 20 mg via INTRAVENOUS
  Filled 2021-03-13: qty 20

## 2021-03-13 MED ORDER — FAMOTIDINE 20 MG IN NS 100 ML IVPB
20.0000 mg | Freq: Once | INTRAVENOUS | Status: AC
  Administered 2021-03-13: 20 mg via INTRAVENOUS
  Filled 2021-03-13: qty 100

## 2021-03-13 NOTE — Patient Instructions (Signed)
[facility]ICAL [facility]COLOG[facility]  Discharge Instructions: Thank you for choosing [facility] to provide your oncology and hematology care.   If you have a lab appointment with the Cancer Center, please go directly to the Cancer Center and check in at the registration area.   Wear comfortable clothing and clothing appropriate for easy access to any Portacath or PICC line.   We strive to give you quality time with your provider. You may need to reschedule your appointment if you arrive late (15 or more minutes).  Arriving late affects you and other patients whose appointments are after yours.  Also, if you miss three or more appointments without notifying the office, you may be dismissed from the clinic at the provider's discretion.      For prescription refill requests, have your pharmacy contact our office and allow 72 hours for refills to be completed.    Today you received the following chemotherapy and/or immunotherapy agents darzalex      To help prevent nausea and vomiting after your treatment, we encourage you to take your nausea medication as directed.  BELOW ARE SYMPTOMS THAT SHOULD BE REPORTED IMMEDIATELY: *FEVER GREATER THAN 100.4 F (38 C) OR HIGHER *CHILLS OR SWEATING *NAUSEA AND VOMITING THAT IS NOT CONTROLLED WITH YOUR NAUSEA MEDICATION *UNUSUAL SHORTNESS OF BREATH *UNUSUAL BRUISING OR BLEEDING *URINARY PROBLEMS (pain or burning when urinating, or frequent urination) *BOWEL PROBLEMS (unusual diarrhea, constipation, pain near the anus) TENDERNESS IN MOUTH AND THROAT WITH OR WITHOUT PRESENCE OF ULCERS (sore throat, sores in mouth, or a toothache) UNUSUAL RASH, SWELLING OR PAIN  UNUSUAL VAGINAL DISCHARGE OR ITCHING   Items with * indicate a potential emergency and should be followed up as soon as possible or go to the Emergency Department if any problems should occur.  Please show the CHEMOTHERAPY ALERT CARD or IMMUNOTHERAPY ALERT CARD at check-in to  the Emergency Department and triage nurse.  Should you have questions after your visit or need to cancel or reschedule your appointment, please contact [facility]  Dept: [phone]  and follow the prompts.  Office hours are 8:00 a.m. to 4:30 p.m. Monday - Friday. Please note that voicemails left after 4:00 p.m. may not be returned until the following business day.  We are closed weekends and major holidays. You have access to a nurse at all times for urgent questions. Please call the main number to the clinic Dept: [phone] and follow the prompts.   For any non-urgent questions, you may also contact your provider using MyChart. We now offer e-Visits for anyone 18 and older to request care online for non-urgent symptoms. For details visit [email or web address].   Also download the MyChart app! Go to the app store, search "MyChart", open the app, select [facility], and log in with your MyChart username and password.  Due to Covid, a mask is required upon entering the hospital/clinic. If you do not have a mask, one will be given to you upon arrival. For doctor visits, patients may have 1 support person aged 18 or older with them. For treatment visits, patients cannot have anyone with them due to current Covid guidelines and our immunocompromised population.  

## 2021-03-14 ENCOUNTER — Ambulatory Visit: Payer: Medicare Other | Admitting: Radiation Oncology

## 2021-03-16 NOTE — Progress Notes (Unsigned)
Dr Hardie Shackleton called and wants to speak with Dr Irene Limbo regarding this pt. 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 [phone]

## 2021-03-20 ENCOUNTER — Other Ambulatory Visit: Payer: Self-pay

## 2021-03-20 ENCOUNTER — Ambulatory Visit
Admission: RE | Admit: 2021-03-20 | Discharge: 2021-03-20 | Disposition: A | Discharge status: Home / Self Care | Payer: Medicare Other | Source: Ambulatory Visit | Attending: Radiation Oncology | Admitting: Radiation Oncology

## 2021-03-20 DIAGNOSIS — C9000 Multiple myeloma not having achieved remission: Secondary | ICD-10-CM | POA: Diagnosis not present

## 2021-03-20 DIAGNOSIS — Z510 Encounter for antineoplastic radiation therapy: Secondary | ICD-10-CM | POA: Diagnosis not present

## 2021-03-20 DIAGNOSIS — C7951 Secondary malignant neoplasm of bone: Secondary | ICD-10-CM | POA: Diagnosis not present

## 2021-03-21 ENCOUNTER — Other Ambulatory Visit: Payer: Self-pay | Admitting: Hematology

## 2021-03-21 ENCOUNTER — Ambulatory Visit: Payer: Medicare Other | Admitting: Radiation Oncology

## 2021-03-21 DIAGNOSIS — C9000 Multiple myeloma not having achieved remission: Secondary | ICD-10-CM

## 2021-03-22 ENCOUNTER — Ambulatory Visit: Payer: Medicare Other | Admitting: Radiation Oncology

## 2021-03-22 ENCOUNTER — Ambulatory Visit: Payer: Medicare Other

## 2021-03-23 ENCOUNTER — Ambulatory Visit: Payer: Medicare Other

## 2021-03-25 DIAGNOSIS — Z510 Encounter for antineoplastic radiation therapy: Secondary | ICD-10-CM | POA: Diagnosis not present

## 2021-03-25 DIAGNOSIS — C9000 Multiple myeloma not having achieved remission: Secondary | ICD-10-CM | POA: Diagnosis not present

## 2021-03-25 DIAGNOSIS — C7951 Secondary malignant neoplasm of bone: Secondary | ICD-10-CM | POA: Diagnosis not present

## 2021-03-26 ENCOUNTER — Other Ambulatory Visit: Payer: Self-pay

## 2021-03-26 ENCOUNTER — Ambulatory Visit
Admission: RE | Admit: 2021-03-26 | Discharge: 2021-03-26 | Disposition: A | Discharge status: Home / Self Care | Payer: Medicare Other | Source: Ambulatory Visit | Attending: Radiation Oncology | Admitting: Radiation Oncology

## 2021-03-26 DIAGNOSIS — C7951 Secondary malignant neoplasm of bone: Secondary | ICD-10-CM | POA: Diagnosis not present

## 2021-03-26 DIAGNOSIS — C9000 Multiple myeloma not having achieved remission: Secondary | ICD-10-CM | POA: Diagnosis not present

## 2021-03-26 DIAGNOSIS — Z510 Encounter for antineoplastic radiation therapy: Secondary | ICD-10-CM | POA: Diagnosis not present

## 2021-03-26 DIAGNOSIS — Z23 Encounter for immunization: Secondary | ICD-10-CM | POA: Diagnosis not present

## 2021-03-27 ENCOUNTER — Inpatient Hospital Stay (HOSPITAL_BASED_OUTPATIENT_CLINIC_OR_DEPARTMENT_OTHER): Payer: Medicare Other | Admitting: Hematology

## 2021-03-27 ENCOUNTER — Inpatient Hospital Stay: Payer: Medicare Other

## 2021-03-27 ENCOUNTER — Ambulatory Visit
Admission: RE | Admit: 2021-03-27 | Discharge: 2021-03-27 | Disposition: A | Discharge status: Home / Self Care | Payer: Medicare Other | Source: Ambulatory Visit | Attending: Radiation Oncology | Admitting: Radiation Oncology

## 2021-03-27 ENCOUNTER — Inpatient Hospital Stay: Payer: Medicare Other | Admitting: Dietician

## 2021-03-27 VITALS — BP 148/73 | HR 60 | Temp 98.2°F | Resp 18

## 2021-03-27 VITALS — BP 146/79 | HR 76 | Temp 97.5°F | Resp 18 | Wt 200.7 lb

## 2021-03-27 DIAGNOSIS — Z803 Family history of malignant neoplasm of breast: Secondary | ICD-10-CM

## 2021-03-27 DIAGNOSIS — I10 Essential (primary) hypertension: Secondary | ICD-10-CM

## 2021-03-27 DIAGNOSIS — C7951 Secondary malignant neoplasm of bone: Secondary | ICD-10-CM | POA: Diagnosis not present

## 2021-03-27 DIAGNOSIS — C9002 Multiple myeloma in relapse: Secondary | ICD-10-CM

## 2021-03-27 DIAGNOSIS — Z87891 Personal history of nicotine dependence: Secondary | ICD-10-CM

## 2021-03-27 DIAGNOSIS — Z510 Encounter for antineoplastic radiation therapy: Secondary | ICD-10-CM | POA: Diagnosis not present

## 2021-03-27 DIAGNOSIS — Z5112 Encounter for antineoplastic immunotherapy: Secondary | ICD-10-CM

## 2021-03-27 DIAGNOSIS — Z8041 Family history of malignant neoplasm of ovary: Secondary | ICD-10-CM

## 2021-03-27 DIAGNOSIS — C9001 Multiple myeloma in remission: Secondary | ICD-10-CM

## 2021-03-27 DIAGNOSIS — Z7189 Other specified counseling: Secondary | ICD-10-CM

## 2021-03-27 LAB — CBC WITH DIFFERENTIAL/PLATELET
Abs Immature Granulocytes: 0.01 10*3/uL (ref 0.00–0.07)
Basophils Absolute: 0.1 10*3/uL (ref 0.0–0.1)
Basophils Relative: 2 %
Eosinophils Absolute: 0.3 10*3/uL (ref 0.0–0.5)
Eosinophils Relative: 7 %
HCT: 33.8 % — ABNORMAL LOW (ref 39.0–52.0)
Hemoglobin: 11.4 g/dL — ABNORMAL LOW (ref 13.0–17.0)
Immature Granulocytes: 0 %
Lymphocytes Relative: 29 %
Lymphs Abs: 1.4 10*3/uL (ref 0.7–4.0)
MCH: 27.5 pg (ref 26.0–34.0)
MCHC: 33.7 g/dL (ref 30.0–36.0)
MCV: 81.6 fL (ref 80.0–100.0)
Monocytes Absolute: 0.6 10*3/uL (ref 0.1–1.0)
Monocytes Relative: 13 %
Neutro Abs: 2.4 10*3/uL (ref 1.7–7.7)
Neutrophils Relative %: 49 %
Platelets: 227 10*3/uL (ref 150–400)
RBC: 4.14 MIL/uL — ABNORMAL LOW (ref 4.22–5.81)
RDW: 14 % (ref 11.5–15.5)
WBC: 4.8 10*3/uL (ref 4.0–10.5)
nRBC: 0 % (ref 0.0–0.2)

## 2021-03-27 LAB — CMP (CANCER CENTER ONLY)
ALT: 8 U/L (ref 0–44)
AST: 12 U/L — ABNORMAL LOW (ref 15–41)
Albumin: 3.3 g/dL — ABNORMAL LOW (ref 3.5–5.0)
Alkaline Phosphatase: 92 U/L (ref 38–126)
Anion gap: 10 (ref 5–15)
BUN: 11 mg/dL (ref 8–23)
CO2: 23 mmol/L (ref 22–32)
Calcium: 8.8 mg/dL — ABNORMAL LOW (ref 8.9–10.3)
Chloride: 102 mmol/L (ref 98–111)
Creatinine: 0.88 mg/dL (ref 0.61–1.24)
GFR, Estimated: >60 mL/min (ref >60)
Glucose, Bld: 103 mg/dL — ABNORMAL HIGH (ref 70–99)
Potassium: 4.1 mmol/L (ref 3.5–5.1)
Sodium: 135 mmol/L (ref 135–145)
Total Bilirubin: 0.7 mg/dL (ref 0.3–1.2)
Total Protein: 6.6 g/dL (ref 6.5–8.1)

## 2021-03-27 MED ORDER — SODIUM CHLORIDE 0.9 % IV SOLN: Freq: Once | INTRAVENOUS | Status: AC

## 2021-03-27 MED ORDER — FAMOTIDINE 20 MG IN NS 100 ML IVPB
20.0000 mg | Freq: Once | INTRAVENOUS | Status: AC
  Administered 2021-03-27: 20 mg via INTRAVENOUS
  Filled 2021-03-27: qty 100

## 2021-03-27 MED ORDER — SODIUM CHLORIDE 0.9 % IV SOLN
20.0000 mg | Freq: Once | INTRAVENOUS | Status: AC
  Administered 2021-03-27: 20 mg via INTRAVENOUS
  Filled 2021-03-27: qty 20

## 2021-03-27 MED ORDER — SODIUM CHLORIDE 0.9 % IV SOLN
16.0000 mg/kg | Freq: Once | INTRAVENOUS | Status: AC
  Administered 2021-03-27: 1600 mg via INTRAVENOUS
  Filled 2021-03-27: qty 80

## 2021-03-27 MED ORDER — ACETAMINOPHEN 325 MG PO TABS
650.0000 mg | ORAL_TABLET | Freq: Once | ORAL | Status: AC
  Administered 2021-03-27: 650 mg via ORAL
  Filled 2021-03-27: qty 2

## 2021-03-27 MED ORDER — DIPHENHYDRAMINE HCL 25 MG PO CAPS
50.0000 mg | ORAL_CAPSULE | Freq: Once | ORAL | Status: AC
  Administered 2021-03-27: 50 mg via ORAL
  Filled 2021-03-27: qty 2

## 2021-03-27 MED ORDER — MONTELUKAST SODIUM 10 MG PO TABS
10.0000 mg | ORAL_TABLET | Freq: Once | ORAL | Status: AC
  Administered 2021-03-27: 10 mg via ORAL
  Filled 2021-03-27: qty 1

## 2021-03-27 NOTE — Progress Notes (Signed)
Nutrition Follow-up:  Patient with multiple myeloma. He is receiving Revlimid/daratumumab   Met with patient during infusion. He reports appetite is slowly coming back. He is eating 3 meals daily at facility. Patient recalls lots of soups and beans. He is drinking Ensure supplements, but not every day. He denies nausea, vomiting, constipation, diarrhea. Patient with hiccups today, he reports chronic frequent episodes. Patient reports this occurs most often after he has been talking for an extended period of time.   Medications: reviewed   Labs: Glucose 103  Anthropometrics: Weight 200 lb 11.2 oz today stable.   11/1 - 202 lb 4 oz 10/31 - 200 lb 12.8 oz 10/18 - 199 lb 8 oz  NUTRITION DIAGNOSIS: Unintentional weight loss stable   INTERVENTION:  Encouraged high calorie, high protein snacks in between meals - pt has handout Encouraged patient to drink one Ensure Plus/equivalent daily for added calories and protein - pt politely declined coupons Discussed tips for reducing gas and bloating Possible benefit of Baclofen for chronic hiccups? - message sent to provider Patient has contact information    MONITORING, EVALUATION, GOAL: weight trends, intake   NEXT VISIT: To be scheduled as needed with treatment    

## 2021-03-27 NOTE — Patient Instructions (Signed)
[facility]  Discharge Instructions: °Thank you for choosing [facility] to provide your oncology and hematology care.  ° °If you have a lab appointment with the Cancer Center, please go directly to the Cancer Center and check in at the registration area. °  °Wear comfortable clothing and clothing appropriate for easy access to any Portacath or PICC line.  ° °We strive to give you quality time with your provider. You may need to reschedule your appointment if you arrive late (15 or more minutes).  Arriving late affects you and other patients whose appointments are after yours.  Also, if you miss three or more appointments without notifying the office, you may be dismissed from the clinic at the provider’s discretion.    °  °For prescription refill requests, have your pharmacy contact our office and allow 72 hours for refills to be completed.   ° °Today you received the following chemotherapy and/or immunotherapy agents: Darzalex  °  °To help prevent nausea and vomiting after your treatment, we encourage you to take your nausea medication as directed. ° °BELOW ARE SYMPTOMS THAT SHOULD BE REPORTED IMMEDIATELY: °*FEVER GREATER THAN 100.4 F (38 °C) OR HIGHER °*CHILLS OR SWEATING °*NAUSEA AND VOMITING THAT IS NOT CONTROLLED WITH YOUR NAUSEA MEDICATION °*UNUSUAL SHORTNESS OF BREATH °*UNUSUAL BRUISING OR BLEEDING °*URINARY PROBLEMS (pain or burning when urinating, or frequent urination) °*BOWEL PROBLEMS (unusual diarrhea, constipation, pain near the anus) °TENDERNESS IN MOUTH AND THROAT WITH OR WITHOUT PRESENCE OF ULCERS (sore throat, sores in mouth, or a toothache) °UNUSUAL RASH, SWELLING OR PAIN  °UNUSUAL VAGINAL DISCHARGE OR ITCHING  ° °Items with * indicate a potential emergency and should be followed up as soon as possible or go to the Emergency Department if any problems should occur. ° °Please show the CHEMOTHERAPY ALERT CARD or IMMUNOTHERAPY ALERT CARD at check-in to the  Emergency Department and triage nurse. ° °Should you have questions after your visit or need to cancel or reschedule your appointment, please contact [facility]  Dept: [phone]  and follow the prompts.  Office hours are 8:00 a.m. to 4:30 p.m. Monday - Friday. Please note that voicemails left after 4:00 p.m. may not be returned until the following business day.  We are closed weekends and major holidays. You have access to a nurse at all times for urgent questions. Please call the main number to the clinic Dept: [phone] and follow the prompts. ° ° °For any non-urgent questions, you may also contact your provider using MyChart. We now offer e-Visits for anyone 18 and older to request care online for non-urgent symptoms. For details visit [email or web address]. °  °Also download the MyChart app! Go to the app store, search "MyChart", open the app, select [facility], and log in with your MyChart username and password. ° °Due to Covid, a mask is required upon entering the hospital/clinic. If you do not have a mask, one will be given to you upon arrival. For doctor visits, patients may have 1 support person aged 18 or older with them. For treatment visits, patients cannot have anyone with them due to current Covid guidelines and our immunocompromised population.  ° °

## 2021-03-28 ENCOUNTER — Ambulatory Visit
Admission: RE | Admit: 2021-03-28 | Discharge: 2021-03-28 | Disposition: A | Discharge status: Home / Self Care | Payer: Medicare Other | Source: Ambulatory Visit | Attending: Radiation Oncology | Admitting: Radiation Oncology

## 2021-03-28 ENCOUNTER — Other Ambulatory Visit: Payer: Self-pay

## 2021-03-28 ENCOUNTER — Telehealth: Payer: Self-pay | Admitting: Hematology

## 2021-03-28 DIAGNOSIS — Z510 Encounter for antineoplastic radiation therapy: Secondary | ICD-10-CM | POA: Diagnosis not present

## 2021-03-28 NOTE — Telephone Encounter (Signed)
Left message with follow-up appointments per 11/15 los.

## 2021-03-29 ENCOUNTER — Ambulatory Visit
Admission: RE | Admit: 2021-03-29 | Discharge: 2021-03-29 | Disposition: A | Discharge status: Home / Self Care | Payer: Medicare Other | Source: Ambulatory Visit | Attending: Radiation Oncology | Admitting: Radiation Oncology

## 2021-03-29 DIAGNOSIS — Z510 Encounter for antineoplastic radiation therapy: Secondary | ICD-10-CM | POA: Diagnosis not present

## 2021-03-30 ENCOUNTER — Ambulatory Visit
Admission: RE | Admit: 2021-03-30 | Discharge: 2021-03-30 | Disposition: A | Discharge status: Home / Self Care | Payer: Medicare Other | Source: Ambulatory Visit | Attending: Radiation Oncology | Admitting: Radiation Oncology

## 2021-03-30 ENCOUNTER — Other Ambulatory Visit: Payer: Self-pay

## 2021-03-30 DIAGNOSIS — Z510 Encounter for antineoplastic radiation therapy: Secondary | ICD-10-CM | POA: Diagnosis not present

## 2021-03-30 LAB — MULTIPLE MYELOMA PANEL, SERUM
Albumin SerPl Elph-Mcnc: 3.3 g/dL (ref 2.9–4.4)
Albumin/Glob SerPl: 1.2 (ref 0.7–1.7)
Alpha 1: 0.3 g/dL (ref 0.0–0.4)
Alpha2 Glob SerPl Elph-Mcnc: 1 g/dL (ref 0.4–1.0)
B-Globulin SerPl Elph-Mcnc: 1.2 g/dL (ref 0.7–1.3)
Gamma Glob SerPl Elph-Mcnc: 0.5 g/dL (ref 0.4–1.8)
Globulin, Total: 2.9 g/dL (ref 2.2–3.9)
IgA: 323 mg/dL (ref 61–437)
IgG (Immunoglobin G), Serum: 584 mg/dL — ABNORMAL LOW (ref 603–1613)
IgM (Immunoglobulin M), Srm: 32 mg/dL (ref 15–143)
M Protein SerPl Elph-Mcnc: 0.3 g/dL — ABNORMAL HIGH
Total Protein ELP: 6.2 g/dL (ref 6.0–8.5)

## 2021-04-02 ENCOUNTER — Encounter: Payer: Self-pay | Admitting: Hematology

## 2021-04-02 ENCOUNTER — Ambulatory Visit: Payer: Medicare Other

## 2021-04-03 ENCOUNTER — Ambulatory Visit
Admission: RE | Admit: 2021-04-03 | Discharge: 2021-04-03 | Disposition: A | Discharge status: Home / Self Care | Payer: Medicare Other | Source: Ambulatory Visit | Attending: Radiation Oncology | Admitting: Radiation Oncology

## 2021-04-03 ENCOUNTER — Ambulatory Visit: Payer: Medicare Other

## 2021-04-03 DIAGNOSIS — C9000 Multiple myeloma not having achieved remission: Secondary | ICD-10-CM | POA: Diagnosis not present

## 2021-04-03 DIAGNOSIS — Z510 Encounter for antineoplastic radiation therapy: Secondary | ICD-10-CM | POA: Diagnosis not present

## 2021-04-03 DIAGNOSIS — C7951 Secondary malignant neoplasm of bone: Secondary | ICD-10-CM | POA: Diagnosis not present

## 2021-04-04 ENCOUNTER — Ambulatory Visit
Admission: RE | Admit: 2021-04-04 | Discharge: 2021-04-04 | Disposition: A | Discharge status: Home / Self Care | Payer: Medicare Other | Source: Ambulatory Visit | Attending: Radiation Oncology | Admitting: Radiation Oncology

## 2021-04-04 ENCOUNTER — Ambulatory Visit: Payer: Medicare Other

## 2021-04-04 ENCOUNTER — Telehealth: Payer: Self-pay

## 2021-04-04 ENCOUNTER — Other Ambulatory Visit: Payer: Self-pay

## 2021-04-04 ENCOUNTER — Ambulatory Visit (INDEPENDENT_AMBULATORY_CARE_PROVIDER_SITE_OTHER): Payer: Medicare Other

## 2021-04-04 DIAGNOSIS — Z510 Encounter for antineoplastic radiation therapy: Secondary | ICD-10-CM | POA: Diagnosis not present

## 2021-04-04 DIAGNOSIS — I495 Sick sinus syndrome: Secondary | ICD-10-CM

## 2021-04-04 LAB — CUP PACEART REMOTE DEVICE CHECK
Battery Impedance: 1361 Ohm
Battery Remaining Longevity: 52 mo
Battery Voltage: 2.75 V
Brady Statistic AP VP Percent: 23 %
Brady Statistic AP VS Percent: 1 %
Brady Statistic AS VP Percent: 48 %
Brady Statistic AS VS Percent: 28 %
Date Time Interrogation Session: 20221123090033
Implantable Lead Implant Date: 20050121
Implantable Lead Implant Date: 20050121
Implantable Lead Location: 753859
Implantable Lead Location: 753860
Implantable Lead Model: 5076
Implantable Lead Model: 5076
Implantable Pulse Generator Implant Date: 20140207
Lead Channel Impedance Value: 527 Ohm
Lead Channel Impedance Value: 631 Ohm
Lead Channel Pacing Threshold Amplitude: 0.5 V
Lead Channel Pacing Threshold Amplitude: 1 V
Lead Channel Pacing Threshold Pulse Width: 0.4 ms
Lead Channel Pacing Threshold Pulse Width: 0.4 ms
Lead Channel Setting Pacing Amplitude: 2 V
Lead Channel Setting Pacing Amplitude: 2.5 V
Lead Channel Setting Pacing Pulse Width: 0.4 ms
Lead Channel Setting Sensing Sensitivity: 2 mV

## 2021-04-04 NOTE — Telephone Encounter (Signed)
Message left for pt's daughter per Dr Irene Limbo:   Dr Irene Limbo did talk to Mr. 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  He recommended that he be seen urgently for additional debridement or dental extractions.   Requested daughter to call Dr Hardie Shackleton office as soon as possible.

## 2021-04-05 ENCOUNTER — Ambulatory Visit: Payer: Medicare Other

## 2021-04-06 ENCOUNTER — Ambulatory Visit: Payer: Medicare Other

## 2021-04-09 ENCOUNTER — Ambulatory Visit: Payer: Medicare Other

## 2021-04-09 ENCOUNTER — Other Ambulatory Visit: Payer: Self-pay

## 2021-04-09 ENCOUNTER — Ambulatory Visit
Admission: RE | Admit: 2021-04-09 | Discharge: 2021-04-09 | Disposition: A | Discharge status: Home / Self Care | Payer: Medicare Other | Source: Ambulatory Visit | Attending: Radiation Oncology | Admitting: Radiation Oncology

## 2021-04-09 DIAGNOSIS — Z510 Encounter for antineoplastic radiation therapy: Secondary | ICD-10-CM | POA: Diagnosis not present

## 2021-04-09 NOTE — Progress Notes (Incomplete)
[place] °Multiple myeloma °No symptoms °chest wall lesion at the level of the axilla, the right scapula, and the SI joint  °

## 2021-04-09 NOTE — Progress Notes (Signed)
HEMATOLOGY/ONCOLOGY CLINIC NOTE  Date of Service: .03/27/2021   Patient Care Team: [name], MD as PCP - General (Family Medicine) [name], MD (Cardiology) [name], MD (Cardiology) [name], DO as Consulting Physician (Neurology)  CHIEF COMPLAINTS/PURPOSE OF CONSULTATION:  Continue mx of myeloma  HISTORY OF PRESENTING ILLNESS:   Jonathon [name] is a wonderful 85 y.o. male who has been referred to [place] by Dr. [name] for evaluation and management of Lytic bone lesions. He is accompanied today by his daughter. The pt reports that he is doing well overall. He is currently a resident at [facility] in [place], [place].   The pt presented to the ED on 03/24/18 with a right distal clavicle fracture, which occurred after trying to get out of bed and pushing himself up with his right arm. He notes that his energy has been noticeably decreased for the last month. He also notes that his lower back has a slight sting when trying to stand up from sitting. He also notes some pain in his lower, right chest wall, which is exacerbated when coughing. The pt has been taking 85m Tramadol which is controlling his pain. He does endorse some weakening of both of his lower extremities in the past 1-2 months, which he attributes somewhat to his recent lower back pain.   The pt denies any bowel abnormalities recently. He notes that he stays very well hydrated, and urinates 4-5 times a night, and does have BPH, and takes Flomax. The pt denies any recent changes in his urination habits.   The pt notes that his appetite decreased for a few months, until he began assisted living. He has lost 9 pounds in the last week.   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 He now uses a walker to ambulate. He notes that the CIDP symptoms are primarily located to his  legs. He sees Dr. [name] Neurology. He took steroids without any symptomatic relief. IVIG was cost-prohibitive for the patient, but was recommended.    Regarding the patient's anemia in April 2019, he denies any obvious bleeding at that time, and denies surgeries.    The pt notes that his last colonoscopy, in 2015, was not concerning, which is corroborated by chart review.   The pt and daughter deny any concerns for memory issues, and the daughter adds his memory is "elephant-like". They are not sure why Namenda is on his medication list.   The pt denies any recent dental procedures, or concerns for dental issues. He has maintained every 6 month dental cleanings for all of his life.   Of note prior to the patient's visit today, pt has had a CT C/A/P completed on 03/31/18 with results revealing IMPRESSION: 1. Lytic lesion throughout all visualized vertebra from the lower cervical spine through the sacrum. Epidural extension of tumor T2, T3 and L2 level. Right L3-4 foraminal extension tumor. 2. Lytic lesions involving majority of ribs. Multiple pathologic rib fractures with bony expansion/sub pleural extension of tumor at several levels. 3. Lytic lesions of the scapula, hips bilaterally and pelvis bilaterally. With further progression of tumor, patient may be at risk for pathologic fractures. Pathologic fracture right clavicle incompletely assessed. 4. Circumferential narrowing sigmoid colon. Question possibility of underlying mass (series 2, image 97). Alternatively this could represent muscular hypertrophy/peristalsis. 5.  Gastric antrum circumferential narrowing. Cannot exclude mass although this may be related to peristalsis. 6. No primary lung malignancy noted. 7. Matted aortic pulmonary window lymph nodes. 8. Circumferential urinary bladder wall thickening greater anterior dome region. Right lateral bladder diverticulum. 9. 5 mm low-density lesion pancreatic body (series 5, image 10)  unchanged. This is felt unlikely to be a primary malignancy. 10. Right parapelvic 2.2 cm cyst without significant change. Scattered low-density renal lesions bila2terally too small to adequately characterize. Some were present previously. Statistically these are likely cysts. 11. Stable adrenal gland hyperplasia. 12. Gynecomastia. 13. Prostate gland causes slight impression upon the bladder base. 14.  Aortic Atherosclerosis. 15. Sequential pacemaker in place. 16. Gallstones.  Most recent lab results (03/25/18) of CBC w/diff and CMP is as follows: all values are WNL except for RBC at 4.14, HGB at 11.3, HCT at 33.6, Chloride at 94, CO2 at 33, Glucose at 100, BUN at 25, Total Protein at 9.4, Albumin at 3.4, Calcium at 13.8, GFR at 58. 03/25/18 PSA, Total was normal at 1.2  On review of systems, pt reports positional lower back pain, lower right chest wall pain, recently decreased energy levels, relatively weaker appetite, weight loss, right clavicle pain, weakening of the lower extremities, moving his bowels well, and denies changes in urination, abdominal pains, changes in breathing, changes in bowel habits, pain along the spine, leg swelling, and any other symptoms.  INTERVAL HISTORY:   Jonathon [name] returns today for management and evaluation of his Multiple Myeloma. The patient's last visit with [place] was on 02/13/2021.  Patient reports he has completely resolved from his COVID-19 infection.  Has no acute new symptoms. His bone pains have improved with radiation he is not having his shoulder pain or back pain in the same way.  Lab results today 03/27/2021 CBC shows hemoglobin of 11.4, WBC count of 4.8k and platelets of 227k CMP within normal limits Myeloma panel-shows M spike of 0.3+0.1 equal to 0.4 g/dL  Patient notes no toxicities from his daratumumab Revlimid dexamethasone treatment.    MEDICAL HISTORY:  Past Medical History:  Diagnosis Date   Asthma    BENIGN PROSTATIC HYPERTROPHY, HX OF  10/25/2008   Bone metastases ([place]) 04/14/2018   BRADYCARDIA 2005   Chronic diastolic congestive heart failure ([place]) 06/05/2016   CIDP (chronic inflammatory demyelinating polyneuropathy) ([place])    HYPERTENSION 11/21/2006   Iron deficiency anemia, unspecified 04/19/2013   Multiple myeloma ([place]) 04/29/2018   NEPHROLITHIASIS, HX OF 11/21/2006 and 10/15/2008   PACEMAKER, PERMANENT 2005   Gen change 2014 Medtronic Adaptic L dual-chamber pacemaker, serial #[id number]    SVT (supraventricular tachycardia) ([place])    TOBACCO ABUSE 10/25/2008   Quit 2012    SURGICAL HISTORY: Past Surgical History:  Procedure Laterality Date   COLONOSCOPY  2004,2009   PACEMAKER INSERTION  05/14/2003   PERMANENT PACEMAKER GENERATOR CHANGE N/A 06/19/2012   Procedure: PERMANENT PACEMAKER GENERATOR CHANGE;  Surgeon: [name], MD; Medtronic Adaptic L dual-chamber pacemaker, serial #[id number]     SKIN GRAFT Right 05/13/1960   wrist   TOOTH EXTRACTION  11/10/2020   3 teeth removed    SOCIAL HISTORY: Social History   Socioeconomic History   Marital status: Divorced    Spouse name: Not on file   Number of children: 4   Years of education: Not on file   Highest education level: Not on file  Occupational History   Occupation: ENVIROMENTAL SERVICE    Employer: [employer]   Occupation: retired  Tobacco Use   Smoking status: Former    Packs/day: 0.25    Years: 46.00    Pack years: 11.50    Types: Cigarettes    Start date: 03/16/1965    Quit date: 06/15/2010    Years since quitting: 10.8   Smokeless tobacco: Never   Tobacco comments:    quit 3 years ago  Vaping Use   Vaping Use: Never used  Substance and Sexual Activity   Alcohol use: Yes    Comment: Occasional wine with dinner   Drug use: No   Sexual activity: Not on file  Other Topics Concern   Not on file  Social History Narrative   Has relocated from [place] in 2007. 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  Patient in a facility thru [facility]   Social  Determinants of Health   Financial Resource Strain: Low Risk    Difficulty of Paying Living Expenses: Not hard at all  Food Insecurity: No Food Insecurity   Worried About Charity fundraiser in the Last Year: Never true   Arboriculturist in the Last Year: Never true  Transportation Needs: No Transportation Needs   Lack of Transportation (Medical): No   Lack of Transportation (Non-Medical): No  Physical Activity: Not on file  Stress: Not on file  Social Connections: Unknown   Frequency of Communication with Friends and Family: More than three times a week   Frequency of Social Gatherings with Friends and Family: More than three times a week   Attends Religious Services: Not on Electrical engineer or Organizations: Not on file   Attends Archivist Meetings: Not on file   Marital Status: Not on file  Intimate Partner Violence: Not on file    FAMILY HISTORY: Family History  Problem Relation Age of Onset   Heart attack Father        Died, 40   Kidney disease Father    Parkinson's disease Mother        Died, 75   Breast cancer Sister        Living, 78   Diabetes type II Brother        Living, 90   Breast cancer Sister        Living, 40   Diabetes Daughter        Living, 71   Diabetes Son        Living, 61   Ovarian cancer Daughter    Colon cancer Neg Hx    Rectal cancer Neg Hx    Stomach cancer Neg Hx     ALLERGIES:  is allergic to lexapro [escitalopram oxalate].  MEDICATIONS:  Current Outpatient Medications  Medication Sig Dispense Refill   acyclovir (ZOVIRAX) 400 MG tablet Take 1 tablet (400 mg total) by mouth 2 (two) times daily. 60 tablet 11   amoxicillin (AMOXIL) 250 MG capsule Take 250 mg by mouth 3 (three) times daily.     aspirin EC 81 MG tablet Take 81 mg by mouth daily.     diltiazem (TIAZAC) 240 MG 24 hr capsule Take 1 capsule by mouth daily.     flecainide (TAMBOCOR) 50 MG tablet Take 75 mg by mouth daily as needed.     furosemide  (LASIX) 40 MG tablet Take 40 mg by mouth daily.     irbesartan (AVAPRO) 300 MG tablet Take 300 mg by mouth daily.     memantine (NAMENDA) 5 MG tablet Take 5 mg by mouth 2 (two) times  daily.     metoprolol tartrate (LOPRESSOR) 50 MG tablet Take 1 tablet (50 mg total) by mouth 2 (two) times daily. 60 tablet 0   ondansetron (ZOFRAN) 8 MG tablet Take 1 tablet (8 mg total) by mouth 2 (two) times daily as needed (Nausea or vomiting). 30 tablet 1   polyethylene glycol (MIRALAX / GLYCOLAX) packet Take 17 g by mouth daily. 14 each 0   prochlorperazine (COMPAZINE) 10 MG tablet Take 1 tablet (10 mg total) by mouth every 6 (six) hours as needed (Nausea or vomiting). 30 tablet 1   REVLIMID 10 MG capsule TAKE 1 CAPSULE BY MOUTH ONCE DAILY FOR 21 DAYS ON AND 7 DAYS OFF 21 capsule 0   Saccharomyces boulardii (FLORASTOR PO) Take 250 mg by mouth 2 (two) times daily.     tamsulosin (FLOMAX) 0.4 MG CAPS capsule Take 2 capsules (0.8 mg total) by mouth daily. 90 capsule 0   traMADol (ULTRAM) 50 MG tablet Take 1 tablet (50 mg total) by mouth every 6 (six) hours as needed. 30 tablet 0   No current facility-administered medications for this visit.   Facility-Administered Medications Ordered in Other Visits  Medication Dose Route Frequency Provider Last Rate Last Admin   [MAR Hold] sodium chloride flush (NS) 0.9 % injection 10 mL  10 mL Intracatheter PRN Brunetta Genera, MD        REVIEW OF SYSTEMS:   .10 Point review of Systems was done is negative except as noted above.   PHYSICAL EXAMINATION: ECOG PERFORMANCE STATUS: 2 - Symptomatic, <50% confined to bed  .BP (!) 146/79   Pulse 76   Temp (!) 97.5 F (36.4 C)   Resp 18   Wt 200 lb 11.2 oz (91 kg)   SpO2 100%   BMI 28.80 kg/m  .Marland Kitchen GENERAL:alert, in no acute distress and comfortable SKIN: no acute rashes, no significant lesions EYES: conjunctiva are pink and non-injected, sclera anicteric OROPHARYNX: MMM, no exudates, no oropharyngeal erythema or  ulceration NECK: supple, no JVD LYMPH:  no palpable lymphadenopathy in the cervical, axillary or inguinal regions LUNGS: clear to auscultation b/l with normal respiratory effort HEART: regular rate & rhythm ABDOMEN:  normoactive bowel sounds , non tender, not distended. Extremity: no pedal edema PSYCH: alert & oriented x 3 with fluent speech NEURO: no focal motor/sensory deficits    LABORATORY DATA:  I have reviewed the data as listed  CBC Latest Ref Rng & Units 03/27/2021 03/13/2021 02/27/2021  WBC 4.0 - 10.5 K/uL 4.8 4.9 6.0  Hemoglobin 13.0 - 17.0 g/dL 11.4(L) 11.4(L) 11.7(L)  Hematocrit 39.0 - 52.0 % 33.8(L) 34.3(L) 35.3(L)  Platelets 150 - 400 K/uL 227 190 202    CMP Latest Ref Rng & Units 03/27/2021 03/13/2021 02/27/2021  Glucose 70 - 99 mg/dL 103(H) 94 117(H)  BUN 8 - 23 mg/dL _0 Creatinine 0.61 - 1.24 mg/dL 0.88 0.91 1.08  Sodium 135 - 145 mmol/L 135 132(L) 134(L)  Potassium 3.5 - 5.1 mmol/L 4.1 4.0 4.2  Chloride 98 - 111 mmol/L 102 98 98  CO2 22 - 32 mmol/L _1 Calcium 8.9 - 10.3 mg/dL 8.8(L) 8.4(L) 9.4  Total Protein 6.5 - 8.1 g/dL 6.6 6.7 7.7  Total Bilirubin 0.3 - 1.2 mg/dL 0.7 0.6 0.5  Alkaline Phos 38 - 126 U/L 92 78 85  AST 15 - 41 U/L 12(L) 13(L) 18  ALT 0 - 44 U/L _2 04/17/18 BM Bx:  RADIOGRAPHIC STUDIES: I have personally reviewed the radiological images as listed and agreed with the findings in the report. CUP PACEART INCLINIC DEVICE CHECK  Result Date: 03/12/2021 Pacemaker check in clinic. Normal device function. Thresholds, sensing, impedances consistent with previous measurements. Device programmed to maximize longevity.one mode switch, nohigh ventricular rates noted. Device programmed at appropriate safety margins. Histogram distribution appropriate for patient activity level. Device programmed to optimize intrinsic conduction. Estimated longevity 4 years___. Patient enrolled in remote follow-up. Patient education completed.  PDF did not transfer, see scanned report/office note  CUP PACEART REMOTE DEVICE CHECK  Result Date: 04/04/2021 Scheduled remote reviewed. Normal device function.  Next remote 91 days. LR   ASSESSMENT & PLAN:  85 y.o. male with  1. Lytic bone lesions - w/u consistent with newly diagnosed multiple myeloma Labs upon initial presentation from 04/14/18; hypercalcemic with Calcium at 13.8, renal function up form baseline with Creatinine at 1.48, HGB at 11.3. PSA was normal.   03/31/18 CT C/A/P revealed 1. Lytic lesion throughout all visualized vertebra from the lower cervical spine through the sacrum. Epidural extension of tumor T2, T3 and L2 level. Right L3-4 foraminal extension tumor. 2. Lytic lesions involving majority of ribs. Multiple pathologic rib fractures with bony expansion/sub pleural extension of tumor at several levels. 3. Lytic lesions of the scapula, hips bilaterally and pelvis bilaterally. With further progression of tumor, patient may be at risk for pathologic fractures. Pathologic fracture right clavicle incompletely assessed. 4. Circumferential narrowing sigmoid colon. Question possibility of underlying mass (series 2, image 97). Alternatively this could represent muscular hypertrophy/peristalsis. 5. Gastric antrum circumferential narrowing. Cannot exclude mass although this may be related to peristalsis. 6. No primary lung malignancy noted. 7. Matted aortic pulmonary window lymph nodes. 8. Circumferential urinary bladder wall thickening greater anterior dome region. Right lateral bladder diverticulum. 9. 5 mm low-density lesion pancreatic body (series 5, image 10) unchanged. This is felt unlikely to be a primary malignancy. 10. Right parapelvic 2.2 cm cyst without significant change. Scattered low-density renal lesions bilaterally too small to adequately characterize. Some were present previously. Statistically these are likely cysts. 11. Stable adrenal gland hyperplasia. 12. Gynecomastia.  13. Prostate gland causes slight impression upon the bladder base. 14.  Aortic Atherosclerosis. 15. Sequential pacemaker in place. 16. Gallstones.   04/23/18 PET/CT revealed Innumerable hypermetabolic lytic lesions throughout the skeleton especially involving the spine, ribs, bony pelvis along with some involvement of both proximal femurs. The appearance is compatible with pathologic diagnosis of plasma cell neoplasm could well reflect multiple myeloma. Resulting pathologic fractures of the right lateral clavicle and of multiple bilateral ribs. Lesions are present along the cortical margins of the spinal canal. 2. Both the area and the stomach in the area in the sigmoid colon drawn attention to on prior CT scan appear benign normal today. 3. The confluence of AP window lymph nodes measures about 1.2 cm in short axis with maximum SUV 3.1, which is mildly above the blood pool merit surveillance. 4. Other imaging findings of potential clinical significance: Aortic Atherosclerosis. Coronary atherosclerosis. Pacemaker noted. Borderline prostatomegaly. Cholelithiasis.  04/21/18 BM Bx revealed Normocellular bone marrow with Plasma Cell neoplasm. Scattered medium sized clusters of kappa-restricted plasma cells (14% aspirate, 10% CD138 immunohistochemistry.   I do suspect that the burden of disease is underestimated in the bone marrow sample given PET/CT findings of innumerable lytic lesions and an M spike of 3.4g   06/08/18 DG Hips bilat which revealed Innumerable lucencies are noted throughout the pelvis and proximal femurs,  similar findings noted on prior PET-CT of 04/23/2018. Findings are consistent patient's known multiple myeloma. Femoral necks are intact. 2.  Aortoiliac atherosclerotic vascular disease.  2. Pathologic right clavicle fracture due to myeloma- resolved 3. Hypercalcemia- resolved.   PLAN:    -Discussed pt labwork today, 03/27/2021 CBC and CMP stable -myeloma panel M spike decreased from to  0.6+0.4 equal to 1 g/dL to 0.3+0.1g/dl - 0.4g/dl -PET CT scan as noted above show overt myeloma progression with new bone lesions. --Discussed and plan to have Revlimid and adjust the daratumumab to every 2 weeks to try to optimize systemic treatment of his myeloma. --Continue OTC Vitamin D. -Hold Xgeva at this time until dental issues are resolved - Discussed with Dr Hardie Shackleton --patients dental surgeon. -Prescription provided for as needed tramadol to help with his bone pains related to myeloma. FOLLOW UP: Please schedule for daratumumab every 2 weeks for 6 doses with port flush and labs Xgeva still on hold due to dental procedure MD visit in 4 weeks   . The total time spent in the appointment was 30 minutes and more than 50% was on counseling and direct patient cares.  All of the patient's questions were answered with apparent satisfaction. The patient knows to call the clinic with any problems, questions or concerns.    Sullivan Lone MD [place] AAHIVMS [facility] Hematology/Oncology Physician [facility]  (Office):       [phone] (Work cell):  [phone] (Fax):           [phone]

## 2021-04-10 ENCOUNTER — Other Ambulatory Visit: Payer: Self-pay | Admitting: Hematology

## 2021-04-10 ENCOUNTER — Ambulatory Visit: Payer: Medicare Other

## 2021-04-10 ENCOUNTER — Ambulatory Visit
Admission: RE | Admit: 2021-04-10 | Discharge: 2021-04-10 | Disposition: A | Discharge status: Home / Self Care | Payer: Medicare Other | Source: Ambulatory Visit | Attending: Radiation Oncology | Admitting: Radiation Oncology

## 2021-04-10 ENCOUNTER — Inpatient Hospital Stay: Payer: Medicare Other

## 2021-04-10 VITALS — BP 156/83 | HR 62 | Temp 97.7°F | Resp 17 | Wt 200.5 lb

## 2021-04-10 DIAGNOSIS — Z7189 Other specified counseling: Secondary | ICD-10-CM

## 2021-04-10 DIAGNOSIS — C9001 Multiple myeloma in remission: Secondary | ICD-10-CM

## 2021-04-10 DIAGNOSIS — Z5112 Encounter for antineoplastic immunotherapy: Secondary | ICD-10-CM

## 2021-04-10 DIAGNOSIS — Z510 Encounter for antineoplastic radiation therapy: Secondary | ICD-10-CM | POA: Diagnosis not present

## 2021-04-10 LAB — CBC WITH DIFFERENTIAL/PLATELET
Abs Immature Granulocytes: 0.01 10*3/uL (ref 0.00–0.07)
Basophils Absolute: 0.1 10*3/uL (ref 0.0–0.1)
Basophils Relative: 2 %
Eosinophils Absolute: 0.8 10*3/uL — ABNORMAL HIGH (ref 0.0–0.5)
Eosinophils Relative: 23 %
HCT: 32.8 % — ABNORMAL LOW (ref 39.0–52.0)
Hemoglobin: 11.2 g/dL — ABNORMAL LOW (ref 13.0–17.0)
Immature Granulocytes: 0 %
Lymphocytes Relative: 12 %
Lymphs Abs: 0.4 10*3/uL — ABNORMAL LOW (ref 0.7–4.0)
MCH: 27.9 pg (ref 26.0–34.0)
MCHC: 34.1 g/dL (ref 30.0–36.0)
MCV: 81.8 fL (ref 80.0–100.0)
Monocytes Absolute: 0.6 10*3/uL (ref 0.1–1.0)
Monocytes Relative: 17 %
Neutro Abs: 1.5 10*3/uL — ABNORMAL LOW (ref 1.7–7.7)
Neutrophils Relative %: 46 %
Platelets: 78 10*3/uL — ABNORMAL LOW (ref 150–400)
RBC: 4.01 MIL/uL — ABNORMAL LOW (ref 4.22–5.81)
RDW: 15 % (ref 11.5–15.5)
WBC: 3.3 10*3/uL — ABNORMAL LOW (ref 4.0–10.5)
nRBC: 0 % (ref 0.0–0.2)

## 2021-04-10 LAB — CMP (CANCER CENTER ONLY)
ALT: 11 U/L (ref 0–44)
AST: 16 U/L (ref 15–41)
Albumin: 3.3 g/dL — ABNORMAL LOW (ref 3.5–5.0)
Alkaline Phosphatase: 70 U/L (ref 38–126)
Anion gap: 6 (ref 5–15)
BUN: 14 mg/dL (ref 8–23)
CO2: 25 mmol/L (ref 22–32)
Calcium: 8.4 mg/dL — ABNORMAL LOW (ref 8.9–10.3)
Chloride: 103 mmol/L (ref 98–111)
Creatinine: 0.98 mg/dL (ref 0.61–1.24)
GFR, Estimated: >60 mL/min (ref >60)
Glucose, Bld: 120 mg/dL — ABNORMAL HIGH (ref 70–99)
Potassium: 3.8 mmol/L (ref 3.5–5.1)
Sodium: 134 mmol/L — ABNORMAL LOW (ref 135–145)
Total Bilirubin: 0.7 mg/dL (ref 0.3–1.2)
Total Protein: 6.1 g/dL — ABNORMAL LOW (ref 6.5–8.1)

## 2021-04-10 MED ORDER — ACETAMINOPHEN 325 MG PO TABS
650.0000 mg | ORAL_TABLET | Freq: Once | ORAL | Status: AC
  Administered 2021-04-10: 650 mg via ORAL

## 2021-04-10 MED ORDER — DIPHENHYDRAMINE HCL 25 MG PO CAPS
50.0000 mg | ORAL_CAPSULE | Freq: Once | ORAL | Status: AC
  Administered 2021-04-10: 50 mg via ORAL

## 2021-04-10 MED ORDER — FAMOTIDINE 20 MG IN NS 100 ML IVPB
20.0000 mg | Freq: Once | INTRAVENOUS | Status: AC
  Administered 2021-04-10: 20 mg via INTRAVENOUS

## 2021-04-10 MED ORDER — SODIUM CHLORIDE 0.9 % IV SOLN
Freq: Once | INTRAVENOUS | Status: AC
Start: 2021-04-10 — End: 2021-04-10

## 2021-04-10 MED ORDER — SODIUM CHLORIDE 0.9 % IV SOLN
20.0000 mg | Freq: Once | INTRAVENOUS | Status: AC
  Administered 2021-04-10: 20 mg via INTRAVENOUS
  Filled 2021-04-10: qty 20

## 2021-04-10 MED ORDER — SODIUM CHLORIDE 0.9 % IV SOLN
16.0000 mg/kg | Freq: Once | INTRAVENOUS | Status: AC
  Administered 2021-04-10: 1600 mg via INTRAVENOUS
  Filled 2021-04-10: qty 80

## 2021-04-10 MED ORDER — MONTELUKAST SODIUM 10 MG PO TABS
10.0000 mg | ORAL_TABLET | Freq: Once | ORAL | Status: AC
  Administered 2021-04-10: 10 mg via ORAL

## 2021-04-10 NOTE — Progress Notes (Signed)
OK to trt w/ Plts 78 per MD

## 2021-04-10 NOTE — Patient Instructions (Signed)
[facility] MEDICAL ONCOLOGY  Discharge Instructions: °Thank you for choosing [facility] to provide your oncology and hematology care.  ° °If you have a lab appointment with the Cancer Center, please go directly to the Cancer Center and check in at the registration area. °  °Wear comfortable clothing and clothing appropriate for easy access to any Portacath or PICC line.  ° °We strive to give you quality time with your provider. You may need to reschedule your appointment if you arrive late (15 or more minutes).  Arriving late affects you and other patients whose appointments are after yours.  Also, if you miss three or more appointments without notifying the office, you may be dismissed from the clinic at the provider’s discretion.    °  °For prescription refill requests, have your pharmacy contact our office and allow 72 hours for refills to be completed.   ° °Today you received the following chemotherapy and/or immunotherapy agents: Darzalex  °  °To help prevent nausea and vomiting after your treatment, we encourage you to take your nausea medication as directed. ° °BELOW ARE SYMPTOMS THAT SHOULD BE REPORTED IMMEDIATELY: °*FEVER GREATER THAN 100.4 F (38 °C) OR HIGHER °*CHILLS OR SWEATING °*NAUSEA AND VOMITING THAT IS NOT CONTROLLED WITH YOUR NAUSEA MEDICATION °*UNUSUAL SHORTNESS OF BREATH °*UNUSUAL BRUISING OR BLEEDING °*URINARY PROBLEMS (pain or burning when urinating, or frequent urination) °*BOWEL PROBLEMS (unusual diarrhea, constipation, pain near the anus) °TENDERNESS IN MOUTH AND THROAT WITH OR WITHOUT PRESENCE OF ULCERS (sore throat, sores in mouth, or a toothache) °UNUSUAL RASH, SWELLING OR PAIN  °UNUSUAL VAGINAL DISCHARGE OR ITCHING  ° °Items with * indicate a potential emergency and should be followed up as soon as possible or go to the Emergency Department if any problems should occur. ° °Please show the CHEMOTHERAPY ALERT CARD or IMMUNOTHERAPY ALERT CARD at check-in to the  Emergency Department and triage nurse. ° °Should you have questions after your visit or need to cancel or reschedule your appointment, please contact [facility] MEDICAL ONCOLOG[facility]  Dept: [phone]  and follow the prompts.  Office hours are 8:00 a.m. to 4:30 p.m. Monday - Friday. Please note that voicemails left after 4:00 p.m. may not be returned until the following business day.  We are closed weekends and major holidays. You have access to a nurse at all times for urgent questions. Please call the main number to the clinic Dept: [phone] and follow the prompts. ° ° °For any non-urgent questions, you may also contact your provider using MyChart. We now offer e-Visits for anyone 18 and older to request care online for non-urgent symptoms. For details visit [email or web address]. °  °Also download the MyChart app! Go to the app store, search "MyChart", open the app, select [facility], and log in with your MyChart username and password. ° °Due to Covid, a mask is required upon entering the hospital/clinic. If you do not have a mask, one will be given to you upon arrival. For doctor visits, patients may have 1 support person aged 18 or older with them. For treatment visits, patients cannot have anyone with them due to current Covid guidelines and our immunocompromised population.  ° °

## 2021-04-11 ENCOUNTER — Encounter: Payer: Self-pay | Admitting: Radiation Oncology

## 2021-04-11 ENCOUNTER — Ambulatory Visit
Admission: RE | Admit: 2021-04-11 | Discharge: 2021-04-11 | Disposition: A | Discharge status: Home / Self Care | Payer: Medicare Other | Source: Ambulatory Visit | Attending: Radiation Oncology | Admitting: Radiation Oncology

## 2021-04-11 ENCOUNTER — Other Ambulatory Visit: Payer: Self-pay

## 2021-04-11 DIAGNOSIS — Z510 Encounter for antineoplastic radiation therapy: Secondary | ICD-10-CM | POA: Diagnosis not present

## 2021-04-12 ENCOUNTER — Encounter: Payer: Self-pay | Admitting: Hematology

## 2021-04-12 NOTE — Progress Notes (Signed)
Patient Name: Jonathon [name] MRN: [id number] DOB: 1935-12-16 Referring Physician: Sullivan Lone (Profile Not Attached) Date of Service: 04/11/2021 [facility] [place], [place]                                                        End Of Treatment Note  Diagnoses: C90.00-Multiple myeloma not having achieved remission  Cancer Staging: Multiple myeloma  Intent: Palliative  Radiation Treatment Dates: 03/26/2021 through 04/11/2021 Site Technique Total Dose (Gy) Dose per Fx (Gy) Completed Fx Beam Energies  Thorax: Chest_left chest wall at level of axilla Complex 30/30 3 10/10 6X  Sclav-RT: SCV_Rt including scapula Complex 30/30 3 10/10 6X  Sacrum: Spine_left including SI joint Complex 30/30 3 10/10 6X, 10X   Narrative: The patient tolerated radiation therapy relatively well.   Plan: The patient will receive a call in about one month from the radiation oncology department. He will continue follow up with Dr. Irene Limbo as well.   ________________________________________________    Carola Rhine, [facility]

## 2021-04-16 LAB — MULTIPLE MYELOMA PANEL, SERUM
Albumin SerPl Elph-Mcnc: 3.1 g/dL (ref 2.9–4.4)
Albumin/Glob SerPl: 1.3 (ref 0.7–1.7)
Alpha 1: 0.3 g/dL (ref 0.0–0.4)
Alpha2 Glob SerPl Elph-Mcnc: 0.8 g/dL (ref 0.4–1.0)
B-Globulin SerPl Elph-Mcnc: 0.9 g/dL (ref 0.7–1.3)
Gamma Glob SerPl Elph-Mcnc: 0.5 g/dL (ref 0.4–1.8)
Globulin, Total: 2.5 g/dL (ref 2.2–3.9)
IgA: 196 mg/dL (ref 61–437)
IgG (Immunoglobin G), Serum: 586 mg/dL — ABNORMAL LOW (ref 603–1613)
IgM (Immunoglobulin M), Srm: 35 mg/dL (ref 15–143)
M Protein SerPl Elph-Mcnc: 0.2 g/dL — ABNORMAL HIGH
Total Protein ELP: 5.6 g/dL — ABNORMAL LOW (ref 6.0–8.5)

## 2021-04-16 NOTE — Progress Notes (Signed)
Remote pacemaker transmission.   

## 2021-04-19 ENCOUNTER — Other Ambulatory Visit: Payer: Self-pay | Admitting: Hematology

## 2021-04-19 DIAGNOSIS — C9000 Multiple myeloma not having achieved remission: Secondary | ICD-10-CM

## 2021-04-24 ENCOUNTER — Ambulatory Visit: Payer: Medicare Other

## 2021-04-24 ENCOUNTER — Other Ambulatory Visit: Payer: Self-pay

## 2021-04-24 ENCOUNTER — Inpatient Hospital Stay (HOSPITAL_BASED_OUTPATIENT_CLINIC_OR_DEPARTMENT_OTHER): Payer: Medicare Other | Admitting: Hematology

## 2021-04-24 ENCOUNTER — Other Ambulatory Visit: Payer: Medicare Other

## 2021-04-24 ENCOUNTER — Inpatient Hospital Stay: Payer: Medicare Other

## 2021-04-24 ENCOUNTER — Inpatient Hospital Stay: Payer: Medicare Other | Attending: Hematology

## 2021-04-24 VITALS — BP 151/73 | HR 60 | Temp 97.7°F | Resp 18

## 2021-04-24 VITALS — BP 160/82 | HR 70 | Temp 97.5°F | Wt 203.1 lb

## 2021-04-24 DIAGNOSIS — Z8041 Family history of malignant neoplasm of ovary: Secondary | ICD-10-CM | POA: Insufficient documentation

## 2021-04-24 DIAGNOSIS — D72819 Decreased white blood cell count, unspecified: Secondary | ICD-10-CM | POA: Insufficient documentation

## 2021-04-24 DIAGNOSIS — C7951 Secondary malignant neoplasm of bone: Secondary | ICD-10-CM

## 2021-04-24 DIAGNOSIS — Z5112 Encounter for antineoplastic immunotherapy: Secondary | ICD-10-CM | POA: Insufficient documentation

## 2021-04-24 DIAGNOSIS — Z803 Family history of malignant neoplasm of breast: Secondary | ICD-10-CM | POA: Insufficient documentation

## 2021-04-24 DIAGNOSIS — C9000 Multiple myeloma not having achieved remission: Secondary | ICD-10-CM | POA: Diagnosis not present

## 2021-04-24 DIAGNOSIS — Z87891 Personal history of nicotine dependence: Secondary | ICD-10-CM | POA: Diagnosis not present

## 2021-04-24 DIAGNOSIS — I10 Essential (primary) hypertension: Secondary | ICD-10-CM | POA: Diagnosis not present

## 2021-04-24 DIAGNOSIS — D6481 Anemia due to antineoplastic chemotherapy: Secondary | ICD-10-CM | POA: Insufficient documentation

## 2021-04-24 DIAGNOSIS — Z7189 Other specified counseling: Secondary | ICD-10-CM

## 2021-04-24 DIAGNOSIS — D696 Thrombocytopenia, unspecified: Secondary | ICD-10-CM | POA: Insufficient documentation

## 2021-04-24 DIAGNOSIS — C9002 Multiple myeloma in relapse: Secondary | ICD-10-CM | POA: Diagnosis not present

## 2021-04-24 DIAGNOSIS — T451X5A Adverse effect of antineoplastic and immunosuppressive drugs, initial encounter: Secondary | ICD-10-CM | POA: Diagnosis not present

## 2021-04-24 DIAGNOSIS — C9001 Multiple myeloma in remission: Secondary | ICD-10-CM

## 2021-04-24 LAB — CBC WITH DIFFERENTIAL/PLATELET
Abs Immature Granulocytes: 0 10*3/uL (ref 0.00–0.07)
Basophils Absolute: 0.1 10*3/uL (ref 0.0–0.1)
Basophils Relative: 2 %
Eosinophils Absolute: 0.6 10*3/uL — ABNORMAL HIGH (ref 0.0–0.5)
Eosinophils Relative: 19 %
HCT: 33 % — ABNORMAL LOW (ref 39.0–52.0)
Hemoglobin: 10.8 g/dL — ABNORMAL LOW (ref 13.0–17.0)
Immature Granulocytes: 0 %
Lymphocytes Relative: 18 %
Lymphs Abs: 0.5 10*3/uL — ABNORMAL LOW (ref 0.7–4.0)
MCH: 27.6 pg (ref 26.0–34.0)
MCHC: 32.7 g/dL (ref 30.0–36.0)
MCV: 84.4 fL (ref 80.0–100.0)
Monocytes Absolute: 0.3 10*3/uL (ref 0.1–1.0)
Monocytes Relative: 11 %
Neutro Abs: 1.4 10*3/uL — ABNORMAL LOW (ref 1.7–7.7)
Neutrophils Relative %: 50 %
Platelets: 74 10*3/uL — ABNORMAL LOW (ref 150–400)
RBC: 3.91 MIL/uL — ABNORMAL LOW (ref 4.22–5.81)
RDW: 15.9 % — ABNORMAL HIGH (ref 11.5–15.5)
WBC: 2.8 10*3/uL — ABNORMAL LOW (ref 4.0–10.5)
nRBC: 0 % (ref 0.0–0.2)

## 2021-04-24 LAB — CMP (CANCER CENTER ONLY)
ALT: 9 U/L (ref 0–44)
AST: 12 U/L — ABNORMAL LOW (ref 15–41)
Albumin: 3.2 g/dL — ABNORMAL LOW (ref 3.5–5.0)
Alkaline Phosphatase: 82 U/L (ref 38–126)
Anion gap: 8 (ref 5–15)
BUN: 16 mg/dL (ref 8–23)
CO2: 25 mmol/L (ref 22–32)
Calcium: 8.5 mg/dL — ABNORMAL LOW (ref 8.9–10.3)
Chloride: 108 mmol/L (ref 98–111)
Creatinine: 0.88 mg/dL (ref 0.61–1.24)
GFR, Estimated: >60 mL/min (ref >60)
Glucose, Bld: 111 mg/dL — ABNORMAL HIGH (ref 70–99)
Potassium: 3.8 mmol/L (ref 3.5–5.1)
Sodium: 141 mmol/L (ref 135–145)
Total Bilirubin: 0.7 mg/dL (ref 0.3–1.2)
Total Protein: 6.3 g/dL — ABNORMAL LOW (ref 6.5–8.1)

## 2021-04-24 MED ORDER — SODIUM CHLORIDE 0.9 % IV SOLN: Freq: Once | INTRAVENOUS | Status: AC

## 2021-04-24 MED ORDER — MONTELUKAST SODIUM 10 MG PO TABS
10.0000 mg | ORAL_TABLET | Freq: Once | ORAL | Status: AC
  Administered 2021-04-24: 10 mg via ORAL
  Filled 2021-04-24: qty 1

## 2021-04-24 MED ORDER — ACETAMINOPHEN 325 MG PO TABS
650.0000 mg | ORAL_TABLET | Freq: Once | ORAL | Status: AC
  Administered 2021-04-24: 650 mg via ORAL
  Filled 2021-04-24: qty 2

## 2021-04-24 MED ORDER — SODIUM CHLORIDE 0.9 % IV SOLN
16.0000 mg/kg | Freq: Once | INTRAVENOUS | Status: AC
  Administered 2021-04-24: 1600 mg via INTRAVENOUS
  Filled 2021-04-24: qty 80

## 2021-04-24 MED ORDER — FAMOTIDINE 20 MG IN NS 100 ML IVPB
20.0000 mg | Freq: Once | INTRAVENOUS | Status: AC
  Administered 2021-04-24: 20 mg via INTRAVENOUS
  Filled 2021-04-24: qty 100

## 2021-04-24 MED ORDER — SODIUM CHLORIDE 0.9 % IV SOLN
20.0000 mg | Freq: Once | INTRAVENOUS | Status: AC
  Administered 2021-04-24: 20 mg via INTRAVENOUS
  Filled 2021-04-24: qty 20

## 2021-04-24 MED ORDER — DIPHENHYDRAMINE HCL 25 MG PO CAPS
50.0000 mg | ORAL_CAPSULE | Freq: Once | ORAL | Status: AC
  Administered 2021-04-24: 50 mg via ORAL
  Filled 2021-04-24: qty 2

## 2021-04-24 NOTE — Progress Notes (Signed)
Dr Irene Limbo aware of labs results today. OK to tx with current Plts and ANC.

## 2021-04-24 NOTE — Patient Instructions (Signed)
[facility] MEDICAL ONCOLOGY  Discharge Instructions: °Thank you for choosing [facility] to provide your oncology and hematology care.  ° °If you have a lab appointment with the Cancer Center, please go directly to the Cancer Center and check in at the registration area. °  °Wear comfortable clothing and clothing appropriate for easy access to any Portacath or PICC line.  ° °We strive to give you quality time with your provider. You may need to reschedule your appointment if you arrive late (15 or more minutes).  Arriving late affects you and other patients whose appointments are after yours.  Also, if you miss three or more appointments without notifying the office, you may be dismissed from the clinic at the provider’s discretion.    °  °For prescription refill requests, have your pharmacy contact our office and allow 72 hours for refills to be completed.   ° °Today you received the following chemotherapy and/or immunotherapy agents: Darzalex  °  °To help prevent nausea and vomiting after your treatment, we encourage you to take your nausea medication as directed. ° °BELOW ARE SYMPTOMS THAT SHOULD BE REPORTED IMMEDIATELY: °*FEVER GREATER THAN 100.4 F (38 °C) OR HIGHER °*CHILLS OR SWEATING °*NAUSEA AND VOMITING THAT IS NOT CONTROLLED WITH YOUR NAUSEA MEDICATION °*UNUSUAL SHORTNESS OF BREATH °*UNUSUAL BRUISING OR BLEEDING °*URINARY PROBLEMS (pain or burning when urinating, or frequent urination) °*BOWEL PROBLEMS (unusual diarrhea, constipation, pain near the anus) °TENDERNESS IN MOUTH AND THROAT WITH OR WITHOUT PRESENCE OF ULCERS (sore throat, sores in mouth, or a toothache) °UNUSUAL RASH, SWELLING OR PAIN  °UNUSUAL VAGINAL DISCHARGE OR ITCHING  ° °Items with * indicate a potential emergency and should be followed up as soon as possible or go to the Emergency Department if any problems should occur. ° °Please show the CHEMOTHERAPY ALERT CARD or IMMUNOTHERAPY ALERT CARD at check-in to the  Emergency Department and triage nurse. ° °Should you have questions after your visit or need to cancel or reschedule your appointment, please contact [facility]  Dept: [phone]  and follow the prompts.  Office hours are 8:00 a.m. to 4:30 p.m. Monday - Friday. Please note that voicemails left after 4:00 p.m. may not be returned until the following business day.  We are closed weekends and major holidays. You have access to a nurse at all times for urgent questions. Please call the main number to the clinic Dept: [phone] and follow the prompts. ° ° °For any non-urgent questions, you may also contact your provider using MyChart. We now offer e-Visits for anyone 18 and older to request care online for non-urgent symptoms. For details visit [email or web address]. °  °Also download the MyChart app! Go to the app store, search "MyChart", open the app, select [facility], and log in with your MyChart username and password. ° °Due to Covid, a mask is required upon entering the hospital/clinic. If you do not have a mask, one will be given to you upon arrival. For doctor visits, patients may have 1 support person aged 18 or older with them. For treatment visits, patients cannot have anyone with them due to current Covid guidelines and our immunocompromised population.  ° °

## 2021-04-25 ENCOUNTER — Telehealth: Payer: Self-pay | Admitting: Hematology

## 2021-04-25 NOTE — Telephone Encounter (Signed)
Left message with follow-up appointments per 12/13 los. °

## 2021-04-29 ENCOUNTER — Encounter: Payer: Self-pay | Admitting: Hematology

## 2021-04-29 NOTE — Progress Notes (Signed)
HEMATOLOGY/ONCOLOGY CLINIC NOTE  Date of Service: .04/24/2021   Patient Care Team: [name], MD as PCP - General (Family Medicine) [name], MD (Cardiology) [name], MD (Cardiology) [name], DO as Consulting Physician (Neurology)  CHIEF COMPLAINTS/PURPOSE OF CONSULTATION:  Follow-up for continued management of multiple myeloma  HISTORY OF PRESENTING ILLNESS:  Please see previous note for details on initial presentation.  INTERVAL HISTORY:   Jonathon [name] is here for continued evaluation and management of his multiple myeloma.  His last clinic visit with [place] was on 03/27/2021.  Patient reports no acute new symptoms.  His focal bone pains have resolved. He completed his radiation therapy on 04/11/2021.   Radiation Treatment Dates: 03/26/2021 through 04/11/2021 Site Technique Total Dose (Gy) Dose per Fx (Gy) Completed Fx Beam Energies  Thorax: Chest_left chest wall at level of axilla Complex 30/30 3 10/10 6X  Sclav-RT: SCV_Rt including scapula Complex 30/30 3 10/10 6X  Sacrum: Spine_left including SI joint        Patient reports no prohibitive toxicities from his daratumumab or Revlimid at this time. Grade 1 muscle cramps.  Lab results today 04/24/2021 shows hemoglobin of 10.8 with a WBC count of 2.8k ANC 1.4k, platelets of 74k CMP stable Myeloma panel from 04/10/2021 shows improvement in the M spike down to 0.2 g/dL    MEDICAL HISTORY:  Past Medical History:  Diagnosis Date   Asthma    BENIGN PROSTATIC HYPERTROPHY, HX OF 10/25/2008   Bone metastases ([place]) 04/14/2018   BRADYCARDIA 2005   Chronic diastolic congestive heart failure ([place]) 06/05/2016   CIDP (chronic inflammatory demyelinating polyneuropathy) ([place])    HYPERTENSION 11/21/2006   Iron deficiency anemia, unspecified 04/19/2013   Multiple myeloma ([place]) 04/29/2018   NEPHROLITHIASIS, HX OF 11/21/2006 and 10/15/2008   PACEMAKER, PERMANENT 2005   Gen change 2014 Medtronic Adaptic L  dual-chamber pacemaker, serial #[id number]    SVT (supraventricular tachycardia) ([place])    TOBACCO ABUSE 10/25/2008   Quit 2012    SURGICAL HISTORY: Past Surgical History:  Procedure Laterality Date   COLONOSCOPY  2004,2009   PACEMAKER INSERTION  05/14/2003   PERMANENT PACEMAKER GENERATOR CHANGE N/A 06/19/2012   Procedure: PERMANENT PACEMAKER GENERATOR CHANGE;  Surgeon: [name], MD; Medtronic Adaptic L dual-chamber pacemaker, serial #[id number]     SKIN GRAFT Right 05/13/1960   wrist   TOOTH EXTRACTION  11/10/2020   3 teeth removed    SOCIAL HISTORY: Social History   Socioeconomic History   Marital status: Divorced    Spouse name: Not on file   Number of children: 4   Years of education: Not on file   Highest education level: Not on file  Occupational History   Occupation: ENVIROMENTAL SERVICE    Employer: [employer]   Occupation: retired  Tobacco Use   Smoking status: Former    Packs/day: 0.25    Years: 46.00    Pack years: 11.50    Types: Cigarettes    Start date: 03/16/1965    Quit date: 06/15/2010    Years since quitting: 10.8   Smokeless tobacco: Never   Tobacco comments:    quit 3 years ago  Vaping Use   Vaping Use: Never used  Substance and Sexual Activity   Alcohol use: Yes    Comment: Occasional wine with dinner   Drug use: No   Sexual activity: Not on file  Other Topics Concern   Not on file  Social History Narrative  Has relocated from [place] in 2007. 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  Patient in a facility thru [facility]   Social Determinants of Health   Financial Resource Strain: Low Risk    Difficulty of Paying Living Expenses: Not hard at all  Food Insecurity: No Food Insecurity   Worried About Charity fundraiser in the Last Year: Never true   Arboriculturist in the Last Year: Never true  Transportation Needs: No Transportation Needs   Lack of Transportation (Medical): No   Lack of Transportation (Non-Medical): No  Physical Activity:  Not on file  Stress: Not on file  Social Connections: Unknown   Frequency of Communication with Friends and Family: More than three times a week   Frequency of Social Gatherings with Friends and Family: More than three times a week   Attends Religious Services: Not on Electrical engineer or Organizations: Not on file   Attends Archivist Meetings: Not on file   Marital Status: Not on file  Intimate Partner Violence: Not on file    FAMILY HISTORY: Family History  Problem Relation Age of Onset   Heart attack Father        Died, 61   Kidney disease Father    Parkinson's disease Mother        Died, 13   Breast cancer Sister        Living, 42   Diabetes type II Brother        Living, 76   Breast cancer Sister        Living, 69   Diabetes Daughter        Living, 25   Diabetes Son        Living, 61   Ovarian cancer Daughter    Colon cancer Neg Hx    Rectal cancer Neg Hx    Stomach cancer Neg Hx     ALLERGIES:  is allergic to lexapro [escitalopram oxalate].  MEDICATIONS:  Current Outpatient Medications  Medication Sig Dispense Refill   acyclovir (ZOVIRAX) 400 MG tablet Take 1 tablet (400 mg total) by mouth 2 (two) times daily. 60 tablet 11   amoxicillin (AMOXIL) 250 MG capsule Take 250 mg by mouth 3 (three) times daily.     aspirin EC 81 MG tablet Take 81 mg by mouth daily.     diltiazem (TIAZAC) 240 MG 24 hr capsule Take 1 capsule by mouth daily.     flecainide (TAMBOCOR) 50 MG tablet Take 75 mg by mouth daily as needed.     furosemide (LASIX) 40 MG tablet Take 40 mg by mouth daily.     irbesartan (AVAPRO) 300 MG tablet Take 300 mg by mouth daily.     memantine (NAMENDA) 5 MG tablet Take 5 mg by mouth 2 (two) times daily.     metoprolol tartrate (LOPRESSOR) 50 MG tablet Take 1 tablet (50 mg total) by mouth 2 (two) times daily. 60 tablet 0   ondansetron (ZOFRAN) 8 MG tablet Take 1 tablet (8 mg total) by mouth 2 (two) times daily as needed (Nausea or  vomiting). 30 tablet 1   polyethylene glycol (MIRALAX / GLYCOLAX) packet Take 17 g by mouth daily. 14 each 0   prochlorperazine (COMPAZINE) 10 MG tablet Take 1 tablet (10 mg total) by mouth every 6 (six) hours as needed (Nausea or vomiting). 30 tablet 1   REVLIMID 10 MG capsule TAKE 1 CAPSULE BY MOUTH ONCE DAILY FOR 21 DAYS ON AND 7 DAYS  OFF 21 capsule 0   Saccharomyces boulardii (FLORASTOR PO) Take 250 mg by mouth 2 (two) times daily.     tamsulosin (FLOMAX) 0.4 MG CAPS capsule Take 2 capsules (0.8 mg total) by mouth daily. 90 capsule 0   traMADol (ULTRAM) 50 MG tablet Take 1 tablet (50 mg total) by mouth every 6 (six) hours as needed. 30 tablet 0   No current facility-administered medications for this visit.   Facility-Administered Medications Ordered in Other Visits  Medication Dose Route Frequency Provider Last Rate Last Admin   [MAR Hold] sodium chloride flush (NS) 0.9 % injection 10 mL  10 mL Intracatheter PRN Brunetta Genera, MD        REVIEW OF SYSTEMS:   .10 Point review of Systems was done is negative except as noted above.  PHYSICAL EXAMINATION: ECOG PERFORMANCE STATUS: 2 - Symptomatic, <50% confined to bed  .BP (!) 160/82 (BP Location: Left Arm, Patient Position: Sitting)    Pulse 70    Temp (!) 97.5 F (36.4 C) (Oral)    Wt 203 lb 1 oz (92.1 kg)    SpO2 100%    BMI 29.14 kg/m  . GENERAL:alert, in no acute distress and comfortable SKIN: no acute rashes, no significant lesions EYES: conjunctiva are pink and non-injected, sclera anicteric OROPHARYNX: MMM, no exudates, no oropharyngeal erythema or ulceration NECK: supple, no JVD LYMPH:  no palpable lymphadenopathy in the cervical, axillary or inguinal regions LUNGS: clear to auscultation b/l with normal respiratory effort HEART: regular rate & rhythm ABDOMEN:  normoactive bowel sounds , non tender, not distended. Extremity: no pedal edema PSYCH: alert & oriented x 3 with fluent speech NEURO: no focal motor/sensory  deficits   LABORATORY DATA:  I have reviewed the data as listed  CBC Latest Ref Rng & Units 04/24/2021 04/10/2021 03/27/2021  WBC 4.0 - 10.5 K/uL 2.8(L) 3.3(L) 4.8  Hemoglobin 13.0 - 17.0 g/dL 10.8(L) 11.2(L) 11.4(L)  Hematocrit 39.0 - 52.0 % 33.0(L) 32.8(L) 33.8(L)  Platelets 150 - 400 K/uL 74(L) 78(L) 227    CMP Latest Ref Rng & Units 04/24/2021 04/10/2021 03/27/2021  Glucose 70 - 99 mg/dL 111(H) 120(H) 103(H)  BUN 8 - 23 mg/dL '16 14 11  ' Creatinine 0.61 - 1.24 mg/dL 0.88 0.98 0.88  Sodium 135 - 145 mmol/L 141 134(L) 135  Potassium 3.5 - 5.1 mmol/L 3.8 3.8 4.1  Chloride 98 - 111 mmol/L 108 103 102  CO2 22 - 32 mmol/L '25 25 23  ' Calcium 8.9 - 10.3 mg/dL 8.5(L) 8.4(L) 8.8(L)  Total Protein 6.5 - 8.1 g/dL 6.3(L) 6.1(L) 6.6  Total Bilirubin 0.3 - 1.2 mg/dL 0.7 0.7 0.7  Alkaline Phos 38 - 126 U/L 82 70 92  AST 15 - 41 U/L 12(L) 16 12(L)  ALT 0 - 44 U/L '9 11 8     ' 04/17/18 BM Bx:    RADIOGRAPHIC STUDIES: I have personally reviewed the radiological images as listed and agreed with the findings in the report. CUP PACEART REMOTE DEVICE CHECK  Result Date: 04/04/2021 Scheduled remote reviewed. Normal device function.  Next remote 91 days. LR   ASSESSMENT & PLAN:  85 y.o. male with  1. Lytic bone lesions - w/u consistent with newly diagnosed multiple myeloma Labs upon initial presentation from 04/14/18; hypercalcemic with Calcium at 13.8, renal function up form baseline with Creatinine at 1.48, HGB at 11.3. PSA was normal.   03/31/18 CT C/A/P revealed 1. Lytic lesion throughout all visualized vertebra from the lower cervical spine through the  sacrum. Epidural extension of tumor T2, T3 and L2 level. Right L3-4 foraminal extension tumor. 2. Lytic lesions involving majority of ribs. Multiple pathologic rib fractures with bony expansion/sub pleural extension of tumor at several levels. 3. Lytic lesions of the scapula, hips bilaterally and pelvis bilaterally. With further progression of  tumor, patient may be at risk for pathologic fractures. Pathologic fracture right clavicle incompletely assessed. 4. Circumferential narrowing sigmoid colon. Question possibility of underlying mass (series 2, image 97). Alternatively this could represent muscular hypertrophy/peristalsis. 5. Gastric antrum circumferential narrowing. Cannot exclude mass although this may be related to peristalsis. 6. No primary lung malignancy noted. 7. Matted aortic pulmonary window lymph nodes. 8. Circumferential urinary bladder wall thickening greater anterior dome region. Right lateral bladder diverticulum. 9. 5 mm low-density lesion pancreatic body (series 5, image 10) unchanged. This is felt unlikely to be a primary malignancy. 10. Right parapelvic 2.2 cm cyst without significant change. Scattered low-density renal lesions bilaterally too small to adequately characterize. Some were present previously. Statistically these are likely cysts. 11. Stable adrenal gland hyperplasia. 12. Gynecomastia. 13. Prostate gland causes slight impression upon the bladder base. 14.  Aortic Atherosclerosis. 15. Sequential pacemaker in place. 16. Gallstones.   04/23/18 PET/CT revealed Innumerable hypermetabolic lytic lesions throughout the skeleton especially involving the spine, ribs, bony pelvis along with some involvement of both proximal femurs. The appearance is compatible with pathologic diagnosis of plasma cell neoplasm could well reflect multiple myeloma. Resulting pathologic fractures of the right lateral clavicle and of multiple bilateral ribs. Lesions are present along the cortical margins of the spinal canal. 2. Both the area and the stomach in the area in the sigmoid colon drawn attention to on prior CT scan appear benign normal today. 3. The confluence of AP window lymph nodes measures about 1.2 cm in short axis with maximum SUV 3.1, which is mildly above the blood pool merit surveillance. 4. Other imaging findings of potential  clinical significance: Aortic Atherosclerosis. Coronary atherosclerosis. Pacemaker noted. Borderline prostatomegaly. Cholelithiasis.  04/21/18 BM Bx revealed Normocellular bone marrow with Plasma Cell neoplasm. Scattered medium sized clusters of kappa-restricted plasma cells (14% aspirate, 10% CD138 immunohistochemistry.   I do suspect that the burden of disease is underestimated in the bone marrow sample given PET/CT findings of innumerable lytic lesions and an M spike of 3.4g   06/08/18 DG Hips bilat which revealed Innumerable lucencies are noted throughout the pelvis and proximal femurs, similar findings noted on prior PET-CT of 04/23/2018. Findings are consistent patient's known multiple myeloma. Femoral necks are intact. 2.  Aortoiliac atherosclerotic vascular disease.  2. Pathologic right clavicle fracture due to myeloma- resolved 3. Hypercalcemia- resolved.   PLAN:    -Discussed pt labwork 04/24/2021 shows hemoglobin of 10.8 with a WBC count of 2.8k ANC 1.4k, platelets of 74k CMP stable Myeloma panel from 04/10/2021 shows improvement in the M spike down to 0.2 g/dL (Previously myeloma panel M spike decreased from to 0.6+0.4 equal to 1 g/dL to 0.3+0.1g/dl - 0.4g/dl) -Patient has completed his planned radiation therapy to his skeletal lesions with good pain control. -Continue daratumumab every 2 weeks. -Continue current dose of Revlimid at 10 mg, 3 weeks on 1 week off. -Patient has some mild anemia thrombocytopenia and leukopenia from Revlimid though he was at his end of his cycle and has a week of for his counts to improve.  We will continue to monitor his blood counts. -Continue to hold Xgeva at this time until his dental issues are resolved.-He follows with  Dr. Hardie Shackleton his dental surgeon. FOLLOW UP: Please schedule for daratumumab every 2 weeks for 6 doses with port flush and labs Xgeva still on hold due to dental procedure MD visit in 4 weeks   . The total time spent in the appointment  was 32 minutes review of his medical records, review of his labs with the patient, ordering and management of daratumumab and Revlimid chemotherapy, coordination of care with radiation oncology.   All of the patient's questions were answered with apparent satisfaction. The patient knows to call the clinic with any problems, questions or concerns.    Sullivan Lone MD [place] AAHIVMS [facility] Hematology/Oncology Physician [facility]

## 2021-04-30 LAB — MULTIPLE MYELOMA PANEL, SERUM
Albumin SerPl Elph-Mcnc: 3.1 g/dL (ref 2.9–4.4)
Albumin/Glob SerPl: 1.2 (ref 0.7–1.7)
Alpha 1: 0.3 g/dL (ref 0.0–0.4)
Alpha2 Glob SerPl Elph-Mcnc: 0.8 g/dL (ref 0.4–1.0)
B-Globulin SerPl Elph-Mcnc: 1 g/dL (ref 0.7–1.3)
Gamma Glob SerPl Elph-Mcnc: 0.6 g/dL (ref 0.4–1.8)
Globulin, Total: 2.6 g/dL (ref 2.2–3.9)
IgA: 168 mg/dL (ref 61–437)
IgG (Immunoglobin G), Serum: 641 mg/dL (ref 603–1613)
IgM (Immunoglobulin M), Srm: 34 mg/dL (ref 15–143)
M Protein SerPl Elph-Mcnc: 0.2 g/dL — ABNORMAL HIGH
Total Protein ELP: 5.7 g/dL — ABNORMAL LOW (ref 6.0–8.5)

## 2021-05-01 DIAGNOSIS — M79675 Pain in left toe(s): Secondary | ICD-10-CM | POA: Diagnosis not present

## 2021-05-01 DIAGNOSIS — M79674 Pain in right toe(s): Secondary | ICD-10-CM | POA: Diagnosis not present

## 2021-05-01 DIAGNOSIS — B351 Tinea unguium: Secondary | ICD-10-CM | POA: Diagnosis not present

## 2021-05-08 ENCOUNTER — Inpatient Hospital Stay: Payer: Medicare Other

## 2021-05-08 ENCOUNTER — Other Ambulatory Visit: Payer: Self-pay

## 2021-05-08 VITALS — BP 142/73 | HR 71 | Temp 98.9°F | Resp 20

## 2021-05-08 DIAGNOSIS — Z7189 Other specified counseling: Secondary | ICD-10-CM

## 2021-05-08 DIAGNOSIS — Z5112 Encounter for antineoplastic immunotherapy: Secondary | ICD-10-CM

## 2021-05-08 DIAGNOSIS — C9001 Multiple myeloma in remission: Secondary | ICD-10-CM

## 2021-05-08 LAB — CBC WITH DIFFERENTIAL/PLATELET
Abs Immature Granulocytes: 0.01 10*3/uL (ref 0.00–0.07)
Basophils Absolute: 0.1 10*3/uL (ref 0.0–0.1)
Basophils Relative: 2 %
Eosinophils Absolute: 0.4 10*3/uL (ref 0.0–0.5)
Eosinophils Relative: 13 %
HCT: 34.5 % — ABNORMAL LOW (ref 39.0–52.0)
Hemoglobin: 11.2 g/dL — ABNORMAL LOW (ref 13.0–17.0)
Immature Granulocytes: 0 %
Lymphocytes Relative: 35 %
Lymphs Abs: 1.2 10*3/uL (ref 0.7–4.0)
MCH: 27.7 pg (ref 26.0–34.0)
MCHC: 32.5 g/dL (ref 30.0–36.0)
MCV: 85.4 fL (ref 80.0–100.0)
Monocytes Absolute: 0.5 10*3/uL (ref 0.1–1.0)
Monocytes Relative: 15 %
Neutro Abs: 1.2 10*3/uL — ABNORMAL LOW (ref 1.7–7.7)
Neutrophils Relative %: 35 %
Platelets: 92 10*3/uL — ABNORMAL LOW (ref 150–400)
RBC: 4.04 MIL/uL — ABNORMAL LOW (ref 4.22–5.81)
RDW: 16.2 % — ABNORMAL HIGH (ref 11.5–15.5)
WBC: 3.3 10*3/uL — ABNORMAL LOW (ref 4.0–10.5)
nRBC: 0 % (ref 0.0–0.2)

## 2021-05-08 LAB — CMP (CANCER CENTER ONLY)
ALT: 9 U/L (ref 0–44)
AST: 13 U/L — ABNORMAL LOW (ref 15–41)
Albumin: 3.6 g/dL (ref 3.5–5.0)
Alkaline Phosphatase: 78 U/L (ref 38–126)
Anion gap: 7 (ref 5–15)
BUN: 10 mg/dL (ref 8–23)
CO2: 28 mmol/L (ref 22–32)
Calcium: 8.9 mg/dL (ref 8.9–10.3)
Chloride: 103 mmol/L (ref 98–111)
Creatinine: 0.96 mg/dL (ref 0.61–1.24)
GFR, Estimated: >60 mL/min (ref >60)
Glucose, Bld: 92 mg/dL (ref 70–99)
Potassium: 3.5 mmol/L (ref 3.5–5.1)
Sodium: 138 mmol/L (ref 135–145)
Total Bilirubin: 0.7 mg/dL (ref 0.3–1.2)
Total Protein: 6.1 g/dL — ABNORMAL LOW (ref 6.5–8.1)

## 2021-05-08 MED ORDER — FAMOTIDINE 20 MG IN NS 100 ML IVPB
20.0000 mg | Freq: Once | INTRAVENOUS | Status: AC
  Administered 2021-05-08: 13:00:00 20 mg via INTRAVENOUS
  Filled 2021-05-08: qty 100

## 2021-05-08 MED ORDER — SODIUM CHLORIDE 0.9 % IV SOLN
16.0000 mg/kg | Freq: Once | INTRAVENOUS | Status: AC
  Administered 2021-05-08: 15:00:00 1600 mg via INTRAVENOUS
  Filled 2021-05-08: qty 80

## 2021-05-08 MED ORDER — SODIUM CHLORIDE 0.9 % IV SOLN
20.0000 mg | Freq: Once | INTRAVENOUS | Status: AC
  Administered 2021-05-08: 13:00:00 20 mg via INTRAVENOUS
  Filled 2021-05-08: qty 20

## 2021-05-08 MED ORDER — MONTELUKAST SODIUM 10 MG PO TABS
10.0000 mg | ORAL_TABLET | Freq: Once | ORAL | Status: AC
  Administered 2021-05-08: 13:00:00 10 mg via ORAL
  Filled 2021-05-08: qty 1

## 2021-05-08 MED ORDER — DIPHENHYDRAMINE HCL 25 MG PO CAPS
50.0000 mg | ORAL_CAPSULE | Freq: Once | ORAL | Status: AC
  Administered 2021-05-08: 13:00:00 50 mg via ORAL
  Filled 2021-05-08: qty 2

## 2021-05-08 MED ORDER — SODIUM CHLORIDE 0.9 % IV SOLN: Freq: Once | INTRAVENOUS | Status: AC

## 2021-05-08 MED ORDER — ACETAMINOPHEN 325 MG PO TABS
650.0000 mg | ORAL_TABLET | Freq: Once | ORAL | Status: AC
  Administered 2021-05-08: 13:00:00 650 mg via ORAL
  Filled 2021-05-08: qty 2

## 2021-05-08 NOTE — Patient Instructions (Signed)
[facility]  Discharge Instructions: °Thank you for choosing [facility] to provide your oncology and hematology care.  ° °If you have a lab appointment with the Cancer Center, please go directly to the Cancer Center and check in at the registration area. °  °Wear comfortable clothing and clothing appropriate for easy access to any Portacath or PICC line.  ° °We strive to give you quality time with your provider. You may need to reschedule your appointment if you arrive late (15 or more minutes).  Arriving late affects you and other patients whose appointments are after yours.  Also, if you miss three or more appointments without notifying the office, you may be dismissed from the clinic at the provider’s discretion.    °  °For prescription refill requests, have your pharmacy contact our office and allow 72 hours for refills to be completed.   ° °Today you received the following chemotherapy and/or immunotherapy agents: Daratumumab.     °  °To help prevent nausea and vomiting after your treatment, we encourage you to take your nausea medication as directed. ° °BELOW ARE SYMPTOMS THAT SHOULD BE REPORTED IMMEDIATELY: °*FEVER GREATER THAN 100.4 F (38 °C) OR HIGHER °*CHILLS OR SWEATING °*NAUSEA AND VOMITING THAT IS NOT CONTROLLED WITH YOUR NAUSEA MEDICATION °*UNUSUAL SHORTNESS OF BREATH °*UNUSUAL BRUISING OR BLEEDING °*URINARY PROBLEMS (pain or burning when urinating, or frequent urination) °*BOWEL PROBLEMS (unusual diarrhea, constipation, pain near the anus) °TENDERNESS IN MOUTH AND THROAT WITH OR WITHOUT PRESENCE OF ULCERS (sore throat, sores in mouth, or a toothache) °UNUSUAL RASH, SWELLING OR PAIN  °UNUSUAL VAGINAL DISCHARGE OR ITCHING  ° °Items with * indicate a potential emergency and should be followed up as soon as possible or go to the Emergency Department if any problems should occur. ° °Please show the CHEMOTHERAPY ALERT CARD or IMMUNOTHERAPY ALERT CARD at check-in  to the Emergency Department and triage nurse. ° °Should you have questions after your visit or need to cancel or reschedule your appointment, please contact [facility]  Dept: [phone]  and follow the prompts.  Office hours are 8:00 a.m. to 4:30 p.m. Monday - Friday. Please note that voicemails left after 4:00 p.m. may not be returned until the following business day.  We are closed weekends and major holidays. You have access to a nurse at all times for urgent questions. Please call the main number to the clinic Dept: [phone] and follow the prompts. ° ° °For any non-urgent questions, you may also contact your provider using MyChart. We now offer e-Visits for anyone 18 and older to request care online for non-urgent symptoms. For details visit [email or web address]. °  °Also download the MyChart app! Go to the app store, search "MyChart", open the app, select [facility], and log in with your MyChart username and password. ° °Due to Covid, a mask is required upon entering the hospital/clinic. If you do not have a mask, one will be given to you upon arrival. For doctor visits, patients may have 1 support person aged 18 or older with them. For treatment visits, patients cannot have anyone with them due to current Covid guidelines and our immunocompromised population.  ° °

## 2021-05-11 LAB — MULTIPLE MYELOMA PANEL, SERUM
Albumin SerPl Elph-Mcnc: 3.3 g/dL (ref 2.9–4.4)
Albumin/Glob SerPl: 1.4 (ref 0.7–1.7)
Alpha 1: 0.2 g/dL (ref 0.0–0.4)
Alpha2 Glob SerPl Elph-Mcnc: 0.8 g/dL (ref 0.4–1.0)
B-Globulin SerPl Elph-Mcnc: 0.9 g/dL (ref 0.7–1.3)
Gamma Glob SerPl Elph-Mcnc: 0.5 g/dL (ref 0.4–1.8)
Globulin, Total: 2.4 g/dL (ref 2.2–3.9)
IgA: 147 mg/dL (ref 61–437)
IgG (Immunoglobin G), Serum: 621 mg/dL (ref 603–1613)
IgM (Immunoglobulin M), Srm: 45 mg/dL (ref 15–143)
M Protein SerPl Elph-Mcnc: 0.2 g/dL — ABNORMAL HIGH
Total Protein ELP: 5.7 g/dL — ABNORMAL LOW (ref 6.0–8.5)

## 2021-05-14 ENCOUNTER — Other Ambulatory Visit: Payer: Self-pay | Admitting: Hematology

## 2021-05-14 DIAGNOSIS — C9000 Multiple myeloma not having achieved remission: Secondary | ICD-10-CM

## 2021-05-15 ENCOUNTER — Other Ambulatory Visit: Payer: Self-pay

## 2021-05-15 ENCOUNTER — Encounter: Payer: Self-pay | Admitting: Hematology

## 2021-05-17 DIAGNOSIS — K91841 Postprocedural hemorrhage and hematoma of a digestive system organ or structure following other procedure: Secondary | ICD-10-CM | POA: Diagnosis not present

## 2021-05-17 DIAGNOSIS — M272 Inflammatory conditions of jaws: Secondary | ICD-10-CM | POA: Diagnosis not present

## 2021-05-17 DIAGNOSIS — M87180 Osteonecrosis due to drugs, jaw: Secondary | ICD-10-CM | POA: Diagnosis not present

## 2021-05-21 ENCOUNTER — Ambulatory Visit
Admission: RE | Admit: 2021-05-21 | Discharge: 2021-05-21 | Disposition: A | Discharge status: Home / Self Care | Payer: Medicare Other | Source: Ambulatory Visit | Attending: Radiation Oncology | Admitting: Radiation Oncology

## 2021-05-21 DIAGNOSIS — C9000 Multiple myeloma not having achieved remission: Secondary | ICD-10-CM

## 2021-05-21 NOTE — Progress Notes (Signed)
Radiation Oncology         [phone] ________________________________  Name: Jonathon [name] MRN: [id number]  Date of Service: 05/21/2021  DOB: Jan 13, 1936  Post Treatment Telephone Note  Diagnosis:   Multiple myeloma  Intent: Palliative  Radiation Treatment Dates: 03/26/2021 through 04/11/2021 Site Technique Total Dose (Gy) Dose per Fx (Gy) Completed Fx Beam Energies  Thorax: Chest_left chest wall at level of axilla Complex 30/30 3 10/10 6X  Sclav-RT: SCV_Rt including scapula Complex 30/30 3 10/10 6X  Sacrum: Spine_left including SI joint Complex 30/30 3 10/10 6X, 10X   The patient tolerated radiation therapy relatively well.   Impression/Plan: 1.  Multiple myeloma. The patient has been doing well since completion of radiotherapy. I was unable to reach the patient but left a voicemail on his daughters voicemail and on the message, I discussed that we would be happy to continue to follow him as needed, but he will also continue to follow up with Dr. Irene Limbo in medical oncology.      Carola Rhine, PAC

## 2021-05-22 ENCOUNTER — Other Ambulatory Visit: Payer: Self-pay

## 2021-05-22 ENCOUNTER — Inpatient Hospital Stay: Payer: Medicare Other | Attending: Hematology

## 2021-05-22 ENCOUNTER — Other Ambulatory Visit: Payer: Medicare Other

## 2021-05-22 ENCOUNTER — Inpatient Hospital Stay (HOSPITAL_BASED_OUTPATIENT_CLINIC_OR_DEPARTMENT_OTHER): Payer: Medicare Other | Admitting: Hematology

## 2021-05-22 ENCOUNTER — Inpatient Hospital Stay: Payer: Medicare Other

## 2021-05-22 VITALS — BP 169/85 | HR 60 | Temp 97.7°F | Resp 18 | Wt 203.0 lb

## 2021-05-22 DIAGNOSIS — C9002 Multiple myeloma in relapse: Secondary | ICD-10-CM | POA: Insufficient documentation

## 2021-05-22 DIAGNOSIS — C7951 Secondary malignant neoplasm of bone: Secondary | ICD-10-CM

## 2021-05-22 DIAGNOSIS — Z5112 Encounter for antineoplastic immunotherapy: Secondary | ICD-10-CM

## 2021-05-22 DIAGNOSIS — C9001 Multiple myeloma in remission: Secondary | ICD-10-CM

## 2021-05-22 DIAGNOSIS — Z7189 Other specified counseling: Secondary | ICD-10-CM

## 2021-05-22 LAB — CBC WITH DIFFERENTIAL/PLATELET
Abs Immature Granulocytes: 0.02 10*3/uL (ref 0.00–0.07)
Basophils Absolute: 0.1 10*3/uL (ref 0.0–0.1)
Basophils Relative: 1 %
Eosinophils Absolute: 0.4 10*3/uL (ref 0.0–0.5)
Eosinophils Relative: 8 %
HCT: 35.2 % — ABNORMAL LOW (ref 39.0–52.0)
Hemoglobin: 11.5 g/dL — ABNORMAL LOW (ref 13.0–17.0)
Immature Granulocytes: 1 %
Lymphocytes Relative: 44 %
Lymphs Abs: 1.8 10*3/uL (ref 0.7–4.0)
MCH: 27.8 pg (ref 26.0–34.0)
MCHC: 32.7 g/dL (ref 30.0–36.0)
MCV: 85 fL (ref 80.0–100.0)
Monocytes Absolute: 0.5 10*3/uL (ref 0.1–1.0)
Monocytes Relative: 11 %
Neutro Abs: 1.5 10*3/uL — ABNORMAL LOW (ref 1.7–7.7)
Neutrophils Relative %: 35 %
Platelets: 149 10*3/uL — ABNORMAL LOW (ref 150–400)
RBC: 4.14 MIL/uL — ABNORMAL LOW (ref 4.22–5.81)
RDW: 16.4 % — ABNORMAL HIGH (ref 11.5–15.5)
WBC: 4.2 10*3/uL (ref 4.0–10.5)
nRBC: 0 % (ref 0.0–0.2)

## 2021-05-22 LAB — CMP (CANCER CENTER ONLY)
ALT: 7 U/L (ref 0–44)
AST: 13 U/L — ABNORMAL LOW (ref 15–41)
Albumin: 3.7 g/dL (ref 3.5–5.0)
Alkaline Phosphatase: 88 U/L (ref 38–126)
Anion gap: 8 (ref 5–15)
BUN: 20 mg/dL (ref 8–23)
CO2: 27 mmol/L (ref 22–32)
Calcium: 9 mg/dL (ref 8.9–10.3)
Chloride: 106 mmol/L (ref 98–111)
Creatinine: 1.01 mg/dL (ref 0.61–1.24)
GFR, Estimated: >60 mL/min (ref >60)
Glucose, Bld: 96 mg/dL (ref 70–99)
Potassium: 3.7 mmol/L (ref 3.5–5.1)
Sodium: 141 mmol/L (ref 135–145)
Total Bilirubin: 0.6 mg/dL (ref 0.3–1.2)
Total Protein: 6.5 g/dL (ref 6.5–8.1)

## 2021-05-22 MED ORDER — ACETAMINOPHEN 325 MG PO TABS
650.0000 mg | ORAL_TABLET | Freq: Once | ORAL | Status: AC
  Administered 2021-05-22: 650 mg via ORAL
  Filled 2021-05-22: qty 2

## 2021-05-22 MED ORDER — FAMOTIDINE 20 MG IN NS 100 ML IVPB
20.0000 mg | Freq: Once | INTRAVENOUS | Status: AC
  Administered 2021-05-22: 20 mg via INTRAVENOUS
  Filled 2021-05-22: qty 100

## 2021-05-22 MED ORDER — DIPHENHYDRAMINE HCL 25 MG PO CAPS
50.0000 mg | ORAL_CAPSULE | Freq: Once | ORAL | Status: AC
  Administered 2021-05-22: 50 mg via ORAL
  Filled 2021-05-22: qty 2

## 2021-05-22 MED ORDER — MONTELUKAST SODIUM 10 MG PO TABS
10.0000 mg | ORAL_TABLET | Freq: Once | ORAL | Status: AC
  Administered 2021-05-22: 10 mg via ORAL
  Filled 2021-05-22: qty 1

## 2021-05-22 MED ORDER — SODIUM CHLORIDE 0.9 % IV SOLN
20.0000 mg | Freq: Once | INTRAVENOUS | Status: AC
  Administered 2021-05-22: 20 mg via INTRAVENOUS
  Filled 2021-05-22: qty 20

## 2021-05-22 MED ORDER — SODIUM CHLORIDE 0.9 % IV SOLN: Freq: Once | INTRAVENOUS | Status: AC

## 2021-05-22 MED ORDER — SODIUM CHLORIDE 0.9 % IV SOLN
16.0000 mg/kg | Freq: Once | INTRAVENOUS | Status: AC
  Administered 2021-05-22: 1600 mg via INTRAVENOUS
  Filled 2021-05-22: qty 80

## 2021-05-22 NOTE — Patient Instructions (Signed)
[facility]  Discharge Instructions: °Thank you for choosing [facility] to provide your oncology and hematology care.  ° °If you have a lab appointment with the Cancer Center, please go directly to the Cancer Center and check in at the registration area. °  °Wear comfortable clothing and clothing appropriate for easy access to any Portacath or PICC line.  ° °We strive to give you quality time with your provider. You may need to reschedule your appointment if you arrive late (15 or more minutes).  Arriving late affects you and other patients whose appointments are after yours.  Also, if you miss three or more appointments without notifying the office, you may be dismissed from the clinic at the provider’s discretion.    °  °For prescription refill requests, have your pharmacy contact our office and allow 72 hours for refills to be completed.   ° °Today you received the following chemotherapy and/or immunotherapy agents: Darzalex  °  °To help prevent nausea and vomiting after your treatment, we encourage you to take your nausea medication as directed. ° °BELOW ARE SYMPTOMS THAT SHOULD BE REPORTED IMMEDIATELY: °*FEVER GREATER THAN 100.4 F (38 °C) OR HIGHER °*CHILLS OR SWEATING °*NAUSEA AND VOMITING THAT IS NOT CONTROLLED WITH YOUR NAUSEA MEDICATION °*UNUSUAL SHORTNESS OF BREATH °*UNUSUAL BRUISING OR BLEEDING °*URINARY PROBLEMS (pain or burning when urinating, or frequent urination) °*BOWEL PROBLEMS (unusual diarrhea, constipation, pain near the anus) °TENDERNESS IN MOUTH AND THROAT WITH OR WITHOUT PRESENCE OF ULCERS (sore throat, sores in mouth, or a toothache) °UNUSUAL RASH, SWELLING OR PAIN  °UNUSUAL VAGINAL DISCHARGE OR ITCHING  ° °Items with * indicate a potential emergency and should be followed up as soon as possible or go to the Emergency Department if any problems should occur. ° °Please show the CHEMOTHERAPY ALERT CARD or IMMUNOTHERAPY ALERT CARD at check-in to the  Emergency Department and triage nurse. ° °Should you have questions after your visit or need to cancel or reschedule your appointment, please contact [facility]  Dept: [phone]  and follow the prompts.  Office hours are 8:00 a.m. to 4:30 p.m. Monday - Friday. Please note that voicemails left after 4:00 p.m. may not be returned until the following business day.  We are closed weekends and major holidays. You have access to a nurse at all times for urgent questions. Please call the main number to the clinic Dept: [phone] and follow the prompts. ° ° °For any non-urgent questions, you may also contact your provider using MyChart. We now offer e-Visits for anyone 18 and older to request care online for non-urgent symptoms. For details visit [email or web address]. °  °Also download the MyChart app! Go to the app store, search "MyChart", open the app, select [facility], and log in with your MyChart username and password. ° °Due to Covid, a mask is required upon entering the hospital/clinic. If you do not have a mask, one will be given to you upon arrival. For doctor visits, patients may have 1 support person aged 18 or older with them. For treatment visits, patients cannot have anyone with them due to current Covid guidelines and our immunocompromised population.  ° °

## 2021-05-23 ENCOUNTER — Telehealth: Payer: Self-pay | Admitting: Hematology

## 2021-05-23 NOTE — Telephone Encounter (Signed)
Scheduled follow-up appointments per 1/10 los. Patient's daughter is aware.

## 2021-05-28 ENCOUNTER — Other Ambulatory Visit: Payer: Self-pay

## 2021-05-28 ENCOUNTER — Encounter: Payer: Self-pay | Admitting: Hematology

## 2021-05-28 DIAGNOSIS — C9000 Multiple myeloma not having achieved remission: Secondary | ICD-10-CM

## 2021-05-28 LAB — MULTIPLE MYELOMA PANEL, SERUM
Albumin SerPl Elph-Mcnc: 3 g/dL (ref 2.9–4.4)
Albumin/Glob SerPl: 1 (ref 0.7–1.7)
Alpha 1: 0.3 g/dL (ref 0.0–0.4)
Alpha2 Glob SerPl Elph-Mcnc: 0.9 g/dL (ref 0.4–1.0)
B-Globulin SerPl Elph-Mcnc: 1.1 g/dL (ref 0.7–1.3)
Gamma Glob SerPl Elph-Mcnc: 0.8 g/dL (ref 0.4–1.8)
Globulin, Total: 3.1 g/dL (ref 2.2–3.9)
IgA: 169 mg/dL (ref 61–437)
IgG (Immunoglobin G), Serum: 725 mg/dL (ref 603–1613)
IgM (Immunoglobulin M), Srm: 91 mg/dL (ref 15–143)
Total Protein ELP: 6.1 g/dL (ref 6.0–8.5)

## 2021-05-28 MED ORDER — REVLIMID 10 MG PO CAPS: ORAL_CAPSULE | ORAL | 0 refills | Status: DC

## 2021-05-28 NOTE — Progress Notes (Addendum)
HEMATOLOGY/ONCOLOGY CLINIC NOTE  Date of Service: .05/22/2021   Patient Care Team: [name], MD as PCP - General (Family Medicine) [name], MD (Cardiology) [name], MD (Cardiology) [name], DO as Consulting Physician (Neurology)  CHIEF COMPLAINTS/PURPOSE OF CONSULTATION:  Follow-up for continued management of multiple myeloma  HISTORY OF PRESENTING ILLNESS:  Please see previous note for details on initial presentation.  INTERVAL HISTORY:   Jonathon [name] is here for follow-up for continued evaluation and management of multiple myeloma.  Patient notes no acute new symptoms since his last clinic visit about a month ago. He notes that most of his bone pain has resolved after radiation with his current treatment. He notes no notable toxicities from his daratumumab and Revlimid.  He still needs dental clearance to restart his Xgeva. Labs done today were reviewed.   MEDICAL HISTORY:  Past Medical History:  Diagnosis Date   Asthma    BENIGN PROSTATIC HYPERTROPHY, HX OF 10/25/2008   Bone metastases ([place]) 04/14/2018   BRADYCARDIA 2005   Chronic diastolic congestive heart failure ([place]) 06/05/2016   CIDP (chronic inflammatory demyelinating polyneuropathy) ([place])    HYPERTENSION 11/21/2006   Iron deficiency anemia, unspecified 04/19/2013   Multiple myeloma ([place]) 04/29/2018   NEPHROLITHIASIS, HX OF 11/21/2006 and 10/15/2008   PACEMAKER, PERMANENT 2005   Gen change 2014 Medtronic Adaptic L dual-chamber pacemaker, serial #[id number]    SVT (supraventricular tachycardia) ([place])    TOBACCO ABUSE 10/25/2008   Quit 2012    SURGICAL HISTORY: Past Surgical History:  Procedure Laterality Date   COLONOSCOPY  2004,2009   PACEMAKER INSERTION  05/14/2003   PERMANENT PACEMAKER GENERATOR CHANGE N/A 06/19/2012   Procedure: PERMANENT PACEMAKER GENERATOR CHANGE;  Surgeon: [name], MD; Medtronic Adaptic L dual-chamber pacemaker, serial #[id number]     SKIN GRAFT  Right 05/13/1960   wrist   TOOTH EXTRACTION  11/10/2020   3 teeth removed    SOCIAL HISTORY: Social History   Socioeconomic History   Marital status: Divorced    Spouse name: Not on file   Number of children: 4   Years of education: Not on file   Highest education level: Not on file  Occupational History   Occupation: ENVIROMENTAL SERVICE    Employer: [employer]   Occupation: retired  Tobacco Use   Smoking status: Former    Packs/day: 0.25    Years: 46.00    Pack years: 11.50    Types: Cigarettes    Start date: 03/16/1965    Quit date: 06/15/2010    Years since quitting: 10.9   Smokeless tobacco: Never   Tobacco comments:    quit 3 years ago  Vaping Use   Vaping Use: Never used  Substance and Sexual Activity   Alcohol use: Yes    Comment: Occasional wine with dinner   Drug use: No   Sexual activity: Not on file  Other Topics Concern   Not on file  Social History Narrative   Has relocated from [place] in 2007. 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  Patient in a facility thru [facility]   Social Determinants of Health   Financial Resource Strain: Low Risk    Difficulty of Paying Living Expenses: Not hard at all  Food Insecurity: No Food Insecurity   Worried About Charity fundraiser in the Last Year: Never true   [place] in the Last Year: Never true  Transportation Needs: No Transportation Needs   Lack of Transportation (  Medical): No   Lack of Transportation (Non-Medical): No  Physical Activity: Not on file  Stress: Not on file  Social Connections: Unknown   Frequency of Communication with Friends and Family: More than three times a week   Frequency of Social Gatherings with Friends and Family: More than three times a week   Attends Religious Services: Not on Electrical engineer or Organizations: Not on file   Attends Archivist Meetings: Not on file   Marital Status: Not on file  Intimate Partner Violence: Not on file    FAMILY  HISTORY: Family History  Problem Relation Age of Onset   Heart attack Father        Died, 34   Kidney disease Father    Parkinson's disease Mother        Died, 34   Breast cancer Sister        Living, 51   Diabetes type II Brother        Living, 49   Breast cancer Sister        Living, 74   Diabetes Daughter        Living, 26   Diabetes Son        Living, 73   Ovarian cancer Daughter    Colon cancer Neg Hx    Rectal cancer Neg Hx    Stomach cancer Neg Hx     ALLERGIES:  is allergic to lexapro [escitalopram oxalate].  MEDICATIONS:  Current Outpatient Medications  Medication Sig Dispense Refill   acyclovir (ZOVIRAX) 400 MG tablet Take 1 tablet (400 mg total) by mouth 2 (two) times daily. 60 tablet 11   aspirin EC 81 MG tablet Take 81 mg by mouth daily.     diltiazem (TIAZAC) 240 MG 24 hr capsule Take 1 capsule by mouth daily.     flecainide (TAMBOCOR) 50 MG tablet Take 75 mg by mouth daily as needed.     furosemide (LASIX) 40 MG tablet Take 40 mg by mouth daily.     irbesartan (AVAPRO) 300 MG tablet Take 300 mg by mouth daily.     memantine (NAMENDA) 5 MG tablet Take 5 mg by mouth 2 (two) times daily.     metoprolol tartrate (LOPRESSOR) 50 MG tablet Take 1 tablet (50 mg total) by mouth 2 (two) times daily. 60 tablet 0   ondansetron (ZOFRAN) 8 MG tablet Take 1 tablet (8 mg total) by mouth 2 (two) times daily as needed (Nausea or vomiting). 30 tablet 1   polyethylene glycol (MIRALAX / GLYCOLAX) packet Take 17 g by mouth daily. 14 each 0   prochlorperazine (COMPAZINE) 10 MG tablet Take 1 tablet (10 mg total) by mouth every 6 (six) hours as needed (Nausea or vomiting). 30 tablet 1   REVLIMID 10 MG capsule TAKE 1 CAPSULE BY MOUTH ONCE DAILY FOR 21 DAYS ON AND 7 DAYS OFF 21 capsule 0   Saccharomyces boulardii (FLORASTOR PO) Take 250 mg by mouth 2 (two) times daily.     tamsulosin (FLOMAX) 0.4 MG CAPS capsule Take 2 capsules (0.8 mg total) by mouth daily. 90 capsule 0   traMADol  (ULTRAM) 50 MG tablet Take 1 tablet (50 mg total) by mouth every 6 (six) hours as needed. 30 tablet 0   No current facility-administered medications for this visit.   Facility-Administered Medications Ordered in Other Visits  Medication Dose Route Frequency Provider Last Rate Last Admin   [MAR Hold] sodium chloride flush (NS) 0.9 %  injection 10 mL  10 mL Intracatheter PRN Brunetta Genera, MD        REVIEW OF SYSTEMS:   .10 Point review of Systems was done is negative except as noted above.  PHYSICAL EXAMINATION: ECOG PERFORMANCE STATUS: 2 - Symptomatic, <50% confined to bed  Vital signs stable GENERAL:alert, in no acute distress and comfortable SKIN: no acute rashes, no significant lesions EYES: conjunctiva are pink and non-injected, sclera anicteric OROPHARYNX: MMM, no exudates, no oropharyngeal erythema or ulceration NECK: supple, no JVD LYMPH:  no palpable lymphadenopathy in the cervical, axillary or inguinal regions LUNGS: clear to auscultation b/l with normal respiratory effort HEART: regular rate & rhythm ABDOMEN:  normoactive bowel sounds , non tender, not distended. Extremity: no pedal edema PSYCH: alert & oriented x 3 with fluent speech NEURO: no focal motor/sensory deficits   LABORATORY DATA:  I have reviewed the data as listed  CBC Latest Ref Rng & Units 05/22/2021 05/08/2021 04/24/2021  WBC 4.0 - 10.5 K/uL 4.2 3.3(L) 2.8(L)  Hemoglobin 13.0 - 17.0 g/dL 11.5(L) 11.2(L) 10.8(L)  Hematocrit 39.0 - 52.0 % 35.2(L) 34.5(L) 33.0(L)  Platelets 150 - 400 K/uL 149(L) 92(L) 74(L)    CMP Latest Ref Rng & Units 05/22/2021 05/08/2021 04/24/2021  Glucose 70 - 99 mg/dL 96 92 111(H)  BUN 8 - 23 mg/dL '20 10 16  ' Creatinine 0.61 - 1.24 mg/dL 1.01 0.96 0.88  Sodium 135 - 145 mmol/L 141 138 141  Potassium 3.5 - 5.1 mmol/L 3.7 3.5 3.8  Chloride 98 - 111 mmol/L 106 103 108  CO2 22 - 32 mmol/L '27 28 25  ' Calcium 8.9 - 10.3 mg/dL 9.0 8.9 8.5(L)  Total Protein 6.5 - 8.1 g/dL 6.5  6.1(L) 6.3(L)  Total Bilirubin 0.3 - 1.2 mg/dL 0.6 0.7 0.7  Alkaline Phos 38 - 126 U/L 88 78 82  AST 15 - 41 U/L 13(L) 13(L) 12(L)  ALT 0 - 44 U/L '7 9 9     ' 04/17/18 BM Bx:    RADIOGRAPHIC STUDIES: I have personally reviewed the radiological images as listed and agreed with the findings in the report. No results found.  ASSESSMENT & PLAN:  86 y.o. male with  1.  Relapsed multiple myeloma multiple bone metastases   labs upon initial presentation from 04/14/18; hypercalcemic with Calcium at 13.8, renal function up form baseline with Creatinine at 1.48, HGB at 11.3. PSA was normal.   03/31/18 CT C/A/P revealed 1. Lytic lesion throughout all visualized vertebra from the lower cervical spine through the sacrum. Epidural extension of tumor T2, T3 and L2 level. Right L3-4 foraminal extension tumor. 2. Lytic lesions involving majority of ribs. Multiple pathologic rib fractures with bony expansion/sub pleural extension of tumor at several levels. 3. Lytic lesions of the scapula, hips bilaterally and pelvis bilaterally. With further progression of tumor, patient may be at risk for pathologic fractures. Pathologic fracture right clavicle incompletely assessed. 4. Circumferential narrowing sigmoid colon. Question possibility of underlying mass (series 2, image 97). Alternatively this could represent muscular hypertrophy/peristalsis. 5. Gastric antrum circumferential narrowing. Cannot exclude mass although this may be related to peristalsis. 6. No primary lung malignancy noted. 7. Matted aortic pulmonary window lymph nodes. 8. Circumferential urinary bladder wall thickening greater anterior dome region. Right lateral bladder diverticulum. 9. 5 mm low-density lesion pancreatic body (series 5, image 10) unchanged. This is felt unlikely to be a primary malignancy. 10. Right parapelvic 2.2 cm cyst without significant change. Scattered low-density renal lesions bilaterally too small to adequately characterize.  Some were present previously. Statistically these are likely cysts. 11. Stable adrenal gland hyperplasia. 12. Gynecomastia. 13. Prostate gland causes slight impression upon the bladder base. 14.  Aortic Atherosclerosis. 15. Sequential pacemaker in place. 16. Gallstones.   04/23/18 PET/CT revealed Innumerable hypermetabolic lytic lesions throughout the skeleton especially involving the spine, ribs, bony pelvis along with some involvement of both proximal femurs. The appearance is compatible with pathologic diagnosis of plasma cell neoplasm could well reflect multiple myeloma. Resulting pathologic fractures of the right lateral clavicle and of multiple bilateral ribs. Lesions are present along the cortical margins of the spinal canal. 2. Both the area and the stomach in the area in the sigmoid colon drawn attention to on prior CT scan appear benign normal today. 3. The confluence of AP window lymph nodes measures about 1.2 cm in short axis with maximum SUV 3.1, which is mildly above the blood pool merit surveillance. 4. Other imaging findings of potential clinical significance: Aortic Atherosclerosis. Coronary atherosclerosis. Pacemaker noted. Borderline prostatomegaly. Cholelithiasis.  04/21/18 BM Bx revealed Normocellular bone marrow with Plasma Cell neoplasm. Scattered medium sized clusters of kappa-restricted plasma cells (14% aspirate, 10% CD138 immunohistochemistry.   I do suspect that the burden of disease is underestimated in the bone marrow sample given PET/CT findings of innumerable lytic lesions and an M spike of 3.4g   06/08/18 DG Hips bilat which revealed Innumerable lucencies are noted throughout the pelvis and proximal femurs, similar findings noted on prior PET-CT of 04/23/2018. Findings are consistent patient's known multiple myeloma. Femoral necks are intact. 2.  Aortoiliac atherosclerotic vascular disease.  PLAN:    -Patient's labs results from today were discussed in detail.  His blood  counts and chemistries are stable. Myeloma labs now show no M spike and negative IFE. -No prohibitive toxicities from the patient's current daratumumab and current dose of Revlimid. -Continue daratumumab every 2 weeks -Continue current dose of Revlimid at 10 mg 3 weeks on 1 week off. -Continue acyclovir -Renew aspirin 81 mg p.o. daily. -Continue to hold Xgeva at this time until his dental issues are resolved.-He follows with Dr. Hardie Shackleton his dental surgeon. FOLLOW UP:  Please schedule for daratumumab every 2 weeks for 6 doses with port flush and labs Xgeva still on hold due to dental procedure MD visit in 4 weeks   . The total time spent in the appointment was 30 minutes lab review, lab discussions, safety check, ordering and management of daratumumab and Revlimid treatment and documentation and coordination of care   All of the patient's questions were answered with apparent satisfaction. The patient knows to call the clinic with any problems, questions or concerns.    Sullivan Lone MD [place] [facility] Hematology/Oncology Physician [facility]

## 2021-05-30 ENCOUNTER — Telehealth: Payer: Self-pay | Admitting: Radiation Oncology

## 2021-05-30 NOTE — Telephone Encounter (Signed)
The patient's daughter Ms. Newman called back and we were able to review that he is doing well without concerns of pain since completing radiation and that his skin changes from therapy have subsided. He will continue to follow up with Dr. Irene Limbo for medical management of his myeloma.

## 2021-06-05 ENCOUNTER — Other Ambulatory Visit: Payer: Self-pay

## 2021-06-05 ENCOUNTER — Other Ambulatory Visit: Payer: Self-pay | Admitting: Hematology

## 2021-06-05 ENCOUNTER — Other Ambulatory Visit: Payer: Medicare Other

## 2021-06-05 ENCOUNTER — Inpatient Hospital Stay: Payer: Medicare Other

## 2021-06-05 VITALS — BP 158/83 | HR 64 | Temp 97.9°F | Resp 18 | Ht 70.0 in | Wt 203.2 lb

## 2021-06-05 DIAGNOSIS — C9002 Multiple myeloma in relapse: Secondary | ICD-10-CM | POA: Diagnosis not present

## 2021-06-05 DIAGNOSIS — C9001 Multiple myeloma in remission: Secondary | ICD-10-CM

## 2021-06-05 DIAGNOSIS — Z7189 Other specified counseling: Secondary | ICD-10-CM

## 2021-06-05 DIAGNOSIS — C7951 Secondary malignant neoplasm of bone: Secondary | ICD-10-CM | POA: Diagnosis not present

## 2021-06-05 DIAGNOSIS — Z5112 Encounter for antineoplastic immunotherapy: Secondary | ICD-10-CM

## 2021-06-05 LAB — CBC WITH DIFFERENTIAL/PLATELET
Abs Immature Granulocytes: 0.02 10*3/uL (ref 0.00–0.07)
Basophils Absolute: 0.1 10*3/uL (ref 0.0–0.1)
Basophils Relative: 1 %
Eosinophils Absolute: 0.7 10*3/uL — ABNORMAL HIGH (ref 0.0–0.5)
Eosinophils Relative: 14 %
HCT: 37.4 % — ABNORMAL LOW (ref 39.0–52.0)
Hemoglobin: 12.1 g/dL — ABNORMAL LOW (ref 13.0–17.0)
Immature Granulocytes: 0 %
Lymphocytes Relative: 36 %
Lymphs Abs: 1.8 10*3/uL (ref 0.7–4.0)
MCH: 28.3 pg (ref 26.0–34.0)
MCHC: 32.4 g/dL (ref 30.0–36.0)
MCV: 87.4 fL (ref 80.0–100.0)
Monocytes Absolute: 0.3 10*3/uL (ref 0.1–1.0)
Monocytes Relative: 7 %
Neutro Abs: 2 10*3/uL (ref 1.7–7.7)
Neutrophils Relative %: 42 %
Platelets: 115 10*3/uL — ABNORMAL LOW (ref 150–400)
RBC: 4.28 MIL/uL (ref 4.22–5.81)
RDW: 17.6 % — ABNORMAL HIGH (ref 11.5–15.5)
WBC: 4.9 10*3/uL (ref 4.0–10.5)
nRBC: 0 % (ref 0.0–0.2)

## 2021-06-05 LAB — CMP (CANCER CENTER ONLY)
ALT: 8 U/L (ref 0–44)
AST: 14 U/L — ABNORMAL LOW (ref 15–41)
Albumin: 3.9 g/dL (ref 3.5–5.0)
Alkaline Phosphatase: 107 U/L (ref 38–126)
Anion gap: 8 (ref 5–15)
BUN: 19 mg/dL (ref 8–23)
CO2: 28 mmol/L (ref 22–32)
Calcium: 9.5 mg/dL (ref 8.9–10.3)
Chloride: 106 mmol/L (ref 98–111)
Creatinine: 1.02 mg/dL (ref 0.61–1.24)
GFR, Estimated: >60 mL/min (ref >60)
Glucose, Bld: 96 mg/dL (ref 70–99)
Potassium: 3.5 mmol/L (ref 3.5–5.1)
Sodium: 142 mmol/L (ref 135–145)
Total Bilirubin: 0.7 mg/dL (ref 0.3–1.2)
Total Protein: 6.8 g/dL (ref 6.5–8.1)

## 2021-06-05 MED ORDER — SODIUM CHLORIDE 0.9 % IV SOLN
16.0000 mg/kg | Freq: Once | INTRAVENOUS | Status: AC
  Administered 2021-06-05: 13:00:00 1600 mg via INTRAVENOUS
  Filled 2021-06-05: qty 80

## 2021-06-05 MED ORDER — SODIUM CHLORIDE 0.9 % IV SOLN: Freq: Once | INTRAVENOUS | Status: AC

## 2021-06-05 MED ORDER — FAMOTIDINE 20 MG IN NS 100 ML IVPB
20.0000 mg | Freq: Once | INTRAVENOUS | Status: AC
  Administered 2021-06-05: 12:00:00 20 mg via INTRAVENOUS
  Filled 2021-06-05: qty 100

## 2021-06-05 MED ORDER — HEPARIN SOD (PORK) LOCK FLUSH 100 UNIT/ML IV SOLN: 500.0000 [IU] | Freq: Once | INTRAVENOUS | Status: DC | PRN

## 2021-06-05 MED ORDER — SODIUM CHLORIDE 0.9% FLUSH: 10.0000 mL | INTRAVENOUS | Status: DC | PRN

## 2021-06-05 MED ORDER — SODIUM CHLORIDE 0.9 % IV SOLN
20.0000 mg | Freq: Once | INTRAVENOUS | Status: AC
  Administered 2021-06-05: 12:00:00 20 mg via INTRAVENOUS
  Filled 2021-06-05: qty 20

## 2021-06-05 MED ORDER — ACETAMINOPHEN 325 MG PO TABS
650.0000 mg | ORAL_TABLET | Freq: Once | ORAL | Status: AC
  Administered 2021-06-05: 12:00:00 650 mg via ORAL
  Filled 2021-06-05: qty 2

## 2021-06-05 MED ORDER — DIPHENHYDRAMINE HCL 25 MG PO CAPS
50.0000 mg | ORAL_CAPSULE | Freq: Once | ORAL | Status: AC
  Administered 2021-06-05: 12:00:00 50 mg via ORAL
  Filled 2021-06-05: qty 2

## 2021-06-05 MED ORDER — MONTELUKAST SODIUM 10 MG PO TABS
10.0000 mg | ORAL_TABLET | Freq: Once | ORAL | Status: AC
  Administered 2021-06-05: 12:00:00 10 mg via ORAL
  Filled 2021-06-05: qty 1

## 2021-06-05 NOTE — Patient Instructions (Signed)
[facility]  Discharge Instructions: °Thank you for choosing [facility] to provide your oncology and hematology care.  ° °If you have a lab appointment with the Cancer Center, please go directly to the Cancer Center and check in at the registration area. °  °Wear comfortable clothing and clothing appropriate for easy access to any Portacath or PICC line.  ° °We strive to give you quality time with your provider. You may need to reschedule your appointment if you arrive late (15 or more minutes).  Arriving late affects you and other patients whose appointments are after yours.  Also, if you miss three or more appointments without notifying the office, you may be dismissed from the clinic at the provider’s discretion.    °  °For prescription refill requests, have your pharmacy contact our office and allow 72 hours for refills to be completed.   ° °Today you received the following chemotherapy and/or immunotherapy agents: Daratumumab.     °  °To help prevent nausea and vomiting after your treatment, we encourage you to take your nausea medication as directed. ° °BELOW ARE SYMPTOMS THAT SHOULD BE REPORTED IMMEDIATELY: °*FEVER GREATER THAN 100.4 F (38 °C) OR HIGHER °*CHILLS OR SWEATING °*NAUSEA AND VOMITING THAT IS NOT CONTROLLED WITH YOUR NAUSEA MEDICATION °*UNUSUAL SHORTNESS OF BREATH °*UNUSUAL BRUISING OR BLEEDING °*URINARY PROBLEMS (pain or burning when urinating, or frequent urination) °*BOWEL PROBLEMS (unusual diarrhea, constipation, pain near the anus) °TENDERNESS IN MOUTH AND THROAT WITH OR WITHOUT PRESENCE OF ULCERS (sore throat, sores in mouth, or a toothache) °UNUSUAL RASH, SWELLING OR PAIN  °UNUSUAL VAGINAL DISCHARGE OR ITCHING  ° °Items with * indicate a potential emergency and should be followed up as soon as possible or go to the Emergency Department if any problems should occur. ° °Please show the CHEMOTHERAPY ALERT CARD or IMMUNOTHERAPY ALERT CARD at check-in  to the Emergency Department and triage nurse. ° °Should you have questions after your visit or need to cancel or reschedule your appointment, please contact [facility]  Dept: [phone]  and follow the prompts.  Office hours are 8:00 a.m. to 4:30 p.m. Monday - Friday. Please note that voicemails left after 4:00 p.m. may not be returned until the following business day.  We are closed weekends and major holidays. You have access to a nurse at all times for urgent questions. Please call the main number to the clinic Dept: [phone] and follow the prompts. ° ° °For any non-urgent questions, you may also contact your provider using MyChart. We now offer e-Visits for anyone 18 and older to request care online for non-urgent symptoms. For details visit [email or web address]. °  °Also download the MyChart app! Go to the app store, search "MyChart", open the app, select [facility], and log in with your MyChart username and password. ° °Due to Covid, a mask is required upon entering the hospital/clinic. If you do not have a mask, one will be given to you upon arrival. For doctor visits, patients may have 1 support person aged 18 or older with them. For treatment visits, patients cannot have anyone with them due to current Covid guidelines and our immunocompromised population.  ° °

## 2021-06-08 LAB — MULTIPLE MYELOMA PANEL, SERUM
Albumin SerPl Elph-Mcnc: 3.5 g/dL (ref 2.9–4.4)
Albumin/Glob SerPl: 1.3 (ref 0.7–1.7)
Alpha 1: 0.2 g/dL (ref 0.0–0.4)
Alpha2 Glob SerPl Elph-Mcnc: 0.9 g/dL (ref 0.4–1.0)
B-Globulin SerPl Elph-Mcnc: 1 g/dL (ref 0.7–1.3)
Gamma Glob SerPl Elph-Mcnc: 0.6 g/dL (ref 0.4–1.8)
Globulin, Total: 2.8 g/dL (ref 2.2–3.9)
IgA: 158 mg/dL (ref 61–437)
IgG (Immunoglobin G), Serum: 713 mg/dL (ref 603–1613)
IgM (Immunoglobulin M), Srm: 108 mg/dL (ref 15–143)
M Protein SerPl Elph-Mcnc: 0.2 g/dL — ABNORMAL HIGH
Total Protein ELP: 6.3 g/dL (ref 6.0–8.5)

## 2021-06-19 ENCOUNTER — Inpatient Hospital Stay: Payer: Medicare Other | Attending: Hematology

## 2021-06-19 ENCOUNTER — Other Ambulatory Visit: Payer: Self-pay

## 2021-06-19 ENCOUNTER — Inpatient Hospital Stay (HOSPITAL_BASED_OUTPATIENT_CLINIC_OR_DEPARTMENT_OTHER): Payer: Medicare Other | Admitting: Hematology

## 2021-06-19 ENCOUNTER — Other Ambulatory Visit: Payer: Medicare Other

## 2021-06-19 ENCOUNTER — Inpatient Hospital Stay: Payer: Medicare Other

## 2021-06-19 ENCOUNTER — Other Ambulatory Visit: Payer: Self-pay | Admitting: Hematology

## 2021-06-19 VITALS — BP 140/75 | HR 59 | Temp 97.7°F | Resp 17 | Wt 203.0 lb

## 2021-06-19 DIAGNOSIS — C9000 Multiple myeloma not having achieved remission: Secondary | ICD-10-CM

## 2021-06-19 DIAGNOSIS — Z5112 Encounter for antineoplastic immunotherapy: Secondary | ICD-10-CM | POA: Insufficient documentation

## 2021-06-19 DIAGNOSIS — C9001 Multiple myeloma in remission: Secondary | ICD-10-CM

## 2021-06-19 DIAGNOSIS — C9002 Multiple myeloma in relapse: Secondary | ICD-10-CM | POA: Diagnosis not present

## 2021-06-19 DIAGNOSIS — Z7189 Other specified counseling: Secondary | ICD-10-CM

## 2021-06-19 LAB — CBC WITH DIFFERENTIAL/PLATELET
Abs Immature Granulocytes: 0.01 10*3/uL (ref 0.00–0.07)
Basophils Absolute: 0.1 10*3/uL (ref 0.0–0.1)
Basophils Relative: 2 %
Eosinophils Absolute: 0.7 10*3/uL — ABNORMAL HIGH (ref 0.0–0.5)
Eosinophils Relative: 18 %
HCT: 36.1 % — ABNORMAL LOW (ref 39.0–52.0)
Hemoglobin: 11.7 g/dL — ABNORMAL LOW (ref 13.0–17.0)
Immature Granulocytes: 0 %
Lymphocytes Relative: 38 %
Lymphs Abs: 1.5 10*3/uL (ref 0.7–4.0)
MCH: 28.1 pg (ref 26.0–34.0)
MCHC: 32.4 g/dL (ref 30.0–36.0)
MCV: 86.6 fL (ref 80.0–100.0)
Monocytes Absolute: 0.4 10*3/uL (ref 0.1–1.0)
Monocytes Relative: 10 %
Neutro Abs: 1.2 10*3/uL — ABNORMAL LOW (ref 1.7–7.7)
Neutrophils Relative %: 32 %
Platelets: 80 10*3/uL — ABNORMAL LOW (ref 150–400)
RBC: 4.17 MIL/uL — ABNORMAL LOW (ref 4.22–5.81)
RDW: 16.6 % — ABNORMAL HIGH (ref 11.5–15.5)
WBC: 3.8 10*3/uL — ABNORMAL LOW (ref 4.0–10.5)
nRBC: 0 % (ref 0.0–0.2)

## 2021-06-19 LAB — CMP (CANCER CENTER ONLY)
ALT: 9 U/L (ref 0–44)
AST: 12 U/L — ABNORMAL LOW (ref 15–41)
Albumin: 3.7 g/dL (ref 3.5–5.0)
Alkaline Phosphatase: 105 U/L (ref 38–126)
Anion gap: 8 (ref 5–15)
BUN: 15 mg/dL (ref 8–23)
CO2: 28 mmol/L (ref 22–32)
Calcium: 9.1 mg/dL (ref 8.9–10.3)
Chloride: 103 mmol/L (ref 98–111)
Creatinine: 1.1 mg/dL (ref 0.61–1.24)
GFR, Estimated: >60 mL/min (ref >60)
Glucose, Bld: 95 mg/dL (ref 70–99)
Potassium: 3.7 mmol/L (ref 3.5–5.1)
Sodium: 139 mmol/L (ref 135–145)
Total Bilirubin: 1 mg/dL (ref 0.3–1.2)
Total Protein: 6.5 g/dL (ref 6.5–8.1)

## 2021-06-19 MED ORDER — FAMOTIDINE IN NACL 20-0.9 MG/50ML-% IV SOLN
20.0000 mg | Freq: Once | INTRAVENOUS | Status: AC
  Administered 2021-06-19: 20 mg via INTRAVENOUS
  Filled 2021-06-19: qty 50

## 2021-06-19 MED ORDER — SODIUM CHLORIDE 0.9 % IV SOLN
16.0000 mg/kg | Freq: Once | INTRAVENOUS | Status: AC
  Administered 2021-06-19: 1600 mg via INTRAVENOUS
  Filled 2021-06-19: qty 80

## 2021-06-19 MED ORDER — SODIUM CHLORIDE 0.9 % IV SOLN
20.0000 mg | Freq: Once | INTRAVENOUS | Status: AC
  Administered 2021-06-19: 20 mg via INTRAVENOUS
  Filled 2021-06-19: qty 20

## 2021-06-19 MED ORDER — MONTELUKAST SODIUM 10 MG PO TABS
10.0000 mg | ORAL_TABLET | Freq: Once | ORAL | Status: AC
  Administered 2021-06-19: 10 mg via ORAL
  Filled 2021-06-19: qty 1

## 2021-06-19 MED ORDER — SODIUM CHLORIDE 0.9 % IV SOLN: Freq: Once | INTRAVENOUS | Status: AC

## 2021-06-19 MED ORDER — ACETAMINOPHEN 325 MG PO TABS
650.0000 mg | ORAL_TABLET | Freq: Once | ORAL | Status: AC
  Administered 2021-06-19: 650 mg via ORAL
  Filled 2021-06-19: qty 2

## 2021-06-19 MED ORDER — DIPHENHYDRAMINE HCL 25 MG PO CAPS
50.0000 mg | ORAL_CAPSULE | Freq: Once | ORAL | Status: AC
  Administered 2021-06-19: 50 mg via ORAL
  Filled 2021-06-19: qty 2

## 2021-06-19 NOTE — Patient Instructions (Signed)
[facility]  Discharge Instructions: °Thank you for choosing [facility] to provide your oncology and hematology care.  ° °If you have a lab appointment with the Cancer Center, please go directly to the Cancer Center and check in at the registration area. °  °Wear comfortable clothing and clothing appropriate for easy access to any Portacath or PICC line.  ° °We strive to give you quality time with your provider. You may need to reschedule your appointment if you arrive late (15 or more minutes).  Arriving late affects you and other patients whose appointments are after yours.  Also, if you miss three or more appointments without notifying the office, you may be dismissed from the clinic at the provider’s discretion.    °  °For prescription refill requests, have your pharmacy contact our office and allow 72 hours for refills to be completed.   ° °Today you received the following chemotherapy and/or immunotherapy agents: Daratumumab.     °  °To help prevent nausea and vomiting after your treatment, we encourage you to take your nausea medication as directed. ° °BELOW ARE SYMPTOMS THAT SHOULD BE REPORTED IMMEDIATELY: °*FEVER GREATER THAN 100.4 F (38 °C) OR HIGHER °*CHILLS OR SWEATING °*NAUSEA AND VOMITING THAT IS NOT CONTROLLED WITH YOUR NAUSEA MEDICATION °*UNUSUAL SHORTNESS OF BREATH °*UNUSUAL BRUISING OR BLEEDING °*URINARY PROBLEMS (pain or burning when urinating, or frequent urination) °*BOWEL PROBLEMS (unusual diarrhea, constipation, pain near the anus) °TENDERNESS IN MOUTH AND THROAT WITH OR WITHOUT PRESENCE OF ULCERS (sore throat, sores in mouth, or a toothache) °UNUSUAL RASH, SWELLING OR PAIN  °UNUSUAL VAGINAL DISCHARGE OR ITCHING  ° °Items with * indicate a potential emergency and should be followed up as soon as possible or go to the Emergency Department if any problems should occur. ° °Please show the CHEMOTHERAPY ALERT CARD or IMMUNOTHERAPY ALERT CARD at check-in  to the Emergency Department and triage nurse. ° °Should you have questions after your visit or need to cancel or reschedule your appointment, please contact [facility]  Dept: [phone]  and follow the prompts.  Office hours are 8:00 a.m. to 4:30 p.m. Monday - Friday. Please note that voicemails left after 4:00 p.m. may not be returned until the following business day.  We are closed weekends and major holidays. You have access to a nurse at all times for urgent questions. Please call the main number to the clinic Dept: [phone] and follow the prompts. ° ° °For any non-urgent questions, you may also contact your provider using MyChart. We now offer e-Visits for anyone 18 and older to request care online for non-urgent symptoms. For details visit [email or web address]. °  °Also download the MyChart app! Go to the app store, search "MyChart", open the app, select [facility], and log in with your MyChart username and password. ° °Due to Covid, a mask is required upon entering the hospital/clinic. If you do not have a mask, one will be given to you upon arrival. For doctor visits, patients may have 1 support person aged 18 or older with them. For treatment visits, patients cannot have anyone with them due to current Covid guidelines and our immunocompromised population.  ° °

## 2021-06-19 NOTE — Progress Notes (Signed)
Ok to proceed with today's labs per Dr.Kale

## 2021-06-20 ENCOUNTER — Telehealth: Payer: Self-pay | Admitting: Hematology

## 2021-06-20 NOTE — Telephone Encounter (Signed)
Scheduled follow-up appointment per 2/7 los. Patient's daughter is aware.

## 2021-06-25 ENCOUNTER — Encounter: Payer: Self-pay | Admitting: Hematology

## 2021-06-25 LAB — MULTIPLE MYELOMA PANEL, SERUM
Albumin SerPl Elph-Mcnc: 3.3 g/dL (ref 2.9–4.4)
Albumin/Glob SerPl: 1.2 (ref 0.7–1.7)
Alpha 1: 0.3 g/dL (ref 0.0–0.4)
Alpha2 Glob SerPl Elph-Mcnc: 0.9 g/dL (ref 0.4–1.0)
B-Globulin SerPl Elph-Mcnc: 1 g/dL (ref 0.7–1.3)
Gamma Glob SerPl Elph-Mcnc: 0.6 g/dL (ref 0.4–1.8)
Globulin, Total: 2.8 g/dL (ref 2.2–3.9)
IgA: 176 mg/dL (ref 61–437)
IgG (Immunoglobin G), Serum: 647 mg/dL (ref 603–1613)
IgM (Immunoglobulin M), Srm: 84 mg/dL (ref 15–143)
M Protein SerPl Elph-Mcnc: 0.2 g/dL — ABNORMAL HIGH
Total Protein ELP: 6.1 g/dL (ref 6.0–8.5)

## 2021-06-25 NOTE — Progress Notes (Addendum)
HEMATOLOGY/ONCOLOGY CLINIC NOTE  Date of Service: .06/19/2021   Patient Care Team: [name], MD as PCP - General (Family Medicine) [name], MD (Cardiology) [name], MD (Cardiology) [name], DO as Consulting Physician (Neurology)  CHIEF COMPLAINTS/PURPOSE OF CONSULTATION:  Follow-up for continued management of multiple myeloma  HISTORY OF PRESENTING ILLNESS:  Please see previous note for details on initial presentation.  INTERVAL HISTORY:   Jonathon [name] is here for continued evaluation and management of his relapsed multiple myeloma.  He notes no acute new symptoms since his last clinic visit. His back pain and shoulder pain have nearly resolved. No notable toxicities from his daratumumab/Revlimid/dexamethasone at this time. He has been eating well and has been trying to stay active. No infection issues since his last clinic visit.  His dental ulcer is healing but we still have him on hold from [place] until he has dental clearance.  MEDICAL HISTORY:  Past Medical History:  Diagnosis Date   Asthma    BENIGN PROSTATIC HYPERTROPHY, HX OF 10/25/2008   Bone metastases ([place]) 04/14/2018   BRADYCARDIA 2005   Chronic diastolic congestive heart failure ([place]) 06/05/2016   CIDP (chronic inflammatory demyelinating polyneuropathy) ([place])    HYPERTENSION 11/21/2006   Iron deficiency anemia, unspecified 04/19/2013   Multiple myeloma ([place]) 04/29/2018   NEPHROLITHIASIS, HX OF 11/21/2006 and 10/15/2008   PACEMAKER, PERMANENT 2005   Gen change 2014 Medtronic Adaptic L dual-chamber pacemaker, serial #[id number]    SVT (supraventricular tachycardia) ([place])    TOBACCO ABUSE 10/25/2008   Quit 2012    SURGICAL HISTORY: Past Surgical History:  Procedure Laterality Date   COLONOSCOPY  2004,2009   PACEMAKER INSERTION  05/14/2003   PERMANENT PACEMAKER GENERATOR CHANGE N/A 06/19/2012   Procedure: PERMANENT PACEMAKER GENERATOR CHANGE;  Surgeon: [name], MD;  Medtronic Adaptic L dual-chamber pacemaker, serial #[id number]     SKIN GRAFT Right 05/13/1960   wrist   TOOTH EXTRACTION  11/10/2020   3 teeth removed    SOCIAL HISTORY: Social History   Socioeconomic History   Marital status: Divorced    Spouse name: Not on file   Number of children: 4   Years of education: Not on file   Highest education level: Not on file  Occupational History   Occupation: ENVIROMENTAL SERVICE    Employer: [employer]   Occupation: retired  Tobacco Use   Smoking status: Former    Packs/day: 0.25    Years: 46.00    Pack years: 11.50    Types: Cigarettes    Start date: 03/16/1965    Quit date: 06/15/2010    Years since quitting: 11.0   Smokeless tobacco: Never   Tobacco comments:    quit 3 years ago  Vaping Use   Vaping Use: Never used  Substance and Sexual Activity   Alcohol use: Yes    Comment: Occasional wine with dinner   Drug use: No   Sexual activity: Not on file  Other Topics Concern   Not on file  Social History Narrative   Has relocated from [place] in 2007. 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  Patient in a facility thru [facility]   Social Determinants of Health   Financial Resource Strain: Low Risk    Difficulty of Paying Living Expenses: Not hard at all  Food Insecurity: No Food Insecurity   Worried About Charity fundraiser in the Last Year: Never true   [place] in the Last Year: Never  true  Transportation Needs: No Transportation Needs   Lack of Transportation (Medical): No   Lack of Transportation (Non-Medical): No  Physical Activity: Not on file  Stress: Not on file  Social Connections: Unknown   Frequency of Communication with Friends and Family: More than three times a week   Frequency of Social Gatherings with Friends and Family: More than three times a week   Attends Religious Services: Not on Electrical engineer or Organizations: Not on file   Attends Archivist Meetings: Not on file   Marital  Status: Not on file  Intimate Partner Violence: Not on file    FAMILY HISTORY: Family History  Problem Relation Age of Onset   Heart attack Father        Died, 15   Kidney disease Father    Parkinson's disease Mother        Died, 61   Breast cancer Sister        Living, 72   Diabetes type II Brother        Living, 67   Breast cancer Sister        Living, 82   Diabetes Daughter        Living, 59   Diabetes Son        Living, 31   Ovarian cancer Daughter    Colon cancer Neg Hx    Rectal cancer Neg Hx    Stomach cancer Neg Hx     ALLERGIES:  is allergic to lexapro [escitalopram oxalate].  MEDICATIONS:  Current Outpatient Medications  Medication Sig Dispense Refill   acyclovir (ZOVIRAX) 400 MG tablet Take 1 tablet (400 mg total) by mouth 2 (two) times daily. 60 tablet 11   aspirin EC 81 MG tablet Take 81 mg by mouth daily.     diltiazem (TIAZAC) 240 MG 24 hr capsule Take 1 capsule by mouth daily.     flecainide (TAMBOCOR) 50 MG tablet Take 75 mg by mouth daily as needed.     furosemide (LASIX) 40 MG tablet Take 40 mg by mouth daily.     irbesartan (AVAPRO) 300 MG tablet Take 300 mg by mouth daily.     memantine (NAMENDA) 5 MG tablet Take 5 mg by mouth 2 (two) times daily.     metoprolol tartrate (LOPRESSOR) 50 MG tablet Take 1 tablet (50 mg total) by mouth 2 (two) times daily. 60 tablet 0   ondansetron (ZOFRAN) 8 MG tablet Take 1 tablet (8 mg total) by mouth 2 (two) times daily as needed (Nausea or vomiting). 30 tablet 1   polyethylene glycol (MIRALAX / GLYCOLAX) packet Take 17 g by mouth daily. 14 each 0   prochlorperazine (COMPAZINE) 10 MG tablet Take 1 tablet (10 mg total) by mouth every 6 (six) hours as needed (Nausea or vomiting). 30 tablet 1   REVLIMID 10 MG capsule TAKE 1 CAPSULE BY MOUTH ONCE DAILY FOR 21 DAYS ON AND 7 DAYS OFF 21 capsule 0   Saccharomyces boulardii (FLORASTOR PO) Take 250 mg by mouth 2 (two) times daily.     tamsulosin (FLOMAX) 0.4 MG CAPS capsule  Take 2 capsules (0.8 mg total) by mouth daily. 90 capsule 0   traMADol (ULTRAM) 50 MG tablet Take 1 tablet (50 mg total) by mouth every 6 (six) hours as needed. 30 tablet 0   No current facility-administered medications for this visit.   Facility-Administered Medications Ordered in Other Visits  Medication Dose Route Frequency Provider Last Rate  Last Admin   [facility] Hold] sodium chloride flush (NS) 0.9 % injection 10 mL  10 mL Intracatheter PRN Brunetta Genera, MD        REVIEW OF SYSTEMS:   10 Point review of Systems was done is negative except as noted above.  PHYSICAL EXAMINATION: ECOG PERFORMANCE STATUS: 2 - Symptomatic, <50% confined to bed  NAD GENERAL:alert, in no acute distress and comfortable SKIN: no acute rashes, no significant lesions EYES: conjunctiva are pink and non-injected, sclera anicteric OROPHARYNX: MMM, no exudates, no oropharyngeal erythema or ulceration NECK: supple, no JVD LYMPH:  no palpable lymphadenopathy in the cervical, axillary or inguinal regions LUNGS: clear to auscultation b/l with normal respiratory effort HEART: regular rate & rhythm ABDOMEN:  normoactive bowel sounds , non tender, not distended. Extremity: no pedal edema PSYCH: alert & oriented x 3 with fluent speech NEURO: no focal motor/sensory deficits  LABORATORY DATA:  I have reviewed the data as listed  CBC Latest Ref Rng & Units 06/19/2021 06/05/2021  WBC 4.0 - 10.5 K/uL 3.8(L) 4.9  Hemoglobin 13.0 - 17.0 g/dL 11.7(L) 12.1(L)  Hematocrit 39.0 - 52.0 % 36.1(L) 37.4(L)  Platelets 150 - 400 K/uL 80(L) 115(L)    CMP Latest Ref Rng & Units 06/19/2021 06/05/2021  Glucose 70 - 99 mg/dL 95 96  BUN 8 - 23 mg/dL 15 19  Creatinine 0.61 - 1.24 mg/dL 1.10 1.02  Sodium 135 - 145 mmol/L 139 142  Potassium 3.5 - 5.1 mmol/L 3.7 3.5  Chloride 98 - 111 mmol/L 103 106  CO2 22 - 32 mmol/L 28 28  Calcium 8.9 - 10.3 mg/dL 9.1 9.5  Total Protein 6.5 - 8.1 g/dL 6.5 6.8  Total Bilirubin 0.3 - 1.2 mg/dL  1.0 0.7  Alkaline Phos 38 - 126 U/L 105 107  AST 15 - 41 U/L 12(L) 14(L)  ALT 0 - 44 U/L 9 8      04/17/18 BM Bx:    RADIOGRAPHIC STUDIES: I have personally reviewed the radiological images as listed and agreed with the findings in the report. No results found.  ASSESSMENT & PLAN:  86 y.o. male with  1.  Relapsed multiple myeloma multiple bone metastases   labs upon initial presentation from 04/14/18; hypercalcemic with Calcium at 13.8, renal function up form baseline with Creatinine at 1.48, HGB at 11.3. PSA was normal.   03/31/18 CT C/A/P revealed 1. Lytic lesion throughout all visualized vertebra from the lower cervical spine through the sacrum. Epidural extension of tumor T2, T3 and L2 level. Right L3-4 foraminal extension tumor. 2. Lytic lesions involving majority of ribs. Multiple pathologic rib fractures with bony expansion/sub pleural extension of tumor at several levels. 3. Lytic lesions of the scapula, hips bilaterally and pelvis bilaterally. With further progression of tumor, patient may be at risk for pathologic fractures. Pathologic fracture right clavicle incompletely assessed. 4. Circumferential narrowing sigmoid colon. Question possibility of underlying mass (series 2, image 97). Alternatively this could represent muscular hypertrophy/peristalsis. 5. Gastric antrum circumferential narrowing. Cannot exclude mass although this may be related to peristalsis. 6. No primary lung malignancy noted. 7. Matted aortic pulmonary window lymph nodes. 8. Circumferential urinary bladder wall thickening greater anterior dome region. Right lateral bladder diverticulum. 9. 5 mm low-density lesion pancreatic body (series 5, image 10) unchanged. This is felt unlikely to be a primary malignancy. 10. Right parapelvic 2.2 cm cyst without significant change. Scattered low-density renal lesions bilaterally too small to adequately characterize. Some were present previously. Statistically these are likely  cysts. 11. Stable adrenal gland hyperplasia. 12. Gynecomastia. 13. Prostate gland causes slight impression upon the bladder base. 14.  Aortic Atherosclerosis. 15. Sequential pacemaker in place. 16. Gallstones.   04/23/18 PET/CT revealed Innumerable hypermetabolic lytic lesions throughout the skeleton especially involving the spine, ribs, bony pelvis along with some involvement of both proximal femurs. The appearance is compatible with pathologic diagnosis of plasma cell neoplasm could well reflect multiple myeloma. Resulting pathologic fractures of the right lateral clavicle and of multiple bilateral ribs. Lesions are present along the cortical margins of the spinal canal. 2. Both the area and the stomach in the area in the sigmoid colon drawn attention to on prior CT scan appear benign normal today. 3. The confluence of AP window lymph nodes measures about 1.2 cm in short axis with maximum SUV 3.1, which is mildly above the blood pool merit surveillance. 4. Other imaging findings of potential clinical significance: Aortic Atherosclerosis. Coronary atherosclerosis. Pacemaker noted. Borderline prostatomegaly. Cholelithiasis.  04/21/18 BM Bx revealed Normocellular bone marrow with Plasma Cell neoplasm. Scattered medium sized clusters of kappa-restricted plasma cells (14% aspirate, 10% CD138 immunohistochemistry.   I do suspect that the burden of disease is underestimated in the bone marrow sample given PET/CT findings of innumerable lytic lesions and an M spike of 3.4g   06/08/18 DG Hips bilat which revealed Innumerable lucencies are noted throughout the pelvis and proximal femurs, similar findings noted on prior PET-CT of 04/23/2018. Findings are consistent patient's known multiple myeloma. Femoral necks are intact. 2.  Aortoiliac atherosclerotic vascular disease.  PLAN:    -Patient's labs from 06/19/2021 reviewed CBC shows hemoglobin of 11.7 with normal WBC count of 3.8k and platelets of 80k CMP is  stable Myeloma panel shows M spike of 0.2 g/dL which appears to be his new baseline except 1 reading where the M spike was not observable. -Patient has no primary toxicities from his current daratumumab every 2 weeks and Revlimid at 10 mg 3 weeks on 1 week off. -He has some mild thrombocytopenia which we shall monitor -Daratumumab orders were reviewed and placed we will continue the same supportive treatments. -Continue aspirin 81 mg p.o. daily -Continue Revlimid at 10 mg 3 weeks on 1 week off... We will considering increasing dose depending on response reassessment his blood counts. -Continue acyclovir for prophylaxis -Continue to hold Xgeva until his dental issues are completely resolved and he is cleared by Dr. Hardie Shackleton his dental surgeon.  FOLLOW UP: Please schedule for daratumumab every 2 weeks for 4 doses with port flush and labs MD visit in 4 weeks   The total time spent in the appointment was 32 minutes*.  All of the patient's questions were answered with apparent satisfaction. The patient knows to call the clinic with any problems, questions or concerns.   Sullivan Lone MD MS AAHIVMS [facility] Hematology/Oncology Physician [facility]  .*Total Encounter Time as defined by the Centers for Medicare and Medicaid Services includes, in addition to the face-to-face time of a patient visit (documented in the note above) non-face-to-face time: obtaining and reviewing outside history, ordering and reviewing medications, tests or procedures, care coordination (communications with other health care professionals or caregivers) and documentation in the medical record.

## 2021-07-02 ENCOUNTER — Other Ambulatory Visit: Payer: Self-pay

## 2021-07-02 DIAGNOSIS — C9002 Multiple myeloma in relapse: Secondary | ICD-10-CM

## 2021-07-03 ENCOUNTER — Inpatient Hospital Stay: Payer: Medicare Other

## 2021-07-03 ENCOUNTER — Other Ambulatory Visit: Payer: Medicare Other

## 2021-07-03 ENCOUNTER — Other Ambulatory Visit: Payer: Self-pay | Admitting: Hematology

## 2021-07-03 ENCOUNTER — Other Ambulatory Visit: Payer: Self-pay

## 2021-07-03 VITALS — BP 132/78 | HR 78 | Temp 98.2°F | Resp 18 | Wt 204.5 lb

## 2021-07-03 DIAGNOSIS — C9001 Multiple myeloma in remission: Secondary | ICD-10-CM

## 2021-07-03 DIAGNOSIS — Z5112 Encounter for antineoplastic immunotherapy: Secondary | ICD-10-CM | POA: Diagnosis not present

## 2021-07-03 DIAGNOSIS — C9002 Multiple myeloma in relapse: Secondary | ICD-10-CM | POA: Diagnosis not present

## 2021-07-03 DIAGNOSIS — Z7189 Other specified counseling: Secondary | ICD-10-CM

## 2021-07-03 LAB — CBC WITH DIFFERENTIAL (CANCER CENTER ONLY)
Abs Immature Granulocytes: 0.02 10*3/uL (ref 0.00–0.07)
Basophils Absolute: 0.1 10*3/uL (ref 0.0–0.1)
Basophils Relative: 2 %
Eosinophils Absolute: 0.3 10*3/uL (ref 0.0–0.5)
Eosinophils Relative: 7 %
HCT: 36.4 % — ABNORMAL LOW (ref 39.0–52.0)
Hemoglobin: 11.8 g/dL — ABNORMAL LOW (ref 13.0–17.0)
Immature Granulocytes: 1 %
Lymphocytes Relative: 30 %
Lymphs Abs: 1.2 10*3/uL (ref 0.7–4.0)
MCH: 28.3 pg (ref 26.0–34.0)
MCHC: 32.4 g/dL (ref 30.0–36.0)
MCV: 87.3 fL (ref 80.0–100.0)
Monocytes Absolute: 0.3 10*3/uL (ref 0.1–1.0)
Monocytes Relative: 8 %
Neutro Abs: 2.1 10*3/uL (ref 1.7–7.7)
Neutrophils Relative %: 52 %
Platelet Count: 98 10*3/uL — ABNORMAL LOW (ref 150–400)
RBC: 4.17 MIL/uL — ABNORMAL LOW (ref 4.22–5.81)
RDW: 15.8 % — ABNORMAL HIGH (ref 11.5–15.5)
WBC Count: 4 10*3/uL (ref 4.0–10.5)
nRBC: 0 % (ref 0.0–0.2)

## 2021-07-03 LAB — CMP (CANCER CENTER ONLY)
ALT: 7 U/L (ref 0–44)
AST: 12 U/L — ABNORMAL LOW (ref 15–41)
Albumin: 3.7 g/dL (ref 3.5–5.0)
Alkaline Phosphatase: 109 U/L (ref 38–126)
Anion gap: 4 — ABNORMAL LOW (ref 5–15)
BUN: 17 mg/dL (ref 8–23)
CO2: 31 mmol/L (ref 22–32)
Calcium: 8.9 mg/dL (ref 8.9–10.3)
Chloride: 101 mmol/L (ref 98–111)
Creatinine: 1.11 mg/dL (ref 0.61–1.24)
GFR, Estimated: >60 mL/min (ref >60)
Glucose, Bld: 105 mg/dL — ABNORMAL HIGH (ref 70–99)
Potassium: 3.8 mmol/L (ref 3.5–5.1)
Sodium: 136 mmol/L (ref 135–145)
Total Bilirubin: 0.8 mg/dL (ref 0.3–1.2)
Total Protein: 6.4 g/dL — ABNORMAL LOW (ref 6.5–8.1)

## 2021-07-03 MED ORDER — SODIUM CHLORIDE 0.9 % IV SOLN: Freq: Once | INTRAVENOUS | Status: AC

## 2021-07-03 MED ORDER — FAMOTIDINE IN NACL 20-0.9 MG/50ML-% IV SOLN
20.0000 mg | Freq: Once | INTRAVENOUS | Status: AC
  Administered 2021-07-03: 20 mg via INTRAVENOUS
  Filled 2021-07-03: qty 50

## 2021-07-03 MED ORDER — MONTELUKAST SODIUM 10 MG PO TABS
10.0000 mg | ORAL_TABLET | Freq: Once | ORAL | Status: AC
  Administered 2021-07-03: 10 mg via ORAL
  Filled 2021-07-03: qty 1

## 2021-07-03 MED ORDER — SODIUM CHLORIDE 0.9 % IV SOLN
16.0000 mg/kg | Freq: Once | INTRAVENOUS | Status: AC
  Administered 2021-07-03: 1600 mg via INTRAVENOUS
  Filled 2021-07-03: qty 80

## 2021-07-03 MED ORDER — SODIUM CHLORIDE 0.9 % IV SOLN
20.0000 mg | Freq: Once | INTRAVENOUS | Status: AC
  Administered 2021-07-03: 20 mg via INTRAVENOUS
  Filled 2021-07-03: qty 20

## 2021-07-03 MED ORDER — ACETAMINOPHEN 325 MG PO TABS
650.0000 mg | ORAL_TABLET | Freq: Once | ORAL | Status: AC
  Administered 2021-07-03: 650 mg via ORAL
  Filled 2021-07-03: qty 2

## 2021-07-03 MED ORDER — DIPHENHYDRAMINE HCL 25 MG PO CAPS
50.0000 mg | ORAL_CAPSULE | Freq: Once | ORAL | Status: AC
  Administered 2021-07-03: 50 mg via ORAL
  Filled 2021-07-03: qty 2

## 2021-07-03 NOTE — Progress Notes (Signed)
Ok to treat with Plt count of 98 today per Dr. Irene Limbo today

## 2021-07-03 NOTE — Patient Instructions (Signed)
[facility] MEDICAL ONCOLOGY  Discharge Instructions: °Thank you for choosing [facility] to provide your oncology and hematology care.  ° °If you have a lab appointment with the Cancer Center, please go directly to the Cancer Center and check in at the registration area. °  °Wear comfortable clothing and clothing appropriate for easy access to any Portacath or PICC line.  ° °We strive to give you quality time with your provider. You may need to reschedule your appointment if you arrive late (15 or more minutes).  Arriving late affects you and other patients whose appointments are after yours.  Also, if you miss three or more appointments without notifying the office, you may be dismissed from the clinic at the provider’s discretion.    °  °For prescription refill requests, have your pharmacy contact our office and allow 72 hours for refills to be completed.   ° °Today you received the following chemotherapy and/or immunotherapy agents: Daratumumab.     °  °To help prevent nausea and vomiting after your treatment, we encourage you to take your nausea medication as directed. ° °BELOW ARE SYMPTOMS THAT SHOULD BE REPORTED IMMEDIATELY: °*FEVER GREATER THAN 100.4 F (38 °C) OR HIGHER °*CHILLS OR SWEATING °*NAUSEA AND VOMITING THAT IS NOT CONTROLLED WITH YOUR NAUSEA MEDICATION °*UNUSUAL SHORTNESS OF BREATH °*UNUSUAL BRUISING OR BLEEDING °*URINARY PROBLEMS (pain or burning when urinating, or frequent urination) °*BOWEL PROBLEMS (unusual diarrhea, constipation, pain near the anus) °TENDERNESS IN MOUTH AND THROAT WITH OR WITHOUT PRESENCE OF ULCERS (sore throat, sores in mouth, or a toothache) °UNUSUAL RASH, SWELLING OR PAIN  °UNUSUAL VAGINAL DISCHARGE OR ITCHING  ° °Items with * indicate a potential emergency and should be followed up as soon as possible or go to the Emergency Department if any problems should occur. ° °Please show the CHEMOTHERAPY ALERT CARD or IMMUNOTHERAPY ALERT CARD at check-in  to the Emergency Department and triage nurse. ° °Should you have questions after your visit or need to cancel or reschedule your appointment, please contact [facility]ICAL [facility]COLOG[facility]  Dept: [phone]  and follow the prompts.  Office hours are 8:00 a.m. to 4:30 p.m. Monday - Friday. Please note that voicemails left after 4:00 p.m. may not be returned until the following business day.  We are closed weekends and major holidays. You have access to a nurse at all times for urgent questions. Please call the main number to the clinic Dept: [phone] and follow the prompts. ° ° °For any non-urgent questions, you may also contact your provider using MyChart. We now offer e-Visits for anyone 18 and older to request care online for non-urgent symptoms. For details visit [email or web address]. °  °Also download the MyChart app! Go to the app store, search "MyChart", open the app, select [facility], and log in with your MyChart username and password. ° °Due to Covid, a mask is required upon entering the hospital/clinic. If you do not have a mask, one will be given to you upon arrival. For doctor visits, patients may have 1 support person aged 18 or older with them. For treatment visits, patients cannot have anyone with them due to current Covid guidelines and our immunocompromised population.  ° °

## 2021-07-04 ENCOUNTER — Ambulatory Visit (INDEPENDENT_AMBULATORY_CARE_PROVIDER_SITE_OTHER): Payer: Medicare Other

## 2021-07-04 DIAGNOSIS — I495 Sick sinus syndrome: Secondary | ICD-10-CM

## 2021-07-05 LAB — CUP PACEART REMOTE DEVICE CHECK
Battery Impedance: 1525 Ohm
Battery Remaining Longevity: 46 mo
Battery Voltage: 2.74 V
Brady Statistic AP VP Percent: 25 %
Brady Statistic AP VS Percent: 0 %
Brady Statistic AS VP Percent: 59 %
Brady Statistic AS VS Percent: 15 %
Date Time Interrogation Session: 20230222123639
Implantable Lead Implant Date: 20050121
Implantable Lead Implant Date: 20050121
Implantable Lead Location: 753859
Implantable Lead Location: 753860
Implantable Lead Model: 5076
Implantable Lead Model: 5076
Implantable Pulse Generator Implant Date: 20140207
Lead Channel Impedance Value: 505 Ohm
Lead Channel Impedance Value: 655 Ohm
Lead Channel Pacing Threshold Amplitude: 0.5 V
Lead Channel Pacing Threshold Amplitude: 1 V
Lead Channel Pacing Threshold Pulse Width: 0.4 ms
Lead Channel Pacing Threshold Pulse Width: 0.4 ms
Lead Channel Setting Pacing Amplitude: 2.25 V
Lead Channel Setting Pacing Amplitude: 2.5 V
Lead Channel Setting Pacing Pulse Width: 0.4 ms
Lead Channel Setting Sensing Sensitivity: 2 mV

## 2021-07-10 ENCOUNTER — Telehealth: Payer: Self-pay | Admitting: Family Medicine

## 2021-07-10 NOTE — Telephone Encounter (Signed)
Patient calling in with respiratory symptoms: Shortness of breath, chest pain, palpitations or other red words send to Triage  Does the patient have a fever over 100, cough, congestion, sore throat, runny nose, lost of taste/smell (please list symptoms that patient has)?cough   What date did symptoms start?07-07-2021 (If over 5 days ago, pt may be scheduled for in person visit)  Have you tested for Covid in the last 5 days? Yes   If yes, was it positive []  OR negative [x] ? If positive in the last 5 days, please schedule virtual visit now. If negative, schedule for an in person OV with the next available provider if PCP has no openings. Please also let patient know they will be tested again (follow the script below)  "you will have to arrive 30mins prior to your appt time to be Covid tested. Please park in back of office at the cone & call [phone] to let the staff know you have arrived. A staff member will meet you at your car to do a rapid covid test. Once the test has resulted you will be notified by phone of your results to determine if appt will remain an in person visit or be converted to a virtual/phone visit. If you arrive less than 29mins before your appt time, your visit will be automatically converted to virtual & any recommended testing will happen AFTER the visit." Pt has an appt with dr banks on 07-13-2021 10 am THINGS TO REMEMBER  If no availability for virtual visit in office,  please schedule another [place] office  If no availability at another [place] office, please instruct patient that they can schedule an evisit or virtual visit through their mychart account. Visits up to 8pm  patients can be seen in office 5 days after positive COVID test

## 2021-07-11 NOTE — Progress Notes (Signed)
Remote pacemaker transmission.   

## 2021-07-12 ENCOUNTER — Other Ambulatory Visit: Payer: Self-pay | Admitting: Hematology

## 2021-07-12 DIAGNOSIS — C9000 Multiple myeloma not having achieved remission: Secondary | ICD-10-CM

## 2021-07-13 ENCOUNTER — Ambulatory Visit (INDEPENDENT_AMBULATORY_CARE_PROVIDER_SITE_OTHER): Payer: Medicare Other

## 2021-07-13 ENCOUNTER — Ambulatory Visit (INDEPENDENT_AMBULATORY_CARE_PROVIDER_SITE_OTHER): Payer: Medicare Other | Admitting: Family Medicine

## 2021-07-13 ENCOUNTER — Encounter: Payer: Self-pay | Admitting: Family Medicine

## 2021-07-13 ENCOUNTER — Other Ambulatory Visit: Payer: Self-pay

## 2021-07-13 VITALS — BP 149/78 | HR 70 | Temp 98.0°F | Wt 201.0 lb

## 2021-07-13 DIAGNOSIS — J3489 Other specified disorders of nose and nasal sinuses: Secondary | ICD-10-CM

## 2021-07-13 DIAGNOSIS — R052 Subacute cough: Secondary | ICD-10-CM

## 2021-07-13 DIAGNOSIS — R062 Wheezing: Secondary | ICD-10-CM | POA: Diagnosis not present

## 2021-07-13 DIAGNOSIS — R059 Cough, unspecified: Secondary | ICD-10-CM | POA: Diagnosis not present

## 2021-07-13 MED ORDER — DOXYCYCLINE HYCLATE 100 MG PO TABS: 100.0000 mg | ORAL_TABLET | Freq: Two times a day (BID) | ORAL | 0 refills | Status: AC

## 2021-07-13 MED ORDER — AMOXICILLIN-POT CLAVULANATE 875-125 MG PO TABS: 1.0000 | ORAL_TABLET | Freq: Two times a day (BID) | ORAL | 0 refills | Status: DC

## 2021-07-13 NOTE — Progress Notes (Signed)
Subjective:  ? ? Patient ID: Jonathon [name], male    DOB: Apr 21, 1936, 86 y.o.   MRN: [id number] ? ?Chief Complaint  ?Patient presents with  ? Cough  ?  For 6 das with mucus buildup in throat. Has been taking cough syrup twice a day but does not feel it has helped any   ?Pt accompanied by his daughter, [name]. ? ?HPI ?Patient was seen today for acute concern.  Pt with cough x 6 days.  Initially cough started 2 wks ago, improved, then returned.  Cough is deep, productive, and worse at night.  Pt with intermittent wheezing, rhinorrhea, and decreased appetite.  Pt denies ST, HA, fever, chills.  Tried honey and lemon for symptoms.  Has a h/o asthma.  Pt [facility] in [place].   ? ?Patient's daughter request patient's hearing be checked.  Pt has a h/o decreased hearing right greater than left. ? ?Past Medical History:  ?Diagnosis Date  ? Asthma   ? BENIGN PROSTATIC HYPERTROPHY, HX OF 10/25/2008  ? Bone metastases ([place]) 04/14/2018  ? BRADYCARDIA 2005  ? Chronic diastolic congestive heart failure ([place]) 06/05/2016  ? CIDP (chronic inflammatory demyelinating polyneuropathy) ([facility])   ? HYPERTENSION 11/21/2006  ? Iron deficiency anemia, unspecified 04/19/2013  ? Multiple myeloma ([place]) 04/29/2018  ? NEPHROLITHIASIS, HX OF 11/21/2006 and 10/15/2008  ? PACEMAKER, PERMANENT 2005  ? Gen change 2014 Medtronic Adaptic L dual-chamber pacemaker, serial [id number]   ? SVT (supraventricular tachycardia) ([place])   ? TOBACCO ABUSE 10/25/2008  ? Quit 2012  ? ? ?Allergies  ?Allergen Reactions  ? Lexapro [Escitalopram Oxalate]   ?  3/30-08/16/17 ? Serotonin Syndrome  ? ? ?ROS ?General: Denies fever, chills, night sweats, changes in weight  + changes in appetite ?HEENT: Denies headaches, ear pain, changes in vision, sore throat  + rhinorrhea, decreased hearing ?CV: Denies CP, palpitations, SOB, orthopnea ?Pulm: Denies SOB, wheezing + productive cough ?GI: Denies abdominal pain, nausea, vomiting, diarrhea, constipation ?GU: Denies dysuria,  hematuria, frequency, vaginal discharge ?Msk: Denies muscle cramps, joint pains ?Neuro: Denies weakness, numbness, tingling ?Skin: Denies rashes, bruising ?Psych: Denies depression, anxiety, hallucinations ? ? ?   ?Objective:  ?  ?Blood pressure (!) 149/78, pulse 70, temperature 98 ?F (36.7 ?C), temperature source Oral, weight 201 lb (91.2 kg), SpO2 100 %. ? ?Gen. Pleasant, well-nourished, in no distress, normal affect.  HOH R>L. ?HEENT: [place]/AT, face symmetric, conjunctiva clear, no scleral icterus, PERRLA, EOMI, nares patent without drainage, pharynx without erythema or exudate.  TMs normal bilaterally.  No cervical lymphadenopathy. ?Lungs: no accessory muscle use, decreased breath sounds in left lower base.  Faint rhonchi in right lower base.  No wheezing or rales. ?Cardiovascular: RRR, no m/r/g, no peripheral edema ?Musculoskeletal: No deformities, no cyanosis or clubbing, normal tone ?Neuro:  A&Ox3, CN II-XII intact, normal gait ?Skin:  Warm, no lesions/ rash ? ? ?Wt Readings from Last 3 Encounters:  ?07/13/21 201 lb (91.2 kg)  ?07/03/21 204 lb 8 oz (92.8 kg)  ?06/19/21 203 lb (92.1 kg)  ? ? ?Lab Results  ?Component Value Date  ? WBC 4.0 07/03/2021  ? HGB 11.8 (L) 07/03/2021  ? HCT 36.4 (L) 07/03/2021  ? PLT 98 (L) 07/03/2021  ? GLUCOSE 105 (H) 07/03/2021  ? CHOL 202 (H) 01/14/2013  ? TRIG 206.0 (H) 01/14/2013  ? HDL 53.60 01/14/2013  ? LDLDIRECT 126.6 01/14/2013  ? ALT 7 07/03/2021  ? AST 12 (L) 07/03/2021  ? NA 136 07/03/2021  ? K 3.8  07/03/2021  ? CL 101 07/03/2021  ? CREATININE 1.11 07/03/2021  ? BUN 17 07/03/2021  ? CO2 31 07/03/2021  ? TSH 1.28 01/08/2017  ? PSA 1.53 12/01/2008  ? INR 1.12 04/17/2018  ? HGBA1C 6.0 11/05/2013  ? ? ?Assessment/Plan: ? ?Subacute cough ?-Symptoms possibly 2/2 viral etiology, seasonal allergies, bacterial infection. ?-Given continued productive cough with intermittent wheezing x2 weeks obtain CXR to r/o pna. ?-will start empiric ABX for possible pneumonia. ?-Discussed OTC  cough/cold medications or antihistamine ?-Given strict precautions ?- Plan: DG Chest 2 View, amoxicillin-clavulanate (AUGMENTIN) 875-125 MG tablet, doxycycline (VIBRA-TABS) 100 MG tablet ? ?Rhinorrhea ?-OTC antihistamine, saline nasal rinse, Flonase nasal spray ? ?F/u as needed ? ?[name], MD ?

## 2021-07-17 ENCOUNTER — Other Ambulatory Visit: Payer: Self-pay

## 2021-07-17 ENCOUNTER — Other Ambulatory Visit: Payer: Medicare Other

## 2021-07-17 ENCOUNTER — Inpatient Hospital Stay (HOSPITAL_BASED_OUTPATIENT_CLINIC_OR_DEPARTMENT_OTHER): Payer: Medicare Other | Admitting: Hematology

## 2021-07-17 ENCOUNTER — Inpatient Hospital Stay: Payer: Medicare Other | Attending: Hematology

## 2021-07-17 ENCOUNTER — Inpatient Hospital Stay: Payer: Medicare Other

## 2021-07-17 ENCOUNTER — Ambulatory Visit: Payer: Medicare Other

## 2021-07-17 VITALS — BP 157/82 | HR 72 | Temp 97.7°F | Resp 18 | Wt 196.9 lb

## 2021-07-17 DIAGNOSIS — I10 Essential (primary) hypertension: Secondary | ICD-10-CM | POA: Diagnosis not present

## 2021-07-17 DIAGNOSIS — Z5112 Encounter for antineoplastic immunotherapy: Secondary | ICD-10-CM | POA: Diagnosis not present

## 2021-07-17 DIAGNOSIS — C9002 Multiple myeloma in relapse: Secondary | ICD-10-CM | POA: Insufficient documentation

## 2021-07-17 DIAGNOSIS — D709 Neutropenia, unspecified: Secondary | ICD-10-CM | POA: Diagnosis not present

## 2021-07-17 DIAGNOSIS — Z87891 Personal history of nicotine dependence: Secondary | ICD-10-CM | POA: Insufficient documentation

## 2021-07-17 DIAGNOSIS — C7951 Secondary malignant neoplasm of bone: Secondary | ICD-10-CM | POA: Insufficient documentation

## 2021-07-17 DIAGNOSIS — Z803 Family history of malignant neoplasm of breast: Secondary | ICD-10-CM | POA: Diagnosis not present

## 2021-07-17 DIAGNOSIS — J069 Acute upper respiratory infection, unspecified: Secondary | ICD-10-CM | POA: Insufficient documentation

## 2021-07-17 DIAGNOSIS — Z8041 Family history of malignant neoplasm of ovary: Secondary | ICD-10-CM | POA: Insufficient documentation

## 2021-07-17 DIAGNOSIS — M79674 Pain in right toe(s): Secondary | ICD-10-CM | POA: Diagnosis not present

## 2021-07-17 DIAGNOSIS — B351 Tinea unguium: Secondary | ICD-10-CM | POA: Diagnosis not present

## 2021-07-17 DIAGNOSIS — M79675 Pain in left toe(s): Secondary | ICD-10-CM | POA: Diagnosis not present

## 2021-07-17 LAB — CBC WITH DIFFERENTIAL/PLATELET
Abs Immature Granulocytes: 0.01 10*3/uL (ref 0.00–0.07)
Basophils Absolute: 0.1 10*3/uL (ref 0.0–0.1)
Basophils Relative: 3 %
Eosinophils Absolute: 0.3 10*3/uL (ref 0.0–0.5)
Eosinophils Relative: 10 %
HCT: 34.4 % — ABNORMAL LOW (ref 39.0–52.0)
Hemoglobin: 11.6 g/dL — ABNORMAL LOW (ref 13.0–17.0)
Immature Granulocytes: 0 %
Lymphocytes Relative: 34 %
Lymphs Abs: 1 10*3/uL (ref 0.7–4.0)
MCH: 28.8 pg (ref 26.0–34.0)
MCHC: 33.7 g/dL (ref 30.0–36.0)
MCV: 85.4 fL (ref 80.0–100.0)
Monocytes Absolute: 0.4 10*3/uL (ref 0.1–1.0)
Monocytes Relative: 12 %
Neutro Abs: 1.2 10*3/uL — ABNORMAL LOW (ref 1.7–7.7)
Neutrophils Relative %: 41 %
Platelets: 162 10*3/uL (ref 150–400)
RBC: 4.03 MIL/uL — ABNORMAL LOW (ref 4.22–5.81)
RDW: 14.7 % (ref 11.5–15.5)
WBC: 2.9 10*3/uL — ABNORMAL LOW (ref 4.0–10.5)
nRBC: 0 % (ref 0.0–0.2)

## 2021-07-17 LAB — CMP (CANCER CENTER ONLY)
ALT: 11 U/L (ref 0–44)
AST: 12 U/L — ABNORMAL LOW (ref 15–41)
Albumin: 3.6 g/dL (ref 3.5–5.0)
Alkaline Phosphatase: 96 U/L (ref 38–126)
Anion gap: 8 (ref 5–15)
BUN: 13 mg/dL (ref 8–23)
CO2: 26 mmol/L (ref 22–32)
Calcium: 9.3 mg/dL (ref 8.9–10.3)
Chloride: 104 mmol/L (ref 98–111)
Creatinine: 1.04 mg/dL (ref 0.61–1.24)
GFR, Estimated: >60 mL/min (ref >60)
Glucose, Bld: 121 mg/dL — ABNORMAL HIGH (ref 70–99)
Potassium: 3.8 mmol/L (ref 3.5–5.1)
Sodium: 138 mmol/L (ref 135–145)
Total Bilirubin: 0.5 mg/dL (ref 0.3–1.2)
Total Protein: 6.8 g/dL (ref 6.5–8.1)

## 2021-07-18 ENCOUNTER — Telehealth: Payer: Self-pay | Admitting: Hematology

## 2021-07-18 NOTE — Telephone Encounter (Signed)
Scheduled follow-up appointment per 3/7 los. Patient's daughter is aware. ?

## 2021-07-19 LAB — MULTIPLE MYELOMA PANEL, SERUM
Albumin SerPl Elph-Mcnc: 2.8 g/dL — ABNORMAL LOW (ref 2.9–4.4)
Albumin/Glob SerPl: 0.9 (ref 0.7–1.7)
Alpha 1: 0.4 g/dL (ref 0.0–0.4)
Alpha2 Glob SerPl Elph-Mcnc: 1.1 g/dL — ABNORMAL HIGH (ref 0.4–1.0)
B-Globulin SerPl Elph-Mcnc: 1.1 g/dL (ref 0.7–1.3)
Gamma Glob SerPl Elph-Mcnc: 0.6 g/dL (ref 0.4–1.8)
Globulin, Total: 3.2 g/dL (ref 2.2–3.9)
IgA: 248 mg/dL (ref 61–437)
IgG (Immunoglobin G), Serum: 668 mg/dL (ref 603–1613)
IgM (Immunoglobulin M), Srm: 57 mg/dL (ref 15–143)
Total Protein ELP: 6 g/dL (ref 6.0–8.5)

## 2021-07-23 ENCOUNTER — Other Ambulatory Visit: Payer: Self-pay

## 2021-07-23 DIAGNOSIS — C9000 Multiple myeloma not having achieved remission: Secondary | ICD-10-CM

## 2021-07-23 MED ORDER — REVLIMID 10 MG PO CAPS: ORAL_CAPSULE | ORAL | 0 refills | Status: DC

## 2021-07-24 ENCOUNTER — Encounter: Payer: Self-pay | Admitting: Hematology

## 2021-07-24 NOTE — Progress Notes (Signed)
? ? ?HEMATOLOGY/ONCOLOGY CLINIC NOTE ? ?Date of Service: .07/17/2021 ? ? ?Patient Care Team: ?[name], MD as PCP - General (Family Medicine) ?[name], MD (Cardiology) ?[name], MD (Cardiology) ?[name], DO as Consulting Physician (Neurology) ? ?CHIEF COMPLAINTS/PURPOSE OF CONSULTATION:  ?Continue evaluation and management of multiple myeloma. ? ?HISTORY OF PRESENTING ILLNESS:  ?Please see previous note for details on initial presentation. ? ?INTERVAL HISTORY:  ? ?Jonathon [name]  is here for continued evaluation and management of his multiple myeloma ?He notes he has an upper respite tract infection and is gradually healing from this. ?He is somewhat neutropenic today and given his resolving upper respiratory infection we decided to hold the daratumumab dose today and to hold the Revlimid for 1 week. ?No other focal bone pains. ?Labs done today were reviewed in details with him. ?Continues to be off Xgeva pending dental clearance. ? ?MEDICAL HISTORY:  ?Past Medical History:  ?Diagnosis Date  ? Asthma   ? BENIGN PROSTATIC HYPERTROPHY, HX OF 10/25/2008  ? Bone metastases ([place]) 04/14/2018  ? BRADYCARDIA 2005  ? Chronic diastolic congestive heart failure ([place]) 06/05/2016  ? CIDP (chronic inflammatory demyelinating polyneuropathy) (H[facility])   ? HYPERTENSION 11/21/2006  ? Iron deficiency anemia, unspecified 04/19/2013  ? Multiple myeloma ([place]) 04/29/2018  ? NEPHROLITHIASIS, HX OF 11/21/2006 and 10/15/2008  ? PACEMAKER, PERMANENT 2005  ? Gen change 2014 Medtronic Adaptic L dual-chamber pacemaker, serial [id number]   ? SVT (supraventricular tachycardia) ([place])   ? TOBACCO ABUSE 10/25/2008  ? Quit 2012  ? ? ?SURGICAL HISTORY: ?Past Surgical History:  ?Procedure Laterality Date  ? COLONOSCOPY  [id number]  ? PACEMAKER INSERTION  05/14/2003  ? PERMANENT PACEMAKER GENERATOR CHANGE N/A 06/19/2012  ? Procedure: PERMANENT PACEMAKER GENERATOR CHANGE;  Surgeon: [name], MD; Medtronic Adaptic L dual-chamber  pacemaker, serial #[id number]    ? SKIN GRAFT Right 05/13/1960  ? wrist  ? TOOTH EXTRACTION  11/10/2020  ? 3 teeth removed  ? ? ?SOCIAL HISTORY: ?Social History  ? ?Socioeconomic History  ? Marital status: Divorced  ?  Spouse name: Not on file  ? Number of children: 4  ? Years of education: Not on file  ? Highest education level: Not on file  ?Occupational History  ? Occupation: ENVIROMENTAL SERVICE  ?  Employer: [employer]  ? Occupation: retired  ?Tobacco Use  ? Smoking status: Former  ?  Packs/day: 0.25  ?  Years: 46.00  ?  Pack years: 11.50  ?  Types: Cigarettes  ?  Start date: 03/16/1965  ?  Quit date: 06/15/2010  ?  Years since quitting: 11.1  ? Smokeless tobacco: Never  ? Tobacco comments:  ?  quit 3 years ago  ?Vaping Use  ? Vaping Use: Never used  ?Substance and Sexual Activity  ? Alcohol use: Yes  ?  Comment: Occasional wine with dinner  ? Drug use: No  ? Sexual activity: Not on file  ?Other Topics Concern  ? Not on file  ?Social History Narrative  ? 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 ?Social Determinants of Health  ? ?Financial Resource Strain: Low Risk   ? Difficulty of Paying Living Expenses: Not hard at all  ?Food Insecurity: No Food Insecurity  ? Worried About Charity fundraiser in the Last Year: Never true  ? Ran Out of Food in the Last Year: Never true  ?Transportation Needs: No  Transportation Needs  ? Lack of Transportation (Medical): No  ? Lack of Transportation (Non-Medical): No  ?Physical Activity: Not on file  ?Stress: Not on file  ?Social Connections: Unknown  ? Frequency of Communication with Friends and Family: More than three times a week  ? Frequency of Social Gatherings with Friends and Family: More than three times a week  ? Attends Religious Services: Not on file  ? Active Member of Clubs or Organizations: Not on file  ? Attends Archivist Meetings: Not on file  ? Marital Status: Not on file  ?Intimate  Partner Violence: Not on file  ? ? ?FAMILY HISTORY: ?Family History  ?Problem Relation Age of Onset  ? Heart attack Father   ?     Died, 65  ? Kidney disease Father   ? Parkinson's disease Mother   ?     Died, 86  ? Breast cancer Sister   ?     Living, 70  ? Diabetes type II Brother   ?     Living, 67  ? Breast cancer Sister   ?     Living, 64  ? Diabetes Daughter   ?     Living, 51  ? Diabetes Son   ?     Living, 50  ? Ovarian cancer Daughter   ? Colon cancer Neg Hx   ? Rectal cancer Neg Hx   ? Stomach cancer Neg Hx   ? ? ?ALLERGIES:  is allergic to lexapro [escitalopram oxalate]. ? ?MEDICATIONS:  ?Current Outpatient Medications  ?Medication Sig Dispense Refill  ? acyclovir (ZOVIRAX) 400 MG tablet Take 1 tablet (400 mg total) by mouth 2 (two) times daily. 60 tablet 11  ? amoxicillin-clavulanate (AUGMENTIN) 875-125 MG tablet Take 1 tablet by mouth 2 (two) times daily. 20 tablet 0  ? aspirin EC 81 MG tablet Take 81 mg by mouth daily.    ? diltiazem (TIAZAC) 240 MG 24 hr capsule Take 1 capsule by mouth daily.    ? flecainide (TAMBOCOR) 50 MG tablet Take 75 mg by mouth daily as needed.    ? furosemide (LASIX) 40 MG tablet Take 40 mg by mouth daily.    ? irbesartan (AVAPRO) 300 MG tablet Take 300 mg by mouth daily.    ? memantine (NAMENDA) 5 MG tablet Take 5 mg by mouth 2 (two) times daily.    ? metoprolol tartrate (LOPRESSOR) 50 MG tablet Take 1 tablet (50 mg total) by mouth 2 (two) times daily. 60 tablet 0  ? ondansetron (ZOFRAN) 8 MG tablet Take 1 tablet (8 mg total) by mouth 2 (two) times daily as needed (Nausea or vomiting). 30 tablet 1  ? polyethylene glycol (MIRALAX / GLYCOLAX) packet Take 17 g by mouth daily. 14 each 0  ? prochlorperazine (COMPAZINE) 10 MG tablet Take 1 tablet (10 mg total) by mouth every 6 (six) hours as needed (Nausea or vomiting). 30 tablet 1  ? REVLIMID 10 MG capsule TAKE 1 CAPSULE BY MOUTH ONCE DAILY FOR 21 DAYS ON AND 7 DAYS OFF 21 capsule 0  ? Saccharomyces boulardii (FLORASTOR PO) Take  250 mg by mouth 2 (two) times daily.    ? tamsulosin (FLOMAX) 0.4 MG CAPS capsule Take 2 capsules (0.8 mg total) by mouth daily. 90 capsule 0  ? traMADol (ULTRAM) 50 MG tablet Take 1 tablet (50 mg total) by mouth every 6 (six) hours as needed. 30 tablet 0  ? ?No current facility-administered medications for this visit.  ? ?  Facility-Administered Medications Ordered in Other Visits  ?Medication Dose Route Frequency Provider Last Rate Last Admin  ? [MAR Hold] sodium chloride flush (NS) 0.9 % injection 10 mL  10 mL Intracatheter PRN Brunetta Genera, MD      ? ? ?REVIEW OF SYSTEMS:   ?10 Point review of Systems was done is negative except as noted above. ? ?PHYSICAL EXAMINATION: ?ECOG PERFORMANCE STATUS: 2 - Symptomatic, <50% confined to bed  ?NAD ?GENERAL:alert, in no acute distress and comfortable ?SKIN: no acute rashes, no significant lesions ?EYES: conjunctiva are pink and non-injected, sclera anicteric ?OROPHARYNX: MMM, no exudates, no oropharyngeal erythema or ulceration ?NECK: supple, no JVD ?LYMPH:  no palpable lymphadenopathy in the cervical, axillary or inguinal regions ?LUNGS: clear to auscultation b/l with normal respiratory effort ?HEART: regular rate & rhythm ?ABDOMEN:  normoactive bowel sounds , non tender, not distended. ?Extremity: no pedal edema ?PSYCH: alert & oriented x 3 with fluent speech ?NEURO: no focal motor/sensory deficits ? ?LABORATORY DATA:  ?I have reviewed the data as listed ?Marland Kitchen ?CBC Latest Ref Rng & Units 07/17/2021 07/03/2021 06/19/2021  ?WBC 4.0 - 10.5 K/uL 2.9(L) 4.0 3.8(L)  ?Hemoglobin 13.0 - 17.0 g/dL 11.6(L) 11.8(L) 11.7(L)  ?Hematocrit 39.0 - 52.0 % 34.4(L) 36.4(L) 36.1(L)  ?Platelets 150 - 400 K/uL 162 98(L) 80(L)  ? ?[place] 1.2k ? ?CMP Latest Ref Rng & Units 07/17/2021 07/03/2021 06/19/2021  ?Glucose 70 - 99 mg/dL 121(H) 105(H) 95  ?BUN 8 - 23 mg/dL _0 ?Creatinine 0.61 - 1.24 mg/dL 1.04 1.11 1.10  ?Sodium 135 - 145 mmol/L 138 136 139  ?Potassium 3.5 - 5.1 mmol/L 3.8 3.8 3.7   ?Chloride 98 - 111 mmol/L 104 101 103  ?CO2 22 - 32 mmol/L _1 ?Calcium 8.9 - 10.3 mg/dL 9.3 8.9 9.1  ?Total Protein 6.5 - 8.1 g/dL 6.8 6.4(L) 6.5  ?Total Bilirubin 0.3 - 1.2 mg/dL 0.5 0.8 1.0  ?Alkaline Phos 38

## 2021-07-31 ENCOUNTER — Ambulatory Visit: Payer: Medicare Other

## 2021-07-31 ENCOUNTER — Other Ambulatory Visit: Payer: Medicare Other

## 2021-08-06 ENCOUNTER — Ambulatory Visit (HOSPITAL_COMMUNITY)
Admission: RE | Admit: 2021-08-06 | Discharge: 2021-08-06 | Disposition: A | Discharge status: Home / Self Care | Payer: Medicare Other | Source: Ambulatory Visit | Attending: Hematology | Admitting: Hematology

## 2021-08-06 ENCOUNTER — Other Ambulatory Visit: Payer: Self-pay

## 2021-08-06 DIAGNOSIS — C9002 Multiple myeloma in relapse: Secondary | ICD-10-CM | POA: Insufficient documentation

## 2021-08-06 DIAGNOSIS — C9000 Multiple myeloma not having achieved remission: Secondary | ICD-10-CM | POA: Diagnosis not present

## 2021-08-06 LAB — GLUCOSE, CAPILLARY: Glucose-Capillary: 108 mg/dL — ABNORMAL HIGH (ref 70–99)

## 2021-08-06 MED ORDER — FLUDEOXYGLUCOSE F - 18 (FDG) INJECTION
9.7400 | Freq: Once | INTRAVENOUS | Status: AC | PRN
  Administered 2021-08-06: 9.74 via INTRAVENOUS

## 2021-08-07 ENCOUNTER — Inpatient Hospital Stay (HOSPITAL_BASED_OUTPATIENT_CLINIC_OR_DEPARTMENT_OTHER): Payer: Medicare Other | Admitting: Hematology

## 2021-08-07 ENCOUNTER — Inpatient Hospital Stay: Payer: Medicare Other

## 2021-08-07 VITALS — BP 142/93 | HR 64 | Temp 97.7°F | Resp 20 | Wt 202.3 lb

## 2021-08-07 DIAGNOSIS — C7951 Secondary malignant neoplasm of bone: Secondary | ICD-10-CM | POA: Diagnosis not present

## 2021-08-07 DIAGNOSIS — Z87891 Personal history of nicotine dependence: Secondary | ICD-10-CM | POA: Diagnosis not present

## 2021-08-07 DIAGNOSIS — C9002 Multiple myeloma in relapse: Secondary | ICD-10-CM | POA: Diagnosis not present

## 2021-08-07 DIAGNOSIS — D709 Neutropenia, unspecified: Secondary | ICD-10-CM | POA: Diagnosis not present

## 2021-08-07 DIAGNOSIS — R918 Other nonspecific abnormal finding of lung field: Secondary | ICD-10-CM | POA: Diagnosis not present

## 2021-08-07 DIAGNOSIS — I10 Essential (primary) hypertension: Secondary | ICD-10-CM | POA: Diagnosis not present

## 2021-08-07 DIAGNOSIS — J069 Acute upper respiratory infection, unspecified: Secondary | ICD-10-CM | POA: Diagnosis not present

## 2021-08-07 LAB — CBC WITH DIFFERENTIAL/PLATELET
Abs Immature Granulocytes: 0.01 10*3/uL (ref 0.00–0.07)
Basophils Absolute: 0 10*3/uL (ref 0.0–0.1)
Basophils Relative: 1 %
Eosinophils Absolute: 0.4 10*3/uL (ref 0.0–0.5)
Eosinophils Relative: 12 %
HCT: 34.2 % — ABNORMAL LOW (ref 39.0–52.0)
Hemoglobin: 11.4 g/dL — ABNORMAL LOW (ref 13.0–17.0)
Immature Granulocytes: 0 %
Lymphocytes Relative: 32 %
Lymphs Abs: 1.1 10*3/uL (ref 0.7–4.0)
MCH: 29 pg (ref 26.0–34.0)
MCHC: 33.3 g/dL (ref 30.0–36.0)
MCV: 87 fL (ref 80.0–100.0)
Monocytes Absolute: 0.4 10*3/uL (ref 0.1–1.0)
Monocytes Relative: 12 %
Neutro Abs: 1.5 10*3/uL — ABNORMAL LOW (ref 1.7–7.7)
Neutrophils Relative %: 43 %
Platelets: 111 10*3/uL — ABNORMAL LOW (ref 150–400)
RBC: 3.93 MIL/uL — ABNORMAL LOW (ref 4.22–5.81)
RDW: 15.1 % (ref 11.5–15.5)
WBC: 3.4 10*3/uL — ABNORMAL LOW (ref 4.0–10.5)
nRBC: 0 % (ref 0.0–0.2)

## 2021-08-07 LAB — CMP (CANCER CENTER ONLY)
ALT: 9 U/L (ref 0–44)
AST: 12 U/L — ABNORMAL LOW (ref 15–41)
Albumin: 3.5 g/dL (ref 3.5–5.0)
Alkaline Phosphatase: 108 U/L (ref 38–126)
Anion gap: 6 (ref 5–15)
BUN: 18 mg/dL (ref 8–23)
CO2: 30 mmol/L (ref 22–32)
Calcium: 9 mg/dL (ref 8.9–10.3)
Chloride: 103 mmol/L (ref 98–111)
Creatinine: 1.06 mg/dL (ref 0.61–1.24)
GFR, Estimated: >60 mL/min (ref >60)
Glucose, Bld: 123 mg/dL — ABNORMAL HIGH (ref 70–99)
Potassium: 3.5 mmol/L (ref 3.5–5.1)
Sodium: 139 mmol/L (ref 135–145)
Total Bilirubin: 0.7 mg/dL (ref 0.3–1.2)
Total Protein: 6.2 g/dL — ABNORMAL LOW (ref 6.5–8.1)

## 2021-08-07 NOTE — Progress Notes (Signed)
? ? ?HEMATOLOGY/ONCOLOGY CLINIC NOTE ? ?Date of Service: 08/07/2021 ? ? ?Patient Care Team: ?[name], MD as PCP - General (Family Medicine) ?[name], MD (Cardiology) ?[name], MD (Cardiology) ?[name], DO as Consulting Physician (Neurology) ? ?CHIEF COMPLAINTS/PURPOSE OF CONSULTATION:  ?Continue evaluation and management of multiple myeloma. ? ?HISTORY OF PRESENTING ILLNESS:  ?Please see previous note for details on initial presentation. ? ?INTERVAL HISTORY:  ? ?Jonathon [name]  is here for continued evaluation and management of his multiple myeloma. He reports he is doing well with no new symptoms or concerns. ? ?He reports that his previous upper respite tract infection hs resolved. ? ?He notes that he has been taking miralax to assist with bowel movements. ? ?He notes that he is not able to stand for extended periods of time due to some back pain. ? ?Neutropenia has improved slightly since previous visit with counts of 1.5K/uL from 1.2K/uL. ? ?He will continue his daratumumab dose and Revlimid and pneumonia is resolving. ? ?No fever, chills, night sweats. ?No new lumps, bumps, or lesions/rashes. ?No abdominal pain or change in bowel habits. ?No new or unexpected weight loss. ?No SOB or chest pain. ?No other new or acute focal symptoms. ?No other focal bone pains. ? ?Labs done today were reviewed in detail. Multiple myeloma panel shows M protein spike not observed. ?Continues to be off Xgeva pending dental clearance. ? ?MEDICAL HISTORY:  ?Past Medical History:  ?Diagnosis Date  ? Asthma   ? BENIGN PROSTATIC HYPERTROPHY, HX OF 10/25/2008  ? Bone metastases ([place]) 04/14/2018  ? BRADYCARDIA 2005  ? Chronic diastolic congestive heart failure ([place]) 06/05/2016  ? CIDP (chronic inflammatory demyelinating polyneuropathy) (HCC)   ? HYPERTENSION 11/21/2006  ? Iron deficiency anemia, unspecified 04/19/2013  ? Multiple myeloma ([place]) 04/29/2018  ? NEPHROLITHIASIS, HX OF 11/21/2006 and 10/15/2008  ?  PACEMAKER, PERMANENT 2005  ? Gen change 2014 Medtronic Adaptic L dual-chamber pacemaker, serial [id number]   ? SVT (supraventricular tachycardia) ([place])   ? TOBACCO ABUSE 10/25/2008  ? Quit 2012  ? ? ?SURGICAL HISTORY: ?Past Surgical History:  ?Procedure Laterality Date  ? COLONOSCOPY  [id number]  ? PACEMAKER INSERTION  05/14/2003  ? PERMANENT PACEMAKER GENERATOR CHANGE N/A 06/19/2012  ? Procedure: PERMANENT PACEMAKER GENERATOR CHANGE;  Surgeon: [name], MD; Medtronic Adaptic L dual-chamber pacemaker, serial #[id number]    ? SKIN GRAFT Right 05/13/1960  ? wrist  ? TOOTH EXTRACTION  11/10/2020  ? 3 teeth removed  ? ? ?SOCIAL HISTORY: ?Social History  ? ?Socioeconomic History  ? Marital status: Divorced  ?  Spouse name: Not on file  ? Number of children: 4  ? Years of education: Not on file  ? Highest education level: Not on file  ?Occupational History  ? Occupation: ENVIROMENTAL SERVICE  ?  Employer: [employer]  ? Occupation: retired  ?Tobacco Use  ? Smoking status: Former  ?  Packs/day: 0.25  ?  Years: 46.00  ?  Pack years: 11.50  ?  Types: Cigarettes  ?  Start date: 03/16/1965  ?  Quit date: 06/15/2010  ?  Years since quitting: 11.1  ? Smokeless tobacco: Never  ? Tobacco comments:  ?  quit 3 years ago  ?Vaping Use  ? Vaping Use: Never used  ?Substance and Sexual Activity  ? Alcohol use: Yes  ?  Comment: Occasional wine with dinner  ? Drug use: No  ? Sexual activity: Not on file  ?Other Topics Concern  ?  Not on file  ?Social History Narrative  ? 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 ?Social Determinants of Health  ? ?Financial Resource Strain: Low Risk   ? Difficulty of Paying Living Expenses: Not hard at all  ?Food Insecurity: No Food Insecurity  ? Worried About Charity fundraiser in the Last Year: Never true  ? Ran Out of Food in the Last Year: Never true  ?Transportation Needs: No Transportation Needs  ? Lack of Transportation (Medical): No   ? Lack of Transportation (Non-Medical): No  ?Physical Activity: Not on file  ?Stress: Not on file  ?Social Connections: Unknown  ? Frequency of Communication with Friends and Family: More than three times a week  ? Frequency of Social Gatherings with Friends and Family: More than three times a week  ? Attends Religious Services: Not on file  ? Active Member of Clubs or Organizations: Not on file  ? Attends Archivist Meetings: Not on file  ? Marital Status: Not on file  ?Intimate Partner Violence: Not on file  ? ? ?FAMILY HISTORY: ?Family History  ?Problem Relation Age of Onset  ? Heart attack Father   ?     Died, 65  ? Kidney disease Father   ? Parkinson's disease Mother   ?     Died, 86  ? Breast cancer Sister   ?     Living, 70  ? Diabetes type II Brother   ?     Living, 67  ? Breast cancer Sister   ?     Living, 64  ? Diabetes Daughter   ?     Living, 51  ? Diabetes Son   ?     Living, 50  ? Ovarian cancer Daughter   ? Colon cancer Neg Hx   ? Rectal cancer Neg Hx   ? Stomach cancer Neg Hx   ? ? ?ALLERGIES:  is allergic to lexapro [escitalopram oxalate]. ? ?MEDICATIONS:  ?Current Outpatient Medications  ?Medication Sig Dispense Refill  ? acyclovir (ZOVIRAX) 400 MG tablet Take 1 tablet (400 mg total) by mouth 2 (two) times daily. 60 tablet 11  ? amoxicillin-clavulanate (AUGMENTIN) 875-125 MG tablet Take 1 tablet by mouth 2 (two) times daily. 20 tablet 0  ? aspirin EC 81 MG tablet Take 81 mg by mouth daily.    ? diltiazem (TIAZAC) 240 MG 24 hr capsule Take 1 capsule by mouth daily.    ? flecainide (TAMBOCOR) 50 MG tablet Take 75 mg by mouth daily as needed.    ? furosemide (LASIX) 40 MG tablet Take 40 mg by mouth daily.    ? irbesartan (AVAPRO) 300 MG tablet Take 300 mg by mouth daily.    ? memantine (NAMENDA) 5 MG tablet Take 5 mg by mouth 2 (two) times daily.    ? metoprolol tartrate (LOPRESSOR) 50 MG tablet Take 1 tablet (50 mg total) by mouth 2 (two) times daily. 60 tablet 0  ? ondansetron (ZOFRAN) 8  MG tablet Take 1 tablet (8 mg total) by mouth 2 (two) times daily as needed (Nausea or vomiting). 30 tablet 1  ? polyethylene glycol (MIRALAX / GLYCOLAX) packet Take 17 g by mouth daily. 14 each 0  ? prochlorperazine (COMPAZINE) 10 MG tablet Take 1 tablet (10 mg total) by mouth every 6 (six) hours as needed (Nausea or vomiting). 30 tablet 1  ? REVLIMID 10 MG capsule TAKE 1 CAPSULE BY  MOUTH ONCE DAILY FOR 21 DAYS ON AND 7 DAYS OFF 21 capsule 0  ? Saccharomyces boulardii (FLORASTOR PO) Take 250 mg by mouth 2 (two) times daily.    ? tamsulosin (FLOMAX) 0.4 MG CAPS capsule Take 2 capsules (0.8 mg total) by mouth daily. 90 capsule 0  ? traMADol (ULTRAM) 50 MG tablet Take 1 tablet (50 mg total) by mouth every 6 (six) hours as needed. 30 tablet 0  ? ?No current facility-administered medications for this visit.  ? ?Facility-Administered Medications Ordered in Other Visits  ?Medication Dose Route Frequency Provider Last Rate Last Admin  ? [MAR Hold] sodium chloride flush (NS) 0.9 % injection 10 mL  10 mL Intracatheter PRN Brunetta Genera, MD      ? ? ?REVIEW OF SYSTEMS:   ?10 Point review of Systems was done is negative except as noted above. ? ?PHYSICAL EXAMINATION: ?ECOG PERFORMANCE STATUS: 2 - Symptomatic, <50% confined to bed  ?.BP (!) 142/93   Pulse 64   Temp 97.7 ?F (36.5 ?C)   Resp 20   Wt 202 lb 4.8 oz (91.8 kg)   SpO2 98%   BMI 29.03 kg/m?  ? ?NAD ?GENERAL:alert, in no acute distress and comfortable ?SKIN: no acute rashes, no significant lesions ?EYES: conjunctiva are pink and non-injected, sclera anicteric ?NECK: supple, no JVD ?LYMPH:  no palpable lymphadenopathy in the cervical, axillary or inguinal regions ?LUNGS: clear to auscultation b/l with normal respiratory effort ?HEART: regular rate & rhythm ?ABDOMEN:  normoactive bowel sounds , non tender, not distended. ?Extremity: no pedal edema ?PSYCH: alert & oriented x 3 with fluent speech ?NEURO: no focal motor/sensory deficits ? ?LABORATORY DATA:  ?I  have reviewed the data as listed ?. ? ?  Latest Ref Rng & Units 08/07/2021  ?  9:55 AM 07/17/2021  ?  9:48 AM 07/03/2021  ? 10:31 AM  ?CBC  ?WBC 4.0 - 10.5 K/uL 3.4   2.9   4.0    ?Hemoglobin 13.0 - 17.0 g/dL 11.4   11.

## 2021-08-10 LAB — MULTIPLE MYELOMA PANEL, SERUM
Albumin SerPl Elph-Mcnc: 3.2 g/dL (ref 2.9–4.4)
Albumin/Glob SerPl: 1.3 (ref 0.7–1.7)
Alpha 1: 0.2 g/dL (ref 0.0–0.4)
Alpha2 Glob SerPl Elph-Mcnc: 0.9 g/dL (ref 0.4–1.0)
B-Globulin SerPl Elph-Mcnc: 0.9 g/dL (ref 0.7–1.3)
Gamma Glob SerPl Elph-Mcnc: 0.5 g/dL (ref 0.4–1.8)
Globulin, Total: 2.6 g/dL (ref 2.2–3.9)
IgA: 221 mg/dL (ref 61–437)
IgG (Immunoglobin G), Serum: 599 mg/dL — ABNORMAL LOW (ref 603–1613)
IgM (Immunoglobulin M), Srm: 46 mg/dL (ref 15–143)
Total Protein ELP: 5.8 g/dL — ABNORMAL LOW (ref 6.0–8.5)

## 2021-08-12 ENCOUNTER — Encounter: Payer: Self-pay | Admitting: Hematology

## 2021-08-13 ENCOUNTER — Other Ambulatory Visit: Payer: Self-pay

## 2021-08-13 DIAGNOSIS — C9002 Multiple myeloma in relapse: Secondary | ICD-10-CM

## 2021-08-14 ENCOUNTER — Encounter: Payer: Self-pay | Admitting: Hematology

## 2021-08-14 ENCOUNTER — Other Ambulatory Visit: Payer: Self-pay

## 2021-08-14 ENCOUNTER — Other Ambulatory Visit: Payer: Self-pay | Admitting: Hematology

## 2021-08-14 ENCOUNTER — Inpatient Hospital Stay: Payer: Medicare Other

## 2021-08-14 ENCOUNTER — Inpatient Hospital Stay (HOSPITAL_BASED_OUTPATIENT_CLINIC_OR_DEPARTMENT_OTHER): Payer: Medicare Other | Admitting: Hematology

## 2021-08-14 ENCOUNTER — Inpatient Hospital Stay: Payer: Medicare Other | Attending: Hematology

## 2021-08-14 DIAGNOSIS — C9000 Multiple myeloma not having achieved remission: Secondary | ICD-10-CM

## 2021-08-14 DIAGNOSIS — D696 Thrombocytopenia, unspecified: Secondary | ICD-10-CM | POA: Diagnosis not present

## 2021-08-14 DIAGNOSIS — C9002 Multiple myeloma in relapse: Secondary | ICD-10-CM

## 2021-08-14 DIAGNOSIS — I110 Hypertensive heart disease with heart failure: Secondary | ICD-10-CM | POA: Insufficient documentation

## 2021-08-14 DIAGNOSIS — C7951 Secondary malignant neoplasm of bone: Secondary | ICD-10-CM | POA: Diagnosis not present

## 2021-08-14 DIAGNOSIS — D72819 Decreased white blood cell count, unspecified: Secondary | ICD-10-CM | POA: Insufficient documentation

## 2021-08-14 DIAGNOSIS — Z87891 Personal history of nicotine dependence: Secondary | ICD-10-CM | POA: Insufficient documentation

## 2021-08-14 DIAGNOSIS — I5032 Chronic diastolic (congestive) heart failure: Secondary | ICD-10-CM | POA: Diagnosis not present

## 2021-08-14 DIAGNOSIS — Z5112 Encounter for antineoplastic immunotherapy: Secondary | ICD-10-CM

## 2021-08-14 DIAGNOSIS — Z803 Family history of malignant neoplasm of breast: Secondary | ICD-10-CM | POA: Insufficient documentation

## 2021-08-14 DIAGNOSIS — Z8041 Family history of malignant neoplasm of ovary: Secondary | ICD-10-CM | POA: Insufficient documentation

## 2021-08-14 DIAGNOSIS — R918 Other nonspecific abnormal finding of lung field: Secondary | ICD-10-CM | POA: Diagnosis not present

## 2021-08-14 DIAGNOSIS — K8020 Calculus of gallbladder without cholecystitis without obstruction: Secondary | ICD-10-CM | POA: Diagnosis not present

## 2021-08-14 LAB — CBC WITH DIFFERENTIAL (CANCER CENTER ONLY)
Abs Immature Granulocytes: 0.01 10*3/uL (ref 0.00–0.07)
Basophils Absolute: 0.1 10*3/uL (ref 0.0–0.1)
Basophils Relative: 2 %
Eosinophils Absolute: 0.4 10*3/uL (ref 0.0–0.5)
Eosinophils Relative: 15 %
HCT: 35.6 % — ABNORMAL LOW (ref 39.0–52.0)
Hemoglobin: 11.6 g/dL — ABNORMAL LOW (ref 13.0–17.0)
Immature Granulocytes: 0 %
Lymphocytes Relative: 36 %
Lymphs Abs: 1 10*3/uL (ref 0.7–4.0)
MCH: 28.3 pg (ref 26.0–34.0)
MCHC: 32.6 g/dL (ref 30.0–36.0)
MCV: 86.8 fL (ref 80.0–100.0)
Monocytes Absolute: 0.4 10*3/uL (ref 0.1–1.0)
Monocytes Relative: 15 %
Neutro Abs: 0.9 10*3/uL — ABNORMAL LOW (ref 1.7–7.7)
Neutrophils Relative %: 32 %
Platelet Count: 118 10*3/uL — ABNORMAL LOW (ref 150–400)
RBC: 4.1 MIL/uL — ABNORMAL LOW (ref 4.22–5.81)
RDW: 15 % (ref 11.5–15.5)
WBC Count: 2.8 10*3/uL — ABNORMAL LOW (ref 4.0–10.5)
nRBC: 0 % (ref 0.0–0.2)

## 2021-08-14 LAB — CMP (CANCER CENTER ONLY)
ALT: 8 U/L (ref 0–44)
AST: 12 U/L — ABNORMAL LOW (ref 15–41)
Albumin: 3.6 g/dL (ref 3.5–5.0)
Alkaline Phosphatase: 104 U/L (ref 38–126)
Anion gap: 7 (ref 5–15)
BUN: 15 mg/dL (ref 8–23)
CO2: 28 mmol/L (ref 22–32)
Calcium: 9.2 mg/dL (ref 8.9–10.3)
Chloride: 104 mmol/L (ref 98–111)
Creatinine: 1.01 mg/dL (ref 0.61–1.24)
GFR, Estimated: >60 mL/min (ref >60)
Glucose, Bld: 96 mg/dL (ref 70–99)
Potassium: 3.7 mmol/L (ref 3.5–5.1)
Sodium: 139 mmol/L (ref 135–145)
Total Bilirubin: 0.7 mg/dL (ref 0.3–1.2)
Total Protein: 6.6 g/dL (ref 6.5–8.1)

## 2021-08-14 NOTE — Progress Notes (Signed)
? ? ?HEMATOLOGY/ONCOLOGY CLINIC NOTE ? ?Date of Service: 08/14/2021 ? ? ?Patient Care Team: ?[name], MD as PCP - General (Family Medicine) ?[name], MD (Cardiology) ?[name], MD (Cardiology) ?[name], DO as Consulting Physician (Neurology) ? ?CHIEF COMPLAINTS/PURPOSE OF CONSULTATION:  ?Continue evaluation and management of multiple myeloma. ? ?HISTORY OF PRESENTING ILLNESS:  ?Please see previous note for details on initial presentation. ? ?INTERVAL HISTORY:  ?Jonathon [name]  is here for continued evaluation and management of his multiple myeloma. He presents today accompanied by his daughter who was connected with via phone. He reports he is doing well with no new symptoms or concerns. ? ?He notes that he is not able to stand for extended periods of time due to some back pain. ? ?He reports some issues with bowel habits and he describes it as "not flowing as easily as before." He expresses that he continues to take Miralax ? ?Neutropenia has worsened slightly since previous visit with counts of 0.9K/uL from 1.5K/uL. ? ?We will hold his daratumumab dose and Revlimid and pulmonary infiltrates of unknown etiology.. ? ?He notes some inconsistent issues with a slight cough a feeling in chest. ? ?We discussed that if he experiences any SOB or chest pain to contact the clinic for f/u. ? ?No new back pain. ?No fever, chills, night sweats. ?No new lumps, bumps, or lesions/rashes. ?No abdominal pain. ?No new or unexpected weight loss. ?No SOB or chest pain. ?No other new or acute focal symptoms. ?No other focal bone pains. ? ?We discussed his PET CT done 08/06/2021 which revealed "1. Interval development of consolidative masslike opacities in both upper lobes demonstrating marked hypermetabolism. While these may be infectious/inflammatory, FDG accumulation is higher than typically ?seen in that setting. Metastatic disease not excluded. 2. Dominant left rib, right scapula, and left sacral lesions  all ?show marked interval decrease in size and hypermetabolism. ?3. Soft tissue nodule in the midline back adjacent to the L4 spinous ?process is similar in size and hypermetabolism today. ?4. Interval decrease in size and hypermetabolism in the ?gastrohepatic lymph node identified previously. ?5. Multiple scattered osseous lesions compatible with treated ?multiple myeloma. ?6. Cholelithiasis." ? ?CT scan will be ordered to be done in 4-6 weeks and will be reviewed in detail during next visit. ?We will hold Revlimid and Daratumumab until next visit in 3 weeks. ?We will refer him to pulmonology. ?Labs done today were reviewed in detail. ?Continues to be off Xgeva pending dental clearance. ? ?MEDICAL HISTORY:  ?Past Medical History:  ?Diagnosis Date  ? Asthma   ? BENIGN PROSTATIC HYPERTROPHY, HX OF 10/25/2008  ? Bone metastases ([facility]) 04/14/2018  ? BRADYCARDIA 2005  ? Chronic diastolic congestive heart failure ([facility]) 06/05/2016  ? CIDP (chronic inflammatory demyelinating polyneuropathy) (HCC)   ? HYPERTENSION 11/21/2006  ? Iron deficiency anemia, unspecified 04/19/2013  ? Multiple myeloma ([place]) 04/29/2018  ? NEPHROLITHIASIS, HX OF 11/21/2006 and 10/15/2008  ? PACEMAKER, PERMANENT 2005  ? Gen change 2014 Medtronic Adaptic L dual-chamber pacemaker, serial [id number]   ? SVT (supraventricular tachycardia) ([place])   ? TOBACCO ABUSE 10/25/2008  ? Quit 2012  ? ? ?SURGICAL HISTORY: ?Past Surgical History:  ?Procedure Laterality Date  ? COLONOSCOPY  [id number]  ? PACEMAKER INSERTION  05/14/2003  ? PERMANENT PACEMAKER GENERATOR CHANGE N/A 06/19/2012  ? Procedure: PERMANENT PACEMAKER GENERATOR CHANGE;  Surgeon: [name], MD; Medtronic Adaptic L dual-chamber pacemaker, serial #[id number]    ? SKIN GRAFT Right 05/13/1960  ? wrist  ?  TOOTH EXTRACTION  11/10/2020  ? 3 teeth removed  ? ? ?SOCIAL HISTORY: ?Social History  ? ?Socioeconomic History  ? Marital status: Divorced  ?  Spouse name: Not on file  ? Number of children: 4  ? Years of  education: Not on file  ? Highest education level: Not on file  ?Occupational History  ? Occupation: ENVIROMENTAL SERVICE  ?  Employer: [employer]  ? Occupation: retired  ?Tobacco Use  ? Smoking status: Former  ?  Packs/day: 0.25  ?  Years: 46.00  ?  Pack years: 11.50  ?  Types: Cigarettes  ?  Start date: 03/16/1965  ?  Quit date: 06/15/2010  ?  Years since quitting: 11.1  ? Smokeless tobacco: Never  ? Tobacco comments:  ?  quit 3 years ago  ?Vaping Use  ? Vaping Use: Never used  ?Substance and Sexual Activity  ? Alcohol use: Yes  ?  Comment: Occasional wine with dinner  ? Drug use: No  ? Sexual activity: Not on file  ?Other Topics Concern  ? Not on file  ?Social History Narrative  ? 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 ?Social Determinants of Health  ? ?Financial Resource Strain: Low Risk   ? Difficulty of Paying Living Expenses: Not hard at all  ?Food Insecurity: No Food Insecurity  ? Worried About Charity fundraiser in the Last Year: Never true  ? Ran Out of Food in the Last Year: Never true  ?Transportation Needs: No Transportation Needs  ? Lack of Transportation (Medical): No  ? Lack of Transportation (Non-Medical): No  ?Physical Activity: Not on file  ?Stress: Not on file  ?Social Connections: Unknown  ? Frequency of Communication with Friends and Family: More than three times a week  ? Frequency of Social Gatherings with Friends and Family: More than three times a week  ? Attends Religious Services: Not on file  ? Active Member of Clubs or Organizations: Not on file  ? Attends Archivist Meetings: Not on file  ? Marital Status: Not on file  ?Intimate Partner Violence: Not on file  ? ? ?FAMILY HISTORY: ?Family History  ?Problem Relation Age of Onset  ? Heart attack Father   ?     Died, 65  ? Kidney disease Father   ? Parkinson's disease Mother   ?     Died, 86  ? Breast cancer Sister   ?     Living, 70  ? Diabetes type II Brother   ?      Living, 67  ? Breast cancer Sister   ?     Living, 64  ? Diabetes Daughter   ?     Living, 51  ? Diabetes Son   ?     Living, 50  ? Ovarian cancer Daughter   ? Colon cancer Neg Hx   ? Rectal cancer Neg Hx   ? Stomach cancer Neg Hx   ? ? ?ALLERGIES:  is allergic to lexapro [escitalopram oxalate]. ? ?MEDICATIONS:  ?Current Outpatient Medications  ?Medication Sig Dispense Refill  ? acyclovir (ZOVIRAX) 400 MG tablet Take 1 tablet (400 mg total) by mouth 2 (two) times daily. 60 tablet 11  ? amoxicillin-clavulanate (AUGMENTIN) 875-125 MG tablet Take 1 tablet by mouth 2 (two) times daily. 20 tablet 0  ? aspirin EC 81 MG tablet Take 81 mg by mouth daily.    ?  diltiazem (TIAZAC) 240 MG 24 hr capsule Take 1 capsule by mouth daily.    ? flecainide (TAMBOCOR) 50 MG tablet Take 75 mg by mouth daily as needed.    ? furosemide (LASIX) 40 MG tablet Take 40 mg by mouth daily.    ? irbesartan (AVAPRO) 300 MG tablet Take 300 mg by mouth daily.    ? memantine (NAMENDA) 5 MG tablet Take 5 mg by mouth 2 (two) times daily.    ? metoprolol tartrate (LOPRESSOR) 50 MG tablet Take 1 tablet (50 mg total) by mouth 2 (two) times daily. 60 tablet 0  ? ondansetron (ZOFRAN) 8 MG tablet Take 1 tablet (8 mg total) by mouth 2 (two) times daily as needed (Nausea or vomiting). 30 tablet 1  ? polyethylene glycol (MIRALAX / GLYCOLAX) packet Take 17 g by mouth daily. 14 each 0  ? prochlorperazine (COMPAZINE) 10 MG tablet Take 1 tablet (10 mg total) by mouth every 6 (six) hours as needed (Nausea or vomiting). 30 tablet 1  ? REVLIMID 10 MG capsule TAKE 1 CAPSULE BY MOUTH ONCE DAILY FOR 21 DAYS ON AND 7 DAYS OFF 21 capsule 0  ? Saccharomyces boulardii (FLORASTOR PO) Take 250 mg by mouth 2 (two) times daily.    ? tamsulosin (FLOMAX) 0.4 MG CAPS capsule Take 2 capsules (0.8 mg total) by mouth daily. 90 capsule 0  ? traMADol (ULTRAM) 50 MG tablet Take 1 tablet (50 mg total) by mouth every 6 (six) hours as needed. 30 tablet 0  ? ?No current  facility-administered medications for this visit.  ? ?Facility-Administered Medications Ordered in Other Visits  ?Medication Dose Route Frequency Provider Last Rate Last Admin  ? [MAR Hold] sodium chloride flush (NS) 0

## 2021-08-14 NOTE — Progress Notes (Signed)
Pt ANC 0.9 K/uL, per Dr Irene Limbo hold treatment. Pt aware and agreeable. Dr Irene Limbo would like to see pt in clinic, pt ambulatory to lobby to wait to see Dr Irene Limbo. ?

## 2021-08-15 ENCOUNTER — Telehealth: Payer: Self-pay | Admitting: Hematology

## 2021-08-15 NOTE — Telephone Encounter (Signed)
Left message with follow-up appointment per 4/4 los. ?

## 2021-08-17 ENCOUNTER — Encounter: Payer: Self-pay | Admitting: Hematology

## 2021-08-21 ENCOUNTER — Encounter: Payer: Self-pay | Admitting: Hematology

## 2021-09-06 ENCOUNTER — Inpatient Hospital Stay: Payer: Medicare Other

## 2021-09-06 ENCOUNTER — Inpatient Hospital Stay (HOSPITAL_BASED_OUTPATIENT_CLINIC_OR_DEPARTMENT_OTHER): Payer: Medicare Other | Admitting: Hematology

## 2021-09-06 ENCOUNTER — Other Ambulatory Visit: Payer: Self-pay

## 2021-09-06 VITALS — BP 181/88 | HR 65 | Temp 97.5°F | Resp 20 | Wt 202.8 lb

## 2021-09-06 DIAGNOSIS — C7951 Secondary malignant neoplasm of bone: Secondary | ICD-10-CM | POA: Diagnosis not present

## 2021-09-06 DIAGNOSIS — C9002 Multiple myeloma in relapse: Secondary | ICD-10-CM

## 2021-09-06 DIAGNOSIS — K8020 Calculus of gallbladder without cholecystitis without obstruction: Secondary | ICD-10-CM | POA: Diagnosis not present

## 2021-09-06 DIAGNOSIS — D72819 Decreased white blood cell count, unspecified: Secondary | ICD-10-CM | POA: Diagnosis not present

## 2021-09-06 DIAGNOSIS — D696 Thrombocytopenia, unspecified: Secondary | ICD-10-CM | POA: Diagnosis not present

## 2021-09-06 DIAGNOSIS — Z5112 Encounter for antineoplastic immunotherapy: Secondary | ICD-10-CM | POA: Diagnosis not present

## 2021-09-06 DIAGNOSIS — I110 Hypertensive heart disease with heart failure: Secondary | ICD-10-CM | POA: Diagnosis not present

## 2021-09-06 LAB — CBC WITH DIFFERENTIAL/PLATELET
Abs Immature Granulocytes: 0.01 10*3/uL (ref 0.00–0.07)
Basophils Absolute: 0.1 10*3/uL (ref 0.0–0.1)
Basophils Relative: 1 %
Eosinophils Absolute: 0.4 10*3/uL (ref 0.0–0.5)
Eosinophils Relative: 8 %
HCT: 36.5 % — ABNORMAL LOW (ref 39.0–52.0)
Hemoglobin: 12.1 g/dL — ABNORMAL LOW (ref 13.0–17.0)
Immature Granulocytes: 0 %
Lymphocytes Relative: 39 %
Lymphs Abs: 1.6 10*3/uL (ref 0.7–4.0)
MCH: 29.2 pg (ref 26.0–34.0)
MCHC: 33.2 g/dL (ref 30.0–36.0)
MCV: 88 fL (ref 80.0–100.0)
Monocytes Absolute: 0.4 10*3/uL (ref 0.1–1.0)
Monocytes Relative: 10 %
Neutro Abs: 1.7 10*3/uL (ref 1.7–7.7)
Neutrophils Relative %: 42 %
Platelets: 131 10*3/uL — ABNORMAL LOW (ref 150–400)
RBC: 4.15 MIL/uL — ABNORMAL LOW (ref 4.22–5.81)
RDW: 16 % — ABNORMAL HIGH (ref 11.5–15.5)
WBC: 4.2 10*3/uL (ref 4.0–10.5)
nRBC: 0 % (ref 0.0–0.2)

## 2021-09-06 LAB — CMP (CANCER CENTER ONLY)
ALT: 9 U/L (ref 0–44)
AST: 15 U/L (ref 15–41)
Albumin: 4 g/dL (ref 3.5–5.0)
Alkaline Phosphatase: 134 U/L — ABNORMAL HIGH (ref 38–126)
Anion gap: 7 (ref 5–15)
BUN: 19 mg/dL (ref 8–23)
CO2: 29 mmol/L (ref 22–32)
Calcium: 9.5 mg/dL (ref 8.9–10.3)
Chloride: 105 mmol/L (ref 98–111)
Creatinine: 1.06 mg/dL (ref 0.61–1.24)
GFR, Estimated: >60 mL/min (ref >60)
Glucose, Bld: 100 mg/dL — ABNORMAL HIGH (ref 70–99)
Potassium: 3.9 mmol/L (ref 3.5–5.1)
Sodium: 141 mmol/L (ref 135–145)
Total Bilirubin: 0.6 mg/dL (ref 0.3–1.2)
Total Protein: 6.7 g/dL (ref 6.5–8.1)

## 2021-09-06 NOTE — Progress Notes (Signed)
? ? ?HEMATOLOGY/ONCOLOGY CLINIC NOTE ? ?Date of Service: 09/06/2021 ? ? ?Patient Care Team: ?[name], MD as PCP - General (Family Medicine) ?[name], MD (Cardiology) ?[name], MD (Cardiology) ?[name], DO as Consulting Physician (Neurology) ? ?CHIEF COMPLAINTS/PURPOSE OF CONSULTATION:  ?Continue evaluation and management of multiple myeloma. ? ?HISTORY OF PRESENTING ILLNESS:  ?Please see previous note for details on initial presentation. ? ?INTERVAL HISTORY:  ? ?Jonathon [name]  is a 86 y.o. male here for continued evaluation and management of his multiple myeloma. He presents today accompanied by his daughter He reports he is doing well with no new symptoms or concerns. ? ?He reports that he is eating and drinking well. He notes some minor weight gain. ? ?He notes that he is still not able to stand for extended periods of time due to some back pain. ? ?He reports some continued issues with bowel habits such as constipation. He is advised to continue to use Miralax as needed. ? ?Neutropenia has improved from 0.9K/uL to 1.7K/uL. ? ?We will continue to hold his daratumumab dose until further evaluation after CT scan and pulmonology visit. ? ?We will start the Revlimid again. ? ?He notes some inconsistent issues with a slight cough a feeling in chest. He notes a previous pain in chest when coughing that he notes has resolved. He further notes that there is no phlegm with his cough. ? ?We discussed that if he experiences any SOB or chest pain to contact the clinic for f/u. ? ?We will hold Daratumumab until next visit in 3 weeks. ? ?Continues to be off Xgeva pending dental clearance. ? ?No new back pain. ?No fever, chills, night sweats. ?No new lumps, bumps, or lesions/rashes. ?No abdominal pain. ?No new or unexpected weight loss. ?No SOB or chest pain. ?No other new or acute focal symptoms. ?No other focal bone pains. ? ?We discussed that we will evaluate his next CT scan w/o contrast during  next in clinic visit. ? ?Labs done today 09/06/2021 were reviewed in detail. ?CBC stable. Hgb 12.1. HCT 36.5%. Thrombocytopenia improving with platelets at 131K/uL. ?CMP WNL except elevated ALP of 134 U/L. ?MM panel pending. ? ?MEDICAL HISTORY:  ?Past Medical History:  ?Diagnosis Date  ? Asthma   ? BENIGN PROSTATIC HYPERTROPHY, HX OF 10/25/2008  ? Bone metastases ([facility]) 04/14/2018  ? BRADYCARDIA 2005  ? Chronic diastolic congestive heart failure ([facility]) 06/05/2016  ? CIDP (chronic inflammatory demyelinating polyneuropathy) (HCC)   ? HYPERTENSION 11/21/2006  ? Iron deficiency anemia, unspecified 04/19/2013  ? Multiple myeloma ([facility]) 04/29/2018  ? NEPHROLITHIASIS, HX OF 11/21/2006 and 10/15/2008  ? PACEMAKER, PERMANENT 2005  ? Gen change 2014 Medtronic Adaptic L dual-chamber pacemaker, serial [id number]   ? SVT (supraventricular tachycardia) ([facility])   ? TOBACCO ABUSE 10/25/2008  ? Quit 2012  ? ? ?SURGICAL HISTORY: ?Past Surgical History:  ?Procedure Laterality Date  ? COLONOSCOPY  [id number]  ? PACEMAKER INSERTION  05/14/2003  ? PERMANENT PACEMAKER GENERATOR CHANGE N/A 06/19/2012  ? Procedure: PERMANENT PACEMAKER GENERATOR CHANGE;  Surgeon: [name], MD; Medtronic Adaptic L dual-chamber pacemaker, serial #[id number]    ? SKIN GRAFT Right 05/13/1960  ? wrist  ? TOOTH EXTRACTION  11/10/2020  ? 3 teeth removed  ? ? ?SOCIAL HISTORY: ?Social History  ? ?Socioeconomic History  ? Marital status: Divorced  ?  Spouse name: Not on file  ? Number of children: 4  ? Years of education: Not on file  ? Highest education  level: Not on file  ?Occupational History  ? Occupation: ENVIROMENTAL SERVICE  ?  Employer: [employer]  ? Occupation: retired  ?Tobacco Use  ? Smoking status: Former  ?  Packs/day: 0.25  ?  Years: 46.00  ?  Pack years: 11.50  ?  Types: Cigarettes  ?  Start date: 03/16/1965  ?  Quit date: 06/15/2010  ?  Years since quitting: 11.2  ? Smokeless tobacco: Never  ? Tobacco comments:  ?  quit 3 years ago  ?Vaping Use  ?  Vaping Use: Never used  ?Substance and Sexual Activity  ? Alcohol use: Yes  ?  Comment: Occasional wine with dinner  ? Drug use: No  ? Sexual activity: Not on file  ?Other Topics Concern  ? Not on file  ?Social History Narrative  ? 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 ?Social Determinants of Health  ? ?Financial Resource Strain: Low Risk   ? Difficulty of Paying Living Expenses: Not hard at all  ?Food Insecurity: No Food Insecurity  ? Worried About Charity fundraiser in the Last Year: Never true  ? Ran Out of Food in the Last Year: Never true  ?Transportation Needs: No Transportation Needs  ? Lack of Transportation (Medical): No  ? Lack of Transportation (Non-Medical): No  ?Physical Activity: Not on file  ?Stress: Not on file  ?Social Connections: Unknown  ? Frequency of Communication with Friends and Family: More than three times a week  ? Frequency of Social Gatherings with Friends and Family: More than three times a week  ? Attends Religious Services: Not on file  ? Active Member of Clubs or Organizations: Not on file  ? Attends Archivist Meetings: Not on file  ? Marital Status: Not on file  ?Intimate Partner Violence: Not on file  ? ? ?FAMILY HISTORY: ?Family History  ?Problem Relation Age of Onset  ? Heart attack Father   ?     Died, 65  ? Kidney disease Father   ? Parkinson's disease Mother   ?     Died, 86  ? Breast cancer Sister   ?     Living, 70  ? Diabetes type II Brother   ?     Living, 67  ? Breast cancer Sister   ?     Living, 64  ? Diabetes Daughter   ?     Living, 51  ? Diabetes Son   ?     Living, 50  ? Ovarian cancer Daughter   ? Colon cancer Neg Hx   ? Rectal cancer Neg Hx   ? Stomach cancer Neg Hx   ? ? ?ALLERGIES:  is allergic to lexapro [escitalopram oxalate]. ? ?MEDICATIONS:  ?Current Outpatient Medications  ?Medication Sig Dispense Refill  ? acyclovir (ZOVIRAX) 400 MG tablet Take 1 tablet (400 mg total) by mouth 2 (two) times  daily. 60 tablet 11  ? amoxicillin-clavulanate (AUGMENTIN) 875-125 MG tablet Take 1 tablet by mouth 2 (two) times daily. 20 tablet 0  ? aspirin EC 81 MG tablet Take 81 mg by mouth daily.    ? diltiazem (TIAZAC) 240 MG 24 hr capsule Take 1 capsule by mouth daily.    ? flecainide (TAMBOCOR) 50 MG tablet Take 75 mg by mouth daily as needed.    ? furosemide (LASIX) 40 MG tablet Take 40 mg by mouth daily.    ? irbesartan (AVAPRO)  300 MG tablet Take 300 mg by mouth daily.    ? memantine (NAMENDA) 5 MG tablet Take 5 mg by mouth 2 (two) times daily.    ? metoprolol tartrate (LOPRESSOR) 50 MG tablet Take 1 tablet (50 mg total) by mouth 2 (two) times daily. 60 tablet 0  ? ondansetron (ZOFRAN) 8 MG tablet Take 1 tablet (8 mg total) by mouth 2 (two) times daily as needed (Nausea or vomiting). 30 tablet 1  ? polyethylene glycol (MIRALAX / GLYCOLAX) packet Take 17 g by mouth daily. 14 each 0  ? prochlorperazine (COMPAZINE) 10 MG tablet Take 1 tablet (10 mg total) by mouth every 6 (six) hours as needed (Nausea or vomiting). 30 tablet 1  ? REVLIMID 10 MG capsule TAKE 1 CAPSULE BY MOUTH ONCE DAILY FOR 21 DAYS ON AND 7 DAYS OFF 21 capsule 0  ? Saccharomyces boulardii (FLORASTOR PO) Take 250 mg by mouth 2 (two) times daily.    ? tamsulosin (FLOMAX) 0.4 MG CAPS capsule Take 2 capsules (0.8 mg total) by mouth daily. 90 capsule 0  ? traMADol (ULTRAM) 50 MG tablet Take 1 tablet (50 mg total) by mouth every 6 (six) hours as needed. 30 tablet 0  ? ?No current facility-administered medications for this visit.  ? ?Facility-Administered Medications Ordered in Other Visits  ?Medication Dose Route Frequency Provider Last Rate Last Admin  ? [MAR Hold] sodium chloride flush (NS) 0.9 % injection 10 mL  10 mL Intracatheter PRN Brunetta Genera, MD      ? ? ?REVIEW OF SYSTEMS:   ?10 Point review of Systems was done is negative except as noted above. ? ?PHYSICAL EXAMINATION: ?ECOG PERFORMANCE STATUS: 2 - Symptomatic, <50% confined to bed  ?.BP (!)  181/88   Pulse 65   Temp (!) 97.5 ?F (36.4 ?C)   Resp 20   Wt 202 lb 12.8 oz (92 kg)   SpO2 100%   BMI 29.10 kg/m?  ?NAD ?GENERAL:alert, in no acute distress and comfortable ?SKIN: no acute rashes,

## 2021-09-08 ENCOUNTER — Encounter: Payer: Self-pay | Admitting: Hematology

## 2021-09-10 ENCOUNTER — Encounter: Payer: Self-pay | Admitting: Internal Medicine

## 2021-09-10 ENCOUNTER — Ambulatory Visit (HOSPITAL_COMMUNITY)
Admission: RE | Admit: 2021-09-10 | Discharge: 2021-09-10 | Disposition: A | Discharge status: Home / Self Care | Payer: Medicare Other | Source: Ambulatory Visit | Attending: Hematology | Admitting: Hematology

## 2021-09-10 ENCOUNTER — Ambulatory Visit (INDEPENDENT_AMBULATORY_CARE_PROVIDER_SITE_OTHER): Payer: Medicare Other | Admitting: Internal Medicine

## 2021-09-10 VITALS — BP 136/78 | HR 64 | Temp 98.1°F | Ht 70.0 in | Wt 205.4 lb

## 2021-09-10 DIAGNOSIS — I700 Atherosclerosis of aorta: Secondary | ICD-10-CM | POA: Diagnosis not present

## 2021-09-10 DIAGNOSIS — C9000 Multiple myeloma not having achieved remission: Secondary | ICD-10-CM | POA: Diagnosis not present

## 2021-09-10 DIAGNOSIS — C9002 Multiple myeloma in relapse: Secondary | ICD-10-CM | POA: Diagnosis not present

## 2021-09-10 DIAGNOSIS — R918 Other nonspecific abnormal finding of lung field: Secondary | ICD-10-CM

## 2021-09-10 LAB — MULTIPLE MYELOMA PANEL, SERUM
Albumin SerPl Elph-Mcnc: 3.5 g/dL (ref 2.9–4.4)
Albumin/Glob SerPl: 1.3 (ref 0.7–1.7)
Alpha 1: 0.2 g/dL (ref 0.0–0.4)
Alpha2 Glob SerPl Elph-Mcnc: 0.8 g/dL (ref 0.4–1.0)
B-Globulin SerPl Elph-Mcnc: 1 g/dL (ref 0.7–1.3)
Gamma Glob SerPl Elph-Mcnc: 0.6 g/dL (ref 0.4–1.8)
Globulin, Total: 2.7 g/dL (ref 2.2–3.9)
IgA: 244 mg/dL (ref 61–437)
IgG (Immunoglobin G), Serum: 694 mg/dL (ref 603–1613)
IgM (Immunoglobulin M), Srm: 49 mg/dL (ref 15–143)
Total Protein ELP: 6.2 g/dL (ref 6.0–8.5)

## 2021-09-10 NOTE — Patient Instructions (Addendum)
Please schedule follow up scheduled with myself in 3 months.  If my schedule is not open yet, we will contact you with a reminder closer to that time. Please call [phone] if you haven't heard from [place] a month before.  ? ?Get your CT scan done today.  ?I will contact you with the results and after I talk with Dr. Irene Limbo.  ?We may end up doing a CT guided biopsy or a bronchoscopy to figure out what is causing the mass in your lungs - cancer or infection.  ? ? ?

## 2021-09-10 NOTE — Progress Notes (Signed)
? ?      ?BOSTEN NEWSTROM    [id number]    1936/01/16 ? ?Primary Care Physician:Banks, Langley Adie, MD ? ?Referring Physician: Brunetta Genera, MD ?[place] ?[place],   [id number] ?Reason for Consultation: abnormal PET scan ?Date of Consultation: 09/10/2021 ? ?Chief complaint:   ?Chief Complaint  ?Patient presents with  ? Consult  ?  Scan review   ?  ? ?HPI: ?TAIYO KOZMA is a 86 y.o. man with multiple myeloma with relapse being treated with daratumumab and revlimid. He had a restaging pet scan done in march which shows upper lobe mass like opacities.  ? ?Denies any respiratory symptoms.  ?About two months ago he had some bronchitis symptoms and was treated with antibiotics for about ten days and that resolved.  ? ?He has asthma but is not on maintenance therapy.  ?He doesn't have recurrent flares or exacerbations. Denies pneumonia.  ? ?Denies constitutional symptoms. ?Most recent labs reviewed - CBC and CMP look ok but he does have persistent bony lytic lesions from MM.  ? ? ?Social history: ? 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  ?Smoking history: former smoker less than 20 pack years ? ?Social History  ? ?Occupational History  ? Occupation: ENVIROMENTAL SERVICE  ?  Employer: [employer]  ? Occupation: retired  ?Tobacco Use  ? Smoking status: Former  ?  Packs/day: 0.25  ?  Years: 46.00  ?  Pack years: 11.50  ?  Types: Cigarettes  ?  Start date: 03/16/1965  ?  Quit date: 06/15/2010  ?  Years since quitting: 11.2  ? Smokeless tobacco: Never  ? Tobacco comments:  ?  quit 3 years ago  ?Vaping Use  ? Vaping Use: Never used  ?Substance and Sexual Activity  ? Alcohol use: Yes  ?  Comment: Occasional wine with dinner  ? Drug use: No  ? Sexual activity: Not on file  ? ? ?Relevant family  history: ? ?Family History  ?Problem Relation Age of Onset  ? Heart attack Father   ?     Died, 65  ? Kidney disease Father   ? Parkinson's disease Mother   ?     Died, 86  ? Breast cancer Sister   ?     Living, 70  ? Diabetes type II Brother   ?     Living, 67  ? Breast cancer Sister   ?     Living, 64  ? Diabetes Daughter   ?     Living, 51  ? Diabetes Son   ?     Living, 50  ? Ovarian cancer Daughter   ? Colon cancer Neg Hx   ? Rectal cancer Neg Hx   ? Stomach cancer Neg Hx   ? ? ?Past Medical History:  ?Diagnosis Date  ? Asthma   ? BENIGN PROSTATIC HYPERTROPHY, HX OF 10/25/2008  ? Bone metastases 04/14/2018  ? BRADYCARDIA 2005  ? Chronic diastolic congestive heart failure ([facility]) 06/05/2016  ? CIDP (chronic inflammatory demyelinating polyneuropathy) (HCC)   ? HYPERTENSION 11/21/2006  ? Iron deficiency anemia, unspecified 04/19/2013  ? Multiple myeloma ([facility]) 04/29/2018  ? NEPHROLITHIASIS, HX OF 11/21/2006 and 10/15/2008  ? PACEMAKER, PERMANENT 2005  ? Gen change 2014 Medtronic Adaptic L dual-chamber pacemaker,  serial #[id number]   ? SVT (supraventricular tachycardia) ([facility])   ? TOBACCO ABUSE 10/25/2008  ? Quit 2012  ? ? ?Past Surgical History:  ?Procedure Laterality Date  ? COLONOSCOPY  [id number]  ? PACEMAKER INSERTION  05/14/2003  ? PERMANENT PACEMAKER GENERATOR CHANGE N/A 06/19/2012  ? Procedure: PERMANENT PACEMAKER GENERATOR CHANGE;  Surgeon: [name], MD; Medtronic Adaptic L dual-chamber pacemaker, serial #[id number]    ? SKIN GRAFT Right 05/13/1960  ? wrist  ? TOOTH EXTRACTION  11/10/2020  ? 3 teeth removed  ? ? ? ?Physical Exam: ?Blood pressure 136/78, pulse 64, temperature 98.1 ?F (36.7 ?C), temperature source Oral, height '5\' 10"'  (1.778 m), weight 205 lb 6.4 oz (93.2 kg), SpO2 99 %. ?Gen:      No acute distress ?ENT:  no nasal polyps, mucus membranes moist ?Lungs:    No increased respiratory effort, symmetric chest wall excursion, clear to auscultation bilaterally, no wheezes or crackles ?CV:         Regular  rate and rhythm; no murmurs, rubs, or gallops.  No pedal edema ?Abd:      + bowel sounds; soft, non-tender; no distension ?MSK: no acute synovitis of DIP or PIP joints, no mechanics hands.  ?Skin:      Warm and dry; no rashes ?Neuro: normal speech, no focal facial asymmetry ?Psych: alert and oriented x3, normal mood and affect ? ? ?Data Reviewed/Medical Decision Making: ? ?Independent interpretation of tests: ?Imaging: ? Review of patient's PET CT  images March 2023 revealed RUL nodule opacity with LUL linear consolidation with some patchy ground glass which appears pet avid. . The patient's images have been independently reviewed by me.   ? ?PFTs: ? ?   ? View : No data to display.  ?  ?  ?  ? ? ?Labs:  ?Lab Results  ?Component Value Date  ? WBC 4.2 09/06/2021  ? HGB 12.1 (L) 09/06/2021  ? HCT 36.5 (L) 09/06/2021  ? MCV 88.0 09/06/2021  ? PLT 131 (L) 09/06/2021  ? ?Lab Results  ?Component Value Date  ? NA 141 09/06/2021  ? K 3.9 09/06/2021  ? CL 105 09/06/2021  ? CO2 29 09/06/2021  ? ? ? ?Immunization status:  ?Immunization History  ?Administered Date(s) Administered  ? Fluad Quad(high Dose 65+) 02/08/2019  ? Influenza Whole 02/10/2010  ? Influenza, High Dose Seasonal PF 01/30/2015, 04/30/2016, 01/28/2017  ? Influenza,inj,Quad PF,6+ Mos 01/19/2013  ? Influenza-Unspecified 02/03/2014  ? PPD Test 01/28/2017  ? Pneumococcal Conjugate-13 08/08/2014, 03/08/2019  ? Pneumococcal Polysaccharide-23 01/14/2013, 05/03/2019  ? Tetanus 01/14/2013  ? ? ? I reviewed prior external note(s) from Oncology, PCP ? I reviewed the result(s) of the labs and imaging as noted above.  ? I have ordered  ? ?Discussion of management or test interpretation with another colleague.  ? ?Assessment:  ?Multiple Myeloma, in relapse.  ? ? ?Plan/Recommendations: ? ?Differential diagnosis includes plasmacytoma related to MM, invasive fungal infection, less likely second primary malignancy or bacterial infection.  ?Repeat CT Chest this afternoon. May  require ct guided biopsy vs bronchoscopy for further evaluation.  ?Patient is agreeable to procedure if necessary.  ? ?Patient requested I call his daughter [name] with next steps.  ? ?We discussed disease management and progression at length today.  ? ? ?Return to Care: ?Return in about 3 months (around 12/11/2021). ? ?Lenice Llamas, MD ?Pulmonary and Critical Care Medicine ?[place] ?[phone] ? ?CC: Brunetta Genera, MD ? ? ? ?

## 2021-09-12 ENCOUNTER — Other Ambulatory Visit: Payer: Self-pay | Admitting: Hematology

## 2021-09-13 DIAGNOSIS — Z20822 Contact with and (suspected) exposure to covid-19: Secondary | ICD-10-CM | POA: Diagnosis not present

## 2021-09-14 ENCOUNTER — Telehealth: Payer: Self-pay | Admitting: Hematology

## 2021-09-14 NOTE — Telephone Encounter (Signed)
.  Called patient to schedule appointment per 5/1 inbasket, patient is aware of date and time.   ?

## 2021-09-18 ENCOUNTER — Other Ambulatory Visit: Payer: Self-pay

## 2021-09-18 DIAGNOSIS — C9002 Multiple myeloma in relapse: Secondary | ICD-10-CM

## 2021-09-18 DIAGNOSIS — C9000 Multiple myeloma not having achieved remission: Secondary | ICD-10-CM

## 2021-09-18 MED ORDER — REVLIMID 10 MG PO CAPS: ORAL_CAPSULE | ORAL | 0 refills | Status: DC

## 2021-09-20 ENCOUNTER — Inpatient Hospital Stay: Payer: Medicare Other

## 2021-09-20 ENCOUNTER — Inpatient Hospital Stay: Payer: Medicare Other | Attending: Hematology

## 2021-09-20 ENCOUNTER — Other Ambulatory Visit: Payer: Self-pay

## 2021-09-20 ENCOUNTER — Inpatient Hospital Stay (HOSPITAL_BASED_OUTPATIENT_CLINIC_OR_DEPARTMENT_OTHER): Payer: Medicare Other | Admitting: Hematology

## 2021-09-20 VITALS — BP 171/84 | HR 60 | Temp 98.4°F | Resp 18

## 2021-09-20 VITALS — BP 162/91 | HR 63 | Temp 97.7°F | Resp 20 | Wt 206.1 lb

## 2021-09-20 DIAGNOSIS — Z8041 Family history of malignant neoplasm of ovary: Secondary | ICD-10-CM | POA: Diagnosis not present

## 2021-09-20 DIAGNOSIS — Z7189 Other specified counseling: Secondary | ICD-10-CM

## 2021-09-20 DIAGNOSIS — C7951 Secondary malignant neoplasm of bone: Secondary | ICD-10-CM | POA: Diagnosis not present

## 2021-09-20 DIAGNOSIS — C9002 Multiple myeloma in relapse: Secondary | ICD-10-CM | POA: Diagnosis not present

## 2021-09-20 DIAGNOSIS — Z5112 Encounter for antineoplastic immunotherapy: Secondary | ICD-10-CM | POA: Insufficient documentation

## 2021-09-20 DIAGNOSIS — Z87891 Personal history of nicotine dependence: Secondary | ICD-10-CM | POA: Diagnosis not present

## 2021-09-20 DIAGNOSIS — C9001 Multiple myeloma in remission: Secondary | ICD-10-CM

## 2021-09-20 DIAGNOSIS — I110 Hypertensive heart disease with heart failure: Secondary | ICD-10-CM | POA: Diagnosis not present

## 2021-09-20 DIAGNOSIS — D72819 Decreased white blood cell count, unspecified: Secondary | ICD-10-CM | POA: Insufficient documentation

## 2021-09-20 DIAGNOSIS — Z803 Family history of malignant neoplasm of breast: Secondary | ICD-10-CM | POA: Diagnosis not present

## 2021-09-20 DIAGNOSIS — D649 Anemia, unspecified: Secondary | ICD-10-CM | POA: Diagnosis not present

## 2021-09-20 DIAGNOSIS — D696 Thrombocytopenia, unspecified: Secondary | ICD-10-CM | POA: Diagnosis not present

## 2021-09-20 DIAGNOSIS — I5032 Chronic diastolic (congestive) heart failure: Secondary | ICD-10-CM | POA: Diagnosis not present

## 2021-09-20 LAB — CBC WITH DIFFERENTIAL (CANCER CENTER ONLY)
Abs Immature Granulocytes: 0 10*3/uL (ref 0.00–0.07)
Basophils Absolute: 0 10*3/uL (ref 0.0–0.1)
Basophils Relative: 1 %
Eosinophils Absolute: 0.2 10*3/uL (ref 0.0–0.5)
Eosinophils Relative: 6 %
HCT: 35.2 % — ABNORMAL LOW (ref 39.0–52.0)
Hemoglobin: 12 g/dL — ABNORMAL LOW (ref 13.0–17.0)
Immature Granulocytes: 0 %
Lymphocytes Relative: 40 %
Lymphs Abs: 1.4 10*3/uL (ref 0.7–4.0)
MCH: 29.4 pg (ref 26.0–34.0)
MCHC: 34.1 g/dL (ref 30.0–36.0)
MCV: 86.3 fL (ref 80.0–100.0)
Monocytes Absolute: 0.4 10*3/uL (ref 0.1–1.0)
Monocytes Relative: 13 %
Neutro Abs: 1.4 10*3/uL — ABNORMAL LOW (ref 1.7–7.7)
Neutrophils Relative %: 40 %
Platelet Count: 107 10*3/uL — ABNORMAL LOW (ref 150–400)
RBC: 4.08 MIL/uL — ABNORMAL LOW (ref 4.22–5.81)
RDW: 16 % — ABNORMAL HIGH (ref 11.5–15.5)
WBC Count: 3.4 10*3/uL — ABNORMAL LOW (ref 4.0–10.5)
nRBC: 0 % (ref 0.0–0.2)

## 2021-09-20 LAB — CMP (CANCER CENTER ONLY)
ALT: 8 U/L (ref 0–44)
AST: 16 U/L (ref 15–41)
Albumin: 3.9 g/dL (ref 3.5–5.0)
Alkaline Phosphatase: 131 U/L — ABNORMAL HIGH (ref 38–126)
Anion gap: 5 (ref 5–15)
BUN: 16 mg/dL (ref 8–23)
CO2: 28 mmol/L (ref 22–32)
Calcium: 9.2 mg/dL (ref 8.9–10.3)
Chloride: 106 mmol/L (ref 98–111)
Creatinine: 1.09 mg/dL (ref 0.61–1.24)
GFR, Estimated: >60 mL/min (ref >60)
Glucose, Bld: 100 mg/dL — ABNORMAL HIGH (ref 70–99)
Potassium: 4 mmol/L (ref 3.5–5.1)
Sodium: 139 mmol/L (ref 135–145)
Total Bilirubin: 0.6 mg/dL (ref 0.3–1.2)
Total Protein: 6.7 g/dL (ref 6.5–8.1)

## 2021-09-20 MED ORDER — ACETAMINOPHEN 325 MG PO TABS
650.0000 mg | ORAL_TABLET | Freq: Once | ORAL | Status: AC
  Administered 2021-09-20: 650 mg via ORAL
  Filled 2021-09-20: qty 2

## 2021-09-20 MED ORDER — SODIUM CHLORIDE 0.9 % IV SOLN
20.0000 mg | Freq: Once | INTRAVENOUS | Status: AC
  Administered 2021-09-20: 20 mg via INTRAVENOUS
  Filled 2021-09-20: qty 20

## 2021-09-20 MED ORDER — MONTELUKAST SODIUM 10 MG PO TABS
10.0000 mg | ORAL_TABLET | Freq: Once | ORAL | Status: AC
  Administered 2021-09-20: 10 mg via ORAL
  Filled 2021-09-20: qty 1

## 2021-09-20 MED ORDER — DIPHENHYDRAMINE HCL 25 MG PO CAPS
50.0000 mg | ORAL_CAPSULE | Freq: Once | ORAL | Status: AC
  Administered 2021-09-20: 50 mg via ORAL
  Filled 2021-09-20: qty 2

## 2021-09-20 MED ORDER — SODIUM CHLORIDE 0.9 % IV SOLN
16.0000 mg/kg | Freq: Once | INTRAVENOUS | Status: AC
  Administered 2021-09-20: 1600 mg via INTRAVENOUS
  Filled 2021-09-20: qty 80

## 2021-09-20 MED ORDER — SODIUM CHLORIDE 0.9 % IV SOLN: Freq: Once | INTRAVENOUS | Status: AC

## 2021-09-20 MED ORDER — FAMOTIDINE IN NACL 20-0.9 MG/50ML-% IV SOLN
20.0000 mg | Freq: Once | INTRAVENOUS | Status: AC
  Administered 2021-09-20: 20 mg via INTRAVENOUS
  Filled 2021-09-20: qty 50

## 2021-09-20 NOTE — Patient Instructions (Signed)
[facility] MEDICAL ONCOLOGY  Discharge Instructions: °Thank you for choosing [facility] to provide your oncology and hematology care.  ° °If you have a lab appointment with the Cancer Center, please go directly to the Cancer Center and check in at the registration area. °  °Wear comfortable clothing and clothing appropriate for easy access to any Portacath or PICC line.  ° °We strive to give you quality time with your provider. You may need to reschedule your appointment if you arrive late (15 or more minutes).  Arriving late affects you and other patients whose appointments are after yours.  Also, if you miss three or more appointments without notifying the office, you may be dismissed from the clinic at the provider’s discretion.    °  °For prescription refill requests, have your pharmacy contact our office and allow 72 hours for refills to be completed.   ° °Today you received the following chemotherapy and/or immunotherapy agents: Daratumumab.     °  °To help prevent nausea and vomiting after your treatment, we encourage you to take your nausea medication as directed. ° °BELOW ARE SYMPTOMS THAT SHOULD BE REPORTED IMMEDIATELY: °*FEVER GREATER THAN 100.4 F (38 °C) OR HIGHER °*CHILLS OR SWEATING °*NAUSEA AND VOMITING THAT IS NOT CONTROLLED WITH YOUR NAUSEA MEDICATION °*UNUSUAL SHORTNESS OF BREATH °*UNUSUAL BRUISING OR BLEEDING °*URINARY PROBLEMS (pain or burning when urinating, or frequent urination) °*BOWEL PROBLEMS (unusual diarrhea, constipation, pain near the anus) °TENDERNESS IN MOUTH AND THROAT WITH OR WITHOUT PRESENCE OF ULCERS (sore throat, sores in mouth, or a toothache) °UNUSUAL RASH, SWELLING OR PAIN  °UNUSUAL VAGINAL DISCHARGE OR ITCHING  ° °Items with * indicate a potential emergency and should be followed up as soon as possible or go to the Emergency Department if any problems should occur. ° °Please show the CHEMOTHERAPY ALERT CARD or IMMUNOTHERAPY ALERT CARD at check-in  to the Emergency Department and triage nurse. ° °Should you have questions after your visit or need to cancel or reschedule your appointment, please contact [facility]  Dept: [phone]  and follow the prompts.  Office hours are 8:00 a.m. to 4:30 p.m. Monday - Friday. Please note that voicemails left after 4:00 p.m. may not be returned until the following business day.  We are closed weekends and major holidays. You have access to a nurse at all times for urgent questions. Please call the main number to the clinic Dept: [phone] and follow the prompts. ° ° °For any non-urgent questions, you may also contact your provider using MyChart. We now offer e-Visits for anyone 18 and older to request care online for non-urgent symptoms. For details visit [email or web address]. °  °Also download the MyChart app! Go to the app store, search "MyChart", open the app, select [facility], and log in with your MyChart username and password. ° °Due to Covid, a mask is required upon entering the hospital/clinic. If you do not have a mask, one will be given to you upon arrival. For doctor visits, patients may have 1 support person aged 18 or older with them. For treatment visits, patients cannot have anyone with them due to current Covid guidelines and our immunocompromised population.  ° °

## 2021-09-20 NOTE — Progress Notes (Signed)
Ok to proceed with treatment today with ANC of 1.4 per Dr.Kale ?

## 2021-09-25 ENCOUNTER — Telehealth: Payer: Self-pay | Admitting: Hematology

## 2021-09-25 NOTE — Telephone Encounter (Signed)
Scheduled follow-up appointments per 5/11 los. Patient's niece is aware. ?

## 2021-09-27 ENCOUNTER — Other Ambulatory Visit: Payer: Medicare Other

## 2021-09-27 ENCOUNTER — Ambulatory Visit: Payer: Medicare Other

## 2021-09-27 ENCOUNTER — Ambulatory Visit: Payer: Medicare Other | Admitting: Hematology

## 2021-09-27 ENCOUNTER — Encounter: Payer: Self-pay | Admitting: Hematology

## 2021-09-27 ENCOUNTER — Telehealth: Payer: Self-pay | Admitting: Family Medicine

## 2021-09-27 NOTE — Progress Notes (Signed)
HEMATOLOGY/ONCOLOGY CLINIC NOTE  Date of Service:.09/20/2021   Patient Care Team: [name], MD as PCP - General (Family Medicine) [name], MD (Cardiology) [name], MD (Cardiology) [name], DO as Consulting Physician (Neurology)  CHIEF COMPLAINTS/PURPOSE OF CONSULTATION:  Follow-up for continued valuation and management of multiple myeloma.  HISTORY OF PRESENTING ILLNESS:  Please see previous note for details on initial presentation.  INTERVAL HISTORY:   Jonathon [name]  is a 86 y.o. male who is here for continued valuation and management of his multiple myeloma. He notes no new respiratory symptoms.  No fevers no chills no night sweats.  No new focal bone pains. He was seen by pulmonary for his upper lung infiltrates. Patient had a CT chest for follow-up on 09/10/2021 which was discussed with him in detail and showed signs of radiation related pulmonary changes. Labs done today were reviewed in detail with the patient. He notes no other acute toxicities from his daratumumab or Revlimid at this time.   MEDICAL HISTORY:  Past Medical History:  Diagnosis Date   Asthma    BENIGN PROSTATIC HYPERTROPHY, HX OF 10/25/2008   Bone metastases 04/14/2018   BRADYCARDIA 2005   Chronic diastolic congestive heart failure ([place]) 06/05/2016   CIDP (chronic inflammatory demyelinating polyneuropathy) ([place])    HYPERTENSION 11/21/2006   Iron deficiency anemia, unspecified 04/19/2013   Multiple myeloma ([place]) 04/29/2018   NEPHROLITHIASIS, HX OF 11/21/2006 and 10/15/2008   PACEMAKER, PERMANENT 2005   Gen change 2014 Medtronic Adaptic L dual-chamber pacemaker, serial #[id number]    SVT (supraventricular tachycardia) ([place])    TOBACCO ABUSE 10/25/2008   Quit 2012    SURGICAL HISTORY: Past Surgical History:  Procedure Laterality Date   COLONOSCOPY  2004,2009   PACEMAKER INSERTION  05/14/2003   PERMANENT PACEMAKER GENERATOR CHANGE N/A 06/19/2012   Procedure: PERMANENT  PACEMAKER GENERATOR CHANGE;  Surgeon: [name], MD; Medtronic Adaptic L dual-chamber pacemaker, serial #[id number]     SKIN GRAFT Right 05/13/1960   wrist   TOOTH EXTRACTION  11/10/2020   3 teeth removed    SOCIAL HISTORY: Social History   Socioeconomic History   Marital status: Divorced    Spouse name: Not on file   Number of children: 4   Years of education: Not on file   Highest education level: Not on file  Occupational History   Occupation: ENVIROMENTAL SERVICE    Employer: [employer]   Occupation: retired  Tobacco Use   Smoking status: Former    Packs/day: 0.25    Years: 46.00    Pack years: 11.50    Types: Cigarettes    Start date: 03/16/1965    Quit date: 06/15/2010    Years since quitting: 11.2   Smokeless tobacco: Never   Tobacco comments:    quit 3 years ago  Vaping Use   Vaping Use: Never used  Substance and Sexual Activity   Alcohol use: Yes    Comment: Occasional wine with dinner   Drug use: No   Sexual activity: Not on file  Other Topics Concern   Not on file  Social History Narrative   Has relocated from [place] in 2007. 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  Patient in a facility thru [facility]   Social Determinants of Health   Financial Resource Strain: Low Risk    Difficulty of Paying Living Expenses: Not hard at all  Food Insecurity: No Food Insecurity   Worried About Charity fundraiser in the  Last Year: Never true   Ran Out of Food in the Last Year: Never true  Transportation Needs: No Transportation Needs   Lack of Transportation (Medical): No   Lack of Transportation (Non-Medical): No  Physical Activity: Not on file  Stress: Not on file  Social Connections: Unknown   Frequency of Communication with Friends and Family: More than three times a week   Frequency of Social Gatherings with Friends and Family: More than three times a week   Attends Religious Services: Not on Electrical engineer or Organizations: Not on file   Attends  Archivist Meetings: Not on file   Marital Status: Not on file  Intimate Partner Violence: Not on file    FAMILY HISTORY: Family History  Problem Relation Age of Onset   Heart attack Father        Died, 41   Kidney disease Father    Parkinson's disease Mother        Died, 82   Breast cancer Sister        Living, 21   Diabetes type II Brother        Living, 68   Breast cancer Sister        Living, 71   Diabetes Daughter        Living, 37   Diabetes Son        Living, 7   Ovarian cancer Daughter    Colon cancer Neg Hx    Rectal cancer Neg Hx    Stomach cancer Neg Hx     ALLERGIES:  is allergic to lexapro [escitalopram oxalate].  MEDICATIONS:  Current Outpatient Medications  Medication Sig Dispense Refill   acyclovir (ZOVIRAX) 400 MG tablet Take 1 tablet (400 mg total) by mouth 2 (two) times daily. 60 tablet 11   amoxicillin-clavulanate (AUGMENTIN) 875-125 MG tablet Take 1 tablet by mouth 2 (two) times daily. 20 tablet 0   aspirin EC 81 MG tablet Take 81 mg by mouth daily.     diltiazem (TIAZAC) 240 MG 24 hr capsule Take 1 capsule by mouth daily.     flecainide (TAMBOCOR) 50 MG tablet Take 75 mg by mouth daily as needed.     furosemide (LASIX) 40 MG tablet Take 40 mg by mouth daily.     irbesartan (AVAPRO) 300 MG tablet Take 300 mg by mouth daily.     memantine (NAMENDA) 5 MG tablet Take 5 mg by mouth 2 (two) times daily.     metoprolol tartrate (LOPRESSOR) 50 MG tablet Take 1 tablet (50 mg total) by mouth 2 (two) times daily. 60 tablet 0   ondansetron (ZOFRAN) 8 MG tablet Take 1 tablet (8 mg total) by mouth 2 (two) times daily as needed (Nausea or vomiting). 30 tablet 1   polyethylene glycol (MIRALAX / GLYCOLAX) packet Take 17 g by mouth daily. 14 each 0   prochlorperazine (COMPAZINE) 10 MG tablet Take 1 tablet (10 mg total) by mouth every 6 (six) hours as needed (Nausea or vomiting). 30 tablet 1   REVLIMID 10 MG capsule Take 1 capsule (10 mg) by mouth daily on  days 1 - 21 then take 7 days off. Repeat. 21 capsule 0   Saccharomyces boulardii (FLORASTOR PO) Take 250 mg by mouth 2 (two) times daily.     tamsulosin (FLOMAX) 0.4 MG CAPS capsule Take 2 capsules (0.8 mg total) by mouth daily. 90 capsule 0   traMADol (ULTRAM) 50 MG tablet Take 1 tablet (50  mg total) by mouth every 6 (six) hours as needed. 30 tablet 0   No current facility-administered medications for this visit.   Facility-Administered Medications Ordered in Other Visits  Medication Dose Route Frequency Provider Last Rate Last Admin   [MAR Hold] sodium chloride flush (NS) 0.9 % injection 10 mL  10 mL Intracatheter PRN Brunetta Genera, MD        REVIEW OF SYSTEMS:   10 Point review of Systems was done is negative except as noted above.  PHYSICAL EXAMINATION: ECOG PERFORMANCE STATUS: 2 - Symptomatic, <50% confined to bed  .BP (!) 162/91   Pulse 63   Temp 97.7 F (36.5 C)   Resp 20   Wt 206 lb 1.6 oz (93.5 kg)   SpO2 100%   BMI 29.57 kg/m  NAD GENERAL:alert, in no acute distress and comfortable SKIN: no acute rashes, no significant lesions EYES: conjunctiva are pink and non-injected, sclera anicteric OROPHARYNX: MMM, no exudates, no oropharyngeal erythema or ulceration NECK: supple, no JVD LYMPH:  no palpable lymphadenopathy in the cervical, axillary or inguinal regions LUNGS: clear to auscultation b/l with normal respiratory effort HEART: regular rate & rhythm ABDOMEN:  normoactive bowel sounds , non tender, not distended. Extremity: no pedal edema PSYCH: alert & oriented x 3 with fluent speech NEURO: no focal motor/sensory deficits   LABORATORY DATA:  I have reviewed the data as listed .    Latest Ref Rng & Units 09/20/2021    9:49 AM 09/06/2021   10:24 AM 08/14/2021    9:13 AM  CBC  WBC 4.0 - 10.5 K/uL 3.4   4.2   2.8    Hemoglobin 13.0 - 17.0 g/dL 12.0   12.1   11.6    Hematocrit 39.0 - 52.0 % 35.2   36.5   35.6    Platelets 150 - 400 K/uL 107   131   118          Latest Ref Rng & Units 09/20/2021    9:49 AM 09/06/2021   10:24 AM 08/14/2021    9:13 AM  CMP  Glucose 70 - 99 mg/dL 100   100   96    BUN 8 - 23 mg/dL '16   19   15    ' Creatinine 0.61 - 1.24 mg/dL 1.09   1.06   1.01    Sodium 135 - 145 mmol/L 139   141   139    Potassium 3.5 - 5.1 mmol/L 4.0   3.9   3.7    Chloride 98 - 111 mmol/L 106   105   104    CO2 22 - 32 mmol/L '28   29   28    ' Calcium 8.9 - 10.3 mg/dL 9.2   9.5   9.2    Total Protein 6.5 - 8.1 g/dL 6.7   6.7   6.6    Total Bilirubin 0.3 - 1.2 mg/dL 0.6   0.6   0.7    Alkaline Phos 38 - 126 U/L 131   134   104    AST 15 - 41 U/L '16   15   12    ' ALT 0 - 44 U/L '8   9   8        ' 04/17/18 BM Bx:    RADIOGRAPHIC STUDIES: I have personally reviewed the radiological images as listed and agreed with the findings in the report. CT Chest Wo Contrast  Result Date: 09/11/2021 CLINICAL DATA:  Evaluate lung  opacities, radiation pneumonitis versus atypical infection, history of multiple myeloma * Tracking Code: BO * EXAM: CT CHEST WITHOUT CONTRAST TECHNIQUE: Multidetector CT imaging of the chest was performed following the standard protocol without IV contrast. RADIATION DOSE REDUCTION: This exam was performed according to the departmental dose-optimization program which includes automated exposure control, adjustment of the mA and/or kV according to patient size and/or use of iterative reconstruction technique. COMPARISON:  PET-CT, 08/06/2021 FINDINGS: Cardiovascular: Aortic atherosclerosis. Aortic valve calcifications. Left chest multi lead pacer. Normal heart size. Left and right coronary artery calcifications. No pericardial effusion. Mediastinum/Nodes: No enlarged mediastinal, hilar, or axillary lymph nodes. Thyroid gland, trachea, and esophagus demonstrate no significant findings. Lungs/Pleura: Previously seen bilateral upper lobe masslike heterogeneous and ground-glass airspace opacities are increasingly consolidated an organized  appearing (series 7, image 41, 50). No pleural effusion or pneumothorax. Upper Abdomen: No acute abnormality.  Gallstones. Musculoskeletal: No chest wall abnormality. Diffusely mottled, lytic appearance of the included osseous structures. Multiple nonacute fracture deformities of the ribs. IMPRESSION: 1. Previously seen bilateral upper lobe masslike heterogeneous and ground-glass airspace opacities are increasingly consolidated and organized appearing. Appearance and behavior is generally characteristic of evolving radiation fibrosis, if there has been local radiation therapy. 2. Diffusely mottled, lytic appearance of the included osseous structures, consistent with multiple myeloma. 3. Coronary artery disease. 4. Aortic valve calcifications. Correlate for echocardiographic evidence of aortic valve dysfunction. 5. Cholelithiasis. Aortic Atherosclerosis (ICD10-I70.0). Electronically Signed   By: Delanna Ahmadi M.D.   On: 09/11/2021 10:27    ASSESSMENT & PLAN:   86 y.o. male with  1.  Relapsed multiple myeloma multiple bone metastases   labs upon initial presentation from 04/14/18; hypercalcemic with Calcium at 13.8, renal function up form baseline with Creatinine at 1.48, HGB at 11.3. PSA was normal.   03/31/18 CT C/A/P revealed 1. Lytic lesion throughout all visualized vertebra from the lower cervical spine through the sacrum. Epidural extension of tumor T2, T3 and L2 level. Right L3-4 foraminal extension tumor. 2. Lytic lesions involving majority of ribs. Multiple pathologic rib fractures with bony expansion/sub pleural extension of tumor at several levels. 3. Lytic lesions of the scapula, hips bilaterally and pelvis bilaterally. With further progression of tumor, patient may be at risk for pathologic fractures. Pathologic fracture right clavicle incompletely assessed. 4. Circumferential narrowing sigmoid colon. Question possibility of underlying mass (series 2, image 97). Alternatively this could represent  muscular hypertrophy/peristalsis. 5. Gastric antrum circumferential narrowing. Cannot exclude mass although this may be related to peristalsis. 6. No primary lung malignancy noted. 7. Matted aortic pulmonary window lymph nodes. 8. Circumferential urinary bladder wall thickening greater anterior dome region. Right lateral bladder diverticulum. 9. 5 mm low-density lesion pancreatic body (series 5, image 10) unchanged. This is felt unlikely to be a primary malignancy. 10. Right parapelvic 2.2 cm cyst without significant change. Scattered low-density renal lesions bilaterally too small to adequately characterize. Some were present previously. Statistically these are likely cysts. 11. Stable adrenal gland hyperplasia. 12. Gynecomastia. 13. Prostate gland causes slight impression upon the bladder base. 14.  Aortic Atherosclerosis. 15. Sequential pacemaker in place. 16. Gallstones.   04/23/18 PET/CT revealed Innumerable hypermetabolic lytic lesions throughout the skeleton especially involving the spine, ribs, bony pelvis along with some involvement of both proximal femurs. The appearance is compatible with pathologic diagnosis of plasma cell neoplasm could well reflect multiple myeloma. Resulting pathologic fractures of the right lateral clavicle and of multiple bilateral ribs. Lesions are present along the cortical margins of the spinal canal.  2. Both the area and the stomach in the area in the sigmoid colon drawn attention to on prior CT scan appear benign normal today. 3. The confluence of AP window lymph nodes measures about 1.2 cm in short axis with maximum SUV 3.1, which is mildly above the blood pool merit surveillance. 4. Other imaging findings of potential clinical significance: Aortic Atherosclerosis. Coronary atherosclerosis. Pacemaker noted. Borderline prostatomegaly. Cholelithiasis.  04/21/18 BM Bx revealed Normocellular bone marrow with Plasma Cell neoplasm. Scattered medium sized clusters of  kappa-restricted plasma cells (14% aspirate, 10% CD138 immunohistochemistry.   I do suspect that the burden of disease is underestimated in the bone marrow sample given PET/CT findings of innumerable lytic lesions and an M spike of 3.4g   06/08/18 DG Hips bilat which revealed Innumerable lucencies are noted throughout the pelvis and proximal femurs, similar findings noted on prior PET-CT of 04/23/2018. Findings are consistent patient's known multiple myeloma. Femoral necks are intact. 2.  Aortoiliac atherosclerotic vascular disease.  08/06/2021 PET/CT revealed "1. Interval development of consolidative masslike opacities in both upper lobes demonstrating marked hypermetabolism. While these may be infectious/inflammatory, FDG accumulation is higher than typically seen in that setting. Metastatic disease not excluded. 2. Dominant left rib, right scapula, and left sacral lesions all show marked interval decrease in size and hypermetabolism. 3. Soft tissue nodule in the midline back adjacent to the L4 spinous process is similar in size and hypermetabolism today. 4. Interval decrease in size and hypermetabolism in the gastrohepatic lymph node identified previously. 5. Multiple scattered osseous lesions compatible with treated multiple myeloma. 6. Cholelithiasis."   2.  Pulmonary infiltrates likely related to radiation related change. CT chest was discussed in detail with the patient PLAN:    -Was done today were discussed in detail with the patient CBC with mild anemia and thrombocytopenia and leukopenia likely from Revlimid. CMP stable Myeloma lab from 09/06/2021 showed no observable M spike and negative immunofixation electrophoresis. Patient will continue daratumumab Revlimid dexamethasone at this time for the next 1-2 cycles if he continues to remain in complete remission we will consider switching to only Revlimid maintenance. -Continue aspirin 81 mg p.o. daily -Continue acyclovir for  prophylaxis -Continue to hold Xgeva pending clearance by his surgeon . FOLLOW UP: Please schedule next 4 doses of daratumumab every 2 weeks with port flush and labs. MD visit in 6 weeks   The total time spent in the appointment was 30 minutes*.  All of the patient's questions were answered with apparent satisfaction. The patient knows to call the clinic with any problems, questions or concerns.   Sullivan Lone MD MS AAHIVMS [facility] Hematology/Oncology Physician [facility]  .*Total Encounter Time as defined by the Centers for Medicare and Medicaid Services includes, in addition to the face-to-face time of a patient visit (documented in the note above) non-face-to-face time: obtaining and reviewing outside history, ordering and reviewing medications, tests or procedures, care coordination (communications with other health care professionals or caregivers) and documentation in the medical record.

## 2021-09-27 NOTE — Telephone Encounter (Signed)
Calling for verification of medication for this patient requests call.

## 2021-09-30 ENCOUNTER — Encounter (HOSPITAL_BASED_OUTPATIENT_CLINIC_OR_DEPARTMENT_OTHER): Payer: Self-pay | Admitting: Emergency Medicine

## 2021-09-30 ENCOUNTER — Emergency Department (HOSPITAL_BASED_OUTPATIENT_CLINIC_OR_DEPARTMENT_OTHER): Payer: Medicare Other

## 2021-09-30 ENCOUNTER — Emergency Department (HOSPITAL_BASED_OUTPATIENT_CLINIC_OR_DEPARTMENT_OTHER)
Admission: EM | Admit: 2021-09-30 | Discharge: 2021-09-30 | Disposition: A | Discharge status: Skilled Nursing Facility | Payer: Medicare Other | Attending: Emergency Medicine | Admitting: Emergency Medicine

## 2021-09-30 DIAGNOSIS — R339 Retention of urine, unspecified: Secondary | ICD-10-CM | POA: Diagnosis not present

## 2021-09-30 DIAGNOSIS — J45909 Unspecified asthma, uncomplicated: Secondary | ICD-10-CM | POA: Insufficient documentation

## 2021-09-30 DIAGNOSIS — I5032 Chronic diastolic (congestive) heart failure: Secondary | ICD-10-CM | POA: Diagnosis not present

## 2021-09-30 DIAGNOSIS — Z85830 Personal history of malignant neoplasm of bone: Secondary | ICD-10-CM | POA: Diagnosis not present

## 2021-09-30 DIAGNOSIS — Z87891 Personal history of nicotine dependence: Secondary | ICD-10-CM | POA: Diagnosis not present

## 2021-09-30 DIAGNOSIS — N281 Cyst of kidney, acquired: Secondary | ICD-10-CM | POA: Diagnosis not present

## 2021-09-30 DIAGNOSIS — K5730 Diverticulosis of large intestine without perforation or abscess without bleeding: Secondary | ICD-10-CM | POA: Diagnosis not present

## 2021-09-30 DIAGNOSIS — R1084 Generalized abdominal pain: Secondary | ICD-10-CM | POA: Diagnosis not present

## 2021-09-30 DIAGNOSIS — Z8579 Personal history of other malignant neoplasms of lymphoid, hematopoietic and related tissues: Secondary | ICD-10-CM | POA: Insufficient documentation

## 2021-09-30 DIAGNOSIS — I10 Essential (primary) hypertension: Secondary | ICD-10-CM | POA: Diagnosis not present

## 2021-09-30 DIAGNOSIS — Z79899 Other long term (current) drug therapy: Secondary | ICD-10-CM | POA: Diagnosis not present

## 2021-09-30 DIAGNOSIS — K8020 Calculus of gallbladder without cholecystitis without obstruction: Secondary | ICD-10-CM | POA: Diagnosis not present

## 2021-09-30 DIAGNOSIS — K5900 Constipation, unspecified: Secondary | ICD-10-CM | POA: Diagnosis not present

## 2021-09-30 DIAGNOSIS — I110 Hypertensive heart disease with heart failure: Secondary | ICD-10-CM | POA: Insufficient documentation

## 2021-09-30 LAB — CBC WITH DIFFERENTIAL/PLATELET
Abs Immature Granulocytes: 0.03 10*3/uL (ref 0.00–0.07)
Basophils Absolute: 0 10*3/uL (ref 0.0–0.1)
Basophils Relative: 0 %
Eosinophils Absolute: 0.1 10*3/uL (ref 0.0–0.5)
Eosinophils Relative: 2 %
HCT: 35.3 % — ABNORMAL LOW (ref 39.0–52.0)
Hemoglobin: 11.9 g/dL — ABNORMAL LOW (ref 13.0–17.0)
Immature Granulocytes: 1 %
Lymphocytes Relative: 15 %
Lymphs Abs: 0.8 10*3/uL (ref 0.7–4.0)
MCH: 29.4 pg (ref 26.0–34.0)
MCHC: 33.7 g/dL (ref 30.0–36.0)
MCV: 87.2 fL (ref 80.0–100.0)
Monocytes Absolute: 0.3 10*3/uL (ref 0.1–1.0)
Monocytes Relative: 6 %
Neutro Abs: 4.2 10*3/uL (ref 1.7–7.7)
Neutrophils Relative %: 76 %
Platelets: 82 10*3/uL — ABNORMAL LOW (ref 150–400)
RBC: 4.05 MIL/uL — ABNORMAL LOW (ref 4.22–5.81)
RDW: 16.1 % — ABNORMAL HIGH (ref 11.5–15.5)
WBC: 5.5 10*3/uL (ref 4.0–10.5)
nRBC: 0 % (ref 0.0–0.2)

## 2021-09-30 LAB — LIPASE, BLOOD: Lipase: 24 U/L (ref 11–51)

## 2021-09-30 LAB — URINALYSIS, ROUTINE W REFLEX MICROSCOPIC
Bilirubin Urine: NEGATIVE
Glucose, UA: NEGATIVE mg/dL
Hgb urine dipstick: NEGATIVE
Ketones, ur: NEGATIVE mg/dL
Leukocytes,Ua: NEGATIVE
Nitrite: NEGATIVE
Protein, ur: NEGATIVE mg/dL
Specific Gravity, Urine: 1.015 (ref 1.005–1.030)
pH: 5.5 (ref 5.0–8.0)

## 2021-09-30 LAB — COMPREHENSIVE METABOLIC PANEL WITH GFR
ALT: 11 U/L (ref 0–44)
AST: 19 U/L (ref 15–41)
Albumin: 3.7 g/dL (ref 3.5–5.0)
Alkaline Phosphatase: 113 U/L (ref 38–126)
Anion gap: 6 (ref 5–15)
BUN: 18 mg/dL (ref 8–23)
CO2: 24 mmol/L (ref 22–32)
Calcium: 8.8 mg/dL — ABNORMAL LOW (ref 8.9–10.3)
Chloride: 105 mmol/L (ref 98–111)
Creatinine, Ser: 1.13 mg/dL (ref 0.61–1.24)
GFR, Estimated: >60 mL/min (ref >60)
Glucose, Bld: 108 mg/dL — ABNORMAL HIGH (ref 70–99)
Potassium: 3.9 mmol/L (ref 3.5–5.1)
Sodium: 135 mmol/L (ref 135–145)
Total Bilirubin: 1 mg/dL (ref 0.3–1.2)
Total Protein: 6.5 g/dL (ref 6.5–8.1)

## 2021-09-30 MED ORDER — SENNOSIDES-DOCUSATE SODIUM 8.6-50 MG PO TABS: 2.0000 | ORAL_TABLET | Freq: Two times a day (BID) | ORAL | 0 refills | Status: DC | PRN

## 2021-09-30 MED ORDER — IOHEXOL 300 MG/ML  SOLN
100.0000 mL | Freq: Once | INTRAMUSCULAR | Status: AC | PRN
  Administered 2021-09-30: 100 mL via INTRAVENOUS

## 2021-09-30 MED ORDER — MAGNESIUM CITRATE PO SOLN: 1.0000 | Freq: Once | ORAL | 0 refills | Status: AC

## 2021-09-30 NOTE — ED Triage Notes (Addendum)
Pt BIB EMS from [facility] c/o lower abdominal pain and constipation x 3 days. Took miralax with no relief. Also c/o urinary urgency with inability to void. Has not urinated since yesterday. Last BM 5/19

## 2021-09-30 NOTE — Discharge Instructions (Addendum)
You may take miralax up to 4 caps on 1 32oz bottle of gatorade, 3 caps the next day, 2 caps the next day for severe constipation.

## 2021-09-30 NOTE — ED Provider Notes (Signed)
[facility] EMERGENCY DEPARTMENT Provider Note   CSN: [id number] Arrival date & time: 09/30/21  1828     History  Chief Complaint  Patient presents with   Abdominal Pain    Jonathon [name] is a 86 y.o. male.  HPI     86-year-old male with a history of hypertension, bradycardia with permanent pacemaker placement, multiple myeloma, chronic inflammatory demyelinating polyneuropathy, chronic diastolic congestive heart failure, BPH, presents with concern for abdominal pain, constipation, difficulty urinating for 3 days.  Felt pressure not being able to urinate, trying to have stool but not able to do that 3 days Friday went to urinate 4-5 times, that night used the bathroom again Numbness/fingers and toes from time to time chronically, no new numbness/weakness Was trying to use bike Thursday was last BM, maybe Friday but not since then This morning took a miralax and that didn't help, had some prune juice and no affect, tried to disimpact self No nausea, did try to vomit to relieve symptoms but didn't help Abdominal pain/pressure worse today No dysuria prior to this  No fever Does have back pain, hx multiple myeloma   Past Medical History:  Diagnosis Date   Asthma    BENIGN PROSTATIC HYPERTROPHY, HX OF 10/25/2008   Bone metastases 04/14/2018   BRADYCARDIA 2005   Chronic diastolic congestive heart failure (HCC) 06/05/2016   CIDP (chronic inflammatory demyelinating polyneuropathy) (HCC)    HYPERTENSION 11/21/2006   Iron deficiency anemia, unspecified 04/19/2013   Multiple myeloma (HCC) 04/29/2018   NEPHROLITHIASIS, HX OF 11/21/2006 and 10/15/2008   PACEMAKER, PERMANENT 2005   Gen change 2014 Medtronic Adaptic L dual-chamber pacemaker, serial #[id number]    SVT (supraventricular tachycardia) (HCC)    TOBACCO ABUSE 10/25/2008   Quit 2012     Home Medications Prior to Admission medications   Medication Sig Start Date End Date Taking? Authorizing Provider  senna-docusate  (SENOKOT-S) 8.6-50 MG tablet Take 2 tablets by mouth 2 (two) times daily as needed for mild constipation. 09/30/21  Yes , , MD  acyclovir (ZOVIRAX) 400 MG tablet Take 1 tablet (400 mg total) by mouth 2 (two) times daily. 06/08/20   Kale, Gautam Kishore, MD  amoxicillin-clavulanate (AUGMENTIN) 875-125 MG tablet Take 1 tablet by mouth 2 (two) times daily. 07/13/21   Banks, Shannon R, MD  aspirin EC 81 MG tablet Take 81 mg by mouth daily.    [provider]  diltiazem (TIAZAC) 240 MG 24 hr capsule Take 1 capsule by mouth daily.    [provider]  flecainide (TAMBOCOR) 50 MG tablet Take 75 mg by mouth daily as needed.    [provider]  furosemide (LASIX) 40 MG tablet Take 40 mg by mouth daily.    [provider]  irbesartan (AVAPRO) 300 MG tablet Take 300 mg by mouth daily.    [provider]  memantine (NAMENDA) 5 MG tablet Take 5 mg by mouth 2 (two) times daily.    [provider]  metoprolol tartrate (LOPRESSOR) 50 MG tablet Take 1 tablet (50 mg total) by mouth 2 (two) times daily. 07/16/19   Haviland, Julie, MD  ondansetron (ZOFRAN) 8 MG tablet Take 1 tablet (8 mg total) by mouth 2 (two) times daily as needed (Nausea or vomiting). 06/08/20   Kale, Gautam Kishore, MD  polyethylene glycol (MIRALAX / GLYCOLAX) packet Take 17 g by mouth daily. 08/17/17   Silva Zapata, Edwin, MD  prochlorperazine (COMPAZINE) 10 MG tablet Take 1 tablet (10 mg total) by   mouth every 6 (six) hours as needed (Nausea or vomiting). 06/08/20   Brunetta Genera, MD  REVLIMID 10 MG capsule Take 1 capsule (10 mg) by mouth daily on days 1 - 21 then take 7 days off. Repeat. 09/18/21   Brunetta Genera, MD  Saccharomyces boulardii (FLORASTOR PO) Take 250 mg by mouth 2 (two) times daily.    [provider]  tamsulosin (FLOMAX) 0.4 MG CAPS capsule Take 2 capsules (0.8 mg total) by mouth daily. 08/16/17   Doreatha Lew, MD  traMADol (ULTRAM) 50 MG tablet Take  1 tablet (50 mg total) by mouth every 6 (six) hours as needed. 02/13/21   Brunetta Genera, MD      Allergies    Lexapro [escitalopram oxalate]    Review of Systems   Review of Systems  Physical Exam Updated Vital Signs BP 140/83   Pulse 68   Temp 98.9 F (37.2 C) (Oral)   Resp 18   SpO2 100%  Physical Exam  ED Results / Procedures / Treatments   Labs (all labs ordered are listed, but only abnormal results are displayed) Labs Reviewed  CBC WITH DIFFERENTIAL/PLATELET - Abnormal; Notable for the following components:      Result Value   RBC 4.05 (*)    Hemoglobin 11.9 (*)    HCT 35.3 (*)    RDW 16.1 (*)    Platelets 82 (*)    All other components within normal limits  COMPREHENSIVE METABOLIC PANEL - Abnormal; Notable for the following components:   Glucose, Bld 108 (*)    Calcium 8.8 (*)    All other components within normal limits  URINALYSIS, ROUTINE W REFLEX MICROSCOPIC  LIPASE, BLOOD    EKG None  Radiology CT ABDOMEN PELVIS W CONTRAST  Result Date: 09/30/2021 CLINICAL DATA:  Left lower quadrant abdominal pain EXAM: CT ABDOMEN AND PELVIS WITH CONTRAST TECHNIQUE: Multidetector CT imaging of the abdomen and pelvis was performed using the standard protocol following bolus administration of intravenous contrast. RADIATION DOSE REDUCTION: This exam was performed according to the departmental dose-optimization program which includes automated exposure control, adjustment of the mA and/or kV according to patient size and/or use of iterative reconstruction technique. CONTRAST:  164m OMNIPAQUE IOHEXOL 300 MG/ML  SOLN COMPARISON:  08/10/2014 FINDINGS: Lower chest: Pacer leads noted within the right heart. Global cardiac size within normal limits. The visualized lung bases are clear. Hepatobiliary: Cholelithiasis without pericholecystic inflammatory change. Liver unremarkable. No intra or extrahepatic biliary ductal dilation. Pancreas: Unremarkable Spleen: Unremarkable  Adrenals/Urinary Tract: The adrenal glands are unremarkable. The kidneys are normal in size and position. Multiple simple cortical and parapelvic cysts are seen bilaterally. No follow-up imaging is recommended for these lesions. No enhancing intrarenal masses are seen. No intrarenal or ureteral calculi are identified. No hydronephrosis. The bladder is decompressed with a Foley catheter balloon seen within its lumen. Stomach/Bowel: Moderate descending and sigmoid colonic diverticulosis without superimposed acute inflammatory change. Moderate stool within the rectal vault. The stomach, small bowel, and large bowel are otherwise unremarkable. No evidence of obstruction or focal inflammation. Appendix normal. No free intraperitoneal gas or fluid. Vascular/Lymphatic: Aortic atherosclerosis. No enlarged abdominal or pelvic lymph nodes. Reproductive: Prostate is unremarkable. Other: Tiny fat containing umbilical and left inguinal hernias are present. Macroscopic fat containing subcutaneous mass is seen along the left inguinal crease in keeping with a subcutaneous lipoma measuring roughly 2.1 x 4.1 x 11.2 cm in greatest dimension. Musculoskeletal: The osseous structures are diffusely osteopenic. Superimposed innumerable lytic  lesions are seen throughout the visualized axial skeleton and proximal femurs bilaterally. The findings are most in keeping with the patient's known multiple myeloma. Index lesion within the left sacral ala measures 2.0 x 2.9 cm, stable since prior examination of 08/06/2021. No pathologic fracture identified IMPRESSION: Innumerable lytic lesions throughout the visualized axial skeleton grossly stable since PET CT examination of 08/06/2021 in keeping the patient's known myeloma. No pathologic fracture identified. Cholelithiasis. Moderate distal colonic diverticulosis without superimposed acute inflammatory change. Moderate stool burden within the rectal vault without evidence of obstruction. 11.2 cm  subcutaneous lipoma within the left inguinal crease. Aortic Atherosclerosis (ICD10-I70.0). Electronically Signed   By: Fidela Salisbury M.D.   On: 09/30/2021 20:38    Procedures Procedures    Medications Ordered in ED Medications  iohexol (OMNIPAQUE) 300 MG/ML solution 100 mL (100 mLs Intravenous Contrast Given 09/30/21 2004)    ED Course/ Medical Decision Making/ A&P                           Medical Decision Making Amount and/or Complexity of Data Reviewed Labs: ordered. Radiology: ordered.  Risk OTC drugs. Prescription drug management.   86 year old male with a history of hypertension, bradycardia with permanent pacemaker placement, multiple myeloma, chronic inflammatory demyelinating polyneuropathy, chronic diastolic congestive heart failure, BPH, presents with concern for abdominal pain, constipation, difficulty urinating for 3 days.  .DDx includes appendicitis, pancreatitis, cholecystitis, pyelonephritis, nephrolithiasis, diverticulitis, AAA, SBO, colonic obstruction, urinary retention, constipation.    Presents with urinary retention.  Foley cathter placed with significant urine output. Pain significantly reduced but still has some soreness.  Labs show no evidence of hepatitis, pancreatitis or AKI.  CT abdomen pelvis shows  grossly stable lytic lesions of spine without pathologic fracture, moderate stool burden, no other acute abnormality.    Suspect urinary retention secondary to known BPH.  No weakness or new numbness to legs and do not suspect cauda equina or acute surgical spinal pathology.  Is taking flomax.    Will dc with foley catheter and urology follow up, constipation treatment with aggressive miralax, mag citrate as needed and senna-docusate.  Patient discharged in stable condition with understanding of reasons to return.         Final Clinical Impression(s) / ED Diagnoses Final diagnoses:  Constipation, unspecified constipation type  Urinary retention     Rx / DC Orders ED Discharge Orders          Ordered    senna-docusate (SENOKOT-S) 8.6-50 MG tablet  2 times daily PRN        09/30/21 2120    magnesium citrate SOLN   Once        09/30/21 2120              Gareth Morgan, MD 10/01/21 1610

## 2021-10-03 ENCOUNTER — Ambulatory Visit (INDEPENDENT_AMBULATORY_CARE_PROVIDER_SITE_OTHER): Payer: Medicare Other

## 2021-10-03 DIAGNOSIS — I495 Sick sinus syndrome: Secondary | ICD-10-CM

## 2021-10-03 DIAGNOSIS — Z23 Encounter for immunization: Secondary | ICD-10-CM | POA: Diagnosis not present

## 2021-10-03 LAB — CUP PACEART REMOTE DEVICE CHECK
Battery Impedance: 1610 Ohm
Battery Remaining Longevity: 42 mo
Battery Voltage: 2.74 V
Brady Statistic AP VP Percent: 29 %
Brady Statistic AP VS Percent: 0 %
Brady Statistic AS VP Percent: 60 %
Brady Statistic AS VS Percent: 11 %
Date Time Interrogation Session: 20230524061920
Implantable Lead Implant Date: 20050121
Implantable Lead Implant Date: 20050121
Implantable Lead Location: 753859
Implantable Lead Location: 753860
Implantable Lead Model: 5076
Implantable Lead Model: 5076
Implantable Pulse Generator Implant Date: 20140207
Lead Channel Impedance Value: 483 Ohm
Lead Channel Impedance Value: 525 Ohm
Lead Channel Pacing Threshold Amplitude: 0.5 V
Lead Channel Pacing Threshold Amplitude: 1.125 V
Lead Channel Pacing Threshold Pulse Width: 0.4 ms
Lead Channel Pacing Threshold Pulse Width: 0.4 ms
Lead Channel Setting Pacing Amplitude: 2.25 V
Lead Channel Setting Pacing Amplitude: 2.5 V
Lead Channel Setting Pacing Pulse Width: 0.4 ms
Lead Channel Setting Sensing Sensitivity: 2 mV

## 2021-10-04 ENCOUNTER — Other Ambulatory Visit: Payer: Self-pay

## 2021-10-04 ENCOUNTER — Telehealth: Payer: Self-pay

## 2021-10-04 DIAGNOSIS — C9001 Multiple myeloma in remission: Secondary | ICD-10-CM

## 2021-10-04 MED FILL — Dexamethasone Sodium Phosphate Inj 100 MG/10ML: INTRAMUSCULAR | Qty: 2 | Status: AC

## 2021-10-04 NOTE — Telephone Encounter (Signed)
Spoke with patients daughter informed her the new atrial fibrillation that was noted on patient pacemaker report, informed her that this could put patient at risk of stroke patients daughter agreeable to appointment on 10/11/21 at 11:00AM with AF clinic  to discuss [place]. Number and directions to AF clinic given to patients daughter.

## 2021-10-05 ENCOUNTER — Inpatient Hospital Stay: Payer: Medicare Other

## 2021-10-05 ENCOUNTER — Other Ambulatory Visit: Payer: Self-pay

## 2021-10-05 VITALS — BP 153/83 | HR 71 | Temp 98.9°F | Resp 18 | Wt 210.8 lb

## 2021-10-05 DIAGNOSIS — Z5112 Encounter for antineoplastic immunotherapy: Secondary | ICD-10-CM | POA: Diagnosis not present

## 2021-10-05 DIAGNOSIS — D72819 Decreased white blood cell count, unspecified: Secondary | ICD-10-CM | POA: Diagnosis not present

## 2021-10-05 DIAGNOSIS — D649 Anemia, unspecified: Secondary | ICD-10-CM | POA: Diagnosis not present

## 2021-10-05 DIAGNOSIS — C9001 Multiple myeloma in remission: Secondary | ICD-10-CM

## 2021-10-05 DIAGNOSIS — Z7189 Other specified counseling: Secondary | ICD-10-CM

## 2021-10-05 DIAGNOSIS — C7951 Secondary malignant neoplasm of bone: Secondary | ICD-10-CM | POA: Diagnosis not present

## 2021-10-05 DIAGNOSIS — C9002 Multiple myeloma in relapse: Secondary | ICD-10-CM | POA: Diagnosis not present

## 2021-10-05 DIAGNOSIS — D696 Thrombocytopenia, unspecified: Secondary | ICD-10-CM | POA: Diagnosis not present

## 2021-10-05 LAB — CMP (CANCER CENTER ONLY)
ALT: 22 U/L (ref 0–44)
AST: 19 U/L (ref 15–41)
Albumin: 3.7 g/dL (ref 3.5–5.0)
Alkaline Phosphatase: 110 U/L (ref 38–126)
Anion gap: 7 (ref 5–15)
BUN: 17 mg/dL (ref 8–23)
CO2: 28 mmol/L (ref 22–32)
Calcium: 9.2 mg/dL (ref 8.9–10.3)
Chloride: 102 mmol/L (ref 98–111)
Creatinine: 1.16 mg/dL (ref 0.61–1.24)
GFR, Estimated: >60 mL/min (ref >60)
Glucose, Bld: 98 mg/dL (ref 70–99)
Potassium: 3.9 mmol/L (ref 3.5–5.1)
Sodium: 137 mmol/L (ref 135–145)
Total Bilirubin: 1 mg/dL (ref 0.3–1.2)
Total Protein: 6.3 g/dL — ABNORMAL LOW (ref 6.5–8.1)

## 2021-10-05 LAB — CBC WITH DIFFERENTIAL (CANCER CENTER ONLY)
Abs Immature Granulocytes: 0.01 10*3/uL (ref 0.00–0.07)
Basophils Absolute: 0 10*3/uL (ref 0.0–0.1)
Basophils Relative: 0 %
Eosinophils Absolute: 0.1 10*3/uL (ref 0.0–0.5)
Eosinophils Relative: 4 %
HCT: 31.4 % — ABNORMAL LOW (ref 39.0–52.0)
Hemoglobin: 10.5 g/dL — ABNORMAL LOW (ref 13.0–17.0)
Immature Granulocytes: 0 %
Lymphocytes Relative: 20 %
Lymphs Abs: 0.7 10*3/uL (ref 0.7–4.0)
MCH: 29.2 pg (ref 26.0–34.0)
MCHC: 33.4 g/dL (ref 30.0–36.0)
MCV: 87.5 fL (ref 80.0–100.0)
Monocytes Absolute: 0.5 10*3/uL (ref 0.1–1.0)
Monocytes Relative: 14 %
Neutro Abs: 2.1 10*3/uL (ref 1.7–7.7)
Neutrophils Relative %: 62 %
Platelet Count: 90 10*3/uL — ABNORMAL LOW (ref 150–400)
RBC: 3.59 MIL/uL — ABNORMAL LOW (ref 4.22–5.81)
RDW: 15.9 % — ABNORMAL HIGH (ref 11.5–15.5)
WBC Count: 3.4 10*3/uL — ABNORMAL LOW (ref 4.0–10.5)
nRBC: 0 % (ref 0.0–0.2)

## 2021-10-05 MED ORDER — SODIUM CHLORIDE 0.9 % IV SOLN
20.0000 mg | Freq: Once | INTRAVENOUS | Status: AC
  Administered 2021-10-05: 20 mg via INTRAVENOUS
  Filled 2021-10-05: qty 20

## 2021-10-05 MED ORDER — SODIUM CHLORIDE 0.9 % IV SOLN
16.0000 mg/kg | Freq: Once | INTRAVENOUS | Status: AC
  Administered 2021-10-05: 1600 mg via INTRAVENOUS
  Filled 2021-10-05: qty 80

## 2021-10-05 MED ORDER — SODIUM CHLORIDE 0.9 % IV SOLN: Freq: Once | INTRAVENOUS | Status: AC

## 2021-10-05 MED ORDER — ACETAMINOPHEN 325 MG PO TABS
650.0000 mg | ORAL_TABLET | Freq: Once | ORAL | Status: AC
  Administered 2021-10-05: 650 mg via ORAL
  Filled 2021-10-05: qty 2

## 2021-10-05 MED ORDER — DIPHENHYDRAMINE HCL 25 MG PO CAPS
50.0000 mg | ORAL_CAPSULE | Freq: Once | ORAL | Status: AC
  Administered 2021-10-05: 50 mg via ORAL
  Filled 2021-10-05: qty 2

## 2021-10-05 MED ORDER — FAMOTIDINE IN NACL 20-0.9 MG/50ML-% IV SOLN
20.0000 mg | Freq: Once | INTRAVENOUS | Status: AC
  Administered 2021-10-05: 20 mg via INTRAVENOUS
  Filled 2021-10-05: qty 50

## 2021-10-05 MED ORDER — MONTELUKAST SODIUM 10 MG PO TABS
10.0000 mg | ORAL_TABLET | Freq: Once | ORAL | Status: AC
  Administered 2021-10-05: 10 mg via ORAL
  Filled 2021-10-05: qty 1

## 2021-10-05 NOTE — Telephone Encounter (Signed)
Left message to return phone call.

## 2021-10-05 NOTE — Progress Notes (Signed)
Ok to treat with platelets of 90 per Dr. Irene Limbo.

## 2021-10-05 NOTE — Progress Notes (Unsigned)
Patient does not have port. Labs drawn peripherally.

## 2021-10-05 NOTE — Patient Instructions (Signed)
[facility] MEDICAL ONCOLOGY  Discharge Instructions: °Thank you for choosing [facility] to provide your oncology and hematology care.  ° °If you have a lab appointment with the Cancer Center, please go directly to the Cancer Center and check in at the registration area. °  °Wear comfortable clothing and clothing appropriate for easy access to any Portacath or PICC line.  ° °We strive to give you quality time with your provider. You may need to reschedule your appointment if you arrive late (15 or more minutes).  Arriving late affects you and other patients whose appointments are after yours.  Also, if you miss three or more appointments without notifying the office, you may be dismissed from the clinic at the provider’s discretion.    °  °For prescription refill requests, have your pharmacy contact our office and allow 72 hours for refills to be completed.   ° °Today you received the following chemotherapy and/or immunotherapy agents: Daratumumab.     °  °To help prevent nausea and vomiting after your treatment, we encourage you to take your nausea medication as directed. ° °BELOW ARE SYMPTOMS THAT SHOULD BE REPORTED IMMEDIATELY: °*FEVER GREATER THAN 100.4 F (38 °C) OR HIGHER °*CHILLS OR SWEATING °*NAUSEA AND VOMITING THAT IS NOT CONTROLLED WITH YOUR NAUSEA MEDICATION °*UNUSUAL SHORTNESS OF BREATH °*UNUSUAL BRUISING OR BLEEDING °*URINARY PROBLEMS (pain or burning when urinating, or frequent urination) °*BOWEL PROBLEMS (unusual diarrhea, constipation, pain near the anus) °TENDERNESS IN MOUTH AND THROAT WITH OR WITHOUT PRESENCE OF ULCERS (sore throat, sores in mouth, or a toothache) °UNUSUAL RASH, SWELLING OR PAIN  °UNUSUAL VAGINAL DISCHARGE OR ITCHING  ° °Items with * indicate a potential emergency and should be followed up as soon as possible or go to the Emergency Department if any problems should occur. ° °Please show the CHEMOTHERAPY ALERT CARD or IMMUNOTHERAPY ALERT CARD at check-in  to the Emergency Department and triage nurse. ° °Should you have questions after your visit or need to cancel or reschedule your appointment, please contact [facility] MEDICAL [facility]COL[facility]  Dept: [phone]  and follow the prompts.  Office hours are 8:00 a.m. to 4:30 p.m. Monday - Friday. Please note that voicemails left after 4:00 p.m. may not be returned until the following business day.  We are closed weekends and major holidays. You have access to a nurse at all times for urgent questions. Please call the main number to the clinic Dept: [phone] and follow the prompts. ° ° °For any non-urgent questions, you may also contact your provider using MyChart. We now offer e-Visits for anyone 18 and older to request care online for non-urgent symptoms. For details visit [email or web address]. °  °Also download the MyChart app! Go to the app store, search "MyChart", open the app, select [facility], and log in with your MyChart username and password. ° °Due to Covid, a mask is required upon entering the hospital/clinic. If you do not have a mask, one will be given to you upon arrival. For doctor visits, patients may have 1 support person aged 18 or older with them. For treatment visits, patients cannot have anyone with them due to current Covid guidelines and our immunocompromised population.  ° °

## 2021-10-09 DIAGNOSIS — M79674 Pain in right toe(s): Secondary | ICD-10-CM | POA: Diagnosis not present

## 2021-10-09 DIAGNOSIS — M79675 Pain in left toe(s): Secondary | ICD-10-CM | POA: Diagnosis not present

## 2021-10-09 DIAGNOSIS — B351 Tinea unguium: Secondary | ICD-10-CM | POA: Diagnosis not present

## 2021-10-09 LAB — MULTIPLE MYELOMA PANEL, SERUM
Albumin SerPl Elph-Mcnc: 3.2 g/dL (ref 2.9–4.4)
Albumin/Glob SerPl: 1.3 (ref 0.7–1.7)
Alpha 1: 0.3 g/dL (ref 0.0–0.4)
Alpha2 Glob SerPl Elph-Mcnc: 0.8 g/dL (ref 0.4–1.0)
B-Globulin SerPl Elph-Mcnc: 0.9 g/dL (ref 0.7–1.3)
Gamma Glob SerPl Elph-Mcnc: 0.6 g/dL (ref 0.4–1.8)
Globulin, Total: 2.5 g/dL (ref 2.2–3.9)
IgA: 182 mg/dL (ref 61–437)
IgG (Immunoglobin G), Serum: 604 mg/dL (ref 603–1613)
IgM (Immunoglobulin M), Srm: 40 mg/dL (ref 15–143)
Total Protein ELP: 5.7 g/dL — ABNORMAL LOW (ref 6.0–8.5)

## 2021-10-11 ENCOUNTER — Ambulatory Visit (HOSPITAL_COMMUNITY)
Admission: RE | Admit: 2021-10-11 | Discharge: 2021-10-11 | Disposition: A | Discharge status: Home / Self Care | Payer: Medicare Other | Source: Ambulatory Visit | Attending: Physician Assistant | Admitting: Physician Assistant

## 2021-10-11 ENCOUNTER — Other Ambulatory Visit: Payer: Self-pay | Admitting: Hematology

## 2021-10-11 ENCOUNTER — Encounter (HOSPITAL_COMMUNITY): Payer: Self-pay | Admitting: Physician Assistant

## 2021-10-11 VITALS — BP 142/80 | HR 77 | Ht 70.0 in | Wt 205.2 lb

## 2021-10-11 DIAGNOSIS — I10 Essential (primary) hypertension: Secondary | ICD-10-CM | POA: Diagnosis not present

## 2021-10-11 DIAGNOSIS — I480 Paroxysmal atrial fibrillation: Secondary | ICD-10-CM | POA: Diagnosis not present

## 2021-10-11 DIAGNOSIS — Z7901 Long term (current) use of anticoagulants: Secondary | ICD-10-CM | POA: Insufficient documentation

## 2021-10-11 DIAGNOSIS — C9000 Multiple myeloma not having achieved remission: Secondary | ICD-10-CM | POA: Diagnosis not present

## 2021-10-11 DIAGNOSIS — Z950 Presence of cardiac pacemaker: Secondary | ICD-10-CM | POA: Insufficient documentation

## 2021-10-11 DIAGNOSIS — L988 Other specified disorders of the skin and subcutaneous tissue: Secondary | ICD-10-CM | POA: Diagnosis not present

## 2021-10-11 DIAGNOSIS — I471 Supraventricular tachycardia: Secondary | ICD-10-CM | POA: Insufficient documentation

## 2021-10-11 DIAGNOSIS — D6859 Other primary thrombophilia: Secondary | ICD-10-CM | POA: Diagnosis not present

## 2021-10-11 DIAGNOSIS — R338 Other retention of urine: Secondary | ICD-10-CM | POA: Diagnosis not present

## 2021-10-11 DIAGNOSIS — D6869 Other thrombophilia: Secondary | ICD-10-CM | POA: Diagnosis not present

## 2021-10-11 DIAGNOSIS — N401 Enlarged prostate with lower urinary tract symptoms: Secondary | ICD-10-CM | POA: Diagnosis not present

## 2021-10-11 NOTE — Progress Notes (Signed)
Primary Care Physician: [name], MD Primary Electrophysiologist: Dr Lovena Le Referring Physician: Device clinic/Dr Maretta Bees is a 86 y.o. male with a history of sinus node dysfunction s/p PPM, multiple myeloma, HTN, atrial tach, atrial fibrillation who presents for consultation in the [facility].  The patient was initially diagnosed with atrial fibrillation 09/2021 on his remote pacemaker interrogation. He had several episodes between 5/14 and 5/21. He was asymptomatic during those episodes. Of note, he has at the ED a few days earlier for urinary retention. Patient has a CHADS2VASC score of 3.  Today, he denies symptoms of palpitations, chest pain, shortness of breath, orthopnea, PND, lower extremity edema, dizziness, presyncope, syncope, snoring, daytime somnolence, bleeding, or neurologic sequela. The patient is tolerating medications without difficulties and is otherwise without complaint today.    Atrial Fibrillation Risk Factors:  he does not have symptoms or diagnosis of sleep apnea. he does not have a history of rheumatic fever.   he has a BMI of Body mass index is 29.44 kg/m.Marland Kitchen Filed Weights   10/11/21 1027  Weight: 93.1 kg    Family History  Problem Relation Age of Onset   Heart attack Father        Died, 78   Kidney disease Father    Parkinson's disease Mother        Died, 26   Breast cancer Sister        Living, 27   Diabetes type II Brother        Living, 17   Breast cancer Sister        Living, 93   Diabetes Daughter        Living, 86   Diabetes Son        Living, 8   Ovarian cancer Daughter    Colon cancer Neg Hx    Rectal cancer Neg Hx    Stomach cancer Neg Hx      Atrial Fibrillation Management history:  Previous antiarrhythmic drugs: none Previous cardioversions: none Previous ablations: none CHADS2VASC score: 3 Anticoagulation history: none   Past Medical History:  Diagnosis Date   Asthma     BENIGN PROSTATIC HYPERTROPHY, HX OF 10/25/2008   Bone metastases 04/14/2018   BRADYCARDIA 2005   Chronic diastolic congestive heart failure ([place]) 06/05/2016   CIDP (chronic inflammatory demyelinating polyneuropathy) ([place])    HYPERTENSION 11/21/2006   Iron deficiency anemia, unspecified 04/19/2013   Multiple myeloma ([place]) 04/29/2018   NEPHROLITHIASIS, HX OF 11/21/2006 and 10/15/2008   PACEMAKER, PERMANENT 2005   Gen change 2014 Medtronic Adaptic L dual-chamber pacemaker, serial #[id number]    SVT (supraventricular tachycardia) ([place])    TOBACCO ABUSE 10/25/2008   Quit 2012   Past Surgical History:  Procedure Laterality Date   COLONOSCOPY  2004,2009   PACEMAKER INSERTION  05/14/2003   PERMANENT PACEMAKER GENERATOR CHANGE N/A 06/19/2012   Procedure: PERMANENT PACEMAKER GENERATOR CHANGE;  Surgeon: [name], MD; Medtronic Adaptic L dual-chamber pacemaker, serial #[id number]     SKIN GRAFT Right 05/13/1960   wrist   TOOTH EXTRACTION  11/10/2020   3 teeth removed    Current Outpatient Medications  Medication Sig Dispense Refill   acyclovir (ZOVIRAX) 400 MG tablet Take 1 tablet (400 mg total) by mouth 2 (two) times daily. 60 tablet 11   aspirin EC 81 MG tablet Take 81 mg by mouth daily.     diltiazem (TIAZAC) 240 MG 24 hr capsule Take 1 capsule by mouth  daily.     flecainide (TAMBOCOR) 50 MG tablet Take 75 mg by mouth daily as needed.     furosemide (LASIX) 40 MG tablet Take 40 mg by mouth daily.     irbesartan (AVAPRO) 300 MG tablet Take 300 mg by mouth daily.     metoprolol tartrate (LOPRESSOR) 50 MG tablet Take 1 tablet (50 mg total) by mouth 2 (two) times daily. 60 tablet 0   ondansetron (ZOFRAN) 8 MG tablet Take 1 tablet (8 mg total) by mouth 2 (two) times daily as needed (Nausea or vomiting). 30 tablet 1   polyethylene glycol (MIRALAX / GLYCOLAX) packet Take 17 g by mouth daily. 14 each 0   prochlorperazine (COMPAZINE) 10 MG tablet Take 1 tablet (10 mg total) by mouth every 6 (six)  hours as needed (Nausea or vomiting). 30 tablet 1   REVLIMID 10 MG capsule TAKE 1 CAPSULE BY MOUTH ONCE DAILY FOR 21 DAYS ON AND 7 DAYS OFF 21 capsule 0   Saccharomyces boulardii (FLORASTOR PO) Take 250 mg by mouth 2 (two) times daily.     senna-docusate (SENOKOT-S) 8.6-50 MG tablet Take 2 tablets by mouth 2 (two) times daily as needed for mild constipation. 30 tablet 0   tamsulosin (FLOMAX) 0.4 MG CAPS capsule Take 2 capsules (0.8 mg total) by mouth daily. 90 capsule 0   traMADol (ULTRAM) 50 MG tablet Take 1 tablet (50 mg total) by mouth every 6 (six) hours as needed. 30 tablet 0   memantine (NAMENDA) 5 MG tablet Take 5 mg by mouth 2 (two) times daily. (Patient not taking: Reported on 10/11/2021)     No current facility-administered medications for this encounter.   Facility-Administered Medications Ordered in Other Encounters  Medication Dose Route Frequency Provider Last Rate Last Admin   [MAR Hold] sodium chloride flush (NS) 0.9 % injection 10 mL  10 mL Intracatheter PRN Brunetta Genera, MD        Allergies  Allergen Reactions   Lexapro [Escitalopram Oxalate]     3/30-08/16/17 ? Serotonin Syndrome    Social History   Socioeconomic History   Marital status: Divorced    Spouse name: Not on file   Number of children: 4   Years of education: Not on file   Highest education level: Not on file  Occupational History   Occupation: ENVIROMENTAL SERVICE    Employer: [employer]   Occupation: retired  Tobacco Use   Smoking status: Former    Packs/day: 0.25    Years: 46.00    Pack years: 11.50    Types: Cigarettes    Start date: 03/16/1965    Quit date: 06/15/2010    Years since quitting: 11.3   Smokeless tobacco: Never   Tobacco comments:    Former smoker 10/11/21 quit 3 years ago  Vaping Use   Vaping Use: Never used  Substance and Sexual Activity   Alcohol use: Yes    Alcohol/week: 6.0 - 10.0 standard drinks    Types: 6 - 10 Glasses of wine per week    Comment:  1-2 glasses every weekend 10/11/21   Drug use: No   Sexual activity: Not on file  Other Topics Concern   Not on file  Social History Narrative   Has relocated from [place] in 2007. 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  Patient in a facility thru [facility]   Social Determinants of Health   Financial Resource Strain: Low Risk    Difficulty of Paying Living Expenses: Not hard at all  Food Insecurity: No Food Insecurity   Worried About Charity fundraiser in the Last Year: Never true   Ran Out of Food in the Last Year: Never true  Transportation Needs: No Transportation Needs   Lack of Transportation (Medical): No   Lack of Transportation (Non-Medical): No  Physical Activity: Not on file  Stress: Not on file  Social Connections: Unknown   Frequency of Communication with Friends and Family: More than three times a week   Frequency of Social Gatherings with Friends and Family: More than three times a week   Attends Religious Services: Not on file   Active Member of Clubs or Organizations: Not on file   Attends Archivist Meetings: Not on file   Marital Status: Not on file  Intimate Partner Violence: Not on file     ROS- All systems are reviewed and negative except as per the HPI above.  Physical Exam: Vitals:   10/11/21 1027  BP: (!) 142/80  Pulse: 77  Weight: 93.1 kg  Height: '5\' 10"'  (1.778 m)    GEN- The patient is a well appearing elderly male, alert and oriented x 3 today.   Head- normocephalic, atraumatic Eyes-  Sclera clear, conjunctiva pink Ears- hearing intact Oropharynx- clear Neck- supple  Lungs- Clear to ausculation bilaterally, normal work of breathing Heart- Regular rate and rhythm, no murmurs, rubs or gallops  GI- soft, NT, ND, + BS Extremities- no clubbing, cyanosis, or edema MS- no significant deformity or atrophy Skin- no rash or lesion Psych- euthymic mood, full affect Neuro- strength and sensation are intact  Wt Readings from Last 3 Encounters:  10/11/21  93.1 kg  10/05/21 95.6 kg  09/20/21 93.5 kg    EKG today demonstrates  A sense V paced rhythm Vent. rate 77 BPM PR interval 334 ms QRS duration 140 ms QT/QTcB 404/457 ms  Echo 08/02/17 demonstrated  Left ventricle: The cavity size was normal. There was mild    concentric hypertrophy. Systolic function was vigorous. The    estimated ejection fraction was in the range of 65% to 70%. Wall    motion was normal; there were no regional wall motion    abnormalities. Doppler parameters are consistent with abnormal    left ventricular relaxation (grade 1 diastolic dysfunction).    There was no evidence of elevated ventricular filling pressure by    Doppler parameters.  - Aortic valve: Trileaflet; moderately thickened, moderately    calcified leaflets. Transvalvular velocity was within the normal    range. There was no stenosis. There was no regurgitation.  - Mitral valve: There was trivial regurgitation.  - Right ventricle: Pacer wire or catheter noted in right ventricle.    Systolic function was normal.  - Right atrium: Pacer wire or catheter noted in right atrium.  - Tricuspid valve: There was mild regurgitation.  - Inferior vena cava: The vessel was normal in size.  - Pericardium, extracardiac: There was no pericardial effusion.  Epic records are reviewed at length today  CHA2DS2-VASc Score = 3  The patient's score is based upon: CHF History: 0 HTN History: 1 Diabetes History: 0 Stroke History: 0 Vascular Disease History: 0 Age Score: 2 Gender Score: 0       ASSESSMENT AND PLAN: 1. Paroxysmal Atrial Fibrillation/SVT The patient's CHA2DS2-VASc score is 3, indicating a 3.2% annual risk of stroke.   General education about afib provided and questions answered. We also discussed his stroke risk and the risks and benefits of anticoagulation. His  platelets on his last CBC 90,000. Patient hesitant to start anticoagulation at this point. Will d/w Dr Irene Limbo (oncology) and Dr Lovena Le  about risks/benefits of anticoagulation. Will hold on starting anticoagulation for now.   Continue diltiazem 240 mg daily Continue Lopressor 50 mg BID Continue flecainide 75 mg daily as needed for SVT  2. Secondary Hypercoagulable State (ICD10:  D68.69) The patient is at significant risk for stroke/thromboembolism based upon his CHA2DS2-VASc Score of 3.  However, the patient is not on anticoagulation presently.     3. HTN Stable, no changes today.  4. Sinus node dysfunction S/p PPM, followed by Dr Lovena Le and the device clinic.   Follow up in the AF clinic in one month if we start Eliquis. Otherwise, follow up with Dr Lovena Le per recall.    [facility] [address] [place], [place] [address] [phone] 10/11/2021 11:32 AM

## 2021-10-12 MED ORDER — SIMETHICONE 125 MG PO CAPS: 125.0000 mg | ORAL_CAPSULE | ORAL | 0 refills | Status: DC | PRN

## 2021-10-12 MED ORDER — SIMETHICONE 125 MG PO CAPS: 125.0000 mg | ORAL_CAPSULE | ORAL | 2 refills | Status: DC | PRN

## 2021-10-12 NOTE — Telephone Encounter (Signed)
Sent medication for gas.

## 2021-10-13 ENCOUNTER — Other Ambulatory Visit: Payer: Self-pay | Admitting: Nurse Practitioner

## 2021-10-16 ENCOUNTER — Other Ambulatory Visit: Payer: Self-pay

## 2021-10-16 ENCOUNTER — Other Ambulatory Visit: Payer: Self-pay | Admitting: Hematology

## 2021-10-16 DIAGNOSIS — C9001 Multiple myeloma in remission: Secondary | ICD-10-CM

## 2021-10-16 DIAGNOSIS — C9000 Multiple myeloma not having achieved remission: Secondary | ICD-10-CM

## 2021-10-16 NOTE — Progress Notes (Signed)
Remote pacemaker transmission.   

## 2021-10-17 DIAGNOSIS — N401 Enlarged prostate with lower urinary tract symptoms: Secondary | ICD-10-CM | POA: Diagnosis not present

## 2021-10-17 DIAGNOSIS — R3914 Feeling of incomplete bladder emptying: Secondary | ICD-10-CM | POA: Diagnosis not present

## 2021-10-17 DIAGNOSIS — R350 Frequency of micturition: Secondary | ICD-10-CM | POA: Diagnosis not present

## 2021-10-17 DIAGNOSIS — R351 Nocturia: Secondary | ICD-10-CM | POA: Diagnosis not present

## 2021-10-17 DIAGNOSIS — R338 Other retention of urine: Secondary | ICD-10-CM | POA: Diagnosis not present

## 2021-10-17 MED FILL — Dexamethasone Sodium Phosphate Inj 100 MG/10ML: INTRAMUSCULAR | Qty: 2 | Status: AC

## 2021-10-18 ENCOUNTER — Inpatient Hospital Stay: Payer: Medicare Other

## 2021-10-18 ENCOUNTER — Other Ambulatory Visit: Payer: Self-pay

## 2021-10-18 ENCOUNTER — Inpatient Hospital Stay: Payer: Medicare Other | Attending: Hematology

## 2021-10-18 VITALS — BP 143/67 | HR 60 | Temp 97.8°F | Resp 18 | Ht 70.0 in | Wt 201.8 lb

## 2021-10-18 DIAGNOSIS — Z5112 Encounter for antineoplastic immunotherapy: Secondary | ICD-10-CM | POA: Insufficient documentation

## 2021-10-18 DIAGNOSIS — C7951 Secondary malignant neoplasm of bone: Secondary | ICD-10-CM | POA: Diagnosis not present

## 2021-10-18 DIAGNOSIS — C9002 Multiple myeloma in relapse: Secondary | ICD-10-CM | POA: Insufficient documentation

## 2021-10-18 DIAGNOSIS — C9001 Multiple myeloma in remission: Secondary | ICD-10-CM

## 2021-10-18 DIAGNOSIS — Z7189 Other specified counseling: Secondary | ICD-10-CM

## 2021-10-18 LAB — CBC WITH DIFFERENTIAL (CANCER CENTER ONLY)
Abs Immature Granulocytes: 0.07 10*3/uL (ref 0.00–0.07)
Basophils Absolute: 0.1 10*3/uL (ref 0.0–0.1)
Basophils Relative: 2 %
Eosinophils Absolute: 0.2 10*3/uL (ref 0.0–0.5)
Eosinophils Relative: 7 %
HCT: 36.3 % — ABNORMAL LOW (ref 39.0–52.0)
Hemoglobin: 11.9 g/dL — ABNORMAL LOW (ref 13.0–17.0)
Immature Granulocytes: 2 %
Lymphocytes Relative: 29 %
Lymphs Abs: 1.1 10*3/uL (ref 0.7–4.0)
MCH: 29 pg (ref 26.0–34.0)
MCHC: 32.8 g/dL (ref 30.0–36.0)
MCV: 88.3 fL (ref 80.0–100.0)
Monocytes Absolute: 0.2 10*3/uL (ref 0.1–1.0)
Monocytes Relative: 6 %
Neutro Abs: 2.1 10*3/uL (ref 1.7–7.7)
Neutrophils Relative %: 54 %
Platelet Count: 200 10*3/uL (ref 150–400)
RBC: 4.11 MIL/uL — ABNORMAL LOW (ref 4.22–5.81)
RDW: 15.6 % — ABNORMAL HIGH (ref 11.5–15.5)
WBC Count: 3.7 10*3/uL — ABNORMAL LOW (ref 4.0–10.5)
nRBC: 0 % (ref 0.0–0.2)

## 2021-10-18 LAB — CMP (CANCER CENTER ONLY)
ALT: 11 U/L (ref 0–44)
AST: 13 U/L — ABNORMAL LOW (ref 15–41)
Albumin: 3.9 g/dL (ref 3.5–5.0)
Alkaline Phosphatase: 113 U/L (ref 38–126)
Anion gap: 9 (ref 5–15)
BUN: 19 mg/dL (ref 8–23)
CO2: 26 mmol/L (ref 22–32)
Calcium: 9.5 mg/dL (ref 8.9–10.3)
Chloride: 103 mmol/L (ref 98–111)
Creatinine: 1.2 mg/dL (ref 0.61–1.24)
GFR, Estimated: >60 mL/min (ref >60)
Glucose, Bld: 104 mg/dL — ABNORMAL HIGH (ref 70–99)
Potassium: 3.5 mmol/L (ref 3.5–5.1)
Sodium: 138 mmol/L (ref 135–145)
Total Bilirubin: 0.5 mg/dL (ref 0.3–1.2)
Total Protein: 7 g/dL (ref 6.5–8.1)

## 2021-10-18 MED ORDER — ACETAMINOPHEN 325 MG PO TABS
650.0000 mg | ORAL_TABLET | Freq: Once | ORAL | Status: AC
  Administered 2021-10-18: 650 mg via ORAL
  Filled 2021-10-18: qty 2

## 2021-10-18 MED ORDER — SODIUM CHLORIDE 0.9% FLUSH: 10.0000 mL | INTRAVENOUS | Status: DC | PRN

## 2021-10-18 MED ORDER — SODIUM CHLORIDE 0.9 % IV SOLN: Freq: Once | INTRAVENOUS | Status: AC

## 2021-10-18 MED ORDER — SODIUM CHLORIDE 0.9 % IV SOLN
20.0000 mg | Freq: Once | INTRAVENOUS | Status: AC
  Administered 2021-10-18: 20 mg via INTRAVENOUS
  Filled 2021-10-18: qty 20

## 2021-10-18 MED ORDER — HEPARIN SOD (PORK) LOCK FLUSH 100 UNIT/ML IV SOLN: 500.0000 [IU] | Freq: Once | INTRAVENOUS | Status: DC | PRN

## 2021-10-18 MED ORDER — MONTELUKAST SODIUM 10 MG PO TABS
10.0000 mg | ORAL_TABLET | Freq: Once | ORAL | Status: AC
  Administered 2021-10-18: 10 mg via ORAL
  Filled 2021-10-18: qty 1

## 2021-10-18 MED ORDER — FAMOTIDINE IN NACL 20-0.9 MG/50ML-% IV SOLN
20.0000 mg | Freq: Once | INTRAVENOUS | Status: AC
  Administered 2021-10-18: 20 mg via INTRAVENOUS
  Filled 2021-10-18: qty 50

## 2021-10-18 MED ORDER — DIPHENHYDRAMINE HCL 25 MG PO CAPS
50.0000 mg | ORAL_CAPSULE | Freq: Once | ORAL | Status: AC
  Administered 2021-10-18: 50 mg via ORAL
  Filled 2021-10-18: qty 2

## 2021-10-18 MED ORDER — SODIUM CHLORIDE 0.9 % IV SOLN
16.0000 mg/kg | Freq: Once | INTRAVENOUS | Status: AC
  Administered 2021-10-18: 1600 mg via INTRAVENOUS
  Filled 2021-10-18: qty 80

## 2021-10-18 NOTE — Patient Instructions (Signed)
[facility]  Discharge Instructions: Thank you for choosing [facility] to provide your oncology and hematology care.   If you have a lab appointment with the [facility], please go directly to the [facility] and check in at the registration area.   Wear comfortable clothing and clothing appropriate for easy access to any Portacath or PICC line.   We strive to give you quality time with your provider. You may need to reschedule your appointment if you arrive late (15 or more minutes).  Arriving late affects you and other patients whose appointments are after yours.  Also, if you miss three or more appointments without notifying the office, you may be dismissed from the clinic at the provider's discretion.      For prescription refill requests, have your pharmacy contact our office and allow 72 hours for refills to be completed.    Today you received the following chemotherapy and/or immunotherapy agent: Daratumumab (Darzalex)   To help prevent nausea and vomiting after your treatment, we encourage you to take your nausea medication as directed.  BELOW ARE SYMPTOMS THAT SHOULD BE REPORTED IMMEDIATELY: *FEVER GREATER THAN 100.4 F (38 C) OR HIGHER *CHILLS OR SWEATING *NAUSEA AND VOMITING THAT IS NOT CONTROLLED WITH YOUR NAUSEA MEDICATION *UNUSUAL SHORTNESS OF BREATH *UNUSUAL BRUISING OR BLEEDING *URINARY PROBLEMS (pain or burning when urinating, or frequent urination) *BOWEL PROBLEMS (unusual diarrhea, constipation, pain near the anus) TENDERNESS IN MOUTH AND THROAT WITH OR WITHOUT PRESENCE OF ULCERS (sore throat, sores in mouth, or a toothache) UNUSUAL RASH, SWELLING OR PAIN  UNUSUAL VAGINAL DISCHARGE OR ITCHING   Items with * indicate a potential emergency and should be followed up as soon as possible or go to the Emergency Department if any problems should occur.  Please show the CHEMOTHERAPY ALERT CARD or IMMUNOTHERAPY ALERT CARD at  check-in to the Emergency Department and triage nurse.  Should you have questions after your visit or need to cancel or reschedule your appointment, please contact [facility]  Dept: [phone]  and follow the prompts.  Office hours are 8:00 a.m. to 4:30 p.m. Monday - Friday. Please note that voicemails left after 4:00 p.m. may not be returned until the following business day.  We are closed weekends and major holidays. You have access to a nurse at all times for urgent questions. Please call the main number to the clinic Dept: [phone] and follow the prompts.   For any non-urgent questions, you may also contact your provider using MyChart. We now offer e-Visits for anyone 68 and older to request care online for non-urgent symptoms. For details visit [email or web address].   Also download the MyChart app! Go to the app store, search "MyChart", open the app, select [facility], and log in with your MyChart username and password.  Due to Covid, a mask is required upon entering the hospital/clinic. If you do not have a mask, one will be given to you upon arrival. For doctor visits, patients may have 1 support person aged 81 or older with them. For treatment visits, patients cannot have anyone with them due to current Covid guidelines and our immunocompromised population.

## 2021-10-18 NOTE — Progress Notes (Signed)
Pt does not have a port. Labs drawn peripherally.

## 2021-10-22 ENCOUNTER — Encounter: Payer: Self-pay | Admitting: Hematology

## 2021-10-30 ENCOUNTER — Other Ambulatory Visit: Payer: Self-pay | Admitting: *Deleted

## 2021-10-30 DIAGNOSIS — C9001 Multiple myeloma in remission: Secondary | ICD-10-CM

## 2021-10-30 MED FILL — Dexamethasone Sodium Phosphate Inj 100 MG/10ML: INTRAMUSCULAR | Qty: 2 | Status: AC

## 2021-10-31 ENCOUNTER — Inpatient Hospital Stay (HOSPITAL_BASED_OUTPATIENT_CLINIC_OR_DEPARTMENT_OTHER): Payer: Medicare Other | Admitting: Hematology

## 2021-10-31 ENCOUNTER — Inpatient Hospital Stay: Payer: Medicare Other

## 2021-10-31 ENCOUNTER — Other Ambulatory Visit: Payer: Self-pay

## 2021-10-31 VITALS — BP 145/85 | HR 73 | Temp 97.3°F | Resp 20 | Wt 200.6 lb

## 2021-10-31 VITALS — BP 133/61 | HR 61 | Temp 97.7°F | Resp 18

## 2021-10-31 DIAGNOSIS — C9001 Multiple myeloma in remission: Secondary | ICD-10-CM

## 2021-10-31 DIAGNOSIS — Z5112 Encounter for antineoplastic immunotherapy: Secondary | ICD-10-CM

## 2021-10-31 DIAGNOSIS — C7951 Secondary malignant neoplasm of bone: Secondary | ICD-10-CM | POA: Diagnosis not present

## 2021-10-31 DIAGNOSIS — C9002 Multiple myeloma in relapse: Secondary | ICD-10-CM | POA: Diagnosis not present

## 2021-10-31 DIAGNOSIS — Z7189 Other specified counseling: Secondary | ICD-10-CM

## 2021-10-31 LAB — CBC WITH DIFFERENTIAL (CANCER CENTER ONLY)
Abs Immature Granulocytes: 0.01 10*3/uL (ref 0.00–0.07)
Basophils Absolute: 0 10*3/uL (ref 0.0–0.1)
Basophils Relative: 1 %
Eosinophils Absolute: 0.5 10*3/uL (ref 0.0–0.5)
Eosinophils Relative: 20 %
HCT: 35 % — ABNORMAL LOW (ref 39.0–52.0)
Hemoglobin: 11.9 g/dL — ABNORMAL LOW (ref 13.0–17.0)
Immature Granulocytes: 0 %
Lymphocytes Relative: 24 %
Lymphs Abs: 0.6 10*3/uL — ABNORMAL LOW (ref 0.7–4.0)
MCH: 29.3 pg (ref 26.0–34.0)
MCHC: 34 g/dL (ref 30.0–36.0)
MCV: 86.2 fL (ref 80.0–100.0)
Monocytes Absolute: 0.2 10*3/uL (ref 0.1–1.0)
Monocytes Relative: 10 %
Neutro Abs: 1.1 10*3/uL — ABNORMAL LOW (ref 1.7–7.7)
Neutrophils Relative %: 45 %
Platelet Count: 88 10*3/uL — ABNORMAL LOW (ref 150–400)
RBC: 4.06 MIL/uL — ABNORMAL LOW (ref 4.22–5.81)
RDW: 15 % (ref 11.5–15.5)
WBC Count: 2.4 10*3/uL — ABNORMAL LOW (ref 4.0–10.5)
nRBC: 0 % (ref 0.0–0.2)

## 2021-10-31 LAB — CMP (CANCER CENTER ONLY)
ALT: 13 U/L (ref 0–44)
AST: 13 U/L — ABNORMAL LOW (ref 15–41)
Albumin: 3.7 g/dL (ref 3.5–5.0)
Alkaline Phosphatase: 113 U/L (ref 38–126)
Anion gap: 7 (ref 5–15)
BUN: 20 mg/dL (ref 8–23)
CO2: 24 mmol/L (ref 22–32)
Calcium: 9.2 mg/dL (ref 8.9–10.3)
Chloride: 104 mmol/L (ref 98–111)
Creatinine: 1.14 mg/dL (ref 0.61–1.24)
GFR, Estimated: >60 mL/min (ref >60)
Glucose, Bld: 106 mg/dL — ABNORMAL HIGH (ref 70–99)
Potassium: 3.9 mmol/L (ref 3.5–5.1)
Sodium: 135 mmol/L (ref 135–145)
Total Bilirubin: 0.8 mg/dL (ref 0.3–1.2)
Total Protein: 6.5 g/dL (ref 6.5–8.1)

## 2021-10-31 MED ORDER — SODIUM CHLORIDE 0.9 % IV SOLN
16.0000 mg/kg | Freq: Once | INTRAVENOUS | Status: AC
  Administered 2021-10-31: 1600 mg via INTRAVENOUS
  Filled 2021-10-31: qty 80

## 2021-10-31 MED ORDER — ACETAMINOPHEN 325 MG PO TABS
650.0000 mg | ORAL_TABLET | Freq: Once | ORAL | Status: AC
  Administered 2021-10-31: 650 mg via ORAL
  Filled 2021-10-31: qty 2

## 2021-10-31 MED ORDER — FAMOTIDINE IN NACL 20-0.9 MG/50ML-% IV SOLN
20.0000 mg | Freq: Once | INTRAVENOUS | Status: AC
  Administered 2021-10-31: 20 mg via INTRAVENOUS
  Filled 2021-10-31: qty 50

## 2021-10-31 MED ORDER — DIPHENHYDRAMINE HCL 25 MG PO CAPS
50.0000 mg | ORAL_CAPSULE | Freq: Once | ORAL | Status: AC
  Administered 2021-10-31: 50 mg via ORAL
  Filled 2021-10-31: qty 2

## 2021-10-31 MED ORDER — SODIUM CHLORIDE 0.9 % IV SOLN: Freq: Once | INTRAVENOUS | Status: AC

## 2021-10-31 MED ORDER — SODIUM CHLORIDE 0.9 % IV SOLN
20.0000 mg | Freq: Once | INTRAVENOUS | Status: AC
  Administered 2021-10-31: 20 mg via INTRAVENOUS
  Filled 2021-10-31: qty 20

## 2021-10-31 MED ORDER — MONTELUKAST SODIUM 10 MG PO TABS
10.0000 mg | ORAL_TABLET | Freq: Once | ORAL | Status: AC
  Administered 2021-10-31: 10 mg via ORAL
  Filled 2021-10-31: qty 1

## 2021-10-31 NOTE — Progress Notes (Signed)
Per Dr. Irene Limbo, [place] to trt w/ ANC 1.1  K/uL and Plts 88 K/uL today

## 2021-10-31 NOTE — Patient Instructions (Signed)
[facility]  Discharge Instructions: Thank you for choosing [facility] to provide your oncology and hematology care.   If you have a lab appointment with the Cancer Center, please go directly to the Cancer Center and check in at the registration area.   Wear comfortable clothing and clothing appropriate for easy access to any Portacath or PICC line.   We strive to give you quality time with your provider. You may need to reschedule your appointment if you arrive late (15 or more minutes).  Arriving late affects you and other patients whose appointments are after yours.  Also, if you miss three or more appointments without notifying the office, you may be dismissed from the clinic at the provider's discretion.      For prescription refill requests, have your pharmacy contact our office and allow 72 hours for refills to be completed.    Today you received the following chemotherapy and/or immunotherapy agents Darzalex      To help prevent nausea and vomiting after your treatment, we encourage you to take your nausea medication as directed.  BELOW ARE SYMPTOMS THAT SHOULD BE REPORTED IMMEDIATELY: *FEVER GREATER THAN 100.4 F (38 C) OR HIGHER *CHILLS OR SWEATING *NAUSEA AND VOMITING THAT IS NOT CONTROLLED WITH YOUR NAUSEA MEDICATION *UNUSUAL SHORTNESS OF BREATH *UNUSUAL BRUISING OR BLEEDING *URINARY PROBLEMS (pain or burning when urinating, or frequent urination) *BOWEL PROBLEMS (unusual diarrhea, constipation, pain near the anus) TENDERNESS IN MOUTH AND THROAT WITH OR WITHOUT PRESENCE OF ULCERS (sore throat, sores in mouth, or a toothache) UNUSUAL RASH, SWELLING OR PAIN  UNUSUAL VAGINAL DISCHARGE OR ITCHING   Items with * indicate a potential emergency and should be followed up as soon as possible or go to the Emergency Department if any problems should occur.  Please show the CHEMOTHERAPY ALERT CARD or IMMUNOTHERAPY ALERT CARD at check-in to  the Emergency Department and triage nurse.  Should you have questions after your visit or need to cancel or reschedule your appointment, please contact [facility]  Dept: [phone]  and follow the prompts.  Office hours are 8:00 a.m. to 4:30 p.m. Monday - Friday. Please note that voicemails left after 4:00 p.m. may not be returned until the following business day.  We are closed weekends and major holidays. You have access to a nurse at all times for urgent questions. Please call the main number to the clinic Dept: [phone] and follow the prompts.   For any non-urgent questions, you may also contact your provider using MyChart. We now offer e-Visits for anyone 18 and older to request care online for non-urgent symptoms. For details visit [email or web address].   Also download the MyChart app! Go to the app store, search "MyChart", open the app, select [facility], and log in with your MyChart username and password.  Masks are optional in the cancer centers. If you would like for your care team to wear a mask while they are taking care of you, please let them know. For doctor visits, patients may have with them one support person who is at least 84 years old. At this time, visitors are not allowed in the infusion area. 

## 2021-11-02 ENCOUNTER — Telehealth: Payer: Self-pay | Admitting: Hematology

## 2021-11-02 ENCOUNTER — Telehealth (HOSPITAL_COMMUNITY): Payer: Self-pay | Admitting: *Deleted

## 2021-11-05 LAB — MULTIPLE MYELOMA PANEL, SERUM
Albumin SerPl Elph-Mcnc: 3.2 g/dL (ref 2.9–4.4)
Albumin/Glob SerPl: 1.2 (ref 0.7–1.7)
Alpha 1: 0.3 g/dL (ref 0.0–0.4)
Alpha2 Glob SerPl Elph-Mcnc: 0.9 g/dL (ref 0.4–1.0)
B-Globulin SerPl Elph-Mcnc: 0.9 g/dL (ref 0.7–1.3)
Gamma Glob SerPl Elph-Mcnc: 0.6 g/dL (ref 0.4–1.8)
Globulin, Total: 2.7 g/dL (ref 2.2–3.9)
IgA: 195 mg/dL (ref 61–437)
IgG (Immunoglobin G), Serum: 645 mg/dL (ref 603–1613)
IgM (Immunoglobulin M), Srm: 44 mg/dL (ref 15–143)
Total Protein ELP: 5.9 g/dL — ABNORMAL LOW (ref 6.0–8.5)

## 2021-11-07 ENCOUNTER — Encounter: Payer: Self-pay | Admitting: Hematology

## 2021-11-07 ENCOUNTER — Other Ambulatory Visit: Payer: Self-pay | Admitting: Hematology

## 2021-11-07 DIAGNOSIS — C9000 Multiple myeloma not having achieved remission: Secondary | ICD-10-CM

## 2021-11-07 NOTE — Progress Notes (Addendum)
HEMATOLOGY/ONCOLOGY CLINIC NOTE  Date of Service:.10/31/2021   Patient Care Team: [name], MD as PCP - General (Family Medicine) [name], MD (Cardiology) [name], MD (Cardiology) [name], DO as Consulting Physician (Neurology)  CHIEF COMPLAINTS/PURPOSE OF CONSULTATION:  Follow-up for continued evaluation and management of multiple myeloma  HISTORY OF PRESENTING ILLNESS:  Please see previous note for details on initial presentation.  INTERVAL HISTORY:  [name] is here for continued evaluation and management of his multiple myeloma.  He notes no acute new symptoms since his last clinic visit.  No infection issues.  No new bone pains.  No significant reported toxicities from his current treatment regimen at this time. Labs done today were discussed in detail with him.  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/2006 and 10/15/2008   PACEMAKER, PERMANENT 2005   Gen change 2014 Medtronic Adaptic L dual-chamber pacemaker, serial #[id number]    SVT (supraventricular tachycardia) ([place])    TOBACCO ABUSE 10/25/2008   Quit 2012    SURGICAL HISTORY: Past Surgical History:  Procedure Laterality Date   COLONOSCOPY  2004,2009   PACEMAKER INSERTION  05/14/2003   PERMANENT PACEMAKER GENERATOR CHANGE N/A 06/19/2012   Procedure: PERMANENT PACEMAKER GENERATOR CHANGE;  Surgeon: [name], MD; Medtronic Adaptic L dual-chamber pacemaker, serial #[id number]     SKIN GRAFT Right 05/13/1960   wrist   TOOTH EXTRACTION  11/10/2020   3 teeth removed    SOCIAL  HISTORY: Social History   Socioeconomic History   Marital status: Divorced    Spouse name: Not on file   Number of children: 4   Years of education: Not on file   Highest education level: Not on file  Occupational History   Occupation: ENVIROMENTAL SERVICE    Employer: [employer]   Occupation: retired  Tobacco Use   Smoking status: Former    Packs/day: 0.25    Years: 46.00    Total pack years: 11.50    Types: Cigarettes    Start date: 03/16/1965    Quit date: 06/15/2010    Years since quitting: 11.4   Smokeless tobacco: Never   Tobacco comments:    Former smoker 10/11/21 quit 3 years ago  Vaping Use   Vaping Use: Never used  Substance and Sexual Activity   Alcohol use: Yes    Alcohol/week: 6.0 - 10.0 standard drinks of alcohol    Types: 6 - 10 Glasses of wine per week    Comment: 1-2 glasses every weekend 10/11/21   Drug use: No   Sexual activity: Not on file  Other Topics Concern   Not on file  Social History Narrative   Has relocated from [place] in 2007. 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  Patient in a facility thru [facility]   Social Determinants of Health   Financial Resource Strain: Low Risk  (03/01/2021)   Overall Financial Resource Strain (CARDIA)    Difficulty of Paying Living Expenses: Not hard at all  Food Insecurity: No Food Insecurity (03/01/2021)   Hunger Vital Sign    Worried About Running Out of Food in the Last  Year: Never true    [place] in the Last Year: Never true  Transportation Needs: No Transportation Needs (03/01/2021)   PRAPARE - Hydrologist (Medical): No    Lack of Transportation (Non-Medical): No  Physical Activity: Not on file  Stress: Not on file  Social Connections: Unknown (03/01/2021)   Social Connection and Isolation Panel [NHANES]    Frequency of Communication with Friends and Family: More than three times a week    Frequency of Social Gatherings with Friends and Family: More than three times a  week    Attends Religious Services: Not on Advertising copywriter or Organizations: Not on file    Attends Archivist Meetings: Not on file    Marital Status: Not on file  Intimate Partner Violence: Not on file    FAMILY HISTORY: Family History  Problem Relation Age of Onset   Heart attack Father        Died, 46   Kidney disease Father    Parkinson's disease Mother        Died, 66   Breast cancer Sister        Living, 47   Diabetes type II Brother        Living, 28   Breast cancer Sister        Living, 21   Diabetes Daughter        Living, 76   Diabetes Son        Living, 85   Ovarian cancer Daughter    Colon cancer Neg Hx    Rectal cancer Neg Hx    Stomach cancer Neg Hx     ALLERGIES:  is allergic to lexapro [escitalopram oxalate].  MEDICATIONS:  Current Outpatient Medications  Medication Sig Dispense Refill   acyclovir (ZOVIRAX) 400 MG tablet Take 1 tablet (400 mg total) by mouth 2 (two) times daily. 60 tablet 11   aspirin EC 81 MG tablet Take 81 mg by mouth daily.     diltiazem (TIAZAC) 240 MG 24 hr capsule Take 1 capsule by mouth daily.     flecainide (TAMBOCOR) 50 MG tablet Take 75 mg by mouth daily as needed.     furosemide (LASIX) 40 MG tablet Take 40 mg by mouth daily.     irbesartan (AVAPRO) 300 MG tablet Take 300 mg by mouth daily.     memantine (NAMENDA) 5 MG tablet Take 5 mg by mouth 2 (two) times daily. (Patient not taking: Reported on 10/11/2021)     metoprolol tartrate (LOPRESSOR) 50 MG tablet Take 1 tablet (50 mg total) by mouth 2 (two) times daily. 60 tablet 0   ondansetron (ZOFRAN) 8 MG tablet Take 1 tablet (8 mg total) by mouth 2 (two) times daily as needed (Nausea or vomiting). 30 tablet 1   polyethylene glycol (MIRALAX / GLYCOLAX) packet Take 17 g by mouth daily. 14 each 0   prochlorperazine (COMPAZINE) 10 MG tablet Take 1 tablet (10 mg total) by mouth every 6 (six) hours as needed (Nausea or vomiting). 30 tablet 1   REVLIMID 10 MG  capsule TAKE 1 CAPSULE BY MOUTH ONCE DAILY FOR 21 DAYS ON AND 7 DAYS OFF 21 capsule 0   Saccharomyces boulardii (FLORASTOR PO) Take 250 mg by mouth 2 (two) times daily.     senna-docusate (SENOKOT-S) 8.6-50 MG tablet Take 2 tablets by mouth 2 (two) times daily as needed for mild constipation. 30 tablet 0  Simethicone 125 MG CAPS Take 1 capsule (125 mg total) by mouth as needed. 28 capsule 2   tamsulosin (FLOMAX) 0.4 MG CAPS capsule Take 2 capsules (0.8 mg total) by mouth daily. 90 capsule 0   traMADol (ULTRAM) 50 MG tablet Take 1 tablet (50 mg total) by mouth every 6 (six) hours as needed. 30 tablet 0   No current facility-administered medications for this visit.   Facility-Administered Medications Ordered in Other Visits  Medication Dose Route Frequency Provider Last Rate Last Admin   [MAR Hold] sodium chloride flush (NS) 0.9 % injection 10 mL  10 mL Intracatheter PRN Brunetta Genera, MD        REVIEW OF SYSTEMS:   10 Point review of Systems was done is negative except as noted above.  PHYSICAL EXAMINATION: ECOG PERFORMANCE STATUS: 2 - Symptomatic, <50% confined to bed  .BP (!) 145/85   Pulse 73   Temp (!) 97.3 F (36.3 C)   Resp 20   Wt 200 lb 9.6 oz (91 kg)   SpO2 100%   BMI 28.78 kg/m  NAD GENERAL:alert, in no acute distress and comfortable SKIN: no acute rashes, no significant lesions EYES: conjunctiva are pink and non-injected, sclera anicteric OROPHARYNX: MMM, no exudates, no oropharyngeal erythema or ulceration NECK: supple, no JVD LYMPH:  no palpable lymphadenopathy in the cervical, axillary or inguinal regions LUNGS: clear to auscultation b/l with normal respiratory effort HEART: regular rate & rhythm ABDOMEN:  normoactive bowel sounds , non tender, not distended. Extremity: no pedal edema PSYCH: alert & oriented x 3 with fluent speech NEURO: no focal motor/sensory deficits   LABORATORY DATA:  I have reviewed the data as listed .    Latest Ref Rng &  Units 10/31/2021   12:13 PM 10/18/2021    9:09 AM 10/05/2021    9:48 AM  CBC  WBC 4.0 - 10.5 K/uL 2.4  3.7  3.4   Hemoglobin 13.0 - 17.0 g/dL 11.9  11.9  10.5   Hematocrit 39.0 - 52.0 % 35.0  36.3  31.4   Platelets 150 - 400 K/uL 88  200  90        Latest Ref Rng & Units 10/31/2021   12:13 PM 10/18/2021    9:09 AM 10/05/2021    9:48 AM  CMP  Glucose 70 - 99 mg/dL 106  104  98   BUN 8 - 23 mg/dL '20  19  17   ' Creatinine 0.61 - 1.24 mg/dL 1.14  1.20  1.16   Sodium 135 - 145 mmol/L 135  138  137   Potassium 3.5 - 5.1 mmol/L 3.9  3.5  3.9   Chloride 98 - 111 mmol/L 104  103  102   CO2 22 - 32 mmol/L '24  26  28   ' Calcium 8.9 - 10.3 mg/dL 9.2  9.5  9.2   Total Protein 6.5 - 8.1 g/dL 6.5  7.0  6.3   Total Bilirubin 0.3 - 1.2 mg/dL 0.8  0.5  1.0   Alkaline Phos 38 - 126 U/L 113  113  110   AST 15 - 41 U/L '13  13  19   ' ALT 0 - 44 U/L '13  11  22       ' 04/17/18 BM Bx:    RADIOGRAPHIC STUDIES: I have personally reviewed the radiological images as listed and agreed with the findings in the report. No results found.  ASSESSMENT & PLAN:   86 y.o. male with  1.  Relapsed multiple myeloma multiple bone metastases   labs upon initial presentation from 04/14/18; hypercalcemic with Calcium at 13.8, renal function up form baseline with Creatinine at 1.48, HGB at 11.3. PSA was normal.   03/31/18 CT C/A/P revealed 1. Lytic lesion throughout all visualized vertebra from the lower cervical spine through the sacrum. Epidural extension of tumor T2, T3 and L2 level. Right L3-4 foraminal extension tumor. 2. Lytic lesions involving majority of ribs. Multiple pathologic rib fractures with bony expansion/sub pleural extension of tumor at several levels. 3. Lytic lesions of the scapula, hips bilaterally and pelvis bilaterally. With further progression of tumor, patient may be at risk for pathologic fractures. Pathologic fracture right clavicle incompletely assessed. 4. Circumferential narrowing sigmoid colon.  Question possibility of underlying mass (series 2, image 97). Alternatively this could represent muscular hypertrophy/peristalsis. 5. Gastric antrum circumferential narrowing. Cannot exclude mass although this may be related to peristalsis. 6. No primary lung malignancy noted. 7. Matted aortic pulmonary window lymph nodes. 8. Circumferential urinary bladder wall thickening greater anterior dome region. Right lateral bladder diverticulum. 9. 5 mm low-density lesion pancreatic body (series 5, image 10) unchanged. This is felt unlikely to be a primary malignancy. 10. Right parapelvic 2.2 cm cyst without significant change. Scattered low-density renal lesions bilaterally too small to adequately characterize. Some were present previously. Statistically these are likely cysts. 11. Stable adrenal gland hyperplasia. 12. Gynecomastia. 13. Prostate gland causes slight impression upon the bladder base. 14.  Aortic Atherosclerosis. 15. Sequential pacemaker in place. 16. Gallstones.   04/23/18 PET/CT revealed Innumerable hypermetabolic lytic lesions throughout the skeleton especially involving the spine, ribs, bony pelvis along with some involvement of both proximal femurs. The appearance is compatible with pathologic diagnosis of plasma cell neoplasm could well reflect multiple myeloma. Resulting pathologic fractures of the right lateral clavicle and of multiple bilateral ribs. Lesions are present along the cortical margins of the spinal canal. 2. Both the area and the stomach in the area in the sigmoid colon drawn attention to on prior CT scan appear benign normal today. 3. The confluence of AP window lymph nodes measures about 1.2 cm in short axis with maximum SUV 3.1, which is mildly above the blood pool merit surveillance. 4. Other imaging findings of potential clinical significance: Aortic Atherosclerosis. Coronary atherosclerosis. Pacemaker noted. Borderline prostatomegaly. Cholelithiasis.  04/21/18 BM Bx revealed  Normocellular bone marrow with Plasma Cell neoplasm. Scattered medium sized clusters of kappa-restricted plasma cells (14% aspirate, 10% CD138 immunohistochemistry.   I do suspect that the burden of disease is underestimated in the bone marrow sample given PET/CT findings of innumerable lytic lesions and an M spike of 3.4g   06/08/18 DG Hips bilat which revealed Innumerable lucencies are noted throughout the pelvis and proximal femurs, similar findings noted on prior PET-CT of 04/23/2018. Findings are consistent patient's known multiple myeloma. Femoral necks are intact. 2.  Aortoiliac atherosclerotic vascular disease.  08/06/2021 PET/CT revealed "1. Interval development of consolidative masslike opacities in both upper lobes demonstrating marked hypermetabolism. While these may be infectious/inflammatory, FDG accumulation is higher than typically seen in that setting. Metastatic disease not excluded. 2. Dominant left rib, right scapula, and left sacral lesions all show marked interval decrease in size and hypermetabolism. 3. Soft tissue nodule in the midline back adjacent to the L4 spinous process is similar in size and hypermetabolism today. 4. Interval decrease in size and hypermetabolism in the gastrohepatic lymph node identified previously. 5. Multiple scattered osseous lesions compatible with treated multiple myeloma. 6. Cholelithiasis."  2.  Pulmonary infiltrates likely related to radiation related change. CT chest was discussed in detail with the patient PLAN:    Patient's labs done today were discussed in detail with him. CBC shows stable hemoglobin of 11.9 with a WBC count of 2.4 ANC of 1.1k and platelets of 88k CMP within normal limits Myeloma panel shows no M spike PET/CT on 08/06/2021 noted  Follow-up Daratumumab today 10/31/2021. Please cancel daratumumab currently scheduled in 2 weeks on 7/6 Switching to daratumumab every 4 weeks from his next dose with port flush and  labs. Please schedule next 4 treatments MD visit in 8 weeks

## 2021-11-08 ENCOUNTER — Other Ambulatory Visit: Payer: Self-pay

## 2021-11-08 ENCOUNTER — Encounter: Payer: Self-pay | Admitting: Hematology

## 2021-11-08 ENCOUNTER — Ambulatory Visit (HOSPITAL_COMMUNITY): Payer: Medicare Other | Admitting: Physician Assistant

## 2021-11-08 MED ORDER — LENALIDOMIDE 5 MG PO CAPS: 5.0000 mg | ORAL_CAPSULE | Freq: Every day | ORAL | 0 refills | Status: DC

## 2021-11-15 ENCOUNTER — Inpatient Hospital Stay: Payer: Medicare Other

## 2021-11-16 ENCOUNTER — Ambulatory Visit (INDEPENDENT_AMBULATORY_CARE_PROVIDER_SITE_OTHER): Payer: Medicare Other | Admitting: Internal Medicine

## 2021-11-16 ENCOUNTER — Encounter: Payer: Self-pay | Admitting: Internal Medicine

## 2021-11-16 VITALS — BP 114/70 | HR 69 | Ht 70.0 in | Wt 202.0 lb

## 2021-11-16 DIAGNOSIS — I480 Paroxysmal atrial fibrillation: Secondary | ICD-10-CM

## 2021-11-16 MED ORDER — APIXABAN 5 MG PO TABS: 5.0000 mg | ORAL_TABLET | Freq: Two times a day (BID) | ORAL | 11 refills | Status: DC

## 2021-11-16 MED ORDER — FLECAINIDE ACETATE 150 MG PO TABS: 75.0000 mg | ORAL_TABLET | Freq: Two times a day (BID) | ORAL | 3 refills | Status: DC

## 2021-11-16 NOTE — Patient Instructions (Addendum)
Medication Instructions:  START Taking: Eliquis 5 mg-  Take one tablet by mouth twice a day  2.   Flecainide 75 mg-  Take 1/2 tablet by mouth twice a day.  3. Diltiazem 240 mg-  If your Blood pressure is less than 315 systolic, DO NOT TAKE this medication.    4. STOP TAKING YOUR ASPIRIN.   Lab Work: None ordered.  If you have labs (blood work) drawn today and your tests are completely normal, you will receive your results only by: [place] (if you have MyChart) OR A paper copy in the mail If you have any lab test that is abnormal or we need to change your treatment, we will call you to review the results.  Testing/Procedures: None ordered.  Follow-Up: At [facility], you and your health needs are our priority.  As part of our continuing mission to provide you with exceptional heart care, we have created designated Provider Care Teams.  These Care Teams include your primary Cardiologist (physician) and Advanced Practice Providers (APPs -  Physician Assistants and Nurse Practitioners) who all work together to provide you with the care you need, when you need it.  We recommend signing up for the patient portal called "MyChart".  Sign up information is provided on this After Visit Summary.  MyChart is used to connect with patients for Virtual Visits (Telemedicine).  Patients are able to view lab/test results, encounter notes, upcoming appointments, etc.  Non-urgent messages can be sent to your provider as well.   To learn more about what you can do with MyChart, go to [email or web address].    Your next appointment:   4 month follow up appointment  Remote monitoring is used to monitor your Pacemaker/ ICD from home. This monitoring reduces the number of office visits required to check your device to one time per year. It allows [place] to keep an eye on the functioning of your device to ensure it is working properly. You are scheduled for a device check from home on 01/02/2022. You may  send your transmission at any time that day. If you have a wireless device, the transmission will be sent automatically. After your physician reviews your transmission, you will receive a postcard with your next transmission date.  Important Information About Sugar      Apixaban Tablets What is this medication? APIXABAN (a PIX a ban) prevents or treats blood clots. It is also used to lower the risk of stroke in people with AFib (atrial fibrillation). It belongs to a group of medications called blood thinners. This medicine may be used for other purposes; ask your health care provider or pharmacist if you have questions. COMMON BRAND NAME(S): Eliquis What should I tell my care team before I take this medication? They need to know if you have any of these conditions: Antiphospholipid antibody syndrome Bleeding disorder History of bleeding in the brain History of blood clots History of stomach bleeding Kidney disease Liver disease Mechanical heart valve Spinal surgery An unusual or allergic reaction to apixaban, other medications, foods, dyes, or preservatives Pregnant or trying to get pregnant Breast-feeding How should I use this medication? Take this medication by mouth. For your therapy to work as well as possible, take each dose exactly as prescribed on the prescription label. Do not skip doses. Skipping doses or stopping this medication can increase your risk of a blood clot or stroke. Keep taking this medication unless your care team tells you to stop. Take it as directed on the  prescription label at the same time every day. You can take it with or without food. If it upsets your stomach, take it with food. A special MedGuide will be given to you by the pharmacist with each prescription and refill. Be sure to read this information carefully each time. Talk to your care team about the use of this medication in children. Special care may be needed. Overdosage: If you think you have  taken too much of this medicine contact a poison control center or emergency room at once. NOTE: This medicine is only for you. Do not share this medicine with others. What if I miss a dose? If you miss a dose, take it as soon as you can. If it is almost time for your next dose, take only that dose. Do not take double or extra doses. What may interact with this medication? This medication may interact with the following: Aspirin and aspirin-like medications Certain medications for fungal infections like itraconazole and ketoconazole Certain medications for seizures like carbamazepine and phenytoin Certain medications for blood clots like enoxaparin, dalteparin, heparin, and warfarin Clarithromycin NSAIDs, medications for pain and inflammation, like ibuprofen or naproxen Rifampin Ritonavir St. John's wort This list may not describe all possible interactions. Give your health care provider a list of all the medicines, herbs, non-prescription drugs, or dietary supplements you use. Also tell them if you smoke, drink alcohol, or use illegal drugs. Some items may interact with your medicine. What should I watch for while using this medication? Visit your healthcare professional for regular checks on your progress. You may need blood work done while you are taking this medication. Your condition will be monitored carefully while you are receiving this medication. It is important not to miss any appointments. Avoid sports and activities that might cause injury while you are using this medication. Severe falls or injuries can cause unseen bleeding. Be careful when using sharp tools or knives. Consider using an Copy. Take special care brushing or flossing your teeth. Report any injuries, bruising, or red spots on the skin to your healthcare professional. If you are going to need surgery or other procedure, tell your healthcare professional that you are taking this medication. Wear a medical ID  bracelet or chain. Carry a card that describes your disease and details of your medication and dosage times. What side effects may I notice from receiving this medication? Side effects that you should report to your care team as soon as possible: Allergic reactions--skin rash, itching, hives, swelling of the face, lips, tongue, or throat Bleeding--bloody or black, tar-like stools, vomiting blood or brown material that looks like coffee grounds, red or dark brown urine, small red or purple spots on the skin, unusual bruising or bleeding Bleeding in the brain--severe headache, stiff neck, confusion, dizziness, change in vision, numbness or weakness of the face, arm, or leg, trouble speaking, trouble walking, vomiting Heavy periods This list may not describe all possible side effects. Call your doctor for medical advice about side effects. You may report side effects to FDA at 1-800-FDA-1088. Where should I keep my medication? Keep out of the reach of children and pets. Store at room temperature between 20 and 25 degrees C (68 and 77 degrees F). Get rid of any unused medication after the expiration date. To get rid of medications that are no longer needed or expired: Take the medication to a medication take-back program. Check with your pharmacy or law enforcement to find a location. If you cannot return  the medication, check the label or package insert to see if the medication should be thrown out in the garbage or flushed down the toilet. If you are not sure, ask your care team. If it is safe to put in the trash, empty the medication out of the container. Mix the medication with cat litter, dirt, coffee grounds, or other unwanted substance. Seal the mixture in a bag or container. Put it in the trash. NOTE: This sheet is a summary. It may not cover all possible information. If you have questions about this medicine, talk to your doctor, pharmacist, or health care provider.  2023 Elsevier/Gold Standard  (2020-05-26 00:00:00)

## 2021-11-16 NOTE — Progress Notes (Unsigned)
HPI [name] returns today for followup. He is a pleasant 86 yo man with a h/o sinus node dysfunction, PAF/AT and HTN, s/p PPM insertion. He has CIDP and recently diagnosed with multiple myeloma and is on immune therapy. Despite his serious medical problems he continues to maintain a very positive attitude. He had gained weight but then lost some. He has not had chest pain or sob. No edema. he was diagnosed with atrial fib with a CVR. I last saw him over 3 years ago. He did not have palpitations when he went out of rhythm but he felt poorly with some sob and chest pressure. None now.  Allergies  Allergen Reactions   Lexapro [Escitalopram Oxalate]     3/30-08/16/17 ? Serotonin Syndrome     Current Outpatient Medications  Medication Sig Dispense Refill   acyclovir (ZOVIRAX) 400 MG tablet Take 1 tablet (400 mg total) by mouth 2 (two) times daily. 60 tablet 11   aspirin EC 81 MG tablet Take 81 mg by mouth daily.     diltiazem (TIAZAC) 240 MG 24 hr capsule Take 1 capsule by mouth daily.     flecainide (TAMBOCOR) 50 MG tablet Take 75 mg by mouth daily as needed.     furosemide (LASIX) 40 MG tablet Take 40 mg by mouth daily.     irbesartan (AVAPRO) 300 MG tablet Take 300 mg by mouth daily.     lenalidomide (REVLIMID) 5 MG capsule Take 1 capsule (5 mg total) by mouth daily. 21 capsule 0   metoprolol tartrate (LOPRESSOR) 50 MG tablet Take 1 tablet (50 mg total) by mouth 2 (two) times daily. 60 tablet 0   ondansetron (ZOFRAN) 8 MG tablet Take 1 tablet (8 mg total) by mouth 2 (two) times daily as needed (Nausea or vomiting). 30 tablet 1   polyethylene glycol (MIRALAX / GLYCOLAX) packet Take 17 g by mouth daily. 14 each 0   prochlorperazine (COMPAZINE) 10 MG tablet Take 1 tablet (10 mg total) by mouth every 6 (six) hours as needed (Nausea or vomiting). 30 tablet 1   Saccharomyces boulardii (FLORASTOR PO) Take 250 mg by mouth 2 (two) times daily.     senna-docusate (SENOKOT-S) 8.6-50 MG tablet  Take 2 tablets by mouth 2 (two) times daily as needed for mild constipation. 30 tablet 0   tamsulosin (FLOMAX) 0.4 MG CAPS capsule Take 2 capsules (0.8 mg total) by mouth daily. 90 capsule 0   traMADol (ULTRAM) 50 MG tablet Take 1 tablet (50 mg total) by mouth every 6 (six) hours as needed. 30 tablet 0   No current facility-administered medications for this visit.   Facility-Administered Medications Ordered in Other Visits  Medication Dose Route Frequency Provider Last Rate Last Admin   [MAR Hold] sodium chloride flush (NS) 0.9 % injection 10 mL  10 mL Intracatheter PRN Brunetta Genera, MD         Past Medical History:  Diagnosis Date   Asthma    BENIGN PROSTATIC HYPERTROPHY, HX OF 10/25/2008   Bone metastases 04/14/2018   BRADYCARDIA 2005   Chronic diastolic congestive heart failure ([place]) 06/05/2016   CIDP (chronic inflammatory demyelinating polyneuropathy) ([place])    HYPERTENSION 11/21/2006   Iron deficiency anemia, unspecified 04/19/2013   Multiple myeloma ([place]) 04/29/2018   NEPHROLITHIASIS, HX OF 11/21/2006 and 10/15/2008   PACEMAKER, PERMANENT 2005   Gen change 2014 Medtronic Adaptic L dual-chamber pacemaker, serial #[id number]    SVT (supraventricular tachycardia) ([place])    TOBACCO  ABUSE 10/25/2008   Quit 2012    ROS:   All systems reviewed and negative except as noted in the HPI.   Past Surgical History:  Procedure Laterality Date   COLONOSCOPY  2004,2009   PACEMAKER INSERTION  05/14/2003   PERMANENT PACEMAKER GENERATOR CHANGE N/A 06/19/2012   Procedure: PERMANENT PACEMAKER GENERATOR CHANGE;  Surgeon: [name], MD; Medtronic Adaptic L dual-chamber pacemaker, serial #[id number]     SKIN GRAFT Right 05/13/1960   wrist   TOOTH EXTRACTION  11/10/2020   3 teeth removed     Family History  Problem Relation Age of Onset   Heart attack Father        Died, 44   Kidney disease Father    Parkinson's disease Mother        Died, 14   Breast cancer Sister         Living, 35   Diabetes type II Brother        Living, 60   Breast cancer Sister        Living, 8   Diabetes Daughter        Living, 41   Diabetes Son        Living, 48   Ovarian cancer Daughter    Colon cancer Neg Hx    Rectal cancer Neg Hx    Stomach cancer Neg Hx      Social History   Socioeconomic History   Marital status: Divorced    Spouse name: Not on file   Number of children: 4   Years of education: Not on file   Highest education level: Not on file  Occupational History   Occupation: ENVIROMENTAL SERVICE    Employer: [employer]   Occupation: retired  Tobacco Use   Smoking status: Former    Packs/day: 0.25    Years: 46.00    Total pack years: 11.50    Types: Cigarettes    Start date: 03/16/1965    Quit date: 06/15/2010    Years since quitting: 11.4   Smokeless tobacco: Never   Tobacco comments:    Former smoker 10/11/21 quit 3 years ago  Vaping Use   Vaping Use: Never used  Substance and Sexual Activity   Alcohol use: Yes    Alcohol/week: 6.0 - 10.0 standard drinks of alcohol    Types: 6 - 10 Glasses of wine per week    Comment: 1-2 glasses every weekend 10/11/21   Drug use: No   Sexual activity: Not on file  Other Topics Concern   Not on file  Social History Narrative   Has relocated from [place] in 2007. 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  Patient in a facility thru [facility]   Social Determinants of Health   Financial Resource Strain: Low Risk  (03/01/2021)   Overall Financial Resource Strain (CARDIA)    Difficulty of Paying Living Expenses: Not hard at all  Food Insecurity: No Food Insecurity (03/01/2021)   Hunger Vital Sign    Worried About Running Out of Food in the Last Year: Never true    Ran Out of Food in the Last Year: Never true  Transportation Needs: No Transportation Needs (03/01/2021)   PRAPARE - Hydrologist (Medical): No    Lack of Transportation (Non-Medical): No  Physical Activity: Not on file  Stress:  Not on file  Social Connections: Unknown (03/01/2021)   Social Connection and Isolation Panel [NHANES]    Frequency of Communication with Friends and Family:  More than three times a week    Frequency of Social Gatherings with Friends and Family: More than three times a week    Attends Religious Services: Not on file    Active Member of Clubs or Organizations: Not on file    Attends Archivist Meetings: Not on file    Marital Status: Not on file  Intimate Partner Violence: Not on file     BP 114/70   Pulse 69   Ht '5\' 10"'  (1.778 m)   Wt 202 lb (91.6 kg)   SpO2 98%   BMI 28.98 kg/m   Physical Exam:  Well appearing NAD HEENT: Unremarkable Neck:  No JVD, no thyromegally Lymphatics:  No adenopathy Back:  No CVA tenderness Lungs:  Clear with no wheezes HEART:  Regular rate rhythm, no murmurs, no rubs, no clicks Abd:  soft, positive bowel sounds, no organomegally, no rebound, no guarding Ext:  2 plus pulses, no edema, no cyanosis, no clubbing Skin:  No rashes no nodules Neuro:  CN II through XII intact, motor grossly intact   DEVICE  Normal device function.  See PaceArt for details.   Assess/Plan:  PAF - I discussed the treatment options in detail. I have asked him to increase the flecainide to 75 mg twice daily. Coags - I asked him to start eliquis 5 mg twice daily. I discussed the rationale and risk of stroke. His CHADSVASC is 3.  HTN - his bp is now low and I asked him to stop cardizem if his SBP runs below 100.  PPM - his medtronic DDD PM is working normally. We will follow.  Carleene Overlie Jahaan Vanwagner,MD

## 2021-11-21 ENCOUNTER — Encounter: Payer: Self-pay | Admitting: Family Medicine

## 2021-11-21 ENCOUNTER — Ambulatory Visit (INDEPENDENT_AMBULATORY_CARE_PROVIDER_SITE_OTHER): Payer: Medicare Other | Admitting: Family Medicine

## 2021-11-21 ENCOUNTER — Other Ambulatory Visit: Payer: Self-pay | Admitting: Hematology

## 2021-11-21 VITALS — BP 130/68 | HR 60 | Temp 98.2°F | Wt 208.0 lb

## 2021-11-21 DIAGNOSIS — C9000 Multiple myeloma not having achieved remission: Secondary | ICD-10-CM

## 2021-11-21 DIAGNOSIS — M10442 Other secondary gout, left hand: Secondary | ICD-10-CM | POA: Diagnosis not present

## 2021-11-21 DIAGNOSIS — M7989 Other specified soft tissue disorders: Secondary | ICD-10-CM

## 2021-11-21 MED ORDER — COLCHICINE 0.6 MG PO TABS: ORAL_TABLET | ORAL | 0 refills | Status: DC

## 2021-11-21 NOTE — Progress Notes (Signed)
Subjective:    Patient ID: Jonathon [name], male    DOB: [birth year], 86 y.o.   MRN: [id number]  Chief Complaint  Patient presents with   Joint Swelling    Patient c/o of swelling on L ring finger. Going on for 2 wks.   Patient accompanied by his daughter Jonathon [name].  HPI Patient is an 86 year old male with  pmh sig for history of gout, SVT with pacer in place, HTN, asthma, MM with meds to bone who was seen today for acute concern. Pt with edema of L 4th digit x 2 wks. pt endorses discomfort of finger 2/2 edema.  Denies injury or insect bite.  Patient endorses eating shrimp, mushrooms, souse meat, and drinking beer over the last few wks.  Pt's daughter inquires if there is a way for pt to get an adjustable bed as he currently sleeps in a recliner due to back pain/being uncomfortable in the bed.  Past Medical History:  Diagnosis Date   Asthma    BENIGN PROSTATIC HYPERTROPHY, HX OF 10/25/2008   Bone metastases 04/14/2018   BRADYCARDIA 2005   Chronic diastolic congestive heart failure ([place]) 06/05/2016   CIDP (chronic inflammatory demyelinating polyneuropathy) ([place])    HYPERTENSION 11/21/2006   Iron deficiency anemia, unspecified 04/19/2013   Multiple myeloma ([place]) 04/29/2018   NEPHROLITHIASIS, HX OF 11/21/2006 and 10/15/2008   PACEMAKER, PERMANENT 2005   Gen change 2014 Medtronic Adaptic L dual-chamber pacemaker, serial #[id number]    SVT (supraventricular tachycardia) ([place])    TOBACCO ABUSE 10/25/2008   Quit 2012    Allergies  Allergen Reactions   Lexapro [Escitalopram Oxalate]     3/30-08/16/17 ? Serotonin Syndrome    ROS General: Denies fever, chills, night sweats, changes in weight, changes in appetite HEENT: Denies headaches, ear pain, changes in vision, rhinorrhea, sore throat CV: Denies CP, palpitations, SOB, orthopnea Pulm: Denies SOB, cough, wheezing GI: Denies abdominal pain, nausea, vomiting, diarrhea, constipation GU: Denies dysuria, hematuria, frequency Msk: Denies muscle cramps,  joint pains Neuro: Denies weakness, numbness, tingling Skin: Denies rashes, bruising  + edema, discomfort left fourth digit. Psych: Denies depression, anxiety, hallucinations     Objective:    Blood pressure 130/68, pulse 60, temperature 98.2 F (36.8 C), temperature source Oral, weight 208 lb (94.3 kg), SpO2 98 %.  Gen. Pleasant, well-nourished, in no distress, normal affect   HEENT: [place]/AT, face symmetric, conjunctiva clear, no scleral icterus, PERRLA, EOMI, nares patent without drainage Lungs: no accessory muscle use Cardiovascular: RRR, no peripheral edema Musculoskeletal: Mild edema of dorsum of R foot.  No deformities, no cyanosis or clubbing, normal tone Neuro:  A&Ox3, CN II-XII intact, normal gait Skin:  Warm, no lesions/ rash.  Left fourth digit and proximal dorsum of hand with edema, mild erythema, mildly increased warmth   Wt Readings from Last 3 Encounters:  11/21/21 208 lb (94.3 kg)  11/16/21 202 lb (91.6 kg)  10/31/21 200 lb 9.6 oz (91 kg)    Lab Results  Component Value Date   WBC 2.4 (L) 10/31/2021   HGB 11.9 (L) 10/31/2021   HCT 35.0 (L) 10/31/2021   PLT 88 (L) 10/31/2021   GLUCOSE 106 (H) 10/31/2021   CHOL 202 (H) 01/14/2013   TRIG 206.0 (H) 01/14/2013   HDL 53.60 01/14/2013   LDLDIRECT 126.6 01/14/2013   ALT 13 10/31/2021   AST 13 (L) 10/31/2021   NA 135 10/31/2021   K 3.9 10/31/2021   CL 104 10/31/2021   CREATININE 1.14 10/31/2021   BUN 20  10/31/2021   CO2 24 10/31/2021   TSH 1.28 01/08/2017   PSA 1.53 12/01/2008   INR 1.12 04/17/2018   HGBA1C 6.0 11/05/2013    Assessment/Plan:  Other secondary acute gout of left hand  -likely 2/2 recent increase in seafood, beer, mushrooms, and organ meats (souse) -pt to increase p.o. intake of water and fluids -We will avoid prednisone given patient's current chemo with Revlimid this may cause increased risk of infection. -Discussed starting colchicine however current use of flecainide may cause elevated  colchicine levels. -Given handouts.  Discussed foods to avoid. - Plan: colchicine 0.6 MG tablet  Finger swelling -Likely 2/2 gout -Continue supportive care as above  Multiple myeloma, remission status unspecified (HCC)  -Stable -Continue Revlimid -Continue follow-up with oncology - Plan: For home use only DME Hospital bed  F/u as needed for continued or worsening symptoms.  [name], MD

## 2021-11-21 NOTE — Patient Instructions (Signed)
A prescription for colchicine was sent to your pharmacy.  This medication help reduce uric acid levels in your blood.  You are to take 2 pills when you receive the medicine.  Then take another pill 1 hour later.  Then start taking them daily until symptoms improve.

## 2021-11-23 ENCOUNTER — Telehealth: Payer: Self-pay | Admitting: Family Medicine

## 2021-11-23 NOTE — Telephone Encounter (Signed)
Jonathon [name] with [facility] [phone]) called to say that she just faxed a request for an Office note dated 11/21/2021 to be amended.    I checked the eFax folder and it was not there. I also confirmed the fax number she used.  Jonathon [name] stated the fax will say exactly the  correction needed.  Please be on the look out.

## 2021-11-26 NOTE — Progress Notes (Signed)
Patient requires re-positioning of the body in ways that cannot be achieved with an ordinary bed or wedge pillow, to eliminate pain, reduce pressure. Patient has multiple myeloma. Patient needs assistance getting in & out of bed.

## 2021-11-26 NOTE — Telephone Encounter (Signed)
Note fixed and faxed.

## 2021-11-27 ENCOUNTER — Other Ambulatory Visit: Payer: Self-pay

## 2021-11-27 DIAGNOSIS — C9001 Multiple myeloma in remission: Secondary | ICD-10-CM

## 2021-11-27 MED FILL — Dexamethasone Sodium Phosphate Inj 100 MG/10ML: INTRAMUSCULAR | Qty: 2 | Status: AC

## 2021-11-28 ENCOUNTER — Inpatient Hospital Stay: Payer: Medicare Other

## 2021-11-28 ENCOUNTER — Inpatient Hospital Stay: Payer: Medicare Other | Attending: Hematology

## 2021-11-28 ENCOUNTER — Other Ambulatory Visit: Payer: Self-pay

## 2021-11-28 VITALS — BP 120/61 | HR 60 | Temp 98.1°F | Resp 18 | Wt 206.8 lb

## 2021-11-28 DIAGNOSIS — C9001 Multiple myeloma in remission: Secondary | ICD-10-CM

## 2021-11-28 DIAGNOSIS — C7951 Secondary malignant neoplasm of bone: Secondary | ICD-10-CM | POA: Diagnosis not present

## 2021-11-28 DIAGNOSIS — Z7189 Other specified counseling: Secondary | ICD-10-CM

## 2021-11-28 DIAGNOSIS — C9002 Multiple myeloma in relapse: Secondary | ICD-10-CM | POA: Diagnosis not present

## 2021-11-28 DIAGNOSIS — Z5112 Encounter for antineoplastic immunotherapy: Secondary | ICD-10-CM | POA: Diagnosis not present

## 2021-11-28 LAB — CMP (CANCER CENTER ONLY)
ALT: 31 U/L (ref 0–44)
AST: 24 U/L (ref 15–41)
Albumin: 3.7 g/dL (ref 3.5–5.0)
Alkaline Phosphatase: 136 U/L — ABNORMAL HIGH (ref 38–126)
Anion gap: 6 (ref 5–15)
BUN: 16 mg/dL (ref 8–23)
CO2: 27 mmol/L (ref 22–32)
Calcium: 9.2 mg/dL (ref 8.9–10.3)
Chloride: 104 mmol/L (ref 98–111)
Creatinine: 1.09 mg/dL (ref 0.61–1.24)
GFR, Estimated: >60 mL/min (ref >60)
Glucose, Bld: 109 mg/dL — ABNORMAL HIGH (ref 70–99)
Potassium: 3.6 mmol/L (ref 3.5–5.1)
Sodium: 137 mmol/L (ref 135–145)
Total Bilirubin: 0.5 mg/dL (ref 0.3–1.2)
Total Protein: 6.4 g/dL — ABNORMAL LOW (ref 6.5–8.1)

## 2021-11-28 LAB — CBC WITH DIFFERENTIAL (CANCER CENTER ONLY)
Abs Immature Granulocytes: 0.02 10*3/uL (ref 0.00–0.07)
Basophils Absolute: 0.1 10*3/uL (ref 0.0–0.1)
Basophils Relative: 2 %
Eosinophils Absolute: 0.5 10*3/uL (ref 0.0–0.5)
Eosinophils Relative: 15 %
HCT: 32.8 % — ABNORMAL LOW (ref 39.0–52.0)
Hemoglobin: 11.1 g/dL — ABNORMAL LOW (ref 13.0–17.0)
Immature Granulocytes: 1 %
Lymphocytes Relative: 32 %
Lymphs Abs: 1 10*3/uL (ref 0.7–4.0)
MCH: 28.8 pg (ref 26.0–34.0)
MCHC: 33.8 g/dL (ref 30.0–36.0)
MCV: 85.2 fL (ref 80.0–100.0)
Monocytes Absolute: 0.4 10*3/uL (ref 0.1–1.0)
Monocytes Relative: 12 %
Neutro Abs: 1.2 10*3/uL — ABNORMAL LOW (ref 1.7–7.7)
Neutrophils Relative %: 38 %
Platelet Count: 107 10*3/uL — ABNORMAL LOW (ref 150–400)
RBC: 3.85 MIL/uL — ABNORMAL LOW (ref 4.22–5.81)
RDW: 14.7 % (ref 11.5–15.5)
WBC Count: 3.1 10*3/uL — ABNORMAL LOW (ref 4.0–10.5)
nRBC: 0 % (ref 0.0–0.2)

## 2021-11-28 MED ORDER — FAMOTIDINE IN NACL 20-0.9 MG/50ML-% IV SOLN
20.0000 mg | Freq: Once | INTRAVENOUS | Status: AC
  Administered 2021-11-28: 20 mg via INTRAVENOUS
  Filled 2021-11-28: qty 50

## 2021-11-28 MED ORDER — SODIUM CHLORIDE 0.9 % IV SOLN
16.0000 mg/kg | Freq: Once | INTRAVENOUS | Status: AC
  Administered 2021-11-28: 1600 mg via INTRAVENOUS
  Filled 2021-11-28: qty 80

## 2021-11-28 MED ORDER — ACETAMINOPHEN 325 MG PO TABS
650.0000 mg | ORAL_TABLET | Freq: Once | ORAL | Status: AC
  Administered 2021-11-28: 650 mg via ORAL
  Filled 2021-11-28: qty 2

## 2021-11-28 MED ORDER — MONTELUKAST SODIUM 10 MG PO TABS
10.0000 mg | ORAL_TABLET | Freq: Once | ORAL | Status: AC
  Administered 2021-11-28: 10 mg via ORAL
  Filled 2021-11-28: qty 1

## 2021-11-28 MED ORDER — DIPHENHYDRAMINE HCL 25 MG PO CAPS
50.0000 mg | ORAL_CAPSULE | Freq: Once | ORAL | Status: AC
  Administered 2021-11-28: 50 mg via ORAL
  Filled 2021-11-28: qty 2

## 2021-11-28 MED ORDER — SODIUM CHLORIDE 0.9 % IV SOLN: Freq: Once | INTRAVENOUS | Status: AC

## 2021-11-28 MED ORDER — SODIUM CHLORIDE 0.9 % IV SOLN
20.0000 mg | Freq: Once | INTRAVENOUS | Status: AC
  Administered 2021-11-28: 20 mg via INTRAVENOUS
  Filled 2021-11-28: qty 20

## 2021-11-28 NOTE — Patient Instructions (Signed)
[facility]  Discharge Instructions: Thank you for choosing [facility] to provide your oncology and hematology care.   If you have a lab appointment with the [facility], please go directly to the [facility] and check in at the registration area.   Wear comfortable clothing and clothing appropriate for easy access to any Portacath or PICC line.   We strive to give you quality time with your provider. You may need to reschedule your appointment if you arrive late (15 or more minutes).  Arriving late affects you and other patients whose appointments are after yours.  Also, if you miss three or more appointments without notifying the office, you may be dismissed from the clinic at the provider's discretion.      For prescription refill requests, have your pharmacy contact our office and allow 72 hours for refills to be completed.    Today you received the following chemotherapy and/or immunotherapy agents: Darzalex      To help prevent nausea and vomiting after your treatment, we encourage you to take your nausea medication as directed.  BELOW ARE SYMPTOMS THAT SHOULD BE REPORTED IMMEDIATELY: *FEVER GREATER THAN 100.4 F (38 C) OR HIGHER *CHILLS OR SWEATING *NAUSEA AND VOMITING THAT IS NOT CONTROLLED WITH YOUR NAUSEA MEDICATION *UNUSUAL SHORTNESS OF BREATH *UNUSUAL BRUISING OR BLEEDING *URINARY PROBLEMS (pain or burning when urinating, or frequent urination) *BOWEL PROBLEMS (unusual diarrhea, constipation, pain near the anus) TENDERNESS IN MOUTH AND THROAT WITH OR WITHOUT PRESENCE OF ULCERS (sore throat, sores in mouth, or a toothache) UNUSUAL RASH, SWELLING OR PAIN  UNUSUAL VAGINAL DISCHARGE OR ITCHING   Items with * indicate a potential emergency and should be followed up as soon as possible or go to the Emergency Department if any problems should occur.  Please show the CHEMOTHERAPY ALERT CARD or IMMUNOTHERAPY ALERT CARD at check-in to  the Emergency Department and triage nurse.  Should you have questions after your visit or need to cancel or reschedule your appointment, please contact [facility]  Dept: [phone]  and follow the prompts.  Office hours are 8:00 a.m. to 4:30 p.m. Monday - Friday. Please note that voicemails left after 4:00 p.m. may not be returned until the following business day.  We are closed weekends and major holidays. You have access to a nurse at all times for urgent questions. Please call the main number to the clinic Dept: [phone] and follow the prompts.   For any non-urgent questions, you may also contact your provider using MyChart. We now offer e-Visits for anyone 61 and older to request care online for non-urgent symptoms. For details visit [email or web address].   Also download the MyChart app! Go to the app store, search "MyChart", open the app, select [facility], and log in with your MyChart username and password.  Masks are optional in the cancer centers. If you would like for your care team to wear a mask while they are taking care of you, please let them know. For doctor visits, patients may have with them one support Chantilly Linskey who is at least 84 years old. At this time, visitors are not allowed in the infusion area.

## 2021-11-28 NOTE — Progress Notes (Signed)
Per Dr. [name], okay to treat with ANC 1.2

## 2021-11-30 ENCOUNTER — Other Ambulatory Visit: Payer: Self-pay | Admitting: Hematology

## 2021-11-30 DIAGNOSIS — C9000 Multiple myeloma not having achieved remission: Secondary | ICD-10-CM

## 2021-12-03 ENCOUNTER — Other Ambulatory Visit: Payer: Self-pay

## 2021-12-03 LAB — MULTIPLE MYELOMA PANEL, SERUM
Albumin SerPl Elph-Mcnc: 3 g/dL (ref 2.9–4.4)
Albumin/Glob SerPl: 1.1 (ref 0.7–1.7)
Alpha 1: 0.2 g/dL (ref 0.0–0.4)
Alpha2 Glob SerPl Elph-Mcnc: 0.9 g/dL (ref 0.4–1.0)
B-Globulin SerPl Elph-Mcnc: 1 g/dL (ref 0.7–1.3)
Gamma Glob SerPl Elph-Mcnc: 0.7 g/dL (ref 0.4–1.8)
Globulin, Total: 2.9 g/dL (ref 2.2–3.9)
IgA: 237 mg/dL (ref 61–437)
IgG (Immunoglobin G), Serum: 688 mg/dL (ref 603–1613)
IgM (Immunoglobulin M), Srm: 54 mg/dL (ref 15–143)
Total Protein ELP: 5.9 g/dL — ABNORMAL LOW (ref 6.0–8.5)

## 2021-12-11 ENCOUNTER — Other Ambulatory Visit: Payer: Self-pay

## 2021-12-21 ENCOUNTER — Other Ambulatory Visit: Payer: Self-pay | Admitting: *Deleted

## 2021-12-21 DIAGNOSIS — C9001 Multiple myeloma in remission: Secondary | ICD-10-CM

## 2021-12-25 ENCOUNTER — Other Ambulatory Visit: Payer: Self-pay | Admitting: Hematology

## 2021-12-25 ENCOUNTER — Inpatient Hospital Stay: Payer: Medicare Other

## 2021-12-25 ENCOUNTER — Other Ambulatory Visit: Payer: Self-pay

## 2021-12-25 ENCOUNTER — Inpatient Hospital Stay: Payer: Medicare Other | Attending: Hematology | Admitting: Hematology

## 2021-12-25 VITALS — BP 134/72 | HR 54 | Temp 98.1°F | Resp 16

## 2021-12-25 VITALS — BP 141/71 | HR 60 | Temp 98.1°F | Resp 17 | Ht 70.0 in | Wt 209.3 lb

## 2021-12-25 DIAGNOSIS — C9001 Multiple myeloma in remission: Secondary | ICD-10-CM

## 2021-12-25 DIAGNOSIS — C9002 Multiple myeloma in relapse: Secondary | ICD-10-CM | POA: Diagnosis not present

## 2021-12-25 DIAGNOSIS — Z5112 Encounter for antineoplastic immunotherapy: Secondary | ICD-10-CM | POA: Insufficient documentation

## 2021-12-25 DIAGNOSIS — C7951 Secondary malignant neoplasm of bone: Secondary | ICD-10-CM | POA: Diagnosis not present

## 2021-12-25 DIAGNOSIS — Z7189 Other specified counseling: Secondary | ICD-10-CM

## 2021-12-25 LAB — CMP (CANCER CENTER ONLY)
ALT: 17 U/L (ref 0–44)
AST: 19 U/L (ref 15–41)
Albumin: 3.7 g/dL (ref 3.5–5.0)
Alkaline Phosphatase: 146 U/L — ABNORMAL HIGH (ref 38–126)
Anion gap: 7 (ref 5–15)
BUN: 20 mg/dL (ref 8–23)
CO2: 24 mmol/L (ref 22–32)
Calcium: 9.5 mg/dL (ref 8.9–10.3)
Chloride: 108 mmol/L (ref 98–111)
Creatinine: 1.31 mg/dL — ABNORMAL HIGH (ref 0.61–1.24)
GFR, Estimated: 54 mL/min — ABNORMAL LOW (ref >60)
Glucose, Bld: 118 mg/dL — ABNORMAL HIGH (ref 70–99)
Potassium: 3.8 mmol/L (ref 3.5–5.1)
Sodium: 139 mmol/L (ref 135–145)
Total Bilirubin: 0.7 mg/dL (ref 0.3–1.2)
Total Protein: 6.5 g/dL (ref 6.5–8.1)

## 2021-12-25 LAB — CBC WITH DIFFERENTIAL (CANCER CENTER ONLY)
Abs Immature Granulocytes: 0.01 10*3/uL (ref 0.00–0.07)
Basophils Absolute: 0 10*3/uL (ref 0.0–0.1)
Basophils Relative: 1 %
Eosinophils Absolute: 0.3 10*3/uL (ref 0.0–0.5)
Eosinophils Relative: 10 %
HCT: 33.9 % — ABNORMAL LOW (ref 39.0–52.0)
Hemoglobin: 11.6 g/dL — ABNORMAL LOW (ref 13.0–17.0)
Immature Granulocytes: 0 %
Lymphocytes Relative: 42 %
Lymphs Abs: 1.3 10*3/uL (ref 0.7–4.0)
MCH: 29.1 pg (ref 26.0–34.0)
MCHC: 34.2 g/dL (ref 30.0–36.0)
MCV: 85 fL (ref 80.0–100.0)
Monocytes Absolute: 0.5 10*3/uL (ref 0.1–1.0)
Monocytes Relative: 16 %
Neutro Abs: 1 10*3/uL — ABNORMAL LOW (ref 1.7–7.7)
Neutrophils Relative %: 31 %
Platelet Count: 100 10*3/uL — ABNORMAL LOW (ref 150–400)
RBC: 3.99 MIL/uL — ABNORMAL LOW (ref 4.22–5.81)
RDW: 15.1 % (ref 11.5–15.5)
WBC Count: 3.1 10*3/uL — ABNORMAL LOW (ref 4.0–10.5)
nRBC: 0 % (ref 0.0–0.2)

## 2021-12-25 MED ORDER — DIPHENHYDRAMINE HCL 25 MG PO CAPS
ORAL_CAPSULE | ORAL | Status: AC
  Filled 2021-12-25: qty 2

## 2021-12-25 MED ORDER — FAMOTIDINE IN NACL 20-0.9 MG/50ML-% IV SOLN
20.0000 mg | Freq: Once | INTRAVENOUS | Status: AC
  Administered 2021-12-25: 20 mg via INTRAVENOUS

## 2021-12-25 MED ORDER — SODIUM CHLORIDE 0.9 % IV SOLN
20.0000 mg | Freq: Once | INTRAVENOUS | Status: AC
  Administered 2021-12-25: 20 mg via INTRAVENOUS
  Filled 2021-12-25: qty 20

## 2021-12-25 MED ORDER — MONTELUKAST SODIUM 10 MG PO TABS
10.0000 mg | ORAL_TABLET | Freq: Once | ORAL | Status: AC
  Administered 2021-12-25: 10 mg via ORAL

## 2021-12-25 MED ORDER — ACETAMINOPHEN 325 MG PO TABS
650.0000 mg | ORAL_TABLET | Freq: Once | ORAL | Status: AC
  Administered 2021-12-25: 650 mg via ORAL

## 2021-12-25 MED ORDER — SODIUM CHLORIDE 0.9 % IV SOLN: Freq: Once | INTRAVENOUS | Status: AC

## 2021-12-25 MED ORDER — ACETAMINOPHEN 325 MG PO TABS
ORAL_TABLET | ORAL | Status: AC
  Filled 2021-12-25: qty 2

## 2021-12-25 MED ORDER — DIPHENHYDRAMINE HCL 25 MG PO CAPS
50.0000 mg | ORAL_CAPSULE | Freq: Once | ORAL | Status: AC
  Administered 2021-12-25: 50 mg via ORAL

## 2021-12-25 MED ORDER — MONTELUKAST SODIUM 10 MG PO TABS
ORAL_TABLET | ORAL | Status: AC
  Filled 2021-12-25: qty 1

## 2021-12-25 MED ORDER — FAMOTIDINE IN NACL 20-0.9 MG/50ML-% IV SOLN
INTRAVENOUS | Status: AC
  Filled 2021-12-25: qty 50

## 2021-12-25 MED ORDER — SODIUM CHLORIDE 0.9 % IV SOLN
16.0000 mg/kg | Freq: Once | INTRAVENOUS | Status: AC
  Administered 2021-12-25: 1600 mg via INTRAVENOUS
  Filled 2021-12-25: qty 80

## 2021-12-25 NOTE — Progress Notes (Signed)
Per Dr. Irene Limbo OK to trt today w/ ANC 1.0 K/uL

## 2021-12-25 NOTE — Patient Instructions (Signed)
[facility]  Discharge Instructions: Thank you for choosing [facility] to provide your oncology and hematology care.   If you have a lab appointment with the Cancer Center, please go directly to the Cancer Center and check in at the registration area.   Wear comfortable clothing and clothing appropriate for easy access to any Portacath or PICC line.   We strive to give you quality time with your provider. You may need to reschedule your appointment if you arrive late (15 or more minutes).  Arriving late affects you and other patients whose appointments are after yours.  Also, if you miss three or more appointments without notifying the office, you may be dismissed from the clinic at the provider's discretion.      For prescription refill requests, have your pharmacy contact our office and allow 72 hours for refills to be completed.    Today you received the following chemotherapy and/or immunotherapy agents: Darzalex      To help prevent nausea and vomiting after your treatment, we encourage you to take your nausea medication as directed.  BELOW ARE SYMPTOMS THAT SHOULD BE REPORTED IMMEDIATELY: *FEVER GREATER THAN 100.4 F (38 C) OR HIGHER *CHILLS OR SWEATING *NAUSEA AND VOMITING THAT IS NOT CONTROLLED WITH YOUR NAUSEA MEDICATION *UNUSUAL SHORTNESS OF BREATH *UNUSUAL BRUISING OR BLEEDING *URINARY PROBLEMS (pain or burning when urinating, or frequent urination) *BOWEL PROBLEMS (unusual diarrhea, constipation, pain near the anus) TENDERNESS IN MOUTH AND THROAT WITH OR WITHOUT PRESENCE OF ULCERS (sore throat, sores in mouth, or a toothache) UNUSUAL RASH, SWELLING OR PAIN  UNUSUAL VAGINAL DISCHARGE OR ITCHING   Items with * indicate a potential emergency and should be followed up as soon as possible or go to the Emergency Department if any problems should occur.  Please show the CHEMOTHERAPY ALERT CARD or IMMUNOTHERAPY ALERT CARD at check-in to  the Emergency Department and triage nurse.  Should you have questions after your visit or need to cancel or reschedule your appointment, please contact [facility]  Dept: [phone]  and follow the prompts.  Office hours are 8:00 a.m. to 4:30 p.m. Monday - Friday. Please note that voicemails left after 4:00 p.m. may not be returned until the following business day.  We are closed weekends and major holidays. You have access to a nurse at all times for urgent questions. Please call the main number to the clinic Dept: [phone] and follow the prompts.   For any non-urgent questions, you may also contact your provider using MyChart. We now offer e-Visits for anyone 18 and older to request care online for non-urgent symptoms. For details visit [email or web address].   Also download the MyChart app! Go to the app store, search "MyChart", open the app, select , and log in with your MyChart username and password.  Masks are optional in the cancer centers. If you would like for your care team to wear a mask while they are taking care of you, please let them know. You may have one support person who is at least 84 years old accompany you for your appointments. 

## 2021-12-26 ENCOUNTER — Other Ambulatory Visit: Payer: Self-pay | Admitting: Hematology

## 2021-12-26 DIAGNOSIS — C9000 Multiple myeloma not having achieved remission: Secondary | ICD-10-CM

## 2021-12-28 LAB — MULTIPLE MYELOMA PANEL, SERUM
Albumin SerPl Elph-Mcnc: 3.4 g/dL (ref 2.9–4.4)
Albumin/Glob SerPl: 1.4 (ref 0.7–1.7)
Alpha 1: 0.2 g/dL (ref 0.0–0.4)
Alpha2 Glob SerPl Elph-Mcnc: 0.8 g/dL (ref 0.4–1.0)
B-Globulin SerPl Elph-Mcnc: 0.9 g/dL (ref 0.7–1.3)
Gamma Glob SerPl Elph-Mcnc: 0.6 g/dL (ref 0.4–1.8)
Globulin, Total: 2.5 g/dL (ref 2.2–3.9)
IgA: 188 mg/dL (ref 61–437)
IgG (Immunoglobin G), Serum: 638 mg/dL (ref 603–1613)
IgM (Immunoglobulin M), Srm: 63 mg/dL (ref 15–143)
Total Protein ELP: 5.9 g/dL — ABNORMAL LOW (ref 6.0–8.5)

## 2021-12-31 DIAGNOSIS — B351 Tinea unguium: Secondary | ICD-10-CM | POA: Diagnosis not present

## 2021-12-31 DIAGNOSIS — M79675 Pain in left toe(s): Secondary | ICD-10-CM | POA: Diagnosis not present

## 2021-12-31 DIAGNOSIS — M79674 Pain in right toe(s): Secondary | ICD-10-CM | POA: Diagnosis not present

## 2022-01-01 ENCOUNTER — Encounter: Payer: Self-pay | Admitting: Hematology

## 2022-01-01 NOTE — Progress Notes (Signed)
HEMATOLOGY/ONCOLOGY CLINIC NOTE  Date of Service:.12/25/2021   Patient Care Team: [name], MD as PCP - General (Family Medicine) [name], MD (Cardiology) [name], MD (Cardiology) [name], DO as Consulting Physician (Neurology)  CHIEF COMPLAINTS/PURPOSE OF CONSULTATION:  Follow-up for continued evaluation and management of multiple myeloma  HISTORY OF PRESENTING ILLNESS:  Please see previous note for details on initial presentation.  INTERVAL HISTORY:  Jonathon [name] is here for continued evaluation and management of multiple myeloma. He notes no acute new symptoms since his last clinic visit.. No new treatment related toxicities reported. No focal bone pains no fevers no chills no night sweats no unexpected weight loss. Labs done today were discussed in detail with the patient.  MEDICAL HISTORY:  Past Medical History:  Diagnosis Date   Asthma    BENIGN PROSTATIC HYPERTROPHY, HX OF 10/25/2008   Bone metastases 04/14/2018   BRADYCARDIA 2005   Chronic diastolic congestive heart failure ([place]) 06/05/2016   CIDP (chronic inflammatory demyelinating polyneuropathy) ([place])    HYPERTENSION 11/21/2006   Iron deficiency anemia, unspecified 04/19/2013   Multiple myeloma ([place]) 04/29/2018   NEPHROLITHIASIS, HX OF 11/21/2006 and 10/15/2008   PACEMAKER, PERMANENT 2005   Gen change 2014 Medtronic Adaptic L dual-chamber pacemaker, serial #[id number]    SVT (supraventricular tachycardia) ([place])    TOBACCO ABUSE 10/25/2008   Quit 2012    SURGICAL HISTORY: Past Surgical History:  Procedure Laterality Date   COLONOSCOPY  2004,2009   PACEMAKER INSERTION  05/14/2003   PERMANENT PACEMAKER GENERATOR CHANGE N/A 06/19/2012   Procedure: PERMANENT PACEMAKER GENERATOR CHANGE;  Surgeon: [name], MD; Medtronic Adaptic L dual-chamber pacemaker, serial #[id number]     SKIN GRAFT Right 05/13/1960   wrist   TOOTH EXTRACTION  11/10/2020   3 teeth removed    SOCIAL  HISTORY: Social History   Socioeconomic History   Marital status: Divorced    Spouse name: Not on file   Number of children: 4   Years of education: Not on file   Highest education level: Not on file  Occupational History   Occupation: ENVIROMENTAL SERVICE    Employer: [employer]   Occupation: retired  Tobacco Use   Smoking status: Former    Packs/day: 0.25    Years: 46.00    Total pack years: 11.50    Types: Cigarettes    Start date: 03/16/1965    Quit date: 06/15/2010    Years since quitting: 11.5   Smokeless tobacco: Never   Tobacco comments:    Former smoker 10/11/21 quit 3 years ago  Vaping Use   Vaping Use: Never used  Substance and Sexual Activity   Alcohol use: Yes    Alcohol/week: 6.0 - 10.0 standard drinks of alcohol    Types: 6 - 10 Glasses of wine per week    Comment: 1-2 glasses every weekend 10/11/21   Drug use: No   Sexual activity: Not on file  Other Topics Concern   Not on file  Social History Narrative   Has relocated from [place] in 2007. 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  Patient in a facility thru [facility]   Social Determinants of Health   Financial Resource Strain: Low Risk  (03/01/2021)   Overall Financial Resource Strain (CARDIA)    Difficulty of Paying Living Expenses: Not hard at all  Food Insecurity: No Food Insecurity (03/01/2021)   Hunger Vital Sign    Worried About Running Out of Food in the Last Year: Never  true    Ran Out of Food in the Last Year: Never true  Transportation Needs: No Transportation Needs (03/01/2021)   PRAPARE - Hydrologist (Medical): No    Lack of Transportation (Non-Medical): No  Physical Activity: Not on file  Stress: Not on file  Social Connections: Unknown (03/01/2021)   Social Connection and Isolation Panel [NHANES]    Frequency of Communication with Friends and Family: More than three times a week    Frequency of Social Gatherings with Friends and Family: More than three times a  week    Attends Religious Services: Not on Advertising copywriter or Organizations: Not on file    Attends Archivist Meetings: Not on file    Marital Status: Not on file  Intimate Partner Violence: Not on file    FAMILY HISTORY: Family History  Problem Relation Age of Onset   Heart attack Father        Died, 64   Kidney disease Father    Parkinson's disease Mother        Died, 18   Breast cancer Sister        Living, 5   Diabetes type II Brother        Living, 26   Breast cancer Sister        Living, 22   Diabetes Daughter        Living, 46   Diabetes Son        Living, 72   Ovarian cancer Daughter    Colon cancer Neg Hx    Rectal cancer Neg Hx    Stomach cancer Neg Hx     ALLERGIES:  is allergic to lexapro [escitalopram oxalate].  MEDICATIONS:  Current Outpatient Medications  Medication Sig Dispense Refill   acyclovir (ZOVIRAX) 400 MG tablet Take 1 tablet (400 mg total) by mouth 2 (two) times daily. 60 tablet 11   apixaban (ELIQUIS) 5 MG TABS tablet Take 1 tablet (5 mg total) by mouth 2 (two) times daily. 60 tablet 11   colchicine 0.6 MG tablet Take 2 tabs (1.2 mg) now.  Then take 1 tab (0.6 mg) 1 hour later.  Then take 1 tab daily until gout flare resolves. 14 tablet 0   diltiazem (TIAZAC) 240 MG 24 hr capsule Take 1 capsule by mouth daily.     flecainide (TAMBOCOR) 150 MG tablet Take 0.5 tablets (75 mg total) by mouth 2 (two) times daily. 90 tablet 3   furosemide (LASIX) 40 MG tablet Take 40 mg by mouth daily.     irbesartan (AVAPRO) 300 MG tablet Take 300 mg by mouth daily.     lenalidomide (REVLIMID) 5 MG capsule TAKE 1 CAPSULE BY MOUTH 1 TIME A DAY 21 capsule 0   metoprolol tartrate (LOPRESSOR) 50 MG tablet Take 1 tablet (50 mg total) by mouth 2 (two) times daily. 60 tablet 0   ondansetron (ZOFRAN) 8 MG tablet Take 1 tablet (8 mg total) by mouth 2 (two) times daily as needed (Nausea or vomiting). 30 tablet 1   polyethylene glycol (MIRALAX /  GLYCOLAX) packet Take 17 g by mouth daily. 14 each 0   prochlorperazine (COMPAZINE) 10 MG tablet Take 1 tablet (10 mg total) by mouth every 6 (six) hours as needed (Nausea or vomiting). 30 tablet 1   Saccharomyces boulardii (FLORASTOR PO) Take 250 mg by mouth 2 (two) times daily.     senna-docusate (SENOKOT-S) 8.6-50 MG tablet Take  2 tablets by mouth 2 (two) times daily as needed for mild constipation. 30 tablet 0   tamsulosin (FLOMAX) 0.4 MG CAPS capsule Take 2 capsules (0.8 mg total) by mouth daily. 90 capsule 0   traMADol (ULTRAM) 50 MG tablet Take 1 tablet (50 mg total) by mouth every 6 (six) hours as needed. 30 tablet 0   No current facility-administered medications for this visit.   Facility-Administered Medications Ordered in Other Visits  Medication Dose Route Frequency Provider Last Rate Last Admin   acetaminophen (TYLENOL) 325 MG tablet            diphenhydrAMINE (BENADRYL) 25 mg capsule            famotidine (PEPCID) 20-0.9 MG/50ML-% IVPB            montelukast (SINGULAIR) 10 MG tablet            [MAR Hold] sodium chloride flush (NS) 0.9 % injection 10 mL  10 mL Intracatheter PRN Brunetta Genera, MD        REVIEW OF SYSTEMS:   10 Point review of Systems was done is negative except as noted above.  PHYSICAL EXAMINATION: ECOG PERFORMANCE STATUS: 2 - Symptomatic, <50% confined to bed  .BP (!) 141/71 (BP Location: Left Arm, Patient Position: Sitting)   Pulse 60   Temp 98.1 F (36.7 C) (Temporal)   Resp 17   Ht _0  (1.778 m)   Wt 209 lb 4.8 oz (94.9 kg)   SpO2 100%   BMI 30.03 kg/m  NAD GENERAL:alert, in no acute distress and comfortable SKIN: no acute rashes, no significant lesions EYES: conjunctiva are pink and non-injected, sclera anicteric OROPHARYNX: MMM, no exudates, no oropharyngeal erythema or ulceration NECK: supple, no JVD LYMPH:  no palpable lymphadenopathy in the cervical, axillary or inguinal regions LUNGS: clear to auscultation b/l with normal  respiratory effort HEART: regular rate & rhythm ABDOMEN:  normoactive bowel sounds , non tender, not distended. Extremity: no pedal edema PSYCH: alert & oriented x 3 with fluent speech NEURO: no focal motor/sensory deficits  LABORATORY DATA:  I have reviewed the data as listed .    Latest Ref Rng & Units 12/25/2021    9:00 AM 11/28/2021   10:38 AM 10/31/2021   12:13 PM  CBC  WBC 4.0 - 10.5 K/uL 3.1  3.1  2.4   Hemoglobin 13.0 - 17.0 g/dL 11.6  11.1  11.9   Hematocrit 39.0 - 52.0 % 33.9  32.8  35.0   Platelets 150 - 400 K/uL 100  107  88        Latest Ref Rng & Units 12/25/2021    9:00 AM 11/28/2021   10:38 AM 10/31/2021   12:13 PM  CMP  Glucose 70 - 99 mg/dL 118  109  106   BUN 8 - 23 mg/dL _1 Creatinine 0.61 - 1.24 mg/dL 1.31  1.09  1.14   Sodium 135 - 145 mmol/L 139  137  135   Potassium 3.5 - 5.1 mmol/L 3.8  3.6  3.9   Chloride 98 - 111 mmol/L 108  104  104   CO2 22 - 32 mmol/L _2 Calcium 8.9 - 10.3 mg/dL 9.5  9.2  9.2   Total Protein 6.5 - 8.1 g/dL 6.5  6.4  6.5   Total Bilirubin 0.3 - 1.2 mg/dL 0.7  0.5  0.8   Alkaline Phos 38 - 126 U/L 146  136  113   AST 15 - 41 U/L _0 ALT 0 - 44 U/L _1 04/17/18 BM Bx:    RADIOGRAPHIC STUDIES: I have personally reviewed the radiological images as listed and agreed with the findings in the report. No results found.  ASSESSMENT & PLAN:   86 y.o. male with  1.  Relapsed multiple myeloma multiple bone metastases   labs upon initial presentation from 04/14/18; hypercalcemic with Calcium at 13.8, renal function up form baseline with Creatinine at 1.48, HGB at 11.3. PSA was normal.   03/31/18 CT C/A/P revealed 1. Lytic lesion throughout all visualized vertebra from the lower cervical spine through the sacrum. Epidural extension of tumor T2, T3 and L2 level. Right L3-4 foraminal extension tumor. 2. Lytic lesions involving majority of ribs. Multiple pathologic rib fractures with bony  expansion/sub pleural extension of tumor at several levels. 3. Lytic lesions of the scapula, hips bilaterally and pelvis bilaterally. With further progression of tumor, patient may be at risk for pathologic fractures. Pathologic fracture right clavicle incompletely assessed. 4. Circumferential narrowing sigmoid colon. Question possibility of underlying mass (series 2, image 97). Alternatively this could represent muscular hypertrophy/peristalsis. 5. Gastric antrum circumferential narrowing. Cannot exclude mass although this may be related to peristalsis. 6. No primary lung malignancy noted. 7. Matted aortic pulmonary window lymph nodes. 8. Circumferential urinary bladder wall thickening greater anterior dome region. Right lateral bladder diverticulum. 9. 5 mm low-density lesion pancreatic body (series 5, image 10) unchanged. This is felt unlikely to be a primary malignancy. 10. Right parapelvic 2.2 cm cyst without significant change. Scattered low-density renal lesions bilaterally too small to adequately characterize. Some were present previously. Statistically these are likely cysts. 11. Stable adrenal gland hyperplasia. 12. Gynecomastia. 13. Prostate gland causes slight impression upon the bladder base. 14.  Aortic Atherosclerosis. 15. Sequential pacemaker in place. 16. Gallstones.   04/23/18 PET/CT revealed Innumerable hypermetabolic lytic lesions throughout the skeleton especially involving the spine, ribs, bony pelvis along with some involvement of both proximal femurs. The appearance is compatible with pathologic diagnosis of plasma cell neoplasm could well reflect multiple myeloma. Resulting pathologic fractures of the right lateral clavicle and of multiple bilateral ribs. Lesions are present along the cortical margins of the spinal canal. 2. Both the area and the stomach in the area in the sigmoid colon drawn attention to on prior CT scan appear benign normal today. 3. The confluence of AP window lymph  nodes measures about 1.2 cm in short axis with maximum SUV 3.1, which is mildly above the blood pool merit surveillance. 4. Other imaging findings of potential clinical significance: Aortic Atherosclerosis. Coronary atherosclerosis. Pacemaker noted. Borderline prostatomegaly. Cholelithiasis.  04/21/18 BM Bx revealed Normocellular bone marrow with Plasma Cell neoplasm. Scattered medium sized clusters of kappa-restricted plasma cells (14% aspirate, 10% CD138 immunohistochemistry.   I do suspect that the burden of disease is underestimated in the bone marrow sample given PET/CT findings of innumerable lytic lesions and an M spike of 3.4g   06/08/18 DG Hips bilat which revealed Innumerable lucencies are noted throughout the pelvis and proximal femurs, similar findings noted on prior PET-CT of 04/23/2018. Findings are consistent patient's known multiple myeloma. Femoral necks are intact. 2.  Aortoiliac atherosclerotic vascular disease.  08/06/2021 PET/CT revealed "1. Interval development of consolidative masslike opacities in both upper lobes demonstrating marked hypermetabolism. While these may be infectious/inflammatory, FDG accumulation is higher than typically seen  in that setting. Metastatic disease not excluded. 2. Dominant left rib, right scapula, and left sacral lesions all show marked interval decrease in size and hypermetabolism. 3. Soft tissue nodule in the midline back adjacent to the L4 spinous process is similar in size and hypermetabolism today. 4. Interval decrease in size and hypermetabolism in the gastrohepatic lymph node identified previously. 5. Multiple scattered osseous lesions compatible with treated multiple myeloma. 6. Cholelithiasis."   2.  Pulmonary infiltrates likely related to radiation related change. CT chest was discussed in detail with the patient PLAN:    Labs done today discussed in detail with the patient Myeloma panel shows no M spike.  IFE negative. CBC  stable with mild leukopenia mild thrombocytopenia and hemoglobin of 11.6 CMP stable Patient has no acute new symptoms suggestive of myeloma progression at this time Patient notes no notable toxicities from his current treatment at this time. Continue Revlimid 5 mg 3 weeks on 1 week off with VT prophylaxis Continue monthly daratumumab  Follow-up Please schedule next 2 cycles of every 4-weekly daratumumab with labs and MD visits  The total time spent in the appointment was 30 minutes*.  All of the patient's questions were answered with apparent satisfaction. The patient knows to call the clinic with any problems, questions or concerns.   Sullivan Lone MD MS AAHIVMS [facility] Hematology/Oncology Physician [facility]  .*Total Encounter Time as defined by the Centers for Medicare and Medicaid Services includes, in addition to the face-to-face time of a patient visit (documented in the note above) non-face-to-face time: obtaining and reviewing outside history, ordering and reviewing medications, tests or procedures, care coordination (communications with other health care professionals or caregivers) and documentation in the medical record.

## 2022-01-02 ENCOUNTER — Ambulatory Visit (INDEPENDENT_AMBULATORY_CARE_PROVIDER_SITE_OTHER): Payer: Medicare Other

## 2022-01-02 DIAGNOSIS — I495 Sick sinus syndrome: Secondary | ICD-10-CM

## 2022-01-03 LAB — CUP PACEART REMOTE DEVICE CHECK
Battery Impedance: 1851 Ohm
Battery Remaining Longevity: 36 mo
Battery Voltage: 2.73 V
Brady Statistic AP VP Percent: 62 %
Brady Statistic AP VS Percent: 0 %
Brady Statistic AS VP Percent: 38 %
Brady Statistic AS VS Percent: 0 %
Date Time Interrogation Session: 20230823053621
Implantable Lead Implant Date: 20050121
Implantable Lead Implant Date: 20050121
Implantable Lead Location: 753859
Implantable Lead Location: 753860
Implantable Lead Model: 5076
Implantable Lead Model: 5076
Implantable Pulse Generator Implant Date: 20140207
Lead Channel Impedance Value: 500 Ohm
Lead Channel Impedance Value: 582 Ohm
Lead Channel Pacing Threshold Amplitude: 0.625 V
Lead Channel Pacing Threshold Amplitude: 1.125 V
Lead Channel Pacing Threshold Pulse Width: 0.4 ms
Lead Channel Pacing Threshold Pulse Width: 0.4 ms
Lead Channel Setting Pacing Amplitude: 2.25 V
Lead Channel Setting Pacing Amplitude: 2.5 V
Lead Channel Setting Pacing Pulse Width: 0.4 ms
Lead Channel Setting Sensing Sensitivity: 2 mV

## 2022-01-18 ENCOUNTER — Telehealth: Payer: Self-pay | Admitting: Family Medicine

## 2022-01-18 NOTE — Telephone Encounter (Signed)
Pt having issues with constipation, requesting something to assist, the miralax he is taking is not working.

## 2022-01-21 ENCOUNTER — Other Ambulatory Visit: Payer: Self-pay

## 2022-01-21 DIAGNOSIS — C9001 Multiple myeloma in remission: Secondary | ICD-10-CM

## 2022-01-22 ENCOUNTER — Other Ambulatory Visit (HOSPITAL_COMMUNITY): Payer: Self-pay

## 2022-01-22 ENCOUNTER — Other Ambulatory Visit: Payer: Self-pay | Admitting: Hematology

## 2022-01-22 ENCOUNTER — Telehealth: Payer: Self-pay | Admitting: Pharmacy Technician

## 2022-01-22 DIAGNOSIS — C9000 Multiple myeloma not having achieved remission: Secondary | ICD-10-CM

## 2022-01-22 MED FILL — Dexamethasone Sodium Phosphate Inj 100 MG/10ML: INTRAMUSCULAR | Qty: 2 | Status: AC

## 2022-01-22 NOTE — Telephone Encounter (Signed)
Oral Oncology Patient Advocate Encounter   Received notification that prior authorization for Lenalidomide is due for renewal.   PA submitted on 01/22/2022 Key [id number]  Status is pending     Jonathon [name], [name] [name] Direct Number: [phone]  Fax: [phone]

## 2022-01-23 ENCOUNTER — Inpatient Hospital Stay (HOSPITAL_BASED_OUTPATIENT_CLINIC_OR_DEPARTMENT_OTHER): Payer: Medicare Other | Admitting: Hematology

## 2022-01-23 ENCOUNTER — Other Ambulatory Visit (HOSPITAL_COMMUNITY): Payer: Self-pay

## 2022-01-23 ENCOUNTER — Inpatient Hospital Stay: Payer: Medicare Other

## 2022-01-23 ENCOUNTER — Other Ambulatory Visit: Payer: Self-pay

## 2022-01-23 ENCOUNTER — Inpatient Hospital Stay: Payer: Medicare Other | Attending: Hematology

## 2022-01-23 VITALS — BP 137/75 | HR 60 | Temp 97.7°F | Resp 17 | Wt 211.2 lb

## 2022-01-23 DIAGNOSIS — C9002 Multiple myeloma in relapse: Secondary | ICD-10-CM | POA: Insufficient documentation

## 2022-01-23 DIAGNOSIS — D696 Thrombocytopenia, unspecified: Secondary | ICD-10-CM | POA: Diagnosis not present

## 2022-01-23 DIAGNOSIS — Z5112 Encounter for antineoplastic immunotherapy: Secondary | ICD-10-CM | POA: Insufficient documentation

## 2022-01-23 DIAGNOSIS — D72819 Decreased white blood cell count, unspecified: Secondary | ICD-10-CM | POA: Insufficient documentation

## 2022-01-23 DIAGNOSIS — Z7189 Other specified counseling: Secondary | ICD-10-CM | POA: Diagnosis not present

## 2022-01-23 DIAGNOSIS — C7951 Secondary malignant neoplasm of bone: Secondary | ICD-10-CM | POA: Insufficient documentation

## 2022-01-23 DIAGNOSIS — C9001 Multiple myeloma in remission: Secondary | ICD-10-CM | POA: Diagnosis not present

## 2022-01-23 LAB — CBC WITH DIFFERENTIAL (CANCER CENTER ONLY)
Abs Immature Granulocytes: 0 10*3/uL (ref 0.00–0.07)
Basophils Absolute: 0.1 10*3/uL (ref 0.0–0.1)
Basophils Relative: 2 %
Eosinophils Absolute: 0.2 10*3/uL (ref 0.0–0.5)
Eosinophils Relative: 6 %
HCT: 34.6 % — ABNORMAL LOW (ref 39.0–52.0)
Hemoglobin: 11.7 g/dL — ABNORMAL LOW (ref 13.0–17.0)
Immature Granulocytes: 0 %
Lymphocytes Relative: 43 %
Lymphs Abs: 1.5 10*3/uL (ref 0.7–4.0)
MCH: 29.6 pg (ref 26.0–34.0)
MCHC: 33.8 g/dL (ref 30.0–36.0)
MCV: 87.6 fL (ref 80.0–100.0)
Monocytes Absolute: 0.4 10*3/uL (ref 0.1–1.0)
Monocytes Relative: 12 %
Neutro Abs: 1.3 10*3/uL — ABNORMAL LOW (ref 1.7–7.7)
Neutrophils Relative %: 37 %
Platelet Count: 124 10*3/uL — ABNORMAL LOW (ref 150–400)
RBC: 3.95 MIL/uL — ABNORMAL LOW (ref 4.22–5.81)
RDW: 15.5 % (ref 11.5–15.5)
WBC Count: 3.4 10*3/uL — ABNORMAL LOW (ref 4.0–10.5)
nRBC: 0 % (ref 0.0–0.2)

## 2022-01-23 LAB — CMP (CANCER CENTER ONLY)
ALT: 15 U/L (ref 0–44)
AST: 18 U/L (ref 15–41)
Albumin: 3.9 g/dL (ref 3.5–5.0)
Alkaline Phosphatase: 143 U/L — ABNORMAL HIGH (ref 38–126)
Anion gap: 5 (ref 5–15)
BUN: 19 mg/dL (ref 8–23)
CO2: 28 mmol/L (ref 22–32)
Calcium: 9.4 mg/dL (ref 8.9–10.3)
Chloride: 105 mmol/L (ref 98–111)
Creatinine: 1.21 mg/dL (ref 0.61–1.24)
GFR, Estimated: 59 mL/min — ABNORMAL LOW (ref >60)
Glucose, Bld: 119 mg/dL — ABNORMAL HIGH (ref 70–99)
Potassium: 3.9 mmol/L (ref 3.5–5.1)
Sodium: 138 mmol/L (ref 135–145)
Total Bilirubin: 0.5 mg/dL (ref 0.3–1.2)
Total Protein: 6.3 g/dL — ABNORMAL LOW (ref 6.5–8.1)

## 2022-01-23 MED ORDER — DIPHENHYDRAMINE HCL 25 MG PO CAPS
50.0000 mg | ORAL_CAPSULE | Freq: Once | ORAL | Status: AC
  Administered 2022-01-23: 50 mg via ORAL
  Filled 2022-01-23: qty 2

## 2022-01-23 MED ORDER — MONTELUKAST SODIUM 10 MG PO TABS
10.0000 mg | ORAL_TABLET | Freq: Once | ORAL | Status: AC
  Administered 2022-01-23: 10 mg via ORAL
  Filled 2022-01-23: qty 1

## 2022-01-23 MED ORDER — SODIUM CHLORIDE 0.9 % IV SOLN
16.0000 mg/kg | Freq: Once | INTRAVENOUS | Status: AC
  Administered 2022-01-23: 1600 mg via INTRAVENOUS
  Filled 2022-01-23: qty 80

## 2022-01-23 MED ORDER — ACETAMINOPHEN 325 MG PO TABS
650.0000 mg | ORAL_TABLET | Freq: Once | ORAL | Status: AC
  Administered 2022-01-23: 650 mg via ORAL
  Filled 2022-01-23: qty 2

## 2022-01-23 MED ORDER — FAMOTIDINE IN NACL 20-0.9 MG/50ML-% IV SOLN
20.0000 mg | Freq: Once | INTRAVENOUS | Status: AC
  Administered 2022-01-23: 20 mg via INTRAVENOUS
  Filled 2022-01-23: qty 50

## 2022-01-23 MED ORDER — SODIUM CHLORIDE 0.9 % IV SOLN
20.0000 mg | Freq: Once | INTRAVENOUS | Status: AC
  Administered 2022-01-23: 20 mg via INTRAVENOUS
  Filled 2022-01-23: qty 20

## 2022-01-23 MED ORDER — SODIUM CHLORIDE 0.9 % IV SOLN: Freq: Once | INTRAVENOUS | Status: AC

## 2022-01-23 NOTE — Telephone Encounter (Signed)
Can take over the counter colace and senna.

## 2022-01-23 NOTE — Telephone Encounter (Signed)
Oral Oncology Patient Advocate Encounter  Prior Authorization for Lenalidomide has been approved.    PA# [id number] Effective dates: 01/22/2022 through 01/23/2023  Patients co-pay is $0.    Lady Deutscher, CPhT-Adv Oncology Pharmacy Patient [place] Direct Number: [phone]  Fax: [phone]

## 2022-01-23 NOTE — Progress Notes (Signed)
Per Dr. Irene Limbo, okay to tx with ANC of 1.3 today.

## 2022-01-23 NOTE — Patient Instructions (Signed)
[facility] MEDICAL ONCOLOGY  Discharge Instructions: Thank you for choosing [facility] to provide your oncology and hematology care.   If you have a lab appointment with the Cancer Center, please go directly to the Cancer Center and check in at the registration area.   Wear comfortable clothing and clothing appropriate for easy access to any Portacath or PICC line.   We strive to give you quality time with your provider. You may need to reschedule your appointment if you arrive late (15 or more minutes).  Arriving late affects you and other patients whose appointments are after yours.  Also, if you miss three or more appointments without notifying the office, you may be dismissed from the clinic at the provider's discretion.      For prescription refill requests, have your pharmacy contact our office and allow 72 hours for refills to be completed.    Today you received the following chemotherapy and/or immunotherapy agents: daratumumab      To help prevent nausea and vomiting after your treatment, we encourage you to take your nausea medication as directed.  BELOW ARE SYMPTOMS THAT SHOULD BE REPORTED IMMEDIATELY: *FEVER GREATER THAN 100.4 F (38 C) OR HIGHER *CHILLS OR SWEATING *NAUSEA AND VOMITING THAT IS NOT CONTROLLED WITH YOUR NAUSEA MEDICATION *UNUSUAL SHORTNESS OF BREATH *UNUSUAL BRUISING OR BLEEDING *URINARY PROBLEMS (pain or burning when urinating, or frequent urination) *BOWEL PROBLEMS (unusual diarrhea, constipation, pain near the anus) TENDERNESS IN MOUTH AND THROAT WITH OR WITHOUT PRESENCE OF ULCERS (sore throat, sores in mouth, or a toothache) UNUSUAL RASH, SWELLING OR PAIN  UNUSUAL VAGINAL DISCHARGE OR ITCHING   Items with * indicate a potential emergency and should be followed up as soon as possible or go to the Emergency Department if any problems should occur.  Please show the CHEMOTHERAPY ALERT CARD or IMMUNOTHERAPY ALERT CARD at check-in  to the Emergency Department and triage nurse.  Should you have questions after your visit or need to cancel or reschedule your appointment, please contact [facility] MEDICAL ONCOLOGY  Dept: [phone]  and follow the prompts.  Office hours are 8:00 a.m. to 4:30 p.m. Monday - Friday. Please note that voicemails left after 4:00 p.m. may not be returned until the following business day.  We are closed weekends and major holidays. You have access to a nurse at all times for urgent questions. Please call the main number to the clinic Dept: [phone] and follow the prompts.   For any non-urgent questions, you may also contact your provider using MyChart. We now offer e-Visits for anyone 18 and older to request care online for non-urgent symptoms. For details visit [email or web address].   Also download the MyChart app! Go to the app store, search "MyChart", open the app, select [facility], and log in with your MyChart username and password.  Masks are optional in the cancer centers. If you would like for your care team to wear a mask while they are taking care of you, please let them know. You may have one support person who is at least 84 years old accompany you for your appointments. 

## 2022-01-28 NOTE — Progress Notes (Signed)
HEMATOLOGY/ONCOLOGY CLINIC NOTE  Date of Service: 01/23/2022   Patient Care Team: [name], MD as PCP - General (Family Medicine) [name], MD (Cardiology) [name], MD (Cardiology) [name], DO as Consulting Physician (Neurology)  CHIEF COMPLAINTS/PURPOSE OF CONSULTATION:  Follow-up for continued evaluation and management of multiple myeloma  HISTORY OF PRESENTING ILLNESS:  Please see previous note for details on initial presentation.  INTERVAL HISTORY:  Jonathon [name] is a 86 y.o. male here for continued evaluation and management of multiple myeloma. He reports He is doing well with no new symptoms or concerns.  No new treatment related toxicities reported.  No fever, chills, night sweats. No new focal bone pains. No new or unexpected weight loss. No other new or acute focal symptoms.  Labs done today were reviewed in detail.  MEDICAL HISTORY:  Past Medical History:  Diagnosis Date   Asthma    BENIGN PROSTATIC HYPERTROPHY, HX OF 10/25/2008   Bone metastases 04/14/2018   BRADYCARDIA 2005   Chronic diastolic congestive heart failure ([place]) 06/05/2016   CIDP (chronic inflammatory demyelinating polyneuropathy) ([place])    HYPERTENSION 11/21/2006   Iron deficiency anemia, unspecified 04/19/2013   Multiple myeloma ([place]) 04/29/2018   NEPHROLITHIASIS, HX OF 11/21/2006 and 10/15/2008   PACEMAKER, PERMANENT 2005   Gen change 2014 Medtronic Adaptic L dual-chamber pacemaker, serial #[id number]    SVT (supraventricular tachycardia) ([place])    TOBACCO ABUSE 10/25/2008   Quit 2012    SURGICAL HISTORY: Past Surgical History:  Procedure Laterality Date   COLONOSCOPY  2004,2009   PACEMAKER INSERTION  05/14/2003   PERMANENT PACEMAKER GENERATOR CHANGE N/A 06/19/2012   Procedure: PERMANENT PACEMAKER GENERATOR CHANGE;  Surgeon: [name], MD; Medtronic Adaptic L dual-chamber pacemaker, serial #[id number]     SKIN GRAFT Right 05/13/1960   wrist   TOOTH  EXTRACTION  11/10/2020   3 teeth removed    SOCIAL HISTORY: Social History   Socioeconomic History   Marital status: Divorced    Spouse name: Not on file   Number of children: 4   Years of education: Not on file   Highest education level: Not on file  Occupational History   Occupation: ENVIROMENTAL SERVICE    Employer: [employer]   Occupation: retired  Tobacco Use   Smoking status: Former    Packs/day: 0.25    Years: 46.00    Total pack years: 11.50    Types: Cigarettes    Start date: 03/16/1965    Quit date: 06/15/2010    Years since quitting: 11.6   Smokeless tobacco: Never   Tobacco comments:    Former smoker 10/11/21 quit 3 years ago  Vaping Use   Vaping Use: Never used  Substance and Sexual Activity   Alcohol use: Yes    Alcohol/week: 6.0 - 10.0 standard drinks of alcohol    Types: 6 - 10 Glasses of wine per week    Comment: 1-2 glasses every weekend 10/11/21   Drug use: No   Sexual activity: Not on file  Other Topics Concern   Not on file  Social History Narrative   Has relocated from [place] in 2007. 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  Patient in a facility thru [facility]   Social Determinants of Health   Financial Resource Strain: Low Risk  (03/01/2021)   Overall Financial Resource Strain (CARDIA)    Difficulty of Paying Living Expenses: Not hard at all  Food Insecurity: No Food Insecurity (03/01/2021)   Hunger Vital Sign  Worried About Charity fundraiser in the Last Year: Never true    [place] in the Last Year: Never true  Transportation Needs: No Transportation Needs (03/01/2021)   PRAPARE - Hydrologist (Medical): No    Lack of Transportation (Non-Medical): No  Physical Activity: Not on file  Stress: Not on file  Social Connections: Unknown (03/01/2021)   Social Connection and Isolation Panel [NHANES]    Frequency of Communication with Friends and Family: More than three times a week    Frequency of Social  Gatherings with Friends and Family: More than three times a week    Attends Religious Services: Not on Advertising copywriter or Organizations: Not on file    Attends Archivist Meetings: Not on file    Marital Status: Not on file  Intimate Partner Violence: Not on file    FAMILY HISTORY: Family History  Problem Relation Age of Onset   Heart attack Father        Died, 58   Kidney disease Father    Parkinson's disease Mother        Died, 27   Breast cancer Sister        Living, 10   Diabetes type II Brother        Living, 35   Breast cancer Sister        Living, 69   Diabetes Daughter        Living, 48   Diabetes Son        Living, 43   Ovarian cancer Daughter    Colon cancer Neg Hx    Rectal cancer Neg Hx    Stomach cancer Neg Hx     ALLERGIES:  is allergic to lexapro [escitalopram oxalate].  MEDICATIONS:  Current Outpatient Medications  Medication Sig Dispense Refill   acyclovir (ZOVIRAX) 400 MG tablet Take 1 tablet (400 mg total) by mouth 2 (two) times daily. 60 tablet 11   apixaban (ELIQUIS) 5 MG TABS tablet Take 1 tablet (5 mg total) by mouth 2 (two) times daily. 60 tablet 11   colchicine 0.6 MG tablet Take 2 tabs (1.2 mg) now.  Then take 1 tab (0.6 mg) 1 hour later.  Then take 1 tab daily until gout flare resolves. 14 tablet 0   diltiazem (TIAZAC) 240 MG 24 hr capsule Take 1 capsule by mouth daily.     flecainide (TAMBOCOR) 150 MG tablet Take 0.5 tablets (75 mg total) by mouth 2 (two) times daily. 90 tablet 3   furosemide (LASIX) 40 MG tablet Take 40 mg by mouth daily.     irbesartan (AVAPRO) 300 MG tablet Take 300 mg by mouth daily.     lenalidomide (REVLIMID) 5 MG capsule TAKE 1 CAPSULE BY MOUTH 1 TIME A DAY 21 capsule 0   metoprolol tartrate (LOPRESSOR) 50 MG tablet Take 1 tablet (50 mg total) by mouth 2 (two) times daily. 60 tablet 0   ondansetron (ZOFRAN) 8 MG tablet Take 1 tablet (8 mg total) by mouth 2 (two) times daily as needed (Nausea or  vomiting). 30 tablet 1   polyethylene glycol (MIRALAX / GLYCOLAX) packet Take 17 g by mouth daily. 14 each 0   prochlorperazine (COMPAZINE) 10 MG tablet Take 1 tablet (10 mg total) by mouth every 6 (six) hours as needed (Nausea or vomiting). 30 tablet 1   Saccharomyces boulardii (FLORASTOR PO) Take 250 mg by mouth 2 (two) times  daily.     senna-docusate (SENOKOT-S) 8.6-50 MG tablet Take 2 tablets by mouth 2 (two) times daily as needed for mild constipation. 30 tablet 0   tamsulosin (FLOMAX) 0.4 MG CAPS capsule Take 2 capsules (0.8 mg total) by mouth daily. 90 capsule 0   traMADol (ULTRAM) 50 MG tablet Take 1 tablet (50 mg total) by mouth every 6 (six) hours as needed. 30 tablet 0   No current facility-administered medications for this visit.   Facility-Administered Medications Ordered in Other Visits  Medication Dose Route Frequency Provider Last Rate Last Admin   [MAR Hold] sodium chloride flush (NS) 0.9 % injection 10 mL  10 mL Intracatheter PRN Brunetta Genera, MD        REVIEW OF SYSTEMS:   10 Point review of Systems was done is negative except as noted above.  PHYSICAL EXAMINATION: ECOG PERFORMANCE STATUS: 2 - Symptomatic, <50% confined to bed  .There were no vitals taken for this visit. NAD GENERAL:alert, in no acute distress and comfortable SKIN: no acute rashes, no significant lesions EYES: conjunctiva are pink and non-injected, sclera anicteric NECK: supple, no JVD LYMPH:  no palpable lymphadenopathy in the cervical, axillary or inguinal regions LUNGS: clear to auscultation b/l with normal respiratory effort HEART: regular rate & rhythm ABDOMEN:  normoactive bowel sounds , non tender, not distended. Extremity: no pedal edema PSYCH: alert & oriented x 3 with fluent speech NEURO: no focal motor/sensory deficits  LABORATORY DATA:  I have reviewed the data as listed .    Latest Ref Rng & Units 01/23/2022   10:11 AM 12/25/2021    9:00 AM 11/28/2021   10:38 AM  CBC   WBC 4.0 - 10.5 K/uL 3.4  3.1  3.1   Hemoglobin 13.0 - 17.0 g/dL 11.7  11.6  11.1   Hematocrit 39.0 - 52.0 % 34.6  33.9  32.8   Platelets 150 - 400 K/uL 124  100  107        Latest Ref Rng & Units 01/23/2022   10:11 AM 12/25/2021    9:00 AM 11/28/2021   10:38 AM  CMP  Glucose 70 - 99 mg/dL 119  118  109   BUN 8 - 23 mg/dL _0 Creatinine 0.61 - 1.24 mg/dL 1.21  1.31  1.09   Sodium 135 - 145 mmol/L 138  139  137   Potassium 3.5 - 5.1 mmol/L 3.9  3.8  3.6   Chloride 98 - 111 mmol/L 105  108  104   CO2 22 - 32 mmol/L _1 Calcium 8.9 - 10.3 mg/dL 9.4  9.5  9.2   Total Protein 6.5 - 8.1 g/dL 6.3  6.5  6.4   Total Bilirubin 0.3 - 1.2 mg/dL 0.5  0.7  0.5   Alkaline Phos 38 - 126 U/L 143  146  136   AST 15 - 41 U/L _2 ALT 0 - 44 U/L _3 04/17/18 BM Bx:    RADIOGRAPHIC STUDIES: I have personally reviewed the radiological images as listed and agreed with the findings in the report. CUP PACEART REMOTE DEVICE CHECK  Result Date: 01/03/2022 Scheduled remote reviewed. Normal device function.  Next remote 91 days. Kathy Breach, RN, CCDS, [employer]   ASSESSMENT & PLAN:   86 y.o. male with  1.  Relapsed multiple myeloma multiple bone  metastases   labs upon initial presentation from 04/14/18; hypercalcemic with Calcium at 13.8, renal function up form baseline with Creatinine at 1.48, HGB at 11.3. PSA was normal.   03/31/18 CT C/A/P revealed 1. Lytic lesion throughout all visualized vertebra from the lower cervical spine through the sacrum. Epidural extension of tumor T2, T3 and L2 level. Right L3-4 foraminal extension tumor. 2. Lytic lesions involving majority of ribs. Multiple pathologic rib fractures with bony expansion/sub pleural extension of tumor at several levels. 3. Lytic lesions of the scapula, hips bilaterally and pelvis bilaterally. With further progression of tumor, patient may be at risk for pathologic fractures. Pathologic  fracture right clavicle incompletely assessed. 4. Circumferential narrowing sigmoid colon. Question possibility of underlying mass (series 2, image 97). Alternatively this could represent muscular hypertrophy/peristalsis. 5. Gastric antrum circumferential narrowing. Cannot exclude mass although this may be related to peristalsis. 6. No primary lung malignancy noted. 7. Matted aortic pulmonary window lymph nodes. 8. Circumferential urinary bladder wall thickening greater anterior dome region. Right lateral bladder diverticulum. 9. 5 mm low-density lesion pancreatic body (series 5, image 10) unchanged. This is felt unlikely to be a primary malignancy. 10. Right parapelvic 2.2 cm cyst without significant change. Scattered low-density renal lesions bilaterally too small to adequately characterize. Some were present previously. Statistically these are likely cysts. 11. Stable adrenal gland hyperplasia. 12. Gynecomastia. 13. Prostate gland causes slight impression upon the bladder base. 14.  Aortic Atherosclerosis. 15. Sequential pacemaker in place. 16. Gallstones.   04/23/18 PET/CT revealed Innumerable hypermetabolic lytic lesions throughout the skeleton especially involving the spine, ribs, bony pelvis along with some involvement of both proximal femurs. The appearance is compatible with pathologic diagnosis of plasma cell neoplasm could well reflect multiple myeloma. Resulting pathologic fractures of the right lateral clavicle and of multiple bilateral ribs. Lesions are present along the cortical margins of the spinal canal. 2. Both the area and the stomach in the area in the sigmoid colon drawn attention to on prior CT scan appear benign normal today. 3. The confluence of AP window lymph nodes measures about 1.2 cm in short axis with maximum SUV 3.1, which is mildly above the blood pool merit surveillance. 4. Other imaging findings of potential clinical significance: Aortic Atherosclerosis. Coronary atherosclerosis.  Pacemaker noted. Borderline prostatomegaly. Cholelithiasis.  04/21/18 BM Bx revealed Normocellular bone marrow with Plasma Cell neoplasm. Scattered medium sized clusters of kappa-restricted plasma cells (14% aspirate, 10% CD138 immunohistochemistry.   I do suspect that the burden of disease is underestimated in the bone marrow sample given PET/CT findings of innumerable lytic lesions and an M spike of 3.4g   06/08/18 DG Hips bilat which revealed Innumerable lucencies are noted throughout the pelvis and proximal femurs, similar findings noted on prior PET-CT of 04/23/2018. Findings are consistent patient's known multiple myeloma. Femoral necks are intact. 2.  Aortoiliac atherosclerotic vascular disease.  08/06/2021 PET/CT revealed "1. Interval development of consolidative masslike opacities in both upper lobes demonstrating marked hypermetabolism. While these may be infectious/inflammatory, FDG accumulation is higher than typically seen in that setting. Metastatic disease not excluded. 2. Dominant left rib, right scapula, and left sacral lesions all show marked interval decrease in size and hypermetabolism. 3. Soft tissue nodule in the midline back adjacent to the L4 spinous process is similar in size and hypermetabolism today. 4. Interval decrease in size and hypermetabolism in the gastrohepatic lymph node identified previously. 5. Multiple scattered osseous lesions compatible with treated multiple myeloma. 6. Cholelithiasis."   2.  Pulmonary infiltrates  likely related to radiation related change. CT chest was discussed in detail with the patient  PLAN:    Labs done today discussed in detail with the patient Myeloma panel pending CBC stable with mild leukopenia mild thrombocytopenia and hemoglobin of 11.7 CMP stable Patient has no acute new symptoms suggestive of myeloma progression at this time Patient notes no notable toxicities from his current treatment at this time. Continue  Revlimid 5 mg 3 weeks on 1 week off with VTE prophylaxis Continue monthly daratumumab-- updated [facility] plan.  Follow-up Please schedule next 4 cycles of every 4-weekly daratumumab with labs MD visit in 8 weeks  The total time spent in the appointment was 30 minutes*.  All of the patient's questions were answered with apparent satisfaction. The patient knows to call the clinic with any problems, questions or concerns.   Sullivan Lone MD MS AAHIVMS [facility] Hematology/Oncology Physician [facility]  .*Total Encounter Time as defined by the Centers for Medicare and Medicaid Services includes, in addition to the face-to-face time of a patient visit (documented in the note above) non-face-to-face time: obtaining and reviewing outside history, ordering and reviewing medications, tests or procedures, care coordination (communications with other health care professionals or caregivers) and documentation in the medical record.  I, Melene Muller, am acting as scribe for Dr. Sullivan Lone, MD.  .I have reviewed the above documentation for accuracy and completeness, and I agree with the above. Brunetta Genera MD

## 2022-01-29 DIAGNOSIS — Z23 Encounter for immunization: Secondary | ICD-10-CM | POA: Diagnosis not present

## 2022-01-29 NOTE — Progress Notes (Signed)
Remote pacemaker transmission.   

## 2022-01-30 ENCOUNTER — Encounter: Payer: Self-pay | Admitting: Hematology

## 2022-01-30 LAB — MULTIPLE MYELOMA PANEL, SERUM
Albumin SerPl Elph-Mcnc: 3.4 g/dL (ref 2.9–4.4)
Albumin/Glob SerPl: 1.3 (ref 0.7–1.7)
Alpha 1: 0.2 g/dL (ref 0.0–0.4)
Alpha2 Glob SerPl Elph-Mcnc: 0.8 g/dL (ref 0.4–1.0)
B-Globulin SerPl Elph-Mcnc: 1 g/dL (ref 0.7–1.3)
Gamma Glob SerPl Elph-Mcnc: 0.6 g/dL (ref 0.4–1.8)
Globulin, Total: 2.7 g/dL (ref 2.2–3.9)
IgA: 219 mg/dL (ref 61–437)
IgG (Immunoglobin G), Serum: 637 mg/dL (ref 603–1613)
IgM (Immunoglobulin M), Srm: 69 mg/dL (ref 15–143)
Total Protein ELP: 6.1 g/dL (ref 6.0–8.5)

## 2022-02-07 ENCOUNTER — Ambulatory Visit (INDEPENDENT_AMBULATORY_CARE_PROVIDER_SITE_OTHER): Payer: Medicare Other

## 2022-02-07 VITALS — BP 120/60 | HR 60 | Temp 97.9°F | Ht 70.0 in | Wt 209.7 lb

## 2022-02-07 DIAGNOSIS — Z0000 Encounter for general adult medical examination without abnormal findings: Secondary | ICD-10-CM | POA: Diagnosis not present

## 2022-02-07 NOTE — Progress Notes (Cosign Needed Addendum)
Subjective:   Jonathon [name] is a 86 y.o. male who presents for Medicare Annual/Subsequent preventive examination.  Review of Systems     Cardiac Risk Factors include: advanced age (>13mn, >>6women);hypertension;male gender     Objective:    Today's Vitals   02/07/22 1043 02/07/22 1054  BP: 120/60   Pulse: 60   Temp: 97.9 F (36.6 C)   TempSrc: Oral   SpO2: 99%   Weight: 209 lb 11.2 oz (95.1 kg)   Height: _0  (1.778 m)   PainSc:  0-No pain   Body mass index is 30.09 kg/m.     02/07/2022   11:14 AM 10/18/2021    9:38 AM 09/30/2021    6:33 PM 08/14/2021   10:16 AM 05/22/2021   12:44 PM 04/10/2021   11:16 AM 02/28/2021   12:31 PM  Advanced Directives  Does Patient Have a Medical Advance Directive? _1  Yes Yes  Type of [place]amed[place]of [place] will [place] will Healthcare Power of APicuris PuebloLiving will HGlen JeanLiving will HHann[place]Living will  Does patient want to make changes to medical advance directive? No - Patient declined   No - Patient declined No - Patient declined No - Patient declined No - Patient declined  Copy of [place]in Chart? Yes - validated most recent copy scanned in chart (See row information) No - copy requested   Yes - validated most recent copy scanned in chart (See row information) Yes - validated most recent copy scanned in chart (See row information) Yes - validated most recent copy scanned in chart (See row information)  Would patient like information on creating a medical advance directive?     No - Patient declined No - Patient declined No - Patient declined    Current Medications (verified) Outpatient Encounter Medications as of 02/07/2022  Medication Sig   apixaban (ELIQUIS) 5 MG TABS tablet Take 1 tablet (5 mg total) by mouth 2 (two) times daily.   colchicine 0.6 MG tablet Take 2 tabs  (1.2 mg) now.  Then take 1 tab (0.6 mg) 1 hour later.  Then take 1 tab daily until gout flare resolves.   diltiazem (TIAZAC) 240 MG 24 hr capsule Take 1 capsule by mouth daily.   flecainide (TAMBOCOR) 150 MG tablet Take 0.5 tablets (75 mg total) by mouth 2 (two) times daily.   furosemide (LASIX) 40 MG tablet Take 40 mg by mouth daily.   irbesartan (AVAPRO) 300 MG tablet Take 300 mg by mouth daily.   lenalidomide (REVLIMID) 5 MG capsule TAKE 1 CAPSULE BY MOUTH 1 TIME A DAY   metoprolol tartrate (LOPRESSOR) 50 MG tablet Take 1 tablet (50 mg total) by mouth 2 (two) times daily.   polyethylene glycol (MIRALAX / GLYCOLAX) packet Take 17 g by mouth daily.   Saccharomyces boulardii (FLORASTOR PO) Take 250 mg by mouth 2 (two) times daily.   senna-docusate (SENOKOT-S) 8.6-50 MG tablet Take 2 tablets by mouth 2 (two) times daily as needed for mild constipation.   tamsulosin (FLOMAX) 0.4 MG CAPS capsule Take 2 capsules (0.8 mg total) by mouth daily.   traMADol (ULTRAM) 50 MG tablet Take 1 tablet (50 mg total) by mouth every 6 (six) hours as needed.   [DISCONTINUED] prochlorperazine (COMPAZINE) 10 MG tablet Take 1 tablet (10 mg total) by mouth every 6 (six) hours as needed (Nausea or vomiting).  Facility-Administered Encounter Medications as of 02/07/2022  Medication   [MAR Hold] sodium chloride flush (NS) 0.9 % injection 10 mL    Allergies (verified) Lexapro [escitalopram oxalate]   History: Past Medical History:  Diagnosis Date   Asthma    BENIGN PROSTATIC HYPERTROPHY, HX OF 10/25/2008   Bone metastases 04/14/2018   BRADYCARDIA 2005   Chronic diastolic congestive heart failure ([place]) 06/05/2016   CIDP (chronic inflammatory demyelinating polyneuropathy) ([place])    HYPERTENSION 11/21/2006   Iron deficiency anemia, unspecified 04/19/2013   Multiple myeloma ([place]) 04/29/2018   NEPHROLITHIASIS, HX OF 11/21/2006 and 10/15/2008   PACEMAKER, PERMANENT 2005   Gen change 2014 Medtronic Adaptic L dual-chamber  pacemaker, serial #[id number]    SVT (supraventricular tachycardia) ([place])    TOBACCO ABUSE 10/25/2008   Quit 2012   Past Surgical History:  Procedure Laterality Date   COLONOSCOPY  2004,2009   PACEMAKER INSERTION  05/14/2003   PERMANENT PACEMAKER GENERATOR CHANGE N/A 06/19/2012   Procedure: PERMANENT PACEMAKER GENERATOR CHANGE;  Surgeon: [name], MD; Medtronic Adaptic L dual-chamber pacemaker, serial #[id number]     SKIN GRAFT Right 05/13/1960   wrist   TOOTH EXTRACTION  11/10/2020   3 teeth removed   Family History  Problem Relation Age of Onset   Heart attack Father        Died, 1   Kidney disease Father    Parkinson's disease Mother        Died, 55   Breast cancer Sister        Living, 66   Diabetes type II Brother        Living, 54   Breast cancer Sister        Living, 54   Diabetes Daughter        Living, 62   Diabetes Son        Living, 55   Ovarian cancer Daughter    Colon cancer Neg Hx    Rectal cancer Neg Hx    Stomach cancer Neg Hx    Social History   Socioeconomic History   Marital status: Divorced    Spouse name: Not on file   Number of children: 4   Years of education: Not on file   Highest education level: Not on file  Occupational History   Occupation: ENVIROMENTAL SERVICE    Employer: [employer]   Occupation: retired  Tobacco Use   Smoking status: Former    Packs/day: 0.25    Years: 46.00    Total pack years: 11.50    Types: Cigarettes    Start date: 03/16/1965    Quit date: 06/15/2010    Years since quitting: 11.6   Smokeless tobacco: Never   Tobacco comments:    Former smoker 10/11/21 quit 3 years ago  Vaping Use   Vaping Use: Never used  Substance and Sexual Activity   Alcohol use: Yes    Alcohol/week: 6.0 - 10.0 standard drinks of alcohol    Types: 6 - 10 Glasses of wine per week    Comment: 1-2 glasses every weekend 10/11/21   Drug use: No   Sexual activity: Not on file  Other Topics Concern   Not on file   Social History Narrative   Has relocated from [place] in 2007. 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  Patient in a facility thru [facility]   Social Determinants of Health   Financial Resource Strain: Low Risk  (02/07/2022)   Overall Financial Resource Strain (CARDIA)    Difficulty of  Paying Living Expenses: Not hard at all  Food Insecurity: No Food Insecurity (02/07/2022)   Hunger Vital Sign    Worried About Running Out of Food in the Last Year: Never true    Ran Out of Food in the Last Year: Never true  Transportation Needs: No Transportation Needs (02/07/2022)   PRAPARE - Hydrologist (Medical): No    Lack of Transportation (Non-Medical): No  Physical Activity: Sufficiently Active (02/07/2022)   Exercise Vital Sign    Days of Exercise per Week: 7 days    Minutes of Exercise per Session: 50 min  Stress: No Stress Concern Present (02/07/2022)   [place]    Feeling of Stress : Not at all  Social Connections: Socially Isolated (02/07/2022)   Social Connection and Isolation Panel [NHANES]    Frequency of Communication with Friends and Family: More than three times a week    Frequency of Social Gatherings with Friends and Family: More than three times a week    Attends Religious Services: Never    Marine scientist or Organizations: No    Attends Archivist Meetings: Never    Marital Status: Divorced    Tobacco Counseling Counseling given: Not Answered Tobacco comments: Former smoker 10/11/21 quit 3 years ago   Clinical Intake:  Pre-visit preparation completed: No Diabetic?  No  Interpreter Needed?: No   Activities of Daily Living    02/07/2022   11:10 AM  In your present state of health, do you have any difficulty performing the following activities:  Hearing? 0  Vision? 0  Difficulty concentrating or making decisions? 0  Walking or climbing stairs? 0  Dressing or bathing? 0  Doing  errands, shopping? 0  Preparing Food and eating ? N  Using the Toilet? N  In the past six months, have you accidently leaked urine? N  Do you have problems with loss of bowel control? N  Managing your Medications? [place] assist  Managing your Finances? Y  Comment Daughter assist  Housekeeping or managing your Housekeeping? [place] assist    Patient Care Team: [name], MD as PCP - General (Family Medicine) [name], MD (Cardiology) [name], MD (Cardiology) [name], DO as Consulting Physician (Neurology)  Indicate any recent Medical Services you may have received from other than [facility] providers in the past year (date may be approximate).     Assessment:   This is a routine wellness examination for [place].  Hearing/Vision screen Hearing Screening - Comments:: Denies hearing difficulties   Vision Screening - Comments:: Wears reading glasses - up to date with routine eye exams with  Patient deferred  Dietary issues and exercise activities discussed: Current Exercise Habits: Home exercise routine, Type of exercise: walking, Time (Minutes): 50, Frequency (Times/Week): 7, Weekly Exercise (Minutes/Week): 350, Intensity: Moderate, Exercise limited by: None identified   Goals Addressed               This Visit's Progress     Stay Healthy (pt-stated)         Depression Screen    02/07/2022   11:08 AM 11/21/2021    2:33 PM 07/13/2021   10:09 AM 01/06/2017    3:44 PM 10/30/2015    8:33 AM 08/08/2014    8:04 AM 07/14/2013   12:00 PM  PHQ 2/9 Scores  PHQ - 2 Score 0 1 0  0 0 1 0  PHQ- 9 Score  4 6        Fall Risk    02/07/2022   11:13 AM 11/21/2021    2:33 PM 07/13/2021   10:08 AM 12/03/2018    1:23 PM 02/16/2018    3:22 PM  Fall Risk   Falls in the past year? 0 0 0 0 Yes  Number falls in past yr: 0 0  0 1  Injury with Fall? 0 0 0 0 Yes  Risk Factor Category      High Fall Risk  Risk for fall due to : No Fall Risks No Fall  Risks     Follow up Falls prevention discussed Falls evaluation completed   Falls evaluation completed    [place]:  Any stairs in or around the home? Yes  If so, are there any without handrails? No  Home free of loose throw rugs in walkways, pet beds, electrical cords, etc? Yes  Adequate lighting in your home to reduce risk of falls? Yes   ASSISTIVE DEVICES UTILIZED TO PREVENT FALLS:  Life alert? No  Use of a cane, walker or w/c? Yes  Grab bars in the bathroom? Yes  Shower chair or bench in shower? Yes  Elevated toilet seat or a handicapped toilet? Yes   TIMED UP AND GO:  Was the test performed? Yes .  Length of time to ambulate 10 feet: 10 sec.   Gait slow and steady without use of assistive device  Cognitive Function:    05/27/2017    4:00 PM  MMSE - Mini Mental State Exam  Not completed: Unable to complete      01/08/2017   11:27 AM  Montreal Cognitive Assessment   Visuospatial/ Executive (0/5) 3  Naming (0/3) 3  Attention: Read list of digits (0/2) 2  Attention: Read list of letters (0/1) 0  Attention: Serial 7 subtraction starting at 100 (0/3) 1  Language: Repeat phrase (0/2) 2  Language : Fluency (0/1) 0  Abstraction (0/2) 2  Delayed Recall (0/5) 4  Orientation (0/6) 5  Total 22  Adjusted Score (based on education) 23      02/07/2022   11:14 AM  6CIT Screen  What Year? 0 points  What month? 0 points  What time? 0 points  Count back from 20 0 points  Months in reverse 0 points  Repeat phrase 0 points  Total Score 0 points    Immunizations Immunization History  Administered Date(s) Administered   Fluad Quad(high Dose 65+) 02/08/2019   Influenza Whole 02/10/2010   Influenza, High Dose Seasonal PF 01/30/2015, 04/30/2016, 01/28/2017   Influenza,inj,Quad PF,6+ Mos 01/19/2013   Influenza-Unspecified 02/03/2014   PPD Test 01/28/2017   Pneumococcal Conjugate-13 08/08/2014, 03/08/2019   Pneumococcal  Polysaccharide-23 01/14/2013, 05/03/2019   Tetanus 01/14/2013    TDAP status: Up to date  Flu Vaccine status: Up to date  Pneumococcal vaccine status: Up to date  Covid-19 vaccine status: Completed vaccines  Qualifies for Shingles Vaccine? No   Zostavax completed No   Shingrix Completed?: No.    Education has been provided regarding the importance of this vaccine. Patient has been advised to call insurance company to determine out of pocket expense if they have not yet received this vaccine. Advised may also receive vaccine at local pharmacy or Health Dept. Verbalized acceptance and understanding.  Screening Tests Health Maintenance  Topic Date Due   Zoster Vaccines- Shingrix (1 of 2) Never  done   INFLUENZA VACCINE  02/08/2022 (Originally 12/11/2021)   COVID-19 Vaccine (1) 02/23/2022 (Originally 01/26/1941)   TETANUS/TDAP  01/15/2023   Pneumonia Vaccine 77+ Years old  Completed   HPV VACCINES  Aged Out    Health Maintenance  Health Maintenance Due  Topic Date Due   Zoster Vaccines- Shingrix (1 of 2) Never done    Colorectal cancer screening: No longer required.   Lung Cancer Screening: (Low Dose CT Chest recommended if Age 23-80 years, 30 pack-year currently smoking OR have quit w/in 15years.) does not qualify.    Additional Screening:  Hepatitis C Screening: does not qualify; Completed   Vision Screening: Recommended annual ophthalmology exams for early detection of glaucoma and other disorders of the eye. Is the patient up to date with their annual eye exam?  No  Who is the provider or what is the name of the office in which the patient attends annual eye exams? Patient deferred If pt is not established with a provider, would they like to be referred to a provider to establish care? No .   Dental Screening: Recommended annual dental exams for proper oral hygiene  Community Resource Referral / Chronic Care Management:  CRR required this visit?  No   CCM required  this visit?  No      Plan:     I have personally reviewed and noted the following in the patient's chart:   Medical and social history Use of alcohol, tobacco or illicit drugs  Current medications and supplements including opioid prescriptions. Patient is currently taking opioid prescriptions. Information provided to patient regarding non-opioid alternatives. Patient advised to discuss non-opioid treatment plan with their provider. Functional ability and status Nutritional status Physical activity Advanced directives List of other physicians Hospitalizations, surgeries, and ER visits in previous 12 months Vitals Screenings to include cognitive, depression, and falls Referrals and appointments  In addition, I have reviewed and discussed with patient certain preventive protocols, quality metrics, and best practice recommendations. A written personalized care plan for preventive services as well as general preventive health recommendations were provided to patient.     Criselda Peaches, LPN   [id number]   Nurse Notes: None

## 2022-02-07 NOTE — Patient Instructions (Addendum)
Jonathon [name] , Thank you for taking time to come for your Medicare Wellness Visit. I appreciate your ongoing commitment to your health goals. Please review the following plan we discussed and let me know if I can assist you in the future.   These are the goals we discussed:  Goals       Stay Healthy (pt-stated)        This is a list of the screening recommended for you and due dates:  Health Maintenance  Topic Date Due   Zoster (Shingles) Vaccine (1 of 2) Never done   Flu Shot  02/08/2022*   COVID-19 Vaccine (1) 02/23/2022*   Tetanus Vaccine  01/15/2023   Pneumonia Vaccine  Completed   HPV Vaccine  Aged Out  *Topic was postponed. The date shown is not the original due date.  Opioid Pain Medicine Management Opioids are powerful medicines that are used to treat moderate to severe pain. When used for short periods of time, they can help you to: Sleep better. Do better in physical or occupational therapy. Feel better in the first few days after an injury. Recover from surgery. Opioids should be taken with the supervision of a trained health care provider. They should be taken for the shortest period of time possible. This is because opioids can be addictive, and the longer you take opioids, the greater your risk of addiction. This addiction can also be called opioid use disorder. What are the risks? Using opioid pain medicines for longer than 3 days increases your risk of side effects. Side effects include: Constipation. Nausea and vomiting. Breathing difficulties (respiratory depression). Drowsiness. Confusion. Opioid use disorder. Itching. Taking opioid pain medicine for a long period of time can affect your ability to do daily tasks. It also puts you at risk for: Motor vehicle crashes. Depression. Suicide. Heart attack. Overdose, which can be life-threatening. What is a pain treatment plan? A pain treatment plan is an agreement between you and your health care provider. Pain is  unique to each person, and treatments vary depending on your condition. To manage your pain, you and your health care provider need to work together. To help you do this: Discuss the goals of your treatment, including how much pain you might expect to have and how you will manage the pain. Review the risks and benefits of taking opioid medicines. Remember that a good treatment plan uses more than one approach and minimizes the chance of side effects. Be honest about the amount of medicines you take and about any drug or alcohol use. Get pain medicine prescriptions from only one health care provider. Pain can be managed with many types of alternative treatments. Ask your health care provider to refer you to one or more specialists who can help you manage pain through: Physical or occupational therapy. Counseling (cognitive behavioral therapy). Good nutrition. Biofeedback. Massage. Meditation. Non-opioid medicine. Following a gentle exercise program. How to use opioid pain medicine Taking medicine Take your pain medicine exactly as told by your health care provider. Take it only when you need it. If your pain gets less severe, you may take less than your prescribed dose if your health care provider approves. If you are not having pain, do nottake pain medicine unless your health care provider tells you to take it. If your pain is severe, do nottry to treat it yourself by taking more pills than instructed on your prescription. Contact your health care provider for help. Write down the times when you take your  pain medicine. It is easy to become confused while on pain medicine. Writing the time can help you avoid overdose. Take other over-the-counter or prescription medicines only as told by your health care provider. Keeping yourself and others safe  While you are taking opioid pain medicine: Do not drive, use machinery, or power tools. Do not sign legal documents. Do not drink alcohol. Do  not take sleeping pills. Do not supervise children by yourself. Do not do activities that require climbing or being in high places. Do not go to a lake, river, ocean, spa, or swimming pool. Do not share your pain medicine with anyone. Keep pain medicine in a locked cabinet or in a secure area where pets and children cannot reach it. Stopping your use of opioids If you have been taking opioid medicine for more than a few weeks, you may need to slowly decrease (taper) how much you take until you stop completely. Tapering your use of opioids can decrease your risk of symptoms of withdrawal, such as: Pain and cramping in the abdomen. Nausea. Sweating. Sleepiness. Restlessness. Uncontrollable shaking (tremors). Cravings for the medicine. Do not attempt to taper your use of opioids on your own. Talk with your health care provider about how to do this. Your health care provider may prescribe a step-down schedule based on how much medicine you are taking and how long you have been taking it. Getting rid of leftover pills Do not save any leftover pills. Get rid of leftover pills safely by: Taking the medicine to a prescription take-back program. This is usually offered by the county or law enforcement. Bringing them to a pharmacy that has a drug disposal container. Flushing them down the toilet. Check the label or package insert of your medicine to see whether this is safe to do. Throwing them out in the trash. Check the label or package insert of your medicine to see whether this is safe to do. If it is safe to throw it out, remove the medicine from the original container, put it into a sealable bag or container, and mix it with used coffee grounds, food scraps, dirt, or cat litter before putting it in the trash. Follow these instructions at home: Activity Do exercises as told by your health care provider. Avoid activities that make your pain worse. Return to your normal activities as told by your  health care provider. Ask your health care provider what activities are safe for you. General instructions You may need to take these actions to prevent or treat constipation: Drink enough fluid to keep your urine pale yellow. Take over-the-counter or prescription medicines. Eat foods that are high in fiber, such as beans, whole grains, and fresh fruits and vegetables. Limit foods that are high in fat and processed sugars, such as fried or sweet foods. Keep all follow-up visits. This is important. Where to find support If you have been taking opioids for a long time, you may benefit from receiving support for quitting from a local support group or counselor. Ask your health care provider for a referral to these resources in your area. Where to find more information Centers for Disease Control and Prevention (CDC): http://www.wolf.info/ [place] Food and Drug Administration (FDA): [place] Get help right away if: You may have taken too much of an opioid (overdosed). Common symptoms of an overdose: Your breathing is slower or more shallow than normal. You have a very slow heartbeat (pulse). You have slurred speech. You have nausea and vomiting. Your pupils become very  small. You have other potential symptoms: You are very confused. You faint or feel like you will faint. You have cold, clammy skin. You have blue lips or fingernails. You have thoughts of harming yourself or harming others. These symptoms may represent a serious problem that is an emergency. Do not wait to see if the symptoms will go away. Get medical help right away. Call your local emergency services (911 in the [place] Do not drive yourself to the hospital.  If you ever feel like you may hurt yourself or others, or have thoughts about taking your own life, get help right away. Go to your nearest emergency department or: Call your local emergency services (911 in the [place] Call the [facility] 9171694672 in the  [place] Call a suicide crisis helpline, such as the [place] at (863) 646-9829 or 988 in the [place]. This is open 24 hours a day in the [place] Text the Crisis Text Line at (680)046-6963 (in the [place] Summary Opioid medicines can help you manage moderate to severe pain for a short period of time. A pain treatment plan is an agreement between you and your health care provider. Discuss the goals of your treatment, including how much pain you might expect to have and how you will manage the pain. If you think that you or someone else may have taken too much of an opioid, get medical help right away. This information is not intended to replace advice given to you by your health care provider. Make sure you discuss any questions you have with your health care provider. Document Revised: 11/22/2020 Document Reviewed: 08/09/2020 Elsevier Patient Education  [place] directives: In chart  Conditions/risks identified: None  Next appointment: Follow up in one year for your annual wellness visit.   Preventive Care 76 Years and Older, Male  Preventive care refers to lifestyle choices and visits with your health care provider that can promote health and wellness. What does preventive care include? A yearly physical exam. This is also called an annual well check. Dental exams once or twice a year. Routine eye exams. Ask your health care provider how often you should have your eyes checked. Personal lifestyle choices, including: Daily care of your teeth and gums. Regular physical activity. Eating a healthy diet. Avoiding tobacco and drug use. Limiting alcohol use. Practicing safe sex. Taking low doses of aspirin every day. Taking vitamin and mineral supplements as recommended by your health care provider. What happens during an annual well check? The services and screenings done by your health care provider during your annual well check will depend on your age,  overall health, lifestyle risk factors, and family history of disease. Counseling  Your health care provider may ask you questions about your: Alcohol use. Tobacco use. Drug use. Emotional well-being. Home and relationship well-being. Sexual activity. Eating habits. History of falls. Memory and ability to understand (cognition). Work and work Statistician. Screening  You may have the following tests or measurements: Height, weight, and BMI. Blood pressure. Lipid and cholesterol levels. These may be checked every 5 years, or more frequently if you are over 51 years old. Skin check. Lung cancer screening. You may have this screening every year starting at age 47 if you have a 30-pack-year history of smoking and currently smoke or have quit within the past 15 years. Fecal occult blood test (FOBT) of the stool. You may have this test every year starting at age 53. Flexible sigmoidoscopy or  colonoscopy. You may have a sigmoidoscopy every 5 years or a colonoscopy every 10 years starting at age 43. Prostate cancer screening. Recommendations will vary depending on your family history and other risks. Hepatitis C blood test. Hepatitis B blood test. Sexually transmitted disease (STD) testing. Diabetes screening. This is done by checking your blood sugar (glucose) after you have not eaten for a while (fasting). You may have this done every 1-3 years. Abdominal aortic aneurysm (AAA) screening. You may need this if you are a current or former smoker. Osteoporosis. You may be screened starting at age 32 if you are at high risk. Talk with your health care provider about your test results, treatment options, and if necessary, the need for more tests. Vaccines  Your health care provider may recommend certain vaccines, such as: Influenza vaccine. This is recommended every year. Tetanus, diphtheria, and acellular pertussis (Tdap, Td) vaccine. You may need a Td booster every 10 years. Zoster vaccine.  You may need this after age 15. Pneumococcal 13-valent conjugate (PCV13) vaccine. One dose is recommended after age 40. Pneumococcal polysaccharide (PPSV23) vaccine. One dose is recommended after age 73. Talk to your health care provider about which screenings and vaccines you need and how often you need them. This information is not intended to replace advice given to you by your health care provider. Make sure you discuss any questions you have with your health care provider. Document Released: 05/26/2015 Document Revised: 01/17/2016 Document Reviewed: 02/28/2015 Elsevier Interactive Patient Education  2017 [place] Prevention in the Home Falls can cause injuries. They can happen to people of all ages. There are many things you can do to make your home safe and to help prevent falls. What can I do on the outside of my home? Regularly fix the edges of walkways and driveways and fix any cracks. Remove anything that might make you trip as you walk through a door, such as a raised step or threshold. Trim any bushes or trees on the path to your home. Use bright outdoor lighting. Clear any walking paths of anything that might make someone trip, such as rocks or tools. Regularly check to see if handrails are loose or broken. Make sure that both sides of any steps have handrails. Any raised decks and porches should have guardrails on the edges. Have any leaves, snow, or ice cleared regularly. Use sand or salt on walking paths during winter. Clean up any spills in your garage right away. This includes oil or grease spills. What can I do in the bathroom? Use night lights. Install grab bars by the toilet and in the tub and shower. Do not use towel bars as grab bars. Use non-skid mats or decals in the tub or shower. If you need to sit down in the shower, use a plastic, non-slip stool. Keep the floor dry. Clean up any water that spills on the floor as soon as it happens. Remove soap  buildup in the tub or shower regularly. Attach bath mats securely with double-sided non-slip rug tape. Do not have throw rugs and other things on the floor that can make you trip. What can I do in the bedroom? Use night lights. Make sure that you have a light by your bed that is easy to reach. Do not use any sheets or blankets that are too big for your bed. They should not hang down onto the floor. Have a firm chair that has side arms. You can use this for support while  you get dressed. Do not have throw rugs and other things on the floor that can make you trip. What can I do in the kitchen? Clean up any spills right away. Avoid walking on wet floors. Keep items that you use a lot in easy-to-reach places. If you need to reach something above you, use a strong step stool that has a grab bar. Keep electrical cords out of the way. Do not use floor polish or wax that makes floors slippery. If you must use wax, use non-skid floor wax. Do not have throw rugs and other things on the floor that can make you trip. What can I do with my stairs? Do not leave any items on the stairs. Make sure that there are handrails on both sides of the stairs and use them. Fix handrails that are broken or loose. Make sure that handrails are as long as the stairways. Check any carpeting to make sure that it is firmly attached to the stairs. Fix any carpet that is loose or worn. Avoid having throw rugs at the top or bottom of the stairs. If you do have throw rugs, attach them to the floor with carpet tape. Make sure that you have a light switch at the top of the stairs and the bottom of the stairs. If you do not have them, ask someone to add them for you. What else can I do to help prevent falls? Wear shoes that: Do not have high heels. Have rubber bottoms. Are comfortable and fit you well. Are closed at the toe. Do not wear sandals. If you use a stepladder: Make sure that it is fully opened. Do not climb a closed  stepladder. Make sure that both sides of the stepladder are locked into place. Ask someone to hold it for you, if possible. Clearly mark and make sure that you can see: Any grab bars or handrails. First and last steps. Where the edge of each step is. Use tools that help you move around (mobility aids) if they are needed. These include: Canes. Walkers. Scooters. Crutches. Turn on the lights when you go into a dark area. Replace any light bulbs as soon as they burn out. Set up your furniture so you have a clear path. Avoid moving your furniture around. If any of your floors are uneven, fix them. If there are any pets around you, be aware of where they are. Review your medicines with your doctor. Some medicines can make you feel dizzy. This can increase your chance of falling. Ask your doctor what other things that you can do to help prevent falls. This information is not intended to replace advice given to you by your health care provider. Make sure you discuss any questions you have with your health care provider. Document Released: 02/23/2009 Document Revised: 10/05/2015 Document Reviewed: 06/03/2014 Elsevier Interactive Patient Education  2017 [employer].

## 2022-02-08 ENCOUNTER — Other Ambulatory Visit: Payer: Self-pay

## 2022-02-14 ENCOUNTER — Other Ambulatory Visit: Payer: Self-pay | Admitting: Hematology

## 2022-02-14 DIAGNOSIS — C9000 Multiple myeloma not having achieved remission: Secondary | ICD-10-CM

## 2022-02-20 ENCOUNTER — Inpatient Hospital Stay (HOSPITAL_BASED_OUTPATIENT_CLINIC_OR_DEPARTMENT_OTHER): Payer: Medicare Other | Admitting: Hematology

## 2022-02-20 ENCOUNTER — Other Ambulatory Visit: Payer: Self-pay

## 2022-02-20 ENCOUNTER — Inpatient Hospital Stay: Payer: Medicare Other

## 2022-02-20 ENCOUNTER — Inpatient Hospital Stay: Payer: Medicare Other | Attending: Hematology

## 2022-02-20 VITALS — BP 135/73 | HR 60 | Temp 97.7°F | Resp 16 | Wt 211.5 lb

## 2022-02-20 DIAGNOSIS — C7951 Secondary malignant neoplasm of bone: Secondary | ICD-10-CM | POA: Diagnosis not present

## 2022-02-20 DIAGNOSIS — Z7189 Other specified counseling: Secondary | ICD-10-CM

## 2022-02-20 DIAGNOSIS — C9001 Multiple myeloma in remission: Secondary | ICD-10-CM

## 2022-02-20 DIAGNOSIS — Z5112 Encounter for antineoplastic immunotherapy: Secondary | ICD-10-CM | POA: Diagnosis not present

## 2022-02-20 DIAGNOSIS — C9002 Multiple myeloma in relapse: Secondary | ICD-10-CM | POA: Diagnosis not present

## 2022-02-20 LAB — CBC WITH DIFFERENTIAL (CANCER CENTER ONLY)
Abs Immature Granulocytes: 0.01 10*3/uL (ref 0.00–0.07)
Basophils Absolute: 0.1 10*3/uL (ref 0.0–0.1)
Basophils Relative: 3 %
Eosinophils Absolute: 0.4 10*3/uL (ref 0.0–0.5)
Eosinophils Relative: 9 %
HCT: 35.9 % — ABNORMAL LOW (ref 39.0–52.0)
Hemoglobin: 12 g/dL — ABNORMAL LOW (ref 13.0–17.0)
Immature Granulocytes: 0 %
Lymphocytes Relative: 44 %
Lymphs Abs: 1.7 10*3/uL (ref 0.7–4.0)
MCH: 29.4 pg (ref 26.0–34.0)
MCHC: 33.4 g/dL (ref 30.0–36.0)
MCV: 88 fL (ref 80.0–100.0)
Monocytes Absolute: 0.6 10*3/uL (ref 0.1–1.0)
Monocytes Relative: 14 %
Neutro Abs: 1.2 10*3/uL — ABNORMAL LOW (ref 1.7–7.7)
Neutrophils Relative %: 30 %
Platelet Count: 132 10*3/uL — ABNORMAL LOW (ref 150–400)
RBC: 4.08 MIL/uL — ABNORMAL LOW (ref 4.22–5.81)
RDW: 15 % (ref 11.5–15.5)
WBC Count: 3.9 10*3/uL — ABNORMAL LOW (ref 4.0–10.5)
nRBC: 0 % (ref 0.0–0.2)

## 2022-02-20 MED ORDER — FAMOTIDINE IN NACL 20-0.9 MG/50ML-% IV SOLN
20.0000 mg | Freq: Once | INTRAVENOUS | Status: AC
  Administered 2022-02-20: 20 mg via INTRAVENOUS
  Filled 2022-02-20: qty 50

## 2022-02-20 MED ORDER — MONTELUKAST SODIUM 10 MG PO TABS
10.0000 mg | ORAL_TABLET | Freq: Once | ORAL | Status: AC
  Administered 2022-02-20: 10 mg via ORAL
  Filled 2022-02-20: qty 1

## 2022-02-20 MED ORDER — ACETAMINOPHEN 325 MG PO TABS
650.0000 mg | ORAL_TABLET | Freq: Once | ORAL | Status: AC
  Administered 2022-02-20: 650 mg via ORAL
  Filled 2022-02-20: qty 2

## 2022-02-20 MED ORDER — DIPHENHYDRAMINE HCL 25 MG PO CAPS
50.0000 mg | ORAL_CAPSULE | Freq: Once | ORAL | Status: AC
  Administered 2022-02-20: 50 mg via ORAL
  Filled 2022-02-20: qty 2

## 2022-02-20 MED ORDER — DEXAMETHASONE 4 MG PO TABS
20.0000 mg | ORAL_TABLET | Freq: Once | ORAL | Status: AC
  Administered 2022-02-20: 20 mg via ORAL
  Filled 2022-02-20: qty 5

## 2022-02-20 MED ORDER — SODIUM CHLORIDE 0.9 % IV SOLN: Freq: Once | INTRAVENOUS | Status: AC

## 2022-02-20 MED ORDER — SODIUM CHLORIDE 0.9 % IV SOLN
16.0000 mg/kg | Freq: Once | INTRAVENOUS | Status: AC
  Administered 2022-02-20: 1500 mg via INTRAVENOUS
  Filled 2022-02-20: qty 60

## 2022-02-20 NOTE — Progress Notes (Signed)
Ok to treat today per Dr. Irene Limbo with [facility]

## 2022-02-20 NOTE — Progress Notes (Signed)
HEMATOLOGY/ONCOLOGY CLINIC NOTE  Date of Service: 02/20/22    Patient Care Team: [name], MD as PCP - General (Family Medicine) [name], MD (Cardiology) [name], MD (Cardiology) [name], DO as Consulting Physician (Neurology)  CHIEF COMPLAINTS/PURPOSE OF CONSULTATION:  Follow-up for continued evaluation and management of multiple myeloma  HISTORY OF PRESENTING ILLNESS:  Please see previous note for details on initial presentation.  INTERVAL HISTORY:   [name] is a 86 y.o. male here for continued evaluation and management of multiple myeloma.  He was last seen by me on 01/23/2022 and was doing well with no new symptoms or concerns.  Today, he is doing well with no new issues. He denies bone pain, diarrhea, chest pain, abdominal pain. He complains of grade 1 left leg swelling and mild back pain while standing. He denies any problem with his infusion.  He had the influenza vaccine, RSV vaccine, and COVID-19 booster two weeks ago with no reaction.    MEDICAL HISTORY:  Past Medical History:  Diagnosis Date   Asthma    BENIGN PROSTATIC HYPERTROPHY, HX OF 10/25/2008   Bone metastases 04/14/2018   BRADYCARDIA 2005   Chronic diastolic congestive heart failure ([place]) 06/05/2016   CIDP (chronic inflammatory demyelinating polyneuropathy) ([place])    HYPERTENSION 11/21/2006   Iron deficiency anemia, unspecified 04/19/2013   Multiple myeloma ([place]) 04/29/2018   NEPHROLITHIASIS, HX OF 11/21/2006 and 10/15/2008   PACEMAKER, PERMANENT 2005   Gen change 2014 Medtronic Adaptic L dual-chamber pacemaker, serial #[id number]    SVT (supraventricular tachycardia) ([place])    TOBACCO ABUSE 10/25/2008   Quit 2012    SURGICAL HISTORY: Past Surgical History:  Procedure Laterality Date   COLONOSCOPY  2004,2009   PACEMAKER INSERTION  05/14/2003   PERMANENT PACEMAKER GENERATOR CHANGE N/A 06/19/2012   Procedure: PERMANENT PACEMAKER GENERATOR CHANGE;  Surgeon: [name], MD; Medtronic Adaptic L dual-chamber pacemaker, serial #[id number]     SKIN GRAFT Right 05/13/1960   wrist   TOOTH EXTRACTION  11/10/2020   3 teeth removed    SOCIAL HISTORY: Social History   Socioeconomic History   Marital status: Divorced    Spouse name: Not on file   Number of children: 4   Years of education: Not on file   Highest education level: Not on file  Occupational History   Occupation: ENVIROMENTAL SERVICE    Employer: [employer]   Occupation: retired  Tobacco Use   Smoking status: Former    Packs/day: 0.25    Years: 46.00    Total pack years: 11.50    Types: Cigarettes    Start date: 03/16/1965    Quit date: 06/15/2010    Years since quitting: 11.6   Smokeless tobacco: Never   Tobacco comments:    Former smoker 10/11/21 quit 3 years ago  Vaping Use   Vaping Use: Never used  Substance and Sexual Activity   Alcohol use: Yes    Alcohol/week: 6.0 - 10.0 standard drinks of alcohol    Types: 6 - 10 Glasses of wine per week    Comment: 1-2 glasses every weekend 10/11/21   Drug use: No   Sexual activity: Not on file  Other Topics Concern   Not on file  Social History Narrative   Has relocated from [place] in 2007. 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  Patient in a facility thru [facility]   Social Determinants of Health   Financial Resource Strain: Low Risk  (02/07/2022)  Overall Financial Resource Strain (CARDIA)    Difficulty of Paying Living Expenses: Not hard at all  Food Insecurity: No Food Insecurity (02/07/2022)   Hunger Vital Sign    Worried About Running Out of Food in the Last Year: Never true    Ran Out of Food in the Last Year: Never true  Transportation Needs: No Transportation Needs (02/07/2022)   PRAPARE - Hydrologist (Medical): No    Lack of Transportation (Non-Medical): No  Physical Activity: Sufficiently Active (02/07/2022)   Exercise Vital Sign    Days of Exercise per Week: 7 days    Minutes of Exercise per  Session: 50 min  Stress: No Stress Concern Present (02/07/2022)   [place]    Feeling of Stress : Not at all  Social Connections: Socially Isolated (02/07/2022)   Social Connection and Isolation Panel [NHANES]    Frequency of Communication with Friends and Family: More than three times a week    Frequency of Social Gatherings with Friends and Family: More than three times a week    Attends Religious Services: Never    Marine scientist or Organizations: No    Attends Archivist Meetings: Never    Marital Status: Divorced  Human resources officer Violence: Not At Risk (02/07/2022)   Humiliation, Afraid, Rape, and Kick questionnaire    Fear of Current or Ex-Partner: No    Emotionally Abused: No    Physically Abused: No    Sexually Abused: No    FAMILY HISTORY: Family History  Problem Relation Age of Onset   Heart attack Father        Died, 24   Kidney disease Father    Parkinson's disease Mother        Died, 66   Breast cancer Sister        Living, 23   Diabetes type II Brother        Living, 35   Breast cancer Sister        Living, 53   Diabetes Daughter        Living, 13   Diabetes Son        Living, 21   Ovarian cancer Daughter    Colon cancer Neg Hx    Rectal cancer Neg Hx    Stomach cancer Neg Hx     ALLERGIES:  is allergic to lexapro [escitalopram oxalate].  MEDICATIONS:  Current Outpatient Medications  Medication Sig Dispense Refill   apixaban (ELIQUIS) 5 MG TABS tablet Take 1 tablet (5 mg total) by mouth 2 (two) times daily. 60 tablet 11   colchicine 0.6 MG tablet Take 2 tabs (1.2 mg) now.  Then take 1 tab (0.6 mg) 1 hour later.  Then take 1 tab daily until gout flare resolves. 14 tablet 0   diltiazem (TIAZAC) 240 MG 24 hr capsule Take 1 capsule by mouth daily.     flecainide (TAMBOCOR) 150 MG tablet Take 0.5 tablets (75 mg total) by mouth 2 (two) times daily. 90 tablet 3   furosemide  (LASIX) 40 MG tablet Take 40 mg by mouth daily.     irbesartan (AVAPRO) 300 MG tablet Take 300 mg by mouth daily.     lenalidomide (REVLIMID) 5 MG capsule TAKE 1 CAPSULE BY MOUTH 1 TIME A DAY 21 capsule 0   metoprolol tartrate (LOPRESSOR) 50 MG tablet Take 1 tablet (50 mg total) by mouth 2 (two) times daily. [place]  tablet 0   polyethylene glycol (MIRALAX / GLYCOLAX) packet Take 17 g by mouth daily. 14 each 0   Saccharomyces boulardii (FLORASTOR PO) Take 250 mg by mouth 2 (two) times daily.     senna-docusate (SENOKOT-S) 8.6-50 MG tablet Take 2 tablets by mouth 2 (two) times daily as needed for mild constipation. 30 tablet 0   tamsulosin (FLOMAX) 0.4 MG CAPS capsule Take 2 capsules (0.8 mg total) by mouth daily. 90 capsule 0   traMADol (ULTRAM) 50 MG tablet Take 1 tablet (50 mg total) by mouth every 6 (six) hours as needed. 30 tablet 0   No current facility-administered medications for this visit.   Facility-Administered Medications Ordered in Other Visits  Medication Dose Route Frequency Provider Last Rate Last Admin   [MAR Hold] sodium chloride flush (NS) 0.9 % injection 10 mL  10 mL Intracatheter PRN Brunetta Genera, MD        REVIEW OF SYSTEMS:   10 Point review of Systems was done is negative except as noted above.  PHYSICAL EXAMINATION: ECOG PERFORMANCE STATUS: 2 - Symptomatic, <50% confined to bed  NAD GENERAL:alert, in no acute distress and comfortable SKIN: no acute rashes, no significant lesions EYES: conjunctiva are pink and non-injected, sclera anicteric NECK: supple, no JVD LYMPH:  no palpable lymphadenopathy in the cervical, axillary or inguinal regions LUNGS: clear to auscultation b/l with normal respiratory effort HEART: regular rate & rhythm ABDOMEN:  normoactive bowel sounds , non tender, not distended. Extremity: no pedal edema PSYCH: alert & oriented x 3 with fluent speech NEURO: no focal motor/sensory deficits  LABORATORY DATA:  I have reviewed the data as  listed .    Latest Ref Rng & Units 01/23/2022   10:11 AM 12/25/2021    9:00 AM 11/28/2021   10:38 AM  CBC  WBC 4.0 - 10.5 K/uL 3.4  3.1  3.1   Hemoglobin 13.0 - 17.0 g/dL 11.7  11.6  11.1   Hematocrit 39.0 - 52.0 % 34.6  33.9  32.8   Platelets 150 - 400 K/uL 124  100  107        Latest Ref Rng & Units 01/23/2022   10:11 AM 12/25/2021    9:00 AM 11/28/2021   10:38 AM  CMP  Glucose 70 - 99 mg/dL 119  118  109   BUN 8 - 23 mg/dL _0 Creatinine 0.61 - 1.24 mg/dL 1.21  1.31  1.09   Sodium 135 - 145 mmol/L 138  139  137   Potassium 3.5 - 5.1 mmol/L 3.9  3.8  3.6   Chloride 98 - 111 mmol/L 105  108  104   CO2 22 - 32 mmol/L _1 Calcium 8.9 - 10.3 mg/dL 9.4  9.5  9.2   Total Protein 6.5 - 8.1 g/dL 6.3  6.5  6.4   Total Bilirubin 0.3 - 1.2 mg/dL 0.5  0.7  0.5   Alkaline Phos 38 - 126 U/L 143  146  136   AST 15 - 41 U/L _2 ALT 0 - 44 U/L _3 04/17/18 BM Bx:    RADIOGRAPHIC STUDIES: I have personally reviewed the radiological images as listed and agreed with the findings in the report. No results found.  ASSESSMENT & PLAN:   86 y.o. male with  1.  Relapsed multiple myeloma multiple bone metastases  labs upon initial presentation from 04/14/18; hypercalcemic with Calcium at 13.8, renal function up form baseline with Creatinine at 1.48, HGB at 11.3. PSA was normal.   03/31/18 CT C/A/P revealed 1. Lytic lesion throughout all visualized vertebra from the lower cervical spine through the sacrum. Epidural extension of tumor T2, T3 and L2 level. Right L3-4 foraminal extension tumor. 2. Lytic lesions involving majority of ribs. Multiple pathologic rib fractures with bony expansion/sub pleural extension of tumor at several levels. 3. Lytic lesions of the scapula, hips bilaterally and pelvis bilaterally. With further progression of tumor, patient may be at risk for pathologic fractures. Pathologic fracture right clavicle incompletely assessed. 4.  Circumferential narrowing sigmoid colon. Question possibility of underlying mass (series 2, image 97). Alternatively this could represent muscular hypertrophy/peristalsis. 5. Gastric antrum circumferential narrowing. Cannot exclude mass although this may be related to peristalsis. 6. No primary lung malignancy noted. 7. Matted aortic pulmonary window lymph nodes. 8. Circumferential urinary bladder wall thickening greater anterior dome region. Right lateral bladder diverticulum. 9. 5 mm low-density lesion pancreatic body (series 5, image 10) unchanged. This is felt unlikely to be a primary malignancy. 10. Right parapelvic 2.2 cm cyst without significant change. Scattered low-density renal lesions bilaterally too small to adequately characterize. Some were present previously. Statistically these are likely cysts. 11. Stable adrenal gland hyperplasia. 12. Gynecomastia. 13. Prostate gland causes slight impression upon the bladder base. 14.  Aortic Atherosclerosis. 15. Sequential pacemaker in place. 16. Gallstones.   04/23/18 PET/CT revealed Innumerable hypermetabolic lytic lesions throughout the skeleton especially involving the spine, ribs, bony pelvis along with some involvement of both proximal femurs. The appearance is compatible with pathologic diagnosis of plasma cell neoplasm could well reflect multiple myeloma. Resulting pathologic fractures of the right lateral clavicle and of multiple bilateral ribs. Lesions are present along the cortical margins of the spinal canal. 2. Both the area and the stomach in the area in the sigmoid colon drawn attention to on prior CT scan appear benign normal today. 3. The confluence of AP window lymph nodes measures about 1.2 cm in short axis with maximum SUV 3.1, which is mildly above the blood pool merit surveillance. 4. Other imaging findings of potential clinical significance: Aortic Atherosclerosis. Coronary atherosclerosis. Pacemaker noted. Borderline prostatomegaly.  Cholelithiasis.  04/21/18 BM Bx revealed Normocellular bone marrow with Plasma Cell neoplasm. Scattered medium sized clusters of kappa-restricted plasma cells (14% aspirate, 10% CD138 immunohistochemistry.   I do suspect that the burden of disease is underestimated in the bone marrow sample given PET/CT findings of innumerable lytic lesions and an M spike of 3.4g   06/08/18 DG Hips bilat which revealed Innumerable lucencies are noted throughout the pelvis and proximal femurs, similar findings noted on prior PET-CT of 04/23/2018. Findings are consistent patient's known multiple myeloma. Femoral necks are intact. 2.  Aortoiliac atherosclerotic vascular disease.  08/06/2021 PET/CT revealed "1. Interval development of consolidative masslike opacities in both upper lobes demonstrating marked hypermetabolism. While these may be infectious/inflammatory, FDG accumulation is higher than typically seen in that setting. Metastatic disease not excluded. 2. Dominant left rib, right scapula, and left sacral lesions all show marked interval decrease in size and hypermetabolism. 3. Soft tissue nodule in the midline back adjacent to the L4 spinous process is similar in size and hypermetabolism today. 4. Interval decrease in size and hypermetabolism in the gastrohepatic lymph node identified previously. 5. Multiple scattered osseous lesions compatible with treated multiple myeloma. 6. Cholelithiasis."   2.  Pulmonary infiltrates likely related to  radiation related change. CT chest was discussed in detail with the patient  PLAN:    -Labs done today discussed in detail with the patient, with stable CBC and CMP. Myeloma labs are stable. -Patient has no acute new symptoms suggestive of myeloma progression at this time -Patient notes no notable toxicities from his current treatment at this time. -continue Daratumumab monthly and lenalidomide 58m 3w on 1w off. With same supportive medications. -orders  placed  Follow-up: Per integrated scheduling orders   The total time spent in the appointment was 30 minutes* .  All of the patient's questions were answered with apparent satisfaction. The patient knows to call the clinic with any problems, questions or concerns.   I,Param Shah,acting as a sEducation administratorfor GSullivan Lone MD.,have documented all relevant documentation on the behalf of GSullivan Lone MD,as directed by  GSullivan Lone MD while in the presence of GSullivan Lone MD.  GSullivan LoneMD MFarmvilleAAHIVMS SSanta [facility] LLCCBeth [facility] De[facility] - West CampusHematology/Oncology Physician CResurgens Surgery [facility] LLC .*Total Encounter Time as defined by the Centers for Medicare and Medicaid Services includes, in addition to the face-to-face time of a patient visit (documented in the note above) non-face-to-face time: obtaining and reviewing outside history, ordering and reviewing medications, tests or procedures, care coordination (communications with other health care professionals or caregivers) and documentation in the medical record.

## 2022-02-27 ENCOUNTER — Encounter: Payer: Self-pay | Admitting: Hematology

## 2022-02-28 ENCOUNTER — Telehealth: Payer: Self-pay | Admitting: *Deleted

## 2022-02-28 NOTE — Patient Outreach (Signed)
  Care Coordination   Initial Visit Note   02/28/2022 Name: Jonathon [name] MRN: [id number] DOB: 08-05-1935  Jonathon [name] is a 86 y.o. year old male who sees Jonathon Ruddy, MD for primary care. I  spoke with daughter Jonathon [name]  What matters to the patients health and wellness today?  No needs    Goals Addressed             This Visit's Progress    COMPLETED: No needs       Care Coordination Interventions: Reviewed scheduled/upcoming provider appointments including pending appointments and verified pt has had his AWV for this year Assessed social determinant of health barriers         SDOH assessments and interventions completed:  Yes  SDOH Interventions Today    Flowsheet Row Most Recent Value  SDOH Interventions   Food Insecurity Interventions Intervention Not Indicated  Housing Interventions Intervention Not Indicated  Utilities Interventions Intervention Not Indicated        Care Coordination Interventions Activated:  Yes  Care Coordination Interventions:  Yes, provided   Follow up plan: No further intervention required.   Encounter Outcome:  Pt. Visit Completed   Jonathon Mina, RN Care Management Coordinator [place] Office [phone]

## 2022-02-28 NOTE — Patient Instructions (Signed)
Visit Information  Thank you for taking time to visit with me today. Please don't hesitate to contact me if I can be of assistance to you.   Following are the goals we discussed today:   Goals Addressed             This Visit's Progress    COMPLETED: No needs       Care Coordination Interventions: Reviewed scheduled/upcoming provider appointments including pending appointments and verified pt has had his AWV for this year Assessed social determinant of health barriers          Please call the care guide team at [phone] if you need to cancel or reschedule your appointment.   If you are experiencing a Mental Health or [place] or need someone to talk to, please call the Suicide and Crisis Lifeline: 988 call the [place] National Suicide Prevention Lifeline: 223-821-5880 or TTY: 435-724-4588 TTY 7341775466) to talk to a trained counselor call 1-800-273-TALK (toll free, 24 hour hotline)  The patient verbalized understanding of instructions, educational materials, and care plan provided today and DECLINED offer to receive copy of patient instructions, educational materials, and care plan.   No further follow up required: No needs presented at this time.  Raina Mina, RN Care Management Coordinator [place] Office [phone]

## 2022-03-08 ENCOUNTER — Other Ambulatory Visit: Payer: Self-pay | Admitting: Hematology

## 2022-03-08 DIAGNOSIS — C9000 Multiple myeloma not having achieved remission: Secondary | ICD-10-CM

## 2022-03-13 DIAGNOSIS — M79674 Pain in right toe(s): Secondary | ICD-10-CM | POA: Diagnosis not present

## 2022-03-13 DIAGNOSIS — B351 Tinea unguium: Secondary | ICD-10-CM | POA: Diagnosis not present

## 2022-03-13 DIAGNOSIS — M79675 Pain in left toe(s): Secondary | ICD-10-CM | POA: Diagnosis not present

## 2022-03-19 ENCOUNTER — Ambulatory Visit: Payer: Medicare Other | Attending: Internal Medicine | Admitting: Internal Medicine

## 2022-03-19 ENCOUNTER — Encounter: Payer: Self-pay | Admitting: Internal Medicine

## 2022-03-19 VITALS — BP 114/70 | HR 67 | Ht 70.0 in | Wt 218.2 lb

## 2022-03-19 DIAGNOSIS — I471 Supraventricular tachycardia, unspecified: Secondary | ICD-10-CM | POA: Diagnosis not present

## 2022-03-19 DIAGNOSIS — Z950 Presence of cardiac pacemaker: Secondary | ICD-10-CM | POA: Insufficient documentation

## 2022-03-19 DIAGNOSIS — I480 Paroxysmal atrial fibrillation: Secondary | ICD-10-CM | POA: Insufficient documentation

## 2022-03-19 DIAGNOSIS — R001 Bradycardia, unspecified: Secondary | ICD-10-CM | POA: Insufficient documentation

## 2022-03-19 LAB — CUP PACEART INCLINIC DEVICE CHECK
Battery Impedance: 1897 Ohm
Battery Remaining Longevity: 34 mo
Battery Voltage: 2.74 V
Brady Statistic AP VP Percent: 69 %
Brady Statistic AP VS Percent: 0 %
Brady Statistic AS VP Percent: 31 %
Brady Statistic AS VS Percent: 0 %
Date Time Interrogation Session: 20231107111205
Implantable Lead Connection Status: 753985
Implantable Lead Connection Status: 753985
Implantable Lead Implant Date: 20050121
Implantable Lead Implant Date: 20050121
Implantable Lead Location: 753859
Implantable Lead Location: 753860
Implantable Lead Model: 5076
Implantable Lead Model: 5076
Implantable Pulse Generator Implant Date: 20140207
Lead Channel Impedance Value: 490 Ohm
Lead Channel Impedance Value: 537 Ohm
Lead Channel Pacing Threshold Amplitude: 0.625 V
Lead Channel Pacing Threshold Amplitude: 1 V
Lead Channel Pacing Threshold Amplitude: 1.125 V
Lead Channel Pacing Threshold Pulse Width: 0.4 ms
Lead Channel Pacing Threshold Pulse Width: 0.4 ms
Lead Channel Pacing Threshold Pulse Width: 0.4 ms
Lead Channel Sensing Intrinsic Amplitude: 2 mV
Lead Channel Sensing Intrinsic Amplitude: 4 mV
Lead Channel Setting Pacing Amplitude: 2.25 V
Lead Channel Setting Pacing Amplitude: 2.5 V
Lead Channel Setting Pacing Pulse Width: 0.4 ms
Lead Channel Setting Sensing Sensitivity: 2 mV
Zone Setting Status: 755011
Zone Setting Status: 755011

## 2022-03-19 NOTE — Progress Notes (Signed)
HPI  [name] returns today for followup. He is a pleasant 86 yo man with a h/o sinus node dysfunction, PAF/AT and HTN, s/p PPM insertion. He has CIDP and recently diagnosed with multiple myeloma and is on immune therapy. Despite his serious medical problems he continues to maintain a very positive attitude. He had gained weight but then lost some. He has not had chest pain or sob. No edema. he was diagnosed with atrial fib with a CVR. I started him on flecainide. He has done well.     Current Outpatient Medications  Medication Sig Dispense Refill   apixaban (ELIQUIS) 5 MG TABS tablet Take 1 tablet (5 mg total) by mouth 2 (two) times daily. 60 tablet 11   colchicine 0.6 MG tablet Take 2 tabs (1.2 mg) now.  Then take 1 tab (0.6 mg) 1 hour later.  Then take 1 tab daily until gout flare resolves. 14 tablet 0   diltiazem (TIAZAC) 240 MG 24 hr capsule Take 1 capsule by mouth daily.     flecainide (TAMBOCOR) 150 MG tablet Take 0.5 tablets (75 mg total) by mouth 2 (two) times daily. 90 tablet 3   furosemide (LASIX) 40 MG tablet Take 40 mg by mouth daily.     irbesartan (AVAPRO) 300 MG tablet Take 300 mg by mouth daily.     lenalidomide (REVLIMID) 5 MG capsule TAKE 1 CAPSULE BY MOUTH 1 TIME A DAY 21 capsule 0   metoprolol tartrate (LOPRESSOR) 50 MG tablet Take 1 tablet (50 mg total) by mouth 2 (two) times daily. 60 tablet 0   polyethylene glycol (MIRALAX / GLYCOLAX) packet Take 17 g by mouth daily. 14 each 0   senna-docusate (SENOKOT-S) 8.6-50 MG tablet Take 2 tablets by mouth 2 (two) times daily as needed for mild constipation. 30 tablet 0   tamsulosin (FLOMAX) 0.4 MG CAPS capsule Take 2 capsules (0.8 mg total) by mouth daily. 90 capsule 0   traMADol (ULTRAM) 50 MG tablet Take 1 tablet (50 mg total) by mouth every 6 (six) hours as needed. 30 tablet 0   No current facility-administered medications for this visit.   Facility-Administered Medications Ordered in Other Visits  Medication Dose  Route Frequency Provider Last Rate Last Admin   [MAR Hold] sodium chloride flush (NS) 0.9 % injection 10 mL  10 mL Intracatheter PRN Brunetta Genera, MD         Past Medical History:  Diagnosis Date   Asthma    BENIGN PROSTATIC HYPERTROPHY, HX OF 10/25/2008   Bone metastases 04/14/2018   BRADYCARDIA 2005   Chronic diastolic congestive heart failure ([place]) 06/05/2016   CIDP (chronic inflammatory demyelinating polyneuropathy) ([place])    HYPERTENSION 11/21/2006   Iron deficiency anemia, unspecified 04/19/2013   Multiple myeloma ([place]) 04/29/2018   NEPHROLITHIASIS, HX OF 11/21/2006 and 10/15/2008   PACEMAKER, PERMANENT 2005   Gen change 2014 Medtronic Adaptic L dual-chamber pacemaker, serial #[id number]    SVT (supraventricular tachycardia)    TOBACCO ABUSE 10/25/2008   Quit 2012    ROS:   All systems reviewed and negative except as noted in the HPI.   Past Surgical History:  Procedure Laterality Date   COLONOSCOPY  2004,2009   PACEMAKER INSERTION  05/14/2003   PERMANENT PACEMAKER GENERATOR CHANGE N/A 06/19/2012   Procedure: PERMANENT PACEMAKER GENERATOR CHANGE;  Surgeon: [name], MD; Medtronic Adaptic L dual-chamber pacemaker, serial #[id number]     SKIN GRAFT Right 05/13/1960   wrist   TOOTH  EXTRACTION  11/10/2020   3 teeth removed     Family History  Problem Relation Age of Onset   Heart attack Father        Died, 32   Kidney disease Father    Parkinson's disease Mother        Died, 54   Breast cancer Sister        Living, 55   Diabetes type II Brother        Living, 18   Breast cancer Sister        Living, 25   Diabetes Daughter        Living, 68   Diabetes Son        Living, 93   Ovarian cancer Daughter    Colon cancer Neg Hx    Rectal cancer Neg Hx    Stomach cancer Neg Hx      Social History   Socioeconomic History   Marital status: Divorced    Spouse name: Not on file   Number of children: 4   Years of education: Not on file   Highest  education level: Not on file  Occupational History   Occupation: ENVIROMENTAL SERVICE    Employer: [employer]   Occupation: retired  Tobacco Use   Smoking status: Former    Packs/day: 0.25    Years: 46.00    Total pack years: 11.50    Types: Cigarettes    Start date: 03/16/1965    Quit date: 06/15/2010    Years since quitting: 11.7   Smokeless tobacco: Never   Tobacco comments:    Former smoker 10/11/21 quit 3 years ago  Vaping Use   Vaping Use: Never used  Substance and Sexual Activity   Alcohol use: Yes    Alcohol/week: 6.0 - 10.0 standard drinks of alcohol    Types: 6 - 10 Glasses of wine per week    Comment: 1-2 glasses every weekend 10/11/21   Drug use: No   Sexual activity: Not on file  Other Topics Concern   Not on file  Social History Narrative   Has relocated from [place] in 2007. 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  Patient in a facility thru [facility]   Social Determinants of Health   Financial Resource Strain: Low Risk  (02/07/2022)   Overall Financial Resource Strain (CARDIA)    Difficulty of Paying Living Expenses: Not hard at all  Food Insecurity: No Food Insecurity (02/28/2022)   Hunger Vital Sign    Worried About Running Out of Food in the Last Year: Never true    Ran Out of Food in the Last Year: Never true  Transportation Needs: No Transportation Needs (02/07/2022)   PRAPARE - Hydrologist (Medical): No    Lack of Transportation (Non-Medical): No  Physical Activity: Sufficiently Active (02/07/2022)   Exercise Vital Sign    Days of Exercise per Week: 7 days    Minutes of Exercise per Session: 50 min  Stress: No Stress Concern Present (02/07/2022)   [place]    Feeling of Stress : Not at all  Social Connections: Socially Isolated (02/07/2022)   Social Connection and Isolation Panel [NHANES]    Frequency of Communication with Friends and Family: More than three times  a week    Frequency of Social Gatherings with Friends and Family: More than three times a week    Attends Religious Services: Never    Active Member of  Clubs or Organizations: No    Attends Archivist Meetings: Never    Marital Status: Divorced  Human resources officer Violence: Not At Risk (02/07/2022)   Humiliation, Afraid, Rape, and Kick questionnaire    Fear of Current or Ex-Partner: No    Emotionally Abused: No    Physically Abused: No    Sexually Abused: No     BP 114/70   Pulse 67   Ht _0  (1.778 m)   Wt 218 lb 3.2 oz (99 kg)   SpO2 98%   BMI 31.31 kg/m   Physical Exam:  Well appearing NAD HEENT: Unremarkable Neck:  No JVD, no thyromegally Lymphatics:  No adenopathy Back:  No CVA tenderness Lungs:  Clear with no wheezes HEART:  Regular rate rhythm, no murmurs, no rubs, no clicks Abd:  soft, positive bowel sounds, no organomegally, no rebound, no guarding Ext:  2 plus pulses, no edema, no cyanosis, no clubbing Skin:  No rashes no nodules Neuro:  CN II through XII intact, motor grossly intact  EKG - nsr with ventricular pacing  DEVICE  Normal device function.  See PaceArt for details.   Assess/Plan: PAF - he is maintaining NSR on flecainide. Continue.  Coags - he has started eliquis and is tolerating well.  HTN - his bp is well controlled.  PPM - his medtronic DDD PM is working normally. We will follow.  Carleene Overlie Jasie Meleski,MD

## 2022-03-19 NOTE — Patient Instructions (Signed)
Medication Instructions:  Your physician recommends that you continue on your current medications as directed. Please refer to the Current Medication list given to you today.  *If you need a refill on your cardiac medications before your next appointment, please call your pharmacy*  Lab Work: None ordered.  If you have labs (blood work) drawn today and your tests are completely normal, you will receive your results only by: [place] (if you have MyChart) OR A paper copy in the mail If you have any lab test that is abnormal or we need to change your treatment, we will call you to review the results.  Testing/Procedures: None ordered.  Follow-Up: At [facility], you and your health needs are our priority.  As part of our continuing mission to provide you with exceptional heart care, we have created designated Provider Care Teams.  These Care Teams include your primary Cardiologist (physician) and Advanced Practice Providers (APPs -  Physician Assistants and Nurse Practitioners) who all work together to provide you with the care you need, when you need it.  We recommend signing up for the patient portal called "MyChart".  Sign up information is provided on this After Visit Summary.  MyChart is used to connect with patients for Virtual Visits (Telemedicine).  Patients are able to view lab/test results, encounter notes, upcoming appointments, etc.  Non-urgent messages can be sent to your provider as well.   To learn more about what you can do with MyChart, go to [email or web address].    Your next appointment:   1 year(s)  The format for your next appointment:   In Person  Provider:   Cristopher Peru, MD{or one of the following Advanced Practice Providers on your designated Care Team:   Tommye Standard, [place] Legrand Como "Jonni Sanger" Chalmers Cater, [place]  Remote monitoring is used to monitor your Pacemaker/ ICD from home. This monitoring reduces the number of office visits required to check your  device to one time per year. It allows [place] to keep an eye on the functioning of your device to ensure it is working properly. You are scheduled for a device check from home on 04/03/22. You may send your transmission at any time that day. If you have a wireless device, the transmission will be sent automatically. After your physician reviews your transmission, you will receive a postcard with your next transmission date.  Important Information About [place]

## 2022-03-20 ENCOUNTER — Inpatient Hospital Stay: Payer: Medicare Other

## 2022-03-20 ENCOUNTER — Inpatient Hospital Stay: Payer: Medicare Other | Attending: Hematology

## 2022-03-20 ENCOUNTER — Inpatient Hospital Stay (HOSPITAL_BASED_OUTPATIENT_CLINIC_OR_DEPARTMENT_OTHER): Payer: Medicare Other | Admitting: Physician Assistant

## 2022-03-20 ENCOUNTER — Other Ambulatory Visit: Payer: Self-pay

## 2022-03-20 VITALS — BP 138/72 | HR 63 | Temp 97.9°F | Resp 15

## 2022-03-20 DIAGNOSIS — C9002 Multiple myeloma in relapse: Secondary | ICD-10-CM | POA: Diagnosis not present

## 2022-03-20 DIAGNOSIS — Z5112 Encounter for antineoplastic immunotherapy: Secondary | ICD-10-CM | POA: Diagnosis not present

## 2022-03-20 DIAGNOSIS — Z7189 Other specified counseling: Secondary | ICD-10-CM

## 2022-03-20 DIAGNOSIS — C7951 Secondary malignant neoplasm of bone: Secondary | ICD-10-CM | POA: Insufficient documentation

## 2022-03-20 DIAGNOSIS — C9001 Multiple myeloma in remission: Secondary | ICD-10-CM

## 2022-03-20 LAB — CBC WITH DIFFERENTIAL (CANCER CENTER ONLY)
Abs Immature Granulocytes: 0.01 10*3/uL (ref 0.00–0.07)
Basophils Absolute: 0.1 10*3/uL (ref 0.0–0.1)
Basophils Relative: 2 %
Eosinophils Absolute: 0.3 10*3/uL (ref 0.0–0.5)
Eosinophils Relative: 10 %
HCT: 33.9 % — ABNORMAL LOW (ref 39.0–52.0)
Hemoglobin: 11.6 g/dL — ABNORMAL LOW (ref 13.0–17.0)
Immature Granulocytes: 0 %
Lymphocytes Relative: 36 %
Lymphs Abs: 1.2 10*3/uL (ref 0.7–4.0)
MCH: 29.4 pg (ref 26.0–34.0)
MCHC: 34.2 g/dL (ref 30.0–36.0)
MCV: 86 fL (ref 80.0–100.0)
Monocytes Absolute: 0.5 10*3/uL (ref 0.1–1.0)
Monocytes Relative: 16 %
Neutro Abs: 1.2 10*3/uL — ABNORMAL LOW (ref 1.7–7.7)
Neutrophils Relative %: 36 %
Platelet Count: 149 10*3/uL — ABNORMAL LOW (ref 150–400)
RBC: 3.94 MIL/uL — ABNORMAL LOW (ref 4.22–5.81)
RDW: 14.6 % (ref 11.5–15.5)
WBC Count: 3.3 10*3/uL — ABNORMAL LOW (ref 4.0–10.5)
nRBC: 0 % (ref 0.0–0.2)

## 2022-03-20 LAB — CMP (CANCER CENTER ONLY)
ALT: 9 U/L (ref 0–44)
AST: 15 U/L (ref 15–41)
Albumin: 3.7 g/dL (ref 3.5–5.0)
Alkaline Phosphatase: 149 U/L — ABNORMAL HIGH (ref 38–126)
Anion gap: 6 (ref 5–15)
BUN: 16 mg/dL (ref 8–23)
CO2: 27 mmol/L (ref 22–32)
Calcium: 9.3 mg/dL (ref 8.9–10.3)
Chloride: 106 mmol/L (ref 98–111)
Creatinine: 1.29 mg/dL — ABNORMAL HIGH (ref 0.61–1.24)
GFR, Estimated: 55 mL/min — ABNORMAL LOW (ref >60)
Glucose, Bld: 104 mg/dL — ABNORMAL HIGH (ref 70–99)
Potassium: 4.2 mmol/L (ref 3.5–5.1)
Sodium: 139 mmol/L (ref 135–145)
Total Bilirubin: 0.5 mg/dL (ref 0.3–1.2)
Total Protein: 6.6 g/dL (ref 6.5–8.1)

## 2022-03-20 MED ORDER — FAMOTIDINE IN NACL 20-0.9 MG/50ML-% IV SOLN
20.0000 mg | Freq: Once | INTRAVENOUS | Status: AC
  Administered 2022-03-20: 20 mg via INTRAVENOUS
  Filled 2022-03-20: qty 50

## 2022-03-20 MED ORDER — ACETAMINOPHEN 325 MG PO TABS
650.0000 mg | ORAL_TABLET | Freq: Once | ORAL | Status: AC
  Administered 2022-03-20: 650 mg via ORAL
  Filled 2022-03-20: qty 2

## 2022-03-20 MED ORDER — SODIUM CHLORIDE 0.9 % IV SOLN: Freq: Once | INTRAVENOUS | Status: AC

## 2022-03-20 MED ORDER — DEXAMETHASONE 4 MG PO TABS
20.0000 mg | ORAL_TABLET | Freq: Once | ORAL | Status: AC
  Administered 2022-03-20: 20 mg via ORAL
  Filled 2022-03-20: qty 5

## 2022-03-20 MED ORDER — DIPHENHYDRAMINE HCL 25 MG PO CAPS
50.0000 mg | ORAL_CAPSULE | Freq: Once | ORAL | Status: AC
  Administered 2022-03-20: 50 mg via ORAL
  Filled 2022-03-20: qty 2

## 2022-03-20 MED ORDER — SODIUM CHLORIDE 0.9 % IV SOLN
16.0000 mg/kg | Freq: Once | INTRAVENOUS | Status: AC
  Administered 2022-03-20: 1500 mg via INTRAVENOUS
  Filled 2022-03-20: qty 60

## 2022-03-20 NOTE — Progress Notes (Signed)
Per Jonathon [name], OK to trt with ANC 1.2 K/uL today

## 2022-03-20 NOTE — Patient Instructions (Signed)
[facility]  Discharge Instructions: Thank you for choosing [facility] to provide your oncology and hematology care.   If you have a lab appointment with the [facility], please go directly to the [facility] and check in at the registration area.   Wear comfortable clothing and clothing appropriate for easy access to any Portacath or PICC line.   We strive to give you quality time with your provider. You may need to reschedule your appointment if you arrive late (15 or more minutes).  Arriving late affects you and other patients whose appointments are after yours.  Also, if you miss three or more appointments without notifying the office, you may be dismissed from the clinic at the provider's discretion.      For prescription refill requests, have your pharmacy contact our office and allow 72 hours for refills to be completed.    Today you received the following chemotherapy and/or immunotherapy agents: Darzalex      To help prevent nausea and vomiting after your treatment, we encourage you to take your nausea medication as directed.  BELOW ARE SYMPTOMS THAT SHOULD BE REPORTED IMMEDIATELY: *FEVER GREATER THAN 100.4 F (38 C) OR HIGHER *CHILLS OR SWEATING *NAUSEA AND VOMITING THAT IS NOT CONTROLLED WITH YOUR NAUSEA MEDICATION *UNUSUAL SHORTNESS OF BREATH *UNUSUAL BRUISING OR BLEEDING *URINARY PROBLEMS (pain or burning when urinating, or frequent urination) *BOWEL PROBLEMS (unusual diarrhea, constipation, pain near the anus) TENDERNESS IN MOUTH AND THROAT WITH OR WITHOUT PRESENCE OF ULCERS (sore throat, sores in mouth, or a toothache) UNUSUAL RASH, SWELLING OR PAIN  UNUSUAL VAGINAL DISCHARGE OR ITCHING   Items with * indicate a potential emergency and should be followed up as soon as possible or go to the Emergency Department if any problems should occur.  Please show the CHEMOTHERAPY ALERT CARD or IMMUNOTHERAPY ALERT CARD at check-in to  the Emergency Department and triage nurse.  Should you have questions after your visit or need to cancel or reschedule your appointment, please contact [facility]  Dept: [phone]  and follow the prompts.  Office hours are 8:00 a.m. to 4:30 p.m. Monday - Friday. Please note that voicemails left after 4:00 p.m. may not be returned until the following business day.  We are closed weekends and major holidays. You have access to a nurse at all times for urgent questions. Please call the main number to the clinic Dept: [phone] and follow the prompts.   For any non-urgent questions, you may also contact your provider using MyChart. We now offer e-Visits for anyone 60 and older to request care online for non-urgent symptoms. For details visit [email or web address].   Also download the MyChart app! Go to the app store, search "MyChart", open the app, select [facility], and log in with your MyChart username and password.  Masks are optional in the cancer centers. If you would like for your care team to wear a mask while they are taking care of you, please let them know. You may have one support person who is at least 84 years old accompany you for your appointments.

## 2022-03-20 NOTE — Progress Notes (Signed)
HEMATOLOGY/ONCOLOGY CLINIC NOTE  Date of Service: 03/20/22    Patient Care Team: [name], MD as PCP - General (Family Medicine) [name], MD (Cardiology) [name], MD (Cardiology) [name], DO as Consulting Physician (Neurology)  CHIEF COMPLAINTS/PURPOSE OF CONSULTATION:  Follow-up for continued evaluation and management of multiple myeloma  HISTORY OF PRESENTING ILLNESS:  Please see previous note for details on initial presentation.  INTERVAL HISTORY:   Jonathon [name] is a 86 y.o. male here for continued evaluation and management of multiple myeloma. He was last seen by Dr.  Limbo on 02/20/2022. In the interim, Mr. Mikesell denies any changes to his health. He reports stable energy levels. He continues to exercise daily and complete his daily activities. He has a good appetite and denies any weight loss. He denies nausea, vomiting or abdominal pain. His bowel habits are unchanged without any recurrent episodes of diarrhea or constipation. He denies easy bruising or signs of active bleeding. He has stable neuropathy involving his hands and feet. He denies fevers, chills, sweats, shortness of breath, chest pain or cough. He has no other complaints.     MEDICAL HISTORY:  Past Medical History:  Diagnosis Date   Asthma    BENIGN PROSTATIC HYPERTROPHY, HX OF 10/25/2008   Bone metastases 04/14/2018   BRADYCARDIA 2005   Chronic diastolic congestive heart failure ([place]) 06/05/2016   CIDP (chronic inflammatory demyelinating polyneuropathy) ([place])    HYPERTENSION 11/21/2006   Iron deficiency anemia, unspecified 04/19/2013   Multiple myeloma ([place]) 04/29/2018   NEPHROLITHIASIS, HX OF 11/21/2006 and 10/15/2008   PACEMAKER, PERMANENT 2005   Gen change 2014 Medtronic Adaptic L dual-chamber pacemaker, serial #[id number]    SVT (supraventricular tachycardia)    TOBACCO ABUSE 10/25/2008   Quit 2012    SURGICAL HISTORY: Past Surgical History:  Procedure Laterality Date    COLONOSCOPY  2004,2009   PACEMAKER INSERTION  05/14/2003   PERMANENT PACEMAKER GENERATOR CHANGE N/A 06/19/2012   Procedure: PERMANENT PACEMAKER GENERATOR CHANGE;  Surgeon: [name], MD; Medtronic Adaptic L dual-chamber pacemaker, serial #[id number]     SKIN GRAFT Right 05/13/1960   wrist   TOOTH EXTRACTION  11/10/2020   3 teeth removed    SOCIAL HISTORY: Social History   Socioeconomic History   Marital status: Divorced    Spouse name: Not on file   Number of children: 4   Years of education: Not on file   Highest education level: Not on file  Occupational History   Occupation: ENVIROMENTAL SERVICE    Employer: [employer]   Occupation: retired  Tobacco Use   Smoking status: Former    Packs/day: 0.25    Years: 46.00    Total pack years: 11.50    Types: Cigarettes    Start date: 03/16/1965    Quit date: 06/15/2010    Years since quitting: 11.7   Smokeless tobacco: Never   Tobacco comments:    Former smoker 10/11/21 quit 3 years ago  Vaping Use   Vaping Use: Never used  Substance and Sexual Activity   Alcohol use: Yes    Alcohol/week: 6.0 - 10.0 standard drinks of alcohol    Types: 6 - 10 Glasses of wine per week    Comment: 1-2 glasses every weekend 10/11/21   Drug use: No   Sexual activity: Not on file  Other Topics Concern   Not on file  Social History Narrative   Has relocated from [place] in 2007. 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  Patient in a facility thru [facility]   Social Determinants of Health   Financial Resource Strain: Low Risk  (02/07/2022)   Overall Financial Resource Strain (CARDIA)    Difficulty of Paying Living Expenses: Not hard at all  Food Insecurity: No Food Insecurity (02/28/2022)   Hunger Vital Sign    Worried About Running Out of Food in the Last Year: Never true    Ran Out of Food in the Last Year: Never true  Transportation Needs: No Transportation Needs (02/07/2022)   PRAPARE - Hydrologist (Medical): No     Lack of Transportation (Non-Medical): No  Physical Activity: Sufficiently Active (02/07/2022)   Exercise Vital Sign    Days of Exercise per Week: 7 days    Minutes of Exercise per Session: 50 min  Stress: No Stress Concern Present (02/07/2022)   [place]    Feeling of Stress : Not at all  Social Connections: Socially Isolated (02/07/2022)   Social Connection and Isolation Panel [NHANES]    Frequency of Communication with Friends and Family: More than three times a week    Frequency of Social Gatherings with Friends and Family: More than three times a week    Attends Religious Services: Never    Marine scientist or Organizations: No    Attends Archivist Meetings: Never    Marital Status: Divorced  Human resources officer Violence: Not At Risk (02/07/2022)   Humiliation, Afraid, Rape, and Kick questionnaire    Fear of Current or Ex-Partner: No    Emotionally Abused: No    Physically Abused: No    Sexually Abused: No    FAMILY HISTORY: Family History  Problem Relation Age of Onset   Heart attack Father        Died, 29   Kidney disease Father    Parkinson's disease Mother        Died, 5   Breast cancer Sister        Living, 20   Diabetes type II Brother        Living, 42   Breast cancer Sister        Living, 8   Diabetes Daughter        Living, 77   Diabetes Son        Living, 38   Ovarian cancer Daughter    Colon cancer Neg Hx    Rectal cancer Neg Hx    Stomach cancer Neg Hx     ALLERGIES:  is allergic to lexapro [escitalopram oxalate].  MEDICATIONS:  Current Outpatient Medications  Medication Sig Dispense Refill   apixaban (ELIQUIS) 5 MG TABS tablet Take 1 tablet (5 mg total) by mouth 2 (two) times daily. 60 tablet 11   colchicine 0.6 MG tablet Take 2 tabs (1.2 mg) now.  Then take 1 tab (0.6 mg) 1 hour later.  Then take 1 tab daily until gout flare resolves. 14 tablet 0   diltiazem  (TIAZAC) 240 MG 24 hr capsule Take 1 capsule by mouth daily.     flecainide (TAMBOCOR) 150 MG tablet Take 0.5 tablets (75 mg total) by mouth 2 (two) times daily. 90 tablet 3   furosemide (LASIX) 40 MG tablet Take 40 mg by mouth daily.     irbesartan (AVAPRO) 300 MG tablet Take 300 mg by mouth daily.     lenalidomide (REVLIMID) 5 MG capsule TAKE 1 CAPSULE BY MOUTH 1 TIME A DAY 21  capsule 0   metoprolol tartrate (LOPRESSOR) 50 MG tablet Take 1 tablet (50 mg total) by mouth 2 (two) times daily. 60 tablet 0   polyethylene glycol (MIRALAX / GLYCOLAX) packet Take 17 g by mouth daily. 14 each 0   senna-docusate (SENOKOT-S) 8.6-50 MG tablet Take 2 tablets by mouth 2 (two) times daily as needed for mild constipation. 30 tablet 0   tamsulosin (FLOMAX) 0.4 MG CAPS capsule Take 2 capsules (0.8 mg total) by mouth daily. 90 capsule 0   traMADol (ULTRAM) 50 MG tablet Take 1 tablet (50 mg total) by mouth every 6 (six) hours as needed. 30 tablet 0   No current facility-administered medications for this visit.   Facility-Administered Medications Ordered in Other Visits  Medication Dose Route Frequency Provider Last Rate Last Admin   [MAR Hold] sodium chloride flush (NS) 0.9 % injection 10 mL  10 mL Intracatheter PRN Brunetta Genera, MD        REVIEW OF SYSTEMS:   10 Point review of Systems was done is negative except as noted above.  PHYSICAL EXAMINATION: ECOG PERFORMANCE STATUS: 1 - Symptomatic but completely ambulatory  NAD GENERAL:alert, in no acute distress and comfortable SKIN: no acute rashes, no significant lesions EYES: conjunctiva are pink and non-injected, sclera anicteric NECK: supple, no JVD LYMPH:  no palpable lymphadenopathy in the cervical regions LUNGS: clear to auscultation b/l with normal respiratory effort HEART: regular rate & rhythm Extremity: no pedal edema PSYCH: alert & oriented x 3 with fluent speech NEURO: no focal motor/sensory deficits  LABORATORY DATA:  I have  reviewed the data as listed .    Latest Ref Rng & Units 03/20/2022    9:42 AM 02/20/2022   10:17 AM 01/23/2022   10:11 AM  CBC  WBC 4.0 - 10.5 K/uL 3.3  3.9  3.4   Hemoglobin 13.0 - 17.0 g/dL 11.6  12.0  11.7   Hematocrit 39.0 - 52.0 % 33.9  35.9  34.6   Platelets 150 - 400 K/uL 149  132  124        Latest Ref Rng & Units 03/20/2022    9:45 AM 01/23/2022   10:11 AM 12/25/2021    9:00 AM  CMP  Glucose 70 - 99 mg/dL 104  119  118   BUN 8 - 23 mg/dL _0 Creatinine 0.61 - 1.24 mg/dL 1.29  1.21  1.31   Sodium 135 - 145 mmol/L 139  138  139   Potassium 3.5 - 5.1 mmol/L 4.2  3.9  3.8   Chloride 98 - 111 mmol/L 106  105  108   CO2 22 - 32 mmol/L _1 Calcium 8.9 - 10.3 mg/dL 9.3  9.4  9.5   Total Protein 6.5 - 8.1 g/dL 6.6  6.3  6.5   Total Bilirubin 0.3 - 1.2 mg/dL 0.5  0.5  0.7   Alkaline Phos 38 - 126 U/L 149  143  146   AST 15 - 41 U/L _2 ALT 0 - 44 U/L _3 04/17/18 BM Bx:    RADIOGRAPHIC STUDIES: I have personally reviewed the radiological images as listed and agreed with the findings in the report. CUP PACEART INCLINIC DEVICE CHECK  Result Date: 03/19/2022 Pacemaker check in clinic. Normal device function. Thresholds, sensing, impedances consistent with previous measurements. Device programmed to maximize longevity. No  mode switch or high ventricular rates noted. Device programmed at appropriate safety margins. Histogram distribution appropriate for patient activity level. Device programmed to optimize intrinsic conduction. Estimated longevity 3 years. Patient enrolled in remote follow-up. Patient education completed.Myrtie Hawk, BSN, RN   ASSESSMENT & PLAN:   86 y.o. male with  1.  Relapsed multiple myeloma multiple bone metastases   labs upon initial presentation from 04/14/18; hypercalcemic with Calcium at 13.8, renal function up form baseline with Creatinine at 1.48, HGB at 11.3. PSA was normal.   03/31/18 CT C/A/P revealed 1.  Lytic lesion throughout all visualized vertebra from the lower cervical spine through the sacrum. Epidural extension of tumor T2, T3 and L2 level. Right L3-4 foraminal extension tumor. 2. Lytic lesions involving majority of ribs. Multiple pathologic rib fractures with bony expansion/sub pleural extension of tumor at several levels. 3. Lytic lesions of the scapula, hips bilaterally and pelvis bilaterally. With further progression of tumor, patient may be at risk for pathologic fractures. Pathologic fracture right clavicle incompletely assessed. 4. Circumferential narrowing sigmoid colon. Question possibility of underlying mass (series 2, image 97). Alternatively this could represent muscular hypertrophy/peristalsis. 5. Gastric antrum circumferential narrowing. Cannot exclude mass although this may be related to peristalsis. 6. No primary lung malignancy noted. 7. Matted aortic pulmonary window lymph nodes. 8. Circumferential urinary bladder wall thickening greater anterior dome region. Right lateral bladder diverticulum. 9. 5 mm low-density lesion pancreatic body (series 5, image 10) unchanged. This is felt unlikely to be a primary malignancy. 10. Right parapelvic 2.2 cm cyst without significant change. Scattered low-density renal lesions bilaterally too small to adequately characterize. Some were present previously. Statistically these are likely cysts. 11. Stable adrenal gland hyperplasia. 12. Gynecomastia. 13. Prostate gland causes slight impression upon the bladder base. 14.  Aortic Atherosclerosis. 15. Sequential pacemaker in place. 16. Gallstones.   04/23/18 PET/CT revealed Innumerable hypermetabolic lytic lesions throughout the skeleton especially involving the spine, ribs, bony pelvis along with some involvement of both proximal femurs. The appearance is compatible with pathologic diagnosis of plasma cell neoplasm could well reflect multiple myeloma. Resulting pathologic fractures of the right lateral  clavicle and of multiple bilateral ribs. Lesions are present along the cortical margins of the spinal canal. 2. Both the area and the stomach in the area in the sigmoid colon drawn attention to on prior CT scan appear benign normal today. 3. The confluence of AP window lymph nodes measures about 1.2 cm in short axis with maximum SUV 3.1, which is mildly above the blood pool merit surveillance. 4. Other imaging findings of potential clinical significance: Aortic Atherosclerosis. Coronary atherosclerosis. Pacemaker noted. Borderline prostatomegaly. Cholelithiasis.  04/21/18 BM Bx revealed Normocellular bone marrow with Plasma Cell neoplasm. Scattered medium sized clusters of kappa-restricted plasma cells (14% aspirate, 10% CD138 immunohistochemistry.   I do suspect that the burden of disease is underestimated in the bone marrow sample given PET/CT findings of innumerable lytic lesions and an M spike of 3.4g   06/08/18 DG Hips bilat which revealed Innumerable lucencies are noted throughout the pelvis and proximal femurs, similar findings noted on prior PET-CT of 04/23/2018. Findings are consistent patient's known multiple myeloma. Femoral necks are intact. 2.  Aortoiliac atherosclerotic vascular disease.  08/06/2021 PET/CT revealed "1. Interval development of consolidative masslike opacities in both upper lobes demonstrating marked hypermetabolism. While these may be infectious/inflammatory, FDG accumulation is higher than typically seen in that setting. Metastatic disease not excluded. 2. Dominant left rib, right scapula, and left sacral lesions all show  marked interval decrease in size and hypermetabolism. 3. Soft tissue nodule in the midline back adjacent to the L4 spinous process is similar in size and hypermetabolism today. 4. Interval decrease in size and hypermetabolism in the gastrohepatic lymph node identified previously. 5. Multiple scattered osseous lesions compatible with treated multiple  myeloma. 6. Cholelithiasis."   2.  Pulmonary infiltrates  --Likely related to radiation related change.  PLAN:    --Due for Cycle 2, Day 1 of daratumumab --Labs from today reviewed and adequate for treatment. WBC 3.3, Hgb 11.6, Plt 149. Creatinine 1.29, Calcium 9.3. Normal LFTs. MM panel pending today.  --No prohibitive toxicities noted.  -Proceed with treatment today and continue Revlimid 5 mg, 3  weeks on and 1 week off --RTC in 4 weeks with labs, f/u visit with Dr. Shelbe Haglund Limbo and next daratumumab treatment   All of the patient's questions were answered with apparent satisfaction. The patient knows to call the clinic with any problems, questions or concerns.  I have spent a total of 30 minutes minutes of face-to-face and non-face-to-face time, preparing to see the patient,  performing a medically appropriate examination, counseling and educating the patient,documenting clinical information in the electronic health record, and care coordination.   Dede Query PA-C Dept of Hematology and [facility] at [facility] Phone: [phone]

## 2022-03-25 LAB — MULTIPLE MYELOMA PANEL, SERUM
Albumin SerPl Elph-Mcnc: 3.1 g/dL (ref 2.9–4.4)
Albumin/Glob SerPl: 1.2 (ref 0.7–1.7)
Alpha 1: 0.3 g/dL (ref 0.0–0.4)
Alpha2 Glob SerPl Elph-Mcnc: 0.9 g/dL (ref 0.4–1.0)
B-Globulin SerPl Elph-Mcnc: 1.2 g/dL (ref 0.7–1.3)
Gamma Glob SerPl Elph-Mcnc: 0.5 g/dL (ref 0.4–1.8)
Globulin, Total: 2.8 g/dL (ref 2.2–3.9)
IgA: 290 mg/dL (ref 61–437)
IgG (Immunoglobin G), Serum: 596 mg/dL — ABNORMAL LOW (ref 603–1613)
IgM (Immunoglobulin M), Srm: 41 mg/dL (ref 15–143)
Total Protein ELP: 5.9 g/dL — ABNORMAL LOW (ref 6.0–8.5)

## 2022-04-02 ENCOUNTER — Other Ambulatory Visit: Payer: Self-pay | Admitting: Hematology

## 2022-04-02 DIAGNOSIS — C9000 Multiple myeloma not having achieved remission: Secondary | ICD-10-CM

## 2022-04-03 ENCOUNTER — Encounter: Payer: Self-pay | Admitting: Hematology

## 2022-04-03 ENCOUNTER — Other Ambulatory Visit: Payer: Self-pay

## 2022-04-17 ENCOUNTER — Inpatient Hospital Stay (HOSPITAL_BASED_OUTPATIENT_CLINIC_OR_DEPARTMENT_OTHER): Payer: Medicare Other | Admitting: Hematology

## 2022-04-17 ENCOUNTER — Other Ambulatory Visit: Payer: Self-pay

## 2022-04-17 ENCOUNTER — Inpatient Hospital Stay: Payer: Medicare Other | Attending: Hematology

## 2022-04-17 ENCOUNTER — Inpatient Hospital Stay: Payer: Medicare Other

## 2022-04-17 VITALS — BP 134/70 | HR 60 | Temp 98.0°F | Resp 18

## 2022-04-17 VITALS — BP 134/77 | HR 78 | Temp 97.8°F | Resp 19 | Ht 70.0 in | Wt 215.3 lb

## 2022-04-17 DIAGNOSIS — Z7189 Other specified counseling: Secondary | ICD-10-CM | POA: Diagnosis not present

## 2022-04-17 DIAGNOSIS — C9002 Multiple myeloma in relapse: Secondary | ICD-10-CM | POA: Insufficient documentation

## 2022-04-17 DIAGNOSIS — Z5112 Encounter for antineoplastic immunotherapy: Secondary | ICD-10-CM | POA: Diagnosis not present

## 2022-04-17 DIAGNOSIS — C7951 Secondary malignant neoplasm of bone: Secondary | ICD-10-CM | POA: Insufficient documentation

## 2022-04-17 DIAGNOSIS — C9001 Multiple myeloma in remission: Secondary | ICD-10-CM | POA: Diagnosis not present

## 2022-04-17 LAB — CBC WITH DIFFERENTIAL (CANCER CENTER ONLY)
Abs Immature Granulocytes: 0 10*3/uL (ref 0.00–0.07)
Basophils Absolute: 0.1 10*3/uL (ref 0.0–0.1)
Basophils Relative: 2 %
Eosinophils Absolute: 0.3 10*3/uL (ref 0.0–0.5)
Eosinophils Relative: 7 %
HCT: 34.8 % — ABNORMAL LOW (ref 39.0–52.0)
Hemoglobin: 11.7 g/dL — ABNORMAL LOW (ref 13.0–17.0)
Immature Granulocytes: 0 %
Lymphocytes Relative: 39 %
Lymphs Abs: 1.4 10*3/uL (ref 0.7–4.0)
MCH: 28.8 pg (ref 26.0–34.0)
MCHC: 33.6 g/dL (ref 30.0–36.0)
MCV: 85.7 fL (ref 80.0–100.0)
Monocytes Absolute: 0.5 10*3/uL (ref 0.1–1.0)
Monocytes Relative: 14 %
Neutro Abs: 1.3 10*3/uL — ABNORMAL LOW (ref 1.7–7.7)
Neutrophils Relative %: 38 %
Platelet Count: 173 10*3/uL (ref 150–400)
RBC: 4.06 MIL/uL — ABNORMAL LOW (ref 4.22–5.81)
RDW: 14.5 % (ref 11.5–15.5)
WBC Count: 3.5 10*3/uL — ABNORMAL LOW (ref 4.0–10.5)
nRBC: 0 % (ref 0.0–0.2)

## 2022-04-17 MED ORDER — DEXAMETHASONE 4 MG PO TABS
20.0000 mg | ORAL_TABLET | Freq: Once | ORAL | Status: AC
  Administered 2022-04-17: 20 mg via ORAL
  Filled 2022-04-17: qty 5

## 2022-04-17 MED ORDER — DIPHENHYDRAMINE HCL 25 MG PO CAPS
50.0000 mg | ORAL_CAPSULE | Freq: Once | ORAL | Status: AC
  Administered 2022-04-17: 50 mg via ORAL
  Filled 2022-04-17: qty 2

## 2022-04-17 MED ORDER — FAMOTIDINE IN NACL 20-0.9 MG/50ML-% IV SOLN
20.0000 mg | Freq: Once | INTRAVENOUS | Status: AC
  Administered 2022-04-17: 20 mg via INTRAVENOUS
  Filled 2022-04-17: qty 50

## 2022-04-17 MED ORDER — SODIUM CHLORIDE 0.9 % IV SOLN
16.0000 mg/kg | Freq: Once | INTRAVENOUS | Status: AC
  Administered 2022-04-17: 1500 mg via INTRAVENOUS
  Filled 2022-04-17: qty 60

## 2022-04-17 MED ORDER — FAMOTIDINE IN NACL 20-0.9 MG/50ML-% IV SOLN: 20.0000 mg | Freq: Once | INTRAVENOUS | Status: DC

## 2022-04-17 MED ORDER — SODIUM CHLORIDE 0.9 % IV SOLN: Freq: Once | INTRAVENOUS | Status: AC

## 2022-04-17 MED ORDER — SODIUM CHLORIDE 0.9 % IV SOLN
16.0000 mg/kg | Freq: Once | INTRAVENOUS | Status: DC
  Filled 2022-04-17: qty 75

## 2022-04-17 MED ORDER — DEXAMETHASONE 4 MG PO TABS: 20.0000 mg | ORAL_TABLET | Freq: Once | ORAL | Status: DC

## 2022-04-17 MED ORDER — ACETAMINOPHEN 325 MG PO TABS: 650.0000 mg | ORAL_TABLET | Freq: Once | ORAL | Status: DC

## 2022-04-17 MED ORDER — DIPHENHYDRAMINE HCL 25 MG PO CAPS: 50.0000 mg | ORAL_CAPSULE | Freq: Once | ORAL | Status: DC

## 2022-04-17 MED ORDER — ACETAMINOPHEN 325 MG PO TABS
650.0000 mg | ORAL_TABLET | Freq: Once | ORAL | Status: AC
  Administered 2022-04-17: 650 mg via ORAL
  Filled 2022-04-17: qty 2

## 2022-04-17 NOTE — Progress Notes (Signed)
Ok for tx today. MD aware of all lab results/ ANC of 1.3

## 2022-04-17 NOTE — Progress Notes (Signed)
Patient seen by MD today  Vitals are within treatment parameters.  Labs reviewed: and are within treatment parameters.  Per physician team, patient is ready for treatment and there are NO modifications to the treatment plan.  

## 2022-04-17 NOTE — Progress Notes (Signed)
HEMATOLOGY/ONCOLOGY CLINIC NOTE  Date of Service: 04/17/22   Patient Care Team: [name], MD as PCP - General (Family Medicine) [name], MD (Cardiology) [name], MD (Cardiology) [name], DO as Consulting Physician (Neurology)  CHIEF COMPLAINTS/PURPOSE OF CONSULTATION:  Follow-up for continued evaluation and management of multiple myeloma  HISTORY OF PRESENTING ILLNESS:  Please see previous note for details on initial presentation.  INTERVAL HISTORY:   Jonathon [name] is a 86 y.o. male here for continued evaluation and management of multiple myeloma.  Patient was last seen by Dede Query PA-C on 03/20/2022 and was doing well overall.   Patient reports he has been doing well overall. He complains of mild decreased energy while walking. He is staying physically active, eating well, and staying well hydrated. Patient denies any toxicities with his Revlimid.   Patient denies abdominal pain, bone pain, nausea, fever, chills, back pain, diarrhea, dental issues, or any new infection issues. However, he does complains of occasional bilateral leg swelling and mild lower back pain when he stands without any support.    MEDICAL HISTORY:  Past Medical History:  Diagnosis Date   Asthma    BENIGN PROSTATIC HYPERTROPHY, HX OF 10/25/2008   Bone metastases 04/14/2018   BRADYCARDIA 2005   Chronic diastolic congestive heart failure ([place]) 06/05/2016   CIDP (chronic inflammatory demyelinating polyneuropathy) ([place])    HYPERTENSION 11/21/2006   Iron deficiency anemia, unspecified 04/19/2013   Multiple myeloma ([place]) 04/29/2018   NEPHROLITHIASIS, HX OF 11/21/2006 and 10/15/2008   PACEMAKER, PERMANENT 2005   Gen change 2014 Medtronic Adaptic L dual-chamber pacemaker, serial #[id number]    SVT (supraventricular tachycardia)    TOBACCO ABUSE 10/25/2008   Quit 2012    SURGICAL HISTORY: Past Surgical History:  Procedure Laterality Date   COLONOSCOPY  2004,2009   PACEMAKER  INSERTION  05/14/2003   PERMANENT PACEMAKER GENERATOR CHANGE N/A 06/19/2012   Procedure: PERMANENT PACEMAKER GENERATOR CHANGE;  Surgeon: [name], MD; Medtronic Adaptic L dual-chamber pacemaker, serial #[id number]     SKIN GRAFT Right 05/13/1960   wrist   TOOTH EXTRACTION  11/10/2020   3 teeth removed    SOCIAL HISTORY: Social History   Socioeconomic History   Marital status: Divorced    Spouse name: Not on file   Number of children: 4   Years of education: Not on file   Highest education level: Not on file  Occupational History   Occupation: ENVIROMENTAL SERVICE    Employer: [employer]   Occupation: retired  Tobacco Use   Smoking status: Former    Packs/day: 0.25    Years: 46.00    Total pack years: 11.50    Types: Cigarettes    Start date: 03/16/1965    Quit date: 06/15/2010    Years since quitting: 11.7   Smokeless tobacco: Never   Tobacco comments:    Former smoker 10/11/21 quit 3 years ago  Vaping Use   Vaping Use: Never used  Substance and Sexual Activity   Alcohol use: Yes    Alcohol/week: 6.0 - 10.0 standard drinks of alcohol    Types: 6 - 10 Glasses of wine per week    Comment: 1-2 glasses every weekend 10/11/21   Drug use: No   Sexual activity: Not on file  Other Topics Concern   Not on file  Social History Narrative   Has relocated from [place] in 2007. 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  Patient in a facility thru [facility]  Social Determinants of Health   Financial Resource Strain: Low Risk  (02/07/2022)   Overall Financial Resource Strain (CARDIA)    Difficulty of Paying Living Expenses: Not hard at all  Food Insecurity: No Food Insecurity (02/28/2022)   Hunger Vital Sign    Worried About Running Out of Food in the Last Year: Never true    Ran Out of Food in the Last Year: Never true  Transportation Needs: No Transportation Needs (02/07/2022)   PRAPARE - Hydrologist (Medical): No    Lack of Transportation  (Non-Medical): No  Physical Activity: Sufficiently Active (02/07/2022)   Exercise Vital Sign    Days of Exercise per Week: 7 days    Minutes of Exercise per Session: 50 min  Stress: No Stress Concern Present (02/07/2022)   [place]    Feeling of Stress : Not at all  Social Connections: Socially Isolated (02/07/2022)   Social Connection and Isolation Panel [NHANES]    Frequency of Communication with Friends and Family: More than three times a week    Frequency of Social Gatherings with Friends and Family: More than three times a week    Attends Religious Services: Never    Marine scientist or Organizations: No    Attends Archivist Meetings: Never    Marital Status: Divorced  Human resources officer Violence: Not At Risk (02/07/2022)   Humiliation, Afraid, Rape, and Kick questionnaire    Fear of Current or Ex-Partner: No    Emotionally Abused: No    Physically Abused: No    Sexually Abused: No    FAMILY HISTORY: Family History  Problem Relation Age of Onset   Heart attack Father        Died, 39   Kidney disease Father    Parkinson's disease Mother        Died, 47   Breast cancer Sister        Living, 11   Diabetes type II Brother        Living, 32   Breast cancer Sister        Living, 95   Diabetes Daughter        Living, 47   Diabetes Son        Living, 56   Ovarian cancer Daughter    Colon cancer Neg Hx    Rectal cancer Neg Hx    Stomach cancer Neg Hx     ALLERGIES:  is allergic to lexapro [escitalopram oxalate].  MEDICATIONS:  Current Outpatient Medications  Medication Sig Dispense Refill   apixaban (ELIQUIS) 5 MG TABS tablet Take 1 tablet (5 mg total) by mouth 2 (two) times daily. 60 tablet 11   colchicine 0.6 MG tablet Take 2 tabs (1.2 mg) now.  Then take 1 tab (0.6 mg) 1 hour later.  Then take 1 tab daily until gout flare resolves. 14 tablet 0   diltiazem (TIAZAC) 240 MG 24 hr capsule  Take 1 capsule by mouth daily.     flecainide (TAMBOCOR) 150 MG tablet Take 0.5 tablets (75 mg total) by mouth 2 (two) times daily. 90 tablet 3   furosemide (LASIX) 40 MG tablet Take 40 mg by mouth daily.     irbesartan (AVAPRO) 300 MG tablet Take 300 mg by mouth daily.     lenalidomide (REVLIMID) 5 MG capsule TAKE 1 CAPSULE BY MOUTH 1 TIME A DAY 21 capsule 0   metoprolol tartrate (LOPRESSOR) 50  MG tablet Take 1 tablet (50 mg total) by mouth 2 (two) times daily. 60 tablet 0   polyethylene glycol (MIRALAX / GLYCOLAX) packet Take 17 g by mouth daily. 14 each 0   senna-docusate (SENOKOT-S) 8.6-50 MG tablet Take 2 tablets by mouth 2 (two) times daily as needed for mild constipation. 30 tablet 0   tamsulosin (FLOMAX) 0.4 MG CAPS capsule Take 2 capsules (0.8 mg total) by mouth daily. 90 capsule 0   traMADol (ULTRAM) 50 MG tablet Take 1 tablet (50 mg total) by mouth every 6 (six) hours as needed. 30 tablet 0   No current facility-administered medications for this visit.   Facility-Administered Medications Ordered in Other Visits  Medication Dose Route Frequency Provider Last Rate Last Admin   [MAR Hold] sodium chloride flush (NS) 0.9 % injection 10 mL  10 mL Intracatheter PRN Brunetta Genera, MD        REVIEW OF SYSTEMS:   10 Point review of Systems was done is negative except as noted above.  PHYSICAL EXAMINATION: ECOG PERFORMANCE STATUS: 1 - Symptomatic but completely ambulatory  NAD GENERAL:alert, in no acute distress and comfortable SKIN: no acute rashes, no significant lesions EYES: conjunctiva are pink and non-injected, sclera anicteric NECK: supple, no JVD LYMPH:  no palpable lymphadenopathy in the cervical regions LUNGS: clear to auscultation b/l with normal respiratory effort HEART: regular rate & rhythm Extremity: no pedal edema PSYCH: alert & oriented x 3 with fluent speech NEURO: no focal motor/sensory deficits  LABORATORY DATA:  I have reviewed the data as listed .     Latest Ref Rng & Units 04/17/2022   10:15 AM 03/20/2022    9:42 AM 02/20/2022   10:17 AM  CBC  WBC 4.0 - 10.5 K/uL 3.5  3.3  3.9   Hemoglobin 13.0 - 17.0 g/dL 11.7  11.6  12.0   Hematocrit 39.0 - 52.0 % 34.8  33.9  35.9   Platelets 150 - 400 K/uL 173  149  132        Latest Ref Rng & Units 03/20/2022    9:45 AM 01/23/2022   10:11 AM 12/25/2021    9:00 AM  CMP  Glucose 70 - 99 mg/dL 104  119  118   BUN 8 - 23 mg/dL _0 Creatinine 0.61 - 1.24 mg/dL 1.29  1.21  1.31   Sodium 135 - 145 mmol/L 139  138  139   Potassium 3.5 - 5.1 mmol/L 4.2  3.9  3.8   Chloride 98 - 111 mmol/L 106  105  108   CO2 22 - 32 mmol/L _1 Calcium 8.9 - 10.3 mg/dL 9.3  9.4  9.5   Total Protein 6.5 - 8.1 g/dL 6.6  6.3  6.5   Total Bilirubin 0.3 - 1.2 mg/dL 0.5  0.5  0.7   Alkaline Phos 38 - 126 U/L 149  143  146   AST 15 - 41 U/L _2 ALT 0 - 44 U/L _3 04/17/18 BM Bx:    RADIOGRAPHIC STUDIES: I have personally reviewed the radiological images as listed and agreed with the findings in the report. CUP PACEART INCLINIC DEVICE CHECK  Result Date: 03/19/2022 Pacemaker check in clinic. Normal device function. Thresholds, sensing, impedances consistent with previous measurements. Device programmed to maximize longevity. No mode switch or high ventricular rates noted. Device  programmed at appropriate safety margins. Histogram distribution appropriate for patient activity level. Device programmed to optimize intrinsic conduction. Estimated longevity 3 years. Patient enrolled in remote follow-up. Patient education completed.Myrtie Hawk, BSN, RN   ASSESSMENT & PLAN:   86 y.o. male with  1.  Relapsed multiple myeloma multiple bone metastases   labs upon initial presentation from 04/14/18; hypercalcemic with Calcium at 13.8, renal function up form baseline with Creatinine at 1.48, HGB at 11.3. PSA was normal.   03/31/18 CT C/A/P revealed 1. Lytic lesion throughout all  visualized vertebra from the lower cervical spine through the sacrum. Epidural extension of tumor T2, T3 and L2 level. Right L3-4 foraminal extension tumor. 2. Lytic lesions involving majority of ribs. Multiple pathologic rib fractures with bony expansion/sub pleural extension of tumor at several levels. 3. Lytic lesions of the scapula, hips bilaterally and pelvis bilaterally. With further progression of tumor, patient may be at risk for pathologic fractures. Pathologic fracture right clavicle incompletely assessed. 4. Circumferential narrowing sigmoid colon. Question possibility of underlying mass (series 2, image 97). Alternatively this could represent muscular hypertrophy/peristalsis. 5. Gastric antrum circumferential narrowing. Cannot exclude mass although this may be related to peristalsis. 6. No primary lung malignancy noted. 7. Matted aortic pulmonary window lymph nodes. 8. Circumferential urinary bladder wall thickening greater anterior dome region. Right lateral bladder diverticulum. 9. 5 mm low-density lesion pancreatic body (series 5, image 10) unchanged. This is felt unlikely to be a primary malignancy. 10. Right parapelvic 2.2 cm cyst without significant change. Scattered low-density renal lesions bilaterally too small to adequately characterize. Some were present previously. Statistically these are likely cysts. 11. Stable adrenal gland hyperplasia. 12. Gynecomastia. 13. Prostate gland causes slight impression upon the bladder base. 14.  Aortic Atherosclerosis. 15. Sequential pacemaker in place. 16. Gallstones.   04/23/18 PET/CT revealed Innumerable hypermetabolic lytic lesions throughout the skeleton especially involving the spine, ribs, bony pelvis along with some involvement of both proximal femurs. The appearance is compatible with pathologic diagnosis of plasma cell neoplasm could well reflect multiple myeloma. Resulting pathologic fractures of the right lateral clavicle and of multiple  bilateral ribs. Lesions are present along the cortical margins of the spinal canal. 2. Both the area and the stomach in the area in the sigmoid colon drawn attention to on prior CT scan appear benign normal today. 3. The confluence of AP window lymph nodes measures about 1.2 cm in short axis with maximum SUV 3.1, which is mildly above the blood pool merit surveillance. 4. Other imaging findings of potential clinical significance: Aortic Atherosclerosis. Coronary atherosclerosis. Pacemaker noted. Borderline prostatomegaly. Cholelithiasis.  04/21/18 BM Bx revealed Normocellular bone marrow with Plasma Cell neoplasm. Scattered medium sized clusters of kappa-restricted plasma cells (14% aspirate, 10% CD138 immunohistochemistry.   I do suspect that the burden of disease is underestimated in the bone marrow sample given PET/CT findings of innumerable lytic lesions and an M spike of 3.4g   06/08/18 DG Hips bilat which revealed Innumerable lucencies are noted throughout the pelvis and proximal femurs, similar findings noted on prior PET-CT of 04/23/2018. Findings are consistent patient's known multiple myeloma. Femoral necks are intact. 2.  Aortoiliac atherosclerotic vascular disease.  08/06/2021 PET/CT revealed "1. Interval development of consolidative masslike opacities in both upper lobes demonstrating marked hypermetabolism. While these may be infectious/inflammatory, FDG accumulation is higher than typically seen in that setting. Metastatic disease not excluded. 2. Dominant left rib, right scapula, and left sacral lesions all show marked interval decrease in size and hypermetabolism. 3.  Soft tissue nodule in the midline back adjacent to the L4 spinous process is similar in size and hypermetabolism today. 4. Interval decrease in size and hypermetabolism in the gastrohepatic lymph node identified previously. 5. Multiple scattered osseous lesions compatible with treated multiple myeloma. 6.  Cholelithiasis."   2.  Pulmonary infiltrates  --Likely related to radiation related change.  PLAN:    -Discussed lab results from 04/17/2022 with the patient. CBC shows WBC of 3.5 K, hemoglobin of 11.7 K, and platelets of 173 K. CMP shows Creatinine of 1.29. -Discussed the recent myeloma lab results 03/20/22, which shows undetectable M-protein levels.  --continued to [employer] --Proceed with monthly Daratumumab treatment today and continue Revlimid 5 mg, 3  weeks on and 1 week off. -- orders reviewed and signed.  --Revlimid refills and authorization obtained monthly. --confirmed patients vaccinations  FOLLOW-UP: Per integrated scheduling  The total time spent in the appointment was 30 minutes* .  All of the patient's questions were answered with apparent satisfaction. The patient knows to call the clinic with any problems, questions or concerns.   Sullivan Lone MD MS AAHIVMS [facility] Hematology/Oncology Physician [facility]  .*Total Encounter Time as defined by the Centers for Medicare and Medicaid Services includes, in addition to the face-to-face time of a patient visit (documented in the note above) non-face-to-face time: obtaining and reviewing outside history, ordering and reviewing medications, tests or procedures, care coordination (communications with other health care professionals or caregivers) and documentation in the medical record.   Zettie Cooley, am acting as a Education administrator for Sullivan Lone, MD .I have reviewed the above documentation for accuracy and completeness, and I agree with the above. Brunetta Genera MD

## 2022-04-17 NOTE — Patient Instructions (Signed)
[facility] ONCOLOGY  Discharge Instructions: Thank you for choosing [facility] to provide your oncology and hematology care.   If you have a lab appointment with the [facility], please go directly to the [facility] and check in at the registration area.   Wear comfortable clothing and clothing appropriate for easy access to any Portacath or PICC line.   We strive to give you quality time with your provider. You may need to reschedule your appointment if you arrive late (15 or more minutes).  Arriving late affects you and other patients whose appointments are after yours.  Also, if you miss three or more appointments without notifying the office, you may be dismissed from the clinic at the provider's discretion.      For prescription refill requests, have your pharmacy contact our office and allow 72 hours for refills to be completed.    Today you received the following chemotherapy and/or immunotherapy agents : Daratumumab       To help prevent nausea and vomiting after your treatment, we encourage you to take your nausea medication as directed.  BELOW ARE SYMPTOMS THAT SHOULD BE REPORTED IMMEDIATELY: *FEVER GREATER THAN 100.4 F (38 C) OR HIGHER *CHILLS OR SWEATING *NAUSEA AND VOMITING THAT IS NOT CONTROLLED WITH YOUR NAUSEA MEDICATION *UNUSUAL SHORTNESS OF BREATH *UNUSUAL BRUISING OR BLEEDING *URINARY PROBLEMS (pain or burning when urinating, or frequent urination) *BOWEL PROBLEMS (unusual diarrhea, constipation, pain near the anus) TENDERNESS IN MOUTH AND THROAT WITH OR WITHOUT PRESENCE OF ULCERS (sore throat, sores in mouth, or a toothache) UNUSUAL RASH, SWELLING OR PAIN  UNUSUAL VAGINAL DISCHARGE OR ITCHING   Items with * indicate a potential emergency and should be followed up as soon as possible or go to the Emergency Department if any problems should occur.  Please show the CHEMOTHERAPY ALERT CARD or IMMUNOTHERAPY ALERT CARD at  check-in to the Emergency Department and triage nurse.  Should you have questions after your visit or need to cancel or reschedule your appointment, please contact [facility]  Dept: [phone]  and follow the prompts.  Office hours are 8:00 a.m. to 4:30 p.m. Monday - Friday. Please note that voicemails left after 4:00 p.m. may not be returned until the following business day.  We are closed weekends and major holidays. You have access to a nurse at all times for urgent questions. Please call the main number to the clinic Dept: [phone] and follow the prompts.   For any non-urgent questions, you may also contact your provider using MyChart. We now offer e-Visits for anyone 56 and older to request care online for non-urgent symptoms. For details visit [email or web address].   Also download the MyChart app! Go to the app store, search "MyChart", open the app, select [facility], and log in with your MyChart username and password.  Masks are optional in the cancer centers. If you would like for your care team to wear a mask while they are taking care of you, please let them know. You may have one support person who is at least 84 years old accompany you for your appointments.

## 2022-04-23 ENCOUNTER — Encounter: Payer: Self-pay | Admitting: Hematology

## 2022-04-26 ENCOUNTER — Other Ambulatory Visit: Payer: Self-pay | Admitting: Hematology

## 2022-04-26 ENCOUNTER — Other Ambulatory Visit: Payer: Self-pay

## 2022-04-26 DIAGNOSIS — C9000 Multiple myeloma not having achieved remission: Secondary | ICD-10-CM

## 2022-04-26 MED ORDER — LENALIDOMIDE 5 MG PO CAPS: ORAL_CAPSULE | ORAL | 0 refills | Status: DC

## 2022-05-15 ENCOUNTER — Inpatient Hospital Stay: Payer: Medicare Other | Attending: Hematology

## 2022-05-15 ENCOUNTER — Inpatient Hospital Stay: Payer: Medicare Other

## 2022-05-15 ENCOUNTER — Inpatient Hospital Stay (HOSPITAL_BASED_OUTPATIENT_CLINIC_OR_DEPARTMENT_OTHER): Payer: Medicare Other | Admitting: Hematology

## 2022-05-15 ENCOUNTER — Other Ambulatory Visit: Payer: Self-pay

## 2022-05-15 VITALS — BP 135/71 | HR 74 | Temp 97.3°F | Resp 18 | Wt 216.2 lb

## 2022-05-15 VITALS — BP 138/71 | HR 60 | Temp 98.1°F | Resp 16

## 2022-05-15 DIAGNOSIS — Z7189 Other specified counseling: Secondary | ICD-10-CM

## 2022-05-15 DIAGNOSIS — C9001 Multiple myeloma in remission: Secondary | ICD-10-CM

## 2022-05-15 DIAGNOSIS — K8020 Calculus of gallbladder without cholecystitis without obstruction: Secondary | ICD-10-CM | POA: Diagnosis not present

## 2022-05-15 DIAGNOSIS — Z5112 Encounter for antineoplastic immunotherapy: Secondary | ICD-10-CM | POA: Insufficient documentation

## 2022-05-15 DIAGNOSIS — Z803 Family history of malignant neoplasm of breast: Secondary | ICD-10-CM | POA: Diagnosis not present

## 2022-05-15 DIAGNOSIS — Z87891 Personal history of nicotine dependence: Secondary | ICD-10-CM | POA: Insufficient documentation

## 2022-05-15 DIAGNOSIS — R918 Other nonspecific abnormal finding of lung field: Secondary | ICD-10-CM | POA: Diagnosis not present

## 2022-05-15 DIAGNOSIS — M5441 Lumbago with sciatica, right side: Secondary | ICD-10-CM | POA: Diagnosis not present

## 2022-05-15 DIAGNOSIS — C9002 Multiple myeloma in relapse: Secondary | ICD-10-CM | POA: Diagnosis not present

## 2022-05-15 DIAGNOSIS — I10 Essential (primary) hypertension: Secondary | ICD-10-CM | POA: Insufficient documentation

## 2022-05-15 LAB — CBC WITH DIFFERENTIAL (CANCER CENTER ONLY)
Abs Immature Granulocytes: 0 10*3/uL (ref 0.00–0.07)
Basophils Absolute: 0.1 10*3/uL (ref 0.0–0.1)
Basophils Relative: 3 %
Eosinophils Absolute: 0.3 10*3/uL (ref 0.0–0.5)
Eosinophils Relative: 7 %
HCT: 34.5 % — ABNORMAL LOW (ref 39.0–52.0)
Hemoglobin: 11.7 g/dL — ABNORMAL LOW (ref 13.0–17.0)
Immature Granulocytes: 0 %
Lymphocytes Relative: 37 %
Lymphs Abs: 1.6 10*3/uL (ref 0.7–4.0)
MCH: 28.6 pg (ref 26.0–34.0)
MCHC: 33.9 g/dL (ref 30.0–36.0)
MCV: 84.4 fL (ref 80.0–100.0)
Monocytes Absolute: 0.6 10*3/uL (ref 0.1–1.0)
Monocytes Relative: 15 %
Neutro Abs: 1.6 10*3/uL — ABNORMAL LOW (ref 1.7–7.7)
Neutrophils Relative %: 38 %
Platelet Count: 158 10*3/uL (ref 150–400)
RBC: 4.09 MIL/uL — ABNORMAL LOW (ref 4.22–5.81)
RDW: 15 % (ref 11.5–15.5)
WBC Count: 4.2 10*3/uL (ref 4.0–10.5)
nRBC: 0 % (ref 0.0–0.2)

## 2022-05-15 LAB — CMP (CANCER CENTER ONLY)
ALT: 9 U/L (ref 0–44)
AST: 16 U/L (ref 15–41)
Albumin: 3.7 g/dL (ref 3.5–5.0)
Alkaline Phosphatase: 119 U/L (ref 38–126)
Anion gap: 8 (ref 5–15)
BUN: 15 mg/dL (ref 8–23)
CO2: 24 mmol/L (ref 22–32)
Calcium: 8.9 mg/dL (ref 8.9–10.3)
Chloride: 103 mmol/L (ref 98–111)
Creatinine: 1.25 mg/dL — ABNORMAL HIGH (ref 0.61–1.24)
GFR, Estimated: 57 mL/min — ABNORMAL LOW (ref >60)
Glucose, Bld: 102 mg/dL — ABNORMAL HIGH (ref 70–99)
Potassium: 4 mmol/L (ref 3.5–5.1)
Sodium: 135 mmol/L (ref 135–145)
Total Bilirubin: 0.7 mg/dL (ref 0.3–1.2)
Total Protein: 6.6 g/dL (ref 6.5–8.1)

## 2022-05-15 MED ORDER — SODIUM CHLORIDE 0.9 % IV SOLN: Freq: Once | INTRAVENOUS | Status: AC

## 2022-05-15 MED ORDER — DIPHENHYDRAMINE HCL 25 MG PO CAPS
50.0000 mg | ORAL_CAPSULE | Freq: Once | ORAL | Status: AC
  Administered 2022-05-15: 50 mg via ORAL
  Filled 2022-05-15: qty 2

## 2022-05-15 MED ORDER — DEXAMETHASONE 4 MG PO TABS
20.0000 mg | ORAL_TABLET | Freq: Once | ORAL | Status: AC
  Administered 2022-05-15: 20 mg via ORAL
  Filled 2022-05-15: qty 5

## 2022-05-15 MED ORDER — FAMOTIDINE IN NACL 20-0.9 MG/50ML-% IV SOLN
20.0000 mg | Freq: Once | INTRAVENOUS | Status: AC
  Administered 2022-05-15: 20 mg via INTRAVENOUS
  Filled 2022-05-15: qty 50

## 2022-05-15 MED ORDER — ACETAMINOPHEN 325 MG PO TABS
650.0000 mg | ORAL_TABLET | Freq: Once | ORAL | Status: AC
  Administered 2022-05-15: 650 mg via ORAL
  Filled 2022-05-15: qty 2

## 2022-05-15 MED ORDER — SODIUM CHLORIDE 0.9 % IV SOLN
16.0000 mg/kg | Freq: Once | INTRAVENOUS | Status: AC
  Administered 2022-05-15: 1500 mg via INTRAVENOUS
  Filled 2022-05-15: qty 60

## 2022-05-15 NOTE — Progress Notes (Signed)
Patient seen by MD today  Vitals are within treatment parameters.  Labs reviewed: and are within treatment parameters." CMP was not drawn. I will have lab draw in infusion  Per physician team, patient is ready for treatment and there are NO modifications to the treatment plan. Pt may proceed with treatment today. No need to wait on CMP to result per DR Irene Limbo.

## 2022-05-15 NOTE — Progress Notes (Signed)
HEMATOLOGY/ONCOLOGY CLINIC NOTE  Date of Service: 05/15/22   Patient Care Team: [name], MD as PCP - General (Family Medicine) [name], MD (Cardiology) [name], MD (Cardiology) [name], DO as Consulting Physician (Neurology)  CHIEF COMPLAINTS/PURPOSE OF CONSULTATION:  Follow-up for continued evaluation and management of multiple myeloma  HISTORY OF PRESENTING ILLNESS:  Please see previous note for details on initial presentation.  INTERVAL HISTORY:   Jonathon [name] is a 87 y.o. male here for continued evaluation and management of multiple myeloma.  He was last seen by me on 04/17/22 and complained of occasional bilateral LE edema, mild lower back pain, and mild decreased energy.  Today, he complains of lower back pain that radiates down his right leg when seating for a long period of time. His symptoms began a week ago. He denies any unusual recent activity. He denies any weakness or tingling/numbness.   He also complains of new lump on his lower back. He denies any localized pain/soreness. He describes some associated weakness.  He denies any diarrhea, loss of appetite, abdominal pain infection issues, change in bowel habits, fever, chills, night sweats, or leg swelling.   He is UTD on his vaccinations, including the influenza, RSV, and COVID-19 booster.   MEDICAL HISTORY:  Past Medical History:  Diagnosis Date   Asthma    BENIGN PROSTATIC HYPERTROPHY, HX OF 10/25/2008   Bone metastases 04/14/2018   BRADYCARDIA 2005   Chronic diastolic congestive heart failure ([place]) 06/05/2016   CIDP (chronic inflammatory demyelinating polyneuropathy) ([place])    HYPERTENSION 11/21/2006   Iron deficiency anemia, unspecified 04/19/2013   Multiple myeloma ([place]) 04/29/2018   NEPHROLITHIASIS, HX OF 11/21/2006 and 10/15/2008   PACEMAKER, PERMANENT 2005   Gen change 2014 Medtronic Adaptic L dual-chamber pacemaker, serial #[id number]    SVT (supraventricular tachycardia)     TOBACCO ABUSE 10/25/2008   Quit 2012    SURGICAL HISTORY: Past Surgical History:  Procedure Laterality Date   COLONOSCOPY  2004,2009   PACEMAKER INSERTION  05/14/2003   PERMANENT PACEMAKER GENERATOR CHANGE N/A 06/19/2012   Procedure: PERMANENT PACEMAKER GENERATOR CHANGE;  Surgeon: [name], MD; Medtronic Adaptic L dual-chamber pacemaker, serial #[id number]     SKIN GRAFT Right 05/13/1960   wrist   TOOTH EXTRACTION  11/10/2020   3 teeth removed    SOCIAL HISTORY: Social History   Socioeconomic History   Marital status: Divorced    Spouse name: Not on file   Number of children: 4   Years of education: Not on file   Highest education level: Not on file  Occupational History   Occupation: ENVIROMENTAL SERVICE    Employer: [employer]   Occupation: retired  Tobacco Use   Smoking status: Former    Packs/day: 0.25    Years: 46.00    Total pack years: 11.50    Types: Cigarettes    Start date: 03/16/1965    Quit date: 06/15/2010    Years since quitting: 11.9   Smokeless tobacco: Never   Tobacco comments:    Former smoker 10/11/21 quit 3 years ago  Vaping Use   Vaping Use: Never used  Substance and Sexual Activity   Alcohol use: Yes    Alcohol/week: 6.0 - 10.0 standard drinks of alcohol    Types: 6 - 10 Glasses of wine per week    Comment: 1-2 glasses every weekend 10/11/21   Drug use: No   Sexual activity: Not on file  Other Topics  Concern   Not on file  Social History Narrative   Has relocated from [place] in 2007. 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  Patient in a facility thru [facility]   Social Determinants of Health   Financial Resource Strain: Low Risk  (02/07/2022)   Overall Financial Resource Strain (CARDIA)    Difficulty of Paying Living Expenses: Not hard at all  Food Insecurity: No Food Insecurity (02/28/2022)   Hunger Vital Sign    Worried About Running Out of Food in the Last Year: Never true    Ran Out of Food in the Last Year: Never true   Transportation Needs: No Transportation Needs (02/07/2022)   PRAPARE - Hydrologist (Medical): No    Lack of Transportation (Non-Medical): No  Physical Activity: Sufficiently Active (02/07/2022)   Exercise Vital Sign    Days of Exercise per Week: 7 days    Minutes of Exercise per Session: 50 min  Stress: No Stress Concern Present (02/07/2022)   [place]    Feeling of Stress : Not at all  Social Connections: Socially Isolated (02/07/2022)   Social Connection and Isolation Panel [NHANES]    Frequency of Communication with Friends and Family: More than three times a week    Frequency of Social Gatherings with Friends and Family: More than three times a week    Attends Religious Services: Never    Marine scientist or Organizations: No    Attends Archivist Meetings: Never    Marital Status: Divorced  Human resources officer Violence: Not At Risk (02/07/2022)   Humiliation, Afraid, Rape, and Kick questionnaire    Fear of Current or Ex-Partner: No    Emotionally Abused: No    Physically Abused: No    Sexually Abused: No    FAMILY HISTORY: Family History  Problem Relation Age of Onset   Heart attack Father        Died, 89   Kidney disease Father    Parkinson's disease Mother        Died, 22   Breast cancer Sister        Living, 31   Diabetes type II Brother        Living, 65   Breast cancer Sister        Living, 47   Diabetes Daughter        Living, 14   Diabetes Son        Living, 59   Ovarian cancer Daughter    Colon cancer Neg Hx    Rectal cancer Neg Hx    Stomach cancer Neg Hx     ALLERGIES:  is allergic to lexapro [escitalopram oxalate].  MEDICATIONS:  Current Outpatient Medications  Medication Sig Dispense Refill   apixaban (ELIQUIS) 5 MG TABS tablet Take 1 tablet (5 mg total) by mouth 2 (two) times daily. 60 tablet 11   colchicine 0.6 MG tablet Take 2 tabs  (1.2 mg) now.  Then take 1 tab (0.6 mg) 1 hour later.  Then take 1 tab daily until gout flare resolves. 14 tablet 0   diltiazem (TIAZAC) 240 MG 24 hr capsule Take 1 capsule by mouth daily.     flecainide (TAMBOCOR) 150 MG tablet Take 0.5 tablets (75 mg total) by mouth 2 (two) times daily. 90 tablet 3   furosemide (LASIX) 40 MG tablet Take 40 mg by mouth daily.     irbesartan (AVAPRO) 300 MG tablet Take 300 mg  by mouth daily.     lenalidomide (REVLIMID) 5 MG capsule TAKE 1 CAPSULE BY MOUTH 1 TIME A DAY 21 capsule 0   metoprolol tartrate (LOPRESSOR) 50 MG tablet Take 1 tablet (50 mg total) by mouth 2 (two) times daily. 60 tablet 0   polyethylene glycol (MIRALAX / GLYCOLAX) packet Take 17 g by mouth daily. 14 each 0   senna-docusate (SENOKOT-S) 8.6-50 MG tablet Take 2 tablets by mouth 2 (two) times daily as needed for mild constipation. 30 tablet 0   tamsulosin (FLOMAX) 0.4 MG CAPS capsule Take 2 capsules (0.8 mg total) by mouth daily. 90 capsule 0   traMADol (ULTRAM) 50 MG tablet Take 1 tablet (50 mg total) by mouth every 6 (six) hours as needed. 30 tablet 0   No current facility-administered medications for this visit.   Facility-Administered Medications Ordered in Other Visits  Medication Dose Route Frequency Provider Last Rate Last Admin   [MAR Hold] sodium chloride flush (NS) 0.9 % injection 10 mL  10 mL Intracatheter PRN Brunetta Genera, MD        REVIEW OF SYSTEMS:   10 Point review of Systems was done is negative except as noted above.  PHYSICAL EXAMINATION: ECOG PERFORMANCE STATUS: 1 - Symptomatic but completely ambulatory  NAD GENERAL:alert, in no acute distress and comfortable SKIN: no acute rashes, no significant lesions EYES: conjunctiva are pink and non-injected, sclera anicteric NECK: supple, no JVD LYMPH:  no palpable lymphadenopathy in the cervical regions LUNGS: clear to auscultation b/l with normal respiratory effort HEART: regular rate & rhythm Extremity: no  pedal edema PSYCH: alert & oriented x 3 with fluent speech NEURO: no focal motor/sensory deficits  LABORATORY DATA:  I have reviewed the data as listed .    Latest Ref Rng & Units 04/17/2022   10:15 AM 03/20/2022    9:42 AM 02/20/2022   10:17 AM  CBC  WBC 4.0 - 10.5 K/uL 3.5  3.3  3.9   Hemoglobin 13.0 - 17.0 g/dL 11.7  11.6  12.0   Hematocrit 39.0 - 52.0 % 34.8  33.9  35.9   Platelets 150 - 400 K/uL 173  149  132        Latest Ref Rng & Units 03/20/2022    9:45 AM 01/23/2022   10:11 AM 12/25/2021    9:00 AM  CMP  Glucose 70 - 99 mg/dL 104  119  118   BUN 8 - 23 mg/dL _0 Creatinine 0.61 - 1.24 mg/dL 1.29  1.21  1.31   Sodium 135 - 145 mmol/L 139  138  139   Potassium 3.5 - 5.1 mmol/L 4.2  3.9  3.8   Chloride 98 - 111 mmol/L 106  105  108   CO2 22 - 32 mmol/L _1 Calcium 8.9 - 10.3 mg/dL 9.3  9.4  9.5   Total Protein 6.5 - 8.1 g/dL 6.6  6.3  6.5   Total Bilirubin 0.3 - 1.2 mg/dL 0.5  0.5  0.7   Alkaline Phos 38 - 126 U/L 149  143  146   AST 15 - 41 U/L _2 ALT 0 - 44 U/L _3 04/17/18 BM Bx:    RADIOGRAPHIC STUDIES: I have personally reviewed the radiological images as listed and agreed with the findings in the report. No results found.  ASSESSMENT & PLAN:  87 y.o. male with  1.  Relapsed multiple myeloma multiple bone metastases   labs upon initial presentation from 04/14/18; hypercalcemic with Calcium at 13.8, renal function up form baseline with Creatinine at 1.48, HGB at 11.3. PSA was normal.   03/31/18 CT C/A/P revealed 1. Lytic lesion throughout all visualized vertebra from the lower cervical spine through the sacrum. Epidural extension of tumor T2, T3 and L2 level. Right L3-4 foraminal extension tumor. 2. Lytic lesions involving majority of ribs. Multiple pathologic rib fractures with bony expansion/sub pleural extension of tumor at several levels. 3. Lytic lesions of the scapula, hips bilaterally and pelvis bilaterally.  With further progression of tumor, patient may be at risk for pathologic fractures. Pathologic fracture right clavicle incompletely assessed. 4. Circumferential narrowing sigmoid colon. Question possibility of underlying mass (series 2, image 97). Alternatively this could represent muscular hypertrophy/peristalsis. 5. Gastric antrum circumferential narrowing. Cannot exclude mass although this may be related to peristalsis. 6. No primary lung malignancy noted. 7. Matted aortic pulmonary window lymph nodes. 8. Circumferential urinary bladder wall thickening greater anterior dome region. Right lateral bladder diverticulum. 9. 5 mm low-density lesion pancreatic body (series 5, image 10) unchanged. This is felt unlikely to be a primary malignancy. 10. Right parapelvic 2.2 cm cyst without significant change. Scattered low-density renal lesions bilaterally too small to adequately characterize. Some were present previously. Statistically these are likely cysts. 11. Stable adrenal gland hyperplasia. 12. Gynecomastia. 13. Prostate gland causes slight impression upon the bladder base. 14.  Aortic Atherosclerosis. 15. Sequential pacemaker in place. 16. Gallstones.   04/23/18 PET/CT revealed Innumerable hypermetabolic lytic lesions throughout the skeleton especially involving the spine, ribs, bony pelvis along with some involvement of both proximal femurs. The appearance is compatible with pathologic diagnosis of plasma cell neoplasm could well reflect multiple myeloma. Resulting pathologic fractures of the right lateral clavicle and of multiple bilateral ribs. Lesions are present along the cortical margins of the spinal canal. 2. Both the area and the stomach in the area in the sigmoid colon drawn attention to on prior CT scan appear benign normal today. 3. The confluence of AP window lymph nodes measures about 1.2 cm in short axis with maximum SUV 3.1, which is mildly above the blood pool merit surveillance. 4. Other imaging  findings of potential clinical significance: Aortic Atherosclerosis. Coronary atherosclerosis. Pacemaker noted. Borderline prostatomegaly. Cholelithiasis.  04/21/18 BM Bx revealed Normocellular bone marrow with Plasma Cell neoplasm. Scattered medium sized clusters of kappa-restricted plasma cells (14% aspirate, 10% CD138 immunohistochemistry.   I do suspect that the burden of disease is underestimated in the bone marrow sample given PET/CT findings of innumerable lytic lesions and an M spike of 3.4g   06/08/18 DG Hips bilat which revealed Innumerable lucencies are noted throughout the pelvis and proximal femurs, similar findings noted on prior PET-CT of 04/23/2018. Findings are consistent patient's known multiple myeloma. Femoral necks are intact. 2.  Aortoiliac atherosclerotic vascular disease.  08/06/2021 PET/CT revealed "1. Interval development of consolidative masslike opacities in both upper lobes demonstrating marked hypermetabolism. While these may be infectious/inflammatory, FDG accumulation is higher than typically seen in that setting. Metastatic disease not excluded. 2. Dominant left rib, right scapula, and left sacral lesions all show marked interval decrease in size and hypermetabolism. 3. Soft tissue nodule in the midline back adjacent to the L4 spinous process is similar in size and hypermetabolism today. 4. Interval decrease in size and hypermetabolism in the gastrohepatic lymph node identified previously. 5. Multiple scattered osseous lesions compatible  with treated multiple myeloma. 6. Cholelithiasis."   2.  Pulmonary infiltrates  --Likely related to radiation related change.  PLAN:    - Discussed lab results from today, 05/15/2022, with the patient. CBC showed slightly decreased hemoglobin of 11.7 K and slightly decreased hematocrit of 34.5. Myeloma panel shows M spike of 0.2g/dl -Discussed options to either go ahead with MRI scan or wait for myeloma labs to  reveal. -MRI L spine was ordered but could not be done since his PPM is not MRI compatible. -we shall get a PET/CT instead of MRI  FOLLOW-UP: Pet/ct in 1 week Continue treatment per integrated scheduling.   The total time spent in the appointment was 25 minutes* .  All of the patient's questions were answered with apparent satisfaction. The patient knows to call the clinic with any problems, questions or concerns.   Sullivan Lone MD MS AAHIVMS [facility] Hematology/Oncology Physician [facility]  .*Total Encounter Time as defined by the Centers for Medicare and Medicaid Services includes, in addition to the face-to-face time of a patient visit (documented in the note above) non-face-to-face time: obtaining and reviewing outside history, ordering and reviewing medications, tests or procedures, care coordination (communications with other health care professionals or caregivers) and documentation in the medical record.   I,Mitra Faeizi,acting as a Education administrator for Sullivan Lone, MD.,have documented all relevant documentation on the behalf of Sullivan Lone, MD,as directed by  Sullivan Lone, MD while in the presence of Sullivan Lone, MD.  .I have reviewed the above documentation for accuracy and completeness, and I agree with the above. Brunetta Genera MD

## 2022-05-15 NOTE — Patient Instructions (Signed)
[facility] ONCOLOGY  Discharge Instructions: Thank you for choosing [facility] to provide your oncology and hematology care.   If you have a lab appointment with the [facility], please go directly to the [facility] and check in at the registration area.   Wear comfortable clothing and clothing appropriate for easy access to any Portacath or PICC line.   We strive to give you quality time with your provider. You may need to reschedule your appointment if you arrive late (15 or more minutes).  Arriving late affects you and other patients whose appointments are after yours.  Also, if you miss three or more appointments without notifying the office, you may be dismissed from the clinic at the provider's discretion.      For prescription refill requests, have your pharmacy contact our office and allow 72 hours for refills to be completed.    Today you received the following chemotherapy and/or immunotherapy agents: Darzalex      To help prevent nausea and vomiting after your treatment, we encourage you to take your nausea medication as directed.  BELOW ARE SYMPTOMS THAT SHOULD BE REPORTED IMMEDIATELY: *FEVER GREATER THAN 100.4 F (38 C) OR HIGHER *CHILLS OR SWEATING *NAUSEA AND VOMITING THAT IS NOT CONTROLLED WITH YOUR NAUSEA MEDICATION *UNUSUAL SHORTNESS OF BREATH *UNUSUAL BRUISING OR BLEEDING *URINARY PROBLEMS (pain or burning when urinating, or frequent urination) *BOWEL PROBLEMS (unusual diarrhea, constipation, pain near the anus) TENDERNESS IN MOUTH AND THROAT WITH OR WITHOUT PRESENCE OF ULCERS (sore throat, sores in mouth, or a toothache) UNUSUAL RASH, SWELLING OR PAIN  UNUSUAL VAGINAL DISCHARGE OR ITCHING   Items with * indicate a potential emergency and should be followed up as soon as possible or go to the Emergency Department if any problems should occur.  Please show the CHEMOTHERAPY ALERT CARD or IMMUNOTHERAPY ALERT CARD at check-in to  the Emergency Department and triage nurse.  Should you have questions after your visit or need to cancel or reschedule your appointment, please contact [facility]  Dept: [phone]  and follow the prompts.  Office hours are 8:00 a.m. to 4:30 p.m. Monday - Friday. Please note that voicemails left after 4:00 p.m. may not be returned until the following business day.  We are closed weekends and major holidays. You have access to a nurse at all times for urgent questions. Please call the main number to the clinic Dept: [phone] and follow the prompts.   For any non-urgent questions, you may also contact your provider using MyChart. We now offer e-Visits for anyone 8 and older to request care online for non-urgent symptoms. For details visit [email or web address].   Also download the MyChart app! Go to the app store, search "MyChart", open the app, select [facility], and log in with your MyChart username and password.

## 2022-05-17 ENCOUNTER — Other Ambulatory Visit: Payer: Self-pay | Admitting: Hematology

## 2022-05-20 ENCOUNTER — Encounter: Payer: Self-pay | Admitting: Hematology

## 2022-05-20 LAB — MULTIPLE MYELOMA PANEL, SERUM
Albumin SerPl Elph-Mcnc: 3.4 g/dL (ref 2.9–4.4)
Albumin/Glob SerPl: 1.1 (ref 0.7–1.7)
Alpha 1: 0.3 g/dL (ref 0.0–0.4)
Alpha2 Glob SerPl Elph-Mcnc: 0.9 g/dL (ref 0.4–1.0)
B-Globulin SerPl Elph-Mcnc: 1.3 g/dL (ref 0.7–1.3)
Gamma Glob SerPl Elph-Mcnc: 0.7 g/dL (ref 0.4–1.8)
Globulin, Total: 3.1 g/dL (ref 2.2–3.9)
IgA: 620 mg/dL — ABNORMAL HIGH (ref 61–437)
IgG (Immunoglobin G), Serum: 586 mg/dL — ABNORMAL LOW (ref 603–1613)
IgM (Immunoglobulin M), Srm: 37 mg/dL (ref 15–143)
M Protein SerPl Elph-Mcnc: 0.2 g/dL — ABNORMAL HIGH
Total Protein ELP: 6.5 g/dL (ref 6.0–8.5)

## 2022-05-21 ENCOUNTER — Other Ambulatory Visit: Payer: Self-pay

## 2022-05-29 DIAGNOSIS — M79675 Pain in left toe(s): Secondary | ICD-10-CM | POA: Diagnosis not present

## 2022-05-29 DIAGNOSIS — B351 Tinea unguium: Secondary | ICD-10-CM | POA: Diagnosis not present

## 2022-05-29 DIAGNOSIS — M79674 Pain in right toe(s): Secondary | ICD-10-CM | POA: Diagnosis not present

## 2022-05-31 ENCOUNTER — Ambulatory Visit (INDEPENDENT_AMBULATORY_CARE_PROVIDER_SITE_OTHER): Payer: Medicare Other | Admitting: Family Medicine

## 2022-05-31 ENCOUNTER — Other Ambulatory Visit: Payer: Self-pay | Admitting: Oncology

## 2022-05-31 VITALS — BP 120/64 | HR 63 | Temp 98.3°F | Ht 70.0 in | Wt 219.0 lb

## 2022-05-31 DIAGNOSIS — I480 Paroxysmal atrial fibrillation: Secondary | ICD-10-CM | POA: Diagnosis not present

## 2022-05-31 DIAGNOSIS — L819 Disorder of pigmentation, unspecified: Secondary | ICD-10-CM | POA: Diagnosis not present

## 2022-05-31 DIAGNOSIS — N1831 Chronic kidney disease, stage 3a: Secondary | ICD-10-CM | POA: Diagnosis not present

## 2022-05-31 DIAGNOSIS — I10 Essential (primary) hypertension: Secondary | ICD-10-CM | POA: Diagnosis not present

## 2022-05-31 DIAGNOSIS — C9000 Multiple myeloma not having achieved remission: Secondary | ICD-10-CM

## 2022-05-31 DIAGNOSIS — I5032 Chronic diastolic (congestive) heart failure: Secondary | ICD-10-CM

## 2022-05-31 NOTE — Progress Notes (Signed)
Established Patient Office Visit   Subjective  Patient ID: VONN SLIGER, male    DOB: [birth year]  Age: 87 y.o. MRN: [id number]  Chief Complaint  Patient presents with   Skin Concern    Pt reports dark spot on both lower legs. Noticed it a month ago. Denied itch.  Pt accompanied by his daughter.  Patient with darkened areas on bilateral lower legs x several weeks.  Patient's daughter noticed it earlier this week.  Patient denies pruritus, erythema, edema, lesions on other areas of body.  Patient has not tried anything for symptoms.  Denies any changes in medications or supplements.  Patient notes mild edema in bilateral LEs.  LLE> RLE.  Endorses history of left ankle injury which causes edema.  Sleeping in recliner to elevated legs.  Pt didn't realize he has a hospital bed that reclines until recently.  Pt resides at [facility] but will be moving into an apt next month as it is more cost effective.  Patient has a PET scan next week for multiple myeloma.      Review of Systems  Skin:  Positive for itching.       Dark spots on legs.   Negative unless stated above    Objective:     BP 120/64 (BP Location: Right Arm, Patient Position: Sitting, Cuff Size: Large)   Pulse 63   Temp 98.3 F (36.8 C) (Oral)   Ht '5\' 10"'$  (1.778 m)   Wt 219 lb (99.3 kg)   SpO2 98%   BMI 31.42 kg/m    Physical Exam Constitutional:      Appearance: Normal appearance.  HENT:     Head: Normocephalic and atraumatic.     Nose: Nose normal.     Mouth/Throat:     Mouth: Mucous membranes are moist.  Eyes:     Extraocular Movements: Extraocular movements intact.     Conjunctiva/sclera: Conjunctivae normal.     Pupils: Pupils are equal, round, and reactive to light.  Cardiovascular:     Rate and Rhythm: Normal rate and regular rhythm.     Heart sounds: Normal heart sounds.     Comments: 1+ bilateral LE pitting edema of foot, ankle with trace at mid shin/inferior knee.  LLE mildly greater than  right. Pulmonary:     Effort: Pulmonary effort is normal.  Musculoskeletal:     Right lower leg: 1+ Pitting Edema present.     Left lower leg: 1+ Pitting Edema present.  Skin:    General: Skin is warm and dry.     Comments: Skin of bilateral lower legs dry with hyperpigmented, flat nonblanching, nontender lesions.  Neurological:     Mental Status: He is alert.      No results found for any visits on 05/31/22.    Assessment & Plan:  Hyperpigmentation -Discussed possible causes including venous stasis changes, vasculitis such as HSP though no petichiae or palpable purpura.   -labs from 05/15/22 reviewed.  Hgb 11.9, hct 34.5 RBC 4.09, neutrophil # 1.6 -likely self limiting.   -monitor for spread and new symptoms.  Chronic diastolic congestive heart failure (HCC) -mild LE edema b/l -continue current meds -Elevate LEs, reduce sodium intake, consider compression socks. -Continue follow-up with cardiology -     For home use only DME Eelevated commode seat  Essential hypertension -Controlled -Continue current medications including Lopressor 50 mg twice daily, irbesartan 300 mg daily, Lasix 40 mg daily, diltiazem 240 mg daily -     For home  use only DME Eelevated commode seat  Multiple myeloma, remission status unspecified (HCC) -Stable -PET scan next week -Continue follow-up with oncology -     For home use only DME Eelevated commode seat  Stage 3a chronic kidney disease ([place]) -Stable.  GFR 57 on 05/15/2022  Paroxysmal atrial fibrillation (HCC) -continue eliquis -Continue flecainide, metoprolol -Continue follow-up cardiology  No follow-ups on file.   [name], MD

## 2022-06-02 ENCOUNTER — Encounter: Payer: Self-pay | Admitting: Family Medicine

## 2022-06-05 ENCOUNTER — Encounter (HOSPITAL_COMMUNITY)
Admission: RE | Admit: 2022-06-05 | Discharge: 2022-06-05 | Disposition: A | Discharge status: Home / Self Care | Payer: Medicare Other | Source: Ambulatory Visit | Attending: Hematology | Admitting: Hematology

## 2022-06-05 DIAGNOSIS — C9000 Multiple myeloma not having achieved remission: Secondary | ICD-10-CM | POA: Diagnosis not present

## 2022-06-05 DIAGNOSIS — C9001 Multiple myeloma in remission: Secondary | ICD-10-CM | POA: Diagnosis not present

## 2022-06-05 LAB — GLUCOSE, CAPILLARY: Glucose-Capillary: 102 mg/dL — ABNORMAL HIGH (ref 70–99)

## 2022-06-05 MED ORDER — FLUDEOXYGLUCOSE F - 18 (FDG) INJECTION
10.8000 | Freq: Once | INTRAVENOUS | Status: AC
  Administered 2022-06-05: 10.87 via INTRAVENOUS

## 2022-06-12 ENCOUNTER — Inpatient Hospital Stay (HOSPITAL_BASED_OUTPATIENT_CLINIC_OR_DEPARTMENT_OTHER): Payer: Medicare Other | Admitting: Hematology

## 2022-06-12 ENCOUNTER — Inpatient Hospital Stay: Payer: Medicare Other

## 2022-06-12 ENCOUNTER — Other Ambulatory Visit: Payer: Self-pay

## 2022-06-12 VITALS — BP 132/63 | HR 65 | Temp 97.2°F | Resp 18 | Wt 214.2 lb

## 2022-06-12 DIAGNOSIS — Z7189 Other specified counseling: Secondary | ICD-10-CM

## 2022-06-12 DIAGNOSIS — C9001 Multiple myeloma in remission: Secondary | ICD-10-CM

## 2022-06-12 DIAGNOSIS — C9002 Multiple myeloma in relapse: Secondary | ICD-10-CM | POA: Diagnosis not present

## 2022-06-12 DIAGNOSIS — R918 Other nonspecific abnormal finding of lung field: Secondary | ICD-10-CM | POA: Diagnosis not present

## 2022-06-12 DIAGNOSIS — K8020 Calculus of gallbladder without cholecystitis without obstruction: Secondary | ICD-10-CM | POA: Diagnosis not present

## 2022-06-12 DIAGNOSIS — I10 Essential (primary) hypertension: Secondary | ICD-10-CM | POA: Diagnosis not present

## 2022-06-12 DIAGNOSIS — Z5112 Encounter for antineoplastic immunotherapy: Secondary | ICD-10-CM | POA: Diagnosis not present

## 2022-06-12 DIAGNOSIS — Z87891 Personal history of nicotine dependence: Secondary | ICD-10-CM | POA: Diagnosis not present

## 2022-06-12 LAB — CBC WITH DIFFERENTIAL (CANCER CENTER ONLY)
Abs Immature Granulocytes: 0.01 10*3/uL (ref 0.00–0.07)
Basophils Absolute: 0.1 10*3/uL (ref 0.0–0.1)
Basophils Relative: 2 %
Eosinophils Absolute: 0.2 10*3/uL (ref 0.0–0.5)
Eosinophils Relative: 5 %
HCT: 32.6 % — ABNORMAL LOW (ref 39.0–52.0)
Hemoglobin: 11 g/dL — ABNORMAL LOW (ref 13.0–17.0)
Immature Granulocytes: 0 %
Lymphocytes Relative: 45 %
Lymphs Abs: 2 10*3/uL (ref 0.7–4.0)
MCH: 28.4 pg (ref 26.0–34.0)
MCHC: 33.7 g/dL (ref 30.0–36.0)
MCV: 84.2 fL (ref 80.0–100.0)
Monocytes Absolute: 0.5 10*3/uL (ref 0.1–1.0)
Monocytes Relative: 10 %
Neutro Abs: 1.8 10*3/uL (ref 1.7–7.7)
Neutrophils Relative %: 38 %
Platelet Count: 156 10*3/uL (ref 150–400)
RBC: 3.87 MIL/uL — ABNORMAL LOW (ref 4.22–5.81)
RDW: 15.6 % — ABNORMAL HIGH (ref 11.5–15.5)
WBC Count: 4.6 10*3/uL (ref 4.0–10.5)
nRBC: 0 % (ref 0.0–0.2)

## 2022-06-12 MED ORDER — ACETAMINOPHEN 325 MG PO TABS
650.0000 mg | ORAL_TABLET | Freq: Once | ORAL | Status: AC
  Administered 2022-06-12: 650 mg via ORAL
  Filled 2022-06-12: qty 2

## 2022-06-12 MED ORDER — DEXAMETHASONE 4 MG PO TABS
20.0000 mg | ORAL_TABLET | Freq: Once | ORAL | Status: AC
  Administered 2022-06-12: 20 mg via ORAL
  Filled 2022-06-12: qty 5

## 2022-06-12 MED ORDER — HEPARIN SOD (PORK) LOCK FLUSH 100 UNIT/ML IV SOLN: 500.0000 [IU] | Freq: Once | INTRAVENOUS | Status: DC | PRN

## 2022-06-12 MED ORDER — DIPHENHYDRAMINE HCL 25 MG PO CAPS
50.0000 mg | ORAL_CAPSULE | Freq: Once | ORAL | Status: AC
  Administered 2022-06-12: 50 mg via ORAL
  Filled 2022-06-12: qty 2

## 2022-06-12 MED ORDER — SODIUM CHLORIDE 0.9 % IV SOLN: Freq: Once | INTRAVENOUS | Status: AC

## 2022-06-12 MED ORDER — FAMOTIDINE IN NACL 20-0.9 MG/50ML-% IV SOLN
20.0000 mg | Freq: Once | INTRAVENOUS | Status: AC
  Administered 2022-06-12: 20 mg via INTRAVENOUS
  Filled 2022-06-12: qty 50

## 2022-06-12 MED ORDER — SODIUM CHLORIDE 0.9% FLUSH: 10.0000 mL | INTRAVENOUS | Status: DC | PRN

## 2022-06-12 MED ORDER — SODIUM CHLORIDE 0.9 % IV SOLN
16.0000 mg/kg | Freq: Once | INTRAVENOUS | Status: AC
  Administered 2022-06-12: 1500 mg via INTRAVENOUS
  Filled 2022-06-12: qty 15

## 2022-06-12 NOTE — Progress Notes (Signed)
HEMATOLOGY/ONCOLOGY CLINIC NOTE  Date of Service: 06/12/22   Patient Care Team: [name], MD as PCP - General (Family Medicine) [name], MD (Cardiology) [name], MD (Cardiology) [name], DO as Consulting Physician (Neurology)  CHIEF COMPLAINTS/PURPOSE OF CONSULTATION:  Follow-up for continued evaluation and management of multiple myeloma  HISTORY OF PRESENTING ILLNESS:  Please see previous note for details on initial presentation.  INTERVAL HISTORY:   Jonathon [name] is a 87 y.o. male here for continued evaluation and management of multiple myeloma.  Patient was last seen by me on 05/15/2022 and he complained of lower back pain that radiated down his right leg and new lump on his lower back.  Patient complains of right leg weakness which started around 2 weeks ago and it worsens when he lays down. He describes the pain has dull pain. Patient also complains of back weakness, a lump near his lower back, and a new lump near his right shoulder. He notes that the lump near his lower back has been there for couple of months. Patient reports that lump on his back and his right shoulders itch, so he has been applying hydrocortisone cream.   He denies fever, chills, night sweats, chest pain, abdominal pain, or leg swelling. Patient complains of right knee pain when he bends his knee and complains of lower back pain when he moves his right leg.   Patient notes that he is leaving his care facility and is now going to live independently in his apartment.   He has received influenza vaccine, COVID-19 Booster, and RSV vaccine.    MEDICAL HISTORY:  Past Medical History:  Diagnosis Date   Asthma    BENIGN PROSTATIC HYPERTROPHY, HX OF 10/25/2008   Bone metastases 04/14/2018   BRADYCARDIA 2005   Chronic diastolic congestive heart failure ([place]) 06/05/2016   CIDP (chronic inflammatory demyelinating polyneuropathy) ([place])    HYPERTENSION 11/21/2006   Iron deficiency  anemia, unspecified 04/19/2013   Multiple myeloma ([place]) 04/29/2018   NEPHROLITHIASIS, HX OF 11/21/2006 and 10/15/2008   PACEMAKER, PERMANENT 2005   Gen change 2014 Medtronic Adaptic L dual-chamber pacemaker, serial #[id number]    SVT (supraventricular tachycardia)    TOBACCO ABUSE 10/25/2008   Quit 2012    SURGICAL HISTORY: Past Surgical History:  Procedure Laterality Date   COLONOSCOPY  2004,2009   PACEMAKER INSERTION  05/14/2003   PERMANENT PACEMAKER GENERATOR CHANGE N/A 06/19/2012   Procedure: PERMANENT PACEMAKER GENERATOR CHANGE;  Surgeon: [name], MD; Medtronic Adaptic L dual-chamber pacemaker, serial #[id number]     SKIN GRAFT Right 05/13/1960   wrist   TOOTH EXTRACTION  11/10/2020   3 teeth removed    SOCIAL HISTORY: Social History   Socioeconomic History   Marital status: Divorced    Spouse name: Not on file   Number of children: 4   Years of education: Not on file   Highest education level: Not on file  Occupational History   Occupation: ENVIROMENTAL SERVICE    Employer: [employer]   Occupation: retired  Tobacco Use   Smoking status: Former    Packs/day: 0.25    Years: 46.00    Total pack years: 11.50    Types: Cigarettes    Start date: 03/16/1965    Quit date: 06/15/2010    Years since quitting: 12.0   Smokeless tobacco: Never   Tobacco comments:    Former smoker 10/11/21 quit 3 years ago  Vaping Use   Vaping Use:  Never used  Substance and Sexual Activity   Alcohol use: Yes    Alcohol/week: 6.0 - 10.0 standard drinks of alcohol    Types: 6 - 10 Glasses of wine per week    Comment: 1-2 glasses every weekend 10/11/21   Drug use: No   Sexual activity: Not on file  Other Topics Concern   Not on file  Social History Narrative   Has relocated from [place] in 2007. 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  Patient in a facility thru [facility]   Social Determinants of Health   Financial Resource Strain: Low Risk  (02/07/2022)   Overall Financial Resource Strain  (CARDIA)    Difficulty of Paying Living Expenses: Not hard at all  Food Insecurity: No Food Insecurity (02/28/2022)   Hunger Vital Sign    Worried About Running Out of Food in the Last Year: Never true    Ran Out of Food in the Last Year: Never true  Transportation Needs: No Transportation Needs (02/07/2022)   PRAPARE - Hydrologist (Medical): No    Lack of Transportation (Non-Medical): No  Physical Activity: Sufficiently Active (02/07/2022)   Exercise Vital Sign    Days of Exercise per Week: 7 days    Minutes of Exercise per Session: 50 min  Stress: No Stress Concern Present (02/07/2022)   [place]    Feeling of Stress : Not at all  Social Connections: Socially Isolated (02/07/2022)   Social Connection and Isolation Panel [NHANES]    Frequency of Communication with Friends and Family: More than three times a week    Frequency of Social Gatherings with Friends and Family: More than three times a week    Attends Religious Services: Never    Marine scientist or Organizations: No    Attends Archivist Meetings: Never    Marital Status: Divorced  Human resources officer Violence: Not At Risk (02/07/2022)   Humiliation, Afraid, Rape, and Kick questionnaire    Fear of Current or Ex-Partner: No    Emotionally Abused: No    Physically Abused: No    Sexually Abused: No    FAMILY HISTORY: Family History  Problem Relation Age of Onset   Heart attack Father        Died, 19   Kidney disease Father    Parkinson's disease Mother        Died, 21   Breast cancer Sister        Living, 53   Diabetes type II Brother        Living, 22   Breast cancer Sister        Living, 63   Diabetes Daughter        Living, 55   Diabetes Son        Living, 13   Ovarian cancer Daughter    Colon cancer Neg Hx    Rectal cancer Neg Hx    Stomach cancer Neg Hx     ALLERGIES:  is allergic to lexapro  [escitalopram oxalate].  MEDICATIONS:  Current Outpatient Medications  Medication Sig Dispense Refill   apixaban (ELIQUIS) 5 MG TABS tablet Take 1 tablet (5 mg total) by mouth 2 (two) times daily. 60 tablet 11   colchicine 0.6 MG tablet Take 2 tabs (1.2 mg) now.  Then take 1 tab (0.6 mg) 1 hour later.  Then take 1 tab daily until gout flare resolves. 14 tablet 0   diltiazem (TIAZAC) 240  MG 24 hr capsule Take 1 capsule by mouth daily.     flecainide (TAMBOCOR) 150 MG tablet Take 0.5 tablets (75 mg total) by mouth 2 (two) times daily. 90 tablet 3   furosemide (LASIX) 40 MG tablet Take 40 mg by mouth daily.     irbesartan (AVAPRO) 300 MG tablet Take 300 mg by mouth daily.     metoprolol tartrate (LOPRESSOR) 50 MG tablet Take 1 tablet (50 mg total) by mouth 2 (two) times daily. 60 tablet 0   polyethylene glycol (MIRALAX / GLYCOLAX) packet Take 17 g by mouth daily. 14 each 0   REVLIMID 5 MG capsule TAKE 1 CAPSULE BY MOUTH 1 TIME DAILY 21 capsule 0   senna-docusate (SENOKOT-S) 8.6-50 MG tablet Take 2 tablets by mouth 2 (two) times daily as needed for mild constipation. 30 tablet 0   tamsulosin (FLOMAX) 0.4 MG CAPS capsule Take 2 capsules (0.8 mg total) by mouth daily. 90 capsule 0   traMADol (ULTRAM) 50 MG tablet Take 1 tablet (50 mg total) by mouth every 6 (six) hours as needed. 30 tablet 0   No current facility-administered medications for this visit.   Facility-Administered Medications Ordered in Other Visits  Medication Dose Route Frequency Provider Last Rate Last Admin   [MAR Hold] sodium chloride flush (NS) 0.9 % injection 10 mL  10 mL Intracatheter PRN Jonathon Genera, MD        REVIEW OF SYSTEMS:   10 Point review of Systems was done is negative except as noted above.  PHYSICAL EXAMINATION: ECOG PERFORMANCE STATUS: 1 - Symptomatic but completely ambulatory  NAD GENERAL:alert, in no acute distress and comfortable SKIN: no acute rashes, no significant lesions EYES: conjunctiva  are pink and non-injected, sclera anicteric NECK: supple, no JVD LYMPH:  no palpable lymphadenopathy in the cervical regions LUNGS: clear to auscultation b/l with normal respiratory effort HEART: regular rate & rhythm Extremity: no pedal edema PSYCH: alert & oriented x 3 with fluent speech NEURO: no focal motor/sensory deficits  LABORATORY DATA:  I have reviewed the data as listed .    Latest Ref Rng & Units 06/12/2022   11:37 AM 05/15/2022   10:24 AM 04/17/2022   10:15 AM  CBC  WBC 4.0 - 10.5 K/uL 4.6  4.2  3.5   Hemoglobin 13.0 - 17.0 g/dL 11.0  11.7  11.7   Hematocrit 39.0 - 52.0 % 32.6  34.5  34.8   Platelets 150 - 400 K/uL 156  158  173        Latest Ref Rng & Units 05/15/2022    1:51 PM 03/20/2022    9:45 AM 01/23/2022   10:11 AM  CMP  Glucose 70 - 99 mg/dL 102  104  119   BUN 8 - 23 mg/dL '15  16  19   '$ Creatinine 0.61 - 1.24 mg/dL 1.25  1.29  1.21   Sodium 135 - 145 mmol/L 135  139  138   Potassium 3.5 - 5.1 mmol/L 4.0  4.2  3.9   Chloride 98 - 111 mmol/L 103  106  105   CO2 22 - 32 mmol/L '24  27  28   '$ Calcium 8.9 - 10.3 mg/dL 8.9  9.3  9.4   Total Protein 6.5 - 8.1 g/dL 6.6  6.6  6.3   Total Bilirubin 0.3 - 1.2 mg/dL 0.7  0.5  0.5   Alkaline Phos 38 - 126 U/L 119  149  143   AST 15 - 41  U/L '16  15  18   '$ ALT 0 - 44 U/L '9  9  15       '$ 04/17/18 BM Bx:    RADIOGRAPHIC STUDIES: I have personally reviewed the radiological images as listed and agreed with the findings in the report. NM PET Image Restage (PS) Whole Body  Result Date: 06/05/2022 CLINICAL DATA:  Subsequent treatment strategy for multiple myeloma. Back pain. EXAM: NUCLEAR MEDICINE PET WHOLE BODY TECHNIQUE: 10.9 mCi F-18 FDG was injected intravenously. Full-ring PET imaging was performed from the head to foot after the radiotracer. CT data was obtained and used for attenuation correction and anatomic localization. Fasting blood glucose: 103 mg/dl COMPARISON:  PET-CT scans 08/06/2021, 02/06/2021 and  05/03/2018. FINDINGS: Mediastinal blood pool activity: SUV max 2.8 HEAD/NECK: Large soft tissue mass in the posterior paraspinal RIGHT upper back measures 8.3 x 3.7 cm with intense metabolic activity with SUV max equal 13.0. This mass is position between the scapula vertebral body at the cervicothoracic junction. Mass is new from most recent FDG PET scan however the similar mass on more remote PET scan from 02/06/2021. Incidental CT findings: none CHEST: Hypermetabolic nodules along the parenchymal thickening in the medial RIGHT upper lobe with SUV max equal 7.6. Several hypermetabolic nodules in the periphery of the LEFT lower lobe with SUV max equal 6.3 on image 92) these nodules measure 1 cm each. There are hypermetabolic nodules within the musculature superficial to the LEFT and RIGHT scapula without clear CT correlation. Subcutaneous nodule in the RIGHT back measuring 2 cm with SUV max equal 14.0. Similar hypermetabolic nodules in the RIGHT pectoralis muscle. Incidental CT findings: none ABDOMEN/PELVIS: Single hypermetabolic focus in the LEFT lobe liver with SUV max equal 5.3 significant above background liver activity. Incidental CT findings: none SKELETON: Multiple foci of intense radiotracer activity within the muscles and subcutaneous tissue. Example midline lesion superficial to the L5 vertebral body with SUV max equal 10.8 measures 2 cm. Minimal skeletal disease. However, lesion in the anterior LEFT upper rib is expansile and hypermetabolic measuring 2 cm SUV max equal 13.4 on image 91. Incidental CT findings: none EXTREMITIES: Hypermetabolic muscular metastasis in the medial distal LEFT thigh with SUV max equal 7.8. Incidental CT findings: none IMPRESSION: 1. 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 2. Bulky soft tissue mass in the high RIGHT neck between the scapula and vertebral bodies is intensely hypermetabolic consistent with  plasmacytoma. Lesion new from most recent PET-CT scan but recurrent from PET-CT scan September 2022. 3. New hypermetabolic nodules in the RIGHT upper lobe and LEFT lower lobe. 4. Multiple muscular and subcutaneous lesions throughout the chest abdomen and pelvis. 5. Expansile skeletal lesion anterior LEFT rib. 6. Single hypermetabolic lesion in the liver consistent with hepatic metastasis. Electronically Signed   By: Suzy Bouchard M.D.   On: 06/05/2022 16:25    ASSESSMENT & PLAN:   87 y.o. male with  1.  Relapsed multiple myeloma multiple bone metastases   labs upon initial presentation from 04/14/18; hypercalcemic with Calcium at 13.8, renal function up form baseline with Creatinine at 1.48, HGB at 11.3. PSA was normal.   03/31/18 CT C/A/P revealed 1. Lytic lesion throughout all visualized vertebra from the lower cervical spine through the sacrum. Epidural extension of tumor T2, T3 and L2 level. Right L3-4 foraminal extension tumor. 2. Lytic lesions involving majority of ribs. Multiple pathologic rib fractures with bony expansion/sub pleural extension of tumor at several levels. 3. Lytic  lesions of the scapula, hips bilaterally and pelvis bilaterally. With further progression of tumor, patient may be at risk for pathologic fractures. Pathologic fracture right clavicle incompletely assessed. 4. Circumferential narrowing sigmoid colon. Question possibility of underlying mass (series 2, image 97). Alternatively this could represent muscular hypertrophy/peristalsis. 5. Gastric antrum circumferential narrowing. Cannot exclude mass although this may be related to peristalsis. 6. No primary lung malignancy noted. 7. Matted aortic pulmonary window lymph nodes. 8. Circumferential urinary bladder wall thickening greater anterior dome region. Right lateral bladder diverticulum. 9. 5 mm low-density lesion pancreatic body (series 5, image 10) unchanged. This is felt unlikely to be a primary malignancy. 10. Right  parapelvic 2.2 cm cyst without significant change. Scattered low-density renal lesions bilaterally too small to adequately characterize. Some were present previously. Statistically these are likely cysts. 11. Stable adrenal gland hyperplasia. 12. Gynecomastia. 13. Prostate gland causes slight impression upon the bladder base. 14.  Aortic Atherosclerosis. 15. Sequential pacemaker in place. 16. Gallstones.   04/23/18 PET/CT revealed Innumerable hypermetabolic lytic lesions throughout the skeleton especially involving the spine, ribs, bony pelvis along with some involvement of both proximal femurs. The appearance is compatible with pathologic diagnosis of plasma cell neoplasm could well reflect multiple myeloma. Resulting pathologic fractures of the right lateral clavicle and of multiple bilateral ribs. Lesions are present along the cortical margins of the spinal canal. 2. Both the area and the stomach in the area in the sigmoid colon drawn attention to on prior CT scan appear benign normal today. 3. The confluence of AP window lymph nodes measures about 1.2 cm in short axis with maximum SUV 3.1, which is mildly above the blood pool merit surveillance. 4. Other imaging findings of potential clinical significance: Aortic Atherosclerosis. Coronary atherosclerosis. Pacemaker noted. Borderline prostatomegaly. Cholelithiasis.  04/21/18 BM Bx revealed Normocellular bone marrow with Plasma Cell neoplasm. Scattered medium sized clusters of kappa-restricted plasma cells (14% aspirate, 10% CD138 immunohistochemistry.   I do suspect that the burden of disease is underestimated in the bone marrow sample given PET/CT findings of innumerable lytic lesions and an M spike of 3.4g   06/08/18 DG Hips bilat which revealed Innumerable lucencies are noted throughout the pelvis and proximal femurs, similar findings noted on prior PET-CT of 04/23/2018. Findings are consistent patient's known multiple myeloma. Femoral necks are intact.  2.  Aortoiliac atherosclerotic vascular disease.  08/06/2021 PET/CT revealed "1. Interval development of consolidative masslike opacities in both upper lobes demonstrating marked hypermetabolism. While these may be infectious/inflammatory, FDG accumulation is higher than typically seen in that setting. Metastatic disease not excluded. 2. Dominant left rib, right scapula, and left sacral lesions all show marked interval decrease in size and hypermetabolism. 3. Soft tissue nodule in the midline back adjacent to the L4 spinous process is similar in size and hypermetabolism today. 4. Interval decrease in size and hypermetabolism in the gastrohepatic lymph node identified previously. 5. Multiple scattered osseous lesions compatible with treated multiple myeloma. 6. Cholelithiasis."   2.  Pulmonary infiltrates  --Likely related to radiation related change.  PLAN:    -Discussed lab results from today, 06/12/2022, with the patient. CBC showed hemoglobin of 11.0 and hematocrit of 32.6. CMP showed elevated creatinine at 1.25. -Multiple myeloma panel on 05/15/2022 showed M-protein of 0.2. PET/CT- from 06/05/2022 shows broad progression of his multiple myeloma -will need to switch to Isatuximab +Pomalidomide in light of disease progression.  FOLLOW-UP: Switch to Isatuximab+Pomalidomide due to progression  The total time spent in the appointment was 32 minutes* .  All of the patient's questions were answered with apparent satisfaction. The patient knows to call the clinic with any problems, questions or concerns.   Jonathon Lone MD MS AAHIVMS [facility] Hematology/Oncology Physician [facility]  .*Total Encounter Time as defined by the Centers for Medicare and Medicaid Services includes, in addition to the face-to-face time of a patient visit (documented in the note above) non-face-to-face time: obtaining and reviewing outside history, ordering and reviewing medications, tests or  procedures, care coordination (communications with other health care professionals or caregivers) and documentation in the medical record.   I, Jonathon [name], am acting as a Education administrator for Jonathon Lone, MD. .I have reviewed the above documentation for accuracy and completeness, and I agree with the above. Jonathon Genera MD

## 2022-06-12 NOTE — Patient Instructions (Signed)
[facility]  Discharge Instructions: Thank you for choosing [facility] to provide your oncology and hematology care.   If you have a lab appointment with the Cancer Center, please go directly to the Cancer Center and check in at the registration area.   Wear comfortable clothing and clothing appropriate for easy access to any Portacath or PICC line.   We strive to give you quality time with your provider. You may need to reschedule your appointment if you arrive late (15 or more minutes).  Arriving late affects you and other patients whose appointments are after yours.  Also, if you miss three or more appointments without notifying the office, you may be dismissed from the clinic at the provider's discretion.      For prescription refill requests, have your pharmacy contact our office and allow 72 hours for refills to be completed.    Today you received the following chemotherapy and/or immunotherapy agents: Daratumumab.       To help prevent nausea and vomiting after your treatment, we encourage you to take your nausea medication as directed.  BELOW ARE SYMPTOMS THAT SHOULD BE REPORTED IMMEDIATELY: *FEVER GREATER THAN 100.4 F (38 C) OR HIGHER *CHILLS OR SWEATING *NAUSEA AND VOMITING THAT IS NOT CONTROLLED WITH YOUR NAUSEA MEDICATION *UNUSUAL SHORTNESS OF BREATH *UNUSUAL BRUISING OR BLEEDING *URINARY PROBLEMS (pain or burning when urinating, or frequent urination) *BOWEL PROBLEMS (unusual diarrhea, constipation, pain near the anus) TENDERNESS IN MOUTH AND THROAT WITH OR WITHOUT PRESENCE OF ULCERS (sore throat, sores in mouth, or a toothache) UNUSUAL RASH, SWELLING OR PAIN  UNUSUAL VAGINAL DISCHARGE OR ITCHING   Items with * indicate a potential emergency and should be followed up as soon as possible or go to the Emergency Department if any problems should occur.  Please show the CHEMOTHERAPY ALERT CARD or IMMUNOTHERAPY ALERT CARD at  check-in to the Emergency Department and triage nurse.  Should you have questions after your visit or need to cancel or reschedule your appointment, please contact [facility]  Dept: [phone]  and follow the prompts.  Office hours are 8:00 a.m. to 4:30 p.m. Monday - Friday. Please note that voicemails left after 4:00 p.m. may not be returned until the following business day.  We are closed weekends and major holidays. You have access to a nurse at all times for urgent questions. Please call the main number to the clinic Dept: [phone] and follow the prompts.   For any non-urgent questions, you may also contact your provider using MyChart. We now offer e-Visits for anyone 18 and older to request care online for non-urgent symptoms. For details visit [email or web address].   Also download the MyChart app! Go to the app store, search "MyChart", open the app, select [place], and log in with your MyChart username and password.   

## 2022-06-12 NOTE — Progress Notes (Signed)
Patient seen by MD today  Vitals are within treatment parameters.  Labs reviewed: and are within treatment parameters."  Per physician team, patient is ready for treatment and there are NO modifications to the treatment plan.

## 2022-06-17 ENCOUNTER — Telehealth: Payer: Self-pay | Admitting: Internal Medicine

## 2022-06-17 ENCOUNTER — Telehealth: Payer: Self-pay | Admitting: Family Medicine

## 2022-06-17 ENCOUNTER — Other Ambulatory Visit: Payer: Self-pay

## 2022-06-17 DIAGNOSIS — I10 Essential (primary) hypertension: Secondary | ICD-10-CM

## 2022-06-17 MED ORDER — FLECAINIDE ACETATE 150 MG PO TABS: 75.0000 mg | ORAL_TABLET | Freq: Two times a day (BID) | ORAL | 3 refills | Status: DC

## 2022-06-17 MED ORDER — METOPROLOL TARTRATE 50 MG PO TABS: 50.0000 mg | ORAL_TABLET | Freq: Two times a day (BID) | ORAL | 3 refills | Status: DC

## 2022-06-17 MED ORDER — TRAMADOL HCL 50 MG PO TABS: 50.0000 mg | ORAL_TABLET | Freq: Four times a day (QID) | ORAL | 0 refills | Status: DC | PRN

## 2022-06-17 MED ORDER — APIXABAN 5 MG PO TABS: 5.0000 mg | ORAL_TABLET | Freq: Two times a day (BID) | ORAL | 11 refills | Status: DC

## 2022-06-17 MED ORDER — DILTIAZEM HCL ER BEADS 240 MG PO CP24: 240.0000 mg | ORAL_CAPSULE | Freq: Every day | ORAL | 3 refills | Status: DC

## 2022-06-17 MED ORDER — APIXABAN 5 MG PO TABS: 5.0000 mg | ORAL_TABLET | Freq: Two times a day (BID) | ORAL | 1 refills | Status: DC

## 2022-06-17 MED ORDER — FUROSEMIDE 40 MG PO TABS: 40.0000 mg | ORAL_TABLET | Freq: Every day | ORAL | 3 refills | Status: DC

## 2022-06-17 NOTE — Telephone Encounter (Signed)
Prescription refill request for Eliquis received. Indication:afib Last office visit:11/23 Scr:1.21/24 Age: 87 Weight:97.2  kg  Prescription refilled

## 2022-06-17 NOTE — Telephone Encounter (Signed)
*  STAT* If patient is at the pharmacy, call can be transferred to refill team.   1. Which medications need to be refilled? (please list name of each medication and dose if known)   apixaban (ELIQUIS) 5 MG TABS tablet  diltiazem (TIAZAC) 240 MG 24 hr capsule  flecainide (TAMBOCOR) 150 MG tablet  metoprolol tartrate (LOPRESSOR) 50 MG tablet  furosemide (LASIX) 40 MG tablet    2. Which pharmacy/location (including street and city if local pharmacy) is medication to be sent to? [id number] [place], [place] - [address]   3. Do they need a 30 day or 90 day supply? 90 day

## 2022-06-17 NOTE — Telephone Encounter (Signed)
Pt's daughter called with questions/concerns regarding some of Pt's medications and would like a call back to discuss. Daughter did not say which medications, but wanted to ask CMA some questions, stating medication was prescribed by another doctor and would rather just speak to her.

## 2022-06-17 NOTE — Telephone Encounter (Signed)
Pt's medications were sent to pt's pharmacy as requested. Confirmation received.  

## 2022-06-18 NOTE — Telephone Encounter (Signed)
Pt need refills on irbesartan (AVAPRO) 300 MG tablet, tamsulosin (FLOMAX) 0.4 MG CAPS capsule  and namenda 5 mg #90 w/refills . The last  2 medications  was prescribed by dr Gwyndolyn Saxon hopper  [id number] [place] [place]: [phone] Fax: [phone]

## 2022-06-18 NOTE — Progress Notes (Incomplete)
HEMATOLOGY/ONCOLOGY CLINIC NOTE  Date of Service: 06/12/22   Patient Care Team: [name], MD as PCP - General (Family Medicine) [name], MD (Cardiology) [name], MD (Cardiology) [name], DO as Consulting Physician (Neurology)  CHIEF COMPLAINTS/PURPOSE OF CONSULTATION:  Follow-up for continued evaluation and management of multiple myeloma  HISTORY OF PRESENTING ILLNESS:  Please see previous note for details on initial presentation.  INTERVAL HISTORY:   Jonathon [name] is a 87 y.o. male here for continued evaluation and management of multiple myeloma.  Patient was last seen by me on 05/15/2022 and he complained of lower back pain that radiated down his right leg and new lump on his lower back.  Patient complains of right leg weakness which started around 2 weeks ago and it worsens when he lays down. He describes the pain has dull pain. Patient also complains of back weakness, a lump near his lower back, and a new lump near his right shoulder. He notes that the lump near his lower back has been there for couple of months. Patient reports that lump on his back and his right shoulders itch, so he has been applying hydrocortisone cream.   He denies fever, chills, night sweats, chest pain, abdominal pain, or leg swelling. Patient complains of right knee pain when he bends his knee and complains of lower back pain when he moves his right leg.   Patient notes that he is leaving his care facility and is now going to live independently in his apartment.   He has received influenza vaccine, COVID-19 Booster, and RSV vaccine.    MEDICAL HISTORY:  Past Medical History:  Diagnosis Date  . Asthma   . BENIGN PROSTATIC HYPERTROPHY, HX OF 10/25/2008  . Bone metastases 04/14/2018  . BRADYCARDIA 2005  . Chronic diastolic congestive heart failure ([employer]) 06/05/2016  . CIDP (chronic inflammatory demyelinating polyneuropathy) ([place])   . HYPERTENSION 11/21/2006  . Iron  deficiency anemia, unspecified 04/19/2013  . Multiple myeloma ([place]) 04/29/2018  . 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 PACEMAKER, PERMANENT 2005   Gen change 2014 Medtronic Adaptic L dual-chamber pacemaker, serial [id number]   . SVT (supraventricular tachycardia)   . TOBACCO ABUSE 10/25/2008   Quit 2012    SURGICAL HISTORY: Past Surgical History:  Procedure Laterality Date  . COLONOSCOPY  [id number]  . PACEMAKER INSERTION  05/14/2003  . PERMANENT PACEMAKER GENERATOR CHANGE N/A 06/19/2012   Procedure: PERMANENT PACEMAKER GENERATOR CHANGE;  Surgeon: [name], MD; Medtronic Adaptic L dual-chamber pacemaker, serial #[id number]    . SKIN GRAFT Right 05/13/1960   wrist  . TOOTH EXTRACTION  11/10/2020   3 teeth removed    SOCIAL HISTORY: Social History   Socioeconomic History  . Marital status: Divorced    Spouse name: Not on file  . Number of children: 4  . Years of education: Not on file  . Highest education level: Not on file  Occupational History  . Occupation: ENVIROMENTAL Location manager: [employer]  . Occupation: retired  Tobacco Use  . Smoking status: Former    Packs/day: 0.25    Years: 46.00    Total pack years: 11.50    Types: Cigarettes    Start date: 03/16/1965    Quit date: 06/15/2010    Years since quitting: 12.0  . Smokeless tobacco: Never  . Tobacco comments:    Former smoker 10/11/21 quit 3 years ago  Vaping Use  . Vaping Use:  Never used  Substance and Sexual Activity  . Alcohol use: Yes    Alcohol/week: 6.0 - 10.0 standard drinks of alcohol    Types: 6 - 10 Glasses of wine per week    Comment: 1-2 glasses every weekend 10/11/21  . Drug use: No  . Sexual activity: Not on file  Other Topics Concern  . Not on file  Social History Narrative   Has relocated from [place] in 2007. 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  Patient in a facility thru [facility]   Social Determinants of Health   Financial Resource Strain: Low Risk  (02/07/2022)    Overall Financial Resource Strain (CARDIA)   . Difficulty of Paying Living Expenses: Not hard at all  Food Insecurity: No Food Insecurity (02/28/2022)   Hunger Vital Sign   . Worried About Charity fundraiser in the Last Year: Never true   . Ran Out of Food in the Last Year: Never true  Transportation Needs: No Transportation Needs (02/07/2022)   PRAPARE - Transportation   . Lack of Transportation (Medical): No   . Lack of Transportation (Non-Medical): No  Physical Activity: Sufficiently Active (02/07/2022)   Exercise Vital Sign   . Days of Exercise per Week: 7 days   . Minutes of Exercise per Session: 50 min  Stress: No Stress Concern Present (02/07/2022)   [place]   . Feeling of Stress : Not at all  Social Connections: Socially Isolated (02/07/2022)   Social Connection and Isolation Panel [NHANES]   . Frequency of Communication with Friends and Family: More than three times a week   . Frequency of Social Gatherings with Friends and Family: More than three times a week   . Attends Religious Services: Never   . Active Member of Clubs or Organizations: No   . Attends Archivist Meetings: Never   . Marital Status: Divorced  Human resources officer Violence: Not At Risk (02/07/2022)   Humiliation, Afraid, Rape, and Kick questionnaire   . Fear of Current or Ex-Partner: No   . Emotionally Abused: No   . Physically Abused: No   . Sexually Abused: No    FAMILY HISTORY: Family History  Problem Relation Age of Onset  . Heart attack Father        Died, 49  . Kidney disease Father   . Parkinson's disease Mother        Died, 63  . Breast cancer Sister        Living, 32  . Diabetes type II Brother        Living, 61  . Breast cancer Sister        Living, 56  . Diabetes Daughter        Living, 3  . Diabetes Son        Living, 49  . Ovarian cancer Daughter   . Colon cancer Neg Hx   . Rectal cancer Neg Hx   .  Stomach cancer Neg Hx     ALLERGIES:  is allergic to lexapro [escitalopram oxalate].  MEDICATIONS:  Current Outpatient Medications  Medication Sig Dispense Refill  . apixaban (ELIQUIS) 5 MG TABS tablet Take 1 tablet (5 mg total) by mouth 2 (two) times daily. 60 tablet 11  . colchicine 0.6 MG tablet Take 2 tabs (1.2 mg) now.  Then take 1 tab (0.6 mg) 1 hour later.  Then take 1 tab daily until gout flare resolves. 14 tablet 0  . diltiazem (TIAZAC) 240  MG 24 hr capsule Take 1 capsule by mouth daily.    . flecainide (TAMBOCOR) 150 MG tablet Take 0.5 tablets (75 mg total) by mouth 2 (two) times daily. 90 tablet 3  . furosemide (LASIX) 40 MG tablet Take 40 mg by mouth daily.    . irbesartan (AVAPRO) 300 MG tablet Take 300 mg by mouth daily.    . metoprolol tartrate (LOPRESSOR) 50 MG tablet Take 1 tablet (50 mg total) by mouth 2 (two) times daily. 60 tablet 0  . polyethylene glycol (MIRALAX / GLYCOLAX) packet Take 17 g by mouth daily. 14 each 0  . REVLIMID 5 MG capsule TAKE 1 CAPSULE BY MOUTH 1 TIME DAILY 21 capsule 0  . senna-docusate (SENOKOT-S) 8.6-50 MG tablet Take 2 tablets by mouth 2 (two) times daily as needed for mild constipation. 30 tablet 0  . tamsulosin (FLOMAX) 0.4 MG CAPS capsule Take 2 capsules (0.8 mg total) by mouth daily. 90 capsule 0  . traMADol (ULTRAM) 50 MG tablet Take 1 tablet (50 mg total) by mouth every 6 (six) hours as needed. 30 tablet 0   No current facility-administered medications for this visit.   Facility-Administered Medications Ordered in Other Visits  Medication Dose Route Frequency Provider Last Rate Last Admin  . [MAR Hold] sodium chloride flush (NS) 0.9 % injection 10 mL  10 mL Intracatheter PRN Brunetta Genera, MD        REVIEW OF SYSTEMS:   10 Point review of Systems was done is negative except as noted above.  PHYSICAL EXAMINATION: ECOG PERFORMANCE STATUS: 1 - Symptomatic but completely ambulatory  NAD GENERAL:alert, in no acute distress and  comfortable SKIN: no acute rashes, no significant lesions EYES: conjunctiva are pink and non-injected, sclera anicteric NECK: supple, no JVD LYMPH:  no palpable lymphadenopathy in the cervical regions LUNGS: clear to auscultation b/l with normal respiratory effort HEART: regular rate & rhythm Extremity: no pedal edema PSYCH: alert & oriented x 3 with fluent speech NEURO: no focal motor/sensory deficits  LABORATORY DATA:  I have reviewed the data as listed .    Latest Ref Rng & Units 06/12/2022   11:37 AM 05/15/2022   10:24 AM 04/17/2022   10:15 AM  CBC  WBC 4.0 - 10.5 K/uL 4.6  4.2  3.5   Hemoglobin 13.0 - 17.0 g/dL 11.0  11.7  11.7   Hematocrit 39.0 - 52.0 % 32.6  34.5  34.8   Platelets 150 - 400 K/uL 156  158  173        Latest Ref Rng & Units 05/15/2022    1:51 PM 03/20/2022    9:45 AM 01/23/2022   10:11 AM  CMP  Glucose 70 - 99 mg/dL 102  104  119   BUN 8 - 23 mg/dL '15  16  19   '$ Creatinine 0.61 - 1.24 mg/dL 1.25  1.29  1.21   Sodium 135 - 145 mmol/L 135  139  138   Potassium 3.5 - 5.1 mmol/L 4.0  4.2  3.9   Chloride 98 - 111 mmol/L 103  106  105   CO2 22 - 32 mmol/L '24  27  28   '$ Calcium 8.9 - 10.3 mg/dL 8.9  9.3  9.4   Total Protein 6.5 - 8.1 g/dL 6.6  6.6  6.3   Total Bilirubin 0.3 - 1.2 mg/dL 0.7  0.5  0.5   Alkaline Phos 38 - 126 U/L 119  149  143   AST 15 - 41  U/L '16  15  18   '$ ALT 0 - 44 U/L '9  9  15       '$ 04/17/18 BM Bx:    RADIOGRAPHIC STUDIES: I have personally reviewed the radiological images as listed and agreed with the findings in the report. NM PET Image Restage (PS) Whole Body  Result Date: 06/05/2022 CLINICAL DATA:  Subsequent treatment strategy for multiple myeloma. Back pain. EXAM: NUCLEAR MEDICINE PET WHOLE BODY TECHNIQUE: 10.9 mCi F-18 FDG was injected intravenously. Full-ring PET imaging was performed from the head to foot after the radiotracer. CT data was obtained and used for attenuation correction and anatomic localization. Fasting blood  glucose: 103 mg/dl COMPARISON:  PET-CT scans 08/06/2021, 02/06/2021 and 05/03/2018. FINDINGS: Mediastinal blood pool activity: SUV max 2.8 HEAD/NECK: Large soft tissue mass in the posterior paraspinal RIGHT upper back measures 8.3 x 3.7 cm with intense metabolic activity with SUV max equal 13.0. This mass is position between the scapula vertebral body at the cervicothoracic junction. Mass is new from most recent FDG PET scan however the similar mass on more remote PET scan from 02/06/2021. Incidental CT findings: none CHEST: Hypermetabolic nodules along the parenchymal thickening in the medial RIGHT upper lobe with SUV max equal 7.6. Several hypermetabolic nodules in the periphery of the LEFT lower lobe with SUV max equal 6.3 on image 92) these nodules measure 1 cm each. There are hypermetabolic nodules within the musculature superficial to the LEFT and RIGHT scapula without clear CT correlation. Subcutaneous nodule in the RIGHT back measuring 2 cm with SUV max equal 14.0. Similar hypermetabolic nodules in the RIGHT pectoralis muscle. Incidental CT findings: none ABDOMEN/PELVIS: Single hypermetabolic focus in the LEFT lobe liver with SUV max equal 5.3 significant above background liver activity. Incidental CT findings: none SKELETON: Multiple foci of intense radiotracer activity within the muscles and subcutaneous tissue. Example midline lesion superficial to the L5 vertebral body with SUV max equal 10.8 measures 2 cm. Minimal skeletal disease. However, lesion in the anterior LEFT upper rib is expansile and hypermetabolic measuring 2 cm SUV max equal 13.4 on image 91. Incidental CT findings: none EXTREMITIES: Hypermetabolic muscular metastasis in the medial distal LEFT thigh with SUV max equal 7.8. Incidental CT findings: none IMPRESSION: 1. 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 2. Bulky soft tissue mass in the high RIGHT neck between the scapula  and vertebral bodies is intensely hypermetabolic consistent with plasmacytoma. Lesion new from most recent PET-CT scan but recurrent from PET-CT scan September 2022. 3. New hypermetabolic nodules in the RIGHT upper lobe and LEFT lower lobe. 4. Multiple muscular and subcutaneous lesions throughout the chest abdomen and pelvis. 5. Expansile skeletal lesion anterior LEFT rib. 6. Single hypermetabolic lesion in the liver consistent with hepatic metastasis. Electronically Signed   By: Suzy Bouchard M.D.   On: 06/05/2022 16:25    ASSESSMENT & PLAN:   87 y.o. male with  1.  Relapsed multiple myeloma multiple bone metastases   labs upon initial presentation from 04/14/18; hypercalcemic with Calcium at 13.8, renal function up form baseline with Creatinine at 1.48, HGB at 11.3. PSA was normal.   03/31/18 CT C/A/P revealed 1. Lytic lesion throughout all visualized vertebra from the lower cervical spine through the sacrum. Epidural extension of tumor T2, T3 and L2 level. Right L3-4 foraminal extension tumor. 2. Lytic lesions involving majority of ribs. Multiple pathologic rib fractures with bony expansion/sub pleural extension of tumor at several levels. 3. Lytic  lesions of the scapula, hips bilaterally and pelvis bilaterally. With further progression of tumor, patient may be at risk for pathologic fractures. Pathologic fracture right clavicle incompletely assessed. 4. Circumferential narrowing sigmoid colon. Question possibility of underlying mass (series 2, image 97). Alternatively this could represent muscular hypertrophy/peristalsis. 5. Gastric antrum circumferential narrowing. Cannot exclude mass although this may be related to peristalsis. 6. No primary lung malignancy noted. 7. Matted aortic pulmonary window lymph nodes. 8. Circumferential urinary bladder wall thickening greater anterior dome region. Right lateral bladder diverticulum. 9. 5 mm low-density lesion pancreatic body (series 5, image 10) unchanged.  This is felt unlikely to be a primary malignancy. 10. Right parapelvic 2.2 cm cyst without significant change. Scattered low-density renal lesions bilaterally too small to adequately characterize. Some were present previously. Statistically these are likely cysts. 11. Stable adrenal gland hyperplasia. 12. Gynecomastia. 13. Prostate gland causes slight impression upon the bladder base. 14.  Aortic Atherosclerosis. 15. Sequential pacemaker in place. 16. Gallstones.   04/23/18 PET/CT revealed Innumerable hypermetabolic lytic lesions throughout the skeleton especially involving the spine, ribs, bony pelvis along with some involvement of both proximal femurs. The appearance is compatible with pathologic diagnosis of plasma cell neoplasm could well reflect multiple myeloma. Resulting pathologic fractures of the right lateral clavicle and of multiple bilateral ribs. Lesions are present along the cortical margins of the spinal canal. 2. Both the area and the stomach in the area in the sigmoid colon drawn attention to on prior CT scan appear benign normal today. 3. The confluence of AP window lymph nodes measures about 1.2 cm in short axis with maximum SUV 3.1, which is mildly above the blood pool merit surveillance. 4. Other imaging findings of potential clinical significance: Aortic Atherosclerosis. Coronary atherosclerosis. Pacemaker noted. Borderline prostatomegaly. Cholelithiasis.  04/21/18 BM Bx revealed Normocellular bone marrow with Plasma Cell neoplasm. Scattered medium sized clusters of kappa-restricted plasma cells (14% aspirate, 10% CD138 immunohistochemistry.   I do suspect that the burden of disease is underestimated in the bone marrow sample given PET/CT findings of innumerable lytic lesions and an M spike of 3.4g   06/08/18 DG Hips bilat which revealed Innumerable lucencies are noted throughout the pelvis and proximal femurs, similar findings noted on prior PET-CT of 04/23/2018. Findings are consistent  patient's known multiple myeloma. Femoral necks are intact. 2.  Aortoiliac atherosclerotic vascular disease.  08/06/2021 PET/CT revealed "1. Interval development of consolidative masslike opacities in both upper lobes demonstrating marked hypermetabolism. While these may be infectious/inflammatory, FDG accumulation is higher than typically seen in that setting. Metastatic disease not excluded. 2. Dominant left rib, right scapula, and left sacral lesions all show marked interval decrease in size and hypermetabolism. 3. Soft tissue nodule in the midline back adjacent to the L4 spinous process is similar in size and hypermetabolism today. 4. Interval decrease in size and hypermetabolism in the gastrohepatic lymph node identified previously. 5. Multiple scattered osseous lesions compatible with treated multiple myeloma. 6. Cholelithiasis."   2.  Pulmonary infiltrates  --Likely related to radiation related change.  PLAN:    -Discussed lab results from today, 06/12/2022, with the patient. CBC showed hemoglobin of 11.0 and hematocrit of 32.6. CMP showed elevated creatinine at 1.25. -Multiple myeloma panel on 05/15/2022 showed M-protein of 0.2. PET/CT -Discussed with the patient that his lower back pain and right leg weakness/pain is probably due to pinched nerve.  -We will get back MRI to see if the patient has pinched nerve.  -Will refer to outpatient physical therapy.  -  Discussed with the patient that his lumps are skin lesions.  -Continue monthly Daratumumab as scheduled without any dose modification.  FOLLOW-UP: Referral to outpatient rehab for LBP + sciatica Monthly Dara per integrated scheduling MD visit in 4 weeks with next dara treatment  The total time spent in the appointment was *** minutes* .  All of the patient's questions were answered with apparent satisfaction. The patient knows to call the clinic with any problems, questions or concerns.   Sullivan Lone MD MS AAHIVMS  [facility] Hematology/Oncology Physician [facility]  .*Total Encounter Time as defined by the Centers for Medicare and Medicaid Services includes, in addition to the face-to-face time of a patient visit (documented in the note above) non-face-to-face time: obtaining and reviewing outside history, ordering and reviewing medications, tests or procedures, care coordination (communications with other health care professionals or caregivers) and documentation in the medical record.   Zettie Cooley, am acting as a Education administrator for Sullivan Lone, MD.

## 2022-06-19 ENCOUNTER — Encounter: Payer: Self-pay | Admitting: Hematology

## 2022-06-20 NOTE — Telephone Encounter (Signed)
Daughter called to FU on these refill requests, stating she does not know who Dr. Linna Darner is or even how to get in contact with him.  Pt is asking if MD could please refill these prescriptions.

## 2022-06-21 NOTE — Telephone Encounter (Signed)
Ok to refill meds

## 2022-06-21 NOTE — Telephone Encounter (Signed)
Pt daughter is calling and he also need refill on namenda 5 mg

## 2022-06-24 ENCOUNTER — Encounter: Payer: Self-pay | Admitting: Family Medicine

## 2022-06-24 ENCOUNTER — Ambulatory Visit (INDEPENDENT_AMBULATORY_CARE_PROVIDER_SITE_OTHER): Payer: Medicare Other | Admitting: Family Medicine

## 2022-06-24 VITALS — BP 126/62 | HR 66 | Temp 97.9°F | Ht 70.0 in | Wt 209.0 lb

## 2022-06-24 DIAGNOSIS — N401 Enlarged prostate with lower urinary tract symptoms: Secondary | ICD-10-CM

## 2022-06-24 DIAGNOSIS — C9000 Multiple myeloma not having achieved remission: Secondary | ICD-10-CM

## 2022-06-24 DIAGNOSIS — I10 Essential (primary) hypertension: Secondary | ICD-10-CM

## 2022-06-24 DIAGNOSIS — K5900 Constipation, unspecified: Secondary | ICD-10-CM

## 2022-06-24 DIAGNOSIS — R338 Other retention of urine: Secondary | ICD-10-CM | POA: Diagnosis not present

## 2022-06-24 MED ORDER — TAMSULOSIN HCL 0.4 MG PO CAPS: 0.8000 mg | ORAL_CAPSULE | Freq: Every day | ORAL | 3 refills | Status: DC

## 2022-06-24 MED ORDER — IRBESARTAN 300 MG PO TABS: 300.0000 mg | ORAL_TABLET | Freq: Every day | ORAL | 3 refills | Status: DC

## 2022-06-24 MED ORDER — POLYETHYLENE GLYCOL 3350 17 G PO PACK: 17.0000 g | PACK | Freq: Every day | ORAL | 3 refills | Status: DC

## 2022-06-24 NOTE — Progress Notes (Signed)
   Established Patient Office Visit   Subjective  Patient ID: Jonathon [name], male    DOB: October 26, 1935  Age: 87 y.o. MRN: [id number]  Chief Complaint  Patient presents with   Medical Management of Chronic Issues  Pt accompanied by his daughter [name]  Patient is an 87 year old male with pmh sig for HTN, chronic diastolic CHF CAD, paroxysmal A-fib, hepatic steatosis, dementia, chronic inflammatory demyelinating polyneuropathy, multiple myeloma, former smoker, BPH, CKD 3, history of symptomatic bradycardia with pacer in place hypercalcemia, B12 deficiency who was seen for follow-up.  Patient was residing at [facility] in [facility] but will be moving into an apartment.  Patient's daughter requesting medication refills and updated med list.  Several refill sent in by patient's cardiologist and oncologist.  Checking to see if diltiazem was sent to pharmacy.  Several medications listed on med list from SNF patient does not recall taking.      ROS Negative unless stated above    Objective:     BP 126/62 (BP Location: Right Arm, Patient Position: Sitting, Cuff Size: Large)   Pulse 66   Temp 97.9 F (36.6 C) (Oral)   Ht '5\' 10"'$  (1.778 m)   Wt 209 lb (94.8 kg)   SpO2 97%   BMI 29.99 kg/m    Physical Exam Constitutional:      General: He is not in acute distress.    Appearance: Normal appearance.  HENT:     Head: Normocephalic and atraumatic.     Nose: Nose normal.     Mouth/Throat:     Mouth: Mucous membranes are moist.  Eyes:     Extraocular Movements: Extraocular movements intact.     Conjunctiva/sclera: Conjunctivae normal.     Pupils: Pupils are equal, round, and reactive to light.  Cardiovascular:     Heart sounds: Normal heart sounds. No murmur heard.    No gallop.  Pulmonary:     Effort: Pulmonary effort is normal. No respiratory distress.     Breath sounds: No wheezing, rhonchi or rales.  Skin:    General: Skin is warm and dry.  Neurological:     Mental Status: He  is alert and oriented to person, place, and time.     Comments: Gait not assessed as sitting in transport wheelchair.      No results found for any visits on 06/24/22.    Assessment & Plan:  Essential hypertension -     Irbesartan; Take 1 tablet (300 mg total) by mouth daily. Take 300 mg by mouth daily.  Dispense: 90 tablet; Refill: 3  Multiple myeloma, remission status unspecified (HCC)  Benign prostatic hyperplasia with urinary retention -     Tamsulosin HCl; Take 2 capsules (0.8 mg total) by mouth daily.  Dispense: 90 capsule; Refill: 3  Constipation, unspecified constipation type -     Polyethylene Glycol 3350; Take 17 g by mouth daily.  Dispense: 100 each; Refill: 3  Med reconciliation performed.  Patient no longer taking acyclovir-started 2019 for prophylaxis against shingles when patient was diagnosed with multiple myeloma and starting treatment.  Namenda 5 mg discontinued summer 2023.  Memory stable.  Flomax, irbesartan 300 mg, and MiraLAX refill sent to pharmacy.  Per chart review diltiazem ER refill sent in by cardiology and generic picked up on 06/20/2022.    Follow-up as needed  [name], MD

## 2022-06-25 NOTE — Telephone Encounter (Signed)
Rx's were sent in & pt had appt with pcp yesterday.

## 2022-06-27 ENCOUNTER — Encounter: Payer: Self-pay | Admitting: Rehabilitation

## 2022-06-27 ENCOUNTER — Other Ambulatory Visit: Payer: Self-pay | Admitting: Hematology

## 2022-06-27 ENCOUNTER — Ambulatory Visit: Payer: Medicare Other | Attending: Hematology | Admitting: Rehabilitation

## 2022-06-27 ENCOUNTER — Telehealth: Payer: Self-pay | Admitting: Hematology

## 2022-06-27 DIAGNOSIS — C9000 Multiple myeloma not having achieved remission: Secondary | ICD-10-CM

## 2022-06-27 DIAGNOSIS — C7951 Secondary malignant neoplasm of bone: Secondary | ICD-10-CM | POA: Insufficient documentation

## 2022-06-27 DIAGNOSIS — M79604 Pain in right leg: Secondary | ICD-10-CM | POA: Diagnosis not present

## 2022-06-27 DIAGNOSIS — M6281 Muscle weakness (generalized): Secondary | ICD-10-CM | POA: Insufficient documentation

## 2022-06-27 DIAGNOSIS — Z7189 Other specified counseling: Secondary | ICD-10-CM

## 2022-06-27 DIAGNOSIS — C9002 Multiple myeloma in relapse: Secondary | ICD-10-CM | POA: Diagnosis not present

## 2022-06-27 DIAGNOSIS — R2689 Other abnormalities of gait and mobility: Secondary | ICD-10-CM | POA: Diagnosis not present

## 2022-06-27 DIAGNOSIS — C9001 Multiple myeloma in remission: Secondary | ICD-10-CM

## 2022-06-27 MED ORDER — POMALIDOMIDE 2 MG PO CAPS: 2.0000 mg | ORAL_CAPSULE | Freq: Every day | ORAL | 2 refills | Status: DC

## 2022-06-27 MED ORDER — DEXAMETHASONE 4 MG PO TABS: ORAL_TABLET | ORAL | 0 refills | Status: DC

## 2022-06-27 MED ORDER — ONDANSETRON HCL 8 MG PO TABS: 8.0000 mg | ORAL_TABLET | Freq: Three times a day (TID) | ORAL | 1 refills | Status: DC | PRN

## 2022-06-27 MED ORDER — ACYCLOVIR 400 MG PO TABS: 400.0000 mg | ORAL_TABLET | Freq: Two times a day (BID) | ORAL | 11 refills | Status: DC

## 2022-06-27 MED ORDER — PROCHLORPERAZINE MALEATE 10 MG PO TABS: 10.0000 mg | ORAL_TABLET | Freq: Four times a day (QID) | ORAL | 1 refills | Status: DC | PRN

## 2022-06-27 NOTE — Telephone Encounter (Signed)
Called patient and spoke with patient's daughter. Confirmed next treatment date. Patient will be notified.

## 2022-06-27 NOTE — Therapy (Signed)
OUTPATIENT PHYSICAL THERAPY ONCOLOGY EVALUATION  Patient Name: Jonathon [name] MRN: [id number] DOB:1935/12/05, 87 y.o., male Today's Date: 06/27/2022  END OF SESSION:  PT End of Session - 06/27/22 1605     Visit Number 1    Number of Visits 7    Date for PT Re-Evaluation 07/25/22    Authorization Type no auth needed    PT Start Time 1200    PT Stop Time 1250    PT Time Calculation (min) 50 min    Activity Tolerance Patient tolerated treatment well    Behavior During Therapy WFL for tasks assessed/performed             Past Medical History:  Diagnosis Date   Asthma    BENIGN PROSTATIC HYPERTROPHY, HX OF 10/25/2008   Bone metastases 04/14/2018   BRADYCARDIA 2005   Chronic diastolic congestive heart failure ([place]) 06/05/2016   CIDP (chronic inflammatory demyelinating polyneuropathy) ([place])    HYPERTENSION 11/21/2006   Iron deficiency anemia, unspecified 04/19/2013   Multiple myeloma ([place]) 04/29/2018   NEPHROLITHIASIS, HX OF 11/21/2006 and 10/15/2008   PACEMAKER, PERMANENT 2005   Gen change 2014 Medtronic Adaptic L dual-chamber pacemaker, serial [id number]    SVT (supraventricular tachycardia)    TOBACCO ABUSE 10/25/2008   Quit 2012   Past Surgical History:  Procedure Laterality Date   COLONOSCOPY  2004,2009   PACEMAKER INSERTION  05/14/2003   PERMANENT PACEMAKER GENERATOR CHANGE N/A 06/19/2012   Procedure: PERMANENT PACEMAKER GENERATOR CHANGE;  Surgeon: [name], MD; Medtronic Adaptic L dual-chamber pacemaker, serial [id number]     SKIN GRAFT Right 05/13/1960   wrist   TOOTH EXTRACTION  11/10/2020   3 teeth removed   Patient Active Problem List   Diagnosis Date Noted   Paroxysmal atrial fibrillation ([place]) 10/11/2021   Secondary hypercoagulable state ([place]) 10/11/2021   Counseling regarding advance care planning and goals of care 04/29/2018   Multiple myeloma ([place]) 04/29/2018   Malignant neoplasm metastatic to bone ([place]) 04/14/2018   Hypercalcemia 04/14/2018   Lytic  bone lesions on xray 03/29/2018   Displaced fracture of lateral end of right clavicle with routine healing 03/29/2018   Fall 09/05/2017   Sepsis secondary to UTI ([place]) 09/05/2017   Urinary retention 09/05/2017   AKI (acute kidney injury) ([place]) 09/05/2017   CAD (coronary artery disease) 09/05/2017   Superficial vein thrombosis 08/19/2017   Altered mental status    Metabolic encephalopathy 123XX123   Dementia ([place]) 08/04/2017   Seizure ([place]) 08/01/2017   Thiamine deficiency 05/27/2017   B12 deficiency 04/30/2017   Alcoholism ([place]) 01/28/2017   Acute encephalopathy 12/28/2016   Hepatic steatosis 06/05/2016   Physical deconditioning 06/05/2016   Chronic diastolic congestive heart failure ([place]) 06/05/2016   Orthostatic syncope    Elevated liver function tests    LOC (loss of consciousness) ([place]) 08/10/2014   CIDP (chronic inflammatory demyelinating polyneuropathy) ([place]) 08/08/2014   Sustained SVT ([place]) 07/27/2014   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 [name]  REFERRING PROVIDER: Dr. Irene Limbo  REFERRING DIAG: C90.01 (ICD-10-CM) - Multiple myeloma relapsed with  bone mets  THERAPY DIAG:  Multiple myeloma in relapse (HCC)  Other abnormalities of gait and mobility  Muscle weakness (generalized)  Pain in right leg  ONSET DATE: 05/29/22  Rationale for Evaluation and Treatment: Rehabilitation  SUBJECTIVE:                                                                                                                                                                                            SUBJECTIVE STATEMENT: The weakness in the Rt leg and knee started about a month ago. I was living at an assisted living facility and walking around but now I moved in with my daughter and I think I just don't move anymore.  I can walk with with SPC. I feel like the Rt leg may give out.  I get back pain if I stand long enough to brush my teeth.    PERTINENT HISTORY: recent disease progression with lung recurrence, soft tissue mass in Rt neck between scapula and vertebra, multiple muscular and subcutaneous lesions throughout chest and pelvis, Left rib, and liver, minimal remaining skeletal disease, mass in medial distal left thigh,    HTN, pacemaker, CHF, stage 3 CKD, Afib, Currently on immunotherapy.    PAIN:  Are you having pain? No It hurts mostly at night  NPRS scale: 5/10 Pain location: Rt back of the hip and down to the mid calf  Pain orientation: Right  PAIN TYPE: throbbing Pain description: throbbing  Aggravating factors: it hurts at night  Relieving factors: Getting in the right position   PRECAUTIONS: Active disease, bone mets, fall risk  WEIGHT BEARING RESTRICTIONS: No  FALLS:  Has patient fallen in last 6 months? 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    POSTURE: increased thoracic kyphosis  LOWER  EXTREMITY AROM/PROM:  A/PROM Right eval  Hip flexion Seated flexion around 50%   Hip extension   Hip abduction WNL in seated  Hip adduction WNL in seated  Hip internal rotation   Hip external rotation   Knee flexion   Knee extension 80%  Ankle dorsiflexion 80%  Ankle plantarflexion WNL  Ankle inversion   Ankle eversion    (Blank rows = not tested)  Left leg all WNL  Deferred MMT due to mets  Sit to stand WNL with use of arm rests on W/C - unable to do repeated stands Sit to supine pt providing minA for his own extremity Unable to lift leg in supine minA to get leg positioned in hooklying  GAIT: Distance walked: 54f with SPC and gait belt CGA daughter pushing WC behind Comments: limited by fear of walking much more  TODAY'S TREATMENT:                                                                                                                                         DATE:  06/27/22 Eval performed Discussed HHPT vs outpatient - pt would like to do outpatient until he moves into his new apartment alone and then would like HHPT.  Reviewed initial TE and recommended pt not walk around ind at home without assistance due to fear of falling.   PATIENT EDUCATION:  Education details: POC, initial HEP Person educated: Patient and Child(ren) Education method: EConsulting civil engineer DMedia planner and Handouts Education comprehension: verbalized understanding  HOME EXERCISE PROGRAM: Medbridge link did not save: Seated: heel raise, toe raise, LAQ, ball squeeze, march, sit to stand with safety as needed  ASSESSMENT:  CLINICAL IMPRESSION: Patient is a 87y.o. male who was seen today for physical therapy evaluation and treatment for his new onset Rt leg weakness.  Pt has disease progressing multiple myeloma with mets per history section and new mass over his L5 vertebrae.  We discussed how we can attempt to strengthen things with functional movements but how he will not be able to do much  weight training due to mets and that it will depend on tumor involvement how much improvement he will see.  Pt demonstrates right leg weakness mainly hip flexion, knee extension, DF, but was not able to do MMT due to mets.  He is mobile but has a fear of falling and feeling like his leg may give out.  We discussed how HHPT may be an option if it is hard to get out of his home and pt would like to do HHPT after he moves into his apartment in a few weeks.  Discussed with pt how POC will include functional movement, gait, exercise.  Pt was doing a gym and nustep at his assisted living facility just a month ago and would like to resume this.   Add: Pt fell in parking lot upon leaving:  He put all of his weight on his Rt leg to get his left leg into the passenger side and reports he just fell down.  Safety portal made.  No injuries reported.  He was helped into the car assist x 3.    OBJECTIVE IMPAIRMENTS: Abnormal gait, decreased balance, decreased  mobility, and difficulty walking.   ACTIVITY LIMITATIONS: locomotion level  PARTICIPATION LIMITATIONS: shopping and community activity  PERSONAL FACTORS: Age, Fitness, and 1-2 comorbidities: disease status  are also affecting patient's functional outcome.   REHAB POTENTIAL: Good  CLINICAL DECISION MAKING: Evolving/moderate complexity  EVALUATION COMPLEXITY: Moderate  GOALS: Goals reviewed with patient? Yes  SHORT TERM GOALS = LTGs: Target date: 07/25/22  Pt will be ind with HEP for mobility and prevention of worsening leg weakness Baseline: Goal status: INITIAL  2.  Pt will demonstrate safe mobility with LRAD in his home and in the community Baseline:  Goal status: INITIAL  3.  Pt will be able able to walk 133f in the clinic with LRAD safely to demonstrate improved mobility and home safety Baseline:  Goal status: INITIAL  4.  Pt will transition to HHPT Baseline:  Goal status: INITIAL  PLAN:  PT FREQUENCY: 2x/week  PT DURATION: 3  weeks  PLANNED INTERVENTIONS: Therapeutic exercises, Therapeutic activity, Neuromuscular re-education, Balance training, Gait training, Patient/Family education, Self Care, Manual therapy, and Re-evaluation  PLAN FOR NEXT SESSION: ok from fall? Nustep, gait (fall risk), seated active mobility, parallel bar activity - transition to HHPT METS: Left thigh muscle, L5, left ribs    TStark Bray PT 06/27/2022, 4:08 PM

## 2022-06-27 NOTE — Progress Notes (Signed)
Hematology follow-up  I called patient's daughter Jonathon [name] along with [name]. I went over the PET CT scan again in details and discussed the concerns for myeloma disease progression in several areas including extraosseous presentation in the lung liver as well as skin. I recommended and the patient and her daughter are agreeable with -Switching from maintenance Revlimid to Isatuximab plus pomalidomide plus dexamethasone third line treatment. -Patient was asked to increase his Revlimid to 10 mg p.o. daily for the next 10 days to complete his current course of treatment after which we will be switching to pomalidomide. -Will be referred to radiation oncology to address some of the symptomatic areas of progression including areas that could cause more symptoms. -Currently patient notes some increased low back pain with some sciatica-like symptoms on the right.  Jonathon [name]

## 2022-06-28 ENCOUNTER — Telehealth: Payer: Self-pay | Admitting: Pharmacist

## 2022-06-28 ENCOUNTER — Other Ambulatory Visit (HOSPITAL_COMMUNITY): Payer: Self-pay

## 2022-06-28 ENCOUNTER — Telehealth: Payer: Self-pay | Admitting: Pharmacy Technician

## 2022-06-28 ENCOUNTER — Encounter: Payer: Self-pay | Admitting: Hematology

## 2022-06-28 DIAGNOSIS — Z7189 Other specified counseling: Secondary | ICD-10-CM

## 2022-06-28 DIAGNOSIS — C9001 Multiple myeloma in remission: Secondary | ICD-10-CM

## 2022-06-28 NOTE — Telephone Encounter (Signed)
Oral Oncology Patient Advocate Encounter   Received notification that prior authorization for Pomalyst is required.   PA submitted on 06/28/22 Key [id number] Status is pending     Jonathon [name], CPhT-Adv Oncology Pharmacy Patient [place] Direct Number: [phone]  Fax: [phone]

## 2022-06-28 NOTE — Telephone Encounter (Signed)
Oral Oncology Pharmacist Encounter  Received new prescription for Pomalyst (pomalidomide) for the treatment of R/R multiple myeloma in conjunction with isatuximab and dexamethasone, planned duration until disease progression or unacceptable drug toxicity.  CBC w/ Diff from 06/12/22 and CMP from 05/15/22 assessed, noted Scr of 1.25 mg/dL (CrCl ~58.9 mL/min). No renal dose adjustments required. Patient starting on dose reduction per MD.  Current medication list in Epic reviewed, DDIs with Pomalyst identified: CNS depressant effects of Pomalyst may be increased when combined with Tramadol as well as Compazine. Recommend monitoring patient for increased somnolence. No change in therapy warranted at this time.    Evaluated chart and no patient barriers to medication adherence noted.   Once [employer] auth # is obtained, prescription will need to [facility]scribed to [facility] with dispensing.   Oral Oncology Clinic will continue to follow for insurance authorization, copayment issues, initial counseling and start date.  Leron Croak, PharmD, BCPS, BCOP Hematology/Oncology Clinical Pharmacist [facility] and [place] [phone] 06/28/2022 10:40 AM

## 2022-07-01 ENCOUNTER — Ambulatory Visit: Payer: Medicare Other | Admitting: Rehabilitation

## 2022-07-01 DIAGNOSIS — M79604 Pain in right leg: Secondary | ICD-10-CM

## 2022-07-01 DIAGNOSIS — R2689 Other abnormalities of gait and mobility: Secondary | ICD-10-CM | POA: Diagnosis not present

## 2022-07-01 DIAGNOSIS — C9002 Multiple myeloma in relapse: Secondary | ICD-10-CM | POA: Diagnosis not present

## 2022-07-01 DIAGNOSIS — M6281 Muscle weakness (generalized): Secondary | ICD-10-CM | POA: Diagnosis not present

## 2022-07-01 DIAGNOSIS — C7951 Secondary malignant neoplasm of bone: Secondary | ICD-10-CM | POA: Diagnosis not present

## 2022-07-01 MED FILL — Dexamethasone Sodium Phosphate Inj 100 MG/10ML: INTRAMUSCULAR | Qty: 2 | Status: AC

## 2022-07-01 NOTE — Therapy (Signed)
OUTPATIENT PHYSICAL THERAPY ONCOLOGY TREATMENT  Patient Name: Jonathon [name] MRN: [name] DOB:04/19/1936, 87 y.o., male Today's Date: 07/01/2022  END OF SESSION:  PT End of Session - 07/01/22 1303     Visit Number 2    Number of Visits 7    Date for PT Re-Evaluation 07/25/22    PT Start Time 1208    PT Stop Time 1250    PT Time Calculation (min) 42 min    Activity Tolerance Patient tolerated treatment well    Behavior During Therapy WFL for tasks assessed/performed              Past Medical History:  Diagnosis Date   Asthma    BENIGN PROSTATIC HYPERTROPHY, HX OF 10/25/2008   Bone metastases 04/14/2018   BRADYCARDIA 2005   Chronic diastolic congestive heart failure ([place]) 06/05/2016   CIDP (chronic inflammatory demyelinating polyneuropathy) ([place])    HYPERTENSION 11/21/2006   Iron deficiency anemia, unspecified 04/19/2013   Multiple myeloma ([place]) 04/29/2018   NEPHROLITHIASIS, HX OF 11/21/2006 and 10/15/2008   PACEMAKER, PERMANENT 2005   Gen change 2014 Medtronic Adaptic L dual-chamber pacemaker, serial [id number]    SVT (supraventricular tachycardia)    TOBACCO ABUSE 10/25/2008   Quit 2012   Past Surgical History:  Procedure Laterality Date   COLONOSCOPY  2004,2009   PACEMAKER INSERTION  05/14/2003   PERMANENT PACEMAKER GENERATOR CHANGE N/A 06/19/2012   Procedure: PERMANENT PACEMAKER GENERATOR CHANGE;  Surgeon: [name], MD; Medtronic Adaptic L dual-chamber pacemaker, serial [id number]     SKIN GRAFT Right 05/13/1960   wrist   TOOTH EXTRACTION  11/10/2020   3 teeth removed   Patient Active Problem List   Diagnosis Date Noted   Paroxysmal atrial fibrillation ([place]) 10/11/2021   Secondary hypercoagulable state ([place]) 10/11/2021   Counseling regarding advance care planning and goals of care 04/29/2018   Multiple myeloma ([place]) 04/29/2018   Malignant neoplasm metastatic to bone ([place]) 04/14/2018   Hypercalcemia 04/14/2018   Lytic bone lesions on xray 03/29/2018    Displaced fracture of lateral end of right clavicle with routine healing 03/29/2018   Fall 09/05/2017   Sepsis secondary to UTI ([place]) 09/05/2017   Urinary retention 09/05/2017   AKI (acute kidney injury) ([place]) 09/05/2017   CAD (coronary artery disease) 09/05/2017   Superficial vein thrombosis 08/19/2017   Altered mental status    Metabolic encephalopathy 123XX123   Dementia ([place]) 08/04/2017   Seizure ([place]) 08/01/2017   Thiamine deficiency 05/27/2017   B12 deficiency 04/30/2017   Alcoholism ([place]) 01/28/2017   Acute encephalopathy 12/28/2016   Hepatic steatosis 06/05/2016   Physical deconditioning 06/05/2016   Chronic diastolic congestive heart failure ([place]) 06/05/2016   Orthostatic syncope    Elevated liver function tests    LOC (loss of consciousness) ([place]) 08/10/2014   CIDP (chronic inflammatory demyelinating polyneuropathy) ([place]) 08/08/2014   Sustained SVT ([place]) 07/27/2014   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 [name]  REFERRING PROVIDER: Dr. Irene Limbo  REFERRING DIAG: C90.01 (ICD-10-CM) - Multiple myeloma relapsed with bone mets  THERAPY DIAG:  Other  abnormalities of gait and mobility  Multiple myeloma in relapse (HCC)  Muscle weakness (generalized)  Pain in right leg  ONSET DATE: 05/29/22  Rationale for Evaluation and Treatment: Rehabilitation  SUBJECTIVE:                                                                                                                                                                                            SUBJECTIVE STATEMENT:      EVAL:The weakness in the Rt leg and knee started about a month ago. I was living at an assisted living facility and walking around but now I moved in with my daughter and I think I just don't move anymore.  I can walk with with SPC. I feel like the Rt leg may give out.  I get back pain if I stand long enough to brush my teeth.    PERTINENT HISTORY: recent disease progression with lung recurrence, soft tissue mass in Rt neck between scapula and vertebra, multiple muscular and subcutaneous lesions throughout chest and pelvis, Left rib, and liver, minimal remaining skeletal disease, mass in medial distal left thigh,    HTN, pacemaker, CHF, stage 3 CKD, Afib, Currently on immunotherapy.    PAIN:  Are you having pain? No It hurts mostly at night  NPRS scale: 5/10 Pain location: Rt back of the hip and down to the mid calf  Pain orientation: Right  PAIN TYPE: throbbing Pain description: throbbing  Aggravating factors: it hurts at night  Relieving factors: Getting in the right position   PRECAUTIONS: Active disease, bone mets, fall risk  WEIGHT BEARING RESTRICTIONS: No  FALLS:  Has patient fallen in last 6 months? 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    POSTURE: increased thoracic kyphosis  LOWER EXTREMITY  AROM/PROM:  A/PROM Right eval  Hip flexion Seated flexion around 50%   Hip extension   Hip abduction WNL in seated  Hip adduction WNL in seated  Hip internal rotation   Hip external rotation   Knee flexion   Knee extension 80%  Ankle  dorsiflexion 80%  Ankle plantarflexion WNL  Ankle inversion   Ankle eversion    (Blank rows = not tested)  Left leg all WNL  Deferred MMT due to mets  Sit to stand WNL with use of arm rests on W/C - unable to do repeated stands Sit to supine pt providing minA for his own extremity Unable to lift leg in supine minA to get leg positioned in hooklying  GAIT: Distance walked: 36f with SPC and gait belt CGA daughter pushing WC behind Comments: limited by fear of walking much more  TODAY'S TREATMENT:                                                                                                                                         DATE:  07/01/22:  Transferred WC to mat CGA with WC Sit to supine Ind Supine heel slides assisted Rt x 4 and x Lt x 4 ind SAQ assisted x 5 Rt and x 5 left Hooklying ball squeeze x 5 Seated: Heel pumps x 10 bil LAQ assisted Rt x 5 and Lt x 5 Walked x 191ft from table to chair at parallel bars with gait belt and SBA In parallel bars: Heel raises x 5, pregait steps bil x 5, mini squats x 5 Then chair rest x 1m54m Then side steps x 3 bil x 2 Standing without hold CGA x 15" x 2  Helped pt out to the car safely Discussed fall risk checklist handout for safe set up at his new apartment and using his RW more which he is agreeable with .   06/27/22 Eval performed Discussed HHPT vs outpatient - pt would like to do outpatient until he moves into his new apartment alone and then would like HHPT.  Reviewed initial TE and recommended pt not walk around ind at home without assistance due to fear of falling.   PATIENT EDUCATION:  Education details: POC, initial HEP Person educated: Patient and Child(ren) Education  method: ExpConsulting civil engineeremonstration, and Handouts Education comprehension: verbalized understanding  HOME EXERCISE PROGRAM: Medbridge link did not save: Seated: heel raise, toe raise, LAQ, ball squeeze, march, sit to stand with safety as needed  ASSESSMENT:  CLINICAL IMPRESSION: Pt did well with his first session of exercise today.  Needs minA for Rt LE activity and had increased back pain with supine positioning although he reports this is his usual in a supine position.     OBJECTIVE IMPAIRMENTS: Abnormal gait, decreased balance, decreased mobility, and difficulty walking.   ACTIVITY LIMITATIONS: locomotion level  PARTICIPATION LIMITATIONS: shopping and community activity  PERSONAL FACTORS: Age, Fitness, and 1-2 comorbidities: disease status  are also affecting patient's functional outcome.   REHAB POTENTIAL: Good  CLINICAL DECISION MAKING: Evolving/moderate complexity  EVALUATION COMPLEXITY: Moderate  GOALS: Goals reviewed with patient? Yes  SHORT TERM GOALS = LTGs: Target date: 07/25/22  Pt will be ind with  HEP for mobility and prevention of worsening leg weakness Baseline: Goal status: INITIAL  2.  Pt will demonstrate safe mobility with LRAD in his home and in the community Baseline:  Goal status: INITIAL  3.  Pt will be able able to walk 171f in the clinic with LRAD safely to demonstrate improved mobility and home safety Baseline:  Goal status: INITIAL  4.  Pt will transition to HHPT Baseline:  Goal status: INITIAL  PLAN:  PT FREQUENCY: 2x/week  PT DURATION: 3 weeks  PLANNED INTERVENTIONS: Therapeutic exercises, Therapeutic activity, Neuromuscular re-education, Balance training, Gait training, Patient/Family education, Self Care, Manual therapy, and Re-evaluation  PLAN FOR NEXT SESSION:  Nustep legs only, gait (fall risk), seated active mobility, parallel bar activity - transition to HHPT METS: Left thigh muscle, Rt pectoralis, L5, left ribs    TStark Bray PT 07/01/2022, 1:04 PM

## 2022-07-01 NOTE — Telephone Encounter (Signed)
Oral Oncology Patient Advocate Encounter  Received notification that the request for prior authorization for Pomalyst has been denied due to .    An urgent appeal for the prior authorization denial of Pomalyst will be started by the pharmacist.   This encounter will continue to be updated until final appeal determination.     Lady Deutscher, CPhT-Adv Oncology Pharmacy Patient [place] Direct Number: [phone]  Fax: [phone]

## 2022-07-01 NOTE — Telephone Encounter (Signed)
Oral Oncology Pharmacist Encounter   Prior Authorization for Pomalyst has been denied.     Appeal letter sent to [facility] with supporting documentation. Appeal faxed to: [phone]    Oral Oncology Clinic will continue to follow.    Leron Croak, PharmD, BCPS, [facility] Hematology/Oncology Clinical Pharmacist [facility] and [place] [phone] 07/01/2022 10:27 AM

## 2022-07-02 ENCOUNTER — Other Ambulatory Visit (HOSPITAL_COMMUNITY): Payer: Self-pay

## 2022-07-02 ENCOUNTER — Inpatient Hospital Stay: Payer: Medicare Other

## 2022-07-02 ENCOUNTER — Other Ambulatory Visit: Payer: Self-pay

## 2022-07-02 ENCOUNTER — Inpatient Hospital Stay (HOSPITAL_BASED_OUTPATIENT_CLINIC_OR_DEPARTMENT_OTHER): Payer: Medicare Other | Admitting: Physician Assistant

## 2022-07-02 ENCOUNTER — Inpatient Hospital Stay: Payer: Medicare Other | Attending: Hematology

## 2022-07-02 VITALS — BP 125/56 | HR 60 | Temp 98.4°F | Resp 17

## 2022-07-02 VITALS — BP 122/78 | HR 81 | Temp 97.8°F | Resp 19 | Ht 70.0 in | Wt 205.5 lb

## 2022-07-02 DIAGNOSIS — C9001 Multiple myeloma in remission: Secondary | ICD-10-CM

## 2022-07-02 DIAGNOSIS — R531 Weakness: Secondary | ICD-10-CM | POA: Insufficient documentation

## 2022-07-02 DIAGNOSIS — Z7189 Other specified counseling: Secondary | ICD-10-CM | POA: Diagnosis not present

## 2022-07-02 DIAGNOSIS — C9002 Multiple myeloma in relapse: Secondary | ICD-10-CM | POA: Diagnosis present

## 2022-07-02 DIAGNOSIS — Z5112 Encounter for antineoplastic immunotherapy: Secondary | ICD-10-CM | POA: Insufficient documentation

## 2022-07-02 DIAGNOSIS — R29898 Other symptoms and signs involving the musculoskeletal system: Secondary | ICD-10-CM | POA: Diagnosis not present

## 2022-07-02 DIAGNOSIS — R918 Other nonspecific abnormal finding of lung field: Secondary | ICD-10-CM | POA: Insufficient documentation

## 2022-07-02 DIAGNOSIS — C9000 Multiple myeloma not having achieved remission: Secondary | ICD-10-CM

## 2022-07-02 LAB — CBC WITH DIFFERENTIAL (CANCER CENTER ONLY)
Abs Immature Granulocytes: 0.01 K/uL (ref 0.00–0.07)
Basophils Absolute: 0 K/uL (ref 0.0–0.1)
Basophils Relative: 1 %
Eosinophils Absolute: 0.3 K/uL (ref 0.0–0.5)
Eosinophils Relative: 9 %
HCT: 28.1 % — ABNORMAL LOW (ref 39.0–52.0)
Hemoglobin: 9.3 g/dL — ABNORMAL LOW (ref 13.0–17.0)
Immature Granulocytes: 0 %
Lymphocytes Relative: 42 %
Lymphs Abs: 1.2 K/uL (ref 0.7–4.0)
MCH: 28.4 pg (ref 26.0–34.0)
MCHC: 33.1 g/dL (ref 30.0–36.0)
MCV: 85.7 fL (ref 80.0–100.0)
Monocytes Absolute: 0.3 K/uL (ref 0.1–1.0)
Monocytes Relative: 12 %
Neutro Abs: 1 K/uL — ABNORMAL LOW (ref 1.7–7.7)
Neutrophils Relative %: 36 %
Platelet Count: 166 K/uL (ref 150–400)
RBC: 3.28 MIL/uL — ABNORMAL LOW (ref 4.22–5.81)
RDW: 15.7 % — ABNORMAL HIGH (ref 11.5–15.5)
WBC Count: 2.9 K/uL — ABNORMAL LOW (ref 4.0–10.5)
nRBC: 0 % (ref 0.0–0.2)

## 2022-07-02 MED ORDER — DIPHENHYDRAMINE HCL 50 MG/ML IJ SOLN
50.0000 mg | Freq: Once | INTRAMUSCULAR | Status: AC
  Administered 2022-07-02: 50 mg via INTRAVENOUS
  Filled 2022-07-02: qty 1

## 2022-07-02 MED ORDER — SODIUM CHLORIDE 0.9 % IV SOLN
20.0000 mg | Freq: Once | INTRAVENOUS | Status: AC
  Administered 2022-07-02: 20 mg via INTRAVENOUS
  Filled 2022-07-02: qty 20

## 2022-07-02 MED ORDER — FAMOTIDINE IN NACL 20-0.9 MG/50ML-% IV SOLN
20.0000 mg | Freq: Once | INTRAVENOUS | Status: AC
  Administered 2022-07-02: 20 mg via INTRAVENOUS
  Filled 2022-07-02: qty 50

## 2022-07-02 MED ORDER — ACETAMINOPHEN 325 MG PO TABS
650.0000 mg | ORAL_TABLET | Freq: Once | ORAL | Status: AC
  Administered 2022-07-02: 650 mg via ORAL
  Filled 2022-07-02: qty 2

## 2022-07-02 MED ORDER — SODIUM CHLORIDE 0.9 % IV SOLN
10.0000 mg/kg | Freq: Once | INTRAVENOUS | Status: AC
  Administered 2022-07-02: 900 mg via INTRAVENOUS
  Filled 2022-07-02: qty 25

## 2022-07-02 MED ORDER — SODIUM CHLORIDE 0.9 % IV SOLN: Freq: Once | INTRAVENOUS | Status: AC

## 2022-07-02 MED ORDER — MONTELUKAST SODIUM 10 MG PO TABS
10.0000 mg | ORAL_TABLET | Freq: Once | ORAL | Status: AC
  Administered 2022-07-02: 10 mg via ORAL
  Filled 2022-07-02: qty 1

## 2022-07-02 NOTE — Patient Instructions (Signed)
[facility]  Discharge Instructions: Thank you for choosing [facility] to provide your oncology and hematology care.   If you have a lab appointment with the [facility], please go directly to the [facility] and check in at the registration area.   Wear comfortable clothing and clothing appropriate for easy access to any Portacath or PICC line.   We strive to give you quality time with your provider. You may need to reschedule your appointment if you arrive late (15 or more minutes).  Arriving late affects you and other patients whose appointments are after yours.  Also, if you miss three or more appointments without notifying the office, you may be dismissed from the clinic at the provider's discretion.      For prescription refill requests, have your pharmacy contact our office and allow 72 hours for refills to be completed.    Today you received the following chemotherapy and/or immunotherapy agents: isatuximab (sarclisa)      To help prevent nausea and vomiting after your treatment, we encourage you to take your nausea medication as directed.  BELOW ARE SYMPTOMS THAT SHOULD BE REPORTED IMMEDIATELY: *FEVER GREATER THAN 100.4 F (38 C) OR HIGHER *CHILLS OR SWEATING *NAUSEA AND VOMITING THAT IS NOT CONTROLLED WITH YOUR NAUSEA MEDICATION *UNUSUAL SHORTNESS OF BREATH *UNUSUAL BRUISING OR BLEEDING *URINARY PROBLEMS (pain or burning when urinating, or frequent urination) *BOWEL PROBLEMS (unusual diarrhea, constipation, pain near the anus) TENDERNESS IN MOUTH AND THROAT WITH OR WITHOUT PRESENCE OF ULCERS (sore throat, sores in mouth, or a toothache) UNUSUAL RASH, SWELLING OR PAIN  UNUSUAL VAGINAL DISCHARGE OR ITCHING   Items with * indicate a potential emergency and should be followed up as soon as possible or go to the Emergency Department if any problems should occur.  Please show the CHEMOTHERAPY ALERT CARD or IMMUNOTHERAPY ALERT  CARD at check-in to the Emergency Department and triage nurse.  Should you have questions after your visit or need to cancel or reschedule your appointment, please contact [facility]  Dept: [phone]  and follow the prompts.  Office hours are 8:00 a.m. to 4:30 p.m. Monday - Friday. Please note that voicemails left after 4:00 p.m. may not be returned until the following business day.  We are closed weekends and major holidays. You have access to a nurse at all times for urgent questions. Please call the main number to the clinic Dept: [phone] and follow the prompts.   For any non-urgent questions, you may also contact your provider using MyChart. We now offer e-Visits for anyone 17 and older to request care online for non-urgent symptoms. For details visit [email or web address].   Also download the MyChart app! Go to the app store, search "MyChart", open the app, select [facility], and log in with your MyChart username and password. Isatuximab Injection What is this medication? ISATUXIMAB (EYE sa TUX i mab) treats a type of bone marrow cancer (multiple myeloma). It works by blocking a protein that causes cancer cells to grow and multiply. This helps slow or stop the spread of cancer cells. This medicine may be used for other purposes; ask your health care provider or pharmacist if you have questions. COMMON BRAND NAME(S): SARCLISA What should I tell my care team before I take this medication? They need to know if you have any of these conditions: Heart disease Infection especially a viral infection, such as chickenpox, cold sores, herpes An unusual  or allergic reaction to isatuximab, other medications, foods, dyes, or preservatives Pregnant or trying to get pregnant Breast-feeding How should I use this medication? This medication is injected into a vein. It is given by your care team in a hospital or clinic setting. Talk to your care team about  the use of this medication in children. Special care may be needed. Overdosage: If you think you have taken too much of this medicine contact a poison control center or emergency room at once. NOTE: This medicine is only for you. Do not share this medicine with others. What if I miss a dose? Keep appointments for follow-up doses. It is important not to miss your dose. Call your care team if you are unable to keep an appointment. What may interact with this medication? Interactions have not been studied. This list may not describe all possible interactions. Give your health care provider a list of all the medicines, herbs, non-prescription drugs, or dietary supplements you use. Also tell them if you smoke, drink alcohol, or use illegal drugs. Some items may interact with your medicine. What should I watch for while using this medication? Your condition will be monitored carefully while you are receiving this medication. You may need blood work done while you are taking this medication. This medication may increase your risk of getting an infection. Call your care team for advice if you get a fever, chills, sore throat, or other symptoms of a cold or flu. Do not treat yourself. Try to avoid being around people who are sick. If you have not had the measles or chickenpox vaccines, tell your care team right away if you are around someone with these viruses. Talk to your care team about your risk of cancer. You may be more at risk for certain types of cancers if you take this medication. This medication can affect the results of blood tests to match your blood type. Your care team will do blood tests to match your blood type before you start treatment. Tell your care team that you are being treated with this medication before receiving a blood transfusion. Do not become pregnant while taking this medicine or for at least 5 months after stopping it. Inform your doctor if you wish to become pregnant or think  you might be pregnant. There is a potential for serious side effects to an unborn child. Talk to your care team for more information. Do not breast-feed while taking this medicine. What side effects may I notice from receiving this medication? Side effects that you should report to your care team as soon as possible: Allergic reactions--skin rash, itching, hives, swelling of the face, lips, tongue, or throat Heart failure--shortness of breath, swelling of the angles, feet, or hands, sudden weight gain, unusual weakness or fatigue Infection--fever, chills, cough, or sore throat Infusion reactions--chest pain, shortness of breath or trouble breathing, feeling faint or lightheaded Side effects that usually do not require medical attention (report these to your care team if they continue or are bothersome): Back pain Diarrhea Increase in blood pressure Trouble sleeping Vomiting This list may not describe all possible side effects. Call your doctor for medical advice about side effects. You may report side effects to FDA at 1-800-FDA-1088. Where should I keep my medication? This medication is given in a hospital or clinic. It will not be stored at home. NOTE: This sheet is a summary. It may not cover all possible information. If you have questions about this medicine, talk to your doctor,  pharmacist, or health care provider.  2023 Elsevier/Gold Standard (2021-02-12 00:00:00)

## 2022-07-02 NOTE — Progress Notes (Addendum)
Patient seen by PA today  Vitals are within treatment parameters.  Labs reviewed: and are within treatment parameters.  Per physician team, patient is ready for treatment and there are NO modifications to the treatment plan.      ANC:   PA aware 1000 and pt is OK to treat

## 2022-07-02 NOTE — Progress Notes (Signed)
HEMATOLOGY/ONCOLOGY CLINIC NOTE  Date of Service: 07/02/22   Patient Care Team: [name], MD as PCP - General (Family Medicine) [name], MD (Cardiology) [name], MD (Cardiology) [name], DO as Consulting Physician (Neurology)  CHIEF COMPLAINTS/PURPOSE OF CONSULTATION:  Follow-up for continued evaluation and management of multiple myeloma  HISTORY OF PRESENTING ILLNESS:  Please see previous note for details on initial presentation.  INTERVAL HISTORY:   Jonathon [name] is a 87 y.o. male here for continued evaluation and management of multiple myeloma.  Patient was last seen by Dr.  Limbo on 06/12/2022 and presents today to start Cycle 1, Day 1 of Isatuximab/Pomalyst/Dexamethasone.  He is unaccompanied for this visit.   He presents today complaining of worsening right leg weakness that has been present for approximately 4 weeks. He reports the right leg weakness does interfere with his ambulation and he needs his walker to provide stability. He had one fall last week due to the right leg weakness. He reports pain behind his right thigh that radiates to the knee. He denies any saddle paresthesias or bowel/urine incontinence. He has noticed a new lump on his forehead that is getting bigger. The lump is nontender and similar to the ones he has noticed his low back and right shoulder area. He denies any new fatigue or energy changes. His appetite and weight are stable. He denies nausea, vomiting or abdominal pain. His bowel habits are unchanged without recurrent episodes of diarrhea or constipation. He denies fevers, chills, sweats, shortness of breath, chest pain or cough. He has no other complaints.    MEDICAL HISTORY:  Past Medical History:  Diagnosis Date   Asthma    BENIGN PROSTATIC HYPERTROPHY, HX OF 10/25/2008   Bone metastases 04/14/2018   BRADYCARDIA 2005   Chronic diastolic congestive heart failure ([facility]) 06/05/2016   CIDP (chronic inflammatory  demyelinating polyneuropathy) ([facility])    HYPERTENSION 11/21/2006   Iron deficiency anemia, unspecified 04/19/2013   Multiple myeloma ([facility]) 04/29/2018   NEPHROLITHIASIS, HX OF 11/21/2006 and 10/15/2008   PACEMAKER, PERMANENT 2005   Gen change 2014 Medtronic Adaptic L dual-chamber pacemaker, serial [id number]    SVT (supraventricular tachycardia)    TOBACCO ABUSE 10/25/2008   Quit 2012    SURGICAL HISTORY: Past Surgical History:  Procedure Laterality Date   COLONOSCOPY  2004,2009   PACEMAKER INSERTION  05/14/2003   PERMANENT PACEMAKER GENERATOR CHANGE N/A 06/19/2012   Procedure: PERMANENT PACEMAKER GENERATOR CHANGE;  Surgeon: [name], MD; Medtronic Adaptic L dual-chamber pacemaker, serial [id number]     SKIN GRAFT Right 05/13/1960   wrist   TOOTH EXTRACTION  11/10/2020   3 teeth removed    SOCIAL HISTORY: Social History   Socioeconomic History   Marital status: Divorced    Spouse name: Not on file   Number of children: 4   Years of education: Not on file   Highest education level: Not on file  Occupational History   Occupation: ENVIROMENTAL SERVICE    Employer: [employer]   Occupation: retired  Tobacco Use   Smoking status: Former    Packs/day: 0.25    Years: 46.00    Total pack years: 11.50    Types: Cigarettes    Start date: 03/16/1965    Quit date: 06/15/2010    Years since quitting: 12.0   Smokeless tobacco: Never   Tobacco comments:    Former smoker 10/11/21 quit 3 years ago  Vaping Use   Vaping Use: Never used  Substance and Sexual Activity   Alcohol use: Yes    Alcohol/week: 6.0 - 10.0 standard drinks of alcohol    Types: 6 - 10 Glasses of wine per week    Comment: 1-2 glasses every weekend 10/11/21   Drug use: No   Sexual activity: Not on file  Other Topics Concern   Not on file  Social History Narrative   Has relocated from [place] in 2007. 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  Patient in a facility thru [facility]   Social Determinants of Health    Financial Resource Strain: Low Risk  (02/07/2022)   Overall Financial Resource Strain (CARDIA)    Difficulty of Paying Living Expenses: Not hard at all  Food Insecurity: No Food Insecurity (02/28/2022)   Hunger Vital Sign    Worried About Running Out of Food in the Last Year: Never true    Ran Out of Food in the Last Year: Never true  Transportation Needs: No Transportation Needs (02/07/2022)   PRAPARE - Hydrologist (Medical): No    Lack of Transportation (Non-Medical): No  Physical Activity: Sufficiently Active (02/07/2022)   Exercise Vital Sign    Days of Exercise per Week: 7 days    Minutes of Exercise per Session: 50 min  Stress: No Stress Concern Present (02/07/2022)   [place]    Feeling of Stress : Not at all  Social Connections: Socially Isolated (02/07/2022)   Social Connection and Isolation Panel [NHANES]    Frequency of Communication with Friends and Family: More than three times a week    Frequency of Social Gatherings with Friends and Family: More than three times a week    Attends Religious Services: Never    Marine scientist or Organizations: No    Attends Archivist Meetings: Never    Marital Status: Divorced  Human resources officer Violence: Not At Risk (02/07/2022)   Humiliation, Afraid, Rape, and Kick questionnaire    Fear of Current or Ex-Partner: No    Emotionally Abused: No    Physically Abused: No    Sexually Abused: No    FAMILY HISTORY: Family History  Problem Relation Age of Onset   Heart attack Father        Died, 65   Kidney disease Father    Parkinson's disease Mother        Died, 35   Breast cancer Sister        Living, 20   Diabetes type II Brother        Living, 82   Breast cancer Sister        Living, 76   Diabetes Daughter        Living, 22   Diabetes Son        Living, 24   Ovarian cancer Daughter    Colon cancer Neg Hx     Rectal cancer Neg Hx    Stomach cancer Neg Hx     ALLERGIES:  is allergic to lexapro [escitalopram oxalate].  MEDICATIONS:  Current Outpatient Medications  Medication Sig Dispense Refill   acyclovir (ZOVIRAX) 400 MG tablet Take 1 tablet (400 mg total) by mouth 2 (two) times daily. 60 tablet 11   apixaban (ELIQUIS) 5 MG TABS tablet Take 1 tablet (5 mg total) by mouth 2 (two) times daily. 180 tablet 1   colchicine 0.6 MG tablet Take 2 tabs (1.2 mg) now.  Then take 1 tab (0.6 mg) 1 hour  later.  Then take 1 tab daily until gout flare resolves. 14 tablet 0   dexamethasone (DECADRON) 4 MG tablet Take 5 tablets (11m) every 2 weeks on days 8 and 22 starting cycle 2. Repeat every 28 days. 60 tablet 0   diltiazem (TIAZAC) 240 MG 24 hr capsule Take 1 capsule (240 mg total) by mouth daily. 90 capsule 3   flecainide (TAMBOCOR) 150 MG tablet Take 0.5 tablets (75 mg total) by mouth 2 (two) times daily. 90 tablet 3   furosemide (LASIX) 40 MG tablet Take 1 tablet (40 mg total) by mouth daily. 90 tablet 3   irbesartan (AVAPRO) 300 MG tablet Take 1 tablet (300 mg total) by mouth daily. Take 300 mg by mouth daily. 90 tablet 3   metoprolol tartrate (LOPRESSOR) 50 MG tablet Take 1 tablet (50 mg total) by mouth 2 (two) times daily. 180 tablet 3   ondansetron (ZOFRAN) 8 MG tablet Take 1 tablet (8 mg total) by mouth every 8 (eight) hours as needed for nausea or vomiting. 30 tablet 1   polyethylene glycol (MIRALAX / GLYCOLAX) 17 g packet Take 17 g by mouth daily. 100 each 3   pomalidomide (POMALYST) 2 MG capsule Take 1 capsule (2 mg total) by mouth daily. Take 21 days on, 7 days off, repeat every 28 days. 21 capsule 2   prochlorperazine (COMPAZINE) 10 MG tablet Take 1 tablet (10 mg total) by mouth every 6 (six) hours as needed for nausea or vomiting. 30 tablet 1   senna-docusate (SENOKOT-S) 8.6-50 MG tablet Take 2 tablets by mouth 2 (two) times daily as needed for mild constipation. 30 tablet 0   tamsulosin (FLOMAX)  0.4 MG CAPS capsule Take 2 capsules (0.8 mg total) by mouth daily. 90 capsule 3   traMADol (ULTRAM) 50 MG tablet Take 1 tablet (50 mg total) by mouth every 6 (six) hours as needed. 30 tablet 0   No current facility-administered medications for this visit.   Facility-Administered Medications Ordered in Other Visits  Medication Dose Route Frequency Provider Last Rate Last Admin   0.9 %  sodium chloride infusion   Intravenous Once KBrunetta Genera MD       acetaminophen (TYLENOL) tablet 650 mg  650 mg Oral Once KBrunetta Genera MD       dexamethasone (DECADRON) 20 mg in sodium chloride 0.9 % 50 mL IVPB  20 mg Intravenous Once KBrunetta Genera MD       diphenhydrAMINE (BENADRYL) injection 50 mg  50 mg Intravenous Once KBrunetta Genera MD       famotidine (PEPCID) IVPB 20 mg premix  20 mg Intravenous Once KBrunetta Genera MD       isatuximab-irfc ([facility] 900 mg in sodium chloride 0.9 % 205 mL (3.6 mg/mL) chemo infusion  10 mg/kg (Treatment Plan Recorded) Intravenous Once KBrunetta Genera MD       montelukast (SINGULAIR) tablet 10 mg  10 mg Oral Once KBrunetta Genera MD       [[facility] sodium chloride flush (NS) 0.9 % injection 10 mL  10 mL Intracatheter PRN KBrunetta Genera MD        REVIEW OF SYSTEMS:   10 Point review of Systems was done is negative except as noted above.  PHYSICAL EXAMINATION: ECOG PERFORMANCE STATUS: 1 - Symptomatic but completely ambulatory  NAD GENERAL:alert, in no acute distress and comfortable SKIN: no acute rashes, no significant lesions EYES: conjunctiva are pink and non-injected, sclera anicteric NECK: supple,  no JVD LYMPH:  no palpable lymphadenopathy in the cervical regions LUNGS: clear to auscultation b/l with normal respiratory effort HEART: regular rate & rhythm Extremity: no pedal edema MSK: Palpable nodule in the forehead, right shoulder region and low back.  PSYCH: alert & oriented x 3 with fluent  speech NEURO: Decreased passive mobility of right leg.   LABORATORY DATA:  I have reviewed the data as listed .    Latest Ref Rng & Units 07/02/2022   10:20 AM 06/12/2022   11:37 AM 05/15/2022   10:24 AM  CBC  WBC 4.0 - 10.5 K/uL 2.9  4.6  4.2   Hemoglobin 13.0 - 17.0 g/dL 9.3  11.0  11.7   Hematocrit 39.0 - 52.0 % 28.1  32.6  34.5   Platelets 150 - 400 K/uL 166  156  158        Latest Ref Rng & Units 05/15/2022    1:51 PM 03/20/2022    9:45 AM 01/23/2022   10:11 AM  CMP  Glucose 70 - 99 mg/dL 102  104  119   BUN 8 - 23 mg/dL 15  16  19   $ Creatinine 0.61 - 1.24 mg/dL 1.25  1.29  1.21   Sodium 135 - 145 mmol/L 135  139  138   Potassium 3.5 - 5.1 mmol/L 4.0  4.2  3.9   Chloride 98 - 111 mmol/L 103  106  105   CO2 22 - 32 mmol/L 24  27  28   $ Calcium 8.9 - 10.3 mg/dL 8.9  9.3  9.4   Total Protein 6.5 - 8.1 g/dL 6.6  6.6  6.3   Total Bilirubin 0.3 - 1.2 mg/dL 0.7  0.5  0.5   Alkaline Phos 38 - 126 U/L 119  149  143   AST 15 - 41 U/L 16  15  18   $ ALT 0 - 44 U/L 9  9  15       $ 04/17/18 BM Bx:    RADIOGRAPHIC STUDIES: I have personally reviewed the radiological images as listed and agreed with the findings in the report. NM PET Image Restage (PS) Whole Body  Result Date: 06/05/2022 CLINICAL DATA:  Subsequent treatment strategy for multiple myeloma. Back pain. EXAM: NUCLEAR MEDICINE PET WHOLE BODY TECHNIQUE: 10.9 mCi F-18 FDG was injected intravenously. Full-ring PET imaging was performed from the head to foot after the radiotracer. CT data was obtained and used for attenuation correction and anatomic localization. Fasting blood glucose: 103 mg/dl COMPARISON:  PET-CT scans 08/06/2021, 02/06/2021 and 05/03/2018. FINDINGS: Mediastinal blood pool activity: SUV max 2.8 HEAD/NECK: Large soft tissue mass in the posterior paraspinal RIGHT upper back measures 8.3 x 3.7 cm with intense metabolic activity with SUV max equal 13.0. This mass is position between the scapula vertebral body at the  cervicothoracic junction. Mass is new from most recent FDG PET scan however the similar mass on more remote PET scan from 02/06/2021. Incidental CT findings: none CHEST: Hypermetabolic nodules along the parenchymal thickening in the medial RIGHT upper lobe with SUV max equal 7.6. Several hypermetabolic nodules in the periphery of the LEFT lower lobe with SUV max equal 6.3 on image 92) these nodules measure 1 cm each. There are hypermetabolic nodules within the musculature superficial to the LEFT and RIGHT scapula without clear CT correlation. Subcutaneous nodule in the RIGHT back measuring 2 cm with SUV max equal 14.0. Similar hypermetabolic nodules in the RIGHT pectoralis muscle. Incidental CT findings: none ABDOMEN/PELVIS: Single hypermetabolic  focus in the LEFT lobe liver with SUV max equal 5.3 significant above background liver activity. Incidental CT findings: none SKELETON: Multiple foci of intense radiotracer activity within the muscles and subcutaneous tissue. Example midline lesion superficial to the L5 vertebral body with SUV max equal 10.8 measures 2 cm. Minimal skeletal disease. However, lesion in the anterior LEFT upper rib is expansile and hypermetabolic measuring 2 cm SUV max equal 13.4 on image 91. Incidental CT findings: none EXTREMITIES: Hypermetabolic muscular metastasis in the medial distal LEFT thigh with SUV max equal 7.8. Incidental CT findings: none IMPRESSION: 1. 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 2. Bulky soft tissue mass in the high RIGHT neck between the scapula and vertebral bodies is intensely hypermetabolic consistent with plasmacytoma. Lesion new from most recent PET-CT scan but recurrent from PET-CT scan September 2022. 3. New hypermetabolic nodules in the RIGHT upper lobe and LEFT lower lobe. 4. Multiple muscular and subcutaneous lesions throughout the chest abdomen and pelvis. 5. Expansile skeletal lesion  anterior LEFT rib. 6. Single hypermetabolic lesion in the liver consistent with hepatic metastasis. Electronically Signed   By: Suzy Bouchard M.D.   On: 06/05/2022 16:25    ASSESSMENT & PLAN:  Jonathon [name] is a 87 y.o. male who presents to the clinic for follow up for relapsed multiple myeloma.  87 y.o. male with  1.  Relapsed multiple myeloma multiple bone metastases  --Several lytic lesions seen throughout the vertebra, ribs, scapula, hips and pelvis seen on CT CAP from 03/31/2018 and PET scan from 04/23/2018.  --Bone marrow biopsy from 04/21/18 revealed Normocellular bone marrow with Plasma Cell neoplasm. Scattered medium sized clusters of kappa-restricted plasma cells (14% aspirate, 10% CD138 immunohistochemistry) --Patient received first line therapy with Dara/Dex from 05/04/2018-07/26/2019 x 16 cycles.  --Patient started Xgeva therapy from 04/16/2018.Held after 08/03/2020 due to dental issues.  --Held daratumumab after 07/26/2019 after patient reached remission.  --Resumed monthly daratumumab on 06/27/2020 due to rise in M-protein levels.  --PET scan from 02/06/2021 showed progression with new bone lesions. Transitioned to daratumumab every 4 weeks to every 2 weeks. Added Revlimid therapy starting 03/02/2021.  --Received palliative radiation to left chest wall, right scapula, and sacrum from 03/26/2021-04/11/2021.  --M protein from 05/15/2022 was 0.2 g/dL. PET scan from 06/05/2022 showed progression with new bone lesions. Recommend to switch to Isatuximab/Pomalyst/Dexamethasone.   2.  Pulmonary infiltrates  --Likely related to radiation related change.  3. Right leg weakness: --Present for the last 4 weeks.  --Discussed worsening right leg weakness with Dr.  Limbo who reviewed the PET imaging which showed L5 vertebral body lesion.  --We will obtain MRI of the cervical/thoracic/lumbar spine to further evaluation. Scheduled for 07/11/2022 --Provided precautions on for worsening right leg weakness  including numbness, saddle paresthesias, bowel/urine incontinence. Advised to go to ER with above symptoms.  --He is scheduled for a follow up with radiation oncology on 07/11/2022   PLAN:    -Due to start Cycle 1, Day 1 of Isatuximab/Pomalyst/Dexamethasone today -Labs from today were reviewed and adequate for treatment. WBC 2.9, Hgb 9.3. Plt 166, ANC 1.0.  -RTC next week for labs and C1D8. RTC in 2 weeks for labs, follow up visit with Dr.  Limbo prior to [place].   FOLLOW-UP: Per integrated scheduling  All of the patient's questions were answered with apparent satisfaction. The patient knows to call the clinic with any problems, questions or concerns.  I have spent a total of 30 minutes minutes  of face-to-face and non-face-to-face time, preparing to see the patient, performing a medically appropriate examination, counseling and educating the patient, ordering medications/tests/procedures, documenting clinical information in the electronic health record, and care coordination.   Dede Query PA-C Dept of Hematology and [place] at [facility] Phone: [phone]

## 2022-07-02 NOTE — Progress Notes (Signed)
Per Bonnita Nasuti, RN, ok  to treat by provider with an [place] of 1000.   Laray Anger, PharmD PGY-2 Pharmacy Resident Hematology/Oncology [phone]  07/02/2022 11:56 AM

## 2022-07-03 ENCOUNTER — Other Ambulatory Visit (HOSPITAL_COMMUNITY): Payer: Self-pay

## 2022-07-03 ENCOUNTER — Encounter: Payer: Self-pay | Admitting: Rehabilitation

## 2022-07-03 ENCOUNTER — Ambulatory Visit: Payer: Medicare Other | Admitting: Rehabilitation

## 2022-07-03 DIAGNOSIS — C9002 Multiple myeloma in relapse: Secondary | ICD-10-CM | POA: Diagnosis not present

## 2022-07-03 DIAGNOSIS — M6281 Muscle weakness (generalized): Secondary | ICD-10-CM

## 2022-07-03 DIAGNOSIS — R2689 Other abnormalities of gait and mobility: Secondary | ICD-10-CM | POA: Diagnosis not present

## 2022-07-03 DIAGNOSIS — M79604 Pain in right leg: Secondary | ICD-10-CM

## 2022-07-03 DIAGNOSIS — C7951 Secondary malignant neoplasm of bone: Secondary | ICD-10-CM | POA: Diagnosis not present

## 2022-07-03 NOTE — Therapy (Signed)
OUTPATIENT PHYSICAL THERAPY ONCOLOGY TREATMENT  Patient Name: Jonathon [name] MRN: [id number] DOB:1935/09/04, 87 y.o., male Today's Date: 07/03/2022  END OF SESSION:  PT End of Session - 07/03/22 1657     Visit Number 3    Number of Visits 7    Date for PT Re-Evaluation 07/25/22    PT Start Time [id number]    PT Stop Time 1450    PT Time Calculation (min) 47 min    Activity Tolerance Patient tolerated treatment well    Behavior During Therapy WFL for tasks assessed/performed               Past Medical History:  Diagnosis Date   Asthma    BENIGN PROSTATIC HYPERTROPHY, HX OF 10/25/2008   Bone metastases 04/14/2018   BRADYCARDIA 2005   Chronic diastolic congestive heart failure ([place]) 06/05/2016   CIDP (chronic inflammatory demyelinating polyneuropathy) ([place])    HYPERTENSION 11/21/2006   Iron deficiency anemia, unspecified 04/19/2013   Multiple myeloma ([place]) 04/29/2018   NEPHROLITHIASIS, HX OF 11/21/2006 and 10/15/2008   PACEMAKER, PERMANENT 2005   Gen change 2014 Medtronic Adaptic L dual-chamber pacemaker, serial [id number]    SVT (supraventricular tachycardia)    TOBACCO ABUSE 10/25/2008   Quit 2012   Past Surgical History:  Procedure Laterality Date   COLONOSCOPY  2004,2009   PACEMAKER INSERTION  05/14/2003   PERMANENT PACEMAKER GENERATOR CHANGE N/A 06/19/2012   Procedure: PERMANENT PACEMAKER GENERATOR CHANGE;  Surgeon: [name], MD; Medtronic Adaptic L dual-chamber pacemaker, serial [id number]     SKIN GRAFT Right 05/13/1960   wrist   TOOTH EXTRACTION  11/10/2020   3 teeth removed   Patient Active Problem List   Diagnosis Date Noted   Paroxysmal atrial fibrillation ([place]) 10/11/2021   Secondary hypercoagulable state ([place]) 10/11/2021   Counseling regarding advance care planning and goals of care 04/29/2018   Multiple myeloma ([place]) 04/29/2018   Malignant neoplasm metastatic to bone ([place]) 04/14/2018   Hypercalcemia 04/14/2018   Lytic bone lesions on xray 03/29/2018    Displaced fracture of lateral end of right clavicle with routine healing 03/29/2018   Fall 09/05/2017   Sepsis secondary to UTI ([place]) 09/05/2017   Urinary retention 09/05/2017   AKI (acute kidney injury) ([place]) 09/05/2017   CAD (coronary artery disease) 09/05/2017   Superficial vein thrombosis 08/19/2017   Altered mental status    Metabolic encephalopathy [id number]   Dementia ([place]) 08/04/2017   Seizure ([place]) 08/01/2017   Thiamine deficiency 05/27/2017   B12 deficiency 04/30/2017   Alcoholism ([place]) 01/28/2017   Acute encephalopathy 12/28/2016   Hepatic steatosis 06/05/2016   Physical deconditioning 06/05/2016   Chronic diastolic congestive heart failure ([place]) 06/05/2016   Orthostatic syncope    Elevated liver function tests    LOC (loss of consciousness) ([place]) 08/10/2014   CIDP (chronic inflammatory demyelinating polyneuropathy) ([place]) 08/08/2014   Sustained SVT ([place]) 07/27/2014   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 [name]  REFERRING PROVIDER: Dr. Irene Limbo  REFERRING DIAG: C90.01 (ICD-10-CM) - Multiple myeloma relapsed with bone mets  THERAPY DIAG:  Other abnormalities of gait and mobility  Muscle weakness (generalized)  Multiple myeloma in relapse (HCC)  Pain in right leg  ONSET DATE: 05/29/22  Rationale for Evaluation and Treatment: Rehabilitation  SUBJECTIVE:                                                                                                                                                                                            SUBJECTIVE STATEMENT: I felt fine after last time.   EVAL:The weakness in the Rt leg and knee started about a month ago. I was living at an assisted living facility and walking around but now I moved in with my daughter and I think I just don't move anymore.  I can walk with with SPC. I feel like the Rt leg may give out.  I get back pain if I stand long enough to brush my teeth.    PERTINENT HISTORY: recent disease progression with lung recurrence, soft tissue mass in Rt neck between scapula and vertebra, multiple muscular and subcutaneous lesions throughout chest and pelvis, Left rib, and liver, minimal remaining skeletal disease, mass in medial distal left thigh,    HTN, pacemaker, CHF, stage 3 CKD, Afib, Currently on immunotherapy.    PAIN:  Are you having pain? No It hurts mostly at night  NPRS scale: 5/10 Pain location: Rt back of the hip and down to the mid calf  Pain orientation: Right  PAIN TYPE: throbbing Pain description: throbbing  Aggravating factors: it hurts at night  Relieving factors: Getting in the right position   PRECAUTIONS: Active disease, bone mets, fall risk  WEIGHT BEARING RESTRICTIONS: No  FALLS:  Has patient fallen in last 6 months? 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    POSTURE: increased thoracic kyphosis  LOWER  EXTREMITY AROM/PROM:  A/PROM Right eval  Hip flexion Seated flexion around 50%   Hip extension   Hip abduction WNL in seated  Hip adduction WNL in seated  Hip internal rotation   Hip external rotation   Knee flexion   Knee  extension 80%  Ankle dorsiflexion 80%  Ankle plantarflexion WNL  Ankle inversion   Ankle eversion    (Blank rows = not tested)  Left leg all WNL  Deferred MMT due to mets  Sit to stand WNL with use of arm rests on W/C - unable to do repeated stands Sit to supine pt providing minA for his own extremity Unable to lift leg in supine minA to get leg positioned in hooklying  GAIT: Distance walked: 79f with SPC and gait belt CGA daughter pushing WC behind Comments: limited by fear of walking much more  TODAY'S TREATMENT:                                                                                                                                         DATE:  07/03/22:  Transferred WC to mat SBA with WC Seated: Ball squeeze x 15 purple ball Heel pumps x 10 bil Heel slides x 10 bil with TrA LAQ not assisted today due to improved AROM (x 10)  TrA with row AROM x 10 Nustep legs only x769m level 2  Walked x 1043f from table to chair at parallel bars with gait belt and SBA In parallel bars: Heel raises x 5, pregait steps bil x 5, mini squats x 5 repeated x 2 and then rest break Then chair rest x 5mi49mThen side steps x 3 bil x 2 Tandem stance bil 2x15"   Helped pt out to the car safely  PATIENT EDUCATION:  Education details: POC, initial HEP Person educated: Patient and Child(ren) Education method: ExplConsulting civil engineermonstration, and Handouts Education comprehension: verbalized understanding  HOME EXERCISE PROGRAM: [place] did not save: Seated: heel raise, toe raise, LAQ, ball squeeze, march, sit to stand with safety as needed  ASSESSMENT:  CLINICAL IMPRESSION: Pt is showing improved AROM with LAQ today not needing assistance for ROM.   Tried nustep today level 2 which he used to do up to 45 minutes.  He had no increased pain.   OBJECTIVE IMPAIRMENTS: Abnormal gait, decreased balance, decreased mobility, and difficulty walking.   ACTIVITY LIMITATIONS: locomotion level  PARTICIPATION LIMITATIONS: shopping and community activity  PERSONAL FACTORS: Age, Fitness, and 1-2 comorbidities: disease status  are also affecting patient's functional outcome.   REHAB POTENTIAL: Good  CLINICAL DECISION MAKING: Evolving/moderate complexity  EVALUATION COMPLEXITY: Moderate  GOALS: Goals reviewed with patient? Yes  SHORT TERM GOALS = LTGs: Target date: 07/25/22  Pt will be ind with HEP for mobility and prevention of worsening leg weakness Baseline: Goal status: INITIAL  2.  Pt will demonstrate safe mobility with LRAD in his home and in the community Baseline:  Goal status: INITIAL  3.  Pt will be able able to walk 100ft53fthe clinic with LRAD safely to demonstrate improved mobility and home safety Baseline:  Goal status: INITIAL  4.  Pt will transition to HHPT Baseline:  Goal status: INITIAL  PLAN:  PT FREQUENCY: 2x/week  PT DURATION: 3 weeks  PLANNED INTERVENTIONS: Therapeutic exercises, Therapeutic activity, Neuromuscular re-education, Balance training, Gait training, Patient/Family education, Self Care, Manual therapy, and Re-evaluation  PLAN FOR NEXT SESSION:  Nustep legs only, gait (fall risk), seated active mobility, parallel bar activity - transition to HHPT METS: Left thigh muscle, Rt pectoralis, L5, left ribs    Stark Bray, PT 07/03/2022, 4:58 PM

## 2022-07-04 ENCOUNTER — Other Ambulatory Visit (HOSPITAL_COMMUNITY): Payer: Self-pay

## 2022-07-04 ENCOUNTER — Telehealth: Payer: Self-pay | Admitting: *Deleted

## 2022-07-04 ENCOUNTER — Telehealth: Payer: Self-pay | Admitting: Pharmacy Technician

## 2022-07-04 ENCOUNTER — Telehealth: Payer: Self-pay

## 2022-07-04 DIAGNOSIS — R972 Elevated prostate specific antigen [PSA]: Secondary | ICD-10-CM | POA: Diagnosis not present

## 2022-07-04 DIAGNOSIS — N401 Enlarged prostate with lower urinary tract symptoms: Secondary | ICD-10-CM | POA: Diagnosis not present

## 2022-07-04 DIAGNOSIS — R3914 Feeling of incomplete bladder emptying: Secondary | ICD-10-CM | POA: Diagnosis not present

## 2022-07-04 NOTE — Telephone Encounter (Signed)
Oral Oncology Patient Advocate Encounter  Received notification that the request for appeal of the prior authorization for Pomalyst has been denied on 07/02/22     Lady Deutscher, CPhT-Adv Oncology Pharmacy Patient [place] Direct Number: [phone]  Fax: [phone]

## 2022-07-04 NOTE — Telephone Encounter (Signed)
Oral Oncology Pharmacist Encounter   Notified 1st level written appeal and 2nd level verbal appeal denied by patient's insurance for Pomalyst coverage due to patient not having tried a proteasome inhibitor, despite patient having CIDP.   Manufacturer assistance process will be started at this time through [place].     Oral Oncology Clinic will continue to follow.    Leron Croak, PharmD, BCPS, BCOP Hematology/Oncology Clinical Pharmacist [facility] [place] [phone] 07/04/2022 11:43 AM

## 2022-07-04 NOTE — Telephone Encounter (Signed)
Pt monitor not working. He should receive new monitor in 7-10 business days.

## 2022-07-04 NOTE — Telephone Encounter (Signed)
Oral Oncology Patient Advocate Encounter   Began application for assistance for Pomalyst through [employer].   Application will be submitted upon completion of necessary supporting documentation.   [employer] Access [employer] phone number [phone].   I will continue to check the status until final determination.   Lady Deutscher, CPhT-Adv Oncology Pharmacy Patient [place] Direct Number: [phone]  Fax: [phone]

## 2022-07-05 NOTE — Telephone Encounter (Signed)
Oral Oncology Patient Advocate Encounter   Submitted application for assistance for Pomalyst to [employer].   Application submitted via e-fax to [phone]   [employer] Access [employer] phone number [phone]   I will continue to check the status until final determination.   Lady Deutscher, CPhT-Adv Oncology Pharmacy Patient [place] Direct Number: [phone]  Fax: [phone]

## 2022-07-08 ENCOUNTER — Other Ambulatory Visit: Payer: Self-pay

## 2022-07-08 ENCOUNTER — Other Ambulatory Visit: Payer: Self-pay | Admitting: Physician Assistant

## 2022-07-08 ENCOUNTER — Ambulatory Visit: Payer: Medicare Other

## 2022-07-08 ENCOUNTER — Other Ambulatory Visit: Payer: Self-pay | Admitting: Hematology

## 2022-07-08 DIAGNOSIS — M6281 Muscle weakness (generalized): Secondary | ICD-10-CM

## 2022-07-08 DIAGNOSIS — M79604 Pain in right leg: Secondary | ICD-10-CM

## 2022-07-08 DIAGNOSIS — C7951 Secondary malignant neoplasm of bone: Secondary | ICD-10-CM | POA: Diagnosis not present

## 2022-07-08 DIAGNOSIS — C9000 Multiple myeloma not having achieved remission: Secondary | ICD-10-CM

## 2022-07-08 DIAGNOSIS — Z7189 Other specified counseling: Secondary | ICD-10-CM

## 2022-07-08 DIAGNOSIS — C9002 Multiple myeloma in relapse: Secondary | ICD-10-CM | POA: Diagnosis not present

## 2022-07-08 DIAGNOSIS — R29898 Other symptoms and signs involving the musculoskeletal system: Secondary | ICD-10-CM

## 2022-07-08 DIAGNOSIS — R2689 Other abnormalities of gait and mobility: Secondary | ICD-10-CM | POA: Diagnosis not present

## 2022-07-08 DIAGNOSIS — C9001 Multiple myeloma in remission: Secondary | ICD-10-CM

## 2022-07-08 NOTE — Therapy (Signed)
OUTPATIENT PHYSICAL THERAPY ONCOLOGY TREATMENT  Patient Name: Jonathon [name] MRN: [name] DOB:1935-05-15, 87 y.o., male Today's Date: 07/08/2022  END OF SESSION:  PT End of Session - 07/08/22 1409     Visit Number 4    Number of Visits 7    Date for PT Re-Evaluation 07/25/22    PT Start Time [id number]    PT Stop Time 1504    PT Time Calculation (min) 62 min    Activity Tolerance Patient tolerated treatment well    Behavior During Therapy WFL for tasks assessed/performed               Past Medical History:  Diagnosis Date   Asthma    BENIGN PROSTATIC HYPERTROPHY, HX OF 10/25/2008   Bone metastases 04/14/2018   BRADYCARDIA 2005   Chronic diastolic congestive heart failure ([place]) 06/05/2016   CIDP (chronic inflammatory demyelinating polyneuropathy) ([place])    HYPERTENSION 11/21/2006   Iron deficiency anemia, unspecified 04/19/2013   Multiple myeloma ([place]) 04/29/2018   NEPHROLITHIASIS, HX OF 11/21/2006 and 10/15/2008   PACEMAKER, PERMANENT 2005   Gen change 2014 Medtronic Adaptic L dual-chamber pacemaker, serial [id number]    SVT (supraventricular tachycardia)    TOBACCO ABUSE 10/25/2008   Quit 2012   Past Surgical History:  Procedure Laterality Date   COLONOSCOPY  2004,2009   PACEMAKER INSERTION  05/14/2003   PERMANENT PACEMAKER GENERATOR CHANGE N/A 06/19/2012   Procedure: PERMANENT PACEMAKER GENERATOR CHANGE;  Surgeon: [name], MD; Medtronic Adaptic L dual-chamber pacemaker, serial [id number]     SKIN GRAFT Right 05/13/1960   wrist   TOOTH EXTRACTION  11/10/2020   3 teeth removed   Patient Active Problem List   Diagnosis Date Noted   Paroxysmal atrial fibrillation ([place]) 10/11/2021   Secondary hypercoagulable state ([place]) 10/11/2021   Counseling regarding advance care planning and goals of care 04/29/2018   Multiple myeloma ([place]) 04/29/2018   Malignant neoplasm metastatic to bone ([place]) 04/14/2018   Hypercalcemia 04/14/2018   Lytic bone lesions on xray 03/29/2018    Displaced fracture of lateral end of right clavicle with routine healing 03/29/2018   Fall 09/05/2017   Sepsis secondary to UTI ([place]) 09/05/2017   Urinary retention 09/05/2017   AKI (acute kidney injury) ([place]) 09/05/2017   CAD (coronary artery disease) 09/05/2017   Superficial vein thrombosis 08/19/2017   Altered mental status    Metabolic encephalopathy [id number]   Dementia ([place]) 08/04/2017   Seizure ([place]) 08/01/2017   Thiamine deficiency 05/27/2017   B12 deficiency 04/30/2017   Alcoholism ([place]) 01/28/2017   Acute encephalopathy 12/28/2016   Hepatic steatosis 06/05/2016   Physical deconditioning 06/05/2016   Chronic diastolic congestive heart failure ([place]) 06/05/2016   Orthostatic syncope    Elevated liver function tests    LOC (loss of consciousness) ([place]) 08/10/2014   CIDP (chronic inflammatory demyelinating polyneuropathy) ([place]) 08/08/2014   Sustained SVT ([place]) 07/27/2014   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 [name]  REFERRING PROVIDER: Dr. Irene Limbo  REFERRING DIAG: C90.01 (ICD-10-CM) - Multiple myeloma relapsed with bone mets  THERAPY DIAG:  Other abnormalities of gait and mobility  Muscle weakness (generalized)  Multiple myeloma in relapse (HCC)  Pain in right leg  ONSET DATE: 05/29/22  Rationale for Evaluation and Treatment: Rehabilitation  SUBJECTIVE:                                                                                                                                                                                            SUBJECTIVE STATEMENT: I felt fine after last time.   EVAL:The weakness in the Rt leg and knee started about a month ago. I was living at an assisted living facility and walking around but now I moved in with my daughter and I think I just don't move anymore.  I can walk with with SPC. I feel like the Rt leg may give out.  I get back pain if I stand long enough to brush my teeth.    PERTINENT HISTORY: recent disease progression with lung recurrence, soft tissue mass in Rt neck between scapula and vertebra, multiple muscular and subcutaneous lesions throughout chest and pelvis, Left rib, and liver, minimal remaining skeletal disease, mass in medial distal left thigh,    HTN, pacemaker, CHF, stage 3 CKD, Afib, Currently on immunotherapy.    PAIN:  Are you having pain? No, not currently It hurts mostly when I lift my leg to move it NPRS scale: 5/10 Pain location: Rt HS near knee Pain orientation: Right  PAIN TYPE: heavy Pain description: heavy  Aggravating factors: lifting leg Relieving factors: Getting in the right position   PRECAUTIONS: Active disease, bone mets, fall risk  WEIGHT BEARING RESTRICTIONS: No  FALLS:  Has patient fallen in last 6 months? 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    POSTURE: increased thoracic kyphosis  LOWER  EXTREMITY AROM/PROM:  A/PROM Right eval  Hip flexion Seated flexion around 50%   Hip extension   Hip abduction WNL in seated  Hip adduction WNL in seated  Hip internal rotation   Hip external rotation   Knee flexion   Knee extension 80%  Ankle  dorsiflexion 80%  Ankle plantarflexion WNL  Ankle inversion   Ankle eversion    (Blank rows = not tested)  Left leg all WNL  Deferred MMT due to mets  Sit to stand WNL with use of arm rests on W/C - unable to do repeated stands Sit to supine pt providing minA for his own extremity Unable to lift leg in supine minA to get leg positioned in hooklying  GAIT: Distance walked: 83f with SPC and gait belt CGA daughter pushing WC behind Comments: limited by fear of walking much more  TODAY'S TREATMENT:                                                                                                                                         DATE:   07/08/22: Started session by assisting pt from car into WC, WC transfer to Nustep; then ended session by safely getting pt back into car using WC Therapeutic Exercises Nustep seat 12, UE/LE level 6 x4:30 mins, level 4 x 1:30 mins Seated EOB: LAQ assisted Rt LE due to weakness x10 each Ball squeeze 15 reps, 5 sec holds Heel/Toe raises x 20 TrA with row AROM 2 x 10 In parallel bars: Heel raises x 10, pregait steps bil x 5 (Rt LE gave out one time but pt able to catch self with bars and therapist assisted to stand up), mini squats x 7 seated rest break ~3 mins, then repeated  Mini push ups with bars x10 with SBA; seated rest break after x2 mins Bil sidestepping 1x each with small steps, seated rest x1:30 mins then repeated encouraging slightly larger steps but with therapist offering light CGA to suprapatella to prevent Rt LE "going out" though it did not happen here; seated rest break x 2 mins Bil tandem stance x 3, 15 sec holds; seated rest break then [facility]  [facility] HOB Supine with HOB elevated:  Bridges 2 x 5 TrA for marching 2 x 5 each leg, first set with assist to left Rt, second independent Heel slide x 5 each with assist for Rt Assisted pt to bathroom with WC then to car after session   07/03/22:  Transferred WC to mat SBA with WC Seated: Ball squeeze x 15 purple ball Heel pumps x 10 bil Heel slides x 10 bil with TrA LAQ not assisted today due to improved AROM (x 10)  TrA with row AROM x 10 Nustep legs only x735m level 2  Walked x 1063f from table to chair at parallel bars with gait belt and SBA In parallel bars: Heel raises x 5, pregait steps bil x 5, mini squats x 5 repeated x 2 and then rest break Then chair rest x 5mi63mThen side steps x 3 bil x 2 Tandem stance bil 2x15"   Helped pt out to the car  safely  PATIENT EDUCATION:  Education details: POC, initial HEP Person educated: Patient and Child(ren) Education method: Explanation, Demonstration, and Handouts Education comprehension: verbalized understanding  HOME EXERCISE PROGRAM: [employer] did not save: Seated: heel raise, toe raise, LAQ, ball squeeze, march, sit to stand with safety as needed  ASSESSMENT:  CLINICAL IMPRESSION: Continued with strengthening engaging core when able. Pt does well working hard during sessions. Assistance was required for some of Rt LE exercises, see above, due to weakness but pt was able to assist with motion.   OBJECTIVE IMPAIRMENTS: Abnormal gait, decreased balance, decreased mobility, and difficulty walking.   ACTIVITY LIMITATIONS: locomotion level  PARTICIPATION LIMITATIONS: shopping and community activity  PERSONAL FACTORS: Age, Fitness, and 1-2 comorbidities: disease status  are also affecting patient's functional outcome.   REHAB POTENTIAL: Good  CLINICAL DECISION MAKING: Evolving/moderate complexity  EVALUATION COMPLEXITY: Moderate  GOALS: Goals reviewed with patient? Yes  SHORT TERM GOALS = LTGs: Target date:  07/25/22  Pt will be ind with HEP for mobility and prevention of worsening leg weakness Baseline: Goal status: INITIAL  2.  Pt will demonstrate safe mobility with LRAD in his home and in the community Baseline:  Goal status: INITIAL  3.  Pt will be able able to walk 172f in the clinic with LRAD safely to demonstrate improved mobility and home safety Baseline:  Goal status: INITIAL  4.  Pt will transition to HHPT Baseline:  Goal status: INITIAL  PLAN:  PT FREQUENCY: 2x/week  PT DURATION: 3 weeks  PLANNED INTERVENTIONS: Therapeutic exercises, Therapeutic activity, Neuromuscular re-education, Balance training, Gait training, Patient/Family education, Self Care, Manual therapy, and Re-evaluation  PLAN FOR NEXT SESSION:  Nustep legs only, gait (fall risk), seated active mobility, parallel bar activity - transition to HHPT METS: Left thigh muscle, Rt pectoralis, L5, left ribs    ROtelia Limes PTA 07/08/2022, 5:09 PM

## 2022-07-09 MED FILL — Dexamethasone Sodium Phosphate Inj 100 MG/10ML: INTRAMUSCULAR | Qty: 2 | Status: AC

## 2022-07-10 ENCOUNTER — Inpatient Hospital Stay: Payer: Medicare Other

## 2022-07-10 ENCOUNTER — Encounter: Payer: Self-pay | Admitting: Hematology

## 2022-07-10 ENCOUNTER — Other Ambulatory Visit (HOSPITAL_COMMUNITY): Payer: Self-pay

## 2022-07-10 ENCOUNTER — Inpatient Hospital Stay: Payer: Medicare Other | Admitting: Hematology

## 2022-07-10 ENCOUNTER — Other Ambulatory Visit: Payer: Self-pay

## 2022-07-10 VITALS — BP 136/62 | HR 60 | Temp 98.4°F | Resp 18 | Ht 70.0 in | Wt 201.8 lb

## 2022-07-10 DIAGNOSIS — R531 Weakness: Secondary | ICD-10-CM | POA: Diagnosis not present

## 2022-07-10 DIAGNOSIS — Z7189 Other specified counseling: Secondary | ICD-10-CM

## 2022-07-10 DIAGNOSIS — C9002 Multiple myeloma in relapse: Secondary | ICD-10-CM | POA: Diagnosis not present

## 2022-07-10 DIAGNOSIS — Z5112 Encounter for antineoplastic immunotherapy: Secondary | ICD-10-CM | POA: Diagnosis not present

## 2022-07-10 DIAGNOSIS — C9000 Multiple myeloma not having achieved remission: Secondary | ICD-10-CM

## 2022-07-10 DIAGNOSIS — C9001 Multiple myeloma in remission: Secondary | ICD-10-CM

## 2022-07-10 DIAGNOSIS — R918 Other nonspecific abnormal finding of lung field: Secondary | ICD-10-CM | POA: Diagnosis not present

## 2022-07-10 LAB — CBC WITH DIFFERENTIAL (CANCER CENTER ONLY)
Abs Immature Granulocytes: 0.02 10*3/uL (ref 0.00–0.07)
Basophils Absolute: 0.1 10*3/uL (ref 0.0–0.1)
Basophils Relative: 1 %
Eosinophils Absolute: 0.1 10*3/uL (ref 0.0–0.5)
Eosinophils Relative: 3 %
HCT: 30 % — ABNORMAL LOW (ref 39.0–52.0)
Hemoglobin: 10 g/dL — ABNORMAL LOW (ref 13.0–17.0)
Immature Granulocytes: 1 %
Lymphocytes Relative: 34 %
Lymphs Abs: 1.5 10*3/uL (ref 0.7–4.0)
MCH: 28.3 pg (ref 26.0–34.0)
MCHC: 33.3 g/dL (ref 30.0–36.0)
MCV: 85 fL (ref 80.0–100.0)
Monocytes Absolute: 0.8 10*3/uL (ref 0.1–1.0)
Monocytes Relative: 18 %
Neutro Abs: 1.8 10*3/uL (ref 1.7–7.7)
Neutrophils Relative %: 43 %
Platelet Count: 216 10*3/uL (ref 150–400)
RBC: 3.53 MIL/uL — ABNORMAL LOW (ref 4.22–5.81)
RDW: 15.5 % (ref 11.5–15.5)
WBC Count: 4.2 10*3/uL (ref 4.0–10.5)
nRBC: 0 % (ref 0.0–0.2)

## 2022-07-10 LAB — CMP (CANCER CENTER ONLY)
ALT: 10 U/L (ref 0–44)
AST: 22 U/L (ref 15–41)
Albumin: 3.5 g/dL (ref 3.5–5.0)
Alkaline Phosphatase: 93 U/L (ref 38–126)
Anion gap: 10 (ref 5–15)
BUN: 11 mg/dL (ref 8–23)
CO2: 27 mmol/L (ref 22–32)
Calcium: 10.1 mg/dL (ref 8.9–10.3)
Chloride: 99 mmol/L (ref 98–111)
Creatinine: 1.19 mg/dL (ref 0.61–1.24)
GFR, Estimated: >60 mL/min (ref >60)
Glucose, Bld: 93 mg/dL (ref 70–99)
Potassium: 3.7 mmol/L (ref 3.5–5.1)
Sodium: 136 mmol/L (ref 135–145)
Total Bilirubin: 0.6 mg/dL (ref 0.3–1.2)
Total Protein: 7.5 g/dL (ref 6.5–8.1)

## 2022-07-10 MED ORDER — FAMOTIDINE IN NACL 20-0.9 MG/50ML-% IV SOLN
20.0000 mg | Freq: Once | INTRAVENOUS | Status: AC
  Administered 2022-07-10: 20 mg via INTRAVENOUS
  Filled 2022-07-10: qty 50

## 2022-07-10 MED ORDER — MONTELUKAST SODIUM 10 MG PO TABS
10.0000 mg | ORAL_TABLET | Freq: Once | ORAL | Status: AC
  Administered 2022-07-10: 10 mg via ORAL
  Filled 2022-07-10: qty 1

## 2022-07-10 MED ORDER — SODIUM CHLORIDE 0.9 % IV SOLN
10.0000 mg/kg | Freq: Once | INTRAVENOUS | Status: AC
  Administered 2022-07-10: 900 mg via INTRAVENOUS
  Filled 2022-07-10: qty 25

## 2022-07-10 MED ORDER — SODIUM CHLORIDE 0.9 % IV SOLN: Freq: Once | INTRAVENOUS | Status: AC

## 2022-07-10 MED ORDER — DIPHENHYDRAMINE HCL 50 MG/ML IJ SOLN
50.0000 mg | Freq: Once | INTRAMUSCULAR | Status: AC
  Administered 2022-07-10: 50 mg via INTRAVENOUS
  Filled 2022-07-10: qty 1

## 2022-07-10 MED ORDER — SODIUM CHLORIDE 0.9 % IV SOLN
20.0000 mg | Freq: Once | INTRAVENOUS | Status: AC
  Administered 2022-07-10: 20 mg via INTRAVENOUS
  Filled 2022-07-10: qty 20

## 2022-07-10 MED ORDER — ACETAMINOPHEN 325 MG PO TABS
650.0000 mg | ORAL_TABLET | Freq: Once | ORAL | Status: AC
  Administered 2022-07-10: 650 mg via ORAL
  Filled 2022-07-10: qty 2

## 2022-07-10 NOTE — Progress Notes (Signed)
Nursing interview for Malignant neoplasm metastatic to bone [facility]).  Patient identity verified.  Patient reports RT thigh and lower back pain 4/10. No other issues reported at this time.  Meaningful use complete.  BP (!) 142/73 (BP Location: Left Arm, Patient Position: Sitting, Cuff Size: Normal)   Pulse 67   Temp (!) 97.2 F (36.2 C) (Temporal)   Resp 18   Ht '5\' 10"'$  (1.778 m)   Wt 201 lb (91.2 kg)   SpO2 100%   BMI 28.84 kg/m   This concludes the interview.   Leandra Kern, LPN

## 2022-07-10 NOTE — Patient Instructions (Signed)
[facility]  Discharge Instructions: Thank you for choosing [facility] to provide your oncology and hematology care.   If you have a lab appointment with the [facility], please go directly to the [facility] and check in at the registration area.   Wear comfortable clothing and clothing appropriate for easy access to any Portacath or PICC line.   We strive to give you quality time with your provider. You may need to reschedule your appointment if you arrive late (15 or more minutes).  Arriving late affects you and other patients whose appointments are after yours.  Also, if you miss three or more appointments without notifying the office, you may be dismissed from the clinic at the provider's discretion.      For prescription refill requests, have your pharmacy contact our office and allow 72 hours for refills to be completed.    Today you received the following chemotherapy and/or immunotherapy agents: Sarclissa.       To help prevent nausea and vomiting after your treatment, we encourage you to take your nausea medication as directed.  BELOW ARE SYMPTOMS THAT SHOULD BE REPORTED IMMEDIATELY: *FEVER GREATER THAN 100.4 F (38 C) OR HIGHER *CHILLS OR SWEATING *NAUSEA AND VOMITING THAT IS NOT CONTROLLED WITH YOUR NAUSEA MEDICATION *UNUSUAL SHORTNESS OF BREATH *UNUSUAL BRUISING OR BLEEDING *URINARY PROBLEMS (pain or burning when urinating, or frequent urination) *BOWEL PROBLEMS (unusual diarrhea, constipation, pain near the anus) TENDERNESS IN MOUTH AND THROAT WITH OR WITHOUT PRESENCE OF ULCERS (sore throat, sores in mouth, or a toothache) UNUSUAL RASH, SWELLING OR PAIN  UNUSUAL VAGINAL DISCHARGE OR ITCHING   Items with * indicate a potential emergency and should be followed up as soon as possible or go to the Emergency Department if any problems should occur.  Please show the CHEMOTHERAPY ALERT CARD or IMMUNOTHERAPY ALERT CARD at  check-in to the Emergency Department and triage nurse.  Should you have questions after your visit or need to cancel or reschedule your appointment, please contact [facility]  Dept: [phone]  and follow the prompts.  Office hours are 8:00 a.m. to 4:30 p.m. Monday - Friday. Please note that voicemails left after 4:00 p.m. may not be returned until the following business day.  We are closed weekends and major holidays. You have access to a nurse at all times for urgent questions. Please call the main number to the clinic Dept: [phone] and follow the prompts.   For any non-urgent questions, you may also contact your provider using MyChart. We now offer e-Visits for anyone 37 and older to request care online for non-urgent symptoms. For details visit [email or web address].   Also download the MyChart app! Go to the app store, search "MyChart", open the app, select [facility], and log in with your MyChart username and password.

## 2022-07-10 NOTE — Telephone Encounter (Signed)
Oral Oncology Patient Advocate Encounter  Spoke with [employer] to follow up on status of the assistance application. Representative, Arbie Cookey, informed me that when they attempted their benefits investigation, they were notified that the patient's plan was listed with an address showing zip code [phone]. They were unable to proceed with the investigation as this does not match what is on file as the patient's current address.  After speaking with the patient's daughter, [name], I confirmed that the address insurance may have for the patient would be [address]. [place], [place] [address]. I provided this information to [employer] so they may proceed with their benefits investigation process.  I advised [name] that she would need to call the insurance company directly If she wished to correct the address on file.   I will continue to follow up until final determination.   Lady Deutscher, CPhT-Adv Oncology Pharmacy Patient [facility] Direct Number: [phone]  Fax: [phone]

## 2022-07-11 ENCOUNTER — Ambulatory Visit (HOSPITAL_COMMUNITY): Payer: Medicare Other

## 2022-07-11 ENCOUNTER — Ambulatory Visit (HOSPITAL_COMMUNITY)
Admission: RE | Admit: 2022-07-11 | Discharge: 2022-07-11 | Disposition: A | Discharge status: Home / Self Care | Payer: Medicare Other | Source: Ambulatory Visit | Attending: Physician Assistant | Admitting: Physician Assistant

## 2022-07-11 ENCOUNTER — Encounter: Payer: Self-pay | Admitting: Radiation Oncology

## 2022-07-11 ENCOUNTER — Ambulatory Visit
Admission: RE | Admit: 2022-07-11 | Discharge: 2022-07-11 | Disposition: A | Discharge status: Home / Self Care | Payer: Medicare Other | Source: Ambulatory Visit | Attending: Radiation Oncology | Admitting: Radiation Oncology

## 2022-07-11 ENCOUNTER — Other Ambulatory Visit (HOSPITAL_COMMUNITY): Payer: Self-pay | Admitting: Physician Assistant

## 2022-07-11 VITALS — BP 142/73 | HR 67 | Temp 97.2°F | Resp 18 | Ht 70.0 in | Wt 201.0 lb

## 2022-07-11 DIAGNOSIS — R29898 Other symptoms and signs involving the musculoskeletal system: Secondary | ICD-10-CM | POA: Insufficient documentation

## 2022-07-11 DIAGNOSIS — M4802 Spinal stenosis, cervical region: Secondary | ICD-10-CM | POA: Diagnosis not present

## 2022-07-11 DIAGNOSIS — C9000 Multiple myeloma not having achieved remission: Secondary | ICD-10-CM

## 2022-07-11 DIAGNOSIS — C7951 Secondary malignant neoplasm of bone: Secondary | ICD-10-CM

## 2022-07-11 DIAGNOSIS — S32059A Unspecified fracture of fifth lumbar vertebra, initial encounter for closed fracture: Secondary | ICD-10-CM | POA: Diagnosis not present

## 2022-07-11 DIAGNOSIS — R9389 Abnormal findings on diagnostic imaging of other specified body structures: Secondary | ICD-10-CM

## 2022-07-11 MED ORDER — DEXAMETHASONE 4 MG PO TABS: ORAL_TABLET | ORAL | 1 refills | Status: DC

## 2022-07-11 MED ORDER — IOHEXOL 300 MG/ML  SOLN
100.0000 mL | Freq: Once | INTRAMUSCULAR | Status: AC | PRN
  Administered 2022-07-11: 100 mL via INTRAVENOUS

## 2022-07-11 MED ORDER — SODIUM CHLORIDE (PF) 0.9 % IJ SOLN
INTRAMUSCULAR | Status: AC
  Filled 2022-07-11: qty 50

## 2022-07-11 NOTE — Progress Notes (Signed)
Radiation Oncology         ([phone]) [phone] ________________________________  Name: Jonathon [name]        MRN: [name]  Date of Service: 07/11/2022 DOB: 22-Jun-1935  Jonathon [name]:[id number], Jonathon Adie, MD  Jonathon Genera, MD     REFERRING PHYSICIAN: Brunetta Genera, MD   DIAGNOSIS: The encounter diagnosis was Multiple myeloma not having achieved remission (Jonathon [name]).   HISTORY OF PRESENT ILLNESS: Jonathon [name] is a 87 y.o. male with a history of multiple myeloma who presented with a right distal clavicle fracture in November 2019. He underwent work up, and was found to have multiple lytic lesions throughout the skeleton. He had blood work and bone marrow biopsy and was ultimately diagnosed with multiple myeloma in December 2019. He was started on Darzalex.  PET scan on 02/06/21 showed active disease in the left chest wall with hypermetabolism, the right scapula, paraspinal subcutaneous tissue and left gluteus, a nodal metastasis in the gastrohepatic ligament, and the left sacral ala. He began Revlimid and received palliative radiation to the left chest wall at the level of the axilla, the right supraclavicular region including right scapula, and sacral spine including the SI joint on the left in November 2022.   He is seen today due to disease progression noted on PET scan on 06/05/22.  This revealed multifocal progression with significant disease at the base of the right scapula involving the upper chest wall into the low right neck base, and L5 vertebral body new hypermetabolic activity in the right upper and left lower lobe, with multiple muscular and subcutaneous lesions throughout the chest abdomen and pelvis, left rib, and single site disease in the liver.  He is going to switch to a different systemic therapy with Dr. Irene Limbo and has plans to begin Isatuximab +Pomalidomide next week.  He is seen today to consider palliative radiotherapy options.  PREVIOUS RADIATION THERAPY:   03/26/2021 through  04/11/2021 Site Technique Total Dose (Gy) Dose per Fx (Gy) Completed Fx Beam Energies  Thorax: Chest_left chest wall at level of axilla Complex 30/30 3 10/10 6X  Sclav-RT: SCV_Rt including scapula Complex 30/30 3 10/10 6X  Sacrum: Spine_left including SI joint Complex 30/30 3 10/10 6X, 10X    PAST MEDICAL HISTORY:  Past Medical History:  Diagnosis Date   Asthma    BENIGN PROSTATIC HYPERTROPHY, HX OF 10/25/2008   Bone metastases 04/14/2018   BRADYCARDIA 2005   Chronic diastolic congestive heart failure ([place]) 06/05/2016   CIDP (chronic inflammatory demyelinating polyneuropathy) ([place])    HYPERTENSION 11/21/2006   Iron deficiency anemia, unspecified 04/19/2013   Multiple myeloma (Jonathon [name]) 04/29/2018   NEPHROLITHIASIS, HX OF 11/21/2006 and 10/15/2008   PACEMAKER, PERMANENT 2005   Gen change 2014 Medtronic Adaptic L dual-chamber pacemaker, serial [id number]    SVT (supraventricular tachycardia)    TOBACCO ABUSE 10/25/2008   Quit 2012       PAST SURGICAL HISTORY: Past Surgical History:  Procedure Laterality Date   COLONOSCOPY  2004,2009   PACEMAKER INSERTION  05/14/2003   PERMANENT PACEMAKER GENERATOR CHANGE N/A 06/19/2012   Procedure: PERMANENT PACEMAKER GENERATOR CHANGE;  Surgeon: [name], MD; Medtronic Adaptic L dual-chamber pacemaker, serial [id number]     SKIN GRAFT Right 05/13/1960   wrist   TOOTH EXTRACTION  11/10/2020   3 teeth removed     FAMILY HISTORY:  Family History  Problem Relation Age of Onset   Heart attack Father        Died, 65   Kidney  disease Father    Parkinson's disease Mother        Died, 33   Breast cancer Sister        Living, 69   Diabetes type II Brother        Living, 36   Breast cancer Sister        Living, 28   Diabetes Daughter        Living, 23   Diabetes Son        Living, 88   Ovarian cancer Daughter    Colon cancer Neg Hx    Rectal cancer Neg Hx    Stomach cancer Neg Hx      SOCIAL HISTORY:  reports that he quit smoking about  12 years ago. His smoking use included cigarettes. He started smoking about 57 years ago. He has a 11.50 pack-year smoking history. He has never used smokeless tobacco. He reports current alcohol use of about 6.0 - 10.0 standard drinks of alcohol per week. He reports that he does not use drugs.  The patient is divorced and lives in [place].  He is retired from working in a Loss adjuster, chartered and had environmental exposures to multiple chemicals over the years.  His daughter [name] is an Systems developer at [employer].  They enjoy spending time together, he enjoys reading and staying mentally active as well.   ALLERGIES: Lexapro [escitalopram oxalate]   MEDICATIONS:  Current Outpatient Medications  Medication Sig Dispense Refill   dexamethasone (DECADRON) 4 MG tablet Take one tablet by mouth twice daily until otherwise instructed by RADIATION providers 60 tablet 1   acyclovir (ZOVIRAX) 400 MG tablet Take 1 tablet (400 mg total) by mouth 2 (two) times daily. 60 tablet 11   apixaban (ELIQUIS) 5 MG TABS tablet Take 1 tablet (5 mg total) by mouth 2 (two) times daily. 180 tablet 1   colchicine 0.6 MG tablet Take 2 tabs (1.2 mg) now.  Then take 1 tab (0.6 mg) 1 hour later.  Then take 1 tab daily until gout flare resolves. 14 tablet 0   dexamethasone (DECADRON) 4 MG tablet Take 5 tablets ('20mg'$ ) every 2 weeks on days 8 and 22 starting cycle 2. Repeat every 28 days. 60 tablet 0   diltiazem (TIAZAC) 240 MG 24 hr capsule Take 1 capsule (240 mg total) by mouth daily. 90 capsule 3   flecainide (TAMBOCOR) 150 MG tablet Take 0.5 tablets (75 mg total) by mouth 2 (two) times daily. 90 tablet 3   furosemide (LASIX) 40 MG tablet Take 1 tablet (40 mg total) by mouth daily. 90 tablet 3   irbesartan (AVAPRO) 300 MG tablet Take 1 tablet (300 mg total) by mouth daily. Take 300 mg by mouth daily. 90 tablet 3   metoprolol tartrate (LOPRESSOR) 50 MG tablet Take 1 tablet (50 mg total) by mouth 2 (two)  times daily. 180 tablet 3   ondansetron (ZOFRAN) 8 MG tablet Take 1 tablet (8 mg total) by mouth every 8 (eight) hours as needed for nausea or vomiting. 30 tablet 1   polyethylene glycol (MIRALAX / GLYCOLAX) 17 g packet Take 17 g by mouth daily. 100 each 3   pomalidomide (POMALYST) 2 MG capsule Take 1 capsule (2 mg total) by mouth daily. Take 21 days on, 7 days off, repeat every 28 days. 21 capsule 2   prochlorperazine (COMPAZINE) 10 MG tablet Take 1 tablet (10 mg total) by mouth every 6 (six) hours as needed for nausea or vomiting. 30 tablet  1   senna-docusate (SENOKOT-S) 8.6-50 MG tablet Take 2 tablets by mouth 2 (two) times daily as needed for mild constipation. 30 tablet 0   tamsulosin (FLOMAX) 0.4 MG CAPS capsule Take 2 capsules (0.8 mg total) by mouth daily. 90 capsule 3   traMADol (ULTRAM) 50 MG tablet Take 1 tablet (50 mg total) by mouth every 6 (six) hours as needed. 30 tablet 0   No current facility-administered medications for this encounter.   Facility-Administered Medications Ordered in Other Encounters  Medication Dose Route Frequency Provider Last Rate Last Admin   sodium chloride (PF) 0.9 % injection            [MAR Hold] sodium chloride flush (NS) 0.9 % injection 10 mL  10 mL Intracatheter PRN Jonathon Genera, MD         REVIEW OF SYSTEMS: On review of systems, The patient reports that experiencing right lower extremity weakness since the middle of January.  He has been more unsteady on his feet and takes smaller steps.  He denies foot drop or loss of sensation of his lower extremity, as well as denies loss of control of his bowel or bladder function.  He reports that he has noticed multiple nodules throughout the low and upper back as well as along his forehead none of them have eroded through the skin.  He states he is also had significant discomfort in the upper right posterior shoulder region all of these areas corresponding to the findings on his PET imaging.  He has  Ultram as needed for pain management which seems to be enough to help him remain functional.  No other complaints are verbalized.     PHYSICAL EXAM:  Wt Readings from Last 3 Encounters:  07/11/22 201 lb (91.2 kg)  07/10/22 201 lb 12.8 oz (91.5 kg)  07/02/22 205 lb 8 oz (93.2 kg)   Temp Readings from Last 3 Encounters:  07/11/22 (!) 97.2 F (36.2 C) (Temporal)  07/10/22 98.4 F (36.9 C) (Oral)  07/02/22 98.4 F (36.9 C) (Oral)   BP Readings from Last 3 Encounters:  07/11/22 (!) 142/73  07/10/22 136/62  07/02/22 (!) 125/56   Pulse Readings from Last 3 Encounters:  07/11/22 67  07/10/22 60  07/02/22 60   Pain Assessment Pain Score: 4  (RT thigh and lower back)/10  In general this is a well appearing African American male in no acute distress. He's alert and oriented x4 and appropriate throughout the examination. Cardiopulmonary assessment is negative for acute distress and he exhibits normal effort.  There is a lobulated lesion that is visible from the base of his scalp along the midline that is approximately 4 cm in greatest dimension.  The more posterior aspect of this is approximately 2 cm in greatest dimension.  No evidence of erosion or punctated features are appreciated and the patient does not have pain with light palpation or admit to pain outside of the area being touched or examined.  He does report notable nodules along the lower back posteriorly and into his right groin, and we were able to palpate the area in his posterior left thigh and posterior left scapular region.  Assessment of his lower extremities however reveals 4 out of 5 strength in his right lower extremity with intact sensation to light touch bilaterally and 5 out of 5 strength in the left lower extremity.     ECOG = 1  0 - Asymptomatic (Fully active, able to carry on all predisease activities without  restriction)  1 - Symptomatic but completely ambulatory (Restricted in physically strenuous activity but  ambulatory and able to carry out work of a light or sedentary nature. For example, light housework, office work)  2 - Symptomatic, <50% in bed during the day (Ambulatory and capable of all self care but unable to carry out any work activities. Up and about more than 50% of waking hours)  3 - Symptomatic, >50% in bed, but not bedbound (Capable of only limited self-care, confined to bed or chair 50% or more of waking hours)  4 - Bedbound (Completely disabled. Cannot carry on any self-care. Totally confined to bed or chair)  5 - Death   Eustace Pen MM, Creech RH, Tormey DC, et al. [phone]). "Toxicity and response criteria of the [facility]". [place] Oncol. 5 (6): 649-55    LABORATORY DATA:  Lab Results  Component Value Date   WBC 4.2 07/10/2022   HGB 10.0 (L) 07/10/2022   HCT 30.0 (L) 07/10/2022   MCV 85.0 07/10/2022   PLT 216 07/10/2022   Lab Results  Component Value Date   NA 136 07/10/2022   K 3.7 07/10/2022   CL 99 07/10/2022   CO2 27 07/10/2022   Lab Results  Component Value Date   ALT 10 07/10/2022   AST 22 07/10/2022   ALKPHOS 93 07/10/2022   BILITOT 0.6 07/10/2022      RADIOGRAPHY: CT LUMBAR SPINE W CONTRAST  Result Date: 07/11/2022 CLINICAL DATA:  Progressive myeloma EXAM: CT THORACIC SPINE WITH CONTRAST; CT LUMBAR SPINE WITH CONTRAST; CT CERVICAL SPINE WITH CONTRAST TECHNIQUE: Multidetector CT images of cervical, thoracic, and lumbar was performed according to the standard protocol following intravenous contrast administration. RADIATION DOSE REDUCTION: This exam was performed according to the departmental dose-optimization program which includes automated exposure control, adjustment of the mA and/or kV according to patient size and/or use of iterative reconstruction technique. CONTRAST:  139m OMNIPAQUE IOHEXOL 300 MG/ML  SOLN COMPARISON:  PET CT 06/05/22 FINDINGS: Alignment: Normal. Vertebrae: Cervical spine: There are multiple lytic lesions  throughout the cervical spine. There is no evidence of a pathologic fracture. No definite evidence of epidural spread of tumor, but note that CT is markedly limited assess for subtle epidural extension of tumor. Likely at least moderate spinal canal stenosis at C5-C6. Thoracic spine: There are innumerable lytic lesions throughout the thoracic spine involving nearly every thoracic vertebral body. There is no evidence of a pathologic fracture. There is likely evidence of epidural extension of tumor at the T1 vertebral body level (series 2, image 68). Lumbar spine: Redemonstrated is heterogeneous appearance of the bilateral femoral heads and lucent appearance of the bilateral acetabula. These regions did not demonstrate FDG uptake on recent prior PET-CT. Redemonstrated are destructive lesions in the sacrum bilaterally (series 2, image 96), unchanged from recent prior PET-CT. There is a fracture through the right L5 transverse process (series 2, image 76). This is age indeterminate, but likely new compared to 06/05/2022. The lesion along the right aspect obliterates the right L5-S1 neural foramen. There are multiple lytic lesions throughout the lumbar spine involving nearly every lumbar vertebral body level, as seen on recent prior PET-CT. There appears to be abnormal soft tissue within the ventral portion of the spinal canal at the L4-L5 level. This is nonspecific and could represent disc material. There are innumerable soft tissue density nodules in the subcutaneous soft tissues overlying the lumbar spine (series 3, image 44-114). These regions demonstrated FDG uptake on recent prior  PET-CT. Paraspinal and other soft tissues: Heterogeneous and multinodular thyroid gland. There is marked interval increase in size of the soft tissue density mass along the right subscapularis muscle body which now also extends along the pleura. This lesion also involves the right scapula. Compared to prior exam there is increased  superior extension of the mass which now extends to involve the lower paraspinal musculature of the neck. Redemonstrated are extensive bilateral solid pulmonary nodules which have also increased in size, particularly in the left lung. There is a low-density filling defect in the left upper lobe lobar pulmonary artery (series 3, image 54) suspicious for a PE. Compared to prior exam there is a new low density lesions in the right hepatic lobe, which is worrisome for new hepatic disease. There is extensive soft tissue nodularity in the peritoneum in the retroperitoneum, that has progressed compared to prior PET-CT, particularly in the region of the right iliopsoas muscle (series 3, image 80). There is redemonstration of soft tissue nodularity along the left psoas and iliacus muscle. There is new retroperitoneal nodularity along the right ureter (series 3, image 50). There is interval increase in size of a soft tissue nodule adjacent to the pancreatic tail which now measures up to 1.1 x 1.6 cm, previously 0.8 x 1.0 cm. There is cholelithiasis without cholecystitis. Compared to prior exam there is apparent increased circumferential bladder wall thickening, which may be secondary to differences in technique, but evaluation with urinalysis is recommended to exclude the possibility of superimposed infection. IMPRESSION: Extensive osseous and extra osseous myeloma, breast compared to recent prior PET-CT dated 06/05/2022. 1. New low-density filling defect in the left upper lobe lobar pulmonary artery, suspicious for PE. Recommend further evaluation with dedicated CT PE protocol study. 2. Innumerable lytic lesions throughout the cervical, thoracic and lumbar spine, as well as the sacrum as seen on recent prior PET-CT. There is likely evidence of epidural extension of tumor at the T1 vertebral body level. No evidence of pathologic fracture. The lesion along the right aspect of the sacrum obliterates the right L5-S1 neural  foramen. 3. There is a fracture through the right L5 transverse process, which is age indeterminate, but likely new compared to 06/05/2022. 4. There is marked interval increase in size of the soft tissue density mass along the right subscapularis muscle body which now extends to involve the right scapula. There is increased superior extension of the mass which now extends to involve the lower paraspinal musculature of the neck. 5. Compared to prior PET-CT there has been interval increase in size of extensive bilateral solid pulmonary nodules, particularly in the left lung. 6. There is a new low density lesions in the right hepatic. These results will be called to the ordering clinician or representative by the Radiologist Assistant, and communication documented in the PACS or [employer]. Electronically Signed   By: Marin Roberts M.D.   On: 07/11/2022 16:47   CT THORACIC SPINE W CONTRAST  Result Date: 07/11/2022 CLINICAL DATA:  Progressive myeloma EXAM: CT THORACIC SPINE WITH CONTRAST; CT LUMBAR SPINE WITH CONTRAST; CT CERVICAL SPINE WITH CONTRAST TECHNIQUE: Multidetector CT images of cervical, thoracic, and lumbar was performed according to the standard protocol following intravenous contrast administration. RADIATION DOSE REDUCTION: This exam was performed according to the departmental dose-optimization program which includes automated exposure control, adjustment of the mA and/or kV according to patient size and/or use of iterative reconstruction technique. CONTRAST:  115m OMNIPAQUE IOHEXOL 300 MG/ML  SOLN COMPARISON:  PET CT 06/05/22 FINDINGS:  Alignment: Normal. Vertebrae: Cervical spine: There are multiple lytic lesions throughout the cervical spine. There is no evidence of a pathologic fracture. No definite evidence of epidural spread of tumor, but note that CT is markedly limited assess for subtle epidural extension of tumor. Likely at least moderate spinal canal stenosis at C5-C6. Thoracic spine:  There are innumerable lytic lesions throughout the thoracic spine involving nearly every thoracic vertebral body. There is no evidence of a pathologic fracture. There is likely evidence of epidural extension of tumor at the T1 vertebral body level (series 2, image 68). Lumbar spine: Redemonstrated is heterogeneous appearance of the bilateral femoral heads and lucent appearance of the bilateral acetabula. These regions did not demonstrate FDG uptake on recent prior PET-CT. Redemonstrated are destructive lesions in the sacrum bilaterally (series 2, image 96), unchanged from recent prior PET-CT. There is a fracture through the right L5 transverse process (series 2, image 76). This is age indeterminate, but likely new compared to 06/05/2022. The lesion along the right aspect obliterates the right L5-S1 neural foramen. There are multiple lytic lesions throughout the lumbar spine involving nearly every lumbar vertebral body level, as seen on recent prior PET-CT. There appears to be abnormal soft tissue within the ventral portion of the spinal canal at the L4-L5 level. This is nonspecific and could represent disc material. There are innumerable soft tissue density nodules in the subcutaneous soft tissues overlying the lumbar spine (series 3, image 44-114). These regions demonstrated FDG uptake on recent prior PET-CT. Paraspinal and other soft tissues: Heterogeneous and multinodular thyroid gland. There is marked interval increase in size of the soft tissue density mass along the right subscapularis muscle body which now also extends along the pleura. This lesion also involves the right scapula. Compared to prior exam there is increased superior extension of the mass which now extends to involve the lower paraspinal musculature of the neck. Redemonstrated are extensive bilateral solid pulmonary nodules which have also increased in size, particularly in the left lung. There is a low-density filling defect in the left upper  lobe lobar pulmonary artery (series 3, image 54) suspicious for a PE. Compared to prior exam there is a new low density lesions in the right hepatic lobe, which is worrisome for new hepatic disease. There is extensive soft tissue nodularity in the peritoneum in the retroperitoneum, that has progressed compared to prior PET-CT, particularly in the region of the right iliopsoas muscle (series 3, image 80). There is redemonstration of soft tissue nodularity along the left psoas and iliacus muscle. There is new retroperitoneal nodularity along the right ureter (series 3, image 50). There is interval increase in size of a soft tissue nodule adjacent to the pancreatic tail which now measures up to 1.1 x 1.6 cm, previously 0.8 x 1.0 cm. There is cholelithiasis without cholecystitis. Compared to prior exam there is apparent increased circumferential bladder wall thickening, which may be secondary to differences in technique, but evaluation with urinalysis is recommended to exclude the possibility of superimposed infection. IMPRESSION: Extensive osseous and extra osseous myeloma, breast compared to recent prior PET-CT dated 06/05/2022. 1. New low-density filling defect in the left upper lobe lobar pulmonary artery, suspicious for PE. Recommend further evaluation with dedicated CT PE protocol study. 2. Innumerable lytic lesions throughout the cervical, thoracic and lumbar spine, as well as the sacrum as seen on recent prior PET-CT. There is likely evidence of epidural extension of tumor at the T1 vertebral body level. No evidence of pathologic fracture. The lesion  along the right aspect of the sacrum obliterates the right L5-S1 neural foramen. 3. There is a fracture through the right L5 transverse process, which is age indeterminate, but likely new compared to 06/05/2022. 4. There is marked interval increase in size of the soft tissue density mass along the right subscapularis muscle body which now extends to involve the  right scapula. There is increased superior extension of the mass which now extends to involve the lower paraspinal musculature of the neck. 5. Compared to prior PET-CT there has been interval increase in size of extensive bilateral solid pulmonary nodules, particularly in the left lung. 6. There is a new low density lesions in the right hepatic. These results will be called to the ordering clinician or representative by the Radiologist Assistant, and communication documented in the PACS or [employer]. Electronically Signed   By: Marin Roberts M.D.   On: 07/11/2022 16:47   CT Cervical Spine W Contrast  Result Date: 07/11/2022 CLINICAL DATA:  Progressive myeloma EXAM: CT THORACIC SPINE WITH CONTRAST; CT LUMBAR SPINE WITH CONTRAST; CT CERVICAL SPINE WITH CONTRAST TECHNIQUE: Multidetector CT images of cervical, thoracic, and lumbar was performed according to the standard protocol following intravenous contrast administration. RADIATION DOSE REDUCTION: This exam was performed according to the departmental dose-optimization program which includes automated exposure control, adjustment of the mA and/or kV according to patient size and/or use of iterative reconstruction technique. CONTRAST:  167m OMNIPAQUE IOHEXOL 300 MG/ML  SOLN COMPARISON:  PET CT 06/05/22 FINDINGS: Alignment: Normal. Vertebrae: Cervical spine: There are multiple lytic lesions throughout the cervical spine. There is no evidence of a pathologic fracture. No definite evidence of epidural spread of tumor, but note that CT is markedly limited assess for subtle epidural extension of tumor. Likely at least moderate spinal canal stenosis at C5-C6. Thoracic spine: There are innumerable lytic lesions throughout the thoracic spine involving nearly every thoracic vertebral body. There is no evidence of a pathologic fracture. There is likely evidence of epidural extension of tumor at the T1 vertebral body level (series 2, image 68). Lumbar spine:  Redemonstrated is heterogeneous appearance of the bilateral femoral heads and lucent appearance of the bilateral acetabula. These regions did not demonstrate FDG uptake on recent prior PET-CT. Redemonstrated are destructive lesions in the sacrum bilaterally (series 2, image 96), unchanged from recent prior PET-CT. There is a fracture through the right L5 transverse process (series 2, image 76). This is age indeterminate, but likely new compared to 06/05/2022. The lesion along the right aspect obliterates the right L5-S1 neural foramen. There are multiple lytic lesions throughout the lumbar spine involving nearly every lumbar vertebral body level, as seen on recent prior PET-CT. There appears to be abnormal soft tissue within the ventral portion of the spinal canal at the L4-L5 level. This is nonspecific and could represent disc material. There are innumerable soft tissue density nodules in the subcutaneous soft tissues overlying the lumbar spine (series 3, image [id number]). These regions demonstrated FDG uptake on recent prior PET-CT. Paraspinal and other soft tissues: Heterogeneous and multinodular thyroid gland. There is marked interval increase in size of the soft tissue density mass along the right subscapularis muscle body which now also extends along the pleura. This lesion also involves the right scapula. Compared to prior exam there is increased superior extension of the mass which now extends to involve the lower paraspinal musculature of the neck. Redemonstrated are extensive bilateral solid pulmonary nodules which have also increased in size, particularly in the left lung.  There is a low-density filling defect in the left upper lobe lobar pulmonary artery (series 3, image 54) suspicious for a PE. Compared to prior exam there is a new low density lesions in the right hepatic lobe, which is worrisome for new hepatic disease. There is extensive soft tissue nodularity in the peritoneum in the retroperitoneum,  that has progressed compared to prior PET-CT, particularly in the region of the right iliopsoas muscle (series 3, image 80). There is redemonstration of soft tissue nodularity along the left psoas and iliacus muscle. There is new retroperitoneal nodularity along the right ureter (series 3, image 50). There is interval increase in size of a soft tissue nodule adjacent to the pancreatic tail which now measures up to 1.1 x 1.6 cm, previously 0.8 x 1.0 cm. There is cholelithiasis without cholecystitis. Compared to prior exam there is apparent increased circumferential bladder wall thickening, which may be secondary to differences in technique, but evaluation with urinalysis is recommended to exclude the possibility of superimposed infection. IMPRESSION: Extensive osseous and extra osseous myeloma, breast compared to recent prior PET-CT dated 06/05/2022. 1. New low-density filling defect in the left upper lobe lobar pulmonary artery, suspicious for PE. Recommend further evaluation with dedicated CT PE protocol study. 2. Innumerable lytic lesions throughout the cervical, thoracic and lumbar spine, as well as the sacrum as seen on recent prior PET-CT. There is likely evidence of epidural extension of tumor at the T1 vertebral body level. No evidence of pathologic fracture. The lesion along the right aspect of the sacrum obliterates the right L5-S1 neural foramen. 3. There is a fracture through the right L5 transverse process, which is age indeterminate, but likely new compared to 06/05/2022. 4. There is marked interval increase in size of the soft tissue density mass along the right subscapularis muscle body which now extends to involve the right scapula. There is increased superior extension of the mass which now extends to involve the lower paraspinal musculature of the neck. 5. Compared to prior PET-CT there has been interval increase in size of extensive bilateral solid pulmonary nodules, particularly in the left lung.  6. There is a new low density lesions in the right hepatic. These results will be called to the ordering clinician or representative by the Radiologist Assistant, and communication documented in the PACS or [employer]. Electronically Signed   By: Marin Roberts M.D.   On: 07/11/2022 16:47       IMPRESSION/PLAN: 1. Multiple myeloma. Dr. Lisbeth Renshaw is aware of the patient's course treatment to date.  I shared with the patient and his daughter that discussion that Dr. Lisbeth Renshaw and I have had about his PET scan images and the rationale for considering palliative radiotherapy.  Dr. Lisbeth Renshaw would anticipate a role for palliative radiation to the posterior right scapular/lower neck region as well as the L5 region extending to the right aspect of the sacrum.  We discussed the risks, benefits, short, and long term effects of radiotherapy, as well as the palliative intent, and the patient is interested in proceeding.  I reviewed the delivery and logistics of radiotherapy and Dr. Ida Rogue recommendation of 2 weeks of treatment to the sites named above.  Written consent is obtained and placed in the chart, a copy was provided to the patient.  He will simulate today. 2. Lower extremity weakness.  Based on the patient's examination today and is concerned that he may have compression of his spinal nerve root.  While his PET scan last month did not show  evidence of cord compression, however we will begin steroids for him in this setting. Dexamethasone 4 mg BID is recommended. The prescription I sent in for was not covered by insurance so he will use his current prescription of dexamethasone with instructions for twice daily use of 4 mg tablets as outlined.  The patient has this written on AVS as well.  We will coordinate the tapering of this as well moving forward. 3. In situ pacemaker.  We will reach out to Dr. Lovena Le, the patient's cardiologist for permission to pursue treatment again. The 2 of the areas to be treated are greater  than 10 cm away from his device. We will follow this closely and work with Dr. Lovena Le regarding proceeding with radiation.  In a visit lasting 45 minutes, greater than 50% of the time was spent face to face discussing the patient's condition, in preparation for the discussion, and coordinating the patient's care.      Carola Rhine, [facility]   **Disclaimer: This note was dictated with voice recognition software. Similar sounding words can inadvertently be transcribed and this note may contain transcription errors which may not have been corrected upon publication of note.**

## 2022-07-12 ENCOUNTER — Telehealth: Payer: Self-pay

## 2022-07-12 ENCOUNTER — Ambulatory Visit: Payer: Medicare Other

## 2022-07-12 ENCOUNTER — Encounter (HOSPITAL_COMMUNITY): Payer: Self-pay

## 2022-07-12 ENCOUNTER — Ambulatory Visit (HOSPITAL_COMMUNITY)
Admission: RE | Admit: 2022-07-12 | Discharge: 2022-07-12 | Disposition: A | Discharge status: Home / Self Care | Payer: Medicare Other | Source: Ambulatory Visit | Attending: Physician Assistant | Admitting: Physician Assistant

## 2022-07-12 ENCOUNTER — Encounter: Payer: Self-pay | Admitting: Internal Medicine

## 2022-07-12 DIAGNOSIS — R9389 Abnormal findings on diagnostic imaging of other specified body structures: Secondary | ICD-10-CM

## 2022-07-12 DIAGNOSIS — C9000 Multiple myeloma not having achieved remission: Secondary | ICD-10-CM | POA: Diagnosis not present

## 2022-07-12 MED ORDER — SODIUM CHLORIDE (PF) 0.9 % IJ SOLN
INTRAMUSCULAR | Status: AC
  Filled 2022-07-12: qty 50

## 2022-07-12 MED ORDER — IOHEXOL 350 MG/ML SOLN
80.0000 mL | Freq: Once | INTRAVENOUS | Status: AC | PRN
  Administered 2022-07-12: 80 mL via INTRAVENOUS

## 2022-07-12 NOTE — Progress Notes (Signed)
TO BE COMPLETED BY RADIATION ONCOLOGIST OFFICE:   Patient Name: Jonathon [name]   Date of Birth: Sep 12, 1935   Radiation Oncologist: Dr. Lisbeth Renshaw   Site to be Treated: Spine-L5, Right Shoulder/neck base   Will x-rays >10 MV be used? No   Will the radiation be >10 cm from the device? Yes   Planned Treatment Start Date: 07/15/2022   TO BE COMPLETED BY CARDIOLOGIST OFFICE:   Device Information:  Pacemaker '[x]'$      ICD '[]'$    Brand: [employer]onic: [phone] Model #: [id number]  Serial Number: [id number]    Date of Placement: 06/19/2012  Site of Placement: left chest  Remote Device Check--Frequency: q 91 days Last Check: 03/19/2022  Is the Patient Pacer Dependent?:  Yes '[]'$   No '[x]'$   Does cardiologist request Radiation Oncology to schedule device testing by vendor for the following:  Prior to the Initiation of Treatments?  Yes '[]'$  No '[x]'$  During Treatments?  Yes '[]'$  No '[x]'$  Post Radiation Treatments?  Yes '[]'$  No '[x]'$   Is device monitoring necessary by vendor/cardiologist team during treatments?  Yes '[]'$   No '[x]'$   Is cardiac monitoring by Radiation Oncology nursing necessary during treatments? Yes '[]'$   No '[x]'$   Do you recommend device be relocated prior to Radiation Treatment? Yes '[]'$   No '[x]'$   **PLEASE LIST ANY NOTES OR SPECIAL REQUESTS:       CARDIOLOGIST SIGNATURE:  Dr. Cristopher Peru Per [facility] Standing Orders, Damian Leavell  07/12/2022 10:44 AM  **Please route completed form back to Radiation Oncology Nursing and "[place]", OR send an update if there will be a delay in having form completed by expected start date.  **Call [phone] if you have any questions or do not get an in-basket response from a Radiation Oncology staff member

## 2022-07-12 NOTE — Telephone Encounter (Signed)
please schedule STAT CTA chest   IT  STAT CT scheduled for today 07/12/22 and per scheduling pt can come on now to be worked in.  Spoke with [name], pt's daughter, and advised her to take him on to [facility] imaging for a STAT chest CT to r/out PE.    Daughter agreed to plan.

## 2022-07-14 DIAGNOSIS — Z510 Encounter for antineoplastic radiation therapy: Secondary | ICD-10-CM | POA: Insufficient documentation

## 2022-07-14 DIAGNOSIS — C7801 Secondary malignant neoplasm of right lung: Secondary | ICD-10-CM | POA: Insufficient documentation

## 2022-07-14 DIAGNOSIS — Z5112 Encounter for antineoplastic immunotherapy: Secondary | ICD-10-CM | POA: Diagnosis not present

## 2022-07-14 DIAGNOSIS — C7951 Secondary malignant neoplasm of bone: Secondary | ICD-10-CM | POA: Insufficient documentation

## 2022-07-14 DIAGNOSIS — C9000 Multiple myeloma not having achieved remission: Secondary | ICD-10-CM | POA: Insufficient documentation

## 2022-07-15 ENCOUNTER — Other Ambulatory Visit: Payer: Self-pay

## 2022-07-15 ENCOUNTER — Ambulatory Visit
Admission: RE | Admit: 2022-07-15 | Discharge: 2022-07-15 | Disposition: A | Discharge status: Home / Self Care | Payer: Medicare Other | Source: Ambulatory Visit | Attending: Radiation Oncology | Admitting: Radiation Oncology

## 2022-07-15 DIAGNOSIS — C7801 Secondary malignant neoplasm of right lung: Secondary | ICD-10-CM | POA: Diagnosis not present

## 2022-07-15 DIAGNOSIS — Z5112 Encounter for antineoplastic immunotherapy: Secondary | ICD-10-CM | POA: Diagnosis not present

## 2022-07-15 DIAGNOSIS — C9000 Multiple myeloma not having achieved remission: Secondary | ICD-10-CM | POA: Diagnosis not present

## 2022-07-15 DIAGNOSIS — C7951 Secondary malignant neoplasm of bone: Secondary | ICD-10-CM | POA: Diagnosis not present

## 2022-07-15 DIAGNOSIS — Z510 Encounter for antineoplastic radiation therapy: Secondary | ICD-10-CM | POA: Diagnosis not present

## 2022-07-15 DIAGNOSIS — C9001 Multiple myeloma in remission: Secondary | ICD-10-CM

## 2022-07-15 LAB — RAD ONC ARIA SESSION SUMMARY

## 2022-07-15 NOTE — Telephone Encounter (Signed)
Oral Oncology Patient Advocate Encounter  Called to check status of benefits investigation for Pomalyst.  The representative for [employer] informed me that they had retried the benefits investigation with the [facility] address provided and were still unsuccessful. The program reached out to the patient's daughter [name] and was provided the zip code [phone] to try again.  I confirmed with the program that the address pulled through electronic eligibility in [facility] shows address: [address] [place], [place] [address]  I provided this address to the program and requested they reach out to me once they have attempted another benefits verification with this information.  Lady Deutscher, CPhT-Adv Oncology Pharmacy Patient [place] Direct Number: [phone]  Fax: [phone]

## 2022-07-16 ENCOUNTER — Other Ambulatory Visit: Payer: Self-pay

## 2022-07-16 ENCOUNTER — Ambulatory Visit
Admission: RE | Admit: 2022-07-16 | Discharge: 2022-07-16 | Disposition: A | Discharge status: Home / Self Care | Payer: Medicare Other | Source: Ambulatory Visit | Attending: Radiation Oncology | Admitting: Radiation Oncology

## 2022-07-16 DIAGNOSIS — Z5112 Encounter for antineoplastic immunotherapy: Secondary | ICD-10-CM | POA: Diagnosis not present

## 2022-07-16 DIAGNOSIS — C7801 Secondary malignant neoplasm of right lung: Secondary | ICD-10-CM | POA: Diagnosis not present

## 2022-07-16 DIAGNOSIS — C7951 Secondary malignant neoplasm of bone: Secondary | ICD-10-CM | POA: Diagnosis not present

## 2022-07-16 DIAGNOSIS — Z510 Encounter for antineoplastic radiation therapy: Secondary | ICD-10-CM | POA: Diagnosis not present

## 2022-07-16 DIAGNOSIS — C9000 Multiple myeloma not having achieved remission: Secondary | ICD-10-CM | POA: Diagnosis not present

## 2022-07-16 LAB — MULTIPLE MYELOMA PANEL, SERUM
Albumin SerPl Elph-Mcnc: 3.2 g/dL (ref 2.9–4.4)
Albumin/Glob SerPl: 0.8 (ref 0.7–1.7)
Alpha 1: 0.3 g/dL (ref 0.0–0.4)
Alpha2 Glob SerPl Elph-Mcnc: 1 g/dL (ref 0.4–1.0)
B-Globulin SerPl Elph-Mcnc: 2.4 g/dL — ABNORMAL HIGH (ref 0.7–1.3)
Gamma Glob SerPl Elph-Mcnc: 0.8 g/dL (ref 0.4–1.8)
Globulin, Total: 4.4 g/dL — ABNORMAL HIGH (ref 2.2–3.9)
IgA: 2207 mg/dL — ABNORMAL HIGH (ref 61–437)
IgG (Immunoglobin G), Serum: 409 mg/dL — ABNORMAL LOW (ref 603–1613)
IgM (Immunoglobulin M), Srm: 34 mg/dL (ref 15–143)
M Protein SerPl Elph-Mcnc: 1.5 g/dL — ABNORMAL HIGH
Total Protein ELP: 7.6 g/dL (ref 6.0–8.5)

## 2022-07-16 LAB — RAD ONC ARIA SESSION SUMMARY

## 2022-07-16 MED FILL — Dexamethasone Sodium Phosphate Inj 100 MG/10ML: INTRAMUSCULAR | Qty: 2 | Status: AC

## 2022-07-17 ENCOUNTER — Other Ambulatory Visit: Payer: Self-pay

## 2022-07-17 ENCOUNTER — Inpatient Hospital Stay: Payer: Medicare Other

## 2022-07-17 ENCOUNTER — Inpatient Hospital Stay (HOSPITAL_BASED_OUTPATIENT_CLINIC_OR_DEPARTMENT_OTHER): Payer: Medicare Other | Admitting: Hematology

## 2022-07-17 ENCOUNTER — Ambulatory Visit
Admission: RE | Admit: 2022-07-17 | Discharge: 2022-07-17 | Disposition: A | Discharge status: Home / Self Care | Payer: Medicare Other | Source: Ambulatory Visit | Attending: Radiation Oncology | Admitting: Radiation Oncology

## 2022-07-17 VITALS — BP 141/60 | HR 60 | Temp 98.6°F | Wt 194.8 lb

## 2022-07-17 DIAGNOSIS — C9002 Multiple myeloma in relapse: Secondary | ICD-10-CM | POA: Insufficient documentation

## 2022-07-17 DIAGNOSIS — H9191 Unspecified hearing loss, right ear: Secondary | ICD-10-CM | POA: Insufficient documentation

## 2022-07-17 DIAGNOSIS — R918 Other nonspecific abnormal finding of lung field: Secondary | ICD-10-CM | POA: Insufficient documentation

## 2022-07-17 DIAGNOSIS — C9000 Multiple myeloma not having achieved remission: Secondary | ICD-10-CM | POA: Diagnosis not present

## 2022-07-17 DIAGNOSIS — R531 Weakness: Secondary | ICD-10-CM | POA: Insufficient documentation

## 2022-07-17 DIAGNOSIS — C7951 Secondary malignant neoplasm of bone: Secondary | ICD-10-CM | POA: Diagnosis not present

## 2022-07-17 DIAGNOSIS — Z8041 Family history of malignant neoplasm of ovary: Secondary | ICD-10-CM | POA: Insufficient documentation

## 2022-07-17 DIAGNOSIS — Z87891 Personal history of nicotine dependence: Secondary | ICD-10-CM | POA: Insufficient documentation

## 2022-07-17 DIAGNOSIS — C9001 Multiple myeloma in remission: Secondary | ICD-10-CM

## 2022-07-17 DIAGNOSIS — Z5112 Encounter for antineoplastic immunotherapy: Secondary | ICD-10-CM

## 2022-07-17 DIAGNOSIS — C7801 Secondary malignant neoplasm of right lung: Secondary | ICD-10-CM | POA: Diagnosis not present

## 2022-07-17 DIAGNOSIS — K8020 Calculus of gallbladder without cholecystitis without obstruction: Secondary | ICD-10-CM | POA: Insufficient documentation

## 2022-07-17 DIAGNOSIS — Z510 Encounter for antineoplastic radiation therapy: Secondary | ICD-10-CM | POA: Diagnosis not present

## 2022-07-17 DIAGNOSIS — Z803 Family history of malignant neoplasm of breast: Secondary | ICD-10-CM | POA: Insufficient documentation

## 2022-07-17 DIAGNOSIS — Z7189 Other specified counseling: Secondary | ICD-10-CM

## 2022-07-17 LAB — RAD ONC ARIA SESSION SUMMARY

## 2022-07-17 LAB — CBC WITH DIFFERENTIAL (CANCER CENTER ONLY)
Abs Immature Granulocytes: 0.01 10*3/uL (ref 0.00–0.07)
Basophils Absolute: 0 10*3/uL (ref 0.0–0.1)
Basophils Relative: 0 %
Eosinophils Absolute: 0 10*3/uL (ref 0.0–0.5)
Eosinophils Relative: 0 %
HCT: 27 % — ABNORMAL LOW (ref 39.0–52.0)
Hemoglobin: 9.2 g/dL — ABNORMAL LOW (ref 13.0–17.0)
Immature Granulocytes: 0 %
Lymphocytes Relative: 6 %
Lymphs Abs: 0.2 10*3/uL — ABNORMAL LOW (ref 0.7–4.0)
MCH: 28.8 pg (ref 26.0–34.0)
MCHC: 34.1 g/dL (ref 30.0–36.0)
MCV: 84.6 fL (ref 80.0–100.0)
Monocytes Absolute: 0.3 10*3/uL (ref 0.1–1.0)
Monocytes Relative: 8 %
Neutro Abs: 2.8 10*3/uL (ref 1.7–7.7)
Neutrophils Relative %: 86 %
Platelet Count: 129 10*3/uL — ABNORMAL LOW (ref 150–400)
RBC: 3.19 MIL/uL — ABNORMAL LOW (ref 4.22–5.81)
RDW: 17 % — ABNORMAL HIGH (ref 11.5–15.5)
WBC Count: 3.3 10*3/uL — ABNORMAL LOW (ref 4.0–10.5)
nRBC: 0 % (ref 0.0–0.2)

## 2022-07-17 LAB — CMP (CANCER CENTER ONLY)
ALT: 22 U/L (ref 0–44)
AST: 21 U/L (ref 15–41)
Albumin: 3.6 g/dL (ref 3.5–5.0)
Alkaline Phosphatase: 71 U/L (ref 38–126)
Anion gap: 11 (ref 5–15)
BUN: 45 mg/dL — ABNORMAL HIGH (ref 8–23)
CO2: 26 mmol/L (ref 22–32)
Calcium: 10.8 mg/dL — ABNORMAL HIGH (ref 8.9–10.3)
Chloride: 99 mmol/L (ref 98–111)
Creatinine: 1.27 mg/dL — ABNORMAL HIGH (ref 0.61–1.24)
GFR, Estimated: 56 mL/min — ABNORMAL LOW (ref >60)
Glucose, Bld: 150 mg/dL — ABNORMAL HIGH (ref 70–99)
Potassium: 4.2 mmol/L (ref 3.5–5.1)
Sodium: 136 mmol/L (ref 135–145)
Total Bilirubin: 0.8 mg/dL (ref 0.3–1.2)
Total Protein: 8.2 g/dL — ABNORMAL HIGH (ref 6.5–8.1)

## 2022-07-17 MED ORDER — SODIUM CHLORIDE 0.9 % IV SOLN
10.0000 mg/kg | Freq: Once | INTRAVENOUS | Status: AC
  Administered 2022-07-17: 900 mg via INTRAVENOUS
  Filled 2022-07-17: qty 45
  Filled 2022-07-17: qty 25

## 2022-07-17 MED ORDER — MONTELUKAST SODIUM 10 MG PO TABS
10.0000 mg | ORAL_TABLET | Freq: Once | ORAL | Status: AC
  Administered 2022-07-17: 10 mg via ORAL
  Filled 2022-07-17: qty 1

## 2022-07-17 MED ORDER — FAMOTIDINE IN NACL 20-0.9 MG/50ML-% IV SOLN
20.0000 mg | Freq: Once | INTRAVENOUS | Status: AC
  Administered 2022-07-17: 20 mg via INTRAVENOUS
  Filled 2022-07-17: qty 50

## 2022-07-17 MED ORDER — SODIUM CHLORIDE 0.9 % IV SOLN
20.0000 mg | Freq: Once | INTRAVENOUS | Status: AC
  Administered 2022-07-17: 20 mg via INTRAVENOUS
  Filled 2022-07-17: qty 20

## 2022-07-17 MED ORDER — DIPHENHYDRAMINE HCL 50 MG/ML IJ SOLN
50.0000 mg | Freq: Once | INTRAMUSCULAR | Status: AC
  Administered 2022-07-17: 50 mg via INTRAVENOUS
  Filled 2022-07-17: qty 1

## 2022-07-17 MED ORDER — DENOSUMAB 120 MG/1.7ML ~~LOC~~ SOLN: 120.0000 mg | Freq: Once | SUBCUTANEOUS | Status: DC

## 2022-07-17 MED ORDER — SODIUM CHLORIDE 0.9 % IV SOLN: Freq: Once | INTRAVENOUS | Status: AC

## 2022-07-17 MED ORDER — ACETAMINOPHEN 325 MG PO TABS
650.0000 mg | ORAL_TABLET | Freq: Once | ORAL | Status: AC
  Administered 2022-07-17: 650 mg via ORAL
  Filled 2022-07-17: qty 2

## 2022-07-17 NOTE — Telephone Encounter (Signed)
Oral Oncology Patient Advocate Encounter  Called to check status of assistance application for Pomalyst through [employer].  Status is pending confirmation of insurance address by daughter, [name].   [employer] phone [phone]  I will continue to check status until final determination.  Lady Deutscher, CPhT-Adv Oncology Pharmacy Patient [place] Direct Number: [phone]  Fax: [phone]

## 2022-07-17 NOTE — Patient Instructions (Signed)
[facility]  Discharge Instructions: Thank you for choosing [facility] to provide your oncology and hematology care.   If you have a lab appointment with the [facility], please go directly to the [facility] and check in at the registration area.   Wear comfortable clothing and clothing appropriate for easy access to any Portacath or PICC line.   We strive to give you quality time with your provider. You may need to reschedule your appointment if you arrive late (15 or more minutes).  Arriving late affects you and other patients whose appointments are after yours.  Also, if you miss three or more appointments without notifying the office, you may be dismissed from the clinic at the provider's discretion.      For prescription refill requests, have your pharmacy contact our office and allow 72 hours for refills to be completed.    Today you received the following chemotherapy and/or immunotherapy agents :  Isatuxamab      To help prevent nausea and vomiting after your treatment, we encourage you to take your nausea medication as directed.  BELOW ARE SYMPTOMS THAT SHOULD BE REPORTED IMMEDIATELY: *FEVER GREATER THAN 100.4 F (38 C) OR HIGHER *CHILLS OR SWEATING *NAUSEA AND VOMITING THAT IS NOT CONTROLLED WITH YOUR NAUSEA MEDICATION *UNUSUAL SHORTNESS OF BREATH *UNUSUAL BRUISING OR BLEEDING *URINARY PROBLEMS (pain or burning when urinating, or frequent urination) *BOWEL PROBLEMS (unusual diarrhea, constipation, pain near the anus) TENDERNESS IN MOUTH AND THROAT WITH OR WITHOUT PRESENCE OF ULCERS (sore throat, sores in mouth, or a toothache) UNUSUAL RASH, SWELLING OR PAIN  UNUSUAL VAGINAL DISCHARGE OR ITCHING   Items with * indicate a potential emergency and should be followed up as soon as possible or go to the Emergency Department if any problems should occur.  Please show the CHEMOTHERAPY ALERT CARD or IMMUNOTHERAPY ALERT CARD at  check-in to the Emergency Department and triage nurse.  Should you have questions after your visit or need to cancel or reschedule your appointment, please contact [facility]  Dept: [phone]  and follow the prompts.  Office hours are 8:00 a.m. to 4:30 p.m. Monday - Friday. Please note that voicemails left after 4:00 p.m. may not be returned until the following business day.  We are closed weekends and major holidays. You have access to a nurse at all times for urgent questions. Please call the main number to the clinic Dept: [phone] and follow the prompts.   For any non-urgent questions, you may also contact your provider using MyChart. We now offer e-Visits for anyone 69 and older to request care online for non-urgent symptoms. For details visit [email or web address].   Also download the MyChart app! Go to the app store, search "MyChart", open the app, select [facility], and log in with your MyChart username and password.

## 2022-07-17 NOTE — Progress Notes (Signed)
HEMATOLOGY/ONCOLOGY CLINIC NOTE  Date of Service: 07/17/22   Patient Care Team: [name], MD as PCP - General (Family Medicine) [name], MD (Cardiology) [name], MD (Cardiology) [name], DO as Consulting Physician (Neurology)  CHIEF COMPLAINTS/PURPOSE OF CONSULTATION:  Follow-up for continued evaluation and management of multiple myeloma  HISTORY OF PRESENTING ILLNESS:  Please see previous note for details on initial presentation.  INTERVAL HISTORY:   [name] is a 87 y.o. male here for continued evaluation and management of multiple myeloma.  Patient was last seen by me on 06/12/2022 and complained of right LE weakness/pain, back weakness/pain,  and itchy lumps on his lower back and right shoulder. He also complained of right knee pain.  Today, he is here for cycle 1 day 15 of his Isatuximab/Pomolidimide/Dexamethasone treatment. He is accompanied by his wife.  He complains of a nodule on his upper forehead and his back.  He complains of bleeding and hearing loss in his right ear. There was some concern that there may be some wax build-up in his right ear.  He does complain of weakness/numbness in his bilateral legs. He is able to stand carefully and does regularly use a walker. He reports that he is able to lift his left leg, but not the right. He is also able to straighten out his left leg but has difficulty bending it at the knee.  He complains that his appetite has been low and consumes half as much food as usual. He denies any uncontrolled pain but does note pain during radiation. No specific tooth hurts at this time.  He reports that he was previously engaging in physical therapy until he had a fall three weeks ago. He does complain of some back discomfort following a previous X-ray. He is unsure of what he has been taking for pain relief.  In regards to his bowel movements, he uses Miralax as needed. He has no issues passing urine.     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/2008   PACEMAKER, PERMANENT 2005   Gen change 2014 Medtronic Adaptic L dual-chamber pacemaker, serial [id number]    SVT (supraventricular tachycardia)    TOBACCO ABUSE 10/25/2008   Quit 2012    SURGICAL HISTORY: Past Surgical History:  Procedure Laterality Date   COLONOSCOPY  2004,2009   PACEMAKER INSERTION  05/14/2003   PERMANENT PACEMAKER GENERATOR CHANGE N/A 06/19/2012   Procedure: PERMANENT PACEMAKER GENERATOR CHANGE;  Surgeon: [name], MD; Medtronic Adaptic L dual-chamber pacemaker, serial [id number]     SKIN GRAFT Right 05/13/1960   wrist   TOOTH EXTRACTION  11/10/2020   3 teeth removed    SOCIAL HISTORY: Social History   Socioeconomic History   Marital status: Divorced    Spouse name: Not on file   Number of children: 4   Years of education: Not on file   Highest education level: Not on file  Occupational History   Occupation: ENVIROMENTAL SERVICE    Employer: [employer]   Occupation: retired  Tobacco Use   Smoking status: Former    Packs/day:  0.25    Years: 46.00    Total pack years: 11.50    Types: Cigarettes    Start date: 03/16/1965    Quit date: 06/15/2010    Years since quitting: 12.0   Smokeless tobacco: Never   Tobacco comments:    Former smoker 10/11/21 quit 3 years ago  Vaping Use   Vaping Use: Never used  Substance and Sexual Activity   Alcohol use: Yes    Alcohol/week: 6.0 - 10.0 standard drinks of alcohol    Types: 6 - 10 Glasses of wine per week    Comment: 1-2 glasses every  weekend 10/11/21   Drug use: No   Sexual activity: Not on file  Other Topics Concern   Not on file  Social History Narrative   Has relocated from [place] in 2007. 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  Patient in a facility thru [facility]   Social Determinants of Health   Financial Resource Strain: Low Risk  (02/07/2022)   Overall Financial Resource Strain (CARDIA)    Difficulty of Paying Living Expenses: Not hard at all  Food Insecurity: No Food Insecurity (02/28/2022)   Hunger Vital Sign    Worried About Running Out of Food in the Last Year: Never true    Ran Out of Food in the Last Year: Never true  Transportation Needs: No Transportation Needs (02/07/2022)   PRAPARE - Hydrologist (Medical): No    Lack of Transportation (Non-Medical): No  Physical Activity: Sufficiently Active (02/07/2022)   Exercise Vital Sign    Days of Exercise per Week: 7 days    Minutes of Exercise per Session: 50 min  Stress: No Stress Concern Present (02/07/2022)   [place]    Feeling of Stress : Not at all  Social Connections: Socially Isolated (02/07/2022)   Social Connection and Isolation Panel [NHANES]    Frequency of Communication with Friends and Family: More than three times a week    Frequency of Social Gatherings with Friends and Family: More than three times a week    Attends Religious Services: Never    Marine scientist or Organizations: No    Attends Archivist Meetings: Never    Marital Status: Divorced  Human resources officer Violence: Not At Risk (02/07/2022)   Humiliation, Afraid, Rape, and Kick questionnaire    Fear of Current or Ex-Partner: No    Emotionally Abused: No    Physically Abused: No    Sexually Abused: No    FAMILY HISTORY: Family History  Problem Relation Age of Onset   Heart attack Father        Died, 25   Kidney disease Father    Parkinson's disease Mother        Died, 59    Breast cancer Sister        Living, 28   Diabetes type II Brother        Living, 59   Breast cancer Sister        Living, 25   Diabetes Daughter        Living, 61   Diabetes Son        Living, 2   Ovarian cancer Daughter    Colon cancer Neg Hx    Rectal cancer Neg Hx    Stomach cancer Neg Hx     ALLERGIES:  is allergic to lexapro [escitalopram oxalate].  MEDICATIONS:  Current Outpatient Medications  Medication Sig Dispense Refill  acyclovir (ZOVIRAX) 400 MG tablet Take 1 tablet (400 mg total) by mouth 2 (two) times daily. 60 tablet 11   apixaban (ELIQUIS) 5 MG TABS tablet Take 1 tablet (5 mg total) by mouth 2 (two) times daily. 180 tablet 1   colchicine 0.6 MG tablet Take 2 tabs (1.2 mg) now.  Then take 1 tab (0.6 mg) 1 hour later.  Then take 1 tab daily until gout flare resolves. 14 tablet 0   dexamethasone (DECADRON) 4 MG tablet Take 5 tablets ('20mg'$ ) every 2 weeks on days 8 and 22 starting cycle 2. Repeat every 28 days. 60 tablet 0   dexamethasone (DECADRON) 4 MG tablet Take one tablet by mouth twice daily until otherwise instructed by RADIATION providers 60 tablet 1   diltiazem (TIAZAC) 240 MG 24 hr capsule Take 1 capsule (240 mg total) by mouth daily. 90 capsule 3   flecainide (TAMBOCOR) 150 MG tablet Take 0.5 tablets (75 mg total) by mouth 2 (two) times daily. 90 tablet 3   furosemide (LASIX) 40 MG tablet Take 1 tablet (40 mg total) by mouth daily. 90 tablet 3   irbesartan (AVAPRO) 300 MG tablet Take 1 tablet (300 mg total) by mouth daily. Take 300 mg by mouth daily. 90 tablet 3   metoprolol tartrate (LOPRESSOR) 50 MG tablet Take 1 tablet (50 mg total) by mouth 2 (two) times daily. 180 tablet 3   ondansetron (ZOFRAN) 8 MG tablet Take 1 tablet (8 mg total) by mouth every 8 (eight) hours as needed for nausea or vomiting. 30 tablet 1   polyethylene glycol (MIRALAX / GLYCOLAX) 17 g packet Take 17 g by mouth daily. 100 each 3   pomalidomide (POMALYST) 2 MG capsule Take 1 capsule (2  mg total) by mouth daily. Take 21 days on, 7 days off, repeat every 28 days. 21 capsule 2   prochlorperazine (COMPAZINE) 10 MG tablet Take 1 tablet (10 mg total) by mouth every 6 (six) hours as needed for nausea or vomiting. 30 tablet 1   senna-docusate (SENOKOT-S) 8.6-50 MG tablet Take 2 tablets by mouth 2 (two) times daily as needed for mild constipation. 30 tablet 0   tamsulosin (FLOMAX) 0.4 MG CAPS capsule Take 2 capsules (0.8 mg total) by mouth daily. 90 capsule 3   traMADol (ULTRAM) 50 MG tablet Take 1 tablet (50 mg total) by mouth every 6 (six) hours as needed. 30 tablet 0   No current facility-administered medications for this visit.   Facility-Administered Medications Ordered in Other Visits  Medication Dose Route Frequency Provider Last Rate Last Admin   [MAR Hold] sodium chloride flush (NS) 0.9 % injection 10 mL  10 mL Intracatheter PRN Brunetta Genera, MD        REVIEW OF SYSTEMS:    10 Point review of Systems was done is negative except as noted above.   PHYSICAL EXAMINATION: ECOG PERFORMANCE STATUS: 1 - Symptomatic but completely ambulatory    GENERAL:alert, in no acute distress and comfortable SKIN: no acute rashes, no significant lesions EYES: conjunctiva are pink and non-injected, sclera anicteric OROPHARYNX: MMM, no exudates, no oropharyngeal erythema or ulceration NECK: supple, no JVD LYMPH:  no palpable lymphadenopathy in the cervical, axillary or inguinal regions LUNGS: clear to auscultation b/l with normal respiratory effort HEART: regular rate & rhythm ABDOMEN:  normoactive bowel sounds , non tender, not distended. Extremity: no pedal edema PSYCH: alert & oriented x 3 with fluent speech NEURO:rt leg/foot weakness  LABORATORY DATA:  I have reviewed the  data as listed .    Latest Ref Rng & Units 07/17/2022   11:26 AM 07/10/2022   11:47 AM 07/02/2022   10:20 AM  CBC  WBC 4.0 - 10.5 K/uL 3.3  4.2  2.9   Hemoglobin 13.0 - 17.0 g/dL 9.2  10.0  9.3    Hematocrit 39.0 - 52.0 % 27.0  30.0  28.1   Platelets 150 - 400 K/uL 129  216  166        Latest Ref Rng & Units 07/17/2022   11:26 AM 07/10/2022   11:47 AM 05/15/2022    1:51 PM  CMP  Glucose 70 - 99 mg/dL 150  93  102   BUN 8 - 23 mg/dL 45  11  15   Creatinine 0.61 - 1.24 mg/dL 1.27  1.19  1.25   Sodium 135 - 145 mmol/L 136  136  135   Potassium 3.5 - 5.1 mmol/L 4.2  3.7  4.0   Chloride 98 - 111 mmol/L 99  99  103   CO2 22 - 32 mmol/L '26  27  24   '$ Calcium 8.9 - 10.3 mg/dL 10.8  10.1  8.9   Total Protein 6.5 - 8.1 g/dL 8.2  7.5  6.6   Total Bilirubin 0.3 - 1.2 mg/dL 0.8  0.6  0.7   Alkaline Phos 38 - 126 U/L 71  93  119   AST 15 - 41 U/L '21  22  16   '$ ALT 0 - 44 U/L '22  10  9       '$ 04/17/18 BM Bx:    RADIOGRAPHIC STUDIES: I have personally reviewed the radiological images as listed and agreed with the findings in the report. CT Angio Chest Pulmonary Embolism (PE) W or WO Contrast  Result Date: 07/12/2022 CLINICAL DATA:  Multiple myeloma. Evidence of progressive metastatic disease on CT 07/11/2022 (cervical spine). Concern for pulmonary embolism in the LEFT upper lobe on comparison CT cervical spine. EXAM: CT ANGIOGRAPHY CHEST WITH CONTRAST TECHNIQUE: Multidetector CT imaging of the chest was performed using the standard protocol during bolus administration of intravenous contrast. Multiplanar CT image reconstructions and MIPs were obtained to evaluate the vascular anatomy. RADIATION DOSE REDUCTION: This exam was performed according to the departmental dose-optimization program which includes automated exposure control, adjustment of the mA and/or kV according to patient size and/or use of iterative reconstruction technique. CONTRAST:  59m OMNIPAQUE IOHEXOL 350 MG/ML SOLN COMPARISON:  CT cervical spine 07/11/2022, FDG PET scan 06/05/2022 FINDINGS: Cardiovascular: No filling defects within the pulmonary arteries to suggest acute pulmonary embolism. LEFT-sided pacer noted.  Mediastinum/Nodes: No mediastinal adenopathy. No axillary supraclavicular adenopathy. Lungs/Pleura: Multiple round pulmonary nodule metastasis primarily in the RIGHT upper lobe and lateral LEFT lung not changed from CT 1 day prior. Upper Abdomen: Limited view of the liver, kidneys, pancreas are unremarkable. Normal adrenal glands. Musculoskeletal: Soft tissue mass in the upper RIGHT back again noted. Extensive osseous metastasis erosion in the posterior RIGHT upper ribs. Multiple lytic lesions throughout the RIGHT shoulder girdle. Multiple lytic thoracic metastasis again noted. Single acute fracture of the lateral RIGHT seventh rib (image 42/4) Multiple cutaneous nodules scratch the several cutaneous nodules in the anterior chest wall. Review of the MIP images confirms the above findings. IMPRESSION: 1. No evidence acute pulmonary embolism. 2. Extensive pulmonary metastasis not changed from CT 1 day prior. 3. Extensive osseous metastasis with erosion of the posterior RIGHT upper ribs and RIGHT shoulder girdle. Multiple thoracic metastasis not changed. 4.  Single ACUTE FRACTURE of the lateral RIGHT seventh rib. 5. Soft tissue mass in the upper RIGHT back unchanged from recent CT or PET-CT scan. 6. Multiple cutaneous nodules in the anterior chest wall. Electronically Signed   By: Suzy Bouchard M.D.   On: 07/12/2022 11:42   CT LUMBAR SPINE W CONTRAST  Result Date: 07/11/2022 CLINICAL DATA:  Progressive myeloma EXAM: CT THORACIC SPINE WITH CONTRAST; CT LUMBAR SPINE WITH CONTRAST; CT CERVICAL SPINE WITH CONTRAST TECHNIQUE: Multidetector CT images of cervical, thoracic, and lumbar was performed according to the standard protocol following intravenous contrast administration. RADIATION DOSE REDUCTION: This exam was performed according to the departmental dose-optimization program which includes automated exposure control, adjustment of the mA and/or kV according to patient size and/or use of iterative reconstruction  technique. CONTRAST:  175m OMNIPAQUE IOHEXOL 300 MG/ML  SOLN COMPARISON:  PET CT 06/05/22 FINDINGS: Alignment: Normal. Vertebrae: Cervical spine: There are multiple lytic lesions throughout the cervical spine. There is no evidence of a pathologic fracture. No definite evidence of epidural spread of tumor, but note that CT is markedly limited assess for subtle epidural extension of tumor. Likely at least moderate spinal canal stenosis at C5-C6. Thoracic spine: There are innumerable lytic lesions throughout the thoracic spine involving nearly every thoracic vertebral body. There is no evidence of a pathologic fracture. There is likely evidence of epidural extension of tumor at the T1 vertebral body level (series 2, image 68). Lumbar spine: Redemonstrated is heterogeneous appearance of the bilateral femoral heads and lucent appearance of the bilateral acetabula. These regions did not demonstrate FDG uptake on recent prior PET-CT. Redemonstrated are destructive lesions in the sacrum bilaterally (series 2, image 96), unchanged from recent prior PET-CT. There is a fracture through the right L5 transverse process (series 2, image 76). This is age indeterminate, but likely new compared to 06/05/2022. The lesion along the right aspect obliterates the right L5-S1 neural foramen. There are multiple lytic lesions throughout the lumbar spine involving nearly every lumbar vertebral body level, as seen on recent prior PET-CT. There appears to be abnormal soft tissue within the ventral portion of the spinal canal at the L4-L5 level. This is nonspecific and could represent disc material. There are innumerable soft tissue density nodules in the subcutaneous soft tissues overlying the lumbar spine (series 3, image 44-114). These regions demonstrated FDG uptake on recent prior PET-CT. Paraspinal and other soft tissues: Heterogeneous and multinodular thyroid gland. There is marked interval increase in size of the soft tissue density  mass along the right subscapularis muscle body which now also extends along the pleura. This lesion also involves the right scapula. Compared to prior exam there is increased superior extension of the mass which now extends to involve the lower paraspinal musculature of the neck. Redemonstrated are extensive bilateral solid pulmonary nodules which have also increased in size, particularly in the left lung. There is a low-density filling defect in the left upper lobe lobar pulmonary artery (series 3, image 54) suspicious for a PE. Compared to prior exam there is a new low density lesions in the right hepatic lobe, which is worrisome for new hepatic disease. There is extensive soft tissue nodularity in the peritoneum in the retroperitoneum, that has progressed compared to prior PET-CT, particularly in the region of the right iliopsoas muscle (series 3, image 80). There is redemonstration of soft tissue nodularity along the left psoas and iliacus muscle. There is new retroperitoneal nodularity along the right ureter (series 3, image 50). There is interval  increase in size of a soft tissue nodule adjacent to the pancreatic tail which now measures up to 1.1 x 1.6 cm, previously 0.8 x 1.0 cm. There is cholelithiasis without cholecystitis. Compared to prior exam there is apparent increased circumferential bladder wall thickening, which may be secondary to differences in technique, but evaluation with urinalysis is recommended to exclude the possibility of superimposed infection. IMPRESSION: Extensive osseous and extra osseous myeloma, breast compared to recent prior PET-CT dated 06/05/2022. 1. New low-density filling defect in the left upper lobe lobar pulmonary artery, suspicious for PE. Recommend further evaluation with dedicated CT PE protocol study. 2. Innumerable lytic lesions throughout the cervical, thoracic and lumbar spine, as well as the sacrum as seen on recent prior PET-CT. There is likely evidence of epidural  extension of tumor at the T1 vertebral body level. No evidence of pathologic fracture. The lesion along the right aspect of the sacrum obliterates the right L5-S1 neural foramen. 3. There is a fracture through the right L5 transverse process, which is age indeterminate, but likely new compared to 06/05/2022. 4. There is marked interval increase in size of the soft tissue density mass along the right subscapularis muscle body which now extends to involve the right scapula. There is increased superior extension of the mass which now extends to involve the lower paraspinal musculature of the neck. 5. Compared to prior PET-CT there has been interval increase in size of extensive bilateral solid pulmonary nodules, particularly in the left lung. 6. There is a new low density lesions in the right hepatic. These results will be called to the ordering clinician or representative by the Radiologist Assistant, and communication documented in the PACS or [employer]. Electronically Signed   By: Marin Roberts M.D.   On: 07/11/2022 16:47   CT THORACIC SPINE W CONTRAST  Result Date: 07/11/2022 CLINICAL DATA:  Progressive myeloma EXAM: CT THORACIC SPINE WITH CONTRAST; CT LUMBAR SPINE WITH CONTRAST; CT CERVICAL SPINE WITH CONTRAST TECHNIQUE: Multidetector CT images of cervical, thoracic, and lumbar was performed according to the standard protocol following intravenous contrast administration. RADIATION DOSE REDUCTION: This exam was performed according to the departmental dose-optimization program which includes automated exposure control, adjustment of the mA and/or kV according to patient size and/or use of iterative reconstruction technique. CONTRAST:  19m OMNIPAQUE IOHEXOL 300 MG/ML  SOLN COMPARISON:  PET CT 06/05/22 FINDINGS: Alignment: Normal. Vertebrae: Cervical spine: There are multiple lytic lesions throughout the cervical spine. There is no evidence of a pathologic fracture. No definite evidence of epidural spread  of tumor, but note that CT is markedly limited assess for subtle epidural extension of tumor. Likely at least moderate spinal canal stenosis at C5-C6. Thoracic spine: There are innumerable lytic lesions throughout the thoracic spine involving nearly every thoracic vertebral body. There is no evidence of a pathologic fracture. There is likely evidence of epidural extension of tumor at the T1 vertebral body level (series 2, image 68). Lumbar spine: Redemonstrated is heterogeneous appearance of the bilateral femoral heads and lucent appearance of the bilateral acetabula. These regions did not demonstrate FDG uptake on recent prior PET-CT. Redemonstrated are destructive lesions in the sacrum bilaterally (series 2, image 96), unchanged from recent prior PET-CT. There is a fracture through the right L5 transverse process (series 2, image 76). This is age indeterminate, but likely new compared to 06/05/2022. The lesion along the right aspect obliterates the right L5-S1 neural foramen. There are multiple lytic lesions throughout the lumbar spine involving nearly every lumbar vertebral  body level, as seen on recent prior PET-CT. There appears to be abnormal soft tissue within the ventral portion of the spinal canal at the L4-L5 level. This is nonspecific and could represent disc material. There are innumerable soft tissue density nodules in the subcutaneous soft tissues overlying the lumbar spine (series 3, image 44-114). These regions demonstrated FDG uptake on recent prior PET-CT. Paraspinal and other soft tissues: Heterogeneous and multinodular thyroid gland. There is marked interval increase in size of the soft tissue density mass along the right subscapularis muscle body which now also extends along the pleura. This lesion also involves the right scapula. Compared to prior exam there is increased superior extension of the mass which now extends to involve the lower paraspinal musculature of the neck. Redemonstrated are  extensive bilateral solid pulmonary nodules which have also increased in size, particularly in the left lung. There is a low-density filling defect in the left upper lobe lobar pulmonary artery (series 3, image 54) suspicious for a PE. Compared to prior exam there is a new low density lesions in the right hepatic lobe, which is worrisome for new hepatic disease. There is extensive soft tissue nodularity in the peritoneum in the retroperitoneum, that has progressed compared to prior PET-CT, particularly in the region of the right iliopsoas muscle (series 3, image 80). There is redemonstration of soft tissue nodularity along the left psoas and iliacus muscle. There is new retroperitoneal nodularity along the right ureter (series 3, image 50). There is interval increase in size of a soft tissue nodule adjacent to the pancreatic tail which now measures up to 1.1 x 1.6 cm, previously 0.8 x 1.0 cm. There is cholelithiasis without cholecystitis. Compared to prior exam there is apparent increased circumferential bladder wall thickening, which may be secondary to differences in technique, but evaluation with urinalysis is recommended to exclude the possibility of superimposed infection. IMPRESSION: Extensive osseous and extra osseous myeloma, breast compared to recent prior PET-CT dated 06/05/2022. 1. New low-density filling defect in the left upper lobe lobar pulmonary artery, suspicious for PE. Recommend further evaluation with dedicated CT PE protocol study. 2. Innumerable lytic lesions throughout the cervical, thoracic and lumbar spine, as well as the sacrum as seen on recent prior PET-CT. There is likely evidence of epidural extension of tumor at the T1 vertebral body level. No evidence of pathologic fracture. The lesion along the right aspect of the sacrum obliterates the right L5-S1 neural foramen. 3. There is a fracture through the right L5 transverse process, which is age indeterminate, but likely new compared to  06/05/2022. 4. There is marked interval increase in size of the soft tissue density mass along the right subscapularis muscle body which now extends to involve the right scapula. There is increased superior extension of the mass which now extends to involve the lower paraspinal musculature of the neck. 5. Compared to prior PET-CT there has been interval increase in size of extensive bilateral solid pulmonary nodules, particularly in the left lung. 6. There is a new low density lesions in the right hepatic. These results will be called to the ordering clinician or representative by the Radiologist Assistant, and communication documented in the PACS or [employer]. Electronically Signed   By: Marin Roberts M.D.   On: 07/11/2022 16:47   CT Cervical Spine W Contrast  Result Date: 07/11/2022 CLINICAL DATA:  Progressive myeloma EXAM: CT THORACIC SPINE WITH CONTRAST; CT LUMBAR SPINE WITH CONTRAST; CT CERVICAL SPINE WITH CONTRAST TECHNIQUE: Multidetector CT images of cervical,  thoracic, and lumbar was performed according to the standard protocol following intravenous contrast administration. RADIATION DOSE REDUCTION: This exam was performed according to the departmental dose-optimization program which includes automated exposure control, adjustment of the mA and/or kV according to patient size and/or use of iterative reconstruction technique. CONTRAST:  122m OMNIPAQUE IOHEXOL 300 MG/ML  SOLN COMPARISON:  PET CT 06/05/22 FINDINGS: Alignment: Normal. Vertebrae: Cervical spine: There are multiple lytic lesions throughout the cervical spine. There is no evidence of a pathologic fracture. No definite evidence of epidural spread of tumor, but note that CT is markedly limited assess for subtle epidural extension of tumor. Likely at least moderate spinal canal stenosis at C5-C6. Thoracic spine: There are innumerable lytic lesions throughout the thoracic spine involving nearly every thoracic vertebral body. There is no  evidence of a pathologic fracture. There is likely evidence of epidural extension of tumor at the T1 vertebral body level (series 2, image 68). Lumbar spine: Redemonstrated is heterogeneous appearance of the bilateral femoral heads and lucent appearance of the bilateral acetabula. These regions did not demonstrate FDG uptake on recent prior PET-CT. Redemonstrated are destructive lesions in the sacrum bilaterally (series 2, image 96), unchanged from recent prior PET-CT. There is a fracture through the right L5 transverse process (series 2, image 76). This is age indeterminate, but likely new compared to 06/05/2022. The lesion along the right aspect obliterates the right L5-S1 neural foramen. There are multiple lytic lesions throughout the lumbar spine involving nearly every lumbar vertebral body level, as seen on recent prior PET-CT. There appears to be abnormal soft tissue within the ventral portion of the spinal canal at the L4-L5 level. This is nonspecific and could represent disc material. There are innumerable soft tissue density nodules in the subcutaneous soft tissues overlying the lumbar spine (series 3, image 44-114). These regions demonstrated FDG uptake on recent prior PET-CT. Paraspinal and other soft tissues: Heterogeneous and multinodular thyroid gland. There is marked interval increase in size of the soft tissue density mass along the right subscapularis muscle body which now also extends along the pleura. This lesion also involves the right scapula. Compared to prior exam there is increased superior extension of the mass which now extends to involve the lower paraspinal musculature of the neck. Redemonstrated are extensive bilateral solid pulmonary nodules which have also increased in size, particularly in the left lung. There is a low-density filling defect in the left upper lobe lobar pulmonary artery (series 3, image 54) suspicious for a PE. Compared to prior exam there is a new low density lesions  in the right hepatic lobe, which is worrisome for new hepatic disease. There is extensive soft tissue nodularity in the peritoneum in the retroperitoneum, that has progressed compared to prior PET-CT, particularly in the region of the right iliopsoas muscle (series 3, image 80). There is redemonstration of soft tissue nodularity along the left psoas and iliacus muscle. There is new retroperitoneal nodularity along the right ureter (series 3, image 50). There is interval increase in size of a soft tissue nodule adjacent to the pancreatic tail which now measures up to 1.1 x 1.6 cm, previously 0.8 x 1.0 cm. There is cholelithiasis without cholecystitis. Compared to prior exam there is apparent increased circumferential bladder wall thickening, which may be secondary to differences in technique, but evaluation with urinalysis is recommended to exclude the possibility of superimposed infection. IMPRESSION: Extensive osseous and extra osseous myeloma, breast compared to recent prior PET-CT dated 06/05/2022. 1. New low-density filling defect in  the left upper lobe lobar pulmonary artery, suspicious for PE. Recommend further evaluation with dedicated CT PE protocol study. 2. Innumerable lytic lesions throughout the cervical, thoracic and lumbar spine, as well as the sacrum as seen on recent prior PET-CT. There is likely evidence of epidural extension of tumor at the T1 vertebral body level. No evidence of pathologic fracture. The lesion along the right aspect of the sacrum obliterates the right L5-S1 neural foramen. 3. There is a fracture through the right L5 transverse process, which is age indeterminate, but likely new compared to 06/05/2022. 4. There is marked interval increase in size of the soft tissue density mass along the right subscapularis muscle body which now extends to involve the right scapula. There is increased superior extension of the mass which now extends to involve the lower paraspinal musculature of  the neck. 5. Compared to prior PET-CT there has been interval increase in size of extensive bilateral solid pulmonary nodules, particularly in the left lung. 6. There is a new low density lesions in the right hepatic. These results will be called to the ordering clinician or representative by the Radiologist Assistant, and communication documented in the PACS or [employer]. Electronically Signed   By: Marin Roberts M.D.   On: 07/11/2022 16:47    ASSESSMENT & PLAN:   87 y.o. male with  1.  Relapsed multiple myeloma multiple bone metastases   labs upon initial presentation from 04/14/18; hypercalcemic with Calcium at 13.8, renal function up form baseline with Creatinine at 1.48, HGB at 11.3. PSA was normal.   03/31/18 CT C/A/P revealed 1. Lytic lesion throughout all visualized vertebra from the lower cervical spine through the sacrum. Epidural extension of tumor T2, T3 and L2 level. Right L3-4 foraminal extension tumor. 2. Lytic lesions involving majority of ribs. Multiple pathologic rib fractures with bony expansion/sub pleural extension of tumor at several levels. 3. Lytic lesions of the scapula, hips bilaterally and pelvis bilaterally. With further progression of tumor, patient may be at risk for pathologic fractures. Pathologic fracture right clavicle incompletely assessed. 4. Circumferential narrowing sigmoid colon. Question possibility of underlying mass (series 2, image 97). Alternatively this could represent muscular hypertrophy/peristalsis. 5. Gastric antrum circumferential narrowing. Cannot exclude mass although this may be related to peristalsis. 6. No primary lung malignancy noted. 7. Matted aortic pulmonary window lymph nodes. 8. Circumferential urinary bladder wall thickening greater anterior dome region. Right lateral bladder diverticulum. 9. 5 mm low-density lesion pancreatic body (series 5, image 10) unchanged. This is felt unlikely to be a primary malignancy. 10. Right parapelvic 2.2  cm cyst without significant change. Scattered low-density renal lesions bilaterally too small to adequately characterize. Some were present previously. Statistically these are likely cysts. 11. Stable adrenal gland hyperplasia. 12. Gynecomastia. 13. Prostate gland causes slight impression upon the bladder base. 14.  Aortic Atherosclerosis. 15. Sequential pacemaker in place. 16. Gallstones.   04/23/18 PET/CT revealed Innumerable hypermetabolic lytic lesions throughout the skeleton especially involving the spine, ribs, bony pelvis along with some involvement of both proximal femurs. The appearance is compatible with pathologic diagnosis of plasma cell neoplasm could well reflect multiple myeloma. Resulting pathologic fractures of the right lateral clavicle and of multiple bilateral ribs. Lesions are present along the cortical margins of the spinal canal. 2. Both the area and the stomach in the area in the sigmoid colon drawn attention to on prior CT scan appear benign normal today. 3. The confluence of AP window lymph nodes measures about 1.2 cm in short  axis with maximum SUV 3.1, which is mildly above the blood pool merit surveillance. 4. Other imaging findings of potential clinical significance: Aortic Atherosclerosis. Coronary atherosclerosis. Pacemaker noted. Borderline prostatomegaly. Cholelithiasis.  04/21/18 BM Bx revealed Normocellular bone marrow with Plasma Cell neoplasm. Scattered medium sized clusters of kappa-restricted plasma cells (14% aspirate, 10% CD138 immunohistochemistry.   I do suspect that the burden of disease is underestimated in the bone marrow sample given PET/CT findings of innumerable lytic lesions and an M spike of 3.4g   06/08/18 DG Hips bilat which revealed Innumerable lucencies are noted throughout the pelvis and proximal femurs, similar findings noted on prior PET-CT of 04/23/2018. Findings are consistent patient's known multiple myeloma. Femoral necks are intact. 2.  Aortoiliac  atherosclerotic vascular disease.  08/06/2021 PET/CT revealed "1. Interval development of consolidative masslike opacities in both upper lobes demonstrating marked hypermetabolism. While these may be infectious/inflammatory, FDG accumulation is higher than typically seen in that setting. Metastatic disease not excluded. 2. Dominant left rib, right scapula, and left sacral lesions all show marked interval decrease in size and hypermetabolism. 3. Soft tissue nodule in the midline back adjacent to the L4 spinous process is similar in size and hypermetabolism today. 4. Interval decrease in size and hypermetabolism in the gastrohepatic lymph node identified previously. 5. Multiple scattered osseous lesions compatible with treated multiple myeloma. 6. Cholelithiasis."   2.  Pulmonary infiltrates  --Likely related to radiation related change.  PLAN:     -Discussed lab results on 07/17/2022 in detail with patient. CBC showed WBC of 3.3K, hemoglobin of 9.2, and platelets of 129K. -mild anemia, no blood transfusion needed at this time -CMP reveals mild hypercalcemia, calcium level 10.8, will restart Xgeva to lower calcium levels -no immediate plans for dental work at this time -recommend patient to stay hydrated to keep extra calcium flushed out -will order pain medication for patient to take prior to his radiation therapy -Discussed results of 06/05/2022 PET/CT scan which revealed broad progression of his multiple myeloma  -Recommend patient to have ear wax cleaned to improve hearing loss in right ear -LE weakness likely due to pinched nerve in back due to his sacral lesion -radiation oncology is helping with palliative RT treat upper and lower back masses with radiation therapy  -no significant toxicities to Isatuximab and we shall continue this. He will start the pomalidomide as soon as he receives this.  FOLLOW-UP: Per integrated scheduling   The total time spent in the appointment was  32 minutes* .  All of the patient's questions were answered with apparent satisfaction. The patient knows to call the clinic with any problems, questions or concerns.   Sullivan Lone MD MS AAHIVMS [facility] Hematology/Oncology Physician [facility]  .*Total Encounter Time as defined by the Centers for Medicare and Medicaid Services includes, in addition to the face-to-face time of a patient visit (documented in the note above) non-face-to-face time: obtaining and reviewing outside history, ordering and reviewing medications, tests or procedures, care coordination (communications with other health care professionals or caregivers) and documentation in the medical record.   I,Mitra Faeizi,acting as a Education administrator for Sullivan Lone, MD.,have documented all relevant documentation on the behalf of Sullivan Lone, MD,as directed by  Sullivan Lone, MD while in the presence of Sullivan Lone, MD.  .I have reviewed the above documentation for accuracy and completeness, and I agree with the above. Brunetta Genera MD

## 2022-07-18 ENCOUNTER — Ambulatory Visit
Admission: RE | Admit: 2022-07-18 | Discharge: 2022-07-18 | Disposition: A | Discharge status: Home / Self Care | Payer: Medicare Other | Source: Ambulatory Visit | Attending: Radiation Oncology | Admitting: Radiation Oncology

## 2022-07-18 ENCOUNTER — Inpatient Hospital Stay: Payer: Medicare Other

## 2022-07-18 ENCOUNTER — Other Ambulatory Visit (HOSPITAL_COMMUNITY): Payer: Self-pay

## 2022-07-18 ENCOUNTER — Other Ambulatory Visit: Payer: Self-pay

## 2022-07-18 VITALS — BP 140/71 | HR 68 | Temp 98.5°F

## 2022-07-18 DIAGNOSIS — C9000 Multiple myeloma not having achieved remission: Secondary | ICD-10-CM | POA: Diagnosis not present

## 2022-07-18 DIAGNOSIS — C7951 Secondary malignant neoplasm of bone: Secondary | ICD-10-CM

## 2022-07-18 DIAGNOSIS — Z5112 Encounter for antineoplastic immunotherapy: Secondary | ICD-10-CM | POA: Diagnosis not present

## 2022-07-18 DIAGNOSIS — C7801 Secondary malignant neoplasm of right lung: Secondary | ICD-10-CM | POA: Diagnosis not present

## 2022-07-18 DIAGNOSIS — Z7189 Other specified counseling: Secondary | ICD-10-CM

## 2022-07-18 DIAGNOSIS — Z510 Encounter for antineoplastic radiation therapy: Secondary | ICD-10-CM | POA: Diagnosis not present

## 2022-07-18 LAB — RAD ONC ARIA SESSION SUMMARY

## 2022-07-18 MED ORDER — DENOSUMAB 120 MG/1.7ML ~~LOC~~ SOLN
120.0000 mg | Freq: Once | SUBCUTANEOUS | Status: AC
  Administered 2022-07-18: 120 mg via SUBCUTANEOUS
  Filled 2022-07-18: qty 1.7

## 2022-07-18 NOTE — Telephone Encounter (Signed)
Oral Oncology Patient Advocate Encounter  Called and spoke with patient's daughter, sybil, regarding verification of patient's address with insurance.  [name] confirmed her understanding that she must call and have the insurance provider confirm which address is being used for the patient. I provided her with the insurance ID for [place], as well as the number to call [place] Patient [place] Direct Number: [phone]  Fax: [phone]

## 2022-07-18 NOTE — Patient Instructions (Signed)
Denosumab Injection (Oncology) What is this medication? DENOSUMAB (den oh SUE mab) prevents weakened bones caused by cancer. It may also be used to treat noncancerous bone tumors that cannot be removed by surgery. It can also be used to treat high calcium levels in the blood caused by cancer. It works by blocking a protein that causes bones to break down quickly. This slows down the release of calcium from bones, which lowers calcium levels in your blood. It also makes your bones stronger and less likely to break (fracture). This medicine may be used for other purposes; ask your health care provider or pharmacist if you have questions. COMMON BRAND NAME(S): XGEVA What should I tell my care team before I take this medication? They need to know if you have any of these conditions: Dental disease Having surgery or tooth extraction Infection Kidney disease Low levels of calcium or vitamin D in the blood Malnutrition On hemodialysis Skin conditions or sensitivity Thyroid or parathyroid disease An unusual reaction to denosumab, other medications, foods, dyes, or preservatives Pregnant or trying to get pregnant Breast-feeding How should I use this medication? This medication is for injection under the skin. It is given by your care team in a hospital or clinic setting. A special MedGuide will be given to you before each treatment. Be sure to read this information carefully each time. Talk to your care team about the use of this medication in children. While it may be prescribed for children as young as 13 years for selected conditions, precautions do apply. Overdosage: If you think you have taken too much of this medicine contact a poison control center or emergency room at once. NOTE: This medicine is only for you. Do not share this medicine with others. What if I miss a dose? Keep appointments for follow-up doses. It is important not to miss your dose. Call your care team if you are unable to  keep an appointment. What may interact with this medication? Do not take this medication with any of the following: Other medications containing denosumab This medication may also interact with the following: Medications that lower your chance of fighting infection Steroid medications, such as prednisone or cortisone This list may not describe all possible interactions. Give your health care provider a list of all the medicines, herbs, non-prescription drugs, or dietary supplements you use. Also tell them if you smoke, drink alcohol, or use illegal drugs. Some items may interact with your medicine. What should I watch for while using this medication? Your condition will be monitored carefully while you are receiving this medication. You may need blood work while taking this medication. This medication may increase your risk of getting an infection. Call your care team for advice if you get a fever, chills, sore throat, or other symptoms of a cold or flu. Do not treat yourself. Try to avoid being around people who are sick. You should make sure you get enough calcium and vitamin D while you are taking this medication, unless your care team tells you not to. Discuss the foods you eat and the vitamins you take with your care team. Some people who take this medication have severe bone, joint, or muscle pain. This medication may also increase your risk for jaw problems or a broken thigh bone. Tell your care team right away if you have severe pain in your jaw, bones, joints, or muscles. Tell your care team if you have any pain that does not go away or that gets worse. Talk   to your care team if you may be pregnant. Serious birth defects can occur if you take this medication during pregnancy and for 5 months after the last dose. You will need a negative pregnancy test before starting this medication. Contraception is recommended while taking this medication and for 5 months after the last dose. Your care team  can help you find the option that works for you. What side effects may I notice from receiving this medication? Side effects that you should report to your care team as soon as possible: Allergic reactions--skin rash, itching, hives, swelling of the face, lips, tongue, or throat Bone, joint, or muscle pain Low calcium level--muscle pain or cramps, confusion, tingling, or numbness in the hands or feet Osteonecrosis of the jaw--pain, swelling, or redness in the mouth, numbness of the jaw, poor healing after dental work, unusual discharge from the mouth, visible bones in the mouth Side effects that usually do not require medical attention (report to your care team if they continue or are bothersome): Cough Diarrhea Fatigue Headache Nausea This list may not describe all possible side effects. Call your doctor for medical advice about side effects. You may report side effects to FDA at 1-800-FDA-1088. Where should I keep my medication? This medication is given in a hospital or clinic. It will not be stored at home. NOTE: This sheet is a summary. It may not cover all possible information. If you have questions about this medicine, talk to your doctor, pharmacist, or health care provider.  2023 Elsevier/Gold Standard (2021-09-17 00:00:00)  

## 2022-07-19 ENCOUNTER — Other Ambulatory Visit: Payer: Self-pay

## 2022-07-19 ENCOUNTER — Other Ambulatory Visit (HOSPITAL_COMMUNITY): Payer: Self-pay

## 2022-07-19 ENCOUNTER — Ambulatory Visit
Admission: RE | Admit: 2022-07-19 | Discharge: 2022-07-19 | Disposition: A | Discharge status: Home / Self Care | Payer: Medicare Other | Source: Ambulatory Visit | Attending: Radiation Oncology | Admitting: Radiation Oncology

## 2022-07-19 DIAGNOSIS — C7801 Secondary malignant neoplasm of right lung: Secondary | ICD-10-CM | POA: Diagnosis not present

## 2022-07-19 DIAGNOSIS — Z510 Encounter for antineoplastic radiation therapy: Secondary | ICD-10-CM | POA: Diagnosis not present

## 2022-07-19 DIAGNOSIS — C7951 Secondary malignant neoplasm of bone: Secondary | ICD-10-CM | POA: Diagnosis not present

## 2022-07-19 DIAGNOSIS — Z5112 Encounter for antineoplastic immunotherapy: Secondary | ICD-10-CM | POA: Diagnosis not present

## 2022-07-19 DIAGNOSIS — C9000 Multiple myeloma not having achieved remission: Secondary | ICD-10-CM | POA: Diagnosis not present

## 2022-07-19 LAB — RAD ONC ARIA SESSION SUMMARY
Course Elapsed Days: 4
Plan Fractions Treated to Date: 5
Plan Fractions Treated to Date: 5
Plan Prescribed Dose Per Fraction: 3 Gy
Plan Prescribed Dose Per Fraction: 3 Gy
Plan Total Fractions Prescribed: 10
Plan Total Fractions Prescribed: 10
Plan Total Prescribed Dose: 30 Gy
Plan Total Prescribed Dose: 30 Gy
Reference Point Dosage Given to Date: 15 Gy
Reference Point Dosage Given to Date: 15 Gy
Reference Point Session Dosage Given: 3 Gy
Reference Point Session Dosage Given: 3 Gy
Session Number: 5

## 2022-07-22 ENCOUNTER — Ambulatory Visit
Admission: RE | Admit: 2022-07-22 | Discharge: 2022-07-22 | Disposition: A | Discharge status: Home / Self Care | Payer: Medicare Other | Source: Ambulatory Visit | Attending: Radiation Oncology | Admitting: Radiation Oncology

## 2022-07-22 ENCOUNTER — Other Ambulatory Visit: Payer: Self-pay

## 2022-07-22 DIAGNOSIS — C7951 Secondary malignant neoplasm of bone: Secondary | ICD-10-CM | POA: Diagnosis not present

## 2022-07-22 DIAGNOSIS — C9000 Multiple myeloma not having achieved remission: Secondary | ICD-10-CM | POA: Diagnosis not present

## 2022-07-22 DIAGNOSIS — N3000 Acute cystitis without hematuria: Secondary | ICD-10-CM | POA: Diagnosis not present

## 2022-07-22 DIAGNOSIS — C7801 Secondary malignant neoplasm of right lung: Secondary | ICD-10-CM | POA: Diagnosis not present

## 2022-07-22 DIAGNOSIS — Z510 Encounter for antineoplastic radiation therapy: Secondary | ICD-10-CM | POA: Diagnosis not present

## 2022-07-22 DIAGNOSIS — Z5112 Encounter for antineoplastic immunotherapy: Secondary | ICD-10-CM | POA: Diagnosis not present

## 2022-07-22 LAB — RAD ONC ARIA SESSION SUMMARY
Course Elapsed Days: 7
Plan Fractions Treated to Date: 6
Plan Fractions Treated to Date: 6
Plan Prescribed Dose Per Fraction: 3 Gy
Plan Prescribed Dose Per Fraction: 3 Gy
Plan Total Fractions Prescribed: 10
Plan Total Fractions Prescribed: 10
Plan Total Prescribed Dose: 30 Gy
Plan Total Prescribed Dose: 30 Gy
Reference Point Dosage Given to Date: 18 Gy
Reference Point Dosage Given to Date: 18 Gy
Reference Point Session Dosage Given: 3 Gy
Reference Point Session Dosage Given: 3 Gy
Session Number: 6

## 2022-07-23 ENCOUNTER — Other Ambulatory Visit: Payer: Self-pay

## 2022-07-23 ENCOUNTER — Encounter: Payer: Self-pay | Admitting: Family Medicine

## 2022-07-23 ENCOUNTER — Ambulatory Visit
Admission: RE | Admit: 2022-07-23 | Discharge: 2022-07-23 | Disposition: A | Discharge status: Home / Self Care | Payer: Medicare Other | Source: Ambulatory Visit | Attending: Radiation Oncology | Admitting: Radiation Oncology

## 2022-07-23 ENCOUNTER — Ambulatory Visit (INDEPENDENT_AMBULATORY_CARE_PROVIDER_SITE_OTHER): Payer: Medicare Other | Admitting: Family Medicine

## 2022-07-23 ENCOUNTER — Encounter: Payer: Self-pay | Admitting: Hematology

## 2022-07-23 ENCOUNTER — Ambulatory Visit: Payer: Medicare Other | Admitting: Family Medicine

## 2022-07-23 VITALS — BP 110/60 | HR 63 | Temp 98.1°F | Wt 181.0 lb

## 2022-07-23 DIAGNOSIS — C7951 Secondary malignant neoplasm of bone: Secondary | ICD-10-CM | POA: Diagnosis not present

## 2022-07-23 DIAGNOSIS — Z5112 Encounter for antineoplastic immunotherapy: Secondary | ICD-10-CM | POA: Diagnosis not present

## 2022-07-23 DIAGNOSIS — C9000 Multiple myeloma not having achieved remission: Secondary | ICD-10-CM | POA: Diagnosis not present

## 2022-07-23 DIAGNOSIS — L89301 Pressure ulcer of unspecified buttock, stage 1: Secondary | ICD-10-CM | POA: Diagnosis not present

## 2022-07-23 DIAGNOSIS — Z510 Encounter for antineoplastic radiation therapy: Secondary | ICD-10-CM | POA: Diagnosis not present

## 2022-07-23 DIAGNOSIS — C7801 Secondary malignant neoplasm of right lung: Secondary | ICD-10-CM | POA: Diagnosis not present

## 2022-07-23 LAB — RAD ONC ARIA SESSION SUMMARY
Course Elapsed Days: 8
Plan Fractions Treated to Date: 7
Plan Fractions Treated to Date: 7
Plan Prescribed Dose Per Fraction: 3 Gy
Plan Prescribed Dose Per Fraction: 3 Gy
Plan Total Fractions Prescribed: 10
Plan Total Fractions Prescribed: 10
Plan Total Prescribed Dose: 30 Gy
Plan Total Prescribed Dose: 30 Gy
Reference Point Dosage Given to Date: 21 Gy
Reference Point Dosage Given to Date: 21 Gy
Reference Point Session Dosage Given: 3 Gy
Reference Point Session Dosage Given: 3 Gy
Session Number: 7

## 2022-07-23 MED FILL — Dexamethasone Sodium Phosphate Inj 100 MG/10ML: INTRAMUSCULAR | Qty: 2 | Status: AC

## 2022-07-23 NOTE — Progress Notes (Signed)
   Subjective:    Patient ID: Jonathon [name], male    DOB: 07/11/1935, 87 y.o.   MRN: [id number]  HPI Here with his daughter for 10 days of soreness around the anus, though not in the anus itself. There has been no bleeding. His BM's are regular. He is mostly wheelchair bound.   Review of Systems  Constitutional: Negative.   Respiratory: Negative.    Cardiovascular: Negative.   Gastrointestinal:  Positive for rectal pain.       Objective:   Physical Exam Constitutional:      Comments: In a wheelchair   Cardiovascular:     Rate and Rhythm: Normal rate and regular rhythm.     Pulses: Normal pulses.     Heart sounds: Normal heart sounds.  Pulmonary:     Effort: Pulmonary effort is normal.     Breath sounds: Normal breath sounds.  Genitourinary:    Comments: He has external hemorrhoids at the anus, but these do not appear to be irritated. There are several areas of very shallow skin breakdown over the ischial tuberosities  Neurological:     Mental Status: He is alert.           Assessment & Plan:  He has some very early pressure sores. He will keep the area as clean as possible. He will apply Aquaphor ointment to the areas TID.  Alysia Penna, MD

## 2022-07-24 ENCOUNTER — Inpatient Hospital Stay: Payer: Medicare Other

## 2022-07-24 ENCOUNTER — Other Ambulatory Visit: Payer: Self-pay

## 2022-07-24 ENCOUNTER — Ambulatory Visit
Admission: RE | Admit: 2022-07-24 | Discharge: 2022-07-24 | Disposition: A | Discharge status: Home / Self Care | Payer: Medicare Other | Source: Ambulatory Visit | Attending: Radiation Oncology | Admitting: Radiation Oncology

## 2022-07-24 VITALS — BP 136/64 | HR 59 | Temp 98.1°F | Resp 16

## 2022-07-24 DIAGNOSIS — C9001 Multiple myeloma in remission: Secondary | ICD-10-CM

## 2022-07-24 DIAGNOSIS — C7951 Secondary malignant neoplasm of bone: Secondary | ICD-10-CM

## 2022-07-24 DIAGNOSIS — Z7189 Other specified counseling: Secondary | ICD-10-CM

## 2022-07-24 DIAGNOSIS — C9000 Multiple myeloma not having achieved remission: Secondary | ICD-10-CM | POA: Diagnosis not present

## 2022-07-24 DIAGNOSIS — Z510 Encounter for antineoplastic radiation therapy: Secondary | ICD-10-CM | POA: Diagnosis not present

## 2022-07-24 DIAGNOSIS — C7801 Secondary malignant neoplasm of right lung: Secondary | ICD-10-CM | POA: Diagnosis not present

## 2022-07-24 DIAGNOSIS — Z5112 Encounter for antineoplastic immunotherapy: Secondary | ICD-10-CM | POA: Diagnosis not present

## 2022-07-24 LAB — RAD ONC ARIA SESSION SUMMARY
Course Elapsed Days: 9
Plan Fractions Treated to Date: 1
Plan Fractions Treated to Date: 8
Plan Prescribed Dose Per Fraction: 3 Gy
Plan Prescribed Dose Per Fraction: 3 Gy
Plan Total Fractions Prescribed: 1
Plan Total Fractions Prescribed: 10
Plan Total Prescribed Dose: 3 Gy
Plan Total Prescribed Dose: 30 Gy
Reference Point Dosage Given to Date: 24 Gy
Reference Point Dosage Given to Date: 24 Gy
Reference Point Session Dosage Given: 3 Gy
Reference Point Session Dosage Given: 3 Gy
Session Number: 8

## 2022-07-24 LAB — CBC WITH DIFFERENTIAL (CANCER CENTER ONLY)
Abs Immature Granulocytes: 0.08 10*3/uL — ABNORMAL HIGH (ref 0.00–0.07)
Basophils Absolute: 0 10*3/uL (ref 0.0–0.1)
Basophils Relative: 0 %
Eosinophils Absolute: 0 10*3/uL (ref 0.0–0.5)
Eosinophils Relative: 0 %
HCT: 32.1 % — ABNORMAL LOW (ref 39.0–52.0)
Hemoglobin: 11 g/dL — ABNORMAL LOW (ref 13.0–17.0)
Immature Granulocytes: 2 %
Lymphocytes Relative: 3 %
Lymphs Abs: 0.2 10*3/uL — ABNORMAL LOW (ref 0.7–4.0)
MCH: 29.3 pg (ref 26.0–34.0)
MCHC: 34.3 g/dL (ref 30.0–36.0)
MCV: 85.4 fL (ref 80.0–100.0)
Monocytes Absolute: 0.4 10*3/uL (ref 0.1–1.0)
Monocytes Relative: 8 %
Neutro Abs: 4.7 10*3/uL (ref 1.7–7.7)
Neutrophils Relative %: 87 %
Platelet Count: 121 10*3/uL — ABNORMAL LOW (ref 150–400)
RBC: 3.76 MIL/uL — ABNORMAL LOW (ref 4.22–5.81)
RDW: 17 % — ABNORMAL HIGH (ref 11.5–15.5)
WBC Count: 5.4 10*3/uL (ref 4.0–10.5)
nRBC: 0 % (ref 0.0–0.2)

## 2022-07-24 LAB — CMP (CANCER CENTER ONLY)
ALT: 28 U/L (ref 0–44)
AST: 27 U/L (ref 15–41)
Albumin: 3.3 g/dL — ABNORMAL LOW (ref 3.5–5.0)
Alkaline Phosphatase: 64 U/L (ref 38–126)
Anion gap: 13 (ref 5–15)
BUN: 70 mg/dL — ABNORMAL HIGH (ref 8–23)
CO2: 22 mmol/L (ref 22–32)
Calcium: 9 mg/dL (ref 8.9–10.3)
Chloride: 100 mmol/L (ref 98–111)
Creatinine: 1.57 mg/dL — ABNORMAL HIGH (ref 0.61–1.24)
GFR, Estimated: 43 mL/min — ABNORMAL LOW (ref >60)
Glucose, Bld: 120 mg/dL — ABNORMAL HIGH (ref 70–99)
Potassium: 4.6 mmol/L (ref 3.5–5.1)
Sodium: 135 mmol/L (ref 135–145)
Total Bilirubin: 0.7 mg/dL (ref 0.3–1.2)
Total Protein: 8.2 g/dL — ABNORMAL HIGH (ref 6.5–8.1)

## 2022-07-24 MED ORDER — MONTELUKAST SODIUM 10 MG PO TABS
10.0000 mg | ORAL_TABLET | Freq: Once | ORAL | Status: AC
  Administered 2022-07-24: 10 mg via ORAL
  Filled 2022-07-24: qty 1

## 2022-07-24 MED ORDER — ACETAMINOPHEN 325 MG PO TABS
650.0000 mg | ORAL_TABLET | Freq: Once | ORAL | Status: AC
  Administered 2022-07-24: 650 mg via ORAL
  Filled 2022-07-24: qty 2

## 2022-07-24 MED ORDER — SODIUM CHLORIDE 0.9 % IV SOLN
20.0000 mg | Freq: Once | INTRAVENOUS | Status: AC
  Administered 2022-07-24: 20 mg via INTRAVENOUS
  Filled 2022-07-24: qty 20

## 2022-07-24 MED ORDER — FAMOTIDINE IN NACL 20-0.9 MG/50ML-% IV SOLN
20.0000 mg | Freq: Once | INTRAVENOUS | Status: AC
  Administered 2022-07-24: 20 mg via INTRAVENOUS
  Filled 2022-07-24: qty 50

## 2022-07-24 MED ORDER — SODIUM CHLORIDE 0.9 % IV SOLN
10.0000 mg/kg | Freq: Once | INTRAVENOUS | Status: AC
  Administered 2022-07-24: 900 mg via INTRAVENOUS
  Filled 2022-07-24: qty 25
  Filled 2022-07-24: qty 45

## 2022-07-24 MED ORDER — DIPHENHYDRAMINE HCL 50 MG/ML IJ SOLN
50.0000 mg | Freq: Once | INTRAMUSCULAR | Status: AC
  Administered 2022-07-24: 50 mg via INTRAVENOUS
  Filled 2022-07-24: qty 1

## 2022-07-24 MED ORDER — SODIUM CHLORIDE 0.9 % IV SOLN: Freq: Once | INTRAVENOUS | Status: AC

## 2022-07-24 NOTE — Patient Instructions (Signed)
[facility]  Discharge Instructions: Thank you for choosing [facility] to provide your oncology and hematology care.   If you have a lab appointment with the Cancer Center, please go directly to the Cancer Center and check in at the registration area.   Wear comfortable clothing and clothing appropriate for easy access to any Portacath or PICC line.   We strive to give you quality time with your provider. You may need to reschedule your appointment if you arrive late (15 or more minutes).  Arriving late affects you and other patients whose appointments are after yours.  Also, if you miss three or more appointments without notifying the office, you may be dismissed from the clinic at the provider's discretion.      For prescription refill requests, have your pharmacy contact our office and allow 72 hours for refills to be completed.    Today you received the following chemotherapy and/or immunotherapy agents :  Isatuxamab      To help prevent nausea and vomiting after your treatment, we encourage you to take your nausea medication as directed.  BELOW ARE SYMPTOMS THAT SHOULD BE REPORTED IMMEDIATELY: *FEVER GREATER THAN 100.4 F (38 C) OR HIGHER *CHILLS OR SWEATING *NAUSEA AND VOMITING THAT IS NOT CONTROLLED WITH YOUR NAUSEA MEDICATION *UNUSUAL SHORTNESS OF BREATH *UNUSUAL BRUISING OR BLEEDING *URINARY PROBLEMS (pain or burning when urinating, or frequent urination) *BOWEL PROBLEMS (unusual diarrhea, constipation, pain near the anus) TENDERNESS IN MOUTH AND THROAT WITH OR WITHOUT PRESENCE OF ULCERS (sore throat, sores in mouth, or a toothache) UNUSUAL RASH, SWELLING OR PAIN  UNUSUAL VAGINAL DISCHARGE OR ITCHING   Items with * indicate a potential emergency and should be followed up as soon as possible or go to the Emergency Department if any problems should occur.  Please show the CHEMOTHERAPY ALERT CARD or IMMUNOTHERAPY ALERT CARD at  check-in to the Emergency Department and triage nurse.  Should you have questions after your visit or need to cancel or reschedule your appointment, please contact [facility]  Dept: [phone]  and follow the prompts.  Office hours are 8:00 a.m. to 4:30 p.m. Monday - Friday. Please note that voicemails left after 4:00 p.m. may not be returned until the following business day.  We are closed weekends and major holidays. You have access to a nurse at all times for urgent questions. Please call the main number to the clinic Dept: [phone] and follow the prompts.   For any non-urgent questions, you may also contact your provider using MyChart. We now offer e-Visits for anyone 18 and older to request care online for non-urgent symptoms. For details visit [email or web address].   Also download the MyChart app! Go to the app store, search "MyChart", open the app, select [facility], and log in with your MyChart username and password.   

## 2022-07-24 NOTE — Telephone Encounter (Signed)
Oral Oncology Patient Advocate Encounter  Called to confirm that no further appeals could be pursued by our office. Requested the program triage the case over to [facility] for final review.   [employer] Access Support phone [phone]  [employer] phone [phone]  I will continue to follow until final determination.   Lady Deutscher, CPhT-Adv Oncology Pharmacy Patient [place] Direct Number: [phone]  Fax: [phone]

## 2022-07-25 ENCOUNTER — Other Ambulatory Visit: Payer: Self-pay

## 2022-07-25 ENCOUNTER — Ambulatory Visit
Admission: RE | Admit: 2022-07-25 | Discharge: 2022-07-25 | Disposition: A | Discharge status: Home / Self Care | Payer: Medicare Other | Source: Ambulatory Visit | Attending: Radiation Oncology | Admitting: Radiation Oncology

## 2022-07-25 DIAGNOSIS — C7801 Secondary malignant neoplasm of right lung: Secondary | ICD-10-CM | POA: Diagnosis not present

## 2022-07-25 DIAGNOSIS — C7951 Secondary malignant neoplasm of bone: Secondary | ICD-10-CM | POA: Diagnosis not present

## 2022-07-25 DIAGNOSIS — Z5112 Encounter for antineoplastic immunotherapy: Secondary | ICD-10-CM | POA: Diagnosis not present

## 2022-07-25 DIAGNOSIS — Z510 Encounter for antineoplastic radiation therapy: Secondary | ICD-10-CM | POA: Diagnosis not present

## 2022-07-25 DIAGNOSIS — C9000 Multiple myeloma not having achieved remission: Secondary | ICD-10-CM | POA: Diagnosis not present

## 2022-07-25 LAB — RAD ONC ARIA SESSION SUMMARY
Course Elapsed Days: 10
Plan Fractions Treated to Date: 1
Plan Fractions Treated to Date: 9
Plan Prescribed Dose Per Fraction: 3 Gy
Plan Prescribed Dose Per Fraction: 3 Gy
Plan Total Fractions Prescribed: 10
Plan Total Fractions Prescribed: 2
Plan Total Prescribed Dose: 30 Gy
Plan Total Prescribed Dose: 6 Gy
Reference Point Dosage Given to Date: 27 Gy
Reference Point Dosage Given to Date: 3 Gy
Reference Point Session Dosage Given: 3 Gy
Reference Point Session Dosage Given: 3 Gy
Session Number: 9

## 2022-07-25 NOTE — Telephone Encounter (Signed)
Oral Oncology Patient Advocate Encounter   Received notification that the application for assistance for Pomalyst through [employer] has been approved.   [employer] phone number [phone].   Effective dates: 07/24/22 through 07/23/23  I have spoken to the patient.  Jonathon [name], CPhT-Adv Oncology Pharmacy Patient [place] Direct Number: [phone]  Fax: [phone]

## 2022-07-26 ENCOUNTER — Ambulatory Visit
Admission: RE | Admit: 2022-07-26 | Discharge: 2022-07-26 | Disposition: A | Discharge status: Home / Self Care | Payer: Medicare Other | Source: Ambulatory Visit | Attending: Radiation Oncology | Admitting: Radiation Oncology

## 2022-07-26 ENCOUNTER — Encounter: Payer: Self-pay | Admitting: Radiation Oncology

## 2022-07-26 ENCOUNTER — Other Ambulatory Visit: Payer: Self-pay

## 2022-07-26 DIAGNOSIS — C7801 Secondary malignant neoplasm of right lung: Secondary | ICD-10-CM | POA: Diagnosis not present

## 2022-07-26 DIAGNOSIS — C7951 Secondary malignant neoplasm of bone: Secondary | ICD-10-CM | POA: Diagnosis not present

## 2022-07-26 DIAGNOSIS — Z510 Encounter for antineoplastic radiation therapy: Secondary | ICD-10-CM | POA: Diagnosis not present

## 2022-07-26 DIAGNOSIS — C9000 Multiple myeloma not having achieved remission: Secondary | ICD-10-CM | POA: Diagnosis not present

## 2022-07-26 DIAGNOSIS — Z5112 Encounter for antineoplastic immunotherapy: Secondary | ICD-10-CM | POA: Diagnosis not present

## 2022-07-26 LAB — RAD ONC ARIA SESSION SUMMARY
Course Elapsed Days: 11
Plan Fractions Treated to Date: 10
Plan Fractions Treated to Date: 2
Plan Prescribed Dose Per Fraction: 3 Gy
Plan Prescribed Dose Per Fraction: 3 Gy
Plan Total Fractions Prescribed: 10
Plan Total Fractions Prescribed: 2
Plan Total Prescribed Dose: 30 Gy
Plan Total Prescribed Dose: 6 Gy
Reference Point Dosage Given to Date: 30 Gy
Reference Point Dosage Given to Date: 6 Gy
Reference Point Session Dosage Given: 3 Gy
Reference Point Session Dosage Given: 3 Gy
Session Number: 10

## 2022-07-27 ENCOUNTER — Other Ambulatory Visit: Payer: Self-pay

## 2022-07-27 ENCOUNTER — Encounter (HOSPITAL_BASED_OUTPATIENT_CLINIC_OR_DEPARTMENT_OTHER): Payer: Self-pay | Admitting: Urology

## 2022-07-27 ENCOUNTER — Emergency Department (HOSPITAL_BASED_OUTPATIENT_CLINIC_OR_DEPARTMENT_OTHER)
Admission: EM | Admit: 2022-07-27 | Discharge: 2022-07-27 | Disposition: A | Discharge status: Home / Self Care | Payer: Medicare Other | Attending: Emergency Medicine | Admitting: Emergency Medicine

## 2022-07-27 DIAGNOSIS — Z950 Presence of cardiac pacemaker: Secondary | ICD-10-CM | POA: Diagnosis not present

## 2022-07-27 DIAGNOSIS — E871 Hypo-osmolality and hyponatremia: Secondary | ICD-10-CM | POA: Diagnosis not present

## 2022-07-27 DIAGNOSIS — I509 Heart failure, unspecified: Secondary | ICD-10-CM | POA: Diagnosis not present

## 2022-07-27 DIAGNOSIS — D696 Thrombocytopenia, unspecified: Secondary | ICD-10-CM | POA: Diagnosis not present

## 2022-07-27 DIAGNOSIS — N390 Urinary tract infection, site not specified: Secondary | ICD-10-CM

## 2022-07-27 DIAGNOSIS — R300 Dysuria: Secondary | ICD-10-CM | POA: Diagnosis present

## 2022-07-27 DIAGNOSIS — Z8579 Personal history of other malignant neoplasms of lymphoid, hematopoietic and related tissues: Secondary | ICD-10-CM | POA: Insufficient documentation

## 2022-07-27 LAB — BASIC METABOLIC PANEL
Anion gap: 10 (ref 5–15)
BUN: 59 mg/dL — ABNORMAL HIGH (ref 8–23)
CO2: 20 mmol/L — ABNORMAL LOW (ref 22–32)
Calcium: 8.1 mg/dL — ABNORMAL LOW (ref 8.9–10.3)
Chloride: 99 mmol/L (ref 98–111)
Creatinine, Ser: 1.27 mg/dL — ABNORMAL HIGH (ref 0.61–1.24)
GFR, Estimated: 56 mL/min — ABNORMAL LOW (ref >60)
Glucose, Bld: 155 mg/dL — ABNORMAL HIGH (ref 70–99)
Potassium: 4.4 mmol/L (ref 3.5–5.1)
Sodium: 129 mmol/L — ABNORMAL LOW (ref 135–145)

## 2022-07-27 LAB — CBC
HCT: 30.7 % — ABNORMAL LOW (ref 39.0–52.0)
Hemoglobin: 10.2 g/dL — ABNORMAL LOW (ref 13.0–17.0)
MCH: 28.2 pg (ref 26.0–34.0)
MCHC: 33.2 g/dL (ref 30.0–36.0)
MCV: 84.8 fL (ref 80.0–100.0)
Platelets: 87 10*3/uL — ABNORMAL LOW (ref 150–400)
RBC: 3.62 MIL/uL — ABNORMAL LOW (ref 4.22–5.81)
RDW: 17 % — ABNORMAL HIGH (ref 11.5–15.5)
WBC: 4.7 10*3/uL (ref 4.0–10.5)
nRBC: 0 % (ref 0.0–0.2)

## 2022-07-27 LAB — URINALYSIS, ROUTINE W REFLEX MICROSCOPIC
Bilirubin Urine: NEGATIVE
Glucose, UA: 100 mg/dL — AB
Ketones, ur: NEGATIVE mg/dL
Nitrite: POSITIVE — AB
Protein, ur: 100 mg/dL — AB
Specific Gravity, Urine: 1.01 (ref 1.005–1.030)
pH: 5 (ref 5.0–8.0)

## 2022-07-27 LAB — URINALYSIS, MICROSCOPIC (REFLEX)

## 2022-07-27 MED ORDER — SODIUM CHLORIDE 0.9 % IV SOLN
1.0000 g | Freq: Once | INTRAVENOUS | Status: AC
  Administered 2022-07-27: 1 g via INTRAVENOUS
  Filled 2022-07-27: qty 10

## 2022-07-27 MED ORDER — SODIUM CHLORIDE 0.9 % IV SOLN: INTRAVENOUS | Status: DC | PRN

## 2022-07-27 NOTE — ED Notes (Signed)
No urine noted during post void bladder scan

## 2022-07-27 NOTE — ED Triage Notes (Signed)
Pt seen at urology yesterday per family and started on doxycycline for UTI Pt concerned he isnt better yet.  Not getting any worse Burning with urination

## 2022-07-27 NOTE — Discharge Instructions (Signed)
Continue the antibiotic you were given by your urologist.  We will call you if the urine culture today suggests you should be on a different antibiotic.  Follow-up with your primary care doctor or cancer doctors to have your platelet count and sodium level rechecked later this week.  Return to the ED for fevers worsening symptoms.

## 2022-07-27 NOTE — ED Provider Notes (Signed)
[place] [facility] AT [facility] [place] Provider Note   CSN: [id number] Arrival date & time: 07/27/22  1032     History  Chief Complaint  Patient presents with   Dysuria    Jonathon [name] is a 87 y.o. male.   Dysuria Presenting symptoms: dysuria      Has history of BPH, bradycardia, supraventricular tachycardia, CHF, status post pacemaker, multiple myeloma who presents with complaints of urinary discomfort for the last week.  Patient went to the urology doctor's office and had a urine culture and was started on doxycycline yesterday.  He does not feel any better today.  He has not had any fevers.  Does feel like it burns and he has to constantly urinate.  He also has history of urinary retention in the past but does not feel that his bladder is swollen  Home Medications Prior to Admission medications   Medication Sig Start Date End Date Taking? Authorizing Provider  acyclovir (ZOVIRAX) 400 MG tablet Take 1 tablet (400 mg total) by mouth 2 (two) times daily. 06/27/22   Brunetta Genera, MD  apixaban (ELIQUIS) 5 MG TABS tablet Take 1 tablet (5 mg total) by mouth 2 (two) times daily. 06/17/22   [name], MD  colchicine 0.6 MG tablet Take 2 tabs (1.2 mg) now.  Then take 1 tab (0.6 mg) 1 hour later.  Then take 1 tab daily until gout flare resolves. 11/21/21   [name], MD  dexamethasone (DECADRON) 4 MG tablet Take 5 tablets (20mg ) every 2 weeks on days 8 and 22 starting cycle 2. Repeat every 28 days. 06/27/22   Brunetta Genera, MD  dexamethasone (DECADRON) 4 MG tablet Take one tablet by mouth twice daily until otherwise instructed by RADIATION providers 07/11/22   [name], PA-C  diltiazem [facility]) 240 MG 24 hr capsule Take 1 capsule (240 mg total) by mouth daily. 06/17/22   [name], MD  flecainide (TAMBOCOR) 150 MG tablet Take 0.5 tablets (75 mg total) by mouth 2 (two) times daily. 06/17/22   [name], MD  furosemide (LASIX) 40  MG tablet Take 1 tablet (40 mg total) by mouth daily. 06/17/22   [name], MD  irbesartan (AVAPRO) 300 MG tablet Take 1 tablet (300 mg total) by mouth daily. Take 300 mg by mouth daily. 06/24/22   [name], MD  metoprolol tartrate (LOPRESSOR) 50 MG tablet Take 1 tablet (50 mg total) by mouth 2 (two) times daily. 06/17/22   [name], MD  ondansetron (ZOFRAN) 8 MG tablet Take 1 tablet (8 mg total) by mouth every 8 (eight) hours as needed for nausea or vomiting. 06/27/22   Brunetta Genera, MD  polyethylene glycol (MIRALAX / GLYCOLAX) 17 g packet Take 17 g by mouth daily. 06/24/22   [name], MD  pomalidomide (POMALYST) 2 MG capsule Take 1 capsule (2 mg total) by mouth daily. Take 21 days on, 7 days off, repeat every 28 days. 06/27/22   Brunetta Genera, MD  prochlorperazine (COMPAZINE) 10 MG tablet Take 1 tablet (10 mg total) by mouth every 6 (six) hours as needed for nausea or vomiting. 06/27/22   Brunetta Genera, MD  senna-docusate (SENOKOT-S) 8.6-50 MG tablet Take 2 tablets by mouth 2 (two) times daily as needed for mild constipation. 09/30/21   Gareth Morgan, MD  tamsulosin (FLOMAX) 0.4 MG CAPS capsule Take 2 capsules (0.8 mg total) by mouth daily. 06/24/22  [name], MD  traMADol (ULTRAM) 50 MG tablet Take 1 tablet (50 mg total) by mouth every 6 (six) hours as needed. 06/17/22   Brunetta Genera, MD      Allergies    Lexapro [escitalopram oxalate]    Review of Systems   Review of Systems  Genitourinary:  Positive for dysuria.    Physical Exam Updated Vital Signs BP (!) 142/78 (BP Location: Right Arm)   Pulse 74   Temp 98.1 F (36.7 C) (Oral)   Resp 18   Ht 1.778 m (5\' 10" )   Wt 82.1 kg   SpO2 100%   BMI 25.97 kg/m  Physical Exam Vitals and nursing note reviewed.  Constitutional:      Appearance: He is well-developed. He is not diaphoretic.  HENT:     Head: Normocephalic and atraumatic.     Right Ear: External ear normal.     Left  Ear: External ear normal.  Eyes:     General: No scleral icterus.       Right eye: No discharge.        Left eye: No discharge.     Conjunctiva/sclera: Conjunctivae normal.  Neck:     Trachea: No tracheal deviation.  Cardiovascular:     Rate and Rhythm: Normal rate.  Pulmonary:     Effort: Pulmonary effort is normal. No respiratory distress.     Breath sounds: No stridor.  Abdominal:     General: There is no distension.     Tenderness: There is no abdominal tenderness. There is no guarding.  Musculoskeletal:        General: No swelling or deformity.     Cervical back: Neck supple.  Skin:    General: Skin is warm and dry.     Findings: No rash.  Neurological:     Mental Status: He is alert. Mental status is at baseline.     Cranial Nerves: No dysarthria or facial asymmetry.     Motor: No seizure activity.     ED Results / Procedures / Treatments   Labs (all labs ordered are listed, but only abnormal results are displayed) Labs Reviewed  URINALYSIS, ROUTINE W REFLEX MICROSCOPIC - Abnormal; Notable for the following components:      Result Value   Color, Urine ORANGE (*)    APPearance CLOUDY (*)    Glucose, UA 100 (*)    Hgb urine dipstick LARGE (*)    Protein, ur 100 (*)    Nitrite POSITIVE (*)    Leukocytes,Ua LARGE (*)    All other components within normal limits  CBC - Abnormal; Notable for the following components:   RBC 3.62 (*)    Hemoglobin 10.2 (*)    HCT 30.7 (*)    RDW 17.0 (*)    Platelets 87 (*)    All other components within normal limits  BASIC METABOLIC PANEL - Abnormal; Notable for the following components:   Sodium 129 (*)    CO2 20 (*)    Glucose, Bld 155 (*)    BUN 59 (*)    Creatinine, Ser 1.27 (*)    Calcium 8.1 (*)    GFR, Estimated 56 (*)    All other components within normal limits  URINALYSIS, MICROSCOPIC (REFLEX) - Abnormal; Notable for the following components:   Bacteria, UA FEW (*)    All other components within normal limits   URINE CULTURE    EKG None  Radiology No results found.  Procedures Procedures  Medications Ordered in ED Medications  cefTRIAXone (ROCEPHIN) 1 g in sodium chloride 0.9 % 100 mL IVPB (has no administration in time range)  0.9 %  sodium chloride infusion (has no administration in time range)    ED Course/ Medical Decision Making/ A&P Clinical Course as of 07/27/22 1307  Sat Jul 27, 2022  1235 urine is suggestive of infection [JK]  [id number] Basic metabolic panel(!) Sodium level decreased at 129 [JK]  1258 BUN and creatinine elevated but decreased compared to previous values [JK]  1259 CBC shows stable anemia [JK]  1259 CBC(!) Thrombocytopenia noted.  Decreased compared to previous values [JK]    Clinical Course User Index [JK] Dorie Rank, MD                             Medical Decision Making Differential diagnosis includes but not limited to urinary tract infection and pyelonephritis acute renal failure bladder outlet obstruction  Problems Addressed: Hyponatremia: chronic illness or injury with exacerbation, progression, or side effects of treatment Lower urinary tract infectious disease: acute illness or injury Thrombocytopenia ([place]): chronic illness or injury with exacerbation, progression, or side effects of treatment  Amount and/or Complexity of Data Reviewed Labs: ordered. Decision-making details documented in ED Course.  Risk Prescription drug management.   Patient presented to ED with complaints of urinary discomfort.  Saw urologist as an outpatient.  Started on doxycycline just yesterday.  ED workup does not show any evidence of urinary retention.  Patient is not febrile.  No signs of systemic infection.  Urinalysis consistent with an acute infection.  Patient was given a dose of IV Rocephin.  Will have him continue the doxycycline for now but we will send off a urine culture today .  Patient noted to have hyponatremia and thrombocytopenia.  This was noted  previously.  Will have him follow-up with his doctor this coming week to be rechecked.        Final Clinical Impression(s) / ED Diagnoses Final diagnoses:  Lower urinary tract infectious disease  Hyponatremia  Thrombocytopenia ([place])    Rx / DC Orders ED Discharge Orders     None         Dorie Rank, MD 07/27/22 1307

## 2022-07-27 NOTE — ED Notes (Signed)
Pt urinated in lobby BR, states feels better and no longer having burning with urination

## 2022-07-28 LAB — URINE CULTURE: Culture: <10000 — AB

## 2022-07-29 ENCOUNTER — Encounter: Payer: Self-pay | Admitting: Hematology

## 2022-07-29 NOTE — Progress Notes (Signed)
                                                                                                                                                             Patient Name: Jonathon [name] MRN: [name] DOB: 11-Feb-1936 Referring Physician: [name] Date of Service: 07/26/2022 [facility], [place]                                                        End Of Treatment Note  Diagnoses: C90.00-Multiple myeloma not having achieved remission  Cancer Staging:   Multiple myeloma   Intent: Palliative  Radiation Treatment Dates: 07/15/2022 through 07/26/2022 Site Technique Total Dose (Gy) Dose per Fx (Gy) Completed Fx Beam Energies  Sacrum: Spine_Rt 3D 24/24 3 8/8 10X  Chest, Right: Chest_R 3D 30/30 3 10/10 6X  Sacrum: Spine_Bst_Rt 3D 6/6 3 2/2 10X   Narrative: The patient tolerated radiation therapy relatively well. He developed fatigue and anticipated skin changes in the treatment field. He also developed dysuria for which he was going to meet with urology to discuss.  Plan: The patient will receive a call in about one month from the radiation oncology department. He will continue follow up with Dr. Irene Limbo as well. He was started on dexamethasone prior to radiation due to weakness of the right lower extremity, and a steroid taper will be given as well.   ________________________________________________    Carola Rhine, PAC

## 2022-07-29 NOTE — Progress Notes (Signed)
  Radiation Oncology         [phone] ________________________________  Name: Jonathon [name]  [id number]  Date of Service: 07/29/22  DOB: [birth year]   Steroid Taper Instructions   You currently have a prescription for Dexamethasone 4 mg Tablets.    Beginning 07/29/22: Take 1/2 of a tablet (which is 2 mg) twice a day  Beginning 08/05/22: Take 1/2 of a tablet (which is 2 mg) once a day  Beginning 08/12/22: Take 1/2 of a tablet (which is 2 mg) every other day and stop on 08/17/22.   Please call our office if you have progressive extremity weakness or loss of sensation of the extremities or lack of control of bowel or bladder function.

## 2022-07-30 ENCOUNTER — Other Ambulatory Visit: Payer: Self-pay

## 2022-07-30 ENCOUNTER — Telehealth: Payer: Self-pay | Admitting: Hematology

## 2022-07-30 ENCOUNTER — Other Ambulatory Visit: Payer: Self-pay | Admitting: Hematology

## 2022-07-30 DIAGNOSIS — Z7189 Other specified counseling: Secondary | ICD-10-CM

## 2022-07-30 DIAGNOSIS — C9001 Multiple myeloma in remission: Secondary | ICD-10-CM

## 2022-07-30 MED ORDER — POMALIDOMIDE 2 MG PO CAPS: 2.0000 mg | ORAL_CAPSULE | Freq: Every day | ORAL | 0 refills | Status: DC

## 2022-07-30 NOTE — Addendum Note (Signed)
Addended byBritt Boozer on: 07/30/2022 03:07 PM   Modules accepted: Orders

## 2022-07-30 NOTE — Telephone Encounter (Signed)
Called patient per wq to confirm new appointments. Spoke with patient's daughter. Patient scheduled and will be notified. Informed daughter that nurse can come to scheduling to pick up copies of patient's calendar.

## 2022-07-30 NOTE — Telephone Encounter (Signed)
Oral Chemotherapy Pharmacist Encounter  I spoke with patient's daughter, [name], for overview of: Pomalyst (pomalidomide) for the treatment of relapsed multiple myeloma in conjunction with isatuximab and dexamethasone, planned duration until disease progression or unacceptable toxicity.   Counseled on administration, dosing, side effects, monitoring, drug-food interactions, safe handling, storage, and disposal.  Patient will take Pomalyst 2mg  capsules, 1 capsule by mouth once daily, without regard to food, with a full glass of water.  Pomalyst will be given 21 days on, 7 days off, repeat every 28 days.  Pomalyst start date: Patient will start once received in the mail - patient's daughter will call the patient assistance program today to ensure medication get shipped out  Adverse effects of Pomalyst include but are not limited to: nausea, GI upset, rash, fatigue, peripheral edema, and decreased blood counts.   VTE PPX: Patient continues on apixaban VZV PPX: Patient continues on acyclovir   Reviewed importance of keeping a medication schedule and plan for any missed doses. No barriers to medication adherence identified.  Medication reconciliation performed and medication/allergy list updated.   Insurance authorization for [employer] has been obtained.  Pomalyst prescription is being dispensed from [facility] specialty pharmacy as it is a limited distribution medication.  All questions answered.  Patient's daughter voiced understanding and appreciation.   Medication education handout placed in mail for patient. Patient's daughter knows to call the office with questions or concerns. Oral Chemotherapy Clinic phone number provided.   Leron Croak, PharmD, BCPS, [facility] Hematology/Oncology Clinical Pharmacist [facility] [place] [phone] 07/30/2022 12:08 PM

## 2022-07-31 NOTE — Progress Notes (Signed)
FMLA forms completed for Daughter Jonathon [name] as requested by Patient.

## 2022-08-03 ENCOUNTER — Emergency Department (HOSPITAL_COMMUNITY)
Admission: EM | Admit: 2022-08-03 | Discharge: 2022-08-03 | Disposition: A | Discharge status: Home / Self Care | Payer: Medicare Other | Attending: Emergency Medicine | Admitting: Emergency Medicine

## 2022-08-03 ENCOUNTER — Encounter (HOSPITAL_COMMUNITY): Payer: Self-pay

## 2022-08-03 ENCOUNTER — Other Ambulatory Visit: Payer: Self-pay

## 2022-08-03 DIAGNOSIS — I130 Hypertensive heart and chronic kidney disease with heart failure and stage 1 through stage 4 chronic kidney disease, or unspecified chronic kidney disease: Secondary | ICD-10-CM | POA: Insufficient documentation

## 2022-08-03 DIAGNOSIS — R319 Hematuria, unspecified: Secondary | ICD-10-CM | POA: Diagnosis not present

## 2022-08-03 DIAGNOSIS — J45909 Unspecified asthma, uncomplicated: Secondary | ICD-10-CM | POA: Insufficient documentation

## 2022-08-03 DIAGNOSIS — Z7901 Long term (current) use of anticoagulants: Secondary | ICD-10-CM | POA: Insufficient documentation

## 2022-08-03 DIAGNOSIS — Z79899 Other long term (current) drug therapy: Secondary | ICD-10-CM | POA: Diagnosis not present

## 2022-08-03 DIAGNOSIS — I2510 Atherosclerotic heart disease of native coronary artery without angina pectoris: Secondary | ICD-10-CM | POA: Diagnosis not present

## 2022-08-03 DIAGNOSIS — N183 Chronic kidney disease, stage 3 unspecified: Secondary | ICD-10-CM | POA: Insufficient documentation

## 2022-08-03 DIAGNOSIS — I959 Hypotension, unspecified: Secondary | ICD-10-CM | POA: Diagnosis not present

## 2022-08-03 DIAGNOSIS — R739 Hyperglycemia, unspecified: Secondary | ICD-10-CM | POA: Diagnosis not present

## 2022-08-03 DIAGNOSIS — Z8579 Personal history of other malignant neoplasms of lymphoid, hematopoietic and related tissues: Secondary | ICD-10-CM | POA: Insufficient documentation

## 2022-08-03 DIAGNOSIS — R300 Dysuria: Secondary | ICD-10-CM | POA: Diagnosis not present

## 2022-08-03 DIAGNOSIS — R339 Retention of urine, unspecified: Secondary | ICD-10-CM | POA: Diagnosis not present

## 2022-08-03 DIAGNOSIS — I5032 Chronic diastolic (congestive) heart failure: Secondary | ICD-10-CM | POA: Insufficient documentation

## 2022-08-03 LAB — CBC WITH DIFFERENTIAL/PLATELET
Abs Immature Granulocytes: 0.03 10*3/uL (ref 0.00–0.07)
Basophils Absolute: 0 10*3/uL (ref 0.0–0.1)
Basophils Relative: 0 %
Eosinophils Absolute: 0 10*3/uL (ref 0.0–0.5)
Eosinophils Relative: 0 %
HCT: 27.7 % — ABNORMAL LOW (ref 39.0–52.0)
Hemoglobin: 8.8 g/dL — ABNORMAL LOW (ref 13.0–17.0)
Immature Granulocytes: 1 %
Lymphocytes Relative: 3 %
Lymphs Abs: 0.1 10*3/uL — ABNORMAL LOW (ref 0.7–4.0)
MCH: 28.2 pg (ref 26.0–34.0)
MCHC: 31.8 g/dL (ref 30.0–36.0)
MCV: 88.8 fL (ref 80.0–100.0)
Monocytes Absolute: 0.1 10*3/uL (ref 0.1–1.0)
Monocytes Relative: 5 %
Neutro Abs: 2.2 10*3/uL (ref 1.7–7.7)
Neutrophils Relative %: 91 %
Platelets: UNDETERMINED 10*3/uL (ref 150–400)
RBC: 3.12 MIL/uL — ABNORMAL LOW (ref 4.22–5.81)
RDW: 17.6 % — ABNORMAL HIGH (ref 11.5–15.5)
WBC: 2.4 10*3/uL — ABNORMAL LOW (ref 4.0–10.5)
nRBC: 0 % (ref 0.0–0.2)

## 2022-08-03 LAB — BASIC METABOLIC PANEL
Anion gap: 8 (ref 5–15)
BUN: 70 mg/dL — ABNORMAL HIGH (ref 8–23)
CO2: 17 mmol/L — ABNORMAL LOW (ref 22–32)
Calcium: 6.8 mg/dL — ABNORMAL LOW (ref 8.9–10.3)
Chloride: 111 mmol/L (ref 98–111)
Creatinine, Ser: 1.44 mg/dL — ABNORMAL HIGH (ref 0.61–1.24)
GFR, Estimated: 48 mL/min — ABNORMAL LOW (ref >60)
Glucose, Bld: 180 mg/dL — ABNORMAL HIGH (ref 70–99)
Potassium: 4.4 mmol/L (ref 3.5–5.1)
Sodium: 136 mmol/L (ref 135–145)

## 2022-08-03 LAB — URINALYSIS, W/ REFLEX TO CULTURE (INFECTION SUSPECTED)
Bacteria, UA: NONE SEEN
Bilirubin Urine: NEGATIVE
Glucose, UA: 50 mg/dL — AB
Ketones, ur: NEGATIVE mg/dL
Leukocytes,Ua: NEGATIVE
Nitrite: POSITIVE — AB
Protein, ur: 30 mg/dL — AB
RBC / HPF: >50 RBC/hpf (ref 0–5)
Specific Gravity, Urine: 1.017 (ref 1.005–1.030)
pH: 5 (ref 5.0–8.0)

## 2022-08-03 MED ORDER — ACETAMINOPHEN 500 MG PO TABS
1000.0000 mg | ORAL_TABLET | Freq: Once | ORAL | Status: AC
  Administered 2022-08-03: 1000 mg via ORAL
  Filled 2022-08-03: qty 2

## 2022-08-03 MED ORDER — PHENAZOPYRIDINE HCL 200 MG PO TABS
200.0000 mg | ORAL_TABLET | Freq: Once | ORAL | Status: AC
  Administered 2022-08-03: 200 mg via ORAL
  Filled 2022-08-03: qty 1

## 2022-08-03 MED ORDER — PHENAZOPYRIDINE HCL 100 MG PO TABS: 100.0000 mg | ORAL_TABLET | Freq: Three times a day (TID) | ORAL | 0 refills | Status: AC

## 2022-08-03 NOTE — ED Triage Notes (Signed)
Pt. BIB [facility] from home. PT. Was diagnosed with a UTI 5 days ago and prescribed oral antibiotics. Also given IV antibiotics 2 days ago per EMS. Pt. Has developed increased confusion and hematuria per EMS. Pt. Is CAO x4 on arrival to the ED. Pt. Is a cancer patient and has a pacemaker.

## 2022-08-03 NOTE — ED Provider Notes (Signed)
[facility] Provider Note  CSN: [id number] Arrival date & time: 08/03/22 0130  Chief Complaint(s) Urinary Tract Infection  HPI Jonathon [name] is a 87 y.o. male with a past medical history listed below including multiple myeloma, paroxysmal A-fib on Eliquis who is currently being treated for urinary tract infection who presents to the emergency department for persistent dysuria.  Patient is currently on doxycycline.  Reports that he continues to have the dysuria and hematuria.  He feels like he is unable to fully empty his bladder.  Endorses suprapubic abdominal discomfort but is mostly concerned with the burning dysuria.  Patient also reported that he has had difficulty getting around.  States that he uses a walker to move but this evening his legs felt weak.  HPI   Past Medical History Past Medical History:  Diagnosis Date   Asthma    BENIGN PROSTATIC HYPERTROPHY, HX OF 10/25/2008   Bone metastases 04/14/2018   BRADYCARDIA 2005   Chronic diastolic congestive heart failure ([place]) 06/05/2016   CIDP (chronic inflammatory demyelinating polyneuropathy) ([place])    HYPERTENSION 11/21/2006   Iron deficiency anemia, unspecified 04/19/2013   Multiple myeloma ([place]) 04/29/2018   NEPHROLITHIASIS, HX OF 11/21/2006 and 10/15/2008   PACEMAKER, PERMANENT 2005   Gen change 2014 Medtronic Adaptic L dual-chamber pacemaker, serial [id number]    SVT (supraventricular tachycardia)    TOBACCO ABUSE 10/25/2008   Quit 2012   Patient Active Problem List   Diagnosis Date Noted   Paroxysmal atrial fibrillation ([place]) 10/11/2021   Secondary hypercoagulable state ([place]) 10/11/2021   Counseling regarding advance care planning and goals of care 04/29/2018   Multiple myeloma ([place]) 04/29/2018   Malignant neoplasm metastatic to bone ([place]) 04/14/2018   Hypercalcemia 04/14/2018   Lytic bone lesions on xray 03/29/2018   Displaced fracture of lateral end of right clavicle with  routine healing 03/29/2018   Fall 09/05/2017   Sepsis secondary to UTI ([place]) 09/05/2017   Urinary retention 09/05/2017   AKI (acute kidney injury) ([place]) 09/05/2017   CAD (coronary artery disease) 09/05/2017   Superficial vein thrombosis 08/19/2017   Altered mental status    Metabolic encephalopathy [id number]   Dementia ([place]) 08/04/2017   Seizure ([place]) 08/01/2017   Thiamine deficiency 05/27/2017   B12 deficiency 04/30/2017   Alcoholism ([place]) 01/28/2017   Acute encephalopathy 12/28/2016   Hepatic steatosis 06/05/2016   Physical deconditioning 06/05/2016   Chronic diastolic congestive heart failure ([place]) 06/05/2016   Orthostatic syncope    Elevated liver function tests    LOC (loss of consciousness) ([place]) 08/10/2014   CIDP (chronic inflammatory demyelinating polyneuropathy) ([place]) 08/08/2014   Sustained SVT ([place]) 07/27/2014   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 Authorizing Provider  phenazopyridine (PYRIDIUM) 100 MG tablet Take 1 tablet (100 mg total) by mouth 3 (three) times daily for 3 days. 08/03/22 08/06/22 Yes Clint Biello, Grayce Sessions, MD  acyclovir (ZOVIRAX) 400 MG tablet Take 1 tablet (400 mg total) by mouth 2 (two) times daily.  06/27/22   Brunetta Genera, MD  apixaban (ELIQUIS) 5 MG TABS tablet Take 1 tablet (5 mg total) by mouth 2 (two) times daily. 06/17/22   [name], MD  colchicine 0.6 MG tablet Take 2 tabs (1.2  mg) now.  Then take 1 tab (0.6 mg) 1 hour later.  Then take 1 tab daily until gout flare resolves. 11/21/21   [name], MD  dexamethasone (DECADRON) 4 MG tablet Take 5 tablets (20mg ) every 2 weeks on days 8 and 22 starting cycle 2. Repeat every 28 days. 06/27/22   Brunetta Genera, MD  dexamethasone (DECADRON) 4 MG tablet Take one tablet by mouth twice daily until otherwise instructed by RADIATION providers 07/11/22   Hayden , PA-C  diltiazem [facility]) 240 MG 24 hr capsule Take 1 capsule (240 mg total) by mouth daily. 06/17/22   [name], MD  flecainide (TAMBOCOR) 150 MG tablet Take 0.5 tablets (75 mg total) by mouth 2 (two) times daily. 06/17/22   [name], MD  furosemide (LASIX) 40 MG tablet Take 1 tablet (40 mg total) by mouth daily. 06/17/22   [name], MD  irbesartan (AVAPRO) 300 MG tablet Take 1 tablet (300 mg total) by mouth daily. Take 300 mg by mouth daily. 06/24/22   [name], MD  metoprolol tartrate (LOPRESSOR) 50 MG tablet Take 1 tablet (50 mg total) by mouth 2 (two) times daily. 06/17/22   [name], MD  ondansetron (ZOFRAN) 8 MG tablet Take 1 tablet (8 mg total) by mouth every 8 (eight) hours as needed for nausea or vomiting. 06/27/22   Brunetta Genera, MD  polyethylene glycol (MIRALAX / GLYCOLAX) 17 g packet Take 17 g by mouth daily. 06/24/22   [name], MD  pomalidomide (POMALYST) 2 MG capsule Take 1 capsule (2 mg total) by mouth daily. Take 21 days on, 7 days off, repeat every 28 days. [place] [id number]; Date Obtained: 07/08/22 07/30/22   Brunetta Genera, MD  prochlorperazine (COMPAZINE) 10 MG tablet Take 1 tablet (10 mg total) by mouth every 6 (six) hours as needed for nausea or vomiting. 06/27/22   Brunetta Genera, MD  senna-docusate (SENOKOT-S) 8.6-50 MG tablet Take 2 tablets by mouth 2 (two) times daily as needed for mild constipation. 09/30/21   Gareth Morgan, MD  tamsulosin (FLOMAX) 0.4 MG CAPS capsule Take  2 capsules (0.8 mg total) by mouth daily. 06/24/22   [name], MD  traMADol (ULTRAM) 50 MG tablet Take 1 tablet (50 mg total) by mouth every 6 (six) hours as needed. 06/17/22   Brunetta Genera, MD                                                                                                                                    Allergies Lexapro [escitalopram oxalate]  Review of Systems Review of Systems As noted in  HPI  Physical Exam Vital Signs  I have reviewed the triage vital signs BP 117/76   Pulse 63   Temp 97.7 F (36.5 C) (Oral)   Resp 17   SpO2 99%   Physical Exam Vitals reviewed.  Constitutional:      General: He is not in acute distress.    Appearance: He is well-developed. He is not diaphoretic.  HENT:     Head: Normocephalic and atraumatic.     Right Ear: External ear normal.     Left Ear: External ear normal.     Nose: Nose normal.     Mouth/Throat:     Mouth: Mucous membranes are moist.  Eyes:     General: No scleral icterus.    Conjunctiva/sclera: Conjunctivae normal.  Neck:     Trachea: Phonation normal.  Cardiovascular:     Rate and Rhythm: Normal rate and regular rhythm.  Pulmonary:     Effort: Pulmonary effort is normal. No respiratory distress.     Breath sounds: No stridor.  Abdominal:     General: There is no distension.     Tenderness: There is abdominal tenderness (mild) in the suprapubic area.  Musculoskeletal:        General: Normal range of motion.     Cervical back: Normal range of motion.  Neurological:     Mental Status: He is alert and oriented to person, place, and time.  Psychiatric:        Behavior: Behavior normal.     ED Results and Treatments Labs (all labs ordered are listed, but only abnormal results are displayed) Labs Reviewed  CBC WITH DIFFERENTIAL/PLATELET - Abnormal; Notable for the following components:      Result Value   WBC 2.4 (*)    RBC 3.12 (*)    Hemoglobin 8.8 (*)    HCT 27.7 (*)    RDW 17.6  (*)    Lymphs Abs 0.1 (*)    All other components within normal limits  BASIC METABOLIC PANEL - Abnormal; Notable for the following components:   CO2 17 (*)    Glucose, Bld 180 (*)    BUN 70 (*)    Creatinine, Ser 1.44 (*)    Calcium 6.8 (*)    GFR, Estimated 48 (*)    All other components within normal limits  URINALYSIS, W/ REFLEX TO CULTURE (INFECTION SUSPECTED) - Abnormal; Notable for the following components:   Color, Urine AMBER (*)    Glucose, UA 50 (*)    Hgb urine dipstick LARGE (*)    Protein, ur 30 (*)    Nitrite POSITIVE (*)    All other components within normal limits                                                                                                                         EKG  EKG Interpretation  Date/Time:    Ventricular Rate:    PR Interval:    QRS Duration:  QT Interval:    QTC Calculation:   R Axis:     Text Interpretation:         Radiology No results found.  Medications Ordered in ED Medications  acetaminophen (TYLENOL) tablet 1,000 mg (1,000 mg Oral Given 08/03/22 0524)  phenazopyridine (PYRIDIUM) tablet 200 mg (200 mg Oral Given 08/03/22 0524)                                                                                                                                     Procedures Procedures  (including critical care time)  Medical Decision Making / ED Course  Click here for ABCD2, HEART and other calculators  Medical Decision Making Amount and/or Complexity of Data Reviewed Labs: ordered. Decision-making details documented in ED Course.  Risk OTC drugs. Prescription drug management.    Patient presents with dysuria. On review of records, urine culture from March 16 did not grow significant bacterial colonies. Bladder scan revealed patient had more than 300 cc in the bladder Foley catheter inserted  CBC without leukocytosis.  Mild anemia   Metabolic panel without significant electrolyte derangements.  Renal  insufficiency with stable renal function. Repeat UA positive for nitrites but likely due to Pyridium use.  Negative leukocytes or WBCs.  Not consistent with infection.  Patient was able to walk using walker.    Final Clinical Impression(s) / ED Diagnoses Final diagnoses:  Urinary retention  Hematuria, unspecified type  Dysuria   The patient appears reasonably screened and/or stabilized for discharge and I doubt any other medical condition or other [facility] requiring further screening, evaluation, or treatment in the ED at this time. I have discussed the findings, Dx and Tx plan with the patient/family who expressed understanding and agree(s) with the plan. Discharge instructions discussed at length. The patient/family was given strict return precautions who verbalized understanding of the instructions. No further questions at time of discharge.  Disposition: Discharge  Condition: Good  ED Discharge Orders          Ordered    phenazopyridine (PYRIDIUM) 100 MG tablet  3 times daily        08/03/22 [id number]             Follow Up: [name], MD [place] [address] [phone]  Call  as needed  Janith Lima, [place] [address] [phone]  Call  to schedule an appointment for close follow up           This chart was dictated using voice recognition software.  Despite best efforts to proofread,  errors can occur which can change the documentation meaning.    Fatima Blank, MD 08/03/22 [phone]

## 2022-08-05 ENCOUNTER — Other Ambulatory Visit: Payer: Self-pay

## 2022-08-05 DIAGNOSIS — C9001 Multiple myeloma in remission: Secondary | ICD-10-CM

## 2022-08-06 ENCOUNTER — Telehealth: Payer: Self-pay

## 2022-08-06 MED FILL — Dexamethasone Sodium Phosphate Inj 100 MG/10ML: INTRAMUSCULAR | Qty: 2 | Status: AC

## 2022-08-06 NOTE — Transitions of Care (Post Inpatient/ED Visit) (Signed)
   08/06/2022  Name: Jonathon [name] MRN: [id number] DOB: [birth year]  Today's TOC FU Call Status: Today's TOC FU Call Status:: Unsuccessul Call (1st Attempt) Unsuccessful Call (1st Attempt) Date: 08/06/22  Attempted to reach the patient regarding the most recent Inpatient/ED visit.  Follow Up Plan: Additional outreach attempts will be made to reach the patient to complete the Transitions of Care (Post Inpatient/ED visit) call.     Enzo Montgomery, RN,BSN,CCM [facility] Care Management Care Management Community Coordinator Direct Phone: [phone] Toll Free: [phone] Fax: [phone]

## 2022-08-07 ENCOUNTER — Observation Stay (HOSPITAL_COMMUNITY): Payer: Medicare Other

## 2022-08-07 ENCOUNTER — Inpatient Hospital Stay: Payer: Medicare Other

## 2022-08-07 ENCOUNTER — Other Ambulatory Visit: Payer: Medicare Other

## 2022-08-07 ENCOUNTER — Encounter (HOSPITAL_COMMUNITY): Payer: Self-pay

## 2022-08-07 ENCOUNTER — Inpatient Hospital Stay (HOSPITAL_COMMUNITY)
Admission: RE | Admit: 2022-08-07 | Discharge: 2022-09-11 | DRG: 871 | Disposition: E | Payer: Medicare Other | Source: Ambulatory Visit | Attending: Internal Medicine | Admitting: Internal Medicine

## 2022-08-07 ENCOUNTER — Ambulatory Visit: Payer: Medicare Other

## 2022-08-07 ENCOUNTER — Other Ambulatory Visit: Payer: Self-pay

## 2022-08-07 ENCOUNTER — Ambulatory Visit: Payer: Medicare Other | Admitting: Physician Assistant

## 2022-08-07 DIAGNOSIS — Z452 Encounter for adjustment and management of vascular access device: Secondary | ICD-10-CM | POA: Diagnosis not present

## 2022-08-07 DIAGNOSIS — Z87891 Personal history of nicotine dependence: Secondary | ICD-10-CM

## 2022-08-07 DIAGNOSIS — M7989 Other specified soft tissue disorders: Secondary | ICD-10-CM

## 2022-08-07 DIAGNOSIS — M899 Disorder of bone, unspecified: Secondary | ICD-10-CM | POA: Diagnosis not present

## 2022-08-07 DIAGNOSIS — N400 Enlarged prostate without lower urinary tract symptoms: Secondary | ICD-10-CM | POA: Diagnosis present

## 2022-08-07 DIAGNOSIS — Z79899 Other long term (current) drug therapy: Secondary | ICD-10-CM

## 2022-08-07 DIAGNOSIS — E872 Acidosis, unspecified: Secondary | ICD-10-CM | POA: Diagnosis present

## 2022-08-07 DIAGNOSIS — Z841 Family history of disorders of kidney and ureter: Secondary | ICD-10-CM

## 2022-08-07 DIAGNOSIS — Z7189 Other specified counseling: Secondary | ICD-10-CM

## 2022-08-07 DIAGNOSIS — I480 Paroxysmal atrial fibrillation: Secondary | ICD-10-CM | POA: Diagnosis present

## 2022-08-07 DIAGNOSIS — N170 Acute kidney failure with tubular necrosis: Secondary | ICD-10-CM | POA: Diagnosis present

## 2022-08-07 DIAGNOSIS — J45909 Unspecified asthma, uncomplicated: Secondary | ICD-10-CM | POA: Diagnosis present

## 2022-08-07 DIAGNOSIS — A419 Sepsis, unspecified organism: Secondary | ICD-10-CM | POA: Diagnosis not present

## 2022-08-07 DIAGNOSIS — M898X9 Other specified disorders of bone, unspecified site: Secondary | ICD-10-CM | POA: Diagnosis present

## 2022-08-07 DIAGNOSIS — Z7901 Long term (current) use of anticoagulants: Secondary | ICD-10-CM

## 2022-08-07 DIAGNOSIS — G6181 Chronic inflammatory demyelinating polyneuritis: Secondary | ICD-10-CM | POA: Diagnosis present

## 2022-08-07 DIAGNOSIS — Z515 Encounter for palliative care: Secondary | ICD-10-CM | POA: Diagnosis not present

## 2022-08-07 DIAGNOSIS — R6521 Severe sepsis with septic shock: Secondary | ICD-10-CM | POA: Diagnosis not present

## 2022-08-07 DIAGNOSIS — F0390 Unspecified dementia without behavioral disturbance: Secondary | ICD-10-CM | POA: Diagnosis present

## 2022-08-07 DIAGNOSIS — C9002 Multiple myeloma in relapse: Secondary | ICD-10-CM | POA: Diagnosis not present

## 2022-08-07 DIAGNOSIS — D709 Neutropenia, unspecified: Secondary | ICD-10-CM

## 2022-08-07 DIAGNOSIS — R4589 Other symptoms and signs involving emotional state: Secondary | ICD-10-CM | POA: Diagnosis not present

## 2022-08-07 DIAGNOSIS — E860 Dehydration: Secondary | ICD-10-CM | POA: Diagnosis present

## 2022-08-07 DIAGNOSIS — I10 Essential (primary) hypertension: Secondary | ICD-10-CM | POA: Diagnosis present

## 2022-08-07 DIAGNOSIS — N179 Acute kidney failure, unspecified: Secondary | ICD-10-CM | POA: Diagnosis not present

## 2022-08-07 DIAGNOSIS — E870 Hyperosmolality and hypernatremia: Secondary | ICD-10-CM

## 2022-08-07 DIAGNOSIS — Z803 Family history of malignant neoplasm of breast: Secondary | ICD-10-CM

## 2022-08-07 DIAGNOSIS — A4152 Sepsis due to Pseudomonas: Principal | ICD-10-CM | POA: Diagnosis present

## 2022-08-07 DIAGNOSIS — Z888 Allergy status to other drugs, medicaments and biological substances status: Secondary | ICD-10-CM

## 2022-08-07 DIAGNOSIS — R531 Weakness: Secondary | ICD-10-CM

## 2022-08-07 DIAGNOSIS — E875 Hyperkalemia: Secondary | ICD-10-CM | POA: Diagnosis present

## 2022-08-07 DIAGNOSIS — R338 Other retention of urine: Secondary | ICD-10-CM | POA: Diagnosis present

## 2022-08-07 DIAGNOSIS — R5381 Other malaise: Secondary | ICD-10-CM | POA: Diagnosis present

## 2022-08-07 DIAGNOSIS — I5032 Chronic diastolic (congestive) heart failure: Secondary | ICD-10-CM | POA: Diagnosis present

## 2022-08-07 DIAGNOSIS — C9000 Multiple myeloma not having achieved remission: Secondary | ICD-10-CM | POA: Diagnosis present

## 2022-08-07 DIAGNOSIS — D61810 Antineoplastic chemotherapy induced pancytopenia: Secondary | ICD-10-CM | POA: Diagnosis not present

## 2022-08-07 DIAGNOSIS — C9001 Multiple myeloma in remission: Secondary | ICD-10-CM

## 2022-08-07 DIAGNOSIS — C7800 Secondary malignant neoplasm of unspecified lung: Secondary | ICD-10-CM | POA: Diagnosis present

## 2022-08-07 DIAGNOSIS — L538 Other specified erythematous conditions: Secondary | ICD-10-CM | POA: Diagnosis not present

## 2022-08-07 DIAGNOSIS — L989 Disorder of the skin and subcutaneous tissue, unspecified: Secondary | ICD-10-CM | POA: Diagnosis present

## 2022-08-07 DIAGNOSIS — G9341 Metabolic encephalopathy: Secondary | ICD-10-CM | POA: Diagnosis not present

## 2022-08-07 DIAGNOSIS — L8990 Pressure ulcer of unspecified site, unspecified stage: Secondary | ICD-10-CM

## 2022-08-07 DIAGNOSIS — F0394 Unspecified dementia, unspecified severity, with anxiety: Secondary | ICD-10-CM | POA: Diagnosis present

## 2022-08-07 DIAGNOSIS — I130 Hypertensive heart and chronic kidney disease with heart failure and stage 1 through stage 4 chronic kidney disease, or unspecified chronic kidney disease: Secondary | ICD-10-CM | POA: Diagnosis not present

## 2022-08-07 DIAGNOSIS — Z8249 Family history of ischemic heart disease and other diseases of the circulatory system: Secondary | ICD-10-CM

## 2022-08-07 DIAGNOSIS — T451X5A Adverse effect of antineoplastic and immunosuppressive drugs, initial encounter: Secondary | ICD-10-CM | POA: Diagnosis not present

## 2022-08-07 DIAGNOSIS — C7951 Secondary malignant neoplasm of bone: Secondary | ICD-10-CM | POA: Diagnosis present

## 2022-08-07 DIAGNOSIS — R5081 Fever presenting with conditions classified elsewhere: Secondary | ICD-10-CM | POA: Diagnosis not present

## 2022-08-07 DIAGNOSIS — Z66 Do not resuscitate: Secondary | ICD-10-CM | POA: Diagnosis not present

## 2022-08-07 DIAGNOSIS — G893 Neoplasm related pain (acute) (chronic): Secondary | ICD-10-CM | POA: Diagnosis not present

## 2022-08-07 DIAGNOSIS — R339 Retention of urine, unspecified: Secondary | ICD-10-CM | POA: Diagnosis present

## 2022-08-07 DIAGNOSIS — B965 Pseudomonas (aeruginosa) (mallei) (pseudomallei) as the cause of diseases classified elsewhere: Secondary | ICD-10-CM | POA: Diagnosis not present

## 2022-08-07 DIAGNOSIS — R238 Other skin changes: Secondary | ICD-10-CM

## 2022-08-07 DIAGNOSIS — D759 Disease of blood and blood-forming organs, unspecified: Secondary | ICD-10-CM | POA: Diagnosis present

## 2022-08-07 DIAGNOSIS — R1310 Dysphagia, unspecified: Secondary | ICD-10-CM | POA: Diagnosis present

## 2022-08-07 DIAGNOSIS — N401 Enlarged prostate with lower urinary tract symptoms: Secondary | ICD-10-CM | POA: Diagnosis present

## 2022-08-07 DIAGNOSIS — Z950 Presence of cardiac pacemaker: Secondary | ICD-10-CM

## 2022-08-07 DIAGNOSIS — R7881 Bacteremia: Secondary | ICD-10-CM

## 2022-08-07 DIAGNOSIS — E88819 Insulin resistance, unspecified: Secondary | ICD-10-CM | POA: Diagnosis present

## 2022-08-07 DIAGNOSIS — N183 Chronic kidney disease, stage 3 unspecified: Secondary | ICD-10-CM | POA: Diagnosis present

## 2022-08-07 DIAGNOSIS — R52 Pain, unspecified: Secondary | ICD-10-CM | POA: Diagnosis not present

## 2022-08-07 DIAGNOSIS — G610 Guillain-Barre syndrome: Secondary | ICD-10-CM | POA: Diagnosis present

## 2022-08-07 DIAGNOSIS — E8809 Other disorders of plasma-protein metabolism, not elsewhere classified: Secondary | ICD-10-CM | POA: Diagnosis present

## 2022-08-07 DIAGNOSIS — R609 Edema, unspecified: Secondary | ICD-10-CM | POA: Diagnosis not present

## 2022-08-07 DIAGNOSIS — R4182 Altered mental status, unspecified: Secondary | ICD-10-CM | POA: Diagnosis not present

## 2022-08-07 DIAGNOSIS — Z833 Family history of diabetes mellitus: Secondary | ICD-10-CM

## 2022-08-07 DIAGNOSIS — Z820 Family history of epilepsy and other diseases of the nervous system: Secondary | ICD-10-CM

## 2022-08-07 LAB — CMP (CANCER CENTER ONLY)
ALT: 27 U/L (ref 0–44)
AST: 13 U/L — ABNORMAL LOW (ref 15–41)
Albumin: 2.9 g/dL — ABNORMAL LOW (ref 3.5–5.0)
Alkaline Phosphatase: 52 U/L (ref 38–126)
Anion gap: 13 (ref 5–15)
BUN: 70 mg/dL — ABNORMAL HIGH (ref 8–23)
CO2: 21 mmol/L — ABNORMAL LOW (ref 22–32)
Calcium: 8.6 mg/dL — ABNORMAL LOW (ref 8.9–10.3)
Chloride: 102 mmol/L (ref 98–111)
Creatinine: 1.51 mg/dL — ABNORMAL HIGH (ref 0.61–1.24)
GFR, Estimated: 45 mL/min — ABNORMAL LOW (ref >60)
Glucose, Bld: 307 mg/dL — ABNORMAL HIGH (ref 70–99)
Potassium: 5 mmol/L (ref 3.5–5.1)
Sodium: 136 mmol/L (ref 135–145)
Total Bilirubin: 0.6 mg/dL (ref 0.3–1.2)
Total Protein: 6.2 g/dL — ABNORMAL LOW (ref 6.5–8.1)

## 2022-08-07 LAB — CBC WITH DIFFERENTIAL (CANCER CENTER ONLY)
Abs Immature Granulocytes: 0 10*3/uL (ref 0.00–0.07)
Basophils Absolute: 0 10*3/uL (ref 0.0–0.1)
Basophils Relative: 0 %
Eosinophils Absolute: 0 10*3/uL (ref 0.0–0.5)
Eosinophils Relative: 0 %
HCT: 28.8 % — ABNORMAL LOW (ref 39.0–52.0)
Hemoglobin: 9.7 g/dL — ABNORMAL LOW (ref 13.0–17.0)
Lymphocytes Relative: 5 %
Lymphs Abs: 0.1 10*3/uL — ABNORMAL LOW (ref 0.7–4.0)
MCH: 29.3 pg (ref 26.0–34.0)
MCHC: 33.7 g/dL (ref 30.0–36.0)
MCV: 87 fL (ref 80.0–100.0)
Monocytes Absolute: 0.1 10*3/uL (ref 0.1–1.0)
Monocytes Relative: 6 %
Neutro Abs: 1.8 10*3/uL (ref 1.7–7.7)
Neutrophils Relative %: 89 %
Platelet Count: 21 10*3/uL — ABNORMAL LOW (ref 150–400)
RBC: 3.31 MIL/uL — ABNORMAL LOW (ref 4.22–5.81)
RDW: 17.4 % — ABNORMAL HIGH (ref 11.5–15.5)
Smear Review: NORMAL
WBC Count: 2 10*3/uL — ABNORMAL LOW (ref 4.0–10.5)
nRBC: 1 % — ABNORMAL HIGH (ref 0.0–0.2)

## 2022-08-07 LAB — URINALYSIS, COMPLETE (UACMP) WITH MICROSCOPIC
Bacteria, UA: NONE SEEN
Bilirubin Urine: NEGATIVE
Glucose, UA: NEGATIVE mg/dL
Ketones, ur: NEGATIVE mg/dL
Leukocytes,Ua: NEGATIVE
Nitrite: NEGATIVE
Protein, ur: NEGATIVE mg/dL
Specific Gravity, Urine: 1.015 (ref 1.005–1.030)
pH: 5 (ref 5.0–8.0)

## 2022-08-07 LAB — IRON AND TIBC
Iron: 69 ug/dL (ref 45–182)
Saturation Ratios: 34 % (ref 17.9–39.5)
TIBC: 202 ug/dL — ABNORMAL LOW (ref 250–450)
UIBC: 133 ug/dL

## 2022-08-07 LAB — VITAMIN B12: Vitamin B-12: 232 pg/mL (ref 180–914)

## 2022-08-07 LAB — FERRITIN: Ferritin: 688 ng/mL — ABNORMAL HIGH (ref 24–336)

## 2022-08-07 LAB — FOLATE: Folate: 4.6 ng/mL — ABNORMAL LOW (ref >5.9)

## 2022-08-07 MED ORDER — TRAMADOL HCL 50 MG PO TABS: 50.0000 mg | ORAL_TABLET | Freq: Four times a day (QID) | ORAL | Status: DC | PRN

## 2022-08-07 MED ORDER — ACETAMINOPHEN 325 MG PO TABS: 650.0000 mg | ORAL_TABLET | Freq: Four times a day (QID) | ORAL | Status: DC | PRN

## 2022-08-07 MED ORDER — ONDANSETRON HCL 4 MG/2ML IJ SOLN: 4.0000 mg | Freq: Four times a day (QID) | INTRAMUSCULAR | Status: DC | PRN

## 2022-08-07 MED ORDER — ONDANSETRON HCL 4 MG PO TABS: 4.0000 mg | ORAL_TABLET | Freq: Four times a day (QID) | ORAL | Status: DC | PRN

## 2022-08-07 MED ORDER — POTASSIUM CHLORIDE IN NACL 20-0.9 MEQ/L-% IV SOLN
INTRAVENOUS | Status: DC
  Filled 2022-08-07: qty 1000

## 2022-08-07 MED ORDER — DOCUSATE SODIUM 100 MG PO CAPS
100.0000 mg | ORAL_CAPSULE | Freq: Two times a day (BID) | ORAL | Status: DC
  Administered 2022-08-07: 100 mg via ORAL
  Filled 2022-08-07: qty 1

## 2022-08-07 MED ORDER — APIXABAN 5 MG PO TABS: 5.0000 mg | ORAL_TABLET | Freq: Two times a day (BID) | ORAL | Status: DC

## 2022-08-07 MED ORDER — ACETAMINOPHEN 650 MG RE SUPP: 650.0000 mg | Freq: Four times a day (QID) | RECTAL | Status: DC | PRN

## 2022-08-07 MED ORDER — TRAZODONE HCL 50 MG PO TABS: 25.0000 mg | ORAL_TABLET | Freq: Every evening | ORAL | Status: DC | PRN

## 2022-08-07 MED ORDER — POLYETHYLENE GLYCOL 3350 17 G PO PACK: 17.0000 g | PACK | Freq: Every day | ORAL | Status: DC | PRN

## 2022-08-07 NOTE — Progress Notes (Signed)
Pt arrived on unit with daughter at bedside. Pt A+Ox4, however presenting with severe lower extremity weakness and unable to stand and pivot safely to bed. Total dependence on staff assistance and use of stedy to transfer pt to bed. At time of transfer, pt showed signs of vasovagal response and was safely assisted onto bed by staff. Rapid Response RN called for bedside assessment. VS obtained, WNL. IV access started. Pt recovered quickly once laying in bed, though clearly fatigued. MD paged by Rapid Response RN, to assess pt at bedside.

## 2022-08-07 NOTE — Progress Notes (Signed)
Brief Hematology/Oncology Progress Note  Clinical Summary: [name] is an 87 year old male with medical history significant for multiple myeloma who was admitted due to concern for profound weakness and cytopenias.  Interval History: -- Blood counts today show white blood cell count 2.0, hemoglobin 9.7, and platelets of 21.  This is markedly declined from his prior treatment approximately 2 weeks ago. -- Patient normally ambulatory and functional, living by himself.  Now unfortunately unable to stand unassisted. -- Patient had syncopal episode transferring from chair to bed in hospital room. -- Patient's daughter at bedside during our exam today, patient resting peacefully.  O:  Vitals:   08/07/22 1442 08/07/22 1448  BP: 123/62 123/62  Pulse: 75 74  Resp: 20 19  Temp: (!) 97.5 F (36.4 C) (!) 97.5 F (36.4 C)  SpO2: 100% 99%      Latest Ref Rng & Units 08/07/2022   10:13 AM 08/03/2022    2:35 AM 07/27/2022   11:11 AM  CMP  Glucose 70 - 99 mg/dL 307  180  155   BUN 8 - 23 mg/dL 70  70  59   Creatinine 0.61 - 1.24 mg/dL 1.51  1.44  1.27   Sodium 135 - 145 mmol/L 136  136  129   Potassium 3.5 - 5.1 mmol/L 5.0  4.4  4.4   Chloride 98 - 111 mmol/L 102  111  99   CO2 22 - 32 mmol/L 21  17  20    Calcium 8.9 - 10.3 mg/dL 8.6  6.8  8.1   Total Protein 6.5 - 8.1 g/dL 6.2     Total Bilirubin 0.3 - 1.2 mg/dL 0.6     Alkaline Phos 38 - 126 U/L 52     AST 15 - 41 U/L 13     ALT 0 - 44 U/L 27         Latest Ref Rng & Units 08/07/2022   10:13 AM 08/03/2022    2:35 AM 07/27/2022   11:11 AM  CBC  WBC 4.0 - 10.5 K/uL 2.0  2.4  4.7   Hemoglobin 13.0 - 17.0 g/dL 9.7  8.8  10.2   Hematocrit 39.0 - 52.0 % 28.8  27.7  30.7   Platelets 150 - 400 K/uL 21  PLATELET CLUMPS NOTED ON SMEAR, UNABLE TO ESTIMATE  87       GENERAL: Chronically ill-appearing elderly African-American male, in NAD  SKIN: skin color, texture, turgor are normal, no rashes or significant lesions EYES: conjunctiva are  pink and non-injected, sclera clear LUNGS: clear to auscultation and percussion with normal breathing effort HEART: regular rate & rhythm and no murmurs and no lower extremity edema Musculoskeletal: no cyanosis of digits and no clubbing  PSYCH: alert & oriented x 3, fluent speech NEURO: no focal motor/sensory deficits  Assessment/Plan:  # Weakness # Pancytopenia -- Patient unable to be safely discharged from the chemotherapy room to home due to severe weakness. -- Recommend PT OT evaluation. -- Recommend consideration of infectious workup to include chest x-ray, urine culture, urinalysis, and blood cultures. -- Will hold on chemotherapy to see if this helps improve blood counts. -- Additionally will order nutritional studies to include vitamin B12, folate, and iron studies. -- Oncology will continue to follow.  # Multiple Myeloma --recently started Isatuximab/Pomolidimide/Dexamethasone treatment  --recommend HOLDING treatment until counts recover. --pomolidimide may be the source of the cytopenias. May need to restart at a lower dose when resuming treatment.   Ledell Peoples,  MD Department of Hematology/Oncology [place] [facility] Phone: [phone] Pager: [phone] Email: [email or web address]

## 2022-08-07 NOTE — H&P (Addendum)
History and Physical  PANAYOTIS DECICCO [id number] DOB: 1935-06-10 DOA: 07/29/2022  PCP: [name], MD   Chief Complaint: Weakness  HPI: Jonathon [name] is a 87 y.o. male with medical history significant for multiple myeloma with bone metastases and polyneuropathy, paroxysmal atrial fibrillation on Eliquis, being admitted to the hospital for weakness and cytopenias.  I was called by Dr. [name] his covering oncologist, as there was concern for infections significant weakness since he lives alone, he was also noted on blood counts today to show significant cytopenia decline from his prior treatment 2 weeks ago, today with white blood cell count 2.0, hemoglobin 9.7, and platelets of 21.  At baseline, the patient is ambulatory and functional, living by himself, unfortunately today he was found to be unable to stand unassisted.  History is provided by the patient who seems to be slightly confused, and his daughter who is with him today at the bedside.  Of note, he was treated as an outpatient for a possible urinary tract infection was found to have urinary retention recently, and is currently on a Foley catheter with plans to see urology as an outpatient.  Patient and daughter deny any chest pain, shortness of breath, nausea, vomiting, fevers, chills.  The patient was directly admitted from the oncology center, according to nursing staff he had a vasovagal event when standing to be transferred in the room.  He did not fall, regained consciousness very quickly and is alert and oriented x 4.  Of note, the patient does live alone with some home health.  His daughter is closely involved in his care.  Review of Systems: Please see HPI for pertinent positives and negatives. A complete 10 system review of systems are otherwise negative.  Past Medical History:  Diagnosis Date   Asthma    BENIGN PROSTATIC HYPERTROPHY, HX OF 10/25/2008   Bone metastases 04/14/2018   BRADYCARDIA 2005   Chronic diastolic  congestive heart failure ([place]) 06/05/2016   CIDP (chronic inflammatory demyelinating polyneuropathy) ([place])    HYPERTENSION 11/21/2006   Iron deficiency anemia, unspecified 04/19/2013   Multiple myeloma ([place]) 04/29/2018   NEPHROLITHIASIS, HX OF 11/21/2006 and 10/15/2008   PACEMAKER, PERMANENT 2005   Gen change 2014 Medtronic Adaptic L dual-chamber pacemaker, serial [id number]    SVT (supraventricular tachycardia)    TOBACCO ABUSE 10/25/2008   Quit 2012   Past Surgical History:  Procedure Laterality Date   COLONOSCOPY  2004,2009   PACEMAKER INSERTION  05/14/2003   PERMANENT PACEMAKER GENERATOR CHANGE N/A 06/19/2012   Procedure: PERMANENT PACEMAKER GENERATOR CHANGE;  Surgeon: [name], MD; Medtronic Adaptic L dual-chamber pacemaker, serial [id number]     SKIN GRAFT Right 05/13/1960   wrist   TOOTH EXTRACTION  11/10/2020   3 teeth removed    Social History:  reports that he quit smoking about 12 years ago. His smoking use included cigarettes. He started smoking about 57 years ago. He has a 11.50 pack-year smoking history. He has never used smokeless tobacco. He reports current alcohol use of about 6.0 - 10.0 standard drinks of alcohol per week. He reports that he does not use drugs.   Allergies  Allergen Reactions   Lexapro [Escitalopram Oxalate]     3/30-08/16/17 ? Serotonin Syndrome    Family History  Problem Relation Age of Onset   Heart attack Father        Died, 52   Kidney disease Father    Parkinson's disease Mother  Died, 86   Breast cancer Sister        Living, 45   Diabetes type II Brother        Living, 27   Breast cancer Sister        Living, 77   Diabetes Daughter        Living, 42   Diabetes Son        Living, 13   Ovarian cancer Daughter    Colon cancer Neg Hx    Rectal cancer Neg Hx    Stomach cancer Neg Hx      Prior to Admission medications   Medication Sig Start Date End Date Taking? Authorizing Provider  acyclovir (ZOVIRAX) 400 MG tablet  Take 1 tablet (400 mg total) by mouth 2 (two) times daily. 06/27/22  Yes Brunetta Genera, MD  apixaban (ELIQUIS) 5 MG TABS tablet Take 1 tablet (5 mg total) by mouth 2 (two) times daily. 06/17/22  Yes [name], MD  pomalidomide (POMALYST) 2 MG capsule Take 1 capsule (2 mg total) by mouth daily. Take 21 days on, 7 days off, repeat every 28 days. [name] [id number]; Date Obtained: 07/08/22 07/30/22  Yes Brunetta Genera, MD  colchicine 0.6 MG tablet Take 2 tabs (1.2 mg) now.  Then take 1 tab (0.6 mg) 1 hour later.  Then take 1 tab daily until gout flare resolves. Patient not taking: Reported on 07/12/2022 11/21/21   [name], MD  dexamethasone (DECADRON) 4 MG tablet Take 5 tablets (20mg ) every 2 weeks on days 8 and 22 starting cycle 2. Repeat every 28 days. 06/27/22   Brunetta Genera, MD  dexamethasone (DECADRON) 4 MG tablet Take one tablet by mouth twice daily until otherwise instructed by RADIATION providers Patient not taking: Reported on 08/06/2022 07/11/22   [name], PA-C  diltiazem [facility]) 240 MG 24 hr capsule Take 1 capsule (240 mg total) by mouth daily. 06/17/22   [name], MD  flecainide (TAMBOCOR) 150 MG tablet Take 0.5 tablets (75 mg total) by mouth 2 (two) times daily. 06/17/22   [name], MD  furosemide (LASIX) 40 MG tablet Take 1 tablet (40 mg total) by mouth daily. 06/17/22   [name], MD  irbesartan (AVAPRO) 300 MG tablet Take 1 tablet (300 mg total) by mouth daily. Take 300 mg by mouth daily. 06/24/22   [name], MD  metoprolol tartrate (LOPRESSOR) 50 MG tablet Take 1 tablet (50 mg total) by mouth 2 (two) times daily. 06/17/22   [name], MD  ondansetron (ZOFRAN) 8 MG tablet Take 1 tablet (8 mg total) by mouth every 8 (eight) hours as needed for nausea or vomiting. 06/27/22   Brunetta Genera, MD  polyethylene glycol (MIRALAX / GLYCOLAX) 17 g packet Take 17 g by mouth daily. 06/24/22   [name], MD   prochlorperazine (COMPAZINE) 10 MG tablet Take 1 tablet (10 mg total) by mouth every 6 (six) hours as needed for nausea or vomiting. 06/27/22   Brunetta Genera, MD  senna-docusate (SENOKOT-S) 8.6-50 MG tablet Take 2 tablets by mouth 2 (two) times daily as needed for mild constipation. 09/30/21   Gareth Morgan, MD  tamsulosin (FLOMAX) 0.4 MG CAPS capsule Take 2 capsules (0.8 mg total) by mouth daily. 06/24/22   [name], MD  traMADol (ULTRAM) 50 MG tablet Take 1 tablet (50 mg total) by mouth every 6 (six) hours as needed. 06/17/22   Sullivan Lone  Vivien Rossetti, MD    Physical Exam: BP 123/62 (BP Location: Right Arm)   Pulse 74   Temp (!) 97.5 F (36.4 C) (Oral)   Resp 19   SpO2 99%   General:  Alert, oriented, calm, in no acute distress; thin elderly gentleman lying in bed appearing his stated age.  He has a nontender soft tissue swelling on the central crown of his head.  No surrounding erythema, induration.  Daughter states that has been there for about 3 weeks. Eyes: EOMI, clear conjuctivae, white sclerea Neck: supple, no masses, trachea mildline  Cardiovascular: RRR, no murmurs or rubs, no peripheral edema  Respiratory: clear to auscultation bilaterally, no wheezes, no crackles  Abdomen: soft, nontender, nondistended, normal bowel tones heard  Skin: dry, no rashes  Musculoskeletal: no joint effusions, normal range of motion  Psychiatric: appropriate affect, normal speech  Neurologic: extraocular muscles intact, clear speech, moving all extremities with intact sensorium          Labs on Admission:  Basic Metabolic Panel: Recent Labs  Lab 08/03/22 0235 07/27/2022 1013  NA 136 136  K 4.4 5.0  CL 111 102  CO2 17* 21*  GLUCOSE 180* 307*  BUN 70* 70*  CREATININE 1.44* 1.51*  CALCIUM 6.8* 8.6*   Liver Function Tests: Recent Labs  Lab 07/21/2022 1013  AST 13*  ALT 27  ALKPHOS 52  BILITOT 0.6  PROT 6.2*  ALBUMIN 2.9*   No results for input(s): "LIPASE", "AMYLASE" in  the last 168 hours. No results for input(s): "AMMONIA" in the last 168 hours. CBC: Recent Labs  Lab 08/03/22 0235 08/03/2022 1013  WBC 2.4* 2.0*  NEUTROABS 2.2 1.8  HGB 8.8* 9.7*  HCT 27.7* 28.8*  MCV 88.8 87.0  PLT PLATELET CLUMPS NOTED ON SMEAR, UNABLE TO ESTIMATE 21*   Cardiac Enzymes: No results for input(s): "CKTOTAL", "CKMB", "CKMBINDEX", "TROPONINI" in the last 168 hours.  BNP (last 3 results) No results for input(s): "BNP" in the last 8760 hours.  ProBNP (last 3 results) No results for input(s): "PROBNP" in the last 8760 hours.  CBG: No results for input(s): "GLUCAP" in the last 168 hours.  Radiological Exams on Admission: No results found.  Assessment/Plan Principal Problem:   Weakness-currently unclear etiology of his weakness and ambulatory dysfunction, could be a host of things including worsening anemia, dehydration, chemotherapy effect, acute infection polyradiculopathy or combination of all of those. -The patient certainly cannot go home and live on his own in this condition, will be admitted to the hospitalist service for observation -Placed on diabetic diet -PT/OT consult -Oncology aware of his admission, will follow -Chronic home medications to be continued once reconciled, will hold meds that can cause hypotension -No signs or symptoms of infection, but will check UA and culture if indicated  Other problems:   Essential hypertension   BPH (benign prostatic hyperplasia)   Polyradiculoneuropathy (HCC)   Chronic diastolic congestive heart failure (HCC)-appears stable, with no evidence of exacerbation currently   Dementia (HCC)   Urinary retention-continue Foley catheter   Lytic bone lesions on xray   Malignant neoplasm metastatic to bone (HCC)   Multiple myeloma (HCC)   CKD (chronic kidney disease) stage 3, GFR 30-59 ml/min (HCC)   Paroxysmal atrial fibrillation (HCC)-on Eliquis, will hold for now due to low platelet count   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  Consults called: Directly admitted from Dr. [name], who will follow in the hospital  Admission status: Observation  Time spent: 48 minutes  Kemet Nijjar Neva Seat MD [facility] Pager [phone]  If 7PM-7AM, please contact night-coverage [email or web address] Password [facility]  07/21/2022, 3:49 PM

## 2022-08-08 ENCOUNTER — Inpatient Hospital Stay (HOSPITAL_COMMUNITY): Payer: Medicare Other

## 2022-08-08 ENCOUNTER — Observation Stay (HOSPITAL_COMMUNITY): Payer: Medicare Other

## 2022-08-08 DIAGNOSIS — I130 Hypertensive heart and chronic kidney disease with heart failure and stage 1 through stage 4 chronic kidney disease, or unspecified chronic kidney disease: Secondary | ICD-10-CM | POA: Diagnosis present

## 2022-08-08 DIAGNOSIS — D709 Neutropenia, unspecified: Secondary | ICD-10-CM

## 2022-08-08 DIAGNOSIS — Z515 Encounter for palliative care: Secondary | ICD-10-CM | POA: Diagnosis not present

## 2022-08-08 DIAGNOSIS — D61810 Antineoplastic chemotherapy induced pancytopenia: Secondary | ICD-10-CM | POA: Diagnosis present

## 2022-08-08 DIAGNOSIS — Z66 Do not resuscitate: Secondary | ICD-10-CM | POA: Diagnosis present

## 2022-08-08 DIAGNOSIS — R5081 Fever presenting with conditions classified elsewhere: Secondary | ICD-10-CM | POA: Diagnosis not present

## 2022-08-08 DIAGNOSIS — R609 Edema, unspecified: Secondary | ICD-10-CM | POA: Diagnosis not present

## 2022-08-08 DIAGNOSIS — Z452 Encounter for adjustment and management of vascular access device: Secondary | ICD-10-CM | POA: Diagnosis not present

## 2022-08-08 DIAGNOSIS — R7881 Bacteremia: Secondary | ICD-10-CM | POA: Diagnosis not present

## 2022-08-08 DIAGNOSIS — R6521 Severe sepsis with septic shock: Secondary | ICD-10-CM | POA: Diagnosis present

## 2022-08-08 DIAGNOSIS — R4182 Altered mental status, unspecified: Secondary | ICD-10-CM | POA: Diagnosis not present

## 2022-08-08 DIAGNOSIS — B965 Pseudomonas (aeruginosa) (mallei) (pseudomallei) as the cause of diseases classified elsewhere: Secondary | ICD-10-CM | POA: Diagnosis not present

## 2022-08-08 DIAGNOSIS — G6181 Chronic inflammatory demyelinating polyneuritis: Secondary | ICD-10-CM | POA: Diagnosis present

## 2022-08-08 DIAGNOSIS — C7800 Secondary malignant neoplasm of unspecified lung: Secondary | ICD-10-CM | POA: Diagnosis present

## 2022-08-08 DIAGNOSIS — R4589 Other symptoms and signs involving emotional state: Secondary | ICD-10-CM | POA: Diagnosis not present

## 2022-08-08 DIAGNOSIS — E8809 Other disorders of plasma-protein metabolism, not elsewhere classified: Secondary | ICD-10-CM | POA: Diagnosis present

## 2022-08-08 DIAGNOSIS — J45909 Unspecified asthma, uncomplicated: Secondary | ICD-10-CM | POA: Diagnosis present

## 2022-08-08 DIAGNOSIS — R52 Pain, unspecified: Secondary | ICD-10-CM | POA: Diagnosis not present

## 2022-08-08 DIAGNOSIS — Z79899 Other long term (current) drug therapy: Secondary | ICD-10-CM | POA: Diagnosis not present

## 2022-08-08 DIAGNOSIS — N170 Acute kidney failure with tubular necrosis: Secondary | ICD-10-CM | POA: Diagnosis present

## 2022-08-08 DIAGNOSIS — C7951 Secondary malignant neoplasm of bone: Secondary | ICD-10-CM | POA: Diagnosis present

## 2022-08-08 DIAGNOSIS — A419 Sepsis, unspecified organism: Secondary | ICD-10-CM | POA: Diagnosis not present

## 2022-08-08 DIAGNOSIS — E870 Hyperosmolality and hypernatremia: Secondary | ICD-10-CM | POA: Diagnosis present

## 2022-08-08 DIAGNOSIS — G893 Neoplasm related pain (acute) (chronic): Secondary | ICD-10-CM | POA: Diagnosis present

## 2022-08-08 DIAGNOSIS — L538 Other specified erythematous conditions: Secondary | ICD-10-CM | POA: Diagnosis not present

## 2022-08-08 DIAGNOSIS — R531 Weakness: Secondary | ICD-10-CM | POA: Diagnosis not present

## 2022-08-08 DIAGNOSIS — E872 Acidosis, unspecified: Secondary | ICD-10-CM | POA: Diagnosis present

## 2022-08-08 DIAGNOSIS — M899 Disorder of bone, unspecified: Secondary | ICD-10-CM | POA: Diagnosis not present

## 2022-08-08 DIAGNOSIS — Z7189 Other specified counseling: Secondary | ICD-10-CM | POA: Diagnosis not present

## 2022-08-08 DIAGNOSIS — I5032 Chronic diastolic (congestive) heart failure: Secondary | ICD-10-CM | POA: Diagnosis present

## 2022-08-08 DIAGNOSIS — C9002 Multiple myeloma in relapse: Secondary | ICD-10-CM | POA: Diagnosis present

## 2022-08-08 DIAGNOSIS — A4152 Sepsis due to Pseudomonas: Secondary | ICD-10-CM | POA: Diagnosis present

## 2022-08-08 DIAGNOSIS — F0394 Unspecified dementia, unspecified severity, with anxiety: Secondary | ICD-10-CM | POA: Diagnosis present

## 2022-08-08 DIAGNOSIS — N179 Acute kidney failure, unspecified: Secondary | ICD-10-CM | POA: Diagnosis not present

## 2022-08-08 DIAGNOSIS — E860 Dehydration: Secondary | ICD-10-CM | POA: Diagnosis present

## 2022-08-08 DIAGNOSIS — L8990 Pressure ulcer of unspecified site, unspecified stage: Secondary | ICD-10-CM

## 2022-08-08 DIAGNOSIS — G9341 Metabolic encephalopathy: Secondary | ICD-10-CM | POA: Diagnosis present

## 2022-08-08 DIAGNOSIS — N183 Chronic kidney disease, stage 3 unspecified: Secondary | ICD-10-CM | POA: Diagnosis present

## 2022-08-08 DIAGNOSIS — T451X5A Adverse effect of antineoplastic and immunosuppressive drugs, initial encounter: Secondary | ICD-10-CM | POA: Diagnosis not present

## 2022-08-08 DIAGNOSIS — I480 Paroxysmal atrial fibrillation: Secondary | ICD-10-CM | POA: Diagnosis present

## 2022-08-08 DIAGNOSIS — E875 Hyperkalemia: Secondary | ICD-10-CM | POA: Diagnosis present

## 2022-08-08 DIAGNOSIS — D759 Disease of blood and blood-forming organs, unspecified: Secondary | ICD-10-CM | POA: Diagnosis not present

## 2022-08-08 LAB — BLOOD GAS, VENOUS
Acid-base deficit: 13.7 mmol/L — ABNORMAL HIGH (ref 0.0–2.0)
Bicarbonate: 11.1 mmol/L — ABNORMAL LOW (ref 20.0–28.0)
O2 Saturation: 76 %
Patient temperature: 37
pCO2, Ven: 21 mmHg — ABNORMAL LOW (ref 44–60)
pH, Ven: 7.33 (ref 7.25–7.43)
pO2, Ven: 48 mmHg — ABNORMAL HIGH (ref 32–45)

## 2022-08-08 LAB — BASIC METABOLIC PANEL
Anion gap: 11 (ref 5–15)
Anion gap: 12 (ref 5–15)
BUN: 76 mg/dL — ABNORMAL HIGH (ref 8–23)
BUN: 79 mg/dL — ABNORMAL HIGH (ref 8–23)
CO2: 19 mmol/L — ABNORMAL LOW (ref 22–32)
CO2: 17 mmol/L — ABNORMAL LOW (ref 22–32)
Calcium: 7.7 mg/dL — ABNORMAL LOW (ref 8.9–10.3)
Calcium: 7.5 mg/dL — ABNORMAL LOW (ref 8.9–10.3)
Chloride: 103 mmol/L (ref 98–111)
Chloride: 105 mmol/L (ref 98–111)
Creatinine, Ser: 2.58 mg/dL — ABNORMAL HIGH (ref 0.61–1.24)
Creatinine, Ser: 2.65 mg/dL — ABNORMAL HIGH (ref 0.61–1.24)
GFR, Estimated: 24 mL/min — ABNORMAL LOW (ref >60)
GFR, Estimated: 23 mL/min — ABNORMAL LOW (ref >60)
Glucose, Bld: 260 mg/dL — ABNORMAL HIGH (ref 70–99)
Glucose, Bld: 240 mg/dL — ABNORMAL HIGH (ref 70–99)
Potassium: 5.2 mmol/L — ABNORMAL HIGH (ref 3.5–5.1)
Potassium: 5.3 mmol/L — ABNORMAL HIGH (ref 3.5–5.1)
Sodium: 133 mmol/L — ABNORMAL LOW (ref 135–145)
Sodium: 134 mmol/L — ABNORMAL LOW (ref 135–145)

## 2022-08-08 LAB — URINE CULTURE: Culture: NO GROWTH

## 2022-08-08 LAB — CBC
HCT: 17.4 % — ABNORMAL LOW (ref 39.0–52.0)
HCT: 22.4 % — ABNORMAL LOW (ref 39.0–52.0)
HCT: 28.7 % — ABNORMAL LOW (ref 39.0–52.0)
Hemoglobin: 5.7 g/dL — CL (ref 13.0–17.0)
Hemoglobin: 7.3 g/dL — ABNORMAL LOW (ref 13.0–17.0)
Hemoglobin: 9.2 g/dL — ABNORMAL LOW (ref 13.0–17.0)
MCH: 28.4 pg (ref 26.0–34.0)
MCH: 28.7 pg (ref 26.0–34.0)
MCH: 28.3 pg (ref 26.0–34.0)
MCHC: 32.8 g/dL (ref 30.0–36.0)
MCHC: 32.6 g/dL (ref 30.0–36.0)
MCHC: 32.1 g/dL (ref 30.0–36.0)
MCV: 86.6 fL (ref 80.0–100.0)
MCV: 88.2 fL (ref 80.0–100.0)
MCV: 88.3 fL (ref 80.0–100.0)
Platelets: 15 10*3/uL — CL (ref 150–400)
Platelets: 15 10*3/uL — CL (ref 150–400)
Platelets: 18 10*3/uL — CL (ref 150–400)
RBC: 2.01 MIL/uL — ABNORMAL LOW (ref 4.22–5.81)
RBC: 2.54 MIL/uL — ABNORMAL LOW (ref 4.22–5.81)
RBC: 3.25 MIL/uL — ABNORMAL LOW (ref 4.22–5.81)
RDW: 17.6 % — ABNORMAL HIGH (ref 11.5–15.5)
RDW: 18.2 % — ABNORMAL HIGH (ref 11.5–15.5)
RDW: 18.5 % — ABNORMAL HIGH (ref 11.5–15.5)
WBC: 1.2 10*3/uL — CL (ref 4.0–10.5)
WBC: 0.6 10*3/uL — CL (ref 4.0–10.5)
WBC: 0.6 10*3/uL — CL (ref 4.0–10.5)
nRBC: 2 % — ABNORMAL HIGH (ref 0.0–0.2)
nRBC: 0 % (ref 0.0–0.2)
nRBC: 4.9 % — ABNORMAL HIGH (ref 0.0–0.2)

## 2022-08-08 LAB — GLUCOSE, CAPILLARY
Glucose-Capillary: 204 mg/dL — ABNORMAL HIGH (ref 70–99)
Glucose-Capillary: 221 mg/dL — ABNORMAL HIGH (ref 70–99)
Glucose-Capillary: 250 mg/dL — ABNORMAL HIGH (ref 70–99)
Glucose-Capillary: 250 mg/dL — ABNORMAL HIGH (ref 70–99)
Glucose-Capillary: 283 mg/dL — ABNORMAL HIGH (ref 70–99)
Glucose-Capillary: 355 mg/dL — ABNORMAL HIGH (ref 70–99)

## 2022-08-08 LAB — BLOOD CULTURE ID PANEL (REFLEXED) - BCID2

## 2022-08-08 LAB — LACTIC ACID, PLASMA: Lactic Acid, Venous: 4.1 mmol/L (ref 0.5–1.9)

## 2022-08-08 LAB — BLOOD GAS, ARTERIAL
Acid-base deficit: 3.6 mmol/L — ABNORMAL HIGH (ref 0.0–2.0)
Bicarbonate: 17.1 mmol/L — ABNORMAL LOW (ref 20.0–28.0)
Drawn by: 54496
FIO2: 21 %
O2 Saturation: 96.7 %
Patient temperature: 38.1
pCO2 arterial: 22 mmHg — ABNORMAL LOW (ref 32–48)
pH, Arterial: 7.5 — ABNORMAL HIGH (ref 7.35–7.45)
pO2, Arterial: 104 mmHg (ref 83–108)

## 2022-08-08 LAB — DIC (DISSEMINATED INTRAVASCULAR COAGULATION)PANEL
D-Dimer, Quant: 0.58 ug/mL-FEU — ABNORMAL HIGH (ref 0.00–0.50)
Fibrinogen: 378 mg/dL (ref 210–475)
INR: 2.3 — ABNORMAL HIGH (ref 0.8–1.2)
Platelets: 17 10*3/uL — CL (ref 150–400)
Prothrombin Time: 24.7 seconds — ABNORMAL HIGH (ref 11.4–15.2)
Smear Review: NONE SEEN
aPTT: 34 seconds (ref 24–36)

## 2022-08-08 LAB — MAGNESIUM: Magnesium: 2 mg/dL (ref 1.7–2.4)

## 2022-08-08 LAB — TSH: TSH: 0.078 u[IU]/mL — ABNORMAL LOW (ref 0.350–4.500)

## 2022-08-08 LAB — CORTISOL: Cortisol, Plasma: 28.6 ug/dL

## 2022-08-08 LAB — PROCALCITONIN: Procalcitonin: 49.23 ng/mL

## 2022-08-08 LAB — TROPONIN I (HIGH SENSITIVITY): Troponin I (High Sensitivity): 50 ng/L — ABNORMAL HIGH (ref <18)

## 2022-08-08 LAB — MRSA NEXT GEN BY PCR, NASAL: MRSA by PCR Next Gen: NOT DETECTED

## 2022-08-08 LAB — PREPARE RBC (CROSSMATCH)

## 2022-08-08 MED ORDER — SODIUM CHLORIDE 0.9 % IV SOLN
500.0000 mg | INTRAVENOUS | Status: DC
  Administered 2022-08-08 – 2022-08-09 (×2): 500 mg via INTRAVENOUS
  Filled 2022-08-08 (×2): qty 5

## 2022-08-08 MED ORDER — ORAL CARE MOUTH RINSE: 15.0000 mL | OROMUCOSAL | Status: DC | PRN

## 2022-08-08 MED ORDER — VANCOMYCIN HCL 1250 MG/250ML IV SOLN: 1250.0000 mg | INTRAVENOUS | Status: DC

## 2022-08-08 MED ORDER — INSULIN ASPART 100 UNIT/ML IJ SOLN
0.0000 [IU] | INTRAMUSCULAR | Status: DC
  Administered 2022-08-08: 5 [IU] via SUBCUTANEOUS

## 2022-08-08 MED ORDER — CALCIUM GLUCONATE-NACL 1-0.675 GM/50ML-% IV SOLN
1.0000 g | Freq: Once | INTRAVENOUS | Status: AC
  Administered 2022-08-08: 1000 mg via INTRAVENOUS
  Filled 2022-08-08: qty 50

## 2022-08-08 MED ORDER — ARFORMOTEROL TARTRATE 15 MCG/2ML IN NEBU
15.0000 ug | INHALATION_SOLUTION | Freq: Two times a day (BID) | RESPIRATORY_TRACT | Status: DC
  Administered 2022-08-08 – 2022-08-12 (×8): 15 ug via RESPIRATORY_TRACT
  Filled 2022-08-08 (×8): qty 2

## 2022-08-08 MED ORDER — SODIUM CHLORIDE 0.9 % IV BOLUS
500.0000 mL | Freq: Once | INTRAVENOUS | Status: AC
  Administered 2022-08-08: 500 mL via INTRAVENOUS

## 2022-08-08 MED ORDER — HYDROCORTISONE SOD SUC (PF) 100 MG IJ SOLR
100.0000 mg | Freq: Three times a day (TID) | INTRAMUSCULAR | Status: DC
  Administered 2022-08-08 – 2022-08-10 (×7): 100 mg via INTRAVENOUS
  Filled 2022-08-08 (×7): qty 2

## 2022-08-08 MED ORDER — VASOPRESSIN 20 UNITS/100 ML INFUSION FOR SHOCK
0.0000 [IU]/min | INTRAVENOUS | Status: DC
  Administered 2022-08-08 – 2022-08-09 (×5): 0.03 [IU]/min via INTRAVENOUS
  Filled 2022-08-08 (×4): qty 100

## 2022-08-08 MED ORDER — ORAL CARE MOUTH RINSE
15.0000 mL | OROMUCOSAL | Status: DC
  Administered 2022-08-08 – 2022-08-12 (×16): 15 mL via OROMUCOSAL

## 2022-08-08 MED ORDER — SODIUM CHLORIDE 0.9 % IV SOLN: INTRAVENOUS | Status: DC

## 2022-08-08 MED ORDER — SODIUM CHLORIDE 0.9 % IV SOLN
2.0000 g | INTRAVENOUS | Status: DC
  Administered 2022-08-08 – 2022-08-09 (×2): 2 g via INTRAVENOUS
  Filled 2022-08-08 (×2): qty 12.5

## 2022-08-08 MED ORDER — SODIUM CHLORIDE 0.9% IV SOLUTION: Freq: Once | INTRAVENOUS | Status: AC

## 2022-08-08 MED ORDER — ALBUMIN HUMAN 25 % IV SOLN
50.0000 g | Freq: Four times a day (QID) | INTRAVENOUS | Status: AC
  Administered 2022-08-08 (×2): 50 g via INTRAVENOUS
  Filled 2022-08-08 (×2): qty 200

## 2022-08-08 MED ORDER — REVEFENACIN 175 MCG/3ML IN SOLN
175.0000 ug | Freq: Every day | RESPIRATORY_TRACT | Status: DC
  Administered 2022-08-08 – 2022-08-12 (×5): 175 ug via RESPIRATORY_TRACT
  Filled 2022-08-08 (×5): qty 3

## 2022-08-08 MED ORDER — NOREPINEPHRINE 4 MG/250ML-% IV SOLN
0.0000 ug/min | INTRAVENOUS | Status: DC
  Administered 2022-08-08: 18 ug/min via INTRAVENOUS
  Administered 2022-08-08: 27 ug/min via INTRAVENOUS
  Administered 2022-08-08: 30 ug/min via INTRAVENOUS
  Administered 2022-08-08: 15 ug/min via INTRAVENOUS
  Administered 2022-08-08: 24 ug/min via INTRAVENOUS
  Administered 2022-08-08: 16 ug/min via INTRAVENOUS
  Administered 2022-08-08: 34 ug/min via INTRAVENOUS
  Administered 2022-08-09: 18 ug/min via INTRAVENOUS
  Administered 2022-08-09: 15 ug/min via INTRAVENOUS
  Administered 2022-08-09: 18 ug/min via INTRAVENOUS
  Administered 2022-08-09: 10 ug/min via INTRAVENOUS
  Filled 2022-08-08 (×3): qty 250
  Filled 2022-08-08: qty 500
  Filled 2022-08-08 (×5): qty 250

## 2022-08-08 MED ORDER — SODIUM CHLORIDE 0.9 % IV SOLN: 2.0000 g | Freq: Three times a day (TID) | INTRAVENOUS | Status: DC

## 2022-08-08 MED ORDER — SODIUM CHLORIDE 0.9 % IV BOLUS
1000.0000 mL | Freq: Once | INTRAVENOUS | Status: AC
  Administered 2022-08-08: 1000 mL via INTRAVENOUS

## 2022-08-08 MED ORDER — INSULIN GLARGINE-YFGN 100 UNIT/ML ~~LOC~~ SOLN
5.0000 [IU] | Freq: Every day | SUBCUTANEOUS | Status: DC
  Administered 2022-08-08 – 2022-08-12 (×5): 5 [IU] via SUBCUTANEOUS
  Filled 2022-08-08 (×5): qty 0.05

## 2022-08-08 MED ORDER — SODIUM CHLORIDE 0.9% IV SOLUTION: Freq: Once | INTRAVENOUS | Status: DC

## 2022-08-08 MED ORDER — SODIUM CHLORIDE 0.9 % IV SOLN
250.0000 mL | INTRAVENOUS | Status: DC
  Administered 2022-08-08: 250 mL via INTRAVENOUS

## 2022-08-08 MED ORDER — SODIUM CHLORIDE 0.9 % IV BOLUS
1000.0000 mL | INTRAVENOUS | Status: AC
  Administered 2022-08-08: 1000 mL via INTRAVENOUS

## 2022-08-08 MED ORDER — NOREPINEPHRINE 4 MG/250ML-% IV SOLN
2.0000 ug/min | INTRAVENOUS | Status: DC
  Administered 2022-08-08: 2 ug/min via INTRAVENOUS
  Filled 2022-08-08: qty 250

## 2022-08-08 MED ORDER — INSULIN ASPART 100 UNIT/ML IJ SOLN
0.0000 [IU] | INTRAMUSCULAR | Status: DC
  Administered 2022-08-08: 11 [IU] via SUBCUTANEOUS
  Administered 2022-08-08: 7 [IU] via SUBCUTANEOUS
  Administered 2022-08-08: 20 [IU] via SUBCUTANEOUS
  Administered 2022-08-08: 7 [IU] via SUBCUTANEOUS
  Administered 2022-08-09 (×2): 3 [IU] via SUBCUTANEOUS
  Administered 2022-08-09: 4 [IU] via SUBCUTANEOUS
  Administered 2022-08-09 – 2022-08-10 (×4): 3 [IU] via SUBCUTANEOUS
  Administered 2022-08-10 (×2): 4 [IU] via SUBCUTANEOUS
  Administered 2022-08-10 – 2022-08-12 (×5): 3 [IU] via SUBCUTANEOUS
  Administered 2022-08-12: 4 [IU] via SUBCUTANEOUS
  Administered 2022-08-12 (×2): 3 [IU] via SUBCUTANEOUS

## 2022-08-08 MED ORDER — VANCOMYCIN HCL 1750 MG/350ML IV SOLN
1750.0000 mg | Freq: Once | INTRAVENOUS | Status: AC
  Administered 2022-08-08: 1750 mg via INTRAVENOUS
  Filled 2022-08-08: qty 350

## 2022-08-08 MED ORDER — CHLORHEXIDINE GLUCONATE CLOTH 2 % EX PADS
6.0000 | MEDICATED_PAD | Freq: Every day | CUTANEOUS | Status: DC
  Administered 2022-08-08 – 2022-08-14 (×6): 6 via TOPICAL

## 2022-08-08 MED ORDER — SODIUM CHLORIDE 0.9 % IV SOLN
2.0000 g | INTRAVENOUS | Status: DC
  Administered 2022-08-08: 2 g via INTRAVENOUS
  Filled 2022-08-08: qty 20

## 2022-08-08 NOTE — TOC Initial Note (Signed)
Transition of Care [facility]) - Initial/Assessment Note    Patient Details  Name: Jonathon [name] MRN: [name] Date of Birth: Aug 08, 1935  Transition [facility]) CM/SW Contact:    [name] Dine, RN Phone Number: 08/08/2022, 11:59 AM  Clinical Narrative:                 Pt unresponsive; spoke w/ pt's dtr [name] [phone]); she says pt is from home and plans for him to return at d/c; she says pt has private duty nurse 5 days/week (8-1); TOC will follow for d/c needs.  Expected Discharge Plan: Home/Self Care Barriers to Discharge: Continued Medical Work up   Patient Goals and CMS Choice Patient states their goals for this hospitalization and ongoing recovery are:: home per pt's dtr [name]          Expected Discharge Plan and Services   Discharge Planning Services: CM Consult   Living arrangements for the past 2 months: Apartment                                      Prior Living Arrangements/Services Living arrangements for the past 2 months: Apartment Lives with:: Self Patient language and need for interpreter reviewed:: Yes Do you feel safe going back to the place where you live?: Yes      Need for Family Participation in Patient Care: Yes (Comment) Care giver support system in place?: Yes (comment) Current home services: Other (comment) (pt has private duty nurse 8a-1p 5 days/week) Criminal Activity/Legal Involvement Pertinent to Current Situation/Hospitalization: No - Comment as needed  Activities of Daily Living Home Assistive Devices/Equipment: Other (Comment) (indwelling foley catheter) ADL Screening (condition at time of admission) Patient's cognitive ability adequate to safely complete daily activities?: Yes Is the patient deaf or have difficulty hearing?: No Does the patient have difficulty seeing, even when wearing glasses/contacts?: No Does the patient have difficulty concentrating, remembering, or making decisions?: No Patient  able to express need for assistance with ADLs?: Yes Does the patient have difficulty dressing or bathing?: Yes Independently performs ADLs?: No Communication: Independent Dressing (OT): Needs assistance Is this a change from baseline?: Change from baseline, expected to last >3 days Grooming: Needs assistance Is this a change from baseline?: Change from baseline, expected to last >3 days Feeding: Independent Bathing: Dependent Is this a change from baseline?: Change from baseline, expected to last >3 days Toileting: Dependent Is this a change from baseline?: Change from baseline, expected to last >3days In/Out Bed: Dependent Is this a change from baseline?: Change from baseline, expected to last >3 days Walks in Home: Dependent Is this a change from baseline?: Change from baseline, expected to last >3 days Does the patient have difficulty walking or climbing stairs?: Yes Weakness of Legs: Both Weakness of Arms/Hands: Both  Permission Sought/Granted Permission sought to share information with : Case Manager Permission granted to share information with : Yes, Verbal Permission Granted  Share Information with NAME: Lenor Coffin, RN, CM     Permission granted to share info w Relationship: [name] (dtr) [phone]     Emotional Assessment Appearance:: Appears stated age Attitude/Demeanor/Rapport: Unable to Assess Affect (typically observed): Unable to Assess Orientation: :  (unable to assess) Alcohol / Substance Use: Not Applicable Psych Involvement: No (comment)  Admission diagnosis:  Weakness [R53.1] Patient Active Problem List   Diagnosis Date Noted   Weakness 08/07/2022   Cytopenia  due to immunosupressive agent 08/07/2022   Paroxysmal atrial fibrillation ([place]) 10/11/2021   Secondary hypercoagulable state ([place]) 10/11/2021   Counseling regarding advance care planning and goals of care 04/29/2018   Multiple myeloma ([place]) 04/29/2018   Malignant neoplasm metastatic to  bone ([place]) 04/14/2018   Hypercalcemia 04/14/2018   Lytic bone lesions on xray 03/29/2018   Displaced fracture of lateral end of right clavicle with routine healing 03/29/2018   Fall 09/05/2017   Sepsis secondary to UTI ([place]) 09/05/2017   Urinary retention 09/05/2017   AKI (acute kidney injury) ([place]) 09/05/2017   CAD (coronary artery disease) 09/05/2017   Superficial vein thrombosis 08/19/2017   Altered mental status    Metabolic encephalopathy [id number]   Dementia ([place]) 08/04/2017   Seizure ([place]) 08/01/2017   Thiamine deficiency 05/27/2017   B12 deficiency 04/30/2017   Alcoholism ([place]) 01/28/2017   Acute encephalopathy 12/28/2016   Hepatic steatosis 06/05/2016   Physical deconditioning 06/05/2016   Chronic diastolic congestive heart failure ([place]) 06/05/2016   Orthostatic syncope    Elevated liver function tests    LOC (loss of consciousness) ([place]) 08/10/2014   CIDP (chronic inflammatory demyelinating polyneuropathy) ([place]) 08/08/2014   Sustained SVT ([place]) 07/27/2014   Polyradiculoneuropathy ([place]) 09/21/2013   Abnormal LFTs 08/06/2013   Iron deficiency anemia 04/19/2013   CKD (chronic kidney disease) stage 3, GFR 30-59 ml/min (HCC) 01/21/2013   HTN (hypertension) 01/21/2013   Muscle weakness 01/13/2013   External hemorrhoids 03/23/2011   Symptomatic bradycardia 10/25/2008   NEPHROLITHIASIS 10/25/2008   PACEMAKER, PERMANENT 10/25/2008   Essential hypertension 11/21/2006   BPH (benign prostatic hyperplasia) 11/21/2006   History of colonic polyps 11/21/2006   PCP:  [name], MD Pharmacy:   [id number] [place], [place] - [address] [place] [address] Phone: [phone] Fax: [phone]  [id number] [place], [place] - [address] [place] [address] Phone: [phone] Fax: [phone]  [facility] ([place]) [address], [address] [place]  [address] Phone: [phone] Fax: [phone]     Social Determinants of Health ([place]) Social History: SDOH Screenings   Food Insecurity: No Food Insecurity (08/08/2022)  Housing: Low Risk  (08/08/2022)  Transportation Needs: No Transportation Needs (08/08/2022)  Utilities: Not At Risk (08/08/2022)  Alcohol Screen: Low Risk  (02/07/2022)  Depression (PHQ2-9): High Risk (07/23/2022)  Financial Resource Strain: Low Risk  (02/07/2022)  Physical Activity: Sufficiently Active (02/07/2022)  Social Connections: Socially Isolated (02/07/2022)  Stress: No Stress Concern Present (02/07/2022)  Tobacco Use: Medium Risk (08/03/2022)   SDOH Interventions: Food Insecurity Interventions: Inpatient TOC Housing Interventions: Inpatient TOC Transportation Interventions: Inpatient TOC Utilities Interventions: Inpatient TOC   Readmission Risk Interventions     No data to display

## 2022-08-08 NOTE — Progress Notes (Signed)
Pharmacy Antibiotic Note  Jonathon [name] is a 87 y.o. male admitted on 08/07/2022 with weakness of unclear etiology. PMH significant for multiple myeloma with bone metastases, PA.  Patient hypotensive overnight and transferred to ICU.   Pharmacy has been consulted for Vancomycin and Cefepime dosing for febrile neutropenia.  Plan: Cefepime 2gm IV q24h Vancomycin 1750mg  IV x 1 followed by Vancomycin 1250 mg IV Q 48 hrs. Goal AUC 400-550. Expected AUC: 476.6  SCr used: 2.65 Follow renal function F/u culture results and sensitivities     Temp (24hrs), Avg:98.9 F (37.2 C), Min:97.5 F (36.4 C), Max:100.9 F (38.3 C)  Recent Labs  Lab 08/03/22 0235 08/07/22 1013 08/08/22 0334 08/08/22 0431  WBC 2.4* 2.0* 1.2*  --   CREATININE 1.44* 1.51* 2.58* 2.65*  LATICACIDVEN  --   --  4.1*  --     Estimated Creatinine Clearance: 21.4 mL/min (A) (by C-G formula based on SCr of 2.65 mg/dL (H)).    Allergies  Allergen Reactions   Lexapro [Escitalopram Oxalate]     3/30-08/16/17 ? Serotonin Syndrome    Antimicrobials this admission: 3/28 Ceftriaxone x 1 3/28 Cefepime >>   3/28 Vancomycin >>  Dose adjustments this admission:    Microbiology results: 3/27 BCx:   3/27 UCx:    3/28 MRSA PCR:    Thank you for allowing pharmacy to be a part of this patient's care.  Everette Rank, PharmD 08/08/2022 5:25 AM

## 2022-08-08 NOTE — Progress Notes (Signed)
An USGPIV (ultrasound guided PIV) has been placed for short-term vasopressor infusion. A correctly placed ivWatch must be used when administering Vasopressors. Should this treatment be needed beyond 72 hours, central line access should be obtained.  It will be the responsibility of the bedside nurse to follow best practice to prevent extravasations.   ?

## 2022-08-08 NOTE — Consult Note (Addendum)
NAME:  Jonathon [name], MRN:  [name], DOB:  [birth year], LOS: 0 ADMISSION DATE:  08/06/2022, CONSULTATION DATE:  08/08/2022 REFERRING MD:  Mirna Mires, [place], CHIEF COMPLAINT:  hypotension.  History of Present Illness:  87 yo male former smoker with relapsed multiple myeloma with bone and lung metastases treated with Isatuximab/Pomolidimide/Dexamethasone was sent from cancer center with weakness and cytopenias.  He developed hypotension, worsening mental status, Tm 100.9 and progressive decrease in cytopenias.  Transferred to ICU.  Started on IV fluid, pressors, ABx with plan for PRBC transfusion.  Procalcitonin elevated.  PCCM consulted to assist with management in ICU.  Pertinent  Medical History  Asthma, BPH, HFpEF, CIDP, HTN, Nephrolithiasis, SVT, s/p PPM, PAF on eliquis, Neuropathy  Significant Hospital Events: Including procedures, antibiotic start and stop dates in addition to other pertinent events   3/27 admit 3/28 transfer to ICU; start ABx, IV fluids, pressors  Interim History / Subjective:  Pt unable to provide history.  Objective   BP (!) 103/51   Pulse (!) 113   Temp (!) 100.9 F (38.3 C) (Axillary)   Resp 19   SpO2 100%       Intake/Output Summary (Last 24 hours) at 08/08/2022 0458 Last data filed at 08/05/2022 1835 Gross per 24 hour  Intake --  Output 400 ml  Net -400 ml   There were no vitals filed for this visit.  Examination:  General - somnolent, moaning Eyes - pupils reactive ENT - dry mucosa Cardiac - regular rate/rhythm, no murmur Chest - decreased BS, scattered rhonchi Abdomen - soft, non tender, decreased bowel sounds Extremities - decreased muscle bulk Skin - areas of ecchymosis Neuro - not following commands  Resolved Hospital Problem list     Assessment & Plan:   Hypotension in setting of chemotherapy induced and cancer related neutropenic fever with elevated procalcitonin from sepsis. - continue IV fluids - pressors to keep MAP > 65 - day 1  of vancomycin, cefepime - f/u culture results - defer CVL placement for now in setting of severe thrombocytopenia - was getting decadron with chemotherapy >> check cortisol  Altered mental status in setting of sepsis. - CT head to assess for CVA or ICH in setting of severe thrombocytopenia and anticoagulation with eliquis - check ABG - hold trazodone, tramadol  Pancytopenia from chemotherapy and multiple myeloma. - PRBC transfusion pending - transfuse 1 unit PLT - f/u CBC with diff  AKI from ATN in setting of sepsis. Non-gap metabolic acidosis. Hyperkalemia. BPH. - baseline creatinine 1.51 from 08/03/22 - check renal u/s given hx of nephrolithiasis - continue IV fluids, optimize hemodynamics - f/u BMET, monitor urine outpt - hold outpt flomax  Hx of PAF, HFpEF. - monitor on telemetry - hold outpt eliquis, diltiazem, flecainide, lasix, avapro, lopressor  Best Practice (right click and "Reselect all SmartList Selections" daily)   Diet/type: NPO DVT prophylaxis: SCD GI prophylaxis: N/A Lines: N/A Foley:  N/A Code Status:  full code Last date of multidisciplinary goals of care discussion [updated his daughter [name] over the phone]  Labs   CBC: Recent Labs  Lab 08/03/22 0235 08/05/2022 1013 08/08/22 0334  WBC 2.4* 2.0* 1.2*  NEUTROABS 2.2 1.8  --   HGB 8.8* 9.7* 5.7*  HCT 27.7* 28.8* 17.4*  MCV 88.8 87.0 86.6  PLT PLATELET CLUMPS NOTED ON SMEAR, UNABLE TO ESTIMATE 21* 15*    Basic Metabolic Panel: Recent Labs  Lab 08/03/22 0235 07/30/2022 1013 08/08/22 0334 08/08/22 0431  NA 136 136 133* 134*  K 4.4 5.0 5.2* 5.3*  CL 111 102 103 105  CO2 17* 21* 19* 17*  GLUCOSE 180* 307* 260* 240*  BUN 70* 70* 76* 79*  CREATININE 1.44* 1.51* 2.58* 2.65*  CALCIUM 6.8* 8.6* 7.7* 7.5*  MG  --   --  2.0  --    GFR: Estimated Creatinine Clearance: 21.4 mL/min (A) (by C-G formula based on SCr of 2.65 mg/dL (H)). Recent Labs  Lab 08/03/22 0235 07/28/2022 1013 08/08/22 0334   WBC 2.4* 2.0* 1.2*  LATICACIDVEN  --   --  4.1*    Liver Function Tests: Recent Labs  Lab 08/10/2022 1013  AST 13*  ALT 27  ALKPHOS 52  BILITOT 0.6  PROT 6.2*  ALBUMIN 2.9*   No results for input(s): "LIPASE", "AMYLASE" in the last 168 hours. No results for input(s): "AMMONIA" in the last 168 hours.  ABG    Component Value Date/Time   PHART 7.5 (H) 08/08/2022 0335   PCO2ART 22 (L) 08/08/2022 0335   PO2ART 104 08/08/2022 0335   HCO3 17.1 (L) 08/08/2022 0335   TCO2 20 09/18/2014 1322   ACIDBASEDEF 3.6 (H) 08/08/2022 0335   O2SAT 96.7 08/08/2022 0335     Coagulation Profile: No results for input(s): "INR", "PROTIME" in the last 168 hours.  Cardiac Enzymes: No results for input(s): "CKTOTAL", "CKMB", "CKMBINDEX", "TROPONINI" in the last 168 hours.  HbA1C: Hgb A1c MFr Bld  Date/Time Value Ref Range Status  11/05/2013 09:08 AM 6.0 4.6 - 6.5 % Final    Comment:    Glycemic Control Guidelines for People with Diabetes:Non Diabetic:  <6%Goal of Therapy: <7%Additional Action Suggested:  >8%     CBG: Recent Labs  Lab 08/08/22 0307  GLUCAP 250*    Review of Systems:   Unable to obtain  Past Medical History:  He,  has a past medical history of Asthma, BENIGN PROSTATIC HYPERTROPHY, HX OF (10/25/2008), Bone metastases (04/14/2018), BRADYCARDIA (2005), Chronic diastolic congestive heart failure ([place]) (06/05/2016), CIDP (chronic inflammatory demyelinating polyneuropathy) ([place]), HYPERTENSION (11/21/2006), Iron deficiency anemia, unspecified (04/19/2013), Multiple myeloma ([place]) (04/29/2018), NEPHROLITHIASIS, HX OF (11/21/2006 and 10/15/2008), PACEMAKER, PERMANENT (2005), SVT (supraventricular tachycardia), and TOBACCO ABUSE (10/25/2008).   Surgical History:   Past Surgical History:  Procedure Laterality Date   COLONOSCOPY  2004,2009   PACEMAKER INSERTION  05/14/2003   PERMANENT PACEMAKER GENERATOR CHANGE N/A 06/19/2012   Procedure: PERMANENT PACEMAKER GENERATOR CHANGE;  Surgeon:  [name], MD; Medtronic Adaptic L dual-chamber pacemaker, serial [id number]     SKIN GRAFT Right 05/13/1960   wrist   TOOTH EXTRACTION  11/10/2020   3 teeth removed     Social History:   reports that he quit smoking about 12 years ago. His smoking use included cigarettes. He started smoking about 57 years ago. He has a 11.50 pack-year smoking history. He has never used smokeless tobacco. He reports current alcohol use of about 6.0 - 10.0 standard drinks of alcohol per week. He reports that he does not use drugs.   Family History:  His family history includes Breast cancer in his sister and sister; Diabetes in his daughter and son; Diabetes type II in his brother; Heart attack in his father; Kidney disease in his father; Ovarian cancer in his daughter; Parkinson's disease in his mother. There is no history of Colon cancer, Rectal cancer, or Stomach cancer.   Allergies Allergies  Allergen Reactions   Lexapro [Escitalopram Oxalate]     3/30-08/16/17 ? Serotonin Syndrome     Home Medications  Prior to Admission medications   Medication Sig Start Date End Date Taking? Authorizing Provider  acyclovir (ZOVIRAX) 400 MG tablet Take 1 tablet (400 mg total) by mouth 2 (two) times daily. 06/27/22  Yes Brunetta Genera, MD  apixaban (ELIQUIS) 5 MG TABS tablet Take 1 tablet (5 mg total) by mouth 2 (two) times daily. 06/17/22  Yes [name], MD  diltiazem [facility]) 240 MG 24 hr capsule Take 1 capsule (240 mg total) by mouth daily. 06/17/22  Yes [name], MD  flecainide (TAMBOCOR) 150 MG tablet Take 0.5 tablets (75 mg total) by mouth 2 (two) times daily. 06/17/22  Yes [name], MD  furosemide (LASIX) 40 MG tablet Take 1 tablet (40 mg total) by mouth daily. 06/17/22  Yes [name], MD  irbesartan (AVAPRO) 300 MG tablet Take 1 tablet (300 mg total) by mouth daily. Take 300 mg by mouth daily. 06/24/22  Yes [name], MD  metoprolol tartrate (LOPRESSOR) 50 MG tablet Take 1  tablet (50 mg total) by mouth 2 (two) times daily. 06/17/22  Yes [name], MD  ondansetron (ZOFRAN) 8 MG tablet Take 1 tablet (8 mg total) by mouth every 8 (eight) hours as needed for nausea or vomiting. 06/27/22  Yes Brunetta Genera, MD  polyethylene glycol (MIRALAX / GLYCOLAX) 17 g packet Take 17 g by mouth daily. 06/24/22  Yes [name], MD  pomalidomide (POMALYST) 2 MG capsule Take 1 capsule (2 mg total) by mouth daily. Take 21 days on, 7 days off, repeat every 28 days. Vander [id number]; Date Obtained: 07/08/22 07/30/22  Yes Brunetta Genera, MD  prochlorperazine (COMPAZINE) 10 MG tablet Take 1 tablet (10 mg total) by mouth every 6 (six) hours as needed for nausea or vomiting. 06/27/22  Yes Brunetta Genera, MD  senna-docusate (SENOKOT-S) 8.6-50 MG tablet Take 2 tablets by mouth 2 (two) times daily as needed for mild constipation. 09/30/21  Yes Gareth Morgan, MD  tamsulosin (FLOMAX) 0.4 MG CAPS capsule Take 2 capsules (0.8 mg total) by mouth daily. 06/24/22  Yes [name], MD  traMADol (ULTRAM) 50 MG tablet Take 1 tablet (50 mg total) by mouth every 6 (six) hours as needed. 06/17/22  Yes Brunetta Genera, MD  colchicine 0.6 MG tablet Take 2 tabs (1.2 mg) now.  Then take 1 tab (0.6 mg) 1 hour later.  Then take 1 tab daily until gout flare resolves. Patient not taking: Reported on 08/11/2022 11/21/21   [name], MD  dexamethasone (DECADRON) 4 MG tablet Take 5 tablets (20mg ) every 2 weeks on days 8 and 22 starting cycle 2. Repeat every 28 days. 06/27/22   Brunetta Genera, MD  dexamethasone (DECADRON) 4 MG tablet Take one tablet by mouth twice daily until otherwise instructed by RADIATION providers Patient not taking: Reported on 07/21/2022 07/11/22   [name], PA-C     Critical care time: 42 minutes  Chesley Mires, MD [place] Pager - [phone] or [phone] 08/08/2022, 5:32 AM

## 2022-08-08 NOTE — Procedures (Signed)
Central Venous Catheter Insertion Procedure Note  CETH MULLADY  [id number]  07-Feb-1936  Date:08/08/22  Time:8:21 AM   Provider Performing:Caitlinn Klinker Alfredo Martinez   Procedure: Insertion of Non-tunneled Central Venous Catheter(36556) with US guidance [id number])   Indication(s) Medication administration  Consent Unable to obtain consent due to emergent nature of procedure.  Anesthesia Topical only with 1% lidocaine   Timeout Verified patient identification, verified procedure, site/side was marked, verified correct patient position, special equipment/implants available, medications/allergies/relevant history reviewed, required imaging and test results available.  Sterile Technique Maximal sterile technique including full sterile barrier drape, hand hygiene, sterile gown, sterile gloves, mask, hair covering, sterile ultrasound probe cover (if used).  Procedure Description Area of catheter insertion was cleaned with chlorhexidine and draped in sterile fashion.  With real-time ultrasound guidance a central venous catheter was placed into the left internal jugular vein. Nonpulsatile blood flow and easy flushing noted in all ports.  The catheter was sutured in place and sterile dressing applied.  Complications/Tolerance None; patient tolerated the procedure well. Chest X-ray is ordered to verify placement for internal jugular or subclavian cannulation.   Chest x-ray is not ordered for femoral cannulation.  EBL Minimal  Specimen(s) None       Noe Gens, MSN, APRN, NP-C, AGACNP-BC [facility] 08/08/2022, 8:21 AM   Please see [email or web address] for pager details.   From 7A-7P if no response, please call [phone] After hours, please call ELink [phone]

## 2022-08-08 NOTE — Progress Notes (Signed)
PHARMACY - PHYSICIAN COMMUNICATION CRITICAL VALUE ALERT - BLOOD CULTURE IDENTIFICATION (BCID)  Jonathon [name] is an 87 y.o. male who presented to [facility] on 08/07/2022 with a chief complaint of weakness, febrile neutropenia  Assessment:  2/2 (2/4 bottles) growing P aeruginosa (no clear source although pt currently neutropenic)  Name of physician (or Provider) Contacted: [place]  Current antibiotics: Vanc, cefepime, azithro  Changes to prescribed antibiotics recommended: Stop vanc +/- azithro - MD agrees to stop vanc; will continue azithro pending legionella UAg results.  Results for orders placed or performed during the hospital encounter of 08/07/22  Blood Culture ID Panel (Reflexed) (Collected: 08/07/2022  5:51 PM)  Result Value Ref Range   Enterococcus faecalis NOT DETECTED NOT DETECTED   Enterococcus Faecium NOT DETECTED NOT DETECTED   Listeria monocytogenes NOT DETECTED NOT DETECTED   Staphylococcus species NOT DETECTED NOT DETECTED   Staphylococcus aureus (BCID) NOT DETECTED NOT DETECTED   Staphylococcus epidermidis NOT DETECTED NOT DETECTED   Staphylococcus lugdunensis NOT DETECTED NOT DETECTED   Streptococcus species NOT DETECTED NOT DETECTED   Streptococcus agalactiae NOT DETECTED NOT DETECTED   Streptococcus pneumoniae NOT DETECTED NOT DETECTED   Streptococcus pyogenes NOT DETECTED NOT DETECTED   A.calcoaceticus-baumannii NOT DETECTED NOT DETECTED   Bacteroides fragilis NOT DETECTED NOT DETECTED   Enterobacterales NOT DETECTED NOT DETECTED   Enterobacter cloacae complex NOT DETECTED NOT DETECTED   Escherichia coli NOT DETECTED NOT DETECTED   Klebsiella aerogenes NOT DETECTED NOT DETECTED   Klebsiella oxytoca NOT DETECTED NOT DETECTED   Klebsiella pneumoniae NOT DETECTED NOT DETECTED   Proteus species NOT DETECTED NOT DETECTED   Salmonella species NOT DETECTED NOT DETECTED   Serratia marcescens NOT DETECTED NOT DETECTED   Haemophilus influenzae NOT DETECTED NOT DETECTED    Neisseria meningitidis NOT DETECTED NOT DETECTED   Pseudomonas aeruginosa DETECTED (A) NOT DETECTED   Stenotrophomonas maltophilia NOT DETECTED NOT DETECTED   Candida albicans NOT DETECTED NOT DETECTED   Candida auris NOT DETECTED NOT DETECTED   Candida glabrata NOT DETECTED NOT DETECTED   Candida krusei NOT DETECTED NOT DETECTED   Candida parapsilosis NOT DETECTED NOT DETECTED   Candida tropicalis NOT DETECTED NOT DETECTED   Cryptococcus neoformans/gattii NOT DETECTED NOT DETECTED   CTX-M ESBL NOT DETECTED NOT DETECTED   Carbapenem resistance IMP NOT DETECTED NOT DETECTED   Carbapenem resistance KPC NOT DETECTED NOT DETECTED   Carbapenem resistance NDM NOT DETECTED NOT DETECTED   Carbapenem resistance VIM NOT DETECTED NOT DETECTED    Kitai Purdom A 08/08/2022  6:55 PM

## 2022-08-08 NOTE — Consult Note (Signed)
[place] Nurse Consult Note: Reason for Consult: Consult requested for bilat buttocks.  Pt is critically ill in ICU and on a low airloss mattress to decrease pressure. The wounds are located in close proximity to the rectum and the patient is frequently incontinent of loose stools.  It will be difficult to prevent the location from becoming soiled.  Wound type: Left and right middle buttocks each with a Stage 3 pressure injury; 2X2X.2cm, red with mod amt bloody drainage.  Pressure Injury POA: Yes Dressing procedure/placement/frequency: Topical treatment orders provided for bedside nurses to perform as follows: Cut piece of Aquacel and apply to left and right buttock wounds Q day, then cover with foam dressing.  Change foam dressing Q 3 days or PRN soiling.  Apply sacrum dressing upside down to attempt to avoid close proximity to rectum.  Please re-consult if further assistance is needed.  Thank-you,  Julien Girt MSN, [place], [place], [place], [place]

## 2022-08-08 NOTE — Significant Event (Signed)
Rapid Response Event Note   Reason for Call :  Unresponsive and hypotensive.  Initial Focused Assessment:  Patient found by bedside RN unresponsive at 0302. Upon my arrival, still unable to elicit any response to pain or other stimuli. BP 60-80/40's verified in both arms. Axillary temperature recorded at 100.6 F. Last known well time was 2130 on 08/07/22.  Interventions:  Focused assessment, CBG & EKG done. Labs drawn: CBC, ABG, lactic acid, troponin, BMP, TSH, magnesium, and procalcitonin. 592ml NS IV fluid bolus given.  Plan of Care:  Moved to [place] room 1232, Raenette Rover, NP ordered Levophed and consulted critical care. Orders also for type/screen and transfusion due to low hemoglobin. CT head ordered and patient to be taken when BP stabilized. Second 57ml bolus given after arriving to 1232. Two new ultrasound guided IV's placed by IV team and Levophed started. Unable to reach family at time of transfer, provider left message to call.  Event Summary:   MD Notified: Raenette Rover, NP Call Time: [place] Time: [place] End Time: [place], RN

## 2022-08-08 NOTE — Progress Notes (Addendum)
    Patient Name: OCONNOR ERKER           DOB: 03-01-1936  MRN: [name]      Admission Date: 08/01/2022  Attending Provider: Lucillie Garfinkel, MD  Primary Diagnosis: Weakness   Level of care: Stepdown    CROSS COVER NOTE   Date of Service   08/08/2022   DHAIRYA BODART, 87 y.o. male, was admitted on 07/15/2022 for Weakness.    HPI/Events of Note   Notified by bedside RN of unresponsive patient with BP 60/30.  Rapid response called.  Patient to be transferred to stepdown unit.   12- lead ekg-V paced, HR 80-110.  CBG 250.   Despite 1 L bolus, BP sustaining 70 / 30s and is minimally responsive to painful stimulation.  Although there is no obvious bleeding noted, hemoglobin has dropped significantly.  Hemoglobin 9.7--> 5.7 now.  2 units PRBC ordered to be transfused.  In the meanwhile, patient has been started on Levophed to help stabilize BP.  Spoke with critical care (Dr. Halford Chessman), recommendation was made for additional fluid and antibiotic coverage for pneumonia. Also agrees with CT head imaging.  Lactic acid 4.1. blood culture already collected. UA negative.   ABG-pending CT head-pending Troponin, TSH, and procalcitonin-pending    I attempted to call daughter [name] to update regarding these changes, however I was unsuccessful in reaching her. Nursing staff to continue reaching reaching out to family.  Interventions/ Plan   Above        Raenette Rover, DNP, [place]

## 2022-08-08 NOTE — Progress Notes (Signed)
Blood cultures growing pseudomonas. Ok to stop vancomycin as MRSA pcr is negative. Continue cefepime and azithro. Can stop azithro once legionella urine ag returns.  Freda Jackson, MD [facility] Office: [phone]   See Amion for personal pager PCCM on call pager [phone] until 7pm. Please call Elink 7p-7a. [phone]

## 2022-08-08 NOTE — Progress Notes (Addendum)
[place] Progress Note Patient Name: Jonathon [name] DOB: 06/10/1935 MRN: [name]   Date of Service  08/08/2022  HPI/Events of Note  Patient admitted with general debility of unclear etiology. He was transferred to the ICU from the floor earlier tonight secondary to marked hypotension, increased lactic acid (4.1), altered mental status, and a significant drop in his hemoglobin.  eICU Interventions  New Patient Evaluation.        Frederik Pear 08/08/2022, 4:47 AM

## 2022-08-08 NOTE — Progress Notes (Addendum)
NAME:  STACY SORBY, MRN:  [name], DOB:  Feb 18, 1936, LOS: 0 ADMISSION DATE:  07/23/2022, CONSULTATION DATE:  08/08/2022 REFERRING MD:  Mirna Mires, [facility], CHIEF COMPLAINT:  hypotension.  History of Present Illness:  87 y/o male former smoker with relapsed multiple myeloma with bone and lung metastases treated with Isatuximab/Pomolidimide/Dexamethasone was sent from cancer center with weakness and cytopenias.  He developed hypotension, worsening mental status, Tmax 100.9 and progressive decrease in cytopenias.  Transferred to ICU.  Started on IV fluid, pressors, ABx with plan for PRBC transfusion.  Procalcitonin elevated.  PCCM consulted to assist with management in ICU.  Pertinent  Medical History  Asthma, BPH, HFpEF, CIDP, HTN, Nephrolithiasis, SVT, s/p PPM, PAF on eliquis, Neuropathy  Significant Hospital Events: Including procedures, antibiotic start and stop dates in addition to other pertinent events   3/27 admit 3/28 transfer to ICU; start ABx, IV fluids, pressors  Interim History / Subjective:  Tmax 100.9  RN reports hypotension despite vasopressors peripherally  On [place] O2 2L 100% I/O 400 ml UOP since admit, +2.4L   Objective   BP (!) 84/48   Pulse (!) 106   Temp (!) 100.9 F (38.3 C) (Axillary)   Resp 20   SpO2 100%       Intake/Output Summary (Last 24 hours) at 08/08/2022 0720 Last data filed at 08/08/2022 0655 Gross per 24 hour  Intake 2832.59 ml  Output 400 ml  Net 2432.59 ml   There were no vitals filed for this visit.  Examination: General: chronically ill appearing adult male lying in bed, acutely ill / hypotensive  HEENT: MM pink/moist, [place] O2, large central forehead soft tissue lesion Neuro: obtunded, no response to verbal stimuli CV: s1s2 paced, no m/r/g PULM: non-labored at rest, lungs bilaterally clear with good air entry GI: soft, bsx4 active  Extremities: warm/dry, trace edema  Skin: LE's with hyperpigmented lesions, right forearm with circular lesion  dark brown in coloring   BCx2 3/27 >  UC 3/27 >  Resolved Hospital Problem list     Assessment & Plan:   Hypotension in setting of chemotherapy induced and cancer related neutropenic fever with elevated procalcitonin from sepsis. -NS at 158ml/hr  -place central line emergently for vascular access given hypotension  -vasopressin + levophed for MAP>65  -cefepime, vancomycin D1/x  -follow blood, urine cultures  -continue stress dose steroids given prior decadron usage (cortisol 28)  Acute Metabolic Encephalopathy / Altered mental status in setting of sepsis, AKI CIDP -CT head to rule out CVA given severe thrombocytopenia & anticoagulation with eliquis when patient hemodynamically stable to travel -hold home trazodone, tramadol  -minimum sedating medications   Pancytopenia from chemotherapy and multiple myeloma. Difficulty obtaining PRBC given antibodies  -transfusion of PRBC, platelets -follow CBC  AKI from ATN in setting of sepsis. Non-gap metabolic acidosis. Hyperkalemia. BPH. Baseline creatinine 1.51 from 08/03/22. Renal US negative for hydronephrosis -Trend BMP / urinary output -Replace electrolytes as indicated -Avoid nephrotoxic agents, ensure adequate renal perfusion -maximize hemodynamics -hold outpatient flomax  Hx of PAF, HFpEF. -tele monitoring  -hold home eliquis, diltiazem, flecainide, lasix, avapro, lopressor  Hyperglycemia  -add levemir 5 units QD  -change to resistant SSI   Best Practice (right click and "Reselect all SmartList Selections" daily)  Diet/type: NPO DVT prophylaxis: SCD GI prophylaxis: N/A Lines: N/A Foley:  N/A Code Status:  full code Last date of multidisciplinary goals of care discussion: Daughter, [name], updated via phone per Dr. Erin Fulling  Critical care time: additional 35 minutes  Noe Gens, MSN, APRN, NP-C, AGACNP-BC  [facility] 08/08/2022, 7:22 AM   Please see [email or web address] for pager details.   From  7A-7P if no response, please call [phone] After hours, please call ELink [phone]

## 2022-08-08 NOTE — Progress Notes (Signed)
NS 500 cc bolus ordered, RRT & NP at bedside BP noted in VS, MEWs RED,  Pt transferred to ICU bed 1232,   08/08/22 0302  Assess: MEWS Score  Level of Consciousness Unresponsive  Assess: MEWS Score  MEWS Temp 0  MEWS Systolic 3  MEWS Pulse 0  MEWS RR 0  MEWS LOC 3  MEWS Score 6  MEWS Score Color Red  Assess: if the MEWS score is Yellow or Red  Were vital signs taken at a resting state? Yes  Focused Assessment Change from prior assessment (see assessment flowsheet)  Does the patient meet 2 or more of the SIRS criteria? No  MEWS guidelines implemented  Yes, yellow  Treat  MEWS Interventions Considered administering scheduled or prn medications/treatments as ordered  Take Vital Signs  Increase Vital Sign Frequency  Yellow: Q2hr x1, continue Q4hrs until patient remains green for 12hrs  Escalate  MEWS: Escalate Yellow: Discuss with charge nurse and consider notifying provider and/or RRT  Notify: Charge Nurse/RN  Name of Charge Nurse/RN Notified Renita, RN  Provider Notification  Provider Name/Title Raenette Rover, NP  Date Provider Notified 08/08/22  Time Provider Notified 0302  Method of Notification Page (secure chat)  Notification Reason Change in status  Provider response En route;See new orders  Date of Provider Response 08/08/22  Time of Provider Response 0302  Notify: Rapid Response  Name of Rapid Response RN Notified Mandy, RN  Date Rapid Response Notified 08/08/22  Time Rapid Response Notified [id number]

## 2022-08-09 ENCOUNTER — Encounter (HOSPITAL_COMMUNITY): Payer: Self-pay | Admitting: Pulmonary Disease

## 2022-08-09 ENCOUNTER — Inpatient Hospital Stay (HOSPITAL_COMMUNITY): Payer: Medicare Other

## 2022-08-09 DIAGNOSIS — Z79899 Other long term (current) drug therapy: Secondary | ICD-10-CM

## 2022-08-09 DIAGNOSIS — Z515 Encounter for palliative care: Secondary | ICD-10-CM

## 2022-08-09 DIAGNOSIS — A419 Sepsis, unspecified organism: Secondary | ICD-10-CM | POA: Diagnosis not present

## 2022-08-09 DIAGNOSIS — Z7189 Other specified counseling: Secondary | ICD-10-CM

## 2022-08-09 DIAGNOSIS — D759 Disease of blood and blood-forming organs, unspecified: Secondary | ICD-10-CM

## 2022-08-09 DIAGNOSIS — G893 Neoplasm related pain (acute) (chronic): Secondary | ICD-10-CM

## 2022-08-09 DIAGNOSIS — R4589 Other symptoms and signs involving emotional state: Secondary | ICD-10-CM

## 2022-08-09 DIAGNOSIS — R531 Weakness: Secondary | ICD-10-CM | POA: Diagnosis not present

## 2022-08-09 DIAGNOSIS — Z66 Do not resuscitate: Secondary | ICD-10-CM

## 2022-08-09 DIAGNOSIS — M899 Disorder of bone, unspecified: Secondary | ICD-10-CM | POA: Diagnosis not present

## 2022-08-09 DIAGNOSIS — C7951 Secondary malignant neoplasm of bone: Secondary | ICD-10-CM

## 2022-08-09 DIAGNOSIS — T451X5A Adverse effect of antineoplastic and immunosuppressive drugs, initial encounter: Secondary | ICD-10-CM

## 2022-08-09 DIAGNOSIS — C9002 Multiple myeloma in relapse: Secondary | ICD-10-CM

## 2022-08-09 DIAGNOSIS — R6521 Severe sepsis with septic shock: Secondary | ICD-10-CM | POA: Diagnosis not present

## 2022-08-09 LAB — CBC WITH DIFFERENTIAL/PLATELET
Abs Immature Granulocytes: 0.17 10*3/uL — ABNORMAL HIGH (ref 0.00–0.07)
Basophils Absolute: 0 10*3/uL (ref 0.0–0.1)
Basophils Relative: 3 %
Eosinophils Absolute: 0 10*3/uL (ref 0.0–0.5)
Eosinophils Relative: 0 %
HCT: 27 % — ABNORMAL LOW (ref 39.0–52.0)
Hemoglobin: 8.9 g/dL — ABNORMAL LOW (ref 13.0–17.0)
Immature Granulocytes: 46 %
Lymphocytes Relative: 3 %
Lymphs Abs: 0 10*3/uL — ABNORMAL LOW (ref 0.7–4.0)
MCH: 28.4 pg (ref 26.0–34.0)
MCHC: 33 g/dL (ref 30.0–36.0)
MCV: 86.3 fL (ref 80.0–100.0)
Monocytes Absolute: 0 10*3/uL — ABNORMAL LOW (ref 0.1–1.0)
Monocytes Relative: 6 %
Neutro Abs: 0.2 10*3/uL — CL (ref 1.7–7.7)
Neutrophils Relative %: 42 %
Platelets: 14 10*3/uL — CL (ref 150–400)
RBC: 3.13 MIL/uL — ABNORMAL LOW (ref 4.22–5.81)
RDW: 18.9 % — ABNORMAL HIGH (ref 11.5–15.5)
WBC: 0.4 10*3/uL — CL (ref 4.0–10.5)
nRBC: 11.1 % — ABNORMAL HIGH (ref 0.0–0.2)

## 2022-08-09 LAB — PREPARE PLATELET PHERESIS: Unit division: 0

## 2022-08-09 LAB — URINALYSIS, ROUTINE W REFLEX MICROSCOPIC
Bilirubin Urine: NEGATIVE
Glucose, UA: 50 mg/dL — AB
Ketones, ur: NEGATIVE mg/dL
Leukocytes,Ua: NEGATIVE
Nitrite: NEGATIVE
Protein, ur: 100 mg/dL — AB
RBC / HPF: >50 RBC/hpf (ref 0–5)
Specific Gravity, Urine: 1.013 (ref 1.005–1.030)
pH: 5 (ref 5.0–8.0)

## 2022-08-09 LAB — COMPREHENSIVE METABOLIC PANEL
ALT: 47 U/L — ABNORMAL HIGH (ref 0–44)
AST: 37 U/L (ref 15–41)
Albumin: 3 g/dL — ABNORMAL LOW (ref 3.5–5.0)
Alkaline Phosphatase: 51 U/L (ref 38–126)
Anion gap: 11 (ref 5–15)
BUN: 49 mg/dL — ABNORMAL HIGH (ref 8–23)
CO2: 12 mmol/L — ABNORMAL LOW (ref 22–32)
Calcium: 6.9 mg/dL — ABNORMAL LOW (ref 8.9–10.3)
Chloride: 110 mmol/L (ref 98–111)
Creatinine, Ser: 1.55 mg/dL — ABNORMAL HIGH (ref 0.61–1.24)
GFR, Estimated: 44 mL/min — ABNORMAL LOW (ref >60)
Glucose, Bld: 143 mg/dL — ABNORMAL HIGH (ref 70–99)
Potassium: 4.4 mmol/L (ref 3.5–5.1)
Sodium: 133 mmol/L — ABNORMAL LOW (ref 135–145)
Total Bilirubin: 1.5 mg/dL — ABNORMAL HIGH (ref 0.3–1.2)
Total Protein: 5.5 g/dL — ABNORMAL LOW (ref 6.5–8.1)

## 2022-08-09 LAB — BPAM PLATELET PHERESIS
Blood Product Expiration Date: 202403302359
ISSUE DATE / TIME: 202403280736
Unit Type and Rh: 5100

## 2022-08-09 LAB — BLOOD GAS, VENOUS
Acid-base deficit: 9.6 mmol/L — ABNORMAL HIGH (ref 0.0–2.0)
Bicarbonate: 14.9 mmol/L — ABNORMAL LOW (ref 20.0–28.0)
O2 Saturation: 70.9 %
Patient temperature: 37
pCO2, Ven: 27 mmHg — ABNORMAL LOW (ref 44–60)
pH, Ven: 7.35 (ref 7.25–7.43)
pO2, Ven: 41 mmHg (ref 32–45)

## 2022-08-09 LAB — PHOSPHORUS: Phosphorus: 3.7 mg/dL (ref 2.5–4.6)

## 2022-08-09 LAB — LACTIC ACID, PLASMA
Lactic Acid, Venous: 4.2 mmol/L (ref 0.5–1.9)
Lactic Acid, Venous: 2.8 mmol/L (ref 0.5–1.9)

## 2022-08-09 LAB — MAGNESIUM: Magnesium: 1.8 mg/dL (ref 1.7–2.4)

## 2022-08-09 LAB — GLUCOSE, CAPILLARY
Glucose-Capillary: 138 mg/dL — ABNORMAL HIGH (ref 70–99)
Glucose-Capillary: 124 mg/dL — ABNORMAL HIGH (ref 70–99)
Glucose-Capillary: 180 mg/dL — ABNORMAL HIGH (ref 70–99)
Glucose-Capillary: 132 mg/dL — ABNORMAL HIGH (ref 70–99)
Glucose-Capillary: 142 mg/dL — ABNORMAL HIGH (ref 70–99)

## 2022-08-09 LAB — HEMOGLOBIN A1C
Hgb A1c MFr Bld: 7.8 % — ABNORMAL HIGH (ref 4.8–5.6)
Mean Plasma Glucose: 177 mg/dL

## 2022-08-09 MED ORDER — CIPROFLOXACIN IN D5W 400 MG/200ML IV SOLN
400.0000 mg | Freq: Two times a day (BID) | INTRAVENOUS | Status: DC
  Administered 2022-08-09: 400 mg via INTRAVENOUS
  Filled 2022-08-09: qty 200

## 2022-08-09 MED ORDER — FENTANYL CITRATE PF 50 MCG/ML IJ SOSY
12.5000 ug | PREFILLED_SYRINGE | INTRAMUSCULAR | Status: DC | PRN
  Administered 2022-08-09 – 2022-08-10 (×7): 25 ug via INTRAVENOUS
  Filled 2022-08-09 (×7): qty 1

## 2022-08-09 MED ORDER — ACETAMINOPHEN 650 MG RE SUPP
650.0000 mg | RECTAL | Status: DC | PRN
  Administered 2022-08-09: 650 mg via RECTAL
  Filled 2022-08-09: qty 1

## 2022-08-09 MED ORDER — SODIUM CHLORIDE 0.9 % IV SOLN
2.0000 g | Freq: Two times a day (BID) | INTRAVENOUS | Status: DC
  Administered 2022-08-09 – 2022-08-12 (×6): 2 g via INTRAVENOUS
  Filled 2022-08-09 (×6): qty 12.5

## 2022-08-09 NOTE — Consult Note (Signed)
Consultation Note Date: 08/09/2022   Patient Name: Jonathon [name]  DOB: 28-Apr-1936  MRN: [name]  Age / Sex: 87 y.o., male   PCP: [name], MD Referring Physician: Collene Gobble, MD  Reason for Consultation: Establishing goals of care     Chief Complaint/History of Present Illness:   Patient is an 87 year old male with a past medical history of tobacco use and relapsed multiple myeloma with metastatic disease to bone and lung who was admitted on 08/08/2022 from the cancer center for management of weakness and cytopenia.  Patient transferred to the ICU once developed worsening mental status, hypotension, and fever.  Since being admitted, patient has been receiving aggressive medical management for septic shock secondary to pseudomonal bacteremia with neutropenic fever.  Patient also receiving support for pancytopenia in the setting of chemotherapy and multiple myeloma.  Patient also receiving support for AKI from ATN in the setting of sepsis.  Palliative medicine team consulted to assist with complex medical decision making.  Discussed care with PCCM provider prior to seeing patient.  Presented to bedside to meet with patient.  Patient laying in bed and will awaken to voice though appears uncomfortable and not fully conversant.  Patient's daughter and her friend were present at bedside.  Introduced myself and the role of the palliative medicine team.  Daughter, [name], was able to update me regarding patient's medical journey up into this point.  Daughter notes today is a "better day" as patient is more awake when yesterday he was unresponsive.  Spent time learning about patient's life outside of the hospital.  Daughter described patient is a fiercely independent man.  Noted his dignity was incredibly important to him.  Patient had been moved into assisted living though it was in a 2 bedroom area that allowed him more space and family to come and visit often. Patient had recently become so  weak that he was unable to walk though had previously been ambulating with a walker.  Described how patient tried to stay at this active as possible.  She noted the patient was very spiritual and loved to spend time in nature.  Patient's family is also spiritual and has multiple "prayer warriors" praying for the patient at this time.  Encouraged visit from spiritual leaders to assist with support during this time.  Spent time providing emotional support via active listening.  Able to discuss patient's medical care in light of knowing a little bit about his life outside of the hospital.  We discussed continuing with aggressive appropriate medical care at this time.  Also discussed that should patient be sick enough that his heart physically stops or he stops breathing, that would be God calling him home and would not medically recommend chest compressions or intubation.  Daughter acknowledged this and agreed that patient himself has stated that if it is his time, he is accepting of this.  She also stated that patient has clearly stated he never wanted to be kept alive on machines like ventilators.  Discussed change of CODE STATUS to DNR/DNI at this time, continue all appropriate medical care.  Daughter agreeing with change of CODE STATUS.  Acknowledged that care providers are hoping that patient will continue to improve and only time will tell.  During visit, patient was grimacing and ripping his fists in pain.  Patient was unable to express pinpoint pain location.  Daughter noted that patient has been having worsening pain in setting of his metastatic bone disease.  Discussed providing pain medication  at this time to assist in maintaining patient's dignity and for him not to "suffer".  Daughter agreeing with this plan.  All questions answered at that time.  Thanked daughter and patient for allowing me to meet with them today.  Noted palliative medicine team would continue to follow along.  Updated PCCM  provider and bedside RN after conversation.  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[place])  Urinary retention  Septic shock ([place])   Palliative Review of Systems: Grimacing at times, unable to pinpoint source of pain  Past Medical History:  Diagnosis Date   Asthma    BENIGN PROSTATIC HYPERTROPHY, HX OF 10/25/2008   Bone metastases 04/14/2018   BRADYCARDIA 2005   Chronic diastolic congestive heart failure ([place]) 06/05/2016   CIDP (chronic inflammatory demyelinating polyneuropathy) ([place])    HYPERTENSION 11/21/2006   Iron deficiency anemia, unspecified 04/19/2013   Multiple myeloma ([place]) 04/29/2018   NEPHROLITHIASIS, HX OF 11/21/2006 and 10/15/2008   PACEMAKER, PERMANENT 2005   Gen change 2014 Medtronic Adaptic L dual-chamber pacemaker, serial [id number]    SVT (supraventricular tachycardia)    TOBACCO ABUSE 10/25/2008   Quit 2012   Social History   Socioeconomic History   Marital status: Divorced    Spouse name: Not on file   Number of children: 4   Years of education: Not on file   Highest education level: Not on file  Occupational History   Occupation: ENVIROMENTAL SERVICE    Employer: [employer]   Occupation: retired  Tobacco Use   Smoking status: Former    Packs/day: 0.25    Years: 46.00    Additional pack years: 0.00    Total pack years: 11.50    Types: Cigarettes    Start date: 03/16/1965    Quit date: 06/15/2010    Years since quitting: 12.1   Smokeless tobacco: Never   Tobacco comments:    Former smoker 10/11/21 quit 3 years ago  Vaping Use   Vaping Use: Never  used  Substance and Sexual Activity   Alcohol use: Yes    Alcohol/week: 6.0 - 10.0 standard drinks of alcohol    Types: 6 - 10 Glasses of wine per week    Comment: 1-2 glasses every weekend 10/11/21   Drug use: No   Sexual activity: Not on file  Other Topics Concern   Not on file  Social History Narrative   Has relocated from [place] in 2007. 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  Patient in a facility thru [facility]   Social Determinants of Health   Financial Resource Strain: Low Risk  (02/07/2022)   Overall Financial Resource Strain (CARDIA)    Difficulty of Paying Living Expenses: Not hard at all  Food Insecurity: No Food Insecurity (08/08/2022)   Hunger Vital Sign    Worried About Running Out of Food in the Last Year: Never true    Ran Out of Food in the Last Year: Never true  Transportation Needs: No Transportation Needs (08/08/2022)   PRAPARE - Hydrologist (Medical): No    Lack of Transportation (Non-Medical): No  Physical Activity: Sufficiently Active (02/07/2022)   Exercise Vital Sign  Days of Exercise per Week: 7 days    Minutes of Exercise per Session: 50 min  Stress: No Stress Concern Present (02/07/2022)   [place]    Feeling of Stress : Not at all  Social Connections: Socially Isolated (02/07/2022)   Social Connection and Isolation Panel [NHANES]    Frequency of Communication with Friends and Family: More than three times a week    Frequency of Social Gatherings with Friends and Family: More than three times a week    Attends Religious Services: Never    Marine scientist or Organizations: No    Attends Music therapist: Never    Marital Status: Divorced   Family History  Problem Relation Age of Onset   Heart attack Father        Died, 23   Kidney disease Father    Parkinson's disease Mother        Died, 24   Breast cancer Sister        Living, 26   Diabetes type II  Brother        Living, 60   Breast cancer Sister        Living, 50   Diabetes Daughter        Living, 50   Diabetes Son        Living, 54   Ovarian cancer Daughter    Colon cancer Neg Hx    Rectal cancer Neg Hx    Stomach cancer Neg Hx    Scheduled Meds:  sodium chloride   Intravenous Once   arformoterol  15 mcg Nebulization BID   Chlorhexidine Gluconate Cloth  6 each Topical Daily   hydrocortisone sod succinate (SOLU-CORTEF) inj  100 mg Intravenous Q8H   insulin aspart  0-20 Units Subcutaneous Q4H   insulin glargine-yfgn  5 Units Subcutaneous Daily   mouth rinse  15 mL Mouth Rinse 4 times per day   revefenacin  175 mcg Nebulization Daily   Continuous Infusions:  sodium chloride 10 mL/hr at 08/09/22 0718   sodium chloride 125 mL/hr at 08/09/22 0756   azithromycin 500 mg (08/09/22 0759)   ceFEPime (MAXIPIME) IV     norepinephrine (LEVOPHED) Adult infusion 18 mcg/min (08/09/22 0755)   vasopressin 0.03 Units/min (08/09/22 0718)   PRN Meds:.acetaminophen **OR** acetaminophen, acetaminophen, [DISCONTINUED] ondansetron **OR** ondansetron (ZOFRAN) IV, mouth rinse Allergies  Allergen Reactions   Lexapro [Escitalopram Oxalate]     3/30-08/16/17 ? Serotonin Syndrome   CBC:    Component Value Date/Time   WBC 0.4 (LL) 08/09/2022 0354   HGB 8.9 (L) 08/09/2022 0354   HGB 9.7 (L) 07/29/2022 1013   HGB 9.7 (L) 09/15/2014 0810   HCT 27.0 (L) 08/09/2022 0354   HCT 28.8 (L) 09/15/2014 0810   PLT 14 (LL) 08/09/2022 0354   PLT 21 (L) 08/03/2022 1013   PLT 179 09/15/2014 0810   MCV 86.3 08/09/2022 0354   MCV 80 (L) 09/15/2014 0810   NEUTROABS 0.2 (LL) 08/09/2022 0354   NEUTROABS 6 09/09/2017 0000   NEUTROABS 1.5 09/15/2014 0810   LYMPHSABS 0.0 (L) 08/09/2022 0354   LYMPHSABS 1.1 09/15/2014 0810   MONOABS 0.0 (L) 08/09/2022 0354   EOSABS 0.0 08/09/2022 0354   EOSABS 0.2 09/15/2014 0810   BASOSABS 0.0 08/09/2022 0354   BASOSABS 0.1 09/15/2014 0810   Comprehensive Metabolic  Panel:    Component Value Date/Time   NA 133 (L) 08/09/2022 0354   NA 138 09/09/2017  0000   NA 142 05/23/2014 1047   K 4.4 08/09/2022 0354   K 3.7 05/23/2014 1047   CL 110 08/09/2022 0354   CL 104 05/23/2014 1047   CO2 12 (L) 08/09/2022 0354   CO2 28 05/23/2014 1047   BUN 49 (H) 08/09/2022 0354   BUN 12 09/09/2017 0000   BUN 10 05/23/2014 1047   CREATININE 1.55 (H) 08/09/2022 0354   CREATININE 1.51 (H) 08/10/2022 1013   CREATININE 1.2 05/23/2014 1047   GLUCOSE 143 (H) 08/09/2022 0354   GLUCOSE 90 05/23/2014 1047   CALCIUM 6.9 (L) 08/09/2022 0354   CALCIUM 10.9 (H) 04/14/2018 1159   AST 37 08/09/2022 0354   AST 13 (L) 08/11/2022 1013   ALT 47 (H) 08/09/2022 0354   ALT 27 08/10/2022 1013   ALT 180 (H) 05/23/2014 1047   ALKPHOS 51 08/09/2022 0354   ALKPHOS 77 05/23/2014 1047   BILITOT 1.5 (H) 08/09/2022 0354   BILITOT 0.6 08/06/2022 1013   PROT 5.5 (L) 08/09/2022 0354   PROT 7.8 05/23/2014 1047   ALBUMIN 3.0 (L) 08/09/2022 0354   ALBUMIN 3.1 (L) 05/23/2014 1047    Physical Exam: Vital Signs: BP (!) 150/94 (BP Location: Right Arm)   Pulse (!) 102   Temp 98.8 F (37.1 C) (Axillary)   Resp 20   SpO2 99%  SpO2: SpO2: 99 % O2 Device: O2 Device: Nasal Cannula O2 Flow Rate: O2 Flow Rate (L/min): 5 L/min Intake/output summary:  Intake/Output Summary (Last 24 hours) at 08/09/2022 [id number] Last data filed at 08/09/2022 [id number] Gross per 24 hour  Intake 6628.11 ml  Output 2025 ml  Net 4603.11 ml   LBM: Last BM Date :  (PTA) Baseline Weight:   Most recent weight:    General: laying in bed, ill appearing, grimacing, will awaken at times  Eyes: No drainage noted HENT: moist mucous membranes Cardiovascular: RRR Respiratory: Slightly increased work of breathing noted Abdomen: not distended Skin: no rashes or lesions on visible skin Neuro: Sleepy, will awaken for short periods of time   Palliative Performance Scale: 20%              Additional Data Reviewed: Recent Labs     08/08/22 0431 08/08/22 0604 08/08/22 1643 08/09/22 0354  WBC  --    < > 0.6* 0.4*  HGB  --    < > 9.2* 8.9*  PLT  --    < > 18* 14*  NA 134*  --   --  133*  BUN 79*  --   --  49*  CREATININE 2.65*  --   --  1.55*   < > = values in this interval not displayed.    Imaging: DG CHEST PORT 1 VIEW CLINICAL DATA:  Sepsis  EXAM: PORTABLE CHEST 1 VIEW  COMPARISON:  CXR 08/08/22  FINDINGS: Left-sided dual lead cardiac device with unchanged lead positioning. Left-sided central venous catheter with tip near the confluence of brachiocephalic veins, unchanged from prior exam.  No pleural effusion. No pneumothorax. Unchanged cardiac and mediastinal contours. Compared to prior exam there are new hazy airspace opacities in the left mid and lower lung fields, suspicious for infection. There are unchanged linear airspace opacities at the right lung apex.  No radiographically apparent displaced rib fractures. Visualized upper abdomen is unremarkable.  IMPRESSION: Compared to prior exam there are new hazy airspace opacities in the left mid and lower lung fields, suspicious for infection.  Electronically Signed   By: Marin Olp.D.  On: 08/09/2022 08:23    I personally reviewed recent imaging.   Palliative Care Assessment and Plan Summary of Established Goals of Care and Medical Treatment Preferences   Patient is an 87 year old male with a past medical history of tobacco use and relapsed multiple myeloma with metastatic disease to bone and lung who was admitted on 08/08/2022 from the cancer center for management of weakness and cytopenia.  Patient transferred to the ICU once developed worsening mental status, hypotension, and fever.  Since being admitted, patient has been receiving aggressive medical management for septic shock secondary to pseudomonal bacteremia with neutropenic fever.  Patient also receiving support for pancytopenia in the setting of chemotherapy and multiple  myeloma.  Patient also receiving support for AKI from ATN in the setting of sepsis.  Palliative medicine team consulted to assist with complex medical decision making.  # Complex medical decision making/goals of care  -Patient awake though not able to participate in complex medical decision making at this time.  Spent time with patient's daughter at bedside.  Discussed medical pathways for care moving forward.  At this time we will continue with aggressive appropriate medical management.  Will change CODE STATUS to DNR/DNI as patient has previously stated to daughter he would never want to be kept alive on machines and had acknowledged "acceptance" when its his time. Will continue with appropriate medical care up until that point as hoping patient can continue to improve from infection while receiving appropraite medical care.  -  Code Status: DNR    -Code status appropriately changed to DNR/DNI, continue appropriate medical care based on patient's previously stated own wishes and discussion with daughter at bedside today.  # Symptom management  -Pain, in the setting of relapsed multiple myeloma with metastatic disease to bone and lung   -Agree with use of IV fentanyl as needed to assist with pain management.   -Should patient continue to become more awake, also recommend addition of scheduling Tylenol 1000 mg every 8 hours during the day.  # Psycho-social/Spiritual Support:  - Support System: Daughter, multiple family members - Desire for further Chaplain support: Informed daughter that chaplain support was available at the hospital 24/7 if needed additional spiritual support.  # Discharge Planning:  TBD  Thank you for allowing the palliative care team to participate in the care [name].  [name], DO Palliative Care Provider PMT # [phone]  If patient remains symptomatic despite maximum doses, please call PMT at [phone] between 0700 and 1900. Outside of these hours, please  call attending, as PMT does not have night coverage.  *Please note that this is a verbal dictation therefore any spelling or grammatical errors are due to the "[place]" system interpretation.

## 2022-08-09 NOTE — Progress Notes (Signed)
SLP Cancellation Note  Patient Details Name: [name] MRN: [name] DOB: 13-May-1936   Cancelled treatment:       Reason Eval/Treat Not Completed: Fatigue/lethargy limiting ability to participate (Pt's case discussed with Salomon Fick, RN who advised that the SLP swallow evaluation be deferred today due to pt's lethargy.)  Kiauna Zywicki I. Hardin Negus, [place], [place] Office number [phone]  Horton Marshall 08/09/2022, 3:58 PM

## 2022-08-09 NOTE — Progress Notes (Addendum)
Pharmacy Antibiotic Note  Jonathon [name] is a 87 y.o. male admitted on 07/30/2022 with weakness of unclear etiology. PMH significant for multiple myeloma with bone metastases, PA.  Patient hypotensive overnight and transferred to ICU.    3/28 - Pharmacy consulted for Vancomycin and Cefepime dosing for febrile neutropenia.  Later, BCID detected P aeruginosa in 2/4 blood culture bottles and vancomycin stopped.    3/29 - Remains on cefepime and azithromycin.  Serum creatinine has improved to 1.55 and est crcl 37 ml/min. 10:00 Received Pharmacy consult for ciprofloxacin for sepsis / neutropenic fever and while awaiting blood culture sensitivites.   Plan: Change Cefepime to 2 g IV q12h Initiate Cipro 400 mg IV q12h Monitor clinical progress & renal function F/U C&S / LOT     Temp (24hrs), Avg:98.7 F (37.1 C), Min:97.5 F (36.4 C), Max:100.4 F (38 C)  Recent Labs  Lab 08/03/22 0235 07/19/2022 1013 08/08/22 0334 08/08/22 0431 08/08/22 0604 08/08/22 1643 08/09/22 0354  WBC 2.4* 2.0* 1.2*  --  0.6* 0.6* 0.4*  CREATININE 1.44* 1.51* 2.58* 2.65*  --   --  1.55*  LATICACIDVEN  --   --  4.1*  --   --   --  4.2*     Estimated Creatinine Clearance: 36.6 mL/min (A) (by C-G formula based on SCr of 1.55 mg/dL (H)).    Allergies  Allergen Reactions   Lexapro [Escitalopram Oxalate]     3/30-08/16/17 ? Serotonin Syndrome    Antimicrobials this admission: 3/28 Ceftriaxone x 1 3/28 Cefepime >>   3/28 Vancomycin >>3/28 3/29 Cipro >>  Dose adjustments this admission: 3/29 cefepime changed from q24h to q12h   Microbiology results: 3/27 BCx:  2/4 bottles P. Aeruginosa; awaiting susceptibilities 3/27 UCx:   no growth 3/28 MRSA PCR:  not detected  Thank you for allowing pharmacy to be a part of this patient's care.  Suzzanne Cloud, PharmD, BCPS 08/09/2022 7:42 AM  Addendum  Ok to stop cipro for double coverage per Noe Gens. Sens here is 92% to cefepime.  Onnie Boer, PharmD, BCIDP,  AAHIVP, CPP Infectious Disease Pharmacist 08/09/2022 11:36 AM

## 2022-08-09 NOTE — Progress Notes (Signed)
Patient's family at bedside requesting the patient attempt to drink something. Patient sat upright and given a small sip of water. Patient immediately aspirated and will continue with NPO measures; order placed for speech eval when patient is more alert to have a swallow test as, per family, patient has trouble swallowing at home.

## 2022-08-09 NOTE — Progress Notes (Addendum)
NAME:  Jonathon [name], MRN:  [id number], DOB:  [birth year], LOS: 1 ADMISSION DATE:  08/06/2022, CONSULTATION DATE:  08/08/2022 REFERRING MD:  Mirna Mires, [facility], CHIEF COMPLAINT:  hypotension.  History of Present Illness:  87 y/o male former smoker with relapsed multiple myeloma with bone and lung metastases treated with Isatuximab/Pomolidimide/Dexamethasone was sent from cancer center with weakness and cytopenias.  He developed hypotension, worsening mental status, Tmax 100.9 and progressive decrease in cytopenias.  Transferred to ICU.  Started on IV fluid, pressors, ABx with plan for PRBC transfusion.  Procalcitonin elevated.  PCCM consulted to assist with management in ICU.  Pertinent  Medical History  Asthma, BPH, HFpEF, CIDP, HTN, Nephrolithiasis, SVT, s/p PPM, PAF on eliquis, Neuropathy  Significant Hospital Events: Including procedures, antibiotic start and stop dates in addition to other pertinent events   3/27 admit 3/28 transfer to ICU; start ABx, IV fluids, pressors  Interim History / Subjective:  Afebrile  RN reports pt moaning / appears uncomfortable but denies pain  Remains on 5L [place]  Levophed 50mcg, vasopressin infusions  I/O 2L UOP, +6.8L in last 24 hours   Objective   BP (!) 144/56   Pulse 92   Temp 98.7 F (37.1 C) (Axillary)   Resp (!) 25   SpO2 99%       Intake/Output Summary (Last 24 hours) at 08/09/2022 0745 Last data filed at 08/09/2022 [id number] Gross per 24 hour  Intake 9122.65 ml  Output 2025 ml  Net 7097.65 ml   There were no vitals filed for this visit.  Examination: General: chronically ill appearing elderly male lying in bed in NAD, acutely ill on vasopressors HEENT: MM pink/dry, [place] O2, anicteric, pupils =/reactive  Neuro: Awakens to voice, speech clear, MAE, denies pain/SOB CV: s1s2 RRR, paced rhythm, no m/r/g PULM: non-labored at rest, lungs bilaterally clear with good air entry, no wheeze GI: soft, bsx4 active  Extremities: warm/dry, trace dependent  edema  Skin: BLE with hyperpigmented lesions, R FA with dark brown circular lesion, soft tissue forehead lesion  BCx2 3/27 > Pseudomonas > UC 3/27 >  Resolved Hospital Problem list     Assessment & Plan:   Septic Shock secondary to Pseudomonal Bacteremia with Neutropenic Fever  -reduce NS to 1102ml/hr, assess VBG > may need to change IVF to sodium bicarb for one bag given CO2 12 and ongoing vasopressin use -vasopressin + levophed for MAP >65  -D2/x cefepime -add cipro for double coverage for PSA until sensitivities returned given immune compromise  -follow up blood, urine cultures  -stress dose steroids given prior decadron usage -family remains hopeful for recovery, but he has stated "when it is my time, it is ok to go" in the past to his family.  Will consult Palliative Care to discuss goals of care.   Acute Metabolic Encephalopathy / Altered mental status in setting of sepsis, AKI CIDP CT head negative.  -hold home trazodone, tramadol  -minimize sedating medications to allow pt to be awake   Pancytopenia from chemotherapy and multiple myeloma. Difficulty obtaining PRBC given antibodies  -follow CBC -transfuse for Hgb <7% or active bleeding   AKI from ATN in setting of sepsis. Non-gap metabolic acidosis. Hyperkalemia. BPH. Baseline creatinine 1.51 from 08/03/22. Renal US negative for hydronephrosis -hold home flomax -Trend BMP / urinary output -Replace electrolytes as indicated -Avoid nephrotoxic agents, ensure adequate renal perfusion  Hx of PAF, HFpEF. -tele monitoring  -hold home eliquis, flecanide, lasix, diltiazem, avapro, lopressor for now    Hyperglycemia  Glucose 124-204 -SSI, resistant scale -levemir 5 units  Cancer Related Pain  -PRN IV Fentanyl   Best Practice (right click and "Reselect all SmartList Selections" daily)  Diet/type: NPO DVT prophylaxis: SCD GI prophylaxis: N/A Lines: N/A Foley:  N/A Code Status:  full code Last date of  multidisciplinary goals of care discussion:  Daughter, [name], updated via phone 3/29 on details, new culture information, plan of care. Support offered.   Critical care time: 50 minutes    Noe Gens, MSN, APRN, NP-C, AGACNP-BC [facility] 08/09/2022, 7:45 AM   Please see [email or web address] for pager details.   From 7A-7P if no response, please call [phone] After hours, please call ELink [phone]

## 2022-08-09 NOTE — Progress Notes (Signed)
Chaplain was present as family came to the room to see patient.  One family member became very emotional and was not able to be in the room.  Chaplain provided support to him as well as other family members present.  [address], [place] Pager, [phone]

## 2022-08-09 NOTE — Progress Notes (Signed)
RT NOTE:  Pt NTS per CCMD order. RT obtained at moderate amount of tan, pin-tinged, thick secretions.

## 2022-08-10 ENCOUNTER — Inpatient Hospital Stay (HOSPITAL_COMMUNITY): Payer: Medicare Other

## 2022-08-10 DIAGNOSIS — Z66 Do not resuscitate: Secondary | ICD-10-CM | POA: Diagnosis not present

## 2022-08-10 DIAGNOSIS — G893 Neoplasm related pain (acute) (chronic): Secondary | ICD-10-CM | POA: Diagnosis not present

## 2022-08-10 DIAGNOSIS — R609 Edema, unspecified: Secondary | ICD-10-CM | POA: Diagnosis not present

## 2022-08-10 DIAGNOSIS — L538 Other specified erythematous conditions: Secondary | ICD-10-CM

## 2022-08-10 DIAGNOSIS — A419 Sepsis, unspecified organism: Secondary | ICD-10-CM | POA: Diagnosis not present

## 2022-08-10 DIAGNOSIS — Z7189 Other specified counseling: Secondary | ICD-10-CM | POA: Diagnosis not present

## 2022-08-10 DIAGNOSIS — R6521 Severe sepsis with septic shock: Secondary | ICD-10-CM | POA: Diagnosis not present

## 2022-08-10 DIAGNOSIS — Z515 Encounter for palliative care: Secondary | ICD-10-CM | POA: Diagnosis not present

## 2022-08-10 DIAGNOSIS — R52 Pain, unspecified: Secondary | ICD-10-CM | POA: Diagnosis not present

## 2022-08-10 LAB — COMPREHENSIVE METABOLIC PANEL
ALT: 49 U/L — ABNORMAL HIGH (ref 0–44)
AST: 26 U/L (ref 15–41)
Albumin: 2.3 g/dL — ABNORMAL LOW (ref 3.5–5.0)
Alkaline Phosphatase: 52 U/L (ref 38–126)
Anion gap: 10 (ref 5–15)
BUN: 42 mg/dL — ABNORMAL HIGH (ref 8–23)
CO2: 14 mmol/L — ABNORMAL LOW (ref 22–32)
Calcium: 6.8 mg/dL — ABNORMAL LOW (ref 8.9–10.3)
Chloride: 116 mmol/L — ABNORMAL HIGH (ref 98–111)
Creatinine, Ser: 1.38 mg/dL — ABNORMAL HIGH (ref 0.61–1.24)
GFR, Estimated: 50 mL/min — ABNORMAL LOW (ref >60)
Glucose, Bld: 115 mg/dL — ABNORMAL HIGH (ref 70–99)
Potassium: 3.5 mmol/L (ref 3.5–5.1)
Sodium: 140 mmol/L (ref 135–145)
Total Bilirubin: 1.4 mg/dL — ABNORMAL HIGH (ref 0.3–1.2)
Total Protein: 5.2 g/dL — ABNORMAL LOW (ref 6.5–8.1)

## 2022-08-10 LAB — CULTURE, BLOOD (ROUTINE X 2)
Special Requests: ADEQUATE
Special Requests: ADEQUATE

## 2022-08-10 LAB — GLUCOSE, CAPILLARY
Glucose-Capillary: 109 mg/dL — ABNORMAL HIGH (ref 70–99)
Glucose-Capillary: 134 mg/dL — ABNORMAL HIGH (ref 70–99)
Glucose-Capillary: 139 mg/dL — ABNORMAL HIGH (ref 70–99)
Glucose-Capillary: 155 mg/dL — ABNORMAL HIGH (ref 70–99)
Glucose-Capillary: 162 mg/dL — ABNORMAL HIGH (ref 70–99)
Glucose-Capillary: 99 mg/dL (ref 70–99)

## 2022-08-10 LAB — CBC
HCT: 23.4 % — ABNORMAL LOW (ref 39.0–52.0)
Hemoglobin: 7.9 g/dL — ABNORMAL LOW (ref 13.0–17.0)
MCH: 28.2 pg (ref 26.0–34.0)
MCHC: 33.8 g/dL (ref 30.0–36.0)
MCV: 83.6 fL (ref 80.0–100.0)
Platelets: 8 10*3/uL — CL (ref 150–400)
RBC: 2.8 MIL/uL — ABNORMAL LOW (ref 4.22–5.81)
RDW: 18.7 % — ABNORMAL HIGH (ref 11.5–15.5)
WBC: 0.1 10*3/uL — CL (ref 4.0–10.5)
nRBC: 0 % (ref 0.0–0.2)

## 2022-08-10 MED ORDER — FENTANYL CITRATE PF 50 MCG/ML IJ SOSY
25.0000 ug | PREFILLED_SYRINGE | INTRAMUSCULAR | Status: DC | PRN
  Administered 2022-08-10 – 2022-08-11 (×5): 50 ug via INTRAVENOUS
  Administered 2022-08-11 – 2022-08-12 (×2): 25 ug via INTRAVENOUS
  Administered 2022-08-12: 50 ug via INTRAVENOUS
  Filled 2022-08-10 (×8): qty 1

## 2022-08-10 MED ORDER — HYDROCORTISONE SOD SUC (PF) 100 MG IJ SOLR
100.0000 mg | Freq: Two times a day (BID) | INTRAMUSCULAR | Status: AC
  Administered 2022-08-10 – 2022-08-11 (×3): 100 mg via INTRAVENOUS
  Filled 2022-08-10 (×3): qty 2

## 2022-08-10 MED ORDER — SODIUM CHLORIDE 0.9% IV SOLUTION: Freq: Once | INTRAVENOUS | Status: AC

## 2022-08-10 NOTE — Evaluation (Signed)
Clinical/Bedside Swallow Evaluation Patient Details  Name: Jonathon [name] MRN: [id number] Date of Birth: 1936/04/02  Today's Date: 08/10/2022 Time: SLP Start Time (ACUTE ONLY): [id number] SLP Stop Time (ACUTE ONLY): 1427 SLP Time Calculation (min) (ACUTE ONLY): 22 min  Past Medical History:  Past Medical History:  Diagnosis Date   Asthma    BENIGN PROSTATIC HYPERTROPHY, HX OF 10/25/2008   Bone metastases 04/14/2018   BRADYCARDIA 2005   Chronic diastolic congestive heart failure ([place]) 06/05/2016   CIDP (chronic inflammatory demyelinating polyneuropathy) ([place])    HYPERTENSION 11/21/2006   Iron deficiency anemia, unspecified 04/19/2013   Multiple myeloma ([place]) 04/29/2018   NEPHROLITHIASIS, HX OF 11/21/2006 and 10/15/2008   PACEMAKER, PERMANENT 2005   Gen change 2014 Medtronic Adaptic L dual-chamber pacemaker, serial [id number]    SVT (supraventricular tachycardia)    TOBACCO ABUSE 10/25/2008   Quit 2012   Past Surgical History:  Past Surgical History:  Procedure Laterality Date   COLONOSCOPY  2004,2009   PACEMAKER INSERTION  05/14/2003   PERMANENT PACEMAKER GENERATOR CHANGE N/A 06/19/2012   Procedure: PERMANENT PACEMAKER GENERATOR CHANGE;  Surgeon: [name], MD; Medtronic Adaptic L dual-chamber pacemaker, serial [id number]     SKIN GRAFT Right 05/13/1960   wrist   TOOTH EXTRACTION  11/10/2020   3 teeth removed   HPI:  Jonathon [name] is a 87 y.o. male with medical history significant for multiple myeloma with bone metastases and polyneuropathy, paroxysmal atrial fibrillation on Eliquis, being admitted to the hospital for weakness and cytopenias.  I was called by Dr. [name] his covering oncologist, as there was concern for infections significant weakness since he lives alone, he was also noted on blood counts today to show significant cytopenia decline from his prior treatment 2 weeks ago, today with white blood cell count 2.0, hemoglobin 9.7, and platelets of 21.  At baseline, the patient is  ambulatory and functional, living by himself, unfortunately today he was found to be unable to stand unassisted.  History is provided by the patient who seems to be slightly confused, and his daughter who is with him today at the bedside.  Of note, he was treated as an outpatient for a possible urinary tract infection was found to have urinary retention recently, and is currently on a Foley catheter with plans to see urology as an outpatient.  Patient and daughter deny any chest pain, shortness of breath, nausea, vomiting, fevers, chills.     The patient was directly admitted from the oncology center, according to nursing staff he had a vasovagal event when standing to be transferred in the room.  He did not fall, regained consciousness very quickly and is alert and oriented x 4.  Of note, the patient does live alone with some home health.  His daughter is closely involved in his care.CXR 3/29 Compared to prior exam there are new hazy airspace opacities in the  left mid and lower lung fields, suspicious for infection ; ST consulted for BSE to assess swallow function.    Assessment / Plan / Recommendation  Clinical Impression  Limited clinical swallowing evaluation completed d/t pt fatigued, deconditioned, but improved alertness with min verbal cues provided.  Pt was motivated for PO intake.  Oral care completed and pt offered ice chips one at a time, thin via 1/3 tsp amounts and 1/3 tsp puree (2 total) with oral holding, prolonged oral transit and suspect delay in the initiation of the swallow given age and current medical state.  Pt did exhibit  a strong delayed cough with potential pharyngeal residue eliciting this, but airway compromise cannot be ruled out.  Due to pt's moderate risk for aspiration, recommend continue NPO and staff to provide ice chips/1/3 tsp water by mouth after aggressive oral care implemented.  ST will f/u for PO readiness.  Thank you for this consult. SLP Visit Diagnosis: Dysphagia,  oropharyngeal phase (R13.12)    Aspiration Risk  Mild aspiration risk;Moderate aspiration risk;Risk for inadequate nutrition/hydration    Diet Recommendation   NPO; ice chips/1/3 tsp water when alert after oral care with staff only  Medication Administration: Via alternative means    Other  Recommendations Oral Care Recommendations: Oral care QID;Staff/trained caregiver to provide oral care    Recommendations for follow up therapy are one component of a multi-disciplinary discharge planning process, led by the attending physician.  Recommendations may be updated based on patient status, additional functional criteria and insurance authorization.  Follow up Recommendations Follow physician's recommendations for discharge plan and follow up therapies      Assistance Recommended at Discharge    Functional Status Assessment Patient has had a recent decline in their functional status and demonstrates the ability to make significant improvements in function in a reasonable and predictable amount of time.  Frequency and Duration min 2x/week  1 week       Prognosis Prognosis for improved oropharyngeal function: Good Barriers to Reach Goals: Severity of deficits      Swallow Study   General Date of Onset: 08/06/2022 HPI: Jonathon [name] is a 87 y.o. male with medical history significant for multiple myeloma with bone metastases and polyneuropathy, paroxysmal atrial fibrillation on Eliquis, being admitted to the hospital for weakness and cytopenias.  I was called by Dr. [name] his covering oncologist, as there was concern for infections significant weakness since he lives alone, he was also noted on blood counts today to show significant cytopenia decline from his prior treatment 2 weeks ago, today with white blood cell count 2.0, hemoglobin 9.7, and platelets of 21.  At baseline, the patient is ambulatory and functional, living by himself, unfortunately today he was found to be unable to stand  unassisted.  History is provided by the patient who seems to be slightly confused, and his daughter who is with him today at the bedside.  Of note, he was treated as an outpatient for a possible urinary tract infection was found to have urinary retention recently, and is currently on a Foley catheter with plans to see urology as an outpatient.  Patient and daughter deny any chest pain, shortness of breath, nausea, vomiting, fevers, chills.     The patient was directly admitted from the oncology center, according to nursing staff he had a vasovagal event when standing to be transferred in the room.  He did not fall, regained consciousness very quickly and is alert and oriented x 4.  Of note, the patient does live alone with some home health.  His daughter is closely involved in his care.CXR 3/29 Compared to prior exam there are new hazy airspace opacities in the  left mid and lower lung fields, suspicious for infection ; ST consulted for BSE to assess swallow function. Type of Study: Bedside Swallow Evaluation Previous Swallow Assessment: n/a Diet Prior to this Study: NPO Temperature Spikes Noted: Yes (low grade, 99) Respiratory Status: Nasal cannula History of Recent Intubation: No Behavior/Cognition: Alert;Cooperative;Requires cueing Oral Cavity Assessment: Dry Oral Care Completed by SLP: Yes Oral Cavity - Dentition: Adequate natural dentition;Missing dentition  Self-Feeding Abilities: Needs assist Patient Positioning: Upright in bed Baseline Vocal Quality: Low vocal intensity Volitional Cough: Strong Volitional Swallow: Able to elicit    Oral/Motor/Sensory Function Overall Oral Motor/Sensory Function: Generalized oral weakness Facial Symmetry: Within Functional Limits   Ice Chips Ice chips: Impaired Presentation: Spoon Oral Phase Impairments: Reduced lingual movement/coordination Oral Phase Functional Implications: Oral holding;Prolonged oral transit Pharyngeal Phase Impairments: Suspected  delayed Swallow   Thin Liquid Thin Liquid: Impaired Presentation: Spoon Pharyngeal  Phase Impairments: Suspected delayed Swallow;Multiple swallows;Cough - Delayed    Nectar Thick Nectar Thick Liquid: Not tested   Honey Thick Honey Thick Liquid: Not tested   Puree Puree: Impaired Presentation: Spoon Oral Phase Impairments: Impaired mastication Oral Phase Functional Implications: Prolonged oral transit;Oral holding Pharyngeal Phase Impairments: Suspected delayed Swallow;Multiple swallows;Cough - Delayed   Solid     Solid: Not tested      Pat Leni Pankonin,M.S., CCC-SLP 08/10/2022,2:39 PM

## 2022-08-10 NOTE — Progress Notes (Addendum)
NAME:  Jonathon [name], MRN:  [id number], DOB:  Apr 22, 1936, LOS: 2 ADMISSION DATE:  08/08/2022, CONSULTATION DATE:  08/08/2022 REFERRING MD:  Mirna Mires, [facility], CHIEF COMPLAINT:  hypotension.  History of Present Illness:  87 y/o male former smoker with relapsed multiple myeloma with bone and lung metastases treated with Isatuximab/Pomolidimide/Dexamethasone was sent from cancer center with weakness and cytopenias.  He developed hypotension, worsening mental status, Tmax 100.9 and progressive decrease in cytopenias.  Transferred to ICU.  Started on IV fluid, pressors, ABx with plan for PRBC transfusion.  Procalcitonin elevated.  PCCM consulted to assist with management in ICU.  Pertinent  Medical History  Asthma, BPH, HFpEF, CIDP, HTN, Nephrolithiasis, SVT, s/p PPM, PAF on eliquis, Neuropathy  Significant Hospital Events: Including procedures, antibiotic start and stop dates in addition to other pertinent events   3/27 admit 3/28 transfer to ICU; start ABx, IV fluids, pressors  Interim History / Subjective:  I/O+ 12.3 L total Norepinephrine has been weaned to off Vasopressin off S Cr 1.55 > 1.38   Objective   BP (!) 126/58   Pulse (!) 111   Temp 98.1 F (36.7 C) (Axillary)   Resp (!) 23   Ht 5\' 10"  (1.778 m)   Wt 88.4 kg   SpO2 100%   BMI 27.96 kg/m       Intake/Output Summary (Last 24 hours) at 08/10/2022 0741 Last data filed at 08/10/2022 0716 Gross per 24 hour  Intake 4103.62 ml  Output 1250 ml  Net 2853.62 ml   Filed Weights   08/09/22 0907  Weight: 88.4 kg    Examination: General: Chronically ill-appearing man laying in bed.  A bit stronger HEENT: Oropharynx clear, no evidence of secretions currently, strong voice Neuro: Awake, tracks, follows commands CV: Distant, no murmur PULM: Decreased at both bases, clear GI: Nondistended, hypoactive bowel sounds.  Tender bilateral upper quadrants.  Extremities: Trace dependent edema Skin: BLE with hyperpigmented lesions, R FA  with dark brown circular lesion, soft tissue forehead lesion  BCx2 3/27 > Pseudomonas > UC 3/27 > no growth  Resolved Hospital Problem list     Assessment & Plan:   Septic Shock secondary to Pseudomonal Bacteremia with Neutropenic Fever  -KVO his IV fluids now that his pressors are off -Decrease stress dose steroids to hydrocortisone 100 mg every 12 hours, plan to wean quickly to his baseline dexamethasone -Continue cefepime, day 3.  Deferred Pseudomonas double coverage, await sensitivities -Appreciate palliative care assistance and input  Acute Metabolic Encephalopathy / Altered mental status in setting of sepsis, AKI CIDP CT head negative.  -Currently holding home tramadol, trazodone, restart as able depending on mental status improvement -Try to minimize sedating medications   Pancytopenia from chemotherapy and multiple myeloma. Difficulty obtaining PRBC given antibodies  -Following CBC -Neutropenic precautions -Transfuse platelets 3/30, goal > 10k -Transfusion goal hemoglobin 7.0  AKI from ATN in setting of sepsis. Non-gap metabolic acidosis. Hyperkalemia. BPH. Baseline creatinine 1.51 from 08/03/22. Renal US negative for hydronephrosis -Following urine output and BMP -Replace electrolytes as indicated -Maintain adequate renal perfusion  Hx of PAF, HFpEF. -Telemetry monitoring -Continue to hold home Eliquis given platelets 8 -Holding diltiazem, Lasix, Avapro, metoprolol -Consider restart flecainide 3/30   Hyperglycemia  Glucose 124-204 -Sliding scale insulin resistant scale -Levemir 5 units  Cancer Related Pain  -Fentanyl if needed  Best Practice (right click and "Reselect all SmartList Selections" daily)  Diet/type: NPO DVT prophylaxis: SCD GI prophylaxis: N/A Lines: N/A Foley:  N/A Code Status:  full  code Last date of multidisciplinary goals of care discussion:  Daughter, Jonathon [name], updated via phone 3/29 on details, new culture information, plan of care.  Support offered.   Critical care time: 31 minutes     Baltazar Apo, MD, PhD 08/10/2022, 7:42 AM [facility] [phone] or if no answer before 7:00PM call [phone] For any issues after 7:00PM please call eLink [phone]

## 2022-08-10 NOTE — Progress Notes (Signed)
RUE venous duplex has been completed.   Results can be found under chart review under CV PROC. 08/10/2022 2:00 PM Sparrow Sanzo RVT, RDMS

## 2022-08-10 NOTE — Progress Notes (Signed)
Daily Progress Note   Patient Name: Jonathon [name]       Date: 08/10/2022 DOB: 03/04/1936  Age: 87 y.o. MRN#: [name] Attending Physician: Collene Gobble, MD Primary Care Physician: [name], MD Admit Date: 07/25/2022 Length of Stay: 2 days  Reason for Consultation/Follow-up: Establishing goals of care  Subjective:   CC: Patient resting comfortably this morning without signs of discomfort currently. Patient's grandson at bedside. Following up regarding complex medical decision making.   Subjective:  Reviewed EMR prior to presenting to bedside.  At time of EMR review, patient had received IV fentanyl 25 mcg x 7 doses for pain management. Patient no longer needing pressor support.  Patient remains n.p.o. due to episode of dysphagia yesterday.  Patient seen resting comfortably in bed.  Patient's grandson at bedside visiting. Discussed care with bedside RN.  RN noted that patient required multiple doses of IV fentanyl overnight to assist with appropriate level of pain control.  Patient did to receive extra dose of 25 mcg to achieve pain relief.  Discussed increasing IV fentanyl today to allow for range of 25-50 mcg for appropriate pain management. Patient continues to receive appropriate level of medical care.  Objective:   Vital Signs:  BP (!) 126/58   Pulse (!) 111   Temp 99 F (37.2 C) (Axillary)   Resp (!) 23   Ht 5\' 10"  (1.778 m)   Wt 88.4 kg   SpO2 100%   BMI 27.96 kg/m   Physical Exam: General: laying in bed, comfortable, resting  Eyes: No drainage noted HENT: moist mucous membranes Cardiovascular: RRR Respiratory: no increased work of breathing noted Abdomen: not distended Skin: no rashes or lesions on visible skin Neuro: sleeping comfortably   Imaging: I personally reviewed recent imaging.   Assessment & Plan:   Assessment: Patient is an 87 year old male with a past medical history of tobacco use and relapsed multiple myeloma with metastatic disease  to bone and lung who was admitted on 08/08/2022 from the cancer center for management of weakness and cytopenia. Patient transferred to the ICU once developed worsening mental status, hypotension, and fever. Since being admitted, patient has been receiving aggressive medical management for septic shock secondary to pseudomonal bacteremia with neutropenic fever. Patient also receiving support for pancytopenia in the setting of chemotherapy and multiple myeloma. Patient also receiving support for AKI from ATN in the setting of sepsis. Palliative medicine team consulted to assist with complex medical decision making.   Recommendations/Plan: # Complex medical decision making/goals of care:  - As per discussion with daughter yesterday, continuing with appropriate level of medical care.  CODE STATUS was changed to DNR on 08/09/2022 as per patient's previously stated wishes for care.  At this time patient slowly medically improving.  -  Code Status: DNR  # Symptom management:      -Pain, in the setting of relapsed multiple myeloma with metastatic disease to bone and lung                               -Increased IV fentanyl to 25-52mcg q2hrs prn as needed to assist with pain management.                               -When patient able to take oral meds, could consider addition of scheduled Tylenol and transition to low dose oral opioids if needed for  longer lasting pain relief  # Psycho-social/Spiritual Support:  - Support System: Daughter, grandson - Desire for further Chaplain support: Appreciate chaplain support  # Discharge Planning: TBD  Thank you for allowing the palliative care team to participate in the care [name].  [name], DO Palliative Care Provider PMT # [phone]  If patient remains symptomatic despite maximum doses, please call PMT at [phone] between 0700 and 1900. Outside of these hours, please call attending, as PMT does not have night coverage.  *Please note that  this is a verbal dictation therefore any spelling or grammatical errors are due to the "[employer]" system interpretation.

## 2022-08-11 DIAGNOSIS — N179 Acute kidney failure, unspecified: Secondary | ICD-10-CM | POA: Diagnosis not present

## 2022-08-11 DIAGNOSIS — R7881 Bacteremia: Secondary | ICD-10-CM

## 2022-08-11 DIAGNOSIS — R531 Weakness: Secondary | ICD-10-CM | POA: Diagnosis not present

## 2022-08-11 DIAGNOSIS — G9341 Metabolic encephalopathy: Secondary | ICD-10-CM | POA: Diagnosis not present

## 2022-08-11 DIAGNOSIS — A419 Sepsis, unspecified organism: Secondary | ICD-10-CM | POA: Diagnosis not present

## 2022-08-11 DIAGNOSIS — B965 Pseudomonas (aeruginosa) (mallei) (pseudomallei) as the cause of diseases classified elsewhere: Secondary | ICD-10-CM

## 2022-08-11 LAB — PREPARE PLATELET PHERESIS
Unit division: 0
Unit division: 0

## 2022-08-11 LAB — DIFFERENTIAL
Abs Immature Granulocytes: 0 10*3/uL (ref 0.00–0.07)
Basophils Absolute: 0 10*3/uL (ref 0.0–0.1)
Basophils Relative: 0 %
Eosinophils Absolute: 0 10*3/uL (ref 0.0–0.5)
Eosinophils Relative: 9 %
Immature Granulocytes: 0 %
Lymphocytes Relative: 0 %
Lymphs Abs: 0 10*3/uL — ABNORMAL LOW (ref 0.7–4.0)
Monocytes Absolute: 0 10*3/uL — ABNORMAL LOW (ref 0.1–1.0)
Monocytes Relative: 9 %
Neutro Abs: 0.1 10*3/uL — CL (ref 1.7–7.7)
Neutrophils Relative %: 82 %

## 2022-08-11 LAB — CBC
HCT: 20.8 % — ABNORMAL LOW (ref 39.0–52.0)
Hemoglobin: 7 g/dL — ABNORMAL LOW (ref 13.0–17.0)
MCH: 28.1 pg (ref 26.0–34.0)
MCHC: 33.7 g/dL (ref 30.0–36.0)
MCV: 83.5 fL (ref 80.0–100.0)
Platelets: 24 10*3/uL — CL (ref 150–400)
RBC: 2.49 MIL/uL — ABNORMAL LOW (ref 4.22–5.81)
RDW: 19.2 % — ABNORMAL HIGH (ref 11.5–15.5)
WBC: 0.1 10*3/uL — CL (ref 4.0–10.5)
nRBC: 16.7 % — ABNORMAL HIGH (ref 0.0–0.2)

## 2022-08-11 LAB — COMPREHENSIVE METABOLIC PANEL
ALT: 49 U/L — ABNORMAL HIGH (ref 0–44)
AST: 20 U/L (ref 15–41)
Albumin: 2.2 g/dL — ABNORMAL LOW (ref 3.5–5.0)
Alkaline Phosphatase: 54 U/L (ref 38–126)
Anion gap: 11 (ref 5–15)
BUN: 50 mg/dL — ABNORMAL HIGH (ref 8–23)
CO2: 14 mmol/L — ABNORMAL LOW (ref 22–32)
Calcium: 6.7 mg/dL — ABNORMAL LOW (ref 8.9–10.3)
Chloride: 120 mmol/L — ABNORMAL HIGH (ref 98–111)
Creatinine, Ser: 1.62 mg/dL — ABNORMAL HIGH (ref 0.61–1.24)
GFR, Estimated: 42 mL/min — ABNORMAL LOW (ref >60)
Glucose, Bld: 126 mg/dL — ABNORMAL HIGH (ref 70–99)
Potassium: 3.2 mmol/L — ABNORMAL LOW (ref 3.5–5.1)
Sodium: 145 mmol/L (ref 135–145)
Total Bilirubin: 2 mg/dL — ABNORMAL HIGH (ref 0.3–1.2)
Total Protein: 5 g/dL — ABNORMAL LOW (ref 6.5–8.1)

## 2022-08-11 LAB — GLUCOSE, CAPILLARY
Glucose-Capillary: 107 mg/dL — ABNORMAL HIGH (ref 70–99)
Glucose-Capillary: 118 mg/dL — ABNORMAL HIGH (ref 70–99)
Glucose-Capillary: 121 mg/dL — ABNORMAL HIGH (ref 70–99)
Glucose-Capillary: 121 mg/dL — ABNORMAL HIGH (ref 70–99)
Glucose-Capillary: 134 mg/dL — ABNORMAL HIGH (ref 70–99)
Glucose-Capillary: 134 mg/dL — ABNORMAL HIGH (ref 70–99)
Glucose-Capillary: 139 mg/dL — ABNORMAL HIGH (ref 70–99)

## 2022-08-11 LAB — BPAM PLATELET PHERESIS
Blood Product Expiration Date: 202403312359
Blood Product Expiration Date: 202404022359
ISSUE DATE / TIME: 202403301104
ISSUE DATE / TIME: 202403301233
Unit Type and Rh: 5100
Unit Type and Rh: 6200

## 2022-08-11 LAB — PHOSPHORUS: Phosphorus: 2.9 mg/dL (ref 2.5–4.6)

## 2022-08-11 LAB — MAGNESIUM: Magnesium: 2.1 mg/dL (ref 1.7–2.4)

## 2022-08-11 MED ORDER — TBO-FILGRASTIM 480 MCG/0.8ML ~~LOC~~ SOSY
480.0000 ug | PREFILLED_SYRINGE | Freq: Every day | SUBCUTANEOUS | Status: DC
  Administered 2022-08-11: 480 ug via SUBCUTANEOUS
  Filled 2022-08-11 (×2): qty 0.8

## 2022-08-11 MED ORDER — DEXAMETHASONE 4 MG PO TABS: 4.0000 mg | ORAL_TABLET | Freq: Two times a day (BID) | ORAL | Status: DC

## 2022-08-11 MED ORDER — POTASSIUM CHLORIDE 10 MEQ/50ML IV SOLN
10.0000 meq | INTRAVENOUS | Status: AC
  Administered 2022-08-11 (×4): 10 meq via INTRAVENOUS
  Filled 2022-08-11 (×4): qty 50

## 2022-08-11 MED ORDER — HYDROCORTISONE SOD SUC (PF) 100 MG IJ SOLR
50.0000 mg | Freq: Two times a day (BID) | INTRAMUSCULAR | Status: DC
  Administered 2022-08-12: 50 mg via INTRAVENOUS
  Filled 2022-08-11: qty 1

## 2022-08-11 NOTE — Assessment & Plan Note (Signed)
Baseline creatinine 1.51 from 08/03/22. Renal US negative for hydronephrosis -Following urine output and BMP -Replace electrolytes as indicated -Maintain adequate renal perfusion

## 2022-08-11 NOTE — Assessment & Plan Note (Signed)
-   see septic shock - unclear source, possibly pulm - does have pacer; no other hardware noted - repeated blood cultures for clearance (obtained 3/31)

## 2022-08-11 NOTE — Assessment & Plan Note (Signed)
-   Continue comfort care 

## 2022-08-11 NOTE — Assessment & Plan Note (Signed)
-   follows with oncology - on Isatuximab/Pomolidimide/Dexamethasone

## 2022-08-11 NOTE — Hospital Course (Signed)
Jonathon [name] is an 87 yo male with PMH MM with mets to bone/lung originally admitted to the ICU with weakness. He was treated for septic shock due to Pseudomonas bacteremia with IVF, pressors, and PRBC.  He was followed by palliative care and code status clarified to DNR. He had some medical improvement on treatments and service transferred to [facility] from ICU on 08/11/22.

## 2022-08-11 NOTE — Assessment & Plan Note (Addendum)
-   Difficulty obtaining PRBC given antibodies  -Neutropenic precautions - trend CBC - PLTC goal >10k - Hgb goal > 7g/dL -oncology okay with granix, will initiate until ANC>1500

## 2022-08-11 NOTE — Assessment & Plan Note (Addendum)
-   continue foley; will need TOV soon if doesn't have foley typically outpatient -On Flomax at home.  Will resume once able to take orals safely

## 2022-08-11 NOTE — Assessment & Plan Note (Signed)
-   Resuming Flomax as able

## 2022-08-11 NOTE — Assessment & Plan Note (Addendum)
-   continue comfort care

## 2022-08-11 NOTE — Progress Notes (Addendum)
Progress Note    Jonathon [name]   [id number]  DOB: May 07, 1936  DOA: 07/31/2022     3 PCP: [name], MD  Initial CC: weakness  Hospital Course: Jonathon [name] is an 87 yo male with PMH MM with mets to bone/lung originally admitted to the ICU with weakness. He was treated for septic shock due to Pseudomonas bacteremia with IVF, pressors, and PRBC.  He was followed by palliative care and code status clarified to DNR. He had some medical improvement on treatments and service transferred to [facility] from ICU on 08/11/22.   Interval History:  No events overnight.  Grandson present bedside this morning and also spoke with his daughter on the phone.  Patient was awake, alert, and able to speak easily.  Remains n.p.o. due to not passing swallow evaluation yet.  Energy does seem better overall and clinically he has been improving.  Assessment and Plan: * Septic shock (HCC)-resolved as of 08/11/2022 - Treated with vasopressors and ICU due to refractory hypotension - See Pseudomonas bacteremia as well - Continue cefepime.  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 Renal US negative for hydronephrosis -Following urine  output and BMP -Replace electrolytes as indicated -Maintain adequate renal perfusion  Paroxysmal atrial fibrillation (HCC) - Eliquis on hold in setting of severe thrombocytopenia on admission -Also n.p.o. until passes swallow eval -Continue holding flecanide, Cardizem, metoprolol  Dementia (HCC) - mentation stable and back to baseline per family  Urinary retention - continue foley; will need TOV soon if doesn't have foley typically outpatient -On Flomax at home.  Will resume once able to take orals safely  BPH (benign prostatic hyperplasia) - Resuming Flomax as able  Essential hypertension - BP has recovered, will resume home regimen as able   Old records reviewed in assessment of this patient  Antimicrobials: Cefepime 3/28 >> current   DVT prophylaxis:  SCDs Start: 08/04/2022 1511   Code Status:   Code Status: DNR  Mobility Assessment (last 72 hours)     Mobility Assessment     Row Name 08/09/22 2000 08/09/22 0800 08/08/22 2000       Does patient have an order for bedrest or is patient medically unstable No - Continue assessment No - Continue assessment No - Continue assessment     What is the highest level of mobility based on the progressive mobility assessment? Level 1 (Bedfast) - Unable to balance while sitting on edge of bed Level 1 (Bedfast) - Unable to balance while sitting on edge of bed Level 2 (Chairfast) - Balance while sitting on edge of bed and cannot stand     Is the above level different from baseline mobility prior to current illness? No - Consider discontinuing PT/OT No - Consider discontinuing PT/OT Yes - Recommend PT order  Barriers to discharge:  Disposition Plan:  Pending PT evals Status is: Inpt  Objective: Blood pressure (!) 148/80, pulse (!) 112, temperature 98.5 F (36.9 C), temperature source Axillary, resp. rate (!) 25, height 5\' 10"  (1.778 m), weight 88.4 kg, SpO2 100 %.  Examination:  Physical Exam Constitutional:       General: He is not in acute distress.    Appearance: Normal appearance.  HENT:     Head: Normocephalic and atraumatic.     Mouth/Throat:     Mouth: Mucous membranes are moist.  Eyes:     Extraocular Movements: Extraocular movements intact.  Cardiovascular:     Rate and Rhythm: Regular rhythm. Tachycardia present.  Pulmonary:     Effort: Pulmonary effort is normal. No respiratory distress.     Breath sounds: Normal breath sounds. No wheezing.  Abdominal:     General: Bowel sounds are normal. There is no distension.     Palpations: Abdomen is soft.     Tenderness: There is no abdominal tenderness.  Musculoskeletal:        General: Normal range of motion.     Cervical back: Normal range of motion and neck supple.  Skin:    General: Skin is warm and dry.  Neurological:     General: No focal deficit present.     Mental Status: He is alert.  Psychiatric:        Mood and Affect: Mood normal.        Behavior: Behavior normal.      Consultants:  Palliative care  Procedures:    Data Reviewed: Results for orders placed or performed during the hospital encounter of 07/28/2022 (from the past 24 hour(s))  Glucose, capillary     Status: Abnormal   Collection Time: 08/10/22 11:18 AM  Result Value Ref Range   Glucose-Capillary 139 (H) 70 - 99 mg/dL  Glucose, capillary     Status: Abnormal   Collection Time: 08/10/22  3:09 PM  Result Value Ref Range   Glucose-Capillary 162 (H) 70 - 99 mg/dL  Glucose, capillary     Status: Abnormal   Collection Time: 08/10/22  7:53 PM  Result Value Ref Range   Glucose-Capillary 155 (H) 70 - 99 mg/dL  Glucose, capillary     Status: Abnormal   Collection Time: 08/10/22 11:36 PM  Result Value Ref Range   Glucose-Capillary 121 (H) 70 - 99 mg/dL  Glucose, capillary     Status: Abnormal   Collection Time: 08/11/22  4:35 AM  Result Value Ref Range   Glucose-Capillary 118 (H) 70 - 99 mg/dL  CBC     Status: Abnormal   Collection Time: 08/11/22  5:17  AM  Result Value Ref Range   WBC 0.1 (LL) 4.0 - 10.5 K/uL   RBC 2.49 (L) 4.22 - 5.81 MIL/uL   Hemoglobin 7.0 (L) 13.0 - 17.0 g/dL   HCT 20.8 (L) 39.0 - 52.0 %   MCV 83.5 80.0 - 100.0 fL   MCH 28.1 26.0 - 34.0 pg   MCHC 33.7 30.0 - 36.0 g/dL   RDW 19.2 (H) 11.5 - 15.5 %   Platelets 24 (LL) 150 - 400 K/uL   nRBC 16.7 (H) 0.0 - 0.2 %  Comprehensive metabolic panel     Status: Abnormal   Collection Time: 08/11/22  5:17 AM  Result Value Ref Range   Sodium 145 135 - 145 mmol/L   Potassium 3.2 (L) 3.5 - 5.1 mmol/L   Chloride 120 (H) 98 -  111 mmol/L   CO2 14 (L) 22 - 32 mmol/L   Glucose, Bld 126 (H) 70 - 99 mg/dL   BUN 50 (H) 8 - 23 mg/dL   Creatinine, Ser 1.62 (H) 0.61 - 1.24 mg/dL   Calcium 6.7 (L) 8.9 - 10.3 mg/dL   Total Protein 5.0 (L) 6.5 - 8.1 g/dL   Albumin 2.2 (L) 3.5 - 5.0 g/dL   AST 20 15 - 41 U/L   ALT 49 (H) 0 - 44 U/L   Alkaline Phosphatase 54 38 - 126 U/L   Total Bilirubin 2.0 (H) 0.3 - 1.2 mg/dL   GFR, Estimated 42 (L) >60 mL/min   Anion gap 11 5 - 15  Magnesium     Status: None   Collection Time: 08/11/22  5:17 AM  Result Value Ref Range   Magnesium 2.1 1.7 - 2.4 mg/dL  Phosphorus     Status: None   Collection Time: 08/11/22  5:17 AM  Result Value Ref Range   Phosphorus 2.9 2.5 - 4.6 mg/dL  Glucose, capillary     Status: Abnormal   Collection Time: 08/11/22  7:41 AM  Result Value Ref Range   Glucose-Capillary 134 (H) 70 - 99 mg/dL   Comment 1 Notify RN    *Note: Due to a large number of results and/or encounters for the requested time period, some results have not been displayed. A complete set of results can be found in Results Review.    I have reviewed pertinent nursing notes, vitals, labs, and images as necessary. I have ordered labwork to follow up on as indicated.  I have reviewed the last notes from staff over past 24 hours. I have discussed patient's care plan and test results with nursing staff, CM/SW, and other staff as appropriate.  Time spent:  Greater than 50% of the 55 minute visit was spent in counseling/coordination of care for the patient as laid out in the A&P.   LOS: 3 days   Dwyane Dee, MD [facility] 08/11/2022, 11:02 AM

## 2022-08-11 NOTE — Assessment & Plan Note (Addendum)
-   CT head negative - etiology considered due to septic shock, now resolved

## 2022-08-11 NOTE — Progress Notes (Addendum)
Speech Language Pathology Treatment: Dysphagia  Patient Details Name: Jonathon [name] MRN: [name] DOB: Sep 08, 1935 Today's Date: 08/11/2022 Time: 1801-1830 SLP Time Calculation (min) (ACUTE ONLY): 29 min  Assessment / Plan / Recommendation Clinical Impression  Patient seen today for skilled SLP to determine readiness for p.o. diet.  Patient was alert and cooperative during exam, his daughter and son-in-law were present throughout the session.  Upon oral exam patient appeared with blood-tinged dried secretions in posterior oral cavity.  SLP set up oral suction and provided extensive oral care without adequate clearance as patient appears with slightly sensitive gag.  Patient's voice is Hydro phonic with congested breathing heard at pharynx/larynx.Marland Kitchen  He makes great efforts to cough and expectorate but is unable to clear secretions and this exhausts him.    SLP provided patient with single half teaspoon of [place] ice after oral care.  Attempt at swallow noted without full labial closure and patient demonstrating immediate inhalation with halting breath and worsening dyspnea.  SLP suctioned a slightly deeper x 2 clearing small amount of white secretions.    Daughter [name] reports patient last consumed something on Wednesday.  She denies patient having issues with congestion or dysphagia prior to admission.  At this time Mr. Zahorik is not ready for p.o. nor a modified barium swallow study.    Recommend n.p.o. with aggressive oral care as he can tolerate, moisture via Toothette encouraging patient to swallow with labial closure and head of bed not below 30 degrees if patient can tolerate this posture.  Suspect if patient is medically able to tolerate short-term feeding tube may be indicated if this aligns with patient goals.  Patient's current dysphagia (question secondary to chemo), and gross weakness at this time prohibit him from adequacy of p.o. intake with adequate airway protection.    Encouraged  patient to swallow his saliva is much as possible and advised family and patient to goals including secretion management and swallowing with adequate labial closure to determine readiness for further evaluation.  Prior to initiating p.o. intake due to concern for gross pharyngeal weakness, MBS is advised again if aligns with goals.  Educated patient his daughter and son-in-law to recommendations, informed RN of findings and posted swallow precautions signs.      HPI HPI: Jonathon [name] is a 87 y.o. male with medical history significant for multiple myeloma with bone metastases and polyneuropathy, paroxysmal atrial fibrillation on Eliquis, being admitted to the hospital for weakness and cytopenias.  I was called by Dr. [name] his covering oncologist, as there was concern for infections significant weakness since he lives alone, he was also noted on blood counts today to show significant cytopenia decline from his prior treatment 2 weeks ago, today with white blood cell count 2.0, hemoglobin 9.7, and platelets of 21.  At baseline, the patient is ambulatory and functional, living by himself, unfortunately today he was found to be unable to stand unassisted.  History is provided by the patient who seems to be slightly confused, and his daughter who is with him today at the bedside.  Of note, he was treated as an outpatient for a possible urinary tract infection was found to have urinary retention recently, and is currently on a Foley catheter with plans to see urology as an outpatient.  Patient and daughter deny any chest pain, shortness of breath, nausea, vomiting, fevers, chills.     The patient was directly admitted from the oncology center, according to nursing staff he had a vasovagal event  when standing to be transferred in the room.  He did not fall, regained consciousness very quickly and is alert and oriented x 4.  Of note, the patient does live alone with some home health.  His daughter is closely involved  in his care.CXR 3/29 Compared to prior exam there are new hazy airspace opacities in the  left mid and lower lung fields, suspicious for infection ; ST consulted for BSE to assess swallow function.      SLP Plan  Continue with current plan of care;MBS (mbs when pt clinically judged to be ready)      Recommendations for follow up therapy are one component of a multi-disciplinary discharge planning process, led by the attending physician.  Recommendations may be updated based on patient status, additional functional criteria and insurance authorization.    Recommendations  Diet recommendations: NPO (moisture via toothette) Medication Administration: Via alternative means                  Oral care QID;Staff/trained caregiver to provide oral care     Dysphagia, oropharyngeal phase (R13.12)     Continue with current plan of care;MBS (mbs when pt clinically judged to be ready)   Kathleen Lime, MS [place] [facility] [phone]   Macario Golds  08/11/2022, 6:57 PM

## 2022-08-11 NOTE — Assessment & Plan Note (Addendum)
-   Treated with vasopressors and ICU due to refractory hypotension - See Pseudomonas bacteremia as well -Patient transitioned to comfort care on 08/12/2022

## 2022-08-11 NOTE — Assessment & Plan Note (Signed)
-   mentation stable and back to baseline per family

## 2022-08-12 DIAGNOSIS — Z515 Encounter for palliative care: Secondary | ICD-10-CM

## 2022-08-12 DIAGNOSIS — R7881 Bacteremia: Secondary | ICD-10-CM | POA: Diagnosis not present

## 2022-08-12 DIAGNOSIS — G9341 Metabolic encephalopathy: Secondary | ICD-10-CM | POA: Diagnosis not present

## 2022-08-12 DIAGNOSIS — M7989 Other specified soft tissue disorders: Secondary | ICD-10-CM

## 2022-08-12 DIAGNOSIS — N179 Acute kidney failure, unspecified: Secondary | ICD-10-CM | POA: Diagnosis not present

## 2022-08-12 DIAGNOSIS — D709 Neutropenia, unspecified: Secondary | ICD-10-CM

## 2022-08-12 DIAGNOSIS — D759 Disease of blood and blood-forming organs, unspecified: Secondary | ICD-10-CM | POA: Diagnosis not present

## 2022-08-12 DIAGNOSIS — E870 Hyperosmolality and hypernatremia: Secondary | ICD-10-CM

## 2022-08-12 LAB — GLUCOSE, CAPILLARY
Glucose-Capillary: 137 mg/dL — ABNORMAL HIGH (ref 70–99)
Glucose-Capillary: 156 mg/dL — ABNORMAL HIGH (ref 70–99)
Glucose-Capillary: 146 mg/dL — ABNORMAL HIGH (ref 70–99)

## 2022-08-12 LAB — BASIC METABOLIC PANEL
Anion gap: 15 (ref 5–15)
BUN: 67 mg/dL — ABNORMAL HIGH (ref 8–23)
CO2: 9 mmol/L — ABNORMAL LOW (ref 22–32)
Calcium: 7 mg/dL — ABNORMAL LOW (ref 8.9–10.3)
Chloride: 126 mmol/L — ABNORMAL HIGH (ref 98–111)
Creatinine, Ser: 2.09 mg/dL — ABNORMAL HIGH (ref 0.61–1.24)
GFR, Estimated: 31 mL/min — ABNORMAL LOW (ref >60)
Glucose, Bld: 163 mg/dL — ABNORMAL HIGH (ref 70–99)
Potassium: 4 mmol/L (ref 3.5–5.1)
Sodium: 150 mmol/L — ABNORMAL HIGH (ref 135–145)

## 2022-08-12 LAB — BPAM RBC
Blood Product Expiration Date: 202404072359
Blood Product Expiration Date: 202404252359
Blood Product Expiration Date: 202404252359
Blood Product Expiration Date: 202405042359
ISSUE DATE / TIME: 202403280917
ISSUE DATE / TIME: 202403281306
Unit Type and Rh: 5100
Unit Type and Rh: 7300
Unit Type and Rh: 7300
Unit Type and Rh: 7300

## 2022-08-12 LAB — TYPE AND SCREEN
ABO/RH(D): B POS
Antibody Screen: POSITIVE
Donor AG Type: NEGATIVE
Unit division: 0
Unit division: 0
Unit division: 0
Unit division: 0

## 2022-08-12 LAB — CBC WITH DIFFERENTIAL/PLATELET
Abs Immature Granulocytes: 0 10*3/uL (ref 0.00–0.07)
Basophils Absolute: 0 10*3/uL (ref 0.0–0.1)
Basophils Relative: 0 %
Eosinophils Absolute: 0 10*3/uL (ref 0.0–0.5)
Eosinophils Relative: 0 %
HCT: 22.3 % — ABNORMAL LOW (ref 39.0–52.0)
Hemoglobin: 7.6 g/dL — ABNORMAL LOW (ref 13.0–17.0)
Immature Granulocytes: 0 %
Lymphocytes Relative: 12 %
Lymphs Abs: 0 10*3/uL — ABNORMAL LOW (ref 0.7–4.0)
MCH: 27.8 pg (ref 26.0–34.0)
MCHC: 34.1 g/dL (ref 30.0–36.0)
MCV: 81.7 fL (ref 80.0–100.0)
Monocytes Absolute: 0 10*3/uL — ABNORMAL LOW (ref 0.1–1.0)
Monocytes Relative: 6 %
Neutro Abs: 0.1 10*3/uL — CL (ref 1.7–7.7)
Neutrophils Relative %: 82 %
Platelets: 16 10*3/uL — CL (ref 150–400)
RBC: 2.73 MIL/uL — ABNORMAL LOW (ref 4.22–5.81)
RDW: 18.9 % — ABNORMAL HIGH (ref 11.5–15.5)
WBC: 0.2 10*3/uL — CL (ref 4.0–10.5)
nRBC: 17.6 % — ABNORMAL HIGH (ref 0.0–0.2)

## 2022-08-12 LAB — MAGNESIUM: Magnesium: 2.3 mg/dL (ref 1.7–2.4)

## 2022-08-12 MED ORDER — MORPHINE SULFATE (PF) 2 MG/ML IV SOLN: 2.0000 mg | INTRAVENOUS | Status: DC | PRN

## 2022-08-12 MED ORDER — LORAZEPAM 1 MG PO TABS: 1.0000 mg | ORAL_TABLET | ORAL | Status: DC | PRN

## 2022-08-12 MED ORDER — HALOPERIDOL LACTATE 5 MG/ML IJ SOLN
0.5000 mg | INTRAMUSCULAR | Status: DC | PRN
Start: 2022-08-12 — End: 2022-08-12

## 2022-08-12 MED ORDER — FLECAINIDE ACETATE 50 MG PO TABS
75.0000 mg | ORAL_TABLET | Freq: Two times a day (BID) | ORAL | Status: DC
  Filled 2022-08-12: qty 2

## 2022-08-12 MED ORDER — HALOPERIDOL LACTATE 5 MG/ML IJ SOLN: 1.0000 mg | INTRAMUSCULAR | Status: DC | PRN

## 2022-08-12 MED ORDER — DEXTROSE 5 % IV SOLN: INTRAVENOUS | Status: DC

## 2022-08-12 MED ORDER — BIOTENE DRY MOUTH MT LIQD: 15.0000 mL | OROMUCOSAL | Status: DC | PRN

## 2022-08-12 MED ORDER — METOPROLOL TARTRATE 25 MG/10 ML ORAL SUSPENSION
50.0000 mg | Freq: Two times a day (BID) | ORAL | Status: DC
  Filled 2022-08-12: qty 20

## 2022-08-12 MED ORDER — MORPHINE SULFATE (PF) 2 MG/ML IV SOLN
2.0000 mg | Freq: Once | INTRAVENOUS | Status: AC
  Administered 2022-08-12: 2 mg via INTRAVENOUS
  Filled 2022-08-12: qty 1

## 2022-08-12 MED ORDER — HYDROMORPHONE HCL-NACL 50-0.9 MG/50ML-% IV SOLN
0.5000 mg/h | INTRAVENOUS | Status: DC
  Administered 2022-08-12: 0.3 mg/h via INTRAVENOUS
  Administered 2022-08-14: 0.5 mg/h via INTRAVENOUS
  Filled 2022-08-12 (×2): qty 50

## 2022-08-12 MED ORDER — GLYCOPYRROLATE 0.2 MG/ML IJ SOLN
0.2000 mg | INTRAMUSCULAR | Status: DC | PRN
  Administered 2022-08-13 – 2022-08-14 (×5): 0.2 mg via INTRAVENOUS
  Filled 2022-08-12 (×7): qty 1

## 2022-08-12 MED ORDER — DILTIAZEM HCL 25 MG/5ML IV SOLN
20.0000 mg | Freq: Once | INTRAVENOUS | Status: AC
  Administered 2022-08-12: 20 mg via INTRAVENOUS
  Filled 2022-08-12: qty 5

## 2022-08-12 MED ORDER — APIXABAN 5 MG PO TABS: 5.0000 mg | ORAL_TABLET | Freq: Two times a day (BID) | ORAL | Status: DC

## 2022-08-12 MED ORDER — POLYVINYL ALCOHOL 1.4 % OP SOLN: 1.0000 [drp] | Freq: Four times a day (QID) | OPHTHALMIC | Status: DC | PRN

## 2022-08-12 MED ORDER — HYDROMORPHONE HCL 1 MG/ML IJ SOLN
INTRAMUSCULAR | Status: AC
  Filled 2022-08-12: qty 0.5

## 2022-08-12 MED ORDER — METOPROLOL TARTRATE 5 MG/5ML IV SOLN
5.0000 mg | INTRAVENOUS | Status: DC | PRN
  Administered 2022-08-12: 5 mg via INTRAVENOUS
  Filled 2022-08-12: qty 5

## 2022-08-12 MED ORDER — DILTIAZEM HCL 25 MG/5ML IV SOLN
20.0000 mg | Freq: Four times a day (QID) | INTRAVENOUS | Status: DC
  Filled 2022-08-12 (×2): qty 5

## 2022-08-12 MED ORDER — HYDROMORPHONE BOLUS VIA INFUSION
0.5000 mg | INTRAVENOUS | Status: DC | PRN
  Administered 2022-08-12 – 2022-08-14 (×14): 0.5 mg via INTRAVENOUS

## 2022-08-12 MED ORDER — SODIUM CHLORIDE 0.9 % IV SOLN: 2.0000 g | INTRAVENOUS | Status: DC

## 2022-08-12 MED ORDER — ONDANSETRON HCL 4 MG/2ML IJ SOLN: 4.0000 mg | Freq: Four times a day (QID) | INTRAMUSCULAR | Status: DC | PRN

## 2022-08-12 MED ORDER — HYDROMORPHONE HCL 1 MG/ML IJ SOLN
0.5000 mg | Freq: Once | INTRAMUSCULAR | Status: AC
  Administered 2022-08-12: 0.5 mg via INTRAVENOUS

## 2022-08-12 NOTE — Progress Notes (Signed)
Pharmacy Antibiotic Note  Jonathon [name] is a 87 y.o. male multiple myeloma with mets to bone on chemotherapy PTA who presented to the ED on 07/20/2022  after he was found to have severe weakness and pancytopenia at his office visit with Dr. [name].  He was started on broad spectrum abx on admission for sepsis.  Blood culture collected on 3/27/274 resumed back with PsA.  He's currently on cefepime for infection.  Today, 08/12/2022: - day #5 abx - ANC 0.1 (on Granix) - Tmax 100 - scr trending up (2.09, crcl~29)  Plan: - adjust cefepime to 2gm IV q24h  ________________________________________________  Height: 5\' 10"  (177.8 cm) Weight: 88.4 kg (194 lb 14.2 oz) IBW/kg (Calculated) : 73  Temp (24hrs), Avg:99 F (37.2 C), Min:98.4 F (36.9 C), Max:100 F (37.8 C)  Recent Labs  Lab 08/08/22 0334 08/08/22 0431 08/08/22 0604 08/08/22 1643 08/09/22 0354 08/09/22 1832 08/10/22 0337 08/11/22 0517 08/12/22 0452  WBC 1.2*  --    < > 0.6* 0.4*  --  0.1* 0.1* 0.2*  CREATININE 2.58* 2.65*  --   --  1.55*  --  1.38* 1.62* 2.09*  LATICACIDVEN 4.1*  --   --   --  4.2* 2.8*  --   --   --    < > = values in this interval not displayed.    Estimated Creatinine Clearance: 29.5 mL/min (A) (by C-G formula based on SCr of 2.09 mg/dL (H)).    Allergies  Allergen Reactions   Lexapro [Escitalopram Oxalate]     3/30-08/16/17 ? Serotonin Syndrome    3/28 Ceftriaxone x 1 3/28 Cefepime >>   3/28 Vancomycin >> 3/28 3/28 Azithro >>3/30   3/27 BCx: 2/4 PsA (Pans sens) 3/27 UCx:   ngf 3/28 MRSA PCR:  neg 3/31 bcx x2:   Thank you for allowing pharmacy to be a part of this patient's care.  Lynelle Doctor 08/12/2022 9:39 AM

## 2022-08-12 NOTE — Progress Notes (Signed)
Progress Note    Jonathon [name]   [id number]  DOB: 03-07-1936  DOA: 07/13/2022     4 PCP: [name], MD  Initial CC: weakness  Hospital Course: Jonathon [name] is an 87 yo male with PMH MM with mets to bone/lung originally admitted to the ICU with weakness. He was treated for septic shock due to Pseudomonas bacteremia with IVF, pressors, and PRBC.  He was followed by palliative care and code status clarified to DNR. He had some medical improvement on treatments and service transferred to [facility] from ICU on 08/11/22.   Interval History:  No events overnight.  This morning, patient was noted by staff to be tachycardic, tachypneic, and using accessory muscles.  He responded fairly well to morphine and Cardizem.  Also discussed with his daughter his recent failed repeat swallow evaluation.  They are amenable with placement of cortrak at this time and starting tube feeds.  Assessment and Plan: * Septic shock-resolved as of 08/11/2022 - Treated with vasopressors and ICU due to refractory hypotension - See Pseudomonas bacteremia as well - Continue cefepime.  Repeating cultures for clearance in setting of immunosuppression and presence of pacemaker -Tapering stress dose steroids back to home Decadron  Bacteremia due to Pseudomonas - see septic shock - unclear source, possibly pulm - does have pacer; no other hardware noted - repeat blood cultures for clearance (obtained [id number])  Acute metabolic encephalopathy-resolved as of 08/11/2022 - CT head negative - etiology considered due to septic shock, now resolved  Severe neutropenia - see cytopenia as well - continue neutropenic precautions - continue granix until ANC > 1500 - ANC 164 today   Hypernatremia - due to free water deficit and dehydration from poor intake - start D5W and trend BMP  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 Renal US negative for hydronephrosis - no PO intake due to failing SLP evals, likely further dehydration - start on IVF and trend renal fxn  Paroxysmal atrial fibrillation - Eliquis on hold in setting of severe thrombocytopenia on admission -Also n.p.o. until passes swallow eval - cortrak being pursued; will resume home meds once placed  Dementia - mentation stable and back to baseline per family  Redness and swelling of upper arm - Both arms noted with some edema however right arm has some erythema near [facility] fossa.  RUE duplex negative for DVT nor SVT performed on 08/10/2022 -Suspect severe hypoalbuminemia contributing to some third spacing and edema  Urinary retention - continue foley; will need TOV soon if doesn't have foley typically outpatient -On Flomax at home.  Will resume once able to take orals safely  Goals of care, counseling/discussion - Evaluated by palliative care.  Currently DNR.  Daughter understands guarded prognosis at this time and patient has been doing poorly over the past 1 month leading up to hospitalization and not tolerating outpatient treatments as well as hoped - Plan for now is to be medically aggressive and hopeful for further clinical improvement - appreciate palliative care continuing to assist until patient more stable   BPH (benign prostatic hyperplasia) - Resuming Flomax as able  Essential hypertension - BP has recovered, will resume home regimen as able   Old records reviewed in assessment  of this patient  Antimicrobials: Cefepime 3/28 >> current   DVT prophylaxis:  SCDs Start: 08/06/2022 1511   Code Status:   Code Status: DNR  Mobility Assessment (last 72 hours)     Mobility Assessment     Row Name 08/09/22 2000            Does patient have an order for bedrest or is patient medically unstable No - Continue assessment       What is the highest level of mobility based on the progressive mobility assessment? Level 1 (Bedfast) - Unable to balance while sitting on edge of bed       Is the above level different from baseline mobility prior to current illness? No - Consider discontinuing PT/OT                Barriers to discharge:  Disposition Plan:  Pending PT evals Status is: Inpt  Objective: Blood pressure 129/81, pulse (!) 111, temperature 98.8 F (37.1 C), temperature source Axillary, resp. rate (!) 29, height 5\' 10"  (1.778 m), weight 88.4 kg, SpO2 100 %.  Examination:  Physical Exam Constitutional:      General: He is not in acute distress.    Appearance: Normal appearance.     Comments: Frail and deconditioned appearing  HENT:     Head: Normocephalic and atraumatic.     Mouth/Throat:     Mouth: Mucous membranes are moist.  Eyes:     Extraocular Movements: Extraocular movements intact.  Cardiovascular:     Rate and Rhythm: Regular rhythm. Tachycardia present.  Pulmonary:     Effort: Pulmonary effort is normal. No respiratory distress.     Breath sounds: Normal breath sounds. No wheezing.  Abdominal:     General: Bowel sounds are normal. There is no distension.     Palpations: Abdomen is soft.     Tenderness: There is no abdominal tenderness.  Musculoskeletal:        General: Swelling (B/L UE) present. Normal range of motion.     Cervical back: Normal range of motion and neck supple.  Skin:    General: Skin is warm and dry.     Comments: Erythema noted in R forearm  Neurological:     General: No focal deficit present.     Mental Status: He is alert.  Psychiatric:        Mood and Affect: Mood normal.        Behavior: Behavior normal.      Consultants:  Palliative care  Procedures:    Data Reviewed: Results for orders placed or performed during the hospital  encounter of 08/04/2022 (from the past 24 hour(s))  Glucose, capillary     Status: Abnormal   Collection Time: 08/11/22  3:30 PM  Result Value Ref Range   Glucose-Capillary 107 (H) 70 - 99 mg/dL  Glucose, capillary     Status: Abnormal   Collection Time: 08/11/22  8:05 PM  Result Value Ref Range   Glucose-Capillary 121 (H) 70 - 99 mg/dL  Glucose, capillary     Status: Abnormal   Collection Time: 08/11/22 11:41 PM  Result Value Ref Range   Glucose-Capillary 134 (H) 70 - 99 mg/dL  Glucose, capillary     Status: Abnormal   Collection Time: 08/12/22  3:43 AM  Result Value Ref Range   Glucose-Capillary 137 (H) 70 - 99 mg/dL  Basic metabolic panel     Status: Abnormal   Collection Time: 08/12/22  4:52 AM  Result  Value Ref Range   Sodium 150 (H) 135 - 145 mmol/L   Potassium 4.0 3.5 - 5.1 mmol/L   Chloride 126 (H) 98 - 111 mmol/L   CO2 9 (L) 22 - 32 mmol/L   Glucose, Bld 163 (H) 70 - 99 mg/dL   BUN 67 (H) 8 - 23 mg/dL   Creatinine, Ser 2.09 (H) 0.61 - 1.24 mg/dL   Calcium 7.0 (L) 8.9 - 10.3 mg/dL   GFR, Estimated 31 (L) >60 mL/min   Anion gap 15 5 - 15  CBC with Differential/Platelet     Status: Abnormal   Collection Time: 08/12/22  4:52 AM  Result Value Ref Range   WBC 0.2 (LL) 4.0 - 10.5 K/uL   RBC 2.73 (L) 4.22 - 5.81 MIL/uL   Hemoglobin 7.6 (L) 13.0 - 17.0 g/dL   HCT 22.3 (L) 39.0 - 52.0 %   MCV 81.7 80.0 - 100.0 fL   MCH 27.8 26.0 - 34.0 pg   MCHC 34.1 30.0 - 36.0 g/dL   RDW 18.9 (H) 11.5 - 15.5 %   Platelets 16 (LL) 150 - 400 K/uL   nRBC 17.6 (H) 0.0 - 0.2 %   Neutrophils Relative % 82 %   Neutro Abs 0.1 (LL) 1.7 - 7.7 K/uL   Lymphocytes Relative 12 %   Lymphs Abs 0.0 (L) 0.7 - 4.0 K/uL   Monocytes Relative 6 %   Monocytes Absolute 0.0 (L) 0.1 - 1.0 K/uL   Eosinophils Relative 0 %   Eosinophils Absolute 0.0 0.0 - 0.5 K/uL   Basophils Relative 0 %   Basophils Absolute 0.0 0.0 - 0.1 K/uL   WBC Morphology DOHLE BODIES    Immature Granulocytes 0 %   Abs Immature  Granulocytes 0.00 0.00 - 0.07 K/uL   Polychromasia PRESENT    Target Cells PRESENT   Magnesium     Status: None   Collection Time: 08/12/22  4:52 AM  Result Value Ref Range   Magnesium 2.3 1.7 - 2.4 mg/dL  Glucose, capillary     Status: Abnormal   Collection Time: 08/12/22  7:16 AM  Result Value Ref Range   Glucose-Capillary 156 (H) 70 - 99 mg/dL  Glucose, capillary     Status: Abnormal   Collection Time: 08/12/22 11:49 AM  Result Value Ref Range   Glucose-Capillary 146 (H) 70 - 99 mg/dL   *Note: Due to a large number of results and/or encounters for the requested time period, some results have not been displayed. A complete set of results can be found in Results Review.    I have reviewed pertinent nursing notes, vitals, labs, and images as necessary. I have ordered labwork to follow up on as indicated.  I have reviewed the last notes from staff over past 24 hours. I have discussed patient's care plan and test results with nursing staff, CM/SW, and other staff as appropriate.  Time spent: Greater than 50% of the 55 minute visit was spent in counseling/coordination of care for the patient as laid out in the A&P.   LOS: 4 days   Dwyane Dee, MD [facility] 08/12/2022, 1:03 PM

## 2022-08-12 NOTE — Progress Notes (Addendum)
Daily Progress Note   Patient Name: Jonathon [name]       Date: 08/12/2022 DOB: 1935-10-08  Age: 87 y.o. MRN#: [name] Attending Physician: Jonathon Dee, MD Primary Care Physician: Jonathon Ruddy, MD Admit Date: 07/24/2022 Length of Stay: 4 days  Reason for Consultation/Follow-up: Establishing goals of care  Subjective:   CC: Patient with agonal breathing noted.  Spoke with daughter over the phone to discuss care.  Following up regarding complex medical decision making.   Subjective:  Reviewed EMR prior to presenting to bedside.  Patient's medical status has acutely deteriorated.  Patient's kidney function worsening.  Patient hypernatremic.  Patient's platelets remain low.  Patient n.p.o. due to aspiration risk.  Discussed care with bedside RN prior to seeing patient.  Into the bedside to see patient.  Patient's daughter was not at bedside though other family present.  Introduced myself though family remembers meeting with the palliative team previously.  Patient seen lying in bed with signs of agonal breathing.  Respiratory  rate over 30 at times.  Patient open mouth breathing and appears in distress.  Patient will not speak though will minimally move eyes and head to voice. Spent time discussing care with family at bedside about patient's medical deterioration.  Noted would reach out to patient's daughter, Jonathon [name], to discuss medical care moving forward.  Plan had been for patient to get feeding tube today though based on his current assessment, worried that would be causing more harm than benefit.  ------------------------------------------------------------------------------------------------------------- Advance Care Planning Conversation  Pertinent diagnosis: relapsed multiple myeloma with metastatic disease to bone and lung , bacteremia due to Pseudomonas, concerns for aspiration, severe neutropenia, hypernatremia, AKI  The patient and/or family consented to a voluntary [place]. Individuals present for the conversation: Patient unable to participate in complex medical decision-making due to current medical status.  Spoke with patient's next of kin/daughter Jonathon [name] via phone. Palliative provider present for entirety of conversation.   Summary of the conversation:  Able to then call patient's daughter, Jonathon [name].  Reintroduced myself as a member of the palliative medicine team.  Jonathon [name] remembers talking to this provider previously.  Able to update Jonathon [name] regarding medical status when had seen patient.  Stable hide known plan was for feeding tube placement today.  Discussed that based on patient's current medical status, afraid feeding tube would not change the overall outcome at this time.  Expressed concern that patient's body is very tired and appears to be shutting down.  Several appropriately tearful on the phone.  Jonathon [name] acknowledged that her father is tired as he had said this to her over the weekend as well.  Again we had discussed that maintaining patient's dignity and independence was incredibly important to him.  Discussed that at this time, would recommend transition to comfort focused care to allow for dignity and symptom management at the end of life as patient appears acutely uncomfortable due to increased respiratory rate and signs of pain.  Discussed discontinuing interventions such as antibiotics, imaging, and lab work and instead providing medications for pain, dyspnea, and agitation.  Jonathon [name] agreeing with transition to comfort focused care at this time. Will be coming in shortly.  Encouraged that she reach out to any family members that needed to visit.  Expressed worry that based on patient's current analysis, could be hours to days.  Recommended that patient be started on continuous opioid infusion due to his work of breathing and signs of pain.  Jonathon [name] agreeing with this  plan.  Discussed should patient stabilize, may need to consider inpatient  hospice though at this time patient will remain in the hospital.  Jonathon [name] agreeing with this plan.  Provided emotional support via active listening.  [name] for all she has done for her father as she truly shows how much she loves him.  Acknowledges that what ever the plan moving forward, it is in God's hands and we will make sure that the patient does not suffer should he be at the end of his life.  Several agreement with this.  Outcome of the conversations and/or documents completed: Transition to comfort focused care at this time.  I spent 28 minutes providing separately identifiable ACP services with the patient and/or surrogate decision maker in a voluntary, in-person conversation discussing the patient's wishes and goals as detailed in the above note.  Jonathon Aus, DO Palliative Care Provider  -------------------------------------------------------------------------------------------------------------  Updated care team regarding transition to comfort focused care at this time.  Went back to bedside to visit with patient again.  Informed patient of conversation was stable.  Though patient is minimally interactive patient could not his yes agreeing that he was tired and did not want to suffer anymore.  Informed him we would be giving him medications for comfort and he nodded his head yes to agree with this.  Noted several would be up to visit him along with other family members.  Objective:   Vital Signs:  BP (!) 155/91 (BP Location: Left Arm)   Pulse 70   Temp 98.8 F (37.1 C) (Axillary)   Resp 16   Ht 5\' 10"  (1.778 m)   Wt 88.4 kg   SpO2 97%   BMI 27.96 kg/m   Physical Exam: General: laying in bed, appears acutely ill, agonal breathing at times Eyes: No drainage noted HENT: Open mouth breathing, dry mucous membranes Cardiovascular: Tachycardia noted Respiratory: Increased work of breathing noted with accessory muscles use, respiratory rate over 30 at times Abdomen:  distended Extremities: Edema present in all extremities from third spacing Skin: Ecchymoses present on the right extremity Neuro: Lethargic, minimally interactive to voice  Imaging: I personally reviewed recent imaging.   Assessment & Plan:   Assessment: Patient is an 87 year old male with a past medical history of tobacco use and relapsed multiple myeloma with metastatic disease to bone and lung who was admitted on 08/08/2022 from the cancer center for management of weakness and cytopenia. Patient transferred to the ICU once developed worsening mental status, hypotension, and fever. Since being admitted, patient has been receiving aggressive medical management for septic shock secondary to pseudomonal bacteremia with neutropenic fever. Patient also receiving support for pancytopenia in the setting of chemotherapy and multiple myeloma. Patient also receiving support for AKI from ATN in the setting of sepsis. Palliative medicine team consulted to assist with complex medical decision making.   Recommendations/Plan: # Complex medical decision making/goals of care:  - Patient unable to participate in complex medical decision-making at this time due to medical status.  -Spoke with patient's NOK/daughter, [name], extensively as described above in HPI.  Patient has acutely medically deteriorated.  Discussed with Jonathon [name] concerned that patient's body is "tired" and he is reaching the end of his life.  Jonathon [name] agreed with this noting that patient himself had told her he is tired now.  Simple agreeing with transition to comfort focused care at this time.  Will discontinue interventions no longer focused on comfort such as IV fluids, antibiotics, and imaging.  Will instead  transition to a provide medications for pain, dyspnea, and anxiety.  -Priority for patient's care had been maintaining his dignity even in the setting of end-of-life.  -  Code Status: DNR  # Symptom management:      -Pain, in the  setting of relapsed multiple myeloma with metastatic disease to bone and lung                               -Start IV Dilaudid 0.3 mg/h continuous basal infusion.  Have ordered IV Dilaudid 0.5 mg every 30 minute bolus as needed via infusion.  Continue to adjust this based on patient's symptom response.   -Discontinue prior opioids such as IV fentanyl      -Anxiety/agitation, in the setting of end-of-life care                               -Start oral Ativan 1 mg every 4 hours as needed. Continue to adjust based on patient's symptom burden.                                 -Start IV Haldol 1 mg every 4 hours as needed. Continue to adjust based on patient's symptom burden.                   -Secretions, in the setting of end-of-life care                               -Start IV glycopyrrolate 0.2 mg every 4 hours as needed.  # Psycho-social/Spiritual Support:  - Support System: Daughter, grandson - Desire for further Chaplain support: Appreciate chaplain support  # Discharge Planning: Patient transition to comfort focused care at this time.  Anticipate hospital death based on current assessment.  Should patient stabilize appear appropriate for transfer, would consider inpatient hospice referral.  Thank you for allowing the palliative care team to participate in the care [name].  Jonathon Aus, DO Palliative Care Provider PMT # [phone]  If patient remains symptomatic despite maximum doses, please call PMT at [phone] between 0700 and 1900. Outside of these hours, please call attending, as PMT does not have night coverage.  *Please note that this is a verbal dictation therefore any spelling or grammatical errors are due to the "[employer]" system interpretation.   UPDATE: Visited later in day around 1600. Spoke with daughter, Jonathon [name], at bedside. Multiple family members present including patient's sisters. Offered emotional support. All are appropriately grieving and processing  of patient's medical status and impending death. Again mention may need transfer to inpatient hospice if stabilizes. Patient appears more comfortable at this time on dilaudid continuous infusion. Would continue to adjust based on patient's symptom burden and comfort and dignity at the end of life are the priority.

## 2022-08-12 NOTE — Assessment & Plan Note (Signed)
-   Evaluated by palliative care.  Currently DNR.  Daughter understands guarded prognosis at this time and patient has been doing poorly over the past 1 month leading up to hospitalization and not tolerating outpatient treatments as well as hoped -After further discussions on 08/12/2022 with palliative care and family, patient has been transitioned to comfort care

## 2022-08-12 NOTE — Assessment & Plan Note (Signed)
-   due to free water deficit and dehydration from poor intake - start D5W and trend BMP

## 2022-08-12 NOTE — Progress Notes (Signed)
SLP Cancellation Note  Patient Details Name: Jonathon [name] MRN: [name] DOB: Sep 15, 1935   Cancelled treatment:       Reason Eval/Treat Not Completed: Other (comment) (Spoke with RN who reported patient still very weak with voice seeming weaker today and goal today is his breathing. She also reported significant amount of thick and dry secretions removed during oral care on previous date. SLP will continue to follow)  Sonia Baller, MA, CCC-SLP Speech Therapy

## 2022-08-12 NOTE — Plan of Care (Signed)
  Problem: Education: Goal: Knowledge of General Education information will improve Description: Including pain rating scale, medication(s)/side effects and non-pharmacologic comfort measures Outcome: Progressing   Problem: Health Behavior/Discharge Planning: Goal: Ability to manage health-related needs will improve Outcome: Progressing   Problem: Clinical Measurements: Goal: Ability to maintain clinical measurements within normal limits will improve Outcome: Progressing   Problem: Clinical Measurements: Goal: Will remain free from infection Outcome: Progressing   Problem: Clinical Measurements: Goal: Cardiovascular complication will be avoided Outcome: Progressing   Problem: Activity: Goal: Risk for activity intolerance will decrease Outcome: Progressing

## 2022-08-12 NOTE — Progress Notes (Signed)
OT Cancellation Note  Patient Details Name: Jonathon [name] MRN: [id number] DOB: 09-28-1935   Cancelled Treatment:    Reason Eval/Treat Not Completed: Medical issues which prohibited therapy;Patient not medically ready. Spoke with Nursing who requests medical hold.   Julien Girt 08/12/2022, 8:19 AM

## 2022-08-12 NOTE — Progress Notes (Signed)
  Daily Progress Note   Patient Name: Jonathon [name]       Date: 08/12/2022 DOB: April 07, 1936  Age: 87 y.o. MRN#: [id number] Attending Physician: Dwyane Dee, MD Primary Care Physician: [name], MD Admit Date: 07/25/2022 Length of Stay: 4 days  Discussed care with primary hospitalist today. Patient's medical status worsening today. Will place full note as soon as able. Discussed care with daughter, transitioning to comfort focused care at this time. Orders updated accordingly.   [name], DO Palliative Care Provider PMT # [phone]

## 2022-08-12 NOTE — Progress Notes (Signed)
Nutrition Brief Note  RD consulted for tube feeding initiation earlier today.  Chart reviewed. Pt now transitioning to comfort care. Consult was d/c. No nutrition interventions planned at this time.    Clayton Bibles, MS, RD, LDN Inpatient Clinical Dietitian Contact information available via Amion

## 2022-08-12 NOTE — Progress Notes (Signed)
PT Cancellation Note  Patient Details Name: Jonathon [name] MRN: [name] DOB: April 12, 1936   Cancelled Treatment:    Reason Eval/Treat Not Completed: Medical issues which prohibited therapy. Will hold PT today per RN recommendation. Will check back as schedule allows. Thanks.    [facility]  Office: [phone]

## 2022-08-12 NOTE — Assessment & Plan Note (Addendum)
-   see cytopenia as well - s/p granix prior to transition to comfort care

## 2022-08-12 NOTE — Assessment & Plan Note (Signed)
-   Both arms noted with some edema however right arm has some erythema near AC fossa.  RUE duplex negative for DVT nor SVT performed on 08/10/2022 -Suspect severe hypoalbuminemia contributing to some third spacing and edema

## 2022-08-12 NOTE — Progress Notes (Signed)
Chaplain attempted twice to see Jonathon [name] and support family but Jonathon [name] was resting and family not present at those times.  Will attempt again tomorrow, but please also page Jonathon [name] as needs arise.  [address], [place] Pager, [phone]

## 2022-08-12 DEATH — deceased

## 2022-08-13 DIAGNOSIS — G9341 Metabolic encephalopathy: Secondary | ICD-10-CM | POA: Diagnosis not present

## 2022-08-13 DIAGNOSIS — Z7189 Other specified counseling: Secondary | ICD-10-CM | POA: Diagnosis not present

## 2022-08-13 DIAGNOSIS — R6521 Severe sepsis with septic shock: Secondary | ICD-10-CM | POA: Diagnosis not present

## 2022-08-13 DIAGNOSIS — A419 Sepsis, unspecified organism: Secondary | ICD-10-CM | POA: Diagnosis not present

## 2022-08-13 LAB — BPAM PLATELET PHERESIS
Blood Product Expiration Date: 202403312359
Blood Product Expiration Date: 202404012359
ISSUE DATE / TIME: 202403301233
Unit Type and Rh: 6200
Unit Type and Rh: 6200

## 2022-08-13 LAB — PREPARE PLATELET PHERESIS
Unit division: 0
Unit division: 0

## 2022-08-13 LAB — LEGIONELLA PNEUMOPHILA SEROGP 1 UR AG: L. pneumophila Serogp 1 Ur Ag: NEGATIVE

## 2022-08-13 MED ORDER — SCOPOLAMINE 1 MG/3DAYS TD PT72
1.0000 | MEDICATED_PATCH | TRANSDERMAL | Status: DC
  Administered 2022-08-13: 1.5 mg via TRANSDERMAL
  Filled 2022-08-13: qty 1

## 2022-08-13 NOTE — Progress Notes (Signed)
Progress Note    Jonathon [name]   [id number]  DOB: 1936-02-21  DOA: 08/04/2022     5 PCP: [name], MD  Initial CC: weakness  Hospital Course: Jonathon [name] is an 87 yo male with PMH MM with mets to bone/lung originally admitted to the ICU with weakness. He was treated for septic shock due to Pseudomonas bacteremia with IVF, pressors, and PRBC.  He was followed by palliative care and code status clarified to DNR. He had some medical improvement on treatments and service transferred to [facility] from ICU on 08/11/22.   Interval History:  No events overnight.  Family is at bedside this morning.  Patient transitioned to comfort care yesterday and has been resting more comfortably in bed. Non-interactive this morning.   Assessment and Plan: * Septic shock-resolved as of 08/11/2022 - Treated with vasopressors and ICU due to refractory hypotension - See Pseudomonas bacteremia as well -Patient transitioned to comfort care on 08/12/2022  Bacteremia due to Pseudomonas - see septic shock - unclear source, possibly pulm - does have pacer; no other hardware noted - repeated blood cultures for clearance (obtained 3/31)  Goals of care, counseling/discussion - Evaluated by palliative care.  Currently DNR.  Daughter understands guarded prognosis at this time and patient has been doing poorly over the past 1 month leading up to hospitalization and not tolerating outpatient treatments as well as hoped -After further discussions on 08/12/2022 with palliative care and family, patient has been transitioned to comfort care  Acute metabolic encephalopathy-resolved as of 08/11/2022 - CT head negative - etiology considered due to septic shock, now resolved  Severe neutropenia - see cytopenia as well - s/p granix prior to transition to comfort care  Hypernatremia - due to free water deficit and dehydration from poor intake  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 Renal US negative for hydronephrosis - no PO intake due to failing SLP evals, likely further dehydration - now on comfort care  Paroxysmal atrial fibrillation - continue comfort care  Dementia - continue comfort care  Redness and swelling of upper arm - Both arms noted with some edema however right arm has some erythema near [facility] fossa.  RUE duplex negative for DVT nor SVT performed on 08/10/2022 -Suspect severe hypoalbuminemia contributing to some third spacing and edema  Urinary retention - Continue Foley for comfort  BPH (benign prostatic hyperplasia) - Continue comfort care  Essential hypertension - Continue comfort care   Old records reviewed in assessment of this patient  Antimicrobials: Cefepime 3/28 >> current   DVT prophylaxis:     Code Status:   Code Status: DNR  Mobility Assessment (last 72 hours)     Mobility Assessment     Row Name 08/13/22 0900 08/12/22 2300         Does patient have an order for bedrest or is patient medically unstable Yes- Bedfast (Level 1) - Complete No - Continue assessment      What is the highest level of mobility based on the progressive mobility assessment? Level 1 (Bedfast) - Unable to balance while sitting on edge of bed Level 1 (Bedfast) - Unable to balance while sitting on edge of bed      Is the above level different from baseline mobility prior to current illness? No - Consider  discontinuing PT/OT No - Consider discontinuing PT/OT               Barriers to discharge:  Disposition Plan:  Inpatient comfort care Status is: Inpt  Objective: Blood pressure (!) 148/81, pulse (!) 155, temperature (!) 103 F (39.4 C), temperature source Oral, resp. rate 20, height 5\' 10"  (1.778 m), weight 88.4 kg, SpO2 97 %.   Examination:  Physical Exam Constitutional:      General: He is not in acute distress.    Appearance: Normal appearance.     Comments: Frail and deconditioned appearing  HENT:     Head: Normocephalic and atraumatic.     Mouth/Throat:     Mouth: Mucous membranes are moist.  Eyes:     Extraocular Movements: Extraocular movements intact.  Cardiovascular:     Rate and Rhythm: Regular rhythm. Tachycardia present.  Pulmonary:     Effort: Pulmonary effort is normal. No respiratory distress.     Breath sounds: Normal breath sounds. No wheezing.  Abdominal:     General: Bowel sounds are normal. There is no distension.     Palpations: Abdomen is soft.     Tenderness: There is no abdominal tenderness.  Musculoskeletal:        General: Swelling (B/L UE) present. Normal range of motion.     Cervical back: Normal range of motion and neck supple.  Skin:    General: Skin is warm and dry.     Comments: Erythema noted in R forearm  Neurological:     General: No focal deficit present.     Mental Status: He is alert.  Psychiatric:        Mood and Affect: Mood normal.        Behavior: Behavior normal.      Consultants:  Palliative care  Procedures:    Data Reviewed: No results found. However, due to the size of the patient record, not all encounters were searched. Please check Results Review for a complete set of results.   I have reviewed pertinent nursing notes, vitals, labs, and images as necessary. I have ordered labwork to follow up on as indicated.  I have reviewed the last notes from staff over past 24 hours. I have discussed patient's care plan and test results with nursing staff, CM/SW, and other staff as appropriate.    LOS: 5 days   Jonathon Dee, MD [facility] 08/13/2022, 3:47 PM

## 2022-08-13 NOTE — Consult Note (Signed)
   [facility] CM Inpatient Consult   08/13/2022  DIMAGGIO WINEY Dec 10, 1935 [id number]  [place] [facility] [ACO] Patient: Medicare [facility] Liaison remote coverage review for patient admitted to [facility]    Primary Care Provider:  [name], MD with  at [facility]   Patient screened for hospitalization with noted extreme high risk score for unplanned readmission risk and to assess for potential [place] Management service needs for post hospital transition for care coordination.  Review of patient's electronic medical record reveals patient is for comfort measures.   Plan:  Following -  no current need referral at this time anticipated  Of note, [place] does not replace or interfere with any arrangements made by the Inpatient Transition of Care team.  For questions contact:   Natividad Brood, RN BSN [place]  [phone] business mobile phone Toll free office [phone]  *[place]  [phone] Fax number: [phone] [email or web address]

## 2022-08-13 NOTE — Plan of Care (Signed)
  Problem: Education: Goal: Knowledge of General Education information will improve Description Including pain rating scale, medication(s)/side effects and non-pharmacologic comfort measures Outcome: Progressing   

## 2022-08-13 NOTE — Progress Notes (Signed)
Brief Hematology/Oncology Progress Note  Stopped by the patient room to talk with the patient's family.  Daughter and niece were at bedside.  Noted that I agree with the decision to transition to comfort base care due to his rapid decline in clinical status.  He has been made comfort care as of 08/12/2022.  Palliative care is involved.  They had the opportunity to ask any questions or concerns regarding his clinical status.  Oncology will continue to be available if there are any questions or concerns moving forward.  Appreciate the excellent care of the palliative care service.  Ledell Peoples, MD Department of Hematology/Oncology [place] at [facility] Phone: [phone] Pager: [phone] Email: [email or web address]

## 2022-08-13 NOTE — Progress Notes (Signed)
Chaplain met with Clenton's daughter, [name], to provide support, as well as his home care nurse, Joseph Art.  Chaplain provided reflective listening as [name] shared about her dad's life, those who love and care about him, and what was important to him.  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  She shared photos and stories about him.  The family is accepting of comfort measures only and are appreciative that he was able to state his wishes clearly.  Calub has several children and grandchildren as well as other family and dear friends who are taking turns and coming to spend time with him or speak to him on the phone so he can hear their voices.  Chaplain encouraged [name] to continue to care for herself while she is here.  Chaplain followed up with [name] later in the day to check on how she is doing.  She did not have any additional needs at this time, but please page as needs arise.  [address] [place] Pager, [phone]

## 2022-08-13 NOTE — Progress Notes (Signed)
Daily Progress Note   Patient Name: Jonathon [name]       Date: 08/13/2022 DOB: 1935/06/24  Age: 87 y.o. MRN#: [id number] Attending Physician: Dwyane Dee, MD Primary Care Physician: [name], MD Admit Date: 07/31/2022 Length of Stay: 5 days  Reason for Consultation/Follow-up: Establishing goals of care  Subjective:   CC: Patient continues in the dying process.   Spoke with daughter at bedside.     Subjective:  Reviewed EMR prior to presenting to bedside.    -  Continue comfort focused care at this time.    Objective:   Vital Signs:  BP (!) 152/86 (BP Location: Left Leg)   Pulse (!) 150   Temp 99.7 F (37.6 C) (Oral)   Resp (!) 32   Ht 5\' 10"  (1.778 m)   Wt 88.4 kg   SpO2 95%   BMI 27.96 kg/m   Physical Exam: General: laying in bed, moderate distress agonal breathing at times Eyes: No drainage noted HENT: Open mouth breathing, dry mucous membranes Cardiovascular: Tachycardia noted Respiratory: Increased work of breathing noted with accessory muscles use, respiratory rate over 30 at times Abdomen: distended Extremities: Edema present in all extremities from third spacing Skin: Ecchymoses present on the right extremity Neuro: Lethargic, minimally interactive to voice  Imaging: I personally reviewed recent imaging.   Assessment & Plan:   Assessment: Patient is an 87 year old male with a past medical history of tobacco use and relapsed multiple myeloma with metastatic disease to bone and lung who was admitted on 08/08/2022 from the cancer center for management of weakness and cytopenia. Patient transferred to the ICU once developed worsening mental status, hypotension, and fever. Since being admitted, patient has been receiving aggressive medical management for septic shock secondary to pseudomonal bacteremia with neutropenic fever. Patient also receiving support for pancytopenia in the setting of chemotherapy and multiple myeloma. Patient also receiving support  for AKI from ATN in the setting of sepsis. Palliative medicine team consulted to assist with complex medical decision making.   Recommendations/Plan: # Complex medical decision making/goals of care:  - Patient unable to participate in complex medical decision-making at this time due to medical status.  -Spoke with patient's NOK/daughter, [name], extensively about end of life signs and symptoms and dying process.     -Priority for patient's care had been maintaining his dignity even in the setting of end-of-life. Brief life review performed, patient was independent and liked gardening.    -  Code Status: DNR  # Symptom management:      -Pain, in the setting of relapsed multiple myeloma with metastatic disease to bone and lung                               -change IV Dilaudid from 0.3 mg/h to 0.5 mg/hour continuous basal infusion.  Also has IV Dilaudid 0.5 mg every 30 minute bolus as needed via infusion.  Continue to adjust this based on patient's symptom response.       -Anxiety/agitation, in the setting of end-of-life care                                 oral Ativan 1 mg every 4 hours as needed. Continue to adjust based on patient's symptom burden.  IV Haldol 1 mg every 4 hours as needed. Continue to adjust based on patient's symptom burden.                   -Secretions, in the setting of end-of-life care                             Add Scopolamine patch, also on  IV glycopyrrolate 0.2 mg every 4 hours as needed.  # Psycho-social/Spiritual Support:  - Support System: Daughter,   - Desire for further Chaplain support: Appreciate chaplain support  # Discharge Planning: Patient transition to comfort focused care at this time.  Anticipate hospital death based on current assessment.     Thank you for allowing the palliative care team to participate in the care [name].  Mod MDM Loistine Chance MD Palliative Care Provider PMT # [phone]  If  patient remains symptomatic despite maximum doses, please call PMT at [phone] between 0700 and 1900. Outside of these hours, please call attending, as PMT does not have night coverage.  *Please note that this is a verbal dictation therefore any spelling or grammatical errors are due to the "[employer] [employer]" system interpretation.

## 2022-08-14 DIAGNOSIS — Z515 Encounter for palliative care: Secondary | ICD-10-CM

## 2022-08-14 DIAGNOSIS — R6521 Severe sepsis with septic shock: Secondary | ICD-10-CM | POA: Diagnosis not present

## 2022-08-14 DIAGNOSIS — A419 Sepsis, unspecified organism: Secondary | ICD-10-CM | POA: Diagnosis not present

## 2022-08-14 MED ORDER — HYDROMORPHONE BOLUS VIA INFUSION
1.0000 mg | INTRAVENOUS | Status: DC | PRN
  Administered 2022-08-14 (×2): 1 mg via INTRAVENOUS

## 2022-08-14 MED ORDER — GLYCOPYRROLATE 0.2 MG/ML IJ SOLN
0.4000 mg | INTRAMUSCULAR | Status: DC | PRN
  Administered 2022-08-14 (×2): 0.4 mg via INTRAVENOUS
  Filled 2022-08-14 (×4): qty 2

## 2022-08-14 MED ORDER — HYDROMORPHONE HCL-NACL 50-0.9 MG/50ML-% IV SOLN: 1.5000 mg/h | INTRAVENOUS | Status: DC

## 2022-08-14 NOTE — Progress Notes (Signed)
PROGRESS NOTE    Jonathon [name]  [id number] DOB: 23-May-1935 DOA: 07/25/2022 PCP: [name], MD   Brief Narrative:  87 year old male with history of multiple myeloma with metastasis to bone/lung was originally admitted to ICU with weakness and was treated for septic shock due to Pseudomonas bacteremia with IV fluids, pressors and packed red cells transfusion.  He was followed by palliative care and CODE STATUS clarified to DNR.  He had some medical improvement on treatment and service transferred to [facility] from ICU on 08/11/2022.  His condition did not improve and patient was transitioned to comfort care measures only on 08/12/2022.    Assessment & Plan:   Comfort measures only Septic shock: Present on admission, resolved Pseudomonas bacteremia Severe neutropenia Acute metabolic encephalopathy Hypernatremia Cytopenia due to immunosuppressive agent Multiple myeloma Acute kidney injury Paroxysmal A-fib Dementia Redness and swelling of upper arm Urinary retention BPH Essential hypertension Hypoalbuminemia  Plan -As discussed above, continue comfort measures.  Currently on Dilaudid drip.  Palliative care following.  Expect in-hospital death.  DVT prophylaxis: None for comfort measures Code Status: DNR Family Communication: None at bedside Disposition Plan: Status is: Inpatient Remains inpatient appropriate because: Of severity of illness    Consultants: PCCM/palliative care  Procedures: None  Antimicrobials: None currently   Subjective: Patient seen and examined at bedside.  Almost unresponsive, tachypneic  Objective: Vitals:    0612  0804  0906  1039  BP:      Pulse:      Resp: (!) 40 (!) 29 (!) 30 (!) 30  Temp:      TempSrc:      SpO2:      Weight:      Height:        Intake/Output Summary (Last 24 hours) at  1059 Last data filed at  0500 Gross per 24 hour  Intake 10.52 ml  Output 1025 ml  Net -1014.48  ml   Filed Weights   08/09/22 0907  Weight: 88.4 kg    Examination:  General exam: Looks chronically ill and deconditioned.  Elderly male lying in bed.  Almost unresponsive; tachypneic   Data Reviewed: I have personally reviewed following labs and imaging studies  CBC: Recent Labs  Lab 08/08/22 1643 08/09/22 0354 08/10/22 0337 08/11/22 0517 08/12/22 0452  WBC 0.6* 0.4* 0.1* 0.1* 0.2*  NEUTROABS  --  0.2*  --  0.1* 0.1*  HGB 9.2* 8.9* 7.9* 7.0* 7.6*  HCT 28.7* 27.0* 23.4* 20.8* 22.3*  MCV 88.3 86.3 83.6 83.5 81.7  PLT 18* 14* 8* 24* 16*   Basic Metabolic Panel: Recent Labs  Lab 08/08/22 0334 08/08/22 0431 08/09/22 0354 08/10/22 0337 08/11/22 0517 08/12/22 0452  NA 133* 134* 133* 140 145 150*  K 5.2* 5.3* 4.4 3.5 3.2* 4.0  CL 103 105 110 116* 120* 126*  CO2 19* 17* 12* 14* 14* 9*  GLUCOSE 260* 240* 143* 115* 126* 163*  BUN 76* 79* 49* 42* 50* 67*  CREATININE 2.58* 2.65* 1.55* 1.38* 1.62* 2.09*  CALCIUM 7.7* 7.5* 6.9* 6.8* 6.7* 7.0*  MG 2.0  --  1.8  --  2.1 2.3  PHOS  --   --  3.7  --  2.9  --    GFR: Estimated Creatinine Clearance: 29.5 mL/min (A) (by C-G formula based on SCr of 2.09 mg/dL (H)). Liver Function Tests: Recent Labs  Lab 08/09/22 0354 08/10/22 0337 08/11/22 0517  AST 37 26 20  ALT 47* 49* 49*  ALKPHOS 51 52  54  BILITOT 1.5* 1.4* 2.0*  PROT 5.5* 5.2* 5.0*  ALBUMIN 3.0* 2.3* 2.2*   No results for input(s): "LIPASE", "AMYLASE" in the last 168 hours. No results for input(s): "AMMONIA" in the last 168 hours. Coagulation Profile: Recent Labs  Lab 08/08/22 0940  INR 2.3*   Cardiac Enzymes: No results for input(s): "CKTOTAL", "CKMB", "CKMBINDEX", "TROPONINI" in the last 168 hours. BNP (last 3 results) No results for input(s): "PROBNP" in the last 8760 hours. HbA1C: No results for input(s): "HGBA1C" in the last 72 hours. CBG: Recent Labs  Lab 08/11/22 2005 08/11/22 2341 08/12/22 0343 08/12/22 0716 08/12/22 1149  GLUCAP 121* 134*  137* 156* 146*   Lipid Profile: No results for input(s): "CHOL", "HDL", "LDLCALC", "TRIG", "CHOLHDL", "LDLDIRECT" in the last 72 hours. Thyroid Function Tests: No results for input(s): "TSH", "T4TOTAL", "FREET4", "T3FREE", "THYROIDAB" in the last 72 hours. Anemia Panel: No results for input(s): "VITAMINB12", "FOLATE", "FERRITIN", "TIBC", "IRON", "RETICCTPCT" in the last 72 hours. Sepsis Labs: Recent Labs  Lab 08/08/22 0334 08/08/22 0431 08/09/22 0354 08/09/22 1832  PROCALCITON  --  49.23  --   --   LATICACIDVEN 4.1*  --  4.2* 2.8*    Recent Results (from the past 240 hour(s))  Urine Culture (for pregnant, neutropenic or urologic patients or patients with an indwelling urinary catheter)     Status: None   Collection Time: 07/31/2022  5:17 PM   Specimen: Urine, Catheterized  Result Value Ref Range Status   Specimen Description   Final    URINE, CATHETERIZED Performed at [facility] [address] [place], [place] [address]    Special Requests   Final    Immunocompromised Performed at [facility], [place] [address] [place], [place] [address]    Culture   Final    NO GROWTH Performed at [facility], [place] [address] [place], [place] [address]    Report Status 08/08/2022 FINAL  Final  Culture, blood (Routine X 2) w Reflex to ID Panel     Status: Abnormal   Collection Time: 07/14/2022  5:51 PM   Specimen: BLOOD RIGHT ARM  Result Value Ref Range Status   Specimen Description   Final    BLOOD RIGHT ARM Performed at [facility], [place] [address] [place], [place] [address]    Special Requests   Final    BOTTLES DRAWN AEROBIC AND ANAEROBIC Blood Culture adequate volume Performed at [facility] [address] [place],  [address]    Culture  Setup Time   Final    GRAM NEGATIVE RODS AEROBIC BOTTLE ONLY CRITICAL RESULT CALLED TO, READ BACK BY AND VERIFIED WITH: PHARMD DREW WOFFORD ON 08/08/22 @ 1845 BY  DRT Performed at [facility], [place] [address], [place], [place] [address]    Culture PSEUDOMONAS AERUGINOSA (A)  Final   Report Status 08/10/2022 FINAL  Final   Organism ID, Bacteria PSEUDOMONAS AERUGINOSA  Final      Susceptibility   Pseudomonas aeruginosa - MIC*    CEFTAZIDIME <=1 SENSITIVE Sensitive     CIPROFLOXACIN <=0.25 SENSITIVE Sensitive     GENTAMICIN <=1 SENSITIVE Sensitive     IMIPENEM 2 SENSITIVE Sensitive     PIP/TAZO <=4 SENSITIVE Sensitive     CEFEPIME <=0.12 SENSITIVE Sensitive     * PSEUDOMONAS AERUGINOSA  Culture, blood (Routine X 2) w Reflex to ID Panel     Status: Abnormal   Collection Time: 07/19/2022  5:51 PM   Specimen: BLOOD  Result Value Ref Range Status   Specimen Description   Final    BLOOD SITE NOT SPECIFIED Performed at [facility], [place] [address], [place], [place] [address]    Special Requests   Final    BOTTLES DRAWN AEROBIC AND ANAEROBIC Blood Culture adequate volume Performed at [place] [address] [place], [place] [address]    Culture  Setup Time GRAM NEGATIVE RODS AEROBIC BOTTLE ONLY   Final   Culture (A)  Final    PSEUDOMONAS AERUGINOSA SUSCEPTIBILITIES PERFORMED ON PREVIOUS CULTURE WITHIN THE LAST 5 DAYS. Performed at [facility], [place] [address] [place], [place] [address]    Report Status 08/10/2022 FINAL  Final  Blood Culture ID Panel (Reflexed)     Status: Abnormal   Collection Time: 07/30/2022  5:51 PM  Result Value Ref Range Status   Enterococcus faecalis NOT DETECTED NOT DETECTED Final   Enterococcus Faecium NOT DETECTED NOT DETECTED Final   Listeria monocytogenes NOT DETECTED NOT DETECTED Final   Staphylococcus species NOT DETECTED NOT DETECTED Final   Staphylococcus aureus (BCID) NOT DETECTED NOT DETECTED Final   Staphylococcus epidermidis NOT DETECTED NOT DETECTED Final   Staphylococcus lugdunensis NOT DETECTED NOT DETECTED Final   Streptococcus species NOT DETECTED NOT DETECTED  Final   Streptococcus agalactiae NOT DETECTED NOT DETECTED Final   Streptococcus pneumoniae NOT DETECTED NOT DETECTED Final   Streptococcus pyogenes NOT DETECTED NOT DETECTED Final   A.calcoaceticus-baumannii NOT DETECTED NOT DETECTED Final   Bacteroides fragilis NOT DETECTED NOT DETECTED Final   Enterobacterales NOT DETECTED NOT DETECTED Final   Enterobacter cloacae complex NOT DETECTED NOT DETECTED Final   Escherichia coli NOT DETECTED NOT DETECTED Final   Klebsiella aerogenes NOT DETECTED NOT DETECTED Final   Klebsiella oxytoca NOT DETECTED NOT DETECTED Final   Klebsiella pneumoniae NOT DETECTED NOT DETECTED Final   Proteus species NOT DETECTED NOT DETECTED Final   Salmonella species NOT DETECTED NOT DETECTED Final   Serratia marcescens NOT DETECTED NOT DETECTED Final   Haemophilus influenzae NOT DETECTED NOT DETECTED Final   Neisseria meningitidis NOT DETECTED NOT DETECTED Final   Pseudomonas aeruginosa DETECTED (A) NOT DETECTED Final    Comment: CRITICAL RESULT CALLED TO, READ BACK BY AND VERIFIED WITH: PHARMD DREW WOFFORD ON 08/08/22 @ 1845 BY DRT    Stenotrophomonas maltophilia NOT DETECTED NOT DETECTED Final   Candida albicans NOT DETECTED NOT DETECTED Final   Candida auris NOT DETECTED NOT DETECTED Final   Candida glabrata NOT DETECTED NOT DETECTED Final   Candida krusei NOT DETECTED NOT DETECTED Final   Candida parapsilosis NOT DETECTED NOT DETECTED Final   Candida tropicalis NOT DETECTED NOT DETECTED Final   Cryptococcus neoformans/gattii NOT DETECTED NOT DETECTED Final   CTX-M ESBL NOT DETECTED NOT DETECTED Final   Carbapenem resistance IMP NOT DETECTED NOT DETECTED Final   Carbapenem resistance KPC NOT DETECTED NOT DETECTED Final   Carbapenem resistance NDM NOT DETECTED NOT DETECTED Final   Carbapenem resistance VIM NOT DETECTED NOT DETECTED Final    Comment: Performed at [facility], [address] [place],  [address]  MRSA Next Gen by PCR, Nasal      Status: None   Collection Time: 08/08/22  4:03 AM   Specimen: Nasal Mucosa; Nasal Swab  Result Value Ref Range Status   MRSA by PCR Next Gen NOT DETECTED NOT DETECTED Final    Comment: (NOTE) The GeneXpert MRSA  Assay (FDA approved for NASAL specimens only), is one component of a comprehensive MRSA colonization surveillance program. It is not intended to diagnose MRSA infection nor to guide or monitor treatment for MRSA infections. Test performance is not FDA approved in patients less than 83 years old. Performed at [facility], [place] [address] [place], [place] [address]   Culture, blood (Routine X 2) w Reflex to ID Panel     Status: None (Preliminary result)   Collection Time: 08/11/22 11:28 AM   Specimen: BLOOD  Result Value Ref Range Status   Specimen Description   Final    BLOOD BLOOD RIGHT ARM Performed at [facility] [address], [place], [place] [address]    Special Requests   Final    AEROBIC BOTTLE ONLY Blood Culture results may not be optimal due to an inadequate volume of blood received in culture bottles Performed at [facility] [address] [place], [place] [address]    Culture   Final    NO GROWTH 3 DAYS Performed at [facility], [place] [address] [place], [place] [address]    Report Status PENDING  Incomplete  Culture, blood (Routine X 2) w Reflex to ID Panel     Status: None (Preliminary result)   Collection Time: 08/11/22 11:28 AM   Specimen: BLOOD  Result Value Ref Range Status   Specimen Description   Final    BLOOD BLOOD LEFT HAND Performed at [facility] [address], [place], [place] [address]    Special Requests   Final    AEROBIC BOTTLE ONLY Blood Culture adequate volume Performed at [facility] [address] [place],  [address]    Culture   Final    NO GROWTH 3 DAYS Performed at [facility], [place] [address],  [place],  [address]    Report Status PENDING  Incomplete         Radiology Studies: No results found.      Scheduled Meds:  Chlorhexidine Gluconate Cloth  6 each Topical Daily   scopolamine  1 patch Transdermal Q72H   Continuous Infusions:  HYDROmorphone 0.5 mg/hr ( 0803)          Aline August, MD [facility] , 10:59 AM

## 2022-08-14 NOTE — Progress Notes (Signed)
    Patient Name: Jonathon [name]           DOB: 09-25-1935  MRN: [name]       [place]    Notified by RN that patient has expired at 2107.  2 RN verified. Patient was comfort care.   Family will be notified by RN.   Raenette Rover, DNP, [place]

## 2022-08-14 NOTE — Progress Notes (Signed)
Chaplain provided follow up support to Lashon's daughter, [name], and his nurse, Joseph Art. They are grateful for this time and especially grateful for all of the people who have come to see Carloyn Manner.  All of his children have come to be with him and all are of one accord and want to honor his wishes.  Chaplain will continue to provide support, but please also page as needs arise.  [address] [place] Pager, [phone]

## 2022-08-14 NOTE — Plan of Care (Signed)
  Problem: Pain Managment: Goal: General experience of comfort will improve Outcome: Progressing   

## 2022-08-14 NOTE — Progress Notes (Signed)
Daily Progress Note   Patient Name: Jonathon [name]       Date:  DOB: 10-26-1935  Age: 87 y.o. MRN#: [name] Attending Physician: Aline August, MD Primary Care Physician: [name], MD Admit Date: 07/16/2022  Reason for Consultation/Follow-up: goals of care in the setting of multiple myeloma and septic shock  Patient Profile/HPI: Patient is an 87 year old male with a past medical history of tobacco use and relapsed multiple myeloma with metastatic disease to bone and lung who was admitted on 08/08/2022 from the cancer center for management of weakness and cytopenia. Patient transferred to the ICU once developed worsening mental status, hypotension, and fever. He received aggressive medical management for septic shock secondary to pseudomonal bacteremia with neutropenic fever. Palliative medicine team consulted to assist with complex medical decision making. Family elected to for complete comfort care in the setting of continued decline despite interventions.  Subjective: Extensive medical record review and discussion with bedside nursing staff prior to visit.   Presented to bedside. Children have gone home for a short break and there is an extended relative at bedside talking on the phone. Patient is observed with eyes closed. With speech and gentle touch, eyelids are noted to twitch. Reassurances provided to patient that he is in a safe place and being well cared for.  Physical Exam Constitutional:      Appearance: He is ill-appearing.  Cardiovascular:     Rate and Rhythm: Tachycardia present.  Pulmonary:     Effort: Tachypnea and accessory muscle usage present.     Comments: Terminal secretions/congestion to back of throat. Lung sounds diminished throughout. Skin:     General: Skin is warm.     Comments: clammy  Neurological:     Comments: No eye opening, not following commands, localizes painful stimuli.            Vital Signs: BP (!) 93/56 (BP Location: Right Arm)   Pulse (!) 147   Temp (!) 103.1 F (39.5 C) (Oral)   Resp (!) 30   Ht 5\' 10"  (1.778 m)   Wt 88.4 kg   SpO2 96%   BMI 27.96 kg/m  SpO2: SpO2: 96 % O2 Device: O2 Device: Nasal Cannula O2 Flow Rate: O2 Flow Rate (L/min): 2 L/min  Intake/output summary:  Intake/Output Summary (Last 24 hours) at   [place] filed at  0500 Gross per 24 hour  Intake 10.52 ml  Output 1025 ml  Net -1014.48 ml   LBM: Last BM Date : 08/13/22 Baseline Weight: Weight: 88.4 kg Most recent weight: Weight: 88.4 kg       Palliative Assessment/Data: 10%      Patient Active Problem List   Diagnosis Date Noted   Comfort measures only status    Hypernatremia 08/12/2022   Severe neutropenia 08/12/2022   Redness and swelling of upper arm 08/12/2022   End of life care 08/12/2022   Bacteremia due to Pseudomonas 08/11/2022   Palliative care encounter 08/09/2022   High risk medication use 08/09/2022   Cancer associated pain 08/09/2022   DNR (do not resuscitate) 08/09/2022   Need for emotional support 08/09/2022   Goals of care, counseling/discussion 08/09/2022   Pressure injury of skin 08/08/2022   Weakness 08/10/2022   Cytopenia due to immunosupressive agent 08/05/2022   Paroxysmal atrial fibrillation 10/11/2021   Secondary hypercoagulable state 10/11/2021   Counseling regarding advance care planning and goals of care 04/29/2018   Multiple myeloma 04/29/2018   Malignant neoplasm metastatic to bone 04/14/2018   Hypercalcemia 04/14/2018   Lytic bone lesions on xray 03/29/2018   Displaced fracture of lateral end of right clavicle with routine healing 03/29/2018   Fall 09/05/2017   Sepsis secondary to UTI 09/05/2017   Urinary retention 09/05/2017   AKI (acute kidney  injury) 09/05/2017   CAD (coronary artery disease) 09/05/2017   Superficial vein thrombosis 08/19/2017   Altered mental status    Dementia 08/04/2017   Seizure 08/01/2017   Thiamine deficiency 05/27/2017   B12 deficiency 04/30/2017   Alcoholism 01/28/2017   Acute encephalopathy 12/28/2016   Hepatic steatosis 06/05/2016   Physical deconditioning 06/05/2016   Chronic diastolic congestive heart failure 06/05/2016   Orthostatic syncope    Elevated liver function tests    LOC (loss of consciousness) 08/10/2014   CIDP (chronic inflammatory demyelinating polyneuropathy) ([place]) 08/08/2014   Sustained SVT ([place]) 07/27/2014   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  Increase Dilaudid bolus dosing to 1 mg every 30 minutes as needed for break-through pain/dyspnea. Increase Robinul to 0.4 mg every 4 hours as needed for terminal secretions/congestion. Palliative will continue to follow for symptom management and family support.   Code Status: DNR  Prognosis:  Hours - Days  Discharge Planning: Anticipated Hospital Death  Care plan was discussed with bedside nursing staff and palliative care team.  Thank you for allowing the Palliative Medicine Team to assist in the care of this patient.  Mod MDM.  Signed: Loistine Chance MD   Please contact Palliative Medicine Team phone at [phone] for questions and concerns.

## 2022-08-15 ENCOUNTER — Telehealth: Payer: Self-pay | Admitting: Family Medicine

## 2022-08-16 LAB — CULTURE, BLOOD (ROUTINE X 2)
Culture: NO GROWTH
Culture: NO GROWTH
Special Requests: ADEQUATE

## 2022-08-21 ENCOUNTER — Inpatient Hospital Stay: Payer: Medicare Other | Admitting: Hematology

## 2022-08-21 ENCOUNTER — Encounter: Payer: Medicare Other | Admitting: Dietician

## 2022-08-21 ENCOUNTER — Inpatient Hospital Stay: Payer: Medicare Other

## 2022-09-04 ENCOUNTER — Other Ambulatory Visit: Payer: Medicare Other

## 2022-09-04 ENCOUNTER — Ambulatory Visit: Payer: Medicare Other

## 2022-09-04 ENCOUNTER — Encounter: Payer: Medicare Other | Admitting: Dietician

## 2022-09-11 NOTE — Progress Notes (Signed)
Patient on continues IV Dilaudid expired and remainder of 30 mls witnessed by Karrie Doffing RN. was discarded in [employer] .

## 2022-09-11 NOTE — Telephone Encounter (Signed)
Daughter called to inform MD of Pt's passing and to thank her for everything.

## 2022-09-11 NOTE — Death Summary Note (Signed)
Death Summary  Jonathon [name] [id number] DOB: 1936-01-24 DOA: 08-26-2022  PCP: Jonathon Ruddy, MD  Admit date: 08/26/22 Date of Death: September 02, 2022 Time of Death: 09-07-05   History of present illness:  87 year old male with history of multiple myeloma with metastasis to bone/lung was originally admitted to ICU with weakness and was treated for septic shock due to Pseudomonas bacteremia with IV fluids, pressors and packed red cells transfusion. He was followed by palliative care and CODE STATUS clarified to DNR. He had some medical improvement on treatment and service transferred to [facility] from ICU on 08/11/2022. His condition did not improve and patient was transitioned to comfort care measures only on 08/12/2022.  He expired on 02-Sep-2022 at September 07, 2105.  Final Diagnoses:  Comfort measures only Septic shock: Present on admission, resolved Pseudomonas bacteremia Severe neutropenia Acute metabolic encephalopathy Hypernatremia Cytopenia due to immunosuppressive agent Multiple myeloma Acute kidney injury Paroxysmal A-fib Dementia Redness and swelling of upper arm Urinary retention BPH Essential hypertension Hypoalbuminemia   The results of significant diagnostics from this hospitalization (including imaging, microbiology, ancillary and laboratory) are listed below for reference.    Significant Diagnostic Studies: VAS [place] UPPER EXTREMITY VENOUS DUPLEX  Result Date: 08/10/2022 UPPER VENOUS STUDY  Patient Name:  Jonathon [name] [name]  Date of Exam:   08/10/2022 Medical Rec #: [name]    Accession #:    [id number] Date of Birth: 1935/07/26    Patient Gender: M Patient Age:   55 years Exam Location:  [facility] Procedure:      VAS [place] UPPER EXTREMITY VENOUS DUPLEX Referring Phys: Baltazar Apo --------------------------------------------------------------------------------  Indications: Edema, Erythema, and Pain Comparison Study: No previous RUEV exams Performing Technologist: [name] RVT, RDMS   Examination Guidelines: A complete evaluation includes B-mode imaging, spectral Doppler, color Doppler, and power Doppler as needed of all accessible portions of each vessel. Bilateral testing is considered an integral part of a complete examination. Limited examinations for reoccurring indications may be performed as noted.  Right Findings: +----------+------------+---------+-----------+----------+--------------------+ RIGHT     CompressiblePhasicitySpontaneousProperties      Summary        +----------+------------+---------+-----------+----------+--------------------+ IJV           Full       Yes       Yes                                   +----------+------------+---------+-----------+----------+--------------------+ Subclavian               Yes       Yes                   patent by                                                              color/doppler     +----------+------------+---------+-----------+----------+--------------------+ Axillary      Full       Yes       Yes                                   +----------+------------+---------+-----------+----------+--------------------+ Brachial      Full  Yes       Yes                                   +----------+------------+---------+-----------+----------+--------------------+ Radial        Full                                                       +----------+------------+---------+-----------+----------+--------------------+ Ulnar         Full                                                       +----------+------------+---------+-----------+----------+--------------------+ Cephalic      Full                                                       +----------+------------+---------+-----------+----------+--------------------+ Basilic       Full       Yes       Yes                                   +----------+------------+---------+-----------+----------+--------------------+  Left  Findings: +----------+------------+---------+-----------+----------+--------------------+ LEFT      CompressiblePhasicitySpontaneousProperties      Summary        +----------+------------+---------+-----------+----------+--------------------+ Subclavian               Yes       Yes                    patentby                                                              color/doppler     +----------+------------+---------+-----------+----------+--------------------+  Summary:  Right: No evidence of deep vein thrombosis in the upper extremity. No evidence of superficial vein thrombosis in the upper extremity.  Left: No evidence of thrombosis in the subclavian.  *See table(s) above for measurements and observations.  Diagnosing physician: Harold Barban MD Electronically signed by Harold Barban MD on 08/10/2022 at 9:11:01 PM.    Final    DG CHEST PORT 1 VIEW  Result Date: 08/09/2022 CLINICAL DATA:  Sepsis EXAM: PORTABLE CHEST 1 VIEW COMPARISON:  CXR 08/08/22 FINDINGS: Left-sided dual lead cardiac device with unchanged lead positioning. Left-sided central venous catheter with tip near the confluence of brachiocephalic veins, unchanged from prior exam. No pleural effusion. No pneumothorax. Unchanged cardiac and mediastinal contours. Compared to prior exam there are new hazy airspace opacities in the left mid and lower lung fields, suspicious for infection. There are unchanged linear airspace opacities at the right lung apex. No radiographically apparent displaced rib fractures. Visualized upper abdomen is unremarkable. IMPRESSION: Compared to prior exam there are  new hazy airspace opacities in the left mid and lower lung fields, suspicious for infection. Electronically Signed   By: Marin Roberts M.D.   On: 08/09/2022 08:23   CT HEAD WO CONTRAST (5MM)  Result Date: 08/08/2022 CLINICAL DATA:  Nontraumatic altered mental status. EXAM: CT HEAD WITHOUT CONTRAST TECHNIQUE: Contiguous axial images were  obtained from the base of the skull through the vertex without intravenous contrast. RADIATION DOSE REDUCTION: This exam was performed according to the departmental dose-optimization program which includes automated exposure control, adjustment of the mA and/or kV according to patient size and/or use of iterative reconstruction technique. COMPARISON:  09/05/2017 FINDINGS: Brain: Ventricles and sulci appear symmetrical. No mass-effect or midline shift. No abnormal extra-axial fluid collections. Diffuse cerebral atrophy. Ventricular dilatation consistent with central atrophy. Low-attenuation changes in the deep white matter consistent with small vessel ischemia. Low-attenuation changes in the cerebellar hemispheres bilaterally likely representing old cerebellar infarcts. No acute intracranial hemorrhage. Vascular: Intracranial arterial calcifications. Skull: Multiple destructive bone lesions demonstrated throughout the calvarium with associated soft tissue mass in the anterior frontal vertex and large destructive lesion in the right temporal bone. Findings are new since the prior CT and likely represent metastatic lesions. Sinuses/Orbits: Opacification of the frontal sinuses, extending into the ethmoids. This is possibly inflammatory or metastatic. New since prior study. Old bowing deformity of the right medial orbital wall likely representing an old fracture deformity, unchanged. Right mastoid effusions. Other: None. IMPRESSION: 1. No acute intracranial abnormalities. Chronic atrophy and small vessel ischemic changes. 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 2. Multiple destructive bone lesions throughout the calvarium, some with soft tissue mass. Likely metastatic. Electronically Signed   By: Lucienne Capers M.D.   On: 08/08/2022 21:33   DG CHEST PORT 1 VIEW  Result Date: 08/08/2022 CLINICAL DATA:  Central line placement EXAM: PORTABLE CHEST 1 VIEW COMPARISON:  Chest x-ray dated August 07, 2022 FINDINGS: Interval placement of left IJ line with tip positioned at the junction of the left brachiocephalic vein and SVC. Left chest wall dual lead pacer with unchanged lead position. Cardiac and mediastinal contours are unchanged. Unchanged consolidations of the right lung apex and peripheral left upper lung. No pleural effusion or pneumothorax. IMPRESSION: Interval placement of left IJ line with tip positioned at the junction of the left brachiocephalic vein and SVC. Electronically Signed   By: Yetta Glassman M.D.   On: 08/08/2022 08:22   US RENAL  Result Date: 08/08/2022 CLINICAL DATA:  Acute kidney injury. EXAM: RENAL / URINARY TRACT ULTRASOUND COMPLETE COMPARISON:  CT abdomen and pelvis 09/30/2021 FINDINGS: Right Kidney: Renal measurements: 9.3 x 4.9 x 5.7 centimeters = volume: 137. mL. Echogenicity is normal. Parapelvic cyst is 2.5 centimeters. No hydronephrosis. Left Kidney: Renal measurements: 9.3 x 6.0 x 6.2 centimeters = volume: 182.2 mL. Echogenicity is normal. No hydronephrosis. Prominent column of Bertin is present and stable. No suspicious mass. Bladder: Not visualized Other: Study quality is degraded by patient's labored breathing and inability to cooperate with the exam. IMPRESSION: 1. No hydronephrosis. 2. RIGHT parapelvic cyst. 3. Bladder is not visualized. Electronically Signed   By: Nolon Nations M.D.   On: 08/08/2022 07:53   DG Chest 2 View  Result Date: 08/06/2022 CLINICAL DATA:  Weakness EXAM: CHEST - 2 VIEW COMPARISON:  None Available. FINDINGS: Left pacemaker is unchanged in position. The heart size and mediastinal contours are within normal limits. There are minimal patchy airspace opacities in the lateral left upper lobe. The lungs are otherwise  clear. There is no pleural effusion or pneumothorax. No acute fractures are seen. IMPRESSION: Minimal patchy airspace opacities in the lateral left upper lobe, which may be infectious or inflammatory. Electronically Signed   By:  Ronney Asters M.D.   On: 07/19/2022 21:34    Microbiology: Recent Results (from the past 240 hour(s))  Urine Culture (for pregnant, neutropenic or urologic patients or patients with an indwelling urinary catheter)     Status: None   Collection Time: 07/17/2022  5:17 PM   Specimen: Urine, Catheterized  Result Value Ref Range Status   Specimen Description   Final    URINE, CATHETERIZED Performed at [facility] [address], [place], [place] [address]    Special Requests   Final    Immunocompromised Performed at [facility], [place] [address], [place], [place] [address]    Culture   Final    NO GROWTH Performed at [facility], [place] [address] [place], [place] [address]    Report Status 08/08/2022 FINAL  Final  Culture, blood (Routine X 2) w Reflex to ID Panel     Status: Abnormal   Collection Time: 08/10/2022  5:51 PM   Specimen: BLOOD RIGHT ARM  Result Value Ref Range Status   Specimen Description   Final    BLOOD RIGHT ARM Performed at [facility], [place] [address], [place], [place] [address]    Special Requests   Final    BOTTLES DRAWN AEROBIC AND ANAEROBIC Blood Culture adequate volume Performed at [facility] [address] [place], [place] [address]    Culture  Setup Time   Final    GRAM NEGATIVE RODS AEROBIC BOTTLE ONLY CRITICAL RESULT CALLED TO, READ BACK BY AND VERIFIED WITH: PHARMD DREW WOFFORD ON 08/08/22 @ 1845 BY DRT Performed at [facility], [place] [address], [place], [place] [address]    Culture PSEUDOMONAS AERUGINOSA (A)  Final   Report Status 08/10/2022 FINAL  Final   Organism ID, Bacteria PSEUDOMONAS AERUGINOSA  Final      Susceptibility   Pseudomonas aeruginosa - MIC*    CEFTAZIDIME <=1 SENSITIVE Sensitive     CIPROFLOXACIN <=0.25 SENSITIVE Sensitive     GENTAMICIN <=1 SENSITIVE Sensitive     IMIPENEM 2 SENSITIVE Sensitive     PIP/TAZO <=4 SENSITIVE Sensitive     CEFEPIME <=0.12  SENSITIVE Sensitive     * PSEUDOMONAS AERUGINOSA  Culture, blood (Routine X 2) w Reflex to ID Panel     Status: Abnormal   Collection Time: 08/06/2022  5:51 PM   Specimen: BLOOD  Result Value Ref Range Status   Specimen Description   Final    BLOOD SITE NOT SPECIFIED Performed at [facility], [place] [address] [place], [place] [address]    Special Requests   Final    BOTTLES DRAWN AEROBIC AND ANAEROBIC Blood Culture adequate volume Performed at [facility] [address], [place], [place] [address]    Culture  Setup Time GRAM NEGATIVE RODS AEROBIC BOTTLE ONLY   Final   Culture (A)  Final    PSEUDOMONAS AERUGINOSA SUSCEPTIBILITIES PERFORMED ON PREVIOUS CULTURE WITHIN THE LAST 5 DAYS. Performed at [facility], [place] [address] [place],  [address]    Report Status 08/10/2022 FINAL  Final  Blood Culture ID Panel (Reflexed)     Status: Abnormal   Collection Time: 07/19/2022  5:51 PM  Result Value Ref Range  Status   Enterococcus faecalis NOT DETECTED NOT DETECTED Final   Enterococcus Faecium NOT DETECTED NOT DETECTED Final   Listeria monocytogenes NOT DETECTED NOT DETECTED Final   Staphylococcus species NOT DETECTED NOT DETECTED Final   Staphylococcus aureus (BCID) NOT DETECTED NOT DETECTED Final   Staphylococcus epidermidis NOT DETECTED NOT DETECTED Final   Staphylococcus lugdunensis NOT DETECTED NOT DETECTED Final   Streptococcus species NOT DETECTED NOT DETECTED Final   Streptococcus agalactiae NOT DETECTED NOT DETECTED Final   Streptococcus pneumoniae NOT DETECTED NOT DETECTED Final   Streptococcus pyogenes NOT DETECTED NOT DETECTED Final   A.calcoaceticus-baumannii NOT DETECTED NOT DETECTED Final   Bacteroides fragilis NOT DETECTED NOT DETECTED Final   Enterobacterales NOT DETECTED NOT DETECTED Final   Enterobacter cloacae complex NOT DETECTED NOT DETECTED Final   Escherichia coli NOT DETECTED NOT DETECTED Final   Klebsiella aerogenes NOT  DETECTED NOT DETECTED Final   Klebsiella oxytoca NOT DETECTED NOT DETECTED Final   Klebsiella pneumoniae NOT DETECTED NOT DETECTED Final   Proteus species NOT DETECTED NOT DETECTED Final   Salmonella species NOT DETECTED NOT DETECTED Final   Serratia marcescens NOT DETECTED NOT DETECTED Final   Haemophilus influenzae NOT DETECTED NOT DETECTED Final   Neisseria meningitidis NOT DETECTED NOT DETECTED Final   Pseudomonas aeruginosa DETECTED (A) NOT DETECTED Final    Comment: CRITICAL RESULT CALLED TO, READ BACK BY AND VERIFIED WITH: PHARMD DREW WOFFORD ON 08/08/22 @ 1845 BY DRT    Stenotrophomonas maltophilia NOT DETECTED NOT DETECTED Final   Candida albicans NOT DETECTED NOT DETECTED Final   Candida auris NOT DETECTED NOT DETECTED Final   Candida glabrata NOT DETECTED NOT DETECTED Final   Candida krusei NOT DETECTED NOT DETECTED Final   Candida parapsilosis NOT DETECTED NOT DETECTED Final   Candida tropicalis NOT DETECTED NOT DETECTED Final   Cryptococcus neoformans/gattii NOT DETECTED NOT DETECTED Final   CTX-M ESBL NOT DETECTED NOT DETECTED Final   Carbapenem resistance IMP NOT DETECTED NOT DETECTED Final   Carbapenem resistance KPC NOT DETECTED NOT DETECTED Final   Carbapenem resistance NDM NOT DETECTED NOT DETECTED Final   Carbapenem resistance VIM NOT DETECTED NOT DETECTED Final    Comment: Performed at [facility], [address], [place], [place] [address]  MRSA Next Gen by PCR, Nasal     Status: None   Collection Time: 08/08/22  4:03 AM   Specimen: Nasal Mucosa; Nasal Swab  Result Value Ref Range Status   MRSA by PCR Next Gen NOT DETECTED NOT DETECTED Final    Comment: (NOTE) The GeneXpert MRSA Assay (FDA approved for NASAL specimens only), is one component of a comprehensive MRSA colonization surveillance program. It is not intended to diagnose MRSA infection nor to guide or monitor treatment for MRSA infections. Test performance is not FDA approved in patients less  than 28 years old. Performed at [facility], [place] [address], [place], [place] [address]   Culture, blood (Routine X 2) w Reflex to ID Panel     Status: None (Preliminary result)   Collection Time: 08/11/22 11:28 AM   Specimen: BLOOD  Result Value Ref Range Status   Specimen Description   Final    BLOOD BLOOD RIGHT ARM Performed at [facility] [address] [place], [place] [address]    Special Requests   Final    AEROBIC BOTTLE ONLY Blood Culture results may not be optimal due to an inadequate volume of blood received in culture bottles  Performed at [facility], [place] [address] [place], [place] [address]    Culture   Final    NO GROWTH 4 DAYS Performed at [facility], [place] [address] [place], [place] [address]    Report Status PENDING  Incomplete  Culture, blood (Routine X 2) w Reflex to ID Panel     Status: None (Preliminary result)   Collection Time: 08/11/22 11:28 AM   Specimen: BLOOD  Result Value Ref Range Status   Specimen Description   Final    BLOOD BLOOD LEFT HAND Performed at [facility] [address] [place], [place] [address]    Special Requests   Final    AEROBIC BOTTLE ONLY Blood Culture adequate volume Performed at [facility] [address], [place], [place] [address]    Culture   Final    NO GROWTH 4 DAYS Performed at [facility], [place] [address], [place],  [address]    Report Status PENDING  Incomplete     Labs: Basic Metabolic Panel: Recent Labs  Lab 08/09/22 0354 08/10/22 0337 08/11/22 0517 08/12/22 0452  NA 133* 140 145 150*  K 4.4 3.5 3.2* 4.0  CL 110 116* 120* 126*  CO2 12* 14* 14* 9*  GLUCOSE 143* 115* 126* 163*  BUN 49* 42* 50* 67*  CREATININE 1.55* 1.38* 1.62* 2.09*  CALCIUM 6.9* 6.8* 6.7* 7.0*  MG 1.8  --  2.1 2.3  PHOS 3.7  --  2.9  --    Liver Function Tests: Recent Labs  Lab 08/09/22 0354  08/10/22 0337 08/11/22 0517  AST 37 26 20  ALT 47* 49* 49*  ALKPHOS 51 52 54  BILITOT 1.5* 1.4* 2.0*  PROT 5.5* 5.2* 5.0*  ALBUMIN 3.0* 2.3* 2.2*   No results for input(s): "LIPASE", "AMYLASE" in the last 168 hours. No results for input(s): "AMMONIA" in the last 168 hours. CBC: Recent Labs  Lab 08/08/22 1643 08/09/22 0354 08/10/22 0337 08/11/22 0517 08/12/22 0452  WBC 0.6* 0.4* 0.1* 0.1* 0.2*  NEUTROABS  --  0.2*  --  0.1* 0.1*  HGB 9.2* 8.9* 7.9* 7.0* 7.6*  HCT 28.7* 27.0* 23.4* 20.8* 22.3*  MCV 88.3 86.3 83.6 83.5 81.7  PLT 18* 14* 8* 24* 16*   Cardiac Enzymes: No results for input(s): "CKTOTAL", "CKMB", "CKMBINDEX", "TROPONINI" in the last 168 hours. D-Dimer No results for input(s): "DDIMER" in the last 72 hours. BNP: Invalid input(s): "POCBNP" CBG: Recent Labs  Lab 08/11/22 2005 08/11/22 2341 08/12/22 0343 08/12/22 0716 08/12/22 1149  GLUCAP 121* 134* 137* 156* 146*   Anemia work up No results for input(s): "VITAMINB12", "FOLATE", "FERRITIN", "TIBC", "IRON", "RETICCTPCT" in the last 72 hours. Urinalysis    Component Value Date/Time   COLORURINE YELLOW 08/09/2022 1134   APPEARANCEUR CLOUDY (A) 08/09/2022 1134   LABSPEC 1.013 08/09/2022 1134   PHURINE 5.0 08/09/2022 1134   GLUCOSEU 50 (A) 08/09/2022 1134   HGBUR LARGE (A) 08/09/2022 1134   BILIRUBINUR NEGATIVE 08/09/2022 1134   KETONESUR NEGATIVE 08/09/2022 1134   PROTEINUR 100 (A) 08/09/2022 1134   UROBILINOGEN 1.0 08/10/2014 1653   NITRITE NEGATIVE 08/09/2022 1134   LEUKOCYTESUR NEGATIVE 08/09/2022 1134   Sepsis Labs Recent Labs  Lab 08/09/22 0354 08/10/22 0337 08/11/22 0517 08/12/22 0452  WBC 0.4* 0.1* 0.1* 0.2*       SIGNED:  Aline August, MD  [facility] 08/23/2022, 10:57 AM

## 2022-09-11 DEATH — deceased

## 2022-09-18 ENCOUNTER — Ambulatory Visit: Payer: Medicare Other | Admitting: Hematology

## 2022-09-18 ENCOUNTER — Other Ambulatory Visit: Payer: Medicare Other

## 2022-09-18 ENCOUNTER — Ambulatory Visit: Payer: Medicare Other

## 2022-10-01 ENCOUNTER — Telehealth: Payer: Self-pay | Admitting: Neurology

## 2022-10-01 NOTE — Telephone Encounter (Signed)
Family member called in to let us know the pt passed away 09/07/22. She stated he was fond of Dr. Allena Katz and they wanted our office to be aware.

## 2022-10-02 ENCOUNTER — Other Ambulatory Visit: Payer: Medicare Other

## 2022-10-02 ENCOUNTER — Ambulatory Visit: Payer: Medicare Other

## 2022-10-03 NOTE — Telephone Encounter (Signed)
Personally called patient's daughter, Gillis Ends, and expressed condolences.

## 2022-10-08 ENCOUNTER — Other Ambulatory Visit: Payer: Medicare Other

## 2022-10-08 ENCOUNTER — Ambulatory Visit: Payer: Medicare Other

## 2022-10-08 ENCOUNTER — Ambulatory Visit: Payer: Medicare Other | Admitting: Hematology

## 2024-02-19 NOTE — Telephone Encounter (Signed)
 error
